# Patient Record
Sex: Female | Born: 1960
Health system: Southern US, Community
[De-identification: ages and names within clinical notes are randomized; demographics above are authoritative.]

## PROBLEM LIST (undated history)

## (undated) DIAGNOSIS — M4800 Spinal stenosis, site unspecified: Secondary | ICD-10-CM

## (undated) DIAGNOSIS — E785 Hyperlipidemia, unspecified: Secondary | ICD-10-CM

## (undated) DIAGNOSIS — H5410 Blindness, one eye, low vision other eye, unspecified eyes: Secondary | ICD-10-CM

## (undated) DIAGNOSIS — I10 Essential (primary) hypertension: Secondary | ICD-10-CM

## (undated) DIAGNOSIS — E039 Hypothyroidism, unspecified: Secondary | ICD-10-CM

## (undated) DIAGNOSIS — E1140 Type 2 diabetes mellitus with diabetic neuropathy, unspecified: Secondary | ICD-10-CM

## (undated) DIAGNOSIS — H547 Unspecified visual loss: Secondary | ICD-10-CM

## (undated) DIAGNOSIS — M1990 Unspecified osteoarthritis, unspecified site: Secondary | ICD-10-CM

## (undated) HISTORY — PX: BREAST BIOPSY: SHX20

---

## 1988-05-16 HISTORY — PX: ABDOMINAL HYSTERECTOMY: SHX81

## 1997-08-22 ENCOUNTER — Encounter: Admission: RE | Admit: 1997-08-22 | Discharge: 1997-08-22 | Payer: Self-pay | Admitting: Internal Medicine

## 1997-12-15 ENCOUNTER — Encounter: Admission: RE | Admit: 1997-12-15 | Discharge: 1997-12-15 | Payer: Self-pay | Admitting: Internal Medicine

## 1998-03-03 ENCOUNTER — Emergency Department (HOSPITAL_COMMUNITY): Admission: EM | Admit: 1998-03-03 | Discharge: 1998-03-03 | Payer: Self-pay | Admitting: Emergency Medicine

## 1998-03-18 ENCOUNTER — Ambulatory Visit (HOSPITAL_COMMUNITY): Admission: RE | Admit: 1998-03-18 | Discharge: 1998-03-18 | Payer: Self-pay | Admitting: Hematology and Oncology

## 1998-03-18 ENCOUNTER — Encounter: Admission: RE | Admit: 1998-03-18 | Discharge: 1998-03-18 | Payer: Self-pay | Admitting: Hematology and Oncology

## 1998-03-31 ENCOUNTER — Encounter: Admission: RE | Admit: 1998-03-31 | Discharge: 1998-03-31 | Payer: Self-pay | Admitting: Internal Medicine

## 1998-08-22 ENCOUNTER — Emergency Department (HOSPITAL_COMMUNITY): Admission: EM | Admit: 1998-08-22 | Discharge: 1998-08-22 | Payer: Self-pay | Admitting: *Deleted

## 1998-08-23 ENCOUNTER — Encounter: Payer: Self-pay | Admitting: Emergency Medicine

## 1998-08-25 ENCOUNTER — Encounter: Admission: RE | Admit: 1998-08-25 | Discharge: 1998-08-25 | Payer: Self-pay | Admitting: Hematology and Oncology

## 1998-09-01 ENCOUNTER — Encounter: Admission: RE | Admit: 1998-09-01 | Discharge: 1998-09-01 | Payer: Self-pay | Admitting: Internal Medicine

## 1998-09-08 ENCOUNTER — Encounter: Admission: RE | Admit: 1998-09-08 | Discharge: 1998-09-08 | Payer: Self-pay | Admitting: Hematology and Oncology

## 1998-09-15 HISTORY — PX: ENUCLEATION: SHX628

## 1998-10-20 ENCOUNTER — Encounter: Admission: RE | Admit: 1998-10-20 | Discharge: 1998-10-20 | Payer: Self-pay | Admitting: Internal Medicine

## 1998-10-23 ENCOUNTER — Encounter: Admission: RE | Admit: 1998-10-23 | Discharge: 1998-10-23 | Payer: Self-pay | Admitting: Internal Medicine

## 1998-11-05 ENCOUNTER — Encounter: Admission: RE | Admit: 1998-11-05 | Discharge: 1999-02-03 | Payer: Self-pay | Admitting: *Deleted

## 1998-11-13 ENCOUNTER — Encounter: Admission: RE | Admit: 1998-11-13 | Discharge: 1998-11-13 | Payer: Self-pay | Admitting: Internal Medicine

## 1999-01-22 ENCOUNTER — Emergency Department (HOSPITAL_COMMUNITY): Admission: EM | Admit: 1999-01-22 | Discharge: 1999-01-22 | Payer: Self-pay | Admitting: Emergency Medicine

## 1999-03-17 ENCOUNTER — Encounter: Admission: RE | Admit: 1999-03-17 | Discharge: 1999-03-17 | Payer: Self-pay | Admitting: Hematology and Oncology

## 1999-03-24 ENCOUNTER — Encounter: Admission: RE | Admit: 1999-03-24 | Discharge: 1999-03-24 | Payer: Self-pay | Admitting: Internal Medicine

## 1999-04-22 ENCOUNTER — Emergency Department (HOSPITAL_COMMUNITY): Admission: EM | Admit: 1999-04-22 | Discharge: 1999-04-22 | Payer: Self-pay | Admitting: Emergency Medicine

## 1999-07-15 ENCOUNTER — Encounter: Payer: Self-pay | Admitting: Emergency Medicine

## 1999-07-15 ENCOUNTER — Emergency Department (HOSPITAL_COMMUNITY): Admission: EM | Admit: 1999-07-15 | Discharge: 1999-07-15 | Payer: Self-pay | Admitting: Emergency Medicine

## 1999-07-23 ENCOUNTER — Emergency Department (HOSPITAL_COMMUNITY): Admission: EM | Admit: 1999-07-23 | Discharge: 1999-07-23 | Payer: Self-pay | Admitting: Emergency Medicine

## 1999-07-23 ENCOUNTER — Encounter: Payer: Self-pay | Admitting: Emergency Medicine

## 1999-07-25 ENCOUNTER — Emergency Department (HOSPITAL_COMMUNITY): Admission: EM | Admit: 1999-07-25 | Discharge: 1999-07-25 | Payer: Self-pay | Admitting: Emergency Medicine

## 1999-08-06 ENCOUNTER — Encounter: Admission: RE | Admit: 1999-08-06 | Discharge: 1999-08-06 | Payer: Self-pay | Admitting: Hematology and Oncology

## 1999-08-10 ENCOUNTER — Emergency Department (HOSPITAL_COMMUNITY): Admission: EM | Admit: 1999-08-10 | Discharge: 1999-08-10 | Payer: Self-pay | Admitting: Emergency Medicine

## 1999-08-20 ENCOUNTER — Encounter: Admission: RE | Admit: 1999-08-20 | Discharge: 1999-08-20 | Payer: Self-pay | Admitting: Internal Medicine

## 1999-09-15 ENCOUNTER — Emergency Department (HOSPITAL_COMMUNITY): Admission: EM | Admit: 1999-09-15 | Discharge: 1999-09-15 | Payer: Self-pay | Admitting: Emergency Medicine

## 1999-10-07 ENCOUNTER — Emergency Department (HOSPITAL_COMMUNITY): Admission: EM | Admit: 1999-10-07 | Discharge: 1999-10-07 | Payer: Self-pay | Admitting: Emergency Medicine

## 1999-11-01 ENCOUNTER — Emergency Department (HOSPITAL_COMMUNITY): Admission: EM | Admit: 1999-11-01 | Discharge: 1999-11-01 | Payer: Self-pay | Admitting: Emergency Medicine

## 1999-11-01 ENCOUNTER — Encounter: Payer: Self-pay | Admitting: Emergency Medicine

## 1999-11-29 ENCOUNTER — Ambulatory Visit (HOSPITAL_COMMUNITY): Admission: RE | Admit: 1999-11-29 | Discharge: 1999-11-30 | Payer: Self-pay | Admitting: Ophthalmology

## 1999-12-18 ENCOUNTER — Encounter: Payer: Self-pay | Admitting: Emergency Medicine

## 1999-12-18 ENCOUNTER — Emergency Department (HOSPITAL_COMMUNITY): Admission: EM | Admit: 1999-12-18 | Discharge: 1999-12-18 | Payer: Self-pay | Admitting: Emergency Medicine

## 2000-01-13 ENCOUNTER — Encounter: Admission: RE | Admit: 2000-01-13 | Discharge: 2000-01-13 | Payer: Self-pay | Admitting: Hematology and Oncology

## 2000-01-23 ENCOUNTER — Emergency Department (HOSPITAL_COMMUNITY): Admission: EM | Admit: 2000-01-23 | Discharge: 2000-01-23 | Payer: Self-pay | Admitting: Emergency Medicine

## 2000-01-31 ENCOUNTER — Encounter: Admission: RE | Admit: 2000-01-31 | Discharge: 2000-04-30 | Payer: Self-pay | Admitting: *Deleted

## 2000-02-08 ENCOUNTER — Encounter: Admission: RE | Admit: 2000-02-08 | Discharge: 2000-02-08 | Payer: Self-pay | Admitting: Internal Medicine

## 2000-02-18 ENCOUNTER — Emergency Department (HOSPITAL_COMMUNITY): Admission: EM | Admit: 2000-02-18 | Discharge: 2000-02-18 | Payer: Self-pay

## 2000-03-06 ENCOUNTER — Ambulatory Visit (HOSPITAL_COMMUNITY): Admission: RE | Admit: 2000-03-06 | Discharge: 2000-03-07 | Payer: Self-pay | Admitting: Ophthalmology

## 2000-03-20 ENCOUNTER — Ambulatory Visit (HOSPITAL_COMMUNITY): Admission: RE | Admit: 2000-03-20 | Discharge: 2000-03-21 | Payer: Self-pay | Admitting: Ophthalmology

## 2000-04-12 ENCOUNTER — Ambulatory Visit (HOSPITAL_COMMUNITY): Admission: RE | Admit: 2000-04-12 | Discharge: 2000-04-12 | Payer: Self-pay | Admitting: Hematology and Oncology

## 2000-04-12 ENCOUNTER — Encounter: Payer: Self-pay | Admitting: Hematology and Oncology

## 2000-04-12 ENCOUNTER — Encounter: Admission: RE | Admit: 2000-04-12 | Discharge: 2000-04-12 | Payer: Self-pay | Admitting: Hematology and Oncology

## 2000-05-16 ENCOUNTER — Emergency Department (HOSPITAL_COMMUNITY): Admission: EM | Admit: 2000-05-16 | Discharge: 2000-05-17 | Payer: Self-pay | Admitting: Emergency Medicine

## 2000-05-31 ENCOUNTER — Emergency Department (HOSPITAL_COMMUNITY): Admission: EM | Admit: 2000-05-31 | Discharge: 2000-06-01 | Payer: Self-pay | Admitting: Emergency Medicine

## 2000-06-01 ENCOUNTER — Encounter: Payer: Self-pay | Admitting: Emergency Medicine

## 2000-06-13 ENCOUNTER — Encounter: Admission: RE | Admit: 2000-06-13 | Discharge: 2000-06-13 | Payer: Self-pay | Admitting: Internal Medicine

## 2000-07-04 ENCOUNTER — Ambulatory Visit (HOSPITAL_COMMUNITY): Admission: RE | Admit: 2000-07-04 | Discharge: 2000-07-04 | Payer: Self-pay

## 2000-07-04 ENCOUNTER — Encounter: Admission: RE | Admit: 2000-07-04 | Discharge: 2000-07-04 | Payer: Self-pay

## 2000-08-30 ENCOUNTER — Emergency Department (HOSPITAL_COMMUNITY): Admission: EM | Admit: 2000-08-30 | Discharge: 2000-08-30 | Payer: Self-pay

## 2000-09-05 ENCOUNTER — Encounter: Admission: RE | Admit: 2000-09-05 | Discharge: 2000-09-05 | Payer: Self-pay | Admitting: Internal Medicine

## 2000-09-07 ENCOUNTER — Other Ambulatory Visit: Admission: RE | Admit: 2000-09-07 | Discharge: 2000-09-07 | Payer: Self-pay | Admitting: Obstetrics

## 2000-09-07 ENCOUNTER — Encounter: Admission: RE | Admit: 2000-09-07 | Discharge: 2000-09-07 | Payer: Self-pay | Admitting: Obstetrics

## 2000-09-19 ENCOUNTER — Ambulatory Visit (HOSPITAL_COMMUNITY): Admission: RE | Admit: 2000-09-19 | Discharge: 2000-09-19 | Payer: Self-pay

## 2000-12-25 ENCOUNTER — Encounter: Admission: RE | Admit: 2000-12-25 | Discharge: 2000-12-25 | Payer: Self-pay | Admitting: Internal Medicine

## 2000-12-26 ENCOUNTER — Encounter: Admission: RE | Admit: 2000-12-26 | Discharge: 2000-12-26 | Payer: Self-pay | Admitting: Internal Medicine

## 2000-12-28 ENCOUNTER — Encounter: Admission: RE | Admit: 2000-12-28 | Discharge: 2000-12-28 | Payer: Self-pay | Admitting: Internal Medicine

## 2001-01-11 ENCOUNTER — Encounter: Admission: RE | Admit: 2001-01-11 | Discharge: 2001-01-11 | Payer: Self-pay | Admitting: Internal Medicine

## 2001-01-12 ENCOUNTER — Ambulatory Visit: Admission: RE | Admit: 2001-01-12 | Discharge: 2001-01-12 | Payer: Self-pay

## 2001-01-17 ENCOUNTER — Encounter: Admission: RE | Admit: 2001-01-17 | Discharge: 2001-01-17 | Payer: Self-pay | Admitting: Internal Medicine

## 2001-03-09 ENCOUNTER — Emergency Department (HOSPITAL_COMMUNITY): Admission: EM | Admit: 2001-03-09 | Discharge: 2001-03-10 | Payer: Self-pay | Admitting: Emergency Medicine

## 2001-04-17 ENCOUNTER — Encounter: Admission: RE | Admit: 2001-04-17 | Discharge: 2001-04-17 | Payer: Self-pay

## 2001-05-03 ENCOUNTER — Encounter: Admission: RE | Admit: 2001-05-03 | Discharge: 2001-05-03 | Payer: Self-pay | Admitting: Internal Medicine

## 2001-07-22 ENCOUNTER — Emergency Department (HOSPITAL_COMMUNITY): Admission: EM | Admit: 2001-07-22 | Discharge: 2001-07-22 | Payer: Self-pay | Admitting: Emergency Medicine

## 2001-07-24 ENCOUNTER — Encounter: Admission: RE | Admit: 2001-07-24 | Discharge: 2001-07-24 | Payer: Self-pay | Admitting: Internal Medicine

## 2001-07-27 ENCOUNTER — Encounter: Admission: RE | Admit: 2001-07-27 | Discharge: 2001-07-27 | Payer: Self-pay | Admitting: Internal Medicine

## 2001-08-13 ENCOUNTER — Ambulatory Visit (HOSPITAL_COMMUNITY): Admission: RE | Admit: 2001-08-13 | Discharge: 2001-08-14 | Payer: Self-pay | Admitting: Ophthalmology

## 2001-08-13 ENCOUNTER — Encounter: Payer: Self-pay | Admitting: Ophthalmology

## 2001-09-25 ENCOUNTER — Encounter: Admission: RE | Admit: 2001-09-25 | Discharge: 2001-09-25 | Payer: Self-pay | Admitting: Internal Medicine

## 2001-09-25 ENCOUNTER — Ambulatory Visit (HOSPITAL_COMMUNITY): Admission: RE | Admit: 2001-09-25 | Discharge: 2001-09-25 | Payer: Self-pay | Admitting: Internal Medicine

## 2001-09-25 ENCOUNTER — Encounter: Payer: Self-pay | Admitting: Internal Medicine

## 2001-09-28 ENCOUNTER — Ambulatory Visit (HOSPITAL_COMMUNITY): Admission: RE | Admit: 2001-09-28 | Discharge: 2001-09-28 | Payer: Self-pay | Admitting: Internal Medicine

## 2001-10-09 ENCOUNTER — Encounter: Admission: RE | Admit: 2001-10-09 | Discharge: 2001-10-09 | Payer: Self-pay | Admitting: Internal Medicine

## 2001-10-09 ENCOUNTER — Ambulatory Visit (HOSPITAL_COMMUNITY): Admission: RE | Admit: 2001-10-09 | Discharge: 2001-10-09 | Payer: Self-pay | Admitting: Internal Medicine

## 2001-10-15 ENCOUNTER — Ambulatory Visit: Admission: RE | Admit: 2001-10-15 | Discharge: 2001-10-15 | Payer: Self-pay | Admitting: Internal Medicine

## 2001-10-15 ENCOUNTER — Encounter: Payer: Self-pay | Admitting: Cardiology

## 2001-10-19 ENCOUNTER — Encounter: Admission: RE | Admit: 2001-10-19 | Discharge: 2001-10-19 | Payer: Self-pay | Admitting: Internal Medicine

## 2001-10-22 ENCOUNTER — Ambulatory Visit (HOSPITAL_COMMUNITY): Admission: RE | Admit: 2001-10-22 | Discharge: 2001-10-22 | Payer: Self-pay | Admitting: Internal Medicine

## 2001-10-22 ENCOUNTER — Encounter: Payer: Self-pay | Admitting: Internal Medicine

## 2001-10-25 ENCOUNTER — Encounter: Admission: RE | Admit: 2001-10-25 | Discharge: 2001-10-25 | Payer: Self-pay | Admitting: Internal Medicine

## 2001-10-30 ENCOUNTER — Encounter: Payer: Self-pay | Admitting: Internal Medicine

## 2001-10-30 ENCOUNTER — Emergency Department (HOSPITAL_COMMUNITY): Admission: EM | Admit: 2001-10-30 | Discharge: 2001-10-31 | Payer: Self-pay | Admitting: *Deleted

## 2001-11-05 ENCOUNTER — Ambulatory Visit (HOSPITAL_COMMUNITY): Admission: RE | Admit: 2001-11-05 | Discharge: 2001-11-05 | Payer: Self-pay | Admitting: Internal Medicine

## 2001-11-14 ENCOUNTER — Encounter: Admission: RE | Admit: 2001-11-14 | Discharge: 2001-11-14 | Payer: Self-pay | Admitting: Internal Medicine

## 2001-11-27 ENCOUNTER — Encounter: Admission: RE | Admit: 2001-11-27 | Discharge: 2001-11-27 | Payer: Self-pay | Admitting: *Deleted

## 2001-11-27 ENCOUNTER — Other Ambulatory Visit: Admission: RE | Admit: 2001-11-27 | Discharge: 2001-11-27 | Payer: Self-pay | Admitting: *Deleted

## 2001-12-14 ENCOUNTER — Inpatient Hospital Stay (HOSPITAL_COMMUNITY): Admission: EM | Admit: 2001-12-14 | Discharge: 2001-12-17 | Payer: Self-pay | Admitting: *Deleted

## 2001-12-14 ENCOUNTER — Encounter: Payer: Self-pay | Admitting: *Deleted

## 2001-12-16 ENCOUNTER — Encounter: Payer: Self-pay | Admitting: Internal Medicine

## 2001-12-26 ENCOUNTER — Encounter: Admission: RE | Admit: 2001-12-26 | Discharge: 2001-12-26 | Payer: Self-pay | Admitting: Internal Medicine

## 2002-01-01 ENCOUNTER — Encounter: Admission: RE | Admit: 2002-01-01 | Discharge: 2002-01-01 | Payer: Self-pay | Admitting: Obstetrics and Gynecology

## 2002-01-01 ENCOUNTER — Encounter: Admission: RE | Admit: 2002-01-01 | Discharge: 2002-01-01 | Payer: Self-pay | Admitting: Internal Medicine

## 2002-01-09 ENCOUNTER — Ambulatory Visit (HOSPITAL_COMMUNITY): Admission: RE | Admit: 2002-01-09 | Discharge: 2002-01-09 | Payer: Self-pay | Admitting: Obstetrics and Gynecology

## 2002-01-16 ENCOUNTER — Encounter: Admission: RE | Admit: 2002-01-16 | Discharge: 2002-01-16 | Payer: Self-pay | Admitting: Internal Medicine

## 2002-01-20 ENCOUNTER — Emergency Department (HOSPITAL_COMMUNITY): Admission: EM | Admit: 2002-01-20 | Discharge: 2002-01-20 | Payer: Self-pay

## 2002-03-11 ENCOUNTER — Encounter: Admission: RE | Admit: 2002-03-11 | Discharge: 2002-03-11 | Payer: Self-pay | Admitting: Internal Medicine

## 2002-03-21 ENCOUNTER — Emergency Department (HOSPITAL_COMMUNITY): Admission: EM | Admit: 2002-03-21 | Discharge: 2002-03-21 | Payer: Self-pay

## 2002-04-25 ENCOUNTER — Ambulatory Visit (HOSPITAL_COMMUNITY): Admission: RE | Admit: 2002-04-25 | Discharge: 2002-04-25 | Payer: Self-pay | Admitting: Internal Medicine

## 2002-05-15 ENCOUNTER — Encounter: Admission: RE | Admit: 2002-05-15 | Discharge: 2002-05-15 | Payer: Self-pay | Admitting: Internal Medicine

## 2002-05-29 ENCOUNTER — Encounter: Admission: RE | Admit: 2002-05-29 | Discharge: 2002-08-27 | Payer: Self-pay | Admitting: *Deleted

## 2002-05-31 ENCOUNTER — Emergency Department (HOSPITAL_COMMUNITY): Admission: EM | Admit: 2002-05-31 | Discharge: 2002-05-31 | Payer: Self-pay | Admitting: Emergency Medicine

## 2002-06-28 ENCOUNTER — Encounter: Admission: RE | Admit: 2002-06-28 | Discharge: 2002-06-28 | Payer: Self-pay | Admitting: Internal Medicine

## 2002-07-26 ENCOUNTER — Emergency Department (HOSPITAL_COMMUNITY): Admission: EM | Admit: 2002-07-26 | Discharge: 2002-07-27 | Payer: Self-pay | Admitting: Emergency Medicine

## 2002-08-01 ENCOUNTER — Encounter: Admission: RE | Admit: 2002-08-01 | Discharge: 2002-08-01 | Payer: Self-pay | Admitting: Internal Medicine

## 2002-08-12 ENCOUNTER — Encounter: Admission: RE | Admit: 2002-08-12 | Discharge: 2002-08-12 | Payer: Self-pay | Admitting: Internal Medicine

## 2002-09-09 ENCOUNTER — Encounter: Admission: RE | Admit: 2002-09-09 | Discharge: 2002-09-09 | Payer: Self-pay | Admitting: Internal Medicine

## 2002-09-16 ENCOUNTER — Encounter: Admission: RE | Admit: 2002-09-16 | Discharge: 2002-09-16 | Payer: Self-pay | Admitting: Internal Medicine

## 2002-10-15 ENCOUNTER — Encounter: Admission: RE | Admit: 2002-10-15 | Discharge: 2002-10-15 | Payer: Self-pay | Admitting: Internal Medicine

## 2002-10-28 ENCOUNTER — Encounter: Admission: RE | Admit: 2002-10-28 | Discharge: 2002-10-28 | Payer: Self-pay | Admitting: Internal Medicine

## 2002-10-29 ENCOUNTER — Encounter: Admission: RE | Admit: 2002-10-29 | Discharge: 2002-10-29 | Payer: Self-pay | Admitting: Internal Medicine

## 2002-11-12 ENCOUNTER — Encounter
Admission: RE | Admit: 2002-11-12 | Discharge: 2002-11-13 | Payer: Self-pay | Admitting: Physical Medicine & Rehabilitation

## 2002-11-14 ENCOUNTER — Encounter: Admission: RE | Admit: 2002-11-14 | Discharge: 2003-02-12 | Payer: Self-pay | Admitting: *Deleted

## 2002-12-12 ENCOUNTER — Emergency Department (HOSPITAL_COMMUNITY): Admission: EM | Admit: 2002-12-12 | Discharge: 2002-12-12 | Payer: Self-pay | Admitting: Emergency Medicine

## 2002-12-21 ENCOUNTER — Emergency Department (HOSPITAL_COMMUNITY): Admission: EM | Admit: 2002-12-21 | Discharge: 2002-12-21 | Payer: Self-pay | Admitting: Emergency Medicine

## 2002-12-21 ENCOUNTER — Encounter: Payer: Self-pay | Admitting: Emergency Medicine

## 2003-03-22 ENCOUNTER — Emergency Department (HOSPITAL_COMMUNITY): Admission: EM | Admit: 2003-03-22 | Discharge: 2003-03-22 | Payer: Self-pay | Admitting: Emergency Medicine

## 2003-06-30 ENCOUNTER — Encounter: Admission: RE | Admit: 2003-06-30 | Discharge: 2003-06-30 | Payer: Self-pay | Admitting: Internal Medicine

## 2003-08-05 ENCOUNTER — Encounter: Admission: RE | Admit: 2003-08-05 | Discharge: 2003-08-05 | Payer: Self-pay | Admitting: Internal Medicine

## 2003-08-23 ENCOUNTER — Emergency Department (HOSPITAL_COMMUNITY): Admission: AD | Admit: 2003-08-23 | Discharge: 2003-08-23 | Payer: Self-pay | Admitting: Family Medicine

## 2003-09-18 ENCOUNTER — Other Ambulatory Visit: Admission: RE | Admit: 2003-09-18 | Discharge: 2003-09-18 | Payer: Self-pay | Admitting: Obstetrics and Gynecology

## 2003-09-22 ENCOUNTER — Encounter: Admission: RE | Admit: 2003-09-22 | Discharge: 2003-09-22 | Payer: Self-pay | Admitting: Obstetrics and Gynecology

## 2003-11-03 ENCOUNTER — Encounter (INDEPENDENT_AMBULATORY_CARE_PROVIDER_SITE_OTHER): Payer: Self-pay | Admitting: Specialist

## 2003-11-03 ENCOUNTER — Inpatient Hospital Stay (HOSPITAL_COMMUNITY): Admission: RE | Admit: 2003-11-03 | Discharge: 2003-11-08 | Payer: Self-pay | Admitting: Obstetrics and Gynecology

## 2004-01-17 IMAGING — CT CT ABDOMEN W/ CM
1 of 2 series · 13 of 32 positions shown, 18 images · IV contrast (150 ML OMNI 300)
Comparison: none

FINDINGS
CLINICAL DATA: CHEST PAIN.
CT OF THE CHEST, ABDOMEN AND PELVIS WITH CONTRAST
NO COMPARISON.
AFTER THE INTRAVENOUS INJECTION OF 667cc OF OMNIPAQUE 300, SPIRAL IMAGES WERE OBTAINED THROUGH THE
CHEST, ABDOMEN AND PELVIS.  THE PATIENT HAS A HISTORY OF SEVERE DEBILITATION.
CT OF THE CHEST - 12/14/2001 - (1501 HOURS)
WITHIN THE CHEST, THE VASCULATURE IS WITHIN NORMAL LIMITS.  THERE IS NO EVIDENCE OF AN AORTIC
DISSECTION.  THE HEART IS NORMAL IN SIZE.  WITHIN THE MEDIASTINUM, THERE IS NO EVIDENCE OF ABNORMAL
ADENOPATHY.
ON LUNG WINDOWS, NO ABNORMAL PARENCHYMAL OPACITIES ARE IDENTIFIED.  NO PNEUMOTHORACES OR EFFUSIONS
ARE SEEN.  A CALCIFICATION IS SEEN IN THE RIGHT BREAST, PROBABLY REFLECTING A FIBROADENOMA.
IMPRESSION
NO EVIDENCE OF THORACIC AORTIC DISSECTION.
CT OF THE ABDOMEN - 12/14/2001 - (1501 HOURS)
THE LEFT HEPATIC ARTERY IS SEEN EXTENDING FROM THE LEFT GASTRIC INTO THE LIGAMENTUM VENOSUM REGION,
COMPATIBLE WITH NORMAL VARIATION OF THE LEFT HEPATIC ARTERY.  THE LIVER IS WITHIN NORMAL LIMITS.
THE SPLEEN, PANCREAS, KIDNEYS AND ADRENAL GLANDS ARE WITHIN NORMAL LIMITS.
THERE IS NO EVIDENCE OF FREE FLUID OR ADENOPATHY IN THE ABDOMEN.
ON THE ARTERIAL PHASE IMAGES, SWELLING OF  CONTRAST MIXED WITH LOW DENSITY IS SEEN IN THE SPLENIC
VEIN.  THIS RESOLVES ON DELAYED IMAGES AND IS BELIEVED TO REPRESENT MIXING OF CONTRAST RATHER THAN
THROMBUS.
NO EVIDENCE OF ACUTE ABNORMALITY IN THE ABDOMEN.  VARIATION ARTERIAL ANATOMY OF THE LIVER IS NOTED.
CT OF THE  PELVIS - 12/14/2001 - (1501 HOURS)
A LARGE HYPERVASCULAR MASS IS SEEN IN THE LEFT LOWER QUADRANT OF THE ABDOMEN THAT IS BELIEVED TO BE
IN CONTINUITY WITH THE UTERUS.  THIS PROBABLY REFLECTS A FIBROMATOID UTERUS.  THIS CAUSES MASS
EFFECT UPON THE BLADDER TO THE RIGHT.   THERE IS NO EVIDENCE OF URETERAL OBSTRUCTION.  THE BLADDER
IS DISTENDED.   THE VASCULATURE IS WITHIN NORMAL LIMITS WITHOUT EVIDENCE OF DISSECTION.  THERE IS
NO EVIDENCE OF FREE FLUID OR ADENOPATHY.
ON IMAGES #221 THROUGH #224, THERE IS SOME ASYMMETRIC ENLARGEMENT OF THE RIGHT PIRIFORMIS MUSCLE.
1.  NO EVIDENCE OF DISSECTION.
2.  LARGE PELVIC AND ABDOMINAL MASS AS DESCRIBED.  THIS IS BELIEVED TO REPRESENT AN ENLARGED
UTERUS.  HOWEVER, ULTRASOUND IS RECOMMENDED TO FURTHER DELINEATE PATHOLOGY.
3.  THERE IS SOME ASYMMETRY OF THE RIGHT PIRIFORMIS MUSCLE WITH ENLARGMENT ON THE RIGHT.  THIS MAY
SIMPLY REFLECT ASYMMETRIC HYPERTROPHY OF THE RIGHT PIRIFORMIS, MASS EFFECT CAN NOT BE EXCLUDED.
MRI CAN BE PERFORMED TO FURTHER DELINEATE PATHOLOGY.
THESE FINDINGS WERE DISCUSSED WITH DR. SOLTAN

[Series 2: aortic dissection · axial · 0.70mm/px · z∈[-471,-36]mm · 13 of 235 slices shown, 18 images]
[im 13/235  soft-tissue]
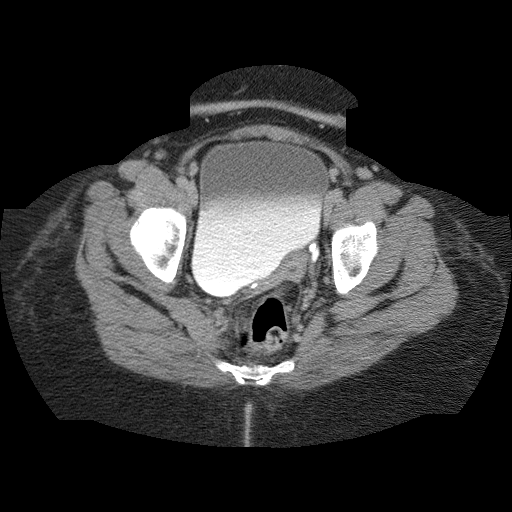
[im 13/235  bone]
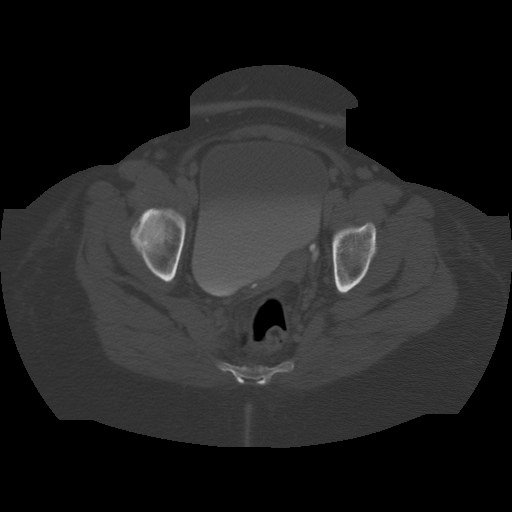
[im 37/235  soft-tissue]
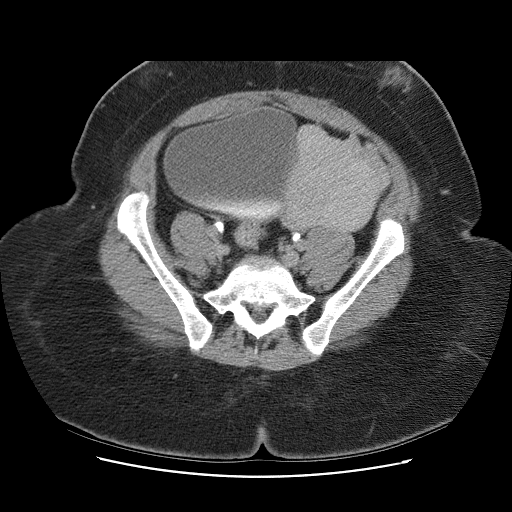
[im 50/235  soft-tissue]
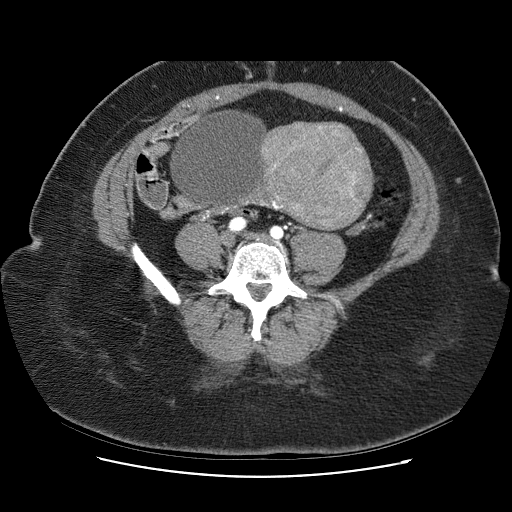
[im 74/235  soft-tissue]
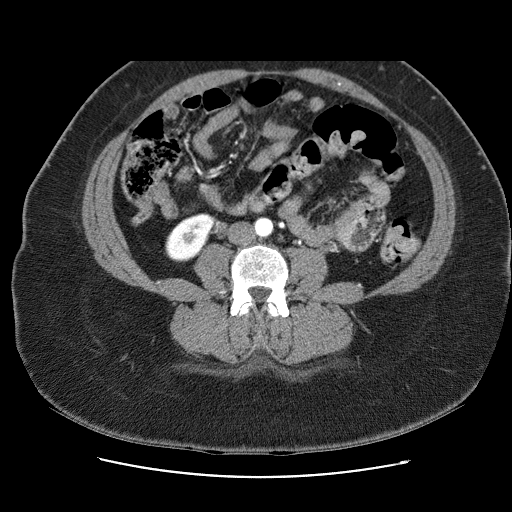
[im 87/235  soft-tissue]
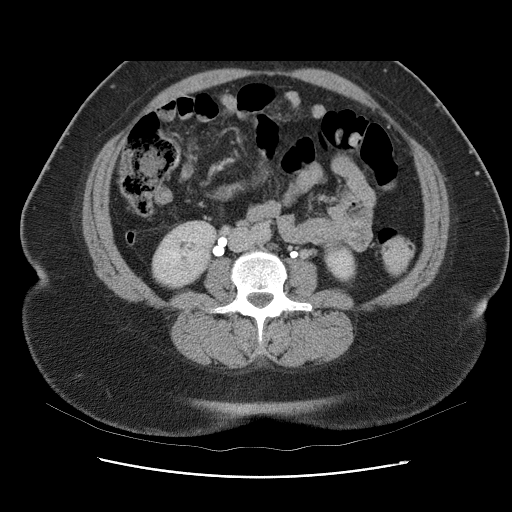
[im 111/235  soft-tissue]
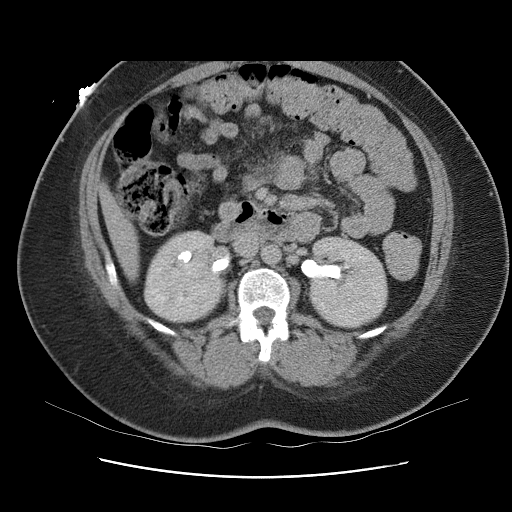
[im 124/235  soft-tissue]
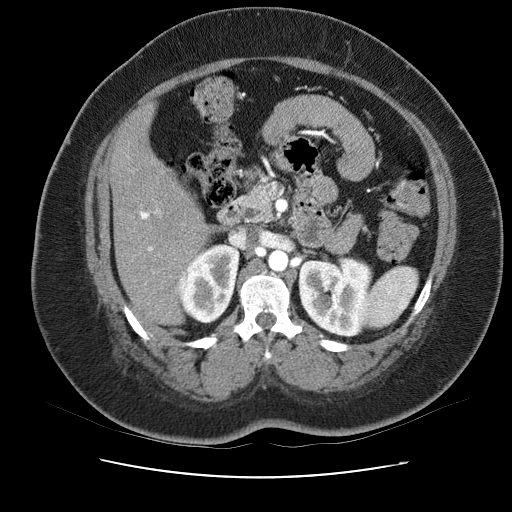
[im 148/235  soft-tissue]
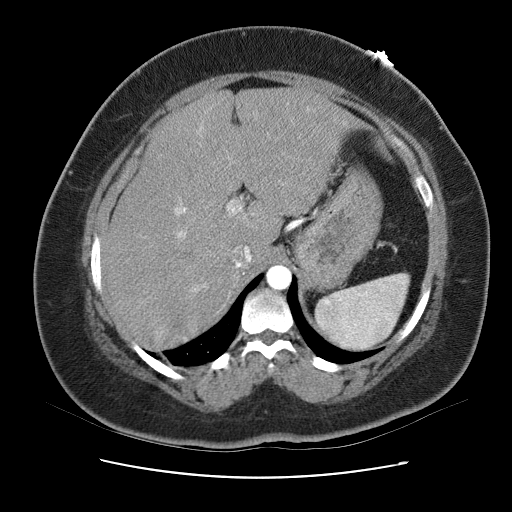
[im 161/235  soft-tissue]
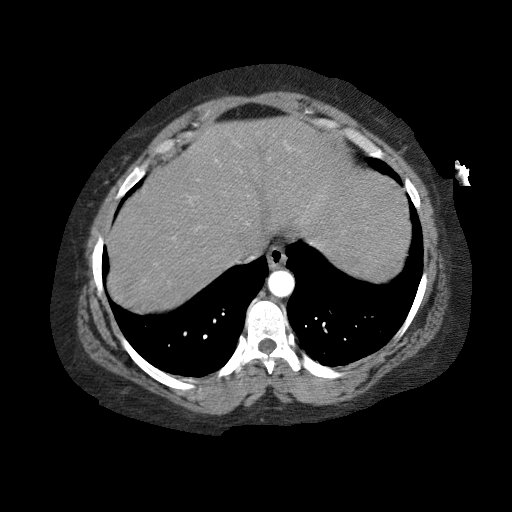
[im 161/235  bone]
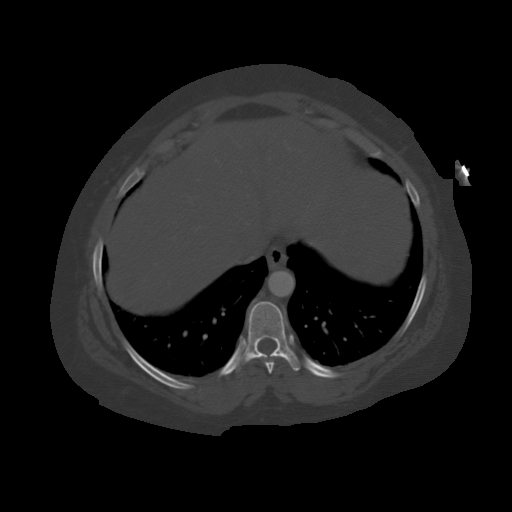
[im 185/235  soft-tissue]
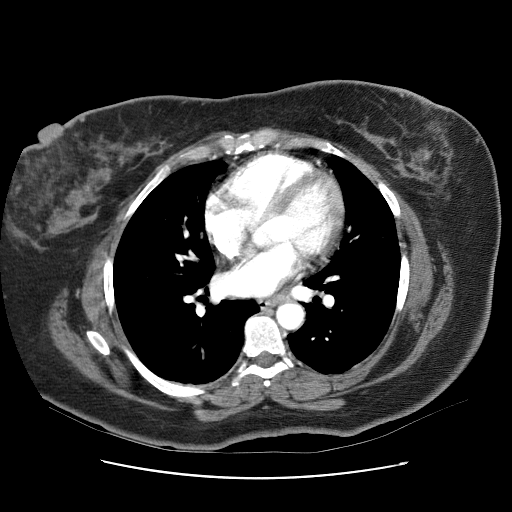
[im 185/235  lung]
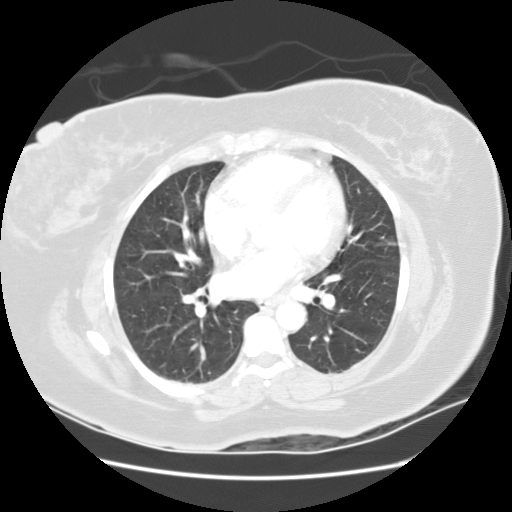
[im 198/235  soft-tissue]
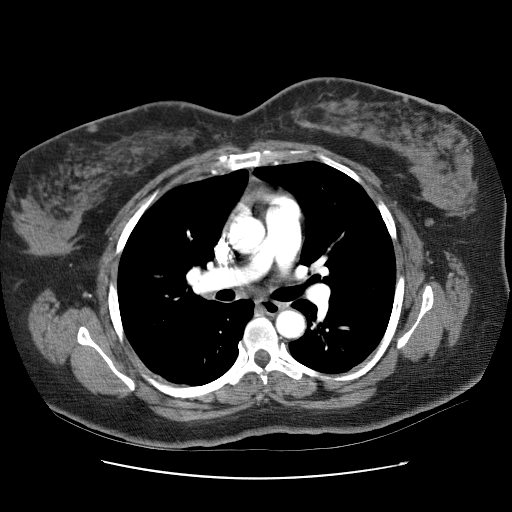
[im 198/235  lung]
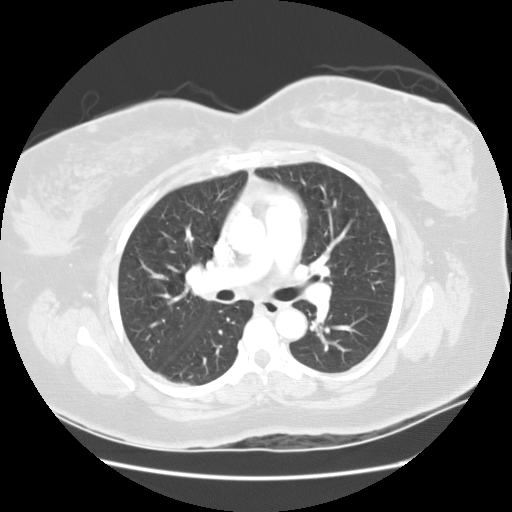
[im 210/235  lung]
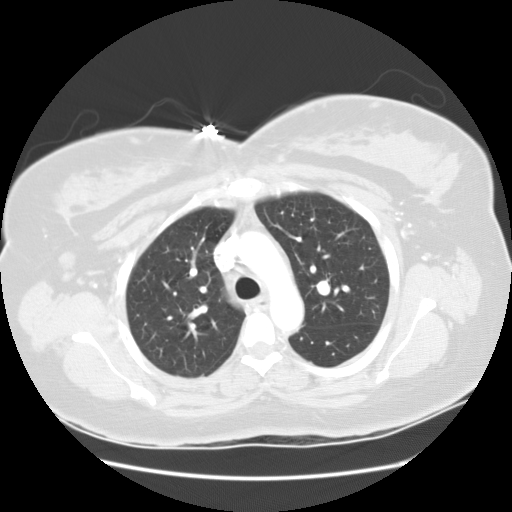
[im 222/235  soft-tissue]
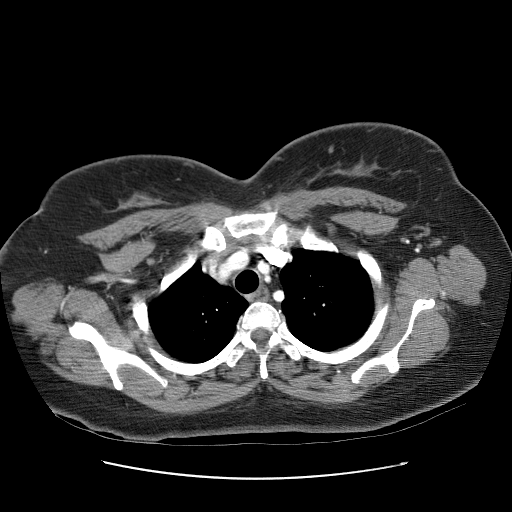
[im 222/235  lung]
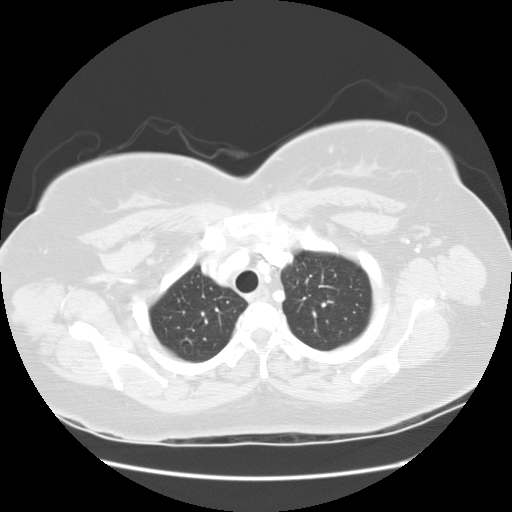

[13 of 32 positions shown; findings below may reference images not displayed]

## 2004-04-07 ENCOUNTER — Inpatient Hospital Stay (HOSPITAL_COMMUNITY): Admission: EM | Admit: 2004-04-07 | Discharge: 2004-04-14 | Payer: Self-pay | Admitting: Emergency Medicine

## 2004-06-26 ENCOUNTER — Emergency Department (HOSPITAL_COMMUNITY): Admission: EM | Admit: 2004-06-26 | Discharge: 2004-06-27 | Payer: Self-pay | Admitting: Emergency Medicine

## 2004-07-23 ENCOUNTER — Emergency Department (HOSPITAL_COMMUNITY): Admission: EM | Admit: 2004-07-23 | Discharge: 2004-07-23 | Payer: Self-pay | Admitting: Emergency Medicine

## 2004-08-02 ENCOUNTER — Encounter: Admission: RE | Admit: 2004-08-02 | Discharge: 2004-08-02 | Payer: Self-pay | Admitting: Internal Medicine

## 2004-09-14 ENCOUNTER — Emergency Department (HOSPITAL_COMMUNITY): Admission: EM | Admit: 2004-09-14 | Discharge: 2004-09-14 | Payer: Self-pay | Admitting: Family Medicine

## 2004-10-07 ENCOUNTER — Inpatient Hospital Stay (HOSPITAL_COMMUNITY): Admission: EM | Admit: 2004-10-07 | Discharge: 2004-10-12 | Payer: Self-pay | Admitting: Emergency Medicine

## 2004-11-01 ENCOUNTER — Encounter: Admission: RE | Admit: 2004-11-01 | Discharge: 2004-11-01 | Payer: Self-pay | Admitting: Family Medicine

## 2005-01-01 ENCOUNTER — Inpatient Hospital Stay (HOSPITAL_COMMUNITY): Admission: EM | Admit: 2005-01-01 | Discharge: 2005-01-03 | Payer: Self-pay | Admitting: Emergency Medicine

## 2005-01-26 ENCOUNTER — Encounter: Admission: RE | Admit: 2005-01-26 | Discharge: 2005-01-26 | Payer: Self-pay | Admitting: Family Medicine

## 2005-02-11 ENCOUNTER — Ambulatory Visit (HOSPITAL_COMMUNITY): Admission: RE | Admit: 2005-02-11 | Discharge: 2005-02-11 | Payer: Self-pay | Admitting: Gastroenterology

## 2005-02-12 ENCOUNTER — Emergency Department (HOSPITAL_COMMUNITY): Admission: EM | Admit: 2005-02-12 | Discharge: 2005-02-12 | Payer: Self-pay | Admitting: Family Medicine

## 2005-02-16 ENCOUNTER — Encounter: Admission: RE | Admit: 2005-02-16 | Discharge: 2005-05-17 | Payer: Self-pay | Admitting: Family Medicine

## 2005-03-07 ENCOUNTER — Emergency Department (HOSPITAL_COMMUNITY): Admission: EM | Admit: 2005-03-07 | Discharge: 2005-03-07 | Payer: Self-pay | Admitting: Emergency Medicine

## 2005-05-03 ENCOUNTER — Emergency Department (HOSPITAL_COMMUNITY): Admission: EM | Admit: 2005-05-03 | Discharge: 2005-05-03 | Payer: Self-pay | Admitting: Emergency Medicine

## 2005-09-07 ENCOUNTER — Emergency Department (HOSPITAL_COMMUNITY): Admission: EM | Admit: 2005-09-07 | Discharge: 2005-09-07 | Payer: Self-pay | Admitting: Emergency Medicine

## 2005-09-07 IMAGING — US US PELVIS COMPLETE MODIFY
1 series · 14 of 25 positions shown · non-contrast
Comparison: none

CLINICAL DATA: Pelvic pain and bloating.  History of fibroids.
 ULTRASOUND OF THE PELVIS COMPLETE TRANSABDOMINAL AND TRANSVAGINAL

[Series 1: unknown · 0.32mm/px · 14 of 30 slices shown]
[im 1/30]
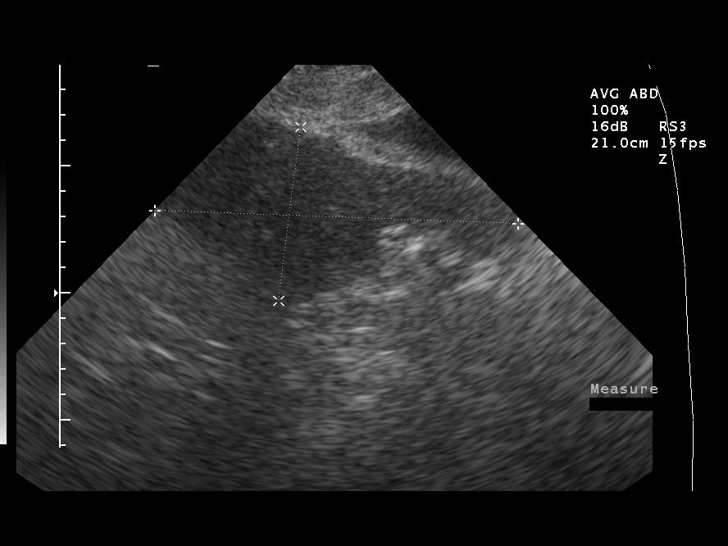
[im 3/30]
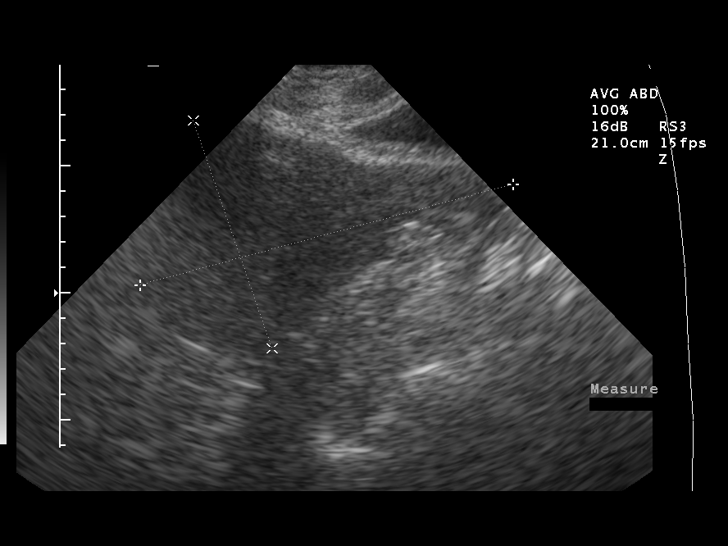
[im 5/30]
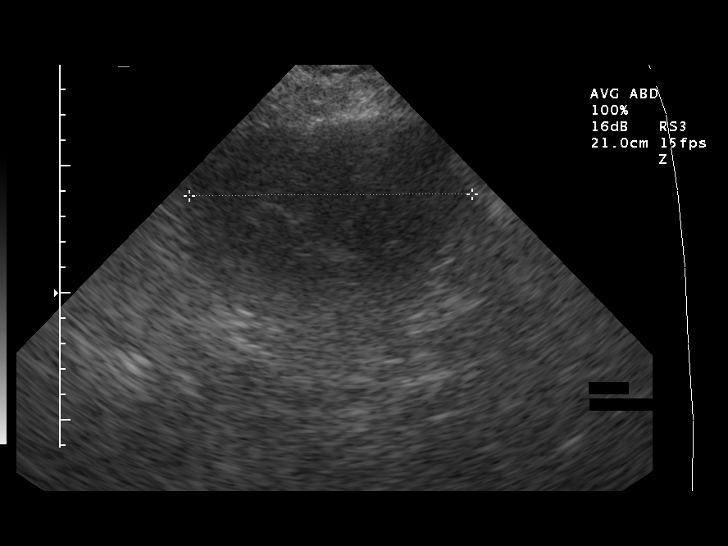
[im 8/30]
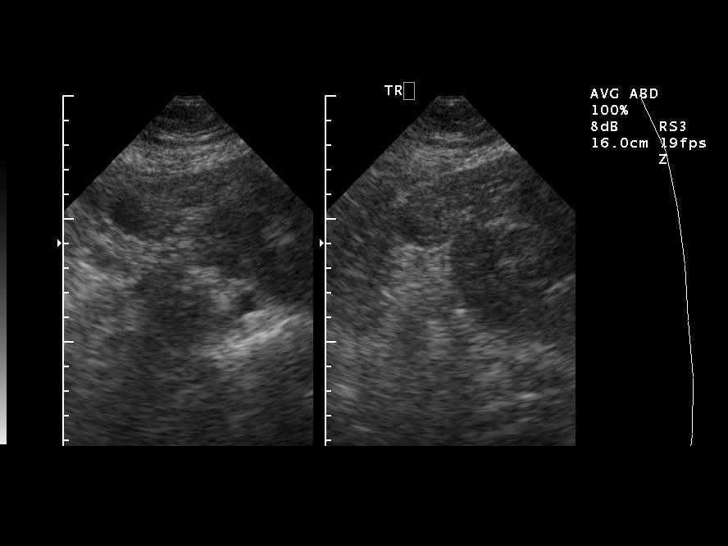
[im 10/30]
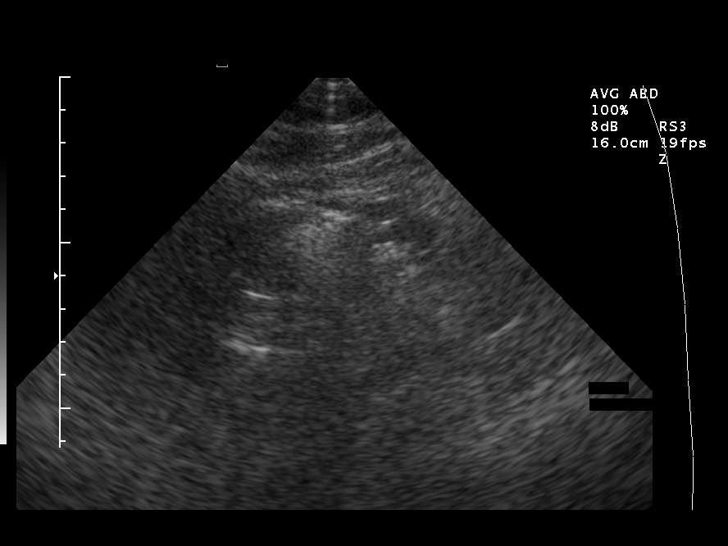
[im 11/30]
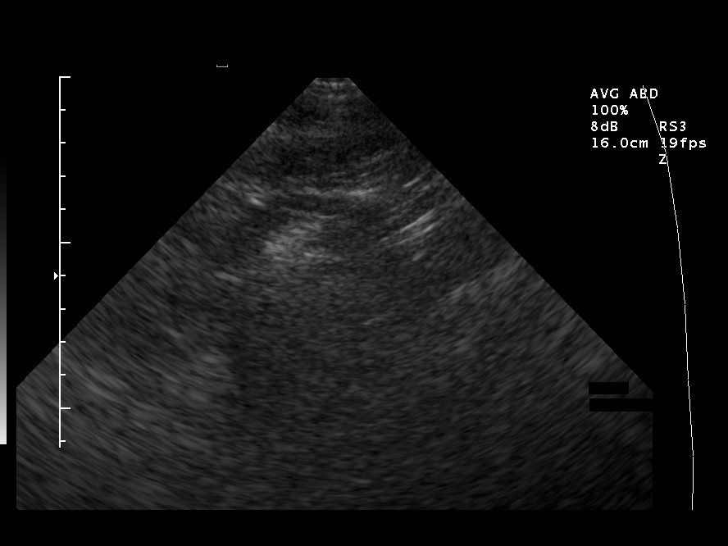
[im 14/30]
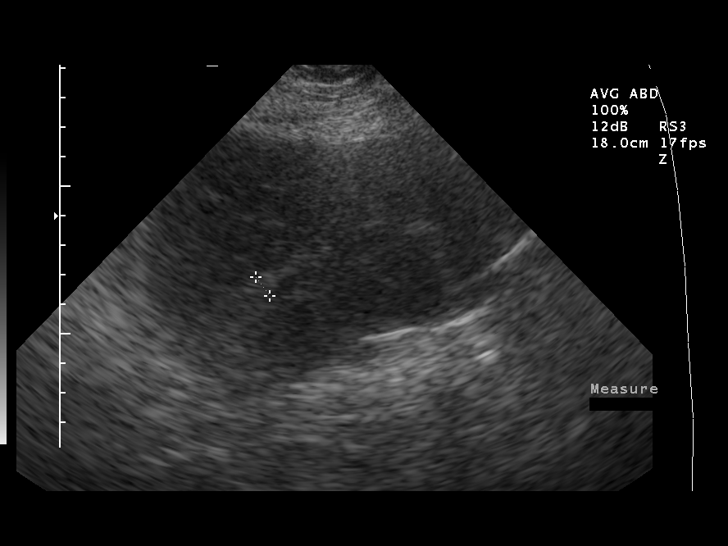
[im 16/30]
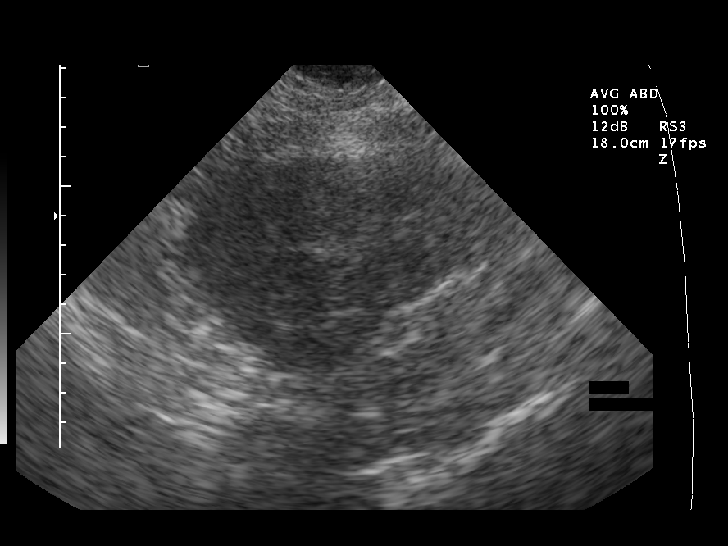
[im 19/30]
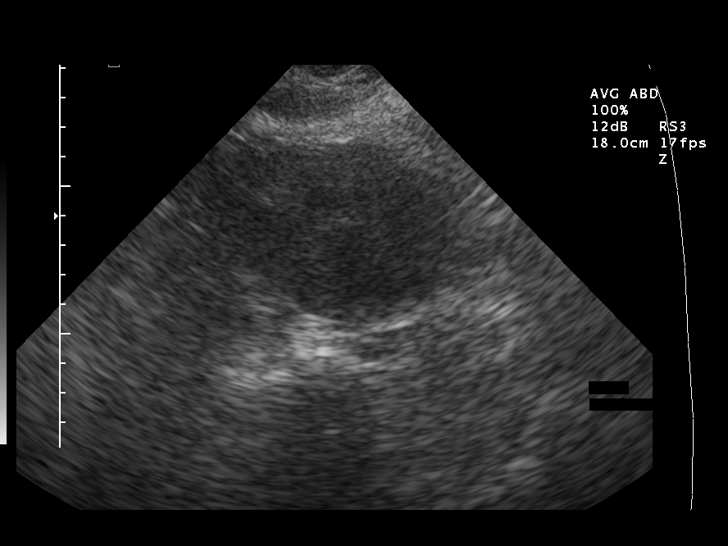
[im 20/30]
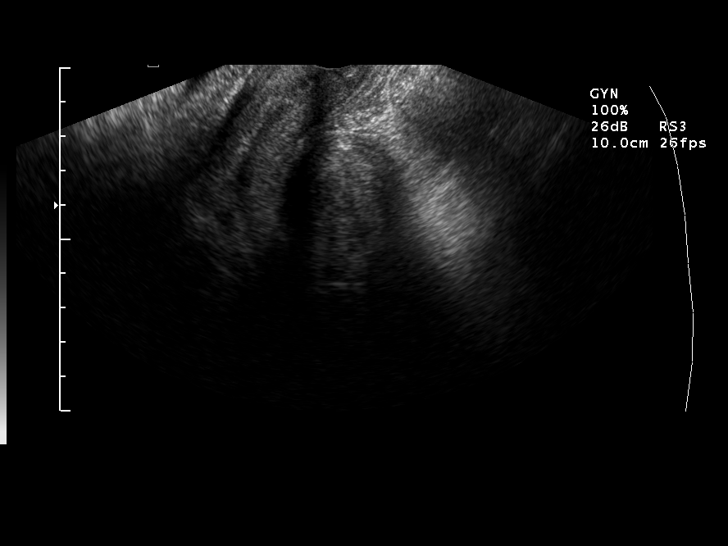
[im 22/30]
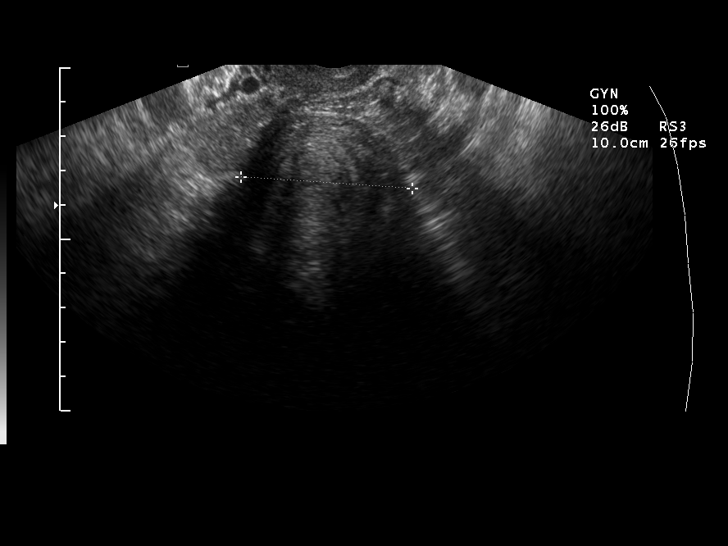
[im 25/30]
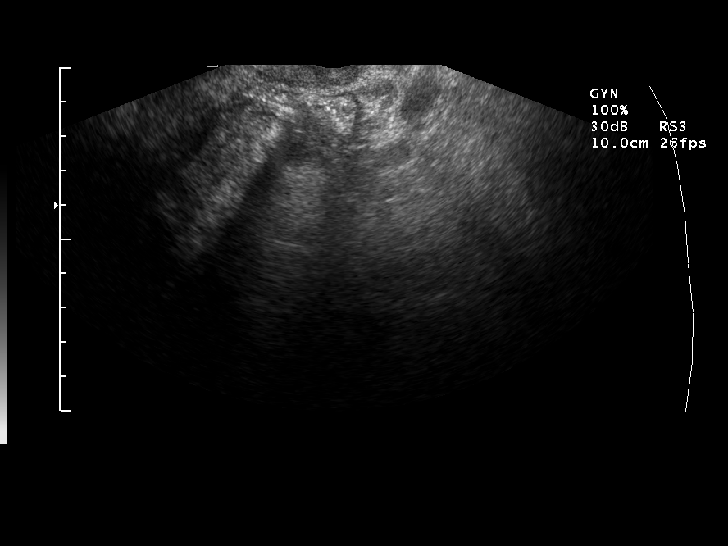
[im 27/30]
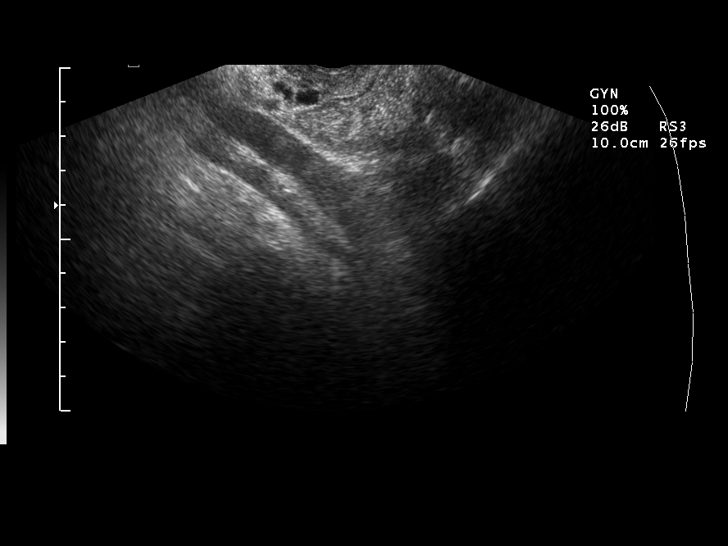
[im 30/30]
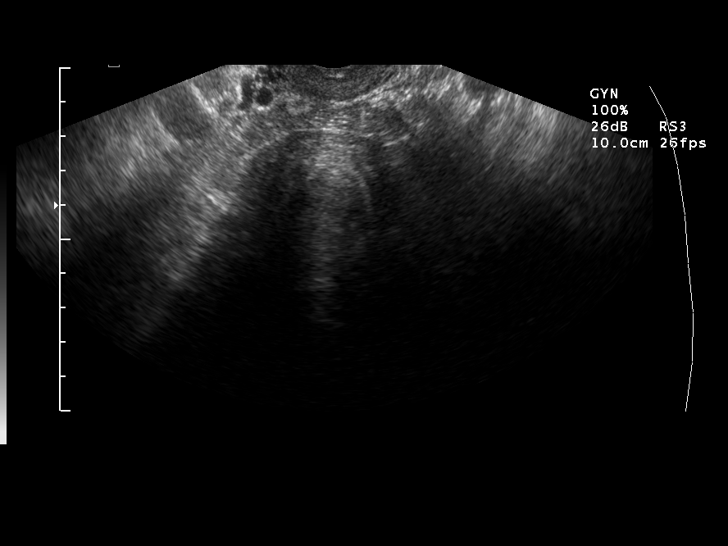

[14 of 25 positions shown; findings below may reference images not displayed]

FINDINGS: The uterus is slightly enlarged measuring 15.2 cm long x 9.5 cm AP x 11.1 cm wide.  Fundal endometrial strips is difficult to visualize yet in the visualized portion measures normally at 8 mm.  At the posterior lower uterine segment is a 5 cm likely exophytic fibroid.  The uterus is otherwise unremarkable.  No free fluid is seen.  The study is limited due to patient?s body habitus and gastrointestinal gas.  The right ovary appears sonographically normal measuring 5 cm long x 2.6 cm AP x 2.7 cm wide.  The left ovary is not identified. 
 IMPRESSION
 1.  Slightly enlarged uterus with likely posterior exophytic lower uterine segment fibroid measuring 5 cm.  
 2.  Technical limitations as described with nonvisualization of the left ovary.
 3.  Otherwise, no significant abnormality.

## 2005-10-08 ENCOUNTER — Emergency Department (HOSPITAL_COMMUNITY): Admission: EM | Admit: 2005-10-08 | Discharge: 2005-10-08 | Payer: Self-pay | Admitting: Family Medicine

## 2005-10-26 ENCOUNTER — Emergency Department (HOSPITAL_COMMUNITY): Admission: EM | Admit: 2005-10-26 | Discharge: 2005-10-26 | Payer: Self-pay | Admitting: Family Medicine

## 2005-11-23 ENCOUNTER — Emergency Department (HOSPITAL_COMMUNITY): Admission: EM | Admit: 2005-11-23 | Discharge: 2005-11-23 | Payer: Self-pay | Admitting: Emergency Medicine

## 2005-12-26 ENCOUNTER — Ambulatory Visit (HOSPITAL_COMMUNITY): Admission: RE | Admit: 2005-12-26 | Discharge: 2005-12-26 | Payer: Self-pay | Admitting: Ophthalmology

## 2006-04-14 ENCOUNTER — Emergency Department (HOSPITAL_COMMUNITY): Admission: EM | Admit: 2006-04-14 | Discharge: 2006-04-14 | Payer: Self-pay | Admitting: Family Medicine

## 2006-04-19 ENCOUNTER — Emergency Department (HOSPITAL_COMMUNITY): Admission: EM | Admit: 2006-04-19 | Discharge: 2006-04-19 | Payer: Self-pay | Admitting: Family Medicine

## 2006-05-13 IMAGING — CR DG CHEST 1V PORT
1 series · 1 of 1 positions shown · non-contrast
Comparison: none

CLINICAL DATA: PICC line placement.  Hypoglycemia.
 PORTABLE CHEST 1 VIEW ? 04/09/04 AT 5359 HOURS:
 The PICC line enters via the right upper extremity vein approach and the tip is in the distal SVC.  The lungs are clear of an active process.  The pulmonary vessels are slightly prominent in caliber however, there is no evidence of pulmonary edema.  Unremarkable cardiomediastinal silhouette.

[view not recorded]
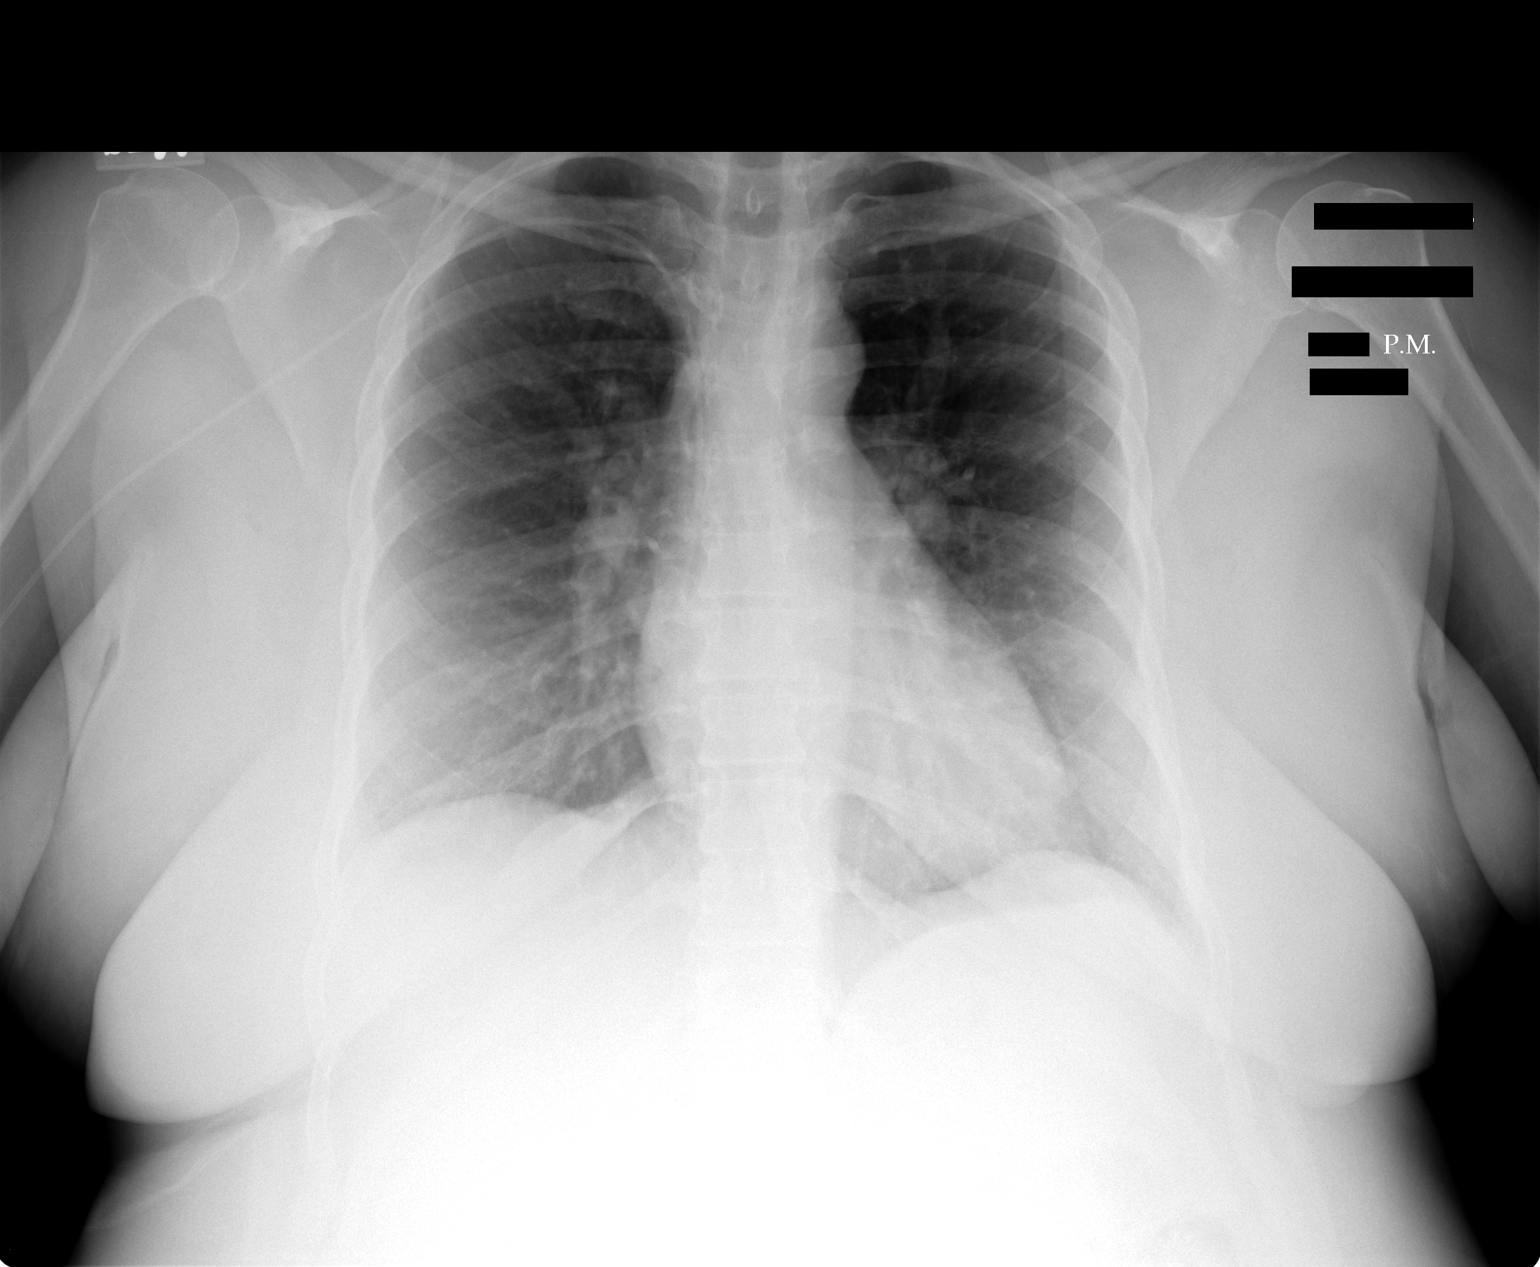

[1 of 1 positions shown; findings below may reference images not displayed]

IMPRESSION: Specifically the PICC line tip is in the distal SVC.  See comments above.

## 2006-05-16 HISTORY — PX: CHOLECYSTECTOMY: SHX55

## 2006-06-30 ENCOUNTER — Emergency Department (HOSPITAL_COMMUNITY): Admission: EM | Admit: 2006-06-30 | Discharge: 2006-07-01 | Payer: Self-pay | Admitting: Emergency Medicine

## 2006-07-02 ENCOUNTER — Inpatient Hospital Stay (HOSPITAL_COMMUNITY): Admission: EM | Admit: 2006-07-02 | Discharge: 2006-07-09 | Payer: Self-pay | Admitting: Emergency Medicine

## 2006-11-09 IMAGING — CR DG CHEST 2V
2 series · 2 of 2 positions shown · non-contrast
Comparison: 07/23/04.

CLINICAL DATA: Productive cough.  Back pain. 
 CHEST - 2 VIEWS:

[view not recorded (1 of 2)]
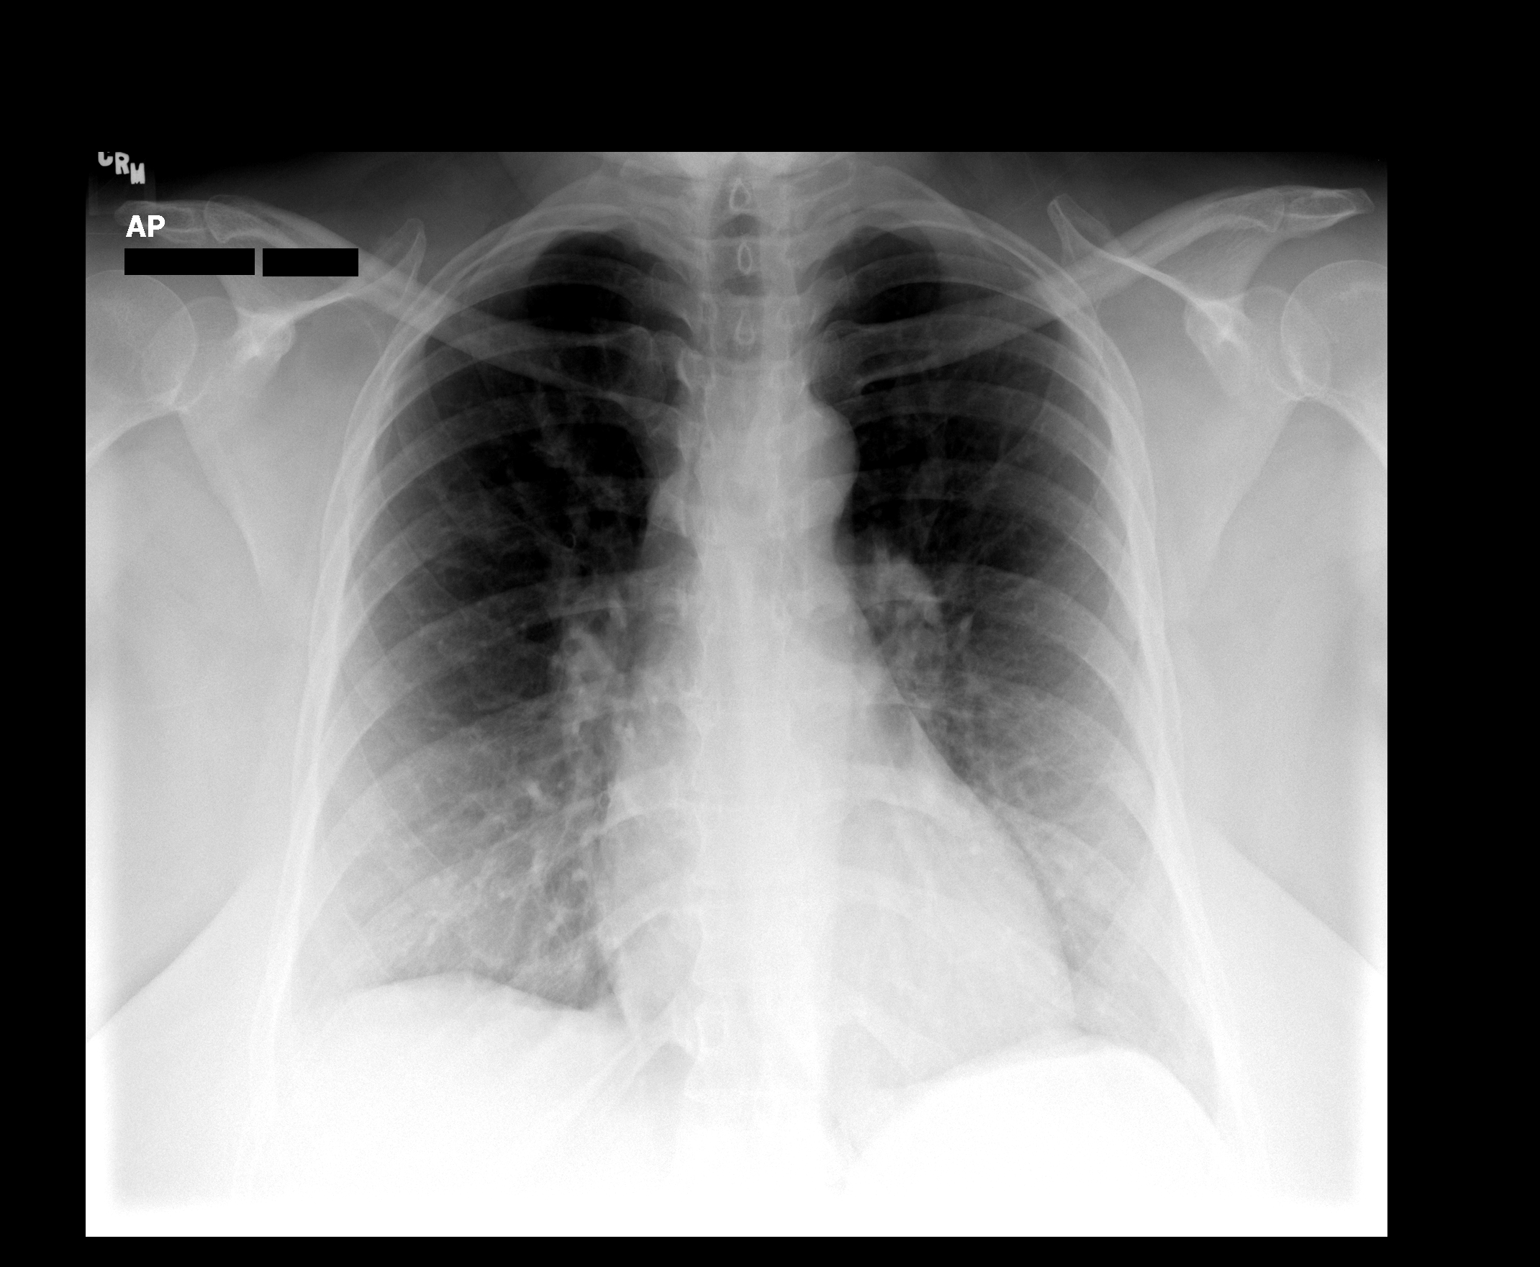

[view not recorded (2 of 2)]
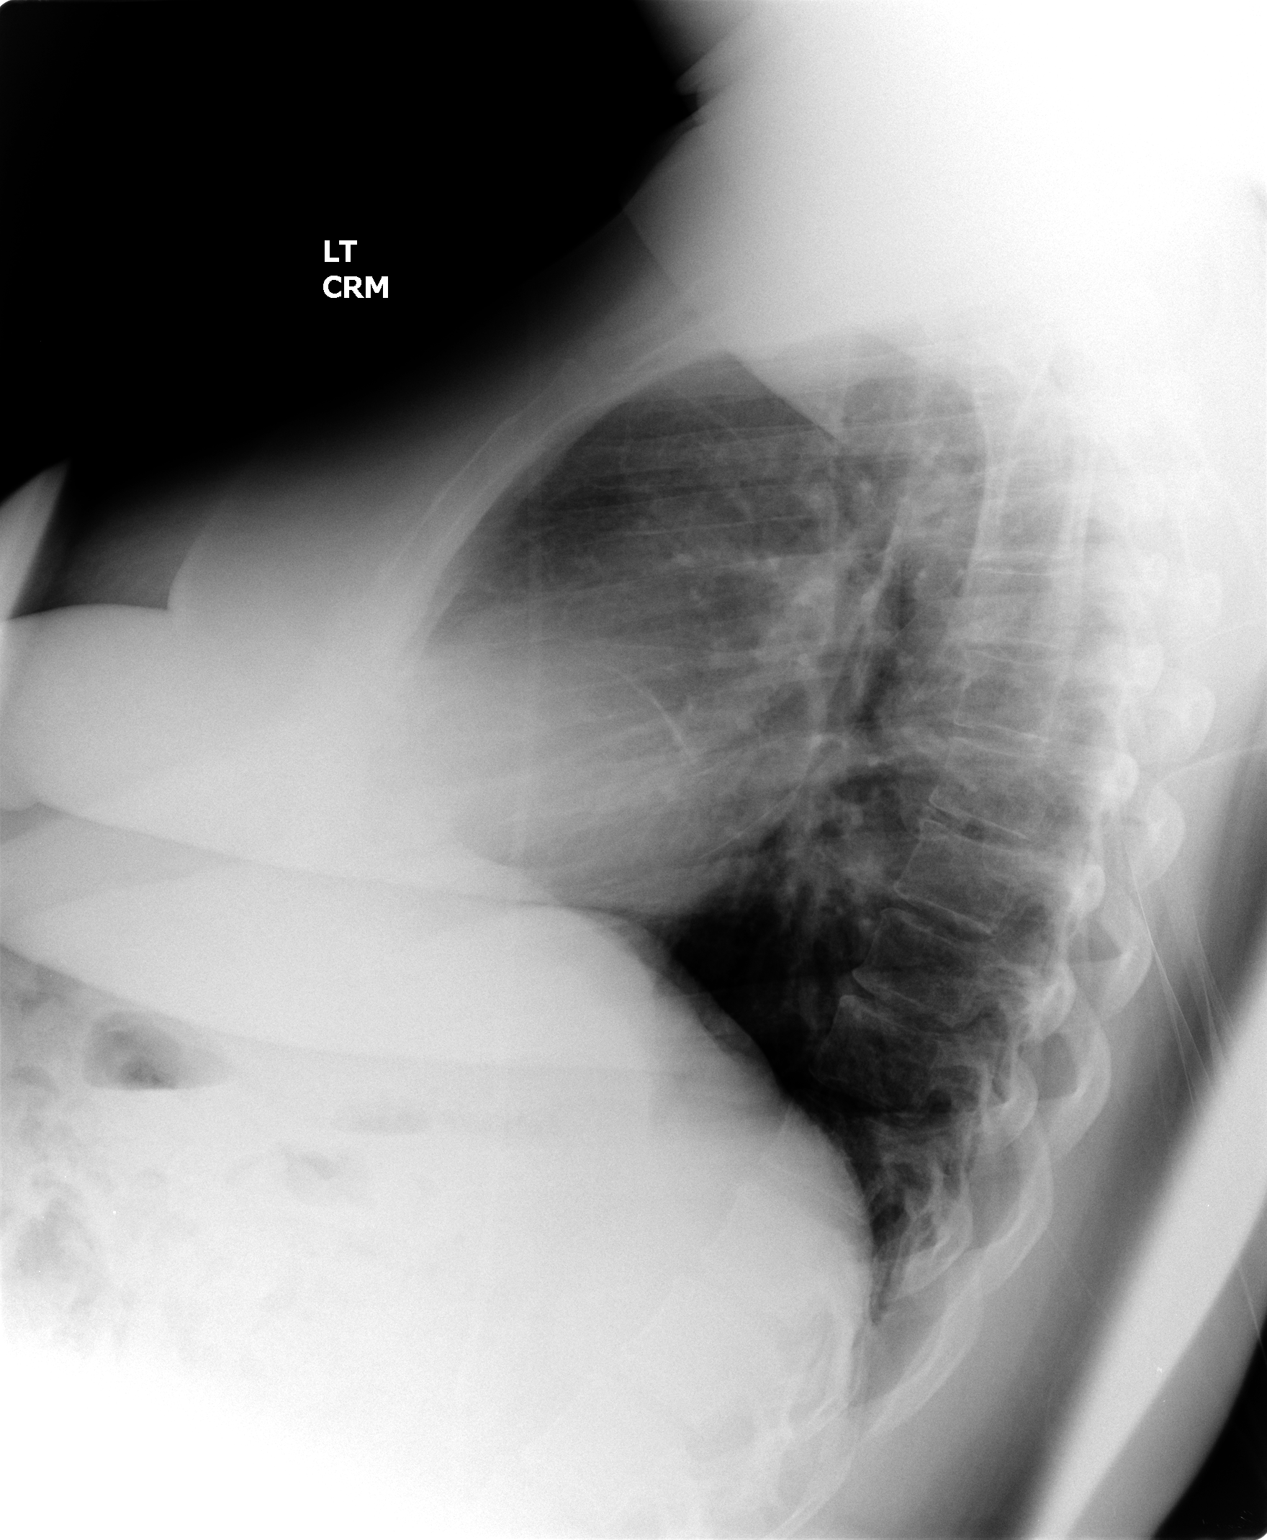

[2 of 2 positions shown; findings below may reference images not displayed]

FINDINGS: The heart size and mediastinal contours are within normal limits.  Both lungs are clear.  The visualized skeletal structures are unremarkable.
IMPRESSION: No active cardiopulmonary disease.

## 2006-11-10 IMAGING — US US RENAL
1 series · 14 of 25 positions shown · non-contrast
Comparison: None.

CLINICAL DATA: Abdominal pain and urinary tract infection.   Question hydronephrosis.  
 RENAL ULTRASOUND:

[Series 1: unknown · 0.33mm/px · 14 of 26 slices shown]
[im 1/26]
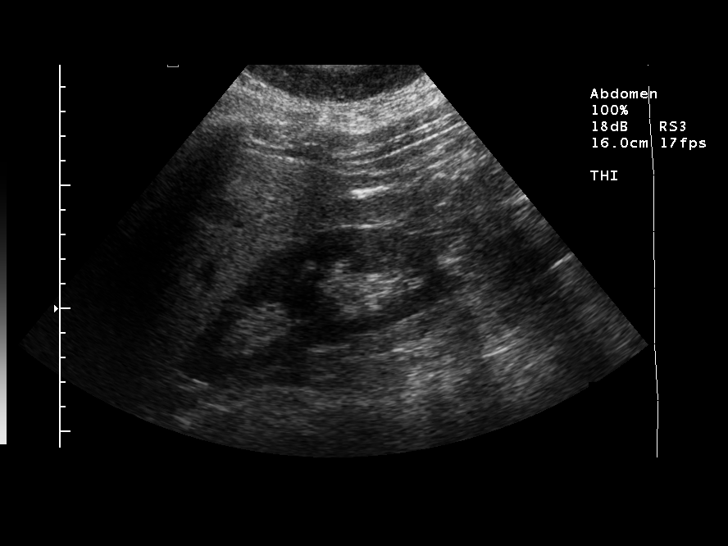
[im 3/26]
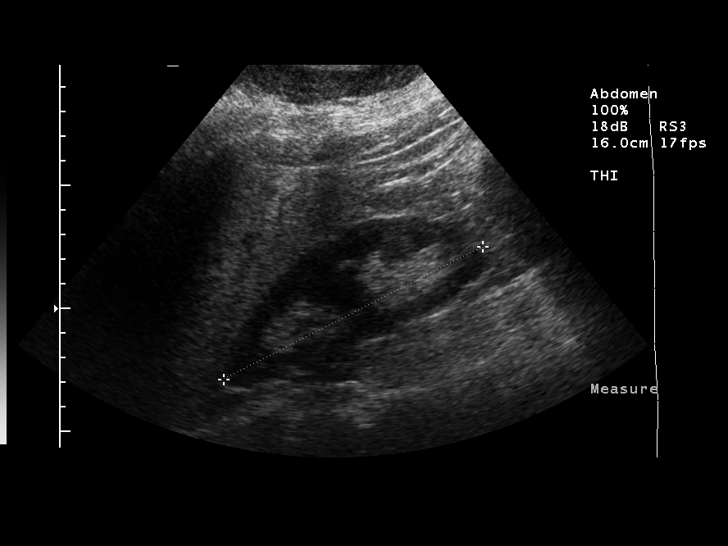
[im 5/26]
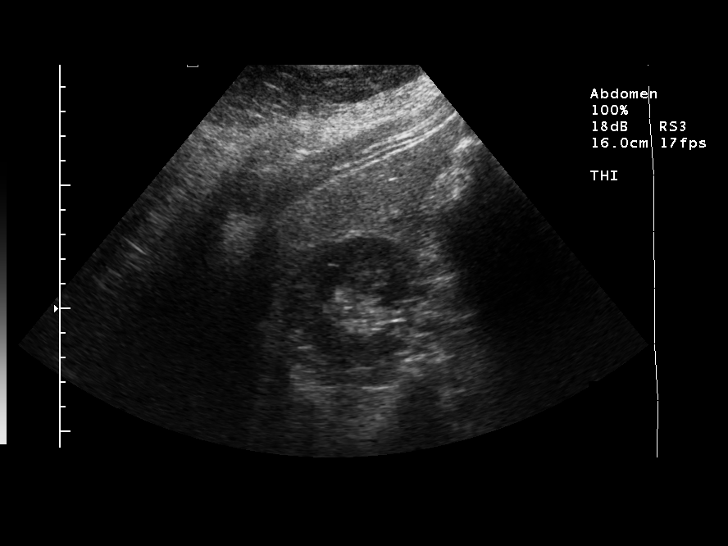
[im 7/26]
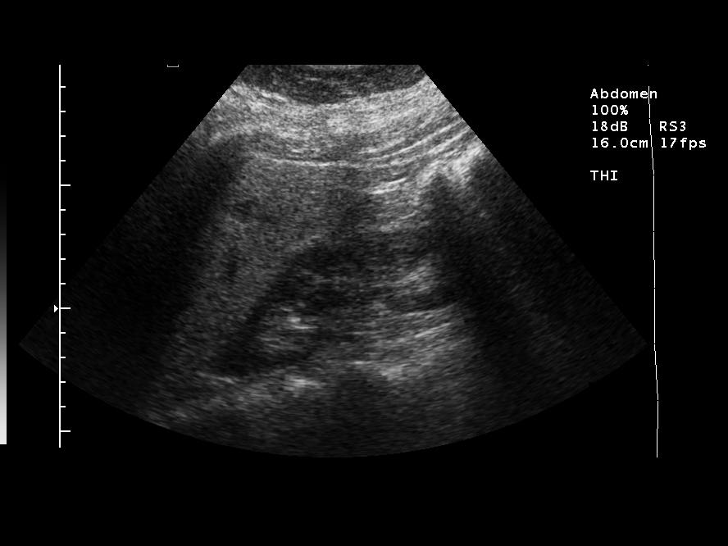
[im 9/26]
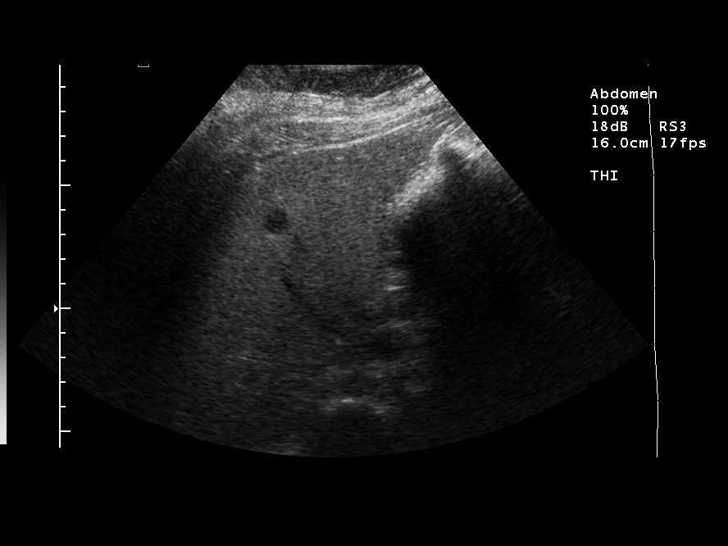
[im 10/26]
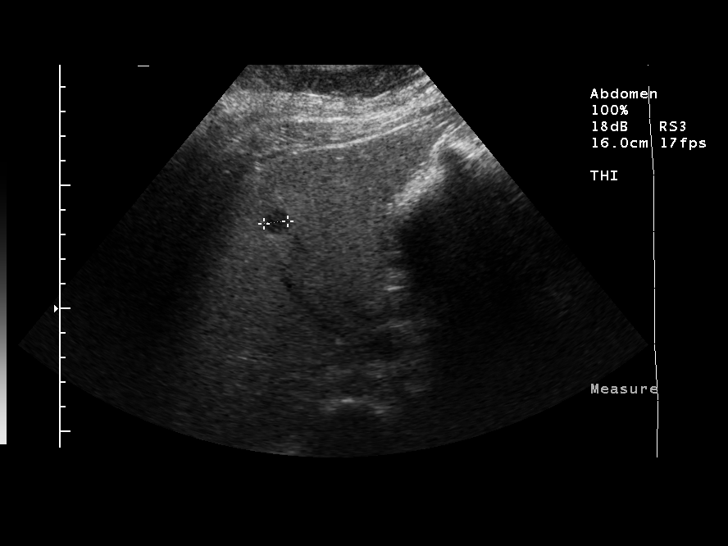
[im 12/26]
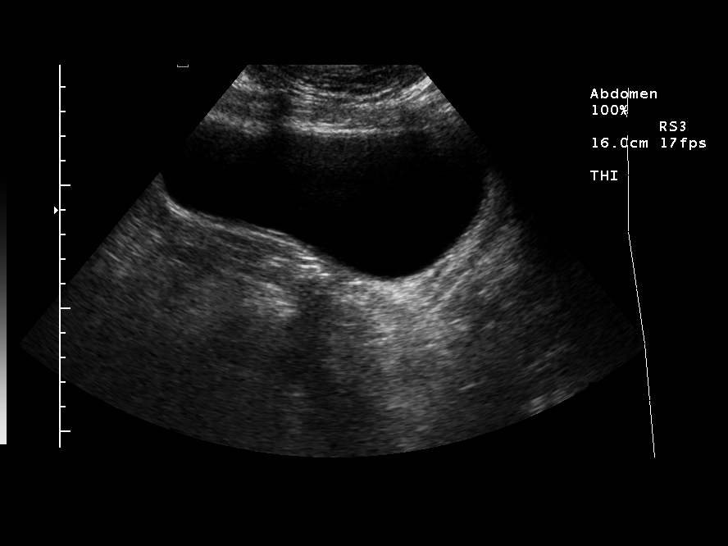
[im 14/26]
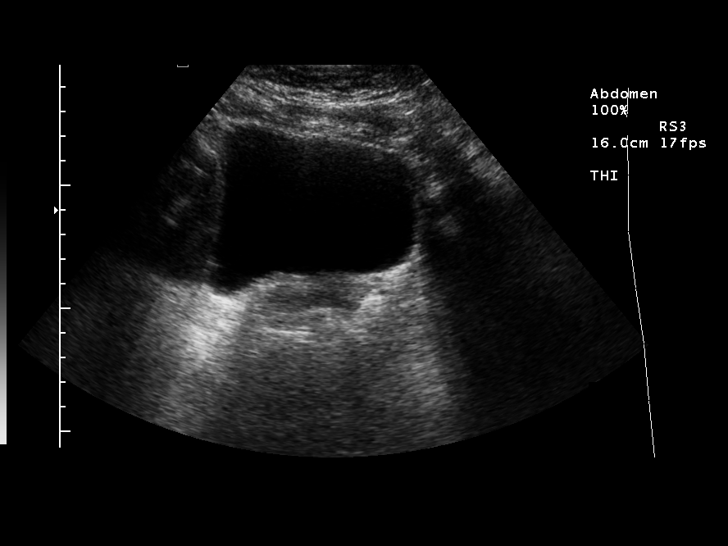
[im 16/26]
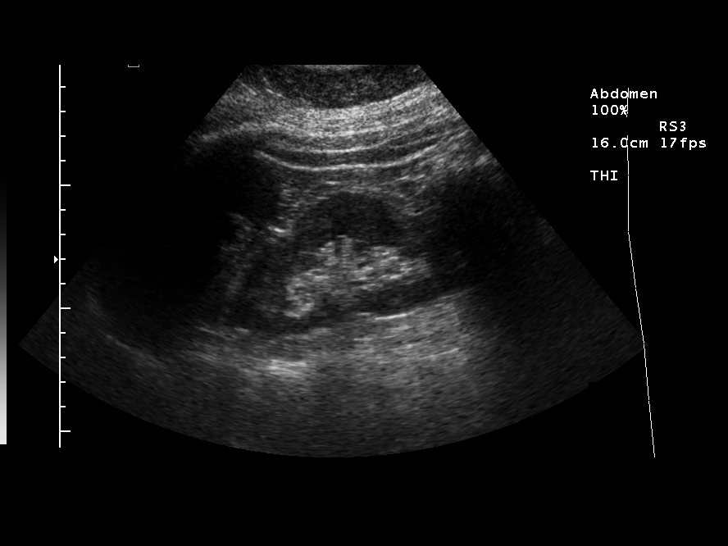
[im 17/26]
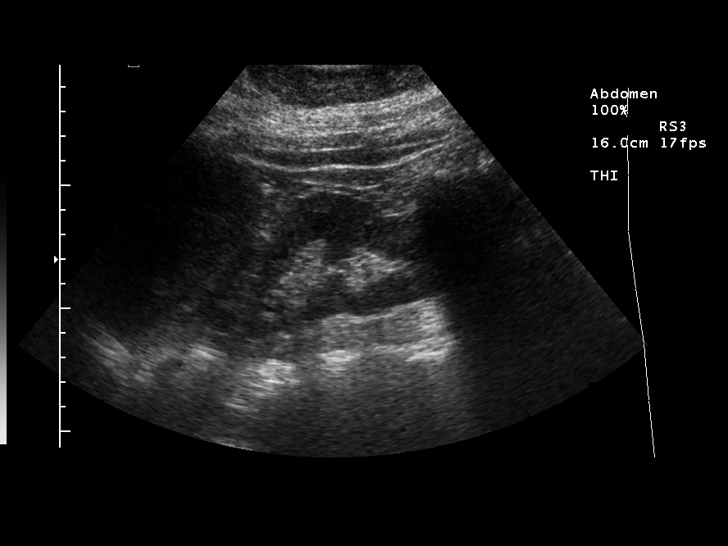
[im 19/26]
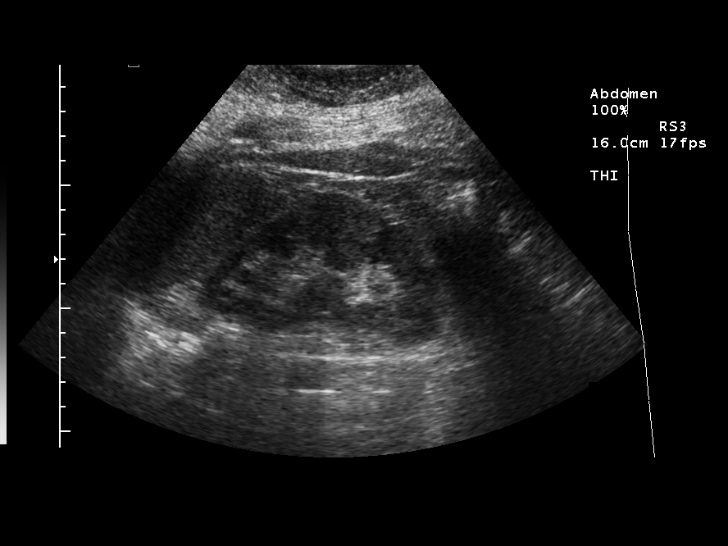
[im 21/26]
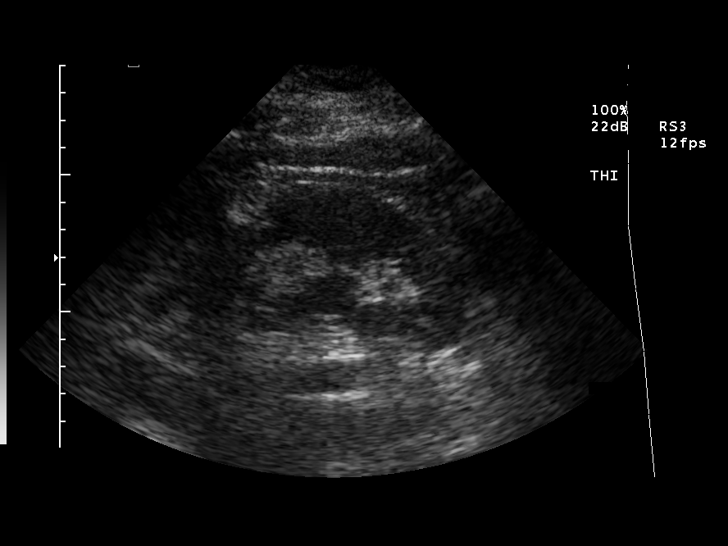
[im 23/26]
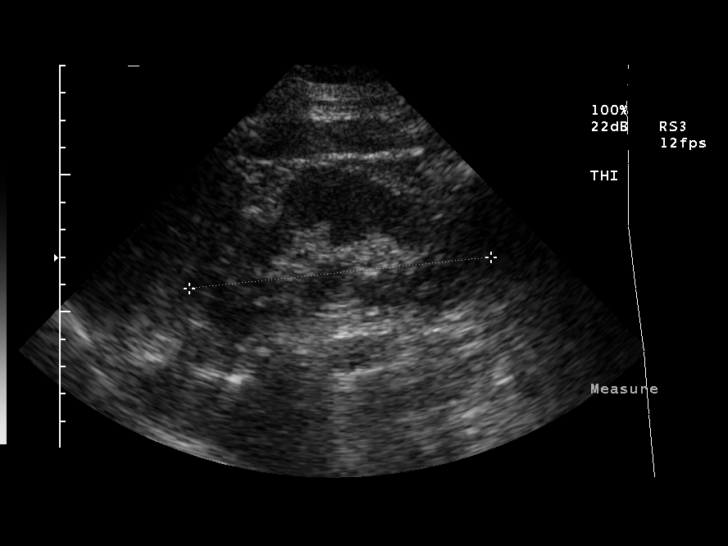
[im 26/26]
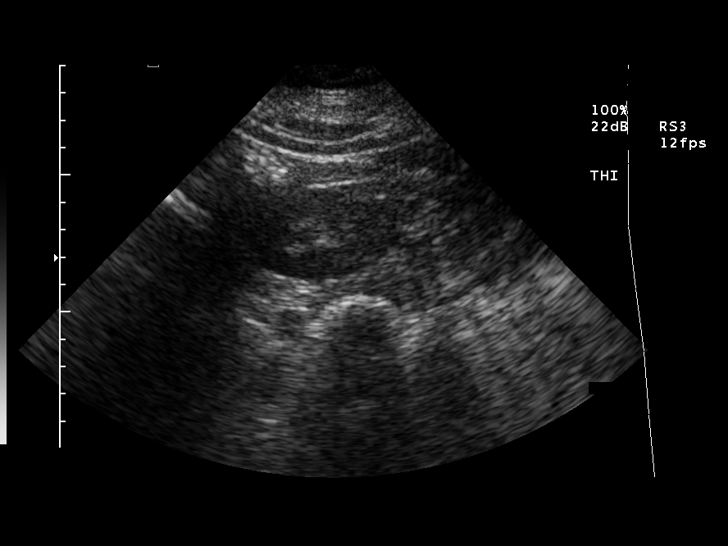

[14 of 25 positions shown; findings below may reference images not displayed]

FINDINGS: Both kidneys are normal in size without hydronephrosis.  The right kidney measures 11.8 cm in length and the left kidney 11.1 cm.  No focal renal abnormalities are demonstrated.  On evaluation of the right kidney, the sonographer noted a 1.4 X 1.0 X 1.0 cm hypoechoic lesion in the right lobe of the liver.  The remainder of the liver is echogenic, probably due to fatty infiltration.  Views of the urinary bladder are unremarkable.
IMPRESSION: 1.  Normal renal ultrasound.  No evidence of hydronephrosis.  
 2.  Indeterminate small liver lesion with probable underlying diffuse fatty infiltration of the liver.  If clinically warranted, this could be further characterized with CT of the abdomen with contrast.

## 2006-12-05 IMAGING — US US EXREM LOW ARTERIAL SEG MULTIPLE BILAT
1 series · 1 of 1 positions shown · non-contrast
Comparison: none

CLINICAL DATA: Claudication
 LOWER EXTREMITY ARTERIAL DOPPLER STUDY WITH SEGMENTAL PRESSURE MEASUREMENTS:

[Series 1: unknown · 0.32mm/px · 1 of 1 slices shown]
[im 1/1]
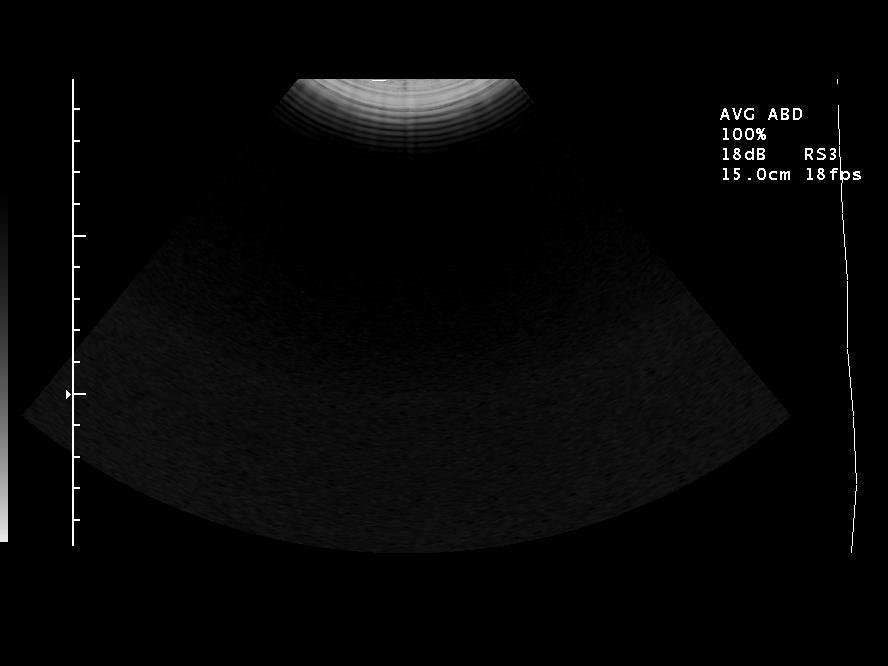

[1 of 1 positions shown; findings below may reference images not displayed]

FINDINGS: Ankle brachial indices are both 1 or better.  There is an apparent pressure gradient between the right thigh and right calf, however, there are triphasic waveforms for the right femoral, popliteal and posterior tibial artery regions.  Therefore, I do not think that there is a true arterial lesion between the right thigh and the calf.  Similar findings are noted for the left lower extremity with an apparent pressure gradient between the left thigh and calf with triphasic waveforms throughout.  The left dorsalis pedis waveform is biphasic and the right dorsalis pedis is monophasic.  At the ankle, however, the pressure measurements for the posterior tibial  and dorsalis pedis regions are similar and the posterior tibial waveforms are triphasic.
IMPRESSION: Some abnormalities are present, however, there is felt to be no significant arterial stenosis throughout the lower extremities to the calves.  I suspect significant atherosclerotic disease of the dorsalis pedis arteries bilaterally, however.

## 2006-12-31 ENCOUNTER — Emergency Department (HOSPITAL_COMMUNITY): Admission: EM | Admit: 2006-12-31 | Discharge: 2007-01-01 | Payer: Self-pay | Admitting: Emergency Medicine

## 2007-02-04 IMAGING — CR DG CHEST 1V PORT
1 series · 1 of 1 positions shown · non-contrast
Comparison: 10/06/04.

CLINICAL DATA: Weakness and hyperglycemia.
 PORTABLE CHEST - 1 VIEW, 01/01/05, 0565 HOURS:

[view not recorded]
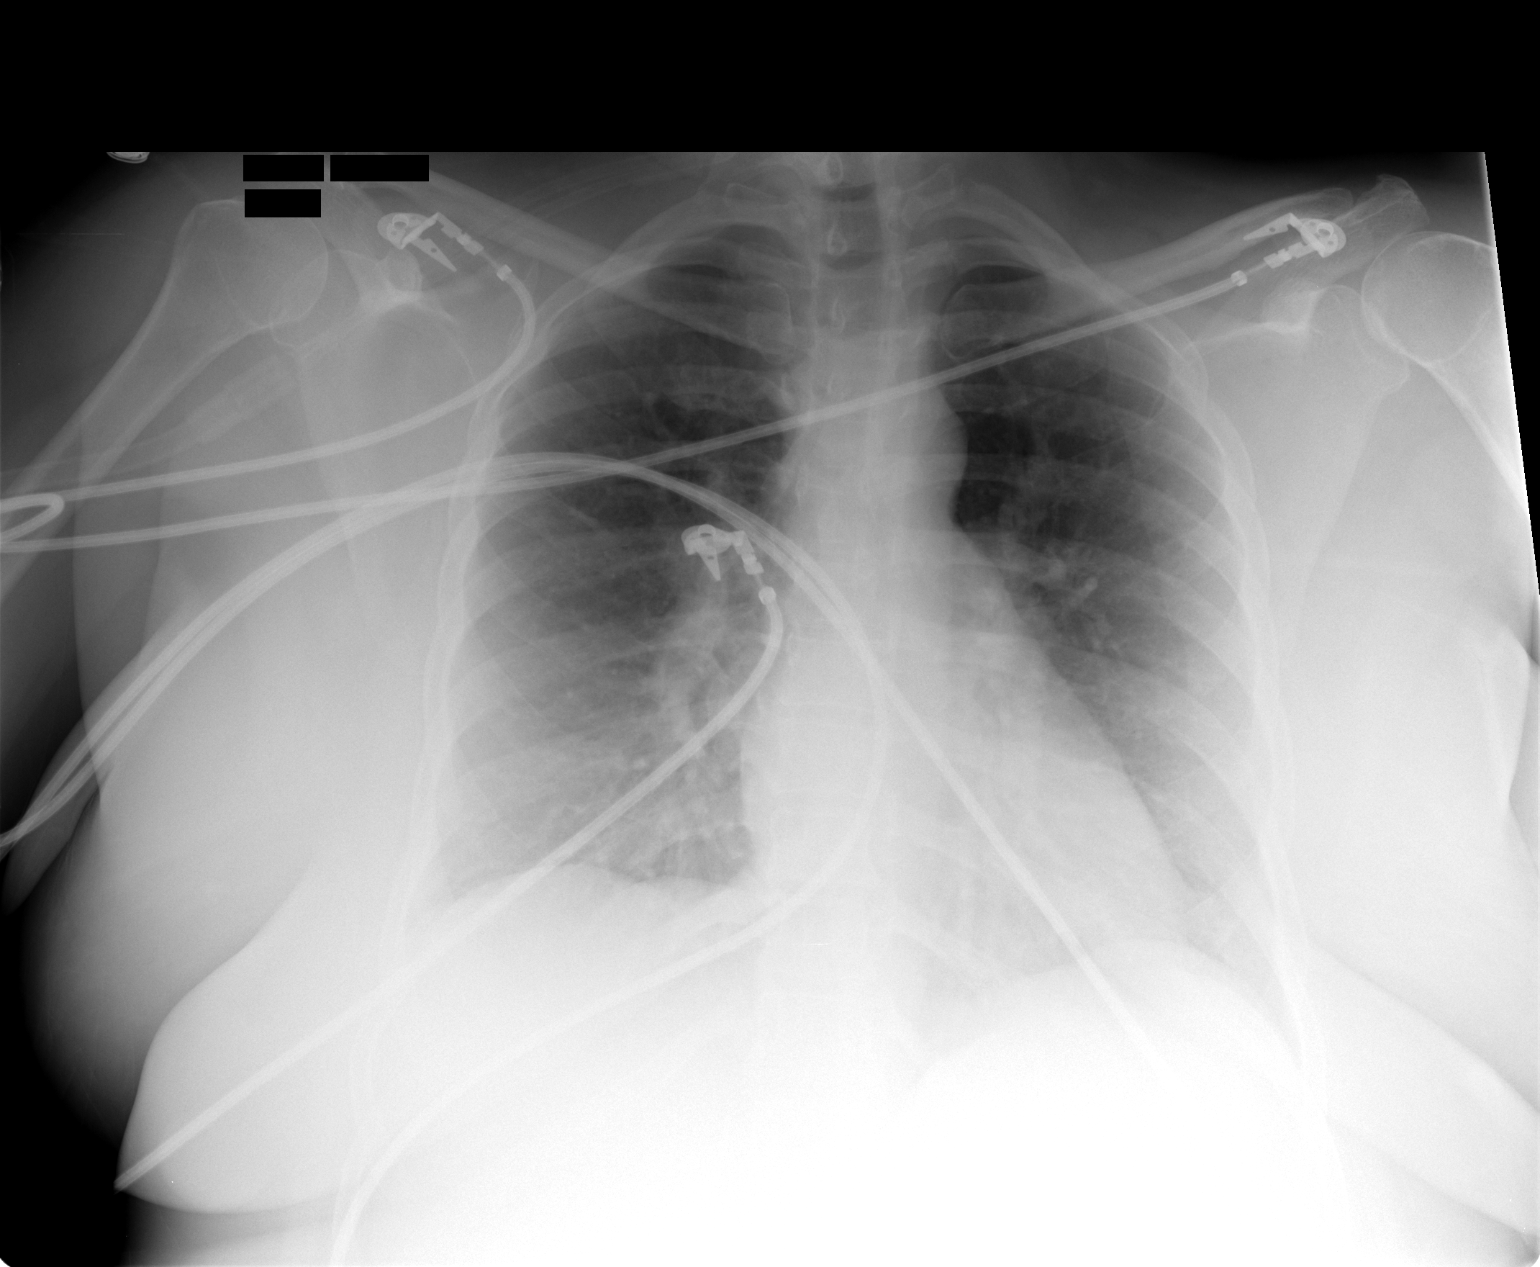

[1 of 1 positions shown; findings below may reference images not displayed]

The cardiomediastinal contours are stable.  The lungs are clear.  There is no pleural effusion or pneumothorax.  No osseous abnormalities are seen.
IMPRESSION: Stable exam.  No active cardiopulmonary process demonstrated.

## 2007-02-04 IMAGING — CR DG ABDOMEN ACUTE W/ 1V CHEST
4 series · 4 of 4 positions shown · non-contrast
Comparison: none

CLINICAL DATA: Worsening abdominal pain.  Hyperglycemia. 
 ABDOMEN ? 2 VIEWS AND CHEST ? 1 VIEW:

[view not recorded (1 of 4)]
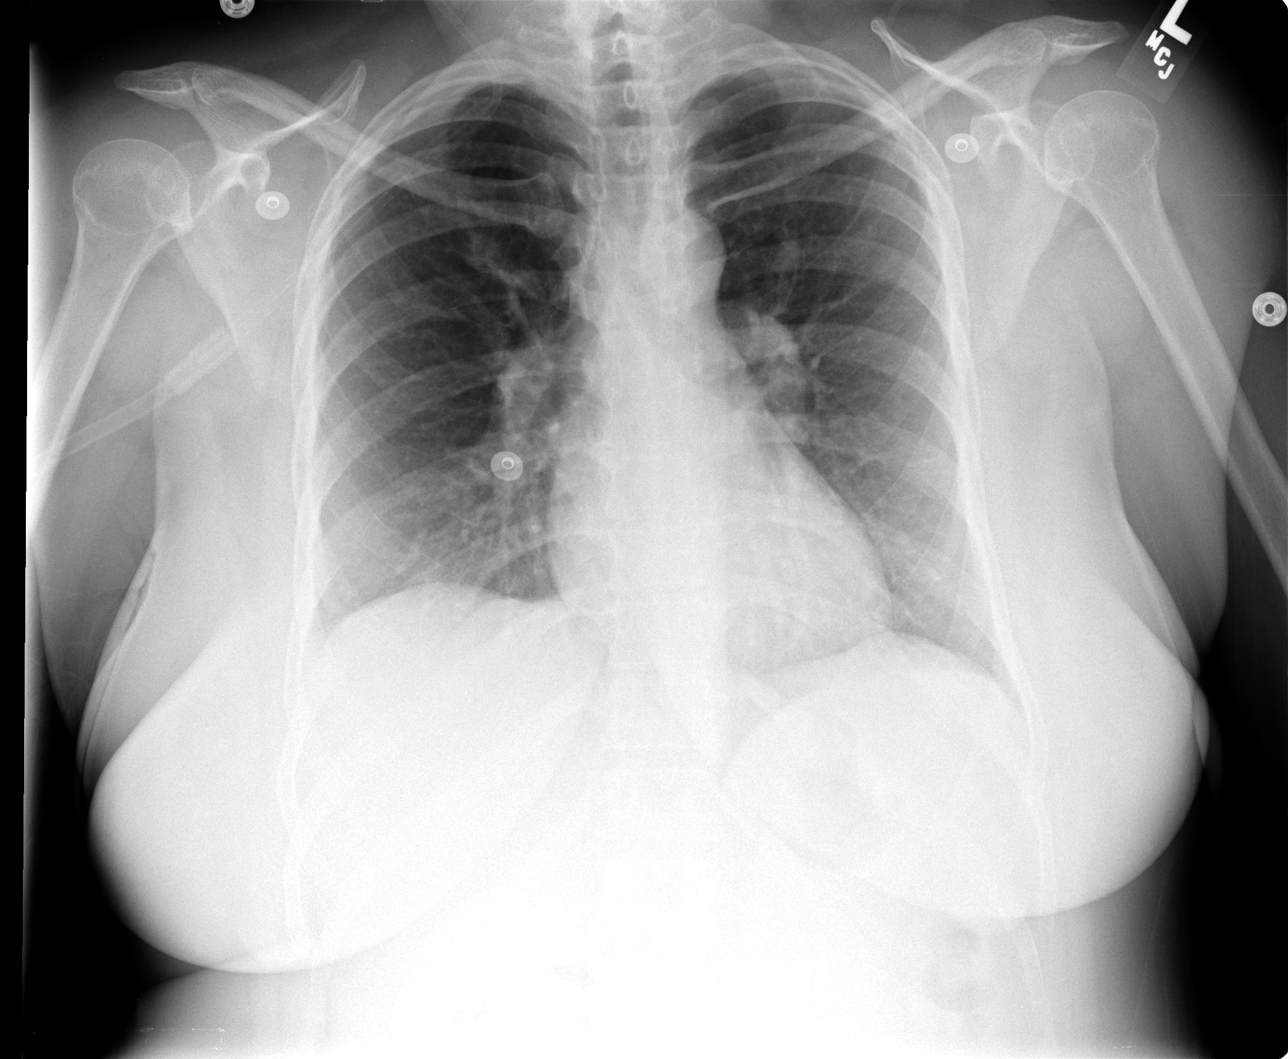

[view not recorded (2 of 4)]
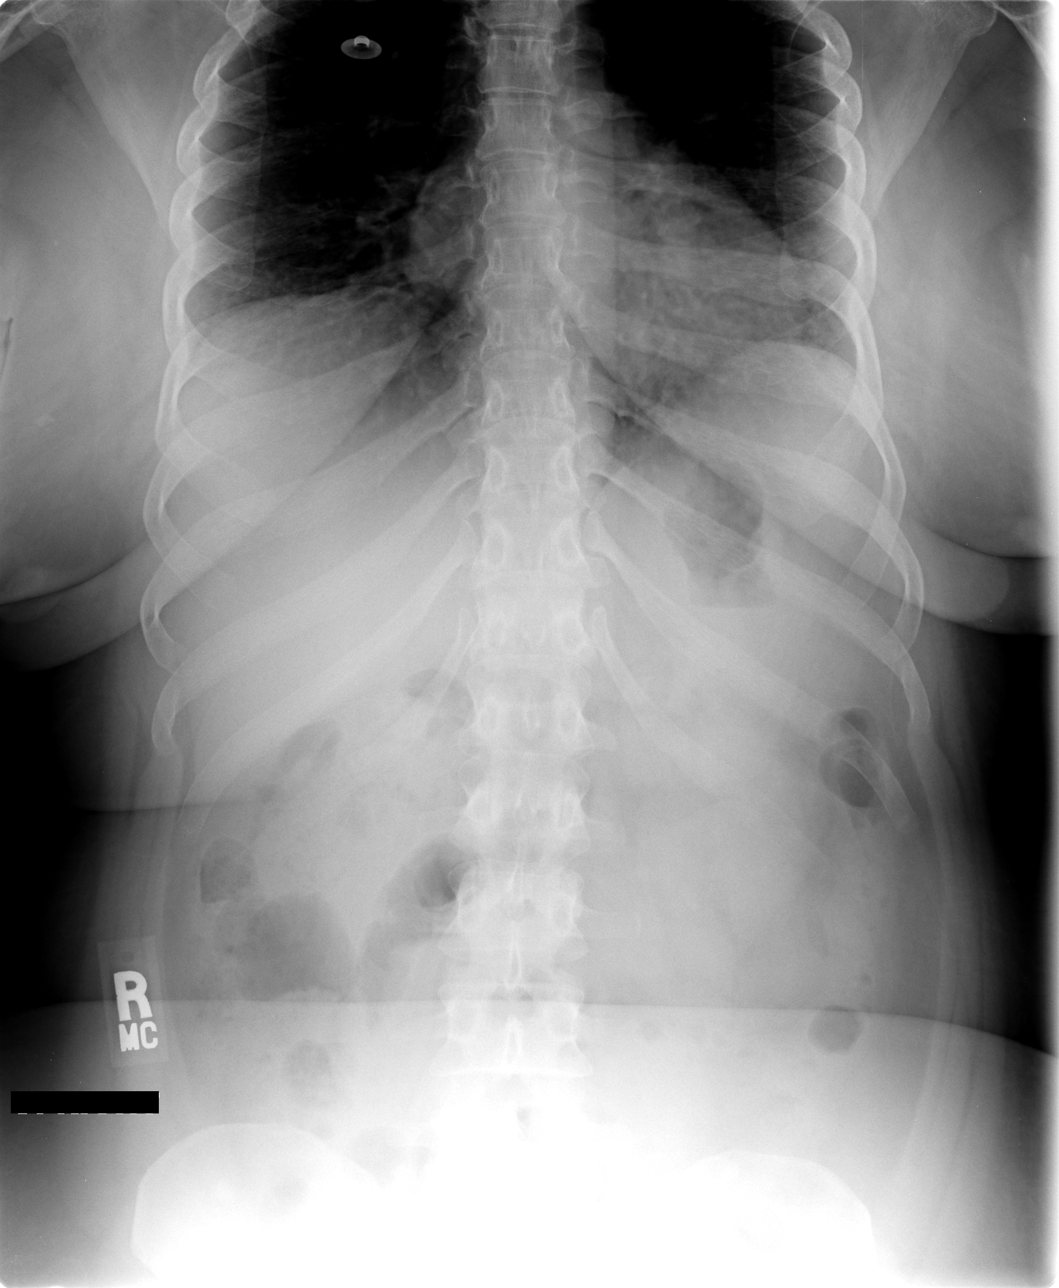

[view not recorded (3 of 4)]
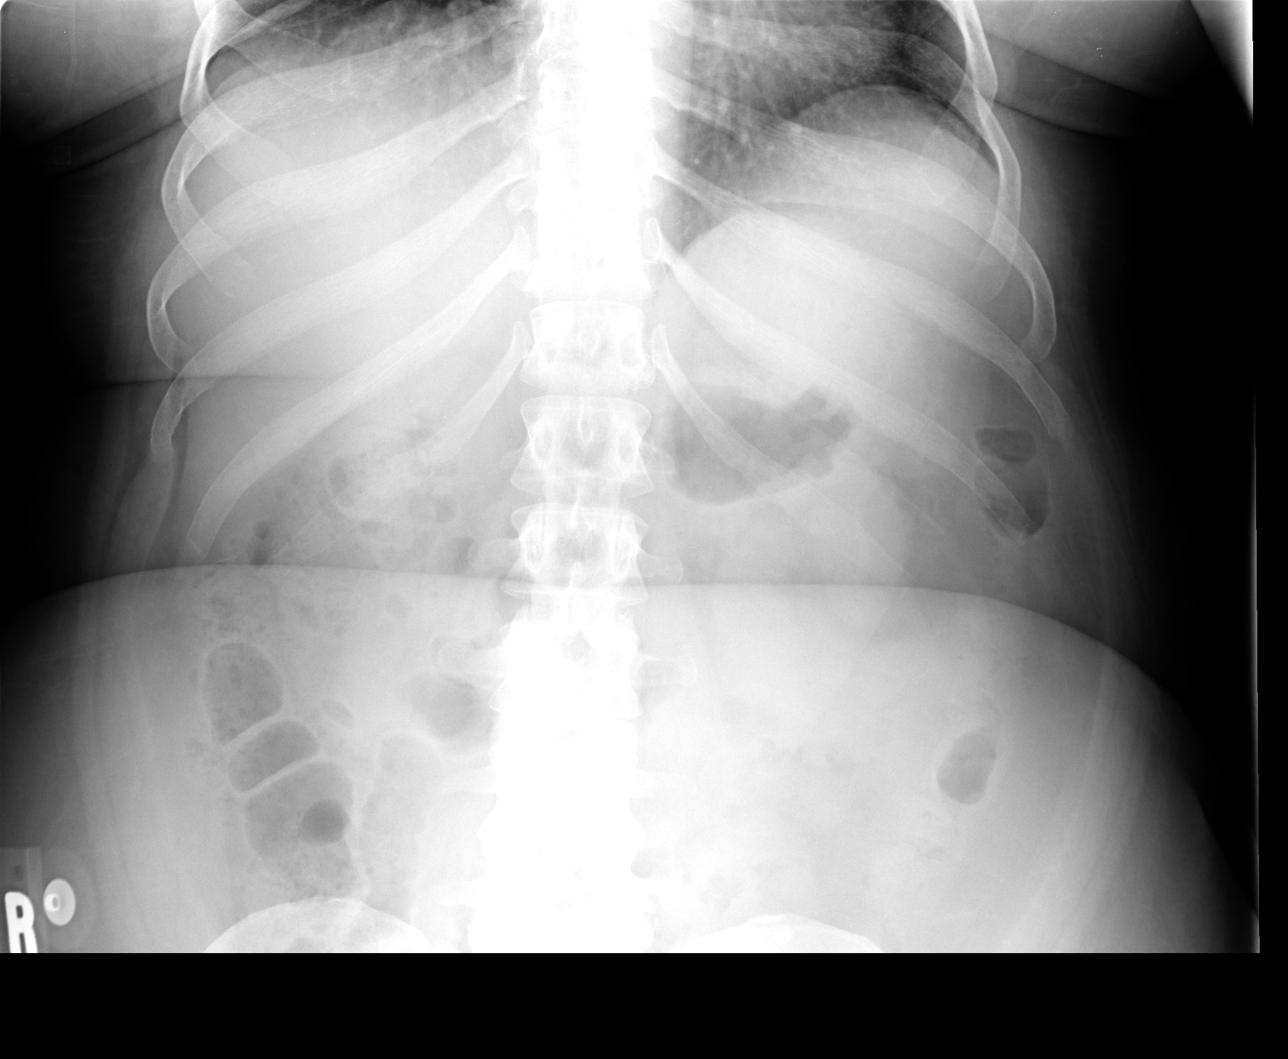

[view not recorded (4 of 4)]
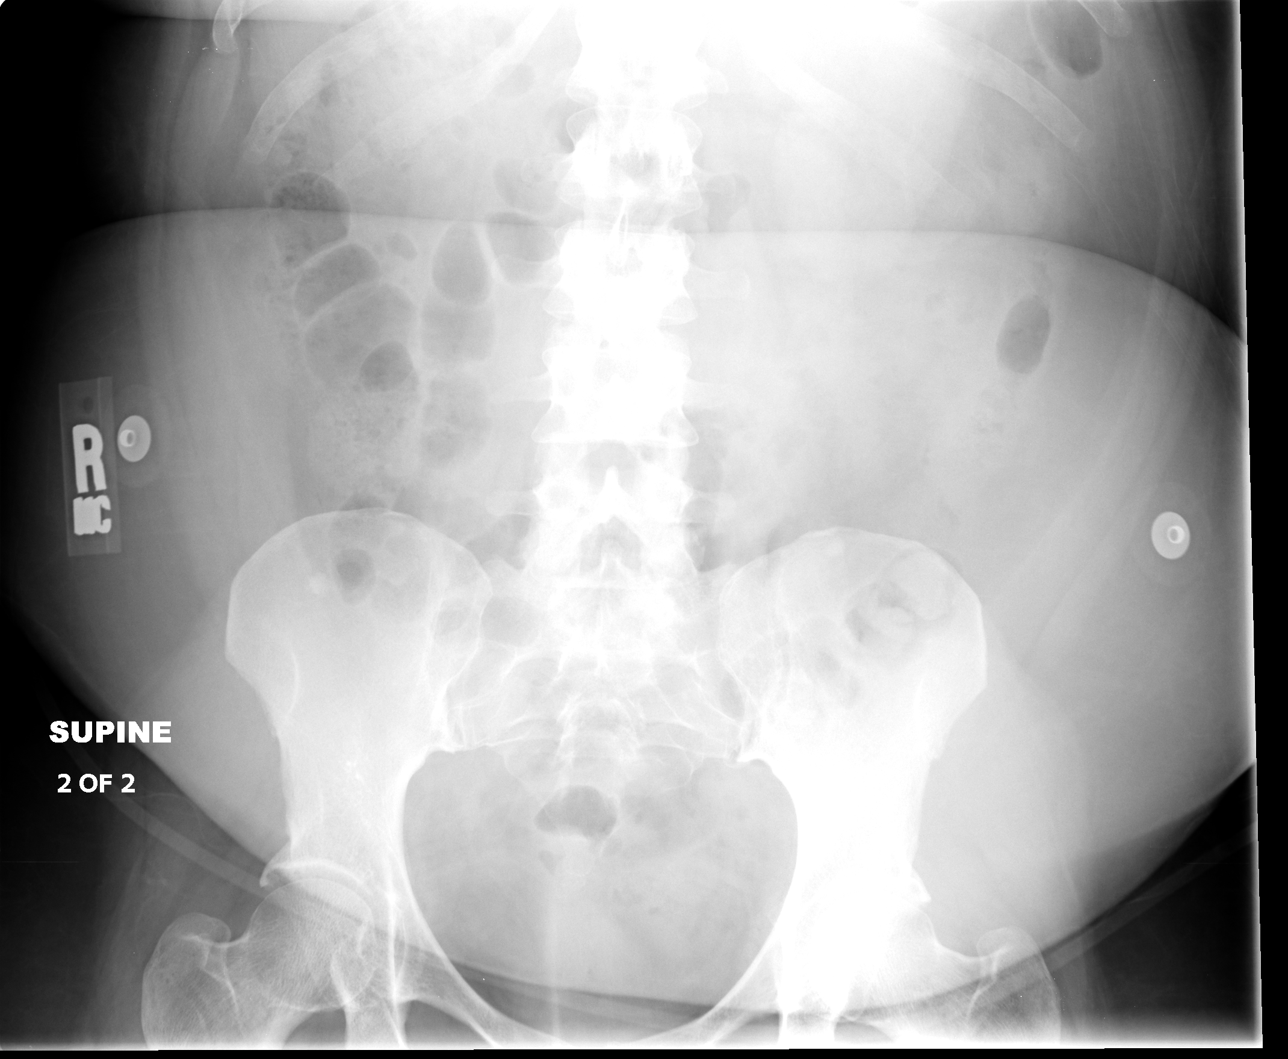

[4 of 4 positions shown; findings below may reference images not displayed]

FINDINGS: The bowel gas pattern is normal.  There is no evidence of air fluid levels or free intraperitoneal air.  No radiopaque calculi are identified.  
 Heart size is normal.  There is no evidence of pulmonary infiltrate or pleural effusion.   An asymmetric nodular opacity is seen in the left suprahilar region, which measures approximately 1 cm and is suspicious for a pulmonary nodule.
IMPRESSION: 1.  Normal bowel gas pattern. 
 2.  No acute lung disease.  
 3.  Question left upper lobe pulmonary nodule.  Noncontrast chest CT is recommended for further evaluation.

## 2007-02-05 IMAGING — CT CT CHEST W/O CM
1 of 2 series · 14 of 31 positions shown, 18 images · IV contrast (agent unspecified)
Comparison: none

CLINICAL DATA: Chest pain radiating to shoulder.  Shortness of breath.  
 CHEST CT WITHOUT CONTRAST:
TECHNIQUE: Multidetector CT imaging of the chest was performed following the standard protocol without IV contrast.  Intravenous contrast was not administered per order of Dr. Jumper.  
 There is no evidence of hilar or mediastinal masses on this noncontrast study.  There is no evidence of thoracic aortic aneurysm.  There is no evidence of pleural or pericardial effusion.  
 Both lungs are clear and without evidence of mass or infiltrate.  Noncontrast images obtained through the upper abdominal structures are unremarkable.

[Series 3: recon 2: chest w/0 · axial · 0.80mm/px · z∈[-267,-42]mm · 14 of 55 slices shown, 18 images]
[im 5/55  mediastinal]
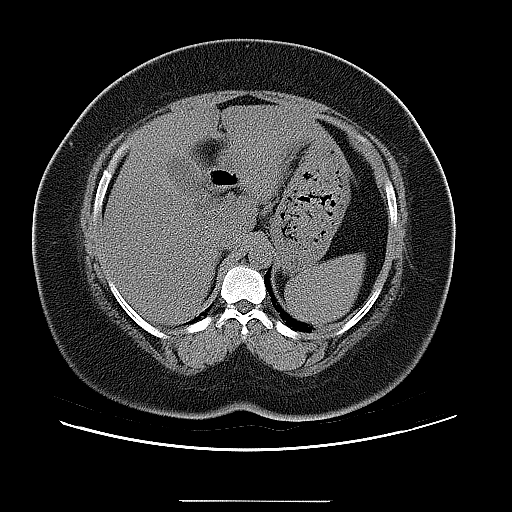
[im 5/55  lung]
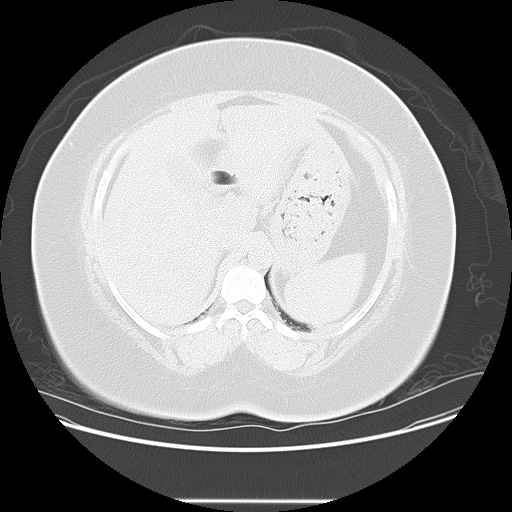
[im 9/55  lung]
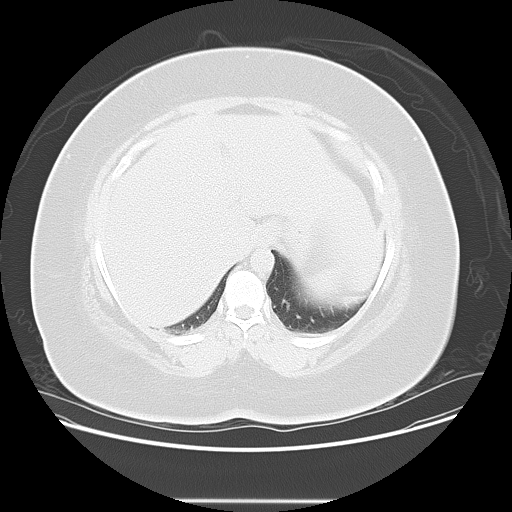
[im 13/55  lung]
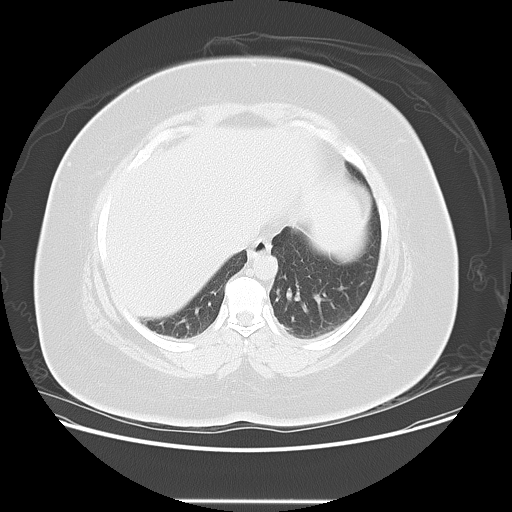
[im 17/55  lung]
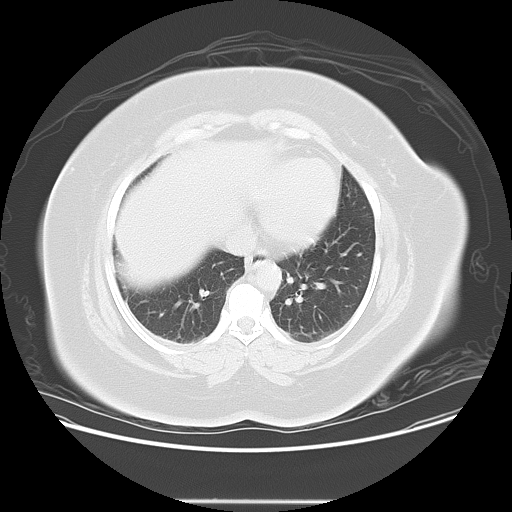
[im 21/55  mediastinal]
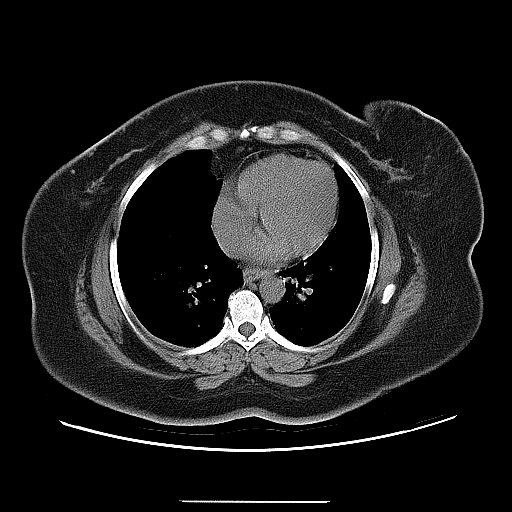
[im 21/55  lung]
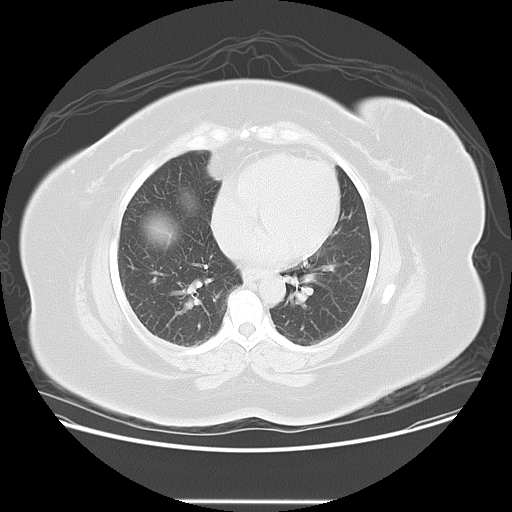
[im 25/55  lung]
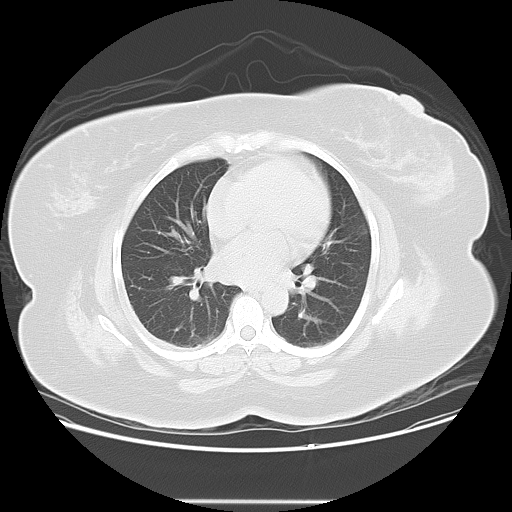
[im 26/55  lung]
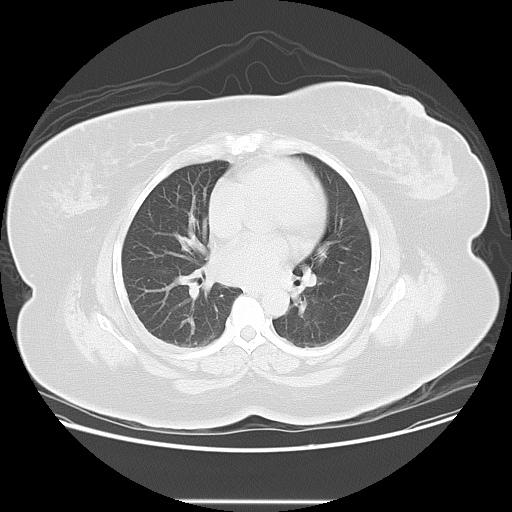
[im 28/55  lung]
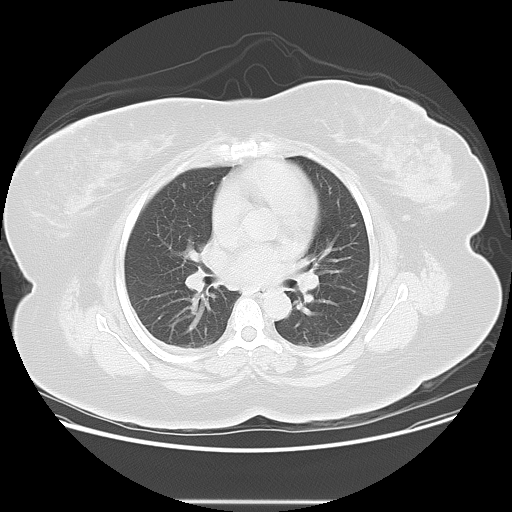
[im 30/55  mediastinal]
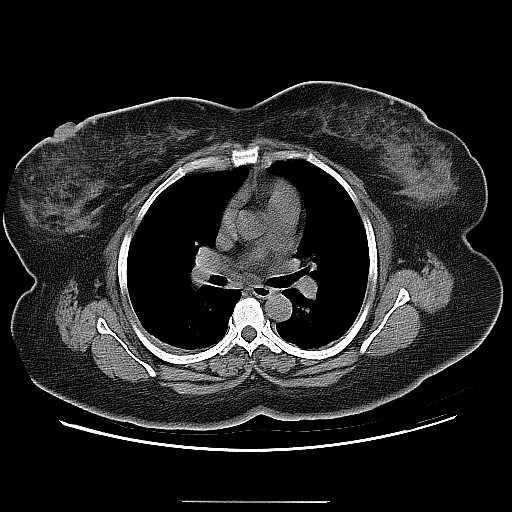
[im 30/55  lung]
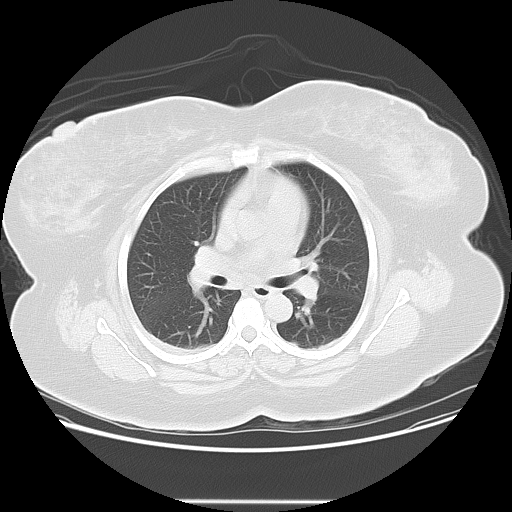
[im 34/55  lung]
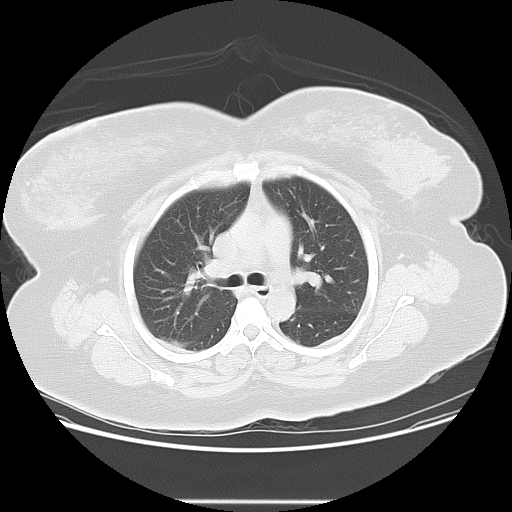
[im 38/55  lung]
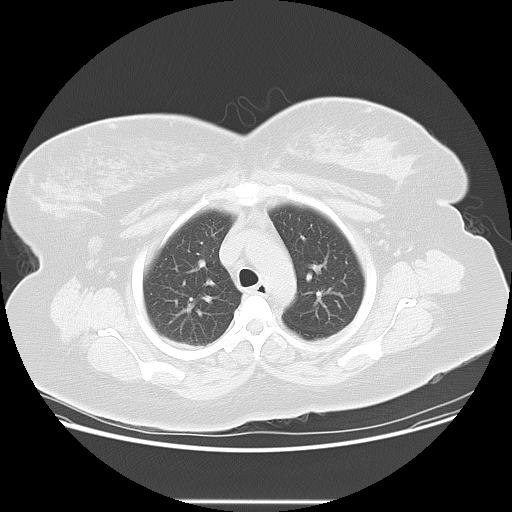
[im 42/55  lung]
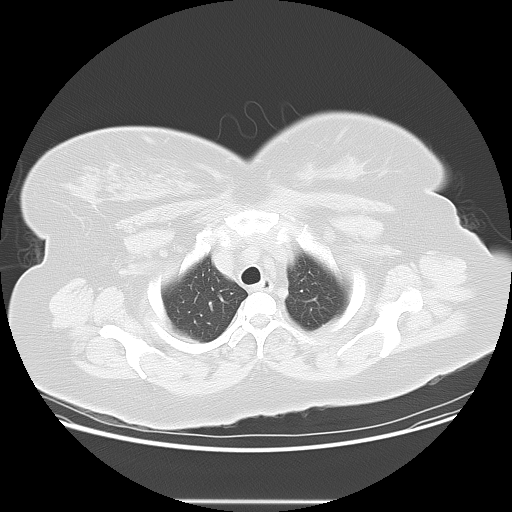
[im 46/55  mediastinal]
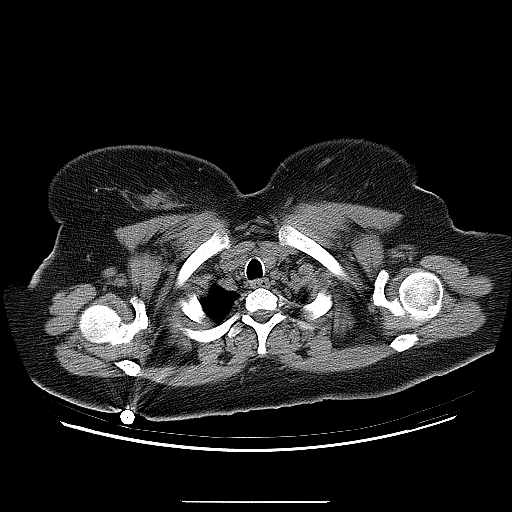
[im 46/55  lung]
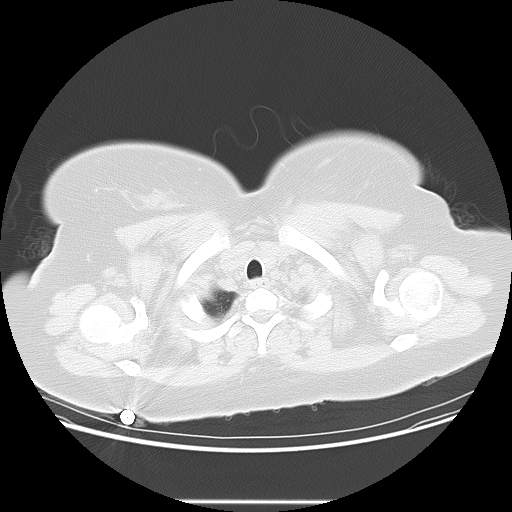
[im 50/55  lung]
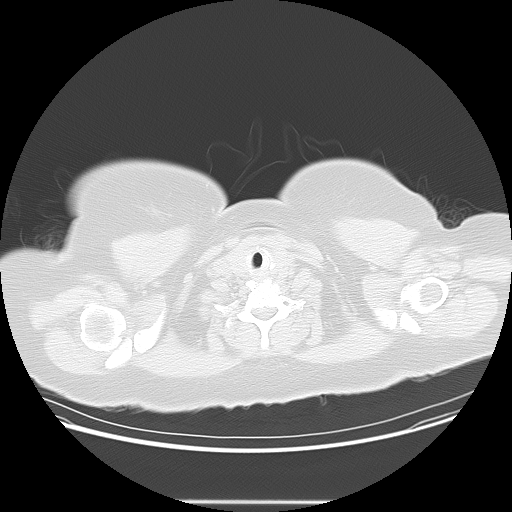

[14 of 31 positions shown; findings below may reference images not displayed]

IMPRESSION: Unremarkable noncontrast chest CT.  No active disease.

## 2007-03-01 IMAGING — RF DG UGI W/ KUB
17 series · 17 of 17 positions shown · non-contrast
Comparison: none

CLINICAL DATA: Dysphagia.
 KUB:
 A supine film of the abdomen shows a nonspecific bowel gas pattern.  No opaque calculi are noted.
 UPPER GI SERIES:
 A single contrast upper GI was performed.   The neuromuscular swallowing mechanism appears normal.  There are mild tertiary contractions distally.  No hiatal hernia or reflux is seen.  The stomach is normal in contour and peristalsis.  The duodenal bulb fills well with no ulceration and the duodenal loop is in normal position.

[Series 1: run · 1 of 1 slices shown (1 of 16)]
[im 1/1]
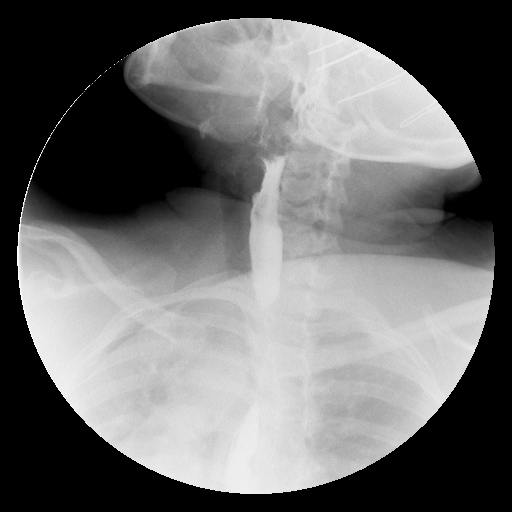

[Series 2: run · 1 of 1 slices shown (2 of 16)]
[im 1/1]
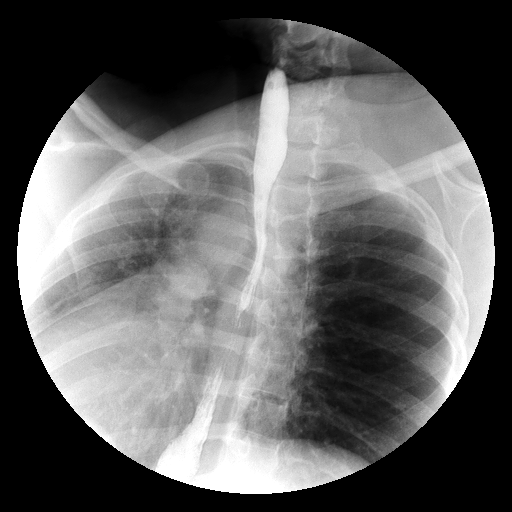

[Series 3: run · 1 of 1 slices shown (3 of 16)]
[im 1/1]
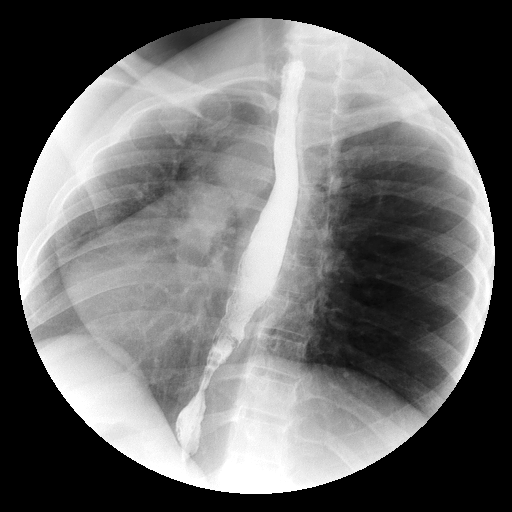

[Series 4: run · 1 of 1 slices shown (4 of 16)]
[im 1/1]
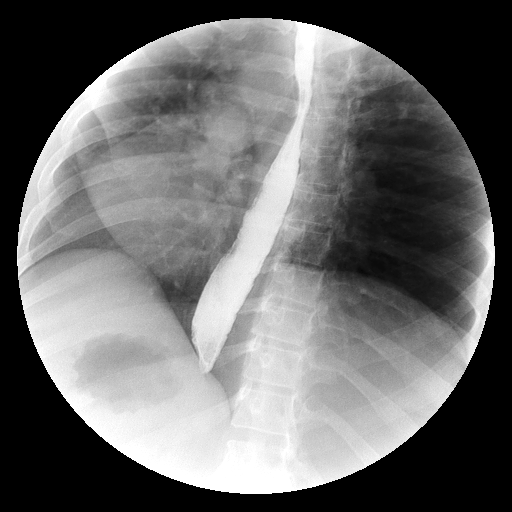

[Series 5: run · 1 of 1 slices shown (5 of 16)]
[im 1/1]
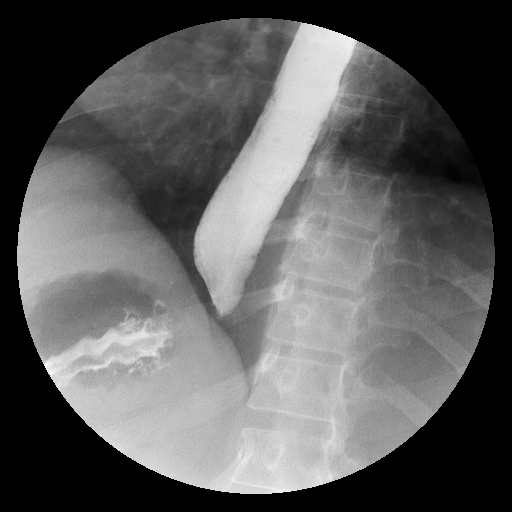

[Series 6: run · 1 of 1 slices shown (6 of 16)]
[im 1/1]
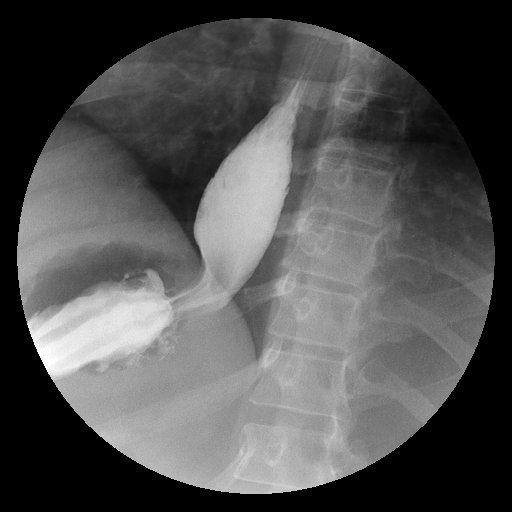

[Series 7: run · 1 of 1 slices shown (7 of 16)]
[im 1/1]
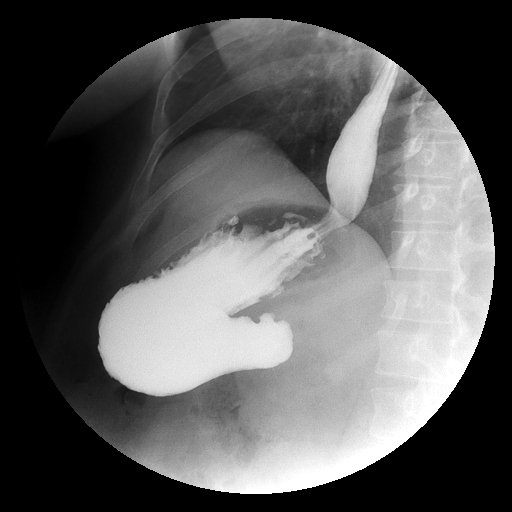

[Series 8: run · 1 of 1 slices shown (8 of 16)]
[im 1/1]
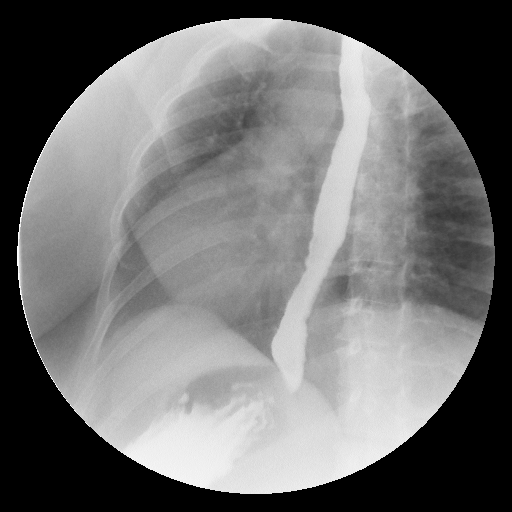

[Series 9: run · 1 of 1 slices shown (9 of 16)]
[im 1/1]
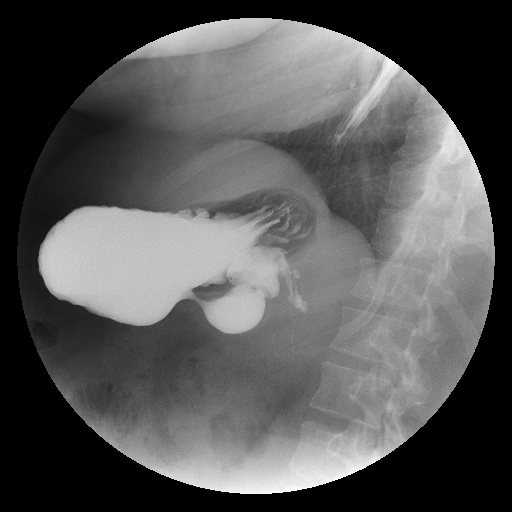

[Series 10: run · 1 of 1 slices shown (10 of 16)]
[im 1/1]
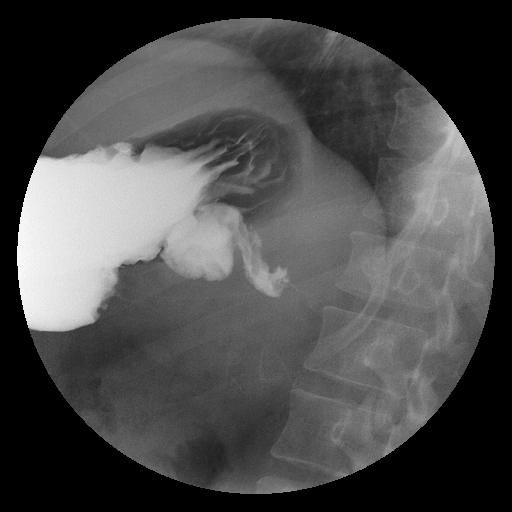

[Series 11: run · 1 of 1 slices shown (11 of 16)]
[im 1/1]
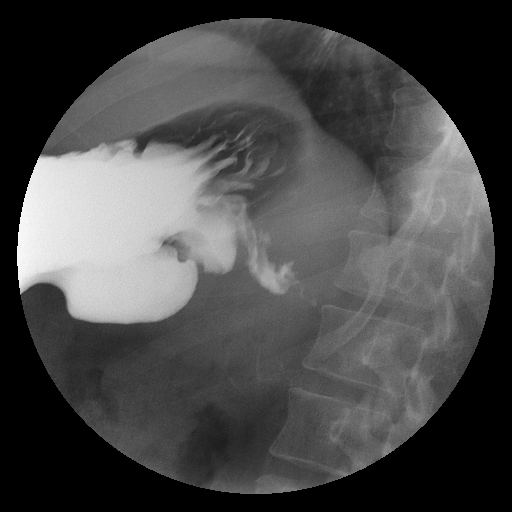

[Series 12: run · 1 of 1 slices shown (12 of 16)]
[im 1/1]
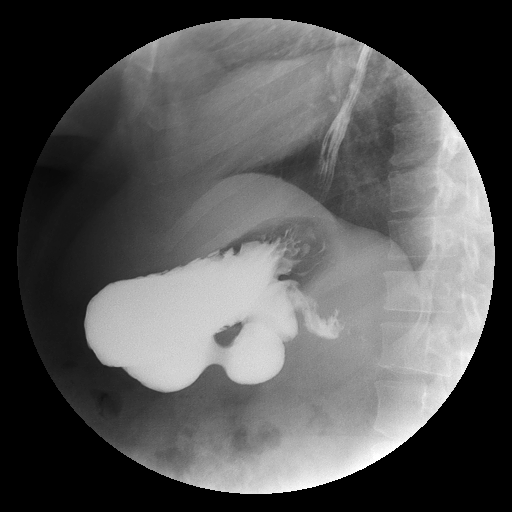

[Series 13: run · 1 of 1 slices shown (13 of 16)]
[im 1/1]
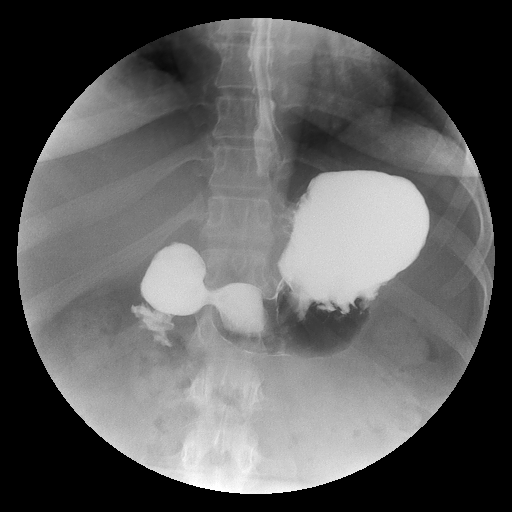

[Series 14: run · 1 of 1 slices shown (14 of 16)]
[im 1/1]
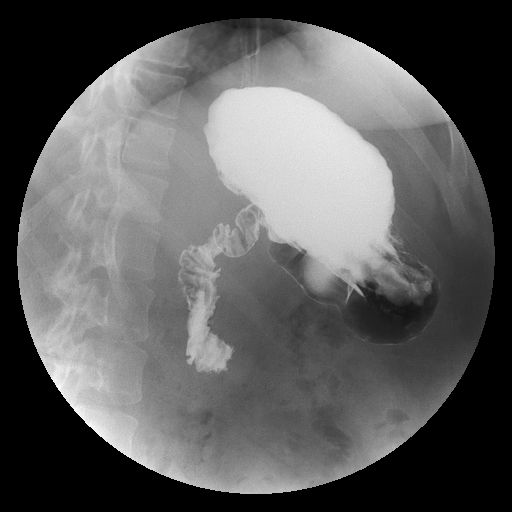

[Series 15: run · 1 of 1 slices shown (15 of 16)]
[im 1/1]
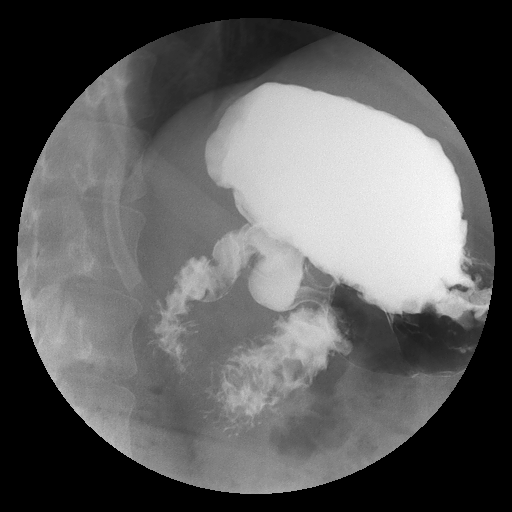

[Series 16: run · 1 of 1 slices shown (16 of 16)]
[im 1/1]
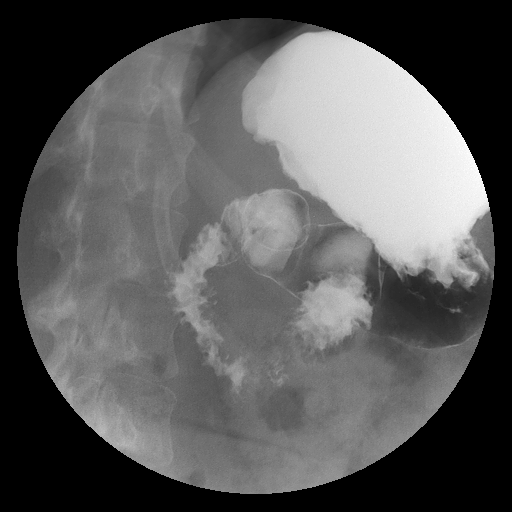

[Series 1001: view not recorded · 0.20mm/px · 1 of 1 slices shown]
[im 1/1]
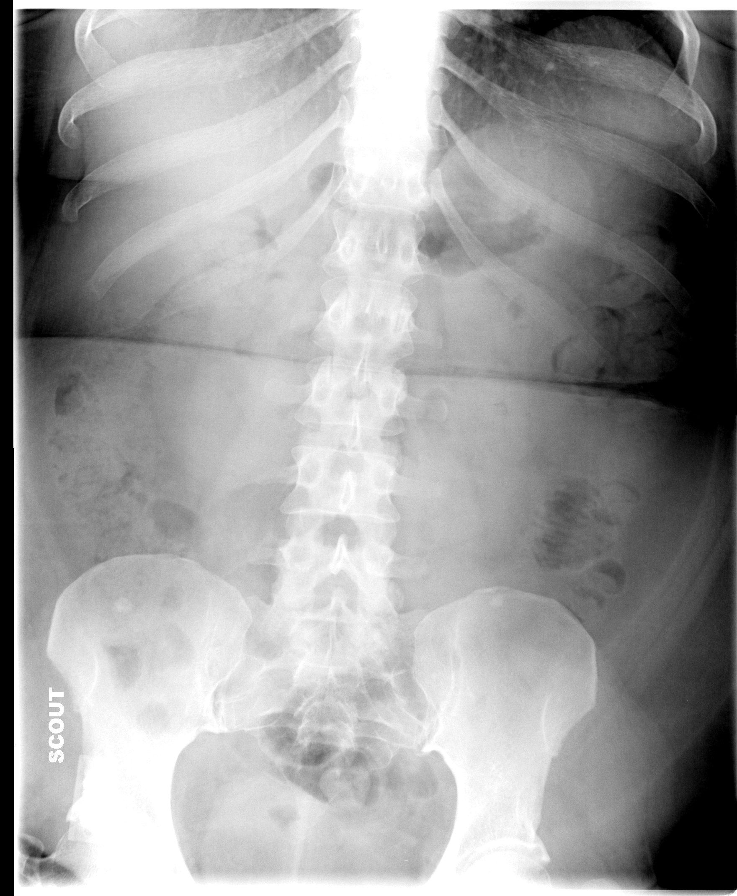

[17 of 17 positions shown; findings below may reference images not displayed]

IMPRESSION: Mild tertiary contractions in distal esophagus.  No other abnormality.

## 2007-03-23 ENCOUNTER — Emergency Department (HOSPITAL_COMMUNITY): Admission: EM | Admit: 2007-03-23 | Discharge: 2007-03-23 | Payer: Self-pay | Admitting: Emergency Medicine

## 2007-05-23 ENCOUNTER — Emergency Department (HOSPITAL_COMMUNITY): Admission: EM | Admit: 2007-05-23 | Discharge: 2007-05-23 | Payer: Self-pay | Admitting: *Deleted

## 2007-08-03 ENCOUNTER — Emergency Department (HOSPITAL_COMMUNITY): Admission: EM | Admit: 2007-08-03 | Discharge: 2007-08-03 | Payer: Self-pay | Admitting: Emergency Medicine

## 2007-10-02 ENCOUNTER — Emergency Department (HOSPITAL_COMMUNITY): Admission: EM | Admit: 2007-10-02 | Discharge: 2007-10-02 | Payer: Self-pay | Admitting: Emergency Medicine

## 2007-10-12 ENCOUNTER — Emergency Department (HOSPITAL_COMMUNITY): Admission: EM | Admit: 2007-10-12 | Discharge: 2007-10-12 | Payer: Self-pay | Admitting: Emergency Medicine

## 2007-12-05 ENCOUNTER — Inpatient Hospital Stay (HOSPITAL_COMMUNITY): Admission: EM | Admit: 2007-12-05 | Discharge: 2007-12-07 | Payer: Self-pay | Admitting: Emergency Medicine

## 2007-12-28 ENCOUNTER — Emergency Department (HOSPITAL_COMMUNITY): Admission: EM | Admit: 2007-12-28 | Discharge: 2007-12-28 | Payer: Self-pay | Admitting: Emergency Medicine

## 2008-01-17 IMAGING — CT CT HEAD W/O CM - CT MAXILLOFACIAL W/O CM
1 series · 15 of 17 positions shown, 19 images · non-contrast
Comparison: NONE

CLINICAL DATA: Sinusitis, headaches, congestion, ears stopped 
up.  

CT OF THE PARANASAL SINUSES
TECHNIQUE: Multiple axial images were obtained 
through the paranasal sinuses.

[Series 2: — · axial · 0.31mm/px · z∈[-325,-227]mm · 15 of 17 slices shown, 19 images]
[im 2/17  brain]
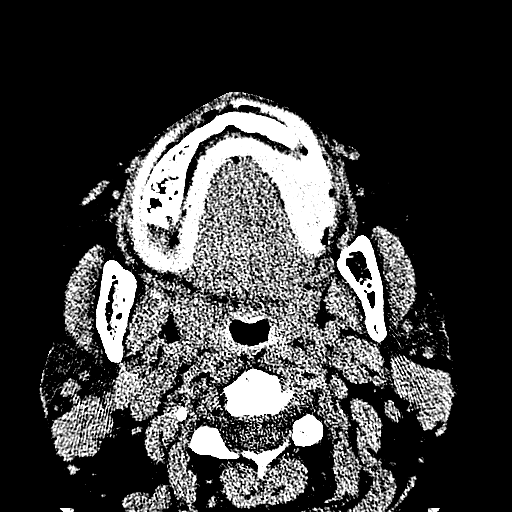
[im 2/17  bone]
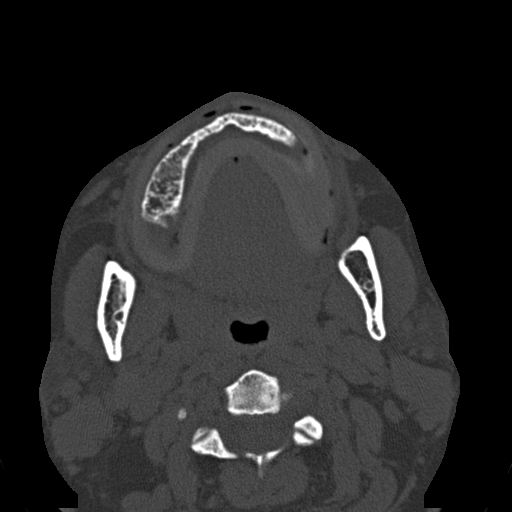
[im 3/17  bone]
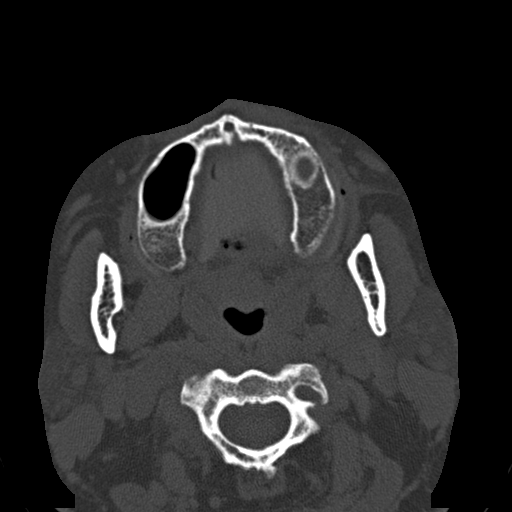
[im 4/17  bone]
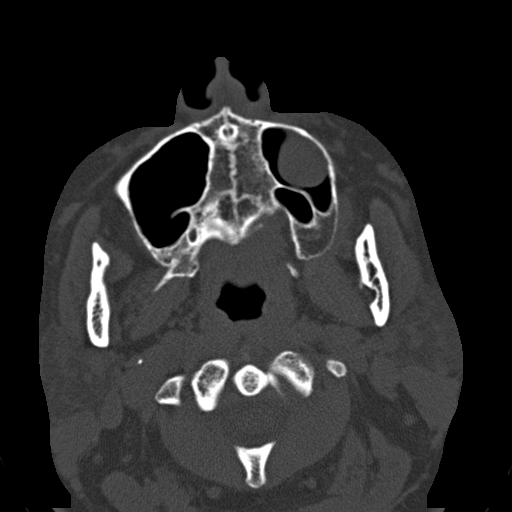
[im 5/17  bone]
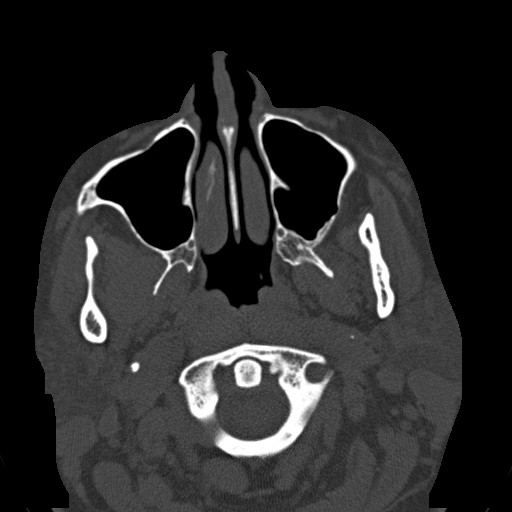
[im 6/17  brain]
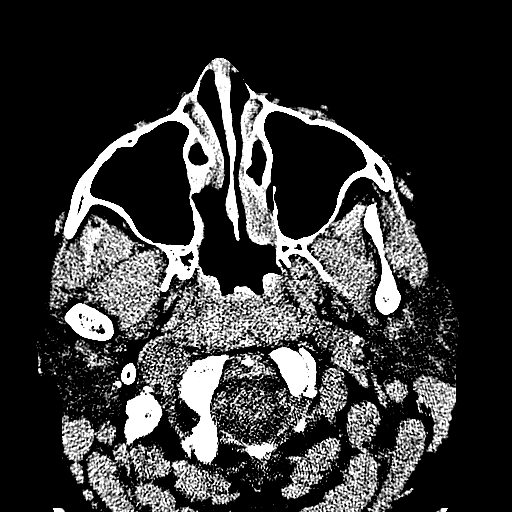
[im 6/17  bone]
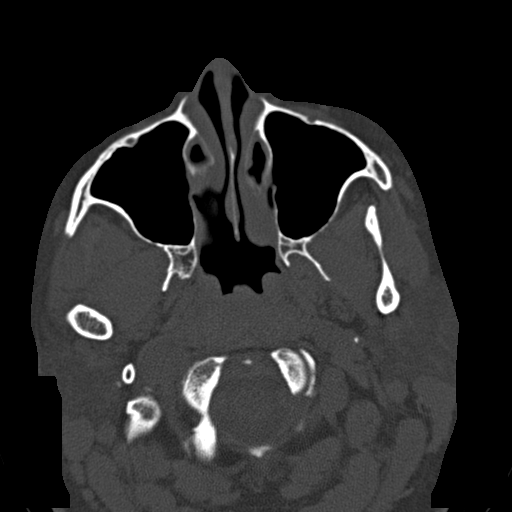
[im 7/17  bone]
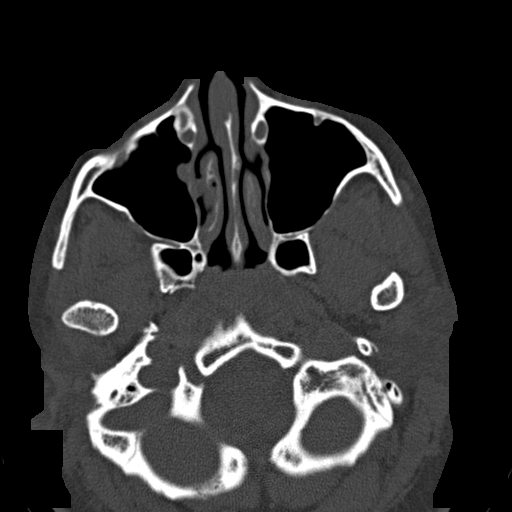
[im 8/17  bone]
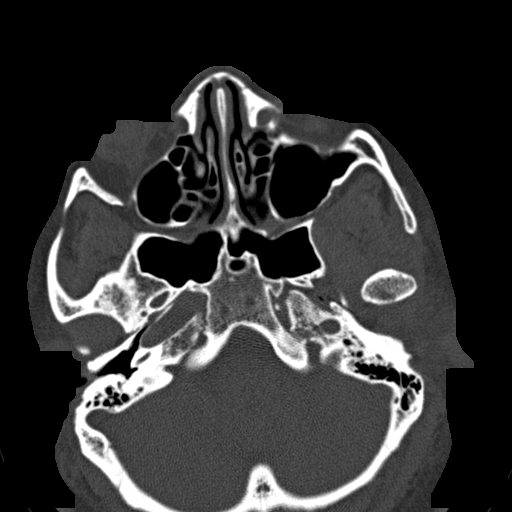
[im 9/17  bone]
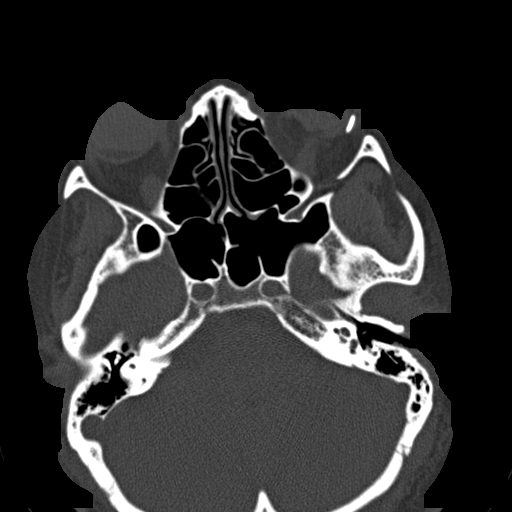
[im 10/17  brain]
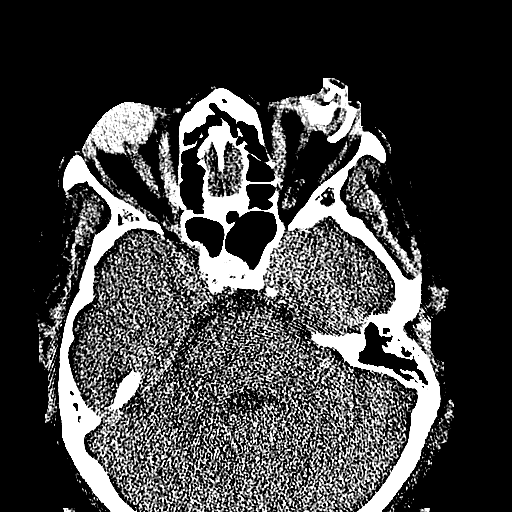
[im 10/17  bone]
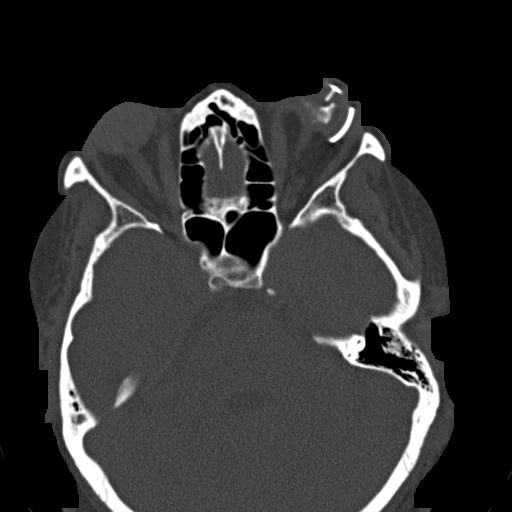
[im 11/17  bone]
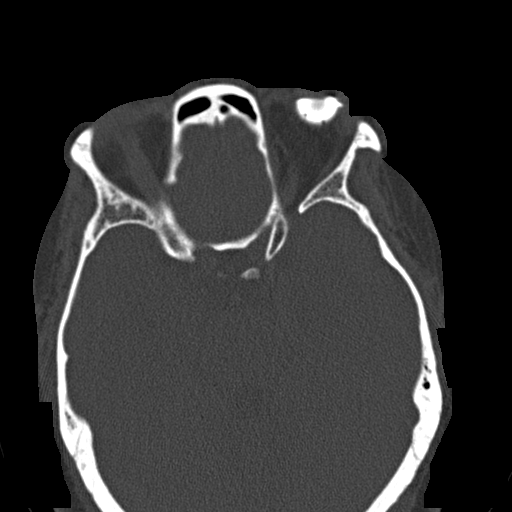
[im 12/17  bone]
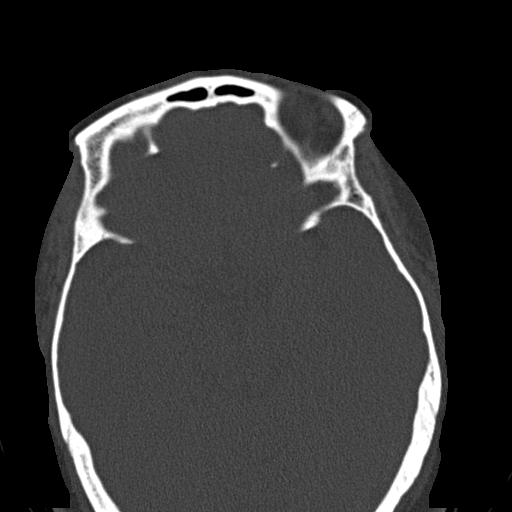
[im 13/17  bone]
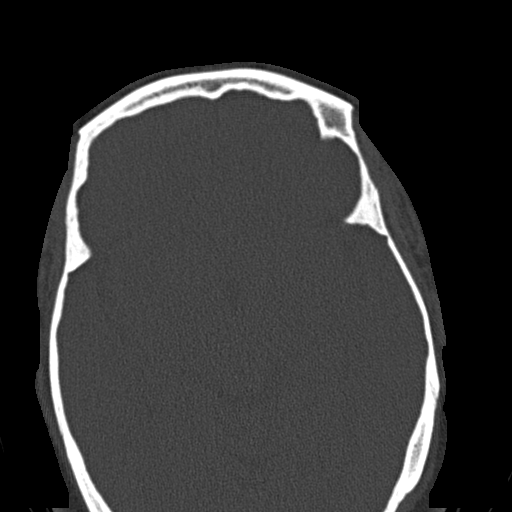
[im 14/17  brain]
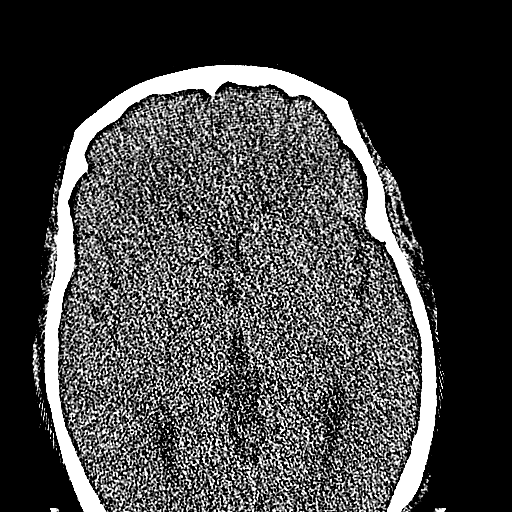
[im 14/17  bone]
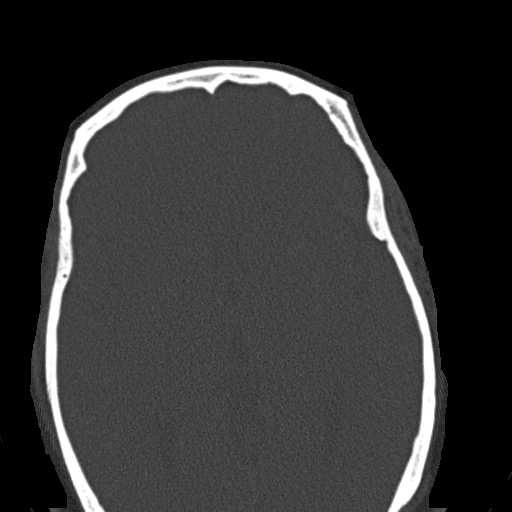
[im 15/17  bone]
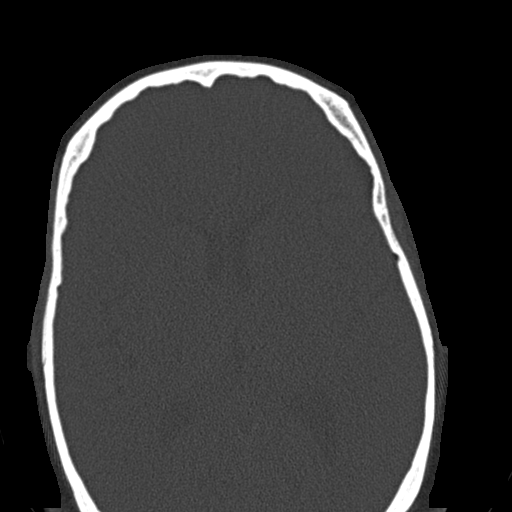
[im 16/17  bone]
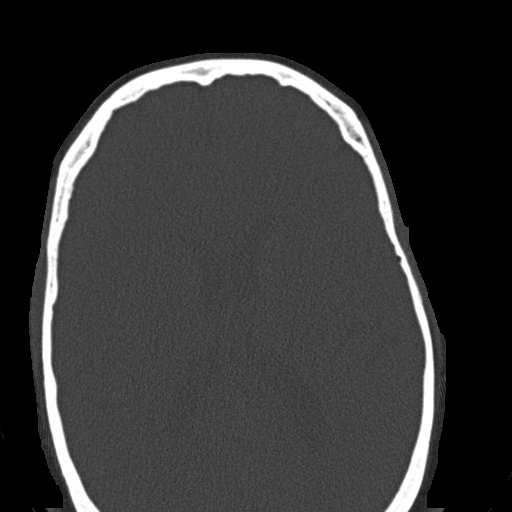

[15 of 17 positions shown; findings below may reference images not displayed]

FINDINGS: Limited axial sinus CT shows a 2 cm mucous retention 
cyst in the base of the left maxillary sinus.  There is a second 
tiny polyp or mucous retention cyst in the medial wall of the 
right maxillary sinus about 7 mm in diameter.  No air-fluid level, 
no bony destruction or expansion.  Prosthetic left eye is noted.
IMPRESSION: Mucous retention cysts or polyps in the maxillary 
12/14/2005 Dict Date: 12/14/2005  Tran Date: 12/14/2005 [facility]

## 2008-02-18 ENCOUNTER — Emergency Department (HOSPITAL_COMMUNITY): Admission: EM | Admit: 2008-02-18 | Discharge: 2008-02-18 | Payer: Self-pay | Admitting: Emergency Medicine

## 2008-02-19 ENCOUNTER — Emergency Department (HOSPITAL_COMMUNITY): Admission: EM | Admit: 2008-02-19 | Discharge: 2008-02-20 | Payer: Self-pay | Admitting: Emergency Medicine

## 2008-02-20 ENCOUNTER — Ambulatory Visit (HOSPITAL_COMMUNITY): Admission: RE | Admit: 2008-02-20 | Discharge: 2008-02-20 | Payer: Self-pay | Admitting: Emergency Medicine

## 2008-02-28 ENCOUNTER — Emergency Department (HOSPITAL_COMMUNITY): Admission: EM | Admit: 2008-02-28 | Discharge: 2008-02-28 | Payer: Self-pay | Admitting: Emergency Medicine

## 2008-04-08 ENCOUNTER — Encounter: Admission: RE | Admit: 2008-04-08 | Discharge: 2008-04-08 | Payer: Self-pay | Admitting: Family Medicine

## 2008-04-12 ENCOUNTER — Emergency Department (HOSPITAL_COMMUNITY): Admission: EM | Admit: 2008-04-12 | Discharge: 2008-04-12 | Payer: Self-pay | Admitting: Emergency Medicine

## 2008-04-27 ENCOUNTER — Emergency Department (HOSPITAL_COMMUNITY): Admission: EM | Admit: 2008-04-27 | Discharge: 2008-04-27 | Payer: Self-pay | Admitting: Emergency Medicine

## 2008-06-13 ENCOUNTER — Encounter (INDEPENDENT_AMBULATORY_CARE_PROVIDER_SITE_OTHER): Payer: Self-pay | Admitting: General Surgery

## 2008-06-13 ENCOUNTER — Ambulatory Visit (HOSPITAL_COMMUNITY): Admission: RE | Admit: 2008-06-13 | Discharge: 2008-06-14 | Payer: Self-pay | Admitting: General Surgery

## 2008-07-18 ENCOUNTER — Emergency Department (HOSPITAL_COMMUNITY): Admission: EM | Admit: 2008-07-18 | Discharge: 2008-07-19 | Payer: Self-pay | Admitting: Emergency Medicine

## 2008-08-02 IMAGING — CR DG CHEST 2V
2 series · 2 of 2 positions shown · non-contrast
Comparison: 01/01/05.

CLINICAL DATA: Shortness of breath. Chest pain. 
 CHEST - 2 VIEW:

[w chest lat]
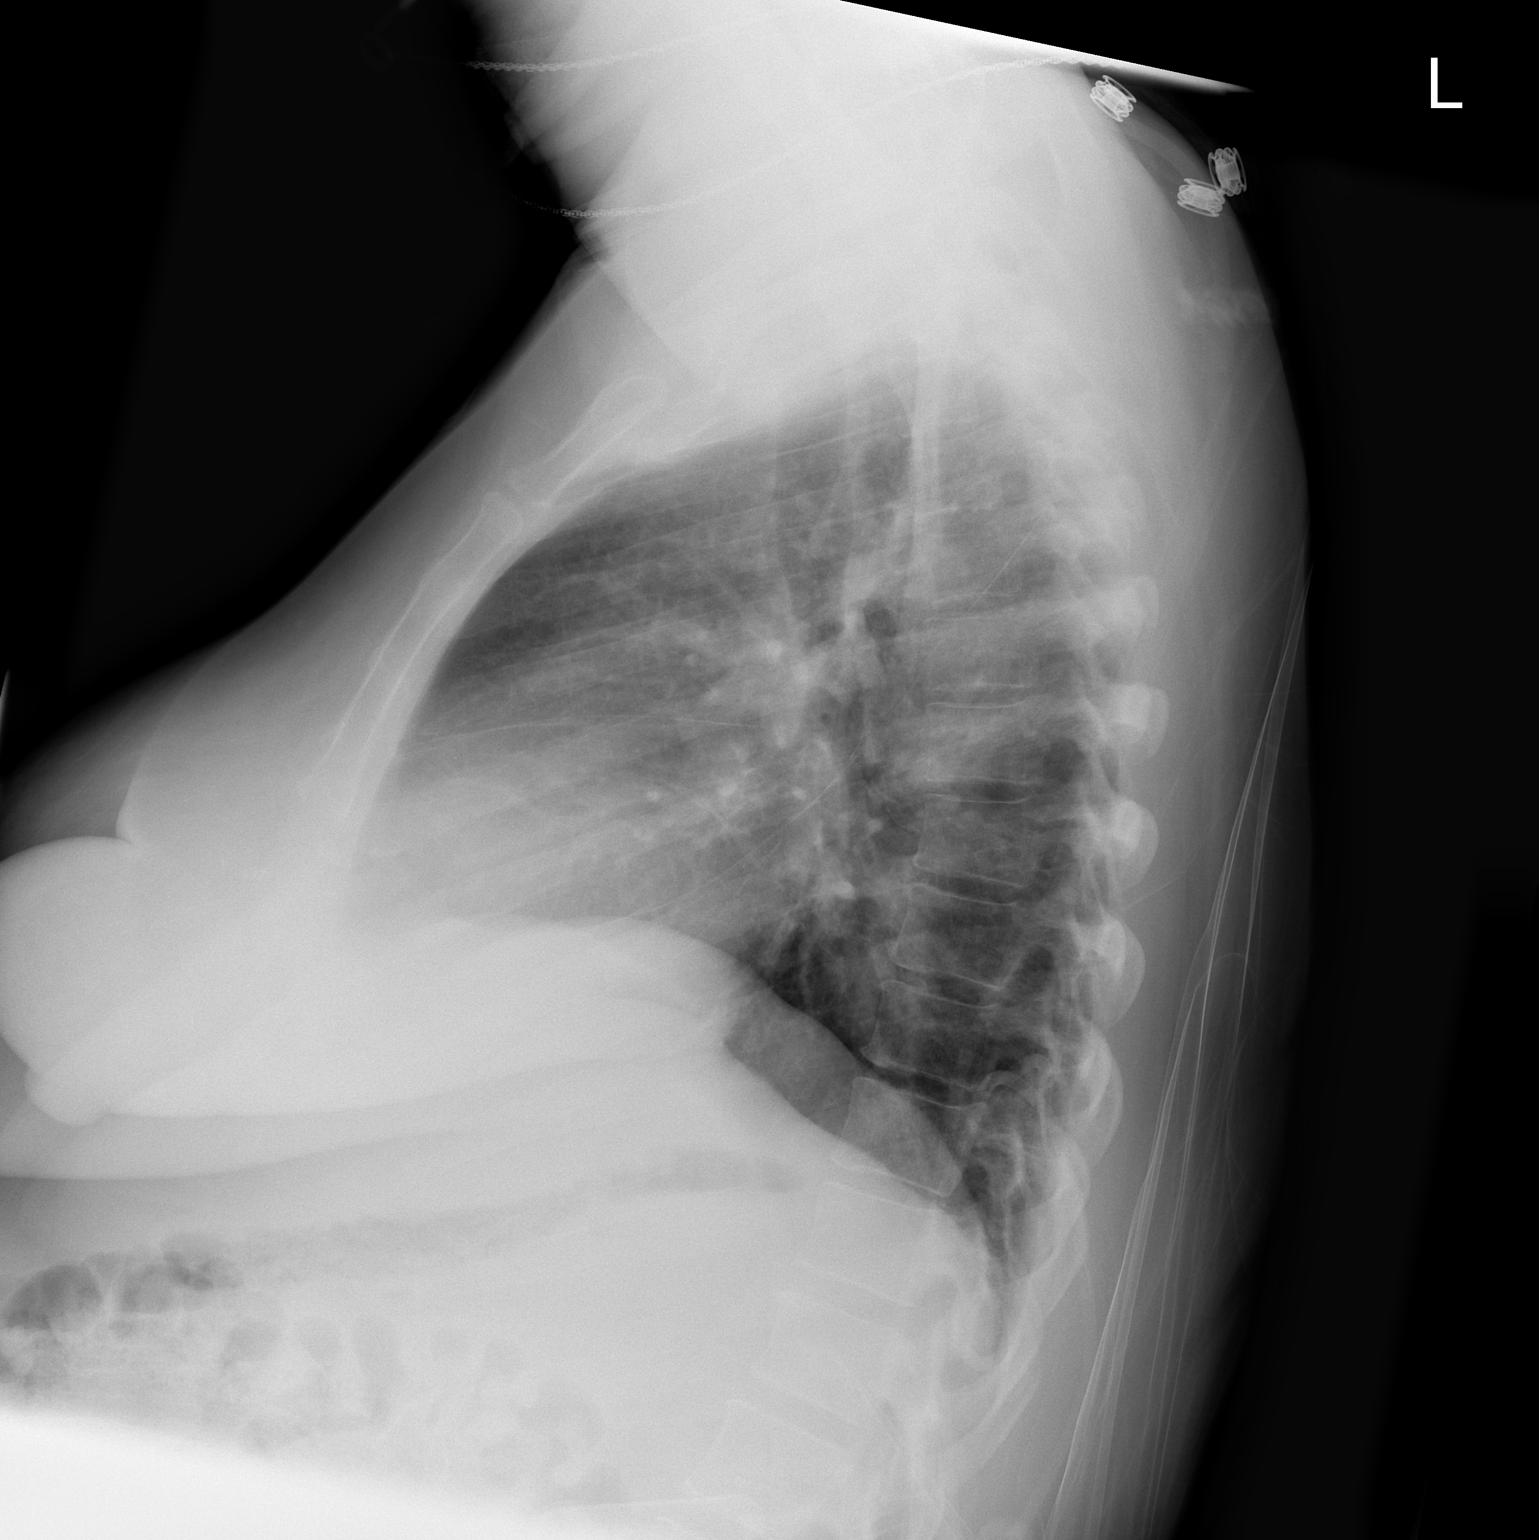

[w chest pa]
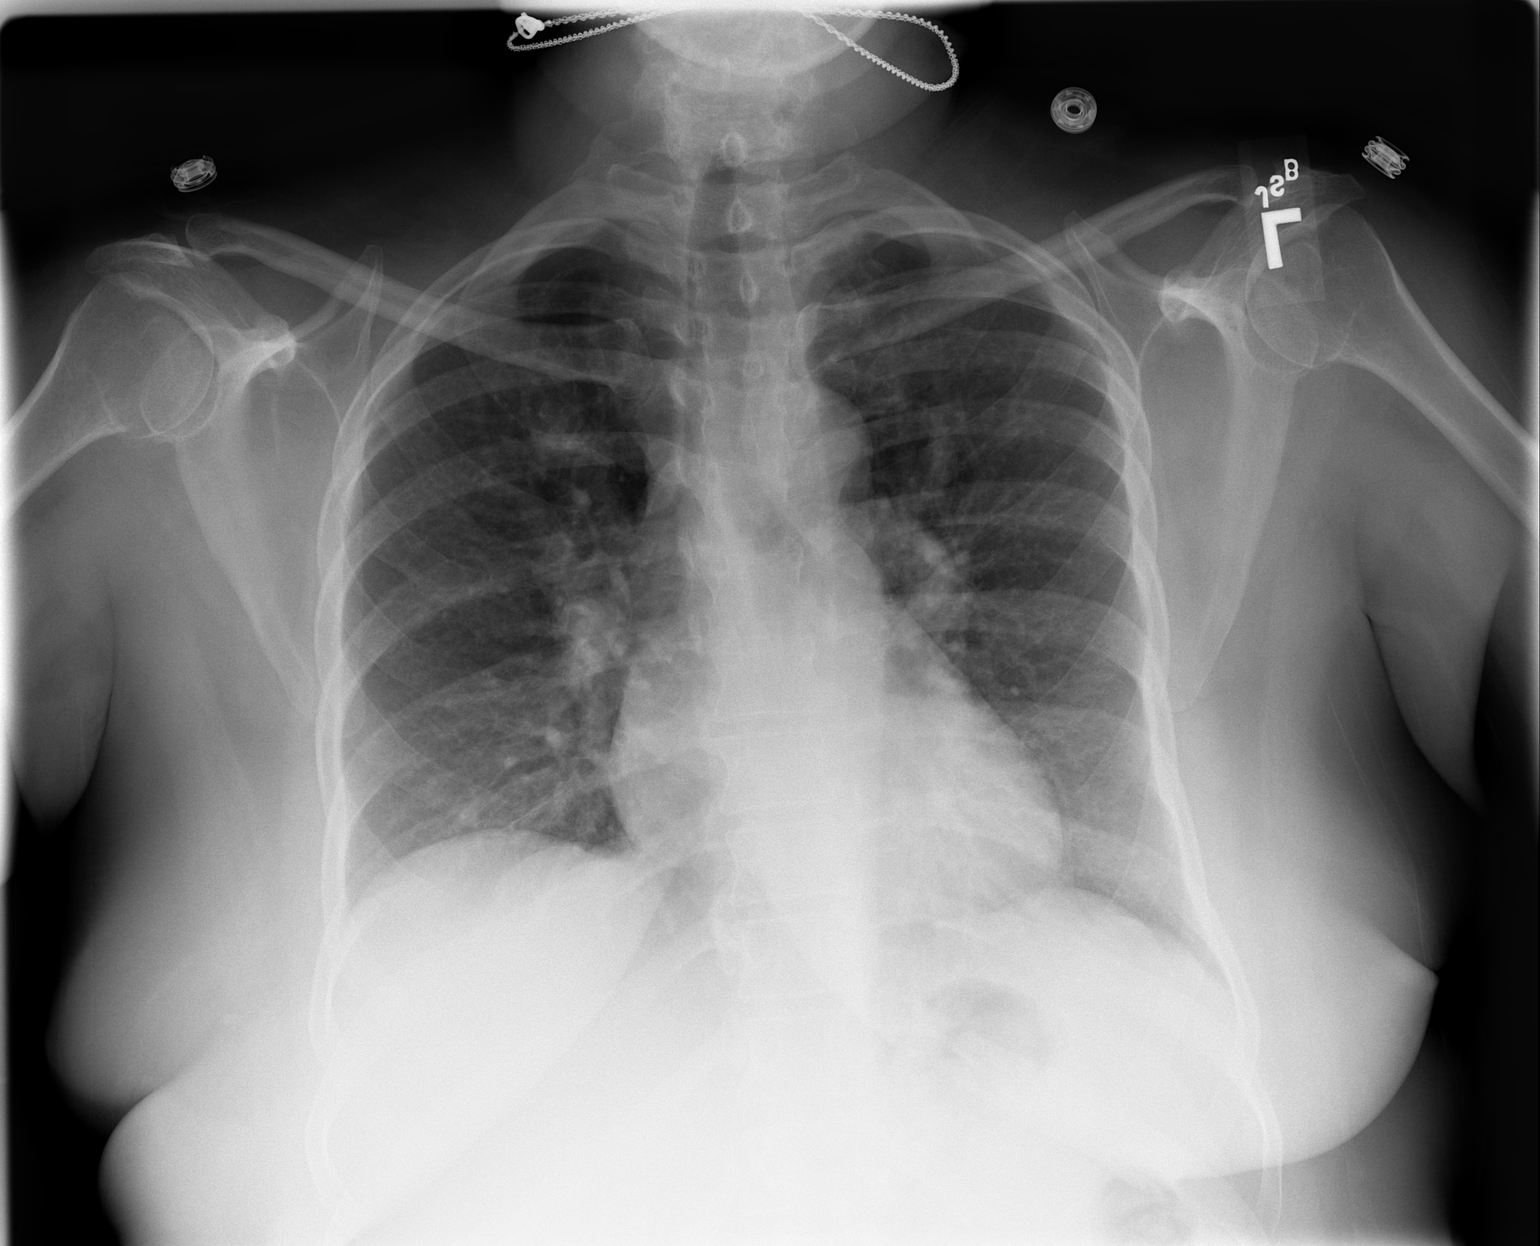

[2 of 2 positions shown; findings below may reference images not displayed]

FINDINGS: The heart is mildly enlarged. There is no heart failure, infiltrate, or effusion.  Mild scar or atelectasis in the right lower lobe is noted.
IMPRESSION: No acute abnormality.

## 2008-08-04 ENCOUNTER — Emergency Department (HOSPITAL_COMMUNITY): Admission: EM | Admit: 2008-08-04 | Discharge: 2008-08-04 | Payer: Self-pay | Admitting: Family Medicine

## 2008-08-04 IMAGING — US US ABDOMEN COMPLETE
1 series · 14 of 25 positions shown · non-contrast
Comparison: none

CLINICAL DATA: Abdominal pain. 
 ABDOMEN ULTRASOUND:
TECHNIQUE: Complete abdominal ultrasound examination was performed including evaluation of the liver, gallbladder, bile ducts, pancreas, kidneys, spleen, IVC, and abdominal aorta.

[Series 1: unknown · 0.37mm/px · 14 of 65 slices shown]
[im 1/65]
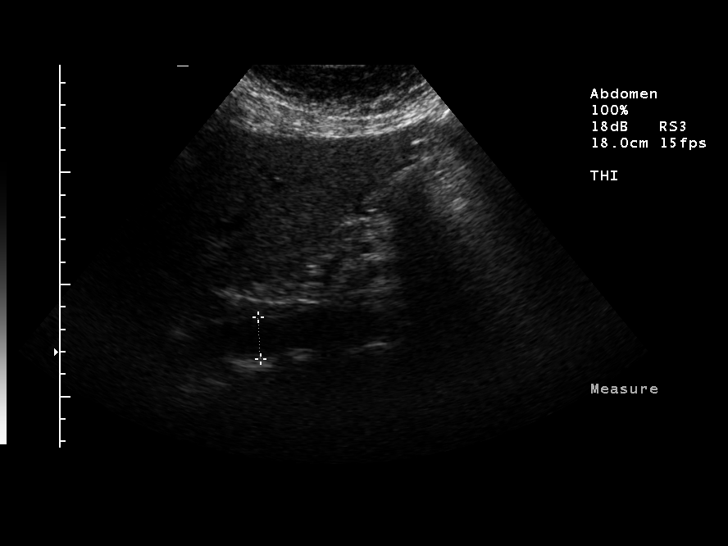
[im 6/65]
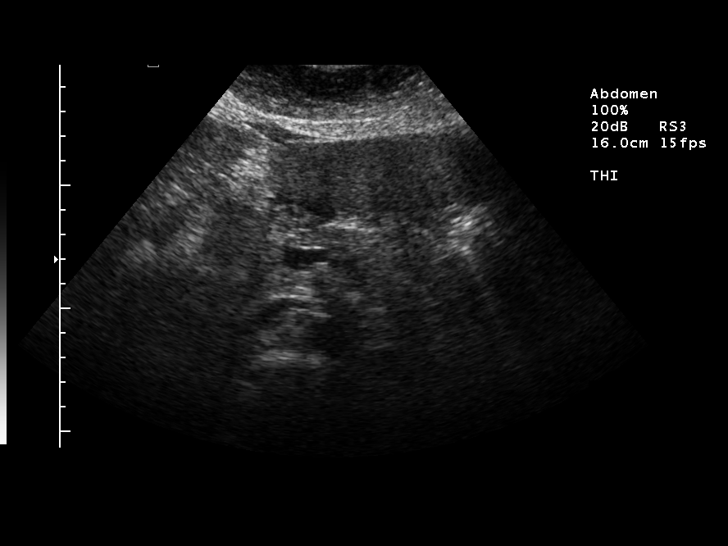
[im 11/65]
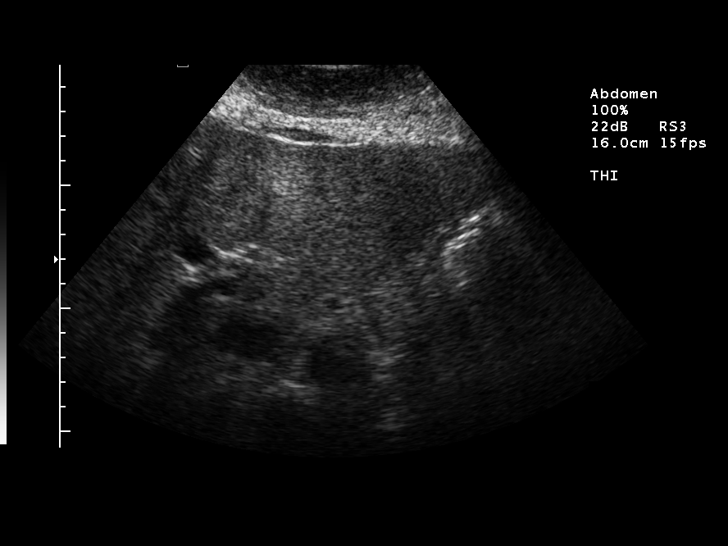
[im 17/65]
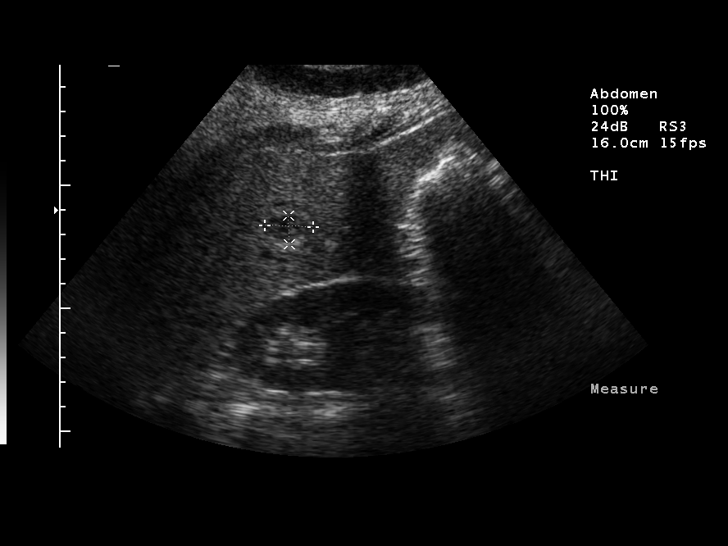
[im 22/65]
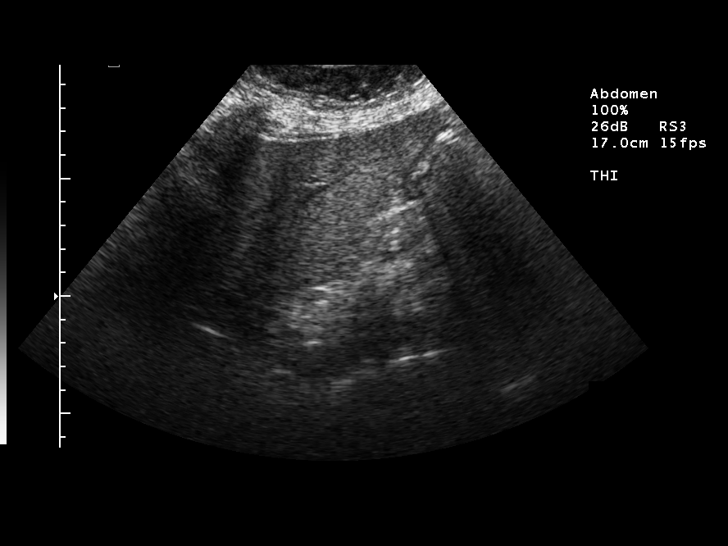
[im 25/65]
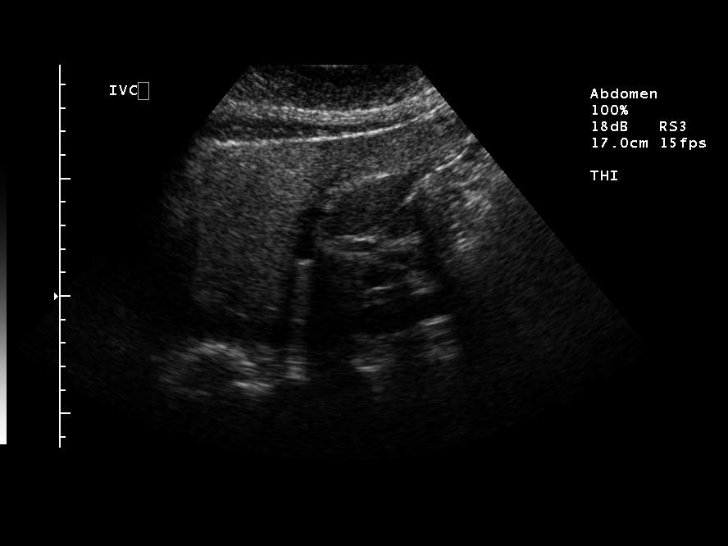
[im 30/65]
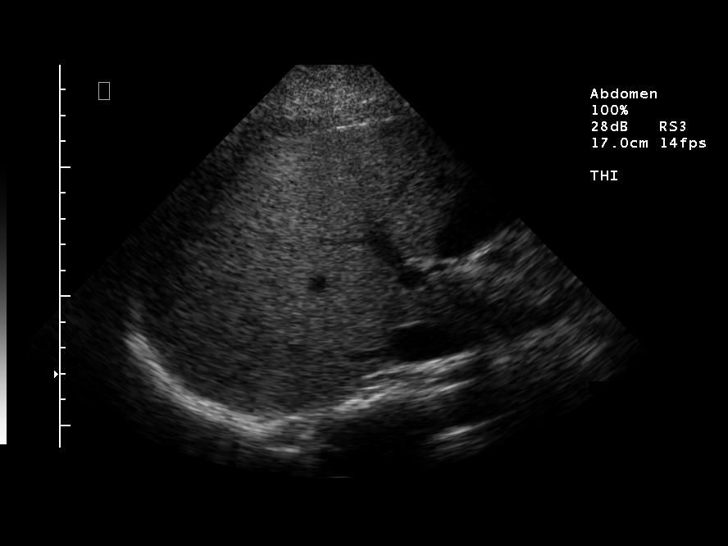
[im 35/65]
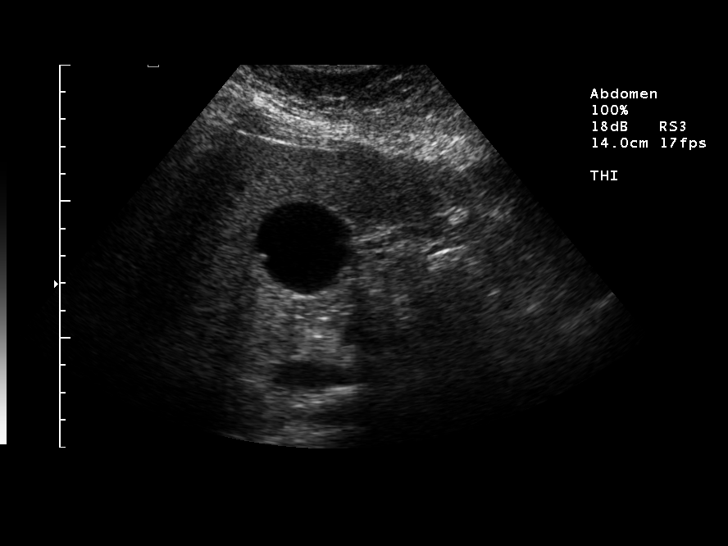
[im 41/65]
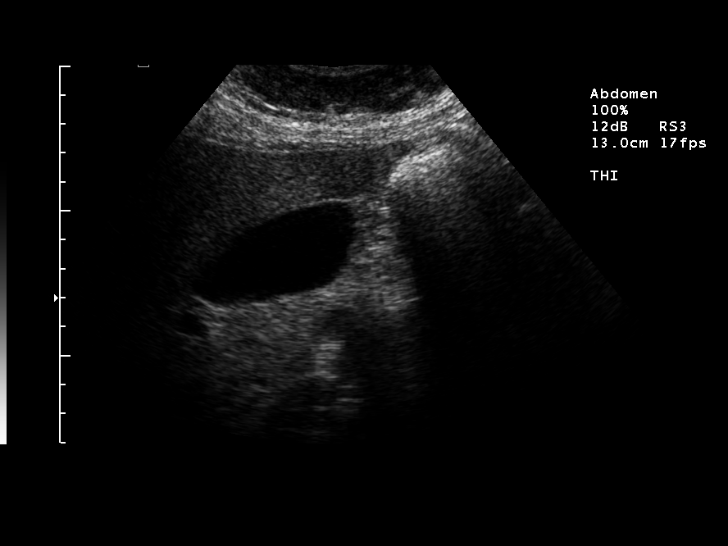
[im 43/65]
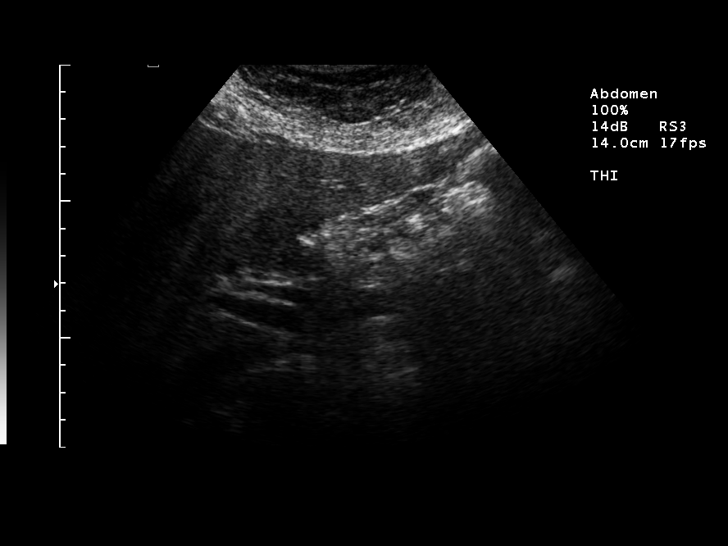
[im 49/65]
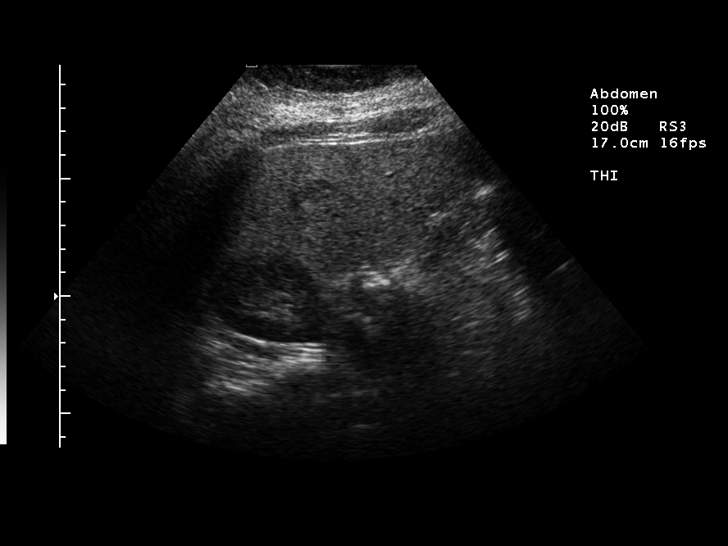
[im 54/65]
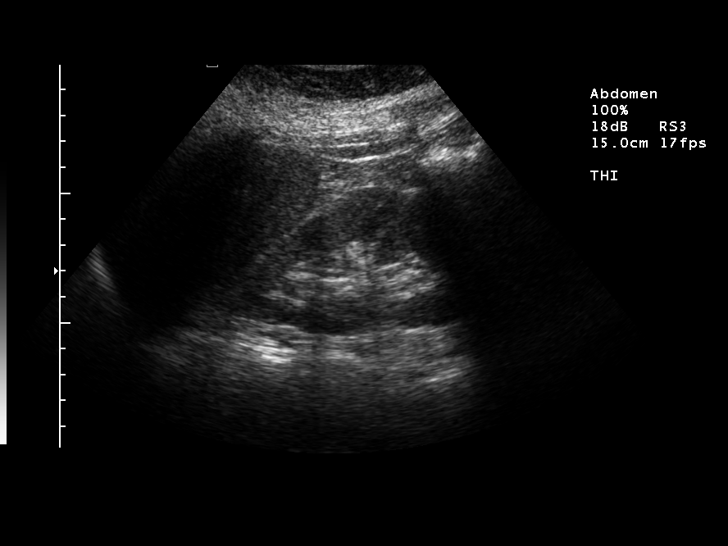
[im 59/65]
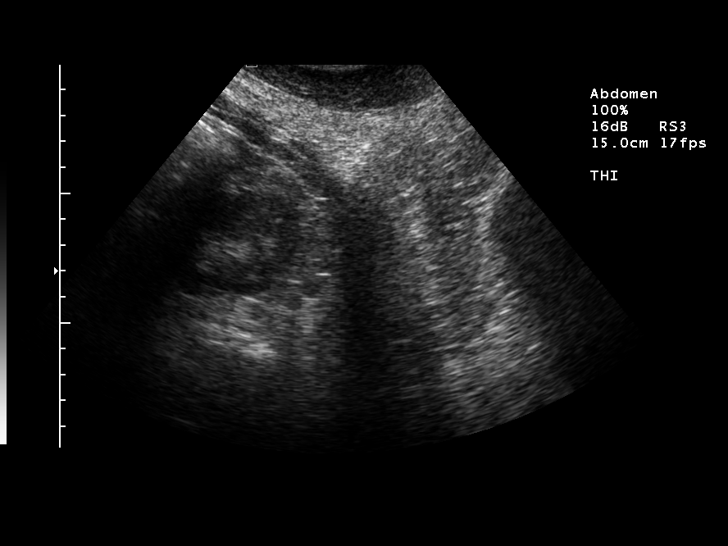
[im 65/65]
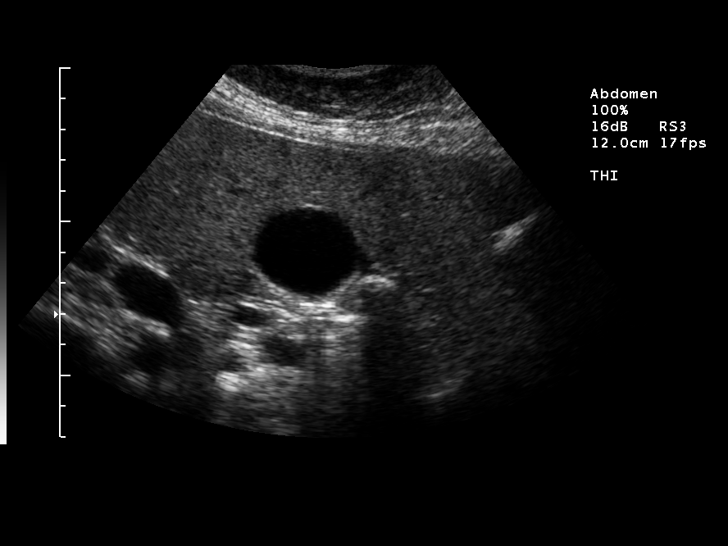

[14 of 25 positions shown; findings below may reference images not displayed]

FINDINGS: A small gallbladder polyp is seen.  No definite stones.  There is a small amount of free fluid around the gallbladder.  The gallbladder wall is not thickened, measuring 1.8 mm.  The common bile duct is 5.8 mm.  The liver is echogenic with fatty infiltration.  There is a 2 x 1.4 cm solid mass in the right lobe which may be a hemangioma, but is nonspecific.  The IVC, pancreas, spleen, kidneys, and aorta are normal.  The pancreatic tail and distal aorta are not well seen due to bowel gas.
IMPRESSION: 1.  Gallbladder polyp without stones.  Small amount of fluid around the gallbladder, nonspecific as to whether this is related to cholecystitis.
 2.  2 cm hypoechoic solid liver lesion which is indeterminate.  This could be followed with CT and ultrasound, and correlation with the patient?s medical history is suggested.

## 2008-08-05 IMAGING — CR DG CHEST 1V PORT
1 series · 1 of 1 positions shown · non-contrast
Comparison: none

CLINICAL DATA: Patient has persistent vomiting.  
 PORTABLE CHEST - 1 VIEW:

[view not recorded]
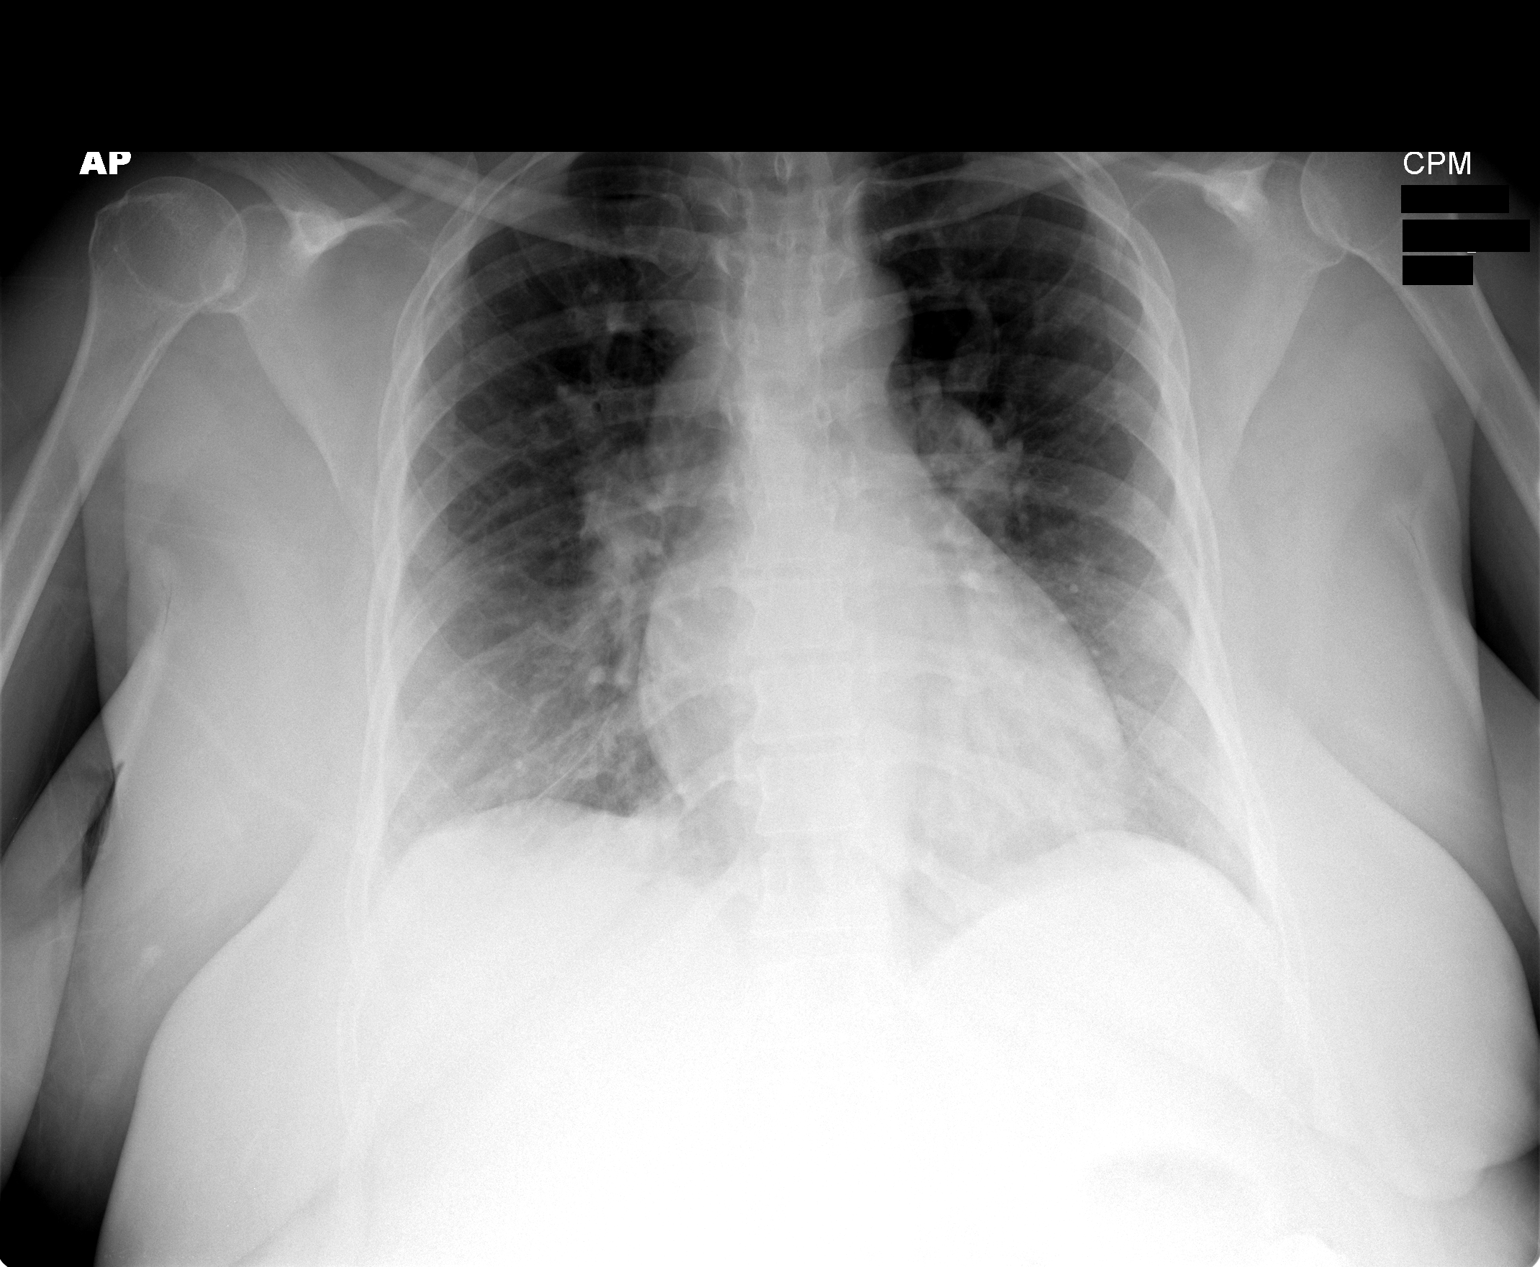

[1 of 1 positions shown; findings below may reference images not displayed]

FINDINGS: AP view of the chest with comparison films of [DATE] reveals the heart size to be prominent.  Markings remain accentuated particularly in the right lung base but again no definite active findings.
IMPRESSION: No active disease.

## 2008-08-05 IMAGING — CT CT ABDOMEN W/ CM
2 of 5 series · 16 of 46 positions shown, 18 images · IV contrast (omnipaque)
Comparison: Ultrasound 07/02/2005

ABDOMEN CT WITH CONTRAST

Addendum Begins
Addendum: When the patient was being returned to the emergency department, the
patient began complaining of itching. The patient had developed hives. The
patient also experienced nasal congestion. Therefore, the patient does have an
allergy to the iodinated contrast media. This development was relate to the
patient's nurse within the emergency room by the radiology technologist.
Addendum Ends
CLINICAL DATA: Fever, lower abdominal pain, vomiting
TECHNIQUE: Multidetector CT imaging of the abdomen and pelvis was performed
following the standard protocol during bolus administration of intravenous
contrast.

Contrast:  125 cc Omnipaque 300

[Series 2: abd_pel 5.0 b40f st · axial · 0.71mm/px · z∈[+807,+1202]mm · 13 of 89 slices shown, 15 images]
[im 5/89  soft-tissue]
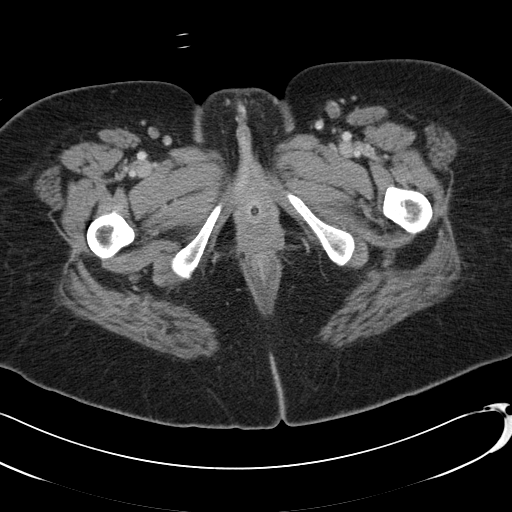
[im 5/89  bone]
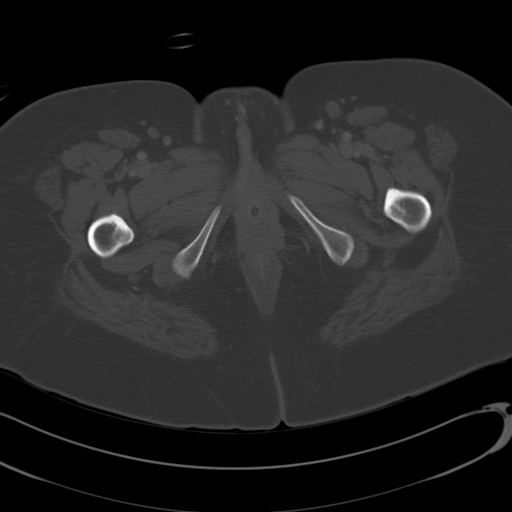
[im 14/89  soft-tissue]
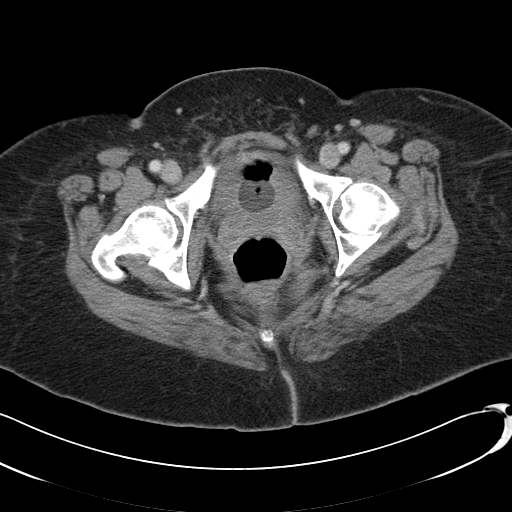
[im 19/89  soft-tissue]
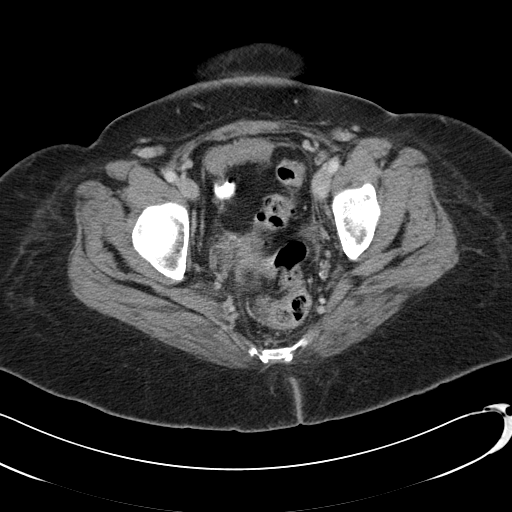
[im 24/89  soft-tissue]
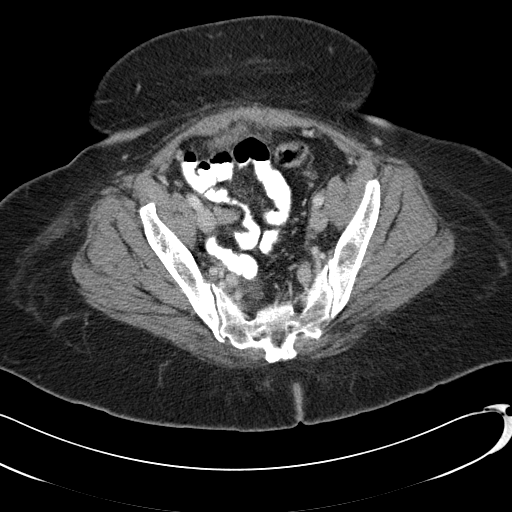
[im 33/89  soft-tissue]
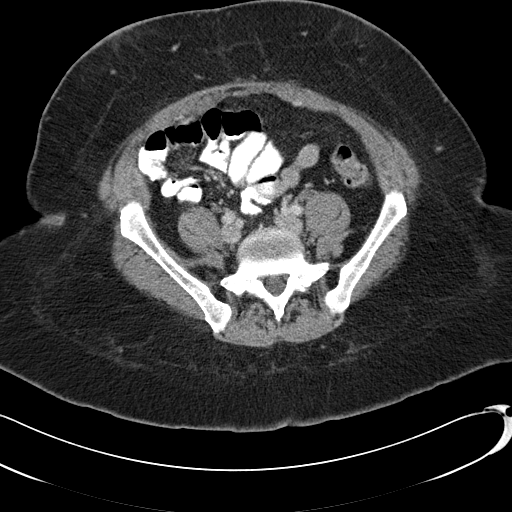
[im 38/89  soft-tissue]
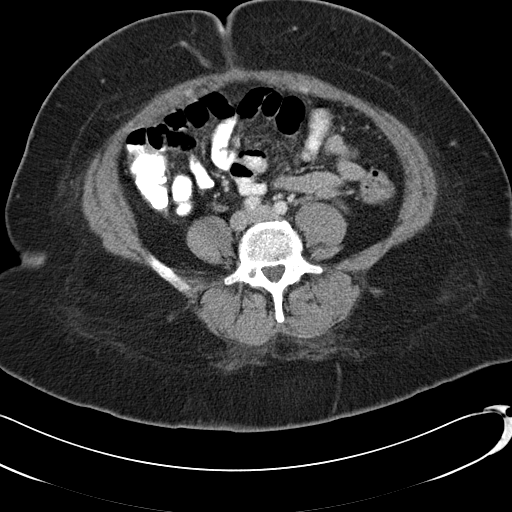
[im 47/89  soft-tissue]
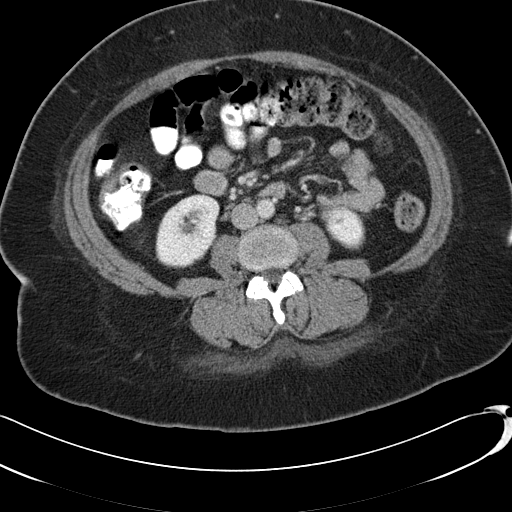
[im 51/89  soft-tissue]
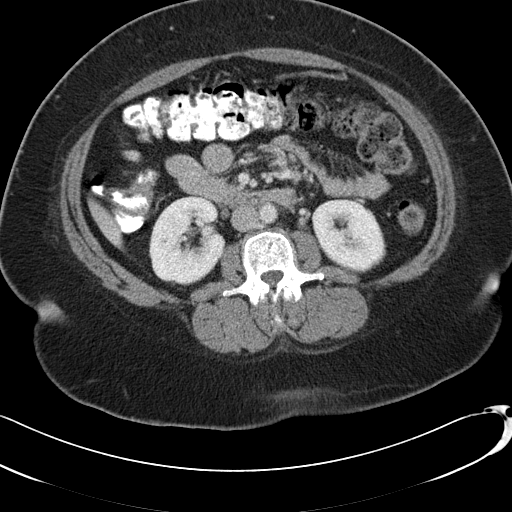
[im 56/89  soft-tissue]
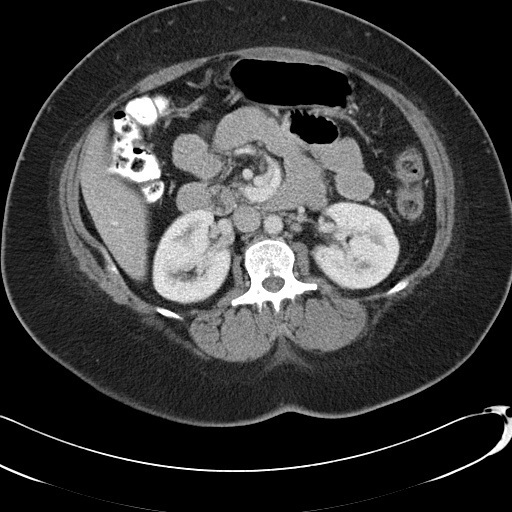
[im 56/89  bone]
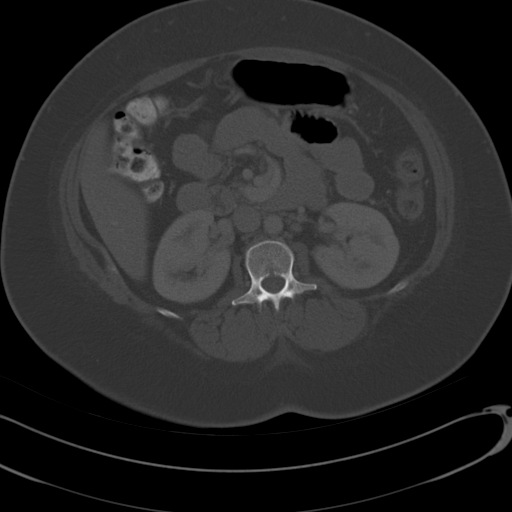
[im 65/89  soft-tissue]
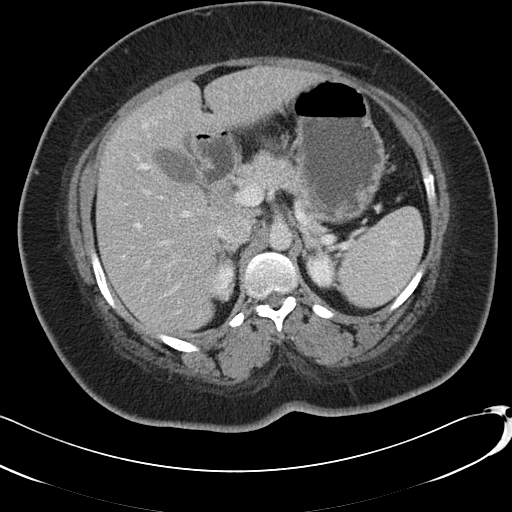
[im 70/89  soft-tissue]
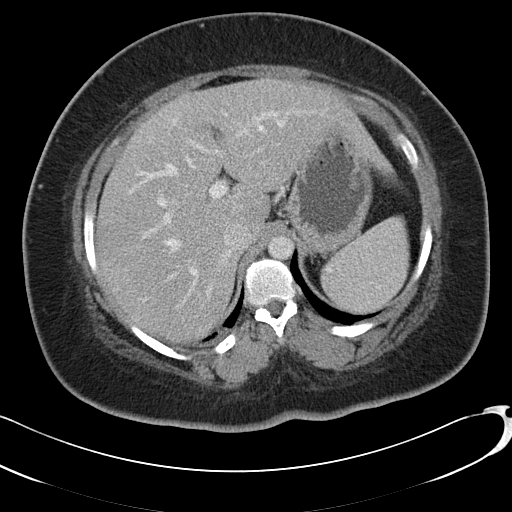
[im 75/89  soft-tissue]
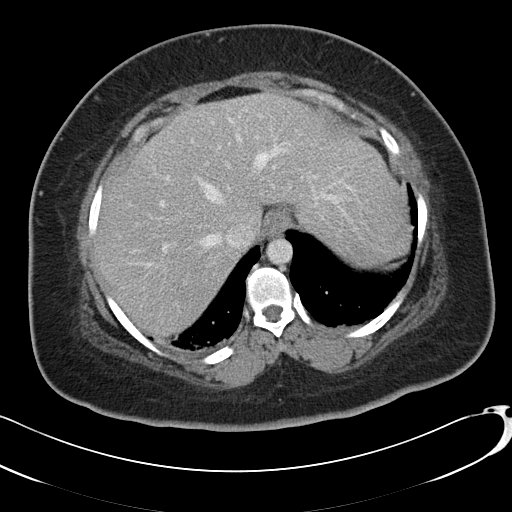
[im 84/89  soft-tissue]
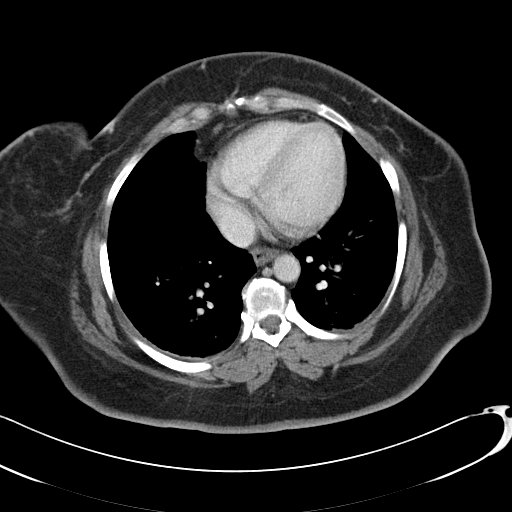

[Series 602: coronal images · coronal · 0.90mm/px · 3 of 85 slices shown]
[im 29/85  soft-tissue]
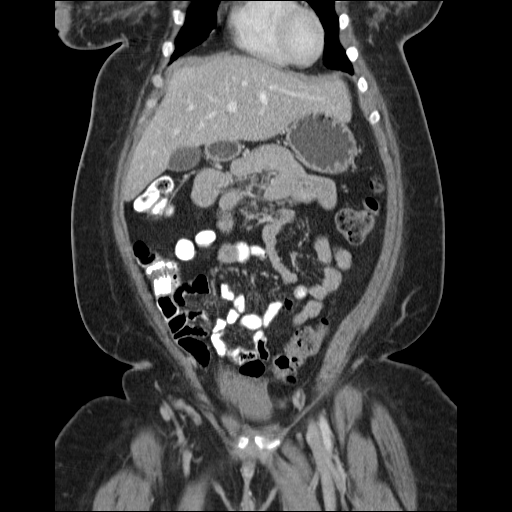
[im 38/85  soft-tissue]
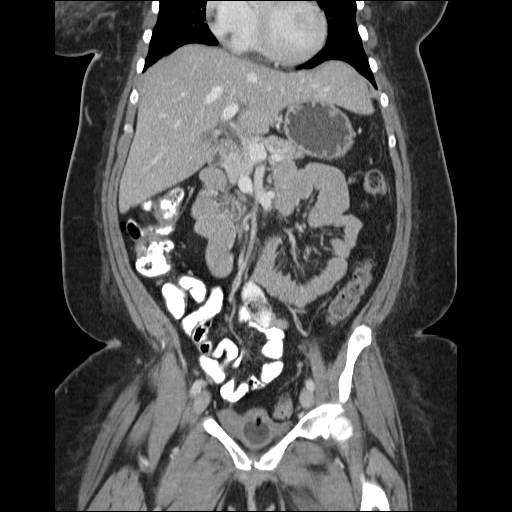
[im 47/85  soft-tissue]
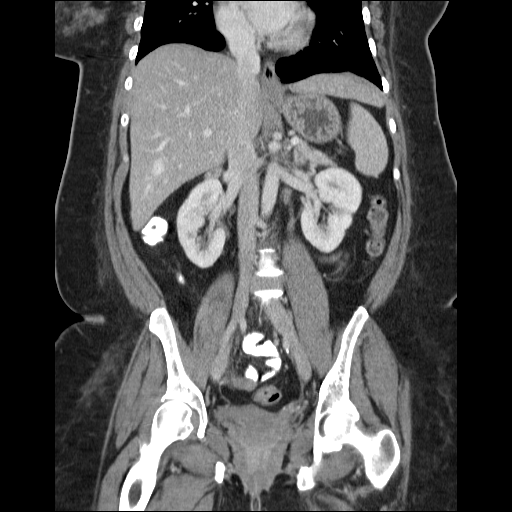

[16 of 46 positions shown; findings below may reference images not displayed]

FINDINGS: Within the right lobe of the liver, there is a 1.5 cm
hyperattenuating lesion noted, corresponding to the hypoechoic nodule by
ultrasound. Without the same attenuation as adjacent vasculature on both portal
venous phase and delayed images. No other focal lesions in the liver. Spleen,
pancreas, adrenals, kidneys unremarkable. No free fluid, free air, or
adenopathy. Appendix is visualized and is normal.

There is bibasilar atelectasis present. Visualized skeleton unremarkable.

IMPRESSION

Hyperattenuating 1.5 cm lesion in the right lobe of the liver, with enhancement
characteristics similar to that of the adjacent vasculature within the liver.
This could represent hyper enhancing hemangioma, although this is not
hyperechoic on yesterday's ultrasound. This could also represent a small AVM
given its similar density at the adjacent vasculature. Cannot exclude other
cause of hypervascular lesion such as hypervascular metastasis. Recommend
further evaluation with dynamic a MRI.

Bibasilar atelectasis.

PELVIS CT WITH CONTRAST
FINDINGS: No free fluid, free air, or adenopathy. Foley catheter is present
within the bladder. Bowel grossly unremarkable.

Within both iliac crests, there are small sclerotic foci. A fair these represent
bone islands.

IMPRESSION

No acute findings in the pelvis.

Small sclerotic foci within the iliac crests bilaterally. I favor these
represent bone islands. However, if there any history of cancer, these can be
further evaluated with bone scan.

## 2008-08-05 IMAGING — NM NM LIVER FUNCTION STUDY
1 series · 6 of 6 positions shown · non-contrast
Comparison: none

CLINICAL DATA: Respiratory distress, vomiting 

Hepatobiliary scintigraphy:
Anterior imaging after 5.2 mCi 2cLLE Choletec IV. There is prompt clearance of
the radiopharmaceutical from the blood pool. Timely visualization of activity in
central bile ducts, small bowel, and gallbladder.

[hepato · 2.40mm/px · 6 of 60 frames shown]
[frame 6/60]
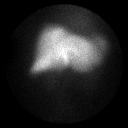
[frame 16/60]
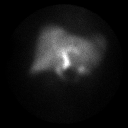
[frame 26/60]
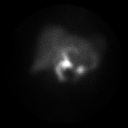
[frame 36/60]
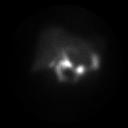
[frame 46/60]
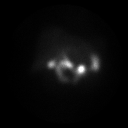
[frame 56/60]
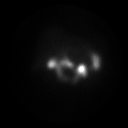

[6 of 6 positions shown; findings below may reference images not displayed]

IMPRESSION: 1. Patency of cystic and common bile ducts.

## 2008-08-24 ENCOUNTER — Emergency Department (HOSPITAL_COMMUNITY): Admission: EM | Admit: 2008-08-24 | Discharge: 2008-08-24 | Payer: Self-pay | Admitting: Family Medicine

## 2008-09-01 ENCOUNTER — Emergency Department (HOSPITAL_COMMUNITY): Admission: EM | Admit: 2008-09-01 | Discharge: 2008-09-02 | Payer: Self-pay | Admitting: Emergency Medicine

## 2008-10-20 ENCOUNTER — Emergency Department (HOSPITAL_COMMUNITY): Admission: EM | Admit: 2008-10-20 | Discharge: 2008-10-20 | Payer: Self-pay | Admitting: Emergency Medicine

## 2008-12-20 ENCOUNTER — Emergency Department (HOSPITAL_COMMUNITY): Admission: EM | Admit: 2008-12-20 | Discharge: 2008-12-20 | Payer: Self-pay | Admitting: Emergency Medicine

## 2009-01-16 ENCOUNTER — Emergency Department (HOSPITAL_COMMUNITY): Admission: EM | Admit: 2009-01-16 | Discharge: 2009-01-16 | Payer: Self-pay | Admitting: Family Medicine

## 2009-02-03 IMAGING — CT CT PELVIS W/O CM
2 of 4 series · 17 of 46 positions shown, 19 images · non-contrast
Comparison: 07/03/2006

ABDOMEN CT WITHOUT CONTRAST:

CLINICAL DATA: Left lower quadrant pain
TECHNIQUE: Multidetector CT imaging of the abdomen and pelvis was performed
following the standard protocol without intravenous contrast.

[Series 2: abd_pel 5.0 b40f st · axial · 0.68mm/px · z∈[-463,-68]mm · 14 of 87 slices shown, 16 images]
[im 4/87  soft-tissue]
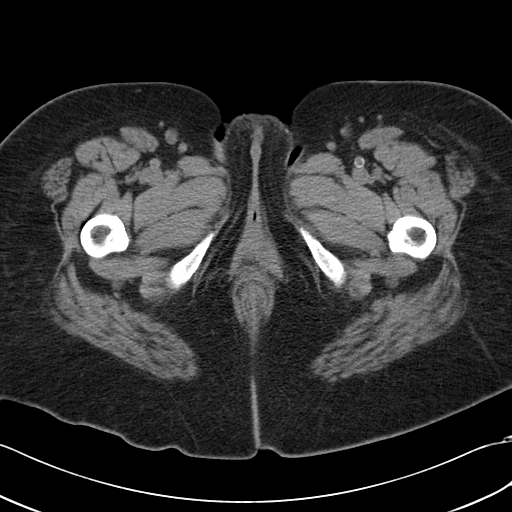
[im 4/87  bone]
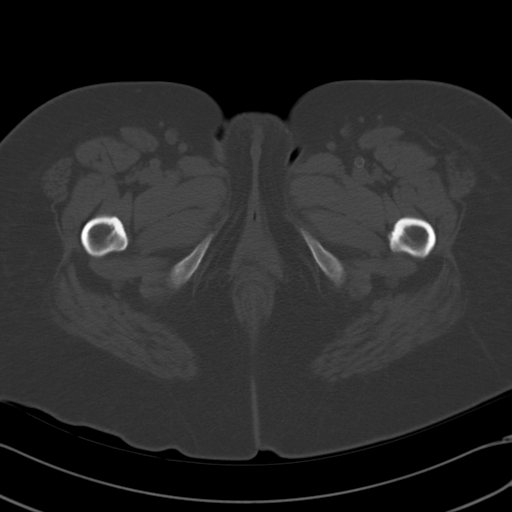
[im 11/87  soft-tissue]
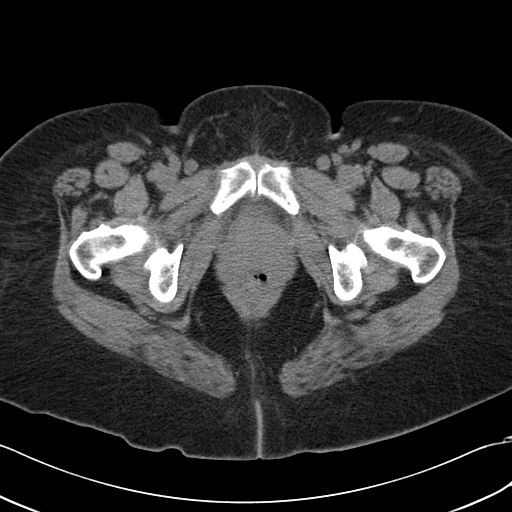
[im 18/87  soft-tissue]
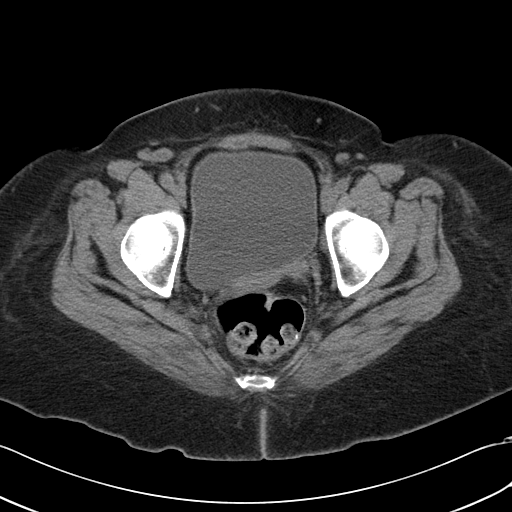
[im 22/87  soft-tissue]
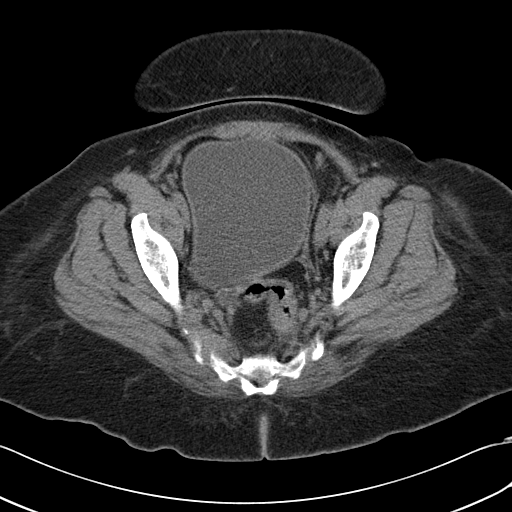
[im 29/87  soft-tissue]
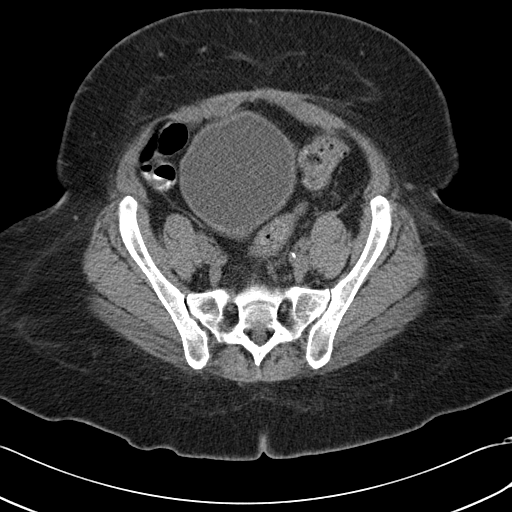
[im 36/87  soft-tissue]
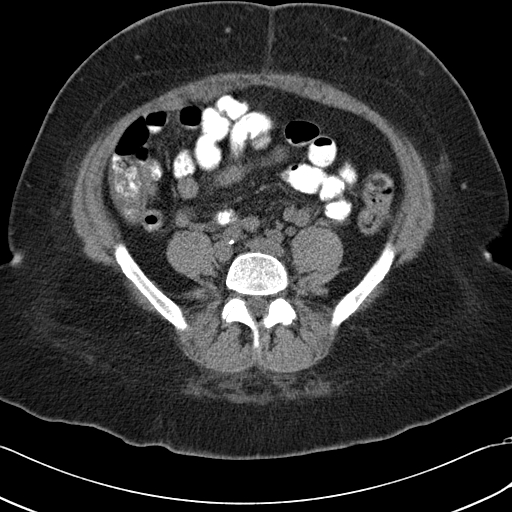
[im 40/87  soft-tissue]
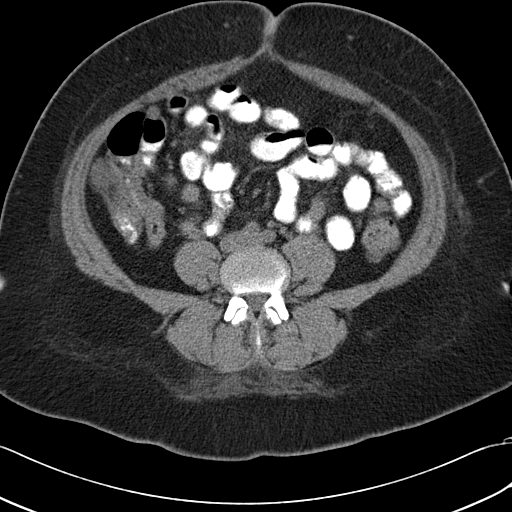
[im 47/87  soft-tissue]
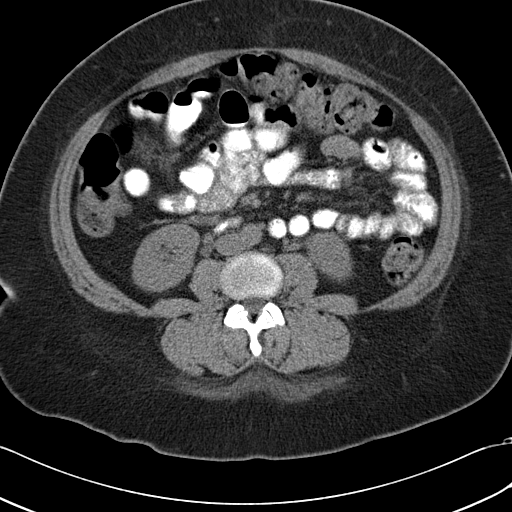
[im 51/87  soft-tissue]
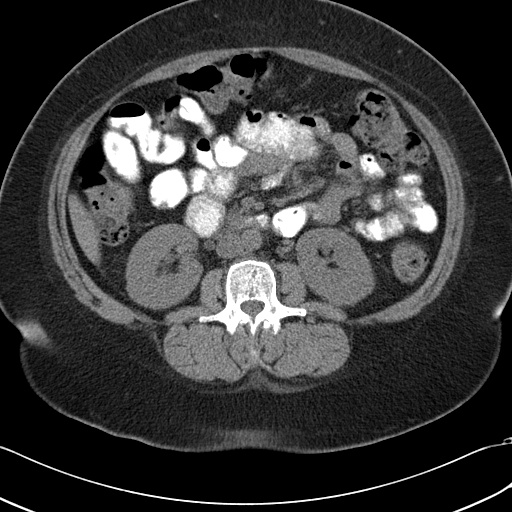
[im 51/87  bone]
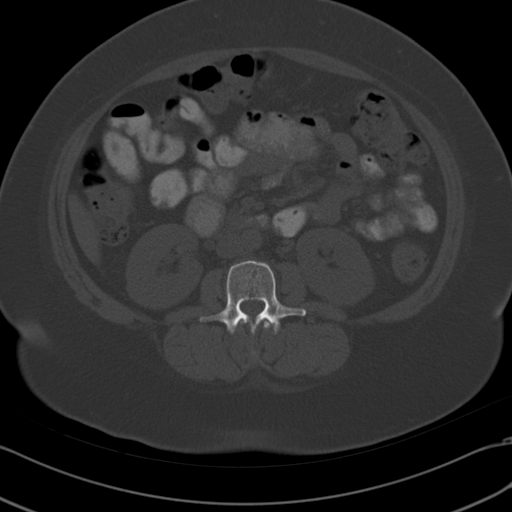
[im 58/87  soft-tissue]
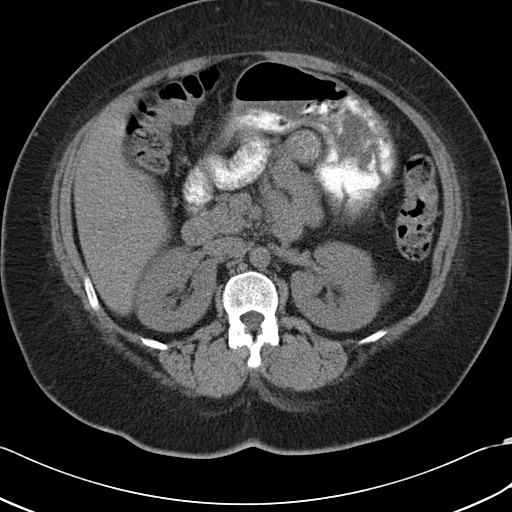
[im 65/87  soft-tissue]
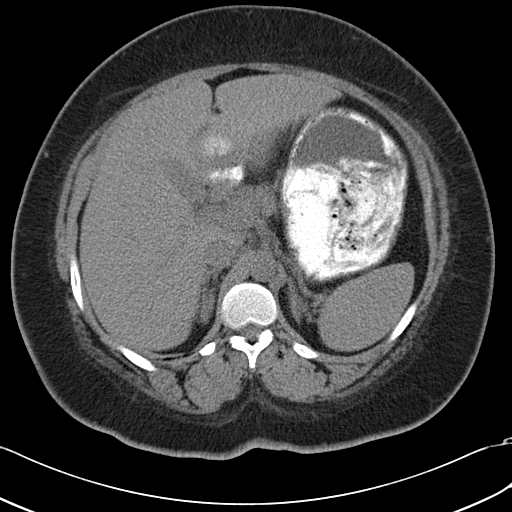
[im 69/87  soft-tissue]
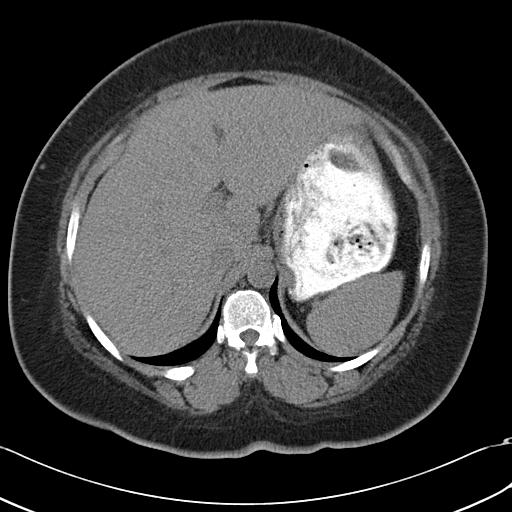
[im 76/87  soft-tissue]
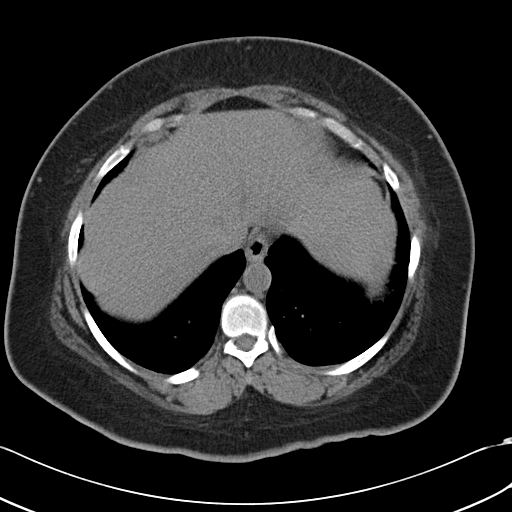
[im 83/87  soft-tissue]
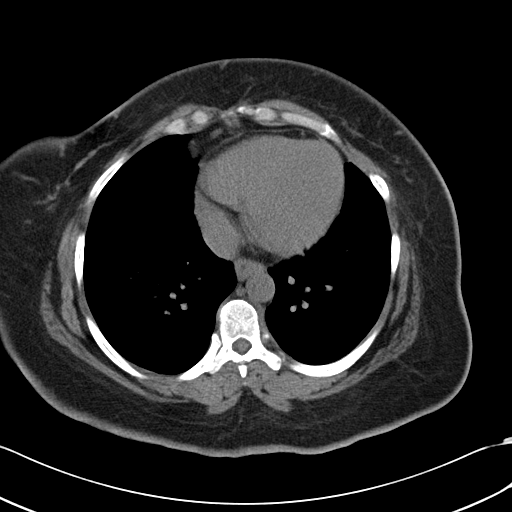

[Series 602: coronal images · coronal · 0.88mm/px · 3 of 82 slices shown]
[im 28/82  soft-tissue]
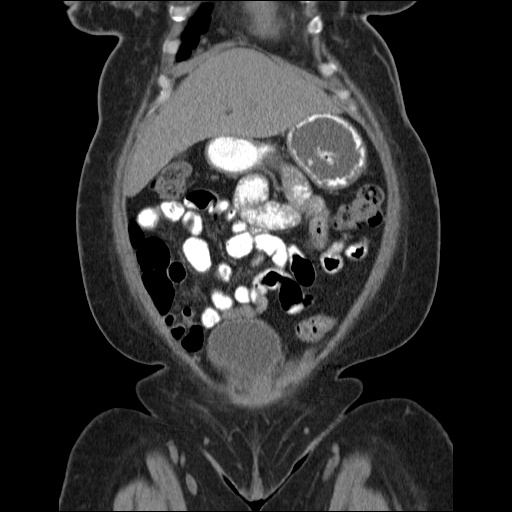
[im 37/82  soft-tissue]
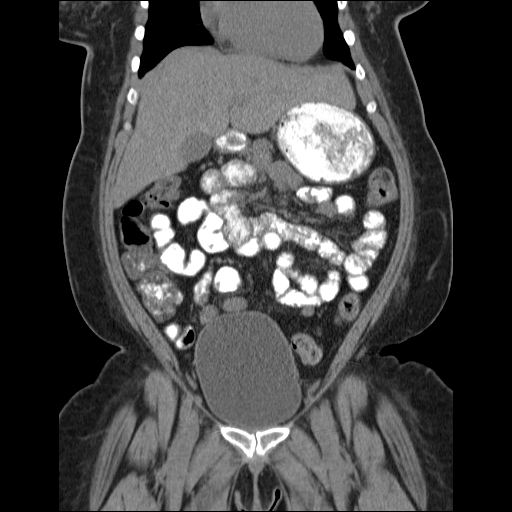
[im 46/82  soft-tissue]
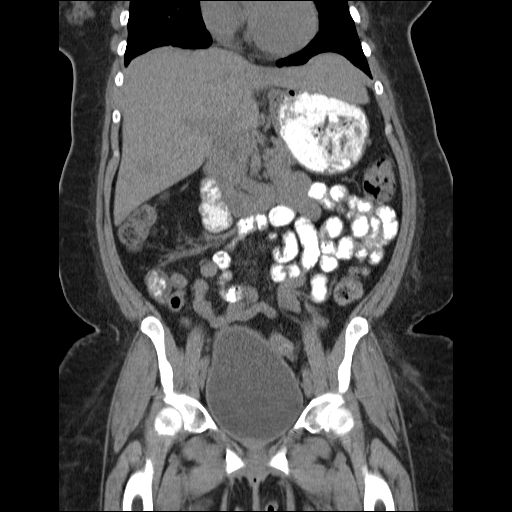

[17 of 46 positions shown; findings below may reference images not displayed]

FINDINGS: Low density lesion is seen in the inferior aspect of the right lobe
of the liver, likely stable in size. This has been shown by prior MRI to be
compatible with hemangioma. Solid organs otherwise an unremarkable unenhanced
appearance. There is a normal retrocecal appendix. Small bowel is nondilated. No
free fluid, free air, or adenopathy.

Lung bases are clear.
IMPRESSION: No acute findings in the abdomen on this uninfused scan.

PELVIS CT WITHOUT CONTRAST:
FINDINGS: Bowel grossly unremarkable. No free fluid, free air, or adenopathy.
Patient is status post hysterectomy. Urinary bladder unremarkable.
IMPRESSION: No acute findings in the pelvis.

## 2009-03-09 ENCOUNTER — Observation Stay (HOSPITAL_COMMUNITY): Admission: EM | Admit: 2009-03-09 | Discharge: 2009-03-09 | Payer: Self-pay | Admitting: Emergency Medicine

## 2009-03-22 ENCOUNTER — Inpatient Hospital Stay (HOSPITAL_COMMUNITY): Admission: EM | Admit: 2009-03-22 | Discharge: 2009-03-30 | Payer: Self-pay | Admitting: Emergency Medicine

## 2009-03-23 ENCOUNTER — Ambulatory Visit: Payer: Self-pay | Admitting: Vascular Surgery

## 2009-03-23 ENCOUNTER — Encounter (INDEPENDENT_AMBULATORY_CARE_PROVIDER_SITE_OTHER): Payer: Self-pay | Admitting: Internal Medicine

## 2009-03-24 ENCOUNTER — Encounter (INDEPENDENT_AMBULATORY_CARE_PROVIDER_SITE_OTHER): Payer: Self-pay | Admitting: Internal Medicine

## 2009-04-03 ENCOUNTER — Inpatient Hospital Stay (HOSPITAL_COMMUNITY): Admission: EM | Admit: 2009-04-03 | Discharge: 2009-04-05 | Payer: Self-pay | Admitting: Emergency Medicine

## 2009-04-25 IMAGING — CR DG CERVICAL SPINE COMPLETE 4+V
7 series · 7 of 7 positions shown · non-contrast
Comparison: None.

CLINICAL DATA: Upper extremity tingling.
 CERVICAL SPINE ? 6 VIEW:

[w c-spine lat *]
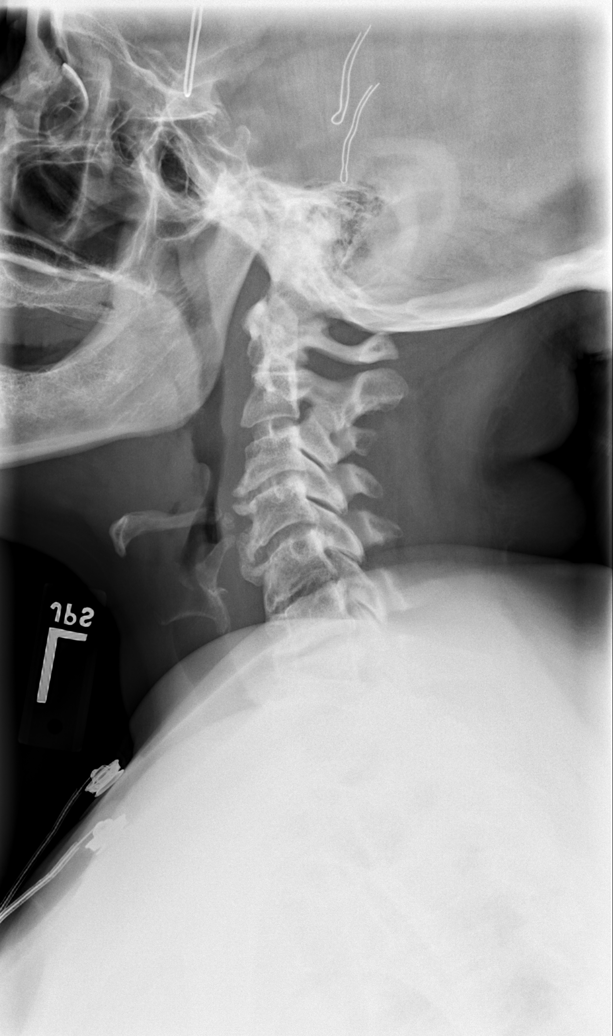

[w c-spine oblique (1 of 2)]
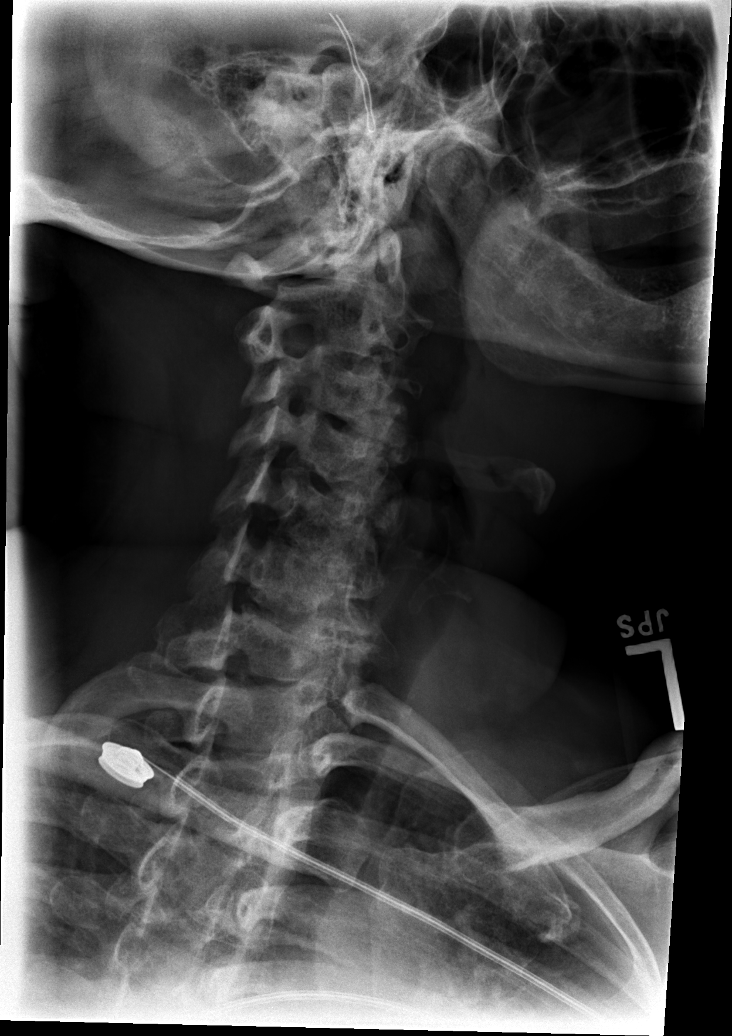

[w c-spine oblique (2 of 2)]
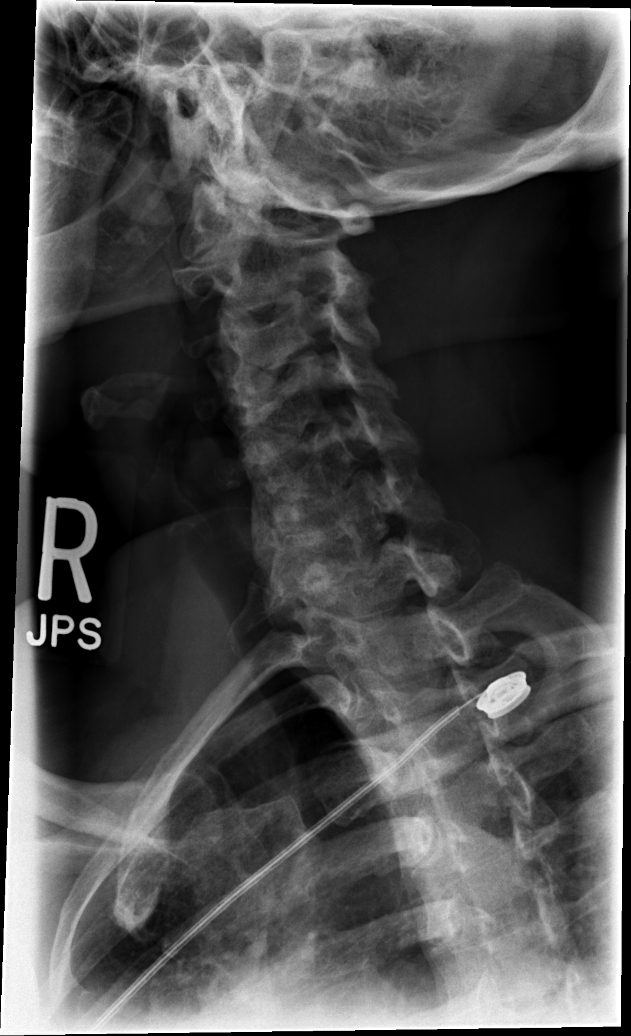

[w c-spine a.p.]
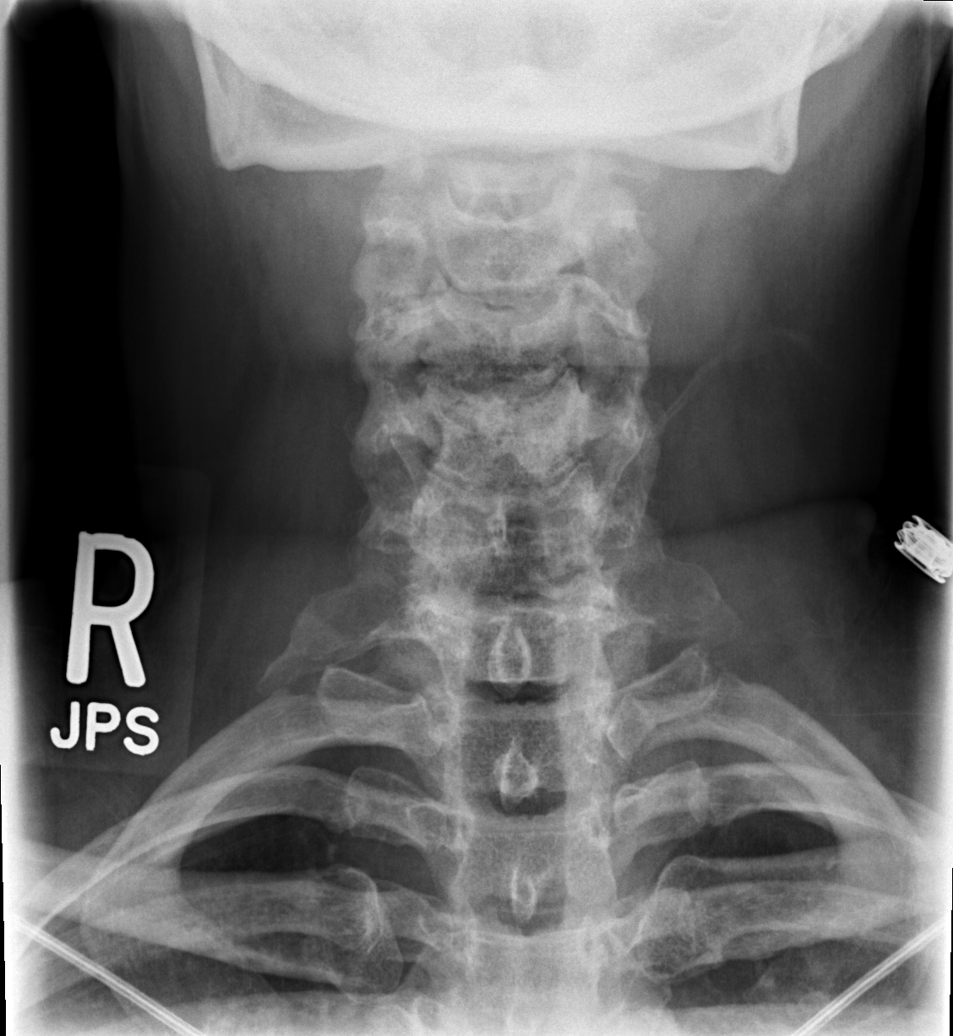

[w c-spine odontoid (1 of 2)]
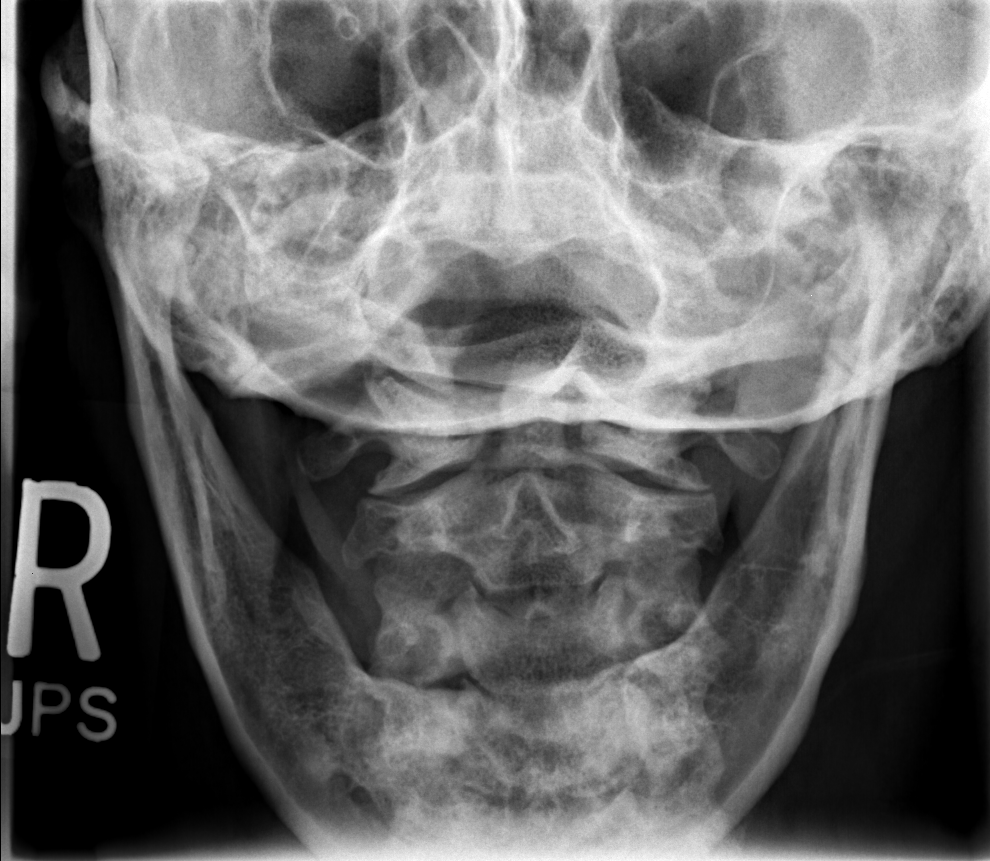

[w c-spine odontoid (2 of 2)]
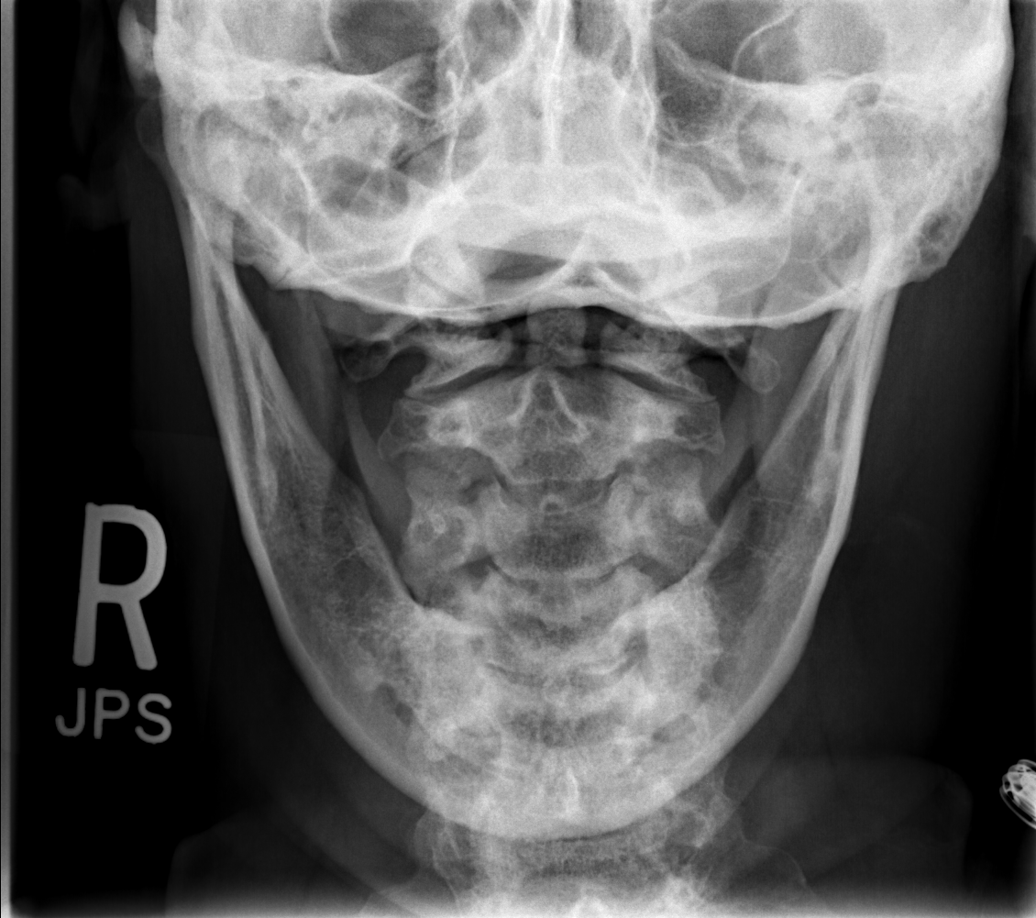

[w swimmers view]
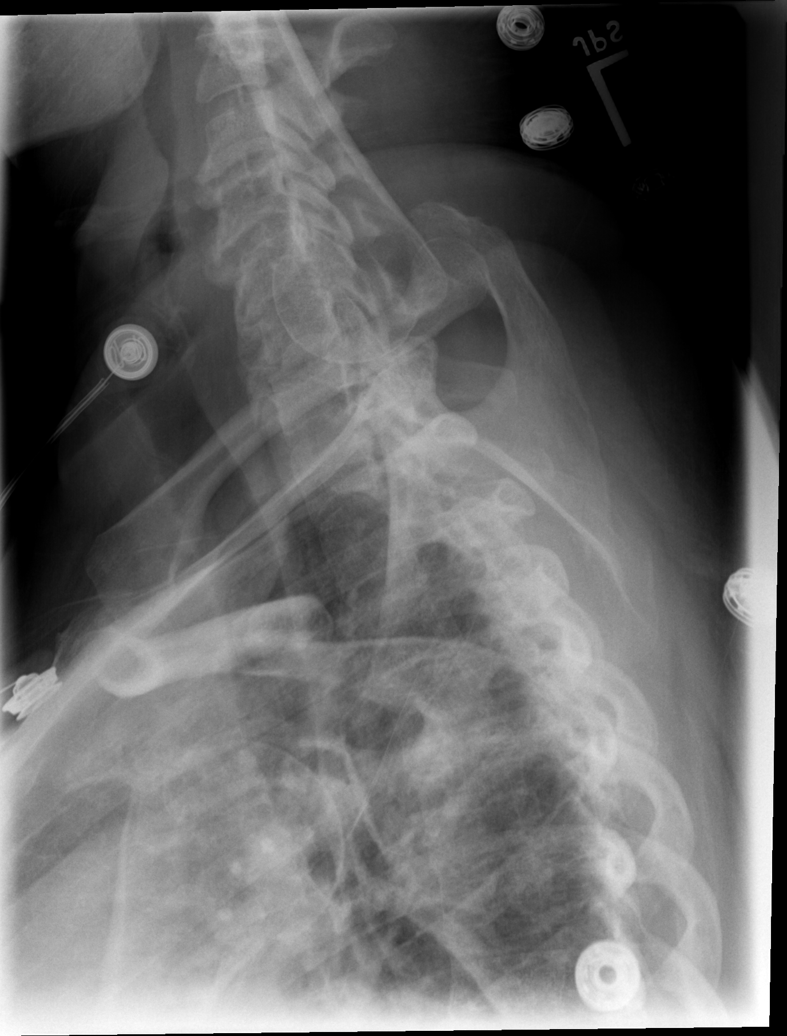

[7 of 7 positions shown; findings below may reference images not displayed]

FINDINGS: Vertebral body height and alignment are maintained.  Prevertebral soft tissues appear normal.  The patient has multilevel degenerative disc disease extending from C3-4 to C7-T1.
IMPRESSION: No acute finding with multilevel degenerative change noted.

## 2009-06-25 IMAGING — CR DG ABDOMEN ACUTE W/ 1V CHEST
3 series · 3 of 3 positions shown · non-contrast
Comparison: none

CLINICAL DATA: Chest and abdominal pain with vomiting.  
 ACUTE ABDOMINAL SERIES WITH CHEST - 3 VIEW:

[w chest pa]
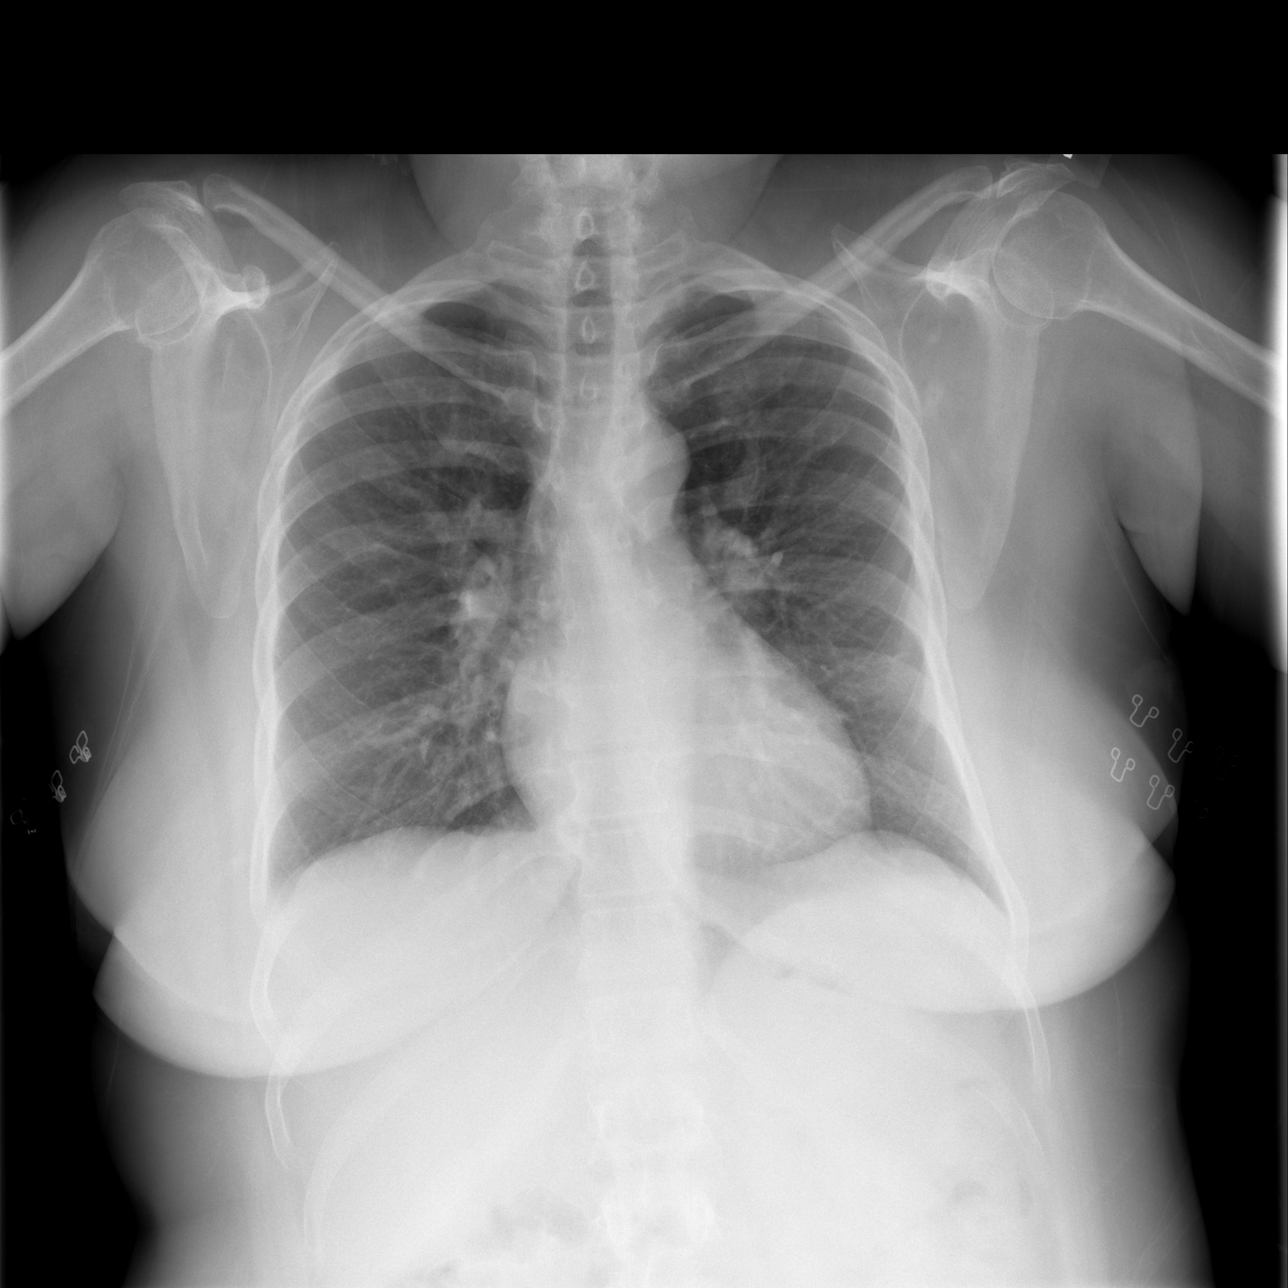

[w abdomen upright]
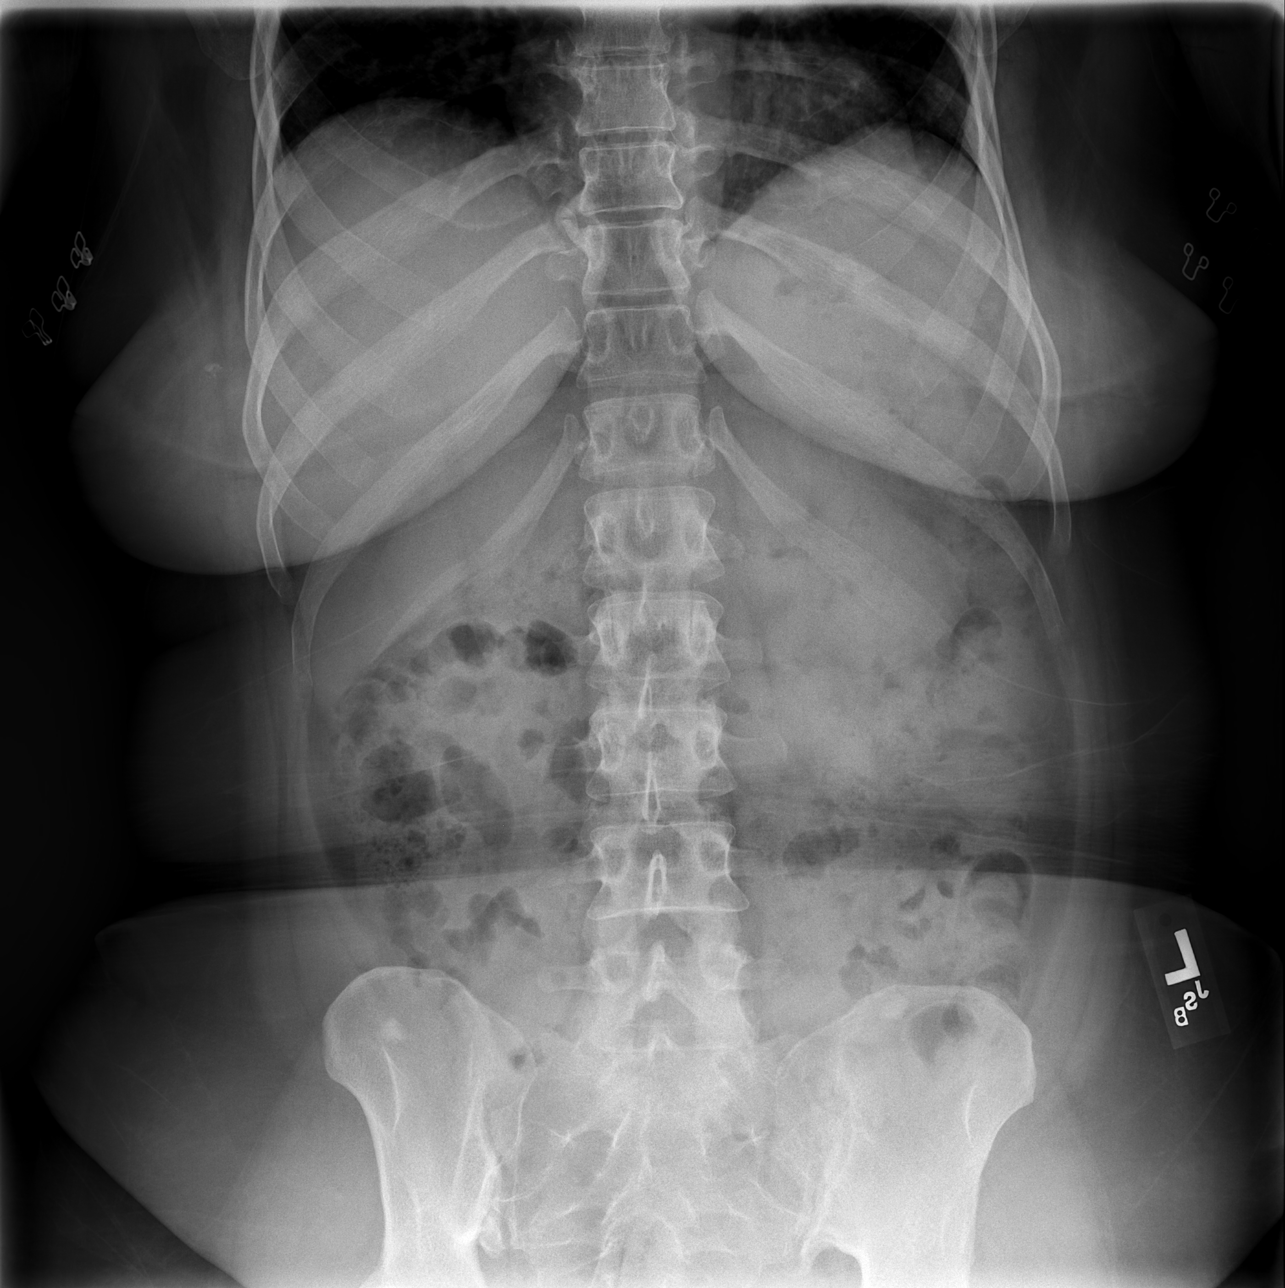

[t abdomen supine]
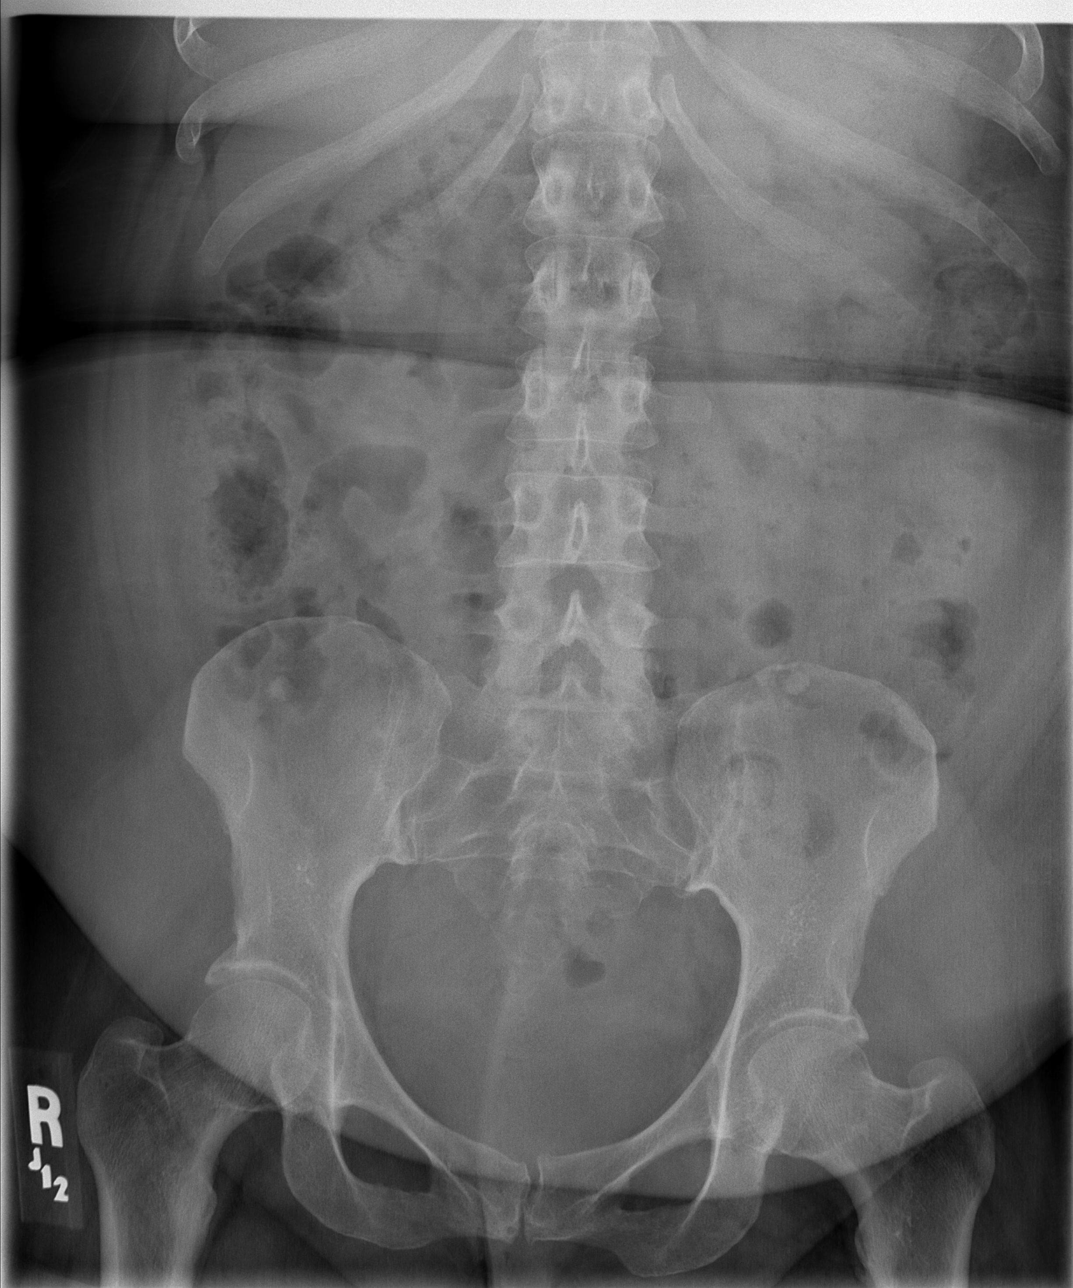

[3 of 3 positions shown; findings below may reference images not displayed]

FINDINGS: Trachea is midline.  Heart size normal.  Lungs are clear.  No pleural fluid 
 Two views of the abdomen show gas and stool in the colon with minimal small bowel prominence.  Two rounded calcifications are seen in the lower quadrants and may be external to the patient given their symmetry.
IMPRESSION: Nonspecific bowel gas pattern.

## 2009-08-02 ENCOUNTER — Emergency Department (HOSPITAL_COMMUNITY): Admission: EM | Admit: 2009-08-02 | Discharge: 2009-08-03 | Payer: Self-pay | Admitting: Emergency Medicine

## 2009-08-21 ENCOUNTER — Emergency Department (HOSPITAL_COMMUNITY): Admission: EM | Admit: 2009-08-21 | Discharge: 2009-08-21 | Payer: Self-pay | Admitting: Emergency Medicine

## 2009-08-24 ENCOUNTER — Emergency Department (HOSPITAL_COMMUNITY): Admission: EM | Admit: 2009-08-24 | Discharge: 2009-08-25 | Payer: Self-pay | Admitting: Emergency Medicine

## 2009-10-06 ENCOUNTER — Emergency Department (HOSPITAL_COMMUNITY)
Admission: EM | Admit: 2009-10-06 | Discharge: 2009-10-07 | Payer: Self-pay | Source: Home / Self Care | Admitting: Emergency Medicine

## 2009-10-14 ENCOUNTER — Emergency Department (HOSPITAL_COMMUNITY): Admission: EM | Admit: 2009-10-14 | Discharge: 2009-10-14 | Payer: Self-pay | Admitting: Emergency Medicine

## 2009-12-08 ENCOUNTER — Other Ambulatory Visit: Admission: RE | Admit: 2009-12-08 | Discharge: 2009-12-08 | Payer: Self-pay | Admitting: Family Medicine

## 2009-12-16 ENCOUNTER — Encounter: Admission: RE | Admit: 2009-12-16 | Discharge: 2010-01-25 | Payer: Self-pay | Admitting: Orthopedic Surgery

## 2009-12-27 ENCOUNTER — Observation Stay (HOSPITAL_COMMUNITY): Admission: EM | Admit: 2009-12-27 | Discharge: 2009-12-27 | Payer: Self-pay | Admitting: Emergency Medicine

## 2010-01-09 IMAGING — CR DG ABDOMEN 2V
2 series · 2 of 2 positions shown · non-contrast
Comparison: 12/05/2007 CT scan

CLINICAL DATA: Follow-up metal seen on CT scan

ABDOMEN - 2 VIEW

[w abdomen upright]
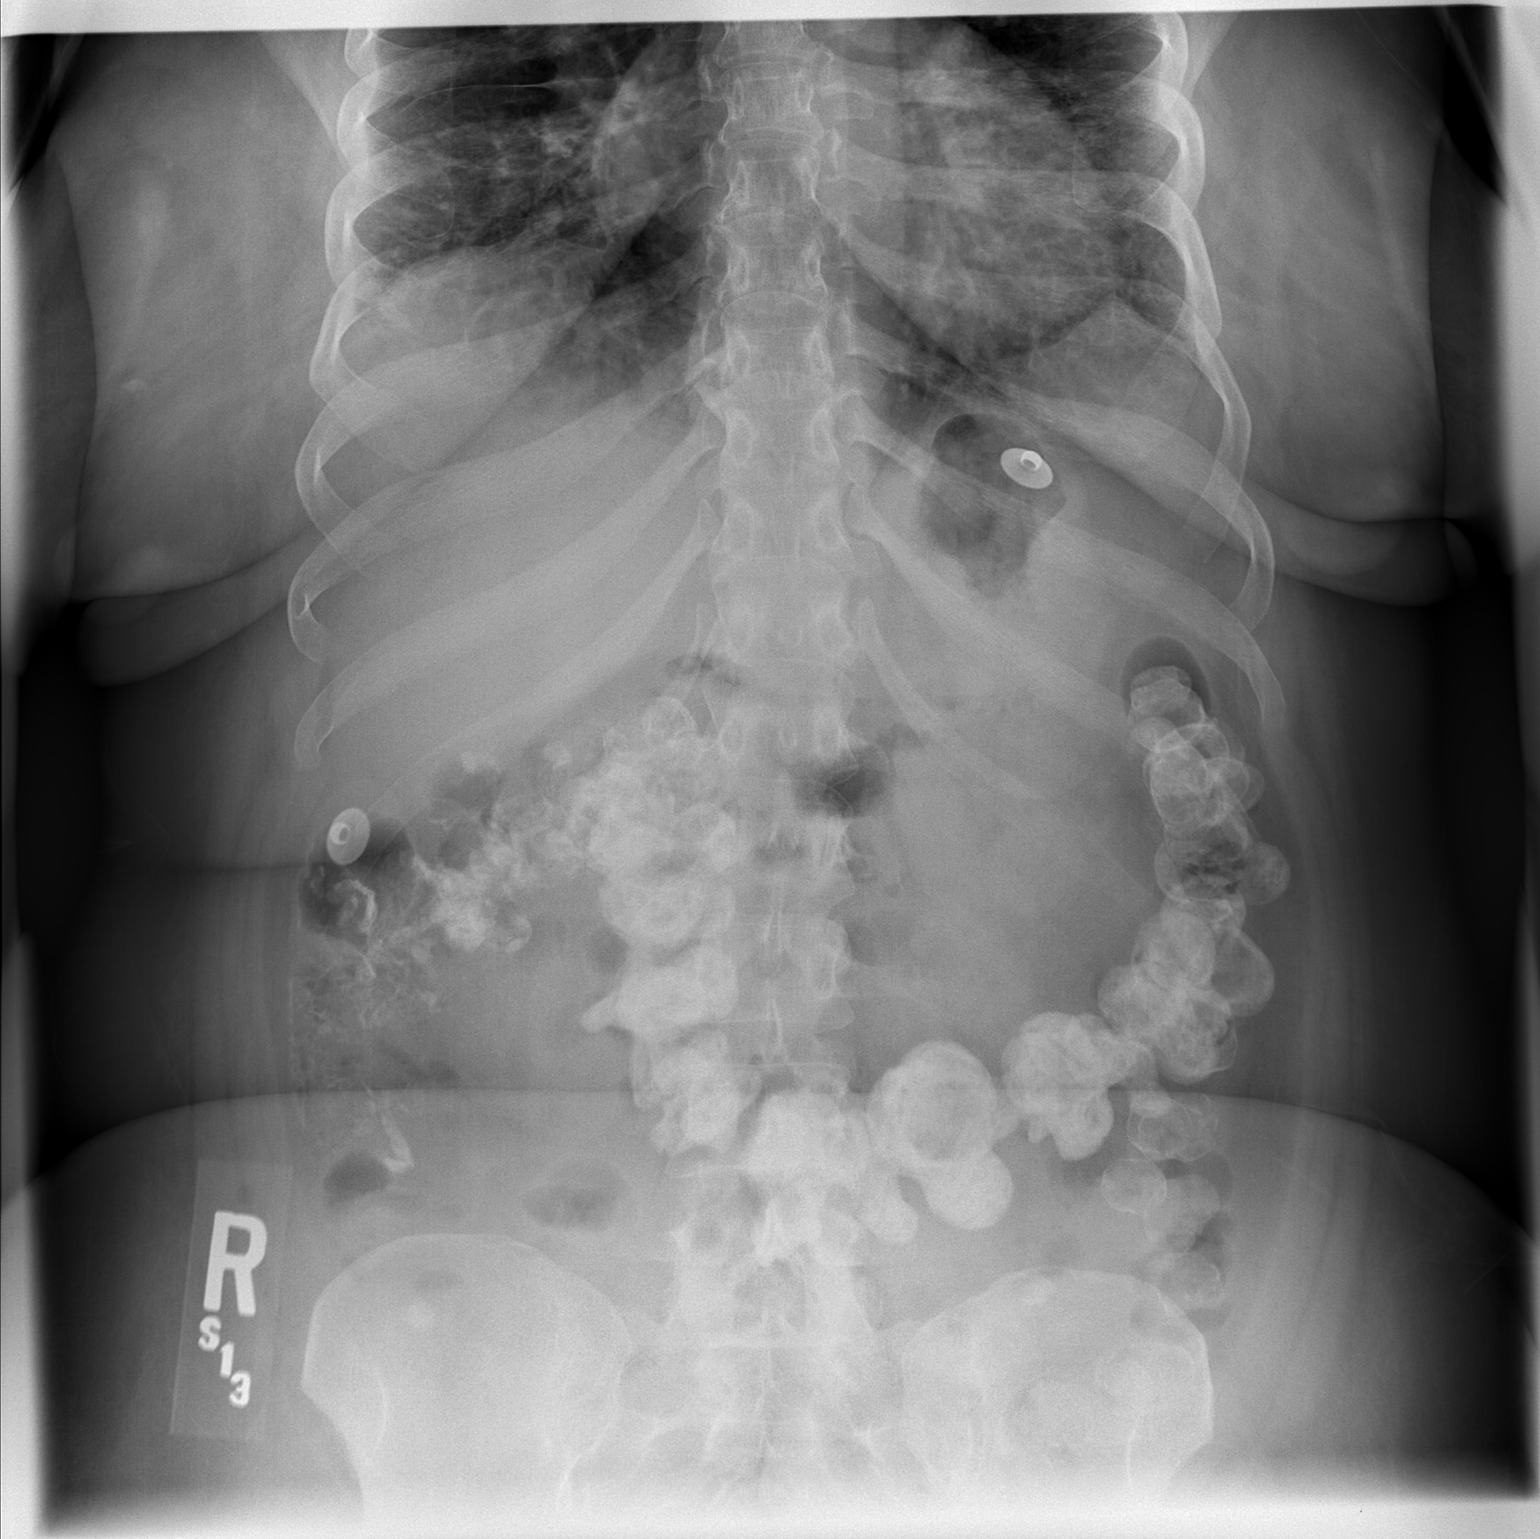

[t abdomen supine]
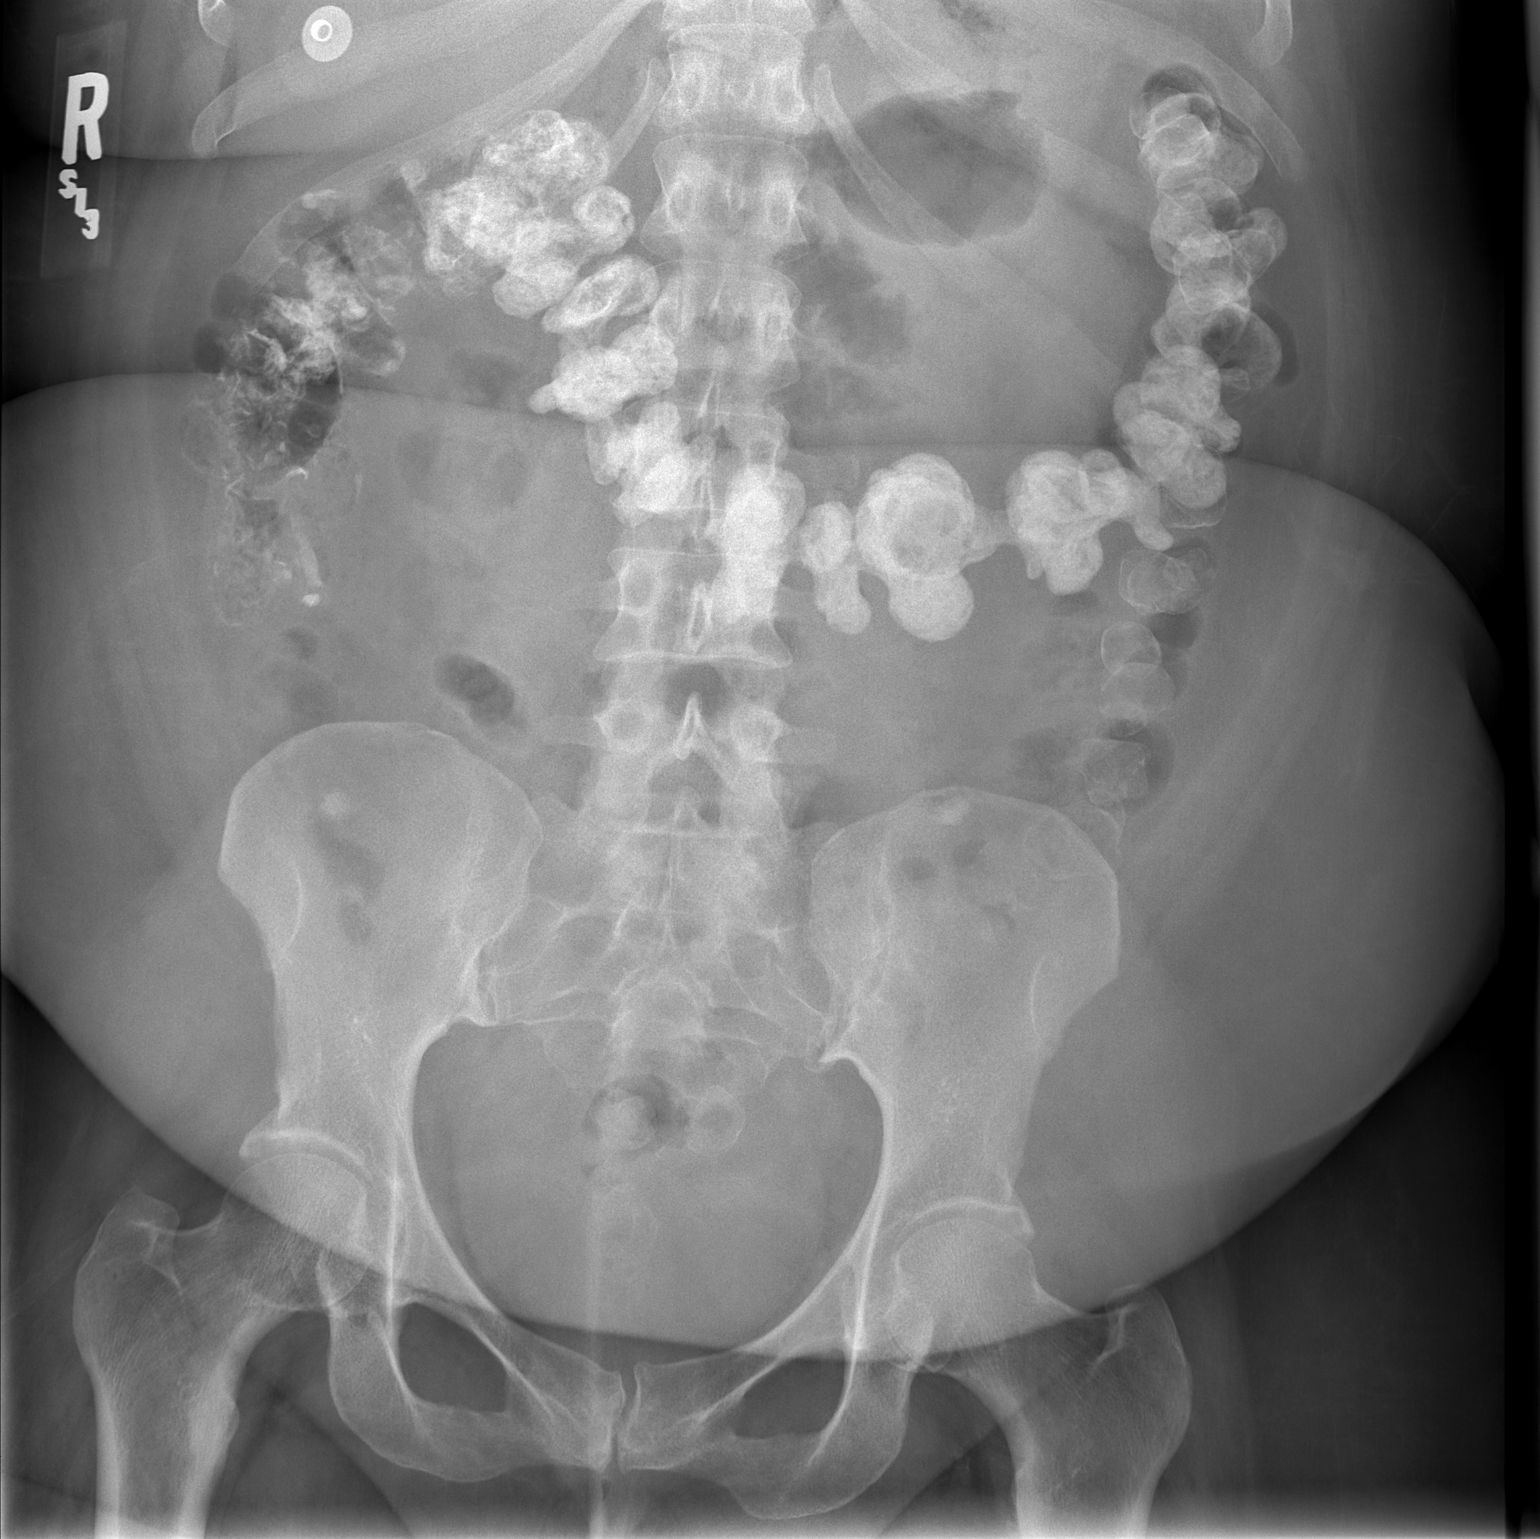

[2 of 2 positions shown; findings below may reference images not displayed]

FINDINGS: There is a nonspecific nonobstructive bowel gas pattern.
Residual contrast material noted throughout the colon limiting the
examination.  No metallic foreign bodies noted.
IMPRESSION: Nonspecific nonobstructive bowel gas pattern.  Residual contrast
material from recent CT scan noted within colon.  No metallic
foreign body is identified.

## 2010-02-04 ENCOUNTER — Emergency Department (HOSPITAL_COMMUNITY): Admission: EM | Admit: 2010-02-04 | Discharge: 2010-02-04 | Payer: Self-pay | Admitting: Emergency Medicine

## 2010-02-28 ENCOUNTER — Emergency Department (HOSPITAL_COMMUNITY): Admission: EM | Admit: 2010-02-28 | Discharge: 2010-02-28 | Payer: Self-pay | Admitting: Emergency Medicine

## 2010-03-23 IMAGING — CT CT ABDOMEN W/O CM
2 of 4 series · 14 of 32 positions shown, 19 images · non-contrast
Comparison: 12/05/2007

CT ABDOMEN

CLINICAL DATA: 1-week history of right-sided abdominal pain.

CT ABDOMEN AND PELVIS WITHOUT CONTRAST
TECHNIQUE: Multidetector CT imaging of the abdomen and pelvis was
performed followig the standard protocol without intravenous
contrast.

[Series 2: routine abdomen · axial · 0.87mm/px · z∈[-382,-82]mm · 6 of 86 slices shown, 11 images]
[im 13/86  soft-tissue]
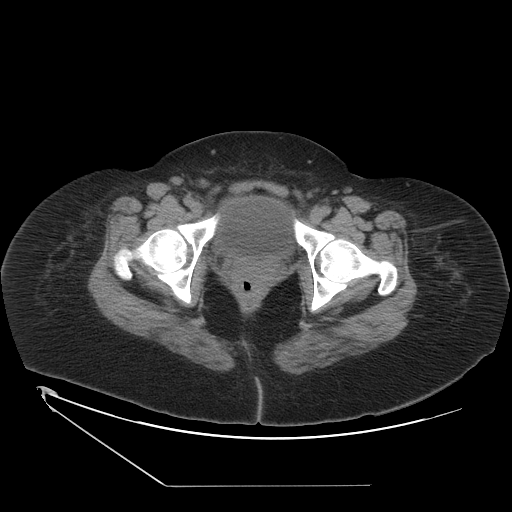
[im 13/86  bone]
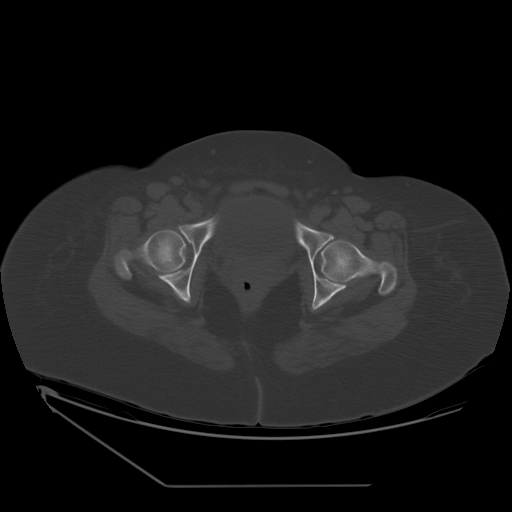
[im 25/86  soft-tissue]
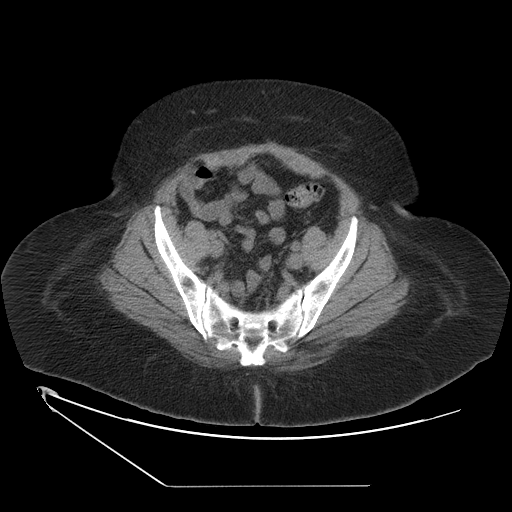
[im 37/86  soft-tissue]
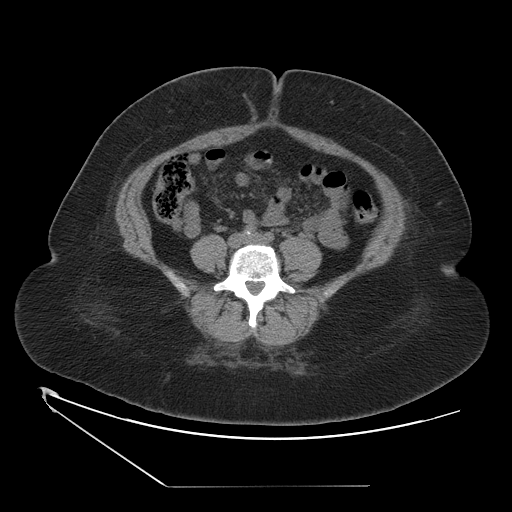
[im 37/86  lung]
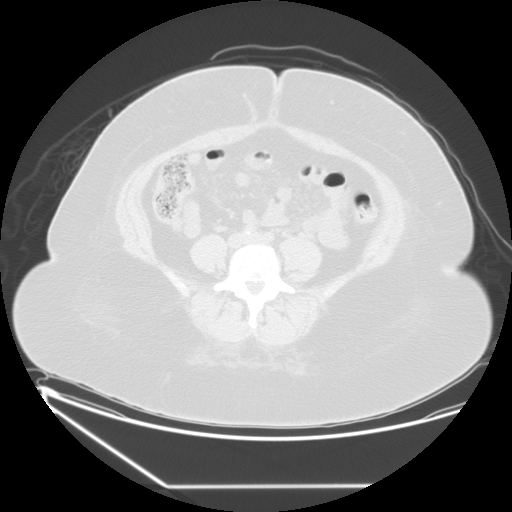
[im 49/86  soft-tissue]
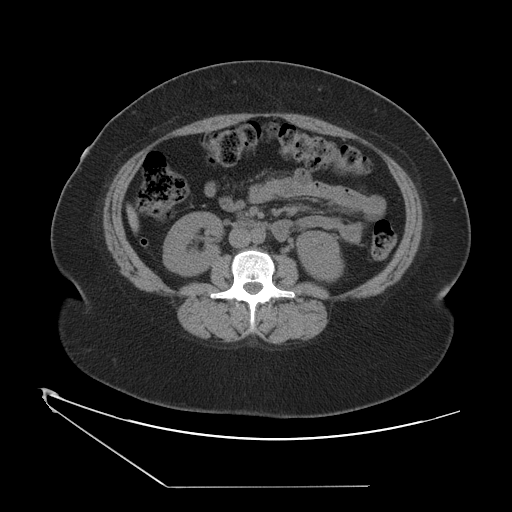
[im 49/86  lung]
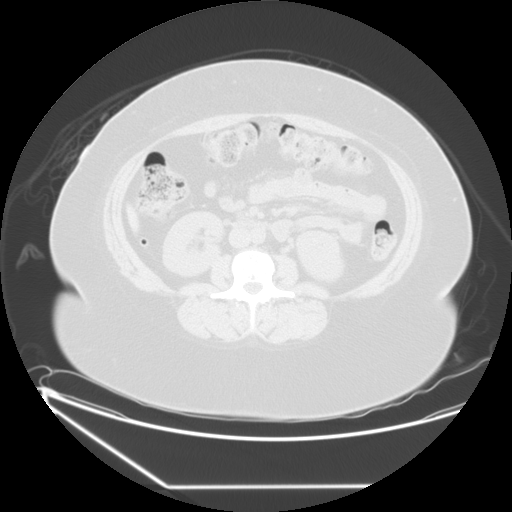
[im 61/86  soft-tissue]
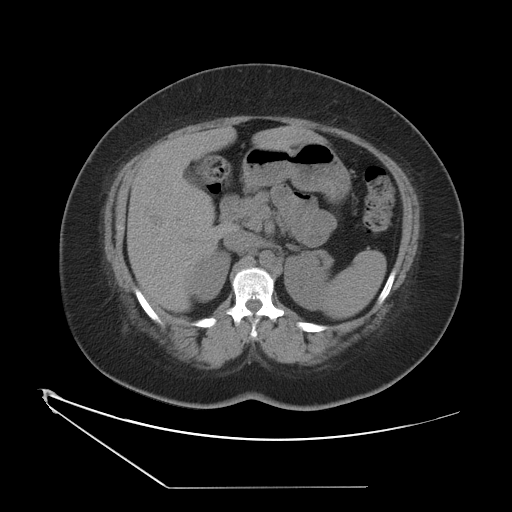
[im 61/86  lung]
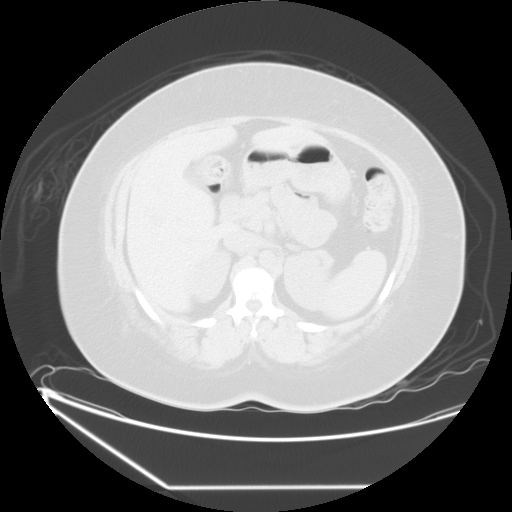
[im 73/86  soft-tissue]
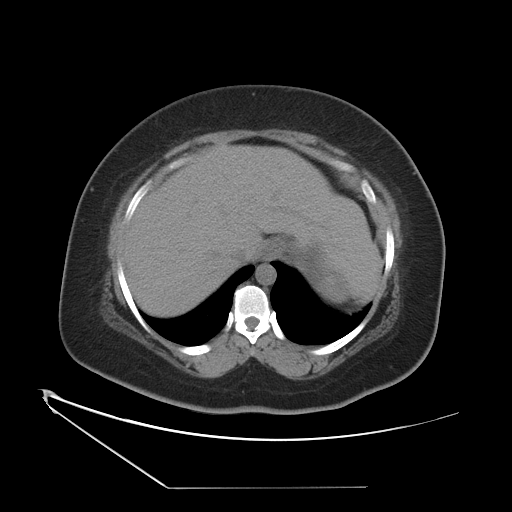
[im 73/86  lung]
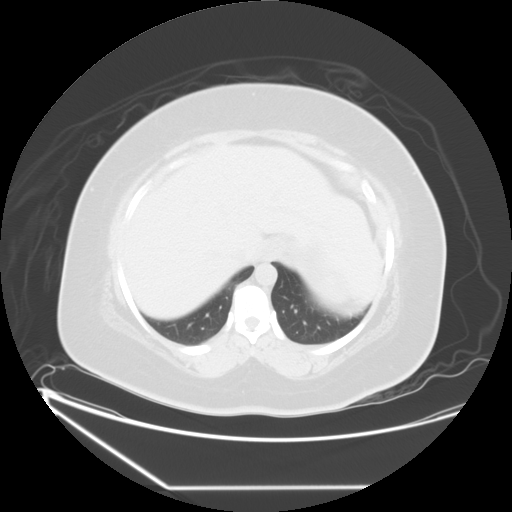

[Series 400: reformatted · sagittal · 0.87mm/px · 8 of 115 slices shown]
[im 11/115  soft-tissue]
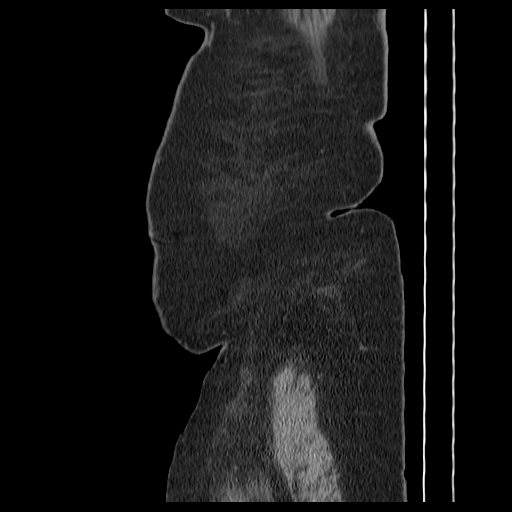
[im 21/115  soft-tissue]
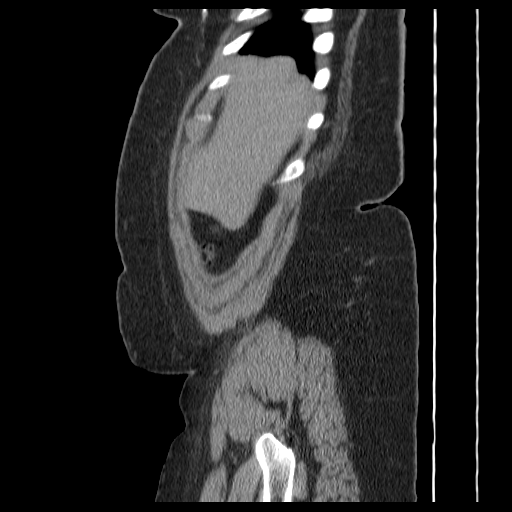
[im 42/115  soft-tissue]
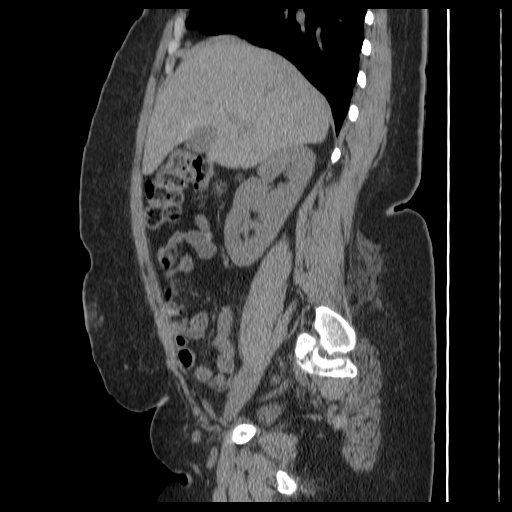
[im 52/115  soft-tissue]
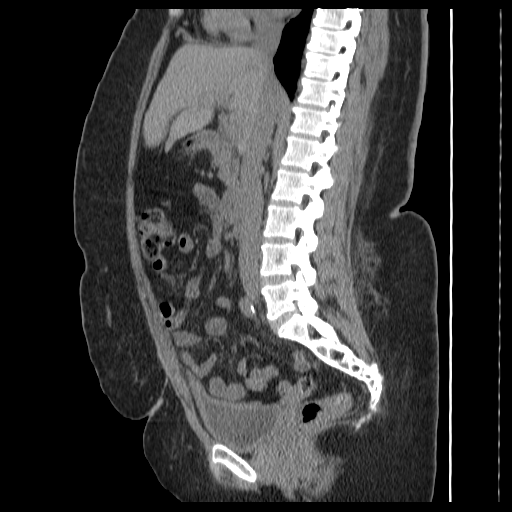
[im 63/115  soft-tissue]
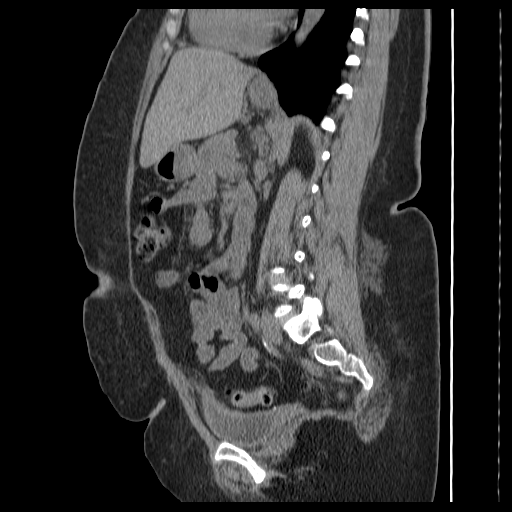
[im 73/115  soft-tissue]
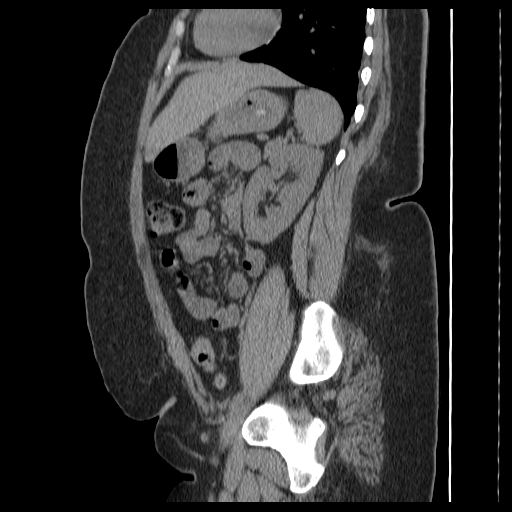
[im 94/115  soft-tissue]
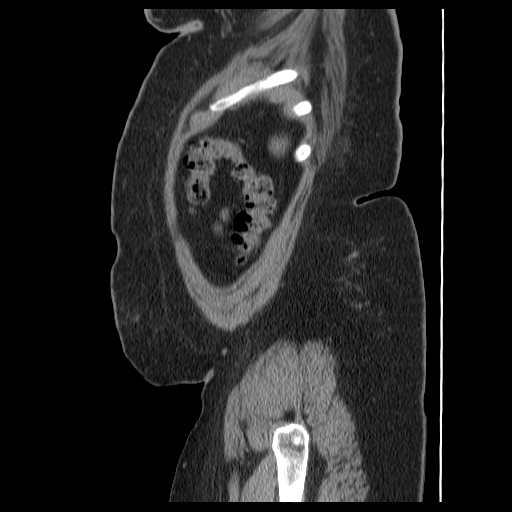
[im 104/115  soft-tissue]
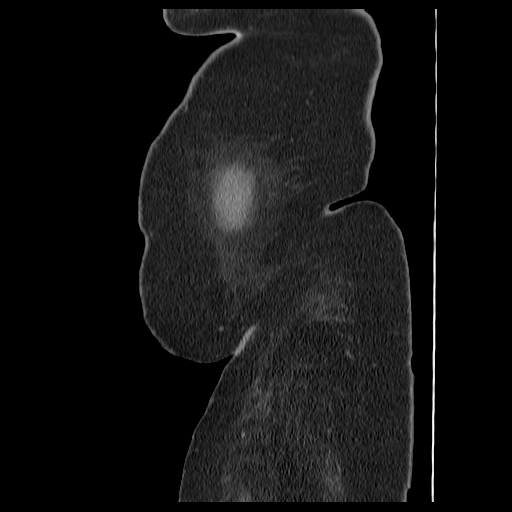

[14 of 32 positions shown; findings below may reference images not displayed]

FINDINGS: Again noted is a small low-density lesion in the
inferior aspect of the right lobe of the liver consistent with a
hemangioma, previously demonstrated on multiple prior exams and
essentially unchanged.  The remainder of the liver is normal.
Pancreas, spleen, adrenal glands, and kidneys are normal.  Terminal
ileum and appendix are normal.  No bony abnormality.  No dilated
bowel.
IMPRESSION: Benign-appearing abdomen.

CT PELVIS
FINDINGS: The uterus and ovaries appear to have been removed.
There is no free fluid, diverticular disease, or other significant
abnormality.  Benign bone island is seen in the posterior superior
iliac crest on the left.
IMPRESSION: Benign-appearing pelvis.  The patient has had 4 essentially
negative CT scans of the abdomen and pelvis since June 2006 for
similar symptoms.

## 2010-03-24 IMAGING — CR DG CHEST 2V
2 series · 2 of 2 positions shown · non-contrast
Comparison: PA and lateral chest 12/31/2006.

CLINICAL DATA: Chest pain shortness of breath.

CHEST - 2 VIEW

[w chest pa]
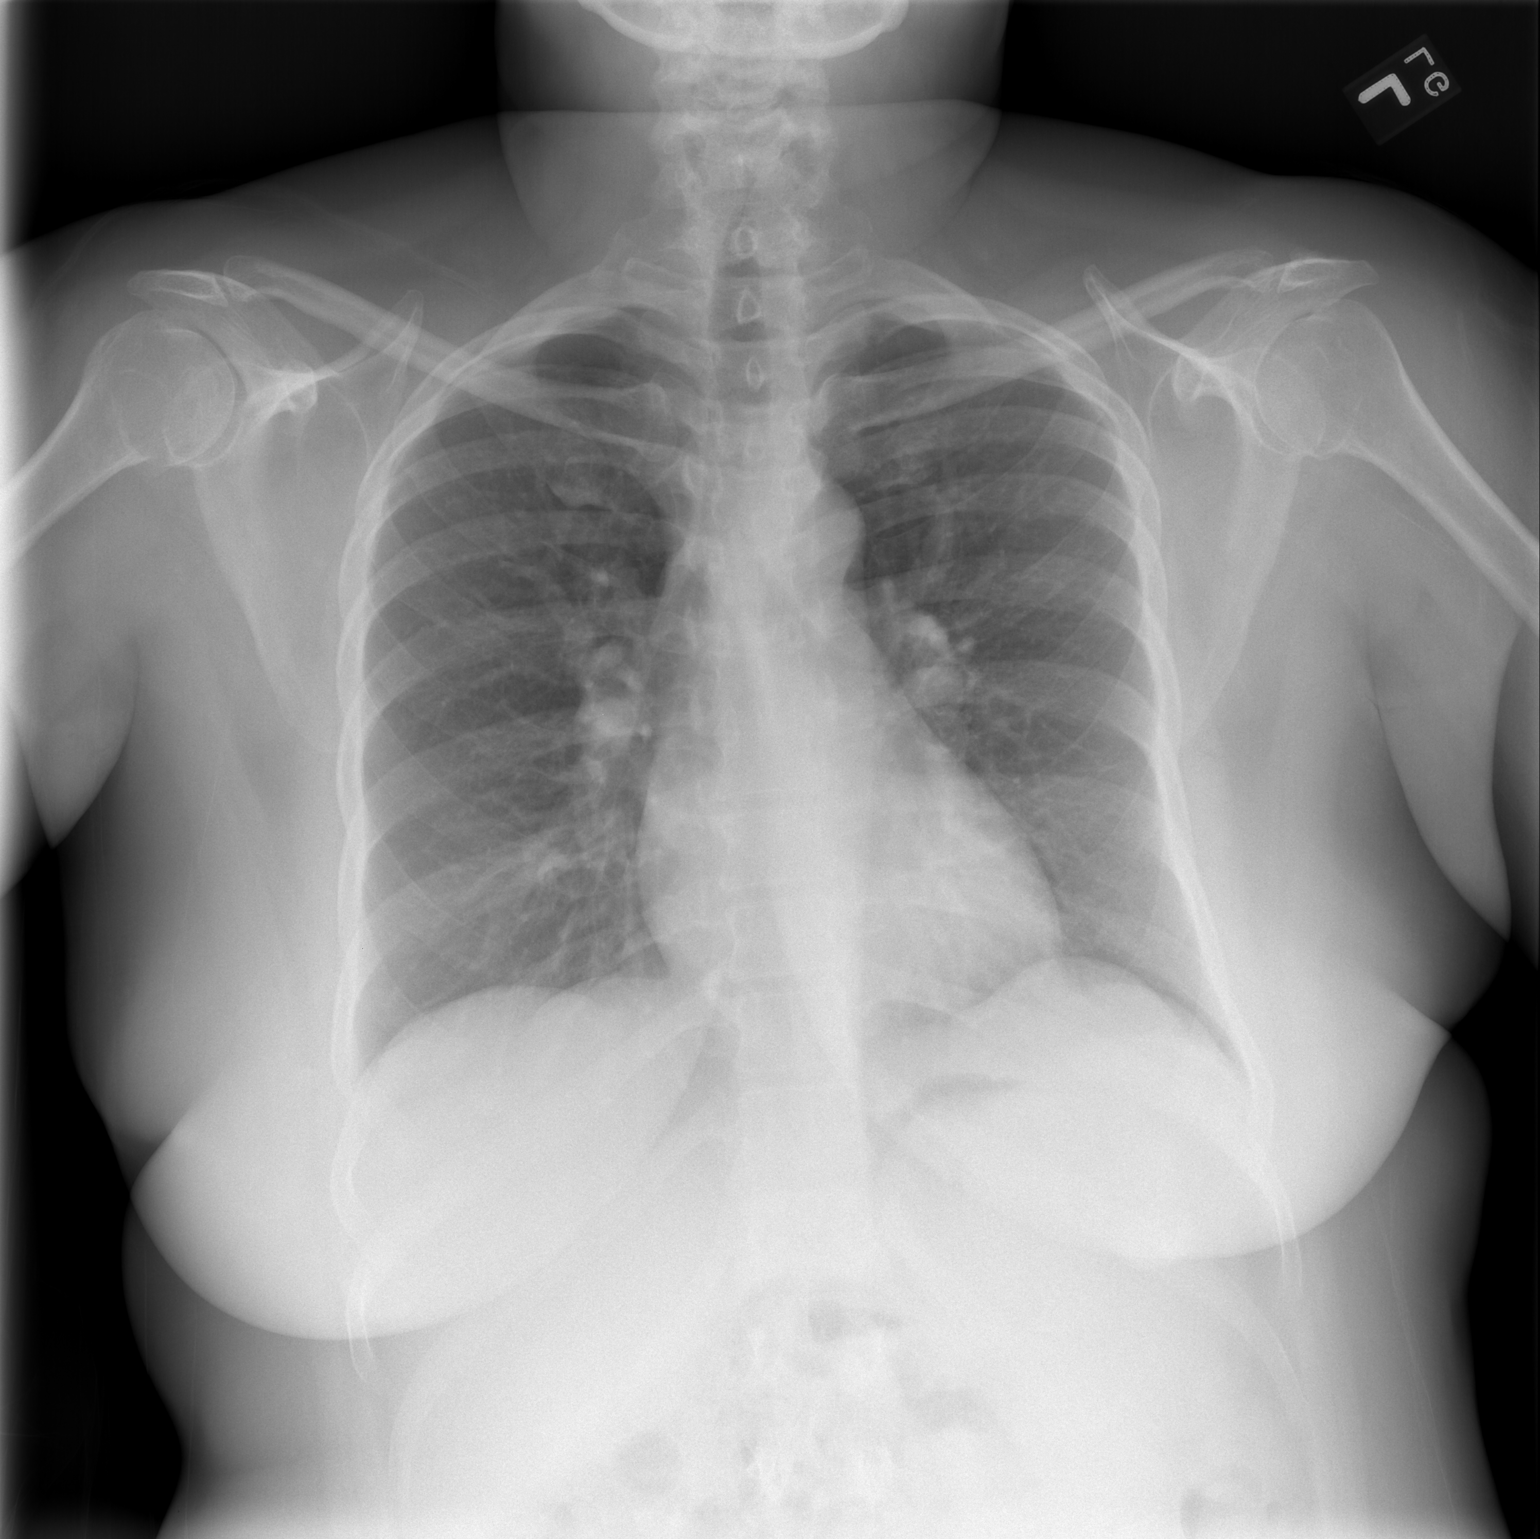

[w chest lat]
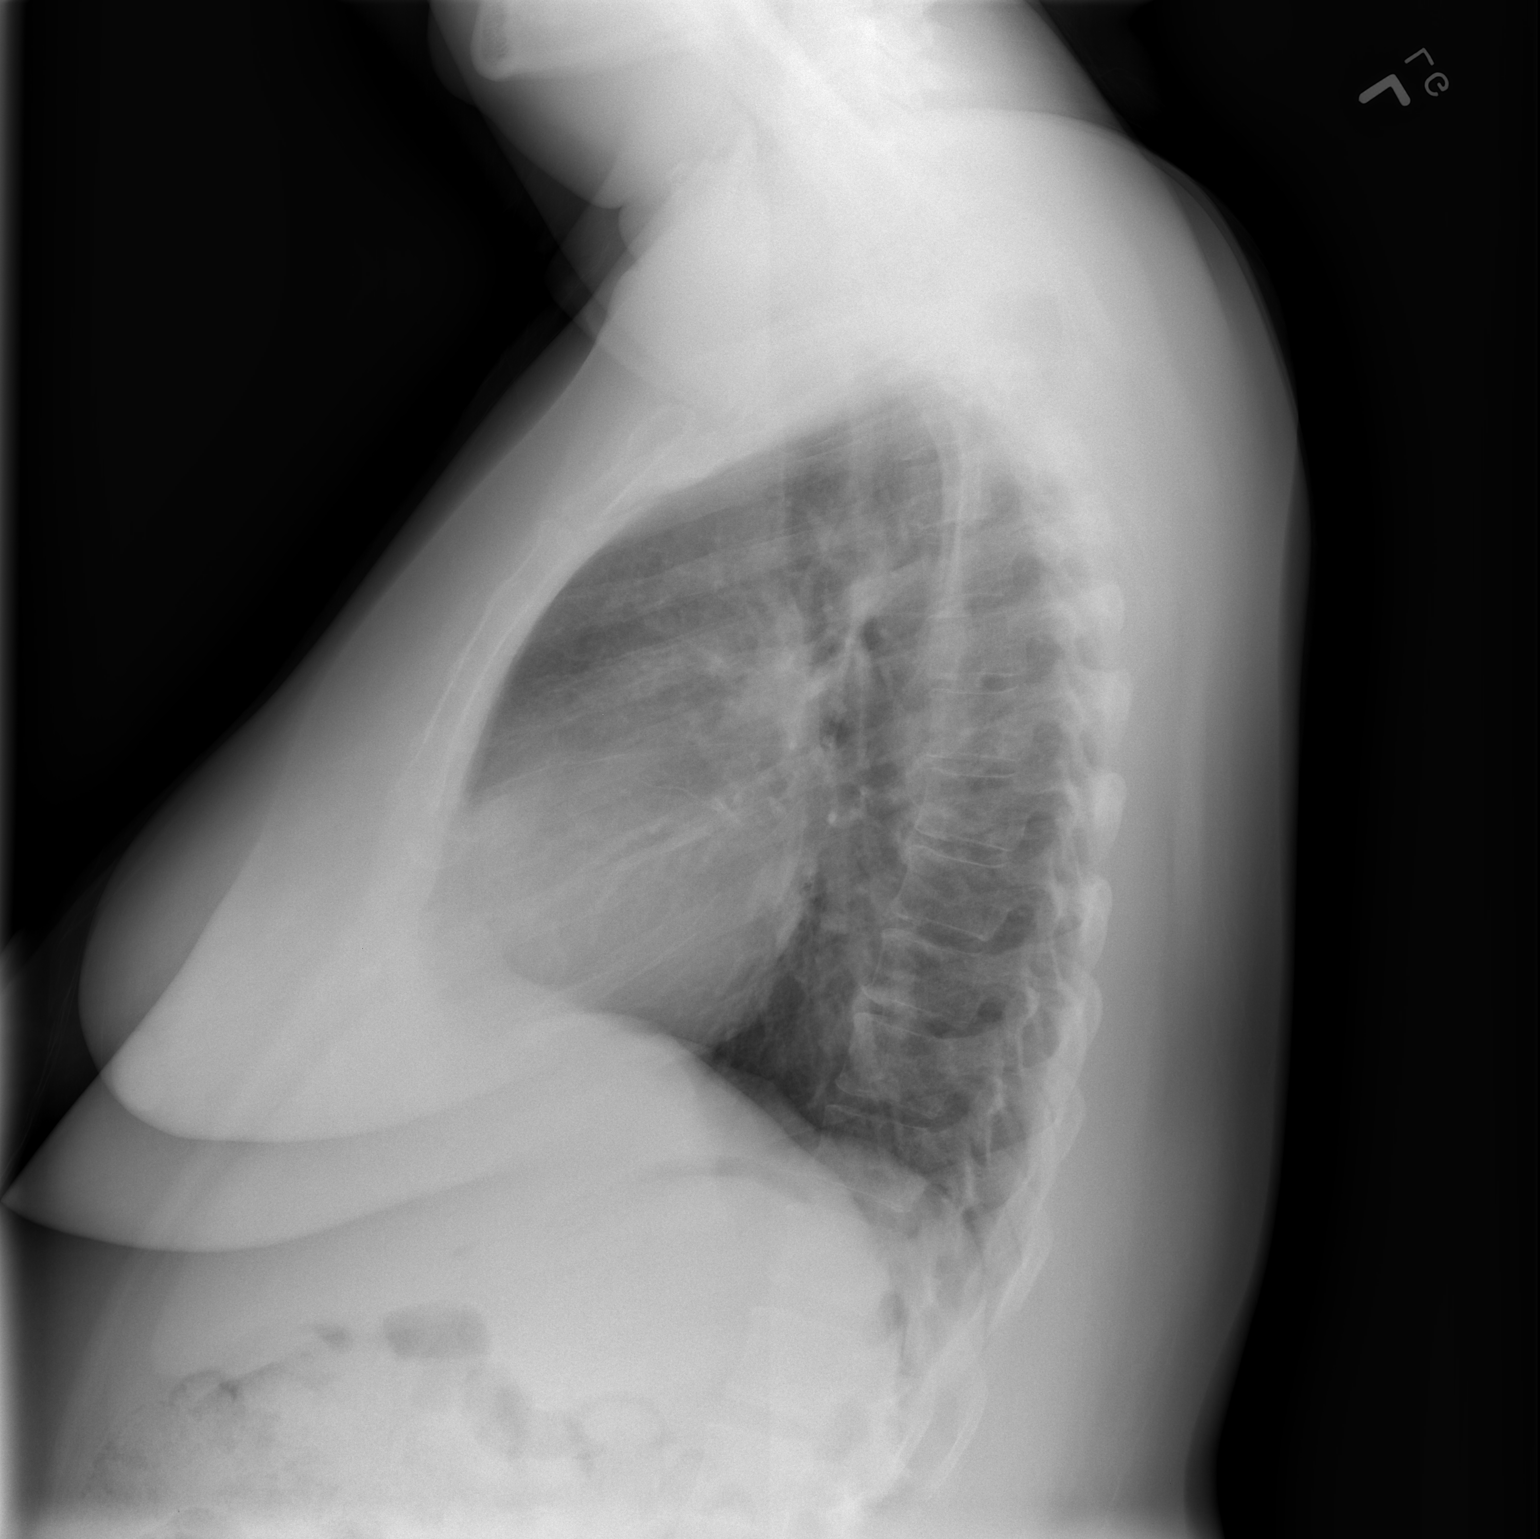

[2 of 2 positions shown; findings below may reference images not displayed]

FINDINGS: The lungs are clear.  Heart size is normal.  There is no
pleural effusion or focal bony abnormality.
IMPRESSION: No acute disease.

## 2010-05-12 IMAGING — MG MM SCREEN MAMMOGRAM BILATERAL
4 series · 4 of 4 positions shown · non-contrast
Comparison: none

DG SCREEN MAMMOGRAM BILATERAL
Bilateral CC and MLO view(s) were taken.

DIGITAL SCREENING MAMMOGRAM WITH CAD:
The breast tissue is heterogeneously dense.  No masses or malignant type calcifications are 
identified.  Compared with prior studies.

[R CC]
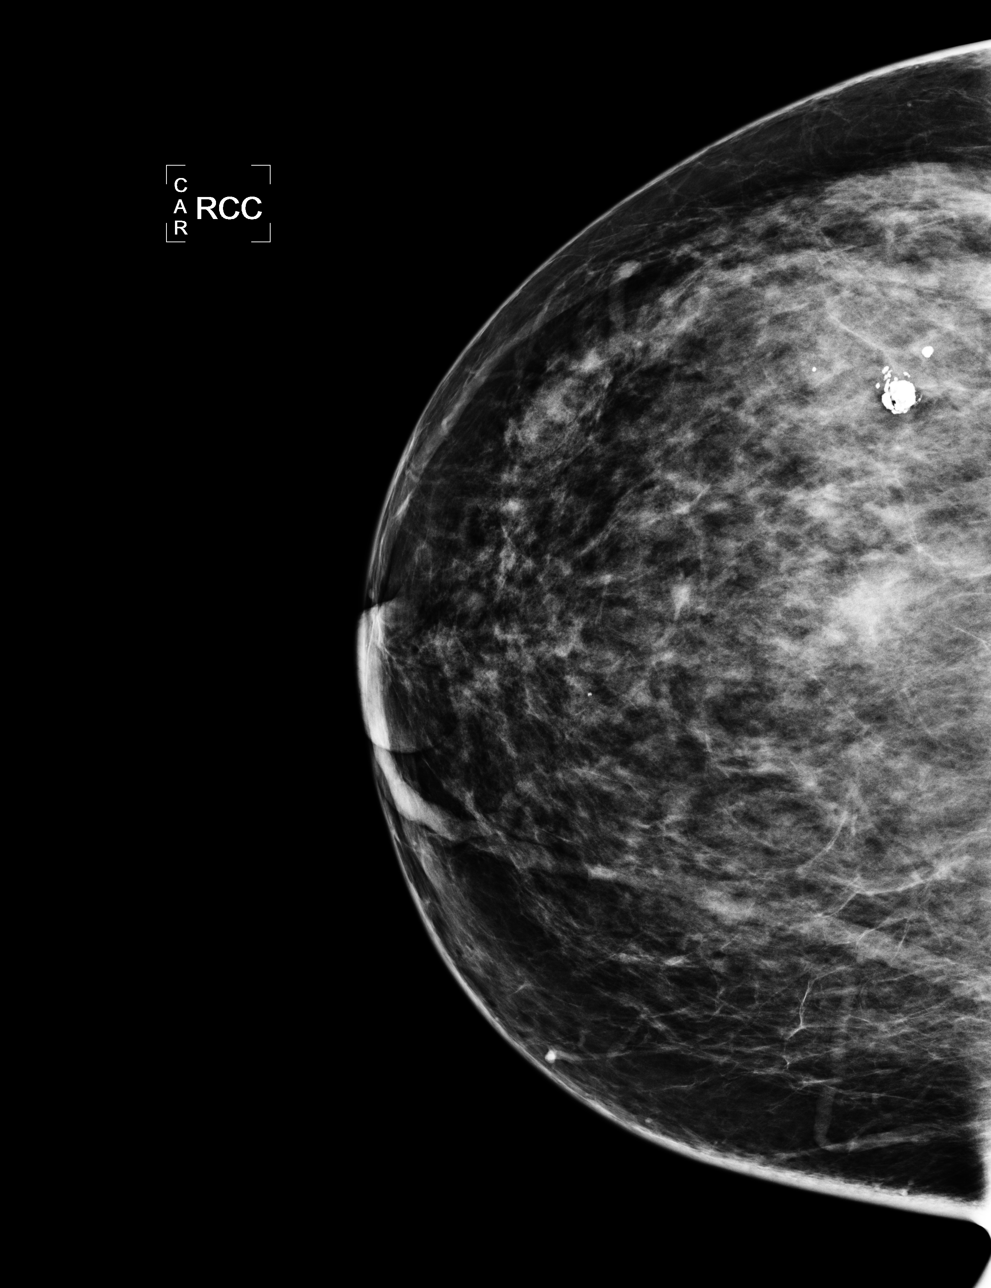

[L CC]
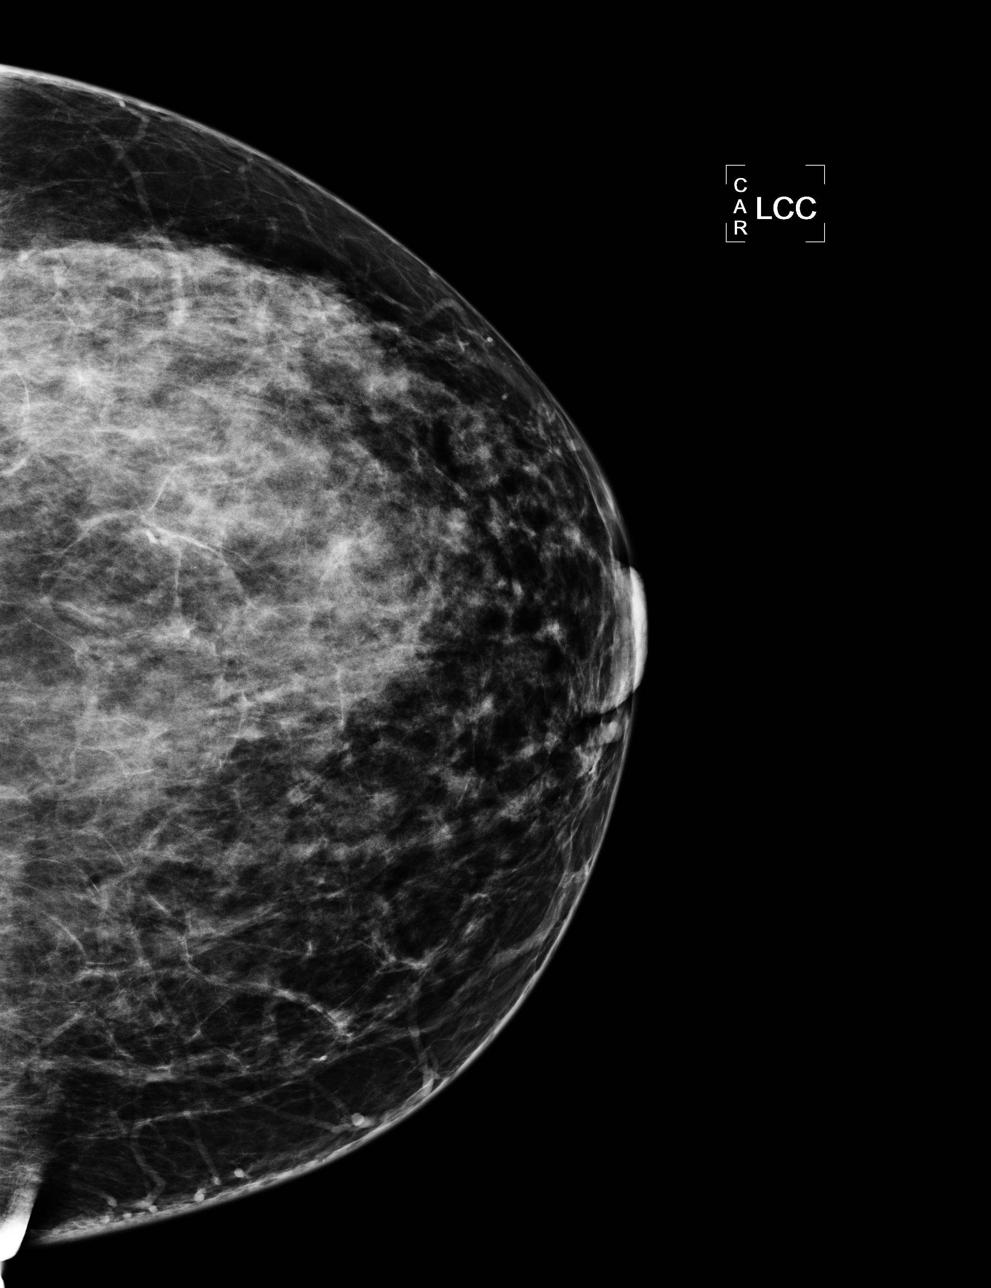

[L MLO]
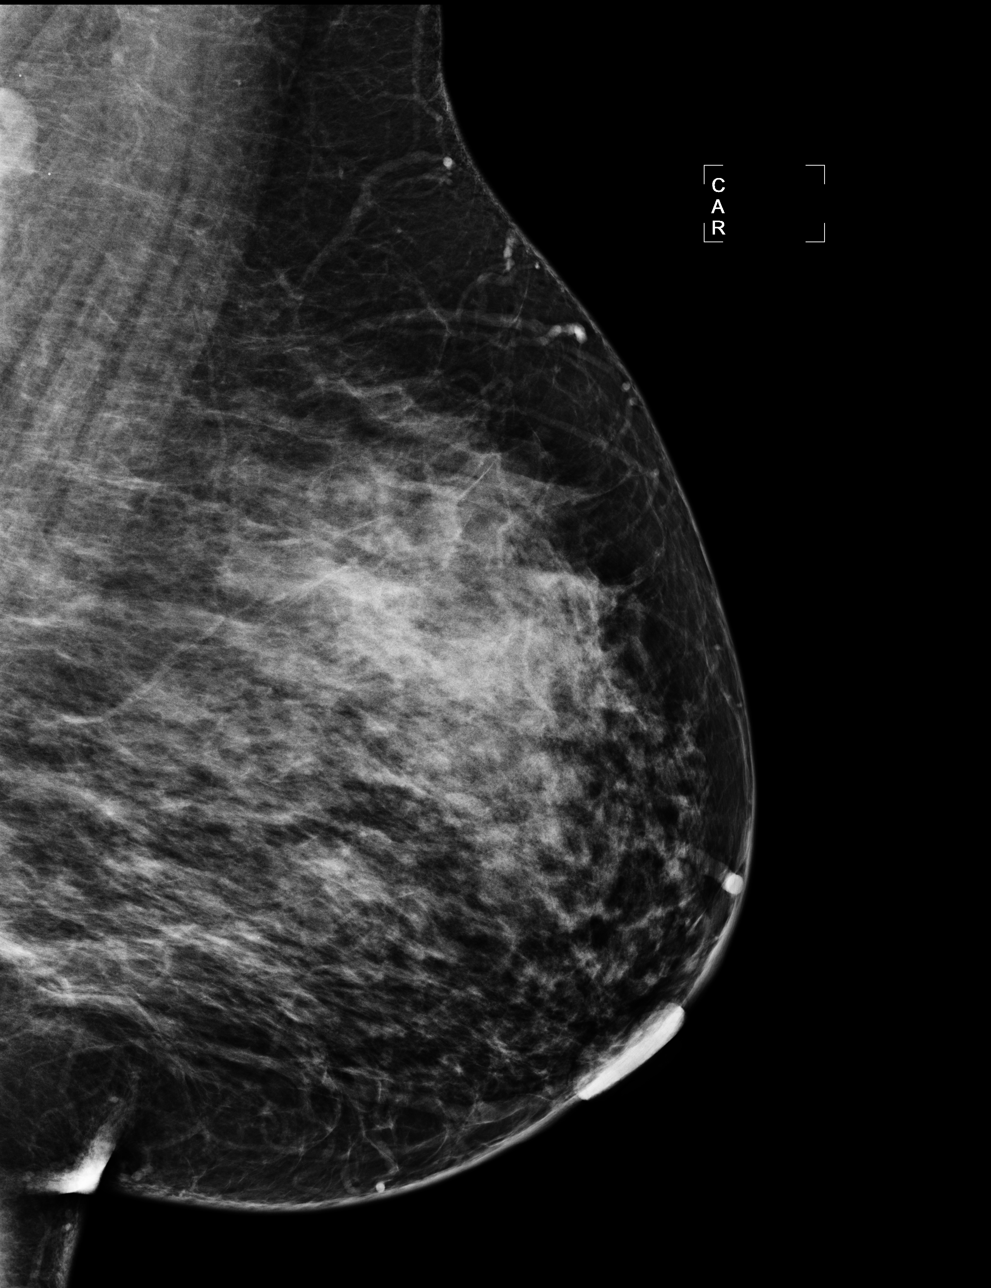

[R MLO]
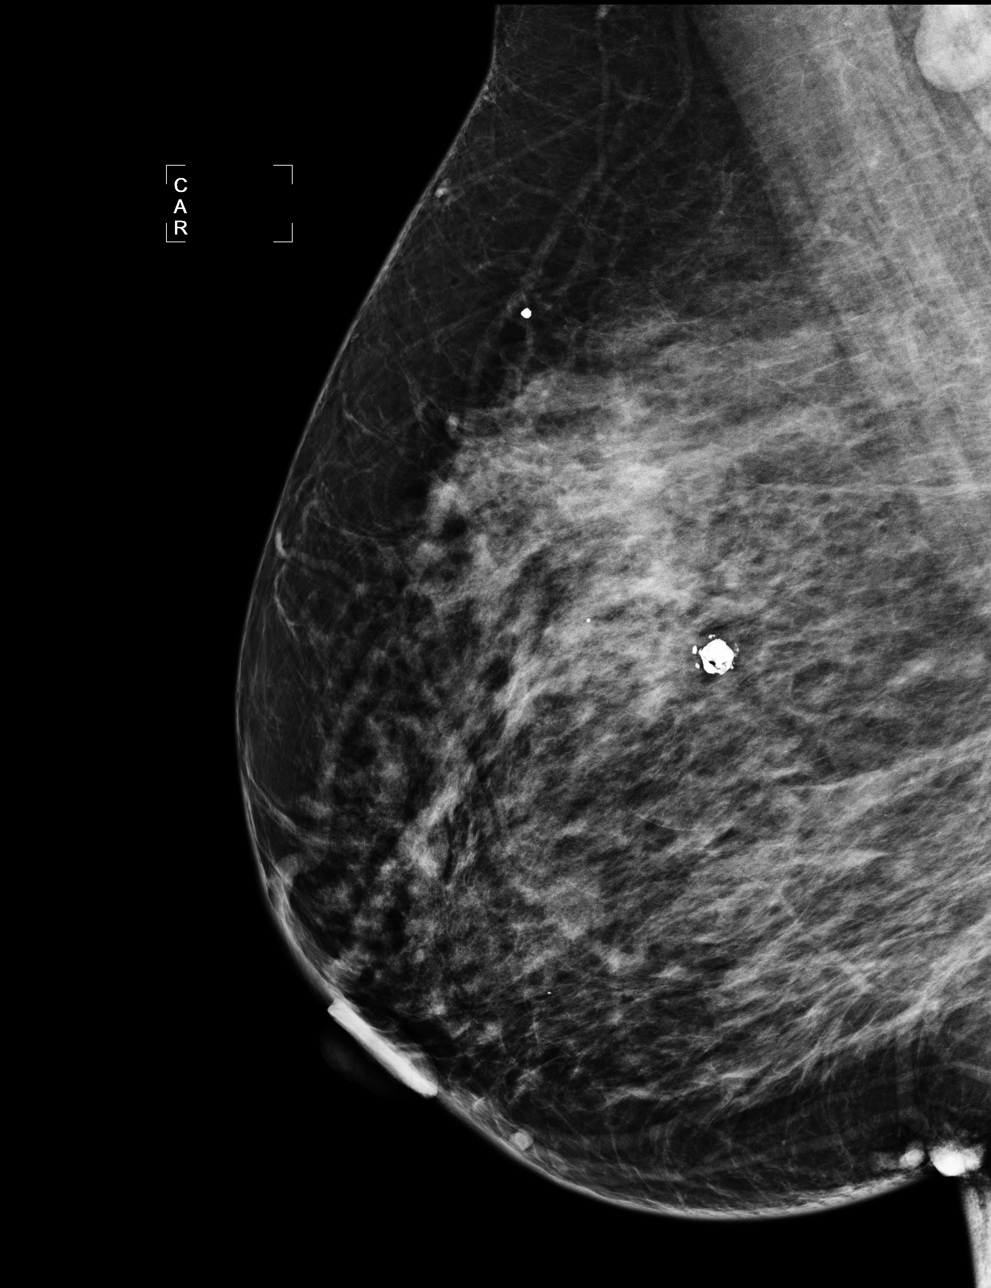

[4 of 4 positions shown; findings below may reference images not displayed]

IMPRESSION: No specific mammographic evidence of malignancy.  Next screening mammogram is recommended in one 
year.

ASSESSMENT: Negative - BI-RADS 1

Screening mammogram in 1 year.
ANALYZED BY COMPUTER AIDED DETECTION. , THIS PROCEDURE WAS A DIGITAL MAMMOGRAM.

## 2010-05-31 IMAGING — CT CT HEAD W/O CM
4 of 5 series · 17 of 47 positions shown, 18 images · non-contrast
Comparison: None

CT HEAD

CLINICAL DATA: Fall, neck pain, diabetes

CT HEAD WITHOUT CONTRAST,CT CERVICAL SPINE WITHOUT CONTRAST
TECHNIQUE: Contiguous axial images were obtained from the base of
the skull through the vertex without contrast., Technique:
Multidetector CT imaging of the cervical spine was performed.
Multiplanar CT image reconstructions were also generated.

[Series 3: head trauma 4.8 h37s · axial · 0.43mm/px · z∈[-142,-70]mm · 3 of 30 slices shown, 4 images]
[im 8/30  brain]
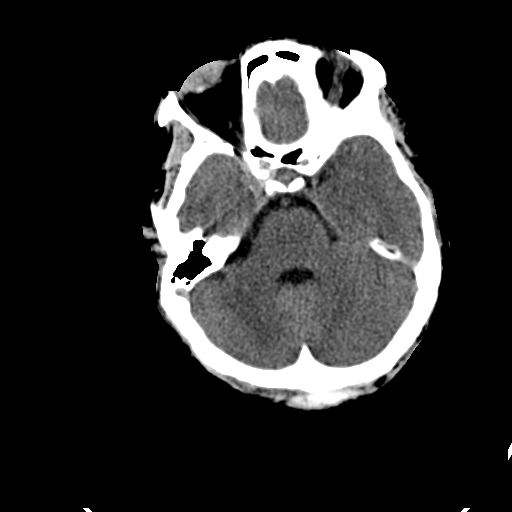
[im 8/30  bone]
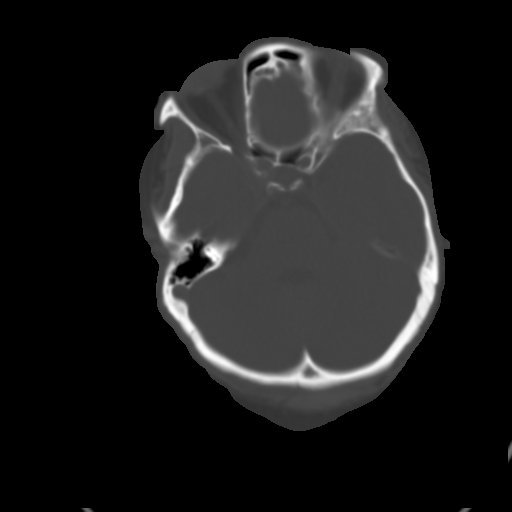
[im 15/30  brain]
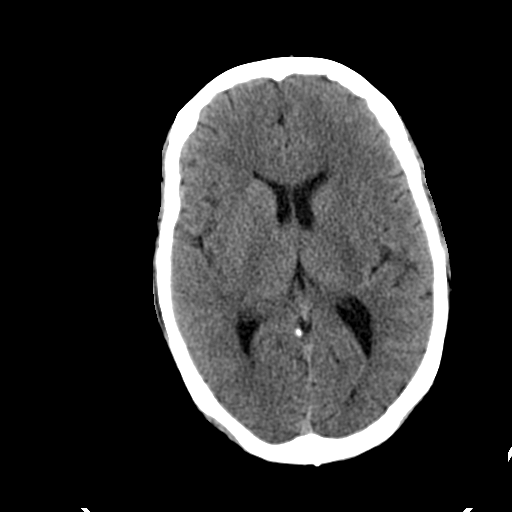
[im 22/30  brain]
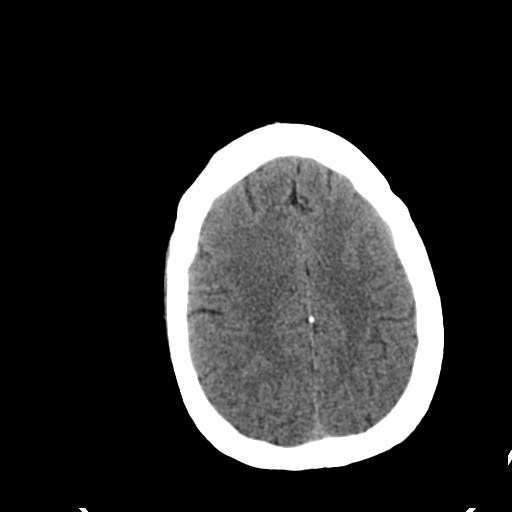

[Series 602: cor cspine · coronal · 0.38mm/px · 3 of 35 slices shown]
[im 12/35  brain]
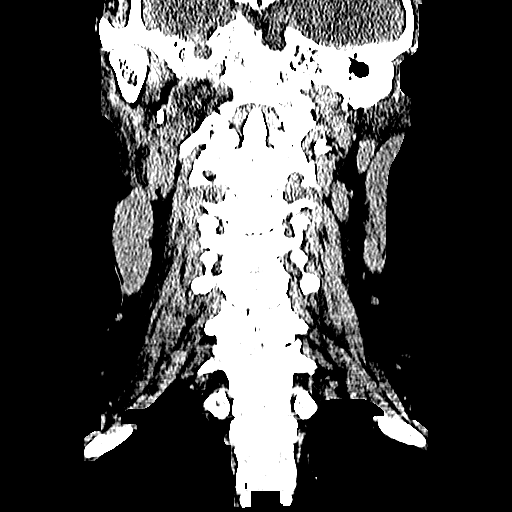
[im 16/35  brain]
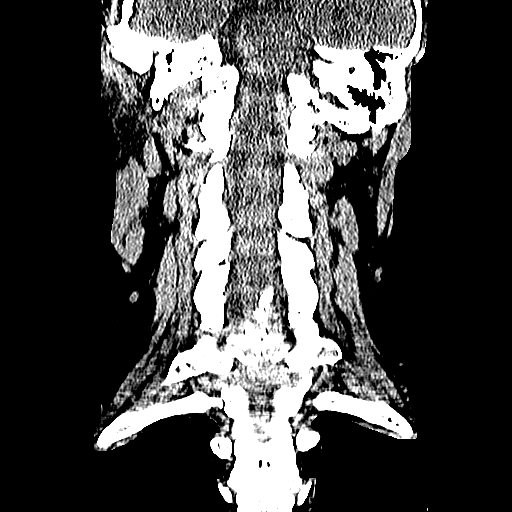
[im 19/35  brain]
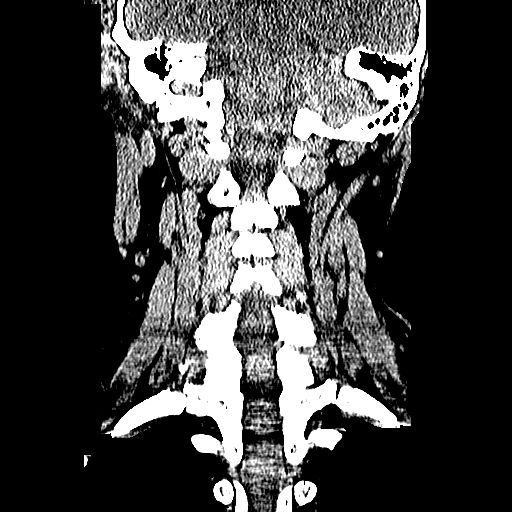

[Series 603: axial cspine · axial · 0.38mm/px · z∈[-340,-246]mm · 8 of 64 slices shown]
[im 8/64  brain]
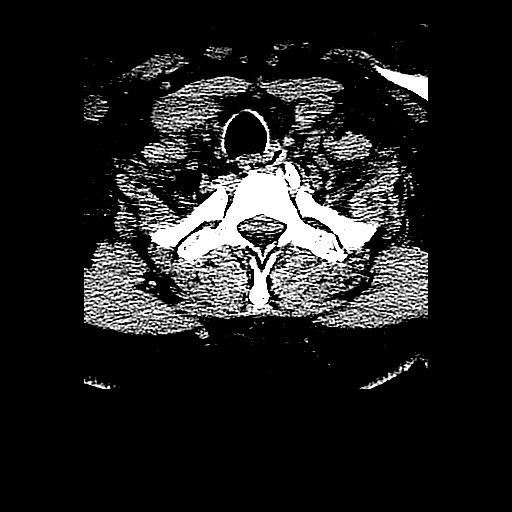
[im 15/64  brain]
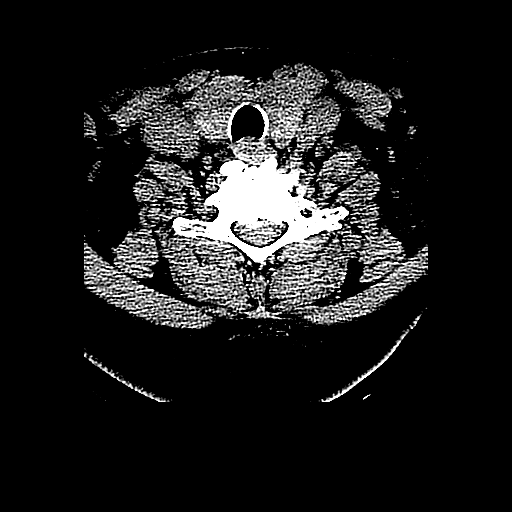
[im 22/64  brain]
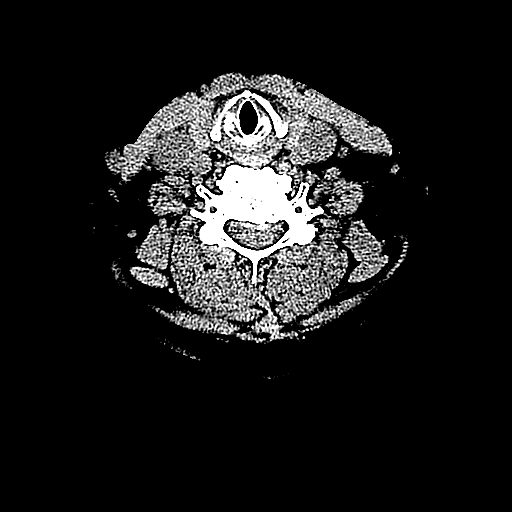
[im 29/64  brain]
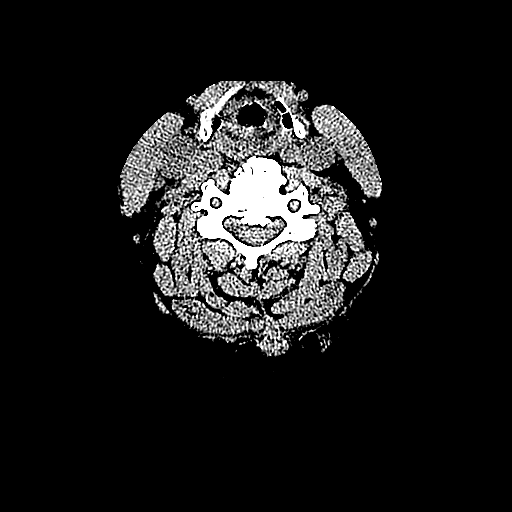
[im 36/64  brain]
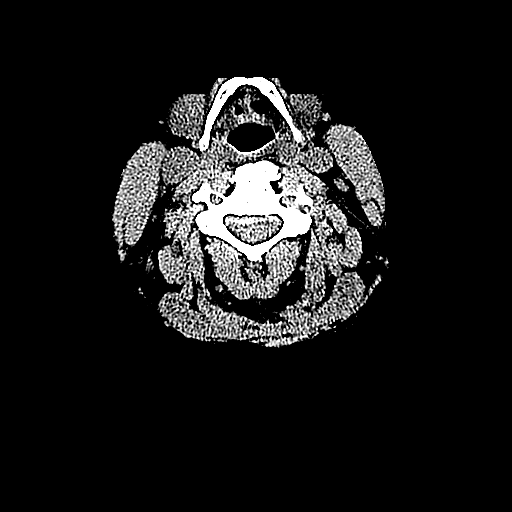
[im 43/64  brain]
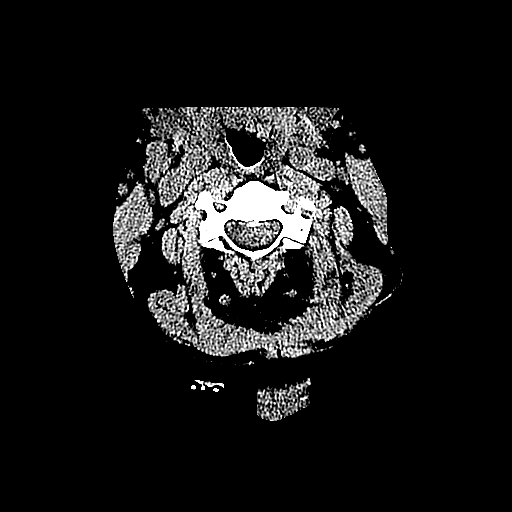
[im 50/64  brain]
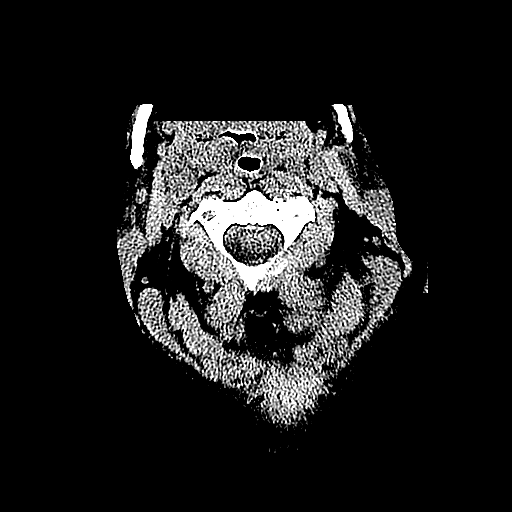
[im 57/64  brain]
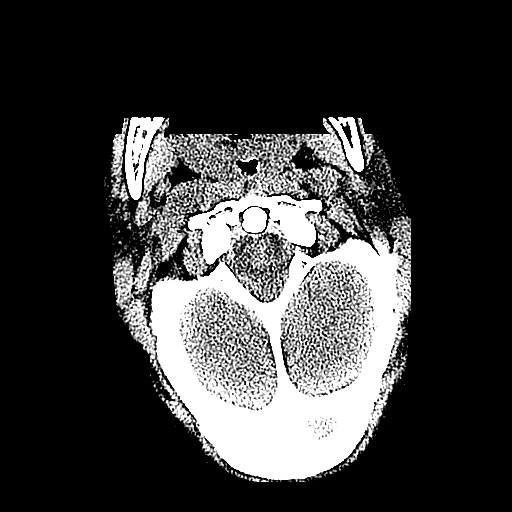

[Series 604: sag cspine · sagittal · 0.38mm/px · 3 of 37 slices shown]
[im 13/37  brain]
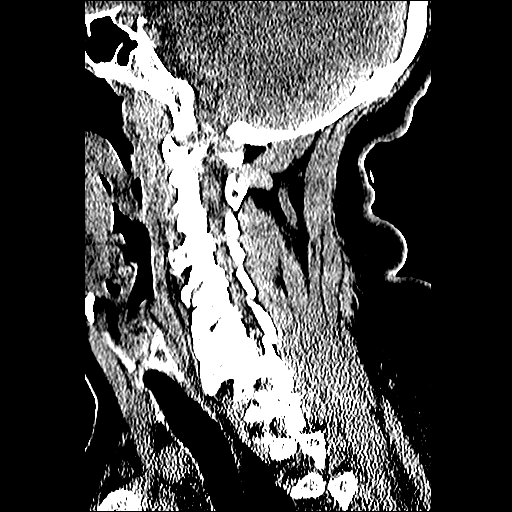
[im 19/37  brain]
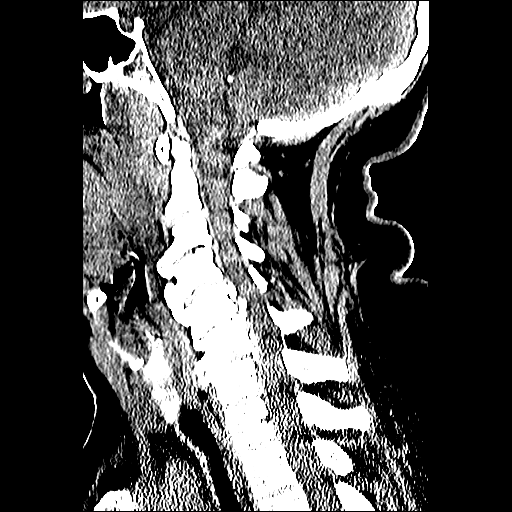
[im 25/37  brain]
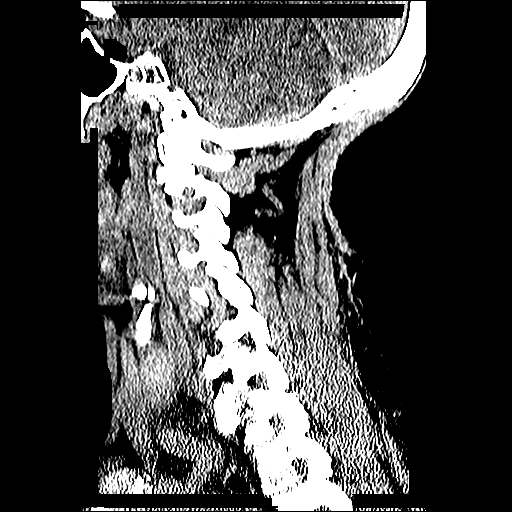

[17 of 47 positions shown; findings below may reference images not displayed]

FINDINGS: No extra-axial fluid collections or intraparenchymal
hemorrhage.  No midline shift or mass effect.  Ventricles are
normal volume.  Basilar cisterns are patent.  Paranasal sinuses and
mastoid air cells are clear.  There is a prosthetic in the left
globe.
IMPRESSION: No evidence of acute intracranial trauma.

CT CERVICAL SPINE
FINDINGS: No prevertebral soft tissue swelling.  No evidence of
subluxation.  Craniocervical junction is intact.  There is
extensive endplate osteophytosis and uncovertebral hypertrophy from
C3 through C7.  Partial calcification of the anterior longitudinal
ligament through this region. No evidence of epidural or paraspinal
hematoma.
IMPRESSION: 1.    No evidence of cervical spine fracture.

2.    Multilevel disc osteophytic disease.

## 2010-05-31 IMAGING — CR DG THORACIC SPINE 2V
3 series · 3 of 3 positions shown · non-contrast
Comparison: Chest radiograph 02/19/2008

CLINICAL DATA: Fall, neck pain

THORACIC SPINE - 2 VIEW

[t t-spine a.p.]
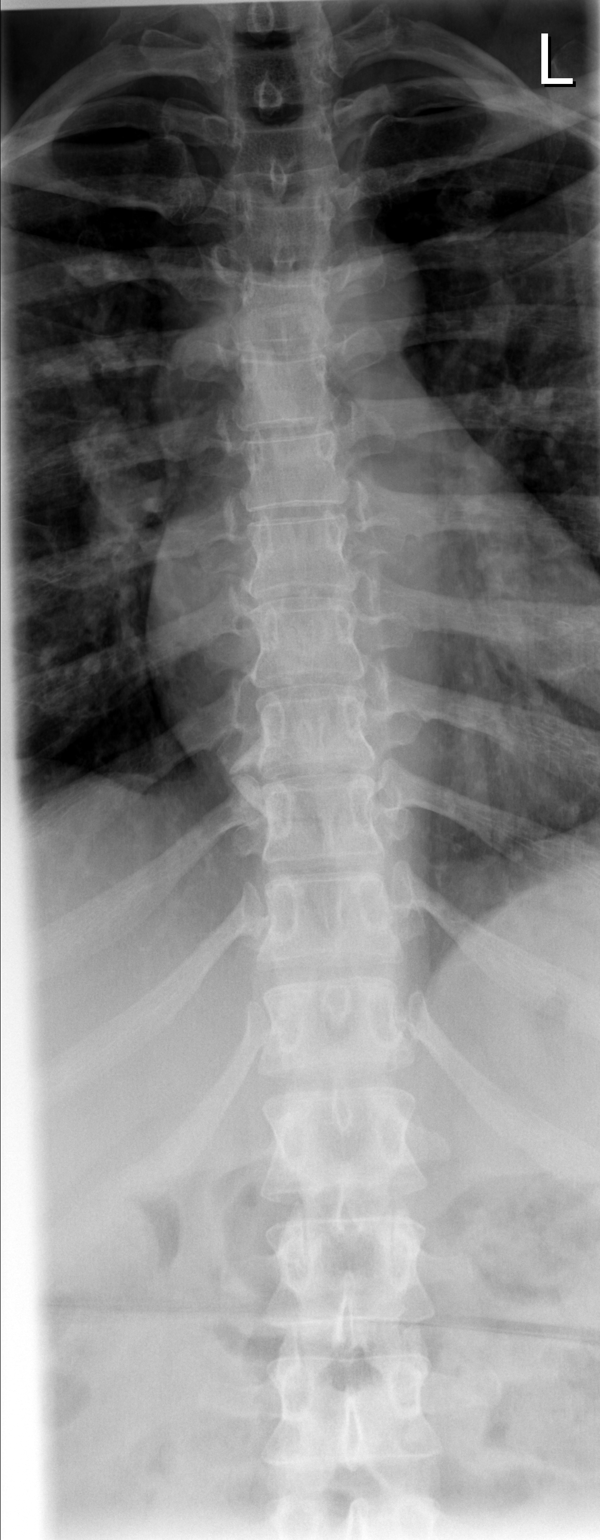

[t t-spine lat]
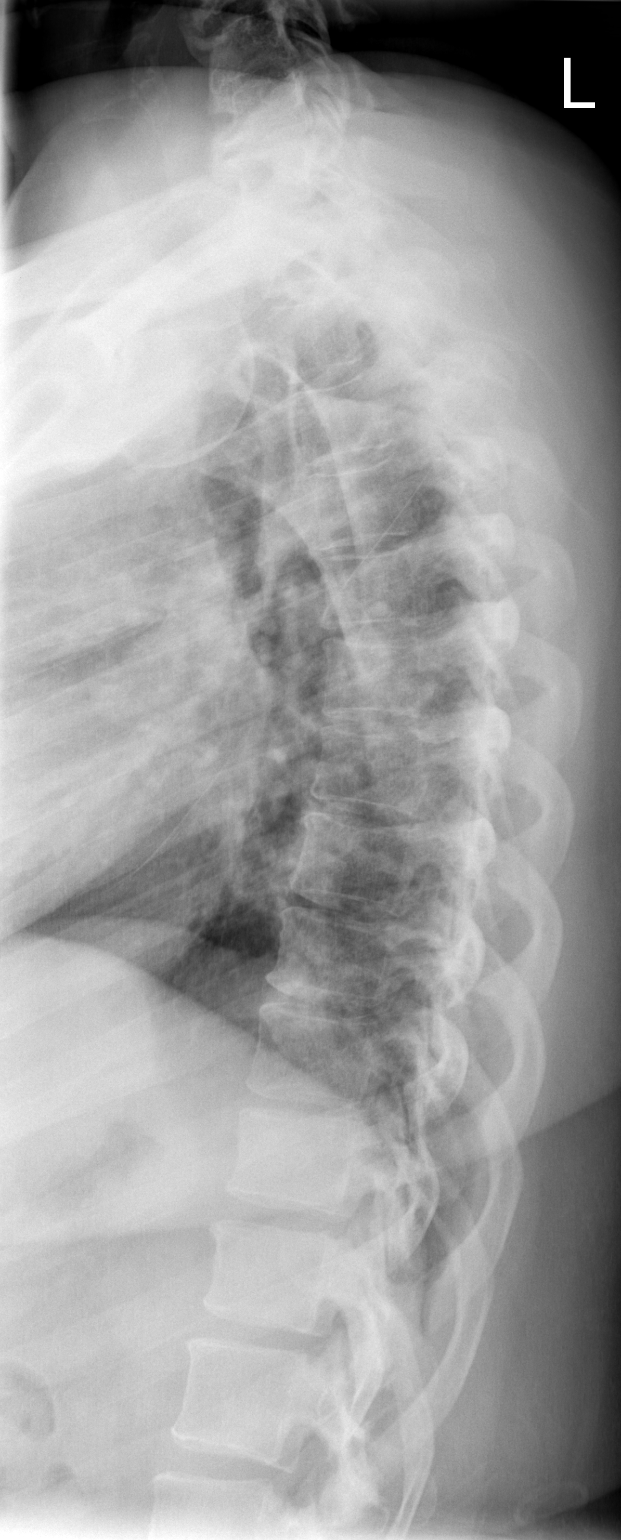

[t swimmers]
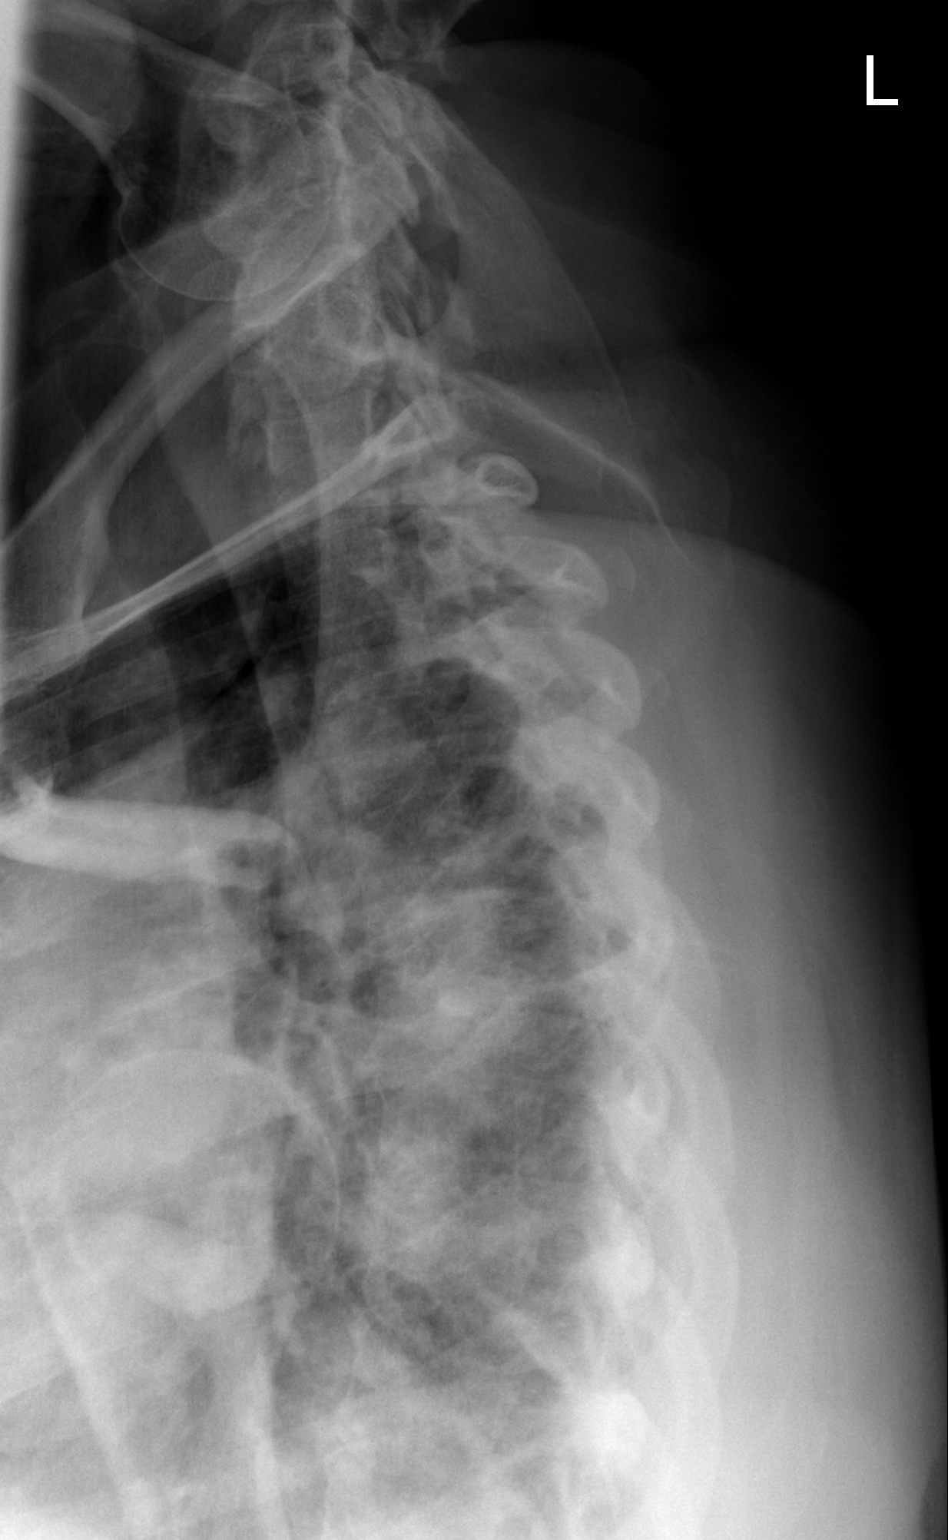

[3 of 3 positions shown; findings below may reference images not displayed]

FINDINGS: Normal alignment of the thoracic vertebral bodies.  No
evidence of subluxation or traumatic loss of vertebral body height.
Normal paraspinal lines.
IMPRESSION: No evidence of thoracic spine fracture.

## 2010-06-06 ENCOUNTER — Encounter: Payer: Self-pay | Admitting: Family Medicine

## 2010-06-06 ENCOUNTER — Encounter: Payer: Self-pay | Admitting: Gastroenterology

## 2010-06-06 ENCOUNTER — Encounter: Payer: Self-pay | Admitting: Internal Medicine

## 2010-07-17 IMAGING — RF DG CHOLANGIOGRAM OPERATIVE
1 series · 15 of 15 positions shown · non-contrast
Comparison: Abdominal ultrasound 02/20/2008

CLINICAL DATA: Symptomatic cholelithiasis.  Laparoscopic
cholecystectomy.

INTRAOPERATIVE CHOLANGIOGRAM
TECHNIQUE: Multiple fluoroscopic spot radiographs were obtained
during intraoperative cholangiogram and are submitted for
interpretation post-operatively.
Fluoroscopy Time: 22 seconds

[Series 1: run · 4 acquisitions, 15 frames shown]
[im 1/4]
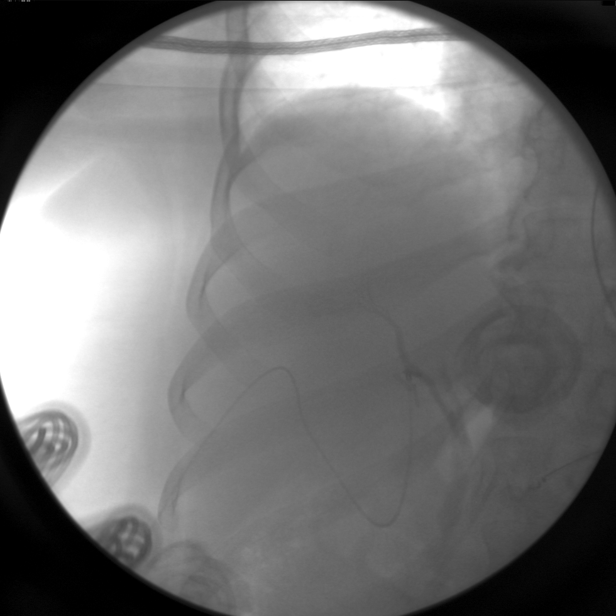
[im 1/4]
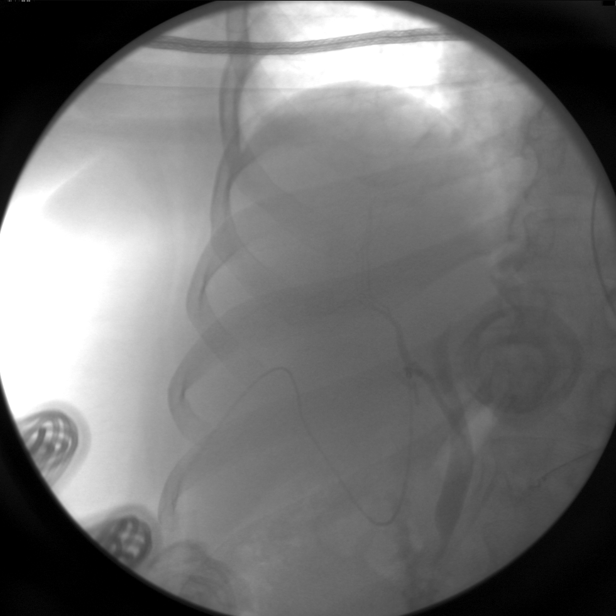
[im 1/4]
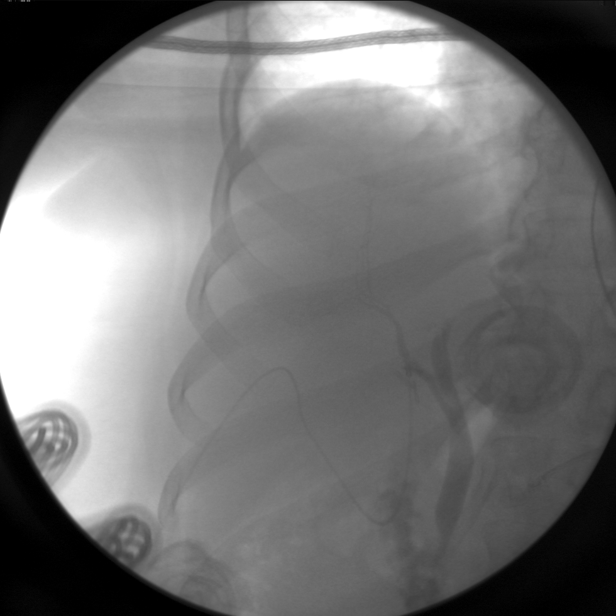
[im 1/4]
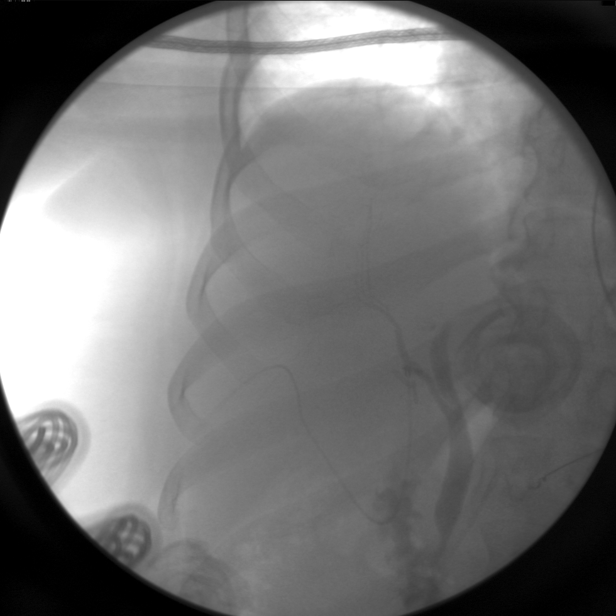
[im 2/4]
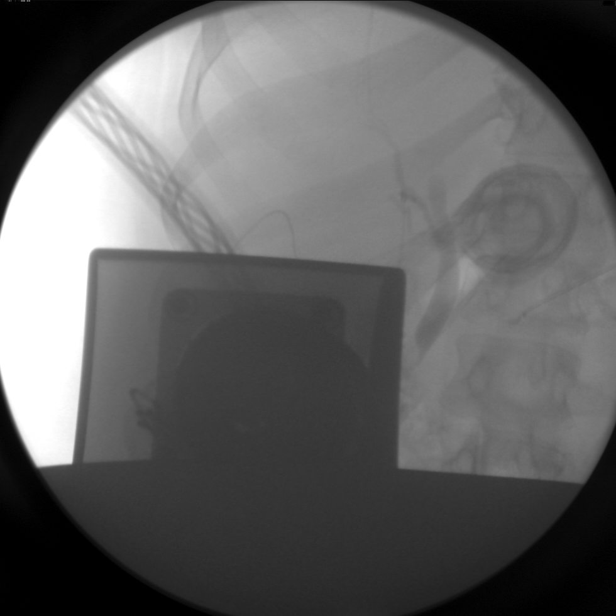
[im 2/4]
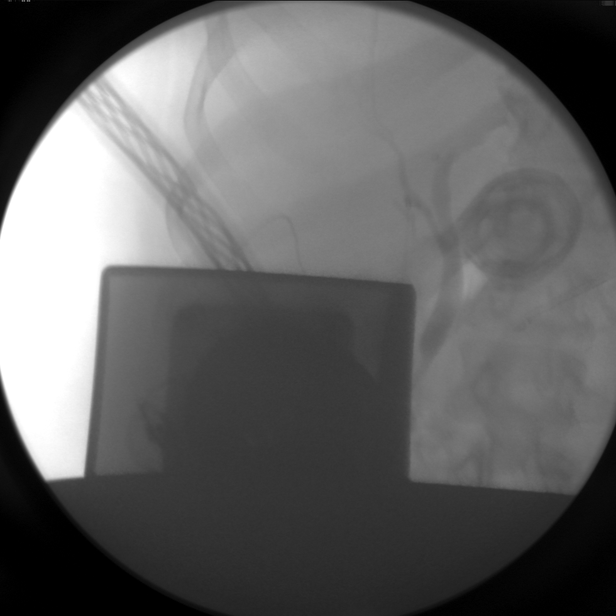
[im 2/4]
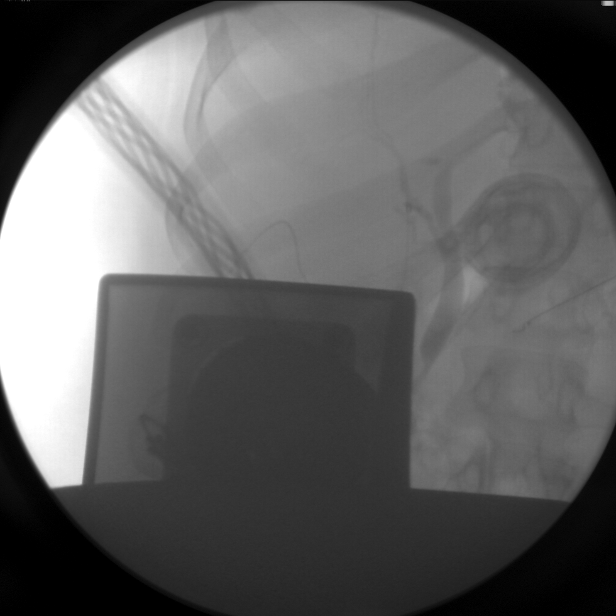
[im 3/4]
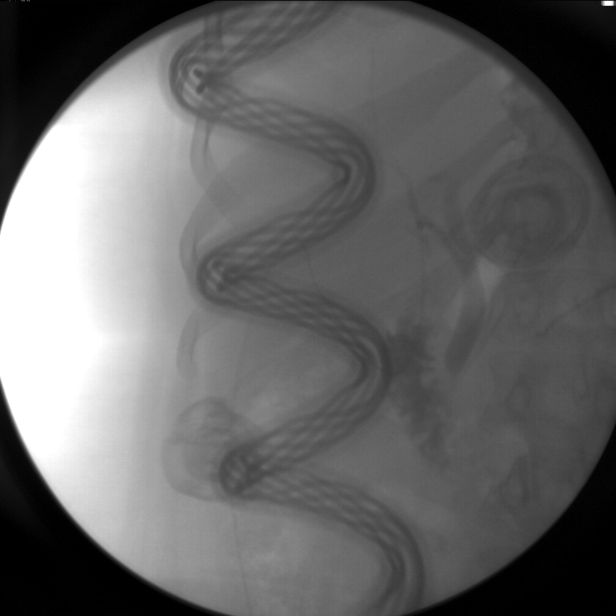
[im 3/4]
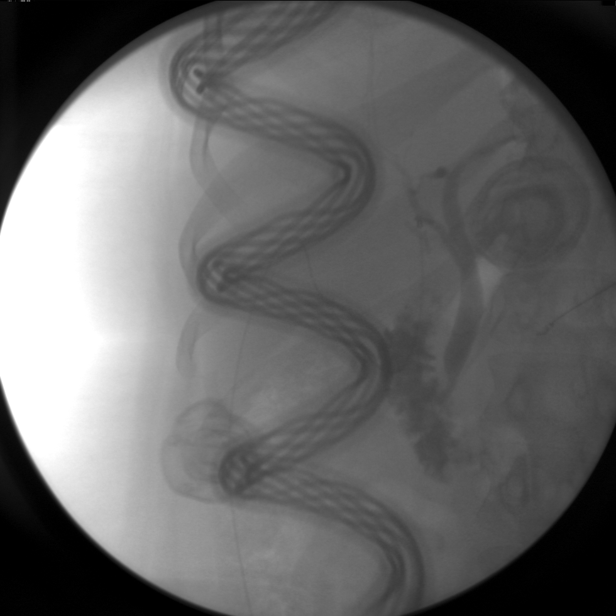
[im 3/4]
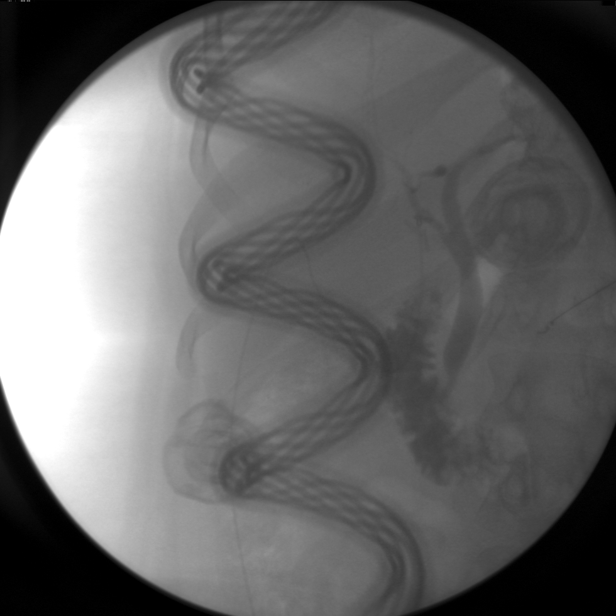
[im 3/4]
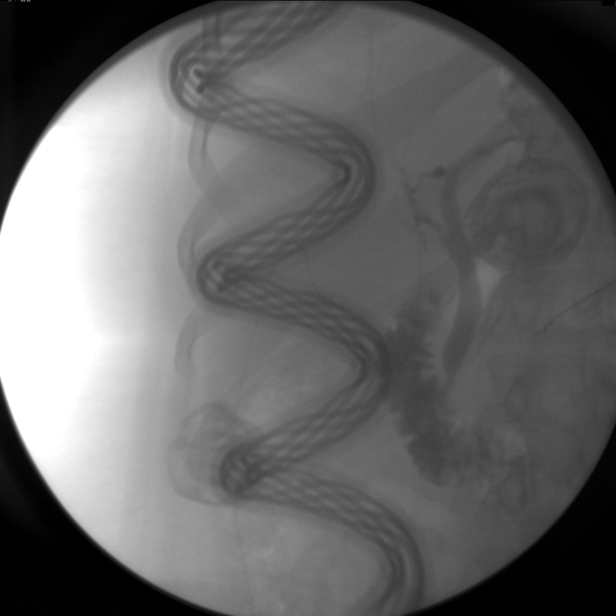
[im 4/4]
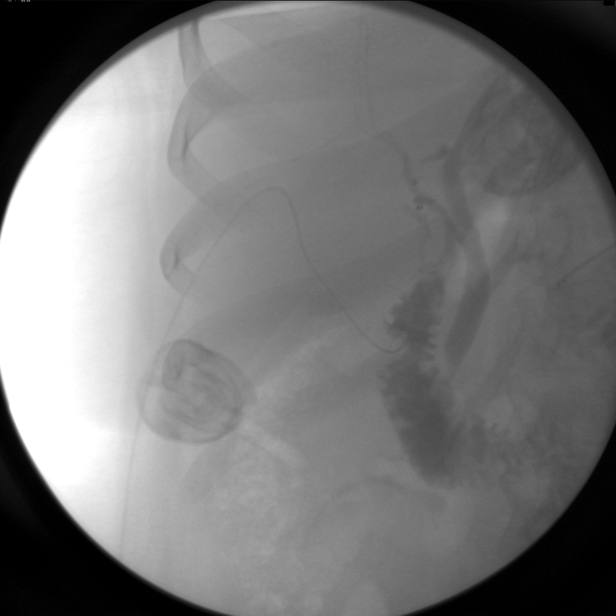
[im 4/4]
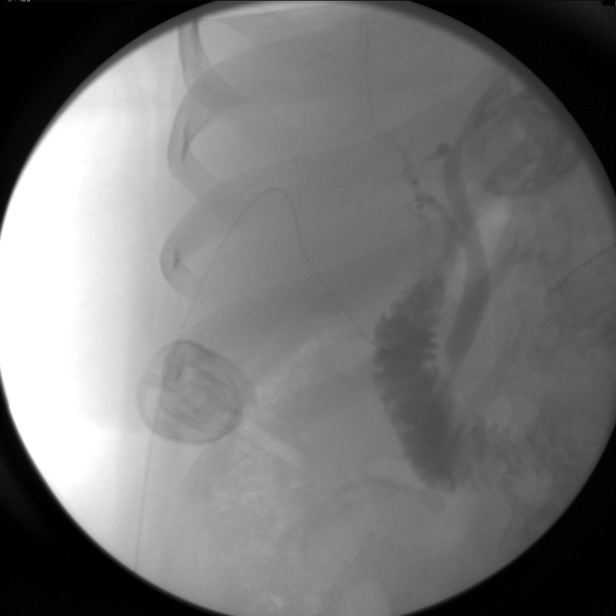
[im 4/4]
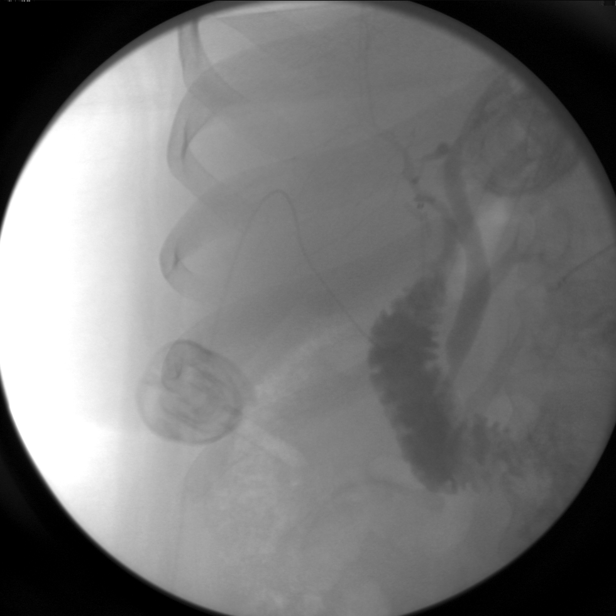
[im 4/4]
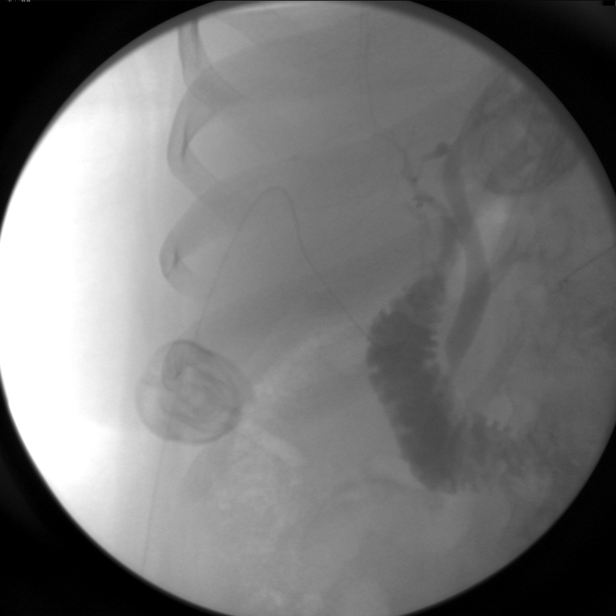

[15 of 15 positions shown; findings below may reference images not displayed]

FINDINGS: Four series are submitted.  On the final series, the
common bile duct is normal in caliber without retained calculi.
There is drainage into the duodenum.  No ductal obstruction or
extravasation is identified.
IMPRESSION: No evidence of ductal obstruction or retained calculus.

## 2010-07-28 LAB — COMPREHENSIVE METABOLIC PANEL
CO2: 29 mEq/L (ref 19–32)
Calcium: 8.9 mg/dL (ref 8.4–10.5)
Creatinine, Ser: 0.68 mg/dL (ref 0.4–1.2)
GFR calc non Af Amer: >60 mL/min (ref >60)
Glucose, Bld: 129 mg/dL — ABNORMAL HIGH (ref 70–99)

## 2010-07-28 LAB — URINALYSIS, ROUTINE W REFLEX MICROSCOPIC
Bilirubin Urine: NEGATIVE
Glucose, UA: 100 mg/dL — AB
Ketones, ur: NEGATIVE mg/dL
Nitrite: NEGATIVE
Protein, ur: NEGATIVE mg/dL
pH: 8 (ref 5.0–8.0)

## 2010-07-28 LAB — DIFFERENTIAL
Lymphocytes Relative: 23 % (ref 12–46)
Lymphs Abs: 2 10*3/uL (ref 0.7–4.0)
Neutro Abs: 6.1 10*3/uL (ref 1.7–7.7)
Neutrophils Relative %: 70 % (ref 43–77)

## 2010-07-28 LAB — LIPASE, BLOOD: Lipase: 19 U/L (ref 11–59)

## 2010-07-28 LAB — GLUCOSE, CAPILLARY: Glucose-Capillary: 100 mg/dL — ABNORMAL HIGH (ref 70–99)

## 2010-07-28 LAB — CBC
HCT: 37.5 % (ref 36.0–46.0)
Hemoglobin: 12.8 g/dL (ref 12.0–15.0)
MCH: 27.2 pg (ref 26.0–34.0)
MCHC: 34.1 g/dL (ref 30.0–36.0)

## 2010-07-29 LAB — GLUCOSE, CAPILLARY

## 2010-07-30 LAB — URINALYSIS, ROUTINE W REFLEX MICROSCOPIC
Bilirubin Urine: NEGATIVE
Ketones, ur: 40 mg/dL — AB
Nitrite: NEGATIVE
Protein, ur: NEGATIVE mg/dL
Urobilinogen, UA: 0.2 mg/dL (ref 0.0–1.0)

## 2010-07-30 LAB — CBC
Hemoglobin: 13.1 g/dL (ref 12.0–15.0)
MCH: 26.8 pg (ref 26.0–34.0)
RBC: 4.88 MIL/uL (ref 3.87–5.11)

## 2010-07-30 LAB — COMPREHENSIVE METABOLIC PANEL
ALT: 16 U/L (ref 0–35)
AST: 13 U/L (ref 0–37)
Alkaline Phosphatase: 94 U/L (ref 39–117)
CO2: 18 mEq/L — ABNORMAL LOW (ref 19–32)
Chloride: 99 mEq/L (ref 96–112)
Creatinine, Ser: 1.16 mg/dL (ref 0.4–1.2)
GFR calc Af Amer: 60 mL/min — ABNORMAL LOW (ref >60)
GFR calc non Af Amer: 49 mL/min — ABNORMAL LOW (ref >60)
Total Bilirubin: 0.8 mg/dL (ref 0.3–1.2)

## 2010-07-30 LAB — LIPASE, BLOOD: Lipase: 25 U/L (ref 11–59)

## 2010-07-30 LAB — DIFFERENTIAL
Basophils Absolute: 0 10*3/uL (ref 0.0–0.1)
Basophils Relative: 0 % (ref 0–1)
Eosinophils Absolute: 0 10*3/uL (ref 0.0–0.7)
Eosinophils Relative: 1 % (ref 0–5)
Lymphocytes Relative: 23 % (ref 12–46)

## 2010-07-30 LAB — GLUCOSE, CAPILLARY: Glucose-Capillary: 323 mg/dL — ABNORMAL HIGH (ref 70–99)

## 2010-07-30 LAB — URINE CULTURE
Colony Count: NO GROWTH
Culture: NO GROWTH

## 2010-07-30 LAB — POCT CARDIAC MARKERS: Myoglobin, poc: 76.7 ng/mL (ref 12–200)

## 2010-08-02 LAB — COMPREHENSIVE METABOLIC PANEL
AST: 12 U/L (ref 0–37)
BUN: 13 mg/dL (ref 6–23)
CO2: 29 mEq/L (ref 19–32)
Calcium: 9.9 mg/dL (ref 8.4–10.5)
Chloride: 109 mEq/L (ref 96–112)
Creatinine, Ser: 0.8 mg/dL (ref 0.4–1.2)
GFR calc Af Amer: >60 mL/min (ref >60)
GFR calc non Af Amer: >60 mL/min (ref >60)
Sodium: 143 mEq/L (ref 135–145)
Total Bilirubin: 0.7 mg/dL (ref 0.3–1.2)

## 2010-08-02 LAB — DIFFERENTIAL
Basophils Absolute: 0 10*3/uL (ref 0.0–0.1)
Basophils Relative: 0 % (ref 0–1)
Eosinophils Relative: 1 % (ref 0–5)
Lymphocytes Relative: 26 % (ref 12–46)
Lymphs Abs: 2.2 10*3/uL (ref 0.7–4.0)
Neutro Abs: 5.7 10*3/uL (ref 1.7–7.7)

## 2010-08-02 LAB — CBC
HCT: 37.5 % (ref 36.0–46.0)
MCHC: 32.7 g/dL (ref 30.0–36.0)
MCV: 80.6 fL (ref 78.0–100.0)
Platelets: 239 10*3/uL (ref 150–400)
RBC: 4.65 MIL/uL (ref 3.87–5.11)
WBC: 8.6 10*3/uL (ref 4.0–10.5)

## 2010-08-02 LAB — HEMOCCULT GUIAC POC 1CARD (OFFICE): Fecal Occult Bld: NEGATIVE

## 2010-08-04 LAB — COMPREHENSIVE METABOLIC PANEL
ALT: 15 U/L (ref 0–35)
AST: 17 U/L (ref 0–37)
Albumin: 3.4 g/dL — ABNORMAL LOW (ref 3.5–5.2)
Chloride: 104 mEq/L (ref 96–112)
Creatinine, Ser: 1.15 mg/dL (ref 0.4–1.2)
GFR calc Af Amer: >60 mL/min (ref >60)
Potassium: 4.8 mEq/L (ref 3.5–5.1)
Sodium: 140 mEq/L (ref 135–145)
Total Bilirubin: 0.8 mg/dL (ref 0.3–1.2)

## 2010-08-04 LAB — GLUCOSE, CAPILLARY: Glucose-Capillary: 200 mg/dL — ABNORMAL HIGH (ref 70–99)

## 2010-08-04 LAB — DIFFERENTIAL
Basophils Absolute: 0 10*3/uL (ref 0.0–0.1)
Eosinophils Absolute: 0.1 10*3/uL (ref 0.0–0.7)
Eosinophils Relative: 2 % (ref 0–5)
Lymphocytes Relative: 26 % (ref 12–46)
Monocytes Absolute: 1 10*3/uL (ref 0.1–1.0)

## 2010-08-04 LAB — CBC
MCV: 81.5 fL (ref 78.0–100.0)
Platelets: 257 10*3/uL (ref 150–400)
RBC: 4.55 MIL/uL (ref 3.87–5.11)
WBC: 9.4 10*3/uL (ref 4.0–10.5)

## 2010-08-09 LAB — BASIC METABOLIC PANEL
BUN: 17 mg/dL (ref 6–23)
CO2: 29 mEq/L (ref 19–32)
Chloride: 104 mEq/L (ref 96–112)
Creatinine, Ser: 1.16 mg/dL (ref 0.4–1.2)

## 2010-08-09 LAB — CBC
MCHC: 33.9 g/dL (ref 30.0–36.0)
MCV: 80.3 fL (ref 78.0–100.0)
Platelets: 225 10*3/uL (ref 150–400)
WBC: 9.8 10*3/uL (ref 4.0–10.5)

## 2010-08-09 LAB — DIFFERENTIAL
Basophils Relative: 1 % (ref 0–1)
Eosinophils Absolute: 0.1 10*3/uL (ref 0.0–0.7)
Neutrophils Relative %: 58 % (ref 43–77)

## 2010-08-18 LAB — COMPREHENSIVE METABOLIC PANEL
ALT: 29 U/L (ref 0–35)
ALT: 31 U/L (ref 0–35)
ALT: 13 U/L (ref 0–35)
ALT: 18 U/L (ref 0–35)
AST: 14 U/L (ref 0–37)
AST: 22 U/L (ref 0–37)
Albumin: 3.4 g/dL — ABNORMAL LOW (ref 3.5–5.2)
Alkaline Phosphatase: 84 U/L (ref 39–117)
Alkaline Phosphatase: 74 U/L (ref 39–117)
BUN: 11 mg/dL (ref 6–23)
BUN: 11 mg/dL (ref 6–23)
CO2: 30 mEq/L (ref 19–32)
CO2: 23 mEq/L (ref 19–32)
CO2: 25 mEq/L (ref 19–32)
CO2: 28 mEq/L (ref 19–32)
Calcium: 9.4 mg/dL (ref 8.4–10.5)
Calcium: 7.1 mg/dL — ABNORMAL LOW (ref 8.4–10.5)
Calcium: 9.2 mg/dL (ref 8.4–10.5)
Chloride: 109 mEq/L (ref 96–112)
Chloride: 111 mEq/L (ref 96–112)
Chloride: 93 mEq/L — ABNORMAL LOW (ref 96–112)
Creatinine, Ser: 1.06 mg/dL (ref 0.4–1.2)
Creatinine, Ser: 2.17 mg/dL — ABNORMAL HIGH (ref 0.4–1.2)
GFR calc Af Amer: 54 mL/min — ABNORMAL LOW (ref >60)
GFR calc Af Amer: >60 mL/min (ref >60)
GFR calc non Af Amer: 47 mL/min — ABNORMAL LOW (ref >60)
GFR calc non Af Amer: 24 mL/min — ABNORMAL LOW (ref >60)
GFR calc non Af Amer: 31 mL/min — ABNORMAL LOW (ref >60)
Glucose, Bld: 195 mg/dL — ABNORMAL HIGH (ref 70–99)
Glucose, Bld: 89 mg/dL (ref 70–99)
Glucose, Bld: 223 mg/dL — ABNORMAL HIGH (ref 70–99)
Glucose, Bld: 234 mg/dL — ABNORMAL HIGH (ref 70–99)
Potassium: 3.3 mEq/L — ABNORMAL LOW (ref 3.5–5.1)
Potassium: 3.9 mEq/L (ref 3.5–5.1)
Sodium: 139 mEq/L (ref 135–145)
Sodium: 131 mEq/L — ABNORMAL LOW (ref 135–145)
Total Bilirubin: 0.4 mg/dL (ref 0.3–1.2)
Total Bilirubin: 0.4 mg/dL (ref 0.3–1.2)
Total Bilirubin: 0.5 mg/dL (ref 0.3–1.2)
Total Protein: 6.1 g/dL (ref 6.0–8.3)
Total Protein: 7.7 g/dL (ref 6.0–8.3)
Total Protein: 5.7 g/dL — ABNORMAL LOW (ref 6.0–8.3)

## 2010-08-18 LAB — GLUCOSE, CAPILLARY
Glucose-Capillary: 121 mg/dL — ABNORMAL HIGH (ref 70–99)
Glucose-Capillary: 124 mg/dL — ABNORMAL HIGH (ref 70–99)
Glucose-Capillary: 133 mg/dL — ABNORMAL HIGH (ref 70–99)
Glucose-Capillary: 139 mg/dL — ABNORMAL HIGH (ref 70–99)
Glucose-Capillary: 172 mg/dL — ABNORMAL HIGH (ref 70–99)
Glucose-Capillary: 264 mg/dL — ABNORMAL HIGH (ref 70–99)
Glucose-Capillary: 305 mg/dL — ABNORMAL HIGH (ref 70–99)
Glucose-Capillary: 51 mg/dL — ABNORMAL LOW (ref 70–99)
Glucose-Capillary: 61 mg/dL — ABNORMAL LOW (ref 70–99)
Glucose-Capillary: 68 mg/dL — ABNORMAL LOW (ref 70–99)
Glucose-Capillary: 83 mg/dL (ref 70–99)
Glucose-Capillary: 87 mg/dL (ref 70–99)
Glucose-Capillary: 107 mg/dL — ABNORMAL HIGH (ref 70–99)
Glucose-Capillary: 124 mg/dL — ABNORMAL HIGH (ref 70–99)
Glucose-Capillary: 149 mg/dL — ABNORMAL HIGH (ref 70–99)
Glucose-Capillary: 149 mg/dL — ABNORMAL HIGH (ref 70–99)
Glucose-Capillary: 155 mg/dL — ABNORMAL HIGH (ref 70–99)
Glucose-Capillary: 174 mg/dL — ABNORMAL HIGH (ref 70–99)
Glucose-Capillary: 186 mg/dL — ABNORMAL HIGH (ref 70–99)
Glucose-Capillary: 203 mg/dL — ABNORMAL HIGH (ref 70–99)
Glucose-Capillary: 203 mg/dL — ABNORMAL HIGH (ref 70–99)
Glucose-Capillary: 205 mg/dL — ABNORMAL HIGH (ref 70–99)
Glucose-Capillary: 217 mg/dL — ABNORMAL HIGH (ref 70–99)
Glucose-Capillary: 226 mg/dL — ABNORMAL HIGH (ref 70–99)
Glucose-Capillary: 233 mg/dL — ABNORMAL HIGH (ref 70–99)
Glucose-Capillary: 240 mg/dL — ABNORMAL HIGH (ref 70–99)
Glucose-Capillary: 245 mg/dL — ABNORMAL HIGH (ref 70–99)
Glucose-Capillary: 248 mg/dL — ABNORMAL HIGH (ref 70–99)
Glucose-Capillary: 263 mg/dL — ABNORMAL HIGH (ref 70–99)
Glucose-Capillary: 264 mg/dL — ABNORMAL HIGH (ref 70–99)
Glucose-Capillary: 265 mg/dL — ABNORMAL HIGH (ref 70–99)
Glucose-Capillary: 280 mg/dL — ABNORMAL HIGH (ref 70–99)
Glucose-Capillary: 280 mg/dL — ABNORMAL HIGH (ref 70–99)
Glucose-Capillary: 288 mg/dL — ABNORMAL HIGH (ref 70–99)
Glucose-Capillary: 337 mg/dL — ABNORMAL HIGH (ref 70–99)
Glucose-Capillary: 44 mg/dL — ABNORMAL LOW (ref 70–99)
Glucose-Capillary: 520 mg/dL (ref 70–99)
Glucose-Capillary: 53 mg/dL — ABNORMAL LOW (ref 70–99)
Glucose-Capillary: 55 mg/dL — ABNORMAL LOW (ref 70–99)
Glucose-Capillary: 66 mg/dL — ABNORMAL LOW (ref 70–99)
Glucose-Capillary: 74 mg/dL (ref 70–99)
Glucose-Capillary: 82 mg/dL (ref 70–99)

## 2010-08-18 LAB — BASIC METABOLIC PANEL
BUN: 6 mg/dL (ref 6–23)
BUN: 11 mg/dL (ref 6–23)
BUN: 11 mg/dL (ref 6–23)
BUN: 14 mg/dL (ref 6–23)
BUN: 6 mg/dL (ref 6–23)
BUN: 7 mg/dL (ref 6–23)
BUN: 9 mg/dL (ref 6–23)
CO2: 22 mEq/L (ref 19–32)
CO2: 26 mEq/L (ref 19–32)
CO2: 29 mEq/L (ref 19–32)
CO2: 30 mEq/L (ref 19–32)
Calcium: 7.7 mg/dL — ABNORMAL LOW (ref 8.4–10.5)
Calcium: 8.2 mg/dL — ABNORMAL LOW (ref 8.4–10.5)
Calcium: 8.5 mg/dL (ref 8.4–10.5)
Calcium: 8.9 mg/dL (ref 8.4–10.5)
Calcium: 8.9 mg/dL (ref 8.4–10.5)
Chloride: 110 mEq/L (ref 96–112)
Chloride: 116 mEq/L — ABNORMAL HIGH (ref 96–112)
Chloride: 98 mEq/L (ref 96–112)
Creatinine, Ser: 0.86 mg/dL (ref 0.4–1.2)
Creatinine, Ser: 1.4 mg/dL — ABNORMAL HIGH (ref 0.4–1.2)
Creatinine, Ser: 1.52 mg/dL — ABNORMAL HIGH (ref 0.4–1.2)
Creatinine, Ser: 1.78 mg/dL — ABNORMAL HIGH (ref 0.4–1.2)
Creatinine, Ser: 2.04 mg/dL — ABNORMAL HIGH (ref 0.4–1.2)
GFR calc Af Amer: 31 mL/min — ABNORMAL LOW (ref >60)
GFR calc Af Amer: 37 mL/min — ABNORMAL LOW (ref >60)
GFR calc Af Amer: 44 mL/min — ABNORMAL LOW (ref >60)
GFR calc Af Amer: 48 mL/min — ABNORMAL LOW (ref >60)
GFR calc non Af Amer: >60 mL/min (ref >60)
GFR calc non Af Amer: 26 mL/min — ABNORMAL LOW (ref >60)
GFR calc non Af Amer: 38 mL/min — ABNORMAL LOW (ref >60)
GFR calc non Af Amer: 40 mL/min — ABNORMAL LOW (ref >60)
GFR calc non Af Amer: >60 mL/min (ref >60)
Glucose, Bld: 90 mg/dL (ref 70–99)
Glucose, Bld: 245 mg/dL — ABNORMAL HIGH (ref 70–99)
Glucose, Bld: 269 mg/dL — ABNORMAL HIGH (ref 70–99)
Glucose, Bld: 311 mg/dL — ABNORMAL HIGH (ref 70–99)
Glucose, Bld: 323 mg/dL — ABNORMAL HIGH (ref 70–99)
Glucose, Bld: 385 mg/dL — ABNORMAL HIGH (ref 70–99)
Glucose, Bld: 46 mg/dL — ABNORMAL LOW (ref 70–99)
Glucose, Bld: 70 mg/dL (ref 70–99)
Potassium: 3.5 mEq/L (ref 3.5–5.1)
Potassium: 3.9 mEq/L (ref 3.5–5.1)
Sodium: 141 mEq/L (ref 135–145)
Sodium: 142 mEq/L (ref 135–145)
Sodium: 145 mEq/L (ref 135–145)
Sodium: 147 mEq/L — ABNORMAL HIGH (ref 135–145)

## 2010-08-18 LAB — CBC
HCT: 29.3 % — ABNORMAL LOW (ref 36.0–46.0)
HCT: 32.2 % — ABNORMAL LOW (ref 36.0–46.0)
HCT: 32.4 % — ABNORMAL LOW (ref 36.0–46.0)
HCT: 34 % — ABNORMAL LOW (ref 36.0–46.0)
HCT: 34.2 % — ABNORMAL LOW (ref 36.0–46.0)
Hemoglobin: 10.1 g/dL — ABNORMAL LOW (ref 12.0–15.0)
Hemoglobin: 11.8 g/dL — ABNORMAL LOW (ref 12.0–15.0)
Hemoglobin: 11 g/dL — ABNORMAL LOW (ref 12.0–15.0)
Hemoglobin: 11 g/dL — ABNORMAL LOW (ref 12.0–15.0)
Hemoglobin: 11.5 g/dL — ABNORMAL LOW (ref 12.0–15.0)
Hemoglobin: 11.5 g/dL — ABNORMAL LOW (ref 12.0–15.0)
Hemoglobin: 11.7 g/dL — ABNORMAL LOW (ref 12.0–15.0)
MCHC: 33.9 g/dL (ref 30.0–36.0)
MCHC: 33.7 g/dL (ref 30.0–36.0)
MCHC: 33.8 g/dL (ref 30.0–36.0)
MCHC: 34 g/dL (ref 30.0–36.0)
MCHC: 34.2 g/dL (ref 30.0–36.0)
MCHC: 34.3 g/dL (ref 30.0–36.0)
MCHC: 34.6 g/dL (ref 30.0–36.0)
MCV: 81.7 fL (ref 78.0–100.0)
MCV: 81.8 fL (ref 78.0–100.0)
MCV: 82.2 fL (ref 78.0–100.0)
MCV: 82.2 fL (ref 78.0–100.0)
MCV: 82.6 fL (ref 78.0–100.0)
MCV: 82.6 fL (ref 78.0–100.0)
Platelets: 129 10*3/uL — ABNORMAL LOW (ref 150–400)
Platelets: 154 10*3/uL (ref 150–400)
Platelets: 156 10*3/uL (ref 150–400)
Platelets: 184 10*3/uL (ref 150–400)
RBC: 3.57 MIL/uL — ABNORMAL LOW (ref 3.87–5.11)
RBC: 3.92 MIL/uL (ref 3.87–5.11)
RBC: 3.93 MIL/uL (ref 3.87–5.11)
RBC: 4.12 MIL/uL (ref 3.87–5.11)
RBC: 4.14 MIL/uL (ref 3.87–5.11)
RBC: 4.14 MIL/uL (ref 3.87–5.11)
RBC: 5.11 MIL/uL (ref 3.87–5.11)
RDW: 14 % (ref 11.5–15.5)
RDW: 14.4 % (ref 11.5–15.5)
RDW: 13.9 % (ref 11.5–15.5)
RDW: 13.9 % (ref 11.5–15.5)
RDW: 14 % (ref 11.5–15.5)
RDW: 14 % (ref 11.5–15.5)
RDW: 14.2 % (ref 11.5–15.5)
RDW: 14.3 % (ref 11.5–15.5)
RDW: 14.3 % (ref 11.5–15.5)
WBC: 8.3 10*3/uL (ref 4.0–10.5)
WBC: 8.3 10*3/uL (ref 4.0–10.5)
WBC: 9.8 10*3/uL (ref 4.0–10.5)

## 2010-08-18 LAB — HEMOGLOBIN A1C
Hgb A1c MFr Bld: 11.8 % — ABNORMAL HIGH (ref 4.6–6.1)
Mean Plasma Glucose: 292 mg/dL
Mean Plasma Glucose: 312 mg/dL

## 2010-08-18 LAB — DIFFERENTIAL
Basophils Absolute: 0 10*3/uL (ref 0.0–0.1)
Basophils Absolute: 0 10*3/uL (ref 0.0–0.1)
Basophils Relative: 0 % (ref 0–1)
Basophils Relative: 0 % (ref 0–1)
Eosinophils Absolute: 0.2 10*3/uL (ref 0.0–0.7)
Eosinophils Absolute: 0.1 10*3/uL (ref 0.0–0.7)
Eosinophils Absolute: 0.1 10*3/uL (ref 0.0–0.7)
Eosinophils Absolute: 0.2 10*3/uL (ref 0.0–0.7)
Eosinophils Relative: 1 % (ref 0–5)
Eosinophils Relative: 2 % (ref 0–5)
Lymphocytes Relative: 21 % (ref 12–46)
Lymphocytes Relative: 21 % (ref 12–46)
Lymphs Abs: 1.8 10*3/uL (ref 0.7–4.0)
Lymphs Abs: 1.7 10*3/uL (ref 0.7–4.0)
Lymphs Abs: 1.8 10*3/uL (ref 0.7–4.0)
Monocytes Absolute: 0.7 10*3/uL (ref 0.1–1.0)
Monocytes Absolute: 0.7 10*3/uL (ref 0.1–1.0)
Monocytes Relative: 11 % (ref 3–12)
Monocytes Relative: 10 % (ref 3–12)
Monocytes Relative: 11 % (ref 3–12)
Neutro Abs: 5.2 10*3/uL (ref 1.7–7.7)
Neutro Abs: 5.5 10*3/uL (ref 1.7–7.7)
Neutrophils Relative %: 72 % (ref 43–77)
Neutrophils Relative %: 65 % (ref 43–77)
Neutrophils Relative %: 66 % (ref 43–77)

## 2010-08-18 LAB — VITAMIN B12: Vitamin B-12: 467 pg/mL (ref 211–911)

## 2010-08-18 LAB — TROPONIN I
Troponin I: 0.01 ng/mL (ref 0.00–0.06)
Troponin I: 0.01 ng/mL (ref 0.00–0.06)
Troponin I: 0.02 ng/mL (ref 0.00–0.06)
Troponin I: 0.02 ng/mL (ref 0.00–0.06)
Troponin I: 0.07 ng/mL — ABNORMAL HIGH (ref 0.00–0.06)

## 2010-08-18 LAB — LIPASE, BLOOD: Lipase: 10 U/L — ABNORMAL LOW (ref 11–59)

## 2010-08-18 LAB — CLOSTRIDIUM DIFFICILE EIA: C difficile Toxins A+B, EIA: NEGATIVE

## 2010-08-18 LAB — LIPID PANEL
Cholesterol: 135 mg/dL (ref 0–200)
LDL Cholesterol: 76 mg/dL (ref 0–99)
Total CHOL/HDL Ratio: 3.1 RATIO

## 2010-08-18 LAB — URINE CULTURE: Colony Count: >=100000

## 2010-08-18 LAB — URINALYSIS, ROUTINE W REFLEX MICROSCOPIC
Bilirubin Urine: NEGATIVE
Ketones, ur: NEGATIVE mg/dL
Leukocytes, UA: NEGATIVE
Nitrite: NEGATIVE
Nitrite: POSITIVE — AB
Protein, ur: 100 mg/dL — AB
Protein, ur: NEGATIVE mg/dL
Urobilinogen, UA: 0.2 mg/dL (ref 0.0–1.0)
pH: 5.5 (ref 5.0–8.0)

## 2010-08-18 LAB — IRON AND TIBC
Iron: 91 ug/dL (ref 42–135)
TIBC: 207 ug/dL — ABNORMAL LOW (ref 250–470)

## 2010-08-18 LAB — URINE MICROSCOPIC-ADD ON

## 2010-08-18 LAB — CK TOTAL AND CKMB (NOT AT ARMC): CK, MB: 1.1 ng/mL (ref 0.3–4.0)

## 2010-08-18 LAB — CULTURE, BLOOD (ROUTINE X 2): Culture: NO GROWTH

## 2010-08-18 LAB — RETICULOCYTES
RBC.: 4.41 MIL/uL (ref 3.87–5.11)
Retic Count, Absolute: 35.3 10*3/uL (ref 19.0–186.0)

## 2010-08-18 LAB — PROTIME-INR: Prothrombin Time: 13.4 seconds (ref 11.6–15.2)

## 2010-08-18 LAB — PHOSPHORUS: Phosphorus: 2.3 mg/dL (ref 2.3–4.6)

## 2010-08-19 LAB — POCT I-STAT, CHEM 8
BUN: 36 mg/dL — ABNORMAL HIGH (ref 6–23)
Calcium, Ion: 1.01 mmol/L — ABNORMAL LOW (ref 1.12–1.32)
Chloride: 104 mEq/L (ref 96–112)
Glucose, Bld: 292 mg/dL — ABNORMAL HIGH (ref 70–99)
HCT: 38 % (ref 36.0–46.0)
TCO2: 24 mmol/L (ref 0–100)

## 2010-08-19 LAB — CBC
MCHC: 34.5 g/dL (ref 30.0–36.0)
MCV: 81.3 fL (ref 78.0–100.0)
Platelets: 168 10*3/uL (ref 150–400)
RDW: 13.7 % (ref 11.5–15.5)

## 2010-08-19 LAB — URINALYSIS, ROUTINE W REFLEX MICROSCOPIC
Bilirubin Urine: NEGATIVE
Nitrite: NEGATIVE
Protein, ur: 100 mg/dL — AB
Specific Gravity, Urine: 1.025 (ref 1.005–1.030)
Urobilinogen, UA: 0.2 mg/dL (ref 0.0–1.0)

## 2010-08-19 LAB — URINE MICROSCOPIC-ADD ON

## 2010-08-19 LAB — DIFFERENTIAL
Basophils Absolute: 0 10*3/uL (ref 0.0–0.1)
Basophils Relative: 0 % (ref 0–1)
Eosinophils Absolute: 0 10*3/uL (ref 0.0–0.7)
Neutro Abs: 7.5 10*3/uL (ref 1.7–7.7)
Neutrophils Relative %: 73 % (ref 43–77)

## 2010-08-19 LAB — URINE CULTURE: Colony Count: >=100000

## 2010-08-19 LAB — GLUCOSE, CAPILLARY
Glucose-Capillary: 167 mg/dL — ABNORMAL HIGH (ref 70–99)
Glucose-Capillary: 190 mg/dL — ABNORMAL HIGH (ref 70–99)
Glucose-Capillary: 284 mg/dL — ABNORMAL HIGH (ref 70–99)
Glucose-Capillary: 305 mg/dL — ABNORMAL HIGH (ref 70–99)

## 2010-08-19 LAB — POCT I-STAT 3, VENOUS BLOOD GAS (G3P V)
Acid-Base Excess: 5 mmol/L — ABNORMAL HIGH (ref 0.0–2.0)
Bicarbonate: 25.8 mEq/L — ABNORMAL HIGH (ref 20.0–24.0)
TCO2: 27 mmol/L (ref 0–100)
pH, Ven: 7.61 (ref 7.250–7.300)

## 2010-08-19 LAB — MONONUCLEOSIS SCREEN: Mono Screen: NEGATIVE

## 2010-08-20 LAB — POCT I-STAT, CHEM 8
BUN: 15 mg/dL (ref 6–23)
Chloride: 105 mEq/L (ref 96–112)
Creatinine, Ser: 0.9 mg/dL (ref 0.4–1.2)
Glucose, Bld: 149 mg/dL — ABNORMAL HIGH (ref 70–99)
HCT: 38 % (ref 36.0–46.0)
Potassium: 3.7 mEq/L (ref 3.5–5.1)

## 2010-08-21 IMAGING — CR DG CHEST 1V PORT
1 series · 1 of 1 positions shown · non-contrast
Comparison: 02/19/2008

CLINICAL DATA: Leg pain and swelling diabetic.  Hypertension.

PORTABLE CHEST - 1 VIEW at 5714 hours:

[view not recorded]
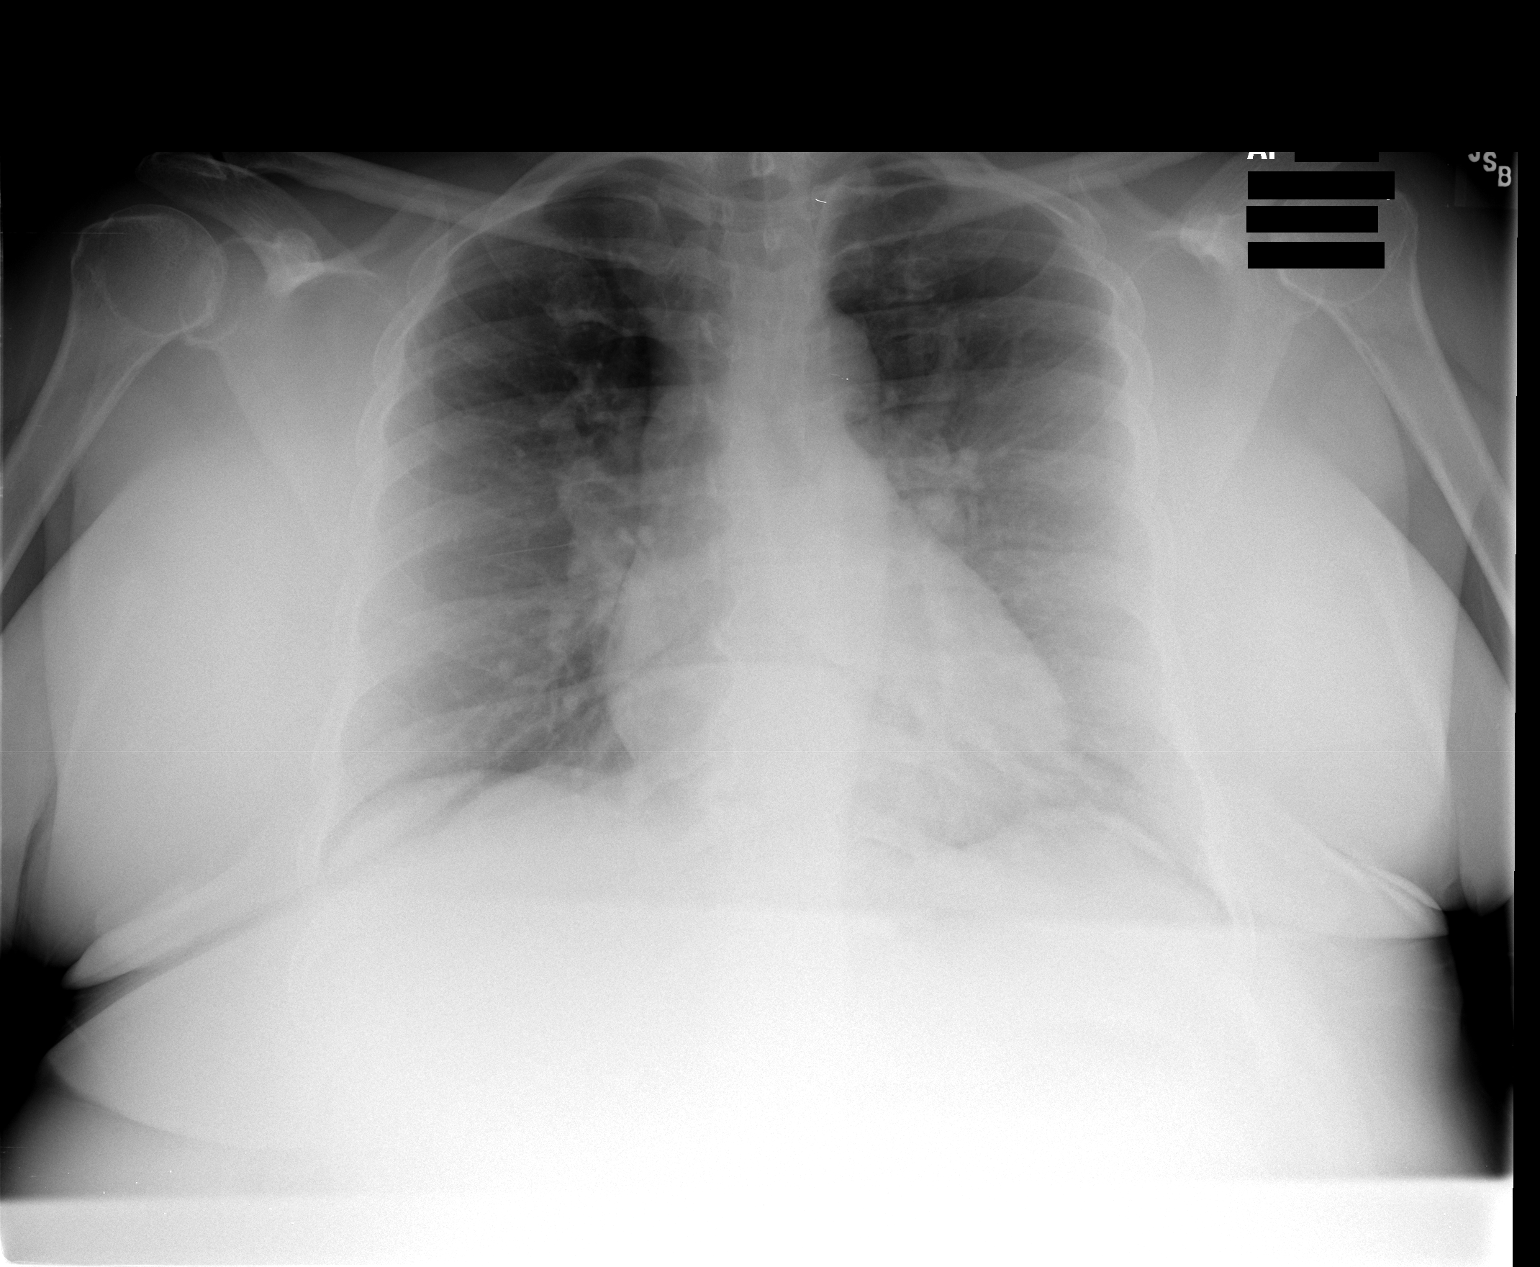

[1 of 1 positions shown; findings below may reference images not displayed]

FINDINGS: Prominent caliber of the pulmonary vessels.  No pulmonary
edema or infiltrate.  Cardiac size upper normal
IMPRESSION: Pulmonary vascular congestion.

## 2010-08-22 LAB — POCT I-STAT, CHEM 8
BUN: 26 mg/dL — ABNORMAL HIGH (ref 6–23)
Creatinine, Ser: 1.1 mg/dL (ref 0.4–1.2)
Glucose, Bld: 507 mg/dL (ref 70–99)
Sodium: 136 mEq/L (ref 135–145)
TCO2: 26 mmol/L (ref 0–100)

## 2010-08-22 LAB — POCT URINALYSIS DIP (DEVICE)
Ketones, ur: NEGATIVE mg/dL
Protein, ur: NEGATIVE mg/dL
Specific Gravity, Urine: <=1.005 (ref 1.005–1.030)

## 2010-08-23 LAB — COMPREHENSIVE METABOLIC PANEL
BUN: 22 mg/dL (ref 6–23)
CO2: 29 mEq/L (ref 19–32)
Chloride: 105 mEq/L (ref 96–112)
Creatinine, Ser: 0.8 mg/dL (ref 0.4–1.2)
GFR calc non Af Amer: >60 mL/min (ref >60)
Total Bilirubin: 0.3 mg/dL (ref 0.3–1.2)

## 2010-08-23 LAB — CBC
HCT: 37.4 % (ref 36.0–46.0)
MCV: 79.7 fL (ref 78.0–100.0)
RBC: 4.7 MIL/uL (ref 3.87–5.11)
WBC: 8.5 10*3/uL (ref 4.0–10.5)

## 2010-08-23 LAB — DIFFERENTIAL
Basophils Absolute: 0 10*3/uL (ref 0.0–0.1)
Lymphocytes Relative: 29 % (ref 12–46)
Neutro Abs: 4.9 10*3/uL (ref 1.7–7.7)
Neutrophils Relative %: 58 % (ref 43–77)

## 2010-08-23 LAB — POCT CARDIAC MARKERS
CKMB, poc: 1.3 ng/mL (ref 1.0–8.0)
Troponin i, poc: <0.05 ng/mL (ref 0.00–0.09)

## 2010-08-25 LAB — CBC
MCHC: 33.7 g/dL (ref 30.0–36.0)
Platelets: 252 10*3/uL (ref 150–400)
RDW: 14.2 % (ref 11.5–15.5)

## 2010-08-25 LAB — DIFFERENTIAL
Basophils Absolute: 0.1 10*3/uL (ref 0.0–0.1)
Basophils Relative: 1 % (ref 0–1)
Lymphocytes Relative: 21 % (ref 12–46)
Neutro Abs: 6.2 10*3/uL (ref 1.7–7.7)
Neutrophils Relative %: 69 % (ref 43–77)

## 2010-08-25 LAB — COMPREHENSIVE METABOLIC PANEL
ALT: 15 U/L (ref 0–35)
AST: 14 U/L (ref 0–37)
CO2: 31 mEq/L (ref 19–32)
Calcium: 10 mg/dL (ref 8.4–10.5)
Chloride: 105 mEq/L (ref 96–112)
GFR calc Af Amer: >60 mL/min (ref >60)
GFR calc non Af Amer: >60 mL/min (ref >60)
Glucose, Bld: 54 mg/dL — ABNORMAL LOW (ref 70–99)
Sodium: 143 mEq/L (ref 135–145)
Total Bilirubin: 0.4 mg/dL (ref 0.3–1.2)

## 2010-08-25 LAB — POCT I-STAT, CHEM 8
BUN: 25 mg/dL — ABNORMAL HIGH (ref 6–23)
Chloride: 105 mEq/L (ref 96–112)
Glucose, Bld: 214 mg/dL — ABNORMAL HIGH (ref 70–99)
HCT: 41 % (ref 36.0–46.0)
Potassium: 4.1 mEq/L (ref 3.5–5.1)

## 2010-08-25 LAB — HEPATIC FUNCTION PANEL
ALT: 18 U/L (ref 0–35)
AST: 13 U/L (ref 0–37)
Albumin: 3.2 g/dL — ABNORMAL LOW (ref 3.5–5.2)
Bilirubin, Direct: <0.1 mg/dL (ref 0.0–0.3)
Total Protein: 6.9 g/dL (ref 6.0–8.3)

## 2010-08-25 LAB — GLUCOSE, CAPILLARY: Glucose-Capillary: 88 mg/dL (ref 70–99)

## 2010-08-26 LAB — DIFFERENTIAL
Lymphocytes Relative: 21 % (ref 12–46)
Monocytes Absolute: 0.5 10*3/uL (ref 0.1–1.0)
Monocytes Relative: 5 % (ref 3–12)
Neutro Abs: 7.3 10*3/uL (ref 1.7–7.7)

## 2010-08-26 LAB — BASIC METABOLIC PANEL
CO2: 30 mEq/L (ref 19–32)
Calcium: 9.2 mg/dL (ref 8.4–10.5)
GFR calc Af Amer: >60 mL/min (ref >60)
GFR calc non Af Amer: >60 mL/min (ref >60)
Potassium: 3.6 mEq/L (ref 3.5–5.1)
Sodium: 144 mEq/L (ref 135–145)

## 2010-08-26 LAB — GLUCOSE, CAPILLARY: Glucose-Capillary: 255 mg/dL — ABNORMAL HIGH (ref 70–99)

## 2010-08-26 LAB — CBC
HCT: 37 % (ref 36.0–46.0)
Hemoglobin: 12.7 g/dL (ref 12.0–15.0)
RBC: 4.59 MIL/uL (ref 3.87–5.11)

## 2010-08-26 LAB — POCT URINALYSIS DIP (DEVICE)
Ketones, ur: NEGATIVE mg/dL
Protein, ur: 100 mg/dL — AB
Specific Gravity, Urine: 1.02 (ref 1.005–1.030)

## 2010-08-26 LAB — POCT CARDIAC MARKERS
CKMB, poc: 2.3 ng/mL (ref 1.0–8.0)
Troponin i, poc: <0.05 ng/mL (ref 0.00–0.09)

## 2010-08-30 LAB — DIFFERENTIAL
Basophils Relative: 0 % (ref 0–1)
Eosinophils Absolute: 0.1 10*3/uL (ref 0.0–0.7)
Monocytes Relative: 8 % (ref 3–12)
Neutrophils Relative %: 65 % (ref 43–77)

## 2010-08-30 LAB — CBC
Hemoglobin: 12.8 g/dL (ref 12.0–15.0)
RBC: 4.75 MIL/uL (ref 3.87–5.11)
WBC: 7.8 10*3/uL (ref 4.0–10.5)

## 2010-08-30 LAB — GLUCOSE, CAPILLARY
Glucose-Capillary: 155 mg/dL — ABNORMAL HIGH (ref 70–99)
Glucose-Capillary: 180 mg/dL — ABNORMAL HIGH (ref 70–99)
Glucose-Capillary: 76 mg/dL (ref 70–99)

## 2010-08-30 LAB — COMPREHENSIVE METABOLIC PANEL
ALT: 16 U/L (ref 0–35)
Alkaline Phosphatase: 96 U/L (ref 39–117)
CO2: 29 mEq/L (ref 19–32)
Chloride: 102 mEq/L (ref 96–112)
GFR calc non Af Amer: >60 mL/min (ref >60)
Glucose, Bld: 352 mg/dL — ABNORMAL HIGH (ref 70–99)
Potassium: 3.7 mEq/L (ref 3.5–5.1)
Sodium: 140 mEq/L (ref 135–145)
Total Protein: 6.6 g/dL (ref 6.0–8.3)

## 2010-08-30 LAB — PROTIME-INR: INR: 1 (ref 0.00–1.49)

## 2010-09-15 ENCOUNTER — Emergency Department (HOSPITAL_COMMUNITY): Payer: PRIVATE HEALTH INSURANCE

## 2010-09-15 ENCOUNTER — Emergency Department (HOSPITAL_COMMUNITY)
Admission: EM | Admit: 2010-09-15 | Discharge: 2010-09-15 | Disposition: A | Discharge status: Home / Self Care | Payer: PRIVATE HEALTH INSURANCE | Attending: Emergency Medicine | Admitting: Emergency Medicine

## 2010-09-15 DIAGNOSIS — R1110 Vomiting, unspecified: Secondary | ICD-10-CM | POA: Insufficient documentation

## 2010-09-15 DIAGNOSIS — R197 Diarrhea, unspecified: Secondary | ICD-10-CM | POA: Insufficient documentation

## 2010-09-15 DIAGNOSIS — E039 Hypothyroidism, unspecified: Secondary | ICD-10-CM | POA: Insufficient documentation

## 2010-09-15 DIAGNOSIS — J45909 Unspecified asthma, uncomplicated: Secondary | ICD-10-CM | POA: Insufficient documentation

## 2010-09-15 DIAGNOSIS — G8929 Other chronic pain: Secondary | ICD-10-CM | POA: Insufficient documentation

## 2010-09-15 DIAGNOSIS — Z79899 Other long term (current) drug therapy: Secondary | ICD-10-CM | POA: Insufficient documentation

## 2010-09-15 DIAGNOSIS — E1169 Type 2 diabetes mellitus with other specified complication: Secondary | ICD-10-CM | POA: Insufficient documentation

## 2010-09-15 DIAGNOSIS — K5289 Other specified noninfective gastroenteritis and colitis: Secondary | ICD-10-CM | POA: Insufficient documentation

## 2010-09-15 LAB — DIFFERENTIAL

## 2010-09-15 LAB — BASIC METABOLIC PANEL
CO2: 31 mEq/L (ref 19–32)
Calcium: 9.4 mg/dL (ref 8.4–10.5)
Chloride: 104 mEq/L (ref 96–112)
Creatinine, Ser: 0.82 mg/dL (ref 0.4–1.2)
GFR calc Af Amer: >60 mL/min (ref >60)
Glucose, Bld: 244 mg/dL — ABNORMAL HIGH (ref 70–99)

## 2010-09-15 LAB — GLUCOSE, CAPILLARY: Glucose-Capillary: 273 mg/dL — ABNORMAL HIGH (ref 70–99)

## 2010-09-15 LAB — CBC
HCT: 44.4 % (ref 36.0–46.0)
Hemoglobin: 15.1 g/dL — ABNORMAL HIGH (ref 12.0–15.0)
MCH: 26.4 pg (ref 26.0–34.0)
MCHC: 34 g/dL (ref 30.0–36.0)
MCV: 77.6 fL — ABNORMAL LOW (ref 78.0–100.0)
RBC: 5.72 MIL/uL — ABNORMAL HIGH (ref 3.87–5.11)

## 2010-09-28 NOTE — Discharge Summary (Signed)
NAME:  Amy [name], Amy [name]                ACCOUNT NO.:  [id number]   MEDICAL RECORD NO.:  [id number]          PATIENT TYPE:  INP   LOCATION:  [id number]                         FACILITY:  [facility]   PHYSICIAN:  Annita Brod, M.D.DATE OF BIRTH:  Sep 20, 1960   DATE OF ADMISSION:  12/05/2007  DATE OF DISCHARGE:  12/07/2007                               DISCHARGE SUMMARY   PRIMARY CARE PHYSICIAN:  Vikki Ports, M.D.   DISCHARGE DIAGNOSES:  1. Diabetic gastroparesis.  2. Nausea and vomiting secondary to #1.  3. Diabetes mellitus type 1 uncontrolled.  4. Hyperglycemia.  5. History of hypothyroidism with suspected subacute hypothyroidism      ongoing.  6. Abnormal findings in abdominal area.  7. Protein calorie malnutrition.  8. History of asthma, stable.  9. History of secondary diabetic neuropathy and retinopathy.  10.Obesity.   DISCHARGE MEDICATIONS:  The patient will continue all of her previous  medications.  These are as follows:  1. Lantus 40 units subcu daily at bedtime.  2. Synthroid 175 mcg p.o. daily.  3. Cymbalta 60 p.o. daily.  4. Altace 2.5 p.o. daily.  5. KCl 20 mEq p.o. p.r.n.  6. Albuterol MDI p.r.n.  7. Aspirin 325 mg p.o. daily.  8. Lasix 40 p.o. daily.  9. NovoLog 20 units b.i.d.  10.New medications for this patient, Reglan 10 mg p.o. t.i.d. with      meals and daily at bedtime.  11.Also, Phenergan 12.5 p.o. q.8 hours p.r.n. total #10.  The patient      is advised not to drive for 12 hours after taking dose of      Phenergan.   HOSPITAL COURSE:  The patient is a 50 year old African American female  with past medical history of poorly controlled brittle diabetic type 1  with episodes of hypo and hyperglycemia who presented to the emergency  room complaining of abdominal pain and several hours of nausea and  vomiting.  When she came into the emergency room she was noted to have a  white count of 11.1 and a lipase level of 13.  The patient's liver  function tests were  unremarkable.  A CT abdomen and pelvis showed no  signs of any type of bowel obstruction although did note an incidental  small metallic object which looked to be nonobstructing in the abdomen.  The patient was admitted.  It was suspected that she had diabetic  gastroparesis.  In regards to diabetic gastroparesis a hemoglobin A1c  was done which showed an A1c of 11.1 with an average blood sugar of 383.  Because of the patient's severe brittle diabetes history and the plan  that she would be establishing with a new endocrinologist in late August  I will make no adjustments to her diabetes medications.  The patient was  started on IV Reglan and by July 24 she was tolerating clear liquids  well.  She is feeling much more comfortable and the plan will be to  advance her diet to a carb modified solid diet.  If she tolerates this  she will be discharged to home.  In  regards to her abnormal findings of  a small metal piece in the CT of her abdomen the followup abdominal x-  rays were done which showed no evidence of metal piece on July 24.  In  regards to the patient's hypothyroidism lab work was checked and she was  found to have an elevated TSH of 7.89, however, followup free T4 was  normal.  We suspect that the patient may have some subacute  hypothyroidism.  However, she also could have mild increase in her TSH  secondary to stress from her diabetic gastroparesis.  Will defer to Dr.  Tomi Bamberger with plans to possibly check her TSH in 1 week's time once her  gastroparesis has subsided and possibly slightly increase her Synthroid  to perhaps 200 mcg but again will defer to Dr. Tomi Bamberger and the patient's  eventually new endocrinologist.  The patient's other medical issues were  stable during this hospitalization.  She had a brief elevation, she had  a mild increase in her troponin on second set markers of 0.08.  Cardiac  markers were checked because the patient is a diabetic and complaining  of nausea  and vomiting and we did not want this to be an atypical  presentation of MI.  However, subsequent cardiac markers were checked  and these were all normal.  However, the patient was placed on telemetry  during her hospitalization and this was stable.  The patient's other  medical issues were stable during this hospitalization and she is being  discharged to home.  Her discharge diet will be a carb modified diet.  She will follow up with Dr. Tomi Bamberger sometime next week.  The patient is  being discharged to home.   DISPOSITION:  Improved.   ACTIVITY:  Slowly increase.      Annita Brod, M.D.  Electronically Signed     SKK/MEDQ  D:  12/07/2007  T:  12/07/2007  Job:  [id number]   cc:   Vikki Ports, M.D.  Fax: [phone]

## 2010-09-28 NOTE — H&P (Signed)
NAME:  Amy [name], Amy [name] NO.:  [id number]   MEDICAL RECORD NO.:  [id number]          PATIENT TYPE:  INP   LOCATION:  48                         [facility]ACILITY:  [facility]   PHYSICIAN:  Irine Seal, MD    DATE OF BIRTH:  August 28, 1960   DATE OF ADMISSION:  12/05/2007  DATE OF DISCHARGE:                              HISTORY & PHYSICAL   PRIMARY CARE PHYSICIAN:  Dr. Vikki Ports, M.D.   HISTORY OF PRESENT ILLNESS:  Amy [name] is a 50 year old African  American female with type 1 brittle diabetes with retinopathy and  neuropathy, history of hypothyroidism, asthma and depression, who  presents to the ED with a several hour history of nausea, intractable  bilious emesis, diffuse abdominal pain which is constant in nature,  10/10, sharp with no radiation.  Also endorses some chills and decreased  p.o. intake.  The patient denied any chest pain or shortness of breath.  No cough, no diarrhea, no fevers, no melena, no hematochezia, no  hematemesis, no constipation, no facial asymmetry, no asymmetric  weakness, no slurred speech, no associated neurological symptoms.  The  patient states she has not had a bowel movement in 2 days.  The patient  also states that she had some people at her job with similar symptoms.  The patient was seen in the ED.  Labs obtained showed a white count of  11.1, hemoglobin 13.8, platelets 235, hematocrit 41.3, ANC of 9.0,  lipase of 13, sodium 142, potassium 4.0, chloride 105, bicarb 29, BUN  13, creatinine 0.56, glucose 130, albumin 3.3.  LFTs within normal  limits.  Point of care cardiac markers were negative.  EKG with normal  sinus rhythm.  UA was negative for any urinary tract infection.  CT of  the abdomen and pelvis with no acute findings.  The patient with her  intractable nausea and emesis was given some Zofran in the ED, with no  improvement.  She was also given some morphine and still complained of  abdominal pain, then given some Dilaudid 2  mg IV push, which lead to  have some unresponsiveness.  The patient was given Narcan, and responded  back immediately.  We were called to admit the patient for further  evaluation and recommendations.   ALLERGIES:  PENICILLIN CAUSES LOCALIZED SWELLING.   PAST MEDICAL HISTORY:  1. Brittle type 1 diabetes x19-20 years with episodes of severe      hypoglycemia and hyperglycemia.  2. Hypothyroidism.  3. Asthma.  4. Neuropathy.  5. Questionable history of hypertension.  6. Incidental hemangioma.  7. Left eye blindness secondary to diabetic retinopathy.  8. History of bilateral cataracts.  9. Status post bilateral retinal detachment.  10.Obesity.  11.Chronic bronchitis.  12.History of fibroids, status post hysterectomy.  13.Pelvic adhesions.  14.C-section x2.  15.Bilateral tubal ligation.  16.Depression.  17.Gastroesophageal reflux disease.  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   MEDICATIONS:  1. NovoLog 20 units q.a.m. and nightly.  2. Lantus 40 units nightly.  3. Lasix 40 mg as needed.  4. Aspirin 324 mg daily.  5. Albuterol MDIs as  needed.  6. Potassium chloride 20 mEq as needed.  7. Altace 2.5 mg daily.  8. Cymbalta 60 mg daily.  9. Synthroid 175 mcg daily.   SOCIAL HISTORY:  The patient lives in [place].  Occasionally, her son  lives with her.  A prior history of tobacco use, quit 12 years ago.  No  alcohol use.  No IV drug use.  The patient is currently employed, has  two sons, the 27 year old is diabetic.  A 56 year old son whose health  history is unknown.   FAMILY HISTORY:  Father unknown and deceased.  Mother deceased at age  17, cause is unknown.  One sister age 86 with diabetes and hypertension.   REVIEW OF SYSTEMS:  As per HPI, otherwise negative.   PHYSICAL EXAMINATION:  VITAL SIGNS:  Temperature 98.0, blood pressure  126/78, pulse 79, respiratory rate 18, satting 99% on room air.  GENERAL:  Patient in mild discomfort and vomiting throughout the   interview.  HEENT:  Normocephalic, atraumatic.  Right eye; pupil equal, round and  reactive to light and accommodation.  Left eye blindness.  Extraocular  movements intact.  Oropharynx is clear.  No lesions, no exudates.  NECK:  Supple.  No lymphadenopathy.  RESPIRATORY:  Lungs are clear to auscultation bilaterally.  No wheezes,  no crackles.  CARDIOVASCULAR:  Regular rate and rhythm.  No murmurs,  rubs or gallops.  ABDOMEN:  Soft, diffuse tenderness to palpation.  Positive bowel sounds.  No rebound, no guarding.  EXTREMITIES:  No clubbing, cyanosis or edema.  NEUROLOGICAL:  The patient is alert and oriented x3.  Cranial nerves II-  XII grossly intact.  No focal deficits.   LABORATORY DATA:  CBC white count 11.1, hemoglobin 13.8, platelets 235,  hematocrit 41.3, ANC of 9.0, lipase 13.  Sodium 142, potassium 4.0,  chloride 105, bicarb 29, BUN 13, creatinine 0.56, glucose of 130,  bilirubin 0.6, alkaline phosphatase 112, AST 14, ALT 12, protein 7.2,  calcium of 9.9 and albumin of 3.3.  Point of care cardiac markers; CK-MB  2.0, myoglobin 140, troponin less than 0.05.  UA was yellow, clear,  specific gravity 1.015, pH of 7, glucose 250, bilirubin negative, ketone  negative, trace protein 100, urobilinogen 0.2, nitrite negative,  leukocytes negative, few squamous epithelial cells, many bacteria.  CT  of the abdomen and pelvis with no acute abdominal findings, masses,  lesions or adenopathy.  No acute pelvic findings, masses or adenopathy.  EKG normal sinus rhythm.  No ST-T wave abnormalities.   ASSESSMENT AND PLAN:  [name] is a 50 year old female with  brittle type 1 diabetes with retinopathy and neuropathy, history of  hypothyroidism, depression and asthma, who presents to the emergency  department with a several hour history of intractable nausea, vomiting  and diffuse abdominal pain.   1. Nausea, intractable emesis and abdominal pain, likely secondary to      gastroparesis  in a patient with poorly controlled type 1 diabetes      with retinopathy and neuropathy versus acute coronary syndrome      versus a viral enteritis versus pancreatitis which is unlikely with      a negative lipase and negative CT of the abdomen and pelvic      findings versus small-bowel obstruction which is unlikely with a      negative CT.  Will admit the patient to telemetry, cycle cardiac      enzymes q.8 h., x3.  Check thyroid-stimulating hormone.  Check  magnesium.  Check a chest x-ray, check a hemoglobin A1c, check      amylase, check a urine pregnancy test.  Will make the patient      n.p.o., place on IV fluids, Reglan 10 mg IV q.8 h.  Will check a      gastric emptying study and supportive care.  2. Poorly controlled type 1 diabetes.  Check a hemoglobin A1c.  Place      on half to home dose of Lantus and sliding scale insulin.  3. Hypothyroidism.  Check a thyroid-stimulating hormone, Synthroid.  4. Asthma.  Albuterol nebs as needed.  5. Depression. Cymbalta.  6. Gastroesophageal reflux disease.  Protonix.  7. Prophylaxis.  Protonix for gastrointestinal prophylaxis.  Heparin      for deep venous thrombosis prophylaxis.   It has been a pleasure taking care of [name].      Irine Seal, MD  Electronically Signed     DT/MEDQ  D:  12/05/2007  T:  12/06/2007  Job:  [id number]   cc:   Vikki Ports, M.D.  Fax: [phone]

## 2010-09-28 NOTE — Op Note (Signed)
NAME:  Amy [name], Amy [name]                ACCOUNT NO.:  [id number]   MEDICAL RECORD NO.:  [id number]          PATIENT TYPE:  AMB   LOCATION:  DAY                          FACILITY:  [facility]   PHYSICIAN:  Odis Hollingshead, M.D.DATE OF BIRTH:  October 31, 1960   DATE OF PROCEDURE:  06/13/2008  DATE OF DISCHARGE:                               OPERATIVE REPORT   PREOPERATIVE DIAGNOSIS:  Symptomatic cholelithiasis.   POSTOPERATIVE DIAGNOSIS:  Symptomatic cholelithiasis.   PROCEDURE:  Laparoscopic cholecystectomy with intraoperative  cholangiogram   SURGEON:  Odis Hollingshead, M.D.   ASSISTANT:  Orson Ape. Rise Patience, M.D.   ANESTHESIA:  General.   INDICATION:  Amy [name] is a 50 year old female insulin-dependent  diabetic whose has been having right upper quadrant discomfort  exacerbated by a fatty meal.  She has a known history of gastroparesis  and intermittent nausea and vomiting.  However, these symptoms are much  different than that.  She had a gallbladder ultrasound demonstrating  small gallstones versus gallbladder polyps.  She has a family history of  gallbladder disease.  She now presents for elective cholecystectomy.  The procedure risks and recommendations for aftercare were discussed  with her preoperatively.   TECHNIQUE:  She was brought to the operative room, placed supine on the  operating table and a general anesthetic was administered.  Her  abdominal wall was sterilely prepped and draped.  Marcaine was  infiltrated in the supraumbilical region.  A transverse supraumbilical  incision was made through the skin and subcutaneous tissue until the  midline fascia was exposed.  A small incision was made in the midline  fascia and peritoneum, entering the peritoneal cavity under direct  vision.  A pursestring suture of zero Vicryl was placed around the  fascial edges.  A Hasson trocar was introduced to the peritoneal cavity  and pneumoperitoneum created by insufflation of CO2  gas.   Following this the laparoscope was introduced into the abdominal cavity  and she was placed in the reverse Trendelenburg position with her right  side tilted slightly up.  An 11 mm trocar was placed in the epigastric  incision and two 5 mm trocars were placed in the right upper quadrant.  The gallbladder was identified and was not acutely inflamed had it no  significant omental adhesions to it.  The fundus of the gallbladder was  grasped and retracted toward the right shoulder.  The infundibulum was  grasped and with careful blunt dissection on the gallbladder the  infundibulum was mobilized.  I then identified the cystic duct.  I also  identified the anterior branch of the cystic artery which was directly  anterior to the cystic duct.  I created a window around the anterior  branch of the cystic artery, clipped it and divided it, improving the  exposure of the cystic duct.  I then used blunt dissection to create a  window around the cystic duct and obtained the critical view.  A clip  was placed in the cystic duct gallbladder junction and a small incision  made in the cystic duct and bile milked back from it.  The  cholangiocatheter was passed into the cystic duct and a cholangiogram  was performed.   Under real time fluoroscopy dilute contrast was injected into the cystic  duct.  The cystic duct appeared to be emanating from the right hepatic  duct as soon as the injection occurred.  There was a small area of  cystic duct and the right hepatic duct filled and  subsequently filled  the common bile duct, the hepatic duct and the left hepatic duct.  Contrast drained to the duodenum without evidence of hesitation.  I had  the radiologist come view the films as well and he agreed that it  appeared that the cystic duct was emanating off the right hepatic duct.   Following this the cholangiocatheter was removed, the cystic duct was  then clipped twice on the biliary side and divided  close to the  gallbladder.  I then  used blunt dissection to identify a posterior  branch of the cystic artery close to the gallbladder.  It was clipped  and divided.  The gallbladder was then dissected free from the liver  using electrocautery.  There were a few puncture wounds made in the  gallbladder and some spillage of bile which was evacuated.  The  gallbladder was then placed in an Endopouch bag.   I then copiously irrigated out the gallbladder fossa and controlled  bleeding with electrocautery.  Once hemostasis was adequate, I irrigated  performed more irrigation and noted there is no further bleeding.  There  is no bile b.i.d. leak.  The irrigation fluid was evacuated as much as  possible.   I then removed the gallbladder in the Endopouch bag through the  subumbilical port and sent it to pathology.  The subumbilical fascial  defect was closed under laparoscopic vision by tightening up and tying  down the pursestring suture.  The remaining trocars were removed and  pneumoperitoneum was released.   All skin incisions were closed with 4-0 Monocryl subcuticular stitches,  followed by Steri-Strips and sterile dressings.  She tolerated the  procedure without any apparent complications and was taken to the  recovery room in satisfactory condition.      Odis Hollingshead, M.D.  Electronically Signed     TJR/MEDQ  D:  06/13/2008  T:  06/13/2008  Job:  [id number]   cc:   Vikki Ports, M.D.  Fax: [phone]

## 2010-10-01 NOTE — Discharge Summary (Signed)
NAMEAKEYLAH, LEMOND                ACCOUNT NO.:  [id number]   MEDICAL RECORD NO.:  [id number]          PATIENT TYPE:  INP   LOCATION:  [id number]                         FACILITY:  [facility]   PHYSICIAN:  Annita Brod, M.D.DATE OF BIRTH:  1961-02-04   DATE OF ADMISSION:  07/02/2006  DATE OF DISCHARGE:  07/09/2006                               DISCHARGE SUMMARY   DISCHARGE DIAGNOSES:  1. Diabetes mellitus type 1, extremely brittle diabetic with episodes      of severe hypoglycemia and hyperglycemia.  2. Nausea, vomiting, abdominal pain secondary to viral      gastroenteritis, now resolved.  3. Hypothyroidism.  4. History of asthma.  5. Secondary neuropathy.  6. Hypertension.  7. Liver lesion found to be incidental hemangioma.  I have recommended      followup CT scan in 6 months.   HOSPITAL COURSE:  The patient is a 50 year old, African-American female  with a past medical history of diabetes difficult to control with  episodes of hyperglycemia and hypoglycemia who presented with nausea,  vomiting, unable to keep much down.  CT scan that was done was  essentially unremarkable other than noticing a right liver lobe lesion.  The rest of it showed no signs of any obstruction and was felt to be a  viral gastroenteritis.  She was treated empirically for several days  with antibiotics, antiemetics, and she did well.  In regards to the  liver lesion, a dedicated liver ultrasound was done showing incidental  hemangioma but stable.  The patient will be recommended a CT scan in 6  months.  The patient continues to remain a very difficult diabetic for  several days after admission despite.  Even a sensitive sliding scale no  basal insulin, she had episodes of hypoglycemia sooner requiring D5.  By  February 22, her sugars have started to elevate up in the 200's and  300's.  Her D5 was discontinued.  Her sugars continued to elevate.  She  was put back on Lantus and sliding scale; however, with  aggressive  sliding scale, she started having episodes of hypoglycemia.  Lantus was  continued at a much reduced dose and her sugars have improved.   Plan at this time is to discharge the patient on Lantus, reduced dose,  at 12 units subcutaneous nightly.  Will follow up with her  endocrinologist, Dr. Chalmers Cater, as well as her PCP, Dr. Tomi Bamberger in the next  week.  The rest of her medical issues were unremarkable.   DISCHARGE DIET:  Carbohydrate-modified, low-sodium diet.   ACTIVITY:  Will be as tolerated.  She was evaluated by PT who did not  have any further recommendations.   DISPOSITION:  Improved.  She is being discharged to home.   DISCHARGE MEDICATIONS:  1. Altace 2.5.  2. Aspirin 325.  3. Calcium 600 D daily.  4. Clarinex 5.  5. Lasix 40 daily.  6. She previously was on Lantus 70 units in the morning and 50 units      in the evening.  I am cutting her down to  Lantus 12 units subcutaneous nightly.  7. She has bee discharged on KCl, Singulair, Synthroid, Cymbalta, and      she will continue on her NovoLog b.i.d., although this, again, may      need to be reevaluated.      Annita Brod, M.D.  Electronically Signed     SKK/MEDQ  D:  07/09/2006  T:  07/09/2006  Job:  [id number]   cc:   Vikki Ports, M.D.  Fax: [phone]   Jacelyn Pi, M.D.  Fax: [phone]

## 2010-10-01 NOTE — H&P (Signed)
NAME:  Amy [name], BURGO NO.:  [id number]   MEDICAL RECORD NO.:  [id number]          PATIENT TYPE:  EMS   LOCATION:  MAJO                         FACILITY:  [facility]   PHYSICIAN:  Ashby Dawes. Polite, M.D. DATE OF BIRTH:  1960-09-06   DATE OF ADMISSION:  01/01/2005  DATE OF DISCHARGE:                                HISTORY & PHYSICAL   CHIEF COMPLAINT:  Abdominal pain.   HISTORY OF PRESENT ILLNESS:  A 50 year old female with multiple medical  problems presents to the ED with complaint of not feeling well.  Note, the  patient was recently hospitalized, in May 2006, for hyperglycemia and at  that time found to have a UTI.  This time, the patient presents to the ED  with a complaint of not feeling well, found to have hyperglycemia.  B-MET,  however, without acidosis and without azotemia.  UA fairly normal, no  bacteria.  Chest x-ray no apparent disease.  According to the patient,  states that she was essentially without food for a week, therefore, she did  not take her insulin because of that presented with above complaint.  The  patient denies any fever, chills, no nausea, no vomiting, but does admit to  some vague abdominal discomfort.  Denies any productive cough.  Denies any  dysuria.  Does admit to polyuria and polydipsia.  As stated, the patient has  been evaluated in the ED and found to require admission for the above  complaint.   PAST MEDICAL HISTORY:  1.  Diabetes, insulin-dependent with associated retinopathy and neuropathy.  2.  Hypertension.  3.  Hypothyroidism.  Labs from last admission showed an elevated TSH.  4.  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  5.  Neuropathy.  6.  Morbid obesity.  7.  Questionable medication compliance.   MEDICATIONS:  1.  Lantus 70 units daily.  2.  NovoLog 20 units daily.  3.  Patient also admits to taking Metformin.  4.  Synthroid 0.15 mg every day.  5.  Neurontin 300 mg t.i.d.  6.  Amitriptyline 25 mg every day.  7.  Potassium 20 mEq daily.  8.  Cal-Citrate 600 + vitamin D, one b.i.d.  9.  Aspirin 325 mg, one every day.  10. Cymbalta 60 mg every day.  11. Lasix 40 mg every day.  12. Singulair 10 mg daily.   SOCIAL HISTORY:  Negative for tobacco, alcohol, or drugs.   PAST SURGICAL HISTORY:  Hysterectomy, in 2005, secondary to fibroids.  The  patient denies appendectomy or cholecystectomy.   ALLERGIES:  No known drug allergies.   FAMILY HISTORY:  Mother with hypertension and diabetes.  Father medical  history unknown.  One sister with diabetes.   REVIEW OF SYSTEMS:  As stated in HPI, otherwise negative.   PHYSICAL EXAMINATION:  GENERAL:  The patient is somewhat somnolent but  easily aroused, minimal distress secondary to abdominal discomfort.  VITAL SIGNS:  Temp 98.1, BP 136/80, pulse 90, respiratory rate of 20, sat  100%.  HEENT:  Pupils, right reactive, left gray disfigured, questionable cataract.  The patient denies any prior  trauma to that eye.  Dry oral mucosa.  CHEST:  Clear to auscultation bilaterally.  CARDIOVASCULAR:  Regular.  No S3.  ABDOMEN:  Obese.  No mass.  Nontender.  No hepatosplenomegaly.  EXTREMITIES:  No clubbing, cyanosis, or edema.  Pulse 1+.  NEURO:  Significant for neuropathy.   DATA:  Chest x-ray:  No apparent disease.  CBC:  White count 8.7, hemoglobin  12.9, MCV 77.5, platelets 241.  B-MET:  Sodium 134, potassium 4.2, chloride  103, BUN 11, CO2 24, creatinine 0.7.  UA:  Specific gravity 1.016, greater  than 1,000 glucose, 40 ketones, LE and nitrate negative.   ASSESSMENT:  1.  Hyperglycemia on the basis of noncompliance with medications.  2.  Abdominal pain.  No clear etiology at this time rule out secondary to      early diabetic ketoacidosis, versus gastritis, versus an occult process,      i.e., abscess.  3.  Hypertension.  4.  Hypothyroidism.  Please note in March 2006, the patient had an elevated      TSH.  5.  History of asthma.  6.  Legally blind  secondary to diabetic retinopathy.   RECOMMENDATIONS:  1.  The patient be admitted to medicine floor bed.  2.  The patient will be given IV fluids, IV insulin to bring sugars within a      normal range.  Please note as stated, there is no acidosis at this time,      however, the patient will have followup B-MET as the patient is a      diabetic-insulin-dependent.  We will rule out an infectious etiology for      her current situation, however, it seems quite      obvious that the patient essentially just stopped taking her insulin.  3.  As for the patient's abdominal pain, exam is somewhat nonspecific.  We      will start the patient on PPI and follow up clinically and make further      recommendations as deemed necessary.      Ashby Dawes. Polite, M.D.  Electronically Signed     RDP/MEDQ  D:  01/01/2005  T:  01/01/2005  Job:  [id number]   cc:   Vikki Ports, M.D.  [address] [place], [place] [address]  Fax: [phone]   Jacelyn Pi, M.D.  [phone] [address], [place] [address]  Fax: [phone]

## 2010-10-01 NOTE — Op Note (Signed)
[place]  Patient:    Amy [name], Amy [name]                       MRN: [id number] Proc. Date: 03/20/00 Adm. Date:  [id number] Disc. Date: [id number] Attending:  Katy Apo                           Operative Report  PREOPERATIVE DIAGNOSES: 1. Neovascular glaucoma, left eye, progressive despite aggressive    panretinal photocoagulation and previous vitrectomy and Endolaser    cyclophotocoagulation. 2. Retained silicone oil, left eye. 3. Nuclear sclerotic cataract of the left eye.  POSTOPERATIVE DIAGNOSES: 1. Neovascular glaucoma, left eye, progressive despite aggressive    panretinal photocoagulation and previous vitrectomy and Endolaser    cyclophotocoagulation. 2. Retained silicone oil, left eye. 3. Nuclear sclerotic cataract of the left eye. 4. 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  PROCEDURES: 1. Posterior vitrectomy with membrane peel with blood clot loculated over    the macula. 2. Removal of intraocular implant, silicone oil left eye. 3. Pars plana lensectomy, left eye. 4. Insertion of posterior chamber intraocular lens into the sulcus, left eye. 5. Glaucoma seton or aqueous diversion shunt, Baerveldt, through the pars    plana of the left eye.  SURGEON:  Nolon Bussing, M.D.  ANESTHESIA:  General endotracheal anesthesia.  INDICATION FOR PROCEDURE:  The patient is a 50 year old woman with neglected, advanced proliferative diabetic retinopathy with massive vision loss in each eye, massive neovascular tissue, which has progressed with hemorrhagic complications as well as retinal detachment complications despite aggressive diabetic retinopathy management.  She has now developed glaucoma, which is recalcitrant to all maximal medical  therapy.  It is thought that this could either be a synechial angle closure or could become a posterior aqueous diversion syndrome because of the patients phakic status that had been attempted to be maintained while using silicone oil to tamponade the recurrent likely vitreous hemorrhages and neovascular stimulus from her advanced retinopathy and retinal ischemia.  The patient understands this is an attempt, rather heroic, to salvage the globe and salvage useful vision by controlling the intraocular pressure and also to remove the cataract as well as remove the possibility of posterior aqueous diversion by removing the silicone oil implant.  She understands the risks of anesthesia, including the rare possibility of death, but mostly to the eye, including hemorrhage, infection, scarring, need for further surgery, no change in vision, and loss of vision despite intervention.  Appropriate signed consent was obtained.  DESCRIPTION OF PROCEDURE:  Patient taken to the operating room.  In the operating room, general endotracheal anesthesia was induced and the left periocular region sterilely prepped and draped in the usual ophthalmic fashion.  A lid speculum applied.  Conjunctival peritomy was then fashioned from the superonasal quadrant circumferentially to the inferotemporal quadrant.  A 4 mm infusion was secured 3.5 mm posterior to the limbus in the inferotemporal quadrant.  Placement in the vitreous cavity verified visually. At this time, a grooved limbal incision was then fashioned superiorly. Anterior chamber was opened and deepened with Provisc.  Superior sclerotomies were then fashioned.  The viscous fluid remover was then used to remove the silicone oil from the posterior chamber.  At  this time, the posterior capsule was then entered with BSS medium in the vitreous cavity.  BSS was then infused into the capsule for hydrodelineation and delamination.  Endocapsular removal and  phacofragmentation was then carried out.  Cortical cleanup was then carried out in the bag as well.  The anterior capsule was preserved.  At this time, a dense premacular clot was identified and was able to be removed without complication.  Notable findings were extensive macular ischemia. Excellent laser photocoagulation was noted.  The retina was attached.  At this time, the superior rectus and lateral rectus muscles were isolated on 2-0 silk ties.  The Baerveldt implant wings were placed under each of those two rectus muscles and into the superotemporal quadrant.  The model [id number] was used.  Three and a half millimeters posterior to the limbus, the seton tube was placed through the sclerotomy after clearing of the perisclerotomy area of the vitreous in this area for free flow of fluid from the sclerotomy. The tube was placed and secured in place with 8-0 nylon sutures to the sclera. The foot plate was then secured with 8-0 nylon sutures at appropriate tension and appropriate placement.  Excellent thick Tenons was noted in this area, and thus no coverage of the implant was deemed necessary.  Free flow was carried out, was noted through the tube.  A temporary 7-0 Vicryl loop was then applied to the appropriate tension to minimize the flow through the tube.  At this time, the limbal wound was then opened.  A Storz model EZE-60 power plus 20.0, serial number [id number], intraocular lens was placed into the sulcus and rotated to the horizontal position with excellent stability.  A central capsular opening was then fashioned with the vitrectomy.  At this time, fluid _____ was left in the vitreous cavity.  Hemostasis was spontaneous.  The remaining superior sclerotomy was then closed with 7-0 Vicryl suture.  The infusion was removed and closed with 7-0 Vicryl.  The limbal wound previously had been closed with interrupted 9-0 nylon suture, and Provisc remaining had been aspirated from the  vitreous cavity.  At this time, the conjunctiva was then brought forward and closed at the limbus with interrupted 7-0 Vicryl suture.  Subconjunctival injection of antibiotics were applied inferiorly.  The patient tolerated the procedure well  without complication. DD:  06/06/00 TD:  06/07/00 Job: [id number]

## 2010-10-01 NOTE — Discharge Summary (Signed)
NAME:  Amy, [name] NO.:  [id number]   MEDICAL RECORD NO.:  [id number]          PATIENT TYPE:  INP   LOCATION:  5008                         FACILITY:  [facility]   PHYSICIAN:  Nolene Ebbs, M.D.    DATE OF BIRTH:  July 27, 1960   DATE OF ADMISSION:  04/07/2004  DATE OF DISCHARGE:  04/14/2004                                 DISCHARGE SUMMARY   PRESENTATION:  Ms. Firestone was admitted through the emergency room  at [facility] due to episode of syncope.  She apparently passed out at home.  On arrival of the EMS, blood sugar was 21.  She was unresponsive.  The  patient received D50 intravenously and sugar improved to 225 and she  regained consciousness.   On admission, she was not in acute distress.  Initial laboratory data showed  white count 15, hemoglobin 12.8, hematocrit 37.6, platelet count 292.  Sodium 137, potassium 4.1, chloride 101, white count 8.8, BUN 15, creatinine  1.1, and glucose 18.   ADMISSION DIAGNOSIS:  1.  Hypoglycemia.  2.  Poorly controlled type 2 diabetes  mellitus.   HOSPITAL COURSE:  On admission, the patient was placed on regular diet,  gentle hydration was given.  Antidiabetic agents were put on hold and they  were slowly resumed.  She had multiple episodes of hypoglycemia during the  hospitalization until her insulin was significantly reduced and her Avandia  and Glucophage were completely discontinued.  On April 14, 2004, she was  feeling much better without any complaints, her CBG was in the range of 115  to 160.  Her chest was clear.  Abdomen was benign.  Extremities showed no  edema.  She had no focal deficits.  She was, therefore, considered stable  for discharge home.   DISCHARGE MEDICATIONS:  70/30 insulin 25 units in the a.m., 20 units in the  p.m., Protonix 40 mg daily, Altace 2.5 mg daily, Singular 10 mg q.h.s.,  Synthroid 125 mcg daily, potassium chloride 10 mEq daily, Elavil 15 mg  q.h.s., Lasix 40 mg daily.   DISCHARGE INSTRUCTIONS:  Follow up with Dr. Nolene Ebbs in 1-2 weeks.  She  was to chart her CBS at home q.a.c. and q.h.s. and bring the chart into the  office for review.  She was also on Ciprofloxacin 250 mg 1 p.o. b.i.d. to  complete for 7 days for urinary tract infection.   DISCHARGE DIAGNOSIS:  1.  Hypoglycemia, resolved.  2.  Type 2 diabetes mellitus, poorly controlled.  3.  Urinary tract infection on therapy.   CONDITION ON DISCHARGE:  Stable.   DISPOSITION:  Home.      EA/MEDQ  D:  06/15/2004  T:  06/15/2004  Job:  [id number]

## 2010-10-01 NOTE — Discharge Summary (Signed)
NAME:  Amy [name], Amy [name]                          ACCOUNT NO.:  [id number]   MEDICAL RECORD NO.:  [id number]                   PATIENT TYPE:  INP   LOCATION:  9305                                 FACILITY:  [facility]   PHYSICIAN:  Naima A. Dillard, M.D.              DATE OF BIRTH:  01/16/1961   DATE OF ADMISSION:  11/03/2003  DATE OF DISCHARGE:  11/08/2003                                 DISCHARGE SUMMARY   DISCHARGE DIAGNOSES:  1. Symptomatic uterine fibroids.  2. Menorrhagia.  3. Pelvic adhesions.  4. Poorly controlled diabetes mellitus.  5. Blindness.   OPERATION:  On the day of admission the patient underwent a total abdominal  hysterectomy with lysis of adhesions and a left salpingo-oophorectomy,  tolerating all procedures well.   The patient was found to have a multiple fibroid uterus approximately 14  weeks in size, along with omental, bladder, and anterior wall adhesions. The  patient had normal appearing tubes and ovaries bilaterally; however,  secondary to uncontrolled bleeding her left tube and ovary were removed.   HISTORY OF PRESENT ILLNESS:  Ms. Kurihara is a 50 year old African American  female, gravida 2, para 2, who presents for hysterectomy because of  symptomatic uterine fibroids and menorrhagia. Please see patient's dictated  history and physical examination for details.   Preoperative physical exam revealed weight 190 pounds, blood pressure  118/70.  General exam is within normal limits. 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 Pelvic examination  revealed vulvovaginal area within normal limits. Cervix nontender without  lesions. The uterus 12-14 weeks size and irregular with no adnexal masses.  Rectovaginal exam is within normal limits, no masses, and with normal tone.   HOSPITAL COURSE:  On the day of admission the patient underwent  aforementioned procedures tolerating them all well. Postoperative course was  marked by  episodes of hypoglycemia and urinary retention which were  appropriately managed and resolved. The patient also was found to have a  temperature spike of 101.1 degrees Fahrenheit on postoperative day #2 along  with an elevated white blood cell count at 13.3 with a very slight very left  shift. The patient's fever was believed to be secondary to atelectasis.  Therefore, she was placed on Unasyn and was given vigorous pulmonary toilet.  The patient's postoperative hemoglobin was 10.3, however by the time of  discharge it had dropped to 9.5. Her preoperative hemoglobin was 12.6 and  though the patient had episodes of feeling light-headed/dizzy she was not  orthostatic and declined a blood transfusion. By postoperative day #5, the  patient's fever had defervesced, she had resumed bowel and bladder function,  and was therefore discharged home and was arranged to receive through  advanced home care nursing, home health nursing care.   DISCHARGE MEDICATIONS:  1. Reglan 10 mg one tablet q.6h. p.r.n. nausea and vomiting.  2. Novolin 70/30, three units subcu in the a.m. with breakfast and 22 units     with dinner.  3. Lantus 15 units subcu at bedtime.  4. Colace one tablet b.i.d.  5. Regular insulin on sliding scale. The patient was given written     instructions regarding this.  6. Ferrous sulfate one tablet b.i.d.  7. Augmentin one tablet b.i.d. for four days.  8. Phenergan suppositories 25 mg per rectum as needed for nausea and     vomiting and inability to tolerate anything by mouth.  9. Motrin one tablet q.8h. p.r.n. pain.  10.      Albuterol inhaler two puffs q.6h.  11.      Darvocet one tablet q.6h. p.r.n. pain.   FOLLOWUP:  The patient is to call [facility] for one week  follow-up visit with Dr. Charlesetta Garibaldi.   DISCHARGE INSTRUCTIONS:  The patient is to keep incision clean and dry. She  is to call for any temperature greater than 100 degrees, drain from her  incision, nausea,  or vomiting. She was further advised to maintain pelvic  rest with no heavy lifting and to follow instructions of [facility] postoperative instruction sheet. The patient is to follow a diabetic  diet. She is advised to resume pre-hospital medication.   FINAL PATHOLOGY:  Uterus, left ovary, and fallopian tube. Uterine cervix  with benign ectocervical and endocervical mucosa, focal squamous metaplasia.  Uterine corpus with benign proliferative endometrium and multiple  submucosal, intramural, and subserosal leiomyomata. Bilateral benign  fallopian tubes. Left ovary with hemorrhagic corpus luteum.     [place]mira J. Elizebeth Koller.                    Naima A. Charlesetta Garibaldi, M.D.    EJP/MEDQ  D:  12/14/2003  T:  12/14/2003  Job:  [id number]

## 2010-10-01 NOTE — H&P (Signed)
NAME:  Amy [name], Amy [name]                ACCOUNT NO.:  [id number]   MEDICAL RECORD NO.:  [id number]          PATIENT TYPE:  INP   LOCATION:  [id number]                         FACILITY:  [facility]   PHYSICIAN:  Sheila Oats, M.D.DATE OF BIRTH:  23-May-1960   DATE OF ADMISSION:  07/02/2006  DATE OF DISCHARGE:                              HISTORY & PHYSICAL   PRIMARY CARE PHYSICIAN:  Vikki Ports, M.D.   CHIEF COMPLAINT:  Nausea, vomiting, and elevated blood sugars.   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  She states that  about 4 days ago she began having nausea, vomiting, abdominal pain, and  diarrhea.  The diarrhea lasted about 3 days and is now resolved but the  other symptoms have persisted.  She was seen at the [facility]  ER the day after her symptoms onset and after managing her symptoms  symptomatically, she was discharged home.  She denies fevers, dysuria,  hematemesis, melena, and no hematochezia.  The lower abdominal pain she  describes as sharp, constant, and severe.  She was seen in the ER and  her lab work revealed an elevated blood glucose of 513.  An abdominal  ultrasound was also done and it showed a gallbladder polyp, no definite  stones, small free fluid around the gallbladder.  Gallbladder wall not  thickened.  A 2-cm liver lesion was also noted and followup imaging  studies recommended per radiologist.  She was admitted to the [facility] for further evaluation and management.   PAST MEDICAL HISTORY:  As above.   MEDICATIONS:  1. Altace 2.5 mg daily.  2. Aspirin 325 mg daily.  3. Calcium 600 mg plus D.  4. Clarinex 5 mg p.r.n.  5. Lasix 40 mg daily.  6. Lantus  70 units in the a.m. and 50 units in the p.m.  7. KCl 20 mEq daily.  8. Singulair 10 mg q.h.s.  9. Synthroid 137 mcg daily.  10.Cymbalta 60 mg daily.  11.NovoLog 35 units in the a.m. and 30 units in the p.m.   ALLERGIES:  PENICILLIN.   SOCIAL HISTORY:  She quit tobacco about 12 years ago.  She denies  alcohol.   FAMILY HISTORY:  Positive for heart disease and diabetes.   REVIEW OF SYSTEMS:  As per HPI, other review of systems negative.   PHYSICAL EXAMINATION:  GENERAL:  The patient is a middle-aged black  female.  She appears older than her stated age in no respiratory  distress.  VITAL SIGNS:  Temperature 97.9, blood pressure 142/91 initially, 98/96  current, pulse is 91, respiratory rate is 20.  HEENT:  Edentulous, dry mucous membranes, PERRL, EOMI, sclerae are  anicteric.  NECK:  Supple.  No adenopathy and no thyromegaly.  LUNGS:  Clear to auscultation bilaterally.  No crackles or wheezes.  CARDIOVASCULAR:  Regular rate and rhythm.  Normal S1 S2.  ABDOMEN:  Soft, bowel sounds present, mild lower abdominal tenderness.  No rebound tenderness.  No organomegaly and no masses palpable.  EXTREMITIES:  No cyanosis and no edema.  NEUROLOGIC:  She is alert and oriented x3.  Nonfocal exam.   LABORATORY DATA:  Sodium is 136, potassium 4.2, chloride is 101, CO2 is  28, glucose 513, BUN 10, creatinine 0.79, calcium 8.8.  Urinalysis is  negative for infection.  AST is 23, ALT is 21, alkaline phosphatase is  88.  Total bilirubin is 0.8.  Lipase is 14.  White cell count 6.3,  hemoglobin 13.2, hematocrit 38.4, platelet count is 204, neutrophil  count is 75%.  Gallbladder ultrasound as per HPI.   ASSESSMENT/PLAN:  1. Uncontrolled diabetes mellitus, hyperglycemia.  Monitor Accu-Chek,      cover with sliding scale, resume Lantus, hydrate, and follow.      Likely precipitated by a possible gastroenteritis.  2. Abdominal pain likely a viral gastroenteritis.  As noted the      patient  initially had diarrhea, nausea, vomiting, and lower      abdominal pain.  The diarrhea is now resolved per the patient      report.  Abdominal ultrasound as above with no acute findings.      Lipase negative.  Urinalysis also negative for infection.  We will      obtain cardiac enzymes, also a CT scan of the abdomen and follow.      Hydrate with intravenous fluids, pain management, keep nothing by      mouth for now, and follow.  3. Liver lesion.  A CT scan of the abdomen as above and follow.  4. Hypothyroidism.  Continue Synthroid.  5. History of asthma.  Continue bronchodilators.  6. Hypertension.  Hold antihypertensives for now as decreased blood      pressure.      Sheila Oats, M.D.  Electronically Signed     ACV/MEDQ  D:  07/03/2006  T:  07/04/2006  Job:  [id number]   cc:   Vikki Ports, M.D.  Fax: [phone]

## 2010-10-01 NOTE — Op Note (Signed)
[place]. [facility]  Patient:    Amy [name], Amy [name]                       MRN: [id number] Proc. Date: 03/06/00 Adm. Date:  [id number] Attending:  Nolon Bussing                           Operative Report  PREOPERATIVE DIAGNOSES: 1. Tractional detachment of the left eye, table-top, macula. 2. 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 3. _____, left eye.  POSTOPERATIVE DIAGNOSES: 1. Tractional detachment of the left eye, table-top, macula. 2. 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 3. _____, left eye.  PROCEDURE: 1. Posterior vitrectomy with membrane peel, left eye. 2. Endolaser and panretinal photocoagulation, left eye. 3. Injection of vitreous substitute, permanent, silicone oil, [id number]    centistokes, left eye.  SURGEON:  Nolon Bussing, M.D.  ANESTHESIA:  General endotracheal anesthesia.  INDICATION FOR PROCEDURE:  The patient is a 50 year old woman with advanced vision loss on the basis of table-top retinal detachment, left eye, on the basis of traction fibrovascular detachment, and a flat, plaque-like elevation of the entire macula extending to the disc and the entire arcades.  This is an attempt to reattach the retina to provide for restoration of ambulatory vision.  The patient understands the risks to the vision, including permanent loss of vision, but also to the eye, including hemorrhage, fracture, scarring, need for further surgery, no improvement in vision, loss of vision, and progressive disease despite intervention.  The patient was _____.  DESCRIPTION OF PROCEDURE:  Once the appropriate operative consent was obtained, the patient was taken to the operating room.  In the operating room, general endotracheal anesthesia was instituted without difficulty.  The left ocular region was  sterilely prepped and draped in the usual ophthalmic fashion.  A lid speculum applied.  A conjunctival peritomy fashioned temporally and superonasally.  A 4 mm infusion was secured 4 mm posterior to the limbus, inferotemporal quadrant.  Placement in the vitreous cavity verified visually.  Superior sclerotomy was then fashioned.  The _____ microscope placed in position with the ophthalmic attachment.  Core vitrectomy was then begun.  Modified en bloc techniques were then used to lift the dense cicatrix and fibrovascular proliferations off the entire posterior pole.  This had multifocal attachments and required tedious dissection in a modified en bloc technique.  A large retinal break was noted at the 12 oclock position slightly posterior to the equator at the base of the table-top rhegmatogenous retinal detachment.  After approximately 1-1/2 to almost two hours of dissection, the entire macula and posterior pole had been freed of the neovascular and old fibrous tissue and its attachments.  Excellent mobilization was obtained of the posterior retina.  No peripheral breaks were noted.  Fluid-fluid exchange was then completed, followed by fluid-air exchange.  This successfully reattached the retina.  Endolaser photocoagulation was then carried out 360 degrees as well around the edges of the retinal hole that was used for drainage of subretinal fluid.  Notable findings were that the optic nerve appeared perfused as well as the macular region, although there are significant corrugations to the retina from the profound elevated detachment.  The retina flattened nicely.  The eye had a normal lens with moderate nuclear sclerotic changes.  Nonetheless, from  prolonged tamponade and for ease of ambulation, it was necessary to provide a fluid-air exchange followed by an air-silicone oil exchange with [id number] centistokes silicone oil.  _____ photocoagulation had been carried out under air previously.  Now  silicone was placed.  The superior sclerotomies were then closed with 10-0 Vicryl suture. The infusion _____ were closed with 7-0 Vicryl suture.  At this time, the infusion removed.  This was also closed with 7-0 Vicryl suture.  The conjunctiva also closed with 7-0 Vicryl suture.  Subconjunctival injection of antibiotics was applied.  A sterile patch and Fox shield applied.  Intraocular pressure was assessed and found to be adequate.  The patient tolerated the procedure well without complications. DD:  03/06/00 TD:  03/07/00 Job: [id number]

## 2010-10-01 NOTE — Op Note (Signed)
[place]. [facility]  Patient:    Amy [name], Amy [name] Visit Number: [id number] MRN: [id number]          Service Type: DSU Location: [phone] Attending Physician:  Nolon Bussing Dictated by:   Nolon Bussing, M.D. Proc. Date: 08/13/01 Admit Date:  08/13/2001                             Operative Report  PREOPERATIVE DIAGNOSES: 1. Dense cataract, o.d., secondary to a history of complex retinal detachment,    secondary diabetic retinopathy requiring intraocular silicone use to repair    and maintain the retinal reattachment state, and development of progressive    opaque mature cataract in the right eye. 2. Blind patient in both eyes.  POSTOPERATIVE DIAGNOSES: 1. Dense cataract, o.d., secondary to a history of complex retinal detachment,    secondary diabetic retinopathy requiring intraocular silicone use to repair    and maintain the retinal reattachment state, and development of progressive    opaque mature cataract in the right eye. 2. Blind patient in both eyes.  PROCEDURE:  Complex Fako emulsification, cataract extraction, intraocular lens placement to the right eye via clear ______ incision with care taken to avoid prolapse and migration of the silicone off in the vitreous cavity.  SURGEON:  Nolon Bussing, M.D.  ANESTHESIA:  General endotracheal.  INDICATIONS FOR PROCEDURE:  The patient is a 50 year old woman with profound advanced diabetic retinopathy, combined ______ vitreal detachment of each eye with blind left eye and retinal reattachment repair of the right eye primarily with vitrectomy, silicone replacement.  She has developed over the last 6 to 9 months progressive opaque mature cataract of right eye, and now requiring removal for monitoring of the retina.  She also understands this is an attempt to restore at least ambulatory vision.  She understands the risks of anesthesia, including recurrence of ______, loss to the eye,  bleeding, infection, hemorrhage, scarring, need for further surgery, no change in vision, loss of vision, progressive disease despite intervention, and also the possibility for the need of silicone removal.  She gives her consent for surgery as well as anesthesia.  Immediately prior to the surgery she insisted on being put to sleep with general endotracheal anesthesia.  DESCRIPTION OF PROCEDURE:  The patient was taken to the operating room, endotracheal anesthesia was administered without difficulty.  The right periocular region was sterilely prepped and draped in the usual ophthalmic fashion.  At this time, an ______ corneal incision was fashioned in the supertemporal cornea of the right eye using a 15 degree Superblade as a keratome, no diamond blade was available in the operating room.  At the time of surgery an incision was required.  This 15 degree entry allowed for cystotome placement, and removal of the anterior capsular bag.  This proceeded without difficulty.  ______ had been used to deepen the anterior chamber.  At this time, the incision site was enlarged to allow passage of the [facility] probe. Fako emulsification was then carried out in the bag without trouble without difficulty maintaining appropriate volume throughout.  The lens was removed without difficulty in the one-handed technique.  A cortical clean up was unnecessary as the entire lens ______ and ______ without difficulty.  Fiko emulsification, however, was required to remove the dense nucleus.  At this time, the bag was then opened with the remainder of the ______.  The incision was then enlarged  to allow for passage of a 6 mm ______ lens because the patient has silicone oil in the eye.  A model EZE-60 lens was placed.  Power +20.0, serial number [id number], was placed in the bag and rotated to the horizontal position.  Miochol was then used to close the pupil.  Three interrupted 10-0 nylon sutures were then used to close the  clear corner of incision.  Knots were rotated and buried.  Subconjunctival injection of Ancef was applied.  Prior to the closure of the wound, the ______ had been aspirated free from the anterior chamber with irrigation aspiration hand piece.  At the closure of the procedure, sterile patch Flox shield applied.  The patient tolerated the procedure without complication.  She was taken to the recovery room in good stable condition. Dictated by:   Nolon Bussing, M.D. Attending Physician:  Nolon Bussing DD:  08/13/01 TD:  08/13/01 Job: [id number]

## 2010-10-01 NOTE — Discharge Summary (Signed)
Amy [name], Amy [name]                ACCOUNT NO.:  [id number]   MEDICAL RECORD NO.:  [id number]          PATIENT TYPE:  INP   LOCATION:  [id number]                         FACILITY:  [facility]   PHYSICIAN:  Jerelene Redden, MD      DATE OF BIRTH:  1960/06/27   DATE OF ADMISSION:  10/06/2004  DATE OF DISCHARGE:  10/12/2004                                 DISCHARGE SUMMARY   DISCHARGE DIAGNOSES:  1. Uncontrolled diabetes mellitus.  2. Pyuria, probable urinary tract infection.  3. Hypothyroidism.  4. Bronchitis/asthma.  5. Left eye blindness, secondary to diabetes mellitus.  6. Neuropathy.  7. History of cataracts and bilateral detached retinae.     STUDIES:  A renal ultrasound:  No evidence of hydronephrosis.  Diffuse fatty  liver infiltration.   CONSULTATIONS:  None.   HISTORY OF PRESENT ILLNESS:  The patient is a 50 year old black female with  a past medical history significant for diabetes and hypertension who  presented with elevated blood sugars x2 weeks and also complaints of back  and shoulder pain x3-4 days.  She also reported decreased urinary output and  constipation.   PHYSICAL EXAMINATION:  VITAL SIGNS:  As per Dr. Lenna Sciara L. Lovena Le revealed a  temperature of 98.3 degrees, blood pressure 103/62, pulse 76, respirations  20, O2 saturation 96%.  GENERAL:  She was in mild respiratory distress, secondary to her back pain.  HEENT:  She was noted to have thinning of her hair, wearing a wig.  Her left  eye was opacified.  ABDOMEN:  Noted to be mildly distended with bowel sounds present and her  bladder was palpable at the level of her umbilicus.  The rest of her physical examination was noted to be within normal limits.   LABORATORY DATA:  Urinalysis showed moderate leukocyte esterase and 11-20  WBC's.  White cell count was 10,  hemoglobin 13.1, hematocrit 39.5,  platelets 255, MCV 77.3.  Sodium 138, potassium 4.3, chloride 105, CO2 of  29, BUN 12, creatinine 0.7, glucose 322.   She had a KUB which showed air and stool scattered throughout the course of  the colon with non-specific bowel gas pattern.   Her chest x-ray showed no active cardiopulmonary disease.   Her TSH was elevated at 9.4 or 9.5.   HOSPITAL COURSE:  #1 - UNCONTROLLED DIABETES MELLITUS:  Upon admission the  patient was maintained on her Lantus 70 units q.a.m. and 30 units q.p.m.  Her Accu-Cheks were monitored and she was covered with sliding scale insulin  - NovoLog.  Her Glucophage was held upon admission.  Her hemoglobin A1c on  admission was noted to be 10.6.  It is unclear if the patient was adhering  to a diabetic diet or taking her medications properly.  She did request a  home health nurse to assist her with her medications.  Her blood sugars  while she was here in the hospital ranged from 109 to 150 for the most part,  and this was off the Glucophage.  The patient will be given literature on  her diabetic diet upon discharge.  She  is to follow up with her primary care  doctor, Dr. Vikki Ports, and also with her endocrinologist, Dr. Bubba Camp.  She is to keep a log of her blood sugars and bring them to her follow-up  visit, and to hold her Glucophage until that follow-up appointment.   #2 - PYURIA, PROBABLY URINARY TRACT INFECTION:  The patient's urinalysis was  consistent with a urinary tract infection, as stated above.  The urine and  blood cultures were done and the patient was empirically started on  antibiotics.  The blood cultures did not grow any bacteria.  The urine  cultures grew multiple species, with no uropathogens isolated.  The patient  has remained hemodynamically stable and afebrile.  She will be discharged on  Cipro for three more days, and is to follow up with her primary care  physician.   #3 - HYPOTHYROIDISM:  The patient's TSH was done upon admission and it was  noted to be elevated at 9.405.  Her Synthroid was increased from 125 mcg to  150 mcg while in the hospital,  and she is to follow up with Dr. Rita Ohara as  well as with Dr. Bubba Camp for a recheck of her TSH and adjustment of her dose  as appropriate.  The impression was that the patient's constipation was  likely related to her hypothyroidism.   #4 - NEUROPATHY:  The patient was continued on her Gabapentin during her  hospital stay.   #5 - ASTHMA:  The patient was maintained on her Singulair in the hospital.   #6 - HYPERTENSION:  Her blood pressure was controlled with Altace during her  hospital stay.   DISCHARGE MEDICATIONS:  1. Cipro 500 mg, one p.o. q.12h. x3 days.  2. Synthroid, dose changed to 150 mcg, one p.o. daily.  3. The patient is to continue her pre-admission medications, except as      indicated below.   The patient is to hold off Metformin until followup with Dr. Tomi Bamberger and Dr.  Bubba Camp, with a log of her blood sugars.   FOLLOWUP:  1. With Dr. Loman Brooklyn in five to seven days.  The patient is to call for an      appointment.  2. With Dr. Bubba Camp, endocrinologist, as scheduled.     DIET:  A diabetic diet.   SPECIAL INSTRUCTIONS:  The patient is to keep a log of her blood sugars.   CONDITION ON DISCHARGE:  Improved/stable.      ACV/MEDQ  D:  10/12/2004  T:  10/12/2004  Job:  [id number]   cc:   Vikki Ports, M.D.  [address] [place], [place] [address]  Fax: [phone]   Dr. Bubba Camp  -  Endocrinology

## 2010-10-01 NOTE — Discharge Summary (Signed)
NAME:  Amy [name], Amy [name]                          ACCOUNT NO.:  [id number]   MEDICAL RECORD NO.:  [id number]                   PATIENT TYPE:  INP   LOCATION:  [id number]                                 FACILITY:  [facility]   PHYSICIAN:  Tania Ade, M.D.                 DATE OF BIRTH:  [birth year]   DATE OF ADMISSION:  12/14/2001  DATE OF DISCHARGE:  12/17/2001                                 DISCHARGE SUMMARY   DISCHARGE DIAGNOSES:  1. Chest pain.  Negative Cardiolite ejection fraction 53%.  2. Insulin-dependent diabetes type 2.  3. 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  4. Chronic bronchitis.   DISCHARGE MEDICATIONS:  Aspirin 325 mg one p.o. q.d., Altace 2.5 mg one p.o.  q.d., Lasix 20 mg one p.o. q.d., Protonix 40 mg one p.o. q.d., Albuterol MDI  q.4h. 2 puffs as needed, Atrovent MDI q.6h. 2 puffs as needed, Insulin  70/30, 65 units in the morning, 55 units in the evening, Imdur 30 mg one  p.o. q.d., Nitroglycerin 0.4 mg p.r.n. chest pain, Lopressor 25 mg one p.o.  b.i.d.  The patient advised not to Enalapril.  The patient advised to follow  a low fat diet.   FOLLOW UP:  The patient is to follow up with Dr. Derrel Nip in about one month's  time in the [facility].   CONSULTATIONS:  [facility].   PROCEDURES DONE:  Cardiolite that was negative for ischemia.  Ejection  fraction 33%.  EKG showed normal sinus rhythm.   BRIEF HISTORY AND PHYSICAL:  The patient is a 50 year old, African-American  woman with history of Insulin-dependent diabetes, who presented to the  emergency room with complaint of chest pain which was substernal with  radiation to the back.  It was slightly relieved by sublingual  Nitroglycerin.  She also complained of shortness of breath with exertion  over the past few days with orthopnea.  Chest pain started the night before  her arrival and was progressively worsening.  Also she has chronic  bronchitis and has been using Albuterol mostly constantly for the past few  days.  She denied nausea, dizziness, or fever.   ALLERGIES:  NO KNOWN DRUG ALLERGIES.   HOME MEDICATIONS:  Altace 2.5 q.d., Enalapril 5 q.d., Isosorbide 30 q.d.,  Lasix 20 q.d., Aspirin, Albuterol, Atrovent.   PHYSICAL EXAMINATION:  VITAL SIGNS:  Temperature 98.4, heart rate 90, blood  pressure 130/100, respiratory rate 20, oxygen saturation 99% on room air.  Exam was significant for opacity of the left eye status-post surgery in both  eyes.  CARDIOVASCULAR:  Tenderness on costochondral junction area tender to  palpate.  LUNGS:  Showed decreased breath sounds bilaterally.  No crackles.  ABDOMEN:  Showed slight right upper quadrant tenderness and slight  suprapubic tenderness.  EKG:  Showed normal sinus rhythm.  CHEST X-RAY:  Showed some bronchial  ectomy.   LABORATORY DATA:  Hemoglobin 14.7, hematocrit 40, sodium 138, potassium 4.3,  chloride 102, bicarb 26, BUN 19, creatinine 1.0, glucose 426, pH 7.43, pCO2  36.5, CK 70, MB was 1.2, and troponin I 0.01.  The rest of the cardiac  enzymes were negative.  White count 8.8, platelets 204.   HOSPITAL COURSE:  1. Chest pain.  No shortness of breath.  This is likely secondary to acute     and chronic bronchitis.  However, with her risk factors and diabetes, we     decided to consult cardiology, who did a Cardiolite which was essentially     negative.  She did not have any further episodes of chest pain and she     did pretty well during the rest of the hospitalization without requiring     any further Nitroglycerin.  2. Hypertension.  It remained stable during her stay in the hospital.  She     was discharged on Metoprolol and ACE inhibitor.  She was previously on     two ACE inhibitors and she was advised not to continue both of them, just     the one Altace.  3. Back pain.  We were concerned initially about aortic dissection, but her     D-dimer was negative.   4. Asthma.  It remained stable during the rest of the hospital stay.   VITALS ON DISCHARGE:  Temperature 98.7, blood pressure 100/60, heart rate  80, respiratory rate 20, oxygen saturation 96% on room air.                                                 Tania Ade, M.D.    FM/MEDQ  D:  01/10/2002  T:  01/13/2002  Job:  [id number]   cc:   [facility]

## 2010-10-01 NOTE — Op Note (Signed)
NAME:  Amy [name], Amy [name]                          ACCOUNT NO.:  [id number]   MEDICAL RECORD NO.:  [id number]                   PATIENT TYPE:  INP   LOCATION:  9305                                 FACILITY:  [facility]   PHYSICIAN:  Naima A. Dillard, M.D.              DATE OF BIRTH:  [birth year]   DATE OF PROCEDURE:  11/03/2003  DATE OF DISCHARGE:                                 OPERATIVE REPORT   PREOPERATIVE DIAGNOSES:  1. Symptomatic fibroids.  2. Menorrhagia.  3. Diabetes.   POSTOPERATIVE DIAGNOSES:  1. Symptomatic fibroids.  2. Menorrhagia.  3. Diabetes.  4. Abdominopelvic adhesions.   PROCEDURE:  Total abdominal hysterectomy and left salpingo-oophorectomy.   ANESTHESIA:  General endotracheal tube.   SURGEON:  Naima A. Charlesetta Garibaldi, M.D.   ASSISTANT:  Everett Graff, M.D.   ESTIMATED BLOOD LOSS:  200 cc.   URINE OUTPUT:  300 cc clear urine at end of the procedure.   IV FLUIDS:  2500 cc Crystalloid.   COMPLICATIONS:  None.   FINDINGS:  A 14-week size uterus with multiple fibroids; omental, bladder  and anterior uterine wall adhesions noted.  The patient went to recovery  room in stable condition.   PROCEDURE IN DETAIL:  The patient was taken to the operating room where she  was given general anesthesia.  She was placed in the dorsal supine position,  prepped and draped in a normal sterile fashion.  A Foley catheter was  placed.  A Pfannenstiel skin incision was then made with the scalpel and  continued to the fascia using Bovie cautery.  The fascia was incised in the  midline and extended bilaterally.  Kocher's x2 were placed and the superior  aspect of the fascia was dissected off the rectus muscle both sharply and  bluntly.  The inferior aspect of the rectus muscles were separated in a  similar fashion.  The rectus muscles were separated in the midline, the  peritoneum was identified, tented up and then entered sharply with  Metzenbaum  scissors and extended bilaterally,  superiorly and inferiorly  with good visualization of bowel and bladder.  The uterus was then lifted  out of the abdomen, and just using retractors.  No self-retaining retractor  was used just using retractors.  We were able to see this large omental  adhesion carried to the bladder and to the anterior uterine wall.  This was  dissected sharply using Metzenbaum  scissors and the DeBakey's.  Once the  omentum was removed, both round ligaments were grasped and cauterized.  Hemostasis was assured.   The vesicouterine peritoneum was identified, tented up and entered sharply.  There was some vesicouterine adhesions which were taken down sharply using  the Metzenbaum  scissors. The bladder was then dissected off of the lower  uterine segment and cervix both bluntly and sharply.   Both uterine ovarian ligaments were doubly clamped and cut; Free tie ligated  and suture ligated.  Hemostasis was assured.  Both uterine arteries were  then skeletonized, grasped with Heaney's, cut and suture ligated.  Hemostasis was assured.  The cardinal ligaments were grasped with Heaney  clamps, cut and suture ligated.  Hemostasis was assured.  The uterosacral  ligaments were grasped with Heaney clamps, cut and suture ligated.  Hemostasis was assured.   The uterus and cervix was then removed and cut out using Satinsky scissors.  The vaginal cuff was repaired using 0 Vicryl in a figure-of-eight stitch.  The abdomen was irrigated with copious saline.  The patient's right ovarian  mucosa was made hemostatic with Bovie cautery and a figure-of-eight stitch.  The patient's left uterine-ovarian ligament was found to have some bleeding.  We tried to make it hemostatic using a stitch. A hematoma started to form  and still started to bleed.  The patient's left ureter was then identified  and found to be normal.  Her left uterine-ovarian ligament was then clamped  and the left uterine ovary and tube was then removed.  The  patient's right  tube was removed.  Hemostasis was assured.   The abdomen was then irrigated again with copious saline.  Hemostasis was  noted.  All instruments were removed from the vagina.  Sponge, lap and  needle counts were correct x2.  The fascia was closed with PDS.  A drain was  placed in the subcutaneous fascia, after hemostasis was noted.  The skin was  closed with subcuticular stitch of 3-0 Monocryl.  Hemostasis was assured.   The patient went to the recovery room in stable condition.                                               Naima A. Charlesetta Garibaldi, M.D.    NAD/MEDQ  D:  11/03/2003  T:  11/04/2003  Job:  [id number]

## 2010-10-01 NOTE — H&P (Signed)
NAME:  Amy [name], Amy [name]                ACCOUNT NO.:  [id number]   MEDICAL RECORD NO.:  [id number]          PATIENT TYPE:  INP   LOCATION:  1825                         FACILITY:  [facility]   PHYSICIAN:  Melissa L. Lovena Le, MD  DATE OF BIRTH:  01/07/1961   DATE OF ADMISSION:  10/06/2004  DATE OF DISCHARGE:                                HISTORY & PHYSICAL   CHIEF COMPLAINT:  Feeling bad, and high sugars.   PRIMARY CARE PHYSICIAN:  Vikki Ports, M.D.   ENDOCRINOLOGIST:  Dr. Jacelyn Pi, M.D.   HISTORY OF PRESENT ILLNESS:  The patient is a 50 year old, African-American  female who noted that her blood sugars have been elevated for the last two  weeks.  Over the course of that time, she has developed increasing back  pain, headache, pain across her shoulders, which has been most prominent  over the last three days.  She has had decreased appetite and periods where  she has decreased urinary output, but when she had a large bowel movement  this week she noticed increased urinary output.  The patient feels like her  bowels are just sitting there, and she is unable to move her bowels without  taking something.  This week, she took some Mag-Citrate and was able to  move.  She came to the emergency room for further evaluation and was found  to have a urinary tract infection, with possibility for pyelonephritis.   REVIEW OF SYSTEMS:  Positive constipation, for which she took Mag-Citrate.  Positive nausea and vomiting.  Question of urinary retention.  Positive  chills, no fever.  All other review of systems are negative.   PAST MEDICAL HISTORY:  1.  Asthma.  2.  Bronchitis.  3.  Neuropathy.  4.  Hypertension.  5.  Diabetes.  6.  She is legally blind.  Left eye is totally blind.  Right eye has      decreased vision secondary to diabetes and hypertension.  7.  She is hypothyroid.   PAST SURGICAL HISTORY:  1.  She had a cataract on her left eye.  2.  She has bilateral detached retinas.  3.   She had a hysterectomy secondary to fibroids.   MEDICATIONS:  1.  Lasix 40 mg b.i.d.  2.  Insulin 70 units in the morning, and 30 units at night, that is Lantus      insulin.  3.  NovoLog 10 in the morning, and NovoLog 10 at night.  4.  Glucophage 500 q.a.m. and q.p.m.  5.  Aspirin 325 daily.  6.  Protonix 40 mg q.a.m.  7.  Altace, but does not know the dosage.  8.  Calcium supplement.  9.  Potassium supplement.  10. Celebrex.   Her pharmacy is [facility] at [phone].   PHYSICAL EXAMINATION:  VITAL SIGNS:  Temperature 98.3; blood pressure  103/62; pulse 76; respirations 20; saturations 96%.  GENERAL:  She is in mild distress secondary to lower back pain.  HEENT:  She is normocephalic, atraumatic.  She has thinning hair, with a wig  overlying.  TMs are clear.  Pupil in the right eye is round and reactive.  Left eye is opacified secondary to cataracts and diabetes.  NECK:  Supple.  There is no JVD, no lymph nodes.  No carotid bruits.  CHEST:  Clear to auscultation.  There is no rhonchi, rales, or wheezes.  She  has decreased breath sounds in the bases.  CARDIOVASCULAR:  Regular rate and rhythm.  Positive S1, S2.  No S3, S4.  ABDOMEN:  Soft, mildly distended, with positive bowel sounds.  There is  positive palpable bladder at the level of the umbilicus.  EXTREMITIES:  Showed no clubbing, cyanosis, or edema.  NEUROLOGIC:  She is awake, alert, oriented x 3.  Cranial nerves II-XII were  intact.  Plantars downgoing.  Power is 5/5.  Deep tendon reflexes 1+-2 in  all extremities.   PERTINENT LABORATORY VALUES:  White count 10, hemoglobin 13.1, hematocrit  39.5, platelets 254, with an MCV of 77.3.  Sodium 138, potassium 4.3,  chloride 105, CO2 29, BUN 12, creatinine 0.7, glucose 322.   ASSESSMENT AND PLAN:  This is a 50 year old, African-American female with  known diabetes and hypertension, who presents with elevated blood sugars,  back pain, and feeling poorly and weak.  She also  complains of constipation  and decreased urine output overall, with a period of increased urine output  yesterday after moving her bowels.  I suspect that she may have an element  of urinary retention.  1.  Cardiovascular.  Hypertension.  Will resume her Altace in the morning      when we have obtained a dosage from her pharmacy.  Will continue her      Lasix b.i.d. and withhold parameters for decreased blood pressure.  2.  Pulmonary.  She has no complaints or issues at this time.  3.  GU.  Urinary tract infection.  I agree with the Cipro.  Will follow up      cultures of her urine and blood.  Because of the symptoms of urinary      retention, will ask for a catheter to be placed.  Will check an      ultrasound of the abdomen and kidneys in the morning.  4.  GI.  Constipation.  I would like to start her on MiraLax and check a      KUB.  Will also continue her Protonix.  5.  Endocrine. The patient has uncontrolled diabetes likely secondary to      infection.  Will continue her Lantus insulin in an aggressive sliding      scale.  Will gently  hydrate her, and may have to uptitrate her Lantus      while she is recovering.  Will hold her Glucophage for now.  6.  Hypothyroid.  We need to identify the doses of her Synthroid, at which      time we will resume that.  Will check a TSH, which may account for      constipation.  7.  Microcytic anemia. Will hand check all her stools and check iron      studies.  8.  We need to check in with her pharmacy wand get a medication list.  [facility]      [facility] in [phone].  9.  DVT prophylaxis with Lovenox.       MLT/MEDQ  D:  10/07/2004  T:  10/07/2004  Job:  [id number]   cc:   Jacelyn Pi, M.D.  [address] [place], [place] [address]  Fax: [phone]   Vikki Ports, M.D.  [address] [place], [place] [address]  Fax: [phone]

## 2010-10-01 NOTE — Consult Note (Signed)
NAME:  Amy [name], Amy [name]                          ACCOUNT NO.:  [id number]   MEDICAL RECORD NO.:  [id number]                   PATIENT TYPE:  INP   LOCATION:  3743                                 FACILITY:  [facility]   PHYSICIAN:  Satira Sark, M.D. [facility]        DATE OF BIRTH:  03-May-1961   DATE OF CONSULTATION:  DATE OF DISCHARGE:  12/17/2001                              CARDIOLOGY CONSULTATION   REASON FOR CONSULTATION:  Chest pain.   HISTORY OF PRESENT ILLNESS:  The patient is a 50 year old woman with past  medical history outlined below who presents with a two to three month  history of progressive dyspnea on exertion associated with intermittent  chest tightness.  The patient also has symptoms at rest and she has actually  been recently started on bronchodilator therapy for presumed reactive airway  disease.  She was recently diagnosed with bronchitis that is felt to be  chronic in nature.  She was apparently recently referred to Dr. Ernestine Mcmurray for further evaluation and is by report already scheduled for a  Cardiolite this coming Thursday.  She was admitted on the first of August  with progressive symptoms as outlined as well as orthopnea and peripheral  edema.  She has ruled out for myocardial infarction with serial cardiac  enzymes and her 12-lead electrocardiogram is fairly nonspecific.  We are  asked to assess further and assist with diagnostic evaluation.   ALLERGIES:  No known drug allergies   CURRENT MEDICATIONS:  1. Aspirin 325 mg p.o. q.d.  2. Insulin 70/30 with 65 units subcutaneously q.a.m. and 55 units     subcutaneously q.p.m.  3. Altace 2.5 mg p.o. q.d.  4. Lasix 20 mg p.o. q.d.  5. Colace 100 mg p.o. q.d.  6. Albuterol and Atrovent inhaler meter dose inhalers.  7. Sublingual nitroglycerin p.r.n.  8. She was also recently started on Lopressor 12.5 mg p.o. b.i.d.   PAST MEDICAL HISTORY:  1. Type 2 diabetes mellitus.  2. Blindness in both eyes, status  post cataract surgery on the right eye in     March of this year as well as a history of retinal detachment and     diabetic retinopathy.  3. History of mild left ventricular dysfunction by 2-d echocardiogram in     June of this year revealing global dysfunction with an ejection fraction     of 40-50% and mild mitral regurgitation.  4. Apparent history of chronic bronchitis, relatively recently started on     bronchodilator therapy over the last few months.  5. History of chest discomfort as described without clear diagnosis as of     yet.  The patient apparently saw Dr. Ernestine Mcmurray this past week and is     already scheduled for a Cardiolite next Thursday.   SOCIAL HISTORY:  The patient does have a history of tobacco use but quit  approximately five years ago.  She denies significant alcohol use.  She has  a Actuary during day time that helps her walk around the block.  She has been  unable to do this over the last several weeks due to dyspnea on exertion.   FAMILY HISTORY:  Noncontributory for premature coronary artery disease by  report.   REVIEW OF SYSTEMS:  As described in history of present illness.   PHYSICAL EXAMINATION:  VITAL SIGNS:  Blood pressure 130/80.  Oxygen  saturation 94% on room air.  Heart rate 80 to 100.  Telemetry shows normal  sinus rhythm at 80 to 90 beats per minute.  GENERAL:  This is an obese woman, lying supine in no acute distress.  NECK:  Reveals no elevated jugular venous pressure or carotid bruits.  No  thyromegaly or thyroid tenderness is noted.  LUNGS:  Exhibit mildly decreased breath sounds with a few faint crackles at  the bases but no wheezing or rhonchi on exam.  Respiratory rate is  nonlabored currently.  HEART:  An indistinct PMI probably due to body habitus with a regular rate  and rhythm and a soft systolic murmur along the left sternal border.  No S3  gallop is noted and there is no pericardial rub.  ABDOMEN:  Obese and nontender without  hepatomegaly or bruits.  EXTREMITIES:  No significant edema at this time.  Pulses are 2+.  SKIN:  No ulcerative changes are noted.  MUSCULOSKELETAL:  No kyphosis is noted.  NEUROPSYCHIATRIC:  The patient is alert and oriented x 3.  Affect is normal.   LABORATORY FINDINGS:  WBC is 10.6, hemoglobin 12.7, hematocrit 38.7,  platelet count 205,000.  D-dimer is 0.29.  Sodium 139, potassium 4.4,  chloride 106, bicarbonate 27, BUN 11, creatinine 0.9, glucose 396.  Troponin  I less than 0.01.  Peak CK of 70, peak CK-MB of 1.4.   CT scan of the chest, abdomen, and pelvis with contrast was performed and  revealed no evidence of thoracic aortic dissection.  No comment was made as  to the presence of a pulmonary embolus.  A large pelvic and abdominal mass  was observed, potentially representing the uterus.  There was also noted  some asymmetry of the right piriformis muscle.   Chest x-ray from December 14, 2001 revealed evidence of peribronchial  thickening.  A 12-lead electrocardiogram shows normal sinus rhythm at 84  beats per minute with nonspecific T-wave changes.   IMPRESSION:  1. Progressive dyspnea on exertion and intermittent chest discomfort in a 24-     year old woman with a history of type 2 diabetes mellitus with end-organ     disease as well as chronic bronchitis and potentially hypertension by     chart reviewed.  Recent 2-d echocardiogram showed global left ventricular     dysfunction estimated at 40-50% with mild mitral regurgitation.  Dr. Ernestine Mcmurray has apparently evaluated the patient recently in clinic and     scheduled her for a Cardiolite as an outpatient.  We will proceed at this     time with a dobutamine Cardiolite to further risk stratify the patient.     She may in fact need further testing with a right and left heart     catheterization depending on this results.  Would discontinue beta     blocker at the present until testing is complete. 2. Type 2 diabetes  mellitus with end-organ damage as outlined.  3. Unknown fasting lipid profile.  Given  her type 2 diabetes mellitus, the     patient's goal LDL cholesterol should be less than 100 and she should     most likely be on statin therapy at this time.  Would check a fasting     lipid profile if not already obtained.  D-dimer is negative.                                               Satira Sark, M.D. [facility]    SGM/MEDQ  D:  12/15/2001  T:  12/20/2001  Job:  [id number]

## 2010-10-01 NOTE — H&P (Signed)
NAME:  Amy [name], Amy [name]                          ACCOUNT NO.:  [id number]   MEDICAL RECORD NO.:  [id number]                   PATIENT TYPE:  INP   LOCATION:  9305                                 FACILITY:  [facility]   PHYSICIAN:  Naima A. Dillard, M.D.              DATE OF BIRTH:  1960/07/22   DATE OF ADMISSION:  11/03/2003  DATE OF DISCHARGE:                                HISTORY & PHYSICAL   CHIEF COMPLAINT:  Symptomatic fibroid and menorrhagia.   The patient is a 50 year old African-American female gravida 2, para 2, who  desires to have a hysterectomy secondary to menorrhagia and fibroids.  The  patient states for the last two years she has had irregular vaginal bleeding  which occurs about every other month.  On further questioning, usually  occurs about every 35 days.  The patient says that she bleeds for about four  days and changes the pad 6-7 times a day, and occasionally bleeds through  two pads at once.  The patient denies having any bleeding disorders.  She  has tried Aleve with some relief but not total relief.  She is currently not  using any contraception and does have a history of fibroids.  She denies  having menopausal symptoms, abdominal pain or increased stress.  An  ultrasound from March of 2005 showed an enlarged uterus measuring 15.2 x 9.5  x 11.1 cm, unable to visualize endometrial stripe but this seemed to be  about 8 mm with a 5 cm exophytic posterior fibroid.  Patient had a normal  appearing right ovary and the left ovary was unable to be identified.  She  had an endometrial biopsy which revealed secretory endometrium.  Patient's  primary care doctor revealed that her TSH is within normal limits on  Synthroid.   PAST MEDICAL HISTORY:  Significant for:  1. C-section x2.  2. Diabetes with each pregnancy.   PAST GYNECOLOGIC HISTORY:  Patient denies any history of sexually  transmitted diseases or abnormal Pap smear.  She has been celibate for 6  years.   Past  medical history significant for:  1. Retinal detachment.  2. Asthma.  3. Diabetes mellitus type 2.  4. Hypothyroidism.  5. Uterine fibroids.  6. 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  7. Peripheral neuropathy.   PAST SURGICAL HISTORY:  Significant for:  1. Left eye surgery x3.  2. Bilateral tubal ligation.   MEDICATIONS INCLUDE:  1. Humulin 70/30 with 100 unit pen, 55 units in the a.m. and 45 units in the     p.m.  2. Lantus insulin 30 units in the evening.  3. Protonix 40 mg p.o. daily.  4. Altace 2.5 mg p.o. daily.  5. Amitriptyline 50 mg p.o. q.h.s.  6. Lasix 40 mg p.o. daily.  7. NitroQuick 0.4 mg p.r.n. chest pain.  8. Panthenol 0.1% with 1-2 drops in both eyes twice a day.  9. Flexeril  5 mg b.i.d. p.r.n.  10.      Singulair 10 mg p.o. q.h.s.  11.      Flonase is two sprays to each nostril daily.  12.      Synthroid 0.0125 mg p.o. daily.  13.      Avandamet is 200 and 500 mg p.o. b.i.d.  14.      Aspirin is 325 mg p.o. daily.   FAMILY HISTORY:  Significant for asthma, heart disease and hypertension and  diabetes.   SOCIAL HISTORY:  Is negative for alcohol, drug and illicit drug use.   REVIEW OF SYSTEMS:  ENDOCRINE:  Is significant for diabetes, hypothyroidism,  blindness.  CARDIOVASCULAR:  Unremarkable.  GI:  Unremarkable.  GENITOURINARY:  As above.  NEUROMUSCULAR:  Significant for peripheral  nephropathy.  PSYCHIATRIC:  Unremarkable.   PERTINENT LABS:  Her Pap smear was within normal limits.   PHYSICAL EXAM:  The patient weighs 190 pounds, her blood pressure is 118/70.  The patient has left eye with glaucoma.  Patient is blind in both eyes.  NECK:  Free range of motion.  HEARING:  Within normal limits.  THROAT:  Clear.  THYROID:  No enlarged.  HEART:  Regular rate and rhythm.  LUNGS:  Clear to auscultation bilaterally.  ABDOMEN:  Soft and nontender.  BREASTS:  Has a left density at 12:00 with no nipple discharge, no axillary  masses.  Right  breast was within normal limits.  VULVOVAGINAL:  Within normal limits.  CERVIX:  Nontender without lesions.  UTERUS:  Is 12-14 weeks size and irregular with no adnexal masses.  RECTOVAGINAL EXAM:  Is within normal limits.  No masses with normal tone.   ASSESSMENT:  1. Fibroids.  2. Menorrhagia.  3. Left breast density.  4. Blindness.  5. Poorly controlled diabetes.   PLAN:  All treatments for fibroids were reviewed.  Medical and surgical  treatment such as, but not limited to, Depo-Provera.  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  Also would not recommend a ring secondary to the size of the  uterus.  The patient was offered Depo-Provera, ibuprofen, uterine artery  embolization, endometrial ablation, myomectomy, hysterectomy, or  observation.  Risks and benefits of all were reviewed with patient and  patient has chosen a hysterectomy.  She has had two C-sections and has no  descensus.  I reviewed the risks and benefits of laparoscopic-assisted  vaginal hysterectomy, the risks of total abdominal hysterectomy.  The  patient actually desired to have abdominal hysterectomy and does not want  laparoscopy.  Again, risks and benefits were reviewed.  Morbidity of poorly  controlled diabetes and diabetes of her degree with surgery were reviewed in  detail which included poor wound healing and wound dehiscence, also gangrene  of the wound which could lead to death.  The patient still agrees to the  procedure and desires total abdominal hysterectomy.  She understands the  risks of the hysterectomy are, but not limited to, bleeding, infection,  damage to internal organs such as bowel or bladder, major blood vessel  problems, also potential death.  Patient agrees to having the surgery done  and as far as consents each line was read to her in detail with witnesses present and the patient was able to sign and voiced  understanding.   On mammogram, she had a mammogram which showed some shadowing at 12:00 which  will be followed up in 6 months.  Naima A. Charlesetta Garibaldi, M.D.    NAD/MEDQ  D:  11/03/2003  T:  11/03/2003  Job:  [id number]

## 2010-11-07 ENCOUNTER — Emergency Department (HOSPITAL_COMMUNITY)
Admission: EM | Admit: 2010-11-07 | Discharge: 2010-11-07 | Disposition: A | Discharge status: Home / Self Care | Payer: PRIVATE HEALTH INSURANCE | Attending: Emergency Medicine | Admitting: Emergency Medicine

## 2010-11-07 DIAGNOSIS — E1169 Type 2 diabetes mellitus with other specified complication: Secondary | ICD-10-CM | POA: Insufficient documentation

## 2010-11-07 DIAGNOSIS — J45909 Unspecified asthma, uncomplicated: Secondary | ICD-10-CM | POA: Insufficient documentation

## 2010-11-07 DIAGNOSIS — I76 Septic arterial embolism: Secondary | ICD-10-CM | POA: Insufficient documentation

## 2010-11-07 DIAGNOSIS — Z794 Long term (current) use of insulin: Secondary | ICD-10-CM | POA: Insufficient documentation

## 2010-11-07 DIAGNOSIS — H547 Unspecified visual loss: Secondary | ICD-10-CM | POA: Insufficient documentation

## 2010-11-07 LAB — POCT I-STAT, CHEM 8
Calcium, Ion: 1.12 mmol/L (ref 1.12–1.32)
Creatinine, Ser: 0.6 mg/dL (ref 0.50–1.10)
Glucose, Bld: 193 mg/dL — ABNORMAL HIGH (ref 70–99)
Hemoglobin: 12.2 g/dL (ref 12.0–15.0)
Potassium: 3.6 mEq/L (ref 3.5–5.1)

## 2010-11-07 LAB — URINALYSIS, ROUTINE W REFLEX MICROSCOPIC
Bilirubin Urine: NEGATIVE
Glucose, UA: 250 mg/dL — AB
Specific Gravity, Urine: 1.008 (ref 1.005–1.030)

## 2010-11-07 LAB — URINE MICROSCOPIC-ADD ON

## 2010-11-26 ENCOUNTER — Emergency Department (HOSPITAL_COMMUNITY): Payer: PRIVATE HEALTH INSURANCE

## 2010-11-26 ENCOUNTER — Emergency Department (HOSPITAL_COMMUNITY)
Admission: EM | Admit: 2010-11-26 | Discharge: 2010-11-26 | Disposition: A | Discharge status: Home / Self Care | Payer: PRIVATE HEALTH INSURANCE | Attending: Internal Medicine | Admitting: Internal Medicine

## 2010-11-26 DIAGNOSIS — Z7982 Long term (current) use of aspirin: Secondary | ICD-10-CM | POA: Insufficient documentation

## 2010-11-26 DIAGNOSIS — R1013 Epigastric pain: Secondary | ICD-10-CM | POA: Insufficient documentation

## 2010-11-26 DIAGNOSIS — M25519 Pain in unspecified shoulder: Secondary | ICD-10-CM | POA: Insufficient documentation

## 2010-11-26 DIAGNOSIS — G8929 Other chronic pain: Secondary | ICD-10-CM | POA: Insufficient documentation

## 2010-11-26 DIAGNOSIS — M542 Cervicalgia: Secondary | ICD-10-CM | POA: Insufficient documentation

## 2010-11-26 DIAGNOSIS — Z79899 Other long term (current) drug therapy: Secondary | ICD-10-CM | POA: Insufficient documentation

## 2010-11-26 DIAGNOSIS — M549 Dorsalgia, unspecified: Secondary | ICD-10-CM | POA: Insufficient documentation

## 2010-11-26 DIAGNOSIS — E119 Type 2 diabetes mellitus without complications: Secondary | ICD-10-CM | POA: Insufficient documentation

## 2010-11-26 DIAGNOSIS — Z794 Long term (current) use of insulin: Secondary | ICD-10-CM | POA: Insufficient documentation

## 2010-11-26 DIAGNOSIS — H543 Unqualified visual loss, both eyes: Secondary | ICD-10-CM | POA: Insufficient documentation

## 2010-11-26 DIAGNOSIS — J45909 Unspecified asthma, uncomplicated: Secondary | ICD-10-CM | POA: Insufficient documentation

## 2010-11-26 DIAGNOSIS — E039 Hypothyroidism, unspecified: Secondary | ICD-10-CM | POA: Insufficient documentation

## 2010-11-26 DIAGNOSIS — M19019 Primary osteoarthritis, unspecified shoulder: Secondary | ICD-10-CM | POA: Insufficient documentation

## 2010-11-26 LAB — COMPREHENSIVE METABOLIC PANEL
ALT: 11 U/L (ref 0–35)
Alkaline Phosphatase: 102 U/L (ref 39–117)
CO2: 28 mEq/L (ref 19–32)
Chloride: 109 mEq/L (ref 96–112)
GFR calc Af Amer: >60 mL/min (ref >60)
GFR calc non Af Amer: >60 mL/min (ref >60)
Glucose, Bld: 56 mg/dL — ABNORMAL LOW (ref 70–99)
Potassium: 4.2 mEq/L (ref 3.5–5.1)
Sodium: 147 mEq/L — ABNORMAL HIGH (ref 135–145)
Total Bilirubin: 0.2 mg/dL — ABNORMAL LOW (ref 0.3–1.2)
Total Protein: 7.2 g/dL (ref 6.0–8.3)

## 2010-11-26 LAB — DIFFERENTIAL
Basophils Absolute: 0 10*3/uL (ref 0.0–0.1)
Lymphocytes Relative: 27 % (ref 12–46)
Lymphs Abs: 2.7 10*3/uL (ref 0.7–4.0)
Neutro Abs: 6.5 10*3/uL (ref 1.7–7.7)

## 2010-11-26 LAB — CBC
HCT: 37.7 % (ref 36.0–46.0)
Hemoglobin: 12.9 g/dL (ref 12.0–15.0)
RBC: 4.82 MIL/uL (ref 3.87–5.11)
WBC: 10.2 10*3/uL (ref 4.0–10.5)

## 2010-11-26 LAB — LIPASE, BLOOD: Lipase: 14 U/L (ref 11–59)

## 2010-12-30 ENCOUNTER — Emergency Department (HOSPITAL_COMMUNITY)
Admission: EM | Admit: 2010-12-30 | Discharge: 2010-12-30 | Disposition: A | Discharge status: Home / Self Care | Payer: PRIVATE HEALTH INSURANCE | Attending: Emergency Medicine | Admitting: Emergency Medicine

## 2010-12-30 DIAGNOSIS — M79609 Pain in unspecified limb: Secondary | ICD-10-CM | POA: Insufficient documentation

## 2010-12-30 DIAGNOSIS — E039 Hypothyroidism, unspecified: Secondary | ICD-10-CM | POA: Insufficient documentation

## 2010-12-30 DIAGNOSIS — Z794 Long term (current) use of insulin: Secondary | ICD-10-CM | POA: Insufficient documentation

## 2010-12-30 DIAGNOSIS — E119 Type 2 diabetes mellitus without complications: Secondary | ICD-10-CM | POA: Insufficient documentation

## 2010-12-30 DIAGNOSIS — H543 Unqualified visual loss, both eyes: Secondary | ICD-10-CM | POA: Insufficient documentation

## 2010-12-30 LAB — GLUCOSE, CAPILLARY: Glucose-Capillary: 362 mg/dL — ABNORMAL HIGH (ref 70–99)

## 2011-01-16 ENCOUNTER — Inpatient Hospital Stay (INDEPENDENT_AMBULATORY_CARE_PROVIDER_SITE_OTHER)
Admission: RE | Admit: 2011-01-16 | Discharge: 2011-01-16 | Disposition: A | Discharge status: Home / Self Care | Payer: PRIVATE HEALTH INSURANCE | Source: Ambulatory Visit | Attending: Family Medicine | Admitting: Family Medicine

## 2011-01-16 DIAGNOSIS — N760 Acute vaginitis: Secondary | ICD-10-CM

## 2011-01-16 LAB — WET PREP, GENITAL: Yeast Wet Prep HPF POC: NONE SEEN

## 2011-02-03 LAB — DIFFERENTIAL
Eosinophils Relative: 1
Lymphocytes Relative: 24
Lymphs Abs: 1.9
Monocytes Absolute: 0.6
Monocytes Relative: 7

## 2011-02-03 LAB — COMPREHENSIVE METABOLIC PANEL
ALT: 12
AST: 12
Albumin: 2.9 — ABNORMAL LOW
Calcium: 9.3
Creatinine, Ser: 0.7
GFR calc Af Amer: >60
Sodium: 144

## 2011-02-03 LAB — URINE MICROSCOPIC-ADD ON

## 2011-02-03 LAB — URINALYSIS, ROUTINE W REFLEX MICROSCOPIC
Bilirubin Urine: NEGATIVE
Glucose, UA: NEGATIVE
Hgb urine dipstick: NEGATIVE
Nitrite: NEGATIVE
Specific Gravity, Urine: 1.013
pH: 6

## 2011-02-03 LAB — CBC
MCHC: 33.6
MCV: 79.5
Platelets: 227
WBC: 8

## 2011-02-07 LAB — URINE MICROSCOPIC-ADD ON

## 2011-02-07 LAB — URINALYSIS, ROUTINE W REFLEX MICROSCOPIC
Ketones, ur: NEGATIVE
Leukocytes, UA: NEGATIVE
Nitrite: NEGATIVE
Protein, ur: NEGATIVE
pH: 5

## 2011-02-07 LAB — POCT CARDIAC MARKERS
Myoglobin, poc: 43.2
Operator id: 295021

## 2011-02-07 LAB — I-STAT 8, (EC8 V) (CONVERTED LAB)
Acid-Base Excess: 2
BUN: 17
Chloride: 101
Potassium: 3.7
pH, Ven: 7.375 — ABNORMAL HIGH

## 2011-02-07 LAB — POCT I-STAT CREATININE: Creatinine, Ser: 1.1

## 2011-02-09 LAB — URINALYSIS, ROUTINE W REFLEX MICROSCOPIC
Glucose, UA: >1000 — AB
Leukocytes, UA: NEGATIVE
pH: 5.5

## 2011-02-09 LAB — DIFFERENTIAL
Basophils Absolute: 0
Basophils Relative: 0
Eosinophils Relative: 1
Lymphocytes Relative: 22
Neutro Abs: 6.4

## 2011-02-09 LAB — POCT CARDIAC MARKERS
Operator id: 261601
Troponin i, poc: <0.05

## 2011-02-09 LAB — URINE MICROSCOPIC-ADD ON

## 2011-02-09 LAB — CBC
HCT: 35.7 — ABNORMAL LOW
Platelets: 203
RDW: 14.2

## 2011-02-09 LAB — BASIC METABOLIC PANEL
BUN: 17
Calcium: 9
GFR calc non Af Amer: >60
Glucose, Bld: 436 — ABNORMAL HIGH

## 2011-02-11 LAB — BASIC METABOLIC PANEL
BUN: 4 — ABNORMAL LOW
BUN: 8
CO2: 29
CO2: 29
Chloride: 107
Chloride: 110
Creatinine, Ser: 0.67
Creatinine, Ser: 0.68
Glucose, Bld: 150 — ABNORMAL HIGH
Glucose, Bld: 181 — ABNORMAL HIGH
Potassium: 4

## 2011-02-11 LAB — POCT CARDIAC MARKERS
Operator id: 290111
Troponin i, poc: <0.05

## 2011-02-11 LAB — DIFFERENTIAL
Basophils Absolute: 0
Eosinophils Relative: 0
Lymphocytes Relative: 15
Lymphocytes Relative: 16
Lymphs Abs: 1.6
Lymphs Abs: 1.9
Monocytes Relative: 4
Neutro Abs: 9 — ABNORMAL HIGH
Neutrophils Relative %: 80 — ABNORMAL HIGH
Neutrophils Relative %: 81 — ABNORMAL HIGH

## 2011-02-11 LAB — CBC
HCT: 34.2 — ABNORMAL LOW
HCT: 37.9
HCT: 41.3
Hemoglobin: 11.4 — ABNORMAL LOW
MCHC: 33.6
MCV: 79.4
MCV: 79.8
Platelets: 210
RBC: 4.29
RBC: 4.73
RBC: 5.19 — ABNORMAL HIGH
WBC: 12 — ABNORMAL HIGH
WBC: 7.2

## 2011-02-11 LAB — COMPREHENSIVE METABOLIC PANEL
AST: 14
BUN: 13
CO2: 29
Calcium: 9.6
Creatinine, Ser: 0.56
GFR calc Af Amer: >60
GFR calc non Af Amer: >60
Glucose, Bld: 130 — ABNORMAL HIGH
Total Bilirubin: 0.6

## 2011-02-11 LAB — HEMOGLOBIN A1C
Hgb A1c MFr Bld: 11.7 — ABNORMAL HIGH
Mean Plasma Glucose: 339

## 2011-02-11 LAB — GLUCOSE, CAPILLARY
Glucose-Capillary: 145 — ABNORMAL HIGH
Glucose-Capillary: 148 — ABNORMAL HIGH
Glucose-Capillary: 156 — ABNORMAL HIGH
Glucose-Capillary: 159 — ABNORMAL HIGH
Glucose-Capillary: 162 — ABNORMAL HIGH
Glucose-Capillary: 191 — ABNORMAL HIGH
Glucose-Capillary: 257 — ABNORMAL HIGH
Glucose-Capillary: 271 — ABNORMAL HIGH
Glucose-Capillary: 34 — CL
Glucose-Capillary: 35 — CL
Glucose-Capillary: >600
Glucose-Capillary: 111 — ABNORMAL HIGH
Glucose-Capillary: 95

## 2011-02-11 LAB — APTT: aPTT: 29

## 2011-02-11 LAB — PROTIME-INR
INR: 1
Prothrombin Time: 13.4

## 2011-02-11 LAB — CARDIAC PANEL(CRET KIN+CKTOT+MB+TROPI)
CK, MB: 2.1
CK, MB: 2.2
Relative Index: 2.1
Troponin I: 0.06
Troponin I: 0.08 — ABNORMAL HIGH

## 2011-02-11 LAB — LIPASE, BLOOD: Lipase: 13

## 2011-02-11 LAB — URINALYSIS, ROUTINE W REFLEX MICROSCOPIC
Leukocytes, UA: NEGATIVE
Nitrite: NEGATIVE
Specific Gravity, Urine: 1.015
Urobilinogen, UA: 0.2
pH: 7

## 2011-02-11 LAB — PREGNANCY, URINE: Preg Test, Ur: NEGATIVE

## 2011-02-11 LAB — TROPONIN I: Troponin I: 0.04

## 2011-02-11 LAB — URINE MICROSCOPIC-ADD ON

## 2011-02-14 LAB — URINE CULTURE: Colony Count: 50000

## 2011-02-14 LAB — COMPREHENSIVE METABOLIC PANEL
Alkaline Phosphatase: 84
BUN: 9
Chloride: 103
GFR calc non Af Amer: >60
Glucose, Bld: 157 — ABNORMAL HIGH
Potassium: 3.7
Total Bilirubin: 0.6

## 2011-02-14 LAB — DIFFERENTIAL
Basophils Absolute: 0.1
Basophils Relative: 1
Neutro Abs: 4.1
Neutrophils Relative %: 58

## 2011-02-14 LAB — URINALYSIS, ROUTINE W REFLEX MICROSCOPIC
Bilirubin Urine: NEGATIVE
Ketones, ur: NEGATIVE
Leukocytes, UA: NEGATIVE
Nitrite: NEGATIVE
Protein, ur: 100 — AB
Urobilinogen, UA: 1
pH: 6

## 2011-02-14 LAB — CBC
HCT: 39.2
Hemoglobin: 13.1
RDW: 13.8
WBC: 7

## 2011-02-14 LAB — URINE MICROSCOPIC-ADD ON

## 2011-02-14 LAB — POCT I-STAT, CHEM 8
Glucose, Bld: 99
HCT: 44
Hemoglobin: 15
Potassium: 3.4 — ABNORMAL LOW
Sodium: 141
TCO2: 30

## 2011-02-15 LAB — CBC
HCT: 38.7
Hemoglobin: 13
MCHC: 33.6
MCV: 80.5
Platelets: 189
RBC: 4.8
RDW: 14.7
WBC: 6.6

## 2011-02-15 LAB — URINALYSIS, ROUTINE W REFLEX MICROSCOPIC
Bilirubin Urine: NEGATIVE
Glucose, UA: 250 — AB
Ketones, ur: NEGATIVE
Leukocytes, UA: NEGATIVE
Nitrite: NEGATIVE
Protein, ur: 100 — AB
Specific Gravity, Urine: 1.017
Urobilinogen, UA: 0.2
pH: 5.5

## 2011-02-15 LAB — DIFFERENTIAL
Basophils Absolute: 0
Basophils Relative: 1
Eosinophils Absolute: 0.1
Eosinophils Relative: 1
Lymphocytes Relative: 37
Lymphs Abs: 2.5
Monocytes Absolute: 0.5
Monocytes Relative: 8
Neutro Abs: 3.5
Neutrophils Relative %: 53

## 2011-02-15 LAB — COMPREHENSIVE METABOLIC PANEL
ALT: 11
AST: 17
Albumin: 3.3 — ABNORMAL LOW
Alkaline Phosphatase: 76
BUN: 8
CO2: 26
Calcium: 8.9
Chloride: 106
Creatinine, Ser: 0.66
GFR calc Af Amer: >60
GFR calc non Af Amer: >60
Glucose, Bld: 176 — ABNORMAL HIGH
Potassium: 3.4 — ABNORMAL LOW
Sodium: 138
Total Bilirubin: 0.3
Total Protein: 6.6

## 2011-02-15 LAB — URINE MICROSCOPIC-ADD ON

## 2011-02-15 LAB — LIPASE, BLOOD: Lipase: 15

## 2011-02-15 LAB — POCT CARDIAC MARKERS
CKMB, poc: <1 — ABNORMAL LOW
Myoglobin, poc: 38.5
Troponin i, poc: <0.05

## 2011-02-15 LAB — B-NATRIURETIC PEPTIDE (CONVERTED LAB): Pro B Natriuretic peptide (BNP): 57

## 2011-02-15 LAB — GLUCOSE, CAPILLARY: Glucose-Capillary: 196 — ABNORMAL HIGH

## 2011-02-22 LAB — CBC
HCT: 38.8
MCV: 79.7
Platelets: 222
RDW: 14.1 — ABNORMAL HIGH

## 2011-02-22 LAB — DIFFERENTIAL
Basophils Absolute: 0
Eosinophils Absolute: 0.1
Eosinophils Relative: 1
Neutrophils Relative %: 69

## 2011-02-22 LAB — BASIC METABOLIC PANEL
BUN: 20
Chloride: 103
Creatinine, Ser: 0.67
GFR calc non Af Amer: >60
Glucose, Bld: 255 — ABNORMAL HIGH
Potassium: 3.9

## 2011-02-22 LAB — URINALYSIS, ROUTINE W REFLEX MICROSCOPIC
Ketones, ur: NEGATIVE
Nitrite: NEGATIVE
Protein, ur: NEGATIVE
Urobilinogen, UA: 0.2

## 2011-02-25 LAB — CBC
HCT: 36.4
Hemoglobin: 12.2
MCHC: 33.6
RDW: 14.1 — ABNORMAL HIGH

## 2011-02-25 LAB — DIFFERENTIAL
Basophils Absolute: 0
Basophils Relative: 0
Monocytes Relative: 6
Neutro Abs: 14.7 — ABNORMAL HIGH
Neutrophils Relative %: 85 — ABNORMAL HIGH

## 2011-02-25 LAB — COMPREHENSIVE METABOLIC PANEL
Alkaline Phosphatase: 108
BUN: 15
Glucose, Bld: 351 — ABNORMAL HIGH
Potassium: 3.5
Total Protein: 6.6

## 2011-02-25 LAB — URINE MICROSCOPIC-ADD ON

## 2011-02-25 LAB — URINALYSIS, ROUTINE W REFLEX MICROSCOPIC
Bilirubin Urine: NEGATIVE
Ketones, ur: NEGATIVE
Nitrite: NEGATIVE
Protein, ur: NEGATIVE
Urobilinogen, UA: 0.2

## 2011-02-26 ENCOUNTER — Emergency Department (HOSPITAL_COMMUNITY)
Admission: EM | Admit: 2011-02-26 | Discharge: 2011-02-26 | Disposition: A | Discharge status: Home / Self Care | Payer: PRIVATE HEALTH INSURANCE | Attending: Emergency Medicine | Admitting: Emergency Medicine

## 2011-02-26 DIAGNOSIS — M79609 Pain in unspecified limb: Secondary | ICD-10-CM | POA: Insufficient documentation

## 2011-02-26 DIAGNOSIS — Z79899 Other long term (current) drug therapy: Secondary | ICD-10-CM | POA: Insufficient documentation

## 2011-02-26 DIAGNOSIS — R252 Cramp and spasm: Secondary | ICD-10-CM | POA: Insufficient documentation

## 2011-02-26 LAB — POCT I-STAT, CHEM 8
Chloride: 102 mEq/L (ref 96–112)
HCT: 43 % (ref 36.0–46.0)
Potassium: 3.4 mEq/L — ABNORMAL LOW (ref 3.5–5.1)
Sodium: 139 mEq/L (ref 135–145)

## 2011-02-26 LAB — GLUCOSE, CAPILLARY
Glucose-Capillary: 60 mg/dL — ABNORMAL LOW (ref 70–99)
Glucose-Capillary: 176 mg/dL — ABNORMAL HIGH (ref 70–99)

## 2011-03-11 ENCOUNTER — Emergency Department (HOSPITAL_COMMUNITY): Payer: PRIVATE HEALTH INSURANCE

## 2011-03-11 ENCOUNTER — Inpatient Hospital Stay (HOSPITAL_COMMUNITY)
Admission: EM | Admit: 2011-03-11 | Discharge: 2011-03-15 | DRG: 638 | Disposition: A | Discharge status: Skilled Nursing Facility | Payer: PRIVATE HEALTH INSURANCE | Attending: Internal Medicine | Admitting: Internal Medicine

## 2011-03-11 DIAGNOSIS — F411 Generalized anxiety disorder: Secondary | ICD-10-CM | POA: Diagnosis present

## 2011-03-11 DIAGNOSIS — D649 Anemia, unspecified: Secondary | ICD-10-CM | POA: Diagnosis present

## 2011-03-11 DIAGNOSIS — E7800 Pure hypercholesterolemia, unspecified: Secondary | ICD-10-CM | POA: Diagnosis present

## 2011-03-11 DIAGNOSIS — E876 Hypokalemia: Secondary | ICD-10-CM | POA: Diagnosis present

## 2011-03-11 DIAGNOSIS — E1142 Type 2 diabetes mellitus with diabetic polyneuropathy: Secondary | ICD-10-CM | POA: Diagnosis present

## 2011-03-11 DIAGNOSIS — E8809 Other disorders of plasma-protein metabolism, not elsewhere classified: Secondary | ICD-10-CM | POA: Diagnosis present

## 2011-03-11 DIAGNOSIS — L0201 Cutaneous abscess of face: Secondary | ICD-10-CM | POA: Diagnosis present

## 2011-03-11 DIAGNOSIS — E039 Hypothyroidism, unspecified: Secondary | ICD-10-CM | POA: Diagnosis present

## 2011-03-11 DIAGNOSIS — H543 Unqualified visual loss, both eyes: Secondary | ICD-10-CM | POA: Diagnosis present

## 2011-03-11 DIAGNOSIS — E1065 Type 1 diabetes mellitus with hyperglycemia: Secondary | ICD-10-CM | POA: Diagnosis present

## 2011-03-11 DIAGNOSIS — L03211 Cellulitis of face: Secondary | ICD-10-CM | POA: Diagnosis present

## 2011-03-11 DIAGNOSIS — E1049 Type 1 diabetes mellitus with other diabetic neurological complication: Secondary | ICD-10-CM | POA: Diagnosis present

## 2011-03-11 DIAGNOSIS — I959 Hypotension, unspecified: Secondary | ICD-10-CM | POA: Diagnosis not present

## 2011-03-11 DIAGNOSIS — E1010 Type 1 diabetes mellitus with ketoacidosis without coma: Principal | ICD-10-CM | POA: Diagnosis present

## 2011-03-11 DIAGNOSIS — Z794 Long term (current) use of insulin: Secondary | ICD-10-CM

## 2011-03-11 LAB — URINALYSIS, ROUTINE W REFLEX MICROSCOPIC
Bilirubin Urine: NEGATIVE
Nitrite: NEGATIVE
Protein, ur: NEGATIVE mg/dL
Specific Gravity, Urine: 1.026 (ref 1.005–1.030)
Urobilinogen, UA: 0.2 mg/dL (ref 0.0–1.0)

## 2011-03-11 LAB — TROPONIN I: Troponin I: <0.3 ng/mL (ref <0.30)

## 2011-03-11 LAB — BASIC METABOLIC PANEL
BUN: 8 mg/dL (ref 6–23)
BUN: 9 mg/dL (ref 6–23)
CO2: 19 mEq/L (ref 19–32)
CO2: 27 mEq/L (ref 19–32)
Calcium: 5.9 mg/dL — CL (ref 8.4–10.5)
Calcium: 9 mg/dL (ref 8.4–10.5)
Chloride: 103 mEq/L (ref 96–112)
Creatinine, Ser: 0.54 mg/dL (ref 0.50–1.10)
Creatinine, Ser: 0.64 mg/dL (ref 0.50–1.10)
Glucose, Bld: 447 mg/dL — ABNORMAL HIGH (ref 70–99)

## 2011-03-11 LAB — GLUCOSE, CAPILLARY
Glucose-Capillary: 543 mg/dL — ABNORMAL HIGH (ref 70–99)
Glucose-Capillary: 464 mg/dL — ABNORMAL HIGH (ref 70–99)

## 2011-03-11 LAB — CBC
MCHC: 33.7 g/dL (ref 30.0–36.0)
MCV: 78.9 fL (ref 78.0–100.0)
Platelets: 171 10*3/uL (ref 150–400)
RDW: 14 % (ref 11.5–15.5)
WBC: 9.8 10*3/uL (ref 4.0–10.5)

## 2011-03-11 LAB — POCT I-STAT, CHEM 8
Calcium, Ion: 0.87 mmol/L — ABNORMAL LOW (ref 1.12–1.32)
HCT: 30 % — ABNORMAL LOW (ref 36.0–46.0)
TCO2: 16 mmol/L (ref 0–100)

## 2011-03-11 LAB — URINE MICROSCOPIC-ADD ON

## 2011-03-11 LAB — DIFFERENTIAL
Basophils Absolute: 0 10*3/uL (ref 0.0–0.1)
Eosinophils Absolute: 0 10*3/uL (ref 0.0–0.7)
Eosinophils Relative: 0 % (ref 0–5)
Monocytes Absolute: 0.6 10*3/uL (ref 0.1–1.0)

## 2011-03-11 LAB — LACTIC ACID, PLASMA: Lactic Acid, Venous: 1.6 mmol/L (ref 0.5–2.2)

## 2011-03-11 LAB — CK TOTAL AND CKMB (NOT AT ARMC): Relative Index: INVALID (ref 0.0–2.5)

## 2011-03-12 LAB — BASIC METABOLIC PANEL
BUN: 9 mg/dL (ref 6–23)
BUN: 8 mg/dL (ref 6–23)
CO2: 27 mEq/L (ref 19–32)
CO2: 28 mEq/L (ref 19–32)
CO2: 27 mEq/L (ref 19–32)
Calcium: 9.8 mg/dL (ref 8.4–10.5)
Chloride: 103 mEq/L (ref 96–112)
Chloride: 103 mEq/L (ref 96–112)
Chloride: 105 mEq/L (ref 96–112)
Creatinine, Ser: 0.57 mg/dL (ref 0.50–1.10)
Creatinine, Ser: 0.58 mg/dL (ref 0.50–1.10)
Creatinine, Ser: 0.59 mg/dL (ref 0.50–1.10)
GFR calc Af Amer: >90 mL/min (ref >90)
Glucose, Bld: 417 mg/dL — ABNORMAL HIGH (ref 70–99)
Glucose, Bld: 355 mg/dL — ABNORMAL HIGH (ref 70–99)
Glucose, Bld: 282 mg/dL — ABNORMAL HIGH (ref 70–99)
Potassium: 5.1 mEq/L (ref 3.5–5.1)
Sodium: 138 mEq/L (ref 135–145)

## 2011-03-12 LAB — VITAMIN B12: Vitamin B-12: 360 pg/mL (ref 211–911)

## 2011-03-12 LAB — HEPATIC FUNCTION PANEL
ALT: 9 U/L (ref 0–35)
Albumin: 2.4 g/dL — ABNORMAL LOW (ref 3.5–5.2)
Alkaline Phosphatase: 105 U/L (ref 39–117)
Total Bilirubin: 0.1 mg/dL — ABNORMAL LOW (ref 0.3–1.2)
Total Protein: 6.4 g/dL (ref 6.0–8.3)

## 2011-03-12 LAB — IRON AND TIBC
Iron: 73 ug/dL (ref 42–135)
TIBC: 213 ug/dL — ABNORMAL LOW (ref 250–470)

## 2011-03-12 LAB — CARDIAC PANEL(CRET KIN+CKTOT+MB+TROPI)
CK, MB: 2.1 ng/mL (ref 0.3–4.0)
Relative Index: INVALID (ref 0.0–2.5)
Total CK: 52 U/L (ref 7–177)
Total CK: 42 U/L (ref 7–177)

## 2011-03-12 LAB — GLUCOSE, CAPILLARY
Glucose-Capillary: 116 mg/dL — ABNORMAL HIGH (ref 70–99)
Glucose-Capillary: 343 mg/dL — ABNORMAL HIGH (ref 70–99)
Glucose-Capillary: 300 mg/dL — ABNORMAL HIGH (ref 70–99)

## 2011-03-12 LAB — HEMOGLOBIN A1C
Hgb A1c MFr Bld: 12.8 % — ABNORMAL HIGH (ref <5.7)
Mean Plasma Glucose: 321 mg/dL — ABNORMAL HIGH (ref <117)

## 2011-03-12 LAB — CALCIUM, IONIZED: Calcium, Ion: 1.26 mmol/L (ref 1.12–1.32)

## 2011-03-12 LAB — LACTIC ACID, PLASMA: Lactic Acid, Venous: 2.3 mmol/L — ABNORMAL HIGH (ref 0.5–2.2)

## 2011-03-12 LAB — FOLATE: Folate: 9.8 ng/mL

## 2011-03-13 LAB — CBC
HCT: 36.5 % (ref 36.0–46.0)
MCHC: 33.7 g/dL (ref 30.0–36.0)
RDW: 14.2 % (ref 11.5–15.5)
WBC: 7.6 10*3/uL (ref 4.0–10.5)

## 2011-03-13 LAB — GLUCOSE, CAPILLARY
Glucose-Capillary: 408 mg/dL — ABNORMAL HIGH (ref 70–99)
Glucose-Capillary: 303 mg/dL — ABNORMAL HIGH (ref 70–99)
Glucose-Capillary: 233 mg/dL — ABNORMAL HIGH (ref 70–99)

## 2011-03-13 LAB — BASIC METABOLIC PANEL
Chloride: 99 mEq/L (ref 96–112)
GFR calc Af Amer: >90 mL/min (ref >90)
GFR calc non Af Amer: >90 mL/min (ref >90)
Potassium: 5.1 mEq/L (ref 3.5–5.1)
Sodium: 131 mEq/L — ABNORMAL LOW (ref 135–145)

## 2011-03-14 LAB — GLUCOSE, CAPILLARY
Glucose-Capillary: 326 mg/dL — ABNORMAL HIGH (ref 70–99)
Glucose-Capillary: 376 mg/dL — ABNORMAL HIGH (ref 70–99)

## 2011-03-14 LAB — BASIC METABOLIC PANEL
BUN: 22 mg/dL (ref 6–23)
Creatinine, Ser: 0.99 mg/dL (ref 0.50–1.10)
GFR calc non Af Amer: 65 mL/min — ABNORMAL LOW (ref >90)
Glucose, Bld: 293 mg/dL — ABNORMAL HIGH (ref 70–99)
Potassium: 4.7 mEq/L (ref 3.5–5.1)

## 2011-03-14 LAB — CBC
HCT: 34.6 % — ABNORMAL LOW (ref 36.0–46.0)
Hemoglobin: 11.4 g/dL — ABNORMAL LOW (ref 12.0–15.0)
MCHC: 32.9 g/dL (ref 30.0–36.0)
MCV: 78.5 fL (ref 78.0–100.0)

## 2011-03-15 LAB — GLUCOSE, CAPILLARY

## 2011-03-16 LAB — VITAMIN D 1,25 DIHYDROXY
Vitamin D 1, 25 (OH)2 Total: 82 pg/mL — ABNORMAL HIGH (ref 18–72)
Vitamin D2 1, 25 (OH)2: <8 pg/mL

## 2011-03-16 NOTE — Discharge Summary (Signed)
NAMEMarland [name]  Amy, [name] NO.:  [id number]  MEDICAL RECORD NO.:  [id number]  LOCATION:  [id number]                         FACILITY:  [facility]  PHYSICIAN:  Niel Hummer, MD    DATE OF BIRTH:  November 19, 1960  DATE OF ADMISSION:  03/11/2011 DATE OF DISCHARGE:  03/15/2011                        DISCHARGE SUMMARY - REFERRING   DISCHARGE DIAGNOSES: 1. Diabetes, uncontrolled, early diabetic ketoacidosis vs HONK. 2. Cellulitis of the nose. 3. Hypokalemia, resolved. 4. Hypocalcemia, resolved. 5. Anemia, normocytic and normochromic. 6. Peripheral neuropathy secondary to uncontrolled diabetes.  PAST MEDICAL HISTORY: 1. Hypothyroidism. 2. History of renal failure. 3. Blindness. 4. Hypercholesterolemia. 5. Anxiety.  PAST SURGICAL HISTORY: 1. Cholecystectomy. 2. Hysterectomy. 3. Cataract.  DISCHARGE MEDICATIONS: 1. Ciprofloxacin 500 mg p.o. b.i.d. for a total of 7 days. 2. Clindamycin 300 mg p.o. 3 times a day for a total of 7 days. 3. B12 100 mcg daily. 4. Guaifenesin 200 mg p.o. b.i.d. 5. Mupirocin 2% 1 application nasally twice daily. 6. MiraLax 17 g p.o. daily as needed. 7. NovoLog 10 units subcutaneously 3 times a day before meals. 8. Albuterol 2 puffs inhaled every 6 hours as needed. 9. Aspirin 325 mg p.o. daily. 10.Lantus 28 units subcutaneously every morning. 11.Lyrica 50 mg p.o. b.i.d. 12.Singulair 10 mg p.o. daily. 13.Synthroid 200 mcg p.o. daily. 14.Zocor 20 mg p.o. daily. 15.protonix 40 mg po daily.  DISPOSITION AND FOLLOWUP:  The patient will be transferred to skilled nursing facility due to her blindness.  She will need to work with physical therapy and recover better before she is able to go home or I think that she might benefit of assisted living facility and further physical therapy evaluation.  Her home situation may be not safe.  She will need assistant prior to her being discharged to home from the skilled nursing facility.  She will need  evaluation by PT.  She will need likely assistant with meals and medications.  The patient will need MMA level.  BRIEF HISTORY OF PRESENT ILLNESS:  This is a very pleasant 50 year old with history of diabetes, hypothyroidism, depression, and blindness, who presents to the emergency department complaining of worsening redness on her nose.  She relates that she went to see her primary care physician due to swelling of her nose 5 days prior to admission.  She was started on antibiotics without any improvement.  She was seen today by her home health nurse who recommended to call her doctor for another appointment because the redness of her nose was getting worse and the swelling was getting worse.  The patient had called her primary care physician, but she was referred to the emergency department.  The patient related that she has not been eating well.  She sometimes does not use her insulin, her NovoLog because she does not have food.  She has been vomiting over the last couple of days twice.  In the emergency department, she was found to be acidotic, hypokalemia, and with elevated blood sugar.  HOSPITAL COURSE: 1. Diabetes.  The patient presented with DKA picture  versus     HONK.  Her bicarbonate on admission was at 16.  Her gap was 18 and  glucose 428.  She was started on IV fluids, admitted to the ICU.     Her potassium was repleted.  When her potassium was more than 3.3,     she was started on the insulin drip.  The patient was subsequently,     off the insulin drip and transitioned to Lantus.  She will be     discharged on 28 units of Lantus and meals coverage. 2. Cellulitis of the face involving the nose.  The patient was started     on IV clindamycin and also ciprofloxacin to cover for pseudomonas     in a diabetic patient.  She will need a total of 7 days of     antibiotics.  Today, October 29th is day #3 of antibiotics.  Her     swelling and redness of the nose has  significantly improved. 3. Hypokalemia with a potassium on admission at 2.1.  This was     repleted aggressively with IV KCl x5 rounds and oral.  When her     potassium increased to 3.3, she was started on insulin drip and we     will continue to monitor her potassium level.  On the day prior to     discharge, her potassium is 4.7. 4. Hypocalcemia.  She had a calcium level of 5.9.  She was initially     started on oral calcium.  Her calcium increased to 9 and     subsequently has increased to 11.  Calcium supplement was stopped.     She will need a BMET to follow calcium level. 5. Hypertension.  Her blood pressure medication has been on hold.  I     will continue to hold her blood pressure medications.  Her systolic     blood pressure has been in the 100 range.  We can consider starting     lisinopril when blood pressure increases. 6. Hypothyroidism.  Continue with Synthroid. 7. Peripheral neuropathy.  The patient was complaining of numbness and     tingling of bilateral foot.  I think this is likely secondary to     uncontrolled diabetes, but also a B12 was low normal in the 300     range.  Please consider MMA level.  I started her on vitamin B12 at     100 mcg p.o. daily. On the day of discharge, the patient is in stable condition.  Blood pressure 114/78, saturating 97% on room air, respirations 18, and pulse 84.  Labs on the day prior to discharge, sodium 136, potassium 4.7, chloride 102, bicarbonate 27, BUN 22, creatinine 0.85, and glucose 293. Hemoglobin 11.4, hematocrit 34, and platelet count 201.  Calcium 8.3.  The patient was discharged in stable condition.     Niel Hummer, MD     BR/MEDQ  D:  03/14/2011  T:  03/14/2011  Job:  [id number]  Electronically Signed by Niel Hummer MD on 03/16/2011 09:22:40 PM

## 2011-03-16 NOTE — H&P (Signed)
NAMEMarland [name]  Amy, [name] NO.:  [id number]  MEDICAL RECORD NO.:  [id number]  LOCATION:  38                         FACILITY:  [facility]  PHYSICIAN:  Niel Hummer, MD    DATE OF BIRTH:  07-Oct-1960  DATE OF ADMISSION:  03/11/2011 DATE OF DISCHARGE:                             HISTORY & PHYSICAL   CHIEF COMPLAINT:  Worsening redness on her nose.  BRIEF HISTORY OF PRESENT ILLNESS:  This is a 50 year old with past medical history of diabetes, hypothyroidism, depression, blindness, who presents to the emergency department due to worsening redness of her nose.  She related that she went to her primary care physician due to a swelling of her nose last Monday, 5 days prior to admission.  She was started on antibiotics, Z-Pak without any improvement.  She was seen today by her home health nurse, who recommended her to call her doctor for another appointment because redness of her nose was getting worse and the swelling was getting worse.  The patient had called her primary care physician, she was referred to the emergency department.  The patient related that she has not been eating well.  She said that she sometimes does not use her insulin, her NovoLog because she does not have food.  She has been only taking her Lantus.  She has been having some numbness and tingling on her bilateral lower extremities for months.  She has been vomiting over the last couple of days, twice.  She has some abdominal pain earlier during the week, but this has resolved. She denies any dysuria.  ALLERGIES:  She has allergy to: 1. PENICILLIN. 2. CODEINE.  PAST MEDICAL HISTORY: 1. Diabetes. 2. Hypothyroidism. 3. Depression. 4. History of UTI. 5. Prior history of renal failure. 6. Blindness. 7. Hypercholesterolemia. 8. Anxiety and funny thoughts. 9. Arthritis.  PAST SURGICAL HISTORY: 1. Cholecystectomy. 2. Hysterectomy. 3. Tooth fixation. 4. Cataract surgery.  HOME  MEDICATIONS: 1. Altace 5 mg p.o. daily. 2. Lyrica 50 mg p.o. daily. 3. Zocor 20 mg p.o. daily. 4. Synthroid 200 mcg p.o. daily. 5. Potassium p.o. daily as needed. 6. Lasix p.o. daily as needed. 7. Lasix 40 mg p.o. daily as needed. 8. Albuterol 2 puffs every 6 hours as needed. 9. NovoLog sliding scale. 10.Lantus 28 units subcu in the morning. 11.Singulair 10 mg p.o. daily. 12.Aspirin 325 mg p.o. daily.  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  She has a prior history of smoking, she quit many years ago.  She denies recreational drugs or alcohol.  She lives by herself.  She has an aide that go and see her when she is not working.  She has a son who sometimes come and visit her.  FAMILY HISTORY:  Patient was adopted.  She has a sister and an aunt and a son without diabetes.  Her aunt had heart disease.  ALLERGIES: 1. PENICILLIN. 2. CODEINE.  PHYSICAL EXAMINATION:  VITAL SIGNS:  Blood pressure 119/71, pulse 86, respiration 18, saturation 94 on room air, temp 98.1. GENERAL:  Patient is lying in bed, in no acute distress.  Legally blind. HEENT:  Head atraumatic, normocephalic.  Eyes, anicteric.  Nose with swelling and redness. CARDIOVASCULAR:  S1, S2.  Regular rhythm and rate.  No rubs, murmurs, or gallops. LUNGS:  Bilateral good air movement.  No wheezing, crackles, or rhonchi. ABDOMEN:  Bowel sounds present.  Soft, nontender, nondistended.  No rigidity.  No guarding. EXTREMITY:  No edema. NEURO EXAM:  Nonfocal.  Legally blind.  LABORATORY DATA:  I-STAT chemistry:  Hemoglobin 10.0.  Sodium 144, potassium 2.3, chloride 110, glucose 427, BUN 6, creatinine 0.50.  UA, negative leukocyte, negative ketones.  White blood cell 9.8, hemoglobin 9.2, platelets 171.  X-ray, CT maxillofacial, diffuse soft tissue swelling involving the nose.  Left maxillary mucous retention cyst.  The orbital proptosis.  HOSPITAL COURSE: 1. Diabetes DKA, vs HONK.   Bicarb at 16, anion gap at 18, glucose 428.  We will     check blood ketones.  We will continue with IV fluids.  He received     2 L in the emergency department.  We will add KCl to the IV fluid     due to hypokalemia.  We will do a workup for infection, although     DKA might be precipitated by noncompliance with insulin or maybe     nose cellulitis.  I will check blood cultures, chest x-ray, urine     cultures.  We will start the DKA protocol when her potassium level     increases to more than 3.3 to avoid decrease further potassium level. 2. Cellulitis of the nose.  We will start clindamycin.  Patient with    We will also start ciprofloxacin to cover for any Pseudomonas in a diabetic patient. 3. Hypokalemia.  We will check a stat BMET and replete potassium with     10 mEq IV times total 5 round.  We will start insulin drip when     potassium more than 3.3.  We will continue with IV fluid with 20 mEq     of KCl.  We will give also some oral supplements.Will check Mg level. 4. Hypocalcemia.  We will check albumin level.  We will give calcium     gluconate IV.  Potassium of 5.9. 5. For DVT prophylaxis, SCDs at this time.     Niel Hummer, MD     BR/MEDQ  D:  03/11/2011  T:  03/12/2011  Job:  [id number]  Electronically Signed by Niel Hummer MD on 03/16/2011 09:19:37 PM

## 2011-03-18 LAB — CULTURE, BLOOD (SINGLE): Culture: NO GROWTH

## 2011-03-18 LAB — CULTURE, BLOOD (ROUTINE X 2): Culture  Setup Time: 201210270035

## 2011-04-25 IMAGING — CT CT ABDOMEN W/O CM
2 of 4 series · 17 of 46 positions shown, 19 images · non-contrast
Comparison: 02/20/2008, 02/18/2008, 07/03/2006

CT ABDOMEN

CLINICAL DATA: Hyperglycemia, vomiting, lower abdominal pelvic
pain

CT ABDOMEN AND PELVIS WITHOUT CONTRAST
TECHNIQUE: Multidetector CT imaging of the abdomen and pelvis was
performed following the standard protocol without intravenous
contrast.

[Series 2: stone <(id) >(id) · axial · 0.70mm/px · z∈[-416,-61]mm · 14 of 79 slices shown, 16 images]
[im 4/79  soft-tissue]
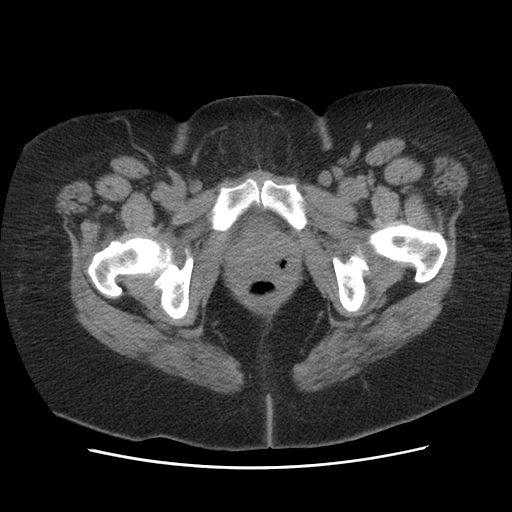
[im 4/79  bone]
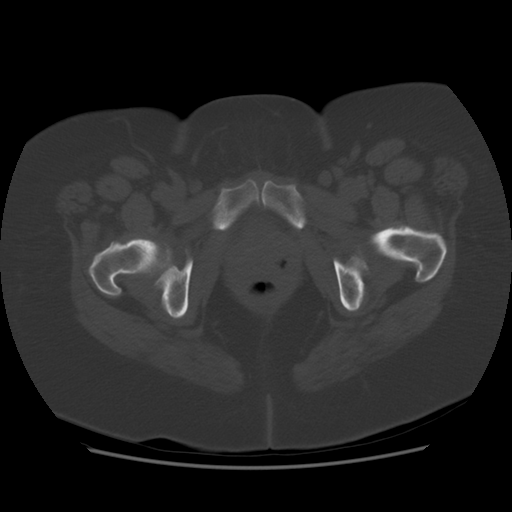
[im 10/79  soft-tissue]
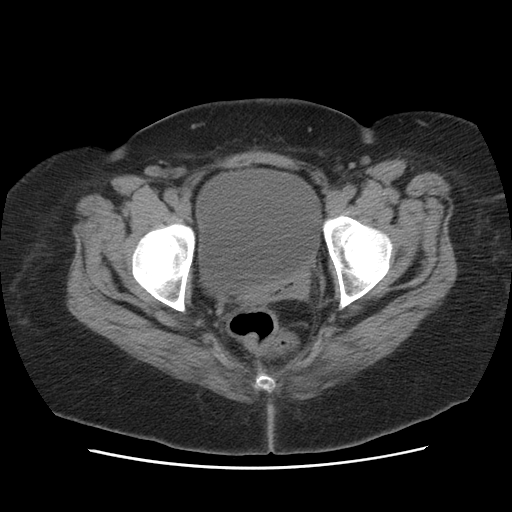
[im 16/79  soft-tissue]
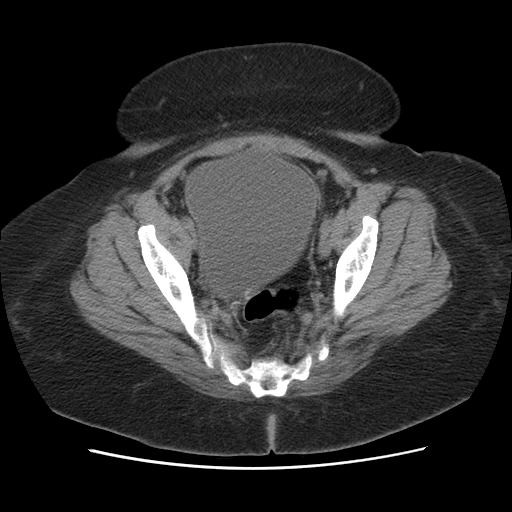
[im 22/79  soft-tissue]
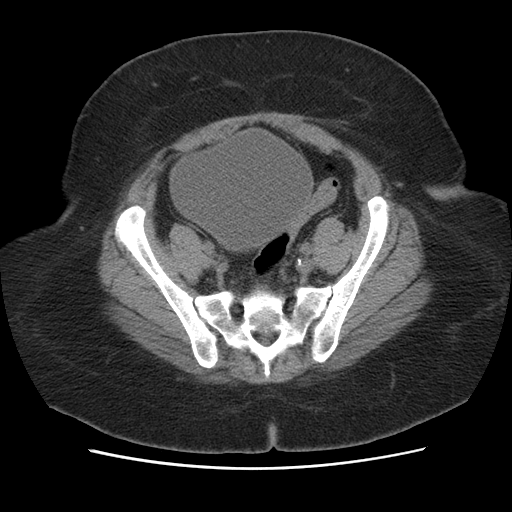
[im 25/79  soft-tissue]
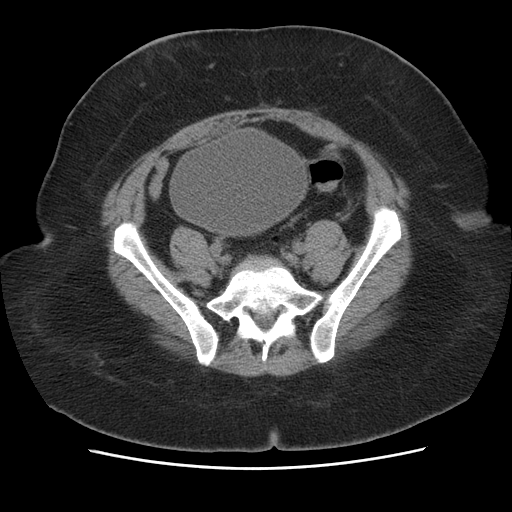
[im 32/79  soft-tissue]
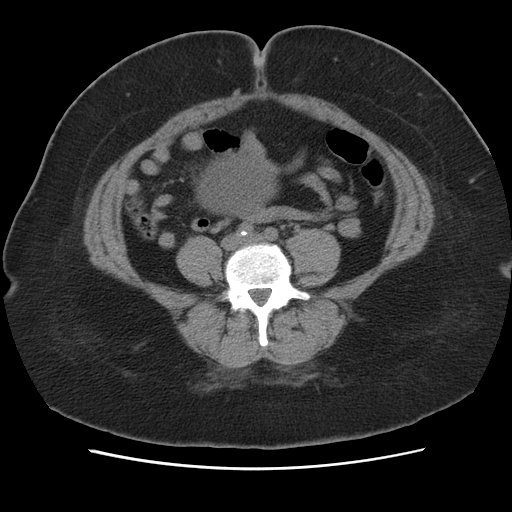
[im 38/79  soft-tissue]
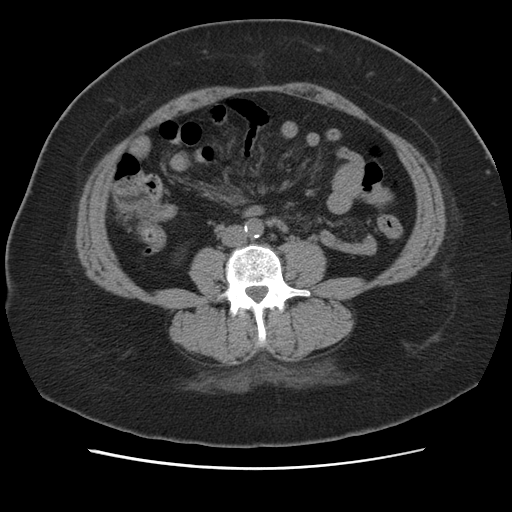
[im 41/79  soft-tissue]
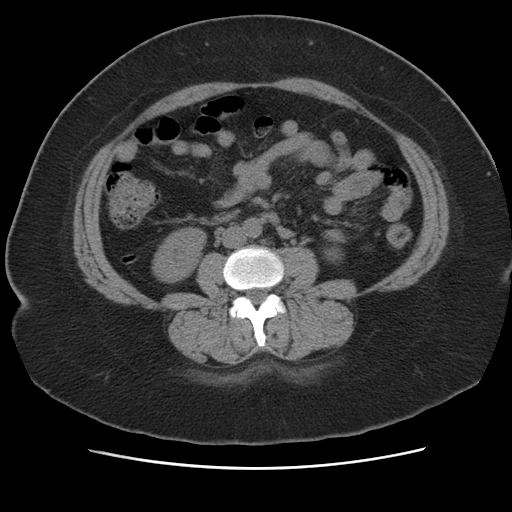
[im 47/79  soft-tissue]
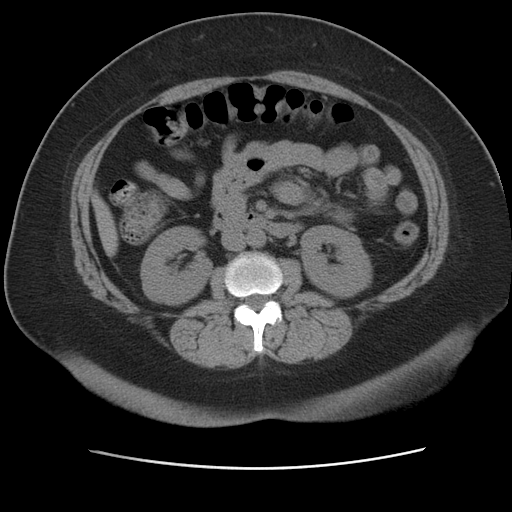
[im 47/79  bone]
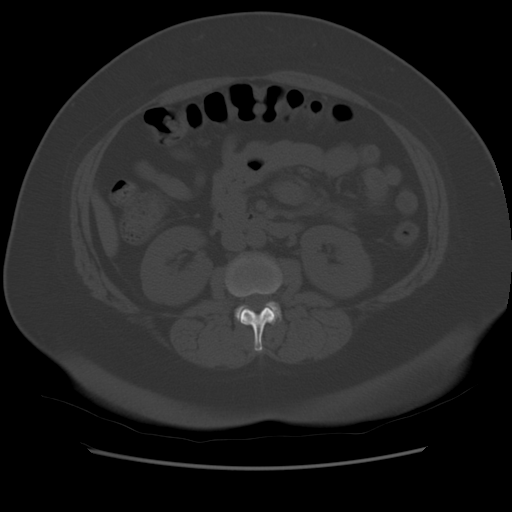
[im 54/79  soft-tissue]
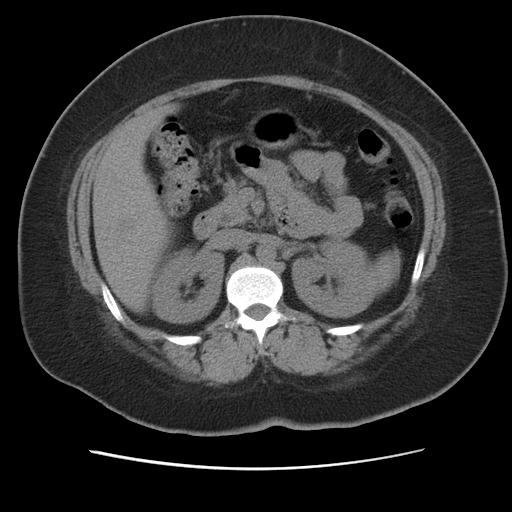
[im 60/79  soft-tissue]
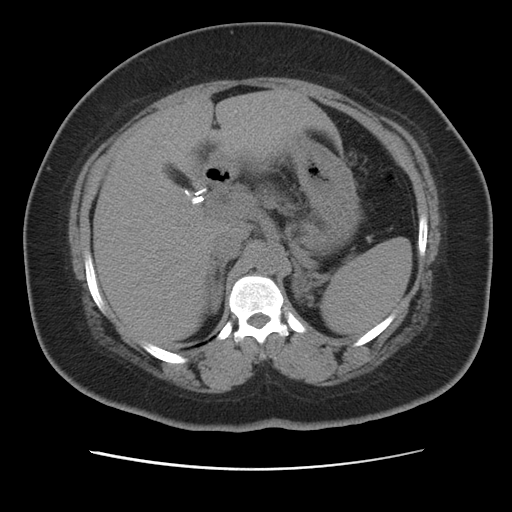
[im 63/79  soft-tissue]
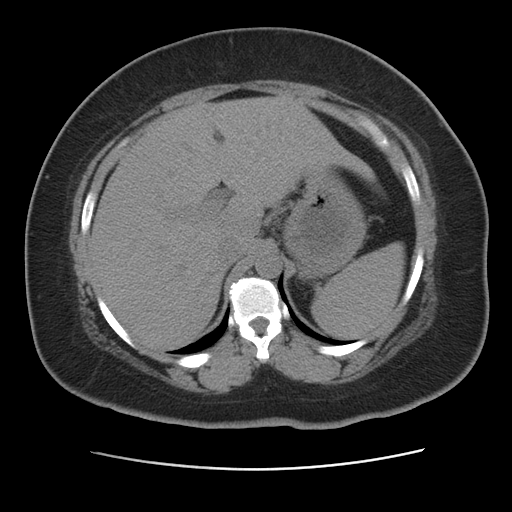
[im 69/79  soft-tissue]
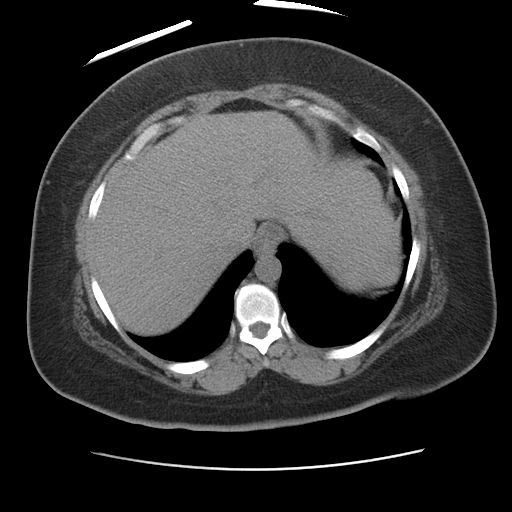
[im 75/79  soft-tissue]
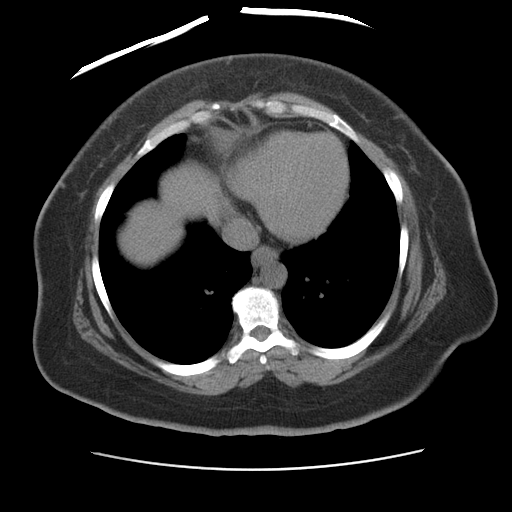

[Series 400: cor · coronal · 0.83mm/px · 3 of 86 slices shown]
[im 29/86  soft-tissue]
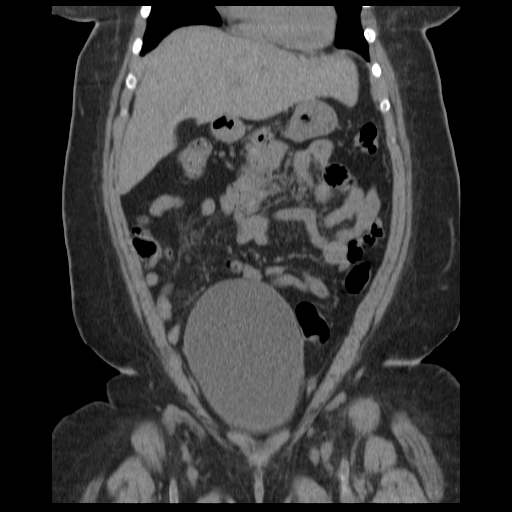
[im 38/86  soft-tissue]
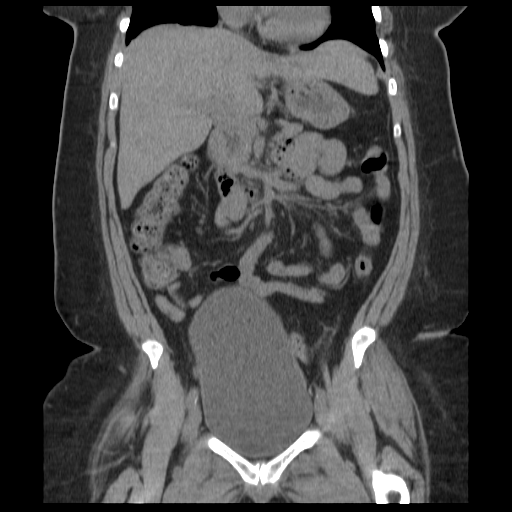
[im 48/86  soft-tissue]
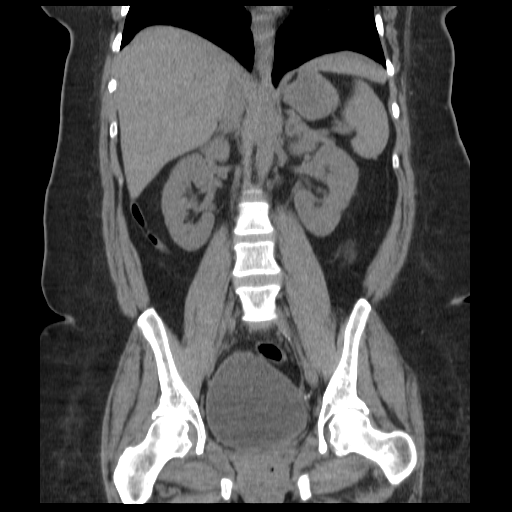

[17 of 46 positions shown; findings below may reference images not displayed]

FINDINGS: Lung bases clear.  Normal heart size.  No pericardial or
pleural effusion.  No hiatal hernia.  Previous cholecystectomy
noted.  Stable 16 mm hypodense lesion right hepatic lobe, image 25.
This has been previously imaged by MRI and found to represent a
hemangioma.  No other hepatic lesions.  No biliary dilatation.
Kidneys demonstrate no radiodense calculi, urinary tract
obstruction or hydronephrosis.  Ureters are decompressed and
symmetric.  Biliary system, pancreas, spleen, and adrenal glands
are within normal limits for noncontrast exam.  No bowel
dilatation, obstruction pattern, ileus, or free air.  In the right
lower quadrant, the terminal ileum and appendix are normal.
Minimal atherosclerosis of the aorta without aneurysm.
IMPRESSION: No acute intra abdominal finding
Previous cholecystectomy
Stable right hepatic lesion, as described

CT PELVIS
FINDINGS: Urinary bladder is moderately distended.  Previous
hysterectomy noted.  No pelvic free fluid, fluid collection,
abscess, hemorrhage, adenopathy or inguinal hernia.  No distal
bowel acute process.  Degenerative changes of the spine.
IMPRESSION: No acute intrapelvic finding by noncontrast CT.
Moderately distended urinary bladder

## 2011-05-07 IMAGING — CR DG ABDOMEN 2V
1 series · 1 of 1 positions shown · non-contrast
Comparison: CT abdomen and pelvis 04/03/2009

CLINICAL DATA: Vomiting.

ABDOMEN - 2 VIEW

[w abdomen decub]
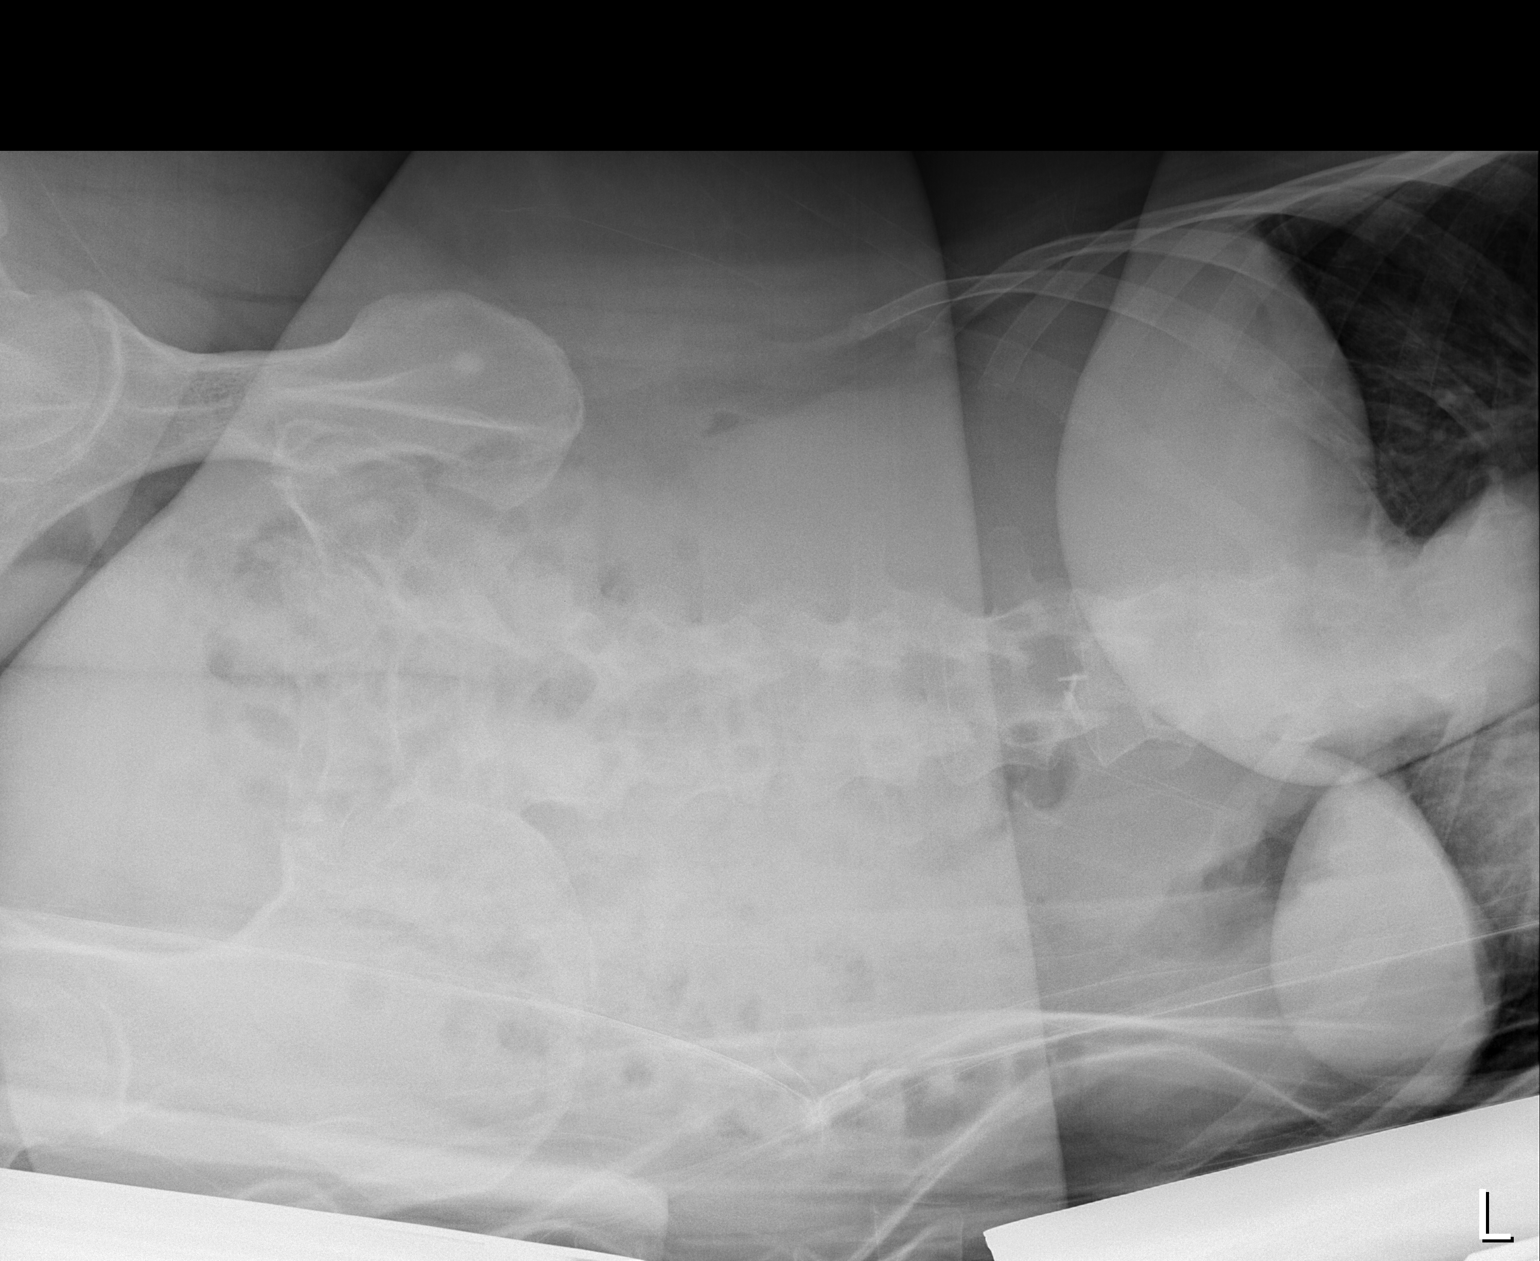

[1 of 1 positions shown; findings below may reference images not displayed]

FINDINGS: The abdominal bowel gas pattern is unremarkable.  The
soft tissue shadows are  maintained.  No worrisome calcifications.
No free air.  The bony structures are unremarkable.  Benign bone
island noted in the right iliac bone.
IMPRESSION: No plain film findings for acute abdominal process.

## 2011-05-23 ENCOUNTER — Encounter: Payer: Self-pay | Admitting: *Deleted

## 2011-05-23 ENCOUNTER — Inpatient Hospital Stay (HOSPITAL_COMMUNITY)
Admission: EM | Admit: 2011-05-23 | Discharge: 2011-05-27 | DRG: 690 | Disposition: A | Discharge status: Home Health | Payer: PRIVATE HEALTH INSURANCE | Attending: Internal Medicine | Admitting: Internal Medicine

## 2011-05-23 ENCOUNTER — Other Ambulatory Visit: Payer: Self-pay

## 2011-05-23 DIAGNOSIS — E1039 Type 1 diabetes mellitus with other diabetic ophthalmic complication: Secondary | ICD-10-CM | POA: Diagnosis present

## 2011-05-23 DIAGNOSIS — E1140 Type 2 diabetes mellitus with diabetic neuropathy, unspecified: Secondary | ICD-10-CM | POA: Insufficient documentation

## 2011-05-23 DIAGNOSIS — E039 Hypothyroidism, unspecified: Secondary | ICD-10-CM | POA: Insufficient documentation

## 2011-05-23 DIAGNOSIS — E860 Dehydration: Secondary | ICD-10-CM | POA: Diagnosis present

## 2011-05-23 DIAGNOSIS — R1110 Vomiting, unspecified: Secondary | ICD-10-CM | POA: Diagnosis present

## 2011-05-23 DIAGNOSIS — H548 Legal blindness, as defined in USA: Secondary | ICD-10-CM | POA: Diagnosis present

## 2011-05-23 DIAGNOSIS — E1142 Type 2 diabetes mellitus with diabetic polyneuropathy: Secondary | ICD-10-CM | POA: Diagnosis present

## 2011-05-23 DIAGNOSIS — Z794 Long term (current) use of insulin: Secondary | ICD-10-CM

## 2011-05-23 DIAGNOSIS — E1149 Type 2 diabetes mellitus with other diabetic neurological complication: Secondary | ICD-10-CM | POA: Diagnosis present

## 2011-05-23 DIAGNOSIS — E11319 Type 2 diabetes mellitus with unspecified diabetic retinopathy without macular edema: Secondary | ICD-10-CM | POA: Diagnosis present

## 2011-05-23 DIAGNOSIS — I959 Hypotension, unspecified: Secondary | ICD-10-CM | POA: Diagnosis present

## 2011-05-23 DIAGNOSIS — H547 Unspecified visual loss: Secondary | ICD-10-CM | POA: Diagnosis present

## 2011-05-23 DIAGNOSIS — N390 Urinary tract infection, site not specified: Principal | ICD-10-CM | POA: Diagnosis present

## 2011-05-23 DIAGNOSIS — K529 Noninfective gastroenteritis and colitis, unspecified: Secondary | ICD-10-CM | POA: Diagnosis present

## 2011-05-23 DIAGNOSIS — R1013 Epigastric pain: Secondary | ICD-10-CM | POA: Diagnosis present

## 2011-05-23 DIAGNOSIS — J45909 Unspecified asthma, uncomplicated: Secondary | ICD-10-CM | POA: Diagnosis present

## 2011-05-23 DIAGNOSIS — E1139 Type 2 diabetes mellitus with other diabetic ophthalmic complication: Secondary | ICD-10-CM | POA: Diagnosis present

## 2011-05-23 DIAGNOSIS — K5289 Other specified noninfective gastroenteritis and colitis: Secondary | ICD-10-CM | POA: Diagnosis present

## 2011-05-23 DIAGNOSIS — E1121 Type 2 diabetes mellitus with diabetic nephropathy: Secondary | ICD-10-CM | POA: Insufficient documentation

## 2011-05-23 DIAGNOSIS — R197 Diarrhea, unspecified: Secondary | ICD-10-CM | POA: Diagnosis present

## 2011-05-23 HISTORY — DX: Essential (primary) hypertension: I10

## 2011-05-23 HISTORY — DX: Hyperlipidemia, unspecified: E78.5

## 2011-05-23 HISTORY — DX: Hypothyroidism, unspecified: E03.9

## 2011-05-23 HISTORY — DX: Type 2 diabetes mellitus with diabetic neuropathy, unspecified: E11.40

## 2011-05-23 HISTORY — DX: Unspecified visual loss: H54.7

## 2011-05-23 LAB — URINALYSIS, ROUTINE W REFLEX MICROSCOPIC
Bilirubin Urine: NEGATIVE
Hgb urine dipstick: NEGATIVE
Nitrite: NEGATIVE
pH: 5 (ref 5.0–8.0)

## 2011-05-23 LAB — CBC
HCT: 35.7 % — ABNORMAL LOW (ref 36.0–46.0)
Hemoglobin: 11.8 g/dL — ABNORMAL LOW (ref 12.0–15.0)
MCH: 25.7 pg — ABNORMAL LOW (ref 26.0–34.0)
MCHC: 33.1 g/dL (ref 30.0–36.0)

## 2011-05-23 LAB — DIFFERENTIAL
Basophils Relative: 0 % (ref 0–1)
Eosinophils Absolute: 0.2 10*3/uL (ref 0.0–0.7)
Monocytes Absolute: 0.9 10*3/uL (ref 0.1–1.0)
Monocytes Relative: 9 % (ref 3–12)

## 2011-05-23 LAB — BASIC METABOLIC PANEL
BUN: 22 mg/dL (ref 6–23)
Chloride: 109 mEq/L (ref 96–112)
Creatinine, Ser: 1.03 mg/dL (ref 0.50–1.10)
GFR calc Af Amer: 71 mL/min — ABNORMAL LOW (ref >90)
GFR calc non Af Amer: 61 mL/min — ABNORMAL LOW (ref >90)

## 2011-05-23 LAB — LACTIC ACID, PLASMA: Lactic Acid, Venous: 1.9 mmol/L (ref 0.5–2.2)

## 2011-05-23 LAB — URINE MICROSCOPIC-ADD ON

## 2011-05-23 LAB — URINE CULTURE: Culture  Setup Time: 201301080138

## 2011-05-23 LAB — GLUCOSE, CAPILLARY

## 2011-05-23 MED ORDER — SODIUM CHLORIDE 0.9 % IV BOLUS (SEPSIS)
500.0000 mL | Freq: Once | INTRAVENOUS | Status: AC
  Administered 2011-05-23: 500 mL via INTRAVENOUS

## 2011-05-23 MED ORDER — SODIUM CHLORIDE 0.9 % IV SOLN
INTRAVENOUS | Status: DC
  Administered 2011-05-23 – 2011-05-27 (×6): via INTRAVENOUS

## 2011-05-23 MED ORDER — DEXTROSE 5 % IV SOLN
1.0000 g | Freq: Once | INTRAVENOUS | Status: AC
  Administered 2011-05-23: 1 g via INTRAVENOUS
  Filled 2011-05-23: qty 10

## 2011-05-23 MED ORDER — DEXTROSE 5 % IV SOLN
1.0000 g | INTRAVENOUS | Status: DC
  Filled 2011-05-23: qty 10

## 2011-05-23 NOTE — H&P (Signed)
History and Physical Examination  Date: 05/23/2011  Patient name: Amy [name] Medical record number: [name] Date of birth: November 02, 1960 Age: 51 y.o. Gender: female PCP: [name], MD, MD  Attending physician: Reyne Dumas  Chief Complaint:  Chief Complaint  Patient presents with  . Hypotension  . Diarrhea  . Emesis     History of Present Illness: Amy [name] is an 51 y.o. female who is reporting vomiting (X 1) and diarrhea (4 times a day, and not having an appetite.   She had been trying to drink but could not keep up with fluid losses from diarrhea.  No severe abdominal pain reported.  No chest pain but overall some malaise.  No fever reported.  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 No known sick contacts but patient spends a lot of time in her church around a large number of people.  Primary symptoms do not include fever. The illness began 3 to 5 days ago. The illness does not include chills, anorexia, dysphagia, bloating or back pain. Associated symptoms comments: Mild abdominal pain in the epigastric area.   The patient called EMS to her home where they found her to be hypotensive and provided her with boluses of normal saline fluid.  Slowly the patient began to have improved blood pressure after IV fluid hydration.  She also been experiencing palpitations and tachycardia. Hospital admission was requested for ongoing monitoring and treatment.   Past Medical History Past Medical History  Diagnosis Date  . Asthma   . Diabetes mellitus   . Hypertension   . Blind   . Hypothyroidism   . Hyperlipidemia   . Diabetic neuropathy     Past Surgical History Past Surgical History  Procedure Date  . Cesarean section   . Abdominal hysterectomy   . Cholecystectomy     Home Meds: Prior to Admission medications   Medication Sig Start Date End Date Taking? Authorizing Provider  albuterol (PROVENTIL HFA;VENTOLIN HFA) 108 (90  BASE) MCG/ACT inhaler Inhale 2 puffs into the lungs every 4 (four) hours as needed.     Yes Historical Provider, MD  aspirin 325 MG tablet Take 325 mg by mouth daily.     Yes Historical Provider, MD  insulin aspart (NOVOLOG) 100 UNIT/ML injection Inject 20 Units into the skin 3 (three) times daily before meals.     Yes Historical Provider, MD  insulin glargine (LANTUS) 100 UNIT/ML injection Inject 30 Units into the skin at bedtime.     Yes Historical Provider, MD  levothyroxine (SYNTHROID, LEVOTHROID) 200 MCG tablet Take 200 mcg by mouth daily.     Yes Historical Provider, MD  montelukast (SINGULAIR) 10 MG tablet Take 10 mg by mouth at bedtime.     Yes Historical Provider, MD  Naproxen Sodium 220 MG CAPS Take 2 capsules by mouth.     Yes Historical Provider, MD  pantoprazole (PROTONIX) 40 MG tablet Take 40 mg by mouth daily.     Yes Historical Provider, MD  potassium chloride SA (K-DUR,KLOR-CON) 20 MEQ tablet Take 20 mEq by mouth daily.    Yes Historical Provider, MD  pregabalin (LYRICA) 50 MG capsule Take 50 mg by mouth 2 (two) times daily.     Yes Historical Provider, MD  ramipril (ALTACE) 5 MG capsule Take 5 mg by mouth daily.     Yes Historical Provider, MD  simvastatin (ZOCOR) 20 MG tablet Take 20 mg by mouth every evening.  Yes Historical Provider, MD  vitamin B-12 (CYANOCOBALAMIN) 1000 MCG tablet Take 1,000 mcg by mouth daily.     Yes Historical Provider, MD    Allergies: Codeine; Iohexol; and Penicillins  Social History:  History   Social History  . Marital Status: Single    Spouse Name: N/A    Number of Children: N/A  . Years of Education: N/A   Occupational History  . Not on file.   Social History Main Topics  . Smoking status: Never Smoker   . Smokeless tobacco: Not on file  . Alcohol Use: No  . Drug Use: No  . Sexually Active:    Other Topics Concern  . Not on file   Social History Narrative  . No narrative on file   Family History: hypertension and diabetes  mellitus  Review of Systems: Pertinent items are noted in HPI. All other systems reviewed and reported as negative.   Physical Exam: Blood pressure 104/51, pulse 82, temperature 99.1 F (37.3 C), temperature source Oral, resp. rate 18, SpO2 97.00%. Constitutional: Pt is blind. She is oriented to person, place, and time. She appears well-developed and well-nourished.  HENT: Head: Normocephalic and atraumatic.  Eyes: Conjunctivae OK, OS with dense cataract, globe is atrophic, strabismus present Neck: Normal range of motion and phonation normal. Neck supple. No JVD. Cardiovascular: tachy, normal s1, s2, and intact distal pulses.  Pulmonary/Chest: Effort normal and breath sounds normal. She exhibits no tenderness.  Abdominal: Soft. She exhibits no distension. There is no tenderness. There is no guarding.  Musculoskeletal: Normal range of motion.  Neurological: She is alert and oriented to person, place, and time. She has normal strength and absent ankle jerk reflexes.  Skin: Skin is warm and dry.  Psychiatric: She has a normal mood and affect. Her behavior is normal. Judgment and thought content normal.    Lab  And Imaging results:  Results for orders placed during the hospital encounter of 05/23/11 (from the past 24 hour(s))  CBC     Status: Abnormal   Collection Time   05/23/11  4:20 PM      Component Value Range   WBC 10.4  4.0 - 10.5 (K/uL)   RBC 4.60  3.87 - 5.11 (MIL/uL)   Hemoglobin 11.8 (*) 12.0 - 15.0 (g/dL)   HCT 35.7 (*) 36.0 - 46.0 (%)   MCV 77.6 (*) 78.0 - 100.0 (fL)   MCH 25.7 (*) 26.0 - 34.0 (pg)   MCHC 33.1  30.0 - 36.0 (g/dL)   RDW 13.7  11.5 - 15.5 (%)   Platelets 234  150 - 400 (K/uL)  DIFFERENTIAL     Status: Normal   Collection Time   05/23/11  4:20 PM      Component Value Range   Neutrophils Relative 71  43 - 77 (%)   Neutro Abs 7.4  1.7 - 7.7 (K/uL)   Lymphocytes Relative 18  12 - 46 (%)   Lymphs Abs 1.9  0.7 - 4.0 (K/uL)   Monocytes Relative 9  3 - 12 (%)     Monocytes Absolute 0.9  0.1 - 1.0 (K/uL)   Eosinophils Relative 2  0 - 5 (%)   Eosinophils Absolute 0.2  0.0 - 0.7 (K/uL)   Basophils Relative 0  0 - 1 (%)   Basophils Absolute 0.0  0.0 - 0.1 (K/uL)  BASIC METABOLIC PANEL     Status: Abnormal   Collection Time   05/23/11  4:20 PM  Component Value Range   Sodium 142  135 - 145 (mEq/L)   Potassium 4.0  3.5 - 5.1 (mEq/L)   Chloride 109  96 - 112 (mEq/L)   CO2 23  19 - 32 (mEq/L)   Glucose, Bld 145 (*) 70 - 99 (mg/dL)   BUN 22  6 - 23 (mg/dL)   Creatinine, Ser 1.03  0.50 - 1.10 (mg/dL)   Calcium 9.1  8.4 - 10.5 (mg/dL)   GFR calc non Af Amer 61 (*) >90 (mL/min)   GFR calc Af Amer 71 (*) >90 (mL/min)  LACTIC ACID, PLASMA     Status: Normal   Collection Time   05/23/11  4:20 PM      Component Value Range   Lactic Acid, Venous 1.9  0.5 - 2.2 (mmol/L)  PROCALCITONIN     Status: Normal   Collection Time   05/23/11  4:20 PM      Component Value Range   Procalcitonin <0.10    URINALYSIS, ROUTINE W REFLEX MICROSCOPIC     Status: Abnormal   Collection Time   05/23/11  6:10 PM      Component Value Range   Color, Urine YELLOW  YELLOW    APPearance TURBID (*) CLEAR    Specific Gravity, Urine 1.019  1.005 - 1.030    pH 5.0  5.0 - 8.0    Glucose, UA NEGATIVE  NEGATIVE (mg/dL)   Hgb urine dipstick NEGATIVE  NEGATIVE    Bilirubin Urine NEGATIVE  NEGATIVE    Ketones, ur TRACE (*) NEGATIVE (mg/dL)   Protein, ur NEGATIVE  NEGATIVE (mg/dL)   Urobilinogen, UA 0.2  0.0 - 1.0 (mg/dL)   Nitrite NEGATIVE  NEGATIVE    Leukocytes, UA SMALL (*) NEGATIVE   URINE MICROSCOPIC-ADD ON     Status: Abnormal   Collection Time   05/23/11  6:10 PM      Component Value Range   Squamous Epithelial / LPF MANY (*) RARE    WBC, UA 7-10  <3 (WBC/hpf)   Bacteria, UA MANY (*) RARE    EKG Results:  Orders placed during the hospital encounter of 05/23/11  . ED EKG  . 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  Continue Rocephin IV for the urinary tract infection.  Continue saline IV.  Monitor blood glucose.  The patient will need to use receive Lantus tonight.  Will order a 20% lower dosage of basal insulin than she normally takes at home given she is vomiting and having diarrhea at this time.  She wants to try a diet we'll try a diet.  Will provide medications for nausea.  Stool studies pending.  Follow electrolytes.  Please see orders.    [place], [place] 05/23/2011, 8:07 PM

## 2011-05-23 NOTE — ED Notes (Signed)
Pt given ice chips per her request  Family at bedside to assist

## 2011-05-23 NOTE — ED Provider Notes (Signed)
History     CSN: [id number]  Arrival date & time 05/23/11  16   First MD Initiated Contact with Patient 05/23/11 1543      Chief Complaint  Patient presents with  . Hypotension  . Diarrhea  . Emesis    (Consider location/radiation/quality/duration/timing/severity/associated sxs/prior treatment) Patient is a 51 y.o. female presenting with diarrhea and vomiting. The history is provided by the patient and the EMS personnel.  Diarrhea The primary symptoms include vomiting (X 1) and diarrhea (4 times a day, color unk. d/t blindness). Primary symptoms do not include fever. The illness began 3 to 5 days ago.  The illness does not include chills, anorexia, dysphagia, bloating or back pain. Associated symptoms comments: Mild abdominal pain..  Emesis  Associated symptoms include diarrhea (4 times a day, color unk. d/t blindness). Pertinent negatives include no chills and no fever.  She was treated by EMS in the field for she was treated in the field by EMS, for hypotension. Her blood pressure improved.  Past Medical History  Diagnosis Date  . Asthma   . Diabetes mellitus   . Hypertension   . Blind   . Hypothyroidism   . Hyperlipidemia   . Diabetic neuropathy     Past Surgical History  Procedure Date  . Cesarean section     x 2  . Abdominal hysterectomy   . Cholecystectomy     History reviewed. No pertinent family history.  History  Substance Use Topics  . Smoking status: Former Smoker    Quit date: 05/22/1992  . Smokeless tobacco: Never Used  . Alcohol Use: No    OB History    Grav Para Term Preterm Abortions TAB SAB Ect Mult Living                  Review of Systems  Constitutional: Negative for fever and chills.  Gastrointestinal: Positive for vomiting (X 1) and diarrhea (4 times a day, color unk. d/t blindness). Negative for dysphagia, bloating and anorexia.  Musculoskeletal: Negative for back pain.  All other systems reviewed and are  negative.    Allergies  Codeine; Iohexol; and Penicillins  Home Medications   No current outpatient prescriptions on file.  BP 156/92  Pulse 88  Temp(Src) 97.9 F (36.6 C) (Oral)  Resp 16  Ht 4\' 11"  (1.499 m)  Wt 178 lb 12.7 oz (81.1 kg)  BMI 36.11 kg/m2  SpO2 98%  Physical Exam  Nursing note and vitals reviewed. Constitutional: She is oriented to person, place, and time. She appears well-developed and well-nourished.  HENT:  Head: Normocephalic and atraumatic.  Eyes: Conjunctivae and EOM are normal.       OS with dense cataract, globe is atrophic  Neck: Normal range of motion and phonation normal. Neck supple.  Cardiovascular: Normal rate, regular rhythm and intact distal pulses.   Pulmonary/Chest: Effort normal and breath sounds normal. She exhibits no tenderness.  Abdominal: Soft. She exhibits no distension. There is no tenderness. There is no guarding.  Musculoskeletal: Normal range of motion.  Neurological: She is alert and oriented to person, place, and time. She has normal strength and normal reflexes. She exhibits normal muscle tone.  Skin: Skin is warm and dry.  Psychiatric: She has a normal mood and affect. Her behavior is normal. Judgment and thought content normal.    ED Course  Procedures (including critical care time)  Date: 05/23/2011  Rate: 81  Rhythm: normal sinus rhythm  QRS Axis: normal  Intervals: normal  ST/T Wave abnormalities: normal  Conduction Disutrbances:none  Narrative Interpretation:   Old EKG Reviewed: none available      Labs Reviewed  CBC - Abnormal; Notable for the following:    Hemoglobin 11.8 (*)    HCT 35.7 (*)    MCV 77.6 (*)    MCH 25.7 (*)    All other components within normal limits  BASIC METABOLIC PANEL - Abnormal; Notable for the following:    Glucose, Bld 145 (*)    GFR calc non Af Amer 61 (*)    GFR calc Af Amer 71 (*)    All other components within normal limits  URINALYSIS, ROUTINE W REFLEX MICROSCOPIC -  Abnormal; Notable for the following:    APPearance TURBID (*)    Ketones, ur TRACE (*)    Leukocytes, UA SMALL (*)    All other components within normal limits  URINE MICROSCOPIC-ADD ON - Abnormal; Notable for the following:    Squamous Epithelial / LPF MANY (*)    Bacteria, UA MANY (*)    All other components within normal limits  GLUCOSE, CAPILLARY - Abnormal; Notable for the following:    Glucose-Capillary 139 (*)    All other components within normal limits  CBC - Abnormal; Notable for the following:    Hemoglobin 11.9 (*)    HCT 35.7 (*)    MCV 77.9 (*)    All other components within normal limits  CREATININE, SERUM - Abnormal; Notable for the following:    GFR calc non Af Amer 79 (*)    All other components within normal limits  TSH - Abnormal; Notable for the following:    TSH 0.021 (*)    All other components within normal limits  COMPREHENSIVE METABOLIC PANEL - Abnormal; Notable for the following:    Chloride 114 (*)    Glucose, Bld 62 (*)    Albumin 2.5 (*)    Total Bilirubin 0.1 (*)    All other components within normal limits  CBC - Abnormal; Notable for the following:    Hemoglobin 11.3 (*)    HCT 34.1 (*)    MCV 77.9 (*)    MCH 25.8 (*)    All other components within normal limits  HEMOGLOBIN A1C - Abnormal; Notable for the following:    Hemoglobin A1C 10.6 (*)    Mean Plasma Glucose 258 (*)    All other components within normal limits  TSH - Abnormal; Notable for the following:    TSH 0.025 (*)    All other components within normal limits  GLUCOSE, CAPILLARY - Abnormal; Notable for the following:    Glucose-Capillary 151 (*)    All other components within normal limits  T4, FREE - Abnormal; Notable for the following:    Free T4 1.98 (*)    All other components within normal limits  GLUCOSE, CAPILLARY - Abnormal; Notable for the following:    Glucose-Capillary 170 (*)    All other components within normal limits  CBC - Abnormal; Notable for the  following:    Hemoglobin 11.0 (*)    HCT 33.0 (*)    MCV 77.5 (*)    MCH 25.8 (*)    All other components within normal limits  COMPREHENSIVE METABOLIC PANEL - Abnormal; Notable for the following:    Glucose, Bld 257 (*)    Albumin 2.7 (*)    Total Bilirubin 0.2 (*)    All other components within normal limits  MAGNESIUM - Abnormal; Notable for the  following:    Magnesium 1.4 (*)    All other components within normal limits  GLUCOSE, CAPILLARY - Abnormal; Notable for the following:    Glucose-Capillary 174 (*)    All other components within normal limits  GLUCOSE, CAPILLARY - Abnormal; Notable for the following:    Glucose-Capillary 242 (*)    All other components within normal limits  DIFFERENTIAL  URINE CULTURE  LACTIC ACID, PLASMA  PROCALCITONIN  CLOSTRIDIUM DIFFICILE BY PCR  FECAL LACTOFERRIN  CARDIAC PANEL(CRET KIN+CKTOT+MB+TROPI)  CARDIAC PANEL(CRET KIN+CKTOT+MB+TROPI)  CARDIAC PANEL(CRET KIN+CKTOT+MB+TROPI)  MRSA PCR SCREENING  GLUCOSE, CAPILLARY  T3, FREE  PHOSPHORUS  VITAMIN B12  POCT CBG MONITORING  STOOL CULTURE  GRAM STAIN  OVA AND PARASITE EXAMINATION  POCT CBG MONITORING  RPR   No results found.   1. Hypotension   2. Vomiting   3. Urinary tract infection       MDM  Hypotension with Vomiting and UTI. Pt admitted        Richarda Blade, MD 05/25/11 1053

## 2011-05-23 NOTE — ED Notes (Signed)
Pt notified that urine and stool specimen needed, pt verbalized understanding and has call bell to notify staff. Unable to provide urine specimen at this time.

## 2011-05-23 NOTE — ED Notes (Signed)
Pt transferred to tcu from ed, pt able to use bed pan, liquid stool obtained, sent for cdiff, stool cx, o&p, lactoferin, sent to lab, pt currently with no needs, call light within reach

## 2011-05-23 NOTE — ED Notes (Signed)
Per ems: pt from home. Reports diarrhea x4days, vomiting x1, hypotension, initially 62pal, given 472ml NS, up to 76pal. Pt feels as though she may have a fever as well. A&Ox4

## 2011-05-24 LAB — COMPREHENSIVE METABOLIC PANEL
ALT: 22 U/L (ref 0–35)
CO2: 20 mEq/L (ref 19–32)
Calcium: 8.6 mg/dL (ref 8.4–10.5)
Creatinine, Ser: 0.7 mg/dL (ref 0.50–1.10)
GFR calc Af Amer: >90 mL/min (ref >90)
GFR calc non Af Amer: >90 mL/min (ref >90)
Glucose, Bld: 62 mg/dL — ABNORMAL LOW (ref 70–99)

## 2011-05-24 LAB — CARDIAC PANEL(CRET KIN+CKTOT+MB+TROPI)
CK, MB: 2 ng/mL (ref 0.3–4.0)
Relative Index: INVALID (ref 0.0–2.5)
Total CK: 53 U/L (ref 7–177)
Total CK: 40 U/L (ref 7–177)
Troponin I: <0.3 ng/mL (ref <0.30)

## 2011-05-24 LAB — CREATININE, SERUM: GFR calc Af Amer: >90 mL/min (ref >90)

## 2011-05-24 LAB — CBC
HCT: 35.7 % — ABNORMAL LOW (ref 36.0–46.0)
Hemoglobin: 11.3 g/dL — ABNORMAL LOW (ref 12.0–15.0)
MCH: 26 pg (ref 26.0–34.0)
MCH: 25.8 pg — ABNORMAL LOW (ref 26.0–34.0)
MCHC: 33.3 g/dL (ref 30.0–36.0)
MCV: 77.9 fL — ABNORMAL LOW (ref 78.0–100.0)
Platelets: 200 10*3/uL (ref 150–400)
RBC: 4.38 MIL/uL (ref 3.87–5.11)
RDW: 13.7 % (ref 11.5–15.5)
RDW: 13.7 % (ref 11.5–15.5)

## 2011-05-24 LAB — MRSA PCR SCREENING: MRSA by PCR: NEGATIVE

## 2011-05-24 LAB — GLUCOSE, CAPILLARY
Glucose-Capillary: 151 mg/dL — ABNORMAL HIGH (ref 70–99)
Glucose-Capillary: 170 mg/dL — ABNORMAL HIGH (ref 70–99)
Glucose-Capillary: 174 mg/dL — ABNORMAL HIGH (ref 70–99)

## 2011-05-24 LAB — FECAL LACTOFERRIN, QUANT

## 2011-05-24 LAB — T4, FREE: Free T4: 1.98 ng/dL — ABNORMAL HIGH (ref 0.80–1.80)

## 2011-05-24 LAB — TSH: TSH: 0.025 u[IU]/mL — ABNORMAL LOW (ref 0.350–4.500)

## 2011-05-24 MED ORDER — ENOXAPARIN SODIUM 40 MG/0.4ML ~~LOC~~ SOLN
40.0000 mg | SUBCUTANEOUS | Status: DC
  Administered 2011-05-24 – 2011-05-27 (×4): 40 mg via SUBCUTANEOUS
  Filled 2011-05-24 (×5): qty 0.4

## 2011-05-24 MED ORDER — LEVOTHYROXINE SODIUM 150 MCG PO TABS
150.0000 ug | ORAL_TABLET | Freq: Every day | ORAL | Status: DC
  Administered 2011-05-25 – 2011-05-27 (×3): 150 ug via ORAL
  Filled 2011-05-24 (×4): qty 1

## 2011-05-24 MED ORDER — ASPIRIN 325 MG PO TABS
325.0000 mg | ORAL_TABLET | Freq: Every day | ORAL | Status: DC
  Administered 2011-05-24 – 2011-05-27 (×4): 325 mg via ORAL
  Filled 2011-05-24 (×5): qty 1

## 2011-05-24 MED ORDER — INSULIN GLARGINE 100 UNIT/ML ~~LOC~~ SOLN
24.0000 [IU] | Freq: Every day | SUBCUTANEOUS | Status: DC
  Administered 2011-05-24 – 2011-05-25 (×2): 24 [IU] via SUBCUTANEOUS
  Filled 2011-05-24: qty 3

## 2011-05-24 MED ORDER — CIPROFLOXACIN IN D5W 400 MG/200ML IV SOLN
400.0000 mg | Freq: Two times a day (BID) | INTRAVENOUS | Status: DC
  Administered 2011-05-24 – 2011-05-27 (×6): 400 mg via INTRAVENOUS
  Filled 2011-05-24 (×8): qty 200

## 2011-05-24 MED ORDER — SIMVASTATIN 20 MG PO TABS
20.0000 mg | ORAL_TABLET | Freq: Every evening | ORAL | Status: DC
  Administered 2011-05-24 – 2011-05-27 (×4): 20 mg via ORAL
  Filled 2011-05-24 (×5): qty 1

## 2011-05-24 MED ORDER — INSULIN ASPART 100 UNIT/ML ~~LOC~~ SOLN
4.0000 [IU] | Freq: Three times a day (TID) | SUBCUTANEOUS | Status: DC
  Administered 2011-05-24 – 2011-05-26 (×5): 4 [IU] via SUBCUTANEOUS
  Filled 2011-05-24: qty 3

## 2011-05-24 MED ORDER — INSULIN ASPART 100 UNIT/ML ~~LOC~~ SOLN
0.0000 [IU] | Freq: Three times a day (TID) | SUBCUTANEOUS | Status: DC
  Administered 2011-05-24 (×2): 2 [IU] via SUBCUTANEOUS
  Administered 2011-05-25 (×2): 5 [IU] via SUBCUTANEOUS
  Administered 2011-05-26: 1 [IU] via SUBCUTANEOUS
  Administered 2011-05-26: 2 [IU] via SUBCUTANEOUS
  Administered 2011-05-27: 1 [IU] via SUBCUTANEOUS

## 2011-05-24 MED ORDER — POTASSIUM CHLORIDE CRYS ER 20 MEQ PO TBCR
20.0000 meq | EXTENDED_RELEASE_TABLET | Freq: Every day | ORAL | Status: DC
  Administered 2011-05-24 – 2011-05-27 (×4): 20 meq via ORAL
  Filled 2011-05-24 (×5): qty 1

## 2011-05-24 MED ORDER — RAMIPRIL 5 MG PO CAPS
5.0000 mg | ORAL_CAPSULE | Freq: Every day | ORAL | Status: DC
  Administered 2011-05-24: 5 mg via ORAL
  Filled 2011-05-24: qty 1

## 2011-05-24 MED ORDER — PREGABALIN 75 MG PO CAPS
75.0000 mg | ORAL_CAPSULE | Freq: Two times a day (BID) | ORAL | Status: DC
  Administered 2011-05-24 – 2011-05-27 (×6): 75 mg via ORAL
  Filled 2011-05-24 (×6): qty 1

## 2011-05-24 MED ORDER — MONTELUKAST SODIUM 10 MG PO TABS
10.0000 mg | ORAL_TABLET | Freq: Every day | ORAL | Status: DC
  Administered 2011-05-24 – 2011-05-25 (×2): 10 mg via ORAL
  Filled 2011-05-24 (×5): qty 1

## 2011-05-24 MED ORDER — METRONIDAZOLE IN NACL 5-0.79 MG/ML-% IV SOLN
500.0000 mg | Freq: Three times a day (TID) | INTRAVENOUS | Status: DC
  Administered 2011-05-24 – 2011-05-26 (×6): 500 mg via INTRAVENOUS
  Filled 2011-05-24 (×9): qty 100

## 2011-05-24 MED ORDER — PANTOPRAZOLE SODIUM 40 MG PO TBEC
40.0000 mg | DELAYED_RELEASE_TABLET | Freq: Every day | ORAL | Status: DC
  Administered 2011-05-24 – 2011-05-27 (×4): 40 mg via ORAL
  Filled 2011-05-24 (×5): qty 1

## 2011-05-24 MED ORDER — LEVOTHYROXINE SODIUM 200 MCG PO TABS
200.0000 ug | ORAL_TABLET | Freq: Every day | ORAL | Status: DC
  Administered 2011-05-24: 200 ug via ORAL
  Filled 2011-05-24: qty 1

## 2011-05-24 MED ORDER — ALBUTEROL SULFATE HFA 108 (90 BASE) MCG/ACT IN AERS: 2.0000 | INHALATION_SPRAY | RESPIRATORY_TRACT | Status: DC | PRN

## 2011-05-24 MED ORDER — ACETAMINOPHEN 325 MG PO TABS
650.0000 mg | ORAL_TABLET | Freq: Four times a day (QID) | ORAL | Status: DC | PRN
  Administered 2011-05-24 – 2011-05-25 (×4): 650 mg via ORAL
  Filled 2011-05-24: qty 1
  Filled 2011-05-24 (×3): qty 2
  Filled 2011-05-24: qty 1

## 2011-05-24 NOTE — Progress Notes (Addendum)
Inpatient Diabetes Program Recommendations  AACE/ADA: New Consensus Statement on Inpatient Glycemic Control (2009)  Target Ranges:  Prepandial:   less than 140 mg/dL      Peak postprandial:   less than 180 mg/dL (1-2 hours)      Critically ill patients:  140 - 180 mg/dL   Reason for Visit: Elevated HgBA1C at 10.6%  Inpatient Diabetes Program Recommendations Insulin - Basal: Lantus should be sufficient at 35-40 units per day. Insulin - Meal Coverage: Would recommend that patient should not need more than 10 units Novolog per meal if eating appropriately.  Note: Pt may be taking too much Novolog meal coverage causing her to be hungry and eating too much.  May need RD for carb counting consult ed for home nutrition program. Larrie Kass, CNS [phone]

## 2011-05-24 NOTE — ED Notes (Signed)
Report give to Amy [name], pt to be transferred to 1426

## 2011-05-24 NOTE — Progress Notes (Signed)
UR completed 

## 2011-05-24 NOTE — Progress Notes (Signed)
Amy [name] is a 51 y.o. female patient admitted with orthostatic hypotension and diarrhea. She denies fever. Review of her labs shows consistently low tsh on high dose of synthroid. Wonder how much this may be contributing to the diarrhea. She feels better this evening.    Past Medical History  Diagnosis Date  . Asthma   . Diabetes mellitus   . Hypertension   . Blind   . Hypothyroidism   . Hyperlipidemia   . Diabetic neuropathy    Current Facility-Administered Medications  Medication Dose Route Frequency Provider Last Rate Last Dose  . 0.9 %  sodium chloride infusion   Intravenous Continuous Richarda Blade, MD 125 mL/hr at 05/24/11 1433    . acetaminophen (TYLENOL) tablet 650 mg  650 mg Oral Q6H PRN Peter Le   650 mg at 05/24/11 0806  . albuterol (PROVENTIL HFA;VENTOLIN HFA) 108 (90 BASE) MCG/ACT inhaler 2 puff  2 puff Inhalation Q4H PRN Clanford L Johnson, MD      . aspirin tablet 325 mg  325 mg Oral Daily Clanford Marisa Hua, MD   325 mg at 05/24/11 1003  . cefTRIAXone (ROCEPHIN) 1 g in dextrose 5 % 50 mL IVPB  1 g Intravenous Once Richarda Blade, MD   1 g at 05/23/11 1952  . ciprofloxacin (CIPRO) IVPB 400 mg  400 mg Intravenous Q12H Kiele Heavrin   400 mg at 05/24/11 1630  . enoxaparin (LOVENOX) injection 40 mg  40 mg Subcutaneous Q24H Clanford Marisa Hua, MD   40 mg at 05/24/11 0756  . insulin aspart (novoLOG) injection 0-9 Units  0-9 Units Subcutaneous TID WC Clanford Marisa Hua, MD   2 Units at 05/24/11 1710  . insulin aspart (novoLOG) injection 4 Units  4 Units Subcutaneous TID PC Clanford Marisa Hua, MD   4 Units at 05/24/11 1714  . insulin glargine (LANTUS) injection 24 Units  24 Units Subcutaneous QHS Clanford L Johnson, MD      . levothyroxine (SYNTHROID, LEVOTHROID) tablet 150 mcg  150 mcg Oral QAC breakfast Gizella [place]      . metroNIDAZOLE (FLAGYL) IVPB 500 mg  500 mg Intravenous Q8H Anela Bensman   500 mg at 05/24/11 1630  . montelukast (SINGULAIR) tablet 10 mg  10 mg  Oral QHS Clanford L Johnson, MD      . pantoprazole (PROTONIX) EC tablet 40 mg  40 mg Oral QAC lunch Clanford Marisa Hua, MD   40 mg at 05/24/11 0807  . potassium chloride SA (K-DUR,KLOR-CON) CR tablet 20 mEq  20 mEq Oral Daily Clanford Marisa Hua, MD   20 mEq at 05/24/11 1004  . pregabalin (LYRICA) capsule 75 mg  75 mg Oral BID Clanford Marisa Hua, MD   75 mg at 05/24/11 1003  . simvastatin (ZOCOR) tablet 20 mg  20 mg Oral QPM Clanford L Johnson, MD   20 mg at 05/24/11 1710  . DISCONTD: cefTRIAXone (ROCEPHIN) 1 g in dextrose 5 % 50 mL IVPB  1 g Intravenous Q24H Clanford L Johnson, MD      . DISCONTD: levothyroxine (SYNTHROID, LEVOTHROID) tablet 200 mcg  200 mcg Oral QAC breakfast Clanford Marisa Hua, MD   200 mcg at 05/24/11 0756  . DISCONTD: ramipril (ALTACE) capsule 5 mg  5 mg Oral Daily Clanford Marisa Hua, MD   5 mg at 05/24/11 1003   Allergies  Allergen Reactions  . Codeine     Gets sick  . Iohexol      Code: HIVES, Desc:  pt developed itching and hives along with nasal congestion; needs 13 hour premeds for future studies, Onset Date: [id number]   . Penicillins     Gets sick   Principal Problem:  *Gastroenteritis Active Problems:  Hypotension  Dehydration  Blindness  DM type 1 causing eye disease, not at goal  Asthma  Diarrhea  Vomiting  Hypothyroidism  Diabetic neuropathy  UTI (lower urinary tract infection)  Diabetic nephropathy   Vital signs in last 24 hours: Temp:  [97.9 F (36.6 C)-99.1 F (37.3 C)] 98.2 F (36.8 C) (01/08 1348) Pulse Rate:  [74-93] 84  (01/08 1700) Resp:  [18-27] 20  (01/08 1348) BP: (98-144)/(51-95) 144/85 mmHg (01/08 1700) SpO2:  [95 %-100 %] 97 % (01/08 1348) FiO2 (%):  [0 %] 0 % (01/07 2355) Weight:  [81.1 kg (178 lb 12.7 oz)] 178 lb 12.7 oz (81.1 kg) (01/08 0025) Weight change:  Last BM Date: 05/23/11  Intake/Output from previous day: 01/07 0701 - 01/08 0700 In: -  Out: 100 [Urine:100] Intake/Output this shift: Total I/O In: 760  [P.O.:760] Out: 902 [Urine:900; Stool:2]  Lab Results:  [facility] 05/24/11 0453 05/24/11 0118  WBC 7.9 9.1  HGB 11.3* 11.9*  HCT 34.1* 35.7*  PLT 209 200   BMET  Basename 05/24/11 0453 05/24/11 0118 05/23/11 1620  NA 143 -- 142  K 3.9 -- 4.0  CL 114* -- 109  CO2 20 -- 23  GLUCOSE 62* -- 145*  BUN 15 -- 22  CREATININE 0.70 0.84 --  CALCIUM 8.6 -- 9.1    Studies/Results: No results found.  Medications: I have reviewed the patient's current medications.   Physical exam GENERAL- alert HEAD- normal atraumatic, no neck masses, normal thyroid, no jvd RESPIRATORY- appears well, vitals normal, no respiratory distress, acyanotic, normal RR, ear and throat exam is normal, neck free of mass or lymphadenopathy, chest clear, no wheezing, crepitations, rhonchi, normal symmetric air entry CVS- regular rate and rhythm, S1, S2 normal, no murmur, click, rub or gallop ABDOMEN- abdomen is soft without significant tenderness, masses, organomegaly or guarding NEURO- Grossly normal EXTREMITIES- extremities normal, atraumatic, no cyanosis or edema  Plan 1. Diarrhea with severe dehydration resulting in orthostatic hypotension- gastroenteritis versus exogenous hyperthyroidism- IVF/Abx/reduce synthroid. Hold acei. Follow stool studies. 2. DM2 with complications- hb [id number] better than  4 months ago but still high. Continue current regimen. 3.  Hypothyroidism- lower synthroid. Recheck tsh in ?6 weeks. 4.  BA - stable.    [name] 05/24/2011 6:57 PM Pager: [phone].

## 2011-05-24 NOTE — Progress Notes (Signed)
INITIAL ADULT NUTRITION ASSESSMENT Date: 05/24/2011   Time: 2:20 PM Reason for Assessment: consult  ASSESSMENT: Female 51 y.o.  Dx: Gastroenteritis  Hx:  Past Medical History  Diagnosis Date  . Asthma   . Diabetes mellitus   . Hypertension   . Blind   . Hypothyroidism   . Hyperlipidemia   . Diabetic neuropathy    Past Surgical History  Procedure Date  . Cesarean section     x 2  . Abdominal hysterectomy   . Cholecystectomy     Related Meds:  Scheduled Meds:   . aspirin  325 mg Oral Daily  . cefTRIAXone (ROCEPHIN)  IV  1 g Intravenous Once  . cefTRIAXone (ROCEPHIN)  IV  1 g Intravenous Q24H  . enoxaparin  40 mg Subcutaneous Q24H  . insulin aspart  0-9 Units Subcutaneous TID WC  . insulin aspart  4 Units Subcutaneous TID PC  . insulin glargine  24 Units Subcutaneous QHS  . levothyroxine  200 mcg Oral QAC breakfast  . montelukast  10 mg Oral QHS  . pantoprazole  40 mg Oral QAC lunch  . potassium chloride SA  20 mEq Oral Daily  . pregabalin  75 mg Oral BID  . ramipril  5 mg Oral Daily  . simvastatin  20 mg Oral QPM  . sodium chloride  500 mL Intravenous Once   Continuous Infusions:   . sodium chloride 125 mL/hr at 05/24/11 0551   PRN Meds:.acetaminophen, albuterol   Ht: 4\' 11"  (149.9 cm)  Wt: 178 lb 12.7 oz (81.1 kg) (standing scale A)  Ideal Wt: 43.2 kg 47.7 kg % Ideal Wt: 45 kg  Usual Wt: unable to assess % Usual Wt:   Body mass index is 36.11 kg/(m^2).  Food/Nutrition Related Hx:  Pt admitted with diarrhea, hypotension.  Pt reported several stools per day PTA.  Pt stool positive for lactoferrin.  Pt diet advanced to CHO Mod Med, pt consumed 100% of lunch meal. Pt has had 2 stools so far today.  Labs:  CMP     Component Value Date/Time   NA 143 05/24/2011 0453   K 3.9 05/24/2011 0453   CL 114* 05/24/2011 0453   CO2 20 05/24/2011 0453   GLUCOSE 62* 05/24/2011 0453   BUN 15 05/24/2011 0453   CREATININE 0.70 05/24/2011 0453   CALCIUM 8.6 05/24/2011 0453   PROT 6.1 05/24/2011 0453   ALBUMIN 2.5* 05/24/2011 0453   AST 18 05/24/2011 0453   ALT 22 05/24/2011 0453   ALKPHOS 88 05/24/2011 0453   BILITOT 0.1* 05/24/2011 0453   GFRNONAA >90 05/24/2011 0453   GFRAA >90 05/24/2011 0453    CBC    Component Value Date/Time   WBC 7.9 05/24/2011 0453   RBC 4.38 05/24/2011 0453   HGB 11.3* 05/24/2011 0453   HCT 34.1* 05/24/2011 0453   PLT 209 05/24/2011 0453   MCV 77.9* 05/24/2011 0453   MCH 25.8* 05/24/2011 0453   MCHC 33.1 05/24/2011 0453   RDW 13.7 05/24/2011 0453   LYMPHSABS 1.9 05/23/2011 1620   MONOABS 0.9 05/23/2011 1620   EOSABS 0.2 05/23/2011 1620   BASOSABS 0.0 05/23/2011 1620    Intake: 100% of lunch Output:   Intake/Output Summary (Last 24 hours) at 05/24/11 1423 Last data filed at 05/24/11 1350  Gross per 24 hour  Intake    520 ml  Output    502 ml  Net     18 ml     Diet Order: Carb Control  Supplements/Tube Feeding: none at this time  IVF:    sodium chloride Last Rate: 125 mL/hr at 05/24/11 0551    Estimated Nutritional Needs:   Kcal: 1510-1730 kcal Protein: 64-75 g Fluid: ~2.0 L/day  NUTRITION DIAGNOSIS: -Altered GI function; diarrhea (NI-1.4).  Status: Ongoing  RELATED TO: inflamation  AS EVIDENCE BY: pt with 4 stools per day PTA  MONITORING/EVALUATION(Goals): 1.  Food/Beverage; pt continues to consume >75% of meals 2.  Gastrointestinal; pt with 3 or fewer stools per day  EDUCATION NEEDS: -Education needs not appropriate at this time.  INTERVENTION: 1.  General diet; pt consumed 100% of lunch this afternoon with tolerance.  Now resting comfortably.  Noted pt with gastritis, positive lactoferrin. Encourage intake with resolve of diarrhea. RD to follow and provide education for long-term diet therapy if warranted.  Dietitian [phone]  DOCUMENTATION CODES Per approved criteria  -Obesity Unspecified    Brynda Greathouse Sue-Ellen 05/24/2011, 2:20 PM

## 2011-05-25 LAB — CBC
HCT: 33 % — ABNORMAL LOW (ref 36.0–46.0)
MCHC: 33.3 g/dL (ref 30.0–36.0)
MCV: 77.5 fL — ABNORMAL LOW (ref 78.0–100.0)
Platelets: 207 10*3/uL (ref 150–400)
RDW: 13.5 % (ref 11.5–15.5)

## 2011-05-25 LAB — STOOL CULTURE

## 2011-05-25 LAB — RPR: RPR Ser Ql: NONREACTIVE

## 2011-05-25 LAB — OVA AND PARASITE EXAMINATION: Ova and parasites: NONE SEEN

## 2011-05-25 LAB — COMPREHENSIVE METABOLIC PANEL
Albumin: 2.7 g/dL — ABNORMAL LOW (ref 3.5–5.2)
BUN: 7 mg/dL (ref 6–23)
Creatinine, Ser: 0.57 mg/dL (ref 0.50–1.10)
Total Protein: 6.2 g/dL (ref 6.0–8.3)

## 2011-05-25 LAB — MAGNESIUM: Magnesium: 1.4 mg/dL — ABNORMAL LOW (ref 1.5–2.5)

## 2011-05-25 LAB — PHOSPHORUS: Phosphorus: 2.6 mg/dL (ref 2.3–4.6)

## 2011-05-25 LAB — VITAMIN B12: Vitamin B-12: 325 pg/mL (ref 211–911)

## 2011-05-25 MED ORDER — OXYCODONE-ACETAMINOPHEN 5-325 MG PO TABS
1.0000 | ORAL_TABLET | Freq: Four times a day (QID) | ORAL | Status: DC | PRN
  Administered 2011-05-25: 1 via ORAL
  Filled 2011-05-25: qty 1

## 2011-05-25 MED ORDER — ONDANSETRON HCL 4 MG/2ML IJ SOLN
4.0000 mg | Freq: Three times a day (TID) | INTRAMUSCULAR | Status: DC | PRN
  Administered 2011-05-25 – 2011-05-26 (×2): 4 mg via INTRAVENOUS
  Filled 2011-05-25: qty 2

## 2011-05-25 MED ORDER — ONDANSETRON HCL 4 MG/2ML IJ SOLN
INTRAMUSCULAR | Status: AC
  Filled 2011-05-25: qty 2

## 2011-05-25 NOTE — Progress Notes (Signed)
DAILY PROGRESS NOTE                              [facility]AL [facility]  SUBJECTIVE: Feels better, denies any complaints.BJECTIVE: BP 108/67  Pulse 73  Temp(Src) 98.6 F (37 C) (Oral)  Resp 18  Ht 4\' 11"  (1.499 m)  Wt 81.1 kg (178 lb 12.7 oz)  BMI 36.11 kg/m2  SpO2 96%  Intake/Output Summary (Last 24 hours) at 05/25/11 1503 Last data filed at 05/25/11 1300  Gross per 24 hour  Intake 6101.25 ml  Output   3078 ml  Net 3023.25 ml                      Weight change:  Physical Exam: General: Alert and awake oriented x3 not in any acute distress. HEENT: anicteric sclera, pupils equal reactive to light and accommodation CVS: S1-S2 heard, no murmur rubs or gallops Chest: clear to auscultation bilaterally, no wheezing rales or rhonchi Abdomen:  normal bowel sounds, soft, nontender, nondistended, no organomegaly Neuro: Cranial nerves II-XII intact, no focal neurological deficits Extremities: no cyanosis, no clubbing or edema noted bilaterally   Lab Results:  Basename 05/25/11 0525 05/24/11 0453  NA 138 143  K 3.8 3.9  CL 109 114*  CO2 22 20  GLUCOSE 257* 62*  BUN 7 15  CREATININE 0.57 0.70  CALCIUM 8.5 8.6  MG 1.4* --  PHOS 2.6 --    Basename 05/25/11 0525 05/24/11 0453  AST 16 18  ALT 23 22  ALKPHOS 91 88  BILITOT 0.2* 0.1*  PROT 6.2 6.1  ALBUMIN 2.7* 2.5*   No results found for this basename: LIPASE:2,AMYLASE:2 in the last 72 hours  Basename 05/25/11 0525 05/24/11 0453 05/23/11 1620  WBC 6.8 7.9 --  NEUTROABS -- -- 7.4  HGB 11.0* 11.3* --  HCT 33.0* 34.1* --  MCV 77.5* 77.9* --  PLT 207 209 --    Basename 05/24/11 1559 05/24/11 0755 05/24/11 0118  CKTOTAL 40 53 36  CKMB 2.0 2.0 1.6  CKMBINDEX -- -- --  TROPONINI <0.30 <0.30 <0.30   No components found with this basename: POCBNP:3 No results found for this basename: DDIMER:2 in the last 72 hours  Basename 05/24/11 0118  HGBA1C 10.6*   No results found for this basename:  CHOL:2,HDL:2,LDLCALC:2,TRIG:2,CHOLHDL:2,LDLDIRECT:2 in the last 72 hours  Basename 05/24/11 1600 05/24/11 0453  TSH -- 0.025*  T4TOTAL -- --  T3FREE 2.9 --  THYROIDAB -- --    Basename 05/25/11 0525  VITAMINB12 325  FOLATE --  FERRITIN --  TIBC --  IRON --  RETICCTPCT --    Micro Results: Recent Results (from the past 240 hour(s))  URINE CULTURE     Status: Normal   Collection Time   05/23/11  6:10 PM      Component Value Range Status Comment   Specimen Description URINE, CLEAN CATCH   Final    Special Requests Normal   Final    Setup Time [id number]   Final    Colony Count >=100,000 COLONIES/ML   Final    Culture     Final    Value: Multiple bacterial morphotypes present, none predominant. Suggest appropriate recollection if clinically indicated.   Report Status 05/24/2011 FINAL   Final   CLOSTRIDIUM DIFFICILE BY PCR     Status: Normal   Collection Time   05/23/11 11:46 PM      Component  Value Range Status Comment   C difficile by pcr NEGATIVE  NEGATIVE  Final   MRSA PCR SCREENING     Status: Normal   Collection Time   05/24/11 12:49 AM      Component Value Range Status Comment   MRSA by PCR NEGATIVE  NEGATIVE  Final     Studies/Results: No results found. Medications: Scheduled Meds:   . aspirin  325 mg Oral Daily  . ciprofloxacin  400 mg Intravenous Q12H  . enoxaparin  40 mg Subcutaneous Q24H  . insulin aspart  0-9 Units Subcutaneous TID WC  . insulin aspart  4 Units Subcutaneous TID PC  . insulin glargine  24 Units Subcutaneous QHS  . levothyroxine  150 mcg Oral QAC breakfast  . metronidazole  500 mg Intravenous Q8H  . montelukast  10 mg Oral QHS  . pantoprazole  40 mg Oral QAC lunch  . potassium chloride SA  20 mEq Oral Daily  . pregabalin  75 mg Oral BID  . simvastatin  20 mg Oral QPM   Continuous Infusions:   . sodium chloride 75 mL/hr at 05/25/11 1406   PRN Meds:.acetaminophen, albuterol  ASSESSMENT & PLAN: Principal Problem:   *Gastroenteritis Active Problems:  Hypotension  Dehydration  Blindness  DM type 1 causing eye disease, not at goal  Asthma  Diarrhea  Vomiting  Hypothyroidism  Diabetic neuropathy  UTI (lower urinary tract infection)  Diabetic nephropathy   1. Diarrhea: This is likely secondary to gastroenteritis. Patient treated symptomatically with antibiotics and the IV fluids. She is a much better. Denies any nausea/vomiting or diarrhea today.  2. UTI: Patient does have more than 100,000 colony bed. Did not go specific organism.   3. IDDM: Uncontrolled with hemoglobin A1c is 10.6. Continue the current treatment.  4. Hypothyroidism: Decrease Synthroid dose because of the low TSH. Recheck in 6 weeks.  5. Dehydration: This is secondary to gastroenteritis and diarrhea. This is resolved.   LOS: 2 days   Naidelyn Parrella A 05/25/2011, 3:03 PM

## 2011-05-25 NOTE — Progress Notes (Signed)
Inpatient Diabetes Program Recommendations  AACE/ADA: New Consensus Statement on Inpatient Glycemic Control (2009)  Target Ranges:  Prepandial:   less than 140 mg/dL      Peak postprandial:   less than 180 mg/dL (1-2 hours)      Critically ill patients:  140 - 180 mg/dL   Reason for Visit: Persistent hyperglycemia  Inpatient Diabetes Program Recommendations Insulin - Basal: Increase to 30 units at HS and continue to increase until fasting cbg's are less than 150 mg/dL Insulin - Meal Coverage: Increae meal coverage to 8 units tidwc. Pt needing approx 5 units correction in addition to meal coveage of 4 units and still in 200's range.  Note: Rosita Kea, RN, CNS, Diabetes Coordinator [phone])

## 2011-05-25 NOTE — Progress Notes (Signed)
05-25-11 Spoke with patient at bedside. Lives alone. PCP: Dr Criss Rosales. Friend checks on her frequently. Has aide assistance through [facility] services provided by [facility] for [facility] RN, PT, OT services. Will need rollator, 3 in 1, and tub bench. Chose [facility] for DME. Will fax orders to [facility] at fax number [phone]. No further needs assessed. Amy [name] states her friend will pick her up upon dc. Therefore, she requests DME be delivered to her home. Of note, she has not had a rw in the past. [name] with [facility]/DME aware of referral. [facility] also aware of impending dc. [address], RN,BSN, [place]

## 2011-05-26 ENCOUNTER — Inpatient Hospital Stay (HOSPITAL_COMMUNITY): Payer: PRIVATE HEALTH INSURANCE

## 2011-05-26 LAB — GLUCOSE, CAPILLARY: Glucose-Capillary: 208 mg/dL — ABNORMAL HIGH (ref 70–99)

## 2011-05-26 MED ORDER — MORPHINE SULFATE 2 MG/ML IJ SOLN
1.0000 mg | INTRAMUSCULAR | Status: DC | PRN
  Administered 2011-05-26 (×2): 1 mg via INTRAVENOUS
  Filled 2011-05-26 (×2): qty 1

## 2011-05-26 MED ORDER — INSULIN GLARGINE 100 UNIT/ML ~~LOC~~ SOLN
30.0000 [IU] | Freq: Every day | SUBCUTANEOUS | Status: DC
  Administered 2011-05-26: 30 [IU] via SUBCUTANEOUS

## 2011-05-26 MED ORDER — ONDANSETRON HCL 4 MG/2ML IJ SOLN
4.0000 mg | Freq: Four times a day (QID) | INTRAMUSCULAR | Status: DC | PRN
  Administered 2011-05-26: 4 mg via INTRAVENOUS
  Filled 2011-05-26 (×2): qty 2

## 2011-05-26 MED ORDER — PROMETHAZINE HCL 25 MG/ML IJ SOLN
12.5000 mg | Freq: Four times a day (QID) | INTRAMUSCULAR | Status: DC | PRN
Start: 2011-05-26 — End: 2011-05-27
  Administered 2011-05-26 – 2011-05-27 (×2): 12.5 mg via INTRAVENOUS
  Filled 2011-05-26 (×2): qty 1

## 2011-05-26 MED ORDER — INSULIN ASPART 100 UNIT/ML ~~LOC~~ SOLN
8.0000 [IU] | Freq: Three times a day (TID) | SUBCUTANEOUS | Status: DC
  Administered 2011-05-27: 8 [IU] via SUBCUTANEOUS

## 2011-05-26 NOTE — Progress Notes (Signed)
DAILY PROGRESS NOTE                              GENERAL [facility]NAL [facility]IC[facility]  SUBJECTIVE: Vomited last night after she started on the Percocet. She had vomiting this morning also.  SUBJECTIVE: BP 163/92  Pulse 87  Temp(Src) 98.1 F (36.7 C) (Oral)  Resp 18  Ht 4\' 11"  (1.499 m)  Wt 81.1 kg (178 lb 12.7 oz)  BMI 36.11 kg/m2  SpO2 94%  Intake/Output Summary (Last 24 hours) at 05/26/11 1051 Last data filed at 05/26/11 0900  Gross per 24 hour  Intake   1804 ml  Output   2192 ml  Net   -388 ml                      Weight change:  Physical Exam: General: Alert and awake oriented x3 not in any acute distress. HEENT: anicteric sclera, pupils equal reactive to light and accommodation CVS: S1-S2 heard, no murmur rubs or gallops Chest: clear to auscultation bilaterally, no wheezing rales or rhonchi Abdomen:  normal bowel sounds, soft, nontender, nondistended, no organomegaly Neuro: Cranial nerves II-XII intact, no focal neurological deficits Extremities: no cyanosis, no clubbing or edema noted bilaterally   Lab Results:  Basename 05/25/11 0525 05/24/11 0453  NA 138 143  K 3.8 3.9  CL 109 114*  CO2 22 20  GLUCOSE 257* 62*  BUN 7 15  CREATININE 0.57 0.70  CALCIUM 8.5 8.6  MG 1.4* --  PHOS 2.6 --    [facility] 05/25/11 0525 05/24/11 0453  AST 16 18  ALT 23 22  ALKPHOS 91 88  BILITOT 0.2* 0.1*  PROT 6.2 6.1  ALBUMIN 2.7* 2.5*   Basename 05/25/11 0525 05/24/11 0453 05/23/11 1620  WBC 6.8 7.9 --  NEUTROABS -- -- 7.4  HGB 11.0* 11.3* --  HCT 33.0* 34.1* --  MCV 77.5* 77.9* --  PLT 207 209 --    Basename 05/24/11 1559 05/24/11 0755 05/24/11 0118  CKTOTAL 40 53 36  CKMB 2.0 2.0 1.6  CKMBINDEX -- -- --  TROPONINI <0.30 <0.30 <0.30   Basename 05/24/11 0118  HGBA1C 10.6*   Basename 05/24/11 1600 05/24/11 0453  TSH -- 0.025*  T4TOTAL -- --  T3FREE 2.9 --  THYROIDAB -- --    Studies/Results: No results found. Medications: Scheduled Meds:     . aspirin  325 mg Oral Daily  . ciprofloxacin  400 mg Intravenous Q12H  . enoxaparin  40 mg Subcutaneous Q24H  . insulin aspart  0-9 Units Subcutaneous TID WC  . insulin aspart  4 Units Subcutaneous TID PC  . insulin glargine  24 Units Subcutaneous QHS  . levothyroxine  150 mcg Oral QAC breakfast  . metronidazole  500 mg Intravenous Q8H  . montelukast  10 mg Oral QHS  . ondansetron      . pantoprazole  40 mg Oral QAC lunch  . potassium chloride SA  20 mEq Oral Daily  . pregabalin  75 mg Oral BID  . simvastatin  20 mg Oral QPM   Continuous Infusions:    . sodium chloride 75 mL/hr at 05/26/11 0700   PRN Meds:.acetaminophen, albuterol, ondansetron (ZOFRAN) IV, oxyCODONE-acetaminophen  ASSESSMENT & PLAN: Principal Problem:  *Gastroenteritis Active Problems:  Hypotension  Dehydration  Blindness  DM type 1 causing eye disease, not at goal  Asthma  Diarrhea  Vomiting  Hypothyroidism  Diabetic neuropathy  UTI (lower urinary tract infection)  Diabetic nephropathy   1. Diarrhea: This is likely secondary to gastroenteritis. Patient treated symptomatically with antibiotics and the IV fluids. She is a much better. Denies any nausea/vomiting or diarrhea today. Had vomiting last night and now, over 5 episodes we'll keep her today in the hospital we'll discontinue the narcotics and increasing Zofran for nausea.  2. UTI: Patient does have more than 100,000 colony bed. Did not go specific organism.   3. IDDM: Uncontrolled with hemoglobin A1c is 10.6. I will we'll increase the Lantus to 30 units in the prandial coverage to 8 units.  4. Hypothyroidism: Decrease Synthroid dose because of the low TSH. Recheck in 6 weeks.  5. Dehydration: This is secondary to gastroenteritis and diarrhea. This is resolved.   LOS: 3 days   Jenin Birdsall A 05/26/2011, 10:51 AM

## 2011-05-27 LAB — GLUCOSE, CAPILLARY
Glucose-Capillary: 90 mg/dL (ref 70–99)
Glucose-Capillary: 71 mg/dL (ref 70–99)

## 2011-05-27 MED ORDER — CIPROFLOXACIN HCL 500 MG PO TABS: 500.0000 mg | ORAL_TABLET | Freq: Two times a day (BID) | ORAL | Status: AC

## 2011-05-27 NOTE — Discharge Summary (Signed)
HOSPITAL DISCHARGE SUMMARY  [name]  MRN: [name]  DOB:[birth year]  Date of Admission: 05/23/2011 Date of Discharge: 05/27/2011         LOS: 4 days   Attending Physician:Chioke Noxon A  Patient's VB:[id number] J, MD, MD  Consults:  None  Discharge Diagnoses: Present on Admission:  .Gastroenteritis .Hypotension .Dehydration .Blindness .DM type 1 causing eye disease, not at goal .Diarrhea .Vomiting .UTI (lower urinary tract infection)   Current Discharge Medication List    START taking these medications   Details  ciprofloxacin (CIPRO) 500 MG tablet Take 1 tablet (500 mg total) by mouth 2 (two) times daily. Qty: 8 tablet, Refills: 0      CONTINUE these medications which have NOT CHANGED   Details  albuterol (PROVENTIL HFA;VENTOLIN HFA) 108 (90 BASE) MCG/ACT inhaler Inhale 2 puffs into the lungs every 4 (four) hours as needed.      aspirin 325 MG tablet Take 325 mg by mouth daily.      insulin aspart (NOVOLOG) 100 UNIT/ML injection Inject 20 Units into the skin 3 (three) times daily before meals.      insulin glargine (LANTUS) 100 UNIT/ML injection Inject 30 Units into the skin at bedtime.      levothyroxine (SYNTHROID, LEVOTHROID) 200 MCG tablet Take 200 mcg by mouth daily.      montelukast (SINGULAIR) 10 MG tablet Take 10 mg by mouth at bedtime.      Naproxen Sodium 220 MG CAPS Take 2 capsules by mouth.      pantoprazole (PROTONIX) 40 MG tablet Take 40 mg by mouth daily.      potassium chloride SA (K-DUR,KLOR-CON) 20 MEQ tablet Take 20 mEq by mouth daily.     pregabalin (LYRICA) 50 MG capsule Take 50 mg by mouth 2 (two) times daily.      ramipril (ALTACE) 5 MG capsule Take 5 mg by mouth daily.      Saxagliptin-Metformin (KOMBIGLYZE XR) 5-500 MG TB24 Take 1 tablet by mouth daily.      simvastatin (ZOCOR) 20 MG tablet Take 20 mg by mouth every evening.      vitamin B-12 (CYANOCOBALAMIN) 1000 MCG tablet Take 1,000 mcg by mouth daily.            Brief Admission History: Amy [name] is an 51 y.o. female who is reporting vomiting (X 1) and diarrhea (4 times a day, and not having an appetite. She had been trying to drink but could not keep up with fluid losses from diarrhea. No severe abdominal pain reported. No chest pain but overall some malaise. No fever reported. 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 No known sick contacts but patient spends a lot of time in her church around a large number of people. Primary symptoms do not include fever. The illness began 3 to 5 days ago. The illness does not include chills, anorexia, dysphagia, bloating or back pain. Associated symptoms comments: Mild abdominal pain in the epigastric area. The patient called EMS to her home where they found her to be hypotensive and provided her with boluses of normal saline fluid. Slowly the patient began to have improved blood pressure after IV fluid hydration. She also been experiencing palpitations and tachycardia. Hospital admission was requested for ongoing monitoring and treatment.  Hospital Course: Present on Admission:  .Gastroenteritis .Hypotension .Dehydration .Blindness .DM type 1 causing eye disease, not at goal .Diarrhea .Vomiting .UTI (lower urinary tract infection)  1. Nausea/vomiting/diarrhea: This is likely  transient gastroenteritis. When patient came into the hospital she was hypotensive and now blood pressure was normalized with boluses of normal saline. Diarrhea tested negative for C. difficile, culture, and ova and parasites. This was managed conservatively with treating symptoms with antiemetics. This is resolved the time of discharge in the dehydration also from that resolved.  2. UTI: When patient came in she had acute urinalysis consistent with UTI. The patient was started on ciprofloxacin. The culture grew more than 100K colonies but it was multiple organisms. The cause patient  does have diabetes I treated her empirically for UTI with ciprofloxacin for 7 days.  3. Insulin-dependent DM: Patient is on 30 units of Lantus and NovoLog insulin. Her diabetes is uncontrolled this hemoglobin A1c of 10.6. Patient is getting much better than October of 06/22/2010 at that time her hemoglobin A1c was 12.8.  4. Blindness: Patient is legally blind because of her diabetes and retinopathy. Patient she does have some help at home. Home health service was set up because of her generalized weakness secondary to that dehydration and diarrhea. Patient to followup with her PCP next week.  Day of Discharge BP 125/84  Pulse 74  Temp(Src) 98.8 F (37.1 C) (Oral)  Resp 18  Ht 4\' 11"  (1.499 m)  Wt 81.1 kg (178 lb 12.7 oz)  BMI 36.11 kg/m2  SpO2 95% Physical Exam: GEN: No acute distress, cooperative with exam PSYCH: He is alert and oriented x4; does not appear anxious does not appear depressed; affect is normal  HEENT: Mucous membranes pink and anicteric;  Mouth: without oral thrush or lesions Eyes: PERRLA; EOM intact;  Neck: no cervical lymphadenopathy nor thyromegaly or carotid bruit; no JVD;  CHEST WALL: No tenderness, symmetrical to breathing bilaterally CHEST: Normal respiration, clear to auscultation bilaterally  HEART: Regular rate and rhythm; no murmurs, rubs or gallops, S1 and S2 heard  BACK: No kyphosis or scoliosis; no CVA tenderness  ABDOMEN:  soft non-tender; no masses, no organomegaly, normal abdominal bowel sounds; no pannus; no intertriginous candida.  EXTREMITIES: No bone or joint deformity; no edema; no ulcerations.  PULSES: 2+ and symmetric, neurovascularity is intact SKIN: Normal hydration no rash or ulceration, no flushing or suspicious lesions  CNS: Cranial nerves 2-12 grossly intact no focal neurologic deficit, coordination is intact gait not tested    Results for orders placed during the hospital encounter of 05/23/11 (from the past 24 hour(s))  GLUCOSE,  CAPILLARY     Status: Normal   Collection Time   05/26/11  5:15 PM      Component Value Range   Glucose-Capillary 90  70 - 99 (mg/dL)  GLUCOSE, CAPILLARY     Status: Abnormal   Collection Time   05/26/11  8:33 PM      Component Value Range   Glucose-Capillary 208 (*) 70 - 99 (mg/dL)  GLUCOSE, CAPILLARY     Status: Abnormal   Collection Time   05/27/11  7:47 AM      Component Value Range   Glucose-Capillary 111 (*) 70 - 99 (mg/dL)  GLUCOSE, CAPILLARY     Status: Abnormal   Collection Time   05/27/11 12:05 PM      Component Value Range   Glucose-Capillary 138 (*) 70 - 99 (mg/dL)    Disposition:  Home with home health services.   Follow-up Appts: Discharge Orders    Future Orders Please Complete By Expires   Diet Carb Modified      Increase activity slowly  Follow-up Information    Follow up with [name], MD. Schedule an appointment as soon as possible for a visit in 1 week.   Contact information:   [id number] [place] [phone]       Follow up with [facility] . ([place] RN, PT, OT services)    Contact information:   [address], [place]         I spent 40 minutes completing paperwork and coordinating discharge efforts.  SignedVerlee Monte A 05/27/2011, 3:12 PM

## 2011-08-13 ENCOUNTER — Emergency Department (HOSPITAL_COMMUNITY): Payer: PRIVATE HEALTH INSURANCE

## 2011-08-13 ENCOUNTER — Encounter (HOSPITAL_COMMUNITY): Payer: Self-pay | Admitting: *Deleted

## 2011-08-13 ENCOUNTER — Inpatient Hospital Stay (HOSPITAL_COMMUNITY)
Admission: EM | Admit: 2011-08-13 | Discharge: 2011-08-16 | DRG: 074 | Disposition: A | Discharge status: Home / Self Care | Payer: PRIVATE HEALTH INSURANCE | Attending: Internal Medicine | Admitting: Internal Medicine

## 2011-08-13 ENCOUNTER — Other Ambulatory Visit: Payer: Self-pay

## 2011-08-13 ENCOUNTER — Other Ambulatory Visit (HOSPITAL_COMMUNITY): Payer: Self-pay | Admitting: Pharmacy Technician

## 2011-08-13 DIAGNOSIS — E1139 Type 2 diabetes mellitus with other diabetic ophthalmic complication: Secondary | ICD-10-CM | POA: Diagnosis present

## 2011-08-13 DIAGNOSIS — E785 Hyperlipidemia, unspecified: Secondary | ICD-10-CM | POA: Diagnosis present

## 2011-08-13 DIAGNOSIS — Z794 Long term (current) use of insulin: Secondary | ICD-10-CM

## 2011-08-13 DIAGNOSIS — R1110 Vomiting, unspecified: Secondary | ICD-10-CM

## 2011-08-13 DIAGNOSIS — E876 Hypokalemia: Secondary | ICD-10-CM | POA: Diagnosis not present

## 2011-08-13 DIAGNOSIS — E1142 Type 2 diabetes mellitus with diabetic polyneuropathy: Secondary | ICD-10-CM | POA: Diagnosis present

## 2011-08-13 DIAGNOSIS — M545 Low back pain, unspecified: Secondary | ICD-10-CM | POA: Diagnosis present

## 2011-08-13 DIAGNOSIS — B370 Candidal stomatitis: Secondary | ICD-10-CM | POA: Diagnosis present

## 2011-08-13 DIAGNOSIS — A088 Other specified intestinal infections: Secondary | ICD-10-CM | POA: Diagnosis present

## 2011-08-13 DIAGNOSIS — R079 Chest pain, unspecified: Secondary | ICD-10-CM | POA: Diagnosis present

## 2011-08-13 DIAGNOSIS — E039 Hypothyroidism, unspecified: Secondary | ICD-10-CM | POA: Diagnosis present

## 2011-08-13 DIAGNOSIS — K3184 Gastroparesis: Secondary | ICD-10-CM | POA: Diagnosis present

## 2011-08-13 DIAGNOSIS — E11319 Type 2 diabetes mellitus with unspecified diabetic retinopathy without macular edema: Secondary | ICD-10-CM | POA: Diagnosis present

## 2011-08-13 DIAGNOSIS — I10 Essential (primary) hypertension: Secondary | ICD-10-CM | POA: Diagnosis present

## 2011-08-13 DIAGNOSIS — K529 Noninfective gastroenteritis and colitis, unspecified: Secondary | ICD-10-CM | POA: Diagnosis present

## 2011-08-13 DIAGNOSIS — H543 Unqualified visual loss, both eyes: Secondary | ICD-10-CM | POA: Diagnosis present

## 2011-08-13 DIAGNOSIS — E119 Type 2 diabetes mellitus without complications: Secondary | ICD-10-CM | POA: Diagnosis present

## 2011-08-13 DIAGNOSIS — M1990 Unspecified osteoarthritis, unspecified site: Secondary | ICD-10-CM | POA: Diagnosis present

## 2011-08-13 DIAGNOSIS — F411 Generalized anxiety disorder: Secondary | ICD-10-CM | POA: Diagnosis present

## 2011-08-13 DIAGNOSIS — E1149 Type 2 diabetes mellitus with other diabetic neurological complication: Principal | ICD-10-CM | POA: Diagnosis present

## 2011-08-13 LAB — POCT I-STAT TROPONIN I: Troponin i, poc: 0 ng/mL (ref 0.00–0.08)

## 2011-08-13 LAB — BASIC METABOLIC PANEL
Calcium: 9.3 mg/dL (ref 8.4–10.5)
GFR calc non Af Amer: 82 mL/min — ABNORMAL LOW (ref >90)
Sodium: 144 mEq/L (ref 135–145)

## 2011-08-13 LAB — TROPONIN I: Troponin I: <0.3 ng/mL (ref <0.30)

## 2011-08-13 LAB — CBC
MCH: 25.6 pg — ABNORMAL LOW (ref 26.0–34.0)
MCHC: 33.3 g/dL (ref 30.0–36.0)
Platelets: 213 10*3/uL (ref 150–400)
RBC: 5.39 MIL/uL — ABNORMAL HIGH (ref 3.87–5.11)

## 2011-08-13 LAB — PRO B NATRIURETIC PEPTIDE: Pro B Natriuretic peptide (BNP): 158.3 pg/mL — ABNORMAL HIGH (ref 0–125)

## 2011-08-13 MED ORDER — INSULIN GLARGINE 100 UNIT/ML ~~LOC~~ SOLN
15.0000 [IU] | Freq: Every day | SUBCUTANEOUS | Status: DC
  Administered 2011-08-14 – 2011-08-15 (×2): 15 [IU] via SUBCUTANEOUS

## 2011-08-13 MED ORDER — ACETAMINOPHEN 650 MG RE SUPP: 650.0000 mg | Freq: Four times a day (QID) | RECTAL | Status: DC | PRN

## 2011-08-13 MED ORDER — DIPHENHYDRAMINE HCL 50 MG/ML IJ SOLN
12.5000 mg | Freq: Once | INTRAMUSCULAR | Status: AC
  Administered 2011-08-13: 12.5 mg via INTRAVENOUS
  Filled 2011-08-13: qty 1

## 2011-08-13 MED ORDER — CIPROFLOXACIN IN D5W 400 MG/200ML IV SOLN
400.0000 mg | Freq: Two times a day (BID) | INTRAVENOUS | Status: DC
  Administered 2011-08-14: 400 mg via INTRAVENOUS
  Filled 2011-08-13 (×2): qty 200

## 2011-08-13 MED ORDER — LEVOTHYROXINE SODIUM 100 MCG IV SOLR
100.0000 ug | Freq: Every day | INTRAVENOUS | Status: DC
  Administered 2011-08-14 – 2011-08-15 (×2): 100 ug via INTRAVENOUS
  Filled 2011-08-13 (×2): qty 5

## 2011-08-13 MED ORDER — ONDANSETRON HCL 4 MG/2ML IJ SOLN
4.0000 mg | Freq: Once | INTRAMUSCULAR | Status: AC
  Administered 2011-08-13: 4 mg via INTRAVENOUS
  Filled 2011-08-13: qty 2

## 2011-08-13 MED ORDER — HYDROMORPHONE HCL PF 1 MG/ML IJ SOLN
1.0000 mg | Freq: Once | INTRAMUSCULAR | Status: AC
  Administered 2011-08-13: 1 mg via INTRAVENOUS
  Filled 2011-08-13: qty 1

## 2011-08-13 MED ORDER — HYDROMORPHONE HCL PF 1 MG/ML IJ SOLN
0.5000 mg | INTRAMUSCULAR | Status: DC | PRN
  Administered 2011-08-14: 0.5 mg via INTRAVENOUS
  Filled 2011-08-13: qty 1

## 2011-08-13 MED ORDER — ENOXAPARIN SODIUM 40 MG/0.4ML ~~LOC~~ SOLN
40.0000 mg | SUBCUTANEOUS | Status: DC
  Administered 2011-08-14 – 2011-08-16 (×3): 40 mg via SUBCUTANEOUS
  Filled 2011-08-13 (×4): qty 0.4

## 2011-08-13 MED ORDER — SODIUM CHLORIDE 0.9 % IV SOLN
1000.0000 mL | INTRAVENOUS | Status: DC
  Administered 2011-08-14: 1000 mL via INTRAVENOUS
  Administered 2011-08-14: 250 mL via INTRAVENOUS

## 2011-08-13 MED ORDER — METOCLOPRAMIDE HCL 5 MG/ML IJ SOLN: 10.0000 mg | Freq: Once | INTRAMUSCULAR | Status: DC

## 2011-08-13 MED ORDER — FAMOTIDINE IN NACL 20-0.9 MG/50ML-% IV SOLN
20.0000 mg | Freq: Two times a day (BID) | INTRAVENOUS | Status: DC
  Administered 2011-08-14 – 2011-08-16 (×6): 20 mg via INTRAVENOUS
  Filled 2011-08-13 (×9): qty 50

## 2011-08-13 MED ORDER — ONDANSETRON HCL 4 MG PO TABS: 4.0000 mg | ORAL_TABLET | Freq: Four times a day (QID) | ORAL | Status: DC | PRN

## 2011-08-13 MED ORDER — SODIUM CHLORIDE 0.9 % IJ SOLN
3.0000 mL | Freq: Two times a day (BID) | INTRAMUSCULAR | Status: DC
  Administered 2011-08-14 – 2011-08-16 (×2): 3 mL via INTRAVENOUS

## 2011-08-13 MED ORDER — SODIUM CHLORIDE 0.9 % IV SOLN
1000.0000 mL | Freq: Once | INTRAVENOUS | Status: AC
  Administered 2011-08-13: 1000 mL via INTRAVENOUS

## 2011-08-13 MED ORDER — IPRATROPIUM BROMIDE 0.02 % IN SOLN: 0.5000 mg | RESPIRATORY_TRACT | Status: DC | PRN

## 2011-08-13 MED ORDER — ACETAMINOPHEN 325 MG PO TABS
650.0000 mg | ORAL_TABLET | Freq: Four times a day (QID) | ORAL | Status: DC | PRN
  Administered 2011-08-15: 650 mg via ORAL
  Filled 2011-08-13: qty 2

## 2011-08-13 MED ORDER — METRONIDAZOLE IN NACL 5-0.79 MG/ML-% IV SOLN
500.0000 mg | Freq: Three times a day (TID) | INTRAVENOUS | Status: DC
  Administered 2011-08-14 (×2): 500 mg via INTRAVENOUS
  Filled 2011-08-13 (×3): qty 100

## 2011-08-13 MED ORDER — ONDANSETRON HCL 4 MG/2ML IJ SOLN
4.0000 mg | Freq: Four times a day (QID) | INTRAMUSCULAR | Status: DC | PRN
  Administered 2011-08-14 (×3): 4 mg via INTRAVENOUS
  Filled 2011-08-13 (×4): qty 2

## 2011-08-13 MED ORDER — VITAMIN B-12 1000 MCG PO TABS
1000.0000 ug | ORAL_TABLET | Freq: Every day | ORAL | Status: DC
  Administered 2011-08-14 – 2011-08-16 (×3): 1000 ug via ORAL
  Filled 2011-08-13 (×4): qty 1

## 2011-08-13 MED ORDER — ALBUTEROL SULFATE (5 MG/ML) 0.5% IN NEBU: 2.5000 mg | INHALATION_SOLUTION | RESPIRATORY_TRACT | Status: DC | PRN

## 2011-08-13 MED ORDER — PREGABALIN 50 MG PO CAPS
50.0000 mg | ORAL_CAPSULE | Freq: Two times a day (BID) | ORAL | Status: DC
  Administered 2011-08-14 – 2011-08-16 (×5): 50 mg via ORAL
  Filled 2011-08-13 (×6): qty 1

## 2011-08-13 MED ORDER — FLUCONAZOLE 100MG IVPB
100.0000 mg | INTRAVENOUS | Status: DC
  Administered 2011-08-14 – 2011-08-16 (×3): 100 mg via INTRAVENOUS
  Filled 2011-08-13 (×4): qty 50

## 2011-08-13 MED ORDER — INSULIN ASPART 100 UNIT/ML ~~LOC~~ SOLN
0.0000 [IU] | SUBCUTANEOUS | Status: DC
  Administered 2011-08-14 (×2): 2 [IU] via SUBCUTANEOUS
  Administered 2011-08-14: 5 [IU] via SUBCUTANEOUS
  Administered 2011-08-14 – 2011-08-15 (×3): 1 [IU] via SUBCUTANEOUS
  Administered 2011-08-15 – 2011-08-16 (×2): 2 [IU] via SUBCUTANEOUS
  Administered 2011-08-16: 1 [IU] via SUBCUTANEOUS

## 2011-08-13 NOTE — ED Provider Notes (Deleted)
Medical screening examination/treatment/procedure(s) were performed by non-physician practitioner and as supervising physician I was immediately available for consultation/collaboration.   Kathalene Frames, MD 08/13/11 [phone]

## 2011-08-13 NOTE — H&P (Signed)
PCP:   [name], MD, MD   Chief Complaint:  Vomiting, diarrhea, chest pain.  HPI: This is a 51 year old female, with known history of Insulin-requiring type 2 DM, HTN, dyslipidemia, hypothyroidism, anxiety, panic attacks, depression, gastroparesis, diabetic retinopathy and blindness, osteoarthritis, s/p Cholecystectomy, s/p hysterectomy and cataract surgery, presenting with above symptoms. According to patient, her symptoms started 3 days ago, and she has had vomiting about 3 times daily, and watery diarrhea about 4-5 times per day, associated with crampy bilateral lower quadrant pain. She denies fever, chill, recent travel, sick contacts or ingestion of unusual foods. Patient also, has had retrosternal pain, radiating to her back, intermitent over the past 3 days, without cough, or shortness of breath.  Allergies:   Allergies  Allergen Reactions  . Codeine     Gets sick  . Iohexol      Code: HIVES, Desc: pt developed itching and hives along with nasal congestion; needs 13 hour premeds for future studies, Onset Date: [id number]   . Penicillins     Gets sick      Past Medical History  Diagnosis Date  . Asthma   . Diabetes mellitus   . Hypertension   . Blind   . Hypothyroidism   . Hyperlipidemia   . Diabetic neuropathy     Past Surgical History  Procedure Date  . Cesarean section     x 2  . Abdominal hysterectomy   . Cholecystectomy     Prior to Admission medications   Medication Sig Start Date End Date Taking? Authorizing Provider  albuterol (PROVENTIL HFA;VENTOLIN HFA) 108 (90 BASE) MCG/ACT inhaler Inhale 2 puffs into the lungs every 4 (four) hours as needed. For shortness of breath   Yes Historical Provider, MD  aspirin 325 MG tablet Take 325 mg by mouth daily.     Yes Historical Provider, MD  insulin aspart (NOVOLOG) 100 UNIT/ML injection Inject 20 Units into the skin 3 (three) times daily before meals.     Yes Historical Provider, MD  insulin glargine (LANTUS) 100  UNIT/ML injection Inject 30 Units into the skin at bedtime.     Yes Historical Provider, MD  levothyroxine (SYNTHROID, LEVOTHROID) 200 MCG tablet Take 200 mcg by mouth daily.     Yes Historical Provider, MD  montelukast (SINGULAIR) 10 MG tablet Take 10 mg by mouth at bedtime.     Yes Historical Provider, MD  Naproxen Sodium 220 MG CAPS Take 2 capsules by mouth.     Yes Historical Provider, MD  pantoprazole (PROTONIX) 40 MG tablet Take 40 mg by mouth daily.     Yes Historical Provider, MD  potassium chloride SA (K-DUR,KLOR-CON) 20 MEQ tablet Take 20 mEq by mouth daily.    Yes Historical Provider, MD  pregabalin (LYRICA) 50 MG capsule Take 50 mg by mouth 2 (two) times daily.     Yes Historical Provider, MD  ramipril (ALTACE) 5 MG capsule Take 5 mg by mouth daily.     Yes Historical Provider, MD  Saxagliptin-Metformin (KOMBIGLYZE XR) 5-500 MG TB24 Take 1 tablet by mouth daily.     Yes Historical Provider, MD  simvastatin (ZOCOR) 20 MG tablet Take 20 mg by mouth every evening.     Yes Historical Provider, MD  vitamin B-12 (CYANOCOBALAMIN) 1000 MCG tablet Take 1,000 mcg by mouth daily.     Yes Historical Provider, MD  dextromethorphan-guaiFENesin (ROBITUSSIN-DM) 10-100 MG/5ML liquid Take 5 mLs by mouth every 4 (four) hours as needed. For cold/flu symptom relief  Historical Provider, MD    Social History: Patient worked at [employer], but no longer, since January 2013. She lives alone, has a home heath aide, has a son. She reports that she quit smoking about 19 years ago. She has never used smokeless tobacco. She reports that she does not drink alcohol or use illicit drugs.  History reviewed. No pertinent family history. Patient is adopted.  Review of Systems:  As per HPI and chief complaint. Patent denies fatigue, weight loss, fever, chills, headache, difficulty in speaking, dysphagia, cough, shortness of breath, orthopnea, paroxysmal nocturnal dyspnea, nausea, diaphoresis, belching,  heartburn, hematemesis, melena, dysuria, nocturia, urinary frequency, hematochezia, lower extremity swelling, pain, or redness. The rest of the systems review is negative.  Physical Exam:  General:  Patient is alert, communicative, fully oriented, talking in complete sentences, not short of breath at rest.  HEENT:  No clinical pallor, no jaundice, no conjunctival injection or discharge. Visible buccal mucosa is "dry", and oral thrush is evident. NECK:  Supple, JVP not seen, no carotid bruits, no palpable lymphadenopathy, no palpable goiter. CHEST:  Clinically clear to auscultation, no wheezes, no crackles. Has localized sternal chest wall tenderness. HEART:  Sounds 1 and 2 heard, normal, regular, no murmurs. ABDOMEN:  Moderately obese, soft, moderately tender in RLQ, no palpable organomegaly, no palpable masses, normal bowel sounds. GENITALIA:  Not examined. LOWER EXTREMITIES:  No pitting edema, palpable peripheral pulses. MUSCULOSKELETAL SYSTEM:  Unremarkable. CENTRAL NERVOUS SYSTEM:  No focal neurologic deficit on gross examination.  Labs on Admission:  Results for orders placed during the hospital encounter of 08/13/11 (from the past 48 hour(s))  CBC     Status: Abnormal   Collection Time   08/13/11  5:30 PM      Component Value Range Comment   WBC 15.9 (*) 4.0 - 10.5 (K/uL)    RBC 5.39 (*) 3.87 - 5.11 (MIL/uL)    Hemoglobin 13.8  12.0 - 15.0 (g/dL)    HCT 41.4  36.0 - 46.0 (%)    MCV 76.8 (*) 78.0 - 100.0 (fL)    MCH 25.6 (*) 26.0 - 34.0 (pg)    MCHC 33.3  30.0 - 36.0 (g/dL)    RDW 14.3  11.5 - 15.5 (%)    Platelets 213  150 - 400 (K/uL) RESULT REPEATED AND VERIFIED  BASIC METABOLIC PANEL     Status: Abnormal   Collection Time   08/13/11  5:30 PM      Component Value Range Comment   Sodium 144  135 - 145 (mEq/L)    Potassium 4.0  3.5 - 5.1 (mEq/L)    Chloride 108  96 - 112 (mEq/L)    CO2 27  19 - 32 (mEq/L)    Glucose, Bld 133 (*) 70 - 99 (mg/dL)    BUN 20  6 - 23 (mg/dL)     Creatinine, Ser 0.81  0.50 - 1.10 (mg/dL)    Calcium 9.3  8.4 - 10.5 (mg/dL)    GFR calc non Af Amer 82 (*) >90 (mL/min)    GFR calc Af Amer >90  >90 (mL/min)   PRO B NATRIURETIC PEPTIDE     Status: Abnormal   Collection Time   08/13/11  5:30 PM      Component Value Range Comment   Pro B Natriuretic peptide (BNP) 158.3 (*) 0 - 125 (pg/mL)   TROPONIN I     Status: Normal   Collection Time   08/13/11  5:47 PM  Component Value Range Comment   Troponin I <0.30  <0.30 (ng/mL)   GLUCOSE, CAPILLARY     Status: Abnormal   Collection Time   08/13/11  6:46 PM      Component Value Range Comment   Glucose-Capillary 107 (*) 70 - 99 (mg/dL)   LIPASE, BLOOD     Status: Normal   Collection Time   08/13/11  7:56 PM      Component Value Range Comment   Lipase 18  11 - 59 (U/L)   POCT I-STAT TROPONIN I     Status: Normal   Collection Time   08/13/11  8:12 PM      Component Value Range Comment   Troponin i, poc 0.00  0.00 - 0.08 (ng/mL)    Comment 3              Radiological Exams on Admission: *RADIOLOGY REPORT*  Clinical Data: Chest pain, shortness of breath.  CHEST - 2 VIEW  Comparison: 03/11/2011  Findings: Heart is borderline in size. Lungs are clear. No  effusions or edema. No acute bony abnormality.  IMPRESSION:  No active cardiopulmonary disease.  Original Report Authenticated By: Raelyn Number, M.D.  *RADIOLOGY REPORT*  Clinical Data: Abdominal pain, nausea, diarrhea.  CT ABDOMEN AND PELVIS WITHOUT CONTRAST  Technique: Multidetector CT imaging of the abdomen and pelvis was performed following the standard protocol without intravenous contrast.  Comparison: Plain film 05/26/2011. Multiple CTs, the most recent 04/03/2009. MRI 07/03/2006.  Findings: Lung bases are clear. No effusions. Heart is normal size.  Prior cholecystectomy. Spleen, pancreas, adrenals and kidneys are unremarkable.  While difficult to see on the study without IV contrast, there is a low density  lesion within the right hepatic lobe measuring 3.6 x 1.9 cm. When comparing to multiple old studies, including abdominal MRI dating back to 200 no acute bony abnormality. 8, this area was shown by prior MRI to most compatible with hemangioma. This has enlarged since prior study.  Normal retrocecal appendix. Large and small bowel grossly unremarkable. No free fluid or free air. Small retroperitoneal lymph nodes are stable since prior study. Aorta is normal caliber.  IMPRESSION: Enlarging hypodensity in the right hepatic lobe. This was shown on prior MRI to be the most compatible with hemangioma.  No acute findings in the abdomen or pelvis.  Original Report Authenticated By: Raelyn Number, M.D.   Assessment/Plan Principal Problem:  *Acute gastroenteritis: Patient appears to have an acute gastroenteritis, probably of viral etiology. However, as wcc is also elevated, we shall manage with a combination of Ciprofloxacin and Flagyl, while stool studies, including C.Difficle PCR, are in progress. Otherwise, we shall manage with supportive treatment, ie, bowel rest and iv fluids,as well as analgesics. Active Problems:  1. Gastroparesis: Patient's known gastroparesis, may have been exacerbated by above. We shall manage with antiemetics, but avoid Reglan for now, as this may worsen intestinal hurry.  2. Chest pain: This is atypical, and is reproducible on palpation of sternum. CXR is unremarkable, EKG shows no acute ischemic changes, and cardiac enzymes are negative so far. We shall monitor telemetrically, check D-Dimer, treat with H2RA, and address oral thrush.  3. DM (diabetes mellitus)this appears controlled, based on random blood glucose. We shall manage with SSI, and appropriate diet, when oral intake is more reliable. Meanwhile, will check HBA1C for completeness. 4. Dyslipidemia: Check Lipid profile and TSH. 5. HTN: This appears controlled at the present time. 6. Oral thrush: Treat with  Diflucan for 7 days.  Further management will depend on clinical course.  Time Spent on Admission: 45 mins.  Marnita Poirier,CHRISTOPHER 08/13/2011, 11:20 PM

## 2011-08-13 NOTE — ED Provider Notes (Addendum)
History     CSN: [id number]  Arrival date & time 08/13/11  1647   First MD Initiated Contact with Patient 08/13/11 1909      Chief Complaint  Patient presents with  . Chest Pain  . Diarrhea  . Nausea    (Consider location/radiation/quality/duration/timing/severity/associated sxs/prior treatment) HPI Comments: A week ago she started having some sinus congestion.  A few days ago she started with chest pain, abdominal pain and diarrhea.  She has pain in her back when she takes a deep breath.  She has been vomiting today as well.  No fevers.  No trouble urinating.  The symptoms get worse with eating or drinking.  The pain in her abdomen increases when she has the diarrhea.  The pain is cramping and severe.  Pt does have history of gastroparesis which feels different than today.  Patient is a 51 y.o. female presenting with diarrhea. The history is provided by the patient.  Diarrhea The primary symptoms include diarrhea.  Pt without history of heart disease.  She is blind and is unsure what the diarrhea has looked like.  Past Medical History  Diagnosis Date  . Asthma   . Diabetes mellitus   . Hypertension   . Blind   . Hypothyroidism   . Hyperlipidemia   . Diabetic neuropathy     Past Surgical History  Procedure Date  . Cesarean section     x 2  . Abdominal hysterectomy   . Cholecystectomy     History reviewed. No pertinent family history.  History  Substance Use Topics  . Smoking status: Former Smoker    Quit date: 05/22/1992  . Smokeless tobacco: Never Used  . Alcohol Use: No    OB History    Grav Para Term Preterm Abortions TAB SAB Ect Mult Living                  Review of Systems  Gastrointestinal: Positive for diarrhea.  All other systems reviewed and are negative.    Allergies  Codeine; Iohexol; and Penicillins  Home Medications   Current Outpatient Rx  Name Route Sig Dispense Refill  . ALBUTEROL SULFATE HFA 108 (90 BASE) MCG/ACT IN AERS  Inhalation Inhale 2 puffs into the lungs every 4 (four) hours as needed. For shortness of breath    . ASPIRIN 325 MG PO TABS Oral Take 325 mg by mouth daily.      . INSULIN ASPART 100 UNIT/ML [place] SOLN Subcutaneous Inject 20 Units into the skin 3 (three) times daily before meals.      . INSULIN GLARGINE 100 UNIT/ML [place] SOLN Subcutaneous Inject 30 Units into the skin at bedtime.      Marland Kitchen LEVOTHYROXINE SODIUM 200 MCG PO TABS Oral Take 200 mcg by mouth daily.      Marland Kitchen MONTELUKAST SODIUM 10 MG PO TABS Oral Take 10 mg by mouth at bedtime.      Marland Kitchen NAPROXEN SODIUM 220 MG PO CAPS Oral Take 2 capsules by mouth.      Marland Kitchen PANTOPRAZOLE SODIUM 40 MG PO TBEC Oral Take 40 mg by mouth daily.      Marland Kitchen POTASSIUM CHLORIDE CRYS ER 20 MEQ PO TBCR Oral Take 20 mEq by mouth daily.     Marland Kitchen PREGABALIN 50 MG PO CAPS Oral Take 50 mg by mouth 2 (two) times daily.      Marland Kitchen RAMIPRIL 5 MG PO CAPS Oral Take 5 mg by mouth daily.      Marland Kitchen SAXAGLIPTIN-METFORMIN  ER 5-500 MG PO TB24 Oral Take 1 tablet by mouth daily.      Marland Kitchen SIMVASTATIN 20 MG PO TABS Oral Take 20 mg by mouth every evening.      Marland Kitchen VITAMIN B-12 1000 MCG PO TABS Oral Take 1,000 mcg by mouth daily.      Marland Kitchen DEXTROMETHORPHAN-GUAIFENESIN 10-100 MG/5ML PO LIQD Oral Take 5 mLs by mouth every 4 (four) hours as needed. For cold/flu symptom relief      BP 95/65  Pulse 90  Temp 98.7 F (37.1 C)  Resp 16  SpO2 99%  Physical Exam  Nursing note and vitals reviewed. Constitutional: She appears well-developed and well-nourished. No distress.  HENT:  Head: Normocephalic and atraumatic.  Right Ear: External ear normal.  Left Ear: External ear normal.  Eyes: Conjunctivae are normal. Right eye exhibits no discharge. Left eye exhibits no discharge. No scleral icterus.  Neck: Neck supple. No tracheal deviation present.  Cardiovascular: Normal rate, regular rhythm and intact distal pulses.   Pulmonary/Chest: Effort normal and breath sounds normal. No stridor. No respiratory distress. She has no  wheezes. She has no rales.  Abdominal: Soft. Bowel sounds are normal. She exhibits no distension. There is tenderness in the right lower quadrant, suprapubic area and left lower quadrant. There is no rebound and no guarding.  Musculoskeletal: She exhibits no edema and no tenderness.  Neurological: She is alert. She has normal strength. No sensory deficit. Cranial nerve deficit:  no gross defecits noted. She exhibits normal muscle tone. She displays no seizure activity. Coordination normal.  Skin: Skin is warm and dry. No rash noted.  Psychiatric: She has a normal mood and affect.    ED Course  Procedures (including critical care time)  Labs Reviewed  CBC - Abnormal; Notable for the following:    WBC 15.9 (*)    RBC 5.39 (*)    MCV 76.8 (*)    MCH 25.6 (*)    All other components within normal limits  BASIC METABOLIC PANEL - Abnormal; Notable for the following:    Glucose, Bld 133 (*)    GFR calc non Af Amer 82 (*)    All other components within normal limits  PRO B NATRIURETIC PEPTIDE - Abnormal; Notable for the following:    Pro B Natriuretic peptide (BNP) 158.3 (*)    All other components within normal limits  GLUCOSE, CAPILLARY - Abnormal; Notable for the following:    Glucose-Capillary 107 (*)    All other components within normal limits  TROPONIN I  LIPASE, BLOOD  POCT I-STAT TROPONIN I  URINALYSIS, ROUTINE W REFLEX MICROSCOPIC   Ct Abdomen Pelvis Wo Contrast  08/13/2011  *RADIOLOGY REPORT*  Clinical Data: Abdominal pain, nausea, diarrhea.  CT ABDOMEN AND PELVIS WITHOUT CONTRAST  Technique:  Multidetector CT imaging of the abdomen and pelvis was performed following the standard protocol without intravenous contrast.  Comparison: Plain film 05/26/2011.  Multiple CTs, the most recent 04/03/2009.  MRI 07/03/2006.  Findings: Lung bases are clear.  No effusions.  Heart is normal size.  Prior cholecystectomy. Spleen, pancreas, adrenals and kidneys are unremarkable.  While difficult  to see on the study without IV contrast, there is a low density lesion within the right hepatic lobe measuring 3.6 x 1.9 cm. When comparing to multiple old studies, including abdominal MRI dating back to 200 no acute bony abnormality.  8, this area was shown by prior MRI to most compatible with hemangioma.  This has enlarged since prior study.  Normal retrocecal appendix.  Large and small bowel grossly unremarkable.  No free fluid or free air.  Small retroperitoneal lymph nodes are stable since prior study.  Aorta is normal caliber.  IMPRESSION: Enlarging hypodensity in the right hepatic lobe.  This was shown on prior MRI to be the most compatible with hemangioma.  No acute findings in the abdomen or pelvis.  Original Report Authenticated By: Raelyn Number, M.D.   Dg Chest 2 View  08/13/2011  *RADIOLOGY REPORT*  Clinical Data: Chest pain, shortness of breath.  CHEST - 2 VIEW  Comparison: 03/11/2011  Findings: Heart is borderline in size.  Lungs are clear.  No effusions or edema.  No acute bony abnormality.  IMPRESSION: No active cardiopulmonary disease.  Original Report Authenticated By: Raelyn Number, M.D.     MDM  The patient's CT scan does not show any evidence of acute intra-abdominal abnormality. She however, continues to have diarrhea and nausea with vomiting. Patient states despite anti-emetics here in the emergency department she has continued to vomit. Patient does not feel well enough to go home. It is possible this is related to nor virus. Because of her inability to keep down oral fluids, I will consult the hospitalist for admission for observation and IV hydration.   Pt's chest pain is atypical for cardiac.  Will plan on checking serial cardiac enzymes.  D/w Dr Blenda Nicely.  Will add on d dimer.     Kathalene Frames, MD 08/13/11 2226

## 2011-08-13 NOTE — ED Notes (Signed)
Pt resting on stretcher with eyes closed, RR even and unlabored. No distress noted at this time. VSS

## 2011-08-13 NOTE — ED Notes (Addendum)
Called out with c/o CP and SOB for 3 days. Pain is tender to palpation,inspiration and movement. Pt also reprts diarrhea for one day. Pt is blind and unaware of color and consistency of stool

## 2011-08-13 NOTE — ED Notes (Signed)
Pt attempted to provide urine specimen but was unable to due to having bowel movement at same time.

## 2011-08-13 NOTE — ED Provider Notes (Signed)
51 year old female currently in triage while awaiting for provider.   She proceeds to have multiple bouts of vomiting and complaining of nausea. Will prescribe Zofran for symptomatic treatment  Domenic Moras, PA-C 08/13/11 1832

## 2011-08-14 ENCOUNTER — Encounter (HOSPITAL_COMMUNITY): Payer: Self-pay | Admitting: *Deleted

## 2011-08-14 LAB — CARDIAC PANEL(CRET KIN+CKTOT+MB+TROPI)
CK, MB: 1.3 ng/mL (ref 0.3–4.0)
CK, MB: 1.4 ng/mL (ref 0.3–4.0)
CK, MB: 1.8 ng/mL (ref 0.3–4.0)
Relative Index: INVALID (ref 0.0–2.5)
Total CK: 46 U/L (ref 7–177)
Total CK: 52 U/L (ref 7–177)
Total CK: 54 U/L (ref 7–177)
Troponin I: <0.3 ng/mL (ref <0.30)
Troponin I: <0.3 ng/mL (ref <0.30)

## 2011-08-14 LAB — COMPREHENSIVE METABOLIC PANEL
ALT: 18 U/L (ref 0–35)
BUN: 19 mg/dL (ref 6–23)
CO2: 24 mEq/L (ref 19–32)
Calcium: 8.1 mg/dL — ABNORMAL LOW (ref 8.4–10.5)
Creatinine, Ser: 0.88 mg/dL (ref 0.50–1.10)
GFR calc Af Amer: 86 mL/min — ABNORMAL LOW (ref >90)
GFR calc non Af Amer: 74 mL/min — ABNORMAL LOW (ref >90)
Glucose, Bld: 116 mg/dL — ABNORMAL HIGH (ref 70–99)
Sodium: 143 mEq/L (ref 135–145)
Total Protein: 5.9 g/dL — ABNORMAL LOW (ref 6.0–8.3)

## 2011-08-14 LAB — LIPID PANEL
Cholesterol: 108 mg/dL (ref 0–200)
Triglycerides: 42 mg/dL (ref <150)
VLDL: 8 mg/dL (ref 0–40)

## 2011-08-14 LAB — MRSA PCR SCREENING: MRSA by PCR: NEGATIVE

## 2011-08-14 LAB — URINALYSIS, ROUTINE W REFLEX MICROSCOPIC
Bilirubin Urine: NEGATIVE
Hgb urine dipstick: NEGATIVE
Ketones, ur: NEGATIVE mg/dL
Protein, ur: NEGATIVE mg/dL
Urobilinogen, UA: 0.2 mg/dL (ref 0.0–1.0)

## 2011-08-14 LAB — TSH: TSH: 0.01 u[IU]/mL — ABNORMAL LOW (ref 0.350–4.500)

## 2011-08-14 LAB — GLUCOSE, CAPILLARY
Glucose-Capillary: 111 mg/dL — ABNORMAL HIGH (ref 70–99)
Glucose-Capillary: 182 mg/dL — ABNORMAL HIGH (ref 70–99)

## 2011-08-14 LAB — CBC
HCT: 33.4 % — ABNORMAL LOW (ref 36.0–46.0)
Hemoglobin: 11.1 g/dL — ABNORMAL LOW (ref 12.0–15.0)
MCHC: 33.2 g/dL (ref 30.0–36.0)
WBC: 9.1 10*3/uL (ref 4.0–10.5)

## 2011-08-14 LAB — HEMOGLOBIN A1C: Hgb A1c MFr Bld: 10.4 % — ABNORMAL HIGH (ref <5.7)

## 2011-08-14 NOTE — Progress Notes (Signed)
Subjective: Still feels nauseous today.  Objective: Vital signs in last 24 hours: Temp:  [98.7 F (37.1 C)-99.3 F (37.4 C)] 98.7 F (37.1 C) (03/31 0500) Pulse Rate:  [86-94] 86  (03/31 0500) Resp:  [16-18] 18  (03/31 0500) BP: (95-121)/(65-72) 102/66 mmHg (03/31 0500) SpO2:  [93 %-99 %] 93 % (03/31 0500) Weight:  [81.874 kg (180 lb 8 oz)] 81.874 kg (180 lb 8 oz) (03/31 0006) Weight change:  Last BM Date: 08/13/11  Intake/Output from previous day: 03/30 0701 - 03/31 0700 In: 400 [IV Piggyback:400] Out: -      Physical Exam: General: Alert, awake, oriented x3. HEENT: No bruits, no goiter, blind Heart: Regular rate and rhythm, without murmurs, rubs, gallops. Lungs: Clear to auscultation bilaterally. Abdomen: Soft, nontender, nondistended, positive bowel sounds. Extremities: No clubbing cyanosis or edema with positive pedal pulses. Neuro: Grossly intact, nonfocal.    Lab Results: Basic Metabolic Panel:  Basename 08/14/11 0740 08/14/11 0012 08/13/11 1730  NA 143 -- 144  K 3.8 -- 4.0  CL 112 -- 108  CO2 24 -- 27  GLUCOSE 116* -- 133*  BUN 19 -- 20  CREATININE 0.88 -- 0.81  CALCIUM 8.1* -- 9.3  MG -- 1.4* --  PHOS -- -- --   Liver Function Tests:  [facility] 08/14/11 0740  AST 18  ALT 18  ALKPHOS 94  BILITOT 0.2*  PROT 5.9*  ALBUMIN 2.6*    Basename 08/13/11 1956  LIPASE 18  AMYLASE --   CBC:  Basename 08/14/11 0740 08/13/11 1730  WBC 9.1 15.9*  NEUTROABS -- --  HGB 11.1* 13.8  HCT 33.4* 41.4  MCV 76.6* 76.8*  PLT 165 213   Cardiac Enzymes:  Basename 08/14/11 0740 08/14/11 0012 08/13/11 1747  CKTOTAL 46 54 --  CKMB 1.3 1.8 --  CKMBINDEX -- -- --  TROPONINI <0.30 <0.30 <0.30   BNP:  Basename 08/13/11 1730  PROBNP 158.3*   D-Dimer:  Basename 08/13/11 2325  DDIMER 0.91*   CBG:  Basename 08/14/11 0736 08/14/11 0426 08/14/11 0003 08/13/11 1846  GLUCAP 110* 146* 111* 107*    Studies/Results: Ct Abdomen Pelvis Wo  Contrast  08/13/2011  *RADIOLOGY REPORT*  Clinical Data: Abdominal pain, nausea, diarrhea.  CT ABDOMEN AND PELVIS WITHOUT CONTRAST  Technique:  Multidetector CT imaging of the abdomen and pelvis was performed following the standard protocol without intravenous contrast.  Comparison: Plain film 05/26/2011.  Multiple CTs, the most recent 04/03/2009.  MRI 07/03/2006.  Findings: Lung bases are clear.  No effusions.  Heart is normal size.  Prior cholecystectomy. Spleen, pancreas, adrenals and kidneys are unremarkable.  While difficult to see on the study without IV contrast, there is a low density lesion within the right hepatic lobe measuring 3.6 x 1.9 cm. When comparing to multiple old studies, including abdominal MRI dating back to 200 no acute bony abnormality.  8, this area was shown by prior MRI to most compatible with hemangioma.  This has enlarged since prior study.  Normal retrocecal appendix.  Large and small bowel grossly unremarkable.  No free fluid or free air.  Small retroperitoneal lymph nodes are stable since prior study.  Aorta is normal caliber.  IMPRESSION: Enlarging hypodensity in the right hepatic lobe.  This was shown on prior MRI to be the most compatible with hemangioma.  No acute findings in the abdomen or pelvis.  Original Report Authenticated By: Raelyn Number, M.D.   Dg Chest 2 View  08/13/2011  *RADIOLOGY REPORT*  Clinical Data:  Chest pain, shortness of breath.  CHEST - 2 VIEW  Comparison: 03/11/2011  Findings: Heart is borderline in size.  Lungs are clear.  No effusions or edema.  No acute bony abnormality.  IMPRESSION: No active cardiopulmonary disease.  Original Report Authenticated By: Raelyn Number, M.D.    Medications: Scheduled Meds:   . sodium chloride  1,000 mL Intravenous Once  . diphenhydrAMINE  12.5 mg Intravenous Once  . enoxaparin  40 mg Subcutaneous Q24H  . famotidine (PEPCID) IV  20 mg Intravenous Q12H  . fluconazole (DIFLUCAN) IV  100 mg Intravenous Q24H  .  HYDROmorphone  1 mg Intravenous Once  . insulin aspart  0-9 Units Subcutaneous Q4H  . insulin glargine  15 Units Subcutaneous Daily  . levothyroxine  100 mcg Intravenous QAC breakfast  . ondansetron  4 mg Intravenous Once  . ondansetron  4 mg Intravenous Once  . pregabalin  50 mg Oral BID  . sodium chloride  3 mL Intravenous Q12H  . vitamin B-12  1,000 mcg Oral Daily  . DISCONTD: ciprofloxacin  400 mg Intravenous Q12H  . DISCONTD: metoCLOPramide (REGLAN) injection  10 mg Intravenous Once  . DISCONTD: metronidazole  500 mg Intravenous Q8H   Continuous Infusions:   . sodium chloride 1,000 mL (08/14/11 0435)   PRN Meds:.acetaminophen, acetaminophen, albuterol, HYDROmorphone, ipratropium, ondansetron (ZOFRAN) IV, ondansetron  Assessment/Plan:  Principal Problem:  *Acute gastroenteritis Active Problems:  Gastroparesis  Chest pain  DM (diabetes mellitus)  Oral thrush   #1 N/V/Diarrhea: Still feels nauseous today. No further vomiting or diarrhea since last night. I suspect she has viral gastroenteritis ?norovirus. See no need for antibiotics, so will discontinue cipro/flagyl at this time. Continue zofran as needed.  #2 Thrush: continue diflucan.  #3 CP: resolved. Doubt cardiac related. EKG/cardiac enzymes negative.  #4 DM: well controlled.  #5 Dispo: Hopeful for DC in am if symptoms resolved.   LOS: 1 day   [place] [facility] Pager: [phone] 08/14/2011, 9:59 AM

## 2011-08-15 LAB — BASIC METABOLIC PANEL
BUN: 7 mg/dL (ref 6–23)
CO2: 21 mEq/L (ref 19–32)
Chloride: 113 mEq/L — ABNORMAL HIGH (ref 96–112)
GFR calc Af Amer: >90 mL/min (ref >90)
Potassium: 3.4 mEq/L — ABNORMAL LOW (ref 3.5–5.1)

## 2011-08-15 LAB — CBC
HCT: 33.6 % — ABNORMAL LOW (ref 36.0–46.0)
Hemoglobin: 11 g/dL — ABNORMAL LOW (ref 12.0–15.0)
MCV: 77.2 fL — ABNORMAL LOW (ref 78.0–100.0)
WBC: 3.7 10*3/uL — ABNORMAL LOW (ref 4.0–10.5)

## 2011-08-15 LAB — GLUCOSE, CAPILLARY: Glucose-Capillary: 143 mg/dL — ABNORMAL HIGH (ref 70–99)

## 2011-08-15 MED ORDER — METOCLOPRAMIDE HCL 5 MG/ML IJ SOLN
10.0000 mg | Freq: Four times a day (QID) | INTRAMUSCULAR | Status: DC
  Administered 2011-08-15 – 2011-08-16 (×5): 10 mg via INTRAVENOUS
  Filled 2011-08-15 (×8): qty 2

## 2011-08-15 MED ORDER — INSULIN GLARGINE 100 UNIT/ML ~~LOC~~ SOLN
17.0000 [IU] | Freq: Every day | SUBCUTANEOUS | Status: DC
  Administered 2011-08-16: 17 [IU] via SUBCUTANEOUS

## 2011-08-15 MED ORDER — POTASSIUM CHLORIDE 10 MEQ/100ML IV SOLN
10.0000 meq | INTRAVENOUS | Status: AC
  Administered 2011-08-15 (×2): 10 meq via INTRAVENOUS
  Filled 2011-08-15 (×2): qty 100

## 2011-08-15 MED ORDER — MAGNESIUM SULFATE IN D5W 10-5 MG/ML-% IV SOLN
1.0000 g | Freq: Once | INTRAVENOUS | Status: AC
  Administered 2011-08-15: 1 g via INTRAVENOUS
  Filled 2011-08-15: qty 100

## 2011-08-15 MED ORDER — LEVOTHYROXINE SODIUM 100 MCG IV SOLR
87.5000 ug | Freq: Every day | INTRAVENOUS | Status: DC
  Administered 2011-08-16: 87.5 ug via INTRAVENOUS
  Filled 2011-08-15 (×2): qty 4.4

## 2011-08-15 MED ORDER — PROMETHAZINE HCL 25 MG/ML IJ SOLN: 12.5000 mg | Freq: Four times a day (QID) | INTRAMUSCULAR | Status: DC | PRN

## 2011-08-15 MED ORDER — POTASSIUM CHLORIDE IN NACL 20-0.9 MEQ/L-% IV SOLN
INTRAVENOUS | Status: DC
  Administered 2011-08-15 – 2011-08-16 (×3): via INTRAVENOUS
  Filled 2011-08-15 (×5): qty 1000

## 2011-08-15 NOTE — Progress Notes (Signed)
   CARE MANAGEMENT NOTE 08/15/2011  Patient:  Amy [name], Amy [name]   Account Number:  [id number]  Date Initiated:  08/15/2011  Documentation initiated by:  Dessa Phi  Subjective/Objective Assessment:   ADMITTED W/N/V/D.HX: BLIND.     Action/Plan:   FROM HOME   Anticipated DC Date:  08/17/2011   Anticipated DC Plan:  [place]         Choice offered to / List presented to:             Status of service:  In process, will continue to follow Medicare Important Message given?   (If response is "NO", the following Medicare IM given date fields will be blank) Date Medicare IM given:   Date Additional Medicare IM given:    Discharge Disposition:    Per UR Regulation:  Reviewed for med. necessity/level of care/duration of stay  If discussed at [place] of Stay Meetings, dates discussed:    Comments:  08/15/11 [facility] RN,BSN NCM [id number]

## 2011-08-15 NOTE — Progress Notes (Signed)
Patient seen and examined. Agree with note by Dyanne Carrel, NP. Vomiting and diarrhea have resolved. I think she probably did have self-limiting viral gastroenteritis. She still has dry heaves and I suspect this is mainly related to her diabetic gastroparesis. Will start her on reglan. Has had difficulty tolerating a clear diet. Will continue to follow.  Domingo Mend, MD [facility] Pager: [phone]

## 2011-08-15 NOTE — Evaluation (Signed)
Physical Therapy Evaluation Patient Details Name: Amy [name] MRN: [id number] DOB: 1960/09/26 Today's Date: 08/15/2011  Problem List:  Patient Active Problem List  Diagnoses  . Gastroenteritis  . Hypotension  . Dehydration  . Blindness  . DM type 1 causing eye disease, not at goal  . Asthma  . Diarrhea  . Vomiting  . Hypothyroidism  . Diabetic neuropathy  . UTI (lower urinary tract infection)  . Diabetic nephropathy  . Acute gastroenteritis  . Gastroparesis  . Chest pain  . DM (diabetes mellitus)  . Oral thrush    Past Medical History:  Past Medical History  Diagnosis Date  . Asthma   . Diabetes mellitus   . Hypertension   . Blind   . Hypothyroidism   . Hyperlipidemia   . Diabetic neuropathy    Past Surgical History:  Past Surgical History  Procedure Date  . Cesarean section     x 2  . Abdominal hysterectomy   . Cholecystectomy     PT Assessment/Plan/Recommendation PT Assessment Clinical Impression Statement: Patient admitted with N/V/CP and presents with decreased activity tolerance and will benefit from skilled PT to maximize independence and safety for d/c home with aide assist PT Recommendation/Assessment: Patient will need skilled PT in the acute care venue PT Problem List: Decreased activity tolerance;Decreased mobility PT Therapy Diagnosis : Difficulty walking;Acute pain PT Plan PT Frequency: Min 3X/week PT Treatment/Interventions: Gait training;DME instruction;Stair training;Functional mobility training;Therapeutic activities;Patient/family education PT Recommendation Follow Up Recommendations: No PT follow up Equipment Recommended: None recommended by PT PT Goals  Acute Rehab PT Goals PT Goal Formulation: With patient Time For Goal Achievement: 7 days Pt will go Supine/Side to Sit: with modified independence PT Goal: Supine/Side to Sit - Progress: Goal set today Pt will go Sit to Stand: with modified independence PT Goal: Sit to Stand -  Progress: Goal set today Pt will Stand: with modified independence;3 - 5 min;with unilateral upper extremity support;with no upper extremity support (during functional activity) PT Goal: Stand - Progress: Goal set today Pt will Ambulate: 51 - 150 feet;with cane;with modified independence PT Goal: Ambulate - Progress: Goal set today Pt will Go Up / Down Stairs: 1-2 stairs;with supervision PT Goal: Up/Down Stairs - Progress: Goal set today  PT Evaluation Precautions/Restrictions  Precautions Precautions: Other (comment) Precaution Comments: brown contact precautions; patient legally blind Prior Functioning  Home Living Lives With: Alone Receives Help From: Personal care attendant (2 hours, 7 days a week) Type of Home: House Home Layout: One level Home Access: Stairs to enter Entrance Stairs-Rails: None Entrance Stairs-Number of Steps: 2 Bathroom Shower/Tub: Chiropodist: Standard Home Adaptive Equipment: Gilford Rile - four wheeled;Straight cane;Bedside commode/3-in-1;Shower chair with back;Tub transfer bench Additional Comments: straight cane for blindness, not mobility Prior Function Level of Independence: Requires assistive device for independence;Needs assistance with ADLs Comments: pt has an aide for 2 hours every day that assists with meals/household tasks and also with self care PRN. She also has hired assist for her errands.  Cognition Cognition Arousal/Alertness: Awake/alert Overall Cognitive Status: Appears within functional limits for tasks assessed Orientation Level: Oriented X4 Sensation/Coordination Sensation Light Touch: Appears Intact Extremity Assessment RLE Assessment RLE Assessment: Within Functional Limits LLE Assessment LLE Assessment: Within Functional Limits Mobility (including Balance) Transfers Transfers: Yes Sit to Stand: 5: Supervision;4: Min assist;With upper extremity assist;From chair/3-in-1 Sit to Stand Details (indicate cue type  and reason): minguard assist from 3:1, cues for armrests Stand to Sit: 5: Supervision;With upper extremity assist;To chair/3-in-1 Stand  to Sit Details: cues for position of chair due to blindness Ambulation/Gait Ambulation/Gait: Yes Ambulation/Gait Assistance: 4: Min assist Ambulation/Gait Assistance Details (indicate cue type and reason): for guidance due to blindess Ambulation Distance (Feet): 150 Feet (plus another 93' with RW and assist/cues for direction) Assistive device: Rolling walker;None Gait Pattern: Within Functional Limits  Balance Balance Assessed: Yes Static Standing Balance Static Standing - Balance Support: During functional activity;No upper extremity supported;Left upper extremity supported Static Standing - Level of Assistance: 5: Stand by assistance Static Standing - Comment/# of Minutes: during bathing Exercise    End of Session PT - End of Session Equipment Utilized During Treatment: Gait belt Activity Tolerance: Patient tolerated treatment well Patient left: in chair;with call bell in reach General Behavior During Session: [facility] for tasks performed Cognition: [facility] for tasks performed  [facility] 08/15/2011, 1:35 PM

## 2011-08-15 NOTE — Progress Notes (Signed)
Subjective: Up in chair. Continues to feel nauseated. Several episodes dry heaves. Reports abdominal pain lower quadrants.   Objective: Vital signs Filed Vitals:   08/14/11 0500 08/14/11 1400 08/14/11 2247 08/15/11 0509  BP: 102/66 119/75 156/97 145/82  Pulse: 86 93 80 91  Temp: 98.7 F (37.1 C) 99.1 F (37.3 C) 98.7 F (37.1 C) 98.4 F (36.9 C)  TempSrc: Oral Oral Oral Oral  Resp: 18 18 17 18   Height:      Weight:    82.6 kg (182 lb 1.6 oz)  SpO2: 93% 93% 90% 91%   Weight change: 0.726 kg (1 lb 9.6 oz) Last BM Date: 08/13/11  Intake/Output from previous day: 03/31 0701 - 04/01 0700 In: 2830 [P.O.:480; I.V.:2300; IV Piggyback:50] Out: 550 [Urine:550] Total I/O In: 120 [P.O.:120] Out: 650 [Urine:600; Emesis/NG output:50]   Physical Exam: General: Alert, awake, oriented x3, in no acute distress. Up in chair obviously not feeling well.  HEENT: No bruits, no goiter. Blind Heart: Regular rate and rhythm, without murmurs, rubs, gallops. Lungs: Normal effort. Breath sounds clear to auscultation bilaterally. No wheeze Abdomen: Soft, nontender, nondistended, very sluggish BS Extremities: No clubbing cyanosis or edema with positive pedal pulses. Neuro: Grossly intact, nonfocal.    Lab Results: Basic Metabolic Panel:  Basename 08/15/11 0440 08/14/11 0740 08/14/11 0012  NA 143 143 --  K 3.4* 3.8 --  CL 113* 112 --  CO2 21 24 --  GLUCOSE 165* 116* --  BUN 7 19 --  CREATININE 0.67 0.88 --  CALCIUM 7.9* 8.1* --  MG -- -- 1.4*  PHOS -- -- --   Liver Function Tests:  [facility] 08/14/11 0740  AST 18  ALT 18  ALKPHOS 94  BILITOT 0.2*  PROT 5.9*  ALBUMIN 2.6*    Basename 08/13/11 1956  LIPASE 18  AMYLASE --   No results found for this basename: AMMONIA:2 in the last 72 hours CBC:  Basename 08/15/11 0440 08/14/11 0740  WBC 3.7* 9.1  NEUTROABS -- --  HGB 11.0* 11.1*  HCT 33.6* 33.4*  MCV 77.2* 76.6*  PLT 150 165   Cardiac Enzymes:  Basename 08/14/11 1530  08/14/11 0740 08/14/11 0012  CKTOTAL 52 46 54  CKMB 1.4 1.3 1.8  CKMBINDEX -- -- --  TROPONINI <0.30 <0.30 <0.30   BNP:  Basename 08/13/11 1730  PROBNP 158.3*   D-Dimer:  Basename 08/13/11 2325  DDIMER 0.91*   CBG:  Basename 08/15/11 0743 08/15/11 0405 08/15/11 0011 08/14/11 2018 08/14/11 1628 08/14/11 1143  GLUCAP 149* 169* 143* 261* 182* 195*   Hemoglobin A1C:  Basename 08/14/11 0012  HGBA1C 10.4*   Fasting Lipid Panel:  Basename 08/14/11 0012  CHOL 108  HDL 43  LDLCALC 57  TRIG 42  CHOLHDL 2.5  LDLDIRECT --   Thyroid Function Tests:  Basename 08/14/11 0740  TSH 0.010*  T4TOTAL --  FREET4 --  T3FREE --  THYROIDAB --   Anemia Panel: No results found for this basename: VITAMINB12,FOLATE,FERRITIN,TIBC,IRON,RETICCTPCT in the last 72 hours Coagulation: No results found for this basename: LABPROT:2,INR:2 in the last 72 hours Urine Drug Screen: Drugs of Abuse  No results found for this basename: labopia, cocainscrnur, labbenz, amphetmu, thcu, labbarb    Alcohol Level: No results found for this basename: ETH:2 in the last 72 hours Urinalysis:  Basename 08/14/11 1351  COLORURINE YELLOW  LABSPEC 1.021  PHURINE 5.0  GLUCOSEU NEGATIVE  HGBUR NEGATIVE  BILIRUBINUR NEGATIVE  KETONESUR NEGATIVE  PROTEINUR NEGATIVE  UROBILINOGEN 0.2  NITRITE NEGATIVE  LEUKOCYTESUR NEGATIVE   Misc. Labs:  Recent Results (from the past 240 hour(s))  MRSA PCR SCREENING     Status: Normal   Collection Time   08/14/11  2:23 PM      Component Value Range Status Comment   MRSA by PCR NEGATIVE  NEGATIVE  Final     Studies/Results: Ct Abdomen Pelvis Wo Contrast  08/13/2011  *RADIOLOGY REPORT*  Clinical Data: Abdominal pain, nausea, diarrhea.  CT ABDOMEN AND PELVIS WITHOUT CONTRAST  Technique:  Multidetector CT imaging of the abdomen and pelvis was performed following the standard protocol without intravenous contrast.  Comparison: Plain film 05/26/2011.  Multiple CTs, the  most recent 04/03/2009.  MRI 07/03/2006.  Findings: Lung bases are clear.  No effusions.  Heart is normal size.  Prior cholecystectomy. Spleen, pancreas, adrenals and kidneys are unremarkable.  While difficult to see on the study without IV contrast, there is a low density lesion within the right hepatic lobe measuring 3.6 x 1.9 cm. When comparing to multiple old studies, including abdominal MRI dating back to 200 no acute bony abnormality.  8, this area was shown by prior MRI to most compatible with hemangioma.  This has enlarged since prior study.  Normal retrocecal appendix.  Large and small bowel grossly unremarkable.  No free fluid or free air.  Small retroperitoneal lymph nodes are stable since prior study.  Aorta is normal caliber.  IMPRESSION: Enlarging hypodensity in the right hepatic lobe.  This was shown on prior MRI to be the most compatible with hemangioma.  No acute findings in the abdomen or pelvis.  Original Report Authenticated By: Raelyn Number, M.D.   Dg Chest 2 View  08/13/2011  *RADIOLOGY REPORT*  Clinical Data: Chest pain, shortness of breath.  CHEST - 2 VIEW  Comparison: 03/11/2011  Findings: Heart is borderline in size.  Lungs are clear.  No effusions or edema.  No acute bony abnormality.  IMPRESSION: No active cardiopulmonary disease.  Original Report Authenticated By: Raelyn Number, M.D.    Medications: Scheduled Meds:   . enoxaparin  40 mg Subcutaneous Q24H  . famotidine (PEPCID) IV  20 mg Intravenous Q12H  . fluconazole (DIFLUCAN) IV  100 mg Intravenous Q24H  . insulin aspart  0-9 Units Subcutaneous Q4H  . insulin glargine  15 Units Subcutaneous Daily  . levothyroxine  100 mcg Intravenous QAC breakfast  . magnesium sulfate 1 - 4 g bolus IVPB  1 g Intravenous Once  . potassium chloride  10 mEq Intravenous Q1 Hr x 2  . pregabalin  50 mg Oral BID  . sodium chloride  3 mL Intravenous Q12H  . vitamin B-12  1,000 mcg Oral Daily   Continuous Infusions:   . 0.9 % NaCl  with KCl 20 mEq / L    . DISCONTD: sodium chloride 1,000 mL (08/14/11 0435)   PRN Meds:.acetaminophen, acetaminophen, albuterol, HYDROmorphone, ipratropium, ondansetron (ZOFRAN) IV, ondansetron, promethazine  Assessment/Plan:  Principal Problem:  *Acute gastroenteritis Active Problems:  Gastroparesis  Chest pain  DM (diabetes mellitus)  Oral thrush  #1 N/V/Diarrhea: Still feels nauseous with several episodes of dry heaves. Unable to tolerate clear liquids. No further  Diarrhea.Suspect she has viral gastroenteritis  See no need for antibiotics. Continue zofran as primary antiemetic but will provide prn phenergan for refractory vomiting. If no improvement in am will consider scheduled zofran. Continue IV fluids at slightly decreased rate. Continue clear liquids.  #2. Mild hypokalemia: likely due to #1. Will replete and monitor #3. Mild  hypomagnesemia: likely related to #1. Will replete and monitor.   #3 Thrush: continue diflucan.  #4 CP: resolved. Doubt cardiac related. EKG/cardiac enzymes negative.  #5 DM: uncontrolled. HgA1c 10.4. Not able to tolerate clear liquids . On lantus and SSI. CBG range 143-261. Will increase lantus slightly. Monitor.  #6. Hypothyroidism: TSH 0.10. Pt on 230mcg po at home and 130mcg IV now. Will decrease to 87.75mcg IV. Will need OP follow up to dose change.  #7 Dispo: Lives at home with family. Discharge to home when medically stable.   LOS: 2 days   [facility] M 08/15/2011, 10:33 AM

## 2011-08-15 NOTE — Evaluation (Signed)
Occupational Therapy Evaluation Patient Details Name: Amy [name] MRN: [name] DOB: [birth year] Today's Date: 08/15/2011  Problem List:  Patient Active Problem List  Diagnoses  . Gastroenteritis  . Hypotension  . Dehydration  . Blindness  . DM type 1 causing eye disease, not at goal  . Asthma  . Diarrhea  . Vomiting  . Hypothyroidism  . Diabetic neuropathy  . UTI (lower urinary tract infection)  . Diabetic nephropathy  . Acute gastroenteritis  . Gastroparesis  . Chest pain  . DM (diabetes mellitus)  . Oral thrush    Past Medical History:  Past Medical History  Diagnosis Date  . Asthma   . Diabetes mellitus   . Hypertension   . Blind   . Hypothyroidism   . Hyperlipidemia   . Diabetic neuropathy    Past Surgical History:  Past Surgical History  Procedure Date  . Cesarean section     x 2  . Abdominal hysterectomy   . Cholecystectomy     OT Assessment/Plan/Recommendation OT Assessment Clinical Impression Statement: This is a 51 year old female, with known history of Insulin-requiring type 2 DM, HTN, dyslipidemia, hypothyroidism, anxiety, panic attacks, depression, gastroparesis, diabetic retinopathy and blindness, osteoarthritis, s/p Cholecystectomy, s/p hysterectomy and cataract surgery, presenting with above symptoms. According to patient, her symptoms started 3 days ago, and she has had vomiting about 3 times daily, and watery diarrhea about 4-5 times per day, associated with crampy bilateral lower quadrant pain. Pt displays some pain in her stomach and decreased strength and will benefit from skilled OT while in the hospital to ensure safe return home with caregiver support.  OT Recommendation/Assessment: Patient will need skilled OT in the acute care venue OT Problem List: Decreased strength;Pain;Decreased knowledge of use of DME or AE OT Therapy Diagnosis : Generalized weakness;Acute pain OT Plan OT Frequency: Min 2X/week OT Treatment/Interventions:  Self-care/ADL training;Therapeutic activities;DME and/or AE instruction;Patient/family education OT Recommendation Follow Up Recommendations: No OT follow up;Other (comment) (continued aide assist with ADL) Equipment Recommended: None recommended by OT Individuals Consulted Consulted and Agree with Results and Recommendations: Patient OT Goals Acute Rehab OT Goals OT Goal Formulation: With patient Time For Goal Achievement: 2 weeks ADL Goals Pt Will Perform Grooming: with supervision;Standing at sink;Other (comment) (3 tasks) ADL Goal: Grooming - Progress: Goal set today Pt Will Perform Lower Body Dressing: with supervision;Sit to stand from chair;Sit to stand from bed ADL Goal: Lower Body Dressing - Progress: Goal set today Pt Will Transfer to Toilet: with supervision;Ambulation;with DME;3-in-1 ADL Goal: Toilet Transfer - Progress: Goal set today Pt Will Perform Toileting - Clothing Manipulation: with supervision;Standing ADL Goal: Toileting - Clothing Manipulation - Progress: Goal set today Pt Will Perform Toileting - Hygiene: with supervision;Sit to stand from 3-in-1/toilet ADL Goal: Toileting - Hygiene - Progress: Goal set today  OT Evaluation Precautions/Restrictions  Precautions Precautions: Other (comment) Precaution Comments: on contact precautions (pt is blind) Restrictions Weight Bearing Restrictions: No Prior Functioning Home Living Lives With: Alone Receives Help From: Personal care attendant;Other (Comment) (2 hours per day, 7 days per week) Type of Home: House Home Layout: One level Bathroom Shower/Tub: Chiropodist: Standard Home Adaptive Equipment: Straight cane;Walker - four wheeled;Bedside commode/3-in-1;Shower chair with back;Tub transfer bench Prior Function Level of Independence: Requires assistive device for independence;Needs assistance with ADLs;Needs assistance with homemaking Comments: pt has an aide for 2 hours every day that  assists with meals/household tasks and also with self care PRN. She also has hired assist for her  errands.  ADL ADL Eating/Feeding: Simulated;Set up Where Assessed - Eating/Feeding: Chair Grooming: Simulated;Wash/dry hands;Supervision/safety;Set up Grooming Details (indicate cue type and reason): supervision more for where items are located due to unfamiliar environment and pt is blind.  Where Assessed - Grooming: Sitting, chair;Other (comment) (on BSC) Upper Body Bathing: Performed;Chest;Right arm;Left arm;Abdomen;Supervision/safety;Set up Upper Body Bathing Details (indicate cue type and reason): verbal cues for locating items due to being blind and unfamiliar environment.  Where Assessed - Upper Body Bathing: Sitting, chair;Other (comment) (BSC) Lower Body Bathing: Performed;Minimal assistance Lower Body Bathing Details (indicate cue type and reason): min assist to wash posterior periarea completely. pt states it is hard for her to reach well enough to clean well.  Where Assessed - Lower Body Bathing: Sit to stand from chair;Other (comment) (sit to stand from [facility]) Upper Body Dressing: Simulated;Minimal assistance Upper Body Dressing Details (indicate cue type and reason): to manage gown with telemetry box and snaps due to pt is blind.  Where Assessed - Upper Body Dressing: Sitting, chair;Other (comment) (on BSC) Lower Body Dressing: Simulated;Minimal assistance Lower Body Dressing Details (indicate cue type and reason): min guard assist Where Assessed - Lower Body Dressing: Sit to stand from chair;Other (comment) (BSC) Toilet Transfer: Simulated;Minimal assistance Toilet Transfer Details (indicate cue type and reason): min guard assist from [facility]. Pt on commode when OT/PT arrived. See PT note for ambulation and transfer to chair.  Science writer: Engineer, civil (consulting) Manipulation: Simulated;Minimal assistance Toileting - Clothing Manipulation Details (indicate cue  type and reason): min guard assist Where Assessed - Camera operator Manipulation: Standing Toileting - Hygiene: Performed;Minimal assistance Toileting - Hygiene Details (indicate cue type and reason): to reach posterior periarea thoroughly Where Assessed - Toileting Hygiene: Sit to stand from 3-in-1 or toilet Tub/Shower Transfer: Not assessed Tub/Shower Transfer Method: Not assessed Equipment Used: Rolling walker ADL Comments: Cotx with PT. Used RW initially to ambulate with PT but then switched to HHA. Pt complaining of stomach pain. RN in room to give meds. Pt has caregiver support for her ADL 2 hours every day. Mostly assist due to her blindness and unfamiliar hospital envrionemnt.  Vision/Perception  Vision - History Baseline Vision: Legally blind Public librarian Arousal/Alertness: Awake/alert Overall Cognitive Status: Appears within functional limits for tasks assessed Orientation Level: Oriented X4 Sensation/Coordination Sensation Light Touch: Appears Intact Extremity Assessment RUE Assessment RUE Assessment: Within Functional Limits LUE Assessment LUE Assessment: Within Functional Limits Mobility  Transfers Transfers: Yes Sit to Stand: 4: Min assist;From chair/3-in-1;With upper extremity assist Sit to Stand Details (indicate cue type and reason): min verbal cues for safety due to pt is blind. Cues to push with armrests to stand and that RW located in front of her upon standing. min guard assist.  Exercises   End of Session OT - End of Session Equipment Utilized During Treatment: Gait belt Activity Tolerance: Patient tolerated treatment well Patient left: in chair;with call bell in reach General Behavior During Session: [facility] for tasks performed Cognition: [facility] for tasks performed   Jules Schick [id number] 08/15/2011, 11:43 AM

## 2011-08-16 LAB — CBC
HCT: 34.5 % — ABNORMAL LOW (ref 36.0–46.0)
MCHC: 32.2 g/dL (ref 30.0–36.0)
MCV: 77.5 fL — ABNORMAL LOW (ref 78.0–100.0)
RDW: 14.3 % (ref 11.5–15.5)
WBC: 4.5 10*3/uL (ref 4.0–10.5)

## 2011-08-16 LAB — BASIC METABOLIC PANEL
BUN: 3 mg/dL — ABNORMAL LOW (ref 6–23)
Chloride: 112 mEq/L (ref 96–112)
Creatinine, Ser: 0.53 mg/dL (ref 0.50–1.10)
GFR calc Af Amer: >90 mL/min (ref >90)
Glucose, Bld: 136 mg/dL — ABNORMAL HIGH (ref 70–99)

## 2011-08-16 LAB — GLUCOSE, CAPILLARY
Glucose-Capillary: 130 mg/dL — ABNORMAL HIGH (ref 70–99)
Glucose-Capillary: 136 mg/dL — ABNORMAL HIGH (ref 70–99)
Glucose-Capillary: 139 mg/dL — ABNORMAL HIGH (ref 70–99)

## 2011-08-16 MED ORDER — FLUCONAZOLE 10 MG/ML PO SUSR: ORAL | Status: DC

## 2011-08-16 MED ORDER — METOCLOPRAMIDE HCL 10 MG PO TABS: 10.0000 mg | ORAL_TABLET | Freq: Three times a day (TID) | ORAL | Status: DC

## 2011-08-16 MED ORDER — LEVOTHYROXINE SODIUM 175 MCG PO TABS: 200.0000 ug | ORAL_TABLET | Freq: Every day | ORAL | Status: DC

## 2011-08-16 MED ORDER — METOCLOPRAMIDE HCL 10 MG PO TABS
10.0000 mg | ORAL_TABLET | Freq: Three times a day (TID) | ORAL | Status: DC
  Filled 2011-08-16 (×6): qty 1

## 2011-08-16 NOTE — Discharge Instructions (Addendum)
Nausea and Vomiting  Nausea is a sick feeling that often comes before throwing up (vomiting). Vomiting is a reflex where stomach contents come out of your mouth. Vomiting can cause severe loss of body fluids (dehydration). Children and elderly adults can become dehydrated quickly, especially if they also have diarrhea. Nausea and vomiting are symptoms of a condition or disease. It is important to find the cause of your symptoms.  CAUSES    Direct irritation of the stomach lining. This irritation can result from increased acid production (gastroesophageal reflux disease), infection, food poisoning, taking certain medicines (such as nonsteroidal anti-inflammatory drugs), alcohol use, or tobacco use.   Signals from the brain.These signals could be caused by a headache, heat exposure, an inner ear disturbance, increased pressure in the brain from injury, infection, a tumor, or a concussion, pain, emotional stimulus, or metabolic problems.   An obstruction in the gastrointestinal tract (bowel obstruction).   Illnesses such as diabetes, hepatitis, gallbladder problems, appendicitis, kidney problems, cancer, sepsis, atypical symptoms of a heart attack, or eating disorders.   Medical treatments such as chemotherapy and radiation.   Receiving medicine that makes you sleep (general anesthetic) during surgery.  DIAGNOSIS  Your caregiver may ask for tests to be done if the problems do not improve after a few days. Tests may also be done if symptoms are severe or if the reason for the nausea and vomiting is not clear. Tests may include:   Urine tests.   Blood tests.   Stool tests.   Cultures (to look for evidence of infection).   X-rays or other imaging studies.  Test results can help your caregiver make decisions about treatment or the need for additional tests.  TREATMENT  You need to stay well hydrated. Drink frequently but in small amounts.You may wish to drink water, sports drinks, clear broth, or eat frozen  ice pops or gelatin dessert to help stay hydrated.When you eat, eating slowly may help prevent nausea.There are also some antinausea medicines that may help prevent nausea.  HOME CARE INSTRUCTIONS    Take all medicine as directed by your caregiver.   If you do not have an appetite, do not force yourself to eat. However, you must continue to drink fluids.   If you have an appetite, eat a normal diet unless your caregiver tells you differently.   Eat a variety of complex carbohydrates (rice, wheat, potatoes, bread), lean meats, yogurt, fruits, and vegetables.   Avoid high-fat foods because they are more difficult to digest.   Drink enough water and fluids to keep your urine clear or pale yellow.   If you are dehydrated, ask your caregiver for specific rehydration instructions. Signs of dehydration may include:   Severe thirst.   Dry lips and mouth.   Dizziness.   Dark urine.   Decreasing urine frequency and amount.   Confusion.   Rapid breathing or pulse.  SEEK IMMEDIATE MEDICAL CARE IF:    You have blood or brown flecks (like coffee grounds) in your vomit.   You have Emily Massar or bloody stools.   You have a severe headache or stiff neck.   You are confused.   You have severe abdominal pain.   You have chest pain or trouble breathing.   You do not urinate at least once every 8 hours.   You develop cold or clammy skin.   You continue to vomit for longer than 24 to 48 hours.   You have a fever.  MAKE SURE YOU:      Understand these instructions.   Will watch your condition.   Will get help right away if you are not doing well or get worse.  Document Released: 05/02/2005 Document Revised: 04/21/2011 Document Reviewed: 09/29/2010  [employer] Patient Information 2012 [employer].

## 2011-08-16 NOTE — Clinical Documentation Improvement (Signed)
GENERIC DOCUMENTATION CLARIFICATION QUERY  THIS DOCUMENT IS NOT A PERMANENT PART OF THE MEDICAL RECORD  TO RESPOND TO THE THIS QUERY, FOLLOW THE INSTRUCTIONS BELOW:  1. If needed, update documentation for the patient's encounter via the notes activity.  2. Access this query again and click edit on the [facility] [employer].  3. After updating, or not, click F2 to complete all highlighted (required) fields concerning your review. Select "additional documentation in the medical record" OR "no additional documentation provided".  4. Click Sign note button.  5. The deficiency will fall out of your In Basket *Please let us know if you are not able to complete this workflow by phone or e-mail (listed below).  Please update your documentation within the medical record to reflect your response to this query.                                                                                        08/16/11   Dear Dr. Isaac Bliss, [facility],  In a better effort to capture your patient's severity of illness, reflect appropriate length of stay and utilization of resources, a review of the patient medical record has revealed the following indicators.    Based on your clinical judgment, please clarify and document in a progress note and/or discharge summary the clinical condition associated with the following supporting information:  In responding to this query please exercise your independent judgment.  The fact that a query is asked, does not imply that any particular answer is desired or expected.  Pt with acute gastroenteritis  According to [facility] pt with gastroparesis in setting of DM.    Please clarify if the gastroparesis is linked to the DM and document in pn and d/c summary.  Thank you!     Possible Clinical Conditions?  _______Other Condition__________________ _______Cannot Clinically Determine   Supporting Information:  Risk Factors: Acute gastroenteritis, gastroparesis, DM,  Dyslipidemia, oral thrush, Diabetic retinopathy,   Signs & Symptoms: PN 08/15/11 DM: uncontrolled. HgA1c 10.4. Not able to tolerate clear liquids . On lantus and SSI. CBG range 143-261. Will increase lantus slightly. Monitor  H/P DM (diabetes mellitus)this appears controlled, based on random blood glucose. We shall manage with SSI, and appropriate diet, when oral intake is more reliable. Meanwhile, will check HBA1C for completeness Gastroparesis: Patient's known gastroparesis, may have been exacerbated by above. We shall manage with antiemetics, but avoid Reglan for now, as this may worsen intestinal hurry. According to patient, her symptoms started 3 days ago, and she has had vomiting about 3 times daily, and watery diarrhea about 4-5 times per day, associated with crampy bilateral lower quadrant pain  Diagnostics: Component     Latest Ref Rng 08/15/2011  Glucose     70 - 99 mg/dL 165 (H)   Component     Latest Ref Rng 08/16/2011  Glucose     70 - 99 mg/dL 136 (H)   Treatment insulin glargine (LANTUS) injection 17 Units   You may use possible, probable, or suspect with inpatient documentation. possible, probable, suspected diagnoses MUST be documented at the time of discharge  Reviewed: additional documentation in the  medical record  Thank You,  Heloise Beecham  RN, BSN, CCDS Clinical Documentation Specialist [facility] Pager: [phone] / E-mail: [email or web address]

## 2011-08-16 NOTE — Discharge Summary (Addendum)
Physician Discharge Summary  Patient ID: Amy [name] MRN: [id number] DOB/AGE: 24-Mar-1961 51 y.o.  Admit date: 08/13/2011 Discharge date: 08/16/2011  Primary Care Physician:  [name], MD, MD   Discharge Diagnoses:    Principal Problem:  *Acute gastroenteritis Active Problems:  Diabetic Gastroparesis  Chest pain  DM (diabetes mellitus)  Oral thrush   Medication List  As of 08/16/2011  1:52 PM   STOP taking these medications         aspirin 325 MG tablet         TAKE these medications         albuterol 108 (90 BASE) MCG/ACT inhaler   Commonly known as: PROVENTIL HFA;VENTOLIN HFA   Inhale 2 puffs into the lungs every 4 (four) hours as needed. For shortness of breath      dextromethorphan-guaiFENesin 10-100 MG/5ML liquid   Commonly known as: ROBITUSSIN-DM   Take 5 mLs by mouth every 4 (four) hours as needed. For cold/flu symptom relief      insulin aspart 100 UNIT/ML injection   Commonly known as: novoLOG   Inject 20 Units into the skin 3 (three) times daily before meals.      insulin glargine 100 UNIT/ML injection   Commonly known as: LANTUS   Inject 30 Units into the skin at bedtime.      KOMBIGLYZE XR 5-500 MG Tb24   Generic drug: Saxagliptin-Metformin   Take 1 tablet by mouth daily.      levothyroxine 175 MCG tablet   Commonly known as: SYNTHROID, LEVOTHROID   Take 1 tablet (175 mcg total) by mouth daily.      metoCLOPramide 10 MG tablet   Commonly known as: REGLAN   Take 1 tablet (10 mg total) by mouth 4 (four) times daily -  before meals and at bedtime.      montelukast 10 MG tablet   Commonly known as: SINGULAIR   Take 10 mg by mouth at bedtime.      Naproxen Sodium 220 MG Caps   Take 2 capsules by mouth.      pantoprazole 40 MG tablet   Commonly known as: PROTONIX   Take 40 mg by mouth daily.      potassium chloride SA 20 MEQ tablet   Commonly known as: K-DUR,KLOR-CON   Take 20 mEq by mouth daily.      pregabalin 50 MG capsule   Commonly known as: LYRICA   Take 50 mg by mouth 2 (two) times daily.      ramipril 5 MG capsule   Commonly known as: ALTACE   Take 5 mg by mouth daily.      simvastatin 20 MG tablet   Commonly known as: ZOCOR   Take 20 mg by mouth every evening.      vitamin B-12 1000 MCG tablet   Commonly known as: CYANOCOBALAMIN   Take 1,000 mcg by mouth daily.             Disposition and Follow-up: Pt medically stable and ready for discharge to home. Will need follow up with PCP in 1-2weeks. Will need bmet monitor renal function and potassium level. Will need BP monitoring for optimal control will need TSH in 4-6 weeks as synthroid dose decreased.   Consults:  None  Physical exam: General: Alert, awake, oriented x3, in no acute distress. On bedside commode. Reports that tolerated regular diet at lunch today. Denies nausea/abd pain  HEENT: No bruits, no goiter. Blind  Heart: Regular rate and rhythm,  without murmurs, rubs, gallops.  Lungs: Normal effort. Breath sounds clear to auscultation bilaterally. No wheeze  Abdomen: Soft, nontender, nondistended, +BS  Extremities: No clubbing cyanosis or edema with positive pedal pulses.  Neuro: Grossly intact, nonfocal.    Significant Diagnostic Studies:  Ct Abdomen Pelvis Wo Contrast  08/13/2011  *RADIOLOGY REPORT*  Clinical Data: Abdominal pain, nausea, diarrhea.  CT ABDOMEN AND PELVIS WITHOUT CONTRAST  Technique:  Multidetector CT imaging of the abdomen and pelvis was performed following the standard protocol without intravenous contrast.  Comparison: Plain film 05/26/2011.  Multiple CTs, the most recent 04/03/2009.  MRI 07/03/2006.  Findings: Lung bases are clear.  No effusions.  Heart is normal size.  Prior cholecystectomy. Spleen, pancreas, adrenals and kidneys are unremarkable.  While difficult to see on the study without IV contrast, there is a low density lesion within the right hepatic lobe measuring 3.6 x 1.9 cm. When comparing to multiple old  studies, including abdominal MRI dating back to 200 no acute bony abnormality.  8, this area was shown by prior MRI to most compatible with hemangioma.  This has enlarged since prior study.  Normal retrocecal appendix.  Large and small bowel grossly unremarkable.  No free fluid or free air.  Small retroperitoneal lymph nodes are stable since prior study.  Aorta is normal caliber.  IMPRESSION: Enlarging hypodensity in the right hepatic lobe.  This was shown on prior MRI to be the most compatible with hemangioma.  No acute findings in the abdomen or pelvis.  Original Report Authenticated By: Raelyn Number, M.D.   Dg Chest 2 View  08/13/2011  *RADIOLOGY REPORT*  Clinical Data: Chest pain, shortness of breath.  CHEST - 2 VIEW  Comparison: 03/11/2011  Findings: Heart is borderline in size.  Lungs are clear.  No effusions or edema.  No acute bony abnormality.  IMPRESSION: No active cardiopulmonary disease.  Original Report Authenticated By: Raelyn Number, M.D.    Labs Reviewed  CBC - Abnormal; Notable for the following:    WBC 15.9 (*)    RBC 5.39 (*)    MCV 76.8 (*)    MCH 25.6 (*)    All other components within normal limits  BASIC METABOLIC PANEL - Abnormal; Notable for the following:    Glucose, Bld 133 (*)    GFR calc non Af Amer 82 (*)    All other components within normal limits  PRO B NATRIURETIC PEPTIDE - Abnormal; Notable for the following:    Pro B Natriuretic peptide (BNP) 158.3 (*)    All other components within normal limits  GLUCOSE, CAPILLARY - Abnormal; Notable for the following:    Glucose-Capillary 107 (*)    All other components within normal limits  D-DIMER, QUANTITATIVE - Abnormal; Notable for the following:    D-Dimer, Quant 0.91 (*)    All other components within normal limits  HEMOGLOBIN A1C - Abnormal; Notable for the following:    Hemoglobin A1C 10.4 (*)    Mean Plasma Glucose 252 (*)    All other components within normal limits  TSH - Abnormal; Notable for the  following:    TSH 0.010 (*)    All other components within normal limits  MAGNESIUM - Abnormal; Notable for the following:    Magnesium 1.4 (*)    All other components within normal limits  COMPREHENSIVE METABOLIC PANEL - Abnormal; Notable for the following:    Glucose, Bld 116 (*)    Calcium 8.1 (*)    Total Protein  5.9 (*)    Albumin 2.6 (*)    Total Bilirubin 0.2 (*)    GFR calc non Af Amer 74 (*)    GFR calc Af Amer 86 (*)    All other components within normal limits  CBC - Abnormal; Notable for the following:    Hemoglobin 11.1 (*)    HCT 33.4 (*)    MCV 76.6 (*)    MCH 25.5 (*)    All other components within normal limits  GLUCOSE, CAPILLARY - Abnormal; Notable for the following:    Glucose-Capillary 111 (*)    All other components within normal limits  GLUCOSE, CAPILLARY - Abnormal; Notable for the following:    Glucose-Capillary 146 (*)    All other components within normal limits  GLUCOSE, CAPILLARY - Abnormal; Notable for the following:    Glucose-Capillary 110 (*)    All other components within normal limits  GLUCOSE, CAPILLARY - Abnormal; Notable for the following:    Glucose-Capillary 195 (*)    All other components within normal limits  GLUCOSE, CAPILLARY - Abnormal; Notable for the following:    Glucose-Capillary 182 (*)    All other components within normal limits  BASIC METABOLIC PANEL - Abnormal; Notable for the following:    Potassium 3.4 (*)    Chloride 113 (*)    Glucose, Bld 165 (*)    Calcium 7.9 (*)    All other components within normal limits  CBC - Abnormal; Notable for the following:    WBC 3.7 (*)    Hemoglobin 11.0 (*)    HCT 33.6 (*)    MCV 77.2 (*)    MCH 25.3 (*)    All other components within normal limits  GLUCOSE, CAPILLARY - Abnormal; Notable for the following:    Glucose-Capillary 261 (*)    All other components within normal limits  GLUCOSE, CAPILLARY - Abnormal; Notable for the following:    Glucose-Capillary 143 (*)    All  other components within normal limits  GLUCOSE, CAPILLARY - Abnormal; Notable for the following:    Glucose-Capillary 169 (*)    All other components within normal limits  GLUCOSE, CAPILLARY - Abnormal; Notable for the following:    Glucose-Capillary 149 (*)    All other components within normal limits  GLUCOSE, CAPILLARY - Abnormal; Notable for the following:    Glucose-Capillary 196 (*)    All other components within normal limits  CBC - Abnormal; Notable for the following:    Hemoglobin 11.1 (*)    HCT 34.5 (*)    MCV 77.5 (*)    MCH 24.9 (*)    All other components within normal limits  BASIC METABOLIC PANEL - Abnormal; Notable for the following:    Glucose, Bld 136 (*)    BUN 3 (*)    Calcium 7.9 (*)    All other components within normal limits  GLUCOSE, CAPILLARY - Abnormal; Notable for the following:    Glucose-Capillary 140 (*)    All other components within normal limits  GLUCOSE, CAPILLARY - Abnormal; Notable for the following:    Glucose-Capillary 191 (*)    All other components within normal limits  GLUCOSE, CAPILLARY - Abnormal; Notable for the following:    Glucose-Capillary 139 (*)    All other components within normal limits  GLUCOSE, CAPILLARY - Abnormal; Notable for the following:    Glucose-Capillary 136 (*)    All other components within normal limits  GLUCOSE, CAPILLARY - Abnormal; Notable for the following:  Glucose-Capillary 130 (*)    All other components within normal limits  GLUCOSE, CAPILLARY - Abnormal; Notable for the following:    Glucose-Capillary 190 (*)    All other components within normal limits  TROPONIN I  URINALYSIS, ROUTINE W REFLEX MICROSCOPIC  LIPASE, BLOOD  POCT I-STAT TROPONIN I  LIPID PANEL  CARDIAC PANEL(CRET KIN+CKTOT+MB+TROPI)  CARDIAC PANEL(CRET KIN+CKTOT+MB+TROPI)  CARDIAC PANEL(CRET KIN+CKTOT+MB+TROPI)  MRSA PCR SCREENING  STOOL CULTURE  FECAL LACTOFERRIN  CLOSTRIDIUM DIFFICILE BY PCR  OVA AND PARASITE EXAMINATION          Ct Abdomen Pelvis Wo Contrast  08/13/2011  *RADIOLOGY REPORT*  Clinical Data: Abdominal pain, nausea, diarrhea.  CT ABDOMEN AND PELVIS WITHOUT CONTRAST  Technique:  Multidetector CT imaging of the abdomen and pelvis was performed following the standard protocol without intravenous contrast.  Comparison: Plain film 05/26/2011.  Multiple CTs, the most recent 04/03/2009.  MRI 07/03/2006.  Findings: Lung bases are clear.  No effusions.  Heart is normal size.  Prior cholecystectomy. Spleen, pancreas, adrenals and kidneys are unremarkable.  While difficult to see on the study without IV contrast, there is a low density lesion within the right hepatic lobe measuring 3.6 x 1.9 cm. When comparing to multiple old studies, including abdominal MRI dating back to 200 no acute bony abnormality.  8, this area was shown by prior MRI to most compatible with hemangioma.  This has enlarged since prior study.  Normal retrocecal appendix.  Large and small bowel grossly unremarkable.  No free fluid or free air.  Small retroperitoneal lymph nodes are stable since prior study.  Aorta is normal caliber.  IMPRESSION: Enlarging hypodensity in the right hepatic lobe.  This was shown on prior MRI to be the most compatible with hemangioma.  No acute findings in the abdomen or pelvis.  Original Report Authenticated By: Raelyn Number, M.D.   Dg Chest 2 View  08/13/2011  *RADIOLOGY REPORT*  Clinical Data: Chest pain, shortness of breath.  CHEST - 2 VIEW  Comparison: 03/11/2011  Findings: Heart is borderline in size.  Lungs are clear.  No effusions or edema.  No acute bony abnormality.  IMPRESSION: No active cardiopulmonary disease.  Original Report Authenticated By: Raelyn Number, M.D.       Brief H and P: For complete details please refer to admission H and P, but in brief  This is a 51 year old female, with known history of Insulin-requiring type 2 DM, HTN, dyslipidemia, hypothyroidism, anxiety, panic attacks, depression,  gastroparesis, diabetic retinopathy and blindness, osteoarthritis, s/p Cholecystectomy, s/p hysterectomy and cataract surgery, presenting [facility] ED on 08/13/11 with cc vomiting/diarrhe/chest pain. According to patient, her symptoms started 3 days prior, and she  had vomiting about 3 times daily, and watery diarrhea about 4-5 times per day, associated with crampy bilateral lower quadrant pain. She denied fever, chill, recent travel, sick contacts or ingestion of unusual foods. Patient also, had retrosternal pain, radiating to her back, intermitent over the priof 3 days, without cough, or shortness of breath. Hospitalist were asked to admit.    Hospital Course:   Principal Problem:  *Acute gastroenteritis Active Problems:  Gastroparesis  Chest pain  DM (diabetes mellitus)  Oral thrush #1 N/V/Diarrhea: Pt admitted to tele. Initially thought to have gastroenteritis. Started on cipro and flagyl given elevated wc. No further diarrhea for stool studies. Placed on clear liquids, iv fluids, zofran. Continued with nausea and dry heaves. Reglan restarted and antibiotics discontinued as  Diarrhea resolved and it was  presumed her diabetic gastroparesis main problem. Reglan started. Pt improved. Diet advanced. At time of discharge tolerating regular carb modified diet. No nausea/vomiting.   #2. Mild hypokalemia: likely due to #1. Repleted and resolved #3. Mild hypomagnesemia: likely related to #1. Repleted  #3 Thrush: continue diflucan.  #4 CP: resolved. Doubt cardiac related. EKG/cardiac enzymes negative.  #5 DM: uncontrolled. HgA1c 10.4. During hospitalization on lantus reduced home dose and SSI. At discharge appetite and food consumption at baseline. Will discharge on home regimen. Recommend close follow up with PCP for optimum control.   #6. Hypothyroidism: TSH 0.10. Pt on 250mcg po at home and 120mcg IV at admission. Decreased to 87.32mcg IV in hospital. At time of discharge home dose decreased to 17mcg po.  wi ill need OP follow up to check TSH 4-6 weeks.       Time spent on Discharge: 40 minutes  Signed: Radene Gunning 08/16/2011, 1:52 PM

## 2011-08-16 NOTE — Discharge Summary (Signed)
Patient seen and examined. Agree with D/C Summary by Dyanne Carrel, NP. I believe she likely had viral gastroenteritis on top of gastroparesis. She is now tolerating a solid diet. Her N/V/D has resolved. Has been started on reglan. Has been deemed stable for discharge home today.  Domingo Mend, MD [facility] Pager: [phone]

## 2011-09-19 ENCOUNTER — Other Ambulatory Visit: Payer: Self-pay | Admitting: Ophthalmology

## 2011-09-28 IMAGING — CR DG CHEST 2V
2 series · 2 of 2 positions shown · non-contrast
Comparison: 04/03/2009

CLINICAL DATA: The chest pain and weakness.

CHEST - 2 VIEW

[w chest lat]
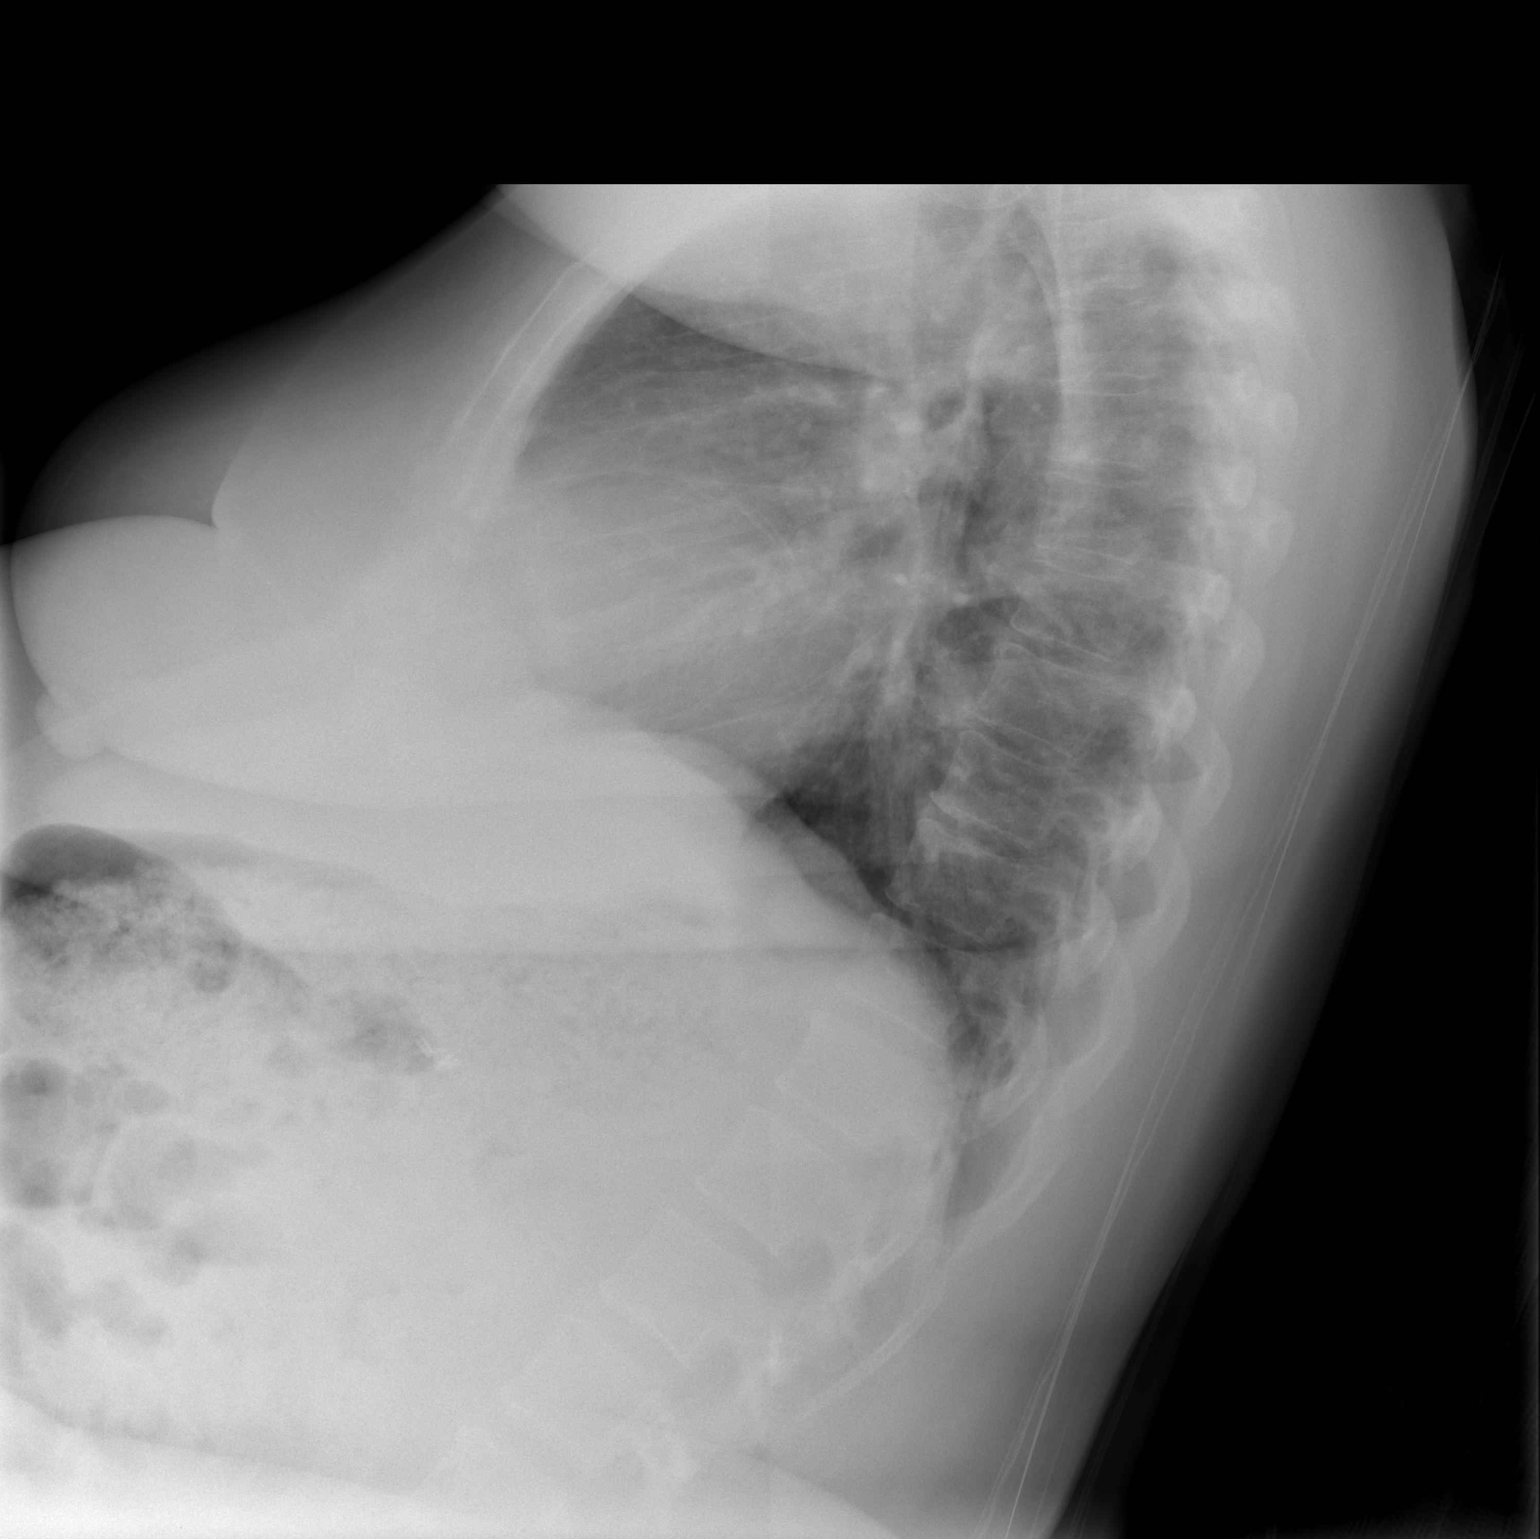

[w chest pa]
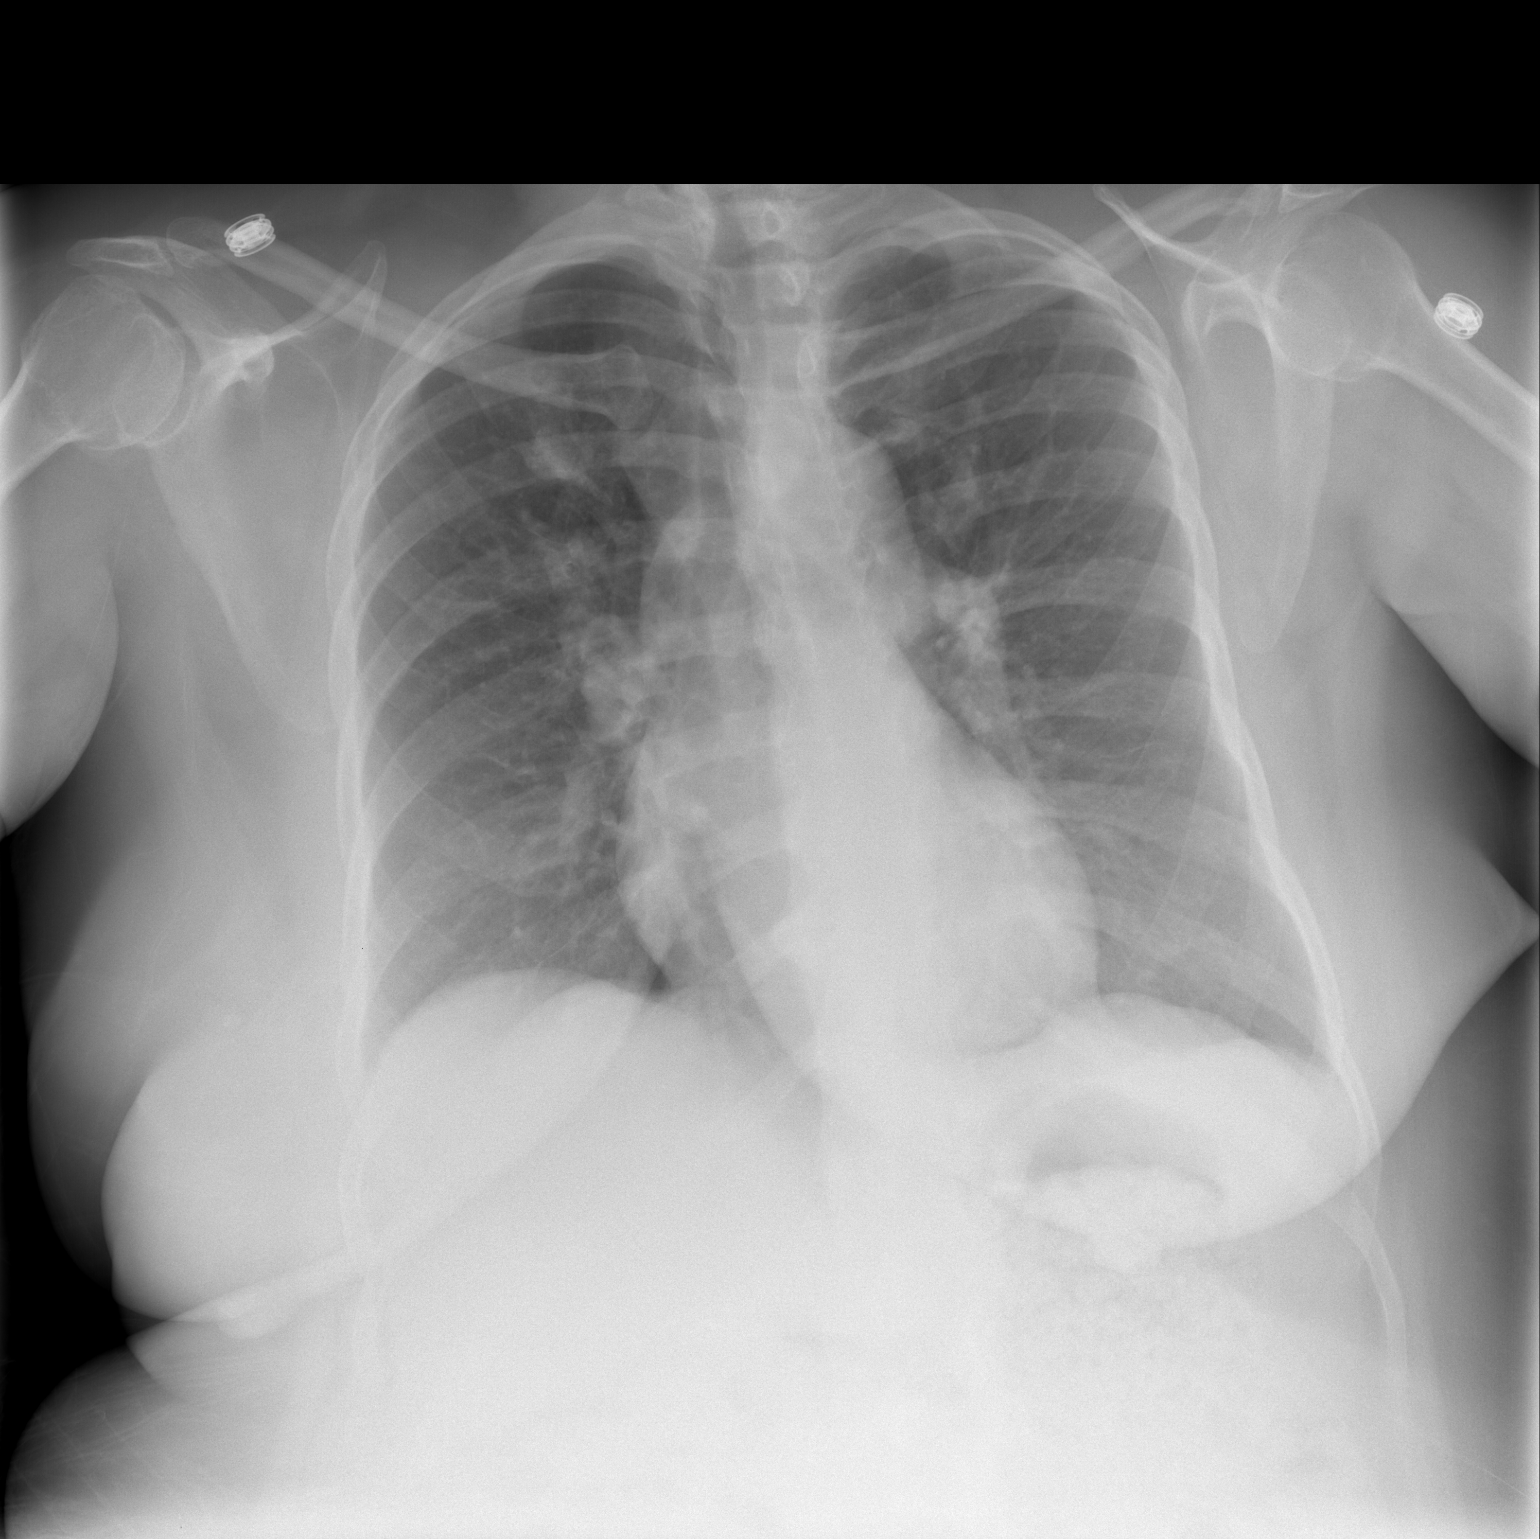

[2 of 2 positions shown; findings below may reference images not displayed]

FINDINGS: The cardiac silhouette, mediastinal and hilar contours
are within normal limits and stable.  The lungs are clear.  There
are chronic scarring changes and calcified hilar lymph nodes.  The
bony thorax is intact.
IMPRESSION: Mild chronic lung changes but no acute pulmonary findings.

## 2011-11-01 ENCOUNTER — Emergency Department (INDEPENDENT_AMBULATORY_CARE_PROVIDER_SITE_OTHER)
Admission: EM | Admit: 2011-11-01 | Discharge: 2011-11-01 | Disposition: A | Discharge status: Home / Self Care | Payer: PRIVATE HEALTH INSURANCE | Source: Home / Self Care | Attending: Emergency Medicine | Admitting: Emergency Medicine

## 2011-11-01 ENCOUNTER — Encounter (HOSPITAL_COMMUNITY): Payer: Self-pay | Admitting: *Deleted

## 2011-11-01 DIAGNOSIS — L0291 Cutaneous abscess, unspecified: Secondary | ICD-10-CM

## 2011-11-01 DIAGNOSIS — L0390 Cellulitis, unspecified: Secondary | ICD-10-CM

## 2011-11-01 MED ORDER — SULFAMETHOXAZOLE-TMP DS 800-160 MG PO TABS: 2.0000 | ORAL_TABLET | Freq: Two times a day (BID) | ORAL | Status: AC

## 2011-11-01 NOTE — ED Provider Notes (Signed)
Chief Complaint  Patient presents with  . Cellulitis    History of Present Illness:   Amy [name] is a 51 year old diabetic who has had a two-day history of pain and swelling over her right elbow. She denies any trauma. She's had no fever or chills. There's been no drainage from the area. She also has a couple of painful nodules in her left axilla. She has no prior history of MRSA, skin abscesses, or skin infections.  Review of Systems:  Other than noted above, the patient denies any of the following symptoms: Systemic:  No fever, chills, sweats, weight loss, or fatigue. ENT:  No nasal congestion, rhinorrhea, sore throat, swelling of lips, tongue or throat. Resp:  No cough, wheezing, or shortness of breath. Skin:  No rash, itching, nodules, or suspicious lesions.  [place]:  Past medical history, family history, social history, meds, and allergies were reviewed.  Physical Exam:   Vital signs:  BP 100/56  Pulse 99  Temp 98.6 F (37 C) (Oral)  Resp 16  SpO2 99% Gen:  Alert, oriented, in no distress. ENT:  Pharynx clear, no intraoral lesions, moist mucous membranes. Lungs:  Clear to auscultation. Skin:   There is a 3.5 cm area of erythema with a small central crust overlying the right olecranon. This was tender to palpation but there was no fluctuance and did not appear to be any swelling or fluid in the olecranon bursa. Also in the left axilla she has 2 tiny nodules consistent with infected sweat glands. The area was outlined with a skin marker pen.     Assessment:  The encounter diagnosis was Cellulitis.  Plan:   1.  The following meds were prescribed:   New Prescriptions   SULFAMETHOXAZOLE-TRIMETHOPRIM (BACTRIM DS) 800-160 MG PER TABLET    Take 2 tablets by mouth 2 (two) times daily.   2.  The patient was instructed in symptomatic care and handouts were given. 3.  The patient was told to return if becoming worse in any way, if no better in 3 or 4 days, and given some red flag  symptoms that would indicate earlier return.  Follow up:  The patient was told to follow up she is to return again in 48 hours for recheck.      Harden Mo, MD 11/01/11 2019

## 2011-11-01 NOTE — ED Notes (Signed)
Pt reports a painful, reddened, swollen area near her right elbow X 2 days and 2 small painful areas on her left axilla X 4 days

## 2011-11-01 NOTE — Discharge Instructions (Signed)

## 2011-11-02 NOTE — ED Notes (Signed)
Doxycycline 100mg  bid x 10 days called to [facility] per dr Alphonzo Cruise

## 2011-11-02 NOTE — ED Notes (Signed)
Per pt sulfa medication took x one n&v

## 2011-12-30 ENCOUNTER — Emergency Department (HOSPITAL_COMMUNITY)
Admission: EM | Admit: 2011-12-30 | Discharge: 2011-12-30 | Disposition: A | Discharge status: Home / Self Care | Payer: PRIVATE HEALTH INSURANCE | Attending: Emergency Medicine | Admitting: Emergency Medicine

## 2011-12-30 ENCOUNTER — Emergency Department (HOSPITAL_COMMUNITY): Payer: PRIVATE HEALTH INSURANCE

## 2011-12-30 ENCOUNTER — Encounter (HOSPITAL_COMMUNITY): Payer: Self-pay | Admitting: Family Medicine

## 2011-12-30 DIAGNOSIS — Z9089 Acquired absence of other organs: Secondary | ICD-10-CM | POA: Insufficient documentation

## 2011-12-30 DIAGNOSIS — E1169 Type 2 diabetes mellitus with other specified complication: Secondary | ICD-10-CM | POA: Insufficient documentation

## 2011-12-30 DIAGNOSIS — I10 Essential (primary) hypertension: Secondary | ICD-10-CM | POA: Insufficient documentation

## 2011-12-30 DIAGNOSIS — J45909 Unspecified asthma, uncomplicated: Secondary | ICD-10-CM | POA: Insufficient documentation

## 2011-12-30 DIAGNOSIS — E785 Hyperlipidemia, unspecified: Secondary | ICD-10-CM | POA: Insufficient documentation

## 2011-12-30 DIAGNOSIS — J069 Acute upper respiratory infection, unspecified: Secondary | ICD-10-CM

## 2011-12-30 DIAGNOSIS — R739 Hyperglycemia, unspecified: Secondary | ICD-10-CM

## 2011-12-30 DIAGNOSIS — E039 Hypothyroidism, unspecified: Secondary | ICD-10-CM | POA: Insufficient documentation

## 2011-12-30 DIAGNOSIS — Z87891 Personal history of nicotine dependence: Secondary | ICD-10-CM | POA: Insufficient documentation

## 2011-12-30 DIAGNOSIS — Z79899 Other long term (current) drug therapy: Secondary | ICD-10-CM | POA: Insufficient documentation

## 2011-12-30 DIAGNOSIS — H543 Unqualified visual loss, both eyes: Secondary | ICD-10-CM | POA: Insufficient documentation

## 2011-12-30 DIAGNOSIS — Z794 Long term (current) use of insulin: Secondary | ICD-10-CM | POA: Insufficient documentation

## 2011-12-30 LAB — CBC WITH DIFFERENTIAL/PLATELET
Basophils Absolute: 0 10*3/uL (ref 0.0–0.1)
Eosinophils Absolute: 0.1 10*3/uL (ref 0.0–0.7)
Eosinophils Relative: 1 % (ref 0–5)
Lymphocytes Relative: 16 % (ref 12–46)
MCV: 75.4 fL — ABNORMAL LOW (ref 78.0–100.0)
Platelets: 194 10*3/uL (ref 150–400)
RDW: 13.9 % (ref 11.5–15.5)
WBC: 9.5 10*3/uL (ref 4.0–10.5)

## 2011-12-30 LAB — BASIC METABOLIC PANEL
CO2: 26 mEq/L (ref 19–32)
Calcium: 10.4 mg/dL (ref 8.4–10.5)
GFR calc Af Amer: 76 mL/min — ABNORMAL LOW (ref >90)
GFR calc non Af Amer: 65 mL/min — ABNORMAL LOW (ref >90)
Sodium: 133 mEq/L — ABNORMAL LOW (ref 135–145)

## 2011-12-30 LAB — URINALYSIS, ROUTINE W REFLEX MICROSCOPIC
Ketones, ur: 15 mg/dL — AB
Leukocytes, UA: NEGATIVE
Nitrite: NEGATIVE
Protein, ur: NEGATIVE mg/dL
Urobilinogen, UA: 0.2 mg/dL (ref 0.0–1.0)

## 2011-12-30 LAB — GLUCOSE, CAPILLARY: Glucose-Capillary: 407 mg/dL — ABNORMAL HIGH (ref 70–99)

## 2011-12-30 LAB — RAPID STREP SCREEN (MED CTR MEBANE ONLY): Streptococcus, Group A Screen (Direct): NEGATIVE

## 2011-12-30 MED ORDER — SODIUM CHLORIDE 0.9 % IV SOLN: 1000.0000 mL | INTRAVENOUS | Status: DC

## 2011-12-30 MED ORDER — SODIUM CHLORIDE 0.9 % IV SOLN
1000.0000 mL | Freq: Once | INTRAVENOUS | Status: AC
  Administered 2011-12-30: 1000 mL via INTRAVENOUS

## 2011-12-30 MED ORDER — SODIUM CHLORIDE 0.9 % IV SOLN
INTRAVENOUS | Status: DC
  Administered 2011-12-30: 4.1 [IU]/h via INTRAVENOUS
  Filled 2011-12-30: qty 1

## 2011-12-30 MED ORDER — SODIUM CHLORIDE 0.9 % IV BOLUS (SEPSIS)
1000.0000 mL | Freq: Once | INTRAVENOUS | Status: AC
  Administered 2011-12-30: 1000 mL via INTRAVENOUS

## 2011-12-30 NOTE — ED Notes (Signed)
Pt reports feeling "sick" since Wednesday with sore throat and diarrhea. States blood sugars were running fine until today. States it was 422 this morning and she had 28 units of lantus and 10 units of novolog at 08:00.

## 2011-12-30 NOTE — ED Provider Notes (Signed)
Amy [name] 8:10PM patient discussed in sign out with Alecia Lemming PA-C. Patient with history of diabetes presents with elevated blood sugars. Currently on the glucose stabilizer protocol. No signs of DKA with normal anion gap. Patient is afebrile. She does have some upper respiratory tract symptoms with nasal congestion and rhinorrhea. Chest x-ray does not show signs of pneumonia. We'll continue to monitor blood sugars until they improve in the 200s.   9:50 PM patient sugar now 220. She continues to feel well states [name] ready to return home. Patient advised to use over-the-counter cough and cold medications for her symptoms. She will followup with her PCP.  Martie Lee, [place] 12/30/11 2152

## 2011-12-30 NOTE — ED Provider Notes (Signed)
Medical screening examination/treatment/procedure(s) were conducted as a shared visit with non-physician practitioner(s) and myself.  I personally evaluated the patient during the encounter  [name], MD 12/30/11 2324

## 2011-12-30 NOTE — ED Provider Notes (Signed)
History     CSN: [id number]  Arrival date & time 12/30/11  1453   First MD Initiated Contact with Patient 12/30/11 1548      Chief Complaint  Patient presents with  . Hyperglycemia    (Consider location/radiation/quality/duration/timing/severity/associated sxs/prior treatment) HPI Comments: Patient presents with complaint of nasal congestion, sore throat, cough, diarrhea for the past 5 days. Patient is also an insulin-dependent diabetic and has noted her blood sugar to be 500. Patient takes Lantus and NovoLog. She last took her insulin this morning. Cough is productive of green sputum. Patient states she has had subjective fever and chills. Swallowing makes her pain worse. He is treatments prior for cold symptoms. Patient denies nausea, vomiting, abdominal pain. She denies urinary symptoms. Onset gradual. Course is constant. Nothing makes symptoms better.  Patient is a 51 y.o. female presenting with pharyngitis. The history is provided by the patient.  Sore Throat This is a new problem. The current episode started in the past 7 days. The problem occurs constantly. The problem has been unchanged. Associated symptoms include congestion, coughing, a fever (subjective), a sore throat and swollen glands. Pertinent negatives include no abdominal pain, chest pain, fatigue, headaches, myalgias, nausea, neck pain, rash or vomiting. The symptoms are aggravated by swallowing. She has tried nothing for the symptoms.    Past Medical History  Diagnosis Date  . Asthma   . Diabetes mellitus   . Hypertension   . Blind   . Hypothyroidism   . Hyperlipidemia   . Diabetic neuropathy     Past Surgical History  Procedure Date  . Cesarean section     x 2  . Abdominal hysterectomy   . Cholecystectomy   . Enucleation     Family History  Problem Relation Age of Onset  . Diabetes Sister     History  Substance Use Topics  . Smoking status: Former Smoker    Quit date: 05/22/1992  . Smokeless  tobacco: Never Used  . Alcohol Use: No    OB History    Grav Para Term Preterm Abortions TAB SAB Ect Mult Living                  Review of Systems  Constitutional: Positive for fever (subjective). Negative for fatigue.  HENT: Positive for congestion, sore throat, trouble swallowing and sinus pressure. Negative for rhinorrhea, neck pain, neck stiffness and voice change.   Eyes: Negative for redness.  Respiratory: Positive for cough. Negative for shortness of breath.   Cardiovascular: Negative for chest pain.  Gastrointestinal: Negative for nausea, vomiting, abdominal pain and diarrhea.  Genitourinary: Negative for dysuria and decreased urine volume.       Increased urine and thirst.   Musculoskeletal: Negative for myalgias.  Skin: Negative for rash.  Neurological: Negative for headaches.    Allergies  Codeine; Iohexol; Penicillins; and Sulfa antibiotics  Home Medications   Current Outpatient Rx  Name Route Sig Dispense Refill  . ALBUTEROL SULFATE HFA 108 (90 BASE) MCG/ACT IN AERS Inhalation Inhale 2 puffs into the lungs every 4 (four) hours as needed. For shortness of breath    . INSULIN ASPART 100 UNIT/ML [place] SOLN Subcutaneous Inject 20 Units into the skin 3 (three) times daily as needed.     . INSULIN GLARGINE 100 UNIT/ML [place] SOLN Subcutaneous Inject 28 Units into the skin daily.     Marland Kitchen LEVOTHYROXINE SODIUM 175 MCG PO TABS Oral Take 200 mcg by mouth daily.    Marland Kitchen MONTELUKAST SODIUM 10  MG PO TABS Oral Take 10 mg by mouth at bedtime.      Marland Kitchen PANTOPRAZOLE SODIUM 40 MG PO TBEC Oral Take 40 mg by mouth daily.     Marland Kitchen POTASSIUM CHLORIDE CRYS ER 20 MEQ PO TBCR Oral Take 20 mEq by mouth daily.     Marland Kitchen PREGABALIN 50 MG PO CAPS Oral Take 50 mg by mouth 2 (two) times daily.      Marland Kitchen RAMIPRIL 5 MG PO CAPS Oral Take 5 mg by mouth daily.      Marland Kitchen SIMVASTATIN 20 MG PO TABS Oral Take 20 mg by mouth every evening.      Marland Kitchen VITAMIN B-12 1000 MCG PO TABS Oral Take 1,000 mcg by mouth daily.      Marland Kitchen  METOCLOPRAMIDE HCL 10 MG PO TABS Oral Take 1 tablet (10 mg total) by mouth 4 (four) times daily -  before meals and at bedtime. 60 tablet 0    BP 96/61  Pulse 100  Temp 98.8 F (37.1 C) (Oral)  Resp 18  SpO2 98%  Physical Exam  Nursing note and vitals reviewed. Constitutional: She appears well-developed and well-nourished.  HENT:  Head: Normocephalic and atraumatic. No trismus in the jaw.  Right Ear: Tympanic membrane, external ear and ear canal normal.  Left Ear: Tympanic membrane, external ear and ear canal normal.  Nose: Mucosal edema present. No rhinorrhea.  Mouth/Throat: Uvula is midline. Mucous membranes are dry. No uvula swelling. Posterior oropharyngeal edema and posterior oropharyngeal erythema present. No oropharyngeal exudate or tonsillar abscesses.  Eyes: Conjunctivae are normal. Right eye exhibits no discharge. Left eye exhibits no discharge.       Left eye prosthesis.   Neck: Normal range of motion. Neck supple.  Cardiovascular: Normal rate, regular rhythm and normal heart sounds.   Pulmonary/Chest: Effort normal and breath sounds normal.       No Kussmaul respirations, no tachypnea, coughing during interview.   Abdominal: Soft. There is no tenderness.  Lymphadenopathy:    She has cervical adenopathy.  Neurological: She is alert.  Skin: Skin is warm and dry.  Psychiatric: She has a normal mood and affect.    ED Course  Procedures (including critical care time)  Labs Reviewed  GLUCOSE, CAPILLARY - Abnormal; Notable for the following:    Glucose-Capillary 518 (*)     All other components within normal limits  CBC WITH DIFFERENTIAL - Abnormal; Notable for the following:    RBC 5.16 (*)     MCV 75.4 (*)     All other components within normal limits  BASIC METABOLIC PANEL - Abnormal; Notable for the following:    Sodium 133 (*)     Chloride 93 (*)     Glucose, Bld 531 (*)     GFR calc non Af Amer 65 (*)     GFR calc Af Amer 76 (*)     All other components  within normal limits  URINALYSIS, ROUTINE W REFLEX MICROSCOPIC - Abnormal; Notable for the following:    Glucose, UA >1000 (*)     Hgb urine dipstick SMALL (*)     Ketones, ur 15 (*)     All other components within normal limits  URINE MICROSCOPIC-ADD ON - Abnormal; Notable for the following:    Squamous Epithelial / LPF FEW (*)     All other components within normal limits  GLUCOSE, CAPILLARY - Abnormal; Notable for the following:    Glucose-Capillary 469 (*)     All other  components within normal limits  GLUCOSE, CAPILLARY - Abnormal; Notable for the following:    Glucose-Capillary 407 (*)     All other components within normal limits  RAPID STREP SCREEN   Dg Chest 2 View  12/30/2011  *RADIOLOGY REPORT*  Clinical Data: Hyperglycemia, chest pain, coughing, congestion  CHEST - 2 VIEW  Comparison: 08/13/2011; 03/11/2011  Findings: Grossly unchanged cardiac silhouette and mediastinal contours.  No focal airspace opacities.  No pleural effusion or pneumothorax.  Unchanged bones.  Post cholecystectomy.  IMPRESSION: No acute cardiopulmonary disease.  Original Report Authenticated By: Rachel Moulds, M.D.     1. Hyperglycemia   2. Upper respiratory tract infection     4:05 PM Patient seen and examined. Work-up initiated. Medications ordered. Fluids ordered. Pt discussed with Dr. Winfred Leeds.   Vital signs reviewed and are as follows: Filed Vitals:   12/30/11 1530  BP: 96/61  Pulse: 100  Temp: 98.8 F (37.1 C)  Resp: 18   5:12 PM Anion gap = 14, bicarb normal.   7:57 PM Dr. Winfred Leeds has seen. Awaiting normalization of blood sugars. Handoff to [place] PA-C.     MDM  Elevated blood sugars without acidosis in setting of URI. Patient to be discharged after improvement in ED.         Carlisle Cater, [place] 12/30/11 2016

## 2011-12-30 NOTE — ED Notes (Signed)
Pt waiting for ride.

## 2011-12-30 NOTE — ED Provider Notes (Signed)
Patient with nasal congestion and cough onset 2 days ago. Admits to skipping her morning dose of insulin today. Patient alert nontoxic not acutely ill-appearing  [name], MD 12/30/11 1805

## 2012-02-13 ENCOUNTER — Other Ambulatory Visit: Payer: Self-pay | Admitting: Gastroenterology

## 2012-04-20 ENCOUNTER — Emergency Department (HOSPITAL_COMMUNITY)
Admission: EM | Admit: 2012-04-20 | Discharge: 2012-04-21 | Disposition: A | Discharge status: Home / Self Care | Payer: PRIVATE HEALTH INSURANCE | Attending: Emergency Medicine | Admitting: Emergency Medicine

## 2012-04-20 ENCOUNTER — Encounter (HOSPITAL_COMMUNITY): Payer: Self-pay | Admitting: Emergency Medicine

## 2012-04-20 DIAGNOSIS — R739 Hyperglycemia, unspecified: Secondary | ICD-10-CM

## 2012-04-20 DIAGNOSIS — E785 Hyperlipidemia, unspecified: Secondary | ICD-10-CM | POA: Insufficient documentation

## 2012-04-20 DIAGNOSIS — E119 Type 2 diabetes mellitus without complications: Secondary | ICD-10-CM | POA: Insufficient documentation

## 2012-04-20 DIAGNOSIS — E079 Disorder of thyroid, unspecified: Secondary | ICD-10-CM | POA: Insufficient documentation

## 2012-04-20 DIAGNOSIS — I10 Essential (primary) hypertension: Secondary | ICD-10-CM | POA: Insufficient documentation

## 2012-04-20 DIAGNOSIS — R7309 Other abnormal glucose: Secondary | ICD-10-CM | POA: Insufficient documentation

## 2012-04-20 DIAGNOSIS — H543 Unqualified visual loss, both eyes: Secondary | ICD-10-CM | POA: Insufficient documentation

## 2012-04-20 DIAGNOSIS — Z794 Long term (current) use of insulin: Secondary | ICD-10-CM | POA: Insufficient documentation

## 2012-04-20 DIAGNOSIS — Z791 Long term (current) use of non-steroidal anti-inflammatories (NSAID): Secondary | ICD-10-CM | POA: Insufficient documentation

## 2012-04-20 DIAGNOSIS — Z79899 Other long term (current) drug therapy: Secondary | ICD-10-CM | POA: Insufficient documentation

## 2012-04-20 DIAGNOSIS — M542 Cervicalgia: Secondary | ICD-10-CM | POA: Insufficient documentation

## 2012-04-20 DIAGNOSIS — M79609 Pain in unspecified limb: Secondary | ICD-10-CM | POA: Insufficient documentation

## 2012-04-20 DIAGNOSIS — Z87891 Personal history of nicotine dependence: Secondary | ICD-10-CM | POA: Insufficient documentation

## 2012-04-20 DIAGNOSIS — J45909 Unspecified asthma, uncomplicated: Secondary | ICD-10-CM | POA: Insufficient documentation

## 2012-04-20 LAB — GLUCOSE, CAPILLARY

## 2012-04-20 NOTE — ED Notes (Signed)
Notified RN, [facility] [id number].

## 2012-04-20 NOTE — ED Notes (Signed)
Pt alert, arrives from home, c/o neck pain, leg pain, denies trauma or injury, ambulates to triage, states hx neck pain, resp even unlabored, skin pwd

## 2012-04-21 LAB — COMPREHENSIVE METABOLIC PANEL
AST: 12 U/L (ref 0–37)
Albumin: 3 g/dL — ABNORMAL LOW (ref 3.5–5.2)
Alkaline Phosphatase: 131 U/L — ABNORMAL HIGH (ref 39–117)
BUN: 21 mg/dL (ref 6–23)
CO2: 29 mEq/L (ref 19–32)
Chloride: 97 mEq/L (ref 96–112)
Creatinine, Ser: 1.15 mg/dL — ABNORMAL HIGH (ref 0.50–1.10)
GFR calc non Af Amer: 53 mL/min — ABNORMAL LOW (ref >90)
Potassium: 4 mEq/L (ref 3.5–5.1)
Total Bilirubin: 0.2 mg/dL — ABNORMAL LOW (ref 0.3–1.2)

## 2012-04-21 LAB — URINALYSIS, ROUTINE W REFLEX MICROSCOPIC
Glucose, UA: >1000 mg/dL — AB
Hgb urine dipstick: NEGATIVE
Ketones, ur: NEGATIVE mg/dL
Protein, ur: NEGATIVE mg/dL
Urobilinogen, UA: 0.2 mg/dL (ref 0.0–1.0)

## 2012-04-21 LAB — GLUCOSE, CAPILLARY: Glucose-Capillary: 360 mg/dL — ABNORMAL HIGH (ref 70–99)

## 2012-04-21 LAB — CBC
HCT: 36.7 % (ref 36.0–46.0)
MCV: 76.6 fL — ABNORMAL LOW (ref 78.0–100.0)
RBC: 4.79 MIL/uL (ref 3.87–5.11)
RDW: 14.6 % (ref 11.5–15.5)
WBC: 9.8 10*3/uL (ref 4.0–10.5)

## 2012-04-21 MED ORDER — INSULIN ASPART 100 UNIT/ML ~~LOC~~ SOLN
10.0000 [IU] | Freq: Once | SUBCUTANEOUS | Status: AC
  Administered 2012-04-21: 10 [IU] via SUBCUTANEOUS
  Filled 2012-04-21: qty 1

## 2012-04-21 MED ORDER — INSULIN REGULAR HUMAN 100 UNIT/ML IJ SOLN: 10.0000 [IU] | Freq: Once | INTRAMUSCULAR | Status: DC

## 2012-04-21 MED ORDER — NAPROXEN 500 MG PO TABS: 500.0000 mg | ORAL_TABLET | Freq: Two times a day (BID) | ORAL | Status: DC

## 2012-04-21 MED ORDER — KETOROLAC TROMETHAMINE 60 MG/2ML IM SOLN
60.0000 mg | Freq: Once | INTRAMUSCULAR | Status: AC
  Administered 2012-04-21: 60 mg via INTRAMUSCULAR
  Filled 2012-04-21: qty 2

## 2012-04-21 MED ORDER — SODIUM CHLORIDE 0.9 % IV BOLUS (SEPSIS)
1000.0000 mL | Freq: Once | INTRAVENOUS | Status: AC
  Administered 2012-04-21: 1000 mL via INTRAVENOUS

## 2012-04-21 NOTE — ED Notes (Signed)
Pt from home with c/o of neck and back pain, sts hx of spinal stenosis. Sts she is feeling some of the pain in her left arm as well.

## 2012-04-21 NOTE — ED Notes (Signed)
Patient is alert and oriented x3.  She was given DC instructions and follow up visit instructions.  Patient gave verbal understanding. She was DC via wheelchair with family  to home.  V/S stable.  He was not showing any signs of distress on DC

## 2012-04-21 NOTE — ED Notes (Signed)
Patient is alert and oriented x3.  She was given DC instructions and follow up visit instructions.  Patient gave verbal understanding. She was DC ambulatory under her own power to home.  V/S stable.  He was not showing any signs of distress on DC 

## 2012-04-21 NOTE — ED Provider Notes (Signed)
History     CSN: [id number]  Arrival date & time 04/20/12  2202   First MD Initiated Contact with Patient 04/21/12 0017      Chief Complaint  Patient presents with  . Leg Pain  . Neck Pain     (Consider location/radiation/quality/duration/timing/severity/associated sxs/prior treatment) HPI Comments: 51 year old female with history of diabetes and hypertension with chronic back pain and neck pain. She has seen Dr. Rolena Infante with orthopedics in the past who has performed cortisone shots secondary to her ongoing back and neck pain.  She is hesitant to use narcotic medications because of the side effects of the sticks to taking Aleve. She notes that she has had pain daily for years, recently has become worse and is located in the cervical region as well as the paraspinal regions and the rhomboids and trapezius muscles. She has had no numbness weakness or tingling of the upper or lower extremities. She has pain in her right hip and right knee which is chronic as well. She admits that since things giving her blood sugars have been elevated, she denies missing any medications and states that she has been adhering to a diabetic diet other than eating potatoes.  She denies fevers, chills, nausea, vomiting, chest pain, cough, shortness of breath, abdominal pain, dysuria or diarrhea. She also denies swelling numbness weakness or tingling.  Patient is a 51 y.o. female presenting with leg pain. The history is provided by the patient.  Leg Pain     Past Medical History  Diagnosis Date  . Asthma   . Diabetes mellitus   . Hypertension   . Blind   . Hypothyroidism   . Hyperlipidemia   . Diabetic neuropathy     Past Surgical History  Procedure Date  . Cesarean section     x 2  . Abdominal hysterectomy   . Cholecystectomy   . Enucleation     Family History  Problem Relation Age of Onset  . Diabetes Sister     History  Substance Use Topics  . Smoking status: Former Smoker    Quit date:  05/22/1992  . Smokeless tobacco: Never Used  . Alcohol Use: No    OB History    Grav Para Term Preterm Abortions TAB SAB Ect Mult Living                  Review of Systems  All other systems reviewed and are negative.    Allergies  Codeine; Iohexol; Penicillins; and Sulfa antibiotics  Home Medications   Current Outpatient Rx  Name  Route  Sig  Dispense  Refill  . ALBUTEROL SULFATE HFA 108 (90 BASE) MCG/ACT IN AERS   Inhalation   Inhale 2 puffs into the lungs every 4 (four) hours as needed. For shortness of breath         . INSULIN ASPART 100 UNIT/ML [place] SOLN   Subcutaneous   Inject 20 Units into the skin 3 (three) times daily as needed. Blood sugar         . INSULIN GLARGINE 100 UNIT/ML [place] SOLN   Subcutaneous   Inject 30 Units into the skin at bedtime.          Marland Kitchen LEVOTHYROXINE SODIUM 137 MCG PO TABS   Oral   Take 137 mcg by mouth daily.         Marland Kitchen MONTELUKAST SODIUM 10 MG PO TABS   Oral   Take 10 mg by mouth at bedtime.           Marland Kitchen  PANTOPRAZOLE SODIUM 40 MG PO TBEC   Oral   Take 40 mg by mouth daily.          Marland Kitchen POTASSIUM CHLORIDE CRYS ER 20 MEQ PO TBCR   Oral   Take 20 mEq by mouth daily.          Marland Kitchen PREGABALIN 50 MG PO CAPS   Oral   Take 50 mg by mouth 2 (two) times daily.           Marland Kitchen RAMIPRIL 5 MG PO CAPS   Oral   Take 5 mg by mouth daily.           Marland Kitchen SIMVASTATIN 20 MG PO TABS   Oral   Take 20 mg by mouth every evening.           Marland Kitchen VITAMIN B-12 1000 MCG PO TABS   Oral   Take 1,000 mcg by mouth daily.           Marland Kitchen NAPROXEN 500 MG PO TABS   Oral   Take 1 tablet (500 mg total) by mouth 2 (two) times daily with a meal.   30 tablet   0     BP 128/77  Pulse 64  Temp 98 F (36.7 C)  Resp 16  SpO2 100%  Physical Exam  Nursing note and vitals reviewed. Constitutional: She appears well-developed and well-nourished. No distress.  HENT:  Head: Normocephalic and atraumatic.  Mouth/Throat: Oropharynx is clear and moist. No  oropharyngeal exudate.       Mucous membranes are moist, no pharyngeal erythema exudate asymmetry or hypertrophy  Eyes: Right eye exhibits no discharge. Left eye exhibits no discharge.       Left eye corneal haziness, blindness, right eye wanders  Neck: Normal range of motion. Neck supple. No JVD present. No thyromegaly present.       Supple neck, no lymphadenopathy  Cardiovascular: Normal rate, regular rhythm, normal heart sounds and intact distal pulses.  Exam reveals no gallop and no friction rub.   No murmur heard. Pulmonary/Chest: Effort normal and breath sounds normal. No respiratory distress. She has no wheezes. She has no rales.  Abdominal: Soft. Bowel sounds are normal. She exhibits no distension and no mass. There is no tenderness.  Musculoskeletal: Normal range of motion. She exhibits tenderness ( Mild to moderate tenderness to palpation over the cervical C6-C7 area, paraspinal muscles, trapezius muscles, rhomboids. Patient states reproduces the exact pain). She exhibits no edema.  Lymphadenopathy:    She has no cervical adenopathy.  Neurological: She is alert. Coordination normal.       No numbness weakness or tingling  Skin: Skin is warm and dry. No rash noted. No erythema.  Psychiatric: She has a normal mood and affect. Her behavior is normal.    ED Course  Procedures (including critical care time)  Labs Reviewed  GLUCOSE, CAPILLARY - Abnormal; Notable for the following:    Glucose-Capillary 435 (*)     All other components within normal limits  CBC - Abnormal; Notable for the following:    MCV 76.6 (*)     MCH 25.9 (*)     All other components within normal limits  COMPREHENSIVE METABOLIC PANEL - Abnormal; Notable for the following:    Glucose, Bld 444 (*)     Creatinine, Ser 1.15 (*)     Albumin 3.0 (*)     Alkaline Phosphatase 131 (*)     Total Bilirubin 0.2 (*)     GFR calc  non Af Amer 53 (*)     GFR calc Af Amer 62 (*)     All other components within normal  limits  URINALYSIS, ROUTINE W REFLEX MICROSCOPIC - Abnormal; Notable for the following:    Glucose, UA >1000 (*)     All other components within normal limits  GLUCOSE, CAPILLARY - Abnormal; Notable for the following:    Glucose-Capillary 360 (*)     All other components within normal limits  URINE MICROSCOPIC-ADD ON   No results found.   1. Hyperglycemia   2. Neck pain       MDM  Patient appears stable, has no focal neurologic deficits, vital signs are normal, blood sugars elevated, fluids, insulin, pain medications, recheck. Patient states this is just her chronic pain gradually worsening.  Fluids given, blood sugar has improved significantly, the patient is tolerating food without any nausea, pain is improved after Toradol, stable for discharge to followup with orthopedics.      Johnna Acosta, MD 04/21/12 [phone]

## 2012-06-19 ENCOUNTER — Emergency Department (HOSPITAL_COMMUNITY)
Admission: EM | Admit: 2012-06-19 | Discharge: 2012-06-20 | Disposition: A | Discharge status: Home / Self Care | Payer: PRIVATE HEALTH INSURANCE | Attending: Emergency Medicine | Admitting: Emergency Medicine

## 2012-06-19 ENCOUNTER — Encounter (HOSPITAL_COMMUNITY): Payer: Self-pay | Admitting: *Deleted

## 2012-06-19 DIAGNOSIS — R5383 Other fatigue: Secondary | ICD-10-CM | POA: Insufficient documentation

## 2012-06-19 DIAGNOSIS — E875 Hyperkalemia: Secondary | ICD-10-CM | POA: Insufficient documentation

## 2012-06-19 DIAGNOSIS — R739 Hyperglycemia, unspecified: Secondary | ICD-10-CM

## 2012-06-19 DIAGNOSIS — Z794 Long term (current) use of insulin: Secondary | ICD-10-CM | POA: Insufficient documentation

## 2012-06-19 DIAGNOSIS — E1142 Type 2 diabetes mellitus with diabetic polyneuropathy: Secondary | ICD-10-CM | POA: Insufficient documentation

## 2012-06-19 DIAGNOSIS — H543 Unqualified visual loss, both eyes: Secondary | ICD-10-CM | POA: Insufficient documentation

## 2012-06-19 DIAGNOSIS — Z79899 Other long term (current) drug therapy: Secondary | ICD-10-CM | POA: Insufficient documentation

## 2012-06-19 DIAGNOSIS — I10 Essential (primary) hypertension: Secondary | ICD-10-CM | POA: Insufficient documentation

## 2012-06-19 DIAGNOSIS — R197 Diarrhea, unspecified: Secondary | ICD-10-CM | POA: Insufficient documentation

## 2012-06-19 DIAGNOSIS — Z87891 Personal history of nicotine dependence: Secondary | ICD-10-CM | POA: Insufficient documentation

## 2012-06-19 DIAGNOSIS — E1149 Type 2 diabetes mellitus with other diabetic neurological complication: Secondary | ICD-10-CM | POA: Insufficient documentation

## 2012-06-19 DIAGNOSIS — R110 Nausea: Secondary | ICD-10-CM | POA: Insufficient documentation

## 2012-06-19 DIAGNOSIS — E785 Hyperlipidemia, unspecified: Secondary | ICD-10-CM | POA: Insufficient documentation

## 2012-06-19 DIAGNOSIS — J45909 Unspecified asthma, uncomplicated: Secondary | ICD-10-CM | POA: Insufficient documentation

## 2012-06-19 DIAGNOSIS — E039 Hypothyroidism, unspecified: Secondary | ICD-10-CM | POA: Insufficient documentation

## 2012-06-19 DIAGNOSIS — R509 Fever, unspecified: Secondary | ICD-10-CM | POA: Insufficient documentation

## 2012-06-19 DIAGNOSIS — M542 Cervicalgia: Secondary | ICD-10-CM | POA: Insufficient documentation

## 2012-06-19 DIAGNOSIS — Z9001 Acquired absence of eye: Secondary | ICD-10-CM | POA: Insufficient documentation

## 2012-06-19 DIAGNOSIS — R5381 Other malaise: Secondary | ICD-10-CM | POA: Insufficient documentation

## 2012-06-19 DIAGNOSIS — G8929 Other chronic pain: Secondary | ICD-10-CM | POA: Insufficient documentation

## 2012-06-19 HISTORY — DX: Blindness, one eye, low vision other eye, unspecified eyes: H54.10

## 2012-06-19 LAB — BASIC METABOLIC PANEL
CO2: 29 mEq/L (ref 19–32)
Chloride: 98 mEq/L (ref 96–112)
GFR calc non Af Amer: 68 mL/min — ABNORMAL LOW (ref >90)
Glucose, Bld: 551 mg/dL (ref 70–99)
Potassium: 5.2 mEq/L — ABNORMAL HIGH (ref 3.5–5.1)
Sodium: 137 mEq/L (ref 135–145)

## 2012-06-19 LAB — GLUCOSE, CAPILLARY: Glucose-Capillary: 451 mg/dL — ABNORMAL HIGH (ref 70–99)

## 2012-06-19 LAB — URINALYSIS, ROUTINE W REFLEX MICROSCOPIC
Glucose, UA: >1000 mg/dL — AB
Specific Gravity, Urine: 1.02 (ref 1.005–1.030)
Urobilinogen, UA: 0.2 mg/dL (ref 0.0–1.0)
pH: 7 (ref 5.0–8.0)

## 2012-06-19 LAB — CBC WITH DIFFERENTIAL/PLATELET
Hemoglobin: 13.1 g/dL (ref 12.0–15.0)
Lymphocytes Relative: 19 % (ref 12–46)
Lymphs Abs: 2 10*3/uL (ref 0.7–4.0)
Monocytes Relative: 8 % (ref 3–12)
Neutro Abs: 7.6 10*3/uL (ref 1.7–7.7)
Neutrophils Relative %: 71 % (ref 43–77)
Platelets: 191 10*3/uL (ref 150–400)
RBC: 5.03 MIL/uL (ref 3.87–5.11)
WBC: 10.6 10*3/uL — ABNORMAL HIGH (ref 4.0–10.5)

## 2012-06-19 LAB — URINE MICROSCOPIC-ADD ON

## 2012-06-19 MED ORDER — SODIUM CHLORIDE 0.9 % IV BOLUS (SEPSIS)
1000.0000 mL | Freq: Once | INTRAVENOUS | Status: AC
  Administered 2012-06-19: 1000 mL via INTRAVENOUS

## 2012-06-19 NOTE — ED Provider Notes (Signed)
History     CSN: [id number]  Arrival date & time 06/19/12  2034   First MD Initiated Contact with Patient 06/19/12 2050      Chief Complaint  Patient presents with  . Hyperglycemia   HPI  History provided by the patient. Patient is a 52 year old female with history of hypertension, hyperlipidemia, diabetes, diabetic neuropathy and blindness who presents with concerns for elevated blood sugar, increased fatigue and recent diarrhea symptoms. Patient lives at home alone. She reports having some recent diarrhea symptoms for the past one to 2 days. She has had several soft and sometimes watery loose stools associated with abdominal bloating. Today she had increased weakness, fatigue and nausea. She reports having cortisone shots to her cervical spine for her chronic neck pains yesterday. She was told by her doctor that this may increase her risk for elevated blood sugars. All day today her sugars have been high prior to her meals. They were in the 300s range until this evening when her meter would not register accurate number. She was using some increased doses of insulin to try to correct her elevated sugar without any improvement. She denies any episodes of vomiting. Does reports some subjective fevers and chills. Denies any known sick contacts. Does reports some improvement of her chronic neck pains from yesterday. Denies any stiffness. Denies any headache. Denies any confusion.    Past Medical History  Diagnosis Date  . Asthma   . Diabetes mellitus   . Hypertension   . Blind   . Hypothyroidism   . Hyperlipidemia   . Diabetic neuropathy   . Blindness and low vision     left eye glass eye,  legally blind in right eye    Past Surgical History  Procedure Date  . Cesarean section     x 2  . Abdominal hysterectomy   . Cholecystectomy   . Enucleation     Family History  Problem Relation Age of Onset  . Diabetes Sister     History  Substance Use Topics  . Smoking status: Former  Smoker    Quit date: 05/22/1992  . Smokeless tobacco: Never Used  . Alcohol Use: No    OB History    Grav Para Term Preterm Abortions TAB SAB Ect Mult Living                  Review of Systems  Constitutional: Positive for fever, chills and fatigue.  Respiratory: Negative for shortness of breath.   Cardiovascular: Negative for chest pain.  Gastrointestinal: Positive for diarrhea and abdominal distention. Negative for vomiting.  All other systems reviewed and are negative.    Allergies  Codeine; Iohexol; Penicillins; and Sulfa antibiotics  Home Medications   Current Outpatient Rx  Name  Route  Sig  Dispense  Refill  . ALBUTEROL SULFATE HFA 108 (90 BASE) MCG/ACT IN AERS   Inhalation   Inhale 2 puffs into the lungs every 4 (four) hours as needed. For shortness of breath         . INSULIN ASPART 100 UNIT/ML Standard SOLN   Subcutaneous   Inject 20 Units into the skin 3 (three) times daily as needed. Blood sugar         . INSULIN GLARGINE 100 UNIT/ML [place] SOLN   Subcutaneous   Inject 30 Units into the skin at bedtime.          Marland Kitchen LEVOTHYROXINE SODIUM 137 MCG PO TABS   Oral   Take 137 mcg by  mouth every morning.          Marland Kitchen MONTELUKAST SODIUM 10 MG PO TABS   Oral   Take 10 mg by mouth at bedtime.           . OXYGEN-HELIUM IN   Inhalation   Inhale 2 L into the lungs every evening.         Marland Kitchen PANTOPRAZOLE SODIUM 40 MG PO TBEC   Oral   Take 40 mg by mouth every morning.          Marland Kitchen POTASSIUM CHLORIDE CRYS ER 20 MEQ PO TBCR   Oral   Take 20 mEq by mouth every morning.          Marland Kitchen PREGABALIN 50 MG PO CAPS   Oral   Take 50 mg by mouth 2 (two) times daily.          Marland Kitchen RAMIPRIL 5 MG PO CAPS   Oral   Take 5 mg by mouth every other day.          Marland Kitchen SIMVASTATIN 20 MG PO TABS   Oral   Take 20 mg by mouth every evening.           Marland Kitchen VITAMIN B-12 1000 MCG PO TABS   Oral   Take 1,000 mcg by mouth every morning.            BP 121/72  Pulse 84  Temp  98.5 F (36.9 C) (Oral)  Resp 11  SpO2 95%  Physical Exam  Nursing note and vitals reviewed. Constitutional: She is oriented to person, place, and time. She appears well-developed and well-nourished. No distress.  HENT:  Head: Normocephalic and atraumatic.  Eyes: Conjunctivae normal are normal. Right eye exhibits no discharge.       False left eye.  Neck: Normal range of motion. Neck supple.  Cardiovascular: Normal rate and regular rhythm.   Pulmonary/Chest: Effort normal and breath sounds normal. No respiratory distress. She has no wheezes. She has no rales.  Abdominal: Soft. She exhibits distension. There is tenderness. There is no rebound and no guarding.       Very mild diffuse tenderness. Abdomen soft without peritoneal signs.  Musculoskeletal: Normal range of motion. She exhibits no edema.       Dry skin of the feet. No sores or ulcers. Normal movement in all extremities  Neurological: She is alert and oriented to person, place, and time.  Skin: Skin is warm and dry. No rash noted.  Psychiatric: She has a normal mood and affect. Her behavior is normal.    ED Course  Procedures   Results for orders placed during the hospital encounter of 06/19/12  CBC WITH DIFFERENTIAL      Component Value Range   WBC 10.6 (*) 4.0 - 10.5 K/uL   RBC 5.03  3.87 - 5.11 MIL/uL   Hemoglobin 13.1  12.0 - 15.0 g/dL   HCT 38.9  36.0 - 46.0 %   MCV 77.3 (*) 78.0 - 100.0 fL   MCH 26.0  26.0 - 34.0 pg   MCHC 33.7  30.0 - 36.0 g/dL   RDW 14.2  11.5 - 15.5 %   Platelets 191  150 - 400 K/uL   Neutrophils Relative 71  43 - 77 %   Neutro Abs 7.6  1.7 - 7.7 K/uL   Lymphocytes Relative 19  12 - 46 %   Lymphs Abs 2.0  0.7 - 4.0 K/uL   Monocytes Relative 8  3 -  12 %   Monocytes Absolute 0.9  0.1 - 1.0 K/uL   Eosinophils Relative 1  0 - 5 %   Eosinophils Absolute 0.1  0.0 - 0.7 K/uL   Basophils Relative 0  0 - 1 %   Basophils Absolute 0.0  0.0 - 0.1 K/uL  BASIC METABOLIC PANEL      Component Value  Range   Sodium 137  135 - 145 mEq/L   Potassium 5.2 (*) 3.5 - 5.1 mEq/L   Chloride 98  96 - 112 mEq/L   CO2 29  19 - 32 mEq/L   Glucose, Bld 551 (*) 70 - 99 mg/dL   BUN 22  6 - 23 mg/dL   Creatinine, Ser 0.94  0.50 - 1.10 mg/dL   Calcium 9.9  8.4 - 10.5 mg/dL   GFR calc non Af Amer 68 (*) >90 mL/min   GFR calc Af Amer 79 (*) >90 mL/min  URINALYSIS, ROUTINE W REFLEX MICROSCOPIC      Component Value Range   Color, Urine YELLOW  YELLOW   APPearance CLEAR  CLEAR   Specific Gravity, Urine 1.020  1.005 - 1.030   pH 7.0  5.0 - 8.0   Glucose, UA >1000 (*) NEGATIVE mg/dL   Hgb urine dipstick TRACE (*) NEGATIVE   Bilirubin Urine NEGATIVE  NEGATIVE   Ketones, ur TRACE (*) NEGATIVE mg/dL   Protein, ur NEGATIVE  NEGATIVE mg/dL   Urobilinogen, UA 0.2  0.0 - 1.0 mg/dL   Nitrite NEGATIVE  NEGATIVE   Leukocytes, UA NEGATIVE  NEGATIVE  URINE MICROSCOPIC-ADD ON      Component Value Range   Squamous Epithelial / LPF RARE  RARE   WBC, UA 0-2  <3 WBC/hpf   RBC / HPF 0-2  <3 RBC/hpf   Bacteria, UA RARE  RARE  GLUCOSE, CAPILLARY      Component Value Range   Glucose-Capillary 451 (*) 70 - 99 mg/dL   Comment 1 Documented in Chart     Comment 2 Notify RN    GLUCOSE, CAPILLARY      Component Value Range   Glucose-Capillary 364 (*) 70 - 99 mg/dL   Comment 1 Documented in Chart     Comment 2 Notify RN    GLUCOSE, CAPILLARY      Component Value Range   Glucose-Capillary 279 (*) 70 - 99 mg/dL   Comment 1 Documented in Chart     Comment 2 Notify RN    POTASSIUM      Component Value Range   Potassium 3.7  3.5 - 5.1 mEq/L  GLUCOSE, CAPILLARY      Component Value Range   Glucose-Capillary 171 (*) 70 - 99 mg/dL   Comment 1 Documented in Chart     Comment 2 Notify RN    GLUCOSE, CAPILLARY      Component Value Range   Glucose-Capillary 177 (*) 70 - 99 mg/dL   Comment 1 Documented in Chart     Comment 2 Notify RN          1. Hyperglycemia       MDM  Patient seen and evaluated. Patient  resting appears well in no acute distress.  Patient continues to be comfortable in doing well. Blood sugar hasn't been improving steadily. Close monitoring. Slight hyperkalemia. No ECG changes. Will obtain recheck potassium.  Patient continues to do well through the night. A recheck of her potassium shows a normal potassium at this time. Her sugars have continued to improve and  at the level of around 170. Patient does complain of some low back pains from lying in bed. We'll give dose of pain medicine. This time she feels ready to return home and will follow up with PCP.   Date: 06/19/2012  Rate: 83  Rhythm: normal sinus rhythm  QRS Axis: normal  Intervals: normal  ST/T Wave abnormalities: normal  Conduction Disutrbances:none  Narrative Interpretation:   Old EKG Reviewed: unchanged    Martie Lee, PA 06/20/12 2005

## 2012-06-19 NOTE — ED Notes (Signed)
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

## 2012-06-19 NOTE — ED Notes (Signed)
Pt states she received a cortisone shot yesterday,  And was told it might effect her blood glucose and it has,  Glucose 585 per EMS on scene

## 2012-06-19 NOTE — ED Notes (Signed)
[id number] Expected date:<BR> Expected time:<BR> Means of arrival:<BR> Comments:<BR> Rm 12, Medic 261, Hyperglycemia

## 2012-06-20 LAB — GLUCOSE, CAPILLARY
Glucose-Capillary: 364 mg/dL — ABNORMAL HIGH (ref 70–99)
Glucose-Capillary: 177 mg/dL — ABNORMAL HIGH (ref 70–99)

## 2012-06-20 MED ORDER — INSULIN ASPART 100 UNIT/ML ~~LOC~~ SOLN
10.0000 [IU] | Freq: Once | SUBCUTANEOUS | Status: AC
  Administered 2012-06-20: 10 [IU] via SUBCUTANEOUS
  Filled 2012-06-20: qty 1

## 2012-06-20 MED ORDER — SODIUM CHLORIDE 0.9 % IV BOLUS (SEPSIS)
1000.0000 mL | Freq: Once | INTRAVENOUS | Status: AC
  Administered 2012-06-20: 1000 mL via INTRAVENOUS

## 2012-06-20 MED ORDER — KETOROLAC TROMETHAMINE 30 MG/ML IJ SOLN
30.0000 mg | Freq: Once | INTRAMUSCULAR | Status: AC
  Administered 2012-06-20: 30 mg via INTRAVENOUS
  Filled 2012-06-20: qty 1

## 2012-06-20 MED ORDER — INSULIN REGULAR HUMAN 100 UNIT/ML IJ SOLN: 10.0000 [IU] | Freq: Once | INTRAMUSCULAR | Status: DC

## 2012-06-20 MED ORDER — HYDROCODONE-ACETAMINOPHEN 5-325 MG PO TABS: 2.0000 | ORAL_TABLET | Freq: Once | ORAL | Status: DC

## 2012-06-20 NOTE — ED Notes (Signed)
[facility] contacted 

## 2012-06-20 NOTE — Discharge Instructions (Signed)
You were seen and evaluated for your elevated blood sugar. This was treated with IV fluids and a small dose of insulin. Your blood sugars have continued to improve throughout the night in the emergency room. At this time your providers feel you may return home and continue your normal medications and to followup with your primary care provider.   Hyperglycemia Hyperglycemia occurs when the glucose (sugar) in your blood is too high. Hyperglycemia can happen for many reasons, but it most often happens to people who do not know they have diabetes or are not managing their diabetes properly.  CAUSES  Whether you have diabetes or not, there are other causes of hyperglycemia. Hyperglycemia can occur when you have diabetes, but it can also occur in other situations that you might not be as aware of, such as: Diabetes  If you have diabetes and are having problems controlling your blood glucose, hyperglycemia could occur because of some of the following reasons:  Not following your meal plan.  Not taking your diabetes medications or not taking it properly.  Exercising less or doing less activity than you normally do.  Being sick. Pre-diabetes  This cannot be ignored. Before people develop Type 2 diabetes, they almost always have "pre-diabetes." This is when your blood glucose levels are higher than normal, but not yet high enough to be diagnosed as diabetes. Research has shown that some long-term damage to the body, especially the heart and circulatory system, may already be occurring during pre-diabetes. If you take action to manage your blood glucose when you have pre-diabetes, you may delay or prevent Type 2 diabetes from developing. Stress  If you have diabetes, you may be "diet" controlled or on oral medications or insulin to control your diabetes. However, you may find that your blood glucose is higher than usual in the hospital whether you have diabetes or not. This is often referred to as  "stress hyperglycemia." Stress can elevate your blood glucose. This happens because of hormones put out by the body during times of stress. If stress has been the cause of your high blood glucose, it can be followed regularly by your caregiver. That way he/she can make sure your hyperglycemia does not continue to get worse or progress to diabetes. Steroids  Steroids are medications that act on the infection fighting system (immune system) to block inflammation or infection. One side effect can be a rise in blood glucose. Most people can produce enough extra insulin to allow for this rise, but for those who cannot, steroids make blood glucose levels go even higher. It is not unusual for steroid treatments to "uncover" diabetes that is developing. It is not always possible to determine if the hyperglycemia will go away after the steroids are stopped. A special blood test called an A1c is sometimes done to determine if your blood glucose was elevated before the steroids were started. SYMPTOMS  Thirsty.  Frequent urination.  Dry mouth.  Blurred vision.  Tired or fatigue.  Weakness.  Sleepy.  Tingling in feet or leg. DIAGNOSIS  Diagnosis is made by monitoring blood glucose in one or all of the following ways:  A1c test. This is a chemical found in your blood.  Fingerstick blood glucose monitoring.  Laboratory results. TREATMENT  First, knowing the cause of the hyperglycemia is important before the hyperglycemia can be treated. Treatment may include, but is not be limited to:  Education.  Change or adjustment in medications.  Change or adjustment in meal plan.  Treatment  for an illness, infection, etc.  More frequent blood glucose monitoring.  Change in exercise plan.  Decreasing or stopping steroids.  Lifestyle changes. HOME CARE INSTRUCTIONS   Test your blood glucose as directed.  Exercise regularly. Your caregiver will give you instructions about exercise. Pre-diabetes  or diabetes which comes on with stress is helped by exercising.  Eat wholesome, balanced meals. Eat often and at regular, fixed times. Your caregiver or nutritionist will give you a meal plan to guide your sugar intake.  Being at an ideal weight is important. If needed, losing as little as 10 to 15 pounds may help improve blood glucose levels. SEEK MEDICAL CARE IF:   You have questions about medicine, activity, or diet.  You continue to have symptoms (problems such as increased thirst, urination, or weight gain). SEEK IMMEDIATE MEDICAL CARE IF:   You are vomiting or have diarrhea.  Your breath smells fruity.  You are breathing faster or slower.  You are very sleepy or incoherent.  You have numbness, tingling, or pain in your feet or hands.  You have chest pain.  Your symptoms get worse even though you have been following your caregiver's orders.  If you have any other questions or concerns. Document Released: 10/26/2000 Document Revised: 07/25/2011 Document Reviewed: 08/29/2011 [facility] Patient Information 2013 No Name.

## 2012-06-20 NOTE — ED Notes (Signed)
Pt dressed by Charlynn Court and this writer, PT[facility] to pick up patient,

## 2012-06-22 NOTE — ED Provider Notes (Signed)
Medical screening examination/treatment/procedure(s) were performed by non-physician practitioner and as supervising physician I was immediately available for consultation/collaboration.   Julianne Rice, MD 06/22/12 [phone]

## 2012-08-02 ENCOUNTER — Encounter (HOSPITAL_COMMUNITY): Payer: Self-pay | Admitting: Emergency Medicine

## 2012-08-02 ENCOUNTER — Emergency Department (HOSPITAL_COMMUNITY)
Admission: EM | Admit: 2012-08-02 | Discharge: 2012-08-02 | Disposition: A | Discharge status: Home / Self Care | Payer: PRIVATE HEALTH INSURANCE | Attending: Emergency Medicine | Admitting: Emergency Medicine

## 2012-08-02 DIAGNOSIS — Z79899 Other long term (current) drug therapy: Secondary | ICD-10-CM | POA: Insufficient documentation

## 2012-08-02 DIAGNOSIS — G589 Mononeuropathy, unspecified: Secondary | ICD-10-CM | POA: Insufficient documentation

## 2012-08-02 DIAGNOSIS — I951 Orthostatic hypotension: Secondary | ICD-10-CM | POA: Insufficient documentation

## 2012-08-02 DIAGNOSIS — J45909 Unspecified asthma, uncomplicated: Secondary | ICD-10-CM | POA: Insufficient documentation

## 2012-08-02 DIAGNOSIS — E1149 Type 2 diabetes mellitus with other diabetic neurological complication: Secondary | ICD-10-CM | POA: Insufficient documentation

## 2012-08-02 DIAGNOSIS — E1169 Type 2 diabetes mellitus with other specified complication: Secondary | ICD-10-CM | POA: Insufficient documentation

## 2012-08-02 DIAGNOSIS — Z794 Long term (current) use of insulin: Secondary | ICD-10-CM | POA: Insufficient documentation

## 2012-08-02 DIAGNOSIS — I10 Essential (primary) hypertension: Secondary | ICD-10-CM | POA: Insufficient documentation

## 2012-08-02 DIAGNOSIS — E039 Hypothyroidism, unspecified: Secondary | ICD-10-CM | POA: Insufficient documentation

## 2012-08-02 DIAGNOSIS — E785 Hyperlipidemia, unspecified: Secondary | ICD-10-CM | POA: Insufficient documentation

## 2012-08-02 DIAGNOSIS — Z87891 Personal history of nicotine dependence: Secondary | ICD-10-CM | POA: Insufficient documentation

## 2012-08-02 LAB — CBC
Hemoglobin: 14.6 g/dL (ref 12.0–15.0)
MCH: 26.6 pg (ref 26.0–34.0)
RBC: 5.49 MIL/uL — ABNORMAL HIGH (ref 3.87–5.11)
WBC: 9.4 10*3/uL (ref 4.0–10.5)

## 2012-08-02 LAB — GLUCOSE, CAPILLARY: Glucose-Capillary: 143 mg/dL — ABNORMAL HIGH (ref 70–99)

## 2012-08-02 LAB — URINALYSIS, ROUTINE W REFLEX MICROSCOPIC
Glucose, UA: 500 mg/dL — AB
Ketones, ur: NEGATIVE mg/dL
Leukocytes, UA: NEGATIVE
Protein, ur: NEGATIVE mg/dL

## 2012-08-02 LAB — COMPREHENSIVE METABOLIC PANEL
AST: 13 U/L (ref 0–37)
CO2: 28 mEq/L (ref 19–32)
Calcium: 10.4 mg/dL (ref 8.4–10.5)
Creatinine, Ser: 1.43 mg/dL — ABNORMAL HIGH (ref 0.50–1.10)
GFR calc Af Amer: 47 mL/min — ABNORMAL LOW (ref >90)
GFR calc non Af Amer: 41 mL/min — ABNORMAL LOW (ref >90)

## 2012-08-02 MED ORDER — SODIUM CHLORIDE 0.9 % IV BOLUS (SEPSIS)
1000.0000 mL | Freq: Once | INTRAVENOUS | Status: AC
  Administered 2012-08-02: 1000 mL via INTRAVENOUS

## 2012-08-02 NOTE — ED Notes (Signed)
Pt states her blood sugar was over 500 yesterday pt has had polyuria. Pt been having low BP that started yesterday and c/o dizziness. Pt is blind.

## 2012-08-02 NOTE — ED Provider Notes (Signed)
History     CSN: [id number]  Arrival date & time 08/02/12  1109   First MD Initiated Contact with Patient 08/02/12 1236      Chief Complaint  Patient presents with  . Dizziness  . Hypotension  . Hyperglycemia    (Consider location/radiation/quality/duration/timing/severity/associated sxs/prior treatment) HPI Pt presenting with c/o lightheadedness upon standing.  Pt has had difficulty controlling her blood sugar, states it has been OK for the past several days.  This morning she felt lightheaded and was taking her BP at home and it was low.  Does take HTN med but has had no recent change in dosing.  No fever, no vomiting or diarrhea.  She states she has been eating and drinking normally.  No chest pain or sob.  There are no other associated systemic symptoms, there are no other alleviating or modifying factors.   Past Medical History  Diagnosis Date  . Asthma   . Diabetes mellitus   . Hypertension   . Blind   . Hypothyroidism   . Hyperlipidemia   . Diabetic neuropathy   . Blindness and low vision     left eye glass eye,  legally blind in right eye    Past Surgical History  Procedure Laterality Date  . Cesarean section      x 2  . Abdominal hysterectomy    . Cholecystectomy    . Enucleation      Family History  Problem Relation Age of Onset  . Diabetes Sister     History  Substance Use Topics  . Smoking status: Former Smoker    Quit date: 05/22/1992  . Smokeless tobacco: Never Used  . Alcohol Use: No    OB History   Grav Para Term Preterm Abortions TAB SAB Ect Mult Living                  Review of Systems ROS reviewed and all otherwise negative except for mentioned in HPI  Allergies  Codeine; Iohexol; Penicillins; and Sulfa antibiotics  Home Medications   Current Outpatient Rx  Name  Route  Sig  Dispense  Refill  . albuterol (PROVENTIL HFA;VENTOLIN HFA) 108 (90 BASE) MCG/ACT inhaler   Inhalation   Inhale 2 puffs into the lungs every 4 (four)  hours as needed. For shortness of breath         . insulin aspart (NOVOLOG) 100 UNIT/ML injection   Subcutaneous   Inject 20 Units into the skin 3 (three) times daily as needed. Blood sugar         . insulin glargine (LANTUS) 100 UNIT/ML injection   Subcutaneous   Inject 30 Units into the skin at bedtime.          Marland Kitchen levothyroxine (SYNTHROID, LEVOTHROID) 137 MCG tablet   Oral   Take 137 mcg by mouth every morning.          . montelukast (SINGULAIR) 10 MG tablet   Oral   Take 10 mg by mouth at bedtime.           . pantoprazole (PROTONIX) 40 MG tablet   Oral   Take 40 mg by mouth every morning.          . potassium chloride SA (K-DUR,KLOR-CON) 20 MEQ tablet   Oral   Take 20 mEq by mouth every morning.          . pregabalin (LYRICA) 50 MG capsule   Oral   Take 50 mg by mouth 2 (two) times  daily.          . ramipril (ALTACE) 5 MG capsule   Oral   Take 5 mg by mouth every other day.          . simvastatin (ZOCOR) 20 MG tablet   Oral   Take 20 mg by mouth every evening.           . vitamin B-12 (CYANOCOBALAMIN) 1000 MCG tablet   Oral   Take 1,000 mcg by mouth every morning.            BP 149/85  Pulse 68  Temp(Src) 98.2 F (36.8 C) (Oral)  Resp 20  SpO2 95% Vitals reviewed Physical Exam Physical Examination: General appearance - alert, well appearing, and in no distress Mental status - alert, oriented to person, place, and time Mouth - mucous membranes moist, pharynx normal without lesions Chest - clear to auscultation, no wheezes, rales or rhonchi, symmetric air entry Heart - normal rate, regular rhythm, normal S1, S2, no murmurs, rubs, clicks or gallops Abdomen - soft, nontender, nondistended, no masses or organomegaly, nabs Neurological - alert, oriented, normal speech, strength 5/5 in extremiteis x 4, sensation intact Extremities - peripheral pulses normal, no pedal edema, no clubbing or cyanosis Skin - normal coloration and turgor, no  rashes  ED Course  Procedures (including critical care time)   Date: 08/02/2012  Rate: 70  Rhythm: normal sinus rhythm  QRS Axis: normal  Intervals: normal  ST/T Wave abnormalities: normal  Conduction Disutrbances: none  Narrative Interpretation: unremarkable     Labs Reviewed  URINALYSIS, ROUTINE W REFLEX MICROSCOPIC - Abnormal; Notable for the following:    APPearance CLOUDY (*)    Glucose, UA 500 (*)    Bilirubin Urine SMALL (*)    All other components within normal limits  COMPREHENSIVE METABOLIC PANEL - Abnormal; Notable for the following:    Glucose, Bld 119 (*)    BUN 25 (*)    Creatinine, Ser 1.43 (*)    Albumin 3.2 (*)    Total Bilirubin 0.2 (*)    GFR calc non Af Amer 41 (*)    GFR calc Af Amer 47 (*)    All other components within normal limits  CBC - Abnormal; Notable for the following:    RBC 5.49 (*)    MCV 77.0 (*)    All other components within normal limits  GLUCOSE, CAPILLARY - Abnormal; Notable for the following:    Glucose-Capillary 143 (*)    All other components within normal limits   No results found.   1. Orthostatic hypotension       MDM  Pt presenting with lightheadedness and dizziness associated with hypotension, EKG reassuring. No chest pain.  Pt appears somewhat dehydrated.  BP improved after IV hydration, however still mildly orthostatic, will give another liter of fluids and reassess BP.  ADvised pt to hold her ramipril today and tomorrow.  Also to arrange close follow up with PMD.          Threasa Beards, MD 08/03/12 [phone]

## 2012-08-02 NOTE — ED Notes (Signed)
Patient was able to walk short distance. Did not have her cane and had unsteady gait.

## 2012-08-02 NOTE — ED Notes (Signed)
Pt unable to give urine sample at this time. Did try on bedpan.

## 2012-08-06 ENCOUNTER — Encounter (HOSPITAL_COMMUNITY): Payer: Self-pay | Admitting: *Deleted

## 2012-08-06 ENCOUNTER — Inpatient Hospital Stay (HOSPITAL_COMMUNITY)
Admission: EM | Admit: 2012-08-06 | Discharge: 2012-08-09 | DRG: 312 | Disposition: A | Discharge status: Home Health | Payer: PRIVATE HEALTH INSURANCE | Attending: Internal Medicine | Admitting: Internal Medicine

## 2012-08-06 ENCOUNTER — Emergency Department (HOSPITAL_COMMUNITY): Payer: PRIVATE HEALTH INSURANCE

## 2012-08-06 ENCOUNTER — Observation Stay (HOSPITAL_COMMUNITY): Payer: PRIVATE HEALTH INSURANCE

## 2012-08-06 DIAGNOSIS — Z79899 Other long term (current) drug therapy: Secondary | ICD-10-CM

## 2012-08-06 DIAGNOSIS — E1129 Type 2 diabetes mellitus with other diabetic kidney complication: Secondary | ICD-10-CM

## 2012-08-06 DIAGNOSIS — E870 Hyperosmolality and hypernatremia: Secondary | ICD-10-CM

## 2012-08-06 DIAGNOSIS — E039 Hypothyroidism, unspecified: Secondary | ICD-10-CM | POA: Diagnosis present

## 2012-08-06 DIAGNOSIS — N058 Unspecified nephritic syndrome with other morphologic changes: Secondary | ICD-10-CM

## 2012-08-06 DIAGNOSIS — H543 Unqualified visual loss, both eyes: Secondary | ICD-10-CM

## 2012-08-06 DIAGNOSIS — R4789 Other speech disturbances: Secondary | ICD-10-CM | POA: Diagnosis present

## 2012-08-06 DIAGNOSIS — E860 Dehydration: Secondary | ICD-10-CM

## 2012-08-06 DIAGNOSIS — G909 Disorder of the autonomic nervous system, unspecified: Secondary | ICD-10-CM | POA: Diagnosis present

## 2012-08-06 DIAGNOSIS — I10 Essential (primary) hypertension: Secondary | ICD-10-CM | POA: Diagnosis present

## 2012-08-06 DIAGNOSIS — H547 Unspecified visual loss: Secondary | ICD-10-CM

## 2012-08-06 DIAGNOSIS — E1039 Type 1 diabetes mellitus with other diabetic ophthalmic complication: Secondary | ICD-10-CM | POA: Diagnosis present

## 2012-08-06 DIAGNOSIS — E1121 Type 2 diabetes mellitus with diabetic nephropathy: Secondary | ICD-10-CM

## 2012-08-06 DIAGNOSIS — K3184 Gastroparesis: Secondary | ICD-10-CM | POA: Diagnosis present

## 2012-08-06 DIAGNOSIS — E119 Type 2 diabetes mellitus without complications: Secondary | ICD-10-CM | POA: Diagnosis present

## 2012-08-06 DIAGNOSIS — Z794 Long term (current) use of insulin: Secondary | ICD-10-CM

## 2012-08-06 DIAGNOSIS — H548 Legal blindness, as defined in USA: Secondary | ICD-10-CM | POA: Diagnosis present

## 2012-08-06 DIAGNOSIS — I951 Orthostatic hypotension: Principal | ICD-10-CM

## 2012-08-06 DIAGNOSIS — E1049 Type 1 diabetes mellitus with other diabetic neurological complication: Secondary | ICD-10-CM | POA: Diagnosis present

## 2012-08-06 DIAGNOSIS — R42 Dizziness and giddiness: Secondary | ICD-10-CM

## 2012-08-06 DIAGNOSIS — R4701 Aphasia: Secondary | ICD-10-CM

## 2012-08-06 DIAGNOSIS — E785 Hyperlipidemia, unspecified: Secondary | ICD-10-CM | POA: Diagnosis present

## 2012-08-06 LAB — URINALYSIS, ROUTINE W REFLEX MICROSCOPIC
Leukocytes, UA: NEGATIVE
Nitrite: NEGATIVE
Specific Gravity, Urine: 1.02 (ref 1.005–1.030)
Urobilinogen, UA: 0.2 mg/dL (ref 0.0–1.0)

## 2012-08-06 LAB — GLUCOSE, CAPILLARY
Glucose-Capillary: 171 mg/dL — ABNORMAL HIGH (ref 70–99)
Glucose-Capillary: 55 mg/dL — ABNORMAL LOW (ref 70–99)

## 2012-08-06 LAB — CBC
Platelets: 193 10*3/uL (ref 150–400)
RBC: 5.67 MIL/uL — ABNORMAL HIGH (ref 3.87–5.11)
RDW: 14.5 % (ref 11.5–15.5)
WBC: 10.2 10*3/uL (ref 4.0–10.5)

## 2012-08-06 LAB — BASIC METABOLIC PANEL
CO2: 28 mEq/L (ref 19–32)
Chloride: 106 mEq/L (ref 96–112)
GFR calc Af Amer: 62 mL/min — ABNORMAL LOW (ref >90)
Sodium: 146 mEq/L — ABNORMAL HIGH (ref 135–145)

## 2012-08-06 LAB — HEMOGLOBIN A1C
Hgb A1c MFr Bld: 12.3 % — ABNORMAL HIGH (ref <5.7)
Mean Plasma Glucose: 306 mg/dL — ABNORMAL HIGH (ref <117)

## 2012-08-06 LAB — OCCULT BLOOD, POC DEVICE: Fecal Occult Bld: NEGATIVE

## 2012-08-06 MED ORDER — ALBUTEROL SULFATE HFA 108 (90 BASE) MCG/ACT IN AERS
2.0000 | INHALATION_SPRAY | RESPIRATORY_TRACT | Status: DC | PRN
Start: 2012-08-06 — End: 2012-08-09

## 2012-08-06 MED ORDER — SIMVASTATIN 20 MG PO TABS
20.0000 mg | ORAL_TABLET | Freq: Every day | ORAL | Status: DC
  Administered 2012-08-06 – 2012-08-08 (×3): 20 mg via ORAL
  Filled 2012-08-06 (×4): qty 1

## 2012-08-06 MED ORDER — MONTELUKAST SODIUM 10 MG PO TABS
10.0000 mg | ORAL_TABLET | Freq: Every day | ORAL | Status: DC
  Administered 2012-08-06 – 2012-08-08 (×3): 10 mg via ORAL
  Filled 2012-08-06 (×4): qty 1

## 2012-08-06 MED ORDER — LIDOCAINE 5 % EX PTCH
1.0000 | MEDICATED_PATCH | CUTANEOUS | Status: DC | PRN
  Filled 2012-08-06: qty 1

## 2012-08-06 MED ORDER — SODIUM CHLORIDE 0.9 % IV BOLUS (SEPSIS)
1000.0000 mL | Freq: Once | INTRAVENOUS | Status: AC
  Administered 2012-08-06: 1000 mL via INTRAVENOUS

## 2012-08-06 MED ORDER — MECLIZINE HCL 25 MG PO TABS
25.0000 mg | ORAL_TABLET | Freq: Once | ORAL | Status: AC
  Administered 2012-08-06: 25 mg via ORAL
  Filled 2012-08-06: qty 1

## 2012-08-06 MED ORDER — INSULIN GLARGINE 100 UNIT/ML ~~LOC~~ SOLN
30.0000 [IU] | Freq: Every day | SUBCUTANEOUS | Status: DC
  Administered 2012-08-06 – 2012-08-07 (×2): 30 [IU] via SUBCUTANEOUS
  Filled 2012-08-06 (×2): qty 0.3

## 2012-08-06 MED ORDER — VITAMIN B-12 1000 MCG PO TABS
1000.0000 ug | ORAL_TABLET | Freq: Every morning | ORAL | Status: DC
  Administered 2012-08-07 – 2012-08-09 (×3): 1000 ug via ORAL
  Filled 2012-08-06 (×3): qty 1

## 2012-08-06 MED ORDER — INSULIN ASPART 100 UNIT/ML ~~LOC~~ SOLN
0.0000 [IU] | Freq: Three times a day (TID) | SUBCUTANEOUS | Status: DC
  Administered 2012-08-07: 1 [IU] via SUBCUTANEOUS
  Administered 2012-08-07: 7 [IU] via SUBCUTANEOUS
  Administered 2012-08-08: 3 [IU] via SUBCUTANEOUS

## 2012-08-06 MED ORDER — PREGABALIN 50 MG PO CAPS
50.0000 mg | ORAL_CAPSULE | Freq: Two times a day (BID) | ORAL | Status: DC
  Administered 2012-08-06 – 2012-08-09 (×6): 50 mg via ORAL
  Filled 2012-08-06 (×6): qty 1

## 2012-08-06 MED ORDER — INSULIN ASPART 100 UNIT/ML ~~LOC~~ SOLN
0.0000 [IU] | Freq: Every day | SUBCUTANEOUS | Status: DC
  Administered 2012-08-07 – 2012-08-08 (×2): 3 [IU] via SUBCUTANEOUS

## 2012-08-06 MED ORDER — HEPARIN SODIUM (PORCINE) 5000 UNIT/ML IJ SOLN
5000.0000 [IU] | Freq: Three times a day (TID) | INTRAMUSCULAR | Status: DC
  Administered 2012-08-06 – 2012-08-09 (×8): 5000 [IU] via SUBCUTANEOUS
  Filled 2012-08-06 (×11): qty 1

## 2012-08-06 MED ORDER — LEVOTHYROXINE SODIUM 137 MCG PO TABS
137.0000 ug | ORAL_TABLET | Freq: Every day | ORAL | Status: DC
  Administered 2012-08-07 – 2012-08-09 (×3): 137 ug via ORAL
  Filled 2012-08-06 (×4): qty 1

## 2012-08-06 MED ORDER — PANTOPRAZOLE SODIUM 40 MG PO TBEC
40.0000 mg | DELAYED_RELEASE_TABLET | Freq: Every morning | ORAL | Status: DC
  Administered 2012-08-07 – 2012-08-09 (×3): 40 mg via ORAL
  Filled 2012-08-06 (×5): qty 1

## 2012-08-06 MED ORDER — SODIUM CHLORIDE 0.9 % IV BOLUS (SEPSIS)
500.0000 mL | Freq: Once | INTRAVENOUS | Status: AC
  Administered 2012-08-06: 500 mL via INTRAVENOUS

## 2012-08-06 MED ORDER — SODIUM CHLORIDE 0.9 % IV SOLN: INTRAVENOUS | Status: DC

## 2012-08-06 MED ORDER — POTASSIUM CHLORIDE CRYS ER 20 MEQ PO TBCR
20.0000 meq | EXTENDED_RELEASE_TABLET | Freq: Every morning | ORAL | Status: DC
  Administered 2012-08-07 – 2012-08-09 (×3): 20 meq via ORAL
  Filled 2012-08-06 (×4): qty 1

## 2012-08-06 NOTE — ED Provider Notes (Signed)
Medical screening examination/treatment/procedure(s) were performed by non-physician practitioner and as supervising physician I was immediately available for consultation/collaboration.  Virgel Manifold, MD 08/06/12 [phone]

## 2012-08-06 NOTE — ED Notes (Signed)
Per pt:  Pt felt dizzy and checked BP at 0700: 82/49.  After having breakfast, the BP was 86/69.  Pt has been having dizziness for two weeks, but this morning the patient had near syncope. Pt states that she noticed slowed speech two days ago;  Pt had an appt at her PCP, Dr. Darlyne Russian, today but came to ER due to near syncope.  Pt denies N/V/D and pain.

## 2012-08-06 NOTE — H&P (Signed)
[facility] History and Physical  Amy [name] [id number] DOB: April 20, 1961 DOA: 08/06/2012  Referring physician: er PCP: Amy Peers, MD  Specialists:   Chief Complaint: dizziness  HPI: Amy [name] is a 52 y.o. female with long standing DM and complications including blindness, neuropathy and gastroparesis. She was seen in the ER a few days ago with the same complaints.  She has dizziness, lightheadedness when standing.  She was then found to have orthostatic Hypotension- given IVF and d/c'd home.  Today patient having same complaints along with word finding difficulties and slurred speech per caregiver (patient is blind).  Her BP at home was 86/69.   No N/V/D.  No CP, no SOB   In the ER, patient was still found to be orthostatic- IVF wil be given.  MRI done with no acute stroke but ?osmotic demyelination -- does not appear that patient has this.   Hospitalists were called to admit.    Review of Systems: all systems reviewed, negative unless stated above    Past Medical History  Diagnosis Date  . Asthma   . Diabetes mellitus   . Hypertension   . Blind   . Hypothyroidism   . Hyperlipidemia   . Diabetic neuropathy   . Blindness and low vision     left eye glass eye,  legally blind in right eye   Past Surgical History  Procedure Laterality Date  . Cesarean section      x 2  . Abdominal hysterectomy    . Cholecystectomy    . Enucleation     Social History:  reports that she quit smoking about 20 years ago. She has never used smokeless tobacco. She reports that she does not drink alcohol or use illicit drugs. From home, has caregivers  Allergies  Allergen Reactions  . Codeine     Gets sick  . Iohexol      Code: HIVES, Desc: pt developed itching and hives along with nasal congestion; needs 13 hour premeds for future studies, Onset Date: [id number]   . Penicillins     Gets sick  . Sulfa Antibiotics Nausea And Vomiting    Family History  Problem Relation  Age of Onset  . Diabetes Sister     Prior to Admission medications   Medication Sig Start Date End Date Taking? Authorizing Provider  albuterol (PROVENTIL HFA;VENTOLIN HFA) 108 (90 BASE) MCG/ACT inhaler Inhale 2 puffs into the lungs every 4 (four) hours as needed. For shortness of breath   Yes Historical Provider, MD  ibuprofen (ADVIL,MOTRIN) 200 MG tablet Take 400 mg by mouth every 6 (six) hours as needed for pain.   Yes Historical Provider, MD  insulin aspart (NOVOLOG) 100 UNIT/ML injection Inject 0-20 Units into the skin as directed. As needed based on home sliding scale   Yes Historical Provider, MD  insulin glargine (LANTUS) 100 UNIT/ML injection Inject 30 Units into the skin at bedtime.    Yes Historical Provider, MD  levothyroxine (SYNTHROID, LEVOTHROID) 137 MCG tablet Take 137 mcg by mouth every morning.    Yes Historical Provider, MD  lidocaine (LIDODERM) 5 % Place 1 patch onto the skin as needed (as needed for back pain). Remove & Discard patch within 12 hours or as directed by MD   Yes Historical Provider, MD  Menthol, Topical Analgesic, (ICY HOT EX) Apply 1 application topically at bedtime. Apply to back   Yes Historical Provider, MD  montelukast (SINGULAIR) 10 MG tablet Take 10 mg by mouth at bedtime.  Yes Historical Provider, MD  pantoprazole (PROTONIX) 40 MG tablet Take 40 mg by mouth every morning.    Yes Historical Provider, MD  potassium chloride SA (K-DUR,KLOR-CON) 20 MEQ tablet Take 20 mEq by mouth every morning.    Yes Historical Provider, MD  pregabalin (LYRICA) 50 MG capsule Take 50 mg by mouth 2 (two) times daily.    Yes Historical Provider, MD  ramipril (ALTACE) 5 MG capsule Take 5 mg by mouth every other day.    Yes Historical Provider, MD  simvastatin (ZOCOR) 20 MG tablet Take 20 mg by mouth every evening.     Yes Historical Provider, MD  vitamin B-12 (CYANOCOBALAMIN) 1000 MCG tablet Take 1,000 mcg by mouth every morning.    Yes Historical Provider, MD   Physical  Exam: Filed Vitals:   08/06/12 1123 08/06/12 1125 08/06/12 1330 08/06/12 1454  BP: 101/66 103/66 93/61 125/62  Pulse: 68 77  72  Temp:    98.2 F (36.8 C)  TempSrc:    Oral  Resp:    16  SpO2:    99%     General:  Blind- pleasant/cooperative, NAD  Eyes: glass eye  ENT: wnl  Neck: supple  Cardiovascular: rrr  Respiratory: clear anterior  Abdomen: +BS, soft, NT  Skin: no rashes or lesions  Musculoskeletal: moves all 4 extremities  Psychiatric: mood and affect normal  Neurologic: CN 2-12 grossly intact  Labs on Admission:  Basic Metabolic Panel:  Recent Labs Lab 08/02/12 1220 08/06/12 1026  NA 142 146*  K 3.8 4.3  CL 104 106  CO2 28 28  GLUCOSE 119* 162*  BUN 25* 19  CREATININE 1.43* 1.15*  CALCIUM 10.4 9.9   Liver Function Tests:  Recent Labs Lab 08/02/12 1220  AST 13  ALT 11  ALKPHOS 113  BILITOT 0.2*  PROT 7.7  ALBUMIN 3.2*   No results found for this basename: LIPASE, AMYLASE,  in the last 168 hours No results found for this basename: AMMONIA,  in the last 168 hours CBC:  Recent Labs Lab 08/02/12 1220 08/06/12 1026  WBC 9.4 10.2  HGB 14.6 14.8  HCT 42.3 43.7  MCV 77.0* 77.1*  PLT 213 193   Cardiac Enzymes: No results found for this basename: CKTOTAL, CKMB, CKMBINDEX, TROPONINI,  in the last 168 hours  BNP (last 3 results)  Recent Labs  08/13/11 1730  PROBNP 158.3*   CBG:  Recent Labs Lab 08/02/12 1133 08/06/12 1121  GLUCAP 143* 171*    Radiological Exams on Admission: Ct Head Wo Contrast  08/06/2012  *RADIOLOGY REPORT*  Clinical Data: Dysphagia.  Slurred speech.  Headache  CT HEAD WITHOUT CONTRAST  Technique:  Contiguous axial images were obtained from the base of the skull through the vertex without contrast.  Comparison: 10/20/2008  Findings: There is no evidence for acute hemorrhage, hydrocephalus, mass lesion, or abnormal extra-axial fluid collection.  No definite CT evidence for acute infarction.  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  The visualized paranasal sinuses and mastoid air cells are clear.  IMPRESSION: Stable exam.  No acute intracranial abnormality.   Original Report Authenticated By: [name], M.D.    Mr Brain Wo Contrast  08/06/2012  *RADIOLOGY REPORT*  Clinical Data: 52 year old female with dizziness, slurred speech, difficulty walking times several days.  MRI HEAD WITHOUT CONTRAST  Technique:  Multiplanar, multiecho pulse sequences of the brain and surrounding structures were obtained according to standard protocol  without intravenous contrast.  Comparison: Head CT without contrast 08/06/2012 and earlier.  Findings: No restricted diffusion to suggest acute infarction.  No midline shift, mass effect, evidence of mass lesion, ventriculomegaly, extra-axial collection or acute intracranial hemorrhage.  Cervicomedullary junction and pituitary are within normal limits.  Major intracranial vascular flow voids are preserved. Cerebral volume is within normal limits for age. Visualized cervical spine remarkable for C3-C4 and C4-C5 disc and endplate degeneration.  Confluent T2 and FLAIR hyperintensity in the pons (series 5 image 9).  No pontine expansion or mass effect.  Other brain stem structures are within normal limits.  Deep gray matter nuclei are within normal limits.  There is possibly a tiny chronic lacunar infarct in the right cerebellar hemisphere (image 8).  Cerebral white matter T2 and FLAIR hyperintensity is within normal limits for age.  No cortical encephalomalacia.  Normal bone marrow signal.  Small left maxillary sinus mucous retention cyst.  Mastoids are clear.  Grossly normal bilateral internal auditory structures.  Negative scalp soft tissues. 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  IMPRESSION: 1.  No acute stroke or definite acute intracranial abnormality. 2.  Confluent signal abnormality in the pons is  nonspecific and can be due simply to ordinary chronic small vessel disease, but consider also osmotic demyelination in the appropriate clinical setting. 3.  Supratentorial brain within normal limits for age.  Possible tiny chronic lacunar infarct in the right cerebellum. 4.  Chronic changes to the orbits.   Original Report Authenticated By: [name], M.D.       Assessment/Plan Principal Problem:   Dizziness Active Problems:   Blindness   DM (diabetes mellitus)   Orthostatic hypotension   Hypernatremia   1. Dizziness- stroke work up to r/o TIA vs CVA- with MRI being negative for CVA doubtful this is the cause- MRI was possibly suggestive of ?osmotic demyelination- ?neuro consult based on patient's improvement, monitor on tele 2. Orthostatic hypotension- IVF- patient has not been eating much, tilt test in AM- ? Autonomic insufficiency, check cortisol, TSH 3. DM- diabetic for 20+years, check HgbA1C, SSI, continue lantus 4. Blindness     Code Status: full Family Communication: patient at bedside Disposition Plan: home 2-3 days  Time spent: [place], [facility] Pager [phone]  If 7PM-7AM, please contact night-coverage [email or web address] Password [facility] 08/06/2012, 3:27 PM

## 2012-08-06 NOTE — ED Notes (Signed)
Patient transported to MRI 

## 2012-08-06 NOTE — ED Notes (Signed)
Patient transported to CT 

## 2012-08-06 NOTE — ED Notes (Signed)
Per Network engineer on [place], pt will be going to room 19.

## 2012-08-06 NOTE — ED Provider Notes (Signed)
History     CSN: [id number]  Arrival date & time 08/06/12  [id number]   First MD Initiated Contact with Patient 08/06/12 1002      No chief complaint on file.   (Consider location/radiation/quality/duration/timing/severity/associated sxs/prior treatment) HPI Comments: Amy [name] is a 52 y.o. female with a history of diabetes, hypertension, hyperlipidemia and blindness presents emergency department with multiple complaints.  Patient reports that she was evaluated 4 days ago in the emergency department for dizziness and lightheadedness when standing.  She was found to have orthostatic hypotension for which she was given IV fluids for.  Previous labs reviewed with no significant chemistry abnormalities.  Today patient reports that her drop in blood pressure and dizziness has not improved since hospital discharge.  In addition she reports intermittent aphasia that started 2 days ago.  Time onset and duration is unknown by patient.  She states "it's hard for me to get words out, I know what I want to say but I can't get the words to come out".  Caretaker at bedside states that she noticed change in speech as well.  Patient is not having any difficulty ambulating and denies any unilateral weakness, change in coordination, headaches, ataxia, syncope, chest pain, shortness of breath.  PCP Dr. Criss Rosales, was supposed to see today at 10:30, but feeling really bad so came to ER  The history is provided by the patient.    Past Medical History  Diagnosis Date  . Asthma   . Diabetes mellitus   . Hypertension   . Blind   . Hypothyroidism   . Hyperlipidemia   . Diabetic neuropathy   . Blindness and low vision     left eye glass eye,  legally blind in right eye    Past Surgical History  Procedure Laterality Date  . Cesarean section      x 2  . Abdominal hysterectomy    . Cholecystectomy    . Enucleation      Family History  Problem Relation Age of Onset  . Diabetes Sister     History   Substance Use Topics  . Smoking status: Former Smoker    Quit date: 05/22/1992  . Smokeless tobacco: Never Used  . Alcohol Use: No    OB History   Grav Para Term Preterm Abortions TAB SAB Ect Mult Living                  Review of Systems  Constitutional: Negative for fever, chills, diaphoresis, activity change and appetite change.  HENT: Negative for congestion and neck pain.   Eyes: Negative for visual disturbance.  Respiratory: Negative for cough and shortness of breath.   Cardiovascular: Negative for chest pain and leg swelling.  Gastrointestinal: Negative for abdominal pain.  Genitourinary: Negative for dysuria, urgency and frequency.  Musculoskeletal: Negative for myalgias.  Skin: Negative for color change and wound.  Neurological: Positive for dizziness, speech difficulty, weakness and light-headedness. Negative for seizures, syncope, facial asymmetry, numbness and headaches.  Psychiatric/Behavioral: Negative for confusion.  All other systems reviewed and are negative.    Allergies  Codeine; Iohexol; Penicillins; and Sulfa antibiotics  Home Medications   Current Outpatient Rx  Name  Route  Sig  Dispense  Refill  . albuterol (PROVENTIL HFA;VENTOLIN HFA) 108 (90 BASE) MCG/ACT inhaler   Inhalation   Inhale 2 puffs into the lungs every 4 (four) hours as needed. For shortness of breath         . ibuprofen (ADVIL,MOTRIN) 200  MG tablet   Oral   Take 400 mg by mouth every 6 (six) hours as needed for pain.         Marland Kitchen insulin aspart (NOVOLOG) 100 UNIT/ML injection   Subcutaneous   Inject 0-20 Units into the skin as directed. As needed based on home sliding scale         . insulin glargine (LANTUS) 100 UNIT/ML injection   Subcutaneous   Inject 30 Units into the skin at bedtime.          Marland Kitchen levothyroxine (SYNTHROID, LEVOTHROID) 137 MCG tablet   Oral   Take 137 mcg by mouth every morning.          . lidocaine (LIDODERM) 5 %   Transdermal   Place 1 patch  onto the skin as needed (as needed for back pain). Remove & Discard patch within 12 hours or as directed by MD         . Menthol, Topical Analgesic, (ICY HOT EX)   Apply externally   Apply 1 application topically at bedtime. Apply to back         . montelukast (SINGULAIR) 10 MG tablet   Oral   Take 10 mg by mouth at bedtime.           . pantoprazole (PROTONIX) 40 MG tablet   Oral   Take 40 mg by mouth every morning.          . potassium chloride SA (K-DUR,KLOR-CON) 20 MEQ tablet   Oral   Take 20 mEq by mouth every morning.          . pregabalin (LYRICA) 50 MG capsule   Oral   Take 50 mg by mouth 2 (two) times daily.          . ramipril (ALTACE) 5 MG capsule   Oral   Take 5 mg by mouth every other day.          . simvastatin (ZOCOR) 20 MG tablet   Oral   Take 20 mg by mouth every evening.           . vitamin B-12 (CYANOCOBALAMIN) 1000 MCG tablet   Oral   Take 1,000 mcg by mouth every morning.            BP 103/66  Pulse 77  Temp(Src) 97.7 F (36.5 C) (Oral)  Resp 12  SpO2 100%  Physical Exam  Nursing note and vitals reviewed. Constitutional: She is oriented to person, place, and time. She appears well-developed and well-nourished. No distress.  HENT:  Head: Normocephalic and atraumatic.  Dry mucous membranes.   Eyes: Conjunctivae and EOM are normal.  Neck: Normal range of motion.  Cardiovascular:  RRR, intact distal pulses  Pulmonary/Chest: Effort normal.  Abdominal:  Obese abdomen with diffuse ttp consistent w pts chronic gastroparesis   Musculoskeletal: Normal range of motion.  Neurological: She is alert and oriented to person, place, and time.  Blind w left glass eye- unable to assess occular CN d/t baseline, all other CN intact. Mild left sided facial droop. Good coordination, intact sensation.   Skin: Skin is warm and dry. No rash noted. She is not diaphoretic.  Psychiatric: She has a normal mood and affect. Her behavior is normal.     ED Course  Procedures (including critical care time)  Labs Reviewed  CBC - Abnormal; Notable for the following:    RBC 5.67 (*)    MCV 77.1 (*)    All other components within  normal limits  BASIC METABOLIC PANEL - Abnormal; Notable for the following:    Sodium 146 (*)    Glucose, Bld 162 (*)    Creatinine, Ser 1.15 (*)    GFR calc non Af Amer 53 (*)    GFR calc Af Amer 62 (*)    All other components within normal limits  GLUCOSE, CAPILLARY - Abnormal; Notable for the following:    Glucose-Capillary 171 (*)    All other components within normal limits  URINALYSIS, ROUTINE W REFLEX MICROSCOPIC  OCCULT BLOOD, POC DEVICE   Ct Head Wo Contrast  08/06/2012  *RADIOLOGY REPORT*  Clinical Data: Dysphagia.  Slurred speech.  Headache  CT HEAD WITHOUT CONTRAST  Technique:  Contiguous axial images were obtained from the base of the skull through the vertex without contrast.  Comparison: 10/20/2008  Findings: There is no evidence for acute hemorrhage, hydrocephalus, mass lesion, or abnormal extra-axial fluid collection.  No definite CT evidence for acute infarction.  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  The visualized paranasal sinuses and mastoid air cells are clear.  IMPRESSION: Stable exam.  No acute intracranial abnormality.   Original Report Authenticated By: [name], M.D.      No diagnosis found.   Date: 08/06/2012  Rate: 70  Rhythm: normal sinus rhythm  QRS Axis: normal  Intervals: normal  ST/T Wave abnormalities: normal  Conduction Disutrbances: none  Narrative Interpretation:   Old EKG Reviewed: No significant changes noted     MDM  52 yo F w hx of DM, HLD, & HTN presents today complaining of orthostatic dizziness and acuty onset of intermittent aphasia. General labs, orthostatics, Antivert and IVFs started. CT head ordered. Plan is to admit pt for TIA wk up. Discussed with attending who is agreeable with plan.    IVFs given, pt still w +orthostatic Hypotension. MRI brain ordered in ER. Pt to be admitted to telemetry for TIA wk up/pbservation. The patient appears reasonably stabilized for admission considering the current resources, flow, and capabilities available in the ED at this time, and I doubt any other [facility] requiring further screening and/or treatment in the ED prior to admission.              Verl Dicker, [place] 08/06/12 1259

## 2012-08-06 NOTE — ED Notes (Addendum)
MRI on the way; will administer NS bolus upon return.  Pt reports not being claustrophobic during MRIs.   Addendum: Administered bolus prior to MRI.

## 2012-08-07 DIAGNOSIS — E870 Hyperosmolality and hypernatremia: Secondary | ICD-10-CM

## 2012-08-07 DIAGNOSIS — R4701 Aphasia: Secondary | ICD-10-CM

## 2012-08-07 DIAGNOSIS — I951 Orthostatic hypotension: Principal | ICD-10-CM

## 2012-08-07 DIAGNOSIS — E860 Dehydration: Secondary | ICD-10-CM

## 2012-08-07 LAB — LIPID PANEL
Cholesterol: 136 mg/dL (ref 0–200)
Total CHOL/HDL Ratio: 3.2 RATIO
Triglycerides: 90 mg/dL (ref <150)
VLDL: 18 mg/dL (ref 0–40)

## 2012-08-07 LAB — GLUCOSE, CAPILLARY: Glucose-Capillary: 123 mg/dL — ABNORMAL HIGH (ref 70–99)

## 2012-08-07 MED ORDER — SIMETHICONE 80 MG PO CHEW
80.0000 mg | CHEWABLE_TABLET | Freq: Once | ORAL | Status: AC
  Administered 2012-08-07: 80 mg via ORAL
  Filled 2012-08-07: qty 1

## 2012-08-07 NOTE — Progress Notes (Signed)
  Echocardiogram 2D Echocardiogram has been performed.  Geoff Dacanay 08/07/2012, 10:22 AM

## 2012-08-07 NOTE — Progress Notes (Signed)
*  PRELIMINARY RESULTS* Vascular Ultrasound Carotid Duplex (Doppler) has been completed.  Preliminary findings: Bilateral:  No evidence of hemodynamically significant internal carotid artery stenosis.   Vertebral artery flow is antegrade.      Landry Mellow, RDMS, RVT  08/07/2012, 9:20 AM

## 2012-08-07 NOTE — Evaluation (Signed)
Physical Therapy Evaluation Patient Details Name: Amy [name] MRN: [name] DOB: Sep 09, 1960 Today's Date: 08/07/2012 Time: [id number] PT Time Calculation (min): 20 min  PT Assessment / Plan / Recommendation Clinical Impression  52 yo female admitted with dizziness, orthostatic hypotension. PT IS BLIND. Pt lives alone but has aide M-Sun for ~2-3 hours. On eval, pt required Min assist for mobility. Pt demonstrates unsteady gait, impaired balance even with 1 HHA. Pt states she has a walker but has difficulty using it inside home. Pt reports she has a friend that can check on her as well. Recommend HHPT vs SNF, depending on progress. Since pt lives alone, she will need to be at least supervision level assist with mobility (in this environment) to safely d/c home. Pt would like to d/c home.     PT Assessment  Patient needs continued PT services    Follow Up Recommendations  Home health PT;Supervision for mobility/OOB vs SNF (depending on progress)    Does the patient have the potential to tolerate intense rehabilitation      Barriers to Discharge        Equipment Recommendations  None recommended by PT    Recommendations for Other Services OT consult   Frequency Min 3X/week    Precautions / Restrictions Precautions Precautions: Fall Restrictions Weight Bearing Restrictions: No   Pertinent Vitals/Pain BP: 116/74 supine        113/78 sitting        115/79 standing        110/74 after walking ~ 100 feet    Mobility  Bed Mobility Bed Mobility: Supine to Sit Supine to Sit: 5: Supervision Details for Bed Mobility Assistance: Cues for safety Transfers Transfers: Sit to Stand;Stand to Sit Sit to Stand: 4: Min assist;From bed Stand to Sit: 4: Min assist;To chair/3-in-1 Details for Transfer Assistance: VCS safety, technique, hand placement. Assist to rise, stabilize. Guiding assistance to allow pt to safely sit.  Ambulation/Gait Ambulation/Gait Assistance: 4: Min  assist Ambulation Distance (Feet): 100 Feet Assistive device: 1 person hand held assist Ambulation/Gait Assistance Details: 1 HHA to simulate cane (pt states she was using just prior to admission, but that she doesn't normally need cane). Assist to stabilize throughout ambulation. Unsteady gait. Pt also unfamiliar with environment Gait Pattern: Decreased stride length;Decreased step length - right;Decreased step length - left;Step-through pattern    Exercises     PT Diagnosis: Difficulty walking;Abnormality of gait  PT Problem List: Decreased mobility;Decreased balance PT Treatment Interventions: DME instruction;Gait training;Functional mobility training;Therapeutic exercise;Therapeutic activities;Balance training;Patient/family education   PT Goals Acute Rehab PT Goals PT Goal Formulation: With patient Time For Goal Achievement: 08/14/12 Potential to Achieve Goals: Good Pt will go Sit to Stand: with supervision PT Goal: Sit to Stand - Progress: Goal set today Pt will Ambulate: 51 - 150 feet;with supervision;with least restrictive assistive device PT Goal: Ambulate - Progress: Goal set today  Visit Information  Last PT Received On: 08/07/12 Assistance Needed: +1    Subjective Data  Subjective: I feel better today Patient Stated Goal: home   Prior Functioning  Home Living Lives With: Alone Available Help at Discharge: Personal care attendant;Friend(s) (Mon-Sun (2-3 hours)) Type of Home: Apartment Home Access: Level entry Home Layout: One level Bathroom Shower/Tub: Chiropodist: Standard Home Adaptive Equipment: Straight cane;Tub transfer bench;Wheelchair - [employer] - four wheeled Prior Function Level of Independence: Needs assistance Needs Assistance: Bathing;Dressing;Light Housekeeping;Meal Prep Bath: Minimal Dressing: Minimal Meal Prep: Total Light Housekeeping: Total Comments: assist to get  into shower. pt states she is able to bathe herself.   Communication Communication: No difficulties    Cognition  Cognition Overall Cognitive Status: Appears within functional limits for tasks assessed/performed Arousal/Alertness: Awake/alert Orientation Level: Appears intact for tasks assessed Behavior During Session: [facility] for tasks performed    Extremity/Trunk Assessment Right Lower Extremity Assessment RLE ROM/Strength/Tone: Deficits RLE ROM/Strength/Tone Deficits: Strength at least 4/5 with functional activity Left Lower Extremity Assessment LLE ROM/Strength/Tone: Deficits LLE ROM/Strength/Tone Deficits: Strength at least 4/5 with functional activity   Balance    End of Session PT - End of Session Equipment Utilized During Treatment: Gait belt Activity Tolerance: Patient tolerated treatment well Patient left: in chair;with call bell/phone within reach  GP Functional Assessment Tool Used: clinical observation Functional Limitation: Mobility: Walking and moving around Mobility: Walking and Moving Around Current Status [id number]): At least 20 percent but less than 40 percent impaired, limited or restricted Mobility: Walking and Moving Around Goal Status [phone]): At least 1 percent but less than 20 percent impaired, limited or restricted   Weston Anna, MPT Pager: [phone]

## 2012-08-07 NOTE — Evaluation (Signed)
Occupational Therapy Evaluation Patient Details Name: Amy [name] MRN: [id number] DOB: June 26, 1960 Today's Date: 08/07/2012 Time: [id number] OT Time Calculation (min): 31 min  OT Assessment / Plan / Recommendation Clinical Impression  Pt is a 52 yr old female with history of visual impairment admitted for increased dizziness.  Currently still hypotensive during this therapy session.  BP in sitting with feet elevated 100/71, in sitting with feet on the floor 98/66 and 82/60.  Unable to obtain BP in standing before pt became too dizzy to remain standing.. Pt overall min assist level for simulated selfcare tasks and functional transfers.  Feel pt will benefit from acute OT services to increase balance and activity tolerance.  Feel that since pt does not have 24 hour supervision at discharge, she will benefit from SNF for follow-up rehab.    OT Assessment  Patient needs continued OT Services    Follow Up Recommendations  SNF    Barriers to Discharge Decreased caregiver support    Equipment Recommendations  None recommended by OT       Frequency  Min 2X/week    Precautions / Restrictions Precautions Precautions: Fall Restrictions Weight Bearing Restrictions: No   Pertinent Vitals/Pain BP in sitting 100/71 with feet elevated in recliner, 98/66 and 82/60 sitting with feet on the ground    ADL  Eating/Feeding: Performed;Modified independent Where Assessed - Eating/Feeding: Chair Grooming: Teeth care;Set up;Performed Where Assessed - Grooming: Supported sitting Upper Body Bathing: Simulated;Set up Where Assessed - Upper Body Bathing: Unsupported sitting Lower Body Bathing: Simulated;Minimal assistance Where Assessed - Lower Body Bathing: Supported sit to stand Upper Body Dressing: Simulated;Set up Where Assessed - Upper Body Dressing: Unsupported sitting Lower Body Dressing: Simulated;Minimal assistance Where Assessed - Lower Body Dressing: Supported sit to stand Toilet Transfer:  Simulated;Minimal assistance Toilet Transfer Method: Stand Ecologist: Other (comment) (to bedside chair from EOB) Toileting - Clothing Manipulation and Hygiene: Simulated;Minimal assistance Where Assessed - Best boy and Hygiene: Other (comment) (sit to stand from EOB) Tub/Shower Transfer Method: Not assessed Transfers/Ambulation Related to ADLs: Pt needs min assist for stand pivot transfers from bed to bedside chair. ADL Comments: Pt still orthostatic in sitting with feet elevated BP was 100/71 and with feet on the floor 98/66.  Second BP in sitting with feet on the floor 82/60.      OT Diagnosis: Generalized weakness  OT Problem List: Decreased strength;Decreased activity tolerance;Impaired balance (sitting and/or standing);Decreased knowledge of use of DME or AE OT Treatment Interventions: Self-care/ADL training;Therapeutic activities;Therapeutic exercise;Patient/family education;DME and/or AE instruction;Energy conservation;Balance training   OT Goals Acute Rehab OT Goals OT Goal Formulation: With patient Time For Goal Achievement: 08/21/12 Potential to Achieve Goals: Good ADL Goals Pt Will Perform Grooming: with supervision;Unsupported;Standing at sink ADL Goal: Grooming - Progress: Goal set today Pt Will Perform Lower Body Bathing: with supervision;Sit to stand from chair ADL Goal: Lower Body Bathing - Progress: Goal set today Pt Will Perform Lower Body Dressing: with supervision;Sit to stand from chair;Sit to stand from bed ADL Goal: Lower Body Dressing - Progress: Goal set today Pt Will Transfer to Toilet: with supervision;Ambulation;3-in-1 ADL Goal: Toilet Transfer - Progress: Goal set today Pt Will Perform Toileting - Clothing Manipulation: with supervision;Sitting on 3-in-1 or toilet;Standing ADL Goal: Toileting - Clothing Manipulation - Progress: Goal set today Pt Will Perform Toileting - Hygiene: with supervision;with adaptive  equipment;Sit to stand from 3-in-1/toilet ADL Goal: Toileting - Hygiene - Progress: Goal set today Miscellaneous OT Goals Miscellaneous OT Goal #1:  Pt will perform light therex UE exercises bilaterall in order to increase shoulder strength to 4/5 for greater independence with selfcare tasks.  Visit Information  Last OT Received On: 08/07/12 Assistance Needed: +1    Subjective Data  Subjective: I have assistance for 2 hours after that I'm by myself. Patient Stated Goal: Pt did not state but agreeable to getting up with OT.   Prior Functioning     Home Living Lives With: Alone Available Help at Discharge: Personal care attendant;Friend(s) (Mon-Sun (2-3 hours)) Type of Home: Apartment Home Access: Level entry Home Layout: One level Bathroom Shower/Tub: Chiropodist: Standard Home Adaptive Equipment: Straight cane;Tub transfer bench;Wheelchair - [employer] - four wheeled Prior Function Level of Independence: Needs assistance Needs Assistance: Bathing;Dressing;Light Housekeeping;Meal Prep Bath: Minimal Dressing: Minimal Meal Prep: Total Light Housekeeping: Total Communication Communication: No difficulties Dominant Hand: Right         Vision/Perception Vision - History Baseline Vision: Legally blind Patient Visual Report: No change from baseline Vision - Assessment Vision Assessment: Vision not tested Perception Perception: Within Functional Limits Praxis Praxis: Intact   Cognition  Cognition Overall Cognitive Status: Appears within functional limits for tasks assessed/performed Arousal/Alertness: Awake/alert Orientation Level: Appears intact for tasks assessed Behavior During Session: [facility] for tasks performed    Extremity/Trunk Assessment Right Upper Extremity Assessment RUE ROM/Strength/Tone: Deficits RUE ROM/Strength/Tone Deficits: AROM shoulder flexion 0-130 degrees and slight decreased AROM shoulder external rotation when attempting to  reach behind her neck bilaterally.  Overall strength 3+/5 RUE Sensation: WFL - Light Touch RUE Coordination: WFL - gross/fine motor Left Upper Extremity Assessment LUE ROM/Strength/Tone: Within functional levels;Deficits LUE ROM/Strength/Tone Deficits: AROM shoulder flexion 0-130 degrees and slight decreased AROM shoulder external rotation when attempting to reach behind her neck bilaterally. Overall strength 3+/5 LUE Sensation: WFL - Light Touch LUE Coordination: WFL - gross/fine motor Right Lower Extremity Assessment RLE ROM/Strength/Tone: Deficits RLE ROM/Strength/Tone Deficits: Strength at least 4/5 with functional activity Left Lower Extremity Assessment LLE ROM/Strength/Tone: Deficits LLE ROM/Strength/Tone Deficits: Strength at least 4/5 with functional activity Trunk Assessment Trunk Assessment: Normal     Mobility Bed Mobility Bed Mobility: Supine to Sit Supine to Sit: 5: Supervision Details for Bed Mobility Assistance: Cues for safety Transfers Transfers: Sit to Stand Sit to Stand: 4: Min assist;With upper extremity assist;With armrests;From chair/3-in-1 Stand to Sit: To bed;4: Min assist;To elevated surface Details for Transfer Assistance: VCS safety, technique, hand placement. Assist to rise, stabilize. Guiding assistance to allow pt to safely sit.         Balance Balance Balance Assessed: Yes Static Standing Balance Static Standing - Balance Support: No upper extremity supported Static Standing - Level of Assistance: 4: Min assist High Level Balance High Level Balance Comments: Pt with LOB posteriorly at EOB.   End of Session OT - End of Session Equipment Utilized During Treatment: Gait belt Activity Tolerance: Patient tolerated treatment well Patient left: with call bell/phone within reach;in chair Nurse Communication: Mobility status;Other (comment) (BP issues)  GO Functional Assessment Tool Used: clinical judgement Functional Limitation: Self care Self  Care Current Status [id number]): At least 20 percent but less than 40 percent impaired, limited or restricted Self Care Goal Status [id number]): At least 1 percent but less than 20 percent impaired, limited or restricted   Josafat Enrico OTR/L Pager number [phone] 08/07/2012, 2:42 PM

## 2012-08-07 NOTE — Progress Notes (Addendum)
Inpatient Diabetes Program Recommendations  AACE/ADA: New Consensus Statement on Inpatient Glycemic Control (2013)  Target Ranges:  Prepandial:   less than 140 mg/dL      Peak postprandial:   less than 180 mg/dL (1-2 hours)      Critically ill patients:  140 - 180 mg/dL   Reason for Visit: Results for Amy [name], Amy [name] (MRN [id number]) as of 08/07/2012 14:45  Ref. Range 08/06/2012 22:43 08/07/2012 05:25 08/07/2012 07:10 08/07/2012 10:59 08/07/2012 11:21  Glucose-Capillary Latest Range: 70-99 mg/dL 148 (H)  82  334 (H)   Note history of diabetes-Type 1.  Please add Novolog meal coverage 4 units tid with meals to cover meal intake. Note that A1C=12.3% indicating poor control of diabetes prior to admit.  Will need follow-up with PCP. Thanks,  Adah Perl, RN, MSN, BC-ADM [phone]    Note:  Spoke to patient regarding her home glycemic control. She states that she was on Lantus and "pill" for diabetes.  However she has been on cortisone shots which have increased her blood sugars.  MD added Novolog with breakfast and in the evening.  Patient uses insulin pens so that she can hear the clicks in order to administer insulin.  She also has a talking meter but she admits that she rarely checks her CBG's.  She has an aid that comes from 8 am til 11 am Monday thru Saturday.  She is interested in meal planning information that she can share with the aid that cooks for her.  Will order "dietician consult".  Also it appears that patient likely needs meal coverage with each meal.  Will follow.

## 2012-08-07 NOTE — Progress Notes (Signed)
[facility] PROGRESS NOTE  Amy [name] [id number] DOB: 1960/06/02 DOA: 08/06/2012 PCP: [name], MD  Assessment/Plan: 1. Dizziness: - MRI of head obtained which reported no acute stroke or definite acute intracranial abnormality. States that confluent signal abnormality is the pons is nonspecific and can be due to simply to ordinary chronic small vessel disease, but consider also osmotic demyelination in the appropriate clinical setting.   - while in house sodium has gone from 142 to 146 and patient reports improvement in symptoms today therefore osmotic demyelination not likely. - Awaiting carotid and cardiac ultrasound. Results Pending.  2. DM: - Place on SSI - Diabetic diet - continue Lantus - diabetic care manager on board.  3. Hypernatremia - patient reportedly eating and drinking well - will saline lock.  Recheck sodium level next am.  4. Orthostatic hypotension - resolved - most recent orthostatic vitals negative. - most likely initially contributed to # 1 most likely  Code Status: full Family Communication: no family at bedside.  Disposition Plan: Pending further evaluation next am.   Consultants:  None  Procedures:  none  Antibiotics:  none  HPI/Subjective: Patient reports feeling better to me today. No acute issues reported overnight.  Objective: Filed Vitals:   08/07/12 1046 08/07/12 1047 08/07/12 1345 08/07/12 1440  BP: 113/78 115/79 100/71 102/66  Pulse:    69  Temp:    98 F (36.7 C)  TempSrc:    Oral  Resp:      Height:      Weight:      SpO2:   97% 97%    Intake/Output Summary (Last 24 hours) at 08/07/12 1717 Last data filed at 08/07/12 1204  Gross per 24 hour  Intake    480 ml  Output      2 ml  Net    478 ml   Filed Weights   08/07/12 0510  Weight: 81.4 kg (179 lb 7.3 oz)    Exam:   General:  Pt in NAD, Alert and Awake  Cardiovascular: RRR, No rubs  Respiratory: CTa BL, no wheezes  Abdomen: soft, NT,  ND  Musculoskeletal: no clubbing   Data Reviewed: Basic Metabolic Panel:  Recent Labs Lab 08/02/12 1220 08/06/12 1026  NA 142 146*  K 3.8 4.3  CL 104 106  CO2 28 28  GLUCOSE 119* 162*  BUN 25* 19  CREATININE 1.43* 1.15*  CALCIUM 10.4 9.9   Liver Function Tests:  Recent Labs Lab 08/02/12 1220  AST 13  ALT 11  ALKPHOS 113  BILITOT 0.2*  PROT 7.7  ALBUMIN 3.2*   No results found for this basename: LIPASE, AMYLASE,  in the last 168 hours No results found for this basename: AMMONIA,  in the last 168 hours CBC:  Recent Labs Lab 08/02/12 1220 08/06/12 1026  WBC 9.4 10.2  HGB 14.6 14.8  HCT 42.3 43.7  MCV 77.0* 77.1*  PLT 213 193   Cardiac Enzymes: No results found for this basename: CKTOTAL, CKMB, CKMBINDEX, TROPONINI,  in the last 168 hours BNP (last 3 results)  Recent Labs  08/13/11 1730  PROBNP 158.3*   CBG:  Recent Labs Lab 08/06/12 1931 08/06/12 2243 08/07/12 0710 08/07/12 1121 08/07/12 1702  GLUCAP 112* 148* 82 334* 123*    No results found for this or any previous visit (from the past 240 hour(s)).   Studies: Ct Head Wo Contrast  08/06/2012  *RADIOLOGY REPORT*  Clinical Data: Dysphagia.  Slurred speech.  Headache  CT HEAD  WITHOUT CONTRAST  Technique:  Contiguous axial images were obtained from the base of the skull through the vertex without contrast.  Comparison: 10/20/2008  Findings: There is no evidence for acute hemorrhage, hydrocephalus, mass lesion, or abnormal extra-axial fluid collection.  No definite CT evidence for acute infarction.  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  The visualized paranasal sinuses and mastoid air cells are clear.  IMPRESSION: Stable exam.  No acute intracranial abnormality.   Original Report Authenticated By: [name], M.D.    Amy [name] Wo Contrast  08/06/2012  *RADIOLOGY REPORT*  Clinical Data: 52 year old female with dizziness, slurred speech,  difficulty walking times several days.  MRI HEAD WITHOUT CONTRAST  Technique:  Multiplanar, multiecho pulse sequences of the [name] and surrounding structures were obtained according to standard protocol without intravenous contrast.  Comparison: Head CT without contrast 08/06/2012 and earlier.  Findings: No restricted diffusion to suggest acute infarction.  No midline shift, mass effect, evidence of mass lesion, ventriculomegaly, extra-axial collection or acute intracranial hemorrhage.  Cervicomedullary junction and pituitary are within normal limits.  Major intracranial vascular flow voids are preserved. Cerebral volume is within normal limits for age. Visualized cervical spine remarkable for C3-C4 and C4-C5 disc and endplate degeneration.  Confluent T2 and FLAIR hyperintensity in the pons (series 5 image 9).  No pontine expansion or mass effect.  Other [name] stem structures are within normal limits.  Deep gray matter nuclei are within normal limits.  There is possibly a tiny chronic lacunar infarct in the right cerebellar hemisphere (image 8).  Cerebral white matter T2 and FLAIR hyperintensity is within normal limits for age.  No cortical encephalomalacia.  Normal bone marrow signal.  Small left maxillary sinus mucous retention cyst.  Mastoids are clear.  Grossly normal bilateral internal auditory structures.  Negative scalp soft tissues. 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  IMPRESSION: 1.  No acute stroke or definite acute intracranial abnormality. 2.  Confluent signal abnormality in the pons is nonspecific and can be due simply to ordinary chronic small vessel disease, but consider also osmotic demyelination in the appropriate clinical setting. 3.  Supratentorial [name] within normal limits for age.  Possible tiny chronic lacunar infarct in the right cerebellum. 4.  Chronic changes to the orbits.   Original Report Authenticated By: [name], M.D.     Scheduled Meds: .  heparin  5,000 Units Subcutaneous Q8H  . insulin aspart  0-5 Units Subcutaneous QHS  . insulin aspart  0-9 Units Subcutaneous TID WC  . insulin glargine  30 Units Subcutaneous QHS  . levothyroxine  137 mcg Oral QAC breakfast  . montelukast  10 mg Oral QHS  . pantoprazole  40 mg Oral q morning - 10a  . potassium chloride SA  20 mEq Oral q morning - 10a  . pregabalin  50 mg Oral BID  . simvastatin  20 mg Oral q1800  . vitamin B-12  1,000 mcg Oral q morning - 10a   Continuous Infusions:   Principal Problem:   Dizziness Active Problems:   Blindness   DM (diabetes mellitus)   Orthostatic hypotension   Hypernatremia    Time spent: > 35 minutes    Velvet Bathe  [facility] Pager [phone]. If 7PM-7AM, please contact night-coverage at [email or web address], password [facility] 08/07/2012, 5:17 PM  LOS: 1 day

## 2012-08-08 DIAGNOSIS — R42 Dizziness and giddiness: Secondary | ICD-10-CM

## 2012-08-08 LAB — BASIC METABOLIC PANEL
GFR calc Af Amer: 89 mL/min — ABNORMAL LOW (ref >90)
GFR calc non Af Amer: 77 mL/min — ABNORMAL LOW (ref >90)
Potassium: 4.5 mEq/L (ref 3.5–5.1)
Sodium: 139 mEq/L (ref 135–145)

## 2012-08-08 LAB — GLUCOSE, CAPILLARY
Glucose-Capillary: 221 mg/dL — ABNORMAL HIGH (ref 70–99)
Glucose-Capillary: 89 mg/dL (ref 70–99)
Glucose-Capillary: 268 mg/dL — ABNORMAL HIGH (ref 70–99)

## 2012-08-08 LAB — RAPID URINE DRUG SCREEN, HOSP PERFORMED: Benzodiazepines: NOT DETECTED

## 2012-08-08 MED ORDER — SODIUM CHLORIDE 0.9 % IV SOLN
INTRAVENOUS | Status: DC
  Administered 2012-08-08: 20:00:00 via INTRAVENOUS

## 2012-08-08 MED ORDER — INSULIN ASPART 100 UNIT/ML ~~LOC~~ SOLN
14.0000 [IU] | Freq: Once | SUBCUTANEOUS | Status: AC
  Administered 2012-08-08: 14 [IU] via SUBCUTANEOUS

## 2012-08-08 MED ORDER — SODIUM CHLORIDE 0.9 % IV BOLUS (SEPSIS)
500.0000 mL | Freq: Once | INTRAVENOUS | Status: AC
  Administered 2012-08-08: 500 mL via INTRAVENOUS

## 2012-08-08 MED ORDER — FLUDROCORTISONE ACETATE 0.1 MG PO TABS
0.1000 mg | ORAL_TABLET | Freq: Every day | ORAL | Status: DC
  Administered 2012-08-08 – 2012-08-09 (×2): 0.1 mg via ORAL
  Filled 2012-08-08 (×2): qty 1

## 2012-08-08 MED ORDER — INSULIN GLARGINE 100 UNIT/ML ~~LOC~~ SOLN
40.0000 [IU] | Freq: Every day | SUBCUTANEOUS | Status: DC
  Administered 2012-08-08: 40 [IU] via SUBCUTANEOUS
  Filled 2012-08-08 (×2): qty 0.4

## 2012-08-08 MED ORDER — INSULIN ASPART 100 UNIT/ML ~~LOC~~ SOLN
5.0000 [IU] | Freq: Three times a day (TID) | SUBCUTANEOUS | Status: DC
  Administered 2012-08-09: 5 [IU] via SUBCUTANEOUS

## 2012-08-08 NOTE — Progress Notes (Signed)
Patient's blood sugar 408 at this time.  Dr. Broadus John aware.  Will give patient 14 units of novalog insulin.  Will continue to monitor.

## 2012-08-08 NOTE — Progress Notes (Signed)
Clinical Social Work Department BRIEF PSYCHOSOCIAL ASSESSMENT 08/08/2012  Patient:  Amy [name], Amy [name]     Account Number:  [id number]     Admit date:  08/06/2012  Clinical Social Worker:  Earlie Server  Date/Time:  08/08/2012 09:00 AM  Referred by:  Physician  Date Referred:  08/08/2012 Referred for  SNF Placement   Other Referral:   Interview type:  Patient Other interview type:    PSYCHOSOCIAL DATA Living Status:  ALONE Admitted from facility:   Level of care:   Primary support name:  [name] Primary support relationship to patient:  SIBLING Degree of support available:   Strong    CURRENT CONCERNS Current Concerns  Post-Acute Placement   Other Concerns:    SOCIAL WORK ASSESSMENT / PLAN CSW received referral to assist with dc planning. CSW reviewed chart which stated that PT recommending [facility] vs SNF. CSW met with patient at bedside.    CSW introduced myself and explained role. Patient reports that PTA she was living at home alone and has aides that assist her throughout the week. Patient reports that friends and family also support her. CSW spoke with patient regarding PT recommendations for [facility] vs SNF. Patient politely refuses SNF and reports she is "too young to go to a nursing home." Patient reports that she feels she can manage at home but wants to talk with CM regarding [facility] and getting additional assistance at home. CSW made referral to CM.    CSW spoke with patient regarding ALF placement. Patient reports that she might consider ALF in the future. CSW explained benefits via Medicaid and Medicare for SNF and ALF placement. Patient reports she is unsure about ALF placement but agreeable to accept list. Patient reports she will talk about ALF with friends and family but does not want to be placed at this time.    CSW is signing off but available if further needs arise.   Assessment/plan status:  No Further Intervention Required Other assessment/ plan:   Information/referral  to community resources:   ALF list  SNF information    PATIENT'S/FAMILY'S RESPONSE TO PLAN OF CARE: Patient alert and oriented. Patient reports that she plans to return home with assistance from aides. Patient interested in talking with CM regarding HH. Patient thanked CSW for ALF list.

## 2012-08-08 NOTE — Progress Notes (Signed)
Physical Therapy Treatment Patient Details Name: Amy [name] MRN: [name] DOB: 1960/10/25 Today's Date: 08/08/2012 Time: [id number] PT Time Calculation (min): 28 min  PT Assessment / Plan / Recommendation Comments on Treatment Session  Pt is legally blind and uses a pointer.  Pt very willing to amb.  First, I took BP's: supine 114/75, EOB 106/73, standing 106/73, after amb 25' 95/67.  Pt had one staggered LOB in which therapist recovered.  Pt c/o dizzyness and stated she could "see spots".    Follow Up Recommendations  Home health PT;Supervision for mobility/OOB;SNF     Does the patient have the potential to tolerate intense rehabilitation     Barriers to Discharge        Equipment Recommendations  None recommended by PT    Recommendations for Other Services    Frequency Min 3X/week   Plan      Precautions / Restrictions Precautions Precautions: Fall Precaution Comments: Hx dizzy Restrictions Weight Bearing Restrictions: No   Pertinent Vitals/Pain No c/o pain    Mobility  Bed Mobility Bed Mobility: Supine to Sit;Sit to Supine Supine to Sit: 5: Supervision Sit to Supine: 5: Supervision Details for Bed Mobility Assistance: increased time and cueing for direction due to decreased vision Transfers Transfers: Sit to Stand;Stand to Sit Sit to Stand: 4: Min guard;4: Min assist;From bed Stand to Sit: 4: Min guard;4: Min assist;To bed Details for Transfer Assistance: VCS safety, technique, hand placement. Assist to rise, stabilize. Guiding assistance to allow pt to safely sit.  Ambulation/Gait Ambulation/Gait Assistance: 4: Min assist Ambulation Distance (Feet): 5 Feet Assistive device: None Ambulation/Gait Assistance Details: Pt used her walking stick during amb along with VC's from therapist on direction to negociate around the room and amb in the hallway. After 25' pt was c/o mild dizzyness and had a large staggered LOB in which the therapist recovered using gait belt.   Pt stated she couls see spots and felt dizzy. Gait Pattern: Decreased stride length;Decreased step length - right;Decreased step length - left;Step-through pattern Gait velocity: increased time due to decreased vision/unfamiliar surroundings    PT Goals                                                   progressing    Visit Information  Last PT Received On: 08/08/12 Assistance Needed: +1    Subjective Data  Subjective: I don't know what's wrong Patient Stated Goal: home   Cognition    good   Balance   poor  End of Session PT - End of Session Equipment Utilized During Treatment: Gait belt Activity Tolerance: Treatment limited secondary to medication (hypotension and dizzyness) Patient left: in bed;with call bell/phone within reach   Rica Koyanagi  PTA [facility]  Acute  Rehab Pager      [phone]

## 2012-08-08 NOTE — Progress Notes (Addendum)
[facility] PROGRESS NOTE  Amy [name] [id number] DOB: August 02, 1960 DOA: 08/06/2012 PCP: [name], MD  Assessment/Plan: 1. Orthostatic hypotension/ Dizziness: - MRI of head obtained which reported no acute stroke or definite acute intracranial abnormality - suspect induced by autonomic neuropathy from long standing uncontrolled DM - IVF today, add florinef low dose today - stop ACE - caution about staying well hydrated and lifestyle modification, compression stocking  2. DM: uncontrolled - Place on SSI - Diabetic diet - continue Lantus, increase dose, add meal coverage  3. Hypernatremia - resolved - patient reportedly eating and drinking well  Code Status: full Family Communication: no family at bedside.  Disposition Plan: home, adamantly declines SNF   Consultants:  None  Procedures:  none  Antibiotics:  none  HPI/Subjective: Patient reports feeling better, BP dropped this am on standing again.  Objective: Filed Vitals:   08/08/12 1042 08/08/12 1045 08/08/12 1350 08/08/12 1404  BP: 106/73 95/67 89/63  92/62  Pulse:   77   Temp:   98.4 F (36.9 C)   TempSrc:   Oral   Resp:   18   Height:      Weight:      SpO2:   94%     Intake/Output Summary (Last 24 hours) at 08/08/12 1438 Last data filed at 08/08/12 0853  Gross per 24 hour  Intake    240 ml  Output    400 ml  Net   -160 ml   Filed Weights   08/07/12 0510  Weight: 81.4 kg (179 lb 7.3 oz)    Exam:   General:  Pt in NAD, Alert and Awake  Cardiovascular: RRR, No rubs  Respiratory: CTA BL, no wheezes  Abdomen: soft, NT, ND  Musculoskeletal: no clubbing   Data Reviewed: Basic Metabolic Panel:  Recent Labs Lab 08/02/12 1220 08/06/12 1026 08/08/12 0530  NA 142 146* 139  K 3.8 4.3 4.5  CL 104 106 106  CO2 28 28 26   GLUCOSE 119* 162* 199*  BUN 25* 19 15  CREATININE 1.43* 1.15* 0.85  CALCIUM 10.4 9.9 9.5   Liver Function Tests:  Recent Labs Lab 08/02/12 1220   AST 13  ALT 11  ALKPHOS 113  BILITOT 0.2*  PROT 7.7  ALBUMIN 3.2*   No results found for this basename: LIPASE, AMYLASE,  in the last 168 hours No results found for this basename: AMMONIA,  in the last 168 hours CBC:  Recent Labs Lab 08/02/12 1220 08/06/12 1026  WBC 9.4 10.2  HGB 14.6 14.8  HCT 42.3 43.7  MCV 77.0* 77.1*  PLT 213 193   Cardiac Enzymes: No results found for this basename: CKTOTAL, CKMB, CKMBINDEX, TROPONINI,  in the last 168 hours BNP (last 3 results)  Recent Labs  08/13/11 1730  PROBNP 158.3*   CBG:  Recent Labs Lab 08/07/12 1121 08/07/12 1702 08/07/12 2056 08/08/12 0740 08/08/12 1147  GLUCAP 334* 123* 266* 221* 408*    No results found for this or any previous visit (from the past 240 hour(s)).   Studies: No results found.  Scheduled Meds: . fludrocortisone  0.1 mg Oral Daily  . heparin  5,000 Units Subcutaneous Q8H  . insulin aspart  0-5 Units Subcutaneous QHS  . insulin aspart  0-9 Units Subcutaneous TID WC  . insulin aspart  5 Units Subcutaneous TID WC  . insulin glargine  40 Units Subcutaneous QHS  . levothyroxine  137 mcg Oral QAC breakfast  . montelukast  10 mg Oral QHS  .  pantoprazole  40 mg Oral q morning - 10a  . potassium chloride SA  20 mEq Oral q morning - 10a  . pregabalin  50 mg Oral BID  . simvastatin  20 mg Oral q1800  . vitamin B-12  1,000 mcg Oral q morning - 10a   Continuous Infusions:   Principal Problem:   Dizziness Active Problems:   Blindness   DM (diabetes mellitus)   Orthostatic hypotension   Hypernatremia    Time spent: > 35 minutes    [facility]  [facility] Pager [phone]. If 7PM-7AM, please contact night-coverage at [email or web address], password [facility] 08/08/2012, 2:38 PM  LOS: 2 days

## 2012-08-08 NOTE — Progress Notes (Signed)
Patient's blood pressure 92/62, patient feeling lightheaded.  Dr. Broadus John aware.  Patient given 500cc NSS bolus.  Repeat blood pressure post bolus was 104/62.  Patient states she feels better.  Will monitor.

## 2012-08-09 LAB — GLUCOSE, CAPILLARY: Glucose-Capillary: 100 mg/dL — ABNORMAL HIGH (ref 70–99)

## 2012-08-09 LAB — BASIC METABOLIC PANEL
Chloride: 109 mEq/L (ref 96–112)
GFR calc Af Amer: 82 mL/min — ABNORMAL LOW (ref >90)
Potassium: 4.1 mEq/L (ref 3.5–5.1)

## 2012-08-09 MED ORDER — INSULIN ASPART 100 UNIT/ML ~~LOC~~ SOLN: 3.0000 [IU] | Freq: Three times a day (TID) | SUBCUTANEOUS | Status: DC

## 2012-08-09 MED ORDER — FLUDROCORTISONE ACETATE 0.1 MG PO TABS: 0.1000 mg | ORAL_TABLET | Freq: Every day | ORAL | Status: DC

## 2012-08-09 MED ORDER — INSULIN ASPART 100 UNIT/ML ~~LOC~~ SOLN: 5.0000 [IU] | Freq: Three times a day (TID) | SUBCUTANEOUS | Status: DC

## 2012-08-09 NOTE — Progress Notes (Signed)
Nutrition Education Note  RD consulted for nutrition education regarding diabetes.   Lab Results  Component Value Date   HGBA1C 12.3* 08/06/2012    RD provided "Carbohydrate Counting for People with Diabetes" handout from the Academy of [employer] Dietetics. Discussed different food groups and their effects on blood sugar, emphasizing carbohydrate-containing foods. Provided list of carbohydrates and recommended serving sizes of common foods.  Pt blind. Pt states her former aid provided her with large portion sizes of carbohydrates which pt did not want and thinks that attributed to her elevated HbA1c. Per pt's typical dietary recall, her intake of carbohydrate containing foods is WNL. Pt states she recently hired a new aid and is hopefully that she can cook her foods within the diabetic diet restrictions. Pt states she will give her new aid the diabetic diet handouts to read over.   Discussed importance of controlled and consistent carbohydrate intake throughout the day. Provided examples of ways to balance meals/snacks and encouraged intake of high-fiber, whole grain complex carbohydrates. Teach back method used.  Expect good compliance.  Body mass index is 36.23 kg/(m^2). Pt meets criteria for class II obesity based on current BMI.  Current diet order is CHO modified, patient is consuming approximately 100% of meals at this time. Labs and medications reviewed. No further nutrition interventions warranted at this time. RD contact information provided. If additional nutrition issues arise, please re-consult RD.   Mikey College MS, [place], [place] Pager [phone] After Hours Pager

## 2012-08-09 NOTE — Progress Notes (Signed)
Clinical Social Work  Patient was discussed during progression meeting and RN reports that supportive friend's brother passed away and patient will have decreased support at home. RN requests CSW speak with patient again regarding SNF placement.  CSW met with patient at bedside. Patient reports that her friend is going to be with her family for a few days but patient has already contacted another friend to assist during this time. Patient refuses SNF and reports that she wants to be back home where she is familiar with surroundings. Patient reports that she will be safe when she returns home and does not have any concerns regarding her safety.  Patient and CSW spoke about home life and supports in place. Patient has Life Alert and is going to inquire if [employer] will let her sister move in to assist with needs.   CSW informed RN and MD of patient's decision. CSW is signing off but available if needed.  [place], [place] [phone]

## 2012-08-09 NOTE — Progress Notes (Signed)
Patient discharged to home.  Patient is awkae alert and oriented x3, patient is blind.  Reviewed discharge instructions with patient.  Patient without questions at this time.  Arranged visiting RN and PT OT for patient at home.  Patient's friend picking her up and taking her home.  Patient escorted to lobby via wheelchair.  Patient discharged.

## 2012-08-09 NOTE — Discharge Summary (Signed)
Physician Discharge Summary  Amy [name] [id number] DOB: 29-Sep-1960 DOA: 08/06/2012  PCP: Amy Peers, MD  Admit date: 08/06/2012 Discharge date: 08/09/2012  Time spent: 45 minutes  Recommendations for Outpatient Follow-up:  1. PCP in 1 week 2. Bmet in 1 week 3. Home health PT/OT/RN/Aide and Education officer, museum  Discharge Diagnoses:    Orthostatic Hypotension   Suspected autonomic neuropathy   Blindness   Uncontrolled DM (diabetes mellitus)   Hypernatremia   Peripheral neuropathy    Discharge Condition: stable  Diet recommendation: regular  Filed Weights   08/07/12 0510  Weight: 81.4 kg (179 lb 7.3 oz)    History of present illness:  HPI: Amy [name] is a 52 y.o. female with long standing DM and complications including blindness, neuropathy and gastroparesis.  She was seen in the ER a few days ago with the same complaints. She has dizziness, lightheadedness when standing. She was then found to have orthostatic Hypotension- given IVF and d/c'd home. Today patient having same complaints along with word finding difficulties and slurred speech per caregiver (patient is blind). Her BP at home was 86/69.  In the ER, patient was still found to be orthostatic- IVF wil be given   Hospital Course:  . Orthostatic hypotension/ Dizziness:  - MRI of head obtained which reported no acute stroke or definite acute intracranial abnormality  - suspect induced by autonomic neuropathy from long standing uncontrolled DM  -  Treated with IVF initially and then  florinef low dose added  - stopped ACE  - cautioned pt  about staying well hydrated and lifestyle modification, compression stocking  -clinically improved and orthostatics better at DC  2. DM uncontrolled - Poor long term control at home, Hbaic was 12.4: -difficult situation with blindness and inability to read instruction in braille as well -continued on lantus and her home regimen of novolog was complicated as she took [id number]  units with meals twice a day -discussed with DM coordinator and felt that novolog of 5units TID AC was felt to be appropriate along with lantus 30units SQ QHS -she was a talking glucometer and uses a lantus and novolog pen that clicks  3. Hypernatremia: -resolved, likely from poor PO intake  4. Blindness   Discharge Exam: Filed Vitals:   08/08/12 2010 08/08/12 2107 08/09/12 0200 08/09/12 0600  BP: 129/81 115/75 144/76 133/77  Pulse:  71 75 72  Temp:  98.2 F (36.8 C) 98.4 F (36.9 C) 98.7 F (37.1 C)  TempSrc:  Oral Oral Oral  Resp:  18 18 18   Height:      Weight:      SpO2:  96% 97% 99%    General: AAOx3 Cardiovascular: S1S2/RRR Respiratory: CTAB  Discharge Instructions  Discharge Orders   Future Orders Complete By Expires     Diet Carb Modified  As directed     Increase activity slowly  As directed         Medication List    STOP taking these medications       ibuprofen 200 MG tablet  Commonly known as:  ADVIL,MOTRIN     ramipril 5 MG capsule  Commonly known as:  ALTACE      TAKE these medications       albuterol 108 (90 BASE) MCG/ACT inhaler  Commonly known as:  PROVENTIL HFA;VENTOLIN HFA  Inhale 2 puffs into the lungs every 4 (four) hours as needed. For shortness of breath     fludrocortisone 0.1 MG tablet  Commonly known  as:  FLORINEF  Take 1 tablet (0.1 mg total) by mouth daily.     ICY HOT EX  Apply 1 application topically at bedtime. Apply to back     insulin aspart 100 UNIT/ML injection  Commonly known as:  novoLOG  Inject 5 Units into the skin 3 (three) times daily before meals. DO Not take this if CBG <120     insulin glargine 100 UNIT/ML injection  Commonly known as:  LANTUS  Inject 30 Units into the skin at bedtime.     levothyroxine 137 MCG tablet  Commonly known as:  SYNTHROID, LEVOTHROID  Take 137 mcg by mouth every morning.     lidocaine 5 %  Commonly known as:  LIDODERM  Place 1 patch onto the skin as needed (as needed for  back pain). Remove & Discard patch within 12 hours or as directed by MD     montelukast 10 MG tablet  Commonly known as:  SINGULAIR  Take 10 mg by mouth at bedtime.     pantoprazole 40 MG tablet  Commonly known as:  PROTONIX  Take 40 mg by mouth every morning.     potassium chloride SA 20 MEQ tablet  Commonly known as:  K-DUR,KLOR-CON  Take 20 mEq by mouth every morning.     pregabalin 50 MG capsule  Commonly known as:  LYRICA  Take 50 mg by mouth 2 (two) times daily.     simvastatin 20 MG tablet  Commonly known as:  ZOCOR  Take 20 mg by mouth every evening.     vitamin B-12 1000 MCG tablet  Commonly known as:  CYANOCOBALAMIN  Take 1,000 mcg by mouth every morning.           Follow-up Information   Follow up with Amy Peers, MD In 1 week.   Contact information:   Amy [name] [place] [address] [phone]        The results of significant diagnostics from this hospitalization (including imaging, microbiology, ancillary and laboratory) are listed below for reference.    Significant Diagnostic Studies: Ct Head Wo Contrast  08/06/2012  *RADIOLOGY REPORT*  Clinical Data: Dysphagia.  Slurred speech.  Headache  CT HEAD WITHOUT CONTRAST  Technique:  Contiguous axial images were obtained from the base of the skull through the vertex without contrast.  Comparison: 10/20/2008  Findings: There is no evidence for acute hemorrhage, hydrocephalus, mass lesion, or abnormal extra-axial fluid collection.  No definite CT evidence for acute infarction.  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  The visualized paranasal sinuses and mastoid air cells are clear.  IMPRESSION: Stable exam.  No acute intracranial abnormality.   Original Report Authenticated By: [name], M.D.    Mr Brain Wo Contrast  08/06/2012  *RADIOLOGY REPORT*  Clinical Data: 52 year old female with dizziness, slurred speech, difficulty walking times  several days.  MRI HEAD WITHOUT CONTRAST  Technique:  Multiplanar, multiecho pulse sequences of the brain and surrounding structures were obtained according to standard protocol without intravenous contrast.  Comparison: Head CT without contrast 08/06/2012 and earlier.  Findings: No restricted diffusion to suggest acute infarction.  No midline shift, mass effect, evidence of mass lesion, ventriculomegaly, extra-axial collection or acute intracranial hemorrhage.  Cervicomedullary junction and pituitary are within normal limits.  Major intracranial vascular flow voids are preserved. Cerebral volume is within normal limits for age. Visualized cervical spine remarkable for C3-C4 and C4-C5 disc and  endplate degeneration.  Confluent T2 and FLAIR hyperintensity in the pons (series 5 image 9).  No pontine expansion or mass effect.  Other brain stem structures are within normal limits.  Deep gray matter nuclei are within normal limits.  There is possibly a tiny chronic lacunar infarct in the right cerebellar hemisphere (image 8).  Cerebral white matter T2 and FLAIR hyperintensity is within normal limits for age.  No cortical encephalomalacia.  Normal bone marrow signal.  Small left maxillary sinus mucous retention cyst.  Mastoids are clear.  Grossly normal bilateral internal auditory structures.  Negative scalp soft tissues. 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  IMPRESSION: 1.  No acute stroke or definite acute intracranial abnormality. 2.  Confluent signal abnormality in the pons is nonspecific and can be due simply to ordinary chronic small vessel disease, but consider also osmotic demyelination in the appropriate clinical setting. 3.  Supratentorial brain within normal limits for age.  Possible tiny chronic lacunar infarct in the right cerebellum. 4.  Chronic changes to the orbits.   Original Report Authenticated By: [name], M.D.     Microbiology: No results found for this or any  previous visit (from the past 240 hour(s)).   Labs: Basic Metabolic Panel:  Recent Labs Lab 08/06/12 1026 08/08/12 0530 08/09/12 0448  NA 146* 139 141  K 4.3 4.5 4.1  CL 106 106 109  CO2 28 26 25   GLUCOSE 162* 199* 215*  BUN 19 15 16   CREATININE 1.15* 0.85 0.91  CALCIUM 9.9 9.5 9.1   Liver Function Tests: No results found for this basename: AST, ALT, ALKPHOS, BILITOT, PROT, ALBUMIN,  in the last 168 hours No results found for this basename: LIPASE, AMYLASE,  in the last 168 hours No results found for this basename: AMMONIA,  in the last 168 hours CBC:  Recent Labs Lab 08/06/12 1026  WBC 10.2  HGB 14.8  HCT 43.7  MCV 77.1*  PLT 193   Cardiac Enzymes: No results found for this basename: CKTOTAL, CKMB, CKMBINDEX, TROPONINI,  in the last 168 hours BNP: BNP (last 3 results)  Recent Labs  08/13/11 1730  PROBNP 158.3*   CBG:  Recent Labs Lab 08/08/12 1147 08/08/12 1706 08/08/12 2015 08/08/12 2112 08/09/12 0755  GLUCAP 408* 89 215* 268* 100*       Signed:  Valarie Farace  [facility] 08/09/2012, 1:19 PM

## 2012-08-09 NOTE — Progress Notes (Signed)
Spoke with pt at bedside about [facility] services. She picked [facility] as the provider. Pt asked about CAP services. I advised her to speak with her Medicaid case worker about eligibility. MD has also order [facility] social worker to help with this. [facility] has been made aware of the referral.

## 2012-08-09 NOTE — Progress Notes (Signed)
Inpatient Diabetes Program Recommendations  AACE/ADA: New Consensus Statement on Inpatient Glycemic Control (2013)  Target Ranges:  Prepandial:   less than 140 mg/dL      Peak postprandial:   less than 180 mg/dL (1-2 hours)      Critically ill patients:  140 - 180 mg/dL   Reason for Visit: Note history of Type 1 Diabetes.  Discussed with Dr. Broadus John.  Patient uses insulin pens at home and listens for the click (both Novolog/Lantus).  Her new insulin regimen will be Lantus 30 units at bedtime and Novolog 5 units with each meal.  Discussed this regimen with patient and she was able to "teach back".  She also wanted it written down and said she had people to help her read it.  She states that she distinguishes between the pens by feeling the bottom of the pen (Lantus bumpy/Novolog smooth).  She also has a talking meter to check her CBG's.  Seems comfortable with regimen and knows to follow-up with Dr. Criss Rosales.  She will also have Home health.

## 2012-09-18 ENCOUNTER — Encounter (HOSPITAL_COMMUNITY): Payer: Self-pay | Admitting: Emergency Medicine

## 2012-09-18 ENCOUNTER — Emergency Department (INDEPENDENT_AMBULATORY_CARE_PROVIDER_SITE_OTHER)
Admission: EM | Admit: 2012-09-18 | Discharge: 2012-09-18 | Disposition: A | Discharge status: Home / Self Care | Payer: PRIVATE HEALTH INSURANCE | Source: Home / Self Care

## 2012-09-18 DIAGNOSIS — E1142 Type 2 diabetes mellitus with diabetic polyneuropathy: Secondary | ICD-10-CM

## 2012-09-18 DIAGNOSIS — E1149 Type 2 diabetes mellitus with other diabetic neurological complication: Secondary | ICD-10-CM

## 2012-09-18 DIAGNOSIS — IMO0001 Reserved for inherently not codable concepts without codable children: Secondary | ICD-10-CM

## 2012-09-18 DIAGNOSIS — M609 Myositis, unspecified: Secondary | ICD-10-CM

## 2012-09-18 MED ORDER — TRAMADOL HCL 50 MG PO TABS: 50.0000 mg | ORAL_TABLET | Freq: Four times a day (QID) | ORAL | Status: DC | PRN

## 2012-09-18 NOTE — ED Notes (Signed)
Pt c/o right sided pain that radiates from neck all the way down the her legs. Has leg swelling and unsteady gait. Has knot on neck. No injury. Has spinal stenosis and is generally treated by her PCP. Generalized weakness. Feels sharp pain shooting on right side. Patient is alert and oriented.

## 2012-09-18 NOTE — ED Provider Notes (Signed)
CSN: [id number]  Arrival date & time 09/18/12  1126   None     Chief Complaint  Patient presents with  . Generalized Body Aches    (Consider location/radiation/quality/duration/timing/severity/associated sxs/prior treatment) HPI Comments: 52 year old female who appears 8 years older than her stated age and is complaining of acute on chronic pain in the muscles of the upper back and right upper arm. She is also hurting "all my right side. Specifically she points to the medial aspect of her right thigh. She states she has spurs on her spine and believes this may have something to do with her symptoms. These myalgias have been occurring for several years however it was worse today. She did not call her doctor but calls she was in too much pain. She also states she has a problem with balance. This began a little over a month ago. She reports her blood pressure was low and was hospitalized. She states that way she stands too quickly she will become dizzy and has to sit back down. This is not an acute problem. She states the only reason she came to the [facility] today was to get medicine for her pain.  She has a long medical history of acute and chronic conditions including asthma, type 2 diabetes, hypertension, blindness, hypothyroidism, hyperlipidemia, diabetic neuropathy and a former smoker.   Past Medical History  Diagnosis Date  . Asthma   . Diabetes mellitus   . Hypertension   . Blind   . Hypothyroidism   . Hyperlipidemia   . Diabetic neuropathy   . Blindness and low vision     left eye glass eye,  legally blind in right eye    Past Surgical History  Procedure Laterality Date  . Cesarean section      x 2  . Abdominal hysterectomy    . Cholecystectomy    . Enucleation      Family History  Problem Relation Age of Onset  . Diabetes Sister     History  Substance Use Topics  . Smoking status: Former Smoker    Quit date: 05/22/1992  . Smokeless tobacco: Never Used  .  Alcohol Use: No    OB History   Grav Para Term Preterm Abortions TAB SAB Ect Mult Living                 Obstetric Comments   Pt has two sons.      Review of Systems  Constitutional: Positive for activity change. Negative for fever.  HENT: Positive for neck pain and neck stiffness. Negative for ear pain, congestion, sore throat, rhinorrhea, trouble swallowing, sinus pressure and ear discharge.   Eyes: Positive for visual disturbance.  Respiratory: Negative.   Cardiovascular: Negative.   Gastrointestinal: Negative.   Musculoskeletal: Positive for myalgias and back pain.  Skin: Negative.   Neurological: Positive for weakness. Negative for tremors and speech difficulty.    Allergies  Codeine; Iohexol; Penicillins; and Sulfa antibiotics  Home Medications   Current Outpatient Rx  Name  Route  Sig  Dispense  Refill  . insulin aspart (NOVOLOG) 100 UNIT/ML injection   Subcutaneous   Inject 5 Units into the skin 3 (three) times daily before meals. DO Not take this if CBG <120   1 vial   0   . insulin glargine (LANTUS) 100 UNIT/ML injection   Subcutaneous   Inject 30 Units into the skin at bedtime.          Marland Kitchen levothyroxine (SYNTHROID,  LEVOTHROID) 137 MCG tablet   Oral   Take 137 mcg by mouth every morning.          . montelukast (SINGULAIR) 10 MG tablet   Oral   Take 10 mg by mouth at bedtime.           . pantoprazole (PROTONIX) 40 MG tablet   Oral   Take 40 mg by mouth every morning.          . potassium chloride SA (K-DUR,KLOR-CON) 20 MEQ tablet   Oral   Take 20 mEq by mouth every morning.          . pregabalin (LYRICA) 50 MG capsule   Oral   Take 50 mg by mouth 2 (two) times daily.          . simvastatin (ZOCOR) 20 MG tablet   Oral   Take 20 mg by mouth every evening.           . vitamin B-12 (CYANOCOBALAMIN) 1000 MCG tablet   Oral   Take 1,000 mcg by mouth every morning.          Marland Kitchen albuterol (PROVENTIL HFA;VENTOLIN HFA) 108 (90 BASE)  MCG/ACT inhaler   Inhalation   Inhale 2 puffs into the lungs every 4 (four) hours as needed. For shortness of breath         . fludrocortisone (FLORINEF) 0.1 MG tablet   Oral   Take 1 tablet (0.1 mg total) by mouth daily.   30 tablet   0   . lidocaine (LIDODERM) 5 %   Transdermal   Place 1 patch onto the skin as needed (as needed for back pain). Remove & Discard patch within 12 hours or as directed by MD         . Menthol, Topical Analgesic, (ICY HOT EX)   Apply externally   Apply 1 application topically at bedtime. Apply to back         . traMADol (ULTRAM) 50 MG tablet   Oral   Take 1 tablet (50 mg total) by mouth every 6 (six) hours as needed for pain.   15 tablet   0     BP 131/70  Pulse 76  Temp(Src) 97.6 F (36.4 C) (Oral)  Resp 18  SpO2 97%  Physical Exam  Vitals reviewed. Constitutional: She is oriented to person, place, and time. She appears well-developed and well-nourished. No distress.  Eyes:  Blindness with prosthetic eye.  Neck:  Pain and tenderness bilateral superior and posterior shoulders and musculature across the upper back. Tenderness in the deltoid and soft tissues of the right medial thigh.  Cardiovascular: Normal rate, regular rhythm and normal heart sounds.   Pulmonary/Chest: Effort normal and breath sounds normal.  Musculoskeletal: She exhibits tenderness. She exhibits no edema.  Lymphadenopathy:    She has no cervical adenopathy.  Neurological: She is alert and oriented to person, place, and time. She exhibits normal muscle tone.  Skin: Skin is warm and dry. No rash noted.  Psychiatric: She has a normal mood and affect.    ED Course  Procedures (including critical care time)  Labs Reviewed - No data to display No results found.   1. Myalgia and myositis   2. Myofasciitis   3. Diabetic peripheral neuropathy associated with type 2 diabetes mellitus       MDM  Tramadol 50 mg Q6 hours when necessary pain #15 Continue your  other medications. Followup with your PCP in 2 days call for an  appointment today.   Janne Napoleon, NP 09/18/12 1318  Janne Napoleon, NP 09/18/12 [place], NP 09/18/12 1324

## 2012-09-21 NOTE — ED Provider Notes (Signed)
Medical screening examination/treatment/procedure(s) were performed by resident physician or non-physician practitioner and as supervising physician I was immediately available for consultation/collaboration.   Pauline Good MD.   Billy Fischer, MD 09/21/12 [phone]

## 2012-10-09 ENCOUNTER — Ambulatory Visit: Payer: PRIVATE HEALTH INSURANCE

## 2012-10-15 ENCOUNTER — Ambulatory Visit: Payer: PRIVATE HEALTH INSURANCE

## 2012-10-18 IMAGING — CR DG ABDOMEN ACUTE W/ 1V CHEST
4 series · 4 of 4 positions shown · non-contrast
Comparison: 02/28/2010

CLINICAL DATA: Lower abdominal pain, vomiting, diarrhea

ACUTE ABDOMEN SERIES (ABDOMEN 2 VIEW & CHEST 1 VIEW)

[w chest pa]
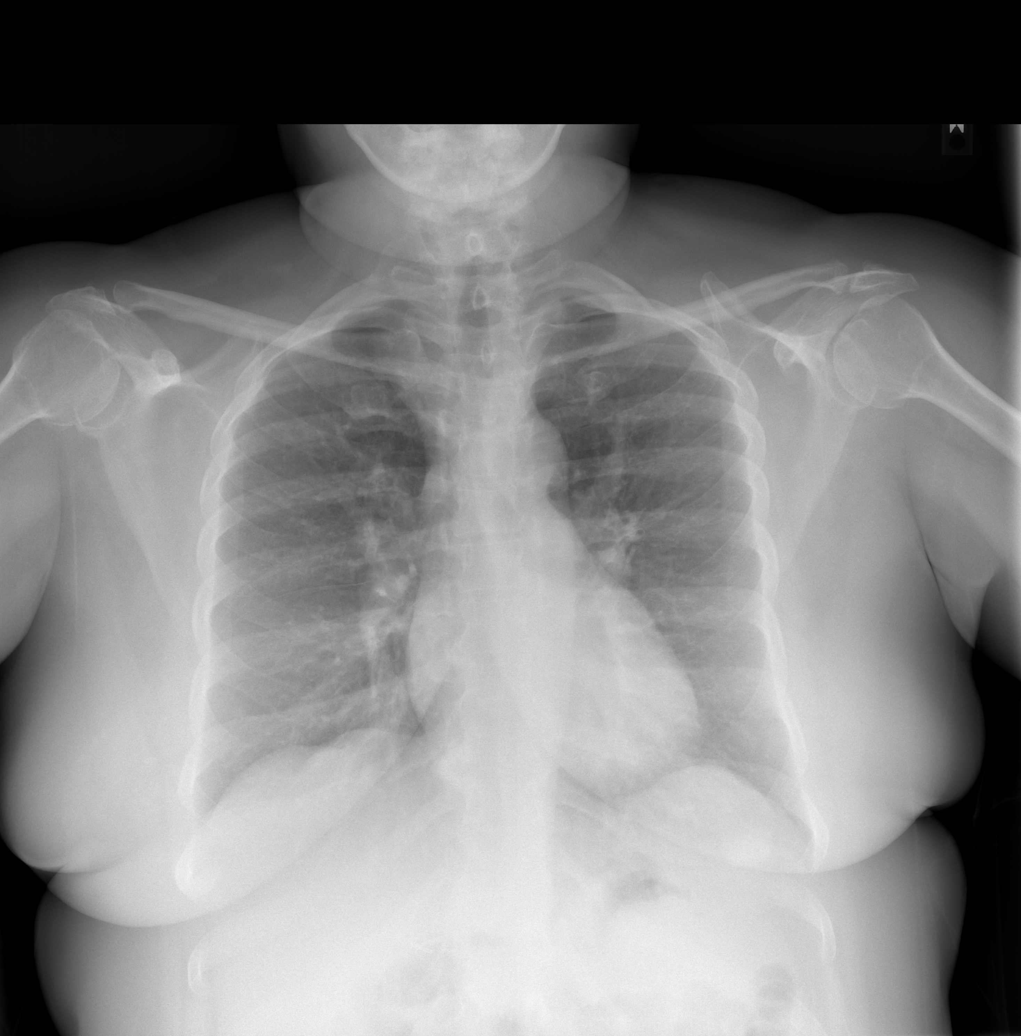

[w abdomen upright *]
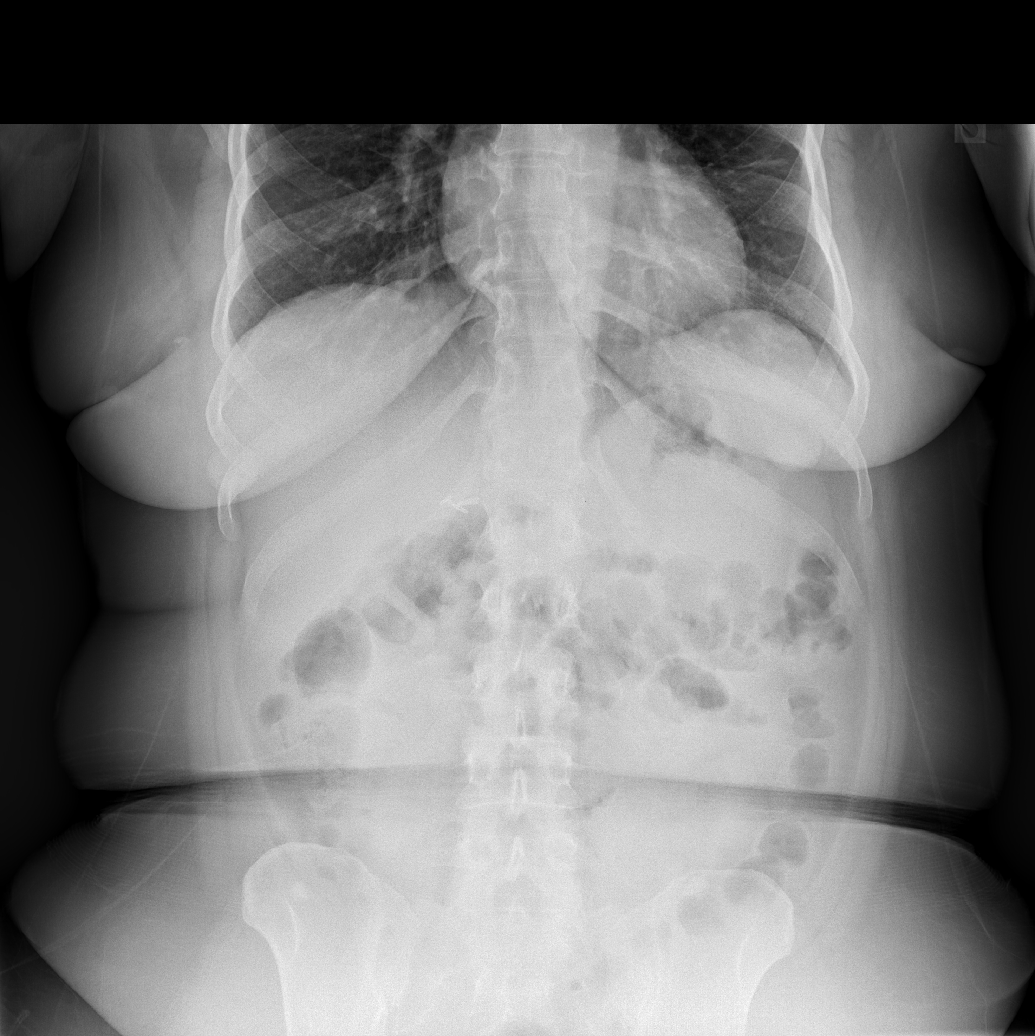

[t abdomen supine (1 of 2)]
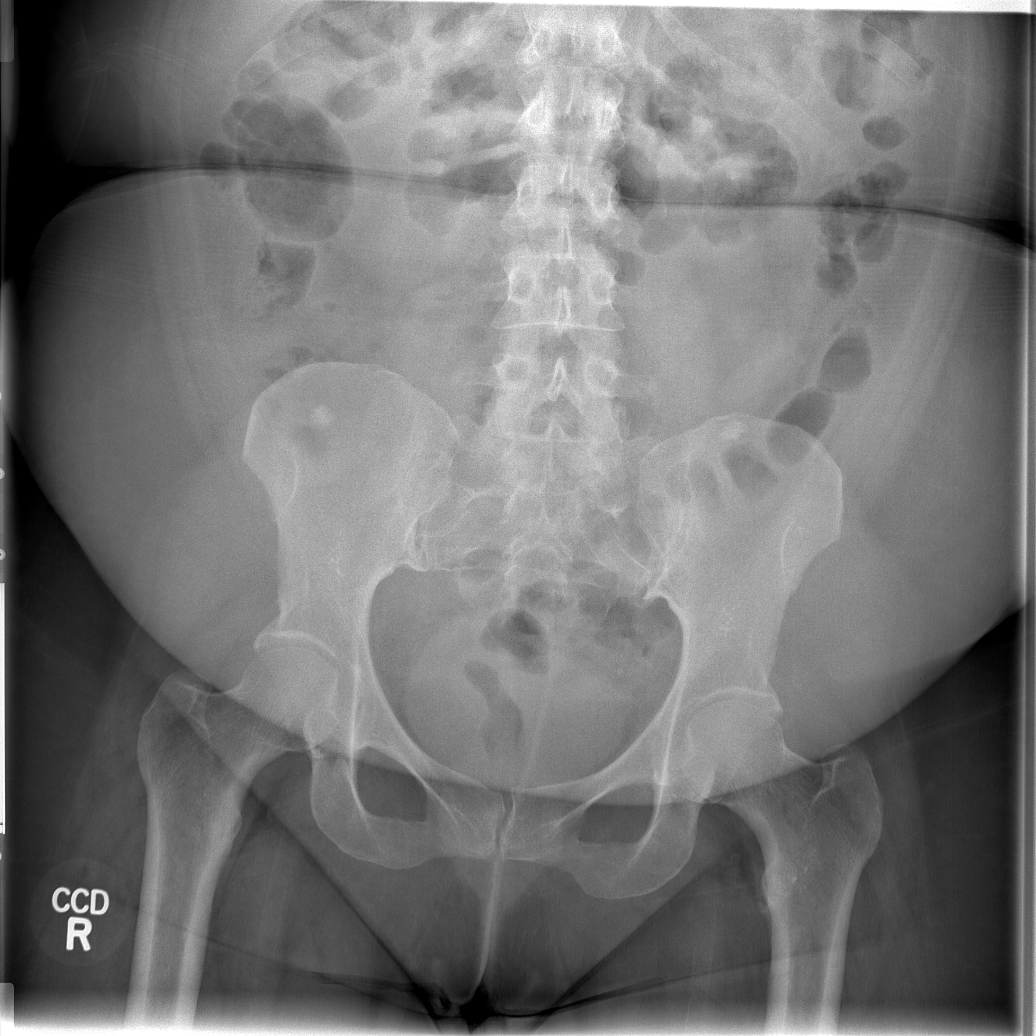

[t abdomen supine (2 of 2)]
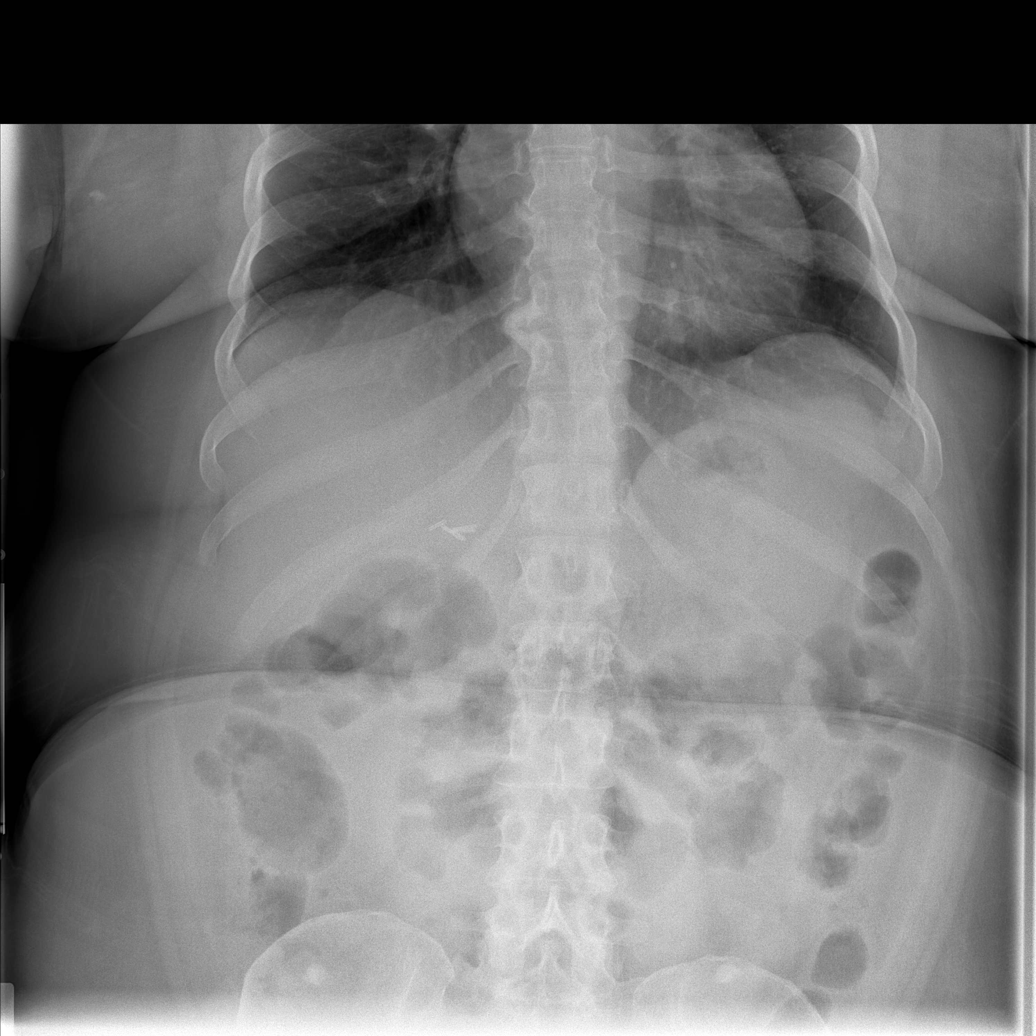

[4 of 4 positions shown; findings below may reference images not displayed]

FINDINGS: Upper normal-size of cardiac silhouette.
Mediastinal contours and pulmonary vascularity normal.
Minimal chronic peribronchial thickening.
Lungs clear.

Surgical clips right upper quadrant question cholecystectomy.
Nonobstructive bowel gas pattern.
Bone islands in iliac wings bilaterally, stable.
No bowel dilatation, bowel wall thickening, or free intraperitoneal
air.
No urinary tract calcification.
Small left pelvic phlebolith unchanged.
Remaining osseous structures unremarkable.
IMPRESSION: No acute abnormalities.

## 2012-10-22 ENCOUNTER — Ambulatory Visit: Payer: PRIVATE HEALTH INSURANCE | Attending: Family Medicine | Admitting: Internal Medicine

## 2012-10-22 VITALS — BP 122/81 | HR 67 | Temp 97.9°F | Resp 16 | Wt 180.0 lb

## 2012-10-22 DIAGNOSIS — I10 Essential (primary) hypertension: Secondary | ICD-10-CM

## 2012-10-22 DIAGNOSIS — E119 Type 2 diabetes mellitus without complications: Secondary | ICD-10-CM | POA: Insufficient documentation

## 2012-10-22 DIAGNOSIS — E039 Hypothyroidism, unspecified: Secondary | ICD-10-CM | POA: Insufficient documentation

## 2012-10-22 NOTE — Progress Notes (Signed)
Patient ID: Amy [name], female   DOB: Jan 04, 1961, 52 y.o.   MRN: [name]  CC: follow up  And establish care   HPI: Patient is 52 year old female with diabetes, hyperthyroidism, hyperlipidemia, presents to clinic for followup, would like to establish care. Patient also reports that she needs to have her regular blood work done today but is unsure of what exactly she needs to have it done. Patient denies chest pain or shortness of breath, no urinary or abdominal concerns. She reports checking her sugar levels regularly but does not have a logbook with her today. She expresses that her sugars have been around 200-250.  Allergies  Allergen Reactions  . Codeine     Gets sick  . Iohexol      Code: HIVES, Desc: pt developed itching and hives along with nasal congestion; needs 13 hour premeds for future studies, Onset Date: [id number]   . Penicillins     Gets sick  . Sulfa Antibiotics Nausea And Vomiting   Past Medical History  Diagnosis Date  . Asthma   . Diabetes mellitus   . Hypertension   . Blind   . Hypothyroidism   . Hyperlipidemia   . Diabetic neuropathy   . Blindness and low vision     left eye glass eye,  legally blind in right eye   Current Outpatient Prescriptions on File Prior to Visit  Medication Sig Dispense Refill  . albuterol (PROVENTIL HFA;VENTOLIN HFA) 108 (90 BASE) MCG/ACT inhaler Inhale 2 puffs into the lungs every 4 (four) hours as needed. For shortness of breath      . fludrocortisone (FLORINEF) 0.1 MG tablet Take 1 tablet (0.1 mg total) by mouth daily.  30 tablet  0  . insulin aspart (NOVOLOG) 100 UNIT/ML injection Inject 5 Units into the skin 3 (three) times daily before meals. DO Not take this if CBG <120  1 vial  0  . insulin glargine (LANTUS) 100 UNIT/ML injection Inject 30 Units into the skin at bedtime.       Marland Kitchen levothyroxine (SYNTHROID, LEVOTHROID) 137 MCG tablet Take 137 mcg by mouth every morning.       . lidocaine (LIDODERM) 5 % Place 1 patch onto the  skin as needed (as needed for back pain). Remove & Discard patch within 12 hours or as directed by MD      . Menthol, Topical Analgesic, (ICY HOT EX) Apply 1 application topically at bedtime. Apply to back      . montelukast (SINGULAIR) 10 MG tablet Take 10 mg by mouth at bedtime.        . pantoprazole (PROTONIX) 40 MG tablet Take 40 mg by mouth every morning.       . potassium chloride SA (K-DUR,KLOR-CON) 20 MEQ tablet Take 20 mEq by mouth every morning.       . pregabalin (LYRICA) 50 MG capsule Take 50 mg by mouth 2 (two) times daily.       . simvastatin (ZOCOR) 20 MG tablet Take 20 mg by mouth every evening.        . traMADol (ULTRAM) 50 MG tablet Take 1 tablet (50 mg total) by mouth every 6 (six) hours as needed for pain.  15 tablet  0  . vitamin B-12 (CYANOCOBALAMIN) 1000 MCG tablet Take 1,000 mcg by mouth every morning.        No current facility-administered medications on file prior to visit.   Family History  Problem Relation Age of Onset  . Diabetes Sister  History   Social History  . Marital Status: Single    Spouse Name: N/A    Number of Children: N/A  . Years of Education: N/A   Occupational History  . Not on file.   Social History Main Topics  . Smoking status: Former Smoker    Quit date: 05/22/1992  . Smokeless tobacco: Never Used  . Alcohol Use: No  . Drug Use: No  . Sexually Active: Not Currently   Other Topics Concern  . Not on file   Social History Narrative  . No narrative on file    Review of Systems  Constitutional: Negative for fever, chills, diaphoresis, activity change, appetite change and fatigue.  HENT: Negative for ear pain, nosebleeds, congestion, facial swelling, rhinorrhea, neck pain, neck stiffness and ear discharge.   Eyes: Negative for pain, discharge, redness, itching and visual disturbance.  Respiratory: Negative for cough, choking, chest tightness, shortness of breath, wheezing and stridor.   Cardiovascular: Negative for chest  pain, palpitations and leg swelling.  Gastrointestinal: Negative for abdominal distention.  Genitourinary: Negative for dysuria, urgency, frequency, hematuria, flank pain, decreased urine volume, difficulty urinating and dyspareunia.  Musculoskeletal: Negative for back pain, joint swelling, arthralgias and gait problem.  Neurological: Negative for dizziness, tremors, seizures, syncope, facial asymmetry, speech difficulty, weakness, light-headedness, numbness and headaches.  Hematological: Negative for adenopathy. Does not bruise/bleed easily.  Psychiatric/Behavioral: Negative for hallucinations, behavioral problems, confusion, dysphoric mood, decreased concentration and agitation.    Objective:   Filed Vitals:   10/22/12 1321  BP: 122/81  Pulse: 67  Temp: 97.9 F (36.6 C)  Resp: 16    Physical Exam  Constitutional: Appears well-developed and well-nourished. No distress.  CVS: RRR, S1/S2 +, no murmurs, no gallops, no carotid bruit.  Pulmonary: Effort and breath sounds normal, no stridor, rhonchi, wheezes, rales.  Abdominal: Soft. BS +,  no distension, tenderness, rebound or guarding.  Musculoskeletal: Normal range of motion. No edema and no tenderness.   Lab Results  Component Value Date   WBC 10.2 08/06/2012   HGB 14.8 08/06/2012   HCT 43.7 08/06/2012   MCV 77.1* 08/06/2012   PLT 193 08/06/2012   Lab Results  Component Value Date   CREATININE 0.91 08/09/2012   BUN 16 08/09/2012   NA 141 08/09/2012   K 4.1 08/09/2012   CL 109 08/09/2012   CO2 25 08/09/2012    Lab Results  Component Value Date   HGBA1C 12.3* 08/06/2012   Lipid Panel     Component Value Date/Time   CHOL 136 08/07/2012 0525   TRIG 90 08/07/2012 0525   HDL 42 08/07/2012 0525   CHOLHDL 3.2 08/07/2012 0525   VLDL 18 08/07/2012 0525   LDLCALC 76 08/07/2012 0525       Assessment and plan:   Patient Active Problem List   Diagnosis Date Noted  . DM (diabetes mellitus) - I advised patient to bring a logbook with  her to her next appointment so that we can review it and readjust her regimen as indicated. I have performed foot exam bilaterally, sensitivity on focal, no wounds noted on exam. We'll check A1c today. I advised patient to continue checking blood sugar levels regularly and to call us back if the numbers are persistently higher than 250.  08/13/2011  . Hypothyroidism - as and will in the morning and is in for a also a  05/23/2011

## 2012-10-22 NOTE — Progress Notes (Signed)
Patient here to establish care Sleeps with 02 at night History of DM

## 2012-10-22 NOTE — Patient Instructions (Signed)

## 2012-10-23 LAB — LIPID PANEL
HDL: 56 mg/dL (ref >39)
Total CHOL/HDL Ratio: 3 Ratio
VLDL: 22 mg/dL (ref 0–40)

## 2012-10-23 LAB — COMPREHENSIVE METABOLIC PANEL
AST: 14 U/L (ref 0–37)
Albumin: 3.7 g/dL (ref 3.5–5.2)
Alkaline Phosphatase: 106 U/L (ref 39–117)
Glucose, Bld: 136 mg/dL — ABNORMAL HIGH (ref 70–99)
Potassium: 4.7 mEq/L (ref 3.5–5.3)
Sodium: 132 mEq/L — ABNORMAL LOW (ref 135–145)
Total Bilirubin: 0.3 mg/dL (ref 0.3–1.2)
Total Protein: 7.1 g/dL (ref 6.0–8.3)

## 2012-10-23 LAB — HEMOGLOBIN A1C: Mean Plasma Glucose: 275 mg/dL — ABNORMAL HIGH (ref <117)

## 2012-10-29 NOTE — Progress Notes (Signed)
Quick Note:  Called pt at home to discuss elevated TSH, she has explained she has skipped few doses, medical compliance encouraged, also discussed elevated A1C and importance of medical compliance with insulin ______

## 2012-10-30 ENCOUNTER — Ambulatory Visit: Payer: PRIVATE HEALTH INSURANCE | Attending: Family Medicine | Admitting: Internal Medicine

## 2012-10-30 VITALS — BP 114/74 | HR 83 | Temp 98.6°F | Resp 18 | Ht 59.0 in | Wt 182.2 lb

## 2012-10-30 DIAGNOSIS — I951 Orthostatic hypotension: Secondary | ICD-10-CM

## 2012-10-30 DIAGNOSIS — E1142 Type 2 diabetes mellitus with diabetic polyneuropathy: Secondary | ICD-10-CM

## 2012-10-30 DIAGNOSIS — E039 Hypothyroidism, unspecified: Secondary | ICD-10-CM

## 2012-10-30 DIAGNOSIS — E119 Type 2 diabetes mellitus without complications: Secondary | ICD-10-CM

## 2012-10-30 DIAGNOSIS — E1149 Type 2 diabetes mellitus with other diabetic neurological complication: Secondary | ICD-10-CM

## 2012-10-30 DIAGNOSIS — E1140 Type 2 diabetes mellitus with diabetic neuropathy, unspecified: Secondary | ICD-10-CM

## 2012-10-30 DIAGNOSIS — IMO0001 Reserved for inherently not codable concepts without codable children: Secondary | ICD-10-CM | POA: Insufficient documentation

## 2012-10-30 MED ORDER — INSULIN GLARGINE 100 UNIT/ML ~~LOC~~ SOLN: 35.0000 [IU] | Freq: Every day | SUBCUTANEOUS | Status: DC

## 2012-10-30 MED ORDER — POTASSIUM CHLORIDE CRYS ER 20 MEQ PO TBCR: 20.0000 meq | EXTENDED_RELEASE_TABLET | Freq: Every morning | ORAL | Status: DC

## 2012-10-30 MED ORDER — SAXAGLIPTIN-METFORMIN ER 5-1000 MG PO TB24: 1.0000 | ORAL_TABLET | Freq: Every day | ORAL | Status: DC

## 2012-10-30 NOTE — Progress Notes (Signed)
Patient ID: LONETTA VOLK, female   DOB: 11-Feb-1961, 52 y.o.   MRN: [name] Patient Demographics  Tressa Jacobson, is a 52 y.o. female  [id number]  [id number]  DOB - 08-11-1960  Chief Complaint  Patient presents with  . Diabetes        Subjective:   Keionna Basciano today is here for a follow up visit. She has no major complaints. Her morning sugars reading that she has brought with her mostly in the 300s range. She has otherwise no complaints. She claims she is no longer taking the Florinef, she also is no longer taking her combination of metformin and saxagliptin.  Patient has No headache, No chest pain, No abdominal pain - No Nausea, No new weakness tingling or numbness, No Cough - SOB.   Objective:    Filed Vitals:   10/30/12 0915  BP: 114/74  Pulse: 83  Temp: 98.6 F (37 C)  TempSrc: Oral  Resp: 18  Height: 4\' 11"  (1.499 m)  Weight: 182 lb 4 oz (82.668 kg)  SpO2: 97%     ALLERGIES:   Allergies  Allergen Reactions  . Codeine     Gets sick  . Iohexol      Code: HIVES, Desc: pt developed itching and hives along with nasal congestion; needs 13 hour premeds for future studies, Onset Date: [id number]   . Penicillins     Gets sick  . Sulfa Antibiotics Nausea And Vomiting    PAST MEDICAL HISTORY: Past Medical History  Diagnosis Date  . Asthma   . Diabetes mellitus   . Hypertension   . Blind   . Hypothyroidism   . Hyperlipidemia   . Diabetic neuropathy   . Blindness and low vision     left eye glass eye,  legally blind in right eye    MEDICATIONS AT HOME: Prior to Admission medications   Medication Sig Start Date End Date Taking? Authorizing Provider  albuterol (PROVENTIL HFA;VENTOLIN HFA) 108 (90 BASE) MCG/ACT inhaler Inhale 2 puffs into the lungs every 4 (four) hours as needed. For shortness of breath   Yes Historical Provider, MD  glucose blood (PRODIGY AUTOCODE TEST) test strip 1 each by Other route as needed for other. Use as instructed   Yes  Historical Provider, MD  insulin aspart (NOVOLOG) 100 UNIT/ML injection Inject 5 Units into the skin 3 (three) times daily before meals. DO Not take this if CBG <120 08/09/12  Yes Domenic Polite, MD  insulin glargine (LANTUS) 100 UNIT/ML injection Inject 0.35 mLs (35 Units total) into the skin at bedtime. 10/30/12  Yes Shanker Kristeen Mans, MD  Insulin Pen Needle (PRODIGY MINI PEN NEEDLES) 31G X 5 MM MISC by Does not apply route.   Yes Historical Provider, MD  Insulin Pen Needle 31G X 6 MM MISC by Does not apply route.   Yes Historical Provider, MD  levothyroxine (SYNTHROID, LEVOTHROID) 137 MCG tablet Take 137 mcg by mouth every morning.    Yes Historical Provider, MD  lidocaine (LIDODERM) 5 % Place 1 patch onto the skin as needed (as needed for back pain). Remove & Discard patch within 12 hours or as directed by MD   Yes Historical Provider, MD  Menthol, Topical Analgesic, (ICY HOT EX) Apply 1 application topically at bedtime. Apply to back   Yes Historical Provider, MD  montelukast (SINGULAIR) 10 MG tablet Take 10 mg by mouth at bedtime.     Yes Historical Provider, MD  pantoprazole (PROTONIX) 40 MG tablet Take 40 mg  by mouth every morning.    Yes Historical Provider, MD  potassium chloride SA (K-DUR,KLOR-CON) 20 MEQ tablet Take 1 tablet (20 mEq total) by mouth every morning. 10/30/12  Yes Shanker Kristeen Mans, MD  pregabalin (LYRICA) 50 MG capsule Take 50 mg by mouth 2 (two) times daily.    Yes Historical Provider, MD  Saxagliptin-Metformin (KOMBIGLYZE XR) 09-998 MG TB24 Take 1 tablet by mouth daily. 10/30/12  Yes Shanker Kristeen Mans, MD  simvastatin (ZOCOR) 20 MG tablet Take 20 mg by mouth every evening.     Yes Historical Provider, MD  traMADol (ULTRAM) 50 MG tablet Take 1 tablet (50 mg total) by mouth every 6 (six) hours as needed for pain. 09/18/12  Yes Janne Napoleon, NP  vitamin B-12 (CYANOCOBALAMIN) 1000 MCG tablet Take 1,000 mcg by mouth every morning.    Yes Historical Provider, MD     Exam  General  appearance :Awake, alert, not in any distress. Speech Clear. Not toxic Looking HEENT: Atraumatic and Normocephalic, pupils equally reactive to light and accomodation Neck: supple, no JVD. No cervical lymphadenopathy.  Chest:Good air entry bilaterally, no added sounds  CVS: S1 S2 regular, no murmurs.  Abdomen: Bowel sounds present, Non tender and not distended with no gaurding, rigidity or rebound. Extremities: B/L Lower Ext shows no edema, both legs are warm to touch Neurology: Awake alert, and oriented X 3, CN II-XII intact, Non focal Skin:No Rash Wounds:N/A    Data Review   CBC No results found for this basename: WBC, HGB, HCT, PLT, MCV, MCH, MCHC, RDW, NEUTRABS, LYMPHSABS, MONOABS, EOSABS, BASOSABS, BANDABS, BANDSABD,  in the last 168 hours  Chemistries   No results found for this basename: NA, K, CL, CO2, GLUCOSE, BUN, CREATININE, GFRCGP, CALCIUM, MG, AST, ALT, ALKPHOS, BILITOT,  in the last 168 hours ------------------------------------------------------------------------------------------------------------------ No results found for this basename: HGBA1C,  in the last 72 hours ------------------------------------------------------------------------------------------------------------------ No results found for this basename: CHOL, HDL, LDLCALC, TRIG, CHOLHDL, LDLDIRECT,  in the last 72 hours ------------------------------------------------------------------------------------------------------------------ No results found for this basename: TSH, T4TOTAL, FREET3, T3FREE, THYROIDAB,  in the last 72 hours ------------------------------------------------------------------------------------------------------------------ No results found for this basename: VITAMINB12, FOLATE, FERRITIN, TIBC, IRON, RETICCTPCT,  in the last 72 hours  Coagulation profile  No results found for this basename: INR, PROTIME,  in the last 168 hours    Assessment & Plan   Uncontrolled diabetes - Increase  Lantus to 35 units, continue with 5 units of NovoLog with meals - Asked the patient and her caregiver to check some postprandial readings as well. - Will bring her back in 4 weeks for further optimization of her diabetic regimen  History of orthostatic hypotension - No longer orthostatic - Is no longer taking Florinef - blood pressure seems to be stable without any antihypertensive medications - Monitor for now  ? Diabetic nephropathy - A. she claims that she seemed he had a urinalysis done at her house by a visiting RN-and was told that she had protein in his urine. - Will check a microalbumin creatinine ratio - Unfortunately will hold off on starting ACE inhibitors because of reported history of orthostatic hypotension  Diabetic neuropathy - Continue Lyrica  History of hypothyroidism - TSH mildly elevated - Recheck TSH in next few months- may need slight increase in her levothyroxine dose  Patient claims she is  scheduled for a colonoscopy on July 7  Return to clinic in 4 weeks

## 2012-10-30 NOTE — Progress Notes (Signed)
Pt is here for a gen f/u and was told by nurse from [place] to f/u for protein in the Urine, Dtap, BP check She is alert and oriented w/no signs of acute distress.

## 2012-10-30 NOTE — Progress Notes (Signed)
Pt discharged w/o having labs done (urine)... Called pt and notified her that she needs to come back Pt states she can't make it today but will come tomorrow... Adv pt to ask for Aaron Edelman or Langley Gauss when she comes in if they have any questions.

## 2012-11-01 LAB — MICROALBUMIN / CREATININE URINE RATIO
Microalb Creat Ratio: 104.4 mg/g — ABNORMAL HIGH (ref 0.0–30.0)
Microalb, Ur: 9.74 mg/dL — ABNORMAL HIGH (ref 0.00–1.89)

## 2012-11-26 ENCOUNTER — Telehealth: Payer: Self-pay

## 2012-11-26 NOTE — Telephone Encounter (Signed)
Message copied by Dorothe Pea on Mon Nov 26, 2012  5:49 PM ------      Message from: Amy [name]      Created: Mon Nov 26, 2012  3:47 PM       Please schedule followup appointment in 1-2 weeks, have to review labs with patient ------

## 2012-11-26 NOTE — Telephone Encounter (Signed)
Left message to return our call.

## 2012-12-05 ENCOUNTER — Telehealth: Payer: Self-pay | Admitting: *Deleted

## 2012-12-05 NOTE — Telephone Encounter (Signed)
Patient stated that [facility] comes to her home in order to help her with her medications due to her visual impairment.  CSW contacted [place] in order to identify process for placing medical orders.  CSW awaiting contact from [place] [facility] to follow up on [facility] orders.

## 2012-12-06 ENCOUNTER — Telehealth: Payer: Self-pay | Admitting: Family Medicine

## 2012-12-06 NOTE — Telephone Encounter (Signed)
Pt would like to know if dosage for med prescribed on 10/30/12 could be lowered, please f/u with pt.

## 2012-12-06 NOTE — Telephone Encounter (Signed)
12/06/12 Spoke with patient made regarding medication adjustment . Appointment made for 12/10/12 to discuss with doctor. P.Helyne Genther,RN BSN [facility]

## 2012-12-10 ENCOUNTER — Ambulatory Visit: Payer: PRIVATE HEALTH INSURANCE

## 2012-12-10 ENCOUNTER — Telehealth: Payer: Self-pay | Admitting: Family Medicine

## 2012-12-10 NOTE — Telephone Encounter (Signed)
[facility] nurse needs order to continue pt's meds.  Pt has not had meds since 12/05/12. [facility] [phone].

## 2012-12-11 ENCOUNTER — Telehealth: Payer: Self-pay | Admitting: *Deleted

## 2012-12-11 NOTE — Telephone Encounter (Signed)
12/11/12 Attempt to reach [place] spoke with patient made aware to have [facility] to fax over  Information regarding order for nurse to continue with medication.Pt stated she will have them fax to number [phone] P[facility] BSN [facility]

## 2012-12-12 ENCOUNTER — Encounter (HOSPITAL_COMMUNITY): Payer: Self-pay

## 2012-12-12 ENCOUNTER — Emergency Department (HOSPITAL_COMMUNITY)
Admission: EM | Admit: 2012-12-12 | Discharge: 2012-12-12 | Disposition: A | Discharge status: Home / Self Care | Payer: PRIVATE HEALTH INSURANCE | Attending: Emergency Medicine | Admitting: Emergency Medicine

## 2012-12-12 DIAGNOSIS — R531 Weakness: Secondary | ICD-10-CM

## 2012-12-12 DIAGNOSIS — E785 Hyperlipidemia, unspecified: Secondary | ICD-10-CM | POA: Insufficient documentation

## 2012-12-12 DIAGNOSIS — Z79899 Other long term (current) drug therapy: Secondary | ICD-10-CM | POA: Insufficient documentation

## 2012-12-12 DIAGNOSIS — Z8669 Personal history of other diseases of the nervous system and sense organs: Secondary | ICD-10-CM | POA: Insufficient documentation

## 2012-12-12 DIAGNOSIS — I951 Orthostatic hypotension: Secondary | ICD-10-CM | POA: Insufficient documentation

## 2012-12-12 DIAGNOSIS — Z880 Allergy status to penicillin: Secondary | ICD-10-CM | POA: Insufficient documentation

## 2012-12-12 DIAGNOSIS — E039 Hypothyroidism, unspecified: Secondary | ICD-10-CM | POA: Insufficient documentation

## 2012-12-12 DIAGNOSIS — E1142 Type 2 diabetes mellitus with diabetic polyneuropathy: Secondary | ICD-10-CM | POA: Insufficient documentation

## 2012-12-12 DIAGNOSIS — Z87891 Personal history of nicotine dependence: Secondary | ICD-10-CM | POA: Insufficient documentation

## 2012-12-12 DIAGNOSIS — R5383 Other fatigue: Secondary | ICD-10-CM | POA: Insufficient documentation

## 2012-12-12 DIAGNOSIS — Z794 Long term (current) use of insulin: Secondary | ICD-10-CM | POA: Insufficient documentation

## 2012-12-12 DIAGNOSIS — R42 Dizziness and giddiness: Secondary | ICD-10-CM | POA: Insufficient documentation

## 2012-12-12 DIAGNOSIS — E1149 Type 2 diabetes mellitus with other diabetic neurological complication: Secondary | ICD-10-CM | POA: Insufficient documentation

## 2012-12-12 DIAGNOSIS — I10 Essential (primary) hypertension: Secondary | ICD-10-CM | POA: Insufficient documentation

## 2012-12-12 DIAGNOSIS — R5381 Other malaise: Secondary | ICD-10-CM | POA: Insufficient documentation

## 2012-12-12 DIAGNOSIS — J45909 Unspecified asthma, uncomplicated: Secondary | ICD-10-CM | POA: Insufficient documentation

## 2012-12-12 LAB — CBC WITH DIFFERENTIAL/PLATELET
Basophils Absolute: 0 10*3/uL (ref 0.0–0.1)
Basophils Relative: 1 % (ref 0–1)
Eosinophils Absolute: 0.1 10*3/uL (ref 0.0–0.7)
Eosinophils Relative: 1 % (ref 0–5)
HCT: 38 % (ref 36.0–46.0)
Hemoglobin: 13.4 g/dL (ref 12.0–15.0)
MCH: 26.9 pg (ref 26.0–34.0)
MCHC: 35.3 g/dL (ref 30.0–36.0)
Monocytes Absolute: 0.7 10*3/uL (ref 0.1–1.0)
Monocytes Relative: 8 % (ref 3–12)
RDW: 14.5 % (ref 11.5–15.5)

## 2012-12-12 LAB — URINALYSIS W MICROSCOPIC + REFLEX CULTURE
Glucose, UA: NEGATIVE mg/dL
Leukocytes, UA: NEGATIVE
Protein, ur: 30 mg/dL — AB
Specific Gravity, Urine: 1.017 (ref 1.005–1.030)

## 2012-12-12 LAB — COMPREHENSIVE METABOLIC PANEL
Albumin: 3.2 g/dL — ABNORMAL LOW (ref 3.5–5.2)
BUN: 26 mg/dL — ABNORMAL HIGH (ref 6–23)
Calcium: 9.7 mg/dL (ref 8.4–10.5)
Creatinine, Ser: 1.24 mg/dL — ABNORMAL HIGH (ref 0.50–1.10)
Total Bilirubin: 0.2 mg/dL — ABNORMAL LOW (ref 0.3–1.2)
Total Protein: 7 g/dL (ref 6.0–8.3)

## 2012-12-12 MED ORDER — SODIUM CHLORIDE 0.9 % IV BOLUS (SEPSIS)
1000.0000 mL | Freq: Once | INTRAVENOUS | Status: AC
  Administered 2012-12-12: 1000 mL via INTRAVENOUS

## 2012-12-12 MED ORDER — KETOROLAC TROMETHAMINE 30 MG/ML IJ SOLN
30.0000 mg | Freq: Once | INTRAMUSCULAR | Status: AC
  Administered 2012-12-12: 30 mg via INTRAVENOUS
  Filled 2012-12-12: qty 1

## 2012-12-12 NOTE — ED Notes (Signed)
Pt complains of general weakness. Pt states that she occasionally sees "floater" with the dizziness/weakness.

## 2012-12-12 NOTE — ED Notes (Signed)
Per report from [facility] pt reports having a increase in weakness x 3 days.  No distress noted.  Pt is blind.  CBG was 163 by EMS.  NSR on monitor.  Weakness is noted to be general in nature bilaterally.

## 2012-12-12 NOTE — ED Notes (Signed)
Patient is resting comfortably in bed listening to TV.

## 2012-12-12 NOTE — ED Provider Notes (Signed)
CSN: [id number]     Arrival date & time 12/12/12  1526 History     First MD Initiated Contact with Patient 12/12/12 1528     Chief Complaint  Patient presents with  . Weakness   (Consider location/radiation/quality/duration/timing/severity/associated sxs/prior Treatment) HPI Amy [name] is a 52 y.o. female who presents to ED with complaint of weakness. States she has been feeling weak and light headed for the last about 3-4 days. States usually feels well in the morning. States symptoms begin about 10-20 minutes after taking her morning medications. States her nursing aid comes in the morning and checks her BP and blood sugars. States initially they are normal, but when her symptoms start, pt states her BP drops into 80s/40s. States symptoms are worse with sitting up and standing up. Denies focal deficits. Denies fever, chills, malaise. Hx of the same, unsure what cause is. Pt does not take any blood pressure medications.    Past Medical History  Diagnosis Date  . Asthma   . Diabetes mellitus   . Hypertension   . Blind   . Hypothyroidism   . Hyperlipidemia   . Diabetic neuropathy   . Blindness and low vision     left eye glass eye,  legally blind in right eye   Past Surgical History  Procedure Laterality Date  . Cesarean section      x 2  . Abdominal hysterectomy    . Cholecystectomy    . Enucleation     Family History  Problem Relation Age of Onset  . Diabetes Sister    History  Substance Use Topics  . Smoking status: Former Smoker    Quit date: 05/22/1992  . Smokeless tobacco: Never Used  . Alcohol Use: No   OB History   Grav Para Term Preterm Abortions TAB SAB Ect Mult Living                 Obstetric Comments   Pt has two sons.     Review of Systems  Constitutional: Positive for fatigue. Negative for fever and chills.  HENT: Negative for neck pain and neck stiffness.   Respiratory: Negative.  Negative for chest tightness.   Cardiovascular: Negative.    Gastrointestinal: Negative.   Genitourinary: Negative.   Neurological: Positive for dizziness, weakness and light-headedness. Negative for numbness.  All other systems reviewed and are negative.    Allergies  Codeine; Iohexol; Penicillins; and Sulfa antibiotics  Home Medications   Current Outpatient Rx  Name  Route  Sig  Dispense  Refill  . albuterol (PROVENTIL HFA;VENTOLIN HFA) 108 (90 BASE) MCG/ACT inhaler   Inhalation   Inhale 2 puffs into the lungs every 4 (four) hours as needed. For shortness of breath         . glucose blood (PRODIGY AUTOCODE TEST) test strip   Other   1 each by Other route as needed for other. Use as instructed         . insulin aspart (NOVOLOG) 100 UNIT/ML injection   Subcutaneous   Inject 5 Units into the skin 3 (three) times daily before meals. DO Not take this if CBG <120   1 vial   0   . insulin glargine (LANTUS) 100 UNIT/ML injection   Subcutaneous   Inject 0.35 mLs (35 Units total) into the skin at bedtime.   10 mL   3   . Insulin Pen Needle (PRODIGY MINI PEN NEEDLES) 31G X 5 MM MISC   Does not apply  by Does not apply route.         . Insulin Pen Needle 31G X 6 MM MISC   Does not apply   by Does not apply route.         Marland Kitchen levothyroxine (SYNTHROID, LEVOTHROID) 137 MCG tablet   Oral   Take 137 mcg by mouth every morning.          . lidocaine (LIDODERM) 5 %   Transdermal   Place 1 patch onto the skin as needed (as needed for back pain). Remove & Discard patch within 12 hours or as directed by MD         . Menthol, Topical Analgesic, (ICY HOT EX)   Apply externally   Apply 1 application topically at bedtime. Apply to back         . montelukast (SINGULAIR) 10 MG tablet   Oral   Take 10 mg by mouth at bedtime.           . pantoprazole (PROTONIX) 40 MG tablet   Oral   Take 40 mg by mouth every morning.          . potassium chloride SA (K-DUR,KLOR-CON) 20 MEQ tablet   Oral   Take 1 tablet (20 mEq total) by  mouth every morning.   30 tablet   0   . pregabalin (LYRICA) 50 MG capsule   Oral   Take 50 mg by mouth 2 (two) times daily.          . Saxagliptin-Metformin (KOMBIGLYZE XR) 09-998 MG TB24   Oral   Take 1 tablet by mouth daily.   30 tablet   3   . simvastatin (ZOCOR) 20 MG tablet   Oral   Take 20 mg by mouth every evening.           . traMADol (ULTRAM) 50 MG tablet   Oral   Take 1 tablet (50 mg total) by mouth every 6 (six) hours as needed for pain.   15 tablet   0   . vitamin B-12 (CYANOCOBALAMIN) 1000 MCG tablet   Oral   Take 1,000 mcg by mouth every morning.           BP 96/59  Pulse 69  Temp(Src) 98 F (36.7 C) (Oral)  Resp 18  SpO2 98% Physical Exam  Nursing note and vitals reviewed. Constitutional: She is oriented to person, place, and time. She appears well-developed and well-nourished. No distress.  Eyes:  Bilateral ptosis  Neck: Neck supple.  Cardiovascular: Normal rate, regular rhythm and normal heart sounds.   Dorsal pedal pulses intact bilaterally  Pulmonary/Chest: Effort normal and breath sounds normal. No respiratory distress. She has no wheezes. She has no rales.  Abdominal: Soft. She exhibits no distension. There is no tenderness. There is no rebound.  Neurological: She is alert and oriented to person, place, and time. No cranial nerve deficit. Coordination normal.  5/5 and equal upper and lower extremity strength bilaterally. Equal grip strength bilaterally. Normal finger to nose and heel to shin. No pronator drift.   Skin: Skin is warm and dry.    ED Course   Procedures (including critical care time)  Labs Reviewed  CBC WITH DIFFERENTIAL - Abnormal; Notable for the following:    MCV 76.2 (*)    All other components within normal limits  COMPREHENSIVE METABOLIC PANEL - Abnormal; Notable for the following:    Glucose, Bld 242 (*)    BUN 26 (*)    Creatinine,  Ser 1.24 (*)    Albumin 3.2 (*)    Total Bilirubin 0.2 (*)    GFR calc non  Af Amer 49 (*)    GFR calc Af Amer 56 (*)    All other components within normal limits  URINALYSIS W MICROSCOPIC + REFLEX CULTURE - Abnormal; Notable for the following:    Protein, ur 30 (*)    Bacteria, UA FEW (*)    Squamous Epithelial / LPF FEW (*)    All other components within normal limits  POCT I-STAT TROPONIN I    Date: 12/12/2012  Rate: 70  Rhythm: normal sinus rhythm  QRS Axis: normal  Intervals: normal  ST/T Wave abnormalities: normal  Conduction Disutrbances:none  Narrative Interpretation:   Old EKG Reviewed: unchanged   No results found. 1. Weakness   2. Orthostatic hypotension     MDM  Pt with dizziness and low blood pressure at home. Hx of the same. Admission on 08/09/12 for same symptoms. Had negative mri at that time and work up. 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 Today Labs as above. Blood sugar 242. Pt hydrated with 1L of NS. Her blood pressure is back to normal. She states her dizziness improved. At this time there is no indication for admission. Will d/c home. Pt has apt with PCP in 5 days.   Filed Vitals:   12/12/12 1745 12/12/12 1751 12/12/12 1815 12/12/12 1830  BP: 119/69 157/80 134/67 110/62  Pulse: 62 61 61 71  Temp:  98 F (36.7 C)    TempSrc:  Oral    Resp: 20 17 26 27   SpO2: 99% 99% 96% 94%     Renold Genta, PA-C 12/12/12 1953

## 2012-12-13 NOTE — ED Provider Notes (Signed)
Medical screening examination/treatment/procedure(s) were performed by non-physician practitioner and as supervising physician I was immediately available for consultation/collaboration.   Julianne Rice, MD 12/13/12 [phone]

## 2012-12-18 ENCOUNTER — Telehealth: Payer: Self-pay | Admitting: *Deleted

## 2012-12-18 ENCOUNTER — Ambulatory Visit: Payer: PRIVATE HEALTH INSURANCE | Attending: Family Medicine | Admitting: Internal Medicine

## 2012-12-18 MED ORDER — TRAMADOL HCL 50 MG PO TABS: 50.0000 mg | ORAL_TABLET | Freq: Four times a day (QID) | ORAL | Status: DC | PRN

## 2012-12-18 MED ORDER — INSULIN GLARGINE 100 UNIT/ML ~~LOC~~ SOLN: 40.0000 [IU] | Freq: Every day | SUBCUTANEOUS | Status: DC

## 2012-12-18 MED ORDER — MIDODRINE HCL 5 MG PO TABS: 5.0000 mg | ORAL_TABLET | Freq: Three times a day (TID) | ORAL | Status: DC

## 2012-12-18 NOTE — Telephone Encounter (Signed)
12/18/12 Call placed to [facility] regarding patient need for Home Health. Spoke with Helene Kelp who will Have Velva Harman( the home health nurse) fax over the order   she needs for the doctor to sign and fax back so that [facility] Can continue to assist the patient with their needs. [phone] . P[facility]

## 2012-12-18 NOTE — Progress Notes (Unsigned)
Patient states her blood pressure has been running low lately Makes her feel dizzy Has not been taking her metformin Makes her stomach upset

## 2012-12-18 NOTE — Progress Notes (Unsigned)
Patient ID: Amy [name], female   DOB: July 29, 1960, 52 y.o.   MRN: [id number]  CC:  HPI:  52 year old female with a history of orthostatic hypotension syncope secondary to autonomic neuropathy, who used to be on Florinef, discontinued due to concerns about worsening diabetes, recently in the ER on 7/34 low blood pressure and weakness, who presents to the clinic for evaluation. The patient states that after breakfast her blood pressure usually drops into the 80/40. She has a nurse's aide who monitors her for her blood pressure and her diabetes. Her CBG has been elevated she has been taking 35 units of Lantus at 5 units pre-meal insulin  Allergies  Allergen Reactions  . Codeine     Gets sick  . Iohexol      Code: HIVES, Desc: pt developed itching and hives along with nasal congestion; needs 13 hour premeds for future studies, Onset Date: [id number]   . Penicillins     Gets sick  . Sulfa Antibiotics Nausea And Vomiting   Past Medical History  Diagnosis Date  . Asthma   . Diabetes mellitus   . Hypertension   . Blind   . Hypothyroidism   . Hyperlipidemia   . Diabetic neuropathy   . Blindness and low vision     left eye glass eye,  legally blind in right eye   Current Outpatient Prescriptions on File Prior to Visit  Medication Sig Dispense Refill  . albuterol (PROVENTIL HFA;VENTOLIN HFA) 108 (90 BASE) MCG/ACT inhaler Inhale 2 puffs into the lungs every 4 (four) hours as needed. For shortness of breath      . cyclobenzaprine (FLEXERIL) 10 MG tablet Take 10 mg by mouth 3 (three) times daily as needed for muscle spasms.      Marland Kitchen docusate sodium (COLACE) 100 MG capsule Take 100 mg by mouth daily.      Marland Kitchen glucose blood (PRODIGY AUTOCODE TEST) test strip 1 each by Other route as needed for other. Use as instructed      . insulin aspart (NOVOLOG) 100 UNIT/ML injection Inject 5 Units into the skin 3 (three) times daily before meals. DO Not take this if CBG <120  1 vial  0  . Insulin Pen Needle  (PRODIGY MINI PEN NEEDLES) 31G X 5 MM MISC by Does not apply route.      . Insulin Pen Needle 31G X 6 MM MISC by Does not apply route.      Marland Kitchen levothyroxine (SYNTHROID, LEVOTHROID) 137 MCG tablet Take 137 mcg by mouth every morning.       . lidocaine (LIDODERM) 5 % Place 1 patch onto the skin as needed (as needed for back pain). Remove & Discard patch within 12 hours or as directed by MD      . Menthol, Topical Analgesic, (ICY HOT EX) Apply 1 application topically at bedtime. Apply to back      . montelukast (SINGULAIR) 10 MG tablet Take 10 mg by mouth at bedtime.        . pantoprazole (PROTONIX) 40 MG tablet Take 40 mg by mouth every morning.       . potassium chloride SA (K-DUR,KLOR-CON) 20 MEQ tablet Take 1 tablet (20 mEq total) by mouth every morning.  30 tablet  0  . pregabalin (LYRICA) 50 MG capsule Take 50 mg by mouth 2 (two) times daily.       . Saxagliptin-Metformin (KOMBIGLYZE XR) 09-998 MG TB24 Take 1 tablet by mouth daily.  30 tablet  3  .  simvastatin (ZOCOR) 20 MG tablet Take 20 mg by mouth every evening.        . traMADol (ULTRAM) 50 MG tablet Take 1 tablet (50 mg total) by mouth every 6 (six) hours as needed for pain.  15 tablet  0  . vitamin B-12 (CYANOCOBALAMIN) 1000 MCG tablet Take 1,000 mcg by mouth every morning.        No current facility-administered medications on file prior to visit.   Family History  Problem Relation Age of Onset  . Diabetes Sister    History   Social History  . Marital Status: Single    Spouse Name: N/A    Number of Children: N/A  . Years of Education: N/A   Occupational History  . Not on file.   Social History Main Topics  . Smoking status: Former Smoker    Quit date: 05/22/1992  . Smokeless tobacco: Never Used  . Alcohol Use: No  . Drug Use: No  . Sexually Active: Not Currently   Other Topics Concern  . Not on file   Social History Narrative  . No narrative on file    Review of Systems  Constitutional: Negative for fever,  chills, diaphoresis, activity change, appetite change and fatigue.  HENT: Negative for ear pain, nosebleeds, congestion, facial swelling, rhinorrhea, neck pain, neck stiffness and ear discharge.   Eyes: Negative for pain, discharge, redness, itching and visual disturbance.  Respiratory: Negative for cough, choking, chest tightness, shortness of breath, wheezing and stridor.   Cardiovascular: Negative for chest pain, palpitations and leg swelling.  Gastrointestinal: Negative for abdominal distention.  Genitourinary: Negative for dysuria, urgency, frequency, hematuria, flank pain, decreased urine volume, difficulty urinating and dyspareunia.  Musculoskeletal: Negative for back pain, joint swelling, arthralgias and gait problem.  Neurological: Negative for dizziness, tremors, seizures, syncope, facial asymmetry, speech difficulty, weakness, light-headedness, numbness and headaches.  Hematological: Negative for adenopathy. Does not bruise/bleed easily.  Psychiatric/Behavioral: Negative for hallucinations, behavioral problems, confusion, dysphoric mood, decreased concentration and agitation.    Objective:   Filed Vitals:   12/18/12 0934  BP: 101/69  Pulse: 84  Temp: 98.7 F (37.1 C)  Resp: 16    Physical Exam  Constitutional: Appears well-developed and well-nourished. No distress.  HENT: Normocephalic. External right and left ear normal. Oropharynx is clear and moist.  Eyes: Conjunctivae and EOM are normal. PERRLA, no scleral icterus.  Neck: Normal ROM. Neck supple. No JVD. No tracheal deviation. No thyromegaly.  CVS: RRR, S1/S2 +, no murmurs, no gallops, no carotid bruit.  Pulmonary: Effort and breath sounds normal, no stridor, rhonchi, wheezes, rales.  Abdominal: Soft. BS +,  no distension, tenderness, rebound or guarding.  Musculoskeletal: Normal range of motion. No edema and no tenderness.  Lymphadenopathy: No lymphadenopathy noted, cervical, inguinal. Neuro: Alert. Normal reflexes,  muscle tone coordination. No cranial nerve deficit. Skin: Skin is warm and dry. No rash noted. Not diaphoretic. No erythema. No pallor.  Psychiatric: Normal mood and affect. Behavior, judgment, thought content normal.   Lab Results  Component Value Date   WBC 8.6 12/12/2012   HGB 13.4 12/12/2012   HCT 38.0 12/12/2012   MCV 76.2* 12/12/2012   PLT 186 12/12/2012   Lab Results  Component Value Date   CREATININE 1.24* 12/12/2012   BUN 26* 12/12/2012   NA 137 12/12/2012   K 4.1 12/12/2012   CL 102 12/12/2012   CO2 26 12/12/2012    Lab Results  Component Value Date   HGBA1C 11.2* 10/22/2012  Lipid Panel     Component Value Date/Time   CHOL 170 10/22/2012 1351   TRIG 112 10/22/2012 1351   HDL 56 10/22/2012 1351   CHOLHDL 3.0 10/22/2012 1351   VLDL 22 10/22/2012 1351   LDLCALC 92 10/22/2012 1351       Assessment and plan:   Patient Active Problem List   Diagnosis Date Noted  . Dizziness 08/06/2012  . Orthostatic hypotension 08/06/2012  . Hypernatremia 08/06/2012  . Acute gastroenteritis 08/13/2011  . Gastroparesis 08/13/2011  . Chest pain 08/13/2011  . DM (diabetes mellitus) 08/13/2011  . Oral thrush 08/13/2011  . Gastroenteritis 05/23/2011  . Hypotension 05/23/2011  . Dehydration 05/23/2011  . Blindness 05/23/2011  . DM type 1 causing eye disease, not at goal 05/23/2011  . Asthma 05/23/2011  . Diarrhea 05/23/2011  . Vomiting 05/23/2011  . Hypothyroidism 05/23/2011  . Diabetic neuropathy 05/23/2011  . UTI (lower urinary tract infection) 05/23/2011  . Diabetic nephropathy 05/23/2011       Orthostatic hypotension secondary to diabetic neuropathy Patient has been started on midodrine 5 mg 3 times a day Florinef was discontinued 2 or 3 months ago because of concern about diabetes   Diabetes mellitus Increase Lantus to 40 units Patient advised to monitor his CBG 3 times a day Hemoglobin A1c on her next visit  Followup in 2 weeks  Patient will home health aid to assist her with  her medication needs and blood pressure checks

## 2012-12-29 IMAGING — CR DG ABDOMEN ACUTE W/ 1V CHEST
3 series · 3 of 3 positions shown · non-contrast
Comparison: 09/15/2010

CLINICAL DATA: Epigastric pain radiating to the chest

ACUTE ABDOMEN SERIES (ABDOMEN 2 VIEW & CHEST 1 VIEW)

[w chest pa]
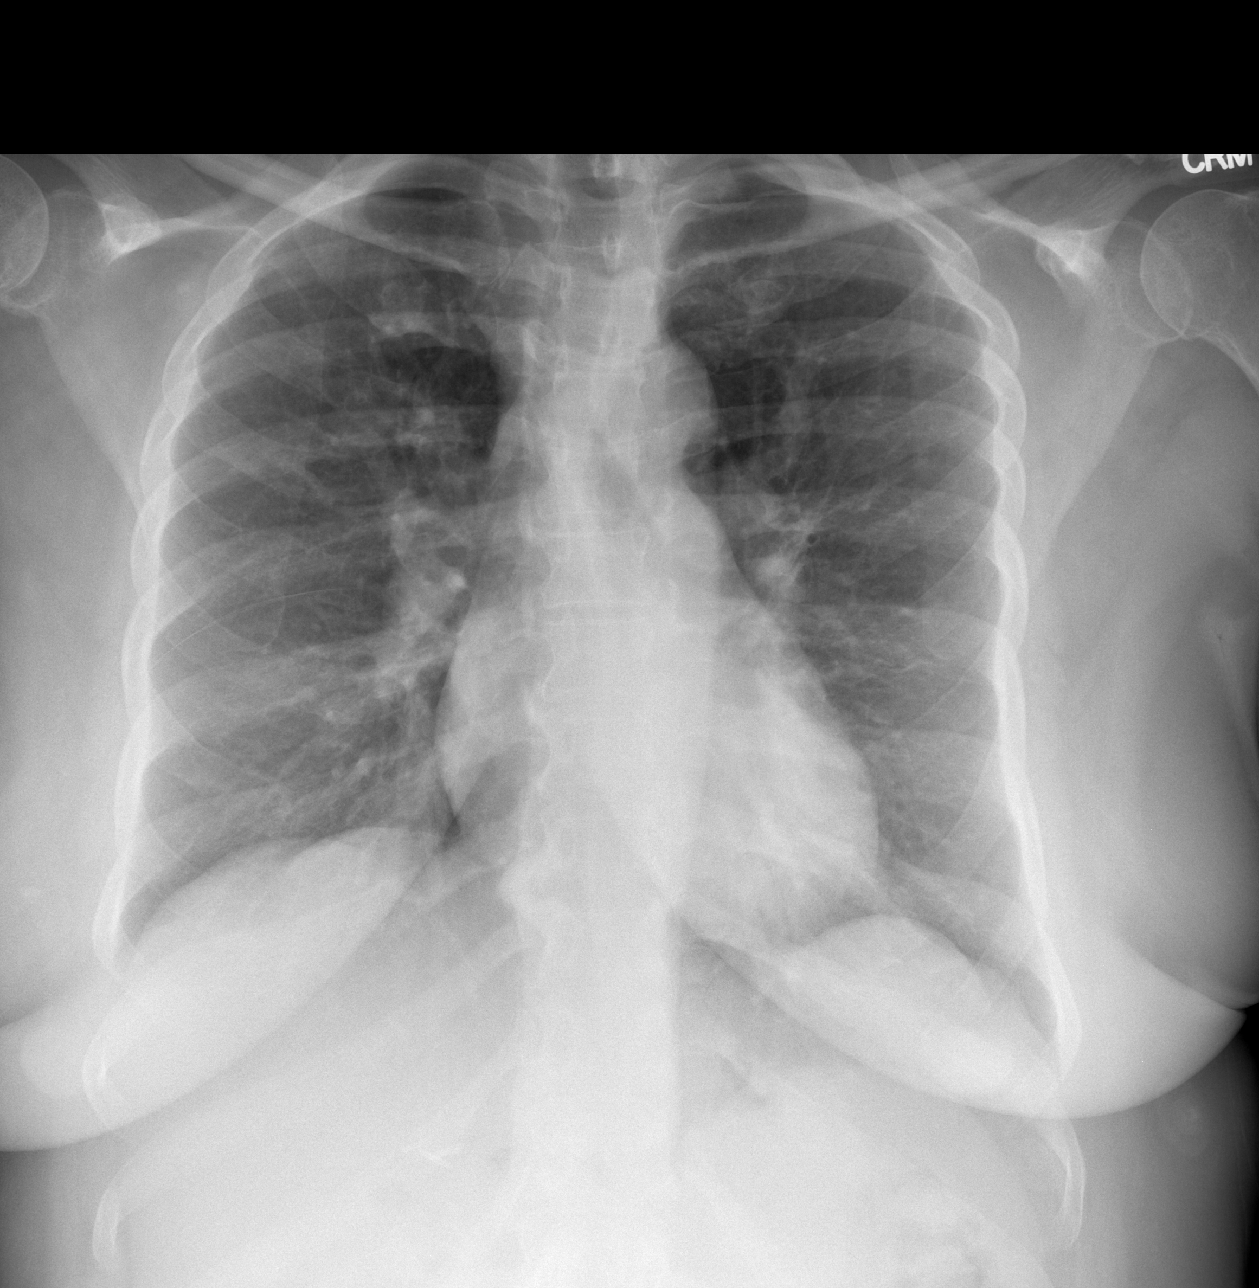

[w abdomen upright]
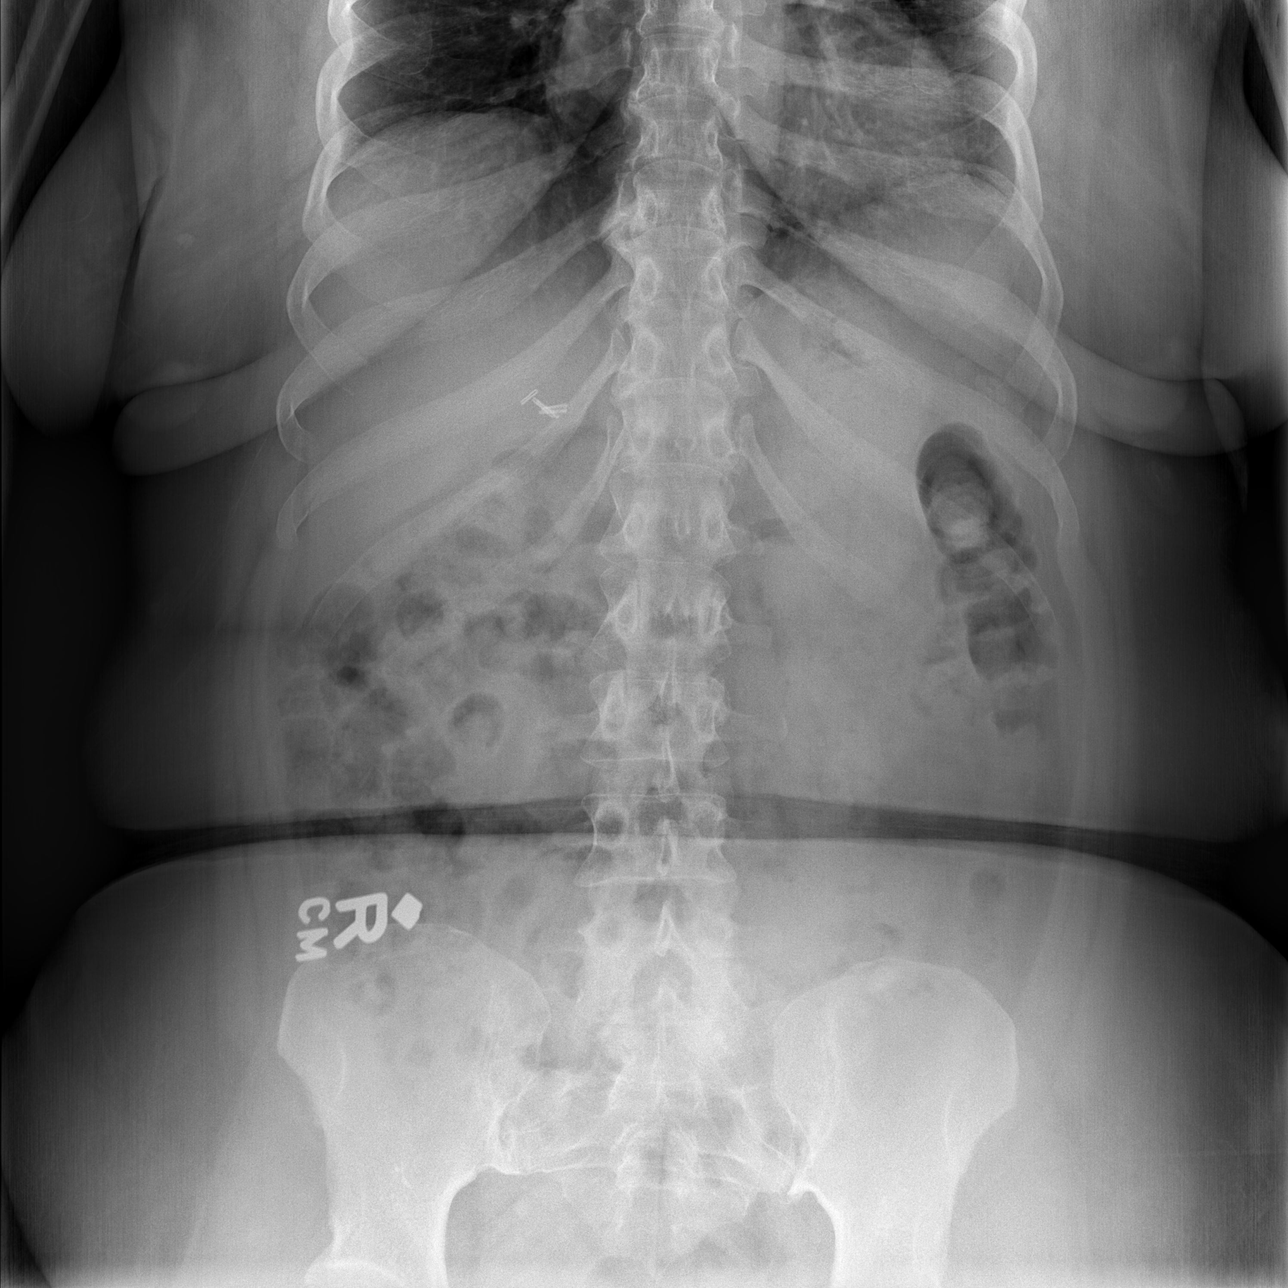

[t abdomen supine]
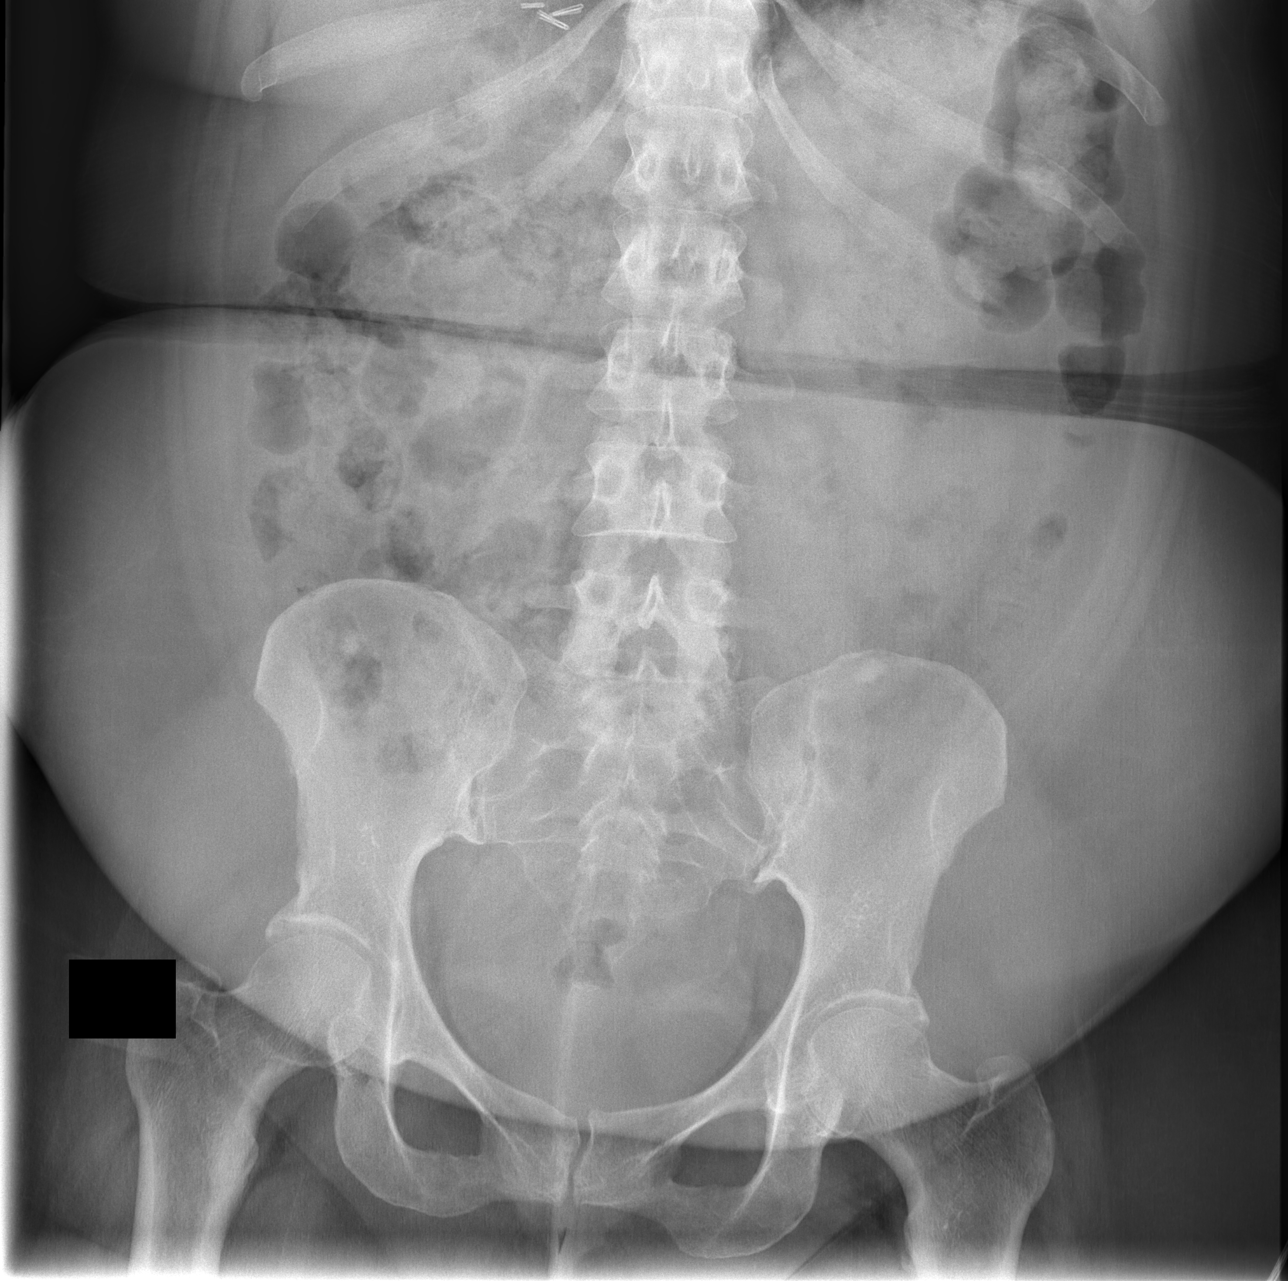

[3 of 3 positions shown; findings below may reference images not displayed]

FINDINGS: Normal heart size and pulmonary vascularity.  No focal
consolidation in the lungs.  No blunting of costophrenic angles.
Small cervical rib at C7 on the right.

Scattered gas and stool in the colon.  No small or large bowel
dilatation.  No free intra-abdominal air.  No abnormal air fluid
levels.  No radiopaque stones.  Surgical clips in the right upper
quadrant no significant changes since the previous study.
IMPRESSION: No evidence of active pulmonary disease.  Nonobstructive bowel gas
pattern.

## 2013-01-01 ENCOUNTER — Ambulatory Visit: Payer: PRIVATE HEALTH INSURANCE

## 2013-01-02 ENCOUNTER — Telehealth: Payer: Self-pay

## 2013-01-02 ENCOUNTER — Ambulatory Visit: Payer: PRIVATE HEALTH INSURANCE | Attending: Family Medicine | Admitting: Internal Medicine

## 2013-01-02 MED ORDER — TRIAMCINOLONE ACETONIDE 0.1 % EX CREA: TOPICAL_CREAM | Freq: Two times a day (BID) | CUTANEOUS | Status: DC

## 2013-01-02 MED ORDER — INSULIN DETEMIR 100 UNIT/ML FLEXPEN: 40.0000 [IU] | PEN_INJECTOR | Freq: Every day | SUBCUTANEOUS | Status: DC

## 2013-01-02 NOTE — Progress Notes (Unsigned)
Patient ID: Amy [name], female   DOB: [birth year], 52 y.o.   MRN: [name]  CC:  HPI: 52 year old female with a history of diabetes, orthostatic hypotension secondary to autonomic neuropathy, who presents for a followup. The patient's blood pressure has significantly improved and stays in the 120s during the day. The patient also complains of palmar dermatitis that started 2 months ago. The patient thinks that her Lyrica. The appearance is more consistent with contact dermatitis of her hands. The patient has not changed any of her hand soaps or dishwashing soap recently. The patient has a home health aide who does most of her chores   Allergies  Allergen Reactions  . Codeine     Gets sick  . Iohexol      Code: HIVES, Desc: pt developed itching and hives along with nasal congestion; needs 13 hour premeds for future studies, Onset Date: [id number]   . Penicillins     Gets sick  . Sulfa Antibiotics Nausea And Vomiting   Past Medical History  Diagnosis Date  . Asthma   . Diabetes mellitus   . Hypertension   . Blind   . Hypothyroidism   . Hyperlipidemia   . Diabetic neuropathy   . Blindness and low vision     left eye glass eye,  legally blind in right eye   Current Outpatient Prescriptions on File Prior to Visit  Medication Sig Dispense Refill  . albuterol (PROVENTIL HFA;VENTOLIN HFA) 108 (90 BASE) MCG/ACT inhaler Inhale 2 puffs into the lungs every 4 (four) hours as needed. For shortness of breath      . cyclobenzaprine (FLEXERIL) 10 MG tablet Take 10 mg by mouth 3 (three) times daily as needed for muscle spasms.      Marland Kitchen docusate sodium (COLACE) 100 MG capsule Take 100 mg by mouth daily.      Marland Kitchen glucose blood (PRODIGY AUTOCODE TEST) test strip 1 each by Other route as needed for other. Use as instructed      . insulin aspart (NOVOLOG) 100 UNIT/ML injection Inject 5 Units into the skin 3 (three) times daily before meals. DO Not take this if CBG <120  1 vial  0  . Insulin Pen  Needle (PRODIGY MINI PEN NEEDLES) 31G X 5 MM MISC by Does not apply route.      . Insulin Pen Needle 31G X 6 MM MISC by Does not apply route.      Marland Kitchen levothyroxine (SYNTHROID, LEVOTHROID) 137 MCG tablet Take 137 mcg by mouth every morning.       . lidocaine (LIDODERM) 5 % Place 1 patch onto the skin as needed (as needed for back pain). Remove & Discard patch within 12 hours or as directed by MD      . Menthol, Topical Analgesic, (ICY HOT EX) Apply 1 application topically at bedtime. Apply to back      . midodrine (PROAMATINE) 5 MG tablet Take 1 tablet (5 mg total) by mouth 3 (three) times daily.  90 tablet  0  . montelukast (SINGULAIR) 10 MG tablet Take 10 mg by mouth at bedtime.        . pantoprazole (PROTONIX) 40 MG tablet Take 40 mg by mouth every morning.       . potassium chloride SA (K-DUR,KLOR-CON) 20 MEQ tablet Take 1 tablet (20 mEq total) by mouth every morning.  30 tablet  0  . pregabalin (LYRICA) 50 MG capsule Take 50 mg by mouth 2 (two) times daily.       Marland Kitchen  Saxagliptin-Metformin (KOMBIGLYZE XR) 09-998 MG TB24 Take 1 tablet by mouth daily.  30 tablet  3  . simvastatin (ZOCOR) 20 MG tablet Take 20 mg by mouth every evening.        . traMADol (ULTRAM) 50 MG tablet Take 1 tablet (50 mg total) by mouth every 6 (six) hours as needed for pain.  45 tablet  0  . vitamin B-12 (CYANOCOBALAMIN) 1000 MCG tablet Take 1,000 mcg by mouth every morning.       . [DISCONTINUED] insulin glargine (LANTUS) 100 UNIT/ML injection Inject 0.4 mLs (40 Units total) into the skin at bedtime.  10 mL  3   No current facility-administered medications on file prior to visit.   Family History  Problem Relation Age of Onset  . Diabetes Sister    History   Social History  . Marital Status: Single    Spouse Name: N/A    Number of Children: N/A  . Years of Education: N/A   Occupational History  . Not on file.   Social History Main Topics  . Smoking status: Former Smoker    Quit date: 05/22/1992  . Smokeless  tobacco: Never Used  . Alcohol Use: No  . Drug Use: No  . Sexual Activity: Not Currently   Other Topics Concern  . Not on file   Social History Narrative  . No narrative on file    Review of Systems  Constitutional: Negative for fever, chills, diaphoresis, activity change, appetite change and fatigue.  HENT: Negative for ear pain, nosebleeds, congestion, facial swelling, rhinorrhea, neck pain, neck stiffness and ear discharge.   Eyes: Negative for pain, discharge, redness, itching and visual disturbance.  Respiratory: Negative for cough, choking, chest tightness, shortness of breath, wheezing and stridor.   Cardiovascular: Negative for chest pain, palpitations and leg swelling.  Gastrointestinal: Negative for abdominal distention.  Genitourinary: Negative for dysuria, urgency, frequency, hematuria, flank pain, decreased urine volume, difficulty urinating and dyspareunia.  Musculoskeletal: Negative for back pain, joint swelling, arthralgias and gait problem.  Neurological: Negative for dizziness, tremors, seizures, syncope, facial asymmetry, speech difficulty, weakness, light-headedness, numbness and headaches.  Hematological: Negative for adenopathy. Does not bruise/bleed easily.  Psychiatric/Behavioral: Negative for hallucinations, behavioral problems, confusion, dysphoric mood, decreased concentration and agitation.    Objective:   Filed Vitals:   01/02/13 1014  BP: 128/78  Pulse: 68  Temp: 97 F (36.1 C)  Resp: 16    Physical Exam  Constitutional: Appears well-developed and well-nourished. No distress.  HENT: Normocephalic. External right and left ear normal. Oropharynx is clear and moist.  Eyes: Conjunctivae and EOM are normal. PERRLA, no scleral icterus.  Neck: Normal ROM. Neck supple. No JVD. No tracheal deviation. No thyromegaly.  CVS: RRR, S1/S2 +, no murmurs, no gallops, no carotid bruit.  Pulmonary: Effort and breath sounds normal, no stridor, rhonchi, wheezes,  rales.  Abdominal: Soft. BS +,  no distension, tenderness, rebound or guarding.  Musculoskeletal: Normal range of motion. No edema and no tenderness.  Lymphadenopathy: No lymphadenopathy noted, cervical, inguinal. Neuro: Alert. Normal reflexes, muscle tone coordination. No cranial nerve deficit. Skin: Skin is warm and dry. No rash noted. Not diaphoretic. No erythema. No pallor.  Psychiatric: Normal mood and affect. Behavior, judgment, thought content normal.   Lab Results  Component Value Date   WBC 8.6 12/12/2012   HGB 13.4 12/12/2012   HCT 38.0 12/12/2012   MCV 76.2* 12/12/2012   PLT 186 12/12/2012   Lab Results  Component Value Date  CREATININE 1.24* 12/12/2012   BUN 26* 12/12/2012   NA 137 12/12/2012   K 4.1 12/12/2012   CL 102 12/12/2012   CO2 26 12/12/2012    Lab Results  Component Value Date   HGBA1C 11.2* 10/22/2012   Lipid Panel     Component Value Date/Time   CHOL 170 10/22/2012 1351   TRIG 112 10/22/2012 1351   HDL 56 10/22/2012 1351   CHOLHDL 3.0 10/22/2012 1351   VLDL 22 10/22/2012 1351   LDLCALC 92 10/22/2012 1351       Assessment and plan:   Patient Active Problem List   Diagnosis Date Noted  . Dizziness 08/06/2012  . Orthostatic hypotension 08/06/2012  . Hypernatremia 08/06/2012  . Acute gastroenteritis 08/13/2011  . Gastroparesis 08/13/2011  . Chest pain 08/13/2011  . DM (diabetes mellitus) 08/13/2011  . Oral thrush 08/13/2011  . Gastroenteritis 05/23/2011  . Hypotension 05/23/2011  . Dehydration 05/23/2011  . Blindness 05/23/2011  . DM type 1 causing eye disease, not at goal 05/23/2011  . Asthma 05/23/2011  . Diarrhea 05/23/2011  . Vomiting 05/23/2011  . Hypothyroidism 05/23/2011  . Diabetic neuropathy 05/23/2011  . UTI (lower urinary tract infection) 05/23/2011  . Diabetic nephropathy 05/23/2011       Orthostatic hypotension resolved Continue midodrine   Diabetes mellitus Continue Lantus Flexipen prescribed for Lantus  Followup in one  month

## 2013-01-02 NOTE — Progress Notes (Unsigned)
Patient here for follow up- re check blood pressure Also concerned her hands break out with blisters

## 2013-01-02 NOTE — Telephone Encounter (Signed)
Spoke with patient to make her aware that the required paperwork Was [facility]ed to [facility] Fax number [phone] So she can continue to get resources at home

## 2013-01-29 ENCOUNTER — Telehealth: Payer: Self-pay | Admitting: Emergency Medicine

## 2013-01-29 NOTE — Telephone Encounter (Signed)
Pt need refills on all meds. Need [facility]scribed to [facility]. Already in system

## 2013-01-31 ENCOUNTER — Telehealth: Payer: Self-pay | Admitting: Family Medicine

## 2013-01-31 NOTE — Telephone Encounter (Signed)
Pt uses [facility], [phone], and needs refills for several medications.  Pt is blind and is unable to list medications she needs.  Please call pharmacy to coordinate meds needed, pt's med delivery date is on the 29th of the each month.

## 2013-02-04 NOTE — Telephone Encounter (Signed)
Pt calling again, says pharmacy called saying nobody has reached out to them about refills.

## 2013-02-05 ENCOUNTER — Telehealth: Payer: Self-pay | Admitting: Emergency Medicine

## 2013-02-05 ENCOUNTER — Other Ambulatory Visit: Payer: Self-pay | Admitting: Emergency Medicine

## 2013-02-05 MED ORDER — CYCLOBENZAPRINE HCL 10 MG PO TABS: 10.0000 mg | ORAL_TABLET | Freq: Three times a day (TID) | ORAL | Status: DC | PRN

## 2013-02-05 MED ORDER — TRIAMCINOLONE ACETONIDE 0.1 % EX CREA: TOPICAL_CREAM | Freq: Two times a day (BID) | CUTANEOUS | Status: DC

## 2013-02-05 MED ORDER — MONTELUKAST SODIUM 10 MG PO TABS: 10.0000 mg | ORAL_TABLET | Freq: Every day | ORAL | Status: DC

## 2013-02-05 MED ORDER — MIDODRINE HCL 5 MG PO TABS: 5.0000 mg | ORAL_TABLET | Freq: Three times a day (TID) | ORAL | Status: DC

## 2013-02-05 MED ORDER — PANTOPRAZOLE SODIUM 40 MG PO TBEC: 40.0000 mg | DELAYED_RELEASE_TABLET | Freq: Every morning | ORAL | Status: DC

## 2013-02-05 MED ORDER — LEVOTHYROXINE SODIUM 137 MCG PO TABS: 137.0000 ug | ORAL_TABLET | Freq: Every morning | ORAL | Status: DC

## 2013-02-05 MED ORDER — INSULIN ASPART 100 UNIT/ML ~~LOC~~ SOLN: 5.0000 [IU] | Freq: Three times a day (TID) | SUBCUTANEOUS | Status: DC

## 2013-02-05 NOTE — Telephone Encounter (Signed)
Spoke with pt regarding medication refill. Scripts sent  in [employer]

## 2013-02-08 ENCOUNTER — Other Ambulatory Visit: Payer: Self-pay | Admitting: Internal Medicine

## 2013-02-13 ENCOUNTER — Other Ambulatory Visit: Payer: Self-pay | Admitting: Internal Medicine

## 2013-02-20 ENCOUNTER — Other Ambulatory Visit (HOSPITAL_BASED_OUTPATIENT_CLINIC_OR_DEPARTMENT_OTHER): Payer: PRIVATE HEALTH INSURANCE | Admitting: Internal Medicine

## 2013-02-20 ENCOUNTER — Telehealth: Payer: Self-pay | Admitting: Emergency Medicine

## 2013-02-20 DIAGNOSIS — Z742 Need for assistance at home and no other household member able to render care: Secondary | ICD-10-CM

## 2013-02-20 NOTE — Telephone Encounter (Signed)
Pt called in requesting order from provider, for home health services to fill pill box due to blindness. Will call [facility] to see if paper work needs faxed here, so doctor can fill out

## 2013-02-20 NOTE — Telephone Encounter (Signed)
Spoke with representativeJenera from [facility] to see if order for home care need order was received via fax. States fax was received but declined due to lack of staffing. Will reach out to clinic social Verlin Dike RN

## 2013-02-22 ENCOUNTER — Telehealth: Payer: Self-pay | Admitting: Emergency Medicine

## 2013-02-22 NOTE — Telephone Encounter (Signed)
If pt call regarding message for me to f/u with [facility] to assist with home medication needs, let her know I spoke with rep from home care and was told order was denied due to lack of staffing. Pt needs to find different health agency. I will talk with Tywan for more resources.

## 2013-03-04 ENCOUNTER — Other Ambulatory Visit: Payer: Self-pay | Admitting: Internal Medicine

## 2013-03-04 ENCOUNTER — Ambulatory Visit: Payer: PRIVATE HEALTH INSURANCE | Attending: Internal Medicine

## 2013-03-04 DIAGNOSIS — Z23 Encounter for immunization: Secondary | ICD-10-CM

## 2013-03-11 ENCOUNTER — Other Ambulatory Visit: Payer: Self-pay | Admitting: Internal Medicine

## 2013-03-13 ENCOUNTER — Ambulatory Visit (INDEPENDENT_AMBULATORY_CARE_PROVIDER_SITE_OTHER): Payer: Medicare Other | Admitting: Podiatrist

## 2013-03-13 ENCOUNTER — Encounter: Payer: Self-pay | Admitting: Podiatrist

## 2013-03-13 VITALS — BP 134/68 | HR 72 | Resp 12 | Ht 59.0 in | Wt 180.0 lb

## 2013-03-13 DIAGNOSIS — B351 Tinea unguium: Secondary | ICD-10-CM

## 2013-03-13 DIAGNOSIS — M79609 Pain in unspecified limb: Secondary | ICD-10-CM

## 2013-03-13 NOTE — Patient Instructions (Signed)
GENERAL FOOT HEALTH INFORMATION:  Moisturize your feet regularly with a cream based lotion such as Cetaphil (Cream) or Eucerin (Cream)- usually available in a tub or crock type of container.  Avoid applying the cream to the toe interspaces themselves to reduce the risk of a fungal infection between the toes.  After showering or bathing be sure to dry well between your toes.    Wear a good fitting shoe-  Running shoe brands I recommend are Rol[employer] Infante, [employer] and [employer].  Always have a shoe fit specialist help you choose your shoes as there are many "varieties" of shoes and they can find you the best fit.  Fleet [employer]et sports/ [employer] ([place], [place], [place]), [employer], [employer] [place]) have trained staff to help you in this process.  The [employer] in [place] has a great selection of euro-comfort casual shoes with good comfort and support as well.  Avoid prolonged use of thin ballet type flats, or flip flops.  Everyday use of these shoes can actually cause foot problems or injuries.  Watch your toenails for any signs of infection including drainage, pus redness or swelling along the sides of the toenails.  Soak in epsom salt water and use antibiotic ointment (OTC) if you notice this start to occur.  If the redness does not resolve within 2-3 days, call for an appointment to be seen.

## 2013-03-13 NOTE — Progress Notes (Signed)
HPI:  Patient presents today for follow up of foot and nail care. Denies any new complaints today.  Objective:  Patients chart is reviewed.  Neurovascular status unchanged.  Patients nails are thickened, discolored, distrophic, friable and brittle with yellow-brown discoloration. Patient subjectively relates they are painful with shoes and with ambulation.both feet  Assessment:  Symptomatic onychomycosis  Plan:  Discussed treatment options and alternatives.  The symptomatic toenails were debrided through manual an mechanical means without complication.  Return appointment recommended at routine intervals of 3 months

## 2013-04-05 ENCOUNTER — Other Ambulatory Visit: Payer: Self-pay | Admitting: Internal Medicine

## 2013-04-13 IMAGING — CR DG CHEST 2V
2 series · 2 of 2 positions shown · non-contrast
Comparison: 08/25/2009

CLINICAL DATA: Hyperglycemia

CHEST - 2 VIEW

[w chest lat]
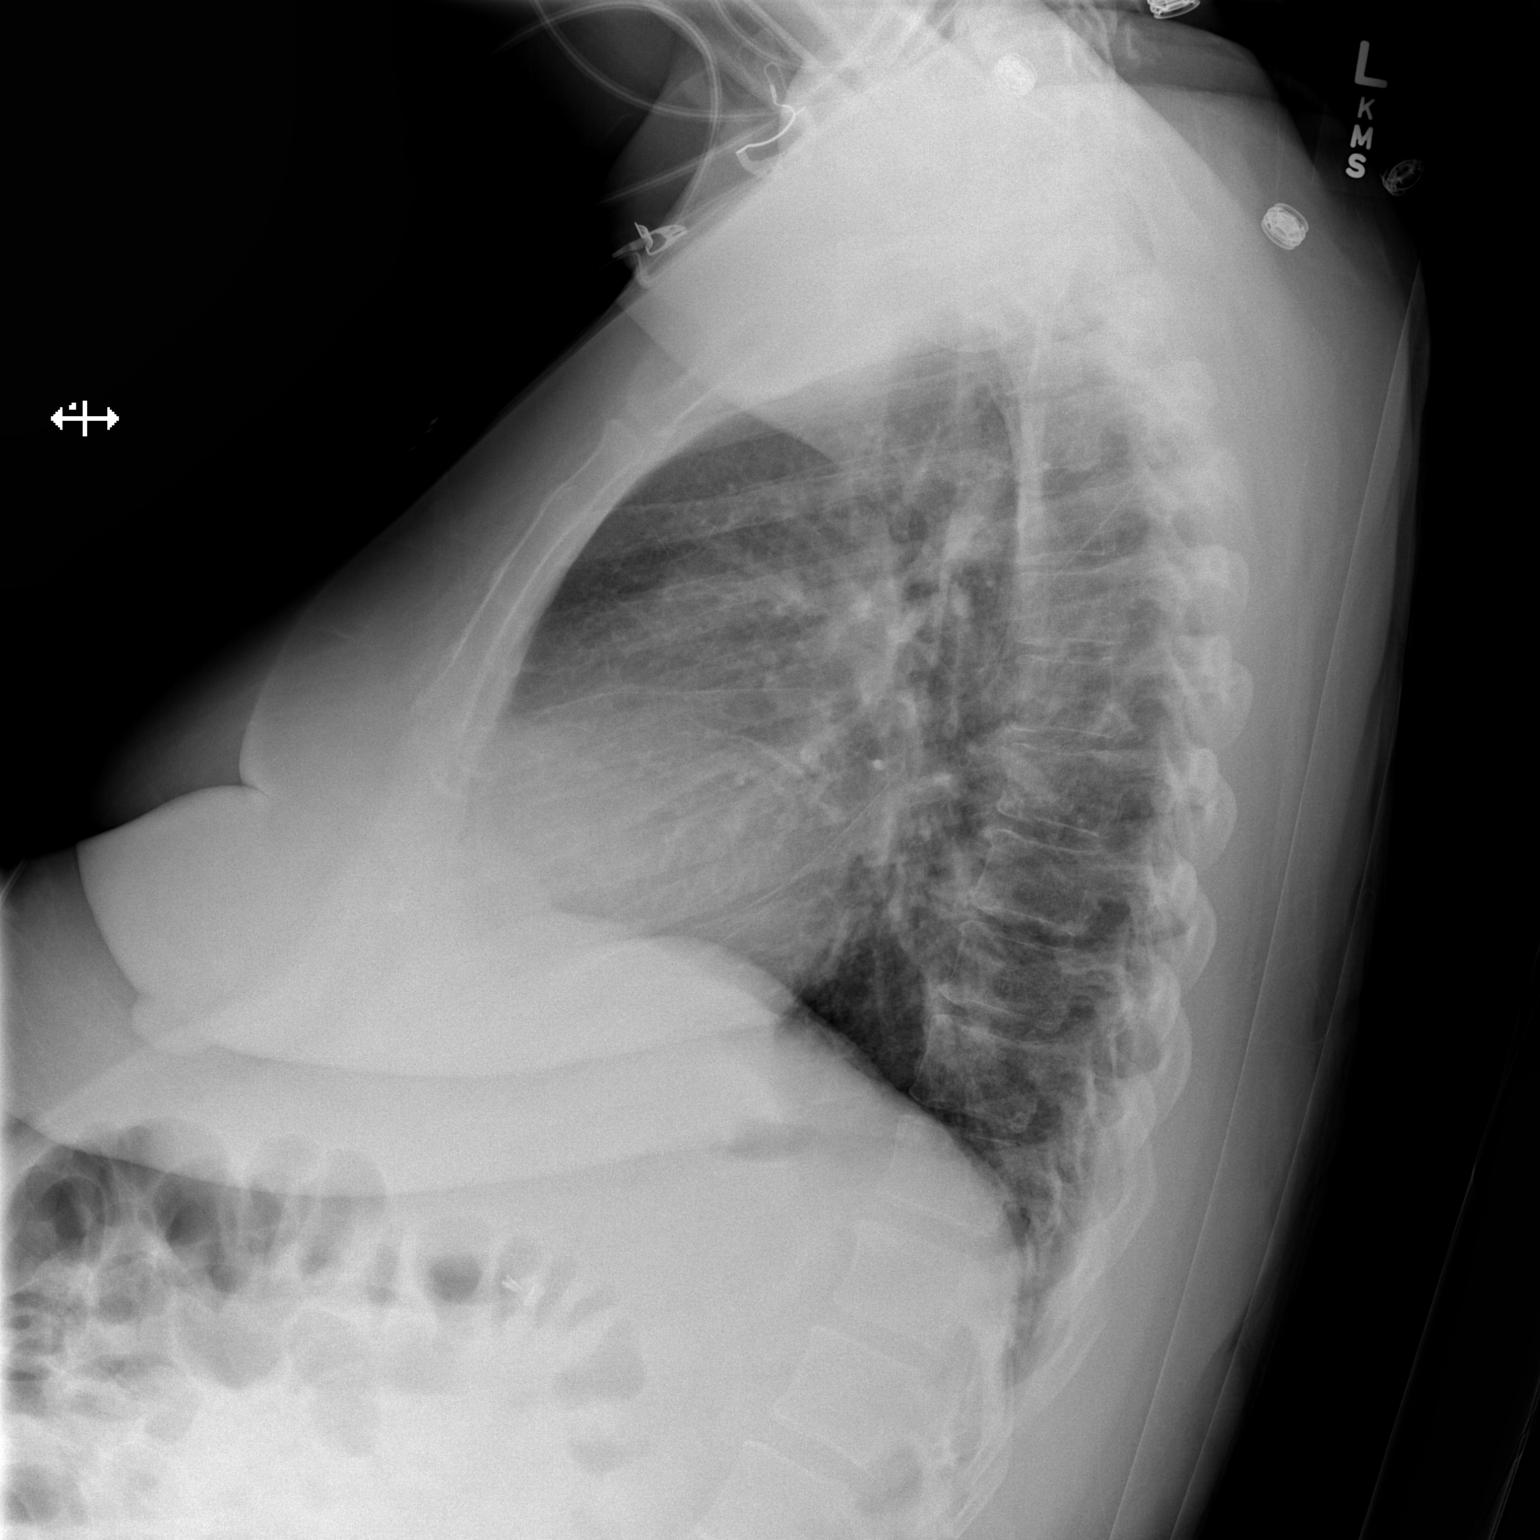

[x chest ap]
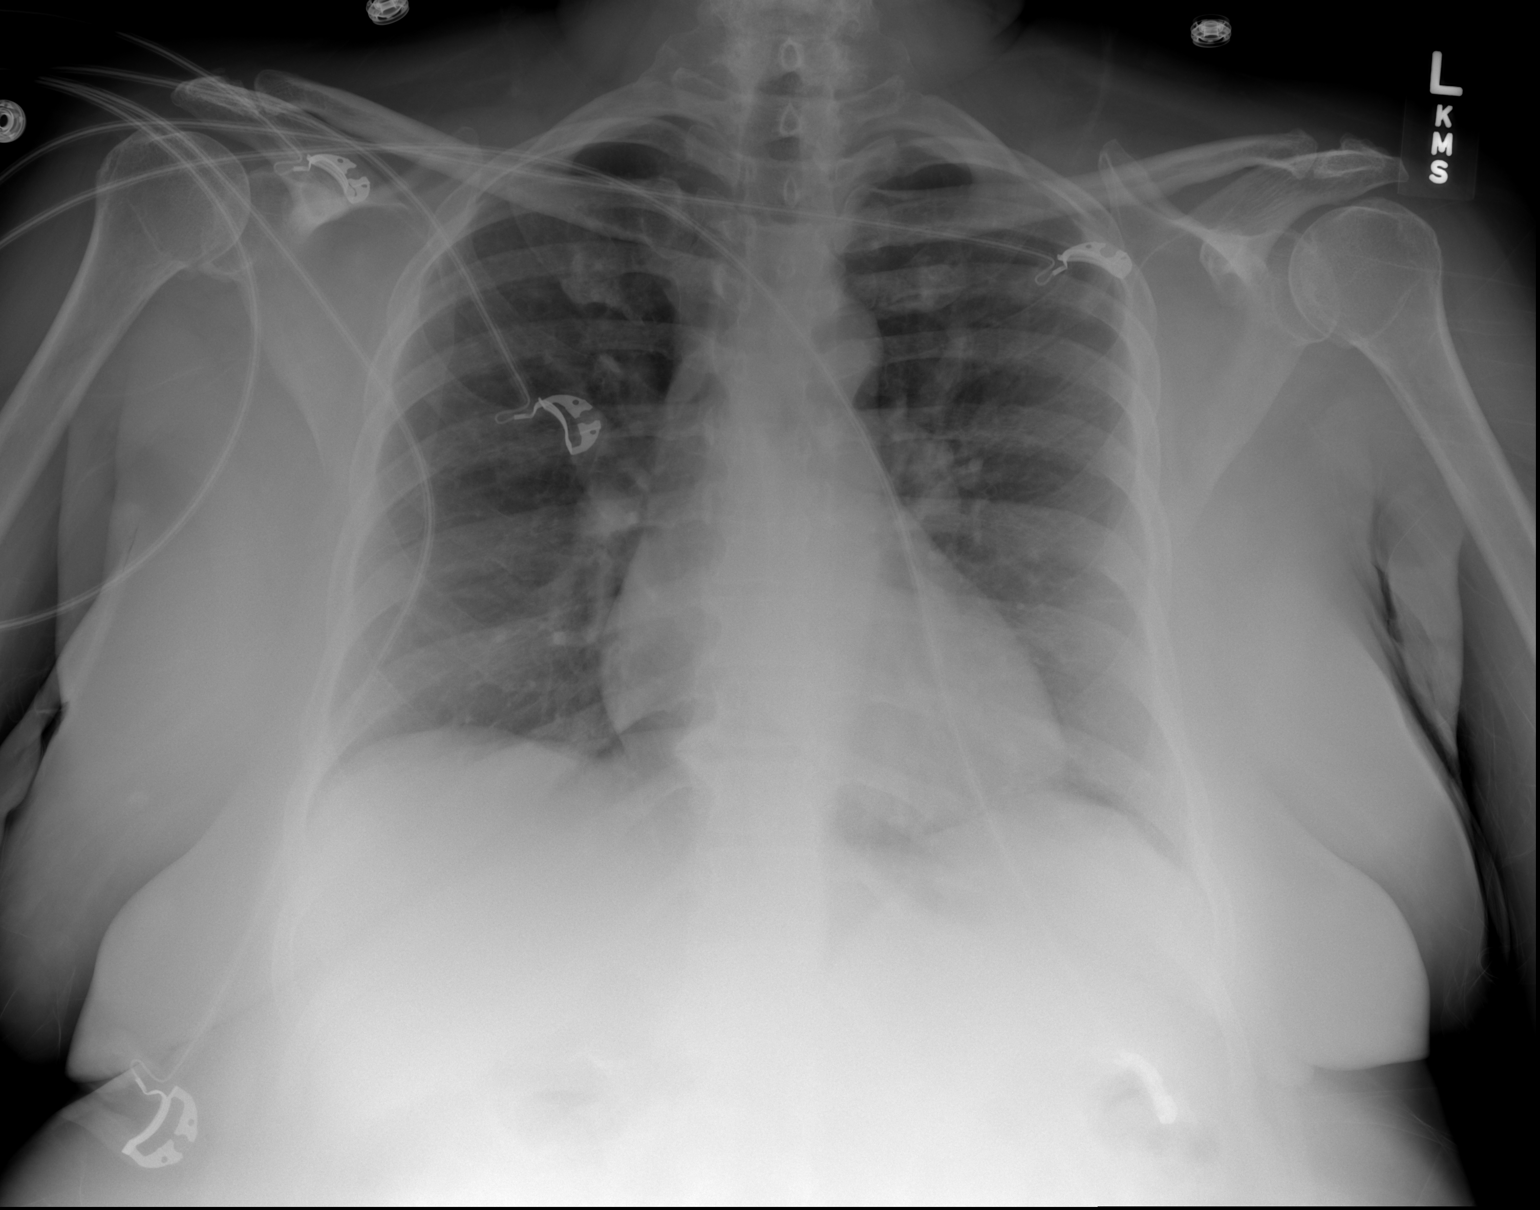

[2 of 2 positions shown; findings below may reference images not displayed]

FINDINGS: Cardiomediastinal silhouette is stable.  Calcified hilar
lymph nodes are stable.  No acute infiltrate or edema.  Stable mild
degenerative changes thoracic spine.
IMPRESSION: No active disease.  No significant change.

## 2013-04-13 IMAGING — CT CT MAXILLOFACIAL W/O CM
1 series · 16 of 30 positions shown, 20 images · non-contrast
Comparison: Head CT 10/20/2008.

CLINICAL DATA: Facial pain and swelling.

CT MAXILLOFACIAL WITHOUT CONTRAST
TECHNIQUE: Multidetector CT imaging of the maxillofacial
structures was performed. Multiplanar CT image reconstructions were
also generated.

[Series 3: facial st · axial · 0.29mm/px · z∈[-50,+86]mm · 16 of 74 slices shown, 20 images]
[im 3/74  brain]
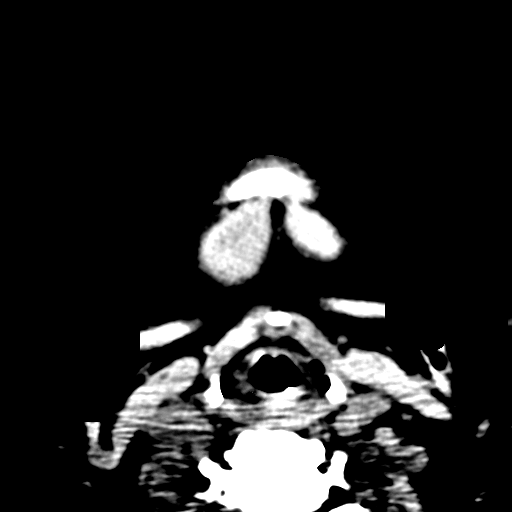
[im 3/74  bone]
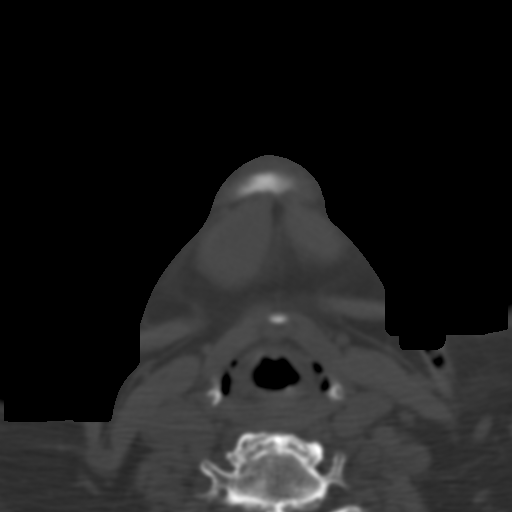
[im 8/74  bone]
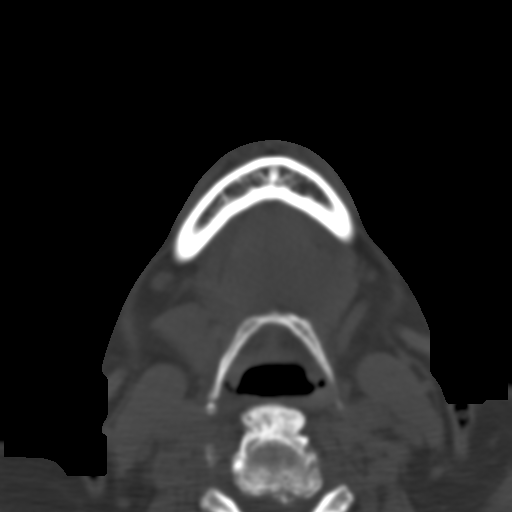
[im 13/74  bone]
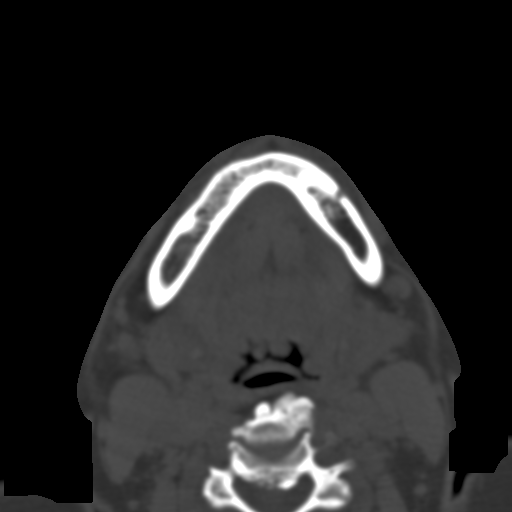
[im 18/74  bone]
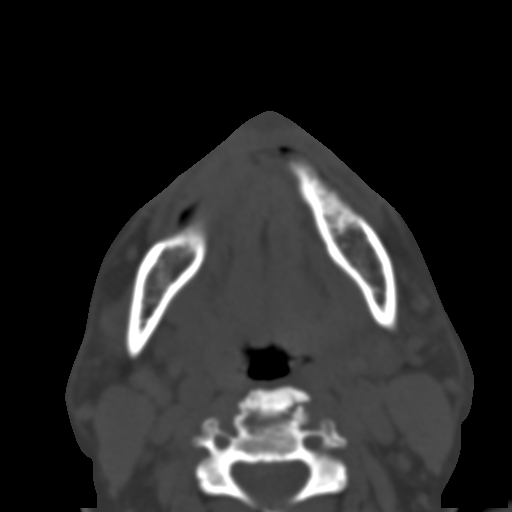
[im 21/74  brain]
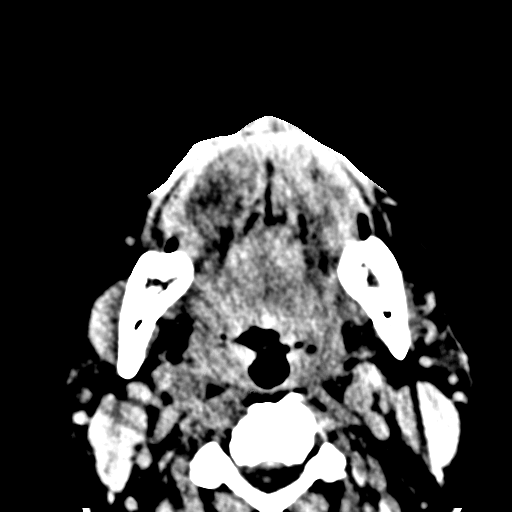
[im 21/74  bone]
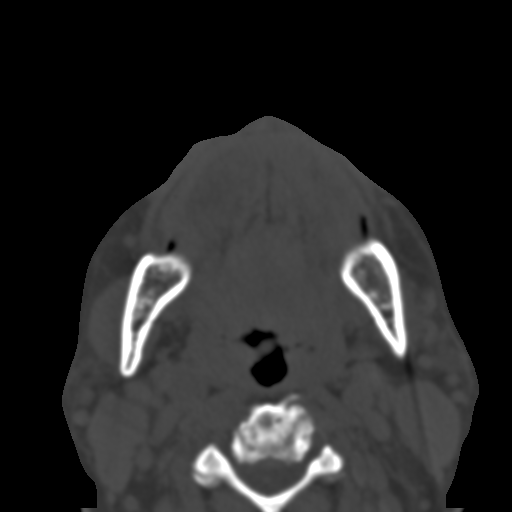
[im 26/74  bone]
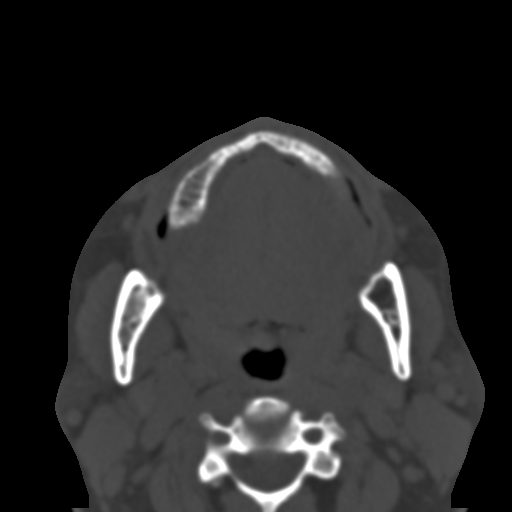
[im 31/74  bone]
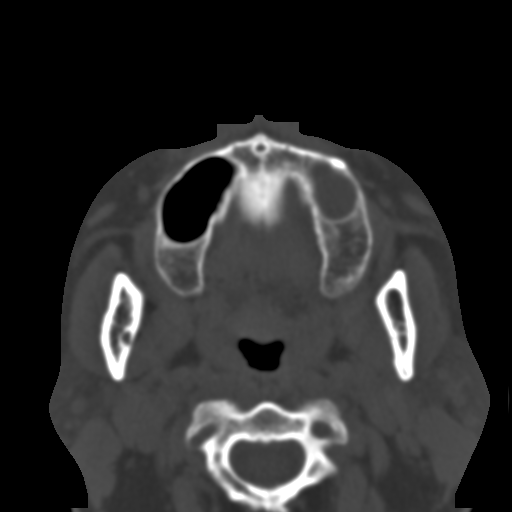
[im 36/74  bone]
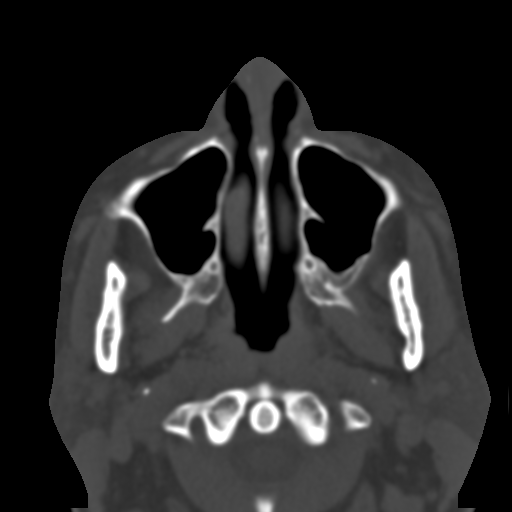
[im 38/74  brain]
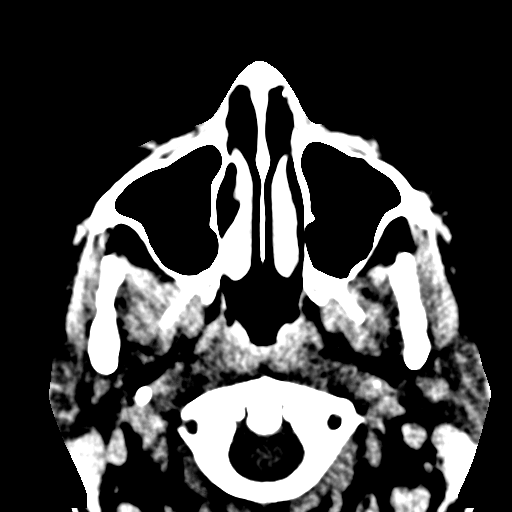
[im 38/74  bone]
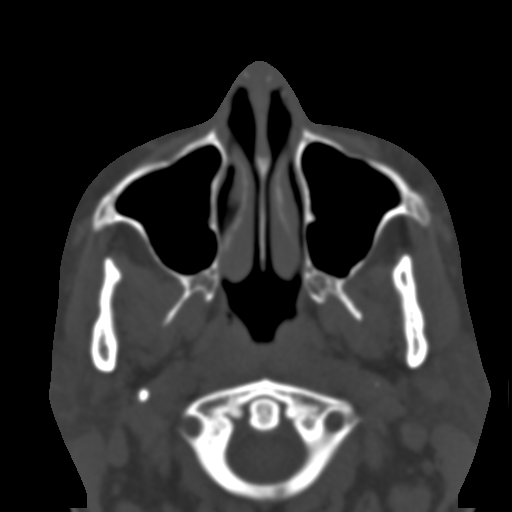
[im 43/74  bone]
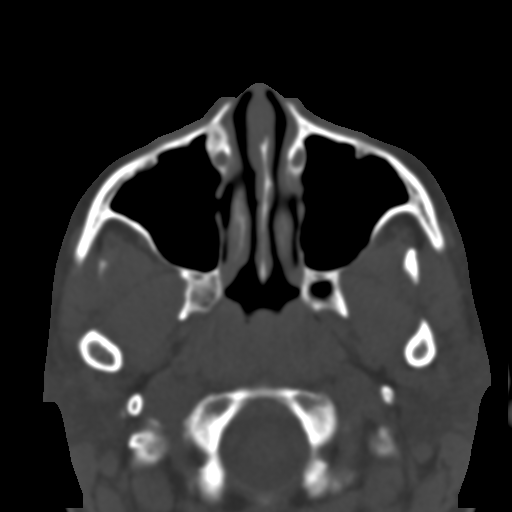
[im 48/74  bone]
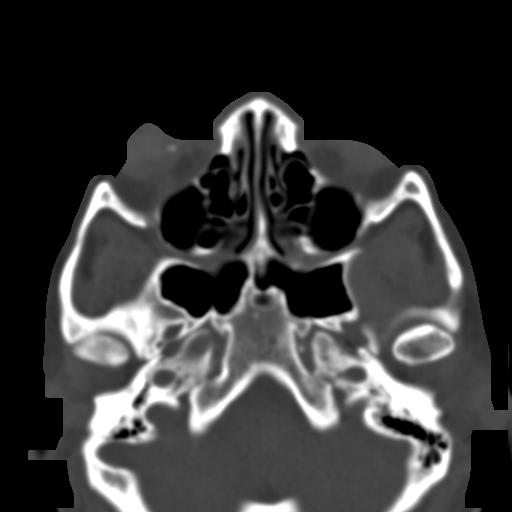
[im 53/74  bone]
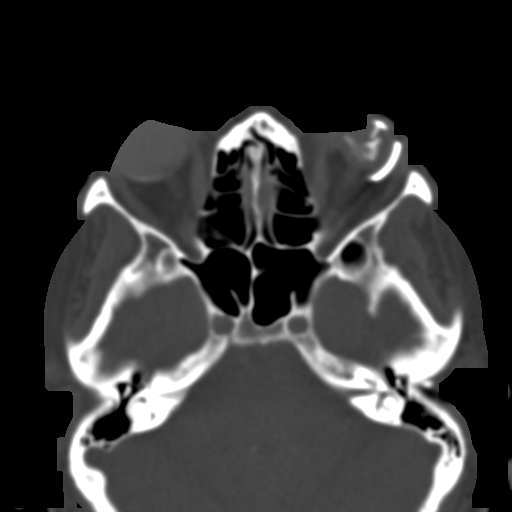
[im 56/74  brain]
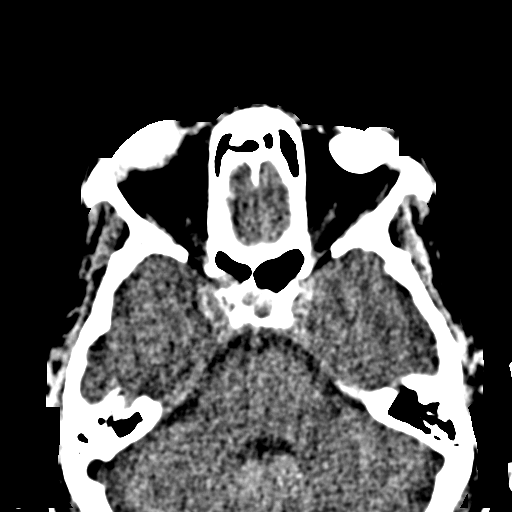
[im 56/74  bone]
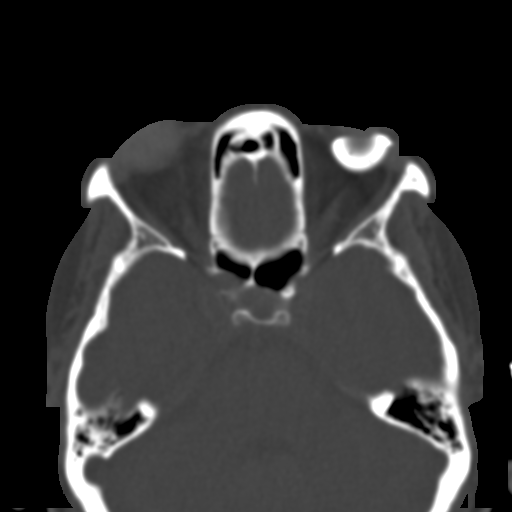
[im 61/74  bone]
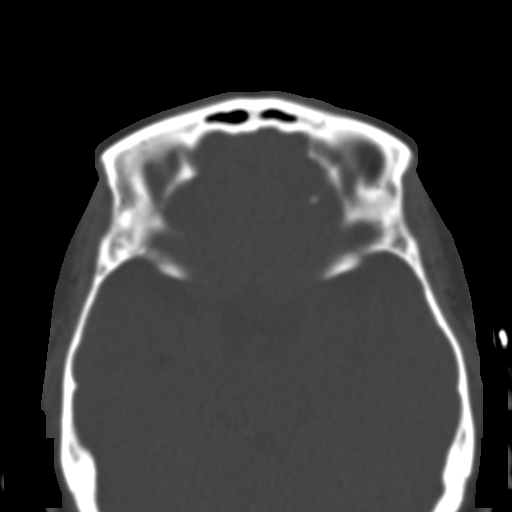
[im 66/74  bone]
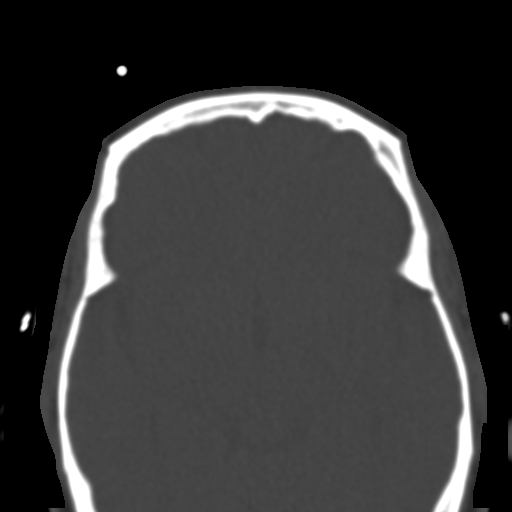
[im 71/74  bone]
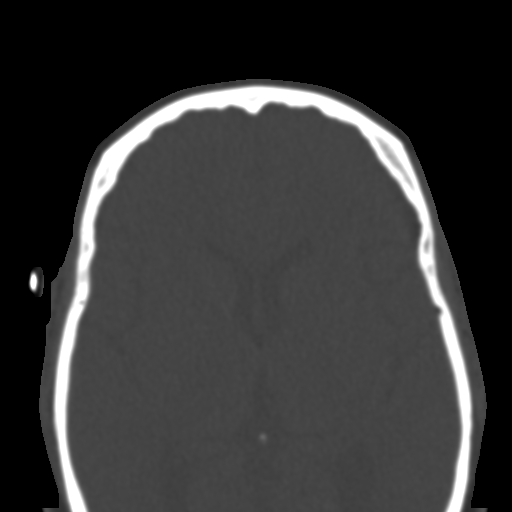

[16 of 30 positions shown; findings below may reference images not displayed]

FINDINGS: A left orbital implant is again demonstrated.  There is
also either chronic vitreous hemorrhage or right globe implant.  No
periorbital process is identified.  There is diffuse soft tissue
swelling involving the nose.  No focal abscess or destructive bony
process.  The paranasal sinuses and mastoid air cells are clear
except for a mucous retention cyst in the left maxillary sinus.

The visualized portion of the brain is unremarkable.
IMPRESSION: 1.  Diffuse soft tissue swelling involving the nose.
2.  Left maxillary mucous tension cyst.
3.  Orbital prosthesis.

## 2013-05-06 ENCOUNTER — Other Ambulatory Visit: Payer: Self-pay | Admitting: Internal Medicine

## 2013-05-18 ENCOUNTER — Encounter (HOSPITAL_COMMUNITY): Payer: Self-pay | Admitting: Emergency Medicine

## 2013-05-18 ENCOUNTER — Emergency Department (HOSPITAL_COMMUNITY)
Admission: EM | Admit: 2013-05-18 | Discharge: 2013-05-19 | Disposition: A | Discharge status: Home / Self Care | Payer: PRIVATE HEALTH INSURANCE | Attending: Emergency Medicine | Admitting: Emergency Medicine

## 2013-05-18 ENCOUNTER — Emergency Department (HOSPITAL_COMMUNITY): Payer: PRIVATE HEALTH INSURANCE

## 2013-05-18 DIAGNOSIS — M79609 Pain in unspecified limb: Secondary | ICD-10-CM | POA: Insufficient documentation

## 2013-05-18 DIAGNOSIS — Z794 Long term (current) use of insulin: Secondary | ICD-10-CM | POA: Insufficient documentation

## 2013-05-18 DIAGNOSIS — Z79899 Other long term (current) drug therapy: Secondary | ICD-10-CM | POA: Insufficient documentation

## 2013-05-18 DIAGNOSIS — E1149 Type 2 diabetes mellitus with other diabetic neurological complication: Secondary | ICD-10-CM | POA: Insufficient documentation

## 2013-05-18 DIAGNOSIS — Z880 Allergy status to penicillin: Secondary | ICD-10-CM | POA: Insufficient documentation

## 2013-05-18 DIAGNOSIS — I10 Essential (primary) hypertension: Secondary | ICD-10-CM | POA: Insufficient documentation

## 2013-05-18 DIAGNOSIS — M79604 Pain in right leg: Secondary | ICD-10-CM

## 2013-05-18 DIAGNOSIS — E785 Hyperlipidemia, unspecified: Secondary | ICD-10-CM | POA: Insufficient documentation

## 2013-05-18 DIAGNOSIS — E1142 Type 2 diabetes mellitus with diabetic polyneuropathy: Secondary | ICD-10-CM | POA: Insufficient documentation

## 2013-05-18 DIAGNOSIS — M549 Dorsalgia, unspecified: Secondary | ICD-10-CM | POA: Insufficient documentation

## 2013-05-18 DIAGNOSIS — Z87891 Personal history of nicotine dependence: Secondary | ICD-10-CM | POA: Insufficient documentation

## 2013-05-18 DIAGNOSIS — M7989 Other specified soft tissue disorders: Secondary | ICD-10-CM | POA: Insufficient documentation

## 2013-05-18 DIAGNOSIS — H548 Legal blindness, as defined in USA: Secondary | ICD-10-CM | POA: Insufficient documentation

## 2013-05-18 DIAGNOSIS — Z8739 Personal history of other diseases of the musculoskeletal system and connective tissue: Secondary | ICD-10-CM | POA: Insufficient documentation

## 2013-05-18 DIAGNOSIS — J45909 Unspecified asthma, uncomplicated: Secondary | ICD-10-CM | POA: Insufficient documentation

## 2013-05-18 DIAGNOSIS — E039 Hypothyroidism, unspecified: Secondary | ICD-10-CM | POA: Insufficient documentation

## 2013-05-18 DIAGNOSIS — M79605 Pain in left leg: Secondary | ICD-10-CM

## 2013-05-18 HISTORY — DX: Spinal stenosis, site unspecified: M48.00

## 2013-05-18 LAB — CBC WITH DIFFERENTIAL/PLATELET
Basophils Absolute: 0 10*3/uL (ref 0.0–0.1)
Basophils Relative: 0 % (ref 0–1)
EOS ABS: 0.1 10*3/uL (ref 0.0–0.7)
EOS PCT: 1 % (ref 0–5)
HCT: 33.6 % — ABNORMAL LOW (ref 36.0–46.0)
HEMOGLOBIN: 11.5 g/dL — AB (ref 12.0–15.0)
Lymphocytes Relative: 27 % (ref 12–46)
Lymphs Abs: 3 10*3/uL (ref 0.7–4.0)
MCH: 26.6 pg (ref 26.0–34.0)
MCHC: 34.2 g/dL (ref 30.0–36.0)
MCV: 77.8 fL — AB (ref 78.0–100.0)
MONOS PCT: 8 % (ref 3–12)
Monocytes Absolute: 0.9 10*3/uL (ref 0.1–1.0)
Neutro Abs: 7.2 10*3/uL (ref 1.7–7.7)
Neutrophils Relative %: 64 % (ref 43–77)
PLATELETS: 209 10*3/uL (ref 150–400)
RBC: 4.32 MIL/uL (ref 3.87–5.11)
RDW: 14.5 % (ref 11.5–15.5)
WBC: 11.6 10*3/uL — ABNORMAL HIGH (ref 4.0–10.5)

## 2013-05-18 LAB — POCT I-STAT, CHEM 8
BUN: 21 mg/dL (ref 6–23)
CALCIUM ION: 1.22 mmol/L (ref 1.12–1.23)
CREATININE: 0.9 mg/dL (ref 0.50–1.10)
Chloride: 108 mEq/L (ref 96–112)
Glucose, Bld: 112 mg/dL — ABNORMAL HIGH (ref 70–99)
HCT: 36 % (ref 36.0–46.0)
Hemoglobin: 12.2 g/dL (ref 12.0–15.0)
Potassium: 3.9 mEq/L (ref 3.7–5.3)
Sodium: 144 mEq/L (ref 137–147)
TCO2: 24 mmol/L (ref 0–100)

## 2013-05-18 LAB — D-DIMER, QUANTITATIVE: D-Dimer, Quant: 0.29 ug/mL-FEU (ref 0.00–0.48)

## 2013-05-18 MED ORDER — HYDROCODONE-ACETAMINOPHEN 5-325 MG PO TABS
1.0000 | ORAL_TABLET | Freq: Once | ORAL | Status: AC
  Administered 2013-05-18: 1 via ORAL
  Filled 2013-05-18: qty 1

## 2013-05-18 MED ORDER — HYDROCODONE-ACETAMINOPHEN 5-325 MG PO TABS
1.0000 | ORAL_TABLET | Freq: Once | ORAL | Status: AC
  Administered 2013-05-19: 1 via ORAL
  Filled 2013-05-18: qty 1

## 2013-05-18 NOTE — ED Notes (Signed)
Lab draw unsuccessful 

## 2013-05-18 NOTE — ED Notes (Signed)
Patient with history of bilateral leg pain above the knees for the last week.  Patient states she does have some shortness of breath today.  She states she had to wear her oxygen today, which she usually wears at night.  Patient denies chest pain.  Patient states that she thinks that her thighs are swollen, bigger than usual.  Patient is CAOx3 in triage.  Patient is blind.

## 2013-05-18 NOTE — ED Provider Notes (Signed)
CSN: [id number]     Arrival date & time 05/18/13  1920 History   First MD Initiated Contact with Patient 05/18/13 1958     Chief Complaint  Patient presents with  . Leg Pain    HPI Pt has been having trouble with swelling and pain in her legs bilaterally for the last week.  She has an aching pain that started about a week ago.  The pain is around the knees and up to the thighs.  She does have some trouble with neuropathy but feels this is different.  She also has felt like her breathing has been a little worse than usual.  She used some of her oxygen that she uses at night during the day.  No fevers.  No chest pain. Past Medical History  Diagnosis Date  . Asthma   . Diabetes mellitus   . Hypertension   . Blind   . Hypothyroidism   . Hyperlipidemia   . Diabetic neuropathy   . Blindness and low vision     left eye glass eye,  legally blind in right eye  . Spinal stenosis    Past Surgical History  Procedure Laterality Date  . Cesarean section      x 2  . Abdominal hysterectomy    . Cholecystectomy    . Enucleation     Family History  Problem Relation Age of Onset  . Diabetes Sister    History  Substance Use Topics  . Smoking status: Former Smoker    Quit date: 05/22/1992  . Smokeless tobacco: Never Used  . Alcohol Use: No   OB History   Grav Para Term Preterm Abortions TAB SAB Ect Mult Living                 Obstetric Comments   Pt has two sons.     Review of Systems  Constitutional: Negative for fever.  Respiratory: Negative for cough.   Cardiovascular: Negative for chest pain.  Musculoskeletal: Positive for back pain.  All other systems reviewed and are negative.    Allergies  Codeine; Iohexol; Penicillins; and Sulfa antibiotics  Home Medications   Current Outpatient Rx  Name  Route  Sig  Dispense  Refill  . albuterol (PROVENTIL HFA;VENTOLIN HFA) 108 (90 BASE) MCG/ACT inhaler   Inhalation   Inhale 2 puffs into the lungs every 4 (four) hours as needed.  For shortness of breath         . cyclobenzaprine (FLEXERIL) 10 MG tablet   Oral   Take 1 tablet (10 mg total) by mouth 3 (three) times daily as needed for muscle spasms.   30 tablet   0   . docusate sodium (COLACE) 100 MG capsule   Oral   Take 100 mg by mouth daily.         Marland Kitchen esomeprazole (NEXIUM) 40 MG capsule   Oral   Take 40 mg by mouth daily at 12 noon.         . insulin aspart (NOVOLOG) 100 UNIT/ML injection   Subcutaneous   Inject 5 Units into the skin 3 (three) times daily before meals. DO Not take this if CBG <120   1 vial   0   . Insulin Detemir (LEVEMIR FLEXPEN) 100 UNIT/ML SOPN   Subcutaneous   Inject 40 Units into the skin at bedtime.   3 pen   10   . levothyroxine (SYNTHROID, LEVOTHROID) 137 MCG tablet   Oral   Take 1 tablet (137  mcg total) by mouth every morning.   90 tablet   0   . Linaclotide (LINZESS) 145 MCG CAPS capsule   Oral   Take 145 mcg by mouth every morning.         . Menthol, Topical Analgesic, (ICY HOT EX)   Apply externally   Apply 1 application topically at bedtime. Apply to back         . midodrine (PROAMATINE) 5 MG tablet   Oral   Take 5 mg by mouth every morning.         . montelukast (SINGULAIR) 10 MG tablet   Oral   Take 1 tablet (10 mg total) by mouth at bedtime.   90 tablet   0   . potassium chloride SA (K-DUR,KLOR-CON) 20 MEQ tablet      TAKE 1 TABLET BY MOUTH EVERY DAY   30 tablet   0   . pregabalin (LYRICA) 75 MG capsule   Oral   Take 75 mg by mouth 3 (three) times daily.          . simvastatin (ZOCOR) 20 MG tablet   Oral   Take 20 mg by mouth every morning.           BP 125/60  Pulse 76  Temp(Src) 97.6 F (36.4 C) (Oral)  Resp 18  SpO2 100% Physical Exam  Nursing note and vitals reviewed. Constitutional: She appears well-developed and well-nourished. No distress.  HENT:  Head: Normocephalic and atraumatic.  Right Ear: External ear normal.  Left Ear: External ear normal.  Eyes:  Conjunctivae are normal. Right eye exhibits no discharge. Left eye exhibits no discharge. No scleral icterus.  Neck: Neck supple. No tracheal deviation present.  Cardiovascular: Normal rate, regular rhythm and intact distal pulses.   Pulmonary/Chest: Effort normal and breath sounds normal. No stridor. No respiratory distress. She has no wheezes. She has no rales.  Abdominal: Soft. Bowel sounds are normal. She exhibits no distension. There is no tenderness. There is no rebound and no guarding.  Musculoskeletal: She exhibits tenderness. She exhibits no edema.  No erythema, nl pulses, mild ttp right mid thigh  Neurological: She is alert. She has normal strength. No sensory deficit. Cranial nerve deficit:  no gross defecits noted. She exhibits normal muscle tone. She displays no seizure activity. Coordination normal.  Skin: Skin is warm and dry. No rash noted.  Psychiatric: She has a normal mood and affect.    ED Course  Procedures (including critical care time) Labs Review Labs Reviewed  CBC WITH DIFFERENTIAL - Abnormal; Notable for the following:    WBC 11.6 (*)    Hemoglobin 11.5 (*)    HCT 33.6 (*)    MCV 77.8 (*)    All other components within normal limits  POCT I-STAT, CHEM 8 - Abnormal; Notable for the following:    Glucose, Bld 112 (*)    All other components within normal limits  D-DIMER, QUANTITATIVE   Imaging Review Dg Chest 2 View  05/18/2013   CLINICAL DATA:  Shortness of breath.  Asthma.  EXAM: CHEST  2 VIEW  COMPARISON:  12/30/2011  FINDINGS: The heart size and mediastinal contours are within normal limits. Both lungs are clear. The visualized skeletal structures are unremarkable.  IMPRESSION: No active cardiopulmonary disease.   Electronically Signed   By: Earle Gell M.D.   On: 05/18/2013 20:48    EKG Interpretation   None       MDM   1. Bilateral leg  pain    Pt exam is reassuring.  No edema or erythema.  Labs are unremarkable.  Doubt DVT, CHF.  Pt does have  neuropathy.  It is possible the symptoms may be related to that.  At this time there does not appear to be any evidence of an acute emergency medical condition and the patient appears stable for discharge with appropriate outpatient follow up.     Kathalene Frames, MD 05/18/13 [phone]

## 2013-05-18 NOTE — ED Notes (Signed)
This RN attempted to draw blood and was unsuccessful.

## 2013-06-03 ENCOUNTER — Other Ambulatory Visit: Payer: Self-pay | Admitting: Internal Medicine

## 2013-06-10 DIAGNOSIS — L669 Cicatricial alopecia, unspecified: Secondary | ICD-10-CM | POA: Insufficient documentation

## 2013-06-10 DIAGNOSIS — L281 Prurigo nodularis: Secondary | ICD-10-CM | POA: Insufficient documentation

## 2013-06-12 ENCOUNTER — Encounter: Payer: Self-pay | Admitting: Podiatrist

## 2013-06-12 ENCOUNTER — Ambulatory Visit (INDEPENDENT_AMBULATORY_CARE_PROVIDER_SITE_OTHER): Payer: Medicare Other | Admitting: Podiatrist

## 2013-06-12 VITALS — BP 115/68 | HR 82 | Resp 16

## 2013-06-12 DIAGNOSIS — E1149 Type 2 diabetes mellitus with other diabetic neurological complication: Secondary | ICD-10-CM

## 2013-06-12 DIAGNOSIS — E1140 Type 2 diabetes mellitus with diabetic neuropathy, unspecified: Secondary | ICD-10-CM

## 2013-06-12 DIAGNOSIS — M79609 Pain in unspecified limb: Secondary | ICD-10-CM

## 2013-06-12 DIAGNOSIS — B351 Tinea unguium: Secondary | ICD-10-CM

## 2013-06-12 DIAGNOSIS — E1142 Type 2 diabetes mellitus with diabetic polyneuropathy: Secondary | ICD-10-CM

## 2013-06-12 NOTE — Patient Instructions (Signed)
Diabetes and Foot Care Diabetes may cause you to have problems because of poor blood supply (circulation) to your feet and legs. This may cause the skin on your feet to become thinner, break easier, and heal more slowly. Your skin may become dry, and the skin may peel and crack. You may also have nerve damage in your legs and feet causing decreased feeling in them. You may not notice minor injuries to your feet that could lead to infections or more serious problems. Taking care of your feet is one of the most important things you can do for yourself.  HOME CARE INSTRUCTIONS  Wear shoes at all times, even in the house. Do not go barefoot. Bare feet are easily injured.  Check your feet daily for blisters, cuts, and redness. If you cannot see the bottom of your feet, use a mirror or ask someone for help.  Wash your feet with warm water (do not use hot water) and mild soap. Then pat your feet and the areas between your toes until they are completely dry. Do not soak your feet as this can dry your skin.  Apply a moisturizing lotion or petroleum jelly (that does not contain alcohol and is unscented) to the skin on your feet and to dry, brittle toenails. Do not apply lotion between your toes.  Trim your toenails straight across. Do not dig under them or around the cuticle. File the edges of your nails with an emery board or nail file.  Do not cut corns or calluses or try to remove them with medicine.  Wear clean socks or stockings every day. Make sure they are not too tight. Do not wear knee-high stockings since they may decrease blood flow to your legs.  Wear shoes that fit properly and have enough cushioning. To break in new shoes, wear them for just a few hours a day. This prevents you from injuring your feet. Always look in your shoes before you put them on to be sure there are no objects inside.  Do not cross your legs. This may decrease the blood flow to your feet.  If you find a minor scrape,  cut, or break in the skin on your feet, keep it and the skin around it clean and dry. These areas may be cleansed with mild soap and water. Do not cleanse the area with peroxide, alcohol, or iodine.  When you remove an adhesive bandage, be sure not to damage the skin around it.  If you have a wound, look at it several times a day to make sure it is healing.  Do not use heating pads or hot water bottles. They may burn your skin. If you have lost feeling in your feet or legs, you may not know it is happening until it is too late.  Make sure your health care provider performs a complete foot exam at least annually or more often if you have foot problems. Report any cuts, sores, or bruises to your health care provider immediately. SEEK MEDICAL CARE IF:   You have an injury that is not healing.  You have cuts or breaks in the skin.  You have an ingrown nail.  You notice redness on your legs or feet.  You feel burning or tingling in your legs or feet.  You have pain or cramps in your legs and feet.  Your legs or feet are numb.  Your feet always feel cold. SEEK IMMEDIATE MEDICAL CARE IF:   There is increasing redness,   swelling, or pain in or around a wound.  There is a red line that goes up your leg.  Pus is coming from a wound.  You develop a fever or as directed by your health care provider.  You notice a bad smell coming from an ulcer or wound. Document Released: 04/29/2000 Document Revised: 01/02/2013 Document Reviewed: 10/09/2012 [employer] Patient Information 2014 [employer].  

## 2013-06-12 NOTE — Progress Notes (Signed)
Subjective: Patient presents today for continued diabetic foot and nail care. The patient is blind and She relates that she needs new diabetic shoes and with like to see about getting a pair. She also relates neuropathy due to diabetes which is stable at this time. Tingling and numbness are reported per the patient.   Objective: Vascular status continues to be intact with palpable pedal pulses at two out of four DP and PT bilateral.  Normal proximal to distal cooling is noted bilateral. Neurological sensation is decreased via Semmes Weinstein monofilament at 2/5 sites bilateral. Light touch, vibration, are also decreased as well bilateral. Patient's toenails are elongated, thickened, with ingrown deformity present and discomfort with palpation and debridement. Musculoskeletal examination reveals mildly pronated foot type. Range of motion of first metatarsophalangeal joint is adequate. No contracture of digits is seen.   Assessment: Diabetes with neuropathy, symptomatic mycotic toenails   Plan: Agreed that with her diabetes and her neuropathy she would benefit from some diabetic shoes for ulcer prevention. and we will contact Dr. Durward Fortes at [facility] to see if he would like for her to have these. I debrided her toenails without complication and she'll be seen back for routine appointments in 3 months. If she has any problems or concerns prior that visit she is instructed to call me immediately.

## 2013-06-21 ENCOUNTER — Encounter (HOSPITAL_COMMUNITY): Payer: Self-pay | Admitting: Emergency Medicine

## 2013-06-21 ENCOUNTER — Emergency Department (HOSPITAL_COMMUNITY)
Admission: EM | Admit: 2013-06-21 | Discharge: 2013-06-21 | Disposition: A | Discharge status: Home / Self Care | Payer: PRIVATE HEALTH INSURANCE | Attending: Emergency Medicine | Admitting: Emergency Medicine

## 2013-06-21 DIAGNOSIS — E039 Hypothyroidism, unspecified: Secondary | ICD-10-CM | POA: Insufficient documentation

## 2013-06-21 DIAGNOSIS — E1142 Type 2 diabetes mellitus with diabetic polyneuropathy: Secondary | ICD-10-CM | POA: Insufficient documentation

## 2013-06-21 DIAGNOSIS — Z79899 Other long term (current) drug therapy: Secondary | ICD-10-CM | POA: Insufficient documentation

## 2013-06-21 DIAGNOSIS — J45909 Unspecified asthma, uncomplicated: Secondary | ICD-10-CM | POA: Insufficient documentation

## 2013-06-21 DIAGNOSIS — Z880 Allergy status to penicillin: Secondary | ICD-10-CM | POA: Insufficient documentation

## 2013-06-21 DIAGNOSIS — R197 Diarrhea, unspecified: Secondary | ICD-10-CM

## 2013-06-21 DIAGNOSIS — E785 Hyperlipidemia, unspecified: Secondary | ICD-10-CM | POA: Insufficient documentation

## 2013-06-21 DIAGNOSIS — Z794 Long term (current) use of insulin: Secondary | ICD-10-CM | POA: Insufficient documentation

## 2013-06-21 DIAGNOSIS — I10 Essential (primary) hypertension: Secondary | ICD-10-CM | POA: Insufficient documentation

## 2013-06-21 DIAGNOSIS — Z87891 Personal history of nicotine dependence: Secondary | ICD-10-CM | POA: Insufficient documentation

## 2013-06-21 DIAGNOSIS — Z8739 Personal history of other diseases of the musculoskeletal system and connective tissue: Secondary | ICD-10-CM | POA: Insufficient documentation

## 2013-06-21 DIAGNOSIS — R110 Nausea: Secondary | ICD-10-CM | POA: Insufficient documentation

## 2013-06-21 DIAGNOSIS — IMO0002 Reserved for concepts with insufficient information to code with codable children: Secondary | ICD-10-CM | POA: Insufficient documentation

## 2013-06-21 DIAGNOSIS — E860 Dehydration: Secondary | ICD-10-CM

## 2013-06-21 DIAGNOSIS — E1149 Type 2 diabetes mellitus with other diabetic neurological complication: Secondary | ICD-10-CM | POA: Insufficient documentation

## 2013-06-21 DIAGNOSIS — H5440 Blindness, one eye, unspecified eye: Secondary | ICD-10-CM | POA: Insufficient documentation

## 2013-06-21 LAB — CBC
HCT: 40 % (ref 36.0–46.0)
Hemoglobin: 13.7 g/dL (ref 12.0–15.0)
MCH: 26.7 pg (ref 26.0–34.0)
MCHC: 34.3 g/dL (ref 30.0–36.0)
MCV: 77.8 fL — ABNORMAL LOW (ref 78.0–100.0)
Platelets: 190 K/uL (ref 150–400)
RBC: 5.14 MIL/uL — ABNORMAL HIGH (ref 3.87–5.11)
RDW: 14.1 % (ref 11.5–15.5)
WBC: 8.4 K/uL (ref 4.0–10.5)

## 2013-06-21 LAB — POCT I-STAT TROPONIN I: Troponin i, poc: 0.01 ng/mL (ref 0.00–0.08)

## 2013-06-21 LAB — BASIC METABOLIC PANEL WITH GFR
BUN: 23 mg/dL (ref 6–23)
CO2: 27 meq/L (ref 19–32)
Calcium: 9.6 mg/dL (ref 8.4–10.5)
Chloride: 101 meq/L (ref 96–112)
Creatinine, Ser: 0.94 mg/dL (ref 0.50–1.10)
GFR calc Af Amer: 78 mL/min — ABNORMAL LOW
GFR calc non Af Amer: 68 mL/min — ABNORMAL LOW
Glucose, Bld: 95 mg/dL (ref 70–99)
Potassium: 3.9 meq/L (ref 3.7–5.3)
Sodium: 142 meq/L (ref 137–147)

## 2013-06-21 LAB — CG4 I-STAT (LACTIC ACID): Lactic Acid, Venous: 1 mmol/L (ref 0.5–2.2)

## 2013-06-21 MED ORDER — SODIUM CHLORIDE 0.9 % IV BOLUS (SEPSIS)
1000.0000 mL | Freq: Once | INTRAVENOUS | Status: AC
  Administered 2013-06-21: 1000 mL via INTRAVENOUS

## 2013-06-21 MED ORDER — ONDANSETRON HCL 4 MG/2ML IJ SOLN
4.0000 mg | Freq: Once | INTRAMUSCULAR | Status: AC
  Administered 2013-06-21: 4 mg via INTRAVENOUS
  Filled 2013-06-21: qty 2

## 2013-06-21 NOTE — ED Notes (Signed)
Pt. Ambulated to the bathroom, with assist of 2.  Pt. Became dizzy and stumbled while coming back to room.  Dr. Mingo Amber aware.  Pt. Placed back into bed.  Denies any dizziness.   Vitals stable.

## 2013-06-21 NOTE — ED Notes (Signed)
Lactic acid results shown to dr. Mingo Amber and nurse Benjamine Mola

## 2013-06-21 NOTE — ED Notes (Signed)
Lunch tray ordered 

## 2013-06-21 NOTE — ED Provider Notes (Signed)
CSN: [id number]     Arrival date & time 06/21/13  0957 History   First MD Initiated Contact with Patient 06/21/13 1000     Chief Complaint  Patient presents with  . Dizziness   (Consider location/radiation/quality/duration/timing/severity/associated sxs/prior Treatment) Patient is a 53 y.o. female presenting with dizziness. The history is provided by the patient.  Dizziness Quality:  Lightheadedness Severity:  Moderate Onset quality:  Sudden Timing:  Intermittent Progression:  Worsening Chronicity:  New Context: standing up   Relieved by:  Nothing Worsened by:  Nothing tried Ineffective treatments:  None tried Associated symptoms: diarrhea and nausea   Associated symptoms: no shortness of breath and no vomiting   Associated symptoms comment:  Moderate diarrhea   Past Medical History  Diagnosis Date  . Asthma   . Diabetes mellitus   . Hypertension   . Blind   . Hypothyroidism   . Hyperlipidemia   . Diabetic neuropathy   . Blindness and low vision     left eye glass eye,  legally blind in right eye  . Spinal stenosis    Past Surgical History  Procedure Laterality Date  . Cesarean section      x 2  . Abdominal hysterectomy    . Cholecystectomy    . Enucleation     Family History  Problem Relation Age of Onset  . Diabetes Sister    History  Substance Use Topics  . Smoking status: Former Smoker    Quit date: 05/22/1992  . Smokeless tobacco: Never Used  . Alcohol Use: No   OB History   Grav Para Term Preterm Abortions TAB SAB Ect Mult Living                 Obstetric Comments   Pt has two sons.     Review of Systems  Constitutional: Negative for fever.  Respiratory: Negative for cough and shortness of breath.   Gastrointestinal: Positive for nausea and diarrhea. Negative for vomiting.  Neurological: Positive for dizziness. Negative for syncope.  All other systems reviewed and are negative.    Allergies  Codeine; Iohexol; Penicillins; and Sulfa  antibiotics  Home Medications   Current Outpatient Rx  Name  Route  Sig  Dispense  Refill  . albuterol (PROVENTIL HFA;VENTOLIN HFA) 108 (90 BASE) MCG/ACT inhaler   Inhalation   Inhale 2 puffs into the lungs every 4 (four) hours as needed. For shortness of breath         . cyclobenzaprine (FLEXERIL) 10 MG tablet      TAKE 1 TABLET BY MOUTH THREE TIMES A DAY AS NEEDED   90 tablet   0   . docusate sodium (COLACE) 100 MG capsule   Oral   Take 100 mg by mouth daily.         Marland Kitchen esomeprazole (NEXIUM) 40 MG capsule   Oral   Take 40 mg by mouth daily at 12 noon.         . insulin aspart (NOVOLOG) 100 UNIT/ML injection   Subcutaneous   Inject 5 Units into the skin 3 (three) times daily before meals. DO Not take this if CBG <120   1 vial   0   . Insulin Detemir (LEVEMIR FLEXPEN) 100 UNIT/ML SOPN   Subcutaneous   Inject 40 Units into the skin at bedtime.   3 pen   10   . levothyroxine (SYNTHROID, LEVOTHROID) 137 MCG tablet   Oral   Take 1 tablet (137 mcg total) by mouth  every morning.   90 tablet   0   . Linaclotide (LINZESS) 145 MCG CAPS capsule   Oral   Take 145 mcg by mouth every morning.         . Menthol, Topical Analgesic, (ICY HOT EX)   Apply externally   Apply 1 application topically at bedtime. Apply to back         . midodrine (PROAMATINE) 5 MG tablet   Oral   Take 5 mg by mouth every morning.         . montelukast (SINGULAIR) 10 MG tablet   Oral   Take 1 tablet (10 mg total) by mouth at bedtime.   90 tablet   0   . potassium chloride SA (K-DUR,KLOR-CON) 20 MEQ tablet      TAKE 1 TABLET BY MOUTH EVERY DAY   30 tablet   0   . pregabalin (LYRICA) 75 MG capsule   Oral   Take 75 mg by mouth 3 (three) times daily.          . simvastatin (ZOCOR) 20 MG tablet   Oral   Take 20 mg by mouth every morning.          . triamcinolone cream (KENALOG) 0.1 %      APPLY TO AFFECTED AREA TWICE DAILY   15 g   0    BP 107/76  Pulse 77   Temp(Src) 97.8 F (36.6 C) (Oral)  Resp 13  SpO2 100% Physical Exam  Nursing note and vitals reviewed. Constitutional: She is oriented to person, place, and time. She appears well-developed and well-nourished. No distress.  HENT:  Head: Normocephalic and atraumatic.  Eyes: EOM are normal. Pupils are equal, round, and reactive to light.  Neck: Normal range of motion. Neck supple.  Cardiovascular: Normal rate and regular rhythm.  Exam reveals no friction rub.   No murmur heard. Pulmonary/Chest: Effort normal and breath sounds normal. No respiratory distress. She has no wheezes. She has no rales.  Abdominal: Soft. She exhibits no distension. There is no tenderness. There is no rebound.  Musculoskeletal: Normal range of motion. She exhibits no edema.  Neurological: She is alert and oriented to person, place, and time.  Skin: No rash noted. She is not diaphoretic.    ED Course  Procedures (including critical care time) Labs Review Labs Reviewed  CBC - Abnormal; Notable for the following:    RBC 5.14 (*)    MCV 77.8 (*)    All other components within normal limits  BASIC METABOLIC PANEL - Abnormal; Notable for the following:    GFR calc non Af Amer 68 (*)    GFR calc Af Amer 78 (*)    All other components within normal limits  POCT I-STAT TROPONIN I  CG4 I-STAT (LACTIC ACID)   Imaging Review No results found.  EKG Interpretation   None      Date: 06/21/2013  Rate: 78  Rhythm: normal sinus rhythm  QRS Axis: normal  Intervals: normal  ST/T Wave abnormalities: normal  Conduction Disutrbances:none  Narrative Interpretation:   Old EKG Reviewed: unchanged    MDM   1. Dehydration   2. Diarrhea    40F with hx of diabetes, HTN, blindness presents with low BP. BP taken at home, 80/35. Having diarrhea for the past 12 hours. No fevers, no vomiting. No chest pain or SOB. No blood in her diarrhea.  Also complaining of orthostatic dizziness. Patient here with stable vitals.  Belly benign, lungs  clear. Will given fluids, check labs. Dr[facility] Ardeth [facility] at [facility] asked patient to come to the ED, will speak with him once labs return.  Dr. Ardeth Perfect spoke to me on the phone, checking on the patient. She is orthostatic, otherwise vitals well. Another liter of fluid given, Dr. Ardeth Perfect states he can f/u in 3 days. He states admission for fluids not warranted if she can continue drinking on her own. I agree with this.  She is tolerating PO, feeling better after 2 L NS. STable for discharge.  Osvaldo Shipper, MD 06/21/13 [phone]

## 2013-06-21 NOTE — ED Notes (Signed)
Pt states that she checks her bp and it was low  She has been dizzy ,pt is blind, and feels faint

## 2013-06-21 NOTE — ED Notes (Signed)
Pt ambulated to bathroom with assistance.

## 2013-06-21 NOTE — Discharge Instructions (Signed)
Dehydration, Adult °Dehydration is when you lose more fluids from the body than you take in. Vital organs like the kidneys, brain, and heart cannot function without a proper amount of fluids and salt. Any loss of fluids from the body can cause dehydration.  °CAUSES  °· Vomiting. °· Diarrhea. °· Excessive sweating. °· Excessive urine output. °· Fever. °SYMPTOMS  °Mild dehydration °· Thirst. °· Dry lips. °· Slightly dry mouth. °Moderate dehydration °· Very dry mouth. °· Sunken eyes. °· Skin does not bounce back quickly when lightly pinched and released. °· Dark urine and decreased urine production. °· Decreased tear production. °· Headache. °Severe dehydration °· Very dry mouth. °· Extreme thirst. °· Rapid, weak pulse (more than 100 beats per minute at rest). °· Cold hands and feet. °· Not able to sweat in spite of heat and temperature. °· Rapid breathing. °· Blue lips. °· Confusion and lethargy. °· Difficulty being awakened. °· Minimal urine production. °· No tears. °DIAGNOSIS  °Your caregiver will diagnose dehydration based on your symptoms and your exam. Blood and urine tests will help confirm the diagnosis. The diagnostic evaluation should also identify the cause of dehydration. °TREATMENT  °Treatment of mild or moderate dehydration can often be done at home by increasing the amount of fluids that you drink. It is best to drink small amounts of fluid more often. Drinking too much at one time can make vomiting worse. Refer to the home care instructions below. °Severe dehydration needs to be treated at the hospital where you will probably be given intravenous (IV) fluids that contain water and electrolytes. °HOME CARE INSTRUCTIONS  °· Ask your caregiver about specific rehydration instructions. °· Drink enough fluids to keep your urine clear or pale yellow. °· Drink small amounts frequently if you have nausea and vomiting. °· Eat as you normally do. °· Avoid: °· Foods or drinks high in sugar. °· Carbonated  drinks. °· Juice. °· Extremely hot or cold fluids. °· Drinks with caffeine. °· Fatty, greasy foods. °· Alcohol. °· Tobacco. °· Overeating. °· Gelatin desserts. °· Wash your hands well to avoid spreading bacteria and viruses. °· Only take over-the-counter or prescription medicines for pain, discomfort, or fever as directed by your caregiver. °· Ask your caregiver if you should continue all prescribed and over-the-counter medicines. °· Keep all follow-up appointments with your caregiver. °SEEK MEDICAL CARE IF: °· You have abdominal pain and it increases or stays in one area (localizes). °· You have a rash, stiff neck, or severe headache. °· You are irritable, sleepy, or difficult to awaken. °· You are weak, dizzy, or extremely thirsty. °SEEK IMMEDIATE MEDICAL CARE IF:  °· You are unable to keep fluids down or you get worse despite treatment. °· You have frequent episodes of vomiting or diarrhea. °· You have blood or green matter (bile) in your vomit. °· You have blood in your stool or your stool looks black and tarry. °· You have not urinated in 6 to 8 hours, or you have only urinated a small amount of very dark urine. °· You have a fever. °· You faint. °MAKE SURE YOU:  °· Understand these instructions. °· Will watch your condition. °· Will get help right away if you are not doing well or get worse. °Document Released: 05/02/2005 Document Revised: 07/25/2011 Document Reviewed: 12/20/2010 °ExitCare® Patient Information ©2014 [employer]. ° °Diarrhea °Diarrhea is frequent loose and watery bowel movements. It can cause you to feel weak and dehydrated. Dehydration can cause you to become tired and thirsty,   have a dry mouth, and have decreased urination that often is dark yellow. Diarrhea is a sign of another problem, most often an infection that will not last long. In most cases, diarrhea typically lasts 2 3 days. However, it can last longer if it is a sign of something more serious. It is important to treat your  diarrhea as directed by your caregive to lessen or prevent future episodes of diarrhea. °CAUSES  °Some common causes include: °· Gastrointestinal infections caused by viruses, bacteria, or parasites. °· Food poisoning or food allergies. °· Certain medicines, such as antibiotics, chemotherapy, and laxatives. °· Artificial sweeteners and fructose. °· Digestive disorders. °HOME CARE INSTRUCTIONS °· Ensure adequate fluid intake (hydration): have 1 cup (8 oz) of fluid for each diarrhea episode. Avoid fluids that contain simple sugars or sports drinks, fruit juices, whole milk products, and sodas. Your urine should be clear or pale yellow if you are drinking enough fluids. Hydrate with an oral rehydration solution that you can purchase at pharmacies, retail stores, and online. You can prepare an oral rehydration solution at home by mixing the following ingredients together: °·   tsp table salt. °· ¾ tsp baking soda. °·  tsp salt substitute containing potassium chloride. °· 1  tablespoons sugar. °· 1 L (34 oz) of water. °· Certain foods and beverages may increase the speed at which food moves through the gastrointestinal (GI) tract. These foods and beverages should be avoided and include: °· Caffeinated and alcoholic beverages. °· High-fiber foods, such as raw fruits and vegetables, nuts, seeds, and whole grain breads and cereals. °· Foods and beverages sweetened with sugar alcohols, such as xylitol, sorbitol, and mannitol. °· Some foods may be well tolerated and may help thicken stool including: °· Starchy foods, such as rice, toast, pasta, low-sugar cereal, oatmeal, grits, baked potatoes, crackers, and bagels. °· Bananas. °· Applesauce. °· Add probiotic-rich foods to help increase healthy bacteria in the GI tract, such as yogurt and fermented milk products. °· Wash your hands well after each diarrhea episode. °· Only take over-the-counter or prescription medicines as directed by your caregiver. °· Take a warm bath to  relieve any burning or pain from frequent diarrhea episodes. °SEEK IMMEDIATE MEDICAL CARE IF:  °· You are unable to keep fluids down. °· You have persistent vomiting. °· You have blood in your stool, or your stools are black and tarry. °· You do not urinate in 6 8 hours, or there is only a small amount of very dark urine. °· You have abdominal pain that increases or localizes. °· You have weakness, dizziness, confusion, or lightheadedness. °· You have a severe headache. °· Your diarrhea gets worse or does not get better. °· You have a fever or persistent symptoms for more than 2 3 days. °· You have a fever and your symptoms suddenly get worse. °MAKE SURE YOU:  °· Understand these instructions. °· Will watch your condition. °· Will get help right away if you are not doing well or get worse. °Document Released: 04/22/2002 Document Revised: 04/18/2012 Document Reviewed: 01/08/2012 °ExitCare® Patient Information ©2014 [employer]. ° °

## 2013-06-21 NOTE — ED Notes (Signed)
Called pt. 's friend  Davy Pique [phone] and left a message to call back at [phone]

## 2013-06-28 ENCOUNTER — Other Ambulatory Visit: Payer: Self-pay | Admitting: Internal Medicine

## 2013-06-28 IMAGING — CR DG ABDOMEN 1V
1 series · 1 of 1 positions shown · non-contrast
Comparison: Chest and two views abdomen 11/26/2010.

CLINICAL DATA: Abdominal pain, nausea and vomiting.

ABDOMEN - 1 VIEW

[view not recorded]
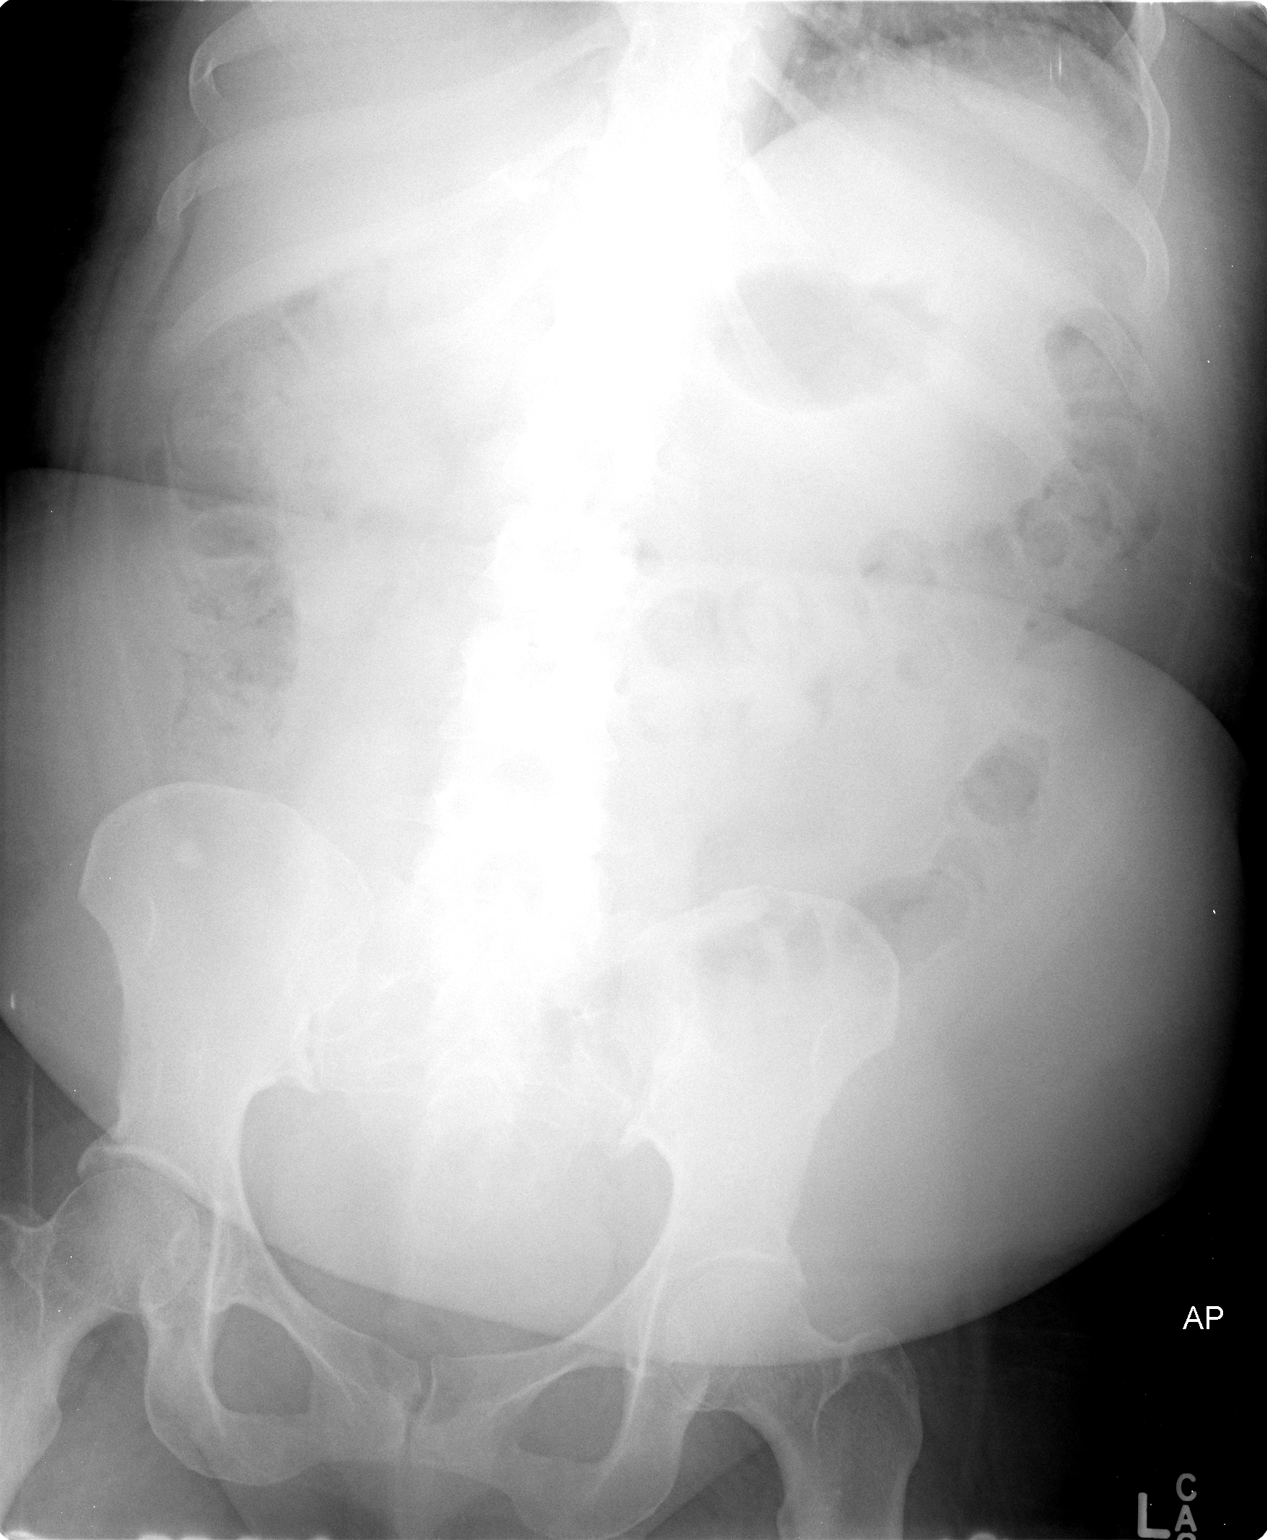

[1 of 1 positions shown; findings below may reference images not displayed]

FINDINGS: No evidence of small bowel obstruction is identified.
Moderate stool burden diffusely is noted.  Calcified lesion in the
right ilium is compatible with a bone island, unchanged.
IMPRESSION: No acute finding.

## 2013-07-19 ENCOUNTER — Ambulatory Visit (INDEPENDENT_AMBULATORY_CARE_PROVIDER_SITE_OTHER): Payer: Medicare Other | Admitting: *Deleted

## 2013-07-19 DIAGNOSIS — E1142 Type 2 diabetes mellitus with diabetic polyneuropathy: Secondary | ICD-10-CM

## 2013-07-19 DIAGNOSIS — E1140 Type 2 diabetes mellitus with diabetic neuropathy, unspecified: Secondary | ICD-10-CM

## 2013-07-19 DIAGNOSIS — E1149 Type 2 diabetes mellitus with other diabetic neurological complication: Secondary | ICD-10-CM

## 2013-07-19 NOTE — Progress Notes (Signed)
   Subjective:    Patient ID: Amy [name], female    DOB: July 30, 1960, 53 y.o.   MRN: [name]  HPI DIABETIC SHOE MEASUREMENT.   Review of Systems     Objective:   Physical Exam        Assessment & Plan:

## 2013-08-01 ENCOUNTER — Other Ambulatory Visit: Payer: Self-pay | Admitting: Internal Medicine

## 2013-08-30 ENCOUNTER — Ambulatory Visit: Payer: Medicare Other | Admitting: Podiatrist

## 2013-09-04 ENCOUNTER — Other Ambulatory Visit: Payer: Self-pay | Admitting: Internal Medicine

## 2013-09-06 ENCOUNTER — Ambulatory Visit (INDEPENDENT_AMBULATORY_CARE_PROVIDER_SITE_OTHER): Payer: PRIVATE HEALTH INSURANCE | Admitting: Podiatrist

## 2013-09-06 ENCOUNTER — Encounter: Payer: Self-pay | Admitting: Podiatrist

## 2013-09-06 VITALS — BP 121/73 | HR 88 | Resp 12

## 2013-09-06 DIAGNOSIS — M79609 Pain in unspecified limb: Secondary | ICD-10-CM

## 2013-09-06 DIAGNOSIS — E1142 Type 2 diabetes mellitus with diabetic polyneuropathy: Secondary | ICD-10-CM

## 2013-09-06 DIAGNOSIS — E1149 Type 2 diabetes mellitus with other diabetic neurological complication: Secondary | ICD-10-CM

## 2013-09-06 DIAGNOSIS — B351 Tinea unguium: Secondary | ICD-10-CM

## 2013-09-06 DIAGNOSIS — E1140 Type 2 diabetes mellitus with diabetic neuropathy, unspecified: Secondary | ICD-10-CM

## 2013-09-06 NOTE — Progress Notes (Signed)
   Subjective:    Patient ID: Amy [name], female    DOB: 01/21/1961, 53 y.o.   MRN: [id number]  HPI PICK UP DIABETIC SHOES AND GIVEN INSTRUCTION.    Review of Systems     Objective:   Physical Exam        Assessment & Plan:

## 2013-09-13 NOTE — Progress Notes (Signed)
Subjective: Patient presents today for continued diabetic foot and nail care. The patient is blind and She also presents to pick up her new diabetic shoes. She also relates neuropathy due to diabetes which is stable at this time. Tingling and numbness are reported per the patient.   Objective: Vascular status continues to be intact with palpable pedal pulses at two out of four DP and PT bilateral. Normal proximal to distal cooling is noted bilateral. Neurological sensation is decreased via Semmes Weinstein monofilament at 2/5 sites bilateral. Light touch, vibration, are also decreased as well bilateral. Patient's toenails are elongated, thickened, with ingrown deformity present and discomfort with palpation and debridement. Musculoskeletal examination reveals mildly pronated foot type. Range of motion of first metatarsophalangeal joint is adequate. No contracture of digits is seen.   Assessment: Diabetes with neuropathy, symptomatic mycotic toenails   Plan: Diabetic shoes were dispensed and noted to fit her foot well.  She states they feel comfortable and free of defect.   I debrided her toenails without complication and she'll be seen back for routine appointments in 3 months. If she has any problems or concerns prior that visit she is instructed to call me immediately

## 2013-09-15 IMAGING — CR DG CHEST 2V
2 series · 2 of 2 positions shown · non-contrast
Comparison: 03/11/2011

CLINICAL DATA: Chest pain, shortness of breath.

CHEST - 2 VIEW

[x chest ap]
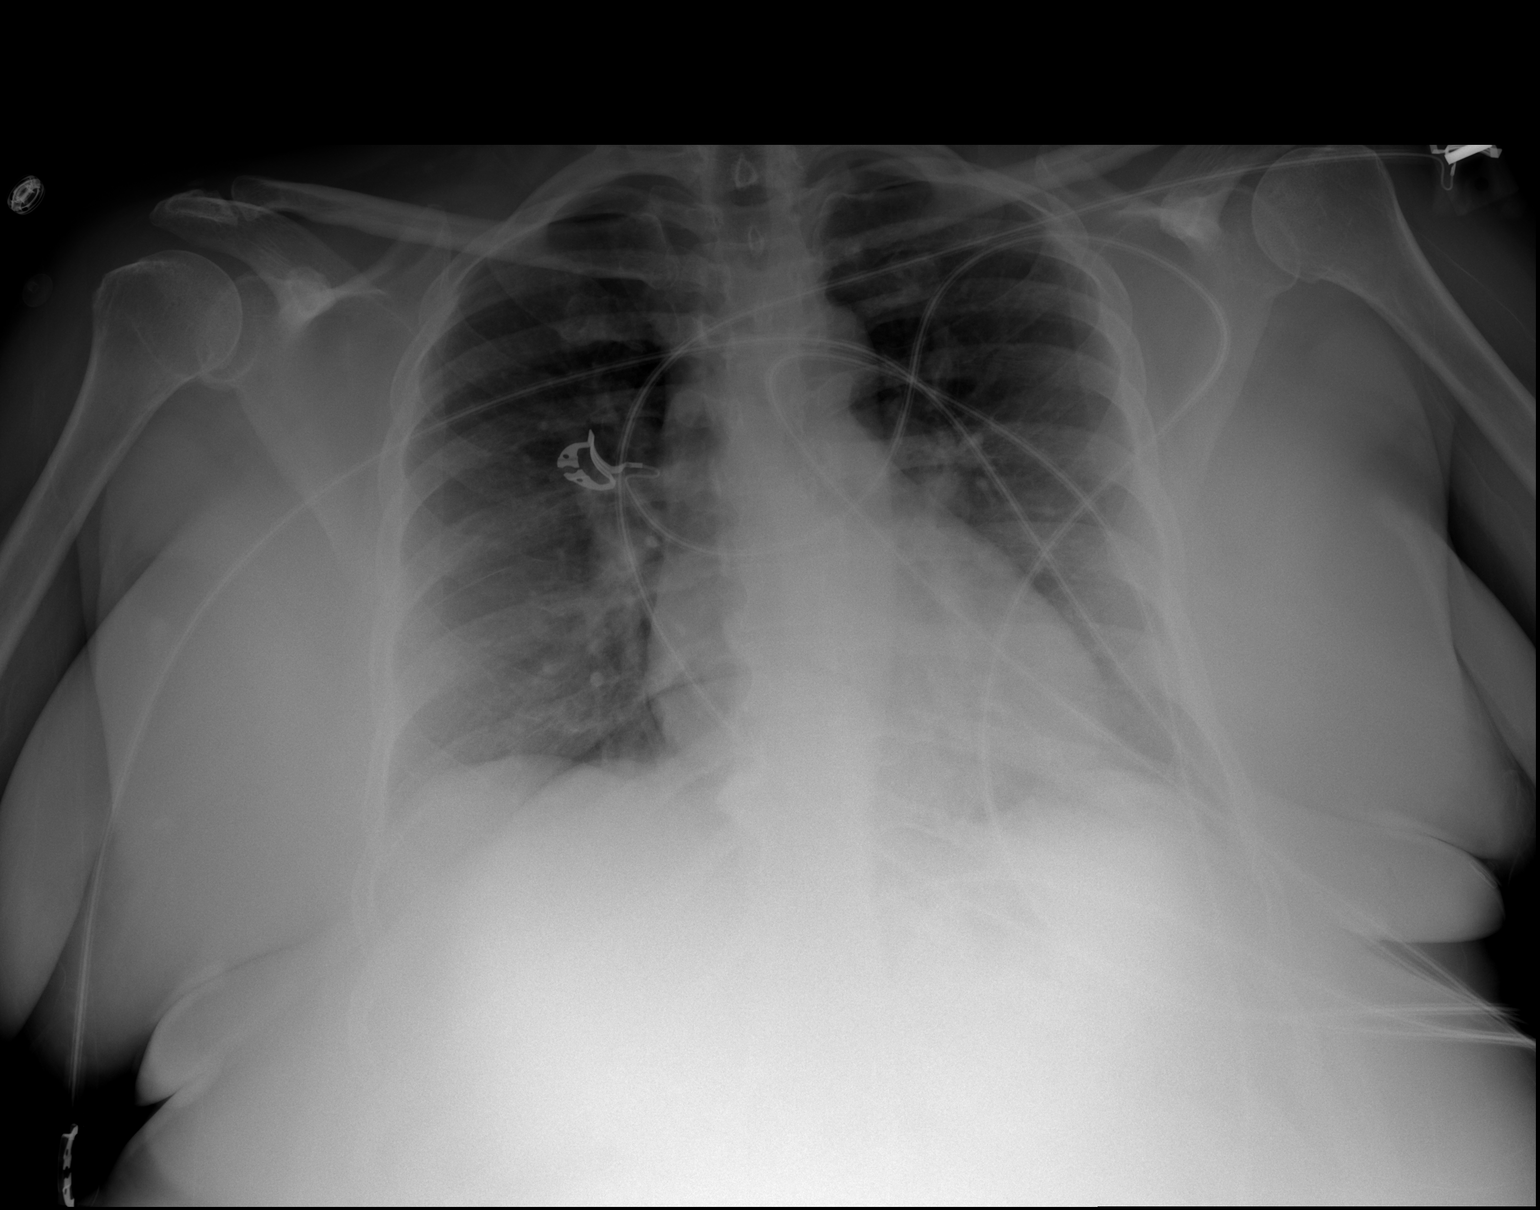

[w chest lat]
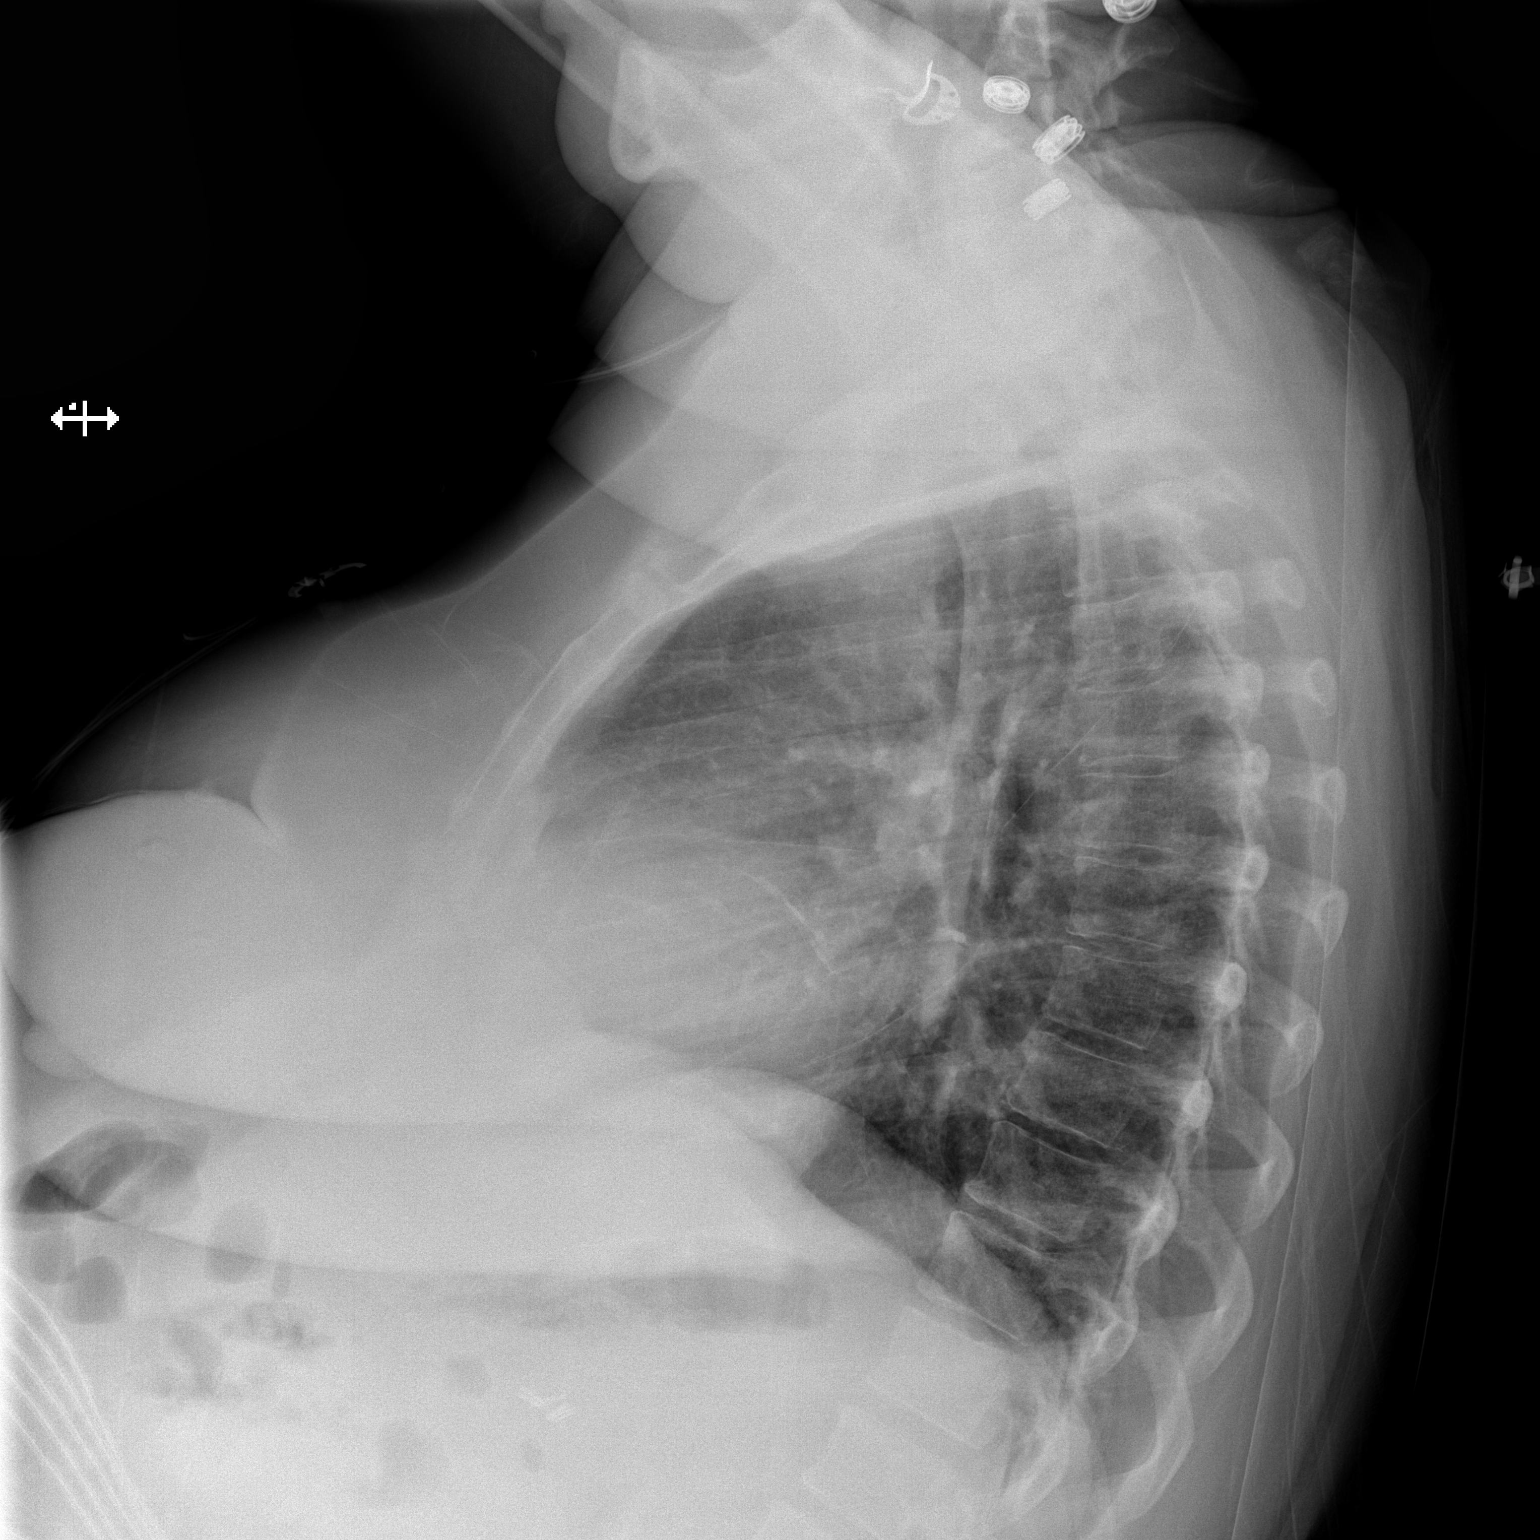

[2 of 2 positions shown; findings below may reference images not displayed]

FINDINGS: Heart is borderline in size.  Lungs are clear.  No
effusions or edema.  No acute bony abnormality.
IMPRESSION: No active cardiopulmonary disease.

## 2013-09-15 IMAGING — CT CT ABD-PELV W/O CM
1 of 2 series · 15 of 32 positions shown, 19 images · non-contrast
Comparison: Plain film 05/26/2011.  Multiple CTs, the most recent
04/03/2009.  MRI 07/03/2006.

CLINICAL DATA: Abdominal pain, nausea, diarrhea.

CT ABDOMEN AND PELVIS WITHOUT CONTRAST
TECHNIQUE: Multidetector CT imaging of the abdomen and pelvis was
performed following the standard protocol without intravenous
contrast.

[Series 2: abd/pel w/o · axial · non-contrast · 0.62mm/px · z∈[-440,-50]mm · 15 of 86 slices shown, 19 images]
[im 4/86  soft-tissue]
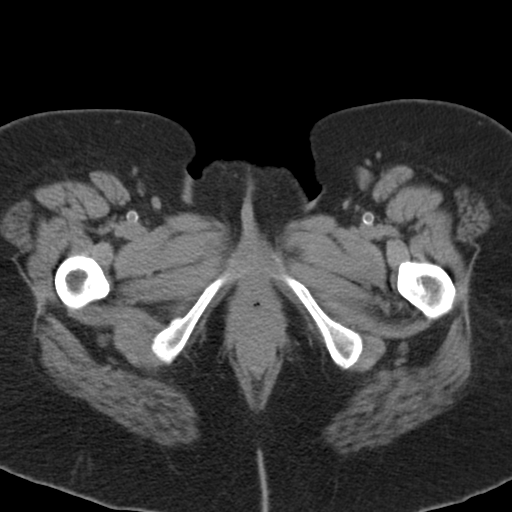
[im 4/86  bone]
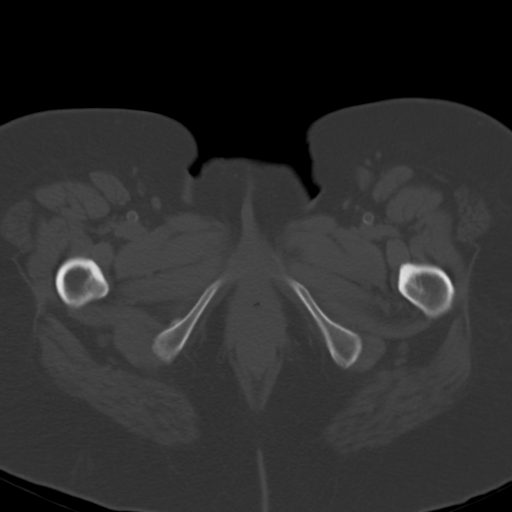
[im 11/86  soft-tissue]
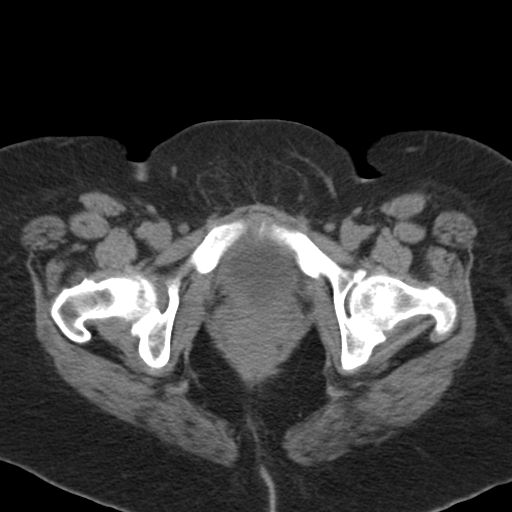
[im 18/86  soft-tissue]
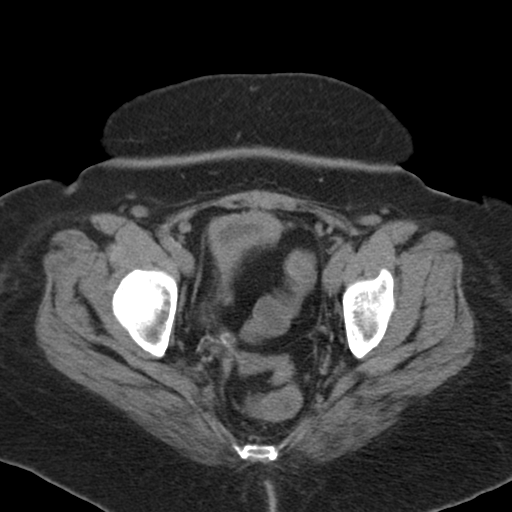
[im 25/86  soft-tissue]
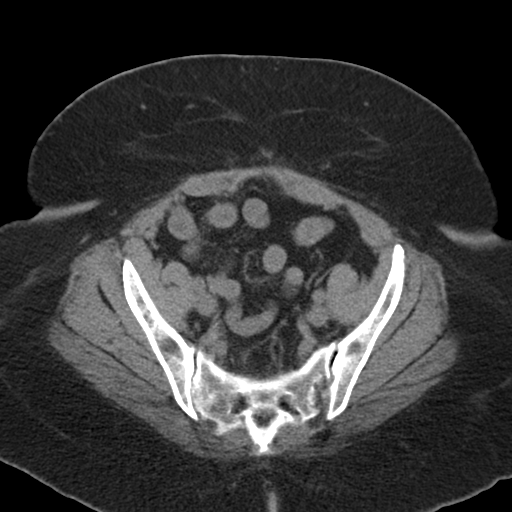
[im 29/86  soft-tissue]
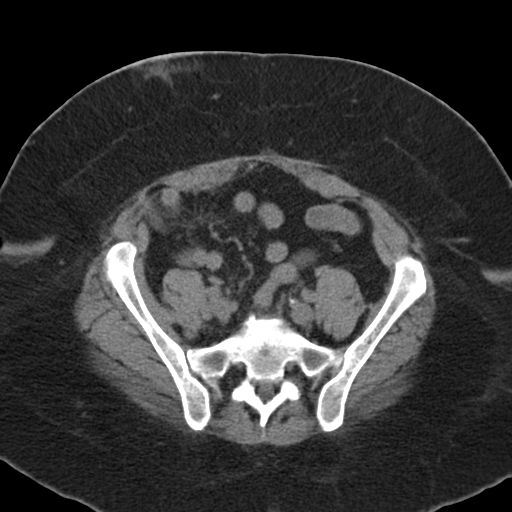
[im 36/86  soft-tissue]
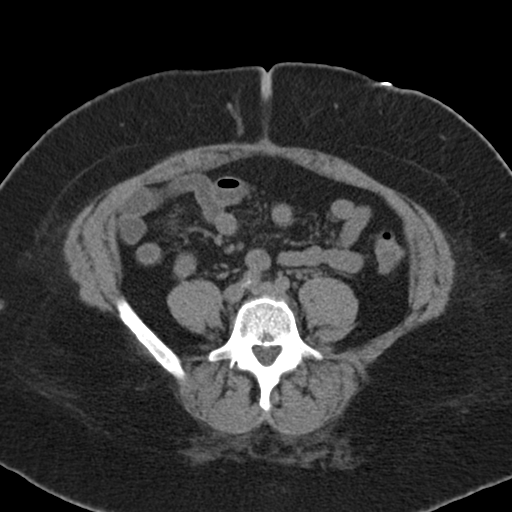
[im 43/86  soft-tissue]
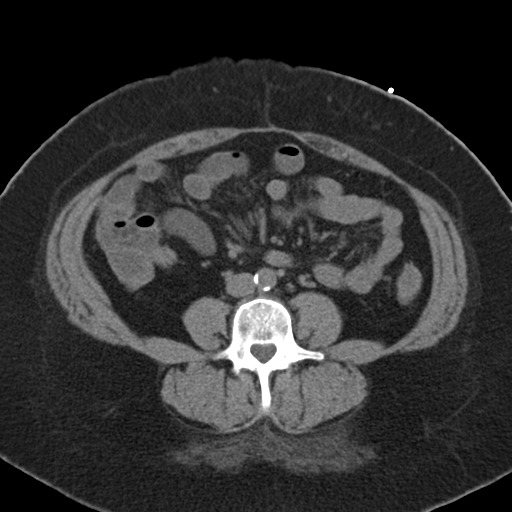
[im 50/86  soft-tissue]
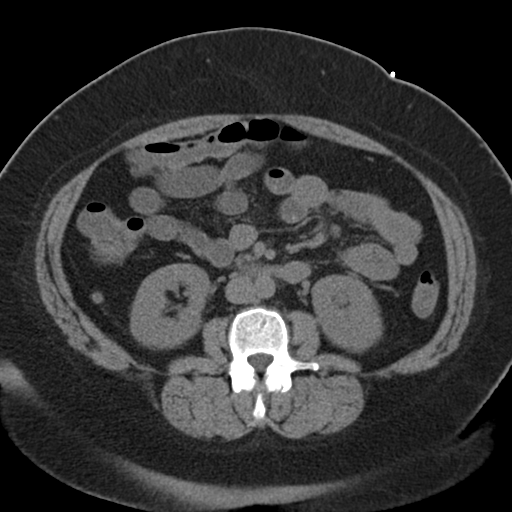
[im 57/86  soft-tissue]
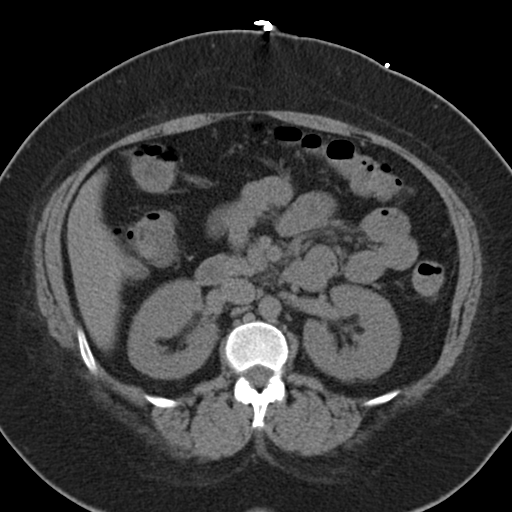
[im 57/86  bone]
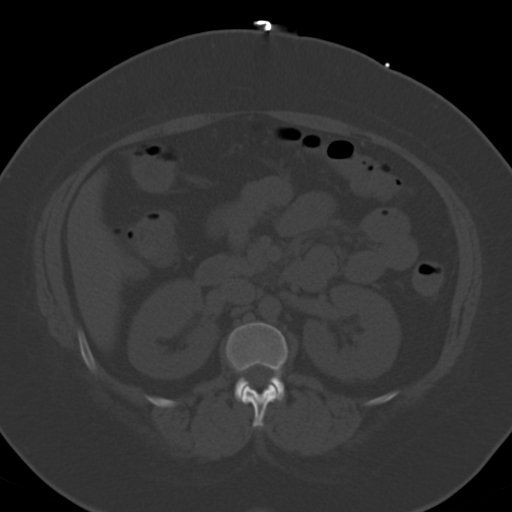
[im 61/86  soft-tissue]
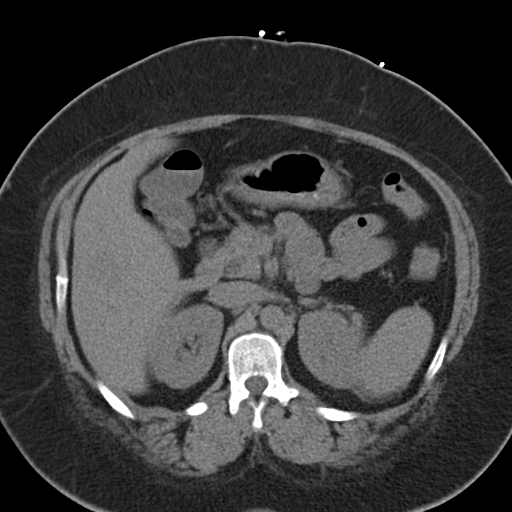
[im 68/86  soft-tissue]
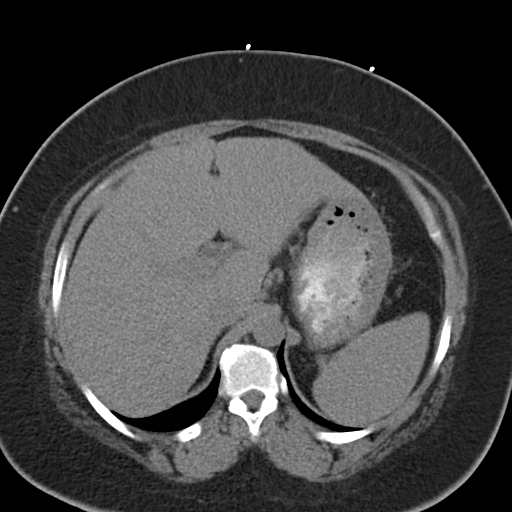
[im 71/86  lung]
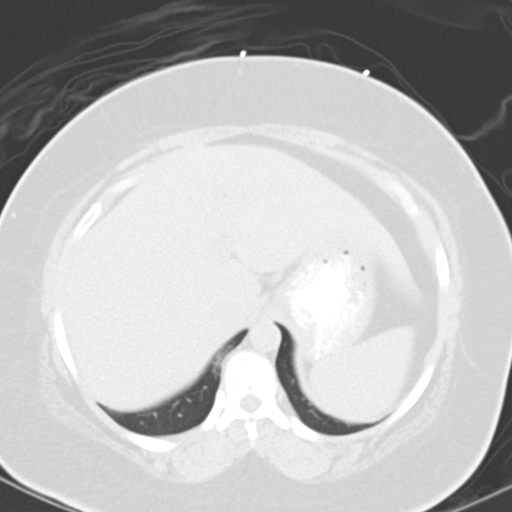
[im 75/86  soft-tissue]
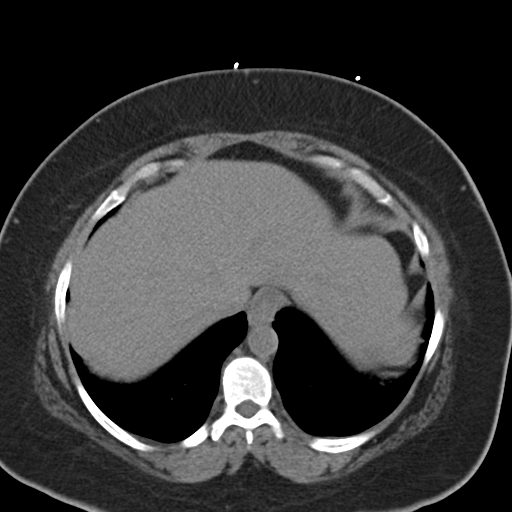
[im 75/86  lung]
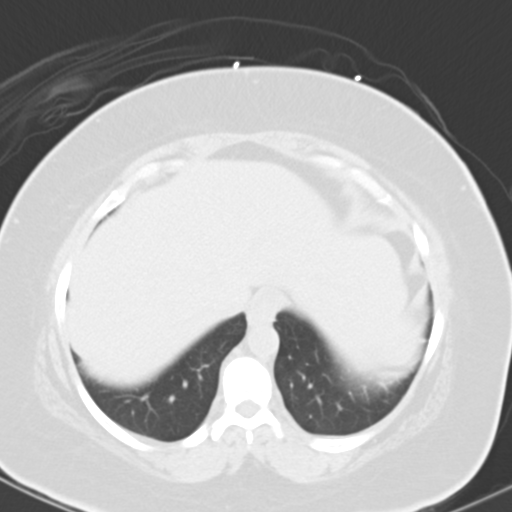
[im 78/86  lung]
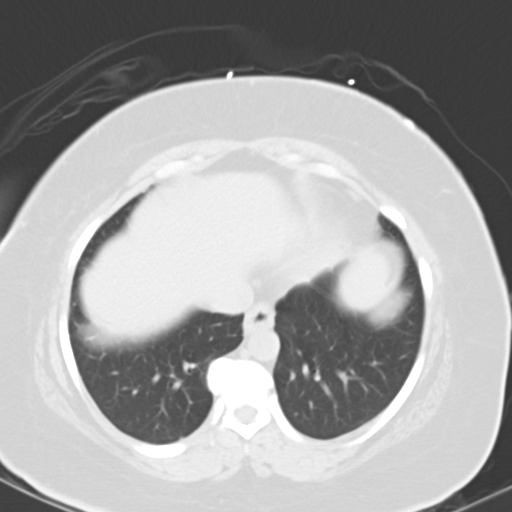
[im 82/86  soft-tissue]
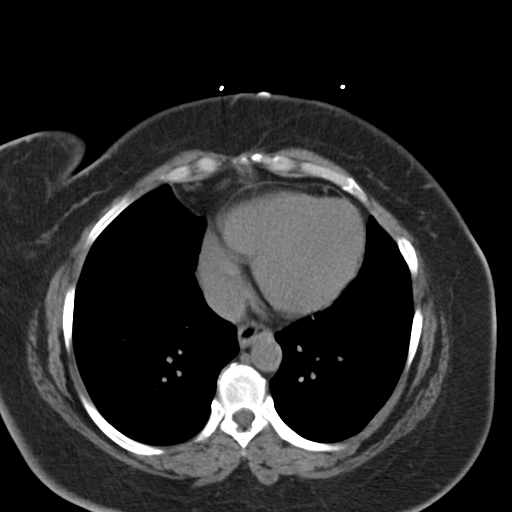
[im 82/86  lung]
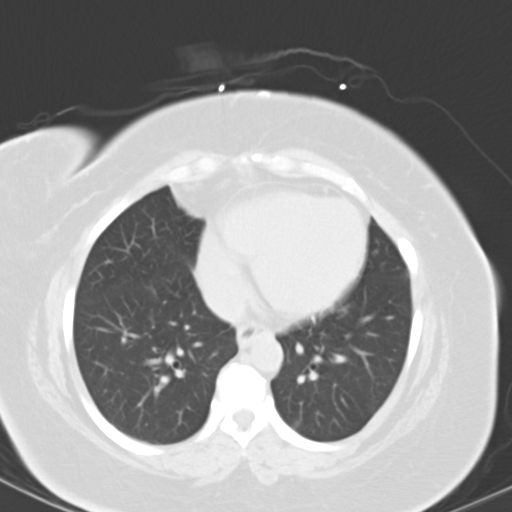

[15 of 32 positions shown; findings below may reference images not displayed]

FINDINGS: Lung bases are clear.  No effusions.  Heart is normal
size.

Prior cholecystectomy. Spleen, pancreas, adrenals and kidneys are
unremarkable.

While difficult to see on the study without IV contrast, there is a
low density lesion within the right hepatic lobe measuring 3.6 x
1.9 cm. When comparing to multiple old studies, including abdominal
MRI dating back to 200 no acute bony abnormality.  8, this area was
shown by prior MRI to most compatible with hemangioma.  This has
enlarged since prior study.

Normal retrocecal appendix.  Large and small bowel grossly
unremarkable.  No free fluid or free air.  Small retroperitoneal
lymph nodes are stable since prior study.  Aorta is normal caliber.
IMPRESSION: Enlarging hypodensity in the right hepatic lobe.  This was shown on
prior MRI to be the most compatible with hemangioma.

No acute findings in the abdomen or pelvis.

## 2013-09-30 ENCOUNTER — Other Ambulatory Visit: Payer: Self-pay | Admitting: Internal Medicine

## 2013-09-30 DIAGNOSIS — J45909 Unspecified asthma, uncomplicated: Secondary | ICD-10-CM

## 2013-10-01 ENCOUNTER — Other Ambulatory Visit: Payer: Self-pay | Admitting: Internal Medicine

## 2013-10-01 DIAGNOSIS — J45901 Unspecified asthma with (acute) exacerbation: Secondary | ICD-10-CM

## 2013-11-02 ENCOUNTER — Encounter (HOSPITAL_BASED_OUTPATIENT_CLINIC_OR_DEPARTMENT_OTHER): Payer: Self-pay | Admitting: Emergency Medicine

## 2013-11-02 ENCOUNTER — Emergency Department (HOSPITAL_BASED_OUTPATIENT_CLINIC_OR_DEPARTMENT_OTHER)
Admission: EM | Admit: 2013-11-02 | Discharge: 2013-11-02 | Disposition: A | Discharge status: Home / Self Care | Payer: PRIVATE HEALTH INSURANCE | Attending: Emergency Medicine | Admitting: Emergency Medicine

## 2013-11-02 ENCOUNTER — Emergency Department (HOSPITAL_BASED_OUTPATIENT_CLINIC_OR_DEPARTMENT_OTHER): Payer: PRIVATE HEALTH INSURANCE

## 2013-11-02 DIAGNOSIS — Z8739 Personal history of other diseases of the musculoskeletal system and connective tissue: Secondary | ICD-10-CM | POA: Insufficient documentation

## 2013-11-02 DIAGNOSIS — IMO0002 Reserved for concepts with insufficient information to code with codable children: Secondary | ICD-10-CM | POA: Insufficient documentation

## 2013-11-02 DIAGNOSIS — Z87891 Personal history of nicotine dependence: Secondary | ICD-10-CM | POA: Insufficient documentation

## 2013-11-02 DIAGNOSIS — I10 Essential (primary) hypertension: Secondary | ICD-10-CM | POA: Insufficient documentation

## 2013-11-02 DIAGNOSIS — Z880 Allergy status to penicillin: Secondary | ICD-10-CM | POA: Insufficient documentation

## 2013-11-02 DIAGNOSIS — J45909 Unspecified asthma, uncomplicated: Secondary | ICD-10-CM | POA: Insufficient documentation

## 2013-11-02 DIAGNOSIS — E785 Hyperlipidemia, unspecified: Secondary | ICD-10-CM | POA: Diagnosis not present

## 2013-11-02 DIAGNOSIS — G909 Disorder of the autonomic nervous system, unspecified: Secondary | ICD-10-CM | POA: Insufficient documentation

## 2013-11-02 DIAGNOSIS — R079 Chest pain, unspecified: Secondary | ICD-10-CM

## 2013-11-02 DIAGNOSIS — R072 Precordial pain: Secondary | ICD-10-CM | POA: Diagnosis present

## 2013-11-02 DIAGNOSIS — E039 Hypothyroidism, unspecified: Secondary | ICD-10-CM | POA: Insufficient documentation

## 2013-11-02 DIAGNOSIS — E1149 Type 2 diabetes mellitus with other diabetic neurological complication: Secondary | ICD-10-CM | POA: Insufficient documentation

## 2013-11-02 DIAGNOSIS — Z79899 Other long term (current) drug therapy: Secondary | ICD-10-CM | POA: Insufficient documentation

## 2013-11-02 DIAGNOSIS — E1143 Type 2 diabetes mellitus with diabetic autonomic (poly)neuropathy: Secondary | ICD-10-CM

## 2013-11-02 LAB — COMPREHENSIVE METABOLIC PANEL
ALT: 19 U/L (ref 0–35)
AST: 19 U/L (ref 0–37)
Albumin: 3.4 g/dL — ABNORMAL LOW (ref 3.5–5.2)
Alkaline Phosphatase: 115 U/L (ref 39–117)
BUN: 29 mg/dL — ABNORMAL HIGH (ref 6–23)
CHLORIDE: 104 meq/L (ref 96–112)
CO2: 22 meq/L (ref 19–32)
Calcium: 10.1 mg/dL (ref 8.4–10.5)
Creatinine, Ser: 1.4 mg/dL — ABNORMAL HIGH (ref 0.50–1.10)
GFR calc Af Amer: 48 mL/min — ABNORMAL LOW (ref >90)
GFR calc non Af Amer: 42 mL/min — ABNORMAL LOW (ref >90)
Glucose, Bld: 383 mg/dL — ABNORMAL HIGH (ref 70–99)
Potassium: 5 mEq/L (ref 3.7–5.3)
Sodium: 142 mEq/L (ref 137–147)
Total Bilirubin: <0.2 mg/dL — ABNORMAL LOW (ref 0.3–1.2)
Total Protein: 7.4 g/dL (ref 6.0–8.3)

## 2013-11-02 LAB — CBC
HCT: 35.5 % — ABNORMAL LOW (ref 36.0–46.0)
Hemoglobin: 12.1 g/dL (ref 12.0–15.0)
MCH: 26.4 pg (ref 26.0–34.0)
MCHC: 34.1 g/dL (ref 30.0–36.0)
MCV: 77.3 fL — ABNORMAL LOW (ref 78.0–100.0)
PLATELETS: 212 10*3/uL (ref 150–400)
RBC: 4.59 MIL/uL (ref 3.87–5.11)
RDW: 13.7 % (ref 11.5–15.5)
WBC: 10.9 10*3/uL — AB (ref 4.0–10.5)

## 2013-11-02 LAB — PROTIME-INR
INR: 0.98 (ref 0.00–1.49)
PROTHROMBIN TIME: 12.8 s (ref 11.6–15.2)

## 2013-11-02 LAB — TROPONIN I

## 2013-11-02 LAB — D-DIMER, QUANTITATIVE: D-Dimer, Quant: <0.27 ug/mL-FEU (ref 0.00–0.48)

## 2013-11-02 LAB — CBG MONITORING, ED: Glucose-Capillary: 303 mg/dL — ABNORMAL HIGH (ref 70–99)

## 2013-11-02 LAB — MAGNESIUM: Magnesium: 1.7 mg/dL (ref 1.5–2.5)

## 2013-11-02 LAB — APTT: APTT: 30 s (ref 24–37)

## 2013-11-02 MED ORDER — SODIUM CHLORIDE 0.9 % IV SOLN
20.0000 mL | INTRAVENOUS | Status: DC
  Administered 2013-11-02: 20 mL via INTRAVENOUS

## 2013-11-02 MED ORDER — NITROGLYCERIN 0.4 MG SL SUBL
0.4000 mg | SUBLINGUAL_TABLET | SUBLINGUAL | Status: AC | PRN
  Administered 2013-11-02 (×3): 0.4 mg via SUBLINGUAL
  Filled 2013-11-02 (×3): qty 1

## 2013-11-02 MED ORDER — ONDANSETRON HCL 4 MG/2ML IJ SOLN
4.0000 mg | Freq: Once | INTRAMUSCULAR | Status: AC
  Administered 2013-11-02: 4 mg via INTRAVENOUS
  Filled 2013-11-02: qty 2

## 2013-11-02 MED ORDER — ASPIRIN 81 MG PO CHEW
324.0000 mg | CHEWABLE_TABLET | Freq: Once | ORAL | Status: AC
  Administered 2013-11-02: 324 mg via ORAL
  Filled 2013-11-02: qty 4

## 2013-11-02 MED ORDER — HYDROMORPHONE HCL PF 1 MG/ML IJ SOLN
1.0000 mg | Freq: Once | INTRAMUSCULAR | Status: AC
  Administered 2013-11-02: 1 mg via INTRAVENOUS
  Filled 2013-11-02: qty 1

## 2013-11-02 MED ORDER — SODIUM CHLORIDE 0.9 % IV BOLUS (SEPSIS)
500.0000 mL | Freq: Once | INTRAVENOUS | Status: AC
  Administered 2013-11-02: 17:00:00 via INTRAVENOUS

## 2013-11-02 NOTE — ED Notes (Signed)
Pt placed on bedpan at this time.

## 2013-11-02 NOTE — ED Notes (Signed)
[id number] 

## 2013-11-02 NOTE — ED Notes (Signed)
MD at bedside. 

## 2013-11-02 NOTE — Discharge Instructions (Signed)

## 2013-11-02 NOTE — ED Notes (Signed)
Pt reports dizziness for a while.  Sts that she had an episode of vomiting today.

## 2013-11-02 NOTE — ED Notes (Signed)
Pt reports that she woke up this morning with chest pain, dizziness, shortness of breath, lightheadedness. Pt reports numerous other complaints: neck and back and arm pain, diabetes. Now also reports that the chest pain is intermittent but worse today.

## 2013-11-02 NOTE — ED Notes (Signed)
Pt resting easily at this time. Family at bedside. IV site unremarkable. NAD noted. Call bell within reach.

## 2013-11-02 NOTE — ED Provider Notes (Addendum)
CSN: [id number]     Arrival date & time 11/02/13  1423 History  This chart was scribed for Shaune Pollack, MD by Martinique Peace, ED Scribe. The patient was seen in [place]. The patient's care was started at 3:19 PM.    Chief Complaint  Patient presents with  . Chest Pain      The history is provided by the patient and a friend. No language interpreter was used.   HPI Comments: Amy [name] is a 53 y.o. Female who presents to the Emergency Department complaining of dizziness, lightheadedness and SOB onset this morning around 6 AM this morning and reports nausea and vomiting shortly after eating breakfast. Pt states that she took her BP this morning as well and reports it was 88/46 and her blood sugar was 88. Pt further reports experiencing severe intermittent mid sternal CP that started around 12:30 after going out to lunch. She rates the pain as 10/10 initially but states is has slightly improved to about 7/10 since arriving here at the ED but is currently complaining of associated leg pain around her patella. 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 She denies fever or similar CP in the past. Pt describes the pain as pressure but denies that it radiates anywhere. She denies taking any medication to help alleviate pain.  She denies history of drinking or smoking. She further denies history of blood clot in her legs. She states history of DM, asthma, hypertension, and high cholesterol.  Past Medical History  Diagnosis Date  . Asthma   . Diabetes mellitus   . Hypertension   . Blind   . Hypothyroidism   . Hyperlipidemia   . Diabetic neuropathy   . Blindness and low vision     left eye glass eye,  legally blind in right eye  . Spinal stenosis    Past Surgical History  Procedure Laterality Date  . Cesarean section      x 2  . Abdominal hysterectomy    . Cholecystectomy    . Enucleation     Family History  Problem Relation Age of Onset  . Diabetes  Sister    History  Substance Use Topics  . Smoking status: Former Smoker    Quit date: 05/22/1992  . Smokeless tobacco: Never Used  . Alcohol Use: No   OB History   Grav Para Term Preterm Abortions TAB SAB Ect Mult Living                 Obstetric Comments   Pt has two sons.     Review of Systems  Constitutional: Negative for fever.  Respiratory: Positive for shortness of breath.   Cardiovascular: Positive for chest pain (medial).  Gastrointestinal: Positive for nausea and vomiting.  Musculoskeletal:       Right leg pain.  Neurological: Positive for dizziness and light-headedness.      Allergies  Codeine; Iohexol; Penicillins; and Sulfa antibiotics  Home Medications   Prior to Admission medications   Medication Sig Start Date End Date Taking? Authorizing Provider  albuterol (PROVENTIL HFA;VENTOLIN HFA) 108 (90 BASE) MCG/ACT inhaler Inhale 2 puffs into the lungs every 4 (four) hours as needed. For shortness of breath    Historical Provider, MD  clobetasol cream (TEMOVATE) AB-123456789 % Apply 1 application topically 2 (two) times daily.    Historical Provider, MD  cyclobenzaprine (FLEXERIL) 10 MG tablet TAKE 1 TABLET BY MOUTH THREE TIMES A DAY AS  NEEDED 06/03/13   Angelica Chessman, MD  docusate sodium (COLACE) 100 MG capsule Take 100 mg by mouth daily.    Historical Provider, MD  Dulaglutide (TRULICITY [place]) Inject 1.7 mLs into the skin once a week. Takes on Wednesday    Historical Provider, MD  esomeprazole (NEXIUM) 40 MG capsule Take 40 mg by mouth daily at 12 noon.    Historical Provider, MD  Insulin Detemir (LEVEMIR FLEXPEN) 100 UNIT/ML SOPN Inject 40 Units into the skin at bedtime. 01/02/13   Reyne Dumas, MD  levothyroxine (SYNTHROID, LEVOTHROID) 137 MCG tablet Take 1 tablet (137 mcg total) by mouth every morning. 02/05/13   Angelica Chessman, MD  Linaclotide (LINZESS) 145 MCG CAPS capsule Take 145 mcg by mouth every morning.    Historical Provider, MD  Menthol, Topical  Analgesic, (ICY HOT EX) Apply 1 application topically at bedtime. Apply to back    Historical Provider, MD  midodrine (PROAMATINE) 5 MG tablet Take 5 mg by mouth every morning.    Historical Provider, MD  montelukast (SINGULAIR) 10 MG tablet TAKE 1 TABLET BY MOUTH EVERY NIGHT AT BEDTIME    Angelica Chessman, MD  potassium chloride SA (K-DUR,KLOR-CON) 20 MEQ tablet TAKE 1 TABLET BY MOUTH EVERY DAY 06/03/13   Shanker Kristeen Mans, MD  pregabalin (LYRICA) 75 MG capsule Take 75 mg by mouth 3 (three) times daily.     Historical Provider, MD  simvastatin (ZOCOR) 20 MG tablet Take 20 mg by mouth every morning.     Historical Provider, MD  triamcinolone cream (KENALOG) 0.1 % APPLY TO AFFECTED AREA TWICE DAILY 08/01/13   Theodis Blaze, MD   Triage Vitals: BP 123/68  Pulse 95  Temp(Src) 99 F (37.2 C) (Oral)  Resp 24  Ht 4\' 11"  (1.499 m)  Wt 162 lb (73.483 kg)  BMI 32.70 kg/m2  SpO2 98% Physical Exam  Nursing note and vitals reviewed. Constitutional: She appears well-developed and well-nourished.  HENT:  Head: Normocephalic and atraumatic.  Mouth/Throat: Oropharynx is clear and moist.  Eyes:  Pt is blind.  Neck: No tracheal deviation present.  Cardiovascular: Normal rate, regular rhythm and normal heart sounds.  Exam reveals no gallop and no friction rub.   No murmur heard. Abdominal: Soft. Bowel sounds are normal. She exhibits no distension. There is no rebound and no guarding.  Musculoskeletal: Normal range of motion. She exhibits no edema.  Neurological: She is alert.  Skin: Skin is warm and dry.   ED Course  Procedures (including critical care time) DIAGNOSTIC STUDIES: Oxygen Saturation is 98% on room air, normal by my interpretation.    COORDINATION OF CARE: 3:32 PM- Treatment plan was discussed with patient who verbalizes understanding and agrees.   4:53 PM- Reassessment with pt. She states that her pain has slightly improved after taking pain medication to 6/10 but states after  taking second pill, there hasn't been much improvement. New order for narcotic to further help with chest pain.   6:48 PM- Pt reports that pain is completely gone. Discuss findings with pt which included heart traces look norma and no blood clots found. Discusses follow up with PCP and monitoring of problems.   Labs Review Labs Reviewed  CBC - Abnormal; Notable for the following:    WBC 10.9 (*)    HCT 35.5 (*)    MCV 77.3 (*)    All other components within normal limits  COMPREHENSIVE METABOLIC PANEL - Abnormal; Notable for the following:    Glucose, Bld 383 (*)  BUN 29 (*)    Creatinine, Ser 1.40 (*)    Albumin 3.4 (*)    Total Bilirubin <0.2 (*)    GFR calc non Af Amer 42 (*)    GFR calc Af Amer 48 (*)    All other components within normal limits  CBG MONITORING, ED - Abnormal; Notable for the following:    Glucose-Capillary 303 (*)    All other components within normal limits  APTT  PROTIME-INR  TROPONIN I  MAGNESIUM  D-DIMER, QUANTITATIVE  URINALYSIS, ROUTINE W REFLEX MICROSCOPIC  TROPONIN I    Imaging Review Dg Chest Portable 1 View  11/02/2013   CLINICAL DATA:  Chest pain, dizziness, shortness of breath, with neck, back and arm pain, history asthma, diabetes, hypertension  EXAM: PORTABLE CHEST - 1 VIEW  COMPARISON:  Portable exam 1550 hr compared to 05/18/2013  FINDINGS: Normal heart size, mediastinal contours and pulmonary vascularity.  Lungs clear.  No pleural effusion or pneumothorax.  Bones unremarkable.  IMPRESSION: No acute abnormalities.   Electronically Signed   By: Lavonia Dana M.D.   On: 11/02/2013 16:05     EKG Interpretation   Date/Time:  Saturday November 02 2013 14:30:36 EDT Ventricular Rate:  97 PR Interval:  120 QRS Duration: 72 QT Interval:  360 QTC Calculation: 457 R Axis:   20 Text Interpretation:  Normal sinus rhythm Normal ECG Confirmed by RAY MD,  DANIELLE ([id number]) on 11/02/2013 4:15:25 PM     Medications  0.9 %  sodium chloride infusion  (20 mLs Intravenous New Bag/Given 11/02/13 1558)  aspirin chewable tablet 324 mg (324 mg Oral Given 11/02/13 1543)  nitroGLYCERIN (NITROSTAT) SL tablet 0.4 mg (0.4 mg Sublingual Given by Other 11/02/13 1650)  sodium chloride 0.9 % bolus 500 mL (0 mLs Intravenous Stopped 11/02/13 1738)  HYDROmorphone (DILAUDID) injection 1 mg (1 mg Intravenous Given 11/02/13 1706)    MDM   Final diagnoses:  Chest pain, unspecified chest pain type  Autonomic dysfunction with type 2 diabetes mellitus   I personally performed the services described in this documentation, which was scribed in my presence. The recorded information has been reviewed and considered.  53 y.o. Female with chest pain with heart score 3, patient with no relief with nitro but complete relief with dilaudid.  EKG normal and delta troponin normal.  Patient advised regarding need for return with return symptoms and close follow up.  Patient had reported episode of hypotension this am with known hypotensive episodes secondary to autonomic dysfunction. Patient normotensive here.     Shaune Pollack, MD 11/02/13 [id number]  Shaune Pollack, MD 11/02/13 [id number]

## 2013-11-05 ENCOUNTER — Other Ambulatory Visit: Payer: Self-pay | Admitting: Radiology

## 2013-11-08 ENCOUNTER — Ambulatory Visit (INDEPENDENT_AMBULATORY_CARE_PROVIDER_SITE_OTHER): Payer: PRIVATE HEALTH INSURANCE | Admitting: Podiatrist

## 2013-11-08 DIAGNOSIS — E1140 Type 2 diabetes mellitus with diabetic neuropathy, unspecified: Secondary | ICD-10-CM

## 2013-11-08 DIAGNOSIS — E1149 Type 2 diabetes mellitus with other diabetic neurological complication: Secondary | ICD-10-CM

## 2013-11-08 DIAGNOSIS — E1142 Type 2 diabetes mellitus with diabetic polyneuropathy: Secondary | ICD-10-CM

## 2013-11-08 NOTE — Progress Notes (Signed)
Diabetic shoes dispensed.  Patient relates they are comfortable and free of defect.  She was given instructions for break in and wear. She was given instructions on changing out the diabetic inserts every 4 months.  I will see her back for her routine diabetic foot care appointment.

## 2013-11-28 ENCOUNTER — Other Ambulatory Visit: Payer: Self-pay | Admitting: Internal Medicine

## 2013-12-19 ENCOUNTER — Other Ambulatory Visit (HOSPITAL_COMMUNITY): Payer: Self-pay | Admitting: Cardiology

## 2013-12-19 ENCOUNTER — Other Ambulatory Visit (HOSPITAL_COMMUNITY): Payer: Self-pay | Admitting: Internal Medicine

## 2013-12-19 ENCOUNTER — Ambulatory Visit (HOSPITAL_COMMUNITY)
Admission: RE | Admit: 2013-12-19 | Discharge: 2013-12-19 | Disposition: A | Discharge status: Home / Self Care | Payer: PRIVATE HEALTH INSURANCE | Source: Ambulatory Visit | Attending: Vascular Surgery | Admitting: Vascular Surgery

## 2013-12-19 DIAGNOSIS — R609 Edema, unspecified: Secondary | ICD-10-CM | POA: Diagnosis not present

## 2013-12-30 ENCOUNTER — Other Ambulatory Visit: Payer: Self-pay | Admitting: Internal Medicine

## 2014-01-19 ENCOUNTER — Encounter (HOSPITAL_COMMUNITY): Payer: Self-pay | Admitting: Emergency Medicine

## 2014-01-19 ENCOUNTER — Emergency Department (HOSPITAL_COMMUNITY)
Admission: EM | Admit: 2014-01-19 | Discharge: 2014-01-19 | Disposition: A | Discharge status: Home / Self Care | Payer: PRIVATE HEALTH INSURANCE | Attending: Emergency Medicine | Admitting: Emergency Medicine

## 2014-01-19 DIAGNOSIS — E039 Hypothyroidism, unspecified: Secondary | ICD-10-CM | POA: Diagnosis not present

## 2014-01-19 DIAGNOSIS — I10 Essential (primary) hypertension: Secondary | ICD-10-CM | POA: Diagnosis not present

## 2014-01-19 DIAGNOSIS — Z794 Long term (current) use of insulin: Secondary | ICD-10-CM | POA: Diagnosis not present

## 2014-01-19 DIAGNOSIS — IMO0002 Reserved for concepts with insufficient information to code with codable children: Secondary | ICD-10-CM | POA: Insufficient documentation

## 2014-01-19 DIAGNOSIS — Z90710 Acquired absence of both cervix and uterus: Secondary | ICD-10-CM | POA: Diagnosis not present

## 2014-01-19 DIAGNOSIS — R112 Nausea with vomiting, unspecified: Secondary | ICD-10-CM | POA: Insufficient documentation

## 2014-01-19 DIAGNOSIS — Z9089 Acquired absence of other organs: Secondary | ICD-10-CM | POA: Diagnosis not present

## 2014-01-19 DIAGNOSIS — E785 Hyperlipidemia, unspecified: Secondary | ICD-10-CM | POA: Diagnosis not present

## 2014-01-19 DIAGNOSIS — J45909 Unspecified asthma, uncomplicated: Secondary | ICD-10-CM | POA: Insufficient documentation

## 2014-01-19 DIAGNOSIS — Z791 Long term (current) use of non-steroidal anti-inflammatories (NSAID): Secondary | ICD-10-CM | POA: Diagnosis not present

## 2014-01-19 DIAGNOSIS — Z880 Allergy status to penicillin: Secondary | ICD-10-CM | POA: Diagnosis not present

## 2014-01-19 DIAGNOSIS — Z79899 Other long term (current) drug therapy: Secondary | ICD-10-CM | POA: Diagnosis not present

## 2014-01-19 DIAGNOSIS — H547 Unspecified visual loss: Secondary | ICD-10-CM | POA: Insufficient documentation

## 2014-01-19 DIAGNOSIS — E1142 Type 2 diabetes mellitus with diabetic polyneuropathy: Secondary | ICD-10-CM | POA: Diagnosis not present

## 2014-01-19 DIAGNOSIS — E1149 Type 2 diabetes mellitus with other diabetic neurological complication: Secondary | ICD-10-CM | POA: Insufficient documentation

## 2014-01-19 DIAGNOSIS — R110 Nausea: Secondary | ICD-10-CM

## 2014-01-19 LAB — CBG MONITORING, ED: GLUCOSE-CAPILLARY: 300 mg/dL — AB (ref 70–99)

## 2014-01-19 MED ORDER — METOCLOPRAMIDE HCL 5 MG/ML IJ SOLN
5.0000 mg | Freq: Once | INTRAMUSCULAR | Status: AC
  Administered 2014-01-19: 5 mg via INTRAMUSCULAR
  Filled 2014-01-19: qty 2

## 2014-01-19 MED ORDER — ONDANSETRON 4 MG PO TBDP
4.0000 mg | ORAL_TABLET | Freq: Once | ORAL | Status: AC
  Administered 2014-01-19: 4 mg via ORAL
  Filled 2014-01-19: qty 1

## 2014-01-19 MED ORDER — METOCLOPRAMIDE HCL 10 MG PO TABS: 10.0000 mg | ORAL_TABLET | Freq: Four times a day (QID) | ORAL | Status: DC

## 2014-01-19 NOTE — Discharge Instructions (Signed)
Nausea, Adult Nausea is the feeling that you have an upset stomach or have to vomit. Nausea by itself is not likely a serious concern, but it may be an early sign of more serious medical problems. As nausea gets worse, it can lead to vomiting. If vomiting develops, there is the risk of dehydration.  CAUSES   Viral infections.  Food poisoning.  Medicines.  Pregnancy.  Motion sickness.  Migraine headaches.  Emotional distress.  Severe pain from any source.  Alcohol intoxication. HOME CARE INSTRUCTIONS  Get plenty of rest.  Ask your caregiver about specific rehydration instructions.  Eat small amounts of food and sip liquids more often.  Take all medicines as told by your caregiver. SEEK MEDICAL CARE IF:  You have not improved after 2 days, or you get worse.  You have a headache. SEEK IMMEDIATE MEDICAL CARE IF:   You have a fever.  You faint.  You keep vomiting or have blood in your vomit.  You are extremely weak or dehydrated.  You have dark or bloody stools.  You have severe chest or abdominal pain. MAKE SURE YOU:  Understand these instructions.  Will watch your condition.  Will get help right away if you are not doing well or get worse. Document Released: 06/09/2004 Document Revised: 01/25/2012 Document Reviewed: 01/12/2011 [facility] Patient Information 2015 [place], [place]. This information is not intended to replace advice given to you by your health care provider. Make sure you discuss any questions you have with your health care provider. Please use the Reglan as need for nausea  Please continue to use the pain patch as well Follow up with your PCP in the next several days

## 2014-01-19 NOTE — ED Provider Notes (Signed)
Medical screening examination/treatment/procedure(s) were performed by non-physician practitioner and as supervising physician I was immediately available for consultation/collaboration.     Veryl Speak, MD 01/19/14 [phone]

## 2014-01-19 NOTE — ED Notes (Signed)
Bed: [id number] Expected date:  Expected time:  Means of arrival:  Comments: Bed 19, EMS, 65 F, n/v

## 2014-01-19 NOTE — ED Notes (Signed)
Per EMS: Pt has new prescription pain patch for chronic pain in back, started using yesterday. Didn't feel well yesterday, N/V onset tonight. No diarrhea. Abdominal "soreness from throwing up." A&O x4. Pt is blind, lives at home by herself, has friend who checks on her frequently.

## 2014-01-19 NOTE — ED Notes (Signed)
[employer] [phone], pt friend

## 2014-01-19 NOTE — ED Provider Notes (Signed)
CSN: [id number]     Arrival date & time 01/19/14  0008 History   First MD Initiated Contact with Patient 01/19/14 0029     Chief Complaint  Patient presents with  . Emesis     (Consider location/radiation/quality/duration/timing/severity/associated sxs/prior Treatment) HPI Comments: Supplying a new pain.  Patient has had nausea.  She was walking by her physician, that she may have nausea as a side effect.  He did not give her any medication to control these symptoms.  She presents tonight with nausea  Patient is a 53 y.o. female presenting with vomiting. The history is provided by the patient.  Emesis Severity:  Mild Duration:  1 day Timing:  Constant Able to tolerate:  Liquids Progression:  Unchanged Chronicity:  New Recent urination:  Normal Relieved by:  Nothing Associated symptoms: no abdominal pain     Past Medical History  Diagnosis Date  . Asthma   . Diabetes mellitus   . Hypertension   . Blind   . Hypothyroidism   . Hyperlipidemia   . Diabetic neuropathy   . Blindness and low vision     left eye glass eye,  legally blind in right eye  . Spinal stenosis    Past Surgical History  Procedure Laterality Date  . Cesarean section      x 2  . Abdominal hysterectomy    . Cholecystectomy    . Enucleation     Family History  Problem Relation Age of Onset  . Diabetes Sister    History  Substance Use Topics  . Smoking status: Former Smoker    Quit date: 05/22/1992  . Smokeless tobacco: Never Used  . Alcohol Use: No   OB History   Grav Para Term Preterm Abortions TAB SAB Ect Mult Living                 Obstetric Comments   Pt has two sons.     Review of Systems  Constitutional: Negative for fever.  Gastrointestinal: Positive for nausea. Negative for vomiting and abdominal pain.  Neurological: Negative for dizziness.  All other systems reviewed and are negative.     Allergies  Codeine; Iohexol; Penicillins; and Sulfa antibiotics  Home Medications    Prior to Admission medications   Medication Sig Start Date End Date Taking? Authorizing Provider  albuterol (PROVENTIL HFA;VENTOLIN HFA) 108 (90 BASE) MCG/ACT inhaler Inhale 2 puffs into the lungs every 4 (four) hours as needed. For shortness of breath   Yes Historical Provider, MD  clobetasol cream (TEMOVATE) [id number] % Apply 1 application topically 2 (two) times daily.   Yes Historical Provider, MD  cyclobenzaprine (FLEXERIL) 10 MG tablet Take 10 mg by mouth 3 (three) times daily as needed for muscle spasms.   Yes Historical Provider, MD  Diclofenac Sodium 1.5 % SOLN Apply 1 application topically as needed. 12/26/13  Yes Historical Provider, MD  Dulaglutide (TRULICITY [place]) Inject 1.7 mLs into the skin once a week. Takes on Wednesday   Yes Historical Provider, MD  esomeprazole (NEXIUM) 40 MG capsule Take 40 mg by mouth daily at 12 noon.   Yes Historical Provider, MD  HUMALOG KWIKPEN 100 UNIT/ML KiwkPen Inject 5 Units into the skin daily before supper. 12/26/13  Yes Historical Provider, MD  Insulin Detemir (LEVEMIR FLEXPEN) 100 UNIT/ML SOPN Inject 40 Units into the skin at bedtime. 01/02/13  Yes Reyne Dumas, MD  levothyroxine (SYNTHROID, LEVOTHROID) 137 MCG tablet Take 1 tablet (137 mcg total) by mouth every morning. 02/05/13  Yes  Tresa Garter, MD  lidocaine (XYLOCAINE) 5 % ointment Apply 1 application topically as needed for mild pain.  12/26/13  Yes Historical Provider, MD  Linaclotide Rolan Lipa) 145 MCG CAPS capsule Take 145 mcg by mouth every morning.   Yes Historical Provider, MD  midodrine (PROAMATINE) 5 MG tablet Take 5 mg by mouth every morning.   Yes Historical Provider, MD  montelukast (SINGULAIR) 10 MG tablet Take 10 mg by mouth at bedtime.   Yes Historical Provider, MD  polyethylene glycol powder (GLYCOLAX/MIRALAX) powder Take 17 g by mouth daily. 12/16/13  Yes Historical Provider, MD  potassium chloride SA (K-DUR,KLOR-CON) 20 MEQ tablet TAKE 1 TABLET BY MOUTH EVERY DAY 06/03/13  Yes  Shanker Kristeen Mans, MD  pregabalin (LYRICA) 100 MG capsule Take 100 mg by mouth 3 (three) times daily.   Yes Historical Provider, MD  simvastatin (ZOCOR) 20 MG tablet Take 20 mg by mouth every morning.    Yes Historical Provider, MD  triamcinolone cream (KENALOG) 0.1 % APPLY TO AFFECTED AREA TWICE DAILY 08/01/13  Yes Theodis Blaze, MD  BUTRANS 10 MCG/HR PTWK patch Place 1 patch onto the skin every 7 (seven) days. 01/15/14   Historical Provider, MD  metoCLOPramide (REGLAN) 10 MG tablet Take 1 tablet (10 mg total) by mouth every 6 (six) hours. 01/19/14   Garald Balding, NP   BP 122/61  Pulse 80  Temp(Src) 98.5 F (36.9 C) (Oral)  Resp 16  SpO2 94% Physical Exam  Nursing note and vitals reviewed. Constitutional: She appears well-developed and well-nourished.  Eyes: Pupils are equal, round, and reactive to light.  Neck: Normal range of motion.  Cardiovascular: Normal rate and regular rhythm.   Pulmonary/Chest: Effort normal.  Abdominal: Soft.  Musculoskeletal: Normal range of motion.  Neurological: She is alert.  Skin: Skin is warm.    ED Course  Procedures (including critical care time) Labs Review Labs Reviewed  CBG MONITORING, ED - Abnormal; Notable for the following:    Glucose-Capillary 300 (*)    All other components within normal limits    Imaging Review No results found.   EKG Interpretation None      MDM   Final diagnoses:  Nausea     Will try ODT Zofran to help control symptoms  Feels better after Reglan  Will DC home with Rx for PO Reglan allow to keep using "pain patch" And FU with PCP   Garald Balding, NP 01/19/14 0354

## 2014-02-01 IMAGING — CR DG CHEST 2V
2 series · 2 of 2 positions shown · non-contrast
Comparison: 08/13/2011; 03/11/2011

CLINICAL DATA: Hyperglycemia, chest pain, coughing, congestion

CHEST - 2 VIEW

[w chest lat]
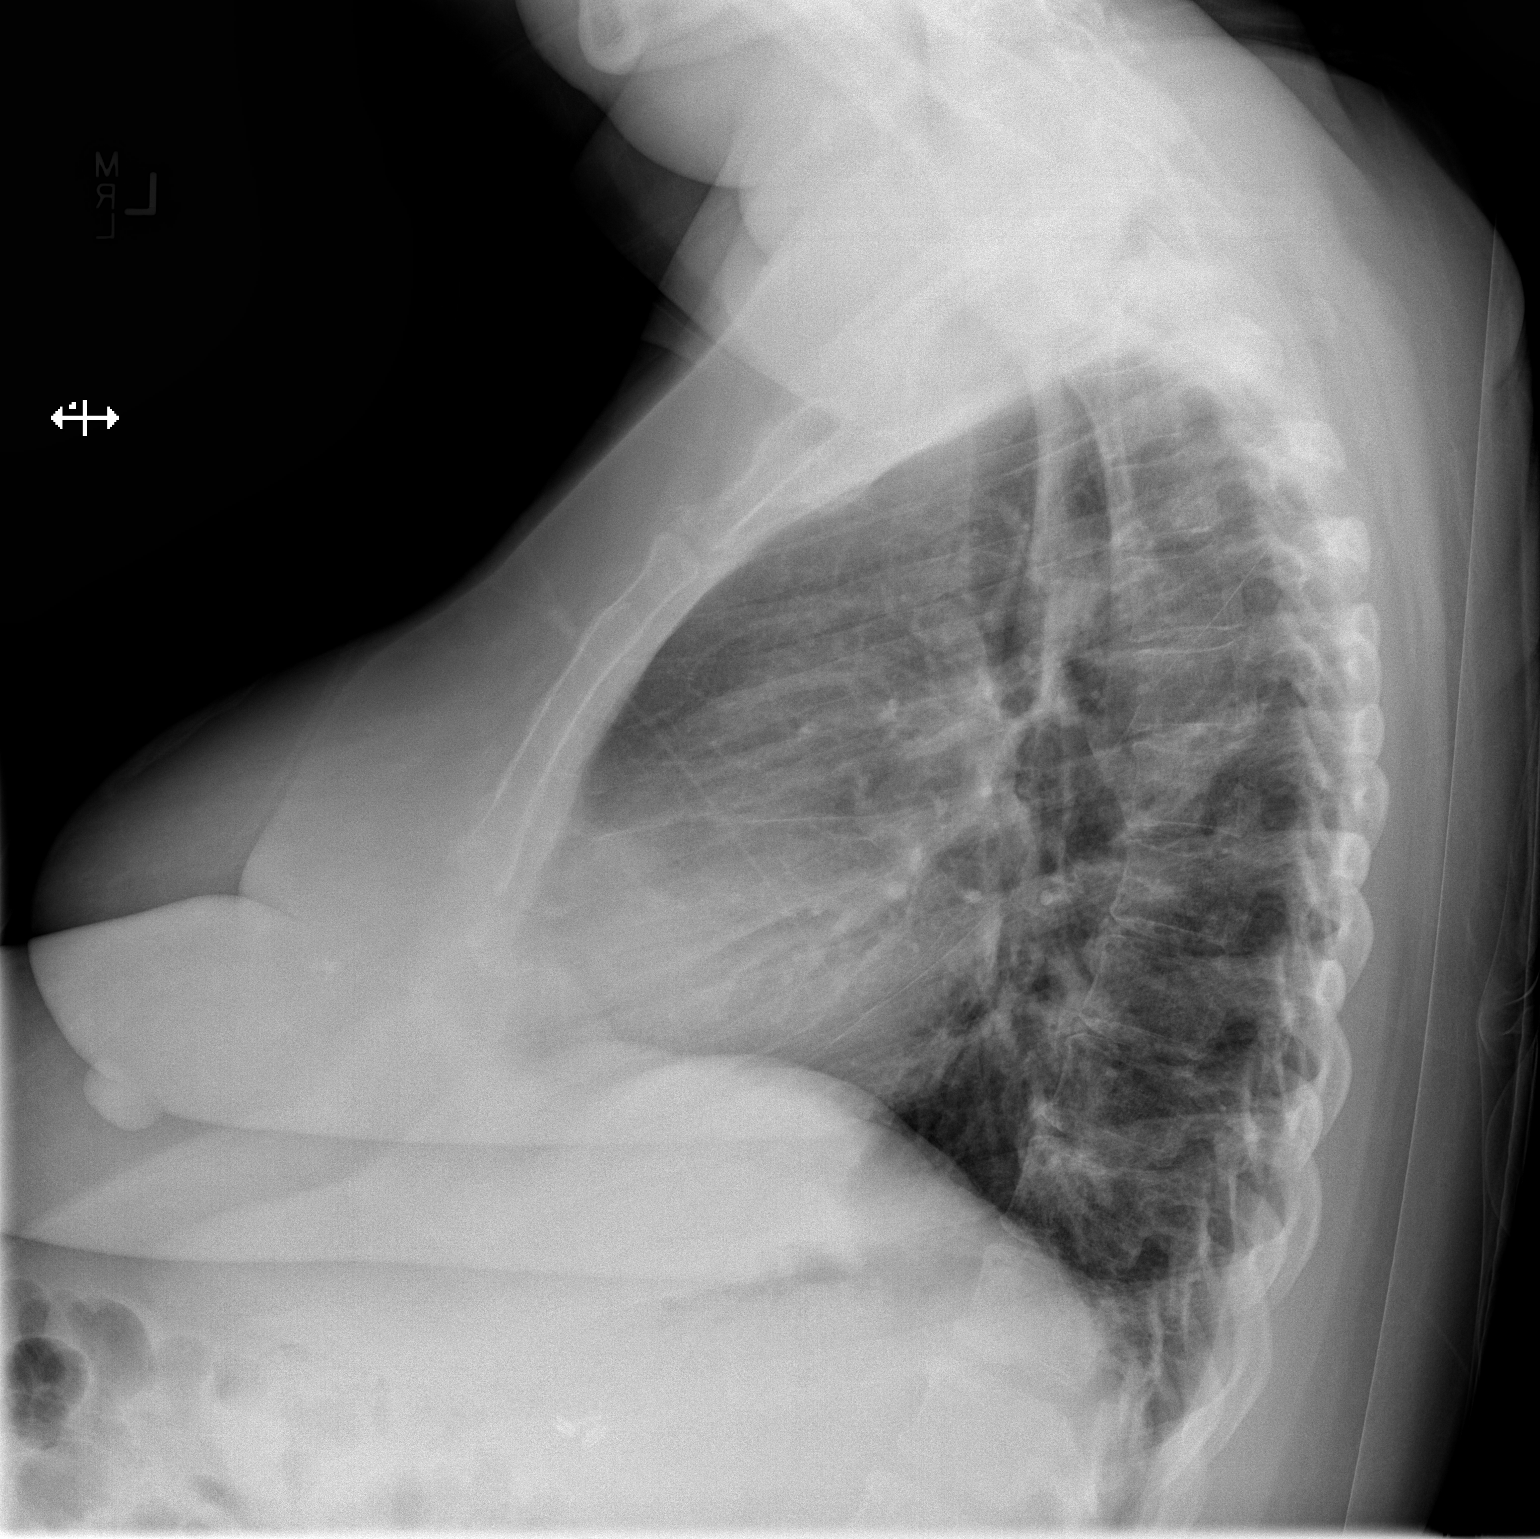

[x chest ap]
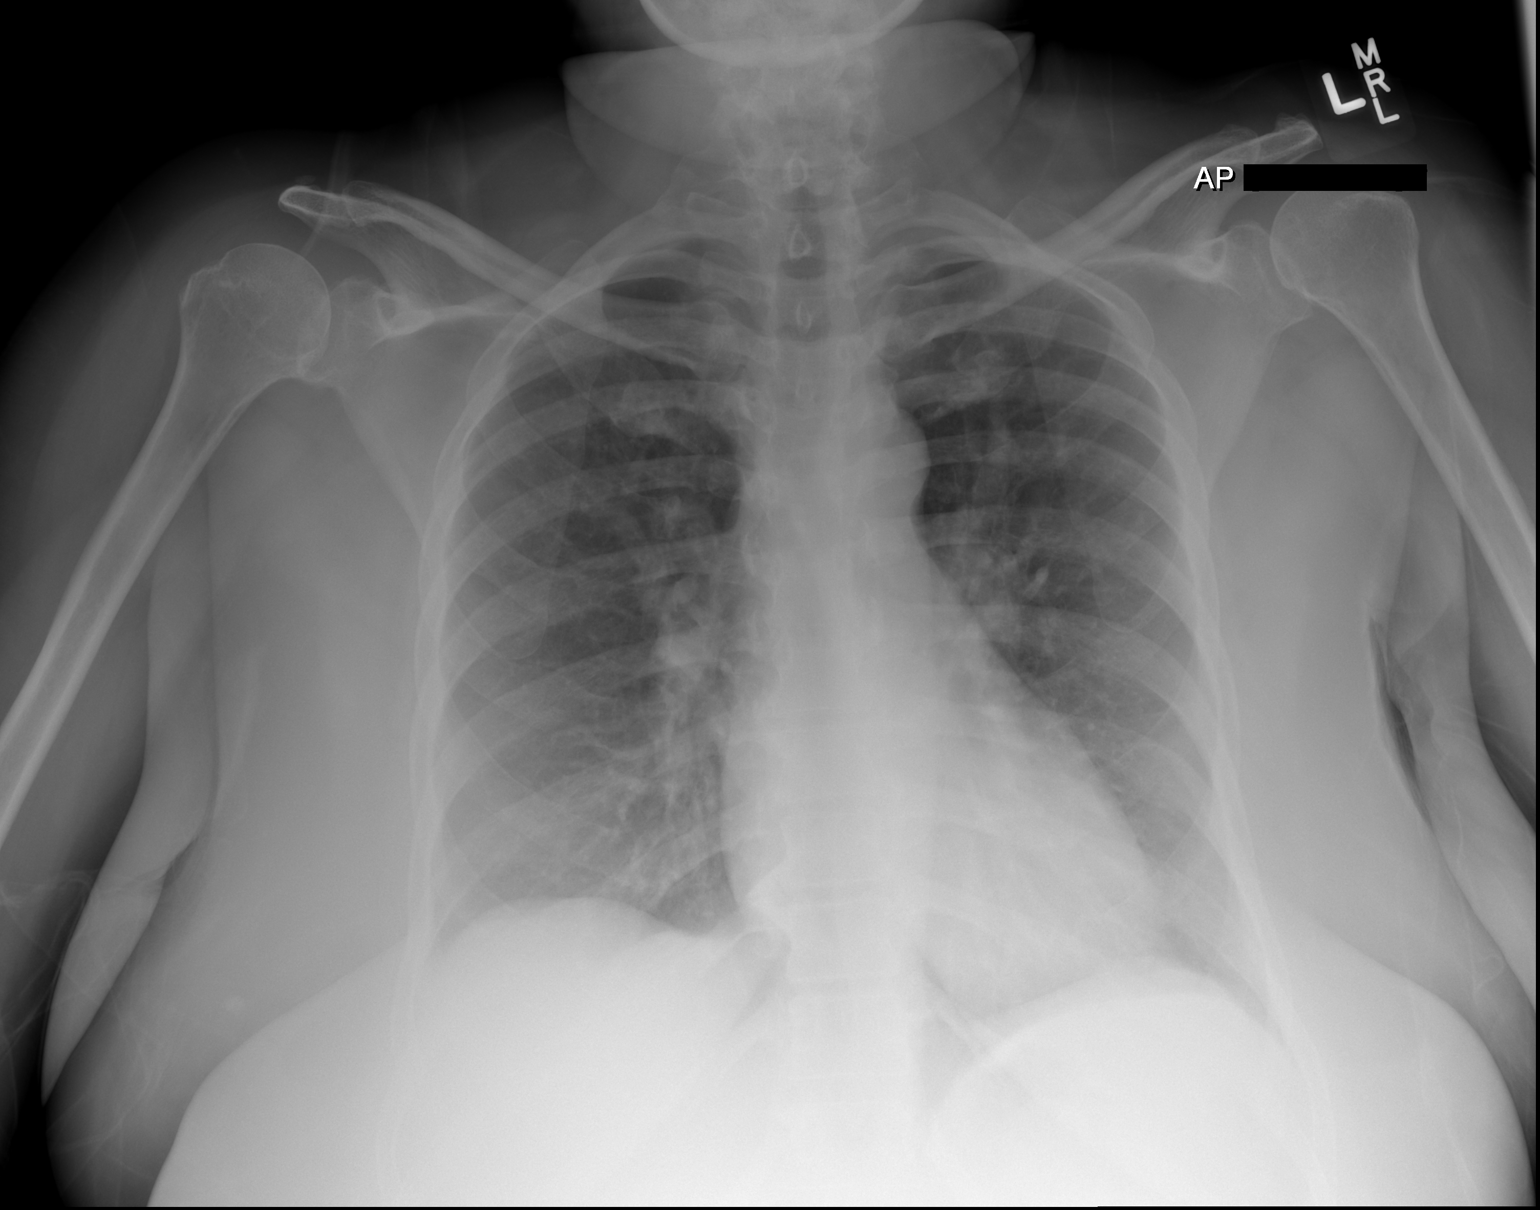

[2 of 2 positions shown; findings below may reference images not displayed]

FINDINGS: Grossly unchanged cardiac silhouette and mediastinal
contours.  No focal airspace opacities.  No pleural effusion or
pneumothorax.  Unchanged bones.  Post cholecystectomy.
IMPRESSION: No acute cardiopulmonary disease.

## 2014-03-07 ENCOUNTER — Ambulatory Visit: Payer: PRIVATE HEALTH INSURANCE | Admitting: Podiatrist

## 2014-03-17 ENCOUNTER — Encounter (HOSPITAL_COMMUNITY): Payer: Self-pay | Admitting: Emergency Medicine

## 2014-03-21 ENCOUNTER — Ambulatory Visit: Payer: PRIVATE HEALTH INSURANCE | Admitting: Podiatrist

## 2014-04-18 ENCOUNTER — Ambulatory Visit: Payer: PRIVATE HEALTH INSURANCE | Admitting: Podiatrist

## 2014-04-19 ENCOUNTER — Emergency Department (HOSPITAL_COMMUNITY)
Admission: EM | Admit: 2014-04-19 | Discharge: 2014-04-19 | Disposition: A | Discharge status: Home / Self Care | Payer: PRIVATE HEALTH INSURANCE | Attending: Emergency Medicine | Admitting: Emergency Medicine

## 2014-04-19 ENCOUNTER — Encounter (HOSPITAL_COMMUNITY): Payer: Self-pay | Admitting: Emergency Medicine

## 2014-04-19 DIAGNOSIS — E11649 Type 2 diabetes mellitus with hypoglycemia without coma: Secondary | ICD-10-CM | POA: Insufficient documentation

## 2014-04-19 DIAGNOSIS — Z794 Long term (current) use of insulin: Secondary | ICD-10-CM | POA: Diagnosis not present

## 2014-04-19 DIAGNOSIS — Z8739 Personal history of other diseases of the musculoskeletal system and connective tissue: Secondary | ICD-10-CM | POA: Insufficient documentation

## 2014-04-19 DIAGNOSIS — I10 Essential (primary) hypertension: Secondary | ICD-10-CM | POA: Diagnosis not present

## 2014-04-19 DIAGNOSIS — E162 Hypoglycemia, unspecified: Secondary | ICD-10-CM | POA: Diagnosis present

## 2014-04-19 DIAGNOSIS — Z87891 Personal history of nicotine dependence: Secondary | ICD-10-CM | POA: Insufficient documentation

## 2014-04-19 DIAGNOSIS — E785 Hyperlipidemia, unspecified: Secondary | ICD-10-CM | POA: Insufficient documentation

## 2014-04-19 DIAGNOSIS — Z7952 Long term (current) use of systemic steroids: Secondary | ICD-10-CM | POA: Insufficient documentation

## 2014-04-19 DIAGNOSIS — J45909 Unspecified asthma, uncomplicated: Secondary | ICD-10-CM | POA: Diagnosis not present

## 2014-04-19 DIAGNOSIS — H5441 Blindness, right eye, normal vision left eye: Secondary | ICD-10-CM | POA: Insufficient documentation

## 2014-04-19 DIAGNOSIS — Z79899 Other long term (current) drug therapy: Secondary | ICD-10-CM | POA: Diagnosis not present

## 2014-04-19 DIAGNOSIS — E039 Hypothyroidism, unspecified: Secondary | ICD-10-CM | POA: Diagnosis not present

## 2014-04-19 DIAGNOSIS — Z880 Allergy status to penicillin: Secondary | ICD-10-CM | POA: Diagnosis not present

## 2014-04-19 DIAGNOSIS — Z791 Long term (current) use of non-steroidal anti-inflammatories (NSAID): Secondary | ICD-10-CM | POA: Diagnosis not present

## 2014-04-19 DIAGNOSIS — E1140 Type 2 diabetes mellitus with diabetic neuropathy, unspecified: Secondary | ICD-10-CM | POA: Insufficient documentation

## 2014-04-19 LAB — BASIC METABOLIC PANEL
ANION GAP: 14 (ref 5–15)
BUN: 18 mg/dL (ref 6–23)
CALCIUM: 9.5 mg/dL (ref 8.4–10.5)
CHLORIDE: 105 meq/L (ref 96–112)
CO2: 23 mEq/L (ref 19–32)
Creatinine, Ser: 0.74 mg/dL (ref 0.50–1.10)
GFR calc Af Amer: >90 mL/min (ref >90)
GFR calc non Af Amer: >90 mL/min (ref >90)
Glucose, Bld: 132 mg/dL — ABNORMAL HIGH (ref 70–99)
Potassium: 3.5 mEq/L — ABNORMAL LOW (ref 3.7–5.3)
Sodium: 142 mEq/L (ref 137–147)

## 2014-04-19 LAB — CBC WITH DIFFERENTIAL/PLATELET
BASOS ABS: 0 10*3/uL (ref 0.0–0.1)
BASOS PCT: 0 % (ref 0–1)
Eosinophils Absolute: 0.1 10*3/uL (ref 0.0–0.7)
Eosinophils Relative: 1 % (ref 0–5)
HEMATOCRIT: 37.2 % (ref 36.0–46.0)
Hemoglobin: 12.4 g/dL (ref 12.0–15.0)
Lymphocytes Relative: 15 % (ref 12–46)
Lymphs Abs: 1.8 10*3/uL (ref 0.7–4.0)
MCH: 26.3 pg (ref 26.0–34.0)
MCHC: 33.3 g/dL (ref 30.0–36.0)
MCV: 79 fL (ref 78.0–100.0)
Monocytes Absolute: 0.9 10*3/uL (ref 0.1–1.0)
Monocytes Relative: 7 % (ref 3–12)
NEUTROS ABS: 9.6 10*3/uL — AB (ref 1.7–7.7)
Neutrophils Relative %: 78 % — ABNORMAL HIGH (ref 43–77)
Platelets: 208 10*3/uL (ref 150–400)
RBC: 4.71 MIL/uL (ref 3.87–5.11)
RDW: 14.5 % (ref 11.5–15.5)
WBC: 12.3 10*3/uL — ABNORMAL HIGH (ref 4.0–10.5)

## 2014-04-19 LAB — CBG MONITORING, ED
GLUCOSE-CAPILLARY: 137 mg/dL — AB (ref 70–99)
Glucose-Capillary: 158 mg/dL — ABNORMAL HIGH (ref 70–99)

## 2014-04-19 NOTE — ED Notes (Signed)
CBG registered 137 on ED glucometer

## 2014-04-19 NOTE — ED Provider Notes (Signed)
CSN: [id number]     Arrival date & time 04/19/14  [id number] History   First MD Initiated Contact with Patient 04/19/14 0957     Chief Complaint  Patient presents with  . Hypoglycemia     (Consider location/radiation/quality/duration/timing/severity/associated sxs/prior Treatment) HPI Comments: Patient with a history of Type II DM presents today with a chief complaint of hypoglycemia.  She reports that this morning she felt that her sugar was low and called EMS.  She states that she felt very dizzy and laid on the ground.  No LOC.  When EMS arrived her blood sugar was 33.  She was then given a sandwich, peanut butter, and oral glucose gel and her blood sugar went up to 88.  CBG upon arrival in the ED is 137.   She currently takes Trulicity weekly.  Last dose was five days ago.  She also takes Humulog at dinner time and Levimir in the evening before bed.  She states that she took 5 Units of Humulog and 40 Units of Levimir around 10 PM last evening.  She did not eat breakfast this morning.  The last time she had eaten anything before this hypoglycemic event was last evening around 6 PM.  She states that her blood sugar has been running in the 60's in the mornings and 70's at dinner time.  She did not check her blood sugar prior to taking the Levimir last evening.  She denies fever, chills, nausea, vomiting, diarrhea, abdominal pain, chest pain, cough, SOB, or urinary symptoms.  She denies decreased appetite ad reports that she is eating and drinking normally.    The history is provided by the patient.    Past Medical History  Diagnosis Date  . Asthma   . Diabetes mellitus   . Hypertension   . Blind   . Hypothyroidism   . Hyperlipidemia   . Diabetic neuropathy   . Blindness and low vision     left eye glass eye,  legally blind in right eye  . Spinal stenosis    Past Surgical History  Procedure Laterality Date  . Cesarean section      x 2  . Abdominal hysterectomy    . Cholecystectomy    .  Enucleation     Family History  Problem Relation Age of Onset  . Diabetes Sister    History  Substance Use Topics  . Smoking status: Former Smoker    Quit date: 05/22/1992  . Smokeless tobacco: Never Used  . Alcohol Use: No   OB History    Obstetric Comments   Pt has two sons.     Review of Systems  All other systems reviewed and are negative.     Allergies  Codeine; Iohexol; Penicillins; and Sulfa antibiotics  Home Medications   Prior to Admission medications   Medication Sig Start Date End Date Taking? Authorizing Provider  albuterol (PROVENTIL HFA;VENTOLIN HFA) 108 (90 BASE) MCG/ACT inhaler Inhale 2 puffs into the lungs every 4 (four) hours as needed. For shortness of breath    Historical Provider, MD  BUTRANS 10 MCG/HR PTWK patch Place 1 patch onto the skin every 7 (seven) days. 01/15/14   Historical Provider, MD  clobetasol cream (TEMOVATE) [id number] % Apply 1 application topically 2 (two) times daily.    Historical Provider, MD  cyclobenzaprine (FLEXERIL) 10 MG tablet Take 10 mg by mouth 3 (three) times daily as needed for muscle spasms.    Historical Provider, MD  Diclofenac Sodium 1.5 % SOLN  Apply 1 application topically as needed. 12/26/13   Historical Provider, MD  Dulaglutide (TRULICITY [place]) Inject 1.7 mLs into the skin once a week. Takes on Wednesday    Historical Provider, MD  esomeprazole (NEXIUM) 40 MG capsule Take 40 mg by mouth daily at 12 noon.    Historical Provider, MD  HUMALOG KWIKPEN 100 UNIT/ML KiwkPen Inject 5 Units into the skin daily before supper. 12/26/13   Historical Provider, MD  Insulin Detemir (LEVEMIR FLEXPEN) 100 UNIT/ML SOPN Inject 40 Units into the skin at bedtime. 01/02/13   Reyne Dumas, MD  levothyroxine (SYNTHROID, LEVOTHROID) 137 MCG tablet Take 1 tablet (137 mcg total) by mouth every morning. 02/05/13   Tresa Garter, MD  lidocaine (XYLOCAINE) 5 % ointment Apply 1 application topically as needed for mild pain.  12/26/13   Historical  Provider, MD  Linaclotide Rolan Lipa) 145 MCG CAPS capsule Take 145 mcg by mouth every morning.    Historical Provider, MD  metoCLOPramide (REGLAN) 10 MG tablet Take 1 tablet (10 mg total) by mouth every 6 (six) hours. 01/19/14   Garald Balding, NP  midodrine (PROAMATINE) 5 MG tablet Take 5 mg by mouth every morning.    Historical Provider, MD  montelukast (SINGULAIR) 10 MG tablet Take 10 mg by mouth at bedtime.    Historical Provider, MD  polyethylene glycol powder (GLYCOLAX/MIRALAX) powder Take 17 g by mouth daily. 12/16/13   Historical Provider, MD  potassium chloride SA (K-DUR,KLOR-CON) 20 MEQ tablet TAKE 1 TABLET BY MOUTH EVERY DAY 06/03/13   Shanker Kristeen Mans, MD  pregabalin (LYRICA) 100 MG capsule Take 100 mg by mouth 3 (three) times daily.    Historical Provider, MD  simvastatin (ZOCOR) 20 MG tablet Take 20 mg by mouth every morning.     Historical Provider, MD  triamcinolone cream (KENALOG) 0.1 % APPLY TO AFFECTED AREA TWICE DAILY 08/01/13   Theodis Blaze, MD   BP 133/71 mmHg  Temp(Src) 98 F (36.7 C) (Oral)  Resp 16  SpO2 99% Physical Exam  Constitutional: She appears well-developed and well-nourished.  HENT:  Head: Normocephalic and atraumatic.  Mouth/Throat: Oropharynx is clear and moist.  Eyes:  Left glass eye  Neck: Normal range of motion. Neck supple.  Cardiovascular: Normal rate, regular rhythm and normal heart sounds.   Pulmonary/Chest: Effort normal and breath sounds normal.  Abdominal: Soft. Bowel sounds are normal. She exhibits no distension and no mass. There is no tenderness. There is no rebound and no guarding.  Musculoskeletal: Normal range of motion.  Neurological: She is alert.  Skin: Skin is warm and dry.  Nursing note and vitals reviewed.   ED Course  Procedures (including critical care time) Labs Review Labs Reviewed  CBG MONITORING, ED - Abnormal; Notable for the following:    Glucose-Capillary 137 (*)    All other components within normal limits     Imaging Review No results found.   EKG Interpretation None     11:30 AM Repeat blood sugar 157.  Patient has been eating graham crackers and peanut butter.  She denies any symptoms at this time.  MDM   Final diagnoses:  None   Patient with a history of Type II DM currently on Insulin presents today with hypoglycemia.  CBG was found to be 33 upon arrival at the patient's home.  Blood sugar has been in the normal range during the entire ED course.  She has been eating and drinking while in the ED.  She is asymptomatic in  the ED.  VSS.  No signs or symptoms of infection.  Labs unremarkable.  Feel that the patient is stable for discharge.  Patient instructed to log her blood sugars and follow up with PCP.  Also instructed patient to decrease her dose of Levimir by 3 Units and to get regular meals.  Return precautions given.    Hyman Bible, PA-C 04/21/14 [place], PA-C 04/21/14 [id number]  Mariea Clonts, MD 04/23/14 [phone]

## 2014-04-19 NOTE — ED Notes (Signed)
Pt ambulated to restroom with 2 staff assist for guidance.

## 2014-04-19 NOTE — ED Notes (Signed)
Pt from home via [facility] c/o hypoglycemia. Pt reports that she pressed her life alert button upon EMS arrival patient was found in prone position with a CBG of 33. She was alert and able to drink 2c if grapejuice with sugar and PBJ sandwich and CBG came to 38. 2 oral glucose tabs given by [facility] CBG was then 80. Pt reports taking 5 u of Humalog last pm before bed.

## 2014-04-19 NOTE — Discharge Instructions (Signed)
Decrease your dose of Humulog to 37 Units.  Make sure you eat breakfast as soon as you wake up in the morning.   Hypoglycemia Hypoglycemia occurs when the glucose in your blood is too low. Glucose is a type of sugar that is your body's main energy source. Hormones, such as insulin and glucagon, control the level of glucose in the blood. Insulin lowers blood glucose and glucagon increases blood glucose. Having too much insulin in your blood stream, or not eating enough food containing sugar, can result in hypoglycemia. Hypoglycemia can happen to people with or without diabetes. It can develop quickly and can be a medical emergency.  CAUSES   Missing or delaying meals.  Not eating enough carbohydrates at meals.  Taking too much diabetes medicine.  Not timing your oral diabetes medicine or insulin doses with meals, snacks, and exercise.  Nausea and vomiting.  Certain medicines.  Severe illnesses, such as hepatitis, kidney disorders, and certain eating disorders.  Increased activity or exercise without eating something extra or adjusting medicines.  Drinking too much alcohol.  A nerve disorder that affects body functions like your heart rate, blood pressure, and digestion (autonomic neuropathy).  A condition where the stomach muscles do not function properly (gastroparesis). Therefore, medicines and food may not absorb properly.  Rarely, a tumor of the pancreas can produce too much insulin. SYMPTOMS   Hunger.  Sweating (diaphoresis).  Change in body temperature.  Shakiness.  Headache.  Anxiety.  Lightheadedness.  Irritability.  Difficulty concentrating.  Dry mouth.  Tingling or numbness in the hands or feet.  Restless sleep or sleep disturbances.  Altered speech and coordination.  Change in mental status.  Seizures or prolonged convulsions.  Combativeness.  Drowsiness (lethargic).  Weakness.  Increased heart rate or palpitations.  Confusion.  Pale,  gray skin color.  Blurred or double vision.  Fainting. DIAGNOSIS  A physical exam and medical history will be performed. Your caregiver may make a diagnosis based on your symptoms. Blood tests and other lab tests may be performed to confirm a diagnosis. Once the diagnosis is made, your caregiver will see if your signs and symptoms go away once your blood glucose is raised.  TREATMENT  Usually, you can easily treat your hypoglycemia when you notice symptoms.  Check your blood glucose. If it is less than 70 mg/dl, take one of the following:   3-4 glucose tablets.    cup juice.    cup regular soda.   1 cup skim milk.   -1 tube of glucose gel.   5-6 hard candies.   Avoid high-fat drinks or food that may delay a rise in blood glucose levels.  Do not take more than the recommended amount of sugary foods, drinks, gel, or tablets. Doing so will cause your blood glucose to go too high.   Wait 10-15 minutes and recheck your blood glucose. If it is still less than 70 mg/dl or below your target range, repeat treatment.   Eat a snack if it is more than 1 hour until your next meal.  There may be a time when your blood glucose may go so low that you are unable to treat yourself at home when you start to notice symptoms. You may need someone to help you. You may even faint or be unable to swallow. If you cannot treat yourself, someone will need to bring you to the hospital.  [place]  If you have diabetes, follow your diabetes management plan by:  Taking  your medicines as directed.  Following your exercise plan.  Following your meal plan. Do not skip meals. Eat on time.  Testing your blood glucose regularly. Check your blood glucose before and after exercise. If you exercise longer or different than usual, be sure to check blood glucose more frequently.  Wearing your medical alert jewelry that says you have diabetes.  Identify the cause of your hypoglycemia.  Then, develop ways to prevent the recurrence of hypoglycemia.  Do not take a hot bath or shower right after an insulin shot.  Always carry treatment with you. Glucose tablets are the easiest to carry.  If you are going to drink alcohol, drink it only with meals.  Tell friends or family members ways to keep you safe during a seizure. This may include removing hard or sharp objects from the area or turning you on your side.  Maintain a healthy weight. SEEK MEDICAL CARE IF:   You are having problems keeping your blood glucose in your target range.  You are having frequent episodes of hypoglycemia.  You feel you might be having side effects from your medicines.  You are not sure why your blood glucose is dropping so low.  You notice a change in vision or a new problem with your vision. SEEK IMMEDIATE MEDICAL CARE IF:   Confusion develops.  A change in mental status occurs.  The inability to swallow develops.  Fainting occurs. Document Released: 05/02/2005 Document Revised: 05/07/2013 Document Reviewed: 08/29/2011 [facility] Patient Information 2015 [place], [place]. This information is not intended to replace advice given to you by your health care provider. Make sure you discuss any questions you have with your health care provider.

## 2014-04-19 NOTE — ED Notes (Signed)
PA Heather at bedside.

## 2014-04-27 ENCOUNTER — Emergency Department (INDEPENDENT_AMBULATORY_CARE_PROVIDER_SITE_OTHER)
Admission: EM | Admit: 2014-04-27 | Discharge: 2014-04-27 | Disposition: A | Discharge status: Home / Self Care | Payer: PRIVATE HEALTH INSURANCE | Source: Home / Self Care | Attending: Family Medicine | Admitting: Family Medicine

## 2014-04-27 ENCOUNTER — Encounter (HOSPITAL_COMMUNITY): Payer: Self-pay | Admitting: Emergency Medicine

## 2014-04-27 DIAGNOSIS — H6123 Impacted cerumen, bilateral: Secondary | ICD-10-CM

## 2014-04-27 MED ORDER — CARBAMIDE PEROXIDE 6.5 % OT SOLN
OTIC | Status: AC
  Filled 2014-04-27: qty 15

## 2014-04-27 NOTE — Discharge Instructions (Signed)
Use debrox ear drops on mon and tues , return on wed for recheck.

## 2014-04-27 NOTE — ED Provider Notes (Signed)
CSN: [id number]     Arrival date & time 04/27/14  1541 History   First MD Initiated Contact with Patient 04/27/14 1556     Chief Complaint  Patient presents with  . Otalgia   (Consider location/radiation/quality/duration/timing/severity/associated sxs/prior Treatment) Patient is a 53 y.o. female presenting with ear pain. The history is provided by the patient.  Otalgia Location:  Left Quality:  Sore Severity:  Mild Onset quality:  Gradual Progression:  Worsening Chronicity:  New Relieved by:  None tried Worsened by:  Nothing tried Ineffective treatments:  None tried Associated symptoms: congestion   Associated symptoms: no ear discharge and no fever   Risk factors: no chronic ear infection     Past Medical History  Diagnosis Date  . Asthma   . Diabetes mellitus   . Hypertension   . Blind   . Hypothyroidism   . Hyperlipidemia   . Diabetic neuropathy   . Blindness and low vision     left eye glass eye,  legally blind in right eye  . Spinal stenosis    Past Surgical History  Procedure Laterality Date  . Cesarean section      x 2  . Abdominal hysterectomy    . Cholecystectomy    . Enucleation     Family History  Problem Relation Age of Onset  . Diabetes Sister    History  Substance Use Topics  . Smoking status: Former Smoker    Quit date: 05/22/1992  . Smokeless tobacco: Never Used  . Alcohol Use: No   OB History    Obstetric Comments   Pt has two sons.     Review of Systems  Constitutional: Negative.  Negative for fever.  HENT: Positive for congestion and ear pain. Negative for ear discharge and facial swelling.   Eyes: Negative.     Allergies  Codeine; Iohexol; Penicillins; and Sulfa antibiotics  Home Medications   Prior to Admission medications   Medication Sig Start Date End Date Taking? Authorizing Provider  albuterol (PROVENTIL HFA;VENTOLIN HFA) 108 (90 BASE) MCG/ACT inhaler Inhale 2 puffs into the lungs every 4 (four) hours as needed. For  shortness of breath    Historical Provider, MD  BUTRANS 10 MCG/HR PTWK patch Place 1 patch onto the skin every 7 (seven) days. 01/15/14   Historical Provider, MD  clobetasol cream (TEMOVATE) [id number] % Apply 1 application topically 2 (two) times daily.    Historical Provider, MD  Dulaglutide (TRULICITY [place]) Inject 1.7 mLs into the skin once a week. Takes on Wednesday    Historical Provider, MD  esomeprazole (NEXIUM) 40 MG capsule Take 40 mg by mouth every morning.     Historical Provider, MD  furosemide (LASIX) 20 MG tablet Take 20 mg by mouth at bedtime.    Historical Provider, MD  HUMALOG KWIKPEN 100 UNIT/ML KiwkPen Inject 5 Units into the skin daily before supper. 12/26/13   Historical Provider, MD  insulin detemir (LEVEMIR) 100 unit/ml SOLN Inject 40 Units into the skin at bedtime.    Historical Provider, MD  levothyroxine (SYNTHROID, LEVOTHROID) 137 MCG tablet Take 137 mcg by mouth daily before breakfast.    Historical Provider, MD  lidocaine (XYLOCAINE) 5 % ointment Apply 1 application topically as needed for mild pain.  12/26/13   Historical Provider, MD  Linaclotide Rolan Lipa) 145 MCG CAPS capsule Take 145 mcg by mouth every morning.    Historical Provider, MD  midodrine (PROAMATINE) 5 MG tablet Take 5 mg by mouth 3 (three) times daily with  meals.     Historical Provider, MD  montelukast (SINGULAIR) 10 MG tablet Take 10 mg by mouth at bedtime.    Historical Provider, MD  polyethylene glycol powder (GLYCOLAX/MIRALAX) powder Take 17 g by mouth daily. 12/16/13   Historical Provider, MD  potassium chloride SA (K-DUR,KLOR-CON) 20 MEQ tablet Take 20 mEq by mouth every morning.    Historical Provider, MD  pregabalin (LYRICA) 100 MG capsule Take 100 mg by mouth 3 (three) times daily.    Historical Provider, MD  simvastatin (ZOCOR) 20 MG tablet Take 20 mg by mouth at bedtime.     Historical Provider, MD   BP 121/80 mmHg  Pulse 86  Temp(Src) 98.5 F (36.9 C) (Oral)  Resp 18  SpO2 99% Physical Exam   Constitutional: She appears well-developed and well-nourished.  HENT:  Head: Normocephalic.  Mouth/Throat: Oropharynx is clear and moist.  Eyes: Conjunctivae are normal. Pupils are equal, round, and reactive to light.  Nursing note and vitals reviewed.   ED Course  Procedures (including critical care time) Labs Review Labs Reviewed - No data to display  Imaging Review No results found.   MDM   1. Cerumen impaction, bilateral    Unable to clean canals with irrig., will use debrox at home and recheck.    Billy Fischer, MD 04/29/14 [phone]

## 2014-04-27 NOTE — ED Notes (Signed)
Pt states that her ears feel clogged and are painful along with numbness on the left sided

## 2014-05-16 HISTORY — PX: EYE SURGERY: SHX253

## 2014-05-30 ENCOUNTER — Ambulatory Visit (INDEPENDENT_AMBULATORY_CARE_PROVIDER_SITE_OTHER): Payer: 59 | Admitting: Podiatrist

## 2014-05-30 ENCOUNTER — Encounter: Payer: Self-pay | Admitting: Podiatrist

## 2014-05-30 DIAGNOSIS — B351 Tinea unguium: Secondary | ICD-10-CM

## 2014-05-30 DIAGNOSIS — M79676 Pain in unspecified toe(s): Secondary | ICD-10-CM

## 2014-05-30 DIAGNOSIS — E1140 Type 2 diabetes mellitus with diabetic neuropathy, unspecified: Secondary | ICD-10-CM

## 2014-05-30 NOTE — Patient Instructions (Signed)
Diabetes and Foot Care Diabetes may cause you to have problems because of poor blood supply (circulation) to your feet and legs. This may cause the skin on your feet to become thinner, break easier, and heal more slowly. Your skin may become dry, and the skin may peel and crack. You may also have nerve damage in your legs and feet causing decreased feeling in them. You may not notice minor injuries to your feet that could lead to infections or more serious problems. Taking care of your feet is one of the most important things you can do for yourself.  HOME CARE INSTRUCTIONS  Wear shoes at all times, even in the house. Do not go barefoot. Bare feet are easily injured.  Check your feet daily for blisters, cuts, and redness. If you cannot see the bottom of your feet, use a mirror or ask someone for help.  Wash your feet with warm water (do not use hot water) and mild soap. Then pat your feet and the areas between your toes until they are completely dry. Do not soak your feet as this can dry your skin.  Apply a moisturizing lotion or petroleum jelly (that does not contain alcohol and is unscented) to the skin on your feet and to dry, brittle toenails. Do not apply lotion between your toes.  Trim your toenails straight across. Do not dig under them or around the cuticle. File the edges of your nails with an emery board or nail file.  Do not cut corns or calluses or try to remove them with medicine.  Wear clean socks or stockings every day. Make sure they are not too tight. Do not wear knee-high stockings since they may decrease blood flow to your legs.  Wear shoes that fit properly and have enough cushioning. To break in new shoes, wear them for just a few hours a day. This prevents you from injuring your feet. Always look in your shoes before you put them on to be sure there are no objects inside.  Do not cross your legs. This may decrease the blood flow to your feet.  If you find a minor scrape,  cut, or break in the skin on your feet, keep it and the skin around it clean and dry. These areas may be cleansed with mild soap and water. Do not cleanse the area with peroxide, alcohol, or iodine.  When you remove an adhesive bandage, be sure not to damage the skin around it.  If you have a wound, look at it several times a day to make sure it is healing.  Do not use heating pads or hot water bottles. They may burn your skin. If you have lost feeling in your feet or legs, you may not know it is happening until it is too late.  Make sure your health care provider performs a complete foot exam at least annually or more often if you have foot problems. Report any cuts, sores, or bruises to your health care provider immediately. SEEK MEDICAL CARE IF:   You have an injury that is not healing.  You have cuts or breaks in the skin.  You have an ingrown nail.  You notice redness on your legs or feet.  You feel burning or tingling in your legs or feet.  You have pain or cramps in your legs and feet.  Your legs or feet are numb.  Your feet always feel cold. SEEK IMMEDIATE MEDICAL CARE IF:   There is increasing redness,   swelling, or pain in or around a wound.  There is a red line that goes up your leg.  Pus is coming from a wound.  You develop a fever or as directed by your health care provider.  You notice a bad smell coming from an ulcer or wound. Document Released: 04/29/2000 Document Revised: 01/02/2013 Document Reviewed: 10/09/2012 [employer] Patient Information 2015 [employer]. This information is not intended to replace advice given to you by your health care provider. Make sure you discuss any questions you have with your health care provider.  

## 2014-05-30 NOTE — Progress Notes (Signed)
Subjective: Patient presents today for continued diabetic foot and nail care. The patient is blind and She relates that she needs new diabetic shoes and with like to see about getting a pair. She also relates neuropathy due to diabetes which is stable at this time. Tingling and numbness are reported per the patient.   Objective: Vascular status continues to be intact with palpable pedal pulses at two out of four DP and PT bilateral.  Normal proximal to distal cooling is noted bilateral. Neurological sensation is decreased via Semmes Weinstein monofilament at 2/5 sites bilateral. Light touch, vibration, are also decreased as well bilateral. Patient's toenails are elongated, thickened, with ingrown deformity present and discomfort with palpation and debridement. Musculoskeletal examination reveals mildly pronated foot type. Range of motion of first metatarsophalangeal joint is adequate. No contracture of digits is seen.   Assessment: Diabetes with neuropathy, symptomatic mycotic toenails   Plan: Agreed that with her diabetes and her neuropathy she would benefit from some diabetic shoes for ulcer prevention. and we will contact Dr. Durward Fortes at [facility] to see if he would like for her to have these. I debrided her toenails without complication and she'll be seen back for routine appointments in 3 months. If she has any problems or concerns prior that visit she is instructed to call me immediately.

## 2014-08-08 ENCOUNTER — Emergency Department (HOSPITAL_COMMUNITY): Payer: Medicare Other

## 2014-08-08 ENCOUNTER — Encounter (HOSPITAL_COMMUNITY): Payer: Self-pay | Admitting: *Deleted

## 2014-08-08 ENCOUNTER — Inpatient Hospital Stay (HOSPITAL_COMMUNITY)
Admission: EM | Admit: 2014-08-08 | Discharge: 2014-08-12 | DRG: 637 | Disposition: A | Discharge status: Home / Self Care | Payer: Medicare Other | Attending: Internal Medicine | Admitting: Internal Medicine

## 2014-08-08 ENCOUNTER — Emergency Department (INDEPENDENT_AMBULATORY_CARE_PROVIDER_SITE_OTHER)
Admission: EM | Admit: 2014-08-08 | Discharge: 2014-08-08 | Disposition: A | Discharge status: Other Acute Inpatient Hospital | Payer: Medicare Other | Source: Home / Self Care | Attending: Family Medicine | Admitting: Family Medicine

## 2014-08-08 ENCOUNTER — Encounter (HOSPITAL_COMMUNITY): Payer: Self-pay | Admitting: Family Medicine

## 2014-08-08 DIAGNOSIS — Z794 Long term (current) use of insulin: Secondary | ICD-10-CM | POA: Diagnosis present

## 2014-08-08 DIAGNOSIS — R42 Dizziness and giddiness: Secondary | ICD-10-CM

## 2014-08-08 DIAGNOSIS — R739 Hyperglycemia, unspecified: Secondary | ICD-10-CM

## 2014-08-08 DIAGNOSIS — J45909 Unspecified asthma, uncomplicated: Secondary | ICD-10-CM | POA: Diagnosis present

## 2014-08-08 DIAGNOSIS — J208 Acute bronchitis due to other specified organisms: Secondary | ICD-10-CM | POA: Diagnosis present

## 2014-08-08 DIAGNOSIS — R197 Diarrhea, unspecified: Secondary | ICD-10-CM | POA: Diagnosis present

## 2014-08-08 DIAGNOSIS — K3184 Gastroparesis: Secondary | ICD-10-CM

## 2014-08-08 DIAGNOSIS — E785 Hyperlipidemia, unspecified: Secondary | ICD-10-CM | POA: Diagnosis present

## 2014-08-08 DIAGNOSIS — E860 Dehydration: Secondary | ICD-10-CM

## 2014-08-08 DIAGNOSIS — E1100 Type 2 diabetes mellitus with hyperosmolarity without nonketotic hyperglycemic-hyperosmolar coma (NKHHC): Secondary | ICD-10-CM | POA: Diagnosis present

## 2014-08-08 DIAGNOSIS — H547 Unspecified visual loss: Secondary | ICD-10-CM

## 2014-08-08 DIAGNOSIS — R778 Other specified abnormalities of plasma proteins: Secondary | ICD-10-CM | POA: Diagnosis present

## 2014-08-08 DIAGNOSIS — R112 Nausea with vomiting, unspecified: Secondary | ICD-10-CM | POA: Diagnosis not present

## 2014-08-08 DIAGNOSIS — E1140 Type 2 diabetes mellitus with diabetic neuropathy, unspecified: Secondary | ICD-10-CM | POA: Diagnosis present

## 2014-08-08 DIAGNOSIS — E870 Hyperosmolality and hypernatremia: Secondary | ICD-10-CM

## 2014-08-08 DIAGNOSIS — Z90710 Acquired absence of both cervix and uterus: Secondary | ICD-10-CM

## 2014-08-08 DIAGNOSIS — R0682 Tachypnea, not elsewhere classified: Secondary | ICD-10-CM

## 2014-08-08 DIAGNOSIS — I951 Orthostatic hypotension: Secondary | ICD-10-CM

## 2014-08-08 DIAGNOSIS — E162 Hypoglycemia, unspecified: Secondary | ICD-10-CM

## 2014-08-08 DIAGNOSIS — Z880 Allergy status to penicillin: Secondary | ICD-10-CM

## 2014-08-08 DIAGNOSIS — IMO0002 Reserved for concepts with insufficient information to code with codable children: Secondary | ICD-10-CM

## 2014-08-08 DIAGNOSIS — J45901 Unspecified asthma with (acute) exacerbation: Secondary | ICD-10-CM

## 2014-08-08 DIAGNOSIS — E1165 Type 2 diabetes mellitus with hyperglycemia: Principal | ICD-10-CM | POA: Diagnosis present

## 2014-08-08 DIAGNOSIS — R7989 Other specified abnormal findings of blood chemistry: Secondary | ICD-10-CM | POA: Diagnosis not present

## 2014-08-08 DIAGNOSIS — Z87891 Personal history of nicotine dependence: Secondary | ICD-10-CM | POA: Diagnosis not present

## 2014-08-08 DIAGNOSIS — I10 Essential (primary) hypertension: Secondary | ICD-10-CM | POA: Diagnosis present

## 2014-08-08 DIAGNOSIS — E039 Hypothyroidism, unspecified: Secondary | ICD-10-CM | POA: Diagnosis present

## 2014-08-08 DIAGNOSIS — H540 Blindness, both eyes: Secondary | ICD-10-CM | POA: Diagnosis present

## 2014-08-08 DIAGNOSIS — Z885 Allergy status to narcotic agent status: Secondary | ICD-10-CM | POA: Diagnosis not present

## 2014-08-08 DIAGNOSIS — J40 Bronchitis, not specified as acute or chronic: Secondary | ICD-10-CM

## 2014-08-08 DIAGNOSIS — Z882 Allergy status to sulfonamides status: Secondary | ICD-10-CM

## 2014-08-08 DIAGNOSIS — J101 Influenza due to other identified influenza virus with other respiratory manifestations: Secondary | ICD-10-CM | POA: Diagnosis present

## 2014-08-08 DIAGNOSIS — N179 Acute kidney failure, unspecified: Secondary | ICD-10-CM

## 2014-08-08 DIAGNOSIS — M4800 Spinal stenosis, site unspecified: Secondary | ICD-10-CM | POA: Diagnosis present

## 2014-08-08 DIAGNOSIS — K529 Noninfective gastroenteritis and colitis, unspecified: Secondary | ICD-10-CM

## 2014-08-08 DIAGNOSIS — N289 Disorder of kidney and ureter, unspecified: Secondary | ICD-10-CM

## 2014-08-08 DIAGNOSIS — B370 Candidal stomatitis: Secondary | ICD-10-CM

## 2014-08-08 DIAGNOSIS — N390 Urinary tract infection, site not specified: Secondary | ICD-10-CM

## 2014-08-08 DIAGNOSIS — E118 Type 2 diabetes mellitus with unspecified complications: Secondary | ICD-10-CM | POA: Diagnosis present

## 2014-08-08 LAB — COMPREHENSIVE METABOLIC PANEL
ALBUMIN: 3 g/dL — AB (ref 3.5–5.2)
ALBUMIN: 2.9 g/dL — AB (ref 3.5–5.2)
ALK PHOS: 112 U/L (ref 39–117)
ALK PHOS: 106 U/L (ref 39–117)
ALT: 14 U/L (ref 0–35)
ALT: 14 U/L (ref 0–35)
ANION GAP: 14 (ref 5–15)
AST: 14 U/L (ref 0–37)
AST: 18 U/L (ref 0–37)
Anion gap: 8 (ref 5–15)
BILIRUBIN TOTAL: 0.6 mg/dL (ref 0.3–1.2)
BUN: 20 mg/dL (ref 6–23)
BUN: 20 mg/dL (ref 6–23)
CHLORIDE: 99 mmol/L (ref 96–112)
CHLORIDE: 102 mmol/L (ref 96–112)
CO2: 29 mmol/L (ref 19–32)
CO2: 22 mmol/L (ref 19–32)
CREATININE: 1.37 mg/dL — AB (ref 0.50–1.10)
Calcium: 8.7 mg/dL (ref 8.4–10.5)
Calcium: 8.8 mg/dL (ref 8.4–10.5)
Creatinine, Ser: 1.18 mg/dL — ABNORMAL HIGH (ref 0.50–1.10)
GFR calc Af Amer: 49 mL/min — ABNORMAL LOW (ref >90)
GFR calc non Af Amer: 43 mL/min — ABNORMAL LOW (ref >90)
GFR calc non Af Amer: 51 mL/min — ABNORMAL LOW (ref >90)
GFR, EST AFRICAN AMERICAN: 59 mL/min — AB (ref >90)
Glucose, Bld: 640 mg/dL (ref 70–99)
Glucose, Bld: 501 mg/dL — ABNORMAL HIGH (ref 70–99)
POTASSIUM: 3.9 mmol/L (ref 3.5–5.1)
POTASSIUM: 3.7 mmol/L (ref 3.5–5.1)
Sodium: 136 mmol/L (ref 135–145)
Sodium: 138 mmol/L (ref 135–145)
Total Bilirubin: 0.5 mg/dL (ref 0.3–1.2)
Total Protein: 6.8 g/dL (ref 6.0–8.3)
Total Protein: 6.7 g/dL (ref 6.0–8.3)

## 2014-08-08 LAB — GLUCOSE, CAPILLARY
GLUCOSE-CAPILLARY: 205 mg/dL — AB (ref 70–99)
GLUCOSE-CAPILLARY: 171 mg/dL — AB (ref 70–99)
Glucose-Capillary: 534 mg/dL — ABNORMAL HIGH (ref 70–99)
Glucose-Capillary: 184 mg/dL — ABNORMAL HIGH (ref 70–99)
Glucose-Capillary: 134 mg/dL — ABNORMAL HIGH (ref 70–99)

## 2014-08-08 LAB — CBC WITH DIFFERENTIAL/PLATELET
BASOS ABS: 0 10*3/uL (ref 0.0–0.1)
BASOS PCT: 0 % (ref 0–1)
Basophils Absolute: 0 10*3/uL (ref 0.0–0.1)
Basophils Relative: 0 % (ref 0–1)
EOS PCT: 1 % (ref 0–5)
Eosinophils Absolute: 0.1 10*3/uL (ref 0.0–0.7)
Eosinophils Absolute: 0.1 10*3/uL (ref 0.0–0.7)
Eosinophils Relative: 1 % (ref 0–5)
HEMATOCRIT: 39 % (ref 36.0–46.0)
HEMATOCRIT: 38.6 % (ref 36.0–46.0)
HEMOGLOBIN: 13.1 g/dL (ref 12.0–15.0)
Hemoglobin: 12.8 g/dL (ref 12.0–15.0)
LYMPHS ABS: 1.3 10*3/uL (ref 0.7–4.0)
LYMPHS PCT: 26 % (ref 12–46)
Lymphocytes Relative: 20 % (ref 12–46)
Lymphs Abs: 1.8 10*3/uL (ref 0.7–4.0)
MCH: 26.2 pg (ref 26.0–34.0)
MCH: 26.7 pg (ref 26.0–34.0)
MCHC: 32.8 g/dL (ref 30.0–36.0)
MCHC: 33.9 g/dL (ref 30.0–36.0)
MCV: 79.8 fL (ref 78.0–100.0)
MCV: 78.6 fL (ref 78.0–100.0)
MONO ABS: 1.3 10*3/uL — AB (ref 0.1–1.0)
Monocytes Absolute: 1.2 10*3/uL — ABNORMAL HIGH (ref 0.1–1.0)
Monocytes Relative: 20 % — ABNORMAL HIGH (ref 3–12)
Monocytes Relative: 17 % — ABNORMAL HIGH (ref 3–12)
Neutro Abs: 4 10*3/uL (ref 1.7–7.7)
Neutro Abs: 4 10*3/uL (ref 1.7–7.7)
Neutrophils Relative %: 60 % (ref 43–77)
Neutrophils Relative %: 56 % (ref 43–77)
PLATELETS: 135 10*3/uL — AB (ref 150–400)
Platelets: 137 10*3/uL — ABNORMAL LOW (ref 150–400)
RBC: 4.89 MIL/uL (ref 3.87–5.11)
RBC: 4.91 MIL/uL (ref 3.87–5.11)
RDW: 14.8 % (ref 11.5–15.5)
RDW: 14.7 % (ref 11.5–15.5)
WBC: 6.7 10*3/uL (ref 4.0–10.5)
WBC: 7.1 10*3/uL (ref 4.0–10.5)

## 2014-08-08 LAB — URINALYSIS, ROUTINE W REFLEX MICROSCOPIC
BILIRUBIN URINE: NEGATIVE
Glucose, UA: >1000 mg/dL — AB
Ketones, ur: NEGATIVE mg/dL
LEUKOCYTES UA: NEGATIVE
Nitrite: NEGATIVE
PH: 5.5 (ref 5.0–8.0)
Protein, ur: NEGATIVE mg/dL
SPECIFIC GRAVITY, URINE: 1.026 (ref 1.005–1.030)
Urobilinogen, UA: 0.2 mg/dL (ref 0.0–1.0)

## 2014-08-08 LAB — POCT I-STAT, CHEM 8
BUN: 22 mg/dL (ref 6–23)
CALCIUM ION: 1.12 mmol/L (ref 1.12–1.23)
CREATININE: 1.2 mg/dL — AB (ref 0.50–1.10)
Chloride: 99 mmol/L (ref 96–112)
Glucose, Bld: 632 mg/dL (ref 70–99)
HCT: 43 % (ref 36.0–46.0)
Hemoglobin: 14.6 g/dL (ref 12.0–15.0)
Potassium: 4.2 mmol/L (ref 3.5–5.1)
Sodium: 137 mmol/L (ref 135–145)
TCO2: 26 mmol/L (ref 0–100)

## 2014-08-08 LAB — TROPONIN I
Troponin I: 0.04 ng/mL — ABNORMAL HIGH (ref <0.031)
Troponin I: <0.03 ng/mL (ref <0.031)

## 2014-08-08 LAB — I-STAT ARTERIAL BLOOD GAS, ED
Acid-base deficit: 2 mmol/L (ref 0.0–2.0)
Bicarbonate: 20 mEq/L (ref 20.0–24.0)
O2 SAT: 97 %
TCO2: 21 mmol/L (ref 0–100)
pCO2 arterial: 25.5 mmHg — ABNORMAL LOW (ref 35.0–45.0)
pH, Arterial: 7.502 — ABNORMAL HIGH (ref 7.350–7.450)
pO2, Arterial: 78 mmHg — ABNORMAL LOW (ref 80.0–100.0)

## 2014-08-08 LAB — URINE MICROSCOPIC-ADD ON

## 2014-08-08 LAB — CBG MONITORING, ED
GLUCOSE-CAPILLARY: 401 mg/dL — AB (ref 70–99)
Glucose-Capillary: 294 mg/dL — ABNORMAL HIGH (ref 70–99)

## 2014-08-08 MED ORDER — GUAIFENESIN ER 600 MG PO TB12
600.0000 mg | ORAL_TABLET | Freq: Two times a day (BID) | ORAL | Status: DC
  Administered 2014-08-08 – 2014-08-11 (×6): 600 mg via ORAL
  Filled 2014-08-08 (×7): qty 1

## 2014-08-08 MED ORDER — INSULIN DETEMIR 100 UNIT/ML ~~LOC~~ SOLN
40.0000 [IU] | Freq: Every day | SUBCUTANEOUS | Status: DC
  Filled 2014-08-08 (×2): qty 0.4

## 2014-08-08 MED ORDER — AEROCHAMBER PLUS FLO-VU LARGE MISC: 1.0000 | Freq: Once | Status: DC

## 2014-08-08 MED ORDER — PREGABALIN 100 MG PO CAPS
100.0000 mg | ORAL_CAPSULE | Freq: Three times a day (TID) | ORAL | Status: DC
  Administered 2014-08-08 – 2014-08-12 (×13): 100 mg via ORAL
  Filled 2014-08-08 (×13): qty 1

## 2014-08-08 MED ORDER — ALBUTEROL SULFATE HFA 108 (90 BASE) MCG/ACT IN AERS
INHALATION_SPRAY | RESPIRATORY_TRACT | Status: AC
  Filled 2014-08-08: qty 6.7

## 2014-08-08 MED ORDER — DEXTROSE-NACL 5-0.45 % IV SOLN
INTRAVENOUS | Status: DC
  Administered 2014-08-08: 15:00:00 via INTRAVENOUS

## 2014-08-08 MED ORDER — ONDANSETRON HCL 4 MG PO TABS: 4.0000 mg | ORAL_TABLET | Freq: Four times a day (QID) | ORAL | Status: DC | PRN

## 2014-08-08 MED ORDER — LEVOTHYROXINE SODIUM 137 MCG PO TABS
137.0000 ug | ORAL_TABLET | Freq: Every day | ORAL | Status: DC
  Administered 2014-08-09 – 2014-08-12 (×4): 137 ug via ORAL
  Filled 2014-08-08 (×5): qty 1

## 2014-08-08 MED ORDER — ONDANSETRON HCL 4 MG/2ML IJ SOLN: 4.0000 mg | Freq: Four times a day (QID) | INTRAMUSCULAR | Status: DC | PRN

## 2014-08-08 MED ORDER — SODIUM CHLORIDE 0.9 % IV SOLN
INTRAVENOUS | Status: DC
  Administered 2014-08-08: 13:00:00 via INTRAVENOUS

## 2014-08-08 MED ORDER — INSULIN DETEMIR 100 UNIT/ML FLEXPEN: 40.0000 [IU] | Freq: Every day | SUBCUTANEOUS | Status: DC

## 2014-08-08 MED ORDER — ALBUTEROL SULFATE (2.5 MG/3ML) 0.083% IN NEBU: 2.5000 mg | INHALATION_SOLUTION | RESPIRATORY_TRACT | Status: DC | PRN

## 2014-08-08 MED ORDER — INSULIN DETEMIR 100 UNIT/ML ~~LOC~~ SOLN
20.0000 [IU] | Freq: Once | SUBCUTANEOUS | Status: AC
  Administered 2014-08-08: 20 [IU] via SUBCUTANEOUS
  Filled 2014-08-08: qty 0.2

## 2014-08-08 MED ORDER — GUAIFENESIN-DM 100-10 MG/5ML PO SYRP
5.0000 mL | ORAL_SOLUTION | ORAL | Status: DC | PRN
  Administered 2014-08-08 – 2014-08-09 (×2): 5 mL via ORAL
  Filled 2014-08-08 (×4): qty 5

## 2014-08-08 MED ORDER — SODIUM CHLORIDE 0.9 % IV SOLN
INTRAVENOUS | Status: DC
  Administered 2014-08-08: 2.3 [IU]/h via INTRAVENOUS
  Filled 2014-08-08: qty 2.5

## 2014-08-08 MED ORDER — MIDODRINE HCL 5 MG PO TABS
5.0000 mg | ORAL_TABLET | Freq: Three times a day (TID) | ORAL | Status: DC
  Administered 2014-08-08 – 2014-08-12 (×12): 5 mg via ORAL
  Filled 2014-08-08 (×14): qty 1

## 2014-08-08 MED ORDER — IPRATROPIUM-ALBUTEROL 0.5-2.5 (3) MG/3ML IN SOLN
3.0000 mL | Freq: Once | RESPIRATORY_TRACT | Status: AC
  Administered 2014-08-08: 3 mL via RESPIRATORY_TRACT

## 2014-08-08 MED ORDER — ALBUTEROL SULFATE (2.5 MG/3ML) 0.083% IN NEBU
2.5000 mg | INHALATION_SOLUTION | Freq: Once | RESPIRATORY_TRACT | Status: AC
  Administered 2014-08-08: 2.5 mg via RESPIRATORY_TRACT

## 2014-08-08 MED ORDER — INSULIN REGULAR BOLUS VIA INFUSION
0.0000 [IU] | Freq: Three times a day (TID) | INTRAVENOUS | Status: DC
  Filled 2014-08-08: qty 10

## 2014-08-08 MED ORDER — ALBUTEROL SULFATE (2.5 MG/3ML) 0.083% IN NEBU
INHALATION_SOLUTION | RESPIRATORY_TRACT | Status: AC
  Filled 2014-08-08: qty 3

## 2014-08-08 MED ORDER — INSULIN ASPART 100 UNIT/ML ~~LOC~~ SOLN
10.0000 [IU] | Freq: Once | SUBCUTANEOUS | Status: AC
  Administered 2014-08-08: 10 [IU] via SUBCUTANEOUS

## 2014-08-08 MED ORDER — IPRATROPIUM-ALBUTEROL 0.5-2.5 (3) MG/3ML IN SOLN
RESPIRATORY_TRACT | Status: AC
  Filled 2014-08-08: qty 3

## 2014-08-08 MED ORDER — SIMVASTATIN 20 MG PO TABS
20.0000 mg | ORAL_TABLET | Freq: Every day | ORAL | Status: DC
  Administered 2014-08-08 – 2014-08-11 (×4): 20 mg via ORAL
  Filled 2014-08-08 (×5): qty 1

## 2014-08-08 MED ORDER — INSULIN ASPART 100 UNIT/ML ~~LOC~~ SOLN
0.0000 [IU] | Freq: Every day | SUBCUTANEOUS | Status: DC
  Administered 2014-08-10: 2 [IU] via SUBCUTANEOUS

## 2014-08-08 MED ORDER — SODIUM CHLORIDE 0.9 % IV BOLUS (SEPSIS)
1000.0000 mL | Freq: Once | INTRAVENOUS | Status: AC
  Administered 2014-08-08: 1000 mL via INTRAVENOUS

## 2014-08-08 MED ORDER — POTASSIUM CHLORIDE IN NACL 20-0.9 MEQ/L-% IV SOLN
INTRAVENOUS | Status: AC
  Administered 2014-08-08 – 2014-08-09 (×3): via INTRAVENOUS
  Filled 2014-08-08 (×3): qty 1000

## 2014-08-08 MED ORDER — SODIUM CHLORIDE 0.9 % IV BOLUS (SEPSIS)
250.0000 mL | Freq: Once | INTRAVENOUS | Status: AC
  Administered 2014-08-08: 250 mL via INTRAVENOUS

## 2014-08-08 MED ORDER — ACETAMINOPHEN 325 MG PO TABS
650.0000 mg | ORAL_TABLET | Freq: Four times a day (QID) | ORAL | Status: DC | PRN
  Administered 2014-08-09: 650 mg via ORAL
  Filled 2014-08-08: qty 2

## 2014-08-08 MED ORDER — MONTELUKAST SODIUM 10 MG PO TABS
10.0000 mg | ORAL_TABLET | Freq: Every day | ORAL | Status: DC
  Administered 2014-08-08 – 2014-08-11 (×4): 10 mg via ORAL
  Filled 2014-08-08 (×5): qty 1

## 2014-08-08 MED ORDER — INSULIN ASPART 100 UNIT/ML ~~LOC~~ SOLN
SUBCUTANEOUS | Status: AC
  Filled 2014-08-08: qty 1

## 2014-08-08 MED ORDER — AEROCHAMBER PLUS W/MASK MISC
Status: AC
  Filled 2014-08-08: qty 1

## 2014-08-08 MED ORDER — LINACLOTIDE 145 MCG PO CAPS
145.0000 ug | ORAL_CAPSULE | Freq: Every morning | ORAL | Status: DC
  Administered 2014-08-09 – 2014-08-12 (×4): 145 ug via ORAL
  Filled 2014-08-08 (×4): qty 1

## 2014-08-08 MED ORDER — INSULIN ASPART 100 UNIT/ML ~~LOC~~ SOLN
4.0000 [IU] | Freq: Three times a day (TID) | SUBCUTANEOUS | Status: DC
  Administered 2014-08-08: 4 [IU] via SUBCUTANEOUS

## 2014-08-08 MED ORDER — PANTOPRAZOLE SODIUM 40 MG PO TBEC
40.0000 mg | DELAYED_RELEASE_TABLET | Freq: Every day | ORAL | Status: DC
  Administered 2014-08-08 – 2014-08-12 (×5): 40 mg via ORAL
  Filled 2014-08-08 (×4): qty 1

## 2014-08-08 MED ORDER — CLOBETASOL PROPIONATE 0.05 % EX CREA
1.0000 "application " | TOPICAL_CREAM | Freq: Two times a day (BID) | CUTANEOUS | Status: DC
  Administered 2014-08-08 – 2014-08-12 (×8): 1 via TOPICAL
  Filled 2014-08-08: qty 15

## 2014-08-08 MED ORDER — LIDOCAINE 5 % EX OINT: 1.0000 "application " | TOPICAL_OINTMENT | CUTANEOUS | Status: DC | PRN

## 2014-08-08 MED ORDER — ALBUTEROL SULFATE HFA 108 (90 BASE) MCG/ACT IN AERS: 2.0000 | INHALATION_SPRAY | Freq: Once | RESPIRATORY_TRACT | Status: DC

## 2014-08-08 MED ORDER — ENOXAPARIN SODIUM 40 MG/0.4ML ~~LOC~~ SOLN
40.0000 mg | SUBCUTANEOUS | Status: DC
  Administered 2014-08-08 – 2014-08-11 (×4): 40 mg via SUBCUTANEOUS
  Filled 2014-08-08 (×5): qty 0.4

## 2014-08-08 MED ORDER — INSULIN ASPART 100 UNIT/ML ~~LOC~~ SOLN
0.0000 [IU] | Freq: Three times a day (TID) | SUBCUTANEOUS | Status: DC
  Administered 2014-08-08 – 2014-08-10 (×4): 3 [IU] via SUBCUTANEOUS
  Administered 2014-08-10: 5 [IU] via SUBCUTANEOUS
  Administered 2014-08-10: 3 [IU] via SUBCUTANEOUS
  Administered 2014-08-11: 5 [IU] via SUBCUTANEOUS
  Administered 2014-08-12: 3 [IU] via SUBCUTANEOUS

## 2014-08-08 MED ORDER — DEXTROSE 50 % IV SOLN
25.0000 mL | INTRAVENOUS | Status: DC | PRN
  Administered 2014-08-09 (×2): 25 mL via INTRAVENOUS

## 2014-08-08 MED ORDER — ACETAMINOPHEN 650 MG RE SUPP: 650.0000 mg | Freq: Four times a day (QID) | RECTAL | Status: DC | PRN

## 2014-08-08 MED ORDER — SODIUM CHLORIDE 0.9 % IJ SOLN
3.0000 mL | Freq: Two times a day (BID) | INTRAMUSCULAR | Status: DC
  Administered 2014-08-08 – 2014-08-12 (×8): 3 mL via INTRAVENOUS

## 2014-08-08 MED ORDER — POTASSIUM CHLORIDE CRYS ER 20 MEQ PO TBCR
20.0000 meq | EXTENDED_RELEASE_TABLET | Freq: Every morning | ORAL | Status: DC
  Administered 2014-08-09 – 2014-08-12 (×4): 20 meq via ORAL
  Filled 2014-08-08 (×6): qty 1

## 2014-08-08 NOTE — ED Provider Notes (Addendum)
CSN: [id number]     Arrival date & time 08/08/14  [id number] History   First MD Initiated Contact with Patient 08/08/14 [phone]     Chief Complaint  Patient presents with  . URI   (Consider location/radiation/quality/duration/timing/severity/associated sxs/prior Treatment) HPI Started 3 days ago w/ coughing and wheezing. Getting worse. Symptoms are constant.  Albuterol inhaler initially w/ benefit but no longer working. Denies shortness of breath, palpitations, fever, nausea, vomiting, diarrhea, back pain, dysuria, frequency.  Not checking home CBG. Patient with history of out-of-control diabetes as patient's blindness has been caused by her diabetes. Last A1c in 2014 of 11+.  After checking chem 8 patient endorses progressive extremity weakness and numbness as well as multiple bouts of emesis and feeling very ill over the last several days. Also with headaches. She endorses once weekly Trulicity and NovoLog 40 daily at bedtime.   Past Medical History  Diagnosis Date  . Asthma   . Diabetes mellitus   . Hypertension   . Blind   . Hypothyroidism   . Hyperlipidemia   . Diabetic neuropathy   . Blindness and low vision     left eye glass eye,  legally blind in right eye  . Spinal stenosis    Past Surgical History  Procedure Laterality Date  . Cesarean section      x 2  . Abdominal hysterectomy    . Cholecystectomy    . Enucleation     Family History  Problem Relation Age of Onset  . Diabetes Sister    History  Substance Use Topics  . Smoking status: Former Smoker    Quit date: 05/22/1992  . Smokeless tobacco: Never Used  . Alcohol Use: No   OB History    Obstetric Comments   Pt has two sons.     Review of Systems Precautions given and all questions answered  Allergies  Codeine; Iohexol; Penicillins; and Sulfa antibiotics  Home Medications   Prior to Admission medications   Medication Sig Start Date End Date Taking? Authorizing Provider  albuterol (PROVENTIL  HFA;VENTOLIN HFA) 108 (90 BASE) MCG/ACT inhaler Inhale 2 puffs into the lungs every 4 (four) hours as needed. For shortness of breath    Historical Provider, MD  BUTRANS 10 MCG/HR PTWK patch Place 1 patch onto the skin every 7 (seven) days. 01/15/14   Historical Provider, MD  clobetasol cream (TEMOVATE) [id number] % Apply 1 application topically 2 (two) times daily.    Historical Provider, MD  Dulaglutide (TRULICITY [place]) Inject 1.7 mLs into the skin once a week. Takes on Wednesday    Historical Provider, MD  esomeprazole (NEXIUM) 40 MG capsule Take 40 mg by mouth every morning.     Historical Provider, MD  furosemide (LASIX) 20 MG tablet Take 20 mg by mouth at bedtime.    Historical Provider, MD  HUMALOG KWIKPEN 100 UNIT/ML KiwkPen Inject 5 Units into the skin daily before supper. 12/26/13   Historical Provider, MD  insulin detemir (LEVEMIR) 100 unit/ml SOLN Inject 40 Units into the skin at bedtime.    Historical Provider, MD  levothyroxine (SYNTHROID, LEVOTHROID) 137 MCG tablet Take 137 mcg by mouth daily before breakfast.    Historical Provider, MD  lidocaine (XYLOCAINE) 5 % ointment Apply 1 application topically as needed for mild pain.  12/26/13   Historical Provider, MD  Linaclotide Rolan Lipa) 145 MCG CAPS capsule Take 145 mcg by mouth every morning.    Historical Provider, MD  midodrine (PROAMATINE) 5 MG tablet Take 5 mg by  mouth 3 (three) times daily with meals.     Historical Provider, MD  montelukast (SINGULAIR) 10 MG tablet Take 10 mg by mouth at bedtime.    Historical Provider, MD  polyethylene glycol powder (GLYCOLAX/MIRALAX) powder Take 17 g by mouth daily. 12/16/13   Historical Provider, MD  potassium chloride SA (K-DUR,KLOR-CON) 20 MEQ tablet Take 20 mEq by mouth every morning.    Historical Provider, MD  pregabalin (LYRICA) 100 MG capsule Take 100 mg by mouth 3 (three) times daily.    Historical Provider, MD  simvastatin (ZOCOR) 20 MG tablet Take 20 mg by mouth at bedtime.     Historical Provider,  MD   BP 117/70 mmHg  Pulse 82  Temp(Src) 98.7 F (37.1 C) (Oral)  Resp 22  SpO2 96% Physical Exam Physical Exam  Constitutional: Ill-appearing well-nourished. No distress.  HENT:  Head: Normocephalic and atraumatic.  Eyes: Patient blind with right eye exotropia Neck: Normal range of motion.  Cardiovascular: RRR, no m/r/g, 2+ distal pulses,  Pulmonary/Chest:  Diffuse wheezing throughout. Barky cough. Tachypnea. No rhonchi or crackles..  Abdominal: Soft. Bowel sounds are normal. NonTTP, no distension.  Musculoskeletal: Normal range of motion. Non ttp, no effusion.  Neurological: alert and oriented to person, place, and time.  Skin: Skin is warm. No rash noted. non diaphoretic.  Psychiatric: normal mood and affect. behavior is normal. Judgment and thought content normal.   ED Course  Procedures (including critical care time) Labs Review Labs Reviewed  GLUCOSE, CAPILLARY - Abnormal; Notable for the following:    Glucose-Capillary 534 (*)    All other components within normal limits  POCT I-STAT, CHEM 8 - Abnormal; Notable for the following:    Creatinine, Ser 1.20 (*)    Glucose, Bld 632 (*)    All other components within normal limits  COMPREHENSIVE METABOLIC PANEL  CBC WITH DIFFERENTIAL/PLATELET    Imaging Review No results found.   MDM   1. Poorly controlled diabetes mellitus   2. Hyperglycemia   3. Asthma exacerbation   4. Tachypnea   5. Nausea and vomiting, vomiting of unspecified type   6. AKI (acute kidney injury)      Poorly controlled diabetic presenting with asthma exacerbation with possible underlying croup given barky cough and out-of-control diabetes. Concern for possible early DKA. Patient currently With O2 sats at mid 90s. Chem-8 reviewed. Patient sugar significantly out of control and has a history of very poorly controlled diabetes with significant health problems as a result. Given patient's current complaints we will send her to the emergency room  for further workup. At this time will administer 1 L normal saline bolus, 10 units of NovoLog, DuoNeb. We'll hold off on steroids until she can be better evaluated with possible ABG. If patient significantly improved with DuoNeb only she may be able to avoid the steroids as this will definitely elevate her sugar and may necessitate an admission. Hopefully fluids, observation, and NovoLog will be enough to improve patient for discharge. CBC, CMP sent. Patient to be transported to ED via EMS.  Linna Darner, MD Family Medicine 08/08/2014, 9:44 AM    Waldemar Dickens, MD 08/08/14 [place], MD 08/08/14 [place], MD 08/08/14 [phone]

## 2014-08-08 NOTE — ED Notes (Signed)
Report to EMS.

## 2014-08-08 NOTE — ED Provider Notes (Signed)
CSN: [id number]     Arrival date & time 08/08/14  1047 History   First MD Initiated Contact with Patient 08/08/14 1049     Chief Complaint  Patient presents with  . Hyperglycemia      HPI Pt was seen at 1050. Per previous chart and pt: c/o gradual onset and persistence of constant cough and "wheezing" for the past 3 days. Pt has been using her MDI without relief. Pt also c/o multiple intermittent episodes of N/V/D for the past 2 days. Pt also states her glucometer "broke" several weeks ago and she has been unable to check her CBG at home. Pt was evaluated at [facility], CBG noted to be "632." Pt was given duoneb (for Sats in mid-90's), IVF NS 1L, and SQ insulin. Denies CP/palpitations, no abd pain, no back pain, no fevers, no black or blood in stools or emesis.    Past Medical History  Diagnosis Date  . Asthma   . Diabetes mellitus   . Hypertension   . Blind   . Hypothyroidism   . Hyperlipidemia   . Diabetic neuropathy   . Blindness and low vision     left eye glass eye,  legally blind in right eye  . Spinal stenosis    Past Surgical History  Procedure Laterality Date  . Cesarean section      x 2  . Abdominal hysterectomy    . Cholecystectomy    . Enucleation     Family History  Problem Relation Age of Onset  . Diabetes Sister    History  Substance Use Topics  . Smoking status: Former Smoker    Quit date: 05/22/1992  . Smokeless tobacco: Never Used  . Alcohol Use: No   OB History    Obstetric Comments   Pt has two sons.     Review of Systems ROS: Statement: All systems negative except as marked or noted in the HPI; Constitutional: Negative for fever and chills. ; ; Eyes: Negative for eye pain, redness and discharge. ; ; ENMT: Negative for ear pain, hoarseness, nasal congestion, sinus pressure and sore throat. ; ; Cardiovascular: Negative for chest pain, palpitations, diaphoresis, dyspnea and peripheral edema. ; ; Respiratory: +cough, wheezing. Negative for stridor. ; ;  Gastrointestinal: +N/V/D. Negative for abdominal pain, blood in stool, hematemesis, jaundice and rectal bleeding. . ; ; Genitourinary: Negative for dysuria, flank pain and hematuria. ; ; Musculoskeletal: Negative for back pain and neck pain. Negative for swelling and trauma.; ; Skin: Negative for pruritus, rash, abrasions, blisters, bruising and skin lesion.; ; Neuro: Negative for headache, lightheadedness and neck stiffness. Negative for weakness, altered level of consciousness , altered mental status, extremity weakness, paresthesias, involuntary movement, seizure and syncope.      Allergies  Codeine; Iohexol; Penicillins; and Sulfa antibiotics  Home Medications   Prior to Admission medications   Medication Sig Start Date End Date Taking? Authorizing Provider  albuterol (PROVENTIL HFA;VENTOLIN HFA) 108 (90 BASE) MCG/ACT inhaler Inhale 2 puffs into the lungs every 4 (four) hours as needed. For shortness of breath   Yes Historical Provider, MD  clobetasol cream (TEMOVATE) [id number] % Apply 1 application topically 2 (two) times daily.   Yes Historical Provider, MD  Diclofenac Sodium 1.5 % SOLN Place 1 each onto the skin daily as needed (pain).   Yes Historical Provider, MD  Dulaglutide (TRULICITY [place]) Inject 1.7 mLs into the skin once a week. Takes on Wednesday   Yes Historical Provider, MD  empagliflozin (JARDIANCE) 10  MG TABS tablet Take 10 mg by mouth daily.   Yes Historical Provider, MD  esomeprazole (NEXIUM) 40 MG capsule Take 40 mg by mouth every morning.    Yes Historical Provider, MD  furosemide (LASIX) 20 MG tablet Take 20 mg by mouth at bedtime.   Yes Historical Provider, MD  HUMALOG KWIKPEN 100 UNIT/ML KiwkPen Inject 5 Units into the skin daily before supper. 12/26/13  Yes Historical Provider, MD  insulin detemir (LEVEMIR) 100 unit/ml SOLN Inject 40 Units into the skin at bedtime.   Yes Historical Provider, MD  levothyroxine (SYNTHROID, LEVOTHROID) 137 MCG tablet Take 137 mcg by mouth daily  before breakfast.   Yes Historical Provider, MD  lidocaine (XYLOCAINE) 5 % ointment Apply 1 application topically as needed for mild pain.  12/26/13  Yes Historical Provider, MD  Linaclotide Rolan Lipa) 145 MCG CAPS capsule Take 145 mcg by mouth every morning.   Yes Historical Provider, MD  midodrine (PROAMATINE) 5 MG tablet Take 5 mg by mouth 3 (three) times daily with meals.    Yes Historical Provider, MD  montelukast (SINGULAIR) 10 MG tablet Take 10 mg by mouth at bedtime.   Yes Historical Provider, MD  naproxen sodium (ANAPROX) 220 MG tablet Take 220 mg by mouth 2 (two) times daily as needed (pain).   Yes Historical Provider, MD  polyethylene glycol powder (GLYCOLAX/MIRALAX) powder Take 17 g by mouth daily. 12/16/13  Yes Historical Provider, MD  potassium chloride SA (K-DUR,KLOR-CON) 20 MEQ tablet Take 20 mEq by mouth every morning.   Yes Historical Provider, MD  pregabalin (LYRICA) 100 MG capsule Take 100 mg by mouth 3 (three) times daily.   Yes Historical Provider, MD  simvastatin (ZOCOR) 20 MG tablet Take 20 mg by mouth at bedtime.    Yes Historical Provider, MD   BP 117/67 mmHg  Pulse 75  Resp 20  Ht 4\' 11"  (1.499 m)  Wt 162 lb (73.483 kg)  BMI 32.70 kg/m2  SpO2 100% Physical Exam  1055: Physical examination:  Nursing notes reviewed; Vital signs and O2 SAT reviewed;  Constitutional: Well developed, Well nourished, Well hydrated, In no acute distress; Head:  Normocephalic, atraumatic; Eyes: EOMI, PERRL, No scleral icterus; ENMT: Mouth and pharynx normal, Mucous membranes moist; Neck: Supple, Full range of motion, No lymphadenopathy; Cardiovascular: Regular rate and rhythm, No gallop; Respiratory: Breath sounds coarse & equal bilaterally, No wheezes. +moist cough during exam. Speaking full sentences with ease, Normal respiratory effort/excursion; Chest: Nontender, Movement normal; Abdomen: Soft, Nontender, Nondistended, Normal bowel sounds; Genitourinary: No CVA tenderness; Extremities: Pulses  normal, No tenderness, No edema, No calf edema or asymmetry.; Neuro: AA&Ox3, +blind per hx, otherwise major CN grossly intact. No facial droop. Speech clear. No gross focal motor deficits in extremities.; Skin: Color normal, Warm, Dry.   ED Course  Procedures     EKG Interpretation   Date/Time:  Friday August 08 2014 11:03:58 EDT Ventricular Rate:  84 PR Interval:  125 QRS Duration: 76 QT Interval:  376 QTC Calculation: 444 R Axis:   32 Text Interpretation:  Sinus rhythm Anteroseptal infarct, age indeterminate  When compared with ECG of 11/02/2013 No significant change was found  Confirmed by [facility]  MD, Nunzio Cory [phone]) on 08/08/2014 11:13:13 AM      MDM  MDM Reviewed: previous chart, nursing note and vitals Reviewed previous: labs and ECG Interpretation: labs, ECG and x-ray Total time providing critical care: 30-74 minutes. This excludes time spent performing separately reportable procedures and services. Consults: admitting MD  CRITICAL CARE Performed by: Alfonzo Feller Total critical care time: 35 Critical care time was exclusive of separately billable procedures and treating other patients. Critical care was necessary to treat or prevent imminent or life-threatening deterioration. Critical care was time spent personally by me on the following activities: development of treatment plan with patient and/or surrogate as well as nursing, discussions with consultants, evaluation of patient's response to treatment, examination of patient, obtaining history from patient or surrogate, ordering and performing treatments and interventions, ordering and review of laboratory studies, ordering and review of radiographic studies, pulse oximetry and re-evaluation of patient's condition.    Results for orders placed or performed during the hospital encounter of 08/08/14  Comprehensive metabolic panel  Result Value Ref Range   Sodium 138 135 - 145 mmol/L   Potassium 3.7 3.5 - 5.1  mmol/L   Chloride 102 96 - 112 mmol/L   CO2 22 19 - 32 mmol/L   Glucose, Bld 501 (H) 70 - 99 mg/dL   BUN 20 6 - 23 mg/dL   Creatinine, Ser 1.18 (H) 0.50 - 1.10 mg/dL   Calcium 8.8 8.4 - 10.5 mg/dL   Total Protein 6.7 6.0 - 8.3 g/dL   Albumin 2.9 (L) 3.5 - 5.2 g/dL   AST 18 0 - 37 U/L   ALT 14 0 - 35 U/L   Alkaline Phosphatase 106 39 - 117 U/L   Total Bilirubin 0.5 0.3 - 1.2 mg/dL   GFR calc non Af Amer 51 (L) >90 mL/min   GFR calc Af Amer 59 (L) >90 mL/min   Anion gap 14 5 - 15  CBC with Differential  Result Value Ref Range   WBC 7.1 4.0 - 10.5 K/uL   RBC 4.91 3.87 - 5.11 MIL/uL   Hemoglobin 13.1 12.0 - 15.0 g/dL   HCT 38.6 36.0 - 46.0 %   MCV 78.6 78.0 - 100.0 fL   MCH 26.7 26.0 - 34.0 pg   MCHC 33.9 30.0 - 36.0 g/dL   RDW 14.7 11.5 - 15.5 %   Platelets 137 (L) 150 - 400 K/uL   Neutrophils Relative % 56 43 - 77 %   Lymphocytes Relative 26 12 - 46 %   Monocytes Relative 17 (H) 3 - 12 %   Eosinophils Relative 1 0 - 5 %   Basophils Relative 0 0 - 1 %   Neutro Abs 4.0 1.7 - 7.7 K/uL   Lymphs Abs 1.8 0.7 - 4.0 K/uL   Monocytes Absolute 1.2 (H) 0.1 - 1.0 K/uL   Eosinophils Absolute 0.1 0.0 - 0.7 K/uL   Basophils Absolute 0.0 0.0 - 0.1 K/uL   Smear Review MORPHOLOGY UNREMARKABLE   Urinalysis, Routine w reflex microscopic  Result Value Ref Range   Color, Urine YELLOW YELLOW   APPearance CLEAR CLEAR   Specific Gravity, Urine 1.026 1.005 - 1.030   pH 5.5 5.0 - 8.0   Glucose, UA >1000 (A) NEGATIVE mg/dL   Hgb urine dipstick TRACE (A) NEGATIVE   Bilirubin Urine NEGATIVE NEGATIVE   Ketones, ur NEGATIVE NEGATIVE mg/dL   Protein, ur NEGATIVE NEGATIVE mg/dL   Urobilinogen, UA 0.2 0.0 - 1.0 mg/dL   Nitrite NEGATIVE NEGATIVE   Leukocytes, UA NEGATIVE NEGATIVE  Troponin I  Result Value Ref Range   Troponin I 0.04 (H) <0.031 ng/mL  Urine microscopic-add on  Result Value Ref Range   Squamous Epithelial / LPF RARE RARE   WBC, UA 0-2 <3 WBC/hpf   RBC / HPF 0-2 <3  RBC/hpf    Urine-Other RARE YEAST   CBG monitoring, ED  Result Value Ref Range   Glucose-Capillary 401 (H) 70 - 99 mg/dL  I-Stat arterial blood gas, ED  Result Value Ref Range   pH, Arterial 7.502 (H) 7.350 - 7.450   pCO2 arterial 25.5 (L) 35.0 - 45.0 mmHg   pO2, Arterial 78.0 (L) 80.0 - 100.0 mmHg   Bicarbonate 20.0 20.0 - 24.0 mEq/L   TCO2 21 0 - 100 mmol/L   O2 Saturation 97.0 %   Acid-base deficit 2.0 0.0 - 2.0 mmol/L   Collection site FEMORAL ARTERY    Sample type ARTERIAL   CBG monitoring, ED  Result Value Ref Range   Glucose-Capillary 294 (H) 70 - 99 mg/dL   Dg Chest 2 View 08/08/2014   CLINICAL DATA:  Cough and congestion .  EXAM: CHEST  2 VIEW  COMPARISON:  None.  FINDINGS: Mediastinum and hilar structures normal. Low lung volumes with mild basilar atelectasis and/or infiltrate. No pleural effusion or pneumothorax. Heart size normal.  IMPRESSION: Mild bibasilar atelectasis and/or infiltrates.   Electronically Signed   By: Marcello Moores  Register   On: 08/08/2014 13:13   Results for ANGLE, DOTO (MRN [name]) as of 08/08/2014 14:03  Ref. Range 04/19/2014 10:24 08/08/2014 09:25 08/08/2014 09:45 08/08/2014 11:20  BUN Latest Range: 6-23 mg/dL 18 22 20 20   Creatinine Latest Range: 0.50-1.10 mg/dL 0.74 1.20 (H) 1.37 (H) 1.18 (H)    1345:  No N/V or stooling while in the ED. Lungs remain coarse without wheezes, +moist cough, after neb treatment. CBG elevated but pt is not acidotic. Cr elevated from baseline. IVF NS 2nd L given, and IV insulin gtt started. CBG slowly improving. Troponin mildly elevated, but EKG without acute STTW changes and pt denies CP/SOB. Dx and testing d/w pt and family.  Questions answered.  Verb understanding, agreeable to admit.   T/C to [facility]. Algis Liming, case discussed, including:  HPI, pertinent PM/SHx, VS/PE, dx testing, ED course and treatment:  Agreeable to admit, requests to write temporary orders, obtain inpt tele bed to team MCAdmits.   Francine Graven, DO 08/11/14  0109

## 2014-08-08 NOTE — ED Notes (Signed)
CBG = 401  RN  Rodman Key informed of result.

## 2014-08-08 NOTE — ED Notes (Signed)
Pt in from [facility] via [facility], pt seen at [facility] for non productive cough onset x 3 days, pts glucose 632 at UC, pt takes Levimir, Humalog, & Trulicity for DM, pt reports n/v onset yesterday x 3 episodes, pt reports x 4 liquid stools in 24 hrs, Pt A&O x4, follows commands, speaks in complete sentences, pt blind

## 2014-08-08 NOTE — H&P (Signed)
History and Physical  Amy [name] [id number] DOB: 11-13-1960 DOA: 08/08/2014  Referring physician: Dr. Francine Graven, EDP PCP: Velna Hatchet, MD  Outpatient Specialists:  1. Not known  Chief Complaint: Cough, dyspnea on mild wheezing, nausea, vomiting and diarrhea.  HPI: Amy [name] is a 54 y.o. female with history of type II DM/IDDM, HTN, blindness in the right eye, false left eye, hypothyroid, HLD, asthma, sent from urgent care center with above complaints. Patient gives 3-4 day history of nonproductive cough, anterior chest pain only on coughing, intermittent dyspnea but denies fever or chills. She has been using her home inhalers without significant relief. She also gives 2 days history of 3-4 episodes of nonbloody emesis and diarrhea it out abdominal pain. Appetite has decreased. States that diarrhea and vomiting have resolved since yesterday. She does not check her home blood sugars because of nonfunctioning glucometer. She claims compliance with her diabetic medications but not to the diet. At the urgent care center, her CBG was noted to be 632 mg per DL. She was sent to the ED where she was hydrated with IV fluids and started on IV insulin by glucose stabilizer and her CBGs have gradually dropped to 205 mg per DL. Patient denies any other complaints. In the ED, glucose 501, creatinine 1.18, albumin 2.9 and platelets 137 with troponin of 0.04. Patient denies ischemic type of CP.   Review of Systems: All systems reviewed and apart from history of presenting illness, are negative.  Past Medical History  Diagnosis Date  . Asthma   . Diabetes mellitus   . Hypertension   . Blind   . Hypothyroidism   . Hyperlipidemia   . Diabetic neuropathy   . Blindness and low vision     left eye glass eye,  legally blind in right eye  . Spinal stenosis    Past Surgical History  Procedure Laterality Date  . Cesarean section      x 2  . Abdominal hysterectomy    .  Cholecystectomy    . Enucleation     Social History:  reports that she quit smoking about 22 years ago. She has never used smokeless tobacco. She reports that she does not drink alcohol or use illicit drugs. Lives alone. Ambulates with the help of a cane.  Allergies  Allergen Reactions  . Codeine Nausea Only  . Iohexol      Code: HIVES, Desc: pt developed itching and hives along with nasal congestion; needs 13 hour premeds for future studies, Onset Date: [id number]   . Penicillins Hives  . Sulfa Antibiotics Nausea And Vomiting    Family History  Problem Relation Age of Onset  . Diabetes Sister     Prior to Admission medications   Medication Sig Start Date End Date Taking? Authorizing Provider  albuterol (PROVENTIL HFA;VENTOLIN HFA) 108 (90 BASE) MCG/ACT inhaler Inhale 2 puffs into the lungs every 4 (four) hours as needed. For shortness of breath   Yes Historical Provider, MD  clobetasol cream (TEMOVATE) [id number] % Apply 1 application topically 2 (two) times daily.   Yes Historical Provider, MD  Diclofenac Sodium 1.5 % SOLN Place 1 each onto the skin daily as needed (pain).   Yes Historical Provider, MD  Dulaglutide (TRULICITY [place]) Inject 1.7 mLs into the skin once a week. Takes on Wednesday   Yes Historical Provider, MD  empagliflozin (JARDIANCE) 10 MG TABS tablet Take 10 mg by mouth daily.   Yes Historical Provider, MD  esomeprazole ([place])  40 MG capsule Take 40 mg by mouth every morning.    Yes Historical Provider, MD  furosemide (LASIX) 20 MG tablet Take 20 mg by mouth at bedtime.   Yes Historical Provider, MD  HUMALOG KWIKPEN 100 UNIT/ML KiwkPen Inject 5 Units into the skin daily before supper. 12/26/13  Yes Historical Provider, MD  insulin detemir (LEVEMIR) 100 unit/ml SOLN Inject 40 Units into the skin at bedtime.   Yes Historical Provider, MD  levothyroxine (SYNTHROID, LEVOTHROID) 137 MCG tablet Take 137 mcg by mouth daily before breakfast.   Yes Historical Provider, MD  lidocaine  (XYLOCAINE) 5 % ointment Apply 1 application topically as needed for mild pain.  12/26/13  Yes Historical Provider, MD  Linaclotide Rolan Lipa) 145 MCG CAPS capsule Take 145 mcg by mouth every morning.   Yes Historical Provider, MD  midodrine (PROAMATINE) 5 MG tablet Take 5 mg by mouth 3 (three) times daily with meals.    Yes Historical Provider, MD  montelukast (SINGULAIR) 10 MG tablet Take 10 mg by mouth at bedtime.   Yes Historical Provider, MD  naproxen sodium (ANAPROX) 220 MG tablet Take 220 mg by mouth 2 (two) times daily as needed (pain).   Yes Historical Provider, MD  polyethylene glycol powder (GLYCOLAX/MIRALAX) powder Take 17 g by mouth daily. 12/16/13  Yes Historical Provider, MD  potassium chloride SA (K-DUR,KLOR-CON) 20 MEQ tablet Take 20 mEq by mouth every morning.   Yes Historical Provider, MD  pregabalin (LYRICA) 100 MG capsule Take 100 mg by mouth 3 (three) times daily.   Yes Historical Provider, MD  simvastatin (ZOCOR) 20 MG tablet Take 20 mg by mouth at bedtime.    Yes Historical Provider, MD   Physical Exam: Filed Vitals:   08/08/14 1300 08/08/14 1315 08/08/14 1330 08/08/14 1415  BP: 124/64 118/65 117/67 115/91  Pulse: 77 77 75 83  Resp: 18 23 20 14   Height:      Weight:      SpO2: 100% 100% 100% 100%   temperature: 98.40F.   General exam: Moderately built and nourished pleasant middle-aged female patient, lying comfortably supine on the gurney in no obvious distress.  Head, eyes and ENT: Nontraumatic and normocephalic. Pupils equally reacting to light and accommodation. Oral mucosa dry. Right eye vision limited to perception of light. False left eye.  Neck: Supple. No JVD, carotid bruit or thyromegaly.  Lymphatics: No lymphadenopathy.  Respiratory system: Clear to auscultation. No increased work of breathing.  Cardiovascular system: S1 and S2 heard, RRR. No JVD, murmurs, gallops, clicks or pedal edema.  Gastrointestinal system: Abdomen is nondistended, soft and  nontender. Normal bowel sounds heard. No organomegaly or masses appreciated.  Central nervous system: Alert and oriented. No focal neurological deficits.  Extremities: Symmetric 5 x 5 power. Peripheral pulses symmetrically felt.   Skin: No rashes or acute findings.  Musculoskeletal system: Negative exam.  Psychiatry: Pleasant and cooperative.   Labs on Admission:  Basic Metabolic Panel:  Recent Labs Lab 08/08/14 0925 08/08/14 0945 08/08/14 1120  NA 137 136 138  K 4.2 3.9 3.7  CL 99 99 102  CO2  --  29 22  GLUCOSE 632* 640* 501*  BUN 22 20 20   CREATININE 1.20* 1.37* 1.18*  CALCIUM  --  8.7 8.8   Liver Function Tests:  Recent Labs Lab 08/08/14 0945 08/08/14 1120  AST 14 18  ALT 14 14  ALKPHOS 112 106  BILITOT 0.6 0.5  PROT 6.8 6.7  ALBUMIN 3.0* 2.9*   No results  for input(s): LIPASE, AMYLASE in the last 168 hours. No results for input(s): AMMONIA in the last 168 hours. CBC:  Recent Labs Lab 08/08/14 0925 08/08/14 0945 08/08/14 1120  WBC  --  6.7 7.1  NEUTROABS  --  4.0 4.0  HGB 14.6 12.8 13.1  HCT 43.0 39.0 38.6  MCV  --  79.8 78.6  PLT  --  135* 137*   Cardiac Enzymes:  Recent Labs Lab 08/08/14 1120  TROPONINI 0.04*    BNP (last 3 results) No results for input(s): PROBNP in the last 8760 hours. CBG:  Recent Labs Lab 08/08/14 0917 08/08/14 1154 08/08/14 1322 08/08/14 1428  GLUCAP 534* 401* 294* 205*    Radiological Exams on Admission: Dg Chest 2 View  08/08/2014   CLINICAL DATA:  Cough and congestion .  EXAM: CHEST  2 VIEW  COMPARISON:  None.  FINDINGS: Mediastinum and hilar structures normal. Low lung volumes with mild basilar atelectasis and/or infiltrate. No pleural effusion or pneumothorax. Heart size normal.  IMPRESSION: Mild bibasilar atelectasis and/or infiltrates.   Electronically Signed   By: Marcello Moores  Register   On: 08/08/2014 13:13    EKG: Independently reviewed. Sinus rhythm, normal axis and no acute changes. Q waves in  leads V1-2.  Assessment/Plan Principal Problem:   DM (diabetes mellitus), type 2, uncontrolled, with hyperosmolarity - Probably multifactorial secondary to acute illness, poor compliance to diet and medications. - Admit to telemetry. Initially treated with IV insulin drip per glucose stabilizer and CBGs have improved to 205 mg per DL. - We'll transition to home dose Levemir and add NovoLog SSI and mealtime NovoLog. - DC insulin drip 2 hours after Levemir given. Monitor closely.  Active Problems:   Dehydration - Secondary to poor oral intake, GI losses and uncontrolled diabetes. - IV fluids.    Blindness - Perception of light and right eye. False left eye.    Hypothyroidism    Acute Viral Bronchitis - Treat supportively. - Placed on droplet isolation until a few panel PCR negative. - No clinical or radiological findings to suggest pneumonia. Hold off on antibiotics.    Elevated troponin - No reported chest pain or acute EKG changes. - Cycle troponins. Further workup if troponin start rising or she becomes symptomatic.    Nausea vomiting and diarrhea - Probably acute viral GE. Improved prior to hospital arrival. - Diet and treat supportively. Monitor.  Essential hypertension -Controlled     Code Status: Full  Family Communication: None at bedside  Disposition Plan: Home when medically stable   Time spent: 44 minutes  Praneel Haisley, MD, FACP, FHM. [facility] Pager [phone]  If 7PM-7AM, please contact night-coverage [email or web address] Password [facility] 08/08/2014, 3:13 PM

## 2014-08-08 NOTE — ED Notes (Signed)
Patient c/o cough with sob, chest pains, and generalized body aches x 3 days. Patient reports she is unsure if she had any fever. She is sitting upright on the exam table. Patient is in NAD.

## 2014-08-09 DIAGNOSIS — E162 Hypoglycemia, unspecified: Secondary | ICD-10-CM | POA: Diagnosis present

## 2014-08-09 DIAGNOSIS — H540 Blindness, both eyes: Secondary | ICD-10-CM

## 2014-08-09 LAB — CBC
HCT: 36.1 % (ref 36.0–46.0)
HEMOGLOBIN: 11.7 g/dL — AB (ref 12.0–15.0)
MCH: 26.1 pg (ref 26.0–34.0)
MCHC: 32.4 g/dL (ref 30.0–36.0)
MCV: 80.4 fL (ref 78.0–100.0)
PLATELETS: 135 10*3/uL — AB (ref 150–400)
RBC: 4.49 MIL/uL (ref 3.87–5.11)
RDW: 14.9 % (ref 11.5–15.5)
WBC: 6.3 10*3/uL (ref 4.0–10.5)

## 2014-08-09 LAB — GLUCOSE, CAPILLARY
GLUCOSE-CAPILLARY: 132 mg/dL — AB (ref 70–99)
GLUCOSE-CAPILLARY: 24 mg/dL — AB (ref 70–99)
GLUCOSE-CAPILLARY: 155 mg/dL — AB (ref 70–99)
Glucose-Capillary: 162 mg/dL — ABNORMAL HIGH (ref 70–99)
Glucose-Capillary: 160 mg/dL — ABNORMAL HIGH (ref 70–99)
Glucose-Capillary: 170 mg/dL — ABNORMAL HIGH (ref 70–99)

## 2014-08-09 LAB — INFLUENZA PANEL BY PCR (TYPE A & B)
H1N1 flu by pcr: DETECTED — AB
Influenza A By PCR: POSITIVE — AB
Influenza B By PCR: NEGATIVE

## 2014-08-09 LAB — BASIC METABOLIC PANEL
Anion gap: 4 — ABNORMAL LOW (ref 5–15)
BUN: 12 mg/dL (ref 6–23)
CO2: 26 mmol/L (ref 19–32)
CREATININE: 0.85 mg/dL (ref 0.50–1.10)
Calcium: 7.8 mg/dL — ABNORMAL LOW (ref 8.4–10.5)
Chloride: 114 mmol/L — ABNORMAL HIGH (ref 96–112)
GFR calc Af Amer: 88 mL/min — ABNORMAL LOW (ref >90)
GFR calc non Af Amer: 76 mL/min — ABNORMAL LOW (ref >90)
Glucose, Bld: 42 mg/dL — CL (ref 70–99)
POTASSIUM: 3.5 mmol/L (ref 3.5–5.1)
Sodium: 144 mmol/L (ref 135–145)

## 2014-08-09 LAB — TROPONIN I: Troponin I: <0.03 ng/mL (ref <0.031)

## 2014-08-09 MED ORDER — INSULIN DETEMIR 100 UNIT/ML ~~LOC~~ SOLN
20.0000 [IU] | Freq: Every day | SUBCUTANEOUS | Status: DC
  Administered 2014-08-09: 20 [IU] via SUBCUTANEOUS
  Filled 2014-08-09 (×2): qty 0.2

## 2014-08-09 MED ORDER — OSELTAMIVIR PHOSPHATE 75 MG PO CAPS
75.0000 mg | ORAL_CAPSULE | Freq: Two times a day (BID) | ORAL | Status: DC
  Administered 2014-08-09 – 2014-08-12 (×7): 75 mg via ORAL
  Filled 2014-08-09 (×9): qty 1

## 2014-08-09 MED ORDER — DEXTROSE 50 % IV SOLN
INTRAVENOUS | Status: AC
  Administered 2014-08-09: 25 mL via INTRAVENOUS
  Filled 2014-08-09: qty 50

## 2014-08-09 NOTE — Progress Notes (Signed)
Hypoglycemic Event  CBG: Results for Amy [name], Amy [name] (MRN [name]) as of 08/09/2014 05:09  Ref. Range 08/09/2014 03:25  Glucose Latest Range: 70-99 mg/dL 42 (LL)    Treatment: D50 IV 50 mL  Symptoms: Pale  Follow-up CBG: Time:0535 CBG Result: 132  Possible Reasons for Event:  Poor po  Comments/MD notified:    Viviano Simas  Remember to initiate Hypoglycemia Order Set & complete

## 2014-08-09 NOTE — Progress Notes (Signed)
Hypoglycemic Event  CBG: 24  Treatment: D50 IV 25 mL  Symptoms: Shaky  Follow-up CBG: [id number] CBG Result:155  Possible Reasons for Event: Unknown  Comments/MD notified:No    Tasheka Houseman, Thea Gist  Remember to initiate Hypoglycemia Order Set & complete

## 2014-08-09 NOTE — Progress Notes (Signed)
[facility] PROGRESS NOTE  Amy [name] [id number] DOB: [birth year] DOA: 08/08/2014 PCP: Velna Hatchet, MD  Assessment/Plan:  Principal Problem:   DM (diabetes mellitus), type 2, uncontrolled, with hyperosmolarity: now with hypoglycemia. Decrease insulin. monitor Active Problems:   Dehydration: continue IVF   Blindness: has an aide. Lives alone   Hypothyroidism   Bronchitis: await flu swab   Elevated troponin: trend flat. No CP. No evidence of ACS   Nausea vomiting and diarrhea: no further diarrhea. Concerned about influenza. D/c c diff pcr, precautions   Hypoglycemia  HPI/Subjective: Weak. Not eating. No vomiting or diarrhea. Cough and rhinorrhea/congestion.  Objective: Filed Vitals:   08/09/14 0809  BP: 100/52  Pulse: 98  Temp: 97.9 F (36.6 C)  Resp: 73    Intake/Output Summary (Last 24 hours) at 08/09/14 0835 Last data filed at 08/09/14 0600  Gross per 24 hour  Intake 1868.75 ml  Output    425 ml  Net 1443.75 ml   Filed Weights   08/08/14 1053 08/08/14 2030  Weight: 73.483 kg (162 lb) 76.658 kg (169 lb)    Exam:   General:  Ill appearing. oriented  Cardiovascular: RRR without MGR  Respiratory: cTA without WRR  Abdomen: S, NT, ND  Ext: no CCE  Basic Metabolic Panel:  Recent Labs Lab 08/08/14 0925 08/08/14 0945 08/08/14 1120 08/09/14 0325  NA 137 136 138 144  K 4.2 3.9 3.7 3.5  CL 99 99 102 114*  CO2  --  29 22 26   GLUCOSE 632* 640* 501* 42*  BUN 22 20 20 12   CREATININE 1.20* 1.37* 1.18* 0.85  CALCIUM  --  8.7 8.8 7.8*   Liver Function Tests:  Recent Labs Lab 08/08/14 0945 08/08/14 1120  AST 14 18  ALT 14 14  ALKPHOS 112 106  BILITOT 0.6 0.5  PROT 6.8 6.7  ALBUMIN 3.0* 2.9*   No results for input(s): LIPASE, AMYLASE in the last 168 hours. No results for input(s): AMMONIA in the last 168 hours. CBC:  Recent Labs Lab 08/08/14 0925 08/08/14 0945 08/08/14 1120 08/09/14 0325  WBC  --  6.7 7.1 6.3  NEUTROABS   --  4.0 4.0  --   HGB 14.6 12.8 13.1 11.7*  HCT 43.0 39.0 38.6 36.1  MCV  --  79.8 78.6 80.4  PLT  --  135* 137* 135*   Cardiac Enzymes:  Recent Labs Lab 08/08/14 1120 08/08/14 1650 08/08/14 2127 08/09/14 0325  TROPONINI 0.04* <0.03 <0.03 <0.03   BNP (last 3 results) No results for input(s): BNP in the last 8760 hours.  ProBNP (last 3 results) No results for input(s): PROBNP in the last 8760 hours.  CBG:  Recent Labs Lab 08/08/14 1428 08/08/14 1600 08/08/14 1728 08/08/14 2025 08/09/14 0807  GLUCAP 205* 184* 171* 134* 155*    No results found for this or any previous visit (from the past 240 hour(s)).   Studies: Dg Chest 2 View  08/08/2014   CLINICAL DATA:  Cough and congestion .  EXAM: CHEST  2 VIEW  COMPARISON:  None.  FINDINGS: Mediastinum and hilar structures normal. Low lung volumes with mild basilar atelectasis and/or infiltrate. No pleural effusion or pneumothorax. Heart size normal.  IMPRESSION: Mild bibasilar atelectasis and/or infiltrates.   Electronically Signed   By: Marcello Moores  Register   On: 08/08/2014 13:13    Scheduled Meds: . clobetasol cream  1 application Topical BID  . enoxaparin (LOVENOX) injection  40 mg Subcutaneous Q24H  . guaiFENesin  600 mg  Oral BID  . insulin aspart  0-15 Units Subcutaneous TID WC  . insulin aspart  0-5 Units Subcutaneous QHS  . insulin aspart  4 Units Subcutaneous TID WC  . insulin detemir  40 Units Subcutaneous QHS  . levothyroxine  137 mcg Oral QAC breakfast  . Linaclotide  145 mcg Oral q morning - 10a  . midodrine  5 mg Oral TID WC  . montelukast  10 mg Oral QHS  . pantoprazole  40 mg Oral Daily  . potassium chloride SA  20 mEq Oral q morning - 10a  . pregabalin  100 mg Oral TID  . simvastatin  20 mg Oral QHS  . sodium chloride  3 mL Intravenous Q12H   Continuous Infusions: . 0.9 % NaCl with KCl 20 mEq / L 100 mL/hr at 08/09/14 0533    Time spent: 35 minutes  [facility]   [email or web address], password [facility] 08/09/2014, 8:35 AM  LOS: 1 day

## 2014-08-10 DIAGNOSIS — E1165 Type 2 diabetes mellitus with hyperglycemia: Principal | ICD-10-CM

## 2014-08-10 LAB — GLUCOSE, CAPILLARY
GLUCOSE-CAPILLARY: 183 mg/dL — AB (ref 70–99)
GLUCOSE-CAPILLARY: 178 mg/dL — AB (ref 70–99)
GLUCOSE-CAPILLARY: 186 mg/dL — AB (ref 70–99)
GLUCOSE-CAPILLARY: 232 mg/dL — AB (ref 70–99)
GLUCOSE-CAPILLARY: 220 mg/dL — AB (ref 70–99)
Glucose-Capillary: 167 mg/dL — ABNORMAL HIGH (ref 70–99)

## 2014-08-10 MED ORDER — ALBUTEROL SULFATE (2.5 MG/3ML) 0.083% IN NEBU
2.5000 mg | INHALATION_SOLUTION | Freq: Once | RESPIRATORY_TRACT | Status: AC
  Administered 2014-08-10: 2.5 mg via RESPIRATORY_TRACT
  Filled 2014-08-10: qty 3

## 2014-08-10 MED ORDER — INSULIN DETEMIR 100 UNIT/ML ~~LOC~~ SOLN
30.0000 [IU] | Freq: Every day | SUBCUTANEOUS | Status: DC
  Administered 2014-08-10 – 2014-08-11 (×2): 30 [IU] via SUBCUTANEOUS
  Filled 2014-08-10 (×3): qty 0.3

## 2014-08-10 NOTE — Progress Notes (Addendum)
[facility] PROGRESS NOTE  Amy [name] [id number] DOB: Mar 16, 1961 DOA: 08/08/2014 PCP: Velna Hatchet, MD  Assessment/Plan:  Principal Problem:   DM (diabetes mellitus), type 2, uncontrolled, cbgs creeping back up. Increase levemir to 30 Active Problems:   Dehydration: continue IVF   Blindness: has an aide. Lives alone   Hypothyroidism Influenza a h1n1. Started tamiflu. PT eval pending   Elevated troponin: trend flat. No CP. No evidence of ACS   Nausea vomiting and diarrhea: resolved   Hypoglycemia: none further  HPI/Subjective: Still feels poorly.  Coughing. Feels congested and wheezy. Requesting nebulizer.  Objective: Filed Vitals:   08/10/14 0825  BP: 115/68  Pulse: 84  Temp: 100.6 F (38.1 C)  Resp: 17    Intake/Output Summary (Last 24 hours) at 08/10/14 1206 Last data filed at 08/10/14 1000  Gross per 24 hour  Intake    360 ml  Output   2001 ml  Net  -1641 ml   Filed Weights   08/08/14 1053 08/08/14 2030 08/09/14 2031  Weight: 73.483 kg (162 lb) 76.658 kg (169 lb) 80.287 kg (177 lb)    Exam:   General:  In chair. Coughing.  Cardiovascular: RRR without MGR  Respiratory: rhonchi. No wheeze or rales  Abdomen: S, NT, ND  Ext: no CCE  Basic Metabolic Panel:  Recent Labs Lab 08/08/14 0925 08/08/14 0945 08/08/14 1120 08/09/14 0325  NA 137 136 138 144  K 4.2 3.9 3.7 3.5  CL 99 99 102 114*  CO2  --  29 22 26   GLUCOSE 632* 640* 501* 42*  BUN 22 20 20 12   CREATININE 1.20* 1.37* 1.18* 0.85  CALCIUM  --  8.7 8.8 7.8*   Liver Function Tests:  Recent Labs Lab 08/08/14 0945 08/08/14 1120  AST 14 18  ALT 14 14  ALKPHOS 112 106  BILITOT 0.6 0.5  PROT 6.8 6.7  ALBUMIN 3.0* 2.9*   No results for input(s): LIPASE, AMYLASE in the last 168 hours. No results for input(s): AMMONIA in the last 168 hours. CBC:  Recent Labs Lab 08/08/14 0925 08/08/14 0945 08/08/14 1120 08/09/14 0325  WBC  --  6.7 7.1 6.3  NEUTROABS  --  4.0 4.0   --   HGB 14.6 12.8 13.1 11.7*  HCT 43.0 39.0 38.6 36.1  MCV  --  79.8 78.6 80.4  PLT  --  135* 137* 135*   Cardiac Enzymes:  Recent Labs Lab 08/08/14 1120 08/08/14 1650 08/08/14 2127 08/09/14 0325  TROPONINI 0.04* <0.03 <0.03 <0.03   BNP (last 3 results) No results for input(s): BNP in the last 8760 hours.  ProBNP (last 3 results) No results for input(s): PROBNP in the last 8760 hours.  CBG:  Recent Labs Lab 08/09/14 2025 08/10/14 0508 08/10/14 0547 08/10/14 0725 08/10/14 1120  GLUCAP 170* 183* 178* 186* 232*    No results found for this or any previous visit (from the past 240 hour(s)).   Studies: Dg Chest 2 View  08/08/2014   CLINICAL DATA:  Cough and congestion .  EXAM: CHEST  2 VIEW  COMPARISON:  None.  FINDINGS: Mediastinum and hilar structures normal. Low lung volumes with mild basilar atelectasis and/or infiltrate. No pleural effusion or pneumothorax. Heart size normal.  IMPRESSION: Mild bibasilar atelectasis and/or infiltrates.   Electronically Signed   By: [place]   On: 08/08/2014 13:13    Scheduled Meds: . albuterol  2.5 mg Nebulization Once  . clobetasol cream  1 application Topical BID  .  enoxaparin (LOVENOX) injection  40 mg Subcutaneous Q24H  . guaiFENesin  600 mg Oral BID  . insulin aspart  0-15 Units Subcutaneous TID WC  . insulin aspart  0-5 Units Subcutaneous QHS  . insulin detemir  30 Units Subcutaneous QHS  . levothyroxine  137 mcg Oral QAC breakfast  . Linaclotide  145 mcg Oral q morning - 10a  . midodrine  5 mg Oral TID WC  . montelukast  10 mg Oral QHS  . oseltamivir  75 mg Oral BID  . pantoprazole  40 mg Oral Daily  . potassium chloride SA  20 mEq Oral q morning - 10a  . pregabalin  100 mg Oral TID  . simvastatin  20 mg Oral QHS  . sodium chloride  3 mL Intravenous Q12H   Continuous Infusions:    Time spent: 25 minutes  [facility]  [email or web address], password [facility] 08/10/2014, 12:06 PM  LOS: 2  days

## 2014-08-10 NOTE — Evaluation (Signed)
Physical Therapy Evaluation Patient Details Name: Amy [name] MRN: [id number] DOB: Oct 08, 1960 Today's Date: 08/10/2014   History of Present Illness  Patient is a 54 yo female admitted 08/08/14 with uncontrolled DM, URI, nausea/vomiting.  PMH:  Legally blind, HTN, DM, neuropathy, hypothyroid, asthma, HLD, back and neck pain  Clinical Impression  Patient presents with problems listed below.  Will benefit from acute PT to maximize functional independence prior to discharge home.    Follow Up Recommendations No PT follow up;Supervision - Intermittent (Possibly return to OP PT for back pain. F/u at pain clinic)    Equipment Recommendations  None recommended by PT    Recommendations for Other Services       Precautions / Restrictions Precautions Precautions: Fall Precaution Comments: unfamiliar surroundings Restrictions Weight Bearing Restrictions: No      Mobility  Bed Mobility Overal bed mobility: Modified Independent             General bed mobility comments: Increased time  Transfers Overall transfer level: Modified independent Equipment used: None             General transfer comment: Increased time to move to standing.  Holding on to bed rail.  Ambulation/Gait Ambulation/Gait assistance: Min assist Ambulation Distance (Feet): 32 Feet Assistive device: 1 person hand held assist Gait Pattern/deviations: Step-through pattern;Decreased stride length Gait velocity: Decreased Gait velocity interpretation: Below normal speed for age/gender General Gait Details: Assist and verbal cues for directions to avoid obstacles in room.  Good balance with ambulation.  Slight loss of balance during turns - able to self correct.  Stairs            Wheelchair Mobility    Modified Rankin (Stroke Patients Only)       Balance                                             Pertinent Vitals/Pain Pain Assessment: 0-10 Pain Score: 10-Worst pain  ever Pain Location: upper back and neck Pain Descriptors / Indicators: Aching;Constant (chronic) Pain Intervention(s): Monitored during session;Repositioned;Patient requesting pain meds-RN notified    Home Living Family/patient expects to be discharged to:: Private residence Living Arrangements: Alone Available Help at Discharge: Family;Friend(s);Available PRN/intermittently Type of Home: Apartment Home Access: Level entry     Home Layout: One level Home Equipment: Walker - 2 wheels (assist cane)      Prior Function Level of Independence: Independent with assistive device(s);Needs assistance   Gait / Transfers Assistance Needed: Does not use cane in apartment.  Uses it outside of apt.     Comments: Works at [employer] for [employer].  Reports difficulty going down the steps there.     Hand Dominance        Extremity/Trunk Assessment   Upper Extremity Assessment: Generalized weakness (Pain impacting mobility Lt shoulder)           Lower Extremity Assessment: Generalized weakness         Communication   Communication: No difficulties  Cognition Arousal/Alertness: Awake/alert Behavior During Therapy: WFL for tasks assessed/performed Overall Cognitive Status: Within Functional Limits for tasks assessed                      General Comments      Exercises        Assessment/Plan    PT Assessment Patient needs continued PT services  PT Diagnosis Abnormality of gait;Generalized weakness;Acute pain   PT Problem List Decreased strength;Decreased activity tolerance;Decreased balance;Decreased mobility;Pain  PT Treatment Interventions Gait training;Stair training;Functional mobility training;Therapeutic activities;Balance training;Patient/family education   PT Goals (Current goals can be found in the Care Plan section) Acute Rehab PT Goals Patient Stated Goal: To manage steps at [facility] PT Goal Formulation: With patient Time For Goal  Achievement: 08/17/14 Potential to Achieve Goals: Good    Frequency Min 3X/week   Barriers to discharge Decreased caregiver support Patient lives alone.    Co-evaluation               End of Session Equipment Utilized During Treatment: Gait belt Activity Tolerance: Patient tolerated treatment well Patient left: in bed;with call bell/phone within reach;with family/visitor present Nurse Communication: Mobility status         Time: [id number] PT Time Calculation (min) (ACUTE ONLY): 15 min   Charges:   PT Evaluation $Initial PT Evaluation Tier I: 1 Procedure     PT G CodesDespina Pole 08/25/2014, 4:47 PM Carita Pian. Sanjuana Kava, [place] Pager [phone]

## 2014-08-11 DIAGNOSIS — J101 Influenza due to other identified influenza virus with other respiratory manifestations: Secondary | ICD-10-CM

## 2014-08-11 LAB — BASIC METABOLIC PANEL
Anion gap: 6 (ref 5–15)
BUN: 7 mg/dL (ref 6–23)
CALCIUM: 8.7 mg/dL (ref 8.4–10.5)
CHLORIDE: 108 mmol/L (ref 96–112)
CO2: 26 mmol/L (ref 19–32)
CREATININE: 0.74 mg/dL (ref 0.50–1.10)
Glucose, Bld: 142 mg/dL — ABNORMAL HIGH (ref 70–99)
POTASSIUM: 3.9 mmol/L (ref 3.5–5.1)
Sodium: 140 mmol/L (ref 135–145)

## 2014-08-11 LAB — CBC
HCT: 36.2 % (ref 36.0–46.0)
Hemoglobin: 12.2 g/dL (ref 12.0–15.0)
MCH: 26.5 pg (ref 26.0–34.0)
MCHC: 33.7 g/dL (ref 30.0–36.0)
MCV: 78.7 fL (ref 78.0–100.0)
Platelets: 133 10*3/uL — ABNORMAL LOW (ref 150–400)
RBC: 4.6 MIL/uL (ref 3.87–5.11)
RDW: 14.8 % (ref 11.5–15.5)
WBC: 6.5 10*3/uL (ref 4.0–10.5)

## 2014-08-11 LAB — GLUCOSE, CAPILLARY
GLUCOSE-CAPILLARY: 162 mg/dL — AB (ref 70–99)
GLUCOSE-CAPILLARY: 90 mg/dL (ref 70–99)
GLUCOSE-CAPILLARY: 231 mg/dL — AB (ref 70–99)
Glucose-Capillary: 210 mg/dL — ABNORMAL HIGH (ref 70–99)
Glucose-Capillary: 281 mg/dL — ABNORMAL HIGH (ref 70–99)
Glucose-Capillary: 93 mg/dL (ref 70–99)

## 2014-08-11 MED ORDER — BENZONATATE 100 MG PO CAPS
100.0000 mg | ORAL_CAPSULE | Freq: Three times a day (TID) | ORAL | Status: DC
  Administered 2014-08-11 – 2014-08-12 (×5): 100 mg via ORAL
  Filled 2014-08-11 (×5): qty 1

## 2014-08-11 MED ORDER — DM-GUAIFENESIN ER 30-600 MG PO TB12
1.0000 | ORAL_TABLET | Freq: Two times a day (BID) | ORAL | Status: DC
  Administered 2014-08-11 – 2014-08-12 (×2): 1 via ORAL
  Filled 2014-08-11 (×3): qty 1

## 2014-08-11 NOTE — Progress Notes (Signed)
[facility] PROGRESS NOTE  Amy [name] [id number] DOB: 04-Feb-1961 DOA: 08/08/2014 PCP: Velna Hatchet, MD  Assessment/Plan:  Principal Problem:   DM (diabetes mellitus), type 2, uncontrolled, now improved on 30 units levemir (dropped low on 40) Active Problems:   Dehydration: improved   Blindness: has an aide. Lives alone and works   Hypothyroidism Influenza a h1n1. Started tamiflu. Does not feel well enough to go home. Adjust antitussives   Elevated troponin: trend flat. No CP. No evidence of ACS   Nausea vomiting and diarrhea: resolved   Hypoglycemia: none further  HPI/Subjective: Still feels poorly.  Coughing. Does not feel well enough to go home  Objective: Filed Vitals:   08/11/14 0930  BP: 141/77  Pulse: 63  Temp: 97.9 F (36.6 C)  Resp: 18    Intake/Output Summary (Last 24 hours) at 08/11/14 1049 Last data filed at 08/11/14 0930  Gross per 24 hour  Intake    540 ml  Output   2375 ml  Net  -1835 ml   Filed Weights   08/08/14 2030 08/09/14 2031 08/10/14 2022  Weight: 76.658 kg (169 lb) 80.287 kg (177 lb) 78.472 kg (173 lb)    Exam:   General:  In chair. Coughing.  Cardiovascular: RRR without MGR  Respiratory: rhonchi. No wheeze or rales  Abdomen: S, NT, ND  Ext: no CCE  Basic Metabolic Panel:  Recent Labs Lab 08/08/14 0925 08/08/14 0945 08/08/14 1120 08/09/14 0325 08/11/14 0549  NA 137 136 138 144 140  K 4.2 3.9 3.7 3.5 3.9  CL 99 99 102 114* 108  CO2  --  29 22 26 26   GLUCOSE 632* 640* 501* 42* 142*  BUN 22 20 20 12 7   CREATININE 1.20* 1.37* 1.18* 0.85 0.74  CALCIUM  --  8.7 8.8 7.8* 8.7   Liver Function Tests:  Recent Labs Lab 08/08/14 0945 08/08/14 1120  AST 14 18  ALT 14 14  ALKPHOS 112 106  BILITOT 0.6 0.5  PROT 6.8 6.7  ALBUMIN 3.0* 2.9*   No results for input(s): LIPASE, AMYLASE in the last 168 hours. No results for input(s): AMMONIA in the last 168 hours. CBC:  Recent Labs Lab 08/08/14 0925  08/08/14 0945 08/08/14 1120 08/09/14 0325 08/11/14 0549  WBC  --  6.7 7.1 6.3 6.5  NEUTROABS  --  4.0 4.0  --   --   HGB 14.6 12.8 13.1 11.7* 12.2  HCT 43.0 39.0 38.6 36.1 36.2  MCV  --  79.8 78.6 80.4 78.7  PLT  --  135* 137* 135* 133*   Cardiac Enzymes:  Recent Labs Lab 08/08/14 1120 08/08/14 1650 08/08/14 2127 08/09/14 0325  TROPONINI 0.04* <0.03 <0.03 <0.03   BNP (last 3 results) No results for input(s): BNP in the last 8760 hours.  ProBNP (last 3 results) No results for input(s): PROBNP in the last 8760 hours.  CBG:  Recent Labs Lab 08/10/14 1602 08/10/14 2025 08/11/14 0008 08/11/14 0432 08/11/14 0739  GLUCAP 167* 220* 281* 162* 90    No results found for this or any previous visit (from the past 240 hour(s)).   Studies: No results found.  Scheduled Meds: . clobetasol cream  1 application Topical BID  . enoxaparin (LOVENOX) injection  40 mg Subcutaneous Q24H  . guaiFENesin  600 mg Oral BID  . insulin aspart  0-15 Units Subcutaneous TID WC  . insulin aspart  0-5 Units Subcutaneous QHS  . insulin detemir  30 Units Subcutaneous QHS  .  levothyroxine  137 mcg Oral QAC breakfast  . Linaclotide  145 mcg Oral q morning - 10a  . midodrine  5 mg Oral TID WC  . montelukast  10 mg Oral QHS  . oseltamivir  75 mg Oral BID  . pantoprazole  40 mg Oral Daily  . potassium chloride SA  20 mEq Oral q morning - 10a  . pregabalin  100 mg Oral TID  . simvastatin  20 mg Oral QHS  . sodium chloride  3 mL Intravenous Q12H   Continuous Infusions:    Time spent: 15 minutes  [facility]  [email or web address], password [facility] 08/11/2014, 10:49 AM  LOS: 3 days

## 2014-08-11 NOTE — Care Management Note (Signed)
CARE MANAGEMENT NOTE 08/11/2014  Patient:  Amy [name], Amy [name]   Account Number:  [id number]  Date Initiated:  08/11/2014  Documentation initiated by:  Deniss Wormley  Subjective/Objective Assessment:   CM following for progression and d/c planning.     Action/Plan:   Met with pt who has a HH aide 7 days per week for 2.5 hr per day, this pt works at [employer] for [employer] during the day and the aide assist in the home in the evening.   Anticipated DC Date:  08/13/2014   Anticipated DC Plan:  [place]         Choice offered to / List presented to:          [facility] arranged  HH-8 PCS/PERSONAL CARE SERVICES      Status of service:   Medicare Important Message given?  YES (If response is "NO", the following Medicare IM given date fields will be blank) Date Medicare IM given:  08/11/2014 Medicare IM given by:  Jeray Shugart Date Additional Medicare IM given:   Additional Medicare IM given by:    Discharge Disposition:    Per UR Regulation:    If discussed at Long Length of Stay Meetings, dates discussed:    Comments:

## 2014-08-12 DIAGNOSIS — R7989 Other specified abnormal findings of blood chemistry: Secondary | ICD-10-CM

## 2014-08-12 LAB — HEMOGLOBIN A1C
Hgb A1c MFr Bld: 10.8 % — ABNORMAL HIGH (ref 4.8–5.6)
Mean Plasma Glucose: 263 mg/dL

## 2014-08-12 LAB — GLUCOSE, CAPILLARY
Glucose-Capillary: 76 mg/dL (ref 70–99)
Glucose-Capillary: 178 mg/dL — ABNORMAL HIGH (ref 70–99)

## 2014-08-12 MED ORDER — OSELTAMIVIR PHOSPHATE 75 MG PO CAPS: 75.0000 mg | ORAL_CAPSULE | Freq: Two times a day (BID) | ORAL | Status: DC

## 2014-08-12 MED ORDER — INSULIN DETEMIR 100 UNIT/ML FLEXPEN: 30.0000 [IU] | Freq: Every day | SUBCUTANEOUS | Status: DC

## 2014-08-12 MED ORDER — BENZONATATE 100 MG PO CAPS: 100.0000 mg | ORAL_CAPSULE | Freq: Three times a day (TID) | ORAL | Status: DC

## 2014-08-12 NOTE — Discharge Summary (Signed)
Physician Discharge Summary  LONNA MAS [id number] DOB: March 14, 1961 DOA: 08/08/2014  PCP: Velna Hatchet, MD  Admit date: 08/08/2014 Discharge date: 08/12/2014  Time spent: 35 minutes  Recommendations for Outpatient Follow-up:  1. Monitor blood sugars and bring to PCP 2. Finish tamiflu  Discharge Diagnoses:  Principal Problem:   DM (diabetes mellitus), type 2, uncontrolled, with hyperosmolarity Active Problems:   Dehydration   Blindness   Hypothyroidism   Influenza A (H1N1)   Elevated troponin   Nausea vomiting and diarrhea   Hypoglycemia   Discharge Condition: improved  Diet recommendation: diabetic/cardiac  Filed Weights   08/09/14 2031 08/10/14 2022 08/11/14 2034  Weight: 80.287 kg (177 lb) 78.472 kg (173 lb) 77.338 kg (170 lb 8 oz)    History of present illness:  Amy [name] is a 54 y.o. female with history of type II DM/IDDM, HTN, blindness in the right eye, false left eye, hypothyroid, HLD, asthma, sent from urgent care center with above complaints. Patient gives 3-4 day history of nonproductive cough, anterior chest pain only on coughing, intermittent dyspnea but denies fever or chills. She has been using her home inhalers without significant relief. She also gives 2 days history of 3-4 episodes of nonbloody emesis and diarrhea it out abdominal pain. Appetite has decreased. States that diarrhea and vomiting have resolved since yesterday. She does not check her home blood sugars because of nonfunctioning glucometer. She claims compliance with her diabetic medications but not to the diet. At the urgent care center, her CBG was noted to be 632 mg per DL. She was sent to the ED where she was hydrated with IV fluids and started on IV insulin by glucose stabilizer and her CBGs have gradually dropped to 205 mg per DL. Patient denies any other complaints. In the ED, glucose 501, creatinine 1.18, albumin 2.9 and platelets 137 with troponin of 0.04. Patient denies ischemic  type of CP.  Hospital Course:  DM (diabetes mellitus), type 2, uncontrolled, now improved on 30 units levemir (dropped low on 40) atelectasis on x ray- incentive spirometry, no fever to signify a PNA  Dehydration: improved  Blindness: has an aide. Lives alone and works  Hypothyroidism Influenza a h1n1. Started tamiflu.   Elevated troponin: trend flat. No CP. No evidence of ACS  Nausea vomiting and diarrhea: resolved  Hypoglycemia: none further  Procedures:    Consultations:  Discharge Exam: Filed Vitals:   08/12/14 0859  BP: 131/72  Pulse: 68  Temp: 98.2 F (36.8 C)  Resp: 18    General: a+Ox3, NAD   Discharge Instructions   Discharge Instructions    Diet - low sodium heart healthy    Complete by:  As directed      Diet Carb Modified    Complete by:  As directed      Increase activity slowly    Complete by:  As directed           Current Discharge Medication List    START taking these medications   Details  benzonatate (TESSALON) 100 MG capsule Take 1 capsule (100 mg total) by mouth 3 (three) times daily. With cough Qty: 20 capsule, Refills: 0    oseltamivir (TAMIFLU) 75 MG capsule Take 1 capsule (75 mg total) by mouth 2 (two) times daily. Qty: 6 capsule, Refills: 0      CONTINUE these medications which have CHANGED   Details  insulin detemir (LEVEMIR) 100 unit/ml SOLN Inject 0.3 mLs (30 Units total) into the skin at  bedtime.      CONTINUE these medications which have NOT CHANGED   Details  albuterol (PROVENTIL HFA;VENTOLIN HFA) 108 (90 BASE) MCG/ACT inhaler Inhale 2 puffs into the lungs every 4 (four) hours as needed. For shortness of breath    clobetasol cream (TEMOVATE) [id number] % Apply 1 application topically 2 (two) times daily.    Diclofenac Sodium 1.5 % SOLN Place 1 each onto the skin daily as needed (pain).    Dulaglutide (TRULICITY [place]) Inject 1.7 mLs into the skin once a week. Takes on Wednesday    empagliflozin (JARDIANCE) 10 MG TABS  tablet Take 10 mg by mouth daily.    esomeprazole (NEXIUM) 40 MG capsule Take 40 mg by mouth every morning.     furosemide (LASIX) 20 MG tablet Take 20 mg by mouth at bedtime.    HUMALOG KWIKPEN 100 UNIT/ML KiwkPen Inject 5 Units into the skin daily before supper.    levothyroxine (SYNTHROID, LEVOTHROID) 137 MCG tablet Take 137 mcg by mouth daily before breakfast.    lidocaine (XYLOCAINE) 5 % ointment Apply 1 application topically as needed for mild pain.     Linaclotide (LINZESS) 145 MCG CAPS capsule Take 145 mcg by mouth every morning.    midodrine (PROAMATINE) 5 MG tablet Take 5 mg by mouth 3 (three) times daily with meals.     montelukast (SINGULAIR) 10 MG tablet Take 10 mg by mouth at bedtime.    naproxen sodium (ANAPROX) 220 MG tablet Take 220 mg by mouth 2 (two) times daily as needed (pain).    polyethylene glycol powder (GLYCOLAX/MIRALAX) powder Take 17 g by mouth daily.    potassium chloride SA (K-DUR,KLOR-CON) 20 MEQ tablet Take 20 mEq by mouth every morning.    pregabalin (LYRICA) 100 MG capsule Take 100 mg by mouth 3 (three) times daily.    simvastatin (ZOCOR) 20 MG tablet Take 20 mg by mouth at bedtime.        Allergies  Allergen Reactions  . Codeine Nausea Only  . Iohexol      Code: HIVES, Desc: pt developed itching and hives along with nasal congestion; needs 13 hour premeds for future studies, Onset Date: [id number]   . Penicillins Hives  . Sulfa Antibiotics Nausea And Vomiting   Follow-up Information    Follow up with Velna Hatchet, MD In 1 week.   Specialty:  Internal Medicine   Contact information:   [place] [address] [phone]        The results of significant diagnostics from this hospitalization (including imaging, microbiology, ancillary and laboratory) are listed below for reference.    Significant Diagnostic Studies: Dg Chest 2 View  08/08/2014   CLINICAL DATA:  Cough and congestion .  EXAM: CHEST  2 VIEW   COMPARISON:  None.  FINDINGS: Mediastinum and hilar structures normal. Low lung volumes with mild basilar atelectasis and/or infiltrate. No pleural effusion or pneumothorax. Heart size normal.  IMPRESSION: Mild bibasilar atelectasis and/or infiltrates.   Electronically Signed   By: Marcello Moores  Register   On: 08/08/2014 13:13    Microbiology: No results found for this or any previous visit (from the past 240 hour(s)).   Labs: Basic Metabolic Panel:  Recent Labs Lab 08/08/14 0925 08/08/14 0945 08/08/14 1120 08/09/14 0325 08/11/14 0549  NA 137 136 138 144 140  K 4.2 3.9 3.7 3.5 3.9  CL 99 99 102 114* 108  CO2  --  29 22 26 26   GLUCOSE 632* 640* 501* 42* 142*  BUN 22 20 20 12 7   CREATININE 1.20* 1.37* 1.18* 0.85 0.74  CALCIUM  --  8.7 8.8 7.8* 8.7   Liver Function Tests:  Recent Labs Lab 08/08/14 0945 08/08/14 1120  AST 14 18  ALT 14 14  ALKPHOS 112 106  BILITOT 0.6 0.5  PROT 6.8 6.7  ALBUMIN 3.0* 2.9*   No results for input(s): LIPASE, AMYLASE in the last 168 hours. No results for input(s): AMMONIA in the last 168 hours. CBC:  Recent Labs Lab 08/08/14 0925 08/08/14 0945 08/08/14 1120 08/09/14 0325 08/11/14 0549  WBC  --  6.7 7.1 6.3 6.5  NEUTROABS  --  4.0 4.0  --   --   HGB 14.6 12.8 13.1 11.7* 12.2  HCT 43.0 39.0 38.6 36.1 36.2  MCV  --  79.8 78.6 80.4 78.7  PLT  --  135* 137* 135* 133*   Cardiac Enzymes:  Recent Labs Lab 08/08/14 1120 08/08/14 1650 08/08/14 2127 08/09/14 0325  TROPONINI 0.04* <0.03 <0.03 <0.03   BNP: BNP (last 3 results) No results for input(s): BNP in the last 8760 hours.  ProBNP (last 3 results) No results for input(s): PROBNP in the last 8760 hours.  CBG:  Recent Labs Lab 08/11/14 0739 08/11/14 1150 08/11/14 1614 08/11/14 2031 08/12/14 0734  GLUCAP 90 93 210* 231* 76       Signed:  Mickelle Goupil  [facility] 08/12/2014, 10:56 AM

## 2014-08-12 NOTE — Progress Notes (Addendum)
Inpatient Diabetes Program Recommendations  AACE/ADA: New Consensus Statement on Inpatient Glycemic Control (2013)  Target Ranges:  Prepandial:   less than 140 mg/dL      Peak postprandial:   less than 180 mg/dL (1-2 hours)      Critically ill patients:  140 - 180 mg/dL     Results for Amy [name], Amy [name] (MRN [id number]) as of 08/12/2014 13:14  Ref. Range 08/12/2014 07:34 08/12/2014 11:40  Glucose-Capillary Latest Range: 70-99 mg/dL 76 178 (H)    Results for [name], [name] (MRN [id number]) as of 08/12/2014 13:14  Ref. Range 08/11/2014 05:51  Hemoglobin A1C Latest Range: 4.8-5.6 % 10.8 (H)    Admit with: Hyperglycemia/ Bronchitis  History: DM, HTN, Blindness  Home DM Meds: Jardiance 10 mg daily      Trulicity 1.7 mg Qweek      Levemir 35 units QHS      Humalog 5 units tidwc  Current DM Orders: Levemir 30 units QHS            Novolog Moderate SSI tidwc     **Spoke to patient about her current A1c of 10.8%.  Explained what an A1c is and what it measures.  Reminded patient that her goal A1c is 7% or less per ADA standards to prevent both acute and long-term complications.  Explained to patient the extreme importance of good glucose control at home.  Encouraged patient to check her CBGs at least tid at home and to record all CBGs in a logbook for her PCP to review.  **Patient told me she doesn't check her CBGs often at home b/c her meter does not work well.  Per patient, the meter always tells her she has an error with the blood sample.  Perhaps patient's home health aide could assist patient with checking patient's technique on her home glucose meter.  Patient requested a new talking CBG meter.  Will ask MD to place an order for new CBG meter at time of d/c.  **Patient had questions about her diet at home.  Discussed basic DM diet information with patient.  Encouraged patient to avoid beverages with sugar (regular soda, sweet tea, lemonade, fruit juice) and to consume mostly water.   Discussed what foods contain carbohydrates and how carbohydrates affect the body's blood sugar levels.  Encouraged patient to be careful with her portion sizes (especially grains, starchy vegetables, and fruits).  Explained to patient that women should have 45-60 grams of carbohydrates per meal per day.  Encouraged patient to eat more vegetables and eat smaller portion sizes of starchy foods like potatoes, corn, rice, pasta, cereal, etc.    MD- Patient states she would like a Rx for a new CBG at time of d/c.  Please place order for CBG meter at d/c.  Use Order # [id number] and specify "talking meter- patient is blind".     Will follow Wyn Quaker RN, MSN, CDE Diabetes Coordinator Inpatient Diabetes Program Team Pager: [phone] (8a-5p)

## 2014-08-12 NOTE — Progress Notes (Signed)
Patient walked out of bed to chair and around room and oxygen saturations maintained at 96% on room air.  Patient verbalizes no shortness or breath and heartrate maintained in the 80s.

## 2014-08-12 NOTE — Progress Notes (Signed)
SATURATION QUALIFICATIONS: (This note is used to comply with regulatory documentation for home oxygen)  Patient Saturations on Room Air at Rest = 98%  Patient Saturations on Room Air while Ambulating = 96%  Patient Saturations on 0 Liters of oxygen while Ambulating = 96%  Please briefly explain why patient needs home oxygen:  Patient doesn't require home O2.

## 2014-08-12 NOTE — Progress Notes (Signed)
Physical Therapy Treatment Patient Details Name: Amy [name] MRN: [name] DOB: December 27, 1960 Today's Date: 08/12/2014    History of Present Illness Patient is a 54 yo female admitted 08/08/14 with uncontrolled DM, URI, nausea/vomiting.  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 F/u at pain clinic)     Equipment Recommendations  Rolling walker with 5" wheels    Recommendations for Other Services       Precautions / Restrictions Precautions Precautions: Fall Precaution Comments: unfamiliar surroundings    Mobility  Bed Mobility Overal bed mobility: Modified Independent                Transfers Overall transfer level: Modified independent Equipment used: None             General transfer comment: Increased time to move to standing.  Holding on to bed rail.  Ambulation/Gait Ambulation/Gait assistance: Min assist;Mod assist Ambulation Distance (Feet): 120 Feet Assistive device: Rolling walker (2 wheeled) (with cane for blind) Gait Pattern/deviations: Decreased stride length (erratic step width) Gait velocity: Decreased   General Gait Details: Less steady today without UE support and using pathfinding cane, with one loss of balance requiring mod assist to prevent fall; better with use of RW   Stairs Stairs: Yes Stairs assistance: Min guard Stair Management: One rail Right;One rail Left;Step to pattern;Forwards (and using cane for the blind) Number of Stairs: 12 General stair comments: ascended using  alternating pattern; descended with step-to pattern; Noted short of breath after negotiating steps, but recovered with seated rest  Wheelchair Mobility    Modified Rankin (Stroke Patients Only)       Balance Overall balance assessment: History of Falls                                  Cognition Arousal/Alertness: Awake/alert Behavior During Therapy: WFL for tasks assessed/performed Overall Cognitive Status: Within Functional Limits for tasks assessed                      Exercises      General Comments        Pertinent Vitals/Pain Pain Assessment: 0-10 Pain Score: 6  Pain Location: Back, neck and L knee Pain Descriptors / Indicators: Aching (chronic) Pain Intervention(s): Monitored during session    Home Living                      Prior Function            PT Goals (current goals can now be found in the care plan section) Acute Rehab PT Goals Patient Stated Goal: To manage steps at [employer] [facility] PT Goal Formulation: With patient Time For Goal Achievement: 08/17/14 Potential to Achieve Goals: Good Progress towards PT goals: Progressing toward goals    Frequency  Min 3X/week    PT Plan Current plan remains appropriate    Co-evaluation  End of Session Equipment Utilized During Treatment: Gait belt Activity Tolerance: Patient tolerated treatment well Patient left: in bed;with call bell/phone within reach;with family/visitor present     Time: 1421-1505 PT Time Calculation (min) (ACUTE ONLY): 44 min  Charges:  $Gait Training: 23-37 mins $Therapeutic Activity: 8-22 mins                    G Codes:      Quin Hoop 08/12/2014, 4:13 PM  Roney [place], [place] Pager [phone] Office [phone]

## 2014-08-12 NOTE — Care Management Note (Signed)
CARE MANAGEMENT NOTE 08/12/2014  Patient:  Amy [name], Amy [name]   Account Number:  [id number]  Date Initiated:  08/11/2014  Documentation initiated by:  Ahmeer Tuman  Subjective/Objective Assessment:   CM following for progression and d/c planning.     Action/Plan:   Met with pt who has a HH aide 7 days per week for 2.5 hr per day, this pt works at [employer] for [employer] during the day and the aide assist in the home in the evening.   Anticipated DC Date:  08/12/2014   Anticipated DC Plan:  [place]         Choice offered to / List presented to:          [facility] arranged  HH-8 PCS/PERSONAL CARE SERVICES      Status of service:  Completed, signed off Medicare Important Message given?  YES (If response is "NO", the following Medicare IM given date fields will be blank) Date Medicare IM given:  08/11/2014 Medicare IM given by:  Shakora Nordquist Date Additional Medicare IM given:   Additional Medicare IM given by:    Discharge Disposition:  [place]  Per UR Regulation:    If discussed at Long Length of Stay Meetings, dates discussed:    Comments:

## 2014-08-29 ENCOUNTER — Encounter: Payer: 59 | Admitting: Podiatrist

## 2014-09-09 IMAGING — MR MR HEAD W/O CM
6 of 8 series · 28 of 48 positions shown · non-contrast
Comparison: Head CT without contrast 08/06/2012 and earlier.

CLINICAL DATA: 51-year-old female with dizziness, slurred speech,
difficulty walking times several days.

MRI HEAD WITHOUT CONTRAST
TECHNIQUE: Multiplanar, multiecho pulse sequences of the brain and
surrounding structures were obtained according to standard protocol
without intravenous contrast.

[Series 3: DWI · axial · 5.0mm · 1.09mm/px · z∈[-92,+46]mm · 9 of 60 slices shown (1 of 2)]
[im 1/60]
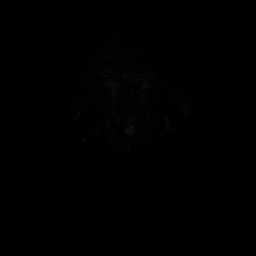
[im 11/60]
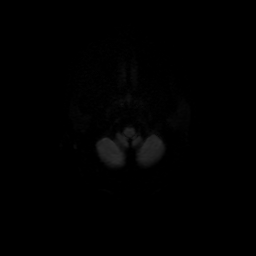
[im 17/60]
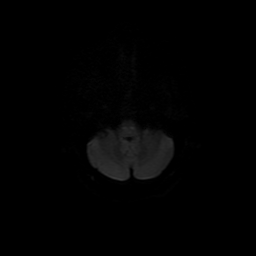
[im 27/60]
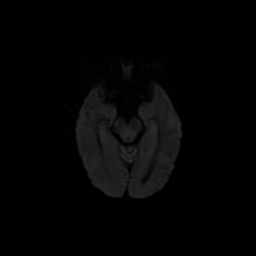
[im 33/60]
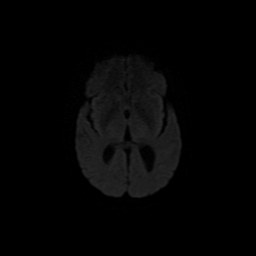
[im 43/60]
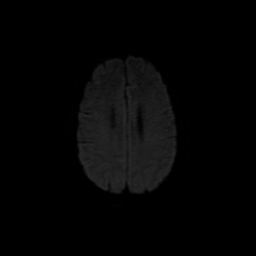
[im 49/60]
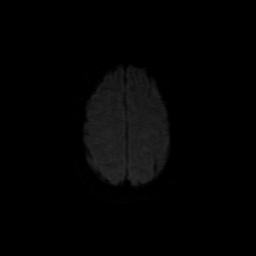
[im 54/60]
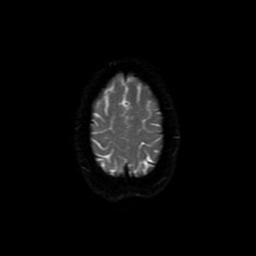
[im 60/60]
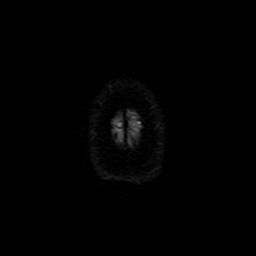

[Series 4: T1 · sagittal · 5.0mm · 0.47mm/px · 2 of 24 slices shown]
[im 1/24]
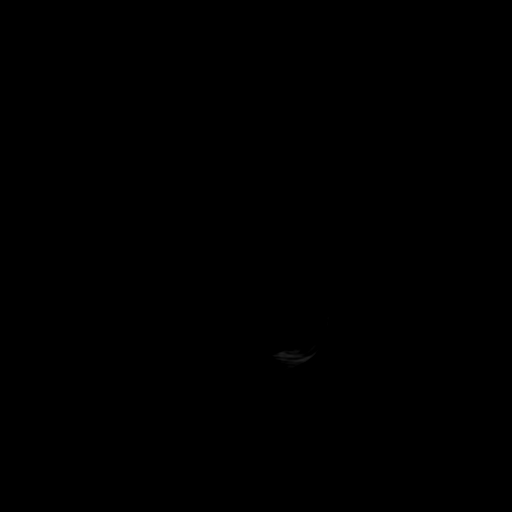
[im 8/24]
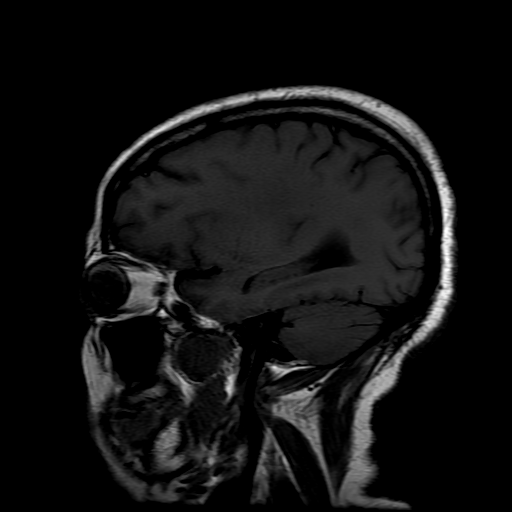

[Series 5: T2 · axial · 5.0mm · 0.43mm/px · z∈[-90,+53]mm · 4 of 24 slices shown (1 of 2)]
[im 1/24]
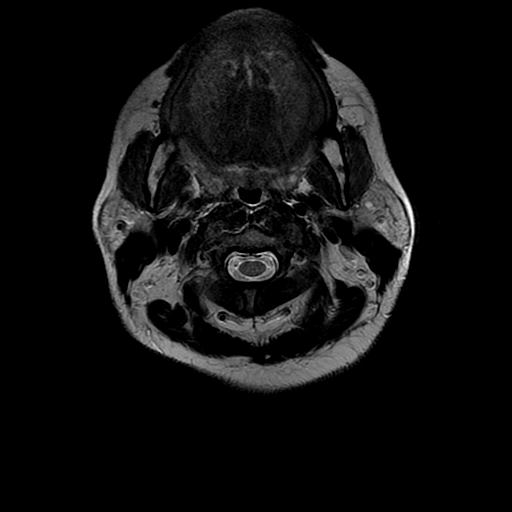
[im 8/24]
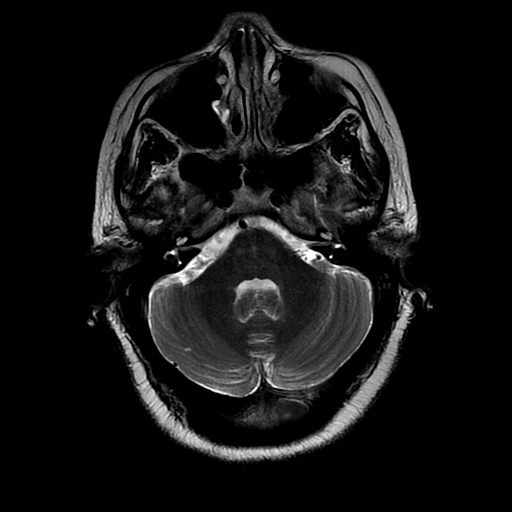
[im 16/24]
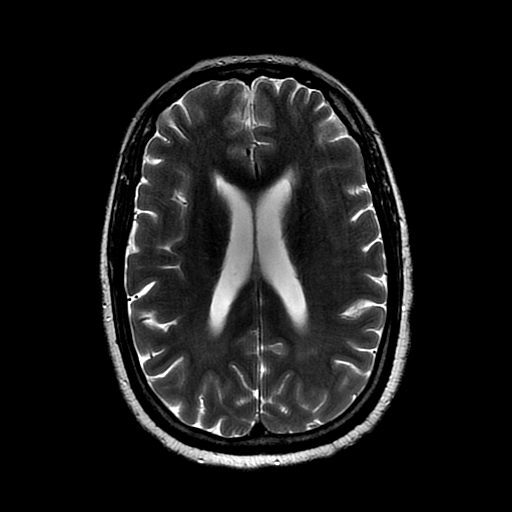
[im 24/24]
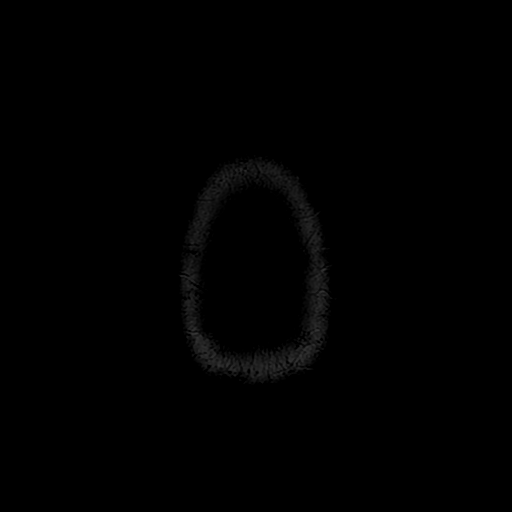

[Series 6: FLAIR · axial · 5.0mm · 0.43mm/px · z∈[-95,+58]mm · 4 of 24 slices shown]
[im 1/24]
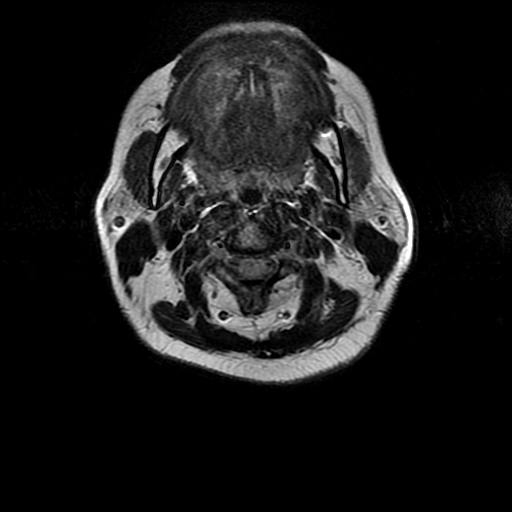
[im 8/24]
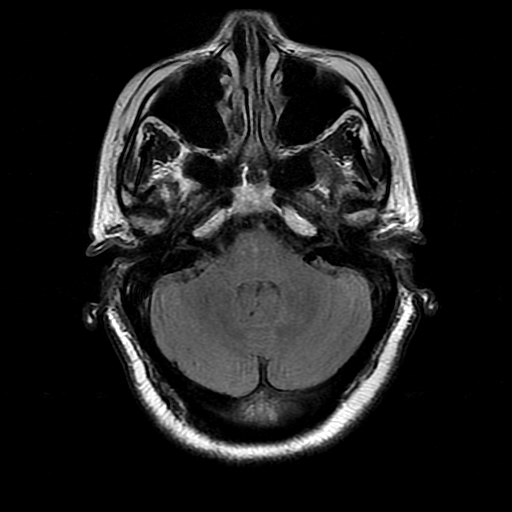
[im 16/24]
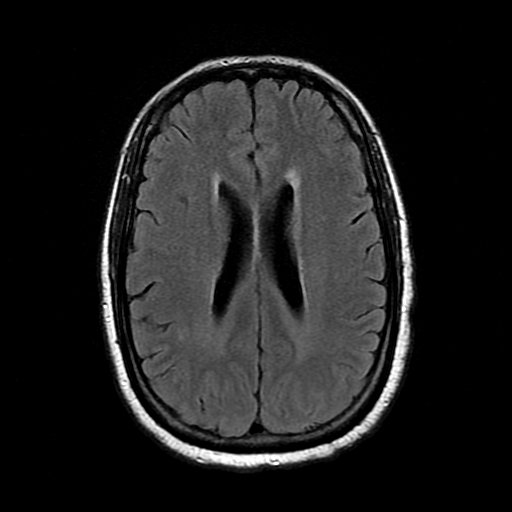
[im 24/24]
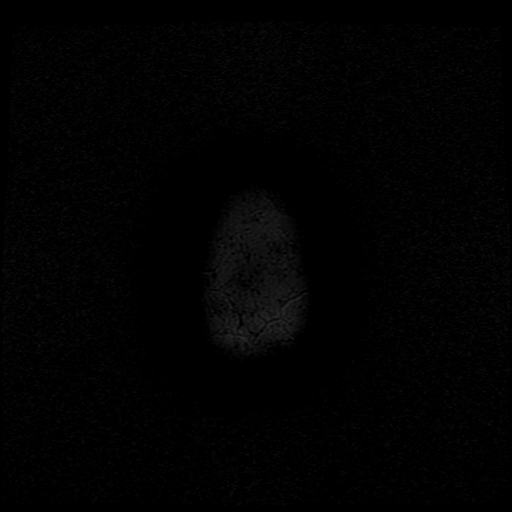

[Series 9: T2 · coronal · 5.0mm · 0.45mm/px · 4 of 24 slices shown (2 of 2)]
[im 1/24]
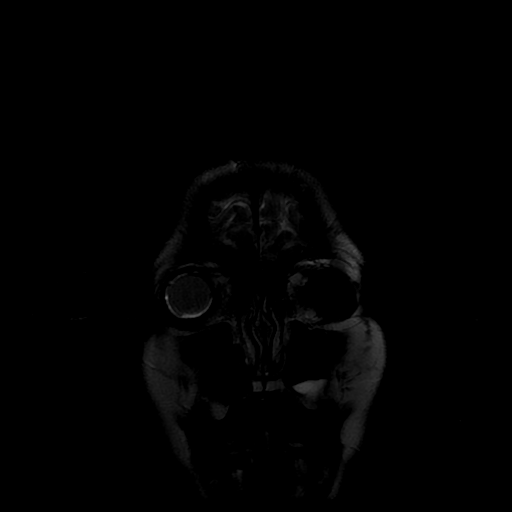
[im 8/24]
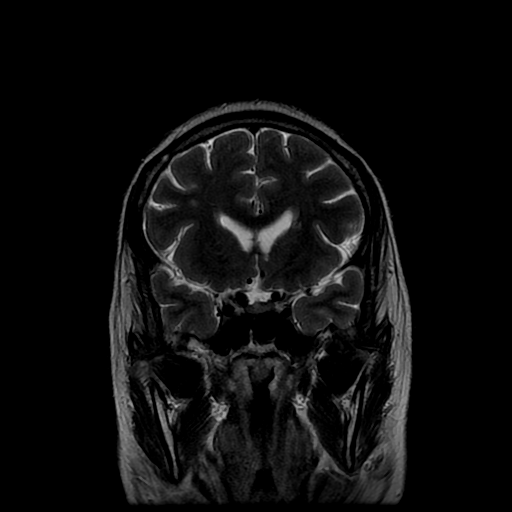
[im 16/24]
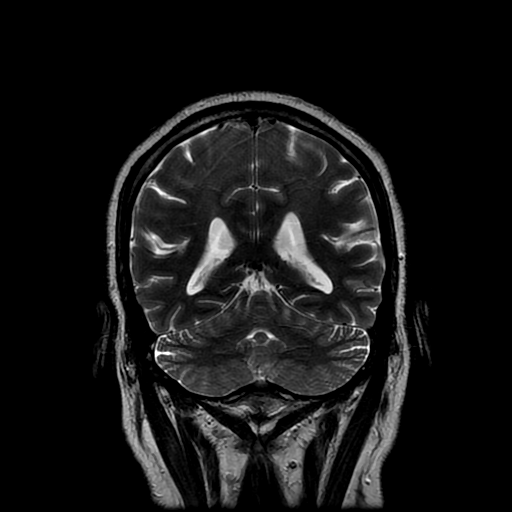
[im 24/24]
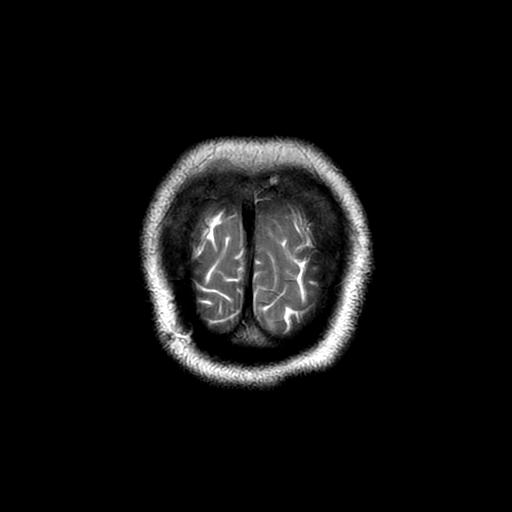

[Series 300: DWI · axial · 5.0mm · 1.09mm/px · z∈[-92,+46]mm · 5 of 30 slices shown (2 of 2)]
[im 1/30]
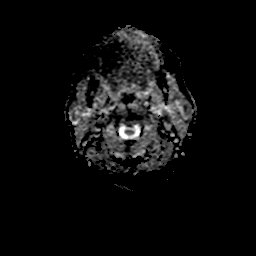
[im 8/30]
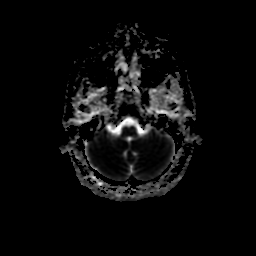
[im 15/30]
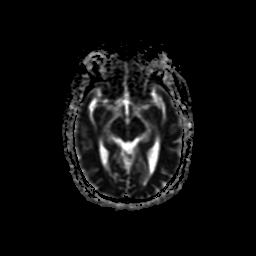
[im 22/30]
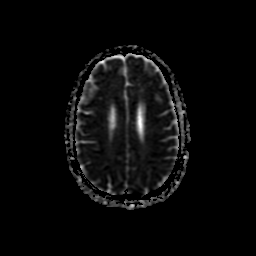
[im 30/30]
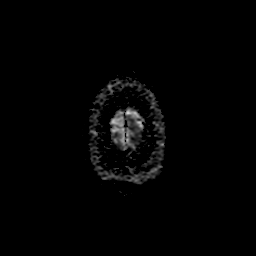

[28 of 48 positions shown; findings below may reference images not displayed]

FINDINGS: No restricted diffusion to suggest acute infarction.  No
midline shift, mass effect, evidence of mass lesion,
ventriculomegaly, extra-axial collection or acute intracranial
hemorrhage.  Cervicomedullary junction and pituitary are within
normal limits.  Major intracranial vascular flow voids are
preserved. Cerebral volume is within normal limits for age.
Visualized cervical spine remarkable for C3-C4 and C4-C5 disc and
endplate degeneration.

Confluent T2 and FLAIR hyperintensity in the pons (series 5 image
9).  No pontine expansion or mass effect.  Other brain stem
structures are within normal limits.  Deep gray matter nuclei are
within normal limits.  There is possibly a tiny chronic lacunar
infarct in the right cerebellar hemisphere (image 8).  Cerebral
white matter T2 and FLAIR hyperintensity is within normal limits
for age.  No cortical encephalomalacia.

Normal bone marrow signal.  Small left maxillary sinus mucous
retention cyst.  Mastoids are clear.  Grossly normal bilateral
internal auditory structures.  Negative scalp soft tissues.
Postoperative changes to the right globe and left orbital
prosthesis.
IMPRESSION: 1.  No acute stroke or definite acute intracranial abnormality.
2.  Confluent signal abnormality in the pons is nonspecific and can
be due simply to ordinary chronic small vessel disease, but
consider also osmotic demyelination in the appropriate clinical
setting.
3.  Supratentorial brain within normal limits for age.  Possible
tiny chronic lacunar infarct in the right cerebellum.
4.  Chronic changes to the orbits.

## 2014-09-09 IMAGING — CT CT HEAD W/O CM
2 series · 16 of 30 positions shown, 20 images · non-contrast
Comparison: 10/20/2008

CLINICAL DATA: Dysphagia.  Slurred speech.  Headache

CT HEAD WITHOUT CONTRAST
TECHNIQUE: Contiguous axial images were obtained from the base of
the skull through the vertex without contrast.

[Series 2: head w/o · axial · non-contrast · 0.43mm/px · z∈[-128,-13]mm · 13 of 27 slices shown, 17 images]
[im 2/27  brain]
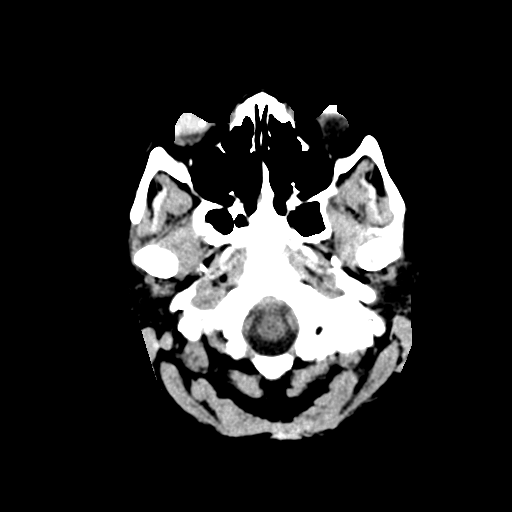
[im 2/27  bone]
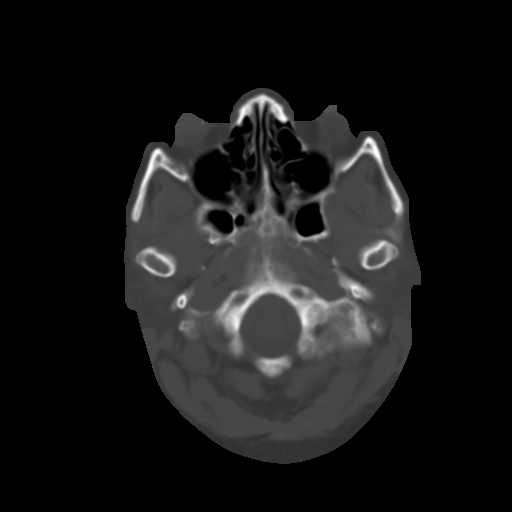
[im 4/27  brain]
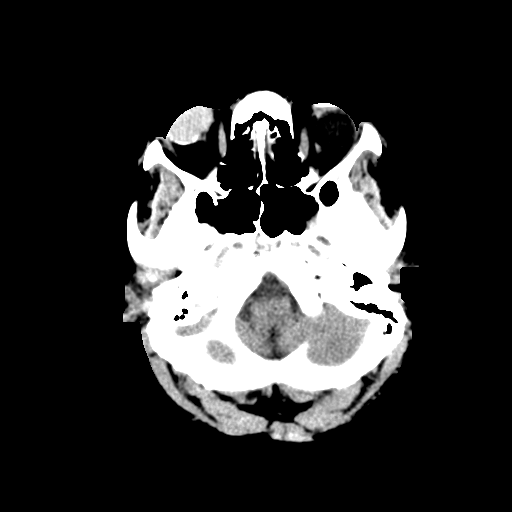
[im 6/27  brain]
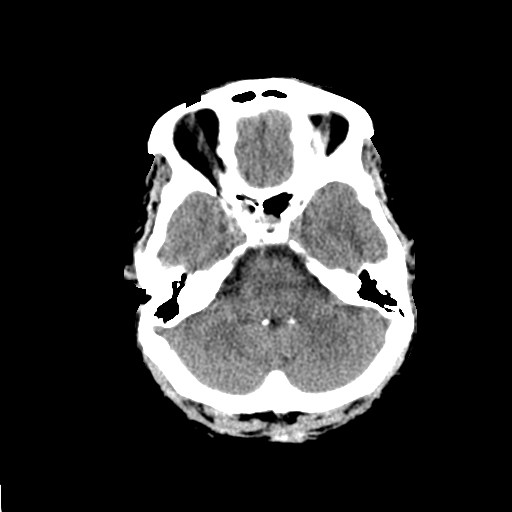
[im 8/27  brain]
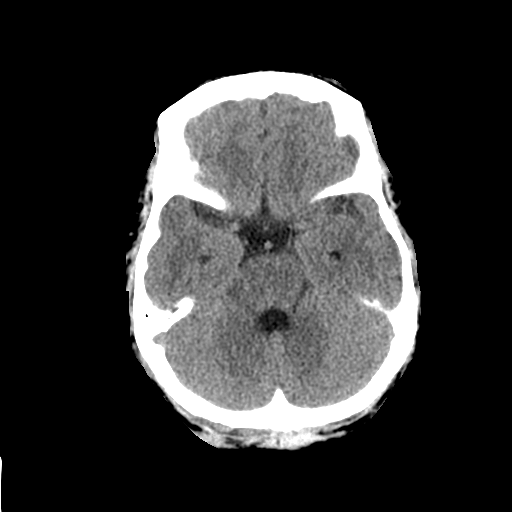
[im 10/27  brain]
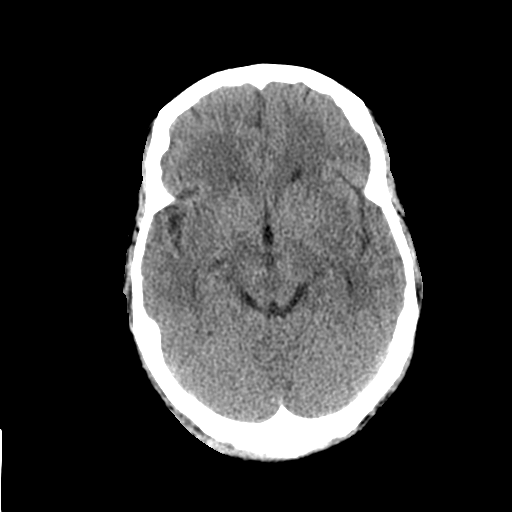
[im 10/27  bone]
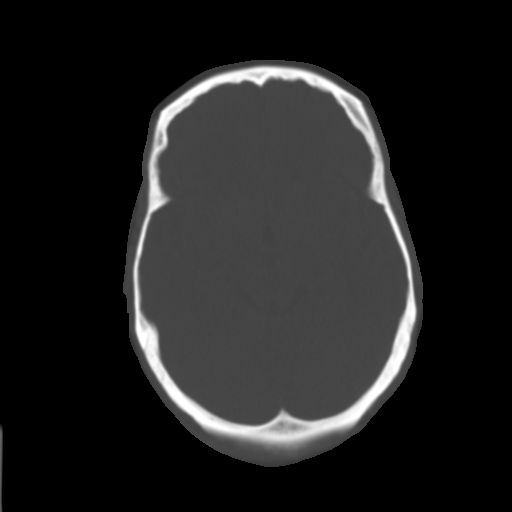
[im 12/27  brain]
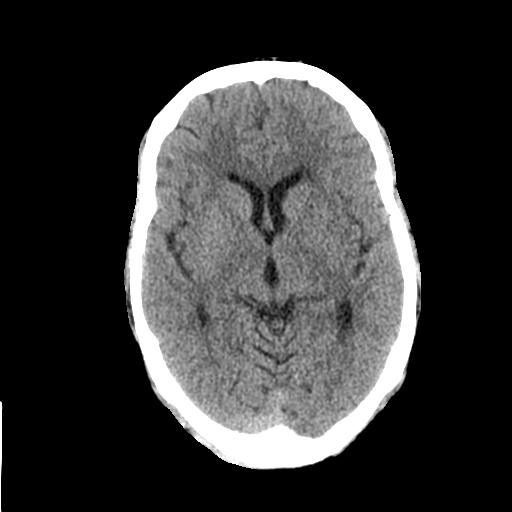
[im 14/27  brain]
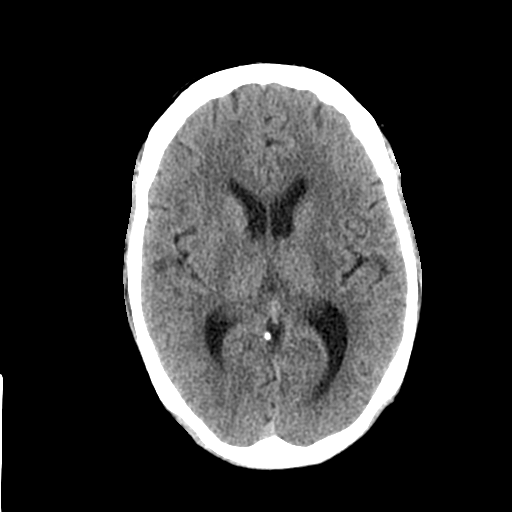
[im 15/27  brain]
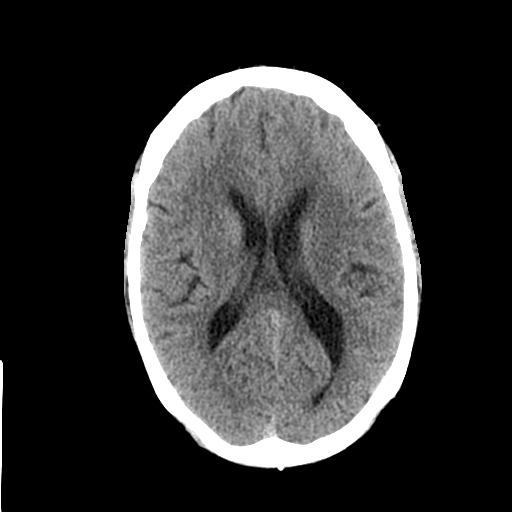
[im 17/27  brain]
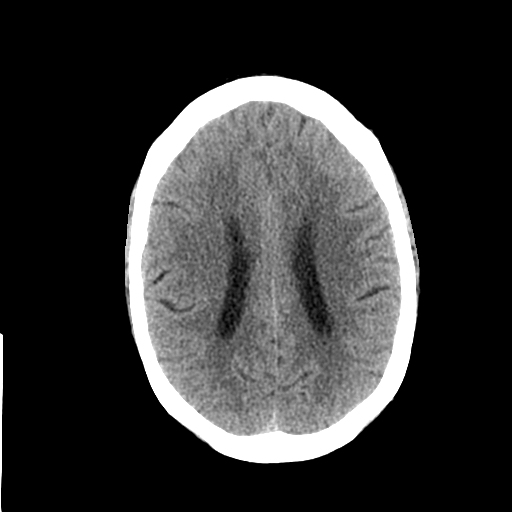
[im 17/27  bone]
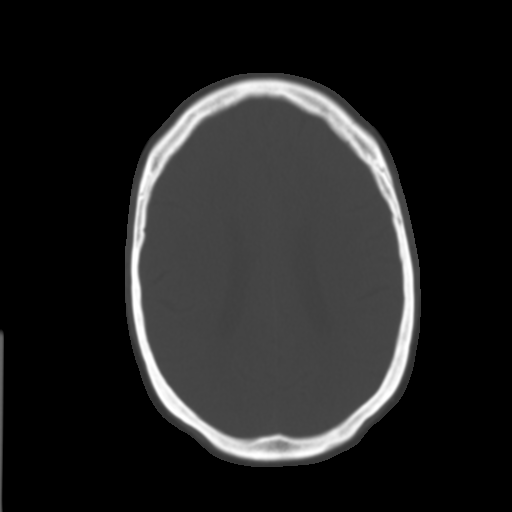
[im 19/27  brain]
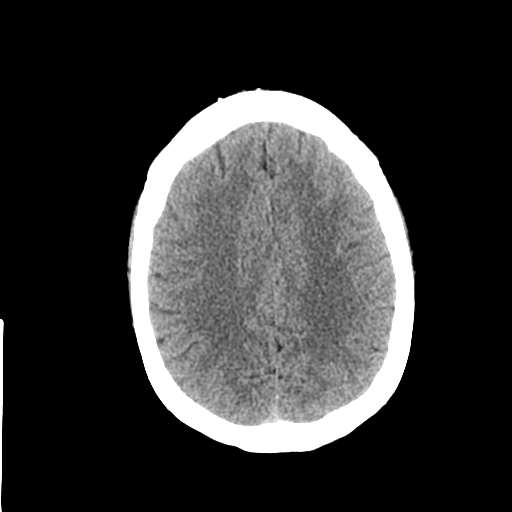
[im 21/27  brain]
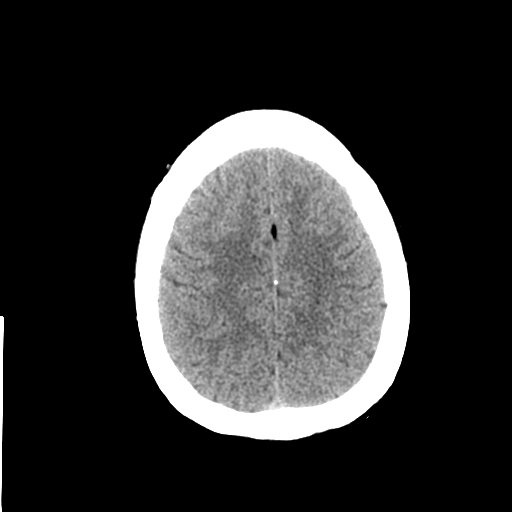
[im 23/27  brain]
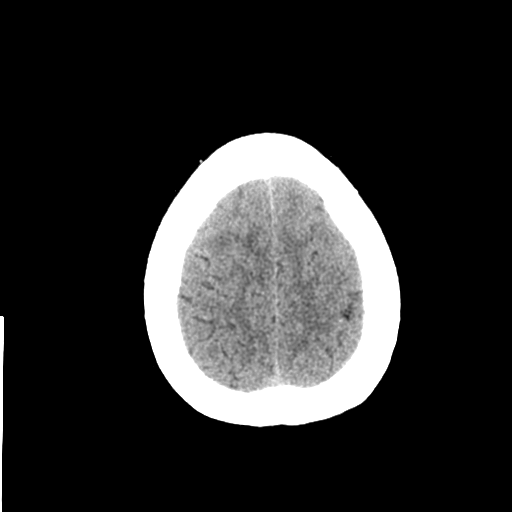
[im 25/27  brain]
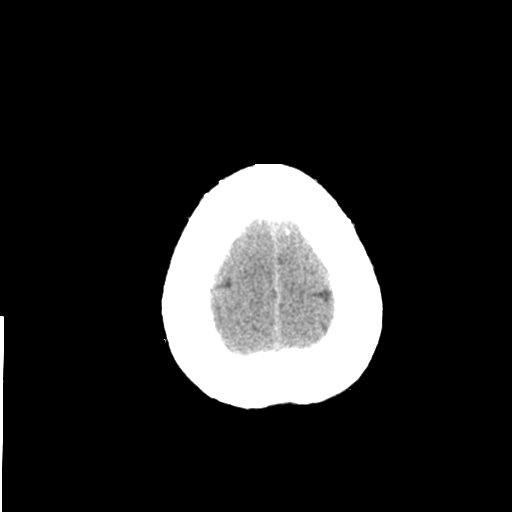
[im 25/27  bone]
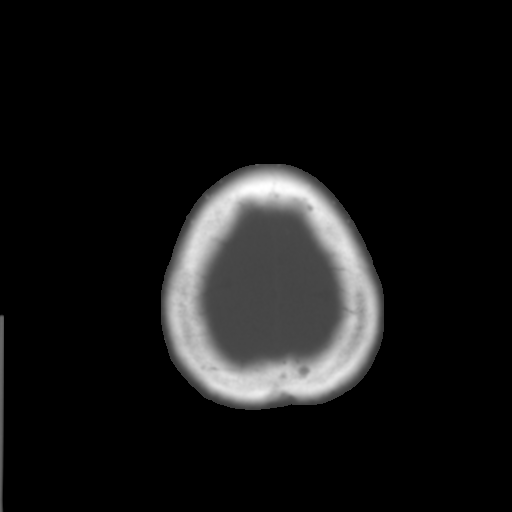

[Series 3: bone windows · axial · 0.43mm/px · z∈[-128,-88]mm · 3 of 27 slices shown]
[im 2/27  bone]
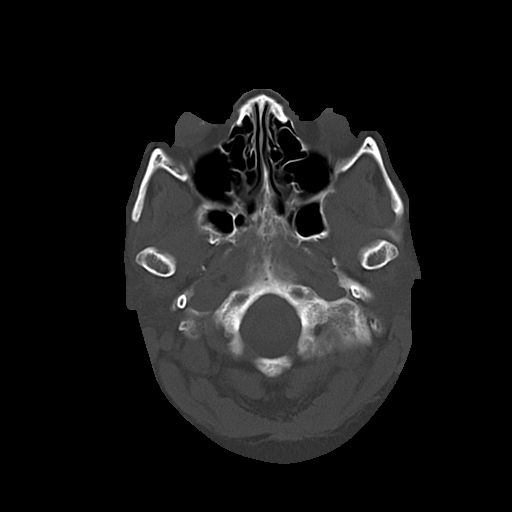
[im 6/27  bone]
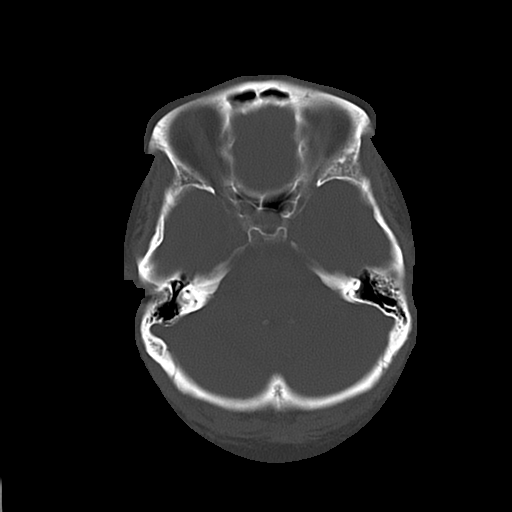
[im 10/27  bone]
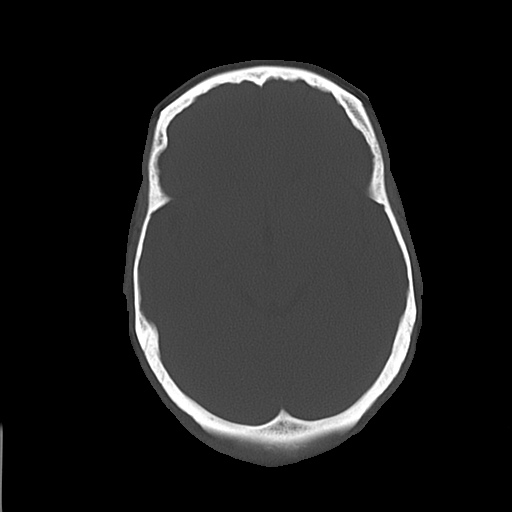

[16 of 30 positions shown; findings below may reference images not displayed]

FINDINGS: There is no evidence for acute hemorrhage, hydrocephalus,
mass lesion, or abnormal extra-axial fluid collection.  No definite
CT evidence for acute infarction.  Prosthetic globe noted in the
left side with evidence of surgical change in the right globe.

The visualized paranasal sinuses and mastoid air cells are clear.
IMPRESSION: Stable exam.  No acute intracranial abnormality.

## 2014-09-10 ENCOUNTER — Ambulatory Visit: Payer: 59 | Admitting: Podiatrist

## 2014-09-14 ENCOUNTER — Encounter (HOSPITAL_COMMUNITY): Payer: Self-pay | Admitting: Emergency Medicine

## 2014-09-14 ENCOUNTER — Emergency Department (HOSPITAL_COMMUNITY): Payer: Medicare Other

## 2014-09-14 ENCOUNTER — Emergency Department (HOSPITAL_COMMUNITY)
Admission: EM | Admit: 2014-09-14 | Discharge: 2014-09-14 | Disposition: A | Discharge status: Home / Self Care | Payer: Medicare Other | Attending: Emergency Medicine | Admitting: Emergency Medicine

## 2014-09-14 DIAGNOSIS — Z8739 Personal history of other diseases of the musculoskeletal system and connective tissue: Secondary | ICD-10-CM | POA: Insufficient documentation

## 2014-09-14 DIAGNOSIS — Z794 Long term (current) use of insulin: Secondary | ICD-10-CM | POA: Insufficient documentation

## 2014-09-14 DIAGNOSIS — I951 Orthostatic hypotension: Secondary | ICD-10-CM | POA: Diagnosis not present

## 2014-09-14 DIAGNOSIS — R739 Hyperglycemia, unspecified: Secondary | ICD-10-CM

## 2014-09-14 DIAGNOSIS — Z880 Allergy status to penicillin: Secondary | ICD-10-CM | POA: Insufficient documentation

## 2014-09-14 DIAGNOSIS — H548 Legal blindness, as defined in USA: Secondary | ICD-10-CM | POA: Insufficient documentation

## 2014-09-14 DIAGNOSIS — E1165 Type 2 diabetes mellitus with hyperglycemia: Secondary | ICD-10-CM | POA: Insufficient documentation

## 2014-09-14 DIAGNOSIS — E039 Hypothyroidism, unspecified: Secondary | ICD-10-CM | POA: Insufficient documentation

## 2014-09-14 DIAGNOSIS — E1140 Type 2 diabetes mellitus with diabetic neuropathy, unspecified: Secondary | ICD-10-CM | POA: Diagnosis not present

## 2014-09-14 DIAGNOSIS — I10 Essential (primary) hypertension: Secondary | ICD-10-CM | POA: Diagnosis not present

## 2014-09-14 DIAGNOSIS — Z87891 Personal history of nicotine dependence: Secondary | ICD-10-CM | POA: Insufficient documentation

## 2014-09-14 DIAGNOSIS — J45909 Unspecified asthma, uncomplicated: Secondary | ICD-10-CM | POA: Diagnosis not present

## 2014-09-14 DIAGNOSIS — Z79899 Other long term (current) drug therapy: Secondary | ICD-10-CM | POA: Diagnosis not present

## 2014-09-14 DIAGNOSIS — R531 Weakness: Secondary | ICD-10-CM | POA: Diagnosis present

## 2014-09-14 DIAGNOSIS — Z7952 Long term (current) use of systemic steroids: Secondary | ICD-10-CM | POA: Diagnosis not present

## 2014-09-14 LAB — URINALYSIS, ROUTINE W REFLEX MICROSCOPIC
Bilirubin Urine: NEGATIVE
Hgb urine dipstick: NEGATIVE
Ketones, ur: NEGATIVE mg/dL
LEUKOCYTES UA: NEGATIVE
Nitrite: NEGATIVE
Protein, ur: NEGATIVE mg/dL
SPECIFIC GRAVITY, URINE: 1.023 (ref 1.005–1.030)
Urobilinogen, UA: 0.2 mg/dL (ref 0.0–1.0)
pH: 7 (ref 5.0–8.0)

## 2014-09-14 LAB — COMPREHENSIVE METABOLIC PANEL
ALT: 17 U/L (ref 14–54)
AST: 19 U/L (ref 15–41)
Albumin: 3.5 g/dL (ref 3.5–5.0)
Alkaline Phosphatase: 113 U/L (ref 38–126)
Anion gap: 10 (ref 5–15)
BUN: 14 mg/dL (ref 6–20)
CALCIUM: 9.4 mg/dL (ref 8.9–10.3)
CO2: 27 mmol/L (ref 22–32)
Chloride: 104 mmol/L (ref 101–111)
Creatinine, Ser: 1.04 mg/dL — ABNORMAL HIGH (ref 0.44–1.00)
GFR calc Af Amer: >60 mL/min (ref >60)
GFR calc non Af Amer: 59 mL/min — ABNORMAL LOW (ref >60)
Glucose, Bld: 294 mg/dL — ABNORMAL HIGH (ref 70–99)
Potassium: 3.5 mmol/L (ref 3.5–5.1)
Sodium: 141 mmol/L (ref 135–145)
TOTAL PROTEIN: 7.4 g/dL (ref 6.5–8.1)
Total Bilirubin: 0.6 mg/dL (ref 0.3–1.2)

## 2014-09-14 LAB — CBC WITH DIFFERENTIAL/PLATELET
Basophils Absolute: 0 10*3/uL (ref 0.0–0.1)
Basophils Relative: 0 % (ref 0–1)
EOS ABS: 0.1 10*3/uL (ref 0.0–0.7)
Eosinophils Relative: 1 % (ref 0–5)
HEMATOCRIT: 43.6 % (ref 36.0–46.0)
Hemoglobin: 14.6 g/dL (ref 12.0–15.0)
LYMPHS ABS: 1.9 10*3/uL (ref 0.7–4.0)
LYMPHS PCT: 19 % (ref 12–46)
MCH: 26.6 pg (ref 26.0–34.0)
MCHC: 33.5 g/dL (ref 30.0–36.0)
MCV: 79.6 fL (ref 78.0–100.0)
MONOS PCT: 7 % (ref 3–12)
Monocytes Absolute: 0.7 10*3/uL (ref 0.1–1.0)
NEUTROS PCT: 73 % (ref 43–77)
Neutro Abs: 7 10*3/uL (ref 1.7–7.7)
Platelets: 188 10*3/uL (ref 150–400)
RBC: 5.48 MIL/uL — ABNORMAL HIGH (ref 3.87–5.11)
RDW: 14.9 % (ref 11.5–15.5)
WBC: 9.7 10*3/uL (ref 4.0–10.5)

## 2014-09-14 LAB — URINE MICROSCOPIC-ADD ON

## 2014-09-14 LAB — TROPONIN I: Troponin I: <0.03 ng/mL (ref <0.031)

## 2014-09-14 MED ORDER — SODIUM CHLORIDE 0.9 % IV BOLUS (SEPSIS)
1000.0000 mL | Freq: Once | INTRAVENOUS | Status: AC
  Administered 2014-09-14: 1000 mL via INTRAVENOUS

## 2014-09-14 MED ORDER — SODIUM CHLORIDE 0.9 % IV BOLUS (SEPSIS)
500.0000 mL | Freq: Once | INTRAVENOUS | Status: AC
  Administered 2014-09-14: 500 mL via INTRAVENOUS

## 2014-09-14 NOTE — ED Notes (Signed)
Ambulated in hall without difficulty; denied dizziness or weakness.

## 2014-09-14 NOTE — ED Notes (Signed)
Woke up this morning with nausea and generalized weakness. No vomiting. Diarrhea x3. Checked her BP at home 73/36, so she pushed her button to call EMS. 120/88 on EMS arrival. Reports more frequent urination with a strong smell to urine. Patient is blind.

## 2014-09-14 NOTE — ED Notes (Signed)
Patient returned from X-ray 

## 2014-09-14 NOTE — Discharge Instructions (Signed)
Hypotension  As your heart beats, it forces blood through your arteries. This force is your blood pressure. If your blood pressure is too low for you to go about your normal activities or to support the organs of your body, you have hypotension. Hypotension is also referred to as low blood pressure. When your blood pressure becomes too low, you may not get enough blood to your brain. As a result, you may feel weak, feel lightheaded, or develop a rapid heart rate. In a more severe case, you may faint.  CAUSES  Various conditions can cause hypotension. These include:  · Blood loss.  · Dehydration.  · Heart or endocrine problems.  · Pregnancy.  · Severe infection.  · Not having a well-balanced diet filled with needed nutrients.  · Severe allergic reactions (anaphylaxis).  Some medicines, such as blood pressure medicine or water pills (diuretics), may lower your blood pressure below normal. Sometimes taking too much medicine or taking medicine not as directed can cause hypotension.  TREATMENT   Hospitalization is sometimes required for hypotension if fluid or blood replacement is needed, if time is needed for medicines to wear off, or if further monitoring is needed. Treatment might include changing your diet, changing your medicines (including medicines aimed at raising your blood pressure), and use of support stockings.  HOME CARE INSTRUCTIONS   · Drink enough fluids to keep your urine clear or pale yellow.  · Take your medicines as directed by your health care provider.  · Get up slowly from reclining or sitting positions. This gives your blood pressure a chance to adjust.  · Wear support stockings as directed by your health care provider.  · Maintain a healthy diet by including nutritious food, such as fruits, vegetables, nuts, whole grains, and lean meats.  SEEK MEDICAL CARE IF:  · You have vomiting or diarrhea.  · You have a fever for more than 2-3 days.  · You feel more thirsty than usual.  · You feel weak and  tired.  SEEK IMMEDIATE MEDICAL CARE IF:   · You have chest pain or a fast or irregular heartbeat.  · You have a loss of feeling in some part of your body, or you lose movement in your arms or legs.  · You have trouble speaking.  · You become sweaty or feel lightheaded.  · You faint.  MAKE SURE YOU:   · Understand these instructions.  · Will watch your condition.  · Will get help right away if you are not doing well or get worse.  Document Released: 05/02/2005 Document Revised: 02/20/2013 Document Reviewed: 11/02/2012  [employer]® Patient Information ©2015 [employer]. This information is not intended to replace advice given to you by your health care provider. Make sure you discuss any questions you have with your health care provider.

## 2014-09-14 NOTE — ED Notes (Signed)
Pt transported to Xray. 

## 2014-09-14 NOTE — ED Provider Notes (Signed)
CSN: [id number]     Arrival date & time 09/14/14  1011 History   First MD Initiated Contact with Patient 09/14/14 1014     Chief Complaint  Patient presents with  . Weakness     (Consider location/radiation/quality/duration/timing/severity/associated sxs/prior Treatment) Patient is a 54 y.o. female presenting with weakness. The history is provided by the patient.  Weakness Pertinent negatives include no chest pain, no abdominal pain and no shortness of breath.   patient presents after waking up and feeling weak. States she was sweaty. States she had nausea without vomiting. States she was too weak to get out of bed. She waited for age show up. She states that she had some cramping in her abdomen so she took some Lizness. States her blood pressure was 70/40. States she then called 911. States she feels somewhat better now. States she's been doing well. States that she checked her sugar and it was 178 this morning. She has had a mildly decreased appetite. No dysuria.  Past Medical History  Diagnosis Date  . Asthma   . Diabetes mellitus   . Hypertension   . Blind   . Hypothyroidism   . Hyperlipidemia   . Diabetic neuropathy   . Blindness and low vision     left eye glass eye,  legally blind in right eye  . Spinal stenosis    Past Surgical History  Procedure Laterality Date  . Cesarean section      x 2  . Abdominal hysterectomy    . Cholecystectomy    . Enucleation     Family History  Problem Relation Age of Onset  . Diabetes Sister    History  Substance Use Topics  . Smoking status: Former Smoker    Quit date: 05/22/1992  . Smokeless tobacco: Never Used  . Alcohol Use: No   OB History    Obstetric Comments   Pt has two sons.     Review of Systems  Constitutional: Positive for diaphoresis. Negative for activity change.  Respiratory: Negative for shortness of breath.   Cardiovascular: Negative for chest pain.  Gastrointestinal: Positive for nausea. Negative for  abdominal pain.  Genitourinary: Negative for flank pain.  Musculoskeletal: Negative for back pain.  Skin: Negative for pallor.  Neurological: Positive for weakness and light-headedness.      Allergies  Codeine; Iohexol; Penicillins; and Sulfa antibiotics  Home Medications   Prior to Admission medications   Medication Sig Start Date End Date Taking? Authorizing Provider  albuterol (PROVENTIL HFA;VENTOLIN HFA) 108 (90 BASE) MCG/ACT inhaler Inhale 2 puffs into the lungs every 4 (four) hours as needed. For shortness of breath    Historical Provider, MD  benzonatate (TESSALON) 100 MG capsule Take 1 capsule (100 mg total) by mouth 3 (three) times daily. With cough 08/12/14   Geradine Girt, DO  clobetasol cream (TEMOVATE) [id number] % Apply 1 application topically 2 (two) times daily.    Historical Provider, MD  Diclofenac Sodium 1.5 % SOLN Place 1 each onto the skin daily as needed (pain).    Historical Provider, MD  Dulaglutide (TRULICITY [place]) Inject 1.7 mLs into the skin once a week. Takes on Wednesday    Historical Provider, MD  empagliflozin (JARDIANCE) 10 MG TABS tablet Take 10 mg by mouth daily.    Historical Provider, MD  esomeprazole (NEXIUM) 40 MG capsule Take 40 mg by mouth every morning.     Historical Provider, MD  furosemide (LASIX) 20 MG tablet Take 20 mg by mouth at  bedtime.    Historical Provider, MD  HUMALOG KWIKPEN 100 UNIT/ML KiwkPen Inject 5 Units into the skin daily before supper. 12/26/13   Historical Provider, MD  insulin detemir (LEVEMIR) 100 unit/ml SOLN Inject 0.3 mLs (30 Units total) into the skin at bedtime. 08/12/14   Geradine Girt, DO  levothyroxine (SYNTHROID, LEVOTHROID) 137 MCG tablet Take 137 mcg by mouth daily before breakfast.    Historical Provider, MD  lidocaine (XYLOCAINE) 5 % ointment Apply 1 application topically as needed for mild pain.  12/26/13   Historical Provider, MD  Linaclotide Rolan Lipa) 145 MCG CAPS capsule Take 145 mcg by mouth every morning.     Historical Provider, MD  midodrine (PROAMATINE) 5 MG tablet Take 5 mg by mouth 3 (three) times daily with meals.     Historical Provider, MD  montelukast (SINGULAIR) 10 MG tablet Take 10 mg by mouth at bedtime.    Historical Provider, MD  naproxen sodium (ANAPROX) 220 MG tablet Take 220 mg by mouth 2 (two) times daily as needed (pain).    Historical Provider, MD  oseltamivir (TAMIFLU) 75 MG capsule Take 1 capsule (75 mg total) by mouth 2 (two) times daily. 08/12/14   Geradine Girt, DO  polyethylene glycol powder (GLYCOLAX/MIRALAX) powder Take 17 g by mouth daily. 12/16/13   Historical Provider, MD  potassium chloride SA (K-DUR,KLOR-CON) 20 MEQ tablet Take 20 mEq by mouth every morning.    Historical Provider, MD  pregabalin (LYRICA) 100 MG capsule Take 100 mg by mouth 3 (three) times daily.    Historical Provider, MD  simvastatin (ZOCOR) 20 MG tablet Take 20 mg by mouth at bedtime.     Historical Provider, MD   BP 116/82 mmHg  Pulse 83  Temp(Src) 98.6 F (37 C) (Oral)  Resp 25  Ht 4\' 11"  (1.499 m)  Wt 170 lb (77.111 kg)  BMI 34.32 kg/m2  SpO2 100% Physical Exam  Constitutional: She is oriented to person, place, and time. She appears well-developed and well-nourished.  HENT:  Head: Normocephalic and atraumatic.  Eyes:  Blind in right eye and false I on left.  Neck: Normal range of motion. Neck supple.  Cardiovascular: Normal rate, regular rhythm and normal heart sounds.   No murmur heard. Pulmonary/Chest: Effort normal and breath sounds normal. No respiratory distress. She has no wheezes. She has no rales.  Abdominal: Soft. Bowel sounds are normal. She exhibits no distension. There is no tenderness. There is no rebound and no guarding.  Musculoskeletal: Normal range of motion.  Neurological: She is alert and oriented to person, place, and time. No cranial nerve deficit.  Skin: Skin is warm and dry.  Psychiatric: She has a normal mood and affect. Her speech is normal.  Nursing note and  vitals reviewed.   ED Course  Procedures (including critical care time) Labs Review Labs Reviewed  COMPREHENSIVE METABOLIC PANEL  TROPONIN I  URINALYSIS, ROUTINE W REFLEX MICROSCOPIC  CBC WITH DIFFERENTIAL/PLATELET    Imaging Review Dg Chest 2 View  09/14/2014   CLINICAL DATA:  Nausea and weakness ; hypertension  EXAM: CHEST  2 VIEW  COMPARISON:  August 08, 2014  FINDINGS: Lungs are clear. Heart size and pulmonary vascularity are normal. No adenopathy. There is mild degenerative change in the thoracic spine.  IMPRESSION: No edema or consolidation.   Electronically Signed   By: Lowella Grip III M.D.   On: 09/14/2014 14:08     EKG Interpretation   Date/Time:  Sunday Sep 14 2014 10:20:19  EDT Ventricular Rate:  76 PR Interval:  126 QRS Duration: 76 QT Interval:  401 QTC Calculation: 451 R Axis:   39 Text Interpretation:  Sinus rhythm Abnormal R-wave progression, early  transition Nonspecific T abnormalities, lateral leads Baseline wander in  lead(s) I III aVR aVL aVF Confirmed by Alvino Chapel  MD, Ovid Curd [id number]) on  09/14/2014 10:43:51 AM      MDM   Final diagnoses:  Orthostatic hypotension  Hyperglycemia    Patient with hypertension. Orthostatic on arrival. Feels better after IV fluids. No further orthostasis. Mild hyperglycemia. Will discharge home.    Davonna Belling, MD 09/14/14 [phone]

## 2014-09-14 NOTE — ED Notes (Signed)
Pt is in stable condition upon d/c and is escorted from ED by staff via wheelchair.

## 2014-09-19 ENCOUNTER — Other Ambulatory Visit: Payer: Self-pay | Admitting: Internal Medicine

## 2014-10-22 ENCOUNTER — Ambulatory Visit (INDEPENDENT_AMBULATORY_CARE_PROVIDER_SITE_OTHER): Payer: Medicare Other | Admitting: Podiatry

## 2014-10-22 ENCOUNTER — Encounter: Payer: Self-pay | Admitting: Podiatry

## 2014-10-22 DIAGNOSIS — B351 Tinea unguium: Secondary | ICD-10-CM

## 2014-10-22 DIAGNOSIS — M722 Plantar fascial fibromatosis: Secondary | ICD-10-CM

## 2014-10-22 DIAGNOSIS — E1140 Type 2 diabetes mellitus with diabetic neuropathy, unspecified: Secondary | ICD-10-CM

## 2014-10-22 NOTE — Patient Instructions (Signed)
Will obtain certification from [facility] for diabetic shoes Diabetes and Foot Care Diabetes may cause you to have problems because of poor blood supply (circulation) to your feet and legs. This may cause the skin on your feet to become thinner, break easier, and heal more slowly. Your skin may become dry, and the skin may peel and crack. You may also have nerve damage in your legs and feet causing decreased feeling in them. You may not notice minor injuries to your feet that could lead to infections or more serious problems. Taking care of your feet is one of the most important things you can do for yourself.  HOME CARE INSTRUCTIONS  Wear shoes at all times, even in the house. Do not go barefoot. Bare feet are easily injured.  Check your feet daily for blisters, cuts, and redness. If you cannot see the bottom of your feet, use a mirror or ask someone for help.  Wash your feet with warm water (do not use hot water) and mild soap. Then pat your feet and the areas between your toes until they are completely dry. Do not soak your feet as this can dry your skin.  Apply a moisturizing lotion or petroleum jelly (that does not contain alcohol and is unscented) to the skin on your feet and to dry, brittle toenails. Do not apply lotion between your toes.  Trim your toenails straight across. Do not dig under them or around the cuticle. File the edges of your nails with an emery board or nail file.  Do not cut corns or calluses or try to remove them with medicine.  Wear clean socks or stockings every day. Make sure they are not too tight. Do not wear knee-high stockings since they may decrease blood flow to your legs.  Wear shoes that fit properly and have enough cushioning. To break in new shoes, wear them for just a few hours a day. This prevents you from injuring your feet. Always look in your shoes before you put them on to be sure there are no objects inside.  Do not cross your legs. This may decrease  the blood flow to your feet.  If you find a minor scrape, cut, or break in the skin on your feet, keep it and the skin around it clean and dry. These areas may be cleansed with mild soap and water. Do not cleanse the area with peroxide, alcohol, or iodine.  When you remove an adhesive bandage, be sure not to damage the skin around it.  If you have a wound, look at it several times a day to make sure it is healing.  Do not use heating pads or hot water bottles. They may burn your skin. If you have lost feeling in your feet or legs, you may not know it is happening until it is too late.  Make sure your health care provider performs a complete foot exam at least annually or more often if you have foot problems. Report any cuts, sores, or bruises to your health care provider immediately. SEEK MEDICAL CARE IF:   You have an injury that is not healing.  You have cuts or breaks in the skin.  You have an ingrown nail.  You notice redness on your legs or feet.  You feel burning or tingling in your legs or feet.  You have pain or cramps in your legs and feet.  Your legs or feet are numb.  Your feet always feel cold. [place]  CARE IF:   There is increasing redness, swelling, or pain in or around a wound.  There is a red line that goes up your leg.  Pus is coming from a wound.  You develop a fever or as directed by your health care provider.  You notice a bad smell coming from an ulcer or wound. Document Released: 04/29/2000 Document Revised: 01/02/2013 Document Reviewed: 10/09/2012 [facility] Patient Information 2015 [place], [place]. This information is not intended to replace advice given to you by your health care provider. Make sure you discuss any questions you have with your health care provider.

## 2014-10-23 NOTE — Progress Notes (Signed)
Patient ID: Amy [name], female   DOB: 06/16/1960, 54 y.o.   MRN: [name]  Subjective: Patient presents today requesting nail debridement. She also request replacement diabetic shoes.  Visual impairment associated with diabetes  Objective: Orientated 3  Vascular: DP and PT pulses 2/4 bilaterally Capillary reflex immediate bilaterally  Neurological: Sensation to 10 g monofilament wire intact 3/5 right and 4/5 left Vibratory sensation diminished bilaterally Ankle reflexes reactive bilaterally  Dermatological: The toenails elongated thick and discolored 6-10 Texture and turgor adequate without any open skin lesions bilaterally Palpable tenderness millimeter thickening in medial fascial band mid foot left not adherent to the overlying skin  Musculoskeletal: Hallux interphalangeus noted bilaterally  Assessment: Plantar fibromatosis mid arch left ( new finding) 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 Durward Fortes

## 2014-11-01 ENCOUNTER — Inpatient Hospital Stay (HOSPITAL_COMMUNITY)
Admission: EM | Admit: 2014-11-01 | Discharge: 2014-11-03 | DRG: 641 | Disposition: A | Discharge status: Skilled Nursing Facility | Payer: Medicare Other | Attending: Internal Medicine | Admitting: Internal Medicine

## 2014-11-01 ENCOUNTER — Emergency Department (HOSPITAL_COMMUNITY): Payer: Medicare Other

## 2014-11-01 ENCOUNTER — Encounter (HOSPITAL_COMMUNITY): Payer: Self-pay | Admitting: Emergency Medicine

## 2014-11-01 DIAGNOSIS — Z7982 Long term (current) use of aspirin: Secondary | ICD-10-CM | POA: Diagnosis not present

## 2014-11-01 DIAGNOSIS — J45909 Unspecified asthma, uncomplicated: Secondary | ICD-10-CM | POA: Diagnosis present

## 2014-11-01 DIAGNOSIS — I10 Essential (primary) hypertension: Secondary | ICD-10-CM | POA: Diagnosis present

## 2014-11-01 DIAGNOSIS — E875 Hyperkalemia: Principal | ICD-10-CM | POA: Diagnosis present

## 2014-11-01 DIAGNOSIS — E1165 Type 2 diabetes mellitus with hyperglycemia: Secondary | ICD-10-CM | POA: Diagnosis present

## 2014-11-01 DIAGNOSIS — Z79899 Other long term (current) drug therapy: Secondary | ICD-10-CM | POA: Diagnosis not present

## 2014-11-01 DIAGNOSIS — H540 Blindness, both eyes: Secondary | ICD-10-CM | POA: Diagnosis not present

## 2014-11-01 DIAGNOSIS — K5900 Constipation, unspecified: Secondary | ICD-10-CM | POA: Diagnosis present

## 2014-11-01 DIAGNOSIS — H547 Unspecified visual loss: Secondary | ICD-10-CM | POA: Diagnosis present

## 2014-11-01 DIAGNOSIS — H548 Legal blindness, as defined in USA: Secondary | ICD-10-CM | POA: Diagnosis present

## 2014-11-01 DIAGNOSIS — E785 Hyperlipidemia, unspecified: Secondary | ICD-10-CM | POA: Diagnosis present

## 2014-11-01 DIAGNOSIS — I959 Hypotension, unspecified: Secondary | ICD-10-CM | POA: Diagnosis present

## 2014-11-01 DIAGNOSIS — E1139 Type 2 diabetes mellitus with other diabetic ophthalmic complication: Secondary | ICD-10-CM | POA: Diagnosis present

## 2014-11-01 DIAGNOSIS — R109 Unspecified abdominal pain: Secondary | ICD-10-CM | POA: Diagnosis present

## 2014-11-01 DIAGNOSIS — E039 Hypothyroidism, unspecified: Secondary | ICD-10-CM | POA: Diagnosis present

## 2014-11-01 DIAGNOSIS — Z87891 Personal history of nicotine dependence: Secondary | ICD-10-CM | POA: Diagnosis not present

## 2014-11-01 DIAGNOSIS — E119 Type 2 diabetes mellitus without complications: Secondary | ICD-10-CM

## 2014-11-01 DIAGNOSIS — E1140 Type 2 diabetes mellitus with diabetic neuropathy, unspecified: Secondary | ICD-10-CM | POA: Diagnosis present

## 2014-11-01 DIAGNOSIS — Z794 Long term (current) use of insulin: Secondary | ICD-10-CM | POA: Diagnosis not present

## 2014-11-01 LAB — COMPREHENSIVE METABOLIC PANEL
ALK PHOS: 104 U/L (ref 38–126)
ALT: 13 U/L — ABNORMAL LOW (ref 14–54)
AST: 17 U/L (ref 15–41)
Albumin: 3.1 g/dL — ABNORMAL LOW (ref 3.5–5.0)
Anion gap: 4 — ABNORMAL LOW (ref 5–15)
BUN: 12 mg/dL (ref 6–20)
CALCIUM: 9 mg/dL (ref 8.9–10.3)
CO2: 25 mmol/L (ref 22–32)
Chloride: 109 mmol/L (ref 101–111)
Creatinine, Ser: 0.84 mg/dL (ref 0.44–1.00)
GLUCOSE: 350 mg/dL — AB (ref 65–99)
POTASSIUM: 7.5 mmol/L — AB (ref 3.5–5.1)
Sodium: 138 mmol/L (ref 135–145)
Total Bilirubin: 0.5 mg/dL (ref 0.3–1.2)
Total Protein: 7 g/dL (ref 6.5–8.1)

## 2014-11-01 LAB — CBC WITH DIFFERENTIAL/PLATELET
Basophils Absolute: 0 10*3/uL (ref 0.0–0.1)
Basophils Relative: 0 % (ref 0–1)
EOS PCT: 1 % (ref 0–5)
Eosinophils Absolute: 0.1 10*3/uL (ref 0.0–0.7)
HEMATOCRIT: 39.7 % (ref 36.0–46.0)
Hemoglobin: 13.4 g/dL (ref 12.0–15.0)
LYMPHS ABS: 2.1 10*3/uL (ref 0.7–4.0)
Lymphocytes Relative: 24 % (ref 12–46)
MCH: 26.3 pg (ref 26.0–34.0)
MCHC: 33.8 g/dL (ref 30.0–36.0)
MCV: 77.8 fL — ABNORMAL LOW (ref 78.0–100.0)
Monocytes Absolute: 0.7 10*3/uL (ref 0.1–1.0)
Monocytes Relative: 8 % (ref 3–12)
NEUTROS ABS: 6 10*3/uL (ref 1.7–7.7)
Neutrophils Relative %: 67 % (ref 43–77)
PLATELETS: 195 10*3/uL (ref 150–400)
RBC: 5.1 MIL/uL (ref 3.87–5.11)
RDW: 15.1 % (ref 11.5–15.5)
WBC: 8.9 10*3/uL (ref 4.0–10.5)

## 2014-11-01 LAB — GLUCOSE, CAPILLARY
GLUCOSE-CAPILLARY: 240 mg/dL — AB (ref 65–99)
Glucose-Capillary: 228 mg/dL — ABNORMAL HIGH (ref 65–99)

## 2014-11-01 LAB — URINALYSIS, ROUTINE W REFLEX MICROSCOPIC
Bilirubin Urine: NEGATIVE
Glucose, UA: 500 mg/dL — AB
Hgb urine dipstick: NEGATIVE
Ketones, ur: NEGATIVE mg/dL
LEUKOCYTES UA: NEGATIVE
Nitrite: NEGATIVE
PROTEIN: 100 mg/dL — AB
Specific Gravity, Urine: 1.012 (ref 1.005–1.030)
Urobilinogen, UA: 0.2 mg/dL (ref 0.0–1.0)
pH: 8 (ref 5.0–8.0)

## 2014-11-01 LAB — BASIC METABOLIC PANEL
Anion gap: 9 (ref 5–15)
BUN: 11 mg/dL (ref 6–20)
CO2: 25 mmol/L (ref 22–32)
CREATININE: 0.71 mg/dL (ref 0.44–1.00)
Calcium: 10.1 mg/dL (ref 8.9–10.3)
Chloride: 108 mmol/L (ref 101–111)
GFR calc Af Amer: >60 mL/min (ref >60)
Glucose, Bld: 144 mg/dL — ABNORMAL HIGH (ref 65–99)
POTASSIUM: 5.1 mmol/L (ref 3.5–5.1)
Sodium: 142 mmol/L (ref 135–145)

## 2014-11-01 LAB — I-STAT CHEM 8, ED
BUN: 17 mg/dL (ref 6–20)
Calcium, Ion: 1.22 mmol/L (ref 1.12–1.23)
Chloride: 107 mmol/L (ref 101–111)
Creatinine, Ser: 0.9 mg/dL (ref 0.44–1.00)
GLUCOSE: 334 mg/dL — AB (ref 65–99)
HEMATOCRIT: 46 % (ref 36.0–46.0)
HEMOGLOBIN: 15.6 g/dL — AB (ref 12.0–15.0)
Potassium: 7.5 mmol/L (ref 3.5–5.1)
Sodium: 140 mmol/L (ref 135–145)
TCO2: 25 mmol/L (ref 0–100)

## 2014-11-01 LAB — URINE MICROSCOPIC-ADD ON

## 2014-11-01 LAB — CBG MONITORING, ED
GLUCOSE-CAPILLARY: 279 mg/dL — AB (ref 65–99)
Glucose-Capillary: 348 mg/dL — ABNORMAL HIGH (ref 65–99)
Glucose-Capillary: 74 mg/dL (ref 65–99)

## 2014-11-01 LAB — I-STAT TROPONIN, ED: TROPONIN I, POC: 0 ng/mL (ref 0.00–0.08)

## 2014-11-01 MED ORDER — LINACLOTIDE 145 MCG PO CAPS
145.0000 ug | ORAL_CAPSULE | Freq: Every morning | ORAL | Status: DC
  Administered 2014-11-02: 145 ug via ORAL
  Filled 2014-11-01 (×2): qty 1

## 2014-11-01 MED ORDER — INSULIN DETEMIR 100 UNIT/ML FLEXPEN
30.0000 [IU] | Freq: Every day | SUBCUTANEOUS | Status: DC
  Filled 2014-11-01: qty 3

## 2014-11-01 MED ORDER — ASPIRIN EC 81 MG PO TBEC
81.0000 mg | DELAYED_RELEASE_TABLET | Freq: Every day | ORAL | Status: DC
  Administered 2014-11-02 – 2014-11-03 (×2): 81 mg via ORAL
  Filled 2014-11-01 (×2): qty 1

## 2014-11-01 MED ORDER — ENOXAPARIN SODIUM 40 MG/0.4ML ~~LOC~~ SOLN
40.0000 mg | SUBCUTANEOUS | Status: DC
  Administered 2014-11-01 – 2014-11-03 (×3): 40 mg via SUBCUTANEOUS
  Filled 2014-11-01 (×3): qty 0.4

## 2014-11-01 MED ORDER — ALBUTEROL SULFATE HFA 108 (90 BASE) MCG/ACT IN AERS: 2.0000 | INHALATION_SPRAY | RESPIRATORY_TRACT | Status: DC | PRN

## 2014-11-01 MED ORDER — PREGABALIN 75 MG PO CAPS
150.0000 mg | ORAL_CAPSULE | Freq: Three times a day (TID) | ORAL | Status: DC
  Administered 2014-11-01 – 2014-11-03 (×7): 150 mg via ORAL
  Filled 2014-11-01 (×7): qty 2

## 2014-11-01 MED ORDER — INSULIN ASPART 100 UNIT/ML ~~LOC~~ SOLN
0.0000 [IU] | Freq: Every day | SUBCUTANEOUS | Status: DC
  Administered 2014-11-01: 2 [IU] via SUBCUTANEOUS
  Administered 2014-11-02: 5 [IU] via SUBCUTANEOUS

## 2014-11-01 MED ORDER — FLEET ENEMA 7-19 GM/118ML RE ENEM
1.0000 | ENEMA | Freq: Once | RECTAL | Status: AC | PRN
  Filled 2014-11-01: qty 1

## 2014-11-01 MED ORDER — SODIUM CHLORIDE 0.9 % IV BOLUS (SEPSIS)
1000.0000 mL | Freq: Once | INTRAVENOUS | Status: AC
  Administered 2014-11-01: 1000 mL via INTRAVENOUS

## 2014-11-01 MED ORDER — SODIUM POLYSTYRENE SULFONATE 15 GM/60ML PO SUSP
30.0000 g | Freq: Once | ORAL | Status: AC
  Administered 2014-11-01: 30 g via ORAL
  Filled 2014-11-01: qty 120

## 2014-11-01 MED ORDER — INSULIN ASPART 100 UNIT/ML ~~LOC~~ SOLN
10.0000 [IU] | Freq: Once | SUBCUTANEOUS | Status: AC
  Administered 2014-11-01: 10 [IU] via INTRAVENOUS
  Filled 2014-11-01: qty 1

## 2014-11-01 MED ORDER — ALBUTEROL SULFATE (2.5 MG/3ML) 0.083% IN NEBU: 2.5000 mg | INHALATION_SOLUTION | RESPIRATORY_TRACT | Status: DC | PRN

## 2014-11-01 MED ORDER — INSULIN DETEMIR 100 UNIT/ML ~~LOC~~ SOLN
30.0000 [IU] | Freq: Every day | SUBCUTANEOUS | Status: DC
  Administered 2014-11-01 – 2014-11-02 (×2): 30 [IU] via SUBCUTANEOUS
  Filled 2014-11-01 (×3): qty 0.3

## 2014-11-01 MED ORDER — MONTELUKAST SODIUM 10 MG PO TABS
10.0000 mg | ORAL_TABLET | Freq: Every day | ORAL | Status: DC
  Administered 2014-11-01 – 2014-11-02 (×2): 10 mg via ORAL
  Filled 2014-11-01 (×3): qty 1

## 2014-11-01 MED ORDER — POLYETHYLENE GLYCOL 3350 17 G PO PACK
17.0000 g | PACK | Freq: Two times a day (BID) | ORAL | Status: DC
  Administered 2014-11-02: 17 g via ORAL
  Filled 2014-11-01 (×5): qty 1

## 2014-11-01 MED ORDER — SODIUM CHLORIDE 0.9 % IV SOLN
1.0000 g | Freq: Once | INTRAVENOUS | Status: AC
  Administered 2014-11-01: 1 g via INTRAVENOUS
  Filled 2014-11-01 (×2): qty 10

## 2014-11-01 MED ORDER — POLYETHYLENE GLYCOL 3350 17 GM/SCOOP PO POWD
17.0000 g | Freq: Two times a day (BID) | ORAL | Status: DC
  Filled 2014-11-01: qty 255

## 2014-11-01 MED ORDER — MIDODRINE HCL 5 MG PO TABS
5.0000 mg | ORAL_TABLET | Freq: Three times a day (TID) | ORAL | Status: DC
  Administered 2014-11-01 – 2014-11-03 (×6): 5 mg via ORAL
  Filled 2014-11-01 (×9): qty 1

## 2014-11-01 MED ORDER — SODIUM BICARBONATE 8.4 % IV SOLN
50.0000 meq | Freq: Once | INTRAVENOUS | Status: AC
  Administered 2014-11-01: 50 meq via INTRAVENOUS
  Filled 2014-11-01: qty 50

## 2014-11-01 MED ORDER — PANTOPRAZOLE SODIUM 40 MG PO TBEC
40.0000 mg | DELAYED_RELEASE_TABLET | Freq: Every day | ORAL | Status: DC
  Administered 2014-11-01 – 2014-11-03 (×3): 40 mg via ORAL
  Filled 2014-11-01 (×3): qty 1

## 2014-11-01 MED ORDER — INSULIN ASPART 100 UNIT/ML ~~LOC~~ SOLN
0.0000 [IU] | Freq: Three times a day (TID) | SUBCUTANEOUS | Status: DC
  Administered 2014-11-01: 8 [IU] via SUBCUTANEOUS
  Administered 2014-11-02: 5 [IU] via SUBCUTANEOUS
  Administered 2014-11-02 – 2014-11-03 (×2): 3 [IU] via SUBCUTANEOUS

## 2014-11-01 MED ORDER — LEVOTHYROXINE SODIUM 150 MCG PO TABS
150.0000 ug | ORAL_TABLET | Freq: Every day | ORAL | Status: DC
  Administered 2014-11-02 – 2014-11-03 (×2): 150 ug via ORAL
  Filled 2014-11-01 (×4): qty 1

## 2014-11-01 MED ORDER — SIMVASTATIN 20 MG PO TABS
20.0000 mg | ORAL_TABLET | Freq: Every day | ORAL | Status: DC
  Administered 2014-11-01 – 2014-11-02 (×2): 20 mg via ORAL
  Filled 2014-11-01 (×3): qty 1

## 2014-11-01 NOTE — Progress Notes (Signed)
Report received from [facility] for admission to [phone]

## 2014-11-01 NOTE — ED Notes (Signed)
Per EMS, pt coming in for multiple complaints including SOB, hyperglycemia, leg cramping this morning and difficulty ambulating this morning. Pts BP was 220/130 for EMS. Pt was hyperventilating when EMS arrived but SpO2 was 100%. Pt alert x4 at this time.

## 2014-11-01 NOTE — ED Provider Notes (Signed)
CSN: [id number]     Arrival date & time 11/01/14  1043 History   First MD Initiated Contact with Patient 11/01/14 1050     Chief Complaint  Patient presents with  . Hyperglycemia  . Shortness of Breath  . Chest Pain  . Back Pain     (Consider location/radiation/quality/duration/timing/severity/associated sxs/prior Treatment) HPI Comments: Patient presents today with multiple complaints.  She is complaining of lower extremity cramping, hyperglycemia, mild SOB, and constipation.  She states that last evening she felt cramping of both legs.  She states that she has had similar symptoms in the past when her Potassium has been low.  Therefore, she took an additional dose of her Potassium last evening.  She normally takes daily Potassium in the morning.  She states that she is legally blind.  A home health nurse comes into her home every 2 weeks and crushes up her Potassium.  She then gives herself a tsp daily, but is unsure of the exact amount of Potassium that she is getting.  She states that she also felt generalized weakness over the past couple of days, but no focal weakness.  She also states that this morning she felt clammy and suspected that her blood sugar was low.  She tried taking her blood sugar at home, but was unable to because she could not see her monitor.  Therefore, she called EMS to see if they could take her blood sugar.  She states that when EMS arrived her blood pressure was very elevated and she also had an elevated blood sugar.  She reports that she is taking DM medications as prescribed.  She denies nausea, vomiting, or abdominal pain.  She also states that she was feeling SOB when EMS arrived, but this has improved at this time.  She is also complaining of constipation.  She states that her last BM was 4 days ago and that she had to strain with the BM.  She is unsure if she had any blood in her stool because she is legally blind.  She denies any abdominal pain or distension at this  time.    The history is provided by the patient.    Past Medical History  Diagnosis Date  . Asthma   . Diabetes mellitus   . Hypertension   . Blind   . Hypothyroidism   . Hyperlipidemia   . Diabetic neuropathy   . Blindness and low vision     left eye glass eye,  legally blind in right eye  . Spinal stenosis    Past Surgical History  Procedure Laterality Date  . Cesarean section      x 2  . Abdominal hysterectomy    . Cholecystectomy    . Enucleation     Family History  Problem Relation Age of Onset  . Diabetes Sister    History  Substance Use Topics  . Smoking status: Former Smoker    Quit date: 05/22/1992  . Smokeless tobacco: Never Used  . Alcohol Use: No   OB History    Obstetric Comments   Pt has two sons.     Review of Systems  All other systems reviewed and are negative.     Allergies  Codeine; Iohexol; Penicillins; and Sulfa antibiotics  Home Medications   Prior to Admission medications   Medication Sig Start Date End Date Taking? Authorizing Provider  albuterol (PROVENTIL HFA;VENTOLIN HFA) 108 (90 BASE) MCG/ACT inhaler Inhale 2 puffs into the lungs every 4 (four) hours as  needed. For shortness of breath    Historical Provider, MD  benzonatate (TESSALON) 100 MG capsule Take 1 capsule (100 mg total) by mouth 3 (three) times daily. With cough 08/12/14   Geradine Girt, DO  clobetasol cream (TEMOVATE) AB-[id number] % Apply 1 application topically 2 (two) times daily.    Historical Provider, MD  Diclofenac Sodium 1.5 % SOLN Place 1 each onto the skin daily as needed (pain).    Historical Provider, MD  Dulaglutide (TRULICITY [place]) Inject 1.7 mLs into the skin once a week. Takes on Wednesday    Historical Provider, MD  empagliflozin (JARDIANCE) 10 MG TABS tablet Take 10 mg by mouth daily.    Historical Provider, MD  esomeprazole (NEXIUM) 40 MG capsule Take 40 mg by mouth every morning.     Historical Provider, MD  furosemide (LASIX) 20 MG tablet Take 20 mg by mouth  at bedtime.    Historical Provider, MD  HUMALOG KWIKPEN 100 UNIT/ML KiwkPen Inject 5 Units into the skin daily before supper. 12/26/13   Historical Provider, MD  insulin detemir (LEVEMIR) 100 unit/ml SOLN Inject 0.3 mLs (30 Units total) into the skin at bedtime. 08/12/14   Geradine Girt, DO  levothyroxine (SYNTHROID, LEVOTHROID) 137 MCG tablet Take 137 mcg by mouth daily before breakfast.    Historical Provider, MD  lidocaine (XYLOCAINE) 5 % ointment Apply 1 application topically as needed for mild pain.  12/26/13   Historical Provider, MD  Linaclotide Rolan Lipa) 145 MCG CAPS capsule Take 145 mcg by mouth every morning.    Historical Provider, MD  midodrine (PROAMATINE) 5 MG tablet Take 5 mg by mouth 3 (three) times daily with meals.     Historical Provider, MD  montelukast (SINGULAIR) 10 MG tablet Take 10 mg by mouth at bedtime.    Historical Provider, MD  naproxen sodium (ANAPROX) 220 MG tablet Take 220 mg by mouth 2 (two) times daily as needed (pain).    Historical Provider, MD  oseltamivir (TAMIFLU) 75 MG capsule Take 1 capsule (75 mg total) by mouth 2 (two) times daily. 08/12/14   Geradine Girt, DO  polyethylene glycol powder (GLYCOLAX/MIRALAX) powder Take 17 g by mouth daily. 12/16/13   Historical Provider, MD  potassium chloride SA (K-DUR,KLOR-CON) 20 MEQ tablet Take 20 mEq by mouth every morning.    Historical Provider, MD  pregabalin (LYRICA) 100 MG capsule Take 100 mg by mouth 3 (three) times daily.    Historical Provider, MD  simvastatin (ZOCOR) 20 MG tablet Take 20 mg by mouth at bedtime.     Historical Provider, MD   BP 182/84 mmHg  Temp(Src) 98 F (36.7 C) (Oral)  Resp 18  SpO2 100% Physical Exam  Constitutional: She appears well-developed and well-nourished.  HENT:  Head: Normocephalic and atraumatic.  Mouth/Throat: Oropharynx is clear and moist.  Neck: Normal range of motion. Neck supple.  Cardiovascular: Normal rate, regular rhythm and normal heart sounds.   Pulmonary/Chest:  Effort normal and breath sounds normal.  Abdominal: Soft. Bowel sounds are normal. She exhibits no distension and no mass. There is no tenderness. There is no rebound and no guarding.  Musculoskeletal: Normal range of motion.  Neurological: She is alert. She has normal strength. No cranial nerve deficit or sensory deficit.  Skin: Skin is warm and dry.  Psychiatric: She has a normal mood and affect.  Nursing note and vitals reviewed.   ED Course  Procedures (including critical care time) Labs Review Labs Reviewed  CBG MONITORING, ED -  Abnormal; Notable for the following:    Glucose-Capillary 348 (*)    All other components within normal limits  CBC WITH DIFFERENTIAL/PLATELET  COMPREHENSIVE METABOLIC PANEL  URINALYSIS, ROUTINE W REFLEX MICROSCOPIC (NOT AT [facility]  Randolm Idol, ED    Imaging Review Dg Abd Acute W/chest  11/01/2014   CLINICAL DATA:  Shortness of breath and diaphoresis. Hyperglycemia. Chest pain.  EXAM: DG ABDOMEN ACUTE W/ 1V CHEST  COMPARISON:  09/14/2014 and abdominal CT 08/13/2011  FINDINGS: Both lungs are clear. Normal appearance of the heart and mediastinum. The trachea is midline. Negative for free air. Cholecystectomy clips in the right upper quadrant of the abdomen. There is a nonspecific bowel gas pattern. Moderate amount of stool. Again noted are sclerotic foci in the iliac wings suggestive for bone islands.  IMPRESSION: Negative abdominal radiographs.  No acute cardiopulmonary disease.   Electronically Signed   By: Markus Daft M.D.   On: 11/01/2014 12:38     EKG Interpretation   Date/Time:  Saturday November 01 2014 10:46:10 EDT Ventricular Rate:  84 PR Interval:  149 QRS Duration: 72 QT Interval:  358 QTC Calculation: 423 R Axis:   48 Text Interpretation:  Sinus rhythm Low voltage, precordial leads Probable  anteroseptal infarct, old hyperacute T waves, new since prior ecg  Confirmed by CAMPOS  MD, Lennette Bihari ([id number]) on 11/01/2014 1:09:44 PM      MDM    Final diagnoses:  Hyperkalemia    Patient presents today with multiple non specific complaints.  She is currently on Potassium supplementation and reports taking an extra dose last evening.  She states that she usually takes a tsp of Potassium daily, but she is legally blind and the medication is crushed up so she is unsure of the exact dose.  Labs today showing significant hyperkalemia with a Potassium of 7.5.  EKG showing peaked t waves.  Potassium reordered to verify, which also showed a K of 7.5.  Patient given Calcium Chloride IV, Sodium Bicarbonate, Insulin, and Kayexalate in the ED.  Patient with no acute findings on CXR.  No signs of bowel obstruction on acute abdominal series.  Patient admitted to [facility] for further management of Hyperkalemia.      Hyman Bible, PA-C 11/01/14 [place], MD 11/02/14 [phone]

## 2014-11-01 NOTE — ED Notes (Signed)
Second RN to attempt iv x2 with no success

## 2014-11-01 NOTE — ED Notes (Signed)
Pt at xray

## 2014-11-01 NOTE — Progress Notes (Signed)
Amy [name] is a 54 y.o. female patient admitted from ED awake, alert - oriented  X 3 - no acute distress noted.  VSS - Blood pressure 157/78, pulse 76, temperature 98.5 F (36.9 C), temperature source Oral, resp. rate 16, SpO2 98 %.    IV in place, occlusive dsg intact without redness.  Orientation to room, and floor completed with information packet given to patient/family.  Patient declined safety video at this time.  Admission INP armband ID verified with patient/family, and in place.   SR up x 2, fall assessment complete, with patient and family able to verbalize understanding of risk associated with falls, and verbalized understanding to call nsg before up out of bed.  Touch Call light within reach, patient able to voice, and demonstrate understanding.  Skin, clean-dry- intact without evidence of bruising, or skin tears.   No evidence of skin break down noted on exam.      Will cont to eval and treat per MD orders.  Janalyn Shy, RN 11/01/2014 3:46 PM

## 2014-11-01 NOTE — H&P (Signed)
[facility] History and Physical  Amy [name] [id number] DOB: 07-12-1960 DOA: 11/01/2014  Referring physician: EDP PCP: Velna Hatchet, MD   Chief Complaint: dont feel right, cramping  HPI: Amy [name] is a 54 y.o. female who is legally blind, has type 2 diabetes, hypertension, hypothyroidism, asthma, dyslipidemia, who lives at home by herself presented to the ER today with numerous nonspecific symptoms. She reports trouble with Constipation for the Last 4 Days, in addition she also noticed some cramping in her feet especially her toes and took some extra potassium. Since she is legally blind her nurse crushes her potassium and she takes about half a teaspoon at this on a daily basis as her regular home medication. Was started on potassium along with Lasix long time ago however her Lasix was changed to when necessary and she takes Lasix very rarely but continued taking scheduled potassium every day. Last night she took 1-1/2 teaspoon of crushed potassium (she has no idea how much potassium she is actually taking as it is all crushed in a bottle).  This morning she started having muscle twitching to sweating and strain sensations around her mouth along with cramping and just didn't feel right and subsequently came to the ER. In the ER and noted to have severe hypokalemia with K of 7.5 which was repeated and verified   Review of Systems: Positives bolded Constitutional: Cramping, diaphoresis No weight loss, night sweats, Fevers, chills, fatigue.  HEENT:  No headaches, Difficulty swallowing,Tooth/dental problems,Sore throat,  No sneezing, itching, ear ache, nasal congestion, post nasal drip,  Cardio-vascular:  No chest pain, Orthopnea, PND, swelling in lower extremities, anasarca, dizziness, palpitations  GI:  No heartburn, indigestion, abdominal pain, nausea, vomiting, diarrhea, change in bowel habits, loss of appetite  Resp:  No shortness of breath with exertion or at  rest. No excess mucus, no productive cough, No non-productive cough, No coughing up of blood.No change in color of mucus.No wheezing.No chest wall deformity  Skin:  no rash or lesions.  GU:  no dysuria, change in color of urine, no urgency or frequency. No flank pain.  Musculoskeletal:  No joint pain or swelling. No decreased range of motion. No back pain.  Psych:  No change in mood or affect. No depression or anxiety. No memory loss.   Past Medical History  Diagnosis Date  . Asthma   . Diabetes mellitus   . Hypertension   . Blind   . Hypothyroidism   . Hyperlipidemia   . Diabetic neuropathy   . Blindness and low vision     left eye glass eye,  legally blind in right eye  . Spinal stenosis    Past Surgical History  Procedure Laterality Date  . Cesarean section      x 2  . Abdominal hysterectomy    . Cholecystectomy    . Enucleation     Social History:  reports that she quit smoking about 22 years ago. She has never used smokeless tobacco. She reports that she does not drink alcohol or use illicit drugs.  Allergies  Allergen Reactions  . Codeine Nausea Only  . Iohexol      Code: HIVES, Desc: pt developed itching and hives along with nasal congestion; needs 13 hour premeds for future studies, Onset Date: [id number]   . Penicillins Hives  . Sulfa Antibiotics Nausea And Vomiting    Family History  Problem Relation Age of Onset  . Diabetes Sister     Prior to Admission medications  Medication Sig Start Date End Date Taking? Authorizing Provider  albuterol (PROVENTIL HFA;VENTOLIN HFA) 108 (90 BASE) MCG/ACT inhaler Inhale 2 puffs into the lungs every 4 (four) hours as needed. For shortness of breath   Yes Historical Provider, MD  aspirin EC 81 MG tablet Take 81 mg by mouth daily.   Yes Historical Provider, MD  clobetasol cream (TEMOVATE) AB-123456789 % Apply 1 application topically 2 (two) times daily.   Yes Historical Provider, MD  Diclofenac Sodium 1.5 % SOLN Place 1 each onto  the skin daily as needed (pain).   Yes Historical Provider, MD  Dulaglutide (TRULICITY [place]) Inject 1.7 mLs into the skin once a week. Takes on Sunday   Yes Historical Provider, MD  empagliflozin (JARDIANCE) 10 MG TABS tablet Take 10 mg by mouth daily.   Yes Historical Provider, MD  esomeprazole (NEXIUM) 40 MG capsule Take 40 mg by mouth every morning.    Yes Historical Provider, MD  furosemide (LASIX) 20 MG tablet Take 20 mg by mouth at bedtime.   Yes Historical Provider, MD  HUMALOG KWIKPEN 100 UNIT/ML KiwkPen Inject 5 Units into the skin 3 (three) times daily.  12/26/13  Yes Historical Provider, MD  insulin detemir (LEVEMIR) 100 unit/ml SOLN Inject 0.3 mLs (30 Units total) into the skin at bedtime. Patient taking differently: Inject 35 Units into the skin at bedtime.  08/12/14  Yes Geradine Girt, DO  levothyroxine (SYNTHROID, LEVOTHROID) 150 MCG tablet Take 150 mcg by mouth daily. 10/28/14  Yes Historical Provider, MD  Linaclotide Rolan Lipa) 145 MCG CAPS capsule Take 145 mcg by mouth every morning.   Yes Historical Provider, MD  LYRICA 150 MG capsule Take 150 mg by mouth 3 (three) times daily. 10/28/14  Yes Historical Provider, MD  midodrine (PROAMATINE) 5 MG tablet Take 5 mg by mouth 3 (three) times daily with meals.    Yes Historical Provider, MD  montelukast (SINGULAIR) 10 MG tablet Take 10 mg by mouth at bedtime.   Yes Historical Provider, MD  naproxen sodium (ANAPROX) 220 MG tablet Take 220 mg by mouth 2 (two) times daily as needed (pain).   Yes Historical Provider, MD  polyethylene glycol powder (GLYCOLAX/MIRALAX) powder Take 17 g by mouth daily. 12/16/13  Yes Historical Provider, MD  potassium chloride SA (K-DUR,KLOR-CON) 20 MEQ tablet Take 20 mEq by mouth every morning.   Yes Historical Provider, MD  simvastatin (ZOCOR) 20 MG tablet Take 20 mg by mouth at bedtime.    Yes Historical Provider, MD   Physical Exam: Filed Vitals:   11/01/14 1047 11/01/14 1130 11/01/14 1145 11/01/14 1330  BP:  182/84 131/72 167/67 161/78  Pulse:  79 80 79  Temp: 98 F (36.7 C)     TempSrc: Oral     Resp: 18 21 19 14   SpO2: 100% 100% 96% 99%    Wt Readings from Last 3 Encounters:  09/14/14 77.111 kg (170 lb)  08/11/14 77.338 kg (170 lb 8 oz)  11/02/13 73.483 kg (162 lb)    General:  Average built African-American female laying in bed no distress alert awake oriented 3 Eyes: PERRL, normal lids, irises & conjunctiva, artificial left eye ENT: grossly normal hearing, lips & tongue Neck: no LAD, masses or thyromegaly Cardiovascular: RRR, no m/r/g. Trace edema Telemetry: SR, no arrhythmias  Respiratory: CTA bilaterally, no w/r/r. Normal respiratory effort. Abdomen: soft, ntnd Skin: no rash or induration seen on limited exam Musculoskeletal: grossly normal tone BUE/BLE Psychiatric: grossly normal mood and affect, speech fluent and appropriate  Neurologic: grossly non-focal.          Labs on Admission:  Basic Metabolic Panel:  Recent Labs Lab 11/01/14 1143 11/01/14 1330  NA 138 140  K 7.5* 7.5*  CL 109 107  CO2 25  --   GLUCOSE 350* 334*  BUN 12 17  CREATININE 0.84 0.90  CALCIUM 9.0  --    Liver Function Tests:  Recent Labs Lab 11/01/14 1143  AST 17  ALT 13*  ALKPHOS 104  BILITOT 0.5  PROT 7.0  ALBUMIN 3.1*   No results for input(s): LIPASE, AMYLASE in the last 168 hours. No results for input(s): AMMONIA in the last 168 hours. CBC:  Recent Labs Lab 11/01/14 1143 11/01/14 1330  WBC 8.9  --   NEUTROABS 6.0  --   HGB 13.4 15.6*  HCT 39.7 46.0  MCV 77.8*  --   PLT 195  --    Cardiac Enzymes: No results for input(s): CKTOTAL, CKMB, CKMBINDEX, TROPONINI in the last 168 hours.  BNP (last 3 results) No results for input(s): BNP in the last 8760 hours.  ProBNP (last 3 results) No results for input(s): PROBNP in the last 8760 hours.  CBG:  Recent Labs Lab 11/01/14 1051 11/01/14 1328  GLUCAP 348* 279*    Radiological Exams on Admission: Dg Abd Acute  W/chest  11/01/2014   CLINICAL DATA:  Shortness of breath and diaphoresis. Hyperglycemia. Chest pain.  EXAM: DG ABDOMEN ACUTE W/ 1V CHEST  COMPARISON:  09/14/2014 and abdominal CT 08/13/2011  FINDINGS: Both lungs are clear. Normal appearance of the heart and mediastinum. The trachea is midline. Negative for free air. Cholecystectomy clips in the right upper quadrant of the abdomen. There is a nonspecific bowel gas pattern. Moderate amount of stool. Again noted are sclerotic foci in the iliac wings suggestive for bone islands.  IMPRESSION: Negative abdominal radiographs.  No acute cardiopulmonary disease.   Electronically Signed   By: Markus Daft M.D.   On: 11/01/2014 12:38    EKG: Independently reviewed. NSR, peak t waves  Assessment/Plan   1. Hyperkalemia: -Due to erroneous use -With peak T waves on EKG -Given calcium, IV insulin, bicarbonate, Kayexalate -repeat Bmet and EKG this pm -She is advised to stop potassium, especially since she uses Lasix rarely  2. Uncontrolled diabetes -Continue arrhythmia, sliding scale insulin -Check hemoglobin A1c -Hold oral meds  3. Legally blind  4. Hypothyroidism  5. Constipation -Acute on chronic, increase Miralax, enema 1 -Continue Linzess  DVT Prophylaxis:lovenox  Code Status: Full Code Family Communication: none at bedside Disposition Plan: inpatient  Time spent: 11min  Jearldean Gutt [facility] Pager [phone]

## 2014-11-02 DIAGNOSIS — E875 Hyperkalemia: Principal | ICD-10-CM

## 2014-11-02 LAB — BASIC METABOLIC PANEL WITH GFR
Anion gap: 6 (ref 5–15)
BUN: 14 mg/dL (ref 6–20)
CO2: 27 mmol/L (ref 22–32)
Calcium: 9.7 mg/dL (ref 8.9–10.3)
Chloride: 108 mmol/L (ref 101–111)
Creatinine, Ser: 0.87 mg/dL (ref 0.44–1.00)
GFR calc Af Amer: >60 mL/min
GFR calc non Af Amer: >60 mL/min
Glucose, Bld: 185 mg/dL — ABNORMAL HIGH (ref 65–99)
Potassium: 5 mmol/L (ref 3.5–5.1)
Sodium: 141 mmol/L (ref 135–145)

## 2014-11-02 LAB — CBC
HEMATOCRIT: 38 % (ref 36.0–46.0)
HEMOGLOBIN: 13 g/dL (ref 12.0–15.0)
MCH: 26.6 pg (ref 26.0–34.0)
MCHC: 34.2 g/dL (ref 30.0–36.0)
MCV: 77.9 fL — AB (ref 78.0–100.0)
Platelets: 200 10*3/uL (ref 150–400)
RBC: 4.88 MIL/uL (ref 3.87–5.11)
RDW: 15.6 % — AB (ref 11.5–15.5)
WBC: 7.9 10*3/uL (ref 4.0–10.5)

## 2014-11-02 LAB — GLUCOSE, CAPILLARY
GLUCOSE-CAPILLARY: 115 mg/dL — AB (ref 65–99)
Glucose-Capillary: 228 mg/dL — ABNORMAL HIGH (ref 65–99)
Glucose-Capillary: 172 mg/dL — ABNORMAL HIGH (ref 65–99)
Glucose-Capillary: 353 mg/dL — ABNORMAL HIGH (ref 65–99)

## 2014-11-02 NOTE — Progress Notes (Signed)
Utilization Review completed. Jaelani Posa RN BSN CM 

## 2014-11-02 NOTE — Progress Notes (Signed)
Patient ID: Amy [name], female   DOB: 08/06/1960, 54 y.o.   MRN: [name]  [facility] PROGRESS NOTE  Amy [name] [id number] DOB: 1960-12-19 DOA: 21-Nov-2014 PCP: Velna Hatchet, MD   Brief narrative:    54 y.o. female with DM, legally blind, HLD, hypothyroidism, lives at home alone, presented to ED with multiple concerns including constipation and abd cramping, weakness poor oral intake. Has been taking potassium supplements thinking it will help with cramping but her symptoms have been getting worse.   In ED, pt was noted to be hemodynamically stable, blood work notable for K 7.5. [facility] asked to admit for further evaluation.   Assessment/Plan:    Active Problems:   Severe hyperkalemia - secondary to K supplementation - now resolved but on high end of normal - will monitor for additional 24 hour and repeat BMP in AM      Abd cramping - likely from progressive hyperkalemia - now resolved as K is back to normal limits - advance diet as pt able to tolerate - provide analgesia as needed     Hypothyroidism - continue synthroid - check TSH     DM (diabetes mellitus) with complications of blindness and neuropathy - A1C pending - continue Insulin Detemir along with SSI - continue Lyrica for neuropathies     HLD - continue statin      Hypotension - continue Midodrine  DVT prophylaxis - Lovenox SQ  Code Status: Full.  Family Communication:  plan of care discussed with the patient Disposition Plan: Home in 24 - 48 hours   IV access:  Peripheral IV  Procedures and diagnostic studies:    Dg Abd Acute W/chest November 21, 2014  Negative abdominal radiographs.  No acute cardiopulmonary disease.     Medical Consultants:  None  Other Consultants:  PT  IAnti-Infectives:   None  Faye Ramsay, MD  [facility] Pager [phone]  If 7PM-7AM, please contact night-coverage [email or web address] Password [facility] 11/02/2014, 11:25 AM   LOS: 1 day   HPI/Subjective: No  events overnight.   Objective: Filed Vitals:   11-21-2014 1529 November 21, 2014 2103 11/02/14 0614 11/02/14 0948  BP: 157/78 120/66 118/68 85/48  Pulse: 76 74 69 72  Temp: 98.5 F (36.9 C) 98.2 F (36.8 C) 98.2 F (36.8 C)   TempSrc: Oral Oral Oral   Resp: 16 20 20    Height: 4\' 11"  (1.499 m)     Weight: 76.3 kg (168 lb 3.4 oz)     SpO2: 98% 100% 93%     Intake/Output Summary (Last 24 hours) at 11/02/14 1125 Last data filed at 11/02/14 0326  Gross per 24 hour  Intake   1240 ml  Output    900 ml  Net    340 ml    Exam:   General:  Pt is alert, follows commands appropriately, not in acute distress  Cardiovascular: Regular rate and rhythm, no rubs, no gallops  Respiratory: Clear to auscultation bilaterally, no wheezing, no crackles, no rhonchi  Abdomen: Soft, non tender, non distended, bowel sounds present, no guarding  Extremities: No edema, pulses DP and PT palpable bilaterally  Data Reviewed: Basic Metabolic Panel:  Recent Labs Lab 11/21/2014 1143 11-21-2014 1330 11-21-2014 1822 11/02/14 0334  NA 138 140 142 141  K 7.5* 7.5* 5.1 5.0  CL 109 107 108 108  CO2 25  --  25 27  GLUCOSE 350* 334* 144* 185*  BUN 12 17 11 14   CREATININE 0.84 0.90 0.71 0.87  CALCIUM 9.0  --  10.1 9.7   Liver Function Tests:  Recent Labs Lab 11/01/14 1143  AST 17  ALT 13*  ALKPHOS 104  BILITOT 0.5  PROT 7.0  ALBUMIN 3.1*   CBC:  Recent Labs Lab 11/01/14 1143 11/01/14 1330 11/02/14 0334  WBC 8.9  --  7.9  NEUTROABS 6.0  --   --   HGB 13.4 15.6* 13.0  HCT 39.7 46.0 38.0  MCV 77.8*  --  77.9*  PLT 195  --  200   CBG:  Recent Labs Lab 11/01/14 1328 11/01/14 1446 11/01/14 1702 11/01/14 2311 11/02/14 0850  GLUCAP 279* 74 228* 240* 115*    Scheduled Meds: . aspirin EC  81 mg Oral Daily  . enoxaparin (LOVENOX) injection  40 mg Subcutaneous Q24H  . insulin aspart  0-15 Units Subcutaneous TID WC  . insulin aspart  0-5 Units Subcutaneous QHS  . insulin detemir  30 Units  Subcutaneous QHS  . levothyroxine  150 mcg Oral QAC breakfast  . Linaclotide  145 mcg Oral q morning - 10a  . midodrine  5 mg Oral TID WC  . montelukast  10 mg Oral QHS  . pantoprazole  40 mg Oral Daily  . polyethylene glycol  17 g Oral BID  . pregabalin  150 mg Oral TID  . simvastatin  20 mg Oral QHS   Continuous Infusions:

## 2014-11-02 NOTE — Progress Notes (Signed)
Patient was educated on how to properly self-administer potassium tablets today. Patient had previously been crushing all of the weekly tablets and taking a teaspoonful a day. Patient was educated to take the prescribed dosage and dissolve the tablet in applesauce or water in order to reach the goal of safely administering the correct dosage every day. Patient verbalized understanding of education and goal was met by verbalizing different ways to dissolve potassium tablets.

## 2014-11-03 LAB — BASIC METABOLIC PANEL
ANION GAP: 7 (ref 5–15)
BUN: 18 mg/dL (ref 6–20)
CALCIUM: 9.7 mg/dL (ref 8.9–10.3)
CO2: 30 mmol/L (ref 22–32)
Chloride: 103 mmol/L (ref 101–111)
Creatinine, Ser: 0.98 mg/dL (ref 0.44–1.00)
GFR calc Af Amer: >60 mL/min (ref >60)
Glucose, Bld: 75 mg/dL (ref 65–99)
Potassium: 4.9 mmol/L (ref 3.5–5.1)
SODIUM: 140 mmol/L (ref 135–145)

## 2014-11-03 LAB — CBC
HCT: 40.2 % (ref 36.0–46.0)
HEMOGLOBIN: 13.1 g/dL (ref 12.0–15.0)
MCH: 25.5 pg — ABNORMAL LOW (ref 26.0–34.0)
MCHC: 32.6 g/dL (ref 30.0–36.0)
MCV: 78.4 fL (ref 78.0–100.0)
PLATELETS: 207 10*3/uL (ref 150–400)
RBC: 5.13 MIL/uL — ABNORMAL HIGH (ref 3.87–5.11)
RDW: 15.3 % (ref 11.5–15.5)
WBC: 6.7 10*3/uL (ref 4.0–10.5)

## 2014-11-03 LAB — HEMOGLOBIN A1C
Hgb A1c MFr Bld: 10.1 % — ABNORMAL HIGH (ref 4.8–5.6)
Mean Plasma Glucose: 243 mg/dL

## 2014-11-03 LAB — GLUCOSE, CAPILLARY
Glucose-Capillary: 67 mg/dL (ref 65–99)
Glucose-Capillary: 105 mg/dL — ABNORMAL HIGH (ref 65–99)
Glucose-Capillary: 179 mg/dL — ABNORMAL HIGH (ref 65–99)

## 2014-11-03 MED ORDER — FUROSEMIDE 20 MG PO TABS: 20.0000 mg | ORAL_TABLET | Freq: Every day | ORAL | Status: DC | PRN

## 2014-11-03 MED ORDER — DICLOFENAC SODIUM 1 % TD GEL
2.0000 g | Freq: Four times a day (QID) | TRANSDERMAL | Status: DC | PRN
  Administered 2014-11-03: 2 g via TOPICAL
  Filled 2014-11-03: qty 100

## 2014-11-03 MED ORDER — DICLOFENAC SODIUM 1 % TD GEL: 2.0000 g | Freq: Four times a day (QID) | TRANSDERMAL | Status: DC | PRN

## 2014-11-03 NOTE — Progress Notes (Signed)
Chaplain responded to consult that pt requested information about advanced directives.  Chaplain gave education audibly regarding living will and HCPOA.  Pt wishes to take materials home and discuss with family then bring back notarized form upon return to hospital.  Chaplain also provided emotional support and will follow up as needed.    11/03/14 0900  Clinical Encounter Type  Visited With Patient  Visit Type Initial;Social support  Spiritual Encounters  Spiritual Needs Literature;Emotional  Stress Factors  Patient Stress Factors None identified  Advance Directives (For Healthcare)  Does patient have an advance directive? No  Would patient like information on creating an advanced directive? Yes - Educational materials given   Geralyn Flash 11/03/2014 9:55 AM

## 2014-11-03 NOTE — Discharge Instructions (Signed)
Hyperkalemia Hyperkalemia is when you have too much potassium in your blood. This can be a life-threatening condition. Potassium is normally removed (excreted) from the body by the kidneys. CAUSES  The potassium level in your body can become too high for the following reasons:  You take in too much potassium. You can do this by:  Using salt substitutes. They contain large amounts of potassium.  Taking potassium supplements from your caregiver. The dose may be too high for you.  Eating foods or taking nutritional products with potassium.  You excrete too little potassium. This can happen if:  Your kidneys are not functioning properly. Kidney (renal) disease is a very common cause of hyperkalemia.  You are taking medicines that lower your excretion of potassium, such as certain diuretic medicines.  You have an adrenal gland disease called Addison's disease.  You have a urinary tract obstruction, such as kidney stones.  You are on treatment to mechanically clean your blood (dialysis) and you skip a treatment.  You release a high amount of potassium from your cells into your blood. You may have a condition that causes potassium to move from your cells to your bloodstream. This can happen with:  Injury to muscles or other tissues. Most potassium is stored in the muscles.  Severe burns or infections.  Acidic blood plasma (acidosis). Acidosis can result from many diseases, such as uncontrolled diabetes. SYMPTOMS  Usually, there are no symptoms unless the potassium is dangerously high or has risen very quickly. Symptoms may include:  Irregular or very slow heartbeat.  Feeling sick to your stomach (nauseous).  Tiredness (fatigue).  Nerve problems such as tingling of the skin, numbness of the hands or feet, weakness, or paralysis. DIAGNOSIS  A simple blood test can measure the amount of potassium in your body. An electrocardiogram test of the heart can also help make the diagnosis.  The heart may beat dangerously fast or slow down and stop beating with severe hyperkalemia.  TREATMENT  Treatment depends on how bad the condition is and on the underlying cause.  If the hyperkalemia is an emergency (causing heart problems or paralysis), many different medicines can be used alone or together to lower the potassium level briefly. This may include an insulin injection even if you are not diabetic. Emergency dialysis may be needed to remove potassium from the body.  If the hyperkalemia is less severe or dangerous, the underlying cause is treated. This can include taking medicines if needed. Your prescription medicines may be changed. You may also need to take a medicine to help your body get rid of potassium. You may need to eat a diet low in potassium. HOME CARE INSTRUCTIONS   Take medicines and supplements as directed by your caregiver.  Do not take any over-the-counter medicines, supplements, natural products, herbs, or vitamins without reviewing them with your caregiver. Certain supplements and natural food products can have high amounts of potassium. Other products (such as ibuprofen) can damage weak kidneys and raise your potassium.  You may be asked to do repeat lab tests. Be sure to follow these directions.  If you have kidney disease, you may need to follow a low potassium diet. SEEK MEDICAL CARE IF:   You notice an irregular or very slow heartbeat.  You feel lightheaded.  You develop weakness that is unusual for you. SEEK IMMEDIATE MEDICAL CARE IF:   You have shortness of breath.  You have chest discomfort.  You pass out (faint). MAKE SURE YOU:   Understand   these instructions.  Will watch your condition.  Will get help right away if you are not doing well or get worse. Document Released: 04/22/2002 Document Revised: 07/25/2011 Document Reviewed: 08/07/2013 ExitCare Patient Information 2015 [employer]. This information is not intended to replace  advice given to you by your health care provider. Make sure you discuss any questions you have with your health care provider.  

## 2014-11-03 NOTE — Clinical Social Work Note (Signed)
Clinical Social Work Assessment  Patient Details  Name: Amy [name] MRN: [id number] Date of Birth: 08-25-1960  Date of referral:  11/03/14               Reason for consult:  Discharge Planning, Facility Placement                Permission sought to share information with:  Facility Art therapist granted to share information::  Yes, Verbal Permission Granted  Name::     SNFs  Agency::     Relationship::     Contact Information:     Housing/Transportation Living arrangements for the past 2 months:  Apartment Source of Information:  Patient Patient Interpreter Needed:  None Criminal Activity/Legal Involvement Pertinent to Current Situation/Hospitalization:  No - Comment as needed Significant Relationships:  Adult Children Lives with:  Self Do you feel safe going back to the place where you live?  Yes Need for family participation in patient care:  No (Coment)  Care giving concerns:  Patient does not report any concerns about taking care of herself but is agreeable to going to SNF for short term rehab.   Social Worker assessment / plan:  CSW met with patient at bedside to complete assessment. Per patient, she lives alone in an apartment here in [place]. She reports that she lost vision in 2004 and has managed well with this transition. Her main supports are her two sons, but they do not live in this area. The patient has been to [facility] in the past and plan to go to this facility at discharge. CSW will follow up with bed offers.  Employment status:  Disabled (Comment on whether or not currently receiving Disability) Insurance information:  Managed Medicare PT Recommendations:  [place] / Referral to community resources:  [place]  Patient/Family's Response to care:  Patient has no complaints and seems happy with the care she has received.  Patient/Family's Understanding of and Emotional  Response to Diagnosis, Current Treatment, and Prognosis:  Patient appears to have good insight into reason for admission and understands what her post DC needs with be.  Emotional Assessment Appearance:  Appears stated age Attitude/Demeanor/Rapport:  Other (Appropriate) Affect (typically observed):  Accepting, Calm, Appropriate, Stable Orientation:  Oriented to Self, Oriented to Place, Oriented to  Time, Oriented to Situation Alcohol / Substance use:  Tobacco Use (Quit 22 years ago.) Psych involvement (Current and /or in the community):  No (Comment)  Discharge Needs  Concerns to be addressed:  Discharge Planning Concerns Readmission within the last 30 days:  No Current discharge risk:  Lives alone, Physical Impairment Barriers to Discharge:  No Barriers Identified   Liz Beach MSW, [place], [place], [phone]

## 2014-11-03 NOTE — Evaluation (Signed)
Physical Therapy Evaluation Patient Details Name: Amy [name] MRN: [id number] DOB: 07-26-1960 Today's Date: 11/03/2014   History of Present Illness  54 yo female with uncontrolled DM and leg cramps admitted for correction of labs.  PMHx:  legally blind, PN  Clinical Impression  Pt is being scheduled for SNF placement as she is unable to control her standing balance with some fluctuations in vital signs and is home alone.  Pt presents a high fall risk and will need a RW initially, but can await disposition from SNF to determine the need.  Otherwise has limited equipment as she has been more independent.    Follow Up Recommendations SNF    Equipment Recommendations  Rolling walker with 5" wheels    Recommendations for Other Services       Precautions / Restrictions Precautions Precautions: Fall Restrictions Weight Bearing Restrictions: No      Mobility  Bed Mobility Overal bed mobility: Modified Independent             General bed mobility comments: moved an obstacle and gave verbal cues about this  Transfers Overall transfer level: Needs assistance Equipment used: Rolling walker (2 wheeled);1 person hand held assist Transfers: Sit to/from [facility] Sit to Stand: Min assist Stand pivot transfers: Min assist       General transfer comment: vision not an issue but LOB to L is  Ambulation/Gait Ambulation/Gait assistance: Min assist;Mod assist Ambulation Distance (Feet): 60 Feet Assistive device: Rolling walker (2 wheeled);1 person hand held assist Gait Pattern/deviations: Step-to pattern;Decreased stride length;Wide base of support;Trunk flexed;Staggering left Gait velocity: reduced Gait velocity interpretation: Below normal speed for age/gender General Gait Details: Would be better to have a second person as pt loses balance to L suddenly and with a little force  Stairs            Wheelchair Mobility    Modified Rankin (Stroke  Patients Only)       Balance Overall balance assessment: Needs assistance Sitting-balance support: Feet supported Sitting balance-Leahy Scale: Fair   Postural control: Posterior lean Standing balance support: Bilateral upper extremity supported Standing balance-Leahy Scale: Poor                               Pertinent Vitals/Pain Pain Assessment: Faces Faces Pain Scale: Hurts a little bit Pain Location: knees Pain Intervention(s): Premedicated before session;Monitored during session;Limited activity within patient's tolerance    Home Living Family/patient expects to be discharged to:: Private residence Living Arrangements: Alone Available Help at Discharge: Family;Friend(s) Type of Home: Apartment Home Access: Level entry     Home Layout: One level Home Equipment: Walker - 4 wheels;Cane - single point (cane is for vision trailing)      Prior Function Level of Independence: Independent with assistive device(s)         Comments: Works at [employer] for [employer].  Reports difficulty going down the steps there.     Hand Dominance        Extremity/Trunk Assessment   Upper Extremity Assessment: Overall WFL for tasks assessed           Lower Extremity Assessment: Generalized weakness      Cervical / Trunk Assessment: Normal  Communication   Communication: No difficulties  Cognition Arousal/Alertness: Awake/alert Behavior During Therapy: WFL for tasks assessed/performed Overall Cognitive Status: Within Functional Limits for tasks assessed  General Comments General comments (skin integrity, edema, etc.): Pt is unpredictable for gait and pre gait O2 sat and pulse were 66 pulse and 93% sat, and post was 90% sat and pulse 72    Exercises        Assessment/Plan    PT Assessment Patient needs continued PT services  PT Diagnosis Difficulty walking;Generalized weakness   PT Problem List Decreased  strength;Decreased range of motion;Decreased activity tolerance;Decreased balance;Decreased mobility;Decreased coordination;Decreased knowledge of use of DME;Decreased safety awareness;Decreased knowledge of precautions;Cardiopulmonary status limiting activity  PT Treatment Interventions DME instruction;Gait training;Functional mobility training;Therapeutic activities;Therapeutic exercise;Balance training;Neuromuscular re-education;Patient/family education   PT Goals (Current goals can be found in the Care Plan section) Acute Rehab PT Goals Patient Stated Goal: to get home PT Goal Formulation: With patient Time For Goal Achievement: 11/17/14 Potential to Achieve Goals: Good    Frequency Min 3X/week   Barriers to discharge Decreased caregiver support no assistance for balance with gait    Co-evaluation               End of Session Equipment Utilized During Treatment: Gait belt Activity Tolerance: Patient limited by fatigue;Patient limited by lethargy Patient left: in bed;with call bell/phone within reach Nurse Communication: Mobility status         Time: [id number] PT Time Calculation (min) (ACUTE ONLY): 23 min   Charges:   PT Evaluation $Initial PT Evaluation Tier I: 1 Procedure PT Treatments $Gait Training: 8-22 mins   PT G CodesRamond Dial 11-12-14, 11:50 AM   Mee Hives, PT MS Acute Rehab Dept. Number: [facility] [phone] and [place] [phone]

## 2014-11-03 NOTE — Progress Notes (Signed)
Pt prepared for d/c to SNF. IV d/c'd. Skin intact except as most recently charted. Vitals are stable. Report called to receiving facility. Pt to be transported by ambulance service. 

## 2014-11-03 NOTE — Clinical Social Work Placement (Signed)
   CLINICAL SOCIAL WORK PLACEMENT  NOTE  Date:  11/03/2014  Patient Details  Name: Amy [name] MRN: [id number] Date of Birth: 15-Mar-1961  Clinical Social Work is seeking post-discharge placement for this patient at the [place] level of care (*CSW will initial, date and re-position this form in  chart as items are completed):  Yes   Patient/family provided with [place] Work Department's list of facilities offering this level of care within the geographic area requested by the patient (or if unable, by the patient's family).  Yes   Patient/family informed of their freedom to choose among providers that offer the needed level of care, that participate in Medicare, Medicaid or managed care program needed by the patient, have an available bed and are willing to accept the patient.  Yes   Patient/family informed of [place]'s ownership interest in [facility] and [facility], as well as of the fact that they are under no obligation to receive care at these facilities.  PASRR submitted to EDS on       PASRR number received on       Existing PASRR number confirmed on 11/03/14     FL2 transmitted to all facilities in geographic area requested by pt/family on 11/03/14     FL2 transmitted to all facilities within larger geographic area on       Patient informed that his/her managed care company has contracts with or will negotiate with certain facilities, including the following:        Yes   Patient/family informed of bed offers received.  Patient chooses bed at [facility]     Physician recommends and patient chooses bed at      Patient to be transferred to [facility] on 11/03/14.  Patient to be transferred to facility by Ambulance     Patient family notified on 11/03/14 of transfer.  Name of family member notified:  Patient will notify family.     PHYSICIAN       Additional Comment:   Per MD patient ready for DC to [facility]. RN, patient, patient's family, and facility notified of DC. RN given number for report. DC packet on chart. Ambulance transport requested for patient. CSW signing off.   _______________________________________________ Liz Beach MSW, [place], [place], [id number]

## 2014-11-03 NOTE — Discharge Summary (Signed)
Physician Discharge Summary  Amy [name] [id number] DOB: [birth year] DOA: 2014-11-07  PCP: Amy Hatchet, MD  Admit date: 11-07-2014 Discharge date: 11/03/2014  Recommendations for Outpatient Follow-up:  1. Pt will need to follow up with PCP in 2-3 weeks post discharge 2. Please obtain BMP to evaluate electrolytes and kidney function 3. Please also check CBC to evaluate Hg and Hct levels  Discharge Diagnoses:  Active Problems:   Blindness   Hypothyroidism   DM (diabetes mellitus)   Hyperkalemia  Discharge Condition: Stable  Diet recommendation: Heart healthy diet discussed in details    Brief narrative:    54 y.o. female with DM, legally blind, HLD, hypothyroidism, lives at home alone, presented to ED with multiple concerns including constipation and abd cramping, weakness poor oral intake. Has been taking potassium supplements thinking it will help with cramping but her symptoms have been getting worse.   In ED, pt was noted to be hemodynamically stable, blood work notable for K 7.5. TRH asked to admit for further evaluation.   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  Family Communication: plan of care discussed with the patient Disposition Plan: Patient wants to go to skilled nursing facility  IV access:  Peripheral IV  Procedures and diagnostic studies:   Dg  Abd Acute W/chest 2014-11-07 Negative abdominal radiographs. No acute cardiopulmonary disease.   Medical Consultants:  None  Other Consultants:  PT  IAnti-Infectives:   None       Discharge Exam: Filed Vitals:   11/03/14 0534  BP: 89/61  Pulse: 66  Temp: 98.2 F (36.8 C)  Resp: 20   Filed Vitals:   11/02/14 0948 11/02/14 1433 11/02/14 2100 11/03/14 0534  BP: 85/48 99/55 129/69 89/61  Pulse: 72 76 80 66  Temp:  97.7 F (36.5 C) 98.1 F (36.7 C) 98.2 F (36.8 C)  TempSrc:  Oral Oral Oral  Resp:  18 20 20   Height:      Weight:      SpO2:  100% 95% 93%    General: Pt is alert, follows commands appropriately, not in acute distress Cardiovascular: Regular rate and rhythm, S1/S2 +, no murmurs, no rubs, no gallops Respiratory: Clear to auscultation bilaterally, no wheezing, no crackles, no rhonchi Abdominal: Soft, non tender, non distended, bowel sounds +, no guarding  Discharge Instructions     Medication List    STOP taking these medications        potassium chloride SA 20 MEQ tablet  Commonly known as:  K-DUR,KLOR-CON      TAKE these medications        albuterol 108 (90 BASE) MCG/ACT inhaler  Commonly known as:  PROVENTIL HFA;VENTOLIN HFA  Inhale 2 puffs into the lungs every 4 (four) hours as needed. For shortness of breath     aspirin EC 81 MG tablet  Take 81 mg by mouth daily.     clobetasol cream 0.05 %  Commonly known as:  TEMOVATE  Apply 1 application topically 2 (two) times daily.     Diclofenac Sodium 1.5 % Soln  Place 1 each onto the skin daily as needed (pain).     diclofenac sodium 1 % Gel  Commonly known as:  VOLTAREN  Apply 2 g topically every 6 (six) hours as needed (for pain).     empagliflozin 10 MG Tabs tablet  Commonly known as:  JARDIANCE  Take 10 mg by mouth daily.     esomeprazole 40 MG capsule  Commonly known as:  NEXIUM  Take 40 mg by mouth every morning.     furosemide 20 MG tablet  Commonly known as:   LASIX  Take 1 tablet (20 mg total) by mouth daily as needed for edema.     HUMALOG KWIKPEN 100 UNIT/ML KiwkPen  Generic drug:  insulin lispro  Inject 5 Units into the skin 3 (three) times daily.     insulin detemir 100 unit/ml Soln  Commonly known as:  LEVEMIR  Inject 0.3 mLs (30 Units total) into the skin at bedtime.     levothyroxine 150 MCG tablet  Commonly known as:  SYNTHROID, LEVOTHROID  Take 150 mcg by mouth daily.     LINZESS 145 MCG Caps capsule  Generic drug:  Linaclotide  Take 145 mcg by mouth every morning.     LYRICA 150 MG capsule  Generic drug:  pregabalin  Take 150 mg by mouth 3 (three) times daily.     midodrine 5 MG tablet  Commonly known as:  PROAMATINE  Take 5 mg by mouth 3 (three) times daily with meals.     montelukast 10 MG tablet  Commonly known as:  SINGULAIR  Take 10 mg by mouth at bedtime.     naproxen sodium 220 MG tablet  Commonly known as:  ANAPROX  Take 220 mg by mouth 2 (two) times daily as needed (pain).     polyethylene glycol powder powder  Commonly known as:  GLYCOLAX/MIRALAX  Take 17 g by mouth daily.     simvastatin 20 MG tablet  Commonly known as:  ZOCOR  Take 20 mg by mouth at bedtime.     TRULICITY [place]  Inject 1.7 mLs into the skin once a week. Takes on Sunday            Follow-up Information    Follow up with Amy Hatchet, MD.   Specialty:  Internal Medicine   Contact information:   [address] [place] [address] [phone]        The results of significant diagnostics from this hospitalization (including imaging, microbiology, ancillary and laboratory) are listed below for reference.     Microbiology: No results found for this or any previous visit (from the past 240 hour(s)).   Labs: Basic Metabolic Panel:  Recent Labs Lab 11/01/14 1143 11/01/14 1330 11/01/14 1822 11/02/14 0334  NA 138 140 142 141  K 7.5* 7.5* 5.1 5.0  CL 109 107 108 108  CO2 25  --  25 27  GLUCOSE 350* 334* 144*  185*  BUN 12 17 11 14   CREATININE 0.84 0.90 0.71 0.87  CALCIUM 9.0  --  10.1 9.7   Liver Function Tests:  Recent Labs Lab 11/01/14 1143  AST 17  ALT 13*  ALKPHOS 104  BILITOT 0.5  PROT 7.0  ALBUMIN 3.1*  CBC:  Recent Labs Lab 11/01/14 1143 11/01/14 1330 11/02/14 0334  WBC 8.9  --  7.9  NEUTROABS  6.0  --   --   HGB 13.4 15.6* 13.0  HCT 39.7 46.0 38.0  MCV 77.8*  --  77.9*  PLT 195  --  200    CBG:  Recent Labs Lab 11/01/14 2311 11/02/14 0850 11/02/14 1148 11/02/14 1720 11/02/14 2125  GLUCAP 240* 115* 228* 172* 353*     SIGNED: Time coordinating discharge: 30 minutes  MAGICK-MYERS, ISKRA, MD  [facility] 11/03/2014, 7:39 AM Pager [phone]  If 7PM-7AM, please contact night-coverage [email or web address] Password [email or web address]

## 2014-11-04 ENCOUNTER — Encounter: Payer: Self-pay | Admitting: Internal Medicine

## 2014-11-04 ENCOUNTER — Non-Acute Institutional Stay (SKILLED_NURSING_FACILITY): Payer: Medicare Other | Admitting: Internal Medicine

## 2014-11-04 DIAGNOSIS — E1149 Type 2 diabetes mellitus with other diabetic neurological complication: Secondary | ICD-10-CM | POA: Insufficient documentation

## 2014-11-04 DIAGNOSIS — H547 Unspecified visual loss: Secondary | ICD-10-CM

## 2014-11-04 DIAGNOSIS — E034 Atrophy of thyroid (acquired): Secondary | ICD-10-CM

## 2014-11-04 DIAGNOSIS — E875 Hyperkalemia: Secondary | ICD-10-CM

## 2014-11-04 DIAGNOSIS — J45909 Unspecified asthma, uncomplicated: Secondary | ICD-10-CM | POA: Diagnosis not present

## 2014-11-04 DIAGNOSIS — E785 Hyperlipidemia, unspecified: Secondary | ICD-10-CM

## 2014-11-04 DIAGNOSIS — R4701 Aphasia: Secondary | ICD-10-CM | POA: Diagnosis not present

## 2014-11-04 DIAGNOSIS — E038 Other specified hypothyroidism: Secondary | ICD-10-CM | POA: Diagnosis not present

## 2014-11-04 DIAGNOSIS — M4800 Spinal stenosis, site unspecified: Secondary | ICD-10-CM | POA: Diagnosis not present

## 2014-11-04 DIAGNOSIS — E1140 Type 2 diabetes mellitus with diabetic neuropathy, unspecified: Secondary | ICD-10-CM | POA: Diagnosis not present

## 2014-11-04 DIAGNOSIS — H540 Blindness, both eyes: Secondary | ICD-10-CM

## 2014-11-04 DIAGNOSIS — I951 Orthostatic hypotension: Secondary | ICD-10-CM

## 2014-11-04 NOTE — Progress Notes (Signed)
Patient ID: OPLE GIRGIS, female   DOB: 1960-06-12, 54 y.o.   MRN: [id number]    HISTORY AND PHYSICAL   DATE: 11/04/14   Location:  [place] [facility]: SNF [phone])   Extended Emergency Contact Information Primary Emergency Contact: Lima, Gregory Phone: [phone] Relation: Other Secondary Emergency Contact: Ronnette Hila, [place] [address] [place] Phone: [phone] Relation: Sister  Advanced Directive information  FULL CODE  Chief Complaint  Patient presents with  . New Admit To SNF    HPI:  54 yo female seen today as a new admission into SNF following hospital stay for hyperkalemia and HTN. She has a hx insulin dependent DM, hypothyroidism, asthma and is legally blind. Hospital records reviewed  Today, she is c/a [employer] [place] she attends annually during the beginning of July. She as already paid for the event and would like to attend. Her sister is present today. She is also c/a her speech and has been having difficulty finding words since her admission into the hospital. She does not recall having a CT head performed. Her BS have been elevated and admits to consuming poor food/drink choices. Today she is snacking on corn chips.  Past Medical History  Diagnosis Date  . Asthma   . Diabetes mellitus   . Hypertension   . Blind   . Hypothyroidism   . Hyperlipidemia   . Diabetic neuropathy   . Blindness and low vision     left eye glass eye,  legally blind in right eye  . Spinal stenosis     Past Surgical History  Procedure Laterality Date  . Cesarean section      x 2  . Abdominal hysterectomy    . Cholecystectomy    . Enucleation      Patient Care Team: Velna Hatchet, MD as PCP - General (Internal Medicine)  History   Social History  . Marital Status: Single    Spouse Name: N/A  . Number of Children: N/A  . Years of Education: N/A    Occupational History  . Not on file.   Social History Main Topics  . Smoking status: Former Smoker    Quit date: 05/22/1992  . Smokeless tobacco: Never Used  . Alcohol Use: No  . Drug Use: No  . Sexual Activity: Not Currently   Other Topics Concern  . Not on file   Social History Narrative     reports that she quit smoking about 22 years ago. She has never used smokeless tobacco. She reports that she does not drink alcohol or use illicit drugs.  Family History  Problem Relation Age of Onset  . Diabetes Sister    No family status information on file.    Immunization History  Administered Date(s) Administered  . Influenza,inj,Quad PF,36+ Mos 03/04/2013    Allergies  Allergen Reactions  . Codeine Nausea Only  . Iohexol      Code: HIVES, Desc: pt developed itching and hives along with nasal congestion; needs 13 hour premeds for future studies, Onset Date: [id number]   . Penicillins Hives  . Sulfa Antibiotics Nausea And Vomiting    Medications: Patient's Medications  New Prescriptions   No medications on file  Previous Medications   ALBUTEROL (PROVENTIL HFA;VENTOLIN HFA) 108 (90 BASE) MCG/ACT INHALER    Inhale 2 puffs  into the lungs every 4 (four) hours as needed. For shortness of breath   ASPIRIN EC 81 MG TABLET    Take 81 mg by mouth daily.   CLOBETASOL CREAM (TEMOVATE) 0.05 %    Apply 1 application topically 2 (two) times daily.   DICLOFENAC SODIUM (VOLTAREN) 1 % GEL    Apply 2 g topically every 6 (six) hours as needed (for pain).   DICLOFENAC SODIUM 1.5 % SOLN    Place 1 each onto the skin daily as needed (pain).   DULAGLUTIDE (TRULICITY [place])    Inject 1.7 mLs into the skin once a week. Takes on Sunday   EMPAGLIFLOZIN (JARDIANCE) 10 MG TABS TABLET    Take 10 mg by mouth daily.   ESOMEPRAZOLE (NEXIUM) 40 MG CAPSULE    Take 40 mg by mouth every morning.    FUROSEMIDE (LASIX) 20 MG TABLET    Take 1 tablet (20 mg total) by mouth daily as needed for edema.   HUMALOG  KWIKPEN 100 UNIT/ML KIWKPEN    Inject 5 Units into the skin 3 (three) times daily.    INSULIN DETEMIR (LEVEMIR) 100 UNIT/ML SOLN    Inject 0.3 mLs (30 Units total) into the skin at bedtime.   LEVOTHYROXINE (SYNTHROID, LEVOTHROID) 150 MCG TABLET    Take 150 mcg by mouth daily.   LINACLOTIDE (LINZESS) 145 MCG CAPS CAPSULE    Take 145 mcg by mouth every morning.   LYRICA 150 MG CAPSULE    Take 150 mg by mouth 3 (three) times daily.   MIDODRINE (PROAMATINE) 5 MG TABLET    Take 5 mg by mouth 3 (three) times daily with meals.    MONTELUKAST (SINGULAIR) 10 MG TABLET    Take 10 mg by mouth at bedtime.   NAPROXEN SODIUM (ANAPROX) 220 MG TABLET    Take 220 mg by mouth 2 (two) times daily as needed (pain).   POLYETHYLENE GLYCOL POWDER (GLYCOLAX/MIRALAX) POWDER    Take 17 g by mouth daily.   SIMVASTATIN (ZOCOR) 20 MG TABLET    Take 20 mg by mouth at bedtime.   Modified Medications   No medications on file  Discontinued Medications   No medications on file    Review of Systems  Unable to perform ROS: Other  speech difficulty  Filed Vitals:   11/04/14 1642  BP: 113/77  Pulse: 69  Temp: 97.7 F (36.5 C)  Weight: 181 lb (82.101 kg)  SpO2: 96%   Body mass index is 36.54 kg/(m^2).  Physical Exam  Constitutional: She appears well-developed.  Sitting in w/c in NAD  HENT:  Mouth/Throat: Oropharynx is clear and moist. No oropharyngeal exudate.  Eyes: No scleral icterus.  Neck: Neck supple. Carotid bruit is not present. No tracheal deviation present. No thyromegaly present.  Cardiovascular: Normal rate, regular rhythm, normal heart sounds and intact distal pulses.  Exam reveals no gallop and no friction rub.   No murmur heard. No LE edema b/l. no calf TTP.   Pulmonary/Chest: Effort normal and breath sounds normal. No stridor. No respiratory distress. She has no wheezes. She has no rales.  Abdominal: Soft. Bowel sounds are normal. She exhibits no distension and no mass. There is no hepatomegaly.  There is no tenderness. There is no rebound and no guarding.  Lymphadenopathy:    She has no cervical adenopathy.  Neurological: She is alert.  Skin: Skin is warm and dry. No rash noted.  Psychiatric: She has a normal mood and affect. Her behavior is  normal.  (+) expressive aphasia     Labs reviewed: Admission on 11/01/2014, Discharged on 11/03/2014  Component Date Value Ref Range Status  . Glucose-Capillary 11/01/2014 348* 65 - 99 mg/dL Final  . WBC 11/01/2014 8.9  4.0 - 10.5 K/uL Final   WHITE COUNT CONFIRMED ON SMEAR  . RBC 11/01/2014 5.10  3.87 - 5.11 MIL/uL Final  . Hemoglobin 11/01/2014 13.4  12.0 - 15.0 g/dL Final  . HCT 11/01/2014 39.7  36.0 - 46.0 % Final  . MCV 11/01/2014 77.8* 78.0 - 100.0 fL Final  . MCH 11/01/2014 26.3  26.0 - 34.0 pg Final  . MCHC 11/01/2014 33.8  30.0 - 36.0 g/dL Final  . RDW 11/01/2014 15.1  11.5 - 15.5 % Final  . Platelets 11/01/2014 195  150 - 400 K/uL Final  . Neutrophils Relative % 11/01/2014 67  43 - 77 % Final  . Lymphocytes Relative 11/01/2014 24  12 - 46 % Final  . Monocytes Relative 11/01/2014 8  3 - 12 % Final  . Eosinophils Relative 11/01/2014 1  0 - 5 % Final  . Basophils Relative 11/01/2014 0  0 - 1 % Final  . Neutro Abs 11/01/2014 6.0  1.7 - 7.7 K/uL Final  . Lymphs Abs 11/01/2014 2.1  0.7 - 4.0 K/uL Final  . Monocytes Absolute 11/01/2014 0.7  0.1 - 1.0 K/uL Final  . Eosinophils Absolute 11/01/2014 0.1  0.0 - 0.7 K/uL Final  . Basophils Absolute 11/01/2014 0.0  0.0 - 0.1 K/uL Final  . WBC Morphology 11/01/2014 TOXIC GRANULATION   Final  . Sodium 11/01/2014 138  135 - 145 mmol/L Final  . Potassium 11/01/2014 7.5* 3.5 - 5.1 mmol/L Final   Comment: REPEATED TO VERIFY SPECIMEN HEMOLYZED. HEMOLYSIS MAY AFFECT INTEGRITY OF RESULTS. MATT BEASLEY,RN AT [id number] 11/01/14 BY ZBEECH. CRITICAL RESULT CALLED TO, READ BACK BY AND VERIFIED WITH: MATT BEASLEY,RN AT 1307 11/01/14 BY ZBEECH.   . Chloride 11/01/2014 109  101 - 111 mmol/L Final  . CO2  11/01/2014 25  22 - 32 mmol/L Final  . Glucose, Bld 11/01/2014 350* 65 - 99 mg/dL Final  . BUN 11/01/2014 12  6 - 20 mg/dL Final  . Creatinine, Ser 11/01/2014 0.84  0.44 - 1.00 mg/dL Final  . Calcium 11/01/2014 9.0  8.9 - 10.3 mg/dL Final  . Total Protein 11/01/2014 7.0  6.5 - 8.1 g/dL Final  . Albumin 11/01/2014 3.1* 3.5 - 5.0 g/dL Final  . AST 11/01/2014 17  15 - 41 U/L Final  . ALT 11/01/2014 13* 14 - 54 U/L Final  . Alkaline Phosphatase 11/01/2014 104  38 - 126 U/L Final  . Total Bilirubin 11/01/2014 0.5  0.3 - 1.2 mg/dL Final  . GFR calc non Af Amer 11/01/2014 >60  >60 mL/min Final  . GFR calc Af Amer 11/01/2014 >60  >60 mL/min Final   Comment: (NOTE) The eGFR has been calculated using the CKD EPI equation. This calculation has not been validated in all clinical situations. eGFR's persistently <60 mL/min signify possible Chronic Kidney Disease.   . Anion gap 11/01/2014 4* 5 - 15 Final  . Color, Urine 11/01/2014 YELLOW  YELLOW Final  . APPearance 11/01/2014 CLEAR  CLEAR Final  . Specific Gravity, Urine 11/01/2014 1.012  1.005 - 1.030 Final  . pH 11/01/2014 8.0  5.0 - 8.0 Final  . Glucose, UA 11/01/2014 500* NEGATIVE mg/dL Final  . Hgb urine dipstick 11/01/2014 NEGATIVE  NEGATIVE Final  . Bilirubin Urine 11/01/2014 NEGATIVE  NEGATIVE  Final  . Ketones, ur 11/01/2014 NEGATIVE  NEGATIVE mg/dL Final  . Protein, ur 11/01/2014 100* NEGATIVE mg/dL Final  . Urobilinogen, UA 11/01/2014 0.2  0.0 - 1.0 mg/dL Final  . Nitrite 11/01/2014 NEGATIVE  NEGATIVE Final  . Leukocytes, UA 11/01/2014 NEGATIVE  NEGATIVE Final  . Troponin i, poc 11/01/2014 0.00  0.00 - 0.08 ng/mL Final  . Comment 3 11/01/2014          Final   Comment: Due to the release kinetics of cTnI, a negative result within the first hours of the onset of symptoms does not rule out myocardial infarction with certainty. If myocardial infarction is still suspected, repeat the test at appropriate intervals.   . Squamous  Epithelial / LPF 11/01/2014 RARE  RARE Final  . WBC, UA 11/01/2014 0-2  <3 WBC/hpf Final  . RBC / HPF 11/01/2014 0-2  <3 RBC/hpf Final  . Sodium 11/01/2014 140  135 - 145 mmol/L Final  . Potassium 11/01/2014 7.5* 3.5 - 5.1 mmol/L Final  . Chloride 11/01/2014 107  101 - 111 mmol/L Final  . BUN 11/01/2014 17  6 - 20 mg/dL Final  . Creatinine, Ser 11/01/2014 0.90  0.44 - 1.00 mg/dL Final  . Glucose, Bld 11/01/2014 334* 65 - 99 mg/dL Final  . Calcium, Ion 11/01/2014 1.22  1.12 - 1.23 mmol/L Final  . TCO2 11/01/2014 25  0 - 100 mmol/L Final  . Hemoglobin 11/01/2014 15.6* 12.0 - 15.0 g/dL Final  . HCT 11/01/2014 46.0  36.0 - 46.0 % Final  . Comment 11/01/2014 NOTIFIED PHYSICIAN   Final  . Glucose-Capillary 11/01/2014 279* 65 - 99 mg/dL Final  . Sodium 11/01/2014 142  135 - 145 mmol/L Final  . Potassium 11/01/2014 5.1  3.5 - 5.1 mmol/L Final   DELTA CHECK NOTED  . Chloride 11/01/2014 108  101 - 111 mmol/L Final  . CO2 11/01/2014 25  22 - 32 mmol/L Final  . Glucose, Bld 11/01/2014 144* 65 - 99 mg/dL Final  . BUN 11/01/2014 11  6 - 20 mg/dL Final  . Creatinine, Ser 11/01/2014 0.71  0.44 - 1.00 mg/dL Final  . Calcium 11/01/2014 10.1  8.9 - 10.3 mg/dL Final  . GFR calc non Af Amer 11/01/2014 >60  >60 mL/min Final  . GFR calc Af Amer 11/01/2014 >60  >60 mL/min Final   Comment: (NOTE) The eGFR has been calculated using the CKD EPI equation. This calculation has not been validated in all clinical situations. eGFR's persistently <60 mL/min signify possible Chronic Kidney Disease.   . Anion gap 11/01/2014 9  5 - 15 Final  . Hgb A1c MFr Bld 11/01/2014 10.1* 4.8 - 5.6 % Final   Comment: (NOTE)         Pre-diabetes: 5.7 - 6.4         Diabetes: >6.4         Glycemic control for adults with diabetes: <7.0   . Mean Plasma Glucose 11/01/2014 243   Final   Comment: (NOTE) Performed At: [facility] [place], [place] [id number] Lindon Romp MD [id number]   .  Glucose-Capillary 11/01/2014 74  65 - 99 mg/dL Final  . WBC 11/02/2014 7.9  4.0 - 10.5 K/uL Final  . RBC 11/02/2014 4.88  3.87 - 5.11 MIL/uL Final  . Hemoglobin 11/02/2014 13.0  12.0 - 15.0 g/dL Final  . HCT 11/02/2014 38.0  36.0 - 46.0 % Final  . MCV 11/02/2014 77.9* 78.0 - 100.0 fL Final  .  MCH 11/02/2014 26.6  26.0 - 34.0 pg Final  . MCHC 11/02/2014 34.2  30.0 - 36.0 g/dL Final  . RDW 11/02/2014 15.6* 11.5 - 15.5 % Final  . Platelets 11/02/2014 200  150 - 400 K/uL Final  . Sodium 11/02/2014 141  135 - 145 mmol/L Final  . Potassium 11/02/2014 5.0  3.5 - 5.1 mmol/L Final  . Chloride 11/02/2014 108  101 - 111 mmol/L Final  . CO2 11/02/2014 27  22 - 32 mmol/L Final  . Glucose, Bld 11/02/2014 185* 65 - 99 mg/dL Final  . BUN 11/02/2014 14  6 - 20 mg/dL Final  . Creatinine, Ser 11/02/2014 0.87  0.44 - 1.00 mg/dL Final  . Calcium 11/02/2014 9.7  8.9 - 10.3 mg/dL Final  . GFR calc non Af Amer 11/02/2014 >60  >60 mL/min Final  . GFR calc Af Amer 11/02/2014 >60  >60 mL/min Final   Comment: (NOTE) The eGFR has been calculated using the CKD EPI equation. This calculation has not been validated in all clinical situations. eGFR's persistently <60 mL/min signify possible Chronic Kidney Disease.   . Anion gap 11/02/2014 6  5 - 15 Final  . Glucose-Capillary 11/01/2014 228* 65 - 99 mg/dL Final  . Glucose-Capillary 11/01/2014 240* 65 - 99 mg/dL Final  . Comment 1 11/01/2014 Notify RN   Final  . Glucose-Capillary 11/02/2014 115* 65 - 99 mg/dL Final  . Glucose-Capillary 11/02/2014 228* 65 - 99 mg/dL Final  . WBC 11/03/2014 6.7  4.0 - 10.5 K/uL Final  . RBC 11/03/2014 5.13* 3.87 - 5.11 MIL/uL Final  . Hemoglobin 11/03/2014 13.1  12.0 - 15.0 g/dL Final  . HCT 11/03/2014 40.2  36.0 - 46.0 % Final  . MCV 11/03/2014 78.4  78.0 - 100.0 fL Final  . MCH 11/03/2014 25.5* 26.0 - 34.0 pg Final  . MCHC 11/03/2014 32.6  30.0 - 36.0 g/dL Final  . RDW 11/03/2014 15.3  11.5 - 15.5 % Final  . Platelets  11/03/2014 207  150 - 400 K/uL Final  . Sodium 11/03/2014 140  135 - 145 mmol/L Final  . Potassium 11/03/2014 4.9  3.5 - 5.1 mmol/L Final  . Chloride 11/03/2014 103  101 - 111 mmol/L Final  . CO2 11/03/2014 30  22 - 32 mmol/L Final  . Glucose, Bld 11/03/2014 75  65 - 99 mg/dL Final  . BUN 11/03/2014 18  6 - 20 mg/dL Final  . Creatinine, Ser 11/03/2014 0.98  0.44 - 1.00 mg/dL Final  . Calcium 11/03/2014 9.7  8.9 - 10.3 mg/dL Final  . GFR calc non Af Amer 11/03/2014 >60  >60 mL/min Final  . GFR calc Af Amer 11/03/2014 >60  >60 mL/min Final   Comment: (NOTE) The eGFR has been calculated using the CKD EPI equation. This calculation has not been validated in all clinical situations. eGFR's persistently <60 mL/min signify possible Chronic Kidney Disease.   . Anion gap 11/03/2014 7  5 - 15 Final  . Glucose-Capillary 11/02/2014 172* 65 - 99 mg/dL Final  . Glucose-Capillary 11/02/2014 353* 65 - 99 mg/dL Final  . Glucose-Capillary 11/03/2014 67  65 - 99 mg/dL Final  . Glucose-Capillary 11/03/2014 105* 65 - 99 mg/dL Final  . Glucose-Capillary 11/03/2014 179* 65 - 99 mg/dL Final  Admission on 08/08/2014, Discharged on 08/12/2014  Component Date Value Ref Range Status  . Sodium 08/08/2014 138  135 - 145 mmol/L Final  . Potassium 08/08/2014 3.7  3.5 - 5.1 mmol/L Final  . Chloride 08/08/2014 102  96 - 112 mmol/L Final  . CO2 08/08/2014 22  19 - 32 mmol/L Final  . Glucose, Bld 08/08/2014 501* 70 - 99 mg/dL Final  . BUN 08/08/2014 20  6 - 23 mg/dL Final  . Creatinine, Ser 08/08/2014 1.18* 0.50 - 1.10 mg/dL Final  . Calcium 08/08/2014 8.8  8.4 - 10.5 mg/dL Final  . Total Protein 08/08/2014 6.7  6.0 - 8.3 g/dL Final  . Albumin 08/08/2014 2.9* 3.5 - 5.2 g/dL Final  . AST 08/08/2014 18  0 - 37 U/L Final  . ALT 08/08/2014 14  0 - 35 U/L Final  . Alkaline Phosphatase 08/08/2014 106  39 - 117 U/L Final  . Total Bilirubin 08/08/2014 0.5  0.3 - 1.2 mg/dL Final  . GFR calc non Af Amer 08/08/2014 51* >90  mL/min Final  . GFR calc Af Amer 08/08/2014 59* >90 mL/min Final   Comment: (NOTE) The eGFR has been calculated using the CKD EPI equation. This calculation has not been validated in all clinical situations. eGFR's persistently <90 mL/min signify possible Chronic Kidney Disease.   . Anion gap 08/08/2014 14  5 - 15 Final  . WBC 08/08/2014 7.1  4.0 - 10.5 K/uL Final  . RBC 08/08/2014 4.91  3.87 - 5.11 MIL/uL Final  . Hemoglobin 08/08/2014 13.1  12.0 - 15.0 g/dL Final  . HCT 08/08/2014 38.6  36.0 - 46.0 % Final  . MCV 08/08/2014 78.6  78.0 - 100.0 fL Final  . MCH 08/08/2014 26.7  26.0 - 34.0 pg Final  . MCHC 08/08/2014 33.9  30.0 - 36.0 g/dL Final  . RDW 08/08/2014 14.7  11.5 - 15.5 % Final  . Platelets 08/08/2014 137* 150 - 400 K/uL Final  . Neutrophils Relative % 08/08/2014 56  43 - 77 % Final  . Lymphocytes Relative 08/08/2014 26  12 - 46 % Final  . Monocytes Relative 08/08/2014 17* 3 - 12 % Final  . Eosinophils Relative 08/08/2014 1  0 - 5 % Final  . Basophils Relative 08/08/2014 0  0 - 1 % Final  . Neutro Abs 08/08/2014 4.0  1.7 - 7.7 K/uL Final  . Lymphs Abs 08/08/2014 1.8  0.7 - 4.0 K/uL Final  . Monocytes Absolute 08/08/2014 1.2* 0.1 - 1.0 K/uL Final  . Eosinophils Absolute 08/08/2014 0.1  0.0 - 0.7 K/uL Final  . Basophils Absolute 08/08/2014 0.0  0.0 - 0.1 K/uL Final  . Smear Review 08/08/2014 MORPHOLOGY UNREMARKABLE   Final  . Color, Urine 08/08/2014 YELLOW  YELLOW Final  . APPearance 08/08/2014 CLEAR  CLEAR Final  . Specific Gravity, Urine 08/08/2014 1.026  1.005 - 1.030 Final  . pH 08/08/2014 5.5  5.0 - 8.0 Final  . Glucose, UA 08/08/2014 >1000* NEGATIVE mg/dL Final  . Hgb urine dipstick 08/08/2014 TRACE* NEGATIVE Final  . Bilirubin Urine 08/08/2014 NEGATIVE  NEGATIVE Final  . Ketones, ur 08/08/2014 NEGATIVE  NEGATIVE mg/dL Final  . Protein, ur 08/08/2014 NEGATIVE  NEGATIVE mg/dL Final  . Urobilinogen, UA 08/08/2014 0.2  0.0 - 1.0 mg/dL Final  . Nitrite 08/08/2014  NEGATIVE  NEGATIVE Final  . Leukocytes, UA 08/08/2014 NEGATIVE  NEGATIVE Final  . Troponin I 08/08/2014 0.04* <0.031 ng/mL Final   Comment:        PERSISTENTLY INCREASED TROPONIN VALUES IN THE RANGE OF 0.04-0.49 ng/mL CAN BE SEEN IN:       -UNSTABLE ANGINA       -CONGESTIVE HEART FAILURE       -MYOCARDITIS       -  CHEST TRAUMA       -ARRYHTHMIAS       -LATE PRESENTING MYOCARDIAL INFARCTION       -COPD   CLINICAL FOLLOW-UP RECOMMENDED.   Marland Kitchen Glucose-Capillary 08/08/2014 401* 70 - 99 mg/dL Final  . pH, Arterial 08/08/2014 7.502* 7.350 - 7.450 Final  . pCO2 arterial 08/08/2014 25.5* 35.0 - 45.0 mmHg Final  . pO2, Arterial 08/08/2014 78.0* 80.0 - 100.0 mmHg Final  . Bicarbonate 08/08/2014 20.0  20.0 - 24.0 mEq/L Final  . TCO2 08/08/2014 21  0 - 100 mmol/L Final  . O2 Saturation 08/08/2014 97.0   Final  . Acid-base deficit 08/08/2014 2.0  0.0 - 2.0 mmol/L Final  . Collection site 08/08/2014 FEMORAL ARTERY   Final  . Sample type 08/08/2014 ARTERIAL   Final  . Squamous Epithelial / LPF 08/08/2014 RARE  RARE Final  . WBC, UA 08/08/2014 0-2  <3 WBC/hpf Final  . RBC / HPF 08/08/2014 0-2  <3 RBC/hpf Final  . Urine-Other 08/08/2014 RARE YEAST   Final  . Glucose-Capillary 08/08/2014 294* 70 - 99 mg/dL Final  . Glucose-Capillary 08/08/2014 205* 70 - 99 mg/dL Final  . Troponin I 08/08/2014 <0.03  <0.031 ng/mL Final   Comment:        NO INDICATION OF MYOCARDIAL INJURY.   . Troponin I 08/08/2014 <0.03  <0.031 ng/mL Final   Comment:        NO INDICATION OF MYOCARDIAL INJURY.   . Troponin I 08/09/2014 <0.03  <0.031 ng/mL Final   Comment:        NO INDICATION OF MYOCARDIAL INJURY.   . Influenza A By PCR 08/08/2014 POSITIVE* NEGATIVE Final  . Influenza B By PCR 08/08/2014 NEGATIVE  NEGATIVE Final  . H1N1 flu by pcr 08/08/2014 DETECTED* NOT DETECTED Final   Comment:        The Xpert Flu assay (FDA approved for nasal aspirates or washes and nasopharyngeal swab specimens), is intended as  an aid in the diagnosis of influenza and should not be used as a sole basis for treatment.   . Glucose-Capillary 08/08/2014 184* 70 - 99 mg/dL Final  . Sodium 08/09/2014 144  135 - 145 mmol/L Final  . Potassium 08/09/2014 3.5  3.5 - 5.1 mmol/L Final  . Chloride 08/09/2014 114* 96 - 112 mmol/L Final   DELTA CHECK NOTED  . CO2 08/09/2014 26  19 - 32 mmol/L Final  . Glucose, Bld 08/09/2014 42* 70 - 99 mg/dL Final   Comment: REPEATED TO VERIFY CRITICAL RESULT CALLED TO, READ BACK BY AND VERIFIED WITH: E.GASTOKE RN [id number] 08/09/14 E.GADDY   . BUN 08/09/2014 12  6 - 23 mg/dL Final  . Creatinine, Ser 08/09/2014 0.85  0.50 - 1.10 mg/dL Final  . Calcium 08/09/2014 7.8* 8.4 - 10.5 mg/dL Final  . GFR calc non Af Amer 08/09/2014 76* >90 mL/min Final  . GFR calc Af Amer 08/09/2014 88* >90 mL/min Final   Comment: (NOTE) The eGFR has been calculated using the CKD EPI equation. This calculation has not been validated in all clinical situations. eGFR's persistently <90 mL/min signify possible Chronic Kidney Disease.   . Anion gap 08/09/2014 4* 5 - 15 Final  . WBC 08/09/2014 6.3  4.0 - 10.5 K/uL Final  . RBC 08/09/2014 4.49  3.87 - 5.11 MIL/uL Final  . Hemoglobin 08/09/2014 11.7* 12.0 - 15.0 g/dL Final  . HCT 08/09/2014 36.1  36.0 - 46.0 % Final  . MCV 08/09/2014 80.4  78.0 - 100.0 fL Final  .  MCH 08/09/2014 26.1  26.0 - 34.0 pg Final  . MCHC 08/09/2014 32.4  30.0 - 36.0 g/dL Final  . RDW 08/09/2014 14.9  11.5 - 15.5 % Final  . Platelets 08/09/2014 135* 150 - 400 K/uL Final  . Glucose-Capillary 08/08/2014 171* 70 - 99 mg/dL Final  . Glucose-Capillary 08/08/2014 134* 70 - 99 mg/dL Final  . Glucose-Capillary 08/09/2014 155* 70 - 99 mg/dL Final  . Glucose-Capillary 08/09/2014 132* 70 - 99 mg/dL Final  . Glucose-Capillary 08/09/2014 24* 70 - 99 mg/dL Final  . Comment 1 08/09/2014 Notify RN   Final  . Glucose-Capillary 08/09/2014 162* 70 - 99 mg/dL Final  . Glucose-Capillary 08/09/2014 160* 70 - 99  mg/dL Final  . Glucose-Capillary 08/09/2014 170* 70 - 99 mg/dL Final  . Glucose-Capillary 08/10/2014 183* 70 - 99 mg/dL Final  . Glucose-Capillary 08/10/2014 178* 70 - 99 mg/dL Final  . Glucose-Capillary 08/10/2014 186* 70 - 99 mg/dL Final  . Glucose-Capillary 08/10/2014 232* 70 - 99 mg/dL Final  . Glucose-Capillary 08/10/2014 167* 70 - 99 mg/dL Final  . Sodium 08/11/2014 140  135 - 145 mmol/L Final  . Potassium 08/11/2014 3.9  3.5 - 5.1 mmol/L Final  . Chloride 08/11/2014 108  96 - 112 mmol/L Final  . CO2 08/11/2014 26  19 - 32 mmol/L Final  . Glucose, Bld 08/11/2014 142* 70 - 99 mg/dL Final  . BUN 08/11/2014 7  6 - 23 mg/dL Final  . Creatinine, Ser 08/11/2014 0.74  0.50 - 1.10 mg/dL Final  . Calcium 08/11/2014 8.7  8.4 - 10.5 mg/dL Final  . GFR calc non Af Amer 08/11/2014 >90  >90 mL/min Final  . GFR calc Af Amer 08/11/2014 >90  >90 mL/min Final   Comment: (NOTE) The eGFR has been calculated using the CKD EPI equation. This calculation has not been validated in all clinical situations. eGFR's persistently <90 mL/min signify possible Chronic Kidney Disease.   . Anion gap 08/11/2014 6  5 - 15 Final  . Hgb A1c MFr Bld 08/11/2014 10.8* 4.8 - 5.6 % Final   Comment: (NOTE)         Pre-diabetes: 5.7 - 6.4         Diabetes: >6.4         Glycemic control for adults with diabetes: <7.0   . Mean Plasma Glucose 08/11/2014 263   Final   Comment: (NOTE) Performed At: [facility] [place], [place] [id number] Lindon Romp MD [id number]   . WBC 08/11/2014 6.5  4.0 - 10.5 K/uL Final  . RBC 08/11/2014 4.60  3.87 - 5.11 MIL/uL Final  . Hemoglobin 08/11/2014 12.2  12.0 - 15.0 g/dL Final  . HCT 08/11/2014 36.2  36.0 - 46.0 % Final  . MCV 08/11/2014 78.7  78.0 - 100.0 fL Final  . MCH 08/11/2014 26.5  26.0 - 34.0 pg Final  . MCHC 08/11/2014 33.7  30.0 - 36.0 g/dL Final  . RDW 08/11/2014 14.8  11.5 - 15.5 % Final  . Platelets 08/11/2014 133* 150 - 400 K/uL Final    Comment: SPECIMEN CHECKED FOR CLOTS REPEATED TO VERIFY   . Glucose-Capillary 08/10/2014 220* 70 - 99 mg/dL Final  . Glucose-Capillary 08/11/2014 281* 70 - 99 mg/dL Final  . Glucose-Capillary 08/11/2014 162* 70 - 99 mg/dL Final  . Glucose-Capillary 08/11/2014 90  70 - 99 mg/dL Final  . Glucose-Capillary 08/11/2014 93  70 - 99 mg/dL Final  . Glucose-Capillary 08/11/2014 210* 70 - 99 mg/dL Final  .  Glucose-Capillary 08/11/2014 231* 70 - 99 mg/dL Final  . Glucose-Capillary 08/12/2014 76  70 - 99 mg/dL Final  . Glucose-Capillary 08/12/2014 178* 70 - 99 mg/dL Final  Admission on 08/08/2014, Discharged on 08/08/2014  Component Date Value Ref Range Status  . Glucose-Capillary 08/08/2014 534* 70 - 99 mg/dL Final  . Sodium 08/08/2014 137  135 - 145 mmol/L Final  . Potassium 08/08/2014 4.2  3.5 - 5.1 mmol/L Final  . Chloride 08/08/2014 99  96 - 112 mmol/L Final  . BUN 08/08/2014 22  6 - 23 mg/dL Final  . Creatinine, Ser 08/08/2014 1.20* 0.50 - 1.10 mg/dL Final  . Glucose, Bld 08/08/2014 632* 70 - 99 mg/dL Final  . Calcium, Ion 08/08/2014 1.12  1.12 - 1.23 mmol/L Final  . TCO2 08/08/2014 26  0 - 100 mmol/L Final  . Hemoglobin 08/08/2014 14.6  12.0 - 15.0 g/dL Final  . HCT 08/08/2014 43.0  36.0 - 46.0 % Final  . Comment 08/08/2014 NOTIFIED PHYSICIAN   Final  . Sodium 08/08/2014 136  135 - 145 mmol/L Final  . Potassium 08/08/2014 3.9  3.5 - 5.1 mmol/L Final  . Chloride 08/08/2014 99  96 - 112 mmol/L Final  . CO2 08/08/2014 29  19 - 32 mmol/L Final  . Glucose, Bld 08/08/2014 640* 70 - 99 mg/dL Final   Comment: REPEATED TO VERIFY CRITICAL RESULT CALLED TO, READ BACK BY AND VERIFIED WITH: K.PATE,RN [id number] 08/08/14 CLARK,S   . BUN 08/08/2014 20  6 - 23 mg/dL Final  . Creatinine, Ser 08/08/2014 1.37* 0.50 - 1.10 mg/dL Final  . Calcium 08/08/2014 8.7  8.4 - 10.5 mg/dL Final  . Total Protein 08/08/2014 6.8  6.0 - 8.3 g/dL Final  . Albumin 08/08/2014 3.0* 3.5 - 5.2 g/dL Final  . AST 08/08/2014 14  0 -  37 U/L Final  . ALT 08/08/2014 14  0 - 35 U/L Final  . Alkaline Phosphatase 08/08/2014 112  39 - 117 U/L Final  . Total Bilirubin 08/08/2014 0.6  0.3 - 1.2 mg/dL Final  . GFR calc non Af Amer 08/08/2014 43* >90 mL/min Final  . GFR calc Af Amer 08/08/2014 49* >90 mL/min Final   Comment: (NOTE) The eGFR has been calculated using the CKD EPI equation. This calculation has not been validated in all clinical situations. eGFR's persistently <90 mL/min signify possible Chronic Kidney Disease.   . Anion gap 08/08/2014 8  5 - 15 Final  . WBC 08/08/2014 6.7  4.0 - 10.5 K/uL Final  . RBC 08/08/2014 4.89  3.87 - 5.11 MIL/uL Final  . Hemoglobin 08/08/2014 12.8  12.0 - 15.0 g/dL Final   Comment: REPEATED TO VERIFY SPECIMEN CHECKED FOR CLOTS   . HCT 08/08/2014 39.0  36.0 - 46.0 % Final  . MCV 08/08/2014 79.8  78.0 - 100.0 fL Final  . MCH 08/08/2014 26.2  26.0 - 34.0 pg Final  . MCHC 08/08/2014 32.8  30.0 - 36.0 g/dL Final  . RDW 08/08/2014 14.8  11.5 - 15.5 % Final  . Platelets 08/08/2014 135* 150 - 400 K/uL Final   Comment: SPECIMEN CHECKED FOR CLOTS REPEATED TO VERIFY   . Neutrophils Relative % 08/08/2014 60  43 - 77 % Final  . Neutro Abs 08/08/2014 4.0  1.7 - 7.7 K/uL Final  . Lymphocytes Relative 08/08/2014 20  12 - 46 % Final  . Lymphs Abs 08/08/2014 1.3  0.7 - 4.0 K/uL Final  . Monocytes Relative 08/08/2014 20* 3 - 12 % Final  .  Monocytes Absolute 08/08/2014 1.3* 0.1 - 1.0 K/uL Final  . Eosinophils Relative 08/08/2014 1  0 - 5 % Final  . Eosinophils Absolute 08/08/2014 0.1  0.0 - 0.7 K/uL Final  . Basophils Relative 08/08/2014 0  0 - 1 % Final  . Basophils Absolute 08/08/2014 0.0  0.0 - 0.1 K/uL Final    Dg Abd Acute W/chest  11/01/2014   CLINICAL DATA:  Shortness of breath and diaphoresis. Hyperglycemia. Chest pain.  EXAM: DG ABDOMEN ACUTE W/ 1V CHEST  COMPARISON:  09/14/2014 and abdominal CT 08/13/2011  FINDINGS: Both lungs are clear. Normal appearance of the heart and mediastinum.  The trachea is midline. Negative for free air. Cholecystectomy clips in the right upper quadrant of the abdomen. There is a nonspecific bowel gas pattern. Moderate amount of stool. Again noted are sclerotic foci in the iliac wings suggestive for bone islands.  IMPRESSION: Negative abdominal radiographs.  No acute cardiopulmonary disease.   Electronically Signed   By: Markus Daft M.D.   On: 11/01/2014 12:38     Assessment/Plan   ICD-9-CM ICD-10-CM   1. Expressive aphasia - new 784.3 R47.01   2. Type 2 diabetes mellitus with diabetic neuropathy - uncontrolled 250.60 E11.40    357.2    3. Hyperkalemia - stable 276.7 E87.5   4. Blindness - unchanged 369.00 H54.0   5. Hypothyroidism due to acquired atrophy of thyroid - stable 244.8 E03.8    246.8 E03.4   6. Hyperlipidemia LDL goal <70 - stable 272.4 E78.5   7. Orthostatic hypotension - stable 458.0 I95.1   8. Asthma, unspecified asthma severity, uncomplicated - stable 591.02 J45.909   9. Spinal stenosis, multilevel - stable 724.00 M48.00     --check CT head without contrast  --check BMP to follow potassium  --increase levemir 34 units qhs  --cont other meds as ordered  --[place] O2 prn to keep O2 sats >90%  --PT/OT as ordered. May need ST as well  --GOAL: short term rehab and d/c home when medically appropriate. Communicated with pt and nursing.  Megumi Treaster S. Perlie Gold  [facility] [address] [place], [place] [address] [phone] Cell (Monday-Friday 8 AM - 5 PM) [phone] After 5 PM and follow prompts

## 2014-11-11 ENCOUNTER — Non-Acute Institutional Stay (SKILLED_NURSING_FACILITY): Payer: Medicare Other | Admitting: Internal Medicine

## 2014-11-11 ENCOUNTER — Encounter: Payer: Self-pay | Admitting: Internal Medicine

## 2014-11-11 DIAGNOSIS — E785 Hyperlipidemia, unspecified: Secondary | ICD-10-CM | POA: Diagnosis not present

## 2014-11-11 DIAGNOSIS — M4800 Spinal stenosis, site unspecified: Secondary | ICD-10-CM | POA: Diagnosis not present

## 2014-11-11 DIAGNOSIS — J45909 Unspecified asthma, uncomplicated: Secondary | ICD-10-CM | POA: Diagnosis not present

## 2014-11-11 DIAGNOSIS — K5900 Constipation, unspecified: Secondary | ICD-10-CM

## 2014-11-11 DIAGNOSIS — E1140 Type 2 diabetes mellitus with diabetic neuropathy, unspecified: Secondary | ICD-10-CM

## 2014-11-11 DIAGNOSIS — R4701 Aphasia: Secondary | ICD-10-CM

## 2014-11-11 DIAGNOSIS — E034 Atrophy of thyroid (acquired): Secondary | ICD-10-CM | POA: Diagnosis not present

## 2014-11-11 DIAGNOSIS — I951 Orthostatic hypotension: Secondary | ICD-10-CM | POA: Diagnosis not present

## 2014-11-11 DIAGNOSIS — H547 Unspecified visual loss: Secondary | ICD-10-CM

## 2014-11-11 DIAGNOSIS — E038 Other specified hypothyroidism: Secondary | ICD-10-CM

## 2014-11-11 DIAGNOSIS — H540 Blindness, both eyes: Secondary | ICD-10-CM

## 2014-11-11 NOTE — Progress Notes (Signed)
Patient ID: Amy [name], female   DOB: August 22, 1960, 54 y.o.   MRN: [id number]    DATE: 11/11/14  Location:  [place] of Service: SNF [phone])   Extended Emergency Contact Information Primary Emergency Contact: [place], [place] Phone: [phone] Relation: Other Secondary Emergency Contact: Ronnette Hila, [place] [address] [place] Phone: [phone] Relation: Sister  Advanced Directive information  FULL CODE  Chief Complaint  Patient presents with  . Discharge Note    HPI:  54 yo female seen today for d/c from SNF following short rehab for hyperkalemia and HTN. She is legally blind. She is planning to attend the [facility] which she has already paid to attend. She will need [place] home care aid. PCP to determine if she needs home PT. No DME req'd   Past Medical History  Diagnosis Date  . Asthma   . Diabetes mellitus   . Hypertension   . Blind   . Hypothyroidism   . Hyperlipidemia   . Diabetic neuropathy   . Blindness and low vision     left eye glass eye,  legally blind in right eye  . Spinal stenosis     Past Surgical History  Procedure Laterality Date  . Cesarean section      x 2  . Abdominal hysterectomy    . Cholecystectomy    . Enucleation      Patient Care Team: Velna Hatchet, MD as PCP - General (Internal Medicine)  History   Social History  . Marital Status: Single    Spouse Name: N/A  . Number of Children: N/A  . Years of Education: N/A   Occupational History  . Not on file.   Social History Main Topics  . Smoking status: Former Smoker    Quit date: 05/22/1992  . Smokeless tobacco: Never Used  . Alcohol Use: No  . Drug Use: No  . Sexual Activity: Not Currently   Other Topics Concern  . Not on file   Social History Narrative     reports that she quit smoking about 22 years ago. She has never used smokeless tobacco. She reports that she  does not drink alcohol or use illicit drugs.  Immunization History  Administered Date(s) Administered  . Influenza,inj,Quad PF,36+ Mos 03/04/2013    Allergies  Allergen Reactions  . Codeine Nausea Only  . Iohexol      Code: HIVES, Desc: pt developed itching and hives along with nasal congestion; needs 13 hour premeds for future studies, Onset Date: [id number]   . Penicillins Hives  . Sulfa Antibiotics Nausea And Vomiting    Medications: Patient's Medications  New Prescriptions   No medications on file  Previous Medications   ALBUTEROL (PROVENTIL HFA;VENTOLIN HFA) 108 (90 BASE) MCG/ACT INHALER    Inhale 2 puffs into the lungs every 4 (four) hours as needed. For shortness of breath   ASPIRIN EC 81 MG TABLET    Take 81 mg by mouth daily.   CLOBETASOL CREAM (TEMOVATE) 0.05 %    Apply 1 application topically 2 (two) times daily.   DICLOFENAC SODIUM (VOLTAREN) 1 % GEL    Apply 2 g topically every 6 (six) hours as needed (for pain).   DICLOFENAC SODIUM 1.5 % SOLN    Place 1 each onto the skin daily as needed (pain).  DULAGLUTIDE (TRULICITY [facility])    Inject 1.7 mLs into the skin once a week. Takes on Sunday   EMPAGLIFLOZIN (JARDIANCE) 10 MG TABS TABLET    Take 10 mg by mouth daily.   ESOMEPRAZOLE (NEXIUM) 40 MG CAPSULE    Take 40 mg by mouth every morning.    FUROSEMIDE (LASIX) 20 MG TABLET    Take 1 tablet (20 mg total) by mouth daily as needed for edema.   HUMALOG KWIKPEN 100 UNIT/ML KIWKPEN    Inject 5 Units into the skin 3 (three) times daily.    INSULIN DETEMIR (LEVEMIR) 100 UNIT/ML SOLN    Inject 0.3 mLs (30 Units total) into the skin at bedtime.   LEVOTHYROXINE (SYNTHROID, LEVOTHROID) 150 MCG TABLET    Take 150 mcg by mouth daily.   LINACLOTIDE (LINZESS) 145 MCG CAPS CAPSULE    Take 145 mcg by mouth every morning.   LYRICA 150 MG CAPSULE    Take 150 mg by mouth 3 (three) times daily.   MIDODRINE (PROAMATINE) 5 MG TABLET    Take 5 mg by mouth 3 (three) times daily with meals.     MONTELUKAST (SINGULAIR) 10 MG TABLET    Take 10 mg by mouth at bedtime.   NAPROXEN SODIUM (ANAPROX) 220 MG TABLET    Take 220 mg by mouth 2 (two) times daily as needed (pain).   POLYETHYLENE GLYCOL POWDER (GLYCOLAX/MIRALAX) POWDER    Take 17 g by mouth daily.   SIMVASTATIN (ZOCOR) 20 MG TABLET    Take 20 mg by mouth at bedtime.   Modified Medications   No medications on file  Discontinued Medications   No medications on file    Review of Systems  Unable to perform ROS: Other    Filed Vitals:   11/11/14 1921  BP: 125/75  Pulse: 76  Temp: 97.5 F (36.4 C)  Weight: 171 lb (77.565 kg)  SpO2: 96%   Body mass index is 34.52 kg/(m^2).  Physical Exam  Constitutional: She appears well-developed and well-nourished.  Neurological: She is alert.  Skin: Skin is warm and dry. No rash noted.  Psychiatric: She has a normal mood and affect. Her behavior is normal. Thought content normal.     Labs reviewed: Admission on 11/01/2014, Discharged on 11/03/2014  Component Date Value Ref Range Status  . Glucose-Capillary 11/01/2014 348* 65 - 99 mg/dL Final  . WBC 11/01/2014 8.9  4.0 - 10.5 K/uL Final   WHITE COUNT CONFIRMED ON SMEAR  . RBC 11/01/2014 5.10  3.87 - 5.11 MIL/uL Final  . Hemoglobin 11/01/2014 13.4  12.0 - 15.0 g/dL Final  . HCT 11/01/2014 39.7  36.0 - 46.0 % Final  . MCV 11/01/2014 77.8* 78.0 - 100.0 fL Final  . MCH 11/01/2014 26.3  26.0 - 34.0 pg Final  . MCHC 11/01/2014 33.8  30.0 - 36.0 g/dL Final  . RDW 11/01/2014 15.1  11.5 - 15.5 % Final  . Platelets 11/01/2014 195  150 - 400 K/uL Final  . Neutrophils Relative % 11/01/2014 67  43 - 77 % Final  . Lymphocytes Relative 11/01/2014 24  12 - 46 % Final  . Monocytes Relative 11/01/2014 8  3 - 12 % Final  . Eosinophils Relative 11/01/2014 1  0 - 5 % Final  . Basophils Relative 11/01/2014 0  0 - 1 % Final  . Neutro Abs 11/01/2014 6.0  1.7 - 7.7 K/uL Final  . Lymphs Abs 11/01/2014 2.1  0.7 - 4.0 K/uL Final  . Monocytes  Absolute  11/01/2014 0.7  0.1 - 1.0 K/uL Final  . Eosinophils Absolute 11/01/2014 0.1  0.0 - 0.7 K/uL Final  . Basophils Absolute 11/01/2014 0.0  0.0 - 0.1 K/uL Final  . WBC Morphology 11/01/2014 TOXIC GRANULATION   Final  . Sodium 11/01/2014 138  135 - 145 mmol/L Final  . Potassium 11/01/2014 7.5* 3.5 - 5.1 mmol/L Final   Comment: REPEATED TO VERIFY SPECIMEN HEMOLYZED. HEMOLYSIS MAY AFFECT INTEGRITY OF RESULTS. MATT BEASLEY,RN AT 0156 11/01/14 BY ZBEECH. CRITICAL RESULT CALLED TO, READ BACK BY AND VERIFIED WITH: MATT BEASLEY,RN AT 1307 11/01/14 BY ZBEECH.   . Chloride 11/01/2014 109  101 - 111 mmol/L Final  . CO2 11/01/2014 25  22 - 32 mmol/L Final  . Glucose, Bld 11/01/2014 350* 65 - 99 mg/dL Final  . BUN 11/01/2014 12  6 - 20 mg/dL Final  . Creatinine, Ser 11/01/2014 0.84  0.44 - 1.00 mg/dL Final  . Calcium 11/01/2014 9.0  8.9 - 10.3 mg/dL Final  . Total Protein 11/01/2014 7.0  6.5 - 8.1 g/dL Final  . Albumin 11/01/2014 3.1* 3.5 - 5.0 g/dL Final  . AST 11/01/2014 17  15 - 41 U/L Final  . ALT 11/01/2014 13* 14 - 54 U/L Final  . Alkaline Phosphatase 11/01/2014 104  38 - 126 U/L Final  . Total Bilirubin 11/01/2014 0.5  0.3 - 1.2 mg/dL Final  . GFR calc non Af Amer 11/01/2014 >60  >60 mL/min Final  . GFR calc Af Amer 11/01/2014 >60  >60 mL/min Final   Comment: (NOTE) The eGFR has been calculated using the CKD EPI equation. This calculation has not been validated in all clinical situations. eGFR's persistently <60 mL/min signify possible Chronic Kidney Disease.   . Anion gap 11/01/2014 4* 5 - 15 Final  . Color, Urine 11/01/2014 YELLOW  YELLOW Final  . APPearance 11/01/2014 CLEAR  CLEAR Final  . Specific Gravity, Urine 11/01/2014 1.012  1.005 - 1.030 Final  . pH 11/01/2014 8.0  5.0 - 8.0 Final  . Glucose, UA 11/01/2014 500* NEGATIVE mg/dL Final  . Hgb urine dipstick 11/01/2014 NEGATIVE  NEGATIVE Final  . Bilirubin Urine 11/01/2014 NEGATIVE  NEGATIVE Final  . Ketones, ur 11/01/2014 NEGATIVE   NEGATIVE mg/dL Final  . Protein, ur 11/01/2014 100* NEGATIVE mg/dL Final  . Urobilinogen, UA 11/01/2014 0.2  0.0 - 1.0 mg/dL Final  . Nitrite 11/01/2014 NEGATIVE  NEGATIVE Final  . Leukocytes, UA 11/01/2014 NEGATIVE  NEGATIVE Final  . Troponin i, poc 11/01/2014 0.00  0.00 - 0.08 ng/mL Final  . Comment 3 11/01/2014          Final   Comment: Due to the release kinetics of cTnI, a negative result within the first hours of the onset of symptoms does not rule out myocardial infarction with certainty. If myocardial infarction is still suspected, repeat the test at appropriate intervals.   . Squamous Epithelial / LPF 11/01/2014 RARE  RARE Final  . WBC, UA 11/01/2014 0-2  <3 WBC/hpf Final  . RBC / HPF 11/01/2014 0-2  <3 RBC/hpf Final  . Sodium 11/01/2014 140  135 - 145 mmol/L Final  . Potassium 11/01/2014 7.5* 3.5 - 5.1 mmol/L Final  . Chloride 11/01/2014 107  101 - 111 mmol/L Final  . BUN 11/01/2014 17  6 - 20 mg/dL Final  . Creatinine, Ser 11/01/2014 0.90  0.44 - 1.00 mg/dL Final  . Glucose, Bld 11/01/2014 334* 65 - 99 mg/dL Final  . Calcium, Ion 11/01/2014 1.22  1.12 -  1.23 mmol/L Final  . TCO2 11/01/2014 25  0 - 100 mmol/L Final  . Hemoglobin 11/01/2014 15.6* 12.0 - 15.0 g/dL Final  . HCT 11/01/2014 46.0  36.0 - 46.0 % Final  . Comment 11/01/2014 NOTIFIED PHYSICIAN   Final  . Glucose-Capillary 11/01/2014 279* 65 - 99 mg/dL Final  . Sodium 11/01/2014 142  135 - 145 mmol/L Final  . Potassium 11/01/2014 5.1  3.5 - 5.1 mmol/L Final   DELTA CHECK NOTED  . Chloride 11/01/2014 108  101 - 111 mmol/L Final  . CO2 11/01/2014 25  22 - 32 mmol/L Final  . Glucose, Bld 11/01/2014 144* 65 - 99 mg/dL Final  . BUN 11/01/2014 11  6 - 20 mg/dL Final  . Creatinine, Ser 11/01/2014 0.71  0.44 - 1.00 mg/dL Final  . Calcium 11/01/2014 10.1  8.9 - 10.3 mg/dL Final  . GFR calc non Af Amer 11/01/2014 >60  >60 mL/min Final  . GFR calc Af Amer 11/01/2014 >60  >60 mL/min Final   Comment: (NOTE) The eGFR has  been calculated using the CKD EPI equation. This calculation has not been validated in all clinical situations. eGFR's persistently <60 mL/min signify possible Chronic Kidney Disease.   . Anion gap 11/01/2014 9  5 - 15 Final  . Hgb A1c MFr Bld 11/01/2014 10.1* 4.8 - 5.6 % Final   Comment: (NOTE)         Pre-diabetes: 5.7 - 6.4         Diabetes: >6.4         Glycemic control for adults with diabetes: <7.0   . Mean Plasma Glucose 11/01/2014 243   Final   Comment: (NOTE) Performed At: [facility] [place], [place] [id number] Lindon Romp MD [id number]   . Glucose-Capillary 11/01/2014 74  65 - 99 mg/dL Final  . WBC 11/02/2014 7.9  4.0 - 10.5 K/uL Final  . RBC 11/02/2014 4.88  3.87 - 5.11 MIL/uL Final  . Hemoglobin 11/02/2014 13.0  12.0 - 15.0 g/dL Final  . HCT 11/02/2014 38.0  36.0 - 46.0 % Final  . MCV 11/02/2014 77.9* 78.0 - 100.0 fL Final  . MCH 11/02/2014 26.6  26.0 - 34.0 pg Final  . MCHC 11/02/2014 34.2  30.0 - 36.0 g/dL Final  . RDW 11/02/2014 15.6* 11.5 - 15.5 % Final  . Platelets 11/02/2014 200  150 - 400 K/uL Final  . Sodium 11/02/2014 141  135 - 145 mmol/L Final  . Potassium 11/02/2014 5.0  3.5 - 5.1 mmol/L Final  . Chloride 11/02/2014 108  101 - 111 mmol/L Final  . CO2 11/02/2014 27  22 - 32 mmol/L Final  . Glucose, Bld 11/02/2014 185* 65 - 99 mg/dL Final  . BUN 11/02/2014 14  6 - 20 mg/dL Final  . Creatinine, Ser 11/02/2014 0.87  0.44 - 1.00 mg/dL Final  . Calcium 11/02/2014 9.7  8.9 - 10.3 mg/dL Final  . GFR calc non Af Amer 11/02/2014 >60  >60 mL/min Final  . GFR calc Af Amer 11/02/2014 >60  >60 mL/min Final   Comment: (NOTE) The eGFR has been calculated using the CKD EPI equation. This calculation has not been validated in all clinical situations. eGFR's persistently <60 mL/min signify possible Chronic Kidney Disease.   . Anion gap 11/02/2014 6  5 - 15 Final  . Glucose-Capillary 11/01/2014 228* 65 - 99 mg/dL Final  .  Glucose-Capillary 11/01/2014 240* 65 - 99 mg/dL Final  . Comment 1 11/01/2014 Notify RN  Final  . Glucose-Capillary 11/02/2014 115* 65 - 99 mg/dL Final  . Glucose-Capillary 11/02/2014 228* 65 - 99 mg/dL Final  . WBC 11/03/2014 6.7  4.0 - 10.5 K/uL Final  . RBC 11/03/2014 5.13* 3.87 - 5.11 MIL/uL Final  . Hemoglobin 11/03/2014 13.1  12.0 - 15.0 g/dL Final  . HCT 11/03/2014 40.2  36.0 - 46.0 % Final  . MCV 11/03/2014 78.4  78.0 - 100.0 fL Final  . MCH 11/03/2014 25.5* 26.0 - 34.0 pg Final  . MCHC 11/03/2014 32.6  30.0 - 36.0 g/dL Final  . RDW 11/03/2014 15.3  11.5 - 15.5 % Final  . Platelets 11/03/2014 207  150 - 400 K/uL Final  . Sodium 11/03/2014 140  135 - 145 mmol/L Final  . Potassium 11/03/2014 4.9  3.5 - 5.1 mmol/L Final  . Chloride 11/03/2014 103  101 - 111 mmol/L Final  . CO2 11/03/2014 30  22 - 32 mmol/L Final  . Glucose, Bld 11/03/2014 75  65 - 99 mg/dL Final  . BUN 11/03/2014 18  6 - 20 mg/dL Final  . Creatinine, Ser 11/03/2014 0.98  0.44 - 1.00 mg/dL Final  . Calcium 11/03/2014 9.7  8.9 - 10.3 mg/dL Final  . GFR calc non Af Amer 11/03/2014 >60  >60 mL/min Final  . GFR calc Af Amer 11/03/2014 >60  >60 mL/min Final   Comment: (NOTE) The eGFR has been calculated using the CKD EPI equation. This calculation has not been validated in all clinical situations. eGFR's persistently <60 mL/min signify possible Chronic Kidney Disease.   . Anion gap 11/03/2014 7  5 - 15 Final  . Glucose-Capillary 11/02/2014 172* 65 - 99 mg/dL Final  . Glucose-Capillary 11/02/2014 353* 65 - 99 mg/dL Final  . Glucose-Capillary 11/03/2014 67  65 - 99 mg/dL Final  . Glucose-Capillary 11/03/2014 105* 65 - 99 mg/dL Final  . Glucose-Capillary 11/03/2014 179* 65 - 99 mg/dL Final  Admission on 08/08/2014, Discharged on 08/12/2014  Component Date Value Ref Range Status  . Sodium 08/08/2014 138  135 - 145 mmol/L Final  . Potassium 08/08/2014 3.7  3.5 - 5.1 mmol/L Final  . Chloride 08/08/2014 102  96 - 112  mmol/L Final  . CO2 08/08/2014 22  19 - 32 mmol/L Final  . Glucose, Bld 08/08/2014 501* 70 - 99 mg/dL Final  . BUN 08/08/2014 20  6 - 23 mg/dL Final  . Creatinine, Ser 08/08/2014 1.18* 0.50 - 1.10 mg/dL Final  . Calcium 08/08/2014 8.8  8.4 - 10.5 mg/dL Final  . Total Protein 08/08/2014 6.7  6.0 - 8.3 g/dL Final  . Albumin 08/08/2014 2.9* 3.5 - 5.2 g/dL Final  . AST 08/08/2014 18  0 - 37 U/L Final  . ALT 08/08/2014 14  0 - 35 U/L Final  . Alkaline Phosphatase 08/08/2014 106  39 - 117 U/L Final  . Total Bilirubin 08/08/2014 0.5  0.3 - 1.2 mg/dL Final  . GFR calc non Af Amer 08/08/2014 51* >90 mL/min Final  . GFR calc Af Amer 08/08/2014 59* >90 mL/min Final   Comment: (NOTE) The eGFR has been calculated using the CKD EPI equation. This calculation has not been validated in all clinical situations. eGFR's persistently <90 mL/min signify possible Chronic Kidney Disease.   . Anion gap 08/08/2014 14  5 - 15 Final  . WBC 08/08/2014 7.1  4.0 - 10.5 K/uL Final  . RBC 08/08/2014 4.91  3.87 - 5.11 MIL/uL Final  . Hemoglobin 08/08/2014 13.1  12.0 - 15.0 g/dL  Final  . HCT 08/08/2014 38.6  36.0 - 46.0 % Final  . MCV 08/08/2014 78.6  78.0 - 100.0 fL Final  . MCH 08/08/2014 26.7  26.0 - 34.0 pg Final  . MCHC 08/08/2014 33.9  30.0 - 36.0 g/dL Final  . RDW 08/08/2014 14.7  11.5 - 15.5 % Final  . Platelets 08/08/2014 137* 150 - 400 K/uL Final  . Neutrophils Relative % 08/08/2014 56  43 - 77 % Final  . Lymphocytes Relative 08/08/2014 26  12 - 46 % Final  . Monocytes Relative 08/08/2014 17* 3 - 12 % Final  . Eosinophils Relative 08/08/2014 1  0 - 5 % Final  . Basophils Relative 08/08/2014 0  0 - 1 % Final  . Neutro Abs 08/08/2014 4.0  1.7 - 7.7 K/uL Final  . Lymphs Abs 08/08/2014 1.8  0.7 - 4.0 K/uL Final  . Monocytes Absolute 08/08/2014 1.2* 0.1 - 1.0 K/uL Final  . Eosinophils Absolute 08/08/2014 0.1  0.0 - 0.7 K/uL Final  . Basophils Absolute 08/08/2014 0.0  0.0 - 0.1 K/uL Final  . Smear Review  08/08/2014 MORPHOLOGY UNREMARKABLE   Final  . Color, Urine 08/08/2014 YELLOW  YELLOW Final  . APPearance 08/08/2014 CLEAR  CLEAR Final  . Specific Gravity, Urine 08/08/2014 1.026  1.005 - 1.030 Final  . pH 08/08/2014 5.5  5.0 - 8.0 Final  . Glucose, UA 08/08/2014 >1000* NEGATIVE mg/dL Final  . Hgb urine dipstick 08/08/2014 TRACE* NEGATIVE Final  . Bilirubin Urine 08/08/2014 NEGATIVE  NEGATIVE Final  . Ketones, ur 08/08/2014 NEGATIVE  NEGATIVE mg/dL Final  . Protein, ur 08/08/2014 NEGATIVE  NEGATIVE mg/dL Final  . Urobilinogen, UA 08/08/2014 0.2  0.0 - 1.0 mg/dL Final  . Nitrite 08/08/2014 NEGATIVE  NEGATIVE Final  . Leukocytes, UA 08/08/2014 NEGATIVE  NEGATIVE Final  . Troponin I 08/08/2014 0.04* <0.031 ng/mL Final   Comment:        PERSISTENTLY INCREASED TROPONIN VALUES IN THE RANGE OF 0.04-0.49 ng/mL CAN BE SEEN IN:       -UNSTABLE ANGINA       -CONGESTIVE HEART FAILURE       -MYOCARDITIS       -CHEST TRAUMA       -ARRYHTHMIAS       -LATE PRESENTING MYOCARDIAL INFARCTION       -COPD   CLINICAL FOLLOW-UP RECOMMENDED.   Marland Kitchen Glucose-Capillary 08/08/2014 401* 70 - 99 mg/dL Final  . pH, Arterial 08/08/2014 7.502* 7.350 - 7.450 Final  . pCO2 arterial 08/08/2014 25.5* 35.0 - 45.0 mmHg Final  . pO2, Arterial 08/08/2014 78.0* 80.0 - 100.0 mmHg Final  . Bicarbonate 08/08/2014 20.0  20.0 - 24.0 mEq/L Final  . TCO2 08/08/2014 21  0 - 100 mmol/L Final  . O2 Saturation 08/08/2014 97.0   Final  . Acid-base deficit 08/08/2014 2.0  0.0 - 2.0 mmol/L Final  . Collection site 08/08/2014 FEMORAL ARTERY   Final  . Sample type 08/08/2014 ARTERIAL   Final  . Squamous Epithelial / LPF 08/08/2014 RARE  RARE Final  . WBC, UA 08/08/2014 0-2  <3 WBC/hpf Final  . RBC / HPF 08/08/2014 0-2  <3 RBC/hpf Final  . Urine-Other 08/08/2014 RARE YEAST   Final  . Glucose-Capillary 08/08/2014 294* 70 - 99 mg/dL Final  . Glucose-Capillary 08/08/2014 205* 70 - 99 mg/dL Final  . Troponin I 08/08/2014 <0.03  <0.031  ng/mL Final   Comment:        NO INDICATION OF MYOCARDIAL INJURY.   Marland Kitchen  Troponin I 08/08/2014 <0.03  <0.031 ng/mL Final   Comment:        NO INDICATION OF MYOCARDIAL INJURY.   . Troponin I 08/09/2014 <0.03  <0.031 ng/mL Final   Comment:        NO INDICATION OF MYOCARDIAL INJURY.   . Influenza A By PCR 08/08/2014 POSITIVE* NEGATIVE Final  . Influenza B By PCR 08/08/2014 NEGATIVE  NEGATIVE Final  . H1N1 flu by pcr 08/08/2014 DETECTED* NOT DETECTED Final   Comment:        The Xpert Flu assay (FDA approved for nasal aspirates or washes and nasopharyngeal swab specimens), is intended as an aid in the diagnosis of influenza and should not be used as a sole basis for treatment.   . Glucose-Capillary 08/08/2014 184* 70 - 99 mg/dL Final  . Sodium 08/09/2014 144  135 - 145 mmol/L Final  . Potassium 08/09/2014 3.5  3.5 - 5.1 mmol/L Final  . Chloride 08/09/2014 114* 96 - 112 mmol/L Final   DELTA CHECK NOTED  . CO2 08/09/2014 26  19 - 32 mmol/L Final  . Glucose, Bld 08/09/2014 42* 70 - 99 mg/dL Final   Comment: REPEATED TO VERIFY CRITICAL RESULT CALLED TO, READ BACK BY AND VERIFIED WITH: E.GASTOKE RN [id number] 08/09/14 E.GADDY   . BUN 08/09/2014 12  6 - 23 mg/dL Final  . Creatinine, Ser 08/09/2014 0.85  0.50 - 1.10 mg/dL Final  . Calcium 08/09/2014 7.8* 8.4 - 10.5 mg/dL Final  . GFR calc non Af Amer 08/09/2014 76* >90 mL/min Final  . GFR calc Af Amer 08/09/2014 88* >90 mL/min Final   Comment: (NOTE) The eGFR has been calculated using the CKD EPI equation. This calculation has not been validated in all clinical situations. eGFR's persistently <90 mL/min signify possible Chronic Kidney Disease.   . Anion gap 08/09/2014 4* 5 - 15 Final  . WBC 08/09/2014 6.3  4.0 - 10.5 K/uL Final  . RBC 08/09/2014 4.49  3.87 - 5.11 MIL/uL Final  . Hemoglobin 08/09/2014 11.7* 12.0 - 15.0 g/dL Final  . HCT 08/09/2014 36.1  36.0 - 46.0 % Final  . MCV 08/09/2014 80.4  78.0 - 100.0 fL Final  . MCH 08/09/2014  26.1  26.0 - 34.0 pg Final  . MCHC 08/09/2014 32.4  30.0 - 36.0 g/dL Final  . RDW 08/09/2014 14.9  11.5 - 15.5 % Final  . Platelets 08/09/2014 135* 150 - 400 K/uL Final  . Glucose-Capillary 08/08/2014 171* 70 - 99 mg/dL Final  . Glucose-Capillary 08/08/2014 134* 70 - 99 mg/dL Final  . Glucose-Capillary 08/09/2014 155* 70 - 99 mg/dL Final  . Glucose-Capillary 08/09/2014 132* 70 - 99 mg/dL Final  . Glucose-Capillary 08/09/2014 24* 70 - 99 mg/dL Final  . Comment 1 08/09/2014 Notify RN   Final  . Glucose-Capillary 08/09/2014 162* 70 - 99 mg/dL Final  . Glucose-Capillary 08/09/2014 160* 70 - 99 mg/dL Final  . Glucose-Capillary 08/09/2014 170* 70 - 99 mg/dL Final  . Glucose-Capillary 08/10/2014 183* 70 - 99 mg/dL Final  . Glucose-Capillary 08/10/2014 178* 70 - 99 mg/dL Final  . Glucose-Capillary 08/10/2014 186* 70 - 99 mg/dL Final  . Glucose-Capillary 08/10/2014 232* 70 - 99 mg/dL Final  . Glucose-Capillary 08/10/2014 167* 70 - 99 mg/dL Final  . Sodium 08/11/2014 140  135 - 145 mmol/L Final  . Potassium 08/11/2014 3.9  3.5 - 5.1 mmol/L Final  . Chloride 08/11/2014 108  96 - 112 mmol/L Final  . CO2 08/11/2014 26  19 - 32  mmol/L Final  . Glucose, Bld 08/11/2014 142* 70 - 99 mg/dL Final  . BUN 08/11/2014 7  6 - 23 mg/dL Final  . Creatinine, Ser 08/11/2014 0.74  0.50 - 1.10 mg/dL Final  . Calcium 08/11/2014 8.7  8.4 - 10.5 mg/dL Final  . GFR calc non Af Amer 08/11/2014 >90  >90 mL/min Final  . GFR calc Af Amer 08/11/2014 >90  >90 mL/min Final   Comment: (NOTE) The eGFR has been calculated using the CKD EPI equation. This calculation has not been validated in all clinical situations. eGFR's persistently <90 mL/min signify possible Chronic Kidney Disease.   . Anion gap 08/11/2014 6  5 - 15 Final  . Hgb A1c MFr Bld 08/11/2014 10.8* 4.8 - 5.6 % Final   Comment: (NOTE)         Pre-diabetes: 5.7 - 6.4         Diabetes: >6.4         Glycemic control for adults with diabetes: <7.0   . Mean  Plasma Glucose 08/11/2014 263   Final   Comment: (NOTE) Performed At: [facility] [address] [place], [place] [id number] Lindon Romp MD [id number]   . WBC 08/11/2014 6.5  4.0 - 10.5 K/uL Final  . RBC 08/11/2014 4.60  3.87 - 5.11 MIL/uL Final  . Hemoglobin 08/11/2014 12.2  12.0 - 15.0 g/dL Final  . HCT 08/11/2014 36.2  36.0 - 46.0 % Final  . MCV 08/11/2014 78.7  78.0 - 100.0 fL Final  . MCH 08/11/2014 26.5  26.0 - 34.0 pg Final  . MCHC 08/11/2014 33.7  30.0 - 36.0 g/dL Final  . RDW 08/11/2014 14.8  11.5 - 15.5 % Final  . Platelets 08/11/2014 133* 150 - 400 K/uL Final   Comment: SPECIMEN CHECKED FOR CLOTS REPEATED TO VERIFY   . Glucose-Capillary 08/10/2014 220* 70 - 99 mg/dL Final  . Glucose-Capillary 08/11/2014 281* 70 - 99 mg/dL Final  . Glucose-Capillary 08/11/2014 162* 70 - 99 mg/dL Final  . Glucose-Capillary 08/11/2014 90  70 - 99 mg/dL Final  . Glucose-Capillary 08/11/2014 93  70 - 99 mg/dL Final  . Glucose-Capillary 08/11/2014 210* 70 - 99 mg/dL Final  . Glucose-Capillary 08/11/2014 231* 70 - 99 mg/dL Final  . Glucose-Capillary 08/12/2014 76  70 - 99 mg/dL Final  . Glucose-Capillary 08/12/2014 178* 70 - 99 mg/dL Final    Dg Abd Acute W/chest  11/01/2014   CLINICAL DATA:  Shortness of breath and diaphoresis. Hyperglycemia. Chest pain.  EXAM: DG ABDOMEN ACUTE W/ 1V CHEST  COMPARISON:  09/14/2014 and abdominal CT 08/13/2011  FINDINGS: Both lungs are clear. Normal appearance of the heart and mediastinum. The trachea is midline. Negative for free air. Cholecystectomy clips in the right upper quadrant of the abdomen. There is a nonspecific bowel gas pattern. Moderate amount of stool. Again noted are sclerotic foci in the iliac wings suggestive for bone islands.  IMPRESSION: Negative abdominal radiographs.  No acute cardiopulmonary disease.   Electronically Signed   By: Markus Daft M.D.   On: 11/01/2014 12:38     Assessment/Plan   ICD-9-CM ICD-10-CM   1.  Constipation, unspecified constipation type 564.00 K59.00   2. Expressive aphasia 784.3 R47.01   3. Orthostatic hypotension 458.0 I95.1   4. Type 2 diabetes mellitus with diabetic neuropathy 250.60 E11.40    357.2    5. Spinal stenosis, multilevel 724.00 M48.00   6. Asthma, unspecified asthma severity, uncomplicated 419.37 T02.409   7. Blindness 369.00 H54.0  8. Hypothyroidism due to acquired atrophy of thyroid 244.8 E03.8    246.8 E03.4   9. Hyperlipidemia LDL goal <70 272.4 E78.5      Patient is being discharged with home health services: home care aid. PCP to determine need for home PT   Patient is being discharged with the following durable medical equipment:  None   Patient has been advised to f/u with their PCP in 1-2 weeks to bring them up to date on their rehab stay.  They were provided with a 30 day supply of scripts for prescription medications and refills must be obtained from their PCP.  TIME SPENT (MINUTES): [place]. Perlie Gold  [facility] [address] [place], [place] [address] [phone] Cell (Monday-Friday 8 AM - 5 PM) [phone] After 5 PM and follow prompts

## 2015-01-16 ENCOUNTER — Encounter (HOSPITAL_COMMUNITY): Payer: Self-pay

## 2015-01-16 ENCOUNTER — Emergency Department (HOSPITAL_COMMUNITY)
Admission: EM | Admit: 2015-01-16 | Discharge: 2015-01-16 | Disposition: A | Discharge status: Home / Self Care | Payer: Medicare Other | Attending: Emergency Medicine | Admitting: Emergency Medicine

## 2015-01-16 DIAGNOSIS — K5900 Constipation, unspecified: Secondary | ICD-10-CM | POA: Insufficient documentation

## 2015-01-16 DIAGNOSIS — R42 Dizziness and giddiness: Secondary | ICD-10-CM

## 2015-01-16 DIAGNOSIS — E1140 Type 2 diabetes mellitus with diabetic neuropathy, unspecified: Secondary | ICD-10-CM | POA: Diagnosis not present

## 2015-01-16 DIAGNOSIS — I10 Essential (primary) hypertension: Secondary | ICD-10-CM | POA: Diagnosis not present

## 2015-01-16 DIAGNOSIS — M549 Dorsalgia, unspecified: Secondary | ICD-10-CM | POA: Insufficient documentation

## 2015-01-16 DIAGNOSIS — M542 Cervicalgia: Secondary | ICD-10-CM | POA: Insufficient documentation

## 2015-01-16 DIAGNOSIS — J45909 Unspecified asthma, uncomplicated: Secondary | ICD-10-CM | POA: Diagnosis not present

## 2015-01-16 DIAGNOSIS — Z79899 Other long term (current) drug therapy: Secondary | ICD-10-CM | POA: Insufficient documentation

## 2015-01-16 DIAGNOSIS — R5383 Other fatigue: Secondary | ICD-10-CM | POA: Diagnosis present

## 2015-01-16 DIAGNOSIS — Z7982 Long term (current) use of aspirin: Secondary | ICD-10-CM | POA: Diagnosis not present

## 2015-01-16 DIAGNOSIS — R358 Other polyuria: Secondary | ICD-10-CM | POA: Insufficient documentation

## 2015-01-16 DIAGNOSIS — Z880 Allergy status to penicillin: Secondary | ICD-10-CM | POA: Insufficient documentation

## 2015-01-16 DIAGNOSIS — E785 Hyperlipidemia, unspecified: Secondary | ICD-10-CM | POA: Insufficient documentation

## 2015-01-16 DIAGNOSIS — Z794 Long term (current) use of insulin: Secondary | ICD-10-CM | POA: Diagnosis not present

## 2015-01-16 DIAGNOSIS — R350 Frequency of micturition: Secondary | ICD-10-CM | POA: Diagnosis not present

## 2015-01-16 DIAGNOSIS — H540 Blindness, both eyes: Secondary | ICD-10-CM | POA: Diagnosis not present

## 2015-01-16 DIAGNOSIS — E119 Type 2 diabetes mellitus without complications: Secondary | ICD-10-CM | POA: Diagnosis not present

## 2015-01-16 DIAGNOSIS — E039 Hypothyroidism, unspecified: Secondary | ICD-10-CM | POA: Insufficient documentation

## 2015-01-16 DIAGNOSIS — Z87891 Personal history of nicotine dependence: Secondary | ICD-10-CM | POA: Insufficient documentation

## 2015-01-16 DIAGNOSIS — Z8739 Personal history of other diseases of the musculoskeletal system and connective tissue: Secondary | ICD-10-CM | POA: Diagnosis not present

## 2015-01-16 LAB — CBC
HCT: 39.9 % (ref 36.0–46.0)
HEMOGLOBIN: 13.2 g/dL (ref 12.0–15.0)
MCH: 26.2 pg (ref 26.0–34.0)
MCHC: 33.1 g/dL (ref 30.0–36.0)
MCV: 79.3 fL (ref 78.0–100.0)
PLATELETS: 192 10*3/uL (ref 150–400)
RBC: 5.03 MIL/uL (ref 3.87–5.11)
RDW: 14.4 % (ref 11.5–15.5)
WBC: 8.3 10*3/uL (ref 4.0–10.5)

## 2015-01-16 LAB — BASIC METABOLIC PANEL
Anion gap: 8 (ref 5–15)
BUN: 26 mg/dL — ABNORMAL HIGH (ref 6–20)
CHLORIDE: 108 mmol/L (ref 101–111)
CO2: 27 mmol/L (ref 22–32)
CREATININE: 0.9 mg/dL (ref 0.44–1.00)
Calcium: 9.6 mg/dL (ref 8.9–10.3)
Glucose, Bld: 56 mg/dL — ABNORMAL LOW (ref 65–99)
POTASSIUM: 4.2 mmol/L (ref 3.5–5.1)
SODIUM: 143 mmol/L (ref 135–145)

## 2015-01-16 LAB — URINALYSIS, ROUTINE W REFLEX MICROSCOPIC
Bilirubin Urine: NEGATIVE
HGB URINE DIPSTICK: NEGATIVE
KETONES UR: NEGATIVE mg/dL
Leukocytes, UA: NEGATIVE
Nitrite: NEGATIVE
PROTEIN: NEGATIVE mg/dL
Specific Gravity, Urine: 1.019 (ref 1.005–1.030)
UROBILINOGEN UA: 0.2 mg/dL (ref 0.0–1.0)
pH: 5 (ref 5.0–8.0)

## 2015-01-16 LAB — I-STAT CHEM 8, ED
BUN: 28 mg/dL — AB (ref 6–20)
CALCIUM ION: 1.18 mmol/L (ref 1.12–1.23)
CHLORIDE: 108 mmol/L (ref 101–111)
CREATININE: 0.9 mg/dL (ref 0.44–1.00)
GLUCOSE: 199 mg/dL — AB (ref 65–99)
HCT: 37 % (ref 36.0–46.0)
Hemoglobin: 12.6 g/dL (ref 12.0–15.0)
POTASSIUM: 3.9 mmol/L (ref 3.5–5.1)
Sodium: 146 mmol/L — ABNORMAL HIGH (ref 135–145)
TCO2: 24 mmol/L (ref 0–100)

## 2015-01-16 LAB — URINE MICROSCOPIC-ADD ON

## 2015-01-16 LAB — CBG MONITORING, ED: GLUCOSE-CAPILLARY: 204 mg/dL — AB (ref 65–99)

## 2015-01-16 MED ORDER — SODIUM CHLORIDE 0.9 % IV BOLUS (SEPSIS)
1000.0000 mL | Freq: Once | INTRAVENOUS | Status: AC
  Administered 2015-01-16: 1000 mL via INTRAVENOUS

## 2015-01-16 MED ORDER — DIPHENHYDRAMINE HCL 50 MG/ML IJ SOLN
12.5000 mg | Freq: Once | INTRAMUSCULAR | Status: AC
  Administered 2015-01-16: 12.5 mg via INTRAVENOUS
  Filled 2015-01-16: qty 1

## 2015-01-16 MED ORDER — MECLIZINE HCL 25 MG PO TABS
12.5000 mg | ORAL_TABLET | Freq: Once | ORAL | Status: AC
  Administered 2015-01-16: 12.5 mg via ORAL
  Filled 2015-01-16: qty 1

## 2015-01-16 MED ORDER — METOCLOPRAMIDE HCL 5 MG/ML IJ SOLN
10.0000 mg | Freq: Once | INTRAMUSCULAR | Status: AC
  Administered 2015-01-16: 10 mg via INTRAVENOUS
  Filled 2015-01-16: qty 2

## 2015-01-16 MED ORDER — NAPROXEN 250 MG PO TABS
500.0000 mg | ORAL_TABLET | Freq: Two times a day (BID) | ORAL | Status: DC
  Administered 2015-01-16: 500 mg via ORAL
  Filled 2015-01-16: qty 2

## 2015-01-16 NOTE — ED Provider Notes (Signed)
CSN: [id number]     Arrival date & time 01/16/15  1019 History   First MD Initiated Contact with Patient 01/16/15 1038     Chief Complaint  Patient presents with  . Fatigue    (Consider location/radiation/quality/duration/timing/severity/associated sxs/prior Treatment) HPI   Amy [name] is a 54 year old female with type 1 diabetes, hypertension, hyperlipidemia, spinal stenosis and legally blind with diabetic neuropathy, she was brought to the ER today after her home health nurse called EMS for hypotension.  At the home the patient's reported blood pressure meter was reading systolic blood pressure in the 80s, however EMS recorded a blood pressure of 116/88.  The patient states that she recently had to resign from her job due to frequent loss of balance and fall risk,  has been home for the past several weeks and is feeling depressed and is having worsening chronic back pain.  She states that is the cause of her lack of energy and appearance of fatigue to her home health nurse.  She denies any acute changes today but her "leg give out" from under her - which has been occuring for several weeks- and she does endorsemild lightheadedness with positional changes.  She notes that she has had no bowel movement in several days, does have a history of gastroparesis, and has been urinating frequently at night, and does drink plenty of water with her diabetes medications as instructed.  She does feel some bloating to her abdomen, does not complain of any abdominal pain. And does not have any shortness of breath, chest pain, fever, or chills. She reports several months of problems with her ambulation, multiple falls and loss of balance issues. She does complain of worsening chronic back pain, she is to good pain management and does not have an orthopedic surgeon or spinal surgeon that she sees.  Past Medical History  Diagnosis Date  . Asthma   . Diabetes mellitus   . Hypertension   . Blind   .  Hypothyroidism   . Hyperlipidemia   . Diabetic neuropathy   . Blindness and low vision     left eye glass eye,  legally blind in right eye  . Spinal stenosis    Past Surgical History  Procedure Laterality Date  . Cesarean section      x 2  . Abdominal hysterectomy    . Cholecystectomy    . Enucleation     Family History  Problem Relation Age of Onset  . Diabetes Sister    Social History  Substance Use Topics  . Smoking status: Former Smoker    Quit date: 05/22/1992  . Smokeless tobacco: Never Used  . Alcohol Use: No   OB History    Obstetric Comments   Pt has two sons.     Review of Systems  Constitutional: Positive for fatigue. Negative for fever, chills, diaphoresis, activity change and appetite change.  HENT: Negative.   Respiratory: Negative.   Cardiovascular: Negative.   Gastrointestinal: Positive for constipation and abdominal distention. Negative for nausea, vomiting, abdominal pain, diarrhea, blood in stool and rectal pain.  Endocrine: Positive for polyuria.  Genitourinary: Positive for frequency. Negative for dysuria, urgency, hematuria, flank pain, decreased urine volume and difficulty urinating.  Musculoskeletal: Positive for back pain. Negative for myalgias, joint swelling, neck pain and neck stiffness.  Skin: Negative.   Neurological: Positive for dizziness, weakness and light-headedness. Negative for tremors, syncope, facial asymmetry, numbness and headaches.  Psychiatric/Behavioral: Positive for dysphoric mood. Negative for suicidal ideas. The  patient is not nervous/anxious.    Allergies  Codeine; Iohexol; Penicillins; and Sulfa antibiotics  Home Medications   Prior to Admission medications   Medication Sig Start Date End Date Taking? Authorizing Provider  albuterol (PROVENTIL HFA;VENTOLIN HFA) 108 (90 BASE) MCG/ACT inhaler Inhale 2 puffs into the lungs every 4 (four) hours as needed. For shortness of breath   Yes Historical Provider, MD  aspirin EC  81 MG tablet Take 81 mg by mouth daily.   Yes Historical Provider, MD  clobetasol cream (TEMOVATE) [id number] % Apply 1 application topically 2 (two) times daily.   Yes Historical Provider, MD  diclofenac sodium (VOLTAREN) 1 % GEL Apply 2 g topically every 6 (six) hours as needed (for pain). 11/03/14  Yes Theodis Blaze, MD  Diclofenac Sodium 1.5 % SOLN Place 1 each onto the skin daily as needed (pain).   Yes Historical Provider, MD  Dulaglutide (TRULICITY [place]) Inject 1.7 mLs into the skin once a week. Takes on Sunday   Yes Historical Provider, MD  empagliflozin (JARDIANCE) 10 MG TABS tablet Take 10 mg by mouth daily.   Yes Historical Provider, MD  esomeprazole (NEXIUM) 40 MG capsule Take 40 mg by mouth every morning.    Yes Historical Provider, MD  HUMALOG KWIKPEN 100 UNIT/ML KiwkPen Inject 5 Units into the skin 3 (three) times daily.  12/26/13  Yes Historical Provider, MD  insulin detemir (LEVEMIR) 100 unit/ml SOLN Inject 0.3 mLs (30 Units total) into the skin at bedtime. Patient taking differently: Inject 35 Units into the skin at bedtime.  08/12/14  Yes Geradine Girt, DO  levothyroxine (SYNTHROID, LEVOTHROID) 150 MCG tablet Take 150 mcg by mouth daily. 10/28/14  Yes Historical Provider, MD  Linaclotide Rolan Lipa) 145 MCG CAPS capsule Take 145 mcg by mouth every morning.   Yes Historical Provider, MD  LYRICA 150 MG capsule Take 150 mg by mouth 3 (three) times daily. 10/28/14  Yes Historical Provider, MD  midodrine (PROAMATINE) 5 MG tablet Take 5 mg by mouth 3 (three) times daily with meals.    Yes Historical Provider, MD  montelukast (SINGULAIR) 10 MG tablet Take 10 mg by mouth at bedtime.   Yes Historical Provider, MD  naproxen sodium (ANAPROX) 220 MG tablet Take 220 mg by mouth 2 (two) times daily as needed (pain).   Yes Historical Provider, MD  polyethylene glycol powder (GLYCOLAX/MIRALAX) powder Take 17 g by mouth daily. 12/16/13  Yes Historical Provider, MD  simvastatin (ZOCOR) 20 MG tablet Take 20 mg by  mouth at bedtime.    Yes Historical Provider, MD  furosemide (LASIX) 20 MG tablet Take 1 tablet (20 mg total) by mouth daily as needed for edema. Patient not taking: Reported on 01/16/2015 11/03/14   Theodis Blaze, MD   BP 110/66 mmHg  Pulse 77  Temp(Src) 98.5 F (36.9 C) (Oral)  Resp 12  Ht 4\' 11"  (1.499 m)  Wt 162 lb (73.483 kg)  BMI 32.70 kg/m2  SpO2 97%   Physical Exam  Constitutional: She is oriented to person, place, and time. She appears well-developed and well-nourished. No distress.  Patient appears older than stated age, chronically ill female, in no acute distress  HENT:  Head: Normocephalic and atraumatic.  Nose: Nose normal.  Mouth/Throat: Oropharynx is clear and moist. No oropharyngeal exudate.  Pt is blind with glass left eye, right eye blindness with grossly normal EOM  Eyes: Conjunctivae are normal. Right eye exhibits no discharge. Left eye exhibits no discharge. No scleral icterus.  Neck: Normal range of motion. No JVD present. No tracheal deviation present. No thyromegaly present.  Cardiovascular: Normal rate, regular rhythm, normal heart sounds and intact distal pulses.  Exam reveals no gallop and no friction rub.   No murmur heard. Pulmonary/Chest: Effort normal and breath sounds normal. No respiratory distress. She has no wheezes. She has no rales. She exhibits no tenderness.  Abdominal: Soft. She exhibits no mass. There is no tenderness. There is no rebound and no guarding.  Mildly distended abdomen, decreased bowel sounds throughout, no rebound, no guarding  Musculoskeletal: Normal range of motion. She exhibits tenderness. She exhibits no edema.  Diffuse posterior neck and back pain, ttp  Lymphadenopathy:    She has no cervical adenopathy.  Neurological: She is alert and oriented to person, place, and time.  Speech is clear and goal oriented, follows commands No facial droop, normal strength in upper and lower extremities bilaterally including dorsiflexion and  plantar flexion, strong and equal grip strength Sensation normal to light touch Moves extremities without ataxia  Skin: Skin is warm and dry. No rash noted. She is not diaphoretic. No erythema. No pallor.  Psychiatric: She has a normal mood and affect. Her behavior is normal. Judgment and thought content normal.  Nursing note and vitals reviewed.   ED Course  Procedures (including critical care time) Labs Review Labs Reviewed  BASIC METABOLIC PANEL - Abnormal; Notable for the following:    Glucose, Bld 56 (*)    BUN 26 (*)    All other components within normal limits  URINALYSIS, ROUTINE W REFLEX MICROSCOPIC (NOT AT [facility]) - Abnormal; Notable for the following:    APPearance CLOUDY (*)    Glucose, UA >1000 (*)    All other components within normal limits  URINE MICROSCOPIC-ADD ON - Abnormal; Notable for the following:    Squamous Epithelial / LPF MANY (*)    All other components within normal limits  CBG MONITORING, ED - Abnormal; Notable for the following:    Glucose-Capillary 204 (*)    All other components within normal limits  I-STAT CHEM 8, ED - Abnormal; Notable for the following:    Sodium 146 (*)    BUN 28 (*)    Glucose, Bld 199 (*)    All other components within normal limits  CBC    Imaging Review No results found. I have personally reviewed and evaluated these images and lab results as part of my medical decision-making.   EKG Interpretation   Date/Time:  Friday January 16 2015 11:23:45 EDT Ventricular Rate:  72 PR Interval:  125 QRS Duration: 77 QT Interval:  404 QTC Calculation: 442 R Axis:   53 Text Interpretation:  Normal sinus rhythm septal q waves new from first  prior ekg of 11/01/14 Confirmed by RAY MD, Andee Poles [phone]) on 01/16/2015  4:39:15 PM      MDM   Final diagnoses:  Orthostatic lightheadedness    Pt was brought to the ER by her home health nurse who noted hypotension, patient initially had no complaints, but then complained of  several months of loss of balance, "legs going out on her", lightheadedness, depression, abdominal fullness with hx of gastroparesis  Basic labs, urine, ekg, and orthostatics obtained - pt was orthostatic, labs significant for elevated BUN, urine significant for glucosuria, secondary to Jardiance, likely contributory to her orthostatic hypotension and symptoms of nocturia and polyuria. She complains of vertigo symptoms without any neurological deficits on exam.   Orthostatics were repeated after receiving IVF,  she reported feeling much better. She was given Reglan for her gastroparesis. And she will be given a trial of meclizine here in the ER for "dizziness", and follow-up with her primary care physician, further feel she can safely d/c home with chronic symptoms and with home health nurses daily in the home. Due to her several month history of vertigo, recent head CT in July without any acute pathology, there is no indication for further workup or treatment today here in the ER, and can be followed up outpatient with her doctor.    Filed Vitals:   01/16/15 1600 01/16/15 1630 01/16/15 1645 01/16/15 1700  BP: 111/86 101/62 117/64 110/66  Pulse: 74 78 80 77  Temp:      TempSrc:      Resp: 12 20  12   Height:      Weight:      SpO2: 100% 97% 98% 97%    Medications  sodium chloride 0.9 % bolus 1,000 mL (0 mLs Intravenous Stopped 01/16/15 1500)  metoCLOPramide (REGLAN) injection 10 mg (10 mg Intravenous Given 01/16/15 1226)  diphenhydrAMINE (BENADRYL) injection 12.5 mg (12.5 mg Intravenous Given 01/16/15 1226)  meclizine (ANTIVERT) tablet 12.5 mg (12.5 mg Oral Given 01/16/15 1600)     Delsa Grana, PA-C 01/19/15 1031  Pattricia Boss, MD 01/25/15 [id number]

## 2015-01-16 NOTE — Discharge Instructions (Signed)
Hypotension As your heart beats, it forces blood through your body. This force is called blood pressure. If you have hypotension, you have low blood pressure. When your blood pressure is too low, you may not get enough blood to your brain. You may feel weak, feel lightheaded, have a fast heartbeat, or even pass out (faint). HOME CARE  Drink enough fluids to keep your pee (urine) clear or pale yellow.  Take all medicines as told by your doctor.  Get up slowly after sitting or lying down.  Wear support stockings as told by your doctor.  Maintain a healthy diet by including foods such as fruits, vegetables, nuts, whole grains, and lean meats. GET HELP IF:  You are throwing up (vomiting) or have watery poop (diarrhea).  You have a fever for more than 2-3 days.  You feel more thirsty than usual.  You feel weak and tired. GET HELP RIGHT AWAY IF:   You pass out (faint).  You have chest pain or a fast or irregular heartbeat.  You lose feeling in part of your body.  You cannot move your arms or legs.  You have trouble speaking.  You get sweaty or feel lightheaded. MAKE SURE YOU:   Understand these instructions.  Will watch your condition.  Will get help right away if you are not doing well or get worse. Document Released: 07/27/2009 Document Revised: 01/02/2013 Document Reviewed: 11/02/2012 [employer] Patient Information 2015 [employer]. This information is not intended to replace advice given to you by your health care provider. Make sure you discuss any questions you have with your health care provider.  

## 2015-01-16 NOTE — ED Notes (Addendum)
Pt. Presents with complaint of fatigue beginning yesterday, and some nausea that began today. Pt. Followed by home health and they wanted her to come for evaluation of lethargy. BP 116/88. Pt. AxO x4. Pt. Ambulatory to EMS stretcher. Pt. Denies fever/chills/cough. Pt. Is legally blind. EMS states she passed stroke screen. Pt. States she may be more lethargic because of some depression.

## 2015-01-20 ENCOUNTER — Ambulatory Visit (INDEPENDENT_AMBULATORY_CARE_PROVIDER_SITE_OTHER): Payer: Medicare Other | Admitting: Podiatry

## 2015-01-20 ENCOUNTER — Encounter: Payer: Self-pay | Admitting: Podiatry

## 2015-01-20 DIAGNOSIS — M79676 Pain in unspecified toe(s): Secondary | ICD-10-CM

## 2015-01-20 DIAGNOSIS — B351 Tinea unguium: Secondary | ICD-10-CM | POA: Diagnosis not present

## 2015-01-20 NOTE — Patient Instructions (Signed)
Diabetes and Foot Care Diabetes may cause you to have problems because of poor blood supply (circulation) to your feet and legs. This may cause the skin on your feet to become thinner, break easier, and heal more slowly. Your skin may become dry, and the skin may peel and crack. You may also have nerve damage in your legs and feet causing decreased feeling in them. You may not notice minor injuries to your feet that could lead to infections or more serious problems. Taking care of your feet is one of the most important things you can do for yourself.  HOME CARE INSTRUCTIONS  Wear shoes at all times, even in the house. Do not go barefoot. Bare feet are easily injured.  Check your feet daily for blisters, cuts, and redness. If you cannot see the bottom of your feet, use a mirror or ask someone for help.  Wash your feet with warm water (do not use hot water) and mild soap. Then pat your feet and the areas between your toes until they are completely dry. Do not soak your feet as this can dry your skin.  Apply a moisturizing lotion or petroleum jelly (that does not contain alcohol and is unscented) to the skin on your feet and to dry, brittle toenails. Do not apply lotion between your toes.  Trim your toenails straight across. Do not dig under them or around the cuticle. File the edges of your nails with an emery board or nail file.  Do not cut corns or calluses or try to remove them with medicine.  Wear clean socks or stockings every day. Make sure they are not too tight. Do not wear knee-high stockings since they may decrease blood flow to your legs.  Wear shoes that fit properly and have enough cushioning. To break in new shoes, wear them for just a few hours a day. This prevents you from injuring your feet. Always look in your shoes before you put them on to be sure there are no objects inside.  Do not cross your legs. This may decrease the blood flow to your feet.  If you find a minor scrape,  cut, or break in the skin on your feet, keep it and the skin around it clean and dry. These areas may be cleansed with mild soap and water. Do not cleanse the area with peroxide, alcohol, or iodine.  When you remove an adhesive bandage, be sure not to damage the skin around it.  If you have a wound, look at it several times a day to make sure it is healing.  Do not use heating pads or hot water bottles. They may burn your skin. If you have lost feeling in your feet or legs, you may not know it is happening until it is too late.  Make sure your health care provider performs a complete foot exam at least annually or more often if you have foot problems. Report any cuts, sores, or bruises to your health care provider immediately. SEEK MEDICAL CARE IF:   You have an injury that is not healing.  You have cuts or breaks in the skin.  You have an ingrown nail.  You notice redness on your legs or feet.  You feel burning or tingling in your legs or feet.  You have pain or cramps in your legs and feet.  Your legs or feet are numb.  Your feet always feel cold. SEEK IMMEDIATE MEDICAL CARE IF:   There is increasing redness,   swelling, or pain in or around a wound.  There is a red line that goes up your leg.  Pus is coming from a wound.  You develop a fever or as directed by your health care provider.  You notice a bad smell coming from an ulcer or wound. Document Released: 04/29/2000 Document Revised: 01/02/2013 Document Reviewed: 10/09/2012 [employer] Patient Information 2015 [employer]. This information is not intended to replace advice given to you by your health care provider. Make sure you discuss any questions you have with your health care provider.  

## 2015-01-20 NOTE — Progress Notes (Signed)
Patient ID: Amy [name], female   DOB: September 08, 1960, 54 y.o.   MRN: [id number]  Subjective: As patient presents today for scheduled visit complaining of painful toenails and requesting nail debridement  Objective: The toenails elongated, brittle, incurvated and tender to direct palpation  Assessment: Symptomatic onychomycoses 6-10 Diabetic peripheral neuropathy  Plan: Debridement toenails 10 and mechanically and electrically without a bleeding  Reappoint 3 months

## 2015-01-21 ENCOUNTER — Ambulatory Visit: Payer: Medicare Other | Admitting: Podiatry

## 2015-03-13 ENCOUNTER — Other Ambulatory Visit: Payer: Self-pay | Admitting: Otolaryngology

## 2015-03-13 DIAGNOSIS — R1313 Dysphagia, pharyngeal phase: Secondary | ICD-10-CM

## 2015-03-20 ENCOUNTER — Ambulatory Visit
Admission: RE | Admit: 2015-03-20 | Discharge: 2015-03-20 | Disposition: A | Discharge status: Home / Self Care | Payer: Medicare Other | Source: Ambulatory Visit | Attending: Otolaryngology | Admitting: Otolaryngology

## 2015-03-20 DIAGNOSIS — R1313 Dysphagia, pharyngeal phase: Secondary | ICD-10-CM

## 2015-04-17 ENCOUNTER — Emergency Department (HOSPITAL_COMMUNITY)
Admission: EM | Admit: 2015-04-17 | Discharge: 2015-04-17 | Disposition: A | Discharge status: Home / Self Care | Payer: Medicare Other | Attending: Emergency Medicine | Admitting: Emergency Medicine

## 2015-04-17 ENCOUNTER — Encounter (HOSPITAL_COMMUNITY): Payer: Self-pay | Admitting: Emergency Medicine

## 2015-04-17 DIAGNOSIS — Z7984 Long term (current) use of oral hypoglycemic drugs: Secondary | ICD-10-CM | POA: Diagnosis not present

## 2015-04-17 DIAGNOSIS — E039 Hypothyroidism, unspecified: Secondary | ICD-10-CM | POA: Insufficient documentation

## 2015-04-17 DIAGNOSIS — E1140 Type 2 diabetes mellitus with diabetic neuropathy, unspecified: Secondary | ICD-10-CM | POA: Diagnosis not present

## 2015-04-17 DIAGNOSIS — Z87891 Personal history of nicotine dependence: Secondary | ICD-10-CM | POA: Insufficient documentation

## 2015-04-17 DIAGNOSIS — Z7982 Long term (current) use of aspirin: Secondary | ICD-10-CM | POA: Insufficient documentation

## 2015-04-17 DIAGNOSIS — M6281 Muscle weakness (generalized): Secondary | ICD-10-CM | POA: Insufficient documentation

## 2015-04-17 DIAGNOSIS — Z880 Allergy status to penicillin: Secondary | ICD-10-CM | POA: Diagnosis not present

## 2015-04-17 DIAGNOSIS — E785 Hyperlipidemia, unspecified: Secondary | ICD-10-CM | POA: Diagnosis not present

## 2015-04-17 DIAGNOSIS — Z79899 Other long term (current) drug therapy: Secondary | ICD-10-CM | POA: Diagnosis not present

## 2015-04-17 DIAGNOSIS — R7989 Other specified abnormal findings of blood chemistry: Secondary | ICD-10-CM

## 2015-04-17 DIAGNOSIS — J45909 Unspecified asthma, uncomplicated: Secondary | ICD-10-CM | POA: Diagnosis not present

## 2015-04-17 DIAGNOSIS — R531 Weakness: Secondary | ICD-10-CM

## 2015-04-17 DIAGNOSIS — I10 Essential (primary) hypertension: Secondary | ICD-10-CM | POA: Diagnosis not present

## 2015-04-17 DIAGNOSIS — H540 Blindness, both eyes: Secondary | ICD-10-CM | POA: Diagnosis not present

## 2015-04-17 DIAGNOSIS — Z794 Long term (current) use of insulin: Secondary | ICD-10-CM | POA: Diagnosis not present

## 2015-04-17 DIAGNOSIS — R5383 Other fatigue: Secondary | ICD-10-CM | POA: Insufficient documentation

## 2015-04-17 LAB — URINALYSIS, ROUTINE W REFLEX MICROSCOPIC
Bilirubin Urine: NEGATIVE
Hgb urine dipstick: NEGATIVE
KETONES UR: NEGATIVE mg/dL
LEUKOCYTES UA: NEGATIVE
NITRITE: NEGATIVE
PROTEIN: 30 mg/dL — AB
Specific Gravity, Urine: 1.017 (ref 1.005–1.030)
pH: 6.5 (ref 5.0–8.0)

## 2015-04-17 LAB — CBC
HCT: 38.4 % (ref 36.0–46.0)
Hemoglobin: 12.8 g/dL (ref 12.0–15.0)
MCH: 25.9 pg — ABNORMAL LOW (ref 26.0–34.0)
MCHC: 33.3 g/dL (ref 30.0–36.0)
MCV: 77.7 fL — AB (ref 78.0–100.0)
PLATELETS: 238 10*3/uL (ref 150–400)
RBC: 4.94 MIL/uL (ref 3.87–5.11)
RDW: 14.5 % (ref 11.5–15.5)
WBC: 8.6 10*3/uL (ref 4.0–10.5)

## 2015-04-17 LAB — BASIC METABOLIC PANEL
Anion gap: 8 (ref 5–15)
BUN: 32 mg/dL — AB (ref 6–20)
CALCIUM: 9.6 mg/dL (ref 8.9–10.3)
CHLORIDE: 105 mmol/L (ref 101–111)
CO2: 32 mmol/L (ref 22–32)
CREATININE: 1.34 mg/dL — AB (ref 0.44–1.00)
GFR, EST AFRICAN AMERICAN: 50 mL/min — AB (ref >60)
GFR, EST NON AFRICAN AMERICAN: 43 mL/min — AB (ref >60)
Glucose, Bld: 122 mg/dL — ABNORMAL HIGH (ref 65–99)
Potassium: 4.2 mmol/L (ref 3.5–5.1)
SODIUM: 145 mmol/L (ref 135–145)

## 2015-04-17 LAB — URINE MICROSCOPIC-ADD ON

## 2015-04-17 LAB — CBG MONITORING, ED: GLUCOSE-CAPILLARY: 102 mg/dL — AB (ref 65–99)

## 2015-04-17 MED ORDER — SODIUM CHLORIDE 0.9 % IV BOLUS (SEPSIS)
1000.0000 mL | Freq: Once | INTRAVENOUS | Status: AC
  Administered 2015-04-17: 1000 mL via INTRAVENOUS

## 2015-04-17 NOTE — ED Notes (Addendum)
Per EMS pt complaint of weakness and hypotension en route with ems (86/54); pt reports hx of same when "potassium was off." Pt given 250 ml NaCl bolus en route with EMS.

## 2015-04-17 NOTE — Discharge Instructions (Signed)
Make sure to continue to drink plenty of fluids at home. Follow up with primary care doctor for recheck of your kidney function. Your levels were slightly elevated today. Return if worsening symptoms or new concerning symptoms.   Weakness Weakness is a lack of strength. It may be felt all over the body (generalized) or in one specific part of the body (focal). Some causes of weakness can be serious. You may need further medical evaluation, especially if you are elderly or you have a history of immunosuppression (such as chemotherapy or HIV), kidney disease, heart disease, or diabetes. CAUSES  Weakness can be caused by many different things, including:  Infection.  Physical exhaustion.  Internal bleeding or other blood loss that results in a lack of red blood cells (anemia).  Dehydration. This cause is more common in elderly people.  Side effects or electrolyte abnormalities from medicines, such as pain medicines or sedatives.  Emotional distress, anxiety, or depression.  Circulation problems, especially severe peripheral arterial disease.  Heart disease, such as rapid atrial fibrillation, bradycardia, or heart failure.  Nervous system disorders, such as Guillain-Barr syndrome, multiple sclerosis, or stroke. DIAGNOSIS  To find the cause of your weakness, your caregiver will take your history and perform a physical exam. Lab tests or X-rays may also be ordered, if needed. TREATMENT  Treatment of weakness depends on the cause of your symptoms and can vary greatly. HOME CARE INSTRUCTIONS   Rest as needed.  Eat a well-balanced diet.  Try to get some exercise every day.  Only take over-the-counter or prescription medicines as directed by your caregiver. SEEK MEDICAL CARE IF:   Your weakness seems to be getting worse or spreads to other parts of your body.  You develop new aches or pains. SEEK IMMEDIATE MEDICAL CARE IF:   You cannot perform your normal daily activities, such as  getting dressed and feeding yourself.  You cannot walk up and down stairs, or you feel exhausted when you do so.  You have shortness of breath or chest pain.  You have difficulty moving parts of your body.  You have weakness in only one area of the body or on only one side of the body.  You have a fever.  You have trouble speaking or swallowing.  You cannot control your bladder or bowel movements.  You have black or bloody vomit or stools. MAKE SURE YOU:  Understand these instructions.  Will watch your condition.  Will get help right away if you are not doing well or get worse.   This information is not intended to replace advice given to you by your health care provider. Make sure you discuss any questions you have with your health care provider.   Document Released: 05/02/2005 Document Revised: 11/01/2011 Document Reviewed: 07/01/2011 Elsevier Interactive Patient Education [employer].

## 2015-04-17 NOTE — ED Provider Notes (Signed)
CSN: [id number]     Arrival date & time 04/17/15  1018 History   First MD Initiated Contact with Patient 04/17/15 1032     Chief Complaint  Patient presents with  . Weakness     (Consider location/radiation/quality/duration/timing/severity/associated sxs/prior Treatment) HPI Amy [name] is a 54 y.o. female with history of diabetes, hypertension, blindness, cervical stenosis, presents to emergency department complaining of generalized weakness. Patient states she woke up this morning not feeling well. She states she is unsure what exactly was not feeling well, but states she checked her blood pressure was low. Per EMS, patient's blood pressure was 86/54. She was given 250 mg of normal saline. She states that she has had similar episode of low blood pressure when her potassium was too high. She denies any pain anywhere other than the chronic neck pain for which she is currently doing physical therapy. She denies any numbness or weakness in extremities. No fever or chills. Recently diagnosed with UTI, patient states approximately 3 weeks ago, finished all antibiotics. Denies any back pain. No urinary symptoms at this time. Did not take any medications for her symptoms prior to coming in.=  Past Medical History  Diagnosis Date  . Asthma   . Diabetes mellitus   . Hypertension   . Blind   . Hypothyroidism   . Hyperlipidemia   . Diabetic neuropathy ([place])   . Blindness and low vision     left eye glass eye,  legally blind in right eye  . Spinal stenosis    Past Surgical History  Procedure Laterality Date  . Cesarean section      x 2  . Abdominal hysterectomy    . Cholecystectomy    . Enucleation     Family History  Problem Relation Age of Onset  . Diabetes Sister    Social History  Substance Use Topics  . Smoking status: Former Smoker    Quit date: 05/22/1992  . Smokeless tobacco: Never Used  . Alcohol Use: No   OB History    Obstetric Comments   Pt has two sons.      Review of Systems  Constitutional: Positive for fatigue. Negative for fever and chills.  Respiratory: Negative for cough, chest tightness and shortness of breath.   Cardiovascular: Negative for chest pain, palpitations and leg swelling.  Gastrointestinal: Negative for nausea, vomiting, abdominal pain and diarrhea.  Genitourinary: Negative for dysuria, flank pain and pelvic pain.  Musculoskeletal: Negative for myalgias, arthralgias, neck pain and neck stiffness.  Skin: Negative for rash.  Neurological: Positive for weakness. Negative for dizziness and headaches.  All other systems reviewed and are negative.     Allergies  Codeine; Iohexol; Penicillins; and Sulfa antibiotics  Home Medications   Prior to Admission medications   Medication Sig Start Date End Date Taking? Authorizing Provider  albuterol (PROVENTIL HFA;VENTOLIN HFA) 108 (90 BASE) MCG/ACT inhaler Inhale 2 puffs into the lungs every 4 (four) hours as needed. For shortness of breath    Historical Provider, MD  aspirin EC 81 MG tablet Take 81 mg by mouth daily.    Historical Provider, MD  clobetasol cream (TEMOVATE) AB-123456789 % Apply 1 application topically 2 (two) times daily.    Historical Provider, MD  diclofenac sodium (VOLTAREN) 1 % GEL Apply 2 g topically every 6 (six) hours as needed (for pain). 11/03/14   Theodis Blaze, MD  Diclofenac Sodium 1.5 % SOLN Place 1 each onto the skin daily as needed (pain).  Historical Provider, MD  Dulaglutide (TRULICITY [place]) Inject 1.7 mLs into the skin once a week. Takes on Sunday    Historical Provider, MD  empagliflozin (JARDIANCE) 10 MG TABS tablet Take 10 mg by mouth daily.    Historical Provider, MD  esomeprazole (NEXIUM) 40 MG capsule Take 40 mg by mouth every morning.     Historical Provider, MD  furosemide (LASIX) 20 MG tablet Take 1 tablet (20 mg total) by mouth daily as needed for edema. 11/03/14   Theodis Blaze, MD  HUMALOG KWIKPEN 100 UNIT/ML KiwkPen Inject 5 Units into the skin 3  (three) times daily.  12/26/13   Historical Provider, MD  insulin detemir (LEVEMIR) 100 unit/ml SOLN Inject 0.3 mLs (30 Units total) into the skin at bedtime. Patient taking differently: Inject 35 Units into the skin at bedtime.  08/12/14   Geradine Girt, DO  levothyroxine (SYNTHROID, LEVOTHROID) 150 MCG tablet Take 150 mcg by mouth daily. 10/28/14   Historical Provider, MD  Linaclotide Rolan Lipa) 145 MCG CAPS capsule Take 145 mcg by mouth every morning.    Historical Provider, MD  LYRICA 150 MG capsule Take 150 mg by mouth 3 (three) times daily. 10/28/14   Historical Provider, MD  midodrine (PROAMATINE) 5 MG tablet Take 5 mg by mouth 3 (three) times daily with meals.     Historical Provider, MD  montelukast (SINGULAIR) 10 MG tablet Take 10 mg by mouth at bedtime.    Historical Provider, MD  naproxen sodium (ANAPROX) 220 MG tablet Take 220 mg by mouth 2 (two) times daily as needed (pain).    Historical Provider, MD  polyethylene glycol powder (GLYCOLAX/MIRALAX) powder Take 17 g by mouth daily. 12/16/13   Historical Provider, MD  simvastatin (ZOCOR) 20 MG tablet Take 20 mg by mouth at bedtime.     Historical Provider, MD   BP 124/76 mmHg  Pulse 95  Temp(Src) 97.9 F (36.6 C) (Oral)  Resp 18  Ht 4\' 11"  (1.499 m)  Wt 78.019 kg  BMI 34.72 kg/m2  SpO2 96% Physical Exam  Constitutional: She is oriented to person, place, and time. She appears well-developed and well-nourished. No distress.  HENT:  Head: Normocephalic.  Eyes:  blind  Neck: Normal range of motion. Neck supple.  Cardiovascular: Normal rate, regular rhythm and normal heart sounds.   Pulmonary/Chest: Effort normal and breath sounds normal. No respiratory distress. She has no wheezes. She has no rales.  Abdominal: Soft. Bowel sounds are normal. She exhibits no distension. There is no tenderness. There is no rebound.  Musculoskeletal: She exhibits no edema.  Neurological: She is alert and oriented to person, place, and time.  Skin: Skin  is warm and dry.  Psychiatric: She has a normal mood and affect. Her behavior is normal.  Nursing note and vitals reviewed.   ED Course  Procedures (including critical care time) Labs Review Labs Reviewed  CBC - Abnormal; Notable for the following:    MCV 77.7 (*)    MCH 25.9 (*)    All other components within normal limits  CBG MONITORING, ED - Abnormal; Notable for the following:    Glucose-Capillary 102 (*)    All other components within normal limits  BASIC METABOLIC PANEL  URINALYSIS, ROUTINE W REFLEX MICROSCOPIC (NOT AT [facility])    Imaging Review No results found. I have personally reviewed and evaluated these images and lab results as part of my medical decision-making.   EKG Interpretation   Date/Time:  Friday April 17 2015 10:33:47  EST Ventricular Rate:  69 PR Interval:  132 QRS Duration: 75 QT Interval:  407 QTC Calculation: 436 R Axis:   41 Text Interpretation:  Sinus rhythm Anteroseptal infarct, age indeterminate  No significant change since last tracing Confirmed by [facility]  MD,  Loree Fee ([id number]) on 04/17/2015 12:01:21 PM      MDM   Final diagnoses:  Weakness  Elevated serum creatinine    11:32 AM Pt seen and examined. Pt with generalized weakness. VS here normal. BP 124/76. Will check labs and UA. Will monitor and follow BP closely.   1:33 PM Blood pressure continues to be within normal. Lab work showed slightly bumped creatinine to 1.34 and BUN of 32. This is above normal for patient. Patient is also mildly orthostatic with mild drop in blood pressure upon standing. Heart rate remained the same. Patient hydrated with IV fluids. Ambulated in the hallway, with no difficulties. Patient denies any dizziness or lightheadedness while ambulating. She denies any complaints at this time. I think that patient is stable for discharge home. I explained to her that her kidney function was slightly abnormal and she will need close follow-up with her primary care  doctor for recheck. Patient voiced understanding.  Filed Vitals:   04/17/15 1025 04/17/15 1031 04/17/15 1151 04/17/15 1324  BP:  124/76 110/73 111/85  Pulse:  95 75 83  Temp:  97.9 F (36.6 C)    TempSrc:  Oral    Resp:  18 19 18   Height:  4\' 11"  (1.499 m)    Weight:  78.019 kg    SpO2: 98% 96% 100% 100%     Jeannett Senior, PA-C 04/17/15 [place], MD 04/20/15 [phone]

## 2015-04-17 NOTE — ED Notes (Signed)
102 CBG

## 2015-04-17 NOTE — ED Notes (Signed)
Upon assessment pt reports chronic right side neck pain; takes aleve without relief per normal. Pt blind neuro unremarkable otherwise.

## 2015-04-17 NOTE — ED Notes (Signed)
Orthostatic vital signs obtained before ambulation; pt ambulated in hallway with steady gait

## 2015-04-21 ENCOUNTER — Ambulatory Visit: Payer: Medicare Other | Admitting: Podiatry

## 2015-04-26 ENCOUNTER — Encounter (HOSPITAL_COMMUNITY): Payer: Self-pay | Admitting: Emergency Medicine

## 2015-04-26 ENCOUNTER — Emergency Department (HOSPITAL_COMMUNITY)
Admission: EM | Admit: 2015-04-26 | Discharge: 2015-04-26 | Disposition: A | Discharge status: Home / Self Care | Payer: Medicare Other | Attending: Emergency Medicine | Admitting: Emergency Medicine

## 2015-04-26 DIAGNOSIS — I10 Essential (primary) hypertension: Secondary | ICD-10-CM | POA: Diagnosis not present

## 2015-04-26 DIAGNOSIS — E1140 Type 2 diabetes mellitus with diabetic neuropathy, unspecified: Secondary | ICD-10-CM | POA: Insufficient documentation

## 2015-04-26 DIAGNOSIS — J45909 Unspecified asthma, uncomplicated: Secondary | ICD-10-CM | POA: Insufficient documentation

## 2015-04-26 DIAGNOSIS — Z880 Allergy status to penicillin: Secondary | ICD-10-CM | POA: Diagnosis not present

## 2015-04-26 DIAGNOSIS — Z87891 Personal history of nicotine dependence: Secondary | ICD-10-CM | POA: Insufficient documentation

## 2015-04-26 DIAGNOSIS — E039 Hypothyroidism, unspecified: Secondary | ICD-10-CM | POA: Insufficient documentation

## 2015-04-26 DIAGNOSIS — Z7982 Long term (current) use of aspirin: Secondary | ICD-10-CM | POA: Insufficient documentation

## 2015-04-26 DIAGNOSIS — H540 Blindness, both eyes: Secondary | ICD-10-CM | POA: Insufficient documentation

## 2015-04-26 DIAGNOSIS — Z794 Long term (current) use of insulin: Secondary | ICD-10-CM | POA: Diagnosis not present

## 2015-04-26 DIAGNOSIS — M542 Cervicalgia: Secondary | ICD-10-CM

## 2015-04-26 DIAGNOSIS — M4800 Spinal stenosis, site unspecified: Secondary | ICD-10-CM

## 2015-04-26 DIAGNOSIS — Z79899 Other long term (current) drug therapy: Secondary | ICD-10-CM | POA: Diagnosis not present

## 2015-04-26 DIAGNOSIS — E785 Hyperlipidemia, unspecified: Secondary | ICD-10-CM | POA: Diagnosis not present

## 2015-04-26 MED ORDER — HYDROCODONE-ACETAMINOPHEN 5-325 MG PO TABS: ORAL_TABLET | ORAL | Status: DC

## 2015-04-26 MED ORDER — HYDROCODONE-ACETAMINOPHEN 5-325 MG PO TABS
1.0000 | ORAL_TABLET | Freq: Once | ORAL | Status: AC
  Administered 2015-04-26: 1 via ORAL
  Filled 2015-04-26: qty 1

## 2015-04-26 MED ORDER — ONDANSETRON HCL 4 MG PO TABS: 4.0000 mg | ORAL_TABLET | Freq: Three times a day (TID) | ORAL | Status: DC | PRN

## 2015-04-26 MED ORDER — ONDANSETRON 4 MG PO TBDP
4.0000 mg | ORAL_TABLET | Freq: Once | ORAL | Status: AC
  Administered 2015-04-26: 4 mg via ORAL
  Filled 2015-04-26: qty 1

## 2015-04-26 MED ORDER — LIDOCAINE 5 % EX PTCH: 1.0000 | MEDICATED_PATCH | CUTANEOUS | Status: DC

## 2015-04-26 NOTE — ED Notes (Signed)
Pt c/o neck pain x 2 weeks.

## 2015-04-26 NOTE — ED Provider Notes (Signed)
CSN: [id number]     Arrival date & time 04/26/15  1143 History  By signing my name below, I, Amy [name], attest that this documentation has been prepared under the direction and in the presence of [employer], PA-C. Electronically Signed: Starleen [name] ED Scribe. 04/26/2015. 1:08 PM.    Chief Complaint  Patient presents with  . Neck Pain   The history is provided by the patient. No language interpreter was used.   HPI Comments: Amy [name] is a 54 y.o. female with hx of spinal stenosis who presents to the Emergency Department complaining of non-radiating, worsening right-sided neck pain onset 1 month ago; worse with ROM of neck.  She reports this episode is typical of her spinal stenosis.  She has previously been in [facility] therapy for this complaint without improvement.  She reports being followed by pain management who has prescribed trigger point (relief), Aleve (transient relief), and an unknown topical cream.  She does not use narcotics because they make her nauseous.  She denies upper extremity numbness/tingling, weakness.   Past Medical History  Diagnosis Date  . Asthma   . Diabetes mellitus   . Hypertension   . Blind   . Hypothyroidism   . Hyperlipidemia   . Diabetic neuropathy ([place])   . Blindness and low vision     left eye glass eye,  legally blind in right eye  . Spinal stenosis    Past Surgical History  Procedure Laterality Date  . Cesarean section      x 2  . Abdominal hysterectomy    . Cholecystectomy    . Enucleation     Family History  Problem Relation Age of Onset  . Diabetes Sister    Social History  Substance Use Topics  . Smoking status: Former Smoker    Quit date: 05/22/1992  . Smokeless tobacco: Never Used  . Alcohol Use: No   OB History    Obstetric Comments   Pt has two sons.     Review of Systems A complete 10 system review of systems was obtained and all systems are negative except as noted in the HPI and PMH.   Allergies   Codeine; Iohexol; Penicillins; and Sulfa antibiotics  Home Medications   Prior to Admission medications   Medication Sig Start Date End Date Taking? Authorizing Provider  albuterol (PROVENTIL HFA;VENTOLIN HFA) 108 (90 BASE) MCG/ACT inhaler Inhale 2 puffs into the lungs every 4 (four) hours as needed. For shortness of breath    Historical Provider, MD  aspirin EC 81 MG tablet Take 81 mg by mouth daily.    Historical Provider, MD  clobetasol cream (TEMOVATE) AB-123456789 % Apply 1 application topically 2 (two) times daily as needed (rash).     Historical Provider, MD  diclofenac sodium (VOLTAREN) 1 % GEL Apply 2 g topically every 6 (six) hours as needed (for pain). 11/03/14   Theodis Blaze, MD  Dulaglutide (TRULICITY [place]) Inject 1.7 mLs into the skin once a week. Takes on Sunday    Historical Provider, MD  esomeprazole (NEXIUM) 40 MG capsule Take 40 mg by mouth 2 (two) times daily before a meal.     Historical Provider, MD  furosemide (LASIX) 20 MG tablet Take 1 tablet (20 mg total) by mouth daily as needed for edema. 11/03/14   Theodis Blaze, MD  HUMALOG KWIKPEN 100 UNIT/ML KiwkPen Inject 5 Units into the skin 3 (three) times daily.  12/26/13   Historical Provider, MD  insulin detemir (  LEVEMIR) 100 unit/ml SOLN Inject 0.3 mLs (30 Units total) into the skin at bedtime. Patient taking differently: Inject 35 Units into the skin at bedtime.  08/12/14   Mechele Claude, DO  levothyroxine (SYNTHROID, LEVOTHROID) 150 MCG tablet Take 150 mcg by mouth daily. 10/28/14   Historical Provider, MD  Linaclotide Rolan Lipa) 145 MCG CAPS capsule Take 145 mcg by mouth daily as needed (constipation).     Historical Provider, MD  LYRICA 150 MG capsule Take 150 mg by mouth 3 (three) times daily. 10/28/14   Historical Provider, MD  midodrine (PROAMATINE) 5 MG tablet Take 5 mg by mouth 3 (three) times daily with meals.     Historical Provider, MD  montelukast (SINGULAIR) 10 MG tablet Take 10 mg by mouth at bedtime.    Historical  Provider, MD  naproxen sodium (ANAPROX) 220 MG tablet Take 220 mg by mouth 2 (two) times daily as needed (pain).    Historical Provider, MD  polyethylene glycol powder (GLYCOLAX/MIRALAX) powder Take 17 g by mouth daily as needed for moderate constipation.  12/16/13   Historical Provider, MD  simvastatin (ZOCOR) 20 MG tablet Take 20 mg by mouth at bedtime.     Historical Provider, MD   BP 120/57 mmHg  Pulse 75  Temp(Src) 98.2 F (36.8 C) (Oral)  Resp 18  Ht 4\' 11"  (1.499 m)  Wt 170 lb (77.111 kg)  BMI 34.32 kg/m2  SpO2 100% Physical Exam  Constitutional: She is oriented to person, place, and time. She appears well-developed and well-nourished. No distress.  HENT:  Head: Normocephalic and atraumatic.  Mouth/Throat: Oropharynx is clear and moist.  Eyes: Conjunctivae and EOM are normal. Pupils are equal, round, and reactive to light.  Neck: Neck supple. No tracheal deviation present.    Slight midline tenderness to palpation, patient has right paracervical tenderness to palpation.  Grip strength, biceps, triceps 5/5 bilaterally;  can differentiate between pinprick and light touch bilaterally.   Cardiovascular: Normal rate, regular rhythm and intact distal pulses.   Pulmonary/Chest: Effort normal and breath sounds normal. No respiratory distress. She has no wheezes. She has no rales. She exhibits no tenderness.  Abdominal: Soft. Bowel sounds are normal.  Musculoskeletal: Normal range of motion.  Neurological: She is alert and oriented to person, place, and time.  Skin: Skin is warm and dry.  Psychiatric: She has a normal mood and affect. Her behavior is normal.  Nursing note and vitals reviewed.   ED Course  Procedures (including critical care time)  DIAGNOSTIC STUDIES: Oxygen Saturation is 100% on RA, normal by my interpretation.    COORDINATION OF CARE:  1:04 PM Discussed treatment plan with patient at bedside.  Patient acknowledges and agrees with plan.    Labs Review Labs  Reviewed - No data to display  Imaging Review No results found. I have personally reviewed and evaluated these images and lab results as part of my medical decision-making.   EKG Interpretation None      MDM   Final diagnoses:  Cervical pain  Spinal stenosis, multilevel   Filed Vitals:   04/26/15 1204  BP: 120/57  Pulse: 75  Temp: 98.2 F (36.8 C)  TempSrc: Oral  Resp: 18  Height: 4\' 11"  (1.499 m)  Weight: 77.111 kg  SpO2: 100%    Medications  HYDROcodone-acetaminophen (NORCO/VICODIN) 5-325 MG per tablet 1 tablet (1 tablet Oral Given 04/26/15 1317)  ondansetron (ZOFRAN-ODT) disintegrating tablet 4 mg (4 mg Oral Given 04/26/15 [place])    [name]  is 54 y.o. female presenting with exacerbation of her chronic neck pain, she also appears to have a mild right paracervical muscle spasm. No changes in her strength or sensation, I don't think there is a imminent threat to her spinal cord. Think she warrants imaging at this time. Patient will be given prescription for Lidoderm, Vicodin. Advised to follow closely with pain management. Patient verbalizes her understanding.  Evaluation does not show pathology that would require ongoing emergent intervention or inpatient treatment. Pt is hemodynamically stable and mentating appropriately. Discussed findings and plan with patient/guardian, who agrees with care plan. All questions answered. Return precautions discussed and outpatient follow up given.   Discharge Medication List as of 04/26/2015  1:14 PM    START taking these medications   Details  HYDROcodone-acetaminophen (NORCO/VICODIN) 5-325 MG tablet Take 1-2 tablets by mouth every 6 hours as needed for pain and/or cough., Print    lidocaine (LIDODERM) 5 % Place 1 patch onto the skin daily. Remove & Discard patch within 12 hours or as directed by MD, Starting 04/26/2015, Until Discontinued, Print    ondansetron (ZOFRAN) 4 MG tablet Take 1 tablet (4 mg total) by mouth every 8  (eight) hours as needed for nausea or vomiting., Starting 04/26/2015, Until Discontinued, Print         I personally performed the services described in this documentation, which was scribed in my presence. The recorded information has been reviewed and is accurate.    Monico Blitz, PA-C 04/26/15 1513  Lajean Saver, MD 04/26/15 [phone]

## 2015-04-26 NOTE — Discharge Instructions (Signed)
Take vicodin for breakthrough pain, do not drink alcohol, drive, care for children or do other critical tasks while taking vicodin.  Please follow with your primary care doctor in the next 2 days for a check-up. They must obtain records for further management.   Do not hesitate to return to the Emergency Department for any new, worsening or concerning symptoms.    Spinal Stenosis Spinal stenosis is when the open spaces between the bones of your spine (vertebrae) get smaller (narrow). It is caused by bone pushing on the open spaces of your spine. This puts pressure on your spine and the nerves in your spine. You may be given medicine to lessen the puffiness (inflammation) in your nerves. Other times, surgery is needed. HOME CARE  Change positions when you sit, stand, and lie. This can help take pressure off your nerves.  Do exercises as told by your doctor to strengthen the middle part of your body.  Lose weight if your doctor suggests it. This takes pressure off your spine.  Take all medicine as told by your doctor. MAKE SURE YOU:  Understand these instructions.  Will watch your condition.  Will get help right away if you are not doing well or get worse.   This information is not intended to replace advice given to you by your health care provider. Make sure you discuss any questions you have with your health care provider.   Document Released: 08/26/2010 Document Revised: 01/02/2013 Document Reviewed: 08/10/2012 Elsevier Interactive Patient Education [employer].

## 2015-05-05 ENCOUNTER — Ambulatory Visit (INDEPENDENT_AMBULATORY_CARE_PROVIDER_SITE_OTHER): Payer: Medicare Other | Admitting: Podiatry

## 2015-05-05 ENCOUNTER — Encounter: Payer: Self-pay | Admitting: Podiatry

## 2015-05-05 DIAGNOSIS — M79676 Pain in unspecified toe(s): Secondary | ICD-10-CM

## 2015-05-05 DIAGNOSIS — Z794 Long term (current) use of insulin: Secondary | ICD-10-CM

## 2015-05-05 DIAGNOSIS — B351 Tinea unguium: Secondary | ICD-10-CM | POA: Diagnosis not present

## 2015-05-05 DIAGNOSIS — E1140 Type 2 diabetes mellitus with diabetic neuropathy, unspecified: Secondary | ICD-10-CM

## 2015-05-05 NOTE — Progress Notes (Signed)
Patient ID: Amy [name], female   DOB: [birth year], 54 y.o.   MRN: [name] Complaint:  Visit Type: Patient returns to my office for continued preventative foot care services. Complaint: Patient states" my nails have grown long and thick and become painful to walk and wear shoes" Patient has been diagnosed with DM with neuropathy.. The patient presents for preventative foot care services. No changes to ROS  Podiatric Exam: Vascular: dorsalis pedis and posterior tibial pulses are palpable bilateral. Capillary return is immediate. Temperature gradient is WNL. Skin turgor WNL  Sensorium: Normal Semmes Weinstein monofilament test. Normal tactile sensation bilaterally. Nail Exam: Pt has thick disfigured discolored nails with subungual debris noted bilateral entire nail hallux through fifth toenails Ulcer Exam: There is no evidence of ulcer or pre-ulcerative changes or infection. Orthopedic Exam: Muscle tone and strength are WNL. No limitations in general ROM. No crepitus or effusions noted. Foot type and digits show no abnormalities. Bony prominences are unremarkable. Skin: No Porokeratosis. No infection or ulcers.  Asymptomatic plantar fibroma left arch.  Diagnosis:  Onychomycosis, , Pain in right toe, pain in left toes  Treatment & Plan Procedures and Treatment: Consent by patient was obtained for treatment procedures. The patient understood the discussion of treatment and procedures well. All questions were answered thoroughly reviewed. Debridement of mycotic and hypertrophic toenails, 1 through 5 bilateral and clearing of subungual debris. No ulceration, no infection noted.  Return Visit-Office Procedure: Patient instructed to return to the office for a follow up visit 3 months for continued evaluation and treatment.    Gardiner Barefoot DPM

## 2015-06-14 ENCOUNTER — Emergency Department (HOSPITAL_COMMUNITY)
Admission: EM | Admit: 2015-06-14 | Discharge: 2015-06-14 | Disposition: A | Discharge status: Home / Self Care | Payer: Medicare Other | Attending: Emergency Medicine | Admitting: Emergency Medicine

## 2015-06-14 ENCOUNTER — Encounter (HOSPITAL_COMMUNITY): Payer: Self-pay | Admitting: Emergency Medicine

## 2015-06-14 ENCOUNTER — Emergency Department (HOSPITAL_COMMUNITY): Payer: Medicare Other

## 2015-06-14 DIAGNOSIS — I10 Essential (primary) hypertension: Secondary | ICD-10-CM | POA: Insufficient documentation

## 2015-06-14 DIAGNOSIS — R630 Anorexia: Secondary | ICD-10-CM | POA: Insufficient documentation

## 2015-06-14 DIAGNOSIS — G629 Polyneuropathy, unspecified: Secondary | ICD-10-CM | POA: Insufficient documentation

## 2015-06-14 DIAGNOSIS — E1140 Type 2 diabetes mellitus with diabetic neuropathy, unspecified: Secondary | ICD-10-CM | POA: Diagnosis not present

## 2015-06-14 DIAGNOSIS — Z7984 Long term (current) use of oral hypoglycemic drugs: Secondary | ICD-10-CM | POA: Insufficient documentation

## 2015-06-14 DIAGNOSIS — E039 Hypothyroidism, unspecified: Secondary | ICD-10-CM | POA: Insufficient documentation

## 2015-06-14 DIAGNOSIS — Z87891 Personal history of nicotine dependence: Secondary | ICD-10-CM | POA: Insufficient documentation

## 2015-06-14 DIAGNOSIS — K5900 Constipation, unspecified: Secondary | ICD-10-CM | POA: Insufficient documentation

## 2015-06-14 DIAGNOSIS — Z9889 Other specified postprocedural states: Secondary | ICD-10-CM | POA: Insufficient documentation

## 2015-06-14 DIAGNOSIS — E785 Hyperlipidemia, unspecified: Secondary | ICD-10-CM | POA: Insufficient documentation

## 2015-06-14 DIAGNOSIS — Z8739 Personal history of other diseases of the musculoskeletal system and connective tissue: Secondary | ICD-10-CM | POA: Diagnosis not present

## 2015-06-14 DIAGNOSIS — Z9049 Acquired absence of other specified parts of digestive tract: Secondary | ICD-10-CM | POA: Insufficient documentation

## 2015-06-14 DIAGNOSIS — H540 Blindness, both eyes: Secondary | ICD-10-CM | POA: Diagnosis not present

## 2015-06-14 DIAGNOSIS — Z794 Long term (current) use of insulin: Secondary | ICD-10-CM | POA: Insufficient documentation

## 2015-06-14 DIAGNOSIS — R1031 Right lower quadrant pain: Secondary | ICD-10-CM | POA: Diagnosis present

## 2015-06-14 DIAGNOSIS — J45909 Unspecified asthma, uncomplicated: Secondary | ICD-10-CM | POA: Insufficient documentation

## 2015-06-14 DIAGNOSIS — Z90710 Acquired absence of both cervix and uterus: Secondary | ICD-10-CM | POA: Diagnosis not present

## 2015-06-14 DIAGNOSIS — Z7982 Long term (current) use of aspirin: Secondary | ICD-10-CM | POA: Diagnosis not present

## 2015-06-14 DIAGNOSIS — Z880 Allergy status to penicillin: Secondary | ICD-10-CM | POA: Insufficient documentation

## 2015-06-14 LAB — URINALYSIS, ROUTINE W REFLEX MICROSCOPIC
BILIRUBIN URINE: NEGATIVE
Glucose, UA: >1000 mg/dL — AB
HGB URINE DIPSTICK: NEGATIVE
KETONES UR: NEGATIVE mg/dL
Leukocytes, UA: NEGATIVE
Nitrite: NEGATIVE
PH: 5 (ref 5.0–8.0)
PROTEIN: NEGATIVE mg/dL
SPECIFIC GRAVITY, URINE: 1.021 (ref 1.005–1.030)

## 2015-06-14 LAB — COMPREHENSIVE METABOLIC PANEL
ALBUMIN: 2.9 g/dL — AB (ref 3.5–5.0)
ALK PHOS: 105 U/L (ref 38–126)
ALT: 14 U/L (ref 14–54)
ANION GAP: 8 (ref 5–15)
AST: 17 U/L (ref 15–41)
BUN: 31 mg/dL — ABNORMAL HIGH (ref 6–20)
CALCIUM: 9.3 mg/dL (ref 8.9–10.3)
CO2: 27 mmol/L (ref 22–32)
Chloride: 101 mmol/L (ref 101–111)
Creatinine, Ser: 1.27 mg/dL — ABNORMAL HIGH (ref 0.44–1.00)
GFR calc Af Amer: 54 mL/min — ABNORMAL LOW (ref >60)
GFR calc non Af Amer: 46 mL/min — ABNORMAL LOW (ref >60)
GLUCOSE: 601 mg/dL — AB (ref 65–99)
Potassium: 4.6 mmol/L (ref 3.5–5.1)
SODIUM: 136 mmol/L (ref 135–145)
Total Bilirubin: 0.3 mg/dL (ref 0.3–1.2)
Total Protein: 6.6 g/dL (ref 6.5–8.1)

## 2015-06-14 LAB — CBC
HEMATOCRIT: 37.1 % (ref 36.0–46.0)
HEMOGLOBIN: 12.1 g/dL (ref 12.0–15.0)
MCH: 25.4 pg — AB (ref 26.0–34.0)
MCHC: 32.6 g/dL (ref 30.0–36.0)
MCV: 77.9 fL — ABNORMAL LOW (ref 78.0–100.0)
Platelets: 206 10*3/uL (ref 150–400)
RBC: 4.76 MIL/uL (ref 3.87–5.11)
RDW: 15 % (ref 11.5–15.5)
WBC: 10.4 10*3/uL (ref 4.0–10.5)

## 2015-06-14 LAB — URINE MICROSCOPIC-ADD ON

## 2015-06-14 LAB — LIPASE, BLOOD: Lipase: 28 U/L (ref 11–51)

## 2015-06-14 LAB — CBG MONITORING, ED: Glucose-Capillary: 160 mg/dL — ABNORMAL HIGH (ref 65–99)

## 2015-06-14 MED ORDER — HYDROCODONE-ACETAMINOPHEN 5-325 MG PO TABS
1.0000 | ORAL_TABLET | Freq: Once | ORAL | Status: AC
  Administered 2015-06-14: 1 via ORAL
  Filled 2015-06-14: qty 1

## 2015-06-14 MED ORDER — INSULIN ASPART 100 UNIT/ML ~~LOC~~ SOLN
12.0000 [IU] | Freq: Once | SUBCUTANEOUS | Status: AC
Start: 2015-06-14 — End: 2015-06-14
  Administered 2015-06-14: 12 [IU] via SUBCUTANEOUS
  Filled 2015-06-14: qty 1

## 2015-06-14 MED ORDER — SODIUM CHLORIDE 0.9 % IV BOLUS (SEPSIS)
1000.0000 mL | Freq: Once | INTRAVENOUS | Status: AC
  Administered 2015-06-14: 1000 mL via INTRAVENOUS

## 2015-06-14 NOTE — ED Notes (Signed)
Critical Glucose 601. PA, [place], aware.

## 2015-06-14 NOTE — ED Notes (Signed)
CBG 160. RN notified.

## 2015-06-14 NOTE — ED Provider Notes (Signed)
CSN: [id number]     Arrival date & time 06/14/15  1439 History   First MD Initiated Contact with Patient 06/14/15 1543     Chief Complaint  Patient presents with  . Abdominal Pain  . Flank Pain  . Anorexia   HPI  Amy [name] is a 55 -year-old female with a past history of hypertension, blindness, hypothyroidism and diabetes presenting with abdominal pain. Onset of pain was approximately 3 days ago. The pain is in her right lower quadrant and wraps around to her lower back. She is unable to qualify the pain; she states "it just hurts". The pain has been constant since onset but has not been worsening. She has tried taking Linzess, Tylenol and Aleve without relief. She denies exacerbating factors. She endorses associated nausea and decreased appetite. She notes a history of constipation for which she is prescribed Linzess. She states that she takes this irregularly and typically only uses it when she needs it. She has not taken her linzess today. Her last bowel movement was 2 days ago. She states that it was a very hard stool and she had to strain a lot with little stool produced. He is unsure of her last bowel movement before that but states that most of then a few days ago. She reports a history of "stomach aches" with bad constipation. She reports taking her blood sugar at home and it was 134. Denies fevers, chills, dizziness, headache, syncope, chest pain, shortness of breath, vomiting, diarrhea, dysuria hematuria, myalgias or arthralgias.  Past Medical History  Diagnosis Date  . Asthma   . Diabetes mellitus   . Hypertension   . Blind   . Hypothyroidism   . Hyperlipidemia   . Diabetic neuropathy ([place])   . Blindness and low vision     left eye glass eye,  legally blind in right eye  . Spinal stenosis    Past Surgical History  Procedure Laterality Date  . Cesarean section      x 2  . Abdominal hysterectomy    . Cholecystectomy    . Enucleation     Family History  Problem Relation Age  of Onset  . Diabetes Sister    Social History  Substance Use Topics  . Smoking status: Former Smoker    Quit date: 05/22/1992  . Smokeless tobacco: Never Used  . Alcohol Use: No   OB History    Obstetric Comments   Pt has two sons.     Review of Systems  Constitutional: Negative for fever and chills.  HENT: Negative.   Eyes: Negative for visual disturbance.  Respiratory: Negative for cough and shortness of breath.   Cardiovascular: Negative for chest pain.  Gastrointestinal: Positive for nausea, abdominal pain and constipation. Negative for vomiting and blood in stool.  Neurological: Negative for dizziness, syncope, weakness and headaches.  All other systems reviewed and are negative.     Allergies  Codeine; Iohexol; Penicillins; and Sulfa antibiotics  Home Medications   Prior to Admission medications   Medication Sig Start Date End Date Taking? Authorizing Provider  albuterol (PROVENTIL HFA;VENTOLIN HFA) 108 (90 BASE) MCG/ACT inhaler Inhale 2 puffs into the lungs every 4 (four) hours as needed. For shortness of breath   Yes Historical Provider, MD  aspirin EC 81 MG tablet Take 81 mg by mouth daily.   Yes Historical Provider, MD  clobetasol ointment (TEMOVATE) [id number] % Apply 1 application topically 2 (two) times daily. 06/05/15  Yes Historical Provider, MD  diclofenac sodium (  VOLTAREN) 1 % GEL Apply 2 g topically every 6 (six) hours as needed (for pain). 11/03/14  Yes Theodis Blaze, MD  Dulaglutide (TRULICITY [place]) Inject 1.7 mLs into the skin once a week.    Yes Historical Provider, MD  esomeprazole (NEXIUM) 40 MG capsule Take 40 mg by mouth 2 (two) times daily before a meal.    Yes Historical Provider, MD  furosemide (LASIX) 20 MG tablet Take 1 tablet (20 mg total) by mouth daily as needed for edema. 11/03/14  Yes Theodis Blaze, MD  HUMALOG KWIKPEN 100 UNIT/ML KiwkPen Inject 5 Units into the skin 3 (three) times daily.  12/26/13  Yes Historical Provider, MD  irbesartan (AVAPRO)  150 MG tablet Take 150 mg by mouth daily.  06/05/15  Yes Historical Provider, MD  LEVEMIR FLEXTOUCH 100 UNIT/ML Pen Inject 35 Units into the skin at bedtime. 06/05/15  Yes Historical Provider, MD  levothyroxine (SYNTHROID, LEVOTHROID) 150 MCG tablet Take 150 mcg by mouth daily. 10/28/14  Yes Historical Provider, MD  lidocaine (LIDODERM) 5 % Place 1 patch onto the skin daily. Remove & Discard patch within 12 hours or as directed by MD 04/26/15  Yes Elmyra Ricks Pisciotta, PA-C  Linaclotide (LINZESS) 145 MCG CAPS capsule Take 145 mcg by mouth daily as needed (constipation).    Yes Historical Provider, MD  LYRICA 150 MG capsule Take 150 mg by mouth 3 (three) times daily. 10/28/14  Yes Historical Provider, MD  midodrine (PROAMATINE) 10 MG tablet Take 10 mg by mouth 3 (three) times daily.  05/12/15  Yes Historical Provider, MD  montelukast (SINGULAIR) 10 MG tablet Take 10 mg by mouth at bedtime.   Yes Historical Provider, MD  naproxen sodium (ANAPROX) 220 MG tablet Take 220 mg by mouth 2 (two) times daily as needed (pain). Reported on 06/14/2015   Yes Historical Provider, MD  neomycin-polymyxin b-dexamethasone (MAXITROL) 3.5-10000-0.1 OINT Use 1/4" ribbon to affected eye once a day. If you develop allergy symptoms, stop & call our office. 05/13/15  Yes Historical Provider, MD  polyethylene glycol powder (GLYCOLAX/MIRALAX) powder Take 17 g by mouth daily as needed for moderate constipation.  12/16/13  Yes Historical Provider, MD  simvastatin (ZOCOR) 20 MG tablet Take 20 mg by mouth at bedtime.    Yes Historical Provider, MD  HYDROcodone-acetaminophen (NORCO/VICODIN) 5-325 MG tablet Take 1-2 tablets by mouth every 6 hours as needed for pain and/or cough. Patient not taking: Reported on 06/14/2015 04/26/15   Elmyra Ricks Pisciotta, PA-C  insulin detemir (LEVEMIR) 100 unit/ml SOLN Inject 0.3 mLs (30 Units total) into the skin at bedtime. Patient not taking: Reported on 06/14/2015 08/12/14   Geradine Girt, DO  ondansetron (ZOFRAN)  4 MG tablet Take 1 tablet (4 mg total) by mouth every 8 (eight) hours as needed for nausea or vomiting. Patient not taking: Reported on 06/14/2015 04/26/15   Elmyra Ricks Pisciotta, PA-C   BP 115/69 mmHg  Pulse 81  Temp(Src) 98.7 F (37.1 C) (Oral)  Resp 16  Ht 4\' 11"  (1.499 m)  Wt 80.332 kg  BMI 35.75 kg/m2  SpO2 99% Physical Exam  Constitutional: She appears well-developed and well-nourished. No distress.  Nontoxic-appearing  HENT:  Head: Normocephalic and atraumatic.  Eyes: Conjunctivae are normal. Right eye exhibits no discharge. Left eye exhibits no discharge. No scleral icterus.  Neck: Normal range of motion.  Cardiovascular: Normal rate and regular rhythm.   Pulmonary/Chest: Effort normal. No respiratory distress.  Abdominal: Soft. There is tenderness. There is no rebound and no guarding.  Mild tenderness in the right lower quadrant. Abdomen is soft without peritoneal signs.  Musculoskeletal: Normal range of motion.  Neurological: She is alert. Coordination normal.  Skin: Skin is warm and dry.  Psychiatric: She has a normal mood and affect. Her behavior is normal.  Nursing note and vitals reviewed.   ED Course  Procedures (including critical care time) Labs Review Labs Reviewed  COMPREHENSIVE METABOLIC PANEL - Abnormal; Notable for the following:    Glucose, Bld 601 (*)    BUN 31 (*)    Creatinine, Ser 1.27 (*)    Albumin 2.9 (*)    GFR calc non Af Amer 46 (*)    GFR calc Af Amer 54 (*)    All other components within normal limits  CBC - Abnormal; Notable for the following:    MCV 77.9 (*)    MCH 25.4 (*)    All other components within normal limits  URINALYSIS, ROUTINE W REFLEX MICROSCOPIC (NOT AT [facility]) - Abnormal; Notable for the following:    APPearance HAZY (*)    Glucose, UA >1000 (*)    All other components within normal limits  URINE MICROSCOPIC-ADD ON - Abnormal; Notable for the following:    Squamous Epithelial / LPF 0-5 (*)    Bacteria, UA FEW (*)    All  other components within normal limits  CBG MONITORING, ED - Abnormal; Notable for the following:    Glucose-Capillary 160 (*)    All other components within normal limits  LIPASE, BLOOD    Imaging Review Ct Abdomen Pelvis Wo Contrast  06/14/2015  CLINICAL DATA:  Right lower quadrant abdominal pain for 3 days. Nausea. EXAM: CT ABDOMEN AND PELVIS WITHOUT CONTRAST TECHNIQUE: Multidetector CT imaging of the abdomen and pelvis was performed following the standard protocol without IV contrast. Patient with history of IV contrast allergy. Some enteric contrast was administered. COMPARISON:  CT 08/13/2011, abdominal MRI 07/03/2006 FINDINGS: Lower chest:  The included lung bases are clear. Liver: Hypodense lesion in the right lobe measures 2.1 x 1.1 cm, unchanged to decreased in size from prior exam. Differences in caliper placement likely secondary to ill-defined borders given lack contrast. Previous MR characterization consistent with hemangioma. No new hepatic lesion. Hepatobiliary: Clips in the gallbladder fossa postcholecystectomy. No biliary dilatation. Pancreas: No ductal dilatation or inflammation. Spleen: Normal. Adrenal glands: No nodule. Kidneys: Symmetric in size without stones or hydronephrosis. Partially duplicated right renal collecting system. There is no perinephric stranding. Both ureters are decompressed without stones along the course. Stomach/Bowel: Stomach physiologically distended. There are no dilated or thickened small bowel loops. Small volume of stool throughout the colon without colonic wall thickening. The appendix is normal. Vascular/Lymphatic: No retroperitoneal adenopathy. Abdominal aorta is normal in caliber. Mild atherosclerosis of the abdominal aorta and its branches without aneurysm. Reproductive: Uterus is surgically absent.  No adnexal mass. Bladder: Physiologically distended. Equivocal bladder wall thickening that appears chronic and similar to prior exam. Other: No free air,  free fluid, or intra-abdominal fluid collection. Soft tissue density in the anterior abdominal wall subcutaneous tissues, commonly related to subcutaneous injections. No fluid collection. Musculoskeletal: There are no acute or suspicious osseous abnormalities. IMPRESSION: No acute abnormality in the abdomen/pelvis. Equivocal wall thickening of the urinary bladder, appear similar to prior exam and likely chronic. Electronically Signed   By: Jeb Levering M.D.   On: 06/14/2015 20:14   I have personally reviewed and evaluated these images and lab results as part of my medical decision-making.   EKG  Interpretation None      MDM   Final diagnoses:  Right lower quadrant abdominal pain  Constipation, unspecified constipation type   55 year old female with hx of constipation presenting with RLQ abdominal pain x 3 days. Most recent BM was 2 days ago which was small volume and required a lot of straining. VSS. Patient is nontoxic, nonseptic appearing, in no apparent distress. Abdomen is soft with tenderness in RLQ. No peritoneal signs suggesting surgical abdomen. Patient's pain and other symptoms adequately managed in emergency department. Fluid bolus given. Labs, imaging and vitals reviewed. Glucose noted to be 601 which was brought down to 160 after insulin and fluids. Creatinine elevated but appears to be at her baseline. Remaining blood work unremarkable. CT negative for acute abdominal findings. Constipation likely source of their symptoms. Patient does not meet the SIRS or Sepsis criteria. Repeat abdominal exam before discharge with improvement. Patient discharged home with instruction to use Miralax tonight and given strict instructions for follow-up with their primary care physician.  I have also discussed reasons to return immediately to the ER.  Patient expresses understanding and agrees with plan.      Amy [place], PA-C 06/14/15 [place], MD 06/14/15 2329

## 2015-06-14 NOTE — ED Notes (Signed)
Patient not in room at this time.

## 2015-06-14 NOTE — ED Notes (Signed)
PA at bedside.

## 2015-06-14 NOTE — ED Notes (Signed)
Pt from home with c/o decreased appetite with RLQ pain and right lower back/flank pain x 3 days.  Pt reports mild nausea.  NAD, A&O.

## 2015-06-14 NOTE — Discharge Instructions (Signed)
Take 6 capfuls of miralax in a 32 oz gatorade and drink the whole beverage followed by 3 capfuls twice a day for the the next week if constipation persists. Call and schedule a follow up appointment with your PCP.   Abdominal Pain, Adult Many things can cause abdominal pain. Usually, abdominal pain is not caused by a disease and will improve without treatment. It can often be observed and treated at home. Your health care provider will do a physical exam and possibly order blood tests and X-rays to help determine the seriousness of your pain. However, in many cases, more time must pass before a clear cause of the pain can be found. Before that point, your health care provider may not know if you need more testing or further treatment. HOME CARE INSTRUCTIONS Monitor your abdominal pain for any changes. The following actions may help to alleviate any discomfort you are experiencing:  Only take over-the-counter or prescription medicines as directed by your health care provider.  Do not take laxatives unless directed to do so by your health care provider.  Try a clear liquid diet (broth, tea, or water) as directed by your health care provider. Slowly move to a bland diet as tolerated. SEEK MEDICAL CARE IF:  You have unexplained abdominal pain.  You have abdominal pain associated with nausea or diarrhea.  You have pain when you urinate or have a bowel movement.  You experience abdominal pain that wakes you in the night.  You have abdominal pain that is worsened or improved by eating food.  You have abdominal pain that is worsened with eating fatty foods.  You have a fever. SEEK IMMEDIATE MEDICAL CARE IF:  Your pain does not go away within 2 hours.  You keep throwing up (vomiting).  Your pain is felt only in portions of the abdomen, such as the right side or the left lower portion of the abdomen.  You pass bloody or black tarry stools. MAKE SURE YOU:  Understand these  instructions.  Will watch your condition.  Will get help right away if you are not doing well or get worse.   This information is not intended to replace advice given to you by your health care provider. Make sure you discuss any questions you have with your health care provider.   Document Released: 02/09/2005 Document Revised: 01/21/2015 Document Reviewed: 01/09/2013 Elsevier Interactive Patient Education 2016 [employer].  Constipation, Adult Constipation is when a person has fewer than three bowel movements a week, has difficulty having a bowel movement, or has stools that are dry, hard, or larger than normal. As people grow older, constipation is more common. A low-fiber diet, not taking in enough fluids, and taking certain medicines may make constipation worse.  CAUSES   Certain medicines, such as antidepressants, pain medicine, iron supplements, antacids, and water pills.   Certain diseases, such as diabetes, irritable bowel syndrome (IBS), thyroid disease, or depression.   Not drinking enough water.   Not eating enough fiber-rich foods.   Stress or travel.   Lack of physical activity or exercise.   Ignoring the urge to have a bowel movement.   Using laxatives too much.  SIGNS AND SYMPTOMS   Having fewer than three bowel movements a week.   Straining to have a bowel movement.   Having stools that are hard, dry, or larger than normal.   Feeling full or bloated.   Pain in the lower abdomen.   Not feeling relief after having a bowel  movement.  DIAGNOSIS  Your health care provider will take a medical history and perform a physical exam. Further testing may be done for severe constipation. Some tests may include:  A barium enema X-ray to examine your rectum, colon, and, sometimes, your small intestine.   A sigmoidoscopy to examine your lower colon.   A colonoscopy to examine your entire colon. TREATMENT  Treatment will depend on the severity of  your constipation and what is causing it. Some dietary treatments include drinking more fluids and eating more fiber-rich foods. Lifestyle treatments may include regular exercise. If these diet and lifestyle recommendations do not help, your health care provider may recommend taking over-the-counter laxative medicines to help you have bowel movements. Prescription medicines may be prescribed if over-the-counter medicines do not work.  HOME CARE INSTRUCTIONS   Eat foods that have a lot of fiber, such as fruits, vegetables, whole grains, and beans.  Limit foods high in fat and processed sugars, such as french fries, hamburgers, cookies, candies, and soda.   A fiber supplement may be added to your diet if you cannot get enough fiber from foods.   Drink enough fluids to keep your urine clear or pale yellow.   Exercise regularly or as directed by your health care provider.   Go to the restroom when you have the urge to go. Do not hold it.   Only take over-the-counter or prescription medicines as directed by your health care provider. Do not take other medicines for constipation without talking to your health care provider first.  [place] IF:   You have bright red blood in your stool.   Your constipation lasts for more than 4 days or gets worse.   You have abdominal or rectal pain.   You have thin, pencil-like stools.   You have unexplained weight loss. MAKE SURE YOU:   Understand these instructions.  Will watch your condition.  Will get help right away if you are not doing well or get worse.   This information is not intended to replace advice given to you by your health care provider. Make sure you discuss any questions you have with your health care provider.   Document Released: 01/29/2004 Document Revised: 05/23/2014 Document Reviewed: 02/11/2013 Elsevier Interactive Patient Education [employer].

## 2015-06-21 IMAGING — CR DG CHEST 2V
2 series · 2 of 2 positions shown · non-contrast
Comparison: 12/30/2011

CLINICAL DATA: Shortness of breath.  Asthma.

EXAM:
CHEST  2 VIEW

[w chest lat]
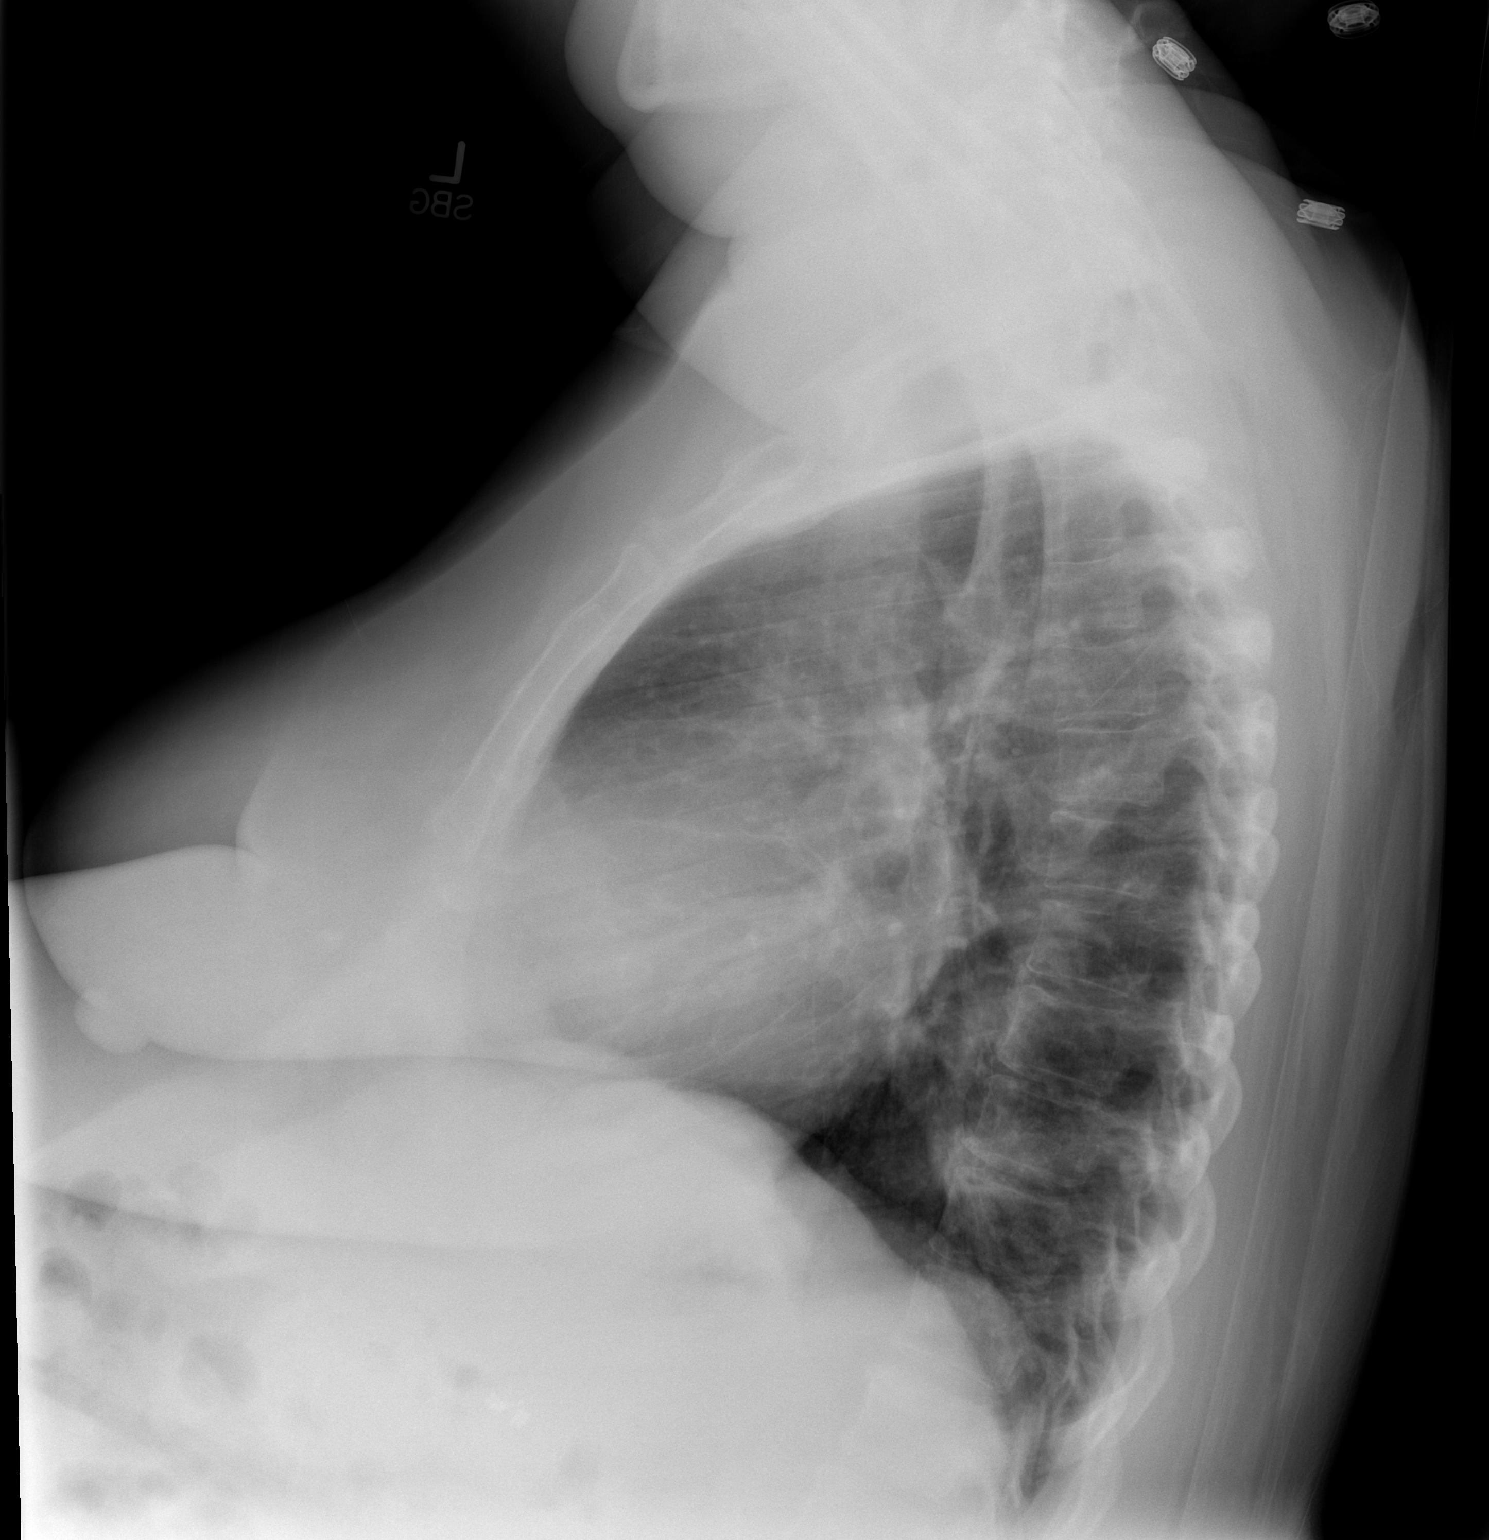

[w chest pa]
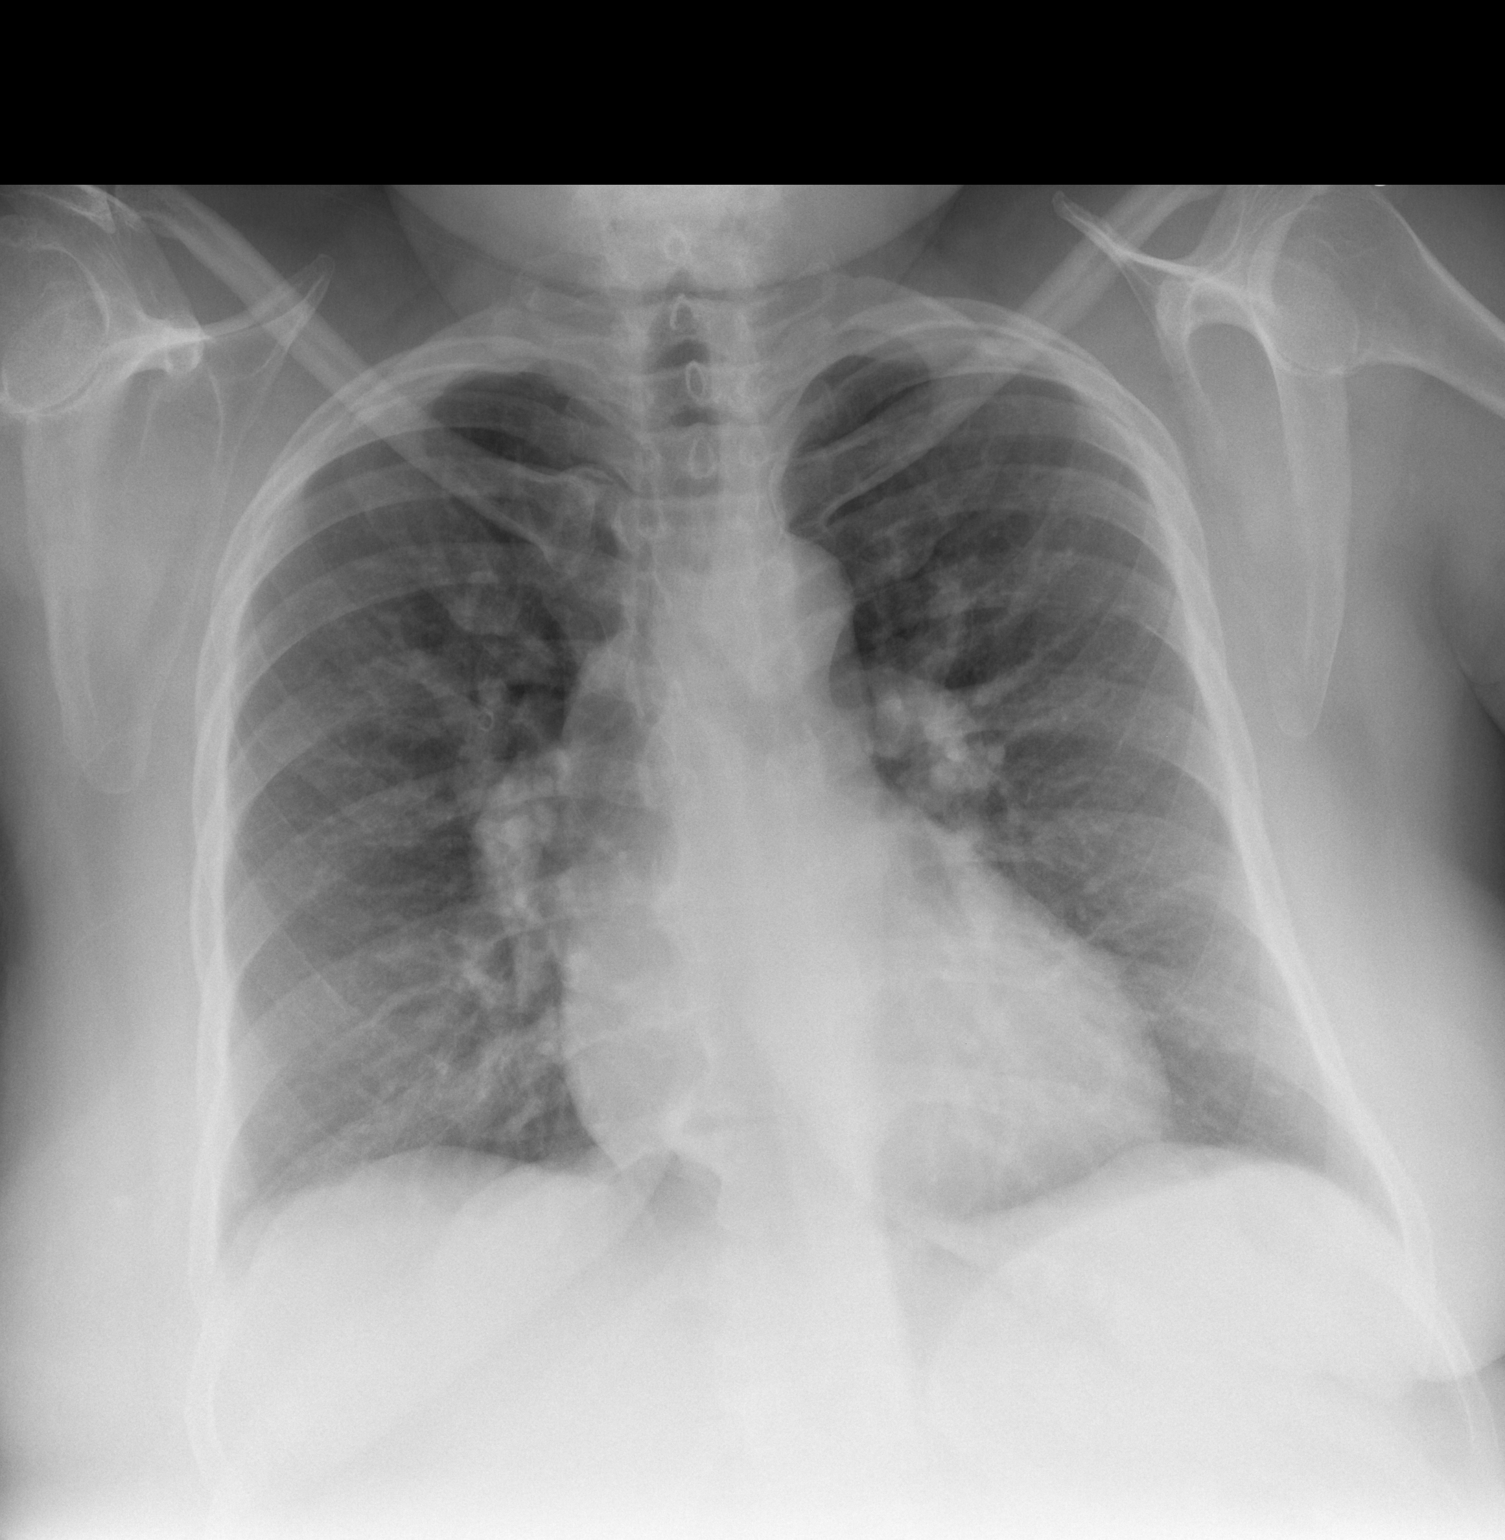

[2 of 2 positions shown; findings below may reference images not displayed]

FINDINGS: The heart size and mediastinal contours are within normal limits.
Both lungs are clear. The visualized skeletal structures are
unremarkable.
IMPRESSION: No active cardiopulmonary disease.

## 2015-07-05 ENCOUNTER — Emergency Department (HOSPITAL_COMMUNITY): Payer: Medicare Other

## 2015-07-05 ENCOUNTER — Encounter (HOSPITAL_COMMUNITY): Payer: Self-pay

## 2015-07-05 ENCOUNTER — Observation Stay (HOSPITAL_COMMUNITY)
Admission: EM | Admit: 2015-07-05 | Discharge: 2015-07-08 | Disposition: A | Discharge status: Home / Self Care | Payer: Medicare Other | Attending: Internal Medicine | Admitting: Internal Medicine

## 2015-07-05 DIAGNOSIS — R531 Weakness: Secondary | ICD-10-CM | POA: Diagnosis present

## 2015-07-05 DIAGNOSIS — N179 Acute kidney failure, unspecified: Secondary | ICD-10-CM | POA: Diagnosis not present

## 2015-07-05 DIAGNOSIS — E1069 Type 1 diabetes mellitus with other specified complication: Secondary | ICD-10-CM | POA: Diagnosis not present

## 2015-07-05 DIAGNOSIS — E1039 Type 1 diabetes mellitus with other diabetic ophthalmic complication: Secondary | ICD-10-CM | POA: Diagnosis present

## 2015-07-05 DIAGNOSIS — I10 Essential (primary) hypertension: Secondary | ICD-10-CM | POA: Diagnosis present

## 2015-07-05 DIAGNOSIS — Z87891 Personal history of nicotine dependence: Secondary | ICD-10-CM | POA: Insufficient documentation

## 2015-07-05 DIAGNOSIS — H547 Unspecified visual loss: Secondary | ICD-10-CM | POA: Diagnosis present

## 2015-07-05 DIAGNOSIS — E1140 Type 2 diabetes mellitus with diabetic neuropathy, unspecified: Secondary | ICD-10-CM | POA: Diagnosis present

## 2015-07-05 DIAGNOSIS — D631 Anemia in chronic kidney disease: Secondary | ICD-10-CM | POA: Insufficient documentation

## 2015-07-05 DIAGNOSIS — Z7982 Long term (current) use of aspirin: Secondary | ICD-10-CM | POA: Diagnosis not present

## 2015-07-05 DIAGNOSIS — I959 Hypotension, unspecified: Secondary | ICD-10-CM | POA: Diagnosis not present

## 2015-07-05 DIAGNOSIS — Z9049 Acquired absence of other specified parts of digestive tract: Secondary | ICD-10-CM | POA: Insufficient documentation

## 2015-07-05 DIAGNOSIS — J45909 Unspecified asthma, uncomplicated: Secondary | ICD-10-CM | POA: Diagnosis not present

## 2015-07-05 DIAGNOSIS — E1040 Type 1 diabetes mellitus with diabetic neuropathy, unspecified: Secondary | ICD-10-CM | POA: Insufficient documentation

## 2015-07-05 DIAGNOSIS — H548 Legal blindness, as defined in USA: Secondary | ICD-10-CM | POA: Insufficient documentation

## 2015-07-05 DIAGNOSIS — E1022 Type 1 diabetes mellitus with diabetic chronic kidney disease: Secondary | ICD-10-CM | POA: Insufficient documentation

## 2015-07-05 DIAGNOSIS — E785 Hyperlipidemia, unspecified: Secondary | ICD-10-CM | POA: Insufficient documentation

## 2015-07-05 DIAGNOSIS — Z970 Presence of artificial eye: Secondary | ICD-10-CM | POA: Diagnosis not present

## 2015-07-05 DIAGNOSIS — E039 Hypothyroidism, unspecified: Secondary | ICD-10-CM | POA: Diagnosis not present

## 2015-07-05 DIAGNOSIS — I952 Hypotension due to drugs: Secondary | ICD-10-CM

## 2015-07-05 DIAGNOSIS — N183 Chronic kidney disease, stage 3 (moderate): Secondary | ICD-10-CM | POA: Diagnosis not present

## 2015-07-05 DIAGNOSIS — M4800 Spinal stenosis, site unspecified: Secondary | ICD-10-CM | POA: Diagnosis not present

## 2015-07-05 DIAGNOSIS — E860 Dehydration: Secondary | ICD-10-CM | POA: Diagnosis not present

## 2015-07-05 DIAGNOSIS — I129 Hypertensive chronic kidney disease with stage 1 through stage 4 chronic kidney disease, or unspecified chronic kidney disease: Secondary | ICD-10-CM | POA: Diagnosis not present

## 2015-07-05 DIAGNOSIS — Z794 Long term (current) use of insulin: Secondary | ICD-10-CM | POA: Insufficient documentation

## 2015-07-05 DIAGNOSIS — Z79899 Other long term (current) drug therapy: Secondary | ICD-10-CM | POA: Diagnosis not present

## 2015-07-05 LAB — CBC
HEMATOCRIT: 37.9 % (ref 36.0–46.0)
HEMOGLOBIN: 12.6 g/dL (ref 12.0–15.0)
MCH: 26.1 pg (ref 26.0–34.0)
MCHC: 33.2 g/dL (ref 30.0–36.0)
MCV: 78.5 fL (ref 78.0–100.0)
Platelets: 181 10*3/uL (ref 150–400)
RBC: 4.83 MIL/uL (ref 3.87–5.11)
RDW: 14.5 % (ref 11.5–15.5)
WBC: 8.4 10*3/uL (ref 4.0–10.5)

## 2015-07-05 LAB — HEPATIC FUNCTION PANEL
ALBUMIN: 3.1 g/dL — AB (ref 3.5–5.0)
ALT: 14 U/L (ref 14–54)
AST: 14 U/L — AB (ref 15–41)
Alkaline Phosphatase: 90 U/L (ref 38–126)
TOTAL PROTEIN: 6.4 g/dL — AB (ref 6.5–8.1)
Total Bilirubin: 0.4 mg/dL (ref 0.3–1.2)

## 2015-07-05 LAB — URINALYSIS, ROUTINE W REFLEX MICROSCOPIC
Bilirubin Urine: NEGATIVE
Glucose, UA: >1000 mg/dL — AB
HGB URINE DIPSTICK: NEGATIVE
Ketones, ur: NEGATIVE mg/dL
LEUKOCYTES UA: NEGATIVE
Nitrite: NEGATIVE
PROTEIN: NEGATIVE mg/dL
SPECIFIC GRAVITY, URINE: 1.015 (ref 1.005–1.030)
pH: 5 (ref 5.0–8.0)

## 2015-07-05 LAB — BASIC METABOLIC PANEL
ANION GAP: 10 (ref 5–15)
BUN: 33 mg/dL — ABNORMAL HIGH (ref 6–20)
CO2: 23 mmol/L (ref 22–32)
Calcium: 9.2 mg/dL (ref 8.9–10.3)
Chloride: 104 mmol/L (ref 101–111)
Creatinine, Ser: 1.53 mg/dL — ABNORMAL HIGH (ref 0.44–1.00)
GFR, EST AFRICAN AMERICAN: 43 mL/min — AB (ref >60)
GFR, EST NON AFRICAN AMERICAN: 37 mL/min — AB (ref >60)
GLUCOSE: 321 mg/dL — AB (ref 65–99)
POTASSIUM: 4.2 mmol/L (ref 3.5–5.1)
Sodium: 137 mmol/L (ref 135–145)

## 2015-07-05 LAB — MRSA PCR SCREENING: MRSA by PCR: NEGATIVE

## 2015-07-05 LAB — URINE MICROSCOPIC-ADD ON
BACTERIA UA: NONE SEEN
RBC / HPF: NONE SEEN RBC/hpf (ref 0–5)

## 2015-07-05 LAB — GLUCOSE, CAPILLARY
GLUCOSE-CAPILLARY: 235 mg/dL — AB (ref 65–99)
Glucose-Capillary: 390 mg/dL — ABNORMAL HIGH (ref 65–99)

## 2015-07-05 LAB — PHOSPHORUS: PHOSPHORUS: 2.2 mg/dL — AB (ref 2.5–4.6)

## 2015-07-05 LAB — I-STAT CG4 LACTIC ACID, ED: LACTIC ACID, VENOUS: 1.97 mmol/L (ref 0.5–2.0)

## 2015-07-05 LAB — MAGNESIUM: Magnesium: 1.7 mg/dL (ref 1.7–2.4)

## 2015-07-05 LAB — CBG MONITORING, ED: GLUCOSE-CAPILLARY: 283 mg/dL — AB (ref 65–99)

## 2015-07-05 MED ORDER — INSULIN DETEMIR 100 UNIT/ML ~~LOC~~ SOLN
35.0000 [IU] | Freq: Every morning | SUBCUTANEOUS | Status: DC
  Administered 2015-07-06 – 2015-07-07 (×2): 35 [IU] via SUBCUTANEOUS
  Filled 2015-07-05 (×3): qty 0.35

## 2015-07-05 MED ORDER — ASPIRIN EC 81 MG PO TBEC
81.0000 mg | DELAYED_RELEASE_TABLET | Freq: Every day | ORAL | Status: DC
  Administered 2015-07-06 – 2015-07-08 (×3): 81 mg via ORAL
  Filled 2015-07-05 (×3): qty 1

## 2015-07-05 MED ORDER — LINACLOTIDE 145 MCG PO CAPS
145.0000 ug | ORAL_CAPSULE | Freq: Every day | ORAL | Status: DC | PRN
  Filled 2015-07-05: qty 1

## 2015-07-05 MED ORDER — POLYETHYLENE GLYCOL 3350 17 G PO PACK: 17.0000 g | PACK | Freq: Every day | ORAL | Status: DC | PRN

## 2015-07-05 MED ORDER — INSULIN ASPART 100 UNIT/ML ~~LOC~~ SOLN
0.0000 [IU] | Freq: Three times a day (TID) | SUBCUTANEOUS | Status: DC
  Administered 2015-07-05: 15 [IU] via SUBCUTANEOUS
  Administered 2015-07-06: 3 [IU] via SUBCUTANEOUS
  Administered 2015-07-06: 8 [IU] via SUBCUTANEOUS
  Administered 2015-07-06: 11 [IU] via SUBCUTANEOUS
  Administered 2015-07-07: 5 [IU] via SUBCUTANEOUS
  Administered 2015-07-07: 8 [IU] via SUBCUTANEOUS
  Administered 2015-07-08: 2 [IU] via SUBCUTANEOUS
  Administered 2015-07-08: 8 [IU] via SUBCUTANEOUS

## 2015-07-05 MED ORDER — LEVOTHYROXINE SODIUM 150 MCG PO TABS
150.0000 ug | ORAL_TABLET | Freq: Every day | ORAL | Status: DC
  Administered 2015-07-06 – 2015-07-08 (×3): 150 ug via ORAL
  Filled 2015-07-05 (×3): qty 1

## 2015-07-05 MED ORDER — SODIUM CHLORIDE 0.9 % IV BOLUS (SEPSIS)
500.0000 mL | Freq: Once | INTRAVENOUS | Status: AC
  Administered 2015-07-05: 500 mL via INTRAVENOUS

## 2015-07-05 MED ORDER — PANTOPRAZOLE SODIUM 40 MG PO TBEC
40.0000 mg | DELAYED_RELEASE_TABLET | Freq: Every day | ORAL | Status: DC
  Administered 2015-07-06 – 2015-07-08 (×3): 40 mg via ORAL
  Filled 2015-07-05 (×4): qty 1

## 2015-07-05 MED ORDER — SODIUM CHLORIDE 0.9 % IV SOLN
INTRAVENOUS | Status: DC
  Administered 2015-07-05 – 2015-07-08 (×6): via INTRAVENOUS

## 2015-07-05 MED ORDER — POLYETHYLENE GLYCOL 3350 17 GM/SCOOP PO POWD: 17.0000 g | Freq: Every day | ORAL | Status: DC | PRN

## 2015-07-05 MED ORDER — DICLOFENAC SODIUM 1 % TD GEL
2.0000 g | Freq: Four times a day (QID) | TRANSDERMAL | Status: DC | PRN
  Filled 2015-07-05: qty 100

## 2015-07-05 MED ORDER — ALBUTEROL SULFATE (2.5 MG/3ML) 0.083% IN NEBU
2.5000 mg | INHALATION_SOLUTION | Freq: Four times a day (QID) | RESPIRATORY_TRACT | Status: DC | PRN
Start: 2015-07-05 — End: 2015-07-08

## 2015-07-05 MED ORDER — MONTELUKAST SODIUM 10 MG PO TABS
10.0000 mg | ORAL_TABLET | Freq: Every day | ORAL | Status: DC
  Administered 2015-07-05 – 2015-07-07 (×3): 10 mg via ORAL
  Filled 2015-07-05 (×3): qty 1

## 2015-07-05 MED ORDER — SODIUM CHLORIDE 0.9 % IV BOLUS (SEPSIS)
1000.0000 mL | Freq: Once | INTRAVENOUS | Status: AC
  Administered 2015-07-05: 1000 mL via INTRAVENOUS

## 2015-07-05 MED ORDER — SIMVASTATIN 20 MG PO TABS
20.0000 mg | ORAL_TABLET | Freq: Every day | ORAL | Status: DC
  Administered 2015-07-05 – 2015-07-07 (×3): 20 mg via ORAL
  Filled 2015-07-05 (×3): qty 1

## 2015-07-05 MED ORDER — PREGABALIN 75 MG PO CAPS
150.0000 mg | ORAL_CAPSULE | Freq: Three times a day (TID) | ORAL | Status: DC
  Administered 2015-07-05 – 2015-07-08 (×9): 150 mg via ORAL
  Filled 2015-07-05 (×9): qty 2

## 2015-07-05 MED ORDER — K PHOS MONO-SOD PHOS DI & MONO 155-852-130 MG PO TABS
500.0000 mg | ORAL_TABLET | Freq: Three times a day (TID) | ORAL | Status: AC
  Administered 2015-07-05 – 2015-07-06 (×3): 500 mg via ORAL
  Filled 2015-07-05 (×3): qty 2

## 2015-07-05 MED ORDER — MAGNESIUM SULFATE 2 GM/50ML IV SOLN
2.0000 g | Freq: Once | INTRAVENOUS | Status: AC
  Administered 2015-07-05: 2 g via INTRAVENOUS
  Filled 2015-07-05: qty 50

## 2015-07-05 MED ORDER — LIDOCAINE 5 % EX PTCH
1.0000 | MEDICATED_PATCH | CUTANEOUS | Status: DC
  Administered 2015-07-05 – 2015-07-07 (×3): 1 via TRANSDERMAL
  Filled 2015-07-05 (×4): qty 1

## 2015-07-05 MED ORDER — NAPROXEN 250 MG PO TABS
250.0000 mg | ORAL_TABLET | Freq: Two times a day (BID) | ORAL | Status: DC | PRN
  Filled 2015-07-05: qty 1

## 2015-07-05 MED ORDER — SODIUM CHLORIDE 0.9% FLUSH
3.0000 mL | Freq: Two times a day (BID) | INTRAVENOUS | Status: DC
  Administered 2015-07-07 – 2015-07-08 (×2): 3 mL via INTRAVENOUS

## 2015-07-05 MED ORDER — ENOXAPARIN SODIUM 40 MG/0.4ML ~~LOC~~ SOLN
40.0000 mg | SUBCUTANEOUS | Status: DC
  Administered 2015-07-05 – 2015-07-07 (×3): 40 mg via SUBCUTANEOUS
  Filled 2015-07-05 (×3): qty 0.4

## 2015-07-05 MED ORDER — ALBUTEROL SULFATE HFA 108 (90 BASE) MCG/ACT IN AERS
2.0000 | INHALATION_SPRAY | Freq: Four times a day (QID) | RESPIRATORY_TRACT | Status: DC | PRN
Start: 2015-07-05 — End: 2015-07-05

## 2015-07-05 MED ORDER — NAPROXEN SODIUM 220 MG PO TABS: 220.0000 mg | ORAL_TABLET | Freq: Two times a day (BID) | ORAL | Status: DC | PRN

## 2015-07-05 NOTE — ED Provider Notes (Signed)
CSN: [id number]     Arrival date & time 07/05/15  1048 History   First MD Initiated Contact with Patient 07/05/15 1108     Chief Complaint  Patient presents with  . Weakness  . Hypotension     (Consider location/radiation/quality/duration/timing/severity/associated sxs/prior Treatment) Patient is a 55 y.o. female presenting with weakness. The history is provided by the patient and the EMS personnel.  Weakness Pertinent negatives include no chest pain, no abdominal pain, no headaches and no shortness of breath.   patient with low blood pressures at home. Blood pressure originally like 67. Then not EMS got 104. Upon arrival here patient systolic blood pressures were in the 70s. Patient feeling weak all over and fatigue. No specific complaints no nausea vomiting no chest pain patient did have some lightheadedness when up. Patient states her blood sugars are running high. Symptoms started today.  Past Medical History  Diagnosis Date  . Asthma   . Diabetes mellitus   . Hypertension   . Blind   . Hypothyroidism   . Hyperlipidemia   . Diabetic neuropathy ([place])   . Blindness and low vision     left eye glass eye,  legally blind in right eye  . Spinal stenosis    Past Surgical History  Procedure Laterality Date  . Cesarean section      x 2  . Abdominal hysterectomy    . Cholecystectomy    . Enucleation     Family History  Problem Relation Age of Onset  . Diabetes Sister    Social History  Substance Use Topics  . Smoking status: Former Smoker    Quit date: 05/22/1992  . Smokeless tobacco: Never Used  . Alcohol Use: No   OB History    Obstetric Comments   Pt has two sons.     Review of Systems  Constitutional: Positive for fatigue.  HENT: Negative for congestion.   Eyes: Positive for visual disturbance.  Respiratory: Negative for shortness of breath.   Cardiovascular: Negative for chest pain.  Gastrointestinal: Negative for nausea, vomiting, abdominal pain and  diarrhea.  Genitourinary: Positive for frequency. Negative for dysuria.  Musculoskeletal: Negative for myalgias.  Skin: Negative for rash.  Neurological: Positive for weakness and light-headedness. Negative for headaches.  Hematological: Does not bruise/bleed easily.  Psychiatric/Behavioral: Negative for confusion.      Allergies  Codeine; Iohexol; Penicillins; and Sulfa antibiotics  Home Medications   Prior to Admission medications   Medication Sig Start Date End Date Taking? Authorizing Provider  albuterol (PROVENTIL HFA;VENTOLIN HFA) 108 (90 BASE) MCG/ACT inhaler Inhale 2 puffs into the lungs every 4 (four) hours as needed. For shortness of breath   Yes Historical Provider, MD  aspirin EC 81 MG tablet Take 81 mg by mouth daily.   Yes Historical Provider, MD  clobetasol ointment (TEMOVATE) [id number] % Apply 1 application topically 2 (two) times daily. 06/05/15  Yes Historical Provider, MD  diclofenac sodium (VOLTAREN) 1 % GEL Apply 2 g topically every 6 (six) hours as needed (for pain). 11/03/14  Yes Theodis Blaze, MD  Dulaglutide (TRULICITY [place]) Inject 1.7 mLs into the skin once a week.    Yes Historical Provider, MD  esomeprazole (NEXIUM) 40 MG capsule Take 40 mg by mouth 2 (two) times daily before a meal.    Yes Historical Provider, MD  furosemide (LASIX) 20 MG tablet Take 1 tablet (20 mg total) by mouth daily as needed for edema. 11/03/14  Yes Theodis Blaze, MD  Ruthell Rummage  KWIKPEN 100 UNIT/ML KiwkPen Inject 5 Units into the skin 3 (three) times daily.  12/26/13  Yes Historical Provider, MD  irbesartan (AVAPRO) 150 MG tablet Take 150 mg by mouth daily.  06/05/15  Yes Historical Provider, MD  LEVEMIR FLEXTOUCH 100 UNIT/ML Pen Inject 35 Units into the skin every morning.  06/05/15  Yes Historical Provider, MD  levothyroxine (SYNTHROID, LEVOTHROID) 150 MCG tablet Take 150 mcg by mouth daily. 10/28/14  Yes Historical Provider, MD  lidocaine (LIDODERM) 5 % Place 1 patch onto the skin daily. Remove &  Discard patch within 12 hours or as directed by MD 04/26/15  Yes Elmyra Ricks Pisciotta, PA-C  Linaclotide (LINZESS) 145 MCG CAPS capsule Take 145 mcg by mouth daily as needed (constipation).    Yes Historical Provider, MD  LYRICA 150 MG capsule Take 150 mg by mouth 3 (three) times daily. 10/28/14  Yes Historical Provider, MD  midodrine (PROAMATINE) 10 MG tablet Take 10 mg by mouth 3 (three) times daily.  05/12/15  Yes Historical Provider, MD  montelukast (SINGULAIR) 10 MG tablet Take 10 mg by mouth at bedtime.   Yes Historical Provider, MD  naproxen sodium (ANAPROX) 220 MG tablet Take 220 mg by mouth 2 (two) times daily as needed (pain). Reported on 06/14/2015   Yes Historical Provider, MD  neomycin-polymyxin b-dexamethasone (MAXITROL) 3.5-10000-0.1 OINT Use 1/4" ribbon to affected eye once a day. If you develop allergy symptoms, stop & call our office. 05/13/15  Yes Historical Provider, MD  polyethylene glycol powder (GLYCOLAX/MIRALAX) powder Take 17 g by mouth daily as needed for moderate constipation.  12/16/13  Yes Historical Provider, MD  simvastatin (ZOCOR) 20 MG tablet Take 20 mg by mouth at bedtime.    Yes Historical Provider, MD  HYDROcodone-acetaminophen (NORCO/VICODIN) 5-325 MG tablet Take 1-2 tablets by mouth every 6 hours as needed for pain and/or cough. Patient not taking: Reported on 06/14/2015 04/26/15   Elmyra Ricks Pisciotta, PA-C  insulin detemir (LEVEMIR) 100 unit/ml SOLN Inject 0.3 mLs (30 Units total) into the skin at bedtime. Patient not taking: Reported on 06/14/2015 08/12/14   Geradine Girt, DO  ondansetron (ZOFRAN) 4 MG tablet Take 1 tablet (4 mg total) by mouth every 8 (eight) hours as needed for nausea or vomiting. Patient not taking: Reported on 06/14/2015 04/26/15   Elmyra Ricks Pisciotta, PA-C   BP 118/68 mmHg  Pulse 72  Resp 17  SpO2 100% Physical Exam  Constitutional: She is oriented to person, place, and time. She appears well-developed and well-nourished. No distress.  HENT:  Head:  Normocephalic and atraumatic.  Mouth/Throat: Oropharynx is clear and moist.  Eyes: EOM are normal.  Patient essentially blind.  Neck: Normal range of motion. Neck supple.  Cardiovascular: Normal rate, regular rhythm and normal heart sounds.   No murmur heard. Pulmonary/Chest: Effort normal and breath sounds normal. No respiratory distress.  Abdominal: Soft. Bowel sounds are normal. She exhibits no distension.  Musculoskeletal: Normal range of motion.  Neurological: She is alert and oriented to person, place, and time. No cranial nerve deficit. She exhibits normal muscle tone. Coordination normal.  Blind.  Skin: Skin is warm. No rash noted.  Nursing note and vitals reviewed.   ED Course  Procedures (including critical care time) Labs Review Labs Reviewed  BASIC METABOLIC PANEL - Abnormal; Notable for the following:    Glucose, Bld 321 (*)    BUN 33 (*)    Creatinine, Ser 1.53 (*)    GFR calc non Af Amer 37 (*)  GFR calc Af Amer 43 (*)    All other components within normal limits  HEPATIC FUNCTION PANEL - Abnormal; Notable for the following:    Total Protein 6.4 (*)    Albumin 3.1 (*)    AST 14 (*)    Bilirubin, Direct <0.1 (*)    All other components within normal limits  CBG MONITORING, ED - Abnormal; Notable for the following:    Glucose-Capillary 283 (*)    All other components within normal limits  CBC  URINALYSIS, ROUTINE W REFLEX MICROSCOPIC (NOT AT [facility])  MAGNESIUM  PHOSPHORUS  I-STAT CG4 LACTIC ACID, ED   Results for orders placed or performed during the hospital encounter of [id number]  Basic metabolic panel  Result Value Ref Range   Sodium 137 135 - 145 mmol/L   Potassium 4.2 3.5 - 5.1 mmol/L   Chloride 104 101 - 111 mmol/L   CO2 23 22 - 32 mmol/L   Glucose, Bld 321 (H) 65 - 99 mg/dL   BUN 33 (H) 6 - 20 mg/dL   Creatinine, Ser 1.53 (H) 0.44 - 1.00 mg/dL   Calcium 9.2 8.9 - 10.3 mg/dL   GFR calc non Af Amer 37 (L) >60 mL/min   GFR calc Af Amer 43 (L) >60  mL/min   Anion gap 10 5 - 15  CBC  Result Value Ref Range   WBC 8.4 4.0 - 10.5 K/uL   RBC 4.83 3.87 - 5.11 MIL/uL   Hemoglobin 12.6 12.0 - 15.0 g/dL   HCT 37.9 36.0 - 46.0 %   MCV 78.5 78.0 - 100.0 fL   MCH 26.1 26.0 - 34.0 pg   MCHC 33.2 30.0 - 36.0 g/dL   RDW 14.5 11.5 - 15.5 %   Platelets 181 150 - 400 K/uL  Hepatic function panel  Result Value Ref Range   Total Protein 6.4 (L) 6.5 - 8.1 g/dL   Albumin 3.1 (L) 3.5 - 5.0 g/dL   AST 14 (L) 15 - 41 U/L   ALT 14 14 - 54 U/L   Alkaline Phosphatase 90 38 - 126 U/L   Total Bilirubin 0.4 0.3 - 1.2 mg/dL   Bilirubin, Direct <0.1 (L) 0.1 - 0.5 mg/dL   Indirect Bilirubin NOT CALCULATED 0.3 - 0.9 mg/dL  CBG monitoring, ED  Result Value Ref Range   Glucose-Capillary 283 (H) 65 - 99 mg/dL  I-Stat CG4 Lactic Acid, ED  Result Value Ref Range   Lactic Acid, Venous 1.97 0.5 - 2.0 mmol/L     Imaging Review Dg Chest Port 1 View  07/05/2015  CLINICAL DATA:  Pt presents via EMS from home with c/o weakness that she reported started 4 days ago. Pt had a recent change in her BP medication and her diabetic medication as well. Pt denies any N/V, reports some dizziness.H/o asthma, diabetes, HTN. Former smoker. EXAM: PORTABLE CHEST 1 VIEW COMPARISON:  09/14/2014 FINDINGS: The heart size and mediastinal contours are within normal limits. Both lungs are clear. No pleural effusion or pneumothorax. The bony thorax is intact. IMPRESSION: No active disease. Electronically Signed   By: Lajean Manes M.D.   On: 07/05/2015 12:14   I have personally reviewed and evaluated these images and lab results as part of my medical decision-making.   EKG Interpretation   Date/Time:  Sunday July 05 2015 11:14:05 EST Ventricular Rate:  71 PR Interval:  130 QRS Duration: 75 QT Interval:  398 QTC Calculation: 432 R Axis:   30 Text Interpretation:  Sinus  rhythm Confirmed by Ellanore Vanhook  MD, [place]  [phone]) on 07/05/2015 11:42:42 AM      MDM   Final diagnoses:   Hypotension, unspecified hypotension type   Patient with significant hypotension at home and upon arrival. Eventually resolved after receiving 1.5 L of fluid. Patient concerned that there is some mix up on her blood pressure meds at home. Also possible patients may not require the medication she's on.  Extensive workup here without any acute findings no evidence of significant infection urinalysis still pending. No leukocytosis labs without significant abnormality other than the hyperglycemia but no metabolic acidosis. Renal function baseline insufficiency for her. Chest x-ray negative. Patient mentating fine. Patient's blood pressures were in the 70s upon first arrival and then now mid 80s and now in the [id number] systolic range. Patient nontoxic no acute distress. Lactic acid normal.   Contacted hospitalist for observation admission and to get her blood pressure medicines squared away. Hospitalist will admit.    Fredia Sorrow, MD 07/05/15 1341

## 2015-07-05 NOTE — ED Notes (Signed)
Pt presents via EMS from home with c/o weakness that she reported started 4 days ago. Pt had a recent change in her BP medication and her diabetic medication as well. Pt denies any N/V, reports some dizziness. Pt is also legally blind. BP initial of 67 palpated for EMS, last pressure was 104/43 for EMS.

## 2015-07-05 NOTE — H&P (Signed)
[facility] History and Physical  Amy [name] [id number] DOB: 1960/07/01 DOA: 07/05/2015  Referring physician: Fredia Sorrow, M.D. PCP: Velna Hatchet, MD   Chief Complaint: Weakness.  HPI: Amy [name] is a 55 y.o. female  with a past medical history of diabetes mellitus, diabetic neuropathy, legally blind, S/P left eye enucleation, hypertension, chronic renal insufficiency, hyperlipidemia, hypothyroidism, spinal stenosis who comes to the emergency department via EMS due to weakness for the past 4 days. This has been associated with dizziness and palpitations.  Per patient, she had a recent change in her Levemir scheduling from 35 units at bedtime to 35 units in the morning due to frequent nocturnal hypoglycemia. She also thinks that there might be a mixup with her antihypertensive medications as well. It was noticed that the patient is on furosemide 20 mg as needed for LEs edema,  Avapro 150 mg by mouth daily for hypertension, but the patient is also on midodrine 10 mg by mouth 3 times a day for hypotension. She states that since she is unable to see, she does not know what she has been taking lately, but thinks that there might be some confusion with her medications set up by home health.   She denies fever, earache, sore throat, headache, chest pain, abdominal pain, nausea, emesis, recent swelling of the lower extremities. Pertinent positive symptoms are occasional mild dyspnea, productive cough, increased urinary frequency and diarrhea after taking Linzess for constipation.  When seen in the emergency department, the patient was in no acute distress. She stated that she felt better after the IV fluid bolus was given. Workup, other than having mild increase in BUN and creatinine, has been unremarkable.   Review of Systems:  Constitutional:  Positive chills, fatigue.  No weight loss, night sweats, Fevers. HEENT:  No headaches, Difficulty swallowing,Tooth/dental  problems,Sore throat,  No sneezing, itching, ear ache, nasal congestion, post nasal drip,  Cardio-vascular:  Positive dizziness, palpitations , occasional swelling in lower extremities. No chest pain, Orthopnea, PND, anasarca.  GI:  Frequent constipation, occasional diarrhea due to constipation treatment, denies abdominal pain, nausea, emesis. She is not sure if she has had melena or hematochezia because she is legally blind, but this has not happened to the best of her knowledge. Resp:  Mild dyspnea, occasional productive cough, no wheezing. Skin:  no rash or lesions.  GU:  Positive frequency no dysuria, change in color of urine, no urgency. No flank pain.  Musculoskeletal:  Occasional back pain.  Psych:  No change in mood or affect. No depression or anxiety. No memory loss.   Past Medical History  Diagnosis Date  . Asthma   . Diabetes mellitus   . Hypertension   . Blind   . Hypothyroidism   . Hyperlipidemia   . Diabetic neuropathy ([place])   . Blindness and low vision     left eye glass eye,  legally blind in right eye  . Spinal stenosis    Past Surgical History  Procedure Laterality Date  . Cesarean section      x 2  . Abdominal hysterectomy    . Cholecystectomy    . Enucleation     Social History:  reports that she quit smoking about 23 years ago. She has never used smokeless tobacco. She reports that she does not drink alcohol or use illicit drugs.  Allergies  Allergen Reactions  . Codeine Nausea Only  . Iohexol      Code: HIVES, Desc: pt developed itching and hives  along with nasal congestion; needs 13 hour premeds for future studies, Onset Date: [id number]   . Penicillins Hives    Has patient had a PCN reaction causing immediate rash, facial/tongue/throat swelling, SOB or lightheadedness with hypotension: unknown Has patient had a PCN reaction causing severe rash involving mucus membranes or skin necrosis:unknown Has patient had a PCN reaction that required  hospitalization unknown Has patient had a PCN reaction occurring within the last 10 years:unknown If all of the above answers are "NO", then may proceed with Cephalosporin use.   . Sulfa Antibiotics Nausea And Vomiting    Family History  Problem Relation Age of Onset  . Diabetes Sister     Prior to Admission medications   Medication Sig Start Date End Date Taking? Authorizing Provider  albuterol (PROVENTIL HFA;VENTOLIN HFA) 108 (90 BASE) MCG/ACT inhaler Inhale 2 puffs into the lungs every 4 (four) hours as needed. For shortness of breath   Yes Historical Provider, MD  aspirin EC 81 MG tablet Take 81 mg by mouth daily.   Yes Historical Provider, MD  clobetasol ointment (TEMOVATE) AB-123456789 % Apply 1 application topically 2 (two) times daily. 06/05/15  Yes Historical Provider, MD  diclofenac sodium (VOLTAREN) 1 % GEL Apply 2 g topically every 6 (six) hours as needed (for pain). 11/03/14  Yes Theodis Blaze, MD  Dulaglutide (TRULICITY [place]) Inject 1.7 mLs into the skin once a week.    Yes Historical Provider, MD  esomeprazole (NEXIUM) 40 MG capsule Take 40 mg by mouth 2 (two) times daily before a meal.    Yes Historical Provider, MD  furosemide (LASIX) 20 MG tablet Take 1 tablet (20 mg total) by mouth daily as needed for edema. 11/03/14  Yes Theodis Blaze, MD  HUMALOG KWIKPEN 100 UNIT/ML KiwkPen Inject 5 Units into the skin 3 (three) times daily.  12/26/13  Yes Historical Provider, MD  irbesartan (AVAPRO) 150 MG tablet Take 150 mg by mouth daily.  06/05/15  Yes Historical Provider, MD  LEVEMIR FLEXTOUCH 100 UNIT/ML Pen Inject 35 Units into the skin every morning.  06/05/15  Yes Historical Provider, MD  levothyroxine (SYNTHROID, LEVOTHROID) 150 MCG tablet Take 150 mcg by mouth daily. 10/28/14  Yes Historical Provider, MD  lidocaine (LIDODERM) 5 % Place 1 patch onto the skin daily. Remove & Discard patch within 12 hours or as directed by MD 04/26/15  Yes Elmyra Ricks Pisciotta, PA-C  Linaclotide (LINZESS) 145 MCG  CAPS capsule Take 145 mcg by mouth daily as needed (constipation).    Yes Historical Provider, MD  LYRICA 150 MG capsule Take 150 mg by mouth 3 (three) times daily. 10/28/14  Yes Historical Provider, MD  midodrine (PROAMATINE) 10 MG tablet Take 10 mg by mouth 3 (three) times daily.  05/12/15  Yes Historical Provider, MD  montelukast (SINGULAIR) 10 MG tablet Take 10 mg by mouth at bedtime.   Yes Historical Provider, MD  naproxen sodium (ANAPROX) 220 MG tablet Take 220 mg by mouth 2 (two) times daily as needed (pain). Reported on 06/14/2015   Yes Historical Provider, MD  neomycin-polymyxin b-dexamethasone (MAXITROL) 3.5-10000-0.1 OINT Use 1/4" ribbon to affected eye once a day. If you develop allergy symptoms, stop & call our office. 05/13/15  Yes Historical Provider, MD  polyethylene glycol powder (GLYCOLAX/MIRALAX) powder Take 17 g by mouth daily as needed for moderate constipation.  12/16/13  Yes Historical Provider, MD  simvastatin (ZOCOR) 20 MG tablet Take 20 mg by mouth at bedtime.    Yes Historical Provider, MD  HYDROcodone-acetaminophen (NORCO/VICODIN) 5-325 MG tablet Take 1-2 tablets by mouth every 6 hours as needed for pain and/or cough. Patient not taking: Reported on 06/14/2015 04/26/15   Elmyra Ricks Pisciotta, PA-C  insulin detemir (LEVEMIR) 100 unit/ml SOLN Inject 0.3 mLs (30 Units total) into the skin at bedtime. Patient not taking: Reported on 06/14/2015 08/12/14   Geradine Girt, DO  ondansetron (ZOFRAN) 4 MG tablet Take 1 tablet (4 mg total) by mouth every 8 (eight) hours as needed for nausea or vomiting. Patient not taking: Reported on 06/14/2015 04/26/15   Monico Blitz, PA-C   Physical Exam: Filed Vitals:   07/05/15 1130 07/05/15 1145 07/05/15 1215 07/05/15 1245  BP: 86/58 107/69 112/69 118/68  Pulse: 70 70 67 72  Resp: 16 13 16 17   SpO2: 98% 100% 95% 100%    Wt Readings from Last 3 Encounters:  06/14/15 80.332 kg (177 lb 1.6 oz)  04/26/15 77.111 kg (170 lb)  04/17/15 78.019 kg  (172 lb)    General:  Appears calm and comfortable Eyes: Prosthetic left eye, normal lids, irises & conjunctiva ENT: grossly normal hearing, lips & tongue, dentition is absent, oral mucosa is mildly dry. Neck: no LAD, masses or thyromegaly Cardiovascular: RRR, no m/r/g. No LE edema. Telemetry: SR, no arrhythmias  Respiratory: CTA bilaterally, no w/r/r. Normal respiratory effort. Abdomen: soft, BS positive, mild suprapubic tenderness, no guarding, no rebound. Skin: no rash or induration seen on limited exam Musculoskeletal: grossly normal tone BUE/BLE Psychiatric: grossly normal mood and affect, speech fluent and appropriate Neurologic: Awake, alert, oriented 3, moves all extremities, no sensory changes on gross                      neurological exam.           Labs on Admission:  Basic Metabolic Panel:  Recent Labs Lab 07/05/15 1123  NA 137  K 4.2  CL 104  CO2 23  GLUCOSE 321*  BUN 33*  CREATININE 1.53*  CALCIUM 9.2   Liver Function Tests:  Recent Labs Lab 07/05/15 1203  AST 14*  ALT 14  ALKPHOS 90  BILITOT 0.4  PROT 6.4*  ALBUMIN 3.1*   CBC:  Recent Labs Lab 07/05/15 1123  WBC 8.4  HGB 12.6  HCT 37.9  MCV 78.5  PLT 181    CBG:  Recent Labs Lab 07/05/15 1050  GLUCAP 283*    Radiological Exams on Admission: Dg Chest Port 1 View  07/05/2015  CLINICAL DATA:  Pt presents via EMS from home with c/o weakness that she reported started 4 days ago. Pt had a recent change in her BP medication and her diabetic medication as well. Pt denies any N/V, reports some dizziness.H/o asthma, diabetes, HTN. Former smoker. EXAM: PORTABLE CHEST 1 VIEW COMPARISON:  09/14/2014 FINDINGS: The heart size and mediastinal contours are within normal limits. Both lungs are clear. No pleural effusion or pneumothorax. The bony thorax is intact. IMPRESSION: No active disease. Electronically Signed   By: Lajean Manes M.D.   On: 07/05/2015 12:14  Echocardiogram  08/07/2012  Indications:   TIA 435.9.  ------------------------------------------------------------ History:  PMH: Dizziness. Blindness. Orthostatic hypotension. Hypernatremia. Asthma. Risk factors: Diabetes mellitus.  ------------------------------------------------------------ Study Conclusions  Left ventricle: The cavity size was normal. There was mild concentric hypertrophy. Systolic function was vigorous. The estimated ejection fraction was in the range of 65% to 70%. Wall motion was normal; there were no regional wall motion abnormalities. Features are consistent with a pseudonormal left ventricular  filling pattern, with concomitant abnormal relaxation and increased filling pressure (grade 2 diastolic dysfunction). Doppler parameters are consistent with elevated mean left atrial filling pressure.  EKG: Independently reviewed. Vent. rate 71 BPM PR interval 130 ms QRS duration 75 ms QT/QTc 398/432 ms P-R-T axes 42 30 34 Sinus rhythm.  Assessment/Plan Principal Problem:   Hypotension Admit to telemetry. Hold antihypertensive and midorine. Monitor blood pressure frequently. Continue gentle IV hydration. Monitor intake and output.  Active Problems:   Dehydration  AKI Mild increase in BUN/creatinine from baseline. Continue gentle IV hydration. Monitor BUN and creatinine.    Blindness Continue supportive care.    DM type 1, not at goal, causing eye disease ([place]) Carbohydrate modified diet. Continue Levemir 35 SQ units in the morning. CBG monitoring with regular insulin sliding scale while in the hospital.    Asthma As symptomatic at this time. Continue albuterol MDI as needed.    Hypothyroidism Continue levothyroxine. Monitor TSH periodically.     Diabetic neuropathy. Continue Lyrica 150 mg by mouth 3 times a day.      Spinal stenosis, multilevel Continue Lyrica 150 mg by mouth 3 times a day. Analgesics as needed.     Hypertension Antihypertensive medications are on hold. Monitor blood pressure.   Code Status: Full code. DVT Prophylaxis: Lovenox SQ. Family Communication: Her sister was present in the room. Disposition Plan: Admit to telemetry, continue on rehydration with IV fluids, patient's relatives to bring her home meds for evaluation.  Time spent: Over 70 minutes were used in the process of his admission.  Reubin Milan, M.D. [facility] Pager [phone].

## 2015-07-05 NOTE — ED Notes (Signed)
Bed: RESA Expected date: 07/05/15 Expected time: 10:39 AM Means of arrival: Ambulance Comments: bp 70's, CBG 366 no IV access

## 2015-07-05 NOTE — Progress Notes (Addendum)
Pt stated she felt funny at change of shift report and asked [place] to check her BP. BP 83/54, HR 75. K.Shorr paged, orders given at 2000 for 500 cc saline bolus. Will continue to monitor closely.

## 2015-07-05 NOTE — ED Notes (Signed)
Bed: WA17 Expected date:  Expected time:  Means of arrival:  Comments: 

## 2015-07-06 ENCOUNTER — Observation Stay (HOSPITAL_BASED_OUTPATIENT_CLINIC_OR_DEPARTMENT_OTHER): Payer: Medicare Other

## 2015-07-06 DIAGNOSIS — I9589 Other hypotension: Secondary | ICD-10-CM | POA: Diagnosis not present

## 2015-07-06 DIAGNOSIS — E785 Hyperlipidemia, unspecified: Secondary | ICD-10-CM

## 2015-07-06 DIAGNOSIS — R42 Dizziness and giddiness: Secondary | ICD-10-CM | POA: Diagnosis not present

## 2015-07-06 DIAGNOSIS — I959 Hypotension, unspecified: Secondary | ICD-10-CM

## 2015-07-06 DIAGNOSIS — I509 Heart failure, unspecified: Secondary | ICD-10-CM | POA: Diagnosis not present

## 2015-07-06 DIAGNOSIS — R531 Weakness: Secondary | ICD-10-CM

## 2015-07-06 DIAGNOSIS — E1169 Type 2 diabetes mellitus with other specified complication: Secondary | ICD-10-CM | POA: Diagnosis not present

## 2015-07-06 DIAGNOSIS — N183 Chronic kidney disease, stage 3 (moderate): Secondary | ICD-10-CM | POA: Diagnosis not present

## 2015-07-06 DIAGNOSIS — E038 Other specified hypothyroidism: Secondary | ICD-10-CM | POA: Diagnosis not present

## 2015-07-06 DIAGNOSIS — I10 Essential (primary) hypertension: Secondary | ICD-10-CM | POA: Diagnosis not present

## 2015-07-06 LAB — GLUCOSE, CAPILLARY
GLUCOSE-CAPILLARY: 252 mg/dL — AB (ref 65–99)
GLUCOSE-CAPILLARY: 186 mg/dL — AB (ref 65–99)
GLUCOSE-CAPILLARY: 236 mg/dL — AB (ref 65–99)
Glucose-Capillary: 310 mg/dL — ABNORMAL HIGH (ref 65–99)

## 2015-07-06 LAB — COMPREHENSIVE METABOLIC PANEL
ALT: 12 U/L — AB (ref 14–54)
AST: 12 U/L — AB (ref 15–41)
Albumin: 2.9 g/dL — ABNORMAL LOW (ref 3.5–5.0)
Alkaline Phosphatase: 87 U/L (ref 38–126)
Anion gap: 8 (ref 5–15)
BILIRUBIN TOTAL: 0.5 mg/dL (ref 0.3–1.2)
BUN: 26 mg/dL — AB (ref 6–20)
CO2: 22 mmol/L (ref 22–32)
CREATININE: 1.09 mg/dL — AB (ref 0.44–1.00)
Calcium: 8.6 mg/dL — ABNORMAL LOW (ref 8.9–10.3)
Chloride: 109 mmol/L (ref 101–111)
GFR, EST NON AFRICAN AMERICAN: 56 mL/min — AB (ref >60)
Glucose, Bld: 372 mg/dL — ABNORMAL HIGH (ref 65–99)
Potassium: 5.1 mmol/L (ref 3.5–5.1)
Sodium: 139 mmol/L (ref 135–145)
TOTAL PROTEIN: 6.1 g/dL — AB (ref 6.5–8.1)

## 2015-07-06 LAB — CBC
HCT: 36.2 % (ref 36.0–46.0)
Hemoglobin: 11.7 g/dL — ABNORMAL LOW (ref 12.0–15.0)
MCH: 25.7 pg — AB (ref 26.0–34.0)
MCHC: 32.3 g/dL (ref 30.0–36.0)
MCV: 79.4 fL (ref 78.0–100.0)
PLATELETS: 144 10*3/uL — AB (ref 150–400)
RBC: 4.56 MIL/uL (ref 3.87–5.11)
RDW: 14.8 % (ref 11.5–15.5)
WBC: 7.2 10*3/uL (ref 4.0–10.5)

## 2015-07-06 MED ORDER — MIDODRINE HCL 5 MG PO TABS
10.0000 mg | ORAL_TABLET | Freq: Three times a day (TID) | ORAL | Status: DC
  Administered 2015-07-06 – 2015-07-08 (×6): 10 mg via ORAL
  Filled 2015-07-06 (×8): qty 2

## 2015-07-06 MED ORDER — INSULIN ASPART 100 UNIT/ML ~~LOC~~ SOLN
5.0000 [IU] | Freq: Three times a day (TID) | SUBCUTANEOUS | Status: DC
  Administered 2015-07-06 – 2015-07-08 (×5): 5 [IU] via SUBCUTANEOUS

## 2015-07-06 NOTE — Progress Notes (Signed)
Inpatient Diabetes Program Recommendations  AACE/ADA: New Consensus Statement on Inpatient Glycemic Control (2015)  Target Ranges:  Prepandial:   less than 140 mg/dL      Peak postprandial:   less than 180 mg/dL (1-2 hours)      Critically ill patients:  140 - 180 mg/dL   Review of Glycemic Control  Results for Amy [name], TRACH (MRN [name]) as of 07/06/2015 13:05  Ref. Range 07/05/2015 10:50 07/05/2015 17:02 07/05/2015 20:13 07/06/2015 07:43 07/06/2015 11:48  Glucose-Capillary Latest Ref Range: 65-99 mg/dL 283 (H) 390 (H) 235 (H) 310 (H) 252 (H)  Did not receive Levemir yesterday. Restarted home dose this am.   Inpatient Diabetes Program Recommendations:  Insulin - Meal Coverage: Add Novolog 4 units tidwc for meal coverage insulin HgbA1C: Need updated HgbA1C   Will continue to follow. Thank you. [name], RD, LDN, CDE Inpatient Diabetes Coordinator [phone]

## 2015-07-06 NOTE — Progress Notes (Signed)
Pt BP 96/58 post bolus. On-call notified, will continue to monitor closely.

## 2015-07-06 NOTE — Progress Notes (Signed)
Patient ID: Amy [name], female   DOB: Nov 03, 1960, 55 y.o.   MRN: [id number] [facility] PROGRESS NOTE  Amy [name] [id number] DOB: 19-Mar-1961 DOA: 07/05/2015 PCP: Velna Hatchet, MD  Brief narrative:    55 y.o. female with a past medical history of diabetes mellitus, diabetic neuropathy, legally blind, S/P left eye enucleation, hypertension, chronic renal insufficiency, hyperlipidemia, hypothyroidism, spinal stenosis who presented to [facility] ED with weakness and lightheadedness for the past 4 days. She does report not being sure which medication she was taking for blood pressure. Her BP on admission was 70/61. Blood wokr showed Cr 1.53 otherwise unremarkable.  Assessment/Plan:    Principal Problem:   Hypotension / Generalized weakness / Lightheadedness - No signs of acute infectious precess - Weakness and lightheadedness likely from hypotension - Resume midodrine - Hold off on all antihypertensives  - 2 D ECHO is pending - PT eval in progress  Active Problems:   DM type 1 with diabetic neuropathy with long term insulin use  (HCC) - Her current insulin regimen is Levemir 35 units in am and SSI - CBG's in past 24 hours: 235, 310 and 252 - Add novolog 5 units TIDAC - Appreciate DM coordinator consult - Continue lyrica for neuropathy    Dyslipidemia associated with DM - Continue statin therapy    CKD stage 3 - Baseline Cr 1.34 - Cr on this admission 1.53 but this am better, 1.09    Anemia of chronic renal failure stage 3 - Stable Hemoglobin      DVT Prophylaxis  - Lovenox subQ   Code Status: Full.  Family Communication:  plan of care discussed with the patient Disposition Plan: Needs PT eval, home likely by 2/24  IV access:  Peripheral IV  Procedures and diagnostic studies:    Ct Abdomen Pelvis Wo Contrast 06/14/2015  No acute abnormality in the abdomen/pelvis. Equivocal wall thickening of the urinary bladder, appear similar to prior exam and likely  chronic.  Dg Chest Port 1 View 07/05/2015  No active disease.   Medical Consultants:  None   Other Consultants:  PT  IAnti-Infectives:   None    Leisa Lenz, MD  [facility] Pager [phone]  Time spent in minutes: 25 minutes  If 7PM-7AM, please contact night-coverage [email or web address] Password [facility] 07/06/2015, 4:26 PM      HPI/Subjective: No acute overnight events. Patient reports still feeling lightheaded.  Objective: Filed Vitals:   07/05/15 2140 07/06/15 0531 07/06/15 1319 07/06/15 1327  BP: 96/58 128/73 78/45 90/58   Pulse:  75 77   Temp:  97.4 F (36.3 C) 97.4 F (36.3 C)   TempSrc:  Oral Oral   Resp:  18 18   Height:      Weight:      SpO2:  98% 99%     Intake/Output Summary (Last 24 hours) at 07/06/15 1626 Last data filed at 07/06/15 1622  Gross per 24 hour  Intake 3318.33 ml  Output    450 ml  Net 2868.33 ml    Exam:   General:  Pt is alert, follows commands appropriately, not in acute distress  Cardiovascular: Regular rate and rhythm, S1/S2 (+)  Respiratory: Clear to auscultation bilaterally, no wheezing, no crackles, no rhonchi  Abdomen: Soft, non tender, non distended, bowel sounds present  Extremities: No edema, pulses palpable bilaterally  Neuro: Grossly nonfocal  Data Reviewed: Basic Metabolic Panel:  Recent Labs Lab 07/05/15 1123 07/06/15 0540  NA 137 139  K 4.2 5.1  CL 104  109  CO2 23 22  GLUCOSE 321* 372*  BUN 33* 26*  CREATININE 1.53* 1.09*  CALCIUM 9.2 8.6*  MG 1.7  --   PHOS 2.2*  --    Liver Function Tests:  Recent Labs Lab 07/05/15 1203 07/06/15 0540  AST 14* 12*  ALT 14 12*  ALKPHOS 90 87  BILITOT 0.4 0.5  PROT 6.4* 6.1*  ALBUMIN 3.1* 2.9*   No results for input(s): LIPASE, AMYLASE in the last 168 hours. No results for input(s): AMMONIA in the last 168 hours. CBC:  Recent Labs Lab 07/05/15 1123 07/06/15 0540  WBC 8.4 7.2  HGB 12.6 11.7*  HCT 37.9 36.2  MCV 78.5 79.4  PLT 181 144*    Cardiac Enzymes: No results for input(s): CKTOTAL, CKMB, CKMBINDEX, TROPONINI in the last 168 hours. BNP: Invalid input(s): POCBNP CBG:  Recent Labs Lab 07/05/15 1050 07/05/15 1702 07/05/15 2013 07/06/15 0743 07/06/15 1148  GLUCAP 283* 390* 235* 310* 252*    Recent Results (from the past 240 hour(s))  MRSA PCR Screening     Status: None   Collection Time: 07/05/15  5:03 PM  Result Value Ref Range Status   MRSA by PCR NEGATIVE NEGATIVE Final     Scheduled Meds: . aspirin EC  81 mg Oral Daily  . enoxaparin (LOVENOX) injection  40 mg Subcutaneous Q24H  . insulin aspart  0-15 Units Subcutaneous TID WC  . insulin detemir  35 Units Subcutaneous q morning - 10a  . levothyroxine  150 mcg Oral QAC breakfast  . lidocaine  1 patch Transdermal Q24H  . montelukast  10 mg Oral QHS  . pantoprazole  40 mg Oral Daily  . pregabalin  150 mg Oral TID  . simvastatin  20 mg Oral QHS   Continuous Infusions: . sodium chloride 75 mL/hr at 07/06/15 1222

## 2015-07-06 NOTE — Progress Notes (Signed)
Paged md to make aware of pt's BP 78/45 (dinamap), 90/58 (manual), c/o slight nausea and dizziness

## 2015-07-06 NOTE — Progress Notes (Signed)
  Echocardiogram 2D Echocardiogram has been performed.  Jennette Dubin 07/06/2015, 1:33 PM

## 2015-07-06 NOTE — Progress Notes (Signed)
Dr. Charlies Silvers notified of patient's BP in the 90s.

## 2015-07-07 DIAGNOSIS — N183 Chronic kidney disease, stage 3 (moderate): Secondary | ICD-10-CM | POA: Diagnosis not present

## 2015-07-07 DIAGNOSIS — I9589 Other hypotension: Secondary | ICD-10-CM

## 2015-07-07 DIAGNOSIS — E1169 Type 2 diabetes mellitus with other specified complication: Secondary | ICD-10-CM | POA: Diagnosis not present

## 2015-07-07 DIAGNOSIS — R531 Weakness: Secondary | ICD-10-CM | POA: Diagnosis not present

## 2015-07-07 DIAGNOSIS — I959 Hypotension, unspecified: Secondary | ICD-10-CM | POA: Diagnosis not present

## 2015-07-07 LAB — GLUCOSE, CAPILLARY
GLUCOSE-CAPILLARY: 184 mg/dL — AB (ref 65–99)
GLUCOSE-CAPILLARY: 114 mg/dL — AB (ref 65–99)
Glucose-Capillary: 235 mg/dL — ABNORMAL HIGH (ref 65–99)
Glucose-Capillary: 275 mg/dL — ABNORMAL HIGH (ref 65–99)
Glucose-Capillary: 56 mg/dL — ABNORMAL LOW (ref 65–99)

## 2015-07-07 MED ORDER — DOCUSATE SODIUM 100 MG PO CAPS
100.0000 mg | ORAL_CAPSULE | Freq: Two times a day (BID) | ORAL | Status: DC
  Administered 2015-07-07 – 2015-07-08 (×2): 100 mg via ORAL
  Filled 2015-07-07 (×2): qty 1

## 2015-07-07 MED ORDER — DEXTROSE 50 % IV SOLN
INTRAVENOUS | Status: AC
  Administered 2015-07-07: 50 mL
  Filled 2015-07-07: qty 50

## 2015-07-07 NOTE — Care Management Note (Signed)
Case Management Note  Patient Details  Name: Amy [name] MRN: [name] Date of Birth: 19-Mar-1961  Subjective/Objective:   PT recc HHPT. HHRN-disease mgmnt-Patient chose [facility] rep Santiago Glad aware of [place] orders.                  Action/Plan:d/c plan home w/HHC.   Expected Discharge Date:                  Expected Discharge Plan:  [place]  In-House Referral:     Discharge planning Services  CM Consult  Post Acute Care Choice:    Choice offered to:  Patient  DME Arranged:    DME Agency:     HH Arranged:  RN, PT [place] Agency:  [place]  Status of Service:  In process, will continue to follow  Medicare Important Message Given:    Date Medicare IM Given:    Medicare IM give by:    Date Additional Medicare IM Given:    Additional Medicare Important Message give by:     If discussed at [place] of Stay Meetings, dates discussed:    Additional Comments:  Dessa Phi, RN 07/07/2015, 2:21 PM

## 2015-07-07 NOTE — Progress Notes (Signed)
Patient c/o dizziness, cbg 56. D50 administered. Will recheck

## 2015-07-07 NOTE — Progress Notes (Signed)
Patient ID: Amy [name], female   DOB: [birth year], 55 y.o.   MRN: [name] [facility] PROGRESS NOTE  Amy [name] [id number] DOB: [birth year] DOA: 07/05/2015 PCP: Velna Hatchet, MD  Brief narrative:    55 y.o. female with a past medical history of diabetes mellitus, diabetic neuropathy, legally blind, S/P left eye enucleation, hypertension, chronic renal insufficiency, hyperlipidemia, hypothyroidism, spinal stenosis who presented to [facility] ED with weakness and lightheadedness for the past 4 days. She does report not being sure which medication she was taking for blood pressure. Her BP on admission was 70/61. Blood wokr showed Cr 1.53 otherwise unremarkable.  Assessment/Plan:    Principal Problem:   Hypotension / Generalized weakness / Lightheadedness - No signs of acute infectious precess - BP now stable on midodrine only - 2 D ECHO showed normal EF with grade 2 DD - PT eval pending   Active Problems:   DM type 1 with diabetic neuropathy with long term insulin use  (HCC) - Continue Levemir 35 units in am, increase novolog to 8 units TID and SSI - CBG's in past 24 hours: 236, 235, 275 - Appreciate DM coordinator consult - Continue lyrica for neuropathy    Dyslipidemia associated with DM - Continue statin therapy    CKD stage 3 - Baseline Cr 1.34 - Cr on this admission 1.53 but has improved with hydration, Cr is 1.09 on 2/20    Anemia of chronic renal failure stage 3 - Stable hemoglobin, 11.7 - Check CBC in am   DVT Prophylaxis  - Lovenox subQ in hospital   Code Status: Full.  Family Communication:  plan of care discussed with the patient Disposition Plan: Needs PT eval, home likely by 2/22  IV access:  Peripheral IV  Procedures and diagnostic studies:    Ct Abdomen Pelvis Wo Contrast 06/14/2015  No acute abnormality in the abdomen/pelvis. Equivocal wall thickening of the urinary bladder, appear similar to prior exam and likely chronic.  Dg Chest Port 1  View 07/05/2015  No active disease.   Medical Consultants:  None   Other Consultants:  PT  IAnti-Infectives:   None    Leisa Lenz, MD  [facility] Pager [phone]  Time spent in minutes: 15 minutes  If 7PM-7AM, please contact night-coverage [email or web address] Password [facility] 07/07/2015, 4:21 PM      HPI/Subjective: No acute overnight events. Patient reports back pain.  Objective: Filed Vitals:   07/06/15 2201 07/07/15 0551 07/07/15 0900 07/07/15 1421  BP: 128/62 147/74 123/62 147/64  Pulse: 73 75 74 66  Temp: 98.3 F (36.8 C) 98.4 F (36.9 C)  98.6 F (37 C)  TempSrc: Oral Oral  Oral  Resp: 18 18 18 20   Height:      Weight:      SpO2: 97% 97% 98% 98%    Intake/Output Summary (Last 24 hours) at 07/07/15 1621 Last data filed at 07/07/15 1047  Gross per 24 hour  Intake 1811.25 ml  Output   1750 ml  Net  61.25 ml    Exam:   General:  Pt is not in acute distress  Cardiovascular: Rate controlled, S1/S2 appreciated   Respiratory: No wheezing, no crackles, no rhonchi  Abdomen: (+) BS, non tender   Extremities: No swelling, palpable pulses   Neuro: Nonfocal  Data Reviewed: Basic Metabolic Panel:  Recent Labs Lab 07/05/15 1123 07/06/15 0540  NA 137 139  K 4.2 5.1  CL 104 109  CO2 23 22  GLUCOSE 321* 372*  BUN  33* 26*  CREATININE 1.53* 1.09*  CALCIUM 9.2 8.6*  MG 1.7  --   PHOS 2.2*  --    Liver Function Tests:  Recent Labs Lab 07/05/15 1203 07/06/15 0540  AST 14* 12*  ALT 14 12*  ALKPHOS 90 87  BILITOT 0.4 0.5  PROT 6.4* 6.1*  ALBUMIN 3.1* 2.9*   No results for input(s): LIPASE, AMYLASE in the last 168 hours. No results for input(s): AMMONIA in the last 168 hours. CBC:  Recent Labs Lab 07/05/15 1123 07/06/15 0540  WBC 8.4 7.2  HGB 12.6 11.7*  HCT 37.9 36.2  MCV 78.5 79.4  PLT 181 144*   Cardiac Enzymes: No results for input(s): CKTOTAL, CKMB, CKMBINDEX, TROPONINI in the last 168 hours. BNP: Invalid input(s):  POCBNP CBG:  Recent Labs Lab 07/06/15 1148 07/06/15 1633 07/06/15 2159 07/07/15 0746 07/07/15 1215  GLUCAP 252* 186* 236* 235* 275*    Recent Results (from the past 240 hour(s))  MRSA PCR Screening     Status: None   Collection Time: 07/05/15  5:03 PM  Result Value Ref Range Status   MRSA by PCR NEGATIVE NEGATIVE Final     Scheduled Meds: . aspirin EC  81 mg Oral Daily  . enoxaparin (LOVENOX) injection  40 mg Subcutaneous Q24H  . insulin aspart  0-15 Units Subcutaneous TID WC  . insulin detemir  35 Units Subcutaneous q morning - 10a  . levothyroxine  150 mcg Oral QAC breakfast  . lidocaine  1 patch Transdermal Q24H  . montelukast  10 mg Oral QHS  . pantoprazole  40 mg Oral Daily  . pregabalin  150 mg Oral TID  . simvastatin  20 mg Oral QHS   Continuous Infusions: . sodium chloride 75 mL/hr at 07/07/15 0047

## 2015-07-07 NOTE — Progress Notes (Addendum)
Blood sugar 54. Patient stated she felt, "a little dizzy". D50 given.  MD notified.   184 upon recheck.  Per Dr. Charlies Silvers, ok to hold dinnertime insulin.

## 2015-07-07 NOTE — Care Management Obs Status (Signed)
[place] NOTIFICATION   Patient Details  Name: Amy [name] MRN: [name] Date of Birth: Apr 21, 1961   Medicare Observation Status Notification Given:  Yes    MahabirJuliann Pulse, RN 07/07/2015, 1:51 PM

## 2015-07-07 NOTE — Evaluation (Signed)
Physical Therapy Evaluation Patient Details Name: Amy [name] MRN: [name] DOB: 08/01/1960 Today's Date: 07/07/2015   History of Present Illness  55 y.o. female with a past medical history of diabetes mellitus, diabetic neuropathy, legally blind, S/P left eye enucleation, hypertension, chronic renal insufficiency, hyperlipidemia, hypothyroidism, spinal stenosis who presented to [facility] ED 07/05/15 with weakness and lightheadedness   Clinical Impression  Pt admitted with above diagnosis. Pt currently with functional limitations due to the deficits listed below (see PT Problem List). Pt will benefit from skilled PT to increase their independence and safety with mobility to allow discharge to the venue listed below.  Will continue to follow, HHPT vs no f/u dependent on progress;      Follow Up Recommendations Home health PT;No PT follow up (dependent on progress)    Equipment Recommendations  None recommended by PT    Recommendations for Other Services       Precautions / Restrictions Precautions Precautions: Fall Precaution Comments: pt is blind      Mobility  Bed Mobility Overal bed mobility: Needs Assistance Bed Mobility: Supine to Sit     Supine to sit: Supervision     General bed mobility comments: for safety, management of bed settings since pt is blind  Transfers Overall transfer level: Needs assistance Equipment used: None Transfers: Sit to/from Stand Sit to Stand: Min guard         General transfer comment: x2 pt with initial dizziness which subsided quickly; min/guard to steady in standing initially; verbal cues d/t unfamiliar environment  Ambulation/Gait Ambulation/Gait assistance: Min assist Ambulation Distance (Feet): 80 Feet         General Gait Details: pt requiring min to min/guard assist for balance, uses blind cane for gait; LOB x 1 with min/mod to recover; verbal cues throughout d/t unfamiliar environment  Stairs            Wheelchair  Mobility    Modified Rankin (Stroke Patients Only)       Balance Overall balance assessment: Needs assistance   Sitting balance-Leahy Scale: Good       Standing balance-Leahy Scale: Fair                               Pertinent Vitals/Pain Pain Assessment: 0-10 Pain Location: diffuse pain, back, LEs Pain Descriptors / Indicators: Aching Pain Intervention(s): Limited activity within patient's tolerance;Monitored during session    Home Living Family/patient expects to be discharged to:: Private residence Living Arrangements: Alone   Type of Home: Apartment Home Access: Level entry     Home Layout: One level Home Equipment: Walker - 4 wheels;Other (comment);Bedside commode Additional Comments: blind cane     Prior Function Level of Independence: Independent with assistive device(s)         Comments: has an aide Monday through Sat 8-2; Sundays 10-12     Hand Dominance        Extremity/Trunk Assessment   Upper Extremity Assessment: Overall WFL for tasks assessed;Defer to OT evaluation           Lower Extremity Assessment: Overall WFL for tasks assessed         Communication   Communication: No difficulties  Cognition Arousal/Alertness: Awake/alert Behavior During Therapy: WFL for tasks assessed/performed Overall Cognitive Status: Within Functional Limits for tasks assessed                      General Comments  Exercises        Assessment/Plan    PT Assessment Patient needs continued PT services  PT Diagnosis Difficulty walking   PT Problem List Decreased activity tolerance;Decreased balance;Decreased mobility  PT Treatment Interventions DME instruction;Gait training;Functional mobility training;Therapeutic activities;Patient/family education;Therapeutic exercise   PT Goals (Current goals can be found in the Care Plan section) Acute Rehab PT Goals Patient Stated Goal: to return to I  PT Goal Formulation: With  patient Time For Goal Achievement: 07/14/15 Potential to Achieve Goals: Good    Frequency Min 3X/week   Barriers to discharge        Co-evaluation               End of Session Equipment Utilized During Treatment: Gait belt Activity Tolerance: Patient tolerated treatment well Patient left: in chair;with call bell/phone within reach;with family/visitor present           Time: 1120-1140 PT Time Calculation (min) (ACUTE ONLY): 20 min   Charges:   PT Evaluation $PT Eval Moderate Complexity: 1 Procedure     PT G Codes:        Amy [name] [birth year]19, 12:30 PM

## 2015-07-08 DIAGNOSIS — I959 Hypotension, unspecified: Secondary | ICD-10-CM | POA: Diagnosis not present

## 2015-07-08 DIAGNOSIS — I10 Essential (primary) hypertension: Secondary | ICD-10-CM | POA: Diagnosis not present

## 2015-07-08 DIAGNOSIS — E038 Other specified hypothyroidism: Secondary | ICD-10-CM

## 2015-07-08 DIAGNOSIS — E785 Hyperlipidemia, unspecified: Secondary | ICD-10-CM | POA: Diagnosis not present

## 2015-07-08 DIAGNOSIS — I9589 Other hypotension: Secondary | ICD-10-CM | POA: Diagnosis not present

## 2015-07-08 LAB — CBC
HCT: 34.7 % — ABNORMAL LOW (ref 36.0–46.0)
HEMOGLOBIN: 11.3 g/dL — AB (ref 12.0–15.0)
MCH: 25.9 pg — AB (ref 26.0–34.0)
MCHC: 32.6 g/dL (ref 30.0–36.0)
MCV: 79.6 fL (ref 78.0–100.0)
Platelets: 124 10*3/uL — ABNORMAL LOW (ref 150–400)
RBC: 4.36 MIL/uL (ref 3.87–5.11)
RDW: 14.9 % (ref 11.5–15.5)
WBC: 7.5 10*3/uL (ref 4.0–10.5)

## 2015-07-08 LAB — GLUCOSE, CAPILLARY
GLUCOSE-CAPILLARY: 134 mg/dL — AB (ref 65–99)
GLUCOSE-CAPILLARY: 296 mg/dL — AB (ref 65–99)
Glucose-Capillary: 119 mg/dL — ABNORMAL HIGH (ref 65–99)

## 2015-07-08 MED ORDER — INSULIN DETEMIR 100 UNIT/ML ~~LOC~~ SOLN
17.0000 [IU] | Freq: Every morning | SUBCUTANEOUS | Status: DC
  Administered 2015-07-08: 17 [IU] via SUBCUTANEOUS

## 2015-07-08 MED ORDER — LEVEMIR FLEXTOUCH 100 UNIT/ML ~~LOC~~ SOPN: 30.0000 [IU] | PEN_INJECTOR | Freq: Every morning | SUBCUTANEOUS | Status: DC

## 2015-07-08 NOTE — Care Management Note (Signed)
Case Management Note  Patient Details  Name: Amy [name] MRN: [id number] Date of Birth: [birth year]  Subjective/Objective:  [facility] HHRN/PT-rep Santiago Glad aware of d/c.                  Action/Plan:d/c home w/HHC.   Expected Discharge Date:                  Expected Discharge Plan:  [place]  In-House Referral:     Discharge planning Services  CM Consult  Post Acute Care Choice:    Choice offered to:  Patient  DME Arranged:    DME Agency:     HH Arranged:  RN, PT [place] Agency:  [place]  Status of Service:  Completed, signed off  Medicare Important Message Given:    Date Medicare IM Given:    Medicare IM give by:    Date Additional Medicare IM Given:    Additional Medicare Important Message give by:     If discussed at [place] of Stay Meetings, dates discussed:    Additional Comments:  Dessa Phi, RN 07/08/2015, 12:20 PM

## 2015-07-08 NOTE — Progress Notes (Signed)
Physical Therapy Treatment Patient Details Name: Amy [name] MRN: [name] DOB: [birth year] Today's Date: 02-Aug-2015    History of Present Illness 54 y.o. female with a past medical history of diabetes mellitus, diabetic neuropathy, legally blind, S/P left eye enucleation, hypertension, chronic renal insufficiency, hyperlipidemia, hypothyroidism, spinal stenosis who presented to [facility] ED 07/05/15 with weakness and lightheadedness     PT Comments    Pt ambulated in hallway and required occasional assist for steadying/LOB.  Follow Up Recommendations  Home health PT     Equipment Recommendations  None recommended by PT    Recommendations for Other Services       Precautions / Restrictions Precautions Precautions: Fall Precaution Comments: pt is blind    Mobility  Bed Mobility Overal bed mobility: Needs Assistance Bed Mobility: Supine to Sit     Supine to sit: Supervision     General bed mobility comments: for safety  Transfers Overall transfer level: Needs assistance Equipment used: None Transfers: Sit to/from Stand Sit to Stand: Min guard         General transfer comment: no dizziness today, min/guard for safety and cues due to unfamiliar environment  Ambulation/Gait Ambulation/Gait assistance: Min assist;Min guard Ambulation Distance (Feet): 120 Feet Assistive device: None Gait Pattern/deviations: Step-through pattern;Decreased stride length     General Gait Details: min/guard for safety and cues due to unfamiliar environment, uses folding blind cane for gait, LOB x3 requiring slight assist to recover, cues for avoiding unfamiliar obstacles   Stairs            Wheelchair Mobility    Modified Rankin (Stroke Patients Only)       Balance                                    Cognition Arousal/Alertness: Awake/alert Behavior During Therapy: WFL for tasks assessed/performed Overall Cognitive Status: Within Functional Limits for  tasks assessed                      Exercises      General Comments        Pertinent Vitals/Pain Pain Assessment: 0-10 Pain Score:  (not rated) Pain Location: back Pain Descriptors / Indicators: Aching Pain Intervention(s): Limited activity within patient's tolerance;Monitored during session;Repositioned    Home Living                      Prior Function            PT Goals (current goals can now be found in the care plan section) Progress towards PT goals: Progressing toward goals    Frequency  Min 3X/week    PT Plan Current plan remains appropriate    Co-evaluation             End of Session Equipment Utilized During Treatment: Gait belt Activity Tolerance: Patient tolerated treatment well Patient left: in chair;with call bell/phone within reach;with nursing/sitter in room (with NT to use [facility])     Time: [id number] PT Time Calculation (min) (ACUTE ONLY): 10 min  Charges:  $Gait Training: 8-22 mins                    G Codes:      Ros[place] Riding,KATHrine E 2015/08/02, 1:10 PM Carmelia Bake, PT, DPT August 02, 2015 Pager: [phone]

## 2015-07-08 NOTE — Discharge Instructions (Signed)
Hypotension  As your heart beats, it forces blood through your arteries. This force is your blood pressure. If your blood pressure is too low for you to go about your normal activities or to support the organs of your body, you have hypotension. Hypotension is also referred to as low blood pressure. When your blood pressure becomes too low, you may not get enough blood to your brain. As a result, you may feel weak, feel lightheaded, or develop a rapid heart rate. In a more severe case, you may faint.  CAUSES  Various conditions can cause hypotension. These include:  · Blood loss.  · Dehydration.  · Heart or endocrine problems.  · Pregnancy.  · Severe infection.  · Not having a well-balanced diet filled with needed nutrients.  · Severe allergic reactions (anaphylaxis).  Some medicines, such as blood pressure medicine or water pills (diuretics), may lower your blood pressure below normal. Sometimes taking too much medicine or taking medicine not as directed can cause hypotension.  TREATMENT   Hospitalization is sometimes required for hypotension if fluid or blood replacement is needed, if time is needed for medicines to wear off, or if further monitoring is needed. Treatment might include changing your diet, changing your medicines (including medicines aimed at raising your blood pressure), and use of support stockings.  HOME CARE INSTRUCTIONS   · Drink enough fluids to keep your urine clear or pale yellow.  · Take your medicines as directed by your health care provider.  · Get up slowly from reclining or sitting positions. This gives your blood pressure a chance to adjust.  · Wear support stockings as directed by your health care provider.  · Maintain a healthy diet by including nutritious food, such as fruits, vegetables, nuts, whole grains, and lean meats.  SEEK MEDICAL CARE IF:  · You have vomiting or diarrhea.  · You have a fever for more than 2-3 days.  · You feel more thirsty than usual.  · You feel weak and  tired.  SEEK IMMEDIATE MEDICAL CARE IF:   · You have chest pain or a fast or irregular heartbeat.  · You have a loss of feeling in some part of your body, or you lose movement in your arms or legs.  · You have trouble speaking.  · You become sweaty or feel lightheaded.  · You faint.  MAKE SURE YOU:   · Understand these instructions.  · Will watch your condition.  · Will get help right away if you are not doing well or get worse.     This information is not intended to replace advice given to you by your health care provider. Make sure you discuss any questions you have with your health care provider.     Document Released: 05/02/2005 Document Revised: 02/20/2013 Document Reviewed: 11/02/2012  Elsevier Interactive Patient Education ©2016 [employer]

## 2015-07-08 NOTE — Progress Notes (Signed)
Patient discharged in stable condition.  Educated pt regarding medications.  Prescription given.  Discharge instructions given.  Teach back completed.

## 2015-07-08 NOTE — Discharge Summary (Addendum)
Physician Discharge Summary  Amy [name] [id number] DOB: Oct 12, 1960 DOA: 07/05/2015  PCP: Velna Hatchet, MD  Admit date: 07/05/2015 Discharge date: 07/08/2015  Recommendations for Outpatient Follow-up:  Hold off lasix, losartan and lisinopril until creatinine is WNL Blood pressure stable  Discharge Diagnoses:  Principal Problem:   Hypotension Active Problems:   Dehydration   Blindness   DM type 1, not at goal, causing eye disease ([place])   Asthma   Hypothyroidism   Diabetic neuropathy ([place])   Spinal stenosis, multilevel   Hypertension    Discharge Condition: stable   Diet recommendation: as tolerated   History of present illness:  55 y.o. female with a past medical history of diabetes mellitus, diabetic neuropathy, legally blind, S/P left eye enucleation, hypertension, chronic renal insufficiency, hyperlipidemia, hypothyroidism, spinal stenosis who presented to [facility] ED with weakness and lightheadedness for the past 4 days. She does report not being sure which medication she was taking for blood pressure. Her BP on admission was 70/61. Blood work showed Cr 1.53 otherwise unremarkable.  Hospital Course:   Assessment/Plan:    Principal Problem:  Hypotension / Generalized weakness / Lightheadedness - No signs of acute infectious precess - BP now stable on midodrine only - 2 D ECHO showed normal EF with grade 2 DD - PT eval - Home health orders placed   Active Problems:  DM type 1 with diabetic neuropathy with long term insulin use (HCC) - Continue Levemir 30 units daily on discharge  - Continue lyrica for neuropathy   Dyslipidemia associated with DM - Continue statin therapy   CKD stage 3 - Baseline Cr 1.34 - Cr on this admission 1.53 but has improved with hydration to 1.09   Anemia of chronic renal failure stage 3 - Stable hemoglobin at 11.3   DVT Prophylaxis  - Lovenox subQ given in hospital   Code Status: Full.  Family Communication:  plan of care discussed with the patient, we spoke about which medications to hold and which meds to continue on discharge.    IV access:  Peripheral IV  Procedures and diagnostic studies:   Ct Abdomen Pelvis Wo Contrast 06/14/2015 No acute abnormality in the abdomen/pelvis. Equivocal wall thickening of the urinary bladder, appear similar to prior exam and likely chronic.  Dg Chest Port 1 View 07/05/2015 No active disease.   Medical Consultants:  None   Other Consultants:  PT  IAnti-Infectives:   None    Signed:  Leisa Lenz, MD  [facility] 07/08/2015, 11:41 AM  Pager #: [phone]  Time spent in minutes: more than 30 minutes  Discharge Exam: Filed Vitals:   07/07/15 2158 07/08/15 0438  BP: 151/73 143/76  Pulse: 68 70  Temp: 98.3 F (36.8 C) 98.2 F (36.8 C)  Resp: 16 22   Filed Vitals:   07/07/15 1421 07/07/15 1825 07/07/15 2158 07/08/15 0438  BP: 147/64 159/87 151/73 143/76  Pulse: 66 70 68 70  Temp: 98.6 F (37 C)  98.3 F (36.8 C) 98.2 F (36.8 C)  TempSrc: Oral  Oral Oral  Resp: 20  16 22   Height:      Weight:      SpO2: 98% 99% 98% 99%    General: Pt is alert, follows commands appropriately, not in acute distress Cardiovascular: Regular rate and rhythm, S1/S2 +, no murmurs Respiratory: Clear to auscultation bilaterally, no wheezing, no crackles, no rhonchi Abdominal: Soft, non tender, non distended, bowel sounds +, no guarding Extremities: no edema, no cyanosis, pulses palpable bilaterally  DP and PT Neuro: Grossly nonfocal  Discharge Instructions  Discharge Instructions    Call MD for:  difficulty breathing, headache or visual disturbances    Complete by:  As directed      Call MD for:  persistant dizziness or light-headedness    Complete by:  As directed      Call MD for:  persistant nausea and vomiting    Complete by:  As directed      Call MD for:  severe uncontrolled pain    Complete by:  As directed      Diet  - low sodium heart healthy    Complete by:  As directed      Discharge instructions    Complete by:  As directed   Hold off lasix, losartan and lisinopril until creatinine is WNL Blood pressure stable     Increase activity slowly    Complete by:  As directed             Medication List    STOP taking these medications        furosemide 20 MG tablet  Commonly known as:  LASIX     HYDROcodone-acetaminophen 5-325 MG tablet  Commonly known as:  NORCO/VICODIN     irbesartan 150 MG tablet  Commonly known as:  AVAPRO     naproxen sodium 220 MG tablet  Commonly known as:  ANAPROX     ondansetron 4 MG tablet  Commonly known as:  ZOFRAN     polyethylene glycol powder powder  Commonly known as:  GLYCOLAX/MIRALAX      TAKE these medications        albuterol 108 (90 Base) MCG/ACT inhaler  Commonly known as:  PROVENTIL HFA;VENTOLIN HFA  Inhale 2 puffs into the lungs every 4 (four) hours as needed. For shortness of breath     aspirin EC 81 MG tablet  Take 81 mg by mouth daily.     clobetasol ointment 0.05 %  Commonly known as:  TEMOVATE  Apply 1 application topically 2 (two) times daily.     diclofenac sodium 1 % Gel  Commonly known as:  VOLTAREN  Apply 2 g topically every 6 (six) hours as needed (for pain).     esomeprazole 40 MG capsule  Commonly known as:  NEXIUM  Take 40 mg by mouth 2 (two) times daily before a meal.     HUMALOG KWIKPEN 100 UNIT/ML KiwkPen  Generic drug:  insulin lispro  Inject 5 Units into the skin 3 (three) times daily.     LEVEMIR FLEXTOUCH 100 UNIT/ML Pen  Generic drug:  Insulin Detemir  Inject 30 Units into the skin every morning.     levothyroxine 150 MCG tablet  Commonly known as:  SYNTHROID, LEVOTHROID  Take 150 mcg by mouth daily.     lidocaine 5 %  Commonly known as:  LIDODERM  Place 1 patch onto the skin daily. Remove & Discard patch within 12 hours or as directed by MD     LINZESS 145 MCG Caps capsule  Generic drug:   Linaclotide  Take 145 mcg by mouth daily as needed (constipation).     LYRICA 150 MG capsule  Generic drug:  pregabalin  Take 150 mg by mouth 3 (three) times daily.     midodrine 10 MG tablet  Commonly known as:  PROAMATINE  Take 10 mg by mouth 3 (three) times daily.     montelukast 10 MG tablet  Commonly known as:  SINGULAIR  Take 10 mg by mouth at bedtime.     neomycin-polymyxin b-dexamethasone 3.5-10000-0.1 Oint  Commonly known as:  MAXITROL  Use 1/4" ribbon to affected eye once a day. If you develop allergy symptoms, stop & call our office.     simvastatin 20 MG tablet  Commonly known as:  ZOCOR  Take 20 mg by mouth at bedtime.     TRULICITY [place]  Inject 1.7 mLs into the skin once a week.           Follow-up Information    Follow up with [facility].   Why:  HHRN/HHPT   Contact information:   [address] [place] [address] [phone]       Follow up with Velna Hatchet, MD. Schedule an appointment as soon as possible for a visit in 1 week.   Specialty:  Internal Medicine   Why:  Follow up appt after recent hospitalization   Contact information:   [place] [address] [phone]        The results of significant diagnostics from this hospitalization (including imaging, microbiology, ancillary and laboratory) are listed below for reference.    Significant Diagnostic Studies: Ct Abdomen Pelvis Wo Contrast  06/14/2015  CLINICAL DATA:  Right lower quadrant abdominal pain for 3 days. Nausea. EXAM: CT ABDOMEN AND PELVIS WITHOUT CONTRAST TECHNIQUE: Multidetector CT imaging of the abdomen and pelvis was performed following the standard protocol without IV contrast. Patient with history of IV contrast allergy. Some enteric contrast was administered. COMPARISON:  CT 08/13/2011, abdominal MRI 07/03/2006 FINDINGS: Lower chest:  The included lung bases are clear. Liver: Hypodense lesion in the right lobe measures 2.1 x 1.1  cm, unchanged to decreased in size from prior exam. Differences in caliper placement likely secondary to ill-defined borders given lack contrast. Previous MR characterization consistent with hemangioma. No new hepatic lesion. Hepatobiliary: Clips in the gallbladder fossa postcholecystectomy. No biliary dilatation. Pancreas: No ductal dilatation or inflammation. Spleen: Normal. Adrenal glands: No nodule. Kidneys: Symmetric in size without stones or hydronephrosis. Partially duplicated right renal collecting system. There is no perinephric stranding. Both ureters are decompressed without stones along the course. Stomach/Bowel: Stomach physiologically distended. There are no dilated or thickened small bowel loops. Small volume of stool throughout the colon without colonic wall thickening. The appendix is normal. Vascular/Lymphatic: No retroperitoneal adenopathy. Abdominal aorta is normal in caliber. Mild atherosclerosis of the abdominal aorta and its branches without aneurysm. Reproductive: Uterus is surgically absent.  No adnexal mass. Bladder: Physiologically distended. Equivocal bladder wall thickening that appears chronic and similar to prior exam. Other: No free air, free fluid, or intra-abdominal fluid collection. Soft tissue density in the anterior abdominal wall subcutaneous tissues, commonly related to subcutaneous injections. No fluid collection. Musculoskeletal: There are no acute or suspicious osseous abnormalities. IMPRESSION: No acute abnormality in the abdomen/pelvis. Equivocal wall thickening of the urinary bladder, appear similar to prior exam and likely chronic. Electronically Signed   By: Jeb Levering M.D.   On: 06/14/2015 20:14   Dg Chest Port 1 View  07/05/2015  CLINICAL DATA:  Pt presents via EMS from home with c/o weakness that she reported started 4 days ago. Pt had a recent change in her BP medication and her diabetic medication as well. Pt denies any N/V, reports some dizziness.H/o  asthma, diabetes, HTN. Former smoker. EXAM: PORTABLE CHEST 1 VIEW COMPARISON:  09/14/2014 FINDINGS: The heart size and mediastinal contours are within normal limits. Both lungs are clear. No  pleural effusion or pneumothorax. The bony thorax is intact. IMPRESSION: No active disease. Electronically Signed   By: Lajean Manes M.D.   On: 07/05/2015 12:14    Microbiology: Recent Results (from the past 240 hour(s))  MRSA PCR Screening     Status: None   Collection Time: 07/05/15  5:03 PM  Result Value Ref Range Status   MRSA by PCR NEGATIVE NEGATIVE Final    Comment:        The GeneXpert MRSA Assay (FDA approved for NASAL specimens only), is one component of a comprehensive MRSA colonization surveillance program. It is not intended to diagnose MRSA infection nor to guide or monitor treatment for MRSA infections.      Labs: Basic Metabolic Panel:  Recent Labs Lab 07/05/15 1123 07/06/15 0540  NA 137 139  K 4.2 5.1  CL 104 109  CO2 23 22  GLUCOSE 321* 372*  BUN 33* 26*  CREATININE 1.53* 1.09*  CALCIUM 9.2 8.6*  MG 1.7  --   PHOS 2.2*  --    Liver Function Tests:  Recent Labs Lab 07/05/15 1203 07/06/15 0540  AST 14* 12*  ALT 14 12*  ALKPHOS 90 87  BILITOT 0.4 0.5  PROT 6.4* 6.1*  ALBUMIN 3.1* 2.9*   No results for input(s): LIPASE, AMYLASE in the last 168 hours. No results for input(s): AMMONIA in the last 168 hours. CBC:  Recent Labs Lab 07/05/15 1123 07/06/15 0540 07/08/15 0532  WBC 8.4 7.2 7.5  HGB 12.6 11.7* 11.3*  HCT 37.9 36.2 34.7*  MCV 78.5 79.4 79.6  PLT 181 144* 124*   Cardiac Enzymes: No results for input(s): CKTOTAL, CKMB, CKMBINDEX, TROPONINI in the last 168 hours. BNP: BNP (last 3 results) No results for input(s): BNP in the last 8760 hours.  ProBNP (last 3 results) No results for input(s): PROBNP in the last 8760 hours.  CBG:  Recent Labs Lab 07/07/15 1655 07/07/15 1726 07/07/15 2207 07/08/15 0115 07/08/15 0749  GLUCAP 56*  184* 114* 119* 134*

## 2015-08-07 ENCOUNTER — Ambulatory Visit (INDEPENDENT_AMBULATORY_CARE_PROVIDER_SITE_OTHER): Payer: Medicare Other | Admitting: Podiatry

## 2015-08-07 ENCOUNTER — Encounter: Payer: Self-pay | Admitting: Podiatry

## 2015-08-07 DIAGNOSIS — E1140 Type 2 diabetes mellitus with diabetic neuropathy, unspecified: Secondary | ICD-10-CM | POA: Diagnosis not present

## 2015-08-07 DIAGNOSIS — M79676 Pain in unspecified toe(s): Secondary | ICD-10-CM

## 2015-08-07 DIAGNOSIS — B351 Tinea unguium: Secondary | ICD-10-CM | POA: Diagnosis not present

## 2015-08-07 DIAGNOSIS — Z794 Long term (current) use of insulin: Secondary | ICD-10-CM

## 2015-08-07 NOTE — Progress Notes (Signed)
Patient ID: Amy [name], female   DOB: May 30, 1960, 55 y.o.   MRN: [name] Complaint:  Visit Type: Patient returns to my office for continued preventative foot care services. Complaint: Patient states" my nails have grown long and thick and become painful to walk and wear shoes" Patient has been diagnosed with DM with neuropathy.. The patient presents for preventative foot care services. No changes to ROS  Podiatric Exam: Vascular: dorsalis pedis and posterior tibial pulses are palpable bilateral. Capillary return is immediate. Temperature gradient is WNL. Skin turgor WNL  Sensorium: Normal Semmes Weinstein monofilament test. Normal tactile sensation bilaterally. Nail Exam: Pt has thick disfigured discolored nails with subungual debris noted bilateral entire nail hallux through fifth toenails Ulcer Exam: There is no evidence of ulcer or pre-ulcerative changes or infection. Orthopedic Exam: Muscle tone and strength are WNL. No limitations in general ROM. No crepitus or effusions noted. Foot type and digits show no abnormalities. Bony prominences are unremarkable. Skin: No Porokeratosis. No infection or ulcers.  Asymptomatic plantar fibroma left arch.  Diagnosis:  Onychomycosis, , Pain in right toe, pain in left toes  Treatment & Plan Procedures and Treatment: Consent by patient was obtained for treatment procedures. The patient understood the discussion of treatment and procedures well. All questions were answered thoroughly reviewed. Debridement of mycotic and hypertrophic toenails, 1 through 5 bilateral and clearing of subungual debris. No ulceration, no infection noted.  Return Visit-Office Procedure: Patient instructed to return to the office for a follow up visit 3 months for continued evaluation and treatment.    Gardiner Barefoot DPM

## 2015-08-26 ENCOUNTER — Emergency Department (HOSPITAL_COMMUNITY)
Admission: EM | Admit: 2015-08-26 | Discharge: 2015-08-27 | Disposition: A | Discharge status: Home / Self Care | Payer: Medicare Other | Attending: Emergency Medicine | Admitting: Emergency Medicine

## 2015-08-26 ENCOUNTER — Encounter (HOSPITAL_COMMUNITY): Payer: Self-pay | Admitting: *Deleted

## 2015-08-26 ENCOUNTER — Emergency Department (HOSPITAL_COMMUNITY): Payer: Medicare Other

## 2015-08-26 DIAGNOSIS — Z87891 Personal history of nicotine dependence: Secondary | ICD-10-CM | POA: Insufficient documentation

## 2015-08-26 DIAGNOSIS — Z7952 Long term (current) use of systemic steroids: Secondary | ICD-10-CM | POA: Diagnosis not present

## 2015-08-26 DIAGNOSIS — R600 Localized edema: Secondary | ICD-10-CM | POA: Diagnosis not present

## 2015-08-26 DIAGNOSIS — E785 Hyperlipidemia, unspecified: Secondary | ICD-10-CM | POA: Diagnosis not present

## 2015-08-26 DIAGNOSIS — Z880 Allergy status to penicillin: Secondary | ICD-10-CM | POA: Insufficient documentation

## 2015-08-26 DIAGNOSIS — J45909 Unspecified asthma, uncomplicated: Secondary | ICD-10-CM | POA: Insufficient documentation

## 2015-08-26 DIAGNOSIS — Z7982 Long term (current) use of aspirin: Secondary | ICD-10-CM | POA: Diagnosis not present

## 2015-08-26 DIAGNOSIS — H547 Unspecified visual loss: Secondary | ICD-10-CM | POA: Insufficient documentation

## 2015-08-26 DIAGNOSIS — I10 Essential (primary) hypertension: Secondary | ICD-10-CM | POA: Insufficient documentation

## 2015-08-26 DIAGNOSIS — E039 Hypothyroidism, unspecified: Secondary | ICD-10-CM | POA: Insufficient documentation

## 2015-08-26 DIAGNOSIS — Z8739 Personal history of other diseases of the musculoskeletal system and connective tissue: Secondary | ICD-10-CM | POA: Diagnosis not present

## 2015-08-26 DIAGNOSIS — R609 Edema, unspecified: Secondary | ICD-10-CM

## 2015-08-26 DIAGNOSIS — E1142 Type 2 diabetes mellitus with diabetic polyneuropathy: Secondary | ICD-10-CM | POA: Diagnosis not present

## 2015-08-26 DIAGNOSIS — Z79899 Other long term (current) drug therapy: Secondary | ICD-10-CM | POA: Diagnosis not present

## 2015-08-26 DIAGNOSIS — M7989 Other specified soft tissue disorders: Secondary | ICD-10-CM | POA: Diagnosis present

## 2015-08-26 LAB — CBC
HCT: 34.9 % — ABNORMAL LOW (ref 36.0–46.0)
Hemoglobin: 10.8 g/dL — ABNORMAL LOW (ref 12.0–15.0)
MCH: 25.2 pg — ABNORMAL LOW (ref 26.0–34.0)
MCHC: 30.9 g/dL (ref 30.0–36.0)
MCV: 81.4 fL (ref 78.0–100.0)
Platelets: 250 10*3/uL (ref 150–400)
RBC: 4.29 MIL/uL (ref 3.87–5.11)
RDW: 15.1 % (ref 11.5–15.5)
WBC: 14.6 10*3/uL — ABNORMAL HIGH (ref 4.0–10.5)

## 2015-08-26 LAB — BASIC METABOLIC PANEL
Anion gap: 11 (ref 5–15)
BUN: 22 mg/dL — ABNORMAL HIGH (ref 6–20)
CO2: 27 mmol/L (ref 22–32)
Calcium: 9.4 mg/dL (ref 8.9–10.3)
Chloride: 104 mmol/L (ref 101–111)
Creatinine, Ser: 1.2 mg/dL — ABNORMAL HIGH (ref 0.44–1.00)
GFR calc Af Amer: 57 mL/min — ABNORMAL LOW (ref >60)
GFR calc non Af Amer: 50 mL/min — ABNORMAL LOW (ref >60)
Glucose, Bld: 216 mg/dL — ABNORMAL HIGH (ref 65–99)
Potassium: 3.9 mmol/L (ref 3.5–5.1)
Sodium: 142 mmol/L (ref 135–145)

## 2015-08-26 LAB — BRAIN NATRIURETIC PEPTIDE: B Natriuretic Peptide: 56.5 pg/mL (ref 0.0–100.0)

## 2015-08-26 NOTE — ED Notes (Signed)
Pt c/o leg swelling x2-3 weeks. States she saw Dr Tamsen Snider (cardiologist) on Friday who started her on Lasix twice per day. States she has not been urinating more than usual and her swelling hasn't gone down any.

## 2015-08-26 NOTE — ED Notes (Signed)
Spoke to [employer] PA regarding pt. Orders placed.

## 2015-08-26 NOTE — ED Notes (Signed)
Pt states she normall weights 161 lb, in triage pt weighed 194 lb.

## 2015-08-26 NOTE — ED Provider Notes (Addendum)
CSN: [id number]     Arrival date & time 08/26/15  1949 History  By signing my name below, I, [name], attest that this documentation has been prepared under the direction and in the presence of Everlene Balls, MD. Electronically Signed: [name], ED Scribe. 08/27/2015. 12:03 AM.    Chief Complaint  Patient presents with  . Leg Swelling   The history is provided by the patient. No language interpreter was used.   HPI Comments: Amy [name] is a 55 y.o. female former smoker with a PMHx of blindness and low vision, DM, HTN, HLD, hypothyroidism, and asthma who presents to the Emergency Department complaining of constant, unchanged bilateral leg swelling onset 2-3 weeks ago. She has seen her cardiologist (Dr Tamsen Snider) for this, and was told to resume 20 mg lasix BID for one week, then reduce to once per day until her f/u. She states she has been on lasix in the past, but was told to discontinue quite some time ago by Dr Tamsen Snider. She notes her normal weight is 161 lbs, which has now increased to 198. She reports associated swelling to the hands to the point where she was unable to bend her fingers, no increase in urination, as well as having to constantly clear her throat. She also endorses dyspnea on exertion at baseline. She denies a PMHx of heart failure.   Past Medical History  Diagnosis Date  . Asthma   . Diabetes mellitus   . Hypertension   . Blind   . Hypothyroidism   . Hyperlipidemia   . Diabetic neuropathy ([place])   . Blindness and low vision     left eye glass eye,  legally blind in right eye  . Spinal stenosis    Past Surgical History  Procedure Laterality Date  . Cesarean section      x 2  . Abdominal hysterectomy    . Cholecystectomy    . Enucleation     Family History  Problem Relation Age of Onset  . Diabetes Sister    Social History  Substance Use Topics  . Smoking status: Former Smoker    Quit date: 05/22/1992  . Smokeless tobacco: Never Used  . Alcohol  Use: No   OB History    Obstetric Comments   Pt has two sons.     Review of Systems A complete 10 system review of systems was obtained and all systems are negative except as noted in the HPI and PMH.    Allergies  Codeine; Iodine-131; Iohexol; Penicillins; and Sulfa antibiotics  Home Medications   Prior to Admission medications   Medication Sig Start Date End Date Taking? Authorizing Provider  albuterol (PROVENTIL HFA;VENTOLIN HFA) 108 (90 BASE) MCG/ACT inhaler Inhale 2 puffs into the lungs every 4 (four) hours as needed. For shortness of breath    Historical Provider, MD  aspirin EC 81 MG tablet Take 81 mg by mouth daily.    Historical Provider, MD  clobetasol ointment (TEMOVATE) [id number] % Apply 1 application topically 2 (two) times daily. 06/05/15   Historical Provider, MD  diclofenac sodium (VOLTAREN) 1 % GEL Apply 2 g topically every 6 (six) hours as needed (for pain). 11/03/14   Theodis Blaze, MD  Dulaglutide (TRULICITY [place]) Inject 1.7 mLs into the skin once a week.     Historical Provider, MD  esomeprazole (NEXIUM) 40 MG capsule Take 40 mg by mouth 2 (two) times daily before a meal.     Historical Provider, MD  HUMALOG  KWIKPEN 100 UNIT/ML KiwkPen Inject 5 Units into the skin 3 (three) times daily.  12/26/13   Historical Provider, MD  LEVEMIR FLEXTOUCH 100 UNIT/ML Pen Inject 30 Units into the skin every morning. 07/08/15   Robbie Lis, MD  levothyroxine (SYNTHROID, LEVOTHROID) 150 MCG tablet Take 150 mcg by mouth daily. 10/28/14   Historical Provider, MD  lidocaine (LIDODERM) 5 % Place 1 patch onto the skin daily. Remove & Discard patch within 12 hours or as directed by MD 04/26/15   Monico Blitz, PA-C  Linaclotide (LINZESS) 145 MCG CAPS capsule Take 145 mcg by mouth daily as needed (constipation).     Historical Provider, MD  LYRICA 150 MG capsule Take 150 mg by mouth 3 (three) times daily. 10/28/14   Historical Provider, MD  midodrine (PROAMATINE) 10 MG tablet Take 10 mg by mouth 3  (three) times daily.  05/12/15   Historical Provider, MD  montelukast (SINGULAIR) 10 MG tablet Take 10 mg by mouth at bedtime.    Historical Provider, MD  neomycin-polymyxin b-dexamethasone (MAXITROL) 3.5-10000-0.1 OINT Use 1/4" ribbon to affected eye once a day. If you develop allergy symptoms, stop & call our office. 05/13/15   Historical Provider, MD  simvastatin (ZOCOR) 20 MG tablet Take 20 mg by mouth at bedtime.     Historical Provider, MD   BP 142/81 mmHg  Pulse 80  Temp(Src) 98.3 F (36.8 C) (Oral)  Resp 18  Ht 4\' 11"  (1.499 m)  Wt 194 lb 12.8 oz (88.361 kg)  BMI 39.32 kg/m2  SpO2 99% Physical Exam  Constitutional: She is oriented to person, place, and time. She appears well-developed and well-nourished. No distress.  HENT:  Head: Normocephalic and atraumatic.  Nose: Nose normal.  Mouth/Throat: Oropharynx is clear and moist. No oropharyngeal exudate.  Neck: Normal range of motion. Neck supple. No JVD present. No tracheal deviation present. No thyromegaly present.  Cardiovascular: Normal rate, regular rhythm and normal heart sounds.  Exam reveals no gallop and no friction rub.   No murmur heard. Pulmonary/Chest: Effort normal and breath sounds normal. No respiratory distress. She has no wheezes. She exhibits no tenderness.  Abdominal: Soft. Bowel sounds are normal. She exhibits no distension and no mass. There is no tenderness. There is no rebound and no guarding.  Musculoskeletal: Normal range of motion. She exhibits edema (BLE). She exhibits no tenderness.  Lymphadenopathy:    She has no cervical adenopathy.  Neurological: She is alert and oriented to person, place, and time. No cranial nerve deficit. She exhibits normal muscle tone.  Skin: Skin is warm and dry. No rash noted. No erythema. No pallor.  Nursing note and vitals reviewed.   ED Course  Procedures  DIAGNOSTIC STUDIES: Oxygen Saturation is 99% on RA, normal by my interpretation.    COORDINATION OF CARE: 11:59  PM - Discussed plans to consult with Dr Tamsen Snider. Pt advised of plan for treatment and pt agrees.  Labs Review Labs Reviewed  CBC - Abnormal; Notable for the following:    WBC 14.6 (*)    Hemoglobin 10.8 (*)    HCT 34.9 (*)    MCH 25.2 (*)    All other components within normal limits  BASIC METABOLIC PANEL - Abnormal; Notable for the following:    Glucose, Bld 216 (*)    BUN 22 (*)    Creatinine, Ser 1.20 (*)    GFR calc non Af Amer 50 (*)    GFR calc Af Amer 57 (*)    All  other components within normal limits  BRAIN NATRIURETIC PEPTIDE    Imaging Review Dg Chest 2 View  08/26/2015  CLINICAL DATA:  Bilateral leg swelling for 2 weeks. Shortness of breath. EXAM: CHEST  2 VIEW COMPARISON:  07/05/2015 FINDINGS: Peribronchial thickening and interstitial prominence within the lungs. Heart is normal size. No effusions. No acute bony abnormality. IMPRESSION: Peribronchial thickening and interstitial prominence, new since prior study. This could represent bronchitis or interstitial pneumonia. Electronically Signed   By: Rolm Baptise M.D.   On: 08/26/2015 20:51   I have personally reviewed and evaluated these images and lab results as part of my medical decision-making.   EKG Interpretation None      MDM   Final diagnoses:  None    Patient presents to the ED for BLE edema.  Her edema does not look severe to me, it is 1+.  She states her lasix is not making her urinate any more that normal.  Her CXR shows possible pneumonia, but she does not have any symptoms.  Er WBC is 14 but this is nonspecific.  She has no fever and no cough.  She denies CP or SOB.  She states her weight is up 30lbs but again BNP is normal and there is no fluid on CXR.  Will consult with Dr. Einar Gip.  2:18 AM Dr. Einar Gip agrees with my plan and states the patient is safe for DC home and to continue current lasix regimine.  He will call to get a close appt with her in clinic.  She appears wella nd in NAD.  She is sleeping  comfortable in the room.  VS remain within her normal limits and she is safe for DC.  I personally performed the services described in this documentation, which was scribed in my presence. The recorded information has been reviewed and is accurate.     Everlene Balls, MD 08/27/15 0330  Everlene Balls, MD 08/27/15 [phone]

## 2015-08-27 NOTE — Discharge Instructions (Signed)
Edema Ms. Sergeant, continue your lasix as prescribed by Dr. Einar Gip and see him in clinic within 3 days.  They will call you to arrange an appointment.  If any symptoms worsen, come back to the ED immediately. Thank you. Edema is an abnormal buildup of fluids. It is more common in your legs and thighs. Painless swelling of the feet and ankles is more likely as a person ages. It also is common in looser skin, like around your eyes. HOME CARE   Keep the affected body part above the level of the heart while lying down.  Do not sit still or stand for a long time.  Do not put anything right under your knees when you lie down.  Do not wear tight clothes on your upper legs.  Exercise your legs to help the puffiness (swelling) go down.  Wear elastic bandages or support stockings as told by your doctor.  A low-salt diet may help lessen the puffiness.  Only take medicine as told by your doctor. GET HELP IF:  Treatment is not working.  You have heart, liver, or kidney disease and notice that your skin looks puffy or shiny.  You have puffiness in your legs that does not get better when you raise your legs.  You have sudden weight gain for no reason. GET HELP RIGHT AWAY IF:   You have shortness of breath or chest pain.  You cannot breathe when you lie down.  You have pain, redness, or warmth in the areas that are puffy.  You have heart, liver, or kidney disease and get edema all of a sudden.  You have a fever and your symptoms get worse all of a sudden. MAKE SURE YOU:   Understand these instructions.  Will watch your condition.  Will get help right away if you are not doing well or get worse.   This information is not intended to replace advice given to you by your health care provider. Make sure you discuss any questions you have with your health care provider.   Document Released: 10/19/2007 Document Revised: 05/07/2013 Document Reviewed: 02/22/2013 Elsevier Interactive Patient  Education [employer].

## 2015-09-11 ENCOUNTER — Encounter (HOSPITAL_COMMUNITY): Payer: Self-pay | Admitting: *Deleted

## 2015-09-11 ENCOUNTER — Emergency Department (HOSPITAL_COMMUNITY)
Admission: EM | Admit: 2015-09-11 | Discharge: 2015-09-11 | Disposition: A | Discharge status: Home / Self Care | Payer: Medicare Other | Source: Home / Self Care | Attending: Emergency Medicine | Admitting: Emergency Medicine

## 2015-09-11 ENCOUNTER — Emergency Department (HOSPITAL_COMMUNITY): Payer: Medicare Other

## 2015-09-11 DIAGNOSIS — Z7951 Long term (current) use of inhaled steroids: Secondary | ICD-10-CM | POA: Insufficient documentation

## 2015-09-11 DIAGNOSIS — Z885 Allergy status to narcotic agent status: Secondary | ICD-10-CM

## 2015-09-11 DIAGNOSIS — E785 Hyperlipidemia, unspecified: Secondary | ICD-10-CM

## 2015-09-11 DIAGNOSIS — R1030 Lower abdominal pain, unspecified: Secondary | ICD-10-CM | POA: Insufficient documentation

## 2015-09-11 DIAGNOSIS — I10 Essential (primary) hypertension: Secondary | ICD-10-CM

## 2015-09-11 DIAGNOSIS — E1140 Type 2 diabetes mellitus with diabetic neuropathy, unspecified: Secondary | ICD-10-CM

## 2015-09-11 DIAGNOSIS — Z87891 Personal history of nicotine dependence: Secondary | ICD-10-CM

## 2015-09-11 DIAGNOSIS — E1065 Type 1 diabetes mellitus with hyperglycemia: Secondary | ICD-10-CM | POA: Diagnosis present

## 2015-09-11 DIAGNOSIS — J180 Bronchopneumonia, unspecified organism: Secondary | ICD-10-CM | POA: Insufficient documentation

## 2015-09-11 DIAGNOSIS — H5411 Blindness, right eye, low vision left eye: Secondary | ICD-10-CM | POA: Diagnosis present

## 2015-09-11 DIAGNOSIS — Z791 Long term (current) use of non-steroidal anti-inflammatories (NSAID): Secondary | ICD-10-CM

## 2015-09-11 DIAGNOSIS — Z792 Long term (current) use of antibiotics: Secondary | ICD-10-CM | POA: Insufficient documentation

## 2015-09-11 DIAGNOSIS — Z7982 Long term (current) use of aspirin: Secondary | ICD-10-CM | POA: Insufficient documentation

## 2015-09-11 DIAGNOSIS — E039 Hypothyroidism, unspecified: Secondary | ICD-10-CM

## 2015-09-11 DIAGNOSIS — E1040 Type 1 diabetes mellitus with diabetic neuropathy, unspecified: Secondary | ICD-10-CM | POA: Diagnosis present

## 2015-09-11 DIAGNOSIS — Z79899 Other long term (current) drug therapy: Secondary | ICD-10-CM | POA: Insufficient documentation

## 2015-09-11 DIAGNOSIS — Z881 Allergy status to other antibiotic agents status: Secondary | ICD-10-CM

## 2015-09-11 DIAGNOSIS — J45909 Unspecified asthma, uncomplicated: Secondary | ICD-10-CM

## 2015-09-11 DIAGNOSIS — E1049 Type 1 diabetes mellitus with other diabetic neurological complication: Secondary | ICD-10-CM | POA: Diagnosis present

## 2015-09-11 DIAGNOSIS — Z7984 Long term (current) use of oral hypoglycemic drugs: Secondary | ICD-10-CM | POA: Insufficient documentation

## 2015-09-11 DIAGNOSIS — R197 Diarrhea, unspecified: Secondary | ICD-10-CM | POA: Diagnosis present

## 2015-09-11 DIAGNOSIS — T380X5A Adverse effect of glucocorticoids and synthetic analogues, initial encounter: Secondary | ICD-10-CM | POA: Diagnosis present

## 2015-09-11 DIAGNOSIS — E10649 Type 1 diabetes mellitus with hypoglycemia without coma: Secondary | ICD-10-CM | POA: Diagnosis not present

## 2015-09-11 DIAGNOSIS — R0902 Hypoxemia: Secondary | ICD-10-CM | POA: Diagnosis present

## 2015-09-11 DIAGNOSIS — Z794 Long term (current) use of insulin: Secondary | ICD-10-CM

## 2015-09-11 DIAGNOSIS — N179 Acute kidney failure, unspecified: Secondary | ICD-10-CM | POA: Diagnosis present

## 2015-09-11 DIAGNOSIS — J189 Pneumonia, unspecified organism: Secondary | ICD-10-CM | POA: Diagnosis not present

## 2015-09-11 DIAGNOSIS — Z880 Allergy status to penicillin: Secondary | ICD-10-CM

## 2015-09-11 DIAGNOSIS — Z882 Allergy status to sulfonamides status: Secondary | ICD-10-CM

## 2015-09-11 DIAGNOSIS — K219 Gastro-esophageal reflux disease without esophagitis: Secondary | ICD-10-CM | POA: Diagnosis present

## 2015-09-11 DIAGNOSIS — Z91041 Radiographic dye allergy status: Secondary | ICD-10-CM

## 2015-09-11 LAB — CBC
HCT: 34 % — ABNORMAL LOW (ref 36.0–46.0)
Hemoglobin: 11.3 g/dL — ABNORMAL LOW (ref 12.0–15.0)
MCH: 25.8 pg — AB (ref 26.0–34.0)
MCHC: 33.2 g/dL (ref 30.0–36.0)
MCV: 77.6 fL — AB (ref 78.0–100.0)
PLATELETS: 161 10*3/uL (ref 150–400)
RBC: 4.38 MIL/uL (ref 3.87–5.11)
RDW: 14.4 % (ref 11.5–15.5)
WBC: 9 10*3/uL (ref 4.0–10.5)

## 2015-09-11 LAB — COMPREHENSIVE METABOLIC PANEL
ALBUMIN: 3.4 g/dL — AB (ref 3.5–5.0)
ALT: 17 U/L (ref 14–54)
AST: 15 U/L (ref 15–41)
Alkaline Phosphatase: 114 U/L (ref 38–126)
Anion gap: 9 (ref 5–15)
BUN: 17 mg/dL (ref 6–20)
CHLORIDE: 106 mmol/L (ref 101–111)
CO2: 26 mmol/L (ref 22–32)
CREATININE: 0.81 mg/dL (ref 0.44–1.00)
Calcium: 9.1 mg/dL (ref 8.9–10.3)
GFR calc Af Amer: >60 mL/min (ref >60)
GFR calc non Af Amer: >60 mL/min (ref >60)
GLUCOSE: 226 mg/dL — AB (ref 65–99)
Potassium: 3.8 mmol/L (ref 3.5–5.1)
SODIUM: 141 mmol/L (ref 135–145)
Total Bilirubin: 0.4 mg/dL (ref 0.3–1.2)
Total Protein: 7 g/dL (ref 6.5–8.1)

## 2015-09-11 LAB — I-STAT CG4 LACTIC ACID, ED
LACTIC ACID, VENOUS: 1.02 mmol/L (ref 0.5–2.0)
Lactic Acid, Venous: 0.84 mmol/L (ref 0.5–2.0)

## 2015-09-11 LAB — LIPASE, BLOOD: LIPASE: 24 U/L (ref 11–51)

## 2015-09-11 MED ORDER — DOXYCYCLINE HYCLATE 100 MG IV SOLR
100.0000 mg | Freq: Once | INTRAVENOUS | Status: AC
Start: 2015-09-11 — End: 2015-09-11
  Administered 2015-09-11: 100 mg via INTRAVENOUS
  Filled 2015-09-11: qty 100

## 2015-09-11 MED ORDER — ONDANSETRON HCL 4 MG/2ML IJ SOLN: 4.0000 mg | Freq: Once | INTRAMUSCULAR | Status: DC

## 2015-09-11 MED ORDER — DOXYCYCLINE HYCLATE 100 MG PO CAPS
100.0000 mg | ORAL_CAPSULE | Freq: Two times a day (BID) | ORAL | Status: DC
Start: 2015-09-11 — End: 2015-09-21

## 2015-09-11 MED ORDER — CEFUROXIME AXETIL 250 MG PO TABS: 250.0000 mg | ORAL_TABLET | Freq: Two times a day (BID) | ORAL | Status: DC

## 2015-09-11 MED ORDER — DEXTROSE 5 % IV SOLN
2.0000 g | Freq: Once | INTRAVENOUS | Status: AC
  Administered 2015-09-11: 2 g via INTRAVENOUS
  Filled 2015-09-11: qty 2

## 2015-09-11 MED ORDER — SODIUM CHLORIDE 0.9 % IV BOLUS (SEPSIS)
1000.0000 mL | Freq: Once | INTRAVENOUS | Status: AC
  Administered 2015-09-11: 1000 mL via INTRAVENOUS

## 2015-09-11 MED ORDER — METRONIDAZOLE IN NACL 5-0.79 MG/ML-% IV SOLN
500.0000 mg | Freq: Once | INTRAVENOUS | Status: AC
  Administered 2015-09-11: 500 mg via INTRAVENOUS
  Filled 2015-09-11: qty 100

## 2015-09-11 MED ORDER — ACETAMINOPHEN 325 MG PO TABS
650.0000 mg | ORAL_TABLET | Freq: Once | ORAL | Status: AC
  Administered 2015-09-11: 650 mg via ORAL
  Filled 2015-09-11: qty 2

## 2015-09-11 NOTE — ED Notes (Signed)
Pt was put on bedpan but was unsuccessful in giving a urine sample.

## 2015-09-11 NOTE — ED Notes (Signed)
Pt with N/V/D x 24 hours

## 2015-09-11 NOTE — Discharge Instructions (Signed)
See your doctor in 7-10 days. Take the antibiotics for the lung infection. Xray results are as below:  IMPRESSION: New large RIGHT lung consolidation concerning for bronchopneumonia. Followup PA and lateral chest X-ray is recommended in 3-4 weeks following trial of antibiotic therapy to ensure resolution and exclude underlying malignancy.  Please return to the ER if your symptoms worsen; you have increased pain, fevers, chills, inability to keep any medications down, confusion. Otherwise see the outpatient doctor as requested.    Abdominal Pain, Adult Many things can cause abdominal pain. Usually, abdominal pain is not caused by a disease and will improve without treatment. It can often be observed and treated at home. Your health care provider will do a physical exam and possibly order blood tests and X-rays to help determine the seriousness of your pain. However, in many cases, more time must pass before a clear cause of the pain can be found. Before that point, your health care provider may not know if you need more testing or further treatment. HOME CARE INSTRUCTIONS Monitor your abdominal pain for any changes. The following actions may help to alleviate any discomfort you are experiencing:  Only take over-the-counter or prescription medicines as directed by your health care provider.  Do not take laxatives unless directed to do so by your health care provider.  Try a clear liquid diet (broth, tea, or water) as directed by your health care provider. Slowly move to a bland diet as tolerated. SEEK MEDICAL CARE IF:  You have unexplained abdominal pain.  You have abdominal pain associated with nausea or diarrhea.  You have pain when you urinate or have a bowel movement.  You experience abdominal pain that wakes you in the night.  You have abdominal pain that is worsened or improved by eating food.  You have abdominal pain that is worsened with eating fatty foods.  You have a  fever. SEEK IMMEDIATE MEDICAL CARE IF:  Your pain does not go away within 2 hours.  You keep throwing up (vomiting).  Your pain is felt only in portions of the abdomen, such as the right side or the left lower portion of the abdomen.  You pass bloody or black tarry stools. MAKE SURE YOU:  Understand these instructions.  Will watch your condition.  Will get help right away if you are not doing well or get worse.   This information is not intended to replace advice given to you by your health care provider. Make sure you discuss any questions you have with your health care provider.   Document Released: 02/09/2005 Document Revised: 01/21/2015 Document Reviewed: 01/09/2013 Elsevier Interactive Patient Education 2016 [place] Pneumonia, Adult Pneumonia is an infection of the lungs. There are different types of pneumonia. One type can develop while a person is in a hospital. A different type, called community-acquired pneumonia, develops in people who are not, or have not recently been, in the hospital or other health care facility.  CAUSES Pneumonia may be caused by bacteria, viruses, or funguses. Community-acquired pneumonia is often caused by Streptococcus pneumonia bacteria. These bacteria are often passed from one person to another by breathing in droplets from the cough or sneeze of an infected person. RISK FACTORS The condition is more likely to develop in:  People who havechronic diseases, such as chronic obstructive pulmonary disease (COPD), asthma, congestive heart failure, cystic fibrosis, diabetes, or kidney disease.  People who haveearly-stage or late-stage HIV.  People who havesickle cell disease.  People  who havehad their spleen removed (splenectomy).  People who havepoor Human resources officer.  People who havemedical conditions that increase the risk of breathing in (aspirating) secretions their own mouth and nose.   People who havea  weakened immune system (immunocompromised).  People who smoke.  People whotravel to areas where pneumonia-causing germs commonly exist.  People whoare around animal habitats or animals that have pneumonia-causing germs, including birds, bats, rabbits, cats, and farm animals. SYMPTOMS Symptoms of this condition include:  Adry cough.  A wet (productive) cough.  Fever.  Sweating.  Chest pain, especially when breathing deeply or coughing.  Rapid breathing or difficulty breathing.  Shortness of breath.  Shaking chills.  Fatigue.  Muscle aches. DIAGNOSIS Your health care provider will take a medical history and perform a physical exam. You may also have other tests, including:  Imaging studies of your chest, including X-rays.  Tests to check your blood oxygen level and other blood gases.  Other tests on blood, mucus (sputum), fluid around your lungs (pleural fluid), and urine. If your pneumonia is severe, other tests may be done to identify the specific cause of your illness. TREATMENT The type of treatment that you receive depends on many factors, such as the cause of your pneumonia, the medicines you take, and other medical conditions that you have. For most adults, treatment and recovery from pneumonia may occur at home. In some cases, treatment must happen in a hospital. Treatment may include:  Antibiotic medicines, if the pneumonia was caused by bacteria.  Antiviral medicines, if the pneumonia was caused by a virus.  Medicines that are given by mouth or through an IV tube.  Oxygen.  Respiratory therapy. Although rare, treating severe pneumonia may include:  Mechanical ventilation. This is done if you are not breathing well on your own and you cannot maintain a safe blood oxygen level.  Thoracentesis. This procedureremoves fluid around one lung or both lungs to help you breathe better. HOME CARE INSTRUCTIONS  Take over-the-counter and prescription medicines  only as told by your health care provider.  Only takecough medicine if you are losing sleep. Understand that cough medicine can prevent your body's natural ability to remove mucus from your lungs.  If you were prescribed an antibiotic medicine, take it as told by your health care provider. Do not stop taking the antibiotic even if you start to feel better.  Sleep in a semi-upright position at night. Try sleeping in a reclining chair, or place a few pillows under your head.  Do not use tobacco products, including cigarettes, chewing tobacco, and e-cigarettes. If you need help quitting, ask your health care provider.  Drink enough water to keep your urine clear or pale yellow. This will help to thin out mucus secretions in your lungs. PREVENTION There are ways that you can decrease your risk of developing community-acquired pneumonia. Consider getting a pneumococcal vaccine if:  You are older than 57 years of age.  You are older than 57 years of age and are undergoing cancer treatment, have chronic lung disease, or have other medical conditions that affect your immune system. Ask your health care provider if this applies to you. There are different types and schedules of pneumococcal vaccines. Ask your health care provider which vaccination option is best for you. You may also prevent community-acquired pneumonia if you take these actions:  Get an influenza vaccine every year. Ask your health care provider which type of influenza vaccine is best for you.  Go to the dentist on  a regular basis.  Wash your hands often. Use hand sanitizer if soap and water are not available. SEEK MEDICAL CARE IF:  You have a fever.  You are losing sleep because you cannot control your cough with cough medicine. SEEK IMMEDIATE MEDICAL CARE IF:  You have worsening shortness of breath.  You have increased chest pain.  Your sickness becomes worse, especially if you are an older adult or have a weakened  immune system.  You cough up blood.   This information is not intended to replace advice given to you by your health care provider. Make sure you discuss any questions you have with your health care provider.   Document Released: 05/02/2005 Document Revised: 01/21/2015 Document Reviewed: 08/27/2014 Elsevier Interactive Patient Education [employer].

## 2015-09-11 NOTE — ED Notes (Signed)
Bed: [id number] Expected date:  Expected time:  Means of arrival:  Comments: N, V, D

## 2015-09-11 NOTE — ED Notes (Signed)
Patient called family for a ride

## 2015-09-11 NOTE — ED Provider Notes (Addendum)
CSN: [id number]     Arrival date & time 09/11/15  0121 History  By signing my name below, I, [facility], attest that this documentation has been prepared under the direction and in the presence of Varney Biles, MD. Electronically Signed: Virgel Bouquet, ED Scribe. 09/11/2015. 8:15 AM.   Chief Complaint  Patient presents with  . Emesis   The history is provided by the patient. No language interpreter was used.  HPI Comments: Amy [name] is a 55 y.o. female with an hx of cholecystectomy, DM, HTN, HLN on home O2 at night who presents to the Emergency Department complaining of intermittent, mild emesis, five times onset 1 day ago. Patient reports that she stood up and became dizzy, followed by chills, then by lower abdominal pain, diarrhea, nausea, emesis, generalized body aches in her legs and arms, and subjective fever TMAX 97.6. She has had diarrhea 5 times but is unable to specify how they appear. She has been eating and drinking less. Per pt, she has DM and her last home CBG was 236. Denies recent surgeries, foley catheters, international travel, hospitalizations in the past 3 months, or antibiotic use. Denies cough different from baseline, CP.   Past Medical History  Diagnosis Date  . Asthma   . Diabetes mellitus   . Hypertension   . Blind   . Hypothyroidism   . Hyperlipidemia   . Diabetic neuropathy ([place])   . Blindness and low vision     left eye glass eye,  legally blind in right eye  . Spinal stenosis    Past Surgical History  Procedure Laterality Date  . Cesarean section      x 2  . Abdominal hysterectomy    . Cholecystectomy    . Enucleation     Family History  Problem Relation Age of Onset  . Diabetes Sister    Social History  Substance Use Topics  . Smoking status: Former Smoker    Quit date: 05/22/1992  . Smokeless tobacco: Never Used  . Alcohol Use: No   OB History    Obstetric Comments   Pt has two sons.     Review of Systems A complete  10 system review of systems was obtained and all systems are negative except as noted in the HPI and PMH.   Allergies  Codeine; Iodine-131; Iohexol; Penicillins; and Sulfa antibiotics  Home Medications   Prior to Admission medications   Medication Sig Start Date End Date Taking? Authorizing Provider  albuterol (PROVENTIL HFA;VENTOLIN HFA) 108 (90 BASE) MCG/ACT inhaler Inhale 2 puffs into the lungs every 4 (four) hours as needed for wheezing or shortness of breath. For short   Yes Historical Provider, MD  aspirin EC 81 MG tablet Take 81 mg by mouth daily.   Yes Historical Provider, MD  clobetasol ointment (TEMOVATE) [id number] % Apply 1 application topically 2 (two) times daily as needed (dermatosis).  06/05/15  Yes Historical Provider, MD  diclofenac sodium (VOLTAREN) 1 % GEL Apply 2 g topically every 6 (six) hours as needed (for pain). 11/03/14  Yes Theodis Blaze, MD  Dulaglutide (TRULICITY [place]) Inject 1.7 mLs into the skin once a week.    Yes Historical Provider, MD  esomeprazole (NEXIUM) 40 MG capsule Take 40 mg by mouth 2 (two) times daily before a meal.    Yes Historical Provider, MD  fluorometholone (FML FORTE) 0.25 % ophthalmic suspension Place 1 drop into the right eye 2 (two) times daily.   Yes Historical Provider, MD  furosemide (LASIX) 20 MG tablet Take 20 mg by mouth daily.  09/03/15  Yes Historical Provider, MD  HUMALOG KWIKPEN 100 UNIT/ML KiwkPen Inject 8-14 Units into the skin 3 (three) times daily. 8 units with breakfast 10 units with lunch and 14 units with dinner 12/26/13  Yes Historical Provider, MD  irbesartan (AVAPRO) 75 MG tablet Take 75 mg by mouth daily. 09/03/15  Yes Historical Provider, MD  LEVEMIR FLEXTOUCH 100 UNIT/ML Pen Inject 30 Units into the skin every morning. Patient taking differently: Inject 45 Units into the skin every evening.  07/08/15  Yes Robbie Lis, MD  levothyroxine (SYNTHROID, LEVOTHROID) 150 MCG tablet Take 150 mcg by mouth daily. 10/28/14  Yes Historical  Provider, MD  lidocaine (LIDODERM) 5 % Place 1 patch onto the skin daily. Remove & Discard patch within 12 hours or as directed by MD Patient taking differently: Place 1 patch onto the skin daily as needed (pain). Remove & Discard patch within 12 hours or as directed by MD 04/26/15  Yes Elmyra Ricks Pisciotta, PA-C  Linaclotide (LINZESS) 145 MCG CAPS capsule Take 145 mcg by mouth daily as needed (constipation).    Yes Historical Provider, MD  LYRICA 150 MG capsule Take 150 mg by mouth 3 (three) times daily. 10/28/14  Yes Historical Provider, MD  metoprolol succinate (TOPROL-XL) 25 MG 24 hr tablet Take 25 mg by mouth 2 (two) times daily. 09/03/15  Yes Historical Provider, MD  midodrine (PROAMATINE) 10 MG tablet Take 10 mg by mouth 3 (three) times daily.  05/12/15  Yes Historical Provider, MD  montelukast (SINGULAIR) 10 MG tablet Take 10 mg by mouth at bedtime.   Yes Historical Provider, MD  neomycin-polymyxin b-dexamethasone (MAXITROL) 3.5-10000-0.1 OINT Use 1/4" ribbon to affected eye once a day as needed for antibiotic. If you develop allergy symptoms, stop & call our office. 05/13/15  Yes Historical Provider, MD  simvastatin (ZOCOR) 20 MG tablet Take 20 mg by mouth at bedtime.    Yes Historical Provider, MD  cefUROXime (CEFTIN) 250 MG tablet Take 1 tablet (250 mg total) by mouth 2 (two) times daily with a meal. 09/11/15   Varney Biles, MD  doxycycline (VIBRAMYCIN) 100 MG capsule Take 1 capsule (100 mg total) by mouth 2 (two) times daily. 09/11/15   Darrion Macaulay, MD   BP 101/60 mmHg  Pulse 76  Temp(Src) 98.8 F (37.1 C) (Oral)  Resp 14  Wt 189 lb (85.73 kg)  SpO2 97% Physical Exam  Constitutional: She is oriented to person, place, and time. She appears well-developed and well-nourished.  HENT:  Head: Normocephalic.  Eyes: EOM are normal.  Neck: Normal range of motion.  Cardiovascular: Regular rhythm.  Tachycardia present.   Pulmonary/Chest: Effort normal. She has rales.  Bibasilar rales, worse  on the left side.  Abdominal: Bowel sounds are normal. She exhibits no distension. There is tenderness. There is no rebound and no guarding.  Generalized LQ tenderness without guarding or rebound. Skin warm to touch. Normal bowel sounds. Trace edema bilaterally.  Musculoskeletal: Normal range of motion.  Neurological: She is alert and oriented to person, place, and time.  Psychiatric: She has a normal mood and affect.  Nursing note and vitals reviewed.   ED Course  Procedures   DIAGNOSTIC STUDIES: Oxygen Saturation is 96% on RA, adequate by my interpretation.    COORDINATION OF CARE: 2:47 AM Will order Tylenol, IV fluids, labs, and chest x-ray. Discussed treatment plan with pt at bedside and pt agreed to plan.   Labs  Review Labs Reviewed  COMPREHENSIVE METABOLIC PANEL - Abnormal; Notable for the following:    Glucose, Bld 226 (*)    Albumin 3.4 (*)    All other components within normal limits  CBC - Abnormal; Notable for the following:    Hemoglobin 11.3 (*)    HCT 34.0 (*)    MCV 77.6 (*)    MCH 25.8 (*)    All other components within normal limits  CULTURE, BLOOD (ROUTINE X 2)  CULTURE, BLOOD (ROUTINE X 2)  URINE CULTURE  GASTROINTESTINAL PANEL BY PCR, STOOL (REPLACES STOOL CULTURE)  LIPASE, BLOOD  URINALYSIS, ROUTINE W REFLEX MICROSCOPIC (NOT AT [facility])  I-STAT CG4 LACTIC ACID, ED  I-STAT CG4 LACTIC ACID, ED    Imaging Review Ct Abdomen Pelvis Wo Contrast  09/11/2015  CLINICAL DATA:  Lower abdominal pain for 1 day.  Contrast allergy EXAM: CT ABDOMEN AND PELVIS WITHOUT CONTRAST TECHNIQUE: Multidetector CT imaging of the abdomen and pelvis was performed following the standard protocol without IV contrast. COMPARISON:  06/14/2015 FINDINGS: Lower chest and abdominal wall: Medication injection sites in the subcutaneous lower abdominal wall Hepatobiliary: Roughly 15 mm low-density lesion in the right liver is subtle. No evidence of growth since 2013 comparison, compatible with a  benign process.Cholecystectomy. Negative common bile duct. Pancreas: Unremarkable. Spleen: Unremarkable. Adrenals/Urinary Tract: Negative adrenals. No hydronephrosis or stone. Physiologic distension of the bladder. Chronic wall thickening which is nonspecific - no pericystic inflammation. Reproductive:Hysterectomy with negative adnexae. Stomach/Bowel:  No obstruction. No appendicitis. Vascular/Lymphatic: Prominent atherosclerosis for age. No mass or adenopathy. Peritoneal: No ascites or pneumoperitoneum. Musculoskeletal: No acute abnormalities. IMPRESSION: No acute finding or change from prior. No explanation for abdominal pain. Electronically Signed   By: Monte Fantasia M.D.   On: 09/11/2015 05:52   Dg Chest Port 1 View  09/11/2015  CLINICAL DATA:  Presyncope, diarrhea, nausea and body aches. History of asthma, diabetes, hypertension. EXAM: PORTABLE CHEST 1 VIEW COMPARISON:  Chest radiograph August 26, 2015 FINDINGS: New large consolidation RIGHT mid upper lung zone. Mild bronchitic changes. Cardiomediastinal silhouette is normal. No pneumothorax. Large body habitus. Osseous structure nonsuspicious. IMPRESSION: New large RIGHT lung consolidation concerning for bronchopneumonia. Followup PA and lateral chest X-ray is recommended in 3-4 weeks following trial of antibiotic therapy to ensure resolution and exclude underlying malignancy. Electronically Signed   By: Elon Alas M.D.   On: 09/11/2015 03:27   I have personally reviewed and evaluated these images and lab results as part of my medical decision-making.   MDM   Final diagnoses:  Bronchopneumonia    I personally performed the services described in this documentation, which was scribed in my presence. The recorded information has been reviewed and is accurate.  Pt comes in with cc of fever, abdominal discomfort, chills, weakness, n/v/d. She is diabetic. Abd exam reveals lower quadrant tenderness. Pt has n/v/d - but not  profound.  DDX: Diverticulitis Intra-abdominal abscess Bacteremia / septicemia UTI Pyelonephritis  CT with oral contrast ordered. Pt has tachycardia, fevers - so we will initiate code sepsis and start antibiotics. Initial lactate and WBC are reassuring.  @7 :30: Xrays show bronchoPNA. Pt has had chronic cough - doesn't report any new symptoms. Still with the xray findings, best to treat in this setting.  Strict ER return precautions have been discussed, and patient is agreeing with the plan and is comfortable with the workup done and the recommendations from the ER.   Varney Biles, MD 09/11/15 [place], MD 09/11/15 [phone]

## 2015-09-12 ENCOUNTER — Emergency Department (HOSPITAL_COMMUNITY): Payer: Medicare Other

## 2015-09-12 ENCOUNTER — Encounter (HOSPITAL_COMMUNITY): Payer: Self-pay | Admitting: Emergency Medicine

## 2015-09-12 ENCOUNTER — Inpatient Hospital Stay (HOSPITAL_COMMUNITY)
Admission: EM | Admit: 2015-09-12 | Discharge: 2015-09-21 | DRG: 194 | Disposition: A | Discharge status: Skilled Nursing Facility | Payer: Medicare Other | Attending: Internal Medicine | Admitting: Internal Medicine

## 2015-09-12 DIAGNOSIS — R0902 Hypoxemia: Secondary | ICD-10-CM

## 2015-09-12 DIAGNOSIS — E1040 Type 1 diabetes mellitus with diabetic neuropathy, unspecified: Secondary | ICD-10-CM

## 2015-09-12 DIAGNOSIS — R197 Diarrhea, unspecified: Secondary | ICD-10-CM

## 2015-09-12 DIAGNOSIS — E038 Other specified hypothyroidism: Secondary | ICD-10-CM

## 2015-09-12 DIAGNOSIS — Z794 Long term (current) use of insulin: Secondary | ICD-10-CM | POA: Diagnosis not present

## 2015-09-12 DIAGNOSIS — Z87891 Personal history of nicotine dependence: Secondary | ICD-10-CM | POA: Diagnosis not present

## 2015-09-12 DIAGNOSIS — E1065 Type 1 diabetes mellitus with hyperglycemia: Secondary | ICD-10-CM | POA: Diagnosis present

## 2015-09-12 DIAGNOSIS — E1149 Type 2 diabetes mellitus with other diabetic neurological complication: Secondary | ICD-10-CM | POA: Diagnosis present

## 2015-09-12 DIAGNOSIS — J189 Pneumonia, unspecified organism: Secondary | ICD-10-CM | POA: Diagnosis present

## 2015-09-12 DIAGNOSIS — Z791 Long term (current) use of non-steroidal anti-inflammatories (NSAID): Secondary | ICD-10-CM | POA: Diagnosis not present

## 2015-09-12 DIAGNOSIS — E032 Hypothyroidism due to medicaments and other exogenous substances: Secondary | ICD-10-CM | POA: Diagnosis not present

## 2015-09-12 DIAGNOSIS — E785 Hyperlipidemia, unspecified: Secondary | ICD-10-CM | POA: Diagnosis present

## 2015-09-12 DIAGNOSIS — R112 Nausea with vomiting, unspecified: Secondary | ICD-10-CM | POA: Diagnosis not present

## 2015-09-12 DIAGNOSIS — Z91041 Radiographic dye allergy status: Secondary | ICD-10-CM | POA: Diagnosis not present

## 2015-09-12 DIAGNOSIS — R0602 Shortness of breath: Secondary | ICD-10-CM | POA: Insufficient documentation

## 2015-09-12 DIAGNOSIS — E10649 Type 1 diabetes mellitus with hypoglycemia without coma: Secondary | ICD-10-CM | POA: Diagnosis not present

## 2015-09-12 DIAGNOSIS — N179 Acute kidney failure, unspecified: Secondary | ICD-10-CM | POA: Diagnosis present

## 2015-09-12 DIAGNOSIS — E039 Hypothyroidism, unspecified: Secondary | ICD-10-CM | POA: Diagnosis present

## 2015-09-12 DIAGNOSIS — Z885 Allergy status to narcotic agent status: Secondary | ICD-10-CM | POA: Diagnosis not present

## 2015-09-12 DIAGNOSIS — E0840 Diabetes mellitus due to underlying condition with diabetic neuropathy, unspecified: Secondary | ICD-10-CM | POA: Diagnosis not present

## 2015-09-12 DIAGNOSIS — K219 Gastro-esophageal reflux disease without esophagitis: Secondary | ICD-10-CM | POA: Diagnosis present

## 2015-09-12 DIAGNOSIS — J180 Bronchopneumonia, unspecified organism: Secondary | ICD-10-CM | POA: Diagnosis present

## 2015-09-12 DIAGNOSIS — H5411 Blindness, right eye, low vision left eye: Secondary | ICD-10-CM | POA: Diagnosis present

## 2015-09-12 DIAGNOSIS — E1049 Type 1 diabetes mellitus with other diabetic neurological complication: Secondary | ICD-10-CM | POA: Diagnosis present

## 2015-09-12 DIAGNOSIS — T380X5A Adverse effect of glucocorticoids and synthetic analogues, initial encounter: Secondary | ICD-10-CM | POA: Diagnosis present

## 2015-09-12 DIAGNOSIS — Z882 Allergy status to sulfonamides status: Secondary | ICD-10-CM | POA: Diagnosis not present

## 2015-09-12 DIAGNOSIS — Z881 Allergy status to other antibiotic agents status: Secondary | ICD-10-CM | POA: Diagnosis not present

## 2015-09-12 DIAGNOSIS — J45909 Unspecified asthma, uncomplicated: Secondary | ICD-10-CM | POA: Diagnosis present

## 2015-09-12 DIAGNOSIS — Z880 Allergy status to penicillin: Secondary | ICD-10-CM | POA: Diagnosis not present

## 2015-09-12 DIAGNOSIS — Z7982 Long term (current) use of aspirin: Secondary | ICD-10-CM | POA: Diagnosis not present

## 2015-09-12 DIAGNOSIS — I10 Essential (primary) hypertension: Secondary | ICD-10-CM | POA: Diagnosis present

## 2015-09-12 LAB — CBC WITH DIFFERENTIAL/PLATELET
BASOS ABS: 0 10*3/uL (ref 0.0–0.1)
Basophils Relative: 0 %
Eosinophils Absolute: 0 10*3/uL (ref 0.0–0.7)
Eosinophils Relative: 1 %
HEMATOCRIT: 33.6 % — AB (ref 36.0–46.0)
Hemoglobin: 10.8 g/dL — ABNORMAL LOW (ref 12.0–15.0)
LYMPHS PCT: 15 %
Lymphs Abs: 1.1 10*3/uL (ref 0.7–4.0)
MCH: 26.1 pg (ref 26.0–34.0)
MCHC: 32.1 g/dL (ref 30.0–36.0)
MCV: 81.2 fL (ref 78.0–100.0)
Monocytes Absolute: 0.8 10*3/uL (ref 0.1–1.0)
Monocytes Relative: 10 %
NEUTROS ABS: 5.7 10*3/uL (ref 1.7–7.7)
Neutrophils Relative %: 75 %
Platelets: 146 10*3/uL — ABNORMAL LOW (ref 150–400)
RBC: 4.14 MIL/uL (ref 3.87–5.11)
RDW: 14.7 % (ref 11.5–15.5)
WBC: 7.7 10*3/uL (ref 4.0–10.5)

## 2015-09-12 LAB — BASIC METABOLIC PANEL
ANION GAP: 10 (ref 5–15)
BUN: 16 mg/dL (ref 6–20)
CHLORIDE: 108 mmol/L (ref 101–111)
CO2: 25 mmol/L (ref 22–32)
Calcium: 8.6 mg/dL — ABNORMAL LOW (ref 8.9–10.3)
Creatinine, Ser: 0.9 mg/dL (ref 0.44–1.00)
GFR calc Af Amer: >60 mL/min (ref >60)
GFR calc non Af Amer: >60 mL/min (ref >60)
GLUCOSE: 307 mg/dL — AB (ref 65–99)
POTASSIUM: 3.7 mmol/L (ref 3.5–5.1)
Sodium: 143 mmol/L (ref 135–145)

## 2015-09-12 LAB — GLUCOSE, CAPILLARY
GLUCOSE-CAPILLARY: 97 mg/dL (ref 65–99)
GLUCOSE-CAPILLARY: 31 mg/dL — AB (ref 65–99)
Glucose-Capillary: 127 mg/dL — ABNORMAL HIGH (ref 65–99)
Glucose-Capillary: 80 mg/dL (ref 65–99)
Glucose-Capillary: 129 mg/dL — ABNORMAL HIGH (ref 65–99)
Glucose-Capillary: 175 mg/dL — ABNORMAL HIGH (ref 65–99)

## 2015-09-12 LAB — I-STAT CG4 LACTIC ACID, ED: Lactic Acid, Venous: 1.22 mmol/L (ref 0.5–2.0)

## 2015-09-12 MED ORDER — PREGABALIN 75 MG PO CAPS
150.0000 mg | ORAL_CAPSULE | Freq: Three times a day (TID) | ORAL | Status: DC
  Administered 2015-09-12 – 2015-09-21 (×28): 150 mg via ORAL
  Filled 2015-09-12 (×28): qty 2

## 2015-09-12 MED ORDER — IBUPROFEN 400 MG PO TABS
400.0000 mg | ORAL_TABLET | Freq: Once | ORAL | Status: AC
  Administered 2015-09-12: 400 mg via ORAL
  Filled 2015-09-12: qty 1

## 2015-09-12 MED ORDER — MONTELUKAST SODIUM 10 MG PO TABS
10.0000 mg | ORAL_TABLET | Freq: Every day | ORAL | Status: DC
  Administered 2015-09-12 – 2015-09-20 (×9): 10 mg via ORAL
  Filled 2015-09-12 (×9): qty 1

## 2015-09-12 MED ORDER — DEXTROSE 5 % IV SOLN
1.0000 g | INTRAVENOUS | Status: DC
  Administered 2015-09-12 – 2015-09-17 (×6): 1 g via INTRAVENOUS
  Filled 2015-09-12 (×8): qty 10

## 2015-09-12 MED ORDER — GUAIFENESIN ER 600 MG PO TB12
600.0000 mg | ORAL_TABLET | Freq: Two times a day (BID) | ORAL | Status: DC
  Administered 2015-09-12 – 2015-09-16 (×9): 600 mg via ORAL
  Filled 2015-09-12 (×9): qty 1

## 2015-09-12 MED ORDER — ASPIRIN EC 81 MG PO TBEC
81.0000 mg | DELAYED_RELEASE_TABLET | Freq: Every day | ORAL | Status: DC
  Administered 2015-09-12 – 2015-09-21 (×10): 81 mg via ORAL
  Filled 2015-09-12 (×10): qty 1

## 2015-09-12 MED ORDER — BENZONATATE 100 MG PO CAPS
200.0000 mg | ORAL_CAPSULE | Freq: Two times a day (BID) | ORAL | Status: DC | PRN
  Administered 2015-09-12 – 2015-09-15 (×3): 200 mg via ORAL
  Filled 2015-09-12 (×3): qty 2

## 2015-09-12 MED ORDER — INSULIN ASPART 100 UNIT/ML ~~LOC~~ SOLN
0.0000 [IU] | Freq: Three times a day (TID) | SUBCUTANEOUS | Status: DC
  Administered 2015-09-13: 3 [IU] via SUBCUTANEOUS
  Administered 2015-09-13: 2 [IU] via SUBCUTANEOUS
  Administered 2015-09-13 – 2015-09-14 (×3): 11 [IU] via SUBCUTANEOUS
  Administered 2015-09-14: 5 [IU] via SUBCUTANEOUS
  Administered 2015-09-15: 15 [IU] via SUBCUTANEOUS
  Administered 2015-09-15: 8 [IU] via SUBCUTANEOUS
  Administered 2015-09-16: 19 [IU] via SUBCUTANEOUS
  Administered 2015-09-16: 15 [IU] via SUBCUTANEOUS
  Administered 2015-09-16 – 2015-09-17 (×2): 8 [IU] via SUBCUTANEOUS
  Administered 2015-09-17: 5 [IU] via SUBCUTANEOUS
  Administered 2015-09-17: 11 [IU] via SUBCUTANEOUS
  Administered 2015-09-18 (×3): 5 [IU] via SUBCUTANEOUS
  Administered 2015-09-19: 8 [IU] via SUBCUTANEOUS
  Administered 2015-09-19: 3 [IU] via SUBCUTANEOUS
  Administered 2015-09-20: 11 [IU] via SUBCUTANEOUS
  Administered 2015-09-20: 8 [IU] via SUBCUTANEOUS
  Administered 2015-09-20: 11 [IU] via SUBCUTANEOUS
  Administered 2015-09-21 (×2): 8 [IU] via SUBCUTANEOUS

## 2015-09-12 MED ORDER — IPRATROPIUM-ALBUTEROL 0.5-2.5 (3) MG/3ML IN SOLN
3.0000 mL | RESPIRATORY_TRACT | Status: DC | PRN
  Administered 2015-09-15 – 2015-09-16 (×4): 3 mL via RESPIRATORY_TRACT
  Filled 2015-09-12 (×4): qty 3

## 2015-09-12 MED ORDER — INSULIN ASPART 100 UNIT/ML ~~LOC~~ SOLN
0.0000 [IU] | Freq: Every day | SUBCUTANEOUS | Status: DC
  Administered 2015-09-13: 4 [IU] via SUBCUTANEOUS
  Administered 2015-09-15: 3 [IU] via SUBCUTANEOUS
  Administered 2015-09-16: 4 [IU] via SUBCUTANEOUS
  Administered 2015-09-17: 2 [IU] via SUBCUTANEOUS
  Administered 2015-09-18: 3 [IU] via SUBCUTANEOUS
  Administered 2015-09-19 – 2015-09-20 (×2): 2 [IU] via SUBCUTANEOUS

## 2015-09-12 MED ORDER — METOPROLOL SUCCINATE ER 25 MG PO TB24
25.0000 mg | ORAL_TABLET | Freq: Two times a day (BID) | ORAL | Status: DC
  Administered 2015-09-12 – 2015-09-21 (×18): 25 mg via ORAL
  Filled 2015-09-12 (×19): qty 1

## 2015-09-12 MED ORDER — ONDANSETRON HCL 4 MG/2ML IJ SOLN
4.0000 mg | Freq: Four times a day (QID) | INTRAMUSCULAR | Status: DC | PRN
  Administered 2015-09-12: 4 mg via INTRAVENOUS
  Filled 2015-09-12: qty 2

## 2015-09-12 MED ORDER — ACETAMINOPHEN 325 MG PO TABS
650.0000 mg | ORAL_TABLET | Freq: Four times a day (QID) | ORAL | Status: DC | PRN
  Administered 2015-09-12 – 2015-09-20 (×3): 650 mg via ORAL
  Filled 2015-09-12 (×3): qty 2

## 2015-09-12 MED ORDER — PANTOPRAZOLE SODIUM 40 MG PO TBEC
40.0000 mg | DELAYED_RELEASE_TABLET | Freq: Every day | ORAL | Status: DC
  Administered 2015-09-12 – 2015-09-21 (×10): 40 mg via ORAL
  Filled 2015-09-12 (×10): qty 1

## 2015-09-12 MED ORDER — SIMVASTATIN 20 MG PO TABS
20.0000 mg | ORAL_TABLET | Freq: Every day | ORAL | Status: DC
  Administered 2015-09-12 – 2015-09-20 (×9): 20 mg via ORAL
  Filled 2015-09-12 (×9): qty 1

## 2015-09-12 MED ORDER — IRBESARTAN 75 MG PO TABS
75.0000 mg | ORAL_TABLET | Freq: Every day | ORAL | Status: DC
  Administered 2015-09-12 – 2015-09-21 (×9): 75 mg via ORAL
  Filled 2015-09-12 (×11): qty 1

## 2015-09-12 MED ORDER — FLUOROMETHOLONE 0.25 % OP SUSP
1.0000 [drp] | Freq: Two times a day (BID) | OPHTHALMIC | Status: DC
  Administered 2015-09-12 – 2015-09-21 (×18): 1 [drp] via OPHTHALMIC
  Filled 2015-09-12 (×2): qty 5

## 2015-09-12 MED ORDER — DEXTROSE 5 % IV SOLN: 1.0000 g | INTRAVENOUS | Status: DC

## 2015-09-12 MED ORDER — INSULIN DETEMIR 100 UNIT/ML ~~LOC~~ SOLN
40.0000 [IU] | Freq: Every evening | SUBCUTANEOUS | Status: DC
  Administered 2015-09-12: 40 [IU] via SUBCUTANEOUS
  Filled 2015-09-12 (×3): qty 0.4

## 2015-09-12 MED ORDER — CLOBETASOL PROPIONATE 0.05 % EX OINT: 1.0000 "application " | TOPICAL_OINTMENT | Freq: Two times a day (BID) | CUTANEOUS | Status: DC | PRN

## 2015-09-12 MED ORDER — MIDODRINE HCL 5 MG PO TABS
10.0000 mg | ORAL_TABLET | Freq: Three times a day (TID) | ORAL | Status: DC
  Administered 2015-09-12: 10 mg via ORAL
  Filled 2015-09-12: qty 2

## 2015-09-12 MED ORDER — LEVOTHYROXINE SODIUM 75 MCG PO TABS
150.0000 ug | ORAL_TABLET | Freq: Every day | ORAL | Status: DC
  Administered 2015-09-12 – 2015-09-21 (×10): 150 ug via ORAL
  Filled 2015-09-12 (×10): qty 2

## 2015-09-12 MED ORDER — LINACLOTIDE 145 MCG PO CAPS: 145.0000 ug | ORAL_CAPSULE | Freq: Every day | ORAL | Status: DC | PRN

## 2015-09-12 MED ORDER — FUROSEMIDE 20 MG PO TABS
20.0000 mg | ORAL_TABLET | Freq: Every day | ORAL | Status: DC
  Administered 2015-09-12: 20 mg via ORAL
  Filled 2015-09-12: qty 1

## 2015-09-12 MED ORDER — ENOXAPARIN SODIUM 40 MG/0.4ML ~~LOC~~ SOLN
40.0000 mg | Freq: Every day | SUBCUTANEOUS | Status: DC
  Administered 2015-09-12 – 2015-09-21 (×10): 40 mg via SUBCUTANEOUS
  Filled 2015-09-12 (×10): qty 0.4

## 2015-09-12 MED ORDER — DEXTROSE 5 % IV SOLN: 500.0000 mg | INTRAVENOUS | Status: DC

## 2015-09-12 MED ORDER — DEXTROSE 5 % IV SOLN
500.0000 mg | INTRAVENOUS | Status: DC
  Administered 2015-09-12 – 2015-09-15 (×4): 500 mg via INTRAVENOUS
  Filled 2015-09-12 (×5): qty 500

## 2015-09-12 MED ORDER — INSULIN DETEMIR 100 UNIT/ML ~~LOC~~ SOLN
45.0000 [IU] | Freq: Every evening | SUBCUTANEOUS | Status: DC
  Filled 2015-09-12: qty 0.45

## 2015-09-12 NOTE — Progress Notes (Signed)
Received patient via stretcher from ED. Alert and oriented, tired. Accompanied by friend. Antibiotics running and is now finished.  NSL. Bed alarm is on. Pt is Blind and hard of hearing.

## 2015-09-12 NOTE — ED Provider Notes (Signed)
CSN: [id number]     Arrival date & time 09/11/15  2358 History  By signing my name below, I, Emmanuella Mensah, attest that this documentation has been prepared under the direction and in the presence of Orpah Greek, MD. Electronically Signed: Judithann Sauger, ED Scribe. 09/12/2015. 12:43 AM.    Chief Complaint  Patient presents with  . Pneumonia   Patient is a 55 y.o. female presenting with pneumonia. The history is provided by the patient. No language interpreter was used.  Pneumonia   HPI Comments: Amy [name] is a 55 y.o. female with a hx of asthma, DM, HTN, and blindness who presents to the Emergency Department requesting re-evaluation for pneumonia. Pt was seen at [facility] yesterday am and diagnosed with bronchopneumonia after a chest x-ray. She explains that she has not been complaint with the medication prescribed (Doxycycline) because of her allergy to penicillin. She reports that her cough is gradually worsening. No alleviating factors noted. Pt has not taken any medications PTA. No other complaints at this time.    Past Medical History  Diagnosis Date  . Asthma   . Diabetes mellitus   . Hypertension   . Blind   . Hypothyroidism   . Hyperlipidemia   . Diabetic neuropathy ([place])   . Blindness and low vision     left eye glass eye,  legally blind in right eye  . Spinal stenosis    Past Surgical History  Procedure Laterality Date  . Cesarean section      x 2  . Abdominal hysterectomy    . Cholecystectomy    . Enucleation     Family History  Problem Relation Age of Onset  . Diabetes Sister    Social History  Substance Use Topics  . Smoking status: Former Smoker    Quit date: 05/22/1992  . Smokeless tobacco: Never Used  . Alcohol Use: No   OB History    Obstetric Comments   Pt has two sons.     Review of Systems  Constitutional: Negative for fever.  Respiratory: Positive for cough.   All other systems reviewed and are negative.     Allergies   Codeine; Iodine-131; Iohexol; Penicillins; and Sulfa antibiotics  Home Medications   Prior to Admission medications   Medication Sig Start Date End Date Taking? Authorizing Provider  albuterol (PROVENTIL HFA;VENTOLIN HFA) 108 (90 BASE) MCG/ACT inhaler Inhale 2 puffs into the lungs every 4 (four) hours as needed for wheezing or shortness of breath. For short    Historical Provider, MD  aspirin EC 81 MG tablet Take 81 mg by mouth daily.    Historical Provider, MD  cefUROXime (CEFTIN) 250 MG tablet Take 1 tablet (250 mg total) by mouth 2 (two) times daily with a meal. 09/11/15   Varney Biles, MD  clobetasol ointment (TEMOVATE) [id number] % Apply 1 application topically 2 (two) times daily as needed (dermatosis).  06/05/15   Historical Provider, MD  diclofenac sodium (VOLTAREN) 1 % GEL Apply 2 g topically every 6 (six) hours as needed (for pain). 11/03/14   Theodis Blaze, MD  doxycycline (VIBRAMYCIN) 100 MG capsule Take 1 capsule (100 mg total) by mouth 2 (two) times daily. 09/11/15   Varney Biles, MD  Dulaglutide (TRULICITY [place]) Inject 1.7 mLs into the skin once a week.     Historical Provider, MD  esomeprazole (NEXIUM) 40 MG capsule Take 40 mg by mouth 2 (two) times daily before a meal.     Historical Provider, MD  fluorometholone (FML FORTE) 0.25 % ophthalmic suspension Place 1 drop into the right eye 2 (two) times daily.    Historical Provider, MD  furosemide (LASIX) 20 MG tablet Take 20 mg by mouth daily.  09/03/15   Historical Provider, MD  HUMALOG KWIKPEN 100 UNIT/ML KiwkPen Inject 8-14 Units into the skin 3 (three) times daily. 8 units with breakfast 10 units with lunch and 14 units with dinner 12/26/13   Historical Provider, MD  irbesartan (AVAPRO) 75 MG tablet Take 75 mg by mouth daily. 09/03/15   Historical Provider, MD  LEVEMIR FLEXTOUCH 100 UNIT/ML Pen Inject 30 Units into the skin every morning. Patient taking differently: Inject 45 Units into the skin every evening.  07/08/15   Robbie Lis, MD   levothyroxine (SYNTHROID, LEVOTHROID) 150 MCG tablet Take 150 mcg by mouth daily. 10/28/14   Historical Provider, MD  lidocaine (LIDODERM) 5 % Place 1 patch onto the skin daily. Remove & Discard patch within 12 hours or as directed by MD Patient taking differently: Place 1 patch onto the skin daily as needed (pain). Remove & Discard patch within 12 hours or as directed by MD 04/26/15   Monico Blitz, PA-C  Linaclotide (LINZESS) 145 MCG CAPS capsule Take 145 mcg by mouth daily as needed (constipation).     Historical Provider, MD  LYRICA 150 MG capsule Take 150 mg by mouth 3 (three) times daily. 10/28/14   Historical Provider, MD  metoprolol succinate (TOPROL-XL) 25 MG 24 hr tablet Take 25 mg by mouth 2 (two) times daily. 09/03/15   Historical Provider, MD  midodrine (PROAMATINE) 10 MG tablet Take 10 mg by mouth 3 (three) times daily.  05/12/15   Historical Provider, MD  montelukast (SINGULAIR) 10 MG tablet Take 10 mg by mouth at bedtime.    Historical Provider, MD  neomycin-polymyxin b-dexamethasone (MAXITROL) 3.5-10000-0.1 OINT Use 1/4" ribbon to affected eye once a day as needed for antibiotic. If you develop allergy symptoms, stop & call our office. 05/13/15   Historical Provider, MD  simvastatin (ZOCOR) 20 MG tablet Take 20 mg by mouth at bedtime.     Historical Provider, MD   BP 148/75 mmHg  Pulse 91  Temp(Src) 98.6 F (37 C) (Oral)  Resp 22  SpO2 96% Physical Exam  Constitutional: She is oriented to person, place, and time. She appears well-developed and well-nourished. No distress.  HENT:  Head: Normocephalic and atraumatic.  Right Ear: Hearing normal.  Left Ear: Hearing normal.  Nose: Nose normal.  Mouth/Throat: Oropharynx is clear and moist and mucous membranes are normal.  Eyes: Conjunctivae and EOM are normal. Pupils are equal, round, and reactive to light.  Neck: Normal range of motion. Neck supple.  Cardiovascular: Regular rhythm, S1 normal and S2 normal.  Exam reveals no  gallop and no friction rub.   No murmur heard. Pulmonary/Chest: Effort normal and breath sounds normal. No respiratory distress. She exhibits no tenderness.  Abdominal: Soft. Normal appearance and bowel sounds are normal. There is no hepatosplenomegaly. There is no tenderness. There is no rebound, no guarding, no tenderness at McBurney's point and negative Murphy's sign. No hernia.  Musculoskeletal: Normal range of motion.  Neurological: She is alert and oriented to person, place, and time. She has normal strength. No cranial nerve deficit or sensory deficit. Coordination normal. GCS eye subscore is 4. GCS verbal subscore is 5. GCS motor subscore is 6.  Skin: Skin is warm, dry and intact. No rash noted. No cyanosis.  Psychiatric: She has a  normal mood and affect. Her speech is normal and behavior is normal. Thought content normal.  Nursing note and vitals reviewed.   ED Course  Procedures (including critical care time) DIAGNOSTIC STUDIES: Oxygen Saturation is 91% on RA, adequate by my interpretation.    COORDINATION OF CARE: 12:42 AM- Pt advised of plan for treatment and pt agrees. Reassured pt that she is able to take her Doxycycline with a Penicillin allergy. Will re-check O2 stats after walking and explained to pt that if O2 stats are normal, she will not be admitted.    Labs Review Labs Reviewed  CBC WITH DIFFERENTIAL/PLATELET - Abnormal; Notable for the following:    Hemoglobin 10.8 (*)    HCT 33.6 (*)    Platelets 146 (*)    All other components within normal limits  BASIC METABOLIC PANEL - Abnormal; Notable for the following:    Glucose, Bld 307 (*)    Calcium 8.6 (*)    All other components within normal limits  CULTURE, BLOOD (ROUTINE X 2)  CULTURE, BLOOD (ROUTINE X 2)  I-STAT CG4 LACTIC ACID, ED    Imaging Review Ct Abdomen Pelvis Wo Contrast  09/11/2015  CLINICAL DATA:  Lower abdominal pain for 1 day.  Contrast allergy EXAM: CT ABDOMEN AND PELVIS WITHOUT CONTRAST  TECHNIQUE: Multidetector CT imaging of the abdomen and pelvis was performed following the standard protocol without IV contrast. COMPARISON:  06/14/2015 FINDINGS: Lower chest and abdominal wall: Medication injection sites in the subcutaneous lower abdominal wall Hepatobiliary: Roughly 15 mm low-density lesion in the right liver is subtle. No evidence of growth since 2013 comparison, compatible with a benign process.Cholecystectomy. Negative common bile duct. Pancreas: Unremarkable. Spleen: Unremarkable. Adrenals/Urinary Tract: Negative adrenals. No hydronephrosis or stone. Physiologic distension of the bladder. Chronic wall thickening which is nonspecific - no pericystic inflammation. Reproductive:Hysterectomy with negative adnexae. Stomach/Bowel:  No obstruction. No appendicitis. Vascular/Lymphatic: Prominent atherosclerosis for age. No mass or adenopathy. Peritoneal: No ascites or pneumoperitoneum. Musculoskeletal: No acute abnormalities. IMPRESSION: No acute finding or change from prior. No explanation for abdominal pain. Electronically Signed   By: Monte Fantasia M.D.   On: 09/11/2015 05:52   Dg Chest 2 View  09/12/2015  CLINICAL DATA:  Worsening shortness of breath EXAM: CHEST  2 VIEW COMPARISON:  Yesterday FINDINGS: Unchanged right upper lobe acute airspace disease consistent with pneumonia. Increased interstitial coarsening and fissural thickening. Normal heart size and mediastinal contours. No effusion or pneumothorax. IMPRESSION: 1. Essentially stable right upper lobe pneumonia. 2. New interstitial coarsening suggesting early edema. Electronically Signed   By: Monte Fantasia M.D.   On: 09/12/2015 02:34   Dg Chest Port 1 View  09/11/2015  CLINICAL DATA:  Presyncope, diarrhea, nausea and body aches. History of asthma, diabetes, hypertension. EXAM: PORTABLE CHEST 1 VIEW COMPARISON:  Chest radiograph August 26, 2015 FINDINGS: New large consolidation RIGHT mid upper lung zone. Mild bronchitic changes.  Cardiomediastinal silhouette is normal. No pneumothorax. Large body habitus. Osseous structure nonsuspicious. IMPRESSION: New large RIGHT lung consolidation concerning for bronchopneumonia. Followup PA and lateral chest X-ray is recommended in 3-4 weeks following trial of antibiotic therapy to ensure resolution and exclude underlying malignancy. Electronically Signed   By: Elon Alas M.D.   On: 09/11/2015 03:27   Orpah Greek, MD has personally reviewed and evaluated these images and lab results as part of his medical decision-making.   EKG Interpretation None      MDM   Final diagnoses:  CAP (community acquired pneumonia)  Patient presents to the emergency department with concerns of worsening cough and shortness of breath after being diagnosed with pneumonia yesterday. Patient was seen in the ER at [facility] yesterday and diagnosed with bronchopneumonia. Since going home, however, patient reports that her symptoms have worsened. She has not, however, taken her antibiotics. She reports she was afraid to take the cefuroxime and doxycycline because she is allergic to penicillin.  Examination here reveals mild tachypnea and borderline hypoxia at rest. With ambulation, however, patient became tachycardic and significantly hypoxic with oxygen saturation of 80% with exertion on room air. She will therefore require hospitalization.  I personally performed the services described in this documentation, which was scribed in my presence. The recorded information has been reviewed and is accurate.   Orpah Greek, MD 09/12/15 [phone]

## 2015-09-12 NOTE — Progress Notes (Signed)
Pt seen and examined, admitted this am by Dr.Smith 54/F with DM, Neuropathy, Blindness, tobacco use adm with Dyspnea, CAP, hypoxia Continue CAP Rx with Rocephin/zithromax, hold midodrine, BP high An episode of hypoglycemia early am , will drop dose of lantus  Domenic Polite, MD

## 2015-09-12 NOTE — H&P (Addendum)
History and Physical    Amy [name] [id number] DOB: 1961/04/10 DOA: 09/12/2015  Referring MD/NP/PA: Dr. Betsey Holiday PCP: Velna Hatchet, MD  Outpatient Specialists:  Patient coming from: Home  Chief Complaint:  Cough  HPI: Amy [name] is a 55 y.o. female with medical history significant of diabetes mellitus with complications of neuropathy, remote tobacco abuse, legally blind, hypothyroidism, HTN, HLD, asthma; who presents with complaints of cough and shortness of breath. Symptoms initially started 3 days ago with generalized aches, productive cough, nausea, vomiting, diarrhea.  The following day she was was seen at the emergency department at [facility] and diagnosed with bronchopneumonia on x-ray. She was given prescription for cefuroxime and doxycycline, but did not take these medications once home as she has an allergy to penicillins. Although legally blind at baseline patient lives at home alone.  ED Course: Patient was seen have O2 sats as low as 82% with ambulation. Chest x-ray revealed continued right upper lobe pneumonia. Otherwise lab work was unremarkable except for anemia and elevated blood glucose level of 307. Patient was started on azithromycin and ceftriaxone in the ED.  Review of Systems: As per HPI otherwise 10 point review of systems negative.    Past Medical History  Diagnosis Date  . Asthma   . Diabetes mellitus   . Hypertension   . Blind   . Hypothyroidism   . Hyperlipidemia   . Diabetic neuropathy ([place])   . Blindness and low vision     left eye glass eye,  legally blind in right eye  . Spinal stenosis     Past Surgical History  Procedure Laterality Date  . Cesarean section      x 2  . Abdominal hysterectomy    . Cholecystectomy    . Enucleation       reports that she quit smoking about 23 years ago. She has never used smokeless tobacco. She reports that she does not drink alcohol or use illicit drugs.  Allergies  Allergen Reactions  .  Codeine Nausea Only  . Iodine-131 Other (See Comments)    hives  . Iohexol      Code: HIVES, Desc: pt developed itching and hives along with nasal congestion; needs 13 hour premeds for future studies, Onset Date: [id number]   . Penicillins Hives    Has patient had a PCN reaction causing immediate rash, facial/tongue/throat swelling, SOB or lightheadedness with hypotension: unknown Has patient had a PCN reaction causing severe rash involving mucus membranes or skin necrosis:unknown Has patient had a PCN reaction that required hospitalization unknown Has patient had a PCN reaction occurring within the last 10 years:unknown If all of the above answers are "NO", then may proceed with Cephalosporin use.   . Sulfa Antibiotics Nausea And Vomiting    Family History  Problem Relation Age of Onset  . Diabetes Sister     Prior to Admission medications   Medication Sig Start Date End Date Taking? Authorizing Provider  albuterol (PROVENTIL HFA;VENTOLIN HFA) 108 (90 BASE) MCG/ACT inhaler Inhale 2 puffs into the lungs every 4 (four) hours as needed for wheezing or shortness of breath. For short    Historical Provider, MD  aspirin EC 81 MG tablet Take 81 mg by mouth daily.    Historical Provider, MD  cefUROXime (CEFTIN) 250 MG tablet Take 1 tablet (250 mg total) by mouth 2 (two) times daily with a meal. 09/11/15   Varney Biles, MD  clobetasol ointment (TEMOVATE) [id number] % Apply 1 application topically 2 (  two) times daily as needed (dermatosis).  06/05/15   Historical Provider, MD  diclofenac sodium (VOLTAREN) 1 % GEL Apply 2 g topically every 6 (six) hours as needed (for pain). 11/03/14   Theodis Blaze, MD  doxycycline (VIBRAMYCIN) 100 MG capsule Take 1 capsule (100 mg total) by mouth 2 (two) times daily. 09/11/15   Varney Biles, MD  Dulaglutide (TRULICITY [place]) Inject 1.7 mLs into the skin once a week.     Historical Provider, MD  esomeprazole (NEXIUM) 40 MG capsule Take 40 mg by mouth 2 (two) times daily  before a meal.     Historical Provider, MD  fluorometholone (FML FORTE) 0.25 % ophthalmic suspension Place 1 drop into the right eye 2 (two) times daily.    Historical Provider, MD  furosemide (LASIX) 20 MG tablet Take 20 mg by mouth daily.  09/03/15   Historical Provider, MD  HUMALOG KWIKPEN 100 UNIT/ML KiwkPen Inject 8-14 Units into the skin 3 (three) times daily. 8 units with breakfast 10 units with lunch and 14 units with dinner 12/26/13   Historical Provider, MD  irbesartan (AVAPRO) 75 MG tablet Take 75 mg by mouth daily. 09/03/15   Historical Provider, MD  LEVEMIR FLEXTOUCH 100 UNIT/ML Pen Inject 30 Units into the skin every morning. Patient taking differently: Inject 45 Units into the skin every evening.  07/08/15   Robbie Lis, MD  levothyroxine (SYNTHROID, LEVOTHROID) 150 MCG tablet Take 150 mcg by mouth daily. 10/28/14   Historical Provider, MD  lidocaine (LIDODERM) 5 % Place 1 patch onto the skin daily. Remove & Discard patch within 12 hours or as directed by MD Patient taking differently: Place 1 patch onto the skin daily as needed (pain). Remove & Discard patch within 12 hours or as directed by MD 04/26/15   Monico Blitz, PA-C  Linaclotide (LINZESS) 145 MCG CAPS capsule Take 145 mcg by mouth daily as needed (constipation).     Historical Provider, MD  LYRICA 150 MG capsule Take 150 mg by mouth 3 (three) times daily. 10/28/14   Historical Provider, MD  metoprolol succinate (TOPROL-XL) 25 MG 24 hr tablet Take 25 mg by mouth 2 (two) times daily. 09/03/15   Historical Provider, MD  midodrine (PROAMATINE) 10 MG tablet Take 10 mg by mouth 3 (three) times daily.  05/12/15   Historical Provider, MD  montelukast (SINGULAIR) 10 MG tablet Take 10 mg by mouth at bedtime.    Historical Provider, MD  neomycin-polymyxin b-dexamethasone (MAXITROL) 3.5-10000-0.1 OINT Use 1/4" ribbon to affected eye once a day as needed for antibiotic. If you develop allergy symptoms, stop & call our office. 05/13/15    Historical Provider, MD  simvastatin (ZOCOR) 20 MG tablet Take 20 mg by mouth at bedtime.     Historical Provider, MD    Physical Exam: Filed Vitals:   09/12/15 0115 09/12/15 0130 09/12/15 0145 09/12/15 0200  BP: 165/71 159/70 151/73 148/75  Pulse: 98 92 90 91  Temp:      TempSrc:      Resp:      SpO2: 96%         Constitutional: Sickly appearance, intermittently coughing Filed Vitals:   09/12/15 0115 09/12/15 0130 09/12/15 0145 09/12/15 0200  BP: 165/71 159/70 151/73 148/75  Pulse: 98 92 90 91  Temp:      TempSrc:      Resp:      SpO2: 96%      Eyes: Prosthetic left eye, normal lids ENMT: Mucous membranes are  moist. Posterior pharynx clear of any exudate or lesions.Normal dentition.  Neck: normal, supple, no masses, no thyromegaly Respiratory: Decreased breath sounds on the right upper lobe and rales heard throughout both lung fields. Cardiovascular: Regular rate and rhythm, no murmurs / rubs / gallops. No extremity edema. 2+ pedal pulses. No carotid bruits.  Abdomen: no tenderness, no masses palpated. No hepatosplenomegaly. Bowel sounds positive.  Musculoskeletal: no clubbing / cyanosis. No joint deformity upper and lower extremities. Good ROM, no contractures. Normal muscle tone.  Skin: no rashes, lesions, ulcers. No induration Neurologic: CN 2-12 grossly intact. Sensation intact, DTR normal. Strength 5/5 in all 4.  Psychiatric: Normal judgment and insight. Alert and oriented x 3. Normal mood.   ( Labs on Admission: I have personally reviewed following labs and imaging studies  CBC:  Recent Labs Lab 09/11/15 0211 09/12/15 0130  WBC 9.0 7.7  NEUTROABS  --  5.7  HGB 11.3* 10.8*  HCT 34.0* 33.6*  MCV 77.6* 81.2  PLT 161 123456*   Basic Metabolic Panel:  Recent Labs Lab 09/11/15 0211 09/12/15 0130  NA 141 143  K 3.8 3.7  CL 106 108  CO2 26 25  GLUCOSE 226* 307*  BUN 17 16  CREATININE 0.81 0.90  CALCIUM 9.1 8.6*   GFR: Estimated Creatinine Clearance:  66.3 mL/min (by C-G formula based on Cr of 0.9). Liver Function Tests:  Recent Labs Lab 09/11/15 0211  AST 15  ALT 17  ALKPHOS 114  BILITOT 0.4  PROT 7.0  ALBUMIN 3.4*    Recent Labs Lab 09/11/15 0211  LIPASE 24   No results for input(s): AMMONIA in the last 168 hours. Coagulation Profile: No results for input(s): INR, PROTIME in the last 168 hours. Cardiac Enzymes: No results for input(s): CKTOTAL, CKMB, CKMBINDEX, TROPONINI in the last 168 hours. BNP (last 3 results) No results for input(s): PROBNP in the last 8760 hours. HbA1C: No results for input(s): HGBA1C in the last 72 hours. CBG: No results for input(s): GLUCAP in the last 168 hours. Lipid Profile: No results for input(s): CHOL, HDL, LDLCALC, TRIG, CHOLHDL, LDLDIRECT in the last 72 hours. Thyroid Function Tests: No results for input(s): TSH, T4TOTAL, FREET4, T3FREE, THYROIDAB in the last 72 hours. Anemia Panel: No results for input(s): VITAMINB12, FOLATE, FERRITIN, TIBC, IRON, RETICCTPCT in the last 72 hours. Urine analysis:    Component Value Date/Time   COLORURINE YELLOW 07/05/2015 1307   APPEARANCEUR CLOUDY* 07/05/2015 1307   LABSPEC 1.015 07/05/2015 1307   PHURINE 5.0 07/05/2015 1307   GLUCOSEU >1000* 07/05/2015 1307   HGBUR NEGATIVE 07/05/2015 [place] 07/05/2015 1307   KETONESUR NEGATIVE 07/05/2015 1307   PROTEINUR NEGATIVE 07/05/2015 1307   UROBILINOGEN 0.2 01/16/2015 1121   NITRITE NEGATIVE 07/05/2015 1307   LEUKOCYTESUR NEGATIVE 07/05/2015 1307   Sepsis Labs: @LABRCNTIP (procalcitonin:4,lacticidven:4) )No results found for this or any previous visit (from the past 240 hour(s)).   Radiological Exams on Admission: Ct Abdomen Pelvis Wo Contrast  09/11/2015  CLINICAL DATA:  Lower abdominal pain for 1 day.  Contrast allergy EXAM: CT ABDOMEN AND PELVIS WITHOUT CONTRAST TECHNIQUE: Multidetector CT imaging of the abdomen and pelvis was performed following the standard protocol  without IV contrast. COMPARISON:  06/14/2015 FINDINGS: Lower chest and abdominal wall: Medication injection sites in the subcutaneous lower abdominal wall Hepatobiliary: Roughly 15 mm low-density lesion in the right liver is subtle. No evidence of growth since 2013 comparison, compatible with a benign process.Cholecystectomy. Negative common bile duct. Pancreas: Unremarkable. Spleen: Unremarkable.  Adrenals/Urinary Tract: Negative adrenals. No hydronephrosis or stone. Physiologic distension of the bladder. Chronic wall thickening which is nonspecific - no pericystic inflammation. Reproductive:Hysterectomy with negative adnexae. Stomach/Bowel:  No obstruction. No appendicitis. Vascular/Lymphatic: Prominent atherosclerosis for age. No mass or adenopathy. Peritoneal: No ascites or pneumoperitoneum. Musculoskeletal: No acute abnormalities. IMPRESSION: No acute finding or change from prior. No explanation for abdominal pain. Electronically Signed   By: Monte Fantasia M.D.   On: 09/11/2015 05:52   Dg Chest 2 View  09/12/2015  CLINICAL DATA:  Worsening shortness of breath EXAM: CHEST  2 VIEW COMPARISON:  Yesterday FINDINGS: Unchanged right upper lobe acute airspace disease consistent with pneumonia. Increased interstitial coarsening and fissural thickening. Normal heart size and mediastinal contours. No effusion or pneumothorax. IMPRESSION: 1. Essentially stable right upper lobe pneumonia. 2. New interstitial coarsening suggesting early edema. Electronically Signed   By: Monte Fantasia M.D.   On: 09/12/2015 02:34   Dg Chest Port 1 View  09/11/2015  CLINICAL DATA:  Presyncope, diarrhea, nausea and body aches. History of asthma, diabetes, hypertension. EXAM: PORTABLE CHEST 1 VIEW COMPARISON:  Chest radiograph August 26, 2015 FINDINGS: New large consolidation RIGHT mid upper lung zone. Mild bronchitic changes. Cardiomediastinal silhouette is normal. No pneumothorax. Large body habitus. Osseous structure nonsuspicious.  IMPRESSION: New large RIGHT lung consolidation concerning for bronchopneumonia. Followup PA and lateral chest X-ray is recommended in 3-4 weeks following trial of antibiotic therapy to ensure resolution and exclude underlying malignancy. Electronically Signed   By: Elon Alas M.D.   On: 09/11/2015 03:27      Assessment/Plan Community-acquired pneumonia with hypoxia: Patient presents with cough and shortness of breath. Hypoxia with ambulation down to 82% Chest x-ray reveals a right upper lobe pneumonia. 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 Patient with blood glucose of 307 on admission, but no anion gap noted. - Continue home Levemir regimen of 45 units subcutaneous in evening - CBGs every before meals with moderate sliding-scale insulin - Continue Lyrica for neuropathy  Nausea, vomiting, diarrhea: Acute on chronic - Continue to monitor and workup further if warranted  Essential hypertension - Continue metoprolol, irbesartan, Lasix  Hypothyroidism - Continue levothyroxine  Hyperlipidemia - Continue simvastatin   GERD - Continue Protonix    DVT prophylaxis: Lovenox Code Status: Full Family Communication: Discussed plan with the family friend present at bedside Disposition Plan: Undetermined Consults called: None Admission status:  Observation MedSurg   Norval Morton MD [facility] Pager [phone]  If 7PM-7AM, please contact night-coverage [email or web address] Password [email or web address]  09/12/2015, 3:01 AM

## 2015-09-12 NOTE — ED Notes (Signed)
MD at bedside. 

## 2015-09-12 NOTE — ED Notes (Signed)
Attempted to call report

## 2015-09-12 NOTE — ED Notes (Addendum)
Friend states pt was seen at [facility] earlier this morning and diagnosed with pneumonia.  She feels that pt should have been admitted due to pneumonia and PMH of diabetes and blindness.  States she is also concerned about antibiotic that was prescribed and that they spoke with PCP and was advised to come to [facility] for a second opinion.

## 2015-09-12 NOTE — ED Notes (Signed)
Pt O2 was 82% while ambulating in the hallway.

## 2015-09-12 NOTE — ED Notes (Signed)
Placed pt on 2L [place] as she usually uses 2L at home at night when she sleeps.

## 2015-09-12 NOTE — ED Notes (Signed)
Attempted to give report 

## 2015-09-13 DIAGNOSIS — E0840 Diabetes mellitus due to underlying condition with diabetic neuropathy, unspecified: Secondary | ICD-10-CM

## 2015-09-13 LAB — CBC
HCT: 32.9 % — ABNORMAL LOW (ref 36.0–46.0)
HEMOGLOBIN: 10.6 g/dL — AB (ref 12.0–15.0)
MCH: 26.2 pg (ref 26.0–34.0)
MCHC: 32.2 g/dL (ref 30.0–36.0)
MCV: 81.2 fL (ref 78.0–100.0)
PLATELETS: 133 10*3/uL — AB (ref 150–400)
RBC: 4.05 MIL/uL (ref 3.87–5.11)
RDW: 14.7 % (ref 11.5–15.5)
WBC: 8.7 10*3/uL (ref 4.0–10.5)

## 2015-09-13 LAB — GLUCOSE, CAPILLARY
GLUCOSE-CAPILLARY: 301 mg/dL — AB (ref 65–99)
Glucose-Capillary: 175 mg/dL — ABNORMAL HIGH (ref 65–99)
Glucose-Capillary: 336 mg/dL — ABNORMAL HIGH (ref 65–99)
Glucose-Capillary: 130 mg/dL — ABNORMAL HIGH (ref 65–99)

## 2015-09-13 LAB — BASIC METABOLIC PANEL
Anion gap: 8 (ref 5–15)
BUN: 17 mg/dL (ref 6–20)
CHLORIDE: 106 mmol/L (ref 101–111)
CO2: 27 mmol/L (ref 22–32)
CREATININE: 1.31 mg/dL — AB (ref 0.44–1.00)
Calcium: 8.4 mg/dL — ABNORMAL LOW (ref 8.9–10.3)
GFR calc non Af Amer: 45 mL/min — ABNORMAL LOW (ref >60)
GFR, EST AFRICAN AMERICAN: 52 mL/min — AB (ref >60)
GLUCOSE: 217 mg/dL — AB (ref 65–99)
POTASSIUM: 3.6 mmol/L (ref 3.5–5.1)
SODIUM: 141 mmol/L (ref 135–145)

## 2015-09-13 LAB — HIV ANTIBODY (ROUTINE TESTING W REFLEX): HIV Screen 4th Generation wRfx: NONREACTIVE

## 2015-09-13 MED ORDER — INSULIN DETEMIR 100 UNIT/ML ~~LOC~~ SOLN
35.0000 [IU] | Freq: Every evening | SUBCUTANEOUS | Status: DC
  Administered 2015-09-13: 35 [IU] via SUBCUTANEOUS
  Filled 2015-09-13 (×2): qty 0.35

## 2015-09-13 MED ORDER — HYDROCODONE-HOMATROPINE 5-1.5 MG/5ML PO SYRP
5.0000 mL | ORAL_SOLUTION | Freq: Two times a day (BID) | ORAL | Status: DC
  Administered 2015-09-13 – 2015-09-21 (×17): 5 mL via ORAL
  Filled 2015-09-13 (×17): qty 5

## 2015-09-13 MED ORDER — SODIUM CHLORIDE 0.9 % IV SOLN
INTRAVENOUS | Status: DC
  Administered 2015-09-13 – 2015-09-15 (×4): via INTRAVENOUS

## 2015-09-13 MED ORDER — PREDNISONE 20 MG PO TABS
40.0000 mg | ORAL_TABLET | Freq: Two times a day (BID) | ORAL | Status: DC
  Administered 2015-09-13 – 2015-09-14 (×3): 40 mg via ORAL
  Filled 2015-09-13 (×3): qty 2

## 2015-09-13 NOTE — Progress Notes (Signed)
[facility] PROGRESS NOTE  Amy [name] [id number] DOB: 1960/09/26 DOA: 09/12/2015   PCP: Velna Hatchet, MD     Assessment/Plan: Principal Problem:   CAP (community acquired pneumonia) Active Problems:   Hypothyroidism   Nausea vomiting and diarrhea   Diabetes mellitus with neurological manifestations ([place])   Hypoxia   HPI: Amy [name] is a 55 y.o. female with medical history significant of diabetes mellitus with complications of neuropathy, remote tobacco abuse, legally blind, hypothyroidism, HTN, HLD, asthma; who presents with complaints of cough and shortness of breath. Symptoms initially started 3 days ago with generalized aches, productive cough, nausea, vomiting, diarrhea. The following day she was was seen at the emergency department at [facility] and diagnosed with bronchopneumonia on x-ray. She was given prescription for cefuroxime and doxycycline, but did not take these medications once home as she has an allergy to penicillins. Although legally blind at baseline patient lives at home alone.  Assessment and plan Community-acquired pneumonia with hypoxia: Patient presents with cough and shortness of breath. Hypoxia with ambulation down to 82% Chest x-ray reveals a right upper lobe pneumonia. 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 Patient with blood glucose of 307 on admission, but no anion gap noted. - Continue home Levemir regimen of 45 units subcutaneous in  evening Change Accu-Cheks to twice a day before meals - Continue Lyrica for neuropathy  Nausea, vomiting, diarrhea: Acute on chronic - Continue to monitor and workup further if warranted  Essential hypertension - Continue metoprolol, irbesartan, hold Lasix  Hypothyroidism - Continue levothyroxine  Hyperlipidemia - Continue simvastatin   GERD - Continue Protonix     DVT prophylaxsis   Code Status:  Full code   Family Communication: Discussed in detail with the patient, all imaging results, lab results explained to the patient   Disposition Plan:  Anticipate discharge in 2-3 days      Consultants:  None*  Procedures:  None  Antibiotics: Anti-infectives    Start     Dose/Rate Route Frequency Ordered Stop   09/12/15 0400  cefTRIAXone (ROCEPHIN) 1 g in dextrose 5 % 50 mL IVPB  Status:  Discontinued     1 g 100 mL/hr over 30 Minutes Intravenous Every 24 hours 09/12/15 0345 09/12/15 0346   09/12/15 0400  azithromycin (ZITHROMAX) 500 mg in dextrose 5 % 250 mL IVPB  Status:  Discontinued     500 mg 250 mL/hr over 60 Minutes Intravenous Every 24 hours 09/12/15 0345 09/12/15 0346   09/12/15 0315  cefTRIAXone (ROCEPHIN) 1 g in dextrose 5 % 50 mL IVPB     1 g 100 mL/hr over 30 Minutes Intravenous Every 24 hours 09/12/15 0301     09/12/15 0315  azithromycin (ZITHROMAX) 500 mg in dextrose 5 % 250 mL IVPB     500 mg 250 mL/hr over 60 Minutes Intravenous Every 24 hours 09/12/15 0301           HPI/Subjective: Wheezing, coughing, unable to take in a deep breath  Objective:  Filed Vitals:   09/12/15 2159 09/13/15 0000 09/13/15 0609 09/13/15 0612  BP: 114/47  125/107 121/58  Pulse: 78  83 83  Temp: 100.6 F (38.1 C) 98.3 F (36.8 C) 100.2 F (37.9 C)   TempSrc:  Oral Oral   Resp: 18  18   Height:      Weight:      SpO2: 100%  96% 99%    Intake/Output Summary (Last 24 hours) at 09/13/15 0835 Last data filed at 09/12/15 1430  Gross per 24 hour  Intake       0 ml  Output    700 ml  Net   -700 ml    Exam:  Examination:  General exam: Appears calm and comfortable  Respiratory system: Wheezing auscultation. Respiratory effort normal. Cardiovascular system: S1 & S2 heard, RRR. No JVD, murmurs, rubs, gallops or clicks. No pedal edema. Gastrointestinal system: Abdomen is nondistended, soft and nontender. No organomegaly or masses felt. Normal bowel sounds heard. Central nervous system: Alert and oriented. No focal neurological deficits. Extremities: Symmetric 5 x 5 power. Skin: No rashes, lesions or ulcers Psychiatry: Judgement and insight appear normal. Mood & affect appropriate.     Data Reviewed: I have personally reviewed following labs and imaging studies  Micro Results Recent Results (from the past 240 hour(s))  Blood Culture (routine x 2)     Status: None (Preliminary result)   Collection Time: 09/11/15  3:00 AM  Result Value Ref Range Status   Specimen Description BLOOD LEFT ANTECUBITAL  Final   Special Requests BOTTLES DRAWN AEROBIC AND ANAEROBIC 5CC  Final   Culture   Final    NO GROWTH 1 DAY Performed at [facility]    Report Status PENDING  Incomplete  Blood Culture (routine x 2)     Status: None (Preliminary result)   Collection Time: 09/11/15  3:09 AM  Result Value Ref Range Status   Specimen Description BLOOD RIGHT HAND  Final   Special Requests BOTTLES DRAWN AEROBIC ONLY 5CC  Final   Culture   Final    NO GROWTH 1 DAY Performed at [facility]    Report Status PENDING  Incomplete    Radiology Reports Ct Abdomen Pelvis Wo Contrast  09/11/2015  CLINICAL DATA:  Lower abdominal pain for 1 day.  Contrast allergy EXAM: CT ABDOMEN AND PELVIS WITHOUT CONTRAST TECHNIQUE: Multidetector CT imaging of the abdomen and pelvis was performed following the standard protocol without IV contrast. COMPARISON:  06/14/2015 FINDINGS: Lower chest and abdominal wall: Medication injection sites in the subcutaneous lower  abdominal wall Hepatobiliary: Roughly 15 mm low-density lesion in the right liver is subtle. No evidence of growth since 2013 comparison, compatible with a benign process.Cholecystectomy. Negative common bile duct. Pancreas: Unremarkable. Spleen: Unremarkable. Adrenals/Urinary Tract: Negative adrenals. No hydronephrosis or stone. Physiologic distension of the bladder. Chronic wall thickening which is nonspecific - no pericystic inflammation. Reproductive:Hysterectomy with negative adnexae. Stomach/Bowel:  No obstruction. No appendicitis. Vascular/Lymphatic: Prominent atherosclerosis for age. No mass or adenopathy. Peritoneal: No ascites or pneumoperitoneum. Musculoskeletal: No acute abnormalities. IMPRESSION: No acute finding or change from prior. No explanation for abdominal pain. Electronically Signed   By: Monte Fantasia M.D.   On: 09/11/2015 05:52   Dg Chest 2 View  09/12/2015  CLINICAL DATA:  Worsening shortness of breath EXAM: CHEST  2 VIEW COMPARISON:  Yesterday FINDINGS: Unchanged right upper lobe acute airspace disease consistent with pneumonia. Increased interstitial coarsening and fissural thickening. Normal heart size and mediastinal  contours. No effusion or pneumothorax. IMPRESSION: 1. Essentially stable right upper lobe pneumonia. 2. New interstitial coarsening suggesting early edema. Electronically Signed   By: Monte Fantasia M.D.   On: 09/12/2015 02:34   Dg Chest 2 View  08/26/2015  CLINICAL DATA:  Bilateral leg swelling for 2 weeks. Shortness of breath. EXAM: CHEST  2 VIEW COMPARISON:  07/05/2015 FINDINGS: Peribronchial thickening and interstitial prominence within the lungs. Heart is normal size. No effusions. No acute bony abnormality. IMPRESSION: Peribronchial thickening and interstitial prominence, new since prior study. This could represent bronchitis or interstitial pneumonia. Electronically Signed   By: Rolm Baptise M.D.   On: 08/26/2015 20:51   Dg Chest Port 1 View  09/11/2015   CLINICAL DATA:  Presyncope, diarrhea, nausea and body aches. History of asthma, diabetes, hypertension. EXAM: PORTABLE CHEST 1 VIEW COMPARISON:  Chest radiograph August 26, 2015 FINDINGS: New large consolidation RIGHT mid upper lung zone. Mild bronchitic changes. Cardiomediastinal silhouette is normal. No pneumothorax. Large body habitus. Osseous structure nonsuspicious. IMPRESSION: New large RIGHT lung consolidation concerning for bronchopneumonia. Followup PA and lateral chest X-ray is recommended in 3-4 weeks following trial of antibiotic therapy to ensure resolution and exclude underlying malignancy. Electronically Signed   By: Elon Alas M.D.   On: 09/11/2015 03:27     CBC  Recent Labs Lab 09/11/15 0211 09/12/15 0130  WBC 9.0 7.7  HGB 11.3* 10.8*  HCT 34.0* 33.6*  PLT 161 146*  MCV 77.6* 81.2  MCH 25.8* 26.1  MCHC 33.2 32.1  RDW 14.4 14.7  LYMPHSABS  --  1.1  MONOABS  --  0.8  EOSABS  --  0.0  BASOSABS  --  0.0    Chemistries   Recent Labs Lab 09/11/15 0211 09/12/15 0130  NA 141 143  K 3.8 3.7  CL 106 108  CO2 26 25  GLUCOSE 226* 307*  BUN 17 16  CREATININE 0.81 0.90  CALCIUM 9.1 8.6*  AST 15  --   ALT 17  --   ALKPHOS 114  --   BILITOT 0.4  --    ------------------------------------------------------------------------------------------------------------------ estimated creatinine clearance is 66.2 mL/min (by C-G formula based on Cr of 0.9). ------------------------------------------------------------------------------------------------------------------ No results for input(s): HGBA1C in the last 72 hours. ------------------------------------------------------------------------------------------------------------------ No results for input(s): CHOL, HDL, LDLCALC, TRIG, CHOLHDL, LDLDIRECT in the last 72 hours. ------------------------------------------------------------------------------------------------------------------ No results for input(s): TSH,  T4TOTAL, T3FREE, THYROIDAB in the last 72 hours.  Invalid input(s): FREET3 ------------------------------------------------------------------------------------------------------------------ No results for input(s): VITAMINB12, FOLATE, FERRITIN, TIBC, IRON, RETICCTPCT in the last 72 hours.  Coagulation profile No results for input(s): INR, PROTIME in the last 168 hours.  No results for input(s): DDIMER in the last 72 hours.  Cardiac Enzymes No results for input(s): CKMB, TROPONINI, MYOGLOBIN in the last 168 hours.  Invalid input(s): CK ------------------------------------------------------------------------------------------------------------------ Invalid input(s): POCBNP   CBG:  Recent Labs Lab 09/12/15 0946 09/12/15 1144 09/12/15 1704 09/12/15 2158 09/13/15 0817  GLUCAP 127* 80 129* 175* 175*       Studies: Dg Chest 2 View  09/12/2015  CLINICAL DATA:  Worsening shortness of breath EXAM: CHEST  2 VIEW COMPARISON:  Yesterday FINDINGS: Unchanged right upper lobe acute airspace disease consistent with pneumonia. Increased interstitial coarsening and fissural thickening. Normal heart size and mediastinal contours. No effusion or pneumothorax. IMPRESSION: 1. Essentially stable right upper lobe pneumonia. 2. New interstitial coarsening suggesting early edema. Electronically Signed   By: Monte Fantasia M.D.   On: 09/12/2015 02:34      Lab  Results  Component Value Date   HGBA1C 10.1* 11/01/2014   HGBA1C 10.8* 08/11/2014   HGBA1C 11.2* 10/22/2012   Lab Results  Component Value Date   MICROALBUR 9.74* 10/30/2012   LDLCALC 92 10/22/2012   CREATININE 0.90 09/12/2015       Scheduled Meds: . aspirin EC  81 mg Oral Daily  . azithromycin  500 mg Intravenous Q24H  . cefTRIAXone (ROCEPHIN)  IV  1 g Intravenous Q24H  . enoxaparin (LOVENOX) injection  40 mg Subcutaneous Daily  . fluorometholone  1 drop Right Eye BID  . furosemide  20 mg Oral Daily  . guaiFENesin  600 mg  Oral BID  . insulin aspart  0-15 Units Subcutaneous TID WC  . insulin aspart  0-5 Units Subcutaneous QHS  . insulin detemir  40 Units Subcutaneous QPM  . irbesartan  75 mg Oral Daily  . levothyroxine  150 mcg Oral QAC breakfast  . metoprolol succinate  25 mg Oral BID  . montelukast  10 mg Oral QHS  . pantoprazole  40 mg Oral Daily  . pregabalin  150 mg Oral TID  . simvastatin  20 mg Oral QHS   Continuous Infusions:    LOS: 1 day    Time spent: >30 MINS    [facility]  [facility] Pager [phone]. If 7PM-7AM, please contact night-coverage at [email or web address], password [facility] 09/13/2015, 8:35 AM  LOS: 1 day

## 2015-09-14 ENCOUNTER — Inpatient Hospital Stay (HOSPITAL_COMMUNITY): Payer: Medicare Other

## 2015-09-14 LAB — COMPREHENSIVE METABOLIC PANEL
ALBUMIN: 2.5 g/dL — AB (ref 3.5–5.0)
ALT: 26 U/L (ref 14–54)
ANION GAP: 10 (ref 5–15)
AST: 17 U/L (ref 15–41)
Alkaline Phosphatase: 83 U/L (ref 38–126)
BUN: 20 mg/dL (ref 6–20)
CALCIUM: 8.5 mg/dL — AB (ref 8.9–10.3)
CHLORIDE: 109 mmol/L (ref 101–111)
CO2: 24 mmol/L (ref 22–32)
Creatinine, Ser: 0.98 mg/dL (ref 0.44–1.00)
GFR calc non Af Amer: >60 mL/min (ref >60)
GLUCOSE: 320 mg/dL — AB (ref 65–99)
POTASSIUM: 4.4 mmol/L (ref 3.5–5.1)
SODIUM: 143 mmol/L (ref 135–145)
Total Bilirubin: 0.4 mg/dL (ref 0.3–1.2)
Total Protein: 6.5 g/dL (ref 6.5–8.1)

## 2015-09-14 LAB — CBC
HCT: 32.2 % — ABNORMAL LOW (ref 36.0–46.0)
HEMOGLOBIN: 10.5 g/dL — AB (ref 12.0–15.0)
MCH: 26 pg (ref 26.0–34.0)
MCHC: 32.6 g/dL (ref 30.0–36.0)
MCV: 79.7 fL (ref 78.0–100.0)
PLATELETS: 153 10*3/uL (ref 150–400)
RBC: 4.04 MIL/uL (ref 3.87–5.11)
RDW: 14.3 % (ref 11.5–15.5)
WBC: 6.3 10*3/uL (ref 4.0–10.5)

## 2015-09-14 LAB — GLUCOSE, CAPILLARY
GLUCOSE-CAPILLARY: 235 mg/dL — AB (ref 65–99)
GLUCOSE-CAPILLARY: 313 mg/dL — AB (ref 65–99)
Glucose-Capillary: 37 mg/dL — CL (ref 65–99)
Glucose-Capillary: 317 mg/dL — ABNORMAL HIGH (ref 65–99)

## 2015-09-14 MED ORDER — INSULIN DETEMIR 100 UNIT/ML ~~LOC~~ SOLN
20.0000 [IU] | Freq: Once | SUBCUTANEOUS | Status: AC
  Administered 2015-09-14: 20 [IU] via SUBCUTANEOUS
  Filled 2015-09-14: qty 0.2

## 2015-09-14 MED ORDER — SODIUM CHLORIDE 0.9% FLUSH
10.0000 mL | INTRAVENOUS | Status: DC | PRN
  Administered 2015-09-17 – 2015-09-19 (×3): 10 mL
  Administered 2015-09-20 (×2): 20 mL
  Administered 2015-09-20: 10 mL
  Filled 2015-09-14 (×6): qty 40

## 2015-09-14 MED ORDER — PREDNISONE 50 MG PO TABS
50.0000 mg | ORAL_TABLET | Freq: Every day | ORAL | Status: DC
  Administered 2015-09-15: 50 mg via ORAL
  Filled 2015-09-14: qty 1

## 2015-09-14 MED ORDER — INSULIN DETEMIR 100 UNIT/ML ~~LOC~~ SOLN
40.0000 [IU] | Freq: Every evening | SUBCUTANEOUS | Status: DC
  Administered 2015-09-14: 40 [IU] via SUBCUTANEOUS
  Filled 2015-09-14 (×2): qty 0.4

## 2015-09-14 NOTE — Progress Notes (Signed)
[facility] PROGRESS NOTE  Amy [name] [id number] DOB: 01-17-1961 DOA: 09/12/2015   PCP: Velna Hatchet, MD     Assessment/Plan: Principal Problem:   CAP (community acquired pneumonia) Active Problems:   Hypothyroidism   Nausea vomiting and diarrhea   Diabetes mellitus with neurological manifestations ([place])   Hypoxia   HPI: Amy [name] is a 55 y.o. female with medical history significant of diabetes mellitus with complications of neuropathy, remote tobacco abuse, legally blind, hypothyroidism, HTN, HLD, asthma; who presents with complaints of cough and shortness of breath. Symptoms initially started 3 days ago with generalized aches, productive cough, nausea, vomiting, diarrhea. The following day she was was seen at the emergency department at [facility] and diagnosed with bronchopneumonia on x-ray. She was given prescription for cefuroxime and doxycycline, but did not take these medications once home as she has an allergy to penicillins. Although legally blind at baseline patient lives at home alone.  Assessment and plan Community-acquired pneumonia with hypoxia:  Patient presents with cough and shortness of breath. Hypoxia with ambulation down to 82% Chest x-ray reveals a right upper lobe pneumonia. Continues to have intermittent fevers Place on telemetry - Continuous pulse oximetry with nasal cannula oxygen to keep O2 sats greater than 92%  Continue antibiotics of ceftriaxone and azithromycin , day 3 Sputum culture , blood culture no growth so far - DuoNeb's every 2 hours when necessary shortness of breath , patient also wheezing, therefore started prednisone- Mucinex - Tessalon Perles prn cough Continue current management as the patient is still spiking fevers HIV nonreactive Patient may need a CT scan to rule out empyema if she continues to spike a fever today  Diabetes mellitus type 1 with neuropathy: Uncontrolled  Patient with blood glucose of 307  on admission, but no anion gap noted. Elevated secondary to steroids Continue Levemir, 20 units today due to steroid induced hyperglycemia, taper steroids Change Accu-Cheks to twice a day before meals - Continue Lyrica for neuropathy  Acute kidney injury- 1.31>0.98 resolved   Nausea, vomiting, diarrhea: Acute on chronic - Continue to monitor and workup further if warranted  Essential hypertension - Continue metoprolol, irbesartan, hold Lasix  Hypothyroidism - Continue levothyroxine  Hyperlipidemia - Continue simvastatin   GERD - Continue Protonix     DVT prophylaxsis Lovenox  Code Status:  Full code   Family Communication: Discussed in detail with the patient, all imaging results, lab results explained to the patient   Disposition Plan:  Anticipate discharge in 2-3 days      Consultants:  None*  Procedures:  None  Antibiotics: Rocephin 4 /29 Azithromycin  4/29       HPI/Subjective: Febrile last night, CBG elevated,  Objective: Filed Vitals:   09/13/15 1135 09/13/15 1601 09/13/15 2201 09/14/15 0452  BP:  111/57 131/63 139/73  Pulse:  73 82 86  Temp: 99.3 F (37.4 C) 98.3 F (36.8 C) 99.3 F (37.4 C) 98.7 F (37.1 C)  TempSrc:   Oral Oral  Resp:  17 16 16   Height:      Weight:      SpO2:  98% 100% 97%    Intake/Output Summary (Last 24 hours) at 09/14/15 0921 Last data filed at 09/14/15 0636  Gross per 24 hour  Intake   1575 ml  Output    200 ml  Net   1375 ml    Exam:  Examination:  General exam: Appears calm and comfortable  Respiratory system: Wheezing auscultation.  Respiratory effort normal. Cardiovascular system: S1 & S2 heard, RRR. No JVD, murmurs, rubs, gallops or clicks. No pedal edema. Gastrointestinal system: Abdomen is nondistended, soft and nontender. No organomegaly or masses felt. Normal bowel sounds heard. Central nervous system: Alert and oriented. No focal neurological deficits. Extremities: Symmetric 5 x 5  power. Skin: No rashes, lesions or ulcers Psychiatry: Judgement and insight appear normal. Mood & affect appropriate.     Data Reviewed: I have personally reviewed following labs and imaging studies  Micro Results Recent Results (from the past 240 hour(s))  Blood Culture (routine x 2)     Status: None (Preliminary result)   Collection Time: 09/11/15  3:00 AM  Result Value Ref Range Status   Specimen Description BLOOD LEFT ANTECUBITAL  Final   Special Requests BOTTLES DRAWN AEROBIC AND ANAEROBIC 5CC  Final   Culture   Final    NO GROWTH 2 DAYS Performed at [facility]    Report Status PENDING  Incomplete  Blood Culture (routine x 2)     Status: None (Preliminary result)   Collection Time: 09/11/15  3:09 AM  Result Value Ref Range Status   Specimen Description BLOOD RIGHT HAND  Final   Special Requests BOTTLES DRAWN AEROBIC ONLY 5CC  Final   Culture   Final    NO GROWTH 2 DAYS Performed at [facility]    Report Status PENDING  Incomplete  Culture, blood (Routine X 2) w Reflex to ID Panel     Status: None (Preliminary result)   Collection Time: 09/12/15  1:53 AM  Result Value Ref Range Status   Specimen Description BLOOD RIGHT ARM  Final   Special Requests BOTTLES DRAWN AEROBIC AND ANAEROBIC 5ML  Final   Culture NO GROWTH 1 DAY  Final   Report Status PENDING  Incomplete  Culture, blood (Routine X 2) w Reflex to ID Panel     Status: None (Preliminary result)   Collection Time: 09/12/15  2:30 AM  Result Value Ref Range Status   Specimen Description BLOOD LEFT ARM  Final   Special Requests BOTTLES DRAWN AEROBIC AND ANAEROBIC 5ML  Final   Culture NO GROWTH 1 DAY  Final   Report Status PENDING  Incomplete    Radiology Reports Ct Abdomen Pelvis Wo Contrast  09/11/2015  CLINICAL DATA:  Lower abdominal pain for 1 day.  Contrast allergy EXAM: CT ABDOMEN AND PELVIS WITHOUT CONTRAST TECHNIQUE: Multidetector CT imaging of the abdomen and pelvis was performed following  the standard protocol without IV contrast. COMPARISON:  06/14/2015 FINDINGS: Lower chest and abdominal wall: Medication injection sites in the subcutaneous lower abdominal wall Hepatobiliary: Roughly 15 mm low-density lesion in the right liver is subtle. No evidence of growth since 2013 comparison, compatible with a benign process.Cholecystectomy. Negative common bile duct. Pancreas: Unremarkable. Spleen: Unremarkable. Adrenals/Urinary Tract: Negative adrenals. No hydronephrosis or stone. Physiologic distension of the bladder. Chronic wall thickening which is nonspecific - no pericystic inflammation. Reproductive:Hysterectomy with negative adnexae. Stomach/Bowel:  No obstruction. No appendicitis. Vascular/Lymphatic: Prominent atherosclerosis for age. No mass or adenopathy. Peritoneal: No ascites or pneumoperitoneum. Musculoskeletal: No acute abnormalities. IMPRESSION: No acute finding or change from prior. No explanation for abdominal pain. Electronically Signed   By: Monte Fantasia M.D.   On: 09/11/2015 05:52   Dg Chest 2 View  09/12/2015  CLINICAL DATA:  Worsening shortness of breath EXAM: CHEST  2 VIEW COMPARISON:  Yesterday FINDINGS: Unchanged right upper lobe acute airspace disease consistent with pneumonia. Increased interstitial  coarsening and fissural thickening. Normal heart size and mediastinal contours. No effusion or pneumothorax. IMPRESSION: 1. Essentially stable right upper lobe pneumonia. 2. New interstitial coarsening suggesting early edema. Electronically Signed   By: Monte Fantasia M.D.   On: 09/12/2015 02:34   Dg Chest 2 View  08/26/2015  CLINICAL DATA:  Bilateral leg swelling for 2 weeks. Shortness of breath. EXAM: CHEST  2 VIEW COMPARISON:  07/05/2015 FINDINGS: Peribronchial thickening and interstitial prominence within the lungs. Heart is normal size. No effusions. No acute bony abnormality. IMPRESSION: Peribronchial thickening and interstitial prominence, new since prior study. This  could represent bronchitis or interstitial pneumonia. Electronically Signed   By: Rolm Baptise M.D.   On: 08/26/2015 20:51   Dg Chest Port 1 View  09/11/2015  CLINICAL DATA:  Presyncope, diarrhea, nausea and body aches. History of asthma, diabetes, hypertension. EXAM: PORTABLE CHEST 1 VIEW COMPARISON:  Chest radiograph August 26, 2015 FINDINGS: New large consolidation RIGHT mid upper lung zone. Mild bronchitic changes. Cardiomediastinal silhouette is normal. No pneumothorax. Large body habitus. Osseous structure nonsuspicious. IMPRESSION: New large RIGHT lung consolidation concerning for bronchopneumonia. Followup PA and lateral chest X-ray is recommended in 3-4 weeks following trial of antibiotic therapy to ensure resolution and exclude underlying malignancy. Electronically Signed   By: Elon Alas M.D.   On: 09/11/2015 03:27     CBC  Recent Labs Lab 09/11/15 0211 09/12/15 0130 09/13/15 0803 09/14/15 0744  WBC 9.0 7.7 8.7 6.3  HGB 11.3* 10.8* 10.6* 10.5*  HCT 34.0* 33.6* 32.9* 32.2*  PLT 161 146* 133* 153  MCV 77.6* 81.2 81.2 79.7  MCH 25.8* 26.1 26.2 26.0  MCHC 33.2 32.1 32.2 32.6  RDW 14.4 14.7 14.7 14.3  LYMPHSABS  --  1.1  --   --   MONOABS  --  0.8  --   --   EOSABS  --  0.0  --   --   BASOSABS  --  0.0  --   --     Chemistries   Recent Labs Lab 09/11/15 0211 09/12/15 0130 09/13/15 0803 09/14/15 0744  NA 141 143 141 143  K 3.8 3.7 3.6 4.4  CL 106 108 106 109  CO2 26 25 27 24   GLUCOSE 226* 307* 217* 320*  BUN 17 16 17 20   CREATININE 0.81 0.90 1.31* 0.98  CALCIUM 9.1 8.6* 8.4* 8.5*  AST 15  --   --  17  ALT 17  --   --  26  ALKPHOS 114  --   --  83  BILITOT 0.4  --   --  0.4   ------------------------------------------------------------------------------------------------------------------ estimated creatinine clearance is 60.8 mL/min (by C-G formula based on Cr of  0.98). ------------------------------------------------------------------------------------------------------------------ No results for input(s): HGBA1C in the last 72 hours. ------------------------------------------------------------------------------------------------------------------ No results for input(s): CHOL, HDL, LDLCALC, TRIG, CHOLHDL, LDLDIRECT in the last 72 hours. ------------------------------------------------------------------------------------------------------------------ No results for input(s): TSH, T4TOTAL, T3FREE, THYROIDAB in the last 72 hours.  Invalid input(s): FREET3 ------------------------------------------------------------------------------------------------------------------ No results for input(s): VITAMINB12, FOLATE, FERRITIN, TIBC, IRON, RETICCTPCT in the last 72 hours.  Coagulation profile No results for input(s): INR, PROTIME in the last 168 hours.  No results for input(s): DDIMER in the last 72 hours.  Cardiac Enzymes No results for input(s): CKMB, TROPONINI, MYOGLOBIN in the last 168 hours.  Invalid input(s): CK ------------------------------------------------------------------------------------------------------------------ Invalid input(s): POCBNP   CBG:  Recent Labs Lab 09/13/15 0817 09/13/15 1202 09/13/15 1703 09/13/15 2159 09/14/15 0745  GLUCAP 175* 336* 130* 301* 317*  Studies: No results found.    Lab Results  Component Value Date   HGBA1C 10.1* 11/01/2014   HGBA1C 10.8* 08/11/2014   HGBA1C 11.2* 10/22/2012   Lab Results  Component Value Date   MICROALBUR 9.74* 10/30/2012   LDLCALC 92 10/22/2012   CREATININE 0.98 09/14/2015       Scheduled Meds: . aspirin EC  81 mg Oral Daily  . azithromycin  500 mg Intravenous Q24H  . cefTRIAXone (ROCEPHIN)  IV  1 g Intravenous Q24H  . enoxaparin (LOVENOX) injection  40 mg Subcutaneous Daily  . fluorometholone  1 drop Right Eye BID  . guaiFENesin  600 mg Oral BID   . HYDROcodone-homatropine  5 mL Oral Q12H  . insulin aspart  0-15 Units Subcutaneous TID WC  . insulin aspart  0-5 Units Subcutaneous QHS  . insulin detemir  20 Units Subcutaneous Once  . insulin detemir  40 Units Subcutaneous QPM  . irbesartan  75 mg Oral Daily  . levothyroxine  150 mcg Oral QAC breakfast  . metoprolol succinate  25 mg Oral BID  . montelukast  10 mg Oral QHS  . pantoprazole  40 mg Oral Daily  . [START ON 09/15/2015] predniSONE  50 mg Oral Q breakfast  . pregabalin  150 mg Oral TID  . simvastatin  20 mg Oral QHS   Continuous Infusions: . sodium chloride 75 mL/hr at 09/14/15 0230     LOS: 2 days    Time spent: >30 MINS    [facility]  [facility] Pager [phone]. If 7PM-7AM, please contact night-coverage at [email or web address], password [facility] 09/14/2015, 9:21 AM  LOS: 2 days

## 2015-09-14 NOTE — Progress Notes (Signed)
Inpatient Diabetes Program Recommendations  AACE/ADA: New Consensus Statement on Inpatient Glycemic Control (2015)  Target Ranges:  Prepandial:   less than 140 mg/dL      Peak postprandial:   less than 180 mg/dL (1-2 hours)      Critically ill patients:  140 - 180 mg/dL    Results for VALENTINA, DEVEY (MRN [name]) as of 09/14/2015 10:31  Ref. Range 09/12/2015 06:06 09/12/2015 08:49 09/12/2015 08:52 09/12/2015 09:46 09/12/2015 11:44 09/12/2015 17:04 09/12/2015 21:58  Glucose-Capillary Latest Ref Range: 65-99 mg/dL 97 37 (LL) 31 (LL) 127 (H) 80 129 (H) 175 (H)   Results for AOKI, SAAH (MRN [name]) as of 09/14/2015 10:31  Ref. Range 09/13/2015 08:17 09/13/2015 12:02 09/13/2015 17:03 09/13/2015 21:59  Glucose-Capillary Latest Ref Range: 65-99 mg/dL 175 (H) 336 (H) 130 (H) 301 (H)   Results for JULIEANNA, AGYEMAN (MRN [name]) as of 09/14/2015 10:31  Ref. Range 09/14/2015 07:45  Glucose-Capillary Latest Ref Range: 65-99 mg/dL 317 (H)    Admit with: Pneumonia  History: DM  Home DM Meds: Levemir 45 units QPM       Humalog 8 units with breakfast/ 10 units with lunch/ 14 units with dinner       Trulicity Qweek  Current Insulin Orders: Levemir 40 units QHS      Novolog Moderate Correction Scale/ SSI (0-15 units) TID AC + HS       -Note Prednisone decreased to 50 mg daily today (was 40 mg bid).  -Also note patient had severe Hypoglycemia on AM of 04/29, however, she had NOT received any insulin in the hospital prior to that Hypoglycemic event.  Suspect Hypoglycemia was due to insulin on board from home.  -Postprandial glucose levels quite elevated yesterday likely due to Prednisone.  -Note that patient to receive an extra 20 units Levemir this AM and dose increased to 40 units QHS for tonight.     MD- Please consider starting a portion of patient's home dose of meal coverage as well-  Novolog 4 units tidwc to start (hold if pt eats <50% of meals)      --Will follow patient during  hospitalization--  Wyn Quaker RN, MSN, CDE Diabetes Coordinator Inpatient Glycemic Control Team Team Pager: [phone] (8a-5p)

## 2015-09-15 ENCOUNTER — Inpatient Hospital Stay (HOSPITAL_COMMUNITY): Payer: Medicare Other

## 2015-09-15 DIAGNOSIS — E032 Hypothyroidism due to medicaments and other exogenous substances: Secondary | ICD-10-CM

## 2015-09-15 DIAGNOSIS — R0602 Shortness of breath: Secondary | ICD-10-CM

## 2015-09-15 LAB — C DIFFICILE QUICK SCREEN W PCR REFLEX
C DIFFICILE (CDIFF) TOXIN: NEGATIVE
C DIFFICLE (CDIFF) ANTIGEN: NEGATIVE
C Diff interpretation: NEGATIVE

## 2015-09-15 LAB — GLUCOSE, CAPILLARY
GLUCOSE-CAPILLARY: 208 mg/dL — AB (ref 65–99)
GLUCOSE-CAPILLARY: 150 mg/dL — AB (ref 65–99)
GLUCOSE-CAPILLARY: 291 mg/dL — AB (ref 65–99)
GLUCOSE-CAPILLARY: 385 mg/dL — AB (ref 65–99)
GLUCOSE-CAPILLARY: 281 mg/dL — AB (ref 65–99)
Glucose-Capillary: 30 mg/dL — CL (ref 65–99)
Glucose-Capillary: 47 mg/dL — ABNORMAL LOW (ref 65–99)
Glucose-Capillary: 89 mg/dL (ref 65–99)

## 2015-09-15 LAB — TROPONIN I
Troponin I: <0.03 ng/mL (ref <0.031)
Troponin I: <0.03 ng/mL (ref <0.031)

## 2015-09-15 MED ORDER — SACCHAROMYCES BOULARDII 250 MG PO CAPS
250.0000 mg | ORAL_CAPSULE | Freq: Two times a day (BID) | ORAL | Status: DC
  Administered 2015-09-15 – 2015-09-21 (×13): 250 mg via ORAL
  Filled 2015-09-15 (×13): qty 1

## 2015-09-15 MED ORDER — METHYLPREDNISOLONE SODIUM SUCC 125 MG IJ SOLR
60.0000 mg | Freq: Two times a day (BID) | INTRAMUSCULAR | Status: DC
  Administered 2015-09-15 – 2015-09-18 (×6): 60 mg via INTRAVENOUS
  Filled 2015-09-15 (×6): qty 2

## 2015-09-15 MED ORDER — DIPHENOXYLATE-ATROPINE 2.5-0.025 MG PO TABS
1.0000 | ORAL_TABLET | Freq: Four times a day (QID) | ORAL | Status: DC | PRN
  Administered 2015-09-15 – 2015-09-17 (×3): 1 via ORAL
  Filled 2015-09-15 (×3): qty 1

## 2015-09-15 MED ORDER — INSULIN DETEMIR 100 UNIT/ML ~~LOC~~ SOLN
35.0000 [IU] | Freq: Every evening | SUBCUTANEOUS | Status: DC
  Administered 2015-09-15: 35 [IU] via SUBCUTANEOUS
  Filled 2015-09-15 (×3): qty 0.35

## 2015-09-15 MED ORDER — INSULIN DETEMIR 100 UNIT/ML ~~LOC~~ SOLN
30.0000 [IU] | Freq: Every evening | SUBCUTANEOUS | Status: DC
  Filled 2015-09-15: qty 0.3

## 2015-09-15 MED ORDER — AZITHROMYCIN 500 MG PO TABS
500.0000 mg | ORAL_TABLET | Freq: Every day | ORAL | Status: DC
  Administered 2015-09-16 – 2015-09-17 (×2): 500 mg via ORAL
  Filled 2015-09-15 (×2): qty 1

## 2015-09-15 MED ORDER — GLUCOSE-VITAMIN C 4-6 GM-MG PO CHEW
CHEWABLE_TABLET | ORAL | Status: AC
  Administered 2015-09-15: 4 g
  Filled 2015-09-15: qty 1

## 2015-09-15 NOTE — Care Management Important Message (Signed)
Important Message  Patient Details  Name: ZHURI FIXICO MRN: [id number] Date of Birth: 18-Jun-1960   Medicare Important Message Given:  Yes    Nathen May 09/15/2015, 12:26 PM

## 2015-09-15 NOTE — Progress Notes (Signed)
UR COMPLETED  

## 2015-09-15 NOTE — Progress Notes (Signed)
Hypoglycemic Event  CBG: 30  Treatment: 15 GM carbohydrate snack  Symptoms: Shaky  Follow-up CBG: Time:626 CBG Result:47  Possible Reasons for Event: Medication regimen: Levemir  Comments/MD notified:Paged Caryl Pina, [place] M

## 2015-09-15 NOTE — Progress Notes (Signed)
Inpatient Diabetes Program Recommendations  AACE/ADA: New Consensus Statement on Inpatient Glycemic Control (2015)  Target Ranges:  Prepandial:   less than 140 mg/dL      Peak postprandial:   less than 180 mg/dL (1-2 hours)      Critically ill patients:  140 - 180 mg/dL   Review of Glycemic Control Results for Amy [name], Amy [name] (MRN [name]) as of 09/15/2015 11:34  Ref. Range 09/15/2015 08:02  Glucose-Capillary Latest Ref Range: 65-99 mg/dL 150 (H)   Home DM Meds: Levemir 45 units QPM  Humalog 8 units with breakfast/ 10 units with lunch/ 14 units with dinner  Trulicity Qweek  Current Insulin Orders: Levemir 40 units QHS  Novolog Moderate Correction Scale/ SSI (0-15 units) TID AC + HS  Inpatient Diabetes Program Recommendations:  Noted hypoglycemia this morning likely due to receiving too much basal insulin. Noted received Levemir total of 60 units (20 units @ 12:41 & 40 units @ [id number]) yesterday so would not recommend any changes in basal insulin today, leave as currently ordered Levemir 40 units q pm and consider adding meal coverage tid of Novolog 5 units (hold if eats < 50%).  Thank you, Nani Gasser. Ilina Xu, RN, MSN, CDE Inpatient Glycemic Control Team Team Pager [phone] (8am-5pm) 09/15/2015 11:38 AM

## 2015-09-15 NOTE — Progress Notes (Signed)
[facility] PROGRESS NOTE  Amy [name] [id number] DOB: Dec 10, 1960 DOA: 09/12/2015   PCP: Velna Hatchet, MD     Assessment/Plan: Principal Problem:   CAP (community acquired pneumonia) Active Problems:   Hypothyroidism   Nausea vomiting and diarrhea   Diabetes mellitus with neurological manifestations ([place])   Hypoxia   SOB (shortness of breath)   HPI: Amy [name] is a 55 y.o. female with medical history significant of diabetes mellitus with complications of neuropathy, remote tobacco abuse, legally blind, hypothyroidism, HTN, HLD, asthma; who presents with complaints of cough and shortness of breath. Symptoms initially started 3 days ago with generalized aches, productive cough, nausea, vomiting, diarrhea. The following day she was was seen at the emergency department at [facility] and diagnosed with bronchopneumonia on x-ray. She was given prescription for cefuroxime and doxycycline, but did not take these medications once home as she has an allergy to penicillins. Although legally blind at baseline patient lives at home alone.  Assessment and plan Community-acquired pneumonia with hypoxia:  Continues to have cough and shortness of breath. Hypoxia with ambulation down to 82% Chest x-ray reveals a right upper lobe pneumonia. Continues to have intermittent fevers Place on telemetry - Continuous pulse oximetry with nasal cannula oxygen to keep O2 sats greater than 92%  Continue antibiotics of ceftriaxone and azithromycin , day 4, continue steroids and continue to attempt tapering them Sputum culture , blood culture no growth so far - DuoNeb's every 2 hours when necessary shortness of breath , patient also wheezing, therefore started prednisone- Mucinex - Tessalon Perles prn cough Continue current management as the patient is still spiking fevers HIV nonreactive CT shows multifocal airspace disease without empyema  Diarrhea-C. difficile negative Start the  patient on florastor   Diabetes mellitus type 1 with neuropathy: Hypoglycemic this morning Blood sugar initially elevated secondary to steroids Increase Levemir yesterday but hypoglycemic this morning Change Accu-Cheks 3 times a day, continue Levemir 35 units,  Continue sliding scale insulin Continuing, peripheral neuropathy   Acute kidney injury- 1.31>0.98 resolved   Nausea, vomiting, diarrhea: Acute on chronic - Continue to monitor and workup further if warranted  Essential hypertension   - Continue metoprolol,hold  irbesartan, hold Lasix as BP soft   Hypothyroidism - Continue levothyroxine  Hyperlipidemia - Continue simvastatin   GERD - Continue Protonix     DVT prophylaxsis Lovenox  Code Status:  Full code   Family Communication: Discussed in detail with the patient, all imaging results, lab results explained to the patient   Disposition Plan:  Anticipate discharge in 2-3 days      Consultants:  None*  Procedures:  None  Antibiotics: Rocephin 4 /29 Azithromycin  4/29       HPI/Subjective:  Hypoglycemic this morning, blood pressure soft  Objective: Filed Vitals:   09/14/15 2127 09/15/15 0455 09/15/15 0824 09/15/15 1049  BP: 141/71 131/68 103/51 103/47  Pulse: 71 65 77 81  Temp: 98.2 F (36.8 C) 97.5 F (36.4 C)    TempSrc: Oral Oral    Resp: 16 18 22    Height:      Weight:      SpO2: 100% 100% 97%     Intake/Output Summary (Last 24 hours) at 09/15/15 1323 Last data filed at 09/15/15 1136  Gross per 24 hour  Intake  795.5 ml  Output      0 ml  Net  795.5 ml    Exam:  Examination:  General exam: Appears  calm and comfortable  Respiratory system: Wheezing auscultation. Respiratory effort normal. Cardiovascular system: S1 & S2 heard, RRR. No JVD, murmurs, rubs, gallops or clicks. No pedal edema. Gastrointestinal system: Abdomen is nondistended, soft and nontender. No organomegaly or masses felt. Normal bowel sounds  heard. Central nervous system: Alert and oriented. No focal neurological deficits. Extremities: Symmetric 5 x 5 power. Skin: No rashes, lesions or ulcers Psychiatry: Judgement and insight appear normal. Mood & affect appropriate.     Data Reviewed: I have personally reviewed following labs and imaging studies  Micro Results Recent Results (from the past 240 hour(s))  Blood Culture (routine x 2)     Status: None (Preliminary result)   Collection Time: 09/11/15  3:00 AM  Result Value Ref Range Status   Specimen Description BLOOD LEFT ANTECUBITAL  Final   Special Requests BOTTLES DRAWN AEROBIC AND ANAEROBIC 5CC  Final   Culture   Final    NO GROWTH 4 DAYS Performed at [facility]    Report Status PENDING  Incomplete  Blood Culture (routine x 2)     Status: None (Preliminary result)   Collection Time: 09/11/15  3:09 AM  Result Value Ref Range Status   Specimen Description BLOOD RIGHT HAND  Final   Special Requests BOTTLES DRAWN AEROBIC ONLY 5CC  Final   Culture   Final    NO GROWTH 4 DAYS Performed at [facility]    Report Status PENDING  Incomplete  Culture, blood (Routine X 2) w Reflex to ID Panel     Status: None (Preliminary result)   Collection Time: 09/12/15  1:53 AM  Result Value Ref Range Status   Specimen Description BLOOD RIGHT ARM  Final   Special Requests BOTTLES DRAWN AEROBIC AND ANAEROBIC 5ML  Final   Culture NO GROWTH 3 DAYS  Final   Report Status PENDING  Incomplete  Culture, blood (Routine X 2) w Reflex to ID Panel     Status: None (Preliminary result)   Collection Time: 09/12/15  2:30 AM  Result Value Ref Range Status   Specimen Description BLOOD LEFT ARM  Final   Special Requests BOTTLES DRAWN AEROBIC AND ANAEROBIC 5ML  Final   Culture NO GROWTH 3 DAYS  Final   Report Status PENDING  Incomplete  C difficile quick scan w PCR reflex     Status: None   Collection Time: 09/14/15 10:43 PM  Result Value Ref Range Status   C Diff antigen  NEGATIVE NEGATIVE Final   C Diff toxin NEGATIVE NEGATIVE Final   C Diff interpretation Negative for toxigenic C. difficile  Final    Radiology Reports Ct Abdomen Pelvis Wo Contrast  09/11/2015  CLINICAL DATA:  Lower abdominal pain for 1 day.  Contrast allergy EXAM: CT ABDOMEN AND PELVIS WITHOUT CONTRAST TECHNIQUE: Multidetector CT imaging of the abdomen and pelvis was performed following the standard protocol without IV contrast. COMPARISON:  06/14/2015 FINDINGS: Lower chest and abdominal wall: Medication injection sites in the subcutaneous lower abdominal wall Hepatobiliary: Roughly 15 mm low-density lesion in the right liver is subtle. No evidence of growth since 2013 comparison, compatible with a benign process.Cholecystectomy. Negative common bile duct. Pancreas: Unremarkable. Spleen: Unremarkable. Adrenals/Urinary Tract: Negative adrenals. No hydronephrosis or stone. Physiologic distension of the bladder. Chronic wall thickening which is nonspecific - no pericystic inflammation. Reproductive:Hysterectomy with negative adnexae. Stomach/Bowel:  No obstruction. No appendicitis. Vascular/Lymphatic: Prominent atherosclerosis for age. No mass or adenopathy. Peritoneal: No ascites or pneumoperitoneum. Musculoskeletal: No acute abnormalities. IMPRESSION:  No acute finding or change from prior. No explanation for abdominal pain. Electronically Signed   By: Monte Fantasia M.D.   On: 09/11/2015 05:52   Dg Chest 2 View  09/12/2015  CLINICAL DATA:  Worsening shortness of breath EXAM: CHEST  2 VIEW COMPARISON:  Yesterday FINDINGS: Unchanged right upper lobe acute airspace disease consistent with pneumonia. Increased interstitial coarsening and fissural thickening. Normal heart size and mediastinal contours. No effusion or pneumothorax. IMPRESSION: 1. Essentially stable right upper lobe pneumonia. 2. New interstitial coarsening suggesting early edema. Electronically Signed   By: Monte Fantasia M.D.   On: 09/12/2015  02:34   Dg Chest 2 View  08/26/2015  CLINICAL DATA:  Bilateral leg swelling for 2 weeks. Shortness of breath. EXAM: CHEST  2 VIEW COMPARISON:  07/05/2015 FINDINGS: Peribronchial thickening and interstitial prominence within the lungs. Heart is normal size. No effusions. No acute bony abnormality. IMPRESSION: Peribronchial thickening and interstitial prominence, new since prior study. This could represent bronchitis or interstitial pneumonia. Electronically Signed   By: Rolm Baptise M.D.   On: 08/26/2015 20:51   Ct Chest Wo Contrast  09/14/2015  CLINICAL DATA:  Shortness of breath, left-sided chest pain, fever and cough. EXAM: CT CHEST WITHOUT CONTRAST TECHNIQUE: Multidetector CT imaging of the chest was performed following the standard protocol without IV contrast. COMPARISON:  Chest x-ray 09/12/2015. FINDINGS: Mediastinum/Nodes: No breast masses, supraclavicular or axillary lymphadenopathy. Small scattered lymph nodes are noted bilaterally. Dense/ macro calcification noted in the right breast. The heart is normal in size. No pericardial effusion. Age advanced coronary artery calcifications. The aorta is normal in caliber. Borderline mediastinal and hilar lymph nodes likely hyperplastic/ inflammatory related to the lung disease. No mediastinal mass. The esophagus is grossly normal. Lungs/Pleura: Patchy bilateral airspace process most notable in the right upper lobe with more confluent consolidation and air bronchograms. Findings most likely represent multifocal/polylobar pneumonia. No worrisome pulmonary lesions or pleural effusion. Upper abdomen: No significant findings. Status post cholecystectomy. Musculoskeletal: No significant bony findings. IMPRESSION: Multifocal airspace process most likely polylobar pneumonia. Reactive mediastinal and hilar lymph nodes. Age advanced coronary artery calcifications. Electronically Signed   By: Marijo Sanes M.D.   On: 09/14/2015 12:50   Dg Chest Port 1 View  09/11/2015   CLINICAL DATA:  Presyncope, diarrhea, nausea and body aches. History of asthma, diabetes, hypertension. EXAM: PORTABLE CHEST 1 VIEW COMPARISON:  Chest radiograph August 26, 2015 FINDINGS: New large consolidation RIGHT mid upper lung zone. Mild bronchitic changes. Cardiomediastinal silhouette is normal. No pneumothorax. Large body habitus. Osseous structure nonsuspicious. IMPRESSION: New large RIGHT lung consolidation concerning for bronchopneumonia. Followup PA and lateral chest X-ray is recommended in 3-4 weeks following trial of antibiotic therapy to ensure resolution and exclude underlying malignancy. Electronically Signed   By: Elon Alas M.D.   On: 09/11/2015 03:27     CBC  Recent Labs Lab 09/11/15 0211 09/12/15 0130 09/13/15 0803 09/14/15 0744  WBC 9.0 7.7 8.7 6.3  HGB 11.3* 10.8* 10.6* 10.5*  HCT 34.0* 33.6* 32.9* 32.2*  PLT 161 146* 133* 153  MCV 77.6* 81.2 81.2 79.7  MCH 25.8* 26.1 26.2 26.0  MCHC 33.2 32.1 32.2 32.6  RDW 14.4 14.7 14.7 14.3  LYMPHSABS  --  1.1  --   --   MONOABS  --  0.8  --   --   EOSABS  --  0.0  --   --   BASOSABS  --  0.0  --   --  Chemistries   Recent Labs Lab 09/11/15 0211 09/12/15 0130 09/13/15 0803 09/14/15 0744  NA 141 143 141 143  K 3.8 3.7 3.6 4.4  CL 106 108 106 109  CO2 26 25 27 24   GLUCOSE 226* 307* 217* 320*  BUN 17 16 17 20   CREATININE 0.81 0.90 1.31* 0.98  CALCIUM 9.1 8.6* 8.4* 8.5*  AST 15  --   --  17  ALT 17  --   --  26  ALKPHOS 114  --   --  83  BILITOT 0.4  --   --  0.4   ------------------------------------------------------------------------------------------------------------------ estimated creatinine clearance is 60.8 mL/min (by C-G formula based on Cr of 0.98). ------------------------------------------------------------------------------------------------------------------ No results for input(s): HGBA1C in the last 72  hours. ------------------------------------------------------------------------------------------------------------------ No results for input(s): CHOL, HDL, LDLCALC, TRIG, CHOLHDL, LDLDIRECT in the last 72 hours. ------------------------------------------------------------------------------------------------------------------ No results for input(s): TSH, T4TOTAL, T3FREE, THYROIDAB in the last 72 hours.  Invalid input(s): FREET3 ------------------------------------------------------------------------------------------------------------------ No results for input(s): VITAMINB12, FOLATE, FERRITIN, TIBC, IRON, RETICCTPCT in the last 72 hours.  Coagulation profile No results for input(s): INR, PROTIME in the last 168 hours.  No results for input(s): DDIMER in the last 72 hours.  Cardiac Enzymes No results for input(s): CKMB, TROPONINI, MYOGLOBIN in the last 168 hours.  Invalid input(s): CK ------------------------------------------------------------------------------------------------------------------ Invalid input(s): POCBNP   CBG:  Recent Labs Lab 09/15/15 0605 09/15/15 0629 09/15/15 0655 09/15/15 0802 09/15/15 1223  GLUCAP 30* 47* 89 150* 291*       Studies: Ct Chest Wo Contrast  09/14/2015  CLINICAL DATA:  Shortness of breath, left-sided chest pain, fever and cough. EXAM: CT CHEST WITHOUT CONTRAST TECHNIQUE: Multidetector CT imaging of the chest was performed following the standard protocol without IV contrast. COMPARISON:  Chest x-ray 09/12/2015. FINDINGS: Mediastinum/Nodes: No breast masses, supraclavicular or axillary lymphadenopathy. Small scattered lymph nodes are noted bilaterally. Dense/ macro calcification noted in the right breast. The heart is normal in size. No pericardial effusion. Age advanced coronary artery calcifications. The aorta is normal in caliber. Borderline mediastinal and hilar lymph nodes likely hyperplastic/ inflammatory related to the lung disease.  No mediastinal mass. The esophagus is grossly normal. Lungs/Pleura: Patchy bilateral airspace process most notable in the right upper lobe with more confluent consolidation and air bronchograms. Findings most likely represent multifocal/polylobar pneumonia. No worrisome pulmonary lesions or pleural effusion. Upper abdomen: No significant findings. Status post cholecystectomy. Musculoskeletal: No significant bony findings. IMPRESSION: Multifocal airspace process most likely polylobar pneumonia. Reactive mediastinal and hilar lymph nodes. Age advanced coronary artery calcifications. Electronically Signed   By: Marijo Sanes M.D.   On: 09/14/2015 12:50      Lab Results  Component Value Date   HGBA1C 10.1* 11/01/2014   HGBA1C 10.8* 08/11/2014   HGBA1C 11.2* 10/22/2012   Lab Results  Component Value Date   MICROALBUR 9.74* 10/30/2012   LDLCALC 92 10/22/2012   CREATININE 0.98 09/14/2015       Scheduled Meds: . aspirin EC  81 mg Oral Daily  . azithromycin  500 mg Intravenous Q24H  . cefTRIAXone (ROCEPHIN)  IV  1 g Intravenous Q24H  . enoxaparin (LOVENOX) injection  40 mg Subcutaneous Daily  . fluorometholone  1 drop Right Eye BID  . guaiFENesin  600 mg Oral BID  . HYDROcodone-homatropine  5 mL Oral Q12H  . insulin aspart  0-15 Units Subcutaneous TID WC  . insulin aspart  0-5 Units Subcutaneous QHS  . insulin detemir  35 Units Subcutaneous QPM  . irbesartan  75 mg Oral Daily  . levothyroxine  150 mcg Oral QAC breakfast  . methylPREDNISolone (SOLU-MEDROL) injection  60 mg Intravenous Q12H  . metoprolol succinate  25 mg Oral BID  . montelukast  10 mg Oral QHS  . pantoprazole  40 mg Oral Daily  . pregabalin  150 mg Oral TID  . simvastatin  20 mg Oral QHS   Continuous Infusions:     LOS: 3 days    Time spent: >30 MINS    [facility]  [facility] Pager [phone]. If 7PM-7AM, please contact night-coverage at [email or web address], password [facility] 09/15/2015, 1:23 PM  LOS: 3 days

## 2015-09-15 NOTE — Progress Notes (Signed)
Dr. Allyson Sabal made aware of patient's worsening SOB and wheezing. PRN breathing treatments given today as well as PRN cough medicine. MD placed orders.

## 2015-09-16 DIAGNOSIS — E039 Hypothyroidism, unspecified: Secondary | ICD-10-CM

## 2015-09-16 LAB — COMPREHENSIVE METABOLIC PANEL
ALT: 23 U/L (ref 14–54)
AST: 26 U/L (ref 15–41)
Albumin: 2.5 g/dL — ABNORMAL LOW (ref 3.5–5.0)
Alkaline Phosphatase: 81 U/L (ref 38–126)
Anion gap: 10 (ref 5–15)
BUN: 22 mg/dL — ABNORMAL HIGH (ref 6–20)
CHLORIDE: 107 mmol/L (ref 101–111)
CO2: 24 mmol/L (ref 22–32)
CREATININE: 1.07 mg/dL — AB (ref 0.44–1.00)
Calcium: 8.9 mg/dL (ref 8.9–10.3)
GFR calc non Af Amer: 57 mL/min — ABNORMAL LOW (ref >60)
Glucose, Bld: 230 mg/dL — ABNORMAL HIGH (ref 65–99)
Potassium: 5 mmol/L (ref 3.5–5.1)
SODIUM: 141 mmol/L (ref 135–145)
Total Bilirubin: 0.5 mg/dL (ref 0.3–1.2)
Total Protein: 6.7 g/dL (ref 6.5–8.1)

## 2015-09-16 LAB — GLUCOSE, CAPILLARY
GLUCOSE-CAPILLARY: 352 mg/dL — AB (ref 65–99)
GLUCOSE-CAPILLARY: 432 mg/dL — AB (ref 65–99)
Glucose-Capillary: 285 mg/dL — ABNORMAL HIGH (ref 65–99)
Glucose-Capillary: 305 mg/dL — ABNORMAL HIGH (ref 65–99)

## 2015-09-16 LAB — CULTURE, BLOOD (ROUTINE X 2)
Culture: NO GROWTH
Culture: NO GROWTH

## 2015-09-16 LAB — CBC
HEMATOCRIT: 31.9 % — AB (ref 36.0–46.0)
HEMOGLOBIN: 10.2 g/dL — AB (ref 12.0–15.0)
MCH: 25.1 pg — AB (ref 26.0–34.0)
MCHC: 32 g/dL (ref 30.0–36.0)
MCV: 78.4 fL (ref 78.0–100.0)
PLATELETS: 186 10*3/uL (ref 150–400)
RBC: 4.07 MIL/uL (ref 3.87–5.11)
RDW: 14.5 % (ref 11.5–15.5)
WBC: 13.9 10*3/uL — AB (ref 4.0–10.5)

## 2015-09-16 LAB — TROPONIN I: Troponin I: <0.03 ng/mL (ref <0.031)

## 2015-09-16 LAB — STREP PNEUMONIAE URINARY ANTIGEN: STREP PNEUMO URINARY ANTIGEN: NEGATIVE

## 2015-09-16 MED ORDER — LORATADINE 10 MG PO TABS
10.0000 mg | ORAL_TABLET | Freq: Every day | ORAL | Status: DC
  Administered 2015-09-16 – 2015-09-21 (×6): 10 mg via ORAL
  Filled 2015-09-16 (×6): qty 1

## 2015-09-16 MED ORDER — GUAIFENESIN ER 600 MG PO TB12
1200.0000 mg | ORAL_TABLET | Freq: Two times a day (BID) | ORAL | Status: DC
  Administered 2015-09-16 – 2015-09-21 (×10): 1200 mg via ORAL
  Filled 2015-09-16 (×10): qty 2

## 2015-09-16 MED ORDER — SODIUM CHLORIDE 0.9 % IV SOLN
INTRAVENOUS | Status: AC
  Administered 2015-09-16: 22:00:00 via INTRAVENOUS
  Filled 2015-09-16: qty 1000

## 2015-09-16 MED ORDER — INSULIN DETEMIR 100 UNIT/ML ~~LOC~~ SOLN
42.0000 [IU] | Freq: Every evening | SUBCUTANEOUS | Status: DC
  Filled 2015-09-16: qty 0.42

## 2015-09-16 MED ORDER — FLUTICASONE PROPIONATE 50 MCG/ACT NA SUSP
2.0000 | Freq: Every day | NASAL | Status: DC
  Administered 2015-09-16 – 2015-09-21 (×6): 2 via NASAL
  Filled 2015-09-16: qty 16

## 2015-09-16 MED ORDER — IPRATROPIUM-ALBUTEROL 0.5-2.5 (3) MG/3ML IN SOLN
3.0000 mL | Freq: Four times a day (QID) | RESPIRATORY_TRACT | Status: DC
  Administered 2015-09-16 – 2015-09-19 (×14): 3 mL via RESPIRATORY_TRACT
  Filled 2015-09-16 (×14): qty 3

## 2015-09-16 MED ORDER — INSULIN DETEMIR 100 UNIT/ML ~~LOC~~ SOLN
2.0000 [IU] | Freq: Once | SUBCUTANEOUS | Status: AC
  Administered 2015-09-16: 2 [IU] via SUBCUTANEOUS
  Filled 2015-09-16: qty 0.02

## 2015-09-16 MED ORDER — ARFORMOTEROL TARTRATE 15 MCG/2ML IN NEBU
15.0000 ug | INHALATION_SOLUTION | Freq: Two times a day (BID) | RESPIRATORY_TRACT | Status: DC
  Administered 2015-09-16 – 2015-09-21 (×11): 15 ug via RESPIRATORY_TRACT
  Filled 2015-09-16 (×11): qty 2

## 2015-09-16 MED ORDER — INSULIN DETEMIR 100 UNIT/ML ~~LOC~~ SOLN
40.0000 [IU] | Freq: Every evening | SUBCUTANEOUS | Status: DC
  Administered 2015-09-16: 40 [IU] via SUBCUTANEOUS
  Filled 2015-09-16: qty 0.4

## 2015-09-16 MED ORDER — BUDESONIDE 0.25 MG/2ML IN SUSP
0.2500 mg | Freq: Two times a day (BID) | RESPIRATORY_TRACT | Status: DC
  Administered 2015-09-16 – 2015-09-21 (×11): 0.25 mg via RESPIRATORY_TRACT
  Filled 2015-09-16 (×11): qty 2

## 2015-09-16 MED ORDER — GUAIFENESIN ER 600 MG PO TB12
600.0000 mg | ORAL_TABLET | Freq: Once | ORAL | Status: AC
  Administered 2015-09-16: 600 mg via ORAL
  Filled 2015-09-16: qty 1

## 2015-09-16 NOTE — Progress Notes (Signed)
Inpatient Diabetes Program Recommendations  AACE/ADA: New Consensus Statement on Inpatient Glycemic Control (2015)  Target Ranges:  Prepandial:   less than 140 mg/dL      Peak postprandial:   less than 180 mg/dL (1-2 hours)      Critically ill patients:  140 - 180 mg/dL   Review of Glycemic Control  Diabetes history: DM 2 Outpatient Diabetes medications: Trulicity 99991111, Humalog 8 units breakfast, 10 units lunch, 14 units dinner, Levemir 45 Qevening Current orders for Inpatient glycemic control: Levemir 35 units, Novolog Moderate + HS scale  Inpatient Diabetes Program Recommendations:   Insulin - Basal: Patient received a total of 60 of basal 2 days ago and was hypoglycemic yest am. Patient received only 35 units of basal yesterday and fasting this am was 285 mg/dl. Patient takes 45 units of basal at home. Please consider increasing basal insulin to Levemir 42 units.  Thanks,  Tama Headings RN, MSN, [facility] Inpatient Diabetes Coordinator Team Pager [phone] (8a-5p)

## 2015-09-16 NOTE — Progress Notes (Signed)
PROGRESS NOTE    Amy [name]  [id number] DOB: 06-23-1960 DOA: 09/12/2015 PCP: Velna Hatchet, MD  Outpatient Specialists:     Brief Narrative:  Amy [name] is a 55 y.o. female with medical history significant of diabetes mellitus with complications of neuropathy, remote tobacco abuse, legally blind, hypothyroidism, HTN, HLD, asthma; who presents with complaints of cough and shortness of breath. Symptoms initially started 3 days ago with generalized aches, productive cough, nausea, vomiting, diarrhea. The following day she was was seen at the emergency department at [facility] and diagnosed with bronchopneumonia on x-ray. She was given prescription for cefuroxime and doxycycline, but did not take these medications once home as she has an allergy to penicillins. Although legally blind at baseline patient lives at home alone.   Assessment & Plan:   Principal Problem:   CAP (community acquired pneumonia) Active Problems:   Hypothyroidism   Nausea vomiting and diarrhea   Diabetes mellitus with neurological manifestations ([place])   Hypoxia   SOB (shortness of breath)  #1 multifocal community-acquired pneumonia with hypoxia Patient still with cough shortness of breath noted to be hypoxic on ambulation down to 82% on 09/15/2015. Chest x-ray showed bilateral Robert upper lobe pneumonia. Patient with intermittent fevers. CT chest done showed multifocal airspace disease without empyema. Blood cultures pending. Sputum Gram stain and culture pending. Check a urine Legionella antigen. Check a urine pneumococcus antigen. Change DuoNeb stool scheduled. Increase Mucinex to 1200 mg twice a day. Add Pulmicort and Brovana. Add Claritin and Flonase. Continue IV steroids. Continue IV Rocephin and oral azithromycin. Follow.  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 CBGs currently ranging from 281 - 352. Likely in part due to  IV steroids. Increase Levemir to 42 units daily. Continue sliding scale insulin and monitor closely with steroid taper. Follow.  #3 hypothyroidism Continue Synthroid.  #4 diarrhea C. difficile negative. Continue Florastor.  #5 acute kidney injury Resolved.  #6 nausea vomiting diarrhea Improved. Follow.  #7 hypertension Stable. Continue Avapro, Toprol-XL.  #8 gastroesophageal reflux disease PPI.  #9 hyperlipidemia Continue statin.   DVT prophylaxis: Lovenox Code Status: Full Family Communication: Updated patient and friend at bedside. Disposition Plan: Home when medically stable with clinical improvement and resolution of hypoxia.   Consultants:   None  Procedures:   CT chest 09/14/2015  Chest x-ray 09/12/2015, 09/15/2015  Antimicrobials:  Oral azithromycin 09/16/2015  IV Rocephin 09/12/2015  IV azithromycin 09/12/2015>>>> 09/15/2015   Subjective: Patient still with a cough however states some improvement with shortness of breath. No chest pain.  Objective: Filed Vitals:   09/16/15 0946 09/16/15 1013 09/16/15 1353 09/16/15 1449  BP: 128/57  128/58   Pulse: 94 90 92 94  Temp:   98.6 F (37 C)   TempSrc:   Oral   Resp:  18 18 18   Height:      Weight:      SpO2:  99% 96% 95%    Intake/Output Summary (Last 24 hours) at 09/16/15 2026 Last data filed at 09/16/15 1840  Gross per 24 hour  Intake    810 ml  Output    300 ml  Net    510 ml   Filed Weights   09/12/15 0525  Weight: 85.412 kg (188 lb 4.8 oz)    Examination:  General exam: Appears calm and comfortable  Respiratory system: Coarse diffuse rhonchorous breath sounds. Expiratory wheezing. Cardiovascular system: S1 & S2 heard, RRR. No  JVD, murmurs, rubs, gallops or clicks. No pedal edema. Gastrointestinal system: Abdomen is nondistended, soft and nontender. No organomegaly or masses felt. Normal bowel sounds heard. Central nervous system: Alert and oriented. No focal neurological  deficits. Extremities: Symmetric 5 x 5 power. Skin: No rashes, lesions or ulcers Psychiatry: Judgement and insight appear normal. Mood & affect appropriate.     Data Reviewed: I have personally reviewed following labs and imaging studies  CBC:  Recent Labs Lab 09/11/15 0211 09/12/15 0130 09/13/15 0803 09/14/15 0744 09/16/15 0452  WBC 9.0 7.7 8.7 6.3 13.9*  NEUTROABS  --  5.7  --   --   --   HGB 11.3* 10.8* 10.6* 10.5* 10.2*  HCT 34.0* 33.6* 32.9* 32.2* 31.9*  MCV 77.6* 81.2 81.2 79.7 78.4  PLT 161 146* 133* 153 [id number]   Basic Metabolic Panel:  Recent Labs Lab 09/11/15 0211 09/12/15 0130 09/13/15 0803 09/14/15 0744 09/16/15 0452  NA 141 143 141 143 141  K 3.8 3.7 3.6 4.4 5.0  CL 106 108 106 109 107  CO2 26 25 27 24 24   GLUCOSE 226* 307* 217* 320* 230*  BUN 17 16 17 20  22*  CREATININE 0.81 0.90 1.31* 0.98 1.07*  CALCIUM 9.1 8.6* 8.4* 8.5* 8.9   GFR: Estimated Creatinine Clearance: 55.7 mL/min (by C-G formula based on Cr of 1.07). Liver Function Tests:  Recent Labs Lab 09/11/15 0211 09/14/15 0744 09/16/15 0452  AST 15 17 26   ALT 17 26 23   ALKPHOS 114 83 81  BILITOT 0.4 0.4 0.5  PROT 7.0 6.5 6.7  ALBUMIN 3.4* 2.5* 2.5*    Recent Labs Lab 09/11/15 0211  LIPASE 24   No results for input(s): AMMONIA in the last 168 hours. Coagulation Profile: No results for input(s): INR, PROTIME in the last 168 hours. Cardiac Enzymes:  Recent Labs Lab 09/15/15 1325 09/15/15 1924 09/16/15 0400  TROPONINI <0.03 <0.03 <0.03   BNP (last 3 results) No results for input(s): PROBNP in the last 8760 hours. HbA1C: No results for input(s): HGBA1C in the last 72 hours. CBG:  Recent Labs Lab 09/15/15 1657 09/15/15 2117 09/16/15 0757 09/16/15 1215 09/16/15 1716  GLUCAP 385* 281* 285* 352* 432*   Lipid Profile: No results for input(s): CHOL, HDL, LDLCALC, TRIG, CHOLHDL, LDLDIRECT in the last 72 hours. Thyroid Function Tests: No results for input(s): TSH,  T4TOTAL, FREET4, T3FREE, THYROIDAB in the last 72 hours. Anemia Panel: No results for input(s): VITAMINB12, FOLATE, FERRITIN, TIBC, IRON, RETICCTPCT in the last 72 hours. Urine analysis:    Component Value Date/Time   COLORURINE YELLOW 07/05/2015 1307   APPEARANCEUR CLOUDY* 07/05/2015 1307   LABSPEC 1.015 07/05/2015 1307   PHURINE 5.0 07/05/2015 1307   GLUCOSEU >1000* 07/05/2015 1307   HGBUR NEGATIVE 07/05/2015 1307   [place] 07/05/2015 1307   KETONESUR NEGATIVE 07/05/2015 1307   PROTEINUR NEGATIVE 07/05/2015 1307   UROBILINOGEN 0.2 01/16/2015 1121   NITRITE NEGATIVE 07/05/2015 1307   LEUKOCYTESUR NEGATIVE 07/05/2015 1307   Sepsis Labs: @LABRCNTIP (procalcitonin:4,lacticidven:4)  ) Recent Results (from the past 240 hour(s))  Blood Culture (routine x 2)     Status: None   Collection Time: 09/11/15  3:00 AM  Result Value Ref Range Status   Specimen Description BLOOD LEFT ANTECUBITAL  Final   Special Requests BOTTLES DRAWN AEROBIC AND ANAEROBIC 5CC  Final   Culture   Final    NO GROWTH 5 DAYS Performed at [facility]    Report Status 09/16/2015 FINAL  Final  Blood  Culture (routine x 2)     Status: None   Collection Time: 09/11/15  3:09 AM  Result Value Ref Range Status   Specimen Description BLOOD RIGHT HAND  Final   Special Requests BOTTLES DRAWN AEROBIC ONLY 5CC  Final   Culture   Final    NO GROWTH 5 DAYS Performed at [facility]    Report Status 09/16/2015 FINAL  Final  Culture, blood (Routine X 2) w Reflex to ID Panel     Status: None (Preliminary result)   Collection Time: 09/12/15  1:53 AM  Result Value Ref Range Status   Specimen Description BLOOD RIGHT ARM  Final   Special Requests BOTTLES DRAWN AEROBIC AND ANAEROBIC 5ML  Final   Culture NO GROWTH 4 DAYS  Final   Report Status PENDING  Incomplete  Culture, blood (Routine X 2) w Reflex to ID Panel     Status: None (Preliminary result)   Collection Time: 09/12/15  2:30 AM  Result  Value Ref Range Status   Specimen Description BLOOD LEFT ARM  Final   Special Requests BOTTLES DRAWN AEROBIC AND ANAEROBIC 5ML  Final   Culture NO GROWTH 4 DAYS  Final   Report Status PENDING  Incomplete  C difficile quick scan w PCR reflex     Status: None   Collection Time: 09/14/15 10:43 PM  Result Value Ref Range Status   C Diff antigen NEGATIVE NEGATIVE Final   C Diff toxin NEGATIVE NEGATIVE Final   C Diff interpretation Negative for toxigenic C. difficile  Final         Radiology Studies: Dg Chest 2 View  09/15/2015  CLINICAL DATA:  SOB (shortness of breath) [id number], wheezing, dry cough, fever, x 2 days EXAM: CHEST  2 VIEW COMPARISON:  None. FINDINGS: Bilateral patchy airspace disease in the upper and lower lobes most consistent with multi lobar pneumonia. There is no pleural effusion or pneumothorax. The heart and mediastinal contours are unremarkable. The osseous structures are unremarkable. IMPRESSION: Bilateral patchy airspace disease in the upper and lower lobes most consistent with multi lobar pneumonia. Electronically Signed   By: Kathreen Devoid   On: 09/15/2015 14:21        Scheduled Meds: . arformoterol  15 mcg Nebulization BID  . aspirin EC  81 mg Oral Daily  . azithromycin  500 mg Oral Daily  . budesonide (PULMICORT) nebulizer solution  0.25 mg Nebulization BID  . cefTRIAXone (ROCEPHIN)  IV  1 g Intravenous Q24H  . enoxaparin (LOVENOX) injection  40 mg Subcutaneous Daily  . fluorometholone  1 drop Right Eye BID  . fluticasone  2 spray Each Nare Daily  . guaiFENesin  1,200 mg Oral BID  . HYDROcodone-homatropine  5 mL Oral Q12H  . insulin aspart  0-15 Units Subcutaneous TID WC  . insulin aspart  0-5 Units Subcutaneous QHS  . insulin detemir  40 Units Subcutaneous QPM  . ipratropium-albuterol  3 mL Nebulization Q6H  . irbesartan  75 mg Oral Daily  . levothyroxine  150 mcg Oral QAC breakfast  . loratadine  10 mg Oral Daily  . methylPREDNISolone (SOLU-MEDROL)  injection  60 mg Intravenous Q12H  . metoprolol succinate  25 mg Oral BID  . montelukast  10 mg Oral QHS  . pantoprazole  40 mg Oral Daily  . pregabalin  150 mg Oral TID  . saccharomyces boulardii  250 mg Oral BID  . simvastatin  20 mg Oral QHS   Continuous Infusions:  LOS: 4 days    Time spent: 46 minutes    THOMPSON,DANIEL, MD [facility] Pager [phone]  If 7PM-7AM, please contact night-coverage [email or web address] Password [email or web address] 09/16/2015, 8:26 PM

## 2015-09-16 NOTE — Progress Notes (Addendum)
CBG 432. Dr. Grandville Silos paged and verbal order to give 19 units of novolog now.

## 2015-09-17 LAB — CULTURE, BLOOD (ROUTINE X 2)
CULTURE: NO GROWTH
Culture: NO GROWTH

## 2015-09-17 LAB — GLUCOSE, CAPILLARY
GLUCOSE-CAPILLARY: 215 mg/dL — AB (ref 65–99)
Glucose-Capillary: 251 mg/dL — ABNORMAL HIGH (ref 65–99)
Glucose-Capillary: 319 mg/dL — ABNORMAL HIGH (ref 65–99)
Glucose-Capillary: 225 mg/dL — ABNORMAL HIGH (ref 65–99)

## 2015-09-17 LAB — BASIC METABOLIC PANEL
Anion gap: 10 (ref 5–15)
BUN: 25 mg/dL — AB (ref 6–20)
CHLORIDE: 108 mmol/L (ref 101–111)
CO2: 23 mmol/L (ref 22–32)
Calcium: 8.9 mg/dL (ref 8.9–10.3)
Creatinine, Ser: 1.03 mg/dL — ABNORMAL HIGH (ref 0.44–1.00)
GFR calc Af Amer: >60 mL/min (ref >60)
GFR, EST NON AFRICAN AMERICAN: 60 mL/min — AB (ref >60)
GLUCOSE: 279 mg/dL — AB (ref 65–99)
POTASSIUM: 4.4 mmol/L (ref 3.5–5.1)
Sodium: 141 mmol/L (ref 135–145)

## 2015-09-17 LAB — CBC
HCT: 28.4 % — ABNORMAL LOW (ref 36.0–46.0)
Hemoglobin: 9.3 g/dL — ABNORMAL LOW (ref 12.0–15.0)
MCH: 25.5 pg — AB (ref 26.0–34.0)
MCHC: 32.7 g/dL (ref 30.0–36.0)
MCV: 77.8 fL — AB (ref 78.0–100.0)
PLATELETS: 184 10*3/uL (ref 150–400)
RBC: 3.65 MIL/uL — AB (ref 3.87–5.11)
RDW: 14.5 % (ref 11.5–15.5)
WBC: 17.3 10*3/uL — ABNORMAL HIGH (ref 4.0–10.5)

## 2015-09-17 LAB — LEGIONELLA PNEUMOPHILA SEROGP 1 UR AG: L. pneumophila Serogp 1 Ur Ag: NEGATIVE

## 2015-09-17 MED ORDER — INSULIN ASPART 100 UNIT/ML ~~LOC~~ SOLN
8.0000 [IU] | Freq: Three times a day (TID) | SUBCUTANEOUS | Status: DC
  Administered 2015-09-17 – 2015-09-21 (×12): 8 [IU] via SUBCUTANEOUS

## 2015-09-17 MED ORDER — INSULIN DETEMIR 100 UNIT/ML ~~LOC~~ SOLN
45.0000 [IU] | Freq: Every evening | SUBCUTANEOUS | Status: DC
  Administered 2015-09-17 – 2015-09-20 (×3): 45 [IU] via SUBCUTANEOUS
  Filled 2015-09-17 (×6): qty 0.45

## 2015-09-17 NOTE — Progress Notes (Signed)
Inpatient Diabetes Program Recommendations  AACE/ADA: New Consensus Statement on Inpatient Glycemic Control (2015)  Target Ranges:  Prepandial:   less than 140 mg/dL      Peak postprandial:   less than 180 mg/dL (1-2 hours)      Critically ill patients:  140 - 180 mg/dL   Review of Glycemic Control: Results for TANEE, MALINSKY (MRN [id number]) as of 09/17/2015 13:36  Ref. Range 09/16/2015 12:15 09/16/2015 17:16 09/16/2015 22:46 09/17/2015 08:07 09/17/2015 11:52  Glucose-Capillary Latest Ref Range: 65-99 mg/dL 352 (H) 432 (H) 305 (H) 251 (H) 319 (H)     Diabetes history: Type 2 diabetes Outpatient Diabetes medications: Humalog 8 units AM, 10 units with lunch and 14 units with dinner, Levemir 30 units q AM Current orders for Inpatient glycemic control:  Levemir 45 units q AM, Novolog moderate tid with meals and HS, Solumedrol 60 mg IV q 12 hours  Inpatient Diabetes Program Recommendations:   Please consider adding Novolog 8 units tid with meals for meal coverage (hold if patient eats less than 50%).  Thanks, Adah Perl, RN, BC-ADM Inpatient Diabetes Coordinator Pager [phone] (8a-5p)

## 2015-09-17 NOTE — Evaluation (Signed)
Physical Therapy Evaluation Patient Details Name: Amy [name] MRN: [name] DOB: 10-06-1960 Today's Date: 09/17/2015   History of Present Illness  Amy [name] is a 55 y.o. female with medical history significant of diabetes mellitus with complications of neuropathy, remote tobacco abuse, legally blind, hypothyroidism, HTN, HLD, asthma; who presents with complaints of cough and shortness of breath. Symptoms initially started 3 days ago with generalized aches, productive cough, nausea, vomiting, diarrhea. Pt diagnosed with CAP.  Clinical Impression  Pt admitted with above diagnosis. Pt currently with functional limitations due to the deficits listed below (see PT Problem List). Pt will benefit from skilled PT to increase their independence and safety with mobility to allow discharge to the venue listed below.  Pt very fatigued and wheezy with gait. Pt lives alone and has limited support. Recommend SNF for short term rehab and pt is agreeable to short term only.       Follow Up Recommendations SNF;Supervision for mobility/OOB    Equipment Recommendations  None recommended by PT    Recommendations for Other Services       Precautions / Restrictions Precautions Precautions: Fall Precaution Comments: BLIND Restrictions Weight Bearing Restrictions: No      Mobility  Bed Mobility               General bed mobility comments: sitting in recliner upon arrival  Transfers Overall transfer level: Needs assistance Equipment used: Straight cane (red tipped blind cane) Transfers: Sit to/from Stand Sit to Stand: Min assist         General transfer comment: MIN A to power up and to steady self.  Ambulation/Gait Ambulation/Gait assistance: Min assist Ambulation Distance (Feet): 70 Feet Assistive device: Straight cane (red tipped blind cane) Gait Pattern/deviations: Decreased step length - right;Decreased step length - left;Step-through pattern     General Gait Details:  Pt needed cues due to blindness, but does respond well to directions and uses her cane well.  Pt with 3/4 dyspnea/wheezing at end of gait and extremely fatigued. o2 sat 91% on RA.  Stairs            Wheelchair Mobility    Modified Rankin (Stroke Patients Only)       Balance Overall balance assessment: Needs assistance Sitting-balance support: Feet supported Sitting balance-Leahy Scale: Good     Standing balance support: Single extremity supported Standing balance-Leahy Scale: Fair Standing balance comment: slightly unsteady                             Pertinent Vitals/Pain Pain Assessment: Faces Faces Pain Scale: Hurts a little bit Pain Location: hx of chronic pain all over Pain Intervention(s): Monitored during session    Home Living Family/patient expects to be discharged to:: Private residence Living Arrangements: Alone Available Help at Discharge: Personal care attendant;Family;Available PRN/intermittently (6x days/week for 6 hrs/day and on Sundays 2.5 hours) Type of Home: Apartment Home Access: Level entry     Home Layout: One level Home Equipment: Other (comment);Bedside commode;Walker - 4 wheels;Tub bench Additional Comments: blind cane; pt states the tub bench doesn't fit well    Prior Function Level of Independence: Independent with assistive device(s)               Hand Dominance   Dominant Hand: Right    Extremity/Trunk Assessment   Upper Extremity Assessment: Overall WFL for tasks assessed           Lower Extremity Assessment: Overall  WFL for tasks assessed;Generalized weakness         Communication   Communication: No difficulties  Cognition Arousal/Alertness: Awake/alert Behavior During Therapy: WFL for tasks assessed/performed Overall Cognitive Status: Within Functional Limits for tasks assessed                      General Comments      Exercises        Assessment/Plan    PT Assessment  Patient needs continued PT services  PT Diagnosis Difficulty walking;Generalized weakness   PT Problem List Decreased activity tolerance;Decreased balance;Decreased mobility;Cardiopulmonary status limiting activity  PT Treatment Interventions DME instruction;Gait training;Functional mobility training;Therapeutic activities;Therapeutic exercise;Balance training   PT Goals (Current goals can be found in the Care Plan section) Acute Rehab PT Goals Patient Stated Goal: Agreeable to short term SNF rehab PT Goal Formulation: With patient Time For Goal Achievement: 09/24/15 Potential to Achieve Goals: Good    Frequency Min 3X/week   Barriers to discharge Decreased caregiver support      Co-evaluation               End of Session Equipment Utilized During Treatment: Gait belt Activity Tolerance: Patient limited by fatigue Patient left: in chair;with call bell/phone within reach;with chair alarm set Nurse Communication: Mobility status         Time: [id number] PT Time Calculation (min) (ACUTE ONLY): 26 min   Charges:   PT Evaluation $PT Eval Moderate Complexity: 1 Procedure PT Treatments $Gait Training: 8-22 mins   PT G Codes:        Kylin Genna LUBECK 09/17/2015, 11:43 AM

## 2015-09-17 NOTE — NC FL2 (Signed)
[place] LEVEL OF CARE SCREENING TOOL     IDENTIFICATION  Patient Name: Amy [name] Birthdate: 08-Feb-1961 Sex: female Admission Date (Current Location): 09/12/2015  [facility] and [place] Number:  Herbalist and Address:  [facility] [place], [place] [address] [place], [place] [address]      Provider Number: [id number]  Attending Physician Name and Address:  Eugenie Filler, MD  Relative Name and Phone Number:  Loraine Maple [phone]    Current Level of Care: Hospital Recommended Level of Care: [place] Prior Approval Number:    Date Approved/Denied:   PASRR Number: [id number]  Discharge Plan: SNF    Current Diagnoses: Patient Active Problem List   Diagnosis Date Noted  . SOB (shortness of breath)   . CAP (community acquired pneumonia) 09/12/2015  . Hypoxia 09/12/2015  . Hypertension 07/05/2015  . Hypotension 07/05/2015  . Diabetes mellitus with neurological manifestations ([place]) 11/04/2014  . Hyperlipidemia LDL goal <70 11/04/2014  . Spinal stenosis, multilevel 11/04/2014  . Hyperkalemia 11/01/2014  . Hypoglycemia 08/09/2014  . DM (diabetes mellitus), type 2, uncontrolled, with hyperosmolarity ([place]) 08/08/2014  . Elevated troponin 08/08/2014  . Nausea vomiting and diarrhea 08/08/2014  . Dizziness 08/06/2012  . Orthostatic hypotension 08/06/2012  . Hypernatremia 08/06/2012  . Acute gastroenteritis 08/13/2011  . Gastroparesis 08/13/2011  . Chest pain 08/13/2011  . DM (diabetes mellitus) ([place]) 08/13/2011  . Oral thrush 08/13/2011  . Gastroenteritis 05/23/2011  . Hypotension 05/23/2011  . Dehydration 05/23/2011  . Blindness 05/23/2011  . DM type 1, not at goal, causing eye disease ([place]) 05/23/2011  . Asthma 05/23/2011  . Diarrhea 05/23/2011  . Vomiting 05/23/2011  . Hypothyroidism 05/23/2011  . Diabetic neuropathy ([place]) 05/23/2011  . Diabetic nephropathy ([place]) 05/23/2011    Orientation  RESPIRATION BLADDER Height & Weight     Self, Time, Situation, Place  Normal Continent Weight: 188 lb 4.8 oz (85.412 kg) Height:  4\' 11"  (149.9 cm)  BEHAVIORAL SYMPTOMS/MOOD NEUROLOGICAL BOWEL NUTRITION STATUS      Incontinent Diet (Please see DC summary)  AMBULATORY STATUS COMMUNICATION OF NEEDS Skin   Limited Assist Verbally Normal                       Personal Care Assistance Level of Assistance  Bathing, Feeding, Dressing Bathing Assistance: Limited assistance Feeding assistance: Independent Dressing Assistance: Limited assistance     Functional Limitations Info  Sight Sight Info: Impaired (Patient is blind)        SPECIAL CARE FACTORS FREQUENCY  PT (By licensed PT)     PT Frequency: min 3x/week              Contractures      Additional Factors Info  Code Status, Allergies Code Status Info: Full Allergies Info: Codeine, Iodine-131, Iohexol, Penicillins, Sulfa Antibiotics           Current Medications (09/17/2015):  This is the current hospital active medication list Current Facility-Administered Medications  Medication Dose Route Frequency Provider Last Rate Last Dose  . acetaminophen (TYLENOL) tablet 650 mg  650 mg Oral Q6H PRN Domenic Polite, MD   650 mg at 09/13/15 0950  . arformoterol (BROVANA) nebulizer solution 15 mcg  15 mcg Nebulization BID Eugenie Filler, MD   15 mcg at 09/17/15 0817  . aspirin EC tablet 81 mg  81 mg Oral Daily Norval Morton, MD   81 mg at 09/17/15 0911  . azithromycin [facility])  tablet 500 mg  500 mg Oral Daily Jake Church Masters, RPH   500 mg at 09/17/15 1023  . benzonatate (TESSALON) capsule 200 mg  200 mg Oral BID PRN Norval Morton, MD   200 mg at 09/15/15 2336  . budesonide (PULMICORT) nebulizer solution 0.25 mg  0.25 mg Nebulization BID Eugenie Filler, MD   0.25 mg at 09/17/15 0816  . cefTRIAXone (ROCEPHIN) 1 g in dextrose 5 % 50 mL IVPB  1 g Intravenous Q24H Orpah Greek, MD 100 mL/hr at 09/17/15 0746 1  g at 09/17/15 0746  . clobetasol ointment (TEMOVATE) [id number] % 1 application  1 application Topical BID PRN Norval Morton, MD      . diphenoxylate-atropine (LOMOTIL) 2.5-0.025 MG per tablet 1 tablet  1 tablet Oral QID PRN Reyne Dumas, MD   1 tablet at 09/17/15 0911  . enoxaparin (LOVENOX) injection 40 mg  40 mg Subcutaneous Daily Norval Morton, MD   40 mg at 09/17/15 1023  . fluorometholone (FML FORTE) 0.25 % ophthalmic suspension 1 drop  1 drop Right Eye BID Norval Morton, MD   1 drop at 09/17/15 1022  . fluticasone (FLONASE) 50 MCG/ACT nasal spray 2 spray  2 spray Each Nare Daily Eugenie Filler, MD   2 spray at 09/17/15 1022  . guaiFENesin (MUCINEX) 12 hr tablet 1,200 mg  1,200 mg Oral BID Eugenie Filler, MD   1,200 mg at 09/17/15 0911  . HYDROcodone-homatropine (HYCODAN) 5-1.5 MG/5ML syrup 5 mL  5 mL Oral Q12H Reyne Dumas, MD   5 mL at 09/17/15 0749  . insulin aspart (novoLOG) injection 0-15 Units  0-15 Units Subcutaneous TID WC Norval Morton, MD   11 Units at 09/17/15 1239  . insulin aspart (novoLOG) injection 0-5 Units  0-5 Units Subcutaneous QHS Norval Morton, MD   4 Units at 09/16/15 2250  . insulin aspart (novoLOG) injection 8 Units  8 Units Subcutaneous TID WC Irine Seal V, MD      . insulin detemir (LEVEMIR) injection 45 Units  45 Units Subcutaneous QPM Irine Seal V, MD      . ipratropium-albuterol (DUONEB) 0.5-2.5 (3) MG/3ML nebulizer solution 3 mL  3 mL Nebulization Q2H PRN Norval Morton, MD   3 mL at 09/16/15 0821  . ipratropium-albuterol (DUONEB) 0.5-2.5 (3) MG/3ML nebulizer solution 3 mL  3 mL Nebulization Q6H Eugenie Filler, MD   3 mL at 09/17/15 1418  . irbesartan (AVAPRO) tablet 75 mg  75 mg Oral Daily Norval Morton, MD   75 mg at 09/17/15 1023  . levothyroxine (SYNTHROID, LEVOTHROID) tablet 150 mcg  150 mcg Oral QAC breakfast Norval Morton, MD   150 mcg at 09/17/15 0749  . linaclotide (LINZESS) capsule 145 mcg  145 mcg Oral Daily PRN Norval Morton, MD      . loratadine (CLARITIN) tablet 10 mg  10 mg Oral Daily Eugenie Filler, MD   10 mg at 09/17/15 0911  . methylPREDNISolone sodium succinate (SOLU-MEDROL) 125 mg/2 mL injection 60 mg  60 mg Intravenous Q12H Reyne Dumas, MD   60 mg at 09/17/15 1102  . metoprolol succinate (TOPROL-XL) 24 hr tablet 25 mg  25 mg Oral BID Norval Morton, MD   25 mg at 09/17/15 1023  . montelukast (SINGULAIR) tablet 10 mg  10 mg Oral QHS Norval Morton, MD   10 mg at 09/16/15 2144  . ondansetron (ZOFRAN) injection 4 mg  4 mg Intravenous Q6H PRN Reyne Dumas, MD   4 mg at 09/12/15 0834  . pantoprazole (PROTONIX) EC tablet 40 mg  40 mg Oral Daily Norval Morton, MD   40 mg at 09/17/15 0749  . pregabalin (LYRICA) capsule 150 mg  150 mg Oral TID Norval Morton, MD   150 mg at 09/17/15 1639  . saccharomyces boulardii (FLORASTOR) capsule 250 mg  250 mg Oral BID Reyne Dumas, MD   250 mg at 09/17/15 0911  . simvastatin (ZOCOR) tablet 20 mg  20 mg Oral QHS Norval Morton, MD   20 mg at 09/16/15 2145  . sodium chloride 0.9 % 1,000 mL infusion   Intravenous Continuous Eugenie Filler, MD 75 mL/hr at 09/16/15 2148    . sodium chloride flush (NS) 0.9 % injection 10-40 mL  10-40 mL Intracatheter PRN Reyne Dumas, MD   10 mL at 09/17/15 1134     Discharge Medications: Please see discharge summary for a list of discharge medications.  Relevant Imaging Results:  Relevant Lab Results:   Additional Information SSN: [place], [place]

## 2015-09-17 NOTE — Progress Notes (Signed)
PROGRESS NOTE    Amy [name]  [id number] DOB: [birth year] DOA: 09/12/2015 PCP: Velna Hatchet, MD  Outpatient Specialists:     Brief Narrative:  Amy [name] is a 55 y.o. female with medical history significant of diabetes mellitus with complications of neuropathy, remote tobacco abuse, legally blind, hypothyroidism, HTN, HLD, asthma; who presents with complaints of cough and shortness of breath. Symptoms initially started 3 days ago with generalized aches, productive cough, nausea, vomiting, diarrhea. The following day she was was seen at the emergency department at [facility] and diagnosed with bronchopneumonia on x-ray. She was given prescription for cefuroxime and doxycycline, but did not take these medications once home as she has an allergy to penicillins. Although legally blind at baseline patient lives at home alone.   Assessment & Plan:   Principal Problem:   CAP (community acquired pneumonia) Active Problems:   Hypothyroidism   Nausea vomiting and diarrhea   Diabetes mellitus with neurological manifestations ([place])   Hypoxia   SOB (shortness of breath)  #1 multifocal community-acquired pneumonia with hypoxia Patient still with cough shortness of breath noted to be hypoxic on ambulation down to 82% on 09/15/2015. Chest x-ray showed bilateral Robert upper lobe pneumonia. Patient with intermittent fevers. CT chest done showed multifocal airspace disease without empyema. Blood cultures pending. Sputum Gram stain and culture pending. Check a urine Legionella antigen. Check a urine pneumococcus antigen. Change DuoNeb stool scheduled. Increased Mucinex to 1200 mg twice a day. Continue Pulmicort and Brovana, Claritin and Flonase. Continue IV steroids. Continue IV Rocephin and oral azithromycin. Follow.  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 CBGs currently ranging from 251 - 319. Likely in part due  to IV steroids. Increased Levemir to 42 units daily. Continue sliding scale insulin and monitor closely with steroid taper. Add meal coverage novolog 8 units TID with meals. Follow.  #3 hypothyroidism Continue Synthroid.  #4 diarrhea C. difficile negative. Continue Florastor.  #5 acute kidney injury Resolved.  #6 nausea vomiting diarrhea Improved. Follow.  #7 hypertension Stable. Continue Avapro, Toprol-XL.  #8 gastroesophageal reflux disease PPI.  #9 hyperlipidemia Continue statin.   DVT prophylaxis: Lovenox Code Status: Full Family Communication: Updated patient. No family at bedside. Disposition Plan: Home vs SNF when medically stable with clinical improvement and resolution of hypoxia.   Consultants:   None  Procedures:   CT chest 09/14/2015  Chest x-ray 09/12/2015, 09/15/2015  Antimicrobials:  Oral azithromycin 09/16/2015  IV Rocephin 09/12/2015  IV azithromycin 09/12/2015>>>> 09/15/2015   Subjective: Patient states feeling better. Cough improving. No chest pain.  Objective: Filed Vitals:   09/17/15 0818 09/17/15 1021 09/17/15 1135 09/17/15 1355  BP:  131/64  131/61  Pulse:  85  87  Temp:    98.4 F (36.9 C)  TempSrc:    Oral  Resp:    19  Height:      Weight:      SpO2: 97%  91% 97%    Intake/Output Summary (Last 24 hours) at 09/17/15 1450 Last data filed at 09/16/15 1840  Gross per 24 hour  Intake    240 ml  Output      0 ml  Net    240 ml   Filed Weights   09/12/15 0525  Weight: 85.412 kg (188 lb 4.8 oz)    Examination:  General exam: Appears calm and comfortable  Respiratory system: Coarse less diffuse rhonchorous breath sounds. Expiratory wheezing. Cardiovascular system:  S1 & S2 heard, RRR. No JVD, murmurs, rubs, gallops or clicks. No pedal edema. Gastrointestinal system: Abdomen is nondistended, soft and nontender. No organomegaly or masses felt. Normal bowel sounds heard. Central nervous system: Alert and oriented. No  focal neurological deficits. Extremities: Symmetric 5 x 5 power. Skin: No rashes, lesions or ulcers Psychiatry: Judgement and insight appear normal. Mood & affect appropriate.     Data Reviewed: I have personally reviewed following labs and imaging studies  CBC:  Recent Labs Lab 09/12/15 0130 09/13/15 0803 09/14/15 0744 09/16/15 0452 09/17/15 0410  WBC 7.7 8.7 6.3 13.9* 17.3*  NEUTROABS 5.7  --   --   --   --   HGB 10.8* 10.6* 10.5* 10.2* 9.3*  HCT 33.6* 32.9* 32.2* 31.9* 28.4*  MCV 81.2 81.2 79.7 78.4 77.8*  PLT 146* 133* 153 186 [id number]   Basic Metabolic Panel:  Recent Labs Lab 09/12/15 0130 09/13/15 0803 09/14/15 0744 09/16/15 0452 09/17/15 0410  NA 143 141 143 141 141  K 3.7 3.6 4.4 5.0 4.4  CL 108 106 109 107 108  CO2 25 27 24 24 23   GLUCOSE 307* 217* 320* 230* 279*  BUN 16 17 20  22* 25*  CREATININE 0.90 1.31* 0.98 1.07* 1.03*  CALCIUM 8.6* 8.4* 8.5* 8.9 8.9   GFR: Estimated Creatinine Clearance: 57.9 mL/min (by C-G formula based on Cr of 1.03). Liver Function Tests:  Recent Labs Lab 09/11/15 0211 09/14/15 0744 09/16/15 0452  AST 15 17 26   ALT 17 26 23   ALKPHOS 114 83 81  BILITOT 0.4 0.4 0.5  PROT 7.0 6.5 6.7  ALBUMIN 3.4* 2.5* 2.5*    Recent Labs Lab 09/11/15 0211  LIPASE 24   No results for input(s): AMMONIA in the last 168 hours. Coagulation Profile: No results for input(s): INR, PROTIME in the last 168 hours. Cardiac Enzymes:  Recent Labs Lab 09/15/15 1325 09/15/15 1924 09/16/15 0400  TROPONINI <0.03 <0.03 <0.03   BNP (last 3 results) No results for input(s): PROBNP in the last 8760 hours. HbA1C: No results for input(s): HGBA1C in the last 72 hours. CBG:  Recent Labs Lab 09/16/15 1215 09/16/15 1716 09/16/15 2246 09/17/15 0807 09/17/15 1152  GLUCAP 352* 432* 305* 251* 319*   Lipid Profile: No results for input(s): CHOL, HDL, LDLCALC, TRIG, CHOLHDL, LDLDIRECT in the last 72 hours. Thyroid Function Tests: No results for  input(s): TSH, T4TOTAL, FREET4, T3FREE, THYROIDAB in the last 72 hours. Anemia Panel: No results for input(s): VITAMINB12, FOLATE, FERRITIN, TIBC, IRON, RETICCTPCT in the last 72 hours. Urine analysis:    Component Value Date/Time   COLORURINE YELLOW 07/05/2015 1307   APPEARANCEUR CLOUDY* 07/05/2015 1307   LABSPEC 1.015 07/05/2015 1307   PHURINE 5.0 07/05/2015 1307   GLUCOSEU >1000* 07/05/2015 1307   HGBUR NEGATIVE 07/05/2015 1307   [place] 07/05/2015 1307   KETONESUR NEGATIVE 07/05/2015 1307   PROTEINUR NEGATIVE 07/05/2015 1307   UROBILINOGEN 0.2 01/16/2015 1121   NITRITE NEGATIVE 07/05/2015 1307   LEUKOCYTESUR NEGATIVE 07/05/2015 1307   Sepsis Labs: @LABRCNTIP (procalcitonin:4,lacticidven:4)  ) Recent Results (from the past 240 hour(s))  Blood Culture (routine x 2)     Status: None   Collection Time: 09/11/15  3:00 AM  Result Value Ref Range Status   Specimen Description BLOOD LEFT ANTECUBITAL  Final   Special Requests BOTTLES DRAWN AEROBIC AND ANAEROBIC 5CC  Final   Culture   Final    NO GROWTH 5 DAYS Performed at [facility]    Report Status  09/16/2015 FINAL  Final  Blood Culture (routine x 2)     Status: None   Collection Time: 09/11/15  3:09 AM  Result Value Ref Range Status   Specimen Description BLOOD RIGHT HAND  Final   Special Requests BOTTLES DRAWN AEROBIC ONLY 5CC  Final   Culture   Final    NO GROWTH 5 DAYS Performed at [facility]    Report Status 09/16/2015 FINAL  Final  Culture, blood (Routine X 2) w Reflex to ID Panel     Status: None   Collection Time: 09/12/15  1:53 AM  Result Value Ref Range Status   Specimen Description BLOOD RIGHT ARM  Final   Special Requests BOTTLES DRAWN AEROBIC AND ANAEROBIC 5ML  Final   Culture NO GROWTH 5 DAYS  Final   Report Status 09/17/2015 FINAL  Final  Culture, blood (Routine X 2) w Reflex to ID Panel     Status: None   Collection Time: 09/12/15  2:30 AM  Result Value Ref Range Status    Specimen Description BLOOD LEFT ARM  Final   Special Requests BOTTLES DRAWN AEROBIC AND ANAEROBIC 5ML  Final   Culture NO GROWTH 5 DAYS  Final   Report Status 09/17/2015 FINAL  Final  C difficile quick scan w PCR reflex     Status: None   Collection Time: 09/14/15 10:43 PM  Result Value Ref Range Status   C Diff antigen NEGATIVE NEGATIVE Final   C Diff toxin NEGATIVE NEGATIVE Final   C Diff interpretation Negative for toxigenic C. difficile  Final         Radiology Studies: No results found.      Scheduled Meds: . arformoterol  15 mcg Nebulization BID  . aspirin EC  81 mg Oral Daily  . azithromycin  500 mg Oral Daily  . budesonide (PULMICORT) nebulizer solution  0.25 mg Nebulization BID  . cefTRIAXone (ROCEPHIN)  IV  1 g Intravenous Q24H  . enoxaparin (LOVENOX) injection  40 mg Subcutaneous Daily  . fluorometholone  1 drop Right Eye BID  . fluticasone  2 spray Each Nare Daily  . guaiFENesin  1,200 mg Oral BID  . HYDROcodone-homatropine  5 mL Oral Q12H  . insulin aspart  0-15 Units Subcutaneous TID WC  . insulin aspart  0-5 Units Subcutaneous QHS  . insulin aspart  8 Units Subcutaneous TID WC  . insulin detemir  45 Units Subcutaneous QPM  . ipratropium-albuterol  3 mL Nebulization Q6H  . irbesartan  75 mg Oral Daily  . levothyroxine  150 mcg Oral QAC breakfast  . loratadine  10 mg Oral Daily  . methylPREDNISolone (SOLU-MEDROL) injection  60 mg Intravenous Q12H  . metoprolol succinate  25 mg Oral BID  . montelukast  10 mg Oral QHS  . pantoprazole  40 mg Oral Daily  . pregabalin  150 mg Oral TID  . saccharomyces boulardii  250 mg Oral BID  . simvastatin  20 mg Oral QHS   Continuous Infusions: . sodium chloride 0.9 % 1,000 mL infusion 75 mL/hr at 09/16/15 2148     LOS: 5 days    Time spent: 19 minutes    THOMPSON,DANIEL, MD [facility] Pager [phone]  If 7PM-7AM, please contact night-coverage [email or web address] Password [email or web address] 09/17/2015, 2:50 PM

## 2015-09-17 NOTE — Clinical Social Work Placement (Signed)
   CLINICAL SOCIAL WORK PLACEMENT  NOTE  Date:  09/17/2015  Patient Details  Name: Amy [name] MRN: [name] Date of Birth: [birth year]  Clinical Social Work is seeking post-discharge placement for this patient at the [place] level of care (*CSW will initial, date and re-position this form in  chart as items are completed):  Yes   Patient/family provided with [facility] Work Department's list of facilities offering this level of care within the geographic area requested by the patient (or if unable, by the patient's family).  Yes   Patient/family informed of their freedom to choose among providers that offer the needed level of care, that participate in Medicare, Medicaid or managed care program needed by the patient, have an available bed and are willing to accept the patient.  Yes   Patient/family informed of [facility]'s ownership interest in [facility] and [facility], as well as of the fact that they are under no obligation to receive care at these facilities.  PASRR submitted to EDS on       PASRR number received on       Existing PASRR number confirmed on 09/17/15     FL2 transmitted to all facilities in geographic area requested by pt/family on 09/17/15     FL2 transmitted to all facilities within larger geographic area on       Patient informed that his/her managed care company has contracts with or will negotiate with certain facilities, including the following:            Patient/family informed of bed offers received.  Patient chooses bed at       Physician recommends and patient chooses bed at      Patient to be transferred to   on  .  Patient to be transferred to facility by       Patient family notified on   of transfer.  Name of family member notified:        PHYSICIAN Please sign FL2     Additional Comment:    _______________________________________________ Benard Halsted, [place] 09/17/2015, 4:56 PM

## 2015-09-17 NOTE — Clinical Social Work Note (Signed)
Clinical Social Work Assessment  Patient Details  Name: Amy [name] MRN: [id number] Date of Birth: 1961/02/19  Date of referral:  09/17/15               Reason for consult:  Facility Placement                Permission sought to share information with:  Facility Sport and exercise psychologist, Family Supports Permission granted to share information::  Yes, Verbal Permission Granted  Name::     Amy [name]  Agency::  [facility] SNFs  Relationship::  Friend  Contact Information:  [phone]  Housing/Transportation Living arrangements for the past 2 months:  [place] of Information:  Patient Patient Interpreter Needed:  None Criminal Activity/Legal Involvement Pertinent to Current Situation/Hospitalization:  No - Comment as needed Significant Relationships:  Siblings, Friend (Sister) Lives with:  Self Do you feel safe going back to the place where you live?  No Need for family participation in patient care:  Yes (Comment) (Needs someone to sign paperwork at [facility])  Care giving concerns:  CSW received referral for possible SNF placement at time of discharge. CSW met with patient regarding PT recommendation of SNF placement at time of discharge. Patient is blind and reports not having sufficient care at home by herself. Patient expressed understanding of PT recommendation and is agreeable to SNF placement at time of discharge. CSW to continue to follow and assist with discharge planning needs.   Social Worker assessment / plan:  CSW spoke with patient concerning possibility of rehab at [facility] before returning home.  Employment status:  Disabled (Comment on whether or not currently receiving Disability) Insurance information:  Managed Medicare PT Recommendations:  [place] / Referral to community resources:  [place]  Patient/Family's Response to care:  Patient recognize need for rehab before returning home and is agreeable to a SNF  in [place]. Patient reported preference for [facility]. Patient states she would like to ask her friend Amy [name] to do her paperwork at [facility].  Patient/Family's Understanding of and Emotional Response to Diagnosis, Current Treatment, and Prognosis:  Patient is realistic regarding therapy needs. No questions/concerns about plan or treatment.    Emotional Assessment Appearance:  Appears stated age Attitude/Demeanor/Rapport:  Other (Appropriate) Affect (typically observed):  Accepting, Appropriate Orientation:  Oriented to Situation, Oriented to  Time, Oriented to Place, Oriented to Self Alcohol / Substance use:  Not Applicable Psych involvement (Current and /or in the community):  No (Comment)  Discharge Needs  Concerns to be addressed:  Care Coordination Readmission within the last 30 days:  No Current discharge risk:  None Barriers to Discharge:  Continued Medical Work up   [employer], [place] 09/17/2015, 4:52 PM

## 2015-09-18 LAB — BASIC METABOLIC PANEL
ANION GAP: 10 (ref 5–15)
BUN: 23 mg/dL — ABNORMAL HIGH (ref 6–20)
CHLORIDE: 107 mmol/L (ref 101–111)
CO2: 24 mmol/L (ref 22–32)
Calcium: 8.8 mg/dL — ABNORMAL LOW (ref 8.9–10.3)
Creatinine, Ser: 0.93 mg/dL (ref 0.44–1.00)
GFR calc Af Amer: >60 mL/min (ref >60)
GLUCOSE: 263 mg/dL — AB (ref 65–99)
POTASSIUM: 4.8 mmol/L (ref 3.5–5.1)
SODIUM: 141 mmol/L (ref 135–145)

## 2015-09-18 LAB — CBC WITH DIFFERENTIAL/PLATELET
BASOS ABS: 0 10*3/uL (ref 0.0–0.1)
Basophils Relative: 0 %
Eosinophils Absolute: 0 10*3/uL (ref 0.0–0.7)
Eosinophils Relative: 0 %
HEMATOCRIT: 30.1 % — AB (ref 36.0–46.0)
Hemoglobin: 9.8 g/dL — ABNORMAL LOW (ref 12.0–15.0)
LYMPHS ABS: 1 10*3/uL (ref 0.7–4.0)
LYMPHS PCT: 6 %
MCH: 26.1 pg (ref 26.0–34.0)
MCHC: 32.6 g/dL (ref 30.0–36.0)
MCV: 80.1 fL (ref 78.0–100.0)
Monocytes Absolute: 0.8 10*3/uL (ref 0.1–1.0)
Monocytes Relative: 5 %
NEUTROS ABS: 15 10*3/uL — AB (ref 1.7–7.7)
Neutrophils Relative %: 89 %
Platelets: 218 10*3/uL (ref 150–400)
RBC: 3.76 MIL/uL — ABNORMAL LOW (ref 3.87–5.11)
RDW: 14.8 % (ref 11.5–15.5)
WBC: 16.9 10*3/uL — AB (ref 4.0–10.5)

## 2015-09-18 LAB — GLUCOSE, CAPILLARY
GLUCOSE-CAPILLARY: 201 mg/dL — AB (ref 65–99)
GLUCOSE-CAPILLARY: 252 mg/dL — AB (ref 65–99)
Glucose-Capillary: 238 mg/dL — ABNORMAL HIGH (ref 65–99)
Glucose-Capillary: 208 mg/dL — ABNORMAL HIGH (ref 65–99)

## 2015-09-18 MED ORDER — METHYLPREDNISOLONE SODIUM SUCC 125 MG IJ SOLR
60.0000 mg | Freq: Every day | INTRAMUSCULAR | Status: DC
  Administered 2015-09-18: 60 mg via INTRAVENOUS
  Filled 2015-09-18: qty 2

## 2015-09-18 MED ORDER — LEVOFLOXACIN 750 MG PO TABS
750.0000 mg | ORAL_TABLET | Freq: Every day | ORAL | Status: DC
  Administered 2015-09-18 – 2015-09-21 (×4): 750 mg via ORAL
  Filled 2015-09-18 (×4): qty 1

## 2015-09-18 NOTE — Progress Notes (Signed)
PROGRESS NOTE    [name]  [id number] DOB: Oct 30, 1960 DOA: 09/12/2015 PCP: Velna Hatchet, MD  Outpatient Specialists:     Brief Narrative:  Amy [name] is a 55 y.o. female with medical history significant of diabetes mellitus with complications of neuropathy, remote tobacco abuse, legally blind, hypothyroidism, HTN, HLD, asthma; who presents with complaints of cough and shortness of breath. Symptoms initially started 3 days ago with generalized aches, productive cough, nausea, vomiting, diarrhea. The following day she was was seen at the emergency department at [facility] and diagnosed with bronchopneumonia on x-ray. She was given prescription for cefuroxime and doxycycline, but did not take these medications once home as she has an allergy to penicillins. Although legally blind at baseline patient lives at home alone.   Assessment & Plan:   Principal Problem:   CAP (community acquired pneumonia) Active Problems:   Hypothyroidism   Nausea vomiting and diarrhea   Diabetes mellitus with neurological manifestations ([place])   Hypoxia   SOB (shortness of breath)  #1 multifocal community-acquired pneumonia with hypoxia Patient still with cough shortness of breath noted to be hypoxic on ambulation down to 82% on 09/15/2015. Chest x-ray showed bilateral Robert upper lobe pneumonia. Patient with intermittent fevers. CT chest done showed multifocal airspace disease without empyema. Blood cultures pending. Sputum Gram stain and culture pending. Urine Legionella antigen negative. Urine pneumococcus antigen negative. Continue scheduled nebulizers. Continue Mucinex. Continue Pulmicort and Brovana, Claritin and Flonase. Taper IV steroids. Discontinue Rocephin and azithromycin in place patient on oral Levaquin to complete course of antibiotic treatment. Supportive care.   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  CBGs currently ranging from 201 - 238. Likely in part due to IV steroids. Increased Levemir to 42 units daily. Continue sliding scale insulin and monitor closely with steroid taper. Continue meal coverage novolog 8 units TID with meals. Follow.  #3 hypothyroidism Continue Synthroid.  #4 diarrhea C. difficile negative. Continue Florastor.  #5 acute kidney injury Resolved.  #6 nausea vomiting diarrhea Improved. Follow.  #7 hypertension Stable. Continue Avapro, Toprol-XL.  #8 gastroesophageal reflux disease PPI.  #9 hyperlipidemia Continue statin.   DVT prophylaxis: Lovenox Code Status: Full Family Communication: Updated patient. No family at bedside. Disposition Plan: Home vs SNF when medically stable with clinical improvement and resolution of hypoxia, hopefully in 1-2 days.   Consultants:   None  Procedures:   CT chest 09/14/2015  Chest x-ray 09/12/2015, 09/15/2015  Antimicrobials:  Oral azithromycin 09/16/2015>>>>. 09/18/2015  IV Rocephin 09/12/2015>>>>> 09/18/2015  IV azithromycin 09/12/2015>>>> 09/15/2015  Oral Levaquin 09/18/2015   Subjective: Patient states feeling better. Cough improving. No chest pain. Shortness of breath improved.  Objective: Filed Vitals:   09/17/15 2125 09/17/15 2149 09/18/15 0513 09/18/15 1002  BP: 144/71  158/79   Pulse: 76  82 85  Temp: 98.4 F (36.9 C)  99 F (37.2 C)   TempSrc: Oral  Oral   Resp:   18 20  Height:      Weight:      SpO2: 97% 98% 99% 92%    Intake/Output Summary (Last 24 hours) at 09/18/15 1213 Last data filed at 09/18/15 [id number]  Gross per 24 hour  Intake    240 ml  Output      0 ml  Net    240 ml   Filed Weights   09/12/15 0525  Weight: 85.412 kg (188 lb 4.8 oz)    Examination:  General exam: Appears calm and comfortable  Respiratory system: Less diffuse rhonchorous breath sounds. Min- mild Expiratory wheezing. Cardiovascular system: S1 & S2 heard, RRR. No JVD, murmurs, rubs, gallops or  clicks. No pedal edema. Gastrointestinal system: Abdomen is nondistended, soft and nontender. No organomegaly or masses felt. Normal bowel sounds heard. Central nervous system: Alert and oriented. No focal neurological deficits. Extremities: Symmetric 5 x 5 power. Skin: No rashes, lesions or ulcers Psychiatry: Judgement and insight appear normal. Mood & affect appropriate.     Data Reviewed: I have personally reviewed following labs and imaging studies  CBC:  Recent Labs Lab 09/12/15 0130 09/13/15 0803 09/14/15 0744 09/16/15 0452 09/17/15 0410 09/18/15 0710  WBC 7.7 8.7 6.3 13.9* 17.3* 16.9*  NEUTROABS 5.7  --   --   --   --  15.0*  HGB 10.8* 10.6* 10.5* 10.2* 9.3* 9.8*  HCT 33.6* 32.9* 32.2* 31.9* 28.4* 30.1*  MCV 81.2 81.2 79.7 78.4 77.8* 80.1  PLT 146* 133* 153 186 184 [id number]   Basic Metabolic Panel:  Recent Labs Lab 09/13/15 0803 09/14/15 0744 09/16/15 0452 09/17/15 0410 09/18/15 0710  NA 141 143 141 141 141  K 3.6 4.4 5.0 4.4 4.8  CL 106 109 107 108 107  CO2 27 24 24 23 24   GLUCOSE 217* 320* 230* 279* 263*  BUN 17 20 22* 25* 23*  CREATININE 1.31* 0.98 1.07* 1.03* 0.93  CALCIUM 8.4* 8.5* 8.9 8.9 8.8*   GFR: Estimated Creatinine Clearance: 64.1 mL/min (by C-G formula based on Cr of 0.93). Liver Function Tests:  Recent Labs Lab 09/14/15 0744 09/16/15 0452  AST 17 26  ALT 26 23  ALKPHOS 83 81  BILITOT 0.4 0.5  PROT 6.5 6.7  ALBUMIN 2.5* 2.5*   No results for input(s): LIPASE, AMYLASE in the last 168 hours. No results for input(s): AMMONIA in the last 168 hours. Coagulation Profile: No results for input(s): INR, PROTIME in the last 168 hours. Cardiac Enzymes:  Recent Labs Lab 09/15/15 1325 09/15/15 1924 09/16/15 0400  TROPONINI <0.03 <0.03 <0.03   BNP (last 3 results) No results for input(s): PROBNP in the last 8760 hours. HbA1C: No results for input(s): HGBA1C in the last 72 hours. CBG:  Recent Labs Lab 09/17/15 0807 09/17/15 1152  09/17/15 1712 09/17/15 2123 09/18/15 0755  GLUCAP 251* 319* 225* 215* 238*   Lipid Profile: No results for input(s): CHOL, HDL, LDLCALC, TRIG, CHOLHDL, LDLDIRECT in the last 72 hours. Thyroid Function Tests: No results for input(s): TSH, T4TOTAL, FREET4, T3FREE, THYROIDAB in the last 72 hours. Anemia Panel: No results for input(s): VITAMINB12, FOLATE, FERRITIN, TIBC, IRON, RETICCTPCT in the last 72 hours. Urine analysis:    Component Value Date/Time   COLORURINE YELLOW 07/05/2015 1307   APPEARANCEUR CLOUDY* 07/05/2015 1307   LABSPEC 1.015 07/05/2015 1307   PHURINE 5.0 07/05/2015 1307   GLUCOSEU >1000* 07/05/2015 1307   HGBUR NEGATIVE 07/05/2015 1307   [place] 07/05/2015 1307   KETONESUR NEGATIVE 07/05/2015 1307   PROTEINUR NEGATIVE 07/05/2015 1307   UROBILINOGEN 0.2 01/16/2015 1121   NITRITE NEGATIVE 07/05/2015 1307   LEUKOCYTESUR NEGATIVE 07/05/2015 1307   Sepsis Labs: @LABRCNTIP (procalcitonin:4,lacticidven:4)  ) Recent Results (from the past 240 hour(s))  Blood Culture (routine x 2)     Status: None   Collection Time: 09/11/15  3:00 AM  Result Value Ref Range Status   Specimen Description BLOOD LEFT ANTECUBITAL  Final   Special Requests BOTTLES DRAWN AEROBIC AND ANAEROBIC 5CC  Final   Culture  Final    NO GROWTH 5 DAYS Performed at [facility]    Report Status 09/16/2015 FINAL  Final  Blood Culture (routine x 2)     Status: None   Collection Time: 09/11/15  3:09 AM  Result Value Ref Range Status   Specimen Description BLOOD RIGHT HAND  Final   Special Requests BOTTLES DRAWN AEROBIC ONLY 5CC  Final   Culture   Final    NO GROWTH 5 DAYS Performed at [facility]    Report Status 09/16/2015 FINAL  Final  Culture, blood (Routine X 2) w Reflex to ID Panel     Status: None   Collection Time: 09/12/15  1:53 AM  Result Value Ref Range Status   Specimen Description BLOOD RIGHT ARM  Final   Special Requests BOTTLES DRAWN AEROBIC AND  ANAEROBIC 5ML  Final   Culture NO GROWTH 5 DAYS  Final   Report Status 09/17/2015 FINAL  Final  Culture, blood (Routine X 2) w Reflex to ID Panel     Status: None   Collection Time: 09/12/15  2:30 AM  Result Value Ref Range Status   Specimen Description BLOOD LEFT ARM  Final   Special Requests BOTTLES DRAWN AEROBIC AND ANAEROBIC 5ML  Final   Culture NO GROWTH 5 DAYS  Final   Report Status 09/17/2015 FINAL  Final  C difficile quick scan w PCR reflex     Status: None   Collection Time: 09/14/15 10:43 PM  Result Value Ref Range Status   C Diff antigen NEGATIVE NEGATIVE Final   C Diff toxin NEGATIVE NEGATIVE Final   C Diff interpretation Negative for toxigenic C. difficile  Final         Radiology Studies: No results found.      Scheduled Meds: . arformoterol  15 mcg Nebulization BID  . aspirin EC  81 mg Oral Daily  . budesonide (PULMICORT) nebulizer solution  0.25 mg Nebulization BID  . enoxaparin (LOVENOX) injection  40 mg Subcutaneous Daily  . fluorometholone  1 drop Right Eye BID  . fluticasone  2 spray Each Nare Daily  . guaiFENesin  1,200 mg Oral BID  . HYDROcodone-homatropine  5 mL Oral Q12H  . insulin aspart  0-15 Units Subcutaneous TID WC  . insulin aspart  0-5 Units Subcutaneous QHS  . insulin aspart  8 Units Subcutaneous TID WC  . insulin detemir  45 Units Subcutaneous QPM  . ipratropium-albuterol  3 mL Nebulization Q6H  . irbesartan  75 mg Oral Daily  . levofloxacin  750 mg Oral Daily  . levothyroxine  150 mcg Oral QAC breakfast  . loratadine  10 mg Oral Daily  . methylPREDNISolone (SOLU-MEDROL) injection  60 mg Intravenous Daily  . metoprolol succinate  25 mg Oral BID  . montelukast  10 mg Oral QHS  . pantoprazole  40 mg Oral Daily  . pregabalin  150 mg Oral TID  . saccharomyces boulardii  250 mg Oral BID  . simvastatin  20 mg Oral QHS   Continuous Infusions:     LOS: 6 days    Time spent: 35 minutes    Tamora Huneke, MD [facility] Pager [phone]  If 7PM-7AM, please contact night-coverage [email or web address] Password [email or web address] 09/18/2015, 12:13 PM

## 2015-09-18 NOTE — Care Management Important Message (Signed)
Important Message  Patient Details  Name: Amy [name] MRN: [id number] Date of Birth: [birth year]   Medicare Important Message Given:  Yes    [name] Abena 09/18/2015, 3:20 PM

## 2015-09-19 LAB — BASIC METABOLIC PANEL
Anion gap: 9 (ref 5–15)
BUN: 26 mg/dL — AB (ref 6–20)
CO2: 27 mmol/L (ref 22–32)
CREATININE: 1 mg/dL (ref 0.44–1.00)
Calcium: 9 mg/dL (ref 8.9–10.3)
Chloride: 106 mmol/L (ref 101–111)
GFR calc Af Amer: >60 mL/min (ref >60)
GFR calc non Af Amer: >60 mL/min (ref >60)
Glucose, Bld: 276 mg/dL — ABNORMAL HIGH (ref 65–99)
POTASSIUM: 4.4 mmol/L (ref 3.5–5.1)
Sodium: 142 mmol/L (ref 135–145)

## 2015-09-19 LAB — GLUCOSE, CAPILLARY
GLUCOSE-CAPILLARY: 243 mg/dL — AB (ref 65–99)
GLUCOSE-CAPILLARY: 108 mg/dL — AB (ref 65–99)
GLUCOSE-CAPILLARY: 202 mg/dL — AB (ref 65–99)
Glucose-Capillary: 159 mg/dL — ABNORMAL HIGH (ref 65–99)

## 2015-09-19 LAB — CBC
HEMATOCRIT: 29.1 % — AB (ref 36.0–46.0)
HEMOGLOBIN: 9.3 g/dL — AB (ref 12.0–15.0)
MCH: 25.2 pg — AB (ref 26.0–34.0)
MCHC: 32 g/dL (ref 30.0–36.0)
MCV: 78.9 fL (ref 78.0–100.0)
Platelets: 242 10*3/uL (ref 150–400)
RBC: 3.69 MIL/uL — AB (ref 3.87–5.11)
RDW: 14.6 % (ref 11.5–15.5)
WBC: 16.7 10*3/uL — AB (ref 4.0–10.5)

## 2015-09-19 MED ORDER — IPRATROPIUM-ALBUTEROL 0.5-2.5 (3) MG/3ML IN SOLN
3.0000 mL | Freq: Three times a day (TID) | RESPIRATORY_TRACT | Status: DC
  Administered 2015-09-20 – 2015-09-21 (×5): 3 mL via RESPIRATORY_TRACT
  Filled 2015-09-19 (×5): qty 3

## 2015-09-19 MED ORDER — FUROSEMIDE 20 MG PO TABS
20.0000 mg | ORAL_TABLET | Freq: Every day | ORAL | Status: DC
  Administered 2015-09-19 – 2015-09-21 (×3): 20 mg via ORAL
  Filled 2015-09-19 (×3): qty 1

## 2015-09-19 MED ORDER — PREDNISONE 50 MG PO TABS
60.0000 mg | ORAL_TABLET | Freq: Every day | ORAL | Status: AC
  Administered 2015-09-19 – 2015-09-21 (×3): 60 mg via ORAL
  Filled 2015-09-19 (×3): qty 1

## 2015-09-19 NOTE — Progress Notes (Signed)
PROGRESS NOTE    Amy [name]  [id number] DOB: [birth year] DOA: 09/12/2015 PCP: Velna Hatchet, MD  Outpatient Specialists:     Brief Narrative:  Amy [name] is a 55 y.o. female with medical history significant of diabetes mellitus with complications of neuropathy, remote tobacco abuse, legally blind, hypothyroidism, HTN, HLD, asthma; who presents with complaints of cough and shortness of breath. Symptoms initially started 3 days ago with generalized aches, productive cough, nausea, vomiting, diarrhea. The following day she was was seen at the emergency department at [facility] and diagnosed with bronchopneumonia on x-ray. She was given prescription for cefuroxime and doxycycline, but did not take these medications once home as she has an allergy to penicillins. Although legally blind at baseline patient lives at home alone.   Assessment & Plan:   Principal Problem:   CAP (community acquired pneumonia) Active Problems:   Hypothyroidism   Nausea vomiting and diarrhea   Diabetes mellitus with neurological manifestations ([place])   Hypoxia   SOB (shortness of breath)  #1 multifocal community-acquired pneumonia with hypoxia Patient still with cough shortness of breath noted to be hypoxic on ambulation down to 82% on 09/15/2015. Chest x-ray showed bilateral upper lobe pneumonia. Patient with intermittent fevers. CT chest done showed multifocal airspace disease without empyema. Blood cultures pending. Sputum Gram stain and culture pending. Urine Legionella antigen negative. Urine pneumococcus antigen negative. Continue scheduled nebulizers. Continue Mucinex. Continue Pulmicort and Brovana, Claritin and Flonase. Change IV steroids to oral steroid taper. Discontinued Rocephin and azithromycin. Continue oral Levaquin to complete course of antibiotic treatment. Supportive care.   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 CBGs currently ranging from 108 - 252. Likely in part due to IV steroids. Increased Levemir to 42 units daily. Continue sliding scale insulin and monitor closely with steroid taper. Continue meal coverage novolog 8 units TID with meals. Follow.  #3 hypothyroidism Continue Synthroid.  #4 diarrhea C. difficile negative. Continue Florastor.  #5 acute kidney injury Resolved.  #6 nausea vomiting diarrhea Improved. Follow.  #7 hypertension Stable. Continue Avapro, Toprol-XL.  #8 gastroesophageal reflux disease PPI.  #9 hyperlipidemia Continue statin.   DVT prophylaxis: Lovenox Code Status: Full Family Communication: Updated patient. No family at bedside. Disposition Plan: Home vs SNF when medically stable with clinical improvement and resolution of hypoxia, hopefully tomorrow.    Consultants:   None  Procedures:   CT chest 09/14/2015  Chest x-ray 09/12/2015, 09/15/2015  Antimicrobials:  Oral azithromycin 09/16/2015>>>>. 09/18/2015  IV Rocephin 09/12/2015>>>>> 09/18/2015  IV azithromycin 09/12/2015>>>> 09/15/2015  Oral Levaquin 09/18/2015   Subjective: Patient states feeling better. Cough improving. No chest pain. Shortness of breath improved. Patient complaining of knee swelling and wants to be placed back on her diuretic.  Objective: Filed Vitals:   09/19/15 0731 09/19/15 0847 09/19/15 1427 09/19/15 1429  BP: 150/76  96/49   Pulse: 78  81   Temp:   98.3 F (36.8 C)   TempSrc:   Oral   Resp:   19   Height:      Weight:      SpO2:  97% 97% 95%    Intake/Output Summary (Last 24 hours) at 09/19/15 1523 Last data filed at 09/19/15 0946  Gross per 24 hour  Intake    130 ml  Output    650 ml  Net   -520 ml   Filed Weights   09/12/15 0525  Weight: 85.412 kg (188  lb 4.8 oz)    Examination:  General exam: Appears calm and comfortable  Respiratory system: Less diffuse rhonchorous breath sounds. Min Expiratory wheezing. Cardiovascular  system: S1 & S2 heard, RRR. No JVD, murmurs, rubs, gallops or clicks. No pedal edema. Gastrointestinal system: Abdomen is nondistended, soft and nontender. No organomegaly or masses felt. Normal bowel sounds heard. Central nervous system: Alert and oriented. No focal neurological deficits. Extremities: Symmetric 5 x 5 power. Skin: No rashes, lesions or ulcers Psychiatry: Judgement and insight appear normal. Mood & affect appropriate.     Data Reviewed: I have personally reviewed following labs and imaging studies  CBC:  Recent Labs Lab 09/14/15 0744 09/16/15 0452 09/17/15 0410 09/18/15 0710 09/19/15 0517  WBC 6.3 13.9* 17.3* 16.9* 16.7*  NEUTROABS  --   --   --  15.0*  --   HGB 10.5* 10.2* 9.3* 9.8* 9.3*  HCT 32.2* 31.9* 28.4* 30.1* 29.1*  MCV 79.7 78.4 77.8* 80.1 78.9  PLT 153 186 184 218 [id number]   Basic Metabolic Panel:  Recent Labs Lab 09/14/15 0744 09/16/15 0452 09/17/15 0410 09/18/15 0710 09/19/15 0517  NA 143 141 141 141 142  K 4.4 5.0 4.4 4.8 4.4  CL 109 107 108 107 106  CO2 24 24 23 24 27   GLUCOSE 320* 230* 279* 263* 276*  BUN 20 22* 25* 23* 26*  CREATININE 0.98 1.07* 1.03* 0.93 1.00  CALCIUM 8.5* 8.9 8.9 8.8* 9.0   GFR: Estimated Creatinine Clearance: 59.6 mL/min (by C-G formula based on Cr of 1). Liver Function Tests:  Recent Labs Lab 09/14/15 0744 09/16/15 0452  AST 17 26  ALT 26 23  ALKPHOS 83 81  BILITOT 0.4 0.5  PROT 6.5 6.7  ALBUMIN 2.5* 2.5*   No results for input(s): LIPASE, AMYLASE in the last 168 hours. No results for input(s): AMMONIA in the last 168 hours. Coagulation Profile: No results for input(s): INR, PROTIME in the last 168 hours. Cardiac Enzymes:  Recent Labs Lab 09/15/15 1325 09/15/15 1924 09/16/15 0400  TROPONINI <0.03 <0.03 <0.03   BNP (last 3 results) No results for input(s): PROBNP in the last 8760 hours. HbA1C: No results for input(s): HGBA1C in the last 72 hours. CBG:  Recent Labs Lab 09/18/15 1303  09/18/15 1713 09/18/15 2120 09/19/15 0736 09/19/15 1149  GLUCAP 208* 201* 252* 243* 108*   Lipid Profile: No results for input(s): CHOL, HDL, LDLCALC, TRIG, CHOLHDL, LDLDIRECT in the last 72 hours. Thyroid Function Tests: No results for input(s): TSH, T4TOTAL, FREET4, T3FREE, THYROIDAB in the last 72 hours. Anemia Panel: No results for input(s): VITAMINB12, FOLATE, FERRITIN, TIBC, IRON, RETICCTPCT in the last 72 hours. Urine analysis:    Component Value Date/Time   COLORURINE YELLOW 07/05/2015 1307   APPEARANCEUR CLOUDY* 07/05/2015 1307   LABSPEC 1.015 07/05/2015 1307   PHURINE 5.0 07/05/2015 1307   GLUCOSEU >1000* 07/05/2015 1307   HGBUR NEGATIVE 07/05/2015 1307   [place] 07/05/2015 1307   KETONESUR NEGATIVE 07/05/2015 1307   PROTEINUR NEGATIVE 07/05/2015 1307   UROBILINOGEN 0.2 01/16/2015 1121   NITRITE NEGATIVE 07/05/2015 1307   LEUKOCYTESUR NEGATIVE 07/05/2015 1307   Sepsis Labs: @LABRCNTIP (procalcitonin:4,lacticidven:4)  ) Recent Results (from the past 240 hour(s))  Blood Culture (routine x 2)     Status: None   Collection Time: 09/11/15  3:00 AM  Result Value Ref Range Status   Specimen Description BLOOD LEFT ANTECUBITAL  Final   Special Requests BOTTLES DRAWN AEROBIC AND ANAEROBIC 5CC  Final   Culture  Final    NO GROWTH 5 DAYS Performed at [facility]    Report Status 09/16/2015 FINAL  Final  Blood Culture (routine x 2)     Status: None   Collection Time: 09/11/15  3:09 AM  Result Value Ref Range Status   Specimen Description BLOOD RIGHT HAND  Final   Special Requests BOTTLES DRAWN AEROBIC ONLY 5CC  Final   Culture   Final    NO GROWTH 5 DAYS Performed at [facility]    Report Status 09/16/2015 FINAL  Final  Culture, blood (Routine X 2) w Reflex to ID Panel     Status: None   Collection Time: 09/12/15  1:53 AM  Result Value Ref Range Status   Specimen Description BLOOD RIGHT ARM  Final   Special Requests BOTTLES DRAWN  AEROBIC AND ANAEROBIC 5ML  Final   Culture NO GROWTH 5 DAYS  Final   Report Status 09/17/2015 FINAL  Final  Culture, blood (Routine X 2) w Reflex to ID Panel     Status: None   Collection Time: 09/12/15  2:30 AM  Result Value Ref Range Status   Specimen Description BLOOD LEFT ARM  Final   Special Requests BOTTLES DRAWN AEROBIC AND ANAEROBIC 5ML  Final   Culture NO GROWTH 5 DAYS  Final   Report Status 09/17/2015 FINAL  Final  C difficile quick scan w PCR reflex     Status: None   Collection Time: 09/14/15 10:43 PM  Result Value Ref Range Status   C Diff antigen NEGATIVE NEGATIVE Final   C Diff toxin NEGATIVE NEGATIVE Final   C Diff interpretation Negative for toxigenic C. difficile  Final         Radiology Studies: No results found.      Scheduled Meds: . arformoterol  15 mcg Nebulization BID  . aspirin EC  81 mg Oral Daily  . budesonide (PULMICORT) nebulizer solution  0.25 mg Nebulization BID  . enoxaparin (LOVENOX) injection  40 mg Subcutaneous Daily  . fluorometholone  1 drop Right Eye BID  . fluticasone  2 spray Each Nare Daily  . furosemide  20 mg Oral Daily  . guaiFENesin  1,200 mg Oral BID  . HYDROcodone-homatropine  5 mL Oral Q12H  . insulin aspart  0-15 Units Subcutaneous TID WC  . insulin aspart  0-5 Units Subcutaneous QHS  . insulin aspart  8 Units Subcutaneous TID WC  . insulin detemir  45 Units Subcutaneous QPM  . ipratropium-albuterol  3 mL Nebulization Q6H  . irbesartan  75 mg Oral Daily  . levofloxacin  750 mg Oral Daily  . levothyroxine  150 mcg Oral QAC breakfast  . loratadine  10 mg Oral Daily  . metoprolol succinate  25 mg Oral BID  . montelukast  10 mg Oral QHS  . pantoprazole  40 mg Oral Daily  . predniSONE  60 mg Oral QAC breakfast  . pregabalin  150 mg Oral TID  . saccharomyces boulardii  250 mg Oral BID  . simvastatin  20 mg Oral QHS   Continuous Infusions:     LOS: 7 days    Time spent: 35 minutes    Dezman Granda, MD [facility] Pager [phone]  If 7PM-7AM, please contact night-coverage [email or web address] Password [email or web address] 09/19/2015, 3:23 PM

## 2015-09-19 NOTE — Progress Notes (Signed)
Pt. Clear and diminished. No distress. Pt. Does not take breathing tx at home. Pt. Tolerating well on room air.

## 2015-09-20 LAB — BASIC METABOLIC PANEL
ANION GAP: 10 (ref 5–15)
BUN: 35 mg/dL — ABNORMAL HIGH (ref 6–20)
CALCIUM: 9 mg/dL (ref 8.9–10.3)
CO2: 26 mmol/L (ref 22–32)
Chloride: 105 mmol/L (ref 101–111)
Creatinine, Ser: 1.46 mg/dL — ABNORMAL HIGH (ref 0.44–1.00)
GFR calc Af Amer: 45 mL/min — ABNORMAL LOW (ref >60)
GFR calc non Af Amer: 39 mL/min — ABNORMAL LOW (ref >60)
GLUCOSE: 241 mg/dL — AB (ref 65–99)
Potassium: 4.5 mmol/L (ref 3.5–5.1)
Sodium: 141 mmol/L (ref 135–145)

## 2015-09-20 LAB — GLUCOSE, CAPILLARY
GLUCOSE-CAPILLARY: 319 mg/dL — AB (ref 65–99)
GLUCOSE-CAPILLARY: 283 mg/dL — AB (ref 65–99)
Glucose-Capillary: 305 mg/dL — ABNORMAL HIGH (ref 65–99)
Glucose-Capillary: 231 mg/dL — ABNORMAL HIGH (ref 65–99)

## 2015-09-20 LAB — MAGNESIUM: Magnesium: 2.3 mg/dL (ref 1.7–2.4)

## 2015-09-20 LAB — CBC
HEMATOCRIT: 30.4 % — AB (ref 36.0–46.0)
HEMATOCRIT: 31.8 % — AB (ref 36.0–46.0)
HEMOGLOBIN: 9.8 g/dL — AB (ref 12.0–15.0)
Hemoglobin: 10.2 g/dL — ABNORMAL LOW (ref 12.0–15.0)
MCH: 25.5 pg — AB (ref 26.0–34.0)
MCH: 26.4 pg (ref 26.0–34.0)
MCHC: 32.2 g/dL (ref 30.0–36.0)
MCHC: 32.1 g/dL (ref 30.0–36.0)
MCV: 79 fL (ref 78.0–100.0)
MCV: 82.4 fL (ref 78.0–100.0)
Platelets: 216 10*3/uL (ref 150–400)
Platelets: 243 10*3/uL (ref 150–400)
RBC: 3.85 MIL/uL — AB (ref 3.87–5.11)
RBC: 3.86 MIL/uL — ABNORMAL LOW (ref 3.87–5.11)
RDW: 15.1 % (ref 11.5–15.5)
RDW: 16.7 % — AB (ref 11.5–15.5)
WBC: 16.1 10*3/uL — ABNORMAL HIGH (ref 4.0–10.5)
WBC: 16.6 10*3/uL — AB (ref 4.0–10.5)

## 2015-09-20 NOTE — Progress Notes (Signed)
PROGRESS NOTE    Amy [name]  [id number] DOB: Jun 28, 1960 DOA: 09/12/2015 PCP: Velna Hatchet, MD  Outpatient Specialists:     Brief Narrative:  Amy [name] is a 55 y.o. female with medical history significant of diabetes mellitus with complications of neuropathy, remote tobacco abuse, legally blind, hypothyroidism, HTN, HLD, asthma; who presents with complaints of cough and shortness of breath. Symptoms initially started 3 days ago with generalized aches, productive cough, nausea, vomiting, diarrhea. The following day she was was seen at the emergency department at [facility] and diagnosed with bronchopneumonia on x-ray. She was given prescription for cefuroxime and doxycycline, but did not take these medications once home as she has an allergy to penicillins. Although legally blind at baseline patient lives at home alone.   Assessment & Plan:   Principal Problem:   CAP (community acquired pneumonia) Active Problems:   Hypothyroidism   Nausea vomiting and diarrhea   Diabetes mellitus with neurological manifestations ([place])   Hypoxia   SOB (shortness of breath)  #1 multifocal community-acquired pneumonia with hypoxia Patient still with cough shortness of breath noted to be hypoxic on ambulation down to 82% on 09/15/2015. Chest x-ray showed bilateral upper lobe pneumonia. Patient with intermittent fevers. CT chest done showed multifocal airspace disease without empyema. Blood cultures pending. Sputum Gram stain and culture pending. Urine Legionella antigen negative. Urine pneumococcus antigen negative. Continue scheduled nebulizers. Continue Mucinex. Continue Pulmicort and Brovana, Claritin and Flonase. Continue oral steroid taper. Discontinued Rocephin and azithromycin. Continue oral Levaquin to complete course of antibiotic treatment. Supportive care.   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 CBGs  currently ranging from 283 - 319. Likely in part due to IV steroids. Increased Levemir to 45 units daily. Continue sliding scale insulin and monitor closely with steroid taper. Continue meal coverage novolog 8 units TID with meals. Follow.  #3 hypothyroidism Continue Synthroid.  #4 diarrhea C. difficile negative. Continue Florastor.  #5 acute kidney injury Resolved.  #6 nausea vomiting diarrhea Improved. Follow.  #7 hypertension Stable. Continue Avapro, Toprol-XL.  #8 gastroesophageal reflux disease PPI.  #9 hyperlipidemia Continue statin.   DVT prophylaxis: Lovenox Code Status: Full Family Communication: Updated patient. No family at bedside. Disposition Plan: SNF when medically stable with clinical improvement and resolution of hypoxia, hopefully tomorrow.    Consultants:   None  Procedures:   CT chest 09/14/2015  Chest x-ray 09/12/2015, 09/15/2015  Antimicrobials:  Oral azithromycin 09/16/2015>>>>. 09/18/2015  IV Rocephin 09/12/2015>>>>> 09/18/2015  IV azithromycin 09/12/2015>>>> 09/15/2015  Oral Levaquin 09/18/2015   Subjective: Patient states feeling better. Cough improved. No chest pain. Shortness of breath improved.  Objective: Filed Vitals:   09/20/15 0922 09/20/15 0926 09/20/15 1115 09/20/15 1309  BP:   94/42 120/56  Pulse:   88 77  Temp:    98 F (36.7 C)  TempSrc:    Axillary  Resp:      Height:      Weight:      SpO2: 100% 100% 97% 97%    Intake/Output Summary (Last 24 hours) at 09/20/15 1402 Last data filed at 09/20/15 1349  Gross per 24 hour  Intake    640 ml  Output   2300 ml  Net  -1660 ml   Filed Weights   09/12/15 0525  Weight: 85.412 kg (188 lb 4.8 oz)    Examination:  General exam: Appears calm and comfortable  Respiratory system: Less diffuse rhonchorous breath  sounds. Min Expiratory wheezing. Cardiovascular system: S1 & S2 heard, RRR. No JVD, murmurs, rubs, gallops or clicks. No pedal edema. Gastrointestinal  system: Abdomen is nondistended, soft and nontender. No organomegaly or masses felt. Normal bowel sounds heard. Central nervous system: Alert and oriented. No focal neurological deficits. Extremities: Symmetric 5 x 5 power. Skin: No rashes, lesions or ulcers Psychiatry: Judgement and insight appear normal. Mood & affect appropriate.     Data Reviewed: I have personally reviewed following labs and imaging studies  CBC:  Recent Labs Lab 09/16/15 0452 09/17/15 0410 09/18/15 0710 09/19/15 0517 09/20/15 0653  WBC 13.9* 17.3* 16.9* 16.7* 16.1*  NEUTROABS  --   --  15.0*  --   --   HGB 10.2* 9.3* 9.8* 9.3* 9.8*  HCT 31.9* 28.4* 30.1* 29.1* 30.4*  MCV 78.4 77.8* 80.1 78.9 79.0  PLT 186 184 218 242 [id number]   Basic Metabolic Panel:  Recent Labs Lab 09/14/15 0744 09/16/15 0452 09/17/15 0410 09/18/15 0710 09/19/15 0517  NA 143 141 141 141 142  K 4.4 5.0 4.4 4.8 4.4  CL 109 107 108 107 106  CO2 24 24 23 24 27   GLUCOSE 320* 230* 279* 263* 276*  BUN 20 22* 25* 23* 26*  CREATININE 0.98 1.07* 1.03* 0.93 1.00  CALCIUM 8.5* 8.9 8.9 8.8* 9.0   GFR: Estimated Creatinine Clearance: 59.6 mL/min (by C-G formula based on Cr of 1). Liver Function Tests:  Recent Labs Lab 09/14/15 0744 09/16/15 0452  AST 17 26  ALT 26 23  ALKPHOS 83 81  BILITOT 0.4 0.5  PROT 6.5 6.7  ALBUMIN 2.5* 2.5*   No results for input(s): LIPASE, AMYLASE in the last 168 hours. No results for input(s): AMMONIA in the last 168 hours. Coagulation Profile: No results for input(s): INR, PROTIME in the last 168 hours. Cardiac Enzymes:  Recent Labs Lab 09/15/15 1325 09/15/15 1924 09/16/15 0400  TROPONINI <0.03 <0.03 <0.03   BNP (last 3 results) No results for input(s): PROBNP in the last 8760 hours. HbA1C: No results for input(s): HGBA1C in the last 72 hours. CBG:  Recent Labs Lab 09/19/15 1149 09/19/15 1708 09/19/15 2118 09/20/15 0752 09/20/15 1213  GLUCAP 108* 159* 202* 319* 283*   Lipid  Profile: No results for input(s): CHOL, HDL, LDLCALC, TRIG, CHOLHDL, LDLDIRECT in the last 72 hours. Thyroid Function Tests: No results for input(s): TSH, T4TOTAL, FREET4, T3FREE, THYROIDAB in the last 72 hours. Anemia Panel: No results for input(s): VITAMINB12, FOLATE, FERRITIN, TIBC, IRON, RETICCTPCT in the last 72 hours. Urine analysis:    Component Value Date/Time   COLORURINE YELLOW 07/05/2015 1307   APPEARANCEUR CLOUDY* 07/05/2015 1307   LABSPEC 1.015 07/05/2015 1307   PHURINE 5.0 07/05/2015 1307   GLUCOSEU >1000* 07/05/2015 1307   HGBUR NEGATIVE 07/05/2015 1307   [place] 07/05/2015 1307   KETONESUR NEGATIVE 07/05/2015 1307   PROTEINUR NEGATIVE 07/05/2015 1307   UROBILINOGEN 0.2 01/16/2015 1121   NITRITE NEGATIVE 07/05/2015 1307   LEUKOCYTESUR NEGATIVE 07/05/2015 1307   Sepsis Labs: @LABRCNTIP (procalcitonin:4,lacticidven:4)  ) Recent Results (from the past 240 hour(s))  Blood Culture (routine x 2)     Status: None   Collection Time: 09/11/15  3:00 AM  Result Value Ref Range Status   Specimen Description BLOOD LEFT ANTECUBITAL  Final   Special Requests BOTTLES DRAWN AEROBIC AND ANAEROBIC 5CC  Final   Culture   Final    NO GROWTH 5 DAYS Performed at [facility]    Report Status 09/16/2015 FINAL  Final  Blood Culture (routine x 2)     Status: None   Collection Time: 09/11/15  3:09 AM  Result Value Ref Range Status   Specimen Description BLOOD RIGHT HAND  Final   Special Requests BOTTLES DRAWN AEROBIC ONLY 5CC  Final   Culture   Final    NO GROWTH 5 DAYS Performed at [facility]    Report Status 09/16/2015 FINAL  Final  Culture, blood (Routine X 2) w Reflex to ID Panel     Status: None   Collection Time: 09/12/15  1:53 AM  Result Value Ref Range Status   Specimen Description BLOOD RIGHT ARM  Final   Special Requests BOTTLES DRAWN AEROBIC AND ANAEROBIC 5ML  Final   Culture NO GROWTH 5 DAYS  Final   Report Status 09/17/2015 FINAL   Final  Culture, blood (Routine X 2) w Reflex to ID Panel     Status: None   Collection Time: 09/12/15  2:30 AM  Result Value Ref Range Status   Specimen Description BLOOD LEFT ARM  Final   Special Requests BOTTLES DRAWN AEROBIC AND ANAEROBIC 5ML  Final   Culture NO GROWTH 5 DAYS  Final   Report Status 09/17/2015 FINAL  Final  C difficile quick scan w PCR reflex     Status: None   Collection Time: 09/14/15 10:43 PM  Result Value Ref Range Status   C Diff antigen NEGATIVE NEGATIVE Final   C Diff toxin NEGATIVE NEGATIVE Final   C Diff interpretation Negative for toxigenic C. difficile  Final         Radiology Studies: No results found.      Scheduled Meds: . arformoterol  15 mcg Nebulization BID  . aspirin EC  81 mg Oral Daily  . budesonide (PULMICORT) nebulizer solution  0.25 mg Nebulization BID  . enoxaparin (LOVENOX) injection  40 mg Subcutaneous Daily  . fluorometholone  1 drop Right Eye BID  . fluticasone  2 spray Each Nare Daily  . furosemide  20 mg Oral Daily  . guaiFENesin  1,200 mg Oral BID  . HYDROcodone-homatropine  5 mL Oral Q12H  . insulin aspart  0-15 Units Subcutaneous TID WC  . insulin aspart  0-5 Units Subcutaneous QHS  . insulin aspart  8 Units Subcutaneous TID WC  . insulin detemir  45 Units Subcutaneous QPM  . ipratropium-albuterol  3 mL Nebulization TID  . irbesartan  75 mg Oral Daily  . levofloxacin  750 mg Oral Daily  . levothyroxine  150 mcg Oral QAC breakfast  . loratadine  10 mg Oral Daily  . metoprolol succinate  25 mg Oral BID  . montelukast  10 mg Oral QHS  . pantoprazole  40 mg Oral Daily  . predniSONE  60 mg Oral QAC breakfast  . pregabalin  150 mg Oral TID  . saccharomyces boulardii  250 mg Oral BID  . simvastatin  20 mg Oral QHS   Continuous Infusions:     LOS: 8 days    Time spent: 35 minutes    Addi Pak, MD [facility] Pager [phone]  If 7PM-7AM, please contact night-coverage [email or web address] Password  [email or web address] 09/20/2015, 2:02 PM

## 2015-09-21 LAB — BASIC METABOLIC PANEL
ANION GAP: 10 (ref 5–15)
BUN: 31 mg/dL — ABNORMAL HIGH (ref 6–20)
CALCIUM: 9 mg/dL (ref 8.9–10.3)
CO2: 28 mmol/L (ref 22–32)
Chloride: 103 mmol/L (ref 101–111)
Creatinine, Ser: 1.22 mg/dL — ABNORMAL HIGH (ref 0.44–1.00)
GFR, EST AFRICAN AMERICAN: 56 mL/min — AB (ref >60)
GFR, EST NON AFRICAN AMERICAN: 49 mL/min — AB (ref >60)
Glucose, Bld: 262 mg/dL — ABNORMAL HIGH (ref 65–99)
Potassium: 4.2 mmol/L (ref 3.5–5.1)
Sodium: 141 mmol/L (ref 135–145)

## 2015-09-21 LAB — CBC
HEMATOCRIT: 30.4 % — AB (ref 36.0–46.0)
HEMOGLOBIN: 9.7 g/dL — AB (ref 12.0–15.0)
MCH: 25.3 pg — AB (ref 26.0–34.0)
MCHC: 31.9 g/dL (ref 30.0–36.0)
MCV: 79.2 fL (ref 78.0–100.0)
Platelets: 239 10*3/uL (ref 150–400)
RBC: 3.84 MIL/uL — ABNORMAL LOW (ref 3.87–5.11)
RDW: 15.1 % (ref 11.5–15.5)
WBC: 15.4 10*3/uL — ABNORMAL HIGH (ref 4.0–10.5)

## 2015-09-21 LAB — GLUCOSE, CAPILLARY
Glucose-Capillary: 266 mg/dL — ABNORMAL HIGH (ref 65–99)
Glucose-Capillary: 263 mg/dL — ABNORMAL HIGH (ref 65–99)

## 2015-09-21 LAB — MAGNESIUM: Magnesium: 2.2 mg/dL (ref 1.7–2.4)

## 2015-09-21 MED ORDER — GUAIFENESIN ER 600 MG PO TB12: 1200.0000 mg | ORAL_TABLET | Freq: Two times a day (BID) | ORAL | Status: DC

## 2015-09-21 MED ORDER — IPRATROPIUM-ALBUTEROL 0.5-2.5 (3) MG/3ML IN SOLN: 3.0000 mL | Freq: Three times a day (TID) | RESPIRATORY_TRACT | Status: DC

## 2015-09-21 MED ORDER — SACCHAROMYCES BOULARDII 250 MG PO CAPS: 250.0000 mg | ORAL_CAPSULE | Freq: Two times a day (BID) | ORAL | Status: DC

## 2015-09-21 MED ORDER — LEVEMIR FLEXTOUCH 100 UNIT/ML ~~LOC~~ SOPN: 45.0000 [IU] | PEN_INJECTOR | Freq: Every evening | SUBCUTANEOUS | Status: DC

## 2015-09-21 MED ORDER — FLUTICASONE PROPIONATE 50 MCG/ACT NA SUSP: 2.0000 | Freq: Every day | NASAL | Status: DC

## 2015-09-21 MED ORDER — BENZONATATE 200 MG PO CAPS: 200.0000 mg | ORAL_CAPSULE | Freq: Two times a day (BID) | ORAL | Status: DC | PRN

## 2015-09-21 MED ORDER — LEVOFLOXACIN 750 MG PO TABS: 750.0000 mg | ORAL_TABLET | Freq: Every day | ORAL | Status: AC

## 2015-09-21 MED ORDER — PREDNISONE 20 MG PO TABS
20.0000 mg | ORAL_TABLET | Freq: Every day | ORAL | Status: DC
Start: 2015-09-21 — End: 2016-01-25

## 2015-09-21 MED ORDER — LORATADINE 10 MG PO TABS: 10.0000 mg | ORAL_TABLET | Freq: Every day | ORAL | Status: DC

## 2015-09-21 MED ORDER — DIPHENOXYLATE-ATROPINE 2.5-0.025 MG PO TABS: 1.0000 | ORAL_TABLET | Freq: Four times a day (QID) | ORAL | Status: DC | PRN

## 2015-09-21 MED ORDER — LIDOCAINE 5 % EX PTCH: 1.0000 | MEDICATED_PATCH | Freq: Every day | CUTANEOUS | Status: DC | PRN

## 2015-09-21 NOTE — Clinical Social Work Placement (Signed)
   CLINICAL SOCIAL WORK PLACEMENT  NOTE  Date:  09/21/2015  Patient Details  Name: Amy [name] MRN: [id number] Date of Birth: 1960/11/10  Clinical Social Work is seeking post-discharge placement for this patient at the [place] level of care (*CSW will initial, date and re-position this form in  chart as items are completed):  Yes   Patient/family provided with [place] Work Department's list of facilities offering this level of care within the geographic area requested by the patient (or if unable, by the patient's family).  Yes   Patient/family informed of their freedom to choose among providers that offer the needed level of care, that participate in Medicare, Medicaid or managed care program needed by the patient, have an available bed and are willing to accept the patient.  Yes   Patient/family informed of [facility]'s ownership interest in [facility] and [facility], as well as of the fact that they are under no obligation to receive care at these facilities.  PASRR submitted to EDS on       PASRR number received on       Existing PASRR number confirmed on 09/17/15     FL2 transmitted to all facilities in geographic area requested by pt/family on 09/17/15     FL2 transmitted to all facilities within larger geographic area on       Patient informed that his/her managed care company has contracts with or will negotiate with certain facilities, including the following:        Yes   Patient/family informed of bed offers received.  Patient chooses bed at [facility]     Physician recommends and patient chooses bed at      Patient to be transferred to [facility] on 09/21/15.  Patient to be transferred to facility by Car     Patient family notified on 09/21/15 of transfer.  Name of family member notified:  Sister     PHYSICIAN Please sign FL2     Additional Comment:     _______________________________________________ Benard Halsted, [place] 09/21/2015, 2:33 PM

## 2015-09-21 NOTE — Progress Notes (Signed)
Patient will DC to: [facility] Anticipated DC date: 09/21/15 Family notified: Sister Transport by: Sister by car   Per MD patient ready for DC to [facility]. RN, patient, patient's family, and facility notified of DC. RN given number for report. DC packet on chart.   CSW signing off.  Cedric Fishman, [place] Social Worker [phone]

## 2015-09-21 NOTE — Discharge Summary (Signed)
Physician Discharge Summary  Amy [name] [id number] DOB: 1960-12-07 DOA: 09/12/2015  PCP: Velna Hatchet, MD  Admit date: 09/12/2015 Discharge date: 09/21/2015  Time spent: 65 minutes  Recommendations for Outpatient Follow-up:  1. Follow-up with M.D. at skilled nursing facility. On follow-up patient's respiratory status will need to be reassessed. Patient will also need a basic metabolic profile drawn in 1 week.   Discharge Diagnoses:  Principal Problem:   CAP (community acquired pneumonia) Active Problems:   Hypothyroidism   Nausea vomiting and diarrhea   Diabetes mellitus with neurological manifestations (HCC)   Hypoxia   SOB (shortness of breath)   Discharge Condition: Stable and improved  Diet recommendation: Heart healthy  Filed Weights   09/12/15 0525  Weight: 85.412 kg (188 lb 4.8 oz)    History of present illness:  Per Dr Dorian Pod Amy [name] is a 55 y.o. female with medical history significant of diabetes mellitus with complications of neuropathy, remote tobacco abuse, legally blind, hypothyroidism, HTN, HLD, asthma; who presented with complaints of cough and shortness of breath. Symptoms initially started 3 days prior to admission, with generalized aches, productive cough, nausea, vomiting, diarrhea. The following day she was was seen at the emergency department at [facility] and diagnosed with bronchopneumonia on x-ray. She was given prescription for cefuroxime and doxycycline, but did not take these medications once home as she has an allergy to penicillins. Although legally blind at baseline patient lives at home alone.  ED Course: Patient was seen have O2 sats as low as 82% with ambulation. Chest x-ray revealed continued right upper lobe pneumonia. Otherwise lab work was unremarkable except for anemia and elevated blood glucose level of 307. Patient was started on azithromycin and ceftriaxone in the ED.  Hospital Course:  #1 multifocal community-acquired  pneumonia with hypoxia Patient Was admitted with a community-acquired pneumonia and noted to be hypoxic. Patient was placed empirically on IV Rocephin IV azithromycin and followed. Patient was also maintained on nebulizer treatments as well as steroids and pulmonary toilet. Patient will was noted to still be hypoxic during the hospitalization and noted to be hypoxic on ambulation down to 82% on 09/15/2015. Chest x-ray showed bilateral upper lobe pneumonia. Patient with intermittent fevers. CT chest done showed multifocal airspace disease without empyema. Blood cultures were obtained which were negative. Sputum Gram stain and culture were also obtained with no growth to date. Urine Legionella antigen negative. Urine pneumococcus antigen negative. Patient was maintained on scheduled nebulizers, Mucinex, Pulmicort, Brovana, Claritin, Flonase and IV steroids which was subsequently tapered to oral steroids. Patient improved clinically on a daily basis. IV Rocephin and oral azithromycin was subsequently discontinued and patient transitioned to oral Levaquin. Patient be discharged to a skilled nursing facility with 5 more days of oral Levaquin to complete a two-week course of antibiotic treatment. Outpatient follow-up.   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 CBGs ranged in the 200s to 300s felt to be likely impart from steroids. Initially patient's Levemir dose was decreased to have it was increased back to her home regimen of 45 units daily with improvement in CBGs. Patient was also maintained on meal coverage NovoLog 8 units 3 times daily as well as sliding scale insulin. Outpatient follow-up.   #3 hypothyroidism Continued on home regimen of Synthroid.  #4 diarrhea C. difficile negative. Patient was maintained on Florastor.  #5 acute kidney injury On admission patient was noted to have acute kidney injury.  Patient's diuretics were  held. Patient was hydrated with IV fluids with resolution of acute kidney injury. Resolved.  #6 nausea vomiting diarrhea Improved. Follow.  #7 hypertension Stable. Continued on home regimen of Avapro, Toprol-XL.Patient's lasix was resumed 2 days prior to discharge.  #8 gastroesophageal reflux disease PPI.  #9 hyperlipidemia Continued on statin.   Procedures:  CT chest 09/14/2015  Chest x-ray 09/12/2015, 09/15/2015    Consultations:  None  Discharge Exam: Filed Vitals:   09/21/15 1128 09/21/15 1357  BP: 120/56 103/49  Pulse: 78 81  Temp:  98.7 F (37.1 C)  Resp:  18    General: NAD Cardiovascular: RRR Respiratory: CTAB. Minimal expiratory wheezes.  Discharge Instructions   Discharge Instructions    Diet - low sodium heart healthy    Complete by:  As directed      Increase activity slowly    Complete by:  As directed           Current Discharge Medication List    START taking these medications   Details  benzonatate (TESSALON) 200 MG capsule Take 1 capsule (200 mg total) by mouth 2 (two) times daily as needed for cough. Qty: 20 capsule, Refills: 0    diphenoxylate-atropine (LOMOTIL) 2.5-0.025 MG tablet Take 1 tablet by mouth 4 (four) times daily as needed for diarrhea or loose stools. Qty: 30 tablet, Refills: 0    fluticasone (FLONASE) 50 MCG/ACT nasal spray Place 2 sprays into both nostrils daily. Use for 5 days then stop. Qty: 9.9 g, Refills: 0    guaiFENesin (MUCINEX) 600 MG 12 hr tablet Take 2 tablets (1,200 mg total) by mouth 2 (two) times daily. Take for 5 days then stop. Qty: 20 tablet, Refills: 0    ipratropium-albuterol (DUONEB) 0.5-2.5 (3) MG/3ML SOLN Take 3 mLs by nebulization 3 (three) times daily. Use for 5 days then use every 6 hours as needed, for SOB, wheezing. Qty: 360 mL, Refills: 0    levofloxacin (LEVAQUIN) 750 MG tablet Take 1 tablet (750 mg total) by mouth daily. Take for 5 days then stop. Qty: 5 tablet, Refills: 0     loratadine (CLARITIN) 10 MG tablet Take 1 tablet (10 mg total) by mouth daily. Take for 5 days, then stop.    predniSONE (DELTASONE) 20 MG tablet Take 1-2 tablets (20-40 mg total) by mouth daily with breakfast. Take 2 tablets (40mg ) daily x 3 days, then 1 tablet (20mg ) daily x 3 days then stop. Qty: 9 tablet, Refills: 0    saccharomyces boulardii (FLORASTOR) 250 MG capsule Take 1 capsule (250 mg total) by mouth 2 (two) times daily.      CONTINUE these medications which have CHANGED   Details  LEVEMIR FLEXTOUCH 100 UNIT/ML Pen Inject 45 Units into the skin every evening. Qty: 15 mL, Refills: 0    lidocaine (LIDODERM) 5 % Place 1 patch onto the skin daily as needed (pain). Remove & Discard patch within 12 hours or as directed by MD Qty: 6 patch, Refills: 0      CONTINUE these medications which have NOT CHANGED   Details  albuterol (PROVENTIL HFA;VENTOLIN HFA) 108 (90 BASE) MCG/ACT inhaler Inhale 2 puffs into the lungs every 4 (four) hours as needed for wheezing or shortness of breath. For short    aspirin EC 81 MG tablet Take 81 mg by mouth daily.    clobetasol ointment (TEMOVATE) [id number] % Apply 1 application topically 2 (two) times daily as needed (dermatosis).     diclofenac sodium (VOLTAREN)  1 % GEL Apply 2 g topically every 6 (six) hours as needed (for pain). Qty: 1 Tube, Refills: 0    Dulaglutide (TRULICITY Echo) Inject 1.7 mLs into the skin once a week.     esomeprazole (NEXIUM) 40 MG capsule Take 40 mg by mouth 2 (two) times daily before a meal.     fluorometholone (FML FORTE) 0.25 % ophthalmic suspension Place 1 drop into the right eye 2 (two) times daily.    furosemide (LASIX) 20 MG tablet Take 20 mg by mouth daily.     HUMALOG KWIKPEN 100 UNIT/ML KiwkPen Inject 8-14 Units into the skin 3 (three) times daily. 8 units with breakfast 10 units with lunch and 14 units with dinner    irbesartan (AVAPRO) 75 MG tablet Take 75 mg by mouth daily.    levothyroxine (SYNTHROID,  LEVOTHROID) 150 MCG tablet Take 150 mcg by mouth daily.    Linaclotide (LINZESS) 145 MCG CAPS capsule Take 145 mcg by mouth daily as needed (constipation).     LYRICA 150 MG capsule Take 150 mg by mouth 3 (three) times daily.    metoprolol succinate (TOPROL-XL) 25 MG 24 hr tablet Take 25 mg by mouth 2 (two) times daily.    midodrine (PROAMATINE) 10 MG tablet Take 10 mg by mouth 3 (three) times daily.     montelukast (SINGULAIR) 10 MG tablet Take 10 mg by mouth at bedtime.    neomycin-polymyxin b-dexamethasone (MAXITROL) 3.5-10000-0.1 OINT Use 1/4" ribbon to affected eye once a day as needed for antibiotic. If you develop allergy symptoms, stop & call our office.    simvastatin (ZOCOR) 20 MG tablet Take 20 mg by mouth at bedtime.       STOP taking these medications     cefUROXime (CEFTIN) 250 MG tablet      doxycycline (VIBRAMYCIN) 100 MG capsule        Allergies  Allergen Reactions  . Codeine Nausea Only  . Iodine-131 Other (See Comments)    hives  . Iohexol      Code: HIVES, Desc: pt developed itching and hives along with nasal congestion; needs 13 hour premeds for future studies, Onset Date: [id number]   . Penicillins Hives    Has patient had a PCN reaction causing immediate rash, facial/tongue/throat swelling, SOB or lightheadedness with hypotension: unknown Has patient had a PCN reaction causing severe rash involving mucus membranes or skin necrosis:unknown Has patient had a PCN reaction that required hospitalization unknown Has patient had a PCN reaction occurring within the last 10 years:unknown If all of the above answers are "NO", then may proceed with Cephalosporin use.   . Sulfa Antibiotics Nausea And Vomiting   Follow-up Information    Please follow up.   Why:  f/u with MD at SNF       The results of significant diagnostics from this hospitalization (including imaging, microbiology, ancillary and laboratory) are listed below for reference.    Significant  Diagnostic Studies: Ct Abdomen Pelvis Wo Contrast  09/11/2015  CLINICAL DATA:  Lower abdominal pain for 1 day.  Contrast allergy EXAM: CT ABDOMEN AND PELVIS WITHOUT CONTRAST TECHNIQUE: Multidetector CT imaging of the abdomen and pelvis was performed following the standard protocol without IV contrast. COMPARISON:  06/14/2015 FINDINGS: Lower chest and abdominal wall: Medication injection sites in the subcutaneous lower abdominal wall Hepatobiliary: Roughly 15 mm low-density lesion in the right liver is subtle. No evidence of growth since 2013 comparison, compatible with a benign process.Cholecystectomy. Negative common bile duct. Pancreas:  Unremarkable. Spleen: Unremarkable. Adrenals/Urinary Tract: Negative adrenals. No hydronephrosis or stone. Physiologic distension of the bladder. Chronic wall thickening which is nonspecific - no pericystic inflammation. Reproductive:Hysterectomy with negative adnexae. Stomach/Bowel:  No obstruction. No appendicitis. Vascular/Lymphatic: Prominent atherosclerosis for age. No mass or adenopathy. Peritoneal: No ascites or pneumoperitoneum. Musculoskeletal: No acute abnormalities. IMPRESSION: No acute finding or change from prior. No explanation for abdominal pain. Electronically Signed   By: Monte Fantasia M.D.   On: 09/11/2015 05:52   Dg Chest 2 View  09/15/2015  CLINICAL DATA:  SOB (shortness of breath) [id number], wheezing, dry cough, fever, x 2 days EXAM: CHEST  2 VIEW COMPARISON:  None. FINDINGS: Bilateral patchy airspace disease in the upper and lower lobes most consistent with multi lobar pneumonia. There is no pleural effusion or pneumothorax. The heart and mediastinal contours are unremarkable. The osseous structures are unremarkable. IMPRESSION: Bilateral patchy airspace disease in the upper and lower lobes most consistent with multi lobar pneumonia. Electronically Signed   By: Kathreen Devoid   On: 09/15/2015 14:21   Dg Chest 2 View  09/12/2015  CLINICAL DATA:  Worsening  shortness of breath EXAM: CHEST  2 VIEW COMPARISON:  Yesterday FINDINGS: Unchanged right upper lobe acute airspace disease consistent with pneumonia. Increased interstitial coarsening and fissural thickening. Normal heart size and mediastinal contours. No effusion or pneumothorax. IMPRESSION: 1. Essentially stable right upper lobe pneumonia. 2. New interstitial coarsening suggesting early edema. Electronically Signed   By: Monte Fantasia M.D.   On: 09/12/2015 02:34   Dg Chest 2 View  08/26/2015  CLINICAL DATA:  Bilateral leg swelling for 2 weeks. Shortness of breath. EXAM: CHEST  2 VIEW COMPARISON:  07/05/2015 FINDINGS: Peribronchial thickening and interstitial prominence within the lungs. Heart is normal size. No effusions. No acute bony abnormality. IMPRESSION: Peribronchial thickening and interstitial prominence, new since prior study. This could represent bronchitis or interstitial pneumonia. Electronically Signed   By: Rolm Baptise M.D.   On: 08/26/2015 20:51   Ct Chest Wo Contrast  09/14/2015  CLINICAL DATA:  Shortness of breath, left-sided chest pain, fever and cough. EXAM: CT CHEST WITHOUT CONTRAST TECHNIQUE: Multidetector CT imaging of the chest was performed following the standard protocol without IV contrast. COMPARISON:  Chest x-ray 09/12/2015. FINDINGS: Mediastinum/Nodes: No breast masses, supraclavicular or axillary lymphadenopathy. Small scattered lymph nodes are noted bilaterally. Dense/ macro calcification noted in the right breast. The heart is normal in size. No pericardial effusion. Age advanced coronary artery calcifications. The aorta is normal in caliber. Borderline mediastinal and hilar lymph nodes likely hyperplastic/ inflammatory related to the lung disease. No mediastinal mass. The esophagus is grossly normal. Lungs/Pleura: Patchy bilateral airspace process most notable in the right upper lobe with more confluent consolidation and air bronchograms. Findings most likely represent  multifocal/polylobar pneumonia. No worrisome pulmonary lesions or pleural effusion. Upper abdomen: No significant findings. Status post cholecystectomy. Musculoskeletal: No significant bony findings. IMPRESSION: Multifocal airspace process most likely polylobar pneumonia. Reactive mediastinal and hilar lymph nodes. Age advanced coronary artery calcifications. Electronically Signed   By: Marijo Sanes M.D.   On: 09/14/2015 12:50   Dg Chest Port 1 View  09/11/2015  CLINICAL DATA:  Presyncope, diarrhea, nausea and body aches. History of asthma, diabetes, hypertension. EXAM: PORTABLE CHEST 1 VIEW COMPARISON:  Chest radiograph August 26, 2015 FINDINGS: New large consolidation RIGHT mid upper lung zone. Mild bronchitic changes. Cardiomediastinal silhouette is normal. No pneumothorax. Large body habitus. Osseous structure nonsuspicious. IMPRESSION: New large RIGHT lung consolidation concerning for bronchopneumonia.  Followup PA and lateral chest X-ray is recommended in 3-4 weeks following trial of antibiotic therapy to ensure resolution and exclude underlying malignancy. Electronically Signed   By: Elon Alas M.D.   On: 09/11/2015 03:27    Microbiology: Recent Results (from the past 240 hour(s))  Culture, blood (Routine X 2) w Reflex to ID Panel     Status: None   Collection Time: 09/12/15  1:53 AM  Result Value Ref Range Status   Specimen Description BLOOD RIGHT ARM  Final   Special Requests BOTTLES DRAWN AEROBIC AND ANAEROBIC 5ML  Final   Culture NO GROWTH 5 DAYS  Final   Report Status 09/17/2015 FINAL  Final  Culture, blood (Routine X 2) w Reflex to ID Panel     Status: None   Collection Time: 09/12/15  2:30 AM  Result Value Ref Range Status   Specimen Description BLOOD LEFT ARM  Final   Special Requests BOTTLES DRAWN AEROBIC AND ANAEROBIC 5ML  Final   Culture NO GROWTH 5 DAYS  Final   Report Status 09/17/2015 FINAL  Final  C difficile quick scan w PCR reflex     Status: None   Collection  Time: 09/14/15 10:43 PM  Result Value Ref Range Status   C Diff antigen NEGATIVE NEGATIVE Final   C Diff toxin NEGATIVE NEGATIVE Final   C Diff interpretation Negative for toxigenic C. difficile  Final     Labs: Basic Metabolic Panel:  Recent Labs Lab 09/17/15 0410 09/18/15 0710 09/19/15 0517 09/20/15 2155 09/21/15 0520  NA 141 141 142 141 141  K 4.4 4.8 4.4 4.5 4.2  CL 108 107 106 105 103  CO2 23 24 27 26 28   GLUCOSE 279* 263* 276* 241* 262*  BUN 25* 23* 26* 35* 31*  CREATININE 1.03* 0.93 1.00 1.46* 1.22*  CALCIUM 8.9 8.8* 9.0 9.0 9.0  MG  --   --   --  2.3 2.2   Liver Function Tests:  Recent Labs Lab 09/16/15 0452  AST 26  ALT 23  ALKPHOS 81  BILITOT 0.5  PROT 6.7  ALBUMIN 2.5*   No results for input(s): LIPASE, AMYLASE in the last 168 hours. No results for input(s): AMMONIA in the last 168 hours. CBC:  Recent Labs Lab 09/18/15 0710 09/19/15 0517 09/20/15 0653 09/20/15 2025 09/21/15 0520  WBC 16.9* 16.7* 16.1* 16.6* 15.4*  NEUTROABS 15.0*  --   --   --   --   HGB 9.8* 9.3* 9.8* 10.2* 9.7*  HCT 30.1* 29.1* 30.4* 31.8* 30.4*  MCV 80.1 78.9 79.0 82.4 79.2  PLT 218 242 216 243 239   Cardiac Enzymes:  Recent Labs Lab 09/15/15 1325 09/15/15 1924 09/16/15 0400  TROPONINI <0.03 <0.03 <0.03   BNP: BNP (last 3 results)  Recent Labs  08/26/15 2040  BNP 56.5    ProBNP (last 3 results) No results for input(s): PROBNP in the last 8760 hours.  CBG:  Recent Labs Lab 09/20/15 1213 09/20/15 1636 09/20/15 2122 09/21/15 0744 09/21/15 1210  GLUCAP 283* 305* 231* 266* 263*       Signed:  THOMPSON,DANIEL MD.  [facility] 09/21/2015, 2:13 PM

## 2015-09-21 NOTE — Progress Notes (Signed)
Report given to [facility] at [facility]. Prescriptions given to patient. Pt discharging via private transportation to [facility].

## 2015-09-21 NOTE — Progress Notes (Signed)
Physical Therapy Treatment Patient Details Name: Amy [name] MRN: [name] DOB: 01-23-1961 Today's Date: 09/21/2015    History of Present Illness Amy [name] is a 55 y.o. female with medical history significant of diabetes mellitus with complications of neuropathy, remote tobacco abuse, legally blind, hypothyroidism, HTN, HLD, asthma; who presents with complaints of cough and shortness of breath. Symptoms initially started 3 days ago with generalized aches, productive cough, nausea, vomiting, diarrhea. Pt diagnosed with CAP.    PT Comments    Patient is progressing toward mobility goals but continues to demo unsteadiness with ambulation. SpO2 97% on RA before mobility and 90-93% with mobility. Pt educated on pursed lip breathing. Current plan remains appropriate.   Follow Up Recommendations  SNF;Supervision for mobility/OOB     Equipment Recommendations  None recommended by PT    Recommendations for Other Services       Precautions / Restrictions Precautions Precautions: Fall Precaution Comments: BLIND Restrictions Weight Bearing Restrictions: No    Mobility  Bed Mobility               General bed mobility comments: OOB in chair upon arrival  Transfers Overall transfer level: Needs assistance Equipment used: Straight cane (red tipped blind cane) Transfers: Sit to/from Stand Sit to Stand: Min assist         General transfer comment: assist to gain balance upon standing  Ambulation/Gait Ambulation/Gait assistance: Min assist Ambulation Distance (Feet): 150 Feet Assistive device: Straight cane (red tipped blind cane) Gait Pattern/deviations: Step-through pattern;Decreased stride length Gait velocity: decreased   General Gait Details: pt required verbal and tactile cues for guidance and directions and assist for balance with pt unsteady and with 2 LOB; pt with c/o tingling in R LE around ankle during ambulation that subsided before returning to recliner;  SpO2 remained 90-93% on RA with mobility and 97 % on RA before mobility   Stairs            Wheelchair Mobility    Modified Rankin (Stroke Patients Only)       Balance Overall balance assessment: Needs assistance Sitting-balance support: Feet supported;No upper extremity supported Sitting balance-Leahy Scale: Good     Standing balance support: During functional activity;Single extremity supported Standing balance-Leahy Scale: Fair Standing balance comment: unsteady                    Cognition Arousal/Alertness: Awake/alert Behavior During Therapy: WFL for tasks assessed/performed Overall Cognitive Status: Within Functional Limits for tasks assessed                      Exercises      General Comments        Pertinent Vitals/Pain Pain Assessment: Faces Faces Pain Scale: Hurts little more Pain Location: R knee and calf Pain Descriptors / Indicators: Sore Pain Intervention(s): Limited activity within patient's tolerance;Monitored during session;Repositioned    Home Living                      Prior Function            PT Goals (current goals can now be found in the care plan section) Acute Rehab PT Goals Patient Stated Goal: Agreeable to short term SNF rehab Progress towards PT goals: Progressing toward goals    Frequency  Min 3X/week    PT Plan Current plan remains appropriate    Co-evaluation  End of Session Equipment Utilized During Treatment: Gait belt Activity Tolerance: Patient tolerated treatment well Patient left: in chair;with call bell/phone within reach;with chair alarm set     Time: 1126-1150 PT Time Calculation (min) (ACUTE ONLY): 24 min  Charges:  $Gait Training: 8-22 mins $Therapeutic Activity: 8-22 mins                    G Codes:      Salina April, PTA Pager: [phone]   09/21/2015, 12:07 PM

## 2015-10-29 ENCOUNTER — Other Ambulatory Visit: Payer: Self-pay | Admitting: Internal Medicine

## 2015-10-29 DIAGNOSIS — M5432 Sciatica, left side: Principal | ICD-10-CM

## 2015-10-29 DIAGNOSIS — M5431 Sciatica, right side: Secondary | ICD-10-CM

## 2015-10-30 ENCOUNTER — Other Ambulatory Visit: Payer: Self-pay | Admitting: Internal Medicine

## 2015-10-30 DIAGNOSIS — M5432 Sciatica, left side: Principal | ICD-10-CM

## 2015-10-30 DIAGNOSIS — M5431 Sciatica, right side: Secondary | ICD-10-CM

## 2015-11-07 ENCOUNTER — Ambulatory Visit
Admission: RE | Admit: 2015-11-07 | Discharge: 2015-11-07 | Disposition: A | Discharge status: Home / Self Care | Payer: Medicare Other | Source: Ambulatory Visit | Attending: Internal Medicine | Admitting: Internal Medicine

## 2015-11-07 DIAGNOSIS — M5431 Sciatica, right side: Secondary | ICD-10-CM

## 2015-11-07 DIAGNOSIS — M5432 Sciatica, left side: Principal | ICD-10-CM

## 2015-11-13 ENCOUNTER — Encounter: Payer: Self-pay | Admitting: Podiatry

## 2015-11-13 ENCOUNTER — Ambulatory Visit (INDEPENDENT_AMBULATORY_CARE_PROVIDER_SITE_OTHER): Payer: Medicare Other | Admitting: Podiatry

## 2015-11-13 DIAGNOSIS — B351 Tinea unguium: Secondary | ICD-10-CM | POA: Diagnosis not present

## 2015-11-13 DIAGNOSIS — M79676 Pain in unspecified toe(s): Secondary | ICD-10-CM | POA: Diagnosis not present

## 2015-11-13 DIAGNOSIS — Z794 Long term (current) use of insulin: Secondary | ICD-10-CM

## 2015-11-13 DIAGNOSIS — E1140 Type 2 diabetes mellitus with diabetic neuropathy, unspecified: Secondary | ICD-10-CM | POA: Diagnosis not present

## 2015-11-13 NOTE — Progress Notes (Signed)
Patient ID: Amy [name], female   DOB: 11/07/1960, 54 y.o.   MRN: [id number] Complaint:  Visit Type: Patient returns to my office for continued preventative foot care services. Complaint: Patient states" my nails have grown long and thick and become painful to walk and wear shoes" Patient has been diagnosed with DM with neuropathy.. The patient presents for preventative foot care services. No changes to ROS  Podiatric Exam: Vascular: dorsalis pedis and posterior tibial pulses are palpable bilateral. Capillary return is immediate. Temperature gradient is WNL. Skin turgor WNL  Sensorium: Normal Semmes Weinstein monofilament test. Normal tactile sensation bilaterally. Nail Exam: Pt has thick disfigured discolored nails with subungual debris noted bilateral entire nail hallux through fifth toenails Ulcer Exam: There is no evidence of ulcer or pre-ulcerative changes or infection. Orthopedic Exam: Muscle tone and strength are WNL. No limitations in general ROM. No crepitus or effusions noted. Foot type and digits show no abnormalities. Bony prominences are unremarkable. Skin: No Porokeratosis. No infection or ulcers.  Asymptomatic plantar fibroma left arch.  Diagnosis:  Onychomycosis, , Pain in right toe, pain in left toes  Treatment & Plan Procedures and Treatment: Consent by patient was obtained for treatment procedures. The patient understood the discussion of treatment and procedures well. All questions were answered thoroughly reviewed. Debridement of mycotic and hypertrophic toenails, 1 through 5 bilateral and clearing of subungual debris. No ulceration, no infection noted.  Return Visit-Office Procedure: Patient instructed to return to the office for a follow up visit 10 weeks  for continued evaluation and treatment.    Promiss Labarbera DPM 

## 2015-12-06 IMAGING — CR DG CHEST 1V PORT
1 series · 1 of 1 positions shown · non-contrast
Comparison: Portable exam 7223 hr compared to 05/18/2013

CLINICAL DATA: Chest pain, dizziness, shortness of breath, with
neck, back and arm pain, history asthma, diabetes, hypertension

EXAM:
PORTABLE CHEST - 1 VIEW

[view not recorded]
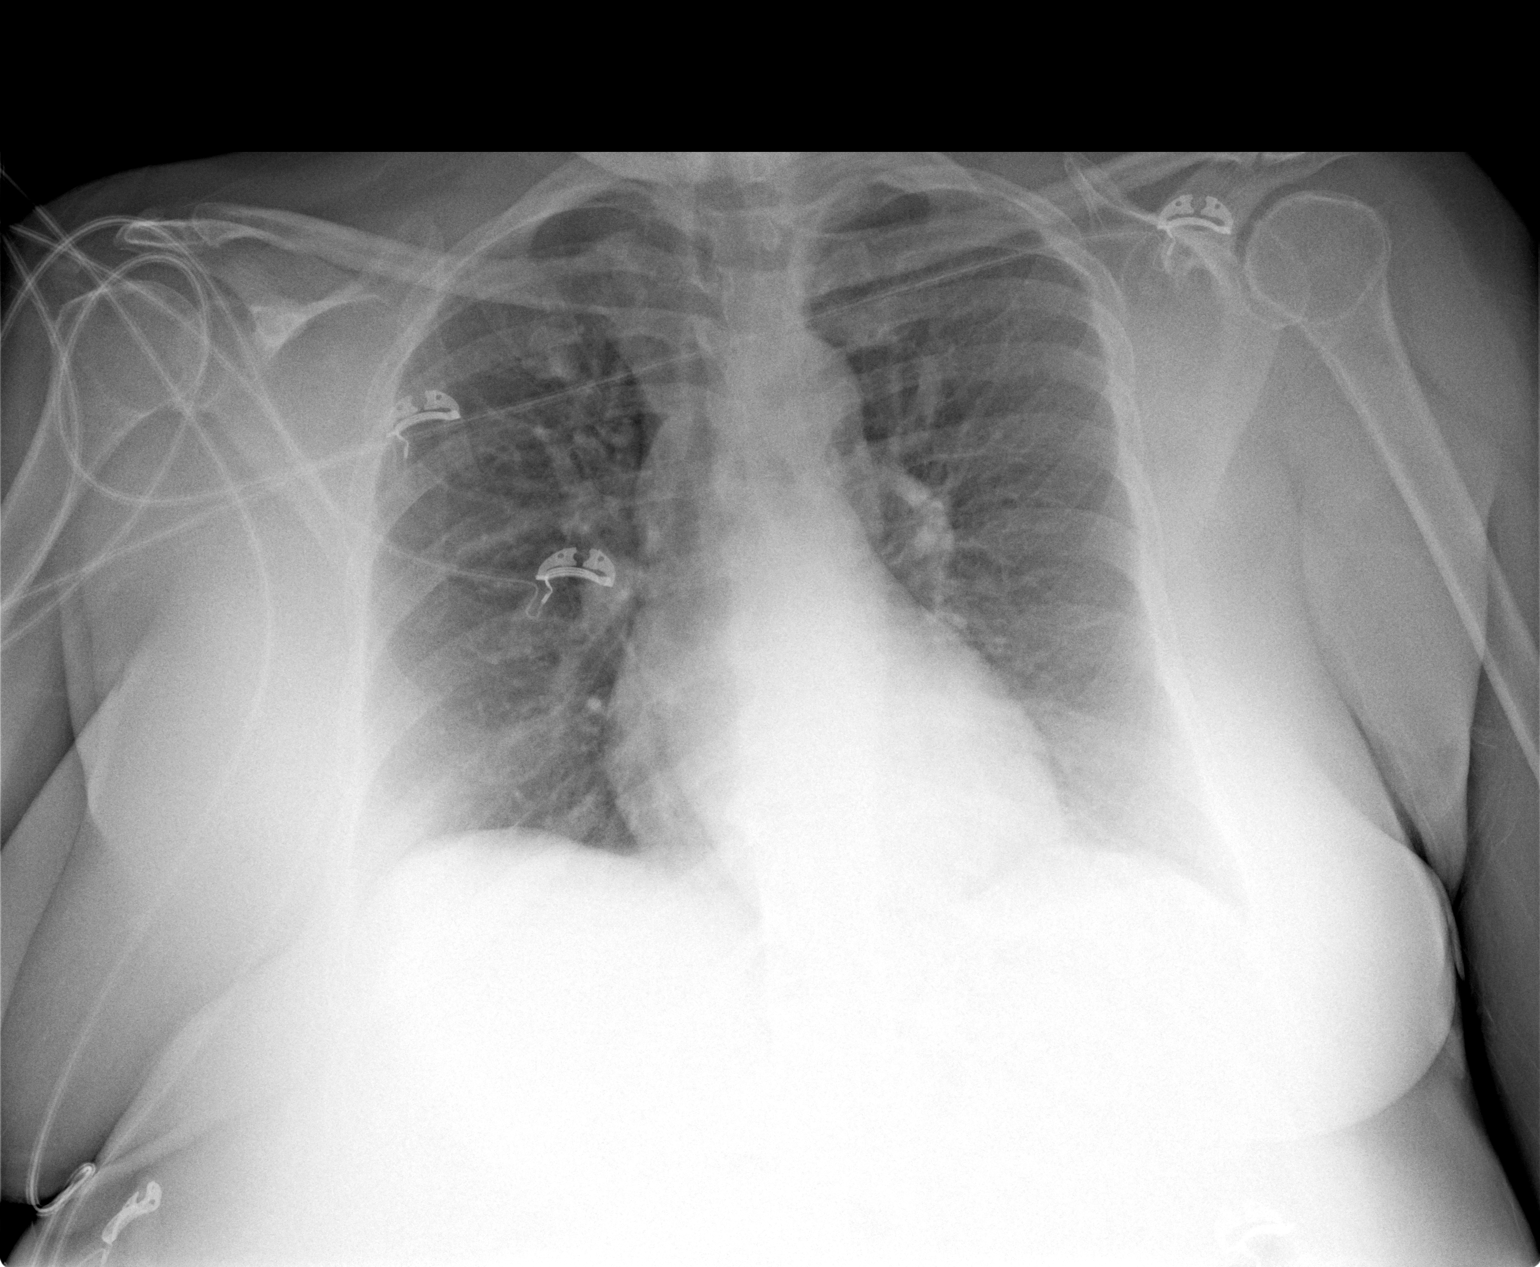

[1 of 1 positions shown; findings below may reference images not displayed]

FINDINGS: Normal heart size, mediastinal contours and pulmonary vascularity.

Lungs clear.

No pleural effusion or pneumothorax.

Bones unremarkable.
IMPRESSION: No acute abnormalities.

## 2015-12-19 ENCOUNTER — Emergency Department (HOSPITAL_COMMUNITY): Payer: Medicare Other

## 2015-12-19 ENCOUNTER — Emergency Department (HOSPITAL_COMMUNITY)
Admission: EM | Admit: 2015-12-19 | Discharge: 2015-12-19 | Disposition: A | Discharge status: Home / Self Care | Payer: Medicare Other | Attending: Emergency Medicine | Admitting: Emergency Medicine

## 2015-12-19 ENCOUNTER — Encounter (HOSPITAL_COMMUNITY): Payer: Self-pay

## 2015-12-19 DIAGNOSIS — Z794 Long term (current) use of insulin: Secondary | ICD-10-CM | POA: Insufficient documentation

## 2015-12-19 DIAGNOSIS — Z79899 Other long term (current) drug therapy: Secondary | ICD-10-CM | POA: Diagnosis not present

## 2015-12-19 DIAGNOSIS — Z7982 Long term (current) use of aspirin: Secondary | ICD-10-CM | POA: Diagnosis not present

## 2015-12-19 DIAGNOSIS — Y929 Unspecified place or not applicable: Secondary | ICD-10-CM | POA: Insufficient documentation

## 2015-12-19 DIAGNOSIS — J45909 Unspecified asthma, uncomplicated: Secondary | ICD-10-CM | POA: Insufficient documentation

## 2015-12-19 DIAGNOSIS — Y999 Unspecified external cause status: Secondary | ICD-10-CM | POA: Insufficient documentation

## 2015-12-19 DIAGNOSIS — Z87891 Personal history of nicotine dependence: Secondary | ICD-10-CM | POA: Insufficient documentation

## 2015-12-19 DIAGNOSIS — Y939 Activity, unspecified: Secondary | ICD-10-CM | POA: Diagnosis not present

## 2015-12-19 DIAGNOSIS — X509XXA Other and unspecified overexertion or strenuous movements or postures, initial encounter: Secondary | ICD-10-CM | POA: Diagnosis not present

## 2015-12-19 DIAGNOSIS — I10 Essential (primary) hypertension: Secondary | ICD-10-CM | POA: Insufficient documentation

## 2015-12-19 DIAGNOSIS — E039 Hypothyroidism, unspecified: Secondary | ICD-10-CM | POA: Insufficient documentation

## 2015-12-19 DIAGNOSIS — M25561 Pain in right knee: Secondary | ICD-10-CM | POA: Diagnosis present

## 2015-12-19 DIAGNOSIS — E1140 Type 2 diabetes mellitus with diabetic neuropathy, unspecified: Secondary | ICD-10-CM | POA: Insufficient documentation

## 2015-12-19 DIAGNOSIS — M542 Cervicalgia: Secondary | ICD-10-CM | POA: Diagnosis not present

## 2015-12-19 MED ORDER — TRAMADOL HCL 50 MG PO TABS
50.0000 mg | ORAL_TABLET | Freq: Once | ORAL | Status: AC
  Administered 2015-12-19: 50 mg via ORAL
  Filled 2015-12-19: qty 1

## 2015-12-19 MED ORDER — TRAMADOL HCL 50 MG PO TABS: 50.0000 mg | ORAL_TABLET | Freq: Every evening | ORAL | 0 refills | Status: DC | PRN

## 2015-12-19 NOTE — ED Notes (Signed)
J Ward, PA, in w/pt.

## 2015-12-19 NOTE — Progress Notes (Signed)
Orthopedic Tech Progress Note Patient Details:  Amy [name] 1960/11/29 [name]  Ortho Devices Type of Ortho Device: Knee Immobilizer Ortho Device/Splint Location: rle Ortho Device/Splint Interventions: Application   Verneda Hollopeter 12/19/2015, 1:01 PM

## 2015-12-19 NOTE — ED Notes (Signed)
Pt returned from xray

## 2015-12-19 NOTE — ED Notes (Signed)
Paging Ortho Tech.

## 2015-12-19 NOTE — ED Provider Notes (Signed)
[facility] DEPT Provider Note   CSN: [id number] Arrival date & time: 12/19/15  [id number]  By signing my name below, I, Irene Pap, attest that this documentation has been prepared under the direction and in the presence of [facility], PA-C. Electronically Signed: Irene Pap, ED Scribe. 12/19/15. 10:32 AM.  First Provider Contact:  First MD Initiated Contact with Patient 12/19/15 1014     History   Chief Complaint Chief Complaint  Patient presents with  . Knee Pain   The history is provided by the patient and medical records. No language interpreter was used.  HPI Comments: Amy [name] is a 55 y.o. Female with a hx of HTN, DM, arthritis, diabetic neuropathy, and spinal stenosis who presents to the Emergency Department complaining of gradually worsening right knee pain onset one day ago. Pt reports that she was bending down to grab something on the floor when she felt her knee pop and twist. She reports the worst pain to the medial aspect of the knee. She reports associated swelling to the knee. She reports worsening pain with ambulation. She regularly ambulates without assistance, but has been walking with a cane due to pain. Pt is also complaining of posterior neck pain following a mechanical fall yesterday when she landed on her buttocks. Pt took Tylenol for pain with no relief. She denies hitting head, LOC, numbness or weakness. Pt is not on blood thinners. Pt has not seen orthopedist in the past, but PCP recently referred her and she has an appointment scheduled.   Past Medical History:  Diagnosis Date  . Asthma   . Blind   . Blindness and low vision    left eye glass eye,  legally blind in right eye  . Diabetes mellitus   . Diabetic neuropathy ([place])   . Hyperlipidemia   . Hypertension   . Hypothyroidism   . Spinal stenosis     Patient Active Problem List   Diagnosis Date Noted  . SOB (shortness of breath)   . CAP (community acquired pneumonia) 09/12/2015  . Hypoxia  09/12/2015  . Hypertension 07/05/2015  . Hypotension 07/05/2015  . Diabetes mellitus with neurological manifestations ([place]) 11/04/2014  . Hyperlipidemia LDL goal <70 11/04/2014  . Spinal stenosis, multilevel 11/04/2014  . Hyperkalemia 11/01/2014  . Hypoglycemia 08/09/2014  . DM (diabetes mellitus), type 2, uncontrolled, with hyperosmolarity ([place]) 08/08/2014  . Elevated troponin 08/08/2014  . Nausea vomiting and diarrhea 08/08/2014  . Dizziness 08/06/2012  . Orthostatic hypotension 08/06/2012  . Hypernatremia 08/06/2012  . Acute gastroenteritis 08/13/2011  . Gastroparesis 08/13/2011  . Chest pain 08/13/2011  . DM (diabetes mellitus) ([place]) 08/13/2011  . Oral thrush 08/13/2011  . Gastroenteritis 05/23/2011  . Hypotension 05/23/2011  . Dehydration 05/23/2011  . Blindness 05/23/2011  . DM type 1, not at goal, causing eye disease ([place]) 05/23/2011  . Asthma 05/23/2011  . Diarrhea 05/23/2011  . Vomiting 05/23/2011  . Hypothyroidism 05/23/2011  . Diabetic neuropathy ([place]) 05/23/2011  . Diabetic nephropathy ([place]) 05/23/2011    Past Surgical History:  Procedure Laterality Date  . ABDOMINAL HYSTERECTOMY    . CESAREAN SECTION     x 2  . CHOLECYSTECTOMY    . ENUCLEATION      OB History    Obstetric Comments   Pt has two sons.       Home Medications    Prior to Admission medications   Medication Sig Start Date End Date Taking? Authorizing Provider  albuterol (PROVENTIL HFA;VENTOLIN HFA) 108 (90 BASE) MCG/ACT  inhaler Inhale 2 puffs into the lungs every 4 (four) hours as needed for wheezing or shortness of breath. For short    Historical Provider, MD  aspirin EC 81 MG tablet Take 81 mg by mouth daily.    Historical Provider, MD  benzonatate (TESSALON) 200 MG capsule Take 1 capsule (200 mg total) by mouth 2 (two) times daily as needed for cough. 09/21/15   Eugenie Filler, MD  clobetasol ointment (TEMOVATE) [id number] % Apply 1 application topically 2 (two) times daily as needed  (dermatosis).  06/05/15   Historical Provider, MD  diclofenac sodium (VOLTAREN) 1 % GEL Apply 2 g topically every 6 (six) hours as needed (for pain). 11/03/14   Theodis Blaze, MD  diphenoxylate-atropine (LOMOTIL) 2.5-0.025 MG tablet Take 1 tablet by mouth 4 (four) times daily as needed for diarrhea or loose stools. 09/21/15   Eugenie Filler, MD  Dulaglutide (TRULICITY [place]) Inject 1.7 mLs into the skin once a week.     Historical Provider, MD  esomeprazole (NEXIUM) 40 MG capsule Take 40 mg by mouth 2 (two) times daily before a meal.     Historical Provider, MD  fluorometholone (FML FORTE) 0.25 % ophthalmic suspension Place 1 drop into the right eye 2 (two) times daily.    Historical Provider, MD  fluticasone (FLONASE) 50 MCG/ACT nasal spray Place 2 sprays into both nostrils daily. Use for 5 days then stop. 09/21/15   Eugenie Filler, MD  furosemide (LASIX) 20 MG tablet Take 20 mg by mouth daily.  09/03/15   Historical Provider, MD  guaiFENesin (MUCINEX) 600 MG 12 hr tablet Take 2 tablets (1,200 mg total) by mouth 2 (two) times daily. Take for 5 days then stop. 09/21/15   Eugenie Filler, MD  HUMALOG KWIKPEN 100 UNIT/ML KiwkPen Inject 8-14 Units into the skin 3 (three) times daily. 8 units with breakfast 10 units with lunch and 14 units with dinner 12/26/13   Historical Provider, MD  ipratropium-albuterol (DUONEB) 0.5-2.5 (3) MG/3ML SOLN Take 3 mLs by nebulization 3 (three) times daily. Use for 5 days then use every 6 hours as needed, for SOB, wheezing. 09/21/15   Eugenie Filler, MD  irbesartan (AVAPRO) 75 MG tablet Take 75 mg by mouth daily. 09/03/15   Historical Provider, MD  LEVEMIR FLEXTOUCH 100 UNIT/ML Pen Inject 45 Units into the skin every evening. 09/21/15   Eugenie Filler, MD  levothyroxine (SYNTHROID, LEVOTHROID) 150 MCG tablet Take 150 mcg by mouth daily. 10/28/14   Historical Provider, MD  lidocaine (LIDODERM) 5 % Place 1 patch onto the skin daily as needed (pain). Remove & Discard patch within  12 hours or as directed by MD 09/21/15   Eugenie Filler, MD  Linaclotide [facility]) 145 MCG CAPS capsule Take 145 mcg by mouth daily as needed (constipation).     Historical Provider, MD  loratadine (CLARITIN) 10 MG tablet Take 1 tablet (10 mg total) by mouth daily. Take for 5 days, then stop. 09/21/15   Eugenie Filler, MD  LYRICA 150 MG capsule Take 150 mg by mouth 3 (three) times daily. 10/28/14   Historical Provider, MD  metoprolol succinate (TOPROL-XL) 25 MG 24 hr tablet Take 25 mg by mouth 2 (two) times daily. 09/03/15   Historical Provider, MD  midodrine (PROAMATINE) 10 MG tablet Take 10 mg by mouth 3 (three) times daily.  05/12/15   Historical Provider, MD  montelukast (SINGULAIR) 10 MG tablet Take 10 mg by mouth at bedtime.  Historical Provider, MD  neomycin-polymyxin b-dexamethasone (MAXITROL) 3.5-10000-0.1 OINT Use 1/4" ribbon to affected eye once a day as needed for antibiotic. If you develop allergy symptoms, stop & call our office. 05/13/15   Historical Provider, MD  predniSONE (DELTASONE) 20 MG tablet Take 1-2 tablets (20-40 mg total) by mouth daily with breakfast. Take 2 tablets (40mg ) daily x 3 days, then 1 tablet (20mg ) daily x 3 days then stop. 09/21/15   Eugenie Filler, MD  saccharomyces boulardii (FLORASTOR) 250 MG capsule Take 1 capsule (250 mg total) by mouth 2 (two) times daily. 09/21/15   Eugenie Filler, MD  simvastatin (ZOCOR) 20 MG tablet Take 20 mg by mouth at bedtime.     Historical Provider, MD  traMADol (ULTRAM) 50 MG tablet Take 1 tablet (50 mg total) by mouth at bedtime as needed for severe pain. 12/19/15   [place], PA-C    Family History Family History  Problem Relation Age of Onset  . Diabetes Sister     Social History Social History  Substance Use Topics  . Smoking status: Former Smoker    Quit date: 05/22/1992  . Smokeless tobacco: Never Used  . Alcohol use No     Allergies   Codeine; Iodine-131; Iohexol; Penicillins; and Sulfa  antibiotics   Review of Systems Review of Systems  Respiratory: Negative for shortness of breath.   Cardiovascular: Negative for chest pain.  Gastrointestinal: Negative for abdominal pain.  Musculoskeletal: Positive for arthralgias, joint swelling and neck pain.  Neurological: Negative for syncope, weakness and numbness.  All other systems reviewed and are negative.    Physical Exam Updated Vital Signs BP 147/87 (BP Location: Right Arm)   Pulse 75   Temp 98.1 F (36.7 C) (Oral)   Resp 18   SpO2 100%   Physical Exam  Constitutional: She is oriented to person, place, and time. She appears well-developed and well-nourished. No distress.  HENT:  Head: Normocephalic and atraumatic.  Neck:  TTP of midline and paraspinal musculature. Full ROM. No bony stepoffs or deformities. No bruising, erythema, or swelling.  Cardiovascular: Normal rate, regular rhythm, normal heart sounds and intact distal pulses.   Pulmonary/Chest: Effort normal and breath sounds normal. No respiratory distress. She exhibits no tenderness.  Abdominal: Soft. She exhibits no distension. There is no tenderness.  Musculoskeletal:       Right knee: Tenderness found. No medial joint line tenderness noted.  Right knee: No gross deformity noted. Mild swelling appreciated. Knee with full ROM. TTP of medial aspect of knee - no joint line tenderness.  No abnormal alignment or patellar mobility. No bruising, erythema, or warmth overlaying the joint. Mild amount of laxity with valgus stress test compared to left. Negative drawer's, negative Lachman's, negative McMurray's, no crepitus. 2+ DP pulses bilaterally. All compartments are soft. Sensation intact distal to injury. Pelvis stable but tenderness to palpation of left anterior iliac spine/crest. No midline spinal tenderness. 5/5 muscle strength in all 4 extremities including grip strength.   Neurological: She is alert and oriented to person, place, and time.  Skin: Skin is  warm and dry.  Nursing note and vitals reviewed.    ED Treatments / Results  DIAGNOSTIC STUDIES: Oxygen Saturation is 95% on RA, normal by my interpretation.    COORDINATION OF CARE: 10:29 AM-Discussed treatment plan which includes x-rays with pt at bedside and pt agreed to plan.    Labs (all labs ordered are listed, but only abnormal results are displayed) Labs Reviewed -  No data to display  EKG  EKG Interpretation None       Radiology Dg Cervical Spine Complete  Result Date: 12/19/2015 CLINICAL DATA:  Status post fall. EXAM: CERVICAL SPINE - COMPLETE 4+ VIEW COMPARISON:  CT scan April 27, 2008 FINDINGS: Mild prominence of the prevertebral soft tissues anterior to C1 and C2 is unchanged based on the scout view from the December 2009 CT scan. The pre odontoid space is normal. Severe degenerative changes are seen most marked at C3-4 and C4-5. No traumatic malalignment. No obvious fracture. Narrowing of multiple neural foramina identified on oblique views. The lateral masses of C1 align with C2. The odontoid process is unremarkable. The upper chest is normal as well. IMPRESSION: 1. There is soft tissue thickening anterior to C1 and C2 but this appears unchanged since December of 2009. There are severe multilevel degenerative changes. Evaluation for subtle fractures is likely reduced in this patient. If there is continued concern, a CT scan would be much more sensitive and specific. Electronically Signed   By: Dorise Bullion III M.D   On: 12/19/2015 12:00   Dg Knee Complete 4 Views Right  Result Date: 12/19/2015 CLINICAL DATA:  Fall yesterday, right knee pain. EXAM: RIGHT KNEE - COMPLETE 4+ VIEW COMPARISON:  None. FINDINGS: Osseous alignment is normal. Bone mineralization is normal. No fracture line or displaced fracture fragment seen. No degenerative change. No appreciable joint effusion. Atherosclerotic calcifications noted within the popliteal fossa. Adjacent soft tissues otherwise  unremarkable. IMPRESSION: 1. No acute findings. No osseous fracture or dislocation. No appreciable joint effusion. 2. Atherosclerosis. Electronically Signed   By: Franki Cabot M.D.   On: 12/19/2015 11:56   Dg Hip Unilat W Or Wo Pelvis 2-3 Views Left  Result Date: 12/19/2015 CLINICAL DATA:  Fall yesterday onto her bottom, patient is having pain in her right knee when bending and bearing weight. Pain on the lateral side of her left hip. Pain when turning her neck in any direction. EXAM: DG HIP (WITH OR WITHOUT PELVIS) 2-3V LEFT COMPARISON:  None. FINDINGS: No pelvic fracture sacral fracture. Dedicated view of the LEFT hip demonstrates no femoral neck vascular calcifications in IMPRESSION: No pelvic fracture or LEFT hip fracture Electronically Signed   By: Suzy Bouchard M.D.   On: 12/19/2015 11:57    Procedures Procedures (including critical care time)  Medications Ordered in ED Medications  traMADol (ULTRAM) tablet 50 mg (50 mg Oral Given 12/19/15 1120)     Initial Impression / Assessment and Plan / ED Course  I have reviewed the triage vital signs and the nursing notes.  Pertinent labs & imaging results that were available during my care of the patient were reviewed by me and considered in my medical decision making (see chart for details).  Clinical Course   ANDRYA KUSNIERZ presents to ED for right knee pain x 1 day and neck pain after mechanical fall 1 day ago. No head injury or LOC. Not on anticoagulation. On exam, patient is afebrile and hemodynamically stable. She does have tenderness to palpation of both midline and paraspinal musculature of C-spine, much more paraspinal > midline: x-ray ordered and reviewed. Knee with very mild laxity of MCL when compared to left. X-rays unremarkable. Patient placed in knee immobilizer and will follow up with ortho in 1 week. Symptomatic home care instructions and follow up care discussed with patient, as well as caregiver at the bedside. Return  precautions discussed and all questions answered.   Final Clinical Impressions(s) /  ED Diagnoses   Final diagnoses:  Right knee pain    New Prescriptions Discharge Medication List as of 12/19/2015  1:01 PM    START taking these medications   Details  traMADol (ULTRAM) 50 MG tablet Take 1 tablet (50 mg total) by mouth at bedtime as needed for severe pain., Starting Sat 12/19/2015, Print         [employer] Elon Lomeli, PA-C 12/19/15 [place], MD 12/20/15 [phone]

## 2015-12-19 NOTE — Discharge Instructions (Signed)
Please call the orthopedist listed to schedule a follow up appointment for next week. Wear knee immobilizer whenever ambulating. Ice, elevating your knee, and rest will be VERY beneficial. Return to ER for new or worsening symptoms, any additional concerns.   COLD THERAPY DIRECTIONS:  Ice or gel packs can be used to reduce both pain and swelling. Ice is the most helpful within the first 24 to 48 hours after an injury or flareup from overusing a muscle or joint.  Ice is effective, has very few side effects, and is safe for most people to use.   If you expose your skin to cold temperatures for too long or without the proper protection, you can damage your skin or nerves. Watch for signs of skin damage due to cold.   HOME CARE INSTRUCTIONS  Follow these tips to use ice and cold packs safely.  Place a dry or damp towel between the ice and skin. A damp towel will cool the skin more quickly, so you may need to shorten the time that the ice is used.  For a more rapid response, add gentle compression to the ice.  Ice for no more than 10 to 20 minutes at a time. The bonier the area you are icing, the less time it will take to get the benefits of ice.  Check your skin after 5 minutes to make sure there are no signs of a poor response to cold or skin damage.  Rest 20 minutes or more in between uses.  Once your skin is numb, you can end your treatment. You can test numbness by very lightly touching your skin. The touch should be so light that you do not see the skin dimple from the pressure of your fingertip. When using ice, most people will feel these normal sensations in this order: cold, burning, aching, and numbness.

## 2015-12-19 NOTE — ED Notes (Signed)
Pt in xray

## 2015-12-19 NOTE — ED Notes (Signed)
Ortho Tech in w/pt.

## 2015-12-19 NOTE — ED Triage Notes (Signed)
Pt. Reports was doing something while bending her knees and felt her rt. Knee pop.  She is having pain to the knee with mild swelling.  Pt. Is able to walk on the knee.  He also slipped down 2 steps on her buttocks and she is having posterior neck pain.  She has ROM but pain with.

## 2015-12-19 NOTE — ED Notes (Addendum)
CNA w/pt. Warm blanket given to pt as requested.

## 2016-01-22 ENCOUNTER — Encounter: Payer: Self-pay | Admitting: *Deleted

## 2016-01-25 ENCOUNTER — Encounter: Payer: Self-pay | Admitting: Diagnostic Neuroimaging

## 2016-01-25 ENCOUNTER — Ambulatory Visit (INDEPENDENT_AMBULATORY_CARE_PROVIDER_SITE_OTHER): Payer: Medicare Other | Admitting: Diagnostic Neuroimaging

## 2016-01-25 VITALS — Ht 59.0 in | Wt 193.0 lb

## 2016-01-25 DIAGNOSIS — I951 Orthostatic hypotension: Secondary | ICD-10-CM | POA: Diagnosis not present

## 2016-01-25 DIAGNOSIS — E1143 Type 2 diabetes mellitus with diabetic autonomic (poly)neuropathy: Secondary | ICD-10-CM | POA: Diagnosis not present

## 2016-01-25 NOTE — Patient Instructions (Signed)
Thank you for coming to see [place] at [facility]. I hope we have been able to provide you high quality care today.  You may receive a patient satisfaction survey over the next few weeks. We would appreciate your feedback and comments so that we may continue to improve ourselves and the health of our patients.  - continue diabetes care - continue midodrine   ~~~~~~~~~~~~~~~~~~~~~~~~~~~~~~~~~~~~~~~~~~~~~~~~~~~~~~~~~~~~~~~~~  DR. Elis Sauber'S GUIDE TO HAPPY AND HEALTHY LIVING These are some of my general health and wellness recommendations. Some of them may apply to you better than others. Please use common sense as you try these suggestions and feel free to ask me any questions.   ACTIVITY/FITNESS Mental, social, emotional and physical stimulation are very important for brain and body health. Try learning a new activity (arts, music, language, sports, games).  Keep moving your body to the best of your abilities. You can do this at home, inside or outside, the park, community center, gym or anywhere you like. Consider a physical therapist or personal trainer to get started. Consider the app Sworkit. Fitness trackers such as smart-watches, smart-phones or Fitbits can help as well.   NUTRITION Eat more plants: colorful vegetables, nuts, seeds and berries.  Eat less sugar, salt, preservatives and processed foods.  Avoid toxins such as cigarettes and alcohol.  Drink water when you are thirsty. Warm water with a slice of lemon is an excellent morning drink to start the day.  Consider these websites for more information The Nutrition Source (https://www.henry-hernandez.biz/) Prec[employer] Nutrition (WindowBlog.ch)   RELAXATION Consider practicing mindfulness meditation or other relaxation techniques such as deep breathing, prayer, yoga, tai chi, massage. See website mindful.org or the apps Headspace or Calm to help get  started.   SLEEP Try to get at least 7-8+ hours sleep per day. Regular exercise and reduced caffeine will help you sleep better. Practice good sleep hygeine techniques. See website sleep.org for more information.   PLANNING Prepare estate planning, living will, healthcare POA documents. Sometimes this is best planned with the help of an attorney. Theconversationproject.org and agingwithdignity.org are excellent resources.

## 2016-01-25 NOTE — Progress Notes (Signed)
[facility]  [name] DOB: [birth year]  REFERRING CLINICIAN: S Holwerda HISTORY FROM: patient and CNA REASON FOR VISIT: new consult    HISTORICAL  CHIEF COMPLAINT:  Chief Complaint  Patient presents with  . 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  Patient diagnosed with diabetes in 2000 with hemoglobin A1c in the 11-12 range. Patient has significant complications from diabetes including vision loss, multiple eye surgeries, kidney disease and neuropathy. Over the past one year patient has had increasing problems with orthostatic hypotension. Patient has had significant lightheadedness and near passing out. She is not fully passed out. Patient has been working with her PCP and cardiology to manage blood pressure control. Patient currently on Midrin to help with blood pressure support. Patient referred to me for consideration of other neurologic or neurogenic causes of orthostatic hypotension problems.  Patient denies any tremor, gait difficulty, slurred speech or trouble swallowing. No family history of Parkinson's disease.   REVIEW OF SYSTEMS: Full 14 system review of systems performed and negative with exception of: Weight gain swelling in legs hearing loss ringing in ears loss of vision shortness of breath snoring incontinence diarrhea constipation aching muscles increased thirst feeling hot numbness weakness.   ALLERGIES: Allergies  Allergen Reactions  . Codeine Nausea Only  . Iodine-131 Other (See Comments)    hives  . Iohexol      Code: HIVES, Desc: pt developed itching and hives along with nasal congestion; needs 13 hour premeds for future studies, Onset Date:  [id number]   . Penicillins Hives    Has patient had a PCN reaction causing immediate rash, facial/tongue/throat swelling, SOB or lightheadedness with hypotension: unknown Has patient had a PCN reaction causing severe rash involving mucus membranes or skin necrosis:unknown Has patient had a PCN reaction that required hospitalization unknown Has patient had a PCN reaction occurring within the last 10 years:unknown If all of the above answers are "NO", then may proceed with Cephalosporin use.   . Sulfa Antibiotics Nausea And Vomiting    HOME MEDICATIONS: Outpatient Medications Prior to Visit  Medication Sig Dispense Refill  . clobetasol ointment (TEMOVATE) 3.55 % Apply 1 application topically 2 (two) times daily as needed (dermatosis).     . Dulaglutide (TRULICITY [place]) Inject 1.7 mLs into the skin once a week.     . esomeprazole (NEXIUM) 40 MG capsule Take 40 mg by mouth 2 (two) times daily before a meal.     . fluorometholone (FML FORTE) 0.25 % ophthalmic suspension Place 1 drop into the right eye 2 (two) times daily.    Marland Kitchen HUMALOG KWIKPEN 100 UNIT/ML KiwkPen Inject 8-14 Units into the skin 3 (three) times daily. 8 units with breakfast 10 units with lunch and 14 units with dinner    . irbesartan (AVAPRO) 75 MG tablet Take 75 mg by mouth daily.    Marland Kitchen LEVEMIR FLEXTOUCH 100 UNIT/ML Pen Inject 45 Units into the skin every evening. 15 mL 0  . levothyroxine (SYNTHROID, LEVOTHROID) 150 MCG tablet Take 150 mcg by mouth daily.    . Linaclotide (LINZESS) 145 MCG CAPS capsule Take 145 mcg by mouth daily as needed (constipation).     Marland Kitchen LYRICA 150 MG capsule Take 150 mg by mouth 3 (three) times daily.    Marland Kitchen  midodrine (PROAMATINE) 10 MG tablet Take 10 mg by mouth 3 (three) times daily.     . montelukast (SINGULAIR) 10 MG tablet Take 10 mg by mouth at bedtime.    Marland Kitchen neomycin-polymyxin b-dexamethasone (MAXITROL) 3.5-10000-0.1 OINT Use 1/4" ribbon to affected eye once a day as needed for antibiotic. If you develop  allergy symptoms, stop & call our office.    . simvastatin (ZOCOR) 20 MG tablet Take 20 mg by mouth at bedtime.     Marland Kitchen albuterol (PROVENTIL HFA;VENTOLIN HFA) 108 (90 BASE) MCG/ACT inhaler Inhale 2 puffs into the lungs every 4 (four) hours as needed for wheezing or shortness of breath. For short    . aspirin EC 81 MG tablet Take 81 mg by mouth daily.    . furosemide (LASIX) 20 MG tablet Take 20 mg by mouth daily.     . traMADol (ULTRAM) 50 MG tablet Take 1 tablet (50 mg total) by mouth at bedtime as needed for severe pain. (Patient not taking: Reported on 01/25/2016) 5 tablet 0  . benzonatate (TESSALON) 200 MG capsule Take 1 capsule (200 mg total) by mouth 2 (two) times daily as needed for cough. 20 capsule 0  . diclofenac sodium (VOLTAREN) 1 % GEL Apply 2 g topically every 6 (six) hours as needed (for pain). 1 Tube 0  . diphenoxylate-atropine (LOMOTIL) 2.5-0.025 MG tablet Take 1 tablet by mouth 4 (four) times daily as needed for diarrhea or loose stools. 30 tablet 0  . fluticasone (FLONASE) 50 MCG/ACT nasal spray Place 2 sprays into both nostrils daily. Use for 5 days then stop. 9.9 g 0  . guaiFENesin (MUCINEX) 600 MG 12 hr tablet Take 2 tablets (1,200 mg total) by mouth 2 (two) times daily. Take for 5 days then stop. 20 tablet 0  . ipratropium-albuterol (DUONEB) 0.5-2.5 (3) MG/3ML SOLN Take 3 mLs by nebulization 3 (three) times daily. Use for 5 days then use every 6 hours as needed, for SOB, wheezing. 360 mL 0  . lidocaine (LIDODERM) 5 % Place 1 patch onto the skin daily as needed (pain). Remove & Discard patch within 12 hours or as directed by MD 6 patch 0  . loratadine (CLARITIN) 10 MG tablet Take 1 tablet (10 mg total) by mouth daily. Take for 5 days, then stop.    . metoprolol succinate (TOPROL-XL) 25 MG 24 hr tablet Take 25 mg by mouth 2 (two) times daily.    . predniSONE (DELTASONE) 20 MG tablet Take 1-2 tablets (20-40 mg total) by mouth daily with breakfast. Take 2 tablets (40mg ) daily x 3 days,  then 1 tablet (20mg ) daily x 3 days then stop. 9 tablet 0  . saccharomyces boulardii (FLORASTOR) 250 MG capsule Take 1 capsule (250 mg total) by mouth 2 (two) times daily.     No facility-administered medications prior to visit.     PAST MEDICAL HISTORY: Past Medical History:  Diagnosis Date  . Asthma   . Blind   . Blindness and low vision    left eye glass eye,  legally blind in right eye  . Diabetes mellitus   . Diabetic neuropathy ([place])   . Hyperlipidemia   . Hypertension   . Hypothyroidism   . Spinal stenosis     PAST SURGICAL HISTORY: Past Surgical History:  Procedure Laterality Date  . ABDOMINAL HYSTERECTOMY  1990  . [place]   x 2  . CHOLECYSTECTOMY  2008  . ENUCLEATION Bilateral 09/15/1998  . EYE SURGERY  2016   fitting artificial eye    FAMILY HISTORY: Family History  Problem Relation Age of Onset  . Diabetes Sister   . Glaucoma Sister   . Hypertension Sister   . Stroke Brother   . Heart attack Paternal Aunt     SOCIAL HISTORY:  Social History   Social History  . Marital status: Single    Spouse name: N/A  . Number of children: 2  . Years of education: 12   Occupational History  .      disabled   Social History Main Topics  . Smoking status: Former Smoker    Types: Cigarettes    Quit date: 05/22/1992  . Smokeless tobacco: Never Used  . Alcohol use No     Comment: quit 20 yrs ago  . Drug use: No  . Sexual activity: Not Currently   Other Topics Concern  . Not on file   Social History Narrative   Lives alone   caffeine -occas coffee     PHYSICAL EXAM  GENERAL EXAM/CONSTITUTIONAL: Vitals:  Vitals:   01/25/16 0936  Weight: 193 lb (87.5 kg)  Height: 4\' 11"  (1.499 m)    Orthostatic VS for the past 24 hrs (Last 3 readings):  BP- Lying Pulse- Lying BP- Sitting Pulse- Sitting BP- Standing at 0 minutes Pulse- Standing at 0 minutes  01/25/16 0945 132/79 69 111/79 70 90/64 74    Body mass index is 38.98  kg/m.  Vision Screening Comments: 01/25/16 blind  Patient is in no distress; well developed, nourished and groomed; neck is supple  CARDIOVASCULAR:  Examination of carotid arteries is normal; no carotid bruits  Regular rate and rhythm, no murmurs  Examination of peripheral vascular system by observation and palpation is normal  EYES:  Ophthalmoscopic exam --> LEFT EYE PROSTHESIS; RIGHT EYE CLOUDY AND POST-SURGICAL  MUSCULOSKELETAL:  Gait, strength, tone, movements noted in Neurologic exam below  NEUROLOGIC: MENTAL STATUS:  No flowsheet data found.  awake, alert, oriented to person, place and time  recent and remote memory intact  normal attention and concentration  language fluent, comprehension intact, naming intact,   fund of knowledge appropriate  CRANIAL NERVE:   2nd - LEFT EYE PROSTHESIS; RIGHT EYE CLOUDY AND POST-SURGICAL; BLIND, NO LIGHT PERCEPTION  2nd, 3rd, 4th, 6th - RIGHT PUPIL NO REACTION; LEFT PUPIL PROSTHETIC; NYSTAGMUS OF RIGHT EYE IN ALL DIRECTIONS  5th - facial sensation symmetric  7th - facial strength symmetric  8th - hearing intact  9th - palate elevates symmetrically, uvula midline  11th - shoulder shrug symmetric  12th - tongue protrusion midline  MOTOR:   normal bulk and tone, full strength in the BUE, BLE  SENSORY:   normal and symmetric to light touch, temperature, vibration  COORDINATION:   finger-nose-finger, fine finger movements normal  REFLEXES:   deep tendon reflexes present and symmetric; TRACE AT ANKLES  GAIT/STATION:   narrow based gait; SHORT STEPS    DIAGNOSTIC DATA (LABS, IMAGING, TESTING) - I reviewed patient records, labs, notes, testing and imaging myself where available.  Lab Results  Component Value Date   WBC 15.4 (H) 09/21/2015   HGB 9.7 (L) 09/21/2015   HCT 30.4 (L) 09/21/2015   MCV 79.2 09/21/2015   PLT 239 09/21/2015      Component Value Date/Time   NA 141 09/21/2015 0520   K 4.2  09/21/2015 0520   CL 103 09/21/2015 0520   CO2 28 09/21/2015 0520   GLUCOSE 262 (H) 09/21/2015 0520  BUN 31 (H) 09/21/2015 0520   CREATININE 1.22 (H) 09/21/2015 0520   CREATININE 0.92 10/22/2012 1351   CALCIUM 9.0 09/21/2015 0520   PROT 6.7 09/16/2015 0452   ALBUMIN 2.5 (L) 09/16/2015 0452   AST 26 09/16/2015 0452   ALT 23 09/16/2015 0452   ALKPHOS 81 09/16/2015 0452   BILITOT 0.5 09/16/2015 0452   GFRNONAA 49 (L) 09/21/2015 0520   GFRAA 56 (L) 09/21/2015 0520   Lab Results  Component Value Date   CHOL 170 10/22/2012   HDL 56 10/22/2012   LDLCALC 92 10/22/2012   TRIG 112 10/22/2012   CHOLHDL 3.0 10/22/2012   Lab Results  Component Value Date   HGBA1C 10.1 (H) 11/01/2014   Lab Results  Component Value Date   VITAMINB12 325 05/25/2011   Lab Results  Component Value Date   TSH 5.477 (H) 10/22/2012    08/06/12 MRI brain [I reviewed images myself and agree with interpretation. -VRP]  1.  No acute stroke or definite acute intracranial abnormality. 2.  Confluent signal abnormality in the pons is nonspecific and can be due simply to ordinary chronic small vessel disease, but consider also osmotic demyelination in the appropriate clinical setting. 3.  Supratentorial brain within normal limits for age.  Possible tiny chronic lacunar infarct in the right cerebellum. 4.  Chronic changes to the orbits.  11/07/15 MRI lumbar spine [I reviewed images myself and agree with interpretation. -VRP]  1. Moderate L5-S1 facet arthrosis with mild bilateral neural foraminal narrowing. 2. Mild spondylosis and facet arthrosis elsewhere as above without evidence of impingement.      ASSESSMENT AND PLAN  55 y.o. year old female here with diabetes, hypercholesteremia, degenerative spine disease, here for evaluation of orthostatic hypotension. Most likely represents diabetic autonomic neuropathy. No evidence of neurodegenerative disease to explain orthostatic hypotension; no evidence of  parkinsonism or other CNS pathologies.  Dx:  1. Diabetic autonomic neuropathy associated with type 2 diabetes mellitus ([place])   2. Orthostatic hypotension      PLAN: - continue diabetes care - continue midodrine - follow up with PCP and cardiology for BP mgmt  Return if symptoms worsen or fail to improve, for return to PCP.    Penni Bombard, MD 07/29/4006, 67:61 AM Certified in Neurology, Neurophysiology and Neuroimaging  [facility] [address], [place], [place] [address] [phone]

## 2016-01-29 ENCOUNTER — Ambulatory Visit (INDEPENDENT_AMBULATORY_CARE_PROVIDER_SITE_OTHER): Payer: Medicare Other | Admitting: Podiatry

## 2016-01-29 ENCOUNTER — Encounter: Payer: Self-pay | Admitting: Podiatry

## 2016-01-29 VITALS — Ht 59.0 in | Wt 193.0 lb

## 2016-01-29 DIAGNOSIS — M79676 Pain in unspecified toe(s): Secondary | ICD-10-CM | POA: Diagnosis not present

## 2016-01-29 DIAGNOSIS — B351 Tinea unguium: Secondary | ICD-10-CM

## 2016-01-29 NOTE — Progress Notes (Signed)
Patient ID: Amy [name], female   DOB: 1961/02/26, 55 y.o.   MRN: [id number] Complaint:  Visit Type: Patient returns to my office for continued preventative foot care services. Complaint: Patient states" my nails have grown long and thick and become painful to walk and wear shoes" Patient has been diagnosed with DM with neuropathy.. The patient presents for preventative foot care services. No changes to ROS  Podiatric Exam: Vascular: dorsalis pedis and posterior tibial pulses are palpable bilateral. Capillary return is immediate. Temperature gradient is WNL. Skin turgor WNL  Sensorium: Normal Semmes Weinstein monofilament test. Normal tactile sensation bilaterally. Nail Exam: Pt has thick disfigured discolored nails with subungual debris noted bilateral entire nail hallux through fifth toenails Ulcer Exam: There is no evidence of ulcer or pre-ulcerative changes or infection. Orthopedic Exam: Muscle tone and strength are WNL. No limitations in general ROM. No crepitus or effusions noted. Foot type and digits show no abnormalities. Bony prominences are unremarkable. Skin: No Porokeratosis. No infection or ulcers.  Asymptomatic plantar fibroma left arch.  Diagnosis:  Onychomycosis, , Pain in right toe, pain in left toes  Treatment & Plan Procedures and Treatment: Consent by patient was obtained for treatment procedures. The patient understood the discussion of treatment and procedures well. All questions were answered thoroughly reviewed. Debridement of mycotic and hypertrophic toenails, 1 through 5 bilateral and clearing of subungual debris. No ulceration, no infection noted.  Return Visit-Office Procedure: Patient instructed to return to the office for a follow up visit 10 weeks  for continued evaluation and treatment.    Gardiner Barefoot DPM

## 2016-01-30 ENCOUNTER — Encounter (HOSPITAL_COMMUNITY): Payer: Self-pay

## 2016-01-30 ENCOUNTER — Emergency Department (HOSPITAL_COMMUNITY): Payer: Medicare Other

## 2016-01-30 ENCOUNTER — Observation Stay (HOSPITAL_COMMUNITY)
Admission: EM | Admit: 2016-01-30 | Discharge: 2016-02-01 | Disposition: A | Discharge status: Home / Self Care | Payer: Medicare Other | Attending: Internal Medicine | Admitting: Internal Medicine

## 2016-01-30 DIAGNOSIS — K219 Gastro-esophageal reflux disease without esophagitis: Secondary | ICD-10-CM | POA: Insufficient documentation

## 2016-01-30 DIAGNOSIS — E1100 Type 2 diabetes mellitus with hyperosmolarity without nonketotic hyperglycemic-hyperosmolar coma (NKHHC): Secondary | ICD-10-CM | POA: Diagnosis not present

## 2016-01-30 DIAGNOSIS — Z87891 Personal history of nicotine dependence: Secondary | ICD-10-CM | POA: Diagnosis not present

## 2016-01-30 DIAGNOSIS — H548 Legal blindness, as defined in USA: Secondary | ICD-10-CM | POA: Insufficient documentation

## 2016-01-30 DIAGNOSIS — E785 Hyperlipidemia, unspecified: Secondary | ICD-10-CM | POA: Diagnosis present

## 2016-01-30 DIAGNOSIS — J45909 Unspecified asthma, uncomplicated: Secondary | ICD-10-CM | POA: Insufficient documentation

## 2016-01-30 DIAGNOSIS — K3184 Gastroparesis: Secondary | ICD-10-CM | POA: Diagnosis not present

## 2016-01-30 DIAGNOSIS — N182 Chronic kidney disease, stage 2 (mild): Secondary | ICD-10-CM | POA: Insufficient documentation

## 2016-01-30 DIAGNOSIS — H540 Blindness, both eyes: Secondary | ICD-10-CM | POA: Diagnosis not present

## 2016-01-30 DIAGNOSIS — E1169 Type 2 diabetes mellitus with other specified complication: Secondary | ICD-10-CM | POA: Insufficient documentation

## 2016-01-30 DIAGNOSIS — E784 Other hyperlipidemia: Secondary | ICD-10-CM | POA: Insufficient documentation

## 2016-01-30 DIAGNOSIS — I5032 Chronic diastolic (congestive) heart failure: Secondary | ICD-10-CM | POA: Insufficient documentation

## 2016-01-30 DIAGNOSIS — R55 Syncope and collapse: Principal | ICD-10-CM | POA: Insufficient documentation

## 2016-01-30 DIAGNOSIS — I959 Hypotension, unspecified: Secondary | ICD-10-CM

## 2016-01-30 DIAGNOSIS — Z7982 Long term (current) use of aspirin: Secondary | ICD-10-CM | POA: Insufficient documentation

## 2016-01-30 DIAGNOSIS — Z970 Presence of artificial eye: Secondary | ICD-10-CM | POA: Diagnosis not present

## 2016-01-30 DIAGNOSIS — E1165 Type 2 diabetes mellitus with hyperglycemia: Secondary | ICD-10-CM | POA: Diagnosis not present

## 2016-01-30 DIAGNOSIS — I130 Hypertensive heart and chronic kidney disease with heart failure and stage 1 through stage 4 chronic kidney disease, or unspecified chronic kidney disease: Secondary | ICD-10-CM | POA: Diagnosis not present

## 2016-01-30 DIAGNOSIS — E1122 Type 2 diabetes mellitus with diabetic chronic kidney disease: Secondary | ICD-10-CM | POA: Diagnosis not present

## 2016-01-30 DIAGNOSIS — H547 Unspecified visual loss: Secondary | ICD-10-CM | POA: Diagnosis present

## 2016-01-30 DIAGNOSIS — Z79899 Other long term (current) drug therapy: Secondary | ICD-10-CM | POA: Diagnosis not present

## 2016-01-30 DIAGNOSIS — Z23 Encounter for immunization: Secondary | ICD-10-CM | POA: Insufficient documentation

## 2016-01-30 DIAGNOSIS — Z794 Long term (current) use of insulin: Secondary | ICD-10-CM | POA: Diagnosis not present

## 2016-01-30 DIAGNOSIS — E1149 Type 2 diabetes mellitus with other diabetic neurological complication: Secondary | ICD-10-CM | POA: Diagnosis present

## 2016-01-30 DIAGNOSIS — E118 Type 2 diabetes mellitus with unspecified complications: Secondary | ICD-10-CM | POA: Diagnosis present

## 2016-01-30 DIAGNOSIS — I951 Orthostatic hypotension: Secondary | ICD-10-CM

## 2016-01-30 DIAGNOSIS — E1143 Type 2 diabetes mellitus with diabetic autonomic (poly)neuropathy: Secondary | ICD-10-CM | POA: Diagnosis not present

## 2016-01-30 DIAGNOSIS — E039 Hypothyroidism, unspecified: Secondary | ICD-10-CM | POA: Diagnosis not present

## 2016-01-30 LAB — CBC WITH DIFFERENTIAL/PLATELET
Basophils Absolute: 0 10*3/uL (ref 0.0–0.1)
Basophils Relative: 0 %
EOS ABS: 0.1 10*3/uL (ref 0.0–0.7)
Eosinophils Relative: 1 %
HCT: 37.7 % (ref 36.0–46.0)
HEMOGLOBIN: 12.4 g/dL (ref 12.0–15.0)
LYMPHS ABS: 2 10*3/uL (ref 0.7–4.0)
Lymphocytes Relative: 23 %
MCH: 25.1 pg — AB (ref 26.0–34.0)
MCHC: 32.9 g/dL (ref 30.0–36.0)
MCV: 76.2 fL — ABNORMAL LOW (ref 78.0–100.0)
Monocytes Absolute: 0.8 10*3/uL (ref 0.1–1.0)
Monocytes Relative: 9 %
NEUTROS ABS: 6 10*3/uL (ref 1.7–7.7)
NEUTROS PCT: 67 %
PLATELETS: 196 10*3/uL (ref 150–400)
RBC: 4.95 MIL/uL (ref 3.87–5.11)
RDW: 16 % — ABNORMAL HIGH (ref 11.5–15.5)
WBC: 8.9 10*3/uL (ref 4.0–10.5)

## 2016-01-30 LAB — URINALYSIS, ROUTINE W REFLEX MICROSCOPIC
BILIRUBIN URINE: NEGATIVE
Glucose, UA: NEGATIVE mg/dL
Hgb urine dipstick: NEGATIVE
Ketones, ur: NEGATIVE mg/dL
LEUKOCYTES UA: NEGATIVE
NITRITE: NEGATIVE
PH: 5.5 (ref 5.0–8.0)
Protein, ur: NEGATIVE mg/dL
SPECIFIC GRAVITY, URINE: 1.011 (ref 1.005–1.030)

## 2016-01-30 LAB — BASIC METABOLIC PANEL
ANION GAP: 7 (ref 5–15)
BUN: 27 mg/dL — ABNORMAL HIGH (ref 6–20)
CHLORIDE: 105 mmol/L (ref 101–111)
CO2: 30 mmol/L (ref 22–32)
CREATININE: 1.19 mg/dL — AB (ref 0.44–1.00)
Calcium: 9.3 mg/dL (ref 8.9–10.3)
GFR calc non Af Amer: 50 mL/min — ABNORMAL LOW (ref >60)
GFR, EST AFRICAN AMERICAN: 58 mL/min — AB (ref >60)
Glucose, Bld: 126 mg/dL — ABNORMAL HIGH (ref 65–99)
POTASSIUM: 3.6 mmol/L (ref 3.5–5.1)
SODIUM: 142 mmol/L (ref 135–145)

## 2016-01-30 LAB — I-STAT TROPONIN, ED: Troponin i, poc: 0 ng/mL (ref 0.00–0.08)

## 2016-01-30 LAB — GLUCOSE, CAPILLARY
GLUCOSE-CAPILLARY: 228 mg/dL — AB (ref 65–99)
Glucose-Capillary: 277 mg/dL — ABNORMAL HIGH (ref 65–99)

## 2016-01-30 LAB — CBG MONITORING, ED: Glucose-Capillary: 174 mg/dL — ABNORMAL HIGH (ref 65–99)

## 2016-01-30 LAB — I-STAT CG4 LACTIC ACID, ED: LACTIC ACID, VENOUS: 1.15 mmol/L (ref 0.5–1.9)

## 2016-01-30 LAB — BRAIN NATRIURETIC PEPTIDE: B NATRIURETIC PEPTIDE 5: 42.1 pg/mL (ref 0.0–100.0)

## 2016-01-30 MED ORDER — SODIUM CHLORIDE 0.9 % IV BOLUS (SEPSIS)
500.0000 mL | Freq: Once | INTRAVENOUS | Status: AC
  Administered 2016-01-30: 500 mL via INTRAVENOUS

## 2016-01-30 MED ORDER — LINACLOTIDE 145 MCG PO CAPS
145.0000 ug | ORAL_CAPSULE | Freq: Every day | ORAL | Status: DC | PRN
  Filled 2016-01-30: qty 1

## 2016-01-30 MED ORDER — HYDRALAZINE HCL 20 MG/ML IJ SOLN
5.0000 mg | INTRAMUSCULAR | Status: DC | PRN
Start: 2016-01-30 — End: 2016-01-31

## 2016-01-30 MED ORDER — INFLUENZA VAC SPLIT QUAD 0.5 ML IM SUSY
0.5000 mL | PREFILLED_SYRINGE | INTRAMUSCULAR | Status: AC
  Administered 2016-01-31: 0.5 mL via INTRAMUSCULAR
  Filled 2016-01-30 (×2): qty 0.5

## 2016-01-30 MED ORDER — ACETAMINOPHEN 325 MG PO TABS
650.0000 mg | ORAL_TABLET | Freq: Once | ORAL | Status: AC
  Administered 2016-01-30: 650 mg via ORAL
  Filled 2016-01-30: qty 2

## 2016-01-30 MED ORDER — INFLUENZA VAC SPLIT QUAD 0.5 ML IM SUSY: 0.5000 mL | PREFILLED_SYRINGE | INTRAMUSCULAR | Status: DC

## 2016-01-30 MED ORDER — FLUOROMETHOLONE 0.25 % OP SUSP
1.0000 [drp] | Freq: Two times a day (BID) | OPHTHALMIC | Status: DC
  Filled 2016-01-30: qty 5

## 2016-01-30 MED ORDER — ACETAMINOPHEN 325 MG PO TABS: 650.0000 mg | ORAL_TABLET | Freq: Four times a day (QID) | ORAL | Status: DC | PRN

## 2016-01-30 MED ORDER — INSULIN DETEMIR 100 UNIT/ML ~~LOC~~ SOLN
30.0000 [IU] | Freq: Every day | SUBCUTANEOUS | Status: DC
  Administered 2016-01-30 – 2016-01-31 (×2): 30 [IU] via SUBCUTANEOUS
  Filled 2016-01-30 (×3): qty 0.3

## 2016-01-30 MED ORDER — CLOBETASOL PROPIONATE 0.05 % EX OINT
1.0000 "application " | TOPICAL_OINTMENT | Freq: Two times a day (BID) | CUTANEOUS | Status: DC | PRN
  Filled 2016-01-30: qty 15

## 2016-01-30 MED ORDER — SODIUM CHLORIDE 0.9 % IV SOLN: INTRAVENOUS | Status: DC

## 2016-01-30 MED ORDER — ACETAMINOPHEN 650 MG RE SUPP: 650.0000 mg | Freq: Four times a day (QID) | RECTAL | Status: DC | PRN

## 2016-01-30 MED ORDER — ALBUTEROL SULFATE (2.5 MG/3ML) 0.083% IN NEBU: 3.0000 mL | INHALATION_SOLUTION | RESPIRATORY_TRACT | Status: DC | PRN

## 2016-01-30 MED ORDER — TRAMADOL HCL 50 MG PO TABS
50.0000 mg | ORAL_TABLET | Freq: Every evening | ORAL | Status: DC | PRN
  Administered 2016-01-30 – 2016-01-31 (×2): 50 mg via ORAL
  Filled 2016-01-30 (×2): qty 1

## 2016-01-30 MED ORDER — MIDODRINE HCL 5 MG PO TABS
5.0000 mg | ORAL_TABLET | Freq: Three times a day (TID) | ORAL | Status: DC
  Administered 2016-01-30 – 2016-02-01 (×5): 5 mg via ORAL
  Filled 2016-01-30 (×6): qty 1

## 2016-01-30 MED ORDER — SIMVASTATIN 20 MG PO TABS
20.0000 mg | ORAL_TABLET | Freq: Every day | ORAL | Status: DC
  Administered 2016-01-30 – 2016-01-31 (×2): 20 mg via ORAL
  Filled 2016-01-30 (×2): qty 1

## 2016-01-30 MED ORDER — PANTOPRAZOLE SODIUM 40 MG PO TBEC
40.0000 mg | DELAYED_RELEASE_TABLET | Freq: Every day | ORAL | Status: DC
  Administered 2016-01-31 – 2016-02-01 (×2): 40 mg via ORAL
  Filled 2016-01-30 (×2): qty 1

## 2016-01-30 MED ORDER — ONDANSETRON HCL 4 MG PO TABS: 4.0000 mg | ORAL_TABLET | Freq: Four times a day (QID) | ORAL | Status: DC | PRN

## 2016-01-30 MED ORDER — NEOMYCIN-POLYMYXIN-DEXAMETH 3.5-10000-0.1 OP OINT
TOPICAL_OINTMENT | Freq: Every day | OPHTHALMIC | Status: DC
  Administered 2016-01-30 – 2016-02-01 (×3): via OPHTHALMIC
  Filled 2016-01-30: qty 3.5

## 2016-01-30 MED ORDER — LEVOTHYROXINE SODIUM 150 MCG PO TABS
150.0000 ug | ORAL_TABLET | Freq: Every day | ORAL | Status: DC
  Administered 2016-01-31 – 2016-02-01 (×2): 150 ug via ORAL
  Filled 2016-01-30 (×2): qty 1
  Filled 2016-01-30: qty 2

## 2016-01-30 MED ORDER — OXYCODONE-ACETAMINOPHEN 5-325 MG PO TABS
1.0000 | ORAL_TABLET | Freq: Four times a day (QID) | ORAL | Status: DC | PRN
  Administered 2016-01-30: 1 via ORAL
  Filled 2016-01-30: qty 1

## 2016-01-30 MED ORDER — SODIUM CHLORIDE 0.9 % IV SOLN
INTRAVENOUS | Status: AC
  Administered 2016-01-30: 18:00:00 via INTRAVENOUS

## 2016-01-30 MED ORDER — SODIUM CHLORIDE 0.9% FLUSH
3.0000 mL | Freq: Two times a day (BID) | INTRAVENOUS | Status: DC
  Administered 2016-01-30 – 2016-01-31 (×2): 3 mL via INTRAVENOUS

## 2016-01-30 MED ORDER — INSULIN ASPART 100 UNIT/ML ~~LOC~~ SOLN
0.0000 [IU] | Freq: Three times a day (TID) | SUBCUTANEOUS | Status: DC
  Administered 2016-01-30: 3 [IU] via SUBCUTANEOUS
  Administered 2016-01-31: 5 [IU] via SUBCUTANEOUS
  Administered 2016-01-31: 3 [IU] via SUBCUTANEOUS
  Administered 2016-01-31: 2 [IU] via SUBCUTANEOUS
  Administered 2016-02-01: 3 [IU] via SUBCUTANEOUS
  Administered 2016-02-01: 5 [IU] via SUBCUTANEOUS

## 2016-01-30 MED ORDER — MONTELUKAST SODIUM 10 MG PO TABS
10.0000 mg | ORAL_TABLET | Freq: Every day | ORAL | Status: DC
  Administered 2016-01-30 – 2016-01-31 (×2): 10 mg via ORAL
  Filled 2016-01-30 (×2): qty 1

## 2016-01-30 MED ORDER — PREGABALIN 50 MG PO CAPS
150.0000 mg | ORAL_CAPSULE | Freq: Three times a day (TID) | ORAL | Status: DC
  Administered 2016-01-30 – 2016-02-01 (×5): 150 mg via ORAL
  Filled 2016-01-30 (×5): qty 3

## 2016-01-30 MED ORDER — ASPIRIN EC 81 MG PO TBEC
81.0000 mg | DELAYED_RELEASE_TABLET | Freq: Every day | ORAL | Status: DC
  Administered 2016-01-31 – 2016-02-01 (×2): 81 mg via ORAL
  Filled 2016-01-30 (×2): qty 1

## 2016-01-30 MED ORDER — ONDANSETRON HCL 4 MG/2ML IJ SOLN
4.0000 mg | Freq: Four times a day (QID) | INTRAMUSCULAR | Status: DC | PRN
  Administered 2016-01-31: 4 mg via INTRAVENOUS
  Filled 2016-01-30: qty 2

## 2016-01-30 MED ORDER — ENOXAPARIN SODIUM 40 MG/0.4ML ~~LOC~~ SOLN
40.0000 mg | SUBCUTANEOUS | Status: DC
  Administered 2016-01-30 – 2016-01-31 (×2): 40 mg via SUBCUTANEOUS
  Filled 2016-01-30 (×2): qty 0.4

## 2016-01-30 MED ORDER — PNEUMOCOCCAL VAC POLYVALENT 25 MCG/0.5ML IJ INJ
0.5000 mL | INJECTION | INTRAMUSCULAR | Status: AC
  Administered 2016-01-31: 0.5 mL via INTRAMUSCULAR
  Filled 2016-01-30 (×2): qty 0.5

## 2016-01-30 NOTE — ED Notes (Signed)
Phlebotomist draws her blood.

## 2016-01-30 NOTE — ED Notes (Signed)
Bed: SX28 Expected date: 01/30/16 Expected time: 10:35 AM Means of arrival: Ambulance Comments: Low bp 119/70 was 65/ headache

## 2016-01-30 NOTE — H&P (Signed)
History and Physical    Amy [name] URK:[id number] DOB: [birth year] DOA: 01/30/2016  Referring MD/NP/PA:   PCP: Amy Hatchet, MD   Patient coming from:  The patient is coming from home.  At baseline, pt is largely dependent for her ADL.   Chief Complaint: Near-syncope, dizziness and hypotension  HPI: Amy [name] is a 55 y.o. female with medical history significant of bilateral blindness, hypertension, hyperlipidemia, diabetes mellitus, asthma, GERD, hypothyroidism, chronic lower back pain, diastolic congestive heart failure, CKD-II, who presents with near syncope, dizziness and hypotension.   Per her sister, pt was normal in AM, but her caregiver found pt suddenly "turned grey" and her SBP dropped to 60s. Pt states she felt  Weak, dizzy and  Lightheaded.  EMS states pt's SBP was "100" on their arrival to scene and pt feels better. She denies chest pain, shortness breath, palpitation, unilateral weakness or numbness in extremities. No fever, chills, nausea, vomiting, diarrhea, abdominal pain, symptoms of UTI.   ED Course: pt was found to have blood pressure 110/69 in ED, but per EDP, she is positive for orthostatic hypotension in ED. Negative urinalysis, lactate 1.15, WBC 8.9, temperature normal, no tachycardia, no tachypnea, stable renal function.  Review of Systems:   General: no fevers, chills, no changes in body weight, has fatigue HEENT: no blurry vision, hearing changes or sore throat Respiratory: no dyspnea, coughing, wheezing CV: no chest pain, no palpitations GI: no nausea, vomiting, abdominal pain, diarrhea, constipation GU: no dysuria, burning on urination, increased urinary frequency, hematuria  Ext: no leg edema Neuro: no unilateral weakness, numbness, or tingling, no vision change or hearing loss. Has dizziness and lightheadedness. Skin: no rash, no skin tear. MSK: No muscle spasm, no deformity, no limitation of range of movement in spin Heme: No easy bruising.    Travel history: No recent long distant travel.  Allergy:  Allergies  Allergen Reactions  . Penicillins Hives and Swelling    Has patient had a PCN reaction causing immediate rash, facial/tongue/throat swelling, SOB or lightheadedness with hypotension: yes- face swelling Has patient had a PCN reaction causing severe rash involving mucus membranes or skin necrosis: no Has patient had a PCN reaction that required hospitalization unknown (childhood allergy) Has patient had a PCN reaction occurring within the last 10 years: no If all of the above answers are "NO", then may proceed with Cephalosporin use.   . Codeine Nausea Only  . Iodine-131 Hives    hives  . Iohexol Hives     Code: HIVES, Desc: pt developed itching and hives along with nasal congestion; needs 13 hour premeds for future studies, Onset Date: [id number]   . Lisinopril Cough  . Sulfa Antibiotics Nausea And Vomiting    Past Medical History:  Diagnosis Date  . Asthma   . Blind   . Blindness and low vision    left eye glass eye,  legally blind in right eye  . Diabetes mellitus   . Diabetic neuropathy ([place])   . Hyperlipidemia   . Hypertension   . Hypothyroidism   . Spinal stenosis     Past Surgical History:  Procedure Laterality Date  . ABDOMINAL HYSTERECTOMY  1990  . [place]   x 2  . CHOLECYSTECTOMY  2008  . ENUCLEATION Bilateral 09/15/1998  . EYE SURGERY  2016   fitting artificial eye    Social History:  reports that she quit smoking about 23 years ago. Her smoking use included Cigarettes. She has never  used smokeless tobacco. She reports that she does not drink alcohol or use drugs.  Family History:  Family History  Problem Relation Age of Onset  . Diabetes Sister   . Glaucoma Sister   . Hypertension Sister   . Stroke Brother   . Heart attack Paternal Aunt      Prior to Admission medications   Medication Sig Start Date End Date Taking? Authorizing Provider  albuterol (PROVENTIL  HFA;VENTOLIN HFA) 108 (90 BASE) MCG/ACT inhaler Inhale 2 puffs into the lungs every 4 (four) hours as needed for wheezing or shortness of breath. For short    Historical Provider, MD  aspirin EC 81 MG tablet Take 81 mg by mouth daily.    Historical Provider, MD  clobetasol ointment (TEMOVATE) 4.78 % Apply 1 application topically 2 (two) times daily as needed (dermatosis).  06/05/15   Historical Provider, MD  Dulaglutide (TRULICITY [place]) Inject 1.7 mLs into the skin once a week.     Historical Provider, MD  esomeprazole (NEXIUM) 40 MG capsule Take 40 mg by mouth 2 (two) times daily before a meal.     Historical Provider, MD  fluorometholone (FML FORTE) 0.25 % ophthalmic suspension Place 1 drop into the right eye 2 (two) times daily.    Historical Provider, MD  furosemide (LASIX) 20 MG tablet Take 20 mg by mouth daily.  09/03/15   Historical Provider, MD  HUMALOG KWIKPEN 100 UNIT/ML KiwkPen Inject 8-14 Units into the skin 3 (three) times daily. 8 units with breakfast 10 units with lunch and 14 units with dinner 12/26/13   Historical Provider, MD  irbesartan (AVAPRO) 75 MG tablet Take 75 mg by mouth daily. 09/03/15   Historical Provider, MD  LEVEMIR FLEXTOUCH 100 UNIT/ML Pen Inject 45 Units into the skin every evening. 09/21/15   Eugenie Filler, MD  levothyroxine (SYNTHROID, LEVOTHROID) 150 MCG tablet Take 150 mcg by mouth daily. 10/28/14   Historical Provider, MD  Linaclotide Rolan Lipa) 145 MCG CAPS capsule Take 145 mcg by mouth daily as needed (constipation).     Historical Provider, MD  LYRICA 150 MG capsule Take 150 mg by mouth 3 (three) times daily. 10/28/14   Historical Provider, MD  midodrine (PROAMATINE) 10 MG tablet Take 10 mg by mouth 3 (three) times daily.  05/12/15   Historical Provider, MD  montelukast (SINGULAIR) 10 MG tablet Take 10 mg by mouth at bedtime.    Historical Provider, MD  neomycin-polymyxin b-dexamethasone (MAXITROL) 3.5-10000-0.1 OINT Use 1/4" ribbon to affected eye once a day as  needed for antibiotic. If you develop allergy symptoms, stop & call our office. 05/13/15   Historical Provider, MD  simvastatin (ZOCOR) 20 MG tablet Take 20 mg by mouth at bedtime.     Historical Provider, MD  traMADol (ULTRAM) 50 MG tablet Take 1 tablet (50 mg total) by mouth at bedtime as needed for severe pain. 12/19/15   Ozella Almond Ward, PA-C    Physical Exam: Vitals:   01/30/16 1051 01/30/16 1259 01/30/16 1621 01/30/16 1656  BP: 121/67 110/69 90/55 (!) 122/59  Pulse: 72 74 71 69  Resp: 16 16 18 18   Temp: 98.6 F (37 C)   97.7 F (36.5 C)  TempSrc: Oral   Oral  SpO2: 99% 98% 100% 100%  Weight:    89 kg (196 lb 3.4 oz)  Height:    4\' 11"  (1.499 m)   General: Not in acute distress. Dry mucus membrane HEENT:       Eyes: has blindness  bilaterally,, no scleral icterus.       ENT: No discharge from the ears and nose, no pharynx injection, no tonsillar enlargement.        Neck: No JVD, no bruit, no mass felt. Heme: No neck lymph node enlargement. Cardiac: S1/S2, RRR, No murmurs, No gallops or rubs. Respiratory: No rales, wheezing, rhonchi or rubs. GI: Soft, nondistended, nontender, no rebound pain, no organomegaly, BS present. GU: No hematuria Ext: No pitting leg edema bilaterally. 2+DP/PT pulse bilaterally. Musculoskeletal: No joint deformities, No joint redness or warmth, no limitation of ROM in spin. Skin: No rashes.  Neuro: Alert, oriented X3, cranial nerves II-XII grossly intact except for blindness, moves all extremities normally. Muscle strength 5/5 in all extremities, sensation to light touch intact.  Knee reflex 1+ bilaterally. Negative Babinski's sign.  Psych: Patient is not psychotic, no suicidal or hemocidal ideation.  Labs on Admission: I have personally reviewed following labs and imaging studies  CBC:  Recent Labs Lab 01/30/16 1153  WBC 8.9  NEUTROABS 6.0  HGB 12.4  HCT 37.7  MCV 76.2*  PLT 779   Basic Metabolic Panel:  Recent Labs Lab 01/30/16 1153    NA 142  K 3.6  CL 105  CO2 30  GLUCOSE 126*  BUN 27*  CREATININE 1.19*  CALCIUM 9.3   GFR: Estimated Creatinine Clearance: 51.3 mL/min (by C-G formula based on SCr of 1.19 mg/dL (H)). Liver Function Tests: No results for input(s): AST, ALT, ALKPHOS, BILITOT, PROT, ALBUMIN in the last 168 hours. No results for input(s): LIPASE, AMYLASE in the last 168 hours. No results for input(s): AMMONIA in the last 168 hours. Coagulation Profile: No results for input(s): INR, PROTIME in the last 168 hours. Cardiac Enzymes: No results for input(s): CKTOTAL, CKMB, CKMBINDEX, TROPONINI in the last 168 hours. BNP (last 3 results) No results for input(s): PROBNP in the last 8760 hours. HbA1C: No results for input(s): HGBA1C in the last 72 hours. CBG:  Recent Labs Lab 01/30/16 1121 01/30/16 1700  GLUCAP 174* 228*   Lipid Profile: No results for input(s): CHOL, HDL, LDLCALC, TRIG, CHOLHDL, LDLDIRECT in the last 72 hours. Thyroid Function Tests: No results for input(s): TSH, T4TOTAL, FREET4, T3FREE, THYROIDAB in the last 72 hours. Anemia Panel: No results for input(s): VITAMINB12, FOLATE, FERRITIN, TIBC, IRON, RETICCTPCT in the last 72 hours. Urine analysis:    Component Value Date/Time   COLORURINE YELLOW 01/30/2016 1220   APPEARANCEUR CLOUDY (A) 01/30/2016 1220   LABSPEC 1.011 01/30/2016 1220   PHURINE 5.5 01/30/2016 1220   GLUCOSEU NEGATIVE 01/30/2016 1220   HGBUR NEGATIVE 01/30/2016 1220   BILIRUBINUR NEGATIVE 01/30/2016 1220   KETONESUR NEGATIVE 01/30/2016 1220   PROTEINUR NEGATIVE 01/30/2016 1220   UROBILINOGEN 0.2 01/16/2015 1121   NITRITE NEGATIVE 01/30/2016 1220   LEUKOCYTESUR NEGATIVE 01/30/2016 1220   Sepsis Labs: @LABRCNTIP (procalcitonin:4,lacticidven:4) )No results found for this or any previous visit (from the past 240 hour(s)).   Radiological Exams on Admission: Dg Chest 2 View  Result Date: 01/30/2016 CLINICAL DATA:  Hypotension.  History of asthma and  diabetes. EXAM: CHEST  2 VIEW COMPARISON:  09/15/2015 FINDINGS: Thoracic spondylosis. Heart size within normal limits for projection. Mild upper zone pulmonary vascular prominence similar to prior, but this is nonspecific due due semi erect positioning. No airspace opacity identified.  No pleural effusion. IMPRESSION: 1. Upper zone pulmonary vascular prominence is probably due to semi erect positioning rather than pulmonary venous hypertension. There is no cardiomegaly or edema. Electronically Signed   By:  Van Clines M.D.   On: 01/30/2016 12:07     EKG: Independently reviewed. Sinus rhythm, QTC 458, no ischemic change.  Assessment/Plan Principal Problem:   Near syncope Active Problems:   Blindness   Asthma   Hypothyroidism   DM (diabetes mellitus), type 2, uncontrolled, with hyperosmolarity (HCC)   Diabetes mellitus with neurological manifestations ([place])   Hyperlipidemia LDL goal <70   Hypotension   Near syncope: Most likely due to orthostatic status. Patient seems to be dry clinically on admission. She is taking Lasix 20 mg daily for dCHF, which may have contributed to her orthostatic status. No focal neurologic findings on physical examination. Less likely to have stroke  -Telemetry bed -Hold herLasix -Gentle IV fluid: 500 mL NS, followed by 100 mLperhour -Frequent neuro check - continue home  Midodrine 5 mg 3 times a day -PT/OT  HTN: had one episode of hypotension which is likely due to orthostatic status. -Hold irbesartan and Lasix -IV hydralazine when necessary  Asthma: stable -continue Singulair -When necessary albuterol inhaler  Hypothyroidism: Last TSH was 5.477 on 10/22/12 -Continue home Synthroid -Check TSH  DM-II: Last A1c 10.1 on 11/01/14, poorly controled. Patient is taking Levemir, Humalog, Dulaglutide at home. Blood sugar 126. -will decrease Lantus dose from  45 to 30 U daily -SSI  HLD: Last LDL was 92 on 10/22/12 -Continue home medications:  Zocor  GERD: -Protonix  Chronic diastolic congestive heart failure: 2-D echo on 07/06/15 showed EF 60-65% with grade 2 diastolic dysfunction. CHF is compensated. No leg edema or JVD. - Hold Lasix - Check BNP   DVT ppx: SQ Lovenox Code Status: Full code Family Communication: es, patient's sister at bed side Disposition Plan:  Anticipate discharge back to previous home environment Consults called:  none Admission status: Obs / tele    Date of Service 01/30/2016    Ivor Costa [facility] Pager [phone]  If 7PM-7AM, please contact night-coverage [email or web address] Password [facility] 01/30/2016, 5:43 PM

## 2016-01-30 NOTE — ED Provider Notes (Signed)
[facility] DEPT Provider Note   CSN: [id number] Arrival date & time: 01/30/16  1040     History   Chief Complaint Chief Complaint  Patient presents with  . Hypotension    HPI Amy [name] is a 55 y.o. female.  HPI Pt was seen at 1100. Per pt and her caregiver, c/o sudden onset and resolution of one episode of near syncope that occurred this morning PTA. Pt's caregiver states pt's BP was "normal when I got there this morning." She then states pt "suddenly got this look" and "turned grey." Pt states she felt "very weak" and lightheaded. Pt's caregiver checked pt's BP; SBP was "60's." EMS states pt's SBP was "100" on their arrival to scene. Pt states she "feels better now." Denies syncope, no CP/palpitations, no SOB/cough, no abd pain, no N/V/D, no fevers, no focal motor weakness, no tingling/numbness in extremities.    Past Medical History:  Diagnosis Date  . Asthma   . Blind   . Blindness and low vision    left eye glass eye,  legally blind in right eye  . Diabetes mellitus   . Diabetic neuropathy ([place])   . Hyperlipidemia   . Hypertension   . Hypothyroidism   . Spinal stenosis     Patient Active Problem List   Diagnosis Date Noted  . SOB (shortness of breath)   . CAP (community acquired pneumonia) 09/12/2015  . Hypoxia 09/12/2015  . Hypertension 07/05/2015  . Hypotension 07/05/2015  . Diabetes mellitus with neurological manifestations ([place]) 11/04/2014  . Hyperlipidemia LDL goal <70 11/04/2014  . Spinal stenosis, multilevel 11/04/2014  . Hyperkalemia 11/01/2014  . Hypoglycemia 08/09/2014  . DM (diabetes mellitus), type 2, uncontrolled, with hyperosmolarity ([place]) 08/08/2014  . Elevated troponin 08/08/2014  . Nausea vomiting and diarrhea 08/08/2014  . Dizziness 08/06/2012  . Orthostatic hypotension 08/06/2012  . Hypernatremia 08/06/2012  . Acute gastroenteritis 08/13/2011  . Gastroparesis 08/13/2011  . Chest pain 08/13/2011  . DM (diabetes mellitus) ([place])  08/13/2011  . Oral thrush 08/13/2011  . Gastroenteritis 05/23/2011  . Hypotension 05/23/2011  . Dehydration 05/23/2011  . Blindness 05/23/2011  . DM type 1, not at goal, causing eye disease ([place]) 05/23/2011  . Asthma 05/23/2011  . Diarrhea 05/23/2011  . Vomiting 05/23/2011  . Hypothyroidism 05/23/2011  . Diabetic neuropathy ([place]) 05/23/2011  . Diabetic nephropathy ([place]) 05/23/2011    Past Surgical History:  Procedure Laterality Date  . ABDOMINAL HYSTERECTOMY  1990  . [place]   x 2  . CHOLECYSTECTOMY  2008  . ENUCLEATION Bilateral 09/15/1998  . EYE SURGERY  2016   fitting artificial eye    OB History    Obstetric Comments   Pt has two sons.       Home Medications    Prior to Admission medications   Medication Sig Start Date End Date Taking? Authorizing Provider  albuterol (PROVENTIL HFA;VENTOLIN HFA) 108 (90 BASE) MCG/ACT inhaler Inhale 2 puffs into the lungs every 4 (four) hours as needed for wheezing or shortness of breath. For short    Historical Provider, MD  aspirin EC 81 MG tablet Take 81 mg by mouth daily.    Historical Provider, MD  clobetasol ointment (TEMOVATE) 9.37 % Apply 1 application topically 2 (two) times daily as needed (dermatosis).  06/05/15   Historical Provider, MD  Dulaglutide (TRULICITY Dante) Inject 1.7 mLs into the skin once a week.     Historical Provider, MD  esomeprazole (NEXIUM) 40 MG capsule Take 40  mg by mouth 2 (two) times daily before a meal.     Historical Provider, MD  fluorometholone (FML FORTE) 0.25 % ophthalmic suspension Place 1 drop into the right eye 2 (two) times daily.    Historical Provider, MD  furosemide (LASIX) 20 MG tablet Take 20 mg by mouth daily.  09/03/15   Historical Provider, MD  HUMALOG KWIKPEN 100 UNIT/ML KiwkPen Inject 8-14 Units into the skin 3 (three) times daily. 8 units with breakfast 10 units with lunch and 14 units with dinner 12/26/13   Historical Provider, MD  irbesartan (AVAPRO) 75 MG tablet  Take 75 mg by mouth daily. 09/03/15   Historical Provider, MD  LEVEMIR FLEXTOUCH 100 UNIT/ML Pen Inject 45 Units into the skin every evening. 09/21/15   Eugenie Filler, MD  levothyroxine (SYNTHROID, LEVOTHROID) 150 MCG tablet Take 150 mcg by mouth daily. 10/28/14   Historical Provider, MD  Linaclotide Rolan Lipa) 145 MCG CAPS capsule Take 145 mcg by mouth daily as needed (constipation).     Historical Provider, MD  LYRICA 150 MG capsule Take 150 mg by mouth 3 (three) times daily. 10/28/14   Historical Provider, MD  midodrine (PROAMATINE) 10 MG tablet Take 10 mg by mouth 3 (three) times daily.  05/12/15   Historical Provider, MD  montelukast (SINGULAIR) 10 MG tablet Take 10 mg by mouth at bedtime.    Historical Provider, MD  neomycin-polymyxin b-dexamethasone (MAXITROL) 3.5-10000-0.1 OINT Use 1/4" ribbon to affected eye once a day as needed for antibiotic. If you develop allergy symptoms, stop & call our office. 05/13/15   Historical Provider, MD  simvastatin (ZOCOR) 20 MG tablet Take 20 mg by mouth at bedtime.     Historical Provider, MD  traMADol (ULTRAM) 50 MG tablet Take 1 tablet (50 mg total) by mouth at bedtime as needed for severe pain. 12/19/15   Stella, PA-C    Family History Family History  Problem Relation Age of Onset  . Diabetes Sister   . Glaucoma Sister   . Hypertension Sister   . Stroke Brother   . Heart attack Paternal Aunt     Social History Social History  Substance Use Topics  . Smoking status: Former Smoker    Types: Cigarettes    Quit date: 05/22/1992  . Smokeless tobacco: Never Used  . Alcohol use No     Comment: quit 20 yrs ago     Allergies   Codeine; Iodine-131; Iohexol; Penicillins; and Sulfa antibiotics   Review of Systems Review of Systems ROS: Statement: All systems negative except as marked or noted in the HPI; Constitutional: Negative for fever and chills. ; ; Eyes: Negative for eye pain, redness and discharge. ; ; ENMT: Negative for ear  pain, hoarseness, nasal congestion, sinus pressure and sore throat. ; ; Cardiovascular: Negative for chest pain, palpitations, diaphoresis, dyspnea and peripheral edema. ; ; Respiratory: Negative for cough, wheezing and stridor. ; ; Gastrointestinal: Negative for nausea, vomiting, diarrhea, abdominal pain, blood in stool, hematemesis, jaundice and rectal bleeding. . ; ; Genitourinary: Negative for dysuria, flank pain and hematuria. ; ; Musculoskeletal: Negative for back pain and neck pain. Negative for swelling and trauma.; ; Skin: Negative for pruritus, rash, abrasions, blisters, bruising and skin lesion.; ; Neuro: +near syncope. Negative for headache and neck stiffness. Negative for altered level of consciousness, altered mental status, extremity weakness, paresthesias, involuntary movement, seizure and syncope.       Physical Exam Updated Vital Signs BP 110/69 (BP Location: Left Arm)  Pulse 74   Temp 98.6 F (37 C) (Oral)   Resp 16   SpO2 98%    12:54 Orthostatic Vital Signs KB  Orthostatic Lying   BP- Lying: 110/69  Pulse- Lying: 66      Orthostatic Sitting  BP- Sitting: 96/71  Pulse- Sitting: 77      Orthostatic Standing at 0 minutes  BP- Standing at 0 minutes:  69/54  Pulse- Standing at 0 minutes: 79     Physical Exam 1105: Physical examination:  Nursing notes reviewed; Vital signs and O2 SAT reviewed;  Constitutional: Well developed, Well nourished, In no acute distress; Head:  Normocephalic, atraumatic; Eyes: EOMI, PERRL, No scleral icterus; ENMT: Mouth and pharynx normal, Mucous membranes dry; Neck: Supple, Full range of motion, No lymphadenopathy; Cardiovascular: Regular rate and rhythm, No gallop; Respiratory: Breath sounds clear & equal bilaterally, No wheezes.  Speaking full sentences with ease, Normal respiratory effort/excursion; Chest: Nontender, Movement normal; Abdomen: Soft, Nontender, Nondistended, Normal bowel sounds; Genitourinary: No CVA tenderness;  Extremities: Pulses normal, No tenderness, No edema, No calf edema or asymmetry.; Neuro: AA&Ox3, +blind per hx, otherwise major CN grossly intact.  Speech clear. No gross focal motor or sensory deficits in extremities.; Skin: Color normal, Warm, Dry.   ED Treatments / Results  Labs (all labs ordered are listed, but only abnormal results are displayed)   EKG  EKG Interpretation None       Radiology   Procedures Procedures (including critical care time)  Medications Ordered in ED Medications  sodium chloride 0.9 % bolus 500 mL (not administered)  0.9 %  sodium chloride infusion (not administered)  acetaminophen (TYLENOL) tablet 650 mg (650 mg Oral Given 01/30/16 1332)     Initial Impression / Assessment and Plan / ED Course  I have reviewed the triage vital signs and the nursing notes.  Pertinent labs & imaging results that were available during my care of the patient were reviewed by me and considered in my medical decision making (see chart for details).  MDM Reviewed: previous chart, nursing note and vitals Reviewed previous: labs Interpretation: labs and x-ray Total time providing critical care: 30-74 minutes. This excludes time spent performing separately reportable procedures and services. Consults: admitting MD   CRITICAL CARE Performed by: Alfonzo Feller Total critical care time: 35 minutes Critical care time was exclusive of separately billable procedures and treating other patients. Critical care was necessary to treat or prevent imminent or life-threatening deterioration. Critical care was time spent personally by me on the following activities: development of treatment plan with patient and/or surrogate as well as nursing, discussions with consultants, evaluation of patient's response to treatment, examination of patient, obtaining history from patient or surrogate, ordering and performing treatments and interventions, ordering and review of laboratory  studies, ordering and review of radiographic studies, pulse oximetry and re-evaluation of patient's condition.   Results for orders placed or performed during the hospital encounter of 01/30/16  Urinalysis, Routine w reflex microscopic  Result Value Ref Range   Color, Urine YELLOW YELLOW   APPearance CLOUDY (A) CLEAR   Specific Gravity, Urine 1.011 1.005 - 1.030   pH 5.5 5.0 - 8.0   Glucose, UA NEGATIVE NEGATIVE mg/dL   Hgb urine dipstick NEGATIVE NEGATIVE   Bilirubin Urine NEGATIVE NEGATIVE   Ketones, ur NEGATIVE NEGATIVE mg/dL   Protein, ur NEGATIVE NEGATIVE mg/dL   Nitrite NEGATIVE NEGATIVE   Leukocytes, UA NEGATIVE NEGATIVE  Basic metabolic panel  Result Value Ref Range   Sodium 142  135 - 145 mmol/L   Potassium 3.6 3.5 - 5.1 mmol/L   Chloride 105 101 - 111 mmol/L   CO2 30 22 - 32 mmol/L   Glucose, Bld 126 (H) 65 - 99 mg/dL   BUN 27 (H) 6 - 20 mg/dL   Creatinine, Ser 1.19 (H) 0.44 - 1.00 mg/dL   Calcium 9.3 8.9 - 10.3 mg/dL   GFR calc non Af Amer 50 (L) >60 mL/min   GFR calc Af Amer 58 (L) >60 mL/min   Anion gap 7 5 - 15  CBC with Differential  Result Value Ref Range   WBC 8.9 4.0 - 10.5 K/uL   RBC 4.95 3.87 - 5.11 MIL/uL   Hemoglobin 12.4 12.0 - 15.0 g/dL   HCT 37.7 36.0 - 46.0 %   MCV 76.2 (L) 78.0 - 100.0 fL   MCH 25.1 (L) 26.0 - 34.0 pg   MCHC 32.9 30.0 - 36.0 g/dL   RDW 16.0 (H) 11.5 - 15.5 %   Platelets 196 150 - 400 K/uL   Neutrophils Relative % 67 %   Neutro Abs 6.0 1.7 - 7.7 K/uL   Lymphocytes Relative 23 %   Lymphs Abs 2.0 0.7 - 4.0 K/uL   Monocytes Relative 9 %   Monocytes Absolute 0.8 0.1 - 1.0 K/uL   Eosinophils Relative 1 %   Eosinophils Absolute 0.1 0.0 - 0.7 K/uL   Basophils Relative 0 %   Basophils Absolute 0.0 0.0 - 0.1 K/uL  POC CBG, ED  Result Value Ref Range   Glucose-Capillary 174 (H) 65 - 99 mg/dL  I-Stat CG4 Lactic Acid, ED  Result Value Ref Range   Lactic Acid, Venous 1.15 0.5 - 1.9 mmol/L   Dg Chest 2 View Result Date:  01/30/2016 CLINICAL DATA:  Hypotension.  History of asthma and diabetes. EXAM: CHEST  2 VIEW COMPARISON:  09/15/2015 FINDINGS: Thoracic spondylosis. Heart size within normal limits for projection. Mild upper zone pulmonary vascular prominence similar to prior, but this is nonspecific due due semi erect positioning. No airspace opacity identified.  No pleural effusion. IMPRESSION: 1. Upper zone pulmonary vascular prominence is probably due to semi erect positioning rather than pulmonary venous hypertension. There is no cardiomegaly or edema. Electronically Signed   By: Van Clines M.D.   On: 01/30/2016 12:07    1505:  Pt orthostatic on VS, judicious IVF given. Dx and testing d/w pt and family.  Questions answered.  Verb understanding, agreeable to observation admit. T/C to [facility]. Blaine Hamper, case discussed, including:  HPI, pertinent PM/SHx, VS/PE, dx testing, ED course and treatment:  Agreeable to admit, requests to write temporary orders, obtain observation tele bed to team [facility].  Final Clinical Impressions(s) / ED Diagnoses   Final diagnoses:  None    New Prescriptions New Prescriptions   No medications on file     Francine Graven, DO 02/03/16 1647

## 2016-01-30 NOTE — ED Triage Notes (Signed)
Her home-health aide found her b/p (with pt's. Own electronic machine) to be low with a "systoloc of 60". EMS obtained normal b/p--pt. Brought to E.d. "just to make sure everything is ok". Pt. Is blind and tells me she has felt well recently.

## 2016-01-30 NOTE — ED Notes (Signed)
16:10 PT CAN GO TO FLOOR.

## 2016-01-31 DIAGNOSIS — H540 Blindness, both eyes: Secondary | ICD-10-CM

## 2016-01-31 DIAGNOSIS — E038 Other specified hypothyroidism: Secondary | ICD-10-CM | POA: Diagnosis not present

## 2016-01-31 DIAGNOSIS — E785 Hyperlipidemia, unspecified: Secondary | ICD-10-CM

## 2016-01-31 DIAGNOSIS — R55 Syncope and collapse: Secondary | ICD-10-CM

## 2016-01-31 LAB — BASIC METABOLIC PANEL
Anion gap: 6 (ref 5–15)
BUN: 25 mg/dL — AB (ref 6–20)
CO2: 27 mmol/L (ref 22–32)
Calcium: 9 mg/dL (ref 8.9–10.3)
Chloride: 109 mmol/L (ref 101–111)
Creatinine, Ser: 1.03 mg/dL — ABNORMAL HIGH (ref 0.44–1.00)
GFR calc Af Amer: >60 mL/min (ref >60)
GFR, EST NON AFRICAN AMERICAN: 60 mL/min — AB (ref >60)
GLUCOSE: 205 mg/dL — AB (ref 65–99)
POTASSIUM: 4.1 mmol/L (ref 3.5–5.1)
Sodium: 142 mmol/L (ref 135–145)

## 2016-01-31 LAB — URINE CULTURE

## 2016-01-31 LAB — GLUCOSE, CAPILLARY
GLUCOSE-CAPILLARY: 213 mg/dL — AB (ref 65–99)
GLUCOSE-CAPILLARY: 257 mg/dL — AB (ref 65–99)
Glucose-Capillary: 162 mg/dL — ABNORMAL HIGH (ref 65–99)
Glucose-Capillary: 283 mg/dL — ABNORMAL HIGH (ref 65–99)

## 2016-01-31 LAB — CBC
HEMATOCRIT: 35.6 % — AB (ref 36.0–46.0)
Hemoglobin: 11.5 g/dL — ABNORMAL LOW (ref 12.0–15.0)
MCH: 24.8 pg — AB (ref 26.0–34.0)
MCHC: 32.3 g/dL (ref 30.0–36.0)
MCV: 76.9 fL — AB (ref 78.0–100.0)
Platelets: 163 10*3/uL (ref 150–400)
RBC: 4.63 MIL/uL (ref 3.87–5.11)
RDW: 16.2 % — AB (ref 11.5–15.5)
WBC: 6.8 10*3/uL (ref 4.0–10.5)

## 2016-01-31 LAB — TSH: TSH: 2.285 u[IU]/mL (ref 0.350–4.500)

## 2016-01-31 MED ORDER — ONDANSETRON HCL 4 MG/2ML IJ SOLN: 4.0000 mg | Freq: Once | INTRAMUSCULAR | Status: DC

## 2016-01-31 MED ORDER — PROMETHAZINE HCL 25 MG/ML IJ SOLN
25.0000 mg | Freq: Four times a day (QID) | INTRAMUSCULAR | Status: DC | PRN
  Administered 2016-01-31: 25 mg via INTRAVENOUS
  Filled 2016-01-31: qty 1

## 2016-01-31 NOTE — Progress Notes (Signed)
Patient ID: Amy [name], female   DOB: 1961-03-25, 55 y.o.   MRN: [id number]  PROGRESS NOTE    KARLINA SUARES  JJO:[id number] DOB: [birth year] DOA: 01/30/2016  PCP: Velna Hatchet, MD   Brief Narrative:  55 y.o. female with past medical history significant for bilateral blindness, hypertension, hyperlipidemia, diabetes mellitus on insulin, asthma, hypothyroidism, CKD stage 3 who presented to [facility] ED after her caregiver found her at home when she saw  Pt "turned grey" and her SBP dropped to 60s.   In ED, BP was 110/69, orthostatic (+), lactate 1.15, WBC 8.9, stable renal function.   Assessment & Plan:   Principal Problem:   Near syncope - Likely due to hypotension - Obtain PT eval   Active Problems:   Blindness - Stable    CKD stage 3 - Baseline Cr 1.46 in 09/2015 - Cr on this admission within baseline values, 1.19    Uncontrolled DM with diabetic nephropathy with long term insulin use - A1c in 10/2014 was 10.1 indicating poor glycemic control - CBG on admission 174 - No ketone or glucose in urine - Continue Levemir 30 units at bedtime and SSI    Hypothyroidism - TSH WNL - Continue synthroid    Dyslipidemia assocaited with type 2 DM - Continue statin therapy   DVT prophylaxis: Lovenox subQ Code Status: full code  Family Communication: no family at the bedside this am Disposition Plan: home in next 24-48 hours if she feels better.    Consultants:   PT  Procedures:   None   Antimicrobials:   None    Subjective: Very nauseous this am.  Objective: Vitals:   01/30/16 1621 01/30/16 1656 01/30/16 2120 01/31/16 0536  BP: 90/55 (!) 122/59 121/61 (!) 159/73  Pulse: 71 69 70 68  Resp: 18 18 17 18   Temp:  97.7 F (36.5 C) 98.3 F (36.8 C) 98.1 F (36.7 C)  TempSrc:  Oral Oral Oral  SpO2: 100% 100% 98% 98%  Weight:  89 kg (196 lb 3.4 oz)  88 kg (194 lb 0.1 oz)  Height:  4\' 11"  (1.499 m)      Intake/Output Summary (Last 24 hours) at 01/31/16  1128 Last data filed at 01/30/16 2300  Gross per 24 hour  Intake           466.67 ml  Output                0 ml  Net           466.67 ml   Filed Weights   01/30/16 1656 01/31/16 0536  Weight: 89 kg (196 lb 3.4 oz) 88 kg (194 lb 0.1 oz)    Examination:  General exam: Appears calm and comfortable  Respiratory system: Clear to auscultation. Respiratory effort normal. Cardiovascular system: S1 & S2 heard, RRR. No JVD, murmurs, rubs, gallops or clicks. No pedal edema. Gastrointestinal system: Abdomen is nondistended, soft and nontender. No organomegaly or masses felt. Normal bowel sounds heard. Central nervous system: Alert and oriented. No focal neurological deficits. Extremities: Symmetric 5 x 5 power. Skin: No rashes, lesions or ulcers Psychiatry: Judgement and insight appear normal. Mood & affect appropriate.   Data Reviewed: I have personally reviewed following labs and imaging studies  CBC:  Recent Labs Lab 01/30/16 1153 01/31/16 0529  WBC 8.9 6.8  NEUTROABS 6.0  --   HGB 12.4 11.5*  HCT 37.7 35.6*  MCV 76.2* 76.9*  PLT 196 160   Basic Metabolic Panel:  Recent Labs Lab 01/30/16 1153 01/31/16 0529  NA 142 142  K 3.6 4.1  CL 105 109  CO2 30 27  GLUCOSE 126* 205*  BUN 27* 25*  CREATININE 1.19* 1.03*  CALCIUM 9.3 9.0   GFR: Estimated Creatinine Clearance: 58.8 mL/min (by C-G formula based on SCr of 1.03 mg/dL (H)). Liver Function Tests: No results for input(s): AST, ALT, ALKPHOS, BILITOT, PROT, ALBUMIN in the last 168 hours. No results for input(s): LIPASE, AMYLASE in the last 168 hours. No results for input(s): AMMONIA in the last 168 hours. Coagulation Profile: No results for input(s): INR, PROTIME in the last 168 hours. Cardiac Enzymes: No results for input(s): CKTOTAL, CKMB, CKMBINDEX, TROPONINI in the last 168 hours. BNP (last 3 results) No results for input(s): PROBNP in the last 8760 hours. HbA1C: No results for input(s): HGBA1C in the last 72  hours. CBG:  Recent Labs Lab 01/30/16 1121 01/30/16 1700 01/30/16 2127 01/31/16 0809  GLUCAP 174* 228* 277* 162*   Lipid Profile: No results for input(s): CHOL, HDL, LDLCALC, TRIG, CHOLHDL, LDLDIRECT in the last 72 hours. Thyroid Function Tests:  Recent Labs  01/31/16 0529  TSH 2.285   Anemia Panel: No results for input(s): VITAMINB12, FOLATE, FERRITIN, TIBC, IRON, RETICCTPCT in the last 72 hours. Urine analysis:    Component Value Date/Time   COLORURINE YELLOW 01/30/2016 1220   APPEARANCEUR CLOUDY (A) 01/30/2016 1220   LABSPEC 1.011 01/30/2016 1220   PHURINE 5.5 01/30/2016 1220   GLUCOSEU NEGATIVE 01/30/2016 1220   HGBUR NEGATIVE 01/30/2016 1220   BILIRUBINUR NEGATIVE 01/30/2016 1220   KETONESUR NEGATIVE 01/30/2016 1220   PROTEINUR NEGATIVE 01/30/2016 1220   UROBILINOGEN 0.2 01/16/2015 1121   NITRITE NEGATIVE 01/30/2016 1220   LEUKOCYTESUR NEGATIVE 01/30/2016 1220   Sepsis Labs: @LABRCNTIP (procalcitonin:4,lacticidven:4)   )No results found for this or any previous visit (from the past 240 hour(s)).    Radiology Studies: Dg Chest 2 View Result Date: 01/30/2016 1. Upper zone pulmonary vascular prominence is probably due to semi erect positioning rather than pulmonary venous hypertension. There is no cardiomegaly or edema.    Scheduled Meds: . aspirin EC  81 mg Oral Daily  . enoxaparin (LOVENOX) injection  40 mg Subcutaneous Q24H  . insulin aspart  0-9 Units Subcutaneous TID WC  . insulin detemir  30 Units Subcutaneous QHS  . levothyroxine  150 mcg Oral QAC breakfast  . midodrine  5 mg Oral TID  . montelukast  10 mg Oral QHS  . neomycin-polymyxin b-dexamethasone   Both Eyes Daily  . ondansetron (ZOFRAN) IV  4 mg Intravenous Once  . pantoprazole  40 mg Oral Daily  . pregabalin  150 mg Oral TID  . simvastatin  20 mg Oral QHS  . sodium chloride flush  3 mL Intravenous Q12H   Continuous Infusions:    LOS: 0 days    Time spent: 15 minutes  Greater than  50% of the time spent on counseling and coordinating the care.   Leisa Lenz, MD [facility] Pager [phone]  If 7PM-7AM, please contact night-coverage [email or web address] Password [facility] 01/31/2016, 11:28 AM

## 2016-01-31 NOTE — Progress Notes (Signed)
Assumed care of patient. Agree with previous RN shift assessment.  [name]. Brigitte Pulse, RN

## 2016-01-31 NOTE — Progress Notes (Signed)
Occupational Therapy Evaluation Patient Details Name: Amy [name] MRN: [id number] DOB: [birth year] Today's Date: 01/31/2016    History of Present Illness Amy [name] is a 55 y.o. female with medical history significant of bilateral blindness, hypertension, hyperlipidemia, diabetes mellitus, asthma, GERD, hypothyroidism, chronic lower back pain, diastolic congestive heart failure, CKD-II, who presents with near syncope, dizziness and hypotension   Clinical Impression   Patient presents at or very close to baseline with ADLs. She has lived in the same apartment for the past 17 years and knows that environment very well, getting around without her blind cane. She has an aide 7 days/week to assist with ADLs. Recommend return home with resumption of aide services. No further OT needs; will sign off.    Follow Up Recommendations  No OT follow up;Supervision - Intermittent    Equipment Recommendations  None recommended by OT    Recommendations for Other Services       Precautions / Restrictions Precautions Precautions: Fall Precaution Comments: legally blind Restrictions Weight Bearing Restrictions: No      Mobility Bed Mobility               General bed mobility comments: in recliner  Transfers Overall transfer level: Needs assistance Equipment used: Rolling walker (2 wheeled) Transfers: Sit to/from Stand Sit to Stand: Min guard         General transfer comment: Cues for hand placement onto RW, her blind cane not with her.    Balance Overall balance assessment: Needs assistance         Standing balance support: During functional activity;No upper extremity supported Standing balance-Leahy Scale: Fair                              ADL Overall ADL's : Needs assistance/impaired Eating/Feeding: Set up   Grooming: Wash/dry hands;Set up           Upper Body Dressing : Moderate assistance;Standing Upper Body Dressing Details (indicate  cue type and reason): don/doff gown as robe Lower Body Dressing: Maximal assistance Lower Body Dressing Details (indicate cue type and reason): don/doff shoes Toilet Transfer: Min guard;Ambulation;Regular Toilet;Grab bars;RW   Toileting- Water quality scientist and Hygiene: Min guard;Sit to/from stand       Functional mobility during ADLs: Min guard;Rolling walker General ADL Comments: Needed assistance to steer walker due to low vision     Vision     Perception     Praxis      Pertinent Vitals/Pain Pain Assessment: Faces Faces Pain Scale: Hurts little more Pain Location: back Pain Descriptors / Indicators: Aching Pain Intervention(s): Limited activity within patient's tolerance;Monitored during session     Hand Dominance Right   Extremity/Trunk Assessment Upper Extremity Assessment Upper Extremity Assessment: Generalized weakness   Lower Extremity Assessment Lower Extremity Assessment: Defer to PT evaluation RLE Sensation: history of peripheral neuropathy LLE Sensation: history of peripheral neuropathy       Communication Communication Communication: No difficulties   Cognition Arousal/Alertness: Lethargic;Awake/alert Behavior During Therapy: WFL for tasks assessed/performed Overall Cognitive Status: Within Functional Limits for tasks assessed                     General Comments       Exercises       Shoulder Instructions      Home Living Family/patient expects to be discharged to:: Private residence Living Arrangements: Alone Available Help at Discharge: Available PRN/intermittently;Family;Personal care attendant (  Monday-Saturday 7-2, Sunday 7:30-10:00) Type of Home: Apartment Home Access: Level entry     Home Layout: One level     Bathroom Shower/Tub: Teacher, Saketh Daubert years/pre: Standard     Home Equipment: Environmental consultant - 4 wheels;Tub bench;Bedside commode (cane for the blind)   Additional Comments: blind cane;  has PC  assist 6-7  days per week for several hours each day.      Prior Functioning/Environment Level of Independence: Needs assistance  Gait / Transfers Assistance Needed: does not use cane in house, uses  transportation  ADL's / Homemaking Assistance Needed: PC services assist with cooking, bath, housekeeping            OT Problem List: Pain;Cardiopulmonary status limiting activity;Impaired vision/perception   OT Treatment/Interventions:      OT Goals(Current goals can be found in the care plan section) Acute Rehab OT Goals Patient Stated Goal: to return home OT Goal Formulation: All assessment and education complete, DC therapy  OT Frequency:     Barriers to D/C:            Co-evaluation   Reason for Co-Treatment: Complexity of the patient's impairments (multi-system involvement);For patient/therapist safety PT goals addressed during session: Mobility/safety with mobility OT goals addressed during session: ADL's and self-care      End of Session Equipment Utilized During Treatment: Rolling walker  Activity Tolerance: Patient tolerated treatment well Patient left: in chair;with call bell/phone within reach   Time: 1134-1152 OT Time Calculation (min): 18 min Charges:  OT General Charges $OT Visit: 1 Procedure OT Evaluation $OT Eval Low Complexity: 1 Procedure G-Codes: OT G-codes **NOT FOR INPATIENT CLASS** Functional Assessment Tool Used: clinical judgment Functional Limitation: Self care Self Care Current Status (Y0511): At least 20 percent but less than 40 percent impaired, limited or restricted Self Care Goal Status (M2111): At least 20 percent but less than 40 percent impaired, limited or restricted Self Care Discharge Status [phone]): At least 20 percent but less than 40 percent impaired, limited or restricted  Amy [name] A 01/31/2016, 1:18 PM

## 2016-01-31 NOTE — Evaluation (Signed)
Physical Therapy Evaluation Patient Details Name: Amy [name] MRN: [id number] DOB: 1960-12-15 Today's Date: 01/31/2016   History of Present Illness  Amy [name] is a 55 y.o. female with medical history significant of bilateral blindness, hypertension, hyperlipidemia, diabetes mellitus, asthma, GERD, hypothyroidism, chronic lower back pain, diastolic congestive heart failure, CKD-II, who presents with near syncope, dizziness and hypotension  Clinical Impression  The patient reports feeling sleepy, slightly dizzy after ambulation. BP 105/63. The patient  Is independent in  Ambulation in  her own apartment, able to use  specialized requires assistance here due to environment. She should progress to  Level to return home with continued caregivers. Pt admitted with above diagnosis. Pt currently with functional limitations due to the deficits listed below (see PT Problem List).  Pt will benefit from skilled PT to increase their independence and safety with mobility to allow discharge to the venue listed below.        Follow Up Recommendations No PT follow up    Equipment Recommendations  None recommended by PT    Recommendations for Other Services       Precautions / Restrictions Precautions Precautions: Fall Precaution Comments: legally blind      Mobility  Bed Mobility               General bed mobility comments: in recliner  Transfers Overall transfer level: Needs assistance Equipment used: Rolling walker (2 wheeled) Transfers: Sit to/from Stand Sit to Stand: Min guard         General transfer comment: Cues for hand placement onto RW, her blind cane not with her.  Ambulation/Gait Ambulation/Gait assistance: Min assist Ambulation Distance (Feet): 90 Feet Assistive device: Rolling walker (2 wheeled) Gait Pattern/deviations: Step-through pattern;Steppage     General Gait Details: foot slap , assist to guide Rw.  Stairs            Wheelchair Mobility    Modified Rankin (Stroke Patients Only)       Balance Overall balance assessment: Needs assistance         Standing balance support: During functional activity;No upper extremity supported Standing balance-Leahy Scale: Fair                               Pertinent Vitals/Pain Pain Assessment: Faces Faces Pain Scale: Hurts little more Pain Location: back Pain Descriptors / Indicators: Aching    Home Living Family/patient expects to be discharged to:: Private residence Living Arrangements: Alone Available Help at Discharge: Personal care attendant;Family;Available PRN/intermittently Type of Home: Apartment Home Access: Level entry     Home Layout: One level Home Equipment: Other (comment);Bedside commode;Walker - 4 wheels;Tub bench (for the blind) Additional Comments: blind cane;  has PC  assist 6-7 days per week for several hours each day.    Prior Function Level of Independence: Needs assistance   Gait / Transfers Assistance Needed: does not use cane in house, uses  transportation   ADL's / Homemaking Assistance Needed: PC services assist with cooking, bath, housekeeping        Hand Dominance        Extremity/Trunk Assessment   Upper Extremity Assessment: Defer to OT evaluation           Lower Extremity Assessment: Generalized weakness;RLE deficits/detail;LLE deficits/detail         Communication      Cognition Arousal/Alertness: Lethargic;Awake/alert Behavior During Therapy: WFL for tasks assessed/performed Overall Cognitive Status: Within Functional  Limits for tasks assessed                      General Comments      Exercises     Assessment/Plan    PT Assessment Patient needs continued PT services  PT Problem List Decreased strength;Decreased activity tolerance;Decreased balance;Decreased mobility;Pain;Decreased knowledge of precautions;Decreased safety awareness;Decreased knowledge of use of DME          PT  Treatment Interventions DME instruction;Gait training;Functional mobility training;Therapeutic activities;Patient/family education    PT Goals (Current goals can be found in the Care Plan section)  Acute Rehab PT Goals Patient Stated Goal: to return home PT Goal Formulation: With patient Time For Goal Achievement: 02/14/16 Potential to Achieve Goals: Good    Frequency Min 3X/week   Barriers to discharge        Co-evaluation PT/OT/SLP Co-Evaluation/Treatment: Yes Reason for Co-Treatment: Complexity of the patient's impairments (multi-system involvement);For patient/therapist safety PT goals addressed during session: Mobility/safety with mobility OT goals addressed during session: ADL's and self-care       End of Session   Activity Tolerance: Patient tolerated treatment well Patient left: in chair;with call bell/phone within reach Nurse Communication: Mobility status    Functional Assessment Tool Used: clinical judgement Functional Limitation: Mobility: Walking and moving around Mobility: Walking and Moving Around Current Status (X8338): At least 1 percent but less than 20 percent impaired, limited or restricted Mobility: Walking and Moving Around Goal Status [phone]): 0 percent impaired, limited or restricted    Time: 1134-1153 PT Time Calculation (min) (ACUTE ONLY): 19 min   Charges:         PT G Codes:   PT G-Codes **NOT FOR INPATIENT CLASS** Functional Assessment Tool Used: clinical judgement Functional Limitation: Mobility: Walking and moving around Mobility: Walking and Moving Around Current Status (Z7673): At least 1 percent but less than 20 percent impaired, limited or restricted Mobility: Walking and Moving Around Goal Status [phone]): 0 percent impaired, limited or restricted    Claretha Cooper 01/31/2016, 12:47 PM

## 2016-01-31 NOTE — Care Management Obs Status (Signed)
[facility] NOTIFICATION   Patient Details  Name: Amy [name] MRN: [id number] Date of Birth: 1960/07/18   Medicare Observation Status Notification Given:  Yes    Erenest Rasher, RN 01/31/2016, 2:14 PM

## 2016-01-31 NOTE — Care Management Note (Addendum)
01/31/2016 1630 NCM contacted [facility] with HH RN orders. Contacted [facility] for DME wheelchair and shower chair. Jonnie Finner RN CCM Case Mgmt phone [phone]  Case Management Note  Patient Details  Name: Amy [name] MRN: [id number] Date of Birth: 1960/07/03  Subjective/Objective:      Legally blind, near syncope, hypotension, dM             Action/Plan: Discharge Planning:   NCM spoke to pt and sister, Kiely Cousar at bedside. Pt states she lives alone but has [place] [facility] aide that comes 7 days, M-Sat 8-2 pm and Sun 7:30-10:30 am with [facility]. Pt states she had [facility] in the past for [facility]. Pt states she has a difficult time getting on the [facility] bus. Requesting wheelchair for home. Also requesting shower chair with back. She has RW at home. Will contact [facility] for DME for home.   Patrick Jupiter MD    Expected Discharge Date:  02/01/2016              Expected Discharge Plan:  Home/Self Care  In-House Referral:  NA  Discharge planning Services  CM Consult  Post Acute Care Choice:  NA Choice offered to:  NA  DME Arranged:  Lightweight manual wheelchair with seat cushion, Shower stool DME Agency:  [place]:  NA [place] Agency:  NA  Status of Service:  Completed, signed off  If discussed at [place] of Stay Meetings, dates discussed:    Additional Comments:  Erenest Rasher, RN 01/31/2016, 2:21 PM

## 2016-02-01 DIAGNOSIS — E785 Hyperlipidemia, unspecified: Secondary | ICD-10-CM | POA: Diagnosis not present

## 2016-02-01 DIAGNOSIS — R55 Syncope and collapse: Secondary | ICD-10-CM | POA: Diagnosis not present

## 2016-02-01 DIAGNOSIS — E038 Other specified hypothyroidism: Secondary | ICD-10-CM | POA: Diagnosis not present

## 2016-02-01 LAB — GLUCOSE, CAPILLARY
GLUCOSE-CAPILLARY: 265 mg/dL — AB (ref 65–99)
Glucose-Capillary: 203 mg/dL — ABNORMAL HIGH (ref 65–99)

## 2016-02-01 LAB — HEMOGLOBIN A1C
HEMOGLOBIN A1C: 9.9 % — AB (ref 4.8–5.6)
Mean Plasma Glucose: 237 mg/dL

## 2016-02-01 MED ORDER — ACETAMINOPHEN 325 MG PO TABS: 650.0000 mg | ORAL_TABLET | Freq: Four times a day (QID) | ORAL | 0 refills | Status: DC | PRN

## 2016-02-01 MED ORDER — PROMETHAZINE HCL 12.5 MG PO TABS: 12.5000 mg | ORAL_TABLET | Freq: Three times a day (TID) | ORAL | 0 refills | Status: DC | PRN

## 2016-02-01 NOTE — Progress Notes (Signed)
Physical Therapy Treatment Patient Details Name: Amy [name] MRN: [id number] DOB: 1961/02/02 Today's Date: 02/01/2016    History of Present Illness Amy [name] is a 55 y.o. female with medical history significant of bilateral blindness, hypertension, hyperlipidemia, diabetes mellitus, asthma, GERD, hypothyroidism, chronic lower back pain, diastolic congestive heart failure, CKD-II, who presents with near syncope, dizziness and hypotension    PT Comments    Pt had received w/c to hospital room so agreeable to start mobility/education for w/c propulsion.  Pt states her aide can assist her with tighter spaces.  Educated pt to have aide assist with leg rests for now as pt with difficulty reaching release.    Follow Up Recommendations  No PT follow up     Equipment Recommendations  None recommended by PT    Recommendations for Other Services       Precautions / Restrictions Precautions Precautions: Fall Precaution Comments: legally blind    Mobility  Bed Mobility Overal bed mobility: Modified Independent                Transfers Overall transfer level: Needs assistance Equipment used: None Transfers: Sit to/from Stand;Stand Pivot Transfers Sit to Stand: Supervision Stand pivot transfers: Supervision       General transfer comment: tactile cues for hand placement due to visual impairment, had pt feel w/c and lock brakes prior to transferring  Ambulation/Gait                 Hotel manager mobility: Yes Wheelchair propulsion: Both upper extremities Wheelchair parts: Needs assistance Distance: 80 Wheelchair Assistance Details (indicate cue type and reason): pt requires assist for negotiating tight spaces so brought into hallway and pt able to propel w/c with UEs (height of w/c and cushion elevated pt so feet cannot reach floor) and verbal cues for environment, pt taught how to tightly turn  w/c   Modified Rankin (Stroke Patients Only)       Balance                                    Cognition Arousal/Alertness: Awake/alert Behavior During Therapy: WFL for tasks assessed/performed Overall Cognitive Status: Within Functional Limits for tasks assessed                      Exercises      General Comments        Pertinent Vitals/Pain Pain Assessment: No/denies pain    Home Living                      Prior Function            PT Goals (current goals can now be found in the care plan section) Progress towards PT goals: Progressing toward goals    Frequency    Min 3X/week      PT Plan Current plan remains appropriate    Co-evaluation             End of Session   Activity Tolerance: Patient tolerated treatment well Patient left: in chair;with call bell/phone within reach     Time: 7425-9563 PT Time Calculation (min) (ACUTE ONLY): 20 min  Charges:  $Wheel Chair Management: 8-22 mins  G Codes:      Alden Bensinger,KATHrine E Feb 21, 2016, 1:05 PM Carmelia Bake, PT, DPT 02/21/16 Pager: [phone]

## 2016-02-01 NOTE — Discharge Instructions (Addendum)

## 2016-02-01 NOTE — Discharge Summary (Signed)
Physician Discharge Summary  Amy [name] ERX:[id number] DOB: January 18, 1961 DOA: 01/30/2016  PCP: Velna Hatchet, MD  Admit date: 01/30/2016 Discharge date: 02/01/2016  Recommendations for Outpatient Follow-up:  Please hold Lasix for next 1 week or until seen by primary care physician who can recheck your kidney function and determine if it safe to resume Lasix. Her creatinine prior to discharge is 1.03, slightly above the normal limit. Would recommend to hold irbesartan as blood pressure controlled well with Midodrin.  Discharge Diagnoses:  Principal Problem:   Near syncope Active Problems:   Blindness   Asthma   Hypothyroidism   DM (diabetes mellitus), type 2, uncontrolled, with hyperosmolarity (HCC)   Diabetes mellitus with neurological manifestations ([place])   Hyperlipidemia LDL goal <70   Hypotension    Discharge Condition: stable   Diet recommendation: as tolerated   History of present illness:  55 y.o.femalewith past medical history significant for bilateral blindness, hypertension, hyperlipidemia, diabetes mellitus on insulin, asthma, hypothyroidism, CKD stage 3 who presented to [facility] ED after her caregiver found her at home when she saw  Pt "turned grey" and her SBP dropped to 60s.   In ED, BP was 110/69, orthostatic (+), lactate 1.15, WBC 8.9, stable renal function.    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 - A1c in 10/2014 was 10.1 indicating poor glycemic control - Continue home insulin regimen     Hypothyroidism - TSH WNL - Continue synthroid    Dyslipidemia assocaited with type 2 DM - Continue statin  therapy   DVT prophylaxis: Lovenox subQ Code Status: full code  Family Communication: no family at the bedside this am    Consultants:   PT  Procedures:   None   Antimicrobials:   None    Signed:  Leisa Lenz, MD  [facility] 02/01/2016, 11:19 AM  Pager #: [phone]  Time spent in minutes: less than 30 minutes    Discharge Exam: Vitals:   01/31/16 2156 02/01/16 0527  BP: 124/74 126/76  Pulse: 65 74  Resp: 18 18  Temp: 97.8 F (36.6 C) 98.7 F (37.1 C)   Vitals:   01/31/16 0536 01/31/16 1259 01/31/16 2156 02/01/16 0527  BP: (!) 159/73 99/63 124/74 126/76  Pulse: 68 66 65 74  Resp: 18 18 18 18   Temp: 98.1 F (36.7 C) 97.7 F (36.5 C) 97.8 F (36.6 C) 98.7 F (37.1 C)  TempSrc: Oral Oral Oral Oral  SpO2: 98% 96% 96% 95%  Weight: 88 kg (194 lb 0.1 oz)   (P) 87.7 kg (193 lb 5.5 oz)  Height:        General: Pt is alert, follows commands appropriately, not in acute distress Cardiovascular: Regular rate and rhythm, S1/S2 +, no murmurs Respiratory: Clear to auscultation bilaterally, no wheezing, no crackles, no rhonchi Abdominal: Soft, non tender, non distended, bowel sounds +, no guarding Extremities: no edema, no cyanosis, pulses palpable bilaterally DP and PT Neuro: Grossly nonfocal  Discharge Instructions  Discharge Instructions    Call MD for:  persistant nausea and vomiting    Complete by:  As directed  Call MD for:  redness, tenderness, or signs of infection (pain, swelling, redness, odor or green/yellow discharge around incision site)    Complete by:  As directed    Call MD for:  severe uncontrolled pain    Complete by:  As directed    Diet - low sodium heart healthy    Complete by:  As directed    Discharge instructions    Complete by:  As directed    Please hold Lasix for next 1 week or until seen by primary care physician who can recheck your kidney function and determine if it safe to resume Lasix. Her creatinine  prior to discharge is 1.03, slightly above the normal limit. Would recommend to hold irbesartan as blood pressure controlled well with Midodrin.   Increase activity slowly    Complete by:  As directed        Medication List    STOP taking these medications   furosemide 20 MG tablet Commonly known as:  LASIX   irbesartan 75 MG tablet Commonly known as:  AVAPRO     TAKE these medications   acetaminophen 325 MG tablet Commonly known as:  TYLENOL Take 2 tablets (650 mg total) by mouth every 6 (six) hours as needed for mild pain (or Fever >/= 101).   albuterol 108 (90 Base) MCG/ACT inhaler Commonly known as:  PROVENTIL HFA;VENTOLIN HFA Inhale 2 puffs into the lungs every 4 (four) hours as needed for wheezing or shortness of breath. For short   aspirin EC 81 MG tablet Take 81 mg by mouth daily.   clobetasol ointment 0.05 % Commonly known as:  TEMOVATE Apply 1 application topically 2 (two) times daily as needed (dermatosis).   esomeprazole 40 MG capsule Commonly known as:  NEXIUM Take 40 mg by mouth 2 (two) times daily before a meal.   fluorometholone 0.25 % ophthalmic suspension Commonly known as:  FML FORTE Place 1 drop into the right eye 2 (two) times daily.   HUMALOG KWIKPEN 100 UNIT/ML KiwkPen Generic drug:  insulin lispro Inject 8-14 Units into the skin 3 (three) times daily. 8 units with breakfast 10 units with lunch and 14 units with dinner   LEVEMIR FLEXTOUCH 100 UNIT/ML Pen Generic drug:  Insulin Detemir Inject 45 Units into the skin every evening.   levothyroxine 150 MCG tablet Commonly known as:  SYNTHROID, LEVOTHROID Take 150 mcg by mouth daily.   LINZESS 145 MCG Caps capsule Generic drug:  linaclotide Take 145 mcg by mouth daily as needed (constipation).   LYRICA 150 MG capsule Generic drug:  pregabalin Take 150 mg by mouth 3 (three) times daily.   midodrine 5 MG tablet Commonly known as:  PROAMATINE Take 5 mg by mouth 3 (three) times daily.    montelukast 10 MG tablet Commonly known as:  SINGULAIR Take 10 mg by mouth at bedtime.   neomycin-polymyxin b-dexamethasone 3.5-10000-0.1 Oint Commonly known as:  MAXITROL Use 1/4" ribbon to affected eye once a day as needed for antibiotic. If you develop allergy symptoms, stop & call our office.   promethazine 12.5 MG tablet Commonly known as:  PHENERGAN Take 1 tablet (12.5 mg total) by mouth every 8 (eight) hours as needed for nausea or vomiting.   simvastatin 20 MG tablet Commonly known as:  ZOCOR Take 20 mg by mouth at bedtime.   TRULICITY 5.78 IO/9.6EX Sopn Generic drug:  Dulaglutide Inject 0.5 mLs into the skin once a week.      Follow-up Information  [facility] .   Why:  Home Health RN, phsyical therapy, occupational therapy Contact information: [place] [address] [phone]        Velna Hatchet, MD. Schedule an appointment as soon as possible for a visit in 1 week(s).   Specialty:  Internal Medicine Contact information: [place] [address] [phone]            The results of significant diagnostics from this hospitalization (including imaging, microbiology, ancillary and laboratory) are listed below for reference.    Significant Diagnostic Studies: Dg Chest 2 View  Result Date: 01/30/2016 CLINICAL DATA:  Hypotension.  History of asthma and diabetes. EXAM: CHEST  2 VIEW COMPARISON:  09/15/2015 FINDINGS: Thoracic spondylosis. Heart size within normal limits for projection. Mild upper zone pulmonary vascular prominence similar to prior, but this is nonspecific due due semi erect positioning. No airspace opacity identified.  No pleural effusion. IMPRESSION: 1. Upper zone pulmonary vascular prominence is probably due to semi erect positioning rather than pulmonary venous hypertension. There is no cardiomegaly or edema. Electronically Signed   By: Van Clines M.D.   On: 01/30/2016 12:07     Microbiology: Recent Results (from the past 240 hour(s))  Urine culture     Status: Abnormal   Collection Time: 01/30/16 12:20 PM  Result Value Ref Range Status   Specimen Description URINE, CLEAN CATCH  Final   Special Requests NONE  Final   Culture MULTIPLE SPECIES PRESENT, SUGGEST RECOLLECTION (A)  Final   Report Status 01/31/2016 FINAL  Final     Labs: Basic Metabolic Panel:  Recent Labs Lab 01/30/16 1153 01/31/16 0529  NA 142 142  K 3.6 4.1  CL 105 109  CO2 30 27  GLUCOSE 126* 205*  BUN 27* 25*  CREATININE 1.19* 1.03*  CALCIUM 9.3 9.0   Liver Function Tests: No results for input(s): AST, ALT, ALKPHOS, BILITOT, PROT, ALBUMIN in the last 168 hours. No results for input(s): LIPASE, AMYLASE in the last 168 hours. No results for input(s): AMMONIA in the last 168 hours. CBC:  Recent Labs Lab 01/30/16 1153 01/31/16 0529  WBC 8.9 6.8  NEUTROABS 6.0  --   HGB 12.4 11.5*  HCT 37.7 35.6*  MCV 76.2* 76.9*  PLT 196 163   Cardiac Enzymes: No results for input(s): CKTOTAL, CKMB, CKMBINDEX, TROPONINI in the last 168 hours. BNP: BNP (last 3 results)  Recent Labs  08/26/15 2040 01/30/16 1630  BNP 56.5 42.1    ProBNP (last 3 results) No results for input(s): PROBNP in the last 8760 hours.  CBG:  Recent Labs Lab 01/31/16 0809 01/31/16 1133 01/31/16 1712 01/31/16 2153 02/01/16 0721  GLUCAP 162* 213* 283* 257* 203*

## 2016-03-30 ENCOUNTER — Other Ambulatory Visit: Payer: Self-pay | Admitting: Internal Medicine

## 2016-03-30 DIAGNOSIS — Z1231 Encounter for screening mammogram for malignant neoplasm of breast: Secondary | ICD-10-CM

## 2016-04-15 ENCOUNTER — Encounter: Payer: Self-pay | Admitting: Podiatry

## 2016-04-15 ENCOUNTER — Ambulatory Visit (INDEPENDENT_AMBULATORY_CARE_PROVIDER_SITE_OTHER): Payer: Medicare Other | Admitting: Podiatry

## 2016-04-15 VITALS — Ht 59.0 in | Wt 193.0 lb

## 2016-04-15 DIAGNOSIS — B351 Tinea unguium: Secondary | ICD-10-CM | POA: Diagnosis not present

## 2016-04-15 DIAGNOSIS — E1140 Type 2 diabetes mellitus with diabetic neuropathy, unspecified: Secondary | ICD-10-CM | POA: Diagnosis not present

## 2016-04-15 DIAGNOSIS — M79676 Pain in unspecified toe(s): Secondary | ICD-10-CM

## 2016-04-15 DIAGNOSIS — Z794 Long term (current) use of insulin: Secondary | ICD-10-CM

## 2016-04-15 NOTE — Progress Notes (Signed)
Patient ID: Amy [name], female   DOB: [birth year], 55 y.o.   MRN: [id number] Complaint:  Visit Type: Patient returns to my office for continued preventative foot care services. Complaint: Patient states" my nails have grown long and thick and become painful to walk and wear shoes" Patient has been diagnosed with DM with neuropathy.. The patient presents for preventative foot care services. No changes to ROS  Podiatric Exam: Vascular: dorsalis pedis and posterior tibial pulses are palpable bilateral. Capillary return is immediate. Temperature gradient is WNL. Skin turgor WNL  Sensorium: Normal Semmes Weinstein monofilament test. Normal tactile sensation bilaterally. Nail Exam: Pt has thick disfigured discolored nails with subungual debris noted bilateral entire nail hallux through fifth toenails Ulcer Exam: There is no evidence of ulcer or pre-ulcerative changes or infection. Orthopedic Exam: Muscle tone and strength are WNL. No limitations in general ROM. No crepitus or effusions noted. Foot type and digits show no abnormalities. Bony prominences are unremarkable. Skin: No Porokeratosis. No infection or ulcers.  Asymptomatic plantar fibroma left arch.  Diagnosis:  Onychomycosis, , Pain in right toe, pain in left toes  Treatment & Plan Procedures and Treatment: Consent by patient was obtained for treatment procedures. The patient understood the discussion of treatment and procedures well. All questions were answered thoroughly reviewed. Debridement of mycotic and hypertrophic toenails, 1 through 5 bilateral and clearing of subungual debris. No ulceration, no infection noted.  Return Visit-Office Procedure: Patient instructed to return to the office for a follow up visit 10 weeks  for continued evaluation and treatment.    Gardiner Barefoot DPM

## 2016-06-02 ENCOUNTER — Ambulatory Visit: Payer: Medicare Other

## 2016-06-24 ENCOUNTER — Ambulatory Visit (INDEPENDENT_AMBULATORY_CARE_PROVIDER_SITE_OTHER): Payer: Medicare Other | Admitting: Podiatry

## 2016-06-24 ENCOUNTER — Encounter: Payer: Self-pay | Admitting: Podiatry

## 2016-06-24 DIAGNOSIS — B351 Tinea unguium: Secondary | ICD-10-CM

## 2016-06-24 DIAGNOSIS — M79676 Pain in unspecified toe(s): Secondary | ICD-10-CM

## 2016-06-24 DIAGNOSIS — Z794 Long term (current) use of insulin: Secondary | ICD-10-CM

## 2016-06-24 DIAGNOSIS — E1140 Type 2 diabetes mellitus with diabetic neuropathy, unspecified: Secondary | ICD-10-CM

## 2016-06-24 NOTE — Progress Notes (Signed)
Patient ID: Amy [name], female   DOB: March 17, 1961, 56 y.o.   MRN: [id number] Complaint:  Visit Type: Patient returns to my office for continued preventative foot care services. Complaint: Patient states" my nails have grown long and thick and become painful to walk and wear shoes" Patient has been diagnosed with DM with neuropathy.. The patient presents for preventative foot care services. No changes to ROS  Podiatric Exam: Vascular: dorsalis pedis and posterior tibial pulses are palpable bilateral. Capillary return is immediate. Temperature gradient is WNL. Skin turgor WNL  Sensorium: Normal Semmes Weinstein monofilament test. Normal tactile sensation bilaterally. Nail Exam: Pt has thick disfigured discolored nails with subungual debris noted bilateral entire nail hallux through fifth toenails Ulcer Exam: There is no evidence of ulcer or pre-ulcerative changes or infection. Orthopedic Exam: Muscle tone and strength are WNL. No limitations in general ROM. No crepitus or effusions noted. Foot type and digits show no abnormalities. Bony prominences are unremarkable. Skin: No Porokeratosis. No infection or ulcers.  Asymptomatic plantar fibroma left arch.  Diagnosis:  Onychomycosis, , Pain in right toe, pain in left toes  Treatment & Plan Procedures and Treatment: Consent by patient was obtained for treatment procedures. The patient understood the discussion of treatment and procedures well. All questions were answered thoroughly reviewed. Debridement of mycotic and hypertrophic toenails, 1 through 5 bilateral and clearing of subungual debris. No ulceration, no infection noted.  Return Visit-Office Procedure: Patient instructed to return to the office for a follow up visit 10 weeks  for continued evaluation and treatment.    Gardiner Barefoot DPM

## 2016-07-03 ENCOUNTER — Emergency Department (HOSPITAL_COMMUNITY): Payer: Medicare Other

## 2016-07-03 ENCOUNTER — Emergency Department (HOSPITAL_COMMUNITY)
Admission: EM | Admit: 2016-07-03 | Discharge: 2016-07-03 | Disposition: A | Discharge status: Home / Self Care | Payer: Medicare Other | Attending: Emergency Medicine | Admitting: Emergency Medicine

## 2016-07-03 ENCOUNTER — Encounter (HOSPITAL_COMMUNITY): Payer: Self-pay | Admitting: Emergency Medicine

## 2016-07-03 DIAGNOSIS — Z87891 Personal history of nicotine dependence: Secondary | ICD-10-CM | POA: Insufficient documentation

## 2016-07-03 DIAGNOSIS — E039 Hypothyroidism, unspecified: Secondary | ICD-10-CM | POA: Diagnosis not present

## 2016-07-03 DIAGNOSIS — N189 Chronic kidney disease, unspecified: Secondary | ICD-10-CM | POA: Insufficient documentation

## 2016-07-03 DIAGNOSIS — R600 Localized edema: Secondary | ICD-10-CM | POA: Insufficient documentation

## 2016-07-03 DIAGNOSIS — I129 Hypertensive chronic kidney disease with stage 1 through stage 4 chronic kidney disease, or unspecified chronic kidney disease: Secondary | ICD-10-CM | POA: Insufficient documentation

## 2016-07-03 DIAGNOSIS — M7989 Other specified soft tissue disorders: Secondary | ICD-10-CM | POA: Diagnosis present

## 2016-07-03 DIAGNOSIS — J45909 Unspecified asthma, uncomplicated: Secondary | ICD-10-CM | POA: Diagnosis not present

## 2016-07-03 DIAGNOSIS — Z79899 Other long term (current) drug therapy: Secondary | ICD-10-CM | POA: Insufficient documentation

## 2016-07-03 DIAGNOSIS — R0602 Shortness of breath: Secondary | ICD-10-CM | POA: Insufficient documentation

## 2016-07-03 DIAGNOSIS — Z7982 Long term (current) use of aspirin: Secondary | ICD-10-CM | POA: Diagnosis not present

## 2016-07-03 DIAGNOSIS — E1140 Type 2 diabetes mellitus with diabetic neuropathy, unspecified: Secondary | ICD-10-CM | POA: Insufficient documentation

## 2016-07-03 DIAGNOSIS — R609 Edema, unspecified: Secondary | ICD-10-CM

## 2016-07-03 LAB — COMPREHENSIVE METABOLIC PANEL
ALT: 15 U/L (ref 14–54)
ANION GAP: 6 (ref 5–15)
AST: 16 U/L (ref 15–41)
Albumin: 3.1 g/dL — ABNORMAL LOW (ref 3.5–5.0)
Alkaline Phosphatase: 100 U/L (ref 38–126)
BUN: 30 mg/dL — AB (ref 6–20)
CHLORIDE: 110 mmol/L (ref 101–111)
CO2: 26 mmol/L (ref 22–32)
Calcium: 8.7 mg/dL — ABNORMAL LOW (ref 8.9–10.3)
Creatinine, Ser: 1.04 mg/dL — ABNORMAL HIGH (ref 0.44–1.00)
GFR, EST NON AFRICAN AMERICAN: 58 mL/min — AB (ref >60)
Glucose, Bld: 200 mg/dL — ABNORMAL HIGH (ref 65–99)
POTASSIUM: 3.8 mmol/L (ref 3.5–5.1)
Sodium: 142 mmol/L (ref 135–145)
Total Bilirubin: 0.5 mg/dL (ref 0.3–1.2)
Total Protein: 6.6 g/dL (ref 6.5–8.1)

## 2016-07-03 LAB — CBC WITH DIFFERENTIAL/PLATELET
BASOS ABS: 0 10*3/uL (ref 0.0–0.1)
Basophils Relative: 0 %
Eosinophils Absolute: 0.1 10*3/uL (ref 0.0–0.7)
Eosinophils Relative: 1 %
HCT: 33.9 % — ABNORMAL LOW (ref 36.0–46.0)
HEMOGLOBIN: 11.7 g/dL — AB (ref 12.0–15.0)
LYMPHS ABS: 1.9 10*3/uL (ref 0.7–4.0)
Lymphocytes Relative: 20 %
MCH: 26.2 pg (ref 26.0–34.0)
MCHC: 34.5 g/dL (ref 30.0–36.0)
MCV: 76 fL — AB (ref 78.0–100.0)
Monocytes Absolute: 0.8 10*3/uL (ref 0.1–1.0)
Monocytes Relative: 8 %
NEUTROS ABS: 6.7 10*3/uL (ref 1.7–7.7)
Neutrophils Relative %: 71 %
Platelets: 164 10*3/uL (ref 150–400)
RBC: 4.46 MIL/uL (ref 3.87–5.11)
RDW: 15 % (ref 11.5–15.5)
WBC: 9.4 10*3/uL (ref 4.0–10.5)

## 2016-07-03 LAB — URINALYSIS, ROUTINE W REFLEX MICROSCOPIC
BILIRUBIN URINE: NEGATIVE
Glucose, UA: NEGATIVE mg/dL
KETONES UR: NEGATIVE mg/dL
LEUKOCYTES UA: NEGATIVE
NITRITE: NEGATIVE
PH: 5 (ref 5.0–8.0)
Protein, ur: NEGATIVE mg/dL
Specific Gravity, Urine: 1.009 (ref 1.005–1.030)

## 2016-07-03 LAB — BRAIN NATRIURETIC PEPTIDE: B Natriuretic Peptide: 55.4 pg/mL (ref 0.0–100.0)

## 2016-07-03 LAB — TROPONIN I

## 2016-07-03 MED ORDER — ACETAMINOPHEN 500 MG PO TABS
1000.0000 mg | ORAL_TABLET | Freq: Once | ORAL | Status: AC
  Administered 2016-07-03: 1000 mg via ORAL
  Filled 2016-07-03: qty 2

## 2016-07-03 MED ORDER — FUROSEMIDE 10 MG/ML IJ SOLN
80.0000 mg | Freq: Once | INTRAMUSCULAR | Status: AC
  Administered 2016-07-03: 80 mg via INTRAVENOUS
  Filled 2016-07-03: qty 8

## 2016-07-03 NOTE — ED Provider Notes (Signed)
[facility] DEPT Provider Note   CSN: [id number] Arrival date & time: 07/03/16  1207     History   Chief Complaint Chief Complaint  Patient presents with  . Leg Swelling    HPI Amy [name] is a 56 y.o. female.  HPI   Pt with hx DM, HTN, HLD, CKD spinal stenosis, asthma, blindness p/w 2-3 days of peripheral edema including both legs and her abdomen, SOB, DOE, occasional dizziness with walking.  Has had decreased urinary output.  Takes Lasix 40mg  BID and took an extra one yesterday morning with no increase in urination.  She also notes she has had a chronic knot in the arch of her left foot that has been nonpainful, became larger and more painful two days ago.   Is followed by podiatry for this nodule.  Denies fevers, myalgias, cough, chest pain.    PCP  Dr Ardeth Perfect, [facility] Cardiologist Dr Nadyne Coombes  Pt had Echo 06/2015 demonstrated grade 2 diastolic dysfunction, normal systolic function, EF 47-65%  Past Medical History:  Diagnosis Date  . Asthma   . Blind   . Blindness and low vision    left eye glass eye,  legally blind in right eye  . Diabetes mellitus   . Diabetic neuropathy ([place])   . Hyperlipidemia   . Hypertension   . Hypothyroidism   . Spinal stenosis     Patient Active Problem List   Diagnosis Date Noted  . Near syncope 01/30/2016  . SOB (shortness of breath)   . CAP (community acquired pneumonia) 09/12/2015  . Hypoxia 09/12/2015  . Hypertension 07/05/2015  . Hypotension 07/05/2015  . Diabetes mellitus with neurological manifestations ([place]) 11/04/2014  . Hyperlipidemia LDL goal <70 11/04/2014  . Spinal stenosis, multilevel 11/04/2014  . Hyperkalemia 11/01/2014  . Hypoglycemia 08/09/2014  . DM (diabetes mellitus), type 2, uncontrolled, with hyperosmolarity ([place]) 08/08/2014  . Elevated troponin 08/08/2014  . Nausea vomiting and diarrhea 08/08/2014  . Dizziness 08/06/2012  . Orthostatic hypotension 08/06/2012  . Hypernatremia 08/06/2012  .  Acute gastroenteritis 08/13/2011  . Gastroparesis 08/13/2011  . Chest pain 08/13/2011  . DM (diabetes mellitus) ([place]) 08/13/2011  . Oral thrush 08/13/2011  . Gastroenteritis 05/23/2011  . Dehydration 05/23/2011  . Blindness 05/23/2011  . DM type 1, not at goal, causing eye disease ([place]) 05/23/2011  . Asthma 05/23/2011  . Diarrhea 05/23/2011  . Vomiting 05/23/2011  . Hypothyroidism 05/23/2011  . Diabetic neuropathy ([place]) 05/23/2011  . Diabetic nephropathy ([place]) 05/23/2011    Past Surgical History:  Procedure Laterality Date  . ABDOMINAL HYSTERECTOMY  1990  . [place]   x 2  . CHOLECYSTECTOMY  2008  . ENUCLEATION Bilateral 09/15/1998  . EYE SURGERY  2016   fitting artificial eye    OB History    Obstetric Comments   Pt has two sons.       Home Medications    Prior to Admission medications   Medication Sig Start Date End Date Taking? Authorizing Provider  acetaminophen (TYLENOL) 325 MG tablet Take 2 tablets (650 mg total) by mouth every 6 (six) hours as needed for mild pain (or Fever >/= 101). 02/01/16   Robbie Lis, MD  albuterol (PROVENTIL HFA;VENTOLIN HFA) 108 (90 BASE) MCG/ACT inhaler Inhale 2 puffs into the lungs every 4 (four) hours as needed for wheezing or shortness of breath. For short    Historical Provider, MD  aspirin EC 81 MG tablet Take 81 mg by mouth daily.  Historical Provider, MD  clobetasol ointment (TEMOVATE) 3.54 % Apply 1 application topically 2 (two) times daily as needed (dermatosis).  06/05/15   Historical Provider, MD  Dulaglutide (TRULICITY) 6.56 CL/2.7NT SOPN Inject 0.5 mLs into the skin once a week.     Historical Provider, MD  esomeprazole (NEXIUM) 40 MG capsule Take 40 mg by mouth 2 (two) times daily before a meal.     Historical Provider, MD  fluorometholone (FML FORTE) 0.25 % ophthalmic suspension Place 1 drop into the right eye 2 (two) times daily.    Historical Provider, MD  HUMALOG KWIKPEN 100 UNIT/ML KiwkPen Inject  8-14 Units into the skin 3 (three) times daily. 8 units with breakfast 10 units with lunch and 14 units with dinner 12/26/13   Historical Provider, MD  LEVEMIR FLEXTOUCH 100 UNIT/ML Pen Inject 45 Units into the skin every evening. 09/21/15   Eugenie Filler, MD  levothyroxine (SYNTHROID, LEVOTHROID) 150 MCG tablet Take 150 mcg by mouth daily. 10/28/14   Historical Provider, MD  Linaclotide Rolan Lipa) 145 MCG CAPS capsule Take 145 mcg by mouth daily as needed (constipation).     Historical Provider, MD  LYRICA 150 MG capsule Take 150 mg by mouth 3 (three) times daily. 10/28/14   Historical Provider, MD  midodrine (PROAMATINE) 5 MG tablet Take 5 mg by mouth 3 (three) times daily.    Historical Provider, MD  montelukast (SINGULAIR) 10 MG tablet Take 10 mg by mouth at bedtime.    Historical Provider, MD  neomycin-polymyxin b-dexamethasone (MAXITROL) 3.5-10000-0.1 OINT Use 1/4" ribbon to affected eye once a day as needed for antibiotic. If you develop allergy symptoms, stop & call our office. 05/13/15   Historical Provider, MD  promethazine (PHENERGAN) 12.5 MG tablet Take 1 tablet (12.5 mg total) by mouth every 8 (eight) hours as needed for nausea or vomiting. 02/01/16   Robbie Lis, MD  simvastatin (ZOCOR) 20 MG tablet Take 20 mg by mouth at bedtime.     Historical Provider, MD    Family History Family History  Problem Relation Age of Onset  . Diabetes Sister   . Glaucoma Sister   . Hypertension Sister   . Stroke Brother   . Heart attack Paternal Aunt     Social History Social History  Substance Use Topics  . Smoking status: Former Smoker    Types: Cigarettes    Quit date: 05/22/1992  . Smokeless tobacco: Never Used  . Alcohol use No     Comment: quit 20 yrs ago     Allergies   Penicillins; Codeine; Iodine-131; Iohexol; Lisinopril; and Sulfa antibiotics   Review of Systems Review of Systems  All other systems reviewed and are negative.    Physical Exam Updated Vital Signs BP  127/70 (BP Location: Left Arm)   Pulse 65   Temp 98.2 F (36.8 C) (Oral)   Resp 16   Ht 4\' 11"  (1.499 m)   Wt 91.6 kg   SpO2 96%   BMI 40.80 kg/m   Physical Exam  Constitutional: She appears well-developed and well-nourished. No distress.  HENT:  Head: Normocephalic and atraumatic.  Neck: Normal range of motion. Neck supple.  Cardiovascular: Normal rate and regular rhythm.   Pulmonary/Chest: Effort normal. No respiratory distress. She has decreased breath sounds. She has no wheezes. She has no rhonchi. She has no rales.  Abdominal: Soft. She exhibits no distension. There is tenderness (suprapubic). There is no rebound and no guarding.  Musculoskeletal: She exhibits edema (pitting edema,  bilateral lower legs to level of knee).  Nodule over arch left foot, No erythema, edema, warmth, discharge, or tenderness   Neurological: She is alert.  Skin: She is not diaphoretic.  Nursing note and vitals reviewed.    ED Treatments / Results  Labs (all labs ordered are listed, but only abnormal results are displayed) Labs Reviewed  COMPREHENSIVE METABOLIC PANEL - Abnormal; Notable for the following:       Result Value   Glucose, Bld 200 (*)    BUN 30 (*)    Creatinine, Ser 1.04 (*)    Calcium 8.7 (*)    Albumin 3.1 (*)    GFR calc non Af Amer 58 (*)    All other components within normal limits  CBC WITH DIFFERENTIAL/PLATELET - Abnormal; Notable for the following:    Hemoglobin 11.7 (*)    HCT 33.9 (*)    MCV 76.0 (*)    All other components within normal limits  URINALYSIS, ROUTINE W REFLEX MICROSCOPIC - Abnormal; Notable for the following:    Color, Urine STRAW (*)    Hgb urine dipstick SMALL (*)    Bacteria, UA RARE (*)    Squamous Epithelial / LPF 0-5 (*)    All other components within normal limits  BRAIN NATRIURETIC PEPTIDE  TROPONIN I    EKG  EKG Interpretation None       Date: 07/03/2016  Rate: 67  Rhythm: normal sinus rhythm  QRS Axis: normal  Intervals:  normal  ST/T Wave abnormalities: normal  Conduction Disutrbances: none  Narrative Interpretation: wandering baseline  Old EKG Reviewed: No old for comparison.    Radiology Dg Chest 2 View  Result Date: 07/03/2016 CLINICAL DATA:  Shortness of breath.  Dyspnea on exertion EXAM: CHEST  2 VIEW COMPARISON:  01/30/2016 FINDINGS: Borderline heart size, stable. Negative aortic and hilar contours. There is no edema, consolidation, effusion, or pneumothorax. Diffuse spondylosis. Cholecystectomy clips. IMPRESSION: Stable exam.  No evidence of active disease. Electronically Signed   By: Monte Fantasia M.D.   On: 07/03/2016 13:14    Procedures Procedures (including critical care time)  Medications Ordered in ED Medications  furosemide (LASIX) injection 80 mg (80 mg Intravenous Given 07/03/16 1546)  acetaminophen (TYLENOL) tablet 1,000 mg (1,000 mg Oral Given 07/03/16 1701)     Initial Impression / Assessment and Plan / ED Course  I have reviewed the triage vital signs and the nursing notes.  Pertinent labs & imaging results that were available during my care of the patient were reviewed by me and considered in my medical decision making (see chart for details).     Afebrile, nontoxic patient with increased peripheral edema and SOB over the past 2-3 days, not strict with low salt diet, no compression stockings.  Felt much better with IV lasix, urinated >1000cc.  Workup remarkable for hyperglycemia, chronic renal insufficiency.  Doubt acute heart failure.  CXR clear.  Doubt DVT, no e/o lower extremity cellulitis.   Pt also seen by and plan discussed with Dr Venora Maples.  D/C home with recommendations for strict low salt diet, compression stockings, lower extremity elevation, increased lasix x 2 more days, close PCP follow up.   Discussed result, findings, treatment, and follow up  with patient.  Pt given return precautions.  Pt verbalizes understanding and agrees with plan.       Final Clinical  Impressions(s) / ED Diagnoses   Final diagnoses:  Peripheral edema    New Prescriptions Discharge Medication List as of 07/03/2016  5:48 PM       Clayton Bibles, PA-C 07/03/16 [place], MD 07/05/16 [phone]

## 2016-07-03 NOTE — ED Notes (Signed)
Patient asking for ice. Patient given ice chips.

## 2016-07-03 NOTE — ED Triage Notes (Signed)
Pt is legally blind. Pt c/o leg swelling and fluid retention. Pt also has small lump on bottom of left foot that is tender to touch. Pt adds she has been having SOB with exertion "for a good little while." Pt adds she is on oxygen PRN at night.

## 2016-07-03 NOTE — Discharge Instructions (Signed)
Read the information below.  You may return to the Emergency Department at any time for worsening condition or any new symptoms that concern you.     Please use compression stockings and elevate your feet to help the fluid decrease.  You may take an extra lasix pill for the next two days.  Please speak with your doctor this week about your fluid status and lasix dosage.  Follow a low salt diet closely, this includes no canned foods, no fast food.

## 2016-07-03 NOTE — ED Notes (Signed)
Patient asking for more fluids to drink to help with collecting urine sample.

## 2016-09-06 ENCOUNTER — Ambulatory Visit (INDEPENDENT_AMBULATORY_CARE_PROVIDER_SITE_OTHER): Payer: Medicare Other | Admitting: Podiatry

## 2016-09-06 DIAGNOSIS — M722 Plantar fascial fibromatosis: Secondary | ICD-10-CM

## 2016-09-06 DIAGNOSIS — E0842 Diabetes mellitus due to underlying condition with diabetic polyneuropathy: Secondary | ICD-10-CM | POA: Diagnosis not present

## 2016-09-06 DIAGNOSIS — B351 Tinea unguium: Secondary | ICD-10-CM

## 2016-09-06 DIAGNOSIS — M79676 Pain in unspecified toe(s): Secondary | ICD-10-CM | POA: Diagnosis not present

## 2016-09-06 NOTE — Patient Instructions (Signed)

## 2016-09-06 NOTE — Progress Notes (Signed)
Patient ID: Amy [name], female   DOB: 11/11/1960, 56 y.o.   MRN: [id number]   Subjective: This patient presents for scheduled visit complaining of uncomfortable toenails walking wearing shoes and requests toenail debridement. Patient has a history of diabetic peripheral neuropathy describes continuous ongoing burning and discomfort treated with Cymbalta. 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

## 2016-09-10 IMAGING — CR DG CHEST 2V
2 series · 2 of 2 positions shown · non-contrast
Comparison: None.

CLINICAL DATA: Cough and congestion .

EXAM:
CHEST  2 VIEW

[chest lat]
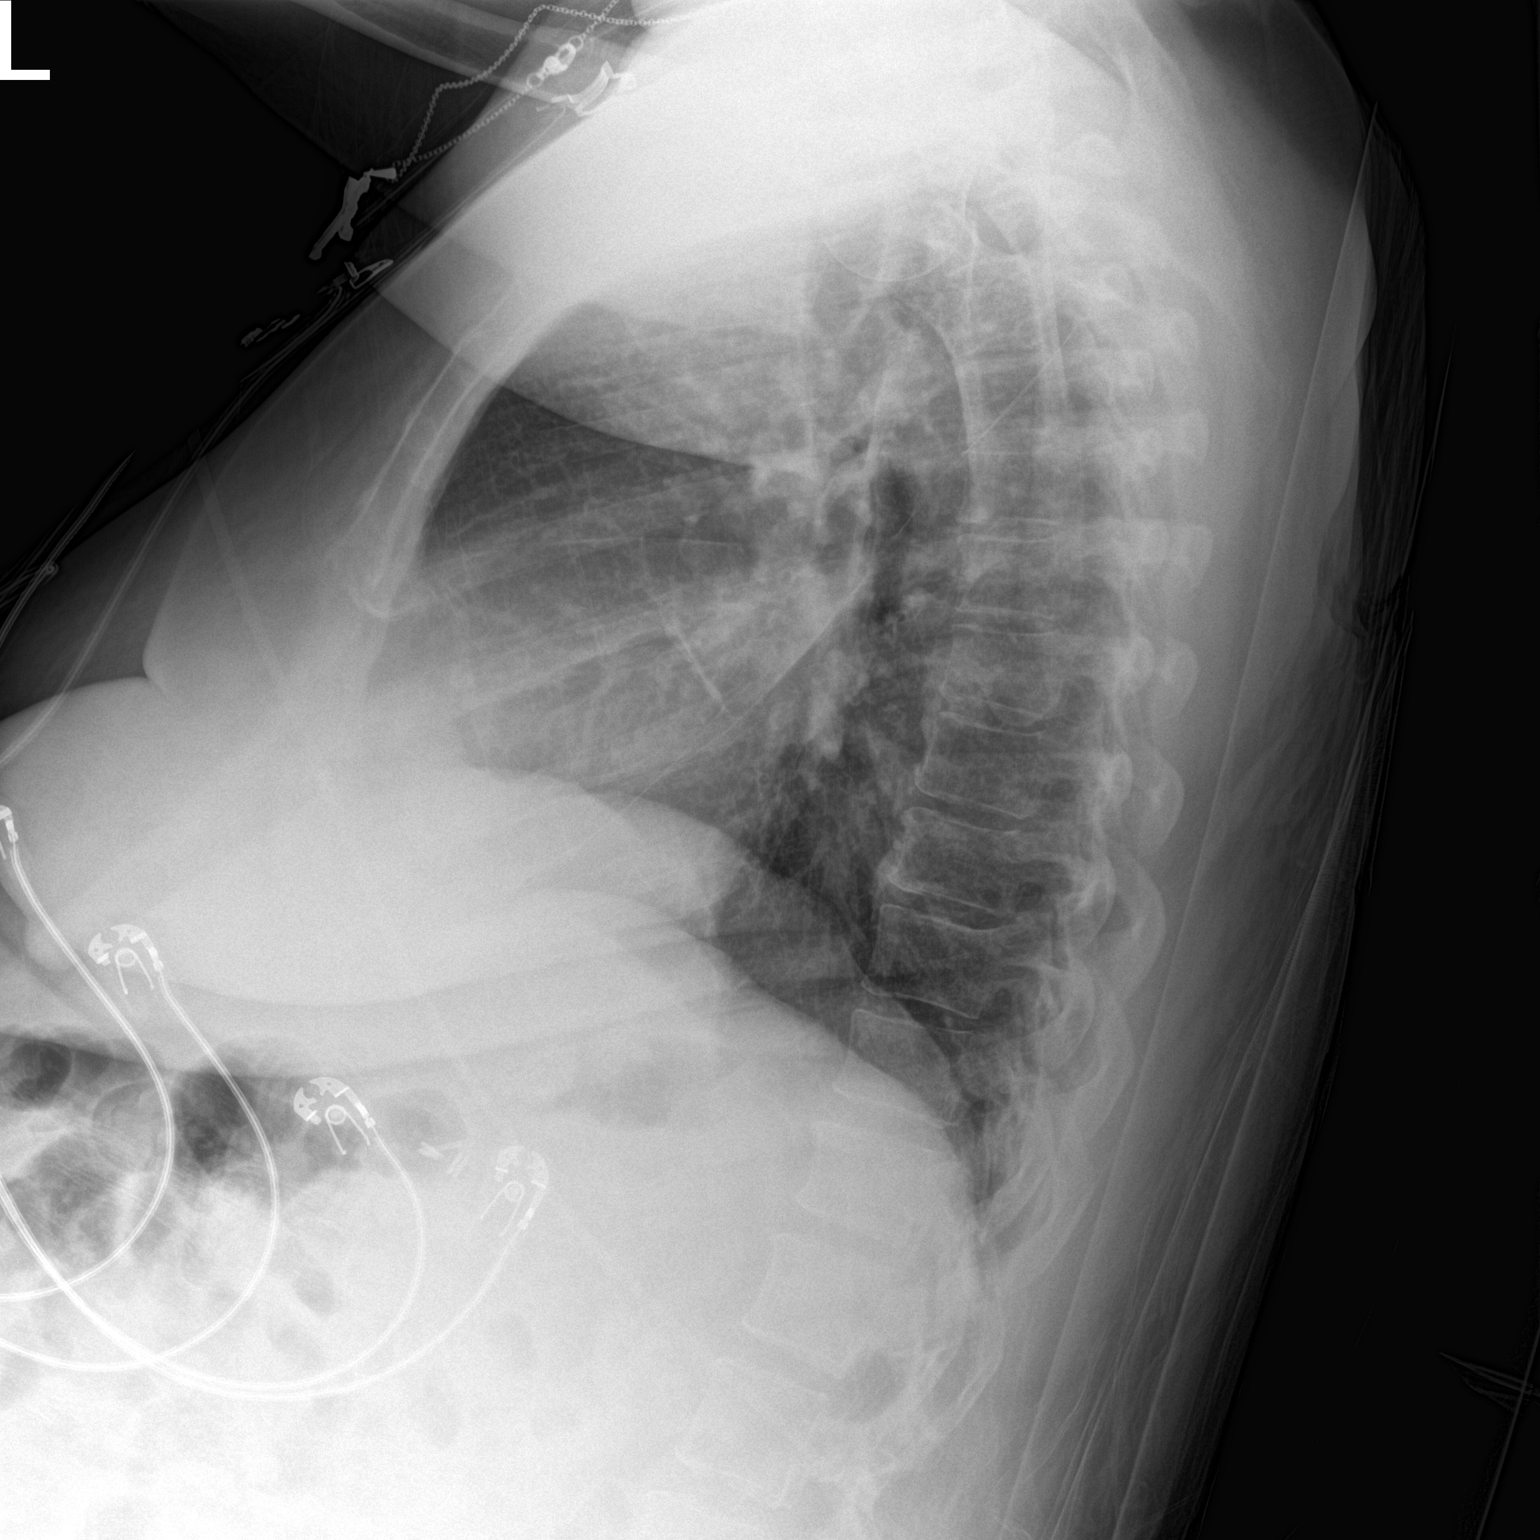

[chest ap]
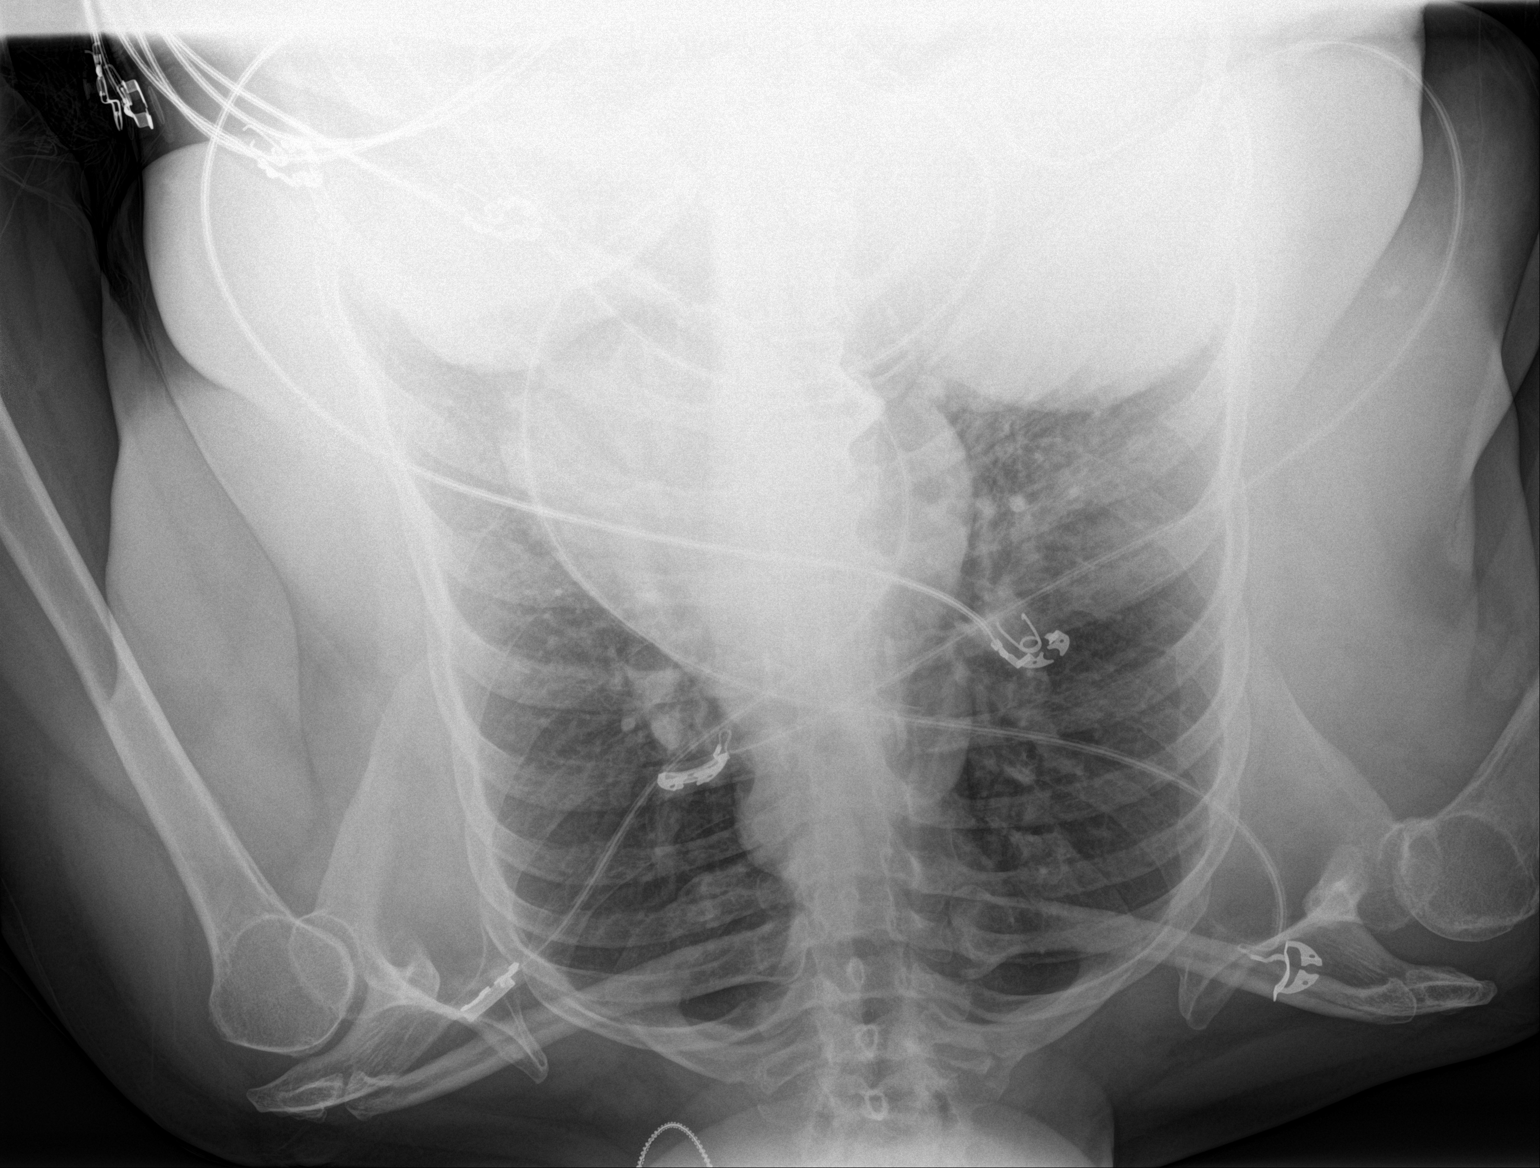

[2 of 2 positions shown; findings below may reference images not displayed]

FINDINGS: Mediastinum and hilar structures normal. Low lung volumes with mild
basilar atelectasis and/or infiltrate. No pleural effusion or
pneumothorax. Heart size normal.
IMPRESSION: Mild bibasilar atelectasis and/or infiltrates.

## 2016-09-22 ENCOUNTER — Ambulatory Visit (INDEPENDENT_AMBULATORY_CARE_PROVIDER_SITE_OTHER): Payer: Medicare Other | Admitting: Neurology

## 2016-09-22 ENCOUNTER — Encounter: Payer: Self-pay | Admitting: Neurology

## 2016-09-22 VITALS — BP 108/62 | HR 68 | Resp 20 | Ht 59.0 in | Wt 200.0 lb

## 2016-09-22 DIAGNOSIS — Z6841 Body Mass Index (BMI) 40.0 and over, adult: Secondary | ICD-10-CM | POA: Diagnosis not present

## 2016-09-22 DIAGNOSIS — R400 Somnolence: Secondary | ICD-10-CM | POA: Diagnosis not present

## 2016-09-22 DIAGNOSIS — R600 Localized edema: Secondary | ICD-10-CM

## 2016-09-22 DIAGNOSIS — R0681 Apnea, not elsewhere classified: Secondary | ICD-10-CM | POA: Diagnosis not present

## 2016-09-22 DIAGNOSIS — R51 Headache: Secondary | ICD-10-CM | POA: Diagnosis not present

## 2016-09-22 DIAGNOSIS — R351 Nocturia: Secondary | ICD-10-CM | POA: Diagnosis not present

## 2016-09-22 DIAGNOSIS — R0683 Snoring: Secondary | ICD-10-CM

## 2016-09-22 DIAGNOSIS — R519 Headache, unspecified: Secondary | ICD-10-CM

## 2016-09-22 NOTE — Patient Instructions (Signed)

## 2016-09-22 NOTE — Progress Notes (Signed)
Subjective:    Patient ID: [name] is a 56 y.o. female.  HPI     Amy Age, MD, PhD [facility] [address] [place], [place] [address]  Dear Dr. Ardeth Perfect,   I saw your patient, Amy [name], upon your kind request, for initial consultation of her sleep disorder, in particular, concern for underlying obstructive sleep apnea. The patient is accompanied by her sister, Amy [name], today. As you know, Amy [name] is a 56 year old right-handed woman with an underlying medical history of diabetes, blindness (with L prosthetic eye and R eye blindness d/t diabetes), hyperlipidemia, asthma, arthritis, spinal stenosis, thyroid disease, nephropathy, orthostatic hypotension deemed secondary to diabetic autonomic neuropathy (for which she saw Dr. Leta [name] in 9/17), and morbid obesity, who reports snoring and excessive daytime somnolence as well as breathing pauses while asleep. Her Epworth sleepiness score is 14 out of 24, fatigue score is 60 out of 63. She is single, she does not work. She lives alone. She has 2 grown children. She quit smoking in 1996, and does not currently drink alcohol. She drinks caffeine in the form of coffee, usually 2 cups in the mornings. She does not drink sodas or tea on a daily basis.  She has a family history of obstructive sleep apnea in her sister, maternal aunt, and one cousin. The patient has been witnessed to have breathing pauses while asleep by her aide. She has an aide typically daily from 8 to 2 PM. She has nocturia about twice per average night and has had occasional morning headaches. Bedtime is around 10 PM and wakeup time around 8 AM. She has had lower extremity swelling. She tries to elevate her legs and sometimes uses compression socks. She has tingling in her legs. She takes Lyrica. I reviewed your office note from 08/01/2016, which you kindly included.  Her Past Medical History Is Significant For: Past Medical  History:  Diagnosis Date  . Asthma   . Blind   . Blindness and low vision    left eye glass eye,  legally blind in right eye  . Diabetes mellitus   . Diabetic neuropathy ([place])   . Hyperlipidemia   . Hypertension   . Hypothyroidism   . Spinal stenosis     Her Past Surgical History Is Significant For: Past Surgical History:  Procedure Laterality Date  . ABDOMINAL HYSTERECTOMY  1990  . [place]   x 2  . CHOLECYSTECTOMY  2008  . ENUCLEATION Bilateral 09/15/1998  . EYE SURGERY  2016   fitting artificial eye    Her Family History Is Significant For: Family History  Problem Relation [name] of Onset  . Diabetes Sister   . Glaucoma Sister   . Hypertension Sister   . Stroke Brother   . Heart attack Paternal Aunt     Her Social History Is Significant For: Social History   Social History  . Marital status: Single    Spouse name: N/A  . Number of children: 2  . Years of education: 12   Occupational History  . N/A     disabled   Social History Main Topics  . Smoking status: Former Smoker    Types: Cigarettes    Quit date: 05/22/1992  . Smokeless tobacco: Never Used  . Alcohol use No     Comment: quit 20 yrs ago  . Drug use: No  . Sexual activity: Not Currently   Other Topics Concern  . None  Social History Narrative   Lives alone   caffeine drinks 2 cups of coffee a day, occasional soda     Her Allergies Are:  Allergies  Allergen Reactions  . Penicillins Hives and Swelling    Has patient had a PCN reaction causing immediate rash, facial/tongue/throat swelling, SOB or lightheadedness with hypotension: yes- face swelling Has patient had a PCN reaction causing severe rash involving mucus membranes or skin necrosis: no Has patient had a PCN reaction that required hospitalization unknown (childhood allergy) Has patient had a PCN reaction occurring within the last 10 years: no If all of the above answers are "NO", then may proceed with  Cephalosporin use.   . Codeine Nausea Only  . Iodine-131 Hives    hives  . Iohexol Hives     Code: HIVES, Desc: pt developed itching and hives along with nasal congestion; needs 13 hour premeds for future studies, Onset Date: [id number]   . Lisinopril Cough  . Sulfa Antibiotics Nausea And Vomiting  :   Her Current Medications Are:  Outpatient Encounter Prescriptions as of 09/22/2016  Medication Sig  . acetaminophen (TYLENOL) 325 MG tablet Take 2 tablets (650 mg total) by mouth every 6 (six) hours as needed for mild pain (or Fever >/= 101).  Marland Kitchen albuterol (PROVENTIL HFA;VENTOLIN HFA) 108 (90 BASE) MCG/ACT inhaler Inhale 2 puffs into the lungs every 4 (four) hours as needed for wheezing or shortness of breath. For short  . aspirin EC 81 MG tablet Take 81 mg by mouth daily.  . clobetasol ointment (TEMOVATE) 8.14 % Apply 1 application topically 2 (two) times daily as needed (dermatosis).   . Dulaglutide (TRULICITY) 4.81 EH/6.3JS SOPN Inject 0.5 mLs into the skin once a week.   . esomeprazole (NEXIUM) 40 MG capsule Take 40 mg by mouth 2 (two) times daily before a meal.   . fluorometholone (FML FORTE) 0.25 % ophthalmic suspension Place 1 drop into the right eye 2 (two) times daily.  . furosemide (LASIX) 40 MG tablet   . HUMALOG KWIKPEN 100 UNIT/ML KiwkPen Inject 8-14 Units into the skin 3 (three) times daily. 8 units with breakfast 10 units with lunch and 14 units with dinner  . LEVEMIR FLEXTOUCH 100 UNIT/ML Pen Inject 45 Units into the skin every evening.  Marland Kitchen levothyroxine (SYNTHROID, LEVOTHROID) 150 MCG tablet Take 150 mcg by mouth daily.  . Linaclotide (LINZESS) 145 MCG CAPS capsule Take 145 mcg by mouth daily as needed (constipation).   Marland Kitchen LYRICA 150 MG capsule Take 150 mg by mouth 3 (three) times daily.  . montelukast (SINGULAIR) 10 MG tablet Take 10 mg by mouth at bedtime.  Marland Kitchen neomycin-polymyxin b-dexamethasone (MAXITROL) 3.5-10000-0.1 OINT Use 1/4" ribbon to affected eye once a day as needed  for antibiotic. If you develop allergy symptoms, stop & call our office.  . simvastatin (ZOCOR) 20 MG tablet Take 20 mg by mouth at bedtime.   . [DISCONTINUED] midodrine (PROAMATINE) 5 MG tablet Take 5 mg by mouth 3 (three) times daily.  . [DISCONTINUED] promethazine (PHENERGAN) 12.5 MG tablet Take 1 tablet (12.5 mg total) by mouth every 8 (eight) hours as needed for nausea or vomiting.   No facility-administered encounter medications on file as of 09/22/2016.   :  Review of Systems:  Out of a complete 14 point review of systems, all are reviewed and negative with the exception of these symptoms as listed below: Review of Systems  Neurological:       Patient has trouble falling asleep, reports that  she tosses and turns all night, snores, witnessed apnea, wakes up feeling tired, daytime fatigue, morning headaches, takes naps.    Epworth Sleepiness Scale 0= would never doze 1= slight chance of dozing 2= moderate chance of dozing 3= high chance of dozing  Sitting and reading:2 Watching TV:2 Sitting inactive in a public place (ex. Theater or meeting):1 As a passenger in a car for an hour without a break:2 Lying down to rest in the afternoon:3 Sitting and talking to someone:1 Sitting quietly after lunch (no alcohol):3 In a car, while stopped in traffic:0 Total:14  Objective:  Neurologic Exam  Physical Exam Physical Examination:   Vitals:   09/22/16 0900  BP: 108/62  Pulse: 68  Resp: 20   General Examination: The patient is a very pleasant 56 y.o. female in no acute distress. She appears well-developed and well-nourished and well groomed.   HEENT: Normocephalic, atraumatic, R pupil round, not reactive to light, s/p cataract removal. L eye prosthesis. Extraocular tracking is not possible to be tested. Hearing is grossly intact. Face is symmetric with normal facial animation and normal facial sensation. Speech is clear with no dysarthria noted. There is no hypophonia. There is no  lip, neck/head, jaw or voice tremor. Neck is supple with full range of passive and active motion. There are no carotid bruits on auscultation. Oropharynx exam reveals: mild mouth dryness, adequate dental hygiene with dentures on top, edentulous on bottom, and marked airway crowding, due to smaller airway, large tongue, tonsils of 2-3+ and larger uvula. Mallampati is class III. Tongue protrudes centrally and palate elevates symmetrically. Neck size is 16.25 inches.   Chest: Clear to auscultation without wheezing, rhonchi or crackles noted.  Heart: S1+S2+0, regular and normal without murmurs, rubs or gallops noted.   Abdomen: Soft, non-tender and non-distended with normal bowel sounds appreciated on auscultation.  Extremities: There is 2+ pitting edema in the distal lower extremities bilaterally. Pedal pulses are intact.  Skin: Warm and dry without trophic changes noted. There are no varicose veins.  Musculoskeletal: exam reveals no obvious joint deformities, tenderness or joint swelling or erythema.   Neurologically:  Mental status: The patient is awake, alert and oriented in all 4 spheres. Her immediate and remote memory, attention, language skills and fund of knowledge are appropriate. There is no evidence of aphasia, agnosia, apraxia or anomia. Speech is clear with normal prosody and enunciation. Thought process is linear. Mood is normal and affect is normal.  Cranial nerves II - XII are as described above under HEENT exam. In addition: shoulder shrug is normal with equal shoulder height noted. Motor exam: Normal bulk, strength and tone is noted. There is no drift, tremor or rebound. Romberg is not tested for safety. Reflexes are 1+ in the UEs and absent in the LEs. Fine motor skills and coordination. Globally mildly impaired..  Cerebellar testing: No dysmetria or intention tremor on finger to nose testing. Heel to shin is unremarkable bilaterally. There is no truncal or gait ataxia.  Sensory  exam: intact to light touch, pinprick, vibration, temperature sense in the upper extremities, some decrease in the LEs and sensitive to PP sensation.  Gait, station and balance: She stands with difficulty. No veering to one side is noted. Using blind stick and walks holding onto sister.   Assessment and Plan:  In summary, NABEEHA BADERTSCHER is a very pleasant 56 y.o.-year old female with an underlying medical history of diabetes, blindness (with L prosthetic eye and R eye blindness d/t diabetes), hyperlipidemia, asthma,  arthritis, spinal stenosis, thyroid disease, nephropathy, orthostatic hypotension deemed secondary to diabetic autonomic neuropathy (for which she saw Dr. Leta [name] in 9/17), and morbid obesity, whose history and physical exam are in keeping with obstructive sleep apnea (OSA). I had a long chat with the patient and Amy [name] about my findings and the diagnosis of OSA, its prognosis and treatment options. We talked about medical treatments, surgical interventions and non-pharmacological approaches. I explained in particular the risks and ramifications of untreated moderate to severe OSA, especially with respect to developing cardiovascular disease down the Road, including congestive heart failure, difficult to treat hypertension, cardiac arrhythmias, or stroke. Even type 2 diabetes has, in part, been linked to untreated OSA. Symptoms of untreated OSA include daytime sleepiness, memory problems, mood irritability and mood disorder such as depression and anxiety, lack of energy, as well as recurrent headaches, especially morning headaches. We talked about trying to maintain a healthy lifestyle in general, as well as the importance of weight control. I encouraged the patient to eat healthy, exercise daily and keep well hydrated, to keep a scheduled bedtime and wake time routine, to not skip any meals and eat healthy snacks in between meals.  I recommended the following at this time: sleep study with  potential positive airway pressure titration. (We will score hypopneas at 4%).   I explained the sleep test procedure to the patient and also outlined possible surgical and non-surgical treatment options of OSA, including the use of a custom-made dental device (which would require a referral to a specialist dentist or oral surgeon), upper airway surgical options, such as pillar implants, radiofrequency surgery, tongue base surgery, and UPPP (which would involve a referral to an ENT surgeon). Rarely, jaw surgery such as mandibular advancement may be considered.  I also explained the CPAP treatment option to the patient, who indicated that she would be willing to try CPAP if the need arises. I explained the importance of being compliant with PAP treatment, not only for insurance purposes but primarily to improve Her symptoms, and for the patient's long term health benefit, including to reduce Her cardiovascular risks. I answered all her questions today and the patient and her sister were in agreement. I would like to see her back after the sleep study is completed and encouraged her to call with any interim questions, concerns, problems or updates.   Thank you very much for allowing me to participate in the care of this nice patient. If I can be of any further assistance to you please do not hesitate to call me at [phone].  Sincerely,   Amy Age, MD, PhD

## 2016-10-02 ENCOUNTER — Ambulatory Visit (INDEPENDENT_AMBULATORY_CARE_PROVIDER_SITE_OTHER): Payer: Medicare Other | Admitting: Neurology

## 2016-10-02 DIAGNOSIS — G4733 Obstructive sleep apnea (adult) (pediatric): Secondary | ICD-10-CM | POA: Diagnosis not present

## 2016-10-02 DIAGNOSIS — G4720 Circadian rhythm sleep disorder, unspecified type: Secondary | ICD-10-CM

## 2016-10-07 NOTE — Addendum Note (Signed)
Addended by: [name] on: 10/07/2016 11:42 AM   Modules accepted: Orders

## 2016-10-07 NOTE — Progress Notes (Signed)
Patient referred by Dr. Ardeth Perfect, seen by me on 09/22/16, diagnostic PSG on 10/02/16.    Please call and notify the patient that the recent sleep study did confirm the diagnosis of obstructive sleep apnea with moderate findings in REM sleep and O2 nadir of 78%, and that I recommend treatment for this in the form of CPAP. This will require a repeat sleep study for proper titration and mask fitting. Please explain to patient and arrange for a CPAP titration study. I have placed an order in the chart. Thanks, and please route to [facility] for scheduling next sleep study.  [name], MD, PhD [facility])

## 2016-10-07 NOTE — Procedures (Signed)
PATIENT'S NAME:  Amy, [name] DOB:      11/21/1960      MR#:    [id number]     DATE OF RECORDING: 10/02/2016 REFERRING M.D.:  Velna Hatchet, MD Study Performed:   Baseline Polysomnogram HISTORY: 56 year old woman with a history of diabetes, blindness (with L prosthetic eye and R eye blindness d/t diabetes), hyperlipidemia, asthma, arthritis, spinal stenosis, thyroid disease, nephropathy, orthostatic hypotension deemed secondary to diabetic autonomic neuropathy, and morbid obesity, who reports snoring and excessive daytime somnolence as well as breathing pauses while asleep. Her Epworth sleepiness score is 14 out of 24, fatigue score is 60 out of 63. The patient's weight 200 pounds with a height of 59 (inches), resulting in a BMI of 40.4 kg/m2. The patient's neck circumference measured 16.2 inches.  CURRENT MEDICATIONS: Acetaminophen, Albuterol, Aspirin, Clobetasol, Dulaglutide, ?Esomeprazole, Fluorometholone, Furosemide, Humalog kwickpen, Levothyroxine, Linaclotide, Syrica, Montelukast, Neomycin-Polymyxin and Simvastatin   PROCEDURE:  This is a multichannel digital polysomnogram utilizing the Somnostar 11.2 system.  Electrodes and sensors were applied and monitored per AASM Specifications.   EEG, EOG, Chin and Limb EMG, were sampled at 200 Hz.  ECG, Snore and Nasal Pressure, Thermal Airflow, Respiratory Effort, CPAP Flow and Pressure, Oximetry was sampled at 50 Hz. Digital video and audio were recorded.      BASELINE STUDY  Lights Out was at 21:00 and Lights On at 05:01.  Total recording time (TRT) was 481.5 minutes, with a total sleep time (TST) of 408.5 minutes.   The patient's sleep latency was 18.5 minutes, which is normal.  REM latency was 72.5 minutes, which is normal.  The sleep efficiency was 84.8 %.     SLEEP ARCHITECTURE: WASO (Wake after sleep onset) was 54.5 minutes with mild sleep fragmentation noted.  There were 17 minutes in Stage N1, 323 minutes Stage N2, 0 minutes Stage N3 and 68.5  minutes in Stage REM.  The percentage of Stage N1 was 4.2%, Stage N2 was 79.1%, which is markedly increased, Stage N3 was absent, and Stage R (REM sleep) was 16.8%, which is mildly reduced.  The arousals were noted as: 37 were spontaneous, 0 were associated with PLMs, 46 were associated with respiratory events.    Audio and video analysis did not show any abnormal or unusual movements, behaviors, phonations or vocalizations. The patient took 2 bathroom breaks. Mild snoring was noted. The EKG was in keeping with normal sinus rhythm (NSR).  RESPIRATORY ANALYSIS:  There were a total of 46 respiratory events:  12 obstructive apneas, 0 central apneas and 0 mixed apneas with a total of 12 apneas and an apnea index (AI) of 1.8 /hour. There were 34 hypopneas with a hypopnea index of 5. /hour. The patient also had 0 respiratory event related arousals (RERAs).      The total APNEA/HYPOPNEA INDEX (AHI) was 6.8/hour and the total RESPIRATORY DISTURBANCE INDEX was 6.8 /hour.  32 events occurred in REM sleep and 20 events in NREM. The REM AHI was 28. /hour, versus a non-REM AHI of 2.5. The patient spent 6 minutes of total sleep time in the supine position and 403 minutes in non-supine.. The supine AHI was 0.0 versus a non-supine AHI of 6.9.  OXYGEN SATURATION & C02:  The Wake baseline 02 saturation was 95%, with the lowest being 78%. Time spent below 89% saturation equaled 24 minutes.  PERIODIC LIMB MOVEMENTS: The patient had a total of 0 Periodic Limb Movements.  The Periodic Limb Movement (PLM) index was 0 and the PLM  Arousal index was 0/hour. Post-study, the patient indicated that sleep was the same as usual.   IMPRESSION:  1. Obstructive Sleep Apnea (OSA) 2. Dysfunctions associated with sleep stages or arousal from sleep  RECOMMENDATIONS:  1. This study demonstrates overall mild obstructive sleep apnea, moderate in REM sleep with a total AHI of 6.8/hour, REM AHI of 28/hour, and O2 nadir of 78%. Given the  patient's medical history, REM related desaturations and her sleep related complaints, a full-night CPAP titration study is recommended to optimize therapy for her OSA. Other treatment options may include avoidance of supine sleep position along with weight loss, upper airway or jaw surgery in selected patients or the use of an oral appliance in certain patients. ENT evaluation and/or consultation with a maxillofacial surgeon or dentist may be feasible in some instances.    2. Please note that untreated obstructive sleep apnea carries additional perioperative morbidity. Patients with significant obstructive sleep apnea should receive perioperative PAP therapy and the surgeons and particularly the anesthesiologist should be informed of the diagnosis and the severity of the sleep disordered breathing. 3. This study shows sleep fragmentation and abnormal sleep stage percentages; these are nonspecific findings and per se do not signify an intrinsic sleep disorder or a cause for the patient's sleep-related symptoms. Causes include (but are not limited to) the first night effect of the sleep study, circadian rhythm disturbances, medication effect or an underlying mood disorder or medical problem.  4. The patient should be cautioned not to drive, work at heights, or operate dangerous or heavy equipment when tired or sleepy. Review and reiteration of good sleep hygiene measures should be pursued with any patient. 5. The patient will be seen in follow-up by Dr. Rexene Alberts at [facility] for discussion of the test results and further management strategies. The referring provider will be notified of the test results.  I certify that I have reviewed the entire raw data recording prior to the issuance of this report in accordance with the Standards of Accreditation of the American Academy of Sleep Medicine (AASM)  [name], MD, PhD Diplomat, American Board of Psychiatry and Neurology (Neurology and Sleep Medicine)

## 2016-10-11 ENCOUNTER — Telehealth: Payer: Self-pay

## 2016-10-11 NOTE — Telephone Encounter (Signed)
No answer or vm on cell. LM at home number for patient to call back.

## 2016-10-11 NOTE — Telephone Encounter (Signed)
-----   Message from [name], MD sent at 10/07/2016 11:42 AM EDT ----- Patient referred by Dr. Ardeth Perfect, seen by me on 09/22/16, diagnostic PSG on 10/02/16.    Please call and notify the patient that the recent sleep study did confirm the diagnosis of obstructive sleep apnea with moderate findings in REM sleep and O2 nadir of 78%, and that I recommend treatment for this in the form of CPAP. This will require a repeat sleep study for proper titration and mask fitting. Please explain to patient and arrange for a CPAP titration study. I have placed an order in the chart. Thanks, and please route to [facility] for scheduling next sleep study.  [name], MD, PhD [facility])

## 2016-10-12 NOTE — Telephone Encounter (Signed)
Patient called office returning RN's call.  Please call °

## 2016-10-12 NOTE — Telephone Encounter (Signed)
I spoke to patient and she is aware of results and recommendations. She is willing to proceed with titration study. I will send copy of report to patient (per patient request) and I sent a copy to PCP.

## 2016-10-17 ENCOUNTER — Emergency Department (HOSPITAL_COMMUNITY): Payer: Medicare Other

## 2016-10-17 ENCOUNTER — Emergency Department (HOSPITAL_COMMUNITY)
Admission: EM | Admit: 2016-10-17 | Discharge: 2016-10-18 | Disposition: A | Discharge status: Home / Self Care | Payer: Medicare Other | Attending: Emergency Medicine | Admitting: Emergency Medicine

## 2016-10-17 ENCOUNTER — Encounter (HOSPITAL_COMMUNITY): Payer: Self-pay

## 2016-10-17 DIAGNOSIS — Z794 Long term (current) use of insulin: Secondary | ICD-10-CM | POA: Insufficient documentation

## 2016-10-17 DIAGNOSIS — E1140 Type 2 diabetes mellitus with diabetic neuropathy, unspecified: Secondary | ICD-10-CM | POA: Diagnosis not present

## 2016-10-17 DIAGNOSIS — Z7982 Long term (current) use of aspirin: Secondary | ICD-10-CM | POA: Insufficient documentation

## 2016-10-17 DIAGNOSIS — E1121 Type 2 diabetes mellitus with diabetic nephropathy: Secondary | ICD-10-CM | POA: Diagnosis not present

## 2016-10-17 DIAGNOSIS — Z87891 Personal history of nicotine dependence: Secondary | ICD-10-CM | POA: Diagnosis not present

## 2016-10-17 DIAGNOSIS — J45909 Unspecified asthma, uncomplicated: Secondary | ICD-10-CM | POA: Diagnosis not present

## 2016-10-17 DIAGNOSIS — E039 Hypothyroidism, unspecified: Secondary | ICD-10-CM | POA: Insufficient documentation

## 2016-10-17 DIAGNOSIS — R600 Localized edema: Secondary | ICD-10-CM | POA: Diagnosis not present

## 2016-10-17 DIAGNOSIS — Z79899 Other long term (current) drug therapy: Secondary | ICD-10-CM | POA: Insufficient documentation

## 2016-10-17 DIAGNOSIS — M7989 Other specified soft tissue disorders: Secondary | ICD-10-CM | POA: Diagnosis present

## 2016-10-17 DIAGNOSIS — R609 Edema, unspecified: Secondary | ICD-10-CM

## 2016-10-17 LAB — BASIC METABOLIC PANEL
ANION GAP: 9 (ref 5–15)
BUN: 21 mg/dL — AB (ref 6–20)
CHLORIDE: 110 mmol/L (ref 101–111)
CO2: 22 mmol/L (ref 22–32)
Calcium: 8.9 mg/dL (ref 8.9–10.3)
Creatinine, Ser: 1.12 mg/dL — ABNORMAL HIGH (ref 0.44–1.00)
GFR calc Af Amer: >60 mL/min (ref >60)
GFR calc non Af Amer: 53 mL/min — ABNORMAL LOW (ref >60)
GLUCOSE: 121 mg/dL — AB (ref 65–99)
Potassium: 3.7 mmol/L (ref 3.5–5.1)
Sodium: 141 mmol/L (ref 135–145)

## 2016-10-17 LAB — CBC
HEMATOCRIT: 34.2 % — AB (ref 36.0–46.0)
HEMOGLOBIN: 11.4 g/dL — AB (ref 12.0–15.0)
MCH: 25.6 pg — AB (ref 26.0–34.0)
MCHC: 33.3 g/dL (ref 30.0–36.0)
MCV: 76.7 fL — AB (ref 78.0–100.0)
Platelets: 237 10*3/uL (ref 150–400)
RBC: 4.46 MIL/uL (ref 3.87–5.11)
RDW: 15.2 % (ref 11.5–15.5)
WBC: 9 10*3/uL (ref 4.0–10.5)

## 2016-10-17 LAB — I-STAT TROPONIN, ED: Troponin i, poc: 0 ng/mL (ref 0.00–0.08)

## 2016-10-17 IMAGING — CR DG CHEST 2V
2 series · 2 of 2 positions shown · non-contrast
Comparison: August 08, 2014

CLINICAL DATA: Nausea and weakness ; hypertension

EXAM:
CHEST  2 VIEW

[chest lat]
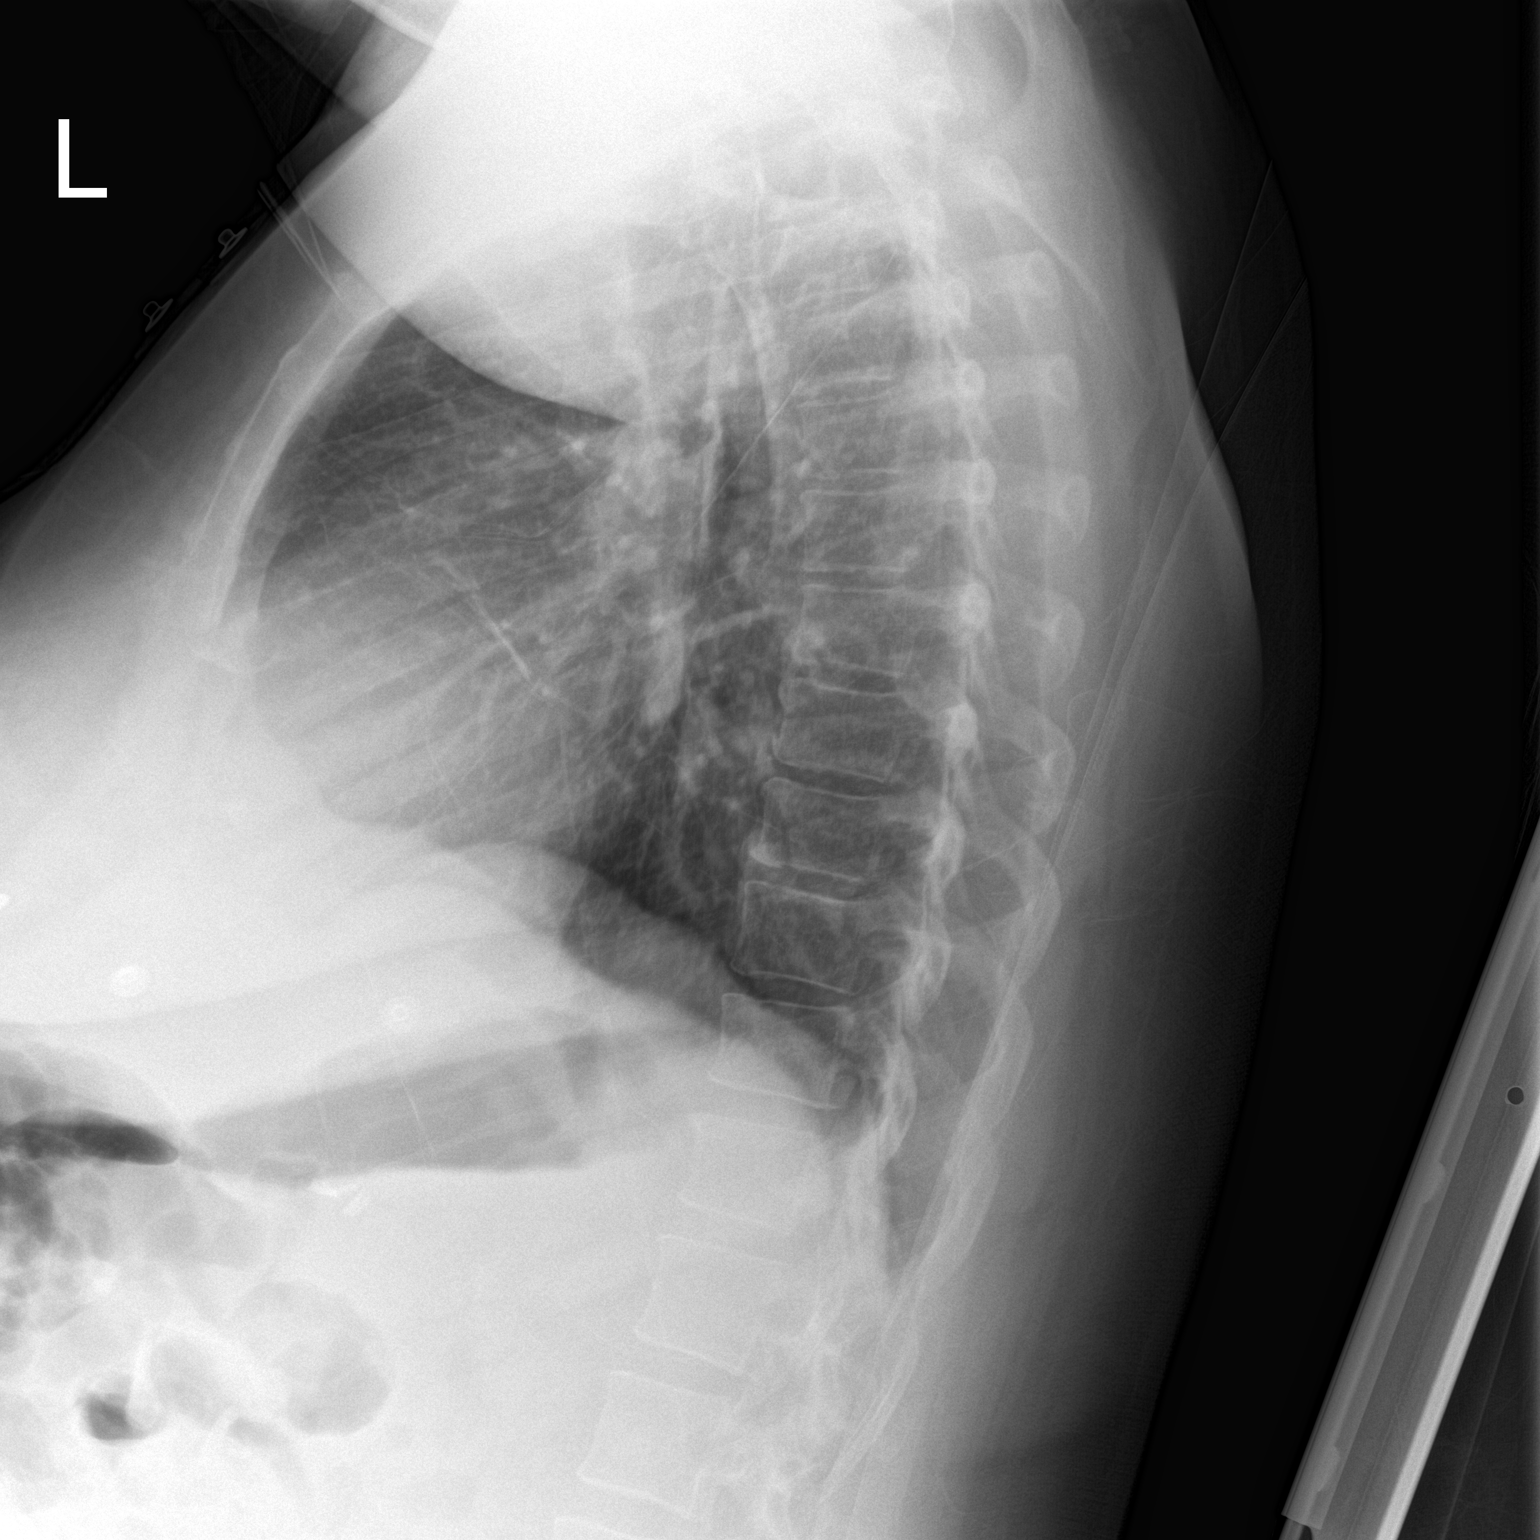

[chest ap]
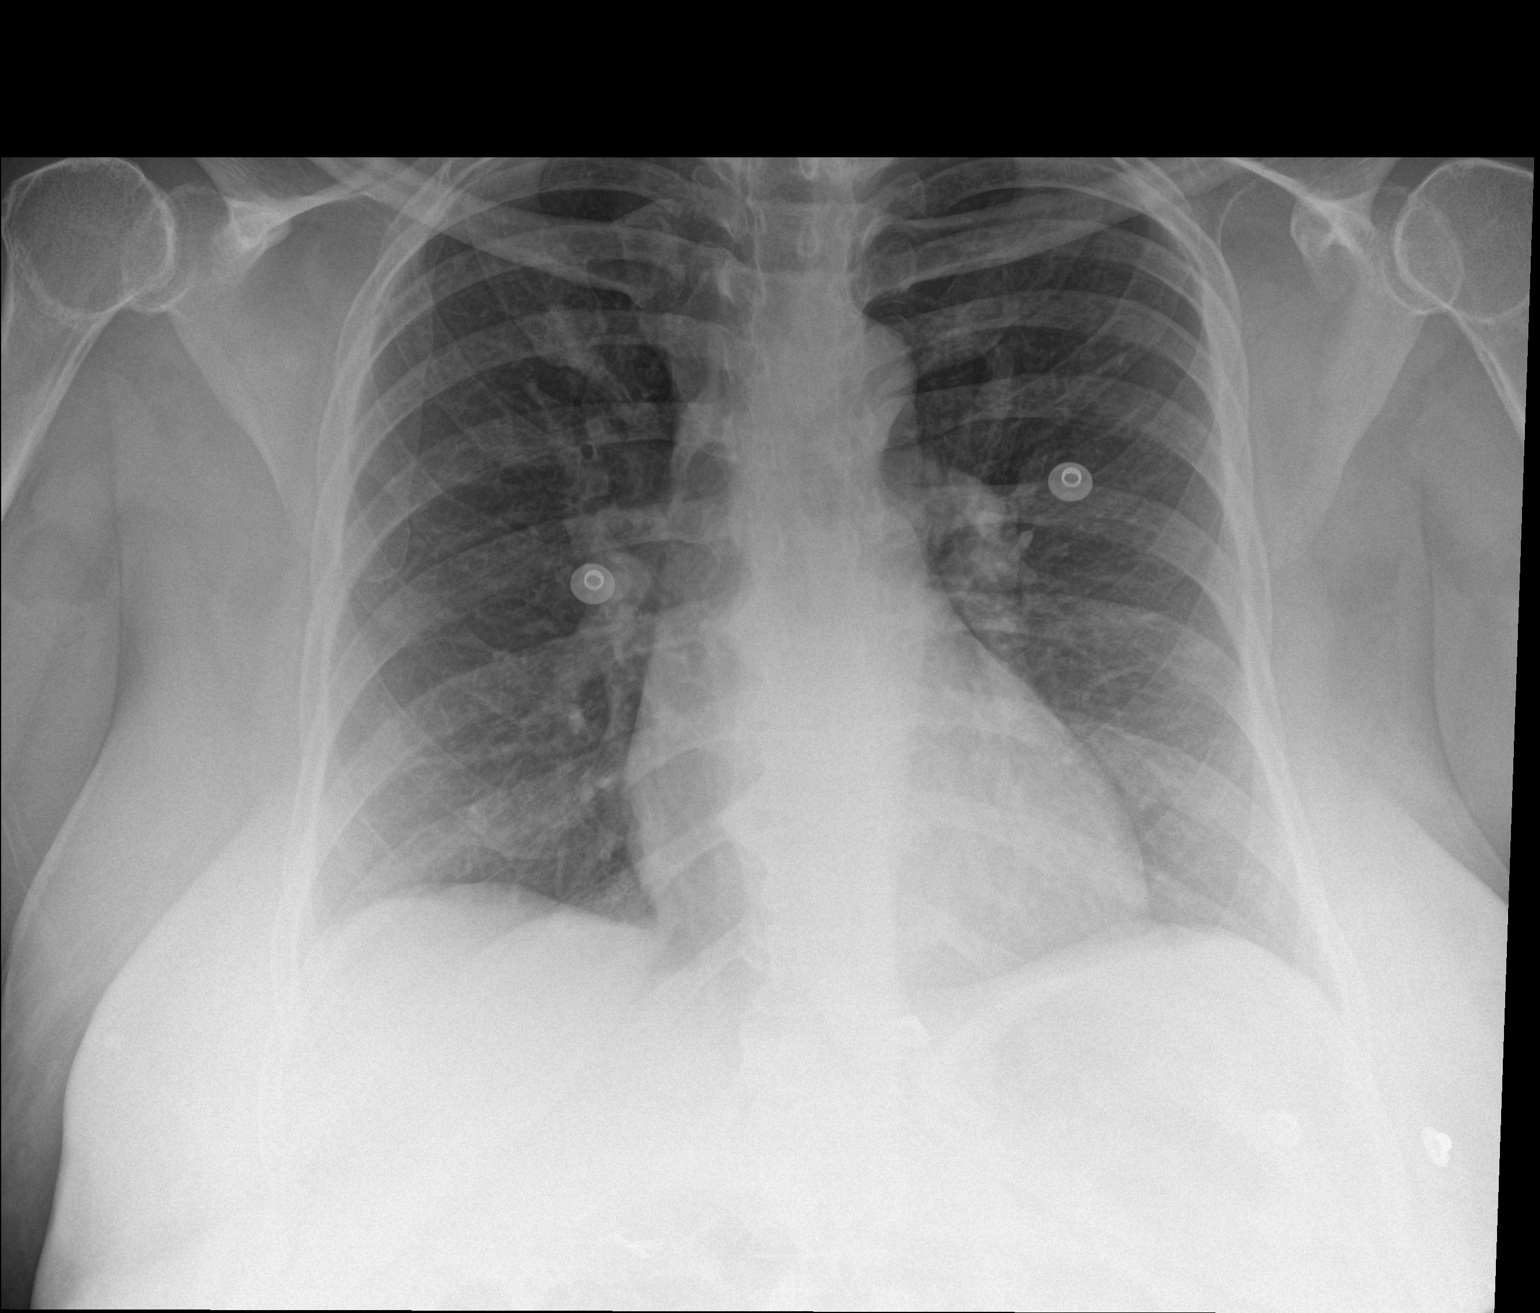

[2 of 2 positions shown; findings below may reference images not displayed]

FINDINGS: Lungs are clear. Heart size and pulmonary vascularity are normal. No
adenopathy. There is mild degenerative change in the thoracic spine.
IMPRESSION: No edema or consolidation.

## 2016-10-17 MED ORDER — FUROSEMIDE 10 MG/ML IJ SOLN
80.0000 mg | Freq: Once | INTRAMUSCULAR | Status: AC
  Administered 2016-10-17: 80 mg via INTRAVENOUS
  Filled 2016-10-17: qty 8

## 2016-10-17 NOTE — ED Triage Notes (Signed)
Pt states bilateral swelling of the legs. Pitting edema noted. She also reports a knot that is sore in the arch of her left foot. No redness or swelling noted at this location. Pt also reports some shortness of breath. Resp e/u.

## 2016-10-17 NOTE — ED Notes (Signed)
Amy Holiday, MD at bedside

## 2016-10-17 NOTE — ED Provider Notes (Signed)
[facility] DEPT Provider Note   CSN: [id number] Arrival date & time: 10/17/16  1748  By signing my name below, I, Amy [name], attest that this documentation has been prepared under the direction and in the presence of Pollina, Gwenyth Allegra, MD. Electronically Signed: Theresia [name], ED Scribe. 10/17/16. 11:21 PM.  History   Chief Complaint Chief Complaint  Patient presents with  . Leg Swelling   The history is provided by the patient. No language interpreter was used.   HPI Comments: FEMALE IAFRATE is a 56 y.o. female with a PMHx of CHF and DM, who presents to the Emergency Department complaining of moderate, constant bilateral leg swelling onset yesterday. Pt reports that there is a knot on the arch of her left foot. She notes that her legs are warm to touch. Pt states she has been active recently standing a lot in church yesterday. Pt reports associated aching in her legs, SOB and swelling in her abdomen. Pt denies having any URI-like symptoms recently. Pt denies CP or any other complaints at this time.  Past Medical History:  Diagnosis Date  . Asthma   . Blind   . Blindness and low vision    left eye glass eye,  legally blind in right eye  . Diabetes mellitus   . Diabetic neuropathy ([place])   . Hyperlipidemia   . Hypertension   . Hypothyroidism   . Spinal stenosis     Patient Active Problem List   Diagnosis Date Noted  . Near syncope 01/30/2016  . SOB (shortness of breath)   . CAP (community acquired pneumonia) 09/12/2015  . Hypoxia 09/12/2015  . Hypertension 07/05/2015  . Hypotension 07/05/2015  . Diabetes mellitus with neurological manifestations ([place]) 11/04/2014  . Hyperlipidemia LDL goal <70 11/04/2014  . Spinal stenosis, multilevel 11/04/2014  . Hyperkalemia 11/01/2014  . Hypoglycemia 08/09/2014  . DM (diabetes mellitus), type 2, uncontrolled, with hyperosmolarity ([place]) 08/08/2014  . Elevated troponin 08/08/2014  . Nausea vomiting and diarrhea 08/08/2014   . Dizziness 08/06/2012  . Orthostatic hypotension 08/06/2012  . Hypernatremia 08/06/2012  . Acute gastroenteritis 08/13/2011  . Gastroparesis 08/13/2011  . Chest pain 08/13/2011  . DM (diabetes mellitus) ([place]) 08/13/2011  . Oral thrush 08/13/2011  . Gastroenteritis 05/23/2011  . Dehydration 05/23/2011  . Blindness 05/23/2011  . DM type 1, not at goal, causing eye disease ([place]) 05/23/2011  . Asthma 05/23/2011  . Diarrhea 05/23/2011  . Vomiting 05/23/2011  . Hypothyroidism 05/23/2011  . Diabetic neuropathy ([place]) 05/23/2011  . Diabetic nephropathy ([place]) 05/23/2011    Past Surgical History:  Procedure Laterality Date  . ABDOMINAL HYSTERECTOMY  1990  . [place]   x 2  . CHOLECYSTECTOMY  2008  . ENUCLEATION Bilateral 09/15/1998  . EYE SURGERY  2016   fitting artificial eye    OB History    Obstetric Comments   Pt has two sons.       Home Medications    Prior to Admission medications   Medication Sig Start Date End Date Taking? Authorizing Provider  acetaminophen (TYLENOL) 325 MG tablet Take 2 tablets (650 mg total) by mouth every 6 (six) hours as needed for mild pain (or Fever >/= 101). 02/01/16   Robbie Lis, MD  albuterol (PROVENTIL HFA;VENTOLIN HFA) 108 (90 BASE) MCG/ACT inhaler Inhale 2 puffs into the lungs every 4 (four) hours as needed for wheezing or shortness of breath. For short    [provider]  aspirin EC 81 MG tablet  Take 81 mg by mouth daily.    [provider]  clobetasol ointment (TEMOVATE) 8.18 % Apply 1 application topically 2 (two) times daily as needed (dermatosis).  06/05/15   [provider]  Dulaglutide (TRULICITY) 2.99 BZ/1.6RC SOPN Inject 0.5 mLs into the skin once a week.     [provider]  esomeprazole (NEXIUM) 40 MG capsule Take 40 mg by mouth 2 (two) times daily before a meal.     [provider]  fluorometholone (FML FORTE) 0.25 % ophthalmic suspension Place 1 drop into the  right eye 2 (two) times daily.    [provider]  furosemide (LASIX) 40 MG tablet  09/02/16   [provider]  HUMALOG KWIKPEN 100 UNIT/ML KiwkPen Inject 8-14 Units into the skin 3 (three) times daily. 8 units with breakfast 10 units with lunch and 14 units with dinner 12/26/13   [provider]  LEVEMIR FLEXTOUCH 100 UNIT/ML Pen Inject 45 Units into the skin every evening. 09/21/15   Eugenie Filler, MD  levothyroxine (SYNTHROID, LEVOTHROID) 150 MCG tablet Take 150 mcg by mouth daily. 10/28/14   [provider]  Linaclotide Rolan Lipa) 145 MCG CAPS capsule Take 145 mcg by mouth daily as needed (constipation).     [provider]  LYRICA 150 MG capsule Take 150 mg by mouth 3 (three) times daily. 10/28/14   [provider]  montelukast (SINGULAIR) 10 MG tablet Take 10 mg by mouth at bedtime.    [provider]  neomycin-polymyxin b-dexamethasone (MAXITROL) 3.5-10000-0.1 OINT Use 1/4" ribbon to affected eye once a day as needed for antibiotic. If you develop allergy symptoms, stop & call our office. 05/13/15   [provider]  simvastatin (ZOCOR) 20 MG tablet Take 20 mg by mouth at bedtime.     [provider]    Family History Family History  Problem Relation Age of Onset  . Diabetes Sister   . Glaucoma Sister   . Hypertension Sister   . Stroke Brother   . Heart attack Paternal Aunt     Social History Social History  Substance Use Topics  . Smoking status: Former Smoker    Types: Cigarettes    Quit date: 05/22/1992  . Smokeless tobacco: Never Used  . Alcohol use No     Comment: quit 20 yrs ago     Allergies   Penicillins; Codeine; Iodine-131; Iohexol; Lisinopril; and Sulfa antibiotics   Review of Systems Review of Systems  Respiratory: Positive for shortness of breath.   Cardiovascular: Positive for leg swelling. Negative for chest pain.  Musculoskeletal: Positive for myalgias.  All other systems  reviewed and are negative.    Physical Exam Updated Vital Signs BP (!) 142/76   Pulse 79   Temp 98.7 F (37.1 C) (Oral)   Resp 20   SpO2 (!) 87%   Physical Exam  Constitutional: She is oriented to person, place, and time. She appears well-developed and well-nourished. No distress.  HENT:  Head: Normocephalic and atraumatic.  Right Ear: Hearing normal.  Left Ear: Hearing normal.  Nose: Nose normal.  Mouth/Throat: Oropharynx is clear and moist and mucous membranes are normal.  Eyes: Conjunctivae and EOM are normal. Pupils are equal, round, and reactive to light.  Neck: Normal range of motion. Neck supple.  Cardiovascular: Normal rate, regular rhythm, S1 normal, S2 normal and normal heart sounds.  Exam reveals no gallop and no friction rub.   No murmur heard. 1+ in both lower extremities.  Pulmonary/Chest: Effort normal and breath sounds normal. No respiratory distress. She exhibits no tenderness.  Abdominal: Soft. Normal appearance and bowel sounds are normal. There is no hepatosplenomegaly. There is no tenderness. There is no rebound, no guarding, no tenderness at McBurney's point and negative Murphy's sign. No hernia.  Musculoskeletal: Normal range of motion. She exhibits edema and tenderness.  1 cm nodule on the arch of the plantar aspect of foot. It is tender but not fluctuant.   Neurological: She is alert and oriented to person, place, and time. She has normal strength. No cranial nerve deficit or sensory deficit. Coordination normal. GCS eye subscore is 4. GCS verbal subscore is 5. GCS motor subscore is 6.  Skin: Skin is warm, dry and intact. No rash noted. No cyanosis.  Psychiatric: She has a normal mood and affect. Her speech is normal and behavior is normal. Thought content normal.  Nursing note and vitals reviewed.    ED Treatments / Results  DIAGNOSTIC STUDIES: Oxygen Saturation is 98% on RA, normal by my interpretation.   COORDINATION OF CARE: 11:15 PM-Discussed  next steps with pt including blood work, XR of her foot and Chest XR. Pt verbalized understanding and is agreeable with the plan.   Labs (all labs ordered are listed, but only abnormal results are displayed) Labs Reviewed  BASIC METABOLIC PANEL - Abnormal; Notable for the following:       Result Value   Glucose, Bld 121 (*)    BUN 21 (*)    Creatinine, Ser 1.12 (*)    GFR calc non Af Amer 53 (*)    All other components within normal limits  CBC - Abnormal; Notable for the following:    Hemoglobin 11.4 (*)    HCT 34.2 (*)    MCV 76.7 (*)    MCH 25.6 (*)    All other components within normal limits  BRAIN NATRIURETIC PEPTIDE - Abnormal; Notable for the following:    B Natriuretic Peptide 107.5 (*)    All other components within normal limits  I-STAT TROPOININ, ED    EKG  EKG Interpretation  Date/Time:  Monday October 17 2016 18:32:32 EDT Ventricular Rate:  59 PR Interval:  138 QRS Duration: 78 QT Interval:  440 QTC Calculation: 435 R Axis:   51 Text Interpretation:  Sinus bradycardia Otherwise normal ECG no acute changes  Confirmed by Brantley Stage [phone]) on 10/17/2016 10:57:03 PM       Radiology Dg Chest 2 View  Result Date: 10/17/2016 CLINICAL DATA:  Shortness of breath.  Swelling of both legs. EXAM: CHEST  2 VIEW COMPARISON:  07/03/2016 FINDINGS: The heart size and mediastinal contours are within normal limits. Both lungs are clear. The visualized skeletal structures are unremarkable. IMPRESSION: No active cardiopulmonary disease. Electronically Signed   By: Lorriane Shire M.D.   On: 10/17/2016 19:12   Dg Foot Complete Left  Result Date: 10/18/2016 CLINICAL DATA:  Initial evaluation for acute pain with swelling. EXAM: LEFT FOOT - COMPLETE 3+ VIEW COMPARISON:  None. FINDINGS: There is no evidence of fracture or dislocation. Joint spaces maintained without significant degenerative or erosive arthropathy. Tiny plantar calcaneal enthesophyte noted. Female degenerative spurring noted at  the dorsal midfoot. The Soft tissues demonstrate no acute abnormality. IMPRESSION: No acute osseous abnormality about the left foot. Electronically Signed   By: Jeannine Boga M.D.   On: 10/18/2016 00:50    Procedures Procedures (including critical care time)  Medications Ordered in ED Medications  furosemide (LASIX) injection 80  mg (80 mg Intravenous Given 10/17/16 2353)     Initial Impression / Assessment and Plan / ED Course  I have reviewed the triage vital signs and the nursing notes.  Pertinent labs & imaging results that were available during my care of the patient were reviewed by me and considered in my medical decision making (see chart for details).     Patient presents to the emergency department for evaluation of swelling of both of her legs. She does have a history of diastolic heart failure as well as noncardiac peripheral edema. Patient not experiencing any chest pain or shortness of breath. Her workup today does not suggest decompensated congestive heart failure. Lungs are clear on auscultation, chest x-ray shows no evidence of fluid, BNP 107.5. Remainder of blood work is normal, kidney function is at her baseline. Patient administered IV Lasix with diuresis, will temporarily increase Lasix and follow-up with primary doctor. Compression stockings.  Patient complaining of a bump on her foot. Examination does reveal a tender nodule on the plantar aspect of the instep region of the left foot. There is no overlying erythema, warmth, induration or fluctuance. The area appears to be callused. Will refer to podiatry for recheck of this area, but does not appear to be a diabetic infection at this time.  Final Clinical Impressions(s) / ED Diagnoses   Final diagnoses:  Peripheral edema    New Prescriptions New Prescriptions   No medications on file  I personally performed the services described in this documentation, which was scribed in my presence. The recorded  information has been reviewed and is accurate.     Orpah Greek, MD 10/18/16 [phone]

## 2016-10-18 ENCOUNTER — Ambulatory Visit (INDEPENDENT_AMBULATORY_CARE_PROVIDER_SITE_OTHER): Payer: Medicare Other | Admitting: Neurology

## 2016-10-18 ENCOUNTER — Emergency Department (HOSPITAL_COMMUNITY): Payer: Medicare Other

## 2016-10-18 DIAGNOSIS — G4733 Obstructive sleep apnea (adult) (pediatric): Secondary | ICD-10-CM | POA: Diagnosis not present

## 2016-10-18 DIAGNOSIS — R600 Localized edema: Secondary | ICD-10-CM | POA: Diagnosis not present

## 2016-10-18 DIAGNOSIS — G4720 Circadian rhythm sleep disorder, unspecified type: Secondary | ICD-10-CM

## 2016-10-18 LAB — BRAIN NATRIURETIC PEPTIDE: B NATRIURETIC PEPTIDE 5: 107.5 pg/mL — AB (ref 0.0–100.0)

## 2016-10-18 LAB — CBG MONITORING, ED: Glucose-Capillary: 243 mg/dL — ABNORMAL HIGH (ref 65–99)

## 2016-10-18 NOTE — Discharge Instructions (Signed)
Take double Lasix doses for the next 2 days, then see your primary doctor by the end of the week for a recheck. Rest and elevate the legs as frequently as possible, minimize standing and sitting for long periods of time. Watch your sodium intake.

## 2016-10-21 NOTE — Addendum Note (Signed)
Addended by: [name] on: 10/21/2016 11:24 AM   Modules accepted: Orders

## 2016-10-21 NOTE — Progress Notes (Signed)
Patient referred by Dr. Ardeth Perfect, seen by me on 09/22/16, diagnostic PSG on 10/02/16, CPAP study on 10/18/16.   Please call and inform patient that I have entered an order for treatment with positive airway pressure (PAP) treatment of obstructive sleep apnea (OSA). She did well during the latest sleep study with CPAP. We will, therefore, arrange for a machine for home use through a DME (durable medical equipment) company of Her choice; and I will see the patient back in follow-up in about 10 weeks or with Nurse pract if needed. Please also explain to the patient that I will be looking out for compliance data, which can be downloaded from the machine (stored on an SD card, that is inserted in the machine) or via remote access through a modem, that is built into the machine. At the time of the followup appointment we will discuss sleep study results and how it is going with PAP treatment at home. Please advise patient to bring Her machine at the time of the first FU visit, even though this is cumbersome. Bringing the machine for every visit after that will likely not be needed, but often helps for the first visit to troubleshoot if needed. Please re-enforce the importance of compliance with treatment and the need for [place] to monitor compliance data - often an insurance requirement and actually good feedback for the patient as far as how they are doing.  Also remind patient, that any interim PAP machine or mask issues should be first addressed with the DME company, as they can often help better with technical and mask fit issues. Please ask if patient has a preference regarding DME company.  Please also make sure, the patient has a follow-up appointment with me or one of our Nurse practitioners in about 10 weeks from the setup date, thanks.  Once you have spoken to the patient - and faxed/routed report to PCP and referring MD (if other than PCP), you can close this encounter, thanks,   [name], MD, PhD [facility] ([place])

## 2016-10-21 NOTE — Procedures (Signed)
PATIENT'S NAME:  Amy [name], Show DOB:      09-Apr-1961      MR#:    [id number]     DATE OF RECORDING: 10/18/2016 REFERRING M.D.:  Velna Hatchet, MD Study Performed:   CPAP  Titration HISTORY: 56 year old woman with a history of diabetes, blindness (with L prosthetic eye and R eye blindness d/t diabetes), hyperlipidemia, asthma, arthritis, spinal stenosis, thyroid disease, nephropathy, orthostatic hypotension deemed secondary to diabetic autonomic neuropathy, and morbid obesity, who returns for a full night CPAP titration. Her diagnostic PSG on 10/02/16 showed a total AHI of 6.8/h, REM AHI of 28/h, and O2 nadir of 78%. The patient endorsed the Epworth Sleepiness Scale at 14/24 points. BMI of 40.4 kg/m2. The patient's neck circumference measured 16 inches.  CURRENT MEDICATIONS: Acetaminophen, Albuterol, Aspirin, Clobetasol, Dulaglutide, ?Esomeprazole, Fluorometholone, Furosemide, Humalog kwickpen, Levothyroxine, Linaclotide, Syrica, Montelukast, Neomycin-Polymyxin and Simvastatin   PROCEDURE:  This is a multichannel digital polysomnogram utilizing the SomnoStar 11.2 system.  Electrodes and sensors were applied and monitored per AASM Specifications.   EEG, EOG, Chin and Limb EMG, were sampled at 200 Hz.  ECG, Snore and Nasal Pressure, Thermal Airflow, Respiratory Effort, CPAP Flow and Pressure, Oximetry was sampled at 50 Hz. Digital video and audio were recorded.      The patient was fitted with medium nasal pillows. CPAP was initiated at 5 cmH20 with heated humidity per AASM standards and pressure was advanced to 9 cmH20 because of hypopneas, apneas and desaturations.  At a PAP pressure of 9 cmH20, there was a reduction of the AHI to 0/hour, with non-supine REM sleep achieved and O2 nadir of 86%.    Lights Out was at 22:23 and Lights On at 05:00. Total recording time (TRT) was 397.5 minutes, with a total sleep time (TST) of 339.5 minutes. The patient's sleep latency was 34 minutes, which is delayed, REM  latency was 194 minutes, which is delayed. The sleep efficiency was 85.4 %.    SLEEP ARCHITECTURE: WASO (Wake after sleep onset) was 26 minutes with mild sleep fragmentation noted.  There were 10.5 minutes in Stage N1, 261.5 minutes Stage N2, 26.5 minutes Stage N3 and 41 minutes in Stage REM.  The percentage of Stage N1 was 3.1%, Stage N2 was 77.%, which is increased, Stage N3 was 7.8% and Stage R (REM sleep) was 12.1%, which is reduced.  The arousals were noted as: 18 were spontaneous, 0 were associated with PLMs, 6 were associated with respiratory events. Audio and video analysis did not show any abnormal or unusual movements, behaviors, phonations or vocalizations. The patient took no bathroom breaks.The EKG was in keeping with normal sinus rhythm (NSR).  RESPIRATORY ANALYSIS:  There was a total of 7 respiratory events: 0 obstructive apneas, 0 central apneas and 0 mixed apneas with a total of 0 apneas and an apnea index (AI) of 0 /hour. There were 7 hypopneas with a hypopnea index of 1.2/hour. The patient also had 0 respiratory event related arousals (RERAs).      The total APNEA/HYPOPNEA INDEX  (AHI) was 1.2 /hour and the total RESPIRATORY DISTURBANCE INDEX was 1.2 .hour  0 events occurred in REM sleep and 7 events in NREM. The REM AHI was 0 /hour versus a non-REM AHI of 1.4 /hour.  The patient spent 11.5 minutes of total sleep time in the supine position and 328 minutes in non-supine. The supine AHI was 5.2, versus a non-supine AHI of 1.1.  OXYGEN SATURATION & C02:  The baseline 02 saturation was  91%, with the lowest being 81%. Time spent below 89% saturation equaled 59 minutes.  PERIODIC LIMB MOVEMENTS: The patient had a total of 0 Periodic Limb Movements. The Periodic Limb Movement (PLM) index was 0 and the PLM Arousal index was 0 /hour.    Post-study, the patient indicated that sleep was better than usual.   DIAGNOSIS 1. Obstructive Sleep Apnea (OSA) 2. Dysfunctions associated with Sleep  stages or arousal from sleep    PLANS/RECOMMENDATIONS:  1. This study demonstrates resolution of the patient's obstructive sleep apnea with CPAP therapy. Due to her O2 nadir of 86% on the final pressure of 9 cm and only non-supine REM sleep achieved, I will start the patient on home CPAP treatment at a pressure of 10 cm via medium nasal pillows with heated humidity. The patient should be reminded to be fully compliant with PAP therapy to improve sleep related symptoms and decrease long term cardiovascular risks. The patient should be reminded, that it may take up to 3 months to get fully used to using PAP with all planned sleep. The earlier full compliance is achieved, the better long term compliance tends to be. Please note that untreated obstructive sleep apnea carries additional perioperative morbidity. Patients with significant obstructive sleep apnea should receive perioperative PAP therapy and the surgeons and particularly the anesthesiologist should be informed of the diagnosis and the severity of the sleep disordered breathing. 2. This study shows sleep fragmentation and abnormal sleep stage percentages; these are nonspecific findings and per se do not signify an intrinsic sleep disorder or a cause for the patient's sleep-related symptoms. Causes include (but are not limited to) the first night effect of the sleep study, circadian rhythm disturbances, medication effect or an underlying mood disorder or medical problem.  3. The patient should be cautioned not to drive or be at heights, or operate dangerous or heavy equipment when tired or sleepy. Review and reiteration of good sleep hygiene measures should be pursued with any patient. 4. The patient will be seen in follow-up by Dr. Rexene Alberts at [facility] for discussion of the test results and further management strategies. The referring provider will be notified of the test results.  I certify that I have reviewed the entire raw data recording prior to the  issuance of this report in accordance with the Standards of Accreditation of the American Academy of Sleep Medicine (AASM)   [name], MD, PhD Diplomat, American Board of Psychiatry and Neurology (Neurology and Sleep Medicine)

## 2016-10-27 ENCOUNTER — Telehealth: Payer: Self-pay | Admitting: Podiatry

## 2016-10-27 ENCOUNTER — Telehealth: Payer: Self-pay

## 2016-10-27 NOTE — Telephone Encounter (Signed)
-----   Message from [name], MD sent at 10/21/2016 11:24 AM EDT ----- Patient referred by Dr. Ardeth Perfect, seen by me on 09/22/16, diagnostic PSG on 10/02/16, CPAP study on 10/18/16.   Please call and inform patient that I have entered an order for treatment with positive airway pressure (PAP) treatment of obstructive sleep apnea (OSA). She did well during the latest sleep study with CPAP. We will, therefore, arrange for a machine for home use through a DME (durable medical equipment) company of Her choice; and I will see the patient back in follow-up in about 10 weeks or with Nurse pract if needed. Please also explain to the patient that I will be looking out for compliance data, which can be downloaded from the machine (stored on an SD card, that is inserted in the machine) or via remote access through a modem, that is built into the machine. At the time of the followup appointment we will discuss sleep study results and how it is going with PAP treatment at home. Please advise patient to bring Her machine at the time of the first FU visit, even though this is cumbersome. Bringing the machine for every visit after that will likely not be needed, but often helps for the first visit to troubleshoot if needed. Please re-enforce the importance of compliance with treatment and the need for [place] to monitor compliance data - often an insurance requirement and actually good feedback for the patient as far as how they are doing.  Also remind patient, that any interim PAP machine or mask issues should be first addressed with the DME company, as they can often help better with technical and mask fit issues. Please ask if patient has a preference regarding DME company.  Please also make sure, the patient has a follow-up appointment with me or one of our Nurse practitioners in about 10 weeks from the setup date, thanks.  Once you have spoken to the patient - and faxed/routed report to PCP and referring MD (if other than PCP),  you can close this encounter, thanks,   [name], MD, PhD [facility] ([place])

## 2016-10-27 NOTE — Telephone Encounter (Signed)
Pt called asking the status of her diabetic shoes.  I checked and per Safestep it was signed by the wrong person(NP) not the doctor. Rick reviewed and is calling to check to see what is going on with this pts because it looks to him the doctor co signed the paperwork.  Pt was notified that we are working on this and will call her as soon as we get it straighten out.

## 2016-10-27 NOTE — Telephone Encounter (Signed)
I called pt. I advised pt that Dr. Rexene Alberts reviewed their sleep study results and found that pt did well with the cpap. Dr. Rexene Alberts recommends that pt start a cpap at home. I reviewed PAP compliance expectations with the pt. Pt is agreeable to starting a CPAP. I advised pt that an order will be sent to a DME, [facility], and [facility] will call the pt within about one week after they file with the pt's insurance. [facility] will show the pt how to use the machine, fit for masks, and troubleshoot the CPAP if needed. A follow up appt was made for insurance purposes with Dr. Rexene Alberts on Thursday, August 16th, 2018 at 9:30am . Pt verbalized understanding to arrive 15 minutes early and bring their CPAP. A letter with all of this information in it will be mailed to the pt as a reminder. I verified with the pt that the address we have on file is correct. Pt verbalized understanding of results. Pt had no questions at this time but was encouraged to call back if questions arise.

## 2016-10-28 ENCOUNTER — Telehealth: Payer: Self-pay | Admitting: Cardiovascular Disease

## 2016-10-28 NOTE — Telephone Encounter (Signed)
10/28/2016 Received fax referral from [facility] for upcoming appointment with Dr. Gwenlyn Found on 11/08/2016 @ 10:00am.  Records given to [facility]. cbr

## 2016-11-01 ENCOUNTER — Inpatient Hospital Stay (HOSPITAL_COMMUNITY): Admission: RE | Admit: 2016-11-01 | Payer: Medicare Other | Source: Ambulatory Visit | Admitting: Nurse Practitioner

## 2016-11-08 ENCOUNTER — Encounter: Payer: Self-pay | Admitting: Cardiovascular Disease

## 2016-11-08 ENCOUNTER — Encounter (INDEPENDENT_AMBULATORY_CARE_PROVIDER_SITE_OTHER): Payer: Self-pay

## 2016-11-08 ENCOUNTER — Ambulatory Visit (INDEPENDENT_AMBULATORY_CARE_PROVIDER_SITE_OTHER): Payer: Medicare Other | Admitting: Cardiovascular Disease

## 2016-11-08 VITALS — BP 152/78 | HR 69 | Ht 59.0 in | Wt 206.0 lb

## 2016-11-08 DIAGNOSIS — I5032 Chronic diastolic (congestive) heart failure: Secondary | ICD-10-CM | POA: Insufficient documentation

## 2016-11-08 DIAGNOSIS — I10 Essential (primary) hypertension: Secondary | ICD-10-CM

## 2016-11-08 MED ORDER — TORSEMIDE 20 MG PO TABS: 20.0000 mg | ORAL_TABLET | Freq: Every day | ORAL | 3 refills | Status: DC

## 2016-11-08 NOTE — Patient Instructions (Signed)
Medication Instructions: Increase Torsemide to 40 mg daily   Labwork: Your physician recommends that you return for lab work in: 2 weeks--BMET   Follow-Up: We request that you follow-up in: 4 weeks with an extender and in 6 months with Dr Andria Rhein will receive a reminder letter in the mail two months in advance. If you don't receive a letter, please call our office to schedule the follow-up appointment.  If you need a refill on your cardiac medications before your next appointment, please call your pharmacy.

## 2016-11-08 NOTE — Assessment & Plan Note (Signed)
History of chronic diastolic heart failure with echo performed 07/06/15 revealing normal LV systolic function with grade 2 diastolic dysfunction. She is on torsemide with chronic lower extremity edema and weight gain despite not eating salt. We will increase her torsemide from 20-40 mg a day and recheck blood work in 2 weeks. She'll see a mid-level provider back in one month.

## 2016-11-08 NOTE — Progress Notes (Signed)
11/08/2016 Amy [name]   1960-11-15  [id number]  Primary Physician Velna Hatchet, MD Primary Cardiologist: Lorretta Harp MD Renae Gloss  HPI:  Amy [name] is a 56 year old moderately overweight single African-American female mother of 2 children referred by Dr. Ardeth Perfect for cardiovascular evaluation because of weight gain and edema. She had previously seen Dr Nadyne Coombes nfor cardiovascular treatment. She has a history of essential hypertension, diabetes and hyperlipidemia. She has obstructive sleep apnea on sleep As well as diastolic heart failure. She had a negative Myoview 10/10/14 and a 2-D echo performed 07/06/15 that revealed normal LV function and grade 2 diastolic dysfunction. She does have diabetes with complications including blindness for the last 18 years and peripheral neuropathy. She says that she does not eat salt. She's gained 10 or 12 pounds in the last 6 months has had 2 ER visits for lower extremity edema requiring supplemental furosemide.   Current Outpatient Prescriptions  Medication Sig Dispense Refill  . acetaminophen (TYLENOL) 325 MG tablet Take 2 tablets (650 mg total) by mouth every 6 (six) hours as needed for mild pain (or Fever >/= 101). 30 tablet 0  . albuterol (PROVENTIL HFA;VENTOLIN HFA) 108 (90 BASE) MCG/ACT inhaler Inhale 2 puffs into the lungs every 4 (four) hours as needed for wheezing or shortness of breath. For short    . aspirin EC 81 MG tablet Take 81 mg by mouth daily.    . clobetasol ointment (TEMOVATE) 5.10 % Apply 1 application topically 2 (two) times daily as needed (dermatosis).     . Dulaglutide (TRULICITY) 2.58 NI/7.7OE SOPN Inject 0.5 mLs into the skin once a week.     . esomeprazole (NEXIUM) 40 MG capsule Take 40 mg by mouth 2 (two) times daily before a meal.     . fluorometholone (FML FORTE) 0.25 % ophthalmic suspension Place 1 drop into the right eye 2 (two) times daily.    . furosemide (LASIX) 40 MG tablet     . HUMALOG KWIKPEN  100 UNIT/ML KiwkPen Inject 8-14 Units into the skin 3 (three) times daily. 8 units with breakfast 10 units with lunch and 14 units with dinner    . LEVEMIR FLEXTOUCH 100 UNIT/ML Pen Inject 45 Units into the skin every evening. 15 mL 0  . levothyroxine (SYNTHROID, LEVOTHROID) 150 MCG tablet Take 150 mcg by mouth daily.    . Linaclotide (LINZESS) 145 MCG CAPS capsule Take 145 mcg by mouth daily as needed (constipation).     Marland Kitchen LYRICA 150 MG capsule Take 150 mg by mouth 3 (three) times daily.    . montelukast (SINGULAIR) 10 MG tablet Take 10 mg by mouth at bedtime.    Marland Kitchen neomycin-polymyxin b-dexamethasone (MAXITROL) 3.5-10000-0.1 OINT Use 1/4" ribbon to affected eye once a day as needed for antibiotic. If you develop allergy symptoms, stop & call our office.    . simvastatin (ZOCOR) 20 MG tablet Take 20 mg by mouth at bedtime.     . torsemide (DEMADEX) 20 MG tablet Take 20 mg by mouth daily.     No current facility-administered medications for this visit.     Allergies  Allergen Reactions  . Penicillins Hives and Swelling    Has patient had a PCN reaction causing immediate rash, facial/tongue/throat swelling, SOB or lightheadedness with hypotension: yes- face swelling Has patient had a PCN reaction causing severe rash involving mucus membranes or skin necrosis: no Has patient had a PCN reaction that required hospitalization unknown (childhood allergy) Has  patient had a PCN reaction occurring within the last 10 years: no If all of the above answers are "NO", then may proceed with Cephalosporin use.   . Codeine Nausea Only  . Iodine-131 Hives    hives  . Iohexol Hives     Code: HIVES, Desc: pt developed itching and hives along with nasal congestion; needs 13 hour premeds for future studies, Onset Date: [id number]   . Lisinopril Cough  . Sulfa Antibiotics Nausea And Vomiting    Social History   Social History  . Marital status: Single    Spouse name: N/A  . Number of children: 2  . Years  of education: 12   Occupational History  . N/A     disabled   Social History Main Topics  . Smoking status: Former Smoker    Types: Cigarettes    Quit date: 05/22/1992  . Smokeless tobacco: Never Used  . Alcohol use No     Comment: quit 20 yrs ago  . Drug use: No  . Sexual activity: Not Currently   Other Topics Concern  . Not on file   Social History Narrative   Lives alone   caffeine drinks 2 cups of coffee a day, occasional soda      Review of Systems: General: negative for chills, fever, night sweats or weight changes.  Cardiovascular: negative for chest pain, dyspnea on exertion, edema, orthopnea, palpitations, paroxysmal nocturnal dyspnea or shortness of breath Dermatological: negative for rash Respiratory: negative for cough or wheezing Urologic: negative for hematuria Abdominal: negative for nausea, vomiting, diarrhea, bright red blood per rectum, melena, or hematemesis Neurologic: negative for visual changes, syncope, or dizziness All other systems reviewed and are otherwise negative except as noted above.    Blood pressure (!) 152/78, pulse 69, height 4\' 11"  (1.499 m), weight 206 lb (93.4 kg).  General appearance: alert and no distress Neck: no adenopathy, no carotid bruit, no JVD, supple, symmetrical, trachea midline and thyroid not enlarged, symmetric, no tenderness/mass/nodules Lungs: clear to auscultation bilaterally Heart: regular rate and rhythm, S1, S2 normal, no murmur, click, rub or gallop Extremities: 1-2+ pitting edema bilaterally  EKG sinus rhythm at 69 without ST or T-wave changes. I personally reviewed this EKG.  ASSESSMENT AND PLAN:   Hypertension History of essential hypertension blood pressures measured at 152/78. She is on no blood pressure medications.  Chronic diastolic heart failure (HCC) History of chronic diastolic heart failure with echo performed 07/06/15 revealing normal LV systolic function with grade 2 diastolic dysfunction. She is  on torsemide with chronic lower extremity edema and weight gain despite not eating salt. We will increase her torsemide from 20-40 mg a day and recheck blood work in 2 weeks. She'll see a mid-level provider back in one month.      Lorretta Harp MD FACP,FACC,FAHA, [facility] 11/08/2016 10:15 AM

## 2016-11-08 NOTE — Assessment & Plan Note (Signed)
History of essential hypertension blood pressures measured at 152/78. She is on no blood pressure medications.

## 2016-11-09 NOTE — Telephone Encounter (Signed)
Dawn, shoes will arrive today, please sched her to come in to pu.Marland KitchenMarland Kitchen

## 2016-11-10 ENCOUNTER — Encounter: Payer: Self-pay | Admitting: Podiatry

## 2016-11-10 ENCOUNTER — Ambulatory Visit (INDEPENDENT_AMBULATORY_CARE_PROVIDER_SITE_OTHER): Payer: Medicare Other

## 2016-11-10 ENCOUNTER — Ambulatory Visit (INDEPENDENT_AMBULATORY_CARE_PROVIDER_SITE_OTHER): Payer: Medicare Other | Admitting: Podiatry

## 2016-11-10 DIAGNOSIS — M722 Plantar fascial fibromatosis: Secondary | ICD-10-CM | POA: Diagnosis not present

## 2016-11-10 DIAGNOSIS — R52 Pain, unspecified: Secondary | ICD-10-CM

## 2016-11-13 NOTE — Progress Notes (Signed)
Subjective: Amy [name] presents the office today for concerns of a painful knot in the arch of her foot on her left side which is been ongoing the last couple weeks. She denies stepping on any foreign objects and she denies any swelling or redness. She's had no recent treatment for this. She has a gradual onset. She has no other concerns today. Denies any systemic complaints such as fevers, chills, nausea, vomiting. No acute changes since last appointment, and no other complaints at this time.   Objective: AAO x3, NAD DP/PT pulses palpable bilaterally, CRT less than 3 seconds Within the medial band of plantar fascia within the arch of the foot on the left side there is a firm, mobile soft tissue mass present consistent with a plantar fibroma. There is no overlying skin changes no evidence of any puncture wound. There is no edema, erythema, increase in warmth. There is no open lesions or pre-ulcer lesions identified the foot otherwise. There is no pain with calf compression, swelling, warmth, erythema.  Assessment: 56 year old female left foot plantar fibroma   Plan: -Treatment options discussed including all alternatives, risks, and complications -Etiology of symptoms were discussed -X-rays were obtained and reviewed with the patient. There is no evidence of acute fracture identified. On the lateral view more in the heel there is a questionable area of radio density consistent with possible foreign body but this is not consistent with the area of the mass to her foot. I did discuss the spinous with the patient.  -I would are verapamil/scar cream for the left foot. This is ordered through  [employer].  -Stretching exercises.  -Discussed steroid injection which she was A period symptoms continue we will do a steroid injection.  -RTC 6 weeks or sooner if needed.   Celesta Gentile, DPM

## 2016-11-23 ENCOUNTER — Ambulatory Visit
Admission: RE | Admit: 2016-11-23 | Discharge: 2016-11-23 | Disposition: A | Discharge status: Home / Self Care | Payer: Medicare Other | Source: Ambulatory Visit | Attending: Internal Medicine | Admitting: Internal Medicine

## 2016-11-23 DIAGNOSIS — Z1231 Encounter for screening mammogram for malignant neoplasm of breast: Secondary | ICD-10-CM

## 2016-12-02 ENCOUNTER — Ambulatory Visit (INDEPENDENT_AMBULATORY_CARE_PROVIDER_SITE_OTHER): Payer: Medicare Other | Admitting: Orthotics

## 2016-12-02 DIAGNOSIS — E1140 Type 2 diabetes mellitus with diabetic neuropathy, unspecified: Secondary | ICD-10-CM | POA: Diagnosis not present

## 2016-12-02 DIAGNOSIS — M722 Plantar fascial fibromatosis: Secondary | ICD-10-CM | POA: Diagnosis not present

## 2016-12-02 DIAGNOSIS — E0842 Diabetes mellitus due to underlying condition with diabetic polyneuropathy: Secondary | ICD-10-CM

## 2016-12-02 DIAGNOSIS — Z794 Long term (current) use of insulin: Secondary | ICD-10-CM | POA: Diagnosis not present

## 2016-12-04 IMAGING — DX DG ABDOMEN ACUTE W/ 1V CHEST
3 series · 3 of 3 positions shown · non-contrast
Comparison: 09/14/2014 and abdominal CT 08/13/2011

CLINICAL DATA: Shortness of breath and diaphoresis. Hyperglycemia.
Chest pain.

EXAM:
DG ABDOMEN ACUTE W/ 1V CHEST

[chest pa]
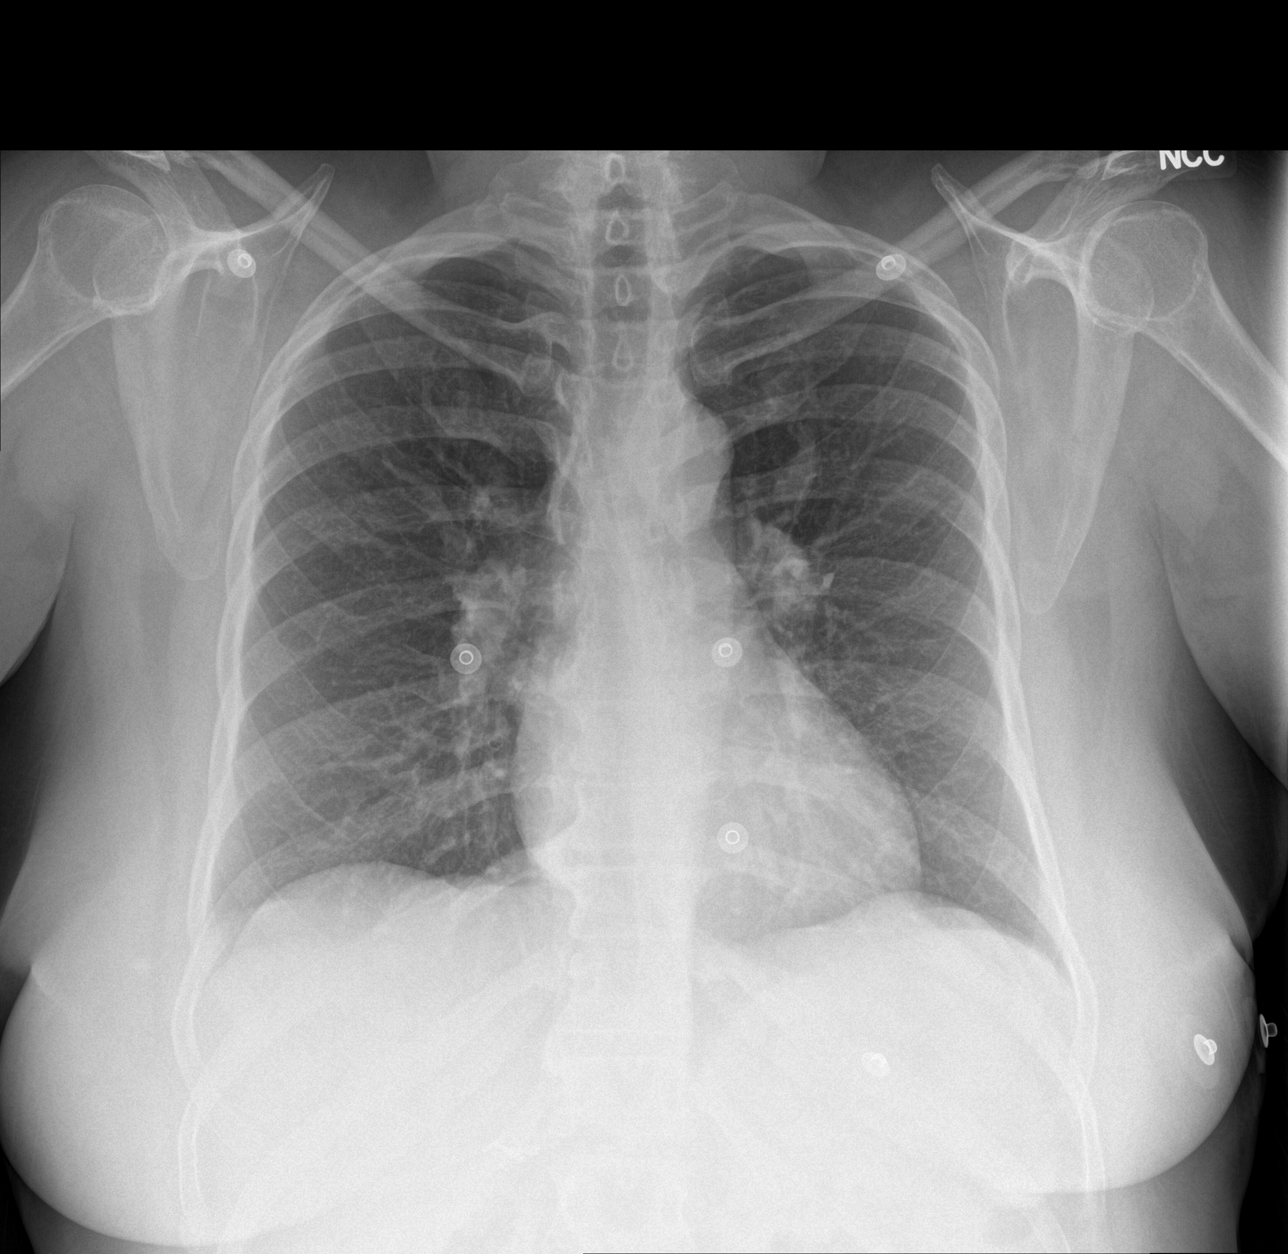

[abdomen erect]
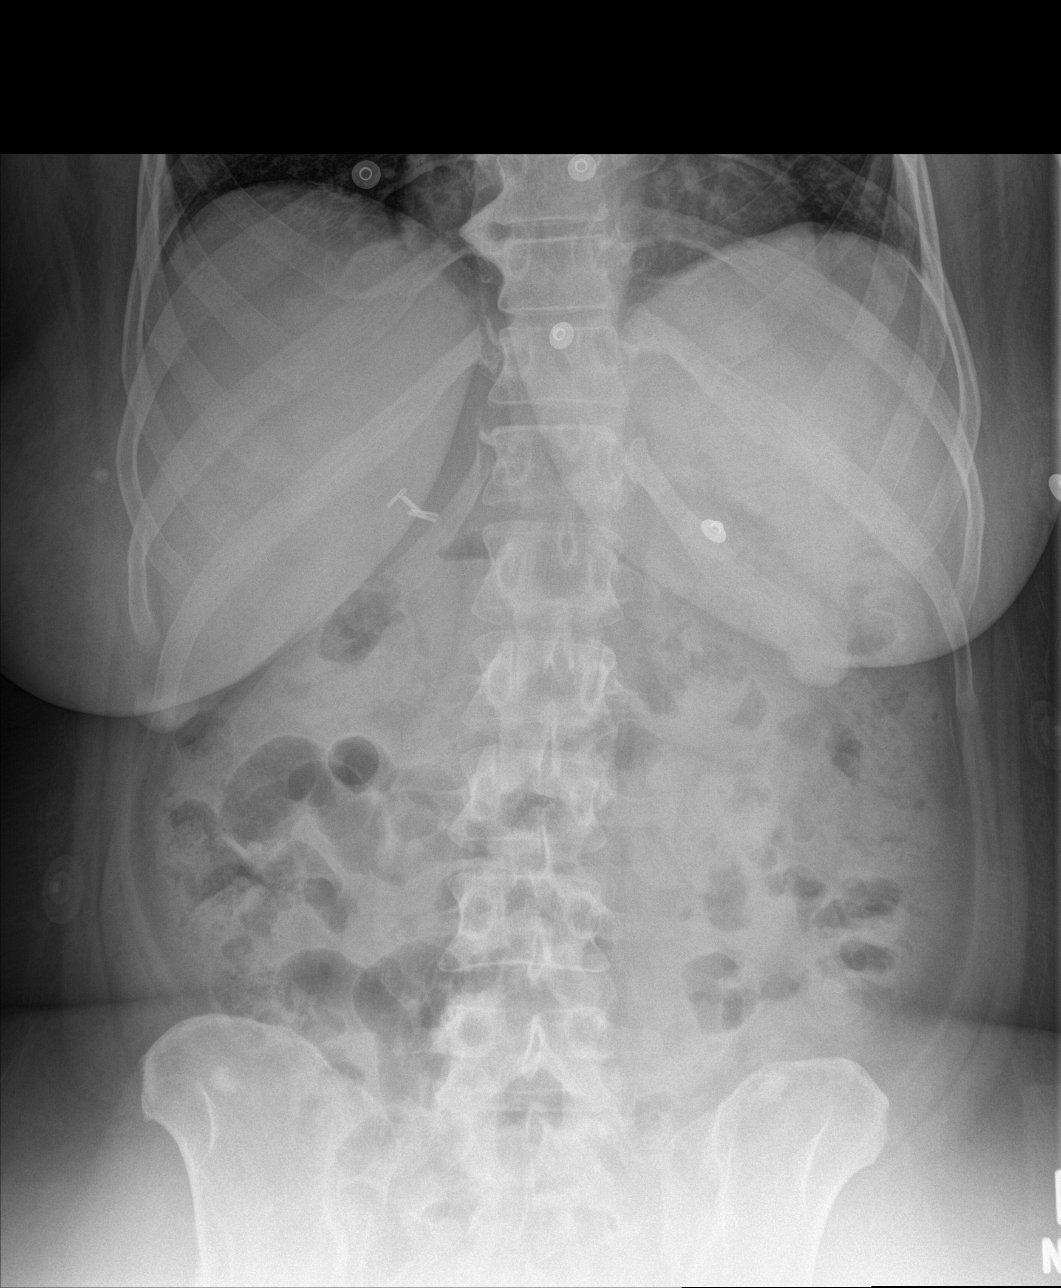

[abdomen supine]
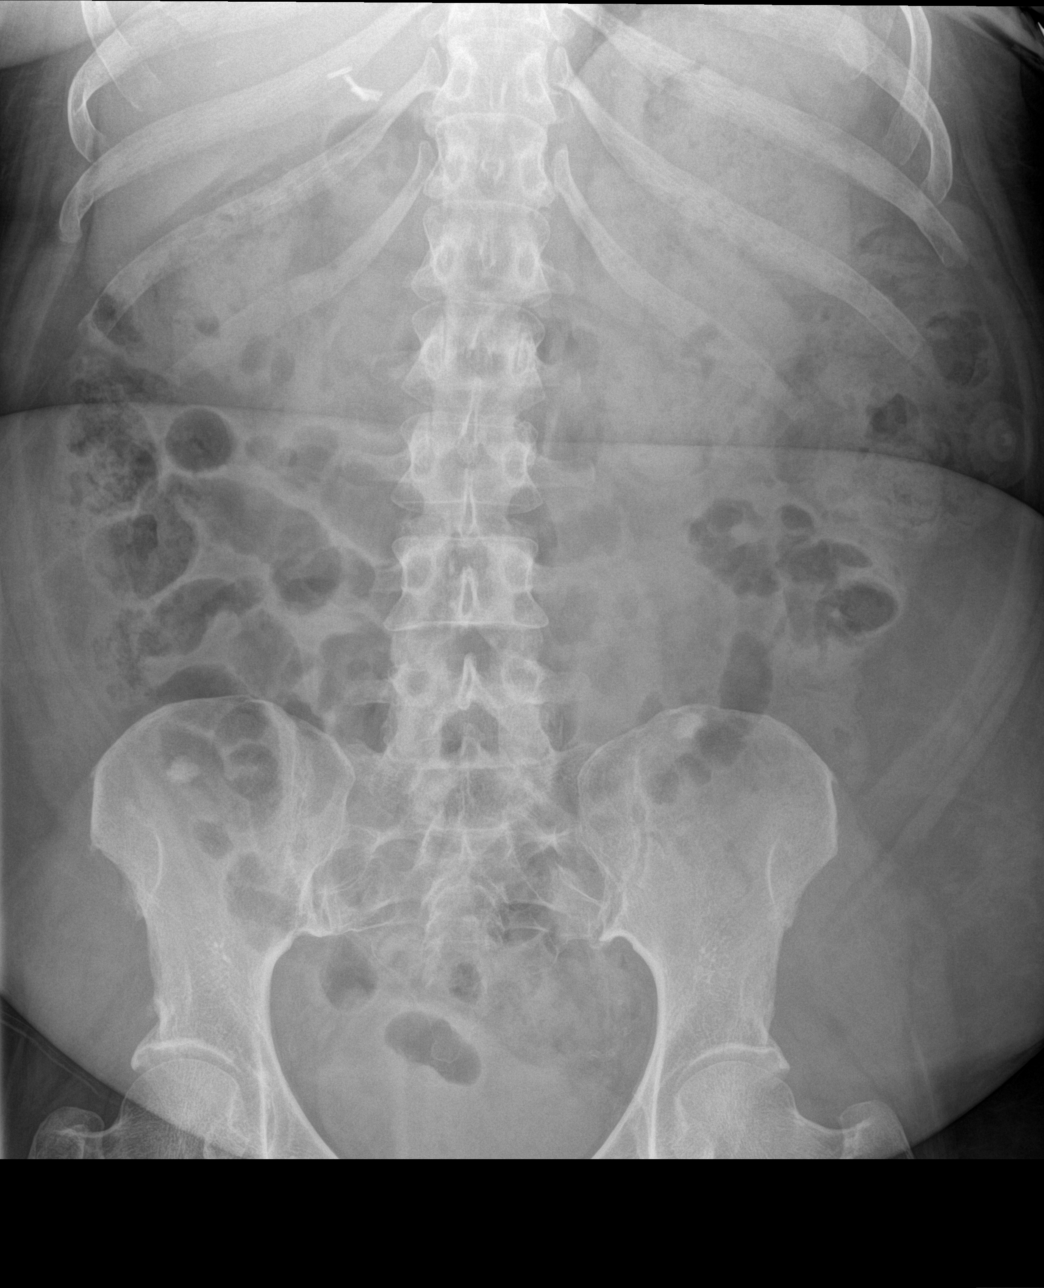

[3 of 3 positions shown; findings below may reference images not displayed]

FINDINGS: Both lungs are clear. Normal appearance of the heart and
mediastinum. The trachea is midline. Negative for free air.
Cholecystectomy clips in the right upper quadrant of the abdomen.
There is a nonspecific bowel gas pattern. Moderate amount of stool.
Again noted are sclerotic foci in the iliac wings suggestive for
bone islands.
IMPRESSION: Negative abdominal radiographs.  No acute cardiopulmonary disease.

## 2016-12-06 ENCOUNTER — Ambulatory Visit: Payer: Medicare Other | Admitting: Podiatry

## 2016-12-06 ENCOUNTER — Ambulatory Visit: Payer: Medicare Other | Admitting: Physician Assistant

## 2016-12-08 NOTE — Progress Notes (Signed)

## 2016-12-20 ENCOUNTER — Encounter: Payer: Self-pay | Admitting: Physician Assistant

## 2016-12-20 ENCOUNTER — Ambulatory Visit (INDEPENDENT_AMBULATORY_CARE_PROVIDER_SITE_OTHER): Payer: Medicare Other | Admitting: Physician Assistant

## 2016-12-20 VITALS — BP 132/76 | HR 72 | Ht 59.0 in | Wt 205.0 lb

## 2016-12-20 DIAGNOSIS — R600 Localized edema: Secondary | ICD-10-CM | POA: Diagnosis not present

## 2016-12-20 DIAGNOSIS — E785 Hyperlipidemia, unspecified: Secondary | ICD-10-CM | POA: Diagnosis not present

## 2016-12-20 DIAGNOSIS — E119 Type 2 diabetes mellitus without complications: Secondary | ICD-10-CM

## 2016-12-20 DIAGNOSIS — I5032 Chronic diastolic (congestive) heart failure: Secondary | ICD-10-CM

## 2016-12-20 DIAGNOSIS — Z79899 Other long term (current) drug therapy: Secondary | ICD-10-CM

## 2016-12-20 DIAGNOSIS — Z794 Long term (current) use of insulin: Secondary | ICD-10-CM

## 2016-12-20 DIAGNOSIS — G4733 Obstructive sleep apnea (adult) (pediatric): Secondary | ICD-10-CM | POA: Diagnosis not present

## 2016-12-20 DIAGNOSIS — E039 Hypothyroidism, unspecified: Secondary | ICD-10-CM

## 2016-12-20 DIAGNOSIS — I10 Essential (primary) hypertension: Secondary | ICD-10-CM | POA: Diagnosis not present

## 2016-12-20 MED ORDER — TORSEMIDE 20 MG PO TABS: 40.0000 mg | ORAL_TABLET | Freq: Every day | ORAL | 3 refills | Status: DC

## 2016-12-20 NOTE — Progress Notes (Signed)
Cardiology Office Note    Date:  12/21/2016   ID:  Amy [name], DOB [birth year], MRN [id number]  PCP:  Velna Hatchet, MD  Cardiologist:  Dr. Gwenlyn Found (previously seen by Dr. Einar Gip)  Chief Complaint  Patient presents with  . Follow-up    4 weeks   . Shortness of Breath    Walking long distances.    History of Present Illness:  Amy [name] is a 56 y.o. female with PMH of HTN, hypothyroidism, HLD, DM II, chronic diastolic heart failure and OSA. She was previously evaluated by Dr. Einar Gip for cardiorespiratory vascular treatment. She has obstructive sleep apnea as well as diastolic heart failure. She had a negative Myoview on 10/10/2014. 2-D echo performed on 07/06/2015 showed normal LV function, grade 2 diastolic dysfunction. She was last seen by Dr. Gwenlyn Found on 11/08/2016, her torsemide was increased from 20 mg to 40 mg daily.  She presents today for evaluation of lower extremity edema. After going all over her medication bottles, it turned out she is also on spironolactone 25 mg daily. After her torsemide was increased to 40 mg daily, she has not noticed significant urinary output. Also, because the patient is blind, she require her nursing staff to give her the torsemide. Since the previous torsemide prescription was not changed on her medication bottles, her nurses have been giving her 20 mg torsemide since the last visit without change the dose. I have wrote on her medication bottles to change the dosage to 40 mg daily of torsemide. I have also sent in a prescription to her pharmacy as well. I plan to obtain a BNP today and also BMP today and in 1 week. I will see her back in 3 weeks. She will also need a compression stockings well.   Past Medical History:  Diagnosis Date  . Asthma   . Blind   . Blindness and low vision    left eye glass eye,  legally blind in right eye  . Diabetes mellitus   . Diabetic neuropathy ([place])   . Hyperlipidemia   . Hypertension   . Hypothyroidism   .  Spinal stenosis     Past Surgical History:  Procedure Laterality Date  . ABDOMINAL HYSTERECTOMY  1990  . [place]   x 2  . CHOLECYSTECTOMY  2008  . ENUCLEATION Bilateral 09/15/1998  . EYE SURGERY  2016   fitting artificial eye    Current Medications: Outpatient Medications Prior to Visit  Medication Sig Dispense Refill  . acetaminophen (TYLENOL) 325 MG tablet Take 2 tablets (650 mg total) by mouth every 6 (six) hours as needed for mild pain (or Fever >/= 101). 30 tablet 0  . albuterol (PROVENTIL HFA;VENTOLIN HFA) 108 (90 BASE) MCG/ACT inhaler Inhale 2 puffs into the lungs every 4 (four) hours as needed for wheezing or shortness of breath. For short    . aspirin EC 81 MG tablet Take 81 mg by mouth daily.    . clobetasol ointment (TEMOVATE) 1.63 % Apply 1 application topically 2 (two) times daily as needed (dermatosis).     . Dulaglutide (TRULICITY) 8.46 KZ/9.9JT SOPN Inject 0.5 mLs into the skin once a week.     . esomeprazole (NEXIUM) 40 MG capsule Take 40 mg by mouth 2 (two) times daily before a meal.     . fluorometholone (FML FORTE) 0.25 % ophthalmic suspension Place 1 drop into the right eye 2 (two) times daily.    . furosemide (LASIX) 40  MG tablet     . HUMALOG KWIKPEN 100 UNIT/ML KiwkPen Inject 8-14 Units into the skin 3 (three) times daily. 8 units with breakfast 10 units with lunch and 14 units with dinner    . LEVEMIR FLEXTOUCH 100 UNIT/ML Pen Inject 45 Units into the skin every evening. 15 mL 0  . levothyroxine (SYNTHROID, LEVOTHROID) 150 MCG tablet Take 150 mcg by mouth daily.    . Linaclotide (LINZESS) 145 MCG CAPS capsule Take 145 mcg by mouth daily as needed (constipation).     Marland Kitchen LYRICA 150 MG capsule Take 150 mg by mouth 3 (three) times daily.    . montelukast (SINGULAIR) 10 MG tablet Take 10 mg by mouth at bedtime.    Marland Kitchen neomycin-polymyxin b-dexamethasone (MAXITROL) 3.5-10000-0.1 OINT Use 1/4" ribbon to affected eye once a day as needed for  antibiotic. If you develop allergy symptoms, stop & call our office.    . NONFORMULARY OR COMPOUNDED [place]  Scar Cream -  Verapamil 10%, Pentoxifylline 5% Apply 1-2 grams to affected area 3-4 times daily Qty. 120 gm 3 refills    . simvastatin (ZOCOR) 20 MG tablet Take 20 mg by mouth at bedtime.     . torsemide (DEMADEX) 20 MG tablet Take 1 tablet (20 mg total) by mouth daily. 90 tablet 3   No facility-administered medications prior to visit.      Allergies:   Penicillins; Codeine; Iodine-131; Iohexol; Lisinopril; and Sulfa antibiotics   Social History   Social History  . Marital status: Single    Spouse name: N/A  . Number of children: 2  . Years of education: 12   Occupational History  . N/A     disabled   Social History Main Topics  . Smoking status: Former Smoker    Types: Cigarettes    Quit date: 05/22/1992  . Smokeless tobacco: Never Used  . Alcohol use No     Comment: quit 20 yrs ago  . Drug use: No  . Sexual activity: Not Currently   Other Topics Concern  . None   Social History Narrative   Lives alone   caffeine drinks 2 cups of coffee a day, occasional soda      Family History:  The patient's family history includes Diabetes in her sister; Glaucoma in her sister; Heart attack in her paternal aunt; Hypertension in her sister; Stroke in her brother.   ROS:   Please see the history of present illness.    ROS All other systems reviewed and are negative.   PHYSICAL EXAM:   VS:  BP 132/76   Pulse 72   Ht 4\' 11"  (1.499 m)   Wt 205 lb (93 kg)   BMI 41.40 kg/m    GEN: Well nourished, well developed, in no acute distress  HEENT: normal  Neck: no JVD, carotid bruits, or masses Cardiac: RRR; no murmurs, rubs, or gallops,no edema  Respiratory:  clear to auscultation bilaterally, normal work of breathing GI: soft, nontender, nondistended, + BS MS: no deformity or atrophy  Skin: warm and dry, no rash Neuro:  Alert and Oriented x 3, Strength  and sensation are intact Psych: euthymic mood, full affect  Wt Readings from Last 3 Encounters:  12/20/16 205 lb (93 kg)  11/08/16 206 lb (93.4 kg)  09/22/16 200 lb (90.7 kg)      Studies/Labs Reviewed:   EKG:  EKG is not ordered today.   Recent Labs: 01/31/2016: TSH 2.285 07/03/2016: ALT 15 10/17/2016: B Natriuretic Peptide  107.5; BUN 21; Creatinine, Ser 1.12; Hemoglobin 11.4; Platelets 237; Potassium 3.7; Sodium 141   Lipid Panel    Component Value Date/Time   CHOL 170 10/22/2012 1351   TRIG 112 10/22/2012 1351   HDL 56 10/22/2012 1351   CHOLHDL 3.0 10/22/2012 1351   VLDL 22 10/22/2012 1351   LDLCALC 92 10/22/2012 1351    Additional studies/ records that were reviewed today include:   Echo 07/06/2015 LV EF: 60% -   65%  Study Conclusions  - Left ventricle: The cavity size was normal. Systolic function was   normal. The estimated ejection fraction was in the range of 60%   to 65%. Wall motion was normal; there were no regional wall   motion abnormalities. Features are consistent with a pseudonormal   left ventricular filling pattern, with concomitant abnormal   relaxation and increased filling pressure (grade 2 diastolic   dysfunction). Doppler parameters are consistent with high   ventricular filling pressure. - Mitral valve: There was trivial regurgitation. - Tricuspid valve: There was trivial regurgitation.      ASSESSMENT:    1. Lower extremity edema   2. Encounter for long-term (current) use of medications   3. Essential hypertension   4. Hyperlipidemia, unspecified hyperlipidemia type   5. Controlled type 2 diabetes mellitus without complication, with long-term current use of insulin ([place])   6. Hypothyroidism, unspecified type   7. Chronic diastolic heart failure ([place])   8. OSA (obstructive sleep apnea)      PLAN:  In order of problems listed above:  1. Lower extremity edema: We have went over her medication bottles, it turned out she is on  spironolactone that was previously not listed under her medication list. Since the increased dose of torsemide was not written on her medication bottles, her nurse who manages her medication has been giving her 20 mg daily instead. I have changed this to 40 mg. She will need a basic metabolic panel today and also in one week. I will also obtain a BNP as well as I think this is likely lower extremity venous issue instead of acute diastolic heart failure.  2. Hypertension: Blood pressure stable 132/74.   3. Hyperlipidemia: Continue Zocor 20 mg daily  4. DM 2: on insulin  5. Hypothyroidism: Continue Synthroid    Medication Adjustments/Labs and Tests Ordered: Current medicines are reviewed at length with the patient today.  Concerns regarding medicines are outlined above.  Medication changes, Labs and Tests ordered today are listed in the Patient Instructions below. Patient Instructions  Medication Instructions:   INCREASE Torsemide to 40mg  DAILY (2x 20mg  TABS)  Labwork:   BNP and BMET today.  Repeat BMET in [place].  Testing/Procedures:  None  Follow-Up:  In addition to increase of your medication torsemide, use compression stockings to help reduce swelling in your ankles. Keep legs elevated when possible. Avoid adding salt to your food. Use low salt foods. Limit your fluid intake to 2 liters per day.   Follow up with Almyra Deforest PA in 3 weeks.  If you need a refill on your cardiac medications before your next appointment, please call your pharmacy.      Hilbert Corrigan, [place]  12/21/2016 11:59 PM    [facility] [address], [place], [place]  [address] Phone: [phone]; Fax: [phone]

## 2016-12-20 NOTE — Patient Instructions (Addendum)
Medication Instructions:   INCREASE Torsemide to 40mg  DAILY (2x 20mg  TABS)  Labwork:   BNP and BMET today.  Repeat BMET in [place].  Testing/Procedures:  None  Follow-Up:  In addition to increase of your medication torsemide, use compression stockings to help reduce swelling in your ankles. Keep legs elevated when possible. Avoid adding salt to your food. Use low salt foods. Limit your fluid intake to 2 liters per day.   Follow up with Almyra Deforest PA in 3 weeks.  If you need a refill on your cardiac medications before your next appointment, please call your pharmacy.

## 2016-12-22 ENCOUNTER — Ambulatory Visit (INDEPENDENT_AMBULATORY_CARE_PROVIDER_SITE_OTHER): Payer: Medicare Other | Admitting: Podiatry

## 2016-12-22 ENCOUNTER — Encounter: Payer: Self-pay | Admitting: Podiatry

## 2016-12-22 ENCOUNTER — Encounter: Payer: Self-pay | Admitting: Physician Assistant

## 2016-12-22 DIAGNOSIS — B351 Tinea unguium: Secondary | ICD-10-CM | POA: Diagnosis not present

## 2016-12-22 DIAGNOSIS — E1140 Type 2 diabetes mellitus with diabetic neuropathy, unspecified: Secondary | ICD-10-CM | POA: Diagnosis not present

## 2016-12-22 DIAGNOSIS — Z794 Long term (current) use of insulin: Secondary | ICD-10-CM

## 2016-12-22 DIAGNOSIS — M722 Plantar fascial fibromatosis: Secondary | ICD-10-CM

## 2016-12-22 DIAGNOSIS — M79676 Pain in unspecified toe(s): Secondary | ICD-10-CM | POA: Diagnosis not present

## 2016-12-23 NOTE — Progress Notes (Signed)
Subjective: 56 y.o. returns the office today for painful, elongated, thickened toenails which she cannot trim herself.  Denies any redness or drainage around the nails. She states that the mass on the bottom the left foot still present causes some intermittent discomfort to the area. She did not get the verapamil cream due to cost. She was started on any steroid injection. She states for diabetic shoes are also not fitting because when she had the measures she was really swollen however the swelling has resolved and now her shoes are too big. She has not been wearing them. Denies any acute changes since last appointment and no new complaints today. Denies any systemic complaints such as fevers, chills, nausea, vomiting.   Objective: AAO 3, NAD DP/PT pulses palpable, CRT less than 3 seconds Nails hypertrophic, dystrophic, elongated, brittle, discolored 10. There is tenderness overlying the nails 1-5 bilaterally. There is no surrounding erythema or drainage along the nail sites. No open lesions or pre-ulcerative lesions are identified. On the plantar aspect the left foot on the medial band plantar fascia there is a well-defined fine non-mobile soft tissue mass consistent with a plantar fibroma. Mild tenderness to palpation to the area. No other areas of tenderness. No other areas of tenderness bilateral lower extremities. No overlying edema, erythema, increased warmth. No pain with calf compression, swelling, warmth, erythema.  Assessment: Patient presents with symptomatic onychomycosis; plantar fibroma  Plan: -Treatment options including alternatives, risks, complications were discussed -Nails sharply debrided 10 without complication/bleeding. -Patient declines steroid injection. Continue with stretching exercises daily as well as massage to help with this. Also ice to the area. -Discussed daily foot inspection. If there are any changes, to call the office immediately.  -Follow-up in 3 months  or sooner if any problems are to arise. In the meantime, encouraged to call the office with any questions, concerns, changes symptoms. -Follow-up with Liliane Channel or Benjie Karvonen for Diabetic shoe issues  Celesta Gentile, DPM

## 2016-12-27 ENCOUNTER — Encounter: Payer: Self-pay | Admitting: Neurology

## 2016-12-28 ENCOUNTER — Encounter (HOSPITAL_COMMUNITY): Payer: Self-pay | Admitting: Emergency Medicine

## 2016-12-28 ENCOUNTER — Emergency Department (HOSPITAL_COMMUNITY)
Admission: EM | Admit: 2016-12-28 | Discharge: 2016-12-28 | Disposition: A | Discharge status: Home / Self Care | Payer: Medicare Other | Attending: Emergency Medicine | Admitting: Emergency Medicine

## 2016-12-28 ENCOUNTER — Emergency Department (HOSPITAL_COMMUNITY): Payer: Medicare Other

## 2016-12-28 DIAGNOSIS — Z87891 Personal history of nicotine dependence: Secondary | ICD-10-CM | POA: Diagnosis not present

## 2016-12-28 DIAGNOSIS — E039 Hypothyroidism, unspecified: Secondary | ICD-10-CM | POA: Insufficient documentation

## 2016-12-28 DIAGNOSIS — I110 Hypertensive heart disease with heart failure: Secondary | ICD-10-CM | POA: Diagnosis not present

## 2016-12-28 DIAGNOSIS — Z7982 Long term (current) use of aspirin: Secondary | ICD-10-CM | POA: Diagnosis not present

## 2016-12-28 DIAGNOSIS — E1140 Type 2 diabetes mellitus with diabetic neuropathy, unspecified: Secondary | ICD-10-CM | POA: Diagnosis not present

## 2016-12-28 DIAGNOSIS — J45909 Unspecified asthma, uncomplicated: Secondary | ICD-10-CM | POA: Diagnosis not present

## 2016-12-28 DIAGNOSIS — M25561 Pain in right knee: Secondary | ICD-10-CM | POA: Diagnosis not present

## 2016-12-28 DIAGNOSIS — Z79899 Other long term (current) drug therapy: Secondary | ICD-10-CM | POA: Diagnosis not present

## 2016-12-28 DIAGNOSIS — M4722 Other spondylosis with radiculopathy, cervical region: Secondary | ICD-10-CM | POA: Diagnosis not present

## 2016-12-28 DIAGNOSIS — E1100 Type 2 diabetes mellitus with hyperosmolarity without nonketotic hyperglycemic-hyperosmolar coma (NKHHC): Secondary | ICD-10-CM | POA: Diagnosis not present

## 2016-12-28 DIAGNOSIS — I5032 Chronic diastolic (congestive) heart failure: Secondary | ICD-10-CM | POA: Insufficient documentation

## 2016-12-28 DIAGNOSIS — M542 Cervicalgia: Secondary | ICD-10-CM | POA: Diagnosis present

## 2016-12-28 DIAGNOSIS — Z794 Long term (current) use of insulin: Secondary | ICD-10-CM | POA: Insufficient documentation

## 2016-12-28 LAB — BASIC METABOLIC PANEL
BUN/Creatinine Ratio: 22 (ref 9–23)
BUN: 26 mg/dL — ABNORMAL HIGH (ref 6–24)
CALCIUM: 8.9 mg/dL (ref 8.7–10.2)
CHLORIDE: 105 mmol/L (ref 96–106)
CO2: 23 mmol/L (ref 20–29)
Creatinine, Ser: 1.2 mg/dL — ABNORMAL HIGH (ref 0.57–1.00)
GFR calc Af Amer: 58 mL/min/{1.73_m2} — ABNORMAL LOW (ref >59)
GFR calc non Af Amer: 50 mL/min/{1.73_m2} — ABNORMAL LOW (ref >59)
GLUCOSE: 185 mg/dL — AB (ref 65–99)
POTASSIUM: 4.3 mmol/L (ref 3.5–5.2)
SODIUM: 143 mmol/L (ref 134–144)

## 2016-12-28 LAB — PRO B NATRIURETIC PEPTIDE: NT-PRO BNP: 83 pg/mL (ref 0–287)

## 2016-12-28 MED ORDER — PREDNISONE 20 MG PO TABS
60.0000 mg | ORAL_TABLET | Freq: Once | ORAL | Status: AC
  Administered 2016-12-28: 60 mg via ORAL
  Filled 2016-12-28: qty 3

## 2016-12-28 MED ORDER — TRAMADOL HCL 50 MG PO TABS
50.0000 mg | ORAL_TABLET | Freq: Once | ORAL | Status: AC
  Administered 2016-12-28: 50 mg via ORAL
  Filled 2016-12-28: qty 1

## 2016-12-28 MED ORDER — TRAMADOL HCL 50 MG PO TABS: 50.0000 mg | ORAL_TABLET | Freq: Four times a day (QID) | ORAL | 0 refills | Status: DC | PRN

## 2016-12-28 MED ORDER — PREDNISONE 20 MG PO TABS: 20.0000 mg | ORAL_TABLET | Freq: Two times a day (BID) | ORAL | 0 refills | Status: DC

## 2016-12-28 NOTE — Discharge Instructions (Signed)
Treatments that should help your pain include rest, and heat to the affected areas.  Take the prescribed medicines, but be careful because tramadol can make you sleepy.  Use the brace on your right knee as needed for comfort.

## 2016-12-28 NOTE — ED Triage Notes (Signed)
Per EMS pt with hx of spinal stenosis c/o pain to spine and right side x 2 weeks, slipped and fell from chair to floor 1 week ago which led to worsening of pain. Muscle rigidness to right quadricepts. Pain with palpation of spine. Urinary incontinence x several years.

## 2016-12-28 NOTE — ED Provider Notes (Signed)
[facility] DEPT Provider Note   CSN: [id number] Arrival date & time: 12/28/16  6195     History   Chief Complaint Chief Complaint  Patient presents with  . Back Pain  . Fall    HPI Amy [name] is a 56 y.o. female.  She presents for evaluation of right-sided neck pain radiating to her right shoulder, and right leg.  Symptoms onset 1-1/2 weeks ago, and worsened after she slipped out of a chair, onto the floor, about 1 week ago.  She continues to be able to walk.  She has pain in her right knee which is worse when she moves it, and she feels like it is "crunching."  She denies fever, chills, cough, shortness of breath, chest pain, weakness or dizziness.  She is using her usual medications, without relief.  There are no other known modifying factors.   HPI  Past Medical History:  Diagnosis Date  . Asthma   . Blind   . Blindness and low vision    left eye glass eye,  legally blind in right eye  . Diabetes mellitus   . Diabetic neuropathy ([place])   . Hyperlipidemia   . Hypertension   . Hypothyroidism   . Spinal stenosis     Patient Active Problem List   Diagnosis Date Noted  . Chronic diastolic heart failure ([place]) 11/08/2016  . Near syncope 01/30/2016  . SOB (shortness of breath)   . CAP (community acquired pneumonia) 09/12/2015  . Hypoxia 09/12/2015  . Hypertension 07/05/2015  . Hypotension 07/05/2015  . Diabetes mellitus with neurological manifestations ([place]) 11/04/2014  . Hyperlipidemia LDL goal <70 11/04/2014  . Spinal stenosis, multilevel 11/04/2014  . Hyperkalemia 11/01/2014  . Hypoglycemia 08/09/2014  . DM (diabetes mellitus), type 2, uncontrolled, with hyperosmolarity ([place]) 08/08/2014  . Elevated troponin 08/08/2014  . Nausea vomiting and diarrhea 08/08/2014  . Dizziness 08/06/2012  . Orthostatic hypotension 08/06/2012  . Hypernatremia 08/06/2012  . Acute gastroenteritis 08/13/2011  . Gastroparesis 08/13/2011  . Chest pain 08/13/2011  . DM (diabetes  mellitus) ([place]) 08/13/2011  . Oral thrush 08/13/2011  . Gastroenteritis 05/23/2011  . Dehydration 05/23/2011  . Blindness 05/23/2011  . DM type 1, not at goal, causing eye disease ([place]) 05/23/2011  . Asthma 05/23/2011  . Diarrhea 05/23/2011  . Vomiting 05/23/2011  . Hypothyroidism 05/23/2011  . Diabetic neuropathy ([place]) 05/23/2011  . Diabetic nephropathy ([place]) 05/23/2011    Past Surgical History:  Procedure Laterality Date  . ABDOMINAL HYSTERECTOMY  1990  . Miguel Barrera   x 2  . CHOLECYSTECTOMY  2008  . ENUCLEATION Bilateral 09/15/1998  . EYE SURGERY  2016   fitting artificial eye    OB History    Obstetric Comments   Pt has two sons.       Home Medications    Prior to Admission medications   Medication Sig Start Date End Date Taking? Authorizing Provider  acetaminophen (TYLENOL) 325 MG tablet Take 2 tablets (650 mg total) by mouth every 6 (six) hours as needed for mild pain (or Fever >/= 101). 02/01/16  Yes Robbie Lis, MD  albuterol (PROVENTIL HFA;VENTOLIN HFA) 108 (90 BASE) MCG/ACT inhaler Inhale 2 puffs into the lungs every 4 (four) hours as needed for wheezing or shortness of breath. For short   Yes [provider]  aspirin EC 81 MG tablet Take 81 mg by mouth daily.   Yes [provider]  clobetasol ointment (TEMOVATE) 0.93 % Apply 1 application topically  2 (two) times daily as needed (dermatosis).  06/05/15  Yes [provider]  esomeprazole (NEXIUM) 40 MG capsule Take 40 mg by mouth 2 (two) times daily before a meal.    Yes [provider]  fluorometholone (FML FORTE) 0.25 % ophthalmic suspension Place 1 drop into the right eye 2 (two) times daily.   Yes [provider]  HUMALOG KWIKPEN 100 UNIT/ML KiwkPen Inject 8-14 Units into the skin 3 (three) times daily. 8 units with breakfast 10 units with lunch and 14 units with dinner 12/26/13  Yes [provider]  levothyroxine (SYNTHROID, LEVOTHROID) 150  MCG tablet Take 150 mcg by mouth daily. 10/28/14  Yes [provider]  Linaclotide (LINZESS) 145 MCG CAPS capsule Take 145 mcg by mouth daily as needed (constipation).    Yes [provider]  LYRICA 150 MG capsule Take 150 mg by mouth 3 (three) times daily. 10/28/14  Yes [provider]  montelukast (SINGULAIR) 10 MG tablet Take 10 mg by mouth at bedtime.   Yes [provider]  simvastatin (ZOCOR) 20 MG tablet Take 20 mg by mouth at bedtime.    Yes [provider]  spironolactone (ALDACTONE) 25 MG tablet Take 25 mg by mouth daily.   Yes [provider]  torsemide (DEMADEX) 20 MG tablet Take 2 tablets (40 mg total) by mouth daily. 12/20/16  Yes Meng, Hao, PA  LEVEMIR FLEXTOUCH 100 UNIT/ML Pen Inject 45 Units into the skin every evening. Patient not taking: Reported on 12/28/2016 09/21/15   Eugenie Filler, MD    Family History Family History  Problem Relation Age of Onset  . Diabetes Sister   . Glaucoma Sister   . Hypertension Sister   . Stroke Brother   . Heart attack Paternal Aunt     Social History Social History  Substance Use Topics  . Smoking status: Former Smoker    Types: Cigarettes    Quit date: 05/22/1992  . Smokeless tobacco: Never Used  . Alcohol use No     Comment: quit 20 yrs ago     Allergies   Penicillins; Codeine; Iodine-131; Iohexol; Lisinopril; and Sulfa antibiotics   Review of Systems Review of Systems  All other systems reviewed and are negative.    Physical Exam Updated Vital Signs BP (!) 153/73 (BP Location: Right Arm)   Pulse 71   Temp 97.8 F (36.6 C) (Oral)   Resp 18   SpO2 100%   Physical Exam  Constitutional: She is oriented to person, place, and time. She appears well-developed. No distress.  Overweight  HENT:  Head: Normocephalic and atraumatic.  Eyes: Pupils are equal, round, and reactive to light. Conjunctivae and EOM are normal.  Neck: Normal range of motion and phonation normal.  Neck supple.  Cardiovascular: Normal rate and regular rhythm.   Pulmonary/Chest: Effort normal and breath sounds normal. She exhibits no tenderness.  Abdominal: Soft. She exhibits no distension. There is no tenderness. There is no guarding.  Musculoskeletal:  Right knee with mild tenderness and questionable swelling.  No clear evidence for effusion.  Decreased flexion right knee secondary to pain.  Tenderness right lateral neck, no tenderness over the posterior cervical spines.  Good range of motion lower back.  Neurological: She is alert and oriented to person, place, and time. She exhibits normal muscle tone.  Skin: Skin is warm and dry.  Psychiatric: She has a normal mood and affect. Her behavior is normal. Judgment and thought content normal.  Nursing note  and vitals reviewed.    ED Treatments / Results  Labs (all labs ordered are listed, but only abnormal results are displayed) Labs Reviewed - No data to display  EKG  EKG Interpretation None       Radiology Dg Knee Complete 4 Views Right  Result Date: 12/28/2016 CLINICAL DATA:  Patient fell about a week ago and hit right knee against a wall. Pain. EXAM: RIGHT KNEE - COMPLETE 4+ VIEW COMPARISON:  05/24/2015 FINDINGS: No evidence of fracture, dislocation, or joint effusion. No evidence of arthropathy or other focal bone abnormality. Soft tissues are unremarkable. IMPRESSION: Negative. Electronically Signed   By: [name] M.D.   On: 12/28/2016 12:11    Procedures Procedures (including critical care time)  Medications Ordered in ED Medications  traMADol (ULTRAM) tablet 50 mg (50 mg Oral Given 12/28/16 1245)  predniSONE (DELTASONE) tablet 60 mg (60 mg Oral Given 12/28/16 1245)     Initial Impression / Assessment and Plan / ED Course  I have reviewed the triage vital signs and the nursing notes.  Pertinent labs & imaging results that were available during my care of the patient were reviewed by me and considered in my  medical decision making (see chart for details).      Patient Vitals for the past 24 hrs:  BP Temp Temp src Pulse Resp SpO2  12/28/16 1246 (!) 153/73 - - 71 18 100 %  12/28/16 1009 (!) 147/83 97.8 F (36.6 C) Oral 73 16 97 %   Knee sleeve ordered for comfort, right knee, pain.  1:38 PM Reevaluation with update and discussion. After initial assessment and treatment, an updated evaluation reveals she feels better after treatment, no further complaints.  Findings discussed with the patient and all questions were answered. Cleburne Savini L     Final Clinical Impressions(s) / ED Diagnoses   Final diagnoses:  Neck pain  Osteoarthritis of spine with radiculopathy, cervical region  Acute pain of right knee    Nonspecific musculoskeletal pain, history of degenerative joint disease, facet arthropathy, and reported spinal stenosis.  On associated right knee pain is nonspecific.  Doubt acute spinal myelopathy.  Doubt infectious process.  Nursing Notes Reviewed/ Care Coordinated Applicable Imaging Reviewed Interpretation of Laboratory Data incorporated into ED treatment  The patient appears reasonably screened and/or stabilized for discharge and I doubt any other medical condition or other [facility] requiring further screening, evaluation, or treatment in the ED at this time prior to discharge.  Plan: Home Medications-continue current medications; Home Treatments-rest, heat to affected areas, right knee sleeve as needed pain; return here if the recommended treatment, does not improve the symptoms; Recommended follow up-PCP follow-up 1 week   New Prescriptions New Prescriptions   No medications on file     Daleen Bo, MD 12/28/16 1343

## 2016-12-29 ENCOUNTER — Encounter: Payer: Self-pay | Admitting: Neurology

## 2016-12-29 ENCOUNTER — Ambulatory Visit (INDEPENDENT_AMBULATORY_CARE_PROVIDER_SITE_OTHER): Payer: Medicare Other | Admitting: Neurology

## 2016-12-29 ENCOUNTER — Telehealth: Payer: Self-pay | Admitting: *Deleted

## 2016-12-29 VITALS — BP 134/79 | HR 76 | Ht 59.0 in | Wt 205.0 lb

## 2016-12-29 DIAGNOSIS — Z9989 Dependence on other enabling machines and devices: Secondary | ICD-10-CM | POA: Diagnosis not present

## 2016-12-29 DIAGNOSIS — G4733 Obstructive sleep apnea (adult) (pediatric): Secondary | ICD-10-CM | POA: Diagnosis not present

## 2016-12-29 DIAGNOSIS — Z79899 Other long term (current) drug therapy: Secondary | ICD-10-CM

## 2016-12-29 NOTE — Progress Notes (Signed)
Subjective:    Patient ID: Amy [name] is a 56 y.o. female.  HPI     Interim history:   Amy [name] is a 56 year old right-handed woman with an underlying medical history of diabetes, blindness (with L prosthetic eye and R eye blindness d/t diabetes), hyperlipidemia, asthma, arthritis, spinal stenosis, thyroid disease, nephropathy, orthostatic hypotension deemed secondary to diabetic autonomic neuropathy (seen by Dr. Leta Baptist in 9/17), and morbid obesity, who presents for follow up consultation of her sleep apnea, after recent sleep study testing. The patient is unaccompanied today. I first met her on 09/22/16, at the request of her PCP, at which time she reported snoring and excessive daytime somnolence. I invited her for a sleep study. She had a baseline sleep study, followed by a CPAP titration study. I went over her test results with her in detail today. Baseline sleep study from 10/02/2016 showed a sleep efficiency of 84.8%, sleep latency of 18.5 minutes, REM latency of 72.5 minutes, she had absence of slow-wave sleep, an increased percentage of stage II sleep, REM sleep at 16.8%. Total AHI was 6.8 per hour, REM AHI 28 per hour, supine sleep was nearly absent. Average oxygen saturation was 95%, nadir was 78%, time below 89% saturation of 24 minutes. Based on her sleep-related complaints and significant desaturations noted during REM sleep I suggested she return for a full night CPAP titration study. She had this on 10/18/2016, sleep efficiency was 85.4%, sleep latency of 34 minutes, REM latency was 194 minutes. She had slow-wave sleep at 7.8%, REM sleep at 12.1%. She was fitted with medium nasal pillows and CPAP was titrated from 5 cm to 9 cm. On the final pressure her AHI was 0 per hour, nonsupine REM sleep was achieved and alternated was 86%. I suggested a home CPAP pressure of 10 cm.  Today, 12/29/2016 (all dictated new, as well as above notes, some dictation done in note pad or Word, outside of  chart, may appear as copied):  I reviewed her CPAP compliance data from 11/28/16 to 12/27/16, which is a total of 30 days, during which time she used her CPAP 26 days, with percent used days greater than 4 hours of 63%, indicating suboptimal compliance, average usage of 5 hours and 27 minutes, average AHI of 3/hour, leak low with the 95th percentile of 2.8 lpm on a pressure of 10 cm with EPR of 3. She reports feeling better, not as sleepy during the day. Generally speaking, she is tolerating the mask and the pressure but sometimes the head your slides off. She had a couple of nights where she was traveling, another time, there was something wrong with her machine and [employer] came back out to look at it. She has not changed her filter yet. She has not been advised how to wash the nasal pillows. The caretaker she had at the time left and she has a new caretaker now. She may need additional reeducation as far as maintaining her CPAP equipment and changing the supplies goes. She requests that [employer] come back out between the hours of 8 AM and 2 PM so her caretaker can understand how to change supplies. Of note, she presented to the emergency room on 09/27/2016 with pain after a recent fall.  Previously (copied from previous notes for reference):   09/22/16: (She) reports snoring and excessive daytime somnolence as well as breathing pauses while asleep. Her Epworth sleepiness score is 14 out of 24, fatigue score is 60 out of 63. She is single,  she does not work. She lives alone. She has 2 grown children. She quit smoking in 1996, and does not currently drink alcohol. She drinks caffeine in the form of coffee, usually 2 cups in the mornings. She does not drink sodas or tea on a daily basis.  She has a family history of obstructive sleep apnea in her sister, maternal aunt, and one cousin. The patient has been witnessed to have breathing pauses while asleep by her aide. She has an aide typically daily from 8 to 2 PM.  She has nocturia about twice per average night and has had occasional morning headaches. Bedtime is around 10 PM and wakeup time around 8 AM. She has had lower extremity swelling. She tries to elevate her legs and sometimes uses compression socks. She has tingling in her legs. She takes Lyrica. I reviewed your office note from 08/01/2016, which you kindly included.  The patient's allergies, current medications, family history, past medical history, past social history, past surgical history and problem list were reviewed and updated as appropriate.    Her Past Medical History Is Significant For: Past Medical History:  Diagnosis Date  . Asthma   . Blind   . Blindness and low vision    left eye glass eye,  legally blind in right eye  . Diabetes mellitus   . Diabetic neuropathy ([place])   . Hyperlipidemia   . Hypertension   . Hypothyroidism   . Spinal stenosis     Her Past Surgical History Is Significant For: Past Surgical History:  Procedure Laterality Date  . ABDOMINAL HYSTERECTOMY  1990  . [place]   x 2  . CHOLECYSTECTOMY  2008  . ENUCLEATION Bilateral 09/15/1998  . EYE SURGERY  2016   fitting artificial eye    Her Family History Is Significant For: Family History  Problem Relation Age of Onset  . Diabetes Sister   . Glaucoma Sister   . Hypertension Sister   . Stroke Brother   . Heart attack Paternal Aunt     Her Social History Is Significant For: Social History   Social History  . Marital status: Single    Spouse name: N/A  . Number of children: 2  . Years of education: 12   Occupational History  . N/A     disabled   Social History Main Topics  . Smoking status: Former Smoker    Types: Cigarettes    Quit date: 05/22/1992  . Smokeless tobacco: Never Used  . Alcohol use No     Comment: quit 20 yrs ago  . Drug use: No  . Sexual activity: Not Currently   Other Topics Concern  . None   Social History Narrative   Lives alone   caffeine  drinks 2 cups of coffee a day, occasional soda     Her Allergies Are:  Allergies  Allergen Reactions  . Penicillins Hives and Swelling    Has patient had a PCN reaction causing immediate rash, facial/tongue/throat swelling, SOB or lightheadedness with hypotension: yes- face swelling Has patient had a PCN reaction causing severe rash involving mucus membranes or skin necrosis: no Has patient had a PCN reaction that required hospitalization unknown (childhood allergy) Has patient had a PCN reaction occurring within the last 10 years: no If all of the above answers are "NO", then may proceed with Cephalosporin use.   . Codeine Nausea Only  . Iodine-131 Hives    hives  . Iohexol Hives  Code: HIVES, Desc: pt developed itching and hives along with nasal congestion; needs 13 hour premeds for future studies, Onset Date: [id number]   . Lisinopril Cough  . Sulfa Antibiotics Nausea And Vomiting  :   Her Current Medications Are:  Outpatient Encounter Prescriptions as of 12/29/2016  Medication Sig  . acetaminophen (TYLENOL) 325 MG tablet Take 2 tablets (650 mg total) by mouth every 6 (six) hours as needed for mild pain (or Fever >/= 101).  Marland Kitchen albuterol (PROVENTIL HFA;VENTOLIN HFA) 108 (90 BASE) MCG/ACT inhaler Inhale 2 puffs into the lungs every 4 (four) hours as needed for wheezing or shortness of breath. For short  . aspirin EC 81 MG tablet Take 81 mg by mouth daily.  . cetirizine (ZYRTEC) 10 MG tablet Take 10 mg by mouth daily.  Marland Kitchen esomeprazole (NEXIUM) 40 MG capsule Take 40 mg by mouth 2 (two) times daily before a meal.   . fluorometholone (FML FORTE) 0.25 % ophthalmic suspension Place 1 drop into the right eye 2 (two) times daily.  . fluticasone (FLONASE) 50 MCG/ACT nasal spray Place into both nostrils daily.  Marland Kitchen HUMALOG KWIKPEN 100 UNIT/ML KiwkPen Inject 8-14 Units into the skin 3 (three) times daily. 8 units with breakfast 10 units with lunch and 14 units with dinner  . LEVEMIR FLEXTOUCH 100  UNIT/ML Pen Inject 45 Units into the skin every evening.  . Linaclotide (LINZESS) 145 MCG CAPS capsule Take 145 mcg by mouth daily as needed (constipation).   Marland Kitchen LYRICA 150 MG capsule Take 150 mg by mouth 3 (three) times daily.  . predniSONE (DELTASONE) 20 MG tablet Take 1 tablet (20 mg total) by mouth 2 (two) times daily.  Marland Kitchen spironolactone (ALDACTONE) 25 MG tablet Take 25 mg by mouth daily.  . traMADol (ULTRAM) 50 MG tablet Take 1 tablet (50 mg total) by mouth every 6 (six) hours as needed.  . triamcinolone cream (KENALOG) 0.1 % Apply 1 application topically 2 (two) times daily.  . [DISCONTINUED] clobetasol ointment (TEMOVATE) 6.76 % Apply 1 application topically 2 (two) times daily as needed (dermatosis).   . [DISCONTINUED] levothyroxine (SYNTHROID, LEVOTHROID) 150 MCG tablet Take 150 mcg by mouth daily.  . [DISCONTINUED] montelukast (SINGULAIR) 10 MG tablet Take 10 mg by mouth at bedtime.  . [DISCONTINUED] simvastatin (ZOCOR) 20 MG tablet Take 20 mg by mouth at bedtime.   . [DISCONTINUED] torsemide (DEMADEX) 20 MG tablet Take 2 tablets (40 mg total) by mouth daily.  . TORSEMIDE PO Take by mouth.   No facility-administered encounter medications on file as of 12/29/2016.   :  Review of Systems:  Out of a complete 14 point review of systems, all are reviewed and negative with the exception of these symptoms as listed below:  Review of Systems  Neurological:       Pt presents today to follow up with her cpap. Pt says there is a high mask leak.    Objective:  Neurological Exam  Physical Exam Physical Examination:   Vitals:   12/29/16 0938  BP: 134/79  Pulse: 76   General Examination: The patient is a very pleasant 56 y.o. female in no acute distress. She appears well-developed and well-nourished and well groomed.   HEENT: Normocephalic, atraumatic, R pupil round, not reactive to light, s/p cataract removal. L eye prosthesis. Extraocular tracking is not possible to be tested. Hearing  is grossly intact. Face is symmetric with normal facial animation and normal facial sensation. Speech is clear with no dysarthria noted. There is no  hypophonia. There is no lip, neck/head, jaw or voice tremor. Oropharynx exam reveals: mild mouth dryness, adequate dental hygiene and marked airway crowding. Tongue protrudes centrally and palate elevates symmetrically.   Chest: Clear to auscultation without wheezing, rhonchi or crackles noted.  Heart: S1+S2+0, regular and normal without murmurs, rubs or gallops noted.   Abdomen: Soft, non-tender and non-distended with normal bowel sounds appreciated on auscultation.  Extremities: There is 1+ pitting edema in the distal lower extremities bilaterally.   Skin: Warm and dry without trophic changes noted.   Musculoskeletal: exam reveals no obvious joint deformities, tenderness or joint swelling or erythema.   Neurologically:  Mental status: The patient is awake, alert and oriented in all 4 spheres. Her immediate and remote memory, attention, language skills and fund of knowledge are appropriate. There is no evidence of aphasia, agnosia, apraxia or anomia. Speech is clear with normal prosody and enunciation. Thought process is linear. Mood is normal and affect is normal.  Cranial nerves II - XII are as described above under HEENT exam. Motor exam: Normal bulk, strength and tone is noted. There is no drift, tremor or rebound. Romberg is not tested for safety. Reflexes are 1+ in the UEs and absent in the LEs. Fine motor skills and coordination. Globally mildly impaired.  Cerebellar testing: No dysmetria or intention tremor. There is no truncal or gait ataxia.  Sensory exam: intact to light touch.  Gait, station and balance: She stands with difficulty. No veering to one side is noted. Using blind stick and walks with her Aide.    Assessment and Plan:  In summary, Amy [name] is a very pleasant 56 year old female with an underlying medical  history of diabetes, blindness (with L prosthetic eye and R eye blindness d/t diabetes), hyperlipidemia, asthma, arthritis, spinal stenosis, thyroid disease, nephropathy, orthostatic hypotension deemed secondary to diabetic autonomic neuropathy (for which she saw Dr. Leta Baptist in 9/17), and morbid obesity, whoPresents for follow-up consultation of her obstructive sleep apnea, her baseline sleep study in May 2018 indicated overall mild OSA but more significant REM related sleep apnea with significant desaturations during REM sleep. She returned in June 2018 for a titration study and has done well with CPAP of 10 cm. She is not quite optimal with her compliance. She has noted improvement in her sleep quality and daytime somnolence. She has some discomfort at times with a nasal interface and also has not changed her filter and has not been cleaning her nasal piece on a day-to-day basis. I will ask her DME company to send out another provider for reeducation in that regard. Furthermore, she is advised not to skip any nights and try to keep the mask on all night. Sometimes she sleeps in a recliner if she feels that her leg swelling is worse. I suggested a one-month checkup with one of our nurse practitioners just to check in with her in that we can space out the appointments more after that if she continues to do well. I answered all their questions today and the patient and her aide were in agreement.  I spent 25 minutes in total face-to-face time with the patient, more than 50% of which was spent in counseling and coordination of care, reviewing test results, reviewing medication and discussing or reviewing the diagnosis of OSA, its prognosis and treatment options. Pertinent laboratory and imaging test results that were available during this visit with the patient were reviewed by me and considered in my medical decision making (see chart for  details).

## 2016-12-29 NOTE — Patient Instructions (Addendum)
Please continue using your CPAP regularly. While your insurance requires that you use CPAP at least 4 hours each night on 70% of the nights, I recommend, that you not skip any nights and use it throughout the night if you can. Getting used to CPAP and staying with the treatment long term does take time and patience and discipline. Untreated obstructive sleep apnea when it is moderate to severe can have an adverse impact on cardiovascular health and raise her risk for heart disease, arrhythmias, hypertension, congestive heart failure, stroke and diabetes. Untreated obstructive sleep apnea causes sleep disruption, nonrestorative sleep, and sleep deprivation. This can have an impact on your day to day functioning and cause daytime sleepiness and impairment of cognitive function, memory loss, mood disturbance, and problems focussing. Using CPAP regularly can improve these symptoms.  I would like for you to make another appointment in one month with one of our nurse practitioners for a recheck.   So far, you have done a good job with it.

## 2016-12-29 NOTE — Telephone Encounter (Signed)
-----   Message from [place], [place] sent at 12/28/2016  5:08 PM EDT ----- No obvious sign of acute heart failure to be responsible for lower extremity edema. Kidney function worsening slightly compare to 5 month ago, but essentially unchanged compare to 2 month ago. Will continue on current medication. Recommend repeat BMET in 2 weeks to make sure kidney function is not worsening

## 2017-01-12 ENCOUNTER — Telehealth: Payer: Self-pay | Admitting: Cardiovascular Disease

## 2017-01-12 NOTE — Telephone Encounter (Signed)
New message   Amy [name] 8/31 11a   Pt c/o swelling: STAT is pt has developed SOB within 24 hours  1. How long have you been experiencing swelling?  3 day  2. Where is the swelling located?  Legs and feet gained 3.8lbs since monday  3.  Are you currently taking a "fluid pill"? Yes (pt was not taking medication as prescribed)    4.  Are you currently SOB?  yes  5.  Have you traveled recently? No   Also having a lot of fatigue

## 2017-01-12 NOTE — Telephone Encounter (Signed)
Returned call to patient she stated she has gained 3.8 lbs since Monday.Increased swelling in lower legs,sob.She already took Torsemide 40 mg and Aldactone 25 mg this morning.Spoke to DOD Dr.Hochrein he advised ok to take another 20 mg of Torsemide now.Advised to keep appointment as planned with Rosaria Ferries PA tomorrow at 11:00 am.

## 2017-01-13 ENCOUNTER — Ambulatory Visit (INDEPENDENT_AMBULATORY_CARE_PROVIDER_SITE_OTHER): Payer: Medicare Other | Admitting: Physician Assistant

## 2017-01-13 VITALS — BP 101/64 | HR 70 | Ht 59.0 in | Wt 208.0 lb

## 2017-01-13 DIAGNOSIS — I10 Essential (primary) hypertension: Secondary | ICD-10-CM

## 2017-01-13 DIAGNOSIS — I5033 Acute on chronic diastolic (congestive) heart failure: Secondary | ICD-10-CM | POA: Diagnosis not present

## 2017-01-13 MED ORDER — TORSEMIDE 20 MG PO TABS: ORAL_TABLET | ORAL | 3 refills | Status: DC

## 2017-01-13 NOTE — Progress Notes (Signed)
Cardiology Office Note   Date:  01/13/2017   ID:  Amy [name], Amy [name] Dec 30, 1960, MRN [id number]  PCP:  Velna Hatchet, MD  Cardiologist:  Dr. Gwenlyn Found 11/08/2016 Amy [name], [facility] 12/20/2016 Amy Ferries, PA-C   Chief Complaint  Patient presents with  . Follow-up    weight gain, sob, swelling , low bp    History of Present Illness: Amy [name] is a 56 y.o. female with a history of HTN, hypothyroidism, HLD, DM II, chronic diastolic heart failure and OSA. Neg MV 09/2014, EF nl on echo 06/2015 w/ grade 2 dd, blindness  Office visit 12/20/2016 for lower extremity edema, meds clarified in torsemide increased from 20 mg up to 40 mg daily  Amy [name] presents for cardiology follow up.   She is taking the medication as prescribed. She has a nurse that comes out and fills her pill box. She is still having LE edema and her weight has not changed. She wakes with the edema. Her abdomen is larger than it used to be.   Her breathing is ok but she gets SOB walking for a minute or so.   She normally uses a cane, but was having pain going all the way up her leg, so is using a rollator today. She has a bad back   Her BP was checked this morning and was 88/46, then 75/47.   She drinks 6-7 1/2 liter bottles of water daily. She felt a little tired when her blood pressure was so low and has dyspnea on exertion, but feels better now. She has eaten today.   Past Medical History:  Diagnosis Date  . Asthma   . Blind   . Blindness and low vision    left eye glass eye,  legally blind in right eye  . Diabetes mellitus   . Diabetic neuropathy ([place])   . Hyperlipidemia   . Hypertension   . Hypothyroidism   . Spinal stenosis     Past Surgical History:  Procedure Laterality Date  . ABDOMINAL HYSTERECTOMY  1990  . [place]   x 2  . CHOLECYSTECTOMY  2008  . ENUCLEATION Bilateral 09/15/1998  . EYE SURGERY  2016   fitting artificial eye    Medication Sig  .  acetaminophen (TYLENOL) 325 MG tablet Take 2 tablets (650 mg total) by mouth every 6 (six) hours as needed for mild pain (or Fever >/= 101).  Marland Kitchen albuterol (PROVENTIL HFA;VENTOLIN HFA) 108 (90 BASE) MCG/ACT inhaler Inhale 2 puffs into the lungs every 4 (four) hours as needed for wheezing or shortness of breath. For short  . aspirin EC 81 MG tablet Take 81 mg by mouth daily.  . cetirizine (ZYRTEC) 10 MG tablet Take 10 mg by mouth daily.  Marland Kitchen esomeprazole (NEXIUM) 40 MG capsule Take 40 mg by mouth 2 (two) times daily before a meal.   . fluorometholone (FML FORTE) 0.25 % ophthalmic suspension Place 1 drop into the right eye 2 (two) times daily.  . fluticasone (FLONASE) 50 MCG/ACT nasal spray Place into both nostrils daily.  Marland Kitchen HUMALOG KWIKPEN 100 UNIT/ML KiwkPen Inject 8-14 Units into the skin 3 (three) times daily. 8 units with breakfast 10 units with lunch and 14 units with dinner  . LEVEMIR FLEXTOUCH 100 UNIT/ML Pen Inject 45 Units into the skin every evening.  . Linaclotide (LINZESS) 145 MCG CAPS capsule Take 145 mcg by mouth daily as needed (constipation).   Marland Kitchen LYRICA 150 MG capsule Take  150 mg by mouth 3 (three) times daily.  . predniSONE (DELTASONE) 20 MG tablet Take 1 tablet (20 mg total) by mouth 2 (two) times daily.  Marland Kitchen spironolactone (ALDACTONE) 25 MG tablet Take 25 mg by mouth daily.  Marland Kitchen torsemide (DEMADEX) 20 MG tablet Take 2 tablets every morning  . traMADol (ULTRAM) 50 MG tablet Take 1 tablet (50 mg total) by mouth every 6 (six) hours as needed.  . triamcinolone cream (KENALOG) 0.1 % Apply 1 application topically 2 (two) times daily.   No current facility-administered medications for this visit.     Allergies:   Penicillins; Iohexol; Codeine; Iodine-131; Lisinopril; and Sulfa antibiotics    Social History:  The patient  reports that she quit smoking about 24 years ago. Her smoking use included Cigarettes. She has never used smokeless tobacco. She reports that she does not drink alcohol or  use drugs.   Family History:  The patient's family history includes Diabetes in her sister; Glaucoma in her sister; Heart attack in her paternal aunt; Hypertension in her sister; Stroke in her brother.    ROS:  Please see the history of present illness. All other systems are reviewed and negative.    PHYSICAL EXAM: VS:  BP 101/64 (BP Location: Right Arm, Patient Position: Sitting, Cuff Size: Normal)   Pulse 70   Ht 4\' 11"  (1.499 m)   Wt 208 lb (94.3 kg)   BMI 42.01 kg/m  , BMI Body mass index is 42.01 kg/m. GEN: Well nourished, well developed, female in no acute distress  HEENT: normal for age  Neck: JVD 9 cm, no carotid bruit, no masses Cardiac: RRR; soft murmur, no rubs, or gallops Respiratory: decreased BS bases bilaterally, normal work of breathing GI: soft, nontender, nondistended, + BS MS: no deformity or atrophy; 1-2+ edema; distal pulses are 2+ in all 4 extremities   Skin: warm and dry, no rash Neuro:  Strength and sensation are intact Psych: euthymic mood, full affect   EKG:  EKG is not ordered today.  ECHO 07/06/2015 LV EF: 60% - 65% Study Conclusions - Left ventricle: The cavity size was normal. Systolic function was normal. The estimated ejection fraction was in the range of 60% to 65%. Wall motion was normal; there were no regional wall motion abnormalities. Features are consistent with a pseudonormal left ventricular filling pattern, with concomitant abnormal relaxation and increased filling pressure (grade 2 diastolic dysfunction). Doppler parameters are consistent with high ventricular filling pressure. - Mitral valve: There was trivial regurgitation. - Tricuspid valve: There was trivial regurgitation.   Recent Labs: 01/31/2016: TSH 2.285 07/03/2016: ALT 15 10/17/2016: B Natriuretic Peptide 107.5; Hemoglobin 11.4; Platelets 237 12/27/2016: BUN 26; Creatinine, Ser 1.20; NT-Pro BNP 83; Potassium 4.3; Sodium 143    Lipid Panel      Component Value Date/Time   CHOL 170 10/22/2012 1351   TRIG 112 10/22/2012 1351   HDL 56 10/22/2012 1351   CHOLHDL 3.0 10/22/2012 1351   VLDL 22 10/22/2012 1351   LDLCALC 92 10/22/2012 1351     Wt Readings from Last 3 Encounters:  01/13/17 208 lb (94.3 kg)  12/29/16 205 lb (93 kg)  12/20/16 205 lb (93 kg)     Other studies Reviewed: Additional studies/ records that were reviewed today include: office notes, hospital records and testing.  ASSESSMENT AND PLAN:  1.  Acute on chronic diastolic CHF: Her weight has been increasing steadily. We discussed diet compliance and she may be getting some hidden salted foods but states  that her intake is not high enough for her to be very much. She does not use a salt shaker. Her aide is with her today and she is encouraged to read labels and be mindful of how much sodium is in the foods that she eats.  She is to increase the torsemide from 2 tablets daily to 3 tablets daily. If she does not lose weight on this dose, and 3 days she is supposed to go up to 4 tablets daily. If she looses weight on 3 tablets daily keep this dose until the edema improves. Check a BMET today. Follow-up in 2 weeks with a BMET  2. Hypertension: Her blood pressure has been running on the low side. We will hold the spironolactone for now. She is not on anything else except for the torsemide that affects her blood pressure.   Current medicines are reviewed at length with the patient today.  The patient does not have concerns regarding medicines.  The following changes have been made:    Labs/ tests ordered today include:  No orders of the defined types were placed in this encounter.    Disposition:   FU with Dr. Gwenlyn Found  Signed, Amy Ferries, PA-C  01/13/2017 5:24 PM    [place] Phone: [phone]; Fax: [phone]  This note was written with the assistance of speech recognition software. Please excuse any transcriptional  errors.

## 2017-01-13 NOTE — Patient Instructions (Signed)
Medication Instructions:  INCREASE TORSEMIDE TO 60MG  DAILY-IF YOU DO NOT LOOSE WEIGHT DISCUSSED GO UP TO 4 TAB DAILY  HOLD SPIRONOLACTONE UNTIL NEXT APPT IN 2 WEEKS If you need a refill on your cardiac medications before your next appointment, please call your pharmacy.  Labwork: BMET TODAY HERE IN OUR OFFICE AT [facility]  Follow-Up: Your physician wants you to follow-up in: 2 WEEKS WITH DR Gwenlyn Found, RHONDA OR [facility]O.   Special Instructions: 2 LITERS FLUID RESTRICTION DAILY  DAILY WEIGHTS  2,000 MG LOW SODIUM  DIET  Thank you for choosing [facility] at [facility]!!

## 2017-01-14 LAB — BASIC METABOLIC PANEL
BUN/Creatinine Ratio: 23 (ref 9–23)
BUN: 29 mg/dL — AB (ref 6–24)
CALCIUM: 9.6 mg/dL (ref 8.7–10.2)
CHLORIDE: 102 mmol/L (ref 96–106)
CO2: 23 mmol/L (ref 20–29)
Creatinine, Ser: 1.28 mg/dL — ABNORMAL HIGH (ref 0.57–1.00)
GFR calc non Af Amer: 47 mL/min/{1.73_m2} — ABNORMAL LOW (ref >59)
GFR, EST AFRICAN AMERICAN: 54 mL/min/{1.73_m2} — AB (ref >59)
GLUCOSE: 177 mg/dL — AB (ref 65–99)
POTASSIUM: 4.2 mmol/L (ref 3.5–5.2)
Sodium: 145 mmol/L — ABNORMAL HIGH (ref 134–144)

## 2017-01-17 NOTE — Telephone Encounter (Signed)
Recent adjustment of diuretic by Rosaria Ferries PA-C, will forward for review.

## 2017-01-19 ENCOUNTER — Ambulatory Visit: Payer: Medicare Other | Admitting: Orthotics

## 2017-01-19 DIAGNOSIS — M79676 Pain in unspecified toe(s): Principal | ICD-10-CM

## 2017-01-19 DIAGNOSIS — B351 Tinea unguium: Secondary | ICD-10-CM

## 2017-01-19 DIAGNOSIS — M722 Plantar fascial fibromatosis: Secondary | ICD-10-CM

## 2017-01-19 DIAGNOSIS — E0842 Diabetes mellitus due to underlying condition with diabetic polyneuropathy: Secondary | ICD-10-CM

## 2017-01-19 NOTE — Progress Notes (Signed)
Patient p/u reordered shoes and loves them.

## 2017-01-24 ENCOUNTER — Ambulatory Visit (INDEPENDENT_AMBULATORY_CARE_PROVIDER_SITE_OTHER): Payer: Medicare Other | Admitting: Cardiovascular Disease

## 2017-01-24 ENCOUNTER — Encounter: Payer: Self-pay | Admitting: Cardiovascular Disease

## 2017-01-24 DIAGNOSIS — I10 Essential (primary) hypertension: Secondary | ICD-10-CM

## 2017-01-24 DIAGNOSIS — I5032 Chronic diastolic (congestive) heart failure: Secondary | ICD-10-CM

## 2017-01-24 NOTE — Assessment & Plan Note (Signed)
History of chronic diastolic heart failure with chronic lower extremity edema on torsemide 3 times a day. Her renal function is stable serum creatinine of 1.28. She does have residual 1-2+ pitting edema. She denies significant shortness of breath.

## 2017-01-24 NOTE — Patient Instructions (Signed)
Medication Instructions:  Your physician recommends that you continue on your current medications as directed. Please refer to the Current Medication list given to you today.  Labwork: None  Testing/Procedures: None   Follow-Up: Your physician recommends that you schedule a follow-up appointment in: 3 months with Rosaria Ferries, PA  Your physician wants you to follow-up in: 12 MONTHS with DR BERRY. You will receive a reminder letter in the mail two months in advance. If you don't receive a letter, please call our office to schedule the follow-up appointment.  Any Other Special Instructions Will Be Listed Below (If Applicable).     If you need a refill on your cardiac medications before your next appointment, please call your pharmacy.

## 2017-01-24 NOTE — Assessment & Plan Note (Signed)
History of essential hypertension blood pressure 110/50.

## 2017-01-24 NOTE — Progress Notes (Signed)
01/24/2017 [name]   [birth year]  [id number]  Primary Physician Velna Hatchet, MD Primary Cardiologist: Lorretta Harp MD [name], [place], [place]  HPI:  Amy [name] is a 56 y.o. female  moderately overweight single African-American female mother of 2 children referred by Dr. Ardeth Perfect for cardiovascular evaluation because of weight gain and edema. I last saw her in the office 11/08/16. She had previously seen Dr Nadyne Coombes nfor cardiovascular treatment. She has a history of essential hypertension, diabetes and hyperlipidemia. She has obstructive sleep apnea on sleep As well as diastolic heart failure. She had a negative Myoview 10/10/14 and a 2-D echo performed 07/06/15 that revealed normal LV function and grade 2 diastolic dysfunction. She does have diabetes with complications including blindness for the last 18 years and peripheral neuropathy. She says that she does not eat salt. She's gained 10 or 12 pounds in the last 6 months has had 2 ER visits for lower extremity edema requiring supplemental furosemide. I did change her diuretics to torsemide TID . Her renal function is fairly stable. She continues to have 1-2+ pitting edema.   Current Meds  Medication Sig  . acetaminophen (TYLENOL) 325 MG tablet Take 2 tablets (650 mg total) by mouth every 6 (six) hours as needed for mild pain (or Fever >/= 101).  Marland Kitchen albuterol (PROVENTIL HFA;VENTOLIN HFA) 108 (90 BASE) MCG/ACT inhaler Inhale 2 puffs into the lungs every 4 (four) hours as needed for wheezing or shortness of breath. For short  . aspirin EC 81 MG tablet Take 81 mg by mouth daily.  . cetirizine (ZYRTEC) 10 MG tablet Take 10 mg by mouth daily.  Marland Kitchen esomeprazole (NEXIUM) 40 MG capsule Take 40 mg by mouth 2 (two) times daily before a meal.   . fluorometholone (FML FORTE) 0.25 % ophthalmic suspension Place 1 drop into the right eye 2 (two) times daily.  . fluticasone (FLONASE) 50 MCG/ACT nasal spray Place into both nostrils daily.  .  insulin degludec (TRESIBA FLEXTOUCH) 100 UNIT/ML SOPN FlexTouch Pen Inject 60 Units into the skin daily at 10 pm.  . LEVEMIR FLEXTOUCH 100 UNIT/ML Pen Inject 45 Units into the skin every evening.  . Linaclotide (LINZESS) 145 MCG CAPS capsule Take 145 mcg by mouth daily as needed (constipation).   Marland Kitchen LYRICA 150 MG capsule Take 150 mg by mouth 3 (three) times daily.  . predniSONE (DELTASONE) 20 MG tablet Take 1 tablet (20 mg total) by mouth 2 (two) times daily.  Marland Kitchen spironolactone (ALDACTONE) 25 MG tablet Take 25 mg by mouth daily.  Marland Kitchen torsemide (DEMADEX) 20 MG tablet Take 3 tablets every morning  . traMADol (ULTRAM) 50 MG tablet Take 1 tablet (50 mg total) by mouth every 6 (six) hours as needed.  . triamcinolone cream (KENALOG) 0.1 % Apply 1 application topically 2 (two) times daily.     Allergies  Allergen Reactions  . Penicillins Hives and Swelling    Has patient had a PCN reaction causing immediate rash, facial/tongue/throat swelling, SOB or lightheadedness with hypotension: yes- face swelling Has patient had a PCN reaction causing severe rash involving mucus membranes or skin necrosis: no Has patient had a PCN reaction that required hospitalization unknown (childhood allergy) Has patient had a PCN reaction occurring within the last 10 years: no If all of the above answers are "NO", then may proceed with Cephalosporin use.   . Iohexol Hives     Code: HIVES, Desc: pt developed itching and hives along with nasal congestion;  needs 13 hour premeds for future studies, Onset Date: [id number]   . Codeine Nausea Only  . Iodine-131 Hives    hives  . Lisinopril Cough  . Sulfa Antibiotics Nausea And Vomiting    Social History   Social History  . Marital status: Single    Spouse name: N/A  . Number of children: 2  . Years of education: 12   Occupational History  . N/A     disabled   Social History Main Topics  . Smoking status: Former Smoker    Types: Cigarettes    Quit date: 05/22/1992    . Smokeless tobacco: Never Used  . Alcohol use No     Comment: quit 20 yrs ago  . Drug use: No  . Sexual activity: Not Currently   Other Topics Concern  . Not on file   Social History Narrative   Lives alone   caffeine drinks 2 cups of coffee a day, occasional soda      Review of Systems: General: negative for chills, fever, night sweats or weight changes.  Cardiovascular: negative for chest pain, dyspnea on exertion, edema, orthopnea, palpitations, paroxysmal nocturnal dyspnea or shortness of breath Dermatological: negative for rash Respiratory: negative for cough or wheezing Urologic: negative for hematuria Abdominal: negative for nausea, vomiting, diarrhea, bright red blood per rectum, melena, or hematemesis Neurologic: negative for visual changes, syncope, or dizziness All other systems reviewed and are otherwise negative except as noted above.    Blood pressure (!) 110/50, pulse 76, height 4\' 11"  (1.499 m), weight 209 lb (94.8 kg).  General appearance: alert and no distress Neck: no adenopathy, no carotid bruit, no JVD, supple, symmetrical, trachea midline and thyroid not enlarged, symmetric, no tenderness/mass/nodules Lungs: clear to auscultation bilaterally Heart: regular rate and rhythm, S1, S2 normal, no murmur, click, rub or gallop Extremities: 1-2+ pitting edema bilaterally  EKG not performed today  ASSESSMENT AND PLAN:   Hypertension History of essential hypertension blood pressure 110/50.  Chronic diastolic heart failure (HCC) History of chronic diastolic heart failure with chronic lower extremity edema on torsemide 3 times a day. Her renal function is stable serum creatinine of 1.28. She does have residual 1-2+ pitting edema. She denies significant shortness of breath.      Lorretta Harp MD FACP,FACC,FAHA, [facility] 01/24/2017 11:41 AM

## 2017-01-25 MED FILL — TORSEMIDE 20 MG TABS: 20 | 90 days supply | Qty: 270 | Fill #0 | Status: TO

## 2017-01-25 MED FILL — TRESIBA FLEXTOUCH 200 UNITS: 200 | 30 days supply | Qty: 9 | Fill #0

## 2017-02-02 ENCOUNTER — Other Ambulatory Visit: Payer: Self-pay | Admitting: Internal Medicine

## 2017-02-02 ENCOUNTER — Telehealth: Payer: Self-pay | Admitting: Radiology

## 2017-02-02 DIAGNOSIS — R918 Other nonspecific abnormal finding of lung field: Secondary | ICD-10-CM

## 2017-02-02 NOTE — Telephone Encounter (Signed)
Called pt and gave her and her caregiver the instructions for the 13 hour prep. Prep called to [facility] on [address].

## 2017-02-03 ENCOUNTER — Encounter (HOSPITAL_COMMUNITY): Payer: Self-pay

## 2017-02-03 DIAGNOSIS — I5032 Chronic diastolic (congestive) heart failure: Secondary | ICD-10-CM | POA: Diagnosis not present

## 2017-02-03 DIAGNOSIS — J45909 Unspecified asthma, uncomplicated: Secondary | ICD-10-CM | POA: Insufficient documentation

## 2017-02-03 DIAGNOSIS — Z79899 Other long term (current) drug therapy: Secondary | ICD-10-CM | POA: Insufficient documentation

## 2017-02-03 DIAGNOSIS — Z7982 Long term (current) use of aspirin: Secondary | ICD-10-CM | POA: Diagnosis not present

## 2017-02-03 DIAGNOSIS — J069 Acute upper respiratory infection, unspecified: Secondary | ICD-10-CM | POA: Diagnosis not present

## 2017-02-03 DIAGNOSIS — E876 Hypokalemia: Secondary | ICD-10-CM | POA: Diagnosis not present

## 2017-02-03 DIAGNOSIS — Z794 Long term (current) use of insulin: Secondary | ICD-10-CM | POA: Diagnosis not present

## 2017-02-03 DIAGNOSIS — R05 Cough: Secondary | ICD-10-CM | POA: Diagnosis present

## 2017-02-03 DIAGNOSIS — E119 Type 2 diabetes mellitus without complications: Secondary | ICD-10-CM | POA: Insufficient documentation

## 2017-02-03 DIAGNOSIS — Z87891 Personal history of nicotine dependence: Secondary | ICD-10-CM | POA: Diagnosis not present

## 2017-02-03 DIAGNOSIS — I110 Hypertensive heart disease with heart failure: Secondary | ICD-10-CM | POA: Insufficient documentation

## 2017-02-03 DIAGNOSIS — E039 Hypothyroidism, unspecified: Secondary | ICD-10-CM | POA: Diagnosis not present

## 2017-02-03 NOTE — ED Triage Notes (Signed)
Pt BIB [facility]. She reports cough and general malaise. She was seen at her PCP yesterday and told she had "something in her chest and given prednisone, levofloxacin, and an inhaler." She is A&Ox4. Pt is blind.

## 2017-02-04 ENCOUNTER — Emergency Department (HOSPITAL_COMMUNITY): Payer: Medicare Other

## 2017-02-04 ENCOUNTER — Emergency Department (HOSPITAL_COMMUNITY)
Admission: EM | Admit: 2017-02-04 | Discharge: 2017-02-04 | Disposition: A | Discharge status: Home / Self Care | Payer: Medicare Other | Attending: Emergency Medicine | Admitting: Emergency Medicine

## 2017-02-04 DIAGNOSIS — J069 Acute upper respiratory infection, unspecified: Secondary | ICD-10-CM | POA: Diagnosis not present

## 2017-02-04 DIAGNOSIS — R05 Cough: Secondary | ICD-10-CM

## 2017-02-04 DIAGNOSIS — R059 Cough, unspecified: Secondary | ICD-10-CM

## 2017-02-04 DIAGNOSIS — E876 Hypokalemia: Secondary | ICD-10-CM

## 2017-02-04 DIAGNOSIS — R062 Wheezing: Secondary | ICD-10-CM

## 2017-02-04 LAB — CBG MONITORING, ED
GLUCOSE-CAPILLARY: 174 mg/dL — AB (ref 65–99)
Glucose-Capillary: 31 mg/dL — CL (ref 65–99)

## 2017-02-04 LAB — CBC
HCT: 34.9 % — ABNORMAL LOW (ref 36.0–46.0)
Hemoglobin: 11.4 g/dL — ABNORMAL LOW (ref 12.0–15.0)
MCH: 25.8 pg — AB (ref 26.0–34.0)
MCHC: 32.7 g/dL (ref 30.0–36.0)
MCV: 79 fL (ref 78.0–100.0)
PLATELETS: 166 10*3/uL (ref 150–400)
RBC: 4.42 MIL/uL (ref 3.87–5.11)
RDW: 15.5 % (ref 11.5–15.5)
WBC: 12 10*3/uL — ABNORMAL HIGH (ref 4.0–10.5)

## 2017-02-04 LAB — COMPREHENSIVE METABOLIC PANEL
ALBUMIN: 3 g/dL — AB (ref 3.5–5.0)
ALT: 18 U/L (ref 14–54)
ANION GAP: 10 (ref 5–15)
AST: 15 U/L (ref 15–41)
Alkaline Phosphatase: 114 U/L (ref 38–126)
BILIRUBIN TOTAL: 0.5 mg/dL (ref 0.3–1.2)
BUN: 22 mg/dL — AB (ref 6–20)
CHLORIDE: 103 mmol/L (ref 101–111)
CO2: 28 mmol/L (ref 22–32)
Calcium: 8.3 mg/dL — ABNORMAL LOW (ref 8.9–10.3)
Creatinine, Ser: 1.59 mg/dL — ABNORMAL HIGH (ref 0.44–1.00)
GFR calc Af Amer: 41 mL/min — ABNORMAL LOW (ref >60)
GFR calc non Af Amer: 35 mL/min — ABNORMAL LOW (ref >60)
GLUCOSE: 199 mg/dL — AB (ref 65–99)
POTASSIUM: 3.2 mmol/L — AB (ref 3.5–5.1)
SODIUM: 141 mmol/L (ref 135–145)
Total Protein: 7 g/dL (ref 6.5–8.1)

## 2017-02-04 LAB — I-STAT TROPONIN, ED: Troponin i, poc: 0 ng/mL (ref 0.00–0.08)

## 2017-02-04 LAB — BRAIN NATRIURETIC PEPTIDE: B Natriuretic Peptide: 30 pg/mL (ref 0.0–100.0)

## 2017-02-04 MED ORDER — SODIUM CHLORIDE 0.9 % IV BOLUS (SEPSIS)
500.0000 mL | Freq: Once | INTRAVENOUS | Status: AC
  Administered 2017-02-04: 500 mL via INTRAVENOUS

## 2017-02-04 MED ORDER — ALBUTEROL SULFATE (2.5 MG/3ML) 0.083% IN NEBU
5.0000 mg | INHALATION_SOLUTION | Freq: Once | RESPIRATORY_TRACT | Status: AC
  Administered 2017-02-04: 5 mg via RESPIRATORY_TRACT
  Filled 2017-02-04: qty 6

## 2017-02-04 MED ORDER — SODIUM CHLORIDE 0.9 % IV SOLN: INTRAVENOUS | Status: DC

## 2017-02-04 MED ORDER — POTASSIUM CHLORIDE CRYS ER 20 MEQ PO TBCR
40.0000 meq | EXTENDED_RELEASE_TABLET | Freq: Once | ORAL | Status: AC
  Administered 2017-02-04: 40 meq via ORAL
  Filled 2017-02-04: qty 2

## 2017-02-04 MED ORDER — DEXTROSE 50 % IV SOLN
25.0000 g | Freq: Once | INTRAVENOUS | Status: AC
  Administered 2017-02-04: 25 g via INTRAVENOUS

## 2017-02-04 MED ORDER — DEXTROSE 50 % IV SOLN
INTRAVENOUS | Status: AC
  Filled 2017-02-04: qty 50

## 2017-02-04 MED ORDER — IPRATROPIUM BROMIDE 0.02 % IN SOLN
0.5000 mg | Freq: Once | RESPIRATORY_TRACT | Status: AC
  Administered 2017-02-04: 0.5 mg via RESPIRATORY_TRACT
  Filled 2017-02-04: qty 2.5

## 2017-02-04 NOTE — ED Provider Notes (Signed)
Amy [name] DEPT Provider Note   CSN: [id number] Arrival date & time: 02/03/17  2123     History   Chief Complaint Chief Complaint  Patient presents with  . Cough    HPI Amy [name] is a 56 y.o. female.  Patient c/o non prod cough for the past several days. Episodic, persistent, recurrent. States went to pcp this week for same, was told something abnormal on cxr, and given prednisone, levaquin, and mdi. States symptoms persist. Denies acute or abrupt worsening tonight. No chest pain. Denies sore throat. No recent wt loss or night sweats. Denies abd pain. No nvd.    The history is provided by the patient.  Cough  Pertinent negatives include no chest pain, no chills, no headaches, no sore throat, no shortness of breath and no eye redness.    Past Medical History:  Diagnosis Date  . Asthma   . Blind   . Blindness and low vision    left eye glass eye,  legally blind in right eye  . Diabetes mellitus   . Diabetic neuropathy ([place])   . Hyperlipidemia   . Hypertension   . Hypothyroidism   . Spinal stenosis     Patient Active Problem List   Diagnosis Date Noted  . Chronic diastolic heart failure ([place]) 11/08/2016  . Near syncope 01/30/2016  . SOB (shortness of breath)   . CAP (community acquired pneumonia) 09/12/2015  . Hypoxia 09/12/2015  . Hypertension 07/05/2015  . Hypotension 07/05/2015  . Diabetes mellitus with neurological manifestations ([place]) 11/04/2014  . Hyperlipidemia LDL goal <70 11/04/2014  . Spinal stenosis, multilevel 11/04/2014  . Hyperkalemia 11/01/2014  . Hypoglycemia 08/09/2014  . DM (diabetes mellitus), type 2, uncontrolled, with hyperosmolarity ([place]) 08/08/2014  . Elevated troponin 08/08/2014  . Nausea vomiting and diarrhea 08/08/2014  . Dizziness 08/06/2012  . Orthostatic hypotension 08/06/2012  . Hypernatremia 08/06/2012  . Acute gastroenteritis 08/13/2011  . Gastroparesis 08/13/2011  . Chest pain 08/13/2011  . DM (diabetes mellitus)  ([place]) 08/13/2011  . Oral thrush 08/13/2011  . Gastroenteritis 05/23/2011  . Dehydration 05/23/2011  . Blindness 05/23/2011  . DM type 1, not at goal, causing eye disease ([place]) 05/23/2011  . Asthma 05/23/2011  . Diarrhea 05/23/2011  . Vomiting 05/23/2011  . Hypothyroidism 05/23/2011  . Diabetic neuropathy ([place]) 05/23/2011  . Diabetic nephropathy ([place]) 05/23/2011    Past Surgical History:  Procedure Laterality Date  . ABDOMINAL HYSTERECTOMY  1990  . [place]   x 2  . CHOLECYSTECTOMY  2008  . ENUCLEATION Bilateral 09/15/1998  . EYE SURGERY  2016   fitting artificial eye    OB History    Obstetric Comments   Pt has two sons.       Home Medications    Prior to Admission medications   Medication Sig Start Date End Date Taking? Authorizing Provider  acetaminophen (TYLENOL) 325 MG tablet Take 2 tablets (650 mg total) by mouth every 6 (six) hours as needed for mild pain (or Fever >/= 101). 02/01/16   Robbie Lis, MD  albuterol (PROVENTIL HFA;VENTOLIN HFA) 108 (90 BASE) MCG/ACT inhaler Inhale 2 puffs into the lungs every 4 (four) hours as needed for wheezing or shortness of breath. For short    [provider]  aspirin EC 81 MG tablet Take 81 mg by mouth daily.    [provider]  cetirizine (ZYRTEC) 10 MG tablet Take 10 mg by mouth daily.    [provider]  esomeprazole (  NEXIUM) 40 MG capsule Take 40 mg by mouth 2 (two) times daily before a meal.     [provider]  fluorometholone (FML FORTE) 0.25 % ophthalmic suspension Place 1 drop into the right eye 2 (two) times daily.    [provider]  fluticasone (FLONASE) 50 MCG/ACT nasal spray Place into both nostrils daily.    [provider]  insulin degludec (TRESIBA FLEXTOUCH) 100 UNIT/ML SOPN FlexTouch Pen Inject 60 Units into the skin daily at 10 pm.    [provider]  LEVEMIR FLEXTOUCH 100 UNIT/ML Pen Inject 45 Units into the skin every  evening. 09/21/15   Eugenie Filler, MD  Linaclotide [facility]) 145 MCG CAPS capsule Take 145 mcg by mouth daily as needed (constipation).     [provider]  LYRICA 150 MG capsule Take 150 mg by mouth 3 (three) times daily. 10/28/14   [provider]  predniSONE (DELTASONE) 20 MG tablet Take 1 tablet (20 mg total) by mouth 2 (two) times daily. 12/28/16   Daleen Bo, MD  spironolactone (ALDACTONE) 25 MG tablet Take 25 mg by mouth daily.    [provider]  torsemide (DEMADEX) 20 MG tablet Take 3 tablets every morning 01/13/17   Barrett, Evelene Croon, PA-C  traMADol (ULTRAM) 50 MG tablet Take 1 tablet (50 mg total) by mouth every 6 (six) hours as needed. 12/28/16   Daleen Bo, MD  triamcinolone cream (KENALOG) 0.1 % Apply 1 application topically 2 (two) times daily.    [provider]    Family History Family History  Problem Relation Age of Onset  . Diabetes Sister   . Glaucoma Sister   . Hypertension Sister   . Stroke Brother   . Heart attack Paternal Aunt     Social History Social History  Substance Use Topics  . Smoking status: Former Smoker    Types: Cigarettes    Quit date: 05/22/1992  . Smokeless tobacco: Never Used  . Alcohol use No     Comment: quit 20 yrs ago     Allergies   Penicillins; Iohexol; Codeine; Iodine-131; Lisinopril; and Sulfa antibiotics   Review of Systems Review of Systems  Constitutional: Negative for chills, fever and unexpected weight change.  HENT: Negative for sore throat.   Eyes: Negative for redness.  Respiratory: Positive for cough. Negative for shortness of breath.   Cardiovascular: Negative for chest pain and leg swelling.  Gastrointestinal: Negative for abdominal pain and vomiting.  Genitourinary: Negative for flank pain.  Musculoskeletal: Negative for back pain and neck pain.  Skin: Negative for rash.  Neurological: Negative for headaches.  Hematological: Does not bruise/bleed easily.    Psychiatric/Behavioral: Negative for confusion.     Physical Exam Updated Vital Signs BP 117/65 (BP Location: Left Arm)   Pulse 96   Temp 99 F (37.2 C) (Oral)   Resp 18   SpO2 95%   Physical Exam  Constitutional: She appears well-developed and well-nourished. No distress.  HENT:  Mouth/Throat: Oropharynx is clear and moist.  Eyes: Conjunctivae are normal. No scleral icterus.  Neck: Neck supple. No tracheal deviation present.  No stiffness/rigidity  Cardiovascular: Normal rate, regular rhythm, normal heart sounds and intact distal pulses.  Exam reveals no gallop and no friction rub.   No murmur heard. Pulmonary/Chest: Effort normal. No respiratory distress. She has wheezes.  Mild wheezing.   Abdominal: Soft. Normal appearance and bowel sounds are normal. She exhibits no distension. There is no tenderness.  Musculoskeletal: She exhibits  no edema or tenderness.  Neurological: She is alert.  Skin: Skin is warm and dry. No rash noted. She is not diaphoretic.  Psychiatric: She has a normal mood and affect.  Nursing note and vitals reviewed.    ED Treatments / Results  Labs (all labs ordered are listed, but only abnormal results are displayed) Results for orders placed or performed during the hospital encounter of 02/04/17  Comprehensive metabolic panel  Result Value Ref Range   Sodium 141 135 - 145 mmol/L   Potassium 3.2 (L) 3.5 - 5.1 mmol/L   Chloride 103 101 - 111 mmol/L   CO2 28 22 - 32 mmol/L   Glucose, Bld 199 (H) 65 - 99 mg/dL   BUN 22 (H) 6 - 20 mg/dL   Creatinine, Ser 1.59 (H) 0.44 - 1.00 mg/dL   Calcium 8.3 (L) 8.9 - 10.3 mg/dL   Total Protein 7.0 6.5 - 8.1 g/dL   Albumin 3.0 (L) 3.5 - 5.0 g/dL   AST 15 15 - 41 U/L   ALT 18 14 - 54 U/L   Alkaline Phosphatase 114 38 - 126 U/L   Total Bilirubin 0.5 0.3 - 1.2 mg/dL   GFR calc non Af Amer 35 (L) >60 mL/min   GFR calc Af Amer 41 (L) >60 mL/min   Anion gap 10 5 - 15  CBC  Result Value Ref Range   WBC 12.0 (H)  4.0 - 10.5 K/uL   RBC 4.42 3.87 - 5.11 MIL/uL   Hemoglobin 11.4 (L) 12.0 - 15.0 g/dL   HCT 34.9 (L) 36.0 - 46.0 %   MCV 79.0 78.0 - 100.0 fL   MCH 25.8 (L) 26.0 - 34.0 pg   MCHC 32.7 30.0 - 36.0 g/dL   RDW 15.5 11.5 - 15.5 %   Platelets 166 150 - 400 K/uL  Brain natriuretic peptide  Result Value Ref Range   B Natriuretic Peptide 30.0 0.0 - 100.0 pg/mL  POC CBG, ED  Result Value Ref Range   Glucose-Capillary 31 (LL) 65 - 99 mg/dL   Comment 1 Notify RN   I-stat troponin, ED  Result Value Ref Range   Troponin i, poc 0.00 0.00 - 0.08 ng/mL   Comment 3          POC CBG, ED  Result Value Ref Range   Glucose-Capillary 174 (H) 65 - 99 mg/dL   Dg Chest 2 View  Result Date: 02/04/2017 CLINICAL DATA: Cough, generally malaise. EXAM: CHEST  2 VIEW COMPARISON:  Chest radiograph October 17, 2016 FINDINGS: Cardiomediastinal silhouette is normal. Pulmonary vascular congestion. No pleural effusions or focal consolidations. Trachea projects midline and there is no pneumothorax. Soft tissue planes and included osseous structures are non-suspicious. Surgical clips in the included right abdomen compatible with cholecystectomy. IMPRESSION: Pulmonary vascular congestion. Electronically Signed   By: Elon Alas M.D.   On: 02/04/2017 02:13    EKG  EKG Interpretation None       Radiology Dg Chest 2 View  Result Date: 02/04/2017 CLINICAL DATA: Cough, generally malaise. EXAM: CHEST  2 VIEW COMPARISON:  Chest radiograph October 17, 2016 FINDINGS: Cardiomediastinal silhouette is normal. Pulmonary vascular congestion. No pleural effusions or focal consolidations. Trachea projects midline and there is no pneumothorax. Soft tissue planes and included osseous structures are non-suspicious. Surgical clips in the included right abdomen compatible with cholecystectomy. IMPRESSION: Pulmonary vascular congestion. Electronically Signed   By: Elon Alas M.D.   On: 02/04/2017 02:13    Procedures Procedures  (including  critical care time)  Medications Ordered in ED Medications - No data to display   Initial Impression / Assessment and Plan / ED Course  I have reviewed the triage vital signs and the nursing notes.  Pertinent labs & imaging results that were available during my care of the patient were reviewed by me and considered in my medical decision making (see chart for details).  Cxr.  Reviewed nursing notes and prior charts for additional history.   V mild aki on labs.  bnp is not elevated, 30.  Iv ns 500 cc.   Albuterol and atrovent neb.  Recheck, no increased wob. No wheezing.  Recently started on abx for cough.  rec pcp f/u Monday.  Currently no chest pain. No increased wob. Afeb.   K mildly low, kcl po.  Recheck, hr 70, rr 16, pulse ox 96% room air, no increased wob.    Pt to keep her outpt appt for CT Monday and pcp f/u then.   Final Clinical Impressions(s) / ED Diagnoses   Final diagnoses:  None    New Prescriptions New Prescriptions   No medications on file     Lajean Saver, MD 02/04/17 [phone]

## 2017-02-04 NOTE — Discharge Instructions (Signed)
It was our pleasure to provide your ER care today - we hope that you feel better.  Rest, drink adequate fluids.  Complete the course of your antibiotic. Use albuterol inhaler as need.  From todays lab tests, your potassium level is mildly low (3.2) - eat plenty of fruits and vegetables.  Also from todays lab, your kidney function is mildly increased from prior (creatinine 1.5). Follow up with your doctor in the next couple days.   Also follow up for the outpatient CT scan Monday as ordered by your doctor.   Follow up with your doctor after your CT scan for recheck.  Return to ER if worse, new symptoms, chest pain, increased trouble breathing, other concern.

## 2017-02-06 ENCOUNTER — Other Ambulatory Visit: Payer: Self-pay | Admitting: Internal Medicine

## 2017-02-06 ENCOUNTER — Other Ambulatory Visit: Payer: Medicare Other

## 2017-02-06 DIAGNOSIS — R918 Other nonspecific abnormal finding of lung field: Secondary | ICD-10-CM

## 2017-02-08 NOTE — Progress Notes (Addendum)
[facility]  [name] DOB: 1961-04-16   REASON FOR VISIT: Follow-up for obstructive sleep apnea on CPAP HISTORY FROM: patient and caregiver    HISTORY OF PRESENT ILLNESS:Dr. Dorthea Cove. [name] is a 56 year old right-handed woman with an underlying medical history of diabetes, blindness (with L prosthetic eye and R eye blindness d/t diabetes), hyperlipidemia, asthma, arthritis, spinal stenosis, thyroid disease, nephropathy, orthostatic hypotension deemed secondary to diabetic autonomic neuropathy (seen by Dr. Leta [name] in 9/17), and morbid obesity, who presents for follow up consultation of her sleep apnea, after recent sleep study testing. The patient is unaccompanied today. I first met her on 09/22/16, at the request of her PCP, at which time she reported snoring and excessive daytime somnolence. I invited her for a sleep study. She had a baseline sleep study, followed by a CPAP titration study. I went over her test results with her in detail today. Baseline sleep study from 10/02/2016 showed a sleep efficiency of 84.8%, sleep latency of 18.5 minutes, REM latency of 72.5 minutes, she had absence of slow-wave sleep, an increased percentage of stage II sleep, REM sleep at 16.8%. Total AHI was 6.8 per hour, REM AHI 28 per hour, supine sleep was nearly absent. Average oxygen saturation was 95%, nadir was 78%, time below 89% saturation of 24 minutes. Based on her sleep-related complaints and significant desaturations noted during REM sleep I suggested she return for a full night CPAP titration study. She had this on 10/18/2016, sleep efficiency was 85.4%, sleep latency of 34 minutes, REM latency was 194 minutes. She had slow-wave sleep at 7.8%, REM sleep at 12.1%. She was fitted with medium nasal pillows and CPAP was titrated from 5 cm to 9 cm. On the final pressure her AHI was 0 per hour, nonsupine REM sleep was achieved and alternated was 86%. I suggested a home CPAP  pressure of 10 cm. 12/29/16 Dr. Rexene [name] I reviewed her CPAP compliance data from 11/28/16 to 12/27/16, which is a total of 30 days, during which time she used her CPAP 26 days, with percent used days greater than 4 hours of 63%, indicating suboptimal compliance, average usage of 5 hours and 27 minutes, average AHI of 3/hour, leak low with the 95th percentile of 2.8 lpm on a pressure of 10 cm with EPR of 3 UPDATE  09/27/2018[name], 56 year old  Female returns for follow-up with a history of obstructive sleep apnea on CPAP Compliance data dated 01/10/2017 through 02/08/2017 shows greater than 4 hours at 90% for 27  Days. Average usage 6 hours 37  Minutes.  10 cm of pressure. EPR 3. AHI 2.9AHI 2.9. Leak low  with the 95th percentile.  She reports less daytime drowiness. She has a new caregiver  who requires education on cleaning and handling the CPAP equipment. She returns for reevaluation REVIEW OF SYSTEMS: Full 14 system review of systems performed and notable only for those listed, all others are neg:  Constitutional: neg  Cardiovascular: neg Ear/Nose/Throat: neg  Skin: neg Eyes:  blind Respiratory: neg Gastroitestinal: neg  Hematology/Lymphatic: neg  Endocrine: neg Musculoskeletal:neg Allergy/Immunology: neg Neurological: neg Psychiatric: neg Sleep :  Obstructive sleep pnea on CpAP   ALLERGIES: Allergies  Allergen Reactions  . Penicillins Hives and Swelling    Has patient had a PCN reaction causing immediate rash, facial/tongue/throat swelling, SOB or lightheadedness with hypotension: yes- face swelling Has patient had a PCN reaction causing severe rash involving mucus membranes or skin necrosis: no Has patient had a  PCN reaction that required hospitalization unknown (childhood allergy) Has patient had a PCN reaction occurring within the last 10 years: no If all of the above answers are "NO", then may proceed with Cephalosporin use.   . Iohexol Hives    pt developed itching and  hives along with nasal congestion; needs 13 hour premeds for future studies, Onset Date: [id number]   . Codeine Nausea Only  . Iodine-131 Hives    hives  . Lisinopril Cough  . Sulfa Antibiotics Nausea And Vomiting    HOME MEDICATIONS: Outpatient Medications Prior to Visit  Medication Sig Dispense Refill  . acetaminophen (TYLENOL) 325 MG tablet Take 2 tablets (650 mg total) by mouth every 6 (six) hours as needed for mild pain (or Fever >/= 101). 30 tablet 0  . albuterol (PROVENTIL HFA;VENTOLIN HFA) 108 (90 BASE) MCG/ACT inhaler Inhale 2 puffs into the lungs every 4 (four) hours as needed for wheezing or shortness of breath. For short    . aspirin EC 81 MG tablet Take 81 mg by mouth daily.    . cetirizine (ZYRTEC) 10 MG tablet Take 10 mg by mouth daily.    Marland Kitchen esomeprazole (NEXIUM) 40 MG capsule Take 40 mg by mouth 2 (two) times daily before a meal.     . fluorometholone (FML FORTE) 0.25 % ophthalmic suspension Place 1 drop into the right eye 2 (two) times daily.    . fluticasone (FLONASE) 50 MCG/ACT nasal spray Place into both nostrils daily.    . insulin degludec (TRESIBA FLEXTOUCH) 100 UNIT/ML SOPN FlexTouch Pen Inject 60 Units into the skin daily at 10 pm.    . Linaclotide (LINZESS) 145 MCG CAPS capsule Take 145 mcg by mouth daily as needed (constipation).     Marland Kitchen LYRICA 150 MG capsule Take 150 mg by mouth 3 (three) times daily.    . predniSONE (DELTASONE) 20 MG tablet Take 1 tablet (20 mg total) by mouth 2 (two) times daily. 10 tablet 0  . spironolactone (ALDACTONE) 25 MG tablet Take 25 mg by mouth daily.    Marland Kitchen torsemide (DEMADEX) 20 MG tablet Take 3 tablets every morning 270 tablet 3  . traMADol (ULTRAM) 50 MG tablet Take 1 tablet (50 mg total) by mouth every 6 (six) hours as needed. 15 tablet 0  . triamcinolone cream (KENALOG) 0.1 % Apply 1 application topically 2 (two) times daily.    Marland Kitchen LEVEMIR FLEXTOUCH 100 UNIT/ML Pen Inject 45 Units into the skin every evening. 15 mL 0   No  facility-administered medications prior to visit.     PAST MEDICAL HISTORY: Past Medical History:  Diagnosis Date  . Asthma   . Blind   . Blindness and low vision    left eye glass eye,  legally blind in right eye  . Diabetes mellitus   . Diabetic neuropathy ([place])   . Hyperlipidemia   . Hypertension   . Hypothyroidism   . Spinal stenosis     PAST SURGICAL HISTORY: Past Surgical History:  Procedure Laterality Date  . ABDOMINAL HYSTERECTOMY  1990  . [place]   x 2  . CHOLECYSTECTOMY  2008  . ENUCLEATION Bilateral 09/15/1998  . EYE SURGERY  2016   fitting artificial eye    FAMILY HISTORY: Family History  Problem Relation Age of Onset  . Diabetes Sister   . Glaucoma Sister   . Hypertension Sister   . Stroke Brother   . Heart attack Paternal Aunt     SOCIAL HISTORY:  Social History   Social History  . Marital status: Single    Spouse name: N/A  . Number of children: 2  . Years of education: 12   Occupational History  . N/A     disabled   Social History Main Topics  . Smoking status: Former Smoker    Types: Cigarettes    Quit date: 05/22/1992  . Smokeless tobacco: Never Used  . Alcohol use No     Comment: quit 20 yrs ago  . Drug use: No  . Sexual activity: Not Currently   Other Topics Concern  . Not on file   Social History Narrative   Lives alone   caffeine drinks 2 cups of coffee a day, occasional soda      PHYSICAL EXAM  Vitals:   02/09/17 0805  BP: 124/80  Pulse: 64  Weight: 210 lb 9.6 oz (95.5 kg)   Body mass index is 42.54 kg/m.  Generalized: Well developed, in no acute distress ,  Well-groomed Head: normocephalic and atraumatic,. Oropharynx benign  Neck: Supple,  Musculoskeletal: No deformity   Neurological examination   Mentation: Alert oriented to time, place, history taking. Attention span and concentration appropriate. Recent and remote memory intact.  Follows all commands speech and language fluent.    Cranial nerve II-XII: R Pupils was round not reactive to light ,  Left eye prosthesis. extraocular movements not possible to track. Facial sensation and strength were normal. hearing was intact to finger rubbing bilaterally. Uvula tongue midline. head turning and shoulder shrug were normal and symmetric.Tongue protrusion into cheek strength was normal. Motor: normal bulk and tone, full strength in the BUE, BLE, Sensory: normal and symmetric to light touch, face arms and legs Coordination: finger-nose-finger, heel-to-shin bilaterally, no dysmetria Reflexes:  1+ in the upper extremities,absent in the lower extremities plantar responses were flexor bilaterally. Gait and Station: Rising up from seated position without assistance, she ambulates with her blind stick and walks with her aide. DIAGNOSTIC DATA (LABS, IMAGING, TESTING) - I reviewed patient records, labs, notes, testing and imaging myself where available.  Lab Results  Component Value Date   WBC 12.0 (H) 02/04/2017   HGB 11.4 (L) 02/04/2017   HCT 34.9 (L) 02/04/2017   MCV 79.0 02/04/2017   PLT 166 02/04/2017      Component Value Date/Time   NA 141 02/04/2017 0243   NA 145 (H) 01/13/2017 1213   K 3.2 (L) 02/04/2017 0243   CL 103 02/04/2017 0243   CO2 28 02/04/2017 0243   GLUCOSE 199 (H) 02/04/2017 0243   BUN 22 (H) 02/04/2017 0243   BUN 29 (H) 01/13/2017 1213   CREATININE 1.59 (H) 02/04/2017 0243   CREATININE 0.92 10/22/2012 1351   CALCIUM 8.3 (L) 02/04/2017 0243   PROT 7.0 02/04/2017 0243   ALBUMIN 3.0 (L) 02/04/2017 0243   AST 15 02/04/2017 0243   ALT 18 02/04/2017 0243   ALKPHOS 114 02/04/2017 0243   BILITOT 0.5 02/04/2017 0243   GFRNONAA 35 (L) 02/04/2017 0243   GFRAA 41 (L) 02/04/2017 0243   Lab Results  Component Value Date   CHOL 170 10/22/2012   HDL 56 10/22/2012   LDLCALC 92 10/22/2012   TRIG 112 10/22/2012   CHOLHDL 3.0 10/22/2012   Lab Results  Component Value Date   HGBA1C 9.9 (H) 01/31/2016    Lab Results  Component Value Date   VITAMINB12 325 05/25/2011   Lab Results  Component Value Date   TSH 2.285 01/31/2016  ASSESSMENT AND PLAN 56 year old female with an underlying medical history of diabetes, blindness (with L prosthetic eye and R eye blindness d/t diabetes), hyperlipidemia, asthma, arthritis, spinal stenosis, thyroid disease, nephropathy, orthostatic hypotension deemed secondary to diabetic autonomic neuropathy (for which she saw Dr. Leta [name] in 9/17), and morbid obesity, who presents for follow-up consultation of her obstructive sleep apnea, her baseline sleep study in May 2018 indicated overall mild OSA but more significant REM related sleep apnea with significant desaturations during REM sleep. She returned in June 2018 for a titration study and has done well with CPAP of 10 cm. CPAP Compliance data dated 01/10/2017 through 02/08/2017 shows greater than 4 hours at 90% for 27  Days. Average usage 6 hours 37  Minutes.  10 cm of pressure. EPR 3. AHI 2.9AHI 2.9. Leak low  with the 95th percentile.   She has noted improvement in her sleep quality and daytime somnolence  PLAN: CPAP  Compliance at 90%  Greater  Than 4 hours reviewed with patient and caregiver # for [employer] [phone] call for problems with machine reeducation on cleaning equipment, and maintenance by caregiver F/U in 6 months for repeat compliance reading, then yearly. Dennie Bible, [facility], [facility], APRN  [facility] [address] [place], [place] [address] [phone]  I reviewed the above note and documentation by the Nurse Practitioner and agree with the history, physical exam, assessment and plan as outlined above. I was immediately available for face-to-face consultation. [name], MD, PhD [facility])

## 2017-02-09 ENCOUNTER — Encounter: Payer: Self-pay | Admitting: Nurse Practitioner

## 2017-02-09 ENCOUNTER — Ambulatory Visit (INDEPENDENT_AMBULATORY_CARE_PROVIDER_SITE_OTHER): Payer: Medicare Other | Admitting: Nurse Practitioner

## 2017-02-09 DIAGNOSIS — Z9989 Dependence on other enabling machines and devices: Secondary | ICD-10-CM

## 2017-02-09 DIAGNOSIS — G4733 Obstructive sleep apnea (adult) (pediatric): Secondary | ICD-10-CM | POA: Diagnosis not present

## 2017-02-09 NOTE — Patient Instructions (Signed)
CPAP  Compliance at 90%  Greater  than4 hours # for [employer] [phone] call for problems with machine F/U in 6 months for compliance reading

## 2017-02-13 ENCOUNTER — Ambulatory Visit
Admission: RE | Admit: 2017-02-13 | Discharge: 2017-02-13 | Disposition: A | Discharge status: Home / Self Care | Payer: Medicare Other | Source: Ambulatory Visit | Attending: Internal Medicine | Admitting: Internal Medicine

## 2017-02-13 DIAGNOSIS — R918 Other nonspecific abnormal finding of lung field: Secondary | ICD-10-CM

## 2017-03-08 DIAGNOSIS — H02106 Unspecified ectropion of left eye, unspecified eyelid: Secondary | ICD-10-CM | POA: Insufficient documentation

## 2017-03-08 DIAGNOSIS — Q111 Other anophthalmos: Secondary | ICD-10-CM | POA: Insufficient documentation

## 2017-03-08 DIAGNOSIS — T85321A 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, initial encounter: Secondary | ICD-10-CM | POA: Insufficient documentation

## 2017-03-14 ENCOUNTER — Encounter (HOSPITAL_COMMUNITY): Payer: Self-pay | Admitting: Emergency Medicine

## 2017-03-14 ENCOUNTER — Ambulatory Visit (HOSPITAL_COMMUNITY)
Admission: EM | Admit: 2017-03-14 | Discharge: 2017-03-14 | Disposition: A | Discharge status: Home / Self Care | Payer: Medicare Other | Attending: Family Medicine | Admitting: Family Medicine

## 2017-03-14 DIAGNOSIS — M25512 Pain in left shoulder: Secondary | ICD-10-CM | POA: Diagnosis not present

## 2017-03-14 MED ORDER — DICLOFENAC SODIUM 1 % TD CREA: 1.0000 | TOPICAL_CREAM | Freq: Three times a day (TID) | TRANSDERMAL | 0 refills | Status: DC | PRN

## 2017-03-14 NOTE — ED Provider Notes (Signed)
Amy [name]    CSN: [id number] Arrival date & time: 03/14/17  1418     History   Chief Complaint Chief Complaint  Patient presents with  . Shoulder Pain    HPI Amy [name] is a 56 y.o. female.   Amy [name] presents with complaints of left shoulder pain which has been ongoing for the past 3 weeks. Worse with raising her arm. Has tried tylenol and pain patches which have minimally helped. No known injury, repetitive use or fall. She is right handed. She has not had a previous shoulder injury. Denies numbness or tingling. Rates her pain 8/10. The pain can radiate down her arm at times. She has a history of spinal stenosis and states she has chronic neck pain. She has a history of diabetes, last creatinine 1.59 01/2017.   ROS per HPI.       Past Medical History:  Diagnosis Date  . Asthma   . Blind   . Blindness and low vision    left eye glass eye,  legally blind in right eye  . Diabetes mellitus   . Diabetic neuropathy ([place])   . Hyperlipidemia   . Hypertension   . Hypothyroidism   . Spinal stenosis     Patient Active Problem List   Diagnosis Date Noted  . Obstructive sleep apnea on CPAP 02/09/2017  . Chronic diastolic heart failure ([place]) 11/08/2016  . Near syncope 01/30/2016  . SOB (shortness of breath)   . CAP (community acquired pneumonia) 09/12/2015  . Hypoxia 09/12/2015  . Hypertension 07/05/2015  . Hypotension 07/05/2015  . Diabetes mellitus with neurological manifestations ([name]) 11/04/2014  . Hyperlipidemia LDL goal <70 11/04/2014  . Spinal stenosis, multilevel 11/04/2014  . Hyperkalemia 11/01/2014  . Hypoglycemia 08/09/2014  . DM (diabetes mellitus), type 2, uncontrolled, with hyperosmolarity ([place]) 08/08/2014  . Elevated troponin 08/08/2014  . Nausea vomiting and diarrhea 08/08/2014  . Dizziness 08/06/2012  . Orthostatic hypotension 08/06/2012  . Hypernatremia 08/06/2012  . Acute gastroenteritis 08/13/2011  . Gastroparesis 08/13/2011  .  Chest pain 08/13/2011  . DM (diabetes mellitus) ([place]) 08/13/2011  . Oral thrush 08/13/2011  . Gastroenteritis 05/23/2011  . Dehydration 05/23/2011  . Blindness 05/23/2011  . DM type 1, not at goal, causing eye disease ([place]) 05/23/2011  . Asthma 05/23/2011  . Diarrhea 05/23/2011  . Vomiting 05/23/2011  . Hypothyroidism 05/23/2011  . Diabetic neuropathy ([place]) 05/23/2011  . Diabetic nephropathy ([place]) 05/23/2011    Past Surgical History:  Procedure Laterality Date  . ABDOMINAL HYSTERECTOMY  1990  . [place]   x 2  . CHOLECYSTECTOMY  2008  . ENUCLEATION Bilateral 09/15/1998  . EYE SURGERY  2016   fitting artificial eye    OB History    Obstetric Comments   Pt has two sons.       Home Medications    Prior to Admission medications   Medication Sig Start Date End Date Taking? Authorizing Provider  levothyroxine (SYNTHROID, LEVOTHROID) 150 MCG tablet Take 150 mcg by mouth daily before breakfast.   Yes [provider]  acetaminophen (TYLENOL) 325 MG tablet Take 2 tablets (650 mg total) by mouth every 6 (six) hours as needed for mild pain (or Fever >/= 101). 02/01/16   Robbie Lis, MD  albuterol (PROVENTIL HFA;VENTOLIN HFA) 108 (90 BASE) MCG/ACT inhaler Inhale 2 puffs into the lungs every 4 (four) hours as needed for wheezing or shortness of breath. For short    [provider]  aspirin EC 81 MG tablet Take 81 mg by mouth daily.    [provider]  cetirizine (ZYRTEC) 10 MG tablet Take 10 mg by mouth daily.    [provider]  cyclobenzaprine (FLEXERIL) 10 MG tablet 10 mg as needed. 01/26/17   [provider]  Diclofenac Sodium 1 % CREA Place 1 Squirt onto the skin 3 (three) times daily as needed. Apply to left shoulder 03/14/17   Augusto Gamble B, NP  esomeprazole (NEXIUM) 40 MG capsule Take 40 mg by mouth 2 (two) times daily before a meal.     [provider]  fluorometholone (FML FORTE) 0.25 % ophthalmic  suspension Place 1 drop into the right eye 2 (two) times daily.    [provider]  fluticasone (FLONASE) 50 MCG/ACT nasal spray Place into both nostrils daily.    [provider]  Cleda Clarks 100 UNIT/ML [name]  01/24/17   [provider]  insulin degludec (TRESIBA FLEXTOUCH) 100 UNIT/ML SOPN FlexTouch Pen Inject 60 Units into the skin daily at 10 pm.    [provider]  Linaclotide (LINZESS) 145 MCG CAPS capsule Take 145 mcg by mouth daily as needed (constipation).     [provider]  LYRICA 150 MG capsule Take 150 mg by mouth 3 (three) times daily. 10/28/14   [provider]  ondansetron (ZOFRAN) 4 MG tablet 4 mg. As needed 02/06/17   [provider]  predniSONE (DELTASONE) 20 MG tablet Take 1 tablet (20 mg total) by mouth 2 (two) times daily. 12/28/16   Daleen Bo, MD  spironolactone (ALDACTONE) 25 MG tablet Take 25 mg by mouth daily.    [provider]  torsemide (DEMADEX) 20 MG tablet Take 3 tablets every morning 01/13/17   Barrett, Evelene Croon, PA-C  traMADol (ULTRAM) 50 MG tablet Take 1 tablet (50 mg total) by mouth every 6 (six) hours as needed. 12/28/16   Daleen Bo, MD  triamcinolone cream (KENALOG) 0.1 % Apply 1 application topically 2 (two) times daily.    [provider]  UNKNOWN TO PATIENT Diabetic ned, takes 60 units at bedtime    [provider]    Family History Family History  Problem Relation Age of Onset  . Diabetes Sister   . Glaucoma Sister   . Hypertension Sister   . Stroke Brother   . Heart attack Paternal Aunt     Social History Social History  Substance Use Topics  . Smoking status: Former Smoker    Types: Cigarettes    Quit date: 05/22/1992  . Smokeless tobacco: Never Used  . Alcohol use No     Comment: quit 20 yrs ago     Allergies   Penicillins; Iohexol; Codeine; Iodine-131; Lisinopril; and Sulfa antibiotics   Review of Systems Review of  Systems   Physical Exam Triage Vital Signs ED Triage Vitals  Enc Vitals Group     BP 03/14/17 1450 (!) 110/55     Pulse Rate 03/14/17 1450 68     Resp 03/14/17 1450 (!) 24     Temp 03/14/17 1450 98.1 F (36.7 C)     Temp Source 03/14/17 1450 Oral     SpO2 03/14/17 1450 100 %     Weight --      Height --      Head Circumference --      Peak Flow --      Pain Score 03/14/17 1444 8     Pain Loc --  Pain Edu? --      Excl. in [place]? --    No data found.   Updated Vital Signs BP (!) 110/55 (BP Location: Right Arm)   Pulse 68   Temp 98.1 F (36.7 C) (Oral)   Resp (!) 24   SpO2 100%   Visual Acuity Right Eye Distance:   Left Eye Distance:   Bilateral Distance:    Right Eye Near:   Left Eye Near:    Bilateral Near:     Physical Exam  Constitutional: She is oriented to person, place, and time. She appears well-developed and well-nourished. No distress.  Cardiovascular: Normal rate and regular rhythm.   Pulmonary/Chest: Effort normal. She exhibits no tenderness.  Musculoskeletal:       Left shoulder: She exhibits decreased range of motion, tenderness and pain. She exhibits no bony tenderness, no swelling, no effusion, no crepitus, no deformity, no spasm, normal pulse and normal strength.  Pain with raising left arm to shoulder level and with abduction; generalized tenderness on palpation without specific point tenderness. pain with external rotation; grip and strength in front of body equal to bilateral arms. Strong radial pulses, equal bilaterally; sensation intact  Neurological: She is alert and oriented to person, place, and time. She displays normal reflexes. No sensory deficit. Coordination normal.  Skin: Skin is warm and dry.     UC Treatments / Results  Labs (all labs ordered are listed, but only abnormal results are displayed) Labs Reviewed - No data to display  EKG  EKG Interpretation None       Radiology No results found.  Procedures Procedures  (including critical care time)  Medications Ordered in UC Medications - No data to display   Initial Impression / Assessment and Plan / UC Course  I have reviewed the triage vital signs and the nursing notes.  Pertinent labs & imaging results that were available during my care of the patient were reviewed by me and considered in my medical decision making (see chart for details).     Without injury or specific bony tenderness, imaging deferred at this time. Discussed arthritis vs strain to left shoulder. Light activity and stretches recommended. Based on creatinine and diabetes status avoided nsaids and steroids today, will try topical diclofenac. Tylenol as needed. Ice application. If symptoms worsen or do not improve in the next week to return to be seen or to follow up with PCP. Patient verbalized understanding and agreeable to plan.   Zigmund Gottron, NP 03/14/2017 3:19 PM   Final Clinical Impressions(s) / UC Diagnoses   Final diagnoses:  Left shoulder pain, unspecified chronicity    New Prescriptions New Prescriptions   DICLOFENAC SODIUM 1 % CREA    Place 1 Squirt onto the skin 3 (three) times daily as needed. Apply to left shoulder     Controlled Substance Prescriptions [place] Controlled Substance Registry consulted? Not Applicable   Zigmund Gottron, NP 03/14/17 [phone]

## 2017-03-14 NOTE — ED Triage Notes (Addendum)
Left shoulder pain for 3 weeks.  Pain all the time.  Pain is sharp, shooting in upper left arm.  No fall.  If patient lifts left arm at her side, pain starts on upper arm

## 2017-03-17 ENCOUNTER — Ambulatory Visit (INDEPENDENT_AMBULATORY_CARE_PROVIDER_SITE_OTHER): Payer: Medicare Other | Admitting: Podiatry

## 2017-03-17 DIAGNOSIS — M79676 Pain in unspecified toe(s): Secondary | ICD-10-CM

## 2017-03-17 DIAGNOSIS — B351 Tinea unguium: Secondary | ICD-10-CM

## 2017-03-20 NOTE — Progress Notes (Signed)
Subjective: 56 y.o. returns the office today for painful, elongated, thickened toenails which she cannot trim herself.  Denies any redness or drainage around the nails.  Denies any acute changes since last appointment and no new complaints today. Denies any systemic complaints such as fevers, chills, nausea, vomiting.   Objective: AAO 3, NAD DP/PT pulses palpable, CRT less than 3 seconds Nails hypertrophic, dystrophic, elongated, brittle, discolored 10. There is tenderness overlying the nails 1-5 bilaterally. There is no surrounding erythema or drainage along the nail sites. No open lesions or pre-ulcerative lesions are identified. On the plantar aspect the left foot on the medial band plantar fascia there is a well-defined fine non-mobile soft tissue mass consistent with a plantar fibroma. Np tenderness to palpation to the area. Area appears smaller No other areas of tenderness. No other areas of tenderness bilateral lower extremities. No overlying edema, erythema, increased warmth. No pain with calf compression, swelling, warmth, erythema.  Assessment: Patient presents with symptomatic onychomycosis; plantar fibroma  Plan: -Treatment options including alternatives, risks, complications were discussed -Nails sharply debrided 10 without complication/bleeding. -Continue with stretching exercises daily as well as massage to help with this. Also ice to the area. -Discussed daily foot inspection. If there are any changes, to call the office immediately.  -Follow-up in 3 months or sooner if any problems are to arise. In the meantime, encouraged to call the office with any questions, concerns, changes symptoms.  Celesta Gentile, DPM

## 2017-03-24 ENCOUNTER — Ambulatory Visit: Payer: Medicare Other | Admitting: Podiatry

## 2017-04-03 ENCOUNTER — Telehealth: Payer: Self-pay | Admitting: Cardiovascular Disease

## 2017-04-03 NOTE — Telephone Encounter (Signed)
Spoke with pt, she reports swelling in her legs and abdomen. She reports her weight being up from 212 lbs on Saturday to 222 lbs today.she reports some SOB with movement from the bathroom to the kitchen and she has had trouble using her CPAP the last couple nights because she feels SOB. She has recently taken the 3 tablets of torsemide for today. She was seen by her medical doctor today and he told her to call us about the fluid. Patient will come in tomorrow morning for appt to see dr berry.

## 2017-04-03 NOTE — Telephone Encounter (Signed)
Please call,pt says she is maintaining a lot of fluid.

## 2017-04-04 ENCOUNTER — Ambulatory Visit (INDEPENDENT_AMBULATORY_CARE_PROVIDER_SITE_OTHER): Payer: Medicare Other | Admitting: Cardiovascular Disease

## 2017-04-04 ENCOUNTER — Encounter: Payer: Self-pay | Admitting: Cardiovascular Disease

## 2017-04-04 VITALS — BP 147/83 | HR 81 | Ht 59.0 in | Wt 221.4 lb

## 2017-04-04 DIAGNOSIS — I5032 Chronic diastolic (congestive) heart failure: Secondary | ICD-10-CM

## 2017-04-04 DIAGNOSIS — G4733 Obstructive sleep apnea (adult) (pediatric): Secondary | ICD-10-CM | POA: Diagnosis not present

## 2017-04-04 DIAGNOSIS — Z9989 Dependence on other enabling machines and devices: Secondary | ICD-10-CM | POA: Diagnosis not present

## 2017-04-04 DIAGNOSIS — E785 Hyperlipidemia, unspecified: Secondary | ICD-10-CM

## 2017-04-04 DIAGNOSIS — I10 Essential (primary) hypertension: Secondary | ICD-10-CM

## 2017-04-04 LAB — BASIC METABOLIC PANEL
BUN/Creatinine Ratio: 23 (ref 9–23)
BUN: 27 mg/dL — AB (ref 6–24)
CALCIUM: 8.9 mg/dL (ref 8.7–10.2)
CO2: 30 mmol/L — AB (ref 20–29)
CREATININE: 1.17 mg/dL — AB (ref 0.57–1.00)
Chloride: 105 mmol/L (ref 96–106)
GFR calc Af Amer: 59 mL/min/{1.73_m2} — ABNORMAL LOW (ref >59)
GFR, EST NON AFRICAN AMERICAN: 51 mL/min/{1.73_m2} — AB (ref >59)
GLUCOSE: 97 mg/dL (ref 65–99)
Potassium: 3.6 mmol/L (ref 3.5–5.2)
Sodium: 149 mmol/L — ABNORMAL HIGH (ref 134–144)

## 2017-04-04 MED ORDER — METOLAZONE 5 MG PO TABS
5.0000 mg | ORAL_TABLET | ORAL | 2 refills | Status: DC
Start: 2017-04-04 — End: 2017-04-17

## 2017-04-04 MED ORDER — METOLAZONE 5 MG PO TABS: 5.0000 mg | ORAL_TABLET | Freq: Every day | ORAL | 3 refills | Status: DC

## 2017-04-04 NOTE — Assessment & Plan Note (Signed)
History of essential hypertension blood pressure measured 147/83. She is not on any hypertensive medications.

## 2017-04-04 NOTE — Patient Instructions (Signed)
Medication Instructions: Your physician recommends that you continue on your current medications as directed. Please refer to the Current Medication list given to you today.  START Metolazone (Zaroxolyn) 5 mg every other day 30 min prior to your morning dose of Torsemide.  Labwork: Your physician recommends that you return for lab work today.   Follow-Up: We request that you follow-up in: 3 months with an extender and in 6 months with Dr Andria Rhein will receive a reminder letter in the mail two months in advance. If you don't receive a letter, please call our office to schedule the follow-up appointment.  You have been referred to the Heart Failure Clinic. Please schedule an appointment to see them in 1 week.  If you need a refill on your cardiac medications before your next appointment, please call your pharmacy.

## 2017-04-04 NOTE — Progress Notes (Signed)
04/04/2017 [name]   May 18, 1960  [id number]  Primary Physician Velna Hatchet, MD Primary Cardiologist: Lorretta Harp MD [name], [place], [place]  HPI:  Amy [name] is a 56 y.o.  moderately overweight single African-American female mother of 2 children referred by Dr. Ardeth Perfect for cardiovascular evaluation because of weight gain and edema. I last saw her in the office 01/24/17 . She had previously seen Dr Nadyne Coombes nfor cardiovascular treatment. She has a history of essential hypertension, diabetes and hyperlipidemia. She has obstructive sleep apnea on sleep As well as diastolic heart failure. She had a negative Myoview 10/10/14 and a 2-D echo performed 07/06/15 that revealed normal LV function and grade 2 diastolic dysfunction. She does have diabetes with complications including blindness for the last 18 years and peripheral neuropathy. She says that she does not eat salt. She's gained 10 or 12 pounds in the last 6 months has had 2 ER visits for lower extremity edema requiring supplemental furosemide. I did change her diuretics to torsemide TID . Her renal function is fairly stable. She continues to have 1-2+ pitting edema. Since I saw her in the office 2 months ago her weight has gone up 12 pounds despite being on 3 times a day torsemide. She says that she avoid salt although she eats a lot of processed foods. She does admit to dyspnea on exertion.    Current Meds  Medication Sig  . acetaminophen (TYLENOL) 325 MG tablet Take 2 tablets (650 mg total) by mouth every 6 (six) hours as needed for mild pain (or Fever >/= 101).  Marland Kitchen albuterol (PROVENTIL HFA;VENTOLIN HFA) 108 (90 BASE) MCG/ACT inhaler Inhale 2 puffs into the lungs every 4 (four) hours as needed for wheezing or shortness of breath. For short  . aspirin EC 81 MG tablet Take 81 mg by mouth daily.  . cetirizine (ZYRTEC) 10 MG tablet Take 10 mg by mouth daily.  . cyclobenzaprine (FLEXERIL) 10 MG tablet 10 mg as needed.  .  Diclofenac Sodium 1 % CREA Place 1 Squirt onto the skin 3 (three) times daily as needed. Apply to left shoulder  . esomeprazole (NEXIUM) 40 MG capsule Take 40 mg by mouth 2 (two) times daily before a meal.   . fluorometholone (FML FORTE) 0.25 % ophthalmic suspension Place 1 drop into the right eye 2 (two) times daily.  . fluticasone (FLONASE) 50 MCG/ACT nasal spray Place into both nostrils daily.  Marland Kitchen HUMALOG KWIKPEN 100 UNIT/ML KiwkPen   . insulin degludec (TRESIBA FLEXTOUCH) 100 UNIT/ML SOPN FlexTouch Pen Inject 60 Units into the skin daily at 10 pm.  . levothyroxine (SYNTHROID, LEVOTHROID) 150 MCG tablet Take 150 mcg by mouth daily before breakfast.  . Linaclotide (LINZESS) 145 MCG CAPS capsule Take 145 mcg by mouth daily as needed (constipation).   Marland Kitchen LYRICA 150 MG capsule Take 150 mg by mouth 3 (three) times daily.  . ondansetron (ZOFRAN) 4 MG tablet 4 mg. As needed  . predniSONE (DELTASONE) 20 MG tablet Take 1 tablet (20 mg total) by mouth 2 (two) times daily.  Marland Kitchen spironolactone (ALDACTONE) 25 MG tablet Take 25 mg by mouth daily.  Marland Kitchen torsemide (DEMADEX) 20 MG tablet Take 3 tablets every morning  . traMADol (ULTRAM) 50 MG tablet Take 1 tablet (50 mg total) by mouth every 6 (six) hours as needed.  . triamcinolone cream (KENALOG) 0.1 % Apply 1 application topically 2 (two) times daily.  Marland Kitchen UNKNOWN TO PATIENT Diabetic ned, takes 60 units at  bedtime     Allergies  Allergen Reactions  . Penicillins Hives and Swelling    Has patient had a PCN reaction causing immediate rash, facial/tongue/throat swelling, SOB or lightheadedness with hypotension: yes- face swelling Has patient had a PCN reaction causing severe rash involving mucus membranes or skin necrosis: no Has patient had a PCN reaction that required hospitalization unknown (childhood allergy) Has patient had a PCN reaction occurring within the last 10 years: no If all of the above answers are "NO", then may proceed with Cephalosporin use.     . Iohexol Hives    pt developed itching and hives along with nasal congestion; needs 13 hour premeds for future studies, Onset Date: [id number]   . Codeine Nausea Only  . Iodine-131 Hives    hives  . Lisinopril Cough  . Sulfa Antibiotics Nausea And Vomiting    Social History   Socioeconomic History  . Marital status: Single    Spouse name: Not on file  . Number of children: 2  . Years of education: 8  . Highest education level: Not on file  Social Needs  . Financial resource strain: Not on file  . Food insecurity - worry: Not on file  . Food insecurity - inability: Not on file  . Transportation needs - medical: Not on file  . Transportation needs - non-medical: Not on file  Occupational History  . Occupation: N/A    Comment: disabled  Tobacco Use  . Smoking status: Former Smoker    Types: Cigarettes    Last attempt to quit: 05/22/1992    Years since quitting: 24.8  . Smokeless tobacco: Never Used  Substance and Sexual Activity  . Alcohol use: No    Comment: quit 20 yrs ago  . Drug use: No  . Sexual activity: Not Currently  Other Topics Concern  . Not on file  Social History Narrative   Lives alone   caffeine drinks 2 cups of coffee a day, occasional soda      Review of Systems: General: negative for chills, fever, night sweats or weight changes.  Cardiovascular: negative for chest pain, dyspnea on exertion, edema, orthopnea, palpitations, paroxysmal nocturnal dyspnea or shortness of breath Dermatological: negative for rash Respiratory: negative for cough or wheezing Urologic: negative for hematuria Abdominal: negative for nausea, vomiting, diarrhea, bright red blood per rectum, melena, or hematemesis Neurologic: negative for visual changes, syncope, or dizziness All other systems reviewed and are otherwise negative except as noted above.    Blood pressure (!) 147/83, pulse 81, height 4\' 11"  (1.499 m), weight 221 lb 6.4 oz (100.4 kg).  General appearance: alert  and no distress Neck: no adenopathy, no carotid bruit, no JVD, supple, symmetrical, trachea midline and thyroid not enlarged, symmetric, no tenderness/mass/nodules Lungs: clear to auscultation bilaterally Heart: regular rate and rhythm, S1, S2 normal, no murmur, click, rub or gallop Extremities: 2+ pitting edema bilaterally Pulses: 2+ and symmetric Skin: Skin color, texture, turgor normal. No rashes or lesions Neurologic: Alert and oriented X 3, normal strength and tone. Normal symmetric reflexes. Normal coordination and gait  EKG sinus rhythm at 81 without ST or T-wave changes. I personally reviewed this EKG.  ASSESSMENT AND PLAN:   Chronic diastolic heart failure ([place]) Ms. Postma has chronic diastolic heart failure. Her last 2-D echo performed 07/06/15 showed normal LV systolic function with grade 2 diastolic dysfunction. When I heard the office 2 months ago I changed her fear of furosemide to 3 times a day torsemide. She has  since gained approximately 12 pounds and has 1-2+ pitting edema. She does have orthopnea and dyspnea on exertion. She says she watches her salt intake but eats a lot of processed foods. I'm going to check blood work on her today, add Zaroxolyn every other day in the morning and have her see the heart failure clinic next week for assistance in treatment. She may require hospitalization and IV diuretics.  Hypertension History of essential hypertension blood pressure measured 147/83. She is not on any hypertensive medications.      Lorretta Harp MD FACP,FACC,FAHA, [facility] 04/04/2017 9:30 AM

## 2017-04-04 NOTE — Assessment & Plan Note (Signed)
Ms. Wandler has chronic diastolic heart failure. Her last 2-D echo performed 07/06/15 showed normal LV systolic function with grade 2 diastolic dysfunction. When I heard the office 2 months ago I changed her fear of furosemide to 3 times a day torsemide. She has since gained approximately 12 pounds and has 1-2+ pitting edema. She does have orthopnea and dyspnea on exertion. She says she watches her salt intake but eats a lot of processed foods. I'm going to check blood work on her today, add Zaroxolyn every other day in the morning and have her see the heart failure clinic next week for assistance in treatment. She may require hospitalization and IV diuretics.

## 2017-04-11 ENCOUNTER — Telehealth: Payer: Self-pay | Admitting: Cardiovascular Disease

## 2017-04-11 DIAGNOSIS — Z79899 Other long term (current) drug therapy: Secondary | ICD-10-CM

## 2017-04-11 NOTE — Telephone Encounter (Signed)
Patient calling, states that at the time of her last visit she weighed 221 lbs and she is now down to 208lbs. Patient states that Dr. Gwenlyn Found prescribed a "booster pill" to go with the fluid pill and would like to know if she should cut the dosage or continue with medication the same?

## 2017-04-11 NOTE — Telephone Encounter (Signed)
Returned call to patient of Dr. Gwenlyn Found. Patient started on metolazone 11/20 and took it EVERY DAY instead of QOD as prescribed. She reports she thinks is dried out and is having cramps. She reports improvements in SOB. Uses CPAP but may not sleep as well with it on. Denies swelling. She would like to know if she should continue taking same dose of torsemide and metolazone. Advised to take meds AS PRESCRIBED until I will be able to call her back with MD recommendations.  Notified patient of lab results from 11/20 - renal fxn improved. Informed her that MD may recommend recheck of labs, since she has been using more diuretic than prescribed.   Per AVS, she was referred to HF clinic but appt has not been scheduled.   Advised would need to route to MD for advice on her medications.

## 2017-04-14 NOTE — Telephone Encounter (Signed)
Pt notified she will be in a week for lab draw and she already has an appt with Rhonda 04-27-17 she will follow up at the time

## 2017-04-14 NOTE — Telephone Encounter (Signed)
Re check BMET next week and have her come back in 1-2 weeks to see a MLP

## 2017-04-17 ENCOUNTER — Ambulatory Visit (HOSPITAL_COMMUNITY)
Admission: RE | Admit: 2017-04-17 | Discharge: 2017-04-17 | Disposition: A | Discharge status: Home / Self Care | Payer: Medicare Other | Source: Ambulatory Visit | Attending: Internal Medicine | Admitting: Internal Medicine

## 2017-04-17 VITALS — BP 128/76 | HR 75 | Wt 208.8 lb

## 2017-04-17 DIAGNOSIS — G4733 Obstructive sleep apnea (adult) (pediatric): Secondary | ICD-10-CM | POA: Insufficient documentation

## 2017-04-17 DIAGNOSIS — Z823 Family history of stroke: Secondary | ICD-10-CM | POA: Insufficient documentation

## 2017-04-17 DIAGNOSIS — I110 Hypertensive heart disease with heart failure: Secondary | ICD-10-CM | POA: Diagnosis present

## 2017-04-17 DIAGNOSIS — Z7982 Long term (current) use of aspirin: Secondary | ICD-10-CM | POA: Diagnosis not present

## 2017-04-17 DIAGNOSIS — E119 Type 2 diabetes mellitus without complications: Secondary | ICD-10-CM | POA: Insufficient documentation

## 2017-04-17 DIAGNOSIS — Z880 Allergy status to penicillin: Secondary | ICD-10-CM | POA: Insufficient documentation

## 2017-04-17 DIAGNOSIS — Z79899 Other long term (current) drug therapy: Secondary | ICD-10-CM | POA: Insufficient documentation

## 2017-04-17 DIAGNOSIS — Z79891 Long term (current) use of opiate analgesic: Secondary | ICD-10-CM | POA: Insufficient documentation

## 2017-04-17 DIAGNOSIS — Z794 Long term (current) use of insulin: Secondary | ICD-10-CM | POA: Diagnosis not present

## 2017-04-17 DIAGNOSIS — I5032 Chronic diastolic (congestive) heart failure: Secondary | ICD-10-CM | POA: Insufficient documentation

## 2017-04-17 DIAGNOSIS — I10 Essential (primary) hypertension: Secondary | ICD-10-CM

## 2017-04-17 DIAGNOSIS — Z87891 Personal history of nicotine dependence: Secondary | ICD-10-CM | POA: Insufficient documentation

## 2017-04-17 DIAGNOSIS — Z833 Family history of diabetes mellitus: Secondary | ICD-10-CM | POA: Diagnosis not present

## 2017-04-17 LAB — BASIC METABOLIC PANEL
ANION GAP: 11 (ref 5–15)
BUN: 66 mg/dL — ABNORMAL HIGH (ref 6–20)
CHLORIDE: 100 mmol/L — AB (ref 101–111)
CO2: 31 mmol/L (ref 22–32)
Calcium: 9.6 mg/dL (ref 8.9–10.3)
Creatinine, Ser: 1.97 mg/dL — ABNORMAL HIGH (ref 0.44–1.00)
GFR calc non Af Amer: 27 mL/min — ABNORMAL LOW (ref >60)
GFR, EST AFRICAN AMERICAN: 31 mL/min — AB (ref >60)
Glucose, Bld: 83 mg/dL (ref 65–99)
POTASSIUM: 4 mmol/L (ref 3.5–5.1)
SODIUM: 142 mmol/L (ref 135–145)

## 2017-04-17 MED ORDER — METOLAZONE 5 MG PO TABS: 5.0000 mg | ORAL_TABLET | ORAL | 2 refills | Status: DC | PRN

## 2017-04-17 MED ORDER — SPIRONOLACTONE 25 MG PO TABS: 12.5000 mg | ORAL_TABLET | Freq: Every day | ORAL | 3 refills | Status: DC

## 2017-04-17 NOTE — Patient Instructions (Signed)
Labs today (will call for abnormal results, otherwise no news is good news)  DECREASE Spironolactone to 12.5 mg (0.5 Tablet) Once Daily  Only take Metolazone as needed for wt gain of 3 lbs overnight or 5 lbs in a week.  Take potassium 20 mEq when you take metolazone.  Decrease your fluid intake.  Follow up in 2 months

## 2017-04-17 NOTE — Progress Notes (Signed)
ADVANCED HF CLINIC CONSULT NOTE  Referring Physician: Dr.Berry Primary Cardiologist: Dr. Gwenlyn Found  PCP: Dr. Ardeth Perfect  HPI:  Amy [name] is a 56 yo woman with obesity, HTN, diabetes with legal blindness, HL, OSA and diastolic HF.  She is referred by Dr. Gwenlyn Found for further evaluation and management of diastolic HF.   She had a negative Myoview 10/10/14 and a 2-D echo performed 07/06/15 that revealed normal LV/RV function and grade 2 diastolic dysfunction. (reviewed personally)   She saw Dr. Gwenlyn Found 2 weeks ago and was volume overloaded and weight up to 222 pounds. Torsemide was increased form 40mg  bid to 40mg  tid and zaroxolyn was added every other day. Weight now down 15 pounds. Saw Dr. Lodema Hong last week and said her creatinine was up so cut her back to torsemide 40 bid. Metolazone continued every other day. She responds very well to metolazone with good diuresis. Now having cramps. A little dizzy. She drinks a lot of water and ice. Says she tries to drink at least 8 little bottles of water a day. Says she doesn't eat much.  Can walk with a cane (due to blindness). Edema resolved.  No orthopnea or PND. No CP. Wears CPAP at night   Review of Systems: [y] = yes, [ ]  = no   General: Weight gain [ y]; Weight loss Blue.Reese ]; Anorexia [ ] ; Fatigue Blue.Reese ]; Fever [ ] ; Chills [ ] ; Weakness [ ]   Cardiac: Chest pain/pressure [ ] ; Resting SOB [ ] ; Exertional SOB Blue.Reese ]; Orthopnea [ ] ; Pedal Edema Blue.Reese ]; Palpitations [ ] ; Syncope [ ] ; Presyncope [ ] ; Paroxysmal nocturnal dyspnea[ ]   Pulmonary: Cough [ ] ; Wheezing[ ] ; Hemoptysis[ ] ; Sputum [ ] ; Snoring [ ]   GI: Vomiting[ ] ; Dysphagia[ ] ; Melena[ ] ; Hematochezia [ ] ; Heartburn[ ] ; Abdominal pain [ ] ; Constipation [ ] ; Diarrhea [ ] ; BRBPR [ ]   GU: Hematuria[ ] ; Dysuria [ ] ; Nocturia[ ]   Vascular: Pain in legs with walking [ ] ; Pain in feet with lying flat [ ] ; Non-healing sores [ ] ; Stroke [ ] ; TIA [ ] ; Slurred speech [ ] ;  Neuro: Headaches[ ] ; Vertigo[ ] ; Seizures[ ] ;  Paresthesias[ ] ;Blurred vision [ ] ; Diplopia [ ] ; Vision changes Blue.Reese ]  Ortho/Skin: Arthritis Blue.Reese ]; Joint pain [ y]; Muscle pain [ ] ; Joint swelling [ ] ; Back Pain [ ] ; Rash [ ]   Psych: Depression[ ] ; Anxiety[ ]   Heme: Bleeding problems [ ] ; Clotting disorders [ ] ; Anemia [ ]   Endocrine: Diabetes Blue.Reese ]; Thyroid dysfunction[ ]    Past Medical History:  Diagnosis Date  . Asthma   . Blind   . Blindness and low vision    left eye glass eye,  legally blind in right eye  . Diabetes mellitus   . Diabetic neuropathy ([place])   . Hyperlipidemia   . Hypertension   . Hypothyroidism   . Spinal stenosis     Current Outpatient Medications  Medication Sig Dispense Refill  . acetaminophen (TYLENOL) 325 MG tablet Take 2 tablets (650 mg total) by mouth every 6 (six) hours as needed for mild pain (or Fever >/= 101). 30 tablet 0  . albuterol (PROVENTIL HFA;VENTOLIN HFA) 108 (90 BASE) MCG/ACT inhaler Inhale 2 puffs into the lungs every 4 (four) hours as needed for wheezing or shortness of breath. For short    . aspirin EC 81 MG tablet Take 81 mg by mouth daily.    . cetirizine (ZYRTEC) 10 MG tablet Take 10 mg by mouth  daily.    . cyclobenzaprine (FLEXERIL) 10 MG tablet 10 mg as needed.    . Diclofenac Sodium 1 % CREA Place 1 Squirt onto the skin 3 (three) times daily as needed. Apply to left shoulder 120 g 0  . esomeprazole (NEXIUM) 40 MG capsule Take 40 mg by mouth 2 (two) times daily before a meal.     . fluorometholone (FML FORTE) 0.25 % ophthalmic suspension Place 1 drop into the right eye 2 (two) times daily.    . fluticasone (FLONASE) 50 MCG/ACT nasal spray Place into both nostrils daily.    Marland Kitchen HUMALOG KWIKPEN 100 UNIT/ML KiwkPen     . insulin degludec (TRESIBA FLEXTOUCH) 100 UNIT/ML SOPN FlexTouch Pen Inject 60 Units into the skin daily at 10 pm.    . levothyroxine (SYNTHROID, LEVOTHROID) 150 MCG tablet Take 150 mcg by mouth daily before breakfast.    . Linaclotide (LINZESS) 145 MCG CAPS capsule Take  145 mcg by mouth daily as needed (constipation).     Marland Kitchen LYRICA 150 MG capsule Take 150 mg by mouth 3 (three) times daily.    . metolazone (ZAROXOLYN) 5 MG tablet Take 1 tablet (5 mg total) every other day by mouth. 30 min. Before your morning dose of Torsemide. 30 tablet 2  . ondansetron (ZOFRAN) 4 MG tablet 4 mg. As needed    . spironolactone (ALDACTONE) 25 MG tablet Take 25 mg by mouth daily.    Marland Kitchen torsemide (DEMADEX) 20 MG tablet Take 3 tablets every morning (Patient taking differently: 40 mg daily. Take 3 tablets every morning) 270 tablet 3  . traMADol (ULTRAM) 50 MG tablet Take 1 tablet (50 mg total) by mouth every 6 (six) hours as needed. 15 tablet 0  . triamcinolone cream (KENALOG) 0.1 % Apply 1 application topically 2 (two) times daily.    Marland Kitchen UNKNOWN TO PATIENT Diabetic ned, takes 60 units at bedtime     No current facility-administered medications for this encounter.     Allergies  Allergen Reactions  . Penicillins Hives and Swelling    Has patient had a PCN reaction causing immediate rash, facial/tongue/throat swelling, SOB or lightheadedness with hypotension: yes- face swelling Has patient had a PCN reaction causing severe rash involving mucus membranes or skin necrosis: no Has patient had a PCN reaction that required hospitalization unknown (childhood allergy) Has patient had a PCN reaction occurring within the last 10 years: no If all of the above answers are "NO", then may proceed with Cephalosporin use.   . Iohexol Hives    pt developed itching and hives along with nasal congestion; needs 13 hour premeds for future studies, Onset Date: [id number]   . Codeine Nausea Only  . Iodine-131 Hives    hives  . Lisinopril Cough  . Sulfa Antibiotics Nausea And Vomiting      Social History   Socioeconomic History  . Marital status: Single    Spouse name: Not on file  . Number of children: 2  . Years of education: 60  . Highest education level: Not on file  Social Needs  .  Financial resource strain: Not on file  . Food insecurity - worry: Not on file  . Food insecurity - inability: Not on file  . Transportation needs - medical: Not on file  . Transportation needs - non-medical: Not on file  Occupational History  . Occupation: N/A    Comment: disabled  Tobacco Use  . Smoking status: Former Smoker    Types: Cigarettes  Last attempt to quit: 05/22/1992    Years since quitting: 24.9  . Smokeless tobacco: Never Used  Substance and Sexual Activity  . Alcohol use: No    Comment: quit 20 yrs ago  . Drug use: No  . Sexual activity: Not Currently  Other Topics Concern  . Not on file  Social History Narrative   Lives alone   caffeine drinks 2 cups of coffee a day, occasional soda       Family History  Problem Relation Age of Onset  . Diabetes Sister   . Glaucoma Sister   . Hypertension Sister   . Stroke Brother   . Heart attack Paternal Aunt     Vitals:   04/17/17 1107  BP: 128/76  Pulse: 75  SpO2: 92%  Weight: 208 lb 12.8 oz (94.7 kg)    PHYSICAL EXAM: General:  Sitting in WC. Legally blind HEENT: normal Neck: supple. no JVD. Carotids 2+ bilat; no bruits. No lymphadenopathy or thryomegaly appreciated. Cor: PMI nondisplaced. Regular rate & rhythm. No rubs, gallops or murmurs. Lungs: clear Abdomen: obese soft, nontender, nondistended. No hepatosplenomegaly. No bruits or masses. Good bowel sounds. Extremities: no cyanosis, clubbing, rash, edema Neuro: alert & oriented x 3, cranial nerves grossly intact x for blindness. moves all 4 extremities w/o difficulty. Affect pleasant.    ASSESSMENT & PLAN: 1. Chronic diastolic HF - Echo 8/12 Normal LV/R function Grade II DD - She has very poor insight into her HF - Volume status currently improved after recent titration of diuretics. However she was likely over diuresed and torsemide recently cut back. - Will continue torsemide 40 bid. Add spiro 12.5 daily - Have instructed her to take metolazone  only as needed with kcl 20 - Long talk about limiting fluids and ice and how to monitor her HF better.  - Check BMET  2. HTN - Blood pressure well controlled. Continue current regimen.  3. DM2 - Per PCP With HF would strongly consider Jardiance  4. OSA  - Continue CPAP  Will see back in 1-2 months.   Glori Bickers, MD  12:39 PM

## 2017-04-19 ENCOUNTER — Telehealth (HOSPITAL_COMMUNITY): Payer: Self-pay | Admitting: Cardiology

## 2017-04-19 DIAGNOSIS — I5032 Chronic diastolic (congestive) heart failure: Secondary | ICD-10-CM

## 2017-04-19 NOTE — Telephone Encounter (Signed)
\  Patient aware. Patient voiced understanding. Repeat labs 12*20 [phone]

## 2017-04-22 IMAGING — RF DG ESOPHAGUS
10 of 12 series · 20 of 24 positions shown · non-contrast
Comparison: None.

CLINICAL DATA: Pharyngeal dysphagia

EXAM:
ESOPHOGRAM/BARIUM SWALLOW
TECHNIQUE: Single contrast examination was performed using  thin barium.
FLUOROSCOPY TIME:  Radiation Exposure Index (as provided by the
fluoroscopic device):
If the device does not provide the exposure index:
Fluoroscopy Time:  1 minutes 36 seconds
Number of Acquired Images:

[Series 1: run · 11 of 25 slices shown (1 of 10)]
[im 1/25]
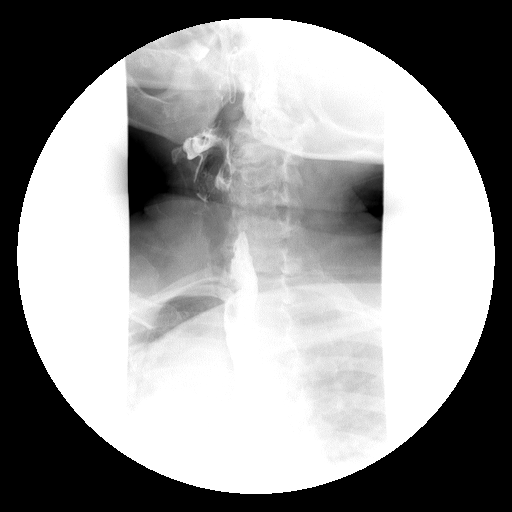
[im 3/25]
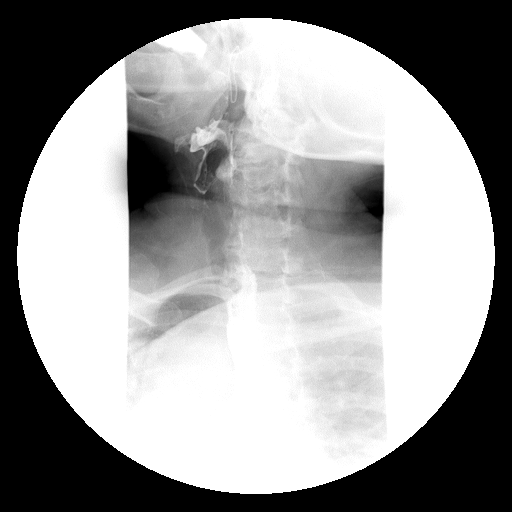
[im 7/25]
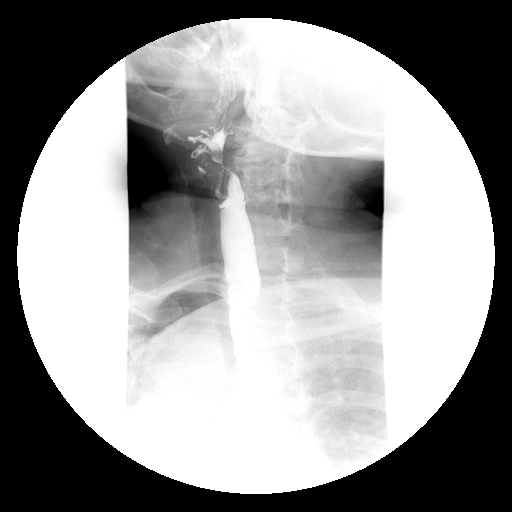
[im 9/25]
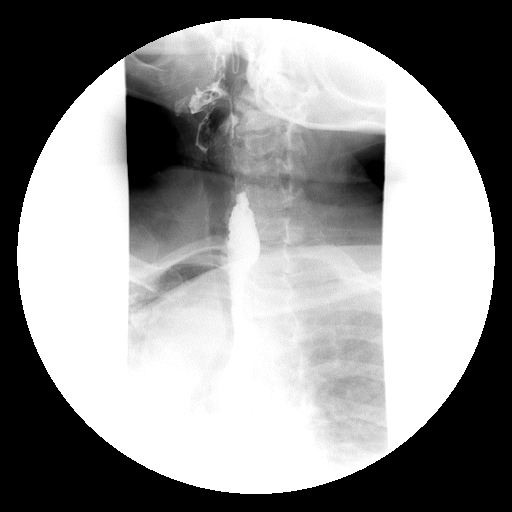
[im 11/25]
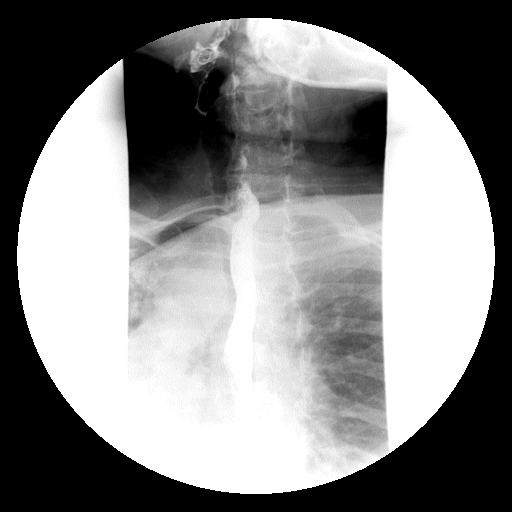
[im 13/25]
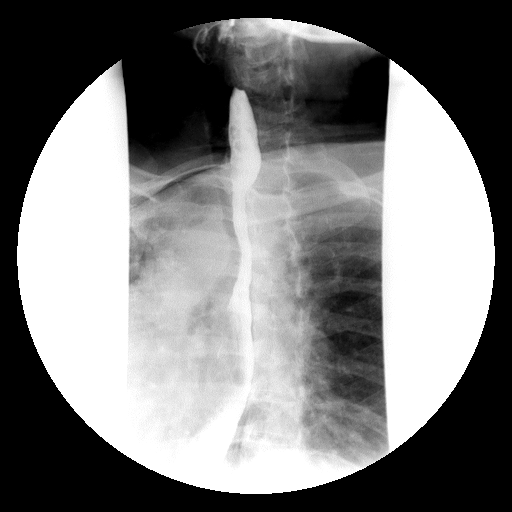
[im 15/25]
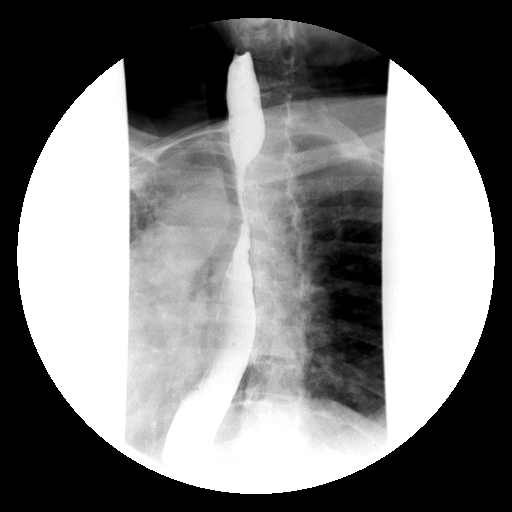
[im 19/25]
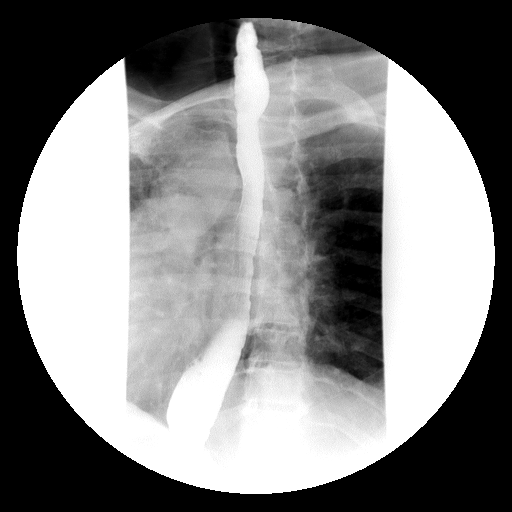
[im 21/25]
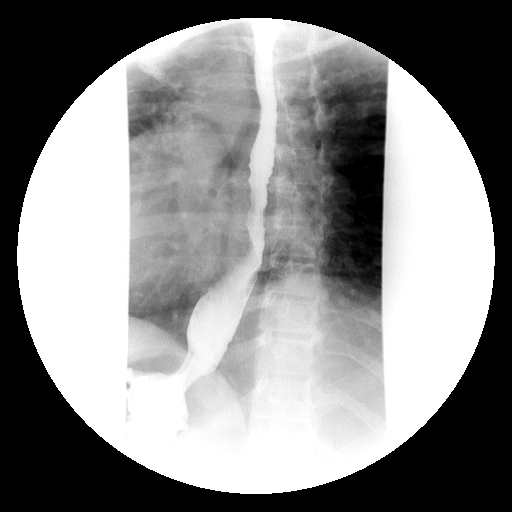
[im 23/25]
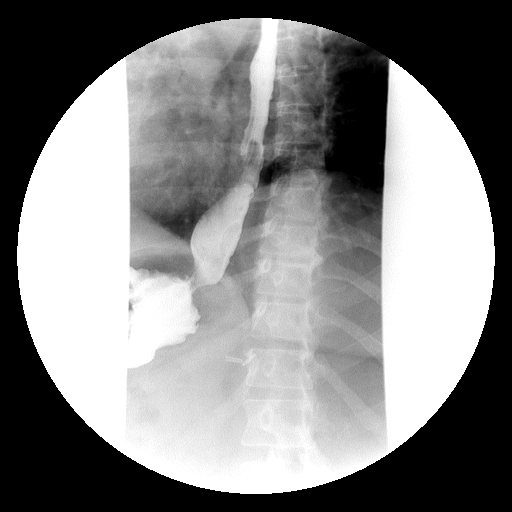
[im 25/25]
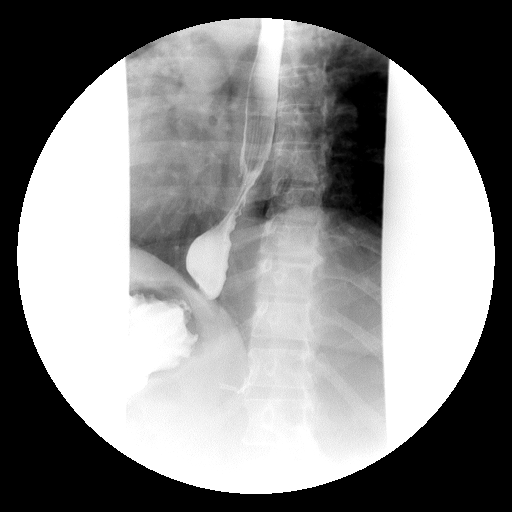

[Series 2: run · 1 of 1 slices shown (2 of 10)]
[im 1/1]
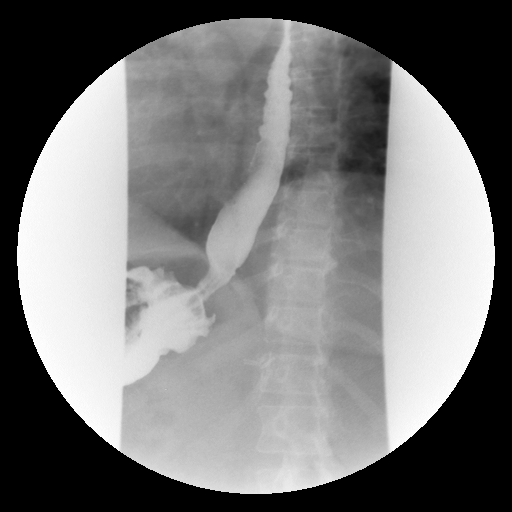

[Series 4: run · 1 of 1 slices shown (3 of 10)]
[im 1/1]
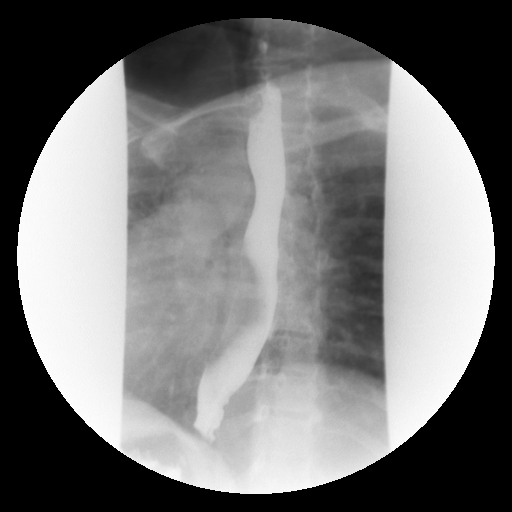

[Series 5: run · 1 of 1 slices shown (4 of 10)]
[im 1/1]
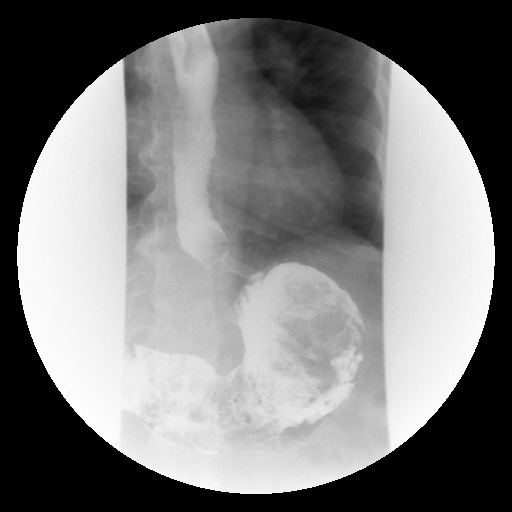

[Series 6: run · 1 of 1 slices shown (5 of 10)]
[im 1/1]
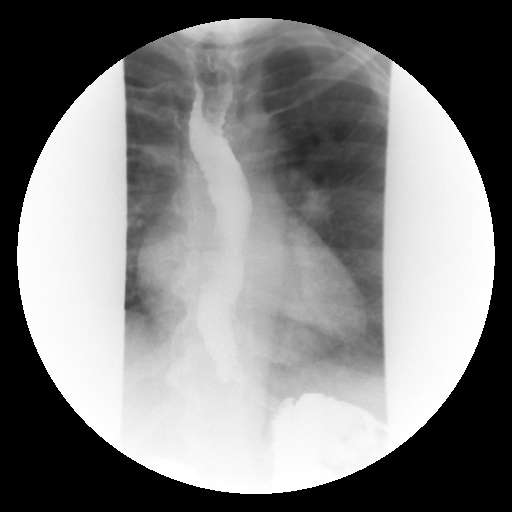

[Series 7: run · 1 of 1 slices shown (6 of 10)]
[im 1/1]
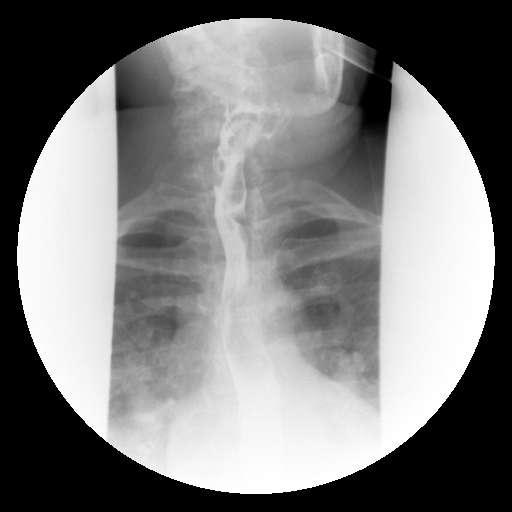

[Series 8: run · 1 of 3 slices shown (7 of 10)]
[im 1/3]
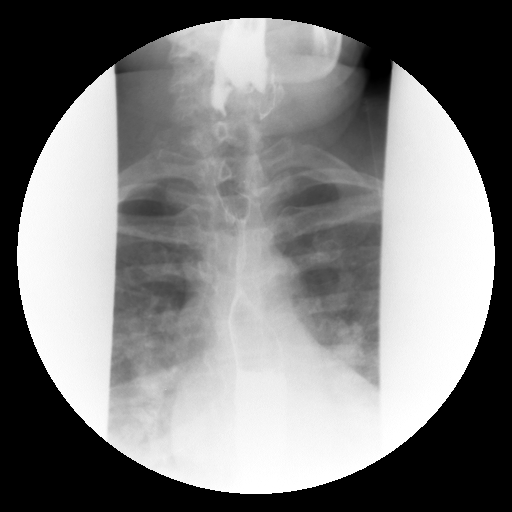

[Series 10: run · 1 of 1 slices shown (8 of 10)]
[im 1/1]
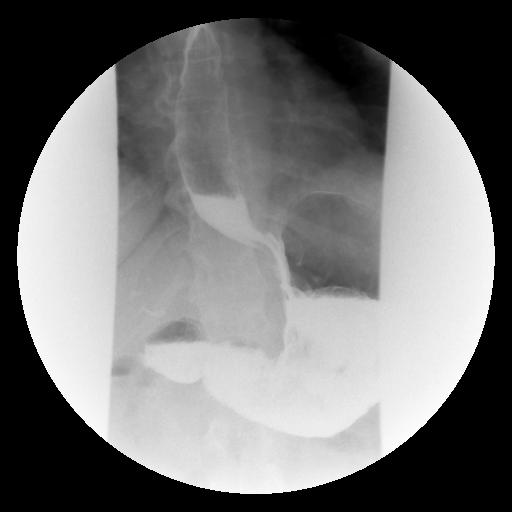

[Series 11: run · 1 of 1 slices shown (9 of 10)]
[im 1/1]
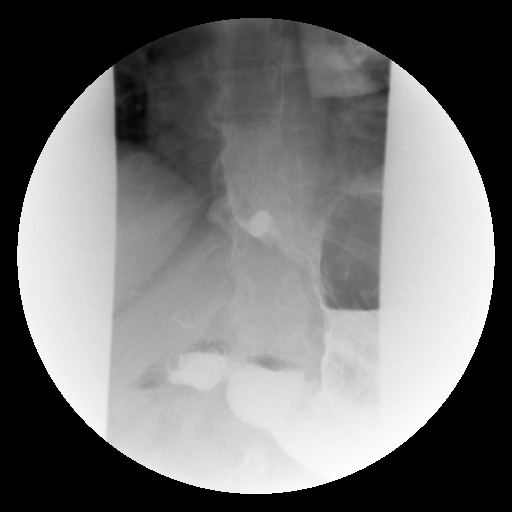

[Series 12: run · 1 of 1 slices shown (10 of 10)]
[im 1/1]
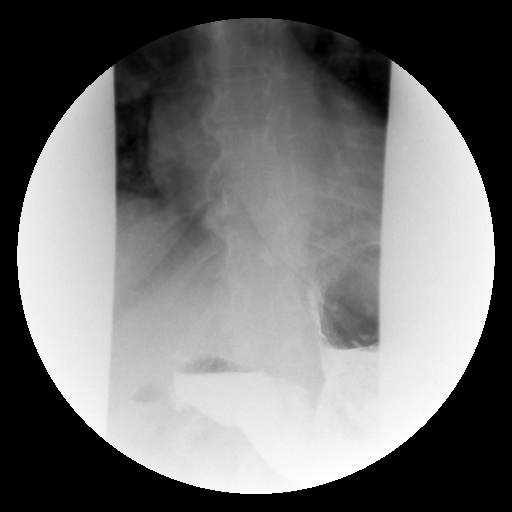

[20 of 24 positions shown; findings below may reference images not displayed]

FINDINGS: Moderate decrease in esophageal peristalsis diffusely. No stricture
or mass lesion.

Negative for hiatal hernia.  Negative for gastroesophageal reflux.

Barium tablet passed readily into the stomach without delay.
IMPRESSION: Presbyesophagus.  Negative for stricture or mass.

## 2017-04-25 NOTE — Progress Notes (Deleted)
Cardiology Office Note   Date:  04/25/2017   ID:  Pallie, Swigert 10-22-1960, MRN [id number]  PCP:  Velna Hatchet, MD  Cardiologist:  Dr Gwenlyn Found, 04/04/2017 CHF: Dr Haroldine Laws,  04/17/2017 Rosaria Ferries, PA-C 01/13/2017  No chief complaint on file.   History of Present Illness: Amy [name] is a 56 y.o. female with a history of HTN, hypothyroidism, HLD, DM II, chronic diastolic heart failure and OSA. Neg MV 09/2014, EF nl on echo 06/2015 w/ grade 2 dd, blindness  04/04/2017 office visit, pt w/ wt gain 12 lbs despite tid torsemide. Metolazone added and CHF referral made. 12/03 office visit, pt felt over-diuresed, torsemide changed to 40 mg bid and spiro 12.5 mg qd added, metolazone prn only w/ Kdur 20 meq, consider Jardiance for DM rx.  Amy [name] presents for ***   Past Medical History:  Diagnosis Date  . Asthma   . Blind   . Blindness and low vision    left eye glass eye,  legally blind in right eye  . Diabetes mellitus   . Diabetic neuropathy ([place])   . Hyperlipidemia   . Hypertension   . Hypothyroidism   . Spinal stenosis     Past Surgical History:  Procedure Laterality Date  . ABDOMINAL HYSTERECTOMY  1990  . [place]   x 2  . CHOLECYSTECTOMY  2008  . ENUCLEATION Bilateral 09/15/1998  . EYE SURGERY  2016   fitting artificial eye    Current Outpatient Medications  Medication Sig Dispense Refill  . acetaminophen (TYLENOL) 325 MG tablet Take 2 tablets (650 mg total) by mouth every 6 (six) hours as needed for mild pain (or Fever >/= 101). 30 tablet 0  . albuterol (PROVENTIL HFA;VENTOLIN HFA) 108 (90 BASE) MCG/ACT inhaler Inhale 2 puffs into the lungs every 4 (four) hours as needed for wheezing or shortness of breath. For short    . aspirin EC 81 MG tablet Take 81 mg by mouth daily.    . cetirizine (ZYRTEC) 10 MG tablet Take 10 mg by mouth daily.    . cyclobenzaprine (FLEXERIL) 10 MG tablet 10 mg as needed.    . Diclofenac Sodium  1 % CREA Place 1 Squirt onto the skin 3 (three) times daily as needed. Apply to left shoulder 120 g 0  . esomeprazole (NEXIUM) 40 MG capsule Take 40 mg by mouth 2 (two) times daily before a meal.     . fluorometholone (FML FORTE) 0.25 % ophthalmic suspension Place 1 drop into the right eye 2 (two) times daily.    . fluticasone (FLONASE) 50 MCG/ACT nasal spray Place into both nostrils daily.    Marland Kitchen HUMALOG KWIKPEN 100 UNIT/ML KiwkPen     . insulin degludec (TRESIBA FLEXTOUCH) 100 UNIT/ML SOPN FlexTouch Pen Inject 60 Units into the skin daily at 10 pm.    . levothyroxine (SYNTHROID, LEVOTHROID) 150 MCG tablet Take 150 mcg by mouth daily before breakfast.    . Linaclotide (LINZESS) 145 MCG CAPS capsule Take 145 mcg by mouth daily as needed (constipation).     Marland Kitchen LYRICA 150 MG capsule Take 150 mg by mouth 3 (three) times daily.    . metolazone (ZAROXOLYN) 5 MG tablet Take 1 tablet (5 mg total) by mouth as needed. Take for wt gain of 3lbs overnight or 5 lbs in a week. 30 tablet 2  . ondansetron (ZOFRAN) 4 MG tablet 4 mg. As needed    . spironolactone (ALDACTONE) 25  MG tablet Take 0.5 tablets (12.5 mg total) by mouth daily. 45 tablet 3  . torsemide (DEMADEX) 20 MG tablet Take 3 tablets every morning (Patient taking differently: 40 mg daily. Take 3 tablets every morning) 270 tablet 3  . traMADol (ULTRAM) 50 MG tablet Take 1 tablet (50 mg total) by mouth every 6 (six) hours as needed. 15 tablet 0  . triamcinolone cream (KENALOG) 0.1 % Apply 1 application topically 2 (two) times daily.    Marland Kitchen UNKNOWN TO PATIENT Diabetic ned, takes 60 units at bedtime     No current facility-administered medications for this visit.     Allergies:   Penicillins; Iohexol; Codeine; Iodine-131; Lisinopril; and Sulfa antibiotics    Social History:  The patient  reports that she quit smoking about 24 years ago. Her smoking use included cigarettes. she has never used smokeless tobacco. She reports that she does not drink alcohol or  use drugs.   Family History:  The patient's family history includes Diabetes in her sister; Glaucoma in her sister; Heart attack in her paternal aunt; Hypertension in her sister; Stroke in her brother.    ROS:  Please see the history of present illness. All other systems are reviewed and negative.    PHYSICAL EXAM: VS:  There were no vitals taken for this visit. , BMI There is no height or weight on file to calculate BMI. GEN: Well nourished, well developed, female in no acute distress  HEENT: normal for age  Neck: no JVD, no carotid bruit, no masses Cardiac: RRR; no murmur, no rubs, or gallops Respiratory:  clear to auscultation bilaterally, normal work of breathing GI: soft, nontender, nondistended, + BS MS: no deformity or atrophy; no edema; distal pulses are 2+ in all 4 extremities   Skin: warm and dry, no rash Neuro:  Strength and sensation are intact Psych: euthymic mood, full affect   EKG:  EKG {ACTION; IS/IS ONG:29528413} ordered today. The ekg ordered today demonstrates ***   Recent Labs: 12/27/2016: NT-Pro BNP 83 02/04/2017: ALT 18; B Natriuretic Peptide 30.0; Hemoglobin 11.4; Platelets 166 04/17/2017: BUN 66; Creatinine, Ser 1.97; Potassium 4.0; Sodium 142    Lipid Panel    Component Value Date/Time   CHOL 170 10/22/2012 1351   TRIG 112 10/22/2012 1351   HDL 56 10/22/2012 1351   CHOLHDL 3.0 10/22/2012 1351   VLDL 22 10/22/2012 1351   LDLCALC 92 10/22/2012 1351     Wt Readings from Last 3 Encounters:  04/17/17 208 lb 12.8 oz (94.7 kg)  04/04/17 221 lb 6.4 oz (100.4 kg)  02/09/17 210 lb 9.6 oz (95.5 kg)     Other studies Reviewed: Additional studies/ records that were reviewed today include: ***.  ASSESSMENT AND PLAN:  1.  ***   Current medicines are reviewed at length with the patient today.  The patient {ACTIONS; HAS/DOES NOT HAVE:19233} concerns regarding medicines.  The following changes have been made:  {PLAN; NO CHANGE:13088:s}  Labs/ tests  ordered today include: *** No orders of the defined types were placed in this encounter.    Disposition:   FU with Dr Gwenlyn Found  Signed, Rosaria Ferries, PA-C  04/25/2017 5:18 PM    [facility] Phone: [phone]; Fax: [phone]  This note was written with the assistance of speech recognition software. Please excuse any transcriptional errors.

## 2017-04-27 ENCOUNTER — Ambulatory Visit: Payer: Medicare Other | Admitting: Physician Assistant

## 2017-05-04 ENCOUNTER — Other Ambulatory Visit (HOSPITAL_COMMUNITY): Payer: Medicare Other

## 2017-05-18 ENCOUNTER — Ambulatory Visit (HOSPITAL_COMMUNITY)
Admission: RE | Admit: 2017-05-18 | Discharge: 2017-05-18 | Disposition: A | Discharge status: Home / Self Care | Payer: Medicare Other | Source: Ambulatory Visit | Attending: Cardiology | Admitting: Cardiology

## 2017-05-18 DIAGNOSIS — I5032 Chronic diastolic (congestive) heart failure: Secondary | ICD-10-CM | POA: Insufficient documentation

## 2017-05-18 LAB — BASIC METABOLIC PANEL
ANION GAP: 8 (ref 5–15)
BUN: 69 mg/dL — ABNORMAL HIGH (ref 6–20)
CALCIUM: 9.8 mg/dL (ref 8.9–10.3)
CO2: 37 mmol/L — AB (ref 22–32)
CREATININE: 1.82 mg/dL — AB (ref 0.44–1.00)
Chloride: 98 mmol/L — ABNORMAL LOW (ref 101–111)
GFR, EST AFRICAN AMERICAN: 34 mL/min — AB (ref >60)
GFR, EST NON AFRICAN AMERICAN: 30 mL/min — AB (ref >60)
Glucose, Bld: 98 mg/dL (ref 65–99)
Potassium: 3.3 mmol/L — ABNORMAL LOW (ref 3.5–5.1)
SODIUM: 143 mmol/L (ref 135–145)

## 2017-05-22 ENCOUNTER — Telehealth (HOSPITAL_COMMUNITY): Payer: Self-pay | Admitting: *Deleted

## 2017-05-22 MED ORDER — POTASSIUM CHLORIDE CRYS ER 20 MEQ PO TBCR: 40.0000 meq | EXTENDED_RELEASE_TABLET | Freq: Once | ORAL | 0 refills | Status: DC

## 2017-05-22 NOTE — Telephone Encounter (Signed)
Notes recorded by Scarlette Calico, RN on 05/22/2017 at 4:49 PM EST Pt aware, rx sent in, will repeat labs at next ov

## 2017-05-22 NOTE — Telephone Encounter (Signed)
-----   Message from Jolaine Artist, MD sent at 05/18/2017  8:22 PM EST ----- Please give kcl 40 x 1  Repeat 2 weeks

## 2017-05-29 ENCOUNTER — Telehealth: Payer: Self-pay | Admitting: Nurse Practitioner

## 2017-05-29 DIAGNOSIS — Z9989 Dependence on other enabling machines and devices: Principal | ICD-10-CM

## 2017-05-29 DIAGNOSIS — G4733 Obstructive sleep apnea (adult) (pediatric): Secondary | ICD-10-CM

## 2017-05-29 NOTE — Telephone Encounter (Signed)
Will decrease to 8 cm

## 2017-05-29 NOTE — Telephone Encounter (Signed)
Pt states that over the weekend she called and left a message for Dr Rexene Alberts re: CPAP and there being too much pressure.  Pt states she has not been able to use the CPAP as a result of too much pressure.  Please call

## 2017-05-29 NOTE — Addendum Note (Signed)
Addended by: Otilio Jefferson on: 05/29/2017 04:58 PM   Modules accepted: Orders

## 2017-05-29 NOTE — Telephone Encounter (Signed)
We have not received any messages regarding cpap pressure. I recommend pulling a 30 day download from [place] and having Hoyle Sauer, NP review the data to decide if the pressures needing adjustment.

## 2017-05-29 NOTE — Telephone Encounter (Signed)
Successfully faxed DME order to [facility] re: decrease pressure to 8 cm. Called patient and advised her. Advised if she hasn't heard from [facility] in a few days, she needs to call them. She verbalized understanding, appreciation.

## 2017-06-09 ENCOUNTER — Telehealth (HOSPITAL_COMMUNITY): Payer: Self-pay | Admitting: *Deleted

## 2017-06-09 NOTE — Telephone Encounter (Signed)
Received report from [facility] [employer] that patient had a 4.6 lb wt gain overnight and increased SOB and swelling.    I called and spoke with patient and advised her to take 1 metolazone per prescription in the morning 30 mins prior to her torsemide.  Also advised her to call us back on Monday if her weight is still up.  Patient verbalized her understanding and will call us Monday if she isnt' feeling better.

## 2017-06-19 ENCOUNTER — Encounter (HOSPITAL_COMMUNITY): Payer: Medicare Other

## 2017-06-20 ENCOUNTER — Ambulatory Visit (HOSPITAL_COMMUNITY)
Admission: RE | Admit: 2017-06-20 | Discharge: 2017-06-20 | Disposition: A | Discharge status: Home / Self Care | Payer: Medicare Other | Source: Ambulatory Visit | Attending: Cardiology | Admitting: Cardiology

## 2017-06-20 ENCOUNTER — Telehealth (HOSPITAL_COMMUNITY): Payer: Self-pay | Admitting: Cardiology

## 2017-06-20 ENCOUNTER — Encounter (HOSPITAL_COMMUNITY): Payer: Self-pay

## 2017-06-20 VITALS — BP 124/70 | HR 64 | Wt 214.8 lb

## 2017-06-20 DIAGNOSIS — I10 Essential (primary) hypertension: Secondary | ICD-10-CM

## 2017-06-20 DIAGNOSIS — G4733 Obstructive sleep apnea (adult) (pediatric): Secondary | ICD-10-CM | POA: Diagnosis not present

## 2017-06-20 DIAGNOSIS — Z888 Allergy status to other drugs, medicaments and biological substances status: Secondary | ICD-10-CM | POA: Diagnosis not present

## 2017-06-20 DIAGNOSIS — Z794 Long term (current) use of insulin: Secondary | ICD-10-CM | POA: Insufficient documentation

## 2017-06-20 DIAGNOSIS — Z7982 Long term (current) use of aspirin: Secondary | ICD-10-CM | POA: Diagnosis not present

## 2017-06-20 DIAGNOSIS — N183 Chronic kidney disease, stage 3 (moderate): Secondary | ICD-10-CM | POA: Insufficient documentation

## 2017-06-20 DIAGNOSIS — N179 Acute kidney failure, unspecified: Secondary | ICD-10-CM | POA: Insufficient documentation

## 2017-06-20 DIAGNOSIS — I5032 Chronic diastolic (congestive) heart failure: Secondary | ICD-10-CM | POA: Diagnosis not present

## 2017-06-20 DIAGNOSIS — Z882 Allergy status to sulfonamides status: Secondary | ICD-10-CM | POA: Insufficient documentation

## 2017-06-20 DIAGNOSIS — E118 Type 2 diabetes mellitus with unspecified complications: Secondary | ICD-10-CM

## 2017-06-20 DIAGNOSIS — I130 Hypertensive heart and chronic kidney disease with heart failure and stage 1 through stage 4 chronic kidney disease, or unspecified chronic kidney disease: Secondary | ICD-10-CM | POA: Diagnosis not present

## 2017-06-20 DIAGNOSIS — H548 Legal blindness, as defined in USA: Secondary | ICD-10-CM | POA: Diagnosis not present

## 2017-06-20 DIAGNOSIS — Z9989 Dependence on other enabling machines and devices: Secondary | ICD-10-CM

## 2017-06-20 DIAGNOSIS — Z885 Allergy status to narcotic agent status: Secondary | ICD-10-CM | POA: Diagnosis not present

## 2017-06-20 DIAGNOSIS — J45909 Unspecified asthma, uncomplicated: Secondary | ICD-10-CM | POA: Insufficient documentation

## 2017-06-20 DIAGNOSIS — E669 Obesity, unspecified: Secondary | ICD-10-CM | POA: Insufficient documentation

## 2017-06-20 DIAGNOSIS — E785 Hyperlipidemia, unspecified: Secondary | ICD-10-CM | POA: Insufficient documentation

## 2017-06-20 DIAGNOSIS — Z880 Allergy status to penicillin: Secondary | ICD-10-CM | POA: Diagnosis not present

## 2017-06-20 DIAGNOSIS — Z87891 Personal history of nicotine dependence: Secondary | ICD-10-CM | POA: Diagnosis not present

## 2017-06-20 DIAGNOSIS — Z79890 Hormone replacement therapy (postmenopausal): Secondary | ICD-10-CM | POA: Insufficient documentation

## 2017-06-20 DIAGNOSIS — E039 Hypothyroidism, unspecified: Secondary | ICD-10-CM | POA: Diagnosis not present

## 2017-06-20 DIAGNOSIS — E1122 Type 2 diabetes mellitus with diabetic chronic kidney disease: Secondary | ICD-10-CM | POA: Insufficient documentation

## 2017-06-20 DIAGNOSIS — Z79899 Other long term (current) drug therapy: Secondary | ICD-10-CM | POA: Diagnosis not present

## 2017-06-20 LAB — BASIC METABOLIC PANEL
Anion gap: 11 (ref 5–15)
BUN: 43 mg/dL — AB (ref 6–20)
CALCIUM: 9.1 mg/dL (ref 8.9–10.3)
CHLORIDE: 103 mmol/L (ref 101–111)
CO2: 30 mmol/L (ref 22–32)
CREATININE: 1.35 mg/dL — AB (ref 0.44–1.00)
GFR calc Af Amer: 49 mL/min — ABNORMAL LOW (ref >60)
GFR calc non Af Amer: 42 mL/min — ABNORMAL LOW (ref >60)
Glucose, Bld: 99 mg/dL (ref 65–99)
Potassium: 3.3 mmol/L — ABNORMAL LOW (ref 3.5–5.1)
SODIUM: 144 mmol/L (ref 135–145)

## 2017-06-20 MED ORDER — METOLAZONE 5 MG PO TABS: 5.0000 mg | ORAL_TABLET | ORAL | 2 refills | Status: DC | PRN

## 2017-06-20 MED ORDER — TORSEMIDE 20 MG PO TABS: 40.0000 mg | ORAL_TABLET | Freq: Two times a day (BID) | ORAL | 11 refills | Status: DC

## 2017-06-20 MED ORDER — POTASSIUM CHLORIDE CRYS ER 20 MEQ PO TBCR: EXTENDED_RELEASE_TABLET | ORAL | 3 refills | Status: DC

## 2017-06-20 NOTE — Progress Notes (Signed)
ADVANCED HF CLINIC CONSULT NOTE  Referring Physician: Dr.Berry Primary Cardiologist: Dr. Gwenlyn Found  PCP: Dr. Ardeth Perfect  HPI:  Amy [name] is a 57 y.o. female with obesity, HTN, diabetes with legal blindness, HL, OSA and diastolic HF.  She is referred by Dr. Gwenlyn Found for further evaluation and management of diastolic HF.   She had a negative Myoview 10/10/14 and a 2-D echo performed 07/06/15 that revealed normal LV/RV function and grade 2 diastolic dysfunction. (reviewed personally)   She presents today for follow up. At last visit pt had been overdiuresed, so torsemide and spiro cut back.  She is up 6 lbs from that visit. She has been taking torsemide 40 mg q am and 20 mg q pm.  She is unsure when she made this change. She has been more swollen and SOB. She took a metolazone last week with a 7 lb weight loss. She continues to drink 4 bottles of water a day plus juice, coffee, and more. Not much of an appetite. Can walk with a cane (blindness). Mild orthopnea. Wearing CPAP every night.   Review of systems complete and found to be negative unless listed in HPI.    Past Medical History:  Diagnosis Date  . Asthma   . Blind   . Blindness and low vision    left eye glass eye,  legally blind in right eye  . Diabetes mellitus   . Diabetic neuropathy ([place])   . Hyperlipidemia   . Hypertension   . Hypothyroidism   . Spinal stenosis     Current Outpatient Medications  Medication Sig Dispense Refill  . acetaminophen (TYLENOL) 325 MG tablet Take 2 tablets (650 mg total) by mouth every 6 (six) hours as needed for mild pain (or Fever >/= 101). 30 tablet 0  . albuterol (PROVENTIL HFA;VENTOLIN HFA) 108 (90 BASE) MCG/ACT inhaler Inhale 2 puffs into the lungs every 4 (four) hours as needed for wheezing or shortness of breath. For short    . aspirin EC 81 MG tablet Take 81 mg by mouth daily.    . cetirizine (ZYRTEC) 10 MG tablet Take 10 mg by mouth daily.    . cyclobenzaprine (FLEXERIL) 10 MG tablet 10  mg as needed.    . Diclofenac Sodium 1 % CREA Place 1 Squirt onto the skin 3 (three) times daily as needed. Apply to left shoulder 120 g 0  . esomeprazole (NEXIUM) 40 MG capsule Take 40 mg by mouth 2 (two) times daily before a meal.     . fluorometholone (FML FORTE) 0.25 % ophthalmic suspension Place 1 drop into the right eye 2 (two) times daily.    . fluticasone (FLONASE) 50 MCG/ACT nasal spray Place into both nostrils daily.    Marland Kitchen HUMALOG KWIKPEN 100 UNIT/ML KiwkPen     . insulin degludec (TRESIBA FLEXTOUCH) 100 UNIT/ML SOPN FlexTouch Pen Inject 60 Units into the skin daily at 10 pm.    . levothyroxine (SYNTHROID, LEVOTHROID) 150 MCG tablet Take 150 mcg by mouth daily before breakfast.    . Linaclotide (LINZESS) 145 MCG CAPS capsule Take 145 mcg by mouth daily as needed (constipation).     Marland Kitchen LYRICA 150 MG capsule Take 150 mg by mouth 3 (three) times daily.    . metolazone (ZAROXOLYN) 5 MG tablet Take 1 tablet (5 mg total) by mouth as needed. Take for wt gain of 3lbs overnight or 5 lbs in a week. 30 tablet 2  . ondansetron (ZOFRAN) 4 MG tablet 4 mg. As  needed    . potassium chloride SA (K-DUR,KLOR-CON) 20 MEQ tablet Take 2 tablets (40 mEq total) by mouth once for 1 dose. 2 tablet 0  . spironolactone (ALDACTONE) 25 MG tablet Take 0.5 tablets (12.5 mg total) by mouth daily. 45 tablet 3  . torsemide (DEMADEX) 20 MG tablet Take 3 tablets every morning (Patient taking differently: 40 mg daily. Take 3 tablets every morning) 270 tablet 3  . traMADol (ULTRAM) 50 MG tablet Take 1 tablet (50 mg total) by mouth every 6 (six) hours as needed. 15 tablet 0  . triamcinolone cream (KENALOG) 0.1 % Apply 1 application topically 2 (two) times daily.    Marland Kitchen UNKNOWN TO PATIENT Diabetic ned, takes 60 units at bedtime     No current facility-administered medications for this encounter.     Allergies  Allergen Reactions  . Penicillins Hives and Swelling    Has patient had a PCN reaction causing immediate rash,  facial/tongue/throat swelling, SOB or lightheadedness with hypotension: yes- face swelling Has patient had a PCN reaction causing severe rash involving mucus membranes or skin necrosis: no Has patient had a PCN reaction that required hospitalization unknown (childhood allergy) Has patient had a PCN reaction occurring within the last 10 years: no If all of the above answers are "NO", then may proceed with Cephalosporin use.   . Iohexol Hives    pt developed itching and hives along with nasal congestion; needs 13 hour premeds for future studies, Onset Date: [id number]   . Codeine Nausea Only  . Iodine-131 Hives    hives  . Lisinopril Cough  . Sulfa Antibiotics Nausea And Vomiting      Social History   Socioeconomic History  . Marital status: Single    Spouse name: Not on file  . Number of children: 2  . Years of education: 43  . Highest education level: Not on file  Social Needs  . Financial resource strain: Not on file  . Food insecurity - worry: Not on file  . Food insecurity - inability: Not on file  . Transportation needs - medical: Not on file  . Transportation needs - non-medical: Not on file  Occupational History  . Occupation: N/A    Comment: disabled  Tobacco Use  . Smoking status: Former Smoker    Types: Cigarettes    Last attempt to quit: 05/22/1992    Years since quitting: 25.0  . Smokeless tobacco: Never Used  Substance and Sexual Activity  . Alcohol use: No    Comment: quit 20 yrs ago  . Drug use: No  . Sexual activity: Not Currently  Other Topics Concern  . Not on file  Social History Narrative   Lives alone   caffeine drinks 2 cups of coffee a day, occasional soda    Family History  Problem Relation Age of Onset  . Diabetes Sister   . Glaucoma Sister   . Hypertension Sister   . Stroke Brother   . Heart attack Paternal Aunt    Vitals:   06/20/17 0903  BP: 124/70  Pulse: 64  SpO2: 100%  Weight: 214 lb 12.8 oz (97.4 kg)   Wt Readings from Last  3 Encounters:  06/20/17 214 lb 12.8 oz (97.4 kg)  04/17/17 208 lb 12.8 oz (94.7 kg)  04/04/17 221 lb 6.4 oz (100.4 kg)    PHYSICAL EXAM: General: Sitting in WC. Legally blind.  Neck: Supple. JVP very difficult. Carotids 2+ bilat; no bruits. No thyromegaly or nodule noted. Cor: PMI  nondisplaced. RRR, No M/G/R noted Lungs: CTAB, normal effort. Abdomen: Soft, non-tender, non-distended, no HSM. No bruits or masses. +BS  Extremities: No cyanosis, clubbing, or rash. Trace to 1+ edema bilaterally.  Neuro: Alert & orientedx3, cranial nerves grossly intact. moves all 4 extremities w/o difficulty. Affect pleasant   ASSESSMENT & PLAN: 1. Chronic diastolic HF - Echo 5/28 Normal LV/R function Grade II DD - She has very poor insight into her HF - Volume status elevated on exam.   - Increase torsemide 40 mg BID. BMET today. Take metolazone 2.5 mg tomorrow.  - Continue spiro 12.5 mg daily.  - Reinforced fluid restriction to < 2 L daily, sodium restriction to less than 2000 mg daily, and the importance of daily weights.   2. HTN - BP stable. Leave room for diuresis.   3. DM2 - Per PCP.  - With HF would strongly consider Jardiance  4. OSA  - Continue CPAP qhs.   5. AKI on CKD III - BMET today.   Shirley Friar, PA-C  9:04 AM  Greater than 50% of the 25 minute visit was spent in counseling/coordination of care regarding disease state education, salt/fluid restriction, sliding scale diuretics, and medication compliance.

## 2017-06-20 NOTE — Patient Instructions (Signed)
INCREASE Torsemide to 40 mg (2 tabs) TWICE DAILY.  Take Metolazone 5 mg (1 tab) once tomorrow morning 30 minutes before your morning torsemide dose. If you still feel symptomatic, may take another metolazone on Saturday.  Routine lab work today. Will notify you of abnormal results, otherwise no news is good news!  Follow up 2 weeks with [name] PA-C.  ____________________________________________________________ Amy [name] Code: 9001  Take all medication as prescribed the day of your appointment. Bring all medications with you to your appointment.  Do the following things EVERYDAY: 1) Weigh yourself in the morning before breakfast. Write it down and keep it in a log. 2) Take your medicines as prescribed 3) Eat low salt foods-Limit salt (sodium) to 2000 mg per day.  4) Stay as active as you can everyday 5) Limit all fluids for the day to less than 2 liters

## 2017-06-20 NOTE — Telephone Encounter (Signed)
-----   Message from Shirley Friar, PA-C sent at 06/20/2017 10:52 AM EST ----- Please confirm how much potassium she is taking.  Needs to increase regimen by 20 meq.   Make sure she takes an extra 40 meq on metolazone days.     Legrand Como [address] [place], PA-C 06/20/2017 10:52 AM

## 2017-06-20 NOTE — Telephone Encounter (Signed)
Patient aware. Patient voiced understanding  

## 2017-06-21 ENCOUNTER — Telehealth (HOSPITAL_COMMUNITY): Payer: Self-pay | Admitting: *Deleted

## 2017-06-21 NOTE — Telephone Encounter (Signed)
Pt called c/o no urination all day. Patient took increased dose of torsemide as prescribed by Jonni Sanger also took Metolazone per her discharge instructions from [facility] office visit. (see below) Patient does not have any symptoms. Per Jonni Sanger her creatinine looked ok if she does not urinate by tomorrow have her come in for lab work. Pt agrees to call clinic in the AM and let me know how she is doing.   Discharge instructions 06/20/2017:  INCREASE Torsemide to 40 mg (2 tabs) TWICE DAILY.  Take Metolazone 5 mg (1 tab) once tomorrow morning 30 minutes before your morning torsemide dose. If you still feel symptomatic, may take another metolazone on Saturday.  Routine lab work today. Will notify you of abnormal results, otherwise no news is good news!  Follow up 2 weeks with [name] PA-C.

## 2017-06-23 ENCOUNTER — Other Ambulatory Visit: Payer: Self-pay | Admitting: Internal Medicine

## 2017-06-23 ENCOUNTER — Ambulatory Visit: Payer: Medicare Other | Admitting: Podiatry

## 2017-06-23 DIAGNOSIS — M5136 Other intervertebral disc degeneration, lumbar region: Secondary | ICD-10-CM

## 2017-06-30 ENCOUNTER — Ambulatory Visit (INDEPENDENT_AMBULATORY_CARE_PROVIDER_SITE_OTHER): Payer: Medicare Other | Admitting: Podiatry

## 2017-06-30 ENCOUNTER — Ambulatory Visit
Admission: RE | Admit: 2017-06-30 | Discharge: 2017-06-30 | Disposition: A | Discharge status: Home / Self Care | Payer: Medicare Other | Source: Ambulatory Visit | Attending: Internal Medicine | Admitting: Internal Medicine

## 2017-06-30 ENCOUNTER — Encounter: Payer: Self-pay | Admitting: Podiatry

## 2017-06-30 DIAGNOSIS — M79676 Pain in unspecified toe(s): Secondary | ICD-10-CM | POA: Diagnosis not present

## 2017-06-30 DIAGNOSIS — M5136 Other intervertebral disc degeneration, lumbar region: Secondary | ICD-10-CM

## 2017-06-30 DIAGNOSIS — B351 Tinea unguium: Secondary | ICD-10-CM

## 2017-06-30 DIAGNOSIS — E0842 Diabetes mellitus due to underlying condition with diabetic polyneuropathy: Secondary | ICD-10-CM

## 2017-06-30 NOTE — Progress Notes (Signed)
Patient ID: Amy [name], female   DOB: 08/31/1960, 56 y.o.   MRN: [id number] Complaint:  Visit Type: Patient returns to my office for continued preventative foot care services. Complaint: Patient states" my nails have grown long and thick and become painful to walk and wear shoes" Patient has been diagnosed with DM with neuropathy.. The patient presents for preventative foot care services. No changes to ROS  Podiatric Exam: Vascular: dorsalis pedis and posterior tibial pulses are palpable bilateral. Capillary return is immediate. Temperature gradient is WNL. Skin turgor WNL  Sensorium: Normal Semmes Weinstein monofilament test. Normal tactile sensation bilaterally. Nail Exam: Pt has thick disfigured discolored nails with subungual debris noted bilateral entire nail hallux through fifth toenails Ulcer Exam: There is no evidence of ulcer or pre-ulcerative changes or infection. Orthopedic Exam: Muscle tone and strength are WNL. No limitations in general ROM. No crepitus or effusions noted. Foot type and digits show no abnormalities. Bony prominences are unremarkable. Skin: No Porokeratosis. No infection or ulcers.  Asymptomatic plantar fibroma left arch.  Diagnosis:  Onychomycosis, , Pain in right toe, pain in left toes  Treatment & Plan Procedures and Treatment: Consent by patient was obtained for treatment procedures. The patient understood the discussion of treatment and procedures well. All questions were answered thoroughly reviewed. Debridement of mycotic and hypertrophic toenails, 1 through 5 bilateral and clearing of subungual debris. No ulceration, no infection noted.  Return Visit-Office Procedure: Patient instructed to return to the office for a follow up visit 10 weeks  for continued evaluation and treatment.    Kalan Rinn DPM 

## 2017-07-05 ENCOUNTER — Encounter (HOSPITAL_COMMUNITY): Payer: Medicare Other

## 2017-07-09 ENCOUNTER — Telehealth: Payer: Self-pay | Admitting: Physician Assistant

## 2017-07-09 NOTE — Telephone Encounter (Signed)
Paged by answering service. The patient has gained 9 lb (116-->225lb) in past 4 days. She has dyspnea, LE edema and orthopnea. She is treated with abx last week for possible bronchitis. Minimal improved. Still has cough and congestion without fever. BS of 450 this morning. ADvised to call medical doctor for cough and high blood suger.   She will take metolazone with supplemental possible as instructed today. She will monitor her symptoms  and will call us tomorrow morning. She has appointment in CHF clinic 07/13/17. Continue Torsemide.

## 2017-07-13 ENCOUNTER — Ambulatory Visit (HOSPITAL_COMMUNITY)
Admission: RE | Admit: 2017-07-13 | Discharge: 2017-07-13 | Disposition: A | Discharge status: Home / Self Care | Payer: Medicare Other | Source: Ambulatory Visit | Attending: Internal Medicine | Admitting: Internal Medicine

## 2017-07-13 ENCOUNTER — Encounter (HOSPITAL_COMMUNITY): Payer: Self-pay

## 2017-07-13 VITALS — BP 100/62 | HR 68 | Wt 218.2 lb

## 2017-07-13 DIAGNOSIS — N183 Chronic kidney disease, stage 3 unspecified: Secondary | ICD-10-CM

## 2017-07-13 DIAGNOSIS — G4733 Obstructive sleep apnea (adult) (pediatric): Secondary | ICD-10-CM

## 2017-07-13 DIAGNOSIS — Z7982 Long term (current) use of aspirin: Secondary | ICD-10-CM | POA: Diagnosis not present

## 2017-07-13 DIAGNOSIS — Z794 Long term (current) use of insulin: Secondary | ICD-10-CM | POA: Insufficient documentation

## 2017-07-13 DIAGNOSIS — Z79899 Other long term (current) drug therapy: Secondary | ICD-10-CM | POA: Insufficient documentation

## 2017-07-13 DIAGNOSIS — Z880 Allergy status to penicillin: Secondary | ICD-10-CM | POA: Diagnosis not present

## 2017-07-13 DIAGNOSIS — Z9989 Dependence on other enabling machines and devices: Secondary | ICD-10-CM

## 2017-07-13 DIAGNOSIS — E1122 Type 2 diabetes mellitus with diabetic chronic kidney disease: Secondary | ICD-10-CM | POA: Diagnosis not present

## 2017-07-13 DIAGNOSIS — Z87891 Personal history of nicotine dependence: Secondary | ICD-10-CM | POA: Insufficient documentation

## 2017-07-13 DIAGNOSIS — Z882 Allergy status to sulfonamides status: Secondary | ICD-10-CM | POA: Insufficient documentation

## 2017-07-13 DIAGNOSIS — I130 Hypertensive heart and chronic kidney disease with heart failure and stage 1 through stage 4 chronic kidney disease, or unspecified chronic kidney disease: Secondary | ICD-10-CM | POA: Insufficient documentation

## 2017-07-13 DIAGNOSIS — Z885 Allergy status to narcotic agent status: Secondary | ICD-10-CM | POA: Insufficient documentation

## 2017-07-13 DIAGNOSIS — Z888 Allergy status to other drugs, medicaments and biological substances status: Secondary | ICD-10-CM | POA: Insufficient documentation

## 2017-07-13 DIAGNOSIS — I10 Essential (primary) hypertension: Secondary | ICD-10-CM

## 2017-07-13 DIAGNOSIS — I5032 Chronic diastolic (congestive) heart failure: Secondary | ICD-10-CM | POA: Diagnosis not present

## 2017-07-13 LAB — BASIC METABOLIC PANEL
ANION GAP: 12 (ref 5–15)
BUN: 47 mg/dL — ABNORMAL HIGH (ref 6–20)
CHLORIDE: 96 mmol/L — AB (ref 101–111)
CO2: 31 mmol/L (ref 22–32)
Calcium: 9.8 mg/dL (ref 8.9–10.3)
Creatinine, Ser: 1.55 mg/dL — ABNORMAL HIGH (ref 0.44–1.00)
GFR calc Af Amer: 42 mL/min — ABNORMAL LOW (ref >60)
GFR, EST NON AFRICAN AMERICAN: 36 mL/min — AB (ref >60)
GLUCOSE: 244 mg/dL — AB (ref 65–99)
POTASSIUM: 3.6 mmol/L (ref 3.5–5.1)
Sodium: 139 mmol/L (ref 135–145)

## 2017-07-13 LAB — BRAIN NATRIURETIC PEPTIDE: B Natriuretic Peptide: 65.9 pg/mL (ref 0.0–100.0)

## 2017-07-13 MED ORDER — POTASSIUM CHLORIDE CRYS ER 20 MEQ PO TBCR: EXTENDED_RELEASE_TABLET | ORAL | 3 refills | Status: DC

## 2017-07-13 NOTE — Progress Notes (Signed)
ADVANCED HF CLINIC CONSULT NOTE  Referring Physician: Dr.Berry Primary Cardiologist: Dr. Gwenlyn Found  PCP: Dr. Ardeth Perfect  HPI: Amy [name] is a 57 y.o. female with obesity, HTN, diabetes with legal blindness, HL, OSA and diastolic HF.  She is referred by Dr. Gwenlyn Found for further evaluation and management of diastolic HF.   She had a negative Myoview 10/10/14 and a 2-D echo performed 07/06/15 that revealed normal LV/RV function and grade 2 diastolic dysfunction.  Today she returns for HF follow up. Last visit torsemide was increased to 40 mg twice a day. Overall feeling ok. SOB with exertion. Ongoing leg edema.  Denies PND/Orthopnea. Appetite ok. Eating high salt foods such as pizza and hot dogs.  No fever or chills. Weight at home 216-225 pounds. Followed by [facility]. Taking all medications.  Review of systems complete and found to be negative unless listed in HPI.    Past Medical History:  Diagnosis Date  . Asthma   . Blind   . Blindness and low vision    left eye glass eye,  legally blind in right eye  . Diabetes mellitus   . Diabetic neuropathy ([place])   . Hyperlipidemia   . Hypertension   . Hypothyroidism   . Spinal stenosis     Current Outpatient Medications  Medication Sig Dispense Refill  . acetaminophen (TYLENOL) 325 MG tablet Take 2 tablets (650 mg total) by mouth every 6 (six) hours as needed for mild pain (or Fever >/= 101). 30 tablet 0  . albuterol (PROVENTIL HFA;VENTOLIN HFA) 108 (90 BASE) MCG/ACT inhaler Inhale 2 puffs into the lungs every 4 (four) hours as needed for wheezing or shortness of breath. For short    . aspirin EC 81 MG tablet Take 81 mg by mouth daily.    . cetirizine (ZYRTEC) 10 MG tablet Take 10 mg by mouth daily.    . cyclobenzaprine (FLEXERIL) 10 MG tablet 10 mg as needed.    . Diclofenac Sodium 1 % CREA Place 1 Squirt onto the skin 3 (three) times daily as needed. Apply to left shoulder 120 g 0  . esomeprazole (NEXIUM) 40 MG capsule Take 40 mg by mouth  2 (two) times daily before a meal.     . fluorometholone (FML FORTE) 0.25 % ophthalmic suspension Place 1 drop into the right eye 2 (two) times daily.    . fluticasone (FLONASE) 50 MCG/ACT nasal spray Place into both nostrils daily.    Marland Kitchen HUMALOG KWIKPEN 100 UNIT/ML KiwkPen     . insulin degludec (TRESIBA FLEXTOUCH) 100 UNIT/ML SOPN FlexTouch Pen Inject 60 Units into the skin daily at 10 pm.    . levothyroxine (SYNTHROID, LEVOTHROID) 150 MCG tablet Take 150 mcg by mouth daily before breakfast.    . Linaclotide (LINZESS) 145 MCG CAPS capsule Take 145 mcg by mouth daily as needed (constipation).     Marland Kitchen LYRICA 150 MG capsule Take 150 mg by mouth 3 (three) times daily.    . metolazone (ZAROXOLYN) 5 MG tablet Take 1 tablet (5 mg total) by mouth as needed. Take for wt gain of 3lbs overnight or 5 lbs in a week. 30 tablet 2  . ondansetron (ZOFRAN) 4 MG tablet 4 mg. As needed    . potassium chloride SA (K-DUR,KLOR-CON) 20 MEQ tablet Take 1 tablet (20 mEq total) by mouth 2 (two) times daily AND 1 tablet (20 mEq total) once a week. With METOLAZONE. 65 tablet 3  . spironolactone (ALDACTONE) 25 MG tablet Take 0.5 tablets (  12.5 mg total) by mouth daily. 45 tablet 3  . torsemide (DEMADEX) 20 MG tablet Take 2 tablets (40 mg total) by mouth 2 (two) times daily. 120 tablet 11  . traMADol (ULTRAM) 50 MG tablet Take 1 tablet (50 mg total) by mouth every 6 (six) hours as needed. 15 tablet 0  . triamcinolone cream (KENALOG) 0.1 % Apply 1 application topically 2 (two) times daily.    Marland Kitchen UNKNOWN TO PATIENT Diabetic ned, takes 60 units at bedtime     No current facility-administered medications for this encounter.     Allergies  Allergen Reactions  . Penicillins Hives and Swelling    Has patient had a PCN reaction causing immediate rash, facial/tongue/throat swelling, SOB or lightheadedness with hypotension: yes- face swelling Has patient had a PCN reaction causing severe rash involving mucus membranes or skin necrosis:  no Has patient had a PCN reaction that required hospitalization unknown (childhood allergy) Has patient had a PCN reaction occurring within the last 10 years: no If all of the above answers are "NO", then may proceed with Cephalosporin use.   . Iohexol Hives    pt developed itching and hives along with nasal congestion; needs 13 hour premeds for future studies, Onset Date: [id number]   . Codeine Nausea Only  . Iodine-131 Hives    hives  . Lisinopril Cough  . Sulfa Antibiotics Nausea And Vomiting      Social History   Socioeconomic History  . Marital status: Single    Spouse name: Not on file  . Number of children: 2  . Years of education: 73  . Highest education level: Not on file  Social Needs  . Financial resource strain: Not on file  . Food insecurity - worry: Not on file  . Food insecurity - inability: Not on file  . Transportation needs - medical: Not on file  . Transportation needs - non-medical: Not on file  Occupational History  . Occupation: N/A    Comment: disabled  Tobacco Use  . Smoking status: Former Smoker    Types: Cigarettes    Last attempt to quit: 05/22/1992    Years since quitting: 25.1  . Smokeless tobacco: Never Used  Substance and Sexual Activity  . Alcohol use: No    Comment: quit 20 yrs ago  . Drug use: No  . Sexual activity: Not Currently  Other Topics Concern  . Not on file  Social History Narrative   Lives alone   caffeine drinks 2 cups of coffee a day, occasional soda    Family History  Problem Relation Age of Onset  . Diabetes Sister   . Glaucoma Sister   . Hypertension Sister   . Stroke Brother   . Heart attack Paternal Aunt    Vitals:   07/13/17 1109  BP: 100/62  Pulse: 68  SpO2: 92%  Weight: 218 lb 3.2 oz (99 kg)   Wt Readings from Last 3 Encounters:  07/13/17 218 lb 3.2 oz (99 kg)  06/20/17 214 lb 12.8 oz (97.4 kg)  04/17/17 208 lb 12.8 oz (94.7 kg)    PHYSICAL EXAM: General:  Appears chronically ill.  No resp  difficulty. Arrived in a wheel chair.  HEENT: normal Neck: supple. JVP 10-11 . Carotids 2+ bilat; no bruits. No lymphadenopathy or thryomegaly appreciated. Cor: PMI nondisplaced. Regular rate & rhythm. No rubs, gallops or murmurs. Lungs: clear Abdomen: soft, nontender, distended. No hepatosplenomegaly. No bruits or masses. Good bowel sounds. Extremities: no cyanosis, clubbing, rash, R  and LLE 1+ edema Neuro: alert & orientedx3, cranial nerves grossly intact. moves all 4 extremities w/o difficulty. Affect pleasant    ASSESSMENT & PLAN: 1. Chronic diastolic HF - Echo 3/41 Normal LV/R function Grade II DD NYHA III. Volume status elevated in the setting of diet and increased fluid intake.  Continue torsemide 40 mg twice a day and she will take 5 mg metolazone for the next 2 days.  Increase potassium to 40 meq daily.  - Continue spiro 12.5 mg daily.  2. HTN -Stable.   3. DM2 - Per PCP.  - With HF would strongly consider Jardiance  4. OSA  - Continue CPAP qhs.   5. CKD III Check BMEt today.   Greater than 50% of the (total minutes 25) visit spent in counseling/coordination of care regarding heart failure, low salt diet, and limiting fluid intake to < 2 liters per day.     Follow up 2 weeks.   Darrick Grinder, NP  11:07 AM

## 2017-07-13 NOTE — Patient Instructions (Addendum)
Take Metolazone 5mg , one tab daily for the next two days. Increase Potassium to 40 MEQ daily    Labs today We will only contact you if something comes back abnormal or we need to make some changes. Otherwise no news is good news!  Your physician recommends that you schedule a follow-up appointment in: 2 weeks with Amy Clegg,NP-C   Do the following things EVERYDAY: 1) Weigh yourself in the morning before breakfast. Write it down and keep it in a log. 2) Take your medicines as prescribed 3) Eat low salt foods-Limit salt (sodium) to 2000 mg per day.  4) Stay as active as you can everyday 5) Limit all fluids for the day to less than 2 liters

## 2017-07-17 IMAGING — CT CT ABD-PELV W/O CM
1 of 4 series · 6 of 46 positions shown, 11 images · non-contrast
Comparison: CT 08/13/2011, abdominal MRI 07/03/2006

CLINICAL DATA: Right lower quadrant abdominal pain for 3 days.
Nausea.

EXAM:
CT ABDOMEN AND PELVIS WITHOUT CONTRAST
TECHNIQUE: Multidetector CT imaging of the abdomen and pelvis was performed
following the standard protocol without IV contrast. Patient with
history of IV contrast allergy. Some enteric contrast was
administered.

[Series 207: (id) · axial · 0.88mm/px · z∈[+352,+452]mm · 6 of 28 slices shown, 11 images]
[im 4/28  soft-tissue]
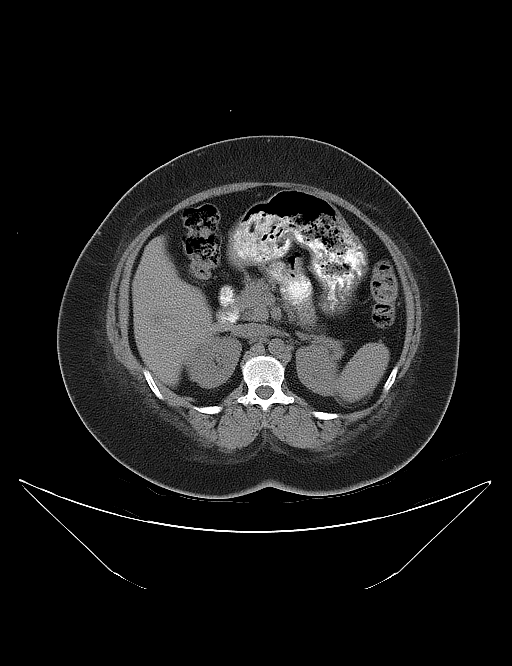
[im 4/28  bone]
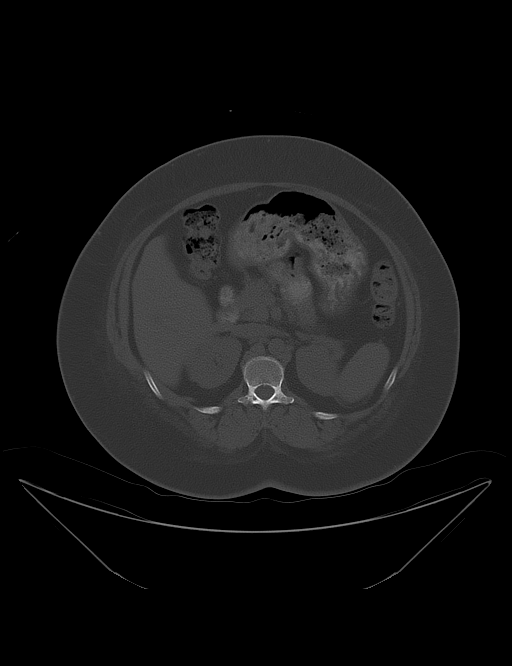
[im 8/28  soft-tissue]
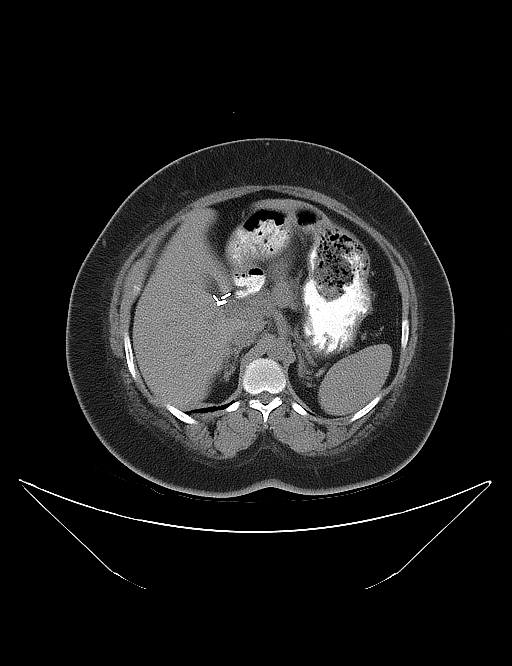
[im 12/28  soft-tissue]
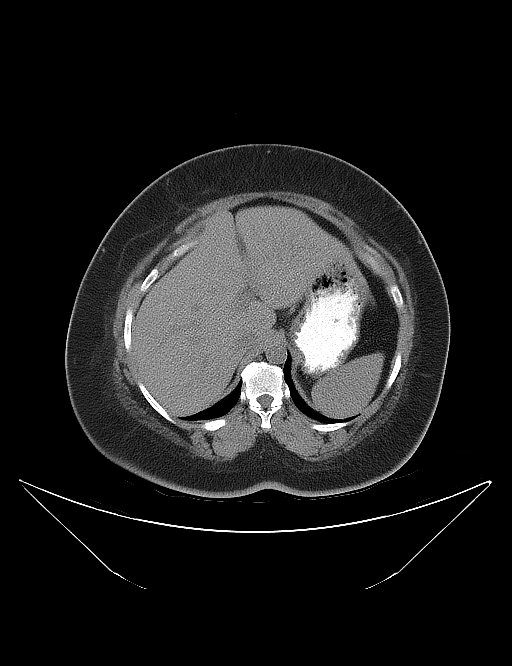
[im 12/28  lung]
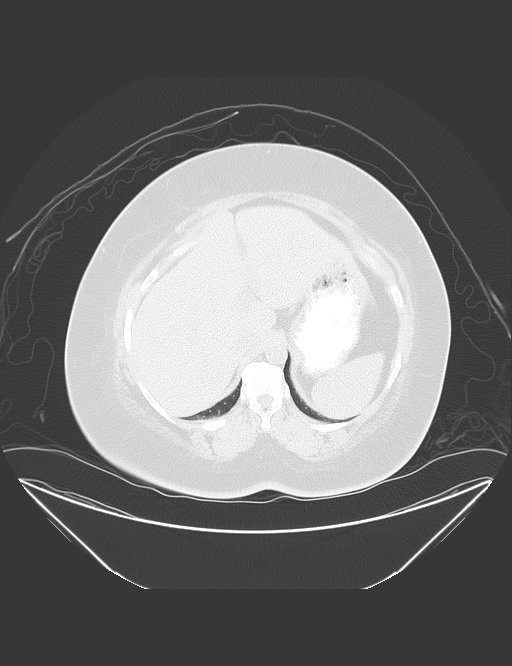
[im 16/28  soft-tissue]
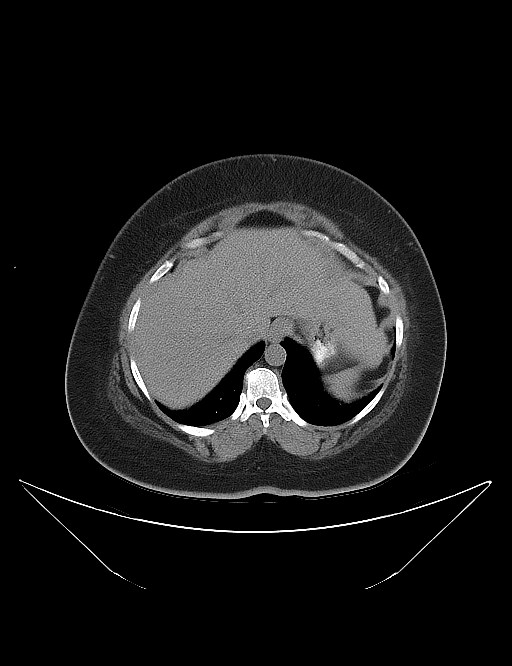
[im 16/28  lung]
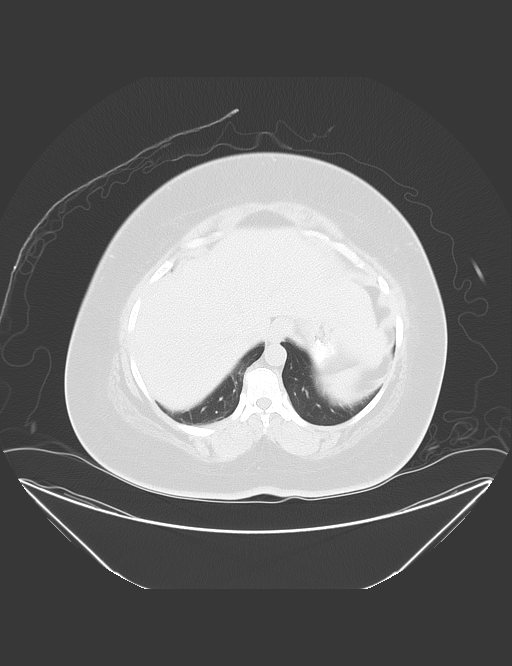
[im 20/28  soft-tissue]
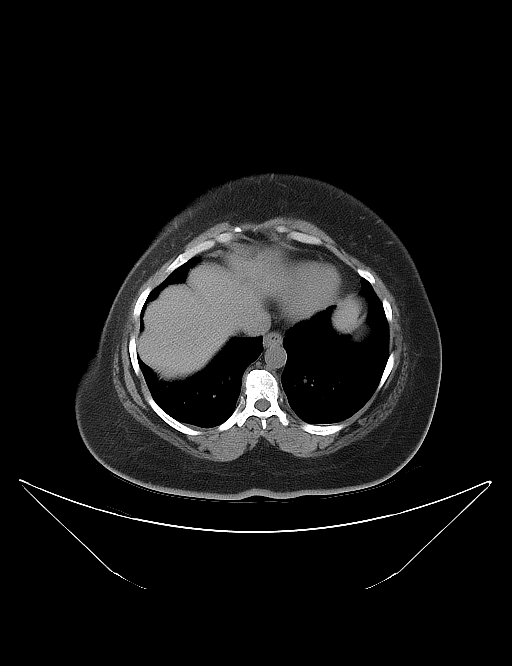
[im 20/28  lung]
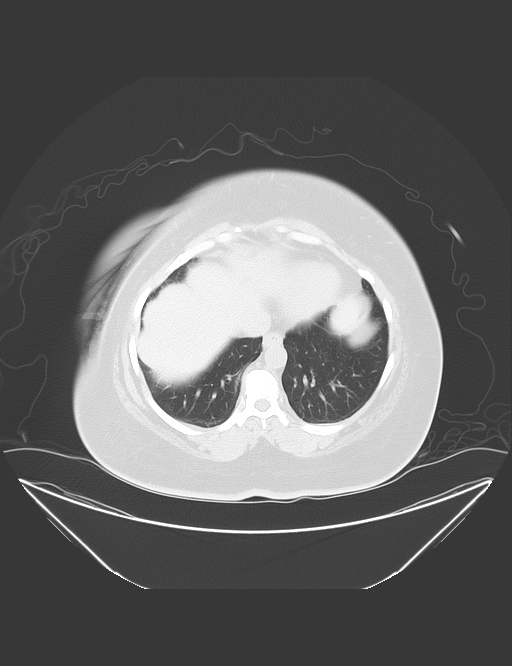
[im 24/28  soft-tissue]
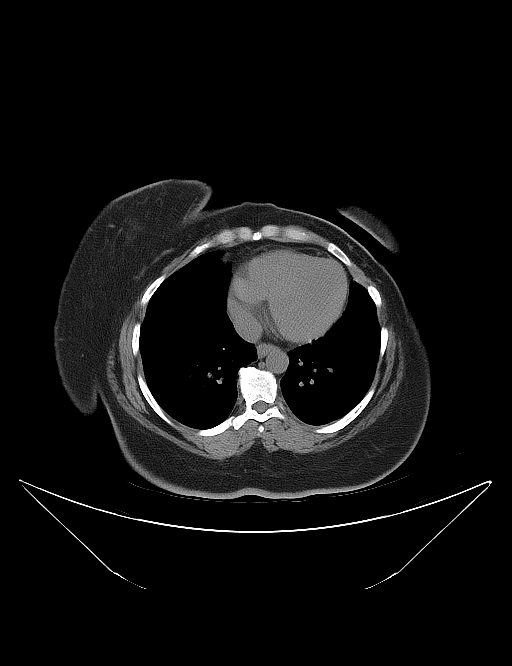
[im 24/28  lung]
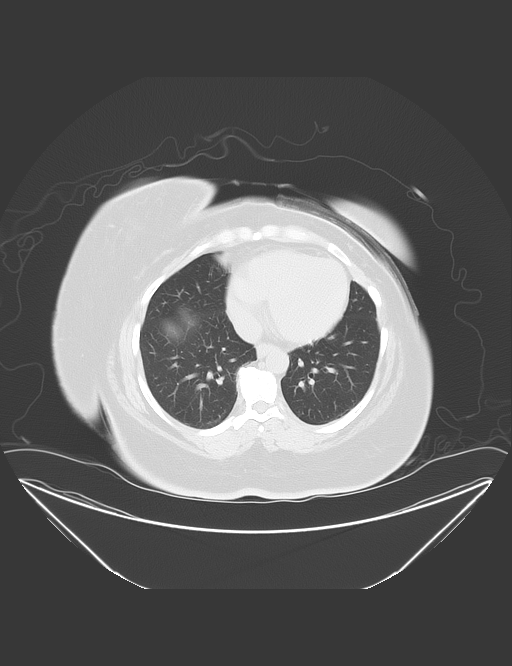

[6 of 46 positions shown; findings below may reference images not displayed]

FINDINGS: Lower chest:  The included lung bases are clear.

Liver: Hypodense lesion in the right lobe measures 2.1 x 1.1 cm,
unchanged to decreased in size from prior exam. Differences in
caliper placement likely secondary to ill-defined borders given lack
contrast. Previous MR characterization consistent with hemangioma.
No new hepatic lesion.

Hepatobiliary: Clips in the gallbladder fossa postcholecystectomy.
No biliary dilatation.

Pancreas: No ductal dilatation or inflammation.

Spleen: Normal.

Adrenal glands: No nodule.

Kidneys: Symmetric in size without stones or hydronephrosis.
Partially duplicated right renal collecting system. There is no
perinephric stranding. Both ureters are decompressed without stones
along the course.

Stomach/Bowel: Stomach physiologically distended. There are no
dilated or thickened small bowel loops. Small volume of stool
throughout the colon without colonic wall thickening. The appendix
is normal.

Vascular/Lymphatic: No retroperitoneal adenopathy. Abdominal aorta
is normal in caliber. Mild atherosclerosis of the abdominal aorta
and its branches without aneurysm.

Reproductive: Uterus is surgically absent.  No adnexal mass.

Bladder: Physiologically distended. Equivocal bladder wall
thickening that appears chronic and similar to prior exam.

Other: No free air, free fluid, or intra-abdominal fluid collection.
Soft tissue density in the anterior abdominal wall subcutaneous
tissues, commonly related to subcutaneous injections. No fluid
collection.

Musculoskeletal: There are no acute or suspicious osseous
abnormalities.
IMPRESSION: No acute abnormality in the abdomen/pelvis.

Equivocal wall thickening of the urinary bladder, appear similar to
prior exam and likely chronic.

## 2017-07-26 NOTE — Progress Notes (Signed)
[facility]  Referring Physician: Dr.Berry Primary Cardiologist: Dr. Gwenlyn Found  PCP: Dr. Ardeth Perfect HF MD: Dr Haroldine Laws  HPI: Amy [name] is a 57 y.o. female with obesity, HTN, diabetes with legal blindness, HL, OSA and diastolic HF.  She is referred by Dr. Gwenlyn Found for further evaluation and management of diastolic HF.   She had a negative Myoview 10/10/14 and a 2-D echo performed 07/06/15 that revealed normal LV/RV function and grade 2 diastolic dysfunction.  Today she returns for HF follow up. Last visit she was volume overloaded and instructed to take metolazone for 2 days. Overall feeling better. Only complaint today is back pain. Mild dyspnea with exertion. Denies  PND/Orthopnea. Appetite ok. No fever or chills. Taking all medications. Medications placed in a pill box by [facility].    Review of systems complete and found to be negative unless listed in HPI.    Past Medical History:  Diagnosis Date  . Asthma   . Blind   . Blindness and low vision    left eye glass eye,  legally blind in right eye  . Diabetes mellitus   . Diabetic neuropathy ([place])   . Hyperlipidemia   . Hypertension   . Hypothyroidism   . Spinal stenosis     Current Outpatient Medications  Medication Sig Dispense Refill  . acetaminophen (TYLENOL) 325 MG tablet Take 2 tablets (650 mg total) by mouth every 6 (six) hours as needed for mild pain (or Fever >/= 101). 30 tablet 0  . albuterol (PROVENTIL HFA;VENTOLIN HFA) 108 (90 BASE) MCG/ACT inhaler Inhale 2 puffs into the lungs every 4 (four) hours as needed for wheezing or shortness of breath. For short    . aspirin EC 81 MG tablet Take 81 mg by mouth daily.    . cetirizine (ZYRTEC) 10 MG tablet Take 10 mg by mouth daily.    . cyclobenzaprine (FLEXERIL) 10 MG tablet 10 mg as needed.    . Diclofenac Sodium 1 % CREA Place 1 Squirt onto the skin 3 (three) times daily as needed. Apply to left shoulder 120 g 0  . esomeprazole (NEXIUM) 40 MG capsule Take  40 mg by mouth 2 (two) times daily before a meal.     . fluorometholone (FML FORTE) 0.25 % ophthalmic suspension Place 1 drop into the right eye 2 (two) times daily.    . fluticasone (FLONASE) 50 MCG/ACT nasal spray Place into both nostrils daily.    Marland Kitchen HUMALOG KWIKPEN 100 UNIT/ML KiwkPen     . insulin degludec (TRESIBA FLEXTOUCH) 100 UNIT/ML SOPN FlexTouch Pen Inject 60 Units into the skin daily at 10 pm.    . levothyroxine (SYNTHROID, LEVOTHROID) 150 MCG tablet Take 150 mcg by mouth daily before breakfast.    . Linaclotide (LINZESS) 145 MCG CAPS capsule Take 145 mcg by mouth daily as needed (constipation).     Marland Kitchen LYRICA 150 MG capsule Take 150 mg by mouth 3 (three) times daily.    . metolazone (ZAROXOLYN) 5 MG tablet Take 1 tablet (5 mg total) by mouth as needed. Take for wt gain of 3lbs overnight or 5 lbs in a week. 30 tablet 2  . ondansetron (ZOFRAN) 4 MG tablet 4 mg. As needed    . potassium chloride SA (K-DUR,KLOR-CON) 20 MEQ tablet Take 2 tablets (40 mEq total) by mouth daily AND 1 tablet (20 mEq total) once a week. With METOLAZONE. 65 tablet 3  . spironolactone (ALDACTONE) 25 MG tablet Take 0.5 tablets (12.5 mg  total) by mouth daily. 45 tablet 3  . torsemide (DEMADEX) 20 MG tablet Take 2 tablets (40 mg total) by mouth 2 (two) times daily. 120 tablet 11  . traMADol (ULTRAM) 50 MG tablet Take 1 tablet (50 mg total) by mouth every 6 (six) hours as needed. 15 tablet 0  . triamcinolone cream (KENALOG) 0.1 % Apply 1 application topically 2 (two) times daily.    Marland Kitchen UNKNOWN TO PATIENT Diabetic ned, takes 60 units at bedtime     No current facility-administered medications for this encounter.     Allergies  Allergen Reactions  . Penicillins Hives and Swelling    Has patient had a PCN reaction causing immediate rash, facial/tongue/throat swelling, SOB or lightheadedness with hypotension: yes- face swelling Has patient had a PCN reaction causing severe rash involving mucus membranes or skin  necrosis: no Has patient had a PCN reaction that required hospitalization unknown (childhood allergy) Has patient had a PCN reaction occurring within the last 10 years: no If all of the above answers are "NO", then may proceed with Cephalosporin use.   . Iohexol Hives    pt developed itching and hives along with nasal congestion; needs 13 hour premeds for future studies, Onset Date: [id number]   . Codeine Nausea Only  . Iodine-131 Hives    hives  . Lisinopril Cough  . Sulfa Antibiotics Nausea And Vomiting      Social History   Socioeconomic History  . Marital status: Single    Spouse name: Not on file  . Number of children: 2  . Years of education: 95  . Highest education level: Not on file  Social Needs  . Financial resource strain: Not on file  . Food insecurity - worry: Not on file  . Food insecurity - inability: Not on file  . Transportation needs - medical: Not on file  . Transportation needs - non-medical: Not on file  Occupational History  . Occupation: N/A    Comment: disabled  Tobacco Use  . Smoking status: Former Smoker    Types: Cigarettes    Last attempt to quit: 05/22/1992    Years since quitting: 25.1  . Smokeless tobacco: Never Used  Substance and Sexual Activity  . Alcohol use: No    Comment: quit 20 yrs ago  . Drug use: No  . Sexual activity: Not Currently  Other Topics Concern  . Not on file  Social History Narrative   Lives alone   caffeine drinks 2 cups of coffee a day, occasional soda    Family History  Problem Relation Age of Onset  . Diabetes Sister   . Glaucoma Sister   . Hypertension Sister   . Stroke Brother   . Heart attack Paternal Aunt    Vitals:   07/27/17 1042  BP: 108/72  Pulse: 71  SpO2: 97%  Weight: 213 lb 9.6 oz (96.9 kg)   Wt Readings from Last 3 Encounters:  07/27/17 213 lb 9.6 oz (96.9 kg)  07/13/17 218 lb 3.2 oz (99 kg)  06/20/17 214 lb 12.8 oz (97.4 kg)    PHYSICAL EXAM: General:  Appears chronically ill.  No  resp difficulty. Arrived in a wheel chair.  HEENT: normal Neck: supple. JVP 5-6 . Carotids 2+ bilat; no bruits. No lymphadenopathy or thryomegaly appreciated. Cor: PMI nondisplaced. Regular rate & rhythm. No rubs, gallops or murmurs. Lungs: clear Abdomen: soft, nontender, nondistended. No hepatosplenomegaly. No bruits or masses. Good bowel sounds. Extremities: no cyanosis, clubbing, rash, edema Neuro: alert &  orientedx3, cranial nerves grossly intact. moves all 4 extremities w/o difficulty. Affect pleasant   ASSESSMENT & PLAN: 1. Chronic diastolic HF - Echo 2/54 Normal LV/R function Grade II DD NYHA II-III.Marland Kitchen  Volume status improved. Continue torsemide 40 mg twice with an extra 20 mg torsemide for 3 pound weight gain. Discussed sliding scale diuretic regimen.  - Continue spiro 12.5 mg daily.  - Reinforced daily weights, low salt food choices, and limiting fluid intake to < 2 liters per day.  2. HTN -Stable.   3. DM2 - Per PCP.  - With HF would strongly consider Jardiance 4. OSA  - Continue CPAP qhs.  5. CKD III Check BMEt today.   Follow up in 2 months. Discussed sliding scale diuretics and daily weights.    Darrick Grinder, NP  10:46 AM

## 2017-07-27 ENCOUNTER — Ambulatory Visit (HOSPITAL_COMMUNITY)
Admission: RE | Admit: 2017-07-27 | Discharge: 2017-07-27 | Disposition: A | Discharge status: Home / Self Care | Payer: Medicare Other | Source: Ambulatory Visit | Attending: Internal Medicine | Admitting: Internal Medicine

## 2017-07-27 VITALS — BP 108/72 | HR 71 | Wt 213.6 lb

## 2017-07-27 DIAGNOSIS — Z7951 Long term (current) use of inhaled steroids: Secondary | ICD-10-CM | POA: Insufficient documentation

## 2017-07-27 DIAGNOSIS — E1122 Type 2 diabetes mellitus with diabetic chronic kidney disease: Secondary | ICD-10-CM | POA: Diagnosis not present

## 2017-07-27 DIAGNOSIS — N183 Chronic kidney disease, stage 3 unspecified: Secondary | ICD-10-CM

## 2017-07-27 DIAGNOSIS — E785 Hyperlipidemia, unspecified: Secondary | ICD-10-CM | POA: Insufficient documentation

## 2017-07-27 DIAGNOSIS — Z7982 Long term (current) use of aspirin: Secondary | ICD-10-CM | POA: Diagnosis not present

## 2017-07-27 DIAGNOSIS — Z87891 Personal history of nicotine dependence: Secondary | ICD-10-CM | POA: Insufficient documentation

## 2017-07-27 DIAGNOSIS — Z79899 Other long term (current) drug therapy: Secondary | ICD-10-CM | POA: Insufficient documentation

## 2017-07-27 DIAGNOSIS — E1140 Type 2 diabetes mellitus with diabetic neuropathy, unspecified: Secondary | ICD-10-CM | POA: Diagnosis not present

## 2017-07-27 DIAGNOSIS — Z794 Long term (current) use of insulin: Secondary | ICD-10-CM | POA: Diagnosis not present

## 2017-07-27 DIAGNOSIS — H548 Legal blindness, as defined in USA: Secondary | ICD-10-CM | POA: Insufficient documentation

## 2017-07-27 DIAGNOSIS — I130 Hypertensive heart and chronic kidney disease with heart failure and stage 1 through stage 4 chronic kidney disease, or unspecified chronic kidney disease: Secondary | ICD-10-CM | POA: Insufficient documentation

## 2017-07-27 DIAGNOSIS — I10 Essential (primary) hypertension: Secondary | ICD-10-CM

## 2017-07-27 DIAGNOSIS — G4733 Obstructive sleep apnea (adult) (pediatric): Secondary | ICD-10-CM

## 2017-07-27 DIAGNOSIS — E669 Obesity, unspecified: Secondary | ICD-10-CM | POA: Diagnosis not present

## 2017-07-27 DIAGNOSIS — J45909 Unspecified asthma, uncomplicated: Secondary | ICD-10-CM | POA: Diagnosis not present

## 2017-07-27 DIAGNOSIS — E039 Hypothyroidism, unspecified: Secondary | ICD-10-CM | POA: Insufficient documentation

## 2017-07-27 DIAGNOSIS — Z9989 Dependence on other enabling machines and devices: Secondary | ICD-10-CM | POA: Diagnosis not present

## 2017-07-27 DIAGNOSIS — I5032 Chronic diastolic (congestive) heart failure: Secondary | ICD-10-CM | POA: Diagnosis present

## 2017-07-27 LAB — BASIC METABOLIC PANEL
ANION GAP: 13 (ref 5–15)
BUN: 79 mg/dL — ABNORMAL HIGH (ref 6–20)
CHLORIDE: 95 mmol/L — AB (ref 101–111)
CO2: 33 mmol/L — ABNORMAL HIGH (ref 22–32)
Calcium: 9.7 mg/dL (ref 8.9–10.3)
Creatinine, Ser: 2.05 mg/dL — ABNORMAL HIGH (ref 0.44–1.00)
GFR calc non Af Amer: 26 mL/min — ABNORMAL LOW (ref >60)
GFR, EST AFRICAN AMERICAN: 30 mL/min — AB (ref >60)
Glucose, Bld: 118 mg/dL — ABNORMAL HIGH (ref 65–99)
POTASSIUM: 3.6 mmol/L (ref 3.5–5.1)
SODIUM: 141 mmol/L (ref 135–145)

## 2017-07-27 MED ORDER — TORSEMIDE 20 MG PO TABS: 40.0000 mg | ORAL_TABLET | Freq: Two times a day (BID) | ORAL | 11 refills | Status: DC

## 2017-07-27 NOTE — Patient Instructions (Addendum)
Continue taking torsemide 40 mg (2 tabs) twice daily. May take extra 20mg  tab once daily AS NEEDED for weight gain 3 lbs in 24 hrs.  Routine lab work today. Will notify you of abnormal results, otherwise no news is good news!  Follow up 8 weeks with Amy Clegg NP-C.  _________________________________________________________________ Amy [name] Code:   Take all medication as prescribed the day of your appointment. Bring all medications with you to your appointment.  Do the following things EVERYDAY: 1) Weigh yourself in the morning before breakfast. Write it down and keep it in a log. 2) Take your medicines as prescribed 3) Eat low salt foods-Limit salt (sodium) to 2000 mg per day.  4) Stay as active as you can everyday 5) Limit all fluids for the day to less than 2 liters

## 2017-07-29 DIAGNOSIS — M5136 Other intervertebral disc degeneration, lumbar region: Secondary | ICD-10-CM | POA: Insufficient documentation

## 2017-08-02 ENCOUNTER — Telehealth (HOSPITAL_COMMUNITY): Payer: Self-pay | Admitting: Cardiology

## 2017-08-02 NOTE — Telephone Encounter (Signed)
-----   Message from Conrad [place], NP sent at 07/27/2017  1:01 PM EDT ----- Please call. Hold torsemide and potassium for 2 days. Then start torsemide 40 mg daily and 20 meq potassium. Repeat BMET in 1 week.

## 2017-08-03 ENCOUNTER — Telehealth (HOSPITAL_COMMUNITY): Payer: Self-pay | Admitting: Cardiology

## 2017-08-03 DIAGNOSIS — I5032 Chronic diastolic (congestive) heart failure: Secondary | ICD-10-CM

## 2017-08-03 MED ORDER — TORSEMIDE 20 MG PO TABS: 40.0000 mg | ORAL_TABLET | Freq: Every day | ORAL | 11 refills | Status: DC

## 2017-08-03 MED ORDER — POTASSIUM CHLORIDE CRYS ER 20 MEQ PO TBCR: 20.0000 meq | EXTENDED_RELEASE_TABLET | Freq: Every day | ORAL | 3 refills | Status: DC

## 2017-08-03 NOTE — Telephone Encounter (Signed)
-----   Message from Conrad [place], NP sent at 07/27/2017  1:01 PM EDT ----- Please call. Hold torsemide and potassium for 2 days. Then start torsemide 40 mg daily and 20 meq potassium. Repeat BMET in 1 week.

## 2017-08-03 NOTE — Telephone Encounter (Signed)
Notes recorded by Kerry Dory, CMA on 08/02/2017 at 4:21 PM EDT Patient aware and voiced understanding ------  Notes recorded by Kerry Dory, CMA on 07/28/2017 at 3:23 PM EDT Line busy x 3 Unable to reach patient.  [phone] (H) ------  Notes recorded by Conrad [place], NP on 07/27/2017 at 1:01 PM EDT Please call. Hold torsemide and potassium for 2 days. Then start torsemide 40 mg daily and 20 meq potassium. Repeat BMET in 1 week.

## 2017-08-07 IMAGING — DX DG CHEST 1V PORT
1 series · 1 of 1 positions shown · non-contrast
Comparison: 09/14/2014

CLINICAL DATA: Pt presents via EMS from home with c/o weakness that
she reported started 4 days ago. Pt had a recent change in her BP
medication and her diabetic medication as well. Pt denies any N/V,
reports some dizziness.H/o asthma, diabetes, HTN. Former smoker.

EXAM:
PORTABLE CHEST 1 VIEW

[chest ap]
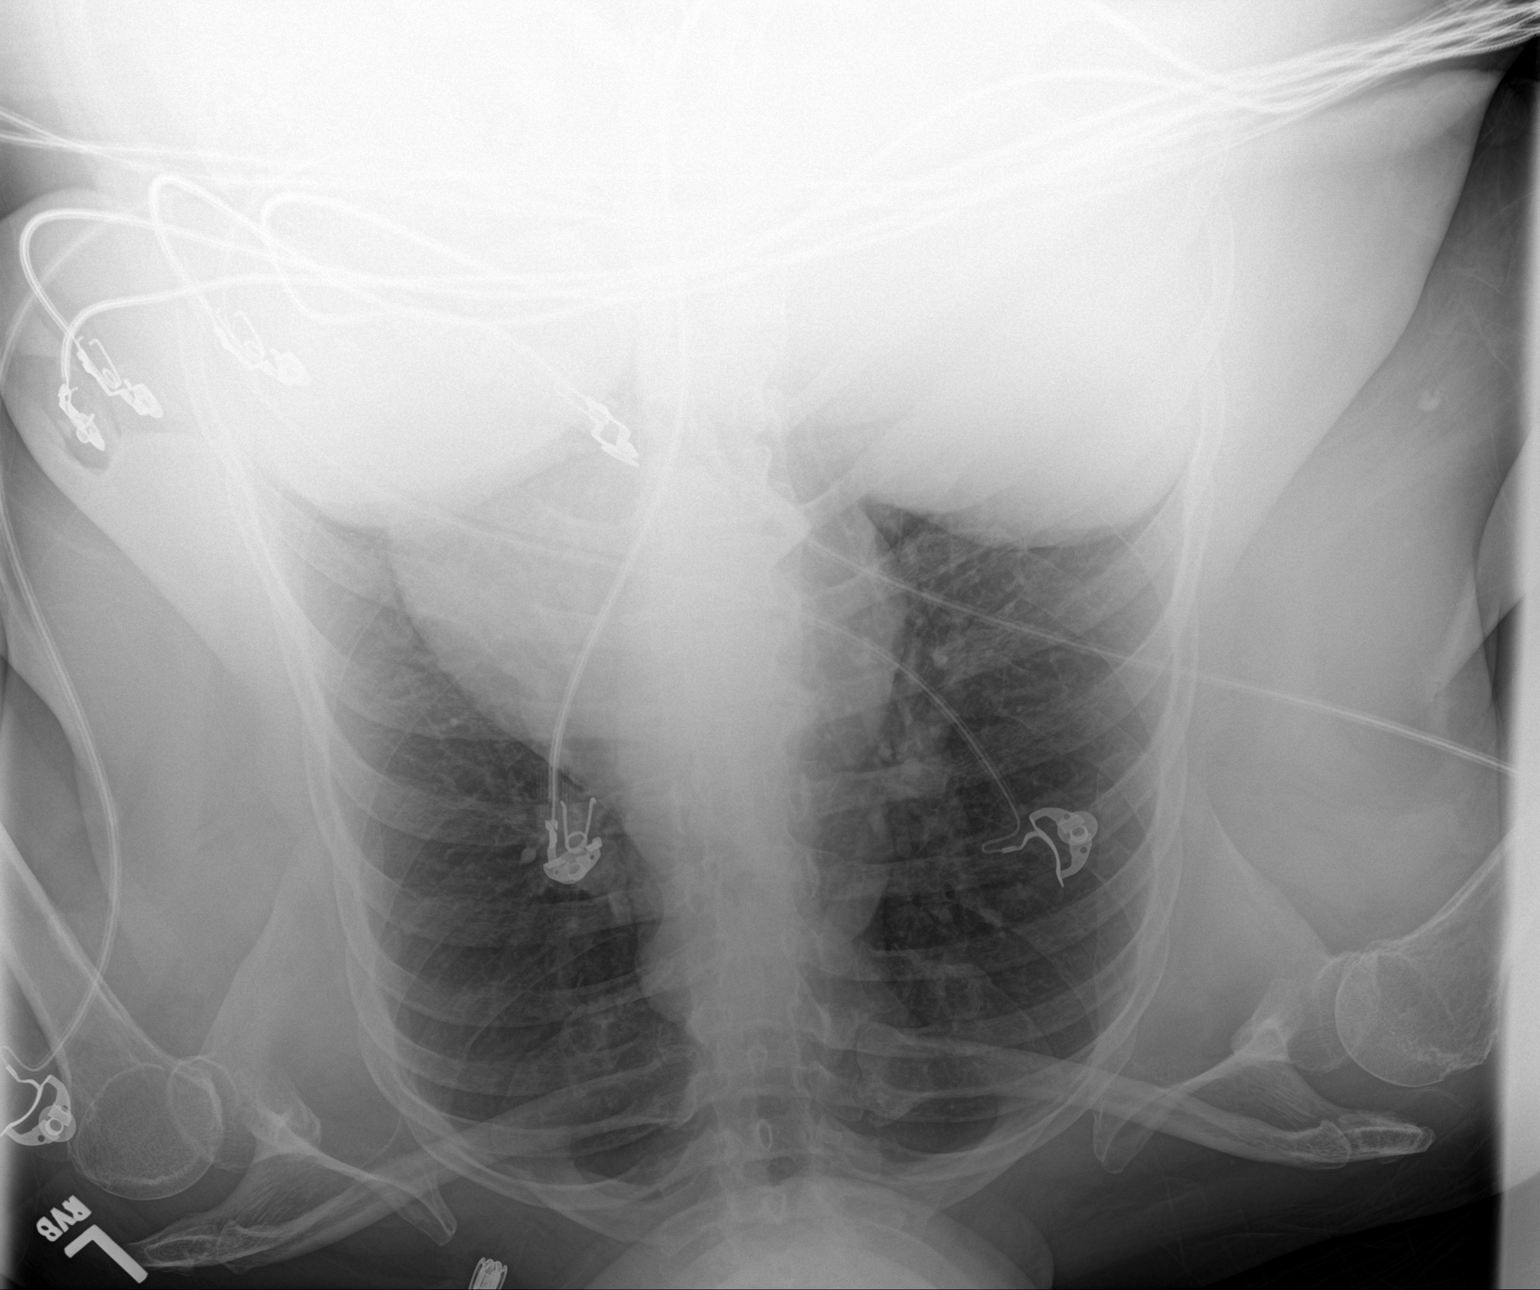

[1 of 1 positions shown; findings below may reference images not displayed]

FINDINGS: The heart size and mediastinal contours are within normal limits.
Both lungs are clear. No pleural effusion or pneumothorax. The bony
thorax is intact.
IMPRESSION: No active disease.

## 2017-08-09 ENCOUNTER — Ambulatory Visit: Payer: Medicare Other | Admitting: Nurse Practitioner

## 2017-08-11 ENCOUNTER — Other Ambulatory Visit (HOSPITAL_COMMUNITY): Payer: Medicare Other

## 2017-08-15 ENCOUNTER — Ambulatory Visit (HOSPITAL_COMMUNITY)
Admission: RE | Admit: 2017-08-15 | Discharge: 2017-08-15 | Disposition: A | Discharge status: Home / Self Care | Payer: Medicare Other | Source: Ambulatory Visit | Attending: Cardiology | Admitting: Cardiology

## 2017-08-15 DIAGNOSIS — I5032 Chronic diastolic (congestive) heart failure: Secondary | ICD-10-CM | POA: Diagnosis not present

## 2017-08-15 LAB — BASIC METABOLIC PANEL
ANION GAP: 11 (ref 5–15)
BUN: 37 mg/dL — ABNORMAL HIGH (ref 6–20)
CALCIUM: 9 mg/dL (ref 8.9–10.3)
CO2: 28 mmol/L (ref 22–32)
Chloride: 102 mmol/L (ref 101–111)
Creatinine, Ser: 1.6 mg/dL — ABNORMAL HIGH (ref 0.44–1.00)
GFR, EST AFRICAN AMERICAN: 40 mL/min — AB (ref >60)
GFR, EST NON AFRICAN AMERICAN: 34 mL/min — AB (ref >60)
Glucose, Bld: 123 mg/dL — ABNORMAL HIGH (ref 65–99)
POTASSIUM: 3.3 mmol/L — AB (ref 3.5–5.1)
SODIUM: 141 mmol/L (ref 135–145)

## 2017-08-18 ENCOUNTER — Telehealth (HOSPITAL_COMMUNITY): Payer: Self-pay | Admitting: Cardiology

## 2017-08-18 MED ORDER — POTASSIUM CHLORIDE CRYS ER 20 MEQ PO TBCR: 40.0000 meq | EXTENDED_RELEASE_TABLET | Freq: Every day | ORAL | 3 refills | Status: DC

## 2017-08-18 NOTE — Telephone Encounter (Signed)
Notes recorded by Kerry Dory, CMA on 08/18/2017 at 8:07 AM EDT Patient aware. [phone] (H) ------  Notes recorded by Conrad [place], NP on 08/16/2017 at 4:50 PM EDT Potassium low. Increase potassium to 40 meq daily.

## 2017-08-18 NOTE — Telephone Encounter (Signed)
-----   Message from Conrad [place], NP sent at 08/16/2017  4:50 PM EDT ----- Potassium low. Increase potassium to 40 meq daily.

## 2017-08-31 ENCOUNTER — Other Ambulatory Visit (HOSPITAL_COMMUNITY): Payer: Self-pay | Admitting: Student

## 2017-09-03 ENCOUNTER — Encounter: Payer: Self-pay | Admitting: Nurse Practitioner

## 2017-09-05 NOTE — Progress Notes (Addendum)
[facility]  [name] DOB: [birth year]   REASON FOR VISIT: Follow-up for obstructive sleep apnea on CPAP HISTORY FROM: patient alone at visit    HISTORY OF PRESENT ILLNESS:Dr. Dorthea Cove. 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 Leta Baptist in 9/17), and morbid obesity, who presents for follow up consultation of her sleep apnea, after recent sleep study testing. The patient is unaccompanied today. I first met her on 09/22/16, at the request of her PCP, at which time she reported snoring and excessive daytime somnolence. I invited her for a sleep study. She had a baseline sleep study, followed by a CPAP titration study. I went over her test results with her in detail today. Baseline sleep study from 10/02/2016 showed a sleep efficiency of 84.8%, sleep latency of 18.5 minutes, REM latency of 72.5 minutes, she had absence of slow-wave sleep, an increased percentage of stage II sleep, REM sleep at 16.8%. Total AHI was 6.8 per hour, REM AHI 28 per hour, supine sleep was nearly absent. Average oxygen saturation was 95%, nadir was 78%, time below 89% saturation of 24 minutes. Based on her sleep-related complaints and significant desaturations noted during REM sleep I suggested she return for a full night CPAP titration study. She had this on 10/18/2016, sleep efficiency was 85.4%, sleep latency of 34 minutes, REM latency was 194 minutes. She had slow-wave sleep at 7.8%, REM sleep at 12.1%. She was fitted with medium nasal pillows and CPAP was titrated from 5 cm to 9 cm. On the final pressure her AHI was 0 per hour, nonsupine REM sleep was achieved and alternated was 86%. I suggested a home CPAP pressure  of 10 cm. 12/29/16 Dr. Rexene Alberts I reviewed her CPAP compliance data from 11/28/16 to 12/27/16, which is a total of 30 days, during which time she used her CPAP 26 days, with percent used days greater than 4 hours of 63%, indicating suboptimal compliance, average usage of 5 hours and 27 minutes, average AHI of 3/hour, leak low with the 95th percentile of 2.8 lpm on a pressure of 10 cm with EPR of 3 UPDATE  09/27/2018CM  Ms.  Amy [name], 57 year old  Female returns for follow-up with a history of obstructive sleep apnea on CPAP Compliance data dated 01/10/2017 through 02/08/2017 shows greater than 4 hours at 90% for 27  Days. Average usage 6 hours 37  Minutes.  10 cm of pressure. EPR 3. AHI 2.9AHI 2.9. Leak low  with the 95th percentile.  She reports less daytime drowiness. She has a new caregiver  who requires education on cleaning and handling the CPAP equipment. She returns for reevaluation UPDATE 4/24/2019CM Amy [name], 57 year old female returns for follow-up with a history of  obstructive sleep apnea on CPAP.  She is not having any problems with her machine she denies any air leak from her mask.  She reports the last few days  she has been stuffy and not use the machine, she has allergies.  CPAP compliance dated 08/05/2017-09/03/2017 showed compliance greater than 4 hours 83% for 25 days.  Average usage 6 hours 38.  Set pressure 8 cm.  AHI 3.6 leaks 95 percentile at 2.4.  She has a caregiver 4 hours a day from 10-2 the patient is blind.  She returns for reevaluation of  REVIEW  OF SYSTEMS: Full 14 system review of systems performed and notable only for those listed, all others are neg:  Constitutional: neg  Cardiovascular: neg Ear/Nose/Throat: neg  Skin: neg Eyes:  blind Respiratory: neg Gastroitestinal: neg  Hematology/Lymphatic: neg  Endocrine: neg Musculoskeletal:neg Allergy/Immunology: neg Neurological: neg Psychiatric: neg Sleep :  Obstructive sleep pnea on CPAP   ALLERGIES: Allergies  Allergen  Reactions  . Penicillins Hives and Swelling    Has patient had a PCN reaction causing immediate rash, facial/tongue/throat swelling, SOB or lightheadedness with hypotension: yes- face swelling Has patient had a PCN reaction causing severe rash involving mucus membranes or skin necrosis: no Has patient had a PCN reaction that required hospitalization unknown (childhood allergy) Has patient had a PCN reaction occurring within the last 10 years: no If all of the above answers are "NO", then may proceed with Cephalosporin use.   . Iohexol Hives    pt developed itching and hives along with nasal congestion; needs 13 hour premeds for future studies, Onset Date: [id number]   . Codeine Nausea Only  . Iodine-131 Hives    hives  . Lisinopril Cough  . Sulfa Antibiotics Nausea And Vomiting    HOME MEDICATIONS: Outpatient Medications Prior to Visit  Medication Sig Dispense Refill  . acetaminophen (TYLENOL) 325 MG tablet Take 2 tablets (650 mg total) by mouth every 6 (six) hours as needed for mild pain (or Fever >/= 101). 30 tablet 0  . albuterol (PROVENTIL HFA;VENTOLIN HFA) 108 (90 BASE) MCG/ACT inhaler Inhale 2 puffs into the lungs every 4 (four) hours as needed for wheezing or shortness of breath. For short    . aspirin EC 81 MG tablet Take 81 mg by mouth daily.    . cetirizine (ZYRTEC) 10 MG tablet Take 10 mg by mouth daily.    . cyclobenzaprine (FLEXERIL) 10 MG tablet 10 mg as needed.    Marland Kitchen esomeprazole (NEXIUM) 40 MG capsule Take 40 mg by mouth daily at 12 noon.     . fluorometholone (FML FORTE) 0.25 % ophthalmic suspension Place 1 drop into the right eye 2 (two) times daily.    . fluticasone (FLONASE) 50 MCG/ACT nasal spray Place into both nostrils daily.    Marland Kitchen HUMALOG KWIKPEN 100 UNIT/ML KiwkPen     . insulin degludec (TRESIBA FLEXTOUCH) 100 UNIT/ML SOPN FlexTouch Pen Inject 60 Units into the skin daily at 10 pm.    . levothyroxine (SYNTHROID, LEVOTHROID) 150 MCG tablet Take 150 mcg by mouth  daily before breakfast.    . Linaclotide (LINZESS) 145 MCG CAPS capsule Take 145 mcg by mouth daily as needed (constipation).     Marland Kitchen LYRICA 150 MG capsule Take 150 mg by mouth 3 (three) times daily.    . metolazone (ZAROXOLYN) 5 MG tablet TAKE 1 TABLET BY MOUTH DAILY AS NEEDED. TAKE FOR WEIGHT GAIN OF 3 LBS OVERNIGHT OR 5 LBS IN A WEEK 30 tablet 1  . ondansetron (ZOFRAN) 4 MG tablet 4 mg. As needed    . potassium chloride SA (K-DUR,KLOR-CON) 20 MEQ tablet Take 2 tablets (40 mEq total) by mouth daily. (Patient taking differently: Take 20 mEq by mouth daily. ) 65 tablet 3  . spironolactone (ALDACTONE) 25 MG tablet Take 0.5 tablets (12.5 mg total) by mouth daily. 45 tablet 3  . torsemide (DEMADEX) 20 MG tablet Take 2 tablets (40 mg total) by mouth daily. 150 tablet 11  . triamcinolone cream (KENALOG) 0.1 % Apply 1 application topically 2 (two) times daily.    Marland Kitchen  Diclofenac Sodium 1 % CREA Place 1 Squirt onto the skin 3 (three) times daily as needed. Apply to left shoulder 120 g 0  . traMADol (ULTRAM) 50 MG tablet Take 1 tablet (50 mg total) by mouth every 6 (six) hours as needed. 15 tablet 0  . UNKNOWN TO PATIENT Diabetic ned, takes 60 units at bedtime     No facility-administered medications prior to visit.     PAST MEDICAL HISTORY: Past Medical History:  Diagnosis Date  . Asthma   . Blind   . Blindness and low vision    left eye glass eye,  legally blind in right eye  . Diabetes mellitus   . Diabetic neuropathy ([place])   . Hyperlipidemia   . Hypertension   . Hypothyroidism   . Spinal stenosis     PAST SURGICAL HISTORY: Past Surgical History:  Procedure Laterality Date  . ABDOMINAL HYSTERECTOMY  1990  . [place]   x 2  . CHOLECYSTECTOMY  2008  . ENUCLEATION Bilateral 09/15/1998  . EYE SURGERY  2016   fitting artificial eye    FAMILY HISTORY: Family History  Problem Relation Age of Onset  . Diabetes Sister   . Glaucoma Sister   . Hypertension Sister   .  Stroke Brother   . Heart attack Paternal Aunt     SOCIAL HISTORY: Social History   Socioeconomic History  . Marital status: Single    Spouse name: Not on file  . Number of children: 2  . Years of education: 106  . Highest education level: Not on file  Occupational History  . Occupation: N/A    Comment: disabled  Social Needs  . Financial resource strain: Not on file  . Food insecurity:    Worry: Not on file    Inability: Not on file  . Transportation needs:    Medical: Not on file    Non-medical: Not on file  Tobacco Use  . Smoking status: Former Smoker    Types: Cigarettes    Last attempt to quit: 05/22/1992    Years since quitting: 25.3  . Smokeless tobacco: Never Used  Substance and Sexual Activity  . Alcohol use: No    Comment: quit 20 yrs ago  . Drug use: No  . Sexual activity: Not Currently  Lifestyle  . Physical activity:    Days per week: Not on file    Minutes per session: Not on file  . Stress: Not on file  Relationships  . Social connections:    Talks on phone: Not on file    Gets together: Not on file    Attends religious service: Not on file    Active member of club or organization: Not on file    Attends meetings of clubs or organizations: Not on file    Relationship status: Not on file  . Intimate partner violence:    Fear of current or ex partner: Not on file    Emotionally abused: Not on file    Physically abused: Not on file    Forced sexual activity: Not on file  Other Topics Concern  . Not on file  Social History Narrative   Lives alone   caffeine drinks 2 cups of coffee a day, occasional soda      PHYSICAL EXAM  Vitals:   09/06/17 0827  BP: 128/76  Pulse: 70  Weight: 210 lb 9.6 oz (95.5 kg)  Height: '4\' 11"'  (1.499 m)   Body mass index  is 42.54 kg/m.  Generalized: Well developed, in no acute distress ,  Well-groomed Head: normocephalic and atraumatic,. Oropharynx benign  Neck: Supple,  Musculoskeletal: No deformity    Neurological examination   Mentation: Alert oriented to time, place, history taking. Attention span and concentration appropriate. Recent and remote memory intact.  Follows all commands speech and language fluent.   Cranial nerve II-XII: R Pupils was round not reactive to light ,  Left eye prosthesis. extraocular movements not possible to track. Facial sensation and strength were normal. hearing was intact to finger rubbing bilaterally. Uvula tongue midline. head turning and shoulder shrug were normal and symmetric.Tongue protrusion into cheek strength was normal. Motor: normal bulk and tone, full strength in the BUE, BLE, Sensory: normal and symmetric to light touch, face arms and legs Coordination: finger-nose-finger, heel-to-shin bilaterally, no dysmetria Reflexes:  1+ in the upper extremities,absent in the lower extremities plantar responses were flexor bilaterally. Gait and Station: Rising up from seated position without assistance, she ambulates with her blind stick and walks with assist of one. DIAGNOSTIC DATA (LABS, IMAGING, TESTING) - I reviewed patient records, labs, notes, testing and imaging myself where available.  Lab Results  Component Value Date   WBC 12.0 (H) 02/04/2017   HGB 11.4 (L) 02/04/2017   HCT 34.9 (L) 02/04/2017   MCV 79.0 02/04/2017   PLT 166 02/04/2017      Component Value Date/Time   NA 141 08/15/2017 1135   NA 149 (H) 04/04/2017 1132   K 3.3 (L) 08/15/2017 1135   CL 102 08/15/2017 1135   CO2 28 08/15/2017 1135   GLUCOSE 123 (H) 08/15/2017 1135   BUN 37 (H) 08/15/2017 1135   BUN 27 (H) 04/04/2017 1132   CREATININE 1.60 (H) 08/15/2017 1135   CREATININE 0.92 10/22/2012 1351   CALCIUM 9.0 08/15/2017 1135   PROT 7.0 02/04/2017 0243   ALBUMIN 3.0 (L) 02/04/2017 0243   AST 15 02/04/2017 0243   ALT 18 02/04/2017 0243   ALKPHOS 114 02/04/2017 0243   BILITOT 0.5 02/04/2017 0243   GFRNONAA 34 (L) 08/15/2017 1135   GFRAA 40 (L) 08/15/2017 1135   Lab  Results  Component Value Date   CHOL 170 10/22/2012   HDL 56 10/22/2012   LDLCALC 92 10/22/2012   TRIG 112 10/22/2012   CHOLHDL 3.0 10/22/2012   Lab Results  Component Value Date   HGBA1C 9.9 (H) 01/31/2016   Lab Results  Component Value Date   VITAMINB12 325 05/25/2011   Lab Results  Component Value Date   TSH 2.285 01/31/2016      ASSESSMENT AND PLAN 57 year old female with an underlying medical history of diabetes, blindness (with L prosthetic eye and R eye blindness d/t diabetes), hyperlipidemia, asthma, arthritis, spinal stenosis, thyroid disease, nephropathy, orthostatic hypotension deemed secondary to diabetic autonomic neuropathy (for which she saw Dr. Leta Baptist in 9/17), and morbid obesity, who presents for follow-up consultation of her obstructive sleep apnea, her baseline sleep study in May 2018 indicated overall mild OSA but more significant REM related sleep apnea with significant desaturations during REM sleep. CPAP compliance dated 08/05/2017-09/03/2017 showed compliance greater than 4 hours 83% for 25 days.  Average usage 6 hours 38.  Set pressure 8 cm.  AHI 3.6 leaks 95 percentile at 2.4.    She has noted improvement in her sleep quality   PLAN: CPAP  Compliance at 83% greater  than 4 hours. Continue same settings Will order supplies F/U  yearly. Dennie Bible, GNP, [facility],  APRN  [facility] [address] [place], [place] [address] [phone]  I reviewed the above note and documentation by the Nurse Practitioner and agree with the history, physical exam, assessment and plan as outlined above. I was immediately available for face-to-face consultation. [name], MD, PhD [facility])

## 2017-09-06 ENCOUNTER — Ambulatory Visit (INDEPENDENT_AMBULATORY_CARE_PROVIDER_SITE_OTHER): Payer: Medicare Other | Admitting: Nurse Practitioner

## 2017-09-06 ENCOUNTER — Encounter: Payer: Self-pay | Admitting: Nurse Practitioner

## 2017-09-06 VITALS — BP 128/76 | HR 70 | Ht 59.0 in | Wt 210.6 lb

## 2017-09-06 DIAGNOSIS — G4733 Obstructive sleep apnea (adult) (pediatric): Secondary | ICD-10-CM | POA: Diagnosis not present

## 2017-09-06 DIAGNOSIS — Z9989 Dependence on other enabling machines and devices: Secondary | ICD-10-CM

## 2017-09-06 NOTE — Patient Instructions (Signed)
CPAP  Compliance at 83% greater  than 4 hours. Continue same settings Will order supplies F/U  yearly.

## 2017-09-06 NOTE — Progress Notes (Signed)
Fax confirmation received for DME [facility] cpap supplies [phone]. sy

## 2017-09-08 ENCOUNTER — Ambulatory Visit (INDEPENDENT_AMBULATORY_CARE_PROVIDER_SITE_OTHER): Payer: Medicare Other | Admitting: Podiatry

## 2017-09-08 ENCOUNTER — Encounter: Payer: Self-pay | Admitting: Podiatry

## 2017-09-08 DIAGNOSIS — E0842 Diabetes mellitus due to underlying condition with diabetic polyneuropathy: Secondary | ICD-10-CM

## 2017-09-08 DIAGNOSIS — M79676 Pain in unspecified toe(s): Secondary | ICD-10-CM

## 2017-09-08 DIAGNOSIS — B351 Tinea unguium: Secondary | ICD-10-CM | POA: Diagnosis not present

## 2017-09-08 NOTE — Progress Notes (Signed)
Patient ID: Amy [name], female   DOB: [birth year], 59 y.o.   MRN: [id number] Complaint:  Visit Type: Patient returns to my office for continued preventative foot care services. Complaint: Patient states" my nails have grown long and thick and become painful to walk and wear shoes" Patient has been diagnosed with DM with neuropathy.. The patient presents for preventative foot care services. No changes to ROS.  Patient says she is having pain right foot big toe from diabetic shoes.  Patient is diabetic and blind.  Podiatric Exam: Vascular: dorsalis pedis and posterior tibial pulses are palpable bilateral. Capillary return is immediate. Temperature gradient is WNL. Skin turgor WNL  Sensorium: Normal Semmes Weinstein monofilament test. Normal tactile sensation bilaterally. Nail Exam: Pt has thick disfigured discolored nails with subungual debris noted bilateral entire nail hallux through fifth toenails Ulcer Exam: There is no evidence of ulcer or pre-ulcerative changes or infection. Orthopedic Exam: Muscle tone and strength are WNL. No limitations in general ROM. No crepitus or effusions noted. Foot type and digits show no abnormalities. Bony prominences are unremarkable. Skin: No Porokeratosis. No infection or ulcers.  Asymptomatic plantar fibroma left arch. Keratoderma climacticum right heel. Diagnosis:  Onychomycosis, , Pain in right toe, pain in left toes  Treatment & Plan Procedures and Treatment: Consent by patient was obtained for treatment procedures. The patient understood the discussion of treatment and procedures well. All questions were answered thoroughly reviewed. Debridement of mycotic and hypertrophic toenails, 1 through 5 bilateral and clearing of subungual debris. No ulceration, no infection noted.  Padding applied to diabetic insole at heel for cushion.  Return Visit-Office Procedure: Patient instructed to return to the office for a follow up visit 10 weeks  for continued evaluation  and treatment.    Gardiner Barefoot DPM

## 2017-09-15 ENCOUNTER — Encounter: Payer: Self-pay | Admitting: Podiatry

## 2017-09-15 ENCOUNTER — Ambulatory Visit (INDEPENDENT_AMBULATORY_CARE_PROVIDER_SITE_OTHER): Payer: Medicare Other | Admitting: Podiatry

## 2017-09-15 DIAGNOSIS — B351 Tinea unguium: Secondary | ICD-10-CM

## 2017-09-15 DIAGNOSIS — E0842 Diabetes mellitus due to underlying condition with diabetic polyneuropathy: Secondary | ICD-10-CM

## 2017-09-15 DIAGNOSIS — L608 Other nail disorders: Secondary | ICD-10-CM

## 2017-09-15 DIAGNOSIS — M79676 Pain in unspecified toe(s): Secondary | ICD-10-CM

## 2017-09-15 MED ORDER — UREA 40 % EX CREA: TOPICAL_CREAM | CUTANEOUS | 1 refills | Status: DC

## 2017-09-15 NOTE — Progress Notes (Signed)
This patient returns to the office stating she's having continued pain noted along the borders of both big toenails.  She was seen approximately one week ago for treatment of these nails.  She says she still is experiencing pain and discomfort when she walks along the nail borders.  Patient has been diagnosed with pincer toenails bilaterally with marked incurvation along the inside and outside borders of both big toes.  She also says that the callus on her right heel remains painful walking.  She returns to the office for continued evaluation and treatment.  General Appearance  Alert, conversant and in no acute stress.  Vascular  Dorsalis pedis and posterior tibial  pulses are palpable  bilaterally.  Capillary return is within normal limits  bilaterally. Temperature is within normal limits  Bilaterally. Hair present on digits.  Neurologic  Senn-Weinstein monofilament wire test within normal limits  bilaterally. Muscle power within normal limits bilaterally.  Nails Thick disfigured discolored nails with subungual debris  from hallux to fifth toes bilaterally. No evidence of bacterial infection or drainage bilaterally. Pincer toenails  B/L  Orthopedic  No limitations of motion of motion feet .  No crepitus or effusions noted.  No bony pathology or digital deformities noted. Asymptomatic plantar fibroma left foot.  Skin  normotropic skin with no porokeratosis noted bilaterally.  No signs of infections or ulcers noted.    Onychomycosis  B/L  Pincer nails  B/L.  Heel callus right.    ROV.  Examined both great toes and determined she has marked incurvation noted along the nails.  No evidence of any redness, swelling or drainage.  I debrided  the thickness of the nail, but the pain continued.  I therefore, explained the nail procedure for the permanent removal of the medial and lateral borders of both great toes.  This patient is a diabetic who says that her hemoglobin A1c is approximately 9.0.  Her  neurovascular status in her feet are within normal limits.  I told her once she got permission from her medical doctor for the surgical procedure. I would perform this procedure as an out patient in the office.  Carmol cream was ordered for this patient.  RTC prn  Gardiner Barefoot DPM

## 2017-09-21 ENCOUNTER — Encounter (HOSPITAL_COMMUNITY): Payer: Medicare Other

## 2017-09-26 ENCOUNTER — Encounter (HOSPITAL_COMMUNITY): Payer: Medicare Other

## 2017-09-28 IMAGING — DX DG CHEST 2V
2 series · 2 of 2 positions shown · non-contrast
Comparison: 07/05/2015

CLINICAL DATA: Bilateral leg swelling for 2 weeks. Shortness of
breath.

EXAM:
CHEST  2 VIEW

[chest pa]
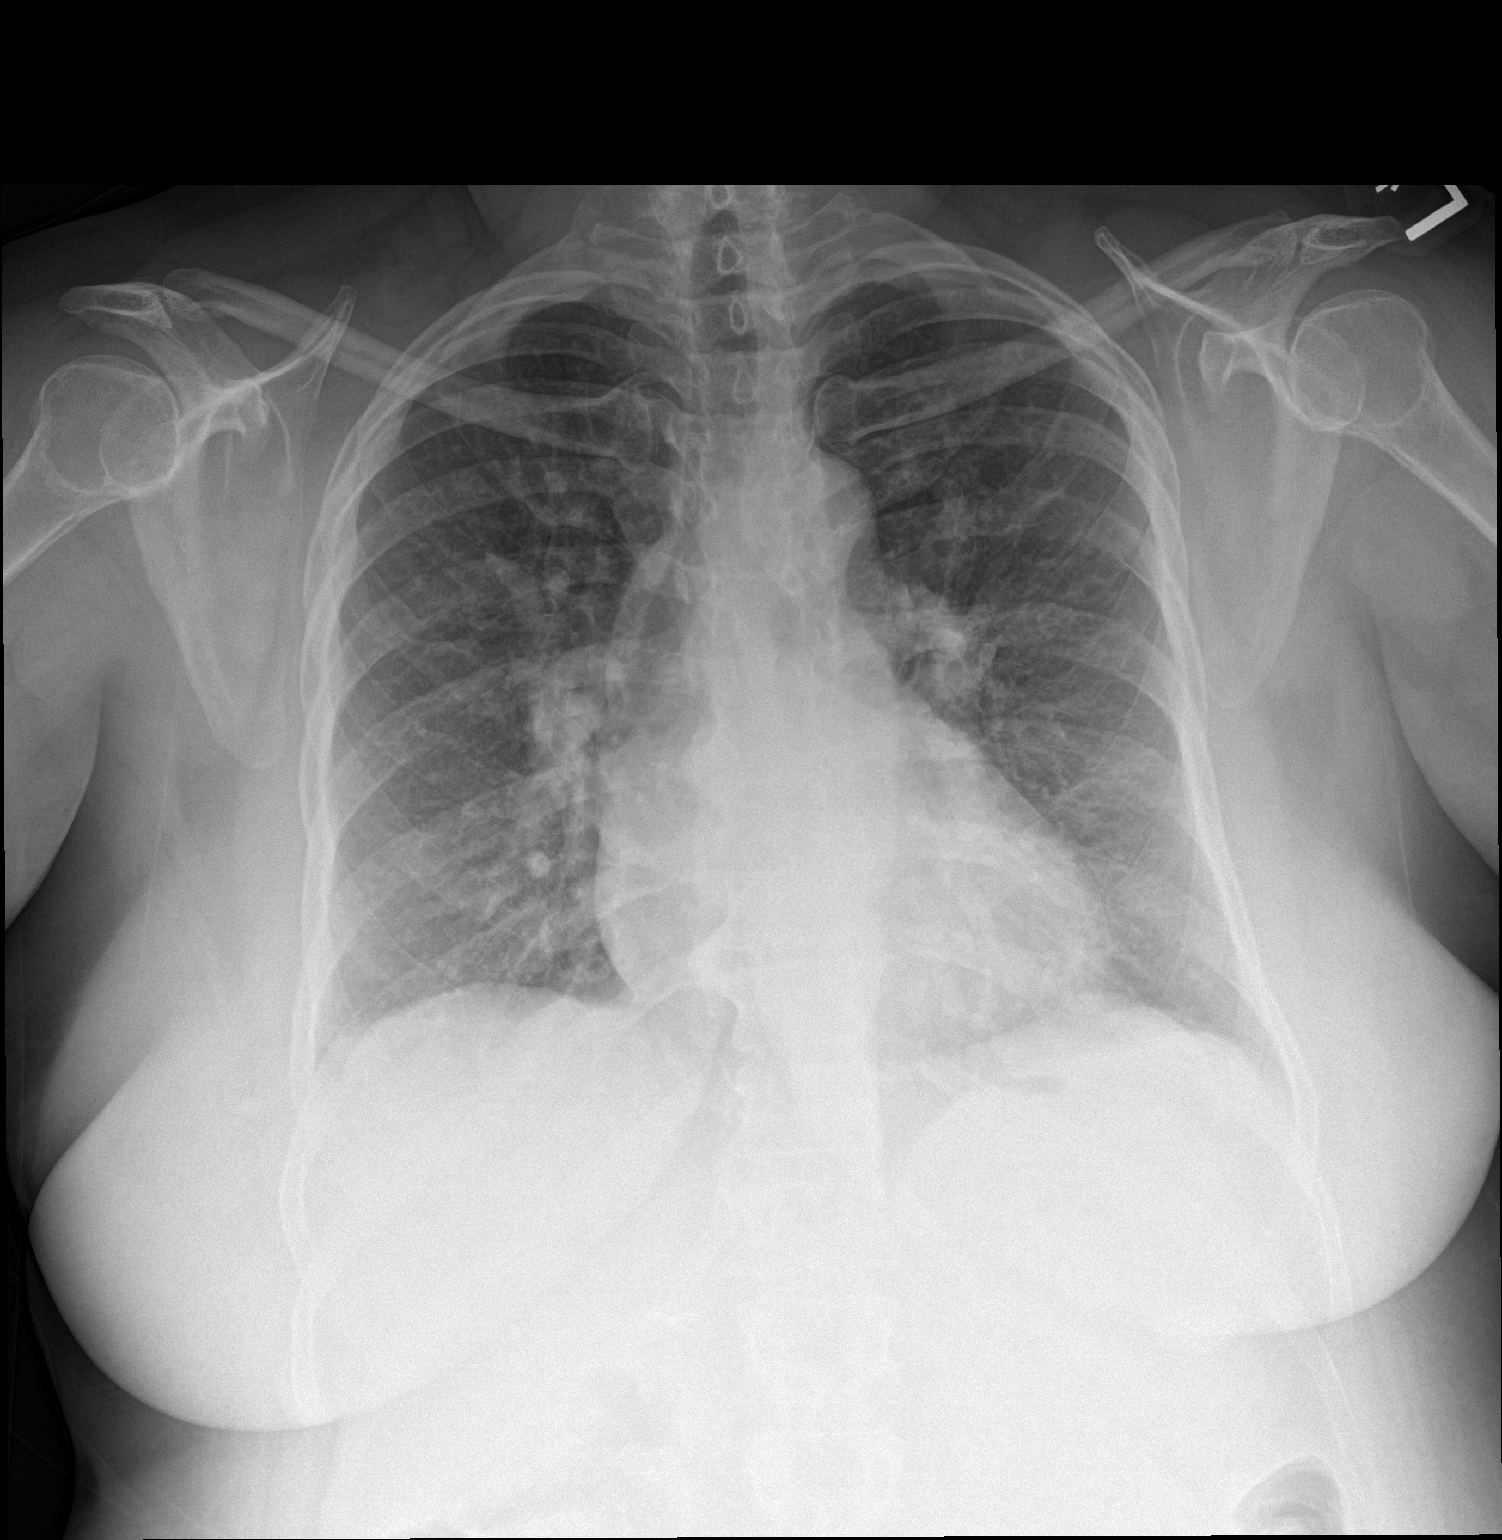

[chest lat]
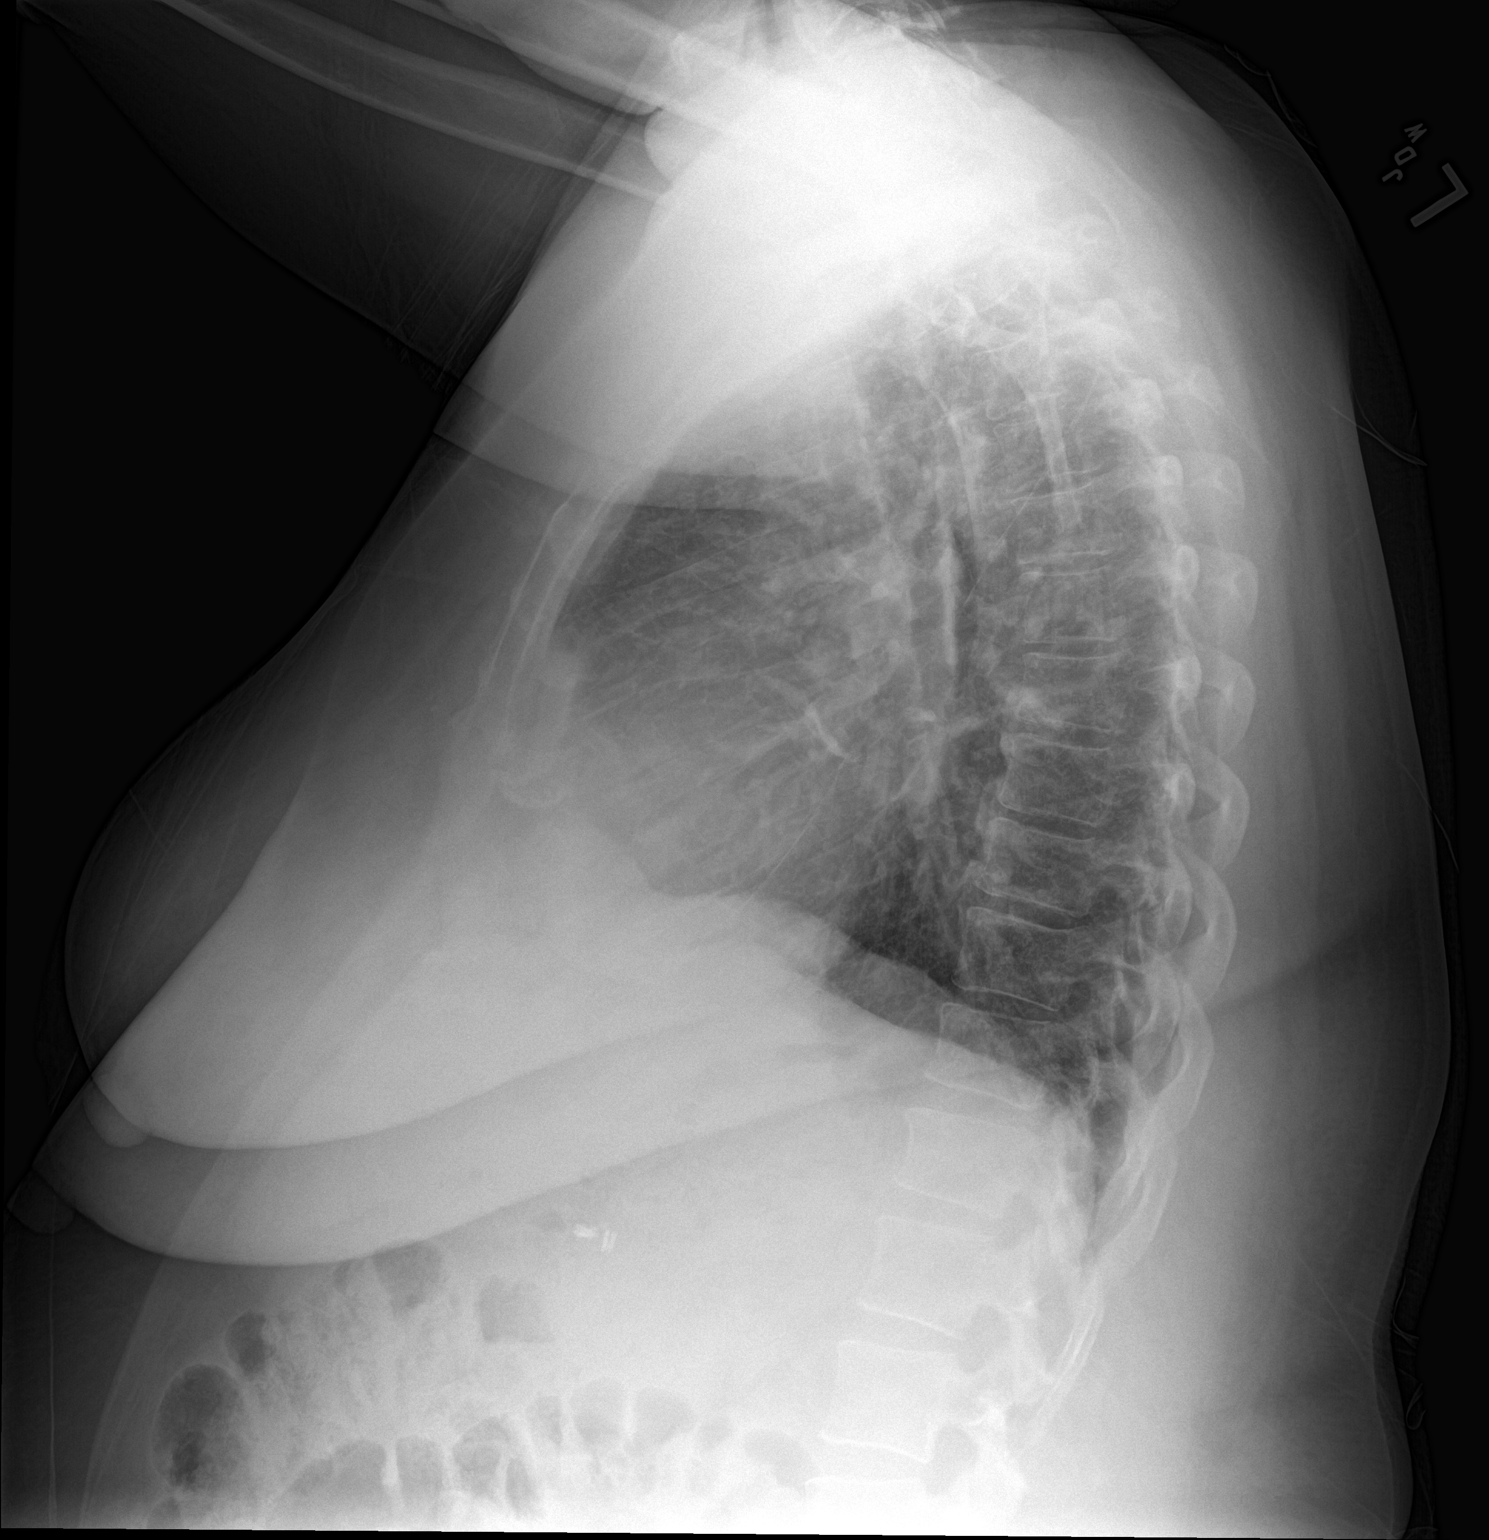

[2 of 2 positions shown; findings below may reference images not displayed]

FINDINGS: Peribronchial thickening and interstitial prominence within the
lungs. Heart is normal size. No effusions. No acute bony
abnormality.
IMPRESSION: Peribronchial thickening and interstitial prominence, new since
prior study. This could represent bronchitis or interstitial
pneumonia.

## 2017-10-07 ENCOUNTER — Encounter (HOSPITAL_COMMUNITY): Payer: Self-pay | Admitting: Emergency Medicine

## 2017-10-07 ENCOUNTER — Ambulatory Visit (HOSPITAL_COMMUNITY)
Admission: EM | Admit: 2017-10-07 | Discharge: 2017-10-07 | Disposition: A | Discharge status: Home / Self Care | Payer: Medicare Other | Attending: Family Medicine | Admitting: Family Medicine

## 2017-10-07 DIAGNOSIS — M7661 Achilles tendinitis, right leg: Secondary | ICD-10-CM | POA: Diagnosis not present

## 2017-10-07 HISTORY — DX: Unspecified osteoarthritis, unspecified site: M19.90

## 2017-10-07 MED ORDER — DICLOFENAC SODIUM 1 % TD GEL: 2.0000 g | Freq: Four times a day (QID) | TRANSDERMAL | 0 refills | Status: DC | PRN

## 2017-10-07 NOTE — Discharge Instructions (Addendum)
Rest, ice and elevate Use OTC tylenol as needed for pain Prescribed Voltaren gel Follow up with PCP if symptoms persists Return or go to the ER if you have any new or worsening symptoms

## 2017-10-07 NOTE — ED Provider Notes (Signed)
[place]   [id number] 10/07/17 Arrival Time: 9381  SUBJECTIVE: History from: patient. Amy [name] is a 57 y.o. female hx significant for DM with peripheral neuropathy, HTN, HLD, and spinal stenosis complains of right foot pain for the past two months, pain recently exacerbated after jumping out of bed 5 days ago.  Localizes the pain to the bottom of right foot.  Describes the pain as worsening, constant and throbbing in character.  Has tried OTC medications like tylenol and soaking foot without relief.  Symptoms are made worse with weight bearing.  Denies similar symptoms in the past.  Denies fever, chills, nausea, vomiting, chest pain, SOB, erythema, ecchymosis, effusion, weakness, numbness and tingling.      ROS: As per HPI.  Past Medical History:  Diagnosis Date  . Arthritis   . Asthma   . Blind   . Blindness and low vision    left eye glass eye,  legally blind in right eye  . Diabetes mellitus   . Diabetic neuropathy ([employer])   . Hyperlipidemia   . Hypertension   . Hypothyroidism   . Spinal stenosis    Past Surgical History:  Procedure Laterality Date  . ABDOMINAL HYSTERECTOMY  1990  . [place]   x 2  . CHOLECYSTECTOMY  2008  . ENUCLEATION Bilateral 09/15/1998  . EYE SURGERY  2016   fitting artificial eye   Allergies  Allergen Reactions  . Penicillins Hives and Swelling    Has patient had a PCN reaction causing immediate rash, facial/tongue/throat swelling, SOB or lightheadedness with hypotension: yes- face swelling Has patient had a PCN reaction causing severe rash involving mucus membranes or skin necrosis: no Has patient had a PCN reaction that required hospitalization unknown (childhood allergy) Has patient had a PCN reaction occurring within the last 10 years: no If all of the above answers are "NO", then may proceed with Cephalosporin use.   . Iohexol Hives    pt developed itching and hives along with nasal congestion; needs 13  hour premeds for future studies, Onset Date: [id number]   . Codeine Nausea Only  . Iodine-131 Hives    hives  . Lisinopril Cough  . Sulfa Antibiotics Nausea And Vomiting   No current facility-administered medications on file prior to encounter.    Current Outpatient Medications on File Prior to Encounter  Medication Sig Dispense Refill  . acetaminophen (TYLENOL) 325 MG tablet Take 2 tablets (650 mg total) by mouth every 6 (six) hours as needed for mild pain (or Fever >/= 101). 30 tablet 0  . albuterol (PROVENTIL HFA;VENTOLIN HFA) 108 (90 BASE) MCG/ACT inhaler Inhale 2 puffs into the lungs every 4 (four) hours as needed for wheezing or shortness of breath. For short    . aspirin EC 81 MG tablet Take 81 mg by mouth daily.    . cetirizine (ZYRTEC) 10 MG tablet Take 10 mg by mouth daily.    Marland Kitchen esomeprazole (NEXIUM) 40 MG capsule Take 40 mg by mouth daily at 12 noon.     . fluorometholone (FML FORTE) 0.25 % ophthalmic suspension Place 1 drop into the right eye 2 (two) times daily.    . fluticasone (FLONASE) 50 MCG/ACT nasal spray Place into both nostrils daily.    Marland Kitchen HUMALOG KWIKPEN 100 UNIT/ML KiwkPen     . insulin degludec (TRESIBA FLEXTOUCH) 100 UNIT/ML SOPN FlexTouch Pen Inject 60 Units into the skin daily at 10 pm.    . levothyroxine (SYNTHROID, LEVOTHROID) 150  MCG tablet Take 150 mcg by mouth daily before breakfast.    . Linaclotide (LINZESS) 145 MCG CAPS capsule Take 145 mcg by mouth daily as needed (constipation).     Marland Kitchen LYRICA 150 MG capsule Take 150 mg by mouth 3 (three) times daily.    . metolazone (ZAROXOLYN) 5 MG tablet TAKE 1 TABLET BY MOUTH DAILY AS NEEDED. TAKE FOR WEIGHT GAIN OF 3 LBS OVERNIGHT OR 5 LBS IN A WEEK 30 tablet 1  . potassium chloride SA (K-DUR,KLOR-CON) 20 MEQ tablet Take 2 tablets (40 mEq total) by mouth daily. (Patient taking differently: Take 20 mEq by mouth daily. ) 65 tablet 3  . spironolactone (ALDACTONE) 25 MG tablet Take 0.5 tablets (12.5 mg total) by mouth  daily. 45 tablet 3  . torsemide (DEMADEX) 20 MG tablet Take 2 tablets (40 mg total) by mouth daily. 150 tablet 11  . triamcinolone cream (KENALOG) 0.1 % Apply 1 application topically 2 (two) times daily.    . cyclobenzaprine (FLEXERIL) 10 MG tablet 10 mg as needed.    . ondansetron (ZOFRAN) 4 MG tablet 4 mg. As needed    . urea (CARMOL) 40 % CREA Apply finger tip to effected area 1 each 1  . [DISCONTINUED] insulin glargine (LANTUS) 100 UNIT/ML injection Inject 0.4 mLs (40 Units total) into the skin at bedtime. 10 mL 3   Social History   Socioeconomic History  . Marital status: Single    Spouse name: Not on file  . Number of children: 2  . Years of education: 40  . Highest education level: Not on file  Occupational History  . Occupation: N/A    Comment: disabled  Social Needs  . Financial resource strain: Not on file  . Food insecurity:    Worry: Not on file    Inability: Not on file  . Transportation needs:    Medical: Not on file    Non-medical: Not on file  Tobacco Use  . Smoking status: Former Smoker    Types: Cigarettes    Last attempt to quit: 05/22/1992    Years since quitting: 25.3  . Smokeless tobacco: Never Used  Substance and Sexual Activity  . Alcohol use: No    Comment: quit 20 yrs ago  . Drug use: No  . Sexual activity: Not on file  Lifestyle  . Physical activity:    Days per week: Not on file    Minutes per session: Not on file  . Stress: Not on file  Relationships  . Social connections:    Talks on phone: Not on file    Gets together: Not on file    Attends religious service: Not on file    Active member of club or organization: Not on file    Attends meetings of clubs or organizations: Not on file    Relationship status: Not on file  . Intimate partner violence:    Fear of current or ex partner: Not on file    Emotionally abused: Not on file    Physically abused: Not on file    Forced sexual activity: Not on file  Other Topics Concern  . Not on  file  Social History Narrative   Lives alone   caffeine drinks 2 cups of coffee a day, occasional soda    Family History  Problem Relation Age of Onset  . Diabetes Sister   . Glaucoma Sister   . Hypertension Sister   . Stroke Brother   . Heart attack Paternal  Aunt     OBJECTIVE:  Vitals:   10/07/17 1454  BP: 128/61  Pulse: 75  Resp: 18  Temp: 98.3 F (36.8 C)  TempSrc: Oral  SpO2: 98%    General appearance: AOx3; in no acute distress.  Head: NCAT Lungs: CTA bilaterally Heart: RRR.  Clear S1 and S2 without murmur, gallops, or rubs.  Radial pulses 2+ bilaterally. Musculoskeletal:Right foot  Inspection: Skin warm, dry, and intact without obvious erythema, effusion, or ecchymosis. Hard callus plantar aspect of calcaneus Palpation: Diffusely tender about the calcaneus and achilles ROM: FROM active and passive Strength: 5/5 knee abduction, 5/5 knee adduction, 5/5 knee flexion, 5/5 knee extension, 5/5 dorsiflexion, 5/5 plantar flexion CV: Dorsalis pedis pulse 2+ and intact; cap refill <2 secs right toe Skin: warm and dry Neurologic: Ambulates without difficulty; Sensation intact about the upper/ lower extremities Psychological: alert and cooperative; normal mood and affect  ASSESSMENT & PLAN:  1. Right Achilles bursitis      Meds ordered this encounter  Medications  . diclofenac sodium (VOLTAREN) 1 % GEL    Sig: Apply 2 g topically 4 (four) times daily as needed (Pain).    Dispense:  100 g    Refill:  0    Order Specific Question:   Supervising Provider    Answer:   Wynona Luna [[id number]]    Rest, ice and elevate Use OTC tylenol as needed for pain Prescribed Voltaren gel Follow up with PCP if symptoms persists Return or go to the ER if you have any new or worsening symptoms  Reviewed expectations re: course of current medical issues. Questions answered. Outlined signs and symptoms indicating need for more acute intervention. Patient verbalized  understanding. After Visit Summary given.    Lestine Box, PA-C 10/07/17 1544

## 2017-10-07 NOTE — ED Triage Notes (Signed)
Reports getting startled while sleeping 5 days ago during night, causing her to get quickly out of bed.  Denies fall, but states since then c/o pain in RLE from ankle up to groin.  Also c/o callous on right foot that is now painful.

## 2017-10-14 IMAGING — CT CT ABD-PELV W/O CM
2 of 4 series · 17 of 46 positions shown, 19 images · non-contrast
Comparison: 06/14/2015

CLINICAL DATA: Lower abdominal pain for 1 day.  Contrast allergy

EXAM:
CT ABDOMEN AND PELVIS WITHOUT CONTRAST
TECHNIQUE: Multidetector CT imaging of the abdomen and pelvis was performed
following the standard protocol without IV contrast.

[Series 2: abd/pel w/o · axial · non-contrast · 0.68mm/px · z∈[+994,+1354]mm · 14 of 82 slices shown, 16 images]
[im 5/82  soft-tissue]
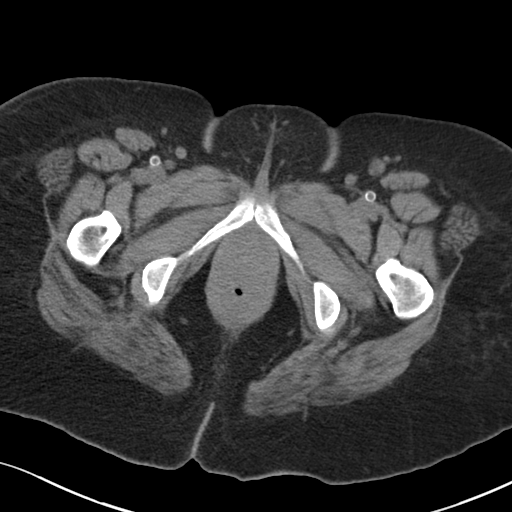
[im 5/82  bone]
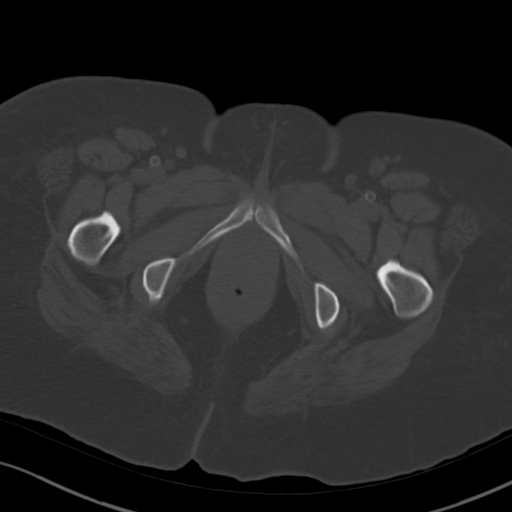
[im 13/82  soft-tissue]
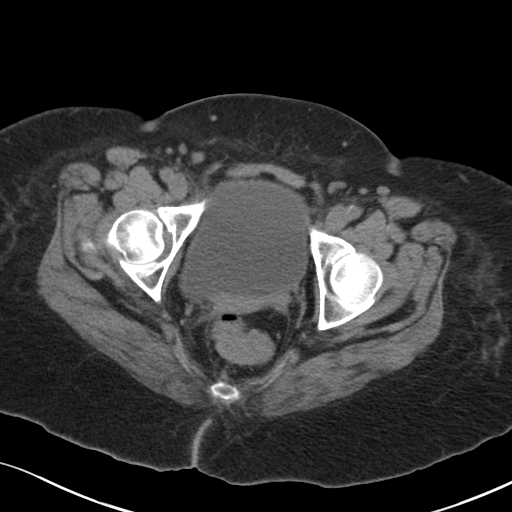
[im 17/82  soft-tissue]
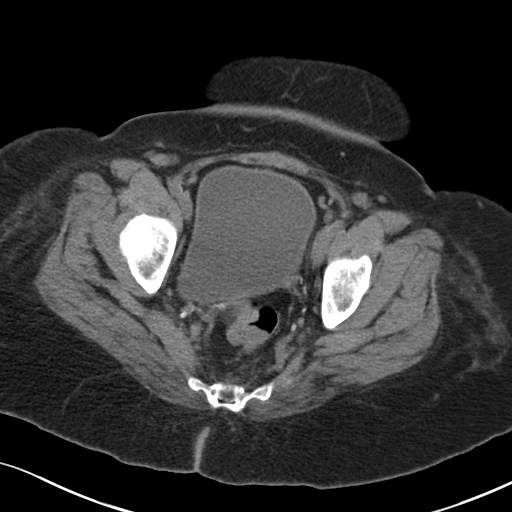
[im 21/82  soft-tissue]
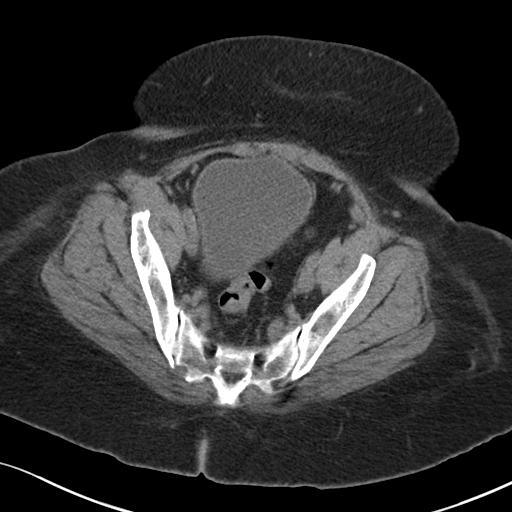
[im 29/82  soft-tissue]
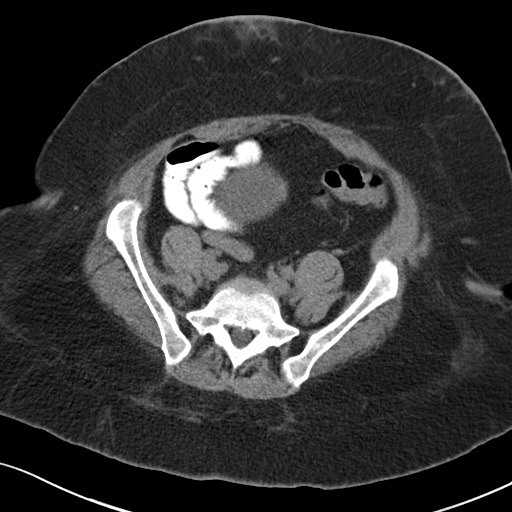
[im 33/82  soft-tissue]
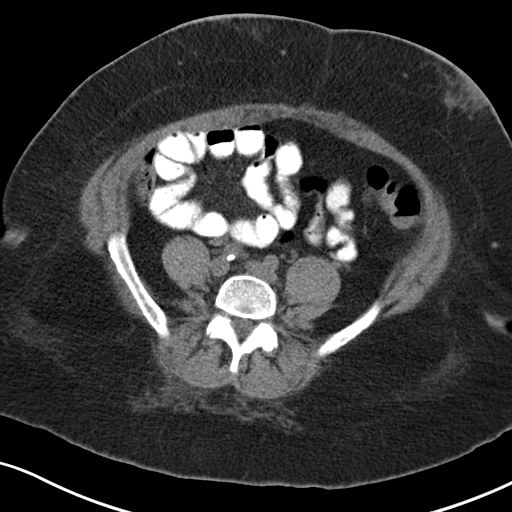
[im 37/82  soft-tissue]
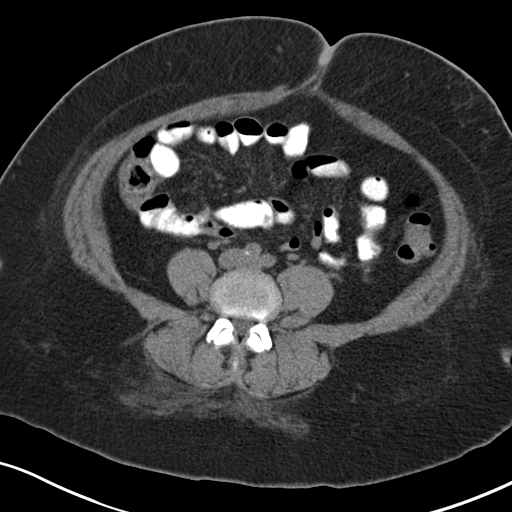
[im 45/82  soft-tissue]
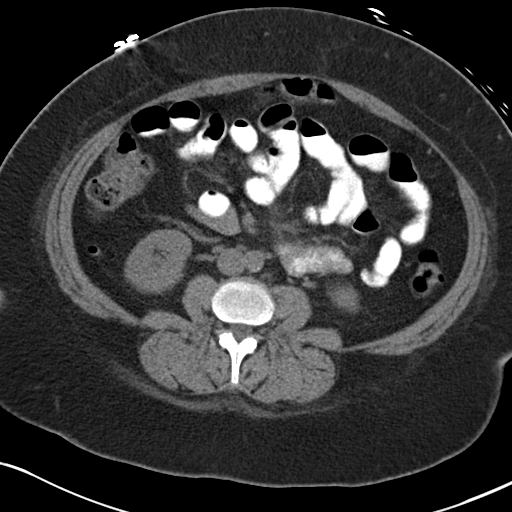
[im 49/82  soft-tissue]
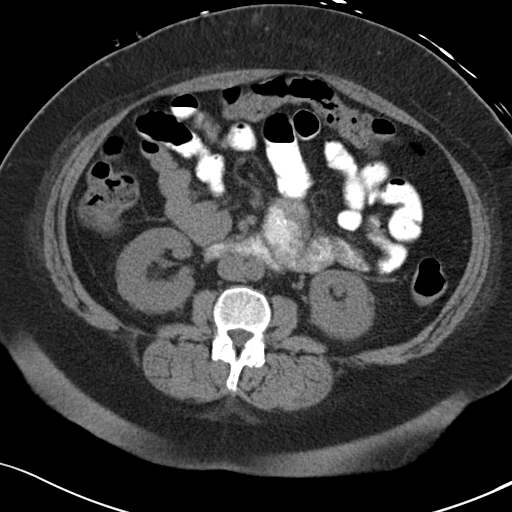
[im 49/82  bone]
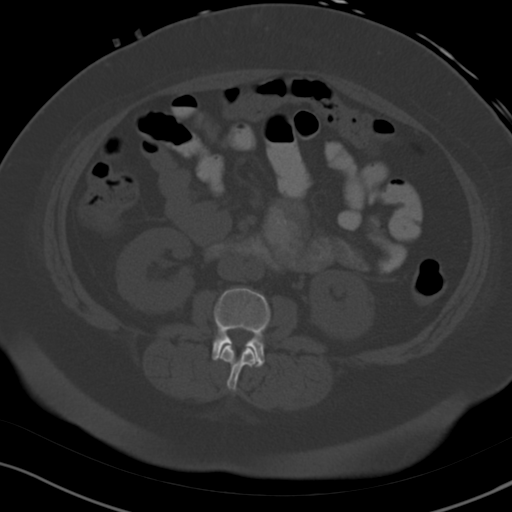
[im 53/82  soft-tissue]
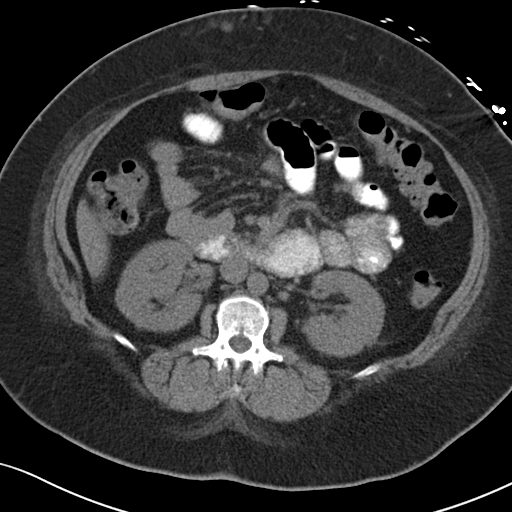
[im 61/82  soft-tissue]
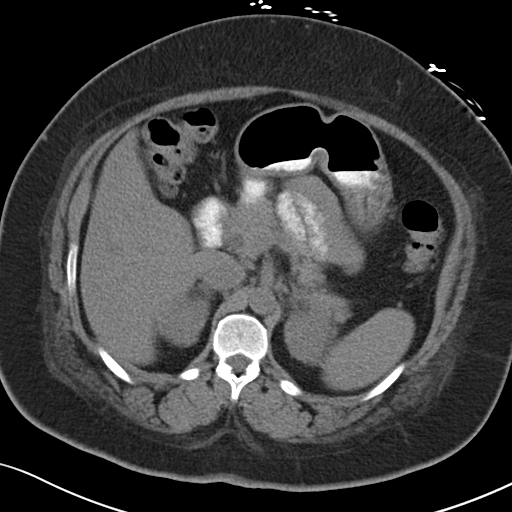
[im 65/82  soft-tissue]
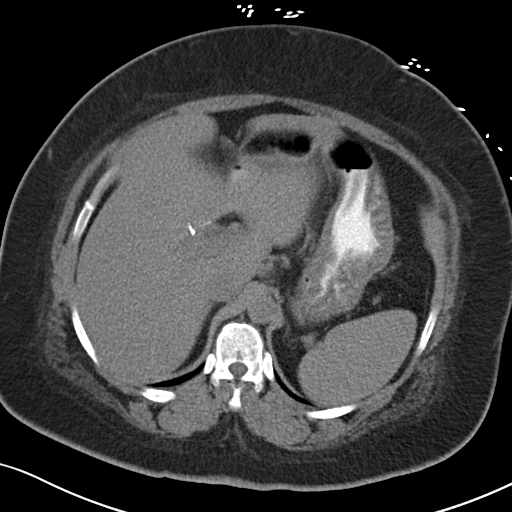
[im 69/82  soft-tissue]
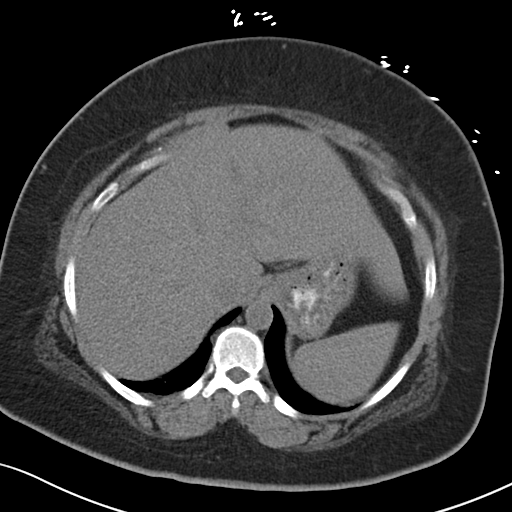
[im 77/82  soft-tissue]
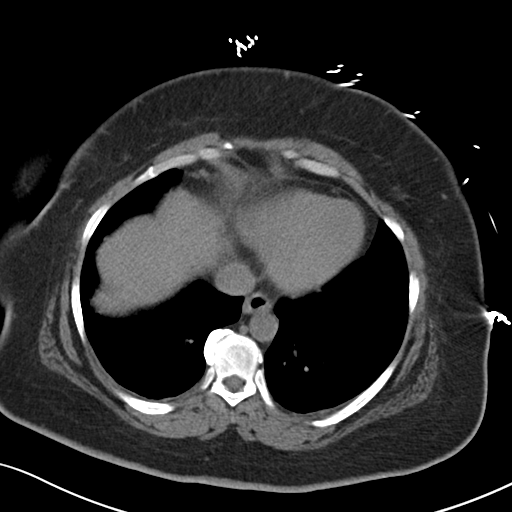

[Series 4: coronal · coronal · 0.71mm/px · 3 of 148 slices shown]
[im 50/148  soft-tissue]
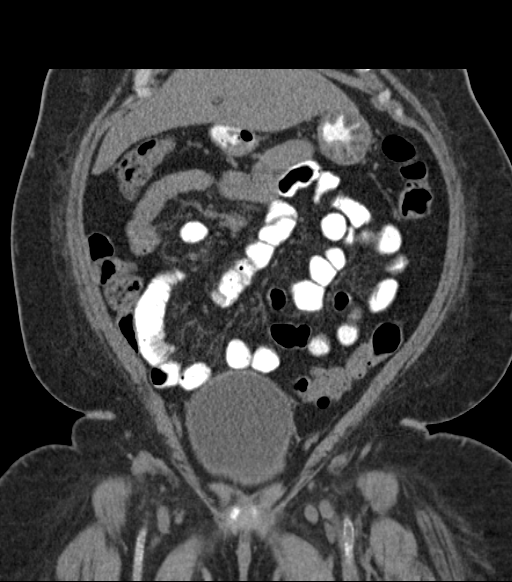
[im 66/148  soft-tissue]
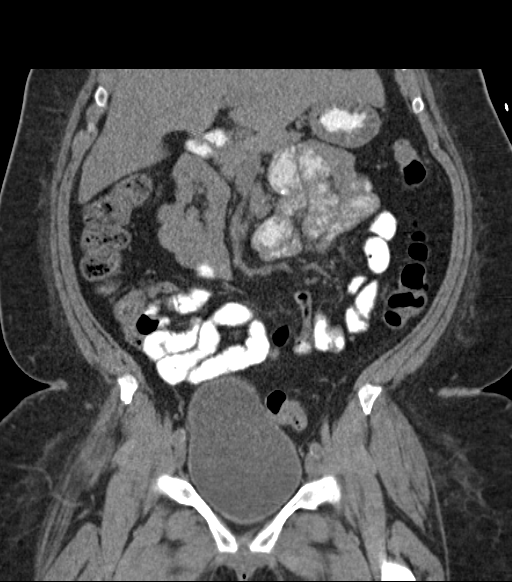
[im 82/148  soft-tissue]
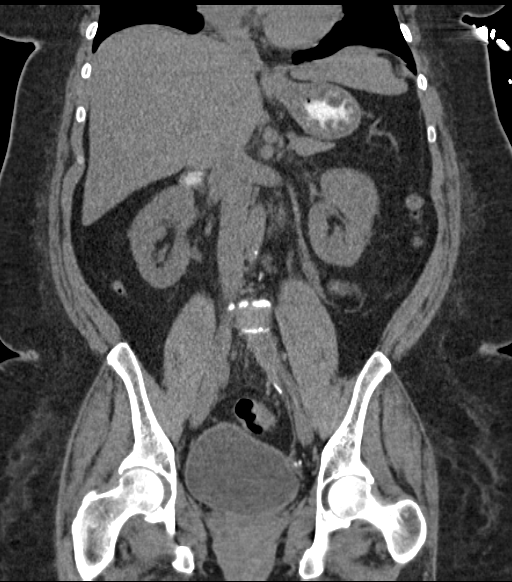

[17 of 46 positions shown; findings below may reference images not displayed]

FINDINGS: Lower chest and abdominal wall: Medication injection sites in the
subcutaneous lower abdominal wall

Hepatobiliary: Roughly 15 mm low-density lesion in the right liver
is subtle. No evidence of growth since 5964 comparison, compatible
with a benign process.Cholecystectomy. Negative common bile duct.

Pancreas: Unremarkable.

Spleen: Unremarkable.

Adrenals/Urinary Tract: Negative adrenals. No hydronephrosis or
stone. Physiologic distension of the bladder. Chronic wall
thickening which is nonspecific - no pericystic inflammation.

Reproductive:Hysterectomy with negative adnexae.

Stomach/Bowel:  No obstruction. No appendicitis.

Vascular/Lymphatic: Prominent atherosclerosis for age. No mass or
adenopathy.

Peritoneal: No ascites or pneumoperitoneum.

Musculoskeletal: No acute abnormalities.
IMPRESSION: No acute finding or change from prior. No explanation for abdominal
pain.

## 2017-10-14 IMAGING — DX DG CHEST 1V PORT
1 series · 1 of 1 positions shown · non-contrast
Comparison: Chest radiograph August 26, 2015

CLINICAL DATA: Presyncope, diarrhea, nausea and body aches. History
of asthma, diabetes, hypertension.

EXAM:
PORTABLE CHEST 1 VIEW

[chest ap]
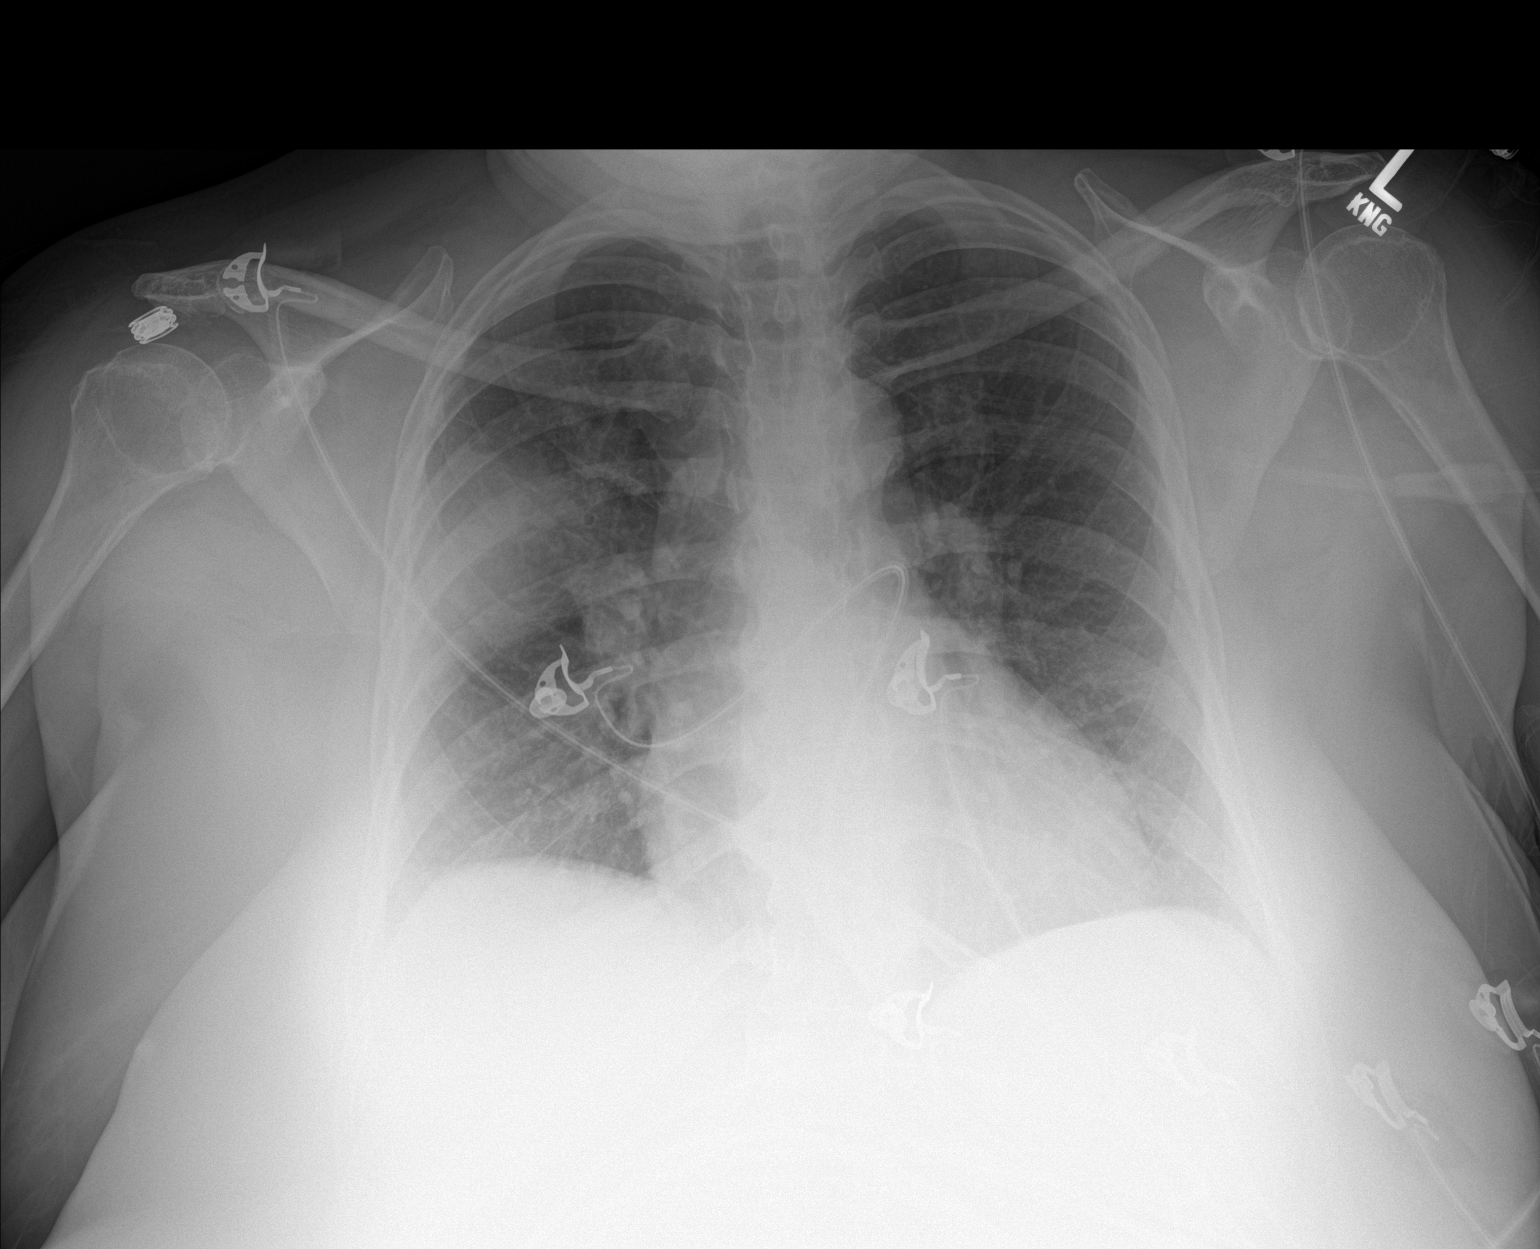

[1 of 1 positions shown; findings below may reference images not displayed]

FINDINGS: New large consolidation RIGHT mid upper lung zone. Mild bronchitic
changes. Cardiomediastinal silhouette is normal. No pneumothorax.
Large body habitus. Osseous structure nonsuspicious.
IMPRESSION: New large RIGHT lung consolidation concerning for bronchopneumonia.
Followup PA and lateral chest X-ray is recommended in 3-4 weeks
following trial of antibiotic therapy to ensure resolution and
exclude underlying malignancy.

## 2017-10-15 IMAGING — DX DG CHEST 2V
2 series · 2 of 2 positions shown · non-contrast
Comparison: Yesterday

CLINICAL DATA: Worsening shortness of breath

EXAM:
CHEST  2 VIEW

[chest pa]
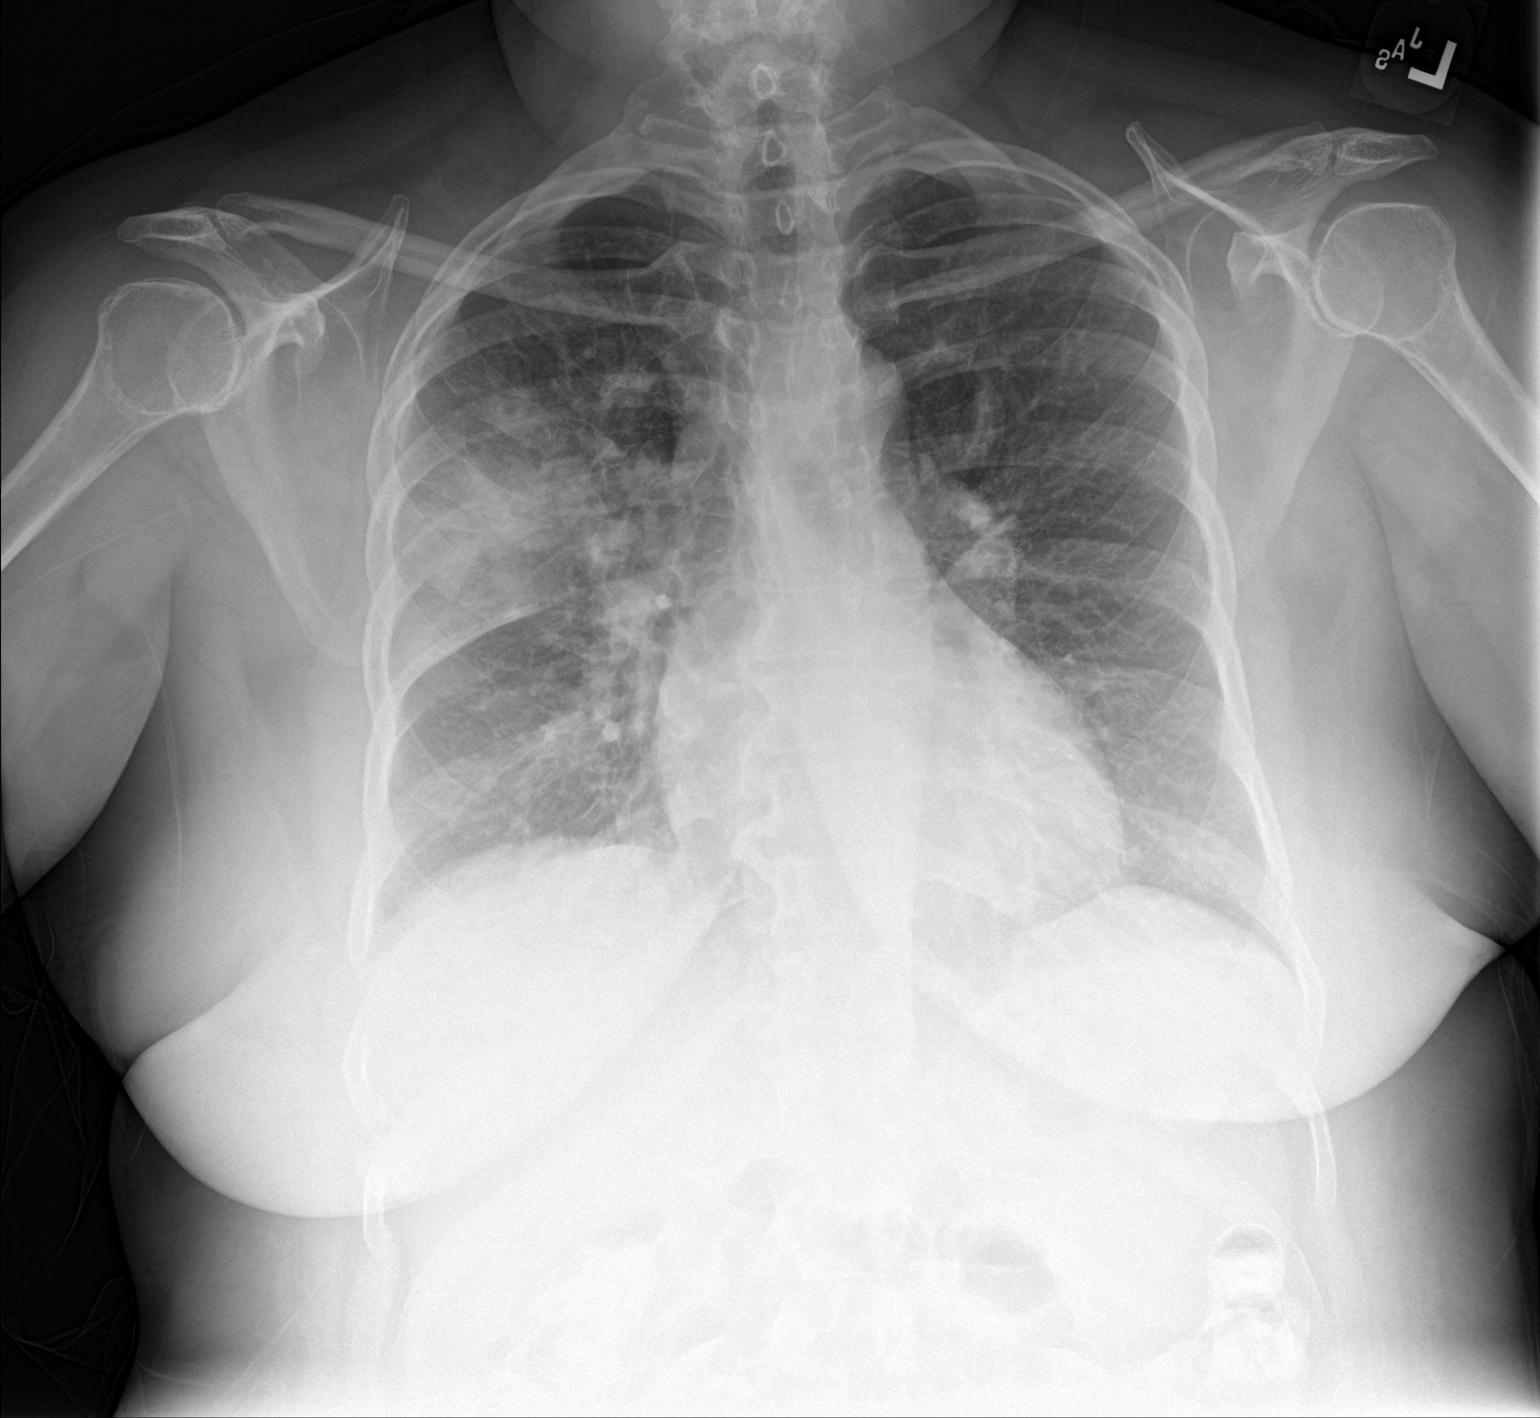

[chest lat]
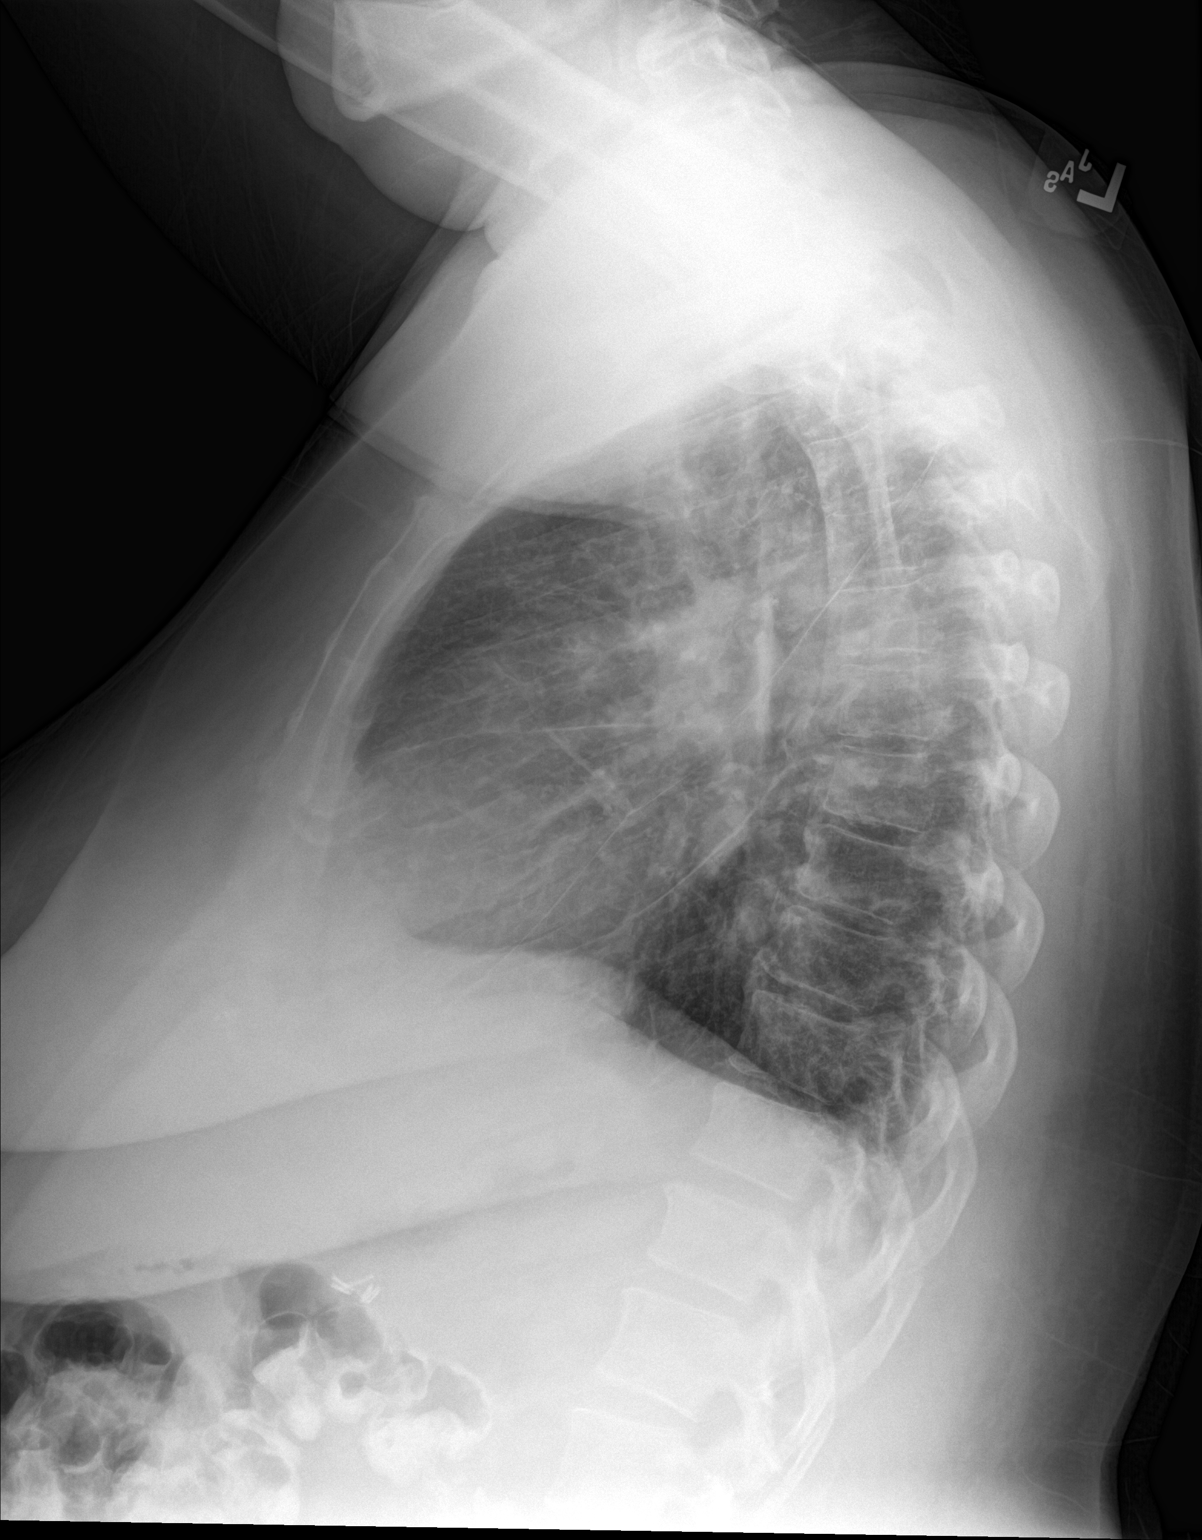

[2 of 2 positions shown; findings below may reference images not displayed]

FINDINGS: Unchanged right upper lobe acute airspace disease consistent with
pneumonia. Increased interstitial coarsening and fissural
thickening. Normal heart size and mediastinal contours. No effusion
or pneumothorax.
IMPRESSION: 1. Essentially stable right upper lobe pneumonia.
2. New interstitial coarsening suggesting early edema.

## 2017-10-17 IMAGING — CT CT CHEST W/O CM
2 of 4 series · 15 of 36 positions shown, 18 images · non-contrast
Comparison: Chest x-ray 09/12/2015.

CLINICAL DATA: Shortness of breath, left-sided chest pain, fever
and cough.

EXAM:
CT CHEST WITHOUT CONTRAST
TECHNIQUE: Multidetector CT imaging of the chest was performed following the
standard protocol without IV contrast.

[Series 4: chest w/o 3mm st cor · coronal · non-contrast · 0.51mm/px · 3 of 77 slices shown]
[im 16/77  lung]
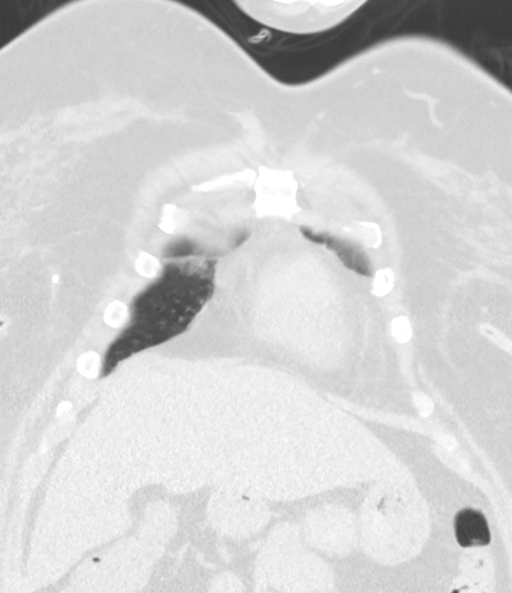
[im 31/77  lung]
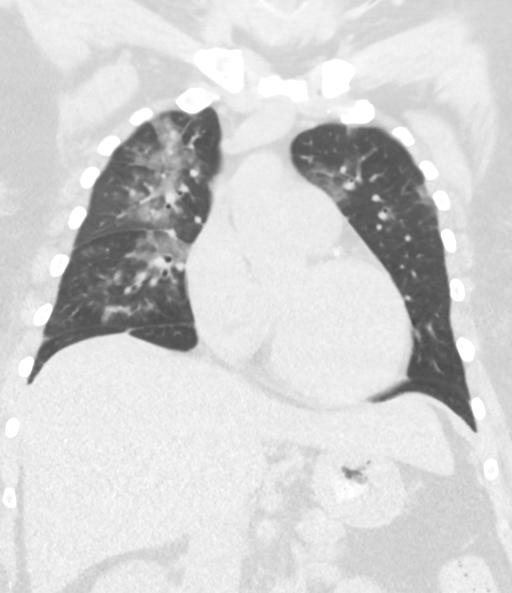
[im 46/77  lung]
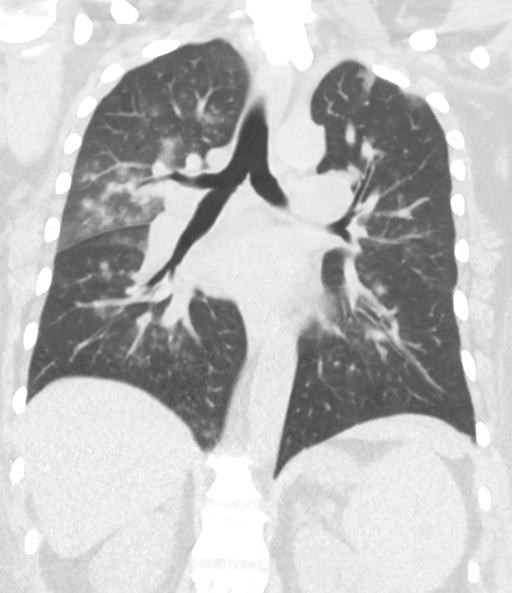

[Series 6: chest w/o 1mm st · axial · non-contrast · 0.59mm/px · z∈[-981,-712]mm · 12 of 376 slices shown, 15 images]
[im 20/376  mediastinal]
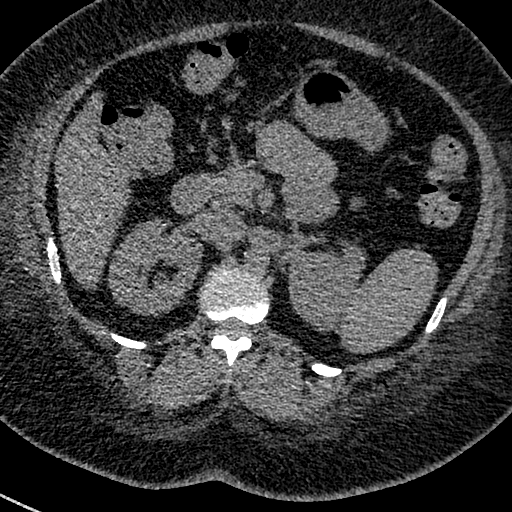
[im 20/376  lung]
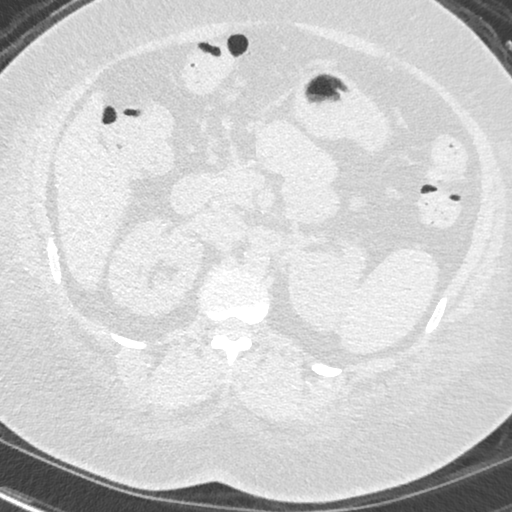
[im 60/376  lung]
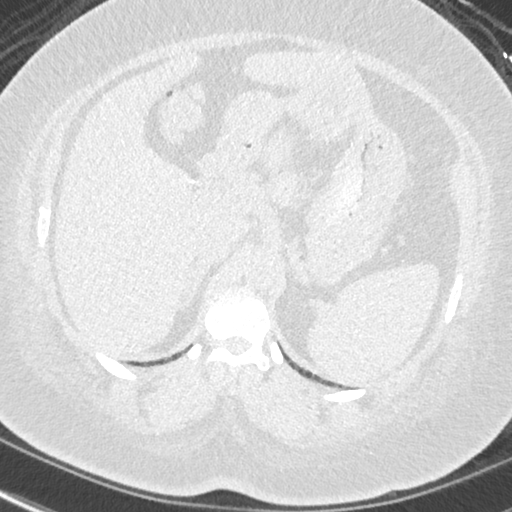
[im 79/376  lung]
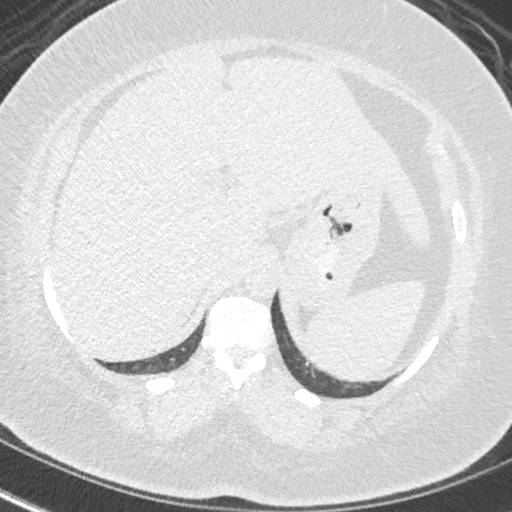
[im 119/376  lung]
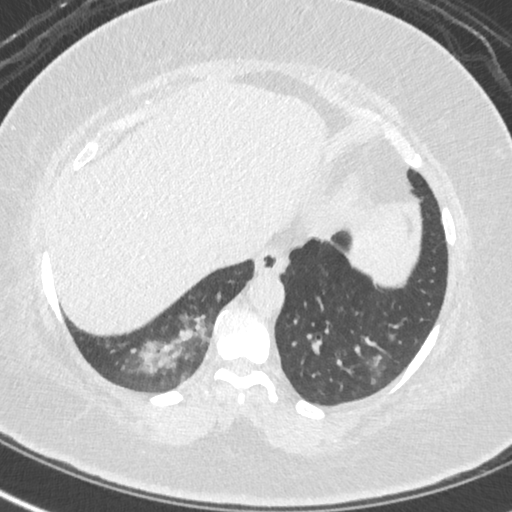
[im 139/376  mediastinal]
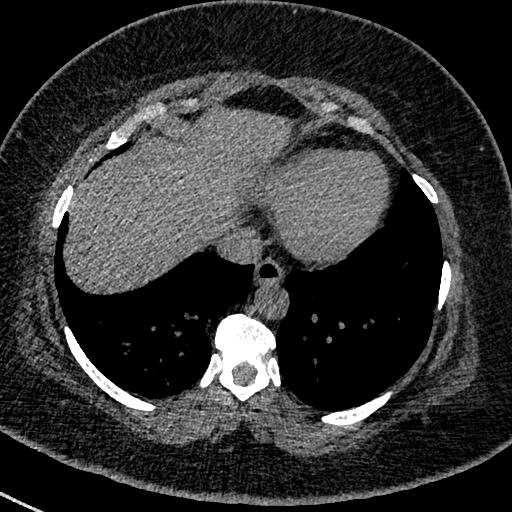
[im 139/376  lung]
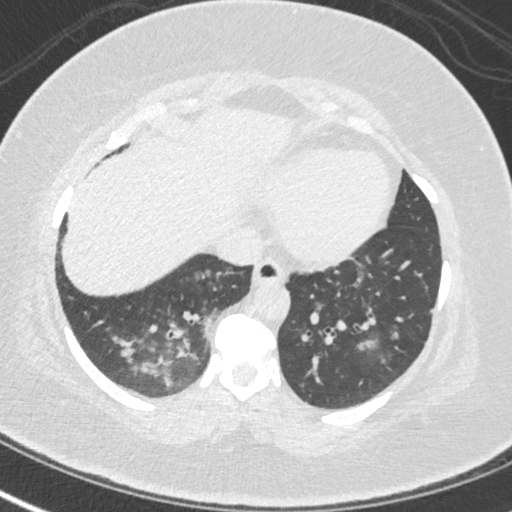
[im 178/376  lung]
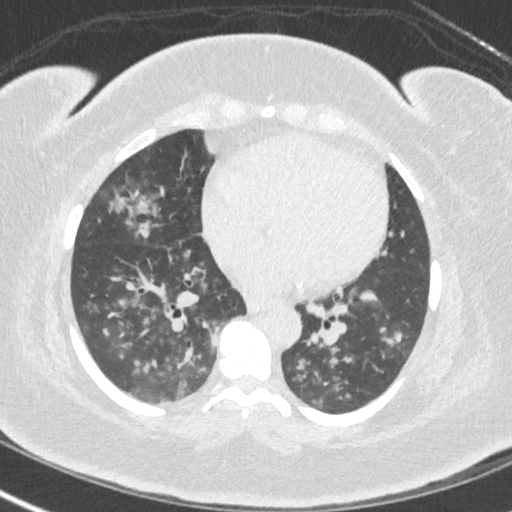
[im 198/376  lung]
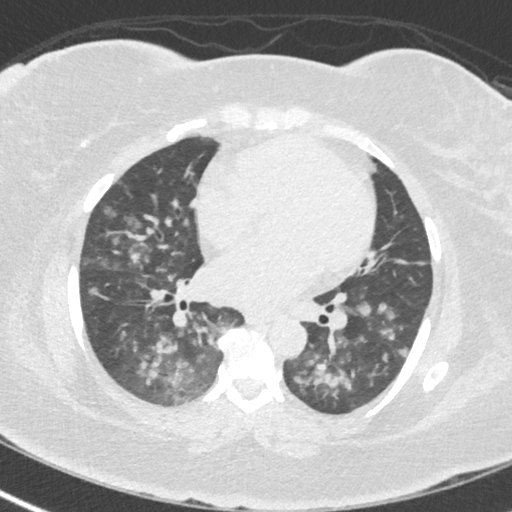
[im 237/376  lung]
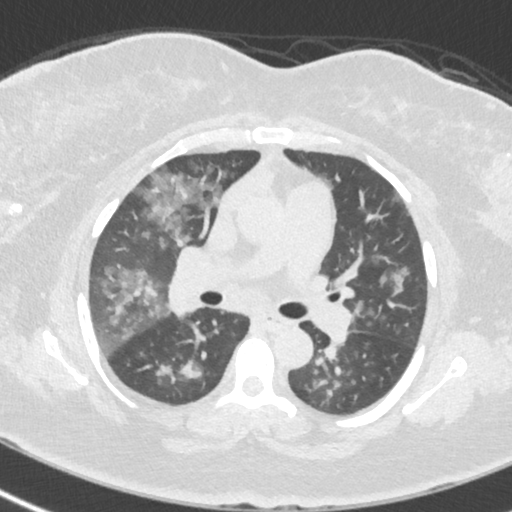
[im 257/376  mediastinal]
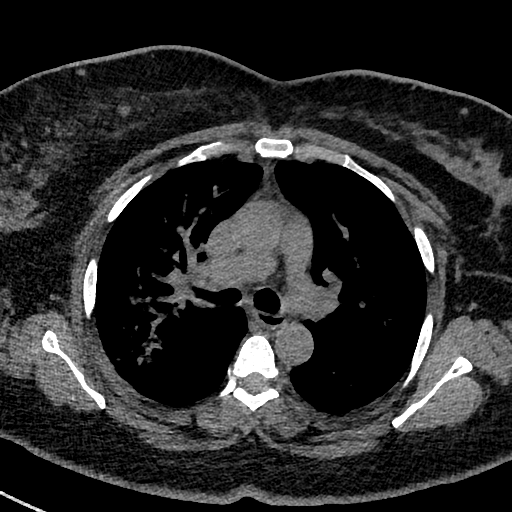
[im 257/376  lung]
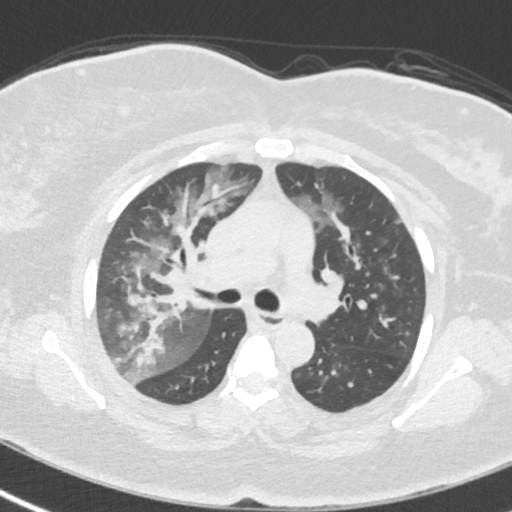
[im 297/376  lung]
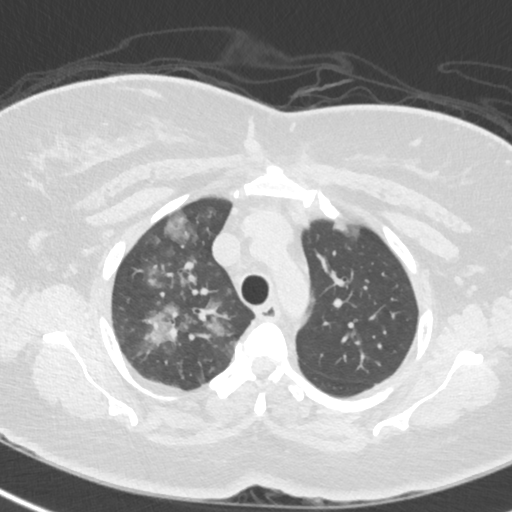
[im 316/376  lung]
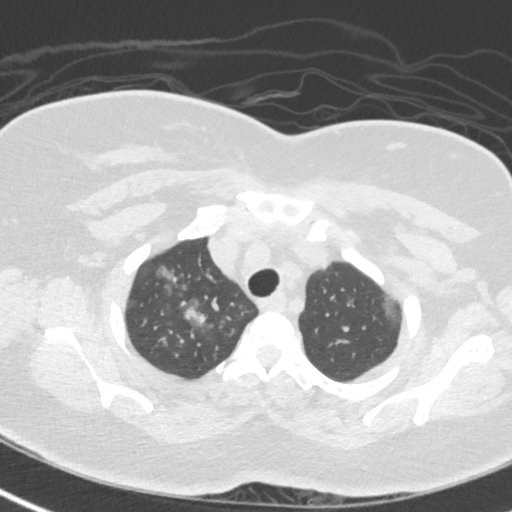
[im 356/376  lung]
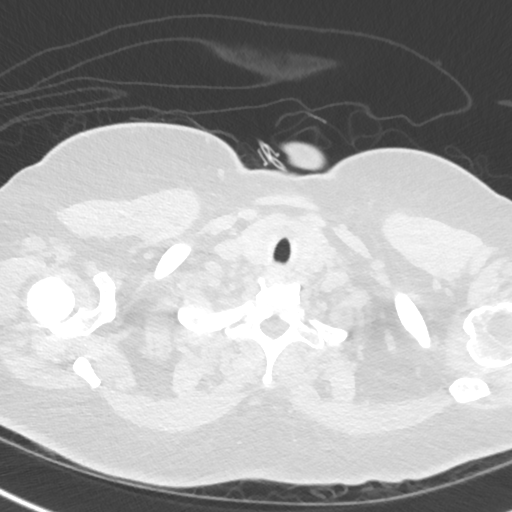

[15 of 36 positions shown; findings below may reference images not displayed]

FINDINGS: Mediastinum/Nodes: No breast masses, supraclavicular or axillary
lymphadenopathy. Small scattered lymph nodes are noted bilaterally.
Dense/ macro calcification noted in the right breast.

The heart is normal in size. No pericardial effusion. Age advanced
coronary artery calcifications. The aorta is normal in caliber.

Borderline mediastinal and hilar lymph nodes likely hyperplastic/
inflammatory related to the lung disease. No mediastinal mass. The
esophagus is grossly normal.

Lungs/Pleura: Patchy bilateral airspace process most notable in the
right upper lobe with more confluent consolidation and air
bronchograms. Findings most likely represent multifocal/polylobar
pneumonia.

No worrisome pulmonary lesions or pleural effusion.

Upper abdomen: No significant findings. Status post cholecystectomy.

Musculoskeletal: No significant bony findings.
IMPRESSION: Multifocal airspace process most likely polylobar pneumonia.

Reactive mediastinal and hilar lymph nodes.

Age advanced coronary artery calcifications.

## 2017-10-18 IMAGING — CR DG CHEST 2V
2 series · 2 of 2 positions shown · non-contrast
Comparison: None.

CLINICAL DATA: SOB (shortness of breath) [id number], wheezing, dry
cough, fever, x 2 days

EXAM:
CHEST  2 VIEW

[chest lat]
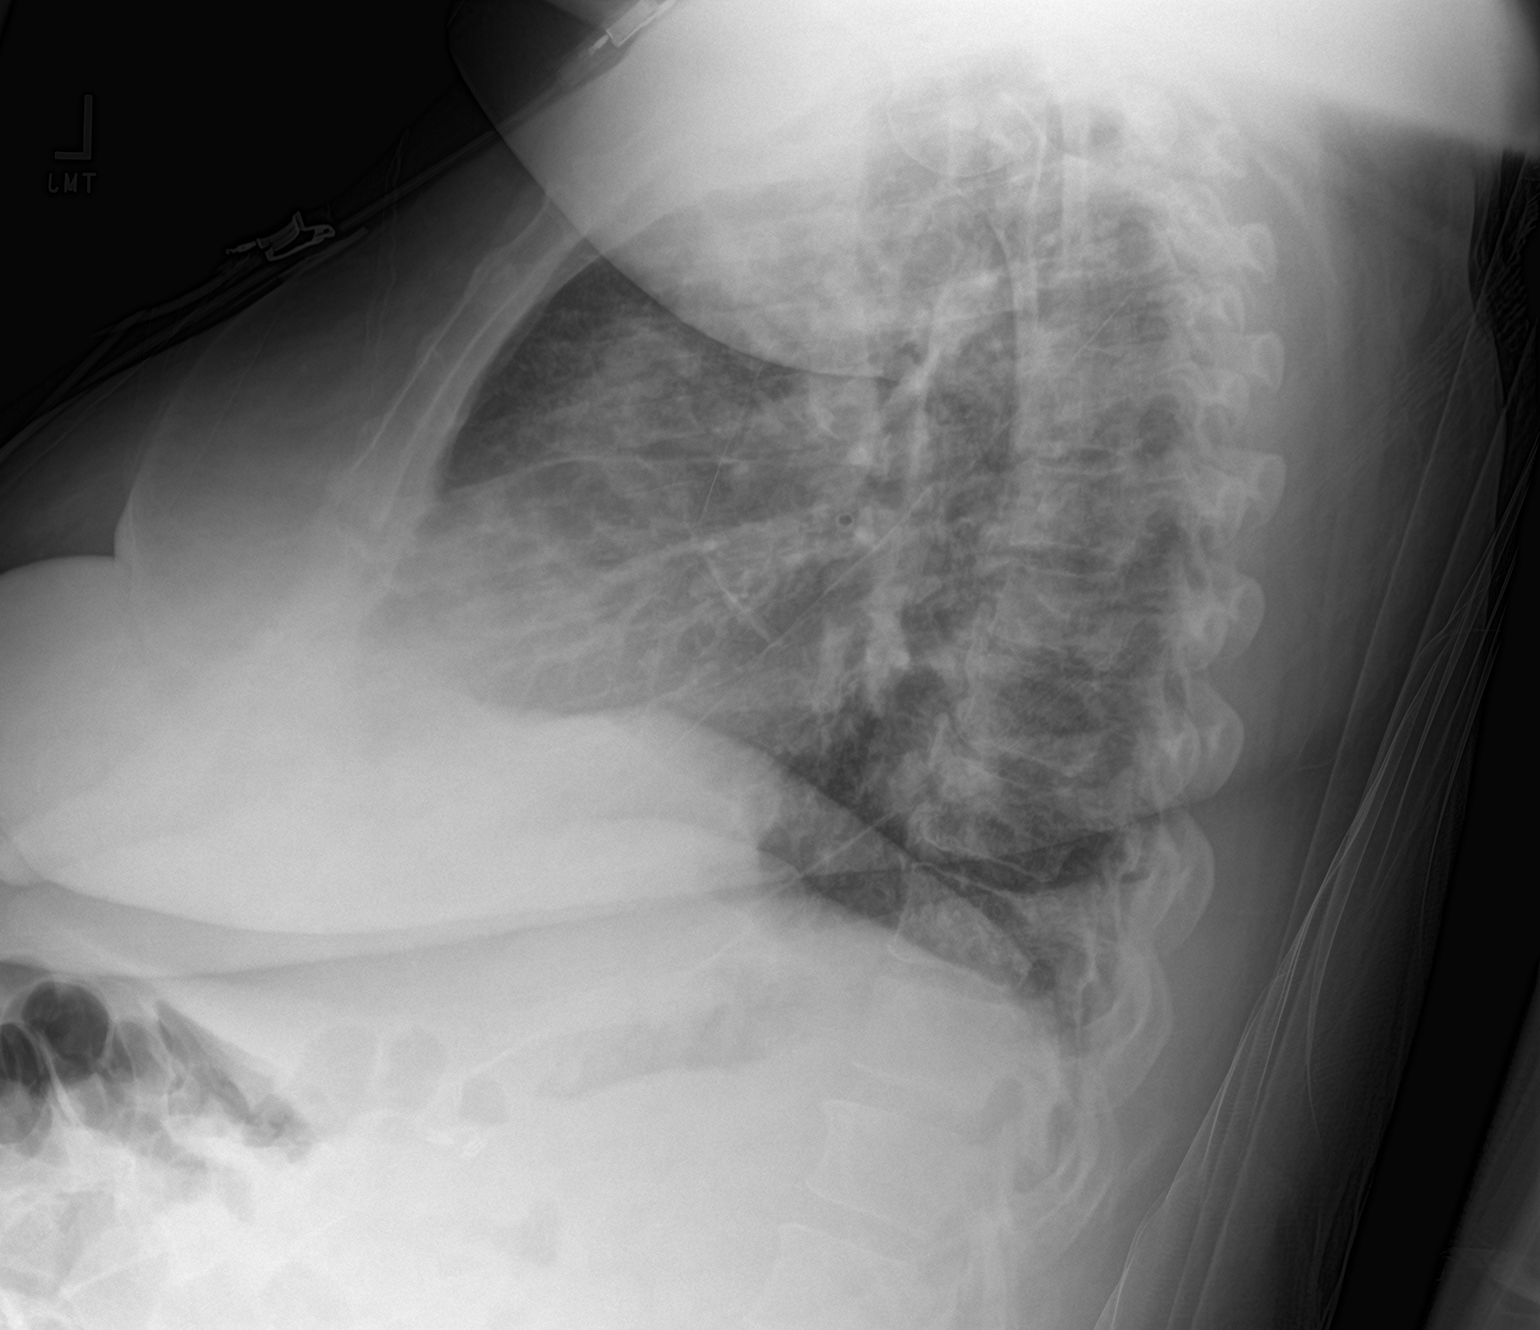

[chest ap]
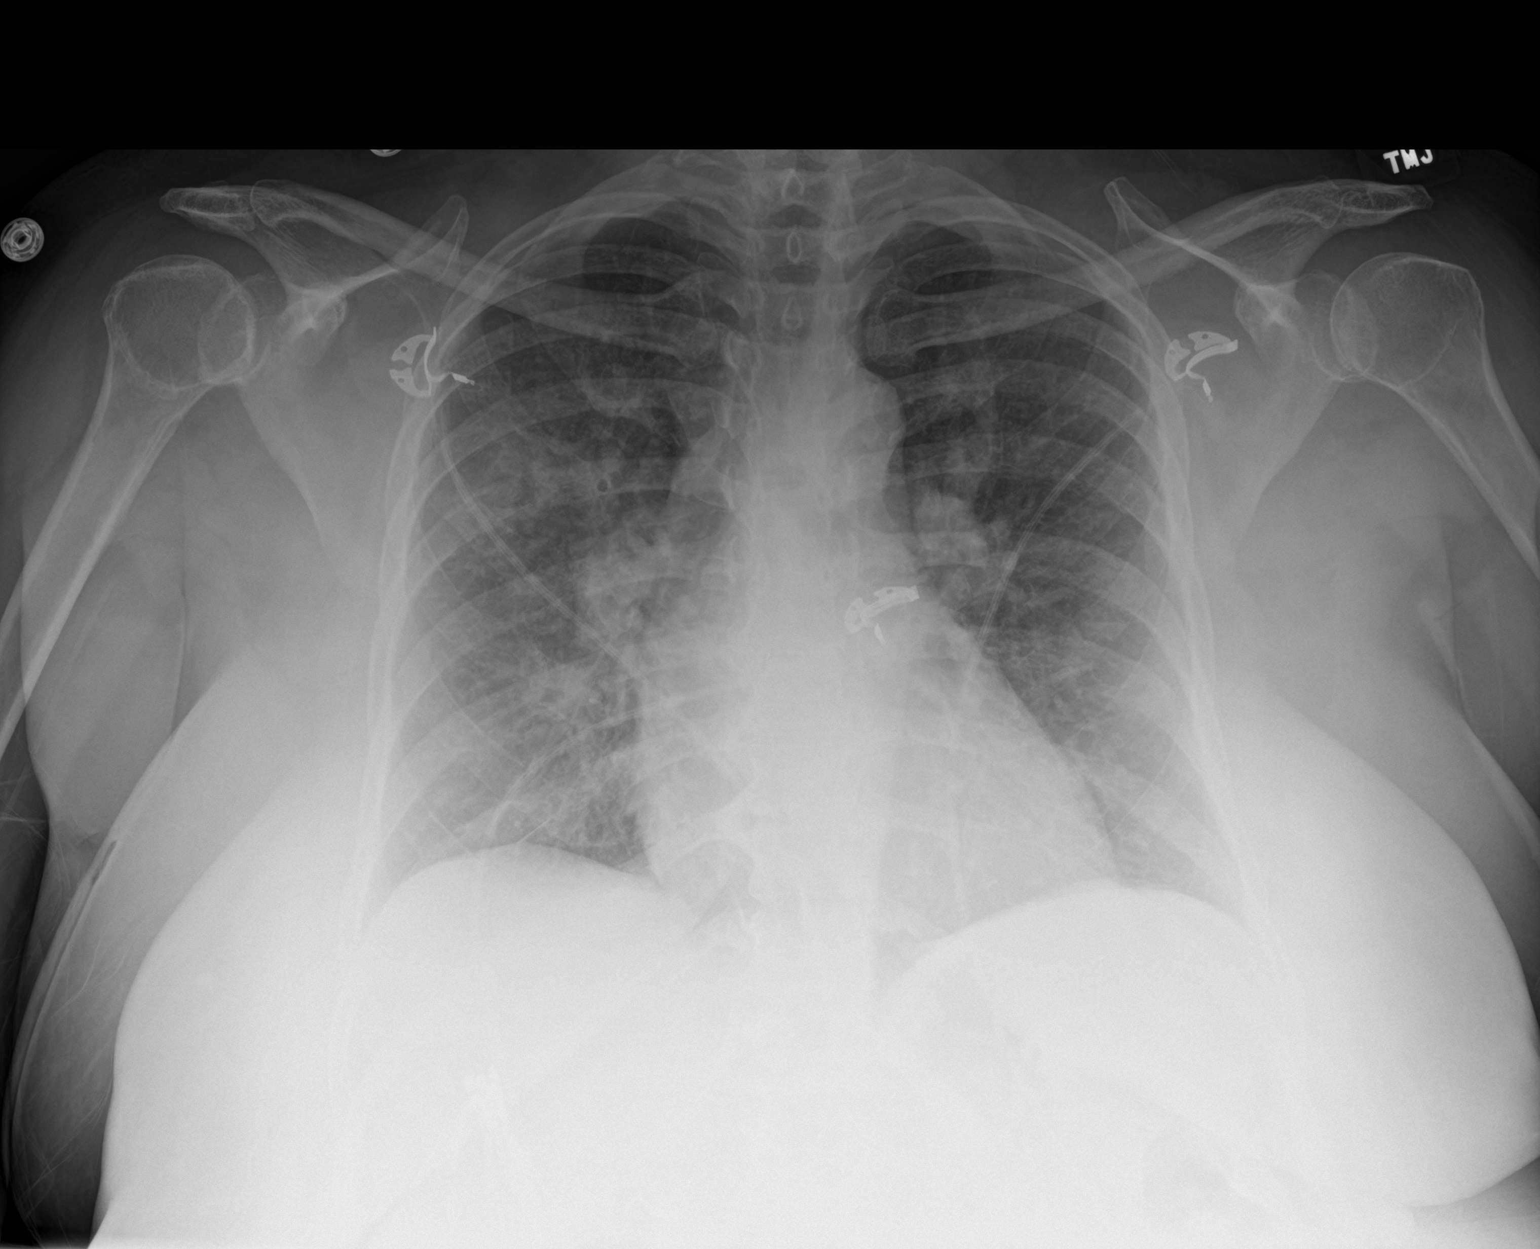

[2 of 2 positions shown; findings below may reference images not displayed]

FINDINGS: Bilateral patchy airspace disease in the upper and lower lobes most
consistent with multi lobar pneumonia. There is no pleural effusion
or pneumothorax. The heart and mediastinal contours are
unremarkable.

The osseous structures are unremarkable.
IMPRESSION: Bilateral patchy airspace disease in the upper and lower lobes most
consistent with multi lobar pneumonia.

## 2017-10-19 DIAGNOSIS — J019 Acute sinusitis, unspecified: Secondary | ICD-10-CM | POA: Insufficient documentation

## 2017-10-19 DIAGNOSIS — R0982 Postnasal drip: Secondary | ICD-10-CM | POA: Insufficient documentation

## 2017-10-19 DIAGNOSIS — K219 Gastro-esophageal reflux disease without esophagitis: Secondary | ICD-10-CM | POA: Insufficient documentation

## 2017-10-23 NOTE — Progress Notes (Signed)
[facility]  Referring Physician: Dr.Berry Primary Cardiologist: Dr. Gwenlyn Found  PCP: Dr. Ardeth Perfect HF MD: Dr Haroldine Laws  HPI: Amy [name] is a 57 y.o. female with obesity, HTN, diabetes with legal blindness, HL, OSA and diastolic HF.  She was referred by Dr. Gwenlyn Found for further evaluation and management of diastolic HF.   She had a negative Myoview 10/10/14 and a 2-D echo performed 07/06/15 that revealed normal LV/RV function and grade 2 diastolic dysfunction.  Today she returns for HF follow up. Last visit, torsemide was decreased to 40 mg daily with creatinine up to 2.0. Overall doing okay today. She is going to the [facility] 2x/week and doing chair exercises. She is a little more SOB with walking and ADLs. She took a metolazone last week for 5 lb weight gain and edema and had a little improvement. She has chronic orthopnea. She wears CPAP every night. She has BLE edema today. Denies CP or dizziness. Weights have increased 207 -> 212 lbs at home. She eats some salty foods. Typically limits her fluid to less than 2 L. She does not always take her potassium pills, but is otherwise compliant with medications.   Review of systems complete and found to be negative unless listed in HPI.    Past Medical History:  Diagnosis Date  . Arthritis   . Asthma   . Blind   . Blindness and low vision    left eye glass eye,  legally blind in right eye  . Diabetes mellitus   . Diabetic neuropathy ([place])   . Hyperlipidemia   . Hypertension   . Hypothyroidism   . Spinal stenosis     Current Outpatient Medications  Medication Sig Dispense Refill  . acetaminophen (TYLENOL) 325 MG tablet Take 2 tablets (650 mg total) by mouth every 6 (six) hours as needed for mild pain (or Fever >/= 101). 30 tablet 0  . albuterol (PROVENTIL HFA;VENTOLIN HFA) 108 (90 BASE) MCG/ACT inhaler Inhale 2 puffs into the lungs every 4 (four) hours as needed for wheezing or shortness of breath. For short    . aspirin EC 81 MG  tablet Take 81 mg by mouth daily.    . cetirizine (ZYRTEC) 10 MG tablet Take 10 mg by mouth daily.    Marland Kitchen esomeprazole (NEXIUM) 40 MG capsule Take 40 mg by mouth daily at 12 noon.     . fluorometholone (FML FORTE) 0.25 % ophthalmic suspension Place 1 drop into the right eye 2 (two) times daily.    . fluticasone (FLONASE) 50 MCG/ACT nasal spray Place into both nostrils daily.    Marland Kitchen HUMALOG KWIKPEN 100 UNIT/ML KiwkPen     . insulin degludec (TRESIBA FLEXTOUCH) 100 UNIT/ML SOPN FlexTouch Pen Inject 60 Units into the skin daily at 10 pm.    . levothyroxine (SYNTHROID, LEVOTHROID) 150 MCG tablet Take 150 mcg by mouth daily before breakfast.    . Linaclotide (LINZESS) 145 MCG CAPS capsule Take 145 mcg by mouth daily as needed (constipation).     Marland Kitchen LYRICA 150 MG capsule Take 150 mg by mouth 3 (three) times daily.    . metolazone (ZAROXOLYN) 5 MG tablet TAKE 1 TABLET BY MOUTH DAILY AS NEEDED. TAKE FOR WEIGHT GAIN OF 3 LBS OVERNIGHT OR 5 LBS IN A WEEK 30 tablet 1  . ondansetron (ZOFRAN) 4 MG tablet 4 mg. As needed    . potassium chloride SA (K-DUR,KLOR-CON) 20 MEQ tablet Take 2 tablets (40 mEq total) by mouth daily. (Patient taking  differently: Take 20 mEq by mouth daily. ) 65 tablet 3  . spironolactone (ALDACTONE) 25 MG tablet Take 0.5 tablets (12.5 mg total) by mouth daily. 45 tablet 3  . torsemide (DEMADEX) 20 MG tablet Take 2 tablets (40 mg total) by mouth daily. 150 tablet 11  . triamcinolone cream (KENALOG) 0.1 % Apply 1 application topically 2 (two) times daily.    . urea (CARMOL) 40 % CREA Apply finger tip to effected area 1 each 1   No current facility-administered medications for this encounter.     Allergies  Allergen Reactions  . Penicillins Hives and Swelling    Has patient had a PCN reaction causing immediate rash, facial/tongue/throat swelling, SOB or lightheadedness with hypotension: yes- face swelling Has patient had a PCN reaction causing severe rash involving mucus membranes or skin  necrosis: no Has patient had a PCN reaction that required hospitalization unknown (childhood allergy) Has patient had a PCN reaction occurring within the last 10 years: no If all of the above answers are "NO", then may proceed with Cephalosporin use.   . Iohexol Hives    pt developed itching and hives along with nasal congestion; needs 13 hour premeds for future studies, Onset Date: [id number]   . Codeine Nausea Only  . Iodine-131 Hives    hives  . Lisinopril Cough  . Sulfa Antibiotics Nausea And Vomiting      Social History   Socioeconomic History  . Marital status: Single    Spouse name: Not on file  . Number of children: 2  . Years of education: 64  . Highest education level: Not on file  Occupational History  . Occupation: N/A    Comment: disabled  Social Needs  . Financial resource strain: Not on file  . Food insecurity:    Worry: Not on file    Inability: Not on file  . Transportation needs:    Medical: Not on file    Non-medical: Not on file  Tobacco Use  . Smoking status: Former Smoker    Types: Cigarettes    Last attempt to quit: 05/22/1992    Years since quitting: 25.4  . Smokeless tobacco: Never Used  Substance and Sexual Activity  . Alcohol use: No    Comment: quit 20 yrs ago  . Drug use: No  . Sexual activity: Not on file  Lifestyle  . Physical activity:    Days per week: Not on file    Minutes per session: Not on file  . Stress: Not on file  Relationships  . Social connections:    Talks on phone: Not on file    Gets together: Not on file    Attends religious service: Not on file    Active member of club or organization: Not on file    Attends meetings of clubs or organizations: Not on file    Relationship status: Not on file  . Intimate partner violence:    Fear of current or ex partner: Not on file    Emotionally abused: Not on file    Physically abused: Not on file    Forced sexual activity: Not on file  Other Topics Concern  . Not on file    Social History Narrative   Lives alone   caffeine drinks 2 cups of coffee a day, occasional soda    Family History  Problem Relation Age of Onset  . Diabetes Sister   . Glaucoma Sister   . Hypertension Sister   . Stroke Brother   .  Heart attack Paternal Aunt    Vitals:   10/24/17 0838  BP: 124/70  Pulse: 72  SpO2: 95%  Weight: 212 lb (96.2 kg)   Wt Readings from Last 3 Encounters:  10/24/17 212 lb (96.2 kg)  09/06/17 210 lb 9.6 oz (95.5 kg)  07/27/17 213 lb 9.6 oz (96.9 kg)    PHYSICAL EXAM: General: Appears chronically ill. No resp difficulty. HEENT: Normal Neck: Supple. JVP ~8. Carotids 2+ bilat; no bruits. No thyromegaly or nodule noted. Cor: PMI nondisplaced. RRR, No M/G/R noted Lungs: CTAB, normal effort. Abdomen: Soft, non-tender, + distended, no HSM. No bruits or masses. +BS  Extremities: No cyanosis, clubbing, or rash. R and LLE 1+ edema Neuro: Alert & orientedx3, cranial nerves grossly intact. moves all 4 extremities w/o difficulty. Affect pleasant   ASSESSMENT & PLAN: 1. Chronic diastolic HF - Echo 4/66 Normal LV/R function Grade II DD - NYHA III - Volume status mildly elevated. Weight is up 2 lbs on our scale. - Increase torsemide to 40 mg BID x 2 days, then back to torsemide 20 mg BID. Discussed sliding scale diuretic regimen.  - Increase spiro to 25 mg daily. BMET today and in 7 days.  - Reinforced daily weights, low salt food choices, and limiting fluid intake to < 2 liters per day.  2. HTN - Well controlled today.  3. DM2 - Per PCP.  - With HF would strongly consider Jardiance. No change 4. OSA  - Continue CPAP qhs. Wearing every night.  5. CKD III - BMET today  Increase torsemide to 40 mg BID, then back to 20 mg BID. Will adjust potassium depending on labs, since she is not taking regularly.  Increase spiro to 25 mg daily. BMET today in 7 days. Follow up in 8 weeks, sooner with symptoms.    Georgiana Shore, NP  8:40 AM  Greater than 50%  of the 25 minute visit was spent in counseling/coordination of care regarding disease state education, salt/fluid restriction, sliding scale diuretics, and medication compliance.

## 2017-10-24 ENCOUNTER — Ambulatory Visit (HOSPITAL_COMMUNITY)
Admission: RE | Admit: 2017-10-24 | Discharge: 2017-10-24 | Disposition: A | Discharge status: Home / Self Care | Payer: Medicare Other | Source: Ambulatory Visit | Attending: Cardiology | Admitting: Cardiology

## 2017-10-24 VITALS — BP 124/70 | HR 72 | Wt 212.0 lb

## 2017-10-24 DIAGNOSIS — I5032 Chronic diastolic (congestive) heart failure: Secondary | ICD-10-CM

## 2017-10-24 DIAGNOSIS — Z833 Family history of diabetes mellitus: Secondary | ICD-10-CM | POA: Diagnosis not present

## 2017-10-24 DIAGNOSIS — I130 Hypertensive heart and chronic kidney disease with heart failure and stage 1 through stage 4 chronic kidney disease, or unspecified chronic kidney disease: Secondary | ICD-10-CM | POA: Insufficient documentation

## 2017-10-24 DIAGNOSIS — Z880 Allergy status to penicillin: Secondary | ICD-10-CM | POA: Insufficient documentation

## 2017-10-24 DIAGNOSIS — Z87891 Personal history of nicotine dependence: Secondary | ICD-10-CM | POA: Diagnosis not present

## 2017-10-24 DIAGNOSIS — E039 Hypothyroidism, unspecified: Secondary | ICD-10-CM | POA: Diagnosis not present

## 2017-10-24 DIAGNOSIS — G4733 Obstructive sleep apnea (adult) (pediatric): Secondary | ICD-10-CM | POA: Diagnosis not present

## 2017-10-24 DIAGNOSIS — Z885 Allergy status to narcotic agent status: Secondary | ICD-10-CM | POA: Insufficient documentation

## 2017-10-24 DIAGNOSIS — Z9989 Dependence on other enabling machines and devices: Secondary | ICD-10-CM | POA: Diagnosis not present

## 2017-10-24 DIAGNOSIS — E669 Obesity, unspecified: Secondary | ICD-10-CM | POA: Insufficient documentation

## 2017-10-24 DIAGNOSIS — E118 Type 2 diabetes mellitus with unspecified complications: Secondary | ICD-10-CM

## 2017-10-24 DIAGNOSIS — E1122 Type 2 diabetes mellitus with diabetic chronic kidney disease: Secondary | ICD-10-CM | POA: Insufficient documentation

## 2017-10-24 DIAGNOSIS — Z888 Allergy status to other drugs, medicaments and biological substances status: Secondary | ICD-10-CM | POA: Insufficient documentation

## 2017-10-24 DIAGNOSIS — Z823 Family history of stroke: Secondary | ICD-10-CM | POA: Diagnosis not present

## 2017-10-24 DIAGNOSIS — Z882 Allergy status to sulfonamides status: Secondary | ICD-10-CM | POA: Insufficient documentation

## 2017-10-24 DIAGNOSIS — Z79899 Other long term (current) drug therapy: Secondary | ICD-10-CM | POA: Insufficient documentation

## 2017-10-24 DIAGNOSIS — N183 Chronic kidney disease, stage 3 unspecified: Secondary | ICD-10-CM

## 2017-10-24 DIAGNOSIS — Z7982 Long term (current) use of aspirin: Secondary | ICD-10-CM | POA: Diagnosis not present

## 2017-10-24 DIAGNOSIS — E785 Hyperlipidemia, unspecified: Secondary | ICD-10-CM | POA: Insufficient documentation

## 2017-10-24 DIAGNOSIS — I10 Essential (primary) hypertension: Secondary | ICD-10-CM

## 2017-10-24 DIAGNOSIS — Z8249 Family history of ischemic heart disease and other diseases of the circulatory system: Secondary | ICD-10-CM | POA: Diagnosis not present

## 2017-10-24 DIAGNOSIS — J45909 Unspecified asthma, uncomplicated: Secondary | ICD-10-CM | POA: Insufficient documentation

## 2017-10-24 DIAGNOSIS — Z09 Encounter for follow-up examination after completed treatment for conditions other than malignant neoplasm: Secondary | ICD-10-CM | POA: Diagnosis present

## 2017-10-24 LAB — BASIC METABOLIC PANEL
Anion gap: 8 (ref 5–15)
BUN: 52 mg/dL — AB (ref 6–20)
CO2: 32 mmol/L (ref 22–32)
CREATININE: 1.85 mg/dL — AB (ref 0.44–1.00)
Calcium: 9.6 mg/dL (ref 8.9–10.3)
Chloride: 104 mmol/L (ref 101–111)
GFR calc Af Amer: 34 mL/min — ABNORMAL LOW (ref >60)
GFR calc non Af Amer: 29 mL/min — ABNORMAL LOW (ref >60)
GLUCOSE: 294 mg/dL — AB (ref 65–99)
Potassium: 4.2 mmol/L (ref 3.5–5.1)
SODIUM: 144 mmol/L (ref 135–145)

## 2017-10-24 MED ORDER — TORSEMIDE 20 MG PO TABS: 40.0000 mg | ORAL_TABLET | Freq: Every day | ORAL | 11 refills | Status: DC

## 2017-10-24 MED ORDER — SPIRONOLACTONE 25 MG PO TABS: 25.0000 mg | ORAL_TABLET | Freq: Every day | ORAL | 3 refills | Status: DC

## 2017-10-24 NOTE — Patient Instructions (Signed)
Routine lab work today. Will notify you of abnormal results, otherwise no news is good news!  INCREASE Torsemide to 40 mg (2 tabs) TWICE DAILY for TWO DAYS. Then reduce back to normal daily dose of 40 mg ONCE DAILY (1 tab twice daily, or 2 tabs once daily).  INCREASE Spironolactone to 25 mg (1 whole tablet) once daily.  Return in 1 week for labs.  ________________________________________________________________________ Amy [name] Code: 1300  Follow up 8 weeks.  ________________________________________________________________________ Amy [name] Code:  Take all medication as prescribed the day of your appointment. Bring all medications with you to your appointment.  Do the following things EVERYDAY: 1) Weigh yourself in the morning before breakfast. Write it down and keep it in a log. 2) Take your medicines as prescribed 3) Eat low salt foods-Limit salt (sodium) to 2000 mg per day.  4) Stay as active as you can everyday 5) Limit all fluids for the day to less than 2 liters

## 2017-10-30 ENCOUNTER — Ambulatory Visit (HOSPITAL_COMMUNITY)
Admission: RE | Admit: 2017-10-30 | Discharge: 2017-10-30 | Disposition: A | Discharge status: Home / Self Care | Payer: Medicare Other | Source: Ambulatory Visit | Attending: Cardiology | Admitting: Cardiology

## 2017-10-30 ENCOUNTER — Telehealth (HOSPITAL_COMMUNITY): Payer: Self-pay | Admitting: Cardiology

## 2017-10-30 DIAGNOSIS — I5032 Chronic diastolic (congestive) heart failure: Secondary | ICD-10-CM

## 2017-10-30 LAB — BASIC METABOLIC PANEL
Anion gap: 10 (ref 5–15)
BUN: 63 mg/dL — AB (ref 6–20)
CALCIUM: 9.8 mg/dL (ref 8.9–10.3)
CO2: 32 mmol/L (ref 22–32)
Chloride: 100 mmol/L — ABNORMAL LOW (ref 101–111)
Creatinine, Ser: 2.35 mg/dL — ABNORMAL HIGH (ref 0.44–1.00)
GFR calc Af Amer: 25 mL/min — ABNORMAL LOW (ref >60)
GFR, EST NON AFRICAN AMERICAN: 22 mL/min — AB (ref >60)
Glucose, Bld: 151 mg/dL — ABNORMAL HIGH (ref 65–99)
Potassium: 3.9 mmol/L (ref 3.5–5.1)
SODIUM: 142 mmol/L (ref 135–145)

## 2017-10-30 MED ORDER — SPIRONOLACTONE 25 MG PO TABS: 12.5000 mg | ORAL_TABLET | Freq: Every day | ORAL | 3 refills | Status: DC

## 2017-10-30 NOTE — Telephone Encounter (Signed)
-----   Message from Shirley Friar, PA-C sent at 10/30/2017  1:31 PM EDT ----- With worsening creatinine and K would HOLD torsemide x 2 days, hold ALL spiro for 2 days, then resume at 12.5 mg daily with BMET next week.  Reviewed with Caryl Pina as well.     Legrand Como [address] [employer], PA-C 10/30/2017 1:31 PM

## 2017-10-30 NOTE — Telephone Encounter (Signed)
Notes recorded by Kerry Dory, CMA on 10/30/2017 at 5:00 PM EDT Pt aware and voiced understanding ------  Notes recorded by Shirley Friar, PA-C on 10/30/2017 at 1:31 PM EDT With worsening creatinine and K would HOLD torsemide x 2 days, hold ALL spiro for 2 days, then resume at 12.5 mg daily with BMET next week. Reviewed with Caryl Pina as well.     Legrand Como [address] [place], PA-C 10/30/2017 1:31 PM ------  Notes recorded by Georgiana Shore, NP on 10/30/2017 at 1:08 PM EDT Please have her cut spiro back to 12.5 and repeat BMET in one week. Thanks!

## 2017-11-06 ENCOUNTER — Ambulatory Visit (HOSPITAL_COMMUNITY)
Admission: RE | Admit: 2017-11-06 | Discharge: 2017-11-06 | Disposition: A | Discharge status: Home / Self Care | Payer: Medicare Other | Source: Ambulatory Visit | Attending: Internal Medicine | Admitting: Internal Medicine

## 2017-11-06 ENCOUNTER — Telehealth (HOSPITAL_COMMUNITY): Payer: Self-pay | Admitting: *Deleted

## 2017-11-06 DIAGNOSIS — I5032 Chronic diastolic (congestive) heart failure: Secondary | ICD-10-CM | POA: Diagnosis present

## 2017-11-06 LAB — BASIC METABOLIC PANEL
ANION GAP: 10 (ref 5–15)
BUN: 32 mg/dL — ABNORMAL HIGH (ref 6–20)
CO2: 31 mmol/L (ref 22–32)
Calcium: 9.1 mg/dL (ref 8.9–10.3)
Chloride: 102 mmol/L (ref 101–111)
Creatinine, Ser: 1.79 mg/dL — ABNORMAL HIGH (ref 0.44–1.00)
GFR calc Af Amer: 35 mL/min — ABNORMAL LOW (ref >60)
GFR, EST NON AFRICAN AMERICAN: 30 mL/min — AB (ref >60)
Glucose, Bld: 124 mg/dL — ABNORMAL HIGH (ref 65–99)
POTASSIUM: 3.3 mmol/L — AB (ref 3.5–5.1)
SODIUM: 143 mmol/L (ref 135–145)

## 2017-11-06 MED ORDER — POTASSIUM CHLORIDE CRYS ER 20 MEQ PO TBCR: 40.0000 meq | EXTENDED_RELEASE_TABLET | Freq: Every day | ORAL | 3 refills | Status: DC

## 2017-11-06 NOTE — Telephone Encounter (Signed)
Advanced Heart Failure Triage Encounter  Patient Name: Amy [name]  Date of Call: 11/06/17  Problem:  Patient came in for labs today and stated that she hasn't been urinating.  She did restart Torsemide at 20 BID on 6/19 and is taking potassium 20 daily.  Patient's weight is up to 216 lbs today and was 210 on 6/20.    Plan:  Lab results today showed her kidney function is back at baseline.  Per Lillia Mountain, NP patient needs to increase potassium to 40 mEq daily, take metolazone today only and will recheck BMET next week. Patient is agreeable with plan, MAR updated, lab appt scheduled and bmet ordered.   Darron Doom, RN

## 2017-11-07 ENCOUNTER — Encounter (HOSPITAL_COMMUNITY): Payer: Self-pay | Admitting: Cardiology

## 2017-11-10 ENCOUNTER — Ambulatory Visit: Payer: Medicare Other | Admitting: Podiatry

## 2017-11-13 ENCOUNTER — Ambulatory Visit (HOSPITAL_COMMUNITY)
Admission: RE | Admit: 2017-11-13 | Discharge: 2017-11-13 | Disposition: A | Discharge status: Home / Self Care | Payer: Medicare Other | Source: Ambulatory Visit | Attending: Internal Medicine | Admitting: Internal Medicine

## 2017-11-13 DIAGNOSIS — I5032 Chronic diastolic (congestive) heart failure: Secondary | ICD-10-CM | POA: Insufficient documentation

## 2017-11-13 LAB — BASIC METABOLIC PANEL
Anion gap: 11 (ref 5–15)
BUN: 40 mg/dL — ABNORMAL HIGH (ref 6–20)
CALCIUM: 9.6 mg/dL (ref 8.9–10.3)
CHLORIDE: 101 mmol/L (ref 98–111)
CO2: 29 mmol/L (ref 22–32)
Creatinine, Ser: 2.06 mg/dL — ABNORMAL HIGH (ref 0.44–1.00)
GFR calc Af Amer: 29 mL/min — ABNORMAL LOW (ref >60)
GFR calc non Af Amer: 25 mL/min — ABNORMAL LOW (ref >60)
GLUCOSE: 177 mg/dL — AB (ref 70–99)
Potassium: 3.7 mmol/L (ref 3.5–5.1)
Sodium: 141 mmol/L (ref 135–145)

## 2017-11-14 ENCOUNTER — Other Ambulatory Visit (HOSPITAL_COMMUNITY): Payer: Self-pay | Admitting: Internal Medicine

## 2017-11-15 ENCOUNTER — Ambulatory Visit (INDEPENDENT_AMBULATORY_CARE_PROVIDER_SITE_OTHER): Payer: Medicare Other | Admitting: Podiatry

## 2017-11-15 ENCOUNTER — Encounter: Payer: Self-pay | Admitting: Podiatry

## 2017-11-15 DIAGNOSIS — L608 Other nail disorders: Secondary | ICD-10-CM

## 2017-11-15 DIAGNOSIS — B351 Tinea unguium: Secondary | ICD-10-CM | POA: Diagnosis not present

## 2017-11-15 DIAGNOSIS — E0842 Diabetes mellitus due to underlying condition with diabetic polyneuropathy: Secondary | ICD-10-CM

## 2017-11-15 DIAGNOSIS — M79676 Pain in unspecified toe(s): Secondary | ICD-10-CM | POA: Diagnosis not present

## 2017-11-15 NOTE — Progress Notes (Signed)
Patient ID: HIYA POINT, female   DOB: [birth year], 57 y.o.   MRN: [id number] Complaint:  Visit Type: Patient returns to my office for continued preventative foot care services. Complaint: Patient states" my nails have grown long and thick and become painful to walk and wear shoes" Patient has been diagnosed with DM with neuropathy.. The patient presents for preventative foot care services. No changes to ROS  Podiatric Exam: Vascular: dorsalis pedis and posterior tibial pulses are palpable bilateral. Capillary return is immediate. Temperature gradient is WNL. Skin turgor WNL  Sensorium: Normal Semmes Weinstein monofilament test. Normal tactile sensation bilaterally. Nail Exam: Pt has thick disfigured discolored nails with subungual debris noted bilateral entire nail hallux through fifth toenails Ulcer Exam: There is no evidence of ulcer or pre-ulcerative changes or infection. Orthopedic Exam: Muscle tone and strength are WNL. No limitations in general ROM. No crepitus or effusions noted. Foot type and digits show no abnormalities. Bony prominences are unremarkable. Skin: No Porokeratosis. No infection or ulcers.  Asymptomatic plantar fibroma left arch.  Diagnosis:  Onychomycosis, , Pain in right toe, pain in left toes  Treatment & Plan Procedures and Treatment: Consent by patient was obtained for treatment procedures. The patient understood the discussion of treatment and procedures well. All questions were answered thoroughly reviewed. Debridement of mycotic and hypertrophic toenails, 1 through 5 bilateral and clearing of subungual debris. No ulceration, no infection noted.  Return Visit-Office Procedure: Patient instructed to return to the office for a follow up visit 10 weeks  for continued evaluation and treatment.    Gardiner Barefoot DPM

## 2017-11-17 ENCOUNTER — Telehealth (HOSPITAL_COMMUNITY): Payer: Self-pay

## 2017-11-17 DIAGNOSIS — I5032 Chronic diastolic (congestive) heart failure: Secondary | ICD-10-CM

## 2017-11-17 NOTE — Telephone Encounter (Signed)
Result Notes for Basic metabolic panel   Notes recorded by Effie Berkshire, RN on 11/17/2017 at 10:56 AM EDT Patient aware and agreeable, lab scheduled after patient returns from vacation (out of town until the 14th) ------  Notes recorded by Kerry Dory, CMA on 11/14/2017 at 3:28 PM EDT Left message for patient to call [phone] (H)  ------  Notes recorded by Georgiana Shore, NP on 11/14/2017 at 11:57 AM EDT Creatinine still slightly elevated. Please call and check on symptoms/weight. If stable, have her come back for BMET in 2 weeks. Thanks.

## 2017-11-24 ENCOUNTER — Ambulatory Visit: Payer: Medicare Other | Admitting: Podiatry

## 2017-11-27 ENCOUNTER — Ambulatory Visit (HOSPITAL_COMMUNITY)
Admission: RE | Admit: 2017-11-27 | Discharge: 2017-11-27 | Disposition: A | Discharge status: Home / Self Care | Payer: Medicare Other | Source: Ambulatory Visit | Attending: Cardiology | Admitting: Cardiology

## 2017-11-27 DIAGNOSIS — I5032 Chronic diastolic (congestive) heart failure: Secondary | ICD-10-CM | POA: Insufficient documentation

## 2017-11-27 LAB — BASIC METABOLIC PANEL
ANION GAP: 9 (ref 5–15)
BUN: 40 mg/dL — ABNORMAL HIGH (ref 6–20)
CALCIUM: 9.6 mg/dL (ref 8.9–10.3)
CO2: 31 mmol/L (ref 22–32)
Chloride: 104 mmol/L (ref 98–111)
Creatinine, Ser: 1.72 mg/dL — ABNORMAL HIGH (ref 0.44–1.00)
GFR, EST AFRICAN AMERICAN: 37 mL/min — AB (ref >60)
GFR, EST NON AFRICAN AMERICAN: 32 mL/min — AB (ref >60)
GLUCOSE: 212 mg/dL — AB (ref 70–99)
POTASSIUM: 3.8 mmol/L (ref 3.5–5.1)
Sodium: 144 mmol/L (ref 135–145)

## 2017-11-29 ENCOUNTER — Ambulatory Visit (INDEPENDENT_AMBULATORY_CARE_PROVIDER_SITE_OTHER): Payer: Medicare Other

## 2017-11-29 ENCOUNTER — Encounter (HOSPITAL_COMMUNITY): Payer: Self-pay | Admitting: Emergency Medicine

## 2017-11-29 ENCOUNTER — Ambulatory Visit (HOSPITAL_COMMUNITY)
Admission: EM | Admit: 2017-11-29 | Discharge: 2017-11-29 | Disposition: A | Discharge status: Home / Self Care | Payer: Medicare Other | Attending: Internal Medicine | Admitting: Internal Medicine

## 2017-11-29 DIAGNOSIS — M79671 Pain in right foot: Secondary | ICD-10-CM

## 2017-11-29 MED ORDER — ACETAMINOPHEN 325 MG PO TABS: 650.0000 mg | ORAL_TABLET | Freq: Four times a day (QID) | ORAL | 0 refills | Status: DC | PRN

## 2017-11-29 MED ORDER — ACETAMINOPHEN 325 MG PO TABS
650.0000 mg | ORAL_TABLET | Freq: Once | ORAL | Status: AC
  Administered 2017-11-29: 650 mg via ORAL

## 2017-11-29 MED ORDER — ACETAMINOPHEN 325 MG PO TABS
ORAL_TABLET | ORAL | Status: AC
  Filled 2017-11-29: qty 2

## 2017-11-29 NOTE — ED Triage Notes (Signed)
Pt here for right foot pain at great toe and heel pain; redness and swelling noted to great toe

## 2017-11-29 NOTE — ED Provider Notes (Addendum)
[facility]    CSN: [id number] Arrival date & time: 11/29/17  1205     History   Chief Complaint Chief Complaint  Patient presents with  . Foot Pain    HPI Amy [name] is a 57 y.o. female.   Amy [name] presents with complaints of right foot pain. States it feels worse after she had to walk up stairs yesterday. Pain to the callus of her right heel, as well as pain to her dorsal foot such as worse with dorsiflexion. Has been following with podiatry for the same pain but worse since yesterday after stairs. No new numbness or tingling. No specific injury or trauma to it. Hx of blindness, asthma, dm, neuropathy, htn, spinal stenosis, gastroparesis. Noted elevation in creatinine in most recent labs.    ROS per HPI.      Past Medical History:  Diagnosis Date  . Arthritis   . Asthma   . Blind   . Blindness and low vision    left eye glass eye,  legally blind in right eye  . Diabetes mellitus   . Diabetic neuropathy ([place])   . Hyperlipidemia   . Hypertension   . Hypothyroidism   . Spinal stenosis     Patient Active Problem List   Diagnosis Date Noted  . Obstructive sleep apnea on CPAP 02/09/2017  . Chronic diastolic heart failure ([place]) 11/08/2016  . Near syncope 01/30/2016  . SOB (shortness of breath)   . CAP (community acquired pneumonia) 09/12/2015  . Hypoxia 09/12/2015  . Hypertension 07/05/2015  . Hypotension 07/05/2015  . Diabetes mellitus with neurological manifestations ([place]) 11/04/2014  . Hyperlipidemia LDL goal <70 11/04/2014  . Spinal stenosis, multilevel 11/04/2014  . Hyperkalemia 11/01/2014  . Hypoglycemia 08/09/2014  . DM (diabetes mellitus), type 2, uncontrolled, with hyperosmolarity ([place]) 08/08/2014  . Elevated troponin 08/08/2014  . Nausea vomiting and diarrhea 08/08/2014  . Dizziness 08/06/2012  . Orthostatic hypotension 08/06/2012  . Hypernatremia 08/06/2012  . Acute gastroenteritis 08/13/2011  . Gastroparesis 08/13/2011  . Chest  pain 08/13/2011  . DM (diabetes mellitus) ([place]) 08/13/2011  . Oral thrush 08/13/2011  . Gastroenteritis 05/23/2011  . Dehydration 05/23/2011  . Blindness 05/23/2011  . DM type 1, not at goal, causing eye disease ([place]) 05/23/2011  . Asthma 05/23/2011  . Diarrhea 05/23/2011  . Vomiting 05/23/2011  . Hypothyroidism 05/23/2011  . Diabetic neuropathy ([place]) 05/23/2011  . Diabetic nephropathy ([place]) 05/23/2011    Past Surgical History:  Procedure Laterality Date  . ABDOMINAL HYSTERECTOMY  1990  . [place]   x 2  . CHOLECYSTECTOMY  2008  . ENUCLEATION Bilateral 09/15/1998  . EYE SURGERY  2016   fitting artificial eye    OB History   None    Obstetric Comments  Pt has two sons.         Home Medications    Prior to Admission medications   Medication Sig Start Date End Date Taking? Authorizing Provider  acetaminophen (TYLENOL) 325 MG tablet Take 2 tablets (650 mg total) by mouth every 6 (six) hours as needed for mild pain or moderate pain. 11/29/17   Zigmund Gottron, NP  albuterol (PROVENTIL HFA;VENTOLIN HFA) 108 (90 BASE) MCG/ACT inhaler Inhale 2 puffs into the lungs every 4 (four) hours as needed for wheezing or shortness of breath. For short    [provider]  aspirin EC 81 MG tablet Take 81 mg by mouth daily.    [provider]  cetirizine Alethia Berthold)  10 MG tablet Take 10 mg by mouth daily.    [provider]  esomeprazole (NEXIUM) 40 MG capsule Take 40 mg by mouth daily at 12 noon.     [provider]  fluorometholone (FML FORTE) 0.25 % ophthalmic suspension Place 1 drop into the right eye 2 (two) times daily.    [provider]  fluticasone (FLONASE) 50 MCG/ACT nasal spray Place into both nostrils daily.    [provider]  Cleda Clarks 100 UNIT/ML [facility]  01/24/17   [provider]  insulin degludec (TRESIBA FLEXTOUCH) 100 UNIT/ML SOPN FlexTouch Pen Inject 60 Units into the skin daily at 10  pm.    [provider]  levothyroxine (SYNTHROID, LEVOTHROID) 150 MCG tablet Take 150 mcg by mouth daily before breakfast.    [provider]  Linaclotide (LINZESS) 145 MCG CAPS capsule Take 145 mcg by mouth daily as needed (constipation).     [provider]  LYRICA 150 MG capsule Take 150 mg by mouth 3 (three) times daily. 10/28/14   [provider]  metolazone (ZAROXOLYN) 5 MG tablet TAKE 1 TABLET BY MOUTH ONCE DAILY AS NEEDED. TAKE FOR WEIGHT GAIN OF 3 LBS OVERNIGHT OR 5LBS IN A WEEK 11/15/17   Bensimhon, Shaune Pascal, MD  ondansetron (ZOFRAN) 4 MG tablet 4 mg. As needed 02/06/17   [provider]  potassium chloride SA (K-DUR,KLOR-CON) 20 MEQ tablet Take 2 tablets (40 mEq total) by mouth daily. 11/06/17   Georgiana Shore, NP  spironolactone (ALDACTONE) 25 MG tablet Take 0.5 tablets (12.5 mg total) by mouth daily. 10/30/17   Shirley Friar, PA-C  torsemide (DEMADEX) 20 MG tablet Take 2 tablets (40 mg total) by mouth daily. 10/24/17   Georgiana Shore, NP  triamcinolone cream (KENALOG) 0.1 % Apply 1 application topically 2 (two) times daily.    [provider]  insulin glargine (LANTUS) 100 UNIT/ML injection Inject 0.4 mLs (40 Units total) into the skin at bedtime. 12/18/12 01/02/13  Reyne Dumas, MD    Family History Family History  Problem Relation Age of Onset  . Diabetes Sister   . Glaucoma Sister   . Hypertension Sister   . Stroke Brother   . Heart attack Paternal Aunt     Social History Social History   Tobacco Use  . Smoking status: Former Smoker    Types: Cigarettes    Last attempt to quit: 05/22/1992    Years since quitting: 25.5  . Smokeless tobacco: Never Used  Substance Use Topics  . Alcohol use: No    Comment: quit 20 yrs ago  . Drug use: No     Allergies   Penicillins; Iohexol; Codeine; Iodine-131; Lisinopril; and Sulfa antibiotics   Review of Systems Review of Systems   Physical Exam Triage Vital  Signs ED Triage Vitals  Enc Vitals Group     BP 11/29/17 1237 98/61     Pulse Rate 11/29/17 1237 79     Resp 11/29/17 1237 18     Temp 11/29/17 1237 98.1 F (36.7 C)     Temp Source 11/29/17 1237 Oral     SpO2 11/29/17 1237 98 %     Weight --      Height --      Head Circumference --      Peak Flow --      Pain Score 11/29/17 1259 8     Pain Loc --      Pain Edu? --  Excl. in GC? --    No data found.  Updated Vital Signs BP 98/61 (BP Location: Left Arm)   Pulse 79   Temp 98.1 F (36.7 C) (Oral)   Resp 18   SpO2 98%    Physical Exam  Constitutional: She is oriented to person, place, and time. She appears well-developed and well-nourished. No distress.  Cardiovascular: Normal rate, regular rhythm and normal heart sounds.  Pulmonary/Chest: Effort normal and breath sounds normal.  Musculoskeletal:       Right ankle: Normal.       Right foot: There is tenderness and bony tenderness. There is normal range of motion, no swelling, normal capillary refill, no crepitus, no deformity and no laceration.  Generalized dorsal foot with tenderness on palpation; pain to dorsal foot with dorsiflexion of toes and ankle; tenderness to callus noted to heal, without redness or swelling; no open skin or lesions; no swelling or redness; strong pedal pulse; cap refill < 2 seconds    Neurological: She is alert and oriented to person, place, and time.  Skin: Skin is warm and dry.     UC Treatments / Results  Labs (all labs ordered are listed, but only abnormal results are displayed) Labs Reviewed - No data to display  EKG None  Radiology Dg Foot Complete Right  Result Date: 11/29/2017 CLINICAL DATA:  RIGHT foot pain at great toe, heel pain, redness and swelling great toe, no known injury EXAM: RIGHT FOOT COMPLETE - 3+ VIEW COMPARISON:  None FINDINGS: Osseous mineralization normal. Joint spaces preserved. No acute fracture, dislocation, or bone destruction. Scattered soft tissue  swelling at dorsum of RIGHT foot. No erosive or inflammatory osseous changes. IMPRESSION: No acute osseous abnormalities. Electronically Signed   By: Lavonia Dana M.D.   On: 11/29/2017 13:29    Procedures Procedures (including critical care time)  Medications Ordered in UC Medications  acetaminophen (TYLENOL) tablet 650 mg (has no administration in time range)    Initial Impression / Assessment and Plan / UC Course  I have reviewed the triage vital signs and the nursing notes.  Pertinent labs & imaging results that were available during my care of the patient were reviewed by me and considered in my medical decision making (see chart for details).     Question some muscular strain from stairs yesterday. Xray WNL. Continue to follow with podiatry for callus and foot care management. ACE wrap applied. Tylenol prn. Encouraged to monitor and/or have home care aid monitor feet to ensure no breakdown. Patient verbalized understanding and agreeable to plan.   Final Clinical Impressions(s) / UC Diagnoses   Final diagnoses:  Foot pain, right     Discharge Instructions     Ace Wrap as needed for compression and support. Ice, elevation for pain, tylenol as needed for pain control. Please monitor the foot for skin breakdown.  Please follow up with podiatry in the next 1-2 weeks for recheck.    ED Prescriptions    Medication Sig Dispense Auth. Provider   acetaminophen (TYLENOL) 325 MG tablet Take 2 tablets (650 mg total) by mouth every 6 (six) hours as needed for mild pain or moderate pain. 60 tablet Zigmund Gottron, NP     Controlled Substance Prescriptions [place] Controlled Substance Registry consulted? Not Applicable       Zigmund Gottron, NP 11/29/17 1350

## 2017-11-29 NOTE — Discharge Instructions (Signed)
Ace Wrap as needed for compression and support. Ice, elevation for pain, tylenol as needed for pain control. Please monitor the foot for skin breakdown.  Please follow up with podiatry in the next 1-2 weeks for recheck.

## 2017-12-05 ENCOUNTER — Telehealth: Payer: Self-pay | Admitting: Podiatry

## 2017-12-07 ENCOUNTER — Emergency Department (HOSPITAL_COMMUNITY)
Admission: EM | Admit: 2017-12-07 | Discharge: 2017-12-07 | Disposition: A | Discharge status: Home / Self Care | Payer: Medicare Other | Attending: Emergency Medicine | Admitting: Emergency Medicine

## 2017-12-07 ENCOUNTER — Emergency Department (HOSPITAL_COMMUNITY): Payer: Medicare Other

## 2017-12-07 DIAGNOSIS — Z87891 Personal history of nicotine dependence: Secondary | ICD-10-CM | POA: Insufficient documentation

## 2017-12-07 DIAGNOSIS — I10 Essential (primary) hypertension: Secondary | ICD-10-CM | POA: Insufficient documentation

## 2017-12-07 DIAGNOSIS — E119 Type 2 diabetes mellitus without complications: Secondary | ICD-10-CM | POA: Insufficient documentation

## 2017-12-07 DIAGNOSIS — W19XXXA Unspecified fall, initial encounter: Secondary | ICD-10-CM

## 2017-12-07 DIAGNOSIS — E039 Hypothyroidism, unspecified: Secondary | ICD-10-CM | POA: Insufficient documentation

## 2017-12-07 DIAGNOSIS — Z79899 Other long term (current) drug therapy: Secondary | ICD-10-CM | POA: Diagnosis not present

## 2017-12-07 DIAGNOSIS — M25571 Pain in right ankle and joints of right foot: Secondary | ICD-10-CM | POA: Diagnosis not present

## 2017-12-07 DIAGNOSIS — M542 Cervicalgia: Secondary | ICD-10-CM | POA: Diagnosis not present

## 2017-12-07 LAB — CBC WITH DIFFERENTIAL/PLATELET
ABS IMMATURE GRANULOCYTES: 0.1 10*3/uL (ref 0.0–0.1)
BASOS ABS: 0.1 10*3/uL (ref 0.0–0.1)
Basophils Relative: 0 %
Eosinophils Absolute: 0.1 10*3/uL (ref 0.0–0.7)
Eosinophils Relative: 1 %
HEMATOCRIT: 40.3 % (ref 36.0–46.0)
HEMOGLOBIN: 12.7 g/dL (ref 12.0–15.0)
IMMATURE GRANULOCYTES: 1 %
LYMPHS PCT: 13 %
Lymphs Abs: 1.8 10*3/uL (ref 0.7–4.0)
MCH: 25.6 pg — ABNORMAL LOW (ref 26.0–34.0)
MCHC: 31.5 g/dL (ref 30.0–36.0)
MCV: 81.1 fL (ref 78.0–100.0)
MONO ABS: 1.5 10*3/uL — AB (ref 0.1–1.0)
Monocytes Relative: 11 %
NEUTROS ABS: 10.2 10*3/uL — AB (ref 1.7–7.7)
NEUTROS PCT: 74 %
PLATELETS: 195 10*3/uL (ref 150–400)
RBC: 4.97 MIL/uL (ref 3.87–5.11)
RDW: 15.8 % — ABNORMAL HIGH (ref 11.5–15.5)
WBC: 13.7 10*3/uL — ABNORMAL HIGH (ref 4.0–10.5)

## 2017-12-07 LAB — BASIC METABOLIC PANEL
ANION GAP: 11 (ref 5–15)
BUN: 32 mg/dL — ABNORMAL HIGH (ref 6–20)
CHLORIDE: 104 mmol/L (ref 98–111)
CO2: 31 mmol/L (ref 22–32)
Calcium: 9.8 mg/dL (ref 8.9–10.3)
Creatinine, Ser: 1.43 mg/dL — ABNORMAL HIGH (ref 0.44–1.00)
GFR calc Af Amer: 46 mL/min — ABNORMAL LOW (ref >60)
GFR, EST NON AFRICAN AMERICAN: 40 mL/min — AB (ref >60)
Glucose, Bld: 82 mg/dL (ref 70–99)
POTASSIUM: 3.4 mmol/L — AB (ref 3.5–5.1)
Sodium: 146 mmol/L — ABNORMAL HIGH (ref 135–145)

## 2017-12-07 LAB — I-STAT BETA HCG BLOOD, ED (MC, WL, AP ONLY): I-stat hCG, quantitative: <5 m[IU]/mL (ref <5)

## 2017-12-07 LAB — CBG MONITORING, ED
GLUCOSE-CAPILLARY: 43 mg/dL — AB (ref 70–99)
GLUCOSE-CAPILLARY: 86 mg/dL (ref 70–99)

## 2017-12-07 MED ORDER — ACETAMINOPHEN 325 MG PO TABS
650.0000 mg | ORAL_TABLET | Freq: Once | ORAL | Status: AC
  Administered 2017-12-07: 650 mg via ORAL
  Filled 2017-12-07: qty 2

## 2017-12-07 NOTE — ED Triage Notes (Signed)
Pt arrived from home where she fell while walking with her medical assistant. Pt states she fell backaward after losing her balance, striking the back of her head. Unknown if pt is taking blood thinners. Pt is alert and oriented x4 at time of triage. Pt CBG 50 on scene, 2x oral glucose given by EMS but sbg remained at 50. Triage cbg was 43. EDP notified. Line to be placed by ED staff.

## 2017-12-07 NOTE — ED Notes (Signed)
Pt called her sister for ride home. Sister is en route but unsure of arrival time.

## 2017-12-07 NOTE — ED Notes (Signed)
Pt taken to lobby via wheelchair by EMT. Pt had no concerns expressed prior to d/c. Paperwork given to Pt's sister who was providing pt with ride home.

## 2017-12-07 NOTE — ED Notes (Signed)
Pt eating crackers at her request.

## 2017-12-07 NOTE — ED Provider Notes (Addendum)
[facility] EMERGENCY DEPARTMENT Provider Note  CSN: [id number] Arrival date & time: 12/07/17  1420  History   Chief Complaint Chief Complaint  Patient presents with  . Fall   HPI Amy [name] is a 57 y.o. female with a medical history of bilateral blindness, Type 2 DM, HTN, hypothyroidism and asthma who presented to the ED following a fall. Patient states she was sitting on her rolling walker when her aide accidentally pulled her chair in the wrong direction causing the patient to fall backward landing on her head. Currently endorses right ankle and neck pain. Denies LOC, AMS/confusion, headache, dizziness, lightheadedness, slurred speech, paresthesias, weakness.  Past Medical History:  Diagnosis Date  . Arthritis   . Asthma   . Blind   . Blindness and low vision    left eye glass eye,  legally blind in right eye  . Diabetes mellitus   . Diabetic neuropathy ([place])   . Hyperlipidemia   . Hypertension   . Hypothyroidism   . Spinal stenosis     Patient Active Problem List   Diagnosis Date Noted  . Obstructive sleep apnea on CPAP 02/09/2017  . Chronic diastolic heart failure ([place]) 11/08/2016  . Near syncope 01/30/2016  . SOB (shortness of breath)   . CAP (community acquired pneumonia) 09/12/2015  . Hypoxia 09/12/2015  . Hypertension 07/05/2015  . Hypotension 07/05/2015  . Diabetes mellitus with neurological manifestations ([place]) 11/04/2014  . Hyperlipidemia LDL goal <70 11/04/2014  . Spinal stenosis, multilevel 11/04/2014  . Hyperkalemia 11/01/2014  . Hypoglycemia 08/09/2014  . DM (diabetes mellitus), type 2, uncontrolled, with hyperosmolarity ([place]) 08/08/2014  . Elevated troponin 08/08/2014  . Nausea vomiting and diarrhea 08/08/2014  . Dizziness 08/06/2012  . Orthostatic hypotension 08/06/2012  . Hypernatremia 08/06/2012  . Acute gastroenteritis 08/13/2011  . Gastroparesis 08/13/2011  . Chest pain 08/13/2011  . DM (diabetes mellitus) ([place])  08/13/2011  . Oral thrush 08/13/2011  . Gastroenteritis 05/23/2011  . Dehydration 05/23/2011  . Blindness 05/23/2011  . DM type 1, not at goal, causing eye disease ([place]) 05/23/2011  . Asthma 05/23/2011  . Diarrhea 05/23/2011  . Vomiting 05/23/2011  . Hypothyroidism 05/23/2011  . Diabetic neuropathy ([place]) 05/23/2011  . Diabetic nephropathy ([place]) 05/23/2011    Past Surgical History:  Procedure Laterality Date  . ABDOMINAL HYSTERECTOMY  1990  . [place]   x 2  . CHOLECYSTECTOMY  2008  . ENUCLEATION Bilateral 09/15/1998  . EYE SURGERY  2016   fitting artificial eye     OB History   None    Obstetric Comments  Pt has two sons.         Home Medications    Prior to Admission medications   Medication Sig Start Date End Date Taking? Authorizing Provider  acetaminophen (TYLENOL) 325 MG tablet Take 2 tablets (650 mg total) by mouth every 6 (six) hours as needed for mild pain or moderate pain. 11/29/17   Zigmund Gottron, NP  albuterol (PROVENTIL HFA;VENTOLIN HFA) 108 (90 BASE) MCG/ACT inhaler Inhale 2 puffs into the lungs every 4 (four) hours as needed for wheezing or shortness of breath. For short    [provider]  aspirin EC 81 MG tablet Take 81 mg by mouth daily.    [provider]  cetirizine (ZYRTEC) 10 MG tablet Take 10 mg by mouth daily.    [provider]  esomeprazole (NEXIUM) 40 MG capsule Take 40 mg by mouth daily at 12 noon.  [provider]  fluorometholone (FML FORTE) 0.25 % ophthalmic suspension Place 1 drop into the right eye 2 (two) times daily.    [provider]  fluticasone (FLONASE) 50 MCG/ACT nasal spray Place into both nostrils daily.    [provider]  Cleda Clarks 100 UNIT/ML [facility]  01/24/17   [provider]  insulin degludec (TRESIBA FLEXTOUCH) 100 UNIT/ML SOPN FlexTouch Pen Inject 60 Units into the skin daily at 10 pm.    [provider]  levothyroxine  (SYNTHROID, LEVOTHROID) 150 MCG tablet Take 150 mcg by mouth daily before breakfast.    [provider]  Linaclotide (LINZESS) 145 MCG CAPS capsule Take 145 mcg by mouth daily as needed (constipation).     [provider]  LYRICA 150 MG capsule Take 150 mg by mouth 3 (three) times daily. 10/28/14   [provider]  metolazone (ZAROXOLYN) 5 MG tablet TAKE 1 TABLET BY MOUTH ONCE DAILY AS NEEDED. TAKE FOR WEIGHT GAIN OF 3 LBS OVERNIGHT OR 5LBS IN A WEEK 11/15/17   Bensimhon, Shaune Pascal, MD  ondansetron (ZOFRAN) 4 MG tablet 4 mg. As needed 02/06/17   [provider]  potassium chloride SA (K-DUR,KLOR-CON) 20 MEQ tablet Take 2 tablets (40 mEq total) by mouth daily. 11/06/17   Georgiana Shore, NP  spironolactone (ALDACTONE) 25 MG tablet Take 0.5 tablets (12.5 mg total) by mouth daily. 10/30/17   Shirley Friar, PA-C  torsemide (DEMADEX) 20 MG tablet Take 2 tablets (40 mg total) by mouth daily. 10/24/17   Georgiana Shore, NP  triamcinolone cream (KENALOG) 0.1 % Apply 1 application topically 2 (two) times daily.    [provider]  insulin glargine (LANTUS) 100 UNIT/ML injection Inject 0.4 mLs (40 Units total) into the skin at bedtime. 12/18/12 01/02/13  Reyne Dumas, MD    Family History Family History  Problem Relation Age of Onset  . Diabetes Sister   . Glaucoma Sister   . Hypertension Sister   . Stroke Brother   . Heart attack Paternal Aunt     Social History Social History   Tobacco Use  . Smoking status: Former Smoker    Types: Cigarettes    Last attempt to quit: 05/22/1992    Years since quitting: 25.5  . Smokeless tobacco: Never Used  Substance Use Topics  . Alcohol use: No    Comment: quit 20 yrs ago  . Drug use: No     Allergies   Penicillins; Iohexol; Codeine; Iodine-131; Lisinopril; and Sulfa antibiotics   Review of Systems Review of Systems  Constitutional: Negative.   Eyes:       Bilateral blindness. Left eye prosthetic.    Musculoskeletal: Positive for arthralgias, joint swelling and neck pain. Negative for neck stiffness.  Skin: Negative for wound.  Neurological: Negative for dizziness, speech difficulty, weakness, light-headedness, numbness and headaches.  Hematological: Does not bruise/bleed easily.  Psychiatric/Behavioral: Negative for confusion and decreased concentration.     Physical Exam Updated Vital Signs BP 122/70   Pulse (!) 111   Temp 99.4 F (37.4 C) (Oral)   Resp 15   SpO2 (!) 88%   Physical Exam  Constitutional: Vital signs are normal. She is cooperative.  Has towel around her neck applied by EMS  HENT:  Head: Normocephalic and atraumatic.  Mouth/Throat: Uvula is midline, oropharynx is clear and moist and mucous membranes are normal.  Eyes: Conjunctivae, EOM and lids are normal.  Left eye prosthetic. Right pupil appropriately reacts to light  and has normal EOM.  Neck: Neck supple. Spinous process tenderness and muscular tenderness present. No neck rigidity. Decreased range of motion present.  Difficult to palpate midline of c-spine due to pt's body habitus and presence of towel.  Cardiovascular: Normal rate, regular rhythm, normal heart sounds and intact distal pulses.  No murmur heard. Pulses:      Dorsalis pedis pulses are 2+ on the right side, and 2+ on the left side.       Posterior tibial pulses are 2+ on the right side, and 2+ on the left side.  Pulmonary/Chest: Effort normal and breath sounds normal.  Musculoskeletal:       Right knee: Normal.       Left knee: Normal.       Right ankle: She exhibits decreased range of motion and swelling. Tenderness. Lateral malleolus tenderness found. Achilles tendon normal.       Left ankle: Normal.       Cervical back: She exhibits decreased range of motion and tenderness.       Thoracic back: Normal.       Lumbar back: Normal.  Difficult to appreciate bony vs muscular tenderness of c-spine due to patient's positioning.  Lateral  malleolus of right ankle tender to palpation. Mild swelling visualized when compared to the left. 4/5 strength in right ankle.    Neurological: She is alert. No sensory deficit. She exhibits normal muscle tone.  Skin: Skin is warm and intact. Capillary refill takes 2 to 3 seconds. No abrasion, no bruising and no ecchymosis noted.  Nursing note and vitals reviewed.    ED Treatments / Results  Labs (all labs ordered are listed, but only abnormal results are displayed) Labs Reviewed  CBC WITH DIFFERENTIAL/PLATELET - Abnormal; Notable for the following components:      Result Value   WBC 13.7 (*)    MCH 25.6 (*)    RDW 15.8 (*)    Neutro Abs 10.2 (*)    Monocytes Absolute 1.5 (*)    All other components within normal limits  BASIC METABOLIC PANEL - Abnormal; Notable for the following components:   Sodium 146 (*)    Potassium 3.4 (*)    BUN 32 (*)    Creatinine, Ser 1.43 (*)    GFR calc non Af Amer 40 (*)    GFR calc Af Amer 46 (*)    All other components within normal limits  CBG MONITORING, ED - Abnormal; Notable for the following components:   Glucose-Capillary 43 (*)    All other components within normal limits  I-STAT BETA HCG BLOOD, ED ([facility], [facility], [facility] ONLY)  CBG MONITORING, ED    EKG EKG Interpretation  Date/Time:  Thursday December 07 2017 14:43:55 EDT Ventricular Rate:  68 PR Interval:    QRS Duration: 87 QT Interval:  429 QTC Calculation: 457 R Axis:   53 Text Interpretation:  Sinus rhythm No significant change since last tracing Confirmed by Duffy Bruce [phone]) on 12/07/2017 2:53:58 PM   Radiology Dg Cervical Spine Complete  Result Date: 12/07/2017 CLINICAL DATA:  Pt arrived from home where she fell while walking with her medical assistant. Pt states she fell backward after losing her balance, striking the back of her head. Pt c/o posterior neck pain as well as right ankle pain and swelling. Swelling and pain to the dorsal side of right ankle/ foot and around toes.  Pt is a type 2 diabetic. Unknown if pt is taking blood thinners EXAM: CERVICAL SPINE -  COMPLETE 4+ VIEW COMPARISON:  12/19/2015 FINDINGS: No fracture.  No spondylolisthesis. Mild to moderate loss disc height throughout the cervical spine. Anterior bridging osteophytes noted from C3 through C6. Soft tissues are unremarkable. IMPRESSION: 1. No fracture, spondylolisthesis or acute finding Electronically Signed   By: Lajean Manes M.D.   On: 12/07/2017 15:43   Dg Ankle Complete Right  Result Date: 12/07/2017 CLINICAL DATA:  Pt arrived from home where she fell while walking with her medical assistant. Pt states she fell backward after losing her balance, striking the back of her head. Pt c/o posterior neck pain as well as right ankle pain and swelling. Swelling and pain to the dorsal side of right ankle/ foot and around toes. Pt is a type 2 diabetic. Unknown if pt is taking blood thinners EXAM: RIGHT ANKLE - COMPLETE 3+ VIEW COMPARISON:  11/29/2017 FINDINGS: No fracture.  No bone lesion. Ankle mortise is normally spaced and aligned. There is diffuse nonspecific soft tissue edema. IMPRESSION: No fracture or dislocation. Electronically Signed   By: Lajean Manes M.D.   On: 12/07/2017 15:44    Procedures Procedures (including critical care time)  Medications Ordered in ED Medications  acetaminophen (TYLENOL) tablet 650 mg (650 mg Oral Given 12/07/17 1637)     Initial Impression / Assessment and Plan / ED Course  Triage vital signs and the nursing notes have been reviewed.  Pertinent labs & imaging results that were available during care of the patient were reviewed and considered in medical decision making (see chart for details).  Patient presents following a mechanical fall from her rolling walker. While she endorsed hitting her head, patient denies LOC and AMS. She also denies neuro symptoms of dizziness, headache, paresthesias, weakness or other deficits that would suggest an acute intracranial  process. Patient is not on anticoagulant which is also reassuring. No indication for head imaging today. Neuro exam is normal. However, patient has neck pain and is difficult to delineate midline bony vs muscular tenderness due to body habitus.  Clinical Course as of Dec 07 1704  Thu Dec 07, 2017  1453 EKG showed NSR. No ST elevations/depressions or signs of acute ischemia or infarct.   Creatinine elevated at 1.43, but this is improved from pt's past creatinine readings of > 1.70 this year. No s/s of acute renal failure that warrant additional intervention today.   [GM]  2671 Per EMS, pt's glucose at home was 50 and 43 in triage. It is unclear if patient endorsed any hypoglycemic symptoms at that time. Patient was not noted to be altered or complain of dizziness/lightheadedness, paresthesias, jitteriness.  During the interview, patient states she had her AM dose of insulin and breakfast around 8am. She states her normal blood glucoses are in the 100s. She states that she has not had anything to eat or drink since then. This is why she was hypoglycemic upon arrival. Patient's glucose was WNL. This provider asked the nurses to feed the patient. Will recheck a CBG.   [GM]  1543 X-rays resulted. No acute fractures or dislocations seen in c-spine or right ankle.   [GM]    Clinical Course User Index [GM] Romie Jumper, [place]   Imaging results are reassuring. No fractures or dislocations visualized. Patient is no longer hypoglycemic and has had 2 consecutive glucose readings WNL and has received food and drink in the ED.  Final Clinical Impressions(s) / ED Diagnoses  1. Fall. Education provided on OTC and supportive treatment for muscle soreness and inflammation.  Dispo: Home. After thorough clinical evaluation, this patient is determined to be medically stable and can be safely discharged with the previously mentioned treatment and/or outpatient follow-up/referral(s). At this time, there are no  other apparent medical conditions that require further screening, evaluation or treatment.   Final diagnoses:  Fall, initial encounter  Acute neck pain  Acute right ankle pain    ED Discharge Orders    None        Romie Jumper, PA-C 12/07/17 1704    [address] [place], PA-C 12/07/17 1706    Duffy Bruce, MD 12/07/17 2248

## 2017-12-07 NOTE — ED Notes (Signed)
Rainbow obtained and sent to lab

## 2017-12-07 NOTE — Discharge Instructions (Addendum)
Your x-rays looked good today! There were no fractures in your neck or ankle.  You may feel more sore tomorrow. You may use Tylenol for pain relief and inflammation. Warm and cool compresses may provide additional relief.  Follow-up with your PCP as you normally would.

## 2017-12-10 IMAGING — MR MR LUMBAR SPINE W/O CM
4 of 5 series · 27 of 48 positions shown · non-contrast
Comparison: CT abdomen and pelvis 09/11/2015

CLINICAL DATA: Bilateral sciatica. Low back pain for 1 year with
pain radiating down both legs. Bilateral leg weakness.

EXAM:
MRI LUMBAR SPINE WITHOUT CONTRAST
TECHNIQUE: Multiplanar, multisequence MR imaging of the lumbar spine was
performed. No intravenous contrast was administered.

[Series 4: T1 · sagittal · 4.0mm · 0.55mm/px · 5 of 12 slices shown (1 of 2)]
[im 1/12]
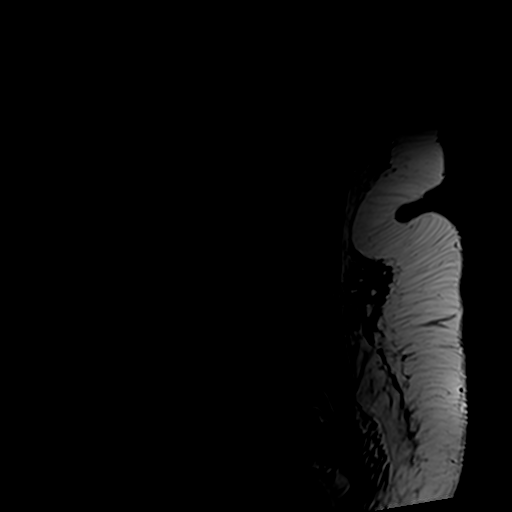
[im 3/12]
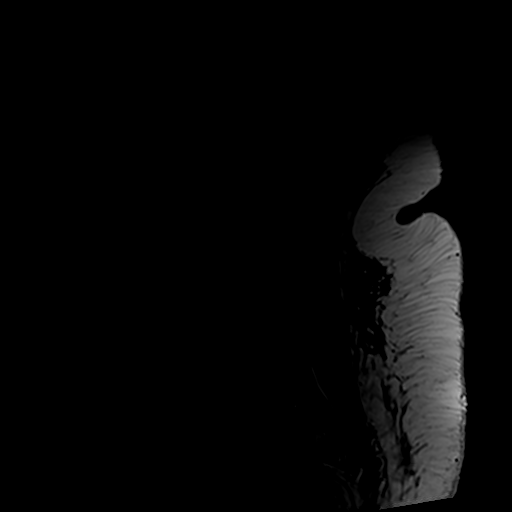
[im 6/12]
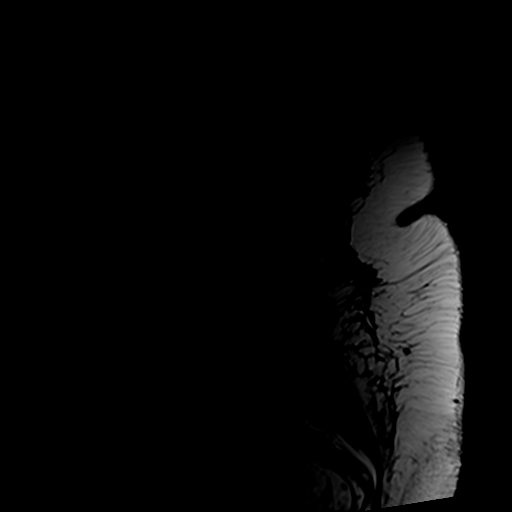
[im 9/12]
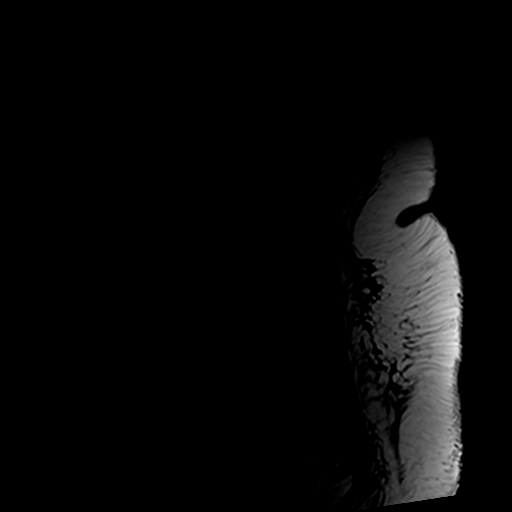
[im 12/12]
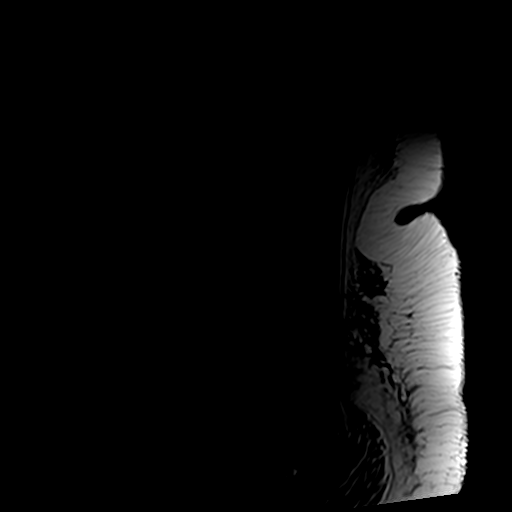

[Series 6: T2 · axial · 4.0mm · 0.70mm/px · z∈[+21,+180]mm · 10 of 33 slices shown (1 of 2)]
[im 3/33]
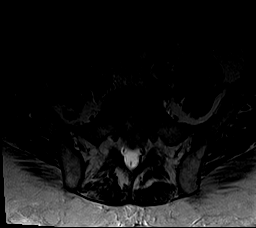
[im 5/33]
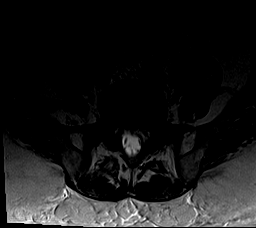
[im 7/33]
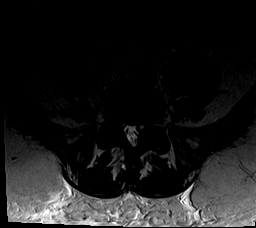
[im 11/33]
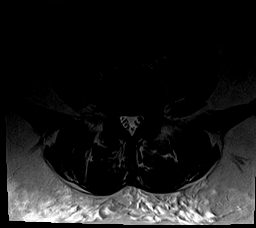
[im 15/33]
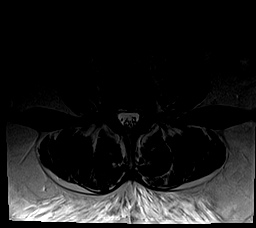
[im 18/33]
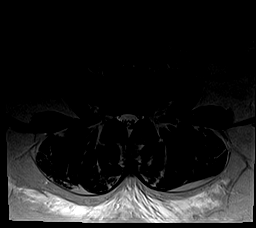
[im 20/33]
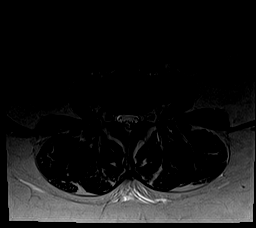
[im 24/33]
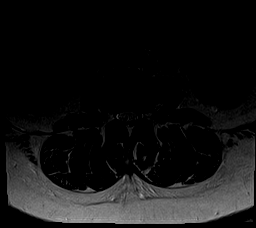
[im 28/33]
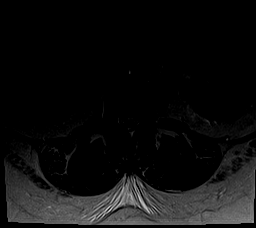
[im 33/33]
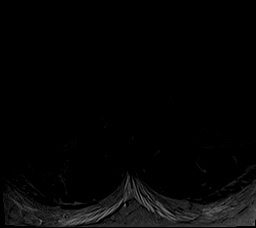

[Series 7: T1 · axial · 4.0mm · 0.35mm/px · z∈[+21,+154]mm · 6 of 33 slices shown (2 of 2)]
[im 3/33]
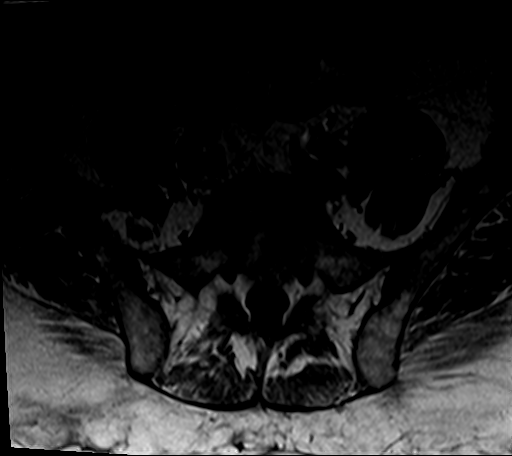
[im 5/33]
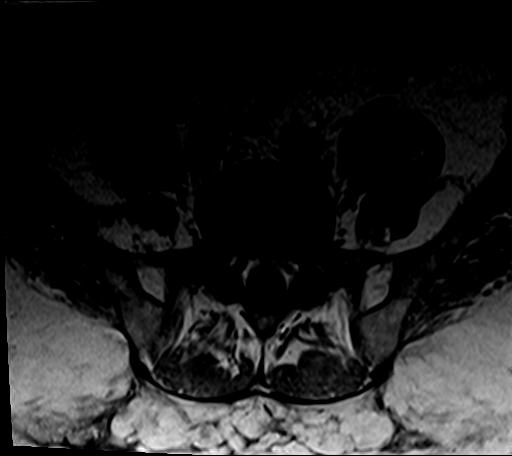
[im 7/33]
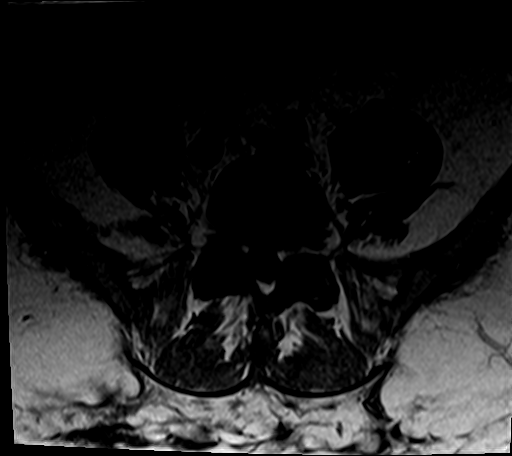
[im 11/33]
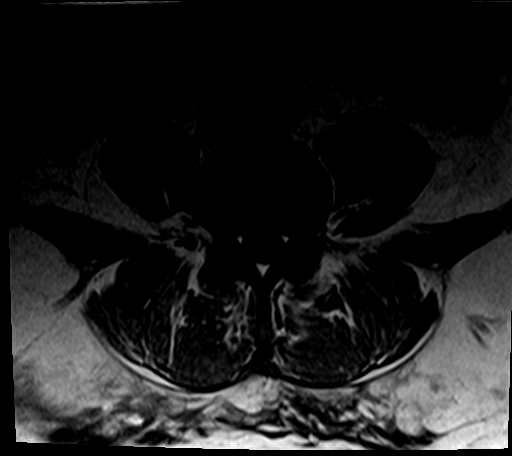
[im 18/33]
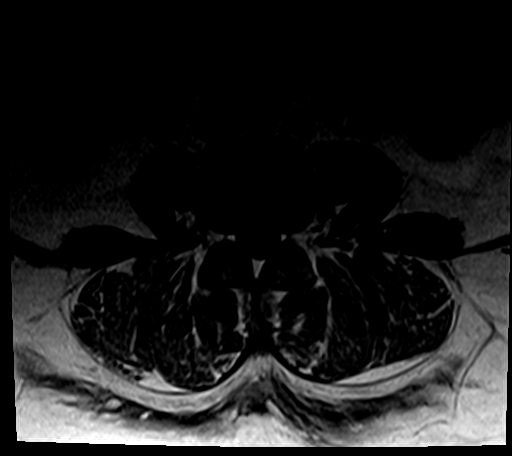
[im 28/33]
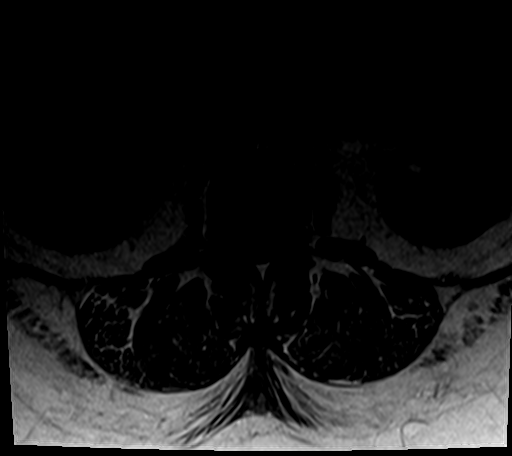

[Series 8: T2 · sagittal · 4.0mm · 0.55mm/px · 6 of 12 slices shown (2 of 2)]
[im 1/12]
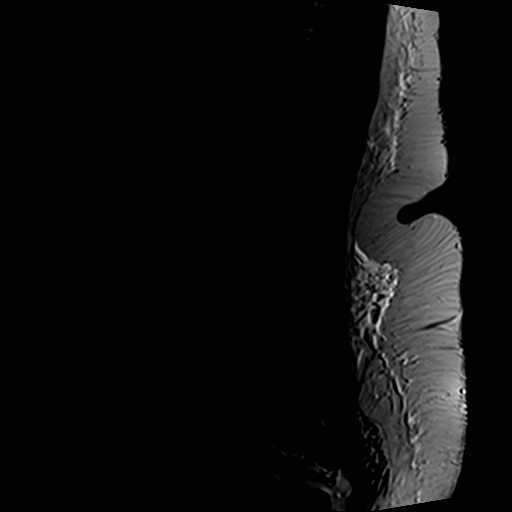
[im 3/12]
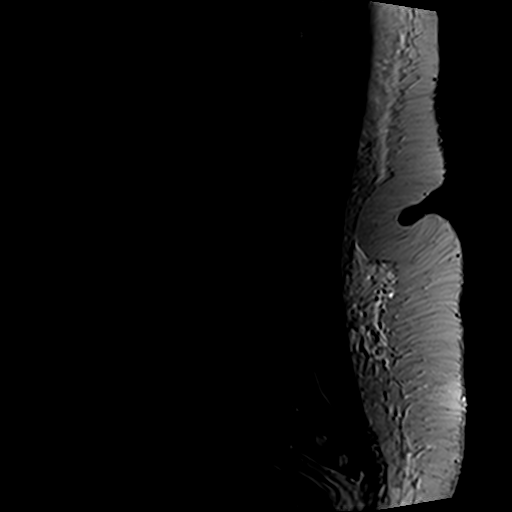
[im 5/12]
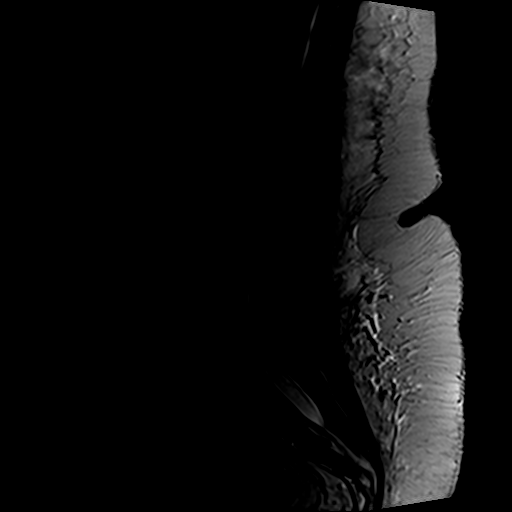
[im 7/12]
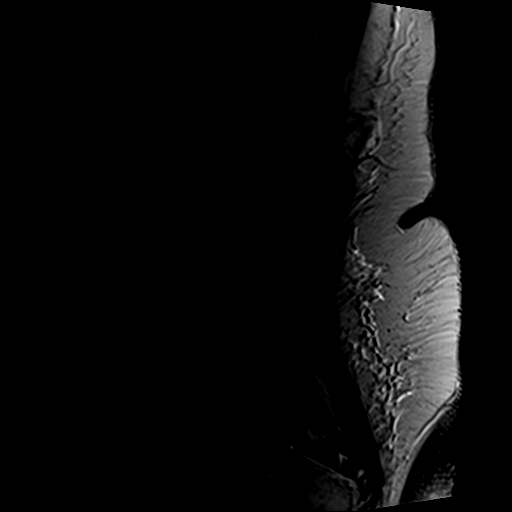
[im 9/12]
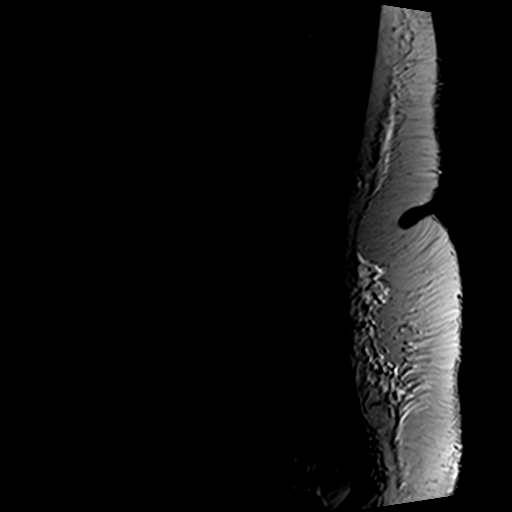
[im 12/12]
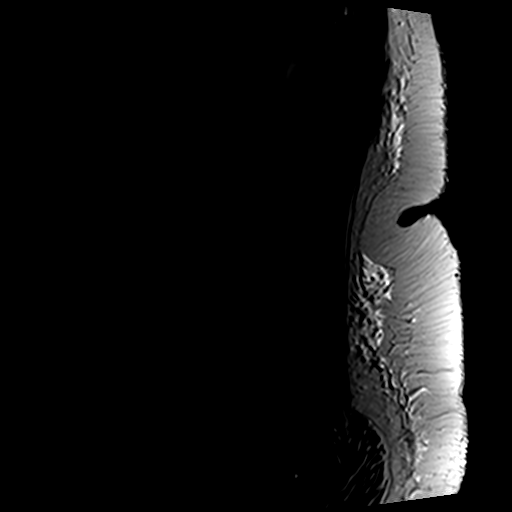

[27 of 48 positions shown; findings below may reference images not displayed]

FINDINGS: Segmentation:  Standard.

Alignment:  Physiologic.

Vertebrae: Preserved vertebral body heights without evidence of
fracture. Somewhat heterogeneous bone marrow signal diffusely. No
suspicious focal osseous lesion or significant vertebral marrow
edema identified.

Conus medullaris: Extends to the L2 level and appears normal.

Paraspinal and other soft tissues: Unremarkable aside from
subcutaneous edema in the low back.

Disc levels:

T11-12: Only imaged sagittally. Minimal disc bulging and right facet
arthrosis without evidence of compressive stenosis.

T12-L1: Only imaged sagittally. Mild right facet arthrosis without
evidence of compressive stenosis.

L1-2 through L3-4:  No disc herniation or stenosis.

L4-5: Minimal disc bulging and mild facet hypertrophy without
significant stenosis.

L5-S1: Moderate facet arthrosis and minimal disc bulging result in
mild bilateral neural foraminal narrowing without spinal stenosis.
IMPRESSION: 1. Moderate L5-S1 facet arthrosis with mild bilateral neural
foraminal narrowing.
2. Mild spondylosis and facet arthrosis elsewhere as above without
evidence of impingement.

## 2017-12-11 ENCOUNTER — Encounter (HOSPITAL_COMMUNITY): Payer: Self-pay

## 2017-12-11 ENCOUNTER — Emergency Department (HOSPITAL_COMMUNITY): Payer: Medicare Other

## 2017-12-11 ENCOUNTER — Observation Stay (HOSPITAL_COMMUNITY)
Admission: EM | Admit: 2017-12-11 | Discharge: 2017-12-14 | Disposition: A | Discharge status: Home / Self Care | Payer: Medicare Other | Attending: Internal Medicine | Admitting: Internal Medicine

## 2017-12-11 ENCOUNTER — Telehealth: Payer: Self-pay | Admitting: Podiatry

## 2017-12-11 ENCOUNTER — Other Ambulatory Visit: Payer: Self-pay

## 2017-12-11 DIAGNOSIS — M4800 Spinal stenosis, site unspecified: Secondary | ICD-10-CM | POA: Diagnosis present

## 2017-12-11 DIAGNOSIS — M4802 Spinal stenosis, cervical region: Secondary | ICD-10-CM | POA: Diagnosis not present

## 2017-12-11 DIAGNOSIS — Z7951 Long term (current) use of inhaled steroids: Secondary | ICD-10-CM | POA: Diagnosis not present

## 2017-12-11 DIAGNOSIS — J45909 Unspecified asthma, uncomplicated: Secondary | ICD-10-CM | POA: Diagnosis not present

## 2017-12-11 DIAGNOSIS — H547 Unspecified visual loss: Secondary | ICD-10-CM

## 2017-12-11 DIAGNOSIS — Z970 Presence of artificial eye: Secondary | ICD-10-CM | POA: Diagnosis not present

## 2017-12-11 DIAGNOSIS — E039 Hypothyroidism, unspecified: Secondary | ICD-10-CM | POA: Diagnosis present

## 2017-12-11 DIAGNOSIS — M79604 Pain in right leg: Secondary | ICD-10-CM | POA: Insufficient documentation

## 2017-12-11 DIAGNOSIS — Z794 Long term (current) use of insulin: Secondary | ICD-10-CM | POA: Diagnosis not present

## 2017-12-11 DIAGNOSIS — N189 Chronic kidney disease, unspecified: Secondary | ICD-10-CM | POA: Diagnosis not present

## 2017-12-11 DIAGNOSIS — E785 Hyperlipidemia, unspecified: Secondary | ICD-10-CM | POA: Diagnosis not present

## 2017-12-11 DIAGNOSIS — G4733 Obstructive sleep apnea (adult) (pediatric): Secondary | ICD-10-CM | POA: Diagnosis not present

## 2017-12-11 DIAGNOSIS — E1065 Type 1 diabetes mellitus with hyperglycemia: Secondary | ICD-10-CM | POA: Insufficient documentation

## 2017-12-11 DIAGNOSIS — E118 Type 2 diabetes mellitus with unspecified complications: Secondary | ICD-10-CM | POA: Diagnosis present

## 2017-12-11 DIAGNOSIS — R531 Weakness: Principal | ICD-10-CM

## 2017-12-11 DIAGNOSIS — I130 Hypertensive heart and chronic kidney disease with heart failure and stage 1 through stage 4 chronic kidney disease, or unspecified chronic kidney disease: Secondary | ICD-10-CM | POA: Insufficient documentation

## 2017-12-11 DIAGNOSIS — I5032 Chronic diastolic (congestive) heart failure: Secondary | ICD-10-CM | POA: Diagnosis present

## 2017-12-11 DIAGNOSIS — E870 Hyperosmolality and hypernatremia: Secondary | ICD-10-CM | POA: Diagnosis not present

## 2017-12-11 DIAGNOSIS — H548 Legal blindness, as defined in USA: Secondary | ICD-10-CM | POA: Insufficient documentation

## 2017-12-11 DIAGNOSIS — M79671 Pain in right foot: Secondary | ICD-10-CM | POA: Diagnosis not present

## 2017-12-11 DIAGNOSIS — N179 Acute kidney failure, unspecified: Secondary | ICD-10-CM | POA: Diagnosis not present

## 2017-12-11 DIAGNOSIS — E1165 Type 2 diabetes mellitus with hyperglycemia: Secondary | ICD-10-CM

## 2017-12-11 DIAGNOSIS — D72829 Elevated white blood cell count, unspecified: Secondary | ICD-10-CM | POA: Diagnosis not present

## 2017-12-11 DIAGNOSIS — M79672 Pain in left foot: Secondary | ICD-10-CM | POA: Diagnosis present

## 2017-12-11 DIAGNOSIS — E1022 Type 1 diabetes mellitus with diabetic chronic kidney disease: Secondary | ICD-10-CM | POA: Insufficient documentation

## 2017-12-11 DIAGNOSIS — W19XXXA Unspecified fall, initial encounter: Secondary | ICD-10-CM | POA: Diagnosis not present

## 2017-12-11 DIAGNOSIS — E1140 Type 2 diabetes mellitus with diabetic neuropathy, unspecified: Secondary | ICD-10-CM | POA: Diagnosis present

## 2017-12-11 DIAGNOSIS — Z79899 Other long term (current) drug therapy: Secondary | ICD-10-CM | POA: Diagnosis not present

## 2017-12-11 DIAGNOSIS — E1100 Type 2 diabetes mellitus with hyperosmolarity without nonketotic hyperglycemic-hyperosmolar coma (NKHHC): Secondary | ICD-10-CM

## 2017-12-11 DIAGNOSIS — Z79890 Hormone replacement therapy (postmenopausal): Secondary | ICD-10-CM | POA: Insufficient documentation

## 2017-12-11 DIAGNOSIS — E1040 Type 1 diabetes mellitus with diabetic neuropathy, unspecified: Secondary | ICD-10-CM | POA: Diagnosis not present

## 2017-12-11 DIAGNOSIS — E1121 Type 2 diabetes mellitus with diabetic nephropathy: Secondary | ICD-10-CM | POA: Diagnosis present

## 2017-12-11 DIAGNOSIS — E1069 Type 1 diabetes mellitus with other specified complication: Secondary | ICD-10-CM | POA: Insufficient documentation

## 2017-12-11 DIAGNOSIS — I10 Essential (primary) hypertension: Secondary | ICD-10-CM | POA: Diagnosis present

## 2017-12-11 DIAGNOSIS — Z87891 Personal history of nicotine dependence: Secondary | ICD-10-CM | POA: Insufficient documentation

## 2017-12-11 LAB — CBC
HCT: 34 % — ABNORMAL LOW (ref 36.0–46.0)
Hemoglobin: 11.2 g/dL — ABNORMAL LOW (ref 12.0–15.0)
MCH: 25.6 pg — ABNORMAL LOW (ref 26.0–34.0)
MCHC: 32.9 g/dL (ref 30.0–36.0)
MCV: 77.6 fL — ABNORMAL LOW (ref 78.0–100.0)
PLATELETS: 201 10*3/uL (ref 150–400)
RBC: 4.38 MIL/uL (ref 3.87–5.11)
RDW: 15.6 % — ABNORMAL HIGH (ref 11.5–15.5)
WBC: 12.8 10*3/uL — AB (ref 4.0–10.5)

## 2017-12-11 LAB — URINALYSIS, ROUTINE W REFLEX MICROSCOPIC
BILIRUBIN URINE: NEGATIVE
Glucose, UA: 50 mg/dL — AB
HGB URINE DIPSTICK: NEGATIVE
KETONES UR: NEGATIVE mg/dL
Leukocytes, UA: NEGATIVE
NITRITE: NEGATIVE
PROTEIN: NEGATIVE mg/dL
SPECIFIC GRAVITY, URINE: 1.008 (ref 1.005–1.030)
pH: 5 (ref 5.0–8.0)

## 2017-12-11 LAB — BASIC METABOLIC PANEL
Anion gap: 12 (ref 5–15)
BUN: 50 mg/dL — AB (ref 6–20)
CALCIUM: 8.7 mg/dL — AB (ref 8.9–10.3)
CO2: 29 mmol/L (ref 22–32)
CREATININE: 1.83 mg/dL — AB (ref 0.44–1.00)
Chloride: 97 mmol/L — ABNORMAL LOW (ref 98–111)
GFR calc Af Amer: 34 mL/min — ABNORMAL LOW (ref >60)
GFR, EST NON AFRICAN AMERICAN: 29 mL/min — AB (ref >60)
GLUCOSE: 402 mg/dL — AB (ref 70–99)
POTASSIUM: 3.5 mmol/L (ref 3.5–5.1)
SODIUM: 138 mmol/L (ref 135–145)

## 2017-12-11 LAB — I-STAT BETA HCG BLOOD, ED (MC, WL, AP ONLY): I-stat hCG, quantitative: <5 m[IU]/mL (ref <5)

## 2017-12-11 LAB — CBG MONITORING, ED: GLUCOSE-CAPILLARY: 381 mg/dL — AB (ref 70–99)

## 2017-12-11 MED ORDER — SODIUM CHLORIDE 0.9 % IV BOLUS
1000.0000 mL | Freq: Once | INTRAVENOUS | Status: AC
  Administered 2017-12-12: 1000 mL via INTRAVENOUS

## 2017-12-11 MED ORDER — FENTANYL CITRATE (PF) 100 MCG/2ML IJ SOLN
50.0000 ug | Freq: Once | INTRAMUSCULAR | Status: AC
  Administered 2017-12-12: 50 ug via INTRAVENOUS
  Filled 2017-12-11: qty 2

## 2017-12-11 NOTE — ED Notes (Signed)
Attempt x 1 PIV unsuccessful, attempt x 1 with ultrasound successful.

## 2017-12-11 NOTE — Telephone Encounter (Signed)
Patient wants to know if she can be referred out to a Orthopedic. Please call patient back at [phone]

## 2017-12-11 NOTE — ED Notes (Signed)
CBG 381

## 2017-12-11 NOTE — Progress Notes (Addendum)
Pt provided verbal permission from the CSW to call her contacts to let them know the pt is at the [facility] ED.  CSW spoke to pt's sister Vassie Loll  at ph: [phone] who stated pt had a fall 2 weeks ago, last Thursday or Friday and then again last night (7/28) when the pt attempted to use her bedside toilet.  Pt's sister stated she had to go to the pt's home and help the pt back to her feet and back to bed.  Pt's sister is standing by to take the pt home if and when she is ready for D/C.  CSW also called pt's friend [employer] at ph: [phone] who helped the pt get to the ambulance and Miss Cobb stated hse would be en route shortly and stated, "I hope you all ain't Seabron Spates' send her home she needs to get to a rehab".  CSW also called pt's son Brice Potteiger at ph: [phone] and was told the CSW had reached the wrong number.  10:59 PM CSW spoke to the EDP who states pt doesn't look as if a fracture is indicated thus far, but pt has had x-rays and the results are not in yet.    CSW stated contacts who are ready to p/u the pt if needed (pt's sister Vassie Loll  at ph: [phone]) and are standing by should a ride be needed and the pt's friend [employer] at ph: [phone] who helped the pt get to the ambulance, who stated she is en route and who is actively advocating by phone for the pt to get "rehab".  CSW will update pt's RN.  CSW will continue to follow for D/C needs.  Amy [name]. Amy Freda, LCSW, LCAS, CSI Clinical Social Worker Ph: [phone]

## 2017-12-11 NOTE — ED Notes (Signed)
Bed: [facility] Expected date:  Expected time:  Means of arrival:  Comments: EMS 65 to female from home bilateral foot pain with CBG 464

## 2017-12-11 NOTE — ED Triage Notes (Signed)
Patient arrives by [facility] with complaints of diabetes, bilateral foot pain and hyperglycemia. EMS states 2 weeks ago, right foot pain and worked it's way up her right leg and now having pain in her left foot. Fall 2 weeks ago when the pain started and was diagnosed with a sprain right ankle/foot area. Pain has increased and patient is unable to get out of bed and unable to ambulate at home. Patient lives by herself-has an aide 8a-2p but states she did not take her insulin today because she could not get out of bed. Aide is unable to get patient out of bed. Patient is blind. CBG 464.

## 2017-12-11 NOTE — ED Notes (Signed)
Patient transported to X-ray and CT 

## 2017-12-11 NOTE — Telephone Encounter (Signed)
Pt states she sprained her foot a month, and it got better now it hurts again. I told pt we could take care of her feet in our office and transferred to schedulers.

## 2017-12-11 NOTE — Progress Notes (Signed)
CSW spoke to EDP who states pt's CT scan results are not yet in.    CSW spoke to ED CN who states pt was unable to ambulate without the help of three people and pt could not lie backwards on the bed from a sitting position without help.  ED CN states pt cannot return home tonight to live alone.  Pt's sister is standing by to assist as is pt's friend Miss Cobb who is bedside.  Pt also has a son but the pt gave the CSW an incorrect number.  Per the pt, she has an in-home aide paid for by the Medicaid CAP program who comes to assist the pt:  M-F from 8am-2pm and then 5pm-7pm Saturdays from 8am-12pm and 5-6pm Sundays from 7:30am-9am and then 2pm-6pm.  Pt has [employer] Medicare and Medicaid  2nd shift ED CSW will leave handoff for 1st shift ED CSW.  Please reconsult if future social work needs arise.  CSW signing off, as social work intervention is no longer needed.  Amy Guild. Mata Rowen, LCSW, LCAS, CSI Clinical Social Worker Ph: [phone]

## 2017-12-12 ENCOUNTER — Observation Stay (HOSPITAL_COMMUNITY): Payer: Medicare Other

## 2017-12-12 ENCOUNTER — Encounter (HOSPITAL_COMMUNITY): Payer: Self-pay

## 2017-12-12 DIAGNOSIS — R531 Weakness: Secondary | ICD-10-CM

## 2017-12-12 DIAGNOSIS — E1100 Type 2 diabetes mellitus with hyperosmolarity without nonketotic hyperglycemic-hyperosmolar coma (NKHHC): Secondary | ICD-10-CM

## 2017-12-12 DIAGNOSIS — E1165 Type 2 diabetes mellitus with hyperglycemia: Secondary | ICD-10-CM

## 2017-12-12 LAB — BASIC METABOLIC PANEL
Anion gap: 11 (ref 5–15)
BUN: 44 mg/dL — AB (ref 6–20)
CHLORIDE: 100 mmol/L (ref 98–111)
CO2: 30 mmol/L (ref 22–32)
Calcium: 8.9 mg/dL (ref 8.9–10.3)
Creatinine, Ser: 1.51 mg/dL — ABNORMAL HIGH (ref 0.44–1.00)
GFR calc Af Amer: 43 mL/min — ABNORMAL LOW (ref >60)
GFR calc non Af Amer: 37 mL/min — ABNORMAL LOW (ref >60)
GLUCOSE: 313 mg/dL — AB (ref 70–99)
POTASSIUM: 3.3 mmol/L — AB (ref 3.5–5.1)
Sodium: 141 mmol/L (ref 135–145)

## 2017-12-12 LAB — CBC
HEMATOCRIT: 35.7 % — AB (ref 36.0–46.0)
Hemoglobin: 11.8 g/dL — ABNORMAL LOW (ref 12.0–15.0)
MCH: 25.6 pg — ABNORMAL LOW (ref 26.0–34.0)
MCHC: 33.1 g/dL (ref 30.0–36.0)
MCV: 77.4 fL — AB (ref 78.0–100.0)
Platelets: 198 10*3/uL (ref 150–400)
RBC: 4.61 MIL/uL (ref 3.87–5.11)
RDW: 15.5 % (ref 11.5–15.5)
WBC: 11.4 10*3/uL — ABNORMAL HIGH (ref 4.0–10.5)

## 2017-12-12 LAB — GLUCOSE, CAPILLARY
GLUCOSE-CAPILLARY: 191 mg/dL — AB (ref 70–99)
GLUCOSE-CAPILLARY: 185 mg/dL — AB (ref 70–99)
GLUCOSE-CAPILLARY: 151 mg/dL — AB (ref 70–99)
Glucose-Capillary: 292 mg/dL — ABNORMAL HIGH (ref 70–99)
Glucose-Capillary: 244 mg/dL — ABNORMAL HIGH (ref 70–99)

## 2017-12-12 LAB — HEMOGLOBIN A1C
Hgb A1c MFr Bld: 9.3 % — ABNORMAL HIGH (ref 4.8–5.6)
Mean Plasma Glucose: 220.21 mg/dL

## 2017-12-12 LAB — HIV ANTIBODY (ROUTINE TESTING W REFLEX): HIV Screen 4th Generation wRfx: NONREACTIVE

## 2017-12-12 LAB — TSH: TSH: 0.279 u[IU]/mL — ABNORMAL LOW (ref 0.350–4.500)

## 2017-12-12 MED ORDER — ENOXAPARIN SODIUM 40 MG/0.4ML ~~LOC~~ SOLN
40.0000 mg | SUBCUTANEOUS | Status: DC
  Administered 2017-12-12 – 2017-12-14 (×3): 40 mg via SUBCUTANEOUS
  Filled 2017-12-12 (×3): qty 0.4

## 2017-12-12 MED ORDER — FLUTICASONE PROPIONATE 50 MCG/ACT NA SUSP
1.0000 | Freq: Every day | NASAL | Status: DC
  Administered 2017-12-12 – 2017-12-14 (×3): 1 via NASAL
  Filled 2017-12-12: qty 16

## 2017-12-12 MED ORDER — LORATADINE 10 MG PO TABS
10.0000 mg | ORAL_TABLET | Freq: Every day | ORAL | Status: DC
  Administered 2017-12-12 – 2017-12-14 (×3): 10 mg via ORAL
  Filled 2017-12-12 (×3): qty 1

## 2017-12-12 MED ORDER — INSULIN ASPART 100 UNIT/ML ~~LOC~~ SOLN
0.0000 [IU] | Freq: Three times a day (TID) | SUBCUTANEOUS | Status: DC
  Administered 2017-12-12: 5 [IU] via SUBCUTANEOUS
  Administered 2017-12-12 (×2): 3 [IU] via SUBCUTANEOUS
  Administered 2017-12-13 (×2): 2 [IU] via SUBCUTANEOUS
  Administered 2017-12-13 – 2017-12-14 (×2): 3 [IU] via SUBCUTANEOUS
  Administered 2017-12-14: 2 [IU] via SUBCUTANEOUS

## 2017-12-12 MED ORDER — GADOBENATE DIMEGLUMINE 529 MG/ML IV SOLN
20.0000 mL | Freq: Once | INTRAVENOUS | Status: AC | PRN
  Administered 2017-12-12: 20 mL via INTRAVENOUS

## 2017-12-12 MED ORDER — METHOCARBAMOL 500 MG PO TABS
750.0000 mg | ORAL_TABLET | Freq: Three times a day (TID) | ORAL | Status: DC | PRN
  Administered 2017-12-13 – 2017-12-14 (×3): 750 mg via ORAL
  Filled 2017-12-12 (×3): qty 2

## 2017-12-12 MED ORDER — LINACLOTIDE 145 MCG PO CAPS
145.0000 ug | ORAL_CAPSULE | Freq: Every day | ORAL | Status: DC | PRN
  Filled 2017-12-12: qty 1

## 2017-12-12 MED ORDER — POTASSIUM CHLORIDE CRYS ER 20 MEQ PO TBCR
40.0000 meq | EXTENDED_RELEASE_TABLET | Freq: Once | ORAL | Status: AC
  Administered 2017-12-12: 40 meq via ORAL
  Filled 2017-12-12: qty 2

## 2017-12-12 MED ORDER — LEVOTHYROXINE SODIUM 150 MCG PO TABS: 150.0000 ug | ORAL_TABLET | Freq: Every day | ORAL | Status: DC

## 2017-12-12 MED ORDER — INSULIN ASPART 100 UNIT/ML ~~LOC~~ SOLN
10.0000 [IU] | Freq: Three times a day (TID) | SUBCUTANEOUS | Status: DC
  Administered 2017-12-12 – 2017-12-14 (×8): 10 [IU] via SUBCUTANEOUS

## 2017-12-12 MED ORDER — TRAMADOL HCL 50 MG PO TABS
50.0000 mg | ORAL_TABLET | Freq: Four times a day (QID) | ORAL | Status: DC | PRN
  Administered 2017-12-12 – 2017-12-14 (×3): 50 mg via ORAL
  Filled 2017-12-12 (×3): qty 1

## 2017-12-12 MED ORDER — ALBUTEROL SULFATE HFA 108 (90 BASE) MCG/ACT IN AERS: 2.0000 | INHALATION_SPRAY | RESPIRATORY_TRACT | Status: DC | PRN

## 2017-12-12 MED ORDER — SODIUM CHLORIDE 0.9 % IV SOLN
INTRAVENOUS | Status: DC
  Administered 2017-12-12 (×2): via INTRAVENOUS

## 2017-12-12 MED ORDER — ACETAMINOPHEN 325 MG PO TABS
650.0000 mg | ORAL_TABLET | Freq: Four times a day (QID) | ORAL | Status: DC | PRN
  Administered 2017-12-13 (×2): 650 mg via ORAL
  Filled 2017-12-12 (×2): qty 2

## 2017-12-12 MED ORDER — SIMVASTATIN 20 MG PO TABS
20.0000 mg | ORAL_TABLET | Freq: Every day | ORAL | Status: DC
  Administered 2017-12-12 – 2017-12-14 (×3): 20 mg via ORAL
  Filled 2017-12-12 (×3): qty 1

## 2017-12-12 MED ORDER — FENTANYL CITRATE (PF) 100 MCG/2ML IJ SOLN
50.0000 ug | Freq: Once | INTRAMUSCULAR | Status: AC
  Administered 2017-12-12: 50 ug via INTRAVENOUS

## 2017-12-12 MED ORDER — ONDANSETRON HCL 4 MG/2ML IJ SOLN: 4.0000 mg | Freq: Four times a day (QID) | INTRAMUSCULAR | Status: DC | PRN

## 2017-12-12 MED ORDER — PREGABALIN 75 MG PO CAPS
150.0000 mg | ORAL_CAPSULE | Freq: Three times a day (TID) | ORAL | Status: DC
  Administered 2017-12-12 – 2017-12-13 (×4): 150 mg via ORAL
  Filled 2017-12-12 (×4): qty 2

## 2017-12-12 MED ORDER — ALBUTEROL SULFATE (2.5 MG/3ML) 0.083% IN NEBU: 2.5000 mg | INHALATION_SOLUTION | RESPIRATORY_TRACT | Status: DC | PRN

## 2017-12-12 MED ORDER — LEVOTHYROXINE SODIUM 75 MCG PO TABS
150.0000 ug | ORAL_TABLET | Freq: Every day | ORAL | Status: DC
  Administered 2017-12-12 – 2017-12-14 (×3): 150 ug via ORAL
  Filled 2017-12-12 (×3): qty 2

## 2017-12-12 MED ORDER — ONDANSETRON HCL 4 MG PO TABS: 4.0000 mg | ORAL_TABLET | Freq: Four times a day (QID) | ORAL | Status: DC | PRN

## 2017-12-12 MED ORDER — SENNOSIDES-DOCUSATE SODIUM 8.6-50 MG PO TABS: 1.0000 | ORAL_TABLET | Freq: Every evening | ORAL | Status: DC | PRN

## 2017-12-12 MED ORDER — ACETAMINOPHEN 650 MG RE SUPP: 650.0000 mg | Freq: Four times a day (QID) | RECTAL | Status: DC | PRN

## 2017-12-12 MED ORDER — PANTOPRAZOLE SODIUM 40 MG PO TBEC
40.0000 mg | DELAYED_RELEASE_TABLET | Freq: Every day | ORAL | Status: DC
  Administered 2017-12-12 – 2017-12-14 (×3): 40 mg via ORAL
  Filled 2017-12-12 (×3): qty 1

## 2017-12-12 MED ORDER — INSULIN GLARGINE 100 UNIT/ML ~~LOC~~ SOLN
60.0000 [IU] | Freq: Every day | SUBCUTANEOUS | Status: DC
  Administered 2017-12-12 – 2017-12-13 (×2): 60 [IU] via SUBCUTANEOUS
  Filled 2017-12-12 (×3): qty 0.6

## 2017-12-12 MED ORDER — FENTANYL CITRATE (PF) 100 MCG/2ML IJ SOLN
INTRAMUSCULAR | Status: AC
  Filled 2017-12-12: qty 2

## 2017-12-12 NOTE — H&P (Signed)
History and Physical  Amy [name] UXN:[id number] DOB: 09/15/1960 DOA: 12/11/2017   PCP: Velna Hatchet, MD   Patient coming from: Home   Chief Complaint: Leg pain, inability to ambulate   HPI: Amy [name] is a 57 y.o. female with medical history significant for type II DM with related nephropathy and blindness who is being admitted today under observation status due to worsening lower extremity weakness and pain. The patient lives alone with the help of local family and daytime aides. She was evaluated at [facility] last week after she fell when a home care aide moved a chair and accidentally caused her to fall. At that time, imaging was unremarkable and the patient was sent home. She says that at the time she was having bilateral foot pains, but was still able to ambulate. However over the last four days or so, her pain in her lower extremities has worsened. Two nights ago, she had to call her son at 2am to help her get back to bed from her bedside commode because she was having so much right leg and groin pain that she couldn't walk. Finally today her pain was so bad in her right hip/groin that she came to the ER for evaluation.   ED Course: Workup in the ED including cervical spinal and right hip imaging as well as labs have been relatively unremarkable except for some hyperglycemia and acute on chronic renal failure. However, she was unable to move out of the bed at all without max assist from three people, so hospitalist was asked to consider observation admission for further workup of her right hip pain as well as her ambulatory dysfunction.  Review of Systems: Please see HPI for pertinent positives and negatives. A complete 10 system review of systems are otherwise negative.  Past Medical History:  Diagnosis Date  . Arthritis   . Asthma   . Blind   . Blindness and low vision    left eye glass eye,  legally blind in right eye  . Diabetes mellitus   . Diabetic neuropathy ([place])   .  Hyperlipidemia   . Hypertension   . Hypothyroidism   . Spinal stenosis    Past Surgical History:  Procedure Laterality Date  . ABDOMINAL HYSTERECTOMY  1990  . [place]   x 2  . CHOLECYSTECTOMY  2008  . ENUCLEATION Bilateral 09/15/1998  . EYE SURGERY  2016   fitting artificial eye    Social History:  reports that she quit smoking about 25 years ago. Her smoking use included cigarettes. She has never used smokeless tobacco. She reports that she does not drink alcohol or use drugs.   Allergies  Allergen Reactions  . Penicillins Hives and Swelling    Has patient had a PCN reaction causing immediate rash, facial/tongue/throat swelling, SOB or lightheadedness with hypotension: yes- face swelling Has patient had a PCN reaction causing severe rash involving mucus membranes or skin necrosis: no Has patient had a PCN reaction that required hospitalization unknown (childhood allergy) Has patient had a PCN reaction occurring within the last 10 years: no If all of the above answers are "NO", then may proceed with Cephalosporin use.   . Codeine Nausea Only  . Iodine-131 Hives    hives  . Iohexol Hives and Rash    pt developed itching and hives along with nasal congestion; needs 13 hour premeds for future studies, Onset Date: [id number]   Code: HIVES, Desc: pt developed itching and hives along  with nasal congestion; needs 13 hour premeds for future studies, Onset Date: [id number]  Code: HIVES, Desc: pt developed itching and hives along with nasal congestion; needs 13 hour premeds for future studies, Onset Date: [id number]   . Lisinopril Cough  . Sulfa Antibiotics Nausea And Vomiting    Family History  Problem Relation Age of Onset  . Diabetes Sister   . Glaucoma Sister   . Hypertension Sister   . Stroke Brother   . Heart attack Paternal Aunt      Prior to Admission medications   Medication Sig Start Date End Date Taking? Authorizing Provider  albuterol (PROVENTIL  HFA;VENTOLIN HFA) 108 (90 BASE) MCG/ACT inhaler Inhale 2 puffs into the lungs every 4 (four) hours as needed for wheezing or shortness of breath. For short   Yes [provider]  cetirizine (ZYRTEC) 10 MG tablet Take 10 mg by mouth daily.   Yes [provider]  esomeprazole (NEXIUM) 40 MG capsule Take 40 mg by mouth daily at 12 noon.    Yes [provider]  fluticasone (FLONASE) 50 MCG/ACT nasal spray Place into both nostrils daily.   Yes [provider]  HUMALOG KWIKPEN 100 UNIT/ML KiwkPen Inject into the skin 3 (three) times daily. 12-12-17 take 8 units in morning, 10 units at lunch, 14 units at dinner 01/24/17  Yes [provider]  Insulin Degludec (TRESIBA FLEXTOUCH) 200 UNIT/ML SOPN Inject 60 Units into the skin at bedtime.    Yes [provider]  levothyroxine (SYNTHROID, LEVOTHROID) 150 MCG tablet Take 150 mcg by mouth daily before breakfast.   Yes [provider]  Linaclotide (LINZESS) 145 MCG CAPS capsule Take 145 mcg by mouth daily as needed (constipation).    Yes [provider]  LYRICA 150 MG capsule Take 150 mg by mouth 3 (three) times daily. 10/28/14  Yes [provider]  metolazone (ZAROXOLYN) 5 MG tablet TAKE 1 TABLET BY MOUTH ONCE DAILY AS NEEDED. TAKE FOR WEIGHT GAIN OF 3 LBS OVERNIGHT OR 5LBS IN A WEEK 11/15/17  Yes Bensimhon, Shaune Pascal, MD  potassium chloride SA (K-DUR,KLOR-CON) 20 MEQ tablet Take 2 tablets (40 mEq total) by mouth daily. 11/06/17  Yes Georgiana Shore, NP  simvastatin (ZOCOR) 20 MG tablet Take 20 mg by mouth daily. 11/24/17  Yes [provider]  spironolactone (ALDACTONE) 25 MG tablet Take 0.5 tablets (12.5 mg total) by mouth daily. 10/30/17  Yes Shirley Friar, PA-C  torsemide (DEMADEX) 20 MG tablet Take 2 tablets (40 mg total) by mouth daily. 10/24/17  Yes Georgiana Shore, NP  triamcinolone cream (KENALOG) 0.1 % Apply 1 application topically 2 (two) times daily.   Yes [provider]  acetaminophen (TYLENOL) 325 MG tablet Take 2 tablets (650 mg total) by mouth every 6 (six) hours as needed for mild pain or moderate pain. Patient not taking: Reported on 12/12/2017 11/29/17   Augusto Gamble B, NP  insulin glargine (LANTUS) 100 UNIT/ML injection Inject 0.4 mLs (40 Units total) into the skin at bedtime. 12/18/12 01/02/13  Reyne Dumas, MD    Physical Exam: BP (!) 117/58 (BP Location: Right Arm)   Pulse 73   Temp 98.8 F (37.1 C) (Oral)   Resp 17   Ht 4\' 11"  (1.499 m)   Wt 98.4 kg (217 lb)   SpO2 94%   BMI 43.83 kg/m   General:  Alert, oriented, calm, in no acute distress  Eyes: Right prosthesis, left eye blindness Neck: supple, no masses, trachea  mildline  Cardiovascular: RRR, no murmurs or rubs, no peripheral edema  Respiratory: clear to auscultation bilaterally, no wheezes, no crackles  Abdomen: soft, nontender, nondistended, normal bowel tones heard, obese Skin: dry, no rashes  Musculoskeletal: no joint effusions, right hip tenderness to palpation and pain with ROM Psychiatric: appropriate affect, normal speech  Neurologic: extraocular muscles intact, clear speech, moving all extremities with intact sensorium            Labs on Admission:  Basic Metabolic Panel: Recent Labs  Lab 12/07/17 1435 12/11/17 2021  NA 146* 138  K 3.4* 3.5  CL 104 97*  CO2 31 29  GLUCOSE 82 402*  BUN 32* 50*  CREATININE 1.43* 1.83*  CALCIUM 9.8 8.7*   Liver Function Tests: No results for input(s): AST, ALT, ALKPHOS, BILITOT, PROT, ALBUMIN in the last 168 hours. No results for input(s): LIPASE, AMYLASE in the last 168 hours. No results for input(s): AMMONIA in the last 168 hours. CBC: Recent Labs  Lab 12/07/17 1435 12/11/17 2021  WBC 13.7* 12.8*  NEUTROABS 10.2*  --   HGB 12.7 11.2*  HCT 40.3 34.0*  MCV 81.1 77.6*  PLT 195 201   Cardiac Enzymes: No results for input(s): CKTOTAL, CKMB, CKMBINDEX, TROPONINI in the last 168 hours.  BNP (last 3  results) Recent Labs    02/04/17 0243 07/13/17 1147  BNP 30.0 65.9    ProBNP (last 3 results) Recent Labs    12/27/16 1135  PROBNP 83    CBG: Recent Labs  Lab 12/07/17 1416 12/07/17 1700 12/11/17 2106  GLUCAP 43* 86 381*    Radiological Exams on Admission: Dg Ankle Complete Left  Result Date: 12/11/2017 CLINICAL DATA:  Fall 2 weeks ago, recent left ankle pain EXAM: LEFT ANKLE COMPLETE - 3+ VIEW COMPARISON:  None. FINDINGS: No fracture or dislocation is seen. The ankle mortise is intact. The base of the fifth metatarsal is unremarkable. The visualized soft tissues are unremarkable. IMPRESSION: Negative. Electronically Signed   By: Julian Hy M.D.   On: 12/11/2017 23:35   Ct Cervical Spine Wo Contrast  Result Date: 12/12/2017 CLINICAL DATA:  Neck pain after fall 2 weeks ago. EXAM: CT CERVICAL SPINE WITHOUT CONTRAST TECHNIQUE: Multidetector CT imaging of the cervical spine was performed without intravenous contrast. Multiplanar CT image reconstructions were also generated. COMPARISON:  None. FINDINGS: ALIGNMENT: Straightened lordosis.  Vertebral bodies in alignment. SKULL BASE AND VERTEBRAE: Cervical vertebral bodies and posterior elements are intact. 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 C5-6 and C6-7 auto interbody arthrodesis. C2-3 endplate irregularity, high maintained. C1-2 articulation maintained. No destructive bone lesions. SOFT TISSUES AND SPINAL CANAL: Mild prevertebral soft tissue swelling. DISC LEVELS: Moderate canal stenosis C2-3, C4-5, C5-6 and C6-7. Severe bilateral C3-4, RIGHT C4-5, LEFT C5-6 neural foraminal narrowing. UPPER CHEST: Lung apices are clear. OTHER: None. IMPRESSION: 1. No acute fracture or malalignment. 2. Mild prevertebral soft tissue swelling may be infectious or inflammatory. C2-3 endplate irregularity potentially is associated. MRI of the cervical spine with and without contrast is recommended. 3. Multilevel moderate canal stenosis and severe  neural foraminal narrowing. Electronically Signed   By: Elon Alas M.D.   On: 12/12/2017 00:01   Dg Knee Complete 4 Views Right  Result Date: 12/11/2017 CLINICAL DATA:  Fall 2 weeks ago EXAM: RIGHT KNEE - COMPLETE 4+ VIEW COMPARISON:  None. FINDINGS: No fracture or dislocation is seen. The joint spaces are preserved. The visualized soft tissues are unremarkable. No suprapatellar knee joint effusion. IMPRESSION: Negative. Electronically Signed  By: Julian Hy M.D.   On: 12/11/2017 23:34   Dg Hip Unilat W Or Wo Pelvis 2-3 Views Right  Result Date: 12/11/2017 CLINICAL DATA:  Fall 2 weeks ago, right hip pain EXAM: DG HIP (WITH OR WITHOUT PELVIS) 2-3V RIGHT COMPARISON:  None. FINDINGS: No fracture or dislocation is seen. Bilateral hip joint spaces are symmetric. Visualized bony pelvis appears intact. IMPRESSION: Negative. Electronically Signed   By: Julian Hy M.D.   On: 12/11/2017 23:34    Assessment/Plan Present on Admission: . Hypothyroidism . Diabetic neuropathy ([place]) . Diabetic nephropathy ([place]) . Hypertension . Chronic diastolic heart failure ([place]) . DM (diabetes mellitus), type 2, uncontrolled, with hyperosmolarity ([place]) . Spinal stenosis, multilevel  Pleasant 57 year old [place] American female with history of poorly controlled Type II diabetes and associated CKD and blindness who has continued worsening right hip pain and ambulatory dysfunction after a mechanical fall at home four days ago. Observation admission to further workup her right hip pain, and consider Rehabilitation placement after physical therapy evaluation if workup is negative for acute fracture.   Weakness and Pain in Right Leg - given her history I am concerned about a right hip subacute fracture not apparent on XR. Will order noncontrast CT of the right hip and pelvis for the morning. Could also simply be a bruise or muscle injury that will require therapy. She's unlikely to be able to return home  regardless, will consult PT to work with her in the event of negative imaging in the AM. Anticipate need for SNF/SAR.   Acute on CKD - baseline Cr around 1.5, currently slightly worse at 1.8. Will hold chronic diuretics and hydrate very gently. Check labs in AM and have low threshold to DC IVF to avoid development of acute diastolic heart failure.    Blindness   Hypothyroidism - will check TSH due to weakness, continue Synthroid   Diabetic neuropathy ([place]) - continue home Lyrica   Diabetic nephropathy ([place]) - cause of her CKD   DM (diabetes mellitus), type 2, uncontrolled, with hyperosmolarity ([facility])   Spinal stenosis, multilevel - C spine imaging with concerns for soft tissue abnormality, recommends MRI with contrast if further evaluation necessary. She cannot tolerate contrast with her current renal dysfunction. Will defer further work up for now at least until renal function improves.   Hypertension - continue home medications   Chronic diastolic heart failure ([place]) - currently no evidence of exacerbation, patient look euvolemic. Note we are holding her diuretics due to her acute on CKD and hydrating very gently but need to keep a close eye on her fluid status.  DVT prophylaxis: Lovenox   Code Status: FULL   Family Communication: None present.   Disposition Plan: Likely will need SAR/SNF at DC.   Consults called: None   Admission status: Observation   Time spent: 40 minutes  Yumiko Alkins Marry Guan MD [facility] Pager [phone]  If 7PM-7AM, please contact night-coverage [email or web address] Password [email or web address]  12/12/2017, 2:25 AM

## 2017-12-12 NOTE — Evaluation (Signed)
Physical Therapy Evaluation Patient Details Name: Amy [name] MRN: [id number] DOB: [birth year] Today's Date: 12/12/2017   History of Present Illness  Pt arrived to ED on 7/29 with bilat foot pain and hyperglycemia. Pt had fall 2 weeks ago and sprained her R ankle, has been unable to ambulate at home as a result. Pt has history of uncontrolled DM II, blindness, peripheral neuropathy, spinal stenosis, HTN, CHF.   Clinical Impression   Pt is a 57 YO female admitted for hyperglycemia and bilat foot pain. Pt presents with difficulty with bed mobility, transfers, and ambulation. Pt with decreased activity tolerance and has a history of recent falls. Pt would benefit from acute PT to address these deficits. Negative prognostic factors for pt include blindness, neuropathy, and poorly controlled DM II. Recommending SNF at this time, due to pt living alone and pt stating she does most ADLs by herself. Will continue to follow and updated follow up recommendations as appropriate.     Follow Up Recommendations SNF;Supervision for mobility/OOB    Equipment Recommendations  Rolling walker with 5" wheels    Recommendations for Other Services       Precautions / Restrictions Precautions Precautions: Fall Required Braces or Orthoses: Other Brace/Splint Other Brace/Splint: R ankle  Restrictions Weight Bearing Restrictions: No      Mobility  Bed Mobility Overal bed mobility: Needs Assistance Bed Mobility: Supine to Sit     Supine to sit: Mod assist;Max assist     General bed mobility comments: max assist for initiation of movement/trunk elevation and scooting to EOB, mod assist for LE management, completing supine to sit movement and steadying. Max verbal cuing for bed mobility due to visual impairments.   Transfers Overall transfer level: Needs assistance Equipment used: Rolling walker (2 wheeled) Transfers: Sit to/from Stand Sit to Stand: Mod assist;+2 physical assistance          General transfer comment: physical assist for power up, hip extension, and hand placement on RW and bed.   Ambulation/Gait Ambulation/Gait assistance: Min assist;+2 physical assistance Gait Distance (Feet): (3 steps forward, pivot to chair) Assistive device: Rolling walker (2 wheeled) Gait Pattern/deviations: Step-to pattern;Decreased step length - right;Decreased stance time - right;Decreased weight shift to right;Trunk flexed;Antalgic Gait velocity: very decreased    General Gait Details: max verbal cuing for directions, weight shifting, and stepping due to blindness. Pt with lack of weight shift to RLE secondary to pain.   Stairs            Wheelchair Mobility    Modified Rankin (Stroke Patients Only)       Balance Overall balance assessment: Needs assistance Sitting-balance support: Bilateral upper extremity supported Sitting balance-Leahy Scale: Poor Sitting balance - Comments: Sat EOB for 1 minute with bilat UE support   Standing balance support: Bilateral upper extremity supported Standing balance-Leahy Scale: Poor Standing balance comment: required constant guarding and steadying                              Pertinent Vitals/Pain Pain Assessment: Faces Faces Pain Scale: Hurts even more Pain Location: R ankle and entire leg  Pain Descriptors / Indicators: Grimacing;Sore;Guarding Pain Intervention(s): Limited activity within patient's tolerance;Repositioned;Monitored during session    Home Living Family/patient expects to be discharged to:: Private residence Living Arrangements: Alone Available Help at Discharge: Home health(aide assists pt in home from 8-2 pm ) Type of Home: House Home Access: Level entry     Home  Layout: One level Home Equipment: Cane - single point;Wheelchair - manual Additional Comments: rollator    Prior Function Level of Independence: Needs assistance   Gait / Transfers Assistance Needed: uses cane for longer  distances, utilizes surfaces in home for ambulation   ADL's / Homemaking Assistance Needed: pt reports being independent in cooking and other ADLs, but has home health aide to assist from 8-2        Hand Dominance        Extremity/Trunk Assessment   Upper Extremity Assessment Upper Extremity Assessment: Generalized weakness    Lower Extremity Assessment Lower Extremity Assessment: Generalized weakness       Communication   Communication: Other (comment)(blind )  Cognition Arousal/Alertness: Awake/alert Behavior During Therapy: WFL for tasks assessed/performed Overall Cognitive Status: Within Functional Limits for tasks assessed                                        General Comments      Exercises     Assessment/Plan    PT Assessment Patient needs continued PT services  PT Problem List Decreased strength;Decreased coordination;Pain;Decreased activity tolerance;Decreased knowledge of use of DME;Decreased balance;Decreased safety awareness;Decreased mobility;Obesity       PT Treatment Interventions DME instruction;Therapeutic activities;Gait training;Functional mobility training;Patient/family education;Therapeutic exercise    PT Goals (Current goals can be found in the Care Plan section)  Acute Rehab PT Goals Patient Stated Goal: progress mobility  PT Goal Formulation: With patient Time For Goal Achievement: 12/19/17 Potential to Achieve Goals: Fair    Frequency Min 2X/week   Barriers to discharge        Co-evaluation               AM-PAC PT "6 Clicks" Daily Activity  Outcome Measure Difficulty turning over in bed (including adjusting bedclothes, sheets and blankets)?: Unable Difficulty moving from lying on back to sitting on the side of the bed? : Unable Difficulty sitting down on and standing up from a chair with arms (e.g., wheelchair, bedside commode, etc,.)?: Unable Help needed moving to and from a bed to chair (including a  wheelchair)?: A Lot Help needed walking in hospital room?: A Lot Help needed climbing 3-5 steps with a railing? : A Lot 6 Click Score: 9    End of Session Equipment Utilized During Treatment: Gait belt Activity Tolerance: Patient limited by pain;Patient limited by fatigue Patient left: in chair;with chair alarm set;with call bell/phone within reach;with family/visitor present   PT Visit Diagnosis: Other abnormalities of gait and mobility (R26.89);Muscle weakness (generalized) (M62.81)    Time: 6440-3474 PT Time Calculation (min) (ACUTE ONLY): 28 min   Charges:   PT Evaluation $PT Eval Low Complexity: 1 Low          Ruba Outen Conception Chancy, PT, DPT  Pager # [phone]  Shannan Slinker D Lakenzie Mcclafferty 12/12/2017, 4:42 PM

## 2017-12-12 NOTE — Progress Notes (Signed)
[facility]  Patient admitted after midnight by Dr. Hollice Gong, please see H&P for further details.  Patient seen chart reviewed.  57 year old female with medical history significant for type 2 diabetes mellitus, CKD and blindness presented to the emergency department complaining of leg pain and inability to ambulate.  Patient had a mechanical fall about a week ago and since then she is unable to ambulate and having right lower extremity pain from ankle to the hip.  Upon ED evaluation imaging has been unremarkable, right ankle with sprain.  Patient was noted to be hyperglycemic and on AKI therefore she was placed in observation for hydration and further work-up of lower extremity pain.  CT of the hip shows severe arthritis.  MRI cervical spine was ordered and pending.   Plan: Await MRI results, continue pain management with Tylenol and tramadol.  PT evaluation.  Continue gentle IV fluid and monitor renal function in a.m.  Glucose has improved.  Continue to monitor CBGs. Rest per H&P.   Chipper Oman, MD

## 2017-12-12 NOTE — Progress Notes (Signed)
PT Cancellation Note  Patient Details Name: Amy [name] MRN: [id number] DOB: 1961-01-06   Cancelled Treatment:    Reason Eval/Treat Not Completed: Other (comment)(awaiting C-spine MRI)   Theodis Kinsel,KATHrine E 12/12/2017, 11:22 AM Carmelia Bake, PT, DPT 12/12/2017 Pager: [phone]

## 2017-12-12 NOTE — Progress Notes (Signed)
Inpatient Diabetes Program Recommendations  AACE/ADA: New Consensus Statement on Inpatient Glycemic Control (2015)  Target Ranges:  Prepandial:   less than 140 mg/dL      Peak postprandial:   less than 180 mg/dL (1-2 hours)      Critically ill patients:  140 - 180 mg/dL   Lab Results  Component Value Date   GLUCAP 191 (H) 12/12/2017   HGBA1C 9.3 (H) 12/11/2017    Review of Glycemic Control  Diabetes history: DM1 Outpatient Diabetes medications: Humalog 12-23-12 tidwc, Tresiba 60 units QHS Current orders for Inpatient glycemic control: Lantus 60 units QHS, Novolog 0-15 units tidwc + 10 units tidwc  HgbA1C - 9.3%. Pt states she takes insulin as prescribed. HgbA1C has been "a lot higher" in the past.  Needs new meter since current one is broken.  Inpatient Diabetes Program Recommendations:     Add HS correction Needs prescription at discharge for Prodigy Autocode glucose meter.  Will likely go to SNF when discharged.  Thank you. [name], RD, LDN, CDE Inpatient Diabetes Coordinator [phone]

## 2017-12-12 NOTE — ED Notes (Signed)
ED TO INPATIENT HANDOFF REPORT  Name/Age/Gender Amy [name] 57 y.o. female  Code Status    Code Status Orders  (From admission, onward)        Start     Ordered   12/12/17 0205  Full code  Continuous     12/12/17 0205    Code Status History    Date Active Date Inactive Code Status Order ID Comments User Context   01/30/2016 1531 02/01/2016 1707 Full Code [id number]  Ivor Costa, MD ED   09/12/2015 0345 09/21/2015 1842 Full Code [id number]  Norval Morton, MD ED   07/05/2015 1438 07/08/2015 1656 Full Code [id number]  Reubin Milan, MD Inpatient   11/01/2014 1535 11/03/2014 1834 Full Code [id number]  Domenic Polite, MD Inpatient   08/08/2014 1545 08/12/2014 2021 Full Code [id number]  Modena Jansky, MD Inpatient   08/06/2012 1623 08/09/2012 1717 Full Code [id number]  Geradine Girt, DO Inpatient   08/13/2011 2345 08/16/2011 1958 Full Code [id number]  Guinevere Ferrari, RN Inpatient   05/24/2011 0038 05/27/2011 2106 Full Code [id number]  Mayer Camel, RN Inpatient      Home/SNF/Other Home  Chief Complaint Foot Pain  Level of Care/Admitting Diagnosis ED Disposition    ED Disposition Condition [facility]: [facility] [[id number]]  Level of Care: Med-Surg [16]  Diagnosis: Weakness [423536]  Admitting Physician: Rosemarie Beath [facility] [[id number]]  Attending Physician: Hollice Gong, [facility] [[id number]]  PT Class (Do Not Modify): Observation [104]  PT Acc Code (Do Not Modify): Observation [10022]       Medical History Past Medical History:  Diagnosis Date  . Arthritis   . Asthma   . Blind   . Blindness and low vision    left eye glass eye,  legally blind in right eye  . Diabetes mellitus   . Diabetic neuropathy ([place])   . Hyperlipidemia   . Hypertension   . Hypothyroidism   . Spinal stenosis     Allergies Allergies  Allergen Reactions  . Penicillins Hives and Swelling    Has patient had a PCN reaction causing immediate rash,  facial/tongue/throat swelling, SOB or lightheadedness with hypotension: yes- face swelling Has patient had a PCN reaction causing severe rash involving mucus membranes or skin necrosis: no Has patient had a PCN reaction that required hospitalization unknown (childhood allergy) Has patient had a PCN reaction occurring within the last 10 years: no If all of the above answers are "NO", then may proceed with Cephalosporin use.   . Codeine Nausea Only  . Iodine-131 Hives    hives  . Iohexol Hives and Rash    pt developed itching and hives along with nasal congestion; needs 13 hour premeds for future studies, Onset Date: [id number]   Code: HIVES, Desc: pt developed itching and hives along with nasal congestion; needs 13 hour premeds for future studies, Onset Date: [id number]  Code: HIVES, Desc: pt developed itching and hives along with nasal congestion; needs 13 hour premeds for future studies, Onset Date: [id number]   . Lisinopril Cough  . Sulfa Antibiotics Nausea And Vomiting    IV Location/Drains/Wounds Patient Lines/Drains/Airways Status   Active Line/Drains/Airways    Name:   Placement date:   Placement time:   Site:   Days:   Peripheral IV 12/11/17 Right Antecubital   12/11/17    2025    Antecubital   1          Labs/Imaging Results for orders  placed or performed during the hospital encounter of 12/11/17 (from the past 48 hour(s))  Basic metabolic panel     Status: Abnormal   Collection Time: 12/11/17  8:21 PM  Result Value Ref Range   Sodium 138 135 - 145 mmol/L   Potassium 3.5 3.5 - 5.1 mmol/L   Chloride 97 (L) 98 - 111 mmol/L   CO2 29 22 - 32 mmol/L   Glucose, Bld 402 (H) 70 - 99 mg/dL   BUN 50 (H) 6 - 20 mg/dL   Creatinine, Ser 1.83 (H) 0.44 - 1.00 mg/dL   Calcium 8.7 (L) 8.9 - 10.3 mg/dL   GFR calc non Af Amer 29 (L) >60 mL/min   GFR calc Af Amer 34 (L) >60 mL/min    Comment: (NOTE) The eGFR has been calculated using the CKD EPI equation. This calculation has not been  validated in all clinical situations. eGFR's persistently <60 mL/min signify possible Chronic Kidney Disease.    Anion gap 12 5 - 15    Comment: Performed at [facility], [place] [address], [place], [place] [address]  CBC     Status: Abnormal   Collection Time: 12/11/17  8:21 PM  Result Value Ref Range   WBC 12.8 (H) 4.0 - 10.5 K/uL   RBC 4.38 3.87 - 5.11 MIL/uL   Hemoglobin 11.2 (L) 12.0 - 15.0 g/dL   HCT 34.0 (L) 36.0 - 46.0 %   MCV 77.6 (L) 78.0 - 100.0 fL   MCH 25.6 (L) 26.0 - 34.0 pg   MCHC 32.9 30.0 - 36.0 g/dL   RDW 15.6 (H) 11.5 - 15.5 %   Platelets 201 150 - 400 K/uL    Comment: Performed at [facility], [place] [address] [place], [place] [address]  I-Stat beta hCG blood, ED     Status: None   Collection Time: 12/11/17  8:30 PM  Result Value Ref Range   I-stat hCG, quantitative <5.0 <5 mIU/mL   Comment 3            Comment:   GEST. AGE      CONC.  (mIU/mL)   <=1 WEEK        5 - 50     2 WEEKS       50 - 500     3 WEEKS       100 - 10,000     4 WEEKS     1,000 - 30,000        FEMALE AND NON-PREGNANT FEMALE:     LESS THAN 5 mIU/mL   CBG monitoring, ED     Status: Abnormal   Collection Time: 12/11/17  9:06 PM  Result Value Ref Range   Glucose-Capillary 381 (H) 70 - 99 mg/dL  Urinalysis, Routine w reflex microscopic     Status: Abnormal   Collection Time: 12/11/17 11:12 PM  Result Value Ref Range   Color, Urine STRAW (A) YELLOW   APPearance CLEAR CLEAR   Specific Gravity, Urine 1.008 1.005 - 1.030   pH 5.0 5.0 - 8.0   Glucose, UA 50 (A) NEGATIVE mg/dL   Hgb urine dipstick NEGATIVE NEGATIVE   Bilirubin Urine NEGATIVE NEGATIVE   Ketones, ur NEGATIVE NEGATIVE mg/dL   Protein, ur NEGATIVE NEGATIVE mg/dL   Nitrite NEGATIVE NEGATIVE   Leukocytes, UA NEGATIVE NEGATIVE    Comment: Performed at [facility], [place] [address] [place], [place] [address]   Dg Ankle Complete Left  Result Date:  12/11/2017 CLINICAL DATA:   Fall 2 weeks ago, recent left ankle pain EXAM: LEFT ANKLE COMPLETE - 3+ VIEW COMPARISON:  None. FINDINGS: No fracture or dislocation is seen. The ankle mortise is intact. The base of the fifth metatarsal is unremarkable. The visualized soft tissues are unremarkable. IMPRESSION: Negative. Electronically Signed   By: Julian Hy M.D.   On: 12/11/2017 23:35   Ct Cervical Spine Wo Contrast  Result Date: 12/12/2017 CLINICAL DATA:  Neck pain after fall 2 weeks ago. EXAM: CT CERVICAL SPINE WITHOUT CONTRAST TECHNIQUE: Multidetector CT imaging of the cervical spine was performed without intravenous contrast. Multiplanar CT image reconstructions were also generated. COMPARISON:  None. FINDINGS: ALIGNMENT: Straightened lordosis.  Vertebral bodies in alignment. SKULL BASE AND VERTEBRAE: Cervical vertebral bodies and posterior elements are intact. 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 C5-6 and C6-7 auto interbody arthrodesis. C2-3 endplate irregularity, high maintained. C1-2 articulation maintained. No destructive bone lesions. SOFT TISSUES AND SPINAL CANAL: Mild prevertebral soft tissue swelling. DISC LEVELS: Moderate canal stenosis C2-3, C4-5, C5-6 and C6-7. Severe bilateral C3-4, RIGHT C4-5, LEFT C5-6 neural foraminal narrowing. UPPER CHEST: Lung apices are clear. OTHER: None. IMPRESSION: 1. No acute fracture or malalignment. 2. Mild prevertebral soft tissue swelling may be infectious or inflammatory. C2-3 endplate irregularity potentially is associated. MRI of the cervical spine with and without contrast is recommended. 3. Multilevel moderate canal stenosis and severe neural foraminal narrowing. Electronically Signed   By: Elon Alas M.D.   On: 12/12/2017 00:01   Dg Knee Complete 4 Views Right  Result Date: 12/11/2017 CLINICAL DATA:  Fall 2 weeks ago EXAM: RIGHT KNEE - COMPLETE 4+ VIEW COMPARISON:  None. FINDINGS: No fracture or dislocation is seen. The joint spaces are preserved. The visualized soft  tissues are unremarkable. No suprapatellar knee joint effusion. IMPRESSION: Negative. Electronically Signed   By: Julian Hy M.D.   On: 12/11/2017 23:34   Dg Hip Unilat W Or Wo Pelvis 2-3 Views Right  Result Date: 12/11/2017 CLINICAL DATA:  Fall 2 weeks ago, right hip pain EXAM: DG HIP (WITH OR WITHOUT PELVIS) 2-3V RIGHT COMPARISON:  None. FINDINGS: No fracture or dislocation is seen. Bilateral hip joint spaces are symmetric. Visualized bony pelvis appears intact. IMPRESSION: Negative. Electronically Signed   By: Julian Hy M.D.   On: 12/11/2017 23:34    Pending Labs Unresulted Labs (From admission, onward)   Start     Ordered   12/12/17 8938  Basic metabolic panel  Tomorrow morning,   R     12/12/17 0205   12/12/17 0500  CBC  Tomorrow morning,   R     12/12/17 0205   12/12/17 0224  Hemoglobin A1c  Once,   R    Comments:  To assess prior glycemic control    12/12/17 0223   12/12/17 0205  TSH  Once,   R     12/12/17 0205   12/12/17 0204  HIV antibody (Routine Testing)  Once,   R     12/12/17 0205      Vitals/Pain Today's Vitals   12/11/17 1949 12/11/17 2028 12/11/17 2233 12/12/17 0022  BP: (!) 116/59  (!) 89/44 (!) 117/58  Pulse: 73  70 73  Resp: '14  15 17  ' Temp: 98.8 F (37.1 C)     TempSrc: Oral     SpO2: 96%  96% 94%  Weight: 217 lb (98.4 kg)     Height: '4\' 11"'  (1.499 m)     PainSc:  10-Worst  pain ever      Isolation Precautions No active isolations  Medications Medications  fentaNYL (SUBLIMAZE) injection 50 mcg (has no administration in time range)  albuterol (PROVENTIL HFA;VENTOLIN HFA) 108 (90 Base) MCG/ACT inhaler 2 puff (has no administration in time range)  loratadine (CLARITIN) tablet 10 mg (has no administration in time range)  pantoprazole (PROTONIX) EC tablet 40 mg (has no administration in time range)  fluticasone (FLONASE) 50 MCG/ACT nasal spray 1 spray (has no administration in time range)  Insulin Degludec SOPN 60 Units (has no  administration in time range)  insulin lispro (HUMALOG) KwikPen 10 Units (has no administration in time range)  levothyroxine (SYNTHROID, LEVOTHROID) tablet 150 mcg (has no administration in time range)  linaclotide (LINZESS) capsule 145 mcg (has no administration in time range)  pregabalin (LYRICA) capsule 150 mg (has no administration in time range)  simvastatin (ZOCOR) tablet 20 mg (has no administration in time range)  0.9 %  sodium chloride infusion (has no administration in time range)  acetaminophen (TYLENOL) tablet 650 mg (has no administration in time range)    Or  acetaminophen (TYLENOL) suppository 650 mg (has no administration in time range)  senna-docusate (Senokot-S) tablet 1 tablet (has no administration in time range)  ondansetron (ZOFRAN) tablet 4 mg (has no administration in time range)    Or  ondansetron (ZOFRAN) injection 4 mg (has no administration in time range)  insulin aspart (novoLOG) injection 0-15 Units (has no administration in time range)  fentaNYL (SUBLIMAZE) injection 50 mcg (50 mcg Intravenous Given 12/12/17 0015)  sodium chloride 0.9 % bolus 1,000 mL (1,000 mLs Intravenous New Bag/Given 12/12/17 0016)    Mobility non-ambulatory (use steady for restroom)

## 2017-12-12 NOTE — ED Provider Notes (Signed)
[facility] Provider Note   CSN: [id number] Arrival date & time: 12/11/17  1939     History   Chief Complaint Chief Complaint  Patient presents with  . Foot Pain  . Hyperglycemia    HPI Amy [name] is a 57 y.o. female with history of diabetes, hypertension, hypothyroidism, blindness, asthma who presents with bilateral foot pain, right leg pain, hyperglycemia.  Patient reports she fell last week and was evaluated with right ankle x-ray and C-spine x-ray.  She did not have right hip pain at the time, but she has had progressively worsening right leg pain that has made her unable to bear weight on her leg.  She reports it was so bad today she did not get out of bed and missed her insulin because of it.  She also continues to have neck pain.  Patient lives by herself, but does have an aide in her home during parts of the day to help her.  She denies any numbness or tingling in her leg other than her typical neuropathy.  She denies any new injuries to it.  She denies any fevers, chest pain, shortness of breath, abdominal pain, nausea, vomiting, urinary symptoms.  HPI  Past Medical History:  Diagnosis Date  . Arthritis   . Asthma   . Blind   . Blindness and low vision    left eye glass eye,  legally blind in right eye  . Diabetes mellitus   . Diabetic neuropathy ([place])   . Hyperlipidemia   . Hypertension   . Hypothyroidism   . Spinal stenosis     Patient Active Problem List   Diagnosis Date Noted  . Weakness 12/12/2017  . Obstructive sleep apnea on CPAP 02/09/2017  . Chronic diastolic heart failure ([place]) 11/08/2016  . Near syncope 01/30/2016  . SOB (shortness of breath)   . CAP (community acquired pneumonia) 09/12/2015  . Hypoxia 09/12/2015  . Hypertension 07/05/2015  . Hypotension 07/05/2015  . Diabetes mellitus with neurological manifestations ([place]) 11/04/2014  . Hyperlipidemia LDL goal <70 11/04/2014  . Spinal stenosis, multilevel 11/04/2014  .  Hyperkalemia 11/01/2014  . Hypoglycemia 08/09/2014  . DM (diabetes mellitus), type 2, uncontrolled, with hyperosmolarity ([place]) 08/08/2014  . Elevated troponin 08/08/2014  . Nausea vomiting and diarrhea 08/08/2014  . Dizziness 08/06/2012  . Orthostatic hypotension 08/06/2012  . Hypernatremia 08/06/2012  . Acute gastroenteritis 08/13/2011  . Gastroparesis 08/13/2011  . Chest pain 08/13/2011  . DM (diabetes mellitus) ([place]) 08/13/2011  . Oral thrush 08/13/2011  . Gastroenteritis 05/23/2011  . Dehydration 05/23/2011  . Blindness 05/23/2011  . DM type 1, not at goal, causing eye disease ([place]) 05/23/2011  . Asthma 05/23/2011  . Diarrhea 05/23/2011  . Vomiting 05/23/2011  . Hypothyroidism 05/23/2011  . Diabetic neuropathy ([place]) 05/23/2011  . Diabetic nephropathy ([place]) 05/23/2011    Past Surgical History:  Procedure Laterality Date  . ABDOMINAL HYSTERECTOMY  1990  . [place]   x 2  . CHOLECYSTECTOMY  2008  . ENUCLEATION Bilateral 09/15/1998  . EYE SURGERY  2016   fitting artificial eye     OB History   None    Obstetric Comments  Pt has two sons.         Home Medications    Prior to Admission medications   Medication Sig Start Date End Date Taking? Authorizing Provider  albuterol (PROVENTIL HFA;VENTOLIN HFA) 108 (90 BASE) MCG/ACT inhaler Inhale 2 puffs into the lungs every 4 (four) hours  as needed for wheezing or shortness of breath. For short   Yes [provider]  cetirizine (ZYRTEC) 10 MG tablet Take 10 mg by mouth daily.   Yes [provider]  esomeprazole (NEXIUM) 40 MG capsule Take 40 mg by mouth daily at 12 noon.    Yes [provider]  fluticasone (FLONASE) 50 MCG/ACT nasal spray Place into both nostrils daily.   Yes [provider]  HUMALOG KWIKPEN 100 UNIT/ML KiwkPen Inject into the skin 3 (three) times daily. 12-12-17 take 8 units in morning, 10 units at lunch, 14 units at dinner 01/24/17  Yes [provider]  Insulin Degludec (TRESIBA FLEXTOUCH) 200 UNIT/ML SOPN Inject 60 Units into the skin at bedtime.    Yes [provider]  levothyroxine (SYNTHROID, LEVOTHROID) 150 MCG tablet Take 150 mcg by mouth daily before breakfast.   Yes [provider]  Linaclotide (LINZESS) 145 MCG CAPS capsule Take 145 mcg by mouth daily as needed (constipation).    Yes [provider]  LYRICA 150 MG capsule Take 150 mg by mouth 3 (three) times daily. 10/28/14  Yes [provider]  metolazone (ZAROXOLYN) 5 MG tablet TAKE 1 TABLET BY MOUTH ONCE DAILY AS NEEDED. TAKE FOR WEIGHT GAIN OF 3 LBS OVERNIGHT OR 5LBS IN A WEEK 11/15/17  Yes Bensimhon, Shaune Pascal, MD  potassium chloride SA (K-DUR,KLOR-CON) 20 MEQ tablet Take 2 tablets (40 mEq total) by mouth daily. 11/06/17  Yes Georgiana Shore, NP  simvastatin (ZOCOR) 20 MG tablet Take 20 mg by mouth daily. 11/24/17  Yes [provider]  spironolactone (ALDACTONE) 25 MG tablet Take 0.5 tablets (12.5 mg total) by mouth daily. 10/30/17  Yes Shirley Friar, PA-C  torsemide (DEMADEX) 20 MG tablet Take 2 tablets (40 mg total) by mouth daily. 10/24/17  Yes Georgiana Shore, NP  triamcinolone cream (KENALOG) 0.1 % Apply 1 application topically 2 (two) times daily.   Yes [provider]  acetaminophen (TYLENOL) 325 MG tablet Take 2 tablets (650 mg total) by mouth every 6 (six) hours as needed for mild pain or moderate pain. Patient not taking: Reported on 12/12/2017 11/29/17   Augusto Gamble B, NP  insulin glargine (LANTUS) 100 UNIT/ML injection Inject 0.4 mLs (40 Units total) into the skin at bedtime. 12/18/12 01/02/13  Reyne Dumas, MD    Family History Family History  Problem Relation Age of Onset  . Diabetes Sister   . Glaucoma Sister   . Hypertension Sister   . Stroke Brother   . Heart attack Paternal Aunt     Social History Social History   Tobacco Use  . Smoking status: Former Smoker    Types: Cigarettes     Last attempt to quit: 05/22/1992    Years since quitting: 25.5  . Smokeless tobacco: Never Used  Substance Use Topics  . Alcohol use: No    Comment: quit 20 yrs ago  . Drug use: No     Allergies   Penicillins; Codeine; Iodine-131; Iohexol; Lisinopril; and Sulfa antibiotics   Review of Systems Review of Systems  Constitutional: Negative for chills and fever.  HENT: Negative for facial swelling and sore throat.   Respiratory: Negative for shortness of breath.   Cardiovascular: Negative for chest pain.  Gastrointestinal: Negative for abdominal pain, nausea and vomiting.  Genitourinary: Negative for dysuria.  Musculoskeletal: Positive for arthralgias, back pain, myalgias and neck pain.  Skin: Negative for rash and wound.  Neurological: Negative for headaches.  Psychiatric/Behavioral: The  patient is not nervous/anxious.      Physical Exam Updated Vital Signs BP 120/64 (BP Location: Left Arm)   Pulse 72   Temp 98.6 F (37 C) (Oral)   Resp 18   Ht 4\' 11"  (1.499 m)   Wt 98.4 kg (217 lb)   SpO2 95%   BMI 43.83 kg/m   Physical Exam  Constitutional: She appears well-developed and well-nourished. No distress.  HENT:  Head: Normocephalic and atraumatic.  Mouth/Throat: Oropharynx is clear and moist. No oropharyngeal exudate.  Eyes: Pupils are equal, round, and reactive to light. Conjunctivae are normal. Right eye exhibits no discharge. Left eye exhibits no discharge. No scleral icterus.  Neck: Normal range of motion. Neck supple. No thyromegaly present.  Cardiovascular: Normal rate, regular rhythm, normal heart sounds and intact distal pulses. Exam reveals no gallop and no friction rub.  No murmur heard. Pulmonary/Chest: Effort normal and breath sounds normal. No stridor. No respiratory distress. She has no wheezes. She has no rales.  Abdominal: Soft. Bowel sounds are normal. She exhibits no distension. There is no tenderness. There is no rebound and no guarding.    Musculoskeletal: She exhibits no edema.  Significant tenderness of the right hip and right thigh, as well as right knee and right ankle Mild tenderness to the left ankle Passive range of motion is painful of the right leg, but range of motion intact  Lymphadenopathy:    She has no cervical adenopathy.  Neurological: She is alert. Coordination normal.  Skin: Skin is warm and dry. No rash noted. She is not diaphoretic. No pallor.  No erythema or skin breakdown noted to the lower extremities There is a callus on the right heel  Psychiatric: She has a normal mood and affect.  Nursing note and vitals reviewed.    ED Treatments / Results  Labs (all labs ordered are listed, but only abnormal results are displayed) Labs Reviewed  BASIC METABOLIC PANEL - Abnormal; Notable for the following components:      Result Value   Chloride 97 (*)    Glucose, Bld 402 (*)    BUN 50 (*)    Creatinine, Ser 1.83 (*)    Calcium 8.7 (*)    GFR calc non Af Amer 29 (*)    GFR calc Af Amer 34 (*)    All other components within normal limits  CBC - Abnormal; Notable for the following components:   WBC 12.8 (*)    Hemoglobin 11.2 (*)    HCT 34.0 (*)    MCV 77.6 (*)    MCH 25.6 (*)    RDW 15.6 (*)    All other components within normal limits  URINALYSIS, ROUTINE W REFLEX MICROSCOPIC - Abnormal; Notable for the following components:   Color, Urine STRAW (*)    Glucose, UA 50 (*)    All other components within normal limits  TSH - Abnormal; Notable for the following components:   TSH 0.279 (*)    All other components within normal limits  CBG MONITORING, ED - Abnormal; Notable for the following components:   Glucose-Capillary 381 (*)    All other components within normal limits  HIV ANTIBODY (ROUTINE TESTING)  BASIC METABOLIC PANEL  CBC  HEMOGLOBIN A1C  I-STAT BETA HCG BLOOD, ED ([facility], [facility], [facility] ONLY)    EKG None  Radiology Dg Ankle Complete Left  Result Date: 12/11/2017 CLINICAL DATA:  Fall 2  weeks ago, recent left ankle pain EXAM: LEFT ANKLE COMPLETE - 3+ VIEW  COMPARISON:  None. FINDINGS: No fracture or dislocation is seen. The ankle mortise is intact. The base of the fifth metatarsal is unremarkable. The visualized soft tissues are unremarkable. IMPRESSION: Negative. Electronically Signed   By: Julian Hy M.D.   On: 12/11/2017 23:35   Ct Cervical Spine Wo Contrast  Result Date: 12/12/2017 CLINICAL DATA:  Neck pain after fall 2 weeks ago. EXAM: CT CERVICAL SPINE WITHOUT CONTRAST TECHNIQUE: Multidetector CT imaging of the cervical spine was performed without intravenous contrast. Multiplanar CT image reconstructions were also generated. COMPARISON:  None. FINDINGS: ALIGNMENT: Straightened lordosis.  Vertebral bodies in alignment. SKULL BASE AND VERTEBRAE: Cervical vertebral bodies and posterior elements are intact. 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 C5-6 and C6-7 auto interbody arthrodesis. C2-3 endplate irregularity, high maintained. C1-2 articulation maintained. No destructive bone lesions. SOFT TISSUES AND SPINAL CANAL: Mild prevertebral soft tissue swelling. DISC LEVELS: Moderate canal stenosis C2-3, C4-5, C5-6 and C6-7. Severe bilateral C3-4, RIGHT C4-5, LEFT C5-6 neural foraminal narrowing. UPPER CHEST: Lung apices are clear. OTHER: None. IMPRESSION: 1. No acute fracture or malalignment. 2. Mild prevertebral soft tissue swelling may be infectious or inflammatory. C2-3 endplate irregularity potentially is associated. MRI of the cervical spine with and without contrast is recommended. 3. Multilevel moderate canal stenosis and severe neural foraminal narrowing. Electronically Signed   By: Elon Alas M.D.   On: 12/12/2017 00:01   Dg Knee Complete 4 Views Right  Result Date: 12/11/2017 CLINICAL DATA:  Fall 2 weeks ago EXAM: RIGHT KNEE - COMPLETE 4+ VIEW COMPARISON:  None. FINDINGS: No fracture or dislocation is seen. The joint spaces are preserved. The visualized soft  tissues are unremarkable. No suprapatellar knee joint effusion. IMPRESSION: Negative. Electronically Signed   By: Julian Hy M.D.   On: 12/11/2017 23:34   Dg Hip Unilat W Or Wo Pelvis 2-3 Views Right  Result Date: 12/11/2017 CLINICAL DATA:  Fall 2 weeks ago, right hip pain EXAM: DG HIP (WITH OR WITHOUT PELVIS) 2-3V RIGHT COMPARISON:  None. FINDINGS: No fracture or dislocation is seen. Bilateral hip joint spaces are symmetric. Visualized bony pelvis appears intact. IMPRESSION: Negative. Electronically Signed   By: Julian Hy M.D.   On: 12/11/2017 23:34    Procedures Procedures (including critical care time)  Medications Ordered in ED Medications  loratadine (CLARITIN) tablet 10 mg (has no administration in time range)  pantoprazole (PROTONIX) EC tablet 40 mg (has no administration in time range)  fluticasone (FLONASE) 50 MCG/ACT nasal spray 1 spray (has no administration in time range)  insulin glargine (LANTUS) injection 60 Units (has no administration in time range)  insulin aspart (novoLOG) injection 10 Units (has no administration in time range)  linaclotide (LINZESS) capsule 145 mcg (has no administration in time range)  pregabalin (LYRICA) capsule 150 mg (has no administration in time range)  simvastatin (ZOCOR) tablet 20 mg (has no administration in time range)  0.9 %  sodium chloride infusion (has no administration in time range)  acetaminophen (TYLENOL) tablet 650 mg (has no administration in time range)    Or  acetaminophen (TYLENOL) suppository 650 mg (has no administration in time range)  senna-docusate (Senokot-S) tablet 1 tablet (has no administration in time range)  ondansetron (ZOFRAN) tablet 4 mg (has no administration in time range)    Or  ondansetron (ZOFRAN) injection 4 mg (has no administration in time range)  insulin aspart (novoLOG) injection 0-15 Units (has no administration in time range)  fentaNYL (SUBLIMAZE) 100 MCG/2ML injection (has no  administration in  time range)  levothyroxine (SYNTHROID, LEVOTHROID) tablet 150 mcg (has no administration in time range)  albuterol (PROVENTIL) (2.5 MG/3ML) 0.083% nebulizer solution 2.5 mg (has no administration in time range)  fentaNYL (SUBLIMAZE) injection 50 mcg (50 mcg Intravenous Given 12/12/17 0015)  sodium chloride 0.9 % bolus 1,000 mL (1,000 mLs Intravenous New Bag/Given 12/12/17 0016)  fentaNYL (SUBLIMAZE) injection 50 mcg (50 mcg Intravenous Given 12/12/17 0253)     Initial Impression / Assessment and Plan / ED Course  I have reviewed the triage vital signs and the nursing notes.  Pertinent labs & imaging results that were available during my care of the patient were reviewed by me and considered in my medical decision making (see chart for details).     Patient with right hip pain and foot pain bilaterally.  She is unable to bear weight and lives alone.  She is also blind.   x-rays of the right lower extremity are negative.  CT of the C-spine shows signs concerning for inflammatory or infectious etiology and an MRI is recommended.  MRI ordered.  Labs show mild elevation in white blood cell count, 12.8.  BMP shows elevation in BUN and creatinine, 50 and 1.83, respectively.  UA is negative for infection.  Patient given 1 L of fluids in the ED.  MRI is ordered, however will be completed during admission.  I discussed patient case with hospitalist, Dr. Renaee Munda, who will admit the patient for further evaluation.  I suspect patient needs PT/OT evaluation and rehab, as deconditioning is probably playing a large role in patient's symptoms.  I appreciate the above consultants for his assistance.  Patient also evaluated by my attending, Dr. Kathrynn Humble, who got the patient's management and agrees with plan.  Final Clinical Impressions(s) / ED Diagnoses   Final diagnoses:  Pain of right lower extremity  Weakness    ED Discharge Orders    None       Frederica Kuster, PA-C 12/12/17 [place], Ankit, MD 12/12/17 2316

## 2017-12-13 DIAGNOSIS — R531 Weakness: Secondary | ICD-10-CM

## 2017-12-13 LAB — CBC
HCT: 36.4 % (ref 36.0–46.0)
Hemoglobin: 11.8 g/dL — ABNORMAL LOW (ref 12.0–15.0)
MCH: 25.4 pg — ABNORMAL LOW (ref 26.0–34.0)
MCHC: 32.4 g/dL (ref 30.0–36.0)
MCV: 78.4 fL (ref 78.0–100.0)
Platelets: 236 10*3/uL (ref 150–400)
RBC: 4.64 MIL/uL (ref 3.87–5.11)
RDW: 15.7 % — AB (ref 11.5–15.5)
WBC: 14 10*3/uL — AB (ref 4.0–10.5)

## 2017-12-13 LAB — GLUCOSE, CAPILLARY
GLUCOSE-CAPILLARY: 141 mg/dL — AB (ref 70–99)
Glucose-Capillary: 158 mg/dL — ABNORMAL HIGH (ref 70–99)
Glucose-Capillary: 147 mg/dL — ABNORMAL HIGH (ref 70–99)
Glucose-Capillary: 150 mg/dL — ABNORMAL HIGH (ref 70–99)

## 2017-12-13 LAB — BASIC METABOLIC PANEL
Anion gap: 12 (ref 5–15)
BUN: 46 mg/dL — ABNORMAL HIGH (ref 6–20)
CHLORIDE: 101 mmol/L (ref 98–111)
CO2: 30 mmol/L (ref 22–32)
Calcium: 9.2 mg/dL (ref 8.9–10.3)
Creatinine, Ser: 1.95 mg/dL — ABNORMAL HIGH (ref 0.44–1.00)
GFR calc non Af Amer: 27 mL/min — ABNORMAL LOW (ref >60)
GFR, EST AFRICAN AMERICAN: 31 mL/min — AB (ref >60)
Glucose, Bld: 166 mg/dL — ABNORMAL HIGH (ref 70–99)
POTASSIUM: 4.2 mmol/L (ref 3.5–5.1)
Sodium: 143 mmol/L (ref 135–145)

## 2017-12-13 MED ORDER — BLOOD GLUCOSE MONITOR KIT: PACK | 0 refills | Status: DC

## 2017-12-13 MED ORDER — PREGABALIN 75 MG PO CAPS
75.0000 mg | ORAL_CAPSULE | Freq: Three times a day (TID) | ORAL | Status: DC
  Administered 2017-12-13 – 2017-12-14 (×3): 75 mg via ORAL
  Filled 2017-12-13 (×3): qty 1

## 2017-12-13 MED ORDER — TRAMADOL HCL 50 MG PO TABS: 50.0000 mg | ORAL_TABLET | Freq: Four times a day (QID) | ORAL | 0 refills | Status: AC | PRN

## 2017-12-13 MED ORDER — PREGABALIN 75 MG PO CAPS: 75.0000 mg | ORAL_CAPSULE | Freq: Three times a day (TID) | ORAL | 0 refills | Status: DC

## 2017-12-13 NOTE — Progress Notes (Addendum)
11:42am [facility] initiated insurance authorization. SNF authorization still pending.   Kathrin Greathouse, Marlinda Mike, MSW Clinical Social Worker  [phone] 12/13/2017  3:58 PM

## 2017-12-13 NOTE — Progress Notes (Signed)
PROGRESS NOTE    Amy [name]  GBT:[id number] DOB: 1961-04-11 DOA: 12/11/2017 PCP: Velna Hatchet, MD     Brief Narrative:  Amy [name] is a 57 y.o. female with medical history significant for type II DM with related nephropathy and blindness who is being admitted due to worsening right lower extremity weakness and pain. The patient lives alone with the help of local family and daytime aides. She was evaluated at [facility] last week after she fell when a home care aide moved a chair and accidentally caused her to fall. At that time, imaging was unremarkable and the patient was sent home. She says that at the time she was having bilateral foot pains, but was still able to ambulate. However over the last four days or so, her pain in her lower extremities has worsened. Two nights ago, she had to call her son at 2am to help her get back to bed from her bedside commode because she was having so much right leg and groin pain that she couldn't walk. Finally today her pain was so bad in her right hip/groin that she came to the ER for evaluation. Workup in the ED including cervical spinal and right hip imaging as well as labs have been relatively unremarkable. However, she was unable to move out of the bed at all without max assist from three people, so hospitalist was asked to consider observation admission for further workup of her right hip pain as well as her ambulatory dysfunction.  New events last 24 hours / Subjective: No acute events.  Continues to have pain and weakness in her right lower extremity.  Assessment & Plan:   Active Problems:   Blindness   Hypothyroidism   Diabetic neuropathy (HCC)   Diabetic nephropathy (HCC)   DM (diabetes mellitus), type 2, uncontrolled, with hyperosmolarity ([place])   Spinal stenosis, multilevel   Hypertension   Chronic diastolic heart failure (HCC)   Weakness   Right lower extremity weakness and pain after fall -Imaging has been negative for any acute  fractures or injuries, CT right hip revealed mild subcutaneous edema  -MRI cervical spine with no apparent acute findings  -Physical therapy evaluated patient and recommending skilled nursing facility  Leukocytosis -Unclear etiology -?Reactive to above -No fevers or complaints to indicate infectious etiology   Chronic kidney disease -Baseline creatinine fluctuates between 1.4-2.3 -Monitor   Hypothyroidism -Continue Synthroid -Recommend checking TSH, free T4 as outpatient   Diabetes type 1, uncontrolled with hyperglycemia, CKD, neuropathy -Ha1c 9.3  -Prescribed [employer] Autocode glucose meter -DM coordinator following  -Lyrica dose decreased on discharge due to decreased renal function -Continue lantus, novolog, SSI   Chronic diastolic heart failure -Does not appear fluid overloaded at this time  HLD -Continue zocor   OSA -Continue CPAP qhs    DVT prophylaxis: Lovenox Code Status: Full code Family Communication: No family at bedside Disposition Plan: Pending skilled nursing facility placement, insurance authorization   Consultants:   None  Procedures:   None  Antimicrobials:  Anti-infectives (From admission, onward)   None       Objective: Vitals:   12/12/17 2118 12/13/17 0136 12/13/17 0529 12/13/17 1345  BP:  121/77 111/72 117/65  Pulse: 75 78 78 71  Resp: 16 16 18 14   Temp:  99.3 F (37.4 C) 98.2 F (36.8 C) 98.3 F (36.8 C)  TempSrc:  Oral Oral Oral  SpO2:  100% 94% 97%  Weight:      Height:  Intake/Output Summary (Last 24 hours) at 12/13/2017 1425 Last data filed at 12/13/2017 1346 Gross per 24 hour  Intake 1915.22 ml  Output 200 ml  Net 1715.22 ml   Filed Weights   12/11/17 1949  Weight: 98.4 kg (217 lb)    Examination:  General exam: Appears calm and comfortable, blind  Respiratory system: Clear to auscultation. Respiratory effort normal. Cardiovascular system: S1 & S2 heard, RRR. No JVD, murmurs, rubs, gallops or clicks.  No pedal edema. Gastrointestinal system: Abdomen is nondistended, soft and nontender. No organomegaly or masses felt. Normal bowel sounds heard. Central nervous system: Alert and oriented. No focal neurological deficits. Extremities: Symmetric in appearance, RLE weaker compared to LLE Skin: No rashes, lesions or ulcers on exposed skin  Psychiatry: Judgement and insight appear normal. Mood & affect appropriate.   Data Reviewed: I have personally reviewed following labs and imaging studies  CBC: Recent Labs  Lab 12/07/17 1435 12/11/17 2021 12/12/17 0514 12/13/17 0541  WBC 13.7* 12.8* 11.4* 14.0*  NEUTROABS 10.2*  --   --   --   HGB 12.7 11.2* 11.8* 11.8*  HCT 40.3 34.0* 35.7* 36.4  MCV 81.1 77.6* 77.4* 78.4  PLT 195 201 198 294   Basic Metabolic Panel: Recent Labs  Lab 12/07/17 1435 12/11/17 2021 12/12/17 0514 12/13/17 0541  NA 146* 138 141 143  K 3.4* 3.5 3.3* 4.2  CL 104 97* 100 101  CO2 31 29 30 30   GLUCOSE 82 402* 313* 166*  BUN 32* 50* 44* 46*  CREATININE 1.43* 1.83* 1.51* 1.95*  CALCIUM 9.8 8.7* 8.9 9.2   GFR: Estimated Creatinine Clearance: 32.4 mL/min (A) (by C-G formula based on SCr of 1.95 mg/dL (H)). Liver Function Tests: No results for input(s): AST, ALT, ALKPHOS, BILITOT, PROT, ALBUMIN in the last 168 hours. No results for input(s): LIPASE, AMYLASE in the last 168 hours. No results for input(s): AMMONIA in the last 168 hours. Coagulation Profile: No results for input(s): INR, PROTIME in the last 168 hours. Cardiac Enzymes: No results for input(s): CKTOTAL, CKMB, CKMBINDEX, TROPONINI in the last 168 hours. BNP (last 3 results) Recent Labs    12/27/16 1135  PROBNP 83   HbA1C: Recent Labs    12/11/17 2020  HGBA1C 9.3*   CBG: Recent Labs  Lab 12/12/17 1204 12/12/17 1721 12/12/17 2143 12/13/17 0815 12/13/17 1213  GLUCAP 191* 185* 151* 158* 147*   Lipid Profile: No results for input(s): CHOL, HDL, LDLCALC, TRIG, CHOLHDL, LDLDIRECT in the  last 72 hours. Thyroid Function Tests: Recent Labs    12/11/17 2020  TSH 0.279*   Anemia Panel: No results for input(s): VITAMINB12, FOLATE, FERRITIN, TIBC, IRON, RETICCTPCT in the last 72 hours. Sepsis Labs: No results for input(s): PROCALCITON, LATICACIDVEN in the last 168 hours.  No results found for this or any previous visit (from the past 240 hour(s)).     Radiology Studies: Dg Ankle Complete Left  Result Date: 12/11/2017 CLINICAL DATA:  Fall 2 weeks ago, recent left ankle pain EXAM: LEFT ANKLE COMPLETE - 3+ VIEW COMPARISON:  None. FINDINGS: No fracture or dislocation is seen. The ankle mortise is intact. The base of the fifth metatarsal is unremarkable. The visualized soft tissues are unremarkable. IMPRESSION: Negative. Electronically Signed   By: Julian Hy M.D.   On: 12/11/2017 23:35   Ct Cervical Spine Wo Contrast  Result Date: 12/12/2017 CLINICAL DATA:  Neck pain after fall 2 weeks ago. EXAM: CT CERVICAL SPINE WITHOUT CONTRAST TECHNIQUE: Multidetector CT imaging of the  cervical spine was performed without intravenous contrast. Multiplanar CT image reconstructions were also generated. COMPARISON:  None. FINDINGS: ALIGNMENT: Straightened lordosis.  Vertebral bodies in alignment. SKULL BASE AND VERTEBRAE: Cervical vertebral bodies and posterior elements are intact. 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 C5-6 and C6-7 auto interbody arthrodesis. C2-3 endplate irregularity, high maintained. C1-2 articulation maintained. No destructive bone lesions. SOFT TISSUES AND SPINAL CANAL: Mild prevertebral soft tissue swelling. DISC LEVELS: Moderate canal stenosis C2-3, C4-5, C5-6 and C6-7. Severe bilateral C3-4, RIGHT C4-5, LEFT C5-6 neural foraminal narrowing. UPPER CHEST: Lung apices are clear. OTHER: None. IMPRESSION: 1. No acute fracture or malalignment. 2. Mild prevertebral soft tissue swelling may be infectious or inflammatory. C2-3 endplate irregularity potentially is associated.  MRI of the cervical spine with and without contrast is recommended. 3. Multilevel moderate canal stenosis and severe neural foraminal narrowing. Electronically Signed   By: Elon Alas M.D.   On: 12/12/2017 00:01   Mr Cervical Spine W Or Wo Contrast  Result Date: 12/12/2017 CLINICAL DATA:  57 year old with severe neck and lower extremity pain. Unable to ambulate. Patient fell 2 weeks ago. EXAM: MRI CERVICAL SPINE WITHOUT AND WITH CONTRAST TECHNIQUE: Multiplanar and multiecho pulse sequences of the cervical spine, to include the craniocervical junction and cervicothoracic junction, were obtained without and with intravenous contrast. CONTRAST:  27mL MULTIHANCE GADOBENATE DIMEGLUMINE 529 MG/ML IV SOLN COMPARISON:  Cervical spine CT 12/11/2017.  Brain MRI 08/06/2012. FINDINGS: Alignment: Stable straightening without significant focal angulation or listhesis. Vertebrae: No acute or suspicious osseous findings. Bulky paraspinal osteophytes consistent with diffuse idiopathic skeletal hyperostosis are again noted. In correlation with recent CT, there is interbody ankylosis at C5-6 and C6-7. Cord: The CSF surrounding the cord is effaced at multiple levels, and there is mild cord flattening, especially at C3-4 and C6-7. No definite abnormal cord signal or enhancement. Posterior Fossa, vertebral arteries, paraspinal tissues: Confluent low-density in the pons is similar to previous brain MRI. Bilateral vertebral artery flow voids. No definite paraspinal edema or abnormal enhancement. Mild prominence of the prevertebral soft tissues superiorly is attributed to normal pharyngeal mucosa. Disc levels: C2-3: Spondylosis with posterior osteophytes and asymmetric uncinate spurring on the left. No cord deformity. Mild left foraminal narrowing. C3-4: Spondylosis with posterior osteophytes and bilateral uncinate spurring. The CSF surrounding the cord is effaced with narrowing the AP diameter of the canal to 7 mm. There is  severe osseous foraminal narrowing bilaterally. C4-5: Posterior osteophytes and uncinate spurring contribute to effacement of the CSF surrounding the cord, narrowing the AP diameter of the canal to 8 mm. There is asymmetric moderate right foraminal narrowing. C5-6: Interbody ankylosis with asymmetric osteophyte formation on the left, contributing to mild left-sided cord flattening and narrowing the AP diameter of the canal to 8 mm. There is moderate left foraminal narrowing. C6-7: Interbody ankylosis with asymmetric posterior osteophyte formation on the right, contributing to mild cord flattening and narrowing the AP diameter of the canal to 6 mm. Mild foraminal narrowing bilaterally. C7-T1: No significant findings. IMPRESSION: 1. No apparent acute findings. 2. 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 Posterior osteophytes contribute to multilevel spinal stenosis and foraminal narrowing as detailed above. Mild cord flattening is present at multiple levels. No definite abnormal cord signal or enhancement. 3. Stable probable chronic small vessel ischemic changes in the pons. Electronically Signed   By: Richardean Sale M.D.   On: 12/12/2017 13:06   Ct Hip Right Wo Contrast  Result Date: 12/12/2017 CLINICAL DATA:  Right hip pain after fall 1 week ago. Progressive pain. EXAM: CT OF THE RIGHT HIP WITHOUT CONTRAST TECHNIQUE: Multidetector CT imaging of the right hip was performed according to the standard protocol. Multiplanar CT image reconstructions were also generated. COMPARISON:  Radiographs 12/11/2017 FINDINGS: Bones/Joint/Cartilage No acute fracture. Pubic rami are intact. Mild to moderate osteoarthritis with acetabular spurring. No hip joint effusion. Ligaments Suboptimally assessed by CT. Muscles and Tendons No intramuscular hematoma. Soft tissues Mild edema in the subcutaneous tissues about the lateral hip. Advanced vascular calcifications. IMPRESSION: 1. Right hip  osteoarthritis without acute fracture. 2. Mild patchy subcutaneous edema may represent soft tissue contusion. Electronically Signed   By: Jeb Levering M.D.   On: 12/12/2017 04:35   Dg Knee Complete 4 Views Right  Result Date: 12/11/2017 CLINICAL DATA:  Fall 2 weeks ago EXAM: RIGHT KNEE - COMPLETE 4+ VIEW COMPARISON:  None. FINDINGS: No fracture or dislocation is seen. The joint spaces are preserved. The visualized soft tissues are unremarkable. No suprapatellar knee joint effusion. IMPRESSION: Negative. Electronically Signed   By: Julian Hy M.D.   On: 12/11/2017 23:34   Dg Hip Unilat W Or Wo Pelvis 2-3 Views Right  Result Date: 12/11/2017 CLINICAL DATA:  Fall 2 weeks ago, right hip pain EXAM: DG HIP (WITH OR WITHOUT PELVIS) 2-3V RIGHT COMPARISON:  None. FINDINGS: No fracture or dislocation is seen. Bilateral hip joint spaces are symmetric. Visualized bony pelvis appears intact. IMPRESSION: Negative. Electronically Signed   By: Julian Hy M.D.   On: 12/11/2017 23:34      Scheduled Meds: . enoxaparin (LOVENOX) injection  40 mg Subcutaneous Q24H  . fluticasone  1 spray Each Nare Daily  . insulin aspart  0-15 Units Subcutaneous TID WC  . insulin aspart  10 Units Subcutaneous TID WC  . insulin glargine  60 Units Subcutaneous QHS  . levothyroxine  150 mcg Oral QAC breakfast  . loratadine  10 mg Oral Daily  . pantoprazole  40 mg Oral Daily  . pregabalin  75 mg Oral TID  . simvastatin  20 mg Oral Daily   Continuous Infusions:    LOS: 0 days    Time spent: 35 minutes   Dessa Phi, DO [facility] [email or web address] Password [email or web address] 12/13/2017, 2:25 PM

## 2017-12-13 NOTE — NC FL2 (Addendum)
[place] LEVEL OF CARE SCREENING TOOL     IDENTIFICATION  Patient Name: Amy [name] Birthdate: [birth year] Sex: female Admission Date (Current Location): 12/11/2017  [facility] and [place] Number:  Herbalist and Address:  [facility],  [address], [place]      Provider Number: [id number]  Attending Physician Name and Address:  Dessa Phi, DO  Relative Name and Phone Number:       Current Level of Care: Hospital Recommended Level of Care: [place] Prior Approval Number:    Date Approved/Denied:   PASRR Number:    [id number]  Discharge Plan: SNF    Current Diagnoses: Patient Active Problem List   Diagnosis Date Noted  . Weakness 12/12/2017  . Obstructive sleep apnea on CPAP 02/09/2017  . Chronic diastolic heart failure ([place]) 11/08/2016  . Near syncope 01/30/2016  . SOB (shortness of breath)   . CAP (community acquired pneumonia) 09/12/2015  . Hypoxia 09/12/2015  . Hypertension 07/05/2015  . Hypotension 07/05/2015  . Diabetes mellitus with neurological manifestations ([place]) 11/04/2014  . Hyperlipidemia LDL goal <70 11/04/2014  . Spinal stenosis, multilevel 11/04/2014  . Hyperkalemia 11/01/2014  . Hypoglycemia 08/09/2014  . DM (diabetes mellitus), type 2, uncontrolled, with hyperosmolarity ([place]) 08/08/2014  . Elevated troponin 08/08/2014  . Nausea vomiting and diarrhea 08/08/2014  . Dizziness 08/06/2012  . Orthostatic hypotension 08/06/2012  . Hypernatremia 08/06/2012  . Acute gastroenteritis 08/13/2011  . Gastroparesis 08/13/2011  . Chest pain 08/13/2011  . DM (diabetes mellitus) ([place]) 08/13/2011  . Oral thrush 08/13/2011  . Gastroenteritis 05/23/2011  . Dehydration 05/23/2011  . Blindness 05/23/2011  . DM type 1, not at goal, causing eye disease ([place]) 05/23/2011  . Asthma 05/23/2011  . Diarrhea 05/23/2011  . Vomiting 05/23/2011  . Hypothyroidism 05/23/2011  . Diabetic neuropathy  ([place]) 05/23/2011  . Diabetic nephropathy ([place]) 05/23/2011    Orientation RESPIRATION BLADDER Height & Weight     Self, Time, Situation, Place  Normal Continent Weight: 217 lb (98.4 kg) Height:  4\' 11"  (149.9 cm)  BEHAVIORAL SYMPTOMS/MOOD NEUROLOGICAL BOWEL NUTRITION STATUS      Continent Diet(Carb Modified )  AMBULATORY STATUS COMMUNICATION OF NEEDS Skin   Extensive Assist Verbally Normal                       Personal Care Assistance Level of Assistance  Bathing, Feeding, Dressing Bathing Assistance: Maximum assistance Feeding assistance: Limited assistance Dressing Assistance: Maximum assistance     Functional Limitations Info  Sight, Hearing, Speech Sight Info: Impaired(Blind ) Hearing Info: Adequate Speech Info: Adequate    SPECIAL CARE FACTORS FREQUENCY  PT (By licensed PT), OT (By licensed OT)     PT Frequency: 5x/week  OT Frequency: 5x/week             Contractures Contractures Info: Not present    Additional Factors Info  Code Status, Allergies, Insulin Sliding Scale Code Status Info: fullcode Allergies Info: Penicillins, Codeine, Iodine-131, Iohexol, Lisinopril, Sulfa Antibiotics   Insulin Sliding Scale Info: Yes, see medication list.        Current Medications (12/13/2017):  This is the current hospital active medication list Current Facility-Administered Medications  Medication Dose Route Frequency Provider Last Rate Last Dose  . 0.9 %  sodium chloride infusion   Intravenous Continuous Tomma Rakers, MD 50 mL/hr at 12/12/17 2359    . acetaminophen (TYLENOL) tablet 650 mg  650 mg Oral Q6H PRN Hollice Gong,  Mir Mohammed, MD       Or  . acetaminophen (TYLENOL) suppository 650 mg  650 mg Rectal Q6H PRN Hollice Gong, Mir Mohammed, MD      . albuterol (PROVENTIL) (2.5 MG/3ML) 0.083% nebulizer solution 2.5 mg  2.5 mg Nebulization Q4H PRN Hollice Gong, Mir Mohammed, MD      . enoxaparin (LOVENOX) injection 40 mg  40 mg Subcutaneous Q24H Patrecia Pour, Christean Grief, MD   40 mg at 12/12/17 1637  . fluticasone (FLONASE) 50 MCG/ACT nasal spray 1 spray  1 spray Each Nare Daily Tomma Rakers, MD   1 spray at 12/13/17 [phone]  . insulin aspart (novoLOG) injection 0-15 Units  0-15 Units Subcutaneous TID WC Tomma Rakers, MD   3 Units at 12/13/17 [phone]  . insulin aspart (novoLOG) injection 10 Units  10 Units Subcutaneous TID WC Tomma Rakers, MD   10 Units at 12/13/17 [phone]  . insulin glargine (LANTUS) injection 60 Units  60 Units Subcutaneous QHS Tomma Rakers, MD   60 Units at 12/12/17 2149  . levothyroxine (SYNTHROID, LEVOTHROID) tablet 150 mcg  150 mcg Oral QAC breakfast Hollice Gong, Mir Mohammed, MD   150 mcg at 12/13/17 [phone]  . linaclotide (LINZESS) capsule 145 mcg  145 mcg Oral Daily PRN Hollice Gong, Mir Mohammed, MD      . loratadine (CLARITIN) tablet 10 mg  10 mg Oral Daily Hollice Gong, Mir Mohammed, MD   10 mg at 12/13/17 0904  . methocarbamol (ROBAXIN) tablet 750 mg  750 mg Oral Q8H PRN Patrecia Pour, Christean Grief, MD      . ondansetron [facility]) tablet 4 mg  4 mg Oral Q6H PRN Hollice Gong, Mir Earlie Server, MD       Or  . ondansetron [facility]) injection 4 mg  4 mg Intravenous Q6H PRN Hollice Gong, Mir Mohammed, MD      . pantoprazole (PROTONIX) EC tablet 40 mg  40 mg Oral Daily Hollice Gong, Mir Mohammed, MD   40 mg at 12/13/17 0904  . pregabalin (LYRICA) capsule 150 mg  150 mg Oral TID Tomma Rakers, MD   150 mg at 12/13/17 0904  . senna-docusate (Senokot-S) tablet 1 tablet  1 tablet Oral QHS PRN Hollice Gong, Mir Mohammed, MD      . simvastatin (ZOCOR) tablet 20 mg  20 mg Oral Daily Hollice Gong, Mir Mohammed, MD   20 mg at 12/13/17 0904  . traMADol (ULTRAM) tablet 50 mg  50 mg Oral Q6H PRN Patrecia Pour, Christean Grief, MD   50 mg at 12/12/17 1637     Discharge Medications: Please see discharge summary for a list of discharge medications.  Relevant Imaging Results:  Relevant Lab Results:   Additional  Information SSN: [id number]  Lia Hopping, [place]

## 2017-12-13 NOTE — Progress Notes (Signed)
Pt. ready to be placed on BiPAP auto for h/s, humidifier refilled with S/W, reduced IPAP (auto pressure) down from 20 cmh20 to 12 cmh20 (auto set) due to feeling like too much, remains on room air, tolerating well, pressure felling better, made aware to notify if needed, RN aware.

## 2017-12-13 NOTE — Care Management Obs Status (Signed)
[place] NOTIFICATION   Patient Details  Name: Amy [name] MRN: [id number] Date of Birth: 1960/08/10   Medicare Observation Status Notification Given:  Yes Signed by sister after reviewed with both sister and patient.    Guadalupe Maple, RN 12/13/2017, 2:37 PM

## 2017-12-13 NOTE — Plan of Care (Signed)
  Problem: Clinical Measurements: Goal: Diagnostic test results will improve Outcome: Progressing   Problem: Clinical Measurements: Goal: Cardiovascular complication will be avoided Outcome: Progressing   Problem: Activity: Goal: Risk for activity intolerance will decrease Outcome: Progressing   Problem: Nutrition: Goal: Adequate nutrition will be maintained Outcome: Progressing

## 2017-12-13 NOTE — Progress Notes (Signed)
Plan for d/c to SNF, discharge planning per CSW. [phone] 

## 2017-12-13 NOTE — Progress Notes (Signed)
Physical Therapy Treatment Patient Details Name: Amy [name] MRN: [id number] DOB: Apr 28, 1961 Today's Date: 12/13/2017    History of Present Illness Pt arrived to ED on 7/29 with bilat foot pain and hyperglycemia. Pt had fall 2 weeks ago and sprained her R ankle, has been unable to ambulate at home as a result. Pt has history of uncontrolled DM II, blindness, peripheral neuropathy, spinal stenosis, HTN, CHF.     PT Comments    Pt with improved ambulation distance today of 5 feet, but required multimodal cuing to complete. Pt also required less physical assist for bed mobility tasks. Plan for SNF remains appropriate given pt's deficits. Will continue to follow with PT.    Follow Up Recommendations  SNF;Supervision for mobility/OOB     Equipment Recommendations  Rolling walker with 5" wheels    Recommendations for Other Services       Precautions / Restrictions Precautions Precautions: Fall Required Braces or Orthoses: Other Brace/Splint Other Brace/Splint: R ankle  Restrictions Weight Bearing Restrictions: No    Mobility  Bed Mobility Overal bed mobility: Needs Assistance Bed Mobility: Rolling;Sidelying to Sit Rolling: Min assist;Min guard Sidelying to sit: Min assist       General bed mobility comments: min guard for initiation of rolling. min assist for completion of LE rolling, initiation of trunk elevation, LE management, steadying. max verbal and tactile cuing due to blindness.   Transfers Overall transfer level: Needs assistance Equipment used: Rolling walker (2 wheeled) Transfers: Sit to/from Stand Sit to Stand: Mod assist         General transfer comment: physical assist for power up, hip extension, and hand placement on RW and bed.   Ambulation/Gait Ambulation/Gait assistance: Min assist;+2 physical assistance Gait Distance (Feet): 5 Feet Assistive device: Rolling walker (2 wheeled) Gait Pattern/deviations: Step-to pattern;Decreased step length -  right;Decreased weight shift to right;Trunk flexed;Antalgic;Decreased stance time - right Gait velocity: very decreased    General Gait Details: max verbal and tactile cuing for directions, weight shifting, and stepping. Pt with more weight shift to RLE today, but still painful. 1 episode of RLE buckling, patient self-recovered with UE support on RW and extension of R knee.    Stairs             Wheelchair Mobility    Modified Rankin (Stroke Patients Only)       Balance Overall balance assessment: Needs assistance Sitting-balance support: Bilateral upper extremity supported Sitting balance-Leahy Scale: Poor Sitting balance - Comments: Sat EOB for 1 minute with bilat UE support Postural control: Left lateral lean Standing balance support: Bilateral upper extremity supported Standing balance-Leahy Scale: Poor Standing balance comment: required constant guarding and steadying                             Cognition Arousal/Alertness: Awake/alert Behavior During Therapy: WFL for tasks assessed/performed Overall Cognitive Status: Within Functional Limits for tasks assessed                                        Exercises      General Comments        Pertinent Vitals/Pain Pain Assessment: Faces Faces Pain Scale: Hurts even more Pain Location: R ankle and entire leg  Pain Descriptors / Indicators: Grimacing;Guarding;Sore Pain Intervention(s): Limited activity within patient's tolerance;Repositioned;Monitored during session    Home Living  Prior Function            PT Goals (current goals can now be found in the care plan section) Acute Rehab PT Goals PT Goal Formulation: With patient Time For Goal Achievement: 12/19/17 Potential to Achieve Goals: Fair Progress towards PT goals: Progressing toward goals    Frequency    Min 2X/week      PT Plan Current plan remains appropriate    Co-evaluation               AM-PAC PT "6 Clicks" Daily Activity  Outcome Measure  Difficulty turning over in bed (including adjusting bedclothes, sheets and blankets)?: Unable Difficulty moving from lying on back to sitting on the side of the bed? : Unable Difficulty sitting down on and standing up from a chair with arms (e.g., wheelchair, bedside commode, etc,.)?: Unable Help needed moving to and from a bed to chair (including a wheelchair)?: A Lot Help needed walking in hospital room?: A Lot Help needed climbing 3-5 steps with a railing? : A Lot 6 Click Score: 9    End of Session Equipment Utilized During Treatment: Gait belt Activity Tolerance: Patient limited by pain;Patient limited by fatigue Patient left: in chair;with chair alarm set;with call bell/phone within reach   PT Visit Diagnosis: Other abnormalities of gait and mobility (R26.89);Muscle weakness (generalized) (M62.81)     Time: 1100-1118 PT Time Calculation (min) (ACUTE ONLY): 18 min  Charges:  $Gait Training: 8-22 mins                     Tiajuana Leppanen Conception Chancy, PT, DPT  Pager # [phone]    Rondy Krupinski D Dell Hurtubise 12/13/2017, 1:52 PM

## 2017-12-13 NOTE — Clinical Social Work Note (Signed)
Clinical Social Work Assessment  Patient Details  Name: Amy [name] MRN: [id number] Date of Birth: Apr 01, 1961  Date of referral:  12/13/17               Reason for consult:  Facility Placement                Permission sought to share information with:  Facility Sport and exercise psychologist, Family Supports Permission granted to share information::  Yes, Verbal Permission Granted  Name::        Agency::  SNF   Relationship::     Contact Information:     Housing/Transportation Living arrangements for the past 2 months:  Apartment Source of Information:  Patient Patient Interpreter Needed:  None Criminal Activity/Legal Involvement Pertinent to Current Situation/Hospitalization:  No - Comment as needed Significant Relationships:  Adult Children, Siblings, Community Support, Friend Lives with:  Self Do you feel safe going back to the place where you live?  Yes Need for family participation in patient care:  Yes (Comment)  Care giving concerns:   Leg pain, inability to ambulate. Patient blind and has low vision.  PT recommends   Social Worker assessment / plan:  Patient admitted after fall.  CSW met with patient at bedside, explained role and reason for visit- to assist with discharge to SNF. Patient agreeable to transfer to SNF for rehab. Patient reports she lives at home alone. She has a aide that comes in daily Sunday through Saturday to provide assistance with her ADL's and house chores.   She reports she can cook some meals but friends and family help prepare them as well. Patient reports she uses a walker and rolator depending on level of pain for that day.   Plan: SNF   Employment status:  Disabled (Comment on whether or not currently receiving Disability) Insurance information:  Managed Medicare PT Recommendations:  [place] / Referral to community resources:  [place]  Patient/Family's Response to care:  Agreeable and Responding  well to care.   Patient/Family's Understanding of and Emotional Response to Diagnosis, Current Treatment, and Prognosis:  Patient alert and oriented x4. Patient has a good understanding of her diagnosis and treatment. Patient reports her sister and friend [name] is supportive.   Emotional Assessment Appearance:  Appears stated age Attitude/Demeanor/Rapport:    Affect (typically observed):  Accepting, Pleasant Orientation:  Oriented to Self, Oriented to Place, Oriented to  Time, Oriented to Situation Alcohol / Substance use:  Not Applicable Psych involvement (Current and /or in the community):  No (Comment)  Discharge Needs  Concerns to be addressed:  Discharge Planning Concerns Readmission within the last 30 days:  No Current discharge risk:  Dependent with Mobility Barriers to Discharge:  Ship broker, Continued Medical Work up   [employer], LCSW 12/13/2017, 9:37 AM

## 2017-12-14 DIAGNOSIS — R531 Weakness: Secondary | ICD-10-CM | POA: Diagnosis not present

## 2017-12-14 LAB — CBC
HCT: 33.9 % — ABNORMAL LOW (ref 36.0–46.0)
Hemoglobin: 11.1 g/dL — ABNORMAL LOW (ref 12.0–15.0)
MCH: 26.1 pg (ref 26.0–34.0)
MCHC: 32.7 g/dL (ref 30.0–36.0)
MCV: 79.8 fL (ref 78.0–100.0)
PLATELETS: 228 10*3/uL (ref 150–400)
RBC: 4.25 MIL/uL (ref 3.87–5.11)
RDW: 15.6 % — ABNORMAL HIGH (ref 11.5–15.5)
WBC: 11.3 10*3/uL — ABNORMAL HIGH (ref 4.0–10.5)

## 2017-12-14 LAB — BASIC METABOLIC PANEL
Anion gap: 9 (ref 5–15)
BUN: 56 mg/dL — AB (ref 6–20)
CHLORIDE: 110 mmol/L (ref 98–111)
CO2: 31 mmol/L (ref 22–32)
CREATININE: 2.17 mg/dL — AB (ref 0.44–1.00)
Calcium: 9.2 mg/dL (ref 8.9–10.3)
GFR calc Af Amer: 28 mL/min — ABNORMAL LOW (ref >60)
GFR calc non Af Amer: 24 mL/min — ABNORMAL LOW (ref >60)
GLUCOSE: 176 mg/dL — AB (ref 70–99)
POTASSIUM: 5 mmol/L (ref 3.5–5.1)
SODIUM: 150 mmol/L — AB (ref 135–145)

## 2017-12-14 LAB — GLUCOSE, CAPILLARY
Glucose-Capillary: 140 mg/dL — ABNORMAL HIGH (ref 70–99)
Glucose-Capillary: 198 mg/dL — ABNORMAL HIGH (ref 70–99)

## 2017-12-14 MED ORDER — ENOXAPARIN SODIUM 30 MG/0.3ML ~~LOC~~ SOLN: 30.0000 mg | SUBCUTANEOUS | Status: DC

## 2017-12-14 MED ORDER — PREGABALIN 75 MG PO CAPS: 75.0000 mg | ORAL_CAPSULE | Freq: Two times a day (BID) | ORAL | Status: DC

## 2017-12-14 MED ORDER — POLYETHYLENE GLYCOL 3350 17 G PO PACK
17.0000 g | PACK | Freq: Every day | ORAL | Status: DC
  Administered 2017-12-14: 17 g via ORAL
  Filled 2017-12-14: qty 1

## 2017-12-14 NOTE — Discharge Summary (Addendum)
Physician Discharge Summary  Amy [name] EXN:[id number] DOB: 05-10-1961 DOA: 12/11/2017  PCP: Velna Hatchet, MD  Admit date: 12/11/2017 Discharge date: 12/14/2017  Admitted From: Home Disposition:  SNF  Recommendations for Outpatient Follow-up:  1. Follow up with PCP in 1 week 2. Please obtain BMP/CBC in 1 week to recheck Cr and WBC. Baseline Cr fluctuates between 1.4-2.3 on chart review.  3. Please recheck TSH, free T4 as outpatient 4-6 weeks   Discharge Condition: Stable CODE STATUS: Full  Diet recommendation: Carb modified   Brief/Interim Summary: Amy Fojtik Jonesis a 57 y.o.femalewith medical history significant fortype II DM with related nephropathy and blindness who is being admitted due to worsening right lower extremity weakness and pain. The patient lives alone with the help of local family and daytime aides. She was evaluated at [facility] last week after she fell when a home care aide moved a chair and accidentally caused her to fall. At that time, imaging was unremarkable and the patient was sent home. She says that at the time she was having bilateral foot pains, but was still able to ambulate. However over the last four days or so, her pain in her lower extremities has worsened. Two nights ago, she had to call her son at 2am to help her get back to bed from her bedside commode because she was having so much right leg and groin pain that she couldn't walk. Finally today her pain was so bad in her right hip/groin that she came to the ER for evaluation.Workup in the ED including cervical spinal and right hip imaging as well as labs have been relatively unremarkable. However, she was unable to move out of the bed at all without max assist from three people, so hospitalist was asked to consider observation admission for further workup of her right hip pain as well as her ambulatory dysfunction.  She was evaluated by physical therapy who recommended skilled nursing facility placement.   Throughout her hospitalization, she has remained stable.  Discharge Diagnoses:  Active Problems:   Blindness   Hypothyroidism   Diabetic neuropathy (HCC)   Diabetic nephropathy (HCC)   DM (diabetes mellitus), type 2, uncontrolled, with hyperosmolarity ([place])   Spinal stenosis, multilevel   Hypertension   Chronic diastolic heart failure (HCC)   Weakness  Right lower extremity weakness and pain after fall -Imaging has been negative for any acute fractures or injuries, CT right hip revealed mild subcutaneous edema  -MRI cervical spine with no apparent acute findings  -Physical therapy evaluated patient and recommending skilled nursing facility  Leukocytosis -Unclear etiology -?Reactive to above -No fevers or complaints to indicate infectious etiology -WBC trending down   Chronic kidney disease -Baseline creatinine fluctuates between 1.4-2.3 -Stable   Hypothyroidism -Continue Synthroid -Recommend checking TSH, free T4 as outpatient   Diabetes type 1, uncontrolled with hyperglycemia, CKD, neuropathy -Ha1c 9.3  -Prescribed Prodigy Autocode glucose meter -DM coordinator following  -Lyrica dose decreased on discharge due to decreased renal function -Continue lantus, novolog, SSI   Chronic diastolic heart failure -Does not appear fluid overloaded at this time  HLD -Continue zocor   OSA -Continue CPAP qhs     Discharge Instructions  Discharge Instructions    Diet Carb Modified   Complete by:  As directed    Increase activity slowly   Complete by:  As directed      Allergies as of 12/14/2017      Reactions   Penicillins Hives, Swelling   Has patient had a  PCN reaction causing immediate rash, facial/tongue/throat swelling, SOB or lightheadedness with hypotension: yes- face swelling Has patient had a PCN reaction causing severe rash involving mucus membranes or skin necrosis: no Has patient had a PCN reaction that required hospitalization unknown (childhood  allergy) Has patient had a PCN reaction occurring within the last 10 years: no If all of the above answers are "NO", then may proceed with Cephalosporin use.   Codeine Nausea Only   Iodine-131 Hives   hives   Iohexol Hives, Rash   pt developed itching and hives along with nasal congestion; needs 13 hour premeds for future studies, Onset Date: [id number]  Code: HIVES, Desc: pt developed itching and hives along with nasal congestion; needs 13 hour premeds for future studies, Onset Date: [id number]  Code: HIVES, Desc: pt developed itching and hives along with nasal congestion; needs 13 hour premeds for future studies, Onset Date: [id number]   Lisinopril Cough   Sulfa Antibiotics Nausea And Vomiting      Medication List    STOP taking these medications   acetaminophen 325 MG tablet Commonly known as:  TYLENOL     TAKE these medications   albuterol 108 (90 Base) MCG/ACT inhaler Commonly known as:  PROVENTIL HFA;VENTOLIN HFA Inhale 2 puffs into the lungs every 4 (four) hours as needed for wheezing or shortness of breath. For short   blood glucose meter kit and supplies Kit Prodigy Autocode glucose meter. Dispense based on patient and insurance preference. Use up to four times daily as directed. (FOR ICD-9 250.00, 250.01).   cetirizine 10 MG tablet Commonly known as:  ZYRTEC Take 10 mg by mouth daily.   esomeprazole 40 MG capsule Commonly known as:  NEXIUM Take 40 mg by mouth daily at 12 noon.   fluticasone 50 MCG/ACT nasal spray Commonly known as:  FLONASE Place into both nostrils daily.   HUMALOG KWIKPEN 100 UNIT/ML KiwkPen Generic drug:  insulin lispro Inject into the skin 3 (three) times daily. 12-12-17 take 8 units in morning, 10 units at lunch, 14 units at dinner   levothyroxine 150 MCG tablet Commonly known as:  SYNTHROID, LEVOTHROID Take 150 mcg by mouth daily before breakfast.   LINZESS 145 MCG Caps capsule Generic drug:  linaclotide Take 145 mcg by mouth daily as  needed (constipation).   metolazone 5 MG tablet Commonly known as:  ZAROXOLYN TAKE 1 TABLET BY MOUTH ONCE DAILY AS NEEDED. TAKE FOR WEIGHT GAIN OF 3 LBS OVERNIGHT OR 5LBS IN A WEEK   potassium chloride SA 20 MEQ tablet Commonly known as:  K-DUR,KLOR-CON Take 2 tablets (40 mEq total) by mouth daily.   pregabalin 75 MG capsule Commonly known as:  LYRICA Take 1 capsule (75 mg total) by mouth 3 (three) times daily. What changed:    medication strength  how much to take   simvastatin 20 MG tablet Commonly known as:  ZOCOR Take 20 mg by mouth daily.   spironolactone 25 MG tablet Commonly known as:  ALDACTONE Take 0.5 tablets (12.5 mg total) by mouth daily.   torsemide 20 MG tablet Commonly known as:  DEMADEX Take 2 tablets (40 mg total) by mouth daily.   traMADol 50 MG tablet Commonly known as:  ULTRAM Take 1 tablet (50 mg total) by mouth every 6 (six) hours as needed for up to 5 days for moderate pain.   TRESIBA FLEXTOUCH 200 UNIT/ML Sopn Generic drug:  Insulin Degludec Inject 60 Units into the skin at bedtime.   triamcinolone cream 0.1 %  Commonly known as:  KENALOG Apply 1 application topically 2 (two) times daily.       Contact information for follow-up providers    Velna Hatchet, MD. Schedule an appointment as soon as possible for a visit in 1 week(s).   Specialty:  Internal Medicine Contact information: [address] [place] [address] [phone]            Contact information for after-discharge care    Destination    [facility] .   Service:  Skilled Nursing Contact information: [address] [address] [phone]                 Allergies  Allergen Reactions  . Penicillins Hives and Swelling    Has patient had a PCN reaction causing immediate rash, facial/tongue/throat swelling, SOB or lightheadedness with hypotension: yes- face swelling Has patient had a PCN reaction  causing severe rash involving mucus membranes or skin necrosis: no Has patient had a PCN reaction that required hospitalization unknown (childhood allergy) Has patient had a PCN reaction occurring within the last 10 years: no If all of the above answers are "NO", then may proceed with Cephalosporin use.   . Codeine Nausea Only  . Iodine-131 Hives    hives  . Iohexol Hives and Rash    pt developed itching and hives along with nasal congestion; needs 13 hour premeds for future studies, Onset Date: [id number]   Code: HIVES, Desc: pt developed itching and hives along with nasal congestion; needs 13 hour premeds for future studies, Onset Date: [id number]  Code: HIVES, Desc: pt developed itching and hives along with nasal congestion; needs 13 hour premeds for future studies, Onset Date: [id number]   . Lisinopril Cough  . Sulfa Antibiotics Nausea And Vomiting    Procedures/Studies: Dg Cervical Spine Complete  Result Date: 12/07/2017 CLINICAL DATA:  Pt arrived from home where she fell while walking with her medical assistant. Pt states she fell backward after losing her balance, striking the back of her head. Pt c/o posterior neck pain as well as right ankle pain and swelling. Swelling and pain to the dorsal side of right ankle/ foot and around toes. Pt is a type 2 diabetic. Unknown if pt is taking blood thinners EXAM: CERVICAL SPINE - COMPLETE 4+ VIEW COMPARISON:  12/19/2015 FINDINGS: No fracture.  No spondylolisthesis. Mild to moderate loss disc height throughout the cervical spine. Anterior bridging osteophytes noted from C3 through C6. Soft tissues are unremarkable. IMPRESSION: 1. No fracture, spondylolisthesis or acute finding Electronically Signed   By: Lajean Manes M.D.   On: 12/07/2017 15:43   Dg Ankle Complete Left  Result Date: 12/11/2017 CLINICAL DATA:  Fall 2 weeks ago, recent left ankle pain EXAM: LEFT ANKLE COMPLETE - 3+ VIEW COMPARISON:  None. FINDINGS: No fracture or dislocation is seen.  The ankle mortise is intact. The base of the fifth metatarsal is unremarkable. The visualized soft tissues are unremarkable. IMPRESSION: Negative. Electronically Signed   By: Julian Hy M.D.   On: 12/11/2017 23:35   Dg Ankle Complete Right  Result Date: 12/07/2017 CLINICAL DATA:  Pt arrived from home where she fell while walking with her medical assistant. Pt states she fell backward after losing her balance, striking the back of her head. Pt c/o posterior neck pain as well as right ankle pain and swelling. Swelling and pain to the dorsal side of right ankle/ foot and around toes. Pt is a type 2 diabetic.  Unknown if pt is taking blood thinners EXAM: RIGHT ANKLE - COMPLETE 3+ VIEW COMPARISON:  11/29/2017 FINDINGS: No fracture.  No bone lesion. Ankle mortise is normally spaced and aligned. There is diffuse nonspecific soft tissue edema. IMPRESSION: No fracture or dislocation. Electronically Signed   By: Lajean Manes M.D.   On: 12/07/2017 15:44   Ct Cervical Spine Wo Contrast  Result Date: 12/12/2017 CLINICAL DATA:  Neck pain after fall 2 weeks ago. EXAM: CT CERVICAL SPINE WITHOUT CONTRAST TECHNIQUE: Multidetector CT imaging of the cervical spine was performed without intravenous contrast. Multiplanar CT image reconstructions were also generated. COMPARISON:  None. FINDINGS: ALIGNMENT: Straightened lordosis.  Vertebral bodies in alignment. SKULL BASE AND VERTEBRAE: Cervical vertebral bodies and posterior elements are intact. 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 C5-6 and C6-7 auto interbody arthrodesis. C2-3 endplate irregularity, high maintained. C1-2 articulation maintained. No destructive bone lesions. SOFT TISSUES AND SPINAL CANAL: Mild prevertebral soft tissue swelling. DISC LEVELS: Moderate canal stenosis C2-3, C4-5, C5-6 and C6-7. Severe bilateral C3-4, RIGHT C4-5, LEFT C5-6 neural foraminal narrowing. UPPER CHEST: Lung apices are clear. OTHER: None. IMPRESSION: 1. No acute fracture or  malalignment. 2. Mild prevertebral soft tissue swelling may be infectious or inflammatory. C2-3 endplate irregularity potentially is associated. MRI of the cervical spine with and without contrast is recommended. 3. Multilevel moderate canal stenosis and severe neural foraminal narrowing. Electronically Signed   By: Elon Alas M.D.   On: 12/12/2017 00:01   Mr Cervical Spine W Or Wo Contrast  Result Date: 12/12/2017 CLINICAL DATA:  57 year old with severe neck and lower extremity pain. Unable to ambulate. Patient fell 2 weeks ago. EXAM: MRI CERVICAL SPINE WITHOUT AND WITH CONTRAST TECHNIQUE: Multiplanar and multiecho pulse sequences of the cervical spine, to include the craniocervical junction and cervicothoracic junction, were obtained without and with intravenous contrast. CONTRAST:  52m MULTIHANCE GADOBENATE DIMEGLUMINE 529 MG/ML IV SOLN COMPARISON:  Cervical spine CT 12/11/2017.  Brain MRI 08/06/2012. FINDINGS: Alignment: Stable straightening without significant focal angulation or listhesis. Vertebrae: No acute or suspicious osseous findings. Bulky paraspinal osteophytes consistent with diffuse idiopathic skeletal hyperostosis are again noted. In correlation with recent CT, there is interbody ankylosis at C5-6 and C6-7. Cord: The CSF surrounding the cord is effaced at multiple levels, and there is mild cord flattening, especially at C3-4 and C6-7. No definite abnormal cord signal or enhancement. Posterior Fossa, vertebral arteries, paraspinal tissues: Confluent low-density in the pons is similar to previous brain MRI. Bilateral vertebral artery flow voids. No definite paraspinal edema or abnormal enhancement. Mild prominence of the prevertebral soft tissues superiorly is attributed to normal pharyngeal mucosa. Disc levels: C2-3: Spondylosis with posterior osteophytes and asymmetric uncinate spurring on the left. No cord deformity. Mild left foraminal narrowing. C3-4: Spondylosis with posterior  osteophytes and bilateral uncinate spurring. The CSF surrounding the cord is effaced with narrowing the AP diameter of the canal to 7 mm. There is severe osseous foraminal narrowing bilaterally. C4-5: Posterior osteophytes and uncinate spurring contribute to effacement of the CSF surrounding the cord, narrowing the AP diameter of the canal to 8 mm. There is asymmetric moderate right foraminal narrowing. C5-6: Interbody ankylosis with asymmetric osteophyte formation on the left, contributing to mild left-sided cord flattening and narrowing the AP diameter of the canal to 8 mm. There is moderate left foraminal narrowing. C6-7: Interbody ankylosis with asymmetric posterior osteophyte formation on the right, contributing to mild cord flattening and narrowing the AP diameter of the canal to 6 mm. Mild foraminal narrowing  bilaterally. C7-T1: No significant findings. IMPRESSION: 1. No apparent acute findings. 2. 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 Posterior osteophytes contribute to multilevel spinal stenosis and foraminal narrowing as detailed above. Mild cord flattening is present at multiple levels. No definite abnormal cord signal or enhancement. 3. Stable probable chronic small vessel ischemic changes in the pons. Electronically Signed   By: Richardean Sale M.D.   On: 12/12/2017 13:06   Ct Hip Right Wo Contrast  Result Date: 12/12/2017 CLINICAL DATA:  Right hip pain after fall 1 week ago. Progressive pain. EXAM: CT OF THE RIGHT HIP WITHOUT CONTRAST TECHNIQUE: Multidetector CT imaging of the right hip was performed according to the standard protocol. Multiplanar CT image reconstructions were also generated. COMPARISON:  Radiographs 12/11/2017 FINDINGS: Bones/Joint/Cartilage No acute fracture. Pubic rami are intact. Mild to moderate osteoarthritis with acetabular spurring. No hip joint effusion. Ligaments Suboptimally assessed by CT. Muscles and Tendons No  intramuscular hematoma. Soft tissues Mild edema in the subcutaneous tissues about the lateral hip. Advanced vascular calcifications. IMPRESSION: 1. Right hip osteoarthritis without acute fracture. 2. Mild patchy subcutaneous edema may represent soft tissue contusion. Electronically Signed   By: Jeb Levering M.D.   On: 12/12/2017 04:35   Dg Knee Complete 4 Views Right  Result Date: 12/11/2017 CLINICAL DATA:  Fall 2 weeks ago EXAM: RIGHT KNEE - COMPLETE 4+ VIEW COMPARISON:  None. FINDINGS: No fracture or dislocation is seen. The joint spaces are preserved. The visualized soft tissues are unremarkable. No suprapatellar knee joint effusion. IMPRESSION: Negative. Electronically Signed   By: Julian Hy M.D.   On: 12/11/2017 23:34   Dg Foot Complete Right  Result Date: 11/29/2017 CLINICAL DATA:  RIGHT foot pain at great toe, heel pain, redness and swelling great toe, no known injury EXAM: RIGHT FOOT COMPLETE - 3+ VIEW COMPARISON:  None FINDINGS: Osseous mineralization normal. Joint spaces preserved. No acute fracture, dislocation, or bone destruction. Scattered soft tissue swelling at dorsum of RIGHT foot. No erosive or inflammatory osseous changes. IMPRESSION: No acute osseous abnormalities. Electronically Signed   By: Lavonia Dana M.D.   On: 11/29/2017 13:29   Dg Hip Unilat W Or Wo Pelvis 2-3 Views Right  Result Date: 12/11/2017 CLINICAL DATA:  Fall 2 weeks ago, right hip pain EXAM: DG HIP (WITH OR WITHOUT PELVIS) 2-3V RIGHT COMPARISON:  None. FINDINGS: No fracture or dislocation is seen. Bilateral hip joint spaces are symmetric. Visualized bony pelvis appears intact. IMPRESSION: Negative. Electronically Signed   By: Julian Hy M.D.   On: 12/11/2017 23:34       Discharge Exam: Vitals:   12/13/17 2035 12/14/17 0518  BP: (!) 145/72 128/67  Pulse: 79 71  Resp: 16 16  Temp: 98.5 F (36.9 C) 98.6 F (37 C)  SpO2: 100% 95%     General: Pt is alert, awake, not in acute  distress Cardiovascular: RRR, S1/S2 +, no rubs, no gallops Respiratory: CTA bilaterally, no wheezing, no rhonchi Abdominal: Soft, NT, ND, bowel sounds + Extremities: no edema, no cyanosis    The results of significant diagnostics from this hospitalization (including imaging, microbiology, ancillary and laboratory) are listed below for reference.     Microbiology: No results found for this or any previous visit (from the past 240 hour(s)).   Labs: BNP (last 3 results) Recent Labs    02/04/17 0243 07/13/17 1147  BNP 30.0 50.3   Basic Metabolic Panel: Recent Labs  Lab 12/07/17 1435 12/11/17 2021 12/12/17 0514 12/13/17  0541 12/14/17 0539  NA 146* 138 141 143 150*  K 3.4* 3.5 3.3* 4.2 5.0  CL 104 97* 100 101 110  CO2 '31 29 30 30 31  ' GLUCOSE 82 402* 313* 166* 176*  BUN 32* 50* 44* 46* 56*  CREATININE 1.43* 1.83* 1.51* 1.95* 2.17*  CALCIUM 9.8 8.7* 8.9 9.2 9.2   Liver Function Tests: No results for input(s): AST, ALT, ALKPHOS, BILITOT, PROT, ALBUMIN in the last 168 hours. No results for input(s): LIPASE, AMYLASE in the last 168 hours. No results for input(s): AMMONIA in the last 168 hours. CBC: Recent Labs  Lab 12/07/17 1435 12/11/17 2021 12/12/17 0514 12/13/17 0541 12/14/17 0539  WBC 13.7* 12.8* 11.4* 14.0* 11.3*  NEUTROABS 10.2*  --   --   --   --   HGB 12.7 11.2* 11.8* 11.8* 11.1*  HCT 40.3 34.0* 35.7* 36.4 33.9*  MCV 81.1 77.6* 77.4* 78.4 79.8  PLT 195 201 198 236 228   Cardiac Enzymes: No results for input(s): CKTOTAL, CKMB, CKMBINDEX, TROPONINI in the last 168 hours. BNP: Invalid input(s): POCBNP CBG: Recent Labs  Lab 12/13/17 0815 12/13/17 1213 12/13/17 1647 12/13/17 2257 12/14/17 0723  GLUCAP 158* 147* 150* 141* 140*   D-Dimer No results for input(s): DDIMER in the last 72 hours. Hgb A1c Recent Labs    12/11/17 2020  HGBA1C 9.3*   Lipid Profile No results for input(s): CHOL, HDL, LDLCALC, TRIG, CHOLHDL, LDLDIRECT in the last 72  hours. Thyroid function studies Recent Labs    12/11/17 2020  TSH 0.279*   Anemia work up No results for input(s): VITAMINB12, FOLATE, FERRITIN, TIBC, IRON, RETICCTPCT in the last 72 hours. Urinalysis    Component Value Date/Time   COLORURINE STRAW (A) 12/11/2017 2312   APPEARANCEUR CLEAR 12/11/2017 2312   LABSPEC 1.008 12/11/2017 2312   PHURINE 5.0 12/11/2017 2312   GLUCOSEU 50 (A) 12/11/2017 2312   HGBUR NEGATIVE 12/11/2017 2312   BILIRUBINUR NEGATIVE 12/11/2017 2312   KETONESUR NEGATIVE 12/11/2017 2312   PROTEINUR NEGATIVE 12/11/2017 2312   UROBILINOGEN 0.2 01/16/2015 1121   NITRITE NEGATIVE 12/11/2017 2312   LEUKOCYTESUR NEGATIVE 12/11/2017 2312   Sepsis Labs Invalid input(s): PROCALCITONIN,  WBC,  LACTICIDVEN Microbiology No results found for this or any previous visit (from the past 240 hour(s)).   Patient was seen and examined on the day of discharge and was found to be in stable condition. Time coordinating discharge: 35 minutes including assessment and coordination of care, as well as examination of the patient.   SIGNED:  Dessa Phi, DO [facility] Pager [phone]  If 7PM-7AM, please contact night-coverage [email or web address] Password [facility] 12/14/2017, 12:07 PM

## 2017-12-14 NOTE — Clinical Social Work Placement (Addendum)
Insurance authorization received.  Nurse given number to call report.  PTAR arranged for 2:00 pick up.    CLINICAL SOCIAL WORK PLACEMENT  NOTE  Date:  12/14/2017  Patient Details  Name: Amy [name] MRN: [id number] Date of Birth: July 23, 1960  Clinical Social Work is seeking post-discharge placement for this patient at the [place] level of care (*CSW will initial, date and re-position this form in  chart as items are completed):  Yes   Patient/family provided with [facility] Work Department's list of facilities offering this level of care within the geographic area requested by the patient (or if unable, by the patient's family).  Yes   Patient/family informed of their freedom to choose among providers that offer the needed level of care, that participate in Medicare, Medicaid or managed care program needed by the patient, have an available bed and are willing to accept the patient.  Yes   Patient/family informed of [facility]'s ownership interest in [facility] and [facility], as well as of the fact that they are under no obligation to receive care at these facilities.  PASRR submitted to EDS on       PASRR number received on       Existing PASRR number confirmed on 12/13/17     FL2 transmitted to all facilities in geographic area requested by pt/family on       FL2 transmitted to all facilities within larger geographic area on 12/14/17     Patient informed that his/her managed care company has contracts with or will negotiate with certain facilities, including the following:        Yes   Patient/family informed of bed offers received.  Patient chooses bed at [facility] recommends and patient chooses bed at      Patient to be transferred to [facility] on 12/14/17.  Patient to be transferred to facility by PTAR     Patient family notified on 12/14/17 of transfer.  Name of family  member notified:  Patient to notify her sister and friend.    PHYSICIAN       Additional Comment:   Patient reports sister will bring CPAP from home.    _______________________________________________ Lia Hopping, LCSW 12/14/2017, 12:01 PM

## 2017-12-14 NOTE — Progress Notes (Signed)
Called report to [place], the RN receiving patient at [facility]. Mrs.Rubendall will be transferred to [facility] room 219 by PTAR. PTAR is scheduled for 2:00, waiting on arrival.

## 2017-12-14 NOTE — Progress Notes (Signed)
Pharmacy Note:  Enoxaparin 40 mg subcutaneously daily changed to 30 mg subQ daily dose for CrCl < 30 mL/min. Per protocol.  Lenis Noon, PharmD 12/14/17 2:51 PM

## 2017-12-15 ENCOUNTER — Encounter: Payer: Self-pay | Admitting: Adult Health

## 2017-12-15 ENCOUNTER — Ambulatory Visit: Payer: Medicare Other | Admitting: Podiatry

## 2017-12-15 ENCOUNTER — Non-Acute Institutional Stay (SKILLED_NURSING_FACILITY): Payer: Medicare Other | Admitting: Adult Health

## 2017-12-15 DIAGNOSIS — M79604 Pain in right leg: Secondary | ICD-10-CM

## 2017-12-15 DIAGNOSIS — E039 Hypothyroidism, unspecified: Secondary | ICD-10-CM

## 2017-12-15 DIAGNOSIS — E0840 Diabetes mellitus due to underlying condition with diabetic neuropathy, unspecified: Secondary | ICD-10-CM

## 2017-12-15 DIAGNOSIS — E785 Hyperlipidemia, unspecified: Secondary | ICD-10-CM

## 2017-12-15 DIAGNOSIS — K5909 Other constipation: Secondary | ICD-10-CM

## 2017-12-15 DIAGNOSIS — I5032 Chronic diastolic (congestive) heart failure: Secondary | ICD-10-CM

## 2017-12-15 NOTE — Progress Notes (Signed)
Location:  [place] Room Number: 219 A Place of Service:  SNF (31) Provider:  Durenda Age, NP  Patient Care Team: Velna Hatchet, MD as PCP - General (Internal Medicine)  Extended Emergency Contact Information Primary Emergency Contact: Ronnette Hila, [place] [address] [place] Phone: [phone] Mobile Phone: [phone] Relation: Sister Secondary Emergency Contact: Cobb,Elaine          [place], [place] Phone: [phone] Relation: Friend  Code Status:  Full Code  Goals of care: Advanced Directive information Advanced Directives 12/15/2017  Does Patient Have a Medical Advance Directive? No  Would patient like information on creating a medical advance directive? -  Pre-existing out of facility DNR order (yellow form or pink MOST form) -     Chief Complaint  Patient presents with  . Acute Visit    Hospital follow up    HPI:  Pt is a 57 y.o. 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 She has been admitted to [facility] on 12/14/17 from the hospital for worsening right lower extremity weakness and pain. She lives alone with the help of a family member and daytime aides. She was evaluated at [facility] last week after a fall wherein the home care aide moved a chair which caused her to fall. Imaging was negative for fracture and was sent ome. She was then able to ambulate even with bilateral foot pains. However, she started having worsening pain on her lower extremities. She was unable to get off her bedside commode to get back to bed, one night, which necessitated her calling for help from her son @ 2 am. She was having worsening right hip/groin pain so she went back to ER for evaluation. Work up in the ED was unremarkable. She was unable to move out of bed without maxi assist from three people. She was evaluated by  physical therapist who recommended SNF placement.   She was seen in her room today. She complained of constipation. She verbalized that her right foot pain is 2/10.   Past Medical History:  Diagnosis Date  . Arthritis   . Asthma   . Blind   . Blindness and low vision    left eye glass eye,  legally blind in right eye  . Diabetes mellitus   . Diabetic neuropathy ([place])   . Hyperlipidemia   . Hypertension   . Hypothyroidism   . Spinal stenosis    Past Surgical History:  Procedure Laterality Date  . ABDOMINAL HYSTERECTOMY  1990  . [place]   x 2  . CHOLECYSTECTOMY  2008  . ENUCLEATION Bilateral 09/15/1998  . EYE SURGERY  2016   fitting artificial eye    Allergies  Allergen Reactions  . Penicillins Hives and Swelling    Has patient had a PCN reaction causing immediate rash, facial/tongue/throat swelling, SOB or lightheadedness with hypotension: yes- face swelling Has patient had a PCN reaction causing severe rash involving mucus membranes or skin necrosis: no Has patient had a PCN reaction that required hospitalization unknown (childhood allergy) Has patient had a PCN reaction occurring within the last 10 years: no If all of the above answers are "NO", then may proceed with Cephalosporin use.   . Codeine Nausea Only  . Iodine-131 Hives    hives  . Iohexol Hives and Rash  pt developed itching and hives along with nasal congestion; needs 13 hour premeds for future studies, Onset Date: [id number]   Code: HIVES, Desc: pt developed itching and hives along with nasal congestion; needs 13 hour premeds for future studies, Onset Date: [id number]  Code: HIVES, Desc: pt developed itching and hives along with nasal congestion; needs 13 hour premeds for future studies, Onset Date: [id number]   . Lisinopril Cough  . Sulfa Antibiotics Nausea And Vomiting    Outpatient Encounter Medications as of 12/15/2017  Medication Sig  . albuterol (PROVENTIL HFA;VENTOLIN HFA) 108  (90 BASE) MCG/ACT inhaler Inhale 2 puffs into the lungs every 4 (four) hours as needed for wheezing or shortness of breath. For short  . blood glucose meter kit and supplies KIT [employer] Autocode glucose meter. Dispense based on patient and insurance preference. Use up to four times daily as directed. (FOR ICD-9 250.00, 250.01).  . cetirizine (ZYRTEC) 10 MG tablet Take 10 mg by mouth daily.  Marland Kitchen esomeprazole (NEXIUM) 40 MG capsule Take 40 mg by mouth daily at 12 noon.   . fluticasone (FLONASE) 50 MCG/ACT nasal spray Place into both nostrils daily.  Marland Kitchen HUMALOG KWIKPEN 100 UNIT/ML KiwkPen Inject into the skin 3 (three) times daily. 12-12-17 take 8 units in morning, 10 units at lunch, 14 units at dinner  . Insulin Degludec (TRESIBA FLEXTOUCH) 200 UNIT/ML SOPN Inject 60 Units into the skin at bedtime.   Marland Kitchen levothyroxine (SYNTHROID, LEVOTHROID) 150 MCG tablet Take 150 mcg by mouth daily before breakfast.  . Linaclotide (LINZESS) 145 MCG CAPS capsule Take 145 mcg by mouth daily as needed (constipation).   . metolazone (ZAROXOLYN) 5 MG tablet TAKE 1 TABLET BY MOUTH ONCE DAILY AS NEEDED. TAKE FOR WEIGHT GAIN OF 3 LBS OVERNIGHT OR 5LBS IN A WEEK  . potassium chloride SA (K-DUR,KLOR-CON) 20 MEQ tablet Take 2 tablets (40 mEq total) by mouth daily.  . pregabalin (LYRICA) 75 MG capsule Take 1 capsule (75 mg total) by mouth 3 (three) times daily.  . simvastatin (ZOCOR) 20 MG tablet Take 20 mg by mouth daily.  Marland Kitchen spironolactone (ALDACTONE) 25 MG tablet Take 0.5 tablets (12.5 mg total) by mouth daily.  Marland Kitchen torsemide (DEMADEX) 20 MG tablet Take 2 tablets (40 mg total) by mouth daily.  . traMADol (ULTRAM) 50 MG tablet Take 1 tablet (50 mg total) by mouth every 6 (six) hours as needed for up to 5 days for moderate pain.  Marland Kitchen triamcinolone cream (KENALOG) 0.1 % Apply 1 application topically 2 (two) times daily.  . [DISCONTINUED] insulin glargine (LANTUS) 100 UNIT/ML injection Inject 0.4 mLs (40 Units total) into the skin at  bedtime.   No facility-administered encounter medications on file as of 12/15/2017.     Review of Systems  GENERAL: No change in appetite, no fatigue, no weight changes, no fever, chills or weakness MOUTH and THROAT: Denies oral discomfort, gingival pain or bleeding RESPIRATORY: no cough, SOB, DOE, wheezing, hemoptysis CARDIAC: No chest pain, edema or palpitations GI: complained of constipation PSYCHIATRIC: Denies feelings of depression or anxiety. No report of hallucinations, insomnia, paranoia, or agitation   Immunization History  Administered Date(s) Administered  . Influenza,inj,Quad PF,6+ Mos 03/04/2013, 01/31/2016  . Pneumococcal Polysaccharide-23 01/31/2016   Pertinent  Health Maintenance Due  Topic Date Due  . FOOT EXAM  04/01/1969  . OPHTHALMOLOGY EXAM  04/01/1969  . PAP SMEAR  04/01/1980  . COLONOSCOPY  04/01/2009  . URINE MICROALBUMIN  10/30/2013  . INFLUENZA VACCINE  12/14/2017  . HEMOGLOBIN  A1C  06/13/2018  . MAMMOGRAM  11/24/2018   Fall Risk  02/09/2017 01/25/2016  Falls in the past year? No No     Vitals:   12/15/17 0943  BP: (!) 106/57  Pulse: 75  Resp: 16  Temp: (!) 97 F (36.1 C)  SpO2: 96%  Weight: 217 lb (98.4 kg)  Height: '4\' 11"'  (1.499 m)   Body mass index is 43.83 kg/m.  Physical Exam  GENERAL APPEARANCE: Well nourished. In no acute distress. Morbidly obese SKIN:  Skin is warm and dry.  MOUTH and THROAT: Lips are without lesions. Oral mucosa is moist and without lesions. Tongue is normal in shape, size, and color and without lesions RESPIRATORY: Breathing is even & unlabored, BS CTAB CARDIAC: RRR, no murmur,no extra heart sounds, RLE2+ edema GI: Abdomen soft, normal BS, no masses, no tenderness EXTREMITIES:  Able to move X 4 extremities, right foot with brace NEUROLOGICAL: There is no tremor. Speech is clear PSYCHIATRIC: Alert and oriented X 3. Affect and behavior are appropriate  Labs reviewed: Recent Labs    12/12/17 0514  12/13/17 0541 12/14/17 0539  NA 141 143 150*  K 3.3* 4.2 5.0  CL 100 101 110  CO2 '30 30 31  ' GLUCOSE 313* 166* 176*  BUN 44* 46* 56*  CREATININE 1.51* 1.95* 2.17*  CALCIUM 8.9 9.2 9.2   Recent Labs    02/04/17 0243  AST 15  ALT 18  ALKPHOS 114  BILITOT 0.5  PROT 7.0  ALBUMIN 3.0*   Recent Labs    12/07/17 1435  12/12/17 0514 12/13/17 0541 12/14/17 0539  WBC 13.7*   < > 11.4* 14.0* 11.3*  NEUTROABS 10.2*  --   --   --   --   HGB 12.7   < > 11.8* 11.8* 11.1*  HCT 40.3   < > 35.7* 36.4 33.9*  MCV 81.1   < > 77.4* 78.4 79.8  PLT 195   < > 198 236 228   < > = values in this interval not displayed.   Lab Results  Component Value Date   TSH 0.279 (L) 12/11/2017   Lab Results  Component Value Date   HGBA1C 9.3 (H) 12/11/2017   Lab Results  Component Value Date   CHOL 170 10/22/2012   HDL 56 10/22/2012   LDLCALC 92 10/22/2012   TRIG 112 10/22/2012   CHOLHDL 3.0 10/22/2012    Significant Diagnostic Results in last 30 days:  Dg Cervical Spine Complete  Result Date: 12/07/2017 CLINICAL DATA:  Pt arrived from home where she fell while walking with her medical assistant. Pt states she fell backward after losing her balance, striking the back of her head. Pt c/o posterior neck pain as well as right ankle pain and swelling. Swelling and pain to the dorsal side of right ankle/ foot and around toes. Pt is a type 2 diabetic. Unknown if pt is taking blood thinners EXAM: CERVICAL SPINE - COMPLETE 4+ VIEW COMPARISON:  12/19/2015 FINDINGS: No fracture.  No spondylolisthesis. Mild to moderate loss disc height throughout the cervical spine. Anterior bridging osteophytes noted from C3 through C6. Soft tissues are unremarkable. IMPRESSION: 1. No fracture, spondylolisthesis or acute finding Electronically Signed   By: Lajean Manes M.D.   On: 12/07/2017 15:43   Dg Ankle Complete Left  Result Date: 12/11/2017 CLINICAL DATA:  Fall 2 weeks ago, recent left ankle pain EXAM: LEFT ANKLE  COMPLETE - 3+ VIEW COMPARISON:  None. FINDINGS: No fracture or dislocation is seen.  The ankle mortise is intact. The base of the fifth metatarsal is unremarkable. The visualized soft tissues are unremarkable. IMPRESSION: Negative. Electronically Signed   By: Julian Hy M.D.   On: 12/11/2017 23:35   Dg Ankle Complete Right  Result Date: 12/07/2017 CLINICAL DATA:  Pt arrived from home where she fell while walking with her medical assistant. Pt states she fell backward after losing her balance, striking the back of her head. Pt c/o posterior neck pain as well as right ankle pain and swelling. Swelling and pain to the dorsal side of right ankle/ foot and around toes. Pt is a type 2 diabetic. Unknown if pt is taking blood thinners EXAM: RIGHT ANKLE - COMPLETE 3+ VIEW COMPARISON:  11/29/2017 FINDINGS: No fracture.  No bone lesion. Ankle mortise is normally spaced and aligned. There is diffuse nonspecific soft tissue edema. IMPRESSION: No fracture or dislocation. Electronically Signed   By: Lajean Manes M.D.   On: 12/07/2017 15:44   Ct Cervical Spine Wo Contrast  Result Date: 12/12/2017 CLINICAL DATA:  Neck pain after fall 2 weeks ago. EXAM: CT CERVICAL SPINE WITHOUT CONTRAST TECHNIQUE: Multidetector CT imaging of the cervical spine was performed without intravenous contrast. Multiplanar CT image reconstructions were also generated. COMPARISON:  None. FINDINGS: ALIGNMENT: Straightened lordosis.  Vertebral bodies in alignment. SKULL BASE AND VERTEBRAE: Cervical vertebral bodies and posterior elements are intact. 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 C5-6 and C6-7 auto interbody arthrodesis. C2-3 endplate irregularity, high maintained. C1-2 articulation maintained. No destructive bone lesions. SOFT TISSUES AND SPINAL CANAL: Mild prevertebral soft tissue swelling. DISC LEVELS: Moderate canal stenosis C2-3, C4-5, C5-6 and C6-7. Severe bilateral C3-4, RIGHT C4-5, LEFT C5-6 neural foraminal narrowing. UPPER  CHEST: Lung apices are clear. OTHER: None. IMPRESSION: 1. No acute fracture or malalignment. 2. Mild prevertebral soft tissue swelling may be infectious or inflammatory. C2-3 endplate irregularity potentially is associated. MRI of the cervical spine with and without contrast is recommended. 3. Multilevel moderate canal stenosis and severe neural foraminal narrowing. Electronically Signed   By: Elon Alas M.D.   On: 12/12/2017 00:01   Mr Cervical Spine W Or Wo Contrast  Result Date: 12/12/2017 CLINICAL DATA:  57 year old with severe neck and lower extremity pain. Unable to ambulate. Patient fell 2 weeks ago. EXAM: MRI CERVICAL SPINE WITHOUT AND WITH CONTRAST TECHNIQUE: Multiplanar and multiecho pulse sequences of the cervical spine, to include the craniocervical junction and cervicothoracic junction, were obtained without and with intravenous contrast. CONTRAST:  34m MULTIHANCE GADOBENATE DIMEGLUMINE 529 MG/ML IV SOLN COMPARISON:  Cervical spine CT 12/11/2017.  Brain MRI 08/06/2012. FINDINGS: Alignment: Stable straightening without significant focal angulation or listhesis. Vertebrae: No acute or suspicious osseous findings. Bulky paraspinal osteophytes consistent with diffuse idiopathic skeletal hyperostosis are again noted. In correlation with recent CT, there is interbody ankylosis at C5-6 and C6-7. Cord: The CSF surrounding the cord is effaced at multiple levels, and there is mild cord flattening, especially at C3-4 and C6-7. No definite abnormal cord signal or enhancement. Posterior Fossa, vertebral arteries, paraspinal tissues: Confluent low-density in the pons is similar to previous brain MRI. Bilateral vertebral artery flow voids. No definite paraspinal edema or abnormal enhancement. Mild prominence of the prevertebral soft tissues superiorly is attributed to normal pharyngeal mucosa. Disc levels: C2-3: Spondylosis with posterior osteophytes and asymmetric uncinate spurring on the left. No cord  deformity. Mild left foraminal narrowing. C3-4: Spondylosis with posterior osteophytes and bilateral uncinate spurring. The CSF surrounding the cord is effaced with narrowing the AP  diameter of the canal to 7 mm. There is severe osseous foraminal narrowing bilaterally. C4-5: Posterior osteophytes and uncinate spurring contribute to effacement of the CSF surrounding the cord, narrowing the AP diameter of the canal to 8 mm. There is asymmetric moderate right foraminal narrowing. C5-6: Interbody ankylosis with asymmetric osteophyte formation on the left, contributing to mild left-sided cord flattening and narrowing the AP diameter of the canal to 8 mm. There is moderate left foraminal narrowing. C6-7: Interbody ankylosis with asymmetric posterior osteophyte formation on the right, contributing to mild cord flattening and narrowing the AP diameter of the canal to 6 mm. Mild foraminal narrowing bilaterally. C7-T1: No significant findings. IMPRESSION: 1. No apparent acute findings. 2. 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 Posterior osteophytes contribute to multilevel spinal stenosis and foraminal narrowing as detailed above. Mild cord flattening is present at multiple levels. No definite abnormal cord signal or enhancement. 3. Stable probable chronic small vessel ischemic changes in the pons. Electronically Signed   By: Richardean Sale M.D.   On: 12/12/2017 13:06   Ct Hip Right Wo Contrast  Result Date: 12/12/2017 CLINICAL DATA:  Right hip pain after fall 1 week ago. Progressive pain. EXAM: CT OF THE RIGHT HIP WITHOUT CONTRAST TECHNIQUE: Multidetector CT imaging of the right hip was performed according to the standard protocol. Multiplanar CT image reconstructions were also generated. COMPARISON:  Radiographs 12/11/2017 FINDINGS: Bones/Joint/Cartilage No acute fracture. Pubic rami are intact. Mild to moderate osteoarthritis with acetabular spurring. No hip joint effusion.  Ligaments Suboptimally assessed by CT. Muscles and Tendons No intramuscular hematoma. Soft tissues Mild edema in the subcutaneous tissues about the lateral hip. Advanced vascular calcifications. IMPRESSION: 1. Right hip osteoarthritis without acute fracture. 2. Mild patchy subcutaneous edema may represent soft tissue contusion. Electronically Signed   By: Jeb Levering M.D.   On: 12/12/2017 04:35   Dg Knee Complete 4 Views Right  Result Date: 12/11/2017 CLINICAL DATA:  Fall 2 weeks ago EXAM: RIGHT KNEE - COMPLETE 4+ VIEW COMPARISON:  None. FINDINGS: No fracture or dislocation is seen. The joint spaces are preserved. The visualized soft tissues are unremarkable. No suprapatellar knee joint effusion. IMPRESSION: Negative. Electronically Signed   By: Julian Hy M.D.   On: 12/11/2017 23:34   Dg Foot Complete Right  Result Date: 11/29/2017 CLINICAL DATA:  RIGHT foot pain at great toe, heel pain, redness and swelling great toe, no known injury EXAM: RIGHT FOOT COMPLETE - 3+ VIEW COMPARISON:  None FINDINGS: Osseous mineralization normal. Joint spaces preserved. No acute fracture, dislocation, or bone destruction. Scattered soft tissue swelling at dorsum of RIGHT foot. No erosive or inflammatory osseous changes. IMPRESSION: No acute osseous abnormalities. Electronically Signed   By: Lavonia Dana M.D.   On: 11/29/2017 13:29   Dg Hip Unilat W Or Wo Pelvis 2-3 Views Right  Result Date: 12/11/2017 CLINICAL DATA:  Fall 2 weeks ago, right hip pain EXAM: DG HIP (WITH OR WITHOUT PELVIS) 2-3V RIGHT COMPARISON:  None. FINDINGS: No fracture or dislocation is seen. Bilateral hip joint spaces are symmetric. Visualized bony pelvis appears intact. IMPRESSION: Negative. Electronically Signed   By: Julian Hy M.D.   On: 12/11/2017 23:34    Assessment/Plan  1. Acute pain of right lower extremity - continue Ultram 50 mg 1 tab Q 6 hours PRN X 5 days, continue brace on right foot   2. Diabetes mellitus due to  underlying condition with diabetic neuropathy, unspecified whether long term insulin use ([place]) -  continue Humalog 100 units/ml inject 8 units Q AM, 10 units @ lunch and 14 units Q supper, Tresiba 200 units/ml inject 60 units Q HS, Lyrica 75 mg 1 capsule TID Lab Results  Component Value Date   HGBA1C 9.3 (H) 12/11/2017     3. Chronic diastolic heart failure (HCC) - no SOB, continue Metolazone 5 mg daily PRN for weight gain of 3 lbs overnight or 5 bs in a week, Demadex 20 mg give 2 tabs = 40 mg daily   4. Hypothyroidism, unspecified type - continue Synthroid 150 mcg Lab Results  Component Value Date   TSH 0.279 (L) 12/11/2017     5. Hyperlipidemia LDL goal <70 - continue Simvastatin 20 mg 1 tab Q HS   6. Chronic constipation - complained of constipation, will change Linzess 145 mcg daily from PRN to daily     Family/ staff Communication:  Discussed plan of care with patient.  Labs/tests ordered:  CBC and BMP in 1 week; tsh and free T4 in 4 weeks  Goals of care:  Short-term care   Durenda Age, NP [facility] [phone] (Monday-Friday 8:00 a.m. - 5:00 p.m.) [phone] (after hours)

## 2017-12-19 ENCOUNTER — Encounter (HOSPITAL_COMMUNITY): Payer: Medicare Other

## 2017-12-19 ENCOUNTER — Encounter: Payer: Self-pay | Admitting: Internal Medicine

## 2017-12-19 ENCOUNTER — Non-Acute Institutional Stay (SKILLED_NURSING_FACILITY): Payer: Medicare Other | Admitting: Internal Medicine

## 2017-12-19 DIAGNOSIS — H8110 Benign paroxysmal vertigo, unspecified ear: Secondary | ICD-10-CM | POA: Diagnosis not present

## 2017-12-19 DIAGNOSIS — E1121 Type 2 diabetes mellitus with diabetic nephropathy: Secondary | ICD-10-CM

## 2017-12-19 DIAGNOSIS — D72829 Elevated white blood cell count, unspecified: Secondary | ICD-10-CM

## 2017-12-19 DIAGNOSIS — M79604 Pain in right leg: Secondary | ICD-10-CM

## 2017-12-19 NOTE — Assessment & Plan Note (Signed)
Creatinine 1.4-2.3 Check BMP 12/21/17

## 2017-12-19 NOTE — Assessment & Plan Note (Signed)
PT/OT at SNF °

## 2017-12-19 NOTE — Assessment & Plan Note (Signed)
Check CBC approximately 12/21/2017

## 2017-12-19 NOTE — Patient Instructions (Signed)
See assessment and plan under each diagnosis in the problem list and acutely for this visit 

## 2017-12-19 NOTE — Assessment & Plan Note (Addendum)
See 12/19/17 PT/OT to assess and treat as indicated

## 2017-12-19 NOTE — Progress Notes (Signed)
NURSING HOME LOCATION:  [facility] ROOM NUMBER:  [id number]  CODE STATUS:  Full Code  PCP:  Velna Hatchet, MD  [place] [id number]  This is a comprehensive admission note to [facility] performed on this date less than 30 days from date of admission.  Included are preadmission medical/surgical history; reconciled medication list; family history; social history and comprehensive review of systems. Corrections and additions to the records were documented. Comprehensive physical exam was also performed. Additionally a clinical summary was entered for each active diagnosis pertinent to this admission in the Problem List to enhance continuity of care.  HPI: The patient was hospitalized 7/29-12/14/2017 admitted with progressive lower extremity weakness and pain.She lives alone and is supported by family members and daytime aides 7 days/week.   A week prior to admission she had been evaluated in ED for a fall. Imaging revealed no acute fractures and the patient was discharged home.   Over the 4 days prior to admission pain in lower extremities worsened & 2 nights prior to admission she required help from the bedside commode to the bed because of severe right leg and groin pain In the ED the patient was unable to mobilize out of the bed without maximal assistance of 3 people. CT of the right hip revealed only mild subcutaneous edema.   During the hospitalization she remained stable.  PT recommended skilled nursing placement for rehab. Her baseline creatinine was found to be 1.4-2.3 based on chart review.  She is on spironolactone. She was felt to have reactive leukocytosis as there was no sign of infection. WBC did trend down. Hemoglobin A1c was 9.3% on 11/21/2017 indicating uncontrolled diabetes.  Glucoses at the SNF have ranged from a low of 106 to 402.  The 106 was at bedtime and glucose 402 post evening meal. She is on short acting insulin before meals 8 units in the  morning, 10 units at lunch and 14 units at dinner.  She is on high-dose basal insulin at 60 units at bedtime. BMP/CBC recommended approximately 8/8.  Recheck of TSH and free T4 was recommended as an outpatient 4-6 weeks post discharge.  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 Surgeries include cholecystectomy, hysterectomy.  Social history: Smoker, nondrinker.  Family history: Reviewed   Review of systems:   PT/OT validates  that pain in the right lower extremity is improving as is her mobilization.  They have applied a brace to the right ankle for support.   She states that glucoses at home have been as low as 42 and as high as 400-500.  Her aides check her glucoses. She has a "talking machine" but it is not working.  She describes significant thirst and urinary frequency.  She questions whether the frequency is due to the diuretics.   She has symptoms of benign positional vertigo when she turns in bed and into supine position.  She is compliant with her CPAP.  Constitutional: No fever, significant weight change, fatigue  Eyes: No redness, discharge, pain, vision change ENT/mouth: No nasal congestion, purulent discharge, earache, change in hearing, sore throat  Cardiovascular: No chest pain, palpitations, paroxysmal nocturnal dyspnea, claudication, edema  Respiratory: No cough, sputum production, hemoptysis, DOE, significant snoring, apnea Gastrointestinal: No heartburn, dysphagia, abdominal pain, nausea /vomiting, rectal bleeding, melena, change in bowels Genitourinary: No dysuria, hematuria, pyuria, incontinence, nocturia Dermatologic: No rash,  pruritus, change in appearance of skin Neurologic: No headache, syncope, seizures Psychiatric: No significant anxiety, depression, insomnia, anorexia Endocrine: No change in  hair/skin/nails,  excessive hunger  Hematologic/lymphatic: No significant bruising, lymphadenopathy, abnormal bleeding Allergy/immunology: No itchy/watery eyes, significant sneezing, urticaria, angioedema  Physical exam:  Pertinent or positive findings: 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 OD is sightless. OD tends to deviate superiorly. She is edentulous but wears only the upper plate. Breath sounds & heart sounds are decreased.  Abdomen is protuberant.  Pedal pulses are decreased.  She is symmetrically weak.  General appearance: Adequately nourished; no acute distress, increased work of breathing is present.   Lymphatic: No lymphadenopathy about the head, neck, axilla. Eyes: No conjunctival inflammation or lid edema is present. There is no scleral icterus of OD. Ears:  External ear exam shows no significant lesions or deformities.   Nose:  External nasal examination shows no deformity or inflammation. Nasal mucosa are pink and moist without lesions, exudates Oral exam: Lips and gums are healthy appearing.There is no oropharyngeal erythema or exudate. Neck:  No thyromegaly, masses, tenderness noted.    Heart:  No gallop, murmur, click, rub.  Lungs:  without wheezes, rhonchi, rales, rubs. Abdomen: Bowel sounds are normal.  Abdomen is soft and nontender with no organomegaly, hernias, masses. GU: Deferred  Extremities:  No cyanosis, clubbing, edema. Neurologic exam:  Balance, Rhomberg, finger to nose testing could not be completed due to clinical state Skin: Warm & dry w/o tenting. No significant lesions or rash.  See clinical summary under each active problem in the Problem List with associated updated therapeutic plan

## 2017-12-28 NOTE — Progress Notes (Signed)
[facility]  Referring Physician: Dr.Berry Primary Cardiologist: Dr. Gwenlyn Found  PCP: Dr. Ardeth Perfect HF MD: Dr Haroldine Laws  HPI: Amy [name] is a 57 y.o. female with obesity, HTN, diabetes with legal blindness, HL, OSA and diastolic HF.  She was referred by Dr. Gwenlyn Found for further evaluation and management of diastolic HF.   She had a negative Myoview 10/10/14 and a 2-D echo performed 07/06/15 that revealed normal LV/RV function and grade 2 diastolic dysfunction.  Admitted 7/29-12/14/17 after a mechanical fall. She was having increased pain in RLE and groin and presented to [facility] 2 days after fall. Imaging negative for fractures. PT recommended SNF at [place]. Volume was stable.  She presents today for post hospital follow up. She was discharged from [facility] yesterday and is now at home. HH to start soon through [facility]. Overall doing well. She is recovering from fall (mechanical, denies any dizziness) and is slowly getting stronger. She is able to walk from bedroom to living room without SOB. She has to go slowly due to right ankle pain. Denies orthopnea, PND, or edema. No cough, fever, or chills. No CP, palpitations, or dizziness. Wearing CPAP qHS. She was receiving Linzess [facility] at [facility] and was having diarrhea.  Limits fluid and salt intake. Weights have gone down from DC from 222 to 208 lbs. Has not taken PRN metolazone.    Review of systems complete and found to be negative unless listed in HPI.   Past Medical History:  Diagnosis Date  . Arthritis   . Asthma   . Blind   . Blindness and low vision    left eye glass eye,  legally blind in right eye  . Diabetes mellitus   . Diabetic neuropathy ([place])   . Hyperlipidemia   . Hypertension   . Hypothyroidism   . Spinal stenosis     Current Outpatient Medications  Medication Sig Dispense Refill  . albuterol (PROVENTIL HFA;VENTOLIN HFA) 108 (90 BASE) MCG/ACT inhaler Inhale 2 puffs into the lungs every 4 (four) hours as needed for  wheezing or shortness of breath. For short    . blood glucose meter kit and supplies KIT Prodig[employer] Autocode glucose meter. Dispense based on patient and insurance preference. Use up to four times daily as directed. (FOR ICD-9 250.00, 250.01). 1 each 0  . cetirizine (ZYRTEC) 10 MG tablet Take 10 mg by mouth daily.     Marland Kitchen esomeprazole (NEXIUM) 40 MG capsule Take 40 mg by mouth daily at 12 noon.     . fluticasone (FLONASE) 50 MCG/ACT nasal spray Place into both nostrils daily.     Marland Kitchen HUMALOG KWIKPEN 100 UNIT/ML KiwkPen Inject into the skin 3 (three) times daily. 12-12-17 take 8 units in morning, 10 units at lunch, 14 units at dinner    . Insulin Degludec (TRESIBA FLEXTOUCH) 200 UNIT/ML SOPN Inject 60 Units into the skin at bedtime.     Marland Kitchen levothyroxine (SYNTHROID, LEVOTHROID) 150 MCG tablet Take 150 mcg by mouth daily before breakfast.    . Linaclotide (LINZESS) 145 MCG CAPS capsule Take 145 mcg by mouth daily as needed (constipation).     . metolazone (ZAROXOLYN) 5 MG tablet TAKE 1 TABLET BY MOUTH ONCE DAILY AS NEEDED. TAKE FOR WEIGHT GAIN OF 3 LBS OVERNIGHT OR 5LBS IN A WEEK 30 tablet 1  . potassium chloride SA (K-DUR,KLOR-CON) 20 MEQ tablet Take 2 tablets (40 mEq total) by mouth daily. 60 tablet 3  . pregabalin (LYRICA) 75 MG capsule Take 1  capsule (75 mg total) by mouth 3 (three) times daily. 90 capsule 0  . simvastatin (ZOCOR) 20 MG tablet Take 20 mg by mouth daily.    Marland Kitchen spironolactone (ALDACTONE) 25 MG tablet Take 0.5 tablets (12.5 mg total) by mouth daily. 90 tablet 3  . torsemide (DEMADEX) 20 MG tablet Take 2 tablets (40 mg total) by mouth daily. 60 tablet 11  . triamcinolone cream (KENALOG) 0.1 % Apply 1 application topically 2 (two) times daily.     No current facility-administered medications for this encounter.     Allergies  Allergen Reactions  . Penicillins Hives and Swelling    Has patient had a PCN reaction causing immediate rash, facial/tongue/throat swelling, SOB or lightheadedness  with hypotension: yes- face swelling Has patient had a PCN reaction causing severe rash involving mucus membranes or skin necrosis: no Has patient had a PCN reaction that required hospitalization unknown (childhood allergy) Has patient had a PCN reaction occurring within the last 10 years: no If all of the above answers are "NO", then may proceed with Cephalosporin use.   . Codeine Nausea Only  . Iodine-131 Hives    hives  . Iohexol Hives and Rash    pt developed itching and hives along with nasal congestion; needs 13 hour premeds for future studies, Onset Date: [id number]   Code: HIVES, Desc: pt developed itching and hives along with nasal congestion; needs 13 hour premeds for future studies, Onset Date: [id number]  Code: HIVES, Desc: pt developed itching and hives along with nasal congestion; needs 13 hour premeds for future studies, Onset Date: [id number]   . Lisinopril Cough  . Sulfa Antibiotics Nausea And Vomiting      Social History   Socioeconomic History  . Marital status: Single    Spouse name: Not on file  . Number of children: 2  . Years of education: 82  . Highest education level: Not on file  Occupational History  . Occupation: N/A    Comment: disabled  Social Needs  . Financial resource strain: Not on file  . Food insecurity:    Worry: Not on file    Inability: Not on file  . Transportation needs:    Medical: Not on file    Non-medical: Not on file  Tobacco Use  . Smoking status: Former Smoker    Types: Cigarettes    Last attempt to quit: 05/22/1992    Years since quitting: 25.6  . Smokeless tobacco: Never Used  Substance and Sexual Activity  . Alcohol use: No    Comment: quit 20 yrs ago  . Drug use: No  . Sexual activity: Not on file  Lifestyle  . Physical activity:    Days per week: Not on file    Minutes per session: Not on file  . Stress: Not on file  Relationships  . Social connections:    Talks on phone: Not on file    Gets together: Not on file      Attends religious service: Not on file    Active member of club or organization: Not on file    Attends meetings of clubs or organizations: Not on file    Relationship status: Not on file  . Intimate partner violence:    Fear of current or ex partner: Not on file    Emotionally abused: Not on file    Physically abused: Not on file    Forced sexual activity: Not on file  Other Topics Concern  . Not on file  Social History Narrative   Lives alone   caffeine drinks 2 cups of coffee a day, occasional soda    Family History  Problem Relation Age of Onset  . Diabetes Sister   . Glaucoma Sister   . Hypertension Sister   . Stroke Brother   . Heart attack Paternal Aunt    Vitals:   12/29/17 0942  BP: 130/72  Pulse: 74  SpO2: 97%  Weight: 93 kg (205 lb 2 oz)   Wt Readings from Last 3 Encounters:  12/29/17 93 kg (205 lb 2 oz)  12/19/17 98.4 kg (217 lb)  12/15/17 98.4 kg (217 lb)    PHYSICAL EXAM: General: No resp difficulty. Arrived in wheelchair. HEENT: Normal Neck: Supple. No JVD. Carotids 2+ bilat; no bruits. No thyromegaly or nodule noted. Cor: PMI nondisplaced. RRR, No M/G/R noted Lungs: CTAB, normal effort. Abdomen: Soft, non-tender, non-distended, no HSM. No bruits or masses. +BS  Extremities: No cyanosis, clubbing, or rash. R and LLE no edema. Brace on right ankle Neuro: Alert & orientedx3, cranial nerves grossly intact. moves all 4 extremities w/o difficulty. Affect pleasant  ReDS clip: 32%  ASSESSMENT & PLAN: 1. Chronic diastolic HF - Echo 4/36 Normal LV/R function Grade II DD - NYHA II-III, difficult to assess with limited activity - Volume status stable to dry on exam. Stable on ReDS clip (32%). Last creatinine 2.1 and she has had diarrhea this week. Weight down 7 lbs on our scale. - Hold torsemide and potassium for 3 days, then resume torsemide 40 mg daily + potassium 40 meq daily - Continue spiro 12.5 mg daily. Hold off increasing with recent creatinine  >2.0 - Reinforced daily weights, low salt food choices, and limiting fluid intake to < 2 liters per day.  - Schedule repeat echo with Dr Haroldine Laws  2. HTN - Stable.  3. DM2 - Per PCP.  - With HF would strongly consider Jardiance. No change.  4. OSA  - Continue CPAP qhs. Wears every night.  5. CKD III, baseline creatinine 1.5-1.8 - Last creatinine elevated 2.17, check BMET today.  6. Fall - Back at home as of yesterday. HH PT supposed to start next week.  Schedule echo with Dr Haroldine Laws in 3 months BMET today Hold torsemide and potassium for 3 days, then resume as previous dose Will make sure she has [facility] PT referral in place  Georgiana Shore, NP  9:45 AM  Greater than 50% of the 25 minute visit was spent in counseling/coordination of care regarding disease state education, salt/fluid restriction, sliding scale diuretics, and medication compliance.

## 2017-12-29 ENCOUNTER — Encounter (HOSPITAL_COMMUNITY): Payer: Self-pay

## 2017-12-29 ENCOUNTER — Other Ambulatory Visit: Payer: Self-pay

## 2017-12-29 ENCOUNTER — Ambulatory Visit (HOSPITAL_COMMUNITY)
Admission: RE | Admit: 2017-12-29 | Discharge: 2017-12-29 | Disposition: A | Discharge status: Home / Self Care | Payer: Medicare Other | Source: Ambulatory Visit | Attending: Cardiology | Admitting: Cardiology

## 2017-12-29 VITALS — BP 130/72 | HR 74 | Wt 205.1 lb

## 2017-12-29 DIAGNOSIS — H548 Legal blindness, as defined in USA: Secondary | ICD-10-CM | POA: Insufficient documentation

## 2017-12-29 DIAGNOSIS — Z8249 Family history of ischemic heart disease and other diseases of the circulatory system: Secondary | ICD-10-CM | POA: Insufficient documentation

## 2017-12-29 DIAGNOSIS — Z9181 History of falling: Secondary | ICD-10-CM

## 2017-12-29 DIAGNOSIS — Z79890 Hormone replacement therapy (postmenopausal): Secondary | ICD-10-CM | POA: Diagnosis not present

## 2017-12-29 DIAGNOSIS — Z7951 Long term (current) use of inhaled steroids: Secondary | ICD-10-CM | POA: Diagnosis not present

## 2017-12-29 DIAGNOSIS — I5032 Chronic diastolic (congestive) heart failure: Secondary | ICD-10-CM | POA: Diagnosis not present

## 2017-12-29 DIAGNOSIS — Z880 Allergy status to penicillin: Secondary | ICD-10-CM | POA: Insufficient documentation

## 2017-12-29 DIAGNOSIS — Z888 Allergy status to other drugs, medicaments and biological substances status: Secondary | ICD-10-CM | POA: Diagnosis not present

## 2017-12-29 DIAGNOSIS — Z882 Allergy status to sulfonamides status: Secondary | ICD-10-CM | POA: Diagnosis not present

## 2017-12-29 DIAGNOSIS — Z885 Allergy status to narcotic agent status: Secondary | ICD-10-CM | POA: Diagnosis not present

## 2017-12-29 DIAGNOSIS — Z823 Family history of stroke: Secondary | ICD-10-CM | POA: Insufficient documentation

## 2017-12-29 DIAGNOSIS — R197 Diarrhea, unspecified: Secondary | ICD-10-CM | POA: Diagnosis not present

## 2017-12-29 DIAGNOSIS — E785 Hyperlipidemia, unspecified: Secondary | ICD-10-CM | POA: Insufficient documentation

## 2017-12-29 DIAGNOSIS — Z79899 Other long term (current) drug therapy: Secondary | ICD-10-CM | POA: Diagnosis not present

## 2017-12-29 DIAGNOSIS — N183 Chronic kidney disease, stage 3 unspecified: Secondary | ICD-10-CM

## 2017-12-29 DIAGNOSIS — G4733 Obstructive sleep apnea (adult) (pediatric): Secondary | ICD-10-CM | POA: Diagnosis not present

## 2017-12-29 DIAGNOSIS — E669 Obesity, unspecified: Secondary | ICD-10-CM | POA: Insufficient documentation

## 2017-12-29 DIAGNOSIS — E1140 Type 2 diabetes mellitus with diabetic neuropathy, unspecified: Secondary | ICD-10-CM | POA: Insufficient documentation

## 2017-12-29 DIAGNOSIS — E039 Hypothyroidism, unspecified: Secondary | ICD-10-CM | POA: Diagnosis not present

## 2017-12-29 DIAGNOSIS — Z6841 Body Mass Index (BMI) 40.0 and over, adult: Secondary | ICD-10-CM | POA: Insufficient documentation

## 2017-12-29 DIAGNOSIS — E1122 Type 2 diabetes mellitus with diabetic chronic kidney disease: Secondary | ICD-10-CM | POA: Diagnosis not present

## 2017-12-29 DIAGNOSIS — Z9989 Dependence on other enabling machines and devices: Secondary | ICD-10-CM

## 2017-12-29 DIAGNOSIS — I130 Hypertensive heart and chronic kidney disease with heart failure and stage 1 through stage 4 chronic kidney disease, or unspecified chronic kidney disease: Secondary | ICD-10-CM | POA: Diagnosis not present

## 2017-12-29 DIAGNOSIS — J45909 Unspecified asthma, uncomplicated: Secondary | ICD-10-CM | POA: Diagnosis not present

## 2017-12-29 DIAGNOSIS — Z794 Long term (current) use of insulin: Secondary | ICD-10-CM | POA: Diagnosis not present

## 2017-12-29 DIAGNOSIS — I10 Essential (primary) hypertension: Secondary | ICD-10-CM

## 2017-12-29 DIAGNOSIS — Z87891 Personal history of nicotine dependence: Secondary | ICD-10-CM | POA: Diagnosis not present

## 2017-12-29 DIAGNOSIS — M1990 Unspecified osteoarthritis, unspecified site: Secondary | ICD-10-CM | POA: Diagnosis not present

## 2017-12-29 DIAGNOSIS — Z833 Family history of diabetes mellitus: Secondary | ICD-10-CM | POA: Diagnosis not present

## 2017-12-29 LAB — BASIC METABOLIC PANEL
Anion gap: 9 (ref 5–15)
BUN: 53 mg/dL — ABNORMAL HIGH (ref 6–20)
CO2: 23 mmol/L (ref 22–32)
CREATININE: 1.7 mg/dL — AB (ref 0.44–1.00)
Calcium: 9.3 mg/dL (ref 8.9–10.3)
Chloride: 108 mmol/L (ref 98–111)
GFR calc non Af Amer: 32 mL/min — ABNORMAL LOW (ref >60)
GFR, EST AFRICAN AMERICAN: 37 mL/min — AB (ref >60)
Glucose, Bld: 282 mg/dL — ABNORMAL HIGH (ref 70–99)
Potassium: 4.2 mmol/L (ref 3.5–5.1)
SODIUM: 140 mmol/L (ref 135–145)

## 2017-12-29 NOTE — Progress Notes (Unsigned)
ReDS Vest - 12/29/17 1400      ReDS Vest   Fitting Posture  Sitting    Height Marker  Short    Ruler Value  35    Center Strip  Aligned    ReDS Value  32

## 2017-12-29 NOTE — Patient Instructions (Signed)
Hold Torsemide and Potassium for three days and then resume prescribed dose.  Follow up Echo in 3 months with Dr.Bensimhon.

## 2018-01-18 ENCOUNTER — Other Ambulatory Visit: Payer: Self-pay | Admitting: Internal Medicine

## 2018-01-18 DIAGNOSIS — Z1231 Encounter for screening mammogram for malignant neoplasm of breast: Secondary | ICD-10-CM

## 2018-01-21 IMAGING — DX DG KNEE COMPLETE 4+V*R*
4 series · 4 of 4 positions shown · non-contrast
Comparison: None.

CLINICAL DATA: Fall yesterday, right knee pain.

EXAM:
RIGHT KNEE - COMPLETE 4+ VIEW

[knee ap]
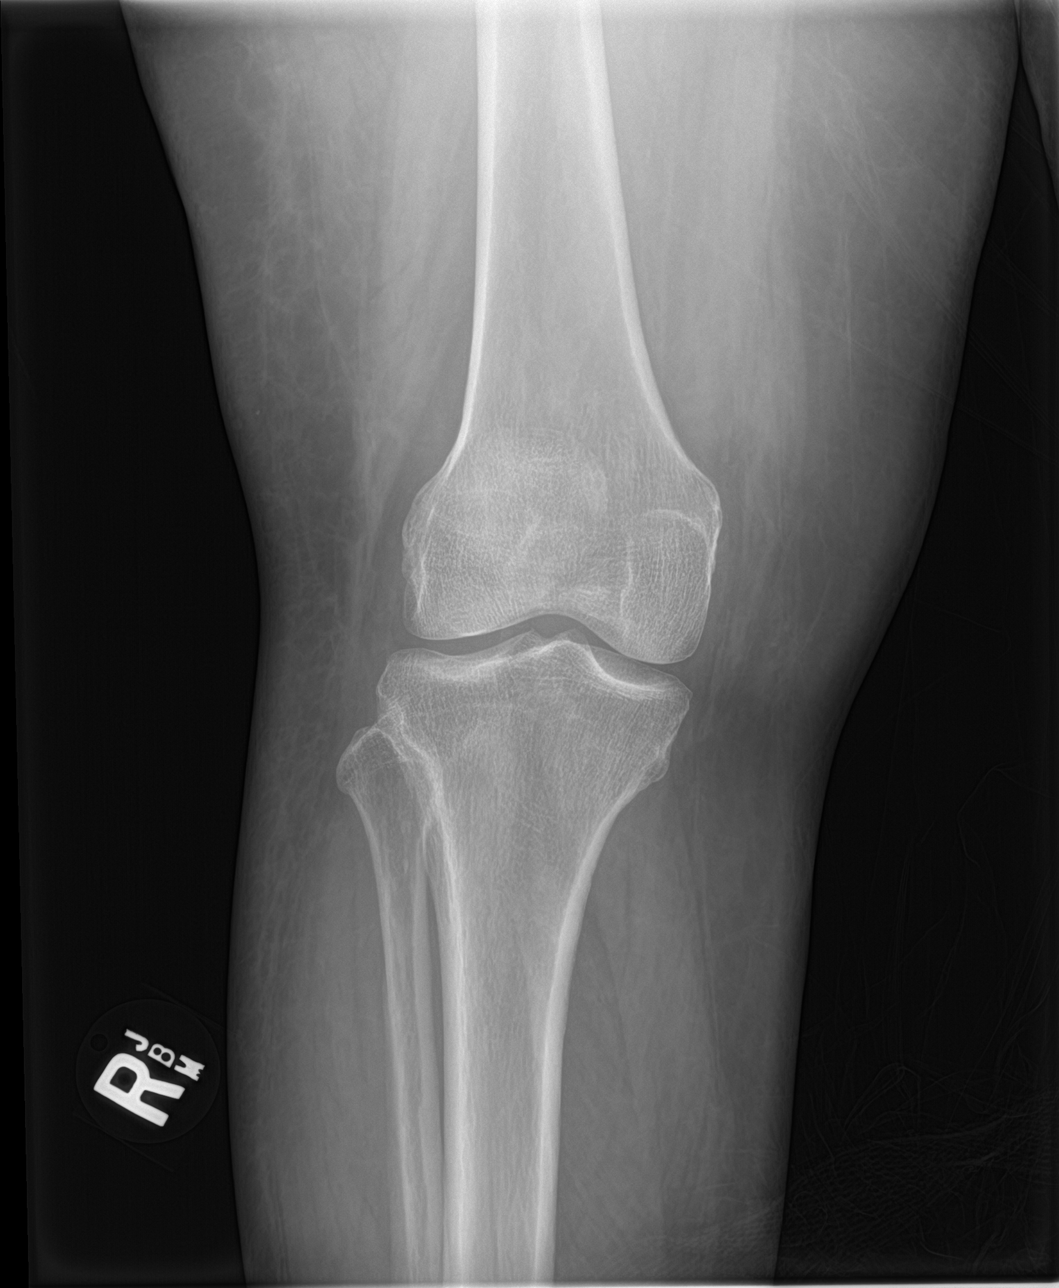

[knee lat]
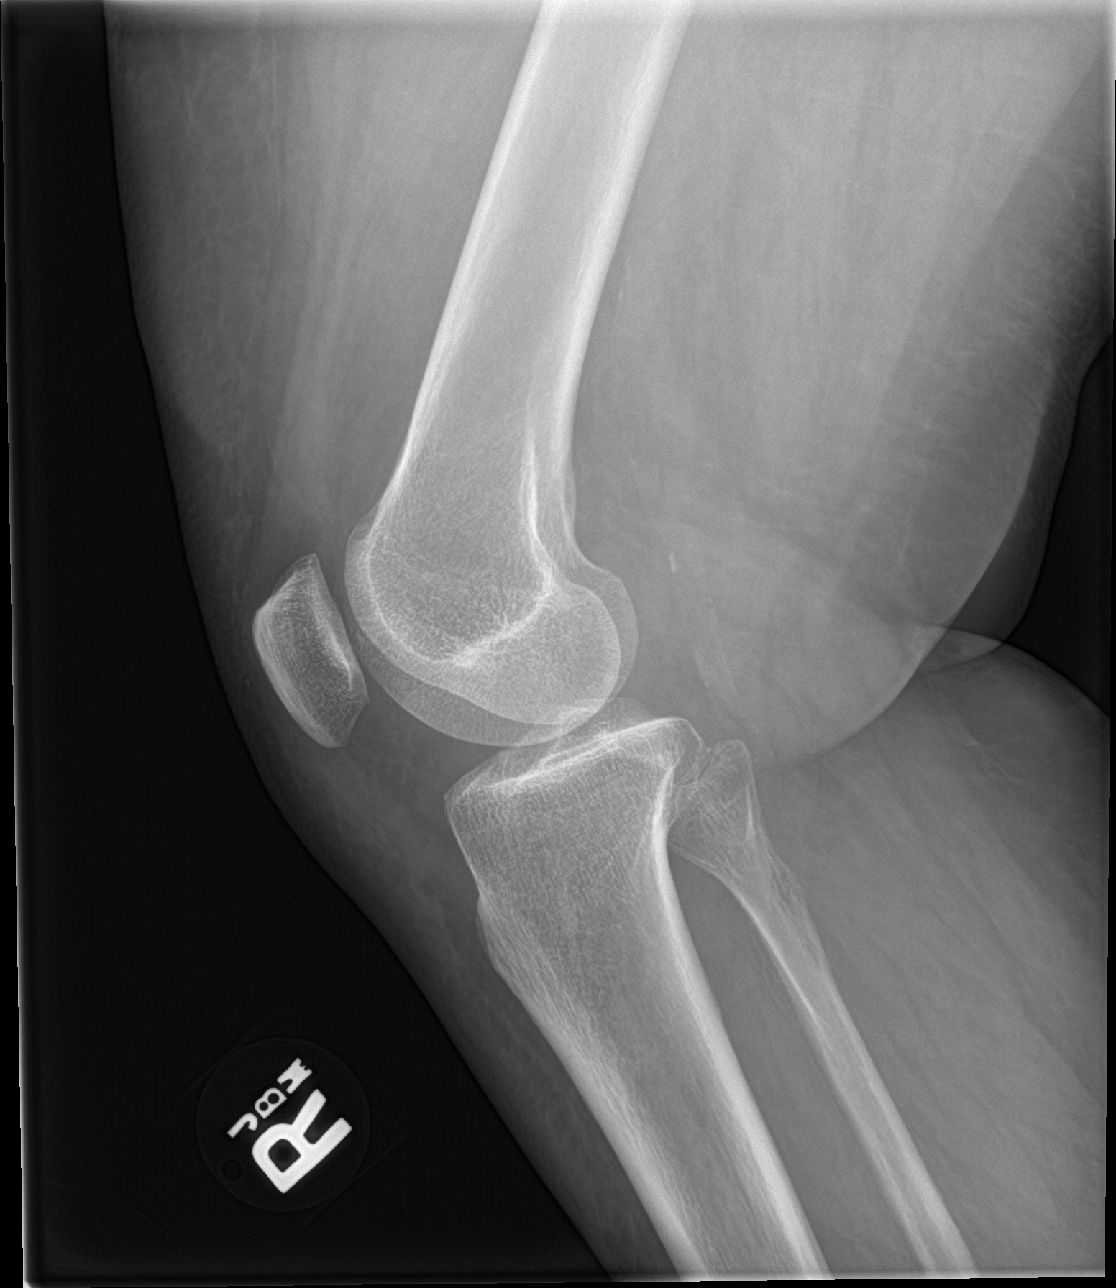

[knee obl (1 of 2)]
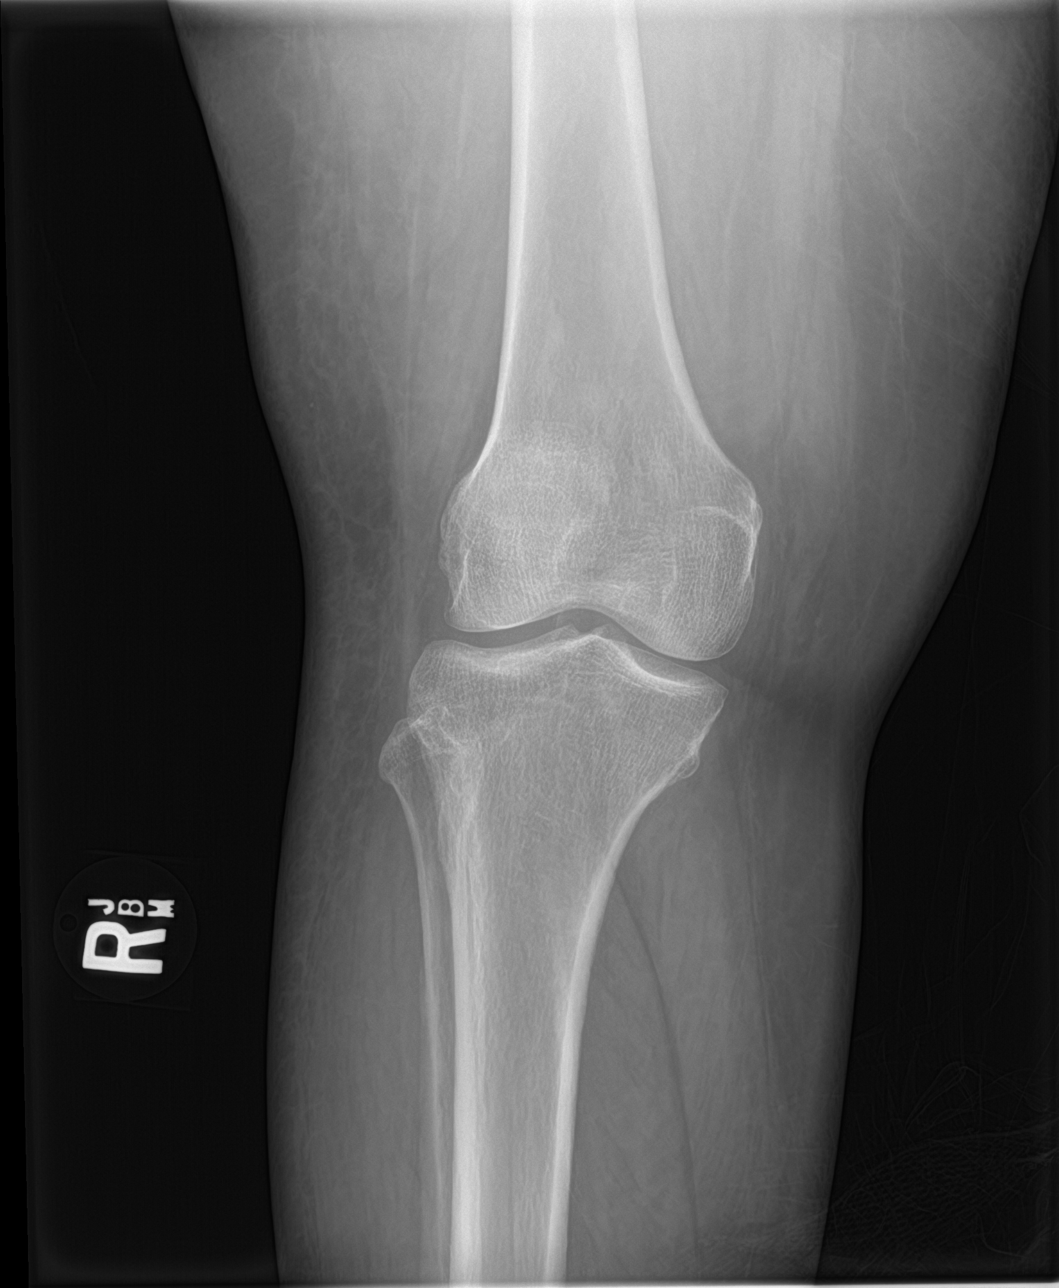

[knee obl (2 of 2)]
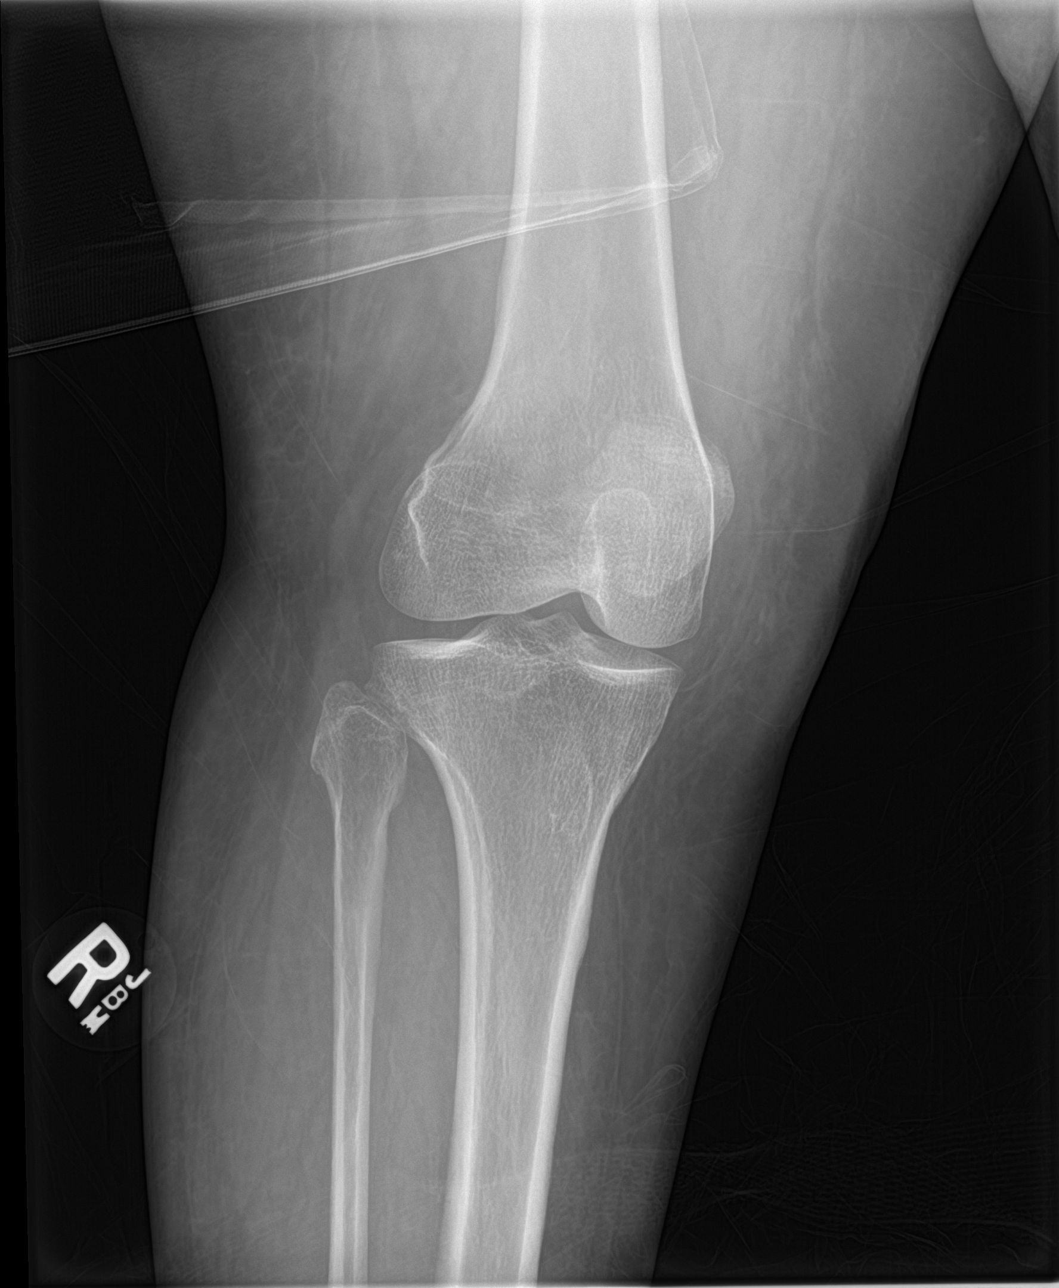

[4 of 4 positions shown; findings below may reference images not displayed]

FINDINGS: Osseous alignment is normal. Bone mineralization is normal. No
fracture line or displaced fracture fragment seen. No degenerative
change. No appreciable joint effusion.

Atherosclerotic calcifications noted within the popliteal fossa.
Adjacent soft tissues otherwise unremarkable.
IMPRESSION: 1. No acute findings. No osseous fracture or dislocation. No
appreciable joint effusion.
2. Atherosclerosis.

## 2018-01-21 IMAGING — DX DG HIP (WITH OR WITHOUT PELVIS) 2-3V*L*
3 series · 3 of 3 positions shown · non-contrast
Comparison: None.

CLINICAL DATA: Fall yesterday onto her bottom, patient is having
pain in her right knee when bending and bearing weight. Pain on the
lateral side of her left hip. Pain when turning her neck in any
direction.

EXAM:
DG HIP (WITH OR WITHOUT PELVIS) 2-3V LEFT

[pelvis ap]
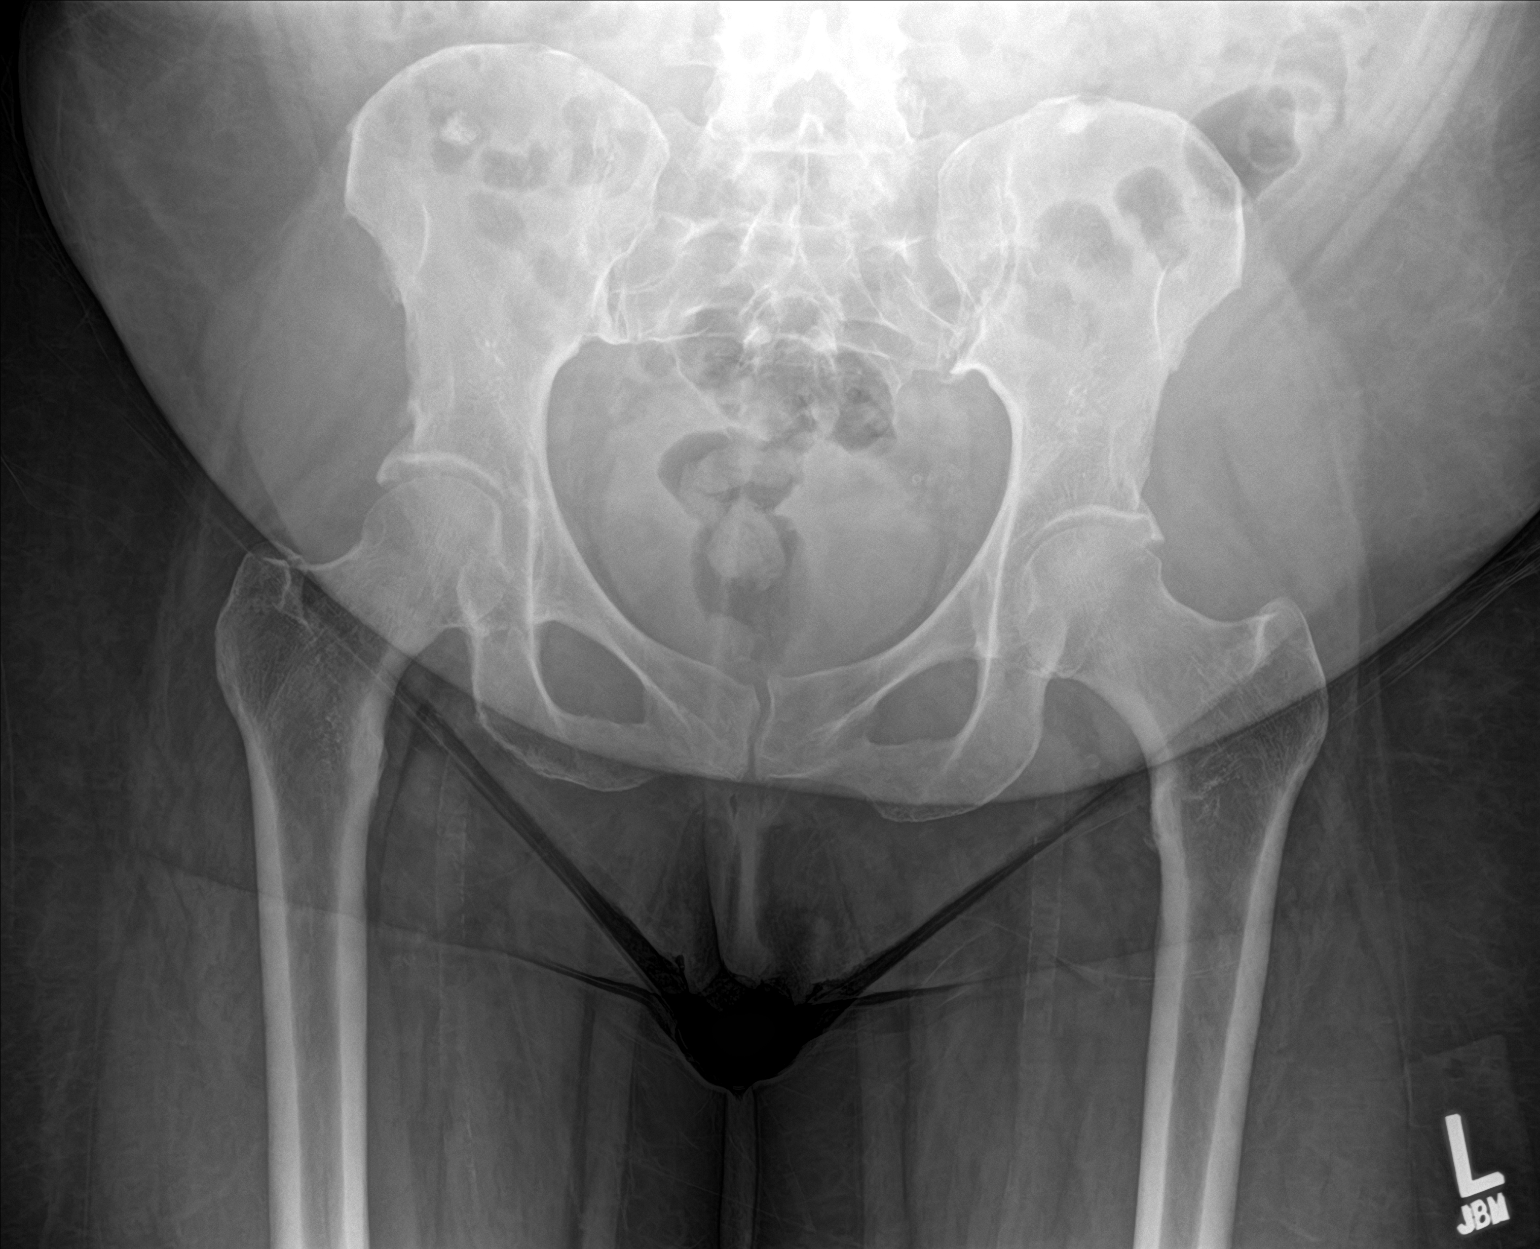

[hip ap]
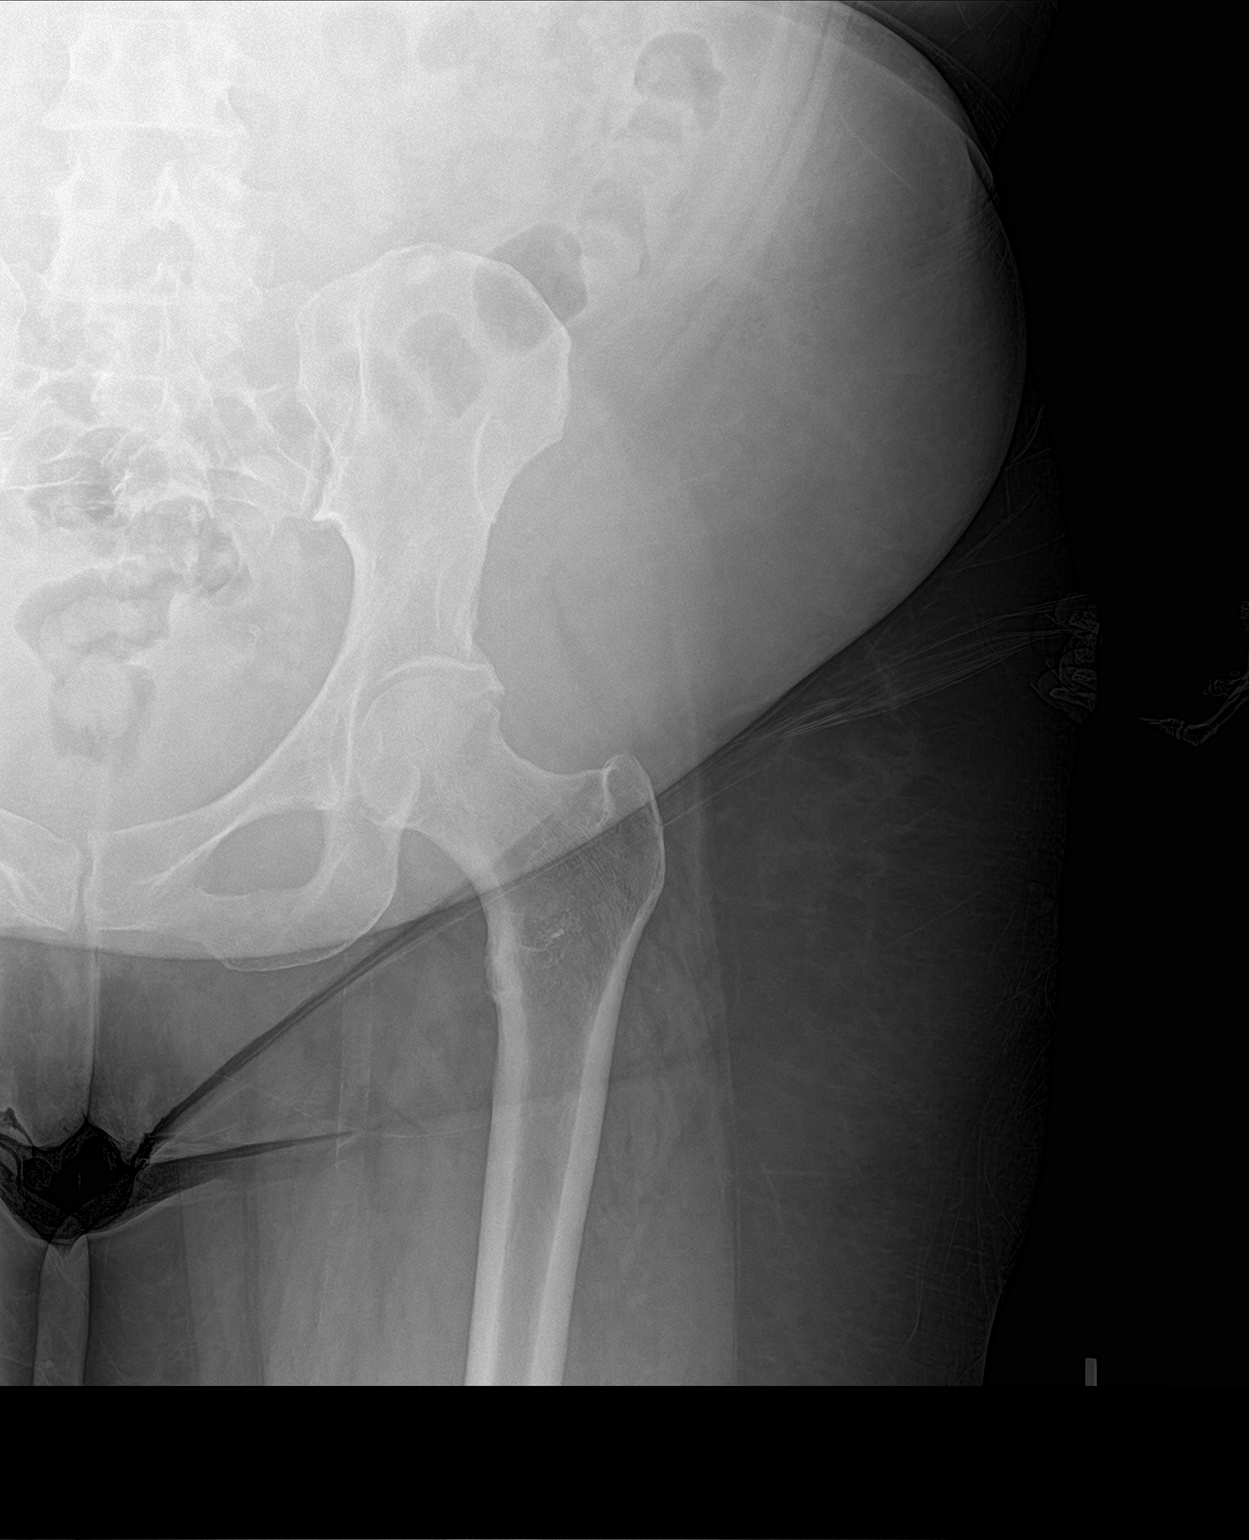

[hip lat]
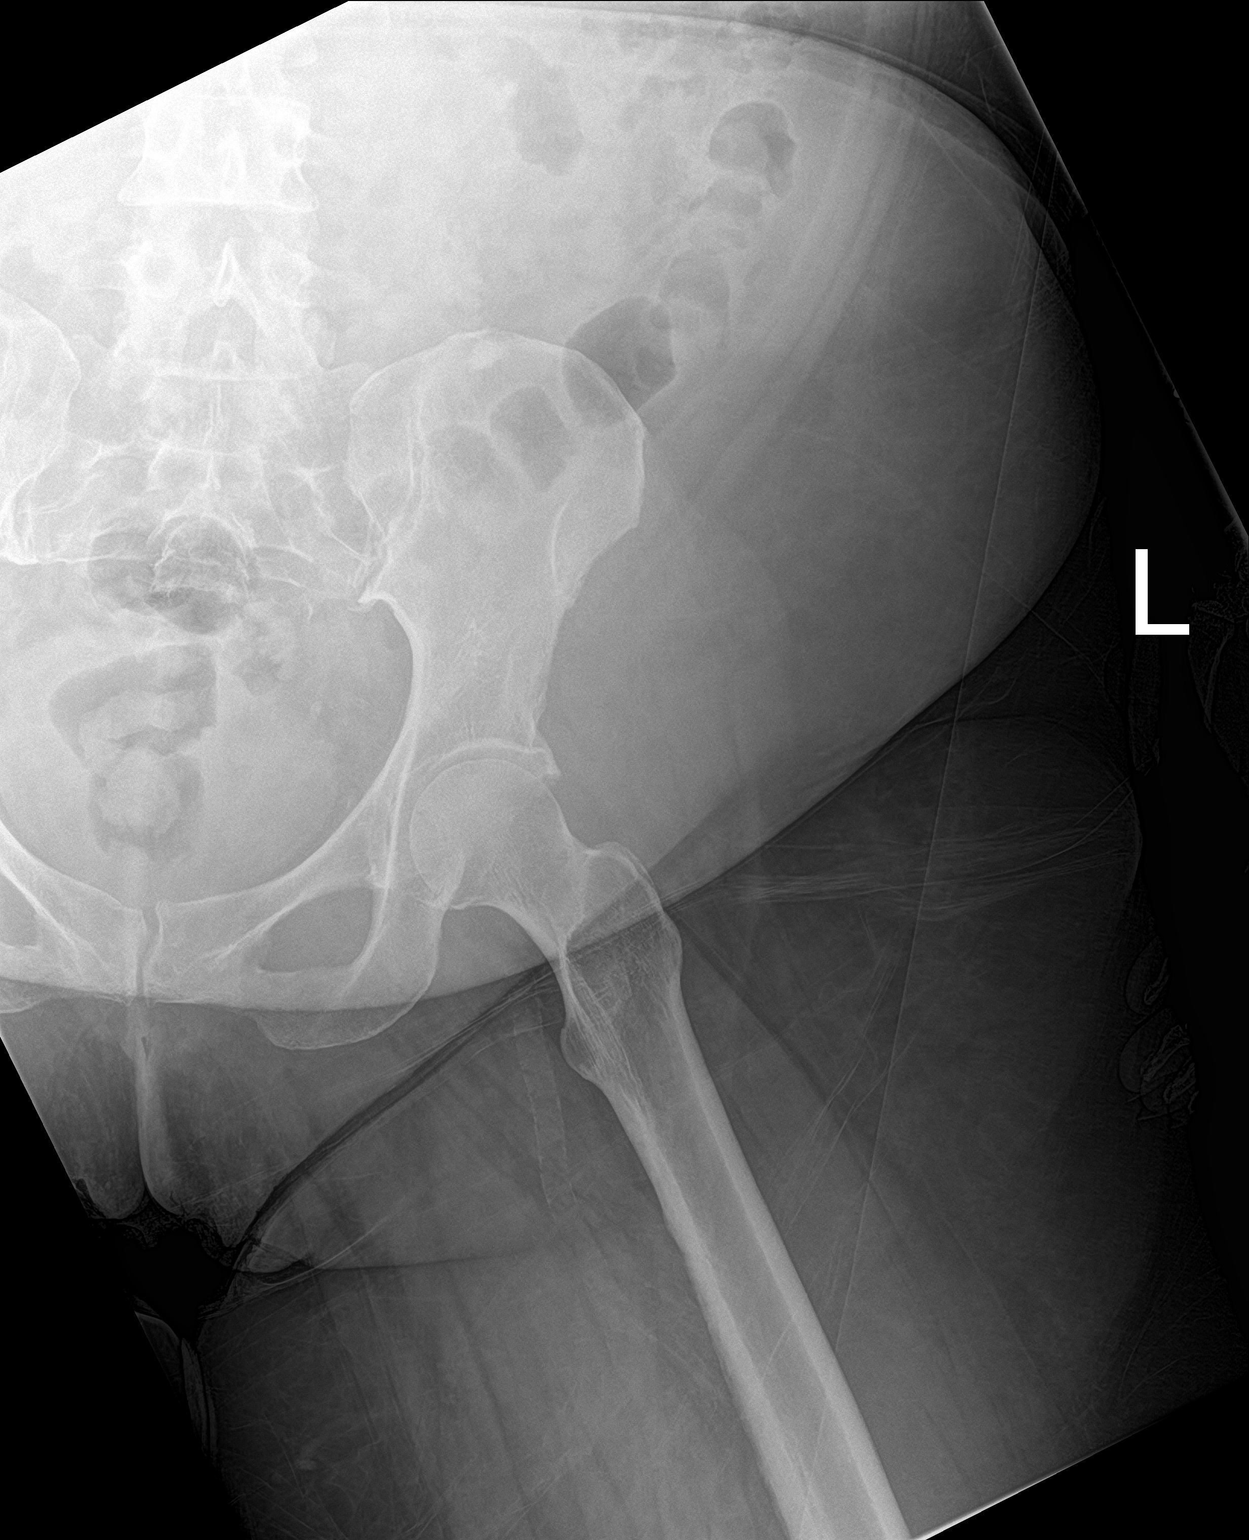

[3 of 3 positions shown; findings below may reference images not displayed]

FINDINGS: No pelvic fracture sacral fracture. Dedicated view of the LEFT hip
demonstrates no femoral neck vascular calcifications in
IMPRESSION: No pelvic fracture or LEFT hip fracture

## 2018-01-21 IMAGING — DX DG CERVICAL SPINE COMPLETE 4+V
7 series · 7 of 7 positions shown · non-contrast
Comparison: CT scan April 27, 2008

CLINICAL DATA: Status post fall.

EXAM:
CERVICAL SPINE - COMPLETE 4+ VIEW

[c-spine lat]
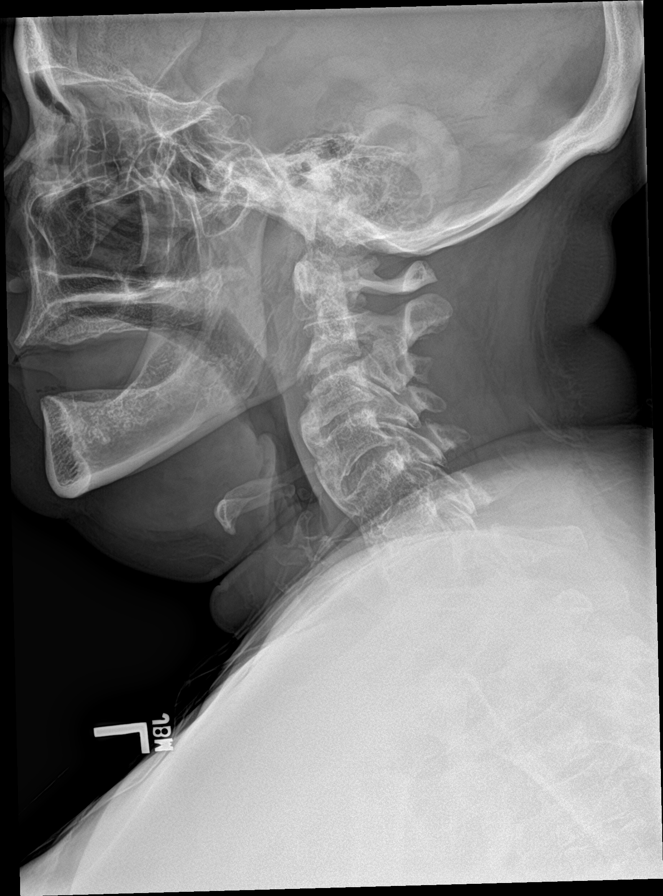

[c-spine obl (1 of 2)]
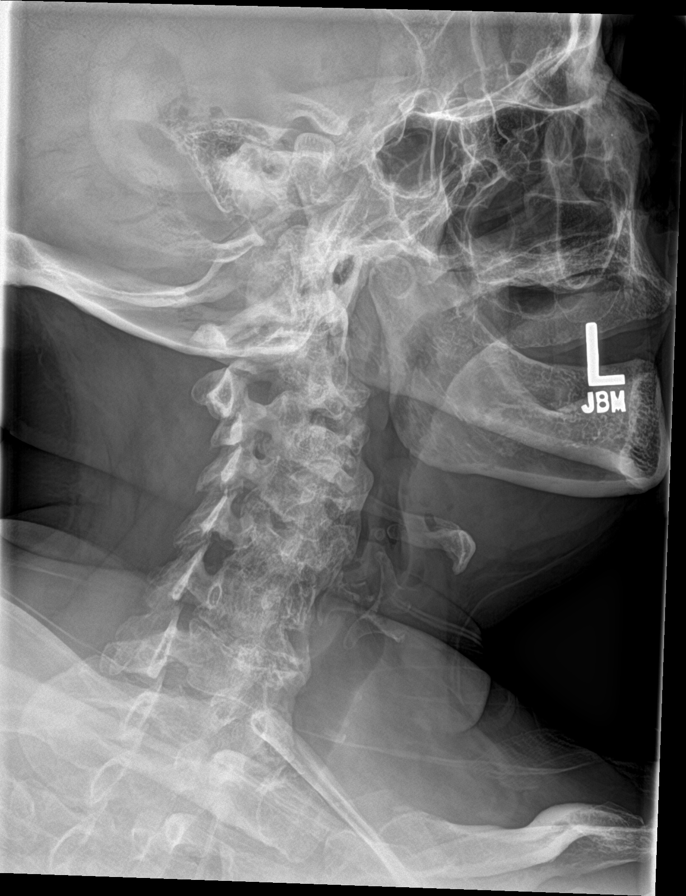

[c-spine obl (2 of 2)]
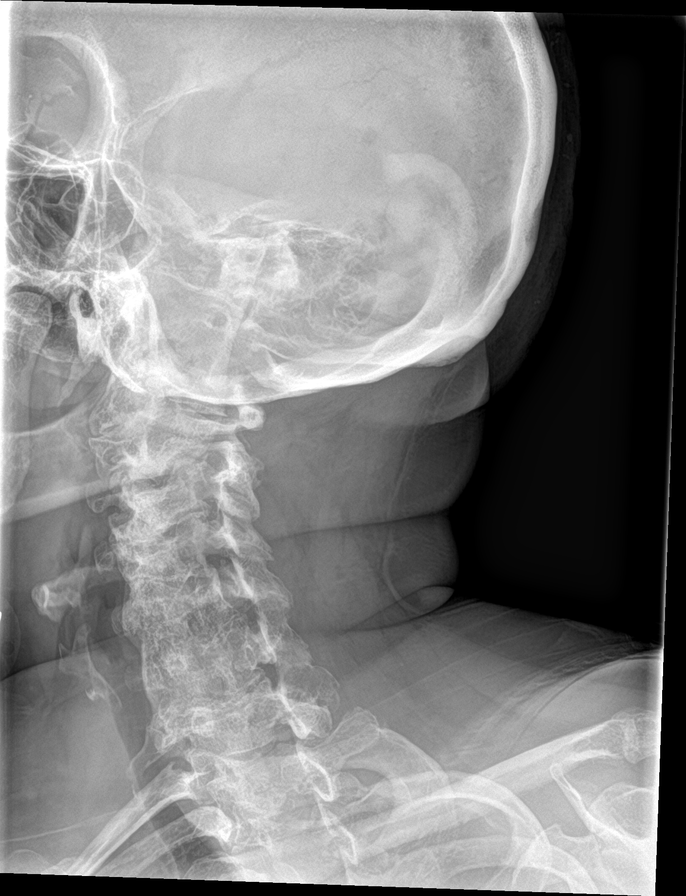

[c-spine ap]
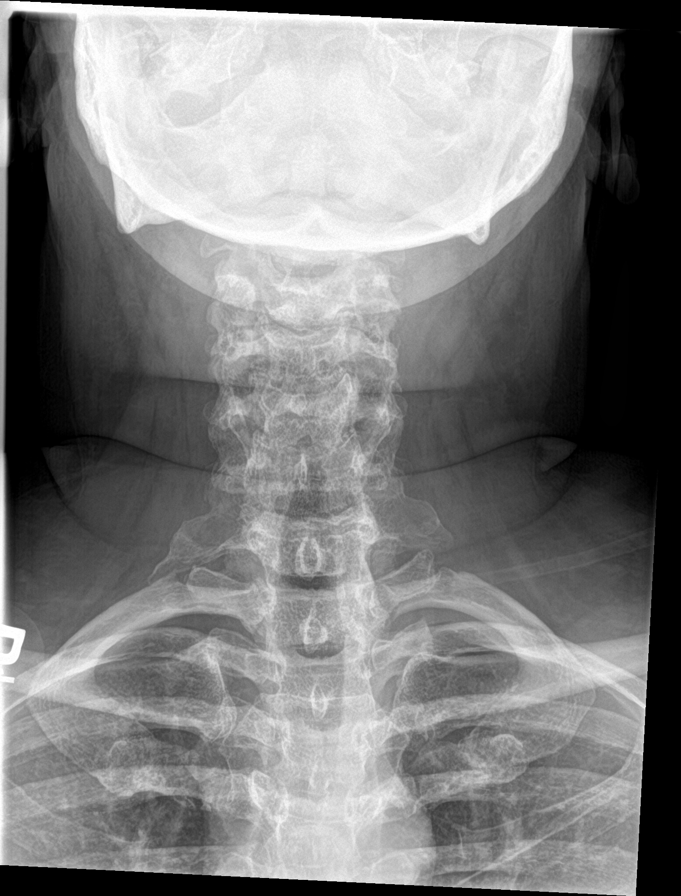

[c-spine open mouth]
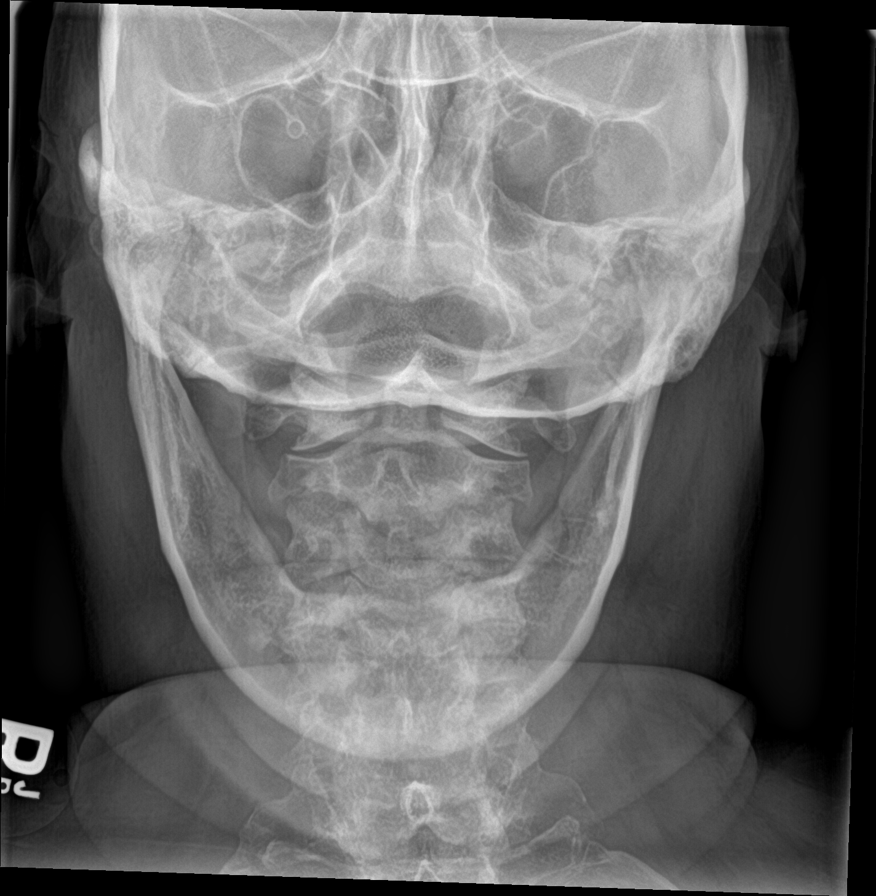

[c-spine swimmers]
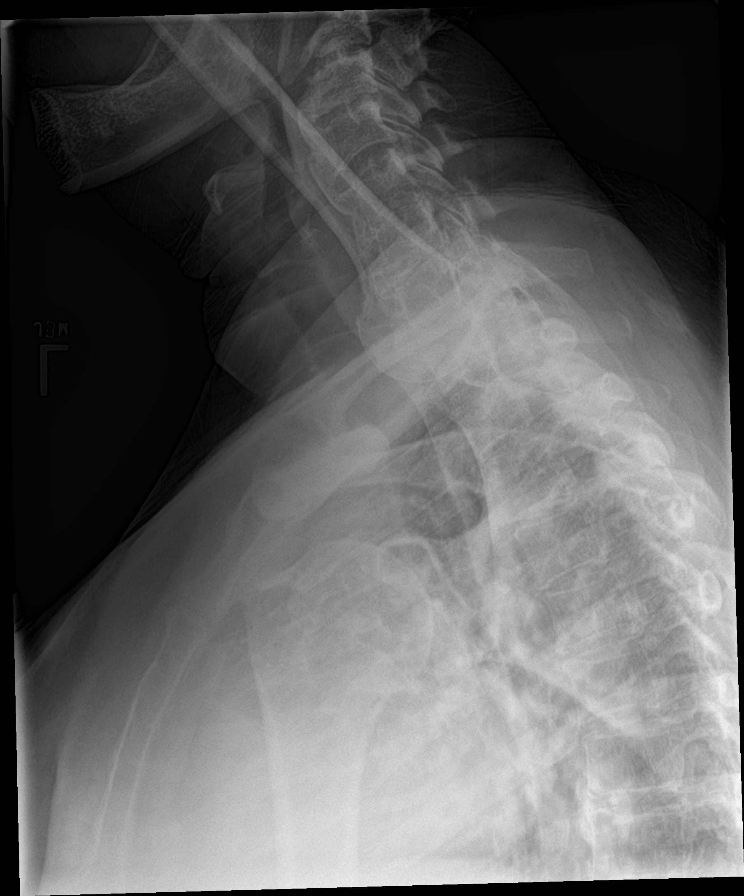

[[person_name]]
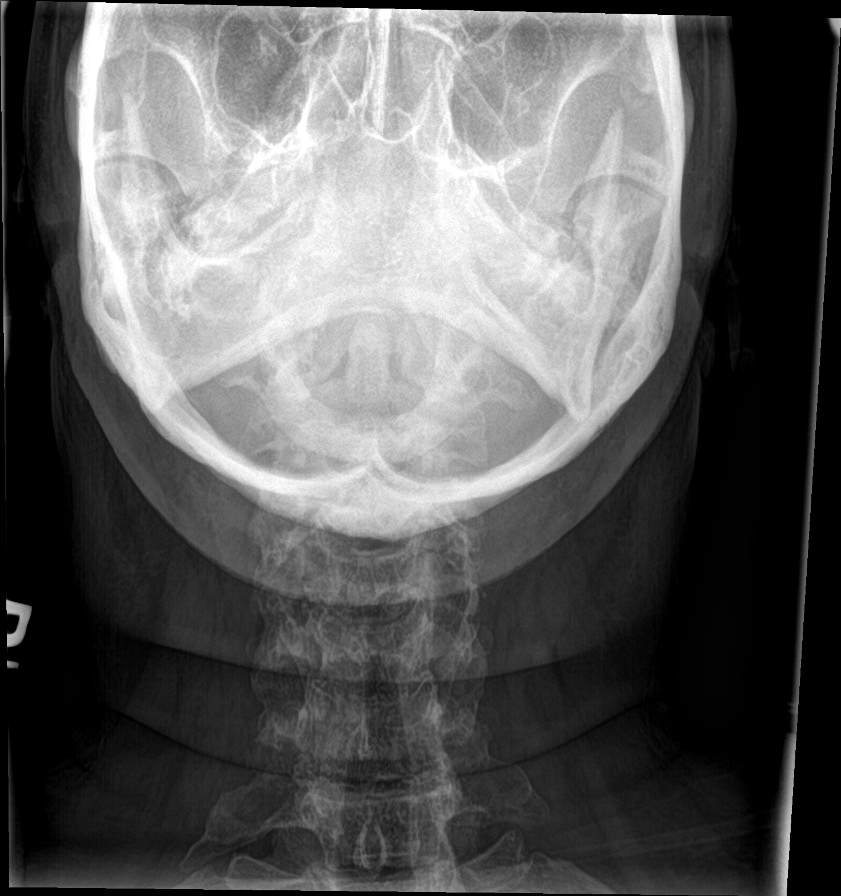

[7 of 7 positions shown; findings below may reference images not displayed]

FINDINGS: Mild prominence of the prevertebral soft tissues anterior to C1 and
C2 is unchanged based on the scout view from the April 2008 CT
scan. The pre odontoid space is normal. Severe degenerative changes
are seen most marked at C3-4 and C4-5. No traumatic malalignment. No
obvious fracture. Narrowing of multiple neural foramina identified
on oblique views. The lateral masses of C1 align with C2. The
odontoid process is unremarkable. The upper chest is normal as well.
IMPRESSION: 1. There is soft tissue thickening anterior to C1 and C2 but this
appears unchanged since Tuesday April, 2008. There are severe
multilevel degenerative changes. Evaluation for subtle fractures is
likely reduced in this patient. If there is continued concern, a CT
scan would be much more sensitive and specific.

## 2018-01-24 ENCOUNTER — Ambulatory Visit (INDEPENDENT_AMBULATORY_CARE_PROVIDER_SITE_OTHER): Payer: Medicare Other | Admitting: Cardiovascular Disease

## 2018-01-24 ENCOUNTER — Ambulatory Visit (INDEPENDENT_AMBULATORY_CARE_PROVIDER_SITE_OTHER): Payer: Medicare Other | Admitting: Podiatry

## 2018-01-24 ENCOUNTER — Encounter: Payer: Self-pay | Admitting: Podiatry

## 2018-01-24 ENCOUNTER — Encounter: Payer: Self-pay | Admitting: Cardiovascular Disease

## 2018-01-24 DIAGNOSIS — L608 Other nail disorders: Secondary | ICD-10-CM

## 2018-01-24 DIAGNOSIS — I10 Essential (primary) hypertension: Secondary | ICD-10-CM | POA: Diagnosis not present

## 2018-01-24 DIAGNOSIS — E0842 Diabetes mellitus due to underlying condition with diabetic polyneuropathy: Secondary | ICD-10-CM

## 2018-01-24 DIAGNOSIS — M79676 Pain in unspecified toe(s): Secondary | ICD-10-CM

## 2018-01-24 DIAGNOSIS — I5032 Chronic diastolic (congestive) heart failure: Secondary | ICD-10-CM

## 2018-01-24 DIAGNOSIS — B351 Tinea unguium: Secondary | ICD-10-CM | POA: Diagnosis not present

## 2018-01-24 DIAGNOSIS — L84 Corns and callosities: Secondary | ICD-10-CM | POA: Diagnosis not present

## 2018-01-24 DIAGNOSIS — E785 Hyperlipidemia, unspecified: Secondary | ICD-10-CM

## 2018-01-24 NOTE — Progress Notes (Signed)
Patient ID: Amy [name], female   DOB: [birth year], 59 y.o.   MRN: [id number] Complaint:  Visit Type: Patient returns to my office for continued preventative foot care services. Complaint: Patient states" my nails have grown long and thick and become painful to walk and wear shoes" Patient has been diagnosed with DM with neuropathy.. The patient presents for preventative foot care services. No changes to ROS  Podiatric Exam: Vascular: dorsalis pedis and posterior tibial pulses are palpable bilateral. Capillary return is immediate. Temperature gradient is WNL. Skin turgor WNL  Sensorium: Normal Semmes Weinstein monofilament test. Normal tactile sensation bilaterally. Nail Exam: Pt has thick disfigured discolored nails with subungual debris noted bilateral entire nail hallux through fifth toenails Ulcer Exam: There is no evidence of ulcer or pre-ulcerative changes or infection. Orthopedic Exam: Muscle tone and strength are WNL. No limitations in general ROM. No crepitus or effusions noted. Foot type and digits show no abnormalities. Bony prominences are unremarkable. Skin: No Porokeratosis. No infection or ulcers.  Asymptomatic plantar fibroma left arch. Callus heels  B/L.  Diagnosis:  Onychomycosis, , Pain in right toe, pain in left toes  Treatment & Plan Procedures and Treatment: Consent by patient was obtained for treatment procedures. The patient understood the discussion of treatment and procedures well. All questions were answered thoroughly reviewed. Debridement of mycotic and hypertrophic toenails, 1 through 5 bilateral and clearing of subungual debris. No ulceration, no infection noted.  Return Visit-Office Procedure: Patient instructed to return to the office for a follow up visit 10 weeks  for continued evaluation and treatment.    Gardiner Barefoot DPM

## 2018-01-24 NOTE — Assessment & Plan Note (Signed)
History of hyperlipidemia on statin therapy. 

## 2018-01-24 NOTE — Patient Instructions (Signed)

## 2018-01-24 NOTE — Assessment & Plan Note (Signed)
Chronic diastolic heart failure with recent echo performed 07/06/2015 revealing normal LV systolic function and grade 2 diastolic dysfunction.  She is on twice daily torsemide and as needed metolazone.  Her weight is down 9 pounds since I saw her in November.  She does admit to dietary indiscretion.

## 2018-01-24 NOTE — Assessment & Plan Note (Signed)
Essential hypertension her blood pressure measured at 110/70.  She is on no antihypertensive medications.

## 2018-01-24 NOTE — Progress Notes (Signed)
01/24/2018 Amy [name]   08-11-1960  [id number]  Primary Physician Velna Hatchet, MD Primary Cardiologist: Lorretta Harp MD Amy [name], [place], [place]  HPI:  Amy [name] is a 57 y.o.  moderately overweight single African-American female mother of 2 children referred by Dr. Ardeth [name] for cardiovascular evaluation because of weight gain and edema. I last saw her in the office  04/04/2017.  She is accompanied by her Amy [name] today. She had previously seen Dr Amy [name] nfor cardiovascular treatment. She has a history of essential hypertension, diabetes and hyperlipidemia. She has obstructive sleep apnea on sleep As well as diastolic heart failure. She had a negative Myoview 10/10/14 and a 2-D echo performed 07/06/15 that revealed normal LV function and grade 2 diastolic dysfunction. She does have diabetes with complications including blindness for the last 18 years and peripheral neuropathy. She says that she does not eat salt. She's gained 10 or 12 pounds in the last 6 months has had 2 ER visits for lower extremity edema requiring supplemental furosemide.I did change her diuretics to torsemideTID. Her renal function is fairly stable.   When I saw her a year ago I placed her on torsemide 3 times daily.  She has lost 9 pounds as I saw her and is now on torsemide twice daily with as needed metolazone.  She does admit to dietary indiscretion with regards to salt.    Current Meds  Medication Sig  . albuterol (PROVENTIL HFA;VENTOLIN HFA) 108 (90 BASE) MCG/ACT inhaler Inhale 2 puffs into the lungs every 4 (four) hours as needed for wheezing or shortness of breath. For short  . blood glucose meter kit and supplies KIT Prodigy Autocode glucose meter. Dispense based on patient and insurance preference. Use up to four times daily as directed. (FOR ICD-9 250.00, 250.01).  . cetirizine (ZYRTEC) 10 MG tablet Take 10 mg by mouth daily.   Marland Kitchen esomeprazole (NEXIUM) 40 MG capsule Take 40 mg by mouth  daily at 12 noon.   . fluticasone (FLONASE) 50 MCG/ACT nasal spray Place into both nostrils daily.   Marland Kitchen FML FORTE 0.25 % ophthalmic suspension   . HUMALOG KWIKPEN 100 UNIT/ML KiwkPen Inject into the skin 3 (three) times daily. 12-12-17 take 8 units in morning, 10 units at lunch, 14 units at dinner  . Insulin Degludec (TRESIBA FLEXTOUCH) 200 UNIT/ML SOPN Inject 60 Units into the skin at bedtime.   Marland Kitchen levothyroxine (SYNTHROID, LEVOTHROID) 150 MCG tablet Take 150 mcg by mouth daily before breakfast.  . Linaclotide (LINZESS) 145 MCG CAPS capsule Take 145 mcg by mouth daily as needed (constipation).   . metolazone (ZAROXOLYN) 5 MG tablet TAKE 1 TABLET BY MOUTH ONCE DAILY AS NEEDED. TAKE FOR WEIGHT GAIN OF 3 LBS OVERNIGHT OR 5LBS IN A WEEK  . potassium chloride SA (K-DUR,KLOR-CON) 20 MEQ tablet Take 2 tablets (40 mEq total) by mouth daily.  . pregabalin (LYRICA) 150 MG capsule   . pregabalin (LYRICA) 75 MG capsule Take 1 capsule (75 mg total) by mouth 3 (three) times daily.  . simvastatin (ZOCOR) 20 MG tablet Take 20 mg by mouth daily.  Marland Kitchen spironolactone (ALDACTONE) 25 MG tablet Take 0.5 tablets (12.5 mg total) by mouth daily.  Marland Kitchen torsemide (DEMADEX) 20 MG tablet Take 2 tablets (40 mg total) by mouth daily.  Marland Kitchen triamcinolone cream (KENALOG) 0.1 % Apply 1 application topically 2 (two) times daily.  . [DISCONTINUED] insulin glargine (LANTUS) 100 UNIT/ML injection Inject 0.4 mLs (40 Units total) into the skin  at bedtime.     Allergies  Allergen Reactions  . Penicillins Hives and Swelling    Has patient had a PCN reaction causing immediate rash, facial/tongue/throat swelling, SOB or lightheadedness with hypotension: yes- face swelling Has patient had a PCN reaction causing severe rash involving mucus membranes or skin necrosis: no Has patient had a PCN reaction that required hospitalization unknown (childhood allergy) Has patient had a PCN reaction occurring within the last 10 years: no If all of the above  answers are "NO", then may proceed with Cephalosporin use.   . Codeine Nausea Only  . Iodine-131 Hives    hives  . Iohexol Hives and Rash    pt developed itching and hives along with nasal congestion; needs 13 hour premeds for future studies, Onset Date: [id number]   Code: HIVES, Desc: pt developed itching and hives along with nasal congestion; needs 13 hour premeds for future studies, Onset Date: [id number]  Code: HIVES, Desc: pt developed itching and hives along with nasal congestion; needs 13 hour premeds for future studies, Onset Date: [id number]   . Lisinopril Cough  . Sulfa Antibiotics Nausea And Vomiting    Social History   Socioeconomic History  . Marital status: Single    Spouse name: Not on file  . Number of children: 2  . Years of education: 38  . Highest education level: Not on file  Occupational History  . Occupation: N/A    Comment: disabled  Social Needs  . Financial resource strain: Not on file  . Food insecurity:    Worry: Not on file    Inability: Not on file  . Transportation needs:    Medical: Not on file    Non-medical: Not on file  Tobacco Use  . Smoking status: Former Smoker    Types: Cigarettes    Last attempt to quit: 05/22/1992    Years since quitting: 25.6  . Smokeless tobacco: Never Used  Substance and Sexual Activity  . Alcohol use: No    Comment: quit 20 yrs ago  . Drug use: No  . Sexual activity: Not on file  Lifestyle  . Physical activity:    Days per week: Not on file    Minutes per session: Not on file  . Stress: Not on file  Relationships  . Social connections:    Talks on phone: Not on file    Gets together: Not on file    Attends religious service: Not on file    Active member of club or organization: Not on file    Attends meetings of clubs or organizations: Not on file    Relationship status: Not on file  . Intimate partner violence:    Fear of current or ex partner: Not on file    Emotionally abused: Not on file     Physically abused: Not on file    Forced sexual activity: Not on file  Other Topics Concern  . Not on file  Social History Narrative   Lives alone   caffeine drinks 2 cups of coffee a day, occasional soda      Review of Systems: General: negative for chills, fever, night sweats or weight changes.  Cardiovascular: negative for chest pain, dyspnea on exertion, edema, orthopnea, palpitations, paroxysmal nocturnal dyspnea or shortness of breath Dermatological: negative for rash Respiratory: negative for cough or wheezing Urologic: negative for hematuria Abdominal: negative for nausea, vomiting, diarrhea, bright red blood per rectum, melena, or hematemesis Neurologic: negative for visual changes, syncope, or dizziness  All other systems reviewed and are otherwise negative except as noted above.    Blood pressure 110/70, pulse 80, height '4\' 11"'  (1.499 m), weight 212 lb 3.2 oz (96.3 kg).  General appearance: alert and no distress Neck: no adenopathy, no carotid bruit, no JVD, supple, symmetrical, trachea midline and thyroid not enlarged, symmetric, no tenderness/mass/nodules Lungs: clear to auscultation bilaterally Heart: regular rate and rhythm, S1, S2 normal, no murmur, click, rub or gallop Extremities: 1+ pitting edema bilaterally Pulses: 2+ and symmetric Skin: Skin color, texture, turgor normal. No rashes or lesions Neurologic: Alert and oriented X 3, normal strength and tone. Normal symmetric reflexes. Normal coordination and gait  EKG not performed today  ASSESSMENT AND PLAN:   Hypertension Essential hypertension her blood pressure measured at 110/70.  She is on no antihypertensive medications.  Chronic diastolic heart failure (HCC) Chronic diastolic heart failure with recent echo performed 07/06/2015 revealing normal LV systolic function and grade 2 diastolic dysfunction.  She is on twice daily torsemide and as needed metolazone.  Her weight is down 9 pounds since I saw her in  November.  She does admit to dietary indiscretion.  Hyperlipidemia LDL goal <70 History of hyperlipidemia on statin therapy      Lorretta Harp MD [facility], [facility] 01/24/2018 11:35 AM

## 2018-02-09 ENCOUNTER — Ambulatory Visit (INDEPENDENT_AMBULATORY_CARE_PROVIDER_SITE_OTHER): Payer: Medicare Other | Admitting: Podiatry

## 2018-02-09 DIAGNOSIS — M79676 Pain in unspecified toe(s): Secondary | ICD-10-CM | POA: Diagnosis not present

## 2018-02-09 DIAGNOSIS — Z794 Long term (current) use of insulin: Secondary | ICD-10-CM

## 2018-02-09 DIAGNOSIS — E1140 Type 2 diabetes mellitus with diabetic neuropathy, unspecified: Secondary | ICD-10-CM

## 2018-02-09 DIAGNOSIS — B351 Tinea unguium: Secondary | ICD-10-CM | POA: Diagnosis not present

## 2018-02-09 DIAGNOSIS — L608 Other nail disorders: Secondary | ICD-10-CM

## 2018-02-09 NOTE — Progress Notes (Signed)
Subjective:  Patient ID: Amy [name], female    DOB: 1960-10-18,  MRN: [id number]  Chief Complaint  Patient presents with  . Nail Problem    Possible ingrown toenail on 1st toe of both feet  . Foot Problem    Wants to discuss callouses    57 y.o. female presents  for diabetic foot care. Last AMBS was 104. Last A1c "9" something. Last saw PCP last month  (Dr. Ardeth Perfect). Reports numbness and tingling in their feet. Denies cramping in legs and thighs.  Review of Systems: Negative except as noted in the HPI. Denies N/V/F/Ch.  Past Medical History:  Diagnosis Date  . Arthritis   . Asthma   . Blind   . Blindness and low vision    left eye glass eye,  legally blind in right eye  . Diabetes mellitus   . Diabetic neuropathy ([place])   . Hyperlipidemia   . Hypertension   . Hypothyroidism   . Spinal stenosis     Current Outpatient Medications:  .  albuterol (PROVENTIL HFA;VENTOLIN HFA) 108 (90 BASE) MCG/ACT inhaler, Inhale 2 puffs into the lungs every 4 (four) hours as needed for wheezing or shortness of breath. For short, Disp: , Rfl:  .  blood glucose meter kit and supplies KIT, Prodigy Autocode glucose meter. Dispense based on patient and insurance preference. Use up to four times daily as directed. (FOR ICD-9 250.00, 250.01)., Disp: 1 each, Rfl: 0 .  cetirizine (ZYRTEC) 10 MG tablet, Take 10 mg by mouth daily. , Disp: , Rfl:  .  esomeprazole (NEXIUM) 40 MG capsule, Take 40 mg by mouth daily at 12 noon. , Disp: , Rfl:  .  fluticasone (FLONASE) 50 MCG/ACT nasal spray, Place into both nostrils daily. , Disp: , Rfl:  .  FML FORTE 0.25 % ophthalmic suspension, , Disp: , Rfl:  .  HUMALOG KWIKPEN 100 UNIT/ML KiwkPen, Inject into the skin 3 (three) times daily. 12-12-17 take 8 units in morning, 10 units at lunch, 14 units at dinner, Disp: , Rfl:  .  Insulin Degludec (TRESIBA FLEXTOUCH) 200 UNIT/ML SOPN, Inject 60 Units into the skin at bedtime. , Disp: , Rfl:  .  levothyroxine (SYNTHROID,  LEVOTHROID) 150 MCG tablet, Take 150 mcg by mouth daily before breakfast., Disp: , Rfl:  .  Linaclotide (LINZESS) 145 MCG CAPS capsule, Take 145 mcg by mouth daily as needed (constipation). , Disp: , Rfl:  .  metolazone (ZAROXOLYN) 5 MG tablet, TAKE 1 TABLET BY MOUTH ONCE DAILY AS NEEDED. TAKE FOR WEIGHT GAIN OF 3 LBS OVERNIGHT OR 5LBS IN A WEEK, Disp: 30 tablet, Rfl: 1 .  potassium chloride SA (K-DUR,KLOR-CON) 20 MEQ tablet, Take 2 tablets (40 mEq total) by mouth daily., Disp: 60 tablet, Rfl: 3 .  pregabalin (LYRICA) 150 MG capsule, , Disp: , Rfl:  .  pregabalin (LYRICA) 75 MG capsule, Take 1 capsule (75 mg total) by mouth 3 (three) times daily., Disp: 90 capsule, Rfl: 0 .  simvastatin (ZOCOR) 20 MG tablet, Take 20 mg by mouth daily., Disp: , Rfl:  .  spironolactone (ALDACTONE) 25 MG tablet, Take 0.5 tablets (12.5 mg total) by mouth daily., Disp: 90 tablet, Rfl: 3 .  torsemide (DEMADEX) 20 MG tablet, Take 2 tablets (40 mg total) by mouth daily., Disp: 60 tablet, Rfl: 11 .  triamcinolone cream (KENALOG) 0.1 %, Apply 1 application topically 2 (two) times daily., Disp: , Rfl:   Social History   Tobacco Use  Smoking Status Former Smoker  .  Types: Cigarettes  . Last attempt to quit: 05/22/1992  . Years since quitting: 25.7  Smokeless Tobacco Never Used    Allergies  Allergen Reactions  . Penicillins Hives and Swelling    Has patient had a PCN reaction causing immediate rash, facial/tongue/throat swelling, SOB or lightheadedness with hypotension: yes- face swelling Has patient had a PCN reaction causing severe rash involving mucus membranes or skin necrosis: no Has patient had a PCN reaction that required hospitalization unknown (childhood allergy) Has patient had a PCN reaction occurring within the last 10 years: no If all of the above answers are "NO", then may proceed with Cephalosporin use.   . Codeine Nausea Only  . Iodine-131 Hives    hives  . Iohexol Hives and Rash    pt developed  itching and hives along with nasal congestion; needs 13 hour premeds for future studies, Onset Date: [id number]   Code: HIVES, Desc: pt developed itching and hives along with nasal congestion; needs 13 hour premeds for future studies, Onset Date: [id number]  Code: HIVES, Desc: pt developed itching and hives along with nasal congestion; needs 13 hour premeds for future studies, Onset Date: [id number]   . Lisinopril Cough  . Sulfa Antibiotics Nausea And Vomiting   Objective:  There were no vitals filed for this visit. There is no height or weight on file to calculate BMI. Constitutional Well developed. Well nourished.  Vascular Dorsalis pedis pulses present 1+ bilaterally  Posterior tibial pulses present 1+ bilaterally  Pedal hair growth diminished. Capillary refill normal to all digits.  No cyanosis or clubbing noted.  Neurologic Normal speech. Oriented to person, place, and time. Epicritic sensation to light touch grossly present bilaterally. Protective sensation with 5.07 monofilament  present bilaterally. Vibratory sensation present bilaterally.  Dermatologic Nails elongated, thickened, dystrophic. Pincer nails deformity bilat, worst on right 2nd/3rd toes. No open wounds. No skin lesions.  Orthopedic: Normal joint ROM without pain or crepitus bilaterally. No visible deformities. No bony tenderness.   Assessment:   1. Type 2 diabetes mellitus with diabetic neuropathy, with long-term current use of insulin (HCC)   2. Pain due to onychomycosis of toenail   3. Pincer nail deformity    Plan:  Patient was evaluated and treated and all questions answered.  Diabetes without complication, Onychomycosis with pain, pincer nails -Educated on diabetic footcare. Diabetic risk level 0 -Nails x10 debrided sharply and manually with large nail nipper and rotary burr.  -Discussed removal of the right 2nd/3rd toenails due to dystrophy and pain. As patient is higher risk will plan for removal of the  right second toe first before considering multiple nails.   Procedure: Nail Debridement Rationale: neuropathy. Type of Debridement: manual, sharp debridement. Instrumentation: Nail nipper, rotary burr. Number of Nails: 10  Return in about 2 weeks (around 02/23/2018) for Toenail removal right 2nd toe.

## 2018-02-13 ENCOUNTER — Ambulatory Visit
Admission: RE | Admit: 2018-02-13 | Discharge: 2018-02-13 | Disposition: A | Discharge status: Home / Self Care | Payer: Medicare Other | Source: Ambulatory Visit | Attending: Internal Medicine | Admitting: Internal Medicine

## 2018-02-13 DIAGNOSIS — Z1231 Encounter for screening mammogram for malignant neoplasm of breast: Secondary | ICD-10-CM

## 2018-02-23 ENCOUNTER — Ambulatory Visit: Payer: Medicare Other | Admitting: Podiatry

## 2018-03-04 IMAGING — CR DG CHEST 2V
2 series · 2 of 2 positions shown · non-contrast
Comparison: 09/15/2015

CLINICAL DATA: Hypotension.  History of asthma and diabetes.

EXAM:
CHEST  2 VIEW

[w chest lat]
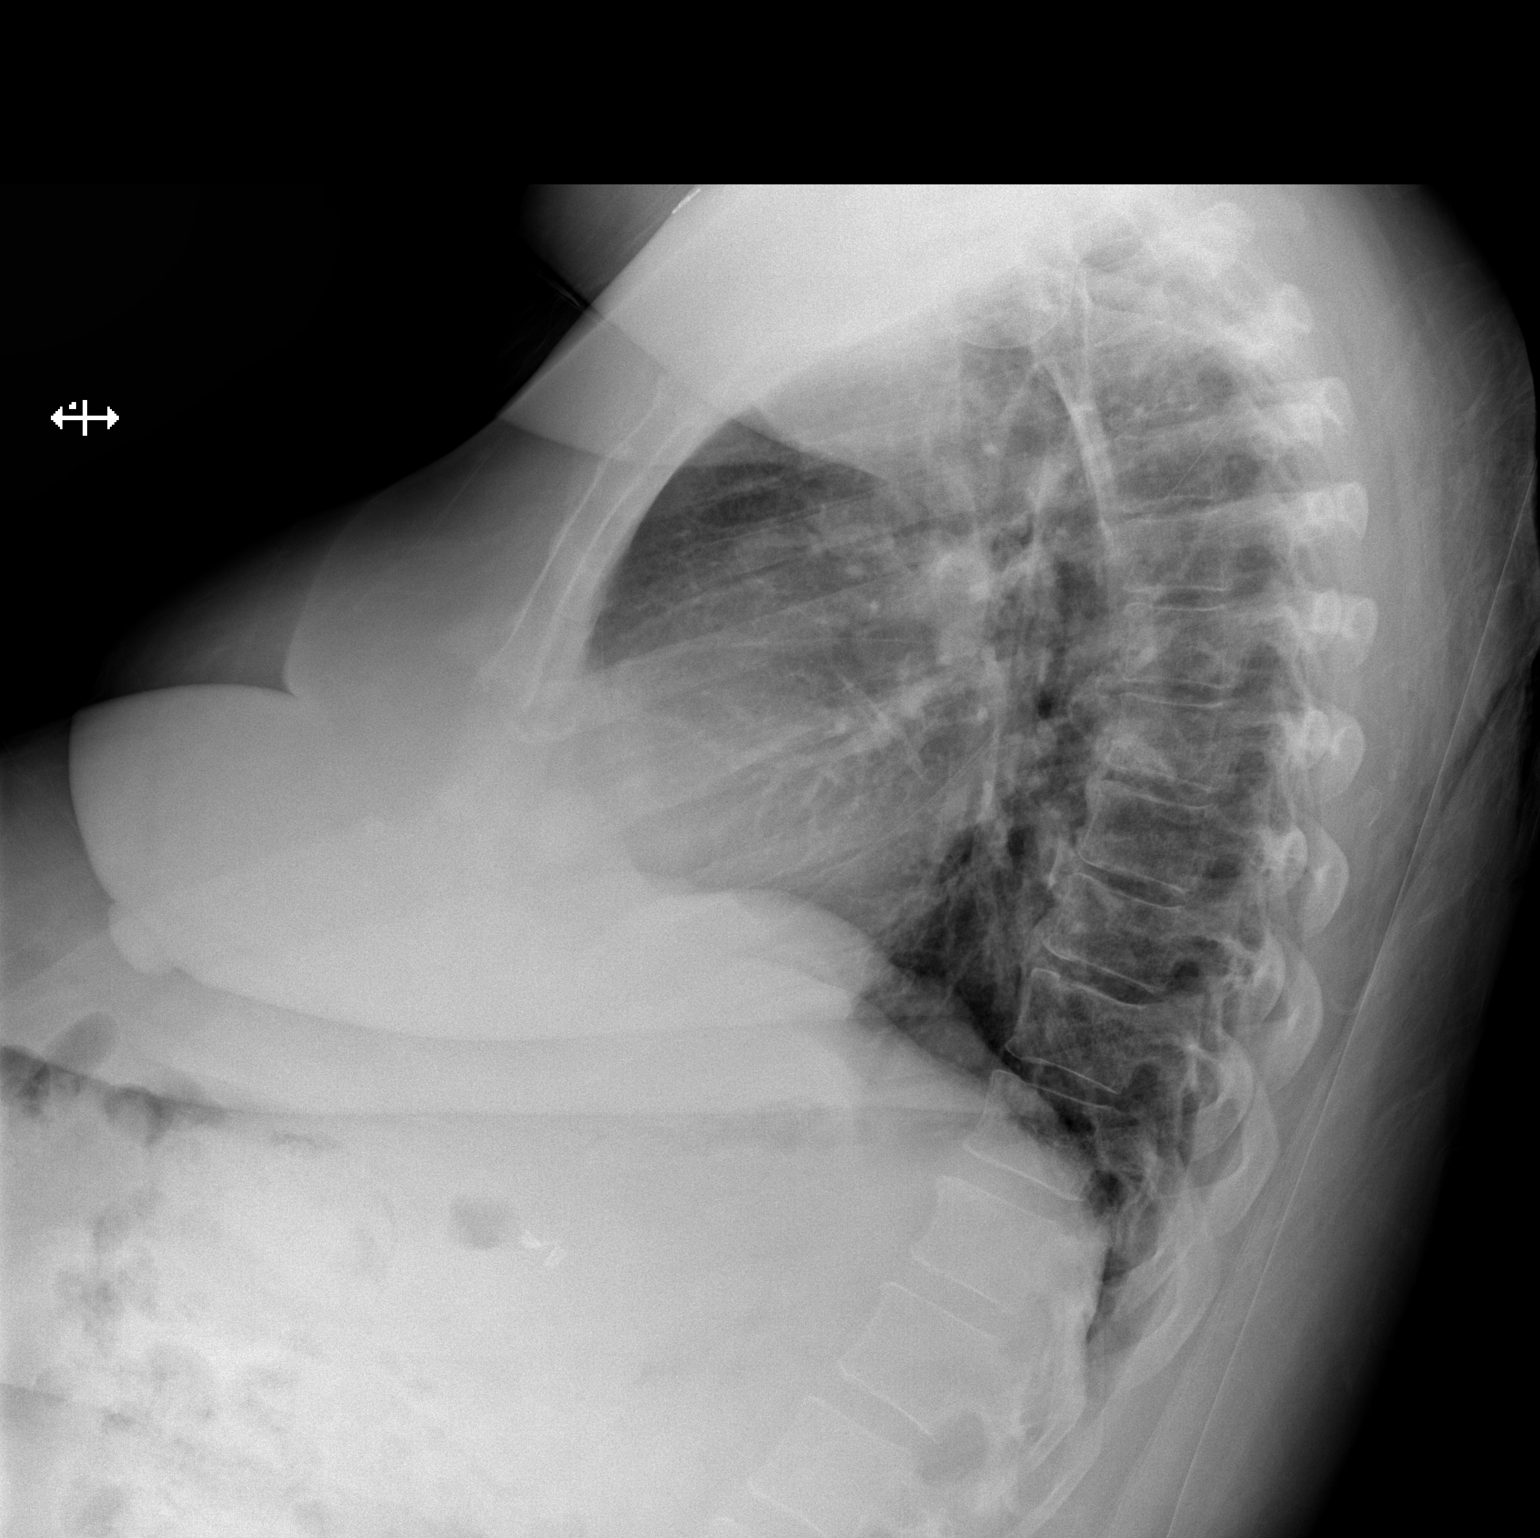

[x chest ap]
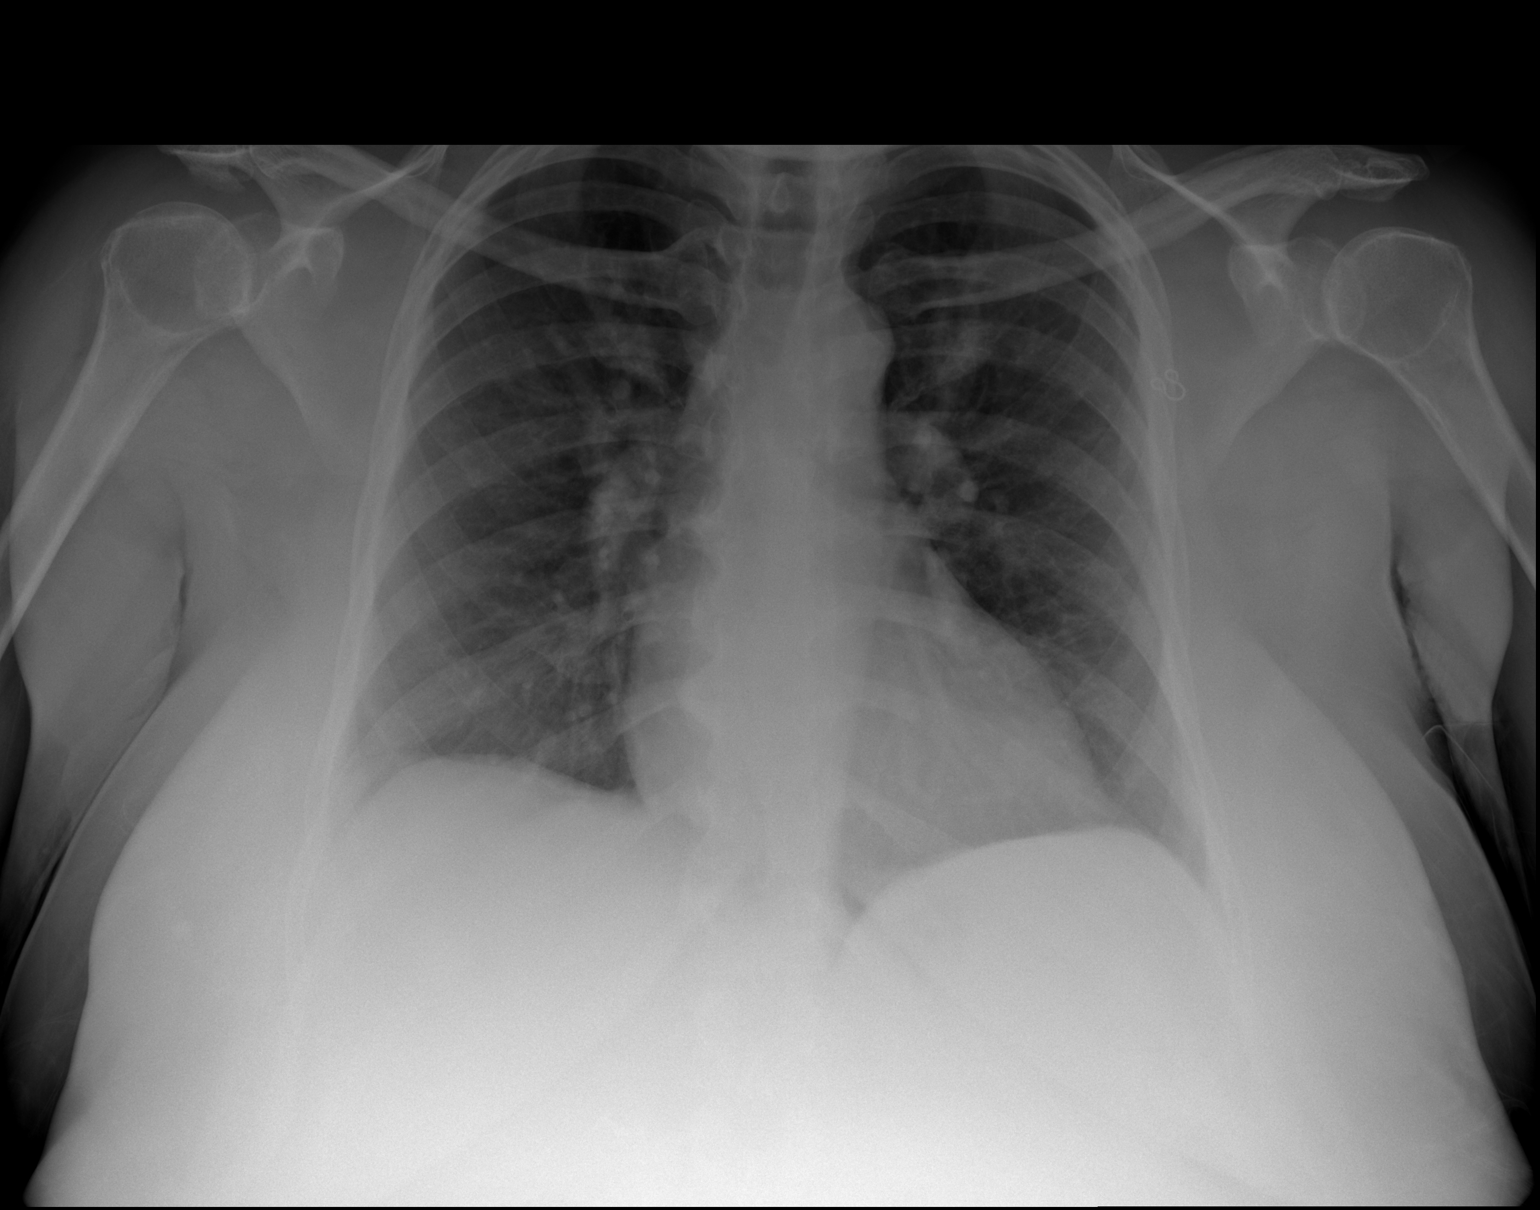

[2 of 2 positions shown; findings below may reference images not displayed]

FINDINGS: Thoracic spondylosis. Heart size within normal limits for
projection. Mild upper zone pulmonary vascular prominence similar to
prior, but this is nonspecific due due semi erect positioning.

No airspace opacity identified.  No pleural effusion.
IMPRESSION: 1. Upper zone pulmonary vascular prominence is probably due to semi
erect positioning rather than pulmonary venous hypertension. There
is no cardiomegaly or edema.

## 2018-03-05 ENCOUNTER — Other Ambulatory Visit: Payer: Self-pay

## 2018-03-05 ENCOUNTER — Encounter (HOSPITAL_COMMUNITY): Payer: Self-pay | Admitting: Emergency Medicine

## 2018-03-05 DIAGNOSIS — E1122 Type 2 diabetes mellitus with diabetic chronic kidney disease: Secondary | ICD-10-CM | POA: Insufficient documentation

## 2018-03-05 DIAGNOSIS — M4800 Spinal stenosis, site unspecified: Secondary | ICD-10-CM | POA: Insufficient documentation

## 2018-03-05 DIAGNOSIS — Z888 Allergy status to other drugs, medicaments and biological substances status: Secondary | ICD-10-CM | POA: Diagnosis not present

## 2018-03-05 DIAGNOSIS — Z7982 Long term (current) use of aspirin: Secondary | ICD-10-CM | POA: Diagnosis not present

## 2018-03-05 DIAGNOSIS — Z79899 Other long term (current) drug therapy: Secondary | ICD-10-CM | POA: Diagnosis not present

## 2018-03-05 DIAGNOSIS — E1121 Type 2 diabetes mellitus with diabetic nephropathy: Secondary | ICD-10-CM | POA: Diagnosis not present

## 2018-03-05 DIAGNOSIS — I5032 Chronic diastolic (congestive) heart failure: Secondary | ICD-10-CM | POA: Diagnosis not present

## 2018-03-05 DIAGNOSIS — Z880 Allergy status to penicillin: Secondary | ICD-10-CM | POA: Diagnosis not present

## 2018-03-05 DIAGNOSIS — Z87891 Personal history of nicotine dependence: Secondary | ICD-10-CM | POA: Insufficient documentation

## 2018-03-05 DIAGNOSIS — E785 Hyperlipidemia, unspecified: Secondary | ICD-10-CM | POA: Diagnosis not present

## 2018-03-05 DIAGNOSIS — M549 Dorsalgia, unspecified: Secondary | ICD-10-CM | POA: Diagnosis present

## 2018-03-05 DIAGNOSIS — E039 Hypothyroidism, unspecified: Secondary | ICD-10-CM | POA: Diagnosis not present

## 2018-03-05 DIAGNOSIS — M1990 Unspecified osteoarthritis, unspecified site: Secondary | ICD-10-CM | POA: Diagnosis not present

## 2018-03-05 DIAGNOSIS — E11649 Type 2 diabetes mellitus with hypoglycemia without coma: Secondary | ICD-10-CM | POA: Insufficient documentation

## 2018-03-05 DIAGNOSIS — N183 Chronic kidney disease, stage 3 (moderate): Secondary | ICD-10-CM | POA: Insufficient documentation

## 2018-03-05 DIAGNOSIS — Z794 Long term (current) use of insulin: Secondary | ICD-10-CM | POA: Insufficient documentation

## 2018-03-05 DIAGNOSIS — Z79890 Hormone replacement therapy (postmenopausal): Secondary | ICD-10-CM | POA: Diagnosis not present

## 2018-03-05 DIAGNOSIS — K529 Noninfective gastroenteritis and colitis, unspecified: Secondary | ICD-10-CM | POA: Diagnosis not present

## 2018-03-05 DIAGNOSIS — G4733 Obstructive sleep apnea (adult) (pediatric): Secondary | ICD-10-CM | POA: Insufficient documentation

## 2018-03-05 DIAGNOSIS — E1143 Type 2 diabetes mellitus with diabetic autonomic (poly)neuropathy: Secondary | ICD-10-CM | POA: Diagnosis not present

## 2018-03-05 DIAGNOSIS — I130 Hypertensive heart and chronic kidney disease with heart failure and stage 1 through stage 4 chronic kidney disease, or unspecified chronic kidney disease: Secondary | ICD-10-CM | POA: Diagnosis not present

## 2018-03-05 DIAGNOSIS — H547 Unspecified visual loss: Secondary | ICD-10-CM | POA: Diagnosis not present

## 2018-03-05 DIAGNOSIS — Z882 Allergy status to sulfonamides status: Secondary | ICD-10-CM | POA: Insufficient documentation

## 2018-03-05 DIAGNOSIS — Z885 Allergy status to narcotic agent status: Secondary | ICD-10-CM | POA: Diagnosis not present

## 2018-03-05 DIAGNOSIS — E870 Hyperosmolality and hypernatremia: Secondary | ICD-10-CM | POA: Insufficient documentation

## 2018-03-05 NOTE — ED Triage Notes (Signed)
Pt presents by EMS for back pain in lower back on right side since yesterday after attempting getting into someone's vehicle. Pt denies any urinary symptoms or radiation into leg.

## 2018-03-06 ENCOUNTER — Observation Stay (HOSPITAL_COMMUNITY)
Admission: EM | Admit: 2018-03-06 | Discharge: 2018-03-07 | Disposition: A | Discharge status: Home / Self Care | Payer: Medicare Other | Attending: Family Medicine | Admitting: Family Medicine

## 2018-03-06 ENCOUNTER — Emergency Department (HOSPITAL_COMMUNITY): Payer: Medicare Other

## 2018-03-06 DIAGNOSIS — E870 Hyperosmolality and hypernatremia: Secondary | ICD-10-CM | POA: Diagnosis present

## 2018-03-06 DIAGNOSIS — K529 Noninfective gastroenteritis and colitis, unspecified: Secondary | ICD-10-CM | POA: Diagnosis not present

## 2018-03-06 DIAGNOSIS — I5032 Chronic diastolic (congestive) heart failure: Secondary | ICD-10-CM | POA: Diagnosis present

## 2018-03-06 DIAGNOSIS — R112 Nausea with vomiting, unspecified: Secondary | ICD-10-CM | POA: Diagnosis present

## 2018-03-06 DIAGNOSIS — E785 Hyperlipidemia, unspecified: Secondary | ICD-10-CM | POA: Diagnosis not present

## 2018-03-06 DIAGNOSIS — E039 Hypothyroidism, unspecified: Secondary | ICD-10-CM | POA: Diagnosis present

## 2018-03-06 DIAGNOSIS — S39012A Strain of muscle, fascia and tendon of lower back, initial encounter: Secondary | ICD-10-CM

## 2018-03-06 DIAGNOSIS — M549 Dorsalgia, unspecified: Secondary | ICD-10-CM | POA: Diagnosis not present

## 2018-03-06 DIAGNOSIS — E1149 Type 2 diabetes mellitus with other diabetic neurological complication: Secondary | ICD-10-CM | POA: Diagnosis present

## 2018-03-06 DIAGNOSIS — I10 Essential (primary) hypertension: Secondary | ICD-10-CM

## 2018-03-06 DIAGNOSIS — E1121 Type 2 diabetes mellitus with diabetic nephropathy: Secondary | ICD-10-CM | POA: Diagnosis present

## 2018-03-06 HISTORY — DX: Nausea with vomiting, unspecified: R11.2

## 2018-03-06 LAB — CBC WITH DIFFERENTIAL/PLATELET
ABS IMMATURE GRANULOCYTES: 0.03 10*3/uL (ref 0.00–0.07)
BASOS ABS: 0 10*3/uL (ref 0.0–0.1)
BASOS PCT: 0 %
EOS ABS: 0.2 10*3/uL (ref 0.0–0.5)
Eosinophils Relative: 2 %
HEMATOCRIT: 40.6 % (ref 36.0–46.0)
Hemoglobin: 12.6 g/dL (ref 12.0–15.0)
Immature Granulocytes: 0 %
LYMPHS ABS: 2.4 10*3/uL (ref 0.7–4.0)
Lymphocytes Relative: 29 %
MCH: 25.5 pg — ABNORMAL LOW (ref 26.0–34.0)
MCHC: 31 g/dL (ref 30.0–36.0)
MCV: 82 fL (ref 80.0–100.0)
MONOS PCT: 12 %
Monocytes Absolute: 1 10*3/uL (ref 0.1–1.0)
NEUTROS ABS: 4.7 10*3/uL (ref 1.7–7.7)
NEUTROS PCT: 57 %
NRBC: 0 % (ref 0.0–0.2)
PLATELETS: 166 10*3/uL (ref 150–400)
RBC: 4.95 MIL/uL (ref 3.87–5.11)
RDW: 15.5 % (ref 11.5–15.5)
WBC: 8.2 10*3/uL (ref 4.0–10.5)

## 2018-03-06 LAB — URINALYSIS, ROUTINE W REFLEX MICROSCOPIC
BILIRUBIN URINE: NEGATIVE
Glucose, UA: NEGATIVE mg/dL
Hgb urine dipstick: NEGATIVE
Ketones, ur: NEGATIVE mg/dL
Leukocytes, UA: NEGATIVE
NITRITE: NEGATIVE
PROTEIN: NEGATIVE mg/dL
SPECIFIC GRAVITY, URINE: 1.01 (ref 1.005–1.030)
pH: 5 (ref 5.0–8.0)

## 2018-03-06 LAB — COMPREHENSIVE METABOLIC PANEL
ALBUMIN: 3.5 g/dL (ref 3.5–5.0)
ALT: 23 U/L (ref 0–44)
ANION GAP: 9 (ref 5–15)
AST: 23 U/L (ref 15–41)
Alkaline Phosphatase: 98 U/L (ref 38–126)
BUN: 42 mg/dL — ABNORMAL HIGH (ref 6–20)
CO2: 30 mmol/L (ref 22–32)
Calcium: 9 mg/dL (ref 8.9–10.3)
Chloride: 108 mmol/L (ref 98–111)
Creatinine, Ser: 1.54 mg/dL — ABNORMAL HIGH (ref 0.44–1.00)
GFR calc non Af Amer: 36 mL/min — ABNORMAL LOW (ref >60)
GFR, EST AFRICAN AMERICAN: 42 mL/min — AB (ref >60)
GLUCOSE: 100 mg/dL — AB (ref 70–99)
Potassium: 3.6 mmol/L (ref 3.5–5.1)
SODIUM: 147 mmol/L — AB (ref 135–145)
Total Bilirubin: 0.4 mg/dL (ref 0.3–1.2)
Total Protein: 7.9 g/dL (ref 6.5–8.1)

## 2018-03-06 LAB — GLUCOSE, CAPILLARY: GLUCOSE-CAPILLARY: 126 mg/dL — AB (ref 70–99)

## 2018-03-06 MED ORDER — LORAZEPAM 2 MG/ML IJ SOLN
0.5000 mg | Freq: Once | INTRAMUSCULAR | Status: AC
  Administered 2018-03-06: 0.5 mg via INTRAVENOUS
  Filled 2018-03-06: qty 1

## 2018-03-06 MED ORDER — LIDOCAINE 5 % EX PTCH
1.0000 | MEDICATED_PATCH | CUTANEOUS | Status: DC
  Administered 2018-03-06 – 2018-03-07 (×2): 1 via TRANSDERMAL
  Filled 2018-03-06 (×2): qty 1

## 2018-03-06 MED ORDER — METHOCARBAMOL 500 MG PO TABS
1000.0000 mg | ORAL_TABLET | Freq: Once | ORAL | Status: AC
  Administered 2018-03-06: 1000 mg via ORAL
  Filled 2018-03-06: qty 2

## 2018-03-06 MED ORDER — ONDANSETRON HCL 4 MG/2ML IJ SOLN: 4.0000 mg | Freq: Four times a day (QID) | INTRAMUSCULAR | Status: DC | PRN

## 2018-03-06 MED ORDER — ACETAMINOPHEN 325 MG PO TABS
650.0000 mg | ORAL_TABLET | Freq: Once | ORAL | Status: AC
  Administered 2018-03-06: 650 mg via ORAL
  Filled 2018-03-06: qty 2

## 2018-03-06 MED ORDER — METOCLOPRAMIDE HCL 5 MG PO TABS
5.0000 mg | ORAL_TABLET | Freq: Three times a day (TID) | ORAL | Status: DC
  Administered 2018-03-06 – 2018-03-07 (×4): 5 mg via ORAL
  Filled 2018-03-06 (×4): qty 1

## 2018-03-06 MED ORDER — SPIRONOLACTONE 12.5 MG HALF TABLET
12.5000 mg | ORAL_TABLET | Freq: Every day | ORAL | Status: DC
  Administered 2018-03-06 – 2018-03-07 (×2): 12.5 mg via ORAL
  Filled 2018-03-06 (×2): qty 1

## 2018-03-06 MED ORDER — KETOROLAC TROMETHAMINE 15 MG/ML IJ SOLN
15.0000 mg | Freq: Three times a day (TID) | INTRAMUSCULAR | Status: DC | PRN
  Administered 2018-03-06 – 2018-03-07 (×2): 15 mg via INTRAVENOUS
  Filled 2018-03-06 (×2): qty 1

## 2018-03-06 MED ORDER — MORPHINE SULFATE (PF) 4 MG/ML IV SOLN
6.0000 mg | Freq: Once | INTRAVENOUS | Status: AC
  Administered 2018-03-06: 6 mg via INTRAVENOUS
  Filled 2018-03-06: qty 2

## 2018-03-06 MED ORDER — ONDANSETRON HCL 4 MG/2ML IJ SOLN
4.0000 mg | Freq: Once | INTRAMUSCULAR | Status: AC
  Administered 2018-03-06: 4 mg via INTRAVENOUS
  Filled 2018-03-06: qty 2

## 2018-03-06 MED ORDER — SODIUM CHLORIDE 0.9 % IV BOLUS
1000.0000 mL | Freq: Once | INTRAVENOUS | Status: AC
  Administered 2018-03-06: 1000 mL via INTRAVENOUS

## 2018-03-06 MED ORDER — LEVOTHYROXINE SODIUM 150 MCG PO TABS
150.0000 ug | ORAL_TABLET | Freq: Every day | ORAL | Status: DC
  Administered 2018-03-06 – 2018-03-07 (×2): 150 ug via ORAL
  Filled 2018-03-06 (×2): qty 1
  Filled 2018-03-06: qty 2

## 2018-03-06 MED ORDER — SODIUM CHLORIDE 0.9 % IV BOLUS
250.0000 mL | Freq: Once | INTRAVENOUS | Status: AC
  Administered 2018-03-06: 250 mL via INTRAVENOUS

## 2018-03-06 MED ORDER — MORPHINE SULFATE (PF) 4 MG/ML IV SOLN
4.0000 mg | Freq: Once | INTRAVENOUS | Status: AC
  Administered 2018-03-06: 4 mg via INTRAVENOUS
  Filled 2018-03-06: qty 1

## 2018-03-06 MED ORDER — ASPIRIN EC 81 MG PO TBEC
81.0000 mg | DELAYED_RELEASE_TABLET | Freq: Every day | ORAL | Status: DC
  Administered 2018-03-06 – 2018-03-07 (×2): 81 mg via ORAL
  Filled 2018-03-06 (×2): qty 1

## 2018-03-06 MED ORDER — FLUTICASONE PROPIONATE 50 MCG/ACT NA SUSP
1.0000 | Freq: Every day | NASAL | Status: DC
  Administered 2018-03-06 – 2018-03-07 (×2): 1 via NASAL
  Filled 2018-03-06: qty 16

## 2018-03-06 MED ORDER — LORAZEPAM 2 MG/ML IJ SOLN
1.0000 mg | Freq: Once | INTRAMUSCULAR | Status: AC
  Administered 2018-03-06: 1 mg via INTRAVENOUS
  Filled 2018-03-06: qty 1

## 2018-03-06 MED ORDER — PREGABALIN 50 MG PO CAPS
150.0000 mg | ORAL_CAPSULE | Freq: Three times a day (TID) | ORAL | Status: DC
  Administered 2018-03-06 – 2018-03-07 (×4): 150 mg via ORAL
  Filled 2018-03-06 (×4): qty 3

## 2018-03-06 MED ORDER — ENOXAPARIN SODIUM 40 MG/0.4ML ~~LOC~~ SOLN
40.0000 mg | SUBCUTANEOUS | Status: DC
  Administered 2018-03-06: 40 mg via SUBCUTANEOUS
  Filled 2018-03-06: qty 0.4

## 2018-03-06 MED ORDER — SIMVASTATIN 20 MG PO TABS
20.0000 mg | ORAL_TABLET | Freq: Every day | ORAL | Status: DC
  Administered 2018-03-06 – 2018-03-07 (×2): 20 mg via ORAL
  Filled 2018-03-06 (×2): qty 1

## 2018-03-06 MED ORDER — TORSEMIDE 20 MG PO TABS
40.0000 mg | ORAL_TABLET | Freq: Every day | ORAL | Status: DC
  Administered 2018-03-06 – 2018-03-07 (×2): 40 mg via ORAL
  Filled 2018-03-06 (×2): qty 2

## 2018-03-06 MED ORDER — INSULIN GLARGINE 100 UNIT/ML ~~LOC~~ SOLN
30.0000 [IU] | Freq: Every day | SUBCUTANEOUS | Status: DC
  Administered 2018-03-06: 30 [IU] via SUBCUTANEOUS
  Filled 2018-03-06 (×2): qty 0.3

## 2018-03-06 MED ORDER — SODIUM CHLORIDE 0.9 % IV SOLN: INTRAVENOUS | Status: DC

## 2018-03-06 MED ORDER — ONDANSETRON HCL 4 MG PO TABS: 4.0000 mg | ORAL_TABLET | Freq: Four times a day (QID) | ORAL | Status: DC | PRN

## 2018-03-06 MED ORDER — SODIUM CHLORIDE 0.9 % IV BOLUS
500.0000 mL | Freq: Once | INTRAVENOUS | Status: AC
  Administered 2018-03-06: 500 mL via INTRAVENOUS

## 2018-03-06 MED ORDER — METHOCARBAMOL 1000 MG/10ML IJ SOLN
500.0000 mg | Freq: Three times a day (TID) | INTRAVENOUS | Status: DC | PRN
  Administered 2018-03-07: 500 mg via INTRAVENOUS
  Filled 2018-03-06 (×2): qty 5

## 2018-03-06 MED ORDER — LINACLOTIDE 145 MCG PO CAPS
145.0000 ug | ORAL_CAPSULE | Freq: Every day | ORAL | Status: DC | PRN
  Filled 2018-03-06: qty 1

## 2018-03-06 MED ORDER — PROMETHAZINE HCL 25 MG/ML IJ SOLN: 6.2500 mg | Freq: Four times a day (QID) | INTRAMUSCULAR | Status: DC | PRN

## 2018-03-06 MED ORDER — ALBUTEROL SULFATE (2.5 MG/3ML) 0.083% IN NEBU: 3.0000 mL | INHALATION_SOLUTION | RESPIRATORY_TRACT | Status: DC | PRN

## 2018-03-06 MED ORDER — METOCLOPRAMIDE HCL 5 MG/ML IJ SOLN
5.0000 mg | Freq: Once | INTRAMUSCULAR | Status: AC
  Administered 2018-03-06: 5 mg via INTRAVENOUS
  Filled 2018-03-06: qty 2

## 2018-03-06 MED ORDER — PANTOPRAZOLE SODIUM 40 MG PO TBEC
40.0000 mg | DELAYED_RELEASE_TABLET | Freq: Every day | ORAL | Status: DC
  Administered 2018-03-06 – 2018-03-07 (×2): 40 mg via ORAL
  Filled 2018-03-06 (×2): qty 1

## 2018-03-06 NOTE — Care Management Note (Signed)
Case Management Note  Patient Details  Name: Amy [name] MRN: [id number] Date of Birth: 1960/08/10  CM consulted for an increase in [facility] services.  CM spoke with pt and sister at bedside.  Pt reports she is with [facility].  CM spoke with Georgina Snell with [facility] who advised pt has PCS through [facility] 7 days a week and there has been no missed shift with pt since she was released from [facility].  He reports she has [facility] through [facility].  CM spoke with [facility] to advise of increased [facility] services and that pt was in the ED.  They advised pt also received assistant through [facility].  Faxed new orders.  CM updated Dr. Zenia Resides who reports he will transition pt home when she is more alert.  CM will contact [facility] when pt is leaving for PCS to possibly meet the pt at home.  No further CM needs noted at this time.  Expected Discharge Date:   03/06/2018               Expected Discharge Plan:  [place]  In-House Referral:  Clinical Social Work  Discharge planning Services  CM Consult  Post Acute Care Choice:  Home Health Choice offered to:  Patient  HH Arranged:  RN, PT, OT, PCS/Personal Care Services [place]:  [facility], [facility]  Status of Service:  Completed, signed off  Rae Mar, RN 03/06/2018, 9:29 AM

## 2018-03-06 NOTE — Progress Notes (Signed)
CSW acknowledges consult as pt is unable to walk. CSW spoke with pt's RN and asked that she speak with pt to determine wishes for further placement needs. CSW was advised by RN that pt and sister were both at bedside where pt and sister reported that pt was in a facility about two months ago and only stayed for 2 weeks. RN expressed that pt and sister also reported the desire for pt to be home and get further services in the home. CSW was advised that pt had an aid that came in 7 days a week however that services or person is no longer being provided to pt at this time. Per sister pt was suppose to have a PT appointment in the home today.   CSW advised RN that since pt is not wanting SNF placement and wanting to go home-CSW would speak with RNCM once arrived to speak with family at bedside to establish further needs in the home. At this time plan remains for CSW to update RNCM at [facility] ED for further needs of pt.    Amy [name], MSW, [facility] Clinical Social Worker [phone]

## 2018-03-06 NOTE — ED Provider Notes (Addendum)
[facility] DEPT Provider Note   CSN: [id number] Arrival date & time: 03/05/18  2315     History   Chief Complaint Chief Complaint  Patient presents with  . Back Pain    HPI Amy [name] is a 57 y.o. female.  HPI  57 year old female with history of diabetes, hypertension, hyperlipidemia and spinal stenosis comes in with chief complaint of back pain.  Patient reports that she started having back pain this morning when she woke up.  Over time her pain is significantly worsened and now she is unable to walk.  Patient is blind and because of her pain she was not able to function properly therefore she came to the ER.  Patient denies any history of similar pain in the past.  She is status post cholecystectomy and denies any UTI-like symptoms or history of kidney stones.  Pain is located primarily in the flank region and it is worse with any kind of movement.  She denies any falls or specific event of strain besides trying to get up on a jeep last night.  Past Medical History:  Diagnosis Date  . Arthritis   . Asthma   . Blind   . Blindness and low vision    left eye glass eye,  legally blind in right eye  . Diabetes mellitus   . Diabetic neuropathy ([place])   . Hyperlipidemia   . Hypertension   . Hypothyroidism   . Spinal stenosis     Patient Active Problem List   Diagnosis Date Noted  . Right leg pain 12/19/2017  . Leukocytosis 12/19/2017  . Benign positional vertigo 12/19/2017  . Weakness 12/12/2017  . Obstructive sleep apnea on CPAP 02/09/2017  . Chronic diastolic heart failure ([place]) 11/08/2016  . Near syncope 01/30/2016  . SOB (shortness of breath)   . CAP (community acquired pneumonia) 09/12/2015  . Hypoxia 09/12/2015  . Hypertension 07/05/2015  . Hypotension 07/05/2015  . Diabetes mellitus with neurological manifestations ([place]) 11/04/2014  . Hyperlipidemia LDL goal <70 11/04/2014  . Spinal stenosis, multilevel 11/04/2014  .  Hyperkalemia 11/01/2014  . Hypoglycemia 08/09/2014  . DM (diabetes mellitus), type 2, uncontrolled, with hyperosmolarity ([place]) 08/08/2014  . Elevated troponin 08/08/2014  . Nausea vomiting and diarrhea 08/08/2014  . Dizziness 08/06/2012  . Orthostatic hypotension 08/06/2012  . Hypernatremia 08/06/2012  . Acute gastroenteritis 08/13/2011  . Gastroparesis 08/13/2011  . Chest pain 08/13/2011  . Oral thrush 08/13/2011  . Gastroenteritis 05/23/2011  . Dehydration 05/23/2011  . Blindness 05/23/2011  . DM type 1, not at goal, causing eye disease ([place]) 05/23/2011  . Asthma 05/23/2011  . Diarrhea 05/23/2011  . Vomiting 05/23/2011  . Hypothyroidism 05/23/2011  . Diabetic neuropathy ([place]) 05/23/2011  . Diabetic nephropathy ([place]) 05/23/2011    Past Surgical History:  Procedure Laterality Date  . ABDOMINAL HYSTERECTOMY  1990  . [place]   x 2  . CHOLECYSTECTOMY  2008  . ENUCLEATION Bilateral 09/15/1998  . EYE SURGERY  2016   fitting artificial eye     OB History   None    Obstetric Comments  Pt has two sons.         Home Medications    Prior to Admission medications   Medication Sig Start Date End Date Taking? Authorizing Provider  albuterol (PROVENTIL HFA;VENTOLIN HFA) 108 (90 BASE) MCG/ACT inhaler Inhale 2 puffs into the lungs every 4 (four) hours as needed for wheezing or shortness of breath. For short  Yes [provider]  aspirin EC 81 MG tablet Take 81 mg by mouth daily.   Yes [provider]  blood glucose meter kit and supplies KIT [employer] Autocode glucose meter. Dispense based on patient and insurance preference. Use up to four times daily as directed. (FOR ICD-9 250.00, 250.01). 12/13/17  Yes Dessa Phi, DO  esomeprazole (NEXIUM) 40 MG capsule Take 40 mg by mouth daily at 12 noon.    Yes [provider]  fluticasone (FLONASE) 50 MCG/ACT nasal spray Place 1 spray into both nostrils daily.    Yes [provider]  HUMALOG KWIKPEN 100 UNIT/ML KiwkPen Inject into the skin 3 (three) times daily. 12-12-17 take 8 units in morning, 10 units at lunch, 14 units at dinner 01/24/17  Yes [provider]  Insulin Degludec (TRESIBA FLEXTOUCH) 200 UNIT/ML SOPN Inject 60 Units into the skin at bedtime.    Yes [provider]  levothyroxine (SYNTHROID, LEVOTHROID) 150 MCG tablet Take 150 mcg by mouth daily before breakfast.   Yes [provider]  Linaclotide (LINZESS) 145 MCG CAPS capsule Take 145 mcg by mouth daily as needed (constipation).    Yes [provider]  metolazone (ZAROXOLYN) 5 MG tablet TAKE 1 TABLET BY MOUTH ONCE DAILY AS NEEDED. TAKE FOR WEIGHT GAIN OF 3 LBS OVERNIGHT OR 5LBS IN A WEEK Patient taking differently: Take 5 mg by mouth daily as needed (weight gain of 3lbs overnight or 5 lbs in a week).  11/15/17  Yes Bensimhon, Shaune Pascal, MD  potassium chloride SA (K-DUR,KLOR-CON) 20 MEQ tablet Take 2 tablets (40 mEq total) by mouth daily. 11/06/17  Yes Georgiana Shore, NP  pregabalin (LYRICA) 150 MG capsule Take 150 mg by mouth 3 (three) times daily.  01/01/18  Yes [provider]  simvastatin (ZOCOR) 20 MG tablet Take 20 mg by mouth daily. 11/24/17  Yes [provider]  spironolactone (ALDACTONE) 25 MG tablet Take 0.5 tablets (12.5 mg total) by mouth daily. 10/30/17  Yes Shirley Friar, PA-C  torsemide (DEMADEX) 20 MG tablet Take 2 tablets (40 mg total) by mouth daily. Patient taking differently: Take 40 mg by mouth 2 (two) times daily.  10/24/17  Yes Georgiana Shore, NP  triamcinolone cream (KENALOG) 0.1 % Apply 1 application topically 2 (two) times daily.   Yes [provider]  pregabalin (LYRICA) 75 MG capsule Take 1 capsule (75 mg total) by mouth 3 (three) times daily. Patient not taking: Reported on 03/06/2018 12/13/17 03/06/18  Dessa Phi, DO  insulin glargine (LANTUS) 100 UNIT/ML injection Inject 0.4 mLs (40 Units total) into the skin at  bedtime. 12/18/12 01/24/18  Reyne Dumas, MD    Family History Family History  Problem Relation Age of Onset  . Diabetes Sister   . Glaucoma Sister   . Hypertension Sister   . Stroke Brother   . Heart attack Paternal Aunt   . Breast cancer Neg Hx     Social History Social History   Tobacco Use  . Smoking status: Former Smoker    Types: Cigarettes    Last attempt to quit: 05/22/1992    Years since quitting: 25.8  . Smokeless tobacco: Never Used  Substance Use Topics  . Alcohol use: No    Comment: quit 20 yrs ago  . Drug use: No     Allergies   Penicillins; Codeine; Iodine-131; Iohexol; Lisinopril; and Sulfa antibiotics   Review of Systems Review of Systems  Constitutional: Positive for activity change.  Respiratory: Negative for shortness of breath.   Cardiovascular: Negative for chest pain.  Gastrointestinal: Negative for abdominal pain, nausea and vomiting.  Genitourinary: Positive for flank pain. Negative for dysuria.  Musculoskeletal: Positive for back pain.  All other systems reviewed and are negative.    Physical Exam Updated Vital Signs BP 119/66   Pulse (!) 59   Temp 98.2 F (36.8 C) (Oral)   Resp 15   Ht '4\' 11"'  (1.499 m)   Wt 94.3 kg   SpO2 100%   BMI 42.01 kg/m   Physical Exam  Constitutional: She is oriented to person, place, and time. She appears well-developed.  HENT:  Head: Normocephalic and atraumatic.  Eyes: EOM are normal.  Neck: Normal range of motion. Neck supple.  Cardiovascular: Normal rate.  Pulmonary/Chest: Effort normal.  Abdominal: Bowel sounds are normal. There is no tenderness.  Musculoskeletal:  Patient has reproducible tenderness over the right flank region and right lower posterior lumbar region. Tenderness is worse when patient tries to turn or actively raise her right leg  Neurological: She is alert and oriented to person, place, and time.  Skin: Skin is warm and dry.  Nursing note and vitals reviewed.    ED  Treatments / Results  Labs (all labs ordered are listed, but only abnormal results are displayed) Labs Reviewed  COMPREHENSIVE METABOLIC PANEL - Abnormal; Notable for the following components:      Result Value   Sodium 147 (*)    Glucose, Bld 100 (*)    BUN 42 (*)    Creatinine, Ser 1.54 (*)    GFR calc non Af Amer 36 (*)    GFR calc Af Amer 42 (*)    All other components within normal limits  CBC WITH DIFFERENTIAL/PLATELET - Abnormal; Notable for the following components:   MCH 25.5 (*)    All other components within normal limits  URINALYSIS, ROUTINE W REFLEX MICROSCOPIC    EKG None  Radiology Ct Abdomen Pelvis Wo Contrast  Result Date: 03/06/2018 CLINICAL DATA:  Low back/abdominal and right flank pain. EXAM: CT ABDOMEN AND PELVIS WITHOUT CONTRAST TECHNIQUE: Multidetector CT imaging of the abdomen and pelvis was performed following the standard protocol without IV contrast. COMPARISON:  CT 09/11/2015 FINDINGS: Lower chest: Scattered atelectasis. No pleural fluid or confluent airspace disease. Hepatobiliary: Progressive steatosis from prior exam, advanced. The liver is prominent size. The previous vague low-density in the right hepatic lobe is not as well seen on the current exam. Clips in the gallbladder fossa postcholecystectomy. No biliary dilatation. Pancreas: No ductal dilatation or inflammation. Spleen: Normal in size without focal abnormality. Adrenals/Urinary Tract: Normal adrenal glands. No hydronephrosis. No renal stones. Both ureters are decompressed without stones along the course. Urinary bladder is physiologically distended without stone or wall thickening. Stomach/Bowel: Small hiatal hernia. Stomach distended with ingested contrast. No bowel wall thickening, inflammatory change or obstruction. Normal appendix. Moderate colonic stool burden. Vascular/Lymphatic: Aortic atherosclerosis without aneurysm. No abdominopelvic adenopathy. Reproductive: Status post hysterectomy. No  adnexal masses. Other: No free air or free fluid.  No intra-abdominal abscess. Musculoskeletal: There are no acute or suspicious osseous abnormalities. Facet arthropathy in the lower lumbar spine. Scattered bone islands in the pelvis, unchanged. IMPRESSION: 1. No acute findings.  No renal stone or obstructive uropathy. 2. Progressive hepatic steatosis from prior exam. 3.  Aortic Atherosclerosis (ICD10-I70.0). Electronically Signed   By: Keith Rake M.D.   On: 03/06/2018 06:51    Procedures Procedures (including critical care time)  Medications Ordered  in ED Medications  lidocaine (LIDODERM) 5 % 1 patch (1 patch Transdermal Patch Applied 03/06/18 0356)  sodium chloride 0.9 % bolus 500 mL (has no administration in time range)  morphine 4 MG/ML injection 6 mg (6 mg Intravenous Given 03/06/18 0355)  methocarbamol (ROBAXIN) tablet 1,000 mg (1,000 mg Oral Given 03/06/18 0355)  acetaminophen (TYLENOL) tablet 650 mg (650 mg Oral Given 03/06/18 0718)  ondansetron (ZOFRAN) injection 4 mg (4 mg Intravenous Given 03/06/18 0716)     Initial Impression / Assessment and Plan / ED Course  I have reviewed the triage vital signs and the nursing notes.  Pertinent labs & imaging results that were available during my care of the patient were reviewed by me and considered in my medical decision making (see chart for details).  Clinical Course as of Mar 06 726  Tue Mar 06, 2018  0703 Went in to share the results of the CT scan of the patient. She was coughing and choking when I entered the room..  Patient was supine and he had to get her up.  Since patient sat up she started vomiting.  Will order some Zofran for her nausea.  It is possible that her emesis was posttussive.  There is a small concern for possible aspiration pneumonitis.  X-ray ordered because of her cough.  Patient does not feel comfortable going home.  She does not think she can ambulate.  Case management and social work has been  consulted. Patient's care will be transferred to the incoming team.  CT ABDOMEN PELVIS WO CONTRAST [AN]  0726 MRI of her cervical spine and lumbar spine from earlier in this year reviewed.  None of them had any clinically significant disease noted.   [AN]    Clinical Course User Index [AN] Varney Biles, MD    57 year old female with history of diabetes comes in with chief complaint of flank pain.  She is also status post cholecystectomy.  On exam patient is having no abdominal tenderness, however she has significant tenderness over the right flank and lower lumbar region posteriorly.  There is no specific evoking factor.  She has no UTI-like symptoms.  Patient is in excruciating pain and not able to move secondary to it.  CT scan ordered because of the severe pain with unknown etiology.  If the CT scan is negative then we would presume that her pain is mostly musculoskeletal.  We do not think patient is having acute infarction of her kidney or AAA.  Final Clinical Impressions(s) / ED Diagnoses   Final diagnoses:  None    ED Discharge Orders    None           Varney Biles, MD 03/06/18 [phone]

## 2018-03-06 NOTE — ED Notes (Addendum)
Pt given IV morphine and Ativan, became sleepy.  RN called to room for low oxygen alarm on monitor.  O2 sats were appx 85% on RA.  Pt placed in upright position and applied 3L by nasal cannula.  Pt acknowledges wearing CPAP at night when at home for sleep apnea. Will continue to monitor.

## 2018-03-06 NOTE — Evaluation (Signed)
Physical Therapy Evaluation Patient Details Name: Amy [name] MRN: [id number] DOB: [birth year] Today's Date: 03/06/2018   History of Present Illness  57 year old female with history of diabetes, hypertension, blindness, hyperlipidemia and spinal stenosis comes in with chief complaint of back pain. Pain is R lower back. Pt also having nausea and vomiting.  Clinical Impression  Pt admitted with above diagnosis. Pt currently with functional limitations due to the deficits listed below (see PT Problem List). Moderate assist for supine to sit, pt became dizzy (orthostatics negative) and then had nausea & vomiting while sitting on edge of bed. Pt unable to tolerate further activity so assisted her back into bed. At present she needs 24* assistance. She lives alone and is blind. She reports she's supposed to have daily visits from home health aides but they don't always come.  Pt will benefit from skilled PT to increase their independence and safety with mobility to allow discharge to the venue listed below.       Follow Up Recommendations SNF; 24 hour supervision, assistance for mobility    Equipment Recommendations  None recommended by PT    Recommendations for Other Services       Precautions / Restrictions Precautions Precautions: Fall Precaution Comments: pt reports h/o 1 fall in past 1 year Restrictions Weight Bearing Restrictions: No      Mobility  Bed Mobility Overal bed mobility: Needs Assistance Bed Mobility: Supine to Sit;Sit to Supine     Supine to sit: Mod assist     General bed mobility comments: mod assist to raise trunk; mod A for LEs into bed; pt reported dizziness in sitting (orthostatics negative), then had N&V, RN notified, assisted pt back to supine  Transfers                 General transfer comment: NT- pt dizzy (not orthostatic) and having N&V while sitting on edge of bed  Ambulation/Gait                Stairs             Wheelchair Mobility    Modified Rankin (Stroke Patients Only)       Balance Overall balance assessment: Needs assistance Sitting-balance support: Feet unsupported;Single extremity supported Sitting balance-Leahy Scale: Fair                                       Pertinent Vitals/Pain Pain Assessment: 0-10 Pain Score: 8  Pain Location: R low back/flank Pain Descriptors / Indicators: Sharp Pain Intervention(s): Limited activity within patient's tolerance;Monitored during session;Premedicated before session    Home Living Family/patient expects to be discharged to:: Private residence Living Arrangements: Alone Available Help at Discharge: Home health Type of Home: House Home Access: Level entry     Home Layout: One level Home Equipment: Cane - single point;Wheelchair - [employer] - 4 wheels      Prior Function Level of Independence: Needs assistance   Gait / Transfers Assistance Needed: walks with rollator  ADL's / Homemaking Assistance Needed: pt reports being independent in cooking and other ADLs, but has home health aide to assist from 8-2  Comments: pt is supposed to have and aide 7 days/week, however pt reports they don't always come     Hand Dominance        Extremity/Trunk Assessment   Upper Extremity Assessment Upper Extremity Assessment: Overall WFL for tasks assessed  Lower Extremity Assessment Lower Extremity Assessment: Overall WFL for tasks assessed(B knee ext +4/5; sensation intact to light touch B feet)    Cervical / Trunk Assessment Cervical / Trunk Assessment: Normal  Communication   Communication: Other (comment)(blind)  Cognition Arousal/Alertness: Lethargic Behavior During Therapy: WFL for tasks assessed/performed Overall Cognitive Status: Within Functional Limits for tasks assessed                                 General Comments: per RN, pt is groggy 2* having morphine and ativan this morning, pt  arousable but falls back asleep quickly      General Comments      Exercises     Assessment/Plan    PT Assessment Patient needs continued PT services  PT Problem List Decreased activity tolerance;Pain;Decreased mobility       PT Treatment Interventions Gait training;Functional mobility training;Therapeutic exercise;Therapeutic activities;Patient/family education    PT Goals (Current goals can be found in the Care Plan section)  Acute Rehab PT Goals Patient Stated Goal: pt hopeful to DC home PT Goal Formulation: With patient/family Time For Goal Achievement: 03/20/18 Potential to Achieve Goals: Good    Frequency Min 2X/week   Barriers to discharge Decreased caregiver support pt lives alone, is blind, reports her daily aides don't always come    Co-evaluation               AM-PAC PT "6 Clicks" Daily Activity  Outcome Measure Difficulty turning over in bed (including adjusting bedclothes, sheets and blankets)?: Unable Difficulty moving from lying on back to sitting on the side of the bed? : Unable Difficulty sitting down on and standing up from a chair with arms (e.g., wheelchair, bedside commode, etc,.)?: Unable Help needed moving to and from a bed to chair (including a wheelchair)?: A Lot Help needed walking in hospital room?: A Lot Help needed climbing 3-5 steps with a railing? : A Lot 6 Click Score: 9    End of Session   Activity Tolerance: Treatment limited secondary to medical complications (Comment)(N&V) Patient left: in bed;with call bell/phone within reach;with family/visitor present Nurse Communication: Mobility status;Patient requests pain meds PT Visit Diagnosis: Difficulty in walking, not elsewhere classified (R26.2)    Time: 9924-2683 PT Time Calculation (min) (ACUTE ONLY): 23 min   Charges:   PT Evaluation $PT Eval Low Complexity: 1 Low PT Treatments $Therapeutic Activity: 8-22 mins        Blondell Reveal Kistler PT 03/06/2018   Acute Rehabilitation Services Pager [phone] Office [phone]

## 2018-03-06 NOTE — Progress Notes (Signed)
CSW left voicemail for RNCM at this time regarding pt.    Virgie Dad Tani Virgo, MSW, [facility] Clinical Social Worker [phone]

## 2018-03-06 NOTE — ED Notes (Signed)
Pt able to ambulate with assistance of this RN and walker appx 20 feet.

## 2018-03-06 NOTE — ED Notes (Signed)
Spoke with pt in regards to SW question pertaining to whether pt had preference of being placed at a facility vs going home and getting home health and PT.  PT stated she would rather go home.  Pts sister also mentioned that pt had recently been discharged from a ALF where she had received PT for her inability to walk. Pts sister stated that pt has PT that comes to her home, with an appt scheduled for later today.

## 2018-03-06 NOTE — Progress Notes (Signed)
NT reports that patient's BP is 87/52 checked twice using the dinamap with both arms,BP  80/50  checked manually by RN . We will notify on call provider.

## 2018-03-06 NOTE — H&P (Addendum)
History and Physical    Amy [name] WNI:[id number] DOB: [birth year] DOA: 03/06/2018  I have briefly reviewed the patient's prior medical records in [place]  PCP: Velna Hatchet, MD  Patient coming from: home  Chief Complaint: Back pain, intractable nausea vomiting  HPI: Amy [name] is a 57 y.o. female with medical history significant of blindness, diabetes mellitus, chronic kidney disease stage III hypertension, hyperlipidemia, hypothyroidism, gastroparesis, hypertension, chronic diastolic CHF who presents to the ED with complaints of right side and right flank pain following a sudden move when getting into a car yesterday.  She has been having pain since.  She was brought to the ED, and despite being here overnight with different pain regimen her pain was not being able to be controlled.  In addition, she has been having significant nausea and vomiting and has been unable to keep anything down.  She reports a history of gastroparesis.  She denies any fever or chills, no chest pain or shortness of breath.  ED Course: In the ED her work-up is rather unremarkable, she underwent a CT scan of the abdomen and pelvis which did not show acute findings, vitals are stable and blood work is relatively at baseline.  Due to uncontrolled symptoms with persistent intractable nausea and vomiting, inability to keep anything down, we are asked to admit.  Review of Systems: As per HPI otherwise 10 point review of systems negative.   Past Medical History:  Diagnosis Date  . Arthritis   . Asthma   . Blind   . Blindness and low vision    left eye glass eye,  legally blind in right eye  . Diabetes mellitus   . Diabetic neuropathy ([place])   . Hyperlipidemia   . Hypertension   . Hypothyroidism   . Spinal stenosis     Past Surgical History:  Procedure Laterality Date  . ABDOMINAL HYSTERECTOMY  1990  . [place]   x 2  . CHOLECYSTECTOMY  2008  . ENUCLEATION Bilateral  09/15/1998  . EYE SURGERY  2016   fitting artificial eye     reports that she quit smoking about 25 years ago. Her smoking use included cigarettes. She has never used smokeless tobacco. She reports that she does not drink alcohol or use drugs.  Allergies  Allergen Reactions  . Penicillins Hives and Swelling    Has patient had a PCN reaction causing immediate rash, facial/tongue/throat swelling, SOB or lightheadedness with hypotension: yes- face swelling Has patient had a PCN reaction causing severe rash involving mucus membranes or skin necrosis: no Has patient had a PCN reaction that required hospitalization unknown (childhood allergy) Has patient had a PCN reaction occurring within the last 10 years: no If all of the above answers are "NO", then may proceed with Cephalosporin use.   . Codeine Nausea Only  . Iodine-131 Hives    hives  . Iohexol Hives and Rash    pt developed itching and hives along with nasal congestion; needs 13 hour premeds for future studies, Onset Date: [id number]   Code: HIVES, Desc: pt developed itching and hives along with nasal congestion; needs 13 hour premeds for future studies, Onset Date: [id number]  Code: HIVES, Desc: pt developed itching and hives along with nasal congestion; needs 13 hour premeds for future studies, Onset Date: [id number]   . Lisinopril Cough  . Sulfa Antibiotics Nausea And Vomiting    Family History  Problem Relation Age of Onset  . Diabetes Sister   .  Glaucoma Sister   . Hypertension Sister   . Stroke Brother   . Heart attack Paternal Aunt   . Breast cancer Neg Hx     Prior to Admission medications   Medication Sig Start Date End Date Taking? Authorizing Provider  albuterol (PROVENTIL HFA;VENTOLIN HFA) 108 (90 BASE) MCG/ACT inhaler Inhale 2 puffs into the lungs every 4 (four) hours as needed for wheezing or shortness of breath. For short   Yes [provider]  aspirin EC 81 MG tablet Take 81 mg by mouth daily.   Yes  [provider]  blood glucose meter kit and supplies KIT [employer] Autocode glucose meter. Dispense based on patient and insurance preference. Use up to four times daily as directed. (FOR ICD-9 250.00, 250.01). 12/13/17  Yes Dessa Phi, DO  esomeprazole (NEXIUM) 40 MG capsule Take 40 mg by mouth daily at 12 noon.    Yes [provider]  fluticasone (FLONASE) 50 MCG/ACT nasal spray Place 1 spray into both nostrils daily.    Yes [provider]  HUMALOG KWIKPEN 100 UNIT/ML KiwkPen Inject into the skin 3 (three) times daily. 12-12-17 take 8 units in morning, 10 units at lunch, 14 units at dinner 01/24/17  Yes [provider]  Insulin Degludec (TRESIBA FLEXTOUCH) 200 UNIT/ML SOPN Inject 60 Units into the skin at bedtime.    Yes [provider]  levothyroxine (SYNTHROID, LEVOTHROID) 150 MCG tablet Take 150 mcg by mouth daily before breakfast.   Yes [provider]  Linaclotide (LINZESS) 145 MCG CAPS capsule Take 145 mcg by mouth daily as needed (constipation).    Yes [provider]  metolazone (ZAROXOLYN) 5 MG tablet TAKE 1 TABLET BY MOUTH ONCE DAILY AS NEEDED. TAKE FOR WEIGHT GAIN OF 3 LBS OVERNIGHT OR 5LBS IN A WEEK Patient taking differently: Take 5 mg by mouth daily as needed (weight gain of 3lbs overnight or 5 lbs in a week).  11/15/17  Yes Bensimhon, Shaune Pascal, MD  potassium chloride SA (K-DUR,KLOR-CON) 20 MEQ tablet Take 2 tablets (40 mEq total) by mouth daily. 11/06/17  Yes Georgiana Shore, NP  pregabalin (LYRICA) 150 MG capsule Take 150 mg by mouth 3 (three) times daily.  01/01/18  Yes [provider]  simvastatin (ZOCOR) 20 MG tablet Take 20 mg by mouth daily. 11/24/17  Yes [provider]  spironolactone (ALDACTONE) 25 MG tablet Take 0.5 tablets (12.5 mg total) by mouth daily. 10/30/17  Yes Shirley Friar, PA-C  torsemide (DEMADEX) 20 MG tablet Take 2 tablets (40 mg total) by mouth daily. Patient taking  differently: Take 40 mg by mouth 2 (two) times daily.  10/24/17  Yes Georgiana Shore, NP  triamcinolone cream (KENALOG) 0.1 % Apply 1 application topically 2 (two) times daily.   Yes [provider]  pregabalin (LYRICA) 75 MG capsule Take 1 capsule (75 mg total) by mouth 3 (three) times daily. Patient not taking: Reported on 03/06/2018 12/13/17 03/06/18  Dessa Phi, DO  insulin glargine (LANTUS) 100 UNIT/ML injection Inject 0.4 mLs (40 Units total) into the skin at bedtime. 12/18/12 01/24/18  Reyne Dumas, MD    Physical Exam: Vitals:   03/06/18 1115 03/06/18 1130 03/06/18 1145 03/06/18 1200  BP:  140/85  (!) 152/94  Pulse: 61 64 72 69  Resp:    14  Temp:      TempSrc:      SpO2: 95% 94% 95% (!) 89%  Weight:      Height:  Constitutional: NAD Eyes:  lids and conjunctivae normal ENMT: Mucous membranes are moist. Respiratory: clear to auscultation bilaterally, no wheezing, no crackles. Normal respiratory effort. No accessory muscle use.  Cardiovascular: Regular rate and rhythm, no murmurs / rubs / gallops.  1+ pitting lower extremity edema. 2+ pedal pulses.  Abdomen: no tenderness, no masses palpated. Bowel sounds positive.  Musculoskeletal: no clubbing / cyanosis Skin: no rashes Neurologic: Grossly nonfocal, spontaneously moves all 4 Psychiatric: Normal judgment and insight. Alert and oriented x 3. Normal mood.   Labs on Admission: I have personally reviewed following labs and imaging studies  CBC: Recent Labs  Lab 03/06/18 0336  WBC 8.2  NEUTROABS 4.7  HGB 12.6  HCT 40.6  MCV 82.0  PLT 001   Basic Metabolic Panel: Recent Labs  Lab 03/06/18 0336  NA 147*  K 3.6  CL 108  CO2 30  GLUCOSE 100*  BUN 42*  CREATININE 1.54*  CALCIUM 9.0   GFR: Estimated Creatinine Clearance: 40 mL/min (A) (by C-G formula based on SCr of 1.54 mg/dL (H)). Liver Function Tests: Recent Labs  Lab 03/06/18 0336  AST 23  ALT 23  ALKPHOS 98  BILITOT 0.4  PROT 7.9    ALBUMIN 3.5   No results for input(s): LIPASE, AMYLASE in the last 168 hours. No results for input(s): AMMONIA in the last 168 hours. Coagulation Profile: No results for input(s): INR, PROTIME in the last 168 hours. Cardiac Enzymes: No results for input(s): CKTOTAL, CKMB, CKMBINDEX, TROPONINI in the last 168 hours. BNP (last 3 results) No results for input(s): PROBNP in the last 8760 hours. HbA1C: No results for input(s): HGBA1C in the last 72 hours. CBG: No results for input(s): GLUCAP in the last 168 hours. Lipid Profile: No results for input(s): CHOL, HDL, LDLCALC, TRIG, CHOLHDL, LDLDIRECT in the last 72 hours. Thyroid Function Tests: No results for input(s): TSH, T4TOTAL, FREET4, T3FREE, THYROIDAB in the last 72 hours. Anemia Panel: No results for input(s): VITAMINB12, FOLATE, FERRITIN, TIBC, IRON, RETICCTPCT in the last 72 hours. Urine analysis:    Component Value Date/Time   COLORURINE YELLOW 03/06/2018 [place] 03/06/2018 0344   LABSPEC 1.010 03/06/2018 0344   PHURINE 5.0 03/06/2018 0344   GLUCOSEU NEGATIVE 03/06/2018 0344   HGBUR NEGATIVE 03/06/2018 0344   BILIRUBINUR NEGATIVE 03/06/2018 0344   KETONESUR NEGATIVE 03/06/2018 0344   PROTEINUR NEGATIVE 03/06/2018 0344   UROBILINOGEN 0.2 01/16/2015 1121   NITRITE NEGATIVE 03/06/2018 0344   LEUKOCYTESUR NEGATIVE 03/06/2018 0344     Radiological Exams on Admission: Ct Abdomen Pelvis Wo Contrast  Result Date: 03/06/2018 CLINICAL DATA:  Low back/abdominal and right flank pain. EXAM: CT ABDOMEN AND PELVIS WITHOUT CONTRAST TECHNIQUE: Multidetector CT imaging of the abdomen and pelvis was performed following the standard protocol without IV contrast. COMPARISON:  CT 09/11/2015 FINDINGS: Lower chest: Scattered atelectasis. No pleural fluid or confluent airspace disease. Hepatobiliary: Progressive steatosis from prior exam, advanced. The liver is prominent size. The previous vague low-density in the right  hepatic lobe is not as well seen on the current exam. Clips in the gallbladder fossa postcholecystectomy. No biliary dilatation. Pancreas: No ductal dilatation or inflammation. Spleen: Normal in size without focal abnormality. Adrenals/Urinary Tract: Normal adrenal glands. No hydronephrosis. No renal stones. Both ureters are decompressed without stones along the course. Urinary bladder is physiologically distended without stone or wall thickening. Stomach/Bowel: Small hiatal hernia. Stomach distended with ingested contrast. No bowel wall thickening, inflammatory change or obstruction. Normal appendix. Moderate colonic stool  burden. Vascular/Lymphatic: Aortic atherosclerosis without aneurysm. No abdominopelvic adenopathy. Reproductive: Status post hysterectomy. No adnexal masses. Other: No free air or free fluid.  No intra-abdominal abscess. Musculoskeletal: There are no acute or suspicious osseous abnormalities. Facet arthropathy in the lower lumbar spine. Scattered bone islands in the pelvis, unchanged. IMPRESSION: 1. No acute findings.  No renal stone or obstructive uropathy. 2. Progressive hepatic steatosis from prior exam. 3.  Aortic Atherosclerosis (ICD10-I70.0). Electronically Signed   By: Keith Rake M.D.   On: 03/06/2018 06:51   Dg Chest 2 View  Result Date: 03/06/2018 CLINICAL DATA:  Right flank pain.  Nausea vomiting.  Constipation. EXAM: CHEST - 2 VIEW COMPARISON:  CT 02/13/2017.  Chest x-ray 02/04/2017. FINDINGS: Mediastinum and hilar structures are normal. Borderline cardiomegaly with mild pulmonary vascular prominence. No focal infiltrate. No pleural effusion or pneumothorax. No acute bony abnormality. IMPRESSION: Borderline cardiomegaly with mild pulmonary vascular prominence. No acute pulmonary disease. 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  Supportive treatment with pain control, muscle relaxers, avoid narcotics as she is quite sleepy on my interview, attempt Toradol  Intractable nausea vomiting -Possible GI bug versus gastroparesis, placed her on scheduled Reglan -Advance diet as tolerated, keep n.p.o. for now, and try clears later on  Chronic diastolic CHF -Does have 1+ lower extremity edema, she is on room air, continue home diuretics hold further IV fluids  Diabetes mellitus -Continue long-acting insulin at a lower dose given n.p.o. status, add sliding scale  Hypothyroidism -Continue Synthroid  Chronic kidney disease stage III -Creatinine appears at baseline  Hyperlipidemia -Continue statin   DVT prophylaxis: Lovenox  Code Status: Full code   Family Communication: sister bedside Disposition Plan: home when ready Consults called: none     Admission status: obs   At the point of initial evaluation, it is my clinical opinion that admission for OBSERVATION is reasonable and necessary because the patient's presenting complaints in the context of their chronic conditions represent sufficient risk of deterioration or significant morbidity to constitute reasonable grounds for close observation in the hospital setting, but that the patient may be medically stable for discharge from the hospital within 24 to 48 hours.     Marzetta Board, MD [facility] Pager [phone]  If 7PM-7AM, please contact night-coverage [email or web address] Password [email or web address]  03/06/2018, 1:42 PM

## 2018-03-06 NOTE — ED Notes (Signed)
Pt easily awakened, returned to room air.  Will continue to monitor. Oxygen sats 96% on RA.

## 2018-03-06 NOTE — ED Notes (Signed)
ED TO INPATIENT HANDOFF REPORT  Name/Age/Gender Amy [name] 57 y.o. female  Code Status Code Status History    Date Active Date Inactive Code Status Order ID Comments User Context   12/12/2017 0206 12/14/2017 2007 Full Code [id number]  Tomma Rakers, MD ED   01/30/2016 1531 02/01/2016 1707 Full Code [id number]  Ivor Costa, MD ED   09/12/2015 0345 09/21/2015 1842 Full Code [id number]  Norval Morton, MD ED   07/05/2015 1438 07/08/2015 1656 Full Code [id number]  Reubin Milan, MD Inpatient   11/01/2014 1535 11/03/2014 1834 Full Code [id number]  Domenic Polite, MD Inpatient   08/08/2014 1545 08/12/2014 2021 Full Code [id number]  Modena Jansky, MD Inpatient   08/06/2012 1623 08/09/2012 1717 Full Code [id number]  Geradine Girt, DO Inpatient   08/13/2011 2345 08/16/2011 1958 Full Code [id number]  Guinevere Ferrari, RN Inpatient   05/24/2011 0038 05/27/2011 2106 Full Code [id number]  Mayer Camel, RN Inpatient      Home/SNF/Other Home  Chief Complaint Back Pain  Level of Care/Admitting Diagnosis ED Disposition    ED Disposition Condition [place] [facility]: [facility] [[id number]]  Level of Care: Med-Surg [16]  Diagnosis: Intractable nausea and vomiting [315945]  Admitting Physician: Caren Griffins [[id number]]  Attending Physician: Caren Griffins [[id number]]  PT Class (Do Not Modify): Observation [104]  PT Acc Code (Do Not Modify): Observation [10022]       Medical History Past Medical History:  Diagnosis Date  . Arthritis   . Asthma   . Blind   . Blindness and low vision    left eye glass eye,  legally blind in right eye  . Diabetes mellitus   . Diabetic neuropathy ([place])   . Hyperlipidemia   . Hypertension   . Hypothyroidism   . Spinal stenosis     Allergies Allergies  Allergen Reactions  . Penicillins Hives and Swelling    Has patient had a PCN reaction causing immediate rash, facial/tongue/throat swelling, SOB or lightheadedness with  hypotension: yes- face swelling Has patient had a PCN reaction causing severe rash involving mucus membranes or skin necrosis: no Has patient had a PCN reaction that required hospitalization unknown (childhood allergy) Has patient had a PCN reaction occurring within the last 10 years: no If all of the above answers are "NO", then may proceed with Cephalosporin use.   . Codeine Nausea Only  . Iodine-131 Hives    hives  . Iohexol Hives and Rash    pt developed itching and hives along with nasal congestion; needs 13 hour premeds for future studies, Onset Date: [id number]   Code: HIVES, Desc: pt developed itching and hives along with nasal congestion; needs 13 hour premeds for future studies, Onset Date: [id number]  Code: HIVES, Desc: pt developed itching and hives along with nasal congestion; needs 13 hour premeds for future studies, Onset Date: [id number]   . Lisinopril Cough  . Sulfa Antibiotics Nausea And Vomiting    IV Location/Drains/Wounds Patient Lines/Drains/Airways Status   Active Line/Drains/Airways    Name:   Placement date:   Placement time:   Site:   Days:   Peripheral IV 03/06/18 Right Antecubital   03/06/18    0356    Antecubital   less than 1   External Urinary Catheter   12/12/17    0325    -   84          Labs/Imaging Results for orders placed or  performed during the hospital encounter of 03/06/18 (from the past 48 hour(s))  Comprehensive metabolic panel     Status: Abnormal   Collection Time: 03/06/18  3:36 AM  Result Value Ref Range   Sodium 147 (H) 135 - 145 mmol/L   Potassium 3.6 3.5 - 5.1 mmol/L   Chloride 108 98 - 111 mmol/L   CO2 30 22 - 32 mmol/L   Glucose, Bld 100 (H) 70 - 99 mg/dL   BUN 42 (H) 6 - 20 mg/dL   Creatinine, Ser 1.54 (H) 0.44 - 1.00 mg/dL   Calcium 9.0 8.9 - 10.3 mg/dL   Total Protein 7.9 6.5 - 8.1 g/dL   Albumin 3.5 3.5 - 5.0 g/dL   AST 23 15 - 41 U/L   ALT 23 0 - 44 U/L   Alkaline Phosphatase 98 38 - 126 U/L   Total Bilirubin 0.4 0.3 -  1.2 mg/dL   GFR calc non Af Amer 36 (L) >60 mL/min   GFR calc Af Amer 42 (L) >60 mL/min    Comment: (NOTE) The eGFR has been calculated using the CKD EPI equation. This calculation has not been validated in all clinical situations. eGFR's persistently <60 mL/min signify possible Chronic Kidney Disease.    Anion gap 9 5 - 15    Comment: Performed at [facility], [place] [address] [place], [place] [address]  CBC with Differential     Status: Abnormal   Collection Time: 03/06/18  3:36 AM  Result Value Ref Range   WBC 8.2 4.0 - 10.5 K/uL   RBC 4.95 3.87 - 5.11 MIL/uL   Hemoglobin 12.6 12.0 - 15.0 g/dL   HCT 40.6 36.0 - 46.0 %   MCV 82.0 80.0 - 100.0 fL   MCH 25.5 (L) 26.0 - 34.0 pg   MCHC 31.0 30.0 - 36.0 g/dL   RDW 15.5 11.5 - 15.5 %   Platelets 166 150 - 400 K/uL   nRBC 0.0 0.0 - 0.2 %   Neutrophils Relative % 57 %   Neutro Abs 4.7 1.7 - 7.7 K/uL   Lymphocytes Relative 29 %   Lymphs Abs 2.4 0.7 - 4.0 K/uL   Monocytes Relative 12 %   Monocytes Absolute 1.0 0.1 - 1.0 K/uL   Eosinophils Relative 2 %   Eosinophils Absolute 0.2 0.0 - 0.5 K/uL   Basophils Relative 0 %   Basophils Absolute 0.0 0.0 - 0.1 K/uL   Immature Granulocytes 0 %   Abs Immature Granulocytes 0.03 0.00 - 0.07 K/uL    Comment: Performed at [facility], [place] [address], [place], [place] [address]  Urinalysis, Routine w reflex microscopic     Status: None   Collection Time: 03/06/18  3:44 AM  Result Value Ref Range   Color, Urine YELLOW YELLOW   APPearance CLEAR CLEAR   Specific Gravity, Urine 1.010 1.005 - 1.030   pH 5.0 5.0 - 8.0   Glucose, UA NEGATIVE NEGATIVE mg/dL   Hgb urine dipstick NEGATIVE NEGATIVE   Bilirubin Urine NEGATIVE NEGATIVE   Ketones, ur NEGATIVE NEGATIVE mg/dL   Protein, ur NEGATIVE NEGATIVE mg/dL   Nitrite NEGATIVE NEGATIVE   Leukocytes, UA NEGATIVE NEGATIVE    Comment: Performed at [facility], [place] [address] [place],   [address]   Ct Abdomen Pelvis Wo Contrast  Result Date: 03/06/2018 CLINICAL DATA:  Low back/abdominal and right flank pain. EXAM: CT ABDOMEN AND PELVIS WITHOUT CONTRAST TECHNIQUE: Multidetector CT imaging of the abdomen  and pelvis was performed following the standard protocol without IV contrast. COMPARISON:  CT 09/11/2015 FINDINGS: Lower chest: Scattered atelectasis. No pleural fluid or confluent airspace disease. Hepatobiliary: Progressive steatosis from prior exam, advanced. The liver is prominent size. The previous vague low-density in the right hepatic lobe is not as well seen on the current exam. Clips in the gallbladder fossa postcholecystectomy. No biliary dilatation. Pancreas: No ductal dilatation or inflammation. Spleen: Normal in size without focal abnormality. Adrenals/Urinary Tract: Normal adrenal glands. No hydronephrosis. No renal stones. Both ureters are decompressed without stones along the course. Urinary bladder is physiologically distended without stone or wall thickening. Stomach/Bowel: Small hiatal hernia. Stomach distended with ingested contrast. No bowel wall thickening, inflammatory change or obstruction. Normal appendix. Moderate colonic stool burden. Vascular/Lymphatic: Aortic atherosclerosis without aneurysm. No abdominopelvic adenopathy. Reproductive: Status post hysterectomy. No adnexal masses. Other: No free air or free fluid.  No intra-abdominal abscess. Musculoskeletal: There are no acute or suspicious osseous abnormalities. Facet arthropathy in the lower lumbar spine. Scattered bone islands in the pelvis, unchanged. IMPRESSION: 1. No acute findings.  No renal stone or obstructive uropathy. 2. Progressive hepatic steatosis from prior exam. 3.  Aortic Atherosclerosis (ICD10-I70.0). Electronically Signed   By: Keith Rake M.D.   On: 03/06/2018 06:51   Dg Chest 2 View  Result Date: 03/06/2018 CLINICAL DATA:  Right flank pain.  Nausea vomiting.  Constipation. EXAM: CHEST -  2 VIEW COMPARISON:  CT 02/13/2017.  Chest x-ray 02/04/2017. FINDINGS: Mediastinum and hilar structures are normal. Borderline cardiomegaly with mild pulmonary vascular prominence. No focal infiltrate. No pleural effusion or pneumothorax. No acute bony abnormality. IMPRESSION: Borderline cardiomegaly with mild pulmonary vascular prominence. No acute pulmonary disease. Electronically Signed   By: Marcello Moores  Register   On: 03/06/2018 08:00    Pending Labs [employer] (From admission, onward)    Start     Ordered   Signed and Held  Comprehensive metabolic panel  Tomorrow morning,   R     Signed and Held   Signed and Held  CBC  Tomorrow morning,   R     Signed and Held          Vitals/Pain Today's Vitals   03/06/18 1115 03/06/18 1130 03/06/18 1145 03/06/18 1200  BP:  140/85  (!) 152/94  Pulse: 61 64 72 69  Resp:    14  Temp:      TempSrc:      SpO2: 95% 94% 95% (!) 89%  Weight:      Height:      PainSc:        Isolation Precautions No active isolations  Medications Medications  lidocaine (LIDODERM) 5 % 1 patch (1 patch Transdermal Patch Applied 03/06/18 0356)  aspirin EC tablet 81 mg (has no administration in time range)  simvastatin (ZOCOR) tablet 20 mg (has no administration in time range)  spironolactone (ALDACTONE) tablet 12.5 mg (has no administration in time range)  torsemide (DEMADEX) tablet 40 mg (has no administration in time range)  Insulin Degludec SOPN 30 Units (has no administration in time range)  levothyroxine (SYNTHROID, LEVOTHROID) tablet 150 mcg (has no administration in time range)  pantoprazole (PROTONIX) EC tablet 40 mg (has no administration in time range)  linaclotide (LINZESS) capsule 145 mcg (has no administration in time range)  pregabalin (LYRICA) capsule 150 mg (has no administration in time range)  albuterol (PROVENTIL HFA;VENTOLIN HFA) 108 (90 Base) MCG/ACT inhaler 2 puff (has no administration in time range)  fluticasone (FLONASE) 50  MCG/ACT  nasal spray 1 spray (has no administration in time range)  methocarbamol (ROBAXIN) 500 mg in dextrose 5 % 50 mL IVPB (has no administration in time range)  ketorolac (TORADOL) 15 MG/ML injection 15 mg (has no administration in time range)  morphine 4 MG/ML injection 6 mg (6 mg Intravenous Given 03/06/18 0355)  methocarbamol (ROBAXIN) tablet 1,000 mg (1,000 mg Oral Given 03/06/18 0355)  acetaminophen (TYLENOL) tablet 650 mg (650 mg Oral Given 03/06/18 0718)  ondansetron (ZOFRAN) injection 4 mg (4 mg Intravenous Given 03/06/18 0716)  sodium chloride 0.9 % bolus 500 mL (0 mLs Intravenous Stopped 03/06/18 0900)  LORazepam (ATIVAN) injection 1 mg (1 mg Intravenous Given 03/06/18 0803)  morphine 4 MG/ML injection 4 mg (4 mg Intravenous Given 03/06/18 0803)  ondansetron (ZOFRAN) injection 4 mg (4 mg Intravenous Given 03/06/18 1017)  sodium chloride 0.9 % bolus 1,000 mL (0 mLs Intravenous Stopped 03/06/18 1359)  LORazepam (ATIVAN) injection 0.5 mg (0.5 mg Intravenous Given 03/06/18 1219)  metoCLOPramide (REGLAN) injection 5 mg (5 mg Intravenous Given 03/06/18 1219)    Mobility walks with device

## 2018-03-06 NOTE — ED Provider Notes (Signed)
Patient sent to me by Dr. Kathrynn Humble pending social work consult.  Patient's abdominal CT was negative for acute process.  Urinalysis negative.  Continue to complain of severe pain and was medicated repeatedly.  Patient had emesis from her discomfort.  Patient also seen by PT as well as case management and had vomiting in front of them.  Due to inability to control patient's current symptoms will be admitted to the hospital   Lacretia Leigh, MD 03/06/18 1334

## 2018-03-06 NOTE — ED Notes (Signed)
Patient transported to X-ray 

## 2018-03-06 NOTE — Progress Notes (Signed)
CSW received call from [facility] and was informed that she working with pt at this time.    There are no further CSW needs at the moment will sign off as RNCM working with pt for further needed resources.    Virgie Dad Deserie Dirks, MSW, [place] [facility] Clinical Social Worker [phone]

## 2018-03-07 DIAGNOSIS — I5032 Chronic diastolic (congestive) heart failure: Secondary | ICD-10-CM | POA: Diagnosis not present

## 2018-03-07 DIAGNOSIS — M549 Dorsalgia, unspecified: Secondary | ICD-10-CM | POA: Diagnosis not present

## 2018-03-07 LAB — CBC
HCT: 37.2 % (ref 36.0–46.0)
HEMOGLOBIN: 11.3 g/dL — AB (ref 12.0–15.0)
MCH: 25.5 pg — AB (ref 26.0–34.0)
MCHC: 30.4 g/dL (ref 30.0–36.0)
MCV: 83.8 fL (ref 80.0–100.0)
Platelets: 154 10*3/uL (ref 150–400)
RBC: 4.44 MIL/uL (ref 3.87–5.11)
RDW: 15.8 % — ABNORMAL HIGH (ref 11.5–15.5)
WBC: 7.6 10*3/uL (ref 4.0–10.5)
nRBC: 0 % (ref 0.0–0.2)

## 2018-03-07 LAB — COMPREHENSIVE METABOLIC PANEL
ALT: 26 U/L (ref 0–44)
AST: 27 U/L (ref 15–41)
Albumin: 3 g/dL — ABNORMAL LOW (ref 3.5–5.0)
Alkaline Phosphatase: 84 U/L (ref 38–126)
Anion gap: 8 (ref 5–15)
BUN: 40 mg/dL — ABNORMAL HIGH (ref 6–20)
CO2: 28 mmol/L (ref 22–32)
Calcium: 8.5 mg/dL — ABNORMAL LOW (ref 8.9–10.3)
Chloride: 111 mmol/L (ref 98–111)
Creatinine, Ser: 1.88 mg/dL — ABNORMAL HIGH (ref 0.44–1.00)
GFR calc Af Amer: 33 mL/min — ABNORMAL LOW (ref >60)
GFR calc non Af Amer: 28 mL/min — ABNORMAL LOW (ref >60)
Glucose, Bld: 54 mg/dL — ABNORMAL LOW (ref 70–99)
Potassium: 3.5 mmol/L (ref 3.5–5.1)
Sodium: 147 mmol/L — ABNORMAL HIGH (ref 135–145)
Total Bilirubin: 0.7 mg/dL (ref 0.3–1.2)
Total Protein: 6.7 g/dL (ref 6.5–8.1)

## 2018-03-07 LAB — GLUCOSE, CAPILLARY: GLUCOSE-CAPILLARY: 130 mg/dL — AB (ref 70–99)

## 2018-03-07 MED ORDER — SODIUM CHLORIDE 0.9 % IV SOLN
INTRAVENOUS | Status: DC | PRN
  Administered 2018-03-07: 250 mL via INTRAVENOUS

## 2018-03-07 MED ORDER — TORSEMIDE 20 MG PO TABS: 20.0000 mg | ORAL_TABLET | Freq: Every day | ORAL | 11 refills | Status: DC

## 2018-03-07 MED ORDER — INSULIN GLARGINE 100 UNIT/ML ~~LOC~~ SOLN: 30.0000 [IU] | Freq: Every day | SUBCUTANEOUS | 11 refills | Status: DC

## 2018-03-07 NOTE — Discharge Summary (Signed)
Physician Discharge Summary  CHARMON THORSON MVH:[id number] DOB: 05-02-1961 DOA: 03/06/2018  PCP: Velna Hatchet, MD  Admit date: 03/06/2018 Discharge date: 03/07/2018  Time spent: 25 minutes  Recommendations for Outpatient Follow-up:  1. Needs Chem-12 and CBC 1 week 2. Needs outpatient referral to therapy-specifically does need vestibular rehab 3. 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 Chronic diastolic heart failure Admitted from the emergency room 10/22 with right flank pain after sudden movement She had episodic pain in the emergency room and significant nausea vomiting and was brought into the hospital  She was kept overnight and was able to tolerate diet fairly well There were concerns that she was somewhat unsteady on her feet therapy saw her and recommended 24/7 supervision-she does get home health visits which I have increased She is adamant about going home-she does have a sister that lives nearby and checks in on her In addition her aide has been giving her meds appropriately and despite being blind the patient is able to name the doses of her insulin and is aware and coherent enough to understand what dose of diuretics as she is taking  Because she has some mild chronic kidney disease I have cut back her diuretics to torsemide 20 twice daily and in addition because she had a slightly low blood sugar in  the 50s I have cut back her long-acting insulin from 60-30  She is at high risk for decompensation however she is reasonably stabilized at this time-she was discharged home and will need labs soon with her primary  Discharge Exam: Vitals:   03/07/18 1423 03/07/18 1425  BP: (!) 90/47 (!) 112/56  Pulse: 73 73  Resp:    Temp:    SpO2: 97% 97%    General: Alert pleasant oriented no distress Cardiovascular: S1-S2 no murmur Respiratory: Clinically clear no added sound Abdomen soft Feet I on the left side she has poor light reflex and visual reflex on the right  Discharge Instructions   Discharge Instructions    Diet - low sodium heart healthy   Complete by:  As directed    Discharge instructions   Complete by:  As directed    Follow with MD as OP Continue PT Note changes to meds--sepcificlly insulin and Torsemide   Increase activity slowly   Complete by:  As directed      Allergies as of 03/07/2018      Reactions   Penicillins Hives, Swelling   Has patient had a PCN reaction causing immediate rash, facial/tongue/throat swelling, SOB or lightheadedness with hypotension: yes- face swelling Has patient had a PCN reaction causing severe rash involving mucus membranes or skin necrosis: no Has patient had a PCN reaction that required hospitalization unknown (childhood allergy) Has patient had a PCN reaction occurring within the last 10 years: no If all of the above answers are "NO", then may proceed with Cephalosporin use.   Codeine Nausea Only   Iodine-131 Hives  hives   Iohexol Hives, Rash   pt developed itching and hives along with nasal congestion; needs 13 hour premeds for future studies, Onset Date: [id number]  Code: HIVES, Desc: pt developed itching and hives along with nasal congestion; needs 13 hour premeds for future studies, Onset Date: [id number]  Code: HIVES, Desc: pt developed itching and hives along with nasal congestion; needs 13 hour premeds for future studies,  Onset Date: [id number]   Lisinopril Cough   Sulfa Antibiotics Nausea And Vomiting      Medication List    STOP taking these medications   blood glucose meter kit and supplies Kit   metolazone 5 MG tablet Commonly known as:  ZAROXOLYN   simvastatin 20 MG tablet Commonly known as:  ZOCOR   TRESIBA FLEXTOUCH 200 UNIT/ML Sopn Generic drug:  Insulin Degludec Replaced by:  insulin glargine 100 UNIT/ML injection   triamcinolone cream 0.1 % Commonly known as:  KENALOG     TAKE these medications   albuterol 108 (90 Base) MCG/ACT inhaler Commonly known as:  PROVENTIL HFA;VENTOLIN HFA Inhale 2 puffs into the lungs every 4 (four) hours as needed for wheezing or shortness of breath. For short   aspirin EC 81 MG tablet Take 81 mg by mouth daily.   esomeprazole 40 MG capsule Commonly known as:  NEXIUM Take 40 mg by mouth daily at 12 noon.   fluticasone 50 MCG/ACT nasal spray Commonly known as:  FLONASE Place 1 spray into both nostrils daily.   HUMALOG KWIKPEN 100 UNIT/ML KiwkPen Generic drug:  insulin lispro Inject into the skin 3 (three) times daily. 12-12-17 take 8 units in morning, 10 units at lunch, 14 units at dinner   insulin glargine 100 UNIT/ML injection Commonly known as:  LANTUS Inject 0.3 mLs (30 Units total) into the skin at bedtime. Replaces:  TRESIBA FLEXTOUCH 200 UNIT/ML Sopn   levothyroxine 150 MCG tablet Commonly known as:  SYNTHROID, LEVOTHROID Take 150 mcg by mouth daily before breakfast.   LINZESS 145 MCG Caps capsule Generic drug:  linaclotide Take 145 mcg by mouth daily as needed (constipation).   potassium chloride SA 20 MEQ tablet Commonly known as:  K-DUR,KLOR-CON Take 2 tablets (40 mEq total) by mouth daily.   pregabalin 150 MG capsule Commonly known as:  LYRICA Take 150 mg by mouth 3 (three) times daily. What changed:  Another medication with the same name was removed. Continue taking this medication, and follow the directions you see here.    spironolactone 25 MG tablet Commonly known as:  ALDACTONE Take 0.5 tablets (12.5 mg total) by mouth daily.   torsemide 20 MG tablet Commonly known as:  DEMADEX Take 1 tablet (20 mg total) by mouth daily. What changed:  how much to take      Allergies  Allergen Reactions  . Penicillins Hives and Swelling    Has patient had a PCN reaction causing immediate rash, facial/tongue/throat swelling, SOB or lightheadedness with hypotension: yes- face swelling Has patient had a PCN reaction causing severe rash involving mucus membranes or skin necrosis: no Has patient had a PCN reaction that required hospitalization unknown (childhood allergy) Has patient had a PCN reaction occurring within the last 10 years: no If all of the above answers are "NO", then may proceed with Cephalosporin use.   . Codeine Nausea Only  . Iodine-131 Hives    hives  . Iohexol Hives and Rash    pt developed itching and hives along with nasal congestion; needs 13 hour premeds for  future studies, Onset Date: [id number]   Code: HIVES, Desc: pt developed itching and hives along with nasal congestion; needs 13 hour premeds for future studies, Onset Date: 12458099  Code: HIVES, Desc: pt developed itching and hives along with nasal congestion; needs 13 hour premeds for future studies, Onset Date: [id number]   . Lisinopril Cough  . Sulfa Antibiotics Nausea And Vomiting   Follow-up Information    Care, [facility] Follow up.   Specialty:  Home Health Services Why:  Personal Care Aid Contact information: [address] [place] [address] [phone]        Care, [facility] Follow up.   Specialty:  [place] Why:  Nursing, physical therapy, occupational therapy Contact information: [address] [place] [address] [phone]            The results of significant diagnostics from this hospitalization (including imaging, microbiology, ancillary and  laboratory) are listed below for reference.    Significant Diagnostic Studies: Ct Abdomen Pelvis Wo Contrast  Result Date: 03/06/2018 CLINICAL DATA:  Low back/abdominal and right flank pain. EXAM: CT ABDOMEN AND PELVIS WITHOUT CONTRAST TECHNIQUE: Multidetector CT imaging of the abdomen and pelvis was performed following the standard protocol without IV contrast. COMPARISON:  CT 09/11/2015 FINDINGS: Lower chest: Scattered atelectasis. No pleural fluid or confluent airspace disease. Hepatobiliary: Progressive steatosis from prior exam, advanced. The liver is prominent size. The previous vague low-density in the right hepatic lobe is not as well seen on the current exam. Clips in the gallbladder fossa postcholecystectomy. No biliary dilatation. Pancreas: No ductal dilatation or inflammation. Spleen: Normal in size without focal abnormality. Adrenals/Urinary Tract: Normal adrenal glands. No hydronephrosis. No renal stones. Both ureters are decompressed without stones along the course. Urinary bladder is physiologically distended without stone or wall thickening. Stomach/Bowel: Small hiatal hernia. Stomach distended with ingested contrast. No bowel wall thickening, inflammatory change or obstruction. Normal appendix. Moderate colonic stool burden. Vascular/Lymphatic: Aortic atherosclerosis without aneurysm. No abdominopelvic adenopathy. Reproductive: Status post hysterectomy. No adnexal masses. Other: No free air or free fluid.  No intra-abdominal abscess. Musculoskeletal: There are no acute or suspicious osseous abnormalities. Facet arthropathy in the lower lumbar spine. Scattered bone islands in the pelvis, unchanged. IMPRESSION: 1. No acute findings.  No renal stone or obstructive uropathy. 2. Progressive hepatic steatosis from prior exam. 3.  Aortic Atherosclerosis (ICD10-I70.0). Electronically Signed   By: Keith Rake M.D.   On: 03/06/2018 06:51   Dg Chest 2 View  Result Date: 03/06/2018 CLINICAL  DATA:  Right flank pain.  Nausea vomiting.  Constipation. EXAM: CHEST - 2 VIEW COMPARISON:  CT 02/13/2017.  Chest x-ray 02/04/2017. FINDINGS: Mediastinum and hilar structures are normal. Borderline cardiomegaly with mild pulmonary vascular prominence. No focal infiltrate. No pleural effusion or pneumothorax. No acute bony abnormality. IMPRESSION: Borderline cardiomegaly with mild pulmonary vascular prominence. No acute pulmonary disease. Electronically Signed   By: Marcello Moores  Register   On: 03/06/2018 08:00   Mm 3d Screen Breast Bilateral  Result Date: 02/13/2018 CLINICAL DATA:  Screening. EXAM: DIGITAL SCREENING BILATERAL MAMMOGRAM WITH TOMO AND CAD COMPARISON:  Previous exam(s). ACR Breast Density Category c: The breast tissue is heterogeneously dense, which may obscure small masses. FINDINGS: There are no findings suspicious for malignancy. Images were processed with CAD. IMPRESSION: No mammographic evidence of malignancy. A result letter of this screening mammogram will be mailed directly to the patient. RECOMMENDATION: Screening mammogram in one year. (Code:[id number]) BI-RADS CATEGORY  1: Negative. Electronically  Signed   By: Claudie Revering M.D.   On: 02/13/2018 12:53    Microbiology: No results found for this or any previous visit (from the past 240 hour(s)).   Labs: Basic Metabolic Panel: Recent Labs  Lab 03/06/18 0336 03/07/18 0441  NA 147* 147*  K 3.6 3.5  CL 108 111  CO2 30 28  GLUCOSE 100* 54*  BUN 42* 40*  CREATININE 1.54* 1.88*  CALCIUM 9.0 8.5*   Liver Function Tests: Recent Labs  Lab 03/06/18 0336 03/07/18 0441  AST 23 27  ALT 23 26  ALKPHOS 98 84  BILITOT 0.4 0.7  PROT 7.9 6.7  ALBUMIN 3.5 3.0*   No results for input(s): LIPASE, AMYLASE in the last 168 hours. No results for input(s): AMMONIA in the last 168 hours. CBC: Recent Labs  Lab 03/06/18 0336 03/07/18 0441  WBC 8.2 7.6  NEUTROABS 4.7  --   HGB 12.6 11.3*  HCT 40.6 37.2  MCV 82.0 83.8  PLT 166 154    Cardiac Enzymes: No results for input(s): CKTOTAL, CKMB, CKMBINDEX, TROPONINI in the last 168 hours. BNP: BNP (last 3 results) Recent Labs    07/13/17 1147  BNP 65.9    ProBNP (last 3 results) No results for input(s): PROBNP in the last 8760 hours.  CBG: Recent Labs  Lab 03/06/18 2102 03/07/18 1202  GLUCAP 126* 130*       Signed:  Nita Sells MD   [facility] 03/07/2018, 3:49 PM

## 2018-03-07 NOTE — Care Management Obs Status (Signed)
[place] NOTIFICATION   Patient Details  Name: Amy [name] MRN: [id number] Date of Birth: 10/28/1960   Medicare Observation Status Notification Given:  Yes    Purcell Mouton, RN 03/07/2018, 4:08 PM

## 2018-03-07 NOTE — Progress Notes (Signed)
Inpatient Diabetes Program Recommendations  AACE/ADA: New Consensus Statement on Inpatient Glycemic Control (2015)  Target Ranges:  Prepandial:   less than 140 mg/dL      Peak postprandial:   less than 180 mg/dL (1-2 hours)      Critically ill patients:  140 - 180 mg/dL   Lab Results  Component Value Date   GLUCAP 126 (H) 03/06/2018   HGBA1C 9.3 (H) 12/11/2017    Review of Glycemic Control  Diabetes history: DM1 Outpatient Diabetes medications: Tresiba 60 units QHS, Humalog 12-23-12 units tidwc Current orders for Inpatient glycemic control: Lantus 30 units QHS  Inpatient Diabetes Program Recommendations:     Decrease Lantus to 20 units QHS Add Novolog 0-9 units tidwc Add Novolog 3 units tidwc if pt eats > 50% meal. Change diet to CHO mod med.  Continue to follow.  Thank you. [name], RD, LDN, CDE Inpatient Diabetes Coordinator [phone]

## 2018-03-09 ENCOUNTER — Encounter

## 2018-03-09 ENCOUNTER — Ambulatory Visit: Payer: Medicare Other | Admitting: Podiatry

## 2018-04-03 ENCOUNTER — Other Ambulatory Visit (HOSPITAL_COMMUNITY): Payer: Self-pay | Admitting: Internal Medicine

## 2018-04-05 ENCOUNTER — Ambulatory Visit (HOSPITAL_BASED_OUTPATIENT_CLINIC_OR_DEPARTMENT_OTHER)
Admission: RE | Admit: 2018-04-05 | Discharge: 2018-04-05 | Disposition: A | Discharge status: Home / Self Care | Payer: Medicare Other | Source: Ambulatory Visit | Attending: Internal Medicine | Admitting: Internal Medicine

## 2018-04-05 ENCOUNTER — Ambulatory Visit (HOSPITAL_COMMUNITY)
Admission: RE | Admit: 2018-04-05 | Discharge: 2018-04-05 | Disposition: A | Discharge status: Home / Self Care | Payer: Medicare Other | Source: Ambulatory Visit | Attending: Internal Medicine | Admitting: Internal Medicine

## 2018-04-05 VITALS — BP 130/70 | HR 70 | Wt 212.0 lb

## 2018-04-05 DIAGNOSIS — I5032 Chronic diastolic (congestive) heart failure: Secondary | ICD-10-CM

## 2018-04-05 DIAGNOSIS — I110 Hypertensive heart disease with heart failure: Secondary | ICD-10-CM | POA: Diagnosis not present

## 2018-04-05 DIAGNOSIS — E119 Type 2 diabetes mellitus without complications: Secondary | ICD-10-CM | POA: Insufficient documentation

## 2018-04-05 DIAGNOSIS — Z9989 Dependence on other enabling machines and devices: Secondary | ICD-10-CM | POA: Diagnosis not present

## 2018-04-05 DIAGNOSIS — I10 Essential (primary) hypertension: Secondary | ICD-10-CM

## 2018-04-05 DIAGNOSIS — E785 Hyperlipidemia, unspecified: Secondary | ICD-10-CM | POA: Insufficient documentation

## 2018-04-05 DIAGNOSIS — G4733 Obstructive sleep apnea (adult) (pediatric): Secondary | ICD-10-CM | POA: Diagnosis not present

## 2018-04-05 LAB — BASIC METABOLIC PANEL
ANION GAP: 12 (ref 5–15)
BUN: 64 mg/dL — ABNORMAL HIGH (ref 6–20)
CALCIUM: 9.2 mg/dL (ref 8.9–10.3)
CO2: 27 mmol/L (ref 22–32)
Chloride: 100 mmol/L (ref 98–111)
Creatinine, Ser: 2.26 mg/dL — ABNORMAL HIGH (ref 0.44–1.00)
GFR, EST AFRICAN AMERICAN: 26 mL/min — AB (ref >60)
GFR, EST NON AFRICAN AMERICAN: 23 mL/min — AB (ref >60)
Glucose, Bld: 184 mg/dL — ABNORMAL HIGH (ref 70–99)
Potassium: 3.4 mmol/L — ABNORMAL LOW (ref 3.5–5.1)
Sodium: 139 mmol/L (ref 135–145)

## 2018-04-05 LAB — BRAIN NATRIURETIC PEPTIDE: B Natriuretic Peptide: 48.9 pg/mL (ref 0.0–100.0)

## 2018-04-05 NOTE — Progress Notes (Signed)
[facility]  Referring Physician: Dr.Berry Primary Cardiologist: Dr. Gwenlyn Found  PCP: Dr. Ardeth Perfect HF MD: Dr Haroldine Laws  HPI: Amy [name] is a 57 y.o. female with obesity, HTN, diabetes with legal blindness, HL, OSA and diastolic HF.  She was referred by Dr. Gwenlyn Found for further evaluation and management of diastolic HF.   She had a negative Myoview 10/10/14 and a 2-D echo performed 07/06/15 that revealed normal LV/RV function and grade 2 diastolic dysfunction.  Today she returns for HF follow up. No longer going to [place] to do exercises. Admitted in 10/19 for flank pain. Creatinine up at that time a little bit so torsemide cut to 20 bid but she continues to take 40 bid. Has nurse from [facility] who helps set up pill box. Denies CP or SOB. No edema, orthopnea or PND. Gets around with walker. Tries to limit fluid. Weight stable.  No dizziness.  Echo today EF 55-60% Grade 1 DD. Personally reviewed    Review of systems complete and found to be negative unless listed in HPI.    Past Medical History:  Diagnosis Date  . Arthritis   . Asthma   . Blind   . Blindness and low vision    left eye glass eye,  legally blind in right eye  . Diabetes mellitus   . Diabetic neuropathy ([place])   . Hyperlipidemia   . Hypertension   . Hypothyroidism   . Spinal stenosis     Current Outpatient Medications  Medication Sig Dispense Refill  . albuterol (PROVENTIL HFA;VENTOLIN HFA) 108 (90 BASE) MCG/ACT inhaler Inhale 2 puffs into the lungs every 4 (four) hours as needed for wheezing or shortness of breath. For short    . esomeprazole (NEXIUM) 40 MG capsule Take 40 mg by mouth daily at 12 noon.     . fluticasone (FLONASE) 50 MCG/ACT nasal spray Place 1 spray into both nostrils daily.     Marland Kitchen HUMALOG KWIKPEN 100 UNIT/ML KiwkPen Inject into the skin 3 (three) times daily. 12-12-17 take 8 units in morning, 10 units at lunch, 14 units at dinner    . insulin glargine (LANTUS) 100 UNIT/ML  injection Inject 0.3 mLs (30 Units total) into the skin at bedtime. 10 mL 11  . levothyroxine (SYNTHROID, LEVOTHROID) 150 MCG tablet Take 150 mcg by mouth daily before breakfast.    . potassium chloride SA (K-DUR,KLOR-CON) 20 MEQ tablet Take 2 tablets (40 mEq total) by mouth daily. 60 tablet 3  . pregabalin (LYRICA) 150 MG capsule Take 150 mg by mouth 3 (three) times daily.     . simvastatin (ZOCOR) 20 MG tablet Take 20 mg by mouth daily.    Marland Kitchen spironolactone (ALDACTONE) 25 MG tablet Take 0.5 tablets (12.5 mg total) by mouth daily. 90 tablet 3  . torsemide (DEMADEX) 20 MG tablet Take 1 tablet (20 mg total) by mouth daily. (Patient taking differently: Take 20 mg by mouth 2 (two) times daily. ) 60 tablet 11  . aspirin EC 81 MG tablet Take 81 mg by mouth daily.    . Linaclotide (LINZESS) 145 MCG CAPS capsule Take 145 mcg by mouth daily as needed (constipation).      No current facility-administered medications for this encounter.     Allergies  Allergen Reactions  . Penicillins Hives and Swelling    Has patient had a PCN reaction causing immediate rash, facial/tongue/throat swelling, SOB or lightheadedness with hypotension: yes- face swelling Has patient had a PCN reaction causing severe  rash involving mucus membranes or skin necrosis: no Has patient had a PCN reaction that required hospitalization unknown (childhood allergy) Has patient had a PCN reaction occurring within the last 10 years: no If all of the above answers are "NO", then may proceed with Cephalosporin use.   . Codeine Nausea Only  . Iodine-131 Hives    hives  . Iohexol Hives and Rash    pt developed itching and hives along with nasal congestion; needs 13 hour premeds for future studies, Onset Date: [id number]   Code: HIVES, Desc: pt developed itching and hives along with nasal congestion; needs 13 hour premeds for future studies, Onset Date: [id number]  Code: HIVES, Desc: pt developed itching and hives along with nasal  congestion; needs 13 hour premeds for future studies, Onset Date: [id number]   . Lisinopril Cough  . Sulfa Antibiotics Nausea And Vomiting      Social History   Socioeconomic History  . Marital status: Single    Spouse name: Not on file  . Number of children: 2  . Years of education: 27  . Highest education level: Not on file  Occupational History  . Occupation: N/A    Comment: disabled  Social Needs  . Financial resource strain: Not on file  . Food insecurity:    Worry: Not on file    Inability: Not on file  . Transportation needs:    Medical: Not on file    Non-medical: Not on file  Tobacco Use  . Smoking status: Former Smoker    Types: Cigarettes    Last attempt to quit: 05/22/1992    Years since quitting: 25.8  . Smokeless tobacco: Never Used  Substance and Sexual Activity  . Alcohol use: No    Comment: quit 20 yrs ago  . Drug use: No  . Sexual activity: Not on file  Lifestyle  . Physical activity:    Days per week: Not on file    Minutes per session: Not on file  . Stress: Not on file  Relationships  . Social connections:    Talks on phone: Not on file    Gets together: Not on file    Attends religious service: Not on file    Active member of club or organization: Not on file    Attends meetings of clubs or organizations: Not on file    Relationship status: Not on file  . Intimate partner violence:    Fear of current or ex partner: Not on file    Emotionally abused: Not on file    Physically abused: Not on file    Forced sexual activity: Not on file  Other Topics Concern  . Not on file  Social History Narrative   Lives alone   caffeine drinks 2 cups of coffee a day, occasional soda    Family History  Problem Relation Age of Onset  . Diabetes Sister   . Glaucoma Sister   . Hypertension Sister   . Stroke Brother   . Heart attack Paternal Aunt   . Breast cancer Neg Hx    Vitals:   04/05/18 0955  BP: 130/70  Pulse: 70  SpO2: 95%  Weight: 96.2 kg  (212 lb)   Wt Readings from Last 3 Encounters:  04/05/18 96.2 kg (212 lb)  03/05/18 94.3 kg (208 lb)  01/24/18 96.3 kg (212 lb 3.2 oz)    PHYSICAL EXAM: General:  Obese woman. No resp difficulty HEENT: normal except blind in left eye Neck: supple. Hard  to see JVP  Carotids 2+ bilat; no bruits. No lymphadenopathy or thryomegaly appreciated. Cor: PMI nondisplaced. Regular rate & rhythm. No rubs, gallops or murmurs. Lungs: clear Abdomen: obese soft, nontender, nondistended. No hepatosplenomegaly. No bruits or masses. Good bowel sounds. Extremities: no cyanosis, clubbing, rash, edema Neuro: alert & orientedx3, cranial nerves grossly intact. moves all 4 extremities w/o difficulty. Affect pleasant   ASSESSMENT & PLAN: 1. Chronic diastolic HF - Echo 4/53 Normal LV/R function Grade II DD.  - Echo today. EF stable 55-60% RV ok  Personally reviewed - Stable NYHA III - Volume status looks good. May be a bit on the dry side. Continue torsemide 80 daily for now. Check labs. Can cut back to 60 daily as needed.  - Continue spiro25 mg daily.  - Reinforced daily weights, low salt food choices, and limiting fluid intake to < 2 liters per day.  - Has poor insight into HF. May need Paramedicine at some point.  2. HTN - Stable Continue current regimen.  3. DM2 - Per PCP.  - With HF would strongly consider Jardiance if renalfunction permits 4. OSA  - Continue CPAP qhs. Wearing every night.  5. CKD III - BMET today    Glori Bickers, MD  10:28 AM

## 2018-04-05 NOTE — Patient Instructions (Signed)
Labs done today.  Follow up with Dr. Haroldine Laws in 6 months.

## 2018-04-05 NOTE — Progress Notes (Signed)
*  PRELIMINARY RESULTS* Echocardiogram 2D Echocardiogram has been performed.  04/05/2018, 9:51 AM

## 2018-04-06 ENCOUNTER — Other Ambulatory Visit (HOSPITAL_COMMUNITY): Payer: Self-pay

## 2018-04-06 DIAGNOSIS — I10 Essential (primary) hypertension: Secondary | ICD-10-CM

## 2018-04-25 ENCOUNTER — Ambulatory Visit (INDEPENDENT_AMBULATORY_CARE_PROVIDER_SITE_OTHER): Payer: Medicare Other | Admitting: Podiatry

## 2018-04-25 ENCOUNTER — Encounter: Payer: Self-pay | Admitting: Podiatry

## 2018-04-25 DIAGNOSIS — Z794 Long term (current) use of insulin: Secondary | ICD-10-CM

## 2018-04-25 DIAGNOSIS — B351 Tinea unguium: Secondary | ICD-10-CM | POA: Diagnosis not present

## 2018-04-25 DIAGNOSIS — M79676 Pain in unspecified toe(s): Secondary | ICD-10-CM

## 2018-04-25 DIAGNOSIS — E1140 Type 2 diabetes mellitus with diabetic neuropathy, unspecified: Secondary | ICD-10-CM

## 2018-04-25 DIAGNOSIS — L608 Other nail disorders: Secondary | ICD-10-CM

## 2018-04-25 DIAGNOSIS — L84 Corns and callosities: Secondary | ICD-10-CM | POA: Diagnosis not present

## 2018-04-25 NOTE — Progress Notes (Signed)
Patient ID: Amy [name], female   DOB: [birth year], 59 y.o.   MRN: [id number] Complaint:  Visit Type: Patient returns to my office for continued preventative foot care services. Complaint: Patient states" my nails have grown long and thick and become painful to walk and wear shoes" Patient has been diagnosed with DM with neuropathy.. The patient presents for preventative foot care services. No changes to ROS.  Patient says she has painful skin lesion on the inside of her right heel. She has severe pain when sleeping.  Podiatric Exam: Vascular: dorsalis pedis and posterior tibial pulses are palpable bilateral. Capillary return is immediate. Temperature gradient is WNL. Skin turgor WNL  Sensorium: Normal Semmes Weinstein monofilament test. Normal tactile sensation bilaterally. Nail Exam: Pt has thick disfigured discolored nails with subungual debris noted bilateral entire nail hallux through fifth toenails Ulcer Exam: There is no evidence of ulcer or pre-ulcerative changes or infection. Orthopedic Exam: Muscle tone and strength are WNL. No limitations in general ROM. No crepitus or effusions noted. Foot type and digits show no abnormalities. Bony prominences are unremarkable. Skin: No Porokeratosis. No infection or ulcers.  Asymptomatic plantar fibroma left arch. Preulcerous callus medial plantar aspect right hee.  No drainage or infection.  Diagnosis:  Onychomycosis, , Pain in right toe, pain in left toes  Treatment & Plan Procedures and Treatment: Consent by patient was obtained for treatment procedures. The patient understood the discussion of treatment and procedures well. All questions were answered thoroughly reviewed. Debridement of mycotic and hypertrophic toenails, 1 through 5 bilateral and clearing of subungual debris. No ulceration, no infection noted. Heel pillow was prescribed to prevent further breakdown of skin lesion. Return Visit-Office Procedure: Patient instructed to return to the  office for a follow up visit 10 weeks  for continued evaluation and treatment.    Gardiner Barefoot DPM

## 2018-05-03 ENCOUNTER — Encounter: Payer: Self-pay | Admitting: Nurse Practitioner

## 2018-05-14 NOTE — Progress Notes (Signed)
Cardiology Office Note   Date:  05/15/2018   ID:  Amy [name], DOB 1960/07/07, MRN [id number]  PCP:  Velna Hatchet, MD  Cardiologist:  [facility]  Chief Complaint  Patient presents with  . Congestive Heart Failure  . Hypertension     History of Present Illness: Amy [name] is a 57 y.o. female who presents for ongoing assessment and management of HTN, HL, Diastolic CHF, OSA,  DM with legal blindness. She had a negative Myoview 10/10/14 and a 2-D echo performed 07/06/15 that revealed normal LV/RV function and grade 2 diastolic dysfunction.  Last seen by cardiology on 04/05/2018, at that time she was stable at NYHA III status. She was continued on torsemide 80 mg daily, spironolactone 25 mg daily.  She comes today with volume overload. She has gained 6-9 lbs over the last week. She is only taking 40 mg of torsemide and  12.5 mg of spironolactone. She is not sure who changed the dose. She admits to eating salty foods over the holiday.  .   Past Medical History:  Diagnosis Date  . Arthritis   . Asthma   . Blind   . Blindness and low vision    left eye glass eye,  legally blind in right eye  . Diabetes mellitus   . Diabetic neuropathy ([place])   . Hyperlipidemia   . Hypertension   . Hypothyroidism   . Spinal stenosis     Past Surgical History:  Procedure Laterality Date  . ABDOMINAL HYSTERECTOMY  1990  . [place]   x 2  . CHOLECYSTECTOMY  2008  . ENUCLEATION Bilateral 09/15/1998  . EYE SURGERY  2016   fitting artificial eye     Current Outpatient Medications  Medication Sig Dispense Refill  . albuterol (PROVENTIL HFA;VENTOLIN HFA) 108 (90 BASE) MCG/ACT inhaler Inhale 2 puffs into the lungs every 4 (four) hours as needed for wheezing or shortness of breath. For short    . aspirin EC 81 MG tablet Take 81 mg by mouth daily.    Marland Kitchen esomeprazole (NEXIUM) 40 MG capsule Take 40 mg by mouth daily at 12 noon.     . fluticasone (FLONASE) 50 MCG/ACT nasal spray  Place 1 spray into both nostrils daily.     Marland Kitchen HUMALOG KWIKPEN 100 UNIT/ML KiwkPen Inject into the skin 3 (three) times daily. 12-12-17 take 8 units in morning, 10 units at lunch, 14 units at dinner    . insulin glargine (LANTUS) 100 UNIT/ML injection Inject 0.3 mLs (30 Units total) into the skin at bedtime. 10 mL 11  . levothyroxine (SYNTHROID, LEVOTHROID) 150 MCG tablet Take 150 mcg by mouth daily before breakfast.    . Linaclotide (LINZESS) 145 MCG CAPS capsule Take 145 mcg by mouth daily as needed (constipation).     . potassium chloride SA (K-DUR,KLOR-CON) 20 MEQ tablet Take 2 tablets (40 mEq total) by mouth daily. 60 tablet 3  . pregabalin (LYRICA) 150 MG capsule Take 150 mg by mouth 3 (three) times daily.     . simvastatin (ZOCOR) 20 MG tablet Take 20 mg by mouth daily.    Marland Kitchen spironolactone (ALDACTONE) 25 MG tablet Take 0.5 tablets (12.5 mg total) by mouth daily. 90 tablet 3  . torsemide (DEMADEX) 20 MG tablet Take 1 tablet (20 mg total) by mouth daily. (Patient taking differently: Take 20 mg by mouth 2 (two) times daily. ) 60 tablet 11   No current facility-administered medications for this visit.  Allergies:   Penicillins; Codeine; Iodine-131; Iohexol; Lisinopril; and Sulfa antibiotics    Social History:  The patient  reports that she quit smoking about 25 years ago. Her smoking use included cigarettes. She has never used smokeless tobacco. She reports that she does not drink alcohol or use drugs.   Family History:  The patient's family history includes Diabetes in her sister; Glaucoma in her sister; Heart attack in her paternal aunt; Hypertension in her sister; Stroke in her brother.    ROS: All other systems are reviewed and negative. Unless otherwise mentioned in H&P    PHYSICAL EXAM: VS:  BP 124/66   Pulse 90   Ht 4\' 11"  (1.499 m)   Wt 219 lb (99.3 kg)   BMI 44.23 kg/m  , BMI Body mass index is 44.23 kg/m. GEN: Well nourished, well developed, in no acute distress HEENT:  normal Neck: no JVD, carotid bruits, or masses Cardiac: RRR; no murmurs, rubs, or gallops, 2+ pretibial edema  Respiratory:  Clear to auscultation bilaterally, normal work of breathing GI: soft, nontender, nondistended, + BS MS: no deformity or atrophy Skin: warm and dry, no rash Neuro:  Strength and sensation are intact Psych: euthymic mood, full affect   EKG:  Not completed this office visit.   Recent Labs: 12/11/2017: TSH 0.279 03/07/2018: ALT 26; Hemoglobin 11.3; Platelets 154 04/20/2018: B Natriuretic Peptide 48.9; BUN 64; Creatinine, Ser 2.26; Potassium 3.4; Sodium 139    Lipid Panel    Component Value Date/Time   CHOL 170 10/22/2012 1351   TRIG 112 10/22/2012 1351   HDL 56 10/22/2012 1351   CHOLHDL 3.0 10/22/2012 1351   VLDL 22 10/22/2012 1351   LDLCALC 92 10/22/2012 1351      Wt Readings from Last 3 Encounters:  05/15/18 219 lb (99.3 kg)  Apr 20, 2018 212 lb (96.2 kg)  03/05/18 208 lb (94.3 kg)      Other studies Reviewed: Echocardiogram 04-20-2018  Left ventricle: The cavity size was normal. Systolic function was   normal. The estimated ejection fraction was in the range of 60%   to 65%. Wall motion was normal; there were no regional wall   motion abnormalities. Doppler parameters are consistent with   abnormal left ventricular relaxation (grade 1 diastolic   dysfunction).  ASSESSMENT AND PLAN:  1. Acute on chronic diastolic CHF: I have asked her to go back up on the torsemide to 40 mg daily, except tomorrow when she will 40 mg BID. She is to take the spironolactone as directed. She is to weigh herself everyday. Hopefully will diurese 5 lbs  She will come back for follow up and BP check in 4 days. She will have a BMET next week. She is to avoid salt.   2, Hypertension: Currently controlled. No changes other than her diuretics.  Current medicines are reviewed at length with the patient today.    Labs/ tests ordered today include: BMET.   Phill Myron. West Pugh, ANP, AACC   05/15/2018 12:24 PM    [address] Office [phone] Fax [phone]

## 2018-05-15 ENCOUNTER — Ambulatory Visit (INDEPENDENT_AMBULATORY_CARE_PROVIDER_SITE_OTHER): Payer: Medicare Other | Admitting: Adult Health

## 2018-05-15 ENCOUNTER — Encounter: Payer: Self-pay | Admitting: Adult Health

## 2018-05-15 ENCOUNTER — Telehealth (HOSPITAL_COMMUNITY): Payer: Self-pay

## 2018-05-15 ENCOUNTER — Ambulatory Visit: Payer: Medicare Other | Admitting: Physician Assistant

## 2018-05-15 VITALS — BP 124/66 | HR 90 | Ht 59.0 in | Wt 219.0 lb

## 2018-05-15 DIAGNOSIS — I5033 Acute on chronic diastolic (congestive) heart failure: Secondary | ICD-10-CM

## 2018-05-15 DIAGNOSIS — Z79899 Other long term (current) drug therapy: Secondary | ICD-10-CM

## 2018-05-15 DIAGNOSIS — I10 Essential (primary) hypertension: Secondary | ICD-10-CM | POA: Diagnosis not present

## 2018-05-15 NOTE — Telephone Encounter (Signed)
information requested by the nurse case manager Tammy Mellody Memos RN was faxed 05/15/18 to number [phone]

## 2018-05-15 NOTE — Patient Instructions (Signed)
Follow-Up: You will need a follow up appointment in 1 months.  You may see Quay Burow, MD Jory Sims, DNP, AACC  or one of the following Advanced Practice Providers on your designated Care Team:  Kerin Ransom, PA-C   Roby Lofts, [place].  Medication Instructions:  TAKE SPIRONOLACTONE WHEN YOU GET HOME; THEN RESUME DAILY  TAKE TORSEMIDE AT 4PM TODAY       TOMORROW TAKE TORSEMIDE 20MG  TWICE DAILY  Thursday TAKE TORSEMIDE 20MG  TWICE DAILY  Friday BACK TO TORSEMIDE 20MG  DAILY  If you need a refill on your cardiac medications before your next appointment, please call your pharmacy.  Labwork: NONE ORDERED-IF/When you have labs (blood work) and your tests are completely normal, you will receive your results ONLY by [place] (if you have MyChart) -OR- A paper copy in the mail.  At [facility], you and your health needs are our priority.  As part of our continuing mission to provide you with exceptional heart care, we have created designated Provider Care Teams.  These Care Teams include your primary Cardiologist (physician) and Advanced Practice Providers (APPs -  Physician Assistants and Nurse Practitioners) who all work together to provide you with the care you need, when you need it.  Thank you for choosing [facility] at [facility]!!

## 2018-05-18 LAB — BASIC METABOLIC PANEL
BUN/Creatinine Ratio: 30 — ABNORMAL HIGH (ref 9–23)
BUN: 50 mg/dL — ABNORMAL HIGH (ref 6–24)
CALCIUM: 9.9 mg/dL (ref 8.7–10.2)
CHLORIDE: 96 mmol/L (ref 96–106)
CO2: 29 mmol/L (ref 20–29)
Creatinine, Ser: 1.67 mg/dL — ABNORMAL HIGH (ref 0.57–1.00)
GFR calc non Af Amer: 33 mL/min/{1.73_m2} — ABNORMAL LOW (ref >59)
GFR, EST AFRICAN AMERICAN: 38 mL/min/{1.73_m2} — AB (ref >59)
GLUCOSE: 284 mg/dL — AB (ref 65–99)
POTASSIUM: 3.9 mmol/L (ref 3.5–5.2)
Sodium: 142 mmol/L (ref 134–144)

## 2018-05-21 ENCOUNTER — Telehealth: Payer: Self-pay

## 2018-05-21 NOTE — Telephone Encounter (Signed)
Patient returned call, she is scheduled with you for nurse visit on 1/13 for BP and wt check, and scheduled f/up appt on 1/31.

## 2018-05-21 NOTE — Telephone Encounter (Signed)
PT NEED NURSE VISIT WITH ME FOR BP AND WEIGHT CHECK, ANY TIME WHEN KL IS HERE. ALSO, SHE NEEDS F/U APPT SCHEDULED ABOUT 1-31 FOR REGULAR F/U

## 2018-05-28 ENCOUNTER — Ambulatory Visit (INDEPENDENT_AMBULATORY_CARE_PROVIDER_SITE_OTHER): Payer: Medicare Other

## 2018-05-28 VITALS — BP 102/56 | HR 63 | Ht 59.0 in | Wt 215.0 lb

## 2018-05-28 DIAGNOSIS — I10 Essential (primary) hypertension: Secondary | ICD-10-CM

## 2018-05-28 DIAGNOSIS — I5032 Chronic diastolic (congestive) heart failure: Secondary | ICD-10-CM

## 2018-05-28 NOTE — Patient Instructions (Signed)
Medication Instructions:  NO CHANGES- Your physician recommends that you continue on your current medications as directed. Please refer to the Current Medication list given to you today. If you need a refill on your cardiac medications before your next appointment, please call your pharmacy.  Labwork: NONE ORDERED HERE IN OUR OFFICE AT [facility]  You will need to fast. DO NOT EAT OR DRINK PAST MIDNIGHT.     You will NOT need to fast  Take the provided lab slips with you to the lab for your blood draw.  When you have your labs (blood work) drawn today and your tests are completely normal, you will receive your results only by MyChart Message (if you have MyChart) -OR-  A paper copy in the mail.  If you have any lab test that is abnormal or we need to change your treatment, we will call you to review these results.  Special Instructions: (LAB RESULTS) Notes recorded by Lendon Colonel, NP on 05/19/2018 at 6:55 PM EST Labs reviewed. Her kidney function is improving but her blood sugar is elevated. She will need to see PCP for better control of her blood sugar. We will see her for BP and weight only this week.  Follow-Up: You will need a follow up appointment [place], PA-C.   You may see Quay Burow, MD or one of the following Advanced Practice Providers on your designated Care Team:  Kerin Ransom, [place]  Roby Lofts, PA-C   At [facility], you and your health needs are our priority.  As part of our continuing mission to provide you with exceptional heart care, we have created designated Provider Care Teams.  These Care Teams include your primary Cardiologist (physician) and Advanced Practice Providers (APPs -  Physician Assistants and Nurse Practitioners) who all work together to provide you with the care you need, when you need it.  Thank you for choosing [facility] at [facility]!!

## 2018-05-28 NOTE — Progress Notes (Signed)
PT IS HERE FOR BP AND WEIGHT CHECK PER LAST OV NOTE. SHE DID NOT BRING BP OR WEIGHT LOG WITH HER TO THIS APPT.  Pt has f/u appt scheduled with Kerin Ransom, PA-C 06-15-2018. NO NEW ORDERS PER KATHRYN LAWRENCE DNP,AACC   PT VERBALIZES UNDERSTANDING OF ALL DIRECTION AND LEFT FACILITY  ASSESSMENT AND PLAN: (per 05-15-18 PN) 1. Acute on chronic diastolic CHF: I have asked her to go back up on the torsemide to 40 mg daily, except tomorrow when she will 40 mg BID. She is to take the spironolactone as directed. She is to weigh herself everyday. Hopefully will diurese 5 lbs  She will come back for follow up and BP check in 4 days. She will have a BMET next week. She is to avoid salt.   2, Hypertension: Currently controlled. No changes other than her diuretics.  Current medicines are reviewed at length with the patient today.    Labs/ tests ordered today include: BMET.

## 2018-06-11 ENCOUNTER — Encounter: Payer: Self-pay | Admitting: Family Medicine

## 2018-06-14 ENCOUNTER — Ambulatory Visit (INDEPENDENT_AMBULATORY_CARE_PROVIDER_SITE_OTHER): Payer: Medicare Other

## 2018-06-14 ENCOUNTER — Ambulatory Visit (HOSPITAL_COMMUNITY)
Admission: EM | Admit: 2018-06-14 | Discharge: 2018-06-14 | Disposition: A | Discharge status: Home / Self Care | Payer: Medicare Other | Attending: Family Medicine | Admitting: Family Medicine

## 2018-06-14 ENCOUNTER — Encounter (HOSPITAL_COMMUNITY): Payer: Self-pay | Admitting: Emergency Medicine

## 2018-06-14 ENCOUNTER — Other Ambulatory Visit: Payer: Self-pay

## 2018-06-14 DIAGNOSIS — W2209XA Striking against other stationary object, initial encounter: Secondary | ICD-10-CM

## 2018-06-14 DIAGNOSIS — S9031XA Contusion of right foot, initial encounter: Secondary | ICD-10-CM

## 2018-06-14 NOTE — ED Provider Notes (Signed)
[place]    CSN: [id number] Arrival date & time: 06/14/18  1622     History   Chief Complaint Chief Complaint  Patient presents with  . Foot Injury    HPI Amy [name] is a 58 y.o. female.   The history is provided by the patient. No language interpreter was used.  Foot Injury  Location:  Foot Time since incident:  4 days Injury: yes   Foot location:  R foot Pain details:    Quality:  Aching   Radiates to:  Does not radiate   Severity:  Moderate   Onset quality:  Gradual   Duration:  4 days   Timing:  Constant   Progression:  Worsening Dislocation: no   Foreign body present:  No foreign bodies Relieved by:  Nothing Worsened by:  Nothing Ineffective treatments:  None tried Risk factors: no known bone disorder   Pt reports she hit her foot on Monday.  Pt reports she has continued pain  Past Medical History:  Diagnosis Date  . Arthritis   . Asthma   . Blind   . Blindness and low vision    left eye glass eye,  legally blind in right eye  . Diabetes mellitus   . Diabetic neuropathy ([place])   . Hyperlipidemia   . Hypertension   . Hypothyroidism   . Spinal stenosis     Patient Active Problem List   Diagnosis Date Noted  . Intractable nausea and vomiting 03/06/2018  . Right leg pain 12/19/2017  . Leukocytosis 12/19/2017  . Benign positional vertigo 12/19/2017  . Weakness 12/12/2017  . Obstructive sleep apnea on CPAP 02/09/2017  . Chronic diastolic heart failure ([place]) 11/08/2016  . Near syncope 01/30/2016  . SOB (shortness of breath)   . CAP (community acquired pneumonia) 09/12/2015  . Hypoxia 09/12/2015  . Hypertension 07/05/2015  . Hypotension 07/05/2015  . Diabetes mellitus with neurological manifestations ([place]) 11/04/2014  . Hyperlipidemia LDL goal <70 11/04/2014  . Spinal stenosis, multilevel 11/04/2014  . Hyperkalemia 11/01/2014  . Hypoglycemia 08/09/2014  . DM (diabetes mellitus), type 2, uncontrolled, with hyperosmolarity ([place])  08/08/2014  . Elevated troponin 08/08/2014  . Nausea vomiting and diarrhea 08/08/2014  . Dizziness 08/06/2012  . Orthostatic hypotension 08/06/2012  . Hypernatremia 08/06/2012  . Acute gastroenteritis 08/13/2011  . Gastroparesis 08/13/2011  . Chest pain 08/13/2011  . Oral thrush 08/13/2011  . Gastroenteritis 05/23/2011  . Dehydration 05/23/2011  . Blindness 05/23/2011  . DM type 1, not at goal, causing eye disease ([place]) 05/23/2011  . Asthma 05/23/2011  . Diarrhea 05/23/2011  . Vomiting 05/23/2011  . Hypothyroidism 05/23/2011  . Diabetic neuropathy ([place]) 05/23/2011  . Diabetic nephropathy ([place]) 05/23/2011    Past Surgical History:  Procedure Laterality Date  . ABDOMINAL HYSTERECTOMY  1990  . [place]   x 2  . CHOLECYSTECTOMY  2008  . ENUCLEATION Bilateral 09/15/1998  . EYE SURGERY  2016   fitting artificial eye    OB History   No obstetric history on file.    Obstetric Comments  Pt has two sons.         Home Medications    Prior to Admission medications   Medication Sig Start Date End Date Taking? Authorizing Provider  albuterol (PROVENTIL HFA;VENTOLIN HFA) 108 (90 BASE) MCG/ACT inhaler Inhale 2 puffs into the lungs every 4 (four) hours as needed for wheezing or shortness of breath. For short    [provider]  aspirin  EC 81 MG tablet Take 81 mg by mouth daily.    [provider]  esomeprazole (NEXIUM) 40 MG capsule Take 40 mg by mouth daily at 12 noon.     [provider]  fluticasone (FLONASE) 50 MCG/ACT nasal spray Place 1 spray into both nostrils daily.     [provider]  HUMALOG KWIKPEN 100 UNIT/ML KiwkPen Inject into the skin 3 (three) times daily. 12-12-17 take 8 units in morning, 10 units at lunch, 14 units at dinner 01/24/17   [provider]  insulin glargine (LANTUS) 100 UNIT/ML injection Inject 0.3 mLs (30 Units total) into the skin at bedtime. 03/07/18   Nita Sells, MD    levothyroxine (SYNTHROID, LEVOTHROID) 150 MCG tablet Take 150 mcg by mouth daily before breakfast.    [provider]  Linaclotide (LINZESS) 145 MCG CAPS capsule Take 145 mcg by mouth daily as needed (constipation).     [provider]  potassium chloride SA (K-DUR,KLOR-CON) 20 MEQ tablet Take 2 tablets (40 mEq total) by mouth daily. 11/06/17   Georgiana Shore, NP  pregabalin (LYRICA) 150 MG capsule Take 150 mg by mouth 3 (three) times daily.  01/01/18   [provider]  simvastatin (ZOCOR) 20 MG tablet Take 20 mg by mouth daily.    [provider]  spironolactone (ALDACTONE) 25 MG tablet Take 0.5 tablets (12.5 mg total) by mouth daily. 10/30/17   Shirley Friar, PA-C  torsemide (DEMADEX) 20 MG tablet Take 1 tablet (20 mg total) by mouth daily. Patient taking differently: Take 20 mg by mouth 2 (two) times daily.  03/07/18   Nita Sells, MD    Family History Family History  Problem Relation Age of Onset  . Diabetes Sister   . Glaucoma Sister   . Hypertension Sister   . Stroke Brother   . Heart attack Paternal Aunt   . Breast cancer Neg Hx     Social History Social History   Tobacco Use  . Smoking status: Former Smoker    Types: Cigarettes    Last attempt to quit: 05/22/1992    Years since quitting: 26.0  . Smokeless tobacco: Never Used  Substance Use Topics  . Alcohol use: No    Comment: quit 20 yrs ago  . Drug use: No     Allergies   Penicillins; Codeine; Iodine-131; Iohexol; Lisinopril; and Sulfa antibiotics   Review of Systems Review of Systems  All other systems reviewed and are negative.    Physical Exam Triage Vital Signs ED Triage Vitals  Enc Vitals Group     BP 06/14/18 1655 (!) 147/67     Pulse Rate 06/14/18 1655 88     Resp 06/14/18 1655 (!) 22     Temp 06/14/18 1655 99 F (37.2 C)     Temp Source 06/14/18 1655 Temporal     SpO2 06/14/18 1655 100 %     Weight --      Height --      Head  Circumference --      Peak Flow --      Pain Score 06/14/18 1652 10     Pain Loc --      Pain Edu? --      Excl. in [place]? --    No data found.  Updated Vital Signs BP (!) 147/67 (BP Location: Left Arm)   Pulse 88   Temp 99 F (37.2 C) (Temporal)   Resp (!) 22   SpO2 100%  Visual Acuity Right Eye Distance:   Left Eye Distance:   Bilateral Distance:    Right Eye Near:   Left Eye Near:    Bilateral Near:     Physical Exam Vitals signs reviewed.  HENT:     Head: Normocephalic.  Musculoskeletal:        General: Swelling and tenderness present. No deformity.  Skin:    General: Skin is warm.  Neurological:     General: No focal deficit present.     Mental Status: She is alert.  Psychiatric:        Mood and Affect: Mood normal.      UC Treatments / Results  Labs (all labs ordered are listed, but only abnormal results are displayed) Labs Reviewed - No data to display  EKG None  Radiology Dg Foot Complete Right  Result Date: 06/14/2018 CLINICAL DATA:  Foot pain after trauma 3 days ago. EXAM: RIGHT FOOT COMPLETE - 3+ VIEW COMPARISON:  None. FINDINGS: There is no evidence of fracture or dislocation. There is no evidence of arthropathy or other focal bone abnormality. Soft tissues are unremarkable. IMPRESSION: Negative. Electronically Signed   By: Dorise Bullion III M.D   On: 06/14/2018 18:11    Procedures Procedures (including critical care time)  Medications Ordered in UC Medications - No data to display  Initial Impression / Assessment and Plan / UC Course  I have reviewed the triage vital signs and the nursing notes.  Pertinent labs & imaging results that were available during my care of the patient were reviewed by me and considered in my medical decision making (see chart for details).     MDM  Xray no fracture.  Pt placed in a ace wrap and a post op shoe.   Final Clinical Impressions(s) / UC Diagnoses   Final diagnoses:  Contusion of right foot,  initial encounter     Discharge Instructions     Return if any problems.    Follow up with your MD if pain persist past one week.  ED Prescriptions    None     Controlled Substance Prescriptions [place] Controlled Substance Registry consulted? Not Applicable   Fransico Meadow, [place] 06/14/18 [id number]

## 2018-06-14 NOTE — Discharge Instructions (Signed)
Return if any problems.

## 2018-06-14 NOTE — ED Triage Notes (Addendum)
Stumped right foot on Monday.  Patient new it hurt, but aide today encouraged her to get it checked .  Lateral, right foot is sore, great toe is sore.  No obvious injury, but right foot is swollen and slight redness to great toe and surrounding area. Pulses 2 + in right foot

## 2018-06-15 ENCOUNTER — Encounter: Payer: Self-pay | Admitting: Cardiology

## 2018-06-15 ENCOUNTER — Ambulatory Visit: Payer: Medicare Other | Admitting: Cardiovascular Disease

## 2018-06-15 ENCOUNTER — Ambulatory Visit (INDEPENDENT_AMBULATORY_CARE_PROVIDER_SITE_OTHER): Payer: Medicare Other | Admitting: Medical

## 2018-06-15 VITALS — BP 118/70 | HR 74 | Ht 59.0 in | Wt 220.8 lb

## 2018-06-15 DIAGNOSIS — I5033 Acute on chronic diastolic (congestive) heart failure: Secondary | ICD-10-CM | POA: Diagnosis not present

## 2018-06-15 DIAGNOSIS — E785 Hyperlipidemia, unspecified: Secondary | ICD-10-CM

## 2018-06-15 DIAGNOSIS — Z9989 Dependence on other enabling machines and devices: Secondary | ICD-10-CM

## 2018-06-15 DIAGNOSIS — Z794 Long term (current) use of insulin: Secondary | ICD-10-CM

## 2018-06-15 DIAGNOSIS — G4733 Obstructive sleep apnea (adult) (pediatric): Secondary | ICD-10-CM | POA: Diagnosis not present

## 2018-06-15 DIAGNOSIS — E118 Type 2 diabetes mellitus with unspecified complications: Secondary | ICD-10-CM | POA: Diagnosis not present

## 2018-06-15 DIAGNOSIS — I10 Essential (primary) hypertension: Secondary | ICD-10-CM | POA: Diagnosis not present

## 2018-06-15 DIAGNOSIS — N183 Chronic kidney disease, stage 3 unspecified: Secondary | ICD-10-CM

## 2018-06-15 DIAGNOSIS — Z79899 Other long term (current) drug therapy: Secondary | ICD-10-CM

## 2018-06-15 DIAGNOSIS — IMO0001 Reserved for inherently not codable concepts without codable children: Secondary | ICD-10-CM

## 2018-06-15 LAB — BASIC METABOLIC PANEL
BUN/Creatinine Ratio: 21 (ref 9–23)
BUN: 30 mg/dL — ABNORMAL HIGH (ref 6–24)
CO2: 25 mmol/L (ref 20–29)
Calcium: 8.9 mg/dL (ref 8.7–10.2)
Chloride: 102 mmol/L (ref 96–106)
Creatinine, Ser: 1.45 mg/dL — ABNORMAL HIGH (ref 0.57–1.00)
GFR calc Af Amer: 45 mL/min/{1.73_m2} — ABNORMAL LOW (ref >59)
GFR calc non Af Amer: 39 mL/min/{1.73_m2} — ABNORMAL LOW (ref >59)
Glucose: 154 mg/dL — ABNORMAL HIGH (ref 65–99)
Potassium: 4.1 mmol/L (ref 3.5–5.2)
Sodium: 144 mmol/L (ref 134–144)

## 2018-06-15 NOTE — Progress Notes (Signed)
Cardiology Office Note   Date:  06/15/2018   ID:  Amy [name], Amy [name] May 14, 1961, MRN [id number]  PCP:  Velna Hatchet, MD  Cardiologist:  Quay Burow, MD EP: None  Chief Complaint  Patient presents with  . Follow-up    CHF      History of Present Illness: Amy [name] is a 58 y.o. female with PMH of chronic diastolic CHF, HTN, HLD, CKD stage 3, OSA, legal blindnesswho presents for follow-up of her CHF.  She was last seen by cardiology, Jory Sims NP, outpatient 05/15/2018 at which time she was felt to be volume overloaded in the setting of dietary indiscretion. She was recommended to increase her lasix to 40mg  BID x1 day, then 40mg  daily. She returned 05/28/2018 for a weight and BP check with improvement in weight from 219lbs down to 215lbs. Her last echocardiogram 03/2018 showed EF 60-65% and G1DD.   She returns today for follow-up of her CHF. Unfortunately her weight is back up to 220lbs today. She reports feeling SOB, DOE, orthopnea, and LE edema, slightly worse than when she was seen 1 month ago. She continues to eat pre-packaged meals frequently. We discussed the importance of limiting salt intake to prevent fluid buildup. She also reports some days where she feels like she makes less urine. She states she was unaware that she had any problem with her kidneys. She denies chest pain, dizziness, lightheadedness, or syncope.     Past Medical History:  Diagnosis Date  . Arthritis   . Asthma   . Blind   . Blindness and low vision    left eye glass eye,  legally blind in right eye  . Diabetes mellitus   . Diabetic neuropathy ([place])   . Hyperlipidemia   . Hypertension   . Hypothyroidism   . Spinal stenosis     Past Surgical History:  Procedure Laterality Date  . ABDOMINAL HYSTERECTOMY  1990  . [place]   x 2  . CHOLECYSTECTOMY  2008  . ENUCLEATION Bilateral 09/15/1998  . EYE SURGERY  2016   fitting artificial eye     Current  Outpatient Medications  Medication Sig Dispense Refill  . albuterol (PROVENTIL HFA;VENTOLIN HFA) 108 (90 BASE) MCG/ACT inhaler Inhale 2 puffs into the lungs every 4 (four) hours as needed for wheezing or shortness of breath. For short    . aspirin EC 81 MG tablet Take 81 mg by mouth daily.    . cetirizine (ZYRTEC) 10 MG tablet TK 1 T PO QD    . esomeprazole (NEXIUM) 40 MG capsule Take 40 mg by mouth daily at 12 noon.     . fluticasone (FLONASE) 50 MCG/ACT nasal spray Place 1 spray into both nostrils daily.     Marland Kitchen HUMALOG KWIKPEN 100 UNIT/ML KiwkPen Inject into the skin 3 (three) times daily. 12-12-17 take 8 units in morning, 10 units at lunch, 14 units at dinner    . insulin glargine (LANTUS) 100 UNIT/ML injection Inject 0.3 mLs (30 Units total) into the skin at bedtime. 10 mL 11  . levothyroxine (SYNTHROID, LEVOTHROID) 150 MCG tablet Take 150 mcg by mouth daily before breakfast.    . Linaclotide (LINZESS) 145 MCG CAPS capsule Take 145 mcg by mouth daily as needed (constipation).     . potassium chloride SA (K-DUR,KLOR-CON) 20 MEQ tablet Take 2 tablets (40 mEq total) by mouth daily. 60 tablet 3  . pregabalin (LYRICA) 150 MG capsule Take 150 mg by mouth  3 (three) times daily.     . simvastatin (ZOCOR) 20 MG tablet Take 20 mg by mouth daily.    Marland Kitchen spironolactone (ALDACTONE) 25 MG tablet Take 0.5 tablets (12.5 mg total) by mouth daily. 90 tablet 3  . torsemide (DEMADEX) 20 MG tablet Take 1 tablet (20 mg total) by mouth daily. (Patient taking differently: Take 20 mg by mouth 2 (two) times daily. ) 60 tablet 11  . TRESIBA FLEXTOUCH 200 UNIT/ML SOPN Inject 60 Units into the skin at bedtime.     . triamcinolone cream (KENALOG) 0.1 %      No current facility-administered medications for this visit.     Allergies:   Penicillins; Codeine; Iodine-131; Iohexol; Lisinopril; and Sulfa antibiotics    Social History:  The patient  reports that she quit smoking about 26 years ago. Her smoking use included  cigarettes. She has never used smokeless tobacco. She reports that she does not drink alcohol or use drugs.   Family History:  The patient's family history includes Diabetes in her sister; Glaucoma in her sister; Heart attack in her paternal aunt; Hypertension in her sister; Stroke in her brother.    ROS:  Please see the history of present illness.   Otherwise, review of systems are positive for none.   All other systems are reviewed and negative.    PHYSICAL EXAM: VS:  BP 118/70   Pulse 74   Ht 4\' 11"  (1.499 m)   Wt 220 lb 12.8 oz (100.2 kg)   SpO2 97%   BMI 44.60 kg/m  , BMI Body mass index is 44.6 kg/m. GEN: Well nourished, well developed, in no acute distress HEENT: sclera anicteric, legally blind Neck: no JVD, carotid bruits, or masses Cardiac: RRR; no murmurs, rubs, or gallops, 1-2+ LE edema  Respiratory:  Decreased breath sounds at lung bases without overt wheezes, rales, or rhonchi GI: soft, obese, nontender, nondistended, + BS MS: no deformity or atrophy Skin: warm and dry, no rash Neuro:  Strength and sensation are intact Psych: euthymic mood, full affect   EKG:  EKG is not ordered today.    Recent Labs: 12/11/2017: TSH 0.279 03/07/2018: ALT 26; Hemoglobin 11.3; Platelets 154 04/05/2018: B Natriuretic Peptide 48.9 05/18/2018: BUN 50; Creatinine, Ser 1.67; Potassium 3.9; Sodium 142    Lipid Panel    Component Value Date/Time   CHOL 170 10/22/2012 1351   TRIG 112 10/22/2012 1351   HDL 56 10/22/2012 1351   CHOLHDL 3.0 10/22/2012 1351   VLDL 22 10/22/2012 1351   LDLCALC 92 10/22/2012 1351      Wt Readings from Last 3 Encounters:  06/15/18 220 lb 12.8 oz (100.2 kg)  05/28/18 215 lb (97.5 kg)  05/15/18 219 lb (99.3 kg)      Other studies Reviewed: Additional studies/ records that were reviewed today include:   Echocardiogram 03/2018: Study Conclusions  - Left ventricle: The cavity size was normal. Systolic function was   normal. The estimated  ejection fraction was in the range of 60%   to 65%. Wall motion was normal; there were no regional wall   motion abnormalities. Doppler parameters are consistent with   abnormal left ventricular relaxation (grade 1 diastolic   dysfunction).    ASSESSMENT AND PLAN:  1. Acute on chronic diastolic CHF: she continues to have volume overload complaints despite taking torsemide 20mg  BID for the past month. Weight has also gone up from 215lbs 05/28/2018 to 220lbs today. Suspect dietary non-compliance given poor insight to HF diagnosis.  CKD has made diuresis more challenging.  - Will check a BMET today - Will increase torsemide to 40mg  BID x3 days and potassium 40mg  TID x3 days, then resume torsemide 20mg  BID dosing.  - Will have her follow-up next week for repeat lab work and follow-up with an APP for ongoing CHF management. - Encouraged patient to limit salt intake to 1.5-2g/day and limit fluid intake to <2L per day.  - Encouraged patient to continue monitoring weight daily and to notify the office if she gains more than 3lbs in 1 day or 5lbs in 1 week.   2. HTN: BP well controlled with management of CHF - Continue spironolactone and torsemide  3. CKD stage 3: Cr 1.67 05/18/2018.  - Will repeat BMET today - If Cr continues to uptrend, may benefit from referral to nephrology given difficulty obtaining euvolemic status  4. OSA: on CPAP - Continue to encourage nightly CPAP use.      Current medicines are reviewed at length with the patient today.  The patient does not have concerns regarding medicines.  The following changes have been made:  Will increase torsemide to 40mg  BID x3 days and potassium 40mg  TID x3 days, then resume torsemide 20mg  BID dosing.  Labs/ tests ordered today include:   Orders Placed This Encounter  Procedures  . Basic metabolic panel  . Basic metabolic panel     Disposition:   FU with an APP on Dr. Kennon Holter team in 1 week  Signed, Abigail Butts, PA-C    06/15/2018 11:32 AM

## 2018-06-15 NOTE — Patient Instructions (Signed)
Medication Instructions:  FOR THE NEXT 3 DAYS - TAKE 40 MG OF TORSEMIDE TWICE A DAY  ALONG WITH THE ABOVETAKE POTASSIUM 20 MEQ  THREE TIMES A DAY FOR 3 DAYS  THEN GO BACK TO REGULAR DOSE. If you need a refill on your cardiac medications before your next appointment, please call your pharmacy.   Lab work: TODAY- BMP  NEXT WEEK - BMP   If you have labs (blood work) drawn today and your tests are completely normal, you will receive your results only by: Marland Kitchen MyChart Message (if you have MyChart) OR . A paper copy in the mail If you have any lab test that is abnormal or we need to change your treatment, we will call you to review the results.  Testing/Procedures: NOT NEEDED  Follow-Up: At [facility], you and your health needs are our priority.  As part of our continuing mission to provide you with exceptional heart care, we have created designated Provider Care Teams.  These Care Teams include your primary Cardiologist (physician) and Advanced Practice Providers (APPs -  Physician Assistants and Nurse Practitioners) who all work together to provide you with the care you need, when you need it. . Your physician recommends that you schedule a follow-up appointment in [place] .   Any Other Special Instructions Will Be Listed Below (If Applicable).

## 2018-06-20 LAB — BASIC METABOLIC PANEL
BUN/Creatinine Ratio: 31 — ABNORMAL HIGH (ref 9–23)
BUN: 60 mg/dL — ABNORMAL HIGH (ref 6–24)
CO2: 27 mmol/L (ref 20–29)
Calcium: 9.7 mg/dL (ref 8.7–10.2)
Chloride: 100 mmol/L (ref 96–106)
Creatinine, Ser: 1.92 mg/dL — ABNORMAL HIGH (ref 0.57–1.00)
GFR calc Af Amer: 32 mL/min/{1.73_m2} — ABNORMAL LOW (ref >59)
GFR calc non Af Amer: 28 mL/min/{1.73_m2} — ABNORMAL LOW (ref >59)
Glucose: 101 mg/dL — ABNORMAL HIGH (ref 65–99)
Potassium: 4.5 mmol/L (ref 3.5–5.2)
Sodium: 146 mmol/L — ABNORMAL HIGH (ref 134–144)

## 2018-06-21 ENCOUNTER — Ambulatory Visit: Payer: Medicare Other | Admitting: Cardiology

## 2018-06-22 ENCOUNTER — Other Ambulatory Visit: Payer: Self-pay

## 2018-06-22 DIAGNOSIS — I5032 Chronic diastolic (congestive) heart failure: Secondary | ICD-10-CM

## 2018-06-22 NOTE — Progress Notes (Signed)
bmet  

## 2018-06-27 ENCOUNTER — Other Ambulatory Visit: Payer: Medicare Other

## 2018-06-28 ENCOUNTER — Other Ambulatory Visit: Payer: Medicare Other

## 2018-06-28 LAB — BASIC METABOLIC PANEL
BUN/Creatinine Ratio: 34 — ABNORMAL HIGH (ref 9–23)
BUN: 62 mg/dL — ABNORMAL HIGH (ref 6–24)
CO2: 25 mmol/L (ref 20–29)
Calcium: 9.7 mg/dL (ref 8.7–10.2)
Chloride: 102 mmol/L (ref 96–106)
Creatinine, Ser: 1.85 mg/dL — ABNORMAL HIGH (ref 0.57–1.00)
GFR calc Af Amer: 34 mL/min/{1.73_m2} — ABNORMAL LOW (ref >59)
GFR calc non Af Amer: 29 mL/min/{1.73_m2} — ABNORMAL LOW (ref >59)
Glucose: 116 mg/dL — ABNORMAL HIGH (ref 65–99)
Potassium: 5.1 mmol/L (ref 3.5–5.2)
Sodium: 142 mmol/L (ref 134–144)

## 2018-06-28 NOTE — Progress Notes (Signed)
Spoke with patient and asked about the Torsemide 20 mg BID. Patient stated that she is taking 1 tablet in the mornings and 1 in the evenings. I informed the patient to keep her f/u with [facility].

## 2018-06-29 ENCOUNTER — Other Ambulatory Visit: Payer: Self-pay

## 2018-06-29 MED ORDER — TORSEMIDE 20 MG PO TABS: 20.0000 mg | ORAL_TABLET | Freq: Two times a day (BID) | ORAL | 1 refills | Status: DC

## 2018-07-03 ENCOUNTER — Other Ambulatory Visit (HOSPITAL_COMMUNITY): Payer: Self-pay | Admitting: Student

## 2018-07-04 ENCOUNTER — Ambulatory Visit: Payer: Medicare Other | Admitting: Podiatry

## 2018-07-05 ENCOUNTER — Telehealth: Payer: Self-pay | Admitting: Cardiovascular Disease

## 2018-07-05 ENCOUNTER — Ambulatory Visit: Payer: Medicare Other | Admitting: Cardiology

## 2018-07-05 NOTE — Telephone Encounter (Signed)
New Message °

## 2018-07-05 NOTE — Telephone Encounter (Signed)
Received call from [employer] with [facility].She was calling to report patient has gained 2.8 lbs in the last 3 days.Patient feels like fluid starting to build back up.She is sob and has a non productive cough.She had to cancel appointment with Kerin Ransom PA today due to no transportation.Spoke to DOD Dr.Christopher she advised if she is not urinating much she can increase Torsemide 20 mg 2 tablets in am and continue 20 mg in pm.She stated she is not urinating much.She will increase Torsemide to 2 tablets in am and continue 20 mg in pm.Appointment scheduled with Jory Sims DNP Mon 07/09/18 at 3:30 pm.

## 2018-07-08 NOTE — Progress Notes (Signed)
Cardiology Office Note   Date:  07/09/2018   ID:  Amy [name], Amy [name] 1960-11-25, MRN [id number]  PCP:  Velna Hatchet, MD  Cardiologist:  Dr. Gwenlyn Found  Chief Complaint  Patient presents with  . Congestive Heart Failure  . Hypertension     History of Present Illness: Amy [name] is a 58 y.o. female who presents for ongoing assessment and management of chronic diastolic CHF, HTN, HLD, CKD Stage 3, and OSA. Dry weight of 215 lbs. She followed up with Cherlynn Polo, PA on 06/15/2018 and was found to have gained weight up to 220 lbs.She was more dyspneic with PND and LEE. She had not limited her salt intake and continued to eat pre-packaged foods.   She was increased on her dose of torsemide to 40 mg daily for 3 days, along with increased dose of potassium to 40 mEq TID, and then reduce torsemide to 20 mg daily. Unfortunately, despite initially losing weight, she again had dietary non-compliance. Her nurse from [facility] called our office on 07/05/2018 to report 3 lbs weight gain, with associated symptoms of dyspnea, edema and cough. She was advised to increase her torsemide to 40 mg in the am and keep it at 20 mg in the pm. She is here for close follow up.   She comes today for chronic coughing.She does not know what color it is because she is blind. She is has a lot of nasal congestion. Feels like fluid in her throat. She denies wheezing or increased dyspnea. She states that she does not restrict her salt intake.   Past Medical History:  Diagnosis Date  . Arthritis   . Asthma   . Blind   . Blindness and low vision    left eye glass eye,  legally blind in right eye  . Diabetes mellitus   . Diabetic neuropathy ([place])   . Hyperlipidemia   . Hypertension   . Hypothyroidism   . Spinal stenosis     Past Surgical History:  Procedure Laterality Date  . ABDOMINAL HYSTERECTOMY  1990  . [place]   x 2  . CHOLECYSTECTOMY  2008  . ENUCLEATION Bilateral 09/15/1998  . EYE SURGERY   2016   fitting artificial eye     Current Outpatient Medications  Medication Sig Dispense Refill  . albuterol (PROVENTIL HFA;VENTOLIN HFA) 108 (90 BASE) MCG/ACT inhaler Inhale 2 puffs into the lungs every 4 (four) hours as needed for wheezing or shortness of breath. For short    . aspirin EC 81 MG tablet Take 81 mg by mouth daily.    . cetirizine (ZYRTEC) 10 MG tablet TK 1 T PO QD    . esomeprazole (NEXIUM) 40 MG capsule Take 40 mg by mouth daily at 12 noon.     . fluticasone (FLONASE) 50 MCG/ACT nasal spray Place 1 spray into both nostrils daily.     Marland Kitchen HUMALOG KWIKPEN 100 UNIT/ML KiwkPen Inject into the skin 3 (three) times daily. 12-12-17 take 8 units in morning, 10 units at lunch, 14 units at dinner    . levothyroxine (SYNTHROID, LEVOTHROID) 150 MCG tablet Take 150 mcg by mouth daily before breakfast.    . Linaclotide (LINZESS) 145 MCG CAPS capsule Take 145 mcg by mouth daily as needed (constipation).     . potassium chloride SA (K-DUR,KLOR-CON) 20 MEQ tablet TAKE 1 TABLET (20 MEQ TOTAL) BY MOUTH DAILY 65 tablet 2  . pregabalin (LYRICA) 150 MG capsule Take 150 mg by mouth 3 (  three) times daily.     . simvastatin (ZOCOR) 20 MG tablet Take 20 mg by mouth daily.    Marland Kitchen spironolactone (ALDACTONE) 25 MG tablet Take 0.5 tablets (12.5 mg total) by mouth daily. 90 tablet 3  . torsemide (DEMADEX) 20 MG tablet Take 1-1/2 tablets ( 30 mg ) in am and 1 tablet ( 20 mg ) in pm 180 tablet 3  . TRESIBA FLEXTOUCH 200 UNIT/ML SOPN Inject 60 Units into the skin at bedtime.     . triamcinolone cream (KENALOG) 0.1 %     . guaiFENesin (MUCINEX) 600 MG 12 hr tablet Take 1 tablet (600 mg total) by mouth 2 (two) times daily. 60 tablet 0   No current facility-administered medications for this visit.     Allergies:   Penicillins; Codeine; Iodine-131; Iohexol; Lisinopril; and Sulfa antibiotics    Social History:  The patient  reports that she quit smoking about 26 years ago. Her smoking use included cigarettes.  She has never used smokeless tobacco. She reports that she does not drink alcohol or use drugs.   Family History:  The patient's family history includes Diabetes in her sister; Glaucoma in her sister; Heart attack in her paternal aunt; Hypertension in her sister; Stroke in her brother.    ROS: All other systems are reviewed and negative. Unless otherwise mentioned in H&P    PHYSICAL EXAM: VS:  BP (!) 104/56   Pulse 72   Ht 4\' 11"  (1.499 m)   Wt 214 lb 9.6 oz (97.3 kg)   SpO2 97%   BMI 43.34 kg/m  , BMI Body mass index is 43.34 kg/m. GEN: Well nourished, well developed, in no acute distress HEENT: normal Neck: no JVD, carotid bruits, or masses Cardiac: RRR; no murmurs, rubs, or gallops,no edema  Respiratory:  Clear to auscultation bilaterally, normal work of breathing GI: soft, nontender, nondistended, + BS MS: no deformity or atrophy Skin: warm and dry, no rash Neuro:  Strength and sensation are intact Psych: euthymic mood, full affect   EKG:  Not completed this office visit   Recent Labs: 12/11/2017: TSH 0.279 03/07/2018: ALT 26; Hemoglobin 11.3; Platelets 154 04/05/2018: B Natriuretic Peptide 48.9 06/27/2018: BUN 62; Creatinine, Ser 1.85; Potassium 5.1; Sodium 142    Lipid Panel    Component Value Date/Time   CHOL 170 10/22/2012 1351   TRIG 112 10/22/2012 1351   HDL 56 10/22/2012 1351   CHOLHDL 3.0 10/22/2012 1351   VLDL 22 10/22/2012 1351   LDLCALC 92 10/22/2012 1351      Wt Readings from Last 3 Encounters:  07/09/18 214 lb 9.6 oz (97.3 kg)  06/15/18 220 lb 12.8 oz (100.2 kg)  05/28/18 215 lb (97.5 kg)      Other studies Reviewed: Echocardiogram 04/26/2018: Study Conclusions  - Left ventricle: The cavity size was normal. Systolic function was normal. The estimated ejection fraction was in the range of 60% to 65%. Wall motion was normal; there were no regional wall motion abnormalities. Doppler parameters are consistent with abnormal left  ventricular relaxation (grade 1 diastolic dysfunction).  ASSESSMENT AND PLAN:  1. Chronic cough: She has what appears to be sinus drainage and pos nasal drip symptoms. She will be placed on OTC Zyrtec and Muccinex for symptomatic relief.   2. Chronic Diastolic CHF: She will continue to take torsemide 40 mg in am but increase the dose to 50 mg, by taking an additional 1/2 tablet in the am and continue 20 mg in the pm. She  is advised on sodium restriction but I doubt she will adhere to this. Continue daily weights and HHN assessments. Call for weight gain  3. Hypertension: BP is currently stable. No changes   Current medicines are reviewed at length with the patient today.    Labs/ tests ordered today include: None  Phill Myron. [place] Pugh, ANP, AACC   07/09/2018 4:40 PM    [facility] [address] Office [phone] Fax [phone]

## 2018-07-09 ENCOUNTER — Encounter: Payer: Self-pay | Admitting: Adult Health

## 2018-07-09 ENCOUNTER — Ambulatory Visit (INDEPENDENT_AMBULATORY_CARE_PROVIDER_SITE_OTHER): Payer: Medicare Other | Admitting: Adult Health

## 2018-07-09 VITALS — BP 104/56 | HR 72 | Ht 59.0 in | Wt 214.6 lb

## 2018-07-09 DIAGNOSIS — I10 Essential (primary) hypertension: Secondary | ICD-10-CM

## 2018-07-09 DIAGNOSIS — I5032 Chronic diastolic (congestive) heart failure: Secondary | ICD-10-CM

## 2018-07-09 DIAGNOSIS — R05 Cough: Secondary | ICD-10-CM | POA: Diagnosis not present

## 2018-07-09 DIAGNOSIS — Z91119 Patient's noncompliance with dietary regimen due to unspecified reason: Secondary | ICD-10-CM

## 2018-07-09 DIAGNOSIS — R059 Cough, unspecified: Secondary | ICD-10-CM

## 2018-07-09 DIAGNOSIS — Z9111 Patient's noncompliance with dietary regimen: Secondary | ICD-10-CM

## 2018-07-09 MED ORDER — TORSEMIDE 20 MG PO TABS: ORAL_TABLET | ORAL | 3 refills | Status: DC

## 2018-07-09 MED ORDER — GUAIFENESIN ER 600 MG PO TB12: 600.0000 mg | ORAL_TABLET | Freq: Two times a day (BID) | ORAL | 0 refills | Status: DC

## 2018-07-09 NOTE — Patient Instructions (Signed)
Medication Instructions:  INCREASE TORSEMIDE 30MG  (1-1/2 TAB) IN THE AM AND 20MG  IN THE PM MAKE SURE TO TAKE ZYRTEC DAILY TAKE MUCINEX EVERY 12 HOURS AS NEEDED If you need a refill on your cardiac medications before your next appointment, please call your pharmacy.  Labwork: NONE When you have your labs (blood work) drawn today and your tests are completely normal, you will receive your results only by MyChart Message (if you have MyChart) -OR-  A paper copy in the mail.  If you have any lab test that is abnormal or we need to change your treatment, we will call you to review these results.  Follow-Up: You will need a follow up appointment [place].  You may see Quay Burow, MD or one of the following Advanced Practice Providers on your designated Care Team:  Kerin Ransom, PA-C  Roby Lofts, PA-C  Sande Rives, [place]   At [facility], you and your health needs are our priority.  As part of our continuing mission to provide you with exceptional heart care, we have created designated Provider Care Teams.  These Care Teams include your primary Cardiologist (physician) and Advanced Practice Providers (APPs -  Physician Assistants and Nurse Practitioners) who all work together to provide you with the care you need, when you need it.  Thank you for choosing [facility] at [facility]!!

## 2018-07-11 ENCOUNTER — Encounter: Payer: Self-pay | Admitting: Podiatry

## 2018-07-11 ENCOUNTER — Ambulatory Visit (INDEPENDENT_AMBULATORY_CARE_PROVIDER_SITE_OTHER): Payer: Medicare Other | Admitting: Podiatry

## 2018-07-11 DIAGNOSIS — M79676 Pain in unspecified toe(s): Secondary | ICD-10-CM

## 2018-07-11 DIAGNOSIS — B351 Tinea unguium: Secondary | ICD-10-CM | POA: Diagnosis not present

## 2018-07-11 DIAGNOSIS — M2041 Other hammer toe(s) (acquired), right foot: Secondary | ICD-10-CM

## 2018-07-11 DIAGNOSIS — Z794 Long term (current) use of insulin: Secondary | ICD-10-CM

## 2018-07-11 DIAGNOSIS — L608 Other nail disorders: Secondary | ICD-10-CM | POA: Diagnosis not present

## 2018-07-11 DIAGNOSIS — M2042 Other hammer toe(s) (acquired), left foot: Secondary | ICD-10-CM

## 2018-07-11 DIAGNOSIS — E1140 Type 2 diabetes mellitus with diabetic neuropathy, unspecified: Secondary | ICD-10-CM | POA: Diagnosis not present

## 2018-07-11 DIAGNOSIS — L84 Corns and callosities: Secondary | ICD-10-CM

## 2018-07-11 NOTE — Progress Notes (Addendum)
Patient ID: Amy [name], female   DOB: Sep 19, 1960, 58 y.o.   MRN: [id number] Complaint:  Visit Type: Patient returns to my office for continued preventative foot care services. Complaint: Patient states" my nails have grown long and thick and become painful to walk and wear shoes" Patient has been diagnosed with DM with neuropathy.. The patient presents for preventative foot care services. No changes to ROS.  Patient says she has painful skin lesion on the inside of her right heel. Patient is taking lyrica.   Podiatric Exam: Vascular: dorsalis pedis and posterior tibial pulses are palpable bilateral. Capillary return is immediate. Temperature gradient is WNL. Skin turgor WNL  Sensorium: Diminished  Semmes Weinstein monofilament test. Normal tactile sensation bilaterally. Nail Exam: Pt has thick disfigured discolored nails with subungual debris noted bilateral entire nail hallux through fifth toenails Ulcer Exam: There is no evidence of ulcer or pre-ulcerative changes or infection. Orthopedic Exam: Muscle tone and strength are WNL. No limitations in general ROM. No crepitus or effusions noted. Hammer toes  B/L  . Bony prominences are unremarkable. Skin: No Porokeratosis. No infection or ulcers.  Asymptomatic plantar fibroma left arch. Preulcerous callus medial plantar aspect right hee.  No drainage or infection.  Diagnosis:  Onychomycosis, , Pain in right toe, pain in left toes  Treatment & Plan Procedures and Treatment: Consent by patient was obtained for treatment procedures. The patient understood the discussion of treatment and procedures well. All questions were answered thoroughly reviewed. Debridement of mycotic and hypertrophic toenails, 1 through 5 bilateral and clearing of subungual debris. No ulceration, no infection noted. Her padding for her heel callus was sitting on the lateral aspect and moved to medial side of right heel. Patient to make an appointment with Liliane Channel.  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 Return Visit-Office Procedure: Patient instructed to return to the office for a follow up visit 10 weeks  for continued evaluation and treatment.    Gardiner Barefoot DPM

## 2018-07-26 ENCOUNTER — Encounter: Payer: Self-pay | Admitting: Cardiology

## 2018-07-26 ENCOUNTER — Ambulatory Visit (INDEPENDENT_AMBULATORY_CARE_PROVIDER_SITE_OTHER): Payer: Medicare Other | Admitting: Cardiology

## 2018-07-26 ENCOUNTER — Other Ambulatory Visit: Payer: Self-pay

## 2018-07-26 VITALS — BP 118/68 | HR 82 | Ht 59.0 in | Wt 215.0 lb

## 2018-07-26 DIAGNOSIS — N183 Chronic kidney disease, stage 3 unspecified: Secondary | ICD-10-CM

## 2018-07-26 DIAGNOSIS — I5032 Chronic diastolic (congestive) heart failure: Secondary | ICD-10-CM

## 2018-07-26 DIAGNOSIS — R0602 Shortness of breath: Secondary | ICD-10-CM | POA: Diagnosis not present

## 2018-07-26 DIAGNOSIS — Z9989 Dependence on other enabling machines and devices: Secondary | ICD-10-CM

## 2018-07-26 DIAGNOSIS — R5383 Other fatigue: Secondary | ICD-10-CM

## 2018-07-26 DIAGNOSIS — R05 Cough: Secondary | ICD-10-CM

## 2018-07-26 DIAGNOSIS — IMO0001 Reserved for inherently not codable concepts without codable children: Secondary | ICD-10-CM

## 2018-07-26 DIAGNOSIS — R059 Cough, unspecified: Secondary | ICD-10-CM

## 2018-07-26 DIAGNOSIS — Z794 Long term (current) use of insulin: Secondary | ICD-10-CM

## 2018-07-26 DIAGNOSIS — G4733 Obstructive sleep apnea (adult) (pediatric): Secondary | ICD-10-CM

## 2018-07-26 DIAGNOSIS — E118 Type 2 diabetes mellitus with unspecified complications: Secondary | ICD-10-CM

## 2018-07-26 LAB — BASIC METABOLIC PANEL
BUN/Creatinine Ratio: 30 — ABNORMAL HIGH (ref 9–23)
BUN: 55 mg/dL — ABNORMAL HIGH (ref 6–24)
CO2: 27 mmol/L (ref 20–29)
Calcium: 9.9 mg/dL (ref 8.7–10.2)
Chloride: 96 mmol/L (ref 96–106)
Creatinine, Ser: 1.83 mg/dL — ABNORMAL HIGH (ref 0.57–1.00)
GFR calc Af Amer: 34 mL/min/{1.73_m2} — ABNORMAL LOW (ref >59)
GFR calc non Af Amer: 30 mL/min/{1.73_m2} — ABNORMAL LOW (ref >59)
Glucose: 156 mg/dL — ABNORMAL HIGH (ref 65–99)
Potassium: 4.2 mmol/L (ref 3.5–5.2)
Sodium: 141 mmol/L (ref 134–144)

## 2018-07-26 LAB — CBC
Hematocrit: 39.4 % (ref 34.0–46.6)
Hemoglobin: 12.5 g/dL (ref 11.1–15.9)
MCH: 25.5 pg — ABNORMAL LOW (ref 26.6–33.0)
MCHC: 31.7 g/dL (ref 31.5–35.7)
MCV: 80 fL (ref 79–97)
Platelets: 169 10*3/uL (ref 150–450)
RBC: 4.9 x10E6/uL (ref 3.77–5.28)
RDW: 14.7 % (ref 11.7–15.4)
WBC: 10.2 10*3/uL (ref 3.4–10.8)

## 2018-07-26 LAB — PRO B NATRIURETIC PEPTIDE: NT-Pro BNP: 120 pg/mL (ref 0–287)

## 2018-07-26 MED ORDER — FUROSEMIDE 40 MG PO TABS: 40.0000 mg | ORAL_TABLET | Freq: Two times a day (BID) | ORAL | 3 refills | Status: DC

## 2018-07-26 MED ORDER — METOLAZONE 2.5 MG PO TABS: 2.5000 mg | ORAL_TABLET | Freq: Every day | ORAL | 3 refills | Status: DC

## 2018-07-26 NOTE — Assessment & Plan Note (Signed)
Not compliant with C-pap

## 2018-07-26 NOTE — Patient Instructions (Signed)
Medication Instructions:  STOP Torsemide START Lasix 40mg  take 1 tablet twice a day  START Metolazone 2.5mg  take 1 tablet daily 30 mintues before Lasix. If you need a refill on your cardiac medications before your next appointment, please call your pharmacy.   Lab work: Your physician recommends that you return for lab work in: TODAY-BMET, BNP, CBC If you have labs (blood work) drawn today and your tests are completely normal, you will receive your results only by: Marland Kitchen MyChart Message (if you have MyChart) OR . A paper copy in the mail If you have any lab test that is abnormal or we need to change your treatment, we will call you to review the results.  Testing/Procedures: NONE   Follow-Up: At [facility], you and your health needs are our priority.  As part of our continuing mission to provide you with exceptional heart care, we have created designated Provider Care Teams.  These Care Teams include your primary Cardiologist (physician) and Advanced Practice Providers (APPs -  Physician Assistants and Nurse Practitioners) who all work together to provide you with the care you need, when you need it.  . Your physician recommends that you schedule a follow-up appointment in: [place]  Any Other Special Instructions Will Be Listed Below (If Applicable).

## 2018-07-26 NOTE — Assessment & Plan Note (Signed)
Echo Nov 2019- EF 60-65%, grade 1DD Her weight is not far off her presumed dry weight. Very difficult to tell by exam if she is volume overloaded

## 2018-07-26 NOTE — Assessment & Plan Note (Signed)
Check BMP .  

## 2018-07-26 NOTE — Progress Notes (Signed)
07/26/2018 Amy [name]   06-01-1960  [id number]  Primary Physician Velna Hatchet, MD Primary Cardiologist: Dr Berry-Dr Haroldine Laws CHF  HPI: [name] is a 58 year old morbidly obese African-American female with a history of diabetes, legal blindness, hypertension, sleep apnea, chronic renal insufficiency stage III, and diastolic heart failure.  Echocardiogram in November 2019 revealed an EF of 55 to 60% with grade 1 diastolic dysfunction.  She was just in the office 07/09/2018 with complaining of dyspnea and lower extremity edema.  Her weight was 214 pounds.  She admitted to eating some high sodium foods.  Her torsemide was adjusted.  She was placed on my schedule for follow-up.  The patient tells me the "fluid pill does not work".  She does not urinate much during the day but she does urinate a lot at night.  She says she has lower extremity edema, cough, and generally just does not feel well.  Her weight today is 215 pounds.  She asked if she could be admitted "to get this fluid off".    Current Outpatient Medications  Medication Sig Dispense Refill  . albuterol (PROVENTIL HFA;VENTOLIN HFA) 108 (90 BASE) MCG/ACT inhaler Inhale 2 puffs into the lungs every 4 (four) hours as needed for wheezing or shortness of breath. For short    . aspirin EC 81 MG tablet Take 81 mg by mouth daily.    . cetirizine (ZYRTEC) 10 MG tablet TK 1 T PO QD    . esomeprazole (NEXIUM) 40 MG capsule Take 40 mg by mouth daily at 12 noon.     . fluticasone (FLONASE) 50 MCG/ACT nasal spray Place 1 spray into both nostrils daily.     Marland Kitchen guaiFENesin (MUCINEX) 600 MG 12 hr tablet Take 1 tablet (600 mg total) by mouth 2 (two) times daily. 60 tablet 0  . HUMALOG KWIKPEN 100 UNIT/ML KiwkPen Inject into the skin 3 (three) times daily. 12-12-17 take 8 units in morning, 10 units at lunch, 14 units at dinner    . levothyroxine (SYNTHROID, LEVOTHROID) 150 MCG tablet Take 150 mcg by mouth daily before breakfast.    . Linaclotide  (LINZESS) 145 MCG CAPS capsule Take 145 mcg by mouth daily as needed (constipation).     . potassium chloride SA (K-DUR,KLOR-CON) 20 MEQ tablet TAKE 1 TABLET (20 MEQ TOTAL) BY MOUTH DAILY 65 tablet 2  . pregabalin (LYRICA) 150 MG capsule Take 150 mg by mouth 3 (three) times daily.     . simvastatin (ZOCOR) 20 MG tablet Take 20 mg by mouth daily.    Marland Kitchen spironolactone (ALDACTONE) 25 MG tablet Take 0.5 tablets (12.5 mg total) by mouth daily. 90 tablet 3  . TRESIBA FLEXTOUCH 200 UNIT/ML SOPN Inject 60 Units into the skin at bedtime.     . triamcinolone cream (KENALOG) 0.1 %     . furosemide (LASIX) 40 MG tablet Take 1 tablet (40 mg total) by mouth 2 (two) times daily. 60 tablet 3  . metolazone (ZAROXOLYN) 2.5 MG tablet Take 1 tablet (2.5 mg total) by mouth daily. 30 tablet 3   No current facility-administered medications for this visit.     Allergies  Allergen Reactions  . Penicillins Hives and Swelling    Has patient had a PCN reaction causing immediate rash, facial/tongue/throat swelling, SOB or lightheadedness with hypotension: yes- face swelling Has patient had a PCN reaction causing severe rash involving mucus membranes or skin necrosis: no Has patient had a PCN reaction that required hospitalization unknown (childhood allergy) Has  patient had a PCN reaction occurring within the last 10 years: no If all of the above answers are "NO", then may proceed with Cephalosporin use.   . Codeine Nausea Only  . Iodine-131 Hives    hives  . Iohexol Hives and Rash    pt developed itching and hives along with nasal congestion; needs 13 hour premeds for future studies, Onset Date: [id number]   Code: HIVES, Desc: pt developed itching and hives along with nasal congestion; needs 13 hour premeds for future studies, Onset Date: [id number]  Code: HIVES, Desc: pt developed itching and hives along with nasal congestion; needs 13 hour premeds for future studies, Onset Date: [id number]   . Lisinopril Cough  .  Sulfa Antibiotics Nausea And Vomiting    Past Medical History:  Diagnosis Date  . Arthritis   . Asthma   . Blind   . Blindness and low vision    left eye glass eye,  legally blind in right eye  . Diabetes mellitus   . Diabetic neuropathy ([place])   . Hyperlipidemia   . Hypertension   . Hypothyroidism   . Spinal stenosis     Social History   Socioeconomic History  . Marital status: Single    Spouse name: Not on file  . Number of children: 2  . Years of education: 29  . Highest education level: Not on file  Occupational History  . Occupation: N/A    Comment: disabled  Social Needs  . Financial resource strain: Not on file  . Food insecurity:    Worry: Not on file    Inability: Not on file  . Transportation needs:    Medical: Not on file    Non-medical: Not on file  Tobacco Use  . Smoking status: Former Smoker    Types: Cigarettes    Last attempt to quit: 05/22/1992    Years since quitting: 26.1  . Smokeless tobacco: Never Used  Substance and Sexual Activity  . Alcohol use: No    Comment: quit 20 yrs ago  . Drug use: No  . Sexual activity: Not on file  Lifestyle  . Physical activity:    Days per week: Not on file    Minutes per session: Not on file  . Stress: Not on file  Relationships  . Social connections:    Talks on phone: Not on file    Gets together: Not on file    Attends religious service: Not on file    Active member of club or organization: Not on file    Attends meetings of clubs or organizations: Not on file    Relationship status: Not on file  . Intimate partner violence:    Fear of current or ex partner: Not on file    Emotionally abused: Not on file    Physically abused: Not on file    Forced sexual activity: Not on file  Other Topics Concern  . Not on file  Social History Narrative   Lives alone   caffeine drinks 2 cups of coffee a day, occasional soda      Family History  Problem Relation Age of Onset  . Diabetes Sister   . Glaucoma  Sister   . Hypertension Sister   . Stroke Brother   . Heart attack Paternal Aunt   . Breast cancer Neg Hx      Review of Systems: General: negative for chills, fever, night sweats or weight changes.  Cardiovascular: negative for chest pain, orthopnea, palpitations,  paroxysmal nocturnal dyspnea  Dermatological: negative for rash Respiratory: negative for cough or wheezing Urologic: negative for hematuria Abdominal: negative for nausea, vomiting, diarrhea, bright red blood per rectum, melena, or hematemesis Neurologic: negative for visual changes, syncope, or dizziness All other systems reviewed and are otherwise negative except as noted above.    Blood pressure 118/68, pulse 82, height 4\' 11"  (1.499 m), weight 215 lb (97.5 kg).  General appearance: alert, cooperative, no distress and morbidly obese Lungs: decreased BS at bases Heart: regular rate and rhythm and soft systolic murmur LSB Extremities: trace edema Skin: warm and dry Neurologic: Grossly normal  EKG [id number]  ASSESSMENT AND PLAN:   Chronic diastolic heart failure ([place]) Echo Nov 2019- EF 60-65%, grade 1DD Her weight is not far off her presumed dry weight. 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  We opted for a trial of Lasix 40 mg BID along with Metolazone 2.5 mg MWF.  I'll check labs today as well-BMP, CBC, BNP.  Not sure if a Rt heart cath would help- will review with MD.  Kerin Ransom PA-C 07/26/2018 10:54 AM

## 2018-07-26 NOTE — Assessment & Plan Note (Signed)
Retinopathy, nephropathy, neuropathy

## 2018-07-26 NOTE — Assessment & Plan Note (Signed)
Significantly impacting her overall health

## 2018-08-13 ENCOUNTER — Ambulatory Visit: Payer: Medicare Other | Admitting: Orthotics

## 2018-08-23 ENCOUNTER — Telehealth (HOSPITAL_COMMUNITY): Payer: Self-pay

## 2018-08-23 ENCOUNTER — Other Ambulatory Visit: Payer: Self-pay

## 2018-08-23 ENCOUNTER — Ambulatory Visit (HOSPITAL_COMMUNITY)
Admission: RE | Admit: 2018-08-23 | Discharge: 2018-08-23 | Disposition: A | Discharge status: Home / Self Care | Payer: Medicare Other | Source: Ambulatory Visit | Attending: Internal Medicine | Admitting: Internal Medicine

## 2018-08-23 ENCOUNTER — Encounter (HOSPITAL_COMMUNITY): Payer: Self-pay

## 2018-08-23 ENCOUNTER — Telehealth (HOSPITAL_COMMUNITY): Payer: Self-pay | Admitting: Licensed Clinical Social Worker

## 2018-08-23 VITALS — Wt 222.0 lb

## 2018-08-23 DIAGNOSIS — G4733 Obstructive sleep apnea (adult) (pediatric): Secondary | ICD-10-CM

## 2018-08-23 DIAGNOSIS — I5032 Chronic diastolic (congestive) heart failure: Secondary | ICD-10-CM | POA: Diagnosis not present

## 2018-08-23 DIAGNOSIS — Z9989 Dependence on other enabling machines and devices: Secondary | ICD-10-CM

## 2018-08-23 NOTE — Patient Instructions (Signed)
Follow up in 4 months with Avanced Practice Physician, labs will be drawn at this time. We will call you to schedule this appointment with you.  You have been referred to the Heart Failure Social workers for meals.

## 2018-08-23 NOTE — Addendum Note (Signed)
Encounter addended by: Jovita Kussmaul, RN on: 08/23/2018 12:23 PM  Actions taken: Clinical Note Signed

## 2018-08-23 NOTE — Telephone Encounter (Signed)
AVS mailed

## 2018-08-23 NOTE — Telephone Encounter (Signed)
CSW received consult that pt is having food insecurity concerns.  CSW spoke with pt who confirms that she has had issues obtaining food during this crisis.  States her friend who normally takes her shopping works at the post office long hours and is not as available to help her at this time.  CSW referred pt to food delivery program that will bring her 7 preprepared meals a week during this time.  CSW explained that meals will be delivered to her apartment with no face to face contact- delivery person will ring the doorbell and leave- pt expressed understanding and does not anticipate issues with this as she is in her living room throughout the day.  Anticipate first delivery will be Monday.  CSW will continue to follow and assist as needed  Jorge Ny, [facility] Desk#: [phone] Cell#: [phone]

## 2018-08-23 NOTE — Progress Notes (Signed)
Heart Failure TeleHealth Note  Due to national recommendations of social distancing due to [place] 19, Audio/video telehealth visit is felt to be most appropriate for this patient at this time.  See MyChart message from today for patient consent regarding telehealth for Amy [name].  Date:  08/23/2018   ID:  Amy [name], DOB 1961-02-23, MRN [id number]  Location: Home  Provider location: [facility] Type of Visit: Established patient  PCP:  Amy Hatchet, MD  Cardiologist:  Amy Burow, MD Primary HF: Dr Amy [name]  Chief Complaint: Heart Failure    History of Present Illness: Amy [name] is a 58 y.o. female with a history of  obesity, HTN, diabetes with legal blindness, HL, OSA and diastolic HF.  Most recent ECHO was 03/2018 EF 55-60% wit Grade 1 DD.   She presents via Psychiatric nurse for a telehealth visit today.  Overall feeling fine. Complaining of allergies. Denies SOB/PND/Orthopnea. Tires to walk in her apartment. Says she takes an extra lasix for leg edema. Appetite ok. Drinking lots of fluids. Tries to limit salt intake. No fever or chills. Using CPAP nightly. Weight at home  222 pounds. Taking all medications. Medications are set up by Amy [name]. She has an aide every day. She has no way to get to the grocery store. She has a friend that can go to store but she has not been able to help her as much because of the virus.    She  denies symptoms worrisome for COVID 19.   Past Medical History:  Diagnosis Date  . Arthritis   . Asthma   . Blind   . Blindness and low vision    left eye glass eye,  legally blind in right eye  . Diabetes mellitus   . Diabetic neuropathy (Amy [name])   . Hyperlipidemia   . Hypertension   . Hypothyroidism   . Spinal stenosis    Past Surgical History:  Procedure Laterality Date  . ABDOMINAL HYSTERECTOMY  1990  . [facility]   x 2  . CHOLECYSTECTOMY  2008  . ENUCLEATION Bilateral 09/15/1998  .  EYE SURGERY  2016   fitting artificial eye     Current Outpatient Medications  Medication Sig Dispense Refill  . albuterol (PROVENTIL HFA;VENTOLIN HFA) 108 (90 BASE) MCG/ACT inhaler Inhale 2 puffs into the lungs every 4 (four) hours as needed for wheezing or shortness of breath. For short    . aspirin EC 81 MG tablet Take 81 mg by mouth daily.    . cetirizine (ZYRTEC) 10 MG tablet TK 1 T PO QD    . esomeprazole (NEXIUM) 40 MG capsule Take 40 mg by mouth daily at 12 noon.     . fluticasone (FLONASE) 50 MCG/ACT nasal spray Place 1 spray into both nostrils daily.     . furosemide (LASIX) 40 MG tablet Take 1 tablet (40 mg total) by mouth 2 (two) times daily. 60 tablet 3  . guaiFENesin (MUCINEX) 600 MG 12 hr tablet Take 1 tablet (600 mg total) by mouth 2 (two) times daily. 60 tablet 0  . HUMALOG KWIKPEN 100 UNIT/ML KiwkPen Inject into the skin 3 (three) times daily. 12-12-17 take 8 units in morning, 10 units at lunch, 14 units at dinner    . levothyroxine (SYNTHROID, LEVOTHROID) 150 MCG tablet Take 150 mcg by mouth daily before breakfast.    . Linaclotide (LINZESS) 145 MCG CAPS capsule Take 145 mcg by mouth daily as  needed (constipation).     . metolazone (ZAROXOLYN) 2.5 MG tablet Take 1 tablet (2.5 mg total) by mouth daily. 30 tablet 3  . potassium chloride SA (K-DUR,KLOR-CON) 20 MEQ tablet TAKE 1 TABLET (20 MEQ TOTAL) BY MOUTH DAILY 65 tablet 2  . pregabalin (LYRICA) 150 MG capsule Take 150 mg by mouth 3 (three) times daily.     . simvastatin (ZOCOR) 20 MG tablet Take 20 mg by mouth daily.    Marland Kitchen spironolactone (ALDACTONE) 25 MG tablet Take 0.5 tablets (12.5 mg total) by mouth daily. 90 tablet 3  . TRESIBA FLEXTOUCH 200 UNIT/ML SOPN Inject 60 Units into the skin at bedtime.     . triamcinolone cream (KENALOG) 0.1 %      No current facility-administered medications for this encounter.     Allergies:   Penicillins; Codeine; Iodine-131; Iohexol; Lisinopril; and Sulfa antibiotics   Social  History:  The patient  reports that she quit smoking about 26 years ago. Her smoking use included cigarettes. She has never used smokeless tobacco. She reports that she does not drink alcohol or use drugs.   Family History:  The patient's family history includes Diabetes in her sister; Glaucoma in her sister; Heart attack in her paternal aunt; Hypertension in her sister; Stroke in her brother.   ROS:  Please see the history of present illness.   All other systems are personally reviewed and negative.   Exam: Tele Health Call; Exam is subjective  General:  Speaks in full sentences. No resp difficulty. Lungs: Normal respiratory effort with conversation.  Abdomen: Non-distended per patient report Extremities: Pt denies edema. Neuro: Alert & oriented x 3.   Recent Labs: 12/11/2017: TSH 0.279 03/07/2018: ALT 26 04/05/2018: B Natriuretic Peptide 48.9 07/26/2018: BUN 55; Creatinine, Ser 1.83; Hemoglobin 12.5; NT-Pro BNP 120; Platelets 169; Potassium 4.2; Sodium 141  Personally reviewed   Wt Readings from Last 3 Encounters:  08/23/18 100.7 kg (222 lb)  07/26/18 97.5 kg (215 lb)  07/09/18 97.3 kg (214 lb 9.6 oz)    ASSESSMENT AND PLAN:  1. Chronic Diastolic Heart Failure , ECHO 03/2018 EF 60-65% Grade IDD.  NYHA III. Volume status has been ok.  Volume status ok. Continue current dose of lasix. She can continue to take an extra lasix as needed for leg edema.  Discussed low salt food choices and limiting fluid to < 2 liter per day.   2. OSA  Continue nightly CPAP   3. Legally Blind  COVID screen The patient does not have any symptoms that suggest any further testing/ screening at this time.  Social distancing reinforced today.  Relevant cardiac medications were reviewed at length with the patient today.   The patient does not have concerns regarding their medications at this time.   The following changes were made today:  None   Recommended follow-up:  Follow up in 4 months.    Today, I have spent 17 minutes with the patient with telehealth technology discussing the above issues .    Amy Hubert, NP  08/23/2018 10:31 AM  [place] [address] [phone] (office) [phone] (fax)

## 2018-08-31 ENCOUNTER — Telehealth (HOSPITAL_COMMUNITY): Payer: Self-pay | Admitting: Licensed Clinical Social Worker

## 2018-08-31 NOTE — Telephone Encounter (Signed)
CSW contacted patient to follow up on weekly food delivery package. Patient informed of delivery time and no face to face contact during delivery. Patient verbalizes understanding and grateful for the assistance.  CSW continues to follow for supportive needs. Jackie Rhiannan Kievit, LCSW, CCSW-MCS [phone]  

## 2018-09-07 ENCOUNTER — Telehealth (HOSPITAL_COMMUNITY): Payer: Self-pay | Admitting: Licensed Clinical Social Worker

## 2018-09-07 NOTE — Telephone Encounter (Signed)
CSW contacted patient to discuss food delivery program delivery on Monday. Patient reports she still has food leftover from prior week and requested to be put on hold. CSW will reach out to patient next week to restart if needed. Patient grateful for support. Amy [name], [place], [place]

## 2018-09-10 ENCOUNTER — Encounter: Payer: Self-pay | Admitting: Cardiovascular Disease

## 2018-09-10 NOTE — Telephone Encounter (Signed)
error 

## 2018-09-11 ENCOUNTER — Ambulatory Visit: Payer: Medicare Other | Admitting: Nurse Practitioner

## 2018-09-17 ENCOUNTER — Encounter

## 2018-09-17 ENCOUNTER — Ambulatory Visit: Payer: Medicare Other | Admitting: Orthotics

## 2018-09-21 ENCOUNTER — Telehealth (HOSPITAL_COMMUNITY): Payer: Self-pay | Admitting: Licensed Clinical Social Worker

## 2018-09-21 NOTE — Telephone Encounter (Signed)
CSW contacted patient to follow up on weekly food delivery package. Patient informed of delivery time and no face to face contact during delivery. Patient verbalizes understanding and grateful for the assistance.  CSW continues to follow for supportive needs. Jackie Eileene Kisling, LCSW, CCSW-MCS [phone]  

## 2018-09-28 ENCOUNTER — Telehealth (HOSPITAL_COMMUNITY): Payer: Self-pay | Admitting: Licensed Clinical Social Worker

## 2018-09-28 NOTE — Telephone Encounter (Signed)
CSW contacted patient to follow up on weekly food delivery package. Patient informed of delivery time and no face to face contact during delivery. Patient verbalizes understanding and grateful for the assistance.  CSW continues to follow for supportive needs. Jackie Ronold [place], LCSW, CCSW-MCS [phone]  

## 2018-10-04 ENCOUNTER — Telehealth (HOSPITAL_COMMUNITY): Payer: Self-pay | Admitting: Licensed Clinical Social Worker

## 2018-10-04 NOTE — Telephone Encounter (Signed)
CSW contacted patient to follow up on weekly food delivery package. Patient informed of change in delivery day due to [facility] Day to Wednesday and no face to face contact during delivery. Patient verbalizes understanding and grateful for the assistance.  CSW continues to follow for supportive needs. Raquel Sarna, [place], [place]

## 2018-10-09 ENCOUNTER — Other Ambulatory Visit: Payer: Self-pay

## 2018-10-09 ENCOUNTER — Ambulatory Visit: Payer: Medicare Other | Admitting: Orthotics

## 2018-10-09 ENCOUNTER — Encounter: Payer: Self-pay | Admitting: Podiatry

## 2018-10-09 ENCOUNTER — Ambulatory Visit (INDEPENDENT_AMBULATORY_CARE_PROVIDER_SITE_OTHER): Payer: Medicare Other | Admitting: Podiatry

## 2018-10-09 DIAGNOSIS — B351 Tinea unguium: Secondary | ICD-10-CM | POA: Diagnosis not present

## 2018-10-09 DIAGNOSIS — E1140 Type 2 diabetes mellitus with diabetic neuropathy, unspecified: Secondary | ICD-10-CM | POA: Diagnosis not present

## 2018-10-09 DIAGNOSIS — L84 Corns and callosities: Secondary | ICD-10-CM | POA: Diagnosis not present

## 2018-10-09 DIAGNOSIS — M79676 Pain in unspecified toe(s): Secondary | ICD-10-CM | POA: Diagnosis not present

## 2018-10-09 DIAGNOSIS — Z794 Long term (current) use of insulin: Secondary | ICD-10-CM

## 2018-10-09 NOTE — Patient Instructions (Signed)

## 2018-10-09 NOTE — Progress Notes (Signed)

## 2018-10-10 ENCOUNTER — Ambulatory Visit: Payer: Medicare Other | Admitting: Podiatry

## 2018-10-11 ENCOUNTER — Ambulatory Visit: Payer: Medicare Other | Admitting: Podiatry

## 2018-10-12 ENCOUNTER — Telehealth (HOSPITAL_COMMUNITY): Payer: Self-pay | Admitting: Licensed Clinical Social Worker

## 2018-10-12 NOTE — Telephone Encounter (Signed)
CSW contacted patient to follow up on weekly food delivery package. Patient informed of no face to face contact during delivery. CSW discussed transition option for food delivery as the Covid 19 Food relief program will be ending on October 26, 2018. Patient verbalizes understanding and grateful for the assistance.  CSW continues to follow for supportive needs. Jackie Karmina Zufall, LCSW, CCSW-MCS [phone] 

## 2018-10-14 NOTE — Progress Notes (Signed)
Subjective:  Amy [name] presents for preventative diabetic foot care.  She has history of diabetic neuropathy and presents to clinic today with cc of  painful, thick, discolored, elongated toenails 1-5 b/l that become tender and cannot cut because of thickness.  Pain is aggravated when wearing enclosed shoe gear.  Her blood sugar this morning was 156 mg/dL per patient recall.  Last A1c was 9.9.  Velna Hatchet, MD is her PCP.   Current Outpatient Medications:  .  albuterol (PROVENTIL HFA;VENTOLIN HFA) 108 (90 BASE) MCG/ACT inhaler, Inhale 2 puffs into the lungs every 4 (four) hours as needed for wheezing or shortness of breath. For short, Disp: , Rfl:  .  aspirin EC 81 MG tablet, Take 81 mg by mouth daily., Disp: , Rfl:  .  B-D UF III MINI PEN NEEDLES 31G X 5 MM MISC, U 1 PEN NEEDLE QID WITH EACH INJECTION, Disp: , Rfl:  .  cetirizine (ZYRTEC) 10 MG tablet, TK 1 T PO QD, Disp: , Rfl:  .  esomeprazole (NEXIUM) 40 MG capsule, Take 40 mg by mouth daily at 12 noon. , Disp: , Rfl:  .  fluticasone (FLONASE) 50 MCG/ACT nasal spray, Place 1 spray into both nostrils daily. , Disp: , Rfl:  .  furosemide (LASIX) 40 MG tablet, Take 1 tablet (40 mg total) by mouth 2 (two) times daily., Disp: 60 tablet, Rfl: 3 .  guaiFENesin (MUCINEX) 600 MG 12 hr tablet, Take 1 tablet (600 mg total) by mouth 2 (two) times daily., Disp: 60 tablet, Rfl: 0 .  HUMALOG KWIKPEN 100 UNIT/ML KiwkPen, Inject into the skin 3 (three) times daily. 12-12-17 take 8 units in morning, 10 units at lunch, 14 units at dinner, Disp: , Rfl:  .  levothyroxine (SYNTHROID, LEVOTHROID) 150 MCG tablet, Take 150 mcg by mouth daily before breakfast., Disp: , Rfl:  .  Linaclotide (LINZESS) 145 MCG CAPS capsule, Take 145 mcg by mouth daily as needed (constipation). , Disp: , Rfl:  .  methocarbamol (ROBAXIN) 500 MG tablet, , Disp: , Rfl:  .  metolazone (ZAROXOLYN) 2.5 MG tablet, Take 1 tablet (2.5 mg total) by mouth daily., Disp: 30 tablet, Rfl:  3 .  potassium chloride SA (K-DUR,KLOR-CON) 20 MEQ tablet, TAKE 1 TABLET (20 MEQ TOTAL) BY MOUTH DAILY, Disp: 65 tablet, Rfl: 2 .  pregabalin (LYRICA) 150 MG capsule, Take 150 mg by mouth 3 (three) times daily. , Disp: , Rfl:  .  simvastatin (ZOCOR) 20 MG tablet, Take 20 mg by mouth daily., Disp: , Rfl:  .  spironolactone (ALDACTONE) 25 MG tablet, Take 0.5 tablets (12.5 mg total) by mouth daily., Disp: 90 tablet, Rfl: 3 .  torsemide (DEMADEX) 20 MG tablet, , Disp: , Rfl:  .  TRESIBA FLEXTOUCH 200 UNIT/ML SOPN, Inject 60 Units into the skin at bedtime. , Disp: , Rfl:  .  triamcinolone cream (KENALOG) 0.1 %, , Disp: , Rfl:    Allergies  Allergen Reactions  . Penicillins Hives and Swelling    Has patient had a PCN reaction causing immediate rash, facial/tongue/throat swelling, SOB or lightheadedness with hypotension: yes- face swelling Has patient had a PCN reaction causing severe rash involving mucus membranes or skin necrosis: no Has patient had a PCN reaction that required hospitalization unknown (childhood allergy) Has patient had a PCN reaction occurring within the last 10 years: no If all of the above answers are "NO", then may proceed with Cephalosporin use.   . Codeine Nausea Only  . Iodine-131 Hives  hives  . Iohexol Hives and Rash    pt developed itching and hives along with nasal congestion; needs 13 hour premeds for future studies, Onset Date: [id number]   Code: HIVES, Desc: pt developed itching and hives along with nasal congestion; needs 13 hour premeds for future studies, Onset Date: [id number]  Code: HIVES, Desc: pt developed itching and hives along with nasal congestion; needs 13 hour premeds for future studies, Onset Date: [id number]   . Lisinopril Cough  . Sulfa Antibiotics Nausea And Vomiting     Objective: There were no vitals filed for this visit.  Physical Examination:  Vascular Examination: Capillary refill time immediate x 10 digits.  Palpable DP/PT pulses  b/l.  Digital hair present b/l.  No edema noted b/l.  Skin temperature gradient WNL b/l.  Dermatological Examination: Skin with normal turgor, texture and tone b/l.  Plantar fibroma noted left arch area.  No erythema, no edema, no drainage, no flocculence noted.  No open wounds b/l.  No interdigital macerations noted b/l.  Elongated, thick, discolored brittle toenails with subungual debris and pain on dorsal palpation of nailbeds 1-5 b/l.  Hyperkeratotic lesion noted plantar medial aspect of her right heel.  There is no erythema, no edema, no drainage, no flocculence noted.  Musculoskeletal Examination: Muscle strength 5/5 to all muscle groups b/l  Hammertoe deformity noted digits 2 through 5 bilaterally.  No pain, crepitus or joint discomfort with active/passive ROM.  Neurological Examination: Sensation diminished l with 10 gram monofilament.  Vibratory sensation diminished b/l.  Assessment: Mycotic nail infection with pain 1-5 b/l Callus plantar aspect right heel NIDDM with neuropathy  Plan: 1. Toenails 1-5 b/l were debrided in length and girth without iatrogenic laceration.  2. Calluses pared right heel utilizing sterile scalpel blade without incident. 3. Continue soft, supportive shoe gear daily. 3.  Report any pedal injuries to medical professional. 4.  Follow up 3 months. 5.  Patient/POA to call should there be a question/concern in there interim.

## 2018-10-18 ENCOUNTER — Telehealth (HOSPITAL_COMMUNITY): Payer: Self-pay | Admitting: Licensed Clinical Social Worker

## 2018-10-18 NOTE — Telephone Encounter (Signed)
CSW contacted patient to follow up on weekly food delivery package. Patient informed of no face to face contact during delivery. CSW discussed transition option for food delivery as the Covid 19 Food relief program will be ending on October 22, 2018. Patient verbalizes understanding and grateful for the assistance.  CSW continues to follow for supportive needs. Raquel Sarna, [place], [place]

## 2018-10-24 ENCOUNTER — Telehealth (HOSPITAL_COMMUNITY): Payer: Self-pay | Admitting: Licensed Clinical Social Worker

## 2018-10-24 NOTE — Telephone Encounter (Signed)
CSW received message to contact patient who had questions regarding food program. CSW contacted patient who states she would like to continue on the [employer] program but has not yet mailed the application. CSW encouraged patient to put in the mail asap and will get her signed up. Patient grateful for the support. Raquel Sarna, [place], [place]

## 2018-10-30 ENCOUNTER — Observation Stay (HOSPITAL_COMMUNITY): Payer: Medicare Other

## 2018-10-30 ENCOUNTER — Emergency Department (HOSPITAL_COMMUNITY): Payer: Medicare Other

## 2018-10-30 ENCOUNTER — Inpatient Hospital Stay (HOSPITAL_COMMUNITY)
Admission: EM | Admit: 2018-10-30 | Discharge: 2018-11-05 | DRG: 872 | Disposition: A | Discharge status: Home Health | Payer: Medicare Other | Attending: Internal Medicine | Admitting: Internal Medicine

## 2018-10-30 ENCOUNTER — Encounter (HOSPITAL_COMMUNITY): Payer: Self-pay | Admitting: Family Medicine

## 2018-10-30 DIAGNOSIS — Z6841 Body Mass Index (BMI) 40.0 and over, adult: Secondary | ICD-10-CM

## 2018-10-30 DIAGNOSIS — Z880 Allergy status to penicillin: Secondary | ICD-10-CM

## 2018-10-30 DIAGNOSIS — M1990 Unspecified osteoarthritis, unspecified site: Secondary | ICD-10-CM | POA: Diagnosis present

## 2018-10-30 DIAGNOSIS — Z885 Allergy status to narcotic agent status: Secondary | ICD-10-CM

## 2018-10-30 DIAGNOSIS — Z7951 Long term (current) use of inhaled steroids: Secondary | ICD-10-CM

## 2018-10-30 DIAGNOSIS — Z79890 Hormone replacement therapy (postmenopausal): Secondary | ICD-10-CM

## 2018-10-30 DIAGNOSIS — N183 Chronic kidney disease, stage 3 unspecified: Secondary | ICD-10-CM | POA: Diagnosis present

## 2018-10-30 DIAGNOSIS — Z833 Family history of diabetes mellitus: Secondary | ICD-10-CM

## 2018-10-30 DIAGNOSIS — H548 Legal blindness, as defined in USA: Secondary | ICD-10-CM | POA: Diagnosis present

## 2018-10-30 DIAGNOSIS — N184 Chronic kidney disease, stage 4 (severe): Secondary | ICD-10-CM | POA: Diagnosis present

## 2018-10-30 DIAGNOSIS — I5032 Chronic diastolic (congestive) heart failure: Secondary | ICD-10-CM | POA: Diagnosis present

## 2018-10-30 DIAGNOSIS — Z882 Allergy status to sulfonamides status: Secondary | ICD-10-CM

## 2018-10-30 DIAGNOSIS — R531 Weakness: Secondary | ICD-10-CM | POA: Diagnosis not present

## 2018-10-30 DIAGNOSIS — Z7982 Long term (current) use of aspirin: Secondary | ICD-10-CM

## 2018-10-30 DIAGNOSIS — Z83511 Family history of glaucoma: Secondary | ICD-10-CM

## 2018-10-30 DIAGNOSIS — A419 Sepsis, unspecified organism: Secondary | ICD-10-CM

## 2018-10-30 DIAGNOSIS — Z888 Allergy status to other drugs, medicaments and biological substances status: Secondary | ICD-10-CM

## 2018-10-30 DIAGNOSIS — I130 Hypertensive heart and chronic kidney disease with heart failure and stage 1 through stage 4 chronic kidney disease, or unspecified chronic kidney disease: Secondary | ICD-10-CM | POA: Diagnosis present

## 2018-10-30 DIAGNOSIS — R6520 Severe sepsis without septic shock: Secondary | ICD-10-CM | POA: Diagnosis present

## 2018-10-30 DIAGNOSIS — E039 Hypothyroidism, unspecified: Secondary | ICD-10-CM | POA: Diagnosis present

## 2018-10-30 DIAGNOSIS — Z20828 Contact with and (suspected) exposure to other viral communicable diseases: Secondary | ICD-10-CM | POA: Diagnosis present

## 2018-10-30 DIAGNOSIS — M4800 Spinal stenosis, site unspecified: Secondary | ICD-10-CM | POA: Diagnosis present

## 2018-10-30 DIAGNOSIS — E1165 Type 2 diabetes mellitus with hyperglycemia: Secondary | ICD-10-CM | POA: Diagnosis not present

## 2018-10-30 DIAGNOSIS — I10 Essential (primary) hypertension: Secondary | ICD-10-CM | POA: Diagnosis present

## 2018-10-30 DIAGNOSIS — M71121 Other infective bursitis, right elbow: Secondary | ICD-10-CM | POA: Diagnosis not present

## 2018-10-30 DIAGNOSIS — E118 Type 2 diabetes mellitus with unspecified complications: Secondary | ICD-10-CM

## 2018-10-30 DIAGNOSIS — Z823 Family history of stroke: Secondary | ICD-10-CM

## 2018-10-30 DIAGNOSIS — J45909 Unspecified asthma, uncomplicated: Secondary | ICD-10-CM | POA: Diagnosis present

## 2018-10-30 DIAGNOSIS — D696 Thrombocytopenia, unspecified: Secondary | ICD-10-CM | POA: Diagnosis present

## 2018-10-30 DIAGNOSIS — E1140 Type 2 diabetes mellitus with diabetic neuropathy, unspecified: Secondary | ICD-10-CM | POA: Diagnosis present

## 2018-10-30 DIAGNOSIS — Z8249 Family history of ischemic heart disease and other diseases of the circulatory system: Secondary | ICD-10-CM

## 2018-10-30 DIAGNOSIS — E1122 Type 2 diabetes mellitus with diabetic chronic kidney disease: Secondary | ICD-10-CM | POA: Diagnosis present

## 2018-10-30 DIAGNOSIS — G4733 Obstructive sleep apnea (adult) (pediatric): Secondary | ICD-10-CM

## 2018-10-30 DIAGNOSIS — E785 Hyperlipidemia, unspecified: Secondary | ICD-10-CM | POA: Diagnosis present

## 2018-10-30 DIAGNOSIS — Z87891 Personal history of nicotine dependence: Secondary | ICD-10-CM

## 2018-10-30 DIAGNOSIS — N179 Acute kidney failure, unspecified: Secondary | ICD-10-CM | POA: Diagnosis present

## 2018-10-30 DIAGNOSIS — Z794 Long term (current) use of insulin: Secondary | ICD-10-CM

## 2018-10-30 LAB — CBC
HCT: 37.8 % (ref 36.0–46.0)
Hemoglobin: 11.7 g/dL — ABNORMAL LOW (ref 12.0–15.0)
MCH: 25.1 pg — ABNORMAL LOW (ref 26.0–34.0)
MCHC: 31 g/dL (ref 30.0–36.0)
MCV: 80.9 fL (ref 80.0–100.0)
Platelets: 148 10*3/uL — ABNORMAL LOW (ref 150–400)
RBC: 4.67 MIL/uL (ref 3.87–5.11)
RDW: 15.9 % — ABNORMAL HIGH (ref 11.5–15.5)
WBC: 16.9 10*3/uL — ABNORMAL HIGH (ref 4.0–10.5)
nRBC: 0 % (ref 0.0–0.2)

## 2018-10-30 LAB — SYNOVIAL CELL COUNT + DIFF, W/ CRYSTALS
Eosinophils-Synovial: 0 % (ref 0–1)
Lymphocytes-Synovial Fld: 0 % (ref 0–20)
Monocyte-Macrophage-Synovial Fluid: 4 % — ABNORMAL LOW (ref 50–90)
Neutrophil, Synovial: 96 % — ABNORMAL HIGH (ref 0–25)
WBC, Synovial: 46000 /mm3 — ABNORMAL HIGH (ref 0–200)

## 2018-10-30 LAB — GLUCOSE, CAPILLARY
Glucose-Capillary: 129 mg/dL — ABNORMAL HIGH (ref 70–99)
Glucose-Capillary: 136 mg/dL — ABNORMAL HIGH (ref 70–99)

## 2018-10-30 LAB — BASIC METABOLIC PANEL
Anion gap: 13 (ref 5–15)
BUN: 67 mg/dL — ABNORMAL HIGH (ref 6–20)
CO2: 29 mmol/L (ref 22–32)
Calcium: 9.2 mg/dL (ref 8.9–10.3)
Chloride: 102 mmol/L (ref 98–111)
Creatinine, Ser: 2.24 mg/dL — ABNORMAL HIGH (ref 0.44–1.00)
GFR calc Af Amer: 27 mL/min — ABNORMAL LOW (ref >60)
GFR calc non Af Amer: 23 mL/min — ABNORMAL LOW (ref >60)
Glucose, Bld: 128 mg/dL — ABNORMAL HIGH (ref 70–99)
Potassium: 3.7 mmol/L (ref 3.5–5.1)
Sodium: 144 mmol/L (ref 135–145)

## 2018-10-30 LAB — HEPATIC FUNCTION PANEL
ALT: 30 U/L (ref 0–44)
AST: 28 U/L (ref 15–41)
Albumin: 3.2 g/dL — ABNORMAL LOW (ref 3.5–5.0)
Alkaline Phosphatase: 112 U/L (ref 38–126)
Bilirubin, Direct: 0.1 mg/dL (ref 0.0–0.2)
Indirect Bilirubin: 0.3 mg/dL (ref 0.3–0.9)
Total Bilirubin: 0.4 mg/dL (ref 0.3–1.2)
Total Protein: 7.3 g/dL (ref 6.5–8.1)

## 2018-10-30 LAB — URINALYSIS, ROUTINE W REFLEX MICROSCOPIC
Bilirubin Urine: NEGATIVE
Glucose, UA: NEGATIVE mg/dL
Hgb urine dipstick: NEGATIVE
Ketones, ur: NEGATIVE mg/dL
Leukocytes,Ua: NEGATIVE
Nitrite: NEGATIVE
Protein, ur: NEGATIVE mg/dL
Specific Gravity, Urine: 1.01 (ref 1.005–1.030)
pH: 5 (ref 5.0–8.0)

## 2018-10-30 LAB — LACTIC ACID, PLASMA: Lactic Acid, Venous: 1.3 mmol/L (ref 0.5–1.9)

## 2018-10-30 LAB — CBG MONITORING, ED: Glucose-Capillary: 60 mg/dL — ABNORMAL LOW (ref 70–99)

## 2018-10-30 LAB — SARS CORONAVIRUS 2 BY RT PCR (HOSPITAL ORDER, PERFORMED IN ~~LOC~~ HOSPITAL LAB): SARS Coronavirus 2: NEGATIVE

## 2018-10-30 MED ORDER — VANCOMYCIN HCL IN DEXTROSE 1-5 GM/200ML-% IV SOLN
1000.0000 mg | Freq: Once | INTRAVENOUS | Status: AC
  Administered 2018-10-30: 1000 mg via INTRAVENOUS
  Filled 2018-10-30: qty 200

## 2018-10-30 MED ORDER — ALBUTEROL SULFATE (2.5 MG/3ML) 0.083% IN NEBU: 3.0000 mL | INHALATION_SOLUTION | RESPIRATORY_TRACT | Status: DC | PRN

## 2018-10-30 MED ORDER — SODIUM CHLORIDE 0.9% FLUSH
3.0000 mL | Freq: Two times a day (BID) | INTRAVENOUS | Status: DC
  Administered 2018-10-31 – 2018-11-02 (×5): 3 mL via INTRAVENOUS

## 2018-10-30 MED ORDER — ACETAMINOPHEN 325 MG PO TABS
650.0000 mg | ORAL_TABLET | Freq: Four times a day (QID) | ORAL | Status: DC | PRN
  Administered 2018-10-31 – 2018-11-03 (×3): 650 mg via ORAL
  Filled 2018-10-30 (×3): qty 2

## 2018-10-30 MED ORDER — ONDANSETRON HCL 4 MG/2ML IJ SOLN
4.0000 mg | Freq: Four times a day (QID) | INTRAMUSCULAR | Status: DC | PRN
  Administered 2018-11-02: 4 mg via INTRAVENOUS
  Filled 2018-10-30: qty 2

## 2018-10-30 MED ORDER — INSULIN ASPART 100 UNIT/ML ~~LOC~~ SOLN
0.0000 [IU] | SUBCUTANEOUS | Status: DC
  Administered 2018-10-30 – 2018-10-31 (×2): 1 [IU] via SUBCUTANEOUS
  Administered 2018-10-31: 20:00:00 3 [IU] via SUBCUTANEOUS
  Administered 2018-10-31 – 2018-11-01 (×4): 2 [IU] via SUBCUTANEOUS
  Administered 2018-11-01: 3 [IU] via SUBCUTANEOUS
  Administered 2018-11-01: 2 [IU] via SUBCUTANEOUS
  Administered 2018-11-01: 08:00:00 1 [IU] via SUBCUTANEOUS
  Administered 2018-11-02 (×3): 3 [IU] via SUBCUTANEOUS
  Administered 2018-11-02: 5 [IU] via SUBCUTANEOUS
  Administered 2018-11-02: 3 [IU] via SUBCUTANEOUS
  Administered 2018-11-02: 2 [IU] via SUBCUTANEOUS
  Administered 2018-11-03: 1 [IU] via SUBCUTANEOUS
  Administered 2018-11-03 (×2): 2 [IU] via SUBCUTANEOUS
  Administered 2018-11-03: 16:00:00 3 [IU] via SUBCUTANEOUS
  Administered 2018-11-03 (×2): 2 [IU] via SUBCUTANEOUS
  Administered 2018-11-04: 3 [IU] via SUBCUTANEOUS
  Administered 2018-11-04: 16:00:00 5 [IU] via SUBCUTANEOUS
  Administered 2018-11-04 (×2): 2 [IU] via SUBCUTANEOUS
  Administered 2018-11-04: 21:00:00 9 [IU] via SUBCUTANEOUS
  Administered 2018-11-04: 05:00:00 2 [IU] via SUBCUTANEOUS
  Administered 2018-11-05: 3 [IU] via SUBCUTANEOUS
  Administered 2018-11-05: 01:00:00 7 [IU] via SUBCUTANEOUS
  Administered 2018-11-05: 04:00:00 5 [IU] via SUBCUTANEOUS

## 2018-10-30 MED ORDER — ONDANSETRON HCL 4 MG PO TABS: 4.0000 mg | ORAL_TABLET | Freq: Four times a day (QID) | ORAL | Status: DC | PRN

## 2018-10-30 MED ORDER — SODIUM CHLORIDE 0.9 % IV SOLN
1000.0000 mL | INTRAVENOUS | Status: DC
  Administered 2018-10-30: 1000 mL via INTRAVENOUS

## 2018-10-30 MED ORDER — SODIUM CHLORIDE 0.9% FLUSH: 3.0000 mL | INTRAVENOUS | Status: DC | PRN

## 2018-10-30 MED ORDER — VANCOMYCIN HCL 1000 MG IV SOLR
1000.0000 mg | Freq: Once | INTRAVENOUS | Status: AC
  Administered 2018-10-30: 1000 mg via INTRAVENOUS
  Filled 2018-10-30: qty 1000

## 2018-10-30 MED ORDER — VANCOMYCIN HCL 10 G IV SOLR
1000.0000 mg | Freq: Once | INTRAVENOUS | Status: DC
  Filled 2018-10-30: qty 1000

## 2018-10-30 MED ORDER — LEVOTHYROXINE SODIUM 75 MCG PO TABS
150.0000 ug | ORAL_TABLET | Freq: Every day | ORAL | Status: DC
  Administered 2018-10-31 – 2018-11-05 (×6): 150 ug via ORAL
  Filled 2018-10-30 (×6): qty 2

## 2018-10-30 MED ORDER — SIMVASTATIN 20 MG PO TABS
20.0000 mg | ORAL_TABLET | Freq: Every day | ORAL | Status: DC
  Administered 2018-10-31 – 2018-11-04 (×5): 20 mg via ORAL
  Filled 2018-10-30 (×5): qty 1

## 2018-10-30 MED ORDER — HYDROCODONE-ACETAMINOPHEN 5-325 MG PO TABS
1.0000 | ORAL_TABLET | ORAL | Status: DC | PRN
  Administered 2018-11-01: 2 via ORAL
  Administered 2018-11-01 – 2018-11-04 (×2): 1 via ORAL
  Filled 2018-10-30: qty 2
  Filled 2018-10-30 (×2): qty 1

## 2018-10-30 MED ORDER — INSULIN GLARGINE 100 UNIT/ML ~~LOC~~ SOLN
30.0000 [IU] | Freq: Every day | SUBCUTANEOUS | Status: DC
  Administered 2018-10-31: 30 [IU] via SUBCUTANEOUS
  Filled 2018-10-30 (×2): qty 0.3

## 2018-10-30 MED ORDER — VANCOMYCIN HCL IN DEXTROSE 1-5 GM/200ML-% IV SOLN
1000.0000 mg | Freq: Once | INTRAVENOUS | Status: DC
  Filled 2018-10-30: qty 200

## 2018-10-30 MED ORDER — PREGABALIN 50 MG PO CAPS
75.0000 mg | ORAL_CAPSULE | Freq: Two times a day (BID) | ORAL | Status: DC
  Administered 2018-10-31 – 2018-11-03 (×7): 75 mg via ORAL
  Filled 2018-10-30 (×7): qty 1

## 2018-10-30 MED ORDER — ACETAMINOPHEN 650 MG RE SUPP: 650.0000 mg | Freq: Four times a day (QID) | RECTAL | Status: DC | PRN

## 2018-10-30 MED ORDER — PANTOPRAZOLE SODIUM 40 MG PO TBEC
40.0000 mg | DELAYED_RELEASE_TABLET | Freq: Every day | ORAL | Status: DC
  Administered 2018-10-31 – 2018-11-05 (×6): 40 mg via ORAL
  Filled 2018-10-30 (×6): qty 1

## 2018-10-30 MED ORDER — PREGABALIN 50 MG PO CAPS: 150.0000 mg | ORAL_CAPSULE | Freq: Three times a day (TID) | ORAL | Status: DC

## 2018-10-30 MED ORDER — SENNOSIDES-DOCUSATE SODIUM 8.6-50 MG PO TABS: 1.0000 | ORAL_TABLET | Freq: Every evening | ORAL | Status: DC | PRN

## 2018-10-30 MED ORDER — VANCOMYCIN HCL IN DEXTROSE 1-5 GM/200ML-% IV SOLN: 1000.0000 mg | Freq: Once | INTRAVENOUS | Status: DC

## 2018-10-30 MED ORDER — SODIUM CHLORIDE 0.9 % IV SOLN: 250.0000 mL | INTRAVENOUS | Status: DC | PRN

## 2018-10-30 NOTE — H&P (Signed)
ORTHOPAEDIC CONSULTATION  REQUESTING PHYSICIAN: Donnie Coffin, MD  PCP:  Velna Hatchet, MD  Chief Complaint: Right elbow pain  HPI: Amy [name] is a 58 y.o. female who complains of right elbow pain.  Over the past 3 days she is noticed approximately 3-day history of fevers, decreased appetite as well as general malaise.  She also then started developing right elbow pain.  She noticed increased discomfort with direct pressure on the right elbow.  She was seen by her PCP who over ordered a COVID test and prescribed oral antibiotics.  She states after taking 1 dose of oral antibiotics she is noticed no significant difference.  This prompted her to proceed to the ER.  Within the ER she was found to have swelling as well as pain and erythema around the posterior aspect of the elbow.  This area was aspirated and a small amount of cloudy fluid was removed.  Since then she has had continued pain and was subsequently admitted to the hospital service for further treatment options.  Hand surgery was consulted for further recommendations as well.  Past Medical History:  Diagnosis Date  . Arthritis   . Asthma   . Blind   . Blindness and low vision    left eye glass eye,  legally blind in right eye  . Diabetes mellitus   . Diabetic neuropathy ([place])   . Hyperlipidemia   . Hypertension   . Hypothyroidism   . Spinal stenosis    Past Surgical History:  Procedure Laterality Date  . ABDOMINAL HYSTERECTOMY  1990  . [place]   x 2  . CHOLECYSTECTOMY  2008  . ENUCLEATION Bilateral 09/15/1998  . EYE SURGERY  2016   fitting artificial eye   Social History   Socioeconomic History  . Marital status: Single    Spouse name: Not on file  . Number of children: 2  . Years of education: 62  . Highest education level: Not on file  Occupational History  . Occupation: N/A    Comment: disabled  Social Needs  . Financial resource strain: Not on file  . Food insecurity     Worry: Not on file    Inability: Not on file  . Transportation needs    Medical: Not on file    Non-medical: Not on file  Tobacco Use  . Smoking status: Former Smoker    Types: Cigarettes    Quit date: 05/22/1992    Years since quitting: 26.4  . Smokeless tobacco: Never Used  Substance and Sexual Activity  . Alcohol use: No    Comment: quit 20 yrs ago  . Drug use: No  . Sexual activity: Not on file  Lifestyle  . Physical activity    Days per week: Not on file    Minutes per session: Not on file  . Stress: Not on file  Relationships  . Social Herbalist on phone: Not on file    Gets together: Not on file    Attends religious service: Not on file    Active member of club or organization: Not on file    Attends meetings of clubs or organizations: Not on file    Relationship status: Not on file  Other Topics Concern  . Not on file  Social History Narrative   Lives alone   caffeine drinks 2 cups of coffee a day, occasional soda    Family History  Problem Relation Age of Onset  .  Diabetes Sister   . Glaucoma Sister   . Hypertension Sister   . Stroke Brother   . Heart attack Paternal Aunt   . Breast cancer Neg Hx    Allergies  Allergen Reactions  . Penicillins Hives and Swelling    Has patient had a PCN reaction causing immediate rash, facial/tongue/throat swelling, SOB or lightheadedness with hypotension: yes- face swelling Has patient had a PCN reaction causing severe rash involving mucus membranes or skin necrosis: no Has patient had a PCN reaction that required hospitalization unknown (childhood allergy) Has patient had a PCN reaction occurring within the last 10 years: no If all of the above answers are "NO", then may proceed with Cephalosporin use.   . Codeine Nausea Only  . Iodine-131 Hives  . Iohexol Hives    Pt developed itching and hives along with nasal congestion; needs 13 hour premeds for future studies, Onset Date: [id number]   . Lisinopril Cough   . Sulfa Antibiotics Nausea And Vomiting   Prior to Admission medications   Medication Sig Start Date End Date Taking? Authorizing Provider  albuterol (PROVENTIL HFA;VENTOLIN HFA) 108 (90 BASE) MCG/ACT inhaler Inhale 2 puffs into the lungs every 4 (four) hours as needed for wheezing or shortness of breath. For short   Yes [provider]  aspirin EC 81 MG tablet Take 81 mg by mouth daily.   Yes [provider]  cetirizine (ZYRTEC) 10 MG tablet Take 10 mg by mouth at bedtime.  05/31/18  Yes [provider]  esomeprazole (NEXIUM) 40 MG capsule Take 40 mg by mouth daily.    Yes [provider]  fluticasone (FLONASE) 50 MCG/ACT nasal spray Place 1 spray into both nostrils daily.    Yes [provider]  guaiFENesin (MUCINEX) 600 MG 12 hr tablet Take 1 tablet (600 mg total) by mouth 2 (two) times daily. 07/09/18  Yes Lendon Colonel, NP  HUMALOG KWIKPEN 100 UNIT/ML KiwkPen Inject 8-14 Units into the skin See admin instructions. Inject 8 units in morning, 10 units at lunch, and 14 units at dinner 01/24/17  Yes [provider]  levothyroxine (SYNTHROID, LEVOTHROID) 150 MCG tablet Take 150 mcg by mouth daily before breakfast.   Yes [provider]  Linaclotide (LINZESS) 145 MCG CAPS capsule Take 145 mcg by mouth daily as needed (constipation).    Yes [provider]  methocarbamol (ROBAXIN) 500 MG tablet Take 500 mg by mouth every 8 (eight) hours as needed for muscle spasms.  08/14/18  Yes [provider]  metolazone (ZAROXOLYN) 2.5 MG tablet Take 1 tablet (2.5 mg total) by mouth daily. 07/26/18  Yes Kilroy, Luke K, PA-C  potassium chloride SA (K-DUR,KLOR-CON) 20 MEQ tablet TAKE 1 TABLET (20 MEQ TOTAL) BY MOUTH DAILY Patient taking differently: Take 20 mEq by mouth daily.  07/04/18  Yes Bensimhon, Shaune Pascal, MD  pregabalin (LYRICA) 150 MG capsule Take 150 mg by mouth 3 (three) times daily.  01/01/18  Yes [provider]   simvastatin (ZOCOR) 20 MG tablet Take 20 mg by mouth daily.   Yes [provider]  spironolactone (ALDACTONE) 25 MG tablet Take 0.5 tablets (12.5 mg total) by mouth daily. 10/30/17  Yes Shirley Friar, PA-C  torsemide (DEMADEX) 20 MG tablet Take 20-30 mg by mouth See admin instructions. Take 30 mg by mouth in the morning and 20 mg in the evening 10/04/18  Yes [provider]  TRESIBA FLEXTOUCH 200 UNIT/ML SOPN Inject 60 Units into the skin at  bedtime.  05/30/18  Yes [provider]  triamcinolone cream (KENALOG) 0.1 % Apply 1 application topically daily as needed (irritation).  05/30/18  Yes [provider]  B-D UF III MINI PEN NEEDLES 31G X 5 MM MISC U 1 PEN NEEDLE QID WITH EACH INJECTION 08/01/18   [provider]  furosemide (LASIX) 40 MG tablet Take 1 tablet (40 mg total) by mouth 2 (two) times daily. Patient not taking: Reported on 10/30/2018 07/26/18   Erlene Quan, PA-C   Dg Elbow Complete Right  Result Date: 10/30/2018 CLINICAL DATA:  58 y/o  F; right elbow pain.  Redness and swelling. EXAM: RIGHT ELBOW - COMPLETE 3+ VIEW COMPARISON:  None. FINDINGS: There is no evidence of fracture, dislocation, or joint effusion. There is no evidence of arthropathy or other focal bone abnormality. IMPRESSION: No acute fracture or dislocation identified. Electronically Signed   By: Kristine Garbe M.D.   On: 10/30/2018 20:45   Dg Chest Port 1 View  Result Date: 10/30/2018 CLINICAL DATA:  Cough, fever, shortness of breath. EXAM: PORTABLE CHEST 1 VIEW COMPARISON:  03/06/2018 FINDINGS: Mild enlargement of the cardiopericardial silhouette, with indistinctness of the pulmonary vasculature but no definite interstitial edema. Thoracic spondylosis is present. No appreciable airspace opacity. No blunting of the costophrenic angles. IMPRESSION: 1. Mild enlargement of the cardiopericardial silhouette potentially with pulmonary venous hypertension, but no overt  edema. Electronically Signed   By: Van Clines M.D.   On: 10/30/2018 18:06    Positive ROS: All other systems have been reviewed and were otherwise negative with the exception of those mentioned in the HPI and as above.  Physical Exam: General: Alert, no acute distress Cardiovascular: No pedal edema Respiratory: No cyanosis, no use of accessory musculature Skin: No lesions in the area of chief complaint Neurologic: Sensation intact distally Psychiatric: Patient is competent for consent with normal mood and affect Lymphatic: No axillary or cervical lymphadenopathy  MUSCULOSKELETAL: Examination of the right upper extremity shows mild swelling to the posterior aspect of the elbow.  There is some subtle erythema to the area as well.  She has tenderness posteriorly about the elbow which is most significant over the tip of the olecranon.  She has an area that is soft, fluctuant and tender over the tip of the olecranon consistent with bursitis.  She has near full range of motion of the elbow but has pain at the extremes of flexion posteriorly.  She is no tenderness anteriorly in the elbow.  Her fingertips are all warm and well-perfused with brisk capillary refill.  Her sensation is intact light touch throughout all digits.  Assessment: Right elbow septic olecranon bursitis  Plan: I had a long discussion the patient today regarding her right elbow.  Her radiographs were reviewed which showed no fractures, dislocations or changes to the tip of the olecranon.  I do recommend continued nonoperative management of her septic olecranon bursitis with antibiotics.  Most cases of this can be treated nonoperatively with antibiotics, either oral or IV.  She is currently on IV antibiotics we will continue to monitor her response to this.  Appreciate hospitalist management of this patient we will continue to follow along for her clinical improvement.  All of her questions were answered and she was happy with  this plan    Verner Mould, MD Cell [phone]   10/30/2018 11:00 PM

## 2018-10-30 NOTE — ED Triage Notes (Signed)
Per EMS: Pt is coming from home. Pt is reportedly legally blind. Pt denies fever, SOB, Chest pain. Pt has reported generalized weakness 110/52 BP HR 74 SPO2 94% on RA with a non-productive cough RR 16

## 2018-10-30 NOTE — ED Notes (Signed)
Pt reports to this RN she has been running a fever at home and was seen by her primary care doctor yesterday and swabbed for covid and prescribed antibiotics for a URI. Pt reports she has only taken one dose of the antibiotics. Pt reports she has been feeling bad at home and her family pushed for her to "come be admitted to the hospital" because she "can't stay by herself"

## 2018-10-30 NOTE — H&P (Signed)
History and Physical    Amy [name] ZOX:[id number] DOB: 1960-10-09 DOA: 10/30/2018  PCP: Velna Hatchet, MD   Patient coming from: Home   Chief Complaint: Generalized weakness, fever   HPI: Amy [name] is a 58 y.o. female with medical history significant for insulin-dependent diabetes mellitus, chronic kidney disease stage III, blindness, chronic diastolic CHF, and OSA on CPAP, now presenting to emergency department for evaluation of generalized weakness and fever.  Patient reports that she developed a general malaise a few days ago, worsening over the past couple days and accompanied by fever.  She had had a mild nonproductive cough, but denies any shortness of breath.  She denies abdominal pain, vomiting, diarrhea, or dysuria.  She has had some pain at the right elbow, developing insidiously.  Patient suspects that she irritated the right elbow by putting weight on it when she shifts in bed or gets up from a chair.  She was seen in an outpatient clinic yesterday for these complaints, was suspected to have a respiratory illness given her nonproductive cough, and was placed on an antibiotic of which she has taken 1 dose.  ED Course: Upon arrival to the ED, patient is found to be afebrile, saturating well on room air, and with remaining vitals also normal.  EKG features a sinus rhythm and chest x-ray is notable for a mildly enlarged cardiopericardial silhouette with vascular congestion but no overt edema.  Chemistry panel features a creatinine of 2.24, up from 1.8 and March.  CBC features a leukocytosis to 16,900 and a mild thrombocytopenia.  Synovial fluid was collected from the right elbow with no organisms noted on Gram stain but turbid with 46,000 WBC, predominantly PMN.  Hand surgery was consulted by ED physician who recommended medical admission and antibiotics.  Review of Systems:  All other systems reviewed and apart from HPI, are negative.  Past Medical History:  Diagnosis Date  .  Arthritis   . Asthma   . Blind   . Blindness and low vision    left eye glass eye,  legally blind in right eye  . Diabetes mellitus   . Diabetic neuropathy ([place])   . Hyperlipidemia   . Hypertension   . Hypothyroidism   . Spinal stenosis     Past Surgical History:  Procedure Laterality Date  . ABDOMINAL HYSTERECTOMY  1990  . [place]   x 2  . CHOLECYSTECTOMY  2008  . ENUCLEATION Bilateral 09/15/1998  . EYE SURGERY  2016   fitting artificial eye     reports that she quit smoking about 26 years ago. Her smoking use included cigarettes. She has never used smokeless tobacco. She reports that she does not drink alcohol or use drugs.  Allergies  Allergen Reactions  . Penicillins Hives and Swelling    Has patient had a PCN reaction causing immediate rash, facial/tongue/throat swelling, SOB or lightheadedness with hypotension: yes- face swelling Has patient had a PCN reaction causing severe rash involving mucus membranes or skin necrosis: no Has patient had a PCN reaction that required hospitalization unknown (childhood allergy) Has patient had a PCN reaction occurring within the last 10 years: no If all of the above answers are "NO", then may proceed with Cephalosporin use.   . Codeine Nausea Only  . Iodine-131 Hives    hives  . Iohexol Hives and Rash    pt developed itching and hives along with nasal congestion; needs 13 hour premeds for future studies, Onset Date: [id number]  Code: HIVES, Desc: pt developed itching and hives along with nasal congestion; needs 13 hour premeds for future studies, Onset Date: [id number]  Code: HIVES, Desc: pt developed itching and hives along with nasal congestion; needs 13 hour premeds for future studies, Onset Date: [id number]   . Lisinopril Cough  . Sulfa Antibiotics Nausea And Vomiting    Family History  Problem Relation Age of Onset  . Diabetes Sister   . Glaucoma Sister   . Hypertension Sister   . Stroke Brother   .  Heart attack Paternal Aunt   . Breast cancer Neg Hx      Prior to Admission medications   Medication Sig Start Date End Date Taking? Authorizing Provider  albuterol (PROVENTIL HFA;VENTOLIN HFA) 108 (90 BASE) MCG/ACT inhaler Inhale 2 puffs into the lungs every 4 (four) hours as needed for wheezing or shortness of breath. For short    [provider]  aspirin EC 81 MG tablet Take 81 mg by mouth daily.    [provider]  B-D UF III MINI PEN NEEDLES 31G X 5 MM MISC U 1 PEN NEEDLE QID WITH EACH INJECTION 08/01/18   [provider]  cetirizine (ZYRTEC) 10 MG tablet TK 1 T PO QD 05/31/18   [provider]  esomeprazole (NEXIUM) 40 MG capsule Take 40 mg by mouth daily at 12 noon.     [provider]  fluticasone (FLONASE) 50 MCG/ACT nasal spray Place 1 spray into both nostrils daily.     [provider]  furosemide (LASIX) 40 MG tablet Take 1 tablet (40 mg total) by mouth 2 (two) times daily. 07/26/18   Erlene Quan, PA-C  guaiFENesin (MUCINEX) 600 MG 12 hr tablet Take 1 tablet (600 mg total) by mouth 2 (two) times daily. 07/09/18   Lendon Colonel, NP  HUMALOG KWIKPEN 100 UNIT/ML KiwkPen Inject into the skin 3 (three) times daily. 12-12-17 take 8 units in morning, 10 units at lunch, 14 units at dinner 01/24/17   [provider]  levothyroxine (SYNTHROID, LEVOTHROID) 150 MCG tablet Take 150 mcg by mouth daily before breakfast.    [provider]  Linaclotide (LINZESS) 145 MCG CAPS capsule Take 145 mcg by mouth daily as needed (constipation).     [provider]  methocarbamol (ROBAXIN) 500 MG tablet  08/14/18   [provider]  metolazone (ZAROXOLYN) 2.5 MG tablet Take 1 tablet (2.5 mg total) by mouth daily. 07/26/18   Erlene Quan, PA-C  potassium chloride SA (K-DUR,KLOR-CON) 20 MEQ tablet TAKE 1 TABLET (20 MEQ TOTAL) BY MOUTH DAILY 07/04/18   Bensimhon, Shaune Pascal, MD  pregabalin (LYRICA) 150 MG capsule Take 150 mg  by mouth 3 (three) times daily.  01/01/18   [provider]  simvastatin (ZOCOR) 20 MG tablet Take 20 mg by mouth daily.    [provider]  spironolactone (ALDACTONE) 25 MG tablet Take 0.5 tablets (12.5 mg total) by mouth daily. 10/30/17   Shirley Friar, PA-C  torsemide (DEMADEX) 20 MG tablet  10/04/18   [provider]  TRESIBA FLEXTOUCH 200 UNIT/ML SOPN Inject 60 Units into the skin at bedtime.  05/30/18   [provider]  triamcinolone cream (KENALOG) 0.1 %  05/30/18   [provider]    Physical Exam: Vitals:   10/30/18 1454 10/30/18 1705  BP: 112/63 122/70  Pulse: 70 67  Resp: 17 17  Temp: 98.3 F (36.8 C)   TempSrc: Oral   SpO2: 99% 100%  Constitutional: NAD, calm  Eyes: PERTLA, lids and conjunctivae normal ENMT: Mucous membranes are moist. Posterior pharynx clear of any exudate or lesions.   Neck: normal, supple, no masses, no thyromegaly Respiratory: clear to auscultation bilaterally, no wheezing, no crackles. No accessory muscle use.  Cardiovascular: S1 & S2 heard, regular rate and rhythm. 1+ pretibial pitting edema bilaterally. Abdomen: No distension, no tenderness, soft. Bowel sounds normal.  Musculoskeletal: no clubbing / cyanosis. Faint erythema, heat, edema, and tenderness over right olecranon process.   Skin: no significant rashes, lesions, ulcers. Warm, dry, well-perfused. Neurologic: Blind, CN 2-12 grossly intact otherwise. Strength 5/5 in all 4 limbs.  Psychiatric: Alert and oriented x 3. Pleasant and cooperative.    Labs on Admission: I have personally reviewed following labs and imaging studies  CBC: Recent Labs  Lab 10/30/18 1514  WBC 16.9*  HGB 11.7*  HCT 37.8  MCV 80.9  PLT 619*   Basic Metabolic Panel: Recent Labs  Lab 10/30/18 1514  NA 144  K 3.7  CL 102  CO2 29  GLUCOSE 128*  BUN 67*  CREATININE 2.24*  CALCIUM 9.2   GFR: CrCl cannot be calculated (Unknown ideal weight.). Liver  Function Tests: Recent Labs  Lab 10/30/18 1515  AST 28  ALT 30  ALKPHOS 112  BILITOT 0.4  PROT 7.3  ALBUMIN 3.2*   No results for input(s): LIPASE, AMYLASE in the last 168 hours. No results for input(s): AMMONIA in the last 168 hours. Coagulation Profile: No results for input(s): INR, PROTIME in the last 168 hours. Cardiac Enzymes: No results for input(s): CKTOTAL, CKMB, CKMBINDEX, TROPONINI in the last 168 hours. BNP (last 3 results) Recent Labs    07/26/18 1058  PROBNP 120   HbA1C: No results for input(s): HGBA1C in the last 72 hours. CBG: Recent Labs  Lab 10/30/18 1830  GLUCAP 60*   Lipid Profile: No results for input(s): CHOL, HDL, LDLCALC, TRIG, CHOLHDL, LDLDIRECT in the last 72 hours. Thyroid Function Tests: No results for input(s): TSH, T4TOTAL, FREET4, T3FREE, THYROIDAB in the last 72 hours. Anemia Panel: No results for input(s): VITAMINB12, FOLATE, FERRITIN, TIBC, IRON, RETICCTPCT in the last 72 hours. Urine analysis:    Component Value Date/Time   COLORURINE YELLOW 03/06/2018 [place] 03/06/2018 0344   LABSPEC 1.010 03/06/2018 0344   PHURINE 5.0 03/06/2018 0344   GLUCOSEU NEGATIVE 03/06/2018 0344   HGBUR NEGATIVE 03/06/2018 0344   BILIRUBINUR NEGATIVE 03/06/2018 0344   KETONESUR NEGATIVE 03/06/2018 0344   PROTEINUR NEGATIVE 03/06/2018 0344   UROBILINOGEN 0.2 01/16/2015 1121   NITRITE NEGATIVE 03/06/2018 0344   LEUKOCYTESUR NEGATIVE 03/06/2018 0344   Sepsis Labs: @LABRCNTIP (procalcitonin:4,lacticidven:4) ) Recent Results (from the past 240 hour(s))  SARS Coronavirus 2 (CEPHEID- Performed in [facility] hospital lab), Hosp Order     Status: None   Collection Time: 10/30/18  4:46 PM   Specimen: Nasopharyngeal Swab  Result Value Ref Range Status   SARS Coronavirus 2 NEGATIVE NEGATIVE Final    Comment: (NOTE) If result is NEGATIVE SARS-CoV-2 target nucleic acids are NOT DETECTED. The SARS-CoV-2 RNA is generally detectable in upper  and lower  respiratory specimens during the acute phase of infection. The lowest  concentration of SARS-CoV-2 viral copies this assay can detect is 250  copies / mL. A negative result does not preclude SARS-CoV-2 infection  and should not be used as the sole basis for treatment or other  patient management decisions.  A negative result may occur with  improper specimen  collection / handling, submission of specimen other  than nasopharyngeal swab, presence of viral mutation(s) within the  areas targeted by this assay, and inadequate number of viral copies  (<250 copies / mL). A negative result must be combined with clinical  observations, patient history, and epidemiological information. If result is POSITIVE SARS-CoV-2 target nucleic acids are DETECTED. The SARS-CoV-2 RNA is generally detectable in upper and lower  respiratory specimens dur ing the acute phase of infection.  Positive  results are indicative of active infection with SARS-CoV-2.  Clinical  correlation with patient history and other diagnostic information is  necessary to determine patient infection status.  Positive results do  not rule out bacterial infection or co-infection with other viruses. If result is PRESUMPTIVE POSTIVE SARS-CoV-2 nucleic acids MAY BE PRESENT.   A presumptive positive result was obtained on the submitted specimen  and confirmed on repeat testing.  While 2019 novel coronavirus  (SARS-CoV-2) nucleic acids may be present in the submitted sample  additional confirmatory testing may be necessary for epidemiological  and / or clinical management purposes  to differentiate between  SARS-CoV-2 and other Sarbecovirus currently known to infect humans.  If clinically indicated additional testing with an alternate test  methodology [phone]) is advised. The SARS-CoV-2 RNA is generally  detectable in upper and lower respiratory sp ecimens during the acute  phase of infection. The expected result is  Negative. Fact Sheet for Patients:  StrictlyIdeas.no Fact Sheet for Healthcare Providers: BankingDealers.co.za This test is not yet approved or cleared by the [place] FDA and has been authorized for detection and/or diagnosis of SARS-CoV-2 by FDA under an Emergency Use Authorization (EUA).  This EUA will remain in effect (meaning this test can be used) for the duration of the COVID-19 declaration under Section 564(b)(1) of the Act, 21 U.S.C. section 360bbb-3(b)(1), unless the authorization is terminated or revoked sooner. Performed at [facility], [place] [address], [place], [place] [address]   Body fluid culture     Status: None (Preliminary result)   Collection Time: 10/30/18  5:52 PM   Specimen: Synovium; Body Fluid  Result Value Ref Range Status   Specimen Description SYNOVIAL BURSA/CYST  Final   Special Requests NONE  Final   Gram Stain   Final    NO ORGANISMS SEEN WBC PRESENT, PREDOMINANTLY PMN CYTOSPIN SMEAR Gram Stain Report Called to,Read Back By and Verified With: Mort Sawyers [phone] @ [place] Performed at [facility], [place] [address] [place], [place] [address]    Culture PENDING  Incomplete   Report Status PENDING  Incomplete     Radiological Exams on Admission: Dg Chest Port 1 View  Result Date: 10/30/2018 CLINICAL DATA:  Cough, fever, shortness of breath. EXAM: PORTABLE CHEST 1 VIEW COMPARISON:  03/06/2018 FINDINGS: Mild enlargement of the cardiopericardial silhouette, with indistinctness of the pulmonary vasculature but no definite interstitial edema. Thoracic spondylosis is present. No appreciable airspace opacity. No blunting of the costophrenic angles. IMPRESSION: 1. Mild enlargement of the cardiopericardial silhouette potentially with pulmonary venous hypertension, but no overt edema. Electronically Signed   By: Van Clines M.D.   On: 10/30/2018 18:06     EKG: Independently reviewed. Sinus rhythm, rate 70, QTc 429 ms.   Assessment/Plan  1. Right olecranon bursitis  - Presents with fevers and gen weakness, found to have leukocytosis with reassuringly normal lactate, clear CXR, no urinary sxs, and tender, red, and hot area over the right olecranon process  - Fluid  was aspirated from right olecranon bursa in ED, noted to be turbid with 46k WBC, no organisms on gram stain  - Blood and synovial fluid sent for cultures and empiric vancomycin started in the ED  - Hand surgery is consulting and much appreciated  - Continue current antibiotics, keep NPO pending surgeon's evaluation, follow cultures and clinical course    2. CKD stage III-IV  - SCr is 2.24 on admission, up from 1.83 in March  - Renally-dose medications   3. Chronic diastolic CHF  - Appears compensated  - SLIV, hold diuretics while NPO, follow daily wt and I/O's   4. Insulin-dependent DM  - No recent A1c  - Managed with Tyler Aas and Humalog at home  - Continue basal and sliding-scale insulins   5. Thrombocytopenia  - Platelets 148k on admission, previously normal  - Likely secondary to infection, continue antibiotics and monitor     PPE: Mask, face shield. Patient wearing mask.  DVT prophylaxis: SCD's  Code Status: Full  Family Communication: Discussed with patient  Consults called: Hand surgery consulted by ED physician  Admission status: Observation     Vianne Bulls, MD [facility] Pager [phone]  If 7PM-7AM, please contact night-coverage [email or web address] Password [facility]  10/30/2018, 8:09 PM

## 2018-10-30 NOTE — ED Notes (Signed)
Patient has an area of possible cellulitis on right elbow. Skin is tight, and shiny and patient reports pain to the site.

## 2018-10-30 NOTE — ED Notes (Signed)
Bed: WA01 Expected date:  Expected time:  Means of arrival:  Comments: EMS General weakneess

## 2018-10-30 NOTE — ED Notes (Signed)
Dr. Maryan Rued aware of synovial fluid results

## 2018-10-30 NOTE — Progress Notes (Signed)
Pharmacy Antibiotic Note  Amy [name] is a 58 y.o. female admitted on 10/30/2018 with cellulitis.  Pharmacy has been consulted for vancomycin dosing. Pt with R olecranon bursitis, CKD stage III-IV. Weight on 08/23/18 was 100.7 kg   Plan: vancomcycin 2 gm loading dose (1 gm x 2 doses) Vancomycin 1000 mg IV Q 48 hrs. Goal AUC 400-550. Expected AUC: 503.9 32.5/12.9 SCr used: 2.24  Vd 0.5. Wt 100.7 kg F/u renal fxn, WBC, temp, culture data, ortho consult vanc levels as needed   Temp (24hrs), Avg:98.3 F (36.8 C), Min:98.3 F (36.8 C), Max:98.3 F (36.8 C)  Recent Labs  Lab 10/30/18 1514 10/30/18 1713  WBC 16.9*  --   CREATININE 2.24*  --   LATICACIDVEN  --  1.3    CrCl cannot be calculated (Unknown ideal weight.).    Allergies  Allergen Reactions  . Penicillins Hives and Swelling    Has patient had a PCN reaction causing immediate rash, facial/tongue/throat swelling, SOB or lightheadedness with hypotension: yes- face swelling Has patient had a PCN reaction causing severe rash involving mucus membranes or skin necrosis: no Has patient had a PCN reaction that required hospitalization unknown (childhood allergy) Has patient had a PCN reaction occurring within the last 10 years: no If all of the above answers are "NO", then may proceed with Cephalosporin use.   . Codeine Nausea Only  . Iodine-131 Hives  . Iohexol Hives    Pt developed itching and hives along with nasal congestion; needs 13 hour premeds for future studies, Onset Date: [id number]   . Lisinopril Cough  . Sulfa Antibiotics Nausea And Vomiting    Antimicrobials this admission: 5/16 vanc>> Dose adjustments this admission:  Microbiology results: 6/16 synovial bursa/cyst: gram stain neg;  6/16 BCx2: sent 6/16 COVID 19 neg  Thank you for allowing pharmacy to be a part of this patient's care.  Eudelia Bunch, Pharm.D [phone] 10/30/2018 9:25 PM

## 2018-10-30 NOTE — ED Provider Notes (Addendum)
[facility] DEPT Provider Note   CSN: [id number] Arrival date & time: 10/30/18  1441     History   Chief Complaint Chief Complaint  Patient presents with  . Weakness    HPI Amy [name] is a 58 y.o. female.     Patient is a 58 year old female with a history of asthma, diabetes, hypertension, chronic kidney disease, CHF and blindness presenting today with multiple symptoms.  Patient states approximately 3 days ago she started running a fever, decreased appetite, general malaise.  She saw her doctor yesterday and was swabbed for COVID as she has had a dry cough and some mild shortness of breath.  She states today she just feels generally weak and is having difficulty getting up and moving around her home.  She continued to have a fever today and is also noticed some significant pain in her right elbow but is not sure how long it is been going on.  Patient did take 1 dose of antibiotics this morning which was prescribed yesterday from her doctor but states she is not feeling any better.  She denies any abdominal pain, nausea, vomiting or urinary issues.  She has not had any diarrhea.  The history is provided by the patient.  Weakness   Past Medical History:  Diagnosis Date  . Arthritis   . Asthma   . Blind   . Blindness and low vision    left eye glass eye,  legally blind in right eye  . Diabetes mellitus   . Diabetic neuropathy ([place])   . Hyperlipidemia   . Hypertension   . Hypothyroidism   . Spinal stenosis     Patient Active Problem List   Diagnosis Date Noted  . CRI (chronic renal insufficiency), stage 3 (moderate) ([place]) 07/26/2018  . Morbid obesity ([place]) 07/26/2018  . Intractable nausea and vomiting 03/06/2018  . Right leg pain 12/19/2017  . Leukocytosis 12/19/2017  . Benign positional vertigo 12/19/2017  . Weakness 12/12/2017  . Laryngopharyngeal reflux (LPR) 10/19/2017  . Post-nasal drainage 10/19/2017  . Acute sinusitis  10/19/2017  . Degeneration of lumbar intervertebral disc 07/29/2017  . 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 Obstructive sleep apnea on CPAP 02/09/2017  . Chronic diastolic heart failure ([place]) 11/08/2016  . Near syncope 01/30/2016  . SOB (shortness of breath)   . CAP (community acquired pneumonia) 09/12/2015  . Hypoxia 09/12/2015  . Essential hypertension 07/05/2015  . Hypotension 07/05/2015  . Diabetes mellitus with neurological manifestations ([place]) 11/04/2014  . Hyperlipidemia LDL goal <70 11/04/2014  . Spinal stenosis, multilevel 11/04/2014  . Hyperkalemia 11/01/2014  . Hypoglycemia 08/09/2014  . Insulin dependent diabetes mellitus with complications ([place]) 77/82/4235  . Elevated troponin 08/08/2014  . Nausea vomiting and diarrhea 08/08/2014  . 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 Dizziness 08/06/2012  . Orthostatic hypotension 08/06/2012  . Hypernatremia 08/06/2012  . Acute gastroenteritis 08/13/2011  . Gastroparesis 08/13/2011  . Low back pain 08/13/2011  . Oral thrush 08/13/2011  . Gastroenteritis 05/23/2011  . Dehydration 05/23/2011  . Blindness 05/23/2011  . DM type 1, not at goal, causing eye disease ([place]) 05/23/2011  . Asthma 05/23/2011  . Diarrhea 05/23/2011  . Vomiting 05/23/2011  . Hypothyroidism 05/23/2011  . Diabetic neuropathy ([place]) 05/23/2011  . Diabetic nephropathy ([place]) 05/23/2011    Past Surgical History:  Procedure Laterality Date  .  ABDOMINAL HYSTERECTOMY  1990  . [place]   x 2  . CHOLECYSTECTOMY  2008  . ENUCLEATION Bilateral 09/15/1998  . EYE SURGERY  2016   fitting artificial eye     OB History   No obstetric history on file.    Obstetric Comments  Pt has two sons.         Home Medications    Prior to Admission medications   Medication Sig Start Date End Date  Taking? Authorizing Provider  albuterol (PROVENTIL HFA;VENTOLIN HFA) 108 (90 BASE) MCG/ACT inhaler Inhale 2 puffs into the lungs every 4 (four) hours as needed for wheezing or shortness of breath. For short    [provider]  aspirin EC 81 MG tablet Take 81 mg by mouth daily.    [provider]  B-D UF III MINI PEN NEEDLES 31G X 5 MM MISC U 1 PEN NEEDLE QID WITH EACH INJECTION 08/01/18   [provider]  cetirizine (ZYRTEC) 10 MG tablet TK 1 T PO QD 05/31/18   [provider]  esomeprazole (NEXIUM) 40 MG capsule Take 40 mg by mouth daily at 12 noon.     [provider]  fluticasone (FLONASE) 50 MCG/ACT nasal spray Place 1 spray into both nostrils daily.     [provider]  furosemide (LASIX) 40 MG tablet Take 1 tablet (40 mg total) by mouth 2 (two) times daily. 07/26/18   Erlene Quan, PA-C  guaiFENesin (MUCINEX) 600 MG 12 hr tablet Take 1 tablet (600 mg total) by mouth 2 (two) times daily. 07/09/18   Lendon Colonel, NP  HUMALOG KWIKPEN 100 UNIT/ML KiwkPen Inject into the skin 3 (three) times daily. 12-12-17 take 8 units in morning, 10 units at lunch, 14 units at dinner 01/24/17   [provider]  levothyroxine (SYNTHROID, LEVOTHROID) 150 MCG tablet Take 150 mcg by mouth daily before breakfast.    [provider]  Linaclotide (LINZESS) 145 MCG CAPS capsule Take 145 mcg by mouth daily as needed (constipation).     [provider]  methocarbamol (ROBAXIN) 500 MG tablet  08/14/18   [provider]  metolazone (ZAROXOLYN) 2.5 MG tablet Take 1 tablet (2.5 mg total) by mouth daily. 07/26/18   Erlene Quan, PA-C  potassium chloride SA (K-DUR,KLOR-CON) 20 MEQ tablet TAKE 1 TABLET (20 MEQ TOTAL) BY MOUTH DAILY 07/04/18   Bensimhon, Shaune Pascal, MD  pregabalin (LYRICA) 150 MG capsule Take 150 mg by mouth 3 (three) times daily.  01/01/18   [provider]  simvastatin (ZOCOR) 20 MG tablet Take 20 mg by mouth daily.     [provider]  spironolactone (ALDACTONE) 25 MG tablet Take 0.5 tablets (12.5 mg total) by mouth daily. 10/30/17   Shirley Friar, PA-C  torsemide (DEMADEX) 20 MG tablet  10/04/18   [provider]  TRESIBA FLEXTOUCH 200 UNIT/ML SOPN Inject 60 Units into the skin at bedtime.  05/30/18   [provider]  triamcinolone cream (KENALOG) 0.1 %  05/30/18   [provider]    Family History Family History  Problem Relation Age of Onset  . Diabetes Sister   . Glaucoma Sister   . Hypertension Sister   . Stroke Brother   . Heart attack Paternal Aunt   . Breast cancer Neg Hx     Social History Social History   Tobacco Use  . Smoking status: Former Smoker    Types: Cigarettes    Quit  date: 05/22/1992    Years since quitting: 26.4  . Smokeless tobacco: Never Used  Substance Use Topics  . Alcohol use: No    Comment: quit 20 yrs ago  . Drug use: No     Allergies   Penicillins, Codeine, Iodine-131, Iohexol, Lisinopril, and Sulfa antibiotics   Review of Systems Review of Systems  Neurological: Positive for weakness.  All other systems reviewed and are negative.    Physical Exam Updated Vital Signs BP 122/70   Pulse 67   Temp 98.3 F (36.8 C) (Oral)   Resp 17   SpO2 100%   Physical Exam Vitals signs and nursing note reviewed.  Constitutional:      General: She is not in acute distress.    Appearance: She is well-developed.  HENT:     Head: Normocephalic and atraumatic.     Mouth/Throat:     Mouth: Mucous membranes are dry.  Eyes:     Comments: Left eye enucleation.  Right eye blindness  Neck:     Musculoskeletal: Normal range of motion and neck supple.  Cardiovascular:     Rate and Rhythm: Normal rate and regular rhythm.     Pulses: Normal pulses.     Heart sounds: No murmur.  Pulmonary:     Effort: Pulmonary effort is normal. No respiratory distress.     Breath sounds: Normal breath sounds. No wheezing or rales.   Abdominal:     General: There is no distension.     Palpations: Abdomen is soft.     Tenderness: There is no abdominal tenderness. There is no guarding or rebound.  Musculoskeletal: Normal range of motion.        General: Tenderness present.       Arms:     Right lower leg: No edema.     Left lower leg: No edema.  Skin:    General: Skin is warm and dry.     Findings: No erythema or rash.  Neurological:     General: No focal deficit present.     Mental Status: She is alert and oriented to person, place, and time. Mental status is at baseline.  Psychiatric:        Mood and Affect: Mood normal.        Behavior: Behavior normal.        Thought Content: Thought content normal.      ED Treatments / Results  Labs (all labs ordered are listed, but only abnormal results are displayed) Labs Reviewed  BASIC METABOLIC PANEL - Abnormal; Notable for the following components:      Result Value   Glucose, Bld 128 (*)    BUN 67 (*)    Creatinine, Ser 2.24 (*)    GFR calc non Af Amer 23 (*)    GFR calc Af Amer 27 (*)    All other components within normal limits  CBC - Abnormal; Notable for the following components:   WBC 16.9 (*)    Hemoglobin 11.7 (*)    MCH 25.1 (*)    RDW 15.9 (*)    Platelets 148 (*)    All other components within normal limits  HEPATIC FUNCTION PANEL - Abnormal; Notable for the following components:   Albumin 3.2 (*)    All other components within normal limits  SYNOVIAL CELL COUNT + DIFF, W/ CRYSTALS - Abnormal; Notable for the following components:   Appearance-Synovial TURBID (*)    WBC, Synovial 46,000 (*)    Neutrophil, Synovial  96 (*)    Monocyte-Macrophage-Synovial Fluid 4 (*)    All other components within normal limits  SARS CORONAVIRUS 2 (HOSPITAL ORDER, PERFORMED IN [facility])  BODY FLUID CULTURE  CULTURE, BLOOD (ROUTINE X 2)  CULTURE, BLOOD (ROUTINE X 2)  LACTIC ACID, PLASMA  LACTIC ACID, PLASMA  URINALYSIS, ROUTINE W REFLEX  MICROSCOPIC  CBG MONITORING, ED    EKG EKG Interpretation  Date/Time:  Tuesday October 30 2018 15:00:59 EDT Ventricular Rate:  70 PR Interval:    QRS Duration: 90 QT Interval:  397 QTC Calculation: 429 R Axis:   36 Text Interpretation:  Sinus rhythm No significant change since last tracing Confirmed by Blanchie Dessert ([id number]) on 10/30/2018 4:17:21 PM   Radiology Dg Chest Port 1 View  Result Date: 10/30/2018 CLINICAL DATA:  Cough, fever, shortness of breath. EXAM: PORTABLE CHEST 1 VIEW COMPARISON:  03/06/2018 FINDINGS: Mild enlargement of the cardiopericardial silhouette, with indistinctness of the pulmonary vasculature but no definite interstitial edema. Thoracic spondylosis is present. No appreciable airspace opacity. No blunting of the costophrenic angles. IMPRESSION: 1. Mild enlargement of the cardiopericardial silhouette potentially with pulmonary venous hypertension, but no overt edema. Electronically Signed   By: Van Clines M.D.   On: 10/30/2018 18:06    Procedures .Marland KitchenIncision and Drainage  Date/Time: 10/30/2018 6:24 PM Performed by: Blanchie Dessert, MD Authorized by: Blanchie Dessert, MD   Consent:    Consent obtained:  Verbal   Consent given by:  Patient   Risks discussed:  Bleeding and infection   Alternatives discussed:  No treatment Location:    Type:  Bursa   Size:  3x2cm   Location:  Upper extremity   Upper extremity location:  Elbow   Elbow location:  R elbow Pre-procedure details:    Skin preparation:  Chloraprep Sedation:    Sedation type: none. Anesthesia (see MAR for exact dosages):    Anesthesia method:  None Procedure type:    Complexity:  Simple Procedure details:    Needle aspiration: yes     Needle size:  22 G   Incision types:  Single straight   Incision depth:  Subcutaneous   Drainage:  Purulent   Drainage amount: 102mL.   Packing materials:  None Post-procedure details:    Patient tolerance of procedure:  Tolerated well, no  immediate complications   (including critical care time)  Medications Ordered in ED Medications  0.9 %  sodium chloride infusion (1,000 mLs Intravenous New Bag/Given 10/30/18 1705)  vancomycin (VANCOCIN) IVPB 1000 mg/200 mL premix (has no administration in time range)     Initial Impression / Assessment and Plan / ED Course  I have reviewed the triage vital signs and the nursing notes.  Pertinent labs & imaging results that were available during my care of the patient were reviewed by me and considered in my medical decision making (see chart for details).       Patient presenting today with symptoms of fever, general malaise, decreased appetite and concerns for sepsis.  Patient was given a dose of antibiotics for possible URI symptoms from her PCP yesterday but states she was feeling worse today.  Possibility for COVID however no evidence of pneumonia on x-ray and patient is satting 99% on room air without acute lung findings.  Patient does have significant pain warmth and erythema to the right elbow concerning for possible septic bursitis.  Patient has a leukocytosis of 17,000 and new AKI with a creatinine of 2.24 from her baseline of 1.8.  Patient has no overt signs of heart failure today and lactic acid is within normal limits.  LFTs are normal.  Patient was started on vancomycin for presumed septic bursitis as she has a penicillin allergy.  Fluid was removed and sent for testing.  Because lactate is within normal limits patient was just started on IV fluids at a rate of 125.  7:55 PM No real fluid is concerning for bacterial infection with a white count of 46,000.  Spoke with Dr. Jeannie Fend with hand surgery and he recommended x-rays and he will follow-up with the patient in the morning.  We will continue antibiotics at this time.  Patient admitted to the hospitalist service.  Amy [name] was evaluated in Emergency Department on 10/30/2018 for the symptoms described in the history of  present illness. She was evaluated in the context of the global COVID-19 pandemic, which necessitated consideration that the patient might be at risk for infection with the SARS-CoV-2 virus that causes COVID-19. Institutional protocols and algorithms that pertain to the evaluation of patients at risk for COVID-19 are in a state of rapid change based on information released by regulatory bodies including the CDC and federal and state organizations. These policies and algorithms were followed during the patient's care in the ED.  Final Clinical Impressions(s) / ED Diagnoses   Final diagnoses:  Sepsis with acute renal failure without septic shock, due to unspecified organism, unspecified acute renal failure type ([place])  Septic bursitis of elbow, right    ED Discharge Orders    None       Blanchie Dessert, MD 10/30/18 Lona Kettle    Blanchie Dessert, MD 10/30/18 2103

## 2018-10-31 DIAGNOSIS — G4733 Obstructive sleep apnea (adult) (pediatric): Secondary | ICD-10-CM | POA: Diagnosis present

## 2018-10-31 DIAGNOSIS — N184 Chronic kidney disease, stage 4 (severe): Secondary | ICD-10-CM | POA: Diagnosis present

## 2018-10-31 DIAGNOSIS — R531 Weakness: Secondary | ICD-10-CM | POA: Diagnosis present

## 2018-10-31 DIAGNOSIS — E1140 Type 2 diabetes mellitus with diabetic neuropathy, unspecified: Secondary | ICD-10-CM | POA: Diagnosis present

## 2018-10-31 DIAGNOSIS — Z7982 Long term (current) use of aspirin: Secondary | ICD-10-CM | POA: Diagnosis not present

## 2018-10-31 DIAGNOSIS — A419 Sepsis, unspecified organism: Secondary | ICD-10-CM | POA: Diagnosis present

## 2018-10-31 DIAGNOSIS — Z79890 Hormone replacement therapy (postmenopausal): Secondary | ICD-10-CM | POA: Diagnosis not present

## 2018-10-31 DIAGNOSIS — E039 Hypothyroidism, unspecified: Secondary | ICD-10-CM | POA: Diagnosis present

## 2018-10-31 DIAGNOSIS — N183 Chronic kidney disease, stage 3 (moderate): Secondary | ICD-10-CM | POA: Diagnosis not present

## 2018-10-31 DIAGNOSIS — M71121 Other infective bursitis, right elbow: Secondary | ICD-10-CM | POA: Diagnosis present

## 2018-10-31 DIAGNOSIS — I5032 Chronic diastolic (congestive) heart failure: Secondary | ICD-10-CM | POA: Diagnosis present

## 2018-10-31 DIAGNOSIS — E118 Type 2 diabetes mellitus with unspecified complications: Secondary | ICD-10-CM | POA: Diagnosis not present

## 2018-10-31 DIAGNOSIS — Z833 Family history of diabetes mellitus: Secondary | ICD-10-CM | POA: Diagnosis not present

## 2018-10-31 DIAGNOSIS — Z20828 Contact with and (suspected) exposure to other viral communicable diseases: Secondary | ICD-10-CM | POA: Diagnosis present

## 2018-10-31 DIAGNOSIS — Z9989 Dependence on other enabling machines and devices: Secondary | ICD-10-CM

## 2018-10-31 DIAGNOSIS — H548 Legal blindness, as defined in USA: Secondary | ICD-10-CM | POA: Diagnosis present

## 2018-10-31 DIAGNOSIS — I10 Essential (primary) hypertension: Secondary | ICD-10-CM

## 2018-10-31 DIAGNOSIS — Z6841 Body Mass Index (BMI) 40.0 and over, adult: Secondary | ICD-10-CM | POA: Diagnosis not present

## 2018-10-31 DIAGNOSIS — Z823 Family history of stroke: Secondary | ICD-10-CM | POA: Diagnosis not present

## 2018-10-31 DIAGNOSIS — Z8249 Family history of ischemic heart disease and other diseases of the circulatory system: Secondary | ICD-10-CM | POA: Diagnosis not present

## 2018-10-31 DIAGNOSIS — E785 Hyperlipidemia, unspecified: Secondary | ICD-10-CM | POA: Diagnosis present

## 2018-10-31 DIAGNOSIS — E1122 Type 2 diabetes mellitus with diabetic chronic kidney disease: Secondary | ICD-10-CM | POA: Diagnosis present

## 2018-10-31 DIAGNOSIS — I130 Hypertensive heart and chronic kidney disease with heart failure and stage 1 through stage 4 chronic kidney disease, or unspecified chronic kidney disease: Secondary | ICD-10-CM | POA: Diagnosis present

## 2018-10-31 DIAGNOSIS — Z7951 Long term (current) use of inhaled steroids: Secondary | ICD-10-CM | POA: Diagnosis not present

## 2018-10-31 DIAGNOSIS — N179 Acute kidney failure, unspecified: Secondary | ICD-10-CM | POA: Diagnosis present

## 2018-10-31 DIAGNOSIS — Z794 Long term (current) use of insulin: Secondary | ICD-10-CM | POA: Diagnosis not present

## 2018-10-31 DIAGNOSIS — R6520 Severe sepsis without septic shock: Secondary | ICD-10-CM | POA: Diagnosis present

## 2018-10-31 DIAGNOSIS — Z83511 Family history of glaucoma: Secondary | ICD-10-CM | POA: Diagnosis not present

## 2018-10-31 DIAGNOSIS — J45909 Unspecified asthma, uncomplicated: Secondary | ICD-10-CM | POA: Diagnosis present

## 2018-10-31 LAB — GLUCOSE, CAPILLARY
Glucose-Capillary: 103 mg/dL — ABNORMAL HIGH (ref 70–99)
Glucose-Capillary: 110 mg/dL — ABNORMAL HIGH (ref 70–99)
Glucose-Capillary: 137 mg/dL — ABNORMAL HIGH (ref 70–99)
Glucose-Capillary: 192 mg/dL — ABNORMAL HIGH (ref 70–99)
Glucose-Capillary: 195 mg/dL — ABNORMAL HIGH (ref 70–99)
Glucose-Capillary: 206 mg/dL — ABNORMAL HIGH (ref 70–99)
Glucose-Capillary: 42 mg/dL — CL (ref 70–99)
Glucose-Capillary: 47 mg/dL — ABNORMAL LOW (ref 70–99)
Glucose-Capillary: 49 mg/dL — ABNORMAL LOW (ref 70–99)
Glucose-Capillary: 62 mg/dL — ABNORMAL LOW (ref 70–99)
Glucose-Capillary: 66 mg/dL — ABNORMAL LOW (ref 70–99)

## 2018-10-31 LAB — CBC WITH DIFFERENTIAL/PLATELET
Abs Immature Granulocytes: 0.05 10*3/uL (ref 0.00–0.07)
Basophils Absolute: 0 10*3/uL (ref 0.0–0.1)
Basophils Relative: 0 %
Eosinophils Absolute: 0.1 10*3/uL (ref 0.0–0.5)
Eosinophils Relative: 1 %
HCT: 46.5 % — ABNORMAL HIGH (ref 36.0–46.0)
Hemoglobin: 14.1 g/dL (ref 12.0–15.0)
Immature Granulocytes: 1 %
Lymphocytes Relative: 19 %
Lymphs Abs: 1.9 10*3/uL (ref 0.7–4.0)
MCH: 24.8 pg — ABNORMAL LOW (ref 26.0–34.0)
MCHC: 30.3 g/dL (ref 30.0–36.0)
MCV: 81.9 fL (ref 80.0–100.0)
Monocytes Absolute: 1.1 10*3/uL — ABNORMAL HIGH (ref 0.1–1.0)
Monocytes Relative: 11 %
Neutro Abs: 7 10*3/uL (ref 1.7–7.7)
Neutrophils Relative %: 68 %
Platelets: 145 10*3/uL — ABNORMAL LOW (ref 150–400)
RBC: 5.68 MIL/uL — ABNORMAL HIGH (ref 3.87–5.11)
RDW: 15.9 % — ABNORMAL HIGH (ref 11.5–15.5)
WBC: 10.2 10*3/uL (ref 4.0–10.5)
nRBC: 0 % (ref 0.0–0.2)

## 2018-10-31 LAB — BASIC METABOLIC PANEL
Anion gap: 15 (ref 5–15)
BUN: 63 mg/dL — ABNORMAL HIGH (ref 6–20)
CO2: 27 mmol/L (ref 22–32)
Calcium: 9.6 mg/dL (ref 8.9–10.3)
Chloride: 101 mmol/L (ref 98–111)
Creatinine, Ser: 1.91 mg/dL — ABNORMAL HIGH (ref 0.44–1.00)
GFR calc Af Amer: 33 mL/min — ABNORMAL LOW (ref >60)
GFR calc non Af Amer: 28 mL/min — ABNORMAL LOW (ref >60)
Glucose, Bld: 96 mg/dL (ref 70–99)
Potassium: 3.8 mmol/L (ref 3.5–5.1)
Sodium: 143 mmol/L (ref 135–145)

## 2018-10-31 MED ORDER — SODIUM CHLORIDE 0.9 % IV SOLN
1.0000 g | INTRAVENOUS | Status: DC
  Administered 2018-10-31 – 2018-11-02 (×3): 1 g via INTRAVENOUS
  Filled 2018-10-31 (×3): qty 1
  Filled 2018-10-31: qty 10

## 2018-10-31 MED ORDER — DEXTROSE 50 % IV SOLN
12.5000 g | Freq: Once | INTRAVENOUS | Status: AC
  Administered 2018-10-31: 12.5 g via INTRAVENOUS

## 2018-10-31 MED ORDER — DEXTROSE 50 % IV SOLN
INTRAVENOUS | Status: AC
  Administered 2018-10-31: 12.5 g via INTRAVENOUS
  Filled 2018-10-31: qty 50

## 2018-10-31 MED ORDER — VANCOMYCIN HCL IN DEXTROSE 1-5 GM/200ML-% IV SOLN: 1000.0000 mg | INTRAVENOUS | Status: DC

## 2018-10-31 MED ORDER — MORPHINE SULFATE (PF) 2 MG/ML IV SOLN: 1.0000 mg | INTRAVENOUS | Status: DC | PRN

## 2018-10-31 MED ORDER — INSULIN GLARGINE 100 UNIT/ML ~~LOC~~ SOLN
10.0000 [IU] | Freq: Every day | SUBCUTANEOUS | Status: DC
  Administered 2018-10-31 – 2018-11-04 (×5): 10 [IU] via SUBCUTANEOUS
  Filled 2018-10-31 (×5): qty 0.1

## 2018-10-31 NOTE — Progress Notes (Signed)
   Ortho Hand Progress Note  Subjective: No acute events last night. States right elbow pain improved after IV abx over night.    Objective: Vital signs in last 24 hours: Temp:  [97.6 F (36.4 C)-98.3 F (36.8 C)] 98.2 F (36.8 C) (06/17 0756) Pulse Rate:  [66-70] 66 (06/17 0756) Resp:  [15-19] 16 (06/17 0756) BP: (112-140)/(63-70) 140/68 (06/17 0756) SpO2:  [96 %-100 %] 96 % (06/17 0756) Weight:  [99.7 kg] 99.7 kg (06/17 0812)  Intake/Output from previous day: 06/16 0701 - 06/17 0700 In: 690 [P.O.:240; IV Piggyback:450] Out: 1000 [Urine:1000] Intake/Output this shift: Total I/O In: 0  Out: 500 [Urine:500]  Recent Labs    10/30/18 1514 10/31/18 0356  HGB 11.7* 14.1   Recent Labs    10/30/18 1514 10/31/18 0356  WBC 16.9* 10.2  RBC 4.67 5.68*  HCT 37.8 46.5*  PLT 148* 145*   Recent Labs    10/30/18 1514 10/31/18 0356  NA 144 143  K 3.7 3.8  CL 102 101  CO2 29 27  BUN 67* 63*  CREATININE 2.24* 1.91*  GLUCOSE 128* 96  CALCIUM 9.2 9.6   No results for input(s): LABPT, INR in the last 72 hours.   Physical Exam: General: Alert, no acute distress Cardiovascular: No pedal edema Respiratory: No cyanosis, no use of accessory musculature Skin: No lesions in the area of chief complaint Neurologic: Sensation intact distally Psychiatric: Patient is competent for consent with normal mood and affect Lymphatic: No axillary or cervical lymphadenopathy  MUSCULOSKELETAL: Examination of the right upper extremity shows improved swelling to the posterior aspect of the elbow.  There is decreased erythema to the area as well.  Posterior elbow ttp improved. The prior olecranon bursa which was inflammed and tender has significantly decreased in size.  She has near full range of motion of the elbow but has pain at the extremes of flexion posteriorly.  She is no tenderness anteriorly in the elbow.  Her fingertips are all warm and well-perfused with brisk capillary refill.  Her  sensation is intact light touch throughout all digits.   Assessment/Plan: 57 yo F with right elbow septic olecranon bursitis  - Hospitalist primary. Appreciate management - Continue abx treatment as she has had a great early response with decreased pain, erythema and size of the bursa - No plans for operative treatment at this time - CXs from ED aspiration are NGTD   Avanell Shackleton III 10/31/2018, 12:17 PM  [phone]

## 2018-10-31 NOTE — Progress Notes (Signed)
Pharmacy Antibiotic Note  Amy [name] is a 58 y.o. female admitted on 10/30/2018 with cellulitis.  Pharmacy has been consulted for vancomycin dosing. Pt with R olecranon bursitis, CKD stage III-IV. Weight on 08/23/18 was 100.7 kg   Plan: Increase Vancomycin 750mg  IV Q 24hrs. Goal AUC 400-550. F/u renal fxn, cx data Vanc levels as needed  Weight: 219 lb 12.8 oz (99.7 kg)  Temp (24hrs), Avg:98.1 F (36.7 C), Min:97.6 F (36.4 C), Max:98.3 F (36.8 C)  Recent Labs  Lab 10/30/18 1514 10/30/18 1713 10/31/18 0356  WBC 16.9*  --  10.2  CREATININE 2.24*  --  1.91*  LATICACIDVEN  --  1.3  --     Estimated Creatinine Clearance: 32.9 mL/min (A) (by C-G formula based on SCr of 1.91 mg/dL (H)).    Allergies  Allergen Reactions  . Penicillins Hives and Swelling    Has patient had a PCN reaction causing immediate rash, facial/tongue/throat swelling, SOB or lightheadedness with hypotension: yes- face swelling Has patient had a PCN reaction causing severe rash involving mucus membranes or skin necrosis: no Has patient had a PCN reaction that required hospitalization unknown (childhood allergy) Has patient had a PCN reaction occurring within the last 10 years: no If all of the above answers are "NO", then may proceed with Cephalosporin use.   . Codeine Nausea Only  . Iodine-131 Hives  . Iohexol Hives    Pt developed itching and hives along with nasal congestion; needs 13 hour premeds for future studies, Onset Date: [id number]   . Lisinopril Cough  . Sulfa Antibiotics Nausea And Vomiting    Antimicrobials this admission: 6/16 vanc>> Dose adjustments this admission: 6/17 Increase Vanc 750mg  IV q24h for improving renal fxn  Microbiology results: 6/16 synovial bursa/cyst: NGTD 6/16 BCx2: sent 6/16 COVID 19 neg  Thank you for allowing pharmacy to be a part of this patient's care.  Netta Cedars, PharmD, BCPS 10/31/2018 12:14 PM

## 2018-10-31 NOTE — Progress Notes (Signed)
Pt blood sugar checked at 0811 and was 62 Pt given 12.5g of D 50. Rechecked blood sugar 15 minutes later and blood sugar went up to 137. Will continue to monitor.

## 2018-10-31 NOTE — Progress Notes (Signed)
PROGRESS NOTE    Amy [name]  WRU:[id number] DOB: 01/29/1961 DOA: 10/30/2018 PCP: Velna Hatchet, MD    Brief Narrative:  58 year old female who presented with fever and generalized weakness.  She does have significant past medical history of diabetes mellitus type 2, chronic kidney disease stage III, diastolic heart failure and obstructive sleep apnea.  She reported right elbow pain, persistent cough worse with movement.  As an outpatient she was recently diagnosed with a respiratory illness placed on antibiotic therapy.  On her initial physical examination, blood pressure 112/63, heart rate 70, respiratory rate 17, temperature 98.3, oxygen saturation 99%.  Was mucous membranes, lungs clear to auscultation bilaterally, heart S1-2 present rhythmic, abdomen soft nontender, 1+ pretibial bilateral edema, right elbow joint with erythema, increased local temperature and edema.  Sodium 144, potassium 3.7, chloride 102, bicarb 39, glucose 128, BUN 67, creatinine 2.24, white count 16.9, hemoglobin 9.7, hematocrit 37.8, platelets 148.  SARS COVID-19 was negative.  Urinalysis negative for infection.  Chest radiograph with bilateral increase vascular lung markings, no infiltrates.  Right elbow x-rays with no fractures. EKG with 70 bpm, normal axis, normal intervals, sinus rhythm with normal conduction, no ST segment or T wave changes.  Patient was admitted to the hospital working diagnosis right olecranon bursitis.    Assessment & Plan:   Principal Problem:   Septic bursitis of elbow, right Active Problems:   Asthma   Insulin dependent diabetes mellitus with complications (HCC)   Essential hypertension   Chronic diastolic heart failure (HCC)   Obstructive sleep apnea on CPAP   CKD (chronic kidney disease), stage III ([place])   1. Right elbow infective bursitis.  Patient continue to have pain and edema, wbc down to 10.2, synovial fluid gram stain with no organisms and culture with no growth. Patient  will require further IV antibiotic therapy for now, and will add IV analgesia with morphine. Will continue antibiotic therapy with ceftriaxone IV, patient has penicillin allergy but tolerated well cephalosporins in the past. Low suspicion for MRSA infection, will hold on IV vancomycin for now.   2. HTN. Blood pressure systolic 914 mmHg, will continue blood pressure monitoring.   3. AKI on CKD stage 3. Renal function with serum cr at 1,91 with K at 3,8 and serum bicarbonate at 27. Continue to encourage po intake. Follow on renal panel in am, avoid hypotension and nephrotoxic medications, will hold on Vancomycin for now.   4. Asthma.  No signs of acute exacerbation.   5. Diastolic heart failure. Stable with no signs of exacerbation, will continue blood pressure monitoring.   6. Obesity. BMI is 44,3. Will need outpatient follow up.   7. T2DM. Fasting glucose this am at 96, will reduce base insulin to 10 units from 30 units of levemir, continue insulin sliding scale for glucose cover and monitoring. Advance diet as tolerated.   8. Hypothyroid. Continue levothyroxine.   DVT prophylaxis: enoxaparin   Code Status: full Family Communication: no family at the bedside  Disposition Plan/ discharge barriers: pending clinical improvement.   Body mass index is 44.39 kg/m. Malnutrition Type:      Malnutrition Characteristics:      Nutrition Interventions:     RN Pressure Injury Documentation:     Consultants:   Orthopedic surgery   Procedures:   Right elbow arthrocentesis   Antimicrobials:       Subjective: Patient continue to have right elbow pain, worse with movement and touch, improved in intensity but not yet back to baseline,  limited mobility due to pain.   Objective: Vitals:   10/30/18 2127 10/31/18 0529 10/31/18 0756 10/31/18 0812  BP: 125/70 125/66 140/68   Pulse: 66 69 66   Resp: 19 18 16    Temp: 98.1 F (36.7 C) 97.6 F (36.4 C) 98.2 F (36.8 C)    TempSrc: Oral Oral Oral   SpO2: 98% 99% 96%   Weight:    99.7 kg    Intake/Output Summary (Last 24 hours) at 10/31/2018 1112 Last data filed at 10/31/2018 0936 Gross per 24 hour  Intake 690 ml  Output 1500 ml  Net -810 ml   Filed Weights   10/31/18 0812  Weight: 99.7 kg    Examination:   General: deconditioned  Neurology: Awake and alert, non focal  E ENT: mild pallor, no icterus, oral mucosa moist Cardiovascular: No JVD. S1-S2 present, rhythmic, no gallops, rubs, or murmurs. No lower extremity edema. Pulmonary: positive breath sounds bilaterally, adequate air movement, no wheezing, rhonchi or rales. Gastrointestinal. Abdomen protuberant no organomegaly, non tender, no rebound or guarding Skin. No rashes Musculoskeletal: no joint deformities/ right elbow with local edema, no significant erythema or increased local temperature, positive induration and decreased range of movement.      Data Reviewed: I have personally reviewed following labs and imaging studies  CBC: Recent Labs  Lab 10/30/18 1514 10/31/18 0356  WBC 16.9* 10.2  NEUTROABS  --  7.0  HGB 11.7* 14.1  HCT 37.8 46.5*  MCV 80.9 81.9  PLT 148* 062*   Basic Metabolic Panel: Recent Labs  Lab 10/30/18 1514 10/31/18 0356  NA 144 143  K 3.7 3.8  CL 102 101  CO2 29 27  GLUCOSE 128* 96  BUN 67* 63*  CREATININE 2.24* 1.91*  CALCIUM 9.2 9.6   GFR: Estimated Creatinine Clearance: 32.9 mL/min (A) (by C-G formula based on SCr of 1.91 mg/dL (H)). Liver Function Tests: Recent Labs  Lab 10/30/18 1515  AST 28  ALT 30  ALKPHOS 112  BILITOT 0.4  PROT 7.3  ALBUMIN 3.2*   No results for input(s): LIPASE, AMYLASE in the last 168 hours. No results for input(s): AMMONIA in the last 168 hours. Coagulation Profile: No results for input(s): INR, PROTIME in the last 168 hours. Cardiac Enzymes: No results for input(s): CKTOTAL, CKMB, CKMBINDEX, TROPONINI in the last 168 hours. BNP (last 3 results) Recent Labs     07/26/18 1058  PROBNP 120   HbA1C: No results for input(s): HGBA1C in the last 72 hours. CBG: Recent Labs  Lab 10/30/18 2342 10/31/18 0343 10/31/18 0730 10/31/18 0811 10/31/18 0845  GLUCAP 136* 103* 49* 62* 137*   Lipid Profile: No results for input(s): CHOL, HDL, LDLCALC, TRIG, CHOLHDL, LDLDIRECT in the last 72 hours. Thyroid Function Tests: No results for input(s): TSH, T4TOTAL, FREET4, T3FREE, THYROIDAB in the last 72 hours. Anemia Panel: No results for input(s): VITAMINB12, FOLATE, FERRITIN, TIBC, IRON, RETICCTPCT in the last 72 hours.    Radiology Studies: I have reviewed all of the imaging during this hospital visit personally     Scheduled Meds: . insulin aspart  0-9 Units Subcutaneous Q4H  . insulin glargine  30 Units Subcutaneous QHS  . levothyroxine  150 mcg Oral QAC breakfast  . pantoprazole  40 mg Oral Daily  . pregabalin  75 mg Oral BID  . simvastatin  20 mg Oral q1800  . sodium chloride flush  3 mL Intravenous Q12H   Continuous Infusions: . sodium chloride    . [START ON  11/01/2018] vancomycin       LOS: 0 days         Gerome Apley, MD

## 2018-11-01 LAB — BASIC METABOLIC PANEL
Anion gap: 12 (ref 5–15)
BUN: 52 mg/dL — ABNORMAL HIGH (ref 6–20)
CO2: 27 mmol/L (ref 22–32)
Calcium: 9 mg/dL (ref 8.9–10.3)
Chloride: 103 mmol/L (ref 98–111)
Creatinine, Ser: 1.73 mg/dL — ABNORMAL HIGH (ref 0.44–1.00)
GFR calc Af Amer: 37 mL/min — ABNORMAL LOW (ref >60)
GFR calc non Af Amer: 32 mL/min — ABNORMAL LOW (ref >60)
Glucose, Bld: 178 mg/dL — ABNORMAL HIGH (ref 70–99)
Potassium: 4 mmol/L (ref 3.5–5.1)
Sodium: 142 mmol/L (ref 135–145)

## 2018-11-01 LAB — CBC WITH DIFFERENTIAL/PLATELET
Abs Immature Granulocytes: 0.03 10*3/uL (ref 0.00–0.07)
Basophils Absolute: 0 10*3/uL (ref 0.0–0.1)
Basophils Relative: 0 %
Eosinophils Absolute: 0.2 10*3/uL (ref 0.0–0.5)
Eosinophils Relative: 1 %
HCT: 37.6 % (ref 36.0–46.0)
Hemoglobin: 11.7 g/dL — ABNORMAL LOW (ref 12.0–15.0)
Immature Granulocytes: 0 %
Lymphocytes Relative: 15 %
Lymphs Abs: 1.7 10*3/uL (ref 0.7–4.0)
MCH: 25.3 pg — ABNORMAL LOW (ref 26.0–34.0)
MCHC: 31.1 g/dL (ref 30.0–36.0)
MCV: 81.2 fL (ref 80.0–100.0)
Monocytes Absolute: 1.3 10*3/uL — ABNORMAL HIGH (ref 0.1–1.0)
Monocytes Relative: 11 %
Neutro Abs: 8 10*3/uL — ABNORMAL HIGH (ref 1.7–7.7)
Neutrophils Relative %: 73 %
Platelets: 147 10*3/uL — ABNORMAL LOW (ref 150–400)
RBC: 4.63 MIL/uL (ref 3.87–5.11)
RDW: 15.9 % — ABNORMAL HIGH (ref 11.5–15.5)
WBC: 11.2 10*3/uL — ABNORMAL HIGH (ref 4.0–10.5)
nRBC: 0 % (ref 0.0–0.2)

## 2018-11-01 LAB — GLUCOSE, CAPILLARY
Glucose-Capillary: 186 mg/dL — ABNORMAL HIGH (ref 70–99)
Glucose-Capillary: 126 mg/dL — ABNORMAL HIGH (ref 70–99)
Glucose-Capillary: 199 mg/dL — ABNORMAL HIGH (ref 70–99)
Glucose-Capillary: 172 mg/dL — ABNORMAL HIGH (ref 70–99)
Glucose-Capillary: 227 mg/dL — ABNORMAL HIGH (ref 70–99)

## 2018-11-01 NOTE — Progress Notes (Signed)
Nutrition Brief Note  Patient identified on the Malnutrition Screening Tool (MST) Report  Patient with no weight loss per records. Currently consuming 100% of meals.  Wt Readings from Last 15 Encounters:  10/31/18 99.7 kg  08/23/18 100.7 kg  07/26/18 97.5 kg  07/09/18 97.3 kg  06/15/18 100.2 kg  05/28/18 97.5 kg  05/15/18 99.3 kg  04/05/18 96.2 kg  03/05/18 94.3 kg  01/24/18 96.3 kg  12/29/17 93 kg  12/19/17 98.4 kg  12/15/17 98.4 kg  12/11/17 98.4 kg  10/24/17 96.2 kg    Body mass index is 44.39 kg/m. Patient meets criteria for morbid obesity based on current BMI.   Current diet order is heart healthy/CHO modified, patient is consuming approximately 100% of meals at this time. Labs and medications reviewed.   No nutrition interventions warranted at this time. If nutrition issues arise, please consult RD.   Amy Bibles, MS, RD, [place] Dietitian Pager: [phone] After Hours Pager: [phone]

## 2018-11-01 NOTE — Progress Notes (Signed)
   Ortho Hand Progress Note  Subjective: No acute events last night. Stable pain in the right elbow   Objective: Vital signs in last 24 hours: Temp:  [98.1 F (36.7 C)-98.5 F (36.9 C)] 98.1 F (36.7 C) (06/18 0438) Pulse Rate:  [72-79] 72 (06/18 0438) Resp:  [16-18] 18 (06/18 0438) BP: (114-145)/(46-68) 145/68 (06/18 0438) SpO2:  [94 %-100 %] 98 % (06/18 0438)  Intake/Output from previous day: 06/17 0701 - 06/18 0700 In: 1180 [P.O.:1080; IV Piggyback:100] Out: 1450 [Urine:1450] Intake/Output this shift: No intake/output data recorded.  Recent Labs    10/30/18 1514 10/31/18 0356 11/01/18 0335  HGB 11.7* 14.1 11.7*   Recent Labs    10/31/18 0356 11/01/18 0335  WBC 10.2 11.2*  RBC 5.68* 4.63  HCT 46.5* 37.6  PLT 145* 147*   Recent Labs    10/31/18 0356 11/01/18 0335  NA 143 142  K 3.8 4.0  CL 101 103  CO2 27 27  BUN 63* 52*  CREATININE 1.91* 1.73*  GLUCOSE 96 178*  CALCIUM 9.6 9.0   No results for input(s): LABPT, INR in the last 72 hours.  General: Alert, no acute distress Cardiovascular: No pedal edema Respiratory: No cyanosis, no use of accessory musculature Skin: No lesions in the area of chief complaint Neurologic: Sensation intact distally Psychiatric: Patient is competent for consent with normal mood and affect Lymphatic: No axillary or cervical lymphadenopathy  MUSCULOSKELETAL:Examination of the right upper extremity shows improved swelling to the posterior aspect of the elbow. There is decreased erythema to the area as well. Posterior elbow ttp improved. The prior olecranon bursa which was inflammed and tender has significantly decreased in size. She has near full range of motion of the elbow but has pain at the extremes of flexion posteriorly. There is some tenderness proximal to the olecranon over the triceps. No fluctuance noted. She has no tenderness anteriorly in the elbow. Her fingertips are all warm and well-perfused with brisk  capillary refill. Her sensation is intact light touch throughout all digits.  Assessment/Plan: 59 yo F with right elbow septic olecranon bursitis  - Hospitalist primary. Appreciate management - Continue abx treatment - She seems relatively stable on exam today compared to yesterday. Could consider MRI of the elbow to r/o additional source of infection other than the bursa if symptoms do not improve further. - No plans for operative treatment at this time - CXs from ED aspiration are NGTD   Avanell Shackleton III 11/01/2018, 8:38 AM  [phone]

## 2018-11-01 NOTE — Progress Notes (Signed)
PROGRESS NOTE    Amy [name]  GDJ:[id number] DOB: 04/22/1961 DOA: 10/30/2018 PCP: Velna Hatchet, MD    Brief Narrative:  58 year old female who presented with fever and generalized weakness.  She does have significant past medical history of diabetes mellitus type 2, chronic kidney disease stage III, diastolic heart failure and obstructive sleep apnea.  She reported right elbow pain, persistent cough worse with movement.  As an outpatient she was recently diagnosed with a respiratory illness placed on antibiotic therapy.  On her initial physical examination, blood pressure 112/63, heart rate 70, respiratory rate 17, temperature 98.3, oxygen saturation 99%.  Was mucous membranes, lungs clear to auscultation bilaterally, heart S1-2 present rhythmic, abdomen soft nontender, 1+ pretibial bilateral edema, right elbow joint with erythema, increased local temperature and edema.  Sodium 144, potassium 3.7, chloride 102, bicarb 39, glucose 128, BUN 67, creatinine 2.24, white count 16.9, hemoglobin 9.7, hematocrit 37.8, platelets 148.  SARS COVID-19 was negative.  Urinalysis negative for infection.  Chest radiograph with bilateral increase vascular lung markings, no infiltrates.  Right elbow x-rays with no fractures. EKG with 70 bpm, normal axis, normal intervals, sinus rhythm with normal conduction, no ST segment or T wave changes.  Patient was admitted to the hospital working diagnosis right olecranon bursitis.    Assessment & Plan:   Principal Problem:   Septic bursitis of elbow, right Active Problems:   Asthma   Insulin dependent diabetes mellitus with complications (HCC)   Essential hypertension   Chronic diastolic heart failure (HCC)   Obstructive sleep apnea on CPAP   CKD (chronic kidney disease), stage III ([place])   1. Right elbow infective bursitis. wbc stable at 11,2, patient has been afebrile, but continue to have pain at the right elbow joint and distal arm, worse with movement,  continue to be tender to palpation, erythematous and indurated. Her cultures continue to be no growth, will continue IV antibiotic therapy with ceftriaxone, plan to do MRI if worsening symptoms per orthopedic recommendations.    2. HTN. Stable blood pressure with systolic 622 mmHg, continue to hold on antihypertensive agents.   3. AKI on CKD stage 3. Serum cr trending down to 1,73 from 1,91, K at 4,0 and serum bicarbonate at 27, will continue to follow on renal function and electrolytes, avoid hypotension and nephrotoxic medications.  4. Asthma.  No clinical signs of acute exacerbation.   5. Diastolic heart failure. No signs of exacerbation. Continue blood pressure control.   6. Obesity. Calculated BMI is 44,3.   7. T2DM. Fasting glucose this am at 178, continue reduced dose of long acting insulin and insulin sliding scale for glucose cover and monitoring. Will check Hgb a1c.   8. Hypothyroid. On levothyroxine.   DVT prophylaxis: enoxaparin   Code Status: full Family Communication: no family at the bedside  Disposition Plan/ discharge barriers: pending clinical improvement.   Body mass index is 44.39 kg/m. Malnutrition Type:      Malnutrition Characteristics:      Nutrition Interventions:     RN Pressure Injury Documentation:     Consultants:   orthopedics  Procedures:     Antimicrobials:       Subjective: Patient continue to have right elbow pain, proximal to the joint, moderate intensity, associated with edema, and worse with movement.   Objective: Vitals:   10/31/18 1353 10/31/18 2043 10/31/18 2238 11/01/18 0438  BP: (!) 114/46 134/64  (!) 145/68  Pulse: 76 79 74 72  Resp: 16 18 18  18  Temp: 98.5 F (36.9 C) 98.2 F (36.8 C)  98.1 F (36.7 C)  TempSrc: Oral Oral  Oral  SpO2: 100% 96% 94% 98%  Weight:        Intake/Output Summary (Last 24 hours) at 11/01/2018 1035 Last data filed at 11/01/2018 1000 Gross per 24 hour  Intake 1540 ml   Output 950 ml  Net 590 ml   Filed Weights   10/31/18 0812  Weight: 99.7 kg    Examination:   General: deconditioned  Neurology: Awake and alert, non focal  E ENT: mild pallor, no icterus, oral mucosa moist Cardiovascular: No JVD. S1-S2 present, rhythmic, no gallops, rubs, or murmurs. No lower extremity edema. Pulmonary: positive breath sounds bilaterally, adequate air movement, no wheezing, rhonchi or rales. Gastrointestinal. Abdomen with no organomegaly, non tender, no rebound or guarding Skin. No rashes Musculoskeletal: no joint deformities     Data Reviewed: I have personally reviewed following labs and imaging studies  CBC: Recent Labs  Lab 10/30/18 1514 10/31/18 0356 11/01/18 0335  WBC 16.9* 10.2 11.2*  NEUTROABS  --  7.0 8.0*  HGB 11.7* 14.1 11.7*  HCT 37.8 46.5* 37.6  MCV 80.9 81.9 81.2  PLT 148* 145* 160*   Basic Metabolic Panel: Recent Labs  Lab 10/30/18 1514 10/31/18 0356 11/01/18 0335  NA 144 143 142  K 3.7 3.8 4.0  CL 102 101 103  CO2 29 27 27   GLUCOSE 128* 96 178*  BUN 67* 63* 52*  CREATININE 2.24* 1.91* 1.73*  CALCIUM 9.2 9.6 9.0   GFR: Estimated Creatinine Clearance: 36.4 mL/min (A) (by C-G formula based on SCr of 1.73 mg/dL (H)). Liver Function Tests: Recent Labs  Lab 10/30/18 1515  AST 28  ALT 30  ALKPHOS 112  BILITOT 0.4  PROT 7.3  ALBUMIN 3.2*   No results for input(s): LIPASE, AMYLASE in the last 168 hours. No results for input(s): AMMONIA in the last 168 hours. Coagulation Profile: No results for input(s): INR, PROTIME in the last 168 hours. Cardiac Enzymes: No results for input(s): CKTOTAL, CKMB, CKMBINDEX, TROPONINI in the last 168 hours. BNP (last 3 results) Recent Labs    07/26/18 1058  PROBNP 120   HbA1C: No results for input(s): HGBA1C in the last 72 hours. CBG: Recent Labs  Lab 10/31/18 1659 10/31/18 2007 10/31/18 2336 11/01/18 0348 11/01/18 0803  GLUCAP 192* 206* 195* 186* 126*   Lipid Profile: No  results for input(s): CHOL, HDL, LDLCALC, TRIG, CHOLHDL, LDLDIRECT in the last 72 hours. Thyroid Function Tests: No results for input(s): TSH, T4TOTAL, FREET4, T3FREE, THYROIDAB in the last 72 hours. Anemia Panel: No results for input(s): VITAMINB12, FOLATE, FERRITIN, TIBC, IRON, RETICCTPCT in the last 72 hours.    Radiology Studies: I have reviewed all of the imaging during this hospital visit personally     Scheduled Meds: . insulin aspart  0-9 Units Subcutaneous Q4H  . insulin glargine  10 Units Subcutaneous QHS  . levothyroxine  150 mcg Oral QAC breakfast  . pantoprazole  40 mg Oral Daily  . pregabalin  75 mg Oral BID  . simvastatin  20 mg Oral q1800  . sodium chloride flush  3 mL Intravenous Q12H   Continuous Infusions: . sodium chloride    . cefTRIAXone (ROCEPHIN)  IV 1 g (10/31/18 1321)     LOS: 1 day         Gerome Apley, MD

## 2018-11-02 ENCOUNTER — Inpatient Hospital Stay (HOSPITAL_COMMUNITY): Payer: Medicare Other

## 2018-11-02 ENCOUNTER — Other Ambulatory Visit: Payer: Self-pay

## 2018-11-02 LAB — BODY FLUID CULTURE
Culture: NO GROWTH
Gram Stain: NONE SEEN

## 2018-11-02 LAB — CBC WITH DIFFERENTIAL/PLATELET
Abs Immature Granulocytes: 0.04 10*3/uL (ref 0.00–0.07)
Basophils Absolute: 0 10*3/uL (ref 0.0–0.1)
Basophils Relative: 0 %
Eosinophils Absolute: 0.1 10*3/uL (ref 0.0–0.5)
Eosinophils Relative: 1 %
HCT: 38.7 % (ref 36.0–46.0)
Hemoglobin: 12.1 g/dL (ref 12.0–15.0)
Immature Granulocytes: 0 %
Lymphocytes Relative: 11 %
Lymphs Abs: 1.5 10*3/uL (ref 0.7–4.0)
MCH: 25.5 pg — ABNORMAL LOW (ref 26.0–34.0)
MCHC: 31.3 g/dL (ref 30.0–36.0)
MCV: 81.5 fL (ref 80.0–100.0)
Monocytes Absolute: 1.9 10*3/uL — ABNORMAL HIGH (ref 0.1–1.0)
Monocytes Relative: 14 %
Neutro Abs: 9.7 10*3/uL — ABNORMAL HIGH (ref 1.7–7.7)
Neutrophils Relative %: 74 %
Platelets: 150 10*3/uL (ref 150–400)
RBC: 4.75 MIL/uL (ref 3.87–5.11)
RDW: 15.7 % — ABNORMAL HIGH (ref 11.5–15.5)
WBC: 13.3 10*3/uL — ABNORMAL HIGH (ref 4.0–10.5)
nRBC: 0 % (ref 0.0–0.2)

## 2018-11-02 LAB — BASIC METABOLIC PANEL
Anion gap: 13 (ref 5–15)
BUN: 56 mg/dL — ABNORMAL HIGH (ref 6–20)
CO2: 25 mmol/L (ref 22–32)
Calcium: 8.9 mg/dL (ref 8.9–10.3)
Chloride: 104 mmol/L (ref 98–111)
Creatinine, Ser: 1.98 mg/dL — ABNORMAL HIGH (ref 0.44–1.00)
GFR calc Af Amer: 31 mL/min — ABNORMAL LOW (ref >60)
GFR calc non Af Amer: 27 mL/min — ABNORMAL LOW (ref >60)
Glucose, Bld: 229 mg/dL — ABNORMAL HIGH (ref 70–99)
Potassium: 4 mmol/L (ref 3.5–5.1)
Sodium: 142 mmol/L (ref 135–145)

## 2018-11-02 LAB — GLUCOSE, CAPILLARY
Glucose-Capillary: 172 mg/dL — ABNORMAL HIGH (ref 70–99)
Glucose-Capillary: 226 mg/dL — ABNORMAL HIGH (ref 70–99)
Glucose-Capillary: 227 mg/dL — ABNORMAL HIGH (ref 70–99)
Glucose-Capillary: 233 mg/dL — ABNORMAL HIGH (ref 70–99)
Glucose-Capillary: 236 mg/dL — ABNORMAL HIGH (ref 70–99)
Glucose-Capillary: 273 mg/dL — ABNORMAL HIGH (ref 70–99)

## 2018-11-02 LAB — HEMOGLOBIN A1C
Hgb A1c MFr Bld: 10.7 % — ABNORMAL HIGH (ref 4.8–5.6)
Mean Plasma Glucose: 260.39 mg/dL

## 2018-11-02 MED ORDER — GUAIFENESIN-DM 100-10 MG/5ML PO SYRP
5.0000 mL | ORAL_SOLUTION | ORAL | Status: DC | PRN
  Administered 2018-11-02 (×2): 5 mL via ORAL
  Filled 2018-11-02 (×2): qty 10

## 2018-11-02 MED ORDER — LORAZEPAM 1 MG PO TABS
1.0000 mg | ORAL_TABLET | Freq: Once | ORAL | Status: AC | PRN
  Administered 2018-11-02: 1 mg via ORAL
  Filled 2018-11-02: qty 1

## 2018-11-02 MED ORDER — SODIUM CHLORIDE 0.9 % IV SOLN
INTRAVENOUS | Status: DC
  Administered 2018-11-03 – 2018-11-04 (×2): via INTRAVENOUS

## 2018-11-02 MED ORDER — PROMETHAZINE HCL 25 MG/ML IJ SOLN
12.5000 mg | Freq: Four times a day (QID) | INTRAMUSCULAR | Status: DC | PRN
  Administered 2018-11-05: 12.5 mg via INTRAVENOUS
  Filled 2018-11-02 (×2): qty 1

## 2018-11-02 NOTE — Care Management Important Message (Signed)
Important Message  Patient Details  Name: AMAIRA SAFLEY MRN: [id number] Date of Birth: [birth year]   Medicare Important Message Given:  Yes. CMA printed out IM for the Case Manager or LCSW to give to the patient.      Hina Gupta 11/02/2018, 8:41 AM

## 2018-11-02 NOTE — Progress Notes (Signed)
I reviewed CT images and report. There is no additional drainable fluid on the CT other than the thickened olecranon bursa. Recommend continued antibiotics. No surgery planned. OK for d/c from ortho standpoint. Follow up with me in 1 week

## 2018-11-02 NOTE — Progress Notes (Signed)
Inpatient Diabetes Program Recommendations  AACE/ADA: New Consensus Statement on Inpatient Glycemic Control (2015)  Target Ranges:  Prepandial:   less than 140 mg/dL      Peak postprandial:   less than 180 mg/dL (1-2 hours)      Critically ill patients:  140 - 180 mg/dL   Lab Results  Component Value Date   GLUCAP 273 (H) 11/02/2018   HGBA1C 10.7 (H) 11/02/2018    Review of Glycemic Control  Diabetes history: DM2 Outpatient Diabetes medications: Humalog 12-23-12 units tidwc, Tresiba 60 units QHS Current orders for Inpatient glycemic control: Lantus 10 units QHS, Novolog 0-9 units Q4H.  On CHO mod diet HgbA1C - 10.7% FBS looks great. Post-prandials elevated.  Inpatient Diabetes Program Recommendations:     Increase Lantus to 15 units QHS Increase Novolog to 0-15 units tidwc and hs Add meal coverage insulin - Novolog 6 units tidwc if pt eats > 50% meal  Tried to speak with pt regarding her HgbA1C of 10.7% but not in room. Did not answer phone this afternoon. Will speak with pt on 6/22 about her diabetes management at home.  Will continue to follow.  Thank you. [name], RD, LDN, CDE Inpatient Diabetes Coordinator [phone]

## 2018-11-02 NOTE — Plan of Care (Signed)

## 2018-11-02 NOTE — Progress Notes (Signed)
Pt unable to preform MRI of the elbow due to large body habitus. Pt is unable to raise arm above her head and too large to obtain elbow scan by her side in a closed bore scanner. MRI was attempted. Pt also in severe pain when laying on a flat surface.

## 2018-11-02 NOTE — Progress Notes (Signed)
Pt has declined CPAP QHS at this time.  Machine remains at bedside if pt decides to wear.  RT to monitor and assess as needed.

## 2018-11-02 NOTE — Progress Notes (Signed)
   Ortho Hand Progress Note  Subjective: No acute events last night. Continues to have pain in the posterior right elbow that is not significantly better.   Objective: Vital signs in last 24 hours: Temp:  [98 F (36.7 C)-99.3 F (37.4 C)] 99.3 F (37.4 C) (06/19 0612) Pulse Rate:  [71-76] 71 (06/19 0612) Resp:  [16-18] 16 (06/19 0612) BP: (131-144)/(57-73) 131/57 (06/19 0612) SpO2:  [95 %-99 %] 95 % (06/19 0612) Weight:  [100.1 kg] 100.1 kg (06/19 0612)  Intake/Output from previous day: 06/18 0701 - 06/19 0700 In: 1180 [P.O.:1080; IV Piggyback:100] Out: 1200 [Urine:1200] Intake/Output this shift: No intake/output data recorded.  Recent Labs    10/30/18 1514 10/31/18 0356 11/01/18 0335 11/02/18 0340  HGB 11.7* 14.1 11.7* 12.1   Recent Labs    11/01/18 0335 11/02/18 0340  WBC 11.2* 13.3*  RBC 4.63 4.75  HCT 37.6 38.7  PLT 147* 150   Recent Labs    11/01/18 0335 11/02/18 0340  NA 142 142  K 4.0 4.0  CL 103 104  CO2 27 25  BUN 52* 56*  CREATININE 1.73* 1.98*  GLUCOSE 178* 229*  CALCIUM 9.0 8.9   No results for input(s): LABPT, INR in the last 72 hours.  General: Alert, no acute distress Cardiovascular: No pedal edema Respiratory: No cyanosis, no use of accessory musculature Skin: No lesions in the area of chief complaint Neurologic: Sensation intact distally Psychiatric: Patient is competent for consent with normal mood and affect Lymphatic: No axillary or cervical lymphadenopathy  MUSCULOSKELETAL:Examination of the right upper extremity showsstableswelling to the posterior aspect of the elbow. There isstableerythema to the area as well. The area remains warm to the touch. The prior olecranon bursa which was inflammed and tender has significantly decreased in size. She has near full range of motion of the elbow but has pain at the extremes of flexion posteriorly. No pain with passive elbow ROM. There is some tenderness proximal to the olecranon  over the triceps. No fluctuance noted. She has no tenderness anteriorly in the elbow. Her fingertips are all warm and well-perfused with brisk capillary refill. Her sensation is intact light touch throughout all digits.  Assessment/Plan: 58 yo F with right elbow septic olecranon bursitis  - Hospitalist primary. Appreciate management - Continue abx treatment - No significant improvement since exam yesterday. Remains tender over the distal triceps tendon/muscle. - MRI R elbow ordered - No plans for operative treatment at this time - CXs from ED aspiration are NGTD    Avanell Shackleton III 11/02/2018, 7:24 AM  [phone]

## 2018-11-02 NOTE — Progress Notes (Signed)
PROGRESS NOTE    Amy [name]  EGB:[id number] DOB: 05-25-1960 DOA: 10/30/2018 PCP: Velna Hatchet, MD    Brief Narrative:  58 year old female who presented with fever and generalized weakness. She does have significant past medical history of diabetes mellitus type 2, chronic kidney disease stage III, diastolic heart failure and obstructive sleep apnea. She reported right elbow pain, persistent cough worse with movement. As an outpatient she was recently diagnosed with a respiratory illness placed on antibiotic therapy.On her initial physical examination, blood pressure 112/63, heart rate 70, respiratory rate17, temperature 98.3, oxygen saturation 99%.Was mucous membranes, lungs clear to auscultation bilaterally, heart S1-2 present rhythmic, abdomen soft nontender, 1+ pretibial bilateral edema, right elbow joint with erythema, increased local temperature and edema. Sodium 144, potassium 3.7, chloride 102, bicarb 39, glucose 128, BUN 67, creatinine 2.24, white count 16.9, hemoglobin 9.7, hematocrit 37.8, platelets 148.SARS COVID-19 was negative. Urinalysis negative for infection.Chest radiograph with bilateral increase vascular lung markings,no infiltrates. Right elbow x-rays with no fractures.EKG with70 bpm, normal axis, normal intervals, sinus rhythm with normal conduction, no ST segment or T wave changes.  Patient was admitted to the hospital working diagnosis right olecranon bursitis.    Assessment & Plan:   Principal Problem:   Septic bursitis of elbow, right Active Problems:   Asthma   Insulin dependent diabetes mellitus with complications (HCC)   Essential hypertension   Chronic diastolic heart failure (HCC)   Obstructive sleep apnea on CPAP   CKD (chronic kidney disease), stage III ([place])  1. Right elbow infective bursitis.patient with worsening leukocytosis, now up to 13,3 with persistent pain and edema at her right elbow, will proceed with further imaging  with MRI per surgery recommendations. Her cultures continue to be no growth, will keep antibiotic therapy with IV ceftriaxone for now. Follow orthopedic recommendations.   2. HTN. Stable blood pressure with systolic 371 mmHg. Holding blood pressure agents.  3.AKI onCKD stage 3. Serum cr up to 1,98 with K at 4,0 and serum bicarbonate at 25. Will continue to follow renal function closely, avoid hypotension or nephrotoxic medications, will add gentle hydration with saline at 50 ml per H. Continue antiemetics and antiacids.   4. Asthma. No acute exacerbation.  5. Diastolic heart failure. Clinically hypovolemic, will add gentle hydration with saline, continue blood pressure monitoring.   6. Obesity with dyslipidemia. BMI is 44,3. Continue statin therapy.   7. T2DM with hyperglycemia. Fasting glucose is 229, will continue insulin sliding scale for glucose cover and monitoring. 10 units qhs of lantus.   8. Hypothyroid. Continue with levothyroxine.   DVT prophylaxis:enoxaparin  Code Status:full Family Communication:no family at the bedside Disposition Plan/ discharge barriers:pending clinical improvement.   Body mass index is 44.57 kg/m. Malnutrition Type:      Malnutrition Characteristics:      Nutrition Interventions:     RN Pressure Injury Documentation:     Consultants:   orthopedics  Procedures:   Right elbow joint aspiration   Antimicrobials:   Ceftriaxone     Subjective: Patient continue to have right elbow pain, moderate to severe, worse with movement, radiated into the distal arm, now associated with nausea. No chest pain.   Objective: Vitals:   11/01/18 1334 11/01/18 2010 11/01/18 2239 11/02/18 0612  BP: (!) 144/73  (!) 132/58 (!) 131/57  Pulse: 76  73 71  Resp: 18 18 16 16   Temp: 98 F (36.7 C)  99.1 F (37.3 C) 99.3 F (37.4 C)  TempSrc: Oral  Oral Oral  SpO2: 99%  97% 95%  Weight:    100.1 kg    Intake/Output Summary  (Last 24 hours) at 11/02/2018 1248 Last data filed at 11/02/2018 1000 Gross per 24 hour  Intake 820 ml  Output 850 ml  Net -30 ml   Filed Weights   10/31/18 0812 11/02/18 0612  Weight: 99.7 kg 100.1 kg    Examination:   General: deconditioned, nauseated.  Neurology: Awake and alert, non focal  E ENT: mild pallor, no icterus, oral mucosa moist Cardiovascular: No JVD. S1-S2 present, rhythmic, no gallops, rubs, or murmurs. No lower extremity edema. Pulmonary: positive breath sounds bilaterally, adequate air movement, no wheezing, rhonchi or rales. Gastrointestinal. Abdomen with, no organomegaly, non tender, no rebound or guarding Skin. No rashes Musculoskeletal: right elbow with erythema, edema and induration, decrease range of motion due to pain.      Data Reviewed: I have personally reviewed following labs and imaging studies  CBC: Recent Labs  Lab 10/30/18 1514 10/31/18 0356 11/01/18 0335 11/02/18 0340  WBC 16.9* 10.2 11.2* 13.3*  NEUTROABS  --  7.0 8.0* 9.7*  HGB 11.7* 14.1 11.7* 12.1  HCT 37.8 46.5* 37.6 38.7  MCV 80.9 81.9 81.2 81.5  PLT 148* 145* 147* 664   Basic Metabolic Panel: Recent Labs  Lab 10/30/18 1514 10/31/18 0356 11/01/18 0335 11/02/18 0340  NA 144 143 142 142  K 3.7 3.8 4.0 4.0  CL 102 101 103 104  CO2 29 27 27 25   GLUCOSE 128* 96 178* 229*  BUN 67* 63* 52* 56*  CREATININE 2.24* 1.91* 1.73* 1.98*  CALCIUM 9.2 9.6 9.0 8.9   GFR: Estimated Creatinine Clearance: 31.9 mL/min (A) (by C-G formula based on SCr of 1.98 mg/dL (H)). Liver Function Tests: Recent Labs  Lab 10/30/18 1515  AST 28  ALT 30  ALKPHOS 112  BILITOT 0.4  PROT 7.3  ALBUMIN 3.2*   No results for input(s): LIPASE, AMYLASE in the last 168 hours. No results for input(s): AMMONIA in the last 168 hours. Coagulation Profile: No results for input(s): INR, PROTIME in the last 168 hours. Cardiac Enzymes: No results for input(s): CKTOTAL, CKMB, CKMBINDEX, TROPONINI in the last  168 hours. BNP (last 3 results) Recent Labs    07/26/18 1058  PROBNP 120   HbA1C: Recent Labs    11/02/18 0340  HGBA1C 10.7*   CBG: Recent Labs  Lab 11/01/18 1930 11/02/18 0045 11/02/18 0401 11/02/18 0739 11/02/18 1159  GLUCAP 227* 233* 227* 172* 226*   Lipid Profile: No results for input(s): CHOL, HDL, LDLCALC, TRIG, CHOLHDL, LDLDIRECT in the last 72 hours. Thyroid Function Tests: No results for input(s): TSH, T4TOTAL, FREET4, T3FREE, THYROIDAB in the last 72 hours. Anemia Panel: No results for input(s): VITAMINB12, FOLATE, FERRITIN, TIBC, IRON, RETICCTPCT in the last 72 hours.    Radiology Studies: I have reviewed all of the imaging during this hospital visit personally     Scheduled Meds: . insulin aspart  0-9 Units Subcutaneous Q4H  . insulin glargine  10 Units Subcutaneous QHS  . levothyroxine  150 mcg Oral QAC breakfast  . pantoprazole  40 mg Oral Daily  . pregabalin  75 mg Oral BID  . simvastatin  20 mg Oral q1800  . sodium chloride flush  3 mL Intravenous Q12H   Continuous Infusions: . sodium chloride    . cefTRIAXone (ROCEPHIN)  IV 1 g (11/01/18 1321)     LOS: 2 days        Mauricio Gerome Apley, MD

## 2018-11-03 LAB — CBC WITH DIFFERENTIAL/PLATELET
Abs Immature Granulocytes: 0.11 10*3/uL — ABNORMAL HIGH (ref 0.00–0.07)
Basophils Absolute: 0.1 10*3/uL (ref 0.0–0.1)
Basophils Relative: 0 %
Eosinophils Absolute: 0.2 10*3/uL (ref 0.0–0.5)
Eosinophils Relative: 1 %
HCT: 37.6 % (ref 36.0–46.0)
Hemoglobin: 11.3 g/dL — ABNORMAL LOW (ref 12.0–15.0)
Immature Granulocytes: 1 %
Lymphocytes Relative: 10 %
Lymphs Abs: 1.3 10*3/uL (ref 0.7–4.0)
MCH: 24.7 pg — ABNORMAL LOW (ref 26.0–34.0)
MCHC: 30.1 g/dL (ref 30.0–36.0)
MCV: 82.3 fL (ref 80.0–100.0)
Monocytes Absolute: 1.7 10*3/uL — ABNORMAL HIGH (ref 0.1–1.0)
Monocytes Relative: 13 %
Neutro Abs: 10.1 10*3/uL — ABNORMAL HIGH (ref 1.7–7.7)
Neutrophils Relative %: 75 %
Platelets: 156 10*3/uL (ref 150–400)
RBC: 4.57 MIL/uL (ref 3.87–5.11)
RDW: 15.7 % — ABNORMAL HIGH (ref 11.5–15.5)
WBC: 13.4 10*3/uL — ABNORMAL HIGH (ref 4.0–10.5)
nRBC: 0 % (ref 0.0–0.2)

## 2018-11-03 LAB — GLUCOSE, CAPILLARY
Glucose-Capillary: 124 mg/dL — ABNORMAL HIGH (ref 70–99)
Glucose-Capillary: 151 mg/dL — ABNORMAL HIGH (ref 70–99)
Glucose-Capillary: 174 mg/dL — ABNORMAL HIGH (ref 70–99)
Glucose-Capillary: 188 mg/dL — ABNORMAL HIGH (ref 70–99)
Glucose-Capillary: 192 mg/dL — ABNORMAL HIGH (ref 70–99)
Glucose-Capillary: 216 mg/dL — ABNORMAL HIGH (ref 70–99)

## 2018-11-03 LAB — BASIC METABOLIC PANEL
Anion gap: 13 (ref 5–15)
BUN: 48 mg/dL — ABNORMAL HIGH (ref 6–20)
CO2: 25 mmol/L (ref 22–32)
Calcium: 8.9 mg/dL (ref 8.9–10.3)
Chloride: 106 mmol/L (ref 98–111)
Creatinine, Ser: 1.64 mg/dL — ABNORMAL HIGH (ref 0.44–1.00)
GFR calc Af Amer: 39 mL/min — ABNORMAL LOW (ref >60)
GFR calc non Af Amer: 34 mL/min — ABNORMAL LOW (ref >60)
Glucose, Bld: 158 mg/dL — ABNORMAL HIGH (ref 70–99)
Potassium: 4.1 mmol/L (ref 3.5–5.1)
Sodium: 144 mmol/L (ref 135–145)

## 2018-11-03 MED ORDER — VANCOMYCIN HCL IN DEXTROSE 1-5 GM/200ML-% IV SOLN
1000.0000 mg | INTRAVENOUS | Status: DC
  Administered 2018-11-04: 1000 mg via INTRAVENOUS
  Filled 2018-11-03: qty 200

## 2018-11-03 MED ORDER — GABAPENTIN 300 MG PO CAPS
300.0000 mg | ORAL_CAPSULE | Freq: Three times a day (TID) | ORAL | Status: DC
  Administered 2018-11-03 – 2018-11-05 (×7): 300 mg via ORAL
  Filled 2018-11-03 (×7): qty 1

## 2018-11-03 MED ORDER — VANCOMYCIN HCL 10 G IV SOLR
2000.0000 mg | INTRAVENOUS | Status: AC
  Administered 2018-11-03: 2000 mg via INTRAVENOUS
  Filled 2018-11-03: qty 2000

## 2018-11-03 NOTE — Progress Notes (Addendum)
Pharmacy Antibiotic Note  Amy [name] is a 58 y.o. female admitted on 10/30/2018 with cellulitis.  Pharmacy was consulted for vancomycin dosing on 6/16.  Pt with R olecranon bursitis, CKD stage III-IV. Weight on 08/23/18 was 100.7 kg.  Patient received Vancomycin 2gm on 6/16.  Vancomycin d/c'ed and Ceftriaxone 1gm IV q24h started on 6/17.  Ceftriaxone d/c'ed today and pharmacy consulted to resume Vancomycin dosing.   11/03/18  T = 100.5  WBC trending back up (today = 13.4)  SCr trending down (today = 1.64 with CrCl ~ 39 ml/min)  Plan: vancomcycin 2 gm loading dose IV x 1 Vancomycin 1000 mg IV Q 36 hrs. Goal AUC 400-550. Expected AUC: 507.6  Est peak 32.2/est trough 13.3 SCr used: 1.64  Vd 0.5. Wt 103.8 kg F/u renal fxn, culture data,   vanc levels as needed     Height: 4\' 11"  (149.9 cm) Weight: 228 lb 13.4 oz (103.8 kg) IBW/kg (Calculated) : 43.2  Temp (24hrs), Avg:99.5 F (37.5 C), Min:98.7 F (37.1 C), Max:100.5 F (38.1 C)  Recent Labs  Lab 10/30/18 1514 10/30/18 1713 10/31/18 0356 11/01/18 0335 11/02/18 0340 11/03/18 0434  WBC 16.9*  --  10.2 11.2* 13.3* 13.4*  CREATININE 2.24*  --  1.91* 1.73* 1.98* 1.64*  LATICACIDVEN  --  1.3  --   --   --   --     Estimated Creatinine Clearance: 39.3 mL/min (A) (by C-G formula based on SCr of 1.64 mg/dL (H)).    Allergies  Allergen Reactions  . Penicillins Hives and Swelling    Has patient had a PCN reaction causing immediate rash, facial/tongue/throat swelling, SOB or lightheadedness with hypotension: yes- face swelling Has patient had a PCN reaction causing severe rash involving mucus membranes or skin necrosis: no Has patient had a PCN reaction that required hospitalization unknown (childhood allergy) Has patient had a PCN reaction occurring within the last 10 years: no If all of the above answers are "NO", then may proceed with Cephalosporin use.   . Codeine Nausea Only  . Iodine-131 Hives  . Iohexol Hives    Pt  developed itching and hives along with nasal congestion; needs 13 hour premeds for future studies, Onset Date: [id number]   . Lisinopril Cough  . Sulfa Antibiotics Nausea And Vomiting    Antimicrobials this admission: 6/16 vanc>> 6/17; resumed 6/20 >> 6/17 ceftriaxone >> 6/20 Dose adjustments this admission:  Microbiology results: 6/16 synovial bursa/cyst: NG (final) 6/16 BCx2: NG to date 6/16 COVID 19 neg  Thank you for allowing pharmacy to be a part of this patient's care.  Leone Haven, PharmD 11/03/2018 11:04 AM

## 2018-11-03 NOTE — Progress Notes (Signed)
PROGRESS NOTE    ADRIANNA DUDAS  YOV:[id number] DOB: 1961-01-08 DOA: 10/30/2018 PCP: Velna Hatchet, MD    Brief Narrative:  58 year old female who presented with fever and generalized weakness. She does have significant past medical history of diabetes mellitus type 2, chronic kidney disease stage III, diastolic heart failure and obstructive sleep apnea. She reported right elbow pain, persistent cough worse with movement. As an outpatient she was recently diagnosed with a respiratory illness placed on antibiotic therapy.On her initial physical examination, blood pressure 112/63, heart rate 70, respiratory rate17, temperature 98.3, oxygen saturation 99%.Was mucous membranes, lungs clear to auscultation bilaterally, heart S1-2 present rhythmic, abdomen soft nontender, 1+ pretibial bilateral edema, right elbow joint with erythema, increased local temperature and edema. Sodium 144, potassium 3.7, chloride 102, bicarb 39, glucose 128, BUN 67, creatinine 2.24, white count 16.9, hemoglobin 9.7, hematocrit 37.8, platelets 148.SARS COVID-19 was negative. Urinalysis negative for infection.Chest radiograph with bilateral increase vascular lung markings,no infiltrates. Right elbow x-rays with no fractures.EKG with70 bpm, normal axis, normal intervals, sinus rhythm with normal conduction, no ST segment or T wave changes.  Patient was admitted to the hospital working diagnosis right olecranon bursitis.   Assessment & Plan:   Principal Problem:   Septic bursitis of elbow, right Active Problems:   Asthma   Insulin dependent diabetes mellitus with complications (HCC)   Essential hypertension   Chronic diastolic heart failure (HCC)   Obstructive sleep apnea on CPAP   CKD (chronic kidney disease), stage III ([place])   1. Right elbow infective bursitis.CT of the elbow with marked soft tissue thickening over the olecranon region, bursitis. Her wbc continue to be elevated at 13, positive  tenderness, edema, erythema, increase local temperature at the right elbow. Will change antibiotic therapy to cover presumptive MRSA. Will continue pain control, follow cell count and temperature curve. No surgical intervention per orthopedics.   2. HTN.Continue to hold on antihypertensive agents, blood pressure 741 mmHg systolic.   3.AKI onCKD stage 3.renal function with serum cr at 1,64 with k at 4,1 and serum bicarbonate at 25, will follow on renal panel in am. Vancomycin per pharmacy protocol.   4. Asthma. Stable with no signs of  acute exacerbation.  5. Diastolic heart failure.Continue gentle hydration with saline, no signs of exacerbation.  6. Obesity with dyslipidemia.continue statin therapy, BMI is 44,3.   7. T2DM with hyperglycemia. Fasting glucose is 158. On insulin sliding scale for glucose cover and monitoring, basal insulin with 10 units qhs.  8. Hypothyroid. Onlevothyroxine.   DVT prophylaxis:enoxaparin  Code Status:full Family Communication:no family at the bedside Disposition Plan/ discharge barriers:pending clinical improvement.   Body mass index is 46.22 kg/m. Malnutrition Type:      Malnutrition Characteristics:      Nutrition Interventions:     RN Pressure Injury Documentation:     Consultants:   Orthopedics    Procedures:   Right elbow joint aspiration   Antimicrobials:   Ceftriaxone 6/16 >< 6/20  Vancomycin 6/20 >    Subjective: Patient continue to have significant pain on her right elbow, decreased range of motion, positive fever. No nausea or vomiting, no chest pain or dyspnea. Feet with positive burning sensation, radiated to the knees and ankles.   Objective: Vitals:   11/02/18 1443 11/02/18 1500 11/02/18 2126 11/03/18 0424  BP: 126/66  (!) 115/54 (!) 122/54  Pulse: 70  77 89  Resp: 16  16 17   Temp: 98.7 F (37.1 C)  99.4 F (37.4 C) (!) 100.5  F (38.1 C)  TempSrc: Oral  Oral Oral  SpO2: 98%   90% 90%  Weight:  100.1 kg  103.8 kg  Height:  4\' 11"  (1.499 m)      Intake/Output Summary (Last 24 hours) at 11/03/2018 0941 Last data filed at 11/03/2018 0650 Gross per 24 hour  Intake 1224.7 ml  Output 200 ml  Net 1024.7 ml   Filed Weights   11/02/18 0612 11/02/18 1500 11/03/18 0424  Weight: 100.1 kg 100.1 kg 103.8 kg    Examination:   General: deconditioned and ill looking appearing  Neurology: Awake and alert, non focal  E ENT: no pallor, no icterus, oral mucosa moist Cardiovascular: No JVD. S1-S2 present, rhythmic, no gallops, rubs, or murmurs. No lower extremity edema. Pulmonary: positive breath sounds bilaterally, adequate air movement, no wheezing, rhonchi or rales. Gastrointestinal. Abdomen with, no organomegaly, non tender, no rebound or guarding Skin. No rashes Musculoskeletal: right elbow tender to palpation, increased local temperature and induration, decreased range of motion.      Data Reviewed: I have personally reviewed following labs and imaging studies  CBC: Recent Labs  Lab 10/30/18 1514 10/31/18 0356 11/01/18 0335 11/02/18 0340 11/03/18 0434  WBC 16.9* 10.2 11.2* 13.3* 13.4*  NEUTROABS  --  7.0 8.0* 9.7* 10.1*  HGB 11.7* 14.1 11.7* 12.1 11.3*  HCT 37.8 46.5* 37.6 38.7 37.6  MCV 80.9 81.9 81.2 81.5 82.3  PLT 148* 145* 147* 150 213   Basic Metabolic Panel: Recent Labs  Lab 10/30/18 1514 10/31/18 0356 11/01/18 0335 11/02/18 0340 11/03/18 0434  NA 144 143 142 142 144  K 3.7 3.8 4.0 4.0 4.1  CL 102 101 103 104 106  CO2 29 27 27 25 25   GLUCOSE 128* 96 178* 229* 158*  BUN 67* 63* 52* 56* 48*  CREATININE 2.24* 1.91* 1.73* 1.98* 1.64*  CALCIUM 9.2 9.6 9.0 8.9 8.9   GFR: Estimated Creatinine Clearance: 39.3 mL/min (A) (by C-G formula based on SCr of 1.64 mg/dL (H)). Liver Function Tests: Recent Labs  Lab 10/30/18 1515  AST 28  ALT 30  ALKPHOS 112  BILITOT 0.4  PROT 7.3  ALBUMIN 3.2*   No results for input(s): LIPASE, AMYLASE in  the last 168 hours. No results for input(s): AMMONIA in the last 168 hours. Coagulation Profile: No results for input(s): INR, PROTIME in the last 168 hours. Cardiac Enzymes: No results for input(s): CKTOTAL, CKMB, CKMBINDEX, TROPONINI in the last 168 hours. BNP (last 3 results) Recent Labs    07/26/18 1058  PROBNP 120   HbA1C: Recent Labs    11/02/18 0340  HGBA1C 10.7*   CBG: Recent Labs  Lab 11/02/18 1627 11/02/18 2123 11/03/18 0020 11/03/18 0421 11/03/18 0744  GLUCAP 273* 236* 174* 151* 124*   Lipid Profile: No results for input(s): CHOL, HDL, LDLCALC, TRIG, CHOLHDL, LDLDIRECT in the last 72 hours. Thyroid Function Tests: No results for input(s): TSH, T4TOTAL, FREET4, T3FREE, THYROIDAB in the last 72 hours. Anemia Panel: No results for input(s): VITAMINB12, FOLATE, FERRITIN, TIBC, IRON, RETICCTPCT in the last 72 hours.    Radiology Studies: I have reviewed all of the imaging during this hospital visit personally     Scheduled Meds: . insulin aspart  0-9 Units Subcutaneous Q4H  . insulin glargine  10 Units Subcutaneous QHS  . levothyroxine  150 mcg Oral QAC breakfast  . pantoprazole  40 mg Oral Daily  . pregabalin  75 mg Oral BID  . simvastatin  20 mg Oral q1800  .  sodium chloride flush  3 mL Intravenous Q12H   Continuous Infusions: . sodium chloride    . sodium chloride 75 mL/hr at 11/03/18 0222  . cefTRIAXone (ROCEPHIN)  IV 1 g (11/02/18 1356)     LOS: 3 days        Dimas Scheck Gerome Apley, MD

## 2018-11-03 NOTE — Progress Notes (Signed)
Notified that patient was desaturating on CPAP with sleep.  Oxygen at 3 lpm added to device.  Saturations now 97, up from 87%  Will continue to monitor,

## 2018-11-04 DIAGNOSIS — J45909 Unspecified asthma, uncomplicated: Secondary | ICD-10-CM

## 2018-11-04 LAB — CULTURE, BLOOD (ROUTINE X 2)
Culture: NO GROWTH
Culture: NO GROWTH
Special Requests: ADEQUATE

## 2018-11-04 LAB — CBC WITH DIFFERENTIAL/PLATELET
Abs Immature Granulocytes: 0.08 10*3/uL — ABNORMAL HIGH (ref 0.00–0.07)
Basophils Absolute: 0 10*3/uL (ref 0.0–0.1)
Basophils Relative: 0 %
Eosinophils Absolute: 0.2 10*3/uL (ref 0.0–0.5)
Eosinophils Relative: 2 %
HCT: 36.8 % (ref 36.0–46.0)
Hemoglobin: 11.1 g/dL — ABNORMAL LOW (ref 12.0–15.0)
Immature Granulocytes: 1 %
Lymphocytes Relative: 9 %
Lymphs Abs: 1.2 10*3/uL (ref 0.7–4.0)
MCH: 24.9 pg — ABNORMAL LOW (ref 26.0–34.0)
MCHC: 30.2 g/dL (ref 30.0–36.0)
MCV: 82.5 fL (ref 80.0–100.0)
Monocytes Absolute: 1.9 10*3/uL — ABNORMAL HIGH (ref 0.1–1.0)
Monocytes Relative: 15 %
Neutro Abs: 9.5 10*3/uL — ABNORMAL HIGH (ref 1.7–7.7)
Neutrophils Relative %: 73 %
Platelets: 166 10*3/uL (ref 150–400)
RBC: 4.46 MIL/uL (ref 3.87–5.11)
RDW: 15.6 % — ABNORMAL HIGH (ref 11.5–15.5)
WBC: 12.9 10*3/uL — ABNORMAL HIGH (ref 4.0–10.5)
nRBC: 0 % (ref 0.0–0.2)

## 2018-11-04 LAB — BASIC METABOLIC PANEL
Anion gap: 9 (ref 5–15)
BUN: 44 mg/dL — ABNORMAL HIGH (ref 6–20)
CO2: 26 mmol/L (ref 22–32)
Calcium: 8.6 mg/dL — ABNORMAL LOW (ref 8.9–10.3)
Chloride: 109 mmol/L (ref 98–111)
Creatinine, Ser: 1.53 mg/dL — ABNORMAL HIGH (ref 0.44–1.00)
GFR calc Af Amer: 43 mL/min — ABNORMAL LOW (ref >60)
GFR calc non Af Amer: 37 mL/min — ABNORMAL LOW (ref >60)
Glucose, Bld: 207 mg/dL — ABNORMAL HIGH (ref 70–99)
Potassium: 3.9 mmol/L (ref 3.5–5.1)
Sodium: 144 mmol/L (ref 135–145)

## 2018-11-04 LAB — GLUCOSE, CAPILLARY
Glucose-Capillary: 162 mg/dL — ABNORMAL HIGH (ref 70–99)
Glucose-Capillary: 178 mg/dL — ABNORMAL HIGH (ref 70–99)
Glucose-Capillary: 183 mg/dL — ABNORMAL HIGH (ref 70–99)
Glucose-Capillary: 244 mg/dL — ABNORMAL HIGH (ref 70–99)
Glucose-Capillary: 284 mg/dL — ABNORMAL HIGH (ref 70–99)
Glucose-Capillary: 357 mg/dL — ABNORMAL HIGH (ref 70–99)

## 2018-11-04 NOTE — Discharge Summary (Addendum)
Physician Discharge Summary  Amy [name] QIH:[id number] DOB: 05/02/1961 DOA: 10/30/2018  PCP: Velna Hatchet, MD  Admit date: 10/30/2018 Discharge date: 11/04/2018  Admitted From: Home  Disposition:  Home   Recommendations for Outpatient Follow-up and new medication changes:  1. Follow up with Dr. Ardeth Perfect in 7 days.  2. Continue antibiotic therapy with oral clindamycin. 3. Will order a hospital bed, patient is legally blind and having difficulty transferring in and out of the bed, provoking injury to her upper extremities.   4. Pain control with hydrocodone and acetaminophen as needed.   Home Health: yes   Equipment/Devices: Hospital bed    Discharge Condition: stable  CODE STATUS: full  Diet recommendation: heart healthy and diabetic prudent.   Brief/Interim Summary: 58 year old female who presented with fever and generalized weakness. She does have significant past medical history of diabetes mellitus type 2, chronic kidney disease stage III, diastolic heart failure and obstructive sleep apnea. She reported right elbow pain, persistent worse with movement. As an outpatient she was recently diagnosed with a respiratory illness placed on antibiotic therapy.On her initial physical examination, blood pressure 112/63, heart rate 70, respiratory rate17, temperature 98.3, oxygen saturation 99%.She had moist mucous membranes, lungs clear to auscultation bilaterally, heart S1-S2 present rhythmic, abdomen soft nontender, 1+ pretibial bilateral edema, right elbow joint with erythema, increased local temperature and edema. Sodium 144, potassium 3.7, chloride 102, bicarb 39, glucose 128, BUN 67, creatinine 2.24, white count 16.9, hemoglobin 9.7, hematocrit 37.8, platelets 148.SARS COVID-19 was negative. Urinalysis negative for infection.Chest radiograph with bilateral increase vascular lung markings,no infiltrates. Right elbow x-rays with no fractures.EKG with70 bpm, normal axis,  normal intervals, sinus rhythm with normal conduction, no ST segment or T wave changes.  Patient was admitted to the hospital working diagnosis right olecranon bursitis.  1.  Acute right elbow infected bursitis complicated with sepsis present on admission.  Patient received IV fluids and IV antibiotic therapy.  Fluid culture from right olecranon aspirate resulted in no growth.  Her blood cultures came no growth.  Initially patient was placed on IV ceftriaxone with persistent pain and leukocytosis, she was eventually changed to IV vancomycin with improvement of her symptoms.  She had a further work-up work-up with CT of the right elbow which showed fairly marked soft tissue thickening over the olecranon region, suggest olecranon bursitis.  Orthopedics was consulted, recommendations to continue medical therapy with antibiotics and analgesics.  Patient is legally blind and has difficulty getting in and out of her bed, provoking injury to her upper extremities.  A hospital bed will be ordered, to prevent further injuries. Patient will continue taking clindamycin for the next 7 days. Pain control with hydrocodone as needed.   2.  Hypertension.  Her blood pressure remained well controlled, patient did not receive antihypertensive agents during hospitalization.  3.  Acute kidney injury chronic kidney disease stage III.  Patient received IV fluids with improvement of kidney function, at discharge patient will need close follow-up on renal function and electrolytes. Continue diuretic therapy with metolazone, torsemide and spironolactone, along with K supplements.   4.  Asthma.  No signs of acute exacerbation.  5.  Diastolic heart failure.  Remained stable with no signs of exacerbation. To resume diuretic regimen at discharge,   6.  Type 2 diabetes mellitus.  Patient received a reduced dose of insulin compared to her home regimen, 10 units basal, along with insulin sliding scale, capillary glucose remained  well controlled. At discharge will resume tresiba 60 units  at bedtime and continue insulin sliding scale.   7.  Dyslipidemia with obesity, morbid. Her BMI is 44.3, continue statin therapy.  8.  Hypothyroidism.  Continue levothyroxine.  Discharge Diagnoses:  Principal Problem:   Septic bursitis of elbow, right Active Problems:   Asthma   Insulin dependent diabetes mellitus with complications (HCC)   Essential hypertension   Chronic diastolic heart failure (HCC)   Obstructive sleep apnea on CPAP   CKD (chronic kidney disease), stage III [facility])    Discharge Instructions   Allergies as of 11/05/2018      Reactions   Penicillins Hives, Swelling   Has patient had a PCN reaction causing immediate rash, facial/tongue/throat swelling, SOB or lightheadedness with hypotension: yes- face swelling Has patient had a PCN reaction causing severe rash involving mucus membranes or skin necrosis: no Has patient had a PCN reaction that required hospitalization unknown (childhood allergy) Has patient had a PCN reaction occurring within the last 10 years: no If all of the above answers are "NO", then may proceed with Cephalosporin use.   Codeine Nausea Only   Iodine-131 Hives   Iohexol Hives   Pt developed itching and hives along with nasal congestion; needs 13 hour premeds for future studies, Onset Date: [id number]   Lisinopril Cough   Sulfa Antibiotics Nausea And Vomiting      Medication List    STOP taking these medications   furosemide 40 MG tablet Commonly known as: LASIX     TAKE these medications   albuterol 108 (90 Base) MCG/ACT inhaler Commonly known as: VENTOLIN HFA Inhale 2 puffs into the lungs every 4 (four) hours as needed for wheezing or shortness of breath. For short   aspirin EC 81 MG tablet Take 81 mg by mouth daily.   B-D UF III MINI PEN NEEDLES 31G X 5 MM Misc Generic drug: Insulin Pen Needle U 1 PEN NEEDLE QID WITH EACH INJECTION   cetirizine 10 MG tablet Commonly  known as: ZYRTEC Take 10 mg by mouth at bedtime.   clindamycin 300 MG capsule Commonly known as: CLEOCIN Take 1 capsule (300 mg total) by mouth every 6 (six) hours for 7 days.   esomeprazole 40 MG capsule Commonly known as: NEXIUM Take 40 mg by mouth daily.   fluticasone 50 MCG/ACT nasal spray Commonly known as: FLONASE Place 1 spray into both nostrils daily.   guaiFENesin 600 MG 12 hr tablet Commonly known as: Mucinex Take 1 tablet (600 mg total) by mouth 2 (two) times daily.   HumaLOG KwikPen 100 UNIT/ML KwikPen Generic drug: insulin lispro Inject 8-14 Units into the skin See admin instructions. Inject 8 units in morning, 10 units at lunch, and 14 units at dinner   HYDROcodone-acetaminophen 5-325 MG tablet Commonly known as: NORCO/VICODIN Take 1 tablet by mouth every 6 (six) hours as needed for severe pain.   levothyroxine 150 MCG tablet Commonly known as: SYNTHROID Take 150 mcg by mouth daily before breakfast.   Linzess 145 MCG Caps capsule Generic drug: linaclotide Take 145 mcg by mouth daily as needed (constipation).   methocarbamol 500 MG tablet Commonly known as: ROBAXIN Take 500 mg by mouth every 8 (eight) hours as needed for muscle spasms.   metolazone 2.5 MG tablet Commonly known as: ZAROXOLYN Take 1 tablet (2.5 mg total) by mouth daily.   potassium chloride SA 20 MEQ tablet Commonly known as: K-DUR TAKE 1 TABLET (20 MEQ TOTAL) BY MOUTH DAILY What changed: See the new instructions.   pregabalin 150 MG  capsule Commonly known as: LYRICA Take 150 mg by mouth 3 (three) times daily.   simvastatin 20 MG tablet Commonly known as: ZOCOR Take 20 mg by mouth daily.   spironolactone 25 MG tablet Commonly known as: ALDACTONE Take 0.5 tablets (12.5 mg total) by mouth daily.   torsemide 20 MG tablet Commonly known as: DEMADEX Take 20-30 mg by mouth See admin instructions. Take 30 mg by mouth in the morning and 20 mg in the evening   Tresiba FlexTouch 200  UNIT/ML Sopn Generic drug: Insulin Degludec Inject 60 Units into the skin at bedtime.   triamcinolone cream 0.1 % Commonly known as: KENALOG Apply 1 application topically daily as needed (irritation).            Durable Medical Equipment  (From admission, onward)         Start     Ordered   11/04/18 1056  For home use only DME Hospital bed  Once    Question Answer Comment  Length of Need 6 Months   The above medical condition requires: Patient requires the ability to reposition frequently   Bed type Semi-electric      11/04/18 1055            Consultations:  Orthopedics   Procedures/Studies: Dg Elbow Complete Right  Result Date: 10/30/2018 CLINICAL DATA:  58 y/o  F; right elbow pain.  Redness and swelling. EXAM: RIGHT ELBOW - COMPLETE 3+ VIEW COMPARISON:  None. FINDINGS: There is no evidence of fracture, dislocation, or joint effusion. There is no evidence of arthropathy or other focal bone abnormality. IMPRESSION: No acute fracture or dislocation identified. Electronically Signed   By: Kristine Garbe M.D.   On: 10/30/2018 20:45   Ct Elbow Right Wo Contrast  Result Date: 11/02/2018 CLINICAL DATA:  Right arm pain and swelling. EXAM: CT OF THE LOWER UPPER EXTREMITY WITHOUT CONTRAST TECHNIQUE: Multidetector CT imaging of the right upper extremity was performed according to the standard protocol. COMPARISON:  Radiograph 10/30/2018 FINDINGS: The joint spaces are maintained. No fracture or osteochondral lesion is identified. Minimal spurring type changes near the common extensor tendon attachment at the lateral epicondyle. No significant degenerative changes. No joint effusion. There is fairly marked soft tissue thickening along the olecranon with some surrounding inflammatory changes. This area demonstrates increased attenuation and could be complicated olecranon bursitis (infectious or hemorrhagic bursitis) with surrounding cellulitis. IMPRESSION: 1. Fairly marked  soft tissue thickening over the olecranon region could suggest olecranon bursitis. Could not exclude infection or hemorrhage. 2. No significant bony findings and no elbow joint effusion. Electronically Signed   By: Marijo Sanes M.D.   On: 11/02/2018 17:33   Dg Chest Port 1 View  Result Date: 10/30/2018 CLINICAL DATA:  Cough, fever, shortness of breath. EXAM: PORTABLE CHEST 1 VIEW COMPARISON:  03/06/2018 FINDINGS: Mild enlargement of the cardiopericardial silhouette, with indistinctness of the pulmonary vasculature but no definite interstitial edema. Thoracic spondylosis is present. No appreciable airspace opacity. No blunting of the costophrenic angles. IMPRESSION: 1. Mild enlargement of the cardiopericardial silhouette potentially with pulmonary venous hypertension, but no overt edema. Electronically Signed   By: Van Clines M.D.   On: 10/30/2018 18:06      Procedures:   Subjective: Patient reports improvement in her right elbow pain and edema, continue to have induration and decrease range of motion, no nausea or vomiting, no chest pain or dyspnea.   Discharge Exam: Vitals:   11/03/18 2138 11/04/18 0538  BP: 133/67 (!) 123/58  Pulse: 93 74  Resp: 18 18  Temp: 100.3 F (37.9 C) 98.9 F (37.2 C)  SpO2: 96% 97%   Vitals:   11/03/18 1325 11/03/18 2138 11/04/18 0538 11/04/18 0700  BP: (!) 121/59 133/67 (!) 123/58   Pulse: 79 93 74   Resp: 18 18 18    Temp: 98.8 F (37.1 C) 100.3 F (37.9 C) 98.9 F (37.2 C)   TempSrc:  Oral Oral   SpO2: 92% 96% 97%   Weight:    102.5 kg  Height:        General: Not in pain or dyspnea  Neurology: Awake and alert, non focal  E ENT: no pallor, no icterus, oral mucosa moist Cardiovascular: No JVD. S1-S2 present, rhythmic, no gallops, rubs, or murmurs. No lower extremity edema. Pulmonary: positive breath sounds bilaterally, adequate air movement, no wheezing, rhonchi or rales. Gastrointestinal. Abdomen with no organomegaly, non tender, no  rebound or guarding Skin. No rashes Musculoskeletal: no joint deformities/ right elbow with tenderness to palpation and induration, decreased range of motion.    The results of significant diagnostics from this hospitalization (including imaging, microbiology, ancillary and laboratory) are listed below for reference.     Microbiology: Recent Results (from the past 240 hour(s))  SARS Coronavirus 2 (CEPHEID- Performed in [facility] hospital lab), Hosp Order     Status: None   Collection Time: 10/30/18  4:46 PM   Specimen: Nasopharyngeal Swab  Result Value Ref Range Status   SARS Coronavirus 2 NEGATIVE NEGATIVE Final    Comment: (NOTE) If result is NEGATIVE SARS-CoV-2 target nucleic acids are NOT DETECTED. The SARS-CoV-2 RNA is generally detectable in upper and lower  respiratory specimens during the acute phase of infection. The lowest  concentration of SARS-CoV-2 viral copies this assay can detect is 250  copies / mL. A negative result does not preclude SARS-CoV-2 infection  and should not be used as the sole basis for treatment or other  patient management decisions.  A negative result may occur with  improper specimen collection / handling, submission of specimen other  than nasopharyngeal swab, presence of viral mutation(s) within the  areas targeted by this assay, and inadequate number of viral copies  (<250 copies / mL). A negative result must be combined with clinical  observations, patient history, and epidemiological information. If result is POSITIVE SARS-CoV-2 target nucleic acids are DETECTED. The SARS-CoV-2 RNA is generally detectable in upper and lower  respiratory specimens dur ing the acute phase of infection.  Positive  results are indicative of active infection with SARS-CoV-2.  Clinical  correlation with patient history and other diagnostic information is  necessary to determine patient infection status.  Positive results do  not rule out bacterial infection or  co-infection with other viruses. If result is PRESUMPTIVE POSTIVE SARS-CoV-2 nucleic acids MAY BE PRESENT.   A presumptive positive result was obtained on the submitted specimen  and confirmed on repeat testing.  While 2019 novel coronavirus  (SARS-CoV-2) nucleic acids may be present in the submitted sample  additional confirmatory testing may be necessary for epidemiological  and / or clinical management purposes  to differentiate between  SARS-CoV-2 and other Sarbecovirus currently known to infect humans.  If clinically indicated additional testing with an alternate test  methodology [phone]) is advised. The SARS-CoV-2 RNA is generally  detectable in upper and lower respiratory sp ecimens during the acute  phase of infection. The expected result is Negative. Fact Sheet for Patients:  StrictlyIdeas.no Fact Sheet for Healthcare Providers:  BankingDealers.co.za This test is not yet approved or cleared by the [place] and has been authorized for detection and/or diagnosis of SARS-CoV-2 by FDA under an Emergency Use Authorization (EUA).  This EUA will remain in effect (meaning this test can be used) for the duration of the COVID-19 declaration under Section 564(b)(1) of the Act, 21 U.S.C. section 360bbb-3(b)(1), unless the authorization is terminated or revoked sooner. Performed at [facility], [place] [address] [place], [place] [address]   Blood Culture (routine x 2)     Status: None (Preliminary result)   Collection Time: 10/30/18  5:00 PM   Specimen: BLOOD LEFT HAND  Result Value Ref Range Status   Specimen Description   Final    BLOOD LEFT HAND Performed at [facility], [place] [address] [place], [place] [address]    Special Requests   Final    BOTTLES DRAWN AEROBIC ONLY Blood Culture results may not be optimal due to an inadequate volume of blood received in culture bottles Performed at [facility] [address], [place], [place] [address]    Culture   Final    NO GROWTH 4 DAYS Performed at [facility], [place] [address], [place], [place] [address]    Report Status PENDING  Incomplete  Blood Culture (routine x 2)     Status: None (Preliminary result)   Collection Time: 10/30/18  5:02 PM   Specimen: BLOOD  Result Value Ref Range Status   Specimen Description   Final    BLOOD LEFT ANTECUBITAL Performed at [facility] [address] [place], [place] [address]    Special Requests   Final    BOTTLES DRAWN AEROBIC AND ANAEROBIC Blood Culture adequate volume Performed at [facility] [address], [place], [place] [address]    Culture   Final    NO GROWTH 4 DAYS Performed at [facility], [place] [address] [place], [place] [address]    Report Status PENDING  Incomplete  Body fluid culture     Status: None   Collection Time: 10/30/18  5:52 PM   Specimen: Synovium; Body Fluid  Result Value Ref Range Status   Specimen Description   Final    SYNOVIAL BURSA/CYST Performed at [facility] [address] [place], [place] [address]    Special Requests   Final    NONE Performed at [facility], [place] [address], [place],  [address]    Gram Stain   Final    NO ORGANISMS SEEN WBC PRESENT, PREDOMINANTLY PMN CYTOSPIN SMEAR Gram Stain Report Called to,Read Back By and Verified With: Mort Sawyers [id number] @ 1927 BY J SCOTTON    Culture   Final    NO GROWTH Performed at [facility], [place] [address], [place],  [address]    Report Status 11/02/2018 FINAL  Final     Labs: BNP (last 3 results) Recent Labs    04/05/18 1106  BNP 89.2   Basic Metabolic Panel: Recent Labs  Lab 10/31/18 0356 11/01/18 0335 11/02/18 0340 11/03/18 0434 11/04/18 0342  NA 143 142 142 144 144  K 3.8 4.0 4.0 4.1 3.9  CL 101 103 104 106 109  CO2 27 27 25 25 26    GLUCOSE 96 178* 229* 158* 207*  BUN 63* 52* 56* 48* 44*  CREATININE 1.91* 1.73* 1.98* 1.64* 1.53*  CALCIUM 9.6 9.0 8.9 8.9 8.6*  Liver Function Tests: Recent Labs  Lab 10/30/18 1515  AST 28  ALT 30  ALKPHOS 112  BILITOT 0.4  PROT 7.3  ALBUMIN 3.2*   No results for input(s): LIPASE, AMYLASE in the last 168 hours. No results for input(s): AMMONIA in the last 168 hours. CBC: Recent Labs  Lab 10/31/18 0356 11/01/18 0335 11/02/18 0340 11/03/18 0434 11/04/18 0342  WBC 10.2 11.2* 13.3* 13.4* 12.9*  NEUTROABS 7.0 8.0* 9.7* 10.1* 9.5*  HGB 14.1 11.7* 12.1 11.3* 11.1*  HCT 46.5* 37.6 38.7 37.6 36.8  MCV 81.9 81.2 81.5 82.3 82.5  PLT 145* 147* 150 156 166   Cardiac Enzymes: No results for input(s): CKTOTAL, CKMB, CKMBINDEX, TROPONINI in the last 168 hours. BNP: Invalid input(s): POCBNP CBG: Recent Labs  Lab 11/03/18 1601 11/03/18 1958 11/03/18 2358 11/04/18 0341 11/04/18 0725  GLUCAP 216* 192* 244* 183* 162*   D-Dimer No results for input(s): DDIMER in the last 72 hours. Hgb A1c Recent Labs    11/02/18 0340  HGBA1C 10.7*   Lipid Profile No results for input(s): CHOL, HDL, LDLCALC, TRIG, CHOLHDL, LDLDIRECT in the last 72 hours. Thyroid function studies No results for input(s): TSH, T4TOTAL, T3FREE, THYROIDAB in the last 72 hours.  Invalid input(s): FREET3 Anemia work up No results for input(s): VITAMINB12, FOLATE, FERRITIN, TIBC, IRON, RETICCTPCT in the last 72 hours. Urinalysis    Component Value Date/Time   COLORURINE YELLOW 10/30/2018 1645   APPEARANCEUR HAZY (A) 10/30/2018 1645   LABSPEC 1.010 10/30/2018 1645   PHURINE 5.0 10/30/2018 1645   GLUCOSEU NEGATIVE 10/30/2018 1645   HGBUR NEGATIVE 10/30/2018 1645   [place] NEGATIVE 10/30/2018 1645   KETONESUR NEGATIVE 10/30/2018 1645   PROTEINUR NEGATIVE 10/30/2018 1645   UROBILINOGEN 0.2 01/16/2015 1121   NITRITE NEGATIVE 10/30/2018 1645   LEUKOCYTESUR NEGATIVE 10/30/2018 1645   Sepsis  Labs Invalid input(s): PROCALCITONIN,  WBC,  LACTICIDVEN Microbiology Recent Results (from the past 240 hour(s))  SARS Coronavirus 2 (CEPHEID- Performed in [facility] hospital lab), Hosp Order     Status: None   Collection Time: 10/30/18  4:46 PM   Specimen: Nasopharyngeal Swab  Result Value Ref Range Status   SARS Coronavirus 2 NEGATIVE NEGATIVE Final    Comment: (NOTE) If result is NEGATIVE SARS-CoV-2 target nucleic acids are NOT DETECTED. The SARS-CoV-2 RNA is generally detectable in upper and lower  respiratory specimens during the acute phase of infection. The lowest  concentration of SARS-CoV-2 viral copies this assay can detect is 250  copies / mL. A negative result does not preclude SARS-CoV-2 infection  and should not be used as the sole basis for treatment or other  patient management decisions.  A negative result may occur with  improper specimen collection / handling, submission of specimen other  than nasopharyngeal swab, presence of viral mutation(s) within the  areas targeted by this assay, and inadequate number of viral copies  (<250 copies / mL). A negative result must be combined with clinical  observations, patient history, and epidemiological information. If result is POSITIVE SARS-CoV-2 target nucleic acids are DETECTED. The SARS-CoV-2 RNA is generally detectable in upper and lower  respiratory specimens dur ing the acute phase of infection.  Positive  results are indicative of active infection with SARS-CoV-2.  Clinical  correlation with patient history and other diagnostic information is  necessary to determine patient infection status.  Positive results do  not rule out bacterial infection or co-infection with other viruses. If result is PRESUMPTIVE POSTIVE SARS-CoV-2 nucleic acids MAY  BE PRESENT.   A presumptive positive result was obtained on the submitted specimen  and confirmed on repeat testing.  While 2019 novel coronavirus  (SARS-CoV-2) nucleic  acids may be present in the submitted sample  additional confirmatory testing may be necessary for epidemiological  and / or clinical management purposes  to differentiate between  SARS-CoV-2 and other Sarbecovirus currently known to infect humans.  If clinically indicated additional testing with an alternate test  methodology [phone]) is advised. The SARS-CoV-2 RNA is generally  detectable in upper and lower respiratory sp ecimens during the acute  phase of infection. The expected result is Negative. Fact Sheet for Patients:  StrictlyIdeas.no Fact Sheet for Healthcare Providers: BankingDealers.co.za This test is not yet approved or cleared by the [place] FDA and has been authorized for detection and/or diagnosis of SARS-CoV-2 by FDA under an Emergency Use Authorization (EUA).  This EUA will remain in effect (meaning this test can be used) for the duration of the COVID-19 declaration under Section 564(b)(1) of the Act, 21 U.S.C. section 360bbb-3(b)(1), unless the authorization is terminated or revoked sooner. Performed at [facility], [place] [address], [place], [place] [address]   Blood Culture (routine x 2)     Status: None (Preliminary result)   Collection Time: 10/30/18  5:00 PM   Specimen: BLOOD LEFT HAND  Result Value Ref Range Status   Specimen Description   Final    BLOOD LEFT HAND Performed at [facility], [place] [address], [place], [place] [address]    Special Requests   Final    BOTTLES DRAWN AEROBIC ONLY Blood Culture results may not be optimal due to an inadequate volume of blood received in culture bottles Performed at [facility] [address] [place], [place] [address]    Culture   Final    NO GROWTH 4 DAYS Performed at [facility], [place] [address], [place], [place] [address]    Report Status PENDING  Incomplete  Blood Culture (routine x 2)     Status: None  (Preliminary result)   Collection Time: 10/30/18  5:02 PM   Specimen: BLOOD  Result Value Ref Range Status   Specimen Description   Final    BLOOD LEFT ANTECUBITAL Performed at [facility] [address], [place], [place] [address]    Special Requests   Final    BOTTLES DRAWN AEROBIC AND ANAEROBIC Blood Culture adequate volume Performed at [facility] [address], [place], [place] [address]    Culture   Final    NO GROWTH 4 DAYS Performed at [facility], [place] [address], [place], [place] [address]    Report Status PENDING  Incomplete  Body fluid culture     Status: None   Collection Time: 10/30/18  5:52 PM   Specimen: Synovium; Body Fluid  Result Value Ref Range Status   Specimen Description   Final    SYNOVIAL BURSA/CYST Performed at [facility] [address], [place], [place] [address]    Special Requests   Final    NONE Performed at [facility], [place] [address], [place], [place] [address]    Gram Stain   Final    NO ORGANISMS SEEN WBC PRESENT, PREDOMINANTLY PMN CYTOSPIN SMEAR Gram Stain Report Called to,Read Back By and Verified With: Mort Sawyers [id number] @ [facility]    Culture   Final    NO  GROWTH Performed at [facility], [place] [address] [place], [place] [address]    Report Status 11/02/2018 FINAL  Final     Time coordinating discharge: 45 minutes  SIGNED:   Tawni Millers, MD  [facility] 11/04/2018, 10:56 AM

## 2018-11-04 NOTE — Plan of Care (Signed)
Patient lying in bed this morning; no complaints of pain. States she rested well last night and has no needs. Will continue to monitor.

## 2018-11-04 NOTE — Progress Notes (Signed)
     Subjective:  Patient reports pain as moderate, states that it has improved.     Objective:   VITALS:   Vitals:   11/03/18 2138 11/04/18 0538  BP: 133/67 (!) 123/58  Pulse: 93 74  Resp: 18 18  Temp: 100.3 F (37.9 C) 98.9 F (37.2 C)  SpO2: 96% 97%    Neurovascular intact Tender over the distal triceps tendon/muscle Swelling about the right elbow   LABS Recent Labs    11/02/18 0340 11/03/18 0434 11/04/18 0342  HGB 12.1 11.3* 11.1*  HCT 38.7 37.6 36.8  WBC 13.3* 13.4* 12.9*  PLT 150 156 166    Recent Labs    11/02/18 0340 11/03/18 0434 11/04/18 0342  NA 142 144 144  K 4.0 4.1 3.9  BUN 56* 48* 44*  CREATININE 1.98* 1.64* 1.53*  GLUCOSE 229* 158* 207*     Assessment/Plan:    - Hospitalist primary. Appreciate management - Continue abx treatment - States improvement with antibiotic treatment - Remains tender over the distal triceps tendon/muscle. - No plans for operative treatment at this time - OK for d/c from ortho standpoint, per Dr. Jeannie Fend - Follow up with Dr. Jeannie Fend in 1 week at [place]. Ernisha Sorn   PAC  11/04/2018, 9:11 AM

## 2018-11-05 LAB — CBC WITH DIFFERENTIAL/PLATELET
Abs Immature Granulocytes: 0.11 10*3/uL — ABNORMAL HIGH (ref 0.00–0.07)
Basophils Absolute: 0.1 10*3/uL (ref 0.0–0.1)
Basophils Relative: 0 %
Eosinophils Absolute: 0.2 10*3/uL (ref 0.0–0.5)
Eosinophils Relative: 1 %
HCT: 34.4 % — ABNORMAL LOW (ref 36.0–46.0)
Hemoglobin: 11.1 g/dL — ABNORMAL LOW (ref 12.0–15.0)
Immature Granulocytes: 1 %
Lymphocytes Relative: 11 %
Lymphs Abs: 1.3 10*3/uL (ref 0.7–4.0)
MCH: 25.5 pg — ABNORMAL LOW (ref 26.0–34.0)
MCHC: 32.3 g/dL (ref 30.0–36.0)
MCV: 79.1 fL — ABNORMAL LOW (ref 80.0–100.0)
Monocytes Absolute: 1.3 10*3/uL — ABNORMAL HIGH (ref 0.1–1.0)
Monocytes Relative: 11 %
Neutro Abs: 8.8 10*3/uL — ABNORMAL HIGH (ref 1.7–7.7)
Neutrophils Relative %: 76 %
Platelets: 131 10*3/uL — ABNORMAL LOW (ref 150–400)
RBC: 4.35 MIL/uL (ref 3.87–5.11)
RDW: 15.3 % (ref 11.5–15.5)
WBC: 11.6 10*3/uL — ABNORMAL HIGH (ref 4.0–10.5)
nRBC: 0 % (ref 0.0–0.2)

## 2018-11-05 LAB — BASIC METABOLIC PANEL
Anion gap: 11 (ref 5–15)
BUN: 38 mg/dL — ABNORMAL HIGH (ref 6–20)
CO2: 23 mmol/L (ref 22–32)
Calcium: 8.4 mg/dL — ABNORMAL LOW (ref 8.9–10.3)
Chloride: 106 mmol/L (ref 98–111)
Creatinine, Ser: 1.68 mg/dL — ABNORMAL HIGH (ref 0.44–1.00)
GFR calc Af Amer: 38 mL/min — ABNORMAL LOW (ref >60)
GFR calc non Af Amer: 33 mL/min — ABNORMAL LOW (ref >60)
Glucose, Bld: 309 mg/dL — ABNORMAL HIGH (ref 70–99)
Potassium: 4.4 mmol/L (ref 3.5–5.1)
Sodium: 140 mmol/L (ref 135–145)

## 2018-11-05 LAB — GLUCOSE, CAPILLARY
Glucose-Capillary: 344 mg/dL — ABNORMAL HIGH (ref 70–99)
Glucose-Capillary: 254 mg/dL — ABNORMAL HIGH (ref 70–99)
Glucose-Capillary: 225 mg/dL — ABNORMAL HIGH (ref 70–99)

## 2018-11-05 MED ORDER — CLINDAMYCIN HCL 300 MG PO CAPS
300.0000 mg | ORAL_CAPSULE | Freq: Four times a day (QID) | ORAL | Status: DC
  Administered 2018-11-05: 10:00:00 300 mg via ORAL
  Filled 2018-11-05: qty 1

## 2018-11-05 MED ORDER — HYDROCODONE-ACETAMINOPHEN 5-325 MG PO TABS: 1.0000 | ORAL_TABLET | Freq: Four times a day (QID) | ORAL | 0 refills | Status: DC | PRN

## 2018-11-05 MED ORDER — CLINDAMYCIN HCL 300 MG PO CAPS: 300.0000 mg | ORAL_CAPSULE | Freq: Four times a day (QID) | ORAL | 0 refills | Status: AC

## 2018-11-05 NOTE — Progress Notes (Addendum)
Patient is feeling better, her right elbow pain and edema continue to improve. She has remained afebrile. Inflammation at her right elbow is improving. Improving leukocytosis down to 11,6, Stable renal function with serum cr at 1,68 with Na at 140 and K at 4,4.   Patient is medically stable for discharge today.

## 2018-11-05 NOTE — Evaluation (Addendum)
Physical Therapy Evaluation Patient Details Name: Amy [name] MRN: [id number] DOB: 14-Nov-1960 Today's Date: 11/05/2018   History of Present Illness  58 yo female admitted with septic R elbow. Hx of DM, CKD, spinal stenosis, CHF, neuropathy  Clinical Impression  On eval, pt was Min guard-Min assist for mobility. She walked ~30 feet with a RW. Pt fatigues easily. Dyspnea 3/4 + several brief standing rest breaks needed during short walk. Pt is set to d/c home on today. She is agreeable to HHPT f/u-made RN aware. She stated she has aide assistance daily M-F.     Follow Up Recommendations Home health PT;Supervision - Intermittent    Equipment Recommendations  None recommended by PT    Recommendations for Other Services       Precautions / Restrictions Precautions Precautions: Fall Restrictions Weight Bearing Restrictions: No      Mobility  Bed Mobility               General bed mobility comments: oob in recliner  Transfers Overall transfer level: Needs assistance Equipment used: Rolling walker (2 wheeled) Transfers: Sit to/from Stand Sit to Stand: Min guard         General transfer comment: close guard for safety. VCs safety hand placement.  Ambulation/Gait Ambulation/Gait assistance: Min assist;Min guard Gait Distance (Feet): 30 Feet Assistive device: Rolling walker (2 wheeled) Gait Pattern/deviations: Step-through pattern;Decreased stride length     General Gait Details: Mildly unsteady. Slow gait speed. Pt fatigues easily. 3 brief standing rest breaks during this short walk. Dyspnea 3/4. Intermittent assist to steady.   Stairs            Wheelchair Mobility    Modified Rankin (Stroke Patients Only)       Balance Overall balance assessment: Needs assistance         Standing balance support: Bilateral upper extremity supported Standing balance-Leahy Scale: Poor                               Pertinent Vitals/Pain Pain  Assessment: Faces Pain Score: 6  Pain Location: back, R LE Pain Descriptors / Indicators: Aching Pain Intervention(s): Monitored during session;Limited activity within patient's tolerance    Home Living Family/patient expects to be discharged to:: Private residence Living Arrangements: Alone Available Help at Discharge: Home health(aide m-f 8-2) Type of Home: House Home Access: Level entry     Home Layout: One level Home Equipment: Walker - 4 wheels;Cane - single point;Wheelchair - manual      Prior Function Level of Independence: Needs assistance   Gait / Transfers Assistance Needed: walks with rollator  ADL's / Homemaking Assistance Needed: pt reports being independent in cooking and other ADLs, but has home health aide to assist from 8-2        Hand Dominance        Extremity/Trunk Assessment   Upper Extremity Assessment Upper Extremity Assessment: Generalized weakness    Lower Extremity Assessment Lower Extremity Assessment: Generalized weakness    Cervical / Trunk Assessment Cervical / Trunk Assessment: Normal  Communication   Communication: No difficulties  Cognition Arousal/Alertness: Awake/alert Behavior During Therapy: WFL for tasks assessed/performed Overall Cognitive Status: Within Functional Limits for tasks assessed                                        General Comments  Exercises     Assessment/Plan    PT Assessment Patient needs continued PT services  PT Problem List Decreased balance;Decreased activity tolerance;Decreased strength;Decreased mobility       PT Treatment Interventions DME instruction;Therapeutic activities;Gait training;Therapeutic exercise;Balance training;Functional mobility training;Patient/family education    PT Goals (Current goals can be found in the Care Plan section)  Acute Rehab PT Goals Patient Stated Goal: home today PT Goal Formulation: With patient Time For Goal Achievement:  11/19/18 Potential to Achieve Goals: Good    Frequency Min 3X/week   Barriers to discharge        Co-evaluation               AM-PAC PT "6 Clicks" Mobility  Outcome Measure Help needed turning from your back to your side while in a flat bed without using bedrails?: A Little Help needed moving from lying on your back to sitting on the side of a flat bed without using bedrails?: A Little Help needed moving to and from a bed to a chair (including a wheelchair)?: A Little Help needed standing up from a chair using your arms (e.g., wheelchair or bedside chair)?: A Little Help needed to walk in hospital room?: A Little Help needed climbing 3-5 steps with a railing? : A Lot 6 Click Score: 17    End of Session Equipment Utilized During Treatment: Gait belt Activity Tolerance: Patient limited by fatigue Patient left: in chair;with call bell/phone within reach   PT Visit Diagnosis: Unsteadiness on feet (R26.81);Difficulty in walking, not elsewhere classified (R26.2);Muscle weakness (generalized) (M62.81)    Time: 8616-8372 PT Time Calculation (min) (ACUTE ONLY): 19 min   Charges:   PT Evaluation $PT Eval Moderate Complexity: [place], PT Acute Rehabilitation Services Pager: [phone] Office: [phone]

## 2018-11-05 NOTE — Plan of Care (Signed)
Pt was discharged home today. Instructions were reviewed with patient, and questions were answered. Pt was taken to main entrance via wheelchair by NT.  

## 2018-11-08 NOTE — Progress Notes (Signed)
Reviewed discharged pt's chart, related to pt calling asking for a hospital bed. After reviewing the chart, there is no order for a hospital bed. A call back to Ms Kleppe to explain this information to pt was made to [phone]. Explained to pt that she would need to call her primary MD for hospital bed order. Pt was very pleasant and said okay, I will call now.

## 2018-11-20 IMAGING — CR DG CHEST 2V
2 series · 2 of 2 positions shown · non-contrast
Comparison: 07/03/2016

CLINICAL DATA: Shortness of breath.  Swelling of both legs.

EXAM:
CHEST  2 VIEW

[chest pa]
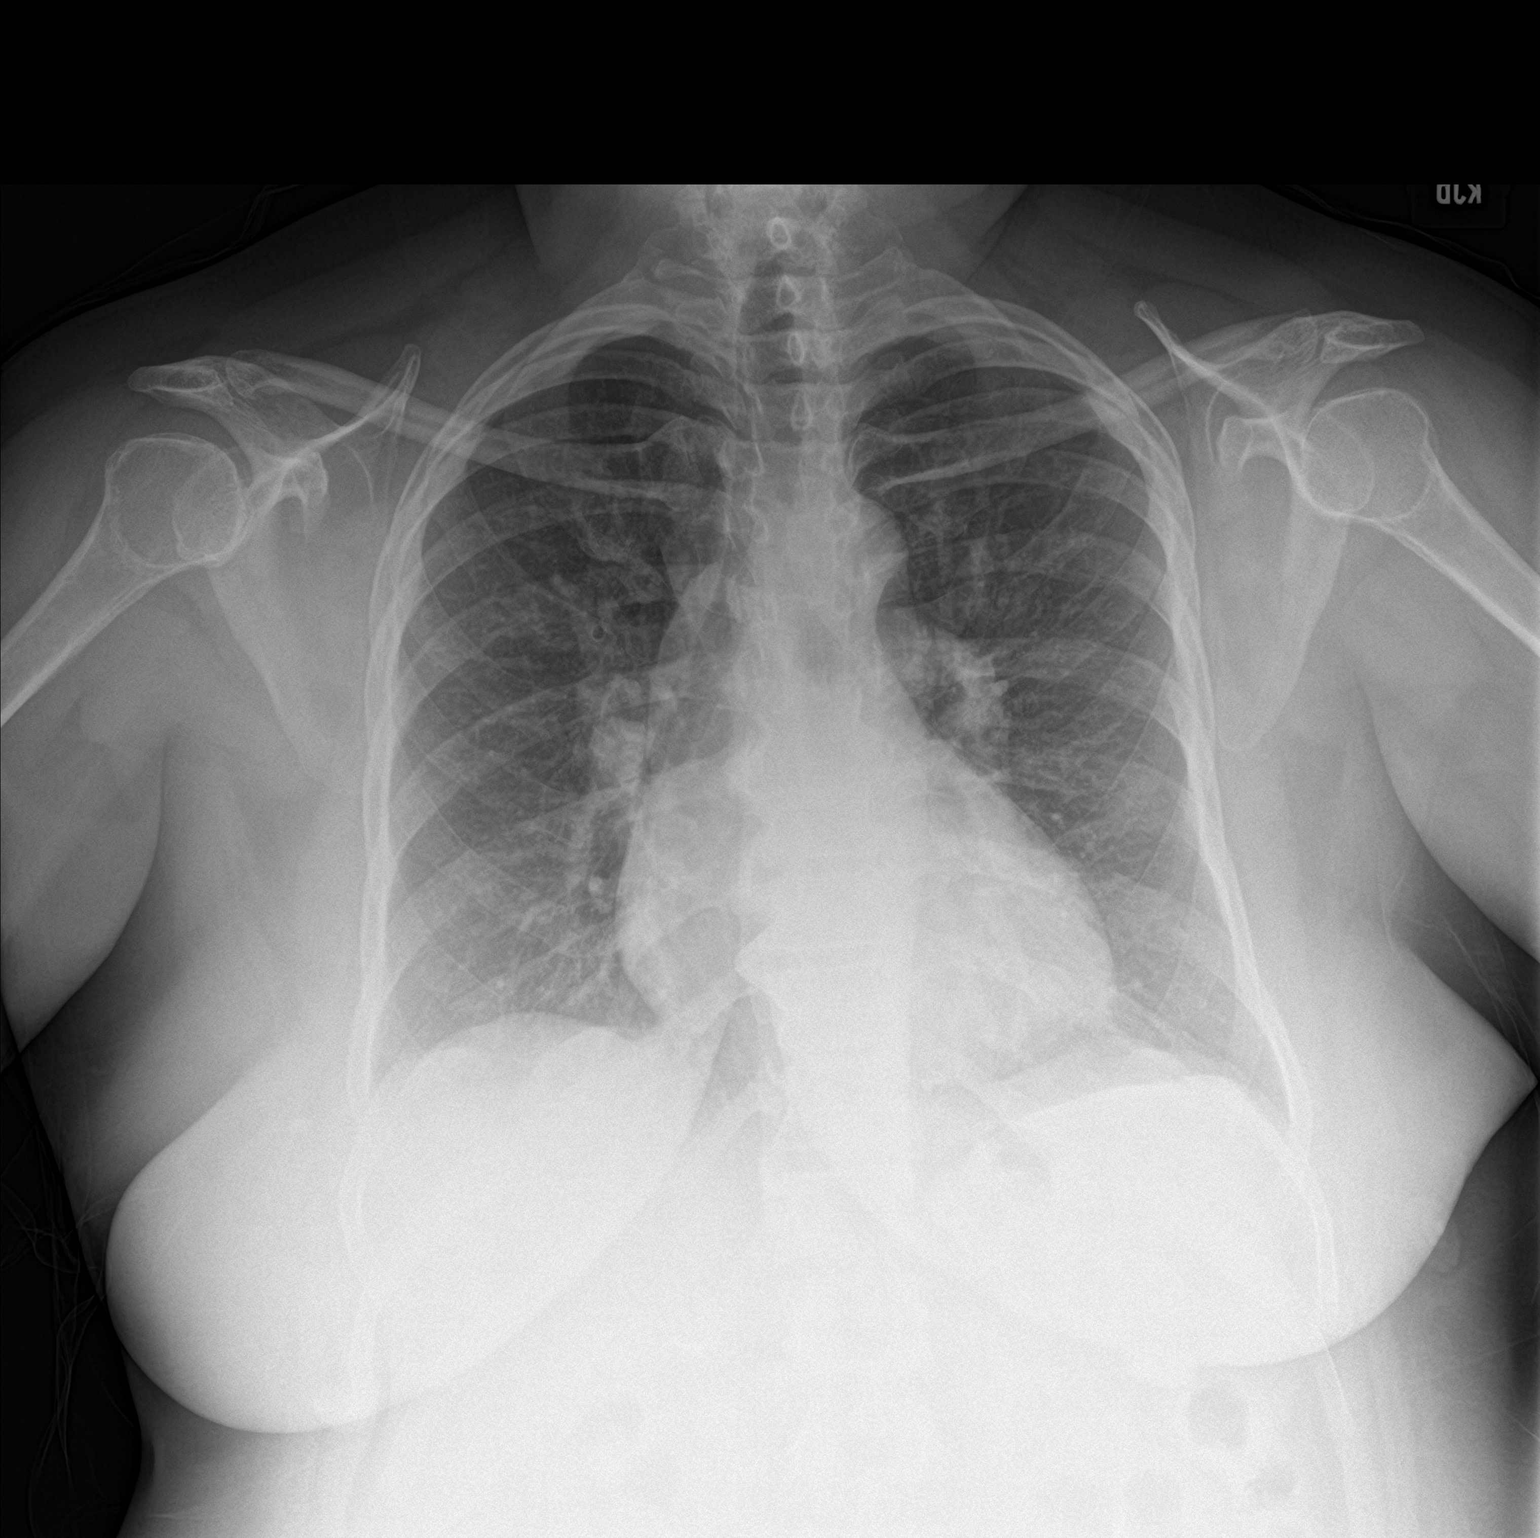

[chest lat]
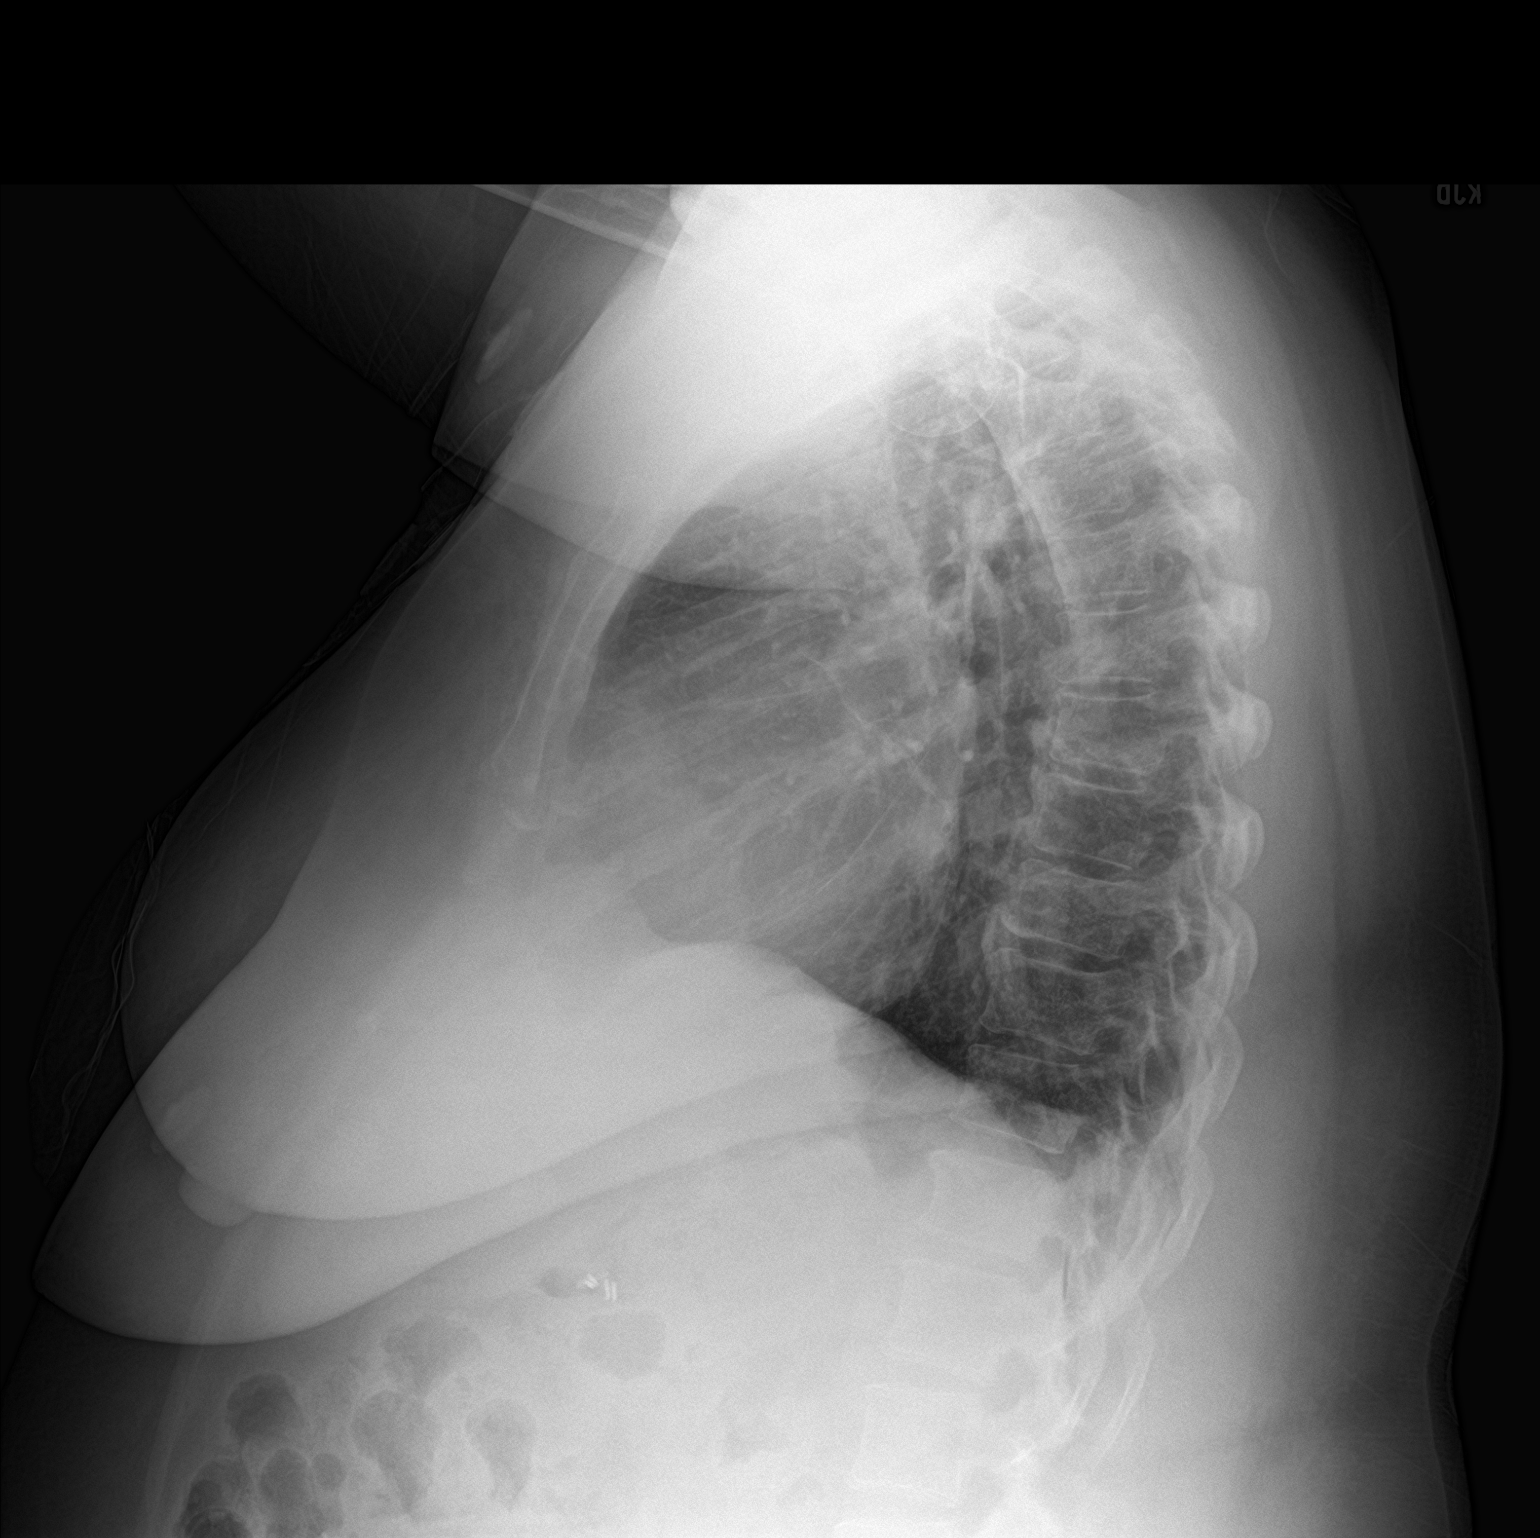

[2 of 2 positions shown; findings below may reference images not displayed]

FINDINGS: The heart size and mediastinal contours are within normal limits.
Both lungs are clear. The visualized skeletal structures are
unremarkable.
IMPRESSION: No active cardiopulmonary disease.

## 2018-11-21 IMAGING — CR DG FOOT COMPLETE 3+V*L*
3 series · 3 of 3 positions shown · non-contrast
Comparison: None.

CLINICAL DATA: Initial evaluation for acute pain with swelling.

EXAM:
LEFT FOOT - COMPLETE 3+ VIEW

[foot ap]
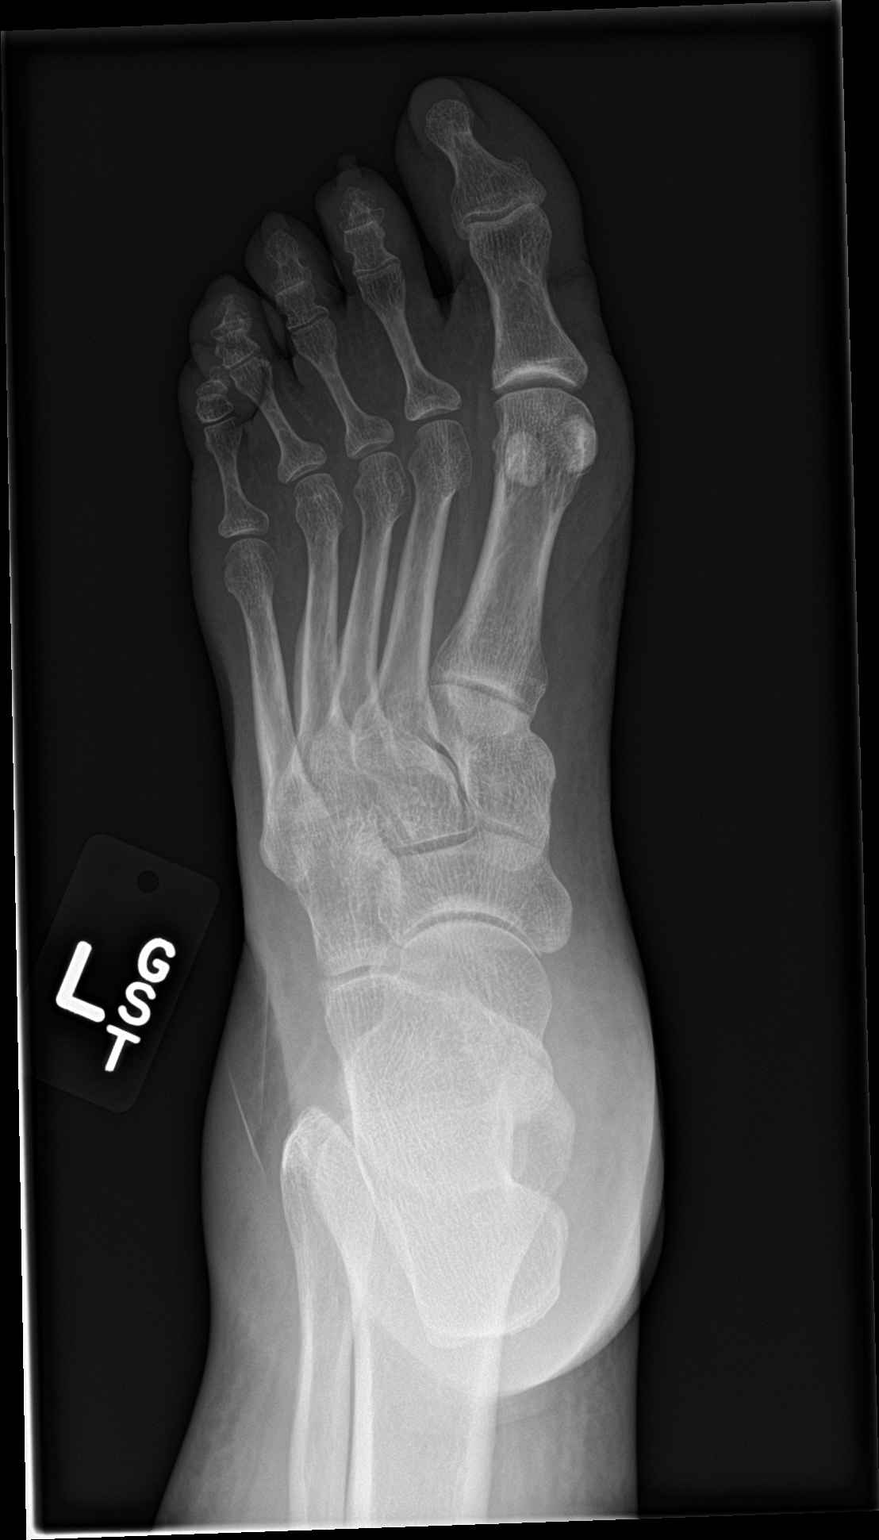

[foot obl]
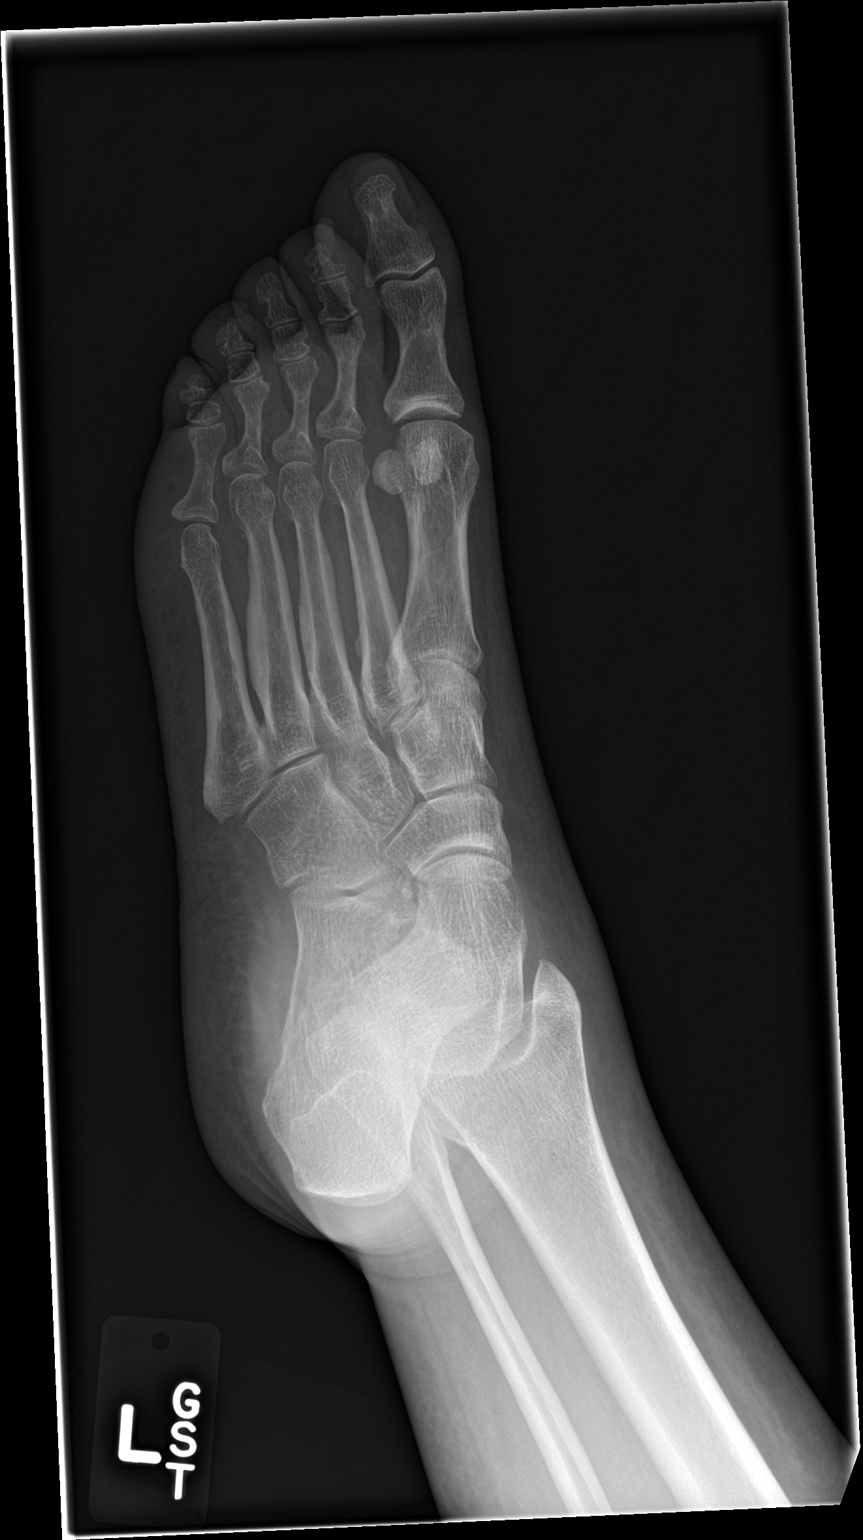

[foot lat]
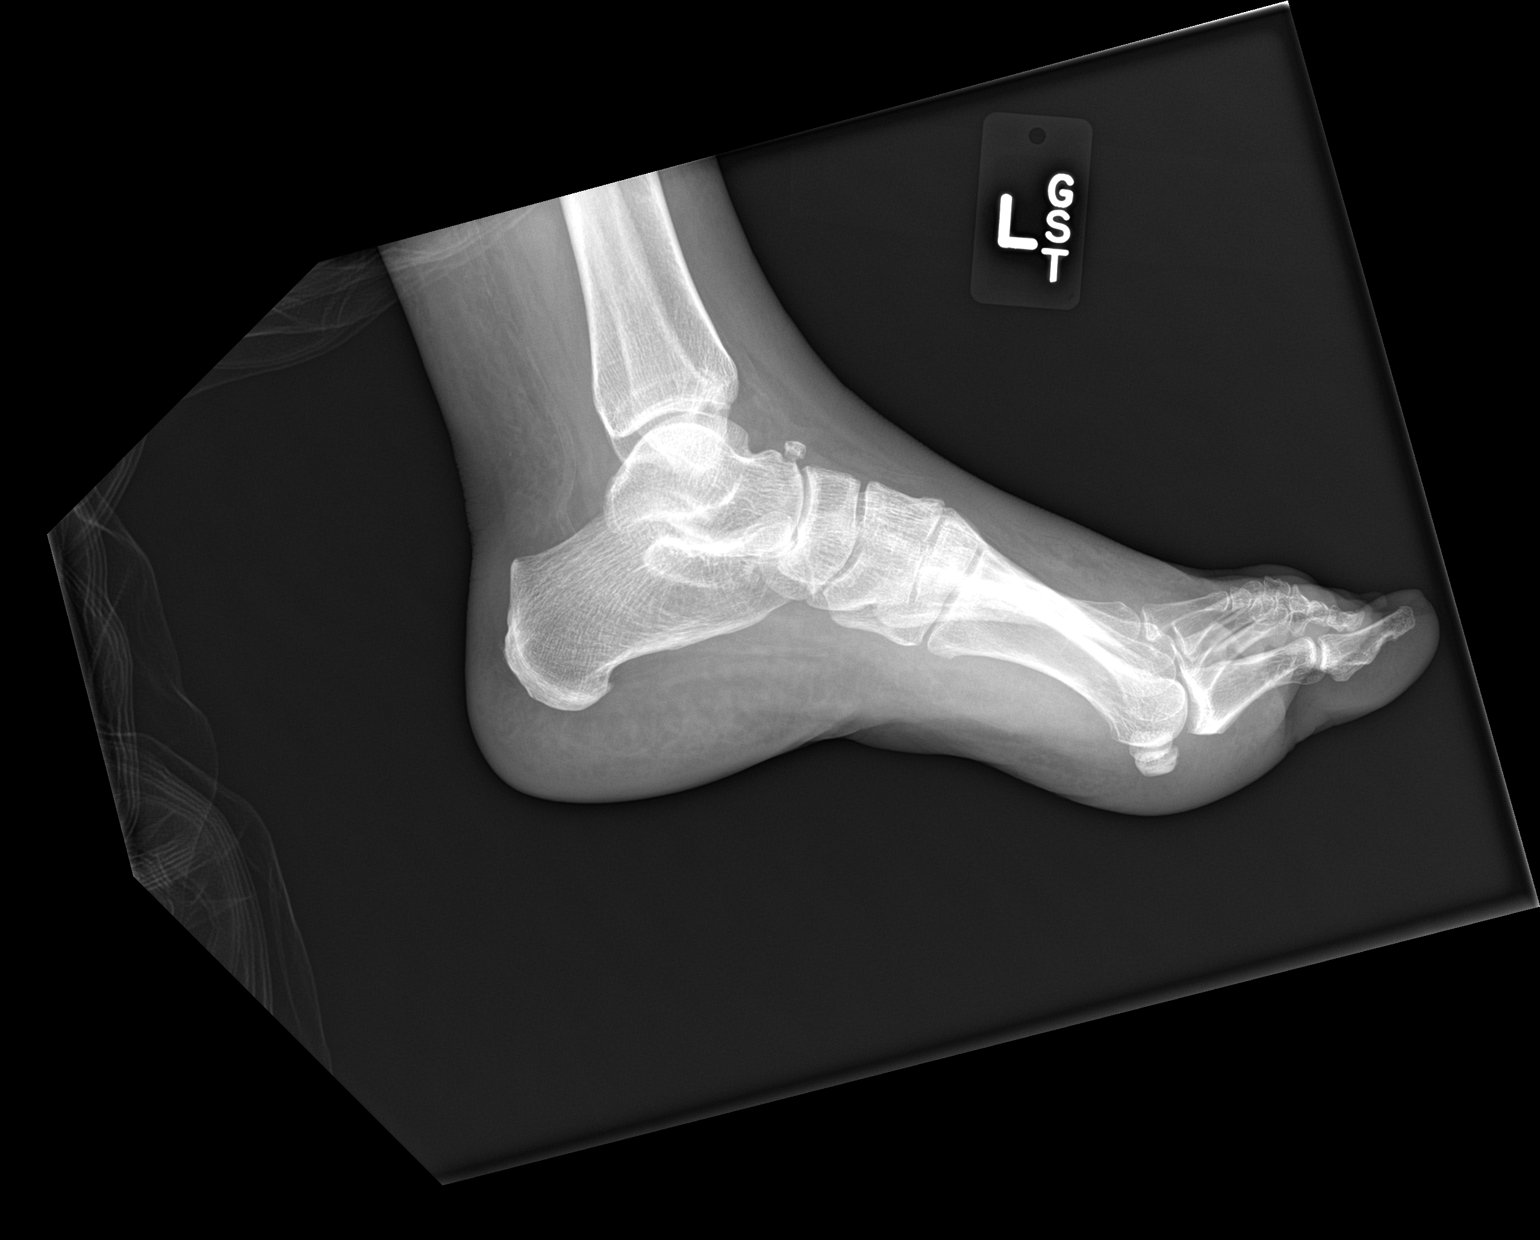

[3 of 3 positions shown; findings below may reference images not displayed]

FINDINGS: There is no evidence of fracture or dislocation. Joint spaces
maintained without significant degenerative or erosive arthropathy.
Tiny plantar calcaneal enthesophyte noted. Male degenerative
spurring noted at the dorsal midfoot. The Soft tissues demonstrate
no acute abnormality.
IMPRESSION: No acute osseous abnormality about the left foot.

## 2018-11-22 ENCOUNTER — Other Ambulatory Visit: Payer: Self-pay

## 2018-11-22 ENCOUNTER — Ambulatory Visit: Payer: Medicare Other | Admitting: Orthotics

## 2018-11-22 DIAGNOSIS — M2041 Other hammer toe(s) (acquired), right foot: Secondary | ICD-10-CM

## 2018-11-22 DIAGNOSIS — E1140 Type 2 diabetes mellitus with diabetic neuropathy, unspecified: Secondary | ICD-10-CM

## 2018-11-22 DIAGNOSIS — L84 Corns and callosities: Secondary | ICD-10-CM

## 2018-11-22 NOTE — Progress Notes (Addendum)
Due to feet swelling, going to get larger size shoe. Marland Kitchen

## 2018-12-03 ENCOUNTER — Other Ambulatory Visit: Payer: Self-pay | Admitting: Family Medicine

## 2018-12-03 DIAGNOSIS — N6310 Unspecified lump in the right breast, unspecified quadrant: Secondary | ICD-10-CM

## 2018-12-03 DIAGNOSIS — N644 Mastodynia: Secondary | ICD-10-CM

## 2018-12-04 ENCOUNTER — Ambulatory Visit (INDEPENDENT_AMBULATORY_CARE_PROVIDER_SITE_OTHER): Payer: Medicare Other | Admitting: Orthotics

## 2018-12-04 ENCOUNTER — Other Ambulatory Visit: Payer: Self-pay

## 2018-12-04 DIAGNOSIS — L84 Corns and callosities: Secondary | ICD-10-CM | POA: Diagnosis not present

## 2018-12-04 DIAGNOSIS — E1140 Type 2 diabetes mellitus with diabetic neuropathy, unspecified: Secondary | ICD-10-CM | POA: Diagnosis not present

## 2018-12-04 DIAGNOSIS — Z794 Long term (current) use of insulin: Secondary | ICD-10-CM

## 2018-12-04 DIAGNOSIS — M2041 Other hammer toe(s) (acquired), right foot: Secondary | ICD-10-CM | POA: Diagnosis not present

## 2018-12-04 DIAGNOSIS — M2042 Other hammer toe(s) (acquired), left foot: Secondary | ICD-10-CM | POA: Diagnosis not present

## 2018-12-04 NOTE — Progress Notes (Signed)

## 2018-12-06 ENCOUNTER — Other Ambulatory Visit: Payer: Self-pay

## 2018-12-06 ENCOUNTER — Ambulatory Visit: Payer: Medicare Other

## 2018-12-06 ENCOUNTER — Ambulatory Visit
Admission: RE | Admit: 2018-12-06 | Discharge: 2018-12-06 | Disposition: A | Discharge status: Home / Self Care | Payer: Medicare Other | Source: Ambulatory Visit | Attending: Family Medicine | Admitting: Family Medicine

## 2018-12-06 DIAGNOSIS — N6310 Unspecified lump in the right breast, unspecified quadrant: Secondary | ICD-10-CM

## 2018-12-06 DIAGNOSIS — N644 Mastodynia: Secondary | ICD-10-CM

## 2018-12-07 ENCOUNTER — Other Ambulatory Visit: Payer: Self-pay | Admitting: Family Medicine

## 2018-12-07 ENCOUNTER — Ambulatory Visit
Admission: RE | Admit: 2018-12-07 | Discharge: 2018-12-07 | Disposition: A | Discharge status: Home / Self Care | Payer: Medicare Other | Source: Ambulatory Visit | Attending: Family Medicine | Admitting: Family Medicine

## 2018-12-07 DIAGNOSIS — N644 Mastodynia: Secondary | ICD-10-CM

## 2018-12-07 DIAGNOSIS — N6310 Unspecified lump in the right breast, unspecified quadrant: Secondary | ICD-10-CM

## 2018-12-10 ENCOUNTER — Telehealth: Payer: Self-pay | Admitting: *Deleted

## 2018-12-10 NOTE — Telephone Encounter (Signed)
A message was left, re: follow up visit. 

## 2018-12-11 ENCOUNTER — Ambulatory Visit
Admission: RE | Admit: 2018-12-11 | Discharge: 2018-12-11 | Disposition: A | Discharge status: Home / Self Care | Payer: Medicare Other | Source: Ambulatory Visit | Attending: Family Medicine | Admitting: Family Medicine

## 2018-12-11 ENCOUNTER — Other Ambulatory Visit: Payer: Self-pay

## 2018-12-11 DIAGNOSIS — N6310 Unspecified lump in the right breast, unspecified quadrant: Secondary | ICD-10-CM

## 2018-12-12 ENCOUNTER — Telehealth: Payer: Self-pay | Admitting: Cardiovascular Disease

## 2018-12-12 NOTE — Telephone Encounter (Signed)
Spoke with pt who states she gained about 5 lbs over the weekend. She states she has some edema to bilat ankles and feet but not much. She states she is not SOB most of the time but can be a little SOB at times when she is ambulatory. She does state she does not get up much though. Pt denies indiscretion with salt, states she does elevate legs as much as possible and does wear her compression stockings when legs dependent position.  Pt states she is taking her torsemide, spironolactone, and metolazone as directed. Informed pt that would route message to a provider to advise. Pr verbalized understanding. Message routed to [facility], PA-C to advise

## 2018-12-12 NOTE — Telephone Encounter (Signed)
New Message    Caryl Pina from [employer] to have nurse call patient about 5lb weight gain with bilat swelling in ankles and feet.  Caryl Pina also reports patient has SOB issue as well.  Please call patient back.

## 2018-12-12 NOTE — Telephone Encounter (Signed)
I would refer this to Darrick Grinder NP- she saw the patient last and the patient is to be seen in the CHF clinic 8/3.  Kerin Ransom PA-C 12/12/2018 3:27 PM

## 2018-12-13 NOTE — Telephone Encounter (Signed)
Please call and instruct to increase lasix to 80 mg twice a day x 2 days.   Plan to check BMET next week at office visit.   Loriel Diehl NP-C  4:33 PM

## 2018-12-14 NOTE — Telephone Encounter (Signed)
Spoke with pt and verbalized understanding to increase torsemide 80mg  am/80 mg pm x2 days. Confirmed OV for next week.

## 2018-12-17 ENCOUNTER — Other Ambulatory Visit: Payer: Self-pay

## 2018-12-17 ENCOUNTER — Encounter (HOSPITAL_COMMUNITY): Payer: Self-pay

## 2018-12-17 ENCOUNTER — Ambulatory Visit (HOSPITAL_COMMUNITY)
Admission: RE | Admit: 2018-12-17 | Discharge: 2018-12-17 | Disposition: A | Discharge status: Home / Self Care | Payer: Medicare Other | Source: Ambulatory Visit | Attending: Internal Medicine | Admitting: Internal Medicine

## 2018-12-17 VITALS — BP 118/66 | HR 83 | Wt 224.0 lb

## 2018-12-17 DIAGNOSIS — G4733 Obstructive sleep apnea (adult) (pediatric): Secondary | ICD-10-CM

## 2018-12-17 DIAGNOSIS — H5440 Blindness, one eye, unspecified eye: Secondary | ICD-10-CM | POA: Diagnosis not present

## 2018-12-17 DIAGNOSIS — Z91041 Radiographic dye allergy status: Secondary | ICD-10-CM | POA: Insufficient documentation

## 2018-12-17 DIAGNOSIS — Z6841 Body Mass Index (BMI) 40.0 and over, adult: Secondary | ICD-10-CM | POA: Insufficient documentation

## 2018-12-17 DIAGNOSIS — Z8249 Family history of ischemic heart disease and other diseases of the circulatory system: Secondary | ICD-10-CM | POA: Diagnosis not present

## 2018-12-17 DIAGNOSIS — J45909 Unspecified asthma, uncomplicated: Secondary | ICD-10-CM | POA: Insufficient documentation

## 2018-12-17 DIAGNOSIS — M1990 Unspecified osteoarthritis, unspecified site: Secondary | ICD-10-CM | POA: Insufficient documentation

## 2018-12-17 DIAGNOSIS — Z79890 Hormone replacement therapy (postmenopausal): Secondary | ICD-10-CM | POA: Insufficient documentation

## 2018-12-17 DIAGNOSIS — N183 Chronic kidney disease, stage 3 unspecified: Secondary | ICD-10-CM

## 2018-12-17 DIAGNOSIS — E669 Obesity, unspecified: Secondary | ICD-10-CM | POA: Insufficient documentation

## 2018-12-17 DIAGNOSIS — E039 Hypothyroidism, unspecified: Secondary | ICD-10-CM | POA: Diagnosis not present

## 2018-12-17 DIAGNOSIS — E1140 Type 2 diabetes mellitus with diabetic neuropathy, unspecified: Secondary | ICD-10-CM | POA: Insufficient documentation

## 2018-12-17 DIAGNOSIS — E785 Hyperlipidemia, unspecified: Secondary | ICD-10-CM | POA: Insufficient documentation

## 2018-12-17 DIAGNOSIS — Z9989 Dependence on other enabling machines and devices: Secondary | ICD-10-CM

## 2018-12-17 DIAGNOSIS — I130 Hypertensive heart and chronic kidney disease with heart failure and stage 1 through stage 4 chronic kidney disease, or unspecified chronic kidney disease: Secondary | ICD-10-CM | POA: Insufficient documentation

## 2018-12-17 DIAGNOSIS — Z794 Long term (current) use of insulin: Secondary | ICD-10-CM | POA: Insufficient documentation

## 2018-12-17 DIAGNOSIS — I5032 Chronic diastolic (congestive) heart failure: Secondary | ICD-10-CM

## 2018-12-17 DIAGNOSIS — Z888 Allergy status to other drugs, medicaments and biological substances status: Secondary | ICD-10-CM | POA: Insufficient documentation

## 2018-12-17 DIAGNOSIS — Z880 Allergy status to penicillin: Secondary | ICD-10-CM | POA: Diagnosis not present

## 2018-12-17 DIAGNOSIS — Z87891 Personal history of nicotine dependence: Secondary | ICD-10-CM | POA: Insufficient documentation

## 2018-12-17 DIAGNOSIS — Z833 Family history of diabetes mellitus: Secondary | ICD-10-CM | POA: Insufficient documentation

## 2018-12-17 DIAGNOSIS — Z79899 Other long term (current) drug therapy: Secondary | ICD-10-CM | POA: Diagnosis not present

## 2018-12-17 DIAGNOSIS — R0602 Shortness of breath: Secondary | ICD-10-CM | POA: Diagnosis not present

## 2018-12-17 DIAGNOSIS — Z885 Allergy status to narcotic agent status: Secondary | ICD-10-CM | POA: Diagnosis not present

## 2018-12-17 DIAGNOSIS — Z823 Family history of stroke: Secondary | ICD-10-CM | POA: Diagnosis not present

## 2018-12-17 DIAGNOSIS — Z882 Allergy status to sulfonamides status: Secondary | ICD-10-CM | POA: Diagnosis not present

## 2018-12-17 DIAGNOSIS — I5030 Unspecified diastolic (congestive) heart failure: Secondary | ICD-10-CM | POA: Diagnosis present

## 2018-12-17 DIAGNOSIS — E1122 Type 2 diabetes mellitus with diabetic chronic kidney disease: Secondary | ICD-10-CM | POA: Diagnosis not present

## 2018-12-17 DIAGNOSIS — Z7982 Long term (current) use of aspirin: Secondary | ICD-10-CM | POA: Insufficient documentation

## 2018-12-17 LAB — BASIC METABOLIC PANEL
Anion gap: 9 (ref 5–15)
BUN: 63 mg/dL — ABNORMAL HIGH (ref 6–20)
CO2: 29 mmol/L (ref 22–32)
Calcium: 9.7 mg/dL (ref 8.9–10.3)
Chloride: 104 mmol/L (ref 98–111)
Creatinine, Ser: 1.95 mg/dL — ABNORMAL HIGH (ref 0.44–1.00)
GFR calc Af Amer: 32 mL/min — ABNORMAL LOW (ref >60)
GFR calc non Af Amer: 27 mL/min — ABNORMAL LOW (ref >60)
Glucose, Bld: 119 mg/dL — ABNORMAL HIGH (ref 70–99)
Potassium: 4.7 mmol/L (ref 3.5–5.1)
Sodium: 142 mmol/L (ref 135–145)

## 2018-12-17 LAB — BRAIN NATRIURETIC PEPTIDE: B Natriuretic Peptide: 51.7 pg/mL (ref 0.0–100.0)

## 2018-12-17 MED ORDER — TORSEMIDE 20 MG PO TABS: 80.0000 mg | ORAL_TABLET | ORAL | 3 refills | Status: DC

## 2018-12-17 NOTE — Progress Notes (Signed)
[facility]ED [facility] CLINIC  Referring Physician: Dr.Berry Primary Cardiologist: Dr. Gwenlyn Found  PCP: Dr. Ardeth Perfect HF MD: Dr Haroldine Laws  HPI: Amy [name] is a 58 y.o. female with obesity, HTN, diabetes with legal blindness, HL, OSA and diastolic HF.  She was referred by Dr. Gwenlyn Found for further evaluation and management of diastolic HF.   She had a negative Myoview 10/10/14 and a 2-D echo performed 07/06/15 that revealed normal LV/RV function and grade 2 diastolic dysfunction.  Today she returns for HF follow up. Last week she was instructed to increase torsemide to 80 mg twice a day for 2 days then go back to torsemide 30 mg/20 mg. Overall feeling fine. SOB with exertion. + Orthopnea. Using CPAP. She has been eating high sodium foods such as pizza and drinking lots of fluids.  Appetite ok. No fever or chills. Weight at home has been running 224-226 pounds. Taking all medications. Has a nurse for medication box.   03/2018 Echo EF 55-60% Grade 1 DD.   Review of systems complete and found to be negative unless listed in HPI.    Past Medical History:  Diagnosis Date  . Arthritis   . Asthma   . Blind   . Blindness and low vision    left eye glass eye,  legally blind in right eye  . Diabetes mellitus   . Diabetic neuropathy ([place])   . Hyperlipidemia   . Hypertension   . Hypothyroidism   . Spinal stenosis     Current Outpatient Medications  Medication Sig Dispense Refill  . albuterol (PROVENTIL HFA;VENTOLIN HFA) 108 (90 BASE) MCG/ACT inhaler Inhale 2 puffs into the lungs every 4 (four) hours as needed for wheezing or shortness of breath. For short    . aspirin EC 81 MG tablet Take 81 mg by mouth daily.    . B-D UF III MINI PEN NEEDLES 31G X 5 MM MISC U 1 PEN NEEDLE QID WITH EACH INJECTION    . cetirizine (ZYRTEC) 10 MG tablet Take 10 mg by mouth at bedtime.     Marland Kitchen esomeprazole (NEXIUM) 40 MG capsule Take 40 mg by mouth daily.     . fluticasone (FLONASE) 50 MCG/ACT nasal spray Place 1 spray  into both nostrils daily.     Marland Kitchen guaiFENesin (MUCINEX) 600 MG 12 hr tablet Take 1 tablet (600 mg total) by mouth 2 (two) times daily. 60 tablet 0  . HUMALOG KWIKPEN 100 UNIT/ML KiwkPen Inject 8-14 Units into the skin See admin instructions. Inject 8 units in morning, 10 units at lunch, and 14 units at dinner    . levothyroxine (SYNTHROID, LEVOTHROID) 150 MCG tablet Take 150 mcg by mouth daily before breakfast.    . Linaclotide (LINZESS) 145 MCG CAPS capsule Take 145 mcg by mouth daily as needed (constipation).     . metolazone (ZAROXOLYN) 2.5 MG tablet Take 1 tablet (2.5 mg total) by mouth daily. 30 tablet 3  . potassium chloride SA (K-DUR,KLOR-CON) 20 MEQ tablet TAKE 1 TABLET (20 MEQ TOTAL) BY MOUTH DAILY (Patient taking differently: Take 20 mEq by mouth daily. ) 65 tablet 2  . pregabalin (LYRICA) 150 MG capsule Take 150 mg by mouth 3 (three) times daily.     . simvastatin (ZOCOR) 20 MG tablet Take 20 mg by mouth daily.    Marland Kitchen spironolactone (ALDACTONE) 25 MG tablet Take 0.5 tablets (12.5 mg total) by mouth daily. 90 tablet 3  . torsemide (DEMADEX) 20 MG tablet Take 20-30 mg by  mouth See admin instructions. Take 30 mg by mouth in the morning and 20 mg in the evening    . TRESIBA FLEXTOUCH 200 UNIT/ML SOPN Inject 60 Units into the skin at bedtime.     . triamcinolone cream (KENALOG) 0.1 % Apply 1 application topically daily as needed (irritation).      No current facility-administered medications for this encounter.     Allergies  Allergen Reactions  . Penicillins Hives and Swelling    Has patient had a PCN reaction causing immediate rash, facial/tongue/throat swelling, SOB or lightheadedness with hypotension: yes- face swelling Has patient had a PCN reaction causing severe rash involving mucus membranes or skin necrosis: no Has patient had a PCN reaction that required hospitalization unknown (childhood allergy) Has patient had a PCN reaction occurring within the last 10 years: no If all of the  above answers are "NO", then may proceed with Cephalosporin use.   . Codeine Nausea Only  . Iodine-131 Hives  . Iohexol Hives    Pt developed itching and hives along with nasal congestion; needs 13 hour premeds for future studies, Onset Date: [id number]   . Lisinopril Cough  . Sulfa Antibiotics Nausea And Vomiting      Social History   Socioeconomic History  . Marital status: Single    Spouse name: Not on file  . Number of children: 2  . Years of education: 82  . Highest education level: Not on file  Occupational History  . Occupation: N/A    Comment: disabled  Social Needs  . Financial resource strain: Not on file  . Food insecurity    Worry: Not on file    Inability: Not on file  . Transportation needs    Medical: Not on file    Non-medical: Not on file  Tobacco Use  . Smoking status: Former Smoker    Types: Cigarettes    Quit date: 05/22/1992    Years since quitting: 26.5  . Smokeless tobacco: Never Used  Substance and Sexual Activity  . Alcohol use: No    Comment: quit 20 yrs ago  . Drug use: No  . Sexual activity: Not on file  Lifestyle  . Physical activity    Days per week: Not on file    Minutes per session: Not on file  . Stress: Not on file  Relationships  . Social Herbalist on phone: Not on file    Gets together: Not on file    Attends religious service: Not on file    Active member of club or organization: Not on file    Attends meetings of clubs or organizations: Not on file    Relationship status: Not on file  . Intimate partner violence    Fear of current or ex partner: Not on file    Emotionally abused: Not on file    Physically abused: Not on file    Forced sexual activity: Not on file  Other Topics Concern  . Not on file  Social History Narrative   Lives alone   caffeine drinks 2 cups of coffee a day, occasional soda    Family History  Problem Relation Age of Onset  . Diabetes Sister   . Glaucoma Sister   . Hypertension  Sister   . Stroke Brother   . Heart attack Paternal Aunt   . Breast cancer Neg Hx    Vitals:   12/17/18 1020  BP: 118/66  Pulse: 83  SpO2: 91%  Weight:  101.6 kg (224 lb)   Wt Readings from Last 3 Encounters:  12/17/18 101.6 kg (224 lb)  11/04/18 102.5 kg (225 lb 15.5 oz)  08/23/18 100.7 kg (222 lb)    ReDS Vest - 12/17/18 1000      ReDS Vest   MR   No    Fitting Posture  Sitting   station B    Height Marker  Short    Ruler Value  37    Center Strip  Aligned    ReDS Value  32       PHYSICAL EXAM: General:  Appears chronically ill.  No resp difficulty. Walked in the clinic with a rolling walker.  HEENT: normal blind left eye  Neck: supple. Hard to asess due to body habitus.  Carotids 2+ bilat; no bruits. No lymphadenopathy or thryomegaly appreciated. Cor: PMI nondisplaced. Regular rate & rhythm. No rubs, gallops or murmurs. Lungs: clear Abdomen: soft, nontender, distended. No hepatosplenomegaly. No bruits or masses. Good bowel sounds. Extremities: no cyanosis, clubbing, rash, R and LLE 1+ edema Neuro: alert & orientedx3, cranial nerves grossly intact. moves all 4 extremities w/o difficulty. Affect pleasant    ASSESSMENT & PLAN: 1. Chronic diastolic HF - Echo 1/69 Normal LV/R function Grade II DD.  ECHO 03/2018 - EF stable 55-60% RV ok   - NYHA III. Volume overloaded in the setting of high sodium diet.  - Increase torsemide to 80 mg daily.  - Continue spiro25 mg daily.  -Check BMET today and in 7 days.  2. HTN - Stable Continue current regimen.  3. DM2 - Per PCP.  - With HF would strongly consider Jardiance if renalfunction permits 4. OSA  -Continue CPAP nightly.  5. CKD III Check BMET today.   Follow up in 3 months. Discussed low salt food choices and to limit fluid intake to < 2 liters per day.    Darrick Grinder, NP  10:38 AM

## 2018-12-17 NOTE — Patient Instructions (Signed)
Labs done today. We will call you only if labs are abnormal.   INCREASE torsemide 80mg . Take 4 tablets by mouth daily. You can take an additional 20mg  if needed.  Your physician recommends that you schedule a follow-up appointment in: 1 week for lab work only and in 3 months for an office visit.   At the [facility], you and your health needs are our priority. As part of our continuing mission to provide you with exceptional heart care, we have created designated Provider Care Teams. These Care Teams include your primary Cardiologist (physician) and Advanced Practice Providers (APPs- Physician Assistants and Nurse Practitioners) who all work together to provide you with the care you need, when you need it.   You may see any of the following providers on your designated Care Team at your next follow up: Marland Kitchen Dr Glori Bickers . Dr Loralie Champagne . Darrick Grinder, NP   Please be sure to bring in all your medications bottles to every appointment.

## 2018-12-17 NOTE — Progress Notes (Signed)
ReDS Vest - 12/17/18 1000      ReDS Vest   MR   No    Fitting Posture  Sitting   station B    Height Marker  Short    Ruler Value  37    Center Strip  Aligned    ReDS Value  32

## 2018-12-25 ENCOUNTER — Ambulatory Visit (HOSPITAL_COMMUNITY)
Admission: RE | Admit: 2018-12-25 | Discharge: 2018-12-25 | Disposition: A | Discharge status: Home / Self Care | Payer: Medicare Other | Source: Ambulatory Visit | Attending: Internal Medicine | Admitting: Internal Medicine

## 2018-12-25 ENCOUNTER — Other Ambulatory Visit: Payer: Self-pay

## 2018-12-25 DIAGNOSIS — I5032 Chronic diastolic (congestive) heart failure: Secondary | ICD-10-CM

## 2018-12-25 LAB — BASIC METABOLIC PANEL
Anion gap: 11 (ref 5–15)
BUN: 63 mg/dL — ABNORMAL HIGH (ref 6–20)
CO2: 29 mmol/L (ref 22–32)
Calcium: 9.3 mg/dL (ref 8.9–10.3)
Chloride: 102 mmol/L (ref 98–111)
Creatinine, Ser: 2.21 mg/dL — ABNORMAL HIGH (ref 0.44–1.00)
GFR calc Af Amer: 27 mL/min — ABNORMAL LOW (ref >60)
GFR calc non Af Amer: 24 mL/min — ABNORMAL LOW (ref >60)
Glucose, Bld: 125 mg/dL — ABNORMAL HIGH (ref 70–99)
Potassium: 3.9 mmol/L (ref 3.5–5.1)
Sodium: 142 mmol/L (ref 135–145)

## 2018-12-25 LAB — BRAIN NATRIURETIC PEPTIDE: B Natriuretic Peptide: 41.4 pg/mL (ref 0.0–100.0)

## 2018-12-26 ENCOUNTER — Telehealth (HOSPITAL_COMMUNITY): Payer: Self-pay | Admitting: *Deleted

## 2018-12-26 ENCOUNTER — Other Ambulatory Visit (HOSPITAL_COMMUNITY): Payer: Self-pay | Admitting: *Deleted

## 2018-12-26 DIAGNOSIS — I5022 Chronic systolic (congestive) heart failure: Secondary | ICD-10-CM

## 2018-12-26 MED ORDER — TORSEMIDE 20 MG PO TABS: 60.0000 mg | ORAL_TABLET | ORAL | 3 refills | Status: DC

## 2018-12-26 NOTE — Telephone Encounter (Signed)
-----   Message from Conrad Pine Air, NP sent at 12/25/2018 11:59 AM EDT ----- Creatinine elevated. Please call. Make sure she is not take metolazone everyday. Hold torsemide x2 days the start torsemide 60 mg daily. Hold potassium 2 days the restart 20 meq once a day. Repeat BMET in 7 days.

## 2018-12-26 NOTE — Telephone Encounter (Signed)
Notes recorded by Harvie Junior, CMA on 12/26/2018 at 9:05 AM EDT  Called and spoke with nurse aide Roderic Ovens discussed medication changes. Roderic Ovens asked me to call RN Edwena Felty at [phone] and give her medication changes. Edwena Felty, RN then asked me to fax an order to [facility] at [phone] for "prn medication changes as directed by the CHF clinic". Order faxed medications updated in patients chart.  ------   Notes recorded by Kerry Dory, CMA on 12/25/2018 at 4:07 PM EDT  Patient aware. And voiced understanding however would like results to be reviewed with nurse aide, Kerry Fort. She will be available 8/12 from 8:30a-2:00p  Will return call at that time  ------   Notes recorded by Darrick Grinder D, NP on 12/25/2018 at 11:59 AM EDT  Creatinine elevated. Please call. Make sure she is not take metolazone everyday. Hold torsemide x2 days the start torsemide 60 mg daily. Hold potassium 2 days the restart 20 meq once a day. Repeat BMET in 7 days.

## 2019-01-02 ENCOUNTER — Other Ambulatory Visit: Payer: Self-pay

## 2019-01-02 ENCOUNTER — Ambulatory Visit (HOSPITAL_COMMUNITY)
Admission: RE | Admit: 2019-01-02 | Discharge: 2019-01-02 | Disposition: A | Discharge status: Home / Self Care | Payer: Medicare Other | Source: Ambulatory Visit | Attending: Internal Medicine | Admitting: Internal Medicine

## 2019-01-02 DIAGNOSIS — I5022 Chronic systolic (congestive) heart failure: Secondary | ICD-10-CM | POA: Diagnosis not present

## 2019-01-02 LAB — BASIC METABOLIC PANEL
Anion gap: 10 (ref 5–15)
BUN: 56 mg/dL — ABNORMAL HIGH (ref 6–20)
CO2: 30 mmol/L (ref 22–32)
Calcium: 9.5 mg/dL (ref 8.9–10.3)
Chloride: 105 mmol/L (ref 98–111)
Creatinine, Ser: 2 mg/dL — ABNORMAL HIGH (ref 0.44–1.00)
GFR calc Af Amer: 31 mL/min — ABNORMAL LOW (ref >60)
GFR calc non Af Amer: 27 mL/min — ABNORMAL LOW (ref >60)
Glucose, Bld: 58 mg/dL — ABNORMAL LOW (ref 70–99)
Potassium: 4.6 mmol/L (ref 3.5–5.1)
Sodium: 145 mmol/L (ref 135–145)

## 2019-01-04 ENCOUNTER — Telehealth (HOSPITAL_COMMUNITY): Payer: Self-pay | Admitting: *Deleted

## 2019-01-04 NOTE — Telephone Encounter (Signed)
Increase torsemide to 80 mg daily.   Need BMET in 7 days.   Rakeb Kibble NP-C  1:21 PM

## 2019-01-04 NOTE — Telephone Encounter (Signed)
Called pt no answer no vm. Will try patient again.

## 2019-01-04 NOTE — Telephone Encounter (Signed)
Pt called reporting weight is up 5lbs in a week and she has swelling in her knees. Pt denies sob, chest pain, and fatigue. Pt states when she was on torsemide 80mg  her fluid was better controlled she said 60mg  of torsemide is not enough.   Routed to Darrick Grinder, NP for advice

## 2019-01-06 ENCOUNTER — Other Ambulatory Visit: Payer: Self-pay

## 2019-01-06 ENCOUNTER — Ambulatory Visit (HOSPITAL_COMMUNITY)
Admission: EM | Admit: 2019-01-06 | Discharge: 2019-01-06 | Disposition: A | Discharge status: Home / Self Care | Payer: Medicare Other | Attending: Family Medicine | Admitting: Family Medicine

## 2019-01-06 ENCOUNTER — Encounter (HOSPITAL_COMMUNITY): Payer: Self-pay

## 2019-01-06 DIAGNOSIS — M436 Torticollis: Secondary | ICD-10-CM | POA: Diagnosis not present

## 2019-01-06 MED ORDER — CYCLOBENZAPRINE HCL 10 MG PO TABS: 10.0000 mg | ORAL_TABLET | Freq: Two times a day (BID) | ORAL | 0 refills | Status: DC | PRN

## 2019-01-06 NOTE — ED Provider Notes (Signed)
[place]    CSN: [id number] Arrival date & time: 01/06/19  1028      History   Chief Complaint Chief Complaint  Patient presents with  . Neck Pain    HPI Amy [name] is a 58 y.o. female with history of blindness, arthritis, diabetes, spinal stenosis presenting with her sister for left-sided neck pain.  Patient states that she uses a hospital bed, tried pulling herself up to 3 days ago and has been sore since.  Patient denies pop/snap/tearing sensation, trauma, fall.  No headache, upper extremity paresthesias, weakness, pain.  Endorsing decreased range of motion, specifically when turning her head to left.   Past Medical History:  Diagnosis Date  . Arthritis   . Asthma   . Blind   . Blindness and low vision    left eye glass eye,  legally blind in right eye  . Diabetes mellitus   . Diabetic neuropathy ([place])   . Hyperlipidemia   . Hypertension   . Hypothyroidism   . Spinal stenosis     Patient Active Problem List   Diagnosis Date Noted  . Septic bursitis of elbow, right 10/30/2018  . CKD (chronic kidney disease), stage III ([place]) 07/26/2018  . Morbid obesity ([place]) 07/26/2018  . Intractable nausea and vomiting 03/06/2018  . Right leg pain 12/19/2017  . Leukocytosis 12/19/2017  . Benign positional vertigo 12/19/2017  . Weakness 12/12/2017  . Laryngopharyngeal reflux (LPR) 10/19/2017  . Post-nasal drainage 10/19/2017  . Acute sinusitis 10/19/2017  . Degeneration of lumbar intervertebral disc 07/29/2017  . 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 Obstructive sleep apnea on CPAP 02/09/2017  . Chronic diastolic heart failure ([place]) 11/08/2016  . Near syncope 01/30/2016  . SOB (shortness of breath)   . CAP (community acquired pneumonia) 09/12/2015  . Hypoxia 09/12/2015  . Essential hypertension 07/05/2015  . Hypotension 07/05/2015  . Diabetes mellitus  with neurological manifestations ([place]) 11/04/2014  . Hyperlipidemia LDL goal <70 11/04/2014  . Spinal stenosis, multilevel 11/04/2014  . Hyperkalemia 11/01/2014  . Hypoglycemia 08/09/2014  . Insulin dependent diabetes mellitus with complications ([place]) 0000000  . Elevated troponin 08/08/2014  . Nausea vomiting and diarrhea 08/08/2014  . 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 Dizziness 08/06/2012  . Orthostatic hypotension 08/06/2012  . Hypernatremia 08/06/2012  . Acute gastroenteritis 08/13/2011  . Gastroparesis 08/13/2011  . Low back pain 08/13/2011  . Oral thrush 08/13/2011  . Gastroenteritis 05/23/2011  . Dehydration 05/23/2011  . Blindness 05/23/2011  . DM type 1, not at goal, causing eye disease ([place]) 05/23/2011  . Asthma 05/23/2011  . Diarrhea 05/23/2011  . Vomiting 05/23/2011  . Hypothyroidism 05/23/2011  . Diabetic neuropathy ([place]) 05/23/2011  . Diabetic nephropathy ([place]) 05/23/2011    Past Surgical History:  Procedure Laterality Date  . ABDOMINAL HYSTERECTOMY  1990  . [place]   x 2  . CHOLECYSTECTOMY  2008  . ENUCLEATION Bilateral 09/15/1998  . EYE SURGERY  2016   fitting artificial eye    OB History   No obstetric history on file.    Obstetric Comments  Pt has two sons.         Home Medications    Prior to Admission medications   Medication Sig Start Date End Date Taking? Authorizing Provider  albuterol (PROVENTIL HFA;VENTOLIN HFA) 108 (90 BASE) MCG/ACT inhaler Inhale 2  puffs into the lungs every 4 (four) hours as needed for wheezing or shortness of breath. For short    [provider]  aspirin EC 81 MG tablet Take 81 mg by mouth daily.    [provider]  B-D UF III MINI PEN NEEDLES 31G X 5 MM MISC U 1 PEN NEEDLE QID WITH EACH INJECTION 08/01/18   [provider]  cetirizine (ZYRTEC) 10 MG tablet Take 10 mg by mouth at bedtime.  05/31/18   [provider]  cyclobenzaprine (FLEXERIL) 10 MG tablet Take 1 tablet (10 mg total) by mouth 2 (two) times daily as needed for muscle spasms. 01/06/19   Hall-Potvin, [place], PA-C  esomeprazole (NEXIUM) 40 MG capsule Take 40 mg by mouth daily.     [provider]  fluticasone (FLONASE) 50 MCG/ACT nasal spray Place 1 spray into both nostrils daily.     [provider]  guaiFENesin (MUCINEX) 600 MG 12 hr tablet Take 1 tablet (600 mg total) by mouth 2 (two) times daily. 07/09/18   Lendon Colonel, NP  HUMALOG KWIKPEN 100 UNIT/ML KiwkPen Inject 8-14 Units into the skin See admin instructions. Inject 8 units in morning, 10 units at lunch, and 14 units at dinner 01/24/17   [provider]  levothyroxine (SYNTHROID, LEVOTHROID) 150 MCG tablet Take 150 mcg by mouth daily before breakfast.    [provider]  Linaclotide (LINZESS) 145 MCG CAPS capsule Take 145 mcg by mouth daily as needed (constipation).     [provider]  metolazone (ZAROXOLYN) 2.5 MG tablet Take 1 tablet (2.5 mg total) by mouth daily. 07/26/18   Erlene Quan, PA-C  potassium chloride SA (K-DUR,KLOR-CON) 20 MEQ tablet TAKE 1 TABLET (20 MEQ TOTAL) BY MOUTH DAILY Patient taking differently: Take 20 mEq by mouth daily.  07/04/18   Bensimhon, Shaune Pascal, MD  pregabalin (LYRICA) 150 MG capsule Take 150 mg by mouth 3 (three) times daily.  01/01/18   [provider]  simvastatin (ZOCOR) 20 MG tablet Take 20 mg by mouth daily.    [provider]  spironolactone (ALDACTONE) 25 MG tablet Take 0.5 tablets (12.5 mg total) by mouth daily. 10/30/17   Shirley Friar, PA-C  torsemide (DEMADEX) 20 MG tablet Take 3 tablets (60 mg total) by mouth See admin instructions. 12/26/18   Clegg, Amy D, NP  TRESIBA FLEXTOUCH 200 UNIT/ML SOPN Inject 60 Units into the skin at bedtime.  05/30/18   [provider]  triamcinolone cream (KENALOG) 0.1 % Apply 1 application topically daily as needed  (irritation).  05/30/18   [provider]    Family History Family History  Problem Relation Age of Onset  . Diabetes Sister   . Glaucoma Sister   . Hypertension Sister   . Stroke Brother   . Heart attack Paternal Aunt   . Breast cancer Neg Hx     Social History Social History   Tobacco Use  . Smoking status: Former Smoker    Types: Cigarettes    Quit date: 05/22/1992    Years since quitting: 26.6  . Smokeless tobacco: Never Used  Substance Use Topics  . Alcohol use: No    Comment: quit 20 yrs ago  . Drug use: No     Allergies   Penicillins, Codeine, Iodine-131, Iohexol, Lisinopril, and Sulfa antibiotics   Review of Systems Review of Systems  Constitutional: Negative for fatigue and fever.  Respiratory: Negative for cough and shortness of breath.  Cardiovascular: Negative for chest pain and palpitations.  Musculoskeletal:       Positive for left-sided neck pain Negative for trauma, deformity, extremity weakness  Neurological: Negative for weakness and numbness.     Physical Exam Triage Vital Signs ED Triage Vitals  Enc Vitals Group     BP 01/06/19 1132 (!) 143/66     Pulse Rate 01/06/19 1132 88     Resp 01/06/19 1132 18     Temp 01/06/19 1132 97.7 F (36.5 C)     Temp Source 01/06/19 1132 Oral     SpO2 01/06/19 1132 98 %     Weight 01/06/19 1131 226 lb (102.5 kg)     Height --      Head Circumference --      Peak Flow --      Pain Score 01/06/19 1131 10     Pain Loc --      Pain Edu? --      Excl. in [place]? --    No data found.  Updated Vital Signs BP (!) 143/66 (BP Location: Right Arm)   Pulse 88   Temp 97.7 F (36.5 C) (Oral)   Resp 18   Wt 226 lb (102.5 kg)   SpO2 98%   BMI 45.65 kg/m   Visual Acuity Right Eye Distance:   Left Eye Distance:   Bilateral Distance:    Right Eye Near:   Left Eye Near:    Bilateral Near:     Physical Exam Constitutional:      General: She is not in acute distress. HENT:     Head:  Normocephalic and atraumatic.  Eyes:     General: No scleral icterus.    Conjunctiva/sclera: Conjunctivae normal.     Pupils: Pupils are equal, round, and reactive to light.  Cardiovascular:     Rate and Rhythm: Normal rate.  Pulmonary:     Effort: Pulmonary effort is normal. No respiratory distress.  Musculoskeletal:     Comments: No obvious bony deformity, ecchymosis, masses in C-spine.  No vertebral tenderness.  Patient is tender to left paraspinal cervical muscles as well as superior aspect of trapezius.  Patient has significantly decreased active ROM that is slightly worse when turning head to left from patient's baseline per sister.  Sensation intact.  No carotid bruit bilaterally.  Patient has full active ROM of upper extremities with 5/5 strength, 2+ DTRs bilaterally.  Neurological:     General: No focal deficit present.     Mental Status: She is alert.      UC Treatments / Results  Labs (all labs ordered are listed, but only abnormal results are displayed) Labs Reviewed - No data to display  EKG   Radiology No results found.  Procedures Procedures (including critical care time)  Medications Ordered in UC Medications - No data to display  Initial Impression / Assessment and Plan / UC Course  I have reviewed the triage vital signs and the nursing notes.  Pertinent labs & imaging results that were available during my care of the patient were reviewed by me and considered in my medical decision making (see chart for details).     1.  Torticollis History and physical consistent with torticollis, likely second to cervical strain.  Radiography deferred at this time due to extensive radiation in the past, lack of inciting/traumatic event.  Will trial muscle relaxers in conjunction with supportive management as listed below.  Return precautions discussed, patient and her sister verbalized understanding and  are agreeable to plan. Final Clinical Impressions(s) / UC Diagnoses    Final diagnoses:  Torticollis, acute     Discharge Instructions     Take muscle relaxer as needed for severe pain, spasm. May ice, rest, elevate area is causing most pain.  Can also use hot compresses/warm wash rags to relieve muscle tightness. May use OTC Tylenol, ibuprofen as needed for pain. Return if you develop worsening pain, chest pain, difficulty breathing.    ED Prescriptions    Medication Sig Dispense Auth. Provider   cyclobenzaprine (FLEXERIL) 10 MG tablet Take 1 tablet (10 mg total) by mouth 2 (two) times daily as needed for muscle spasms. 20 tablet Hall-Potvin, [place], PA-C     Controlled Substance Prescriptions [place] Controlled Substance Registry consulted? Not Applicable   Quincy Sheehan, [place] 01/06/19 1231

## 2019-01-06 NOTE — ED Triage Notes (Signed)
Pt states she has neck pain. X 2 days.

## 2019-01-06 NOTE — Discharge Instructions (Signed)
Take muscle relaxer as needed for severe pain, spasm. °May ice, rest, elevate area is causing most pain.  Can also use hot compresses/warm wash rags to relieve muscle tightness. °May use OTC Tylenol, ibuprofen as needed for pain. °Return if you develop worsening pain, chest pain, difficulty breathing. °

## 2019-01-07 ENCOUNTER — Emergency Department (HOSPITAL_COMMUNITY)
Admission: EM | Admit: 2019-01-07 | Discharge: 2019-01-08 | Disposition: A | Discharge status: Home / Self Care | Payer: Medicare Other | Attending: Emergency Medicine | Admitting: Emergency Medicine

## 2019-01-07 ENCOUNTER — Other Ambulatory Visit: Payer: Self-pay

## 2019-01-07 ENCOUNTER — Encounter (HOSPITAL_COMMUNITY): Payer: Self-pay

## 2019-01-07 DIAGNOSIS — N183 Chronic kidney disease, stage 3 (moderate): Secondary | ICD-10-CM | POA: Diagnosis not present

## 2019-01-07 DIAGNOSIS — E1022 Type 1 diabetes mellitus with diabetic chronic kidney disease: Secondary | ICD-10-CM | POA: Diagnosis not present

## 2019-01-07 DIAGNOSIS — Z794 Long term (current) use of insulin: Secondary | ICD-10-CM | POA: Diagnosis not present

## 2019-01-07 DIAGNOSIS — Z87891 Personal history of nicotine dependence: Secondary | ICD-10-CM | POA: Diagnosis not present

## 2019-01-07 DIAGNOSIS — J45909 Unspecified asthma, uncomplicated: Secondary | ICD-10-CM | POA: Diagnosis not present

## 2019-01-07 DIAGNOSIS — Z7982 Long term (current) use of aspirin: Secondary | ICD-10-CM | POA: Diagnosis not present

## 2019-01-07 DIAGNOSIS — Z79899 Other long term (current) drug therapy: Secondary | ICD-10-CM | POA: Diagnosis not present

## 2019-01-07 DIAGNOSIS — M542 Cervicalgia: Secondary | ICD-10-CM | POA: Diagnosis not present

## 2019-01-07 DIAGNOSIS — E039 Hypothyroidism, unspecified: Secondary | ICD-10-CM | POA: Insufficient documentation

## 2019-01-07 DIAGNOSIS — R609 Edema, unspecified: Secondary | ICD-10-CM | POA: Insufficient documentation

## 2019-01-07 DIAGNOSIS — I129 Hypertensive chronic kidney disease with stage 1 through stage 4 chronic kidney disease, or unspecified chronic kidney disease: Secondary | ICD-10-CM | POA: Diagnosis not present

## 2019-01-07 LAB — BASIC METABOLIC PANEL
Anion gap: 11 (ref 5–15)
BUN: 49 mg/dL — ABNORMAL HIGH (ref 6–20)
CO2: 26 mmol/L (ref 22–32)
Calcium: 9.4 mg/dL (ref 8.9–10.3)
Chloride: 105 mmol/L (ref 98–111)
Creatinine, Ser: 1.94 mg/dL — ABNORMAL HIGH (ref 0.44–1.00)
GFR calc Af Amer: 32 mL/min — ABNORMAL LOW (ref >60)
GFR calc non Af Amer: 28 mL/min — ABNORMAL LOW (ref >60)
Glucose, Bld: 239 mg/dL — ABNORMAL HIGH (ref 70–99)
Potassium: 4.2 mmol/L (ref 3.5–5.1)
Sodium: 142 mmol/L (ref 135–145)

## 2019-01-07 LAB — CBC
HCT: 39.7 % (ref 36.0–46.0)
Hemoglobin: 12.3 g/dL (ref 12.0–15.0)
MCH: 25.6 pg — ABNORMAL LOW (ref 26.0–34.0)
MCHC: 31 g/dL (ref 30.0–36.0)
MCV: 82.7 fL (ref 80.0–100.0)
Platelets: 155 10*3/uL (ref 150–400)
RBC: 4.8 MIL/uL (ref 3.87–5.11)
RDW: 15.9 % — ABNORMAL HIGH (ref 11.5–15.5)
WBC: 11.1 10*3/uL — ABNORMAL HIGH (ref 4.0–10.5)
nRBC: 0 % (ref 0.0–0.2)

## 2019-01-07 MED ORDER — SODIUM CHLORIDE 0.9% FLUSH: 3.0000 mL | Freq: Once | INTRAVENOUS | Status: DC

## 2019-01-07 MED ORDER — IBUPROFEN 400 MG PO TABS
400.0000 mg | ORAL_TABLET | Freq: Once | ORAL | Status: AC | PRN
  Administered 2019-01-07: 400 mg via ORAL
  Filled 2019-01-07: qty 1

## 2019-01-07 NOTE — Telephone Encounter (Signed)
Called pt again no VM set up. Will try patient again later.

## 2019-01-07 NOTE — ED Triage Notes (Signed)
Pt reports right sided face and neck pain and swelling for 2 days as well as bilateral leg swelling.

## 2019-01-08 ENCOUNTER — Emergency Department (HOSPITAL_COMMUNITY): Payer: Medicare Other

## 2019-01-08 DIAGNOSIS — M542 Cervicalgia: Secondary | ICD-10-CM | POA: Diagnosis not present

## 2019-01-08 LAB — TROPONIN I (HIGH SENSITIVITY)
Troponin I (High Sensitivity): 8 ng/L (ref <18)
Troponin I (High Sensitivity): 9 ng/L (ref <18)

## 2019-01-08 LAB — BRAIN NATRIURETIC PEPTIDE: B Natriuretic Peptide: 61.8 pg/mL (ref 0.0–100.0)

## 2019-01-08 MED ORDER — LIDOCAINE 5 % EX PTCH
1.0000 | MEDICATED_PATCH | CUTANEOUS | Status: DC
  Administered 2019-01-08: 1 via TRANSDERMAL
  Filled 2019-01-08: qty 1

## 2019-01-08 NOTE — ED Notes (Signed)
Patient verbalizes understanding of discharge instructions. Opportunity for questioning and answers were provided. Armband removed by staff, pt discharged from ED via wheelchair.  

## 2019-01-08 NOTE — ED Notes (Signed)
Patient transported to X-ray 

## 2019-01-08 NOTE — ED Provider Notes (Signed)
[facility] EMERGENCY DEPARTMENT Provider Note   CSN: [id number] Arrival date & time: 01/07/19  1800     History   Chief Complaint Chief Complaint  Patient presents with  . Neck Pain  . Leg Swelling    HPI Amy [name] is a 58 y.o. female.     Patient presents to the emergency department with a chief complaint of neck pain and leg swelling.  These are 2 separate complaints.  Regarding the neck pain, she states she has been having it for the past couple of days.  She was seen in urgent care, was diagnosed with torticollis.  She reports increased pain when she bends her neck laterally.  She denies any fever, chills, redness, or rash about the neck.  Denies any difficulty breathing or swallowing.  She states she has taken a muscle relaxer with some relief.  Regarding her leg swelling, this is not a new problem for her.  She takes furosemide for this daily.  She denies any new shortness of breath, cough, or chest pain.  The history is provided by the patient. No language interpreter was used.    Past Medical History:  Diagnosis Date  . Arthritis   . Asthma   . Blind   . Blindness and low vision    left eye glass eye,  legally blind in right eye  . Diabetes mellitus   . Diabetic neuropathy ([place])   . Hyperlipidemia   . Hypertension   . Hypothyroidism   . Spinal stenosis     Patient Active Problem List   Diagnosis Date Noted  . Septic bursitis of elbow, right 10/30/2018  . CKD (chronic kidney disease), stage III ([place]) 07/26/2018  . Morbid obesity ([place]) 07/26/2018  . Intractable nausea and vomiting 03/06/2018  . Right leg pain 12/19/2017  . Leukocytosis 12/19/2017  . Benign positional vertigo 12/19/2017  . Weakness 12/12/2017  . Laryngopharyngeal reflux (LPR) 10/19/2017  . Post-nasal drainage 10/19/2017  . Acute sinusitis 10/19/2017  . Degeneration of lumbar intervertebral disc 07/29/2017  . 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 Displacement of prosthetic  orbit of left eye 03/08/2017  . 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 Obstructive sleep apnea on CPAP 02/09/2017  . Chronic diastolic heart failure ([place]) 11/08/2016  . Near syncope 01/30/2016  . SOB (shortness of breath)   . CAP (community acquired pneumonia) 09/12/2015  . Hypoxia 09/12/2015  . Essential hypertension 07/05/2015  . Hypotension 07/05/2015  . Diabetes mellitus with neurological manifestations ([place]) 11/04/2014  . Hyperlipidemia LDL goal <70 11/04/2014  . Spinal stenosis, multilevel 11/04/2014  . Hyperkalemia 11/01/2014  . Hypoglycemia 08/09/2014  . Insulin dependent diabetes mellitus with complications ([place]) 0000000  . Elevated troponin 08/08/2014  . Nausea vomiting and diarrhea 08/08/2014  . 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 Dizziness 08/06/2012  . Orthostatic hypotension 08/06/2012  . Hypernatremia 08/06/2012  . Acute gastroenteritis 08/13/2011  . Gastroparesis 08/13/2011  . Low back pain 08/13/2011  . Oral thrush 08/13/2011  . Gastroenteritis 05/23/2011  . Dehydration 05/23/2011  . Blindness 05/23/2011  . DM type 1, not at goal, causing eye disease ([place]) 05/23/2011  . Asthma 05/23/2011  . Diarrhea 05/23/2011  . Vomiting 05/23/2011  . Hypothyroidism 05/23/2011  . Diabetic neuropathy ([place]) 05/23/2011  . Diabetic nephropathy ([place]) 05/23/2011    Past Surgical History:  Procedure Laterality Date  . ABDOMINAL HYSTERECTOMY  1990  . [place]   x  2  . CHOLECYSTECTOMY  2008  . ENUCLEATION Bilateral 09/15/1998  . EYE SURGERY  2016   fitting artificial eye     OB History   No obstetric history on file.    Obstetric Comments  Pt has two sons.         Home Medications    Prior to Admission medications   Medication Sig Start Date End Date Taking? Authorizing Provider  albuterol (PROVENTIL HFA;VENTOLIN HFA) 108 (90 BASE) MCG/ACT inhaler Inhale 2 puffs into the lungs  every 4 (four) hours as needed for wheezing or shortness of breath. For short   Yes [provider]  aspirin EC 81 MG tablet Take 81 mg by mouth daily.   Yes [provider]  cetirizine (ZYRTEC) 10 MG tablet Take 10 mg by mouth at bedtime.  05/31/18  Yes [provider]  cyclobenzaprine (FLEXERIL) 10 MG tablet Take 1 tablet (10 mg total) by mouth 2 (two) times daily as needed for muscle spasms. 01/06/19  Yes Hall-Potvin, [place], PA-C  esomeprazole (NEXIUM) 40 MG capsule Take 40 mg by mouth daily.    Yes [provider]  fluticasone (FLONASE) 50 MCG/ACT nasal spray Place 1 spray into both nostrils daily.    Yes [provider]  guaiFENesin (MUCINEX) 600 MG 12 hr tablet Take 1 tablet (600 mg total) by mouth 2 (two) times daily. 07/09/18  Yes Lendon Colonel, NP  HUMALOG KWIKPEN 100 UNIT/ML KiwkPen Inject 8-14 Units into the skin See admin instructions. Inject 8 units in morning, 12 units at lunch, and 16 units at dinner 01/24/17  Yes [provider]  levothyroxine (SYNTHROID, LEVOTHROID) 150 MCG tablet Take 150 mcg by mouth daily before breakfast.   Yes [provider]  Linaclotide (LINZESS) 145 MCG CAPS capsule Take 145 mcg by mouth daily as needed (constipation).    Yes [provider]  metolazone (ZAROXOLYN) 2.5 MG tablet Take 1 tablet (2.5 mg total) by mouth daily. 07/26/18  Yes Kilroy, Luke K, PA-C  potassium chloride SA (K-DUR,KLOR-CON) 20 MEQ tablet TAKE 1 TABLET (20 MEQ TOTAL) BY MOUTH DAILY Patient taking differently: Take 20 mEq by mouth daily.  07/04/18  Yes Bensimhon, Shaune Pascal, MD  pregabalin (LYRICA) 150 MG capsule Take 150 mg by mouth 3 (three) times daily.  01/01/18  Yes [provider]  simvastatin (ZOCOR) 20 MG tablet Take 20 mg by mouth every evening.    Yes [provider]  spironolactone (ALDACTONE) 25 MG tablet Take 0.5 tablets (12.5 mg total) by mouth daily. 10/30/17  Yes Shirley Friar, PA-C  torsemide (DEMADEX) 20 MG tablet Take 3 tablets (60 mg total) by mouth See admin instructions. 12/26/18  Yes Clegg, Amy D, NP  TRESIBA FLEXTOUCH 200 UNIT/ML SOPN Inject 60 Units into the skin at bedtime.  05/30/18  Yes [provider]  triamcinolone cream (KENALOG) 0.1 % Apply 1 application topically daily as needed (irritation).  05/30/18  Yes [provider]  B-D UF III MINI PEN NEEDLES 31G X 5 MM MISC U 1 PEN NEEDLE QID WITH EACH INJECTION 08/01/18   [provider]    Family History Family History  Problem Relation Age of Onset  . Diabetes Sister   . Glaucoma Sister   . Hypertension Sister   . Stroke Brother   . Heart attack Paternal Aunt   . Breast cancer Neg Hx     Social History Social History   Tobacco Use  . Smoking status: Former Smoker  Types: Cigarettes    Quit date: 05/22/1992    Years since quitting: 26.6  . Smokeless tobacco: Never Used  Substance Use Topics  . Alcohol use: No    Comment: quit 20 yrs ago  . Drug use: No     Allergies   Penicillins, Vicodin [hydrocodone-acetaminophen], Codeine, Iodine-131, Iohexol, Lisinopril, and Sulfa antibiotics   Review of Systems Review of Systems  All other systems reviewed and are negative.    Physical Exam Updated Vital Signs BP 118/77   Pulse 60   Temp 97.7 F (36.5 C) (Oral)   Resp 12   SpO2 99%   Physical Exam Vitals signs and nursing note reviewed.  Constitutional:      General: She is not in acute distress.    Appearance: She is well-developed.  HENT:     Head: Normocephalic and atraumatic.     Comments: Oropharynx is clear Eyes:     Conjunctiva/sclera: Conjunctivae normal.  Neck:     Musculoskeletal: Neck supple.     Comments: Tenderness palpation over the right lateral neck and right upper trapezius, no cervical spine tenderness, no erythema, no rash, no evidence of abscess, no contusion or hematoma. Cardiovascular:     Rate and Rhythm: Normal rate and  regular rhythm.     Heart sounds: No murmur.  Pulmonary:     Effort: Pulmonary effort is normal. No respiratory distress.     Breath sounds: Normal breath sounds.  Abdominal:     Palpations: Abdomen is soft.     Tenderness: There is no abdominal tenderness.  Musculoskeletal:     Comments: Bilateral pitting edema  Skin:    General: Skin is warm and dry.  Neurological:     Mental Status: She is alert and oriented to person, place, and time.  Psychiatric:        Mood and Affect: Mood normal.        Behavior: Behavior normal.      ED Treatments / Results  Labs (all labs ordered are listed, but only abnormal results are displayed) Labs Reviewed  BASIC METABOLIC PANEL - Abnormal; Notable for the following components:      Result Value   Glucose, Bld 239 (*)    BUN 49 (*)    Creatinine, Ser 1.94 (*)    GFR calc non Af Amer 28 (*)    GFR calc Af Amer 32 (*)    All other components within normal limits  CBC - Abnormal; Notable for the following components:   WBC 11.1 (*)    MCH 25.6 (*)    RDW 15.9 (*)    All other components within normal limits  BRAIN NATRIURETIC PEPTIDE  TROPONIN I (HIGH SENSITIVITY)  TROPONIN I (HIGH SENSITIVITY)    EKG None  Radiology Dg Chest 2 View  Result Date: 01/08/2019 CLINICAL DATA:  Bilateral lower extremity swelling EXAM: CHEST - 2 VIEW COMPARISON:  10/30/2018 FINDINGS: The heart size and mediastinal contours are within normal limits. Both lungs are clear. The visualized skeletal structures are unremarkable. IMPRESSION: No active cardiopulmonary disease. Electronically Signed   By: Ulyses Jarred M.D.   On: 01/08/2019 03:50   Ct Cervical Spine Wo Contrast  Result Date: 01/08/2019 CLINICAL DATA:  Cervical stenosis. Right-sided neck pain and swelling EXAM: CT CERVICAL SPINE WITHOUT CONTRAST TECHNIQUE: Multidetector CT imaging of the cervical spine was performed without intravenous contrast. Multiplanar CT image reconstructions were also  generated. COMPARISON:  12/12/2017 cervical MRI FINDINGS: Alignment: No acute malalignment. Skull base  and vertebrae: Negative for acute fracture, erosion, or bone lesion. Soft tissues and spinal canal: History of swelling with no evidence of edema or mass. Disc levels: Diffuse idiopathic skeletal hyperostosis with bulky ventral spurring. Osteophytes are bridging from C3-C7. there is also posterior ridging and posterior longitudinal ligament ossification contacting the ventral cord, most notable at C2-3. Mild-to-moderate foraminal narrowings. Upper chest: Negative IMPRESSION: 1. No acute finding. 2. 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 3. 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 No evident change from a 2019 cervical MRI. Electronically Signed   By: Monte Fantasia M.D.   On: 01/08/2019 05:17    Procedures Procedures (including critical care time)  Medications Ordered in ED Medications  sodium chloride flush (NS) 0.9 % injection 3 mL (has no administration in time range)  lidocaine (LIDODERM) 5 % 1 patch (1 patch Transdermal Patch Applied 01/08/19 0432)  ibuprofen (ADVIL) tablet 400 mg (400 mg Oral Given 01/07/19 2226)     Initial Impression / Assessment and Plan / ED Course  I have reviewed the triage vital signs and the nursing notes.  Pertinent labs & imaging results that were available during my care of the patient were reviewed by me and considered in my medical decision making (see chart for details).        Patient with 2 problems today.  1.  Neck pain: Has been ongoing for the past couple of days.  Seems to be musculoskeletal.  CT shows chronic changes, but nothing acute.  The upper trapezius paraspinal muscles are tender to palpation, there is no evidence of abscess or infection.  There is no rash.  Recommend physical therapy, and PCP follow-up.  2.   Peripheral edema: This not a new problem for the patient.  She has this persistently.  She takes furosemide.  She has no evidence of fluid overload on chest x-ray.  BNP is normal.  She is breathing easily.  Recommend continuing taking lasix and PCP follow-up.  Final Clinical Impressions(s) / ED Diagnoses   Final diagnoses:  Neck pain  Peripheral edema    ED Discharge Orders    None       Montine Circle, PA-C 01/08/19 [id number]    Ezequiel Essex, MD 01/09/19 1921

## 2019-01-08 NOTE — Discharge Instructions (Addendum)
Ask your doctor about a referral to a physical therapist for your neck pain.  Continue using the muscle relaxer at home.  You can also apply ice and heat.  Return for new or worsening symptoms.

## 2019-01-11 ENCOUNTER — Ambulatory Visit (INDEPENDENT_AMBULATORY_CARE_PROVIDER_SITE_OTHER): Payer: Medicare Other | Admitting: Podiatry

## 2019-01-11 ENCOUNTER — Other Ambulatory Visit: Payer: Self-pay

## 2019-01-11 ENCOUNTER — Encounter: Payer: Self-pay | Admitting: Podiatry

## 2019-01-11 DIAGNOSIS — B351 Tinea unguium: Secondary | ICD-10-CM | POA: Diagnosis not present

## 2019-01-11 DIAGNOSIS — L84 Corns and callosities: Secondary | ICD-10-CM | POA: Diagnosis not present

## 2019-01-11 DIAGNOSIS — E1142 Type 2 diabetes mellitus with diabetic polyneuropathy: Secondary | ICD-10-CM | POA: Diagnosis not present

## 2019-01-11 DIAGNOSIS — M79676 Pain in unspecified toe(s): Secondary | ICD-10-CM | POA: Diagnosis not present

## 2019-01-11 NOTE — Patient Instructions (Signed)
Diabetes Mellitus and Foot Care Foot care is an important part of your health, especially when you have diabetes. Diabetes may cause you to have problems because of poor blood flow (circulation) to your feet and legs, which can cause your skin to:  Become thinner and drier.  Break more easily.  Heal more slowly.  Peel and crack. You may also have nerve damage (neuropathy) in your legs and feet, causing decreased feeling in them. This means that you may not notice minor injuries to your feet that could lead to more serious problems. Noticing and addressing any potential problems early is the best way to prevent future foot problems. How to care for your feet Foot hygiene  Wash your feet daily with warm water and mild soap. Do not use hot water. Then, pat your feet and the areas between your toes until they are completely dry. Do not soak your feet as this can dry your skin.  Trim your toenails straight across. Do not dig under them or around the cuticle. File the edges of your nails with an emery board or nail file.  Apply a moisturizing lotion or petroleum jelly to the skin on your feet and to dry, brittle toenails. Use lotion that does not contain alcohol and is unscented. Do not apply lotion between your toes. Shoes and socks  Wear clean socks or stockings every day. Make sure they are not too tight. Do not wear knee-high stockings since they may decrease blood flow to your legs.  Wear shoes that fit properly and have enough cushioning. Always look in your shoes before you put them on to be sure there are no objects inside.  To break in new shoes, wear them for just a few hours a day. This prevents injuries on your feet. Wounds, scrapes, corns, and calluses  Check your feet daily for blisters, cuts, bruises, sores, and redness. If you cannot see the bottom of your feet, use a mirror or ask someone for help.  Do not cut corns or calluses or try to remove them with medicine.  If you  find a minor scrape, cut, or break in the skin on your feet, keep it and the skin around it clean and dry. You may clean these areas with mild soap and water. Do not clean the area with peroxide, alcohol, or iodine.  If you have a wound, scrape, corn, or callus on your foot, look at it several times a day to make sure it is healing and not infected. Check for: ? Redness, swelling, or pain. ? Fluid or blood. ? Warmth. ? Pus or a bad smell. General instructions  Do not cross your legs. This may decrease blood flow to your feet.  Do not use heating pads or hot water bottles on your feet. They may burn your skin. If you have lost feeling in your feet or legs, you may not know this is happening until it is too late.  Protect your feet from hot and cold by wearing shoes, such as at the beach or on hot pavement.  Schedule a complete foot exam at least once a year (annually) or more often if you have foot problems. If you have foot problems, report any cuts, sores, or bruises to your health care provider immediately. Contact a health care provider if:  You have a medical condition that increases your risk of infection and you have any cuts, sores, or bruises on your feet.  You have an injury that is not   healing.  You have redness on your legs or feet.  You feel burning or tingling in your legs or feet.  You have pain or cramps in your legs and feet.  Your legs or feet are numb.  Your feet always feel cold.  You have pain around a toenail. Get help right away if:  You have a wound, scrape, corn, or callus on your foot and: ? You have pain, swelling, or redness that gets worse. ? You have fluid or blood coming from the wound, scrape, corn, or callus. ? Your wound, scrape, corn, or callus feels warm to the touch. ? You have pus or a bad smell coming from the wound, scrape, corn, or callus. ? You have a fever. ? You have a red line going up your leg. Summary  Check your feet every day  for cuts, sores, red spots, swelling, and blisters.  Moisturize feet and legs daily.  Wear shoes that fit properly and have enough cushioning.  If you have foot problems, report any cuts, sores, or bruises to your health care provider immediately.  Schedule a complete foot exam at least once a year (annually) or more often if you have foot problems. This information is not intended to replace advice given to you by your health care provider. Make sure you discuss any questions you have with your health care provider. Document Released: 04/29/2000 Document Revised: 06/14/2017 Document Reviewed: 06/03/2016 Elsevier Patient Education  2020 [employer]   Onychomycosis/Fungal Toenails  WHAT IS IT? An infection that lies within the keratin of your nail plate that is caused by a fungus.  WHY ME? Fungal infections affect all ages, sexes, races, and creeds.  There may be many factors that predispose you to a fungal infection such as age, coexisting medical conditions such as diabetes, or an autoimmune disease; stress, medications, fatigue, genetics, etc.  Bottom line: fungus thrives in a warm, moist environment and your shoes offer such a location.  IS IT CONTAGIOUS? Theoretically, yes.  You do not want to share shoes, nail clippers or files with someone who has fungal toenails.  Walking around barefoot in the same room or sleeping in the same bed is unlikely to transfer the organism.  It is important to realize, however, that fungus can spread easily from one nail to the next on the same foot.  HOW DO WE TREAT THIS?  There are several ways to treat this condition.  Treatment may depend on many factors such as age, medications, pregnancy, liver and kidney conditions, etc.  It is best to ask your doctor which options are available to you.  1. No treatment.   Unlike many other medical concerns, you can live with this condition.  However for many people this can be a painful condition and may lead to  ingrown toenails or a bacterial infection.  It is recommended that you keep the nails cut short to help reduce the amount of fungal nail. 2. Topical treatment.  These range from herbal remedies to prescription strength nail lacquers.  About 40-50% effective, topicals require twice daily application for approximately 9 to 12 months or until an entirely new nail has grown out.  The most effective topicals are medical grade medications available through physicians offices. 3. Oral antifungal medications.  With an 80-90% cure rate, the most common oral medication requires 3 to 4 months of therapy and stays in your system for a year as the new nail grows out.  Oral antifungal medications do require   blood work to make sure it is a safe drug for you.  A liver function panel will be performed prior to starting the medication and after the first month of treatment.  It is important to have the blood work performed to avoid any harmful side effects.  In general, this medication safe but blood work is required. 4. Laser Therapy.  This treatment is performed by applying a specialized laser to the affected nail plate.  This therapy is noninvasive, fast, and non-painful.  It is not covered by insurance and is therefore, out of pocket.  The results have been very good with a 80-95% cure rate.  [facility] is the only practice in the area to offer this therapy. 5. Permanent Nail Avulsion.  Removing the entire nail so that a new nail will not grow back. 

## 2019-01-20 NOTE — Progress Notes (Signed)
Subjective: Amy [name] presents to clinic for preventative foot care. She presents with cc of painful mycotic toenails and calluses which are aggravated when weightbearing with and without shoe gear.  This pain limits her daily activities. Pain symptoms resolve with periodic professional debridement.  She is requesting an earlier appointment due to toenails and calluses being tender.   Current Outpatient Medications:  .  albuterol (PROVENTIL HFA;VENTOLIN HFA) 108 (90 BASE) MCG/ACT inhaler, Inhale 2 puffs into the lungs every 4 (four) hours as needed for wheezing or shortness of breath. For short, Disp: , Rfl:  .  aspirin EC 81 MG tablet, Take 81 mg by mouth daily., Disp: , Rfl:  .  B-D UF III MINI PEN NEEDLES 31G X 5 MM MISC, U 1 PEN NEEDLE QID WITH EACH INJECTION, Disp: , Rfl:  .  cetirizine (ZYRTEC) 10 MG tablet, Take 10 mg by mouth at bedtime. , Disp: , Rfl:  .  cyclobenzaprine (FLEXERIL) 10 MG tablet, Take 1 tablet (10 mg total) by mouth 2 (two) times daily as needed for muscle spasms., Disp: 20 tablet, Rfl: 0 .  esomeprazole (NEXIUM) 40 MG capsule, Take 40 mg by mouth daily. , Disp: , Rfl:  .  fluticasone (FLONASE) 50 MCG/ACT nasal spray, Place 1 spray into both nostrils daily. , Disp: , Rfl:  .  guaiFENesin (MUCINEX) 600 MG 12 hr tablet, Take 1 tablet (600 mg total) by mouth 2 (two) times daily., Disp: 60 tablet, Rfl: 0 .  HUMALOG KWIKPEN 100 UNIT/ML KiwkPen, Inject 8-14 Units into the skin See admin instructions. Inject 8 units in morning, 12 units at lunch, and 16 units at dinner, Disp: , Rfl:  .  levothyroxine (SYNTHROID, LEVOTHROID) 150 MCG tablet, Take 150 mcg by mouth daily before breakfast., Disp: , Rfl:  .  Linaclotide (LINZESS) 145 MCG CAPS capsule, Take 145 mcg by mouth daily as needed (constipation). , Disp: , Rfl:  .  metolazone (ZAROXOLYN) 2.5 MG tablet, Take 1 tablet (2.5 mg total) by mouth daily., Disp: 30 tablet, Rfl: 3 .  potassium chloride SA (K-DUR,KLOR-CON) 20 MEQ  tablet, TAKE 1 TABLET (20 MEQ TOTAL) BY MOUTH DAILY (Patient taking differently: Take 20 mEq by mouth daily. ), Disp: 65 tablet, Rfl: 2 .  pregabalin (LYRICA) 150 MG capsule, Take 150 mg by mouth 3 (three) times daily. , Disp: , Rfl:  .  simvastatin (ZOCOR) 20 MG tablet, Take 20 mg by mouth every evening. , Disp: , Rfl:  .  spironolactone (ALDACTONE) 25 MG tablet, Take 0.5 tablets (12.5 mg total) by mouth daily., Disp: 90 tablet, Rfl: 3 .  torsemide (DEMADEX) 20 MG tablet, Take 3 tablets (60 mg total) by mouth See admin instructions., Disp: 140 tablet, Rfl: 3 .  TRESIBA FLEXTOUCH 200 UNIT/ML SOPN, Inject 60 Units into the skin at bedtime. , Disp: , Rfl:  .  triamcinolone cream (KENALOG) 0.1 %, Apply 1 application topically daily as needed (irritation). , Disp: , Rfl:    Allergies  Allergen Reactions  . Penicillins Hives and Swelling    Has patient had a PCN reaction causing immediate rash, facial/tongue/throat swelling, SOB or lightheadedness with hypotension: yes- face swelling Has patient had a PCN reaction causing severe rash involving mucus membranes or skin necrosis: no Has patient had a PCN reaction that required hospitalization unknown (childhood allergy) Has patient had a PCN reaction occurring within the last 10 years: no If all of the above answers are "NO", then may proceed with Cephalosporin use.   Marland Kitchen  Vicodin [Hydrocodone-Acetaminophen] Nausea And Vomiting  . Codeine Nausea Only  . Iodine-131 Hives  . Iohexol Hives    Pt developed itching and hives along with nasal congestion; needs 13 hour premeds for future studies, Onset Date: [id number]   . Lisinopril Cough  . Sulfa Antibiotics Nausea And Vomiting     Objective: Physical Examination:  Vascular  Examination: Capillary refill time immediate x 10 digits.  Palpable DP/PT pulses b/l.  Digital hair present b/l.  No edema noted b/l.  Skin temperature gradient WNL b/l.  Dermatological Examination: Skin with normal  turgor, texture and tone b/l.  No open wounds b/l.  No interdigital macerations noted b/l.  Elongated, thick, discolored brittle toenails with subungual debris and pain on dorsal palpation of nailbeds 1-5 b/l.  Hyperkeratotic lesions b/l heels with tenderness to palpation. No edema, no erythema, no drainage, no flocculence.  Musculoskeletal Examination: Muscle strength 5/5 to all muscle groups b/l.  Plantar fibroma stable left arch area. No erythema, no edema, no drainage, no flocculence.   Hammertoe deformity 2-5 b/l.  No pain, crepitus or joint discomfort with active/passive ROM.  Neurological Examination: Sensation diminished b/l  with 10 gram monofilament.  Assessment: 1. Mycotic nail infection with pain 1-5 b/l 2. Calluses b/l heels 3. NIDDM with neuropathy  Plan: 1. Toenails 1-5 b/l were debrided in length and girth without iatrogenic laceration. 2. Calluses pared b/l heels utilizing sterile scalpel blade without incident. 3. Continue soft, supportive shoe gear daily. 4. Report any pedal injuries to medical professional. 5. Follow up 10 weeks. 6. Patient/POA to call should there be a question/concern in there interim.

## 2019-01-27 ENCOUNTER — Encounter (HOSPITAL_COMMUNITY): Payer: Self-pay

## 2019-01-27 ENCOUNTER — Ambulatory Visit (HOSPITAL_COMMUNITY)
Admission: EM | Admit: 2019-01-27 | Discharge: 2019-01-27 | Disposition: A | Discharge status: Home / Self Care | Payer: Medicare Other | Attending: Family Medicine | Admitting: Family Medicine

## 2019-01-27 ENCOUNTER — Other Ambulatory Visit: Payer: Self-pay

## 2019-01-27 DIAGNOSIS — M659 Synovitis and tenosynovitis, unspecified: Secondary | ICD-10-CM | POA: Diagnosis not present

## 2019-01-27 MED ORDER — PREDNISONE 20 MG PO TABS: 20.0000 mg | ORAL_TABLET | Freq: Two times a day (BID) | ORAL | 0 refills | Status: DC

## 2019-01-27 MED ORDER — KETOROLAC TROMETHAMINE 30 MG/ML IJ SOLN
30.0000 mg | Freq: Once | INTRAMUSCULAR | Status: AC
  Administered 2019-01-27: 30 mg via INTRAMUSCULAR

## 2019-01-27 MED ORDER — KETOROLAC TROMETHAMINE 30 MG/ML IJ SOLN
INTRAMUSCULAR | Status: AC
  Filled 2019-01-27: qty 1

## 2019-01-27 NOTE — ED Triage Notes (Signed)
Patient presents to Urgent Care with complaints of bilateral hand pain since several weeks ago. Patient reports there is new swelling on the index finger on the right hand, denies injury or hx of gout or arthritis.  Pt statse she has not taken pain meds pta.

## 2019-01-27 NOTE — ED Provider Notes (Signed)
[place]    CSN: [id number] Arrival date & time: 01/27/19  1640      History   Chief Complaint Chief Complaint  Patient presents with  . Hand Pain    Bilateral    HPI Amy [name] is a 58 y.o. female.   HPI  Patient is here for bilateral hand pain.  Her left hand has pain when she moves her fingers and she feels a "click" consistent with trigger finger.  Her right index finger is inexplicably red swollen and very painful.  No trauma.  No injury.  No overuse.  This is never happened before.  No history of gout.  No history of arthritis. Patient does have insulin-dependent diabetes and history of diabetic neuropathy for which she takes Lyrica Poorly controlled diabetes with her most recent A1c over 10  Past Medical History:  Diagnosis Date  . Arthritis   . Asthma   . Blind   . Blindness and low vision    left eye glass eye,  legally blind in right eye  . Diabetes mellitus   . Diabetic neuropathy ([place])   . Hyperlipidemia   . Hypertension   . Hypothyroidism   . Spinal stenosis     Patient Active Problem List   Diagnosis Date Noted  . Septic bursitis of elbow, right 10/30/2018  . CKD (chronic kidney disease), stage III ([place]) 07/26/2018  . Morbid obesity ([place]) 07/26/2018  . Intractable nausea and vomiting 03/06/2018  . Right leg pain 12/19/2017  . Leukocytosis 12/19/2017  . Benign positional vertigo 12/19/2017  . Weakness 12/12/2017  . Laryngopharyngeal reflux (LPR) 10/19/2017  . Post-nasal drainage 10/19/2017  . Acute sinusitis 10/19/2017  . Degeneration of lumbar intervertebral disc 07/29/2017  . 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 Obstructive sleep apnea on CPAP 02/09/2017  . Chronic diastolic heart failure ([place]) 11/08/2016  . Near syncope 01/30/2016  . SOB (shortness of breath)   . CAP (community acquired pneumonia) 09/12/2015  . Hypoxia  09/12/2015  . Essential hypertension 07/05/2015  . Hypotension 07/05/2015  . Diabetes mellitus with neurological manifestations ([place]) 11/04/2014  . Hyperlipidemia LDL goal <70 11/04/2014  . Spinal stenosis, multilevel 11/04/2014  . Hyperkalemia 11/01/2014  . Hypoglycemia 08/09/2014  . Insulin dependent diabetes mellitus with complications ([place]) 0000000  . Elevated troponin 08/08/2014  . Nausea vomiting and diarrhea 08/08/2014  . 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 Dizziness 08/06/2012  . Orthostatic hypotension 08/06/2012  . Hypernatremia 08/06/2012  . Acute gastroenteritis 08/13/2011  . Gastroparesis 08/13/2011  . Low back pain 08/13/2011  . Oral thrush 08/13/2011  . Gastroenteritis 05/23/2011  . Dehydration 05/23/2011  . Blindness 05/23/2011  . DM type 1, not at goal, causing eye disease ([place]) 05/23/2011  . Asthma 05/23/2011  . Diarrhea 05/23/2011  . Vomiting 05/23/2011  . Hypothyroidism 05/23/2011  . Diabetic neuropathy ([place]) 05/23/2011  . Diabetic nephropathy ([place]) 05/23/2011    Past Surgical History:  Procedure Laterality Date  . ABDOMINAL HYSTERECTOMY  1990  . [place]   x 2  . CHOLECYSTECTOMY  2008  . ENUCLEATION Bilateral 09/15/1998  . EYE SURGERY  2016   fitting artificial eye    OB History   No obstetric history on file.    Obstetric Comments  Pt has two sons.         Home Medications  Prior to Admission medications   Medication Sig Start Date End Date Taking? Authorizing Provider  albuterol (PROVENTIL HFA;VENTOLIN HFA) 108 (90 BASE) MCG/ACT inhaler Inhale 2 puffs into the lungs every 4 (four) hours as needed for wheezing or shortness of breath. For short    [provider]  aspirin EC 81 MG tablet Take 81 mg by mouth daily.    [provider]  B-D UF III MINI PEN NEEDLES 31G X 5 MM MISC U 1 PEN NEEDLE QID WITH EACH INJECTION 08/01/18   [provider]  cetirizine (ZYRTEC) 10 MG tablet Take 10 mg by mouth at bedtime.  05/31/18   [provider]  cyclobenzaprine (FLEXERIL) 10 MG tablet Take 1 tablet (10 mg total) by mouth 2 (two) times daily as needed for muscle spasms. 01/06/19   Hall-Potvin, [place], PA-C  esomeprazole (NEXIUM) 40 MG capsule Take 40 mg by mouth daily.     [provider]  fluticasone (FLONASE) 50 MCG/ACT nasal spray Place 1 spray into both nostrils daily.     [provider]  guaiFENesin (MUCINEX) 600 MG 12 hr tablet Take 1 tablet (600 mg total) by mouth 2 (two) times daily. 07/09/18   Lendon Colonel, NP  HUMALOG KWIKPEN 100 UNIT/ML KiwkPen Inject 8-14 Units into the skin See admin instructions. Inject 8 units in morning, 12 units at lunch, and 16 units at dinner 01/24/17   [provider]  levothyroxine (SYNTHROID, LEVOTHROID) 150 MCG tablet Take 150 mcg by mouth daily before breakfast.    [provider]  Linaclotide (LINZESS) 145 MCG CAPS capsule Take 145 mcg by mouth daily as needed (constipation).     [provider]  metolazone (ZAROXOLYN) 2.5 MG tablet Take 1 tablet (2.5 mg total) by mouth daily. 07/26/18   Erlene Quan, PA-C  potassium chloride SA (K-DUR,KLOR-CON) 20 MEQ tablet TAKE 1 TABLET (20 MEQ TOTAL) BY MOUTH DAILY Patient taking differently: Take 20 mEq by mouth daily.  07/04/18   Bensimhon, Shaune Pascal, MD  predniSONE (DELTASONE) 20 MG tablet Take 1 tablet (20 mg total) by mouth 2 (two) times daily with a meal. 01/27/19   Raylene Everts, MD  pregabalin (LYRICA) 150 MG capsule Take 150 mg by mouth 3 (three) times daily.  01/01/18   [provider]  simvastatin (ZOCOR) 20 MG tablet Take 20 mg by mouth every evening.     [provider]  spironolactone (ALDACTONE) 25 MG tablet Take 0.5 tablets (12.5 mg total) by mouth daily. 10/30/17   Shirley Friar, PA-C  torsemide (DEMADEX) 20 MG tablet Take 3 tablets (60 mg total) by mouth See  admin instructions. 12/26/18   Clegg, Amy D, NP  TRESIBA FLEXTOUCH 200 UNIT/ML SOPN Inject 60 Units into the skin at bedtime.  05/30/18   [provider]  triamcinolone cream (KENALOG) 0.1 % Apply 1 application topically daily as needed (irritation).  05/30/18   [provider]    Family History Family History  Problem Relation Age of Onset  . Diabetes Sister   . Glaucoma Sister   . Hypertension Sister   . Healthy Mother   . Healthy Father   . Stroke Brother   . Heart attack Paternal Aunt   . Breast cancer Neg Hx     Social History Social History   Tobacco Use  . Smoking status: Former Smoker    Types: Cigarettes    Quit date: 05/22/1992    Years since quitting: 26.7  .  Smokeless tobacco: Never Used  Substance Use Topics  . Alcohol use: No    Comment: quit 20 yrs ago  . Drug use: No     Allergies   Penicillins, Vicodin [hydrocodone-acetaminophen], Codeine, Iodine-131, Iohexol, Lisinopril, and Sulfa antibiotics   Review of Systems Review of Systems  Constitutional: Negative for chills and fever.  HENT: Negative for ear pain and sore throat.   Eyes: Negative for pain and visual disturbance.  Respiratory: Negative for cough and shortness of breath.   Cardiovascular: Negative for chest pain and palpitations.  Gastrointestinal: Negative for abdominal pain and vomiting.  Genitourinary: Negative for dysuria and hematuria.  Musculoskeletal: Positive for arthralgias. Negative for back pain.  Skin: Negative for color change and rash.  Neurological: Negative for seizures and syncope.  All other systems reviewed and are negative.    Physical Exam Triage Vital Signs ED Triage Vitals  Enc Vitals Group     BP 01/27/19 1733 (!) 144/56     Pulse Rate 01/27/19 1733 84     Resp 01/27/19 1733 18     Temp 01/27/19 1733 98.2 F (36.8 C)     Temp Source 01/27/19 1733 Temporal     SpO2 01/27/19 1733 100 %     Weight --      Height --      Head Circumference --       Peak Flow --      Pain Score 01/27/19 1731 10     Pain Loc --      Pain Edu? --      Excl. in [place]? --    No data found.  Updated Vital Signs BP (!) 144/56 (BP Location: Left Arm)   Pulse 84   Temp 98.2 F (36.8 C) (Temporal)   Resp 18   SpO2 100%   Visual Acuity Right Eye Distance:   Left Eye Distance:   Bilateral Distance:    Right Eye Near:   Left Eye Near:    Bilateral Near:     Physical Exam Constitutional:      General: She is not in acute distress.    Appearance: She is well-developed.  HENT:     Head: Normocephalic and atraumatic.  Eyes:     Conjunctiva/sclera: Conjunctivae normal.     Pupils: Pupils are equal, round, and reactive to light.     Comments: Disconjugate gaze  Neck:     Musculoskeletal: Normal range of motion.  Cardiovascular:     Rate and Rhythm: Normal rate.  Pulmonary:     Effort: Pulmonary effort is normal. No respiratory distress.  Abdominal:     General: There is no distension.     Palpations: Abdomen is soft.  Musculoskeletal: Normal range of motion.  Skin:    General: Skin is warm and dry.     Comments: Left hand is unremarkable on examination.  Unable to reproduce triggering.  Right hand has redness swelling and extreme pain with any movement of the index finger.  This appears to be an extensor tenosynovitis.  There is no wounds or signs of infection  Neurological:     Mental Status: She is alert.      UC Treatments / Results  Labs (all labs ordered are listed, but only abnormal results are displayed) Labs Reviewed - No data to display  EKG   Radiology No results found.  Procedures Procedures (including critical care time)  Medications Ordered in UC Medications  ketorolac (TORADOL) 30 MG/ML injection 30 mg (30  mg Intramuscular Given 01/27/19 1822)  ketorolac (TORADOL) 30 MG/ML injection (has no administration in time range)    Initial Impression / Assessment and Plan / UC Course  I have reviewed the triage  vital signs and the nursing notes.  Pertinent labs & imaging results that were available during my care of the patient were reviewed by me and considered in my medical decision making (see chart for details).    Final Clinical Impressions(s) / UC Diagnoses   Final diagnoses:  Tenosynovitis of fingers     Discharge Instructions     Do not use hand Use ice to reduce pain and swelling Take prednisone Be careful to check your sugar while taking prednisone and watch your diet carefully Follow-up with your family doctor   ED Prescriptions    Medication Sig Dispense Auth. Provider   predniSONE (DELTASONE) 20 MG tablet Take 1 tablet (20 mg total) by mouth 2 (two) times daily with a meal. 10 tablet Raylene Everts, MD     Controlled Substance Prescriptions  Controlled Substance Registry consulted? Not Applicable   Raylene Everts, MD 01/27/19 Doran Heater

## 2019-01-27 NOTE — Discharge Instructions (Addendum)
Do not use hand Use ice to reduce pain and swelling Take prednisone Be careful to check your sugar while taking prednisone and watch your diet carefully Follow-up with your family doctor

## 2019-01-30 ENCOUNTER — Other Ambulatory Visit (HOSPITAL_COMMUNITY): Payer: Self-pay | Admitting: Student

## 2019-01-31 IMAGING — CR DG KNEE COMPLETE 4+V*R*
4 series · 4 of 4 positions shown · non-contrast
Comparison: 05/24/2015

CLINICAL DATA: Patient fell about a week ago and hit right knee
against a wall. Pain.

EXAM:
RIGHT KNEE - COMPLETE 4+ VIEW

[t knee ap right]
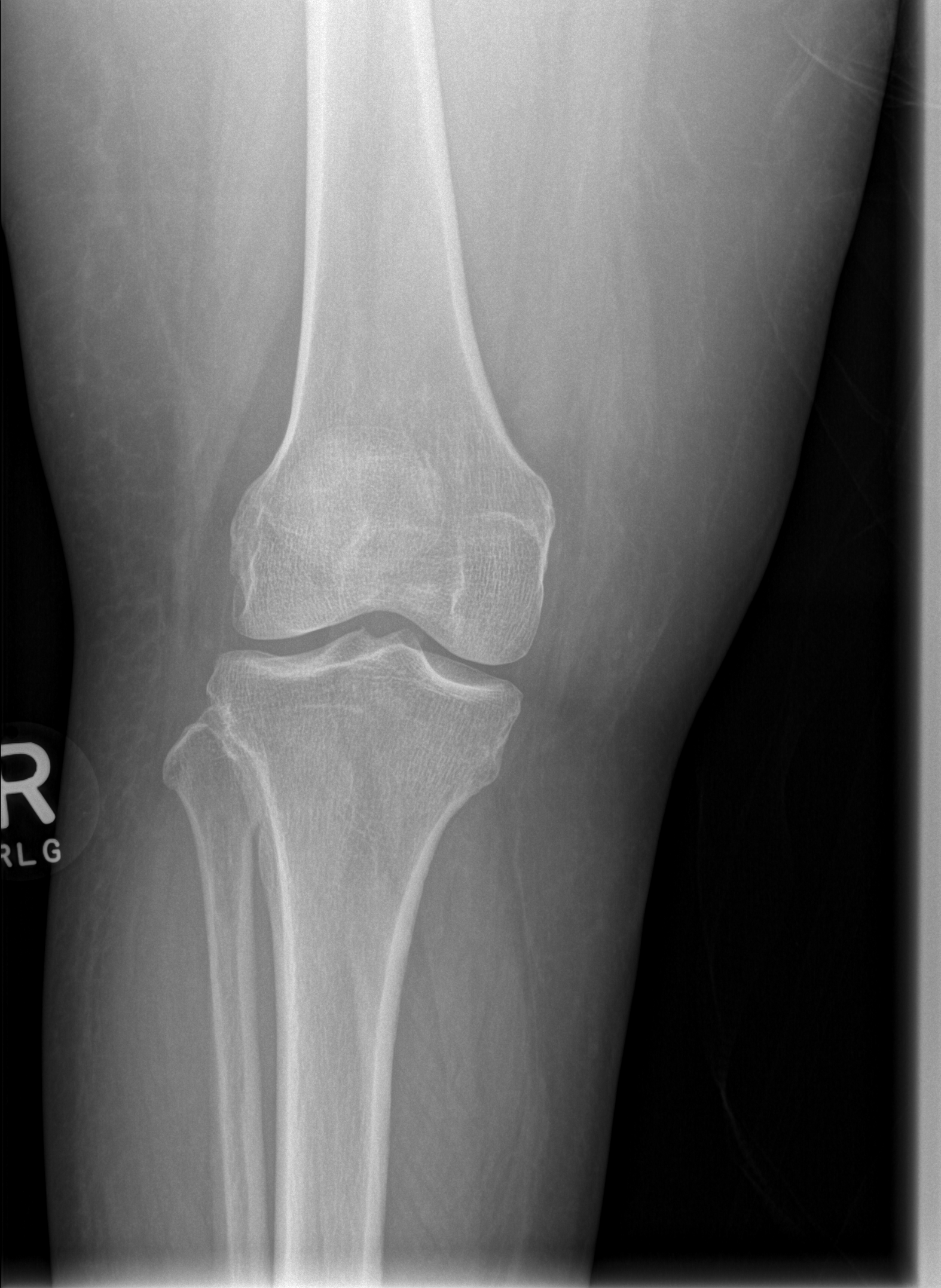

[t knee obl right (1 of 2)]
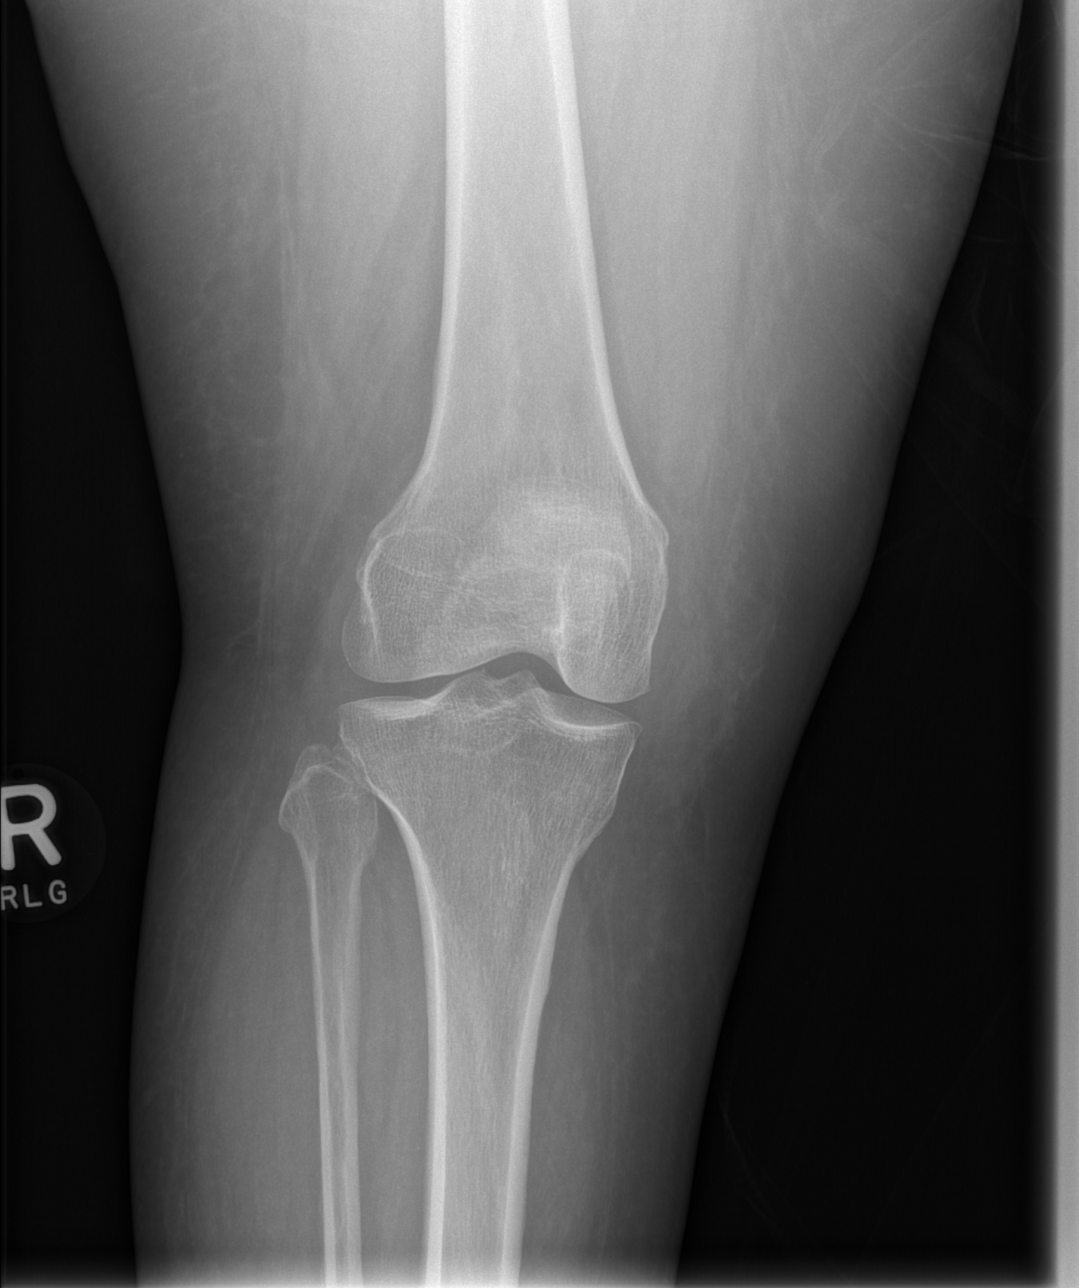

[t knee obl right (2 of 2)]
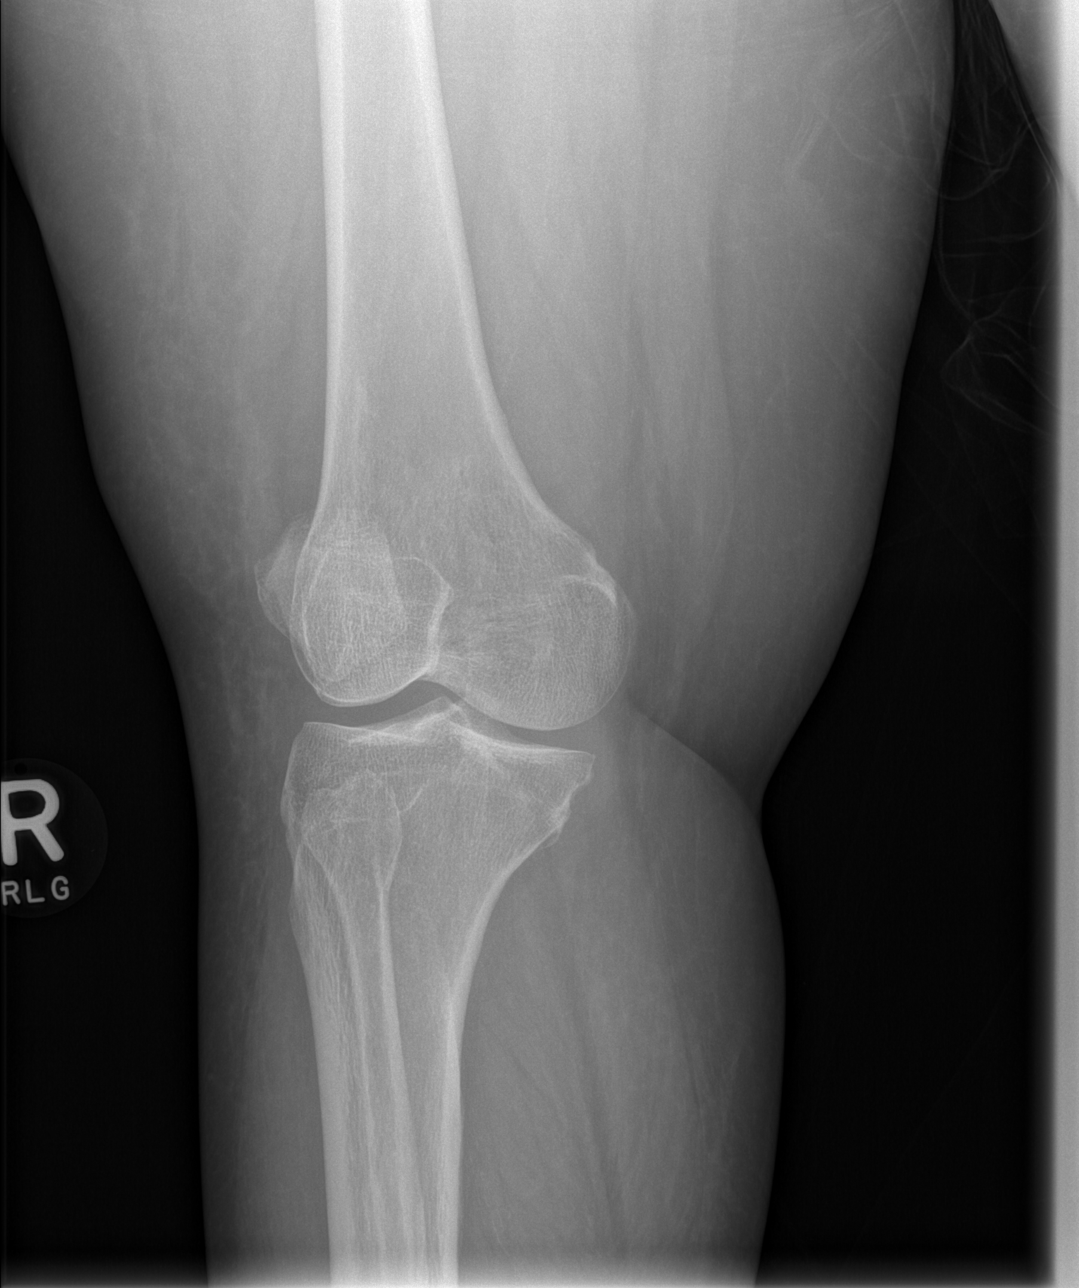

[t knee lat right]
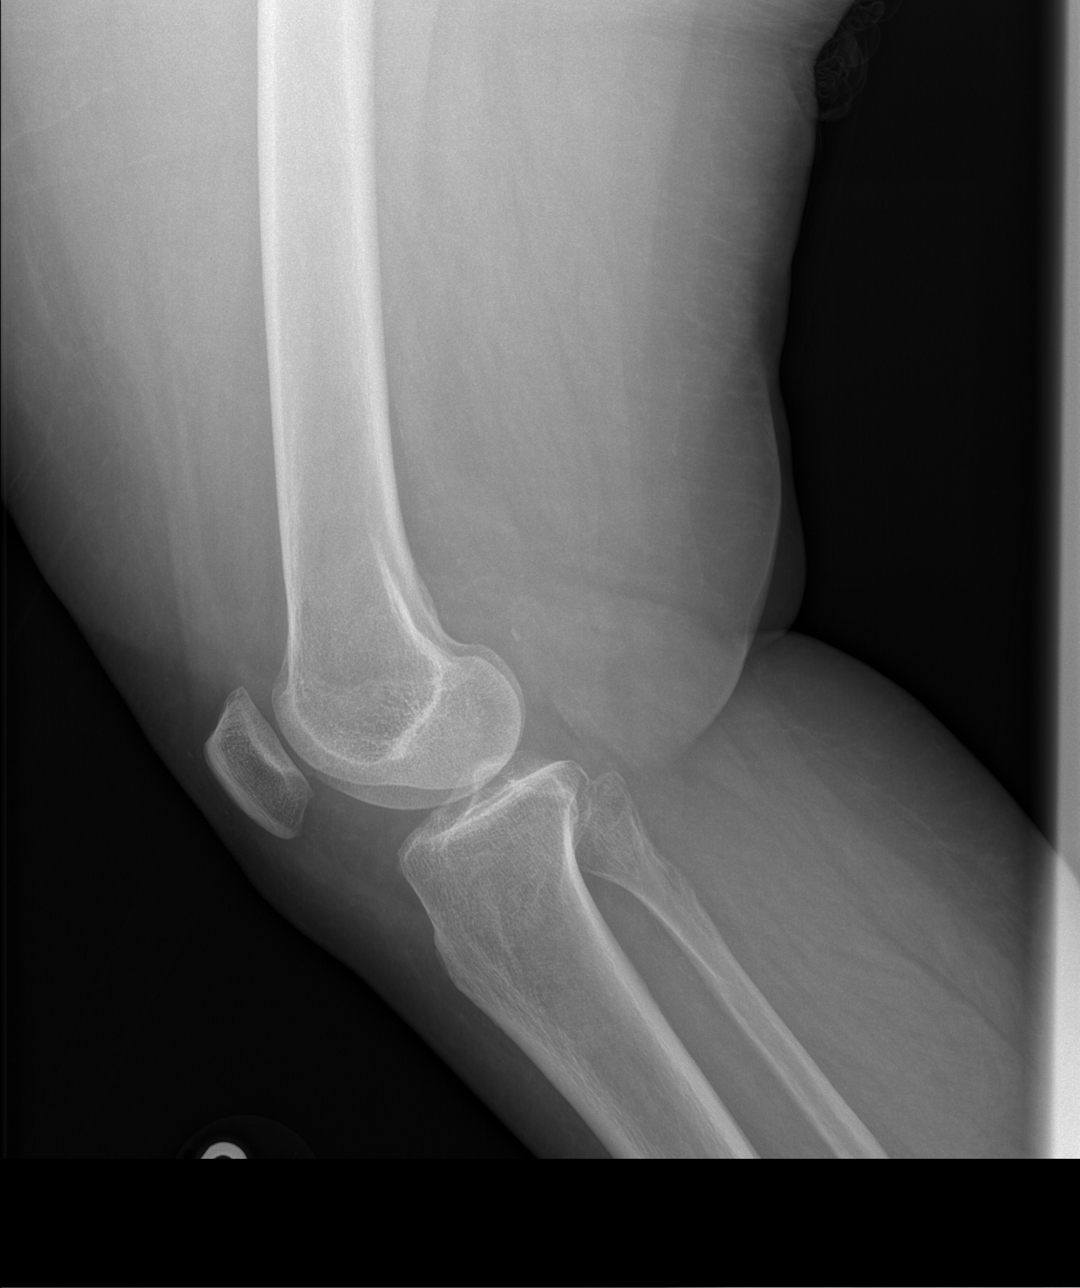

[4 of 4 positions shown; findings below may reference images not displayed]

FINDINGS: No evidence of fracture, dislocation, or joint effusion. No evidence
of arthropathy or other focal bone abnormality. Soft tissues are
unremarkable.
IMPRESSION: Negative.

## 2019-02-11 ENCOUNTER — Telehealth (HOSPITAL_COMMUNITY): Payer: Self-pay | Admitting: *Deleted

## 2019-02-11 DIAGNOSIS — I5022 Chronic systolic (congestive) heart failure: Secondary | ICD-10-CM

## 2019-02-11 MED ORDER — TORSEMIDE 20 MG PO TABS: 80.0000 mg | ORAL_TABLET | ORAL | 3 refills | Status: DC

## 2019-02-11 NOTE — Telephone Encounter (Signed)
Pt called c/o weight being up 3lbs and she noticed some swelling in her abdomen. Pt admitted to eating salty food over the weekend. I asked patient her torsemide dose and she said she takes 60mg  daily. Per Darrick Grinder, NP Torsemide shouldve been increased to 80mg  in august. Pt aware and will take extra 20mg  today and start 80mg  daily.  Lab appt scheduled for next week. Pt advised to call our office is weight does not go down. Pt aware and agreeable with plan.

## 2019-02-12 ENCOUNTER — Other Ambulatory Visit (HOSPITAL_COMMUNITY): Payer: Self-pay | Admitting: *Deleted

## 2019-02-12 ENCOUNTER — Telehealth: Payer: Self-pay | Admitting: Cardiovascular Disease

## 2019-02-12 MED ORDER — TORSEMIDE 20 MG PO TABS: 80.0000 mg | ORAL_TABLET | Freq: Every day | ORAL | 3 refills | Status: DC

## 2019-02-12 NOTE — Telephone Encounter (Signed)
Called patient- she states she is having some issues with swelling, and its causing her to have a little difficulty breathing. I advised with patient the message from yesterday regarding her toresemide dose- patient states she was unaware, but message states below: Pt called c/o weight being up 3lbs and she noticed some swelling in her abdomen. Pt admitted to eating salty food over the weekend. I asked patient her torsemide dose and she said she takes 60mg  daily. Per Darrick Grinder, NP Torsemide shouldve been increased to 80mg  in august. Pt aware and will take extra 20mg  today and start 80mg  daily.  Lab appt scheduled for next week. Pt advised to call our office is weight does not go down. Pt aware and agreeable with plan.   Patient has not started this yet- advised her to take extra 20 mg today- as she was only taking 60 mg, and the RX had been sent to her pharmacy for the 80 mg daily Patient will call back if not better.

## 2019-02-12 NOTE — Telephone Encounter (Signed)
° ° °  Pt c/o swelling: STAT is pt has developed SOB within 24 hours  1) How much weight have you gained and in what time span?   2) If swelling, where is the swelling located? Legs, ankles, abdomen  3) Are you currently taking a fluid pill? yes  4) Are you currently SOB? A little  Do you have a log of your daily weights (if so, list)? 228 today, 222 yesterday, 218 on 9/26 5) Have you gained 3 pounds in a day or 5 pounds in a week?   6) Have you traveled recently? no

## 2019-02-13 ENCOUNTER — Telehealth: Payer: Self-pay | Admitting: Cardiovascular Disease

## 2019-02-13 NOTE — Telephone Encounter (Signed)
Agree with plan no change.   Amy Clegg  NP-C  3:41 PM

## 2019-02-13 NOTE — Telephone Encounter (Signed)
Spoke with Microbiologist. Patients Torsemide had been changed back to 60 mg daily secondary to elevated kidney function on 12/25/18. Patients weight is up 7 pounds in 1 week. Patient is taking Torsemide 80 mg today and tomorrow.  No change in shortness of breath per Edwena Felty RN who fixes patients meds, currently with patient. Will forward Darrick Grinder NP for further recommendations Any medication changes need to be called to [facility] [phone] and faxed to [facility] at [phone]

## 2019-02-13 NOTE — Telephone Encounter (Signed)
  Patient states that she takes torsemide and was recently changed to 80 mg. Her home health nurse is stating that she needs an order faxed to [facility] before she can give her the new dosage. And please call the patient to let her know how long she will be on the new dosage.

## 2019-02-14 ENCOUNTER — Telehealth: Payer: Self-pay

## 2019-02-14 NOTE — Telephone Encounter (Signed)
Successfully sent to [facility] via [facility] fax.

## 2019-02-14 NOTE — Telephone Encounter (Signed)
Scheduled pt for appt with Kerin Ransom, PA on 02/18/19 at 10:45 AM.

## 2019-02-14 NOTE — Telephone Encounter (Signed)
   [facility] Pre-operative Risk Assessment    Request for surgical clearance:  1. What type of surgery is being performed? Right index A-1 pulley release   2. When is this surgery scheduled? 03/06/19   3. What type of clearance is required (medical clearance vs. Pharmacy clearance to hold med vs. Both)? Medical  4. Are there any medications that need to be held prior to surgery and how long? No   5. Practice name and name of physician performing surgery? [facility]   6. What is your office phone number [phone]    7.   What is your office fax number [phone]  8.   Anesthesia type (None, local, MAC, general) ? [facility] 02/14/2019, 11:53 AM  _________________________________________________________________   (provider comments below)

## 2019-02-14 NOTE — Telephone Encounter (Signed)
Informed pt of appt. She verbalized thanks and understanding.

## 2019-02-14 NOTE — Telephone Encounter (Signed)
   Primary Cardiologist:Jonathan Gwenlyn Found, MD  Chart reviewed as part of pre-operative protocol coverage.   Amy [name] was last seen on 12/2018 by Darrick Grinder NP. She does not have a history of MI or stroke. She has a history of CHF and this has been managed by our advanced heart failure team. Yesterday, she was instructed to increase her diuretic for hypervolemia.  She has been noncompliant with a low sodium diet. The increased diuretic has helped her volume status. She denies anginal symptoms. She is sedentary secondary to needing a walker and blindness. Given her overall clinical picture, she is at higher risk for cardiovascular complications ([id number]).    Because of [name]'s past medical history and time since last visit, he/she will require a follow-up visit in order to better assess preoperative cardiovascular risk.  We will try to align her appt with her scheduled lab work.  Pre-op covering staff: - Please schedule appointment and call patient to inform them. - Please contact requesting surgeon's office via preferred method (i.e, phone, fax) to inform them of need for appointment prior to surgery.  If applicable, this message will also be routed to pharmacy pool and/or primary cardiologist for input on holding anticoagulant/antiplatelet agent as requested below so that this information is available at time of patient's appointment.   [place], PA  02/14/2019, 12:25 PM

## 2019-02-15 ENCOUNTER — Telehealth (HOSPITAL_COMMUNITY): Payer: Self-pay | Admitting: *Deleted

## 2019-02-15 NOTE — Telephone Encounter (Signed)
Received call from [facility], they state we advised pt to increase Torsemide to 80 mg daily and they just need an order stating that, order faxed to them at [phone]

## 2019-02-18 ENCOUNTER — Encounter: Payer: Self-pay | Admitting: Cardiology

## 2019-02-18 ENCOUNTER — Ambulatory Visit (INDEPENDENT_AMBULATORY_CARE_PROVIDER_SITE_OTHER): Payer: Medicare Other | Admitting: Cardiology

## 2019-02-18 ENCOUNTER — Other Ambulatory Visit (HOSPITAL_COMMUNITY): Payer: Medicare Other

## 2019-02-18 ENCOUNTER — Encounter: Payer: Self-pay | Admitting: Podiatry

## 2019-02-18 ENCOUNTER — Ambulatory Visit: Payer: Medicare Other

## 2019-02-18 ENCOUNTER — Other Ambulatory Visit: Payer: Self-pay

## 2019-02-18 ENCOUNTER — Ambulatory Visit (INDEPENDENT_AMBULATORY_CARE_PROVIDER_SITE_OTHER): Payer: Medicare Other | Admitting: Podiatry

## 2019-02-18 VITALS — BP 106/66 | HR 65 | Temp 97.3°F | Ht 59.0 in | Wt 229.0 lb

## 2019-02-18 DIAGNOSIS — L97519 Non-pressure chronic ulcer of other part of right foot with unspecified severity: Secondary | ICD-10-CM | POA: Diagnosis not present

## 2019-02-18 DIAGNOSIS — N183 Chronic kidney disease, stage 3 unspecified: Secondary | ICD-10-CM | POA: Diagnosis not present

## 2019-02-18 DIAGNOSIS — L97511 Non-pressure chronic ulcer of other part of right foot limited to breakdown of skin: Secondary | ICD-10-CM

## 2019-02-18 DIAGNOSIS — Z01810 Encounter for preprocedural cardiovascular examination: Secondary | ICD-10-CM | POA: Insufficient documentation

## 2019-02-18 DIAGNOSIS — G4733 Obstructive sleep apnea (adult) (pediatric): Secondary | ICD-10-CM

## 2019-02-18 DIAGNOSIS — I10 Essential (primary) hypertension: Secondary | ICD-10-CM | POA: Diagnosis not present

## 2019-02-18 DIAGNOSIS — Z9989 Dependence on other enabling machines and devices: Secondary | ICD-10-CM

## 2019-02-18 DIAGNOSIS — I5032 Chronic diastolic (congestive) heart failure: Secondary | ICD-10-CM | POA: Diagnosis not present

## 2019-02-18 DIAGNOSIS — Z794 Long term (current) use of insulin: Secondary | ICD-10-CM

## 2019-02-18 DIAGNOSIS — E118 Type 2 diabetes mellitus with unspecified complications: Secondary | ICD-10-CM

## 2019-02-18 NOTE — Progress Notes (Signed)
Subjective:  Patient ID: Amy [name], female    DOB: July 22, 1960,  MRN: [name]  Chief Complaint  Patient presents with  . 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 female presents for wound care. She states that she does not know how long she has had the wound for.  She has been ambulating with a walker and putting a lot of pressure to the posterior heel right foot.  She is a diabetic with A1c of 10.  Pain is elevated when applying pressure.  Patient was unable to get x-rays secondary to not being able to hold still.  She denies any nausea fever chills vomiting shortness of breath.   Review of Systems: Negative except as noted in the HPI. Denies N/V/F/Ch.  Past Medical History:  Diagnosis Date  . Arthritis   . Asthma   . Blind   . Blindness and low vision    left eye glass eye,  legally blind in right eye  . Diabetes mellitus   . Diabetic neuropathy ([place])   . Hyperlipidemia   . Hypertension   . Hypothyroidism   . Spinal stenosis     Current Outpatient Medications:  .  albuterol (PROVENTIL HFA;VENTOLIN HFA) 108 (90 BASE) MCG/ACT inhaler, Inhale 2 puffs into the lungs every 4 (four) hours as needed for wheezing or shortness of breath. For short, Disp: , Rfl:  .  aspirin EC 81 MG tablet, Take 81 mg by mouth daily., Disp: , Rfl:  .  B-D UF III MINI PEN NEEDLES 31G X 5 MM MISC, U 1 PEN NEEDLE QID WITH EACH INJECTION, Disp: , Rfl:  .  cetirizine (ZYRTEC) 10 MG tablet, Take 10 mg by mouth at bedtime. , Disp: , Rfl:  .  cyclobenzaprine (FLEXERIL) 10 MG tablet, Take 1 tablet (10 mg total) by mouth 2 (two) times daily as needed for muscle spasms., Disp: 20 tablet, Rfl: 0 .  esomeprazole (NEXIUM) 40 MG capsule, Take 40 mg by mouth daily. , Disp: , Rfl:  .  fluticasone (FLONASE) 50 MCG/ACT nasal spray, Place 1 spray into both nostrils daily. , Disp: , Rfl:  .   guaiFENesin (MUCINEX) 600 MG 12 hr tablet, Take 1 tablet (600 mg total) by mouth 2 (two) times daily., Disp: 60 tablet, Rfl: 0 .  HUMALOG KWIKPEN 100 UNIT/ML KiwkPen, Inject 8-14 Units into the skin See admin instructions. Inject 8 units in morning, 12 units at lunch, and 16 units at dinner, Disp: , Rfl:  .  levothyroxine (SYNTHROID, LEVOTHROID) 150 MCG tablet, Take 150 mcg by mouth daily before breakfast., Disp: , Rfl:  .  Linaclotide (LINZESS) 145 MCG CAPS capsule, Take 145 mcg by mouth daily as needed (constipation). , Disp: , Rfl:  .  metolazone (ZAROXOLYN) 2.5 MG tablet, Take 1 tablet (2.5 mg total) by mouth daily., Disp: 30 tablet, Rfl: 3 .  potassium chloride SA (K-DUR,KLOR-CON) 20 MEQ tablet, TAKE 1 TABLET (20 MEQ TOTAL) BY MOUTH DAILY (Patient taking differently: Take 20 mEq by mouth daily. ), Disp: 65 tablet, Rfl: 2 .  predniSONE (DELTASONE) 20 MG tablet, Take 1 tablet (20 mg total) by mouth 2 (two) times daily with a meal., Disp: 10 tablet, Rfl: 0 .  pregabalin (LYRICA) 150 MG capsule, Take 150 mg by mouth 3 (three) times daily. , Disp: , Rfl:  .  simvastatin (ZOCOR) 20 MG tablet, Take 20 mg by mouth every evening. , Disp: , Rfl:  .  spironolactone (ALDACTONE) 25 MG tablet, TAKE 1/2 TABLET BY MOUTH DAILY, Disp: 315 tablet, Rfl: 0 .  torsemide (DEMADEX) 20 MG tablet, Take 4 tablets (80 mg total) by mouth daily., Disp: 120 tablet, Rfl: 3 .  TRESIBA FLEXTOUCH 200 UNIT/ML SOPN, Inject 60 Units into the skin at bedtime. , Disp: , Rfl:  .  triamcinolone cream (KENALOG) 0.1 %, Apply 1 application topically daily as needed (irritation). , Disp: , Rfl:   Social History   Tobacco Use  Smoking Status Former Smoker  . Types: Cigarettes  . Quit date: 05/22/1992  . Years since quitting: 26.7  Smokeless Tobacco Never Used    Allergies  Allergen Reactions  . Penicillins Hives and Swelling    Has patient had a PCN reaction causing immediate rash, facial/tongue/throat swelling, SOB or  lightheadedness with hypotension: yes- face swelling Has patient had a PCN reaction causing severe rash involving mucus membranes or skin necrosis: no Has patient had a PCN reaction that required hospitalization unknown (childhood allergy) Has patient had a PCN reaction occurring within the last 10 years: no If all of the above answers are "NO", then may proceed with Cephalosporin use.   . Vicodin [Hydrocodone-Acetaminophen] Nausea And Vomiting  . Codeine Nausea Only  . Iodine-131 Hives  . Iohexol Hives    Pt developed itching and hives along with nasal congestion; needs 13 hour premeds for future studies, Onset Date: [id number]   . Lisinopril Cough  . Sulfa Antibiotics Nausea And Vomiting   Objective:  There were no vitals filed for this visit. There is no height or weight on file to calculate BMI. Constitutional Well developed. Well nourished.  Vascular Dorsalis pedis pulses palpable bilaterally. Posterior tibial pulses palpable bilaterally. Capillary refill normal to all digits.  No cyanosis or clubbing noted. Pedal hair growth normal.  Neurologic Normal speech. Oriented to person, place, and time. Protective sensation absent  Dermatologic Wound Location: R heel posterior  Wound Base: Granular/Healthy Peri-wound: Calloused Exudate: Scant/small amount Serous exudate Wound Measurements: -0.5cm 0.5 cm  Orthopedic: No pain to palpation either foot.   Radiographs: Patient unable to perform Assessment:   1. Ulcer of right foot, unspecified ulcer stage ([place])   2. Ulcerated, foot, right, limited to breakdown of skin [facility])    Plan:  Patient was evaluated and treated and all questions answered.  Patient was evaluated and treated and all questions answered.  1.  Posterior heel ulcer 0.5cm x 0.5cm  -Debridement as below. -Dressed with triple abx and bandaid , DSD. -Continue off-loading with CAM boot  Procedure: Excisional Debridement of Wound Rationale: Removal of non-viable  soft tissue from the wound to promote healing.  Anesthesia: none Pre-Debridement Wound Measurements: 0.5 cm x 0.5 cm x .0.1 cm  Post-Debridement Wound Measurements: 0.4 cm x 0.4 cm x 0.1 cm  Type of Debridement: Sharp Excisional Tissue Removed: Non-viable soft tissue Depth of Debridement: subcutaneous tissue. Technique: Sharp excisional debridement to bleeding, viable wound base.  Dressing: Dry, sterile, compression dressing. Disposition: Patient tolerated procedure well. Patient to return in 1 week for follow-up. No follow-ups on file.

## 2019-02-18 NOTE — Telephone Encounter (Signed)
   Primary Cardiologist: Quay Burow, MD  Chart reviewed and patient seen in the office today as part of pre-operative protocol coverage. Given past medical history and time since last visit, based on ACC/AHA guidelines, HERMENIA WHITNER would be at acceptable risk for the planned procedure without further cardiovascular testing.   I will route this recommendation to the requesting party via Epic fax function and remove from pre-op pool.  Please call with questions.  Kerin Ransom, PA-C 02/18/2019, 11:21 AM

## 2019-02-18 NOTE — Assessment & Plan Note (Signed)
Compliant with C-pap 

## 2019-02-18 NOTE — Assessment & Plan Note (Signed)
Baseline SCr 1.8-2.0

## 2019-02-18 NOTE — Progress Notes (Signed)
Cardiology Office Note:    Date:  02/18/2019   ID:  Amy [name], DOB 1961-05-01, MRN [name]  PCP:  Amy Hatchet, MD  Cardiologist:  Amy Burow, MD  Electrophysiologist:  None  CHF: Dr Amy [name]  Referring MD: Amy Hatchet, MD   Pre op cardiac clearance  History of Present Illness:    Amy [name] is a pleasant, morbidly obese 58 y.o.AA female with a hx of insulin-dependent diabetes with retinopathy, nephropathy, and neuropathy.  She is blind.  She has morbid obesity (BMI 46) and is on CPAP. She reports compliance with C-pap to me today.  She has chronic diastolic heart failure.  She is followed in the CHF clinic.  Echocardiogram in November 2019 showed an ejection fraction of 60 to 65% with grade 1 diastolic dysfunction.  She had a remote nuclear stress in 2003 which was low risk.  She is in the office today for preop clearance prior to a right trigger finger release.  The patient has had intermittent issues with volume overload and her diuretics were adjusted recently, this visit was to make sure she was moving in the right direction.  The patient was seen in the office in March 2020 with CHF exacerbation.  She was offered admission but declined and opted for outpatient adjustment of her diuretics.  She was evaluated by the heart failure clinic in April 2020 was doing well then.  She was admitted to the emergency room in June 2020 with olecranon bursitis, she was in the hospital for a week.  In July she called complaining of increasing shortness of breath and weight and her diuretics were adjusted for a few days.  She was seen in the emergency room in September with pain and clicking of her right index finger.  She was referred to Dr. Burney [name] and now will need surgery with local anesthesia.  On 02/12/2019 she again called complaining of increasing weight swelling and shortness of breath and her diuretics were adjusted.    Since then she says she has been doing well.  She  admits that prior to her last exacerbation she had been eating some chips and dip.  She says since then she has been very careful about her diet.  Her sister accompanied her today as well and added to the patient's history.  From a cardiac standpoint she appears to be an acceptable risk for the proposed procedure without further work-up.  She has no history of coronary disease or MI and denies chest pain.  Past Medical History:  Diagnosis Date  . Arthritis   . Asthma   . Blind   . Blindness and low vision    left eye glass eye,  legally blind in right eye  . Diabetes mellitus   . Diabetic neuropathy ([place])   . Hyperlipidemia   . Hypertension   . Hypothyroidism   . Spinal stenosis     Past Surgical History:  Procedure Laterality Date  . ABDOMINAL HYSTERECTOMY  1990  . [place]   x 2  . CHOLECYSTECTOMY  2008  . ENUCLEATION Bilateral 09/15/1998  . EYE SURGERY  2016   fitting artificial eye    Current Medications: Current Meds  Medication Sig  . albuterol (PROVENTIL HFA;VENTOLIN HFA) 108 (90 BASE) MCG/ACT inhaler Inhale 2 puffs into the lungs every 4 (four) hours as needed for wheezing or shortness of breath. For short  . aspirin EC 81 MG tablet Take 81 mg by mouth daily.  Marland Kitchen  B-D UF III MINI PEN NEEDLES 31G X 5 MM MISC U 1 PEN NEEDLE QID WITH EACH INJECTION  . cetirizine (ZYRTEC) 10 MG tablet Take 10 mg by mouth at bedtime.   . cyclobenzaprine (FLEXERIL) 10 MG tablet Take 1 tablet (10 mg total) by mouth 2 (two) times daily as needed for muscle spasms.  Marland Kitchen esomeprazole (NEXIUM) 40 MG capsule Take 40 mg by mouth daily.   . fluticasone (FLONASE) 50 MCG/ACT nasal spray Place 1 spray into both nostrils daily.   Marland Kitchen guaiFENesin (MUCINEX) 600 MG 12 hr tablet Take 1 tablet (600 mg total) by mouth 2 (two) times daily.  Marland Kitchen HUMALOG KWIKPEN 100 UNIT/ML KiwkPen Inject 8-14 Units into the skin See admin instructions. Inject 8 units in morning, 12 units at lunch, and 16 units at  dinner  . levothyroxine (SYNTHROID, LEVOTHROID) 150 MCG tablet Take 150 mcg by mouth daily before breakfast.  . Linaclotide (LINZESS) 145 MCG CAPS capsule Take 145 mcg by mouth daily as needed (constipation).   . metolazone (ZAROXOLYN) 2.5 MG tablet Take 1 tablet (2.5 mg total) by mouth daily.  . potassium chloride SA (K-DUR,KLOR-CON) 20 MEQ tablet TAKE 1 TABLET (20 MEQ TOTAL) BY MOUTH DAILY (Patient taking differently: Take 20 mEq by mouth daily. )  . predniSONE (DELTASONE) 20 MG tablet Take 1 tablet (20 mg total) by mouth 2 (two) times daily with a meal.  . pregabalin (LYRICA) 150 MG capsule Take 150 mg by mouth 3 (three) times daily.   . simvastatin (ZOCOR) 20 MG tablet Take 20 mg by mouth every evening.   Marland Kitchen spironolactone (ALDACTONE) 25 MG tablet TAKE 1/2 TABLET BY MOUTH DAILY  . torsemide (DEMADEX) 20 MG tablet Take 4 tablets (80 mg total) by mouth daily.  . TRESIBA FLEXTOUCH 200 UNIT/ML SOPN Inject 60 Units into the skin at bedtime.   . triamcinolone cream (KENALOG) 0.1 % Apply 1 application topically daily as needed (irritation).      Allergies:   Penicillins, Vicodin [hydrocodone-acetaminophen], Codeine, Iodine-131, Iohexol, Lisinopril, and Sulfa antibiotics   Social History   Socioeconomic History  . Marital status: Single    Spouse name: Not on file  . Number of children: 2  . Years of education: 63  . Highest education level: Not on file  Occupational History  . Occupation: N/A    Comment: disabled  Social Needs  . Financial resource strain: Not on file  . Food insecurity    Worry: Not on file    Inability: Not on file  . Transportation needs    Medical: Not on file    Non-medical: Not on file  Tobacco Use  . Smoking status: Former Smoker    Types: Cigarettes    Quit date: 05/22/1992    Years since quitting: 26.7  . Smokeless tobacco: Never Used  Substance and Sexual Activity  . Alcohol use: No    Comment: quit 20 yrs ago  . Drug use: No  . Sexual activity: Not  on file  Lifestyle  . Physical activity    Days per week: Not on file    Minutes per session: Not on file  . Stress: Not on file  Relationships  . Social Herbalist on phone: Not on file    Gets together: Not on file    Attends religious service: Not on file    Active member of club or organization: Not on file    Attends meetings of clubs or organizations: Not on file  Relationship status: Not on file  Other Topics Concern  . Not on file  Social History Narrative   Lives alone   caffeine drinks 2 cups of coffee a day, occasional soda      Family History: The patient's family history includes Diabetes in her sister; Glaucoma in her sister; Healthy in her father and mother; Heart attack in her paternal aunt; Hypertension in her sister; Stroke in her brother. There is no history of Breast cancer.  ROS:   Please see the history of present illness.   Patient has a painful right heel callous    All other systems reviewed and are negative.  EKGs/Labs/Other Studies Reviewed:    The following studies were reviewed today: Echo Nov 2019  EKG:  EKG is ordered today.  The ekg ordered today demonstrates NSR- HR 65, one PAC  Recent Labs: 07/26/2018: NT-Pro BNP 120 10/30/2018: ALT 30 01/07/2019: BUN 49; Creatinine, Ser 1.94; Hemoglobin 12.3; Platelets 155; Potassium 4.2; Sodium 142 01/08/2019: B Natriuretic Peptide 61.8  Recent Lipid Panel    Component Value Date/Time   CHOL 170 10/22/2012 1351   TRIG 112 10/22/2012 1351   HDL 56 10/22/2012 1351   CHOLHDL 3.0 10/22/2012 1351   VLDL 22 10/22/2012 1351   LDLCALC 92 10/22/2012 1351    Physical Exam:    VS:  BP 106/66   Pulse 65   Temp (!) 97.3 F (36.3 C) (Temporal)   Ht 4\' 11"  (1.499 m)   Wt 229 lb (103.9 kg)   SpO2 99%   BMI 46.25 kg/m     Wt Readings from Last 3 Encounters:  02/18/19 229 lb (103.9 kg)  01/06/19 226 lb (102.5 kg)  12/17/18 224 lb (101.6 kg)     GEN: Morbidly obese AA female in no acute  distress HEENT: blind NECK: No JVD; No carotid bruits LYMPHATICS: No lymphadenopathy CARDIAC: RRR, no murmurs, rubs, gallops RESPIRATORY:  Clear to auscultation without rales, wheezing or rhonchi  ABDOMEN: Soft, non-tender, non-distended MUSCULOSKELETAL:  Trace LE edema, Rt heel callous is very painful to touch, no obvious drainage, warmth or redness  but a central blanched area is noted. SKIN: Warm and dry NEUROLOGIC:  Alert and oriented x 3 PSYCHIATRIC:  Normal affect   ASSESSMENT:    Pre-operative cardiovascular examination Seen 02/18/2019 for pre op clearance prior to Rt trigger finger release   Type 2 diabetes mellitus with complication, with long-term current use of insulin (HCC) Retinopathy (blind), nephropathy, neuropathy. She does have a painful heel callous- she is followed at [facility] and I have asked her to contact them for follow up.   Morbid obesity (HCC) BMI 46  Obstructive sleep apnea on CPAP Compliant with C-pap  Chronic diastolic heart failure ([place]) Echo Nov 2019- EF 60-65%, grade 1DD  CRI (chronic renal insufficiency), stage 3 (moderate) Baseline SCr 1.8-2.0  PLAN:    Pt is cleared for surgery from a cardiac standpoint.  I will forward official clearance via [place] fax.  I have asked the patient to contact [facility] for further evaluation of her right heel.  No change in diuretic dosing.  Check BMP today. Keep f/u with CHF in Dec.   Medication Adjustments/Labs and Tests Ordered: Current medicines are reviewed at length with the patient today.  Concerns regarding medicines are outlined above.  Orders Placed This Encounter  Procedures  . Basic Metabolic Panel (BMET)  . EKG 12-Lead   No orders of the defined types were placed in this encounter.  Patient Instructions  Medication Instructions:  Your physician recommends that you continue on your current medications as directed. Please refer to the Current Medication list given to you  today. If you need a refill on your cardiac medications before your next appointment, please call your pharmacy.   Lab work: Your physician recommends that you return for lab work in: TODAY-BMET If you have labs (blood work) drawn today and your tests are completely normal, you will receive your results only by: Marland Kitchen MyChart Message (if you have MyChart) OR . A paper copy in the mail If you have any lab test that is abnormal or we need to change your treatment, we will call you to review the results.  Testing/Procedures: NONE   Follow-Up: At [facility], you and your health needs are our priority.  As part of our continuing mission to provide you with exceptional heart care, we have created designated Provider Care Teams.  These Care Teams include your primary Cardiologist (physician) and Advanced Practice Providers (APPs -  Physician Assistants and Nurse Practitioners) who all work together to provide you with the care you need, when you need it.  Marland Kitchen Amy [name] RECOMMENDS YOU FOLLOW UP WITH [facility] IN THE HEART FAILURE CLINIC AS SCHEDULED    Any Other Special Instructions Will Be Listed Below (If Applicable). CONTACT [facility] ABOUT YOUR HEEL      Signed, Kerin Ransom, PA-C  02/18/2019 11:14 AM    [place]

## 2019-02-18 NOTE — Assessment & Plan Note (Signed)
BMI 46 ?

## 2019-02-18 NOTE — Patient Instructions (Signed)
Medication Instructions:  Your physician recommends that you continue on your current medications as directed. Please refer to the Current Medication list given to you today. If you need a refill on your cardiac medications before your next appointment, please call your pharmacy.   Lab work: Your physician recommends that you return for lab work in: TODAY-BMET If you have labs (blood work) drawn today and your tests are completely normal, you will receive your results only by: Marland Kitchen MyChart Message (if you have MyChart) OR . A paper copy in the mail If you have any lab test that is abnormal or we need to change your treatment, we will call you to review the results.  Testing/Procedures: NONE   Follow-Up: At [facility], you and your health needs are our priority.  As part of our continuing mission to provide you with exceptional heart care, we have created designated Provider Care Teams.  These Care Teams include your primary Cardiologist (physician) and Advanced Practice Providers (APPs -  Physician Assistants and Nurse Practitioners) who all work together to provide you with the care you need, when you need it.  Marland Kitchen LUKE RECOMMENDS YOU FOLLOW UP WITH AMY IN THE HEART FAILURE CLINIC AS SCHEDULED    Any Other Special Instructions Will Be Listed Below (If Applicable). CONTACT [facility] ABOUT YOUR HEEL

## 2019-02-18 NOTE — Assessment & Plan Note (Signed)
Echo Nov 2019- EF 60-65%, grade 1DD

## 2019-02-18 NOTE — Assessment & Plan Note (Signed)
Seen 02/18/2019 for pre op clearance prior to Rt trigger finger release

## 2019-02-18 NOTE — Assessment & Plan Note (Addendum)
Retinopathy (blind), nephropathy, neuropathy. She does have a painful heel callous- she is followed at [facility] and I have asked her to contact them for follow up.

## 2019-02-19 ENCOUNTER — Ambulatory Visit: Payer: Medicare Other | Admitting: Podiatry

## 2019-02-19 LAB — BASIC METABOLIC PANEL
BUN/Creatinine Ratio: 33 — ABNORMAL HIGH (ref 9–23)
BUN: 51 mg/dL — ABNORMAL HIGH (ref 6–24)
CO2: 29 mmol/L (ref 20–29)
Calcium: 9.5 mg/dL (ref 8.7–10.2)
Chloride: 105 mmol/L (ref 96–106)
Creatinine, Ser: 1.55 mg/dL — ABNORMAL HIGH (ref 0.57–1.00)
GFR calc Af Amer: 42 mL/min/{1.73_m2} — ABNORMAL LOW (ref >59)
GFR calc non Af Amer: 36 mL/min/{1.73_m2} — ABNORMAL LOW (ref >59)
Glucose: 65 mg/dL (ref 65–99)
Potassium: 4.7 mmol/L (ref 3.5–5.2)
Sodium: 148 mmol/L — ABNORMAL HIGH (ref 134–144)

## 2019-03-05 ENCOUNTER — Ambulatory Visit (INDEPENDENT_AMBULATORY_CARE_PROVIDER_SITE_OTHER): Payer: Medicare Other | Admitting: Podiatry

## 2019-03-05 ENCOUNTER — Other Ambulatory Visit: Payer: Self-pay

## 2019-03-05 ENCOUNTER — Encounter: Payer: Self-pay | Admitting: Podiatry

## 2019-03-05 DIAGNOSIS — L97519 Non-pressure chronic ulcer of other part of right foot with unspecified severity: Secondary | ICD-10-CM | POA: Diagnosis not present

## 2019-03-05 DIAGNOSIS — R2681 Unsteadiness on feet: Secondary | ICD-10-CM

## 2019-03-05 DIAGNOSIS — E1142 Type 2 diabetes mellitus with diabetic polyneuropathy: Secondary | ICD-10-CM

## 2019-03-05 NOTE — Progress Notes (Signed)
Subjective:  Patient ID: Amy [name], female    DOB: July 19, 1960,  MRN: [name]  Chief Complaint  Patient presents with  . Wound Check    Right posterior heel wound check. Pt states she is improving and no longer has no pain.    58 y.o. female presents for wound care.  She presents today without any acute complaints.  She states that her wound on the right heel has been doing much better.  She has been applying triple antibiotic and a Band-Aid to the wound.  She denies any other acute complaints.  She is ambulating today with walker.  She states that she has occasional falling.  She has gait instability even with a walker.  She has had 3 previous falls in the past.   Review of Systems: Negative except as noted in the HPI. Denies N/V/F/Ch.  Past Medical History:  Diagnosis Date  . Arthritis   . Asthma   . Blind   . Blindness and low vision    left eye glass eye,  legally blind in right eye  . Diabetes mellitus   . Diabetic neuropathy ([place])   . Hyperlipidemia   . Hypertension   . Hypothyroidism   . Spinal stenosis     Current Outpatient Medications:  .  albuterol (PROVENTIL HFA;VENTOLIN HFA) 108 (90 BASE) MCG/ACT inhaler, Inhale 2 puffs into the lungs every 4 (four) hours as needed for wheezing or shortness of breath. For short, Disp: , Rfl:  .  aspirin EC 81 MG tablet, Take 81 mg by mouth daily., Disp: , Rfl:  .  azithromycin (ZITHROMAX) 250 MG tablet, , Disp: , Rfl:  .  B-D UF III MINI PEN NEEDLES 31G X 5 MM MISC, U 1 PEN NEEDLE QID WITH EACH INJECTION, Disp: , Rfl:  .  cetirizine (ZYRTEC) 10 MG tablet, Take 10 mg by mouth at bedtime. , Disp: , Rfl:  .  cyclobenzaprine (FLEXERIL) 10 MG tablet, Take 1 tablet (10 mg total) by mouth 2 (two) times daily as needed for muscle spasms., Disp: 20 tablet, Rfl: 0 .  esomeprazole (NEXIUM) 40 MG capsule, Take 40 mg by mouth daily. , Disp: , Rfl:  .  fluticasone (FLONASE) 50 MCG/ACT nasal spray, Place 1 spray into both nostrils daily. ,  Disp: , Rfl:  .  guaiFENesin (MUCINEX) 600 MG 12 hr tablet, Take 1 tablet (600 mg total) by mouth 2 (two) times daily., Disp: 60 tablet, Rfl: 0 .  HUMALOG KWIKPEN 100 UNIT/ML KiwkPen, Inject 8-14 Units into the skin See admin instructions. Inject 8 units in morning, 12 units at lunch, and 16 units at dinner, Disp: , Rfl:  .  levothyroxine (SYNTHROID, LEVOTHROID) 150 MCG tablet, Take 150 mcg by mouth daily before breakfast., Disp: , Rfl:  .  Linaclotide (LINZESS) 145 MCG CAPS capsule, Take 145 mcg by mouth daily as needed (constipation). , Disp: , Rfl:  .  metolazone (ZAROXOLYN) 2.5 MG tablet, Take 1 tablet (2.5 mg total) by mouth daily., Disp: 30 tablet, Rfl: 3 .  potassium chloride SA (K-DUR,KLOR-CON) 20 MEQ tablet, TAKE 1 TABLET (20 MEQ TOTAL) BY MOUTH DAILY (Patient taking differently: Take 20 mEq by mouth daily. ), Disp: 65 tablet, Rfl: 2 .  predniSONE (DELTASONE) 20 MG tablet, Take 1 tablet (20 mg total) by mouth 2 (two) times daily with a meal., Disp: 10 tablet, Rfl: 0 .  pregabalin (LYRICA) 150 MG capsule, Take 150 mg by mouth 3 (three) times daily. , Disp: , Rfl:  .  simvastatin (ZOCOR) 20 MG tablet, Take 20 mg by mouth every evening. , Disp: , Rfl:  .  spironolactone (ALDACTONE) 25 MG tablet, TAKE 1/2 TABLET BY MOUTH DAILY, Disp: 315 tablet, Rfl: 0 .  torsemide (DEMADEX) 20 MG tablet, Take 4 tablets (80 mg total) by mouth daily., Disp: 120 tablet, Rfl: 3 .  TRESIBA FLEXTOUCH 200 UNIT/ML SOPN, Inject 60 Units into the skin at bedtime. , Disp: , Rfl:  .  triamcinolone cream (KENALOG) 0.1 %, Apply 1 application topically daily as needed (irritation). , Disp: , Rfl:   Social History   Tobacco Use  Smoking Status Former Smoker  . Types: Cigarettes  . Quit date: 05/22/1992  . Years since quitting: 26.8  Smokeless Tobacco Never Used    Allergies  Allergen Reactions  . Penicillins Hives and Swelling    Has patient had a PCN reaction causing immediate rash, facial/tongue/throat swelling,  SOB or lightheadedness with hypotension: yes- face swelling Has patient had a PCN reaction causing severe rash involving mucus membranes or skin necrosis: no Has patient had a PCN reaction that required hospitalization unknown (childhood allergy) Has patient had a PCN reaction occurring within the last 10 years: no If all of the above answers are "NO", then may proceed with Cephalosporin use.   . Vicodin [Hydrocodone-Acetaminophen] Nausea And Vomiting  . Codeine Nausea Only  . Iodine-131 Hives  . Iohexol Hives    Pt developed itching and hives along with nasal congestion; needs 13 hour premeds for future studies, Onset Date: [id number]   . Lisinopril Cough  . Sulfa Antibiotics Nausea And Vomiting   Objective:  There were no vitals filed for this visit. There is no height or weight on file to calculate BMI. Constitutional Well developed. Well nourished.  Vascular Dorsalis pedis pulses palpable bilaterally. Posterior tibial pulses palpable bilaterally. Capillary refill normal to all digits.  No cyanosis or clubbing noted. Pedal hair growth normal.  Neurologic Normal speech. Oriented to person, place, and time. Protective sensation absent  Dermatologic  right heel ulceration.  Hyperkeratotic lesions noted in the periwound.  The wound has reepithelialized.  No clinical signs of infection noted.  Orthopedic: No pain to palpation either foot.  Her gait was evaluated.  It appears to be antalgic with gait instability noted.  Small stride slow walking noted.   Radiographs: None Assessment:  No diagnosis found. Plan:  Patient was evaluated and treated and all questions answered.  Ulcer right heel ulceration -The ulceration has completely resolved.  The epidermal lysis and the thickening of the skin was debrided down to healthy epithelialized skin.  No clinical signs of infection noted.  Gait instability -It was determined that patient has an antalgic gait with instability while walking  with small length stride. -I think patient will benefit from use balance bracing and given her history of previous falls and instability I think patient would benefit from it. -Patient will be scheduled to see [facility] for balance bracing    No follow-ups on file.

## 2019-03-13 ENCOUNTER — Other Ambulatory Visit: Payer: Self-pay

## 2019-03-13 ENCOUNTER — Encounter: Payer: Self-pay | Admitting: Podiatry

## 2019-03-13 ENCOUNTER — Ambulatory Visit (INDEPENDENT_AMBULATORY_CARE_PROVIDER_SITE_OTHER): Payer: Medicare Other | Admitting: Podiatry

## 2019-03-13 ENCOUNTER — Ambulatory Visit: Payer: Medicare Other | Admitting: Orthotics

## 2019-03-13 DIAGNOSIS — M79676 Pain in unspecified toe(s): Secondary | ICD-10-CM | POA: Diagnosis not present

## 2019-03-13 DIAGNOSIS — E1142 Type 2 diabetes mellitus with diabetic polyneuropathy: Secondary | ICD-10-CM

## 2019-03-13 DIAGNOSIS — B351 Tinea unguium: Secondary | ICD-10-CM

## 2019-03-13 DIAGNOSIS — R2681 Unsteadiness on feet: Secondary | ICD-10-CM

## 2019-03-13 DIAGNOSIS — L608 Other nail disorders: Secondary | ICD-10-CM

## 2019-03-13 DIAGNOSIS — L84 Corns and callosities: Secondary | ICD-10-CM

## 2019-03-13 NOTE — Progress Notes (Signed)
  Subjective:  Patient ID: Amy [name], female    DOB: 1960/06/28,  MRN: [id number]  Chief Complaint  Patient presents with  . Nail Problem    i need to get my toenails trimmed   . Foot Ulcer    the spot on the back of the right heel is better and sore and does have some peeling and cracking    58 y.o. female returns for the above complaint.  She states that she is doing much better her right heel has completely healed.  She states that her toenails have been painful.  She denies any other complaints.  Objective:  There were no vitals filed for this visit. Podiatric Exam: Vascular: dorsalis pedis and posterior tibial pulses are palpable bilateral. Capillary return is immediate. Temperature gradient is WNL. Skin turgor WNL  Sensorium: Normal Semmes Weinstein monofilament test. Normal tactile sensation bilaterally. Nail Exam: Pt has thick disfigured discolored nails with subungual debris noted bilateral entire nail hallux through fifth toenails Ulcer Exam: There is no evidence of ulcer or pre-ulcerative changes or infection. Orthopedic Exam: Muscle tone and strength are WNL. No limitations in general ROM. No crepitus or effusions noted. HAV  B/L.  Hammer toes 2-5  B/L. Skin: No Porokeratosis. No infection or ulcers  Assessment & Plan:  Patient was evaluated and treated and all questions answered.  Onychomycosis with pain  -Nails palliatively debrided as below. -Educated on self-care  -Patient is scheduled to see Amy [name] for [employer] bracing.   Procedure: Nail Debridement Rationale: pain  Type of Debridement: manual, sharp debridement. Instrumentation: Nail nipper, rotary burr. Number of Nails: 10  Procedures and Treatment: Consent by patient was obtained for treatment procedures. The patient understood the discussion of treatment and procedures well. All questions were answered thoroughly reviewed. Debridement of mycotic and hypertrophic toenails, 1 through 5 bilateral and  clearing of subungual debris. No ulceration, no infection noted.  Return Visit-Office Procedure: Patient instructed to return to the office for a follow up visit 3 months for continued evaluation and treatment.  Amy [name], DPM    Return in about 3 months (around 06/13/2019).

## 2019-03-13 NOTE — Progress Notes (Signed)
Patient came in today for Eval/assessment for Moore Balance Brace.  Patient presents with a history of falling, and also fearful of falling.  Balance tests performed and patient does have issues keeping balance especially in foot to heel test.  Patient has week dorsiflexors and well as inverters/everters muscle group.  Demonstrants ankle instability.   

## 2019-03-19 IMAGING — CT CT CHEST W/O CM
2 of 4 series · 15 of 36 positions shown, 18 images · non-contrast
Comparison: Chest radiograph February 04, 2017 and October 17, 2016

CLINICAL DATA: Pulmonary vascular congestion with questionable
hilar fullness

EXAM:
CT CHEST WITHOUT CONTRAST
TECHNIQUE: Multidetector CT imaging of the chest was performed following the
standard protocol without IV contrast. Intravenous contrast was not
administered due to history of allergic reaction to iodinated
contrast material.

[Series 3: chest w/o · axial · non-contrast · 0.77mm/px · z∈[-226,-20]mm · 12 of 98 slices shown, 15 images]
[im 8/98  mediastinal]
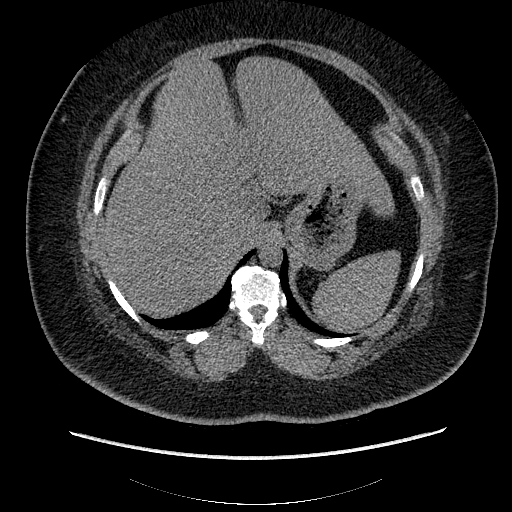
[im 8/98  lung]
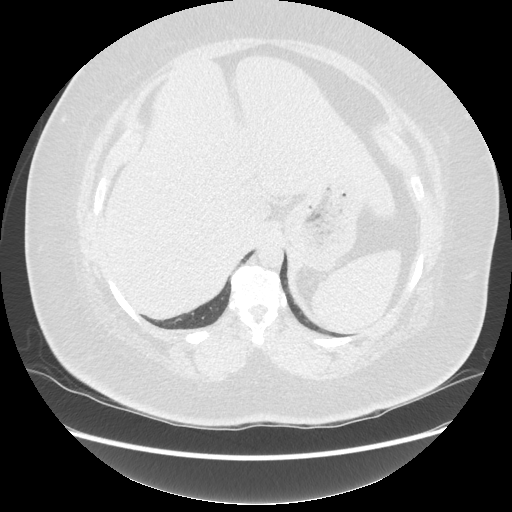
[im 15/98  lung]
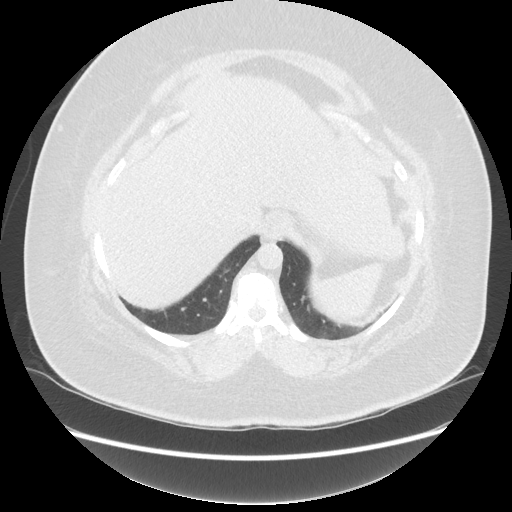
[im 23/98  lung]
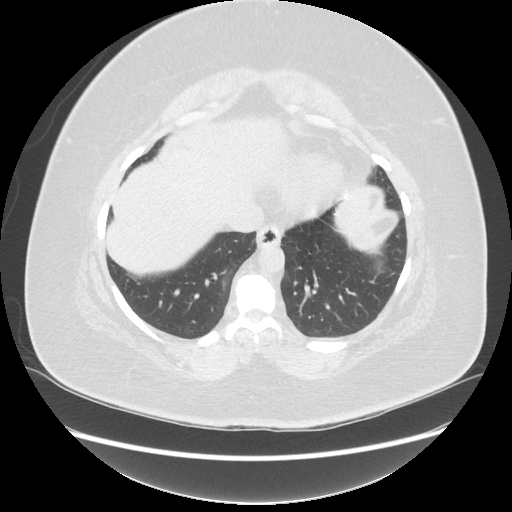
[im 30/98  lung]
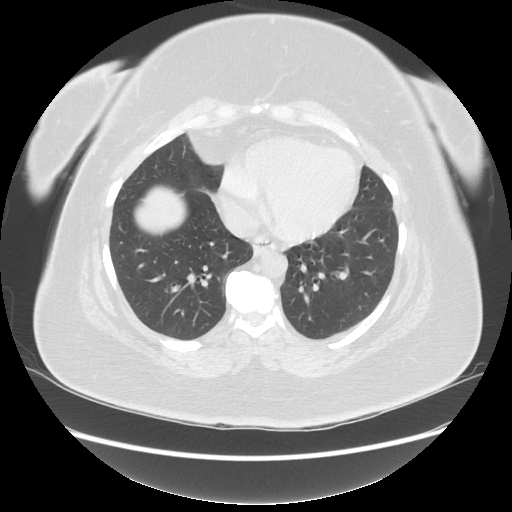
[im 38/98  mediastinal]
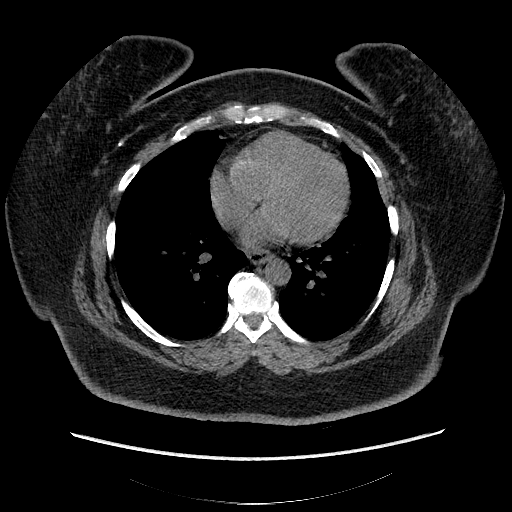
[im 38/98  lung]
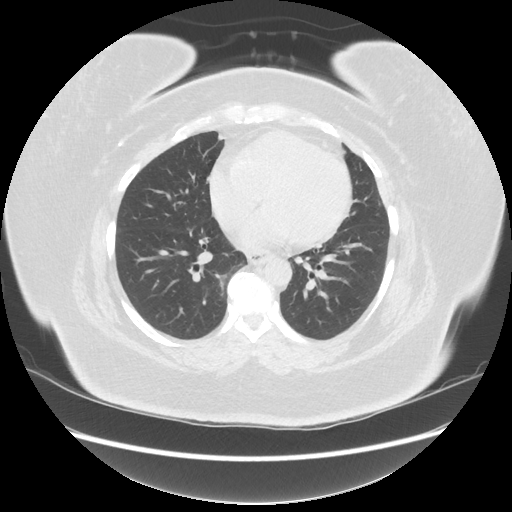
[im 45/98  lung]
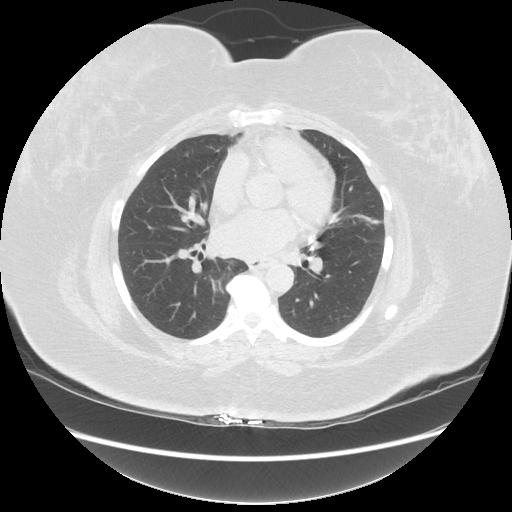
[im 53/98  lung]
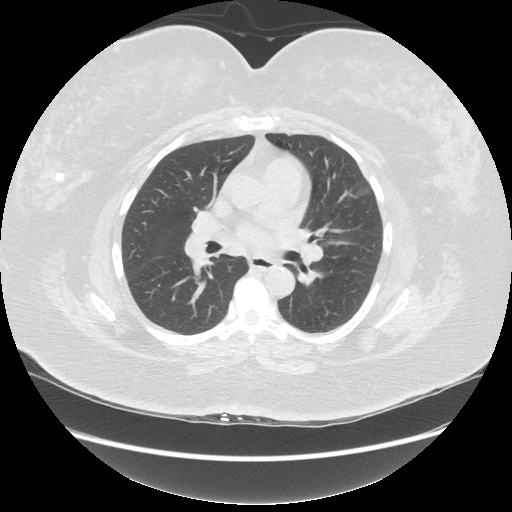
[im 60/98  lung]
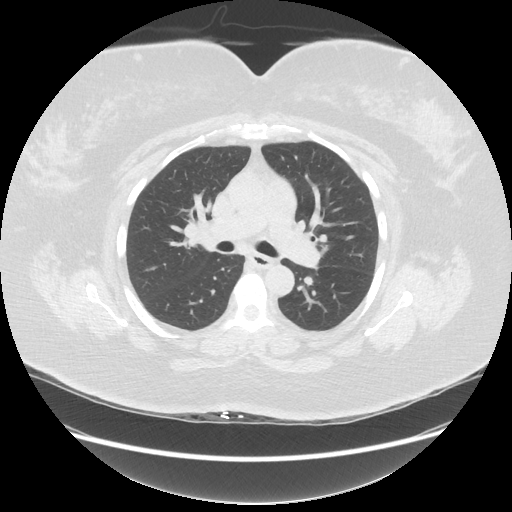
[im 68/98  mediastinal]
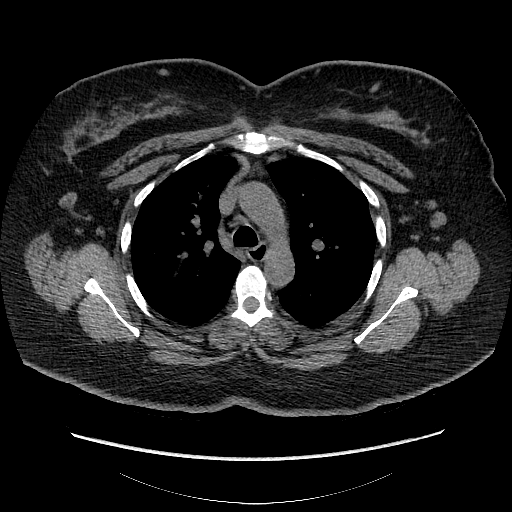
[im 68/98  lung]
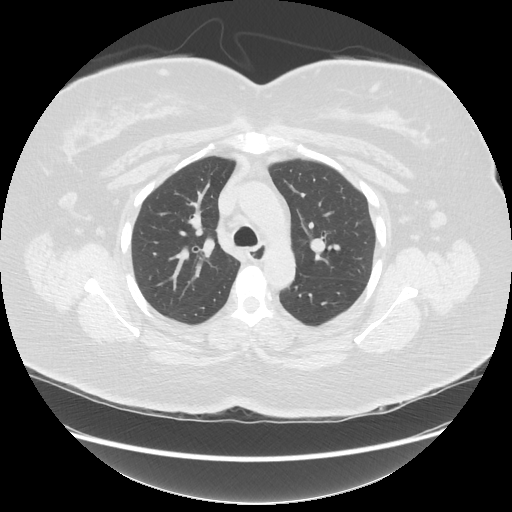
[im 75/98  lung]
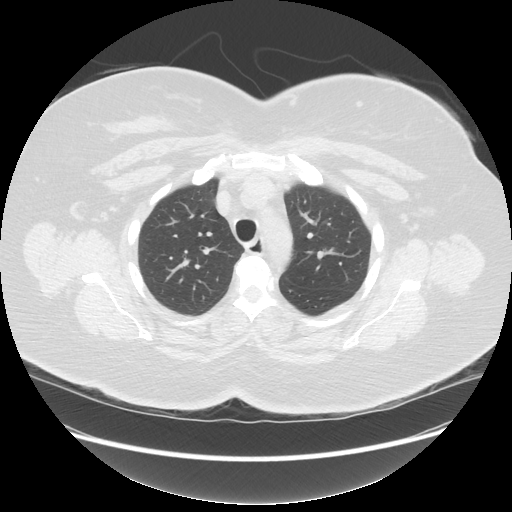
[im 83/98  lung]
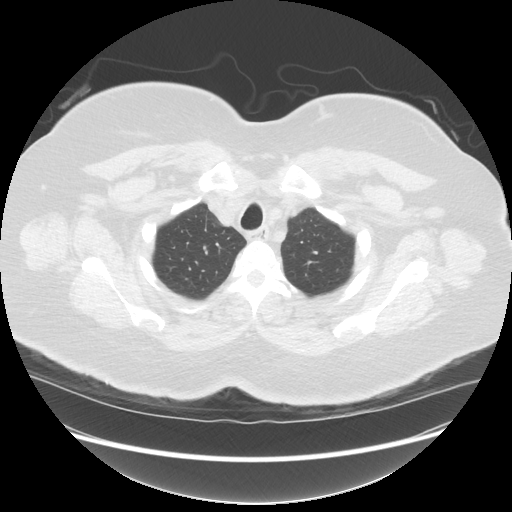
[im 90/98  lung]
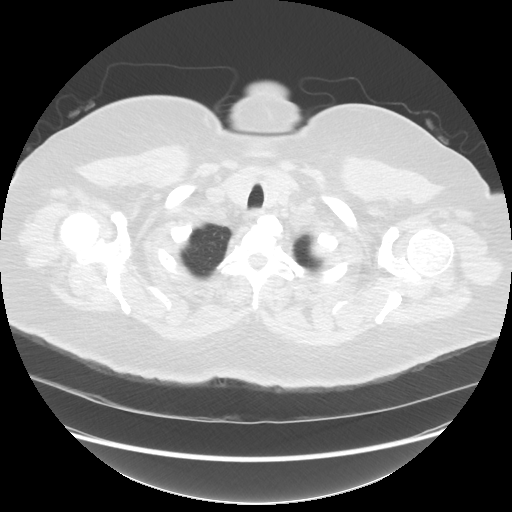

[Series 103: cor · coronal · 0.59mm/px · 3 of 104 slices shown]
[im 21/104  lung]
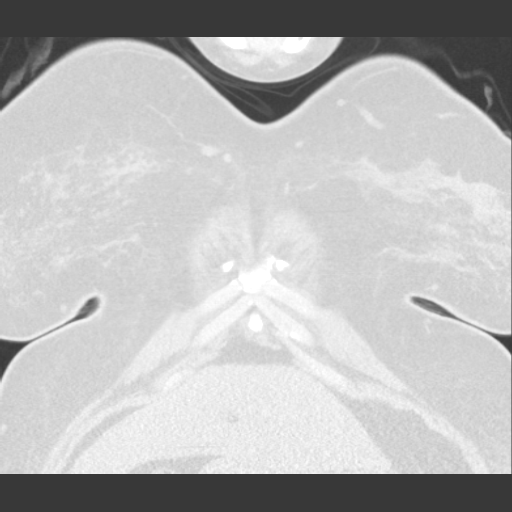
[im 42/104  lung]
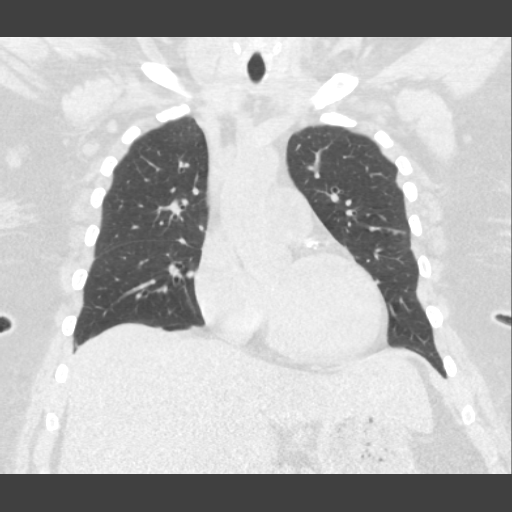
[im 62/104  lung]
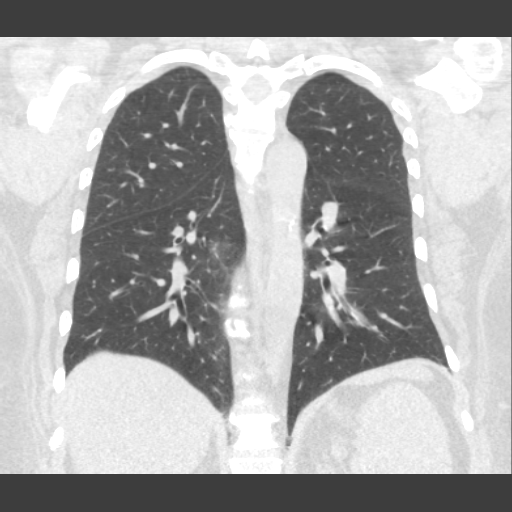

[15 of 36 positions shown; findings below may reference images not displayed]

FINDINGS: Cardiovascular: There is no thoracic aortic aneurysm. The visualized
great vessels appear unremarkable. Pericardium is not appreciably
thickened. There are foci of coronary artery calcification.

Mediastinum/Nodes: Thyroid appears normal. There are scattered
subcentimeter mediastinal lymph nodes. There is no lymph node
meeting size criteria for pathologic significance in the thoracic
region. There is a small hiatal hernia.

Lungs/Pleura: There is mild atelectatic change in the lingula and
left base regions. There is no edema or consolidation. No pleural
effusion or pleural thickening.

Upper Abdomen: The visualized upper abdominal structures appear
unremarkable.

Musculoskeletal: Degenerative change is present in the lower
thoracic spine. There are no blastic or lytic bone lesions. A focus
of benign-appearing calcification is noted in the right breast.
IMPRESSION: 1. No edema or consolidation. There is no evident pulmonary vascular
congestion by CT currently.

2. No adenopathy appreciable by size criteria. There are scattered
subcentimeter mediastinal and axillary lymph nodes.

3.  There are foci of coronary artery calcification.

4.  Small hiatal hernia.

## 2019-03-22 ENCOUNTER — Ambulatory Visit: Payer: Medicare Other | Admitting: Podiatry

## 2019-04-18 ENCOUNTER — Encounter (HOSPITAL_COMMUNITY): Payer: Medicare Other

## 2019-04-22 ENCOUNTER — Ambulatory Visit: Payer: Medicare Other | Admitting: Orthotics

## 2019-04-22 ENCOUNTER — Other Ambulatory Visit: Payer: Self-pay

## 2019-04-22 DIAGNOSIS — L84 Corns and callosities: Secondary | ICD-10-CM

## 2019-04-22 DIAGNOSIS — M2042 Other hammer toe(s) (acquired), left foot: Secondary | ICD-10-CM

## 2019-04-22 DIAGNOSIS — M2041 Other hammer toe(s) (acquired), right foot: Secondary | ICD-10-CM

## 2019-04-22 DIAGNOSIS — L97519 Non-pressure chronic ulcer of other part of right foot with unspecified severity: Secondary | ICD-10-CM

## 2019-04-22 DIAGNOSIS — R2681 Unsteadiness on feet: Secondary | ICD-10-CM

## 2019-04-22 DIAGNOSIS — L608 Other nail disorders: Secondary | ICD-10-CM

## 2019-04-22 DIAGNOSIS — E1142 Type 2 diabetes mellitus with diabetic polyneuropathy: Secondary | ICD-10-CM

## 2019-04-22 NOTE — Progress Notes (Signed)
Patient didn't pick up brace today b/c she needs a large A3200 shoe (7W)

## 2019-05-06 ENCOUNTER — Encounter: Payer: Self-pay | Admitting: General Practice

## 2019-05-06 ENCOUNTER — Telehealth: Payer: Self-pay | Admitting: Cardiovascular Disease

## 2019-05-06 NOTE — Telephone Encounter (Signed)
Pt c/o BP issue: STAT if pt c/o blurred vision, one-sided weakness or slurred speech  1. What are your last 5 BP readings? She does not remember, [employer] keeps record of it.   2. Are you having any other symptoms (ex. Dizziness, headache, blurred vision, passed out)? States she sees floaters  3. What is your BP issue? Patient states her BP has been dropping. Denies any other symptoms besides seeing floaters.

## 2019-05-06 NOTE — Telephone Encounter (Signed)
Pt informed of providers result & recommendations. Pt verbalized understanding. No further questions . She will call her eye MD and see what he says

## 2019-05-06 NOTE — Telephone Encounter (Signed)
There are many reasons floaters can appear.  Low blood pressure is usually not one of those.  If her pressure is too low, she would feel dizziness and maybe see stars or flashes.  Dehydration is a common cause of floaters.  If she doesn't feel that this is the issue, would recommend she contact her optometrist or ophthalmologist.

## 2019-05-14 ENCOUNTER — Ambulatory Visit (INDEPENDENT_AMBULATORY_CARE_PROVIDER_SITE_OTHER): Payer: Medicare Other | Admitting: Orthotics

## 2019-05-14 ENCOUNTER — Other Ambulatory Visit: Payer: Self-pay

## 2019-05-14 DIAGNOSIS — M2041 Other hammer toe(s) (acquired), right foot: Secondary | ICD-10-CM

## 2019-05-14 DIAGNOSIS — M2042 Other hammer toe(s) (acquired), left foot: Secondary | ICD-10-CM | POA: Diagnosis not present

## 2019-05-14 DIAGNOSIS — E1142 Type 2 diabetes mellitus with diabetic polyneuropathy: Secondary | ICD-10-CM

## 2019-05-14 DIAGNOSIS — L97511 Non-pressure chronic ulcer of other part of right foot limited to breakdown of skin: Secondary | ICD-10-CM

## 2019-05-14 DIAGNOSIS — R2681 Unsteadiness on feet: Secondary | ICD-10-CM

## 2019-05-14 NOTE — Progress Notes (Signed)
Patient came in today to pick up Moore Balance Brace (Bilateral).   Patient was able to don brace independently and brace was check for fit to custom.   Patient was observed walking with brace and gait was improved as well as stability.   Patient was advised of care and wearing instructions.  Advised to notify practice if there were any issues; especially skin irritation.  

## 2019-05-29 ENCOUNTER — Encounter: Payer: Self-pay | Admitting: *Deleted

## 2019-05-29 ENCOUNTER — Other Ambulatory Visit: Payer: Self-pay | Admitting: *Deleted

## 2019-05-29 NOTE — Patient Outreach (Signed)
[facility]) Care Management  05/29/2019  Amy [name] May 18, 1960 [name]   Referral Date: 1/11 Referral Source: MD office  Referral Reason: Patient is blind, need resources Insurance: [place] attempt #1, successful.  Member identified identity.  This care manager introduced self and stated purpose of call.  [facility] care management services explained.  Social: She is blind and lives alone, has one adult son in the area, report having other friends but state they are not available to help as much as she need them.  She has an aide to come in daily from 8-230 pm, but state she has been having trouble with them providing assistance.  Report she was told by her current aide that she was not able to cook or perform certain duties for her but was to simply help her do them for herself.  She does not like to cook as much as she has frequently burned herself on the stove.  She is interested in going to ALF but unsure of how it all works.  Advised that social worker will be available to discuss specifics with her.  Conditions: Per chart has history of CHF, HTN, DM (A1C-10.7), hypothyroidism, HLD, and chronic renal insufficiency.  Does not check blood sugars daily, state she is tired of sticking her finger.  She inquired about the [facility] but was told she didn't qualify for it because she was blind.  She does monitor her weights and blood pressure daily through telemonitoring program with [facility].  She does not always follow her recommended diet, state she "eat what I can afford."  She does have food stamps twice a month.    Medications: Reviewed with member but she state she only manages her insulin.  She has a nurse that comes to her home every 2 weeks and fills a pill box.  Denies needing medications assistance to afford meds.  Appointments: Had appointment with PCP last week, will have appointment with podiatrist on 1/29.    Advance Directives: Does not have POA or  living will, but interested in obtaining information.  Plan: RN CM will place referral to social worker and follow up with member within the next 2 weeks.  Fall Risk  05/29/2019 02/09/2017 01/25/2016  Falls in the past year? 0 No No  Number falls in past yr: 0 - -  Injury with Fall? 0 - -   Depression screen PHQ 2/9 05/29/2019  Decreased Interest 0  Down, Depressed, Hopeless 1  PHQ - 2 Score 1  Some recent data might be hidden   [facility] CM Care Plan Problem One     Most Recent Value  Care Plan Problem One  Knowledge deficit regarding diabetes management as evidenced by elevated A1C  Role Documenting the Problem One  Care Management [place] for Problem One  Active  THN Long Term Goal   Member will report A1C decrease to </= 8 within the next 3 months  THN Long Term Goal Start Date  05/29/19  Interventions for Problem One Long Term Goal  Educated on effective management of diabetes, including medication management as well as diet control and checking blood sugars  THN CM Short Term Goal #1   Member will report plan for increased support or decision for ALF within the next 2 weeks  THN CM Short Term Goal #1 Start Date  05/29/19  Interventions for Short Term Goal #1  Referral placed to social worker for community resources and assistance with  potential ALF placement  THN CM Short Term Goal #2   Member will report checking blood sugars at least daily over the next 3 weeks  THN CM Short Term Goal #2 Start Date  05/29/19  Interventions for Short Term Goal #2  Educated member on imprtance of daily blood sugar checks in effort to manage diabetes more effectively.     Valente David, [place], MSN [place] [phone]

## 2019-06-03 ENCOUNTER — Other Ambulatory Visit: Payer: Self-pay

## 2019-06-03 NOTE — Patient Outreach (Signed)
[facility]) Care Management  06/03/2019  Amy [name] 1960-10-11 [name]   Social work referral received on 05/31/19 from [facility], [employer].  "Patient is blind, lives alone. Interested in more information about ALF. Also want more information about advanced life planning. Has aide daily, but aide only performing limited duties, doesn't always help with preparing meals".  Successful outreach to patient today.  Patient inquired about difference between SNF and ALF.  Patient denies need for 24/7 assistance and inquired further about ALF.   Educated patient about selecting facilities of interest, obtaining FL2, having Medicaid adjusted to Long-Term Care Medicaid if eligible.  Patient stated that she really prefers to remain at home but needs someone to assist with checking blood sugar.  Current pcs aide is not a Physiological scientist so she is unable to assist with this. Patient has not contacted service agency to determine if they have CNA's on staff.  Agreed to contact [employer] regarding options for this, however, they are currently closed for the [facility] holiday.  Will call tomorrow.   Patient stated that her son has offered for her to move in with him but she does not wish to do this.   Patient did request that a list of ALF's be mailed so that she and son can review them.  Will also mail Advance Directive [place] and packet.     Will follow up with patient after contacting [facility]. Will also follow up within the next two weeks to ensure receipt of documentation mailed and to determine if patient wants to pursue locating ALF.   Ronn Melena, BSW Social Worker [phone]

## 2019-06-04 ENCOUNTER — Other Ambulatory Visit: Payer: Self-pay

## 2019-06-04 NOTE — Patient Outreach (Signed)
[facility]) Care Management  06/04/2019  Amy [name] [birth year] [name]  Spoke to representative at [facility] who confirmed that patient is eligible for CMA services; current aide is not certified and cannot assist patient with checking blood sugar. Called patient to inform her that she is eligible for CMA.  Encouraged her to contact [facility] to request change to CMA so that she may have assistance with checking blood sugars.  Informed patient that she may have to switch to another agency if [facility] does not have CMA with availability.   Patient requested call back next week for follow up.   Ronn Melena, BSW Social Worker [phone]

## 2019-06-10 ENCOUNTER — Other Ambulatory Visit: Payer: Self-pay

## 2019-06-10 NOTE — Patient Outreach (Signed)
[facility]) Care Management  06/10/2019  Amy [name] [birth year] [name]   Successful follow up call to patient today.  She did contact [facility] and was told that, by law, CNA:s are unable to assist with checking blood sugar.  Patient stated today that she does not want to go to ALF or Nursing Home.  She prefers to remain in the home but does need assistance with checking blood sugar.  Will collaborate with [place], Valente David, for suggestions and follow up with patient.  Ronn Melena, BSW Social Worker [phone]

## 2019-06-11 ENCOUNTER — Ambulatory Visit: Payer: Medicare Other

## 2019-06-11 ENCOUNTER — Other Ambulatory Visit: Payer: Self-pay

## 2019-06-11 ENCOUNTER — Other Ambulatory Visit: Payer: Self-pay | Admitting: *Deleted

## 2019-06-11 NOTE — Patient Outreach (Signed)
[facility]) Care Management  06/11/2019  YUXIN HUDACK [birth year] [id number]   Follow up call to patient today.  Talked with her about [facility] as potential option for services since she desires to remain in her home but does need assistance.   Patient is considering changing to a different pcs agency as she states that current aide does not complete all tasks that she is supposed to.  Informed patient that change in agency must go through [employer].  Provided her with customer service number and informed her that [place] can provide options for pcs agencies in the area.  Will follow up with patient again next week.  Ronn Melena, BSW Social Worker [phone]

## 2019-06-11 NOTE — Patient Outreach (Signed)
[facility]) Care Management  06/11/2019  Amy [name] [birth year] [name]   Call placed to member to follow up on diabetes management and to complete initial assessment.  She report she is doing well, still trying to find a way to consistently monitor her blood sugars.  She has looked into having her home aide do this but was told that this was not one of their duties.  Report she has managed to check today and yesterday with the help of friends, today was 195 and yesterday was 162.  She is still not happy with her current aide as she reportedly does not provided the help and assistance needed, willing to switch agencies.  She will inquire about the duties of the aide (to make sure they can help monitor blood sugar) with other agencies prior to changing.  She has decided not to pursue placement at ALF, will stay in the home with the help of family/friends.  Denies any urgent concerns at this time, will follow up within the next month.  Will also have BSW contact member for list of new home care agencies.  THN CM Care Plan Problem One     Most Recent Value  Care Plan Problem One  Knowledge deficit regarding diabetes management as evidenced by elevated A1C  Role Documenting the Problem One  Care Management [place] for Problem One  Active  THN Long Term Goal   Member will report A1C decrease to </= 8 within the next 3 months  THN Long Term Goal Start Date  05/29/19  Interventions for Problem One Long Term Goal  Member educated on complications of uncontrolled diabetes and importance of managing daily blood sugars in effort to decrease A1C  THN CM Short Term Goal #1   Member will report plan for increased support or decision for ALF within the next 2 weeks  THN CM Short Term Goal #1 Start Date  05/29/19  Interventions for Short Term Goal #1  Request BSW to contact member with agencies to change home aide care in effort to increase assistance  THN CM Short Term  Goal #2   Member will report checking blood sugars at least daily over the next 3 weeks  THN CM Short Term Goal #2 Start Date  05/29/19  Interventions for Short Term Goal #2  Discussed with member possibility of requesting help from family/friends to check blood sugar regularly until she has consistent and dependable help to check daily.     Valente David, [place], MSN [place] [phone]

## 2019-06-14 ENCOUNTER — Other Ambulatory Visit: Payer: Medicare Other | Admitting: Orthotics

## 2019-06-14 ENCOUNTER — Other Ambulatory Visit: Payer: Self-pay

## 2019-06-14 ENCOUNTER — Ambulatory Visit (INDEPENDENT_AMBULATORY_CARE_PROVIDER_SITE_OTHER): Payer: Medicare Other | Admitting: Podiatry

## 2019-06-14 ENCOUNTER — Encounter: Payer: Self-pay | Admitting: Podiatry

## 2019-06-14 DIAGNOSIS — M79676 Pain in unspecified toe(s): Secondary | ICD-10-CM | POA: Diagnosis not present

## 2019-06-14 DIAGNOSIS — E1140 Type 2 diabetes mellitus with diabetic neuropathy, unspecified: Secondary | ICD-10-CM

## 2019-06-14 DIAGNOSIS — B351 Tinea unguium: Secondary | ICD-10-CM | POA: Diagnosis not present

## 2019-06-14 DIAGNOSIS — L853 Xerosis cutis: Secondary | ICD-10-CM

## 2019-06-14 MED ORDER — AMMONIUM LACTATE 12 % EX LOTN: 1.0000 "application " | TOPICAL_LOTION | CUTANEOUS | 0 refills | Status: DC | PRN

## 2019-06-14 NOTE — Progress Notes (Signed)
  Subjective:  Patient ID: Amy [name], female    DOB: 17-Sep-1960,  MRN: [name]  Chief Complaint  Patient presents with  . Nail Problem    pt is here for routine foot care, pt also has some calluses she would like taken care of   59 y.o. female returns for the above complaint.  She states that she is doing much better her right heel has completely healed.  She states that her toenails have been painful.  She denies any other complaints.  Objective:  There were no vitals filed for this visit. Podiatric Exam: Vascular: dorsalis pedis and posterior tibial pulses are palpable bilateral. Capillary return is immediate. Temperature gradient is WNL. Skin turgor WNL  Sensorium: Normal Semmes Weinstein monofilament test. Normal tactile sensation bilaterally. Nail Exam: Pt has thick disfigured discolored nails with subungual debris noted bilateral entire nail hallux through fifth toenails Ulcer Exam: There is no evidence of ulcer or pre-ulcerative changes or infection. Orthopedic Exam: Muscle tone and strength are WNL. No limitations in general ROM. No crepitus or effusions noted. HAV  B/L.  Hammer toes 2-5  B/L. Skin: No Porokeratosis. No infection or ulcers  Assessment & Plan:  Patient was evaluated and treated and all questions answered.  Onychomycosis with pain  -Nails palliatively debrided as below. -Educated on self-care  -Patient obtained from work bracing which has been helping her.  She will be getting her diabetic shoes next week.  Procedure: Nail Debridement Rationale: pain  Type of Debridement: manual, sharp debridement. Instrumentation: Nail nipper, rotary burr. Number of Nails: 10  Xerosis of the bilateral heel -I explained to the patient the etiology of xerosis and various treatment options associated with that including over-the-counter lotion versus prescription lotion.  Given the patient has failed over-the-counter lotion I believe she will benefit from prescription  lotion.  I have dispensed ammonium lactate to the pharmacy. -  Procedures and Treatment: Consent by patient was obtained for treatment procedures. The patient understood the discussion of treatment and procedures well. All questions were answered thoroughly reviewed. Debridement of mycotic and hypertrophic toenails, 1 through 5 bilateral and clearing of subungual debris. No ulceration, no infection noted.  Return Visit-Office Procedure: Patient instructed to return to the office for a follow up visit 3 months for continued evaluation and treatment.  Boneta Lucks, DPM    No follow-ups on file. I have seen this patient

## 2019-06-17 ENCOUNTER — Other Ambulatory Visit: Payer: Self-pay

## 2019-06-17 NOTE — Patient Outreach (Signed)
[facility] Care Management  06/17/2019  CHARICE KAGLE 1960/10/13 [name]   Follow up call to patient today regarding Social Work referral.  Originally patient was expressing interest in ALF's, however, she has changed her mind about this.  During most recent outreach, patient stated that she was not pleased with her aide providing services.  She reported today that she has been in communication with her CAP worker about this and they are working to find another permanent aide for her  At this time, no other social work needs have been identified so discipline is being closed.  Did encourage patient to call if additional needs arise.     Ronn Melena, BSW Social Worker [phone]

## 2019-06-18 ENCOUNTER — Other Ambulatory Visit (HOSPITAL_COMMUNITY): Payer: Self-pay | Admitting: Adult Health

## 2019-06-27 ENCOUNTER — Encounter (HOSPITAL_COMMUNITY): Payer: Medicare Other

## 2019-07-01 ENCOUNTER — Encounter (HOSPITAL_COMMUNITY): Payer: Medicare Other

## 2019-07-08 ENCOUNTER — Other Ambulatory Visit: Payer: Self-pay

## 2019-07-08 ENCOUNTER — Emergency Department (HOSPITAL_COMMUNITY)
Admission: EM | Admit: 2019-07-08 | Discharge: 2019-07-09 | Disposition: A | Discharge status: Home / Self Care | Payer: Medicare Other | Attending: Emergency Medicine | Admitting: Emergency Medicine

## 2019-07-08 ENCOUNTER — Encounter (HOSPITAL_COMMUNITY): Payer: Self-pay | Admitting: Emergency Medicine

## 2019-07-08 DIAGNOSIS — Z7982 Long term (current) use of aspirin: Secondary | ICD-10-CM | POA: Insufficient documentation

## 2019-07-08 DIAGNOSIS — E109 Type 1 diabetes mellitus without complications: Secondary | ICD-10-CM | POA: Diagnosis not present

## 2019-07-08 DIAGNOSIS — E039 Hypothyroidism, unspecified: Secondary | ICD-10-CM | POA: Diagnosis not present

## 2019-07-08 DIAGNOSIS — M5431 Sciatica, right side: Secondary | ICD-10-CM | POA: Diagnosis not present

## 2019-07-08 DIAGNOSIS — J45909 Unspecified asthma, uncomplicated: Secondary | ICD-10-CM | POA: Diagnosis not present

## 2019-07-08 DIAGNOSIS — Z79899 Other long term (current) drug therapy: Secondary | ICD-10-CM | POA: Diagnosis not present

## 2019-07-08 DIAGNOSIS — I5032 Chronic diastolic (congestive) heart failure: Secondary | ICD-10-CM | POA: Diagnosis not present

## 2019-07-08 DIAGNOSIS — M25551 Pain in right hip: Secondary | ICD-10-CM | POA: Diagnosis present

## 2019-07-08 DIAGNOSIS — Z87891 Personal history of nicotine dependence: Secondary | ICD-10-CM | POA: Insufficient documentation

## 2019-07-08 DIAGNOSIS — I110 Hypertensive heart disease with heart failure: Secondary | ICD-10-CM | POA: Diagnosis not present

## 2019-07-08 MED ORDER — METHOCARBAMOL 500 MG PO TABS
500.0000 mg | ORAL_TABLET | Freq: Once | ORAL | Status: AC
  Administered 2019-07-08: 23:00:00 500 mg via ORAL
  Filled 2019-07-08: qty 1

## 2019-07-08 MED ORDER — LIDOCAINE 5 % EX PTCH
1.0000 | MEDICATED_PATCH | CUTANEOUS | Status: DC
  Administered 2019-07-08: 23:00:00 1 via TRANSDERMAL
  Filled 2019-07-08: qty 1

## 2019-07-08 MED ORDER — ONDANSETRON 4 MG PO TBDP
8.0000 mg | ORAL_TABLET | Freq: Once | ORAL | Status: AC
  Administered 2019-07-08: 8 mg via ORAL
  Filled 2019-07-08: qty 2

## 2019-07-08 MED ORDER — TRAMADOL HCL 50 MG PO TABS
50.0000 mg | ORAL_TABLET | Freq: Once | ORAL | Status: AC
  Administered 2019-07-08: 50 mg via ORAL
  Filled 2019-07-08: qty 1

## 2019-07-08 NOTE — ED Triage Notes (Signed)
Pt BIB [facility] from home, c/o right hip pain that radiates through her leg to her foot. Denies injury/fall.

## 2019-07-08 NOTE — ED Provider Notes (Signed)
[facility] EMERGENCY DEPARTMENT Provider Note   CSN: [id number] Arrival date & time: 07/08/19  2046     History Chief Complaint  Patient presents with  . Hip Pain    Amy [name] is a 59 y.o. female.   59 year old female with a history of asthma, IDDM, dyslipidemia, hypertension, CKD presents to the emergency department for evaluation of right hip pain.  She states that she has been experiencing pain over the past few days.  Has had similar discomfort in the past, but never "this bad".  She describes a throbbing pain which radiates down her lateral right leg to her knee and foot.  This is worse with twisting as well as ambulation or really any degree of movement.  She ambulates at baseline with a walker secondary to being a fall risk due to her blindness.  Lives in a home by herself.  Has been using extra strength Tylenol for pain without relief.  Denies use of chronic steroids, fevers, recent fall/trauma/injury, extremity numbness or paresthesias, bowel or bladder incontinence.  The history is provided by the patient. No language interpreter was used.  Hip Pain      Past Medical History:  Diagnosis Date  . Arthritis   . Asthma   . Blind   . Blindness and low vision    left eye glass eye,  legally blind in right eye  . Diabetes mellitus   . Diabetic neuropathy ([place])   . Hyperlipidemia   . Hypertension   . Hypothyroidism   . Spinal stenosis     Patient Active Problem List   Diagnosis Date Noted  . Pre-operative cardiovascular examination 02/18/2019  . Septic bursitis of elbow, right 10/30/2018  . CRI (chronic renal insufficiency), stage 3 (moderate) 07/26/2018  . Morbid obesity ([place]) 07/26/2018  . Intractable nausea and vomiting 03/06/2018  . Right leg pain 12/19/2017  . Leukocytosis 12/19/2017  . Benign positional vertigo 12/19/2017  . Weakness 12/12/2017  . Laryngopharyngeal reflux (LPR) 10/19/2017  . Post-nasal drainage 10/19/2017  . Acute  sinusitis 10/19/2017  . Degeneration of lumbar intervertebral disc 07/29/2017  . 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 Obstructive sleep apnea on CPAP 02/09/2017  . Chronic diastolic heart failure ([place]) 11/08/2016  . Near syncope 01/30/2016  . SOB (shortness of breath)   . CAP (community acquired pneumonia) 09/12/2015  . Hypoxia 09/12/2015  . Essential hypertension 07/05/2015  . Hypotension 07/05/2015  . Diabetes mellitus with neurological manifestations ([place]) 11/04/2014  . Hyperlipidemia LDL goal <70 11/04/2014  . Spinal stenosis, multilevel 11/04/2014  . Hyperkalemia 11/01/2014  . Hypoglycemia 08/09/2014  . Type 2 diabetes mellitus with complication, with long-term current use of insulin ([place]) 08/08/2014  . Elevated troponin 08/08/2014  . Nausea vomiting and diarrhea 08/08/2014  . 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 Dizziness 08/06/2012  . Orthostatic hypotension 08/06/2012  . Hypernatremia 08/06/2012  . Acute gastroenteritis 08/13/2011  . Gastroparesis 08/13/2011  . Low back pain 08/13/2011  . Oral thrush 08/13/2011  . Gastroenteritis 05/23/2011  . Dehydration 05/23/2011  . Blindness 05/23/2011  . DM type 1, not at goal, causing eye disease ([place]) 05/23/2011  . Asthma 05/23/2011  . Diarrhea 05/23/2011  . Vomiting 05/23/2011  . Hypothyroidism 05/23/2011  . Diabetic neuropathy ([place]) 05/23/2011  . Diabetic nephropathy ([place]) 05/23/2011    Past Surgical History:  Procedure Laterality Date  .  ABDOMINAL HYSTERECTOMY  1990  . [place]   x 2  . CHOLECYSTECTOMY  2008  . ENUCLEATION Bilateral 09/15/1998  . EYE SURGERY  2016   fitting artificial eye     OB History   No obstetric history on file.    Obstetric Comments  Pt has two sons.        Family History  Problem Relation Age of Onset  .  Diabetes Sister   . Glaucoma Sister   . Hypertension Sister   . Healthy Mother   . Healthy Father   . Stroke Brother   . Heart attack Paternal Aunt   . Breast cancer Neg Hx     Social History   Tobacco Use  . Smoking status: Former Smoker    Types: Cigarettes    Quit date: 05/22/1992    Years since quitting: 27.1  . Smokeless tobacco: Never Used  Substance Use Topics  . Alcohol use: No    Comment: quit 20 yrs ago  . Drug use: No    Home Medications Prior to Admission medications   Medication Sig Start Date End Date Taking? Authorizing Provider  albuterol (PROVENTIL HFA;VENTOLIN HFA) 108 (90 BASE) MCG/ACT inhaler Inhale 2 puffs into the lungs every 4 (four) hours as needed for wheezing or shortness of breath. For short    [provider]  ammonium lactate (AMLACTIN) 12 % lotion Apply 1 application topically as needed for dry skin. 06/14/19   Felipa Furnace, DPM  aspirin EC 81 MG tablet Take 81 mg by mouth daily.    [provider]  azithromycin (ZITHROMAX) 250 MG tablet  03/04/19   [provider]  B-D UF III MINI PEN NEEDLES 31G X 5 MM MISC U 1 PEN NEEDLE QID WITH EACH INJECTION 08/01/18   [provider]  cetirizine (ZYRTEC) 10 MG tablet Take 10 mg by mouth at bedtime.  05/31/18   [provider]  cyclobenzaprine (FLEXERIL) 10 MG tablet Take 1 tablet (10 mg total) by mouth 2 (two) times daily as needed for muscle spasms. 01/06/19   Hall-Potvin, [place], PA-C  esomeprazole (NEXIUM) 40 MG capsule Take 40 mg by mouth daily.     [provider]  fluticasone (FLONASE) 50 MCG/ACT nasal spray Place 1 spray into both nostrils daily.     [provider]  guaiFENesin (MUCINEX) 600 MG 12 hr tablet Take 1 tablet (600 mg total) by mouth 2 (two) times daily. 07/09/18   Lendon Colonel, NP  HUMALOG KWIKPEN 100 UNIT/ML KiwkPen Inject 8-14 Units into the skin See admin instructions. Inject 8 units in morning, 12 units at lunch, and 16  units at dinner 01/24/17   [provider]  ibuprofen (ADVIL) 200 MG tablet Take by mouth.    [provider]  levothyroxine (SYNTHROID, LEVOTHROID) 150 MCG tablet Take 150 mcg by mouth daily before breakfast.    [provider]  Linaclotide (LINZESS) 145 MCG CAPS capsule Take 145 mcg by mouth daily as needed (constipation).     [provider]  methocarbamol (ROBAXIN) 500 MG tablet Take 1 tablet (500 mg total) by mouth every 12 (twelve) hours as needed for muscle spasms. 07/09/19   Antonietta Breach, PA-C  metolazone (ZAROXOLYN) 2.5 MG tablet Take 1 tablet (2.5 mg total) by mouth daily. 07/26/18   Erlene Quan, PA-C  ondansetron (ZOFRAN ODT) 4 MG disintegrating tablet Take 1 tablet (4 mg total) by mouth every 8 (eight) hours as needed  for nausea or vomiting. 07/09/19   Antonietta Breach, PA-C  potassium chloride SA (K-DUR,KLOR-CON) 20 MEQ tablet TAKE 1 TABLET (20 MEQ TOTAL) BY MOUTH DAILY Patient taking differently: Take 20 mEq by mouth daily.  07/04/18   Bensimhon, Shaune Pascal, MD  predniSONE (DELTASONE) 20 MG tablet Take 1 tablet (20 mg total) by mouth 2 (two) times daily with a meal. 01/27/19   Raylene Everts, MD  pregabalin (LYRICA) 150 MG capsule Take 150 mg by mouth 3 (three) times daily.  01/01/18   [provider]  simvastatin (ZOCOR) 20 MG tablet Take 20 mg by mouth every evening.     [provider]  spironolactone (ALDACTONE) 25 MG tablet TAKE 1/2 TABLET BY MOUTH DAILY 01/30/19   Bensimhon, Shaune Pascal, MD  torsemide (DEMADEX) 20 MG tablet TAKE 4 TABLETS (80 MG TOTAL) BY MOUTH DAILY.  06/18/19   Clegg, Amy D, NP  traMADol (ULTRAM) 50 MG tablet Take 1 tablet (50 mg total) by mouth every 6 (six) hours as needed. 07/09/19   Antonietta Breach, PA-C  TRESIBA FLEXTOUCH 200 UNIT/ML SOPN Inject 60 Units into the skin at bedtime.  05/30/18   [provider]  triamcinolone cream (KENALOG) 0.1 % Apply 1 application topically daily as needed (irritation).  05/30/18    [provider]    Allergies    Penicillins, Vicodin [hydrocodone-acetaminophen], Codeine, Iodine-131, Iohexol, Lisinopril, and Sulfa antibiotics  Review of Systems   Review of Systems  Ten systems reviewed and are negative for acute change, except as noted in the HPI.    Physical Exam Updated Vital Signs BP (!) 111/58 (BP Location: Left Arm)   Pulse 83   Temp 98.6 F (37 C) (Oral)   Resp 20   SpO2 98%   Physical Exam Vitals and nursing note reviewed.  Constitutional:      General: She is not in acute distress.    Appearance: She is well-developed. She is not diaphoretic.     Comments: Obese AA female  HENT:     Head: Normocephalic and atraumatic.  Eyes:     General: No scleral icterus.    Conjunctiva/sclera: Conjunctivae normal.  Cardiovascular:     Rate and Rhythm: Normal rate and regular rhythm.     Pulses: Normal pulses.     Comments: DP pulse 2+ in the RLE Pulmonary:     Effort: Pulmonary effort is normal. No respiratory distress.     Comments: Respirations even and unlabored Musculoskeletal:        General: Normal range of motion.     Cervical back: Normal range of motion.     Comments: TTP to the R low back and upper right gluteal region. No bony deformities or step offs to the lumbosacral midline. No pitting edema or color change to the RLE. No leg shortening or malrotation. Positive straight leg raise on the right.  Skin:    General: Skin is warm and dry.     Coloration: Skin is not pale.     Findings: No erythema or rash.  Neurological:     General: No focal deficit present.     Mental Status: She is alert and oriented to person, place, and time.     Coordination: Coordination normal.     Comments: Sensation to light touch intact in BLE. Preserved strength against resistance with R hip flexion against resistance.  Psychiatric:        Behavior: Behavior normal.     ED Results / Procedures /  Treatments   Labs (all labs ordered are listed,  but only abnormal results are displayed) Labs Reviewed - No data to display  EKG None  Radiology No results found.  Procedures Procedures (including critical care time)  Medications Ordered in ED Medications  lidocaine (LIDODERM) 5 % 1 patch (1 patch Transdermal Patch Applied 07/08/19 2319)  methocarbamol (ROBAXIN) tablet 500 mg (500 mg Oral Given 07/08/19 2319)  ondansetron (ZOFRAN-ODT) disintegrating tablet 8 mg (8 mg Oral Given 07/08/19 2319)  traMADol (ULTRAM) tablet 50 mg (50 mg Oral Given 07/08/19 2319)    ED Course  I have reviewed the triage vital signs and the nursing notes.  Pertinent labs & imaging results that were available during my care of the patient were reviewed by me and considered in my medical decision making (see chart for details).  Clinical Course as of Jul 09 147  Tue Jul 09, 2019  0148 Patient reassessed.  She is resting comfortably.  She states that her pain is improved with medications.  She has ambulated in the emergency department with rolling walker which she utilizes at baseline.   [KH]    Clinical Course User Index [KH] Antonietta Breach, PA-C   MDM Rules/Calculators/A&P                      Patient with pain in the R hip radiating to the RLE.  Symptoms and exam c/w sciatica.  Reports hx of similar pain, but states it is worse today.  No recent or remote hx of trauma.  She is neurovascularly intact.  No red flags or signs concerning for cauda equina.  Ambulatory at baseline after pain control.  No fever, h/o cancer, IVDU.  Encouraged primary care follow-up for recheck.  Do not feel emergent imaging is presently indicated.  Return precautions discussed and provided. Patient discharged in stable condition with no unaddressed concerns.   Final Clinical Impression(s) / ED Diagnoses Final diagnoses:  Sciatica of right side    Rx / DC Orders ED Discharge Orders         Ordered    traMADol (ULTRAM) 50 MG tablet  Every 6 hours PRN     07/09/19 0146     methocarbamol (ROBAXIN) 500 MG tablet  Every 12 hours PRN     07/09/19 0147    ondansetron (ZOFRAN ODT) 4 MG disintegrating tablet  Every 8 hours PRN     07/09/19 0147           Antonietta Breach, PA-C 07/09/19 0151    Long, Wonda [place], MD 07/09/19 [id number]

## 2019-07-09 ENCOUNTER — Other Ambulatory Visit: Payer: Self-pay | Admitting: *Deleted

## 2019-07-09 MED ORDER — TRAMADOL HCL 50 MG PO TABS: 50.0000 mg | ORAL_TABLET | Freq: Four times a day (QID) | ORAL | 0 refills | Status: DC | PRN

## 2019-07-09 MED ORDER — ONDANSETRON 4 MG PO TBDP: 4.0000 mg | ORAL_TABLET | Freq: Three times a day (TID) | ORAL | 0 refills | Status: DC | PRN

## 2019-07-09 MED ORDER — METHOCARBAMOL 500 MG PO TABS: 500.0000 mg | ORAL_TABLET | Freq: Two times a day (BID) | ORAL | 0 refills | Status: DC | PRN

## 2019-07-09 NOTE — ED Notes (Signed)
Pt was able to ambulate to the bathroom w/ the use of a rolling walker.  Pt had to take several breaks but did well.

## 2019-07-09 NOTE — Discharge Instructions (Signed)
Alternate ice and heat to areas of injury 3-4 times per day to limit inflammation and spasm.  Avoid strenuous activity and heavy lifting.  We recommend consistent use of Robaxin for muscle spasms.  You have been prescribed tramadol to take as needed for severe pain.  Do not drive or drink alcohol after taking this medication as it may make you drowsy and impair your judgment.  You may use Zofran if this medication makes you drowsy.  We recommend follow-up with a primary care doctor to ensure resolution of symptoms.  Return to the ED for any new or concerning symptoms.

## 2019-07-09 NOTE — Patient Outreach (Signed)
[facility] ([facility]) Care Management  07/09/2019  Amy [name] 05/12/1961 [id number]   Noted that member visited the ED yesterday for sciatic pain.  Call placed to member, report she is not feeling well and does not want to stay on the phone long. She does confirm she was seen in the ED, state she was told she may have a pinched nerve but voices frustration stating there weren't any x-rays or scans done.  Advised that diagnosis may have been established through symptoms.  She was given pain medications (Percocet) in the ED but state she has been having nausea/vomiting since she arrived back home due to the medications.  She was prescribed Zofran, Methocarbamol, and Ultram but state she has not received it yet from pharmacy, will be delivered.  She has follow up appointment today with PCP.    Due to her nausea/vomiting, she has not checked her blood sugar today, but she was able to check her weight, decreased to 217 pounds.  She will attempt to check her blood sugar, eat and take her meds prior to MD visit.  Encouraged to drink to decrease risk of dehydration.  Denies any urgent concerns, will follow up within the next month.  [facility] CM Care Plan Problem One     Most Recent Value  Care Plan Problem One  Knowledge deficit regarding diabetes management as evidenced by elevated A1C  Role Documenting the Problem One  Care Management Coordinator  Care Plan for Problem One  Active  [facility] Long Term Goal   Member will report A1C decrease to </= 8 within the next 3 months  [facility]N Long Term Goal Start Date  05/29/19  Interventions for Problem One Long Term Goal  Reviewed management of diabetes with member, encouraged to continue to eat/drink and take meds despite pain/nausea.  Encouraged to drink fluids to decrease risk of dehydration  THN CM Short Term Goal #1   Member will report plan for increased support or decision for ALF within the next 2 weeks  [facility] CM Short Term Goal #1 Start Date  05/29/19   [facility] CM Short Term Goal #1 Met Date  07/09/19  [facility] CM Short Term Goal #2   Member will report checking blood sugars at least daily over the next 3 weeks  THN CM Short Term Goal #2 Start Date  05/29/19  [facility] CM Short Term Goal #2 Met Date  07/09/19    [facility] CM Care Plan Problem Two     Most Recent Value  Care Plan Problem Two  Risk for ED visit related to sciatic pain as evidenced by recent ED visit  Role Documenting the Problem Two  Care Management Coordinator  Care Plan for Problem Two  Active  [facility] CM Short Term Goal #1   Member will report decrease in pain level from 10 within the next 2 weeks  [facility] CM Short Term Goal #1 Start Date  07/09/19  Interventions for Short Term Goal #2   Reviewed ED AVS with member.  Reviewed pain medications as well as nausea meds, disucssed interventions for pain relief such as repositioning  [facility] CM Short Term Goal #2   Member will report keeping appointment with PCP within the next week  [facility] CM Short Term Goal #2 Start Date  07/09/19  Interventions for Short Term Goal #2  Reviewed appointment with member, assessed need for transportation.     Monica Lane, RN, MSN [facility] Care Management  Community Care Manager [phone]  

## 2019-07-11 ENCOUNTER — Encounter (HOSPITAL_COMMUNITY): Payer: Self-pay

## 2019-07-11 ENCOUNTER — Ambulatory Visit (HOSPITAL_COMMUNITY)
Admission: EM | Admit: 2019-07-11 | Discharge: 2019-07-11 | Disposition: A | Discharge status: Home / Self Care | Payer: Medicare Other | Attending: Family Medicine | Admitting: Family Medicine

## 2019-07-11 ENCOUNTER — Other Ambulatory Visit: Payer: Self-pay

## 2019-07-11 DIAGNOSIS — Z833 Family history of diabetes mellitus: Secondary | ICD-10-CM | POA: Insufficient documentation

## 2019-07-11 DIAGNOSIS — K219 Gastro-esophageal reflux disease without esophagitis: Secondary | ICD-10-CM | POA: Insufficient documentation

## 2019-07-11 DIAGNOSIS — M792 Neuralgia and neuritis, unspecified: Secondary | ICD-10-CM | POA: Insufficient documentation

## 2019-07-11 DIAGNOSIS — Z79899 Other long term (current) drug therapy: Secondary | ICD-10-CM | POA: Insufficient documentation

## 2019-07-11 DIAGNOSIS — E785 Hyperlipidemia, unspecified: Secondary | ICD-10-CM | POA: Diagnosis not present

## 2019-07-11 DIAGNOSIS — R200 Anesthesia of skin: Secondary | ICD-10-CM | POA: Insufficient documentation

## 2019-07-11 DIAGNOSIS — E039 Hypothyroidism, unspecified: Secondary | ICD-10-CM | POA: Insufficient documentation

## 2019-07-11 DIAGNOSIS — Z8249 Family history of ischemic heart disease and other diseases of the circulatory system: Secondary | ICD-10-CM | POA: Insufficient documentation

## 2019-07-11 DIAGNOSIS — Z794 Long term (current) use of insulin: Secondary | ICD-10-CM | POA: Diagnosis not present

## 2019-07-11 DIAGNOSIS — Z20822 Contact with and (suspected) exposure to covid-19: Secondary | ICD-10-CM | POA: Insufficient documentation

## 2019-07-11 DIAGNOSIS — Z79890 Hormone replacement therapy (postmenopausal): Secondary | ICD-10-CM | POA: Diagnosis not present

## 2019-07-11 DIAGNOSIS — G4733 Obstructive sleep apnea (adult) (pediatric): Secondary | ICD-10-CM | POA: Insufficient documentation

## 2019-07-11 DIAGNOSIS — N183 Chronic kidney disease, stage 3 unspecified: Secondary | ICD-10-CM | POA: Insufficient documentation

## 2019-07-11 DIAGNOSIS — Z7982 Long term (current) use of aspirin: Secondary | ICD-10-CM | POA: Diagnosis not present

## 2019-07-11 DIAGNOSIS — Z87891 Personal history of nicotine dependence: Secondary | ICD-10-CM | POA: Diagnosis not present

## 2019-07-11 DIAGNOSIS — E1122 Type 2 diabetes mellitus with diabetic chronic kidney disease: Secondary | ICD-10-CM | POA: Diagnosis not present

## 2019-07-11 DIAGNOSIS — R202 Paresthesia of skin: Secondary | ICD-10-CM | POA: Diagnosis present

## 2019-07-11 DIAGNOSIS — I130 Hypertensive heart and chronic kidney disease with heart failure and stage 1 through stage 4 chronic kidney disease, or unspecified chronic kidney disease: Secondary | ICD-10-CM | POA: Insufficient documentation

## 2019-07-11 DIAGNOSIS — E860 Dehydration: Secondary | ICD-10-CM | POA: Insufficient documentation

## 2019-07-11 DIAGNOSIS — I5032 Chronic diastolic (congestive) heart failure: Secondary | ICD-10-CM | POA: Diagnosis not present

## 2019-07-11 DIAGNOSIS — R112 Nausea with vomiting, unspecified: Secondary | ICD-10-CM | POA: Insufficient documentation

## 2019-07-11 DIAGNOSIS — J45909 Unspecified asthma, uncomplicated: Secondary | ICD-10-CM | POA: Diagnosis not present

## 2019-07-11 LAB — COMPREHENSIVE METABOLIC PANEL
ALT: 17 U/L (ref 0–44)
AST: 16 U/L (ref 15–41)
Albumin: 3.3 g/dL — ABNORMAL LOW (ref 3.5–5.0)
Alkaline Phosphatase: 101 U/L (ref 38–126)
Anion gap: 10 (ref 5–15)
BUN: 32 mg/dL — ABNORMAL HIGH (ref 6–20)
CO2: 30 mmol/L (ref 22–32)
Calcium: 9.8 mg/dL (ref 8.9–10.3)
Chloride: 108 mmol/L (ref 98–111)
Creatinine, Ser: 1.62 mg/dL — ABNORMAL HIGH (ref 0.44–1.00)
GFR calc Af Amer: 40 mL/min — ABNORMAL LOW (ref >60)
GFR calc non Af Amer: 34 mL/min — ABNORMAL LOW (ref >60)
Glucose, Bld: 254 mg/dL — ABNORMAL HIGH (ref 70–99)
Potassium: 4 mmol/L (ref 3.5–5.1)
Sodium: 148 mmol/L — ABNORMAL HIGH (ref 135–145)
Total Bilirubin: 0.6 mg/dL (ref 0.3–1.2)
Total Protein: 7.7 g/dL (ref 6.5–8.1)

## 2019-07-11 LAB — CBC WITH DIFFERENTIAL/PLATELET
Abs Immature Granulocytes: 0.11 10*3/uL — ABNORMAL HIGH (ref 0.00–0.07)
Basophils Absolute: 0 10*3/uL (ref 0.0–0.1)
Basophils Relative: 0 %
Eosinophils Absolute: 0.1 10*3/uL (ref 0.0–0.5)
Eosinophils Relative: 0 %
HCT: 40.9 % (ref 36.0–46.0)
Hemoglobin: 12.8 g/dL (ref 12.0–15.0)
Immature Granulocytes: 1 %
Lymphocytes Relative: 12 %
Lymphs Abs: 1.3 10*3/uL (ref 0.7–4.0)
MCH: 25.8 pg — ABNORMAL LOW (ref 26.0–34.0)
MCHC: 31.3 g/dL (ref 30.0–36.0)
MCV: 82.3 fL (ref 80.0–100.0)
Monocytes Absolute: 0.9 10*3/uL (ref 0.1–1.0)
Monocytes Relative: 7 %
Neutro Abs: 9.2 10*3/uL — ABNORMAL HIGH (ref 1.7–7.7)
Neutrophils Relative %: 80 %
Platelets: 198 10*3/uL (ref 150–400)
RBC: 4.97 MIL/uL (ref 3.87–5.11)
RDW: 15.8 % — ABNORMAL HIGH (ref 11.5–15.5)
WBC: 11.6 10*3/uL — ABNORMAL HIGH (ref 4.0–10.5)
nRBC: 0 % (ref 0.0–0.2)

## 2019-07-11 LAB — GLUCOSE, CAPILLARY: Glucose-Capillary: 236 mg/dL — ABNORMAL HIGH (ref 70–99)

## 2019-07-11 LAB — CBG MONITORING, ED: Glucose-Capillary: 236 mg/dL — ABNORMAL HIGH (ref 70–99)

## 2019-07-11 MED ORDER — PREDNISONE 10 MG PO TABS: 20.0000 mg | ORAL_TABLET | Freq: Every day | ORAL | 0 refills | Status: AC

## 2019-07-11 NOTE — ED Triage Notes (Signed)
Pt c/o SOB since Monday, n/v, and tingling to right hand and bilat legs/feet. States has lost 8 lbs since Monday 2/2 n/v. Unable to keep routine medications "down". Also reports dizziness while turning in bed.   States was seen in ER for right leg/hip pain and given narcotic pain relievers. States needs refill of albuterol inhaler.  Took partial dose of insulin (approx 40 units) last night 2/2 vomiting.Hasn't checked CBG in approx 4 days.

## 2019-07-11 NOTE — Discharge Instructions (Addendum)
Discontinue the tramadol. This seems to be making you very sick. We will do a low-dose of prednisone for the next couple of days.  Take this with food. I recommend taking the Robaxin as prescribed, as needed. Make sure you see Zofran as needed for nausea, vomiting and make sure you are sipping fluids and staying hydrated.

## 2019-07-12 NOTE — ED Provider Notes (Signed)
[place]    CSN: [id number] Arrival date & time: 07/11/19  1429      History   Chief Complaint Chief Complaint  Patient presents with  . Shortness of Breath  . Emesis    HPI Amy [name] is a 59 y.o. female.   Patient is a 59 year old female with past medical history of arthritis, asthma, blindness, diabetes, diabetic neuropathy, hyperlipidemia, hypertension, hypothyroidism.  She presents today with bilateral hand and feet tingling, numbness and pain N,V.  This has been constant and worsening since Monday.  She does have a history of neuropathy.  Dizziness when turning head side to side in bed.  She was seen in the ER 3 days ago.  Was treated for right sciatic nerve pain with muscle relaxers, tramadol and given Zofran for nausea, vomiting.  Reporting she cannot tolerate the narcotic pain medication due to giving her upset stomach.  Her pain is somewhat improved with that but she is still having discomfort. She has not really been able to hold any food or fluids down. She has been sipping gingerale. She has been also taking her insulin as prescribed. No able to hold down any of her other medications. No diarrhea, abdominal pain, fever. Mild SOB and ran out of her albuterol inhaler. Mild cough. No chest pain.   ROS per HPI    Shortness of Breath Associated symptoms: vomiting   Emesis   Past Medical History:  Diagnosis Date  . Arthritis   . Asthma   . Blind   . Blindness and low vision    left eye glass eye,  legally blind in right eye  . Diabetes mellitus   . Diabetic neuropathy ([place])   . Hyperlipidemia   . Hypertension   . Hypothyroidism   . Spinal stenosis     Patient Active Problem List   Diagnosis Date Noted  . Pre-operative cardiovascular examination 02/18/2019  . Septic bursitis of elbow, right 10/30/2018  . CRI (chronic renal insufficiency), stage 3 (moderate) 07/26/2018  . Morbid obesity ([place]) 07/26/2018  . Intractable nausea and vomiting  03/06/2018  . Right leg pain 12/19/2017  . Leukocytosis 12/19/2017  . Benign positional vertigo 12/19/2017  . Weakness 12/12/2017  . Laryngopharyngeal reflux (LPR) 10/19/2017  . Post-nasal drainage 10/19/2017  . Acute sinusitis 10/19/2017  . Degeneration of lumbar intervertebral disc 07/29/2017  . 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 Obstructive sleep apnea on CPAP 02/09/2017  . Chronic diastolic heart failure ([place]) 11/08/2016  . Near syncope 01/30/2016  . SOB (shortness of breath)   . CAP (community acquired pneumonia) 09/12/2015  . Hypoxia 09/12/2015  . Essential hypertension 07/05/2015  . Hypotension 07/05/2015  . Diabetes mellitus with neurological manifestations ([place]) 11/04/2014  . Hyperlipidemia LDL goal <70 11/04/2014  . Spinal stenosis, multilevel 11/04/2014  . Hyperkalemia 11/01/2014  . Hypoglycemia 08/09/2014  . Type 2 diabetes mellitus with complication, with long-term current use of insulin ([place]) 08/08/2014  . Elevated troponin 08/08/2014  . Nausea vomiting and diarrhea 08/08/2014  . 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 Dizziness 08/06/2012  . Orthostatic hypotension 08/06/2012  . Hypernatremia 08/06/2012  . Acute gastroenteritis 08/13/2011  . Gastroparesis 08/13/2011  . Low back pain 08/13/2011  . Oral thrush 08/13/2011  . Gastroenteritis 05/23/2011  . Dehydration 05/23/2011  . Blindness 05/23/2011  . DM type 1, not at goal, causing  eye disease ([place]) 05/23/2011  . Asthma 05/23/2011  . Diarrhea 05/23/2011  . Vomiting 05/23/2011  . Hypothyroidism 05/23/2011  . Diabetic neuropathy ([place]) 05/23/2011  . Diabetic nephropathy ([place]) 05/23/2011    Past Surgical History:  Procedure Laterality Date  . ABDOMINAL HYSTERECTOMY  1990  . [place]   x 2  . CHOLECYSTECTOMY  2008  . ENUCLEATION  Bilateral 09/15/1998  . EYE SURGERY  2016   fitting artificial eye    OB History   No obstetric history on file.    Obstetric Comments  Pt has two sons.         Home Medications    Prior to Admission medications   Medication Sig Start Date End Date Taking? Authorizing Provider  albuterol (PROVENTIL HFA;VENTOLIN HFA) 108 (90 BASE) MCG/ACT inhaler Inhale 2 puffs into the lungs every 4 (four) hours as needed for wheezing or shortness of breath. For short   Yes [provider]  aspirin EC 81 MG tablet Take 81 mg by mouth daily.   Yes [provider]  cetirizine (ZYRTEC) 10 MG tablet Take 10 mg by mouth at bedtime.  05/31/18  Yes [provider]  esomeprazole (NEXIUM) 40 MG capsule Take 40 mg by mouth daily.    Yes [provider]  levothyroxine (SYNTHROID, LEVOTHROID) 150 MCG tablet Take 150 mcg by mouth daily before breakfast.   Yes [provider]  ondansetron (ZOFRAN ODT) 4 MG disintegrating tablet Take 1 tablet (4 mg total) by mouth every 8 (eight) hours as needed for nausea or vomiting. 07/09/19  Yes Antonietta Breach, PA-C  pregabalin (LYRICA) 150 MG capsule Take 150 mg by mouth 3 (three) times daily.  01/01/18  Yes [provider]  ammonium lactate (AMLACTIN) 12 % lotion Apply 1 application topically as needed for dry skin. 06/14/19   Felipa Furnace, DPM  azithromycin (ZITHROMAX) 250 MG tablet  03/04/19   [provider]  B-D UF III MINI PEN NEEDLES 31G X 5 MM MISC U 1 PEN NEEDLE QID WITH [facility] INJECTION 08/01/18   [provider]  fluticasone (FLONASE) 50 MCG/ACT nasal spray Place 1 spray into both nostrils daily.     [provider]  HUMALOG KWIKPEN 100 UNIT/ML KiwkPen Inject 8-14 Units into the skin See admin instructions. Inject 8 units in morning, 12 units at lunch, and 16 units at dinner 01/24/17   [provider]  ibuprofen (ADVIL) 200 MG tablet Take by mouth.    [provider]    Linaclotide Rolan Lipa) 145 MCG CAPS capsule Take 145 mcg by mouth daily as needed (constipation).     [provider]  methocarbamol (ROBAXIN) 500 MG tablet Take 1 tablet (500 mg total) by mouth every 12 (twelve) hours as needed for muscle spasms. 07/09/19   Antonietta Breach, PA-C  metolazone (ZAROXOLYN) 2.5 MG tablet Take 1 tablet (2.5 mg total) by mouth daily. 07/26/18   Erlene Quan, PA-C  potassium chloride SA (K-DUR,KLOR-CON) 20 MEQ tablet TAKE 1 TABLET (20 MEQ TOTAL) BY MOUTH DAILY Patient taking differently: Take 20 mEq by mouth daily.  07/04/18   Bensimhon, Shaune Pascal, MD  predniSONE (DELTASONE) 10 MG tablet Take 2 tablets (20 mg total) by mouth daily for 5 days. 07/11/19 07/16/19  Loura Halt A, NP  simvastatin (ZOCOR) 20 MG tablet Take 20 mg by mouth every evening.     [provider]  spironolactone (ALDACTONE) 25 MG tablet TAKE 1/2 TABLET BY MOUTH DAILY 01/30/19  Bensimhon, Shaune Pascal, MD  torsemide (DEMADEX) 20 MG tablet TAKE 4 TABLETS (80 MG TOTAL) BY MOUTH DAILY.  06/18/19   Clegg, Amy D, NP  TRESIBA FLEXTOUCH 200 UNIT/ML SOPN Inject 60 Units into the skin at bedtime.  05/30/18   [provider]  triamcinolone cream (KENALOG) 0.1 % Apply 1 application topically daily as needed (irritation).  05/30/18   [provider]    Family History Family History  Problem Relation Age of Onset  . Diabetes Sister   . Glaucoma Sister   . Hypertension Sister   . Healthy Mother   . Healthy Father   . Stroke Brother   . Heart attack Paternal Aunt   . Breast cancer Neg Hx     Social History Social History   Tobacco Use  . Smoking status: Former Smoker    Types: Cigarettes    Quit date: 05/22/1992    Years since quitting: 27.1  . Smokeless tobacco: Never Used  Substance Use Topics  . Alcohol use: No    Comment: quit 20 yrs ago  . Drug use: No     Allergies   Penicillins, Vicodin [hydrocodone-acetaminophen], Codeine, Iodine-131, Iohexol, Lisinopril, and Sulfa  antibiotics   Review of Systems Review of Systems  Respiratory: Positive for shortness of breath.   Gastrointestinal: Positive for vomiting.     Physical Exam Triage Vital Signs ED Triage Vitals  Enc Vitals Group     BP 07/11/19 1517 (!) 157/75     Pulse Rate 07/11/19 1517 81     Resp 07/11/19 1517 (!) 28     Temp 07/11/19 1517 98.4 F (36.9 C)     Temp Source 07/11/19 1517 Oral     SpO2 07/11/19 1517 99 %     Weight --      Height --      Head Circumference --      Peak Flow --      Pain Score 07/11/19 1519 8     Pain Loc --      Pain Edu? --      Excl. in [place]? --    No data found.  Updated Vital Signs BP (!) 157/75 (BP Location: Left Arm)   Pulse 81   Temp 98.4 F (36.9 C) (Oral)   Resp (!) 28   SpO2 99%   Visual Acuity Right Eye Distance:   Left Eye Distance:   Bilateral Distance:    Right Eye Near:   Left Eye Near:    Bilateral Near:     Physical Exam Vitals and nursing note reviewed.  Constitutional:      General: She is not in acute distress.    Appearance: She is not ill-appearing, toxic-appearing or diaphoretic.     Comments: Pt wearing glasses due to blindness.  Walks with cane   HENT:     Head: Normocephalic and atraumatic.  Cardiovascular:     Rate and Rhythm: Normal rate and regular rhythm.  Pulmonary:     Effort: Pulmonary effort is normal.     Breath sounds: Normal breath sounds.     Comments: Speaking in full sentences without difficulty.  No dyspnea or distress.  Musculoskeletal:     Cervical back: Normal range of motion.     Right lower leg: No tenderness. No edema.     Left lower leg: No tenderness. No edema.  Skin:    General: Skin is warm and dry.  Neurological:     General: No focal deficit  present.     Mental Status: She is alert.  Psychiatric:        Mood and Affect: Mood normal.      UC Treatments / Results  Labs (all labs ordered are listed, but only abnormal results are displayed) Labs Reviewed  GLUCOSE,  CAPILLARY - Abnormal; Notable for the following components:      Result Value   Glucose-Capillary 236 (*)    All other components within normal limits  CBC WITH DIFFERENTIAL/PLATELET - Abnormal; Notable for the following components:   WBC 11.6 (*)    MCH 25.8 (*)    RDW 15.8 (*)    Neutro Abs 9.2 (*)    Abs Immature Granulocytes 0.11 (*)    All other components within normal limits  COMPREHENSIVE METABOLIC PANEL - Abnormal; Notable for the following components:   Sodium 148 (*)    Glucose, Bld 254 (*)    BUN 32 (*)    Creatinine, Ser 1.62 (*)    Albumin 3.3 (*)    GFR calc non Af Amer 34 (*)    GFR calc Af Amer 40 (*)    All other components within normal limits  CBG MONITORING, ED - Abnormal; Notable for the following components:   Glucose-Capillary 236 (*)    All other components within normal limits  NOVEL CORONAVIRUS, NAA (HOSP ORDER, SEND-OUT TO REF LAB; TAT 18-24 HRS)    EKG   Radiology No results found.  Procedures Procedures (including critical care time)  Medications Ordered in UC Medications - No data to display  Initial Impression / Assessment and Plan / UC Course  I have reviewed the triage vital signs and the nursing notes.  Pertinent labs & imaging results that were available during my care of the patient were reviewed by me and considered in my medical decision making (see chart for details).     Dehydration-patient has been having nausea, vomiting for the last couple of  days.  She has been able to sip some fluids.  Taking Zofran for nausea, vomiting.  Most likely the vomiting is from taking the pain medication which she typically does not tolerate well.  This is when the vomiting started. Recommended stop taking the tramadol.  We will switch to low-dose prednisone for sciatic nerve pain. Recommended continue the Robaxin as needed She is not having any abdominal pain.  Her vital signs are stable and she is nontoxic or ill-appearing. Most likely the  dehydration is also causing her abnormal sensations in hands and feet and dizziness. Patient also has a history of neuropathy and takes Lyrica for this.  She has not been able to take her daily medications due to the vomiting.  Recommended follow-up in the ER for any continued or worsening symptoms.  Blood work mostly unremarkable.  Patient has history of leukocytosis and elevated sodium. Kidney function is mostly unchanged.      Final Clinical Impressions(s) / UC Diagnoses   Final diagnoses:  Dehydration  Nausea and vomiting, intractability of vomiting not specified, unspecified vomiting type  Nerve pain     Discharge Instructions     Discontinue the tramadol. This seems to be making you very sick. We will do a low-dose of prednisone for the next couple of days.  Take this with food. I recommend taking the Robaxin as prescribed, as needed. Make sure you see Zofran as needed for nausea, vomiting and make sure you are sipping fluids and staying hydrated.    ED Prescriptions  Medication Sig Dispense Auth. Provider   predniSONE (DELTASONE) 10 MG tablet Take 2 tablets (20 mg total) by mouth daily for 5 days. 10 tablet Loura Halt A, NP     PDMP not reviewed this encounter.   Orvan July, NP 07/12/19 1433

## 2019-07-13 LAB — NOVEL CORONAVIRUS, NAA (HOSP ORDER, SEND-OUT TO REF LAB; TAT 18-24 HRS): SARS-CoV-2, NAA: NOT DETECTED

## 2019-07-15 ENCOUNTER — Encounter (HOSPITAL_COMMUNITY): Payer: Medicare Other

## 2019-07-29 ENCOUNTER — Encounter: Payer: Self-pay | Admitting: Family Medicine

## 2019-08-01 ENCOUNTER — Ambulatory Visit (INDEPENDENT_AMBULATORY_CARE_PROVIDER_SITE_OTHER): Payer: Medicare Other

## 2019-08-01 ENCOUNTER — Ambulatory Visit (INDEPENDENT_AMBULATORY_CARE_PROVIDER_SITE_OTHER): Payer: Medicare Other | Admitting: Podiatry

## 2019-08-01 ENCOUNTER — Other Ambulatory Visit: Payer: Self-pay

## 2019-08-01 DIAGNOSIS — M778 Other enthesopathies, not elsewhere classified: Secondary | ICD-10-CM

## 2019-08-01 DIAGNOSIS — L97511 Non-pressure chronic ulcer of other part of right foot limited to breakdown of skin: Secondary | ICD-10-CM

## 2019-08-01 DIAGNOSIS — E1140 Type 2 diabetes mellitus with diabetic neuropathy, unspecified: Secondary | ICD-10-CM

## 2019-08-01 DIAGNOSIS — S93692A Other sprain of left foot, initial encounter: Secondary | ICD-10-CM

## 2019-08-01 NOTE — Progress Notes (Signed)
This is a patient of Dr. Posey Pronto she presents today with no apparent trauma to the left foot but states that she has had a fair amount of swelling of the foot and has severe pain on the plantar aspect of the foot left.  States that the ulceration to the right heel has gone on to heal and she has no problems with that.  Objective: Vital signs are stable she is alert and oriented x3.  Pulses are palpable.  She has dry callus tissue to the posterior inferior aspect of the right heel where the previous ulcer was.  I debrided that today does not appear to be infected.  Contralateral foot does demonstrate pain on palpation of the plantar fascia and appears to have a void in the plantar fascia radiographically it demonstrates and in continuity of the plantar fascia.  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  Plan: At this point I recommended a cam walker and compression dressing.  I will follow-up with her in a couple of weeks.

## 2019-08-03 IMAGING — MR MR LUMBAR SPINE W/O CM
4 of 5 series · 22 of 48 positions shown · non-contrast
Comparison: 11/07/2015

CLINICAL DATA: Lower back pain radiating into right hip and right
leg, numbness in right leg, symptoms are intermittent and started 1
year ago, no injury, no injections or back surgery.

EXAM:
MRI LUMBAR SPINE WITHOUT CONTRAST
TECHNIQUE: Multiplanar, multisequence MR imaging of the lumbar spine was
performed. No intravenous contrast was administered.

[Series 3: T2 · sagittal · 4.0mm · 0.73mm/px · 6 of 16 slices shown (1 of 2)]
[im 1/16]
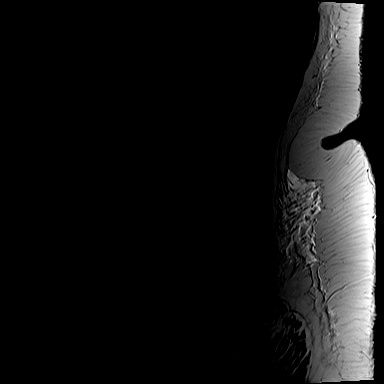
[im 4/16]
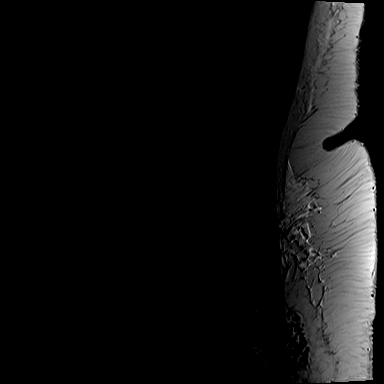
[im 7/16]
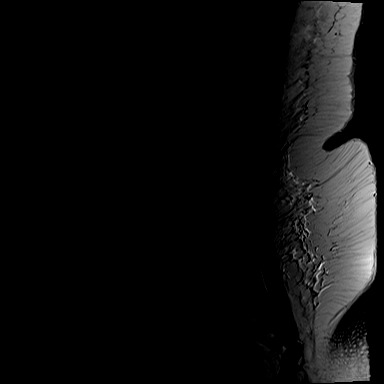
[im 10/16]
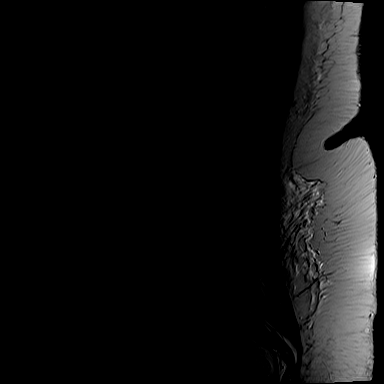
[im 13/16]
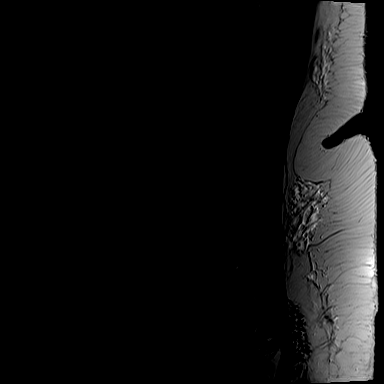
[im 16/16]
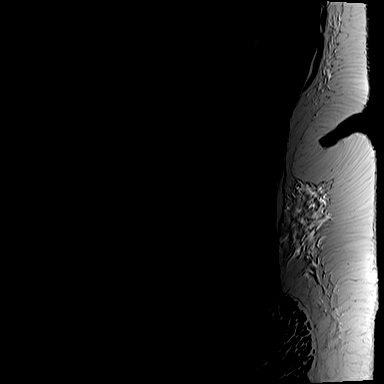

[Series 5: T1 · sagittal · 4.0mm · 0.73mm/px · 4 of 16 slices shown (1 of 2)]
[im 1/16]
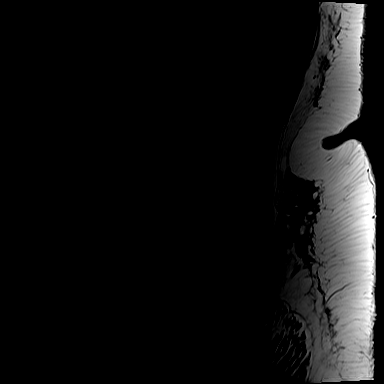
[im 4/16]
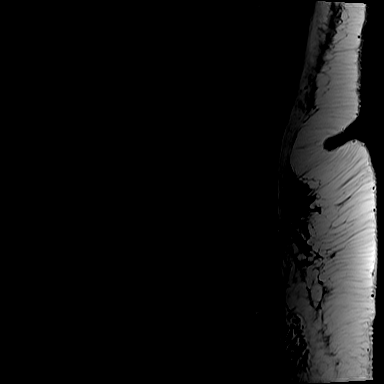
[im 10/16]
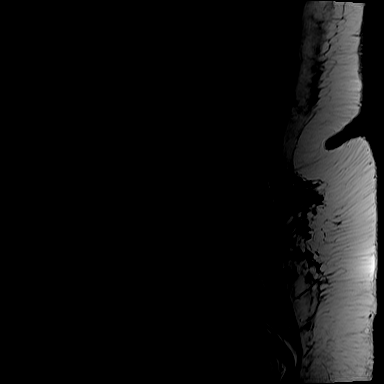
[im 16/16]
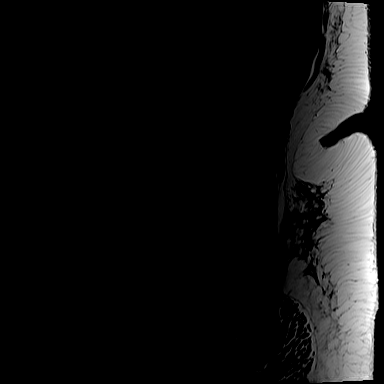

[Series 6: T2 · axial · 4.0mm · 0.39mm/px · z∈[+35,+246]mm · 9 of 42 slices shown (2 of 2)]
[im 1/42]
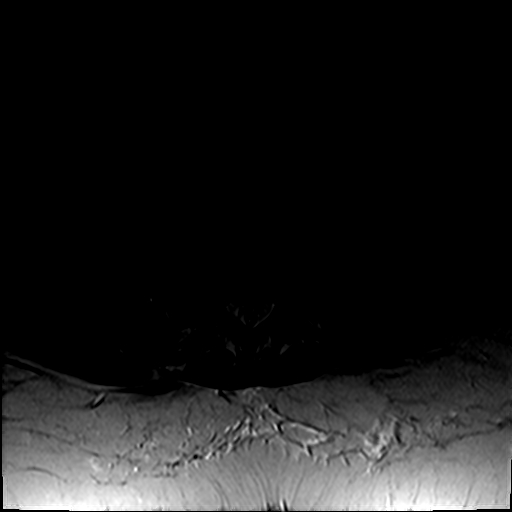
[im 6/42]
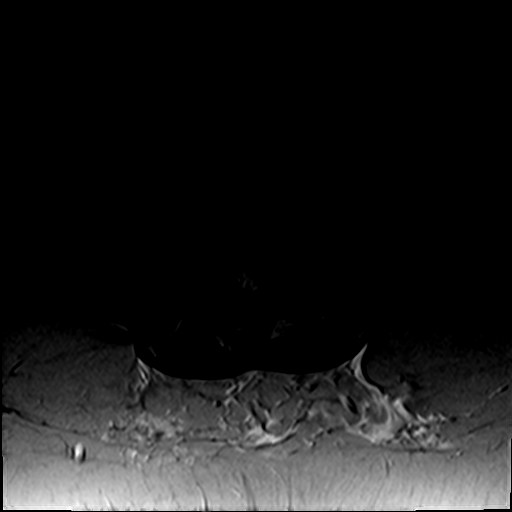
[im 12/42]
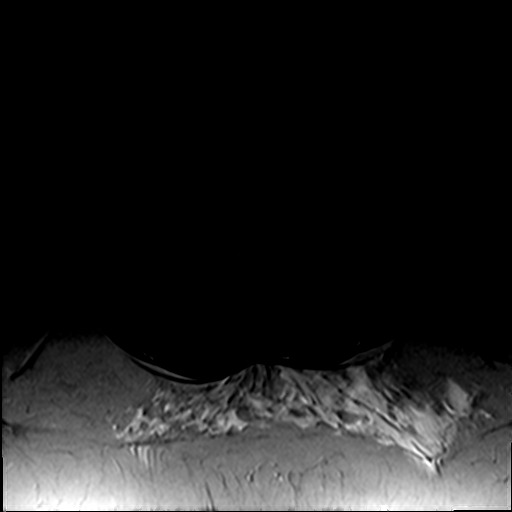
[im 18/42]
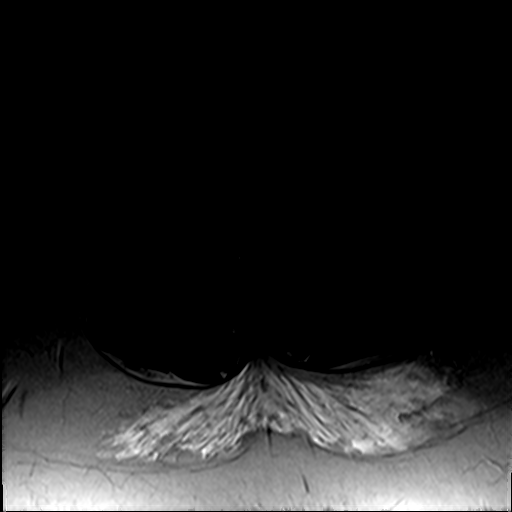
[im 21/42]
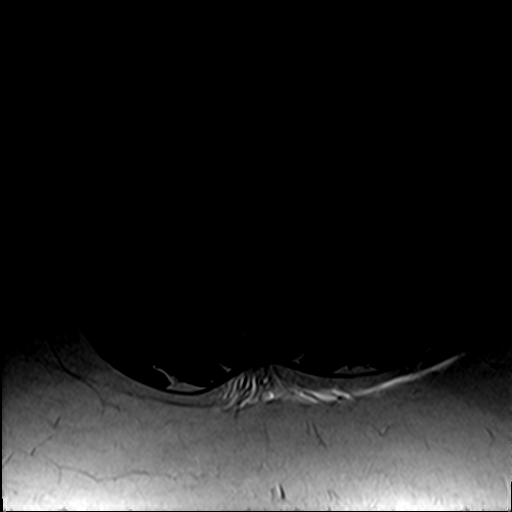
[im 24/42]
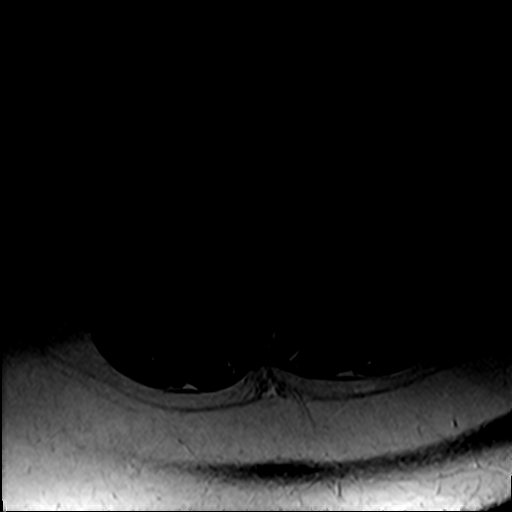
[im 30/42]
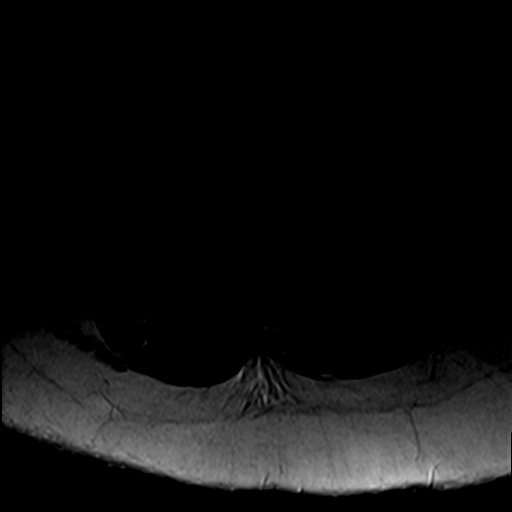
[im 36/42]
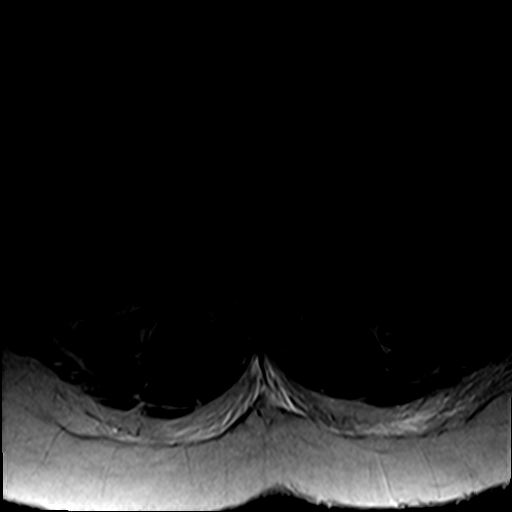
[im 42/42]
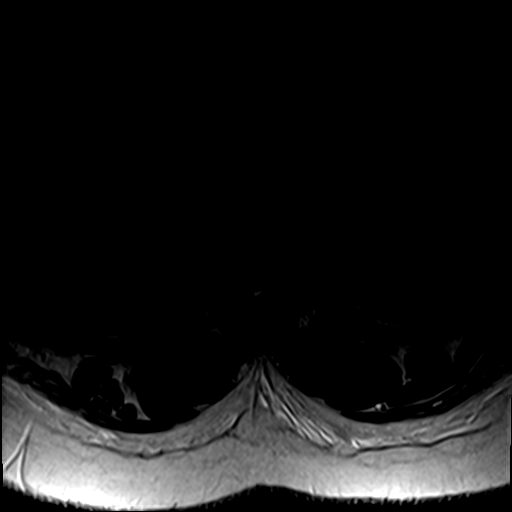

[Series 7: T1 · axial · 4.0mm · 0.31mm/px · z∈[+59,+218]mm · 3 of 42 slices shown (2 of 2)]
[im 6/42]
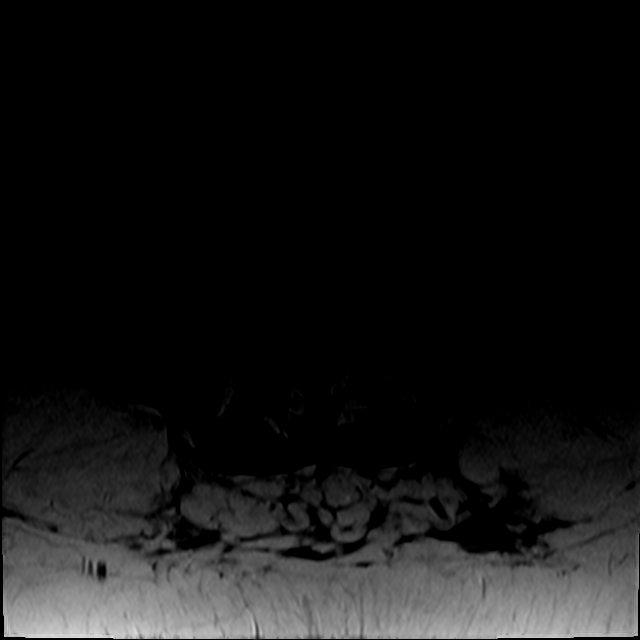
[im 21/42]
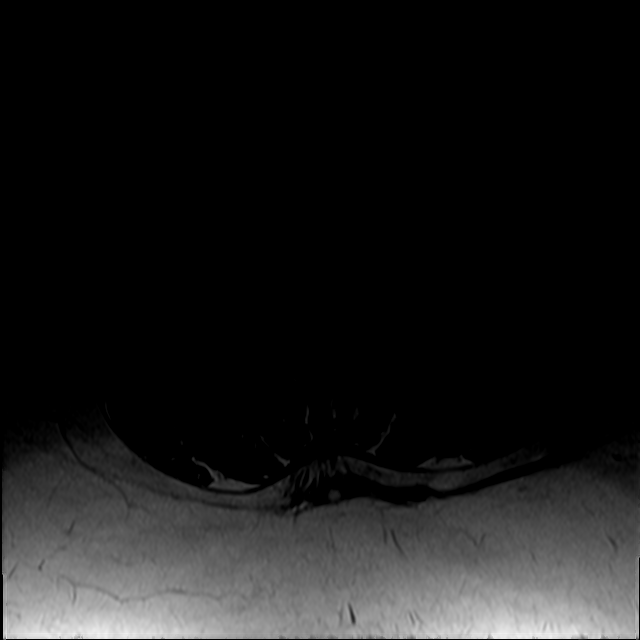
[im 36/42]
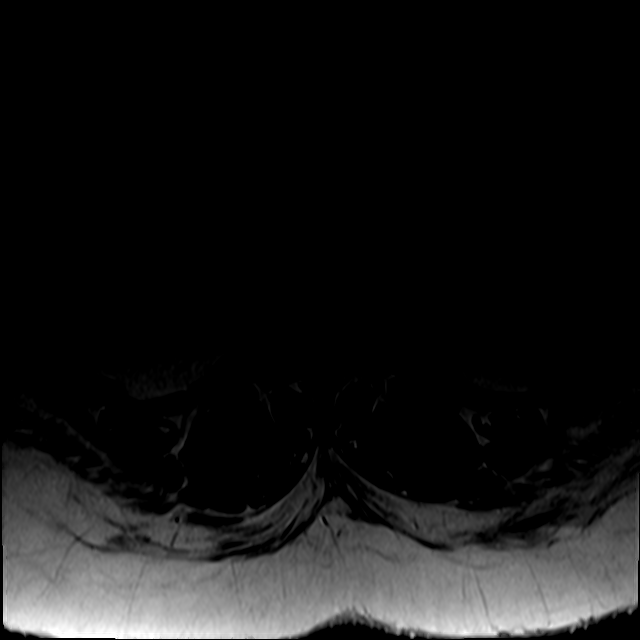

[22 of 48 positions shown; findings below may reference images not displayed]

FINDINGS: Segmentation:  Standard.

Alignment:  Physiologic.

Vertebrae:  No fracture, evidence of discitis, or bone lesion.

Conus medullaris and cauda equina: Conus extends to the L1-2 level.
Conus and cauda equina appear normal.

Paraspinal and other soft tissues: No acute paraspinal abnormality.

Disc levels:

Disc spaces: Disc spaces are maintained.

T12-L1: No significant disc bulge. No evidence of neural foraminal
stenosis. No central canal stenosis.

L1-L2: No significant disc bulge. No evidence of neural foraminal
stenosis. No central canal stenosis.

L2-L3: No significant disc bulge. No evidence of neural foraminal
stenosis. No central canal stenosis.

L3-L4: No significant disc bulge. No evidence of neural foraminal
stenosis. No central canal stenosis. Mild bilateral facet
arthropathy.

L4-L5: No significant disc bulge. No evidence of neural foraminal
stenosis. No central canal stenosis. Mild bilateral facet
arthropathy.

L5-S1: Mild broad-based disc bulge. Mild bilateral facet
arthropathy, left greater than right. Mild bilateral foraminal
narrowing. No central canal stenosis.
IMPRESSION: 1. At L5-S1 there is a mild broad-based disc bulge. Mild bilateral
facet arthropathy, left greater than right. Mild bilateral foraminal
narrowing.

## 2019-08-07 ENCOUNTER — Other Ambulatory Visit: Payer: Self-pay | Admitting: *Deleted

## 2019-08-07 NOTE — Patient Outreach (Signed)
[place] [facility]) Care Management  08/07/2019  Amy [name] 17-Jun-1960 [id number]   Call placed to member to follow up on management of chronic medical conditions.  Report she is still having some intermittent sciatic pain, but better than previous outreach.  State she is now having discomfort in her left foot and unable to put full weight on it, was seen by podiatrist last week.  Noted per chart that member has rupture of the plantar fascia, which nothing can be done about according to member.  Her foot was wrapped in ace bandage and a boot was placed on it.  She report she has since taken both off stating it was causing discomfort and swelling in her leg.  She will attempt to re-wrap today, has follow up scheduled for 4/9.    She expresses frustration regarding having to get her hospital bed picked up because she couldn't afford to pay for both the bed and the mattress.  State she is now sleeping on the couch, which is uncomfortable.  She is open to being contacted by Education officer, museum to discuss DME options.    Report her blood sugars have been well managed, most recent A1C decreased to 8.8.  Has eye appointment scheduled for 3/26, unsure when her PCP visit is.  She will call to follow up and schedule.  She will have her second covid shot on 4/5, denies any complications from the first dose.  Denies any urgent concerns at this time, will follow up within the next month.  THN CM Care Plan Problem One     Most Recent Value  Care Plan Problem One  Knowledge deficit regarding diabetes management as evidenced by elevated A1C  Role Documenting the Problem One  Care Management [place] for Problem One  Active  THN Long Term Goal   Member will report A1C decrease to </= 8 within the next 3 months  THN Long Term Goal Start Date  05/29/19  [facility] Long Term Goal Met Date  08/07/19  [facility] CM Short Term Goal #1   Member will report plan for new hospital bed within the next 3 weeks   THN CM Short Term Goal #1 Start Date  08/07/19  [facility] for Short Term Goal #1  Referral placed to social worker for DME    [facility] CM Care Plan Problem Two     Most Recent Value  Care Plan Problem Two  Risk for ED visit related to sciatic pain as evidenced by recent ED visit  Role Documenting the Problem Two  Care Management [place] for Problem Two  Active  THN CM Short Term Goal #1   Member will report decrease in pain level from 10 within the next 2 weeks  THN CM Short Term Goal #1 Start Date  07/09/19  [facility] CM Short Term Goal #1 Met Date   08/07/19  [facility] CM Short Term Goal #2   Member will report keeping appointment with PCP within the next week  [facility] CM Short Term Goal #2 Start Date  07/09/19  [facility] CM Short Term Goal #2 Met Date  08/07/19     Valente David, RN, MSN [place] [phone]

## 2019-08-09 ENCOUNTER — Other Ambulatory Visit (HOSPITAL_COMMUNITY): Payer: Self-pay | Admitting: Internal Medicine

## 2019-08-23 ENCOUNTER — Ambulatory Visit: Payer: Medicare Other | Admitting: Podiatry

## 2019-09-06 ENCOUNTER — Other Ambulatory Visit: Payer: Self-pay | Admitting: *Deleted

## 2019-09-06 NOTE — Patient Outreach (Signed)
[place] [facility]) Care Management  09/06/2019  JENIPHER HAVEL 1961-01-15 [id number]   Call placed to member to follow up on management of chronic medical conditions.  She state this is not a good time to talk as she is on a call with her support group for the blind.  Will attempt to call back later today, if unable will follow up within the next 2-3 business days.  Valente David, [place], MSN [place] [phone]

## 2019-09-10 ENCOUNTER — Other Ambulatory Visit: Payer: Self-pay | Admitting: *Deleted

## 2019-09-10 NOTE — Patient Outreach (Signed)
[name] Care Management  09/10/2019  Amy [name] July 09, 1960 [id number]   Call placed to member to follow up on management of diabetes. She report she has been doing "real good."  Denies any complications with diabetes management, state her blood sugars have been "fine." She does not elaborate on specific numbers but denies any hypo-hyperglycemic episodes.  She has follow up appointment scheduled for PCP next month.  Discussed having ongoing involvement for disease management, she agrees.    Will place referral to health coach and notify PCP of transition.  THN CM Care Plan Problem One     Most Recent Value  Care Plan Problem One  Knowledge deficit regarding diabetes management as evidenced by elevated A1C  Role Documenting the Problem One  Care Management [place] for Problem One  Active  THN Long Term Goal   Member will report A1C decrease to </= 8 within the next 3 months  THN Long Term Goal Start Date  05/29/19  [facility] Long Term Goal Met Date  08/07/19  [facility] CM Short Term Goal #1   Member will report plan for new hospital bed within the next 3 weeks  THN CM Short Term Goal #1 Start Date  08/07/19  [facility] CM Short Term Goal #1 Met Date  09/10/19    [facility] CM Care Plan Problem Two     Most Recent Value  Care Plan Problem Two  Risk for ED visit related to sciatic pain as evidenced by recent ED visit  Role Documenting the Problem Two  Care Management [place] for Problem Two  Active  THN CM Short Term Goal #1   Member will report decrease in pain level from 10 within the next 2 weeks  THN CM Short Term Goal #1 Start Date  07/09/19  [facility] CM Short Term Goal #1 Met Date   08/07/19  [facility] CM Short Term Goal #2   Member will report keeping appointment with PCP within the next week  [facility] CM Short Term Goal #2 Start Date  07/09/19  [facility] CM Short Term Goal #2 Met Date  08/07/19     Valente David, RN, MSN [place] [phone]

## 2019-09-11 ENCOUNTER — Other Ambulatory Visit: Payer: Self-pay | Admitting: *Deleted

## 2019-09-23 ENCOUNTER — Ambulatory Visit: Payer: Medicare Other | Admitting: Podiatry

## 2019-09-26 ENCOUNTER — Other Ambulatory Visit: Payer: Self-pay

## 2019-09-26 ENCOUNTER — Ambulatory Visit (INDEPENDENT_AMBULATORY_CARE_PROVIDER_SITE_OTHER): Payer: Medicare Other | Admitting: Podiatry

## 2019-09-26 ENCOUNTER — Encounter: Payer: Self-pay | Admitting: Podiatry

## 2019-09-26 VITALS — Temp 96.4°F

## 2019-09-26 DIAGNOSIS — E1142 Type 2 diabetes mellitus with diabetic polyneuropathy: Secondary | ICD-10-CM

## 2019-09-26 DIAGNOSIS — B351 Tinea unguium: Secondary | ICD-10-CM | POA: Diagnosis not present

## 2019-09-26 DIAGNOSIS — M79676 Pain in unspecified toe(s): Secondary | ICD-10-CM | POA: Diagnosis not present

## 2019-09-26 DIAGNOSIS — L84 Corns and callosities: Secondary | ICD-10-CM

## 2019-09-26 DIAGNOSIS — L97319 Non-pressure chronic ulcer of right ankle with unspecified severity: Secondary | ICD-10-CM

## 2019-09-26 NOTE — Patient Instructions (Addendum)
Recommend heel pillows/proctectors for both feet when sleeping   Diabetes Mellitus and Foot Care Foot care is an important part of your health, especially when you have diabetes. Diabetes may cause you to have problems because of poor blood flow (circulation) to your feet and legs, which can cause your skin to:  Become thinner and drier.  Break more easily.  Heal more slowly.  Peel and crack. You may also have nerve damage (neuropathy) in your legs and feet, causing decreased feeling in them. This means that you may not notice minor injuries to your feet that could lead to more serious problems. Noticing and addressing any potential problems early is the best way to prevent future foot problems. How to care for your feet Foot hygiene  Wash your feet daily with warm water and mild soap. Do not use hot water. Then, pat your feet and the areas between your toes until they are completely dry. Do not soak your feet as this can dry your skin.  Trim your toenails straight across. Do not dig under them or around the cuticle. File the edges of your nails with an emery board or nail file.  Apply a moisturizing lotion or petroleum jelly to the skin on your feet and to dry, brittle toenails. Use lotion that does not contain alcohol and is unscented. Do not apply lotion between your toes. Shoes and socks  Wear clean socks or stockings every day. Make sure they are not too tight. Do not wear knee-high stockings since they may decrease blood flow to your legs.  Wear shoes that fit properly and have enough cushioning. Always look in your shoes before you put them on to be sure there are no objects inside.  To break in new shoes, wear them for just a few hours a day. This prevents injuries on your feet. Wounds, scrapes, corns, and calluses  Check your feet daily for blisters, cuts, bruises, sores, and redness. If you cannot see the bottom of your feet, use a mirror or ask someone for help.  Do not  cut corns or calluses or try to remove them with medicine.  If you find a minor scrape, cut, or break in the skin on your feet, keep it and the skin around it clean and dry. You may clean these areas with mild soap and water. Do not clean the area with peroxide, alcohol, or iodine.  If you have a wound, scrape, corn, or callus on your foot, look at it several times a day to make sure it is healing and not infected. Check for: ? Redness, swelling, or pain. ? Fluid or blood. ? Warmth. ? Pus or a bad smell. General instructions  Do not cross your legs. This may decrease blood flow to your feet.  Do not use heating pads or hot water bottles on your feet. They may burn your skin. If you have lost feeling in your feet or legs, you may not know this is happening until it is too late.  Protect your feet from hot and cold by wearing shoes, such as at the beach or on hot pavement.  Schedule a complete foot exam at least once a year (annually) or more often if you have foot problems. If you have foot problems, report any cuts, sores, or bruises to your health care provider immediately. Contact a health care provider if:  You have a medical condition that increases your risk of infection and you have any cuts, sores, or bruises on  your feet.  You have an injury that is not healing.  You have redness on your legs or feet.  You feel burning or tingling in your legs or feet.  You have pain or cramps in your legs and feet.  Your legs or feet are numb.  Your feet always feel cold.  You have pain around a toenail. Get help right away if:  You have a wound, scrape, corn, or callus on your foot and: ? You have pain, swelling, or redness that gets worse. ? You have fluid or blood coming from the wound, scrape, corn, or callus. ? Your wound, scrape, corn, or callus feels warm to the touch. ? You have pus or a bad smell coming from the wound, scrape, corn, or callus. ? You have a fever. ? You  have a red line going up your leg. Summary  Check your feet every day for cuts, sores, red spots, swelling, and blisters.  Moisturize feet and legs daily.  Wear shoes that fit properly and have enough cushioning.  If you have foot problems, report any cuts, sores, or bruises to your health care provider immediately.  Schedule a complete foot exam at least once a year (annually) or more often if you have foot problems. This information is not intended to replace advice given to you by your health care provider. Make sure you discuss any questions you have with your health care provider. Document Revised: 01/23/2019 Document Reviewed: 06/03/2016 Elsevier Patient Education  [place].

## 2019-10-01 ENCOUNTER — Encounter: Payer: Self-pay | Admitting: *Deleted

## 2019-10-01 ENCOUNTER — Other Ambulatory Visit: Payer: Self-pay | Admitting: *Deleted

## 2019-10-02 ENCOUNTER — Encounter: Payer: Self-pay | Admitting: *Deleted

## 2019-10-02 NOTE — Patient Outreach (Addendum)
[name]) Care Management  [place]  10/02/2019   Amy [name] 1961-02-05 [id number]   Successful telephone outreach call to patient. HIPAA identifiers obtained. Social: Patient lives alone. 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 She has social services transportation that transports her to her medical appointments. Patient denies having any recent falls and reports that her home environment is safe. Her sister Amy [name] would assist the patient if needed but the assistance per patient would be limited. Patient discussed that she does not feel that she is depressed but she does get emotional when she reflects back to her childhood of being in foster care and she feels hurt that her children do not contact her on a regular basis. She does feel that it would be beneficial to discuss her emotions with CSW. Nurse will send CSW referral.  Subjective: Patient reports that she has had diabetes for over 20 years. She has not been taking her blood sugar regularly due to her blindness and the understanding that her aide is not able to physically assist her. Nurse encouraged patient to take her blood sugar during the phone call and her sister was able to assist her by articulating where she needed to guider her finger to apply the blood. Nurse congratulated patient when she was able to perform task and encouraged her to do the same process with her aide daily. Patient verbalized that she would take blood sugar daily. Patient's blood sugar was 189 and her current A1c is 8.8. Patient explained that she does want to lose weight to feel better and to become healthier. She admits that she does not do much physical activity and knows this would help her overall health status. Patient states that she has a lot of pain that limits her physically and she does have an PCP appointment to discuss pain on 10/02/19.   Interventions: Nurse advised patient  to inform PCP during 10/02/19 appointment that she needs a new rollator walker and tub chair. Nurse provided encouragement,education, and resources regarding diabetes, weight loss and increasing physical activity as tolerated.  Encounter Medications:  Outpatient Encounter Medications as of 10/01/2019  Medication Sig Note  . albuterol (PROVENTIL HFA;VENTOLIN HFA) 108 (90 BASE) MCG/ACT inhaler Inhale 2 puffs into the lungs every 4 (four) hours as needed for wheezing or shortness of breath. For short   . ammonium lactate (AMLACTIN) 12 % lotion Apply 1 application topically as needed for dry skin.   Marland Kitchen aspirin EC 81 MG tablet Take 81 mg by mouth daily.   . B-D UF III MINI PEN NEEDLES 31G X 5 MM MISC U 1 PEN NEEDLE QID WITH EACH INJECTION   . cetirizine (ZYRTEC) 10 MG tablet Take 10 mg by mouth at bedtime.    Marland Kitchen esomeprazole (NEXIUM) 40 MG capsule Take 40 mg by mouth daily.    . fluticasone (FLONASE) 50 MCG/ACT nasal spray Place 1 spray into both nostrils daily.    Marland Kitchen HUMALOG KWIKPEN 100 UNIT/ML KiwkPen Inject 8-14 Units into the skin See admin instructions. Inject 8 units in morning, 12 units at lunch, and 16 units at dinner   . levothyroxine (SYNTHROID, LEVOTHROID) 150 MCG tablet Take 150 mcg by mouth daily before breakfast.   . Linaclotide (LINZESS) 145 MCG CAPS capsule Take 145 mcg by mouth daily as needed (constipation).    . metolazone (ZAROXOLYN) 2.5 MG tablet Take 1 tablet (2.5 mg total) by mouth daily.   Marland Kitchen  ondansetron (ZOFRAN ODT) 4 MG disintegrating tablet Take 1 tablet (4 mg total) by mouth every 8 (eight) hours as needed for nausea or vomiting.   . potassium chloride SA (KLOR-CON) 20 MEQ tablet TAKE 1 TABLET BY MOUTH DAILY    . pregabalin (LYRICA) 150 MG capsule Take 150 mg by mouth 3 (three) times daily.    . simvastatin (ZOCOR) 20 MG tablet Take 20 mg by mouth every evening.    Marland Kitchen spironolactone (ALDACTONE) 25 MG tablet TAKE 1/2 TABLET BY MOUTH DAILY   . torsemide (DEMADEX) 20 MG tablet TAKE  4 TABLETS (80 MG TOTAL) BY MOUTH DAILY.    Marland Kitchen TRESIBA FLEXTOUCH 200 UNIT/ML SOPN Inject 60 Units into the skin at bedtime.    . triamcinolone cream (KENALOG) 0.1 % Apply 1 application topically daily as needed (irritation).    Marland Kitchen azithromycin (ZITHROMAX) 250 MG tablet  10/02/2019: Medication completed  . ibuprofen (ADVIL) 200 MG tablet Take by mouth. 10/02/2019: Bad for kidneys patient does not take  . methocarbamol (ROBAXIN) 500 MG tablet Take 1 tablet (500 mg total) by mouth every 12 (twelve) hours as needed for muscle spasms. (Patient not taking: Reported on 10/01/2019) 10/02/2019: Completed medication   No facility-administered encounter medications on file as of 10/01/2019.    Functional Status:  In your present state of health, do you have any difficulty performing the following activities: 10/02/2019 05/29/2019  Hearing? Y N  Comment has hearing aids -  Vision? 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 N N  Walking or climbing stairs? Y Y  Comment per patient due to pain in right leg and hips blind  Dressing or bathing? Y Y  Comment patient has aide daily to assist due to blindness and disabilities blind  Doing errands, shopping? Y Y  Comment yes, patient can cognitively go by herself but need transportation due to blindness. Patient's aide goes to the grocery store blind  Preparing Food and eating ? Y Y  Comment aide does cooking has aide  Using the Toilet? Y N  Comment patient has a hard time wiping herself due to obesity -  In the past six months, have you accidently leaked urine? Y N  Comment takes many diuretics -  Do you have problems with loss of bowel control? N N  Managing your Medications? [name]  Comment gets assistance to manage pill box managed by nurse  Managing your Finances? N N  Housekeeping or managing your Housekeeping? Y Y  Comment has aide to assist blind, has aide  Some recent data might be hidden     Fall/Depression Screening: Fall Risk  10/01/2019 05/29/2019 02/09/2017  Falls in the past year? 0 0 No  Number falls in past yr: 0 0 -  Injury with Fall? 0 0 -  Risk for fall due to : Impaired vision - -  Follow up Falls prevention discussed;Education provided;Falls evaluation completed - -   PHQ 2/9 Scores 10/01/2019 05/29/2019  PHQ - 2 Score 3 1  PHQ- 9 Score 5 -   THN CM Care Plan Problem One     Most Recent Value  Care Plan Problem One  Knowledge Deficit of diabetes management related to not changing life-style habits and diet adherence  Role Documenting the Problem One  [facility] [place] for Problem One  Active  THN Long Term Goal   Member will report decreased A1c by 0.5-1 within 90  days  THN Long Term Goal Start Date  10/02/19  Interventions for Problem One Long Term Goal  RN discussed importance of monitoring sugar and counting carbohydrates, encouraged patient to continue with daily FBS CBG monitoring and record data, encouraged medication adherence, discussed reflecting back to diet when glucose values are high, sent Diabetes information packet, sent education about counting carbohydrates, discuss weight management, discussed exercise and how it relates to diabetes, sent weight loss tips education.  THN CM Short Term Goal #1   Member will report taking FBS daily within the next 30 days  THN CM Short Term Goal #1 Start Date  10/02/19  Interventions for Short Term Goal #1  Discussed importance of taking FBS daily to have baseline of her diabetes disease, discussed that she could taking her blood sugar with the assitance of her aide daily by having them articulate where patient needs to apply the fingerstick blood, had patient take her blood sugar with the assistance of her sister during the phone visit and congratulated her on a job well done, patient verbalized that she will take her FBS daily.  THN CM Short Term Goal #2   Member will lose 1-3 pound within the next 30 days   THN CM Short Term Goal #2 Start Date  10/02/19  Interventions for Short Term Goal #2  Nurse discussed with patient how increased physical activity will help her to maintain her strength, will help her to lower her blood sugar, keep her heart healthy as well as will help her to lose weight, discussed weight loss tips and portion control, encouraged patient to monitor carbohydrates, sugar and high fat foods, and sent EMMI weight loss education and chair exercise printouts, provided [employer] senior exercise TV program resource, provided [employer] for exercise and senior programs     Plan: [place] will send PCP Barrier Letter and today's note assessment, will send CSW referral, will send patient diabetes packet, Copy, Financial controller, EMMI weight loss education and chair exercises, [employer] senior exercise TV program resource, and [employer] for exercise classes and senior programs. RN Health Coach will call patient within the month of June and patient agrees to future outreach calls.   [name] RN, BSN [place] [phone] [email or web address]

## 2019-10-04 ENCOUNTER — Other Ambulatory Visit: Payer: Self-pay | Admitting: *Deleted

## 2019-10-04 NOTE — Patient Outreach (Signed)
[place] [facility]) Care Management  10/04/2019  Amy [name] [birth year] [id number]   CSW received referral on 10/02/2019 to assist pt with symptoms of depression.  CSW made contact with pt and confirmed her identity.  CSW introduced self, role and reason for call.  Pt reports "not feeling too good" but denies any depression. CSW inquired with pt about her conversation with [facility] RN who had reported she was struggling with her "past" to which she say, "oh, yeh, my past is very depressing". CSW pointed out to pt that she initially said se was not depressed and to that she did acknowledge the need for some counseling to deal with her "emotions".   Pt does not want to entertain medication but is willing to see a counselor for psychotherapy. She also denies any SI/HI; current or past.  CSW will obtain counselor/therapy options for pt and follow up in 1-2 weeks.   Eduard Clos, MSW, [place] Worker  [place] [phone]

## 2019-10-05 NOTE — Progress Notes (Signed)
Subjective: Amy [name] presents today at risk foot care with history of diabetic neuropathy and painful mycotic nails b/l that are difficult to trim. Pain interferes with ambulation. Aggravating factors include wearing enclosed shoe gear. Pain is relieved with periodic professional debridement.  She has h/o neuropathy and is taking pregabalin for this.  She has h/o ulceration of right heel which has healed, but she relates callus build-up.  Amy Hatchet, MD is patient's PCP.  She voices no new pedal concerns on today's visit.  Past Medical History:  Diagnosis Date  . Arthritis   . Asthma   . Blind   . Blindness and low vision    left eye glass eye,  legally blind in right eye  . Diabetes mellitus   . Diabetic neuropathy ([place])   . Hyperlipidemia   . Hypertension   . Hypothyroidism   . Spinal stenosis      Current Outpatient Medications on File Prior to Visit  Medication Sig Dispense Refill  . albuterol (PROVENTIL HFA;VENTOLIN HFA) 108 (90 BASE) MCG/ACT inhaler Inhale 2 puffs into the lungs every 4 (four) hours as needed for wheezing or shortness of breath. For short    . ammonium lactate (AMLACTIN) 12 % lotion Apply 1 application topically as needed for dry skin. 400 g 0  . aspirin EC 81 MG tablet Take 81 mg by mouth daily.    Marland Kitchen azithromycin (ZITHROMAX) 250 MG tablet     . B-D UF III MINI PEN NEEDLES 31G X 5 MM MISC U 1 PEN NEEDLE QID WITH EACH INJECTION    . cetirizine (ZYRTEC) 10 MG tablet Take 10 mg by mouth at bedtime.     Marland Kitchen esomeprazole (NEXIUM) 40 MG capsule Take 40 mg by mouth daily.     . fluticasone (FLONASE) 50 MCG/ACT nasal spray Place 1 spray into both nostrils daily.     Marland Kitchen HUMALOG KWIKPEN 100 UNIT/ML KiwkPen Inject 8-14 Units into the skin See admin instructions. Inject 8 units in morning, 12 units at lunch, and 16 units at dinner    . ibuprofen (ADVIL) 200 MG tablet Take by mouth.    . levothyroxine (SYNTHROID, LEVOTHROID) 150 MCG tablet Take 150 mcg by mouth  daily before breakfast.    . Linaclotide (LINZESS) 145 MCG CAPS capsule Take 145 mcg by mouth daily as needed (constipation).     . methocarbamol (ROBAXIN) 500 MG tablet Take 1 tablet (500 mg total) by mouth every 12 (twelve) hours as needed for muscle spasms. (Patient not taking: Reported on 10/01/2019) 15 tablet 0  . metolazone (ZAROXOLYN) 2.5 MG tablet Take 1 tablet (2.5 mg total) by mouth daily. 30 tablet 3  . ondansetron (ZOFRAN ODT) 4 MG disintegrating tablet Take 1 tablet (4 mg total) by mouth every 8 (eight) hours as needed for nausea or vomiting. 10 tablet 0  . potassium chloride SA (KLOR-CON) 20 MEQ tablet TAKE 1 TABLET BY MOUTH DAILY  30 tablet 0  . pregabalin (LYRICA) 150 MG capsule Take 150 mg by mouth 3 (three) times daily.     . simvastatin (ZOCOR) 20 MG tablet Take 20 mg by mouth every evening.     Marland Kitchen spironolactone (ALDACTONE) 25 MG tablet TAKE 1/2 TABLET BY MOUTH DAILY 315 tablet 0  . torsemide (DEMADEX) 20 MG tablet TAKE 4 TABLETS (80 MG TOTAL) BY MOUTH DAILY.  120 tablet 3  . TRESIBA FLEXTOUCH 200 UNIT/ML SOPN Inject 60 Units into the skin at bedtime.     . triamcinolone cream (  KENALOG) 0.1 % Apply 1 application topically daily as needed (irritation).      No current facility-administered medications on file prior to visit.     Allergies  Allergen Reactions  . Penicillins Hives and Swelling    Has patient had a PCN reaction causing immediate rash, facial/tongue/throat swelling, SOB or lightheadedness with hypotension: yes- face swelling Has patient had a PCN reaction causing severe rash involving mucus membranes or skin necrosis: no Has patient had a PCN reaction that required hospitalization unknown (childhood allergy) Has patient had a PCN reaction occurring within the last 10 years: no If all of the above answers are "NO", then may proceed with Cephalosporin use.   . Vicodin [Hydrocodone-Acetaminophen] Nausea And Vomiting  . Codeine Nausea Only  . Iodine-131 Hives  .  Iohexol Hives    Pt developed itching and hives along with nasal congestion; needs 13 hour premeds for future studies, Onset Date: [id number]   . Lisinopril Cough  . Sulfa Antibiotics Nausea And Vomiting    Objective: Amy [name] is a pleasant 59 y.o. y.o. Patient Race: Black or African American [2]  female in NAD. AAO x 3.  Vitals:   09/26/19 0847  Temp: (!) 96.4 F (35.8 C)    Vascular Examination: Capillary refill time to digits immediate b/l. Palpable DP pulses b/l. Palpable PT pulses b/l. Pedal hair present b/l. Skin temperature gradient within normal limits b/l.   Dermatological Examination: Pedal skin with normal turgor, texture and tone bilaterally. No open wounds bilaterally. No interdigital macerations bilaterally. Toenails 1-5 b/l elongated, dystrophic, thickened, crumbly with subungual debris and tenderness to dorsal palpation. Hyperkeratotic lesion medial aspect of posterior heel with subdermal hemorrhage. No surrounding erythema, no edema, no warmth, and no flocculence when palpated. No signs/symptoms of infection noted.  Musculoskeletal: Normal muscle strength 5/5 to all lower extremity muscle groups bilaterally. No pain crepitus or joint limitation noted with ROM b/l. Hallux valgus with bunion deformity noted b/l. Hammertoes noted to the 2-5 bilaterally.  Neurological Examination: Protective sensation intact 5/5 intact bilaterally with 10g monofilament b/l. Vibratory sensation intact b/l. Proprioception intact bilaterally.  Assessment: 1. Pain due to onychomycosis of toenail   2. Pre-ulcerative calluses   3. Diabetic peripheral neuropathy associated with type 2 diabetes mellitus ([place])   Plan: -Examined patient. -Continue diabetic foot care principles. Literature dispensed on today.  -Toenails 1-5 b/l were debrided in length and girth with sterile nail nippers and dremel without iatrogenic bleeding.  -Patient to continue soft, supportive shoe gear  daily. -Patient to report any pedal injuries to medical professional immediately. -Preulcerative callus posteromedial right heel pared utilizing sterile scalpel without incident. This appears to be a chronic problem for her. Discussed need for pressure relief of feet when sleeping. Recommended heel pillows which we have in the office or she can purchase them from [employer]. She is to wear when in bed. She related understanding. -Patient/POA to call should there be question/concern in the interim.  Return in about 3 months (around 12/27/2019) for diabetic nail trim.  Marzetta Board, DPM

## 2019-10-09 ENCOUNTER — Encounter: Payer: Self-pay | Admitting: Family Medicine

## 2019-10-09 ENCOUNTER — Other Ambulatory Visit: Payer: Self-pay

## 2019-10-09 ENCOUNTER — Ambulatory Visit (INDEPENDENT_AMBULATORY_CARE_PROVIDER_SITE_OTHER): Payer: Medicare Other | Admitting: Family Medicine

## 2019-10-09 VITALS — BP 131/70 | HR 60 | Ht 59.0 in | Wt 226.0 lb

## 2019-10-09 DIAGNOSIS — Z9989 Dependence on other enabling machines and devices: Secondary | ICD-10-CM

## 2019-10-09 DIAGNOSIS — G4733 Obstructive sleep apnea (adult) (pediatric): Secondary | ICD-10-CM | POA: Diagnosis not present

## 2019-10-09 NOTE — Progress Notes (Addendum)
[name] DOB: 27-Apr-1961  REASON FOR VISIT: follow up HISTORY FROM: patient  Chief Complaint  Patient presents with  . Follow-up    Rm 2 here for a osa f/u. Pt says she is not able to use the machine due to too much pressure.     HISTORY OF PRESENT ILLNESS: Today 10/09/19 Amy [name] is a 59 y.o. female here today for follow up of OSA on CPAP. She is doing well. She has had some concerns of congestion and allergies over the past week. She feels that CPAP is hard to use when this happens. She feels that pressure is too strong for her. She usually tolerates CPAP well and denies any persistent concerns.   Compliance report dated 09/08/2019 through 10/07/2019 reveals that she used CPAP 29 of the past 30 days for compliance of 97%.  She used CPAP greater than 4 hours 23 of the past 30 days for compliance of 77%.  Average usage was 5 hours and 46 minutes.  Residual AHI was 4.1 on 8 cm of water and an EPR of 3.  There was no significant leak noted.  HISTORY: (copied from [employer] note on 09/01/2017)  Amy [name] is a 59 year old right-handed woman with an underlying medical history of diabetes, blindness (with L prosthetic eye and R eye blindness d/t diabetes), hyperlipidemia, asthma, arthritis, spinal stenosis, thyroid disease, nephropathy, orthostatic hypotension deemed secondary to diabetic autonomic neuropathy (seen by Dr. Leta Baptist in 9/17), and morbid obesity, who presents for follow up consultation of her sleep apnea, after recent sleep study testing. The patient is unaccompanied today. I first met her on 09/22/16, at the request of her PCP, at which time she reported snoring and excessive daytime somnolence. I invited her for a sleep study. She had a baseline sleep study, followed by a CPAP titration study. I went over her test results with her in detail today. Baseline sleep study from 10/02/2016 showed a sleep efficiency of 84.8%, sleep latency of 18.5 minutes, REM  latency of 72.5 minutes, she had absence of slow-wave sleep, an increased percentage of stage II sleep, REM sleep at 16.8%. Total AHI was 6.8 per hour, REM AHI 28 per hour, supine sleep was nearly absent. Average oxygen saturation was 95%, nadir was 78%, time below 89% saturation of 24 minutes. Based on her sleep-related complaints and significant desaturations noted during REM sleep I suggested she return for a full night CPAP titration study. She had this on 10/18/2016, sleep efficiency was 85.4%, sleep latency of 34 minutes, REM latency was 194 minutes. She had slow-wave sleep at 7.8%, REM sleep at 12.1%. She was fitted with medium nasal pillows and CPAP was titrated from 5 cm to 9 cm. On the final pressure her AHI was 0 per hour, nonsupine REM sleep was achieved and alternated was 86%. I suggested a home CPAP pressure of 10 cm.  12/29/16 Dr. Rexene Alberts I reviewed her CPAP compliance data from 11/28/16 to 12/27/16, which is a total of 30 days, during which time she used her CPAP 26 days, with percent used days greater than 4 hours of 63%, indicating suboptimal compliance, average usage of 5 hours and 27 minutes, average AHI of 3/hour, leak low with the 95th percentile of 2.8 lpm on a pressure of 10 cm with EPR of 3  UPDATE  09/27/2018CM  [name], 59 year old  Female returns for follow-up with a history of obstructive sleep apnea on CPAP Compliance data dated 01/10/2017 through 02/08/2017  shows greater than 4 hours at 90% for 27  Days. Average usage 6 hours 37  Minutes.  10 cm of pressure. EPR 3. AHI 2.9AHI 2.9. Leak low  with the 95th percentile.  She reports less daytime drowiness. She has a new caregiver  who requires education on cleaning and handling the CPAP equipment. She returns for reevaluation  UPDATE 4/24/2019CM Amy [name], 59 year old female returns for follow-up with a history of  obstructive sleep apnea on CPAP.  She is not having any problems with her machine she denies any air leak from her mask.   She reports the last few days  she has been stuffy and not use the machine, she has allergies.  CPAP compliance dated 08/05/2017-09/03/2017 showed compliance greater than 4 hours 83% for 25 days.  Average usage 6 hours 38.  Set pressure 8 cm.  AHI 3.6 leaks 95 percentile at 2.4.  She has a caregiver 4 hours a day from 10-2 the patient is blind.  She returns for reevaluation of    REVIEW OF SYSTEMS: Out of a complete 14 system review of symptoms, the patient complains only of the following symptoms, none and all other reviewed systems are negative.   ALLERGIES: Allergies  Allergen Reactions  . Penicillins Hives and Swelling    Has patient had a PCN reaction causing immediate rash, facial/tongue/throat swelling, SOB or lightheadedness with hypotension: yes- face swelling Has patient had a PCN reaction causing severe rash involving mucus membranes or skin necrosis: no Has patient had a PCN reaction that required hospitalization unknown (childhood allergy) Has patient had a PCN reaction occurring within the last 10 years: no If all of the above answers are "NO", then may proceed with Cephalosporin use.   . Vicodin [Hydrocodone-Acetaminophen] Nausea And Vomiting  . Codeine Nausea Only  . Iodine-131 Hives  . Iohexol Hives    Pt developed itching and hives along with nasal congestion; needs 13 hour premeds for future studies, Onset Date: [id number]   . Lisinopril Cough  . Sulfa Antibiotics Nausea And Vomiting    HOME MEDICATIONS: Outpatient Medications Prior to Visit  Medication Sig Dispense Refill  . albuterol (PROVENTIL HFA;VENTOLIN HFA) 108 (90 BASE) MCG/ACT inhaler Inhale 2 puffs into the lungs every 4 (four) hours as needed for wheezing or shortness of breath. For short    . ammonium lactate (AMLACTIN) 12 % lotion Apply 1 application topically as needed for dry skin. 400 g 0  . aspirin EC 81 MG tablet Take 81 mg by mouth daily.    . B-D UF III MINI PEN NEEDLES 31G X 5 MM MISC U 1 PEN  NEEDLE QID WITH EACH INJECTION    . esomeprazole (NEXIUM) 40 MG capsule Take 40 mg by mouth daily.     . fluticasone (FLONASE) 50 MCG/ACT nasal spray Place 1 spray into both nostrils daily.     Marland Kitchen HUMALOG KWIKPEN 100 UNIT/ML KiwkPen Inject 8-14 Units into the skin See admin instructions. Inject 8 units in morning, 12 units at lunch, and 16 units at dinner    . ibuprofen (ADVIL) 200 MG tablet Take by mouth.    . levothyroxine (SYNTHROID, LEVOTHROID) 150 MCG tablet Take 150 mcg by mouth daily before breakfast.    . Linaclotide (LINZESS) 145 MCG CAPS capsule Take 145 mcg by mouth daily as needed (constipation).     . methocarbamol (ROBAXIN) 500 MG tablet Take 1 tablet (500 mg total) by mouth every 12 (twelve) hours as needed for muscle spasms. 15 tablet  0  . metolazone (ZAROXOLYN) 2.5 MG tablet Take 1 tablet (2.5 mg total) by mouth daily. 30 tablet 3  . ondansetron (ZOFRAN ODT) 4 MG disintegrating tablet Take 1 tablet (4 mg total) by mouth every 8 (eight) hours as needed for nausea or vomiting. 10 tablet 0  . potassium chloride SA (KLOR-CON) 20 MEQ tablet TAKE 1 TABLET BY MOUTH DAILY  30 tablet 0  . pregabalin (LYRICA) 150 MG capsule Take 150 mg by mouth 3 (three) times daily.     . simvastatin (ZOCOR) 20 MG tablet Take 20 mg by mouth every evening.     Marland Kitchen spironolactone (ALDACTONE) 25 MG tablet TAKE 1/2 TABLET BY MOUTH DAILY 315 tablet 0  . torsemide (DEMADEX) 20 MG tablet TAKE 4 TABLETS (80 MG TOTAL) BY MOUTH DAILY.  120 tablet 3  . TRESIBA FLEXTOUCH 200 UNIT/ML SOPN Inject 60 Units into the skin at bedtime.     Marland Kitchen azithromycin (ZITHROMAX) 250 MG tablet     . cetirizine (ZYRTEC) 10 MG tablet Take 10 mg by mouth at bedtime.     . triamcinolone cream (KENALOG) 0.1 % Apply 1 application topically daily as needed (irritation).      No facility-administered medications prior to visit.    PAST MEDICAL HISTORY: Past Medical History:  Diagnosis Date  . Arthritis   . Asthma   . Blind   . Blindness  and low vision    left eye glass eye,  legally blind in right eye  . Diabetes mellitus   . Diabetic neuropathy ([place])   . Hyperlipidemia   . Hypertension   . Hypothyroidism   . Spinal stenosis     PAST SURGICAL HISTORY: Past Surgical History:  Procedure Laterality Date  . ABDOMINAL HYSTERECTOMY  1990  . [place]   x 2  . CHOLECYSTECTOMY  2008  . ENUCLEATION Bilateral 09/15/1998  . EYE SURGERY  2016   fitting artificial eye    FAMILY HISTORY: Family History  Problem Relation Age of Onset  . Diabetes Sister   . Glaucoma Sister   . Hypertension Sister   . Healthy Mother   . Healthy Father   . Stroke Brother   . Heart attack Paternal Aunt   . Breast cancer Neg Hx     SOCIAL HISTORY: Social History   Socioeconomic History  . Marital status: Single    Spouse name: Not on file  . Number of children: 2  . Years of education: 26  . Highest education level: Not on file  Occupational History  . Occupation: N/A    Comment: disabled  Tobacco Use  . Smoking status: Former Smoker    Types: Cigarettes    Quit date: 05/22/1992    Years since quitting: 27.4  . Smokeless tobacco: Never Used  Substance and Sexual Activity  . Alcohol use: No    Comment: quit 20 yrs ago  . Drug use: No  . Sexual activity: Not Currently  Other Topics Concern  . Not on file  Social History Narrative   Lives alone   caffeine drinks 2 cups of coffee a day, occasional soda    Social Determinants of Health   Financial Resource Strain: Medium Risk  . Difficulty of Paying Living Expenses: Somewhat hard  Food Insecurity: No Food Insecurity  . Worried About [name] in the Last Year: Never true  . Ran Out of Food in the Last Year: Never true  Transportation Needs: No Transportation Needs  .  Lack of Transportation (Medical): No  . Lack of Transportation (Non-Medical): No  Physical Activity:   . Days of Exercise per Week:   . Minutes of Exercise per Session:    Stress:   . Feeling of Stress :   Social Connections:   . Frequency of Communication with Friends and Family:   . Frequency of Social Gatherings with Friends and Family:   . Attends Religious Services:   . Active Member of Clubs or Organizations:   . Attends Archivist Meetings:   Marland Kitchen Marital Status:   Intimate Partner Violence:   . Fear of Current or Ex-Partner:   . Emotionally Abused:   Marland Kitchen Physically Abused:   . Sexually Abused:       PHYSICAL EXAM  Vitals:   10/09/19 1427  BP: 131/70  Pulse: 60  Weight: 226 lb (102.5 kg)  Height: _0  (1.499 m)   Body mass index is 45.65 kg/m.  Generalized: Well developed, in no acute distress  Cardiology: normal rate and rhythm, no murmur noted Respiratory: clear to auscultation bilaterally  Neurological examination  Mentation: Alert oriented to time, place, history taking. Follows all commands speech and language fluent Cranial nerve II-XII: Patient is blind. Facial sensation and strength were normal. Head turning and shoulder shrug  were normal and symmetric. Motor: The motor testing reveals 5 over 5 strength of all 4 extremities.  Gait and station: in wheelchair today   DIAGNOSTIC DATA (LABS, IMAGING, TESTING) - I reviewed patient records, labs, notes, testing and imaging myself where available.  No flowsheet data found.   Lab Results  Component Value Date   WBC 11.6 (H) 07/11/2019   HGB 12.8 07/11/2019   HCT 40.9 07/11/2019   MCV 82.3 07/11/2019   PLT 198 07/11/2019      Component Value Date/Time   NA 148 (H) 07/11/2019 1551   NA 148 (H) 02/18/2019 1109   K 4.0 07/11/2019 1551   CL 108 07/11/2019 1551   CO2 30 07/11/2019 1551   GLUCOSE 254 (H) 07/11/2019 1551   BUN 32 (H) 07/11/2019 1551   BUN 51 (H) 02/18/2019 1109   CREATININE 1.62 (H) 07/11/2019 1551   CREATININE 0.92 10/22/2012 1351   CALCIUM 9.8 07/11/2019 1551   PROT 7.7 07/11/2019 1551   ALBUMIN 3.3 (L) 07/11/2019 1551   AST 16 07/11/2019  1551   ALT 17 07/11/2019 1551   ALKPHOS 101 07/11/2019 1551   BILITOT 0.6 07/11/2019 1551   GFRNONAA 34 (L) 07/11/2019 1551   GFRAA 40 (L) 07/11/2019 1551   Lab Results  Component Value Date   CHOL 170 10/22/2012   HDL 56 10/22/2012   LDLCALC 92 10/22/2012   TRIG 112 10/22/2012   CHOLHDL 3.0 10/22/2012   Lab Results  Component Value Date   HGBA1C 10.7 (H) 11/02/2018   Lab Results  Component Value Date   VITAMINB12 325 05/25/2011   Lab Results  Component Value Date   TSH 0.279 (L) 12/11/2017       ASSESSMENT AND PLAN 59 y.o. year old female  has a past medical history of Arthritis, Asthma, Blind, Blindness and low vision, Diabetes mellitus, Diabetic neuropathy ([place]), Hyperlipidemia, Hypertension, Hypothyroidism, and Spinal stenosis. here with     ICD-10-CM   1. OSA on CPAP  G47.33    Z99.89     She is doing well on CPAP therapy.  Compliance report reveals excellent daily compliance and acceptable 4-hour compliance.  She has noted some difficulty with pressure settings  over the past week.  She contributes this to having more allergy symptoms with congestion.  She does not usually have any concerns with her pressure settings.  I have asked that she monitor this closely.  We may adjust pressure settings if needed in the future.  I am hopeful that once allergy symptoms resolve she will no longer have any concerns with CPAP.  She was encouraged to continue using CPAP nightly and for greater than 4 hours each night.  She will continue close follow-up with her healthcare team.  She will follow-up with me in 1 year, sooner if needed.  She verbalizes understanding and agreement with this plan.   No orders of the defined types were placed in this encounter.    No orders of the defined types were placed in this encounter.     I spent 15 minutes with the patient. 50% of this time was spent counseling and educating patient on plan of care and medications.    Debbora Presto, FNP-C  10/09/2019, 2:58 PM [facility] [address] [place], [place] [address] [phone]   I reviewed the above note and documentation by the Nurse Practitioner and agree with the history, exam, assessment and plan as outlined above. I was available for consultation. [name], MD, PhD [facility])

## 2019-10-09 NOTE — Patient Instructions (Signed)
Please continue using your CPAP regularly. While your insurance requires that you use CPAP at least 4 hours each night on 70% of the nights, I recommend, that you not skip any nights and use it throughout the night if you can. Getting used to CPAP and staying with the treatment long term does take time and patience and discipline. Untreated obstructive sleep apnea when it is moderate to severe can have an adverse impact on cardiovascular health and raise her risk for heart disease, arrhythmias, hypertension, congestive heart failure, stroke and diabetes. Untreated obstructive sleep apnea causes sleep disruption, nonrestorative sleep, and sleep deprivation. This can have an impact on your day to day functioning and cause daytime sleepiness and impairment of cognitive function, memory loss, mood disturbance, and problems focussing. Using CPAP regularly can improve these symptoms.   Follow up in 1 year, sooner if needed   Sleep Apnea Sleep apnea affects breathing during sleep. It causes breathing to stop for a short time or to become shallow. It can also increase the risk of:  Heart attack.  Stroke.  Being very overweight (obese).  Diabetes.  Heart failure.  Irregular heartbeat. The goal of treatment is to help you breathe normally again. What are the causes? There are three kinds of sleep apnea:  Obstructive sleep apnea. This is caused by a blocked or collapsed airway.  Central sleep apnea. This happens when the brain does not send the right signals to the muscles that control breathing.  Mixed sleep apnea. This is a combination of obstructive and central sleep apnea. The most common cause of this condition is a collapsed or blocked airway. This can happen if:  Your throat muscles are too relaxed.  Your tongue and tonsils are too large.  You are overweight.  Your airway is too small. What increases the risk?  Being overweight.  Smoking.  Having a small airway.  Being  older.  Being female.  Drinking alcohol.  Taking medicines to calm yourself (sedatives or tranquilizers).  Having family members with the condition. What are the signs or symptoms?  Trouble staying asleep.  Being sleepy or tired during the day.  Getting angry a lot.  Loud snoring.  Headaches in the morning.  Not being able to focus your mind (concentrate).  Forgetting things.  Less interest in sex.  Mood swings.  Personality changes.  Feelings of sadness (depression).  Waking up a lot during the night to pee (urinate).  Dry mouth.  Sore throat. How is this diagnosed?  Your medical history.  A physical exam.  A test that is done when you are sleeping (sleep study). The test is most often done in a sleep lab but may also be done at home. How is this treated?   Sleeping on your side.  Using a medicine to get rid of mucus in your nose (decongestant).  Avoiding the use of alcohol, medicines to help you relax, or certain pain medicines (narcotics).  Losing weight, if needed.  Changing your diet.  Not smoking.  Using a machine to open your airway while you sleep, such as: ? An oral appliance. This is a mouthpiece that shifts your lower jaw forward. ? A CPAP device. This device blows air through a mask when you breathe out (exhale). ? An EPAP device. This has valves that you put in each nostril. ? A BPAP device. This device blows air through a mask when you breathe in (inhale) and breathe out.  Having surgery if other treatments do not   work. It is important to get treatment for sleep apnea. Without treatment, it can lead to:  High blood pressure.  Coronary artery disease.  In men, not being able to have an erection (impotence).  Reduced thinking ability. Follow these instructions at home: Lifestyle  Make changes that your doctor recommends.  Eat a healthy diet.  Lose weight if needed.  Avoid alcohol, medicines to help you relax, and some pain  medicines.  Do not use any products that contain nicotine or tobacco, such as cigarettes, e-cigarettes, and chewing tobacco. If you need help quitting, ask your doctor. General instructions  Take over-the-counter and prescription medicines only as told by your doctor.  If you were given a machine to use while you sleep, use it only as told by your doctor.  If you are having surgery, make sure to tell your doctor you have sleep apnea. You may need to bring your device with you.  Keep all follow-up visits as told by your doctor. This is important. Contact a doctor if:  The machine that you were given to use during sleep bothers you or does not seem to be working.  You do not get better.  You get worse. Get help right away if:  Your chest hurts.  You have trouble breathing in enough air.  You have an uncomfortable feeling in your back, arms, or stomach.  You have trouble talking.  One side of your body feels weak.  A part of your face is hanging down. These symptoms may be an emergency. Do not wait to see if the symptoms will go away. Get medical help right away. Call your local emergency services (911 in the [place] Do not drive yourself to the hospital. Summary  This condition affects breathing during sleep.  The most common cause is a collapsed or blocked airway.  The goal of treatment is to help you breathe normally while you sleep. This information is not intended to replace advice given to you by your health care provider. Make sure you discuss any questions you have with your health care provider. Document Revised: 02/16/2018 Document Reviewed: 12/26/2017 Elsevier Patient Education  2020 Elsevier Inc.  

## 2019-10-10 ENCOUNTER — Other Ambulatory Visit (HOSPITAL_COMMUNITY): Payer: Self-pay | Admitting: Internal Medicine

## 2019-10-10 NOTE — Progress Notes (Signed)
Order for cpap supplies sent to [facility] via community msg. Confirmation received that the order transmitted was successful.  

## 2019-10-16 ENCOUNTER — Ambulatory Visit: Payer: Self-pay | Admitting: *Deleted

## 2019-10-18 ENCOUNTER — Ambulatory Visit: Payer: Self-pay | Admitting: *Deleted

## 2019-10-24 ENCOUNTER — Other Ambulatory Visit: Payer: Self-pay | Admitting: *Deleted

## 2019-10-24 NOTE — Patient Outreach (Signed)
[place] [facility]) Care Management  10/24/2019  SEYLAH WERNERT 10-14-1960 [id number]  Late Entry  CSW spoke with pt on 10/21/2019 who confirmed appointment has been scheduled for 10/22/2019.  Pt denied any questions or concerns about visit and agrees to follow up call by CSW afterwards.   Eduard Clos, MSW, [place] Worker  [place] [phone]

## 2019-10-25 ENCOUNTER — Ambulatory Visit: Payer: Medicare Other | Admitting: Podiatry

## 2019-10-25 ENCOUNTER — Other Ambulatory Visit: Payer: Self-pay | Admitting: *Deleted
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
## 2019-11-05 ENCOUNTER — Other Ambulatory Visit: Payer: Self-pay | Admitting: *Deleted

## 2019-11-05 NOTE — Patient Outreach (Signed)
[facility]) Care Management  [place]  11/05/2019   Amy [name] [birth year] [id number]  Subjective: Successful telephone outreach call to patient. HIPAA identifiers obtained. Patient states she is doing pretty well. She verbalized that CSW did reached out to her and that she has an appointment with a counselor but does not remember with who or what date. Nurse was able to message CSW and provide patient with the counseling contact number to call to confirm the appointment. Patient also shared that her talking glucose meter is not working and she is not checking her blood sugar. Per patient, the batteries in the meter are new. She added that her PCP ordered the meter but it was denied by insurance. Nurse placed a call to patient's PCP to inquire about what order was sent and she will also place a [employer] referral for assistance. Nurse was able to speak with a [facility] pharmacist who suggested that the patient call the 1-800 number on the meter or strip container and explain that the meter is not working; adding that the company may send another one to the patient. Nurse did discuss this with the patient and she did verbalize understanding. Patient's PCP office did call back and stated that they sent an order for the talking meter.    Encounter Medications:  Outpatient Encounter Medications as of 11/05/2019  Medication Sig Note  . albuterol (PROVENTIL HFA;VENTOLIN HFA) 108 (90 BASE) MCG/ACT inhaler Inhale 2 puffs into the lungs every 4 (four) hours as needed for wheezing or shortness of breath. For short   . ammonium lactate (AMLACTIN) 12 % lotion Apply 1 application topically as needed for dry skin.   Marland Kitchen aspirin EC 81 MG tablet Take 81 mg by mouth daily.   . B-D UF III MINI PEN NEEDLES 31G X 5 MM MISC U 1 PEN NEEDLE QID WITH EACH INJECTION   . esomeprazole (NEXIUM) 40 MG capsule Take 40 mg by mouth daily.    . fluticasone (FLONASE) 50 MCG/ACT nasal spray Place 1 spray  into both nostrils daily.    Marland Kitchen HUMALOG KWIKPEN 100 UNIT/ML KiwkPen Inject 8-14 Units into the skin See admin instructions. Inject 8 units in morning, 12 units at lunch, and 16 units at dinner   . ibuprofen (ADVIL) 200 MG tablet Take by mouth. 10/02/2019: Bad for kidneys patient does not take  . levothyroxine (SYNTHROID, LEVOTHROID) 150 MCG tablet Take 150 mcg by mouth daily before breakfast.   . Linaclotide (LINZESS) 145 MCG CAPS capsule Take 145 mcg by mouth daily as needed (constipation).    . methocarbamol (ROBAXIN) 500 MG tablet Take 1 tablet (500 mg total) by mouth every 12 (twelve) hours as needed for muscle spasms. 10/02/2019: Completed medication  . metolazone (ZAROXOLYN) 2.5 MG tablet Take 1 tablet (2.5 mg total) by mouth daily.   . ondansetron (ZOFRAN ODT) 4 MG disintegrating tablet Take 1 tablet (4 mg total) by mouth every 8 (eight) hours as needed for nausea or vomiting.   . potassium chloride SA (KLOR-CON) 20 MEQ tablet TAKE 1 TABLET BY MOUTH DAILY    . pregabalin (LYRICA) 150 MG capsule Take 150 mg by mouth 3 (three) times daily.    . simvastatin (ZOCOR) 20 MG tablet Take 20 mg by mouth every evening.    Marland Kitchen spironolactone (ALDACTONE) 25 MG tablet TAKE 1/2 TABLET BY MOUTH DAILY   . torsemide (DEMADEX) 20 MG tablet TAKE 4 TABLETS (80 MG TOTAL) BY MOUTH DAILY.    Marland Kitchen  TRESIBA FLEXTOUCH 200 UNIT/ML SOPN Inject 60 Units into the skin at bedtime.     No facility-administered encounter medications on file as of 11/05/2019.    Functional Status:  In your present state of health, do you have any difficulty performing the following activities: 10/02/2019 05/29/2019  Hearing? Y N  Comment has hearing aids -  Vision? 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 N N  Walking or climbing stairs? Y Y  Comment per patient due to pain in right leg and hips blind  Dressing or bathing? Y Y  Comment patient has aide daily to assist due to  blindness and disabilities blind  Doing errands, shopping? Y Y  Comment yes, patient can cognitively go by herself but need transportation due to blindness. Patient's aide goes to the grocery store blind  Preparing Food and eating ? Y Y  Comment aide does cooking has aide  Using the Toilet? Y N  Comment patient has a hard time wiping herself due to obesity -  In the past six months, have you accidently leaked urine? Y N  Comment takes many diuretics -  Do you have problems with loss of bowel control? N N  Managing your Medications? Amy [name]  Comment gets assistance to manage pill box managed by nurse  Managing your Finances? N N  Housekeeping or managing your Housekeeping? Y Y  Comment has aide to assist blind, has aide  Some recent data might be hidden    Fall/Depression Screening: Fall Risk  11/05/2019 10/01/2019 05/29/2019  Falls in the past year? 0 0 0  Number falls in past yr: 0 0 0  Injury with Fall? 0 0 0  Risk for fall due to : Impaired vision Impaired vision -  Follow up Falls prevention discussed;Education provided;Falls evaluation completed Falls prevention discussed;Education provided;Falls evaluation completed -   PHQ 2/9 Scores 10/01/2019 05/29/2019  PHQ - 2 Score 3 1  PHQ- 9 Score 5 -   THN CM Care Plan Problem One     Most Recent Value  Care Plan Problem One Knowledge deficient related to diabetes condition, treatment plan, and lifestyle changes as evidenced by A1c 8.8  Role Documenting the Problem One [place] for Problem One Active  THN Long Term Goal  Member will report decreased A1c by 0.5-1 within 90 days  THN Long Term Goal Start Date 10/02/19  Interventions for Problem One Long Term Goal RN discussed importance of monitoring sugar and carbohydrates, encouraged patient to take daily FBS and record data, encouraged medication adherence, discussed reflecting back to diet when glucose values are high, discuss weight management, discussed exercise and how  it relates to diabetes  THN CM Short Term Goal #1  Member will report taking FBS daily within the next 30 days  THN CM Short Term Goal #1 Start Date 10/02/19  Interventions for Short Term Goal #1 Nurse will call PCP to see if she can assist with getting a talking meter and nurse will place a [employer] referral for assistance, discussed importance of taking FBS daily to have baseline of her diabetes disease, discussed that she could take her blood sugar with the assistance of her aide daily by having them articulate where patient needs to apply the fingerstick blood.  THN CM Short Term Goal #2  Member will start to lose weight  within the next 30 days as evidences by patient stating she has  lost weight  THN CM Short Term Goal #2 Start Date 10/02/19  Interventions for Short Term Goal #2 Nurse discussed with patient how increased physical activity will help her to lose weight, discussed weight loss tips and portion control, encouraged patient to do chair exercises at least weekly to start with, discussed  that she needs to check her weight daily because she has CHF.     Plan:  RN Health Coach will send a referral to pharmacy for meter assistance, will call PCP's office to inform and inquire about meter, will send planning healthy meals booklet to patient, will call patient within the month of August, and patient agrees to future outreach calls.   [name] RN, BSN [place] [phone] [email or web address]

## 2019-11-05 NOTE — Patient Outreach (Signed)
Referral from [name], RN to [place] - Patient states that her PCP sent an order in for [employer]alking       glucose meter and it was denied. Patient does take insulin and is not       taking her blood sugar at this time. RN did ca  ll PCP's office and CMA       called back stating that they did send an order for the meter.   Reason for Consult -> Medication Assistance

## 2019-11-08 ENCOUNTER — Other Ambulatory Visit: Payer: Self-pay | Admitting: *Deleted

## 2019-11-08 NOTE — Patient Outreach (Signed)
[name] Care Management  11/08/2019  Amy [name] 1960/12/05 [id number]  CSW made contact with pt who has forgotten when her next appointment is for psychotherapy.  CSW assisted pt with calling the provider and leaving the address,date and time on her voice mail to retrieve.  Pt plans to make arrangements for a ride.  CSW reminded pt to let us know if there is a need for medical transportation arrangements however she has [employer] and Medicaid transport services already in place.   CSW will plan to touch base again with pt after her therapy session planned for 11/14/2019 at [place], MSW, [place] Worker  [place] [phone]

## 2019-11-15 ENCOUNTER — Ambulatory Visit: Payer: Self-pay | Admitting: *Deleted

## 2019-11-15 ENCOUNTER — Other Ambulatory Visit: Payer: Self-pay | Admitting: *Deleted

## 2019-11-15 NOTE — Patient Outreach (Signed)
[facility]) Care Management  11/15/2019  Amy [name] Feb 27, 1961 [id number]    CSW attempted to reach pt by phone today and was unsuccessful/no voicemail.  CSW will attempt again in 3-4 business days per protocol.   Eduard Clos, MSW, [place] Worker  [place] [phone]

## 2019-11-21 ENCOUNTER — Other Ambulatory Visit: Payer: Self-pay | Admitting: *Deleted

## 2019-11-22 ENCOUNTER — Encounter (HOSPITAL_COMMUNITY): Payer: Self-pay

## 2019-11-22 ENCOUNTER — Other Ambulatory Visit: Payer: Self-pay

## 2019-11-22 ENCOUNTER — Emergency Department (HOSPITAL_COMMUNITY): Payer: Medicare Other

## 2019-11-22 ENCOUNTER — Emergency Department (HOSPITAL_COMMUNITY)
Admission: EM | Admit: 2019-11-22 | Discharge: 2019-11-23 | Disposition: A | Discharge status: Home / Self Care | Payer: Medicare Other | Attending: Emergency Medicine | Admitting: Emergency Medicine

## 2019-11-22 DIAGNOSIS — Z7951 Long term (current) use of inhaled steroids: Secondary | ICD-10-CM | POA: Insufficient documentation

## 2019-11-22 DIAGNOSIS — Z87891 Personal history of nicotine dependence: Secondary | ICD-10-CM | POA: Insufficient documentation

## 2019-11-22 DIAGNOSIS — J45909 Unspecified asthma, uncomplicated: Secondary | ICD-10-CM | POA: Diagnosis not present

## 2019-11-22 DIAGNOSIS — Z7982 Long term (current) use of aspirin: Secondary | ICD-10-CM | POA: Diagnosis not present

## 2019-11-22 DIAGNOSIS — E1140 Type 2 diabetes mellitus with diabetic neuropathy, unspecified: Secondary | ICD-10-CM | POA: Diagnosis not present

## 2019-11-22 DIAGNOSIS — E039 Hypothyroidism, unspecified: Secondary | ICD-10-CM | POA: Diagnosis not present

## 2019-11-22 DIAGNOSIS — R109 Unspecified abdominal pain: Secondary | ICD-10-CM | POA: Diagnosis not present

## 2019-11-22 DIAGNOSIS — E1149 Type 2 diabetes mellitus with other diabetic neurological complication: Secondary | ICD-10-CM | POA: Insufficient documentation

## 2019-11-22 DIAGNOSIS — I10 Essential (primary) hypertension: Secondary | ICD-10-CM | POA: Diagnosis not present

## 2019-11-22 DIAGNOSIS — Z794 Long term (current) use of insulin: Secondary | ICD-10-CM | POA: Insufficient documentation

## 2019-11-22 DIAGNOSIS — R1011 Right upper quadrant pain: Secondary | ICD-10-CM | POA: Insufficient documentation

## 2019-11-22 DIAGNOSIS — M545 Low back pain: Secondary | ICD-10-CM | POA: Diagnosis present

## 2019-11-22 LAB — COMPREHENSIVE METABOLIC PANEL
ALT: 19 U/L (ref 0–44)
AST: 17 U/L (ref 15–41)
Albumin: 3.2 g/dL — ABNORMAL LOW (ref 3.5–5.0)
Alkaline Phosphatase: 94 U/L (ref 38–126)
Anion gap: 11 (ref 5–15)
BUN: 56 mg/dL — ABNORMAL HIGH (ref 6–20)
CO2: 27 mmol/L (ref 22–32)
Calcium: 9.3 mg/dL (ref 8.9–10.3)
Chloride: 109 mmol/L (ref 98–111)
Creatinine, Ser: 1.78 mg/dL — ABNORMAL HIGH (ref 0.44–1.00)
GFR calc Af Amer: 35 mL/min — ABNORMAL LOW (ref >60)
GFR calc non Af Amer: 30 mL/min — ABNORMAL LOW (ref >60)
Glucose, Bld: 241 mg/dL — ABNORMAL HIGH (ref 70–99)
Potassium: 4.6 mmol/L (ref 3.5–5.1)
Sodium: 147 mmol/L — ABNORMAL HIGH (ref 135–145)
Total Bilirubin: 0.8 mg/dL (ref 0.3–1.2)
Total Protein: 7.3 g/dL (ref 6.5–8.1)

## 2019-11-22 LAB — CBC WITH DIFFERENTIAL/PLATELET
Abs Immature Granulocytes: 0.04 10*3/uL (ref 0.00–0.07)
Basophils Absolute: 0 10*3/uL (ref 0.0–0.1)
Basophils Relative: 0 %
Eosinophils Absolute: 0.1 10*3/uL (ref 0.0–0.5)
Eosinophils Relative: 1 %
HCT: 36.4 % (ref 36.0–46.0)
Hemoglobin: 11.7 g/dL — ABNORMAL LOW (ref 12.0–15.0)
Immature Granulocytes: 0 %
Lymphocytes Relative: 12 %
Lymphs Abs: 1.4 10*3/uL (ref 0.7–4.0)
MCH: 26.2 pg (ref 26.0–34.0)
MCHC: 32.1 g/dL (ref 30.0–36.0)
MCV: 81.4 fL (ref 80.0–100.0)
Monocytes Absolute: 0.9 10*3/uL (ref 0.1–1.0)
Monocytes Relative: 8 %
Neutro Abs: 9.4 10*3/uL — ABNORMAL HIGH (ref 1.7–7.7)
Neutrophils Relative %: 79 %
Platelets: 174 10*3/uL (ref 150–400)
RBC: 4.47 MIL/uL (ref 3.87–5.11)
RDW: 16.2 % — ABNORMAL HIGH (ref 11.5–15.5)
WBC: 11.9 10*3/uL — ABNORMAL HIGH (ref 4.0–10.5)
nRBC: 0 % (ref 0.0–0.2)

## 2019-11-22 LAB — URINALYSIS, ROUTINE W REFLEX MICROSCOPIC
Bilirubin Urine: NEGATIVE
Glucose, UA: 50 mg/dL — AB
Ketones, ur: NEGATIVE mg/dL
Leukocytes,Ua: NEGATIVE
Nitrite: NEGATIVE
Protein, ur: NEGATIVE mg/dL
Specific Gravity, Urine: 1.011 (ref 1.005–1.030)
pH: 5 (ref 5.0–8.0)

## 2019-11-22 LAB — LIPASE, BLOOD: Lipase: 27 U/L (ref 11–51)

## 2019-11-22 MED ORDER — FENTANYL CITRATE (PF) 100 MCG/2ML IJ SOLN
25.0000 ug | Freq: Once | INTRAMUSCULAR | Status: AC
  Administered 2019-11-22: 25 ug via INTRAVENOUS
  Filled 2019-11-22: qty 2

## 2019-11-22 MED ORDER — SODIUM CHLORIDE (PF) 0.9 % IJ SOLN
INTRAMUSCULAR | Status: AC
  Filled 2019-11-22: qty 50

## 2019-11-22 NOTE — ED Triage Notes (Addendum)
Pt comes EMS from home with c/o chronic pain and RUQ abdominal pain for 3 weeks. Pt denies any injury. Ambulates with walker.  Blind.

## 2019-11-22 NOTE — ED Provider Notes (Signed)
[place] [facility] DEPT Provider Note   CSN: [id number] Arrival date & time: 11/22/19  1957     History Chief Complaint  Patient presents with  . Back Pain    Amy [name] is a 59 y.o. female.  HPI    Patient presents with concern of pain in the right flank, right upper quadrant, and back. She notes that she always has a history of back issues, described as soreness, but her new issue is different, distinct. Over the past 3 to 4 days she has had pain soreness in the right infracostal region, lateral, posterior anterior. No new nausea, vomiting, pain is worse with palpation.  She is unaware of superficial changes such as rash. No relief with OTC medication.  Past Medical History:  Diagnosis Date  . Arthritis   . Asthma   . Blind   . Blindness and low vision    left eye glass eye,  legally blind in right eye  . Diabetes mellitus   . Diabetic neuropathy ([place])   . Hyperlipidemia   . Hypertension   . Hypothyroidism   . Spinal stenosis     Patient Active Problem List   Diagnosis Date Noted  . Pre-operative cardiovascular examination 02/18/2019  . Septic bursitis of elbow, right 10/30/2018  . CRI (chronic renal insufficiency), stage 3 (moderate) 07/26/2018  . Morbid obesity ([place]) 07/26/2018  . Intractable nausea and vomiting 03/06/2018  . Right leg pain 12/19/2017  . Leukocytosis 12/19/2017  . Benign positional vertigo 12/19/2017  . Weakness 12/12/2017  . Laryngopharyngeal reflux (LPR) 10/19/2017  . Post-nasal drainage 10/19/2017  . Acute sinusitis 10/19/2017  . Degeneration of lumbar intervertebral disc 07/29/2017  . 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 Obstructive sleep apnea on CPAP 02/09/2017  . Chronic diastolic heart failure ([place]) 11/08/2016  . Near syncope 01/30/2016  . SOB (shortness of breath)   . CAP (community acquired  pneumonia) 09/12/2015  . Hypoxia 09/12/2015  . Essential hypertension 07/05/2015  . Hypotension 07/05/2015  . Diabetes mellitus with neurological manifestations ([place]) 11/04/2014  . Hyperlipidemia LDL goal <70 11/04/2014  . Spinal stenosis, multilevel 11/04/2014  . Hyperkalemia 11/01/2014  . Hypoglycemia 08/09/2014  . Type 2 diabetes mellitus with complication, with long-term current use of insulin ([place]) 08/08/2014  . Elevated troponin 08/08/2014  . Nausea vomiting and diarrhea 08/08/2014  . 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 Dizziness 08/06/2012  . Orthostatic hypotension 08/06/2012  . Hypernatremia 08/06/2012  . Acute gastroenteritis 08/13/2011  . Gastroparesis 08/13/2011  . Low back pain 08/13/2011  . Oral thrush 08/13/2011  . Gastroenteritis 05/23/2011  . Dehydration 05/23/2011  . Blindness 05/23/2011  . DM type 1, not at goal, causing eye disease ([place]) 05/23/2011  . Asthma 05/23/2011  . Diarrhea 05/23/2011  . Vomiting 05/23/2011  . Hypothyroidism 05/23/2011  . Diabetic neuropathy ([place]) 05/23/2011  . Diabetic nephropathy ([place]) 05/23/2011    Past Surgical History:  Procedure Laterality Date  . ABDOMINAL HYSTERECTOMY  1990  . [place]   x 2  . CHOLECYSTECTOMY  2008  . ENUCLEATION Bilateral 09/15/1998  . EYE SURGERY  2016   fitting artificial eye     OB History   No obstetric history on file.    Obstetric Comments  Pt has two sons.        Family History  Problem Relation  Age of Onset  . Diabetes Sister   . Glaucoma Sister   . Hypertension Sister   . Healthy Mother   . Healthy Father   . Stroke Brother   . Heart attack Paternal Aunt   . Breast cancer Neg Hx     Social History   Tobacco Use  . Smoking status: Former Smoker    Types: Cigarettes    Quit date: 05/22/1992    Years since quitting: 27.5  . Smokeless tobacco: Never Used  Vaping Use  . Vaping Use: Never used  Substance  Use Topics  . Alcohol use: No    Comment: quit 20 yrs ago  . Drug use: No    Home Medications Prior to Admission medications   Medication Sig Start Date End Date Taking? Authorizing Provider  albuterol (PROVENTIL HFA;VENTOLIN HFA) 108 (90 BASE) MCG/ACT inhaler Inhale 2 puffs into the lungs every 4 (four) hours as needed for wheezing or shortness of breath. For short    [provider]  ammonium lactate (AMLACTIN) 12 % lotion Apply 1 application topically as needed for dry skin. 06/14/19   Felipa Furnace, DPM  aspirin EC 81 MG tablet Take 81 mg by mouth daily.    [provider]  B-D UF III MINI PEN NEEDLES 31G X 5 MM MISC U 1 PEN NEEDLE QID WITH EACH INJECTION 08/01/18   [provider]  esomeprazole (NEXIUM) 40 MG capsule Take 40 mg by mouth daily.     [provider]  fluticasone (FLONASE) 50 MCG/ACT nasal spray Place 1 spray into both nostrils daily.     [provider]  HUMALOG KWIKPEN 100 UNIT/ML KiwkPen Inject 8-14 Units into the skin See admin instructions. Inject 8 units in morning, 12 units at lunch, and 16 units at dinner 01/24/17   [provider]  ibuprofen (ADVIL) 200 MG tablet Take by mouth.    [provider]  levothyroxine (SYNTHROID, LEVOTHROID) 150 MCG tablet Take 150 mcg by mouth daily before breakfast.    [provider]  Linaclotide (LINZESS) 145 MCG CAPS capsule Take 145 mcg by mouth daily as needed (constipation).     [provider]  methocarbamol (ROBAXIN) 500 MG tablet Take 1 tablet (500 mg total) by mouth every 12 (twelve) hours as needed for muscle spasms. 07/09/19   Antonietta Breach, PA-C  metolazone (ZAROXOLYN) 2.5 MG tablet Take 1 tablet (2.5 mg total) by mouth daily. 07/26/18   Erlene Quan, PA-C  ondansetron (ZOFRAN ODT) 4 MG disintegrating tablet Take 1 tablet (4 mg total) by mouth every 8 (eight) hours as needed for nausea or vomiting. 07/09/19   Antonietta Breach, PA-C  potassium chloride SA  (KLOR-CON) 20 MEQ tablet TAKE 1 TABLET BY MOUTH DAILY  10/11/19   Clegg, Amy D, NP  pregabalin (LYRICA) 150 MG capsule Take 150 mg by mouth 3 (three) times daily.  01/01/18   [provider]  simvastatin (ZOCOR) 20 MG tablet Take 20 mg by mouth every evening.     [provider]  spironolactone (ALDACTONE) 25 MG tablet TAKE 1/2 TABLET BY MOUTH DAILY 01/30/19   Bensimhon, Shaune Pascal, MD  torsemide (DEMADEX) 20 MG tablet TAKE 4 TABLETS (80 MG TOTAL) BY MOUTH DAILY.  06/18/19   Clegg, Amy D, NP  TRESIBA FLEXTOUCH 200 UNIT/ML SOPN Inject 60 Units into the skin at bedtime.  05/30/18   [provider]    Allergies    Penicillins, Vicodin [hydrocodone-acetaminophen], Codeine, Iodine-131, Iohexol, Lisinopril,  and Sulfa antibiotics  Review of Systems   Review of Systems  Constitutional:       Per HPI, otherwise negative  HENT:       Per HPI, otherwise negative  Eyes:       Patient is blind  Respiratory:       Per HPI, otherwise negative  Cardiovascular:       Per HPI, otherwise negative  Gastrointestinal: Negative for vomiting.  Endocrine:       Negative aside from HPI  Genitourinary:       Neg aside from HPI   Musculoskeletal:       Per HPI, otherwise negative  Skin: Negative.   Neurological: Negative for syncope.    Physical Exam Updated Vital Signs BP (!) 150/75 (BP Location: Right Arm)   Pulse 72   Temp 97.7 F (36.5 C) (Oral)   Resp 18   Ht 4\' 11"  (1.499 m)   Wt 100.2 kg   SpO2 97%   BMI 44.64 kg/m   Physical Exam Vitals and nursing note reviewed.  Constitutional:      General: She is not in acute distress.    Appearance: She is well-developed.  HENT:     Head: Normocephalic and atraumatic.  Eyes:     Conjunctiva/sclera: Conjunctivae normal.  Cardiovascular:     Rate and Rhythm: Normal rate and regular rhythm.  Pulmonary:     Effort: Pulmonary effort is normal. No respiratory distress.     Breath sounds: Normal breath sounds. No stridor.   Abdominal:     General: There is no distension.     Tenderness: There is abdominal tenderness in the right upper quadrant. There is right CVA tenderness.  Skin:    General: Skin is warm and dry.  Neurological:     Mental Status: She is alert and oriented to person, place, and time.     Comments: Patient is blind, otherwise cranial nerves unremarkable.     ED Results / Procedures / Treatments   Labs (all labs ordered are listed, but only abnormal results are displayed) Labs Reviewed  COMPREHENSIVE METABOLIC PANEL - Abnormal; Notable for the following components:      Result Value   Sodium 147 (*)    Glucose, Bld 241 (*)    BUN 56 (*)    Creatinine, Ser 1.78 (*)    Albumin 3.2 (*)    GFR calc non Af Amer 30 (*)    GFR calc Af Amer 35 (*)    All other components within normal limits  CBC WITH DIFFERENTIAL/PLATELET - Abnormal; Notable for the following components:   WBC 11.9 (*)    Hemoglobin 11.7 (*)    RDW 16.2 (*)    Neutro Abs 9.4 (*)    All other components within normal limits  LIPASE, BLOOD  URINALYSIS, ROUTINE W REFLEX MICROSCOPIC    EKG None  Radiology US Abdomen Complete  Result Date: 11/22/2019 CLINICAL DATA:  Right upper quadrant pain EXAM: ABDOMEN ULTRASOUND COMPLETE COMPARISON:  02/20/2008, CT 03/06/2018 FINDINGS: Gallbladder: Status post cholecystectomy. Common bile duct: Diameter: 4.1 mm Liver: Slightly enlarged at 18.7 cm. Diffusely echogenic liver. Portal vein is patent on color Doppler imaging with normal direction of blood flow towards the liver. IVC: No abnormality visualized. Pancreas: Visualized portion unremarkable. Spleen: Size and appearance within normal limits. Right Kidney: Length: 11.5 cm. Cortical echogenicity is normal. No hydronephrosis or mass. Diffuse cortical thinning. Left Kidney: Length: 10.5 cm. Cortical echogenicity is normal. No hydronephrosis or  mass. Areas of cortical thinning are present. Abdominal aorta: No aneurysm visualized. Distal  aorta is obscured by bowel gas. Other findings: None. IMPRESSION: 1. Status post cholecystectomy.  No biliary dilatation 2. Slightly enlarged liver. Liver is echogenic consistent with steatosis. 3. Renal cortical thinning bilaterally suggesting mild atrophy. Correlate with renal function tests. No hydronephrosis. Electronically Signed   By: Donavan Foil M.D.   On: 11/22/2019 21:45    Procedures Procedures (including critical care time)  Medications Ordered in ED Medications  fentaNYL (SUBLIMAZE) injection 25 mcg (25 mcg Intravenous Given 11/22/19 2214)    ED Course  I have reviewed the triage vital signs and the nursing notes.  Pertinent labs & imaging results that were available during my care of the patient were reviewed by me and considered in my medical decision making (see chart for details).  Elderly female presents with back pain, right upper quadrant and lateral abdominal pain. Patient has no other GI complaints, there is low suspicion for diverticulitis, hepatobiliary dysfunction, but given the positive Murphy sign, pain in the area, ultrasound was performed. This was reassuring, with no evidence for biliary dilatation, substantial findings, obvious kidney stone. Initial labs reassuring as well, with persistent demonstration of mild renal dysfunction, no substantial change. Patient has normal lipase, only mild abnormalities of electrolytes, no substantial leukocytosis.  Patient symptoms improved here with mild analgesia.  On signout the patient is awaiting urinalysis, repeat evaluation.  Dr. Stark Jock is aware of the patient.   Final Clinical Impression(s) / ED Diagnoses Final diagnoses:  Flank pain     Carmin Muskrat, MD 11/22/19 2324

## 2019-11-22 NOTE — Patient Outreach (Signed)
[facility]) Care Management  11/22/2019  Amy [name] 1960/11/10 [id number]  [name] spoke with pt on 11/21/2019 who complained about back pain- states she has consult on 12/13/2019 with a back pain specialist.  Pt has had one virtual counseling session and has another one 11/26/2019 at 3pm.  Pt feels this was helpful and is optimistic going forward it will be help even more.  Pt has her CAPS in home assistance; currently getting 6 hours 5 days weekly.  She is considering seeing if it can be changed to 3 hours in the AM and 3in the PM so she has less home alone time.  Pt has no interest in pursuing placement (ALF, SNF).    CSW offered support and encouragement.  Pt shared that she does not have a "diabetes machine" yet.  CSW will advise [facility] RN for possible assistance with this.  CSW will plan to touch base with pt again 2-3 weeks for further assessment of needs.   Eduard Clos, MSW, [place] Worker  [place] [phone]

## 2019-11-23 ENCOUNTER — Emergency Department (HOSPITAL_COMMUNITY): Payer: Medicare Other

## 2019-11-23 DIAGNOSIS — R109 Unspecified abdominal pain: Secondary | ICD-10-CM | POA: Diagnosis not present

## 2019-11-23 MED ORDER — FENTANYL CITRATE (PF) 100 MCG/2ML IJ SOLN
50.0000 ug | Freq: Once | INTRAMUSCULAR | Status: AC
  Administered 2019-11-23: 50 ug via INTRAVENOUS
  Filled 2019-11-23: qty 2

## 2019-11-23 MED ORDER — OXYCODONE-ACETAMINOPHEN 5-325 MG PO TABS: 2.0000 | ORAL_TABLET | Freq: Once | ORAL | Status: DC

## 2019-11-23 NOTE — Discharge Instructions (Addendum)
Continue medications as previously prescribed.  Follow-up with your primary doctor if symptoms or not improving in the next few days.

## 2019-11-23 NOTE — ED Provider Notes (Signed)
Patient care assumed from Dr. Vanita Panda at shift change.  Patient awaiting results of a urinalysis to evaluate for her right flank pain.  Urinalysis has returned and is negative for infection.  Patient with continued pain, so a CT of the abdomen and pelvis was also performed.  This showed no evidence for renal calculus or other pathology.  I suspect patient's pain is musculoskeletal in nature.  She will be discharged with as needed return.   Veryl Speak, MD 11/23/19 [phone]

## 2019-11-25 ENCOUNTER — Other Ambulatory Visit: Payer: Self-pay | Admitting: *Deleted

## 2019-11-25 NOTE — Patient Outreach (Signed)
[facility]) Care Management  11/25/2019  Amy [name] 1961/03/05 [id number]  Successful telephone outreach call to patient. HIPAA identifiers obtained. Nurse called patient today to follow-up after her ED visit on 11/22/19 due to back pain. Patient states her back pain is some better. It is tolerable when she is sitting and it increases up to pain level 8 when she stands or ambulates. Patient contacted PCP's office and per patient they prescribed pain medication and advised her to keep her upcoming appointment with her back specialist. Nurse also followed-up with patient about glucose meter for the blind. Nurse contacted PCP's office on 11/05/19 and was told by PCP's assistant that an order for the meter was sent to patient's pharmacy. Jaclyn Shaggy, [facility] Pharmacist who is also assisting with getting a meter for the patient states that [facility] received an order for glucose strips and not the meter. Jaclyn Shaggy has called PCP's office several times and left a message about the need of the meter. She has not received a call back and [facility] has not received an order for a glucose meter for the blind. Nurse called PCP's office today to inquire about the meter. The receptionist explained that the PCP was not in the office today and Lattie Haw was taking messages for Dr. Ardeth Perfect today. Nurse was not able to speak with Lattie Haw directly but did leave a detailed VM explaining that the patient needed a glucose meter for the blind and added that perhaps a  glucose strips prescription was sent last time nurse called instead of prescription for glucose meter. Nurse did leave [facility] number on the VM and stated that they are waiting for the order and that if they were to call [facility] they could guide them as to what to specifically order. Nurse also left her callback number for Lattie Haw to call for any further questions or concerns.   Plan: RN Health Coach will call patient within the next  several days to follow-up about the glucose meter order and patient agrees to future outreach calls.   [name] RN, BSN [place] [phone] Amy [name]

## 2019-11-28 ENCOUNTER — Other Ambulatory Visit: Payer: Self-pay | Admitting: *Deleted

## 2019-11-28 NOTE — Patient Outreach (Signed)
[facility]) Care Management  11/28/2019  NAZ DENUNZIO Dec 27, 1960 [id number]  Successful telephone outreach call to patient. HIPAA identifiers obtained. Nurse called patient to confirm that she received her audible glucose meter for the blind. Patient states that she does have the meter.   Plan: RN Health Coach will call patient within the month of August and patient agrees to future outreach calls.   [name] RN, BSN [place] [phone] [email or web address]

## 2019-11-29 ENCOUNTER — Other Ambulatory Visit (HOSPITAL_COMMUNITY): Payer: Self-pay | Admitting: Adult Health

## 2019-12-06 ENCOUNTER — Other Ambulatory Visit: Payer: Self-pay | Admitting: *Deleted

## 2019-12-07 NOTE — Patient Outreach (Signed)
[facility]) Care Management  12/07/2019  Amy [name] November 08, 1960 [id number]   [place] spoke with pt on 12/06/2019 who reports she is doing better, getting her counseling (virtual but plans for a face to face soon) and overall positive/denies depression.    Pt has had some recent family visits which she enjoyed. She also confirms she was able to get a CBG machine.   Pt plans to continue with her mental health support with counselor and denies any further assistance/needs from [facility] CSW.  CSW will sign off and advise PCP and [facility] team of above.   Eduard Clos, MSW, [place] Worker  [facility] [phone]

## 2019-12-27 ENCOUNTER — Encounter: Payer: Self-pay | Admitting: Podiatry

## 2019-12-27 ENCOUNTER — Other Ambulatory Visit: Payer: Self-pay

## 2019-12-27 ENCOUNTER — Encounter (HOSPITAL_COMMUNITY): Payer: Self-pay

## 2019-12-27 ENCOUNTER — Ambulatory Visit (INDEPENDENT_AMBULATORY_CARE_PROVIDER_SITE_OTHER): Payer: Medicare Other | Admitting: Podiatry

## 2019-12-27 ENCOUNTER — Ambulatory Visit (HOSPITAL_COMMUNITY)
Admission: EM | Admit: 2019-12-27 | Discharge: 2019-12-27 | Disposition: A | Discharge status: Home / Self Care | Payer: Medicare Other

## 2019-12-27 DIAGNOSIS — M79676 Pain in unspecified toe(s): Secondary | ICD-10-CM | POA: Diagnosis not present

## 2019-12-27 DIAGNOSIS — M7989 Other specified soft tissue disorders: Secondary | ICD-10-CM | POA: Diagnosis not present

## 2019-12-27 DIAGNOSIS — M25561 Pain in right knee: Secondary | ICD-10-CM

## 2019-12-27 DIAGNOSIS — B351 Tinea unguium: Secondary | ICD-10-CM | POA: Diagnosis not present

## 2019-12-27 DIAGNOSIS — E1142 Type 2 diabetes mellitus with diabetic polyneuropathy: Secondary | ICD-10-CM

## 2019-12-27 DIAGNOSIS — L84 Corns and callosities: Secondary | ICD-10-CM

## 2019-12-27 NOTE — Discharge Instructions (Addendum)
Continue the naproxen,  Slightly elevate your arm and ice your shoulder  Call your primary care for follow up on Monday  Call the sports medicine group on Monday for follow up  If swelling gets worse, you have chest pain or shortness of breath, go to the Emergency department

## 2019-12-27 NOTE — ED Triage Notes (Signed)
Pt presents with swelling and pin in the left arm and right knee x 4 days. Pt reports started taking naproxen and doxycycline 2 days ago,. prescribed by her PCP for complaints she is her for today. the  Denies fall.

## 2019-12-27 NOTE — ED Provider Notes (Signed)
[place]    CSN: [id number] Arrival date & time: 12/27/19  1731      History   Chief Complaint Chief Complaint  Patient presents with  . Arm Pain  . Knee Pain    HPI Amy [name] is a 59 y.o. female.   Patient presents for evaluation of left arm pain and swelling as well as right knee pain.  She reports about 5 days ago she had left elbow swelling.  She contacted her primary care Monday and was placed on naproxen and doxycycline.  She reports the elbow swelling is improved however she has noticed some swelling in the left forearm and left hand.  She reports the swelling was worse yesterday and is gone down since then somewhat.  She denies significant pain.  She does endorse it is little difficult to raise her arm above her head.  She denies fevers and chills.  No injuries.  No history of clots.  No chest pain or shortness of breath.     She also endorses some sharp pain in her right knee.  This been going on since yesterday.  Worse with immediately standing up.  Denies swelling or redness.  No injuries to it.     Past Medical History:  Diagnosis Date  . Arthritis   . Asthma   . Blind   . Blindness and low vision    left eye glass eye,  legally blind in right eye  . Diabetes mellitus   . Diabetic neuropathy ([place])   . Hyperlipidemia   . Hypertension   . Hypothyroidism   . Spinal stenosis     Patient Active Problem List   Diagnosis Date Noted  . Pre-operative cardiovascular examination 02/18/2019  . Septic bursitis of elbow, right 10/30/2018  . CRI (chronic renal insufficiency), stage 3 (moderate) 07/26/2018  . Morbid obesity ([place]) 07/26/2018  . Intractable nausea and vomiting 03/06/2018  . Right leg pain 12/19/2017  . Leukocytosis 12/19/2017  . Benign positional vertigo 12/19/2017  . Weakness 12/12/2017  . Laryngopharyngeal reflux (LPR) 10/19/2017  . Post-nasal drainage 10/19/2017  . Acute sinusitis 10/19/2017  . Degeneration of lumbar  intervertebral disc 07/29/2017  . 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 Obstructive sleep apnea on CPAP 02/09/2017  . Chronic diastolic heart failure ([place]) 11/08/2016  . Near syncope 01/30/2016  . SOB (shortness of breath)   . CAP (community acquired pneumonia) 09/12/2015  . Hypoxia 09/12/2015  . Essential hypertension 07/05/2015  . Hypotension 07/05/2015  . Diabetes mellitus with neurological manifestations ([place]) 11/04/2014  . Hyperlipidemia LDL goal <70 11/04/2014  . Spinal stenosis, multilevel 11/04/2014  . Hyperkalemia 11/01/2014  . Hypoglycemia 08/09/2014  . Type 2 diabetes mellitus with complication, with long-term current use of insulin ([place]) 08/08/2014  . Elevated troponin 08/08/2014  . Nausea vomiting and diarrhea 08/08/2014  . 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 Dizziness 08/06/2012  . Orthostatic hypotension 08/06/2012  . Hypernatremia 08/06/2012  . Acute gastroenteritis 08/13/2011  . Gastroparesis 08/13/2011  . Low back pain 08/13/2011  . Oral thrush 08/13/2011  . Gastroenteritis 05/23/2011  . Dehydration 05/23/2011  . Blindness 05/23/2011  . DM type 1, not at goal, causing eye disease ([place]) 05/23/2011  . Asthma 05/23/2011  . Diarrhea 05/23/2011  . Vomiting 05/23/2011  . Hypothyroidism 05/23/2011  . Diabetic neuropathy ([place]) 05/23/2011  . Diabetic nephropathy ([place])  05/23/2011    Past Surgical History:  Procedure Laterality Date  . ABDOMINAL HYSTERECTOMY  1990  . [place]   x 2  . CHOLECYSTECTOMY  2008  . ENUCLEATION Bilateral 09/15/1998  . EYE SURGERY  2016   fitting artificial eye    OB History   No obstetric history on file.    Obstetric Comments  Pt has two sons.         Home Medications    Prior to Admission medications   Medication Sig Start Date End Date Taking?  Authorizing Provider  DOXYCYCLINE HYCLATE PO Take by mouth.   Yes [provider]  naproxen (NAPROSYN) 500 MG tablet Take 500 mg by mouth 2 (two) times daily with a meal.   Yes [provider]  albuterol (PROVENTIL HFA;VENTOLIN HFA) 108 (90 BASE) MCG/ACT inhaler Inhale 2 puffs into the lungs every 4 (four) hours as needed for wheezing or shortness of breath. For short    [provider]  ammonium lactate (AMLACTIN) 12 % lotion Apply 1 application topically as needed for dry skin. Patient not taking: Reported on 11/23/2019 06/14/19   Felipa Furnace, DPM  aspirin EC 81 MG tablet Take 81 mg by mouth daily.    [provider]  B-D UF III MINI PEN NEEDLES 31G X 5 MM MISC U 1 PEN NEEDLE QID WITH EACH INJECTION 08/01/18   [provider]  esomeprazole (NEXIUM) 40 MG capsule Take 40 mg by mouth daily.     [provider]  furosemide (LASIX) 80 MG tablet Take 80 mg by mouth daily.    [provider]  HUMALOG KWIKPEN 100 UNIT/ML KiwkPen Inject 8-14 Units into the skin See admin instructions. Inject 8 units in morning, 12 units at lunch, and 14 units at dinner 01/24/17   [provider]  levothyroxine (SYNTHROID, LEVOTHROID) 150 MCG tablet Take 150 mcg by mouth daily before breakfast.    [provider]  Linaclotide (LINZESS) 145 MCG CAPS capsule Take 145 mcg by mouth daily as needed (constipation).     [provider]  methocarbamol (ROBAXIN) 500 MG tablet Take 1 tablet (500 mg total) by mouth every 12 (twelve) hours as needed for muscle spasms. Patient not taking: Reported on 11/23/2019 07/09/19   Antonietta Breach, PA-C  metolazone (ZAROXOLYN) 2.5 MG tablet Take 1 tablet (2.5 mg total) by mouth daily. 07/26/18   Erlene Quan, PA-C  ondansetron (ZOFRAN ODT) 4 MG disintegrating tablet Take 1 tablet (4 mg total) by mouth every 8 (eight) hours as needed for nausea or vomiting. 07/09/19   Antonietta Breach, PA-C  potassium chloride SA  (KLOR-CON) 20 MEQ tablet TAKE 1 TABLET BY MOUTH DAILY 12/02/19   Larey Dresser, MD  pregabalin (LYRICA) 150 MG capsule Take 150 mg by mouth 3 (three) times daily.  01/01/18   [provider]  simvastatin (ZOCOR) 20 MG tablet Take 20 mg by mouth every evening.     [provider]  spironolactone (ALDACTONE) 25 MG tablet TAKE 1/2 TABLET BY MOUTH DAILY Patient taking differently: Take 12.5 mg by mouth once.  01/30/19   Bensimhon, Shaune Pascal, MD  torsemide (DEMADEX) 20 MG tablet TAKE 4 TABLETS (80 MG TOTAL) BY MOUTH DAILY. 12/02/19   Larey Dresser, MD  TRESIBA FLEXTOUCH 200 UNIT/ML SOPN Inject 60 Units into the skin at bedtime.  05/30/18   [provider]    Family History Family History  Problem Relation Age of Onset  .  Diabetes Sister   . Glaucoma Sister   . Hypertension Sister   . Healthy Mother   . Healthy Father   . Stroke Brother   . Heart attack Paternal Aunt   . Breast cancer Neg Hx     Social History Social History   Tobacco Use  . Smoking status: Former Smoker    Types: Cigarettes    Quit date: 05/22/1992    Years since quitting: 27.6  . Smokeless tobacco: Never Used  Vaping Use  . Vaping Use: Never used  Substance Use Topics  . Alcohol use: No    Comment: quit 20 yrs ago  . Drug use: No     Allergies   Penicillins, Tramadol, Vicodin [hydrocodone-acetaminophen], Codeine, Iodine-131, Iohexol, Lisinopril, and Sulfa antibiotics   Review of Systems Review of Systems   Physical Exam Triage Vital Signs ED Triage Vitals  Enc Vitals Group     BP 12/27/19 1833 133/60     Pulse Rate 12/27/19 1833 68     Resp 12/27/19 1833 (!) 23     Temp 12/27/19 1833 97.9 F (36.6 C)     Temp Source 12/27/19 1833 Oral     SpO2 12/27/19 1833 100 %     Weight --      Height --      Head Circumference --      Peak Flow --      Pain Score 12/27/19 1831 8     Pain Loc --      Pain Edu? --      Excl. in [place]? --    No data found.  Updated Vital  Signs BP 133/60 (BP Location: Right Arm)   Pulse 68   Temp 97.9 F (36.6 C) (Oral)   Resp (!) 23   SpO2 100%   Visual Acuity Right Eye Distance:   Left Eye Distance:   Bilateral Distance:    Right Eye Near:   Left Eye Near:    Bilateral Near:     Physical Exam Vitals and nursing note reviewed.  Constitutional:      General: She is not in acute distress.    Appearance: She is well-developed.  HENT:     Head: Normocephalic and atraumatic.  Eyes:     Conjunctiva/sclera: Conjunctivae normal.  Cardiovascular:     Rate and Rhythm: Normal rate and regular rhythm.     Heart sounds: No murmur heard.   Pulmonary:     Effort: Pulmonary effort is normal. No respiratory distress.     Breath sounds: Normal breath sounds.  Abdominal:     Palpations: Abdomen is soft.     Tenderness: There is no abdominal tenderness.  Musculoskeletal:     Cervical back: Neck supple.     Comments: There is 1+ edema to the left forearm and hand.  Mild erythema on the dorsum of the left hand.  No significant tenderness.  Hand is not hot to touch.  Limb is overall warm, equal compared to right.  No olecranon swelling.  There is some tenderness in the anterior shoulder joint on the left side.  Good range of motion to above head.  Patient has good strength in the left arm.  Sensation is intact.  Radial pulse 2+.  Right knee without effusion, edema or swelling.  No redness.  Mild tenderness palpation of the anterior patella.  No pain with range of motion.  Skin:    General: Skin is warm and dry.  Neurological:  General: No focal deficit present.     Mental Status: She is alert and oriented to person, place, and time.      UC Treatments / Results  Labs (all labs ordered are listed, but only abnormal results are displayed) Labs Reviewed - No data to display  EKG   Radiology No results found.  Procedures Procedures (including critical care time)  Medications Ordered in UC Medications - No  data to display  Initial Impression / Assessment and Plan / UC Course  I have reviewed the triage vital signs and the nursing notes.  Pertinent labs & imaging results that were available during my care of the patient were reviewed by me and considered in my medical decision making (see chart for details).     #left arm swelling #Right knee pain Patient is a 60 year old presenting with swelling of left arm and acute right knee pain.  Suspect inflammatory as opposed to infectious with regard to the swelling of the left arm.  He is afebrile and well-appearing.  Given some improvement from yesterday, unlikely clot.  Patient is currently on naproxen will recommend continued usage.  Likely patellofemoral syndrome in the right knee.  Patient discussed and examined with attending physician Dr. Mannie Stabile, he agrees with the above observation.  We discussed with the patient strict emergency department precautions for any worsening swelling, development of chest pain or shortness of breath.  Instructed her to follow-up with her primary care and orthopedist on Monday.  Also gave the option of following up with the sports medicine group.   Final Clinical Impressions(s) / UC Diagnoses   Final diagnoses:  Left arm swelling  Acute pain of right knee     Discharge Instructions     Continue the naproxen,  Slightly elevate your arm and ice your shoulder  Call your primary care for follow up on Monday  Call the sports medicine group on Monday for follow up  If swelling gets worse, you have chest pain or shortness of breath, go to the Emergency department      ED Prescriptions    None     PDMP not reviewed this encounter.   Purnell Shoemaker, PA-C 12/27/19 2106

## 2019-12-27 NOTE — Patient Instructions (Addendum)
To Amy [name] and Caregivers:  Wear heel protectors daily when in bed to avoid woresening of pressure sore. Keep feet separated from each other where heels are not touching each other.   Apply Aquaphor Ointment to both feet once daily avoiding application between toes.  Pressure Injury  A pressure injury is damage to the skin and underlying tissue that results from pressure being applied to an area of the body. It often affects people who must spend a long time in a bed or chair because of a medical condition. Pressure injuries usually occur:  Over bony parts of the body, such as the tailbone, shoulders, elbows, hips, heels, spine, ankles, and back of the head.  Under medical devices that make contact with the body, such as respiratory equipment, stockings, tubes, and splints. Pressure injuries start as reddened areas on the skin and can lead to pain and an open wound. What are the causes? This condition is caused by frequent or constant pressure to an area of the body. Decreased blood flow to the skin can eventually cause the skin tissue to die and break down, causing a wound. What increases the risk? You are more likely to develop this condition if you:  Are in the hospital or an extended care facility.  Are bedridden or in a wheelchair.  Have an injury or disease that keeps you from: ? Moving normally. ? Feeling pain or pressure.  Have a condition that: ? Makes you sleepy or less alert. ? Causes poor blood flow.  Need to wear a medical device.  Have poor control of your bladder or bowel functions (incontinence).  Have poor nutrition (malnutrition). If you are at risk for pressure injuries, your health care provider may recommend certain types of mattresses, mattress covers, pillows, cushions, or boots to help prevent them. These may include products filled with air, foam, gel, or sand. What are the signs or symptoms? Symptoms of this condition depend on the severity of the  injury. Symptoms may include:  Red or dark areas of the skin.  Pain, warmth, or a change of skin texture.  Blisters.  An open wound. How is this diagnosed? This condition is diagnosed with a medical history and physical exam. You may also have tests, such as:  Blood tests.  Imaging tests.  Blood flow tests. Your pressure injury will be staged based on its severity. Staging is based on:  The depth of the tissue injury, including whether there is exposure of muscle, bone, or tendon.  The cause of the pressure injury. How is this treated? This condition may be treated by:  Relieving or redistributing pressure on your skin. This includes: ? Frequently changing your position. ? Avoiding positions that caused the wound or that can make the wound worse. ? Using specific bed mattresses, chair cushions, or protective boots. ? Moving medical devices from an area of pressure, or placing padding between the skin and the device. ? Using foams, creams, or powders to prevent rubbing (friction) on the skin.  Keeping your skin clean and dry. This may include using a skin cleanser or skin barrier as told by your health care provider.  Cleaning your injury and removing any dead tissue from the wound (debridement).  Placing a bandage (dressing) over your injury.  Using medicines for pain or to prevent or treat infection. Surgery may be needed if other treatments are not working or if your injury is very deep. Follow these instructions at home: Wound care  Follow instructions from  your health care provider about how to take care of your wound. Make sure you: ? Wash your hands with soap and water before and after you change your bandage (dressing). If soap and water are not available, use hand sanitizer. ? Change your dressing as told by your health care provider.  Check your wound every day for signs of infection. Have a caregiver do this for you if you are not able. Check for: ? Redness,  swelling, or increased pain. ? More fluid or blood. ? Warmth. ? Pus or a bad smell. Skin care  Keep your skin clean and dry. Gently pat your skin dry.  Do not rub or massage your skin.  You or a caregiver should check your skin every day for any changes in color or any new blisters or sores (ulcers). Medicines  Take over-the-counter and prescription medicines only as told by your health care provider.  If you were prescribed an antibiotic medicine, take or apply it as told by your health care provider. Do not stop using the antibiotic even if your condition improves. Reducing and redistributing pressure  Do not lie or sit in one position for a long time. Move or change position every 1-2 hours, or as told by your health care provider.  Use pillows or cushions to reduce pressure. Ask your health care provider to recommend cushions or pads for you. General instructions   Eat a healthy diet that includes lots of protein.  Drink enough fluid to keep your urine pale yellow.  Be as active as you can every day. Ask your health care provider to suggest safe exercises or activities.  Do not abuse drugs or alcohol.  Do not use any products that contain nicotine or tobacco, such as cigarettes, e-cigarettes, and chewing tobacco. If you need help quitting, ask your health care provider.  Keep all follow-up visits as told by your health care provider. This is important. Contact a health care provider if:  You have: ? A fever or chills. ? Pain that is not helped by medicine. ? Any changes in skin color. ? New blisters or sores. ? Pus or a bad smell coming from your wound. ? Redness, swelling, or pain around your wound. ? More fluid or blood coming from your wound.  Your wound does not improve after 1-2 weeks of treatment. Summary  A pressure injury is damage to the skin and underlying tissue that results from pressure being applied to an area of the body.  Do not lie or sit in one  position for a long time. Your health care provider may advise you to move or change position every 1-2 hours.  Follow instructions from your health care provider about how to take care of your wound.  Keep all follow-up visits as told by your health care provider. This is important. This information is not intended to replace advice given to you by your health care provider. Make sure you discuss any questions you have with your health care provider. Document Revised: 11/29/2017 Document Reviewed: 11/29/2017 Elsevier Patient Education  [employer].   Diabetes Mellitus and [place] care is an important part of your health, especially when you have diabetes. Diabetes may cause you to have problems because of poor blood flow (circulation) to your feet and legs, which can cause your skin to:  Become thinner and drier.  Break more easily.  Heal more slowly.  Peel and crack. You may also have nerve damage (neuropathy) in your  legs and feet, causing decreased feeling in them. This means that you may not notice minor injuries to your feet that could lead to more serious problems. Noticing and addressing any potential problems early is the best way to prevent future foot problems. How to care for your feet Foot hygiene  Wash your feet daily with warm water and mild soap. Do not use hot water. Then, pat your feet and the areas between your toes until they are completely dry. Do not soak your feet as this can dry your skin.  Trim your toenails straight across. Do not dig under them or around the cuticle. File the edges of your nails with an emery board or nail file.  Apply a moisturizing lotion or petroleum jelly to the skin on your feet and to dry, brittle toenails. Use lotion that does not contain alcohol and is unscented. Do not apply lotion between your toes. Shoes and socks  Wear clean socks or stockings every day. Make sure they are not too tight. Do not wear knee-high  stockings since they may decrease blood flow to your legs.  Wear shoes that fit properly and have enough cushioning. Always look in your shoes before you put them on to be sure there are no objects inside.  To break in new shoes, wear them for just a few hours a day. This prevents injuries on your feet. Wounds, scrapes, corns, and calluses  Check your feet daily for blisters, cuts, bruises, sores, and redness. If you cannot see the bottom of your feet, use a mirror or ask someone for help.  Do not cut corns or calluses or try to remove them with medicine.  If you find a minor scrape, cut, or break in the skin on your feet, keep it and the skin around it clean and dry. You may clean these areas with mild soap and water. Do not clean the area with peroxide, alcohol, or iodine.  If you have a wound, scrape, corn, or callus on your foot, look at it several times a day to make sure it is healing and not infected. Check for: ? Redness, swelling, or pain. ? Fluid or blood. ? Warmth. ? Pus or a bad smell. General instructions  Do not cross your legs. This may decrease blood flow to your feet.  Do not use heating pads or hot water bottles on your feet. They may burn your skin. If you have lost feeling in your feet or legs, you may not know this is happening until it is too late.  Protect your feet from hot and cold by wearing shoes, such as at the beach or on hot pavement.  Schedule a complete foot exam at least once a year (annually) or more often if you have foot problems. If you have foot problems, report any cuts, sores, or bruises to your health care provider immediately. Contact a health care provider if:  You have a medical condition that increases your risk of infection and you have any cuts, sores, or bruises on your feet.  You have an injury that is not healing.  You have redness on your legs or feet.  You feel burning or tingling in your legs or feet.  You have pain or cramps  in your legs and feet.  Your legs or feet are numb.  Your feet always feel cold.  You have pain around a toenail. Get help right away if:  You have a wound, scrape, corn, or callus on your  foot and: ? You have pain, swelling, or redness that gets worse. ? You have fluid or blood coming from the wound, scrape, corn, or callus. ? Your wound, scrape, corn, or callus feels warm to the touch. ? You have pus or a bad smell coming from the wound, scrape, corn, or callus. ? You have a fever. ? You have a red line going up your leg. Summary  Check your feet every day for cuts, sores, red spots, swelling, and blisters.  Moisturize feet and legs daily.  Wear shoes that fit properly and have enough cushioning.  If you have foot problems, report any cuts, sores, or bruises to your health care provider immediately.  Schedule a complete foot exam at least once a year (annually) or more often if you have foot problems. This information is not intended to replace advice given to you by your health care provider. Make sure you discuss any questions you have with your health care provider. Document Revised: 01/23/2019 Document Reviewed: 06/03/2016 Elsevier Patient Education  [place].

## 2019-12-28 NOTE — Progress Notes (Signed)
Subjective: Amy [name] presents today at risk foot care with history of diabetic neuropathy and painful mycotic nails b/l that are difficult to trim. Pain interferes with ambulation. Aggravating factors include wearing enclosed shoe gear. Pain is relieved with periodic professional debridement.  She has h/o neuropathy and is taking pregabalin for this.  She has h/o ulceration of right heel which has healed. She relates callus of this area. She did not purchase heel protectors as discussed on last visit.  Amy Hatchet, MD is patient's PCP.  She has new caregiver, Cecille Aver, who states she will be assistig Amy [name] and would like instructions on caring for her feet.  Past Medical History:  Diagnosis Date  . Arthritis   . Asthma   . Blind   . Blindness and low vision    left eye glass eye,  legally blind in right eye  . Diabetes mellitus   . Diabetic neuropathy ([place])   . Hyperlipidemia   . Hypertension   . Hypothyroidism   . Spinal stenosis      Current Outpatient Medications on File Prior to Visit  Medication Sig Dispense Refill  . albuterol (PROVENTIL HFA;VENTOLIN HFA) 108 (90 BASE) MCG/ACT inhaler Inhale 2 puffs into the lungs every 4 (four) hours as needed for wheezing or shortness of breath. For short    . ammonium lactate (AMLACTIN) 12 % lotion Apply 1 application topically as needed for dry skin. (Patient not taking: Reported on 11/23/2019) 400 g 0  . aspirin EC 81 MG tablet Take 81 mg by mouth daily.    . B-D UF III MINI PEN NEEDLES 31G X 5 MM MISC U 1 PEN NEEDLE QID WITH EACH INJECTION    . esomeprazole (NEXIUM) 40 MG capsule Take 40 mg by mouth daily.     . furosemide (LASIX) 80 MG tablet Take 80 mg by mouth daily.    Marland Kitchen HUMALOG KWIKPEN 100 UNIT/ML KiwkPen Inject 8-14 Units into the skin See admin instructions. Inject 8 units in morning, 12 units at lunch, and 14 units at dinner    . levothyroxine (SYNTHROID, LEVOTHROID) 150 MCG tablet Take 150 mcg by mouth  daily before breakfast.    . Linaclotide (LINZESS) 145 MCG CAPS capsule Take 145 mcg by mouth daily as needed (constipation).     . methocarbamol (ROBAXIN) 500 MG tablet Take 1 tablet (500 mg total) by mouth every 12 (twelve) hours as needed for muscle spasms. (Patient not taking: Reported on 11/23/2019) 15 tablet 0  . metolazone (ZAROXOLYN) 2.5 MG tablet Take 1 tablet (2.5 mg total) by mouth daily. 30 tablet 3  . ondansetron (ZOFRAN ODT) 4 MG disintegrating tablet Take 1 tablet (4 mg total) by mouth every 8 (eight) hours as needed for nausea or vomiting. 10 tablet 0  . potassium chloride SA (KLOR-CON) 20 MEQ tablet TAKE 1 TABLET BY MOUTH DAILY 30 tablet 1  . pregabalin (LYRICA) 150 MG capsule Take 150 mg by mouth 3 (three) times daily.     . simvastatin (ZOCOR) 20 MG tablet Take 20 mg by mouth every evening.     Marland Kitchen spironolactone (ALDACTONE) 25 MG tablet TAKE 1/2 TABLET BY MOUTH DAILY (Patient taking differently: Take 12.5 mg by mouth once. ) 315 tablet 0  . torsemide (DEMADEX) 20 MG tablet TAKE 4 TABLETS (80 MG TOTAL) BY MOUTH DAILY. 120 tablet 1  . TRESIBA FLEXTOUCH 200 UNIT/ML SOPN Inject 60 Units into the skin at bedtime.      No current facility-administered medications  on file prior to visit.     Allergies  Allergen Reactions  . Penicillins Hives and Swelling    Has patient had a PCN reaction causing immediate rash, facial/tongue/throat swelling, SOB or lightheadedness with hypotension: yes- face swelling Has patient had a PCN reaction causing severe rash involving mucus membranes or skin necrosis: no Has patient had a PCN reaction that required hospitalization unknown (childhood allergy) Has patient had a PCN reaction occurring within the last 10 years: no If all of the above answers are "NO", then may proceed with Cephalosporin use.   . Tramadol Nausea And Vomiting    Intense nausea  . Vicodin [Hydrocodone-Acetaminophen] Nausea And Vomiting  . Codeine Nausea Only  . Iodine-131  Hives  . Iohexol Hives    Pt developed itching and hives along with nasal congestion; needs 13 hour premeds for future studies, Onset Date: [id number]   . Lisinopril Cough  . Sulfa Antibiotics Nausea And Vomiting    Objective: Amy [name] is a pleasant 59 y.o. y.o. Patient Race: Black or African American [2]  female in NAD. AAO x 3.  There were no vitals filed for this visit.  Vascular Examination: Capillary refill time to digits immediate b/l. Palpable DP pulses b/l. Palpable PT pulses b/l. Pedal hair present b/l. Skin temperature gradient within normal limits b/l.   Dermatological Examination: Pedal skin with normal turgor, texture and tone bilaterally. No open wounds bilaterally. No interdigital macerations bilaterally. Toenails 1-5 b/l elongated, dystrophic, thickened, crumbly with subungual debris and tenderness to dorsal palpation. Hyperkeratotic lesion medial aspect of posterior heel with subdermal hemorrhage. No surrounding erythema, no edema, no warmth, and no flocculence when palpated. No signs/symptoms of infection noted.  Musculoskeletal: Normal muscle strength 5/5 to all lower extremity muscle groups bilaterally. No pain crepitus or joint limitation noted with ROM b/l. Hallux valgus with bunion deformity noted b/l. Hammertoes noted to the 2-5 bilaterally.  Neurological Examination: Protective sensation intact 5/5 intact bilaterally with 10g monofilament b/l. Vibratory sensation intact b/l. Proprioception intact bilaterally.  Assessment: 1. Pain due to onychomycosis of toenail   2. Pre-ulcerative calluses   3. Diabetic peripheral neuropathy associated with type 2 diabetes mellitus ([place])     Plan: -Examined patient. For heels, again I recommended b/l heel protectors to prevent pressure on heels when in bed. -Continue diabetic foot care principles. Literature dispensed on today.  -Toenails 1-5 b/l were debrided in length and girth with sterile nail nippers and dremel  without iatrogenic bleeding.  -Patient to continue soft, supportive shoe gear daily. -Patient to report any pedal injuries to medical professional immediately. -Preulcerative callus posteromedial right heel pared utilizing sterile scalpel without incident. Again, discussed need for pressure relief of feet when sleeping. Recommended heel pillows which we have in the office or she can purchase them from [employer]. She is to wear when in bed. Caregiver, Cecille Aver related understanding. -Patient/POA to call should there be question/concern in the interim.  Return in about 3 months (around 03/28/2020) for diabetic nail and callus trim.  Marzetta Board, DPM

## 2019-12-31 ENCOUNTER — Ambulatory Visit: Payer: Medicare Other | Attending: Neurosurgery | Admitting: Physical Therapy

## 2019-12-31 ENCOUNTER — Encounter: Payer: Self-pay | Admitting: Physical Therapy

## 2019-12-31 ENCOUNTER — Other Ambulatory Visit: Payer: Self-pay

## 2019-12-31 DIAGNOSIS — M5441 Lumbago with sciatica, right side: Secondary | ICD-10-CM | POA: Insufficient documentation

## 2019-12-31 DIAGNOSIS — G8929 Other chronic pain: Secondary | ICD-10-CM

## 2019-12-31 DIAGNOSIS — R262 Difficulty in walking, not elsewhere classified: Secondary | ICD-10-CM | POA: Diagnosis present

## 2019-12-31 DIAGNOSIS — M6281 Muscle weakness (generalized): Secondary | ICD-10-CM | POA: Insufficient documentation

## 2019-12-31 NOTE — Therapy (Signed)
[place], [place], [address] Phone: [phone]   Fax:  [phone]  Physical Therapy Evaluation  Patient Details  Name: Amy [name] MRN: [id number] Date of Birth: [birth year] Referring Provider (PT): Leonie Green, NP   Encounter Date: 12/31/2019   PT End of Session - 12/31/19 1120    Visit Number 1    Number of Visits 12    Date for PT Re-Evaluation 02/11/20    Authorization Type [employer] MCR/MCD, recheck FOTO status by visit 6, progress note by visit 10    PT Start Time 1014    PT Stop Time 1102    PT Time Calculation (min) 48 min    Activity Tolerance Patient limited by pain    Behavior During Therapy [facility] for tasks assessed/performed           Past Medical History:  Diagnosis Date  . Arthritis   . Asthma   . Blind   . Blindness and low vision    left eye glass eye,  legally blind in right eye  . Diabetes mellitus   . Diabetic neuropathy ([place])   . Hyperlipidemia   . Hypertension   . Hypothyroidism   . Spinal stenosis     Past Surgical History:  Procedure Laterality Date  . ABDOMINAL HYSTERECTOMY  1990  . [place]   x 2  . CHOLECYSTECTOMY  2008  . ENUCLEATION Bilateral 09/15/1998  . EYE SURGERY  2016   fitting artificial eye    There were no vitals filed for this visit.    Subjective Assessment - 12/31/19 1106    Subjective Pt. is a 59 y/o female referred to PT with c/o lumbar and RLE radiating pain. She reports onset associated with increased sedentary behavior associated with the Covid pandemic beginning around March of last year. Symptoms include LBP described as "sharp" in nature with radiating pain distally down RLE to foot. Pain is worse in particular with lying on right side with pt. noting some issues with left arm pain from tendency for left sidelying position. Pain/symptoms also worse with sitting particularly in slouched position as well as standing. She  does report some pain with walking but no significant symptom exacerbation with prolonged walking.    Patient is accompained by: --   personal aide   Pertinent History visually impaired/blind, CHF, diabetic, OSA on CPAP, spinal stenosis    Limitations House hold activities;Lifting;Sitting;Standing;Walking    Diagnostic tests CT for abdominal region in July but no recent lumbar imaging    Patient Stated Goals Get back/leg better    Currently in Pain? Yes    Pain Score 8     Pain Location Back    Pain Orientation Right    Pain Descriptors / Indicators Sharp    Pain Type Chronic pain    Pain Radiating Towards RLE distal to foot    Pain Onset More than a month ago    Pain Frequency Intermittent    Aggravating Factors  lying on right side, sitting with slouched posture (better with pillow for lumbar support), standing    Pain Relieving Factors avoidance exacerbating positions otherwise no significant eases    Effect of Pain on Daily Activities limits positional tolerance, causes sleep disturbance              [facility] PT Assessment - 12/31/19 0001      Assessment   Medical Diagnosis Other intervertebral disc degeneration, lumbar region  Referring Provider (PT) Leonie Green, NP    Onset Date/Surgical Date --   07/15/18 estimated per pt. subjective report   Hand Dominance Right    Prior Therapy none for back, reports past PT a few years ago for legs and arm      Precautions   Precautions Fall      Restrictions   Weight Bearing Restrictions No      Balance Screen   Has the patient fallen in the past 6 months No      [place] residence    Living Arrangements Alone   has assistance from aide 6 hours/day   Type of [place] Access Level entry    [place] - 4 wheels;Shower seat      Prior Function   Level of Independence Independent with community mobility with device;Needs assistance with ADLs;Needs assistance with  homemaking    Comments mod I for gait with rollator-reports has used approximately 2 years, pt. reports can dress independently but needs assistance for bathing as well as IADLs from aide      Cognition   Overall Cognitive Status Within Functional Limits for tasks assessed      Observation/Other Assessments   Focus on Therapeutic Outcomes (FOTO)  69% limited      Sensation   Light Touch Appears Intact      ROM / Strength   AROM / PROM / Strength AROM;Strength      AROM   Overall AROM Comments limited ability to assess standing trunk AROM due to balance/need AD to stand, hip AROM/PROM grossly WFL for bed mobility and transfers    AROM Assessment Site Hip;Lumbar    Right/Left Hip --    Lumbar Flexion 30   limited ability due to balance>LBP   Lumbar Extension 10   increased local LBP, no peripheralization of RLE pain   Lumbar - Right Side Bend 20    Lumbar - Left Side Bend 15    Lumbar - Right Rotation 20    Lumbar - Left Rotation 10      Strength   Overall Strength Comments Bilateral LE strength grossly 4/5    Strength Assessment Site --    Right/Left Hip --    Right/Left Knee --    Right/Left Ankle --      Flexibility   Soft Tissue Assessment /Muscle Length --   piriformis tightness on right>left, see special tests     Special Tests   Other special tests (+) SLR on right at 30 deg, left SLR 70 deg with hamstring tightness      Bed Mobility   Bed Mobility --   min assist sit<>supine, mod I for transfers, bed mobility                     Objective measurements completed on examination: See above findings.       [place] Adult PT Treatment/Exercise - 12/31/19 0001      Exercises   Exercises --   reviewed HEP handout with aide                 PT Education - 12/31/19 1120    Education Details POC, potential symptom etiology, HEP, FOTO patient report    Person(s) Educated Patient;Caregiver(s)    Methods Explanation;Verbal cues;Handout     Comprehension Verbalized understanding               PT Long  Term Goals - 12/31/19 1129      PT LONG TERM GOAL #1   Title Independent with HEP    Baseline needs HEP    Time 6    Period Weeks    Status New    Target Date 02/11/20      PT LONG TERM GOAL #2   Title Improve FOTO outcome measure socre to 49% or less impairment    Baseline 69% limited    Time 6    Period Weeks    Status New    Target Date 02/11/20      PT LONG TERM GOAL #3   Title Tolerate right sidelying position for sleep with sleep disturbance symptoms/pain decreased 50% or greater from current status    Baseline difficulty tolerating    Time 6    Period Weeks    Status New    Target Date 02/11/20      PT LONG TERM GOAL #4   Title Tolerate sitting at least 30 min for use of transportation and sitting to eat meals with pain <5/10    Baseline 8/10 pain    Time 6    Period Weeks    Status New    Target Date 02/11/20      PT LONG TERM GOAL #5   Title Increase trunk flexion AROM at least 20-30 deg to improve ability for dressing, donning shoes    Baseline 30 deg with use rollator for UE support    Time 6    Period Weeks    Status New    Target Date 02/11/20                  Plan - 12/31/19 1121    Clinical Impression Statement Pt. presents with chronic LBP with RLE radiating pain consistent with likely radicular etiology. Per chart notes pt. has history of lumbar stenosis but given factors including pain worse with lying down and sitting and no significant pain exacerbation with prolonged walking would suspect potential discogenic etiology (which could cause foraminal narrowing/stenosis). Limited ability to assess directional preference with standing trunk AROM due to balance issues/need AD and pt. unable to tolerate prone positioning for assessment of response prone on elbows due to body habitus. Plan trial PT to help relieve pain and address associated functional limitations.    Personal  Factors and Comorbidities Comorbidity 3+    Comorbidities blindness, balance issues, diabetic, CHF    Examination-Activity Limitations Bend;Lift;Squat;Sleep;Dressing;Bathing;Sit;Hygiene/Grooming;Bed Mobility;Locomotion Level;Carry;Stand    Examination-Participation Restrictions Shop;Community Activity;Cleaning;Meal Prep    Stability/Clinical Decision Making Evolving/Moderate complexity    Clinical Decision Making Moderate    Rehab Potential Fair    PT Frequency 2x / week    PT Duration 6 weeks    PT Treatment/Interventions ADLs/Self Care Home Management;Cryotherapy;Electrical Stimulation;Moist Heat;Functional mobility training;Neuromuscular re-education;Therapeutic exercise;Patient/family education;Manual techniques;Dry needling;Therapeutic activities;Gait training    PT Next Visit Plan Check response extension in standing (in front of bed with caregiver assist) for centralization of RLE symptoms, trial LAD right hip, pelvic titls, LTR left side emphasis, stretch piriformis, modalities prn    PT Home Exercise Plan Access code: [email or web address]    Consulted and Agree with Plan of Care Patient           Patient will benefit from skilled therapeutic intervention in order to improve the following deficits and impairments:  Pain, Impaired flexibility, Decreased strength, Decreased activity tolerance, Decreased range of motion, Decreased balance, Difficulty walking  Visit Diagnosis: Chronic right-sided low back pain with  right-sided sciatica  Muscle weakness (generalized)  Difficulty in walking, not elsewhere classified     Problem List Patient Active Problem List   Diagnosis Date Noted  . Pre-operative cardiovascular examination 02/18/2019  . Septic bursitis of elbow, right 10/30/2018  . CRI (chronic renal insufficiency), stage 3 (moderate) 07/26/2018  . Morbid obesity ([place]) 07/26/2018  . Intractable nausea and vomiting 03/06/2018  . Right leg pain 12/19/2017  . Leukocytosis 12/19/2017  .  Benign positional vertigo 12/19/2017  . Weakness 12/12/2017  . Laryngopharyngeal reflux (LPR) 10/19/2017  . Post-nasal drainage 10/19/2017  . Acute sinusitis 10/19/2017  . Degeneration of lumbar intervertebral disc 07/29/2017  . 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 Obstructive sleep apnea on CPAP 02/09/2017  . Chronic diastolic heart failure ([place]) 11/08/2016  . Near syncope 01/30/2016  . SOB (shortness of breath)   . CAP (community acquired pneumonia) 09/12/2015  . Hypoxia 09/12/2015  . Essential hypertension 07/05/2015  . Hypotension 07/05/2015  . Diabetes mellitus with neurological manifestations ([place]) 11/04/2014  . Hyperlipidemia LDL goal <70 11/04/2014  . Spinal stenosis, multilevel 11/04/2014  . Hyperkalemia 11/01/2014  . Hypoglycemia 08/09/2014  . Type 2 diabetes mellitus with complication, with long-term current use of insulin ([name]) 08/08/2014  . Elevated troponin 08/08/2014  . Nausea vomiting and diarrhea 08/08/2014  . 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 Dizziness 08/06/2012  . Orthostatic hypotension 08/06/2012  . Hypernatremia 08/06/2012  . Acute gastroenteritis 08/13/2011  . Gastroparesis 08/13/2011  . Low back pain 08/13/2011  . Oral thrush 08/13/2011  . Gastroenteritis 05/23/2011  . Dehydration 05/23/2011  . Blindness 05/23/2011  . DM type 1, not at goal, causing eye disease ([place]) 05/23/2011  . Asthma 05/23/2011  . Diarrhea 05/23/2011  . Vomiting 05/23/2011  . Hypothyroidism 05/23/2011  . Diabetic neuropathy ([place]) 05/23/2011  . Diabetic nephropathy ([place]) 05/23/2011    Beaulah Dinning, PT, DPT 12/31/19 11:34 AM  [facility] [address] [place], [place], [address] Phone: [phone]   Fax:  [phone]  Name: Amy [name] MRN: [id number] Date of  Birth: 12-10-1960

## 2020-01-02 IMAGING — DX DG FOOT COMPLETE 3+V*R*
3 series · 3 of 3 positions shown · non-contrast
Comparison: None

CLINICAL DATA: RIGHT foot pain at great toe, heel pain, redness and
swelling great toe, no known injury

EXAM:
RIGHT FOOT COMPLETE - 3+ VIEW

[foot ap]
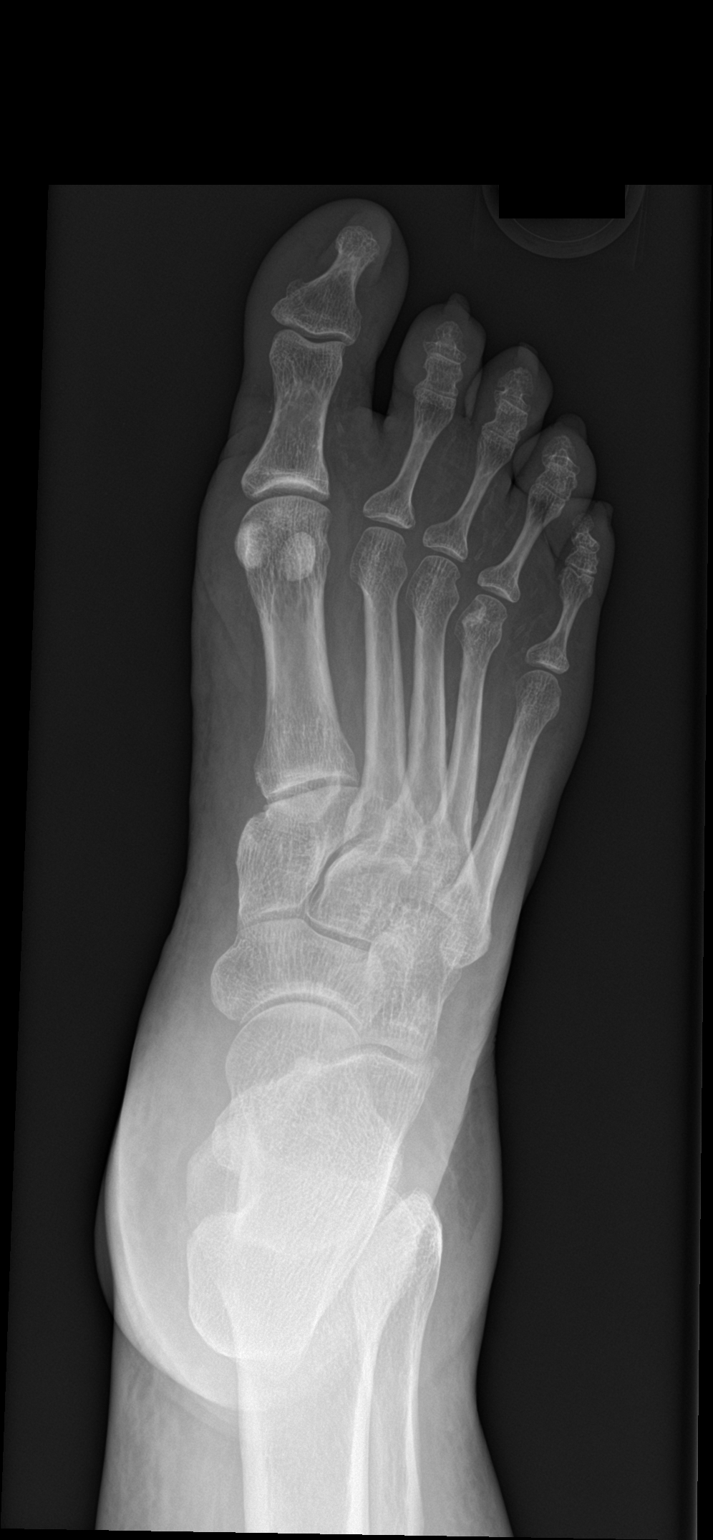

[foot obl]
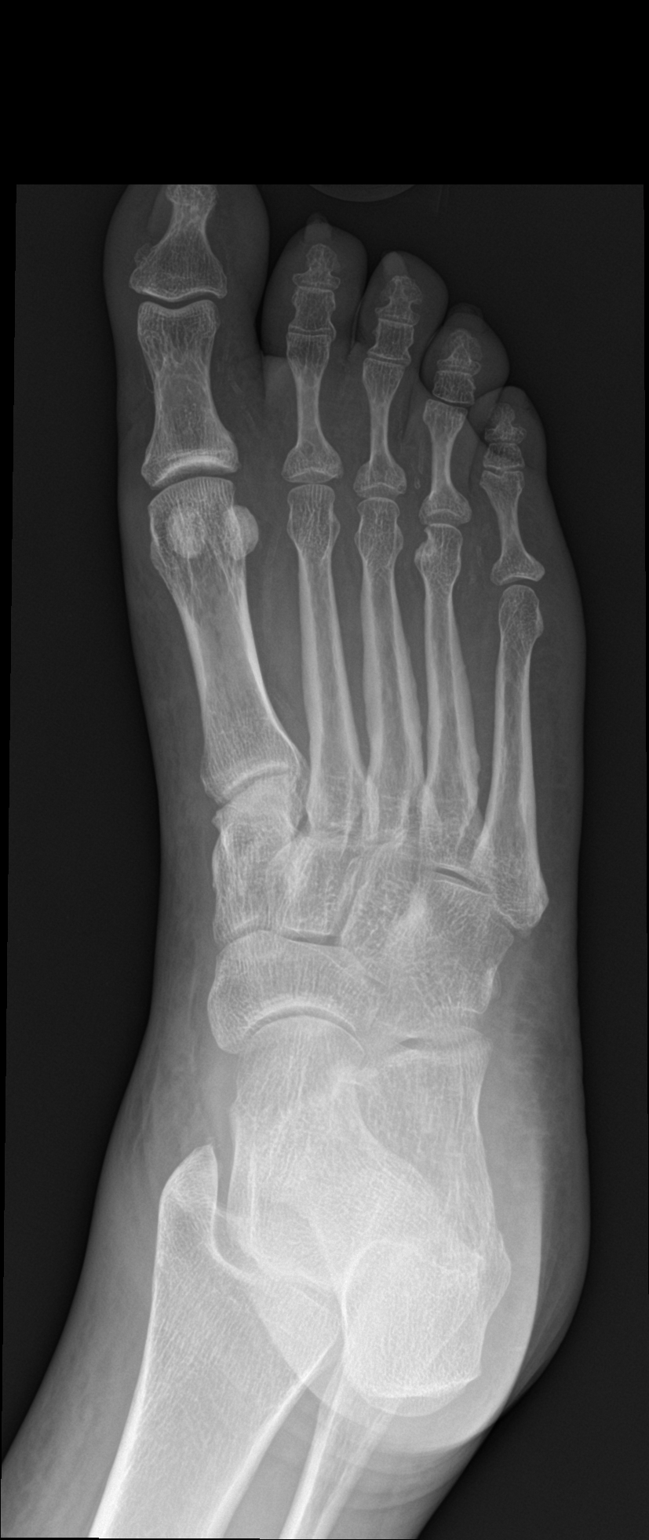

[foot lat]
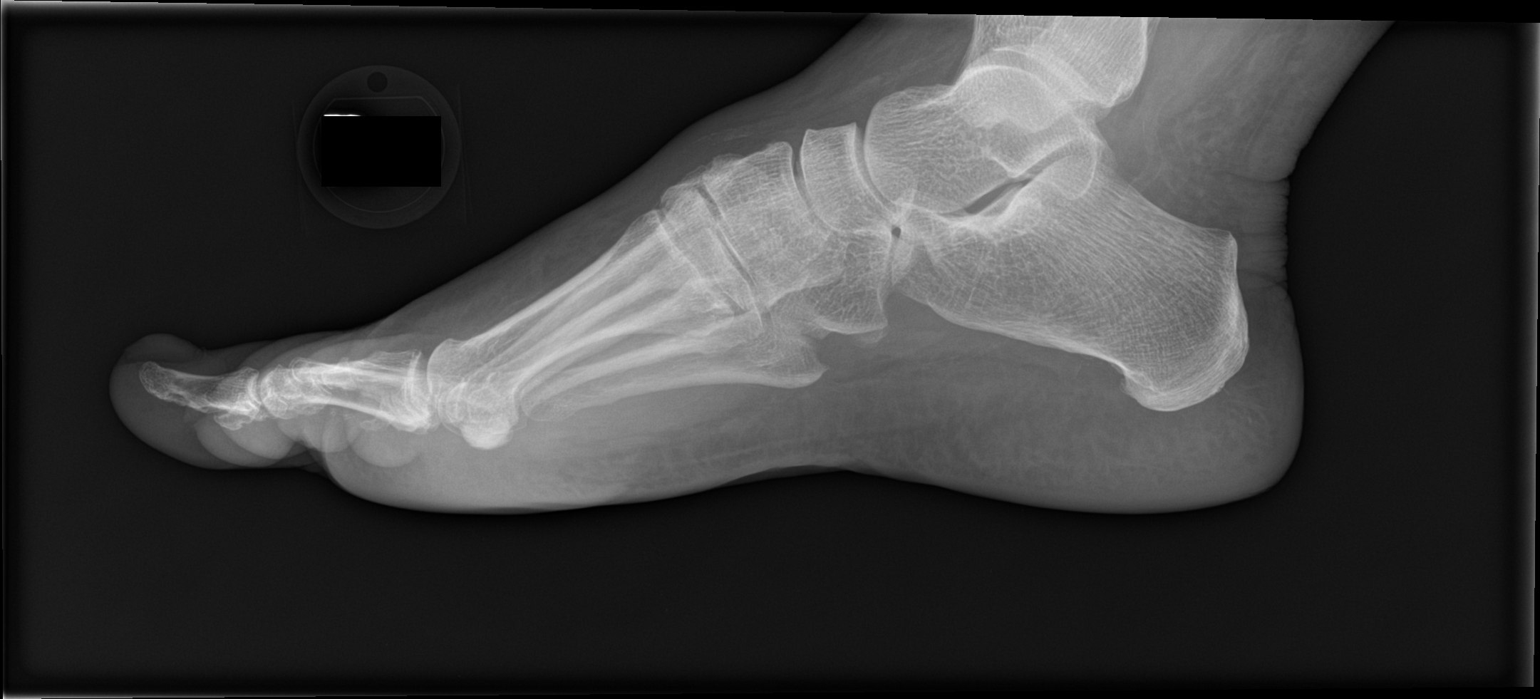

[3 of 3 positions shown; findings below may reference images not displayed]

FINDINGS: Osseous mineralization normal.

Joint spaces preserved.

No acute fracture, dislocation, or bone destruction.

Scattered soft tissue swelling at dorsum of RIGHT foot.

No erosive or inflammatory osseous changes.
IMPRESSION: No acute osseous abnormalities.

## 2020-01-07 ENCOUNTER — Other Ambulatory Visit: Payer: Self-pay | Admitting: *Deleted

## 2020-01-07 NOTE — Patient Outreach (Signed)
[name]) Care Management  [place]  01/07/2020   Amy [name] [birth year] [id number]  Subjective: Successful telephone outreach call to patient. HIPAA identifiers obtained. Patient reports that she is doing well. Patient states she has the audible glucose meter and is using it daily. Her FBS ranges are 124-150 and her before meal blood sugars are typically 140-160 but can be >200 depending on what she eats. Patient reporting that she could do better with following her diabetic diet. Patient states that she would like to lose 40 pounds and she knows she needs to increase her physical activity for her overall health. She has started outpatient PT to help with the lower back and right sided sciatica pain. Patient is participating with virtual counseling sessions and feels this is going very well, denies any depression today. Patient reports that she has not had any fall and does not have any other concerns today. She did confirm that she has this nurse's contact number to call if needed.   Encounter Medications:  Outpatient Encounter Medications as of 01/07/2020  Medication Sig Note  . albuterol (PROVENTIL HFA;VENTOLIN HFA) 108 (90 BASE) MCG/ACT inhaler Inhale 2 puffs into the lungs every 4 (four) hours as needed for wheezing or shortness of breath. For short   . ammonium lactate (AMLACTIN) 12 % lotion Apply 1 application topically as needed for dry skin. (Patient not taking: Reported on 11/23/2019)   . aspirin EC 81 MG tablet Take 81 mg by mouth daily.   . B-D UF III MINI PEN NEEDLES 31G X 5 MM MISC U 1 PEN NEEDLE QID WITH EACH INJECTION   . DOXYCYCLINE HYCLATE PO Take by mouth.   . esomeprazole (NEXIUM) 40 MG capsule Take 40 mg by mouth daily.    . furosemide (LASIX) 80 MG tablet Take 80 mg by mouth daily.   Marland Kitchen HUMALOG KWIKPEN 100 UNIT/ML KiwkPen Inject 8-14 Units into the skin See admin instructions. Inject 8 units in morning, 12 units at lunch, and 14 units at dinner    . levothyroxine (SYNTHROID, LEVOTHROID) 150 MCG tablet Take 150 mcg by mouth daily before breakfast.   . Linaclotide (LINZESS) 145 MCG CAPS capsule Take 145 mcg by mouth daily as needed (constipation).    . methocarbamol (ROBAXIN) 500 MG tablet Take 1 tablet (500 mg total) by mouth every 12 (twelve) hours as needed for muscle spasms. (Patient not taking: Reported on 11/23/2019) 10/02/2019: Completed medication  . metolazone (ZAROXOLYN) 2.5 MG tablet Take 1 tablet (2.5 mg total) by mouth daily.   . naproxen (NAPROSYN) 500 MG tablet Take 500 mg by mouth 2 (two) times daily with a meal.   . ondansetron (ZOFRAN ODT) 4 MG disintegrating tablet Take 1 tablet (4 mg total) by mouth every 8 (eight) hours as needed for nausea or vomiting. 11/23/2019: Needs refill  . potassium chloride SA (KLOR-CON) 20 MEQ tablet TAKE 1 TABLET BY MOUTH DAILY   . pregabalin (LYRICA) 150 MG capsule Take 150 mg by mouth 3 (three) times daily.    . simvastatin (ZOCOR) 20 MG tablet Take 20 mg by mouth every evening.    Marland Kitchen spironolactone (ALDACTONE) 25 MG tablet TAKE 1/2 TABLET BY MOUTH DAILY (Patient taking differently: Take 12.5 mg by mouth once. )   . torsemide (DEMADEX) 20 MG tablet TAKE 4 TABLETS (80 MG TOTAL) BY MOUTH DAILY.   Marland Kitchen TRESIBA FLEXTOUCH 200 UNIT/ML SOPN Inject 60 Units into the skin at bedtime.     No facility-administered encounter  medications on file as of 01/07/2020.    Functional Status:  In your present state of health, do you have any difficulty performing the following activities: 10/02/2019 05/29/2019  Hearing? Y N  Comment has hearing aids -  Vision? 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 N N  Walking or climbing stairs? Y Y  Comment per patient due to pain in right leg and hips blind  Dressing or bathing? Y Y  Comment patient has aide daily to assist due to blindness and disabilities blind  Doing errands, shopping? Y Y  Comment yes,  patient can cognitively go by herself but need transportation due to blindness. Patient's aide goes to the grocery store blind  Preparing Food and eating ? Y Y  Comment aide does cooking has aide  Using the Toilet? Y N  Comment patient has a hard time wiping herself due to obesity -  In the past six months, have you accidently leaked urine? Y N  Comment takes many diuretics -  Do you have problems with loss of bowel control? N N  Managing your Medications? [name]  Comment gets assistance to manage pill box managed by nurse  Managing your Finances? N N  Housekeeping or managing your Housekeeping? Y Y  Comment has aide to assist blind, has aide  Some recent data might be hidden    Fall/Depression Screening: Fall Risk  01/07/2020 11/05/2019 10/01/2019  Falls in the past year? 0 0 0  Number falls in past yr: 0 0 0  Injury with Fall? 0 0 0  Risk for fall due to : Impaired vision;Impaired mobility;Impaired balance/gait Impaired vision Impaired vision  Follow up Falls prevention discussed;Education provided;Falls evaluation completed Falls prevention discussed;Education provided;Falls evaluation completed Falls prevention discussed;Education provided;Falls evaluation completed   PHQ 2/9 Scores 10/01/2019 05/29/2019  PHQ - 2 Score 3 1  PHQ- 9 Score 5 -   Goals Addressed            This Visit's Progress   . Patient Stated      . COMPLETED: Patient will report a decreased A1C of 0.5-1 within the next 90 days       [place] (see longtitudinal plan of care for additional care plan information)  Objective:  Lab Results  Component Value Date   HGBA1C 10.7 (H) 11/02/2018 .   Lab Results  Component Value Date   CREATININE 1.78 (H) 11/22/2019   CREATININE 1.62 (H) 07/11/2019   CREATININE 1.55 (H) 02/18/2019 .   Marland Kitchen No results found for: EGFR  Current Barriers:  Marland Kitchen Knowledge Deficits related to basic Diabetes pathophysiology and self care/management  Case Manager Clinical Goal(s):   Over the next 90 days, patient will demonstrate improved adherence to prescribed treatment plan for diabetes self care/management as evidenced by:  Marland Kitchen Verbalize daily monitoring and recording of CBG within 90 days . Verbalize adherence to ADA/ carb modified diet within the next 90 days . Verbalize exercise 1-2 days/week . Verbalize adherence to prescribed medication regimen within the next 90 days   Interventions:  . Reviewed medications with patient and discussed importance of medication adherence . Provided patient with written educational materials related to hypo and hyperglycemia and importance of correct treatment . Advised patient, providing education and rationale, to check cbg daily and record, calling doctor for findings outside established parameters.   . Encouraged patient to increase physical activity, discussed patient's weight loss goal, nurse  will send EMMI chair exercise printouts, weight loss diet, weight loss tips  Patient Self Care Activities:  . Self administers oral medications as prescribed . Self administers insulin as prescribed . Self administers injectable DM medication Tyler Aas) as prescribed . Checks blood sugars as prescribed and utilize hyper and hypoglycemia protocol as needed  Initial goal documentation        Plan: RN Health Coach will send PCP today's assessment note, will send patient Advanced Directives, EMMI weight loss education and chair exercises, will call patient within the month of October and patient agrees to future outreach calls.   [name] RN, BSN [place] [phone] [email or web address]

## 2020-01-10 IMAGING — CR DG ANKLE COMPLETE 3+V*R*
3 series · 3 of 3 positions shown · non-contrast
Comparison: 11/29/2017

CLINICAL DATA: Pt arrived from home where she fell while walking
with her medical assistant. Pt states she fell backward after losing
her balance, striking the back of her head. Pt c/o posterior neck
pain as well as right ankle pain and swelling. Swelling and pain to
the dorsal side of right ankle/ foot and around toes. Pt is a type 2
diabetic. Unknown if pt is taking blood thinners

EXAM:
RIGHT ANKLE - COMPLETE 3+ VIEW

[ankle ap]
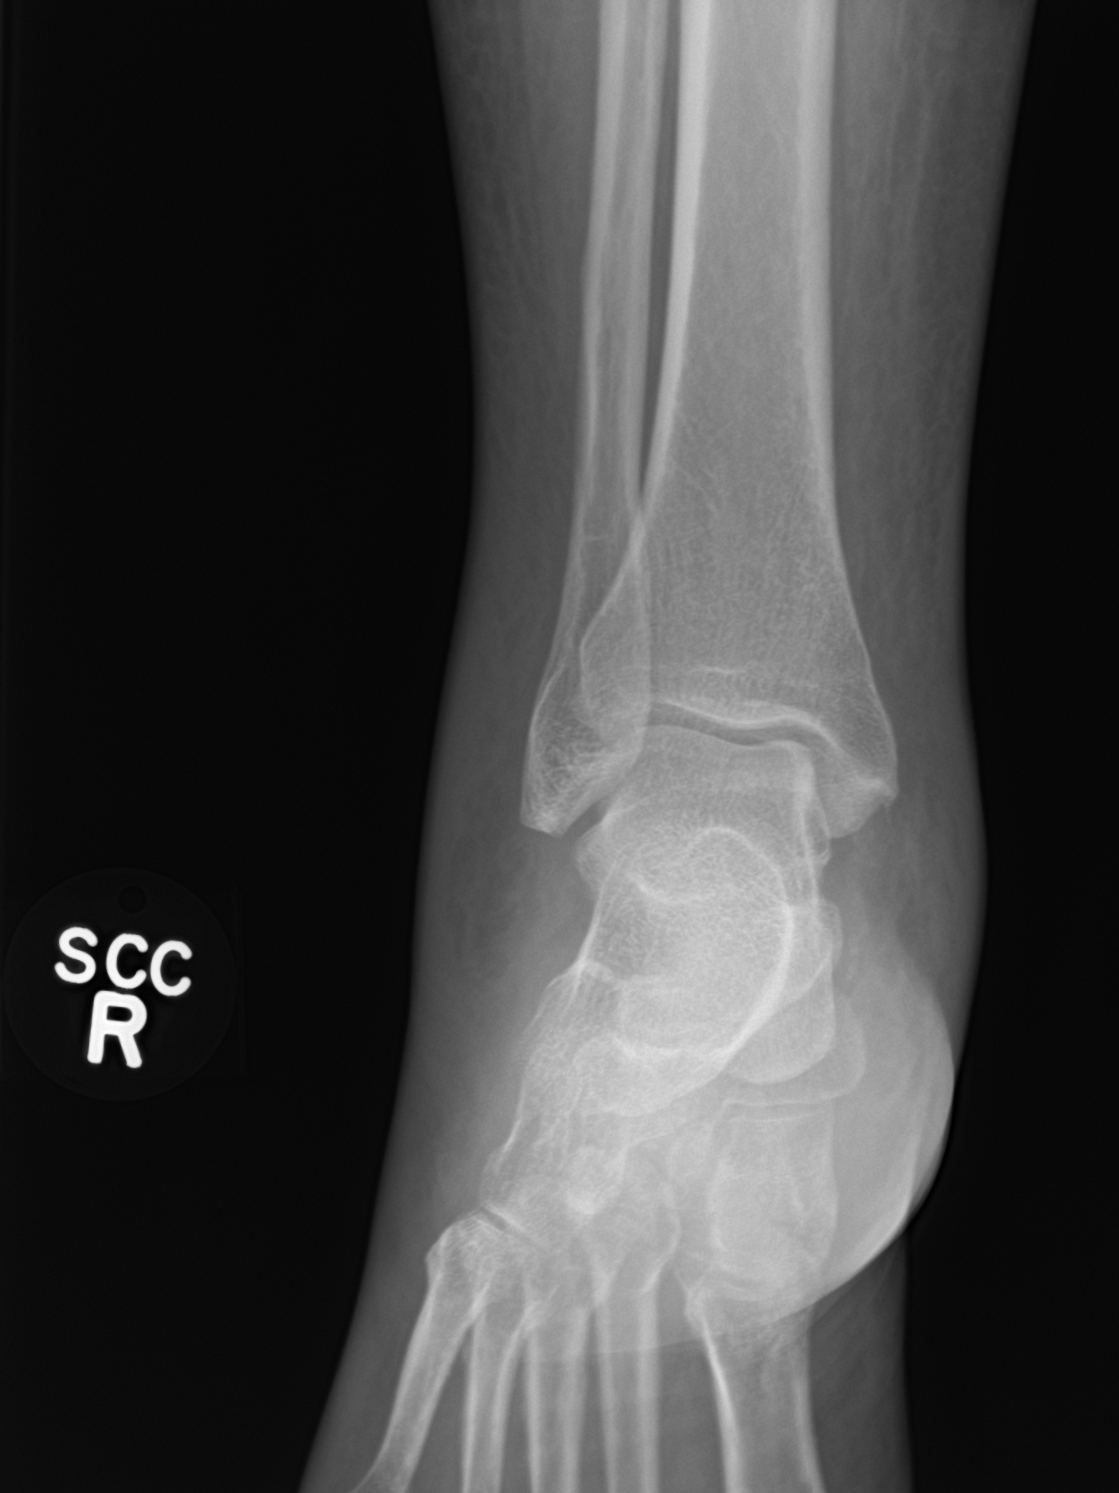

[ankle obl]
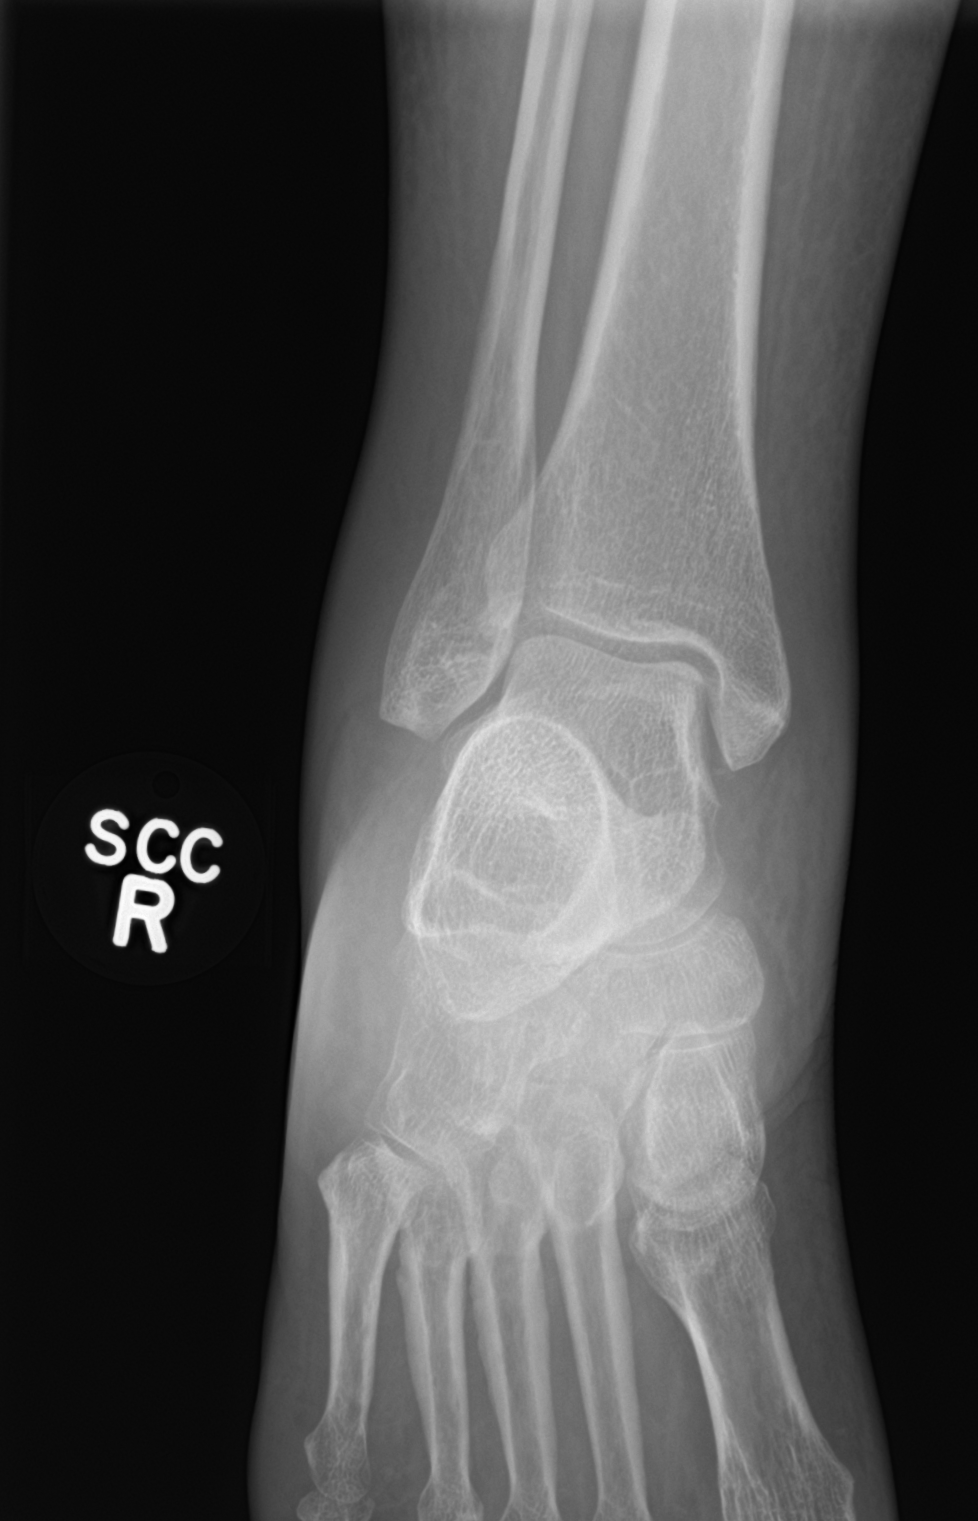

[ankle lat]
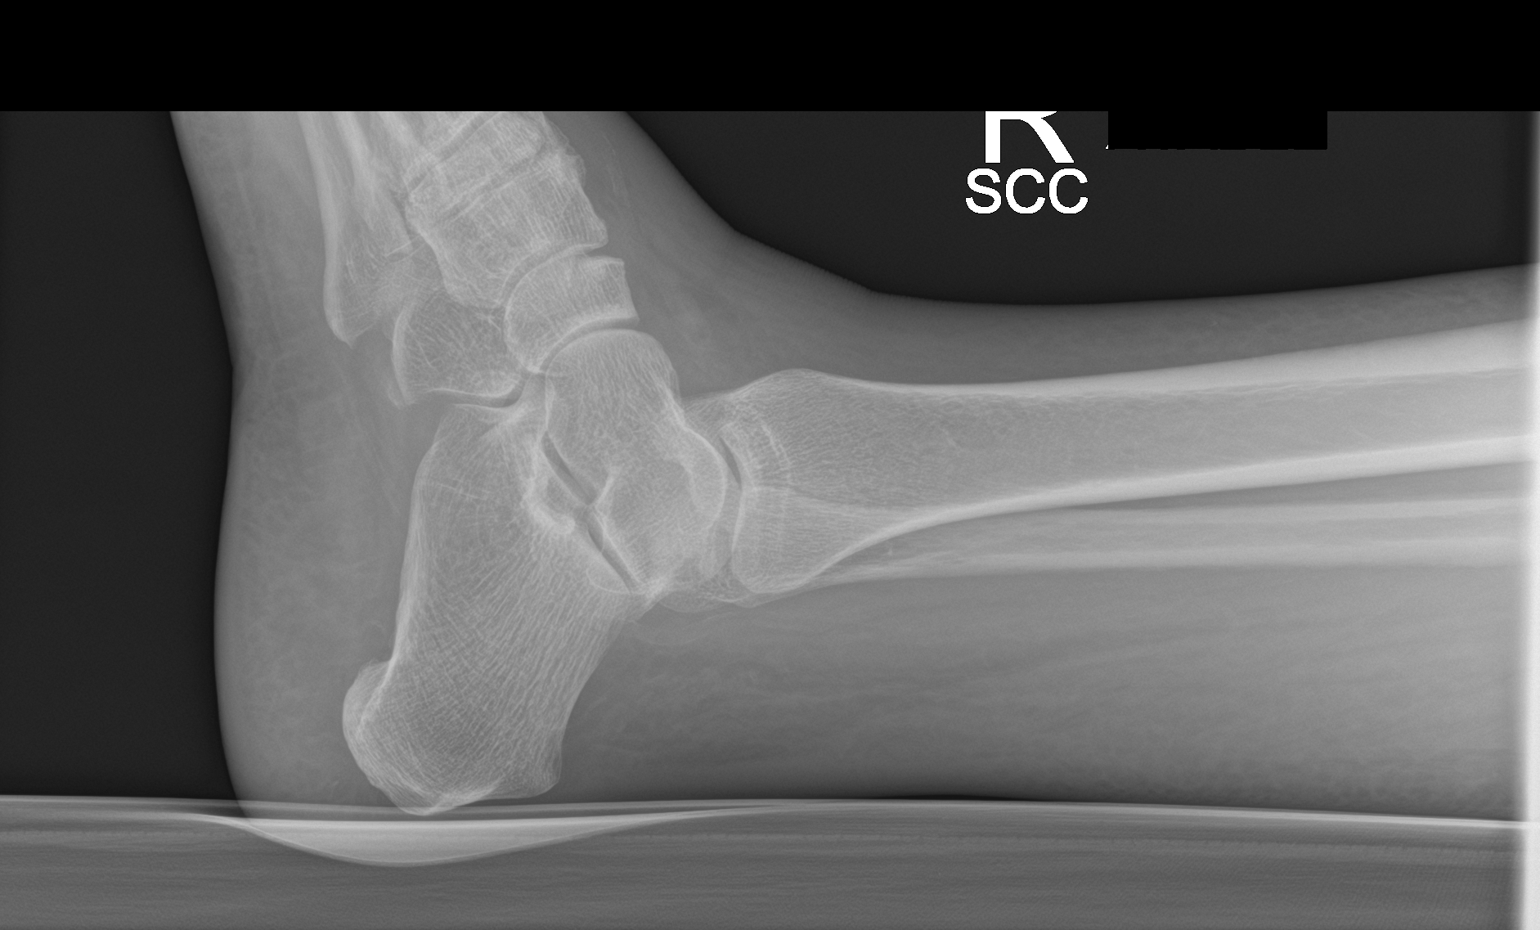

[3 of 3 positions shown; findings below may reference images not displayed]

FINDINGS: No fracture.  No bone lesion.

Ankle mortise is normally spaced and aligned.

There is diffuse nonspecific soft tissue edema.
IMPRESSION: No fracture or dislocation.

## 2020-01-10 IMAGING — CR DG CERVICAL SPINE COMPLETE 4+V
6 series · 6 of 6 positions shown · non-contrast
Comparison: 12/19/2015

CLINICAL DATA: Pt arrived from home where she fell while walking
with her medical assistant. Pt states she fell backward after losing
her balance, striking the back of her head. Pt c/o posterior neck
pain as well as right ankle pain and swelling. Swelling and pain to
the dorsal side of right ankle/ foot and around toes. Pt is a type 2
diabetic. Unknown if pt is taking blood thinners

EXAM:
CERVICAL SPINE - COMPLETE 4+ VIEW

[c-spine lat]
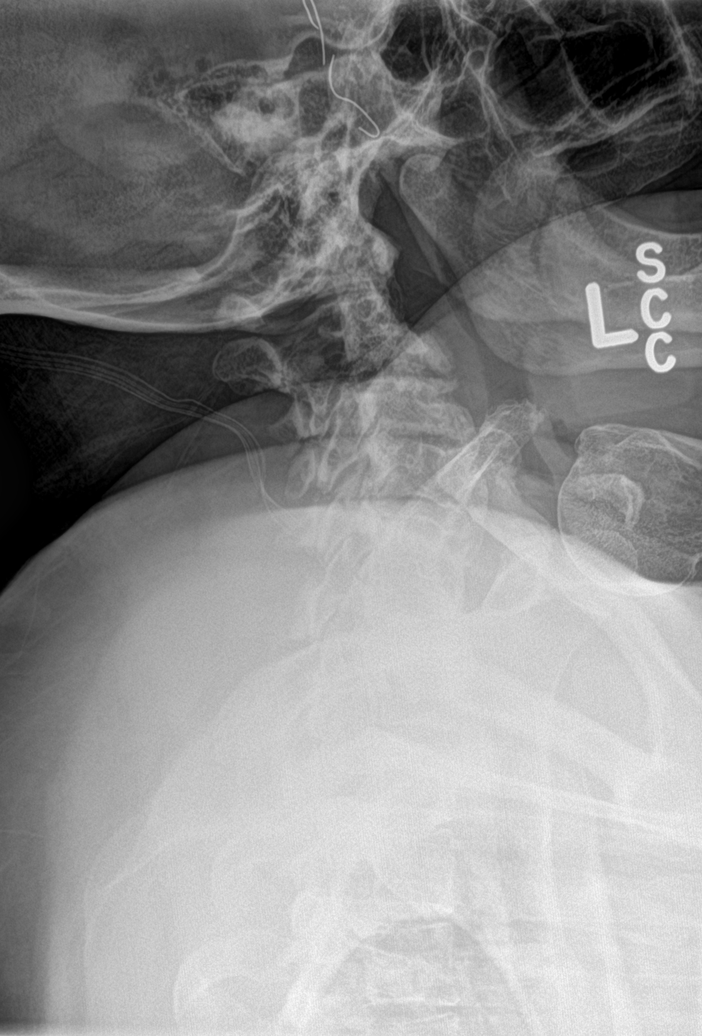

[c-spine obl (1 of 2)]
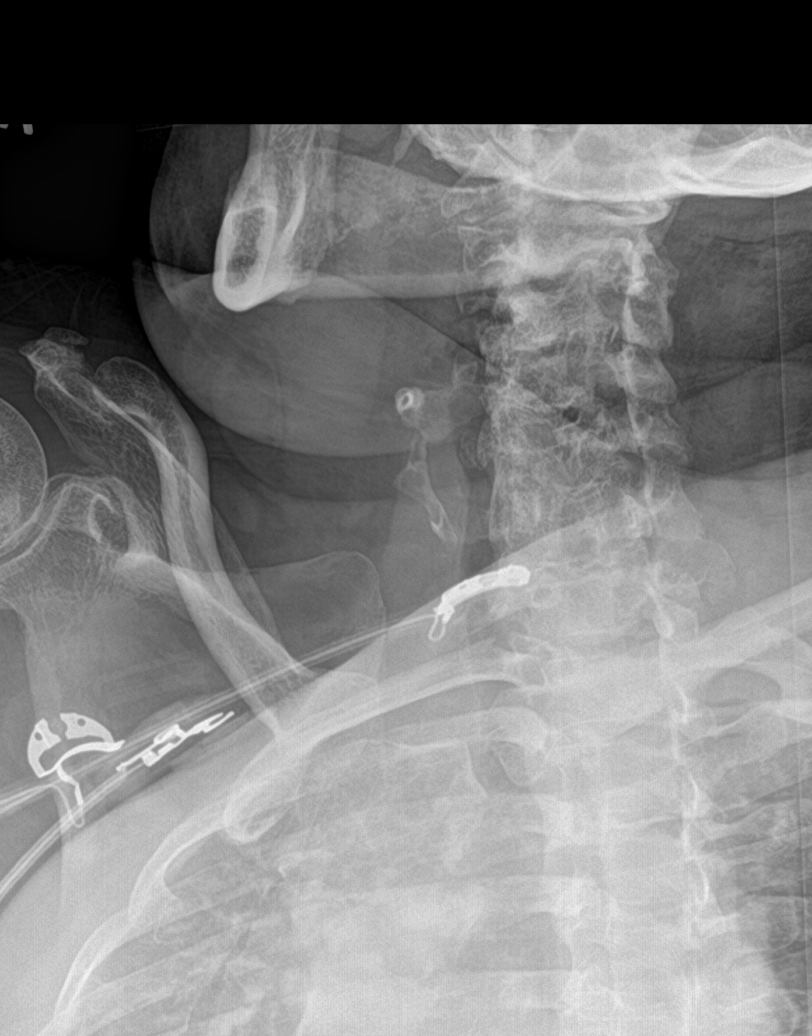

[c-spine obl (2 of 2)]
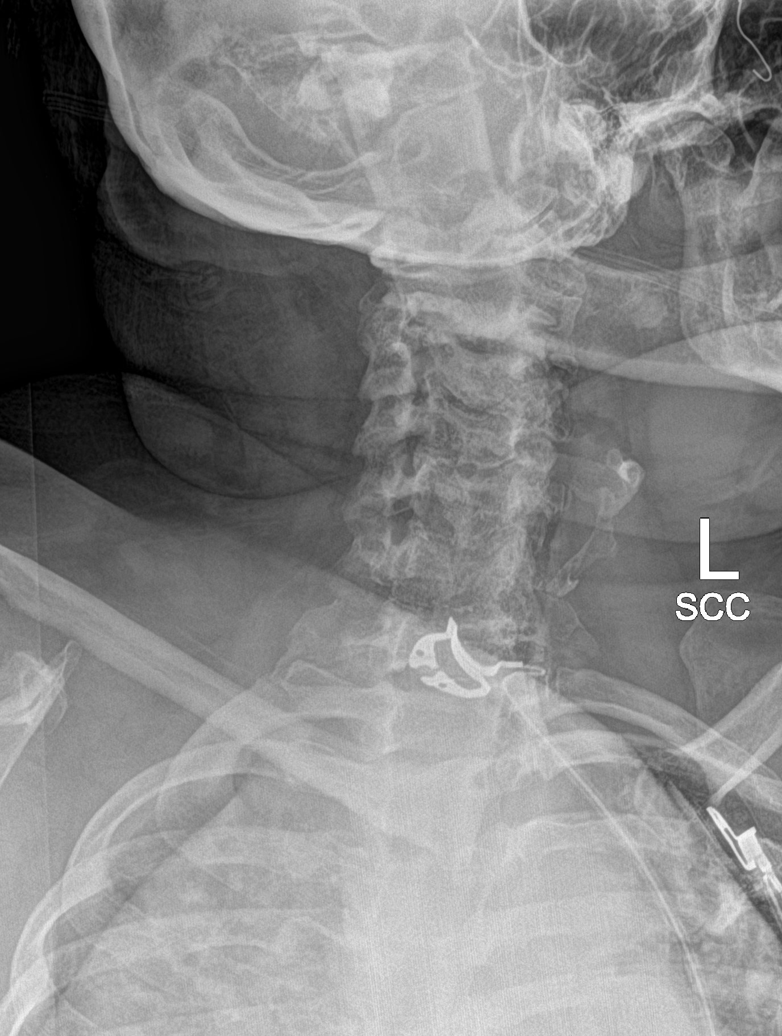

[c-spine ap]
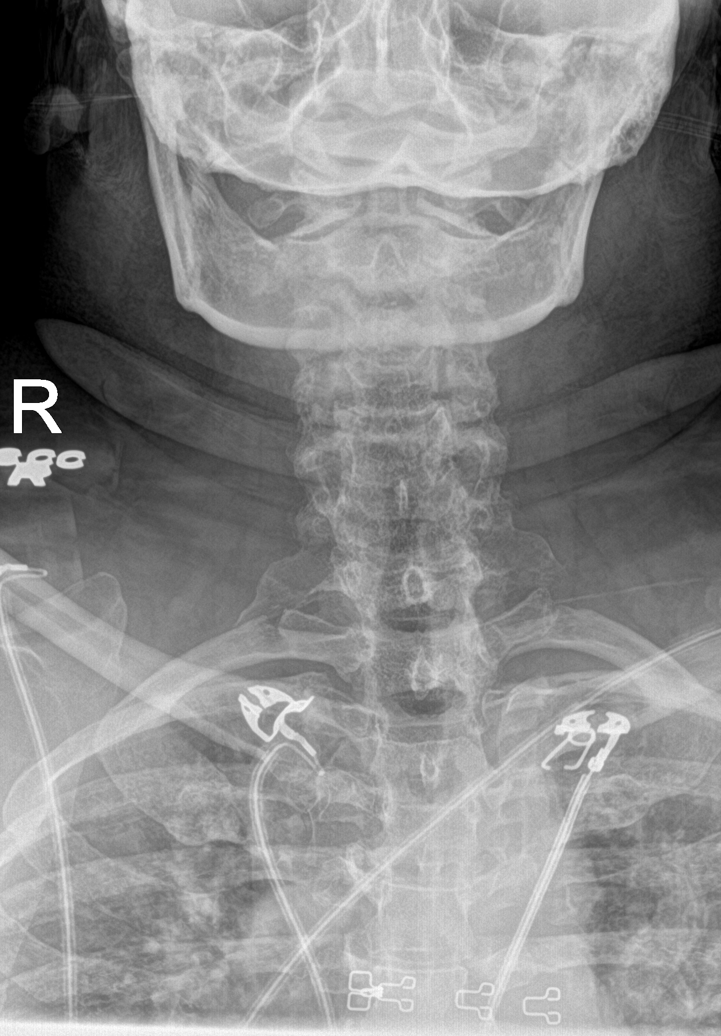

[c-spine open mouth]
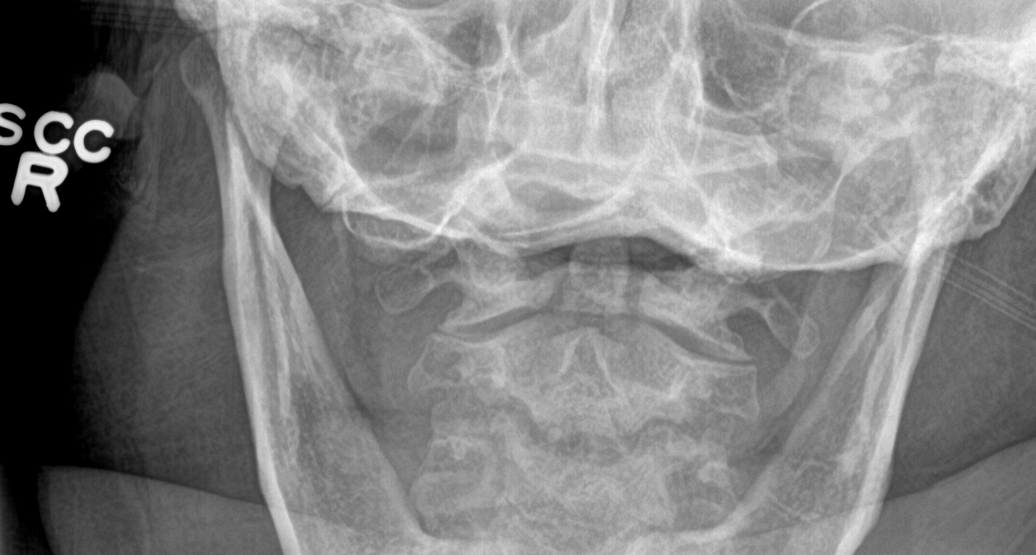

[c-spine swimmers]
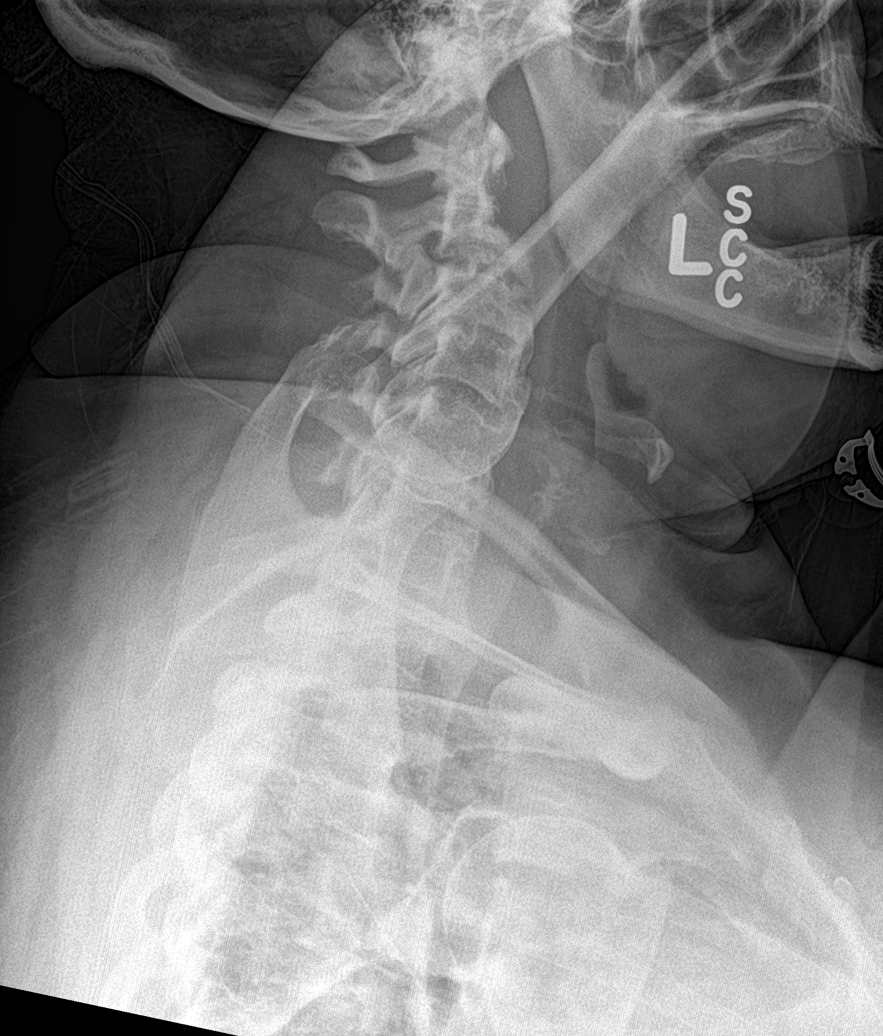

[6 of 6 positions shown; findings below may reference images not displayed]

FINDINGS: No fracture.  No spondylolisthesis.

Mild to moderate loss disc height throughout the cervical spine.
Anterior bridging osteophytes noted from C3 through C6.

Soft tissues are unremarkable.
IMPRESSION: 1. No fracture, spondylolisthesis or acute finding

## 2020-01-14 ENCOUNTER — Ambulatory Visit: Payer: Medicare Other | Admitting: Physical Therapy

## 2020-01-14 IMAGING — CT CT CERVICAL SPINE W/O CM
3 of 4 series · 11 of 33 positions shown, 13 images · non-contrast
Comparison: None.

CLINICAL DATA: Neck pain after fall 2 weeks ago.

EXAM:
CT CERVICAL SPINE WITHOUT CONTRAST
TECHNIQUE: Multidetector CT imaging of the cervical spine was performed without
intravenous contrast. Multiplanar CT image reconstructions were also
generated.

[Series 6: orthogonal bone · axial · 0.23mm/px · z∈[+29,+137]mm · 3 of 88 slices shown, 4 images]
[im 15/88  soft-tissue]
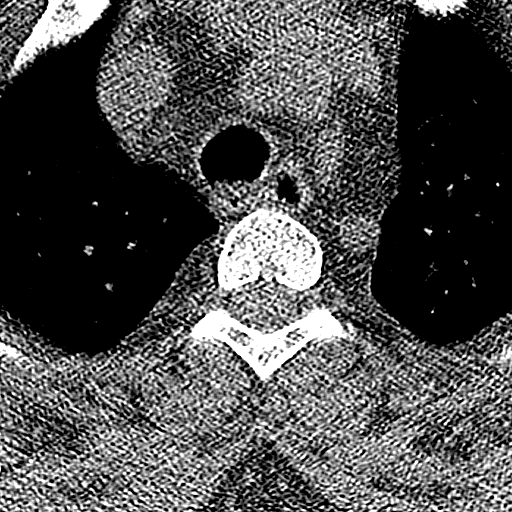
[im 15/88  bone]
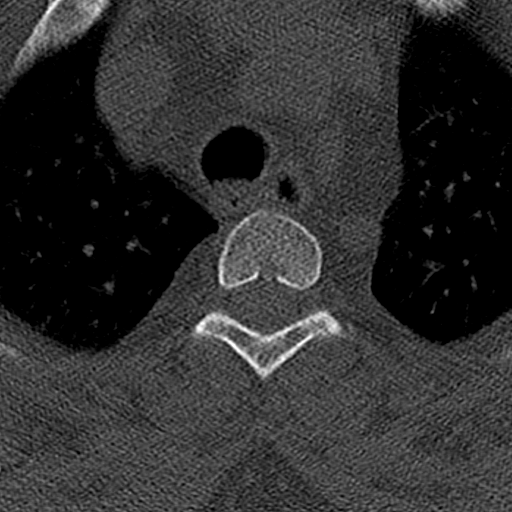
[im 44/88  bone]
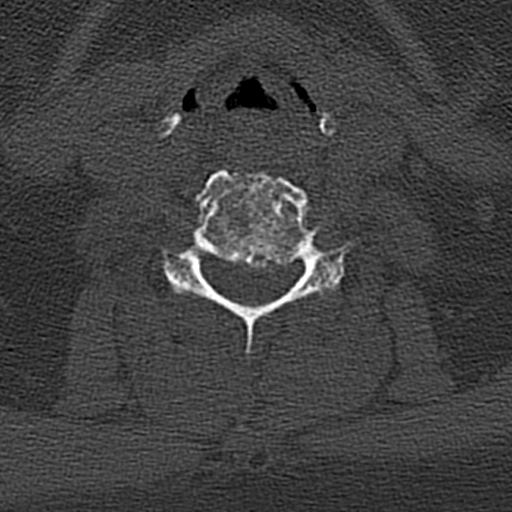
[im 73/88  bone]
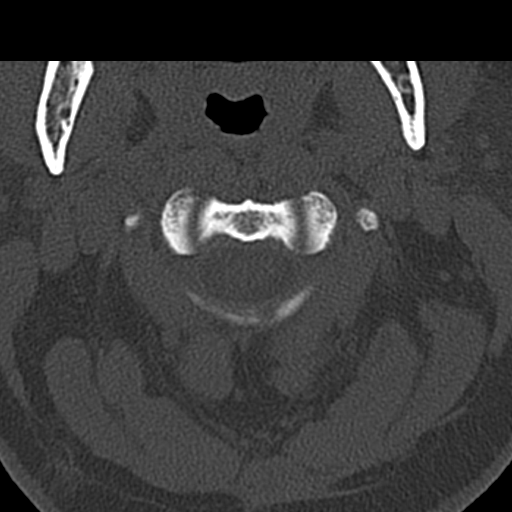

[Series 7: coronal bone · coronal · 0.22mm/px · 3 of 61 slices shown]
[im 13/61  bone]
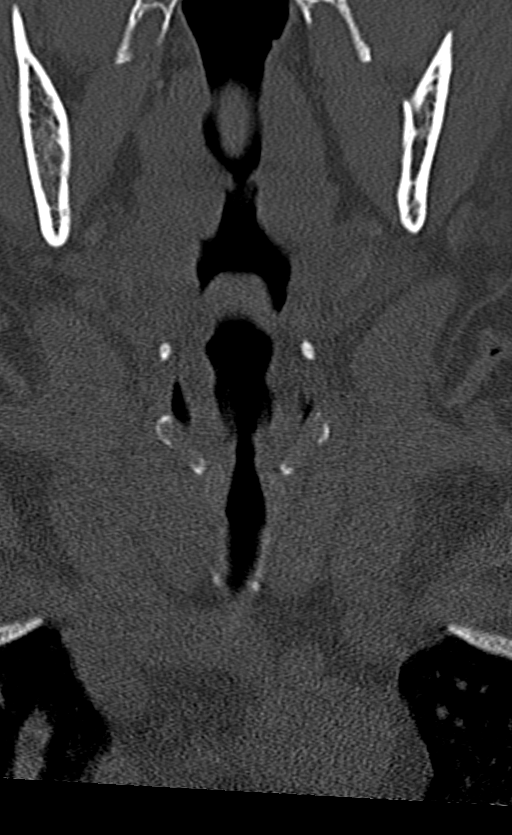
[im 25/61  bone]
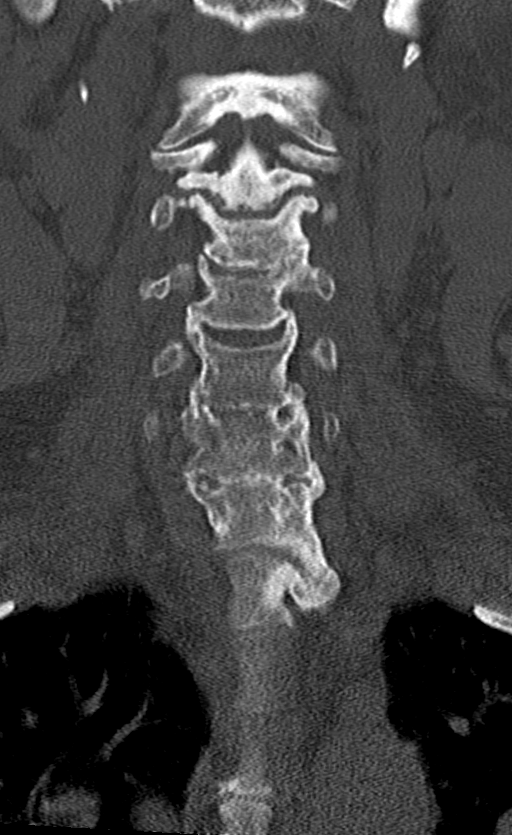
[im 37/61  bone]
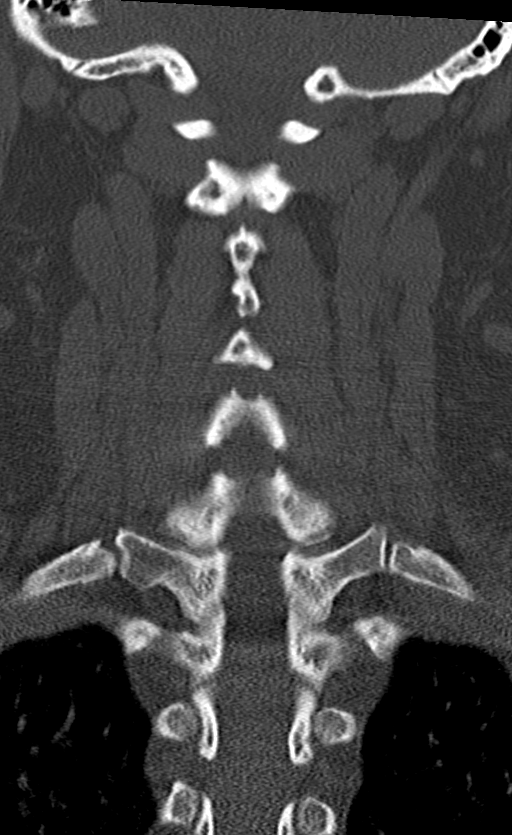

[Series 8: sagittal bone · sagittal · 0.20mm/px · 5 of 61 slices shown, 6 images]
[im 21/61  bone]
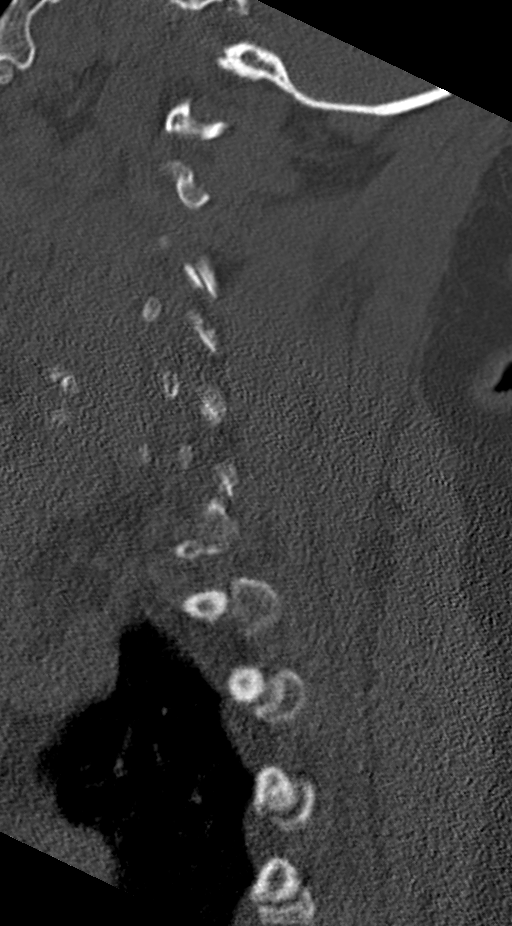
[im 26/61  bone]
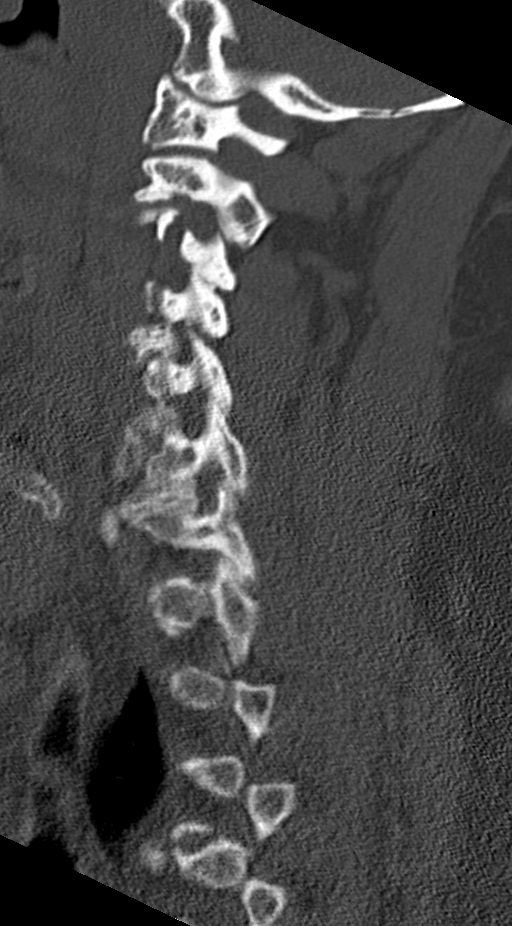
[im 31/61  soft-tissue]
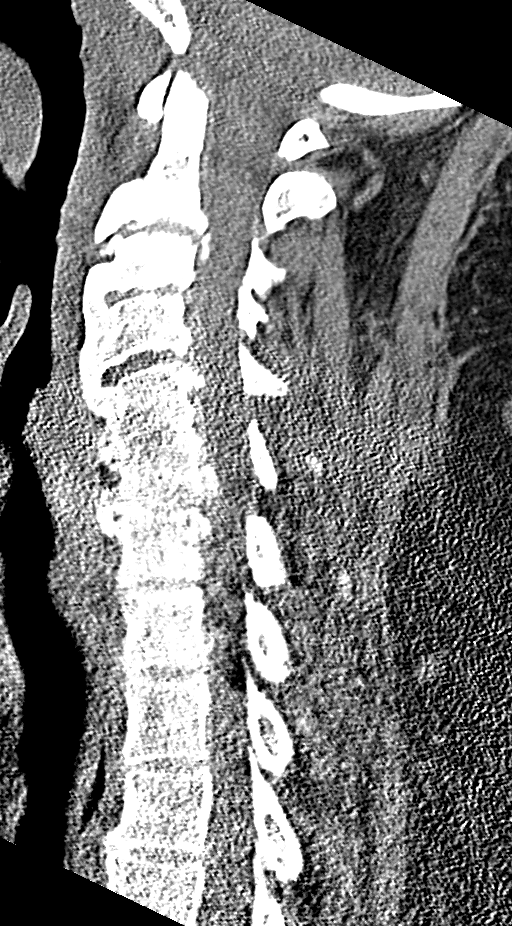
[im 31/61  bone]
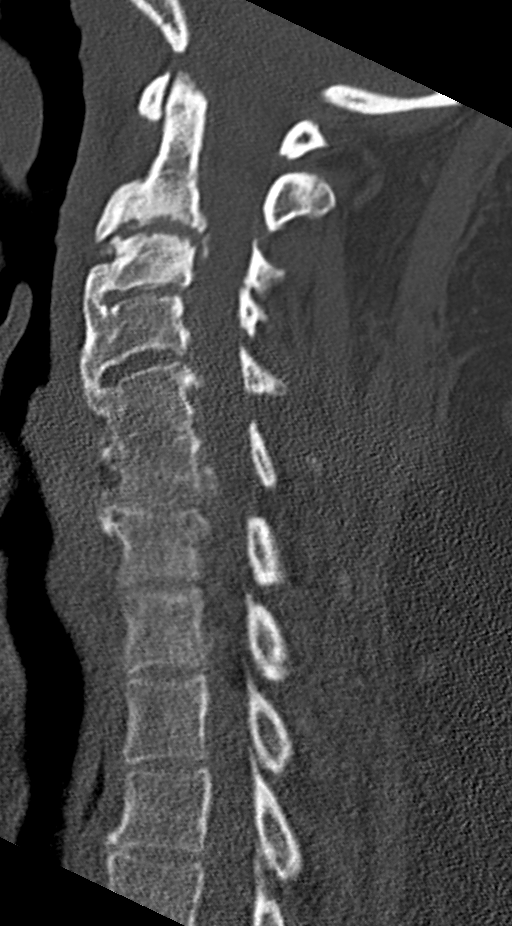
[im 36/61  bone]
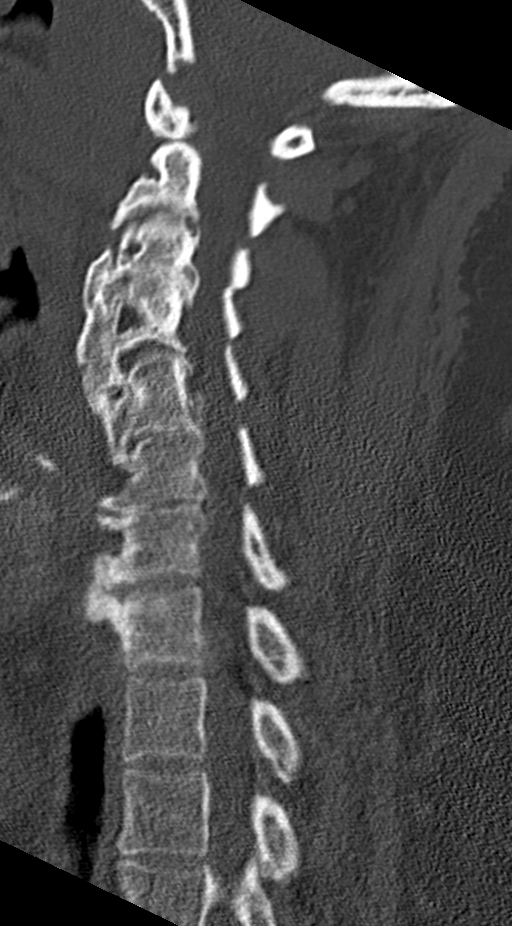
[im 41/61  bone]
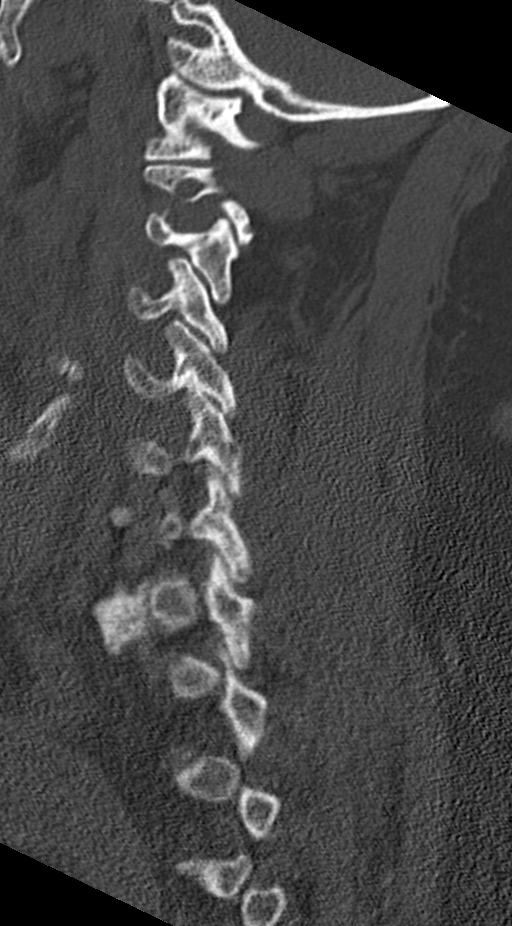

[11 of 33 positions shown; findings below may reference images not displayed]

FINDINGS: ALIGNMENT: Straightened lordosis.  Vertebral bodies in alignment.

SKULL BASE AND VERTEBRAE: Cervical vertebral bodies and posterior
elements are intact. Bulky ventral osteophytes seen with DISH. C5-6
and C6-7 auto interbody arthrodesis. C2-3 endplate irregularity,
high maintained. C1-2 articulation maintained. No destructive bone
lesions.

SOFT TISSUES AND SPINAL CANAL: Mild prevertebral soft tissue
swelling.

DISC LEVELS: Moderate canal stenosis C2-3, C4-5, C5-6 and C6-7.
Severe bilateral C3-4, RIGHT C4-5, LEFT C5-6 neural foraminal
narrowing.

UPPER CHEST: Lung apices are clear.

OTHER: None.
IMPRESSION: 1. No acute fracture or malalignment.
2. Mild prevertebral soft tissue swelling may be infectious or
inflammatory. C2-3 endplate irregularity potentially is associated.
MRI of the cervical spine with and without contrast is recommended.
3. Multilevel moderate canal stenosis and severe neural foraminal
narrowing.

## 2020-01-14 IMAGING — DX DG KNEE COMPLETE 4+V*R*
4 series · 4 of 4 positions shown · non-contrast
Comparison: None.

CLINICAL DATA: Fall 2 weeks ago

EXAM:
RIGHT KNEE - COMPLETE 4+ VIEW

[knee ap]
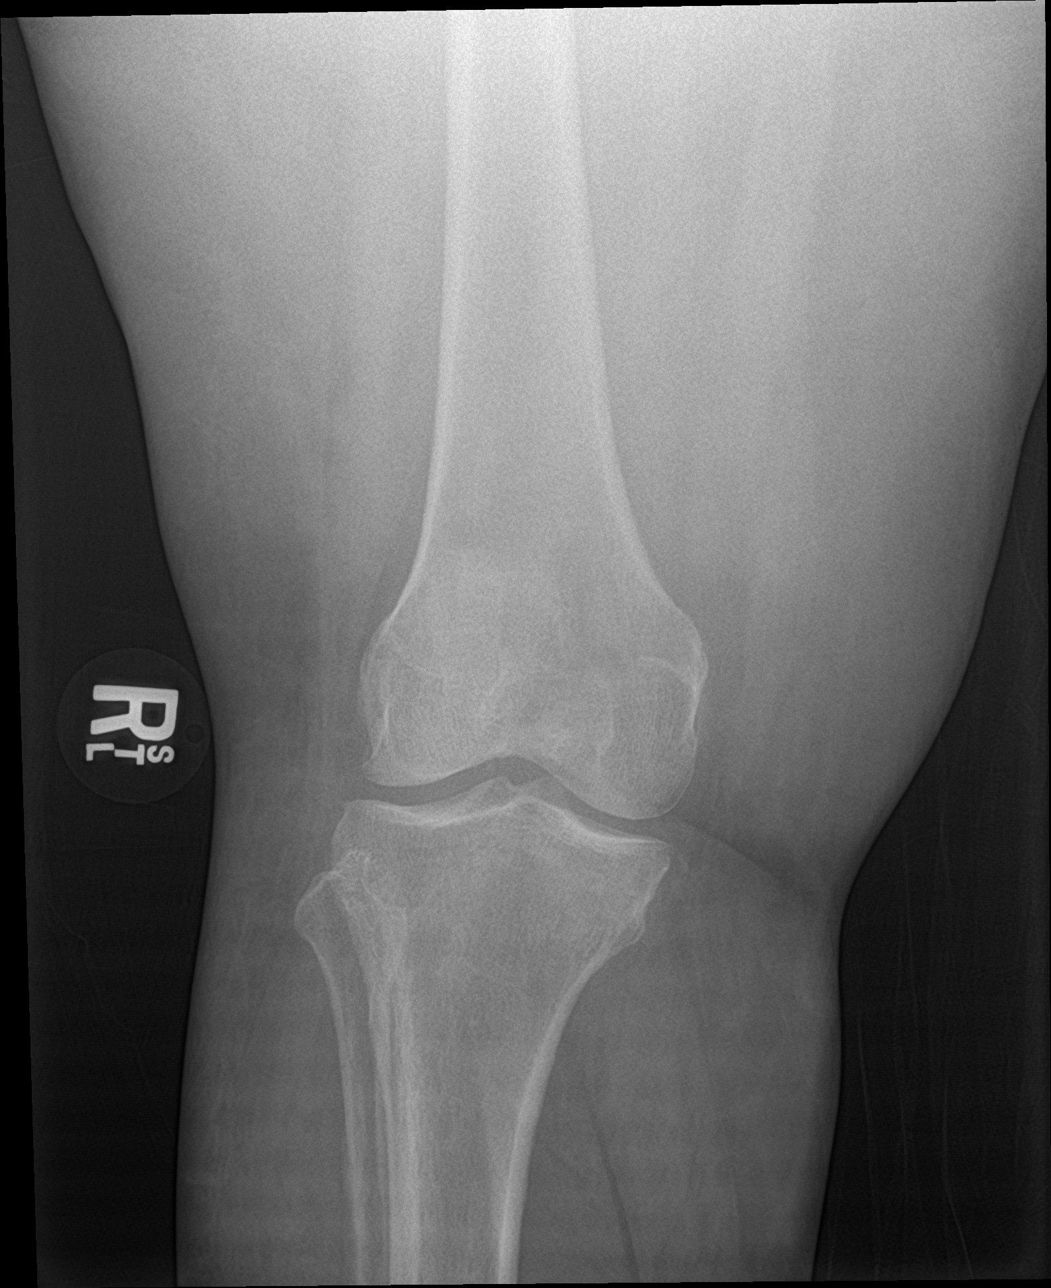

[knee lat (1 of 2)]
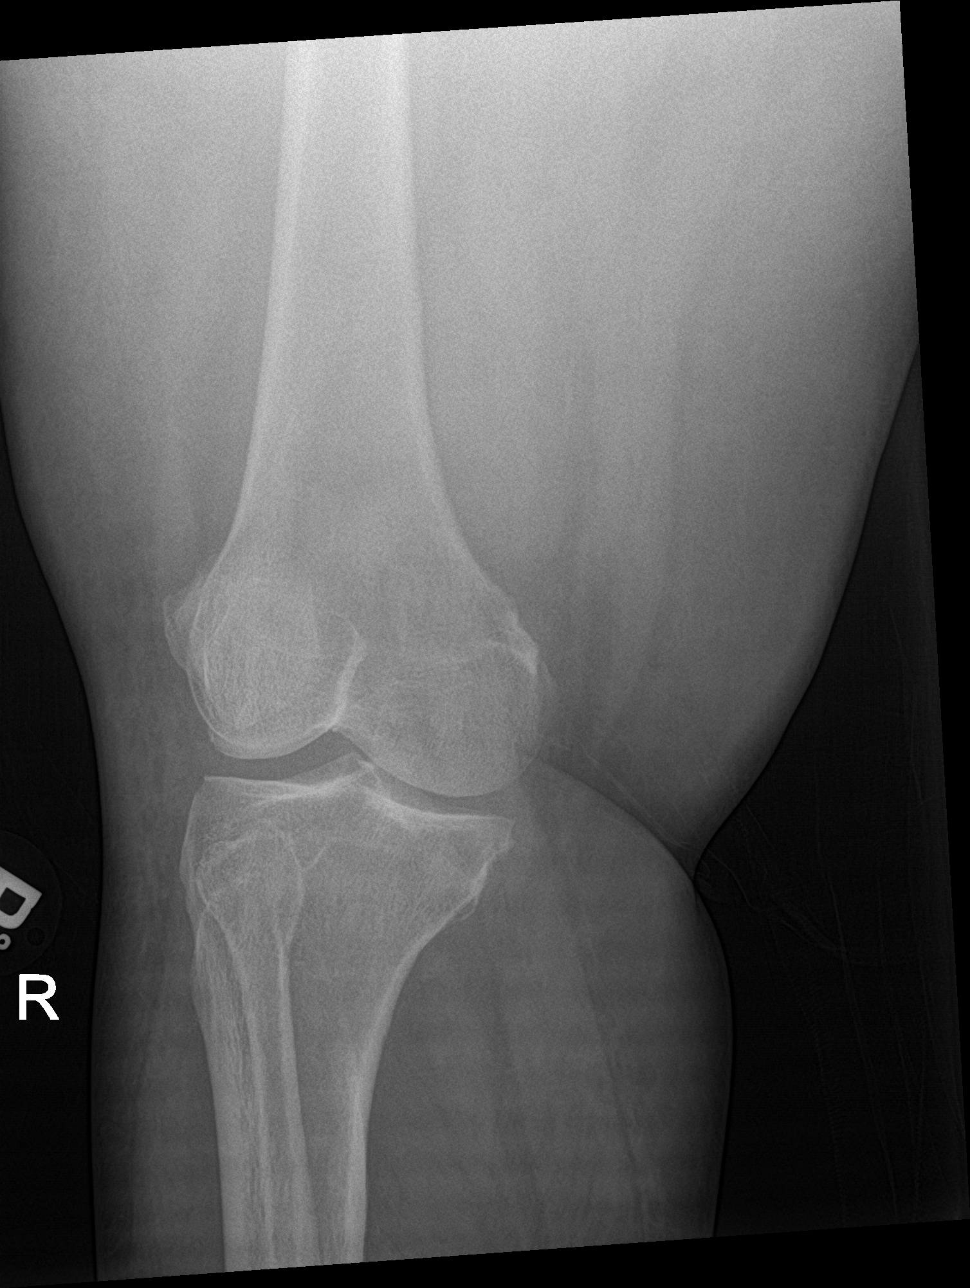

[knee obl]
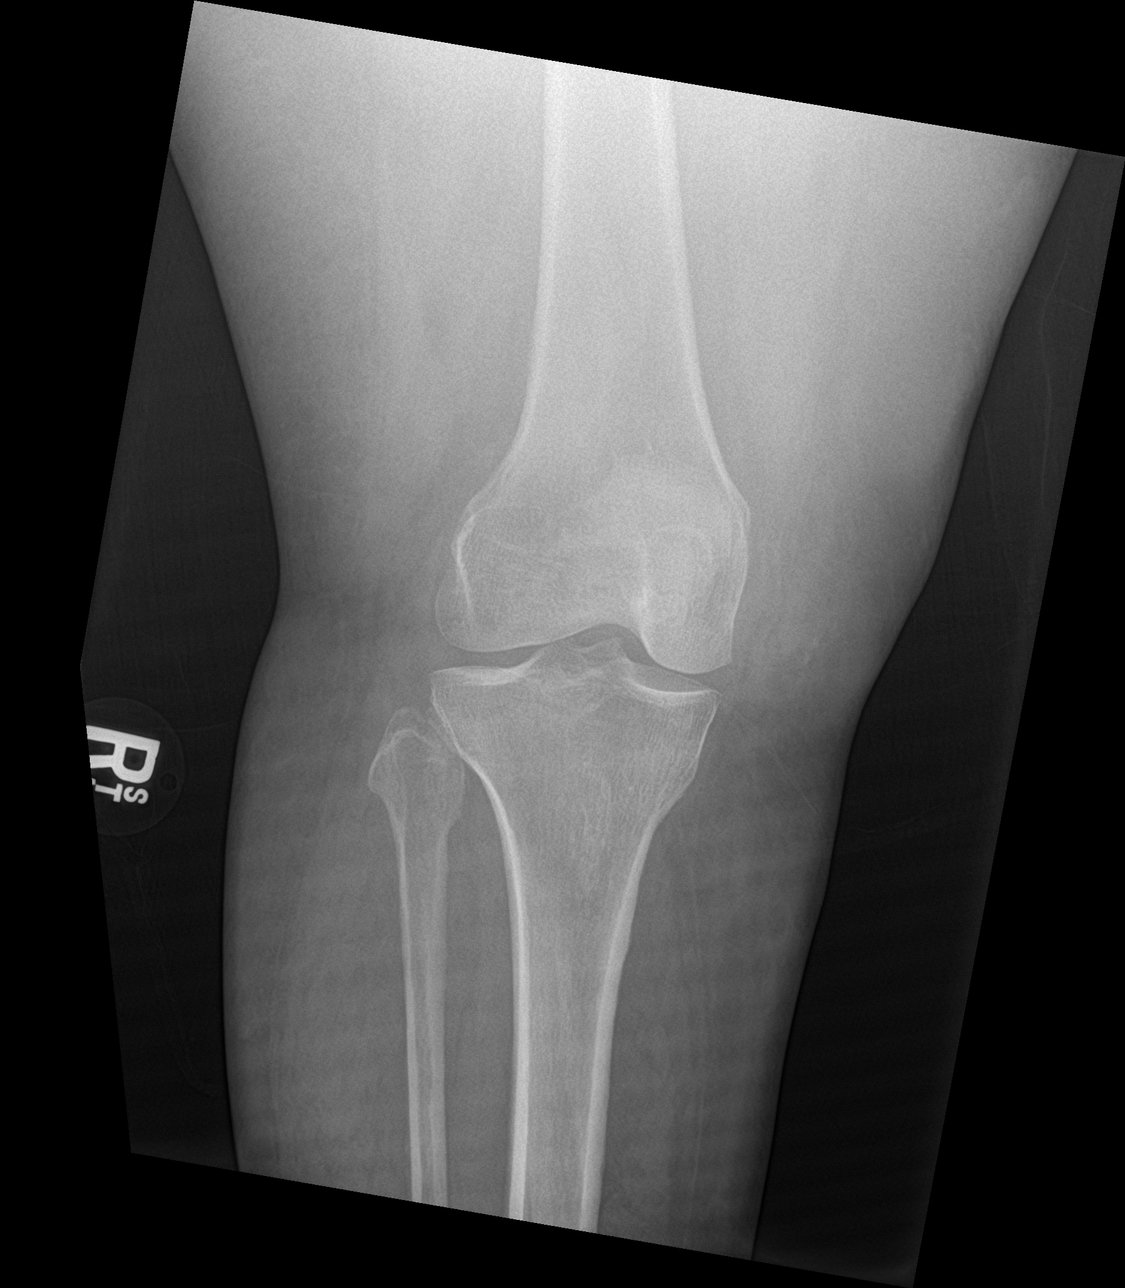

[knee lat (2 of 2)]
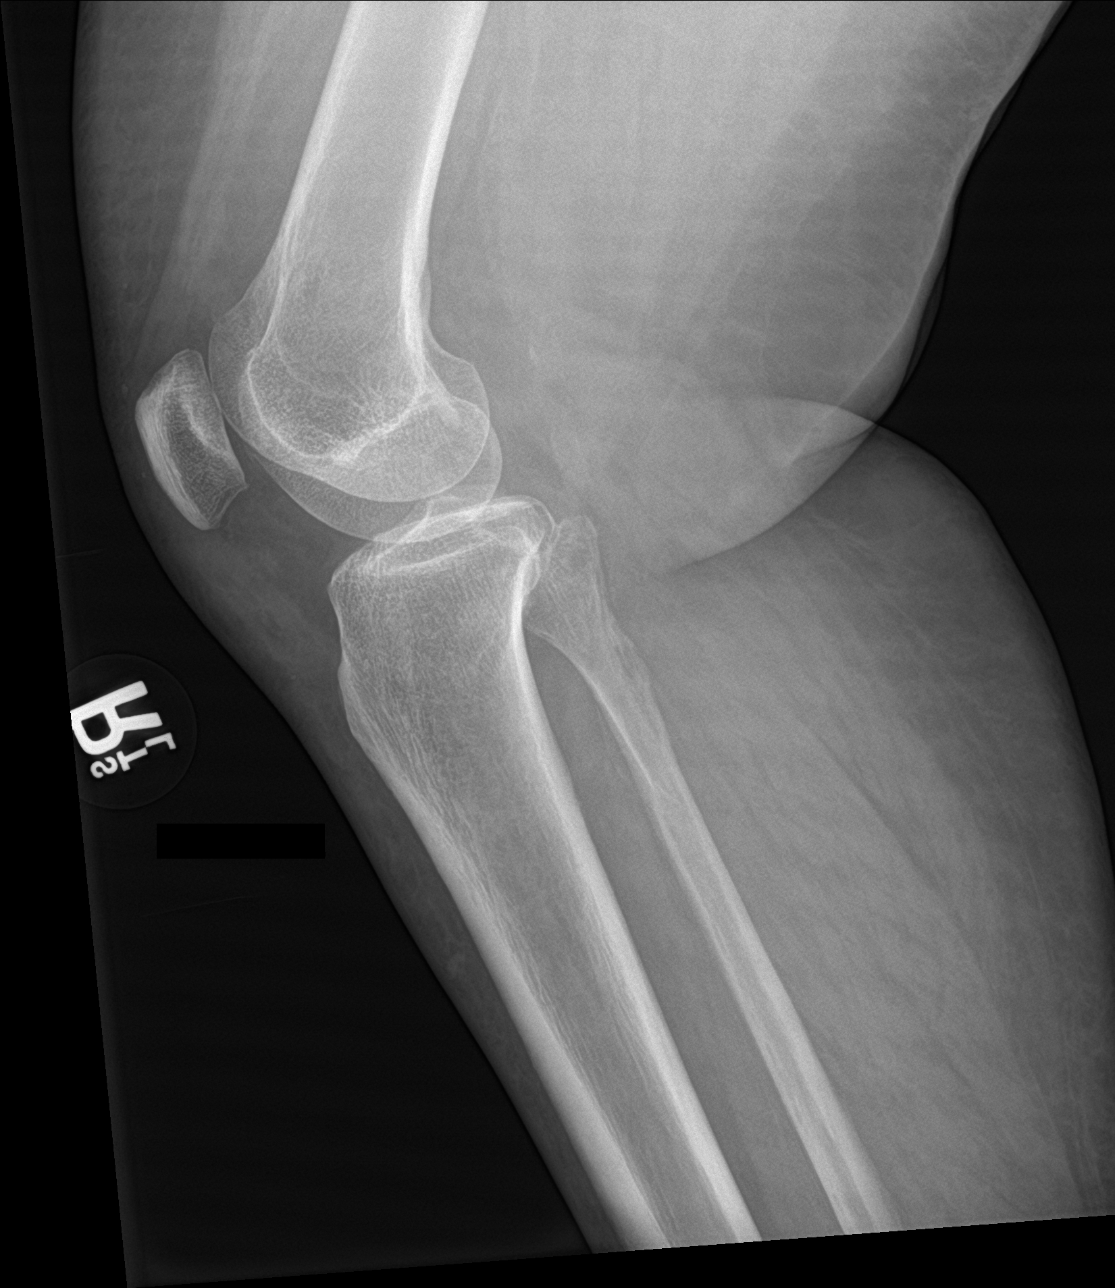

[4 of 4 positions shown; findings below may reference images not displayed]

FINDINGS: No fracture or dislocation is seen.

The joint spaces are preserved.

The visualized soft tissues are unremarkable.

No suprapatellar knee joint effusion.
IMPRESSION: Negative.

## 2020-01-14 IMAGING — DX DG ANKLE COMPLETE 3+V*L*
3 series · 3 of 3 positions shown · non-contrast
Comparison: None.

CLINICAL DATA: Fall 2 weeks ago, recent left ankle pain

EXAM:
LEFT ANKLE COMPLETE - 3+ VIEW

[ankle ap]
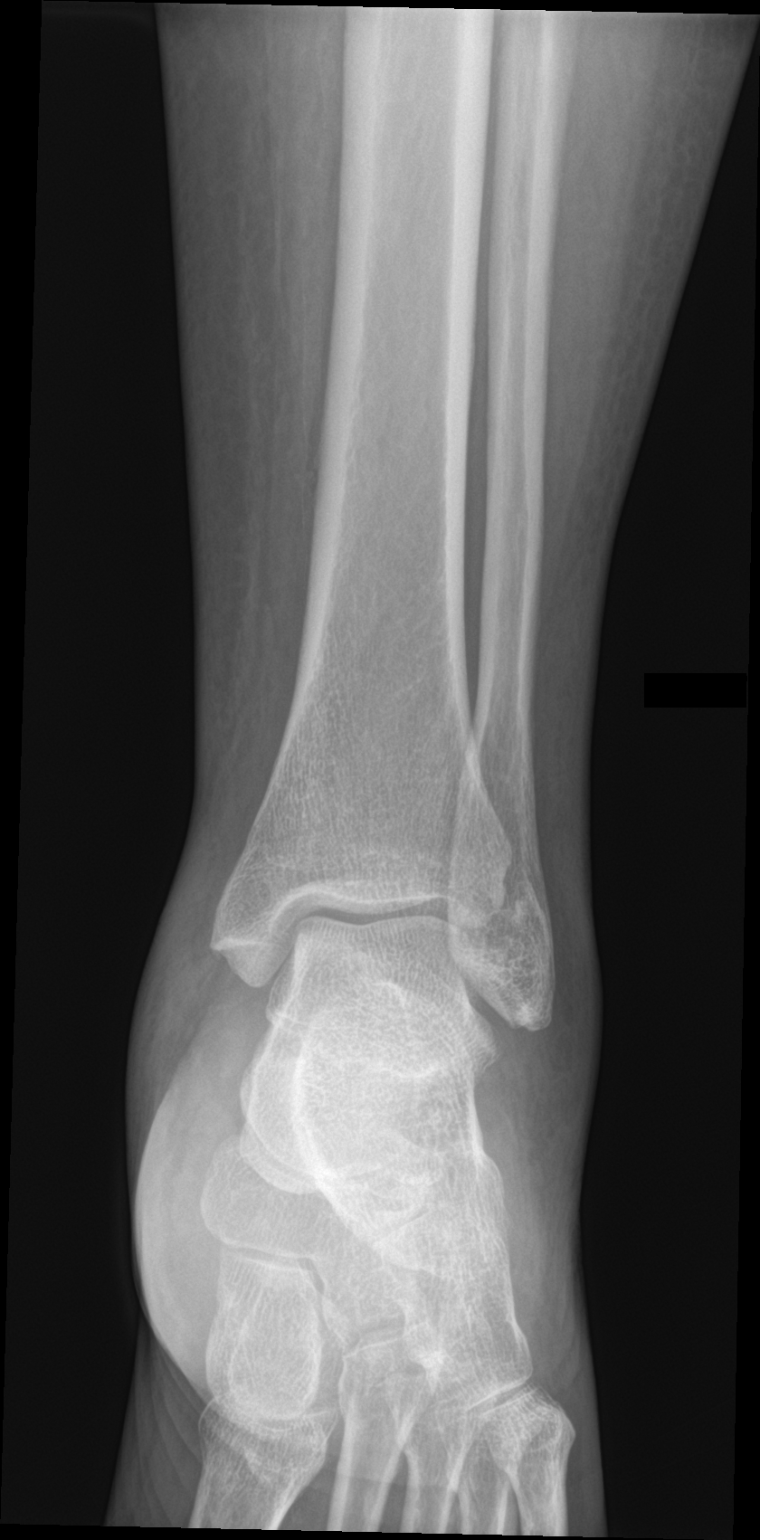

[ankle obl]
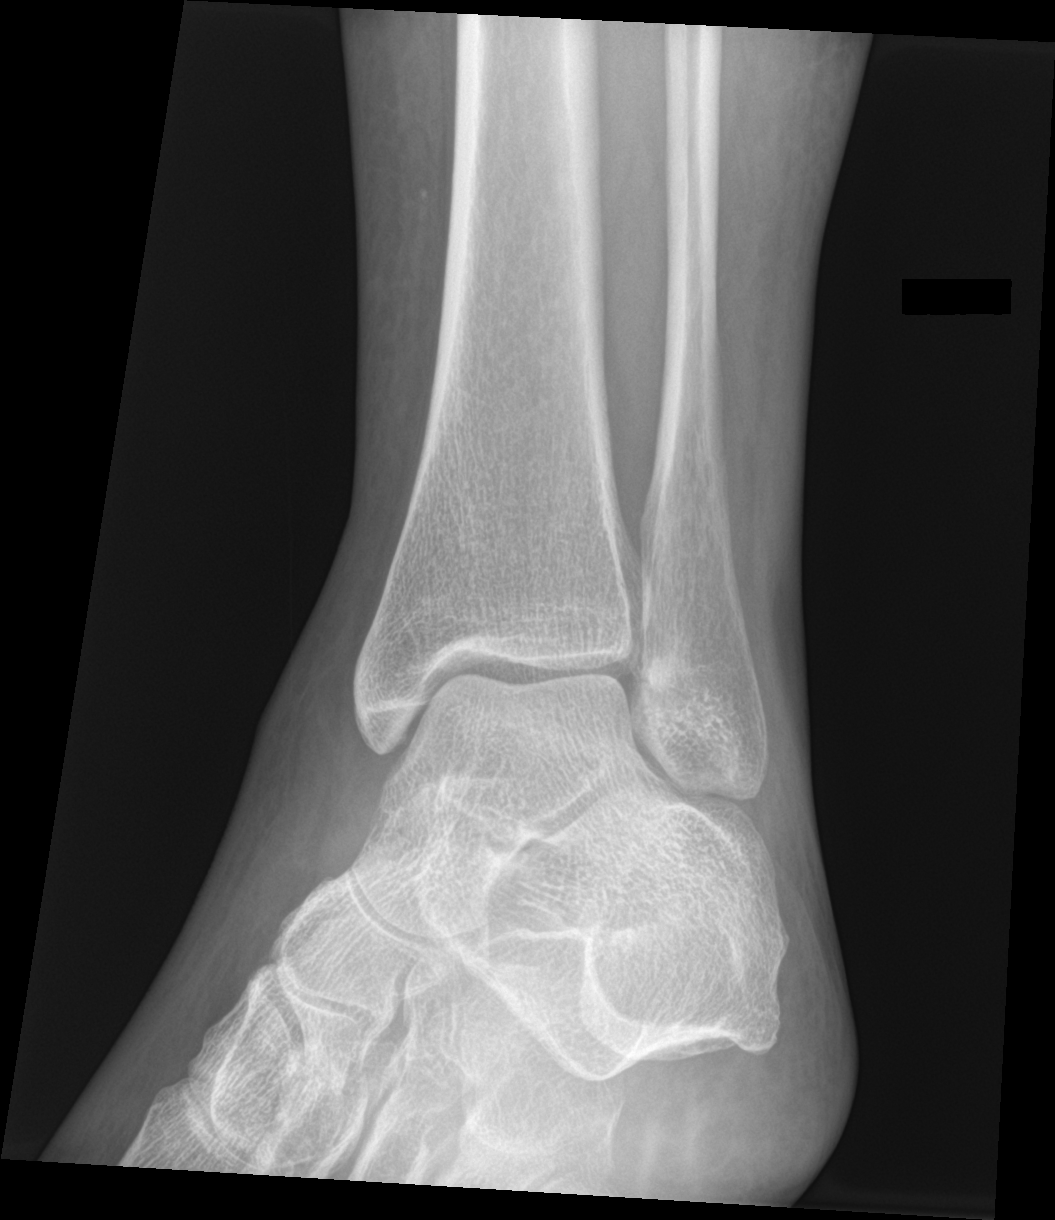

[ankle lat]
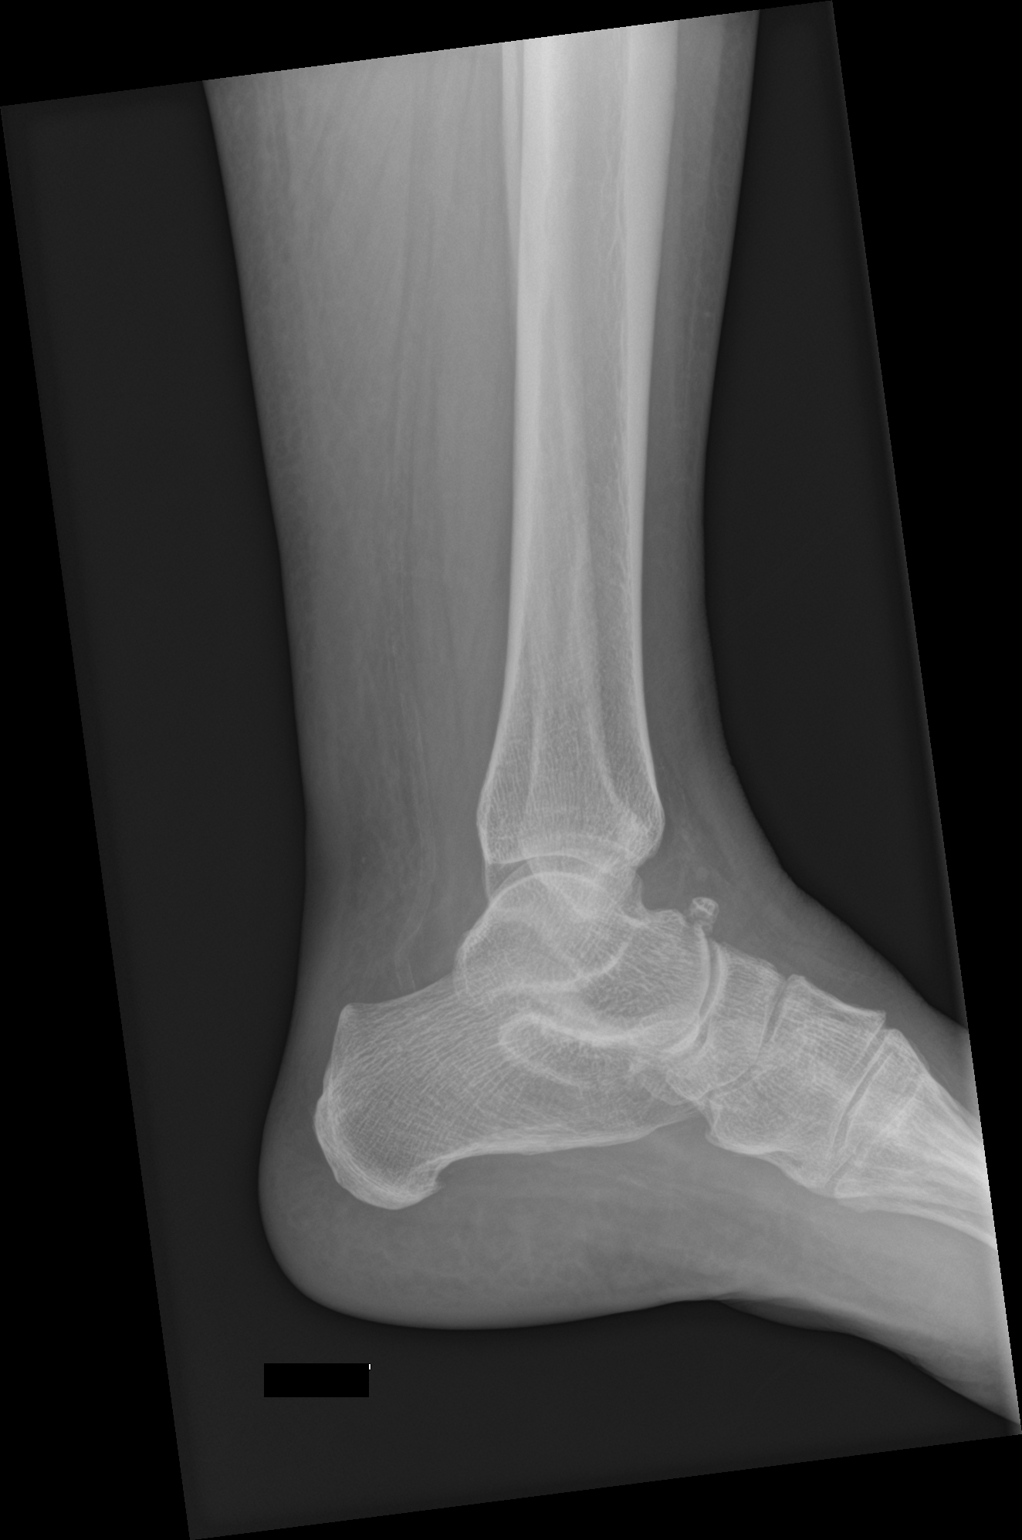

[3 of 3 positions shown; findings below may reference images not displayed]

FINDINGS: No fracture or dislocation is seen.

The ankle mortise is intact.

The base of the fifth metatarsal is unremarkable.

The visualized soft tissues are unremarkable.
IMPRESSION: Negative.

## 2020-01-15 ENCOUNTER — Telehealth: Payer: Self-pay | Admitting: Internal Medicine

## 2020-01-15 IMAGING — MR MR CERVICAL SPINE WO/W CM
4 of 9 series · 18 of 48 positions shown · IV contrast (Yes)
Comparison: Cervical spine CT 12/11/2017.  Brain MRI 08/06/2012.

CLINICAL DATA: 56-year-old with severe neck and lower extremity
pain. Unable to ambulate. Patient fell 2 weeks ago.

EXAM:
MRI CERVICAL SPINE WITHOUT AND WITH CONTRAST
TECHNIQUE: Multiplanar and multiecho pulse sequences of the cervical spine, to
include the craniocervical junction and cervicothoracic junction,
were obtained without and with intravenous contrast.
CONTRAST:  20mL MULTIHANCE GADOBENATE DIMEGLUMINE 529 MG/ML IV SOLN

[Series 2: T1 · sagittal · 3.0mm · 0.41mm/px · 3 of 14 slices shown (1 of 2)]
[im 1/14]
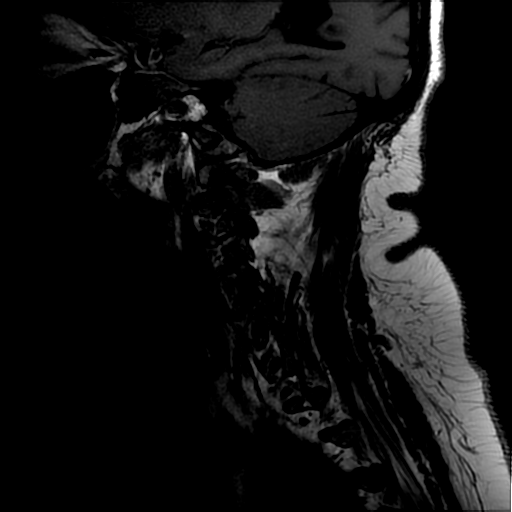
[im 7/14]
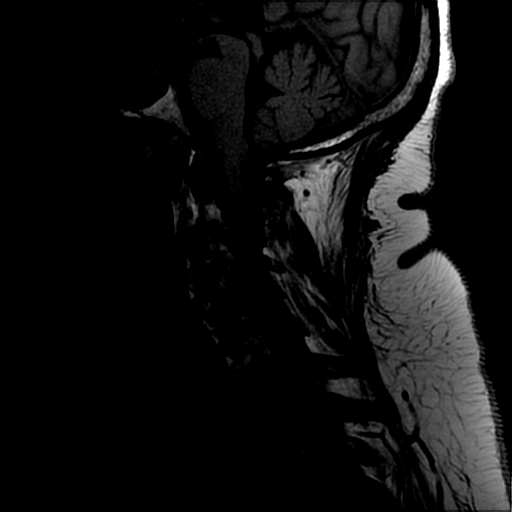
[im 14/14]
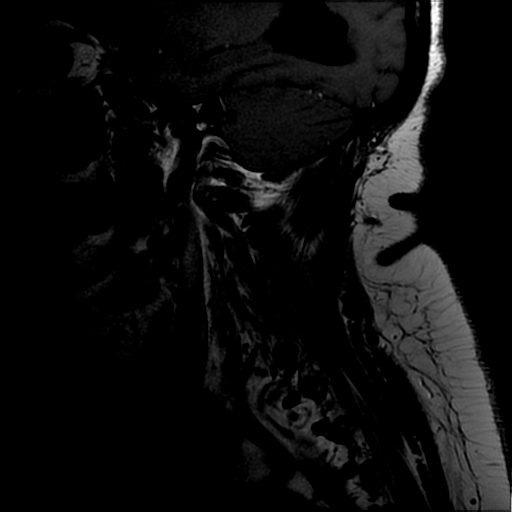

[Series 6: T2 · axial · 3.1mm · 0.35mm/px · z∈[-65,+35]mm · 8 of 30 slices shown]
[im 1/30]
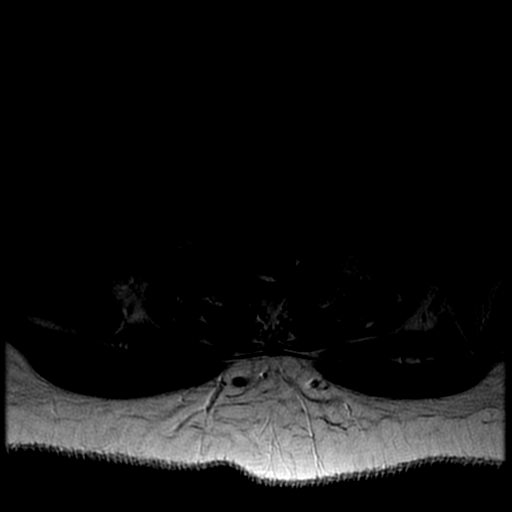
[im 5/30]
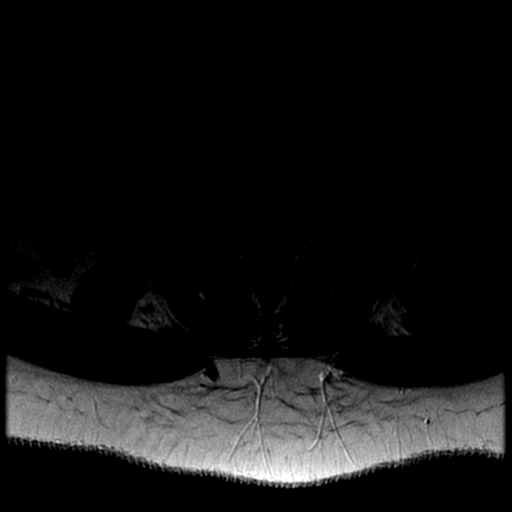
[im 9/30]
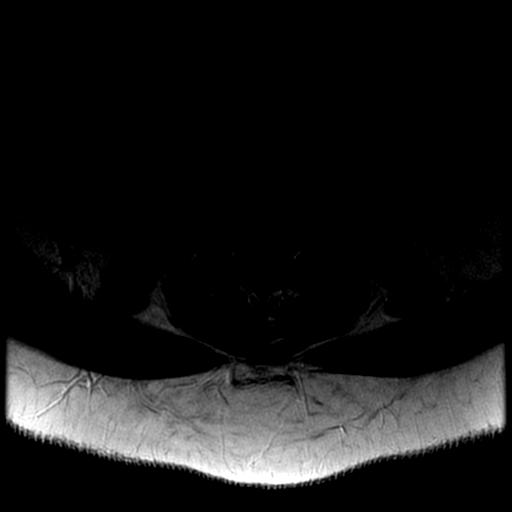
[im 13/30]
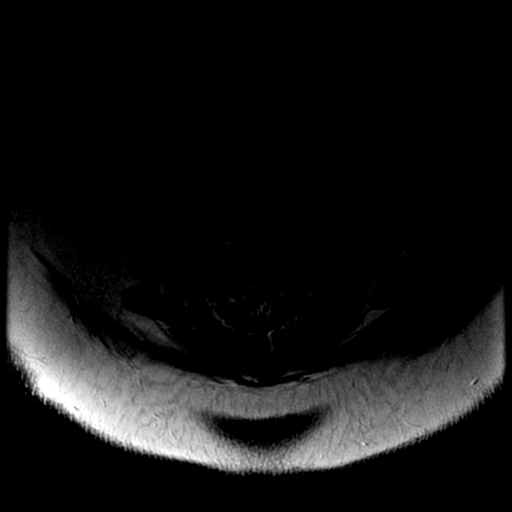
[im 17/30]
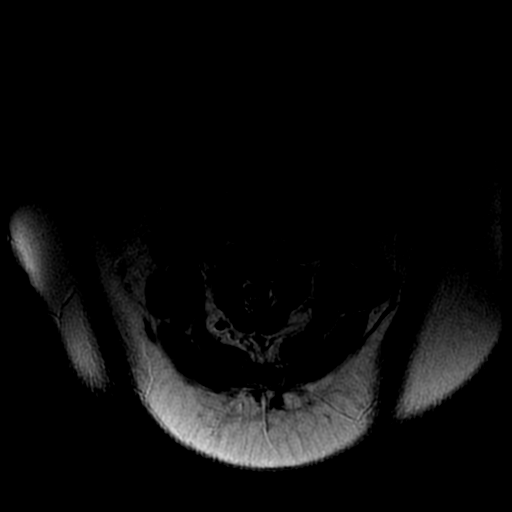
[im 21/30]
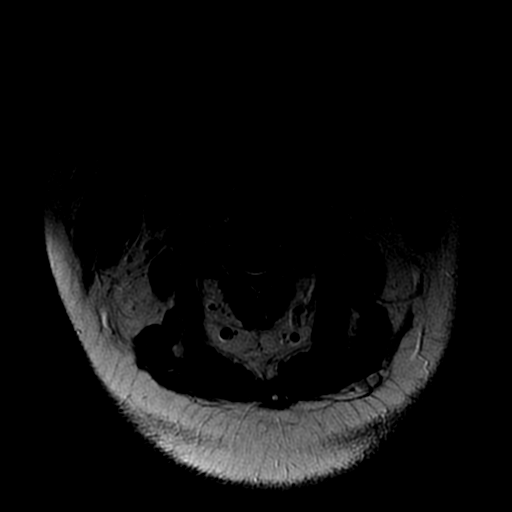
[im 25/30]
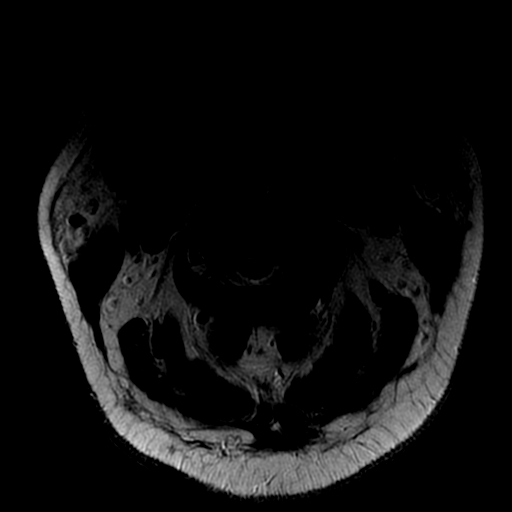
[im 30/30]
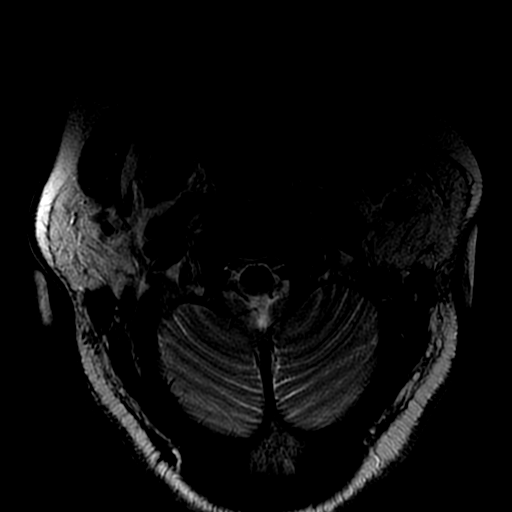

[Series 7: T1 · axial · non-contrast · 3.1mm · 0.35mm/px · z∈[-51,+18]mm · 3 of 30 slices shown (2 of 2)]
[im 5/30]
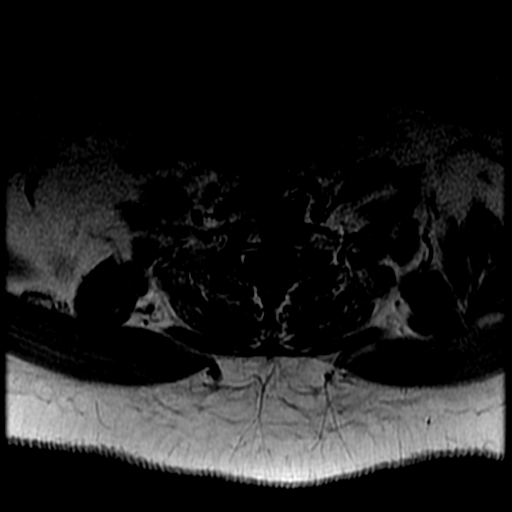
[im 17/30]
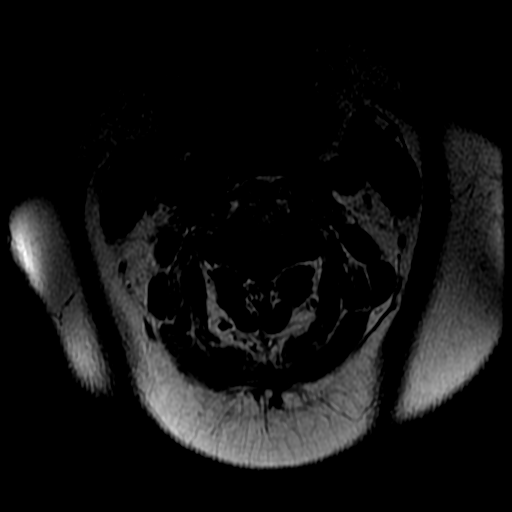
[im 25/30]
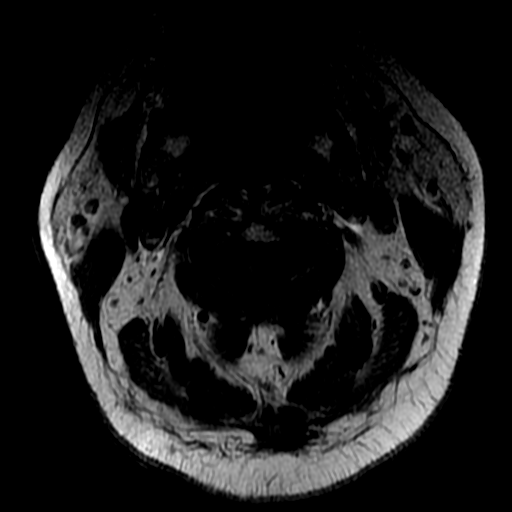

[Series 8: T2 post-contrast · sagittal · 3.0mm · 0.41mm/px · 4 of 14 slices shown]
[im 1/14]
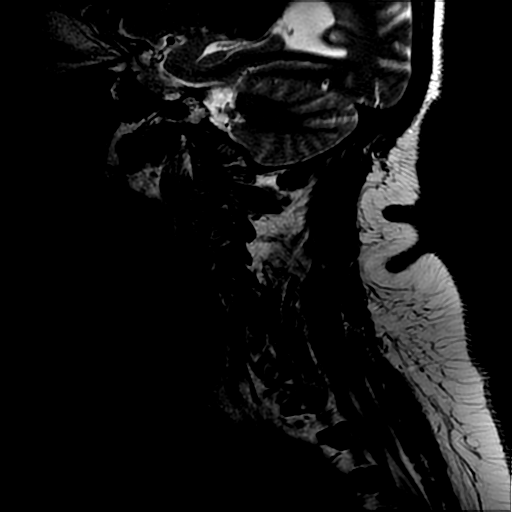
[im 5/14]
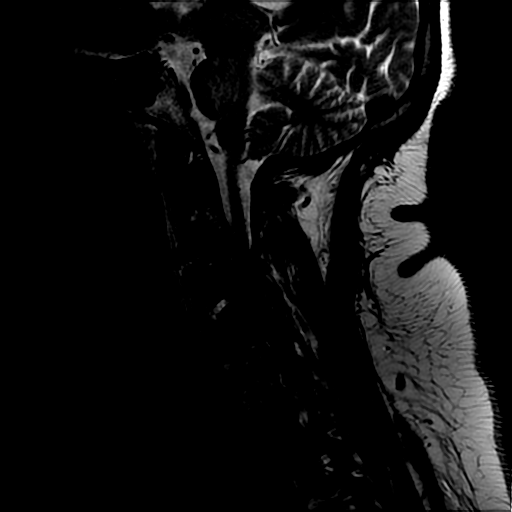
[im 9/14]
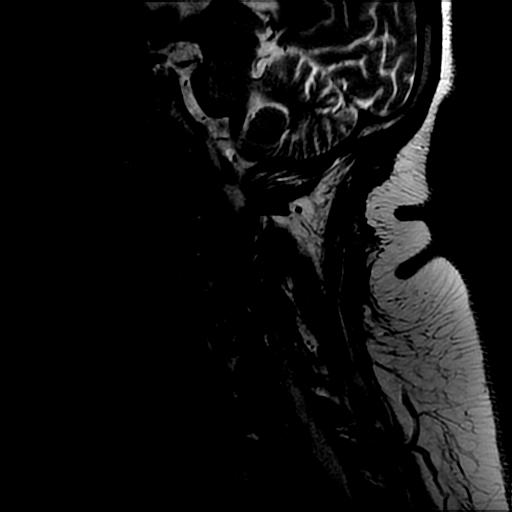
[im 14/14]
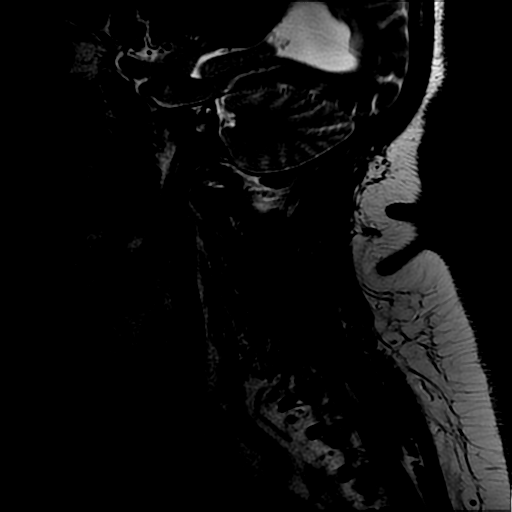

[18 of 48 positions shown; findings below may reference images not displayed]

FINDINGS: Alignment: Stable straightening without significant focal angulation
or listhesis.

Vertebrae: No acute or suspicious osseous findings. Bulky paraspinal
osteophytes consistent with diffuse idiopathic skeletal hyperostosis
are again noted. In correlation with recent CT, there is interbody
ankylosis at C5-6 and C6-7.

Cord: The CSF surrounding the cord is effaced at multiple levels,
and there is mild cord flattening, especially at C3-4 and C6-7. No
definite abnormal cord signal or enhancement.

Posterior Fossa, vertebral arteries, paraspinal tissues: Confluent
low-density in the pons is similar to previous brain MRI. Bilateral
vertebral artery flow voids. No definite paraspinal edema or
abnormal enhancement. Mild prominence of the prevertebral soft
tissues superiorly is attributed to normal pharyngeal mucosa.

Disc levels:

C2-3: Spondylosis with posterior osteophytes and asymmetric uncinate
spurring on the left. No cord deformity. Mild left foraminal
narrowing.

C3-4: Spondylosis with posterior osteophytes and bilateral uncinate
spurring. The CSF surrounding the cord is effaced with narrowing the
AP diameter of the canal to 7 mm. There is severe osseous foraminal
narrowing bilaterally.

C4-5: Posterior osteophytes and uncinate spurring contribute to
effacement of the CSF surrounding the cord, narrowing the AP
diameter of the canal to 8 mm. There is asymmetric moderate right
foraminal narrowing.

C5-6: Interbody ankylosis with asymmetric osteophyte formation on
the left, contributing to mild left-sided cord flattening and
narrowing the AP diameter of the canal to 8 mm. There is moderate
left foraminal narrowing.

C6-7: Interbody ankylosis with asymmetric posterior osteophyte
formation on the right, contributing to mild cord flattening and
narrowing the AP diameter of the canal to 6 mm. Mild foraminal
narrowing bilaterally.

C7-T1: No significant findings.
IMPRESSION: 1. No apparent acute findings.
2. Diffuse idiopathic skeletal hyperostosis with interbody ankylosis
at C5-6 and C6-7. Posterior osteophytes contribute to multilevel
spinal stenosis and foraminal narrowing as detailed above. Mild cord
flattening is present at multiple levels. No definite abnormal cord
signal or enhancement.
3. Stable probable chronic small vessel ischemic changes in the
pons.

## 2020-01-15 IMAGING — CT CT HIP*R* W/O CM
2 of 3 series · 16 of 46 positions shown, 18 images · non-contrast
Comparison: Radiographs 12/11/2017

CLINICAL DATA: Right hip pain after fall 1 week ago. Progressive
pain.

EXAM:
CT OF THE RIGHT HIP WITHOUT CONTRAST
TECHNIQUE: Multidetector CT imaging of the right hip was performed according to
the standard protocol. Multiplanar CT image reconstructions were
also generated.

[Series 4: axial st · axial · 0.39mm/px · z∈[+1076,+1176]mm · 13 of 58 slices shown, 15 images]
[im 4/58  soft-tissue]
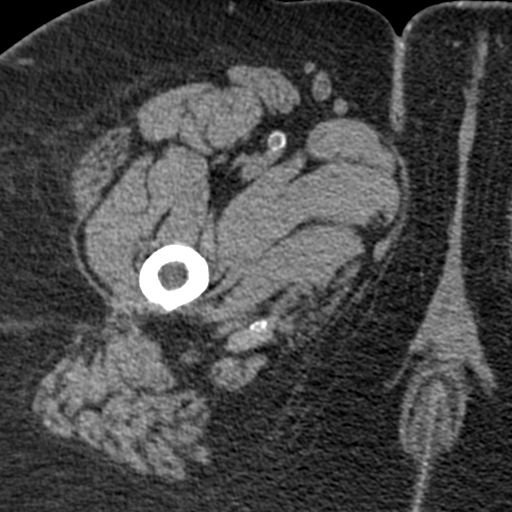
[im 4/58  bone]
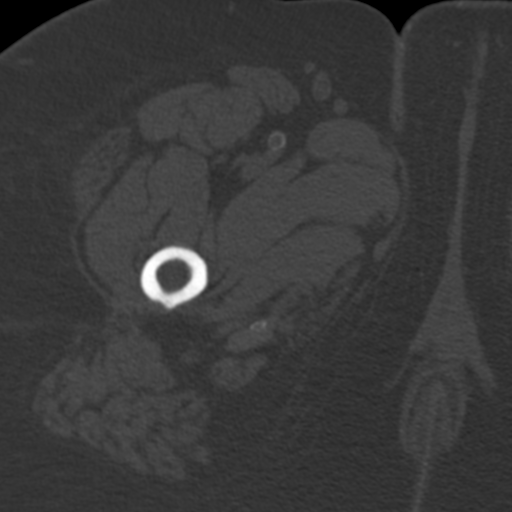
[im 8/58  soft-tissue]
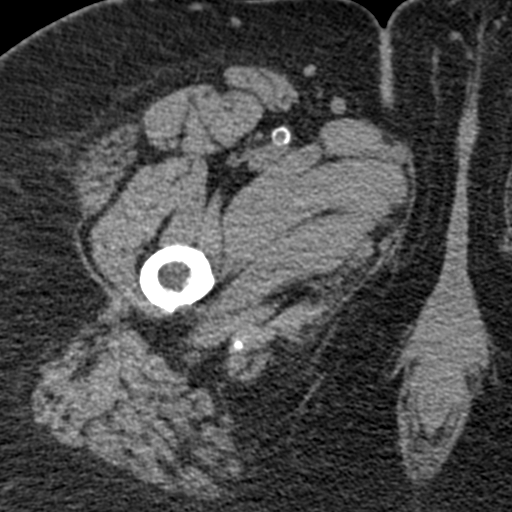
[im 12/58  soft-tissue]
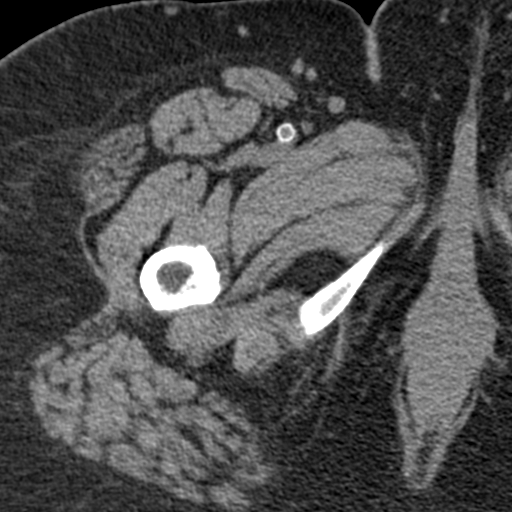
[im 17/58  soft-tissue]
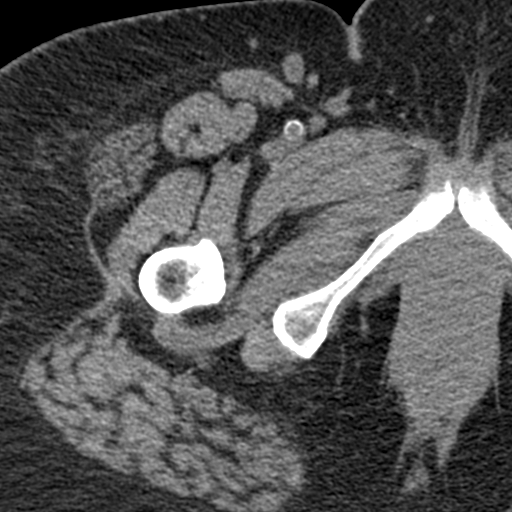
[im 21/58  soft-tissue]
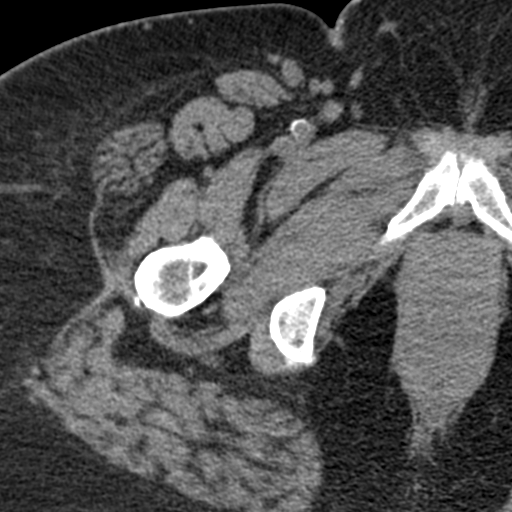
[im 24/58  soft-tissue]
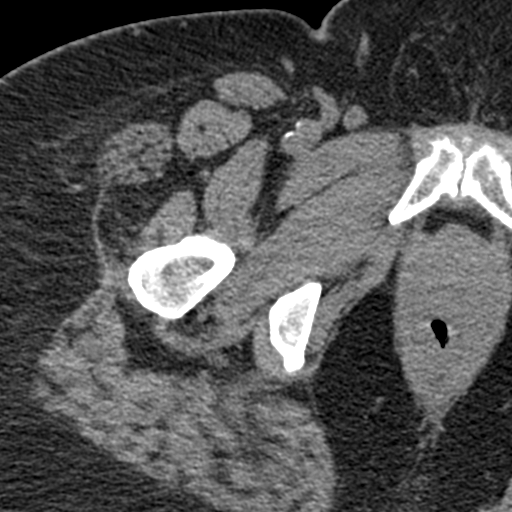
[im 30/58  soft-tissue]
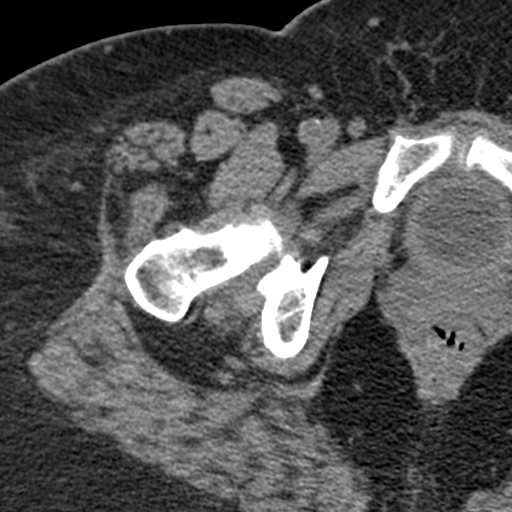
[im 34/58  soft-tissue]
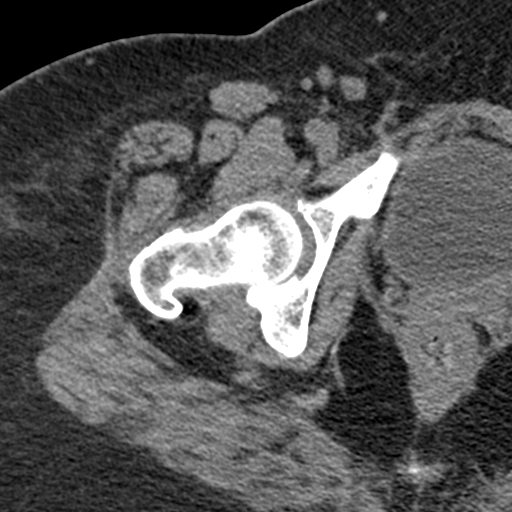
[im 37/58  soft-tissue]
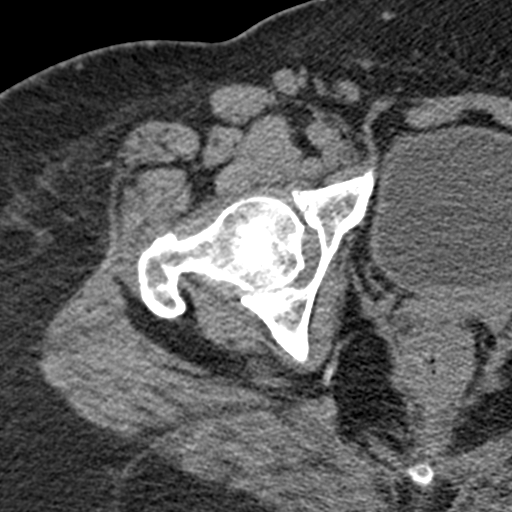
[im 37/58  bone]
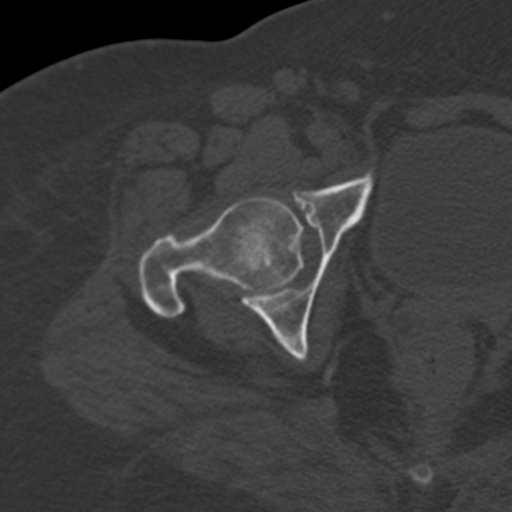
[im 41/58  soft-tissue]
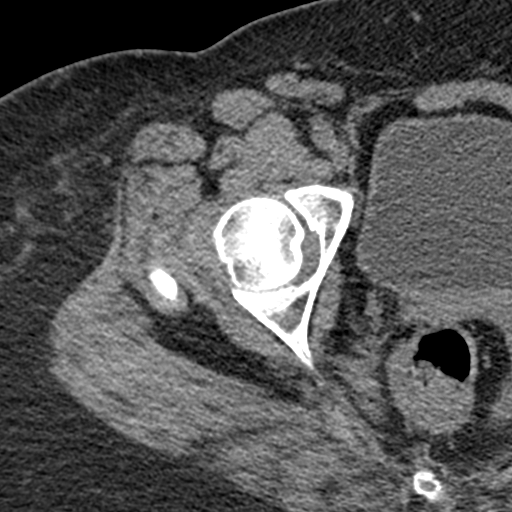
[im 46/58  soft-tissue]
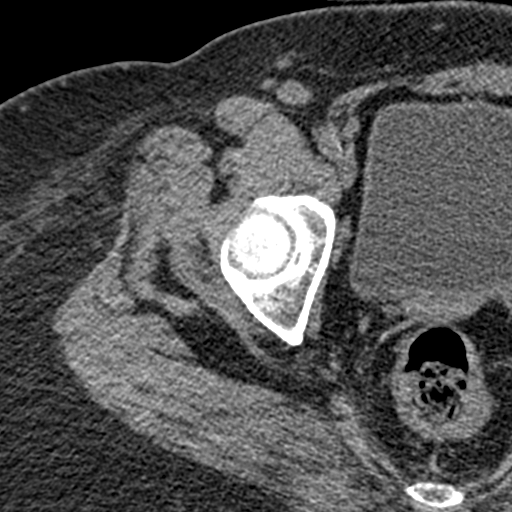
[im 50/58  soft-tissue]
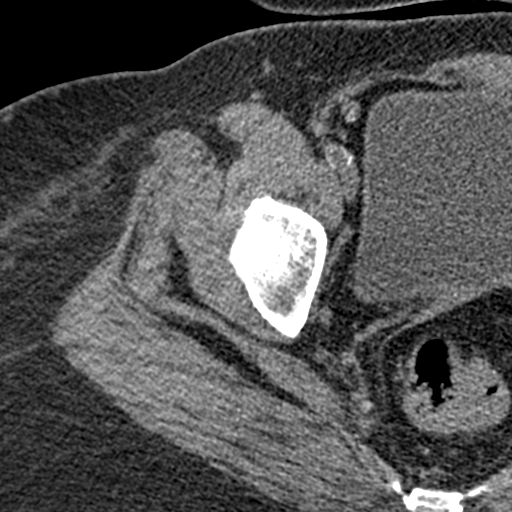
[im 54/58  soft-tissue]
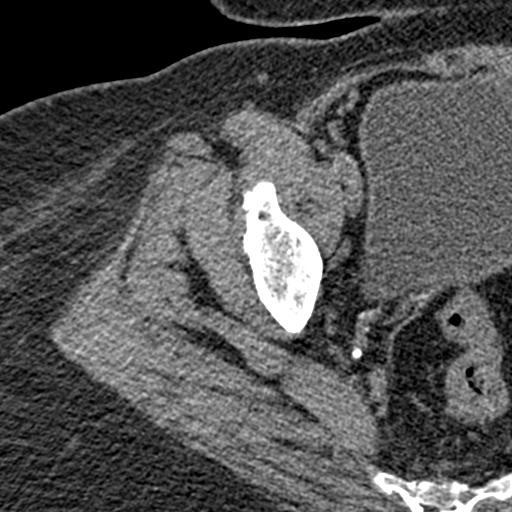

[Series 7: coronal st · coronal · 0.25mm/px · 3 of 93 slices shown]
[im 31/93  soft-tissue]
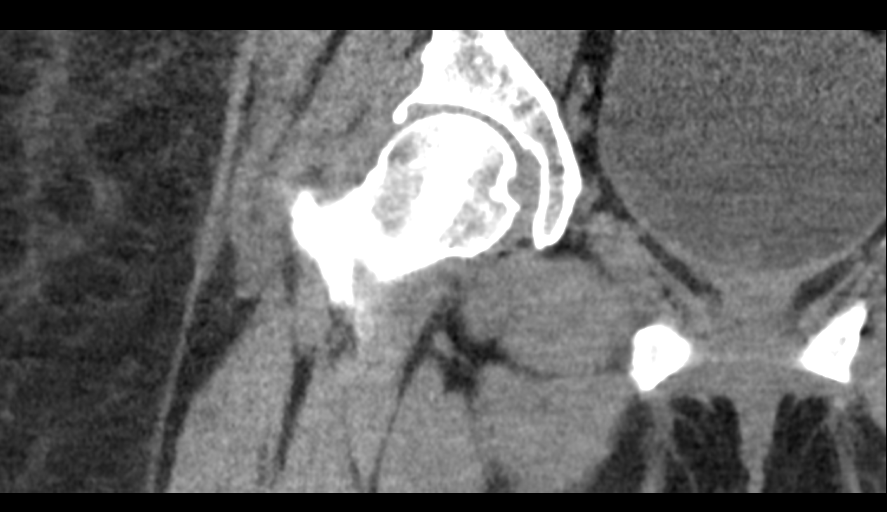
[im 41/93  soft-tissue]
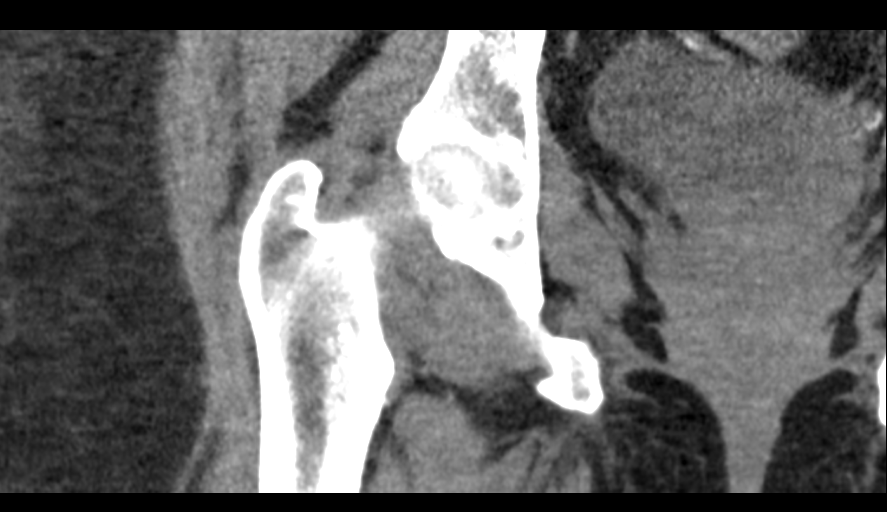
[im 52/93  soft-tissue]
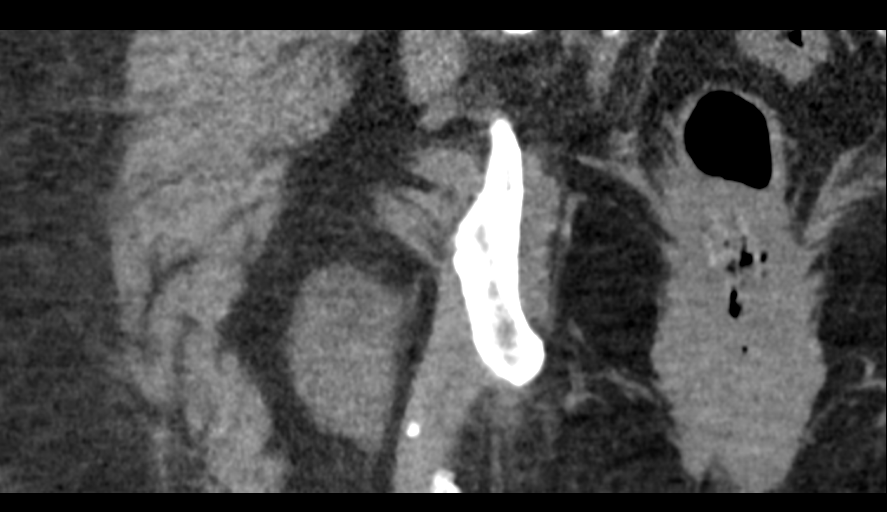

[16 of 46 positions shown; findings below may reference images not displayed]

FINDINGS: Bones/Joint/Cartilage

No acute fracture. Pubic rami are intact. Mild to moderate
osteoarthritis with acetabular spurring. No hip joint effusion.

Ligaments

Suboptimally assessed by CT.

Muscles and Tendons

No intramuscular hematoma.

Soft tissues

Mild edema in the subcutaneous tissues about the lateral hip.
Advanced vascular calcifications.
IMPRESSION: 1. Right hip osteoarthritis without acute fracture.
2. Mild patchy subcutaneous edema may represent soft tissue
contusion.

## 2020-01-15 NOTE — Telephone Encounter (Signed)
   Amy [name] DOB: [birth year] MRN: [id number]   RIDER WAIVER AND RELEASE OF LIABILITY  For purposes of improving physical access to our facilities, [facility] is pleased to partner with third parties to provide [facility] patients or other authorized individuals the option of convenient, on-demand ground transportation services (the [employer]") through use of the technology service that enables users to request on-demand ground transportation from independent third-party providers.  By opting to use and accept these [employer], I, the undersigned, hereby agree on behalf of myself, and on behalf of any minor child using the [employer] for whom I am the parent or legal guardian, as follows:  1. Government social research officer provided to me are provided by independent third-party transportation providers who are not [employer] or employees and who are unaffiliated with [employer]. 2. [facility] is neither a transportation carrier nor a common or public carrier. 3. [facility] has no control over the quality or safety of the transportation that occurs as a result of the [employer]. 4. [facility] cannot guarantee that any third-party transportation provider will complete any arranged transportation service. 5. [facility] makes no representation, warranty, or guarantee regarding the reliability, timeliness, quality, safety, suitability, or availability of any of the Transport Services or that they will be error free. 6. I fully understand that traveling by vehicle involves risks and dangers of serious bodily injury, including permanent disability, paralysis, and death. I agree, on behalf of myself and on behalf of any minor child using the Transport Services for whom I am the parent or legal guardian, that the entire risk arising out of my use of the [employer] remains solely with me, to the maximum extent permitted under applicable law. 7. The [employer] are provided "as is" and "as available." [facility] disclaims all representations and warranties, express, implied or statutory, not expressly set out in these terms, including the implied warranties of merchantability and fitness for a particular purpose. 8. I hereby waive and release [facility], its agents, employees, officers, directors, representatives, insurers, attorneys, assigns, successors, subsidiaries, and affiliates from any and all past, present, or future claims, demands, liabilities, actions, causes of action, or suits of any kind directly or indirectly arising from acceptance and use of the [employer]. 9. I further waive and release [facility] and its affiliates from all present and future liability and responsibility for any injury or death to persons or damages to property caused by or related to the use of the [employer]. 10. I have read this Waiver and Release of Liability, and I understand the terms used in it and their legal significance. This Waiver is freely and voluntarily given with the understanding that my right (as well as the right of any minor child for whom I am the parent or legal guardian using the [employer]) to legal recourse against  in connection with the [employer] is knowingly surrendered in return for use of these services.   I attest that I read the consent document to [name], gave [name] the opportunity to ask questions and answered the questions asked (if any). I affirm that [name] then provided consent for she's participation in this program.     [name]

## 2020-01-16 ENCOUNTER — Ambulatory Visit: Payer: Medicare Other | Attending: Neurosurgery | Admitting: Physical Therapy

## 2020-01-16 ENCOUNTER — Other Ambulatory Visit: Payer: Self-pay

## 2020-01-16 ENCOUNTER — Encounter: Payer: Self-pay | Admitting: Physical Therapy

## 2020-01-16 DIAGNOSIS — M5441 Lumbago with sciatica, right side: Secondary | ICD-10-CM | POA: Diagnosis present

## 2020-01-16 DIAGNOSIS — G8929 Other chronic pain: Secondary | ICD-10-CM | POA: Diagnosis present

## 2020-01-16 DIAGNOSIS — M6281 Muscle weakness (generalized): Secondary | ICD-10-CM | POA: Diagnosis present

## 2020-01-16 DIAGNOSIS — R262 Difficulty in walking, not elsewhere classified: Secondary | ICD-10-CM | POA: Diagnosis present

## 2020-01-16 NOTE — Therapy (Signed)
[facility] [place], [place], [address] Phone: [phone]   Fax:  [phone]  Physical Therapy Treatment  Patient Details  Name: Amy [name] MRN: [id number] Date of Birth: 1960/10/02 Referring Provider (PT): Leonie Green, NP   Encounter Date: 01/16/2020   PT End of Session - 01/16/20 1017    Visit Number 2    Number of Visits 12    Date for PT Re-Evaluation 02/11/20    Authorization Type [employer] MCR/MCD, recheck FOTO status by visit 6, progress note by visit 10    PT Start Time 1018    PT Stop Time 1100    PT Time Calculation (min) 42 min           Past Medical History:  Diagnosis Date  . Arthritis   . Asthma   . Blind   . Blindness and low vision    left eye glass eye,  legally blind in right eye  . Diabetes mellitus   . Diabetic neuropathy ([place])   . Hyperlipidemia   . Hypertension   . Hypothyroidism   . Spinal stenosis     Past Surgical History:  Procedure Laterality Date  . ABDOMINAL HYSTERECTOMY  1990  . [place]   x 2  . CHOLECYSTECTOMY  2008  . ENUCLEATION Bilateral 09/15/1998  . EYE SURGERY  2016   fitting artificial eye    There were no vitals filed for this visit.   Subjective Assessment - 01/16/20 1022    Subjective Pt reports pain down to foot to big toe on RT.  Pt has trouble sleeping and is on CPAP machine and only sleep 2-3 hours a night    Pertinent History visually impaired/blind, CHF, diabetic, OSA on CPAP, spinal stenosis    Currently in Pain? Yes    Pain Score 8     Pain Location Back    Pain Orientation Right                             [facility] Adult PT Treatment/Exercise - 01/16/20 0001      Lumbar Exercises: Stretches   Lower Trunk Rotation 5 reps;20 seconds   RTand LT each   Lower Trunk Rotation Limitations PT assist stretch    Piriformis Stretch 3 reps;30 seconds    Piriformis Stretch Limitations with towel asssist in supine       Lumbar Exercises: Standing   Other Standing Lumbar Exercises sink squat 2 x 10 holding onto counter and sit back in chari  then 1 x 10 sit back onto locked rollator    Other Standing Lumbar Exercises lumbar extension in standing x 10 x 2 with rest between sets.  No increase in pain on LT low back.        Lumbar Exercises: Supine   Pelvic Tilt 10 reps    Pelvic Tilt Limitations x2 VC and TC  needs extra time for correct execution    Straight Leg Raise 10 reps    Straight Leg Raises Limitations LT only but RT unable to do more than 3 in hooklying.  did heel slide for 10                   PT Education - 01/16/20 1055    Education Details added to HEP sit to stand at sink  and sleep hygiene    Person(s) Educated Patient    Methods Explanation;Demonstration;Tactile  cues;Verbal cues;Handout    Comprehension Verbalized understanding;Returned demonstration               PT Long Term Goals - 12/31/19 1129      PT LONG TERM GOAL #1   Title Independent with HEP    Baseline needs HEP    Time 6    Period Weeks    Status New    Target Date 02/11/20      PT LONG TERM GOAL #2   Title Improve FOTO outcome measure socre to 49% or less impairment    Baseline 69% limited    Time 6    Period Weeks    Status New    Target Date 02/11/20      PT LONG TERM GOAL #3   Title Tolerate right sidelying position for sleep with sleep disturbance symptoms/pain decreased 50% or greater from current status    Baseline difficulty tolerating    Time 6    Period Weeks    Status New    Target Date 02/11/20      PT LONG TERM GOAL #4   Title Tolerate sitting at least 30 min for use of transportation and sitting to eat meals with pain <5/10    Baseline 8/10 pain    Time 6    Period Weeks    Status New    Target Date 02/11/20      PT LONG TERM GOAL #5   Title Increase trunk flexion AROM at least 20-30 deg to improve ability for dressing, donning shoes    Baseline 30 deg with use  rollator for UE support    Time 6    Period Weeks    Status New    Target Date 02/11/20                 Plan - 01/16/20 1018    Clinical Impression Statement Ms Buccieri shows RT LE weakness in SLR,  Pt need much verbal cuing and encouragement to complete tasks.  Pt with sister [name] present for rX. Pt with decreased pain while doing extension exercise but also while performing sit to stand at sink with bil UE support.  Pt reinfroced HEP during RX and sleep hygiene due to pt stating.she only sleeps 2 -3 hours at night with CPAP machine    Personal Factors and Comorbidities Comorbidity 3+    Comorbidities blindness, balance issues, diabetic, CHF    Examination-Activity Limitations Bend;Lift;Squat;Sleep;Dressing;Bathing;Sit;Hygiene/Grooming;Bed Mobility;Locomotion Level;Carry;Stand    Examination-Participation Restrictions Shop;Community Activity;Cleaning;Meal Prep    PT Treatment/Interventions ADLs/Self Care Home Management;Cryotherapy;Electrical Stimulation;Moist Heat;Functional mobility training;Neuromuscular re-education;Therapeutic exercise;Patient/family education;Manual techniques;Dry needling;Therapeutic activities;Gait training    PT Next Visit Plan trial LAD right hip, pelvic titls, LTR left side emphasis, stretch piriformis, modalities prn    PT Home Exercise Plan Access code: [email or web address]    Consulted and Agree with Plan of Care Patient           Patient will benefit from skilled therapeutic intervention in order to improve the following deficits and impairments:  Pain, Impaired flexibility, Decreased strength, Decreased activity tolerance, Decreased range of motion, Decreased balance, Difficulty walking  Visit Diagnosis: Chronic right-sided low back pain with right-sided sciatica  Muscle weakness (generalized)  Difficulty in walking, not elsewhere classified     Problem List Patient Active Problem List   Diagnosis Date Noted  . Pre-operative cardiovascular  examination 02/18/2019  . Septic bursitis of elbow, right 10/30/2018  . CRI (chronic renal insufficiency), stage 3 (moderate) 07/26/2018  .  Morbid obesity ([place]) 07/26/2018  . Intractable nausea and vomiting 03/06/2018  . Right leg pain 12/19/2017  . Leukocytosis 12/19/2017  . Benign positional vertigo 12/19/2017  . Weakness 12/12/2017  . Laryngopharyngeal reflux (LPR) 10/19/2017  . Post-nasal drainage 10/19/2017  . Acute sinusitis 10/19/2017  . Degeneration of lumbar intervertebral disc 07/29/2017  . 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 Obstructive sleep apnea on CPAP 02/09/2017  . Chronic diastolic heart failure ([place]) 11/08/2016  . Near syncope 01/30/2016  . SOB (shortness of breath)   . CAP (community acquired pneumonia) 09/12/2015  . Hypoxia 09/12/2015  . Essential hypertension 07/05/2015  . Hypotension 07/05/2015  . Diabetes mellitus with neurological manifestations ([place]) 11/04/2014  . Hyperlipidemia LDL goal <70 11/04/2014  . Spinal stenosis, multilevel 11/04/2014  . Hyperkalemia 11/01/2014  . Hypoglycemia 08/09/2014  . Type 2 diabetes mellitus with complication, with long-term current use of insulin ([place]) 08/08/2014  . Elevated troponin 08/08/2014  . Nausea vomiting and diarrhea 08/08/2014  . 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 Dizziness 08/06/2012  . Orthostatic hypotension 08/06/2012  . Hypernatremia 08/06/2012  . Acute gastroenteritis 08/13/2011  . Gastroparesis 08/13/2011  . Low back pain 08/13/2011  . Oral thrush 08/13/2011  . Gastroenteritis 05/23/2011  . Dehydration 05/23/2011  . Blindness 05/23/2011  . DM type 1, not at goal, causing eye disease ([place]) 05/23/2011  . Asthma 05/23/2011  . Diarrhea 05/23/2011  . Vomiting 05/23/2011  . Hypothyroidism 05/23/2011  . Diabetic neuropathy ([place]) 05/23/2011  .  Diabetic nephropathy ([place]) 05/23/2011    Voncille Lo, PT Certified Exercise Expert for the Aging Adult  01/16/20 12:18 PM Phone: [phone] Fax: [facility] [address] [place], [place], [address] Phone: [phone]   Fax:  [phone]  Name: JISSELL TRAFTON MRN: [id number] Date of Birth: [birth year]

## 2020-01-16 NOTE — Patient Instructions (Addendum)
Sleep Tips    .Keep a consistent sleep schedule. Get up at the same time every day, even on weekends or during vacations. .Set a bedtime that is early enough for you to get at least 7 hours of sleep. At least 7-9 hours .Don't go to bed unless you are sleepy.  .If you don't fall asleep after 20 minutes, get out of bed.  .Establish a relaxing bedtime routine.  .Use your bed only for sleep and sex.  .Make your bedroom quiet and relaxing. Keep the room at a comfortable, cool temperature.  .Limit exposure to bright light in the evenings. .Turn off electronic devices at least 30 minutes before bedtime. .Don't eat a large meal before bedtime. If you are hungry at night, eat a light, healthy snack.  .Exercise regularly and maintain a healthy diet.  Marland KitchenAvoid consuming caffeine in the late afternoon or evening.  .Avoid consuming alcohol before bedtime.  .Reduce your fluid intake before bedtime.    Voncille Lo, PT Certified Exercise Expert for the Aging Adult  01/16/20 10:55 AM Phone: [phone] Fax: [phone]

## 2020-01-21 ENCOUNTER — Other Ambulatory Visit: Payer: Self-pay

## 2020-01-21 ENCOUNTER — Ambulatory Visit: Payer: Medicare Other | Admitting: Physical Therapy

## 2020-01-21 ENCOUNTER — Encounter: Payer: Self-pay | Admitting: Physical Therapy

## 2020-01-21 DIAGNOSIS — R262 Difficulty in walking, not elsewhere classified: Secondary | ICD-10-CM

## 2020-01-21 DIAGNOSIS — G8929 Other chronic pain: Secondary | ICD-10-CM

## 2020-01-21 DIAGNOSIS — M5441 Lumbago with sciatica, right side: Secondary | ICD-10-CM | POA: Diagnosis not present

## 2020-01-21 DIAGNOSIS — M6281 Muscle weakness (generalized): Secondary | ICD-10-CM

## 2020-01-21 NOTE — Therapy (Signed)
[place], [place], [address] Phone: [phone]   Fax:  [phone]  Physical Therapy Treatment  Patient Details  Name: Amy [name] MRN: [id number] Date of Birth: January 23, 1961 Referring Provider (PT): Leonie Green, NP   Encounter Date: 01/21/2020   PT End of Session - 01/21/20 1201    Visit Number 3    Number of Visits 12    Date for PT Re-Evaluation 02/11/20    Authorization Type [employer] MCR/MCD, recheck FOTO status by visit 6, progress note by visit 10    PT Start Time 1147    PT Stop Time 1235    PT Time Calculation (min) 48 min    Activity Tolerance Patient limited by pain    Behavior During Therapy [facility] for tasks assessed/performed           Past Medical History:  Diagnosis Date  . Arthritis   . Asthma   . Blind   . Blindness and low vision    left eye glass eye,  legally blind in right eye  . Diabetes mellitus   . Diabetic neuropathy ([place])   . Hyperlipidemia   . Hypertension   . Hypothyroidism   . Spinal stenosis     Past Surgical History:  Procedure Laterality Date  . ABDOMINAL HYSTERECTOMY  1990  . [place]   x 2  . CHOLECYSTECTOMY  2008  . ENUCLEATION Bilateral 09/15/1998  . EYE SURGERY  2016   fitting artificial eye    There were no vitals filed for this visit.   Subjective Assessment - 01/21/20 1153    Subjective I am mostly hurting on my left arm at the elbow to my hand today.  I dont know why it just hurts today. 9/10   but I am here for my legs.  I am waiting for the hand doctor to give me a call. I wasnt able to do my exercises because my LT arm hurt so bad    Patient is accompained by: --   aide is oresebt with baby in [place]   Pertinent History visually impaired/blind, CHF, diabetic, OSA on CPAP, spinal stenosis    Limitations House hold activities;Lifting;Sitting;Standing;Walking    Diagnostic tests CT for abdominal region in July but no  recent lumbar imaging    Patient Stated Goals Get back/leg better    Currently in Pain? Yes    Pain Score 7     Pain Location Back    Pain Orientation Right    Pain Descriptors / Indicators Sharp    Pain Type Chronic pain    Pain Onset More than a month ago    Pain Frequency Intermittent                             [facility] Adult PT Treatment/Exercise - 01/21/20 0001      Transfers   Supine to Sit 3: Mod assist    Supine to Sit Details (indicate cue type and reason) x2   needs help for legs   Supine to Sit Details Tactile cues for initiation;Tactile cues for weight shifting    Sit to Supine 3: Mod assist    Sit to Supine Details (indicate cue type and reason) Tactile cues for weight shifting;Tactile cues for weight beaing   x2   Sit to Supine Details  Pt with LT UE pain and could not bear wt in  LT elbow      Lumbar Exercises: Stretches   Lower Trunk Rotation 5 reps;20 seconds   RTand LT each   Lower Trunk Rotation Limitations PT assist stretch    Piriformis Stretch 3 reps;30 seconds    Piriformis Stretch Limitations with towel asssist in supine      Lumbar Exercises: Standing   Other Standing Lumbar Exercises sink squat with one hand due to LT hand not able to help due to pain. 2 x 5    Other Standing Lumbar Exercises lumbar extension in standing x 10 x 2 with rest between sets.  No increase in pain on LT low back.        Lumbar Exercises: Supine   Pelvic Tilt 10 reps    Pelvic Tilt Limitations x2 VC and TC  needs extra time for correct execution    Straight Leg Raise 10 reps    Straight Leg Raises Limitations x2 RT and LT 10 each      Modalities   Modalities Moist Heat      Moist Heat Therapy   Number Minutes Moist Heat 12 Minutes    Moist Heat Location Hip   RT     Manual Therapy   Manual Therapy Joint mobilization;Soft tissue mobilization    Manual therapy comments skilled palpation with TPDN    Joint Mobilization LAD RT leg  AP hip grade 3/4     Soft tissue mobilization Rt hip/gluts and piriformis            Trigger Point Dry Needling - 01/21/20 0001    Consent Given? Yes    Education Handout Provided Previously provided   read out loud   Muscles Treated Back/Hip Gluteus medius;Gluteus maximus;Piriformis   RT only   Dry Needling Comments 100 mm .40 gage    Gluteus Medius Response Twitch response elicited;Palpable increased muscle length    Gluteus Maximus Response Twitch response elicited;Palpable increased muscle length    Piriformis Response Twitch response elicited;Palpable increased muscle length                PT Education - 01/21/20 1229    Education Details Pt educated on TPDN and aftercare and precautians  read aloud sue to blindness.  Added marching supine to HEP    Person(s) Educated Patient    Methods Explanation;Demonstration;Tactile cues;Verbal cues;Handout    Comprehension Verbalized understanding;Returned demonstration               PT Long Term Goals - 01/21/20 1158      PT LONG TERM GOAL #1   Title Independent with HEP    Baseline pt is independent with initial HEP    Time 6    Period Weeks    Status On-going      PT LONG TERM GOAL #2   Title Improve FOTO outcome measure socre to 49% or less impairment    Baseline 69% limited taken 3 weeks ago    Time 6    Period Weeks    Status On-going      PT LONG TERM GOAL #3   Title Tolerate right sidelying position for sleep with sleep disturbance symptoms/pain decreased 50% or greater from current status    Baseline difficulty tolerating    Time 6    Period Weeks    Status On-going      PT LONG TERM GOAL #4   Title Tolerate sitting at least 30 min for use of transportation and sitting to eat meals with pain <  5/10    Baseline Pt today 7/10 pain    Time 6    Period Weeks    Status On-going      PT LONG TERM GOAL #5   Title Increase trunk flexion AROM at least 20-30 deg to improve ability for dressing, donning shoes    Baseline Working  on piriformis stretch in supine to progress to sitting    Time 6    Period Weeks    Status On-going                 Plan - 01/21/20 1156    Clinical Impression Statement Pt is independent with intiial HEP but no other goals met. Pt comes to clinic with 9/10 LT ue pain that she is having doctor evaluate.  She is unable to use LT UE to use rollator and needs PT to guide due to blindness and inablitly to use LT UE  today. Pt complains of 7/10 pain in RT hip and consents to TPDN of RT hip with manual.  Pt pain reduced to 5/10 post RX.  Added marching in supine.  Pt needs to continue moving and doing basic home program.  Pt needs to demonstrate consistency to achieve goals.  Willl continue POC    Personal Factors and Comorbidities Comorbidity 3+    Comorbidities blindness, balance issues, diabetic, CHF    Examination-Activity Limitations Bend;Lift;Squat;Sleep;Dressing;Bathing;Sit;Hygiene/Grooming;Bed Mobility;Locomotion Level;Carry;Stand    Examination-Participation Restrictions Shop;Community Activity;Cleaning;Meal Prep    PT Frequency 2x / week    PT Duration 6 weeks    PT Treatment/Interventions ADLs/Self Care Home Management;Cryotherapy;Electrical Stimulation;Moist Heat;Functional mobility training;Neuromuscular re-education;Therapeutic exercise;Patient/family education;Manual techniques;Dry needling;Therapeutic activities;Gait training    PT Next Visit Plan trial LAD right hip, pelvic titls, LTR left side emphasis, stretch piriformis, modalities prn    PT Home Exercise Plan [email or web address]    Consulted and Agree with Plan of Care Patient;Other (Comment)   care giver in foyer          Patient will benefit from skilled therapeutic intervention in order to improve the following deficits and impairments:  Pain, Impaired flexibility, Decreased strength, Decreased activity tolerance, Decreased range of motion, Decreased balance, Difficulty walking  Visit Diagnosis: Chronic right-sided low back  pain with right-sided sciatica  Muscle weakness (generalized)  Difficulty in walking, not elsewhere classified     Problem List Patient Active Problem List   Diagnosis Date Noted  . Pre-operative cardiovascular examination 02/18/2019  . Septic bursitis of elbow, right 10/30/2018  . CRI (chronic renal insufficiency), stage 3 (moderate) 07/26/2018  . Morbid obesity ([place]) 07/26/2018  . Intractable nausea and vomiting 03/06/2018  . Right leg pain 12/19/2017  . Leukocytosis 12/19/2017  . Benign positional vertigo 12/19/2017  . Weakness 12/12/2017  . Laryngopharyngeal reflux (LPR) 10/19/2017  . Post-nasal drainage 10/19/2017  . Acute sinusitis 10/19/2017  . Degeneration of lumbar intervertebral disc 07/29/2017  . 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 Obstructive sleep apnea on CPAP 02/09/2017  . Chronic diastolic heart failure ([place]) 11/08/2016  . Near syncope 01/30/2016  . SOB (shortness of breath)   . CAP (community acquired pneumonia) 09/12/2015  . Hypoxia 09/12/2015  . Essential hypertension 07/05/2015  . Hypotension 07/05/2015  . Diabetes mellitus with neurological manifestations ([place]) 11/04/2014  . Hyperlipidemia LDL goal <70 11/04/2014  . Spinal stenosis, multilevel 11/04/2014  . Hyperkalemia 11/01/2014  . Hypoglycemia 08/09/2014  . Type 2 diabetes mellitus  with complication, with long-term current use of insulin ([place]) 08/08/2014  . Elevated troponin 08/08/2014  . Nausea vomiting and diarrhea 08/08/2014  . 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 Dizziness 08/06/2012  . Orthostatic hypotension 08/06/2012  . Hypernatremia 08/06/2012  . Acute gastroenteritis 08/13/2011  . Gastroparesis 08/13/2011  . Low back pain 08/13/2011  . Oral thrush 08/13/2011  . Gastroenteritis 05/23/2011  . Dehydration 05/23/2011  . Blindness  05/23/2011  . DM type 1, not at goal, causing eye disease ([place]) 05/23/2011  . Asthma 05/23/2011  . Diarrhea 05/23/2011  . Vomiting 05/23/2011  . Hypothyroidism 05/23/2011  . Diabetic neuropathy ([place]) 05/23/2011  . Diabetic nephropathy ([place]) 05/23/2011    Voncille Lo, PT, [place] Certified Exercise Expert for the Aging Adult  01/21/20 1:12 PM Phone: [phone] Fax: [place] [facility] [address] [place], [place], [address] Phone: [phone]   Fax:  [phone]  Name: Amy [name] MRN: [id number] Date of Birth: July 20, 1960

## 2020-01-21 NOTE — Patient Instructions (Addendum)
Trigger Point Dry Needling Read out loud for Ms  Harnack   What is Trigger Point Dry Needling (DN)? o DN is a physical therapy technique used to treat muscle pain and dysfunction. Specifically, DN helps deactivate muscle trigger points (muscle knots).  o A thin filiform needle is used to penetrate the skin and stimulate the underlying trigger point. The goal is for a local twitch response (LTR) to occur and for the trigger point to relax. No medication of any kind is injected during the procedure.    What Does Trigger Point Dry Needling Feel Like?  o The procedure feels different for each individual patient. Some patients report that they do not actually feel the needle enter the skin and overall the process is not painful. Very mild bleeding may occur. However, many patients feel a deep cramping in the muscle in which the needle was inserted. This is the local twitch response.    How Will I feel after the treatment? o Soreness is normal, and the onset of soreness may not occur for a few hours. Typically this soreness does not last longer than two days.  o Bruising is uncommon, however; ice can be used to decrease any possible bruising.  o In rare cases feeling tired or nauseous after the treatment is normal. In addition, your symptoms may get worse before they get better, this period will typically not last longer than 24 hours.    What Can I do After My Treatment? o Increase your hydration by drinking more water for the next 24 hours. o You may place ice or heat on the areas treated that have become sore, however, do not use heat on inflamed or bruised areas. Heat often brings more relief post needling. o You can continue your regular activities, but vigorous activity is not recommended initially after the treatment for 24 hours. o DN is best combined with other physical therapy such as strengthening, stretching, and other therapies.    Voncille Lo, PT Certified Exercise Expert for  the Aging Adult  01/21/20 12:29 PM Phone: [phone] Fax: [phone]

## 2020-01-23 ENCOUNTER — Ambulatory Visit: Payer: Medicare Other | Admitting: Physical Therapy

## 2020-01-27 ENCOUNTER — Emergency Department (HOSPITAL_COMMUNITY)
Admission: EM | Admit: 2020-01-27 | Discharge: 2020-01-28 | Disposition: A | Discharge status: Home / Self Care | Payer: Medicare Other | Attending: Emergency Medicine | Admitting: Emergency Medicine

## 2020-01-27 ENCOUNTER — Encounter (HOSPITAL_COMMUNITY): Payer: Self-pay

## 2020-01-27 DIAGNOSIS — E039 Hypothyroidism, unspecified: Secondary | ICD-10-CM | POA: Diagnosis not present

## 2020-01-27 DIAGNOSIS — D649 Anemia, unspecified: Secondary | ICD-10-CM | POA: Diagnosis not present

## 2020-01-27 DIAGNOSIS — Z794 Long term (current) use of insulin: Secondary | ICD-10-CM | POA: Insufficient documentation

## 2020-01-27 DIAGNOSIS — E1121 Type 2 diabetes mellitus with diabetic nephropathy: Secondary | ICD-10-CM | POA: Diagnosis not present

## 2020-01-27 DIAGNOSIS — J45909 Unspecified asthma, uncomplicated: Secondary | ICD-10-CM | POA: Diagnosis not present

## 2020-01-27 DIAGNOSIS — N289 Disorder of kidney and ureter, unspecified: Secondary | ICD-10-CM | POA: Diagnosis not present

## 2020-01-27 DIAGNOSIS — Z79899 Other long term (current) drug therapy: Secondary | ICD-10-CM | POA: Insufficient documentation

## 2020-01-27 DIAGNOSIS — Z7951 Long term (current) use of inhaled steroids: Secondary | ICD-10-CM | POA: Insufficient documentation

## 2020-01-27 DIAGNOSIS — Z87891 Personal history of nicotine dependence: Secondary | ICD-10-CM | POA: Diagnosis not present

## 2020-01-27 DIAGNOSIS — I110 Hypertensive heart disease with heart failure: Secondary | ICD-10-CM | POA: Insufficient documentation

## 2020-01-27 DIAGNOSIS — I5032 Chronic diastolic (congestive) heart failure: Secondary | ICD-10-CM | POA: Insufficient documentation

## 2020-01-27 DIAGNOSIS — E1140 Type 2 diabetes mellitus with diabetic neuropathy, unspecified: Secondary | ICD-10-CM | POA: Diagnosis not present

## 2020-01-27 DIAGNOSIS — I959 Hypotension, unspecified: Secondary | ICD-10-CM | POA: Diagnosis present

## 2020-01-27 DIAGNOSIS — Z7982 Long term (current) use of aspirin: Secondary | ICD-10-CM | POA: Insufficient documentation

## 2020-01-27 LAB — CBC
HCT: 35.9 % — ABNORMAL LOW (ref 36.0–46.0)
Hemoglobin: 11 g/dL — ABNORMAL LOW (ref 12.0–15.0)
MCH: 25.3 pg — ABNORMAL LOW (ref 26.0–34.0)
MCHC: 30.6 g/dL (ref 30.0–36.0)
MCV: 82.7 fL (ref 80.0–100.0)
Platelets: 184 10*3/uL (ref 150–400)
RBC: 4.34 MIL/uL (ref 3.87–5.11)
RDW: 16.1 % — ABNORMAL HIGH (ref 11.5–15.5)
WBC: 10 10*3/uL (ref 4.0–10.5)
nRBC: 0 % (ref 0.0–0.2)

## 2020-01-27 LAB — BASIC METABOLIC PANEL
Anion gap: 11 (ref 5–15)
BUN: 35 mg/dL — ABNORMAL HIGH (ref 6–20)
CO2: 28 mmol/L (ref 22–32)
Calcium: 8.3 mg/dL — ABNORMAL LOW (ref 8.9–10.3)
Chloride: 102 mmol/L (ref 98–111)
Creatinine, Ser: 2.37 mg/dL — ABNORMAL HIGH (ref 0.44–1.00)
GFR calc Af Amer: 25 mL/min — ABNORMAL LOW (ref >60)
GFR calc non Af Amer: 22 mL/min — ABNORMAL LOW (ref >60)
Glucose, Bld: 314 mg/dL — ABNORMAL HIGH (ref 70–99)
Potassium: 4 mmol/L (ref 3.5–5.1)
Sodium: 141 mmol/L (ref 135–145)

## 2020-01-27 NOTE — ED Triage Notes (Addendum)
Pt bib [facility] from home w/ c/o hypotension 60/30 w/ home health nurse. On EMS arrival 100/60 with no intervention. Pt endorses lethargy, diarrhea. Pt denies pain, sob.Pt is blind. EMS VSS at present. Pt received 500 mL NS w/ EMS.

## 2020-01-28 ENCOUNTER — Ambulatory Visit: Payer: Medicare Other | Admitting: Physical Therapy

## 2020-01-28 NOTE — ED Provider Notes (Signed)
[facility] EMERGENCY DEPARTMENT Provider Note   CSN: [id number] Arrival date & time: 01/27/20  1552   History Chief Complaint  Patient presents with  . Hypotension    Amy [name] is a 59 y.o. female.  The history is provided by the patient and the EMS personnel.  She has history of hypertension, diabetes, hyperlipidemia, blindness, chronic kidney disease and was sent here because of low blood pressure.  She states that at home, she just did not feel well.  She felt generally weak and may be a little lightheaded.  Home health nurse checked her blood pressure and found it was 60/30.  It was checked multiple times and was consistent.  EMS was called and noted initial blood pressure of 100/60.  She did receive 500 mL of fluid in the ambulance.  In the ED, she is actually feeling better.  She denies nausea, vomiting, diarrhea.  She denies chest pain.  She denies fever, chills.  Past Medical History:  Diagnosis Date  . Arthritis   . Asthma   . Blind   . Blindness and low vision    left eye glass eye,  legally blind in right eye  . Diabetes mellitus   . Diabetic neuropathy ([place])   . Hyperlipidemia   . Hypertension   . Hypothyroidism   . Spinal stenosis     Patient Active Problem List   Diagnosis Date Noted  . Pre-operative cardiovascular examination 02/18/2019  . Septic bursitis of elbow, right 10/30/2018  . CRI (chronic renal insufficiency), stage 3 (moderate) 07/26/2018  . Morbid obesity ([place]) 07/26/2018  . Intractable nausea and vomiting 03/06/2018  . Right leg pain 12/19/2017  . Leukocytosis 12/19/2017  . Benign positional vertigo 12/19/2017  . Weakness 12/12/2017  . Laryngopharyngeal reflux (LPR) 10/19/2017  . Post-nasal drainage 10/19/2017  . Acute sinusitis 10/19/2017  . Degeneration of lumbar intervertebral disc 07/29/2017  . 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 Obstructive sleep apnea on CPAP 02/09/2017  . Chronic diastolic heart failure ([place]) 11/08/2016  . Near syncope 01/30/2016  . SOB (shortness of breath)   . CAP (community acquired pneumonia) 09/12/2015  . Hypoxia 09/12/2015  . Essential hypertension 07/05/2015  . Hypotension 07/05/2015  . Diabetes mellitus with neurological manifestations ([place]) 11/04/2014  . Hyperlipidemia LDL goal <70 11/04/2014  . Spinal stenosis, multilevel 11/04/2014  . Hyperkalemia 11/01/2014  . Hypoglycemia 08/09/2014  . Type 2 diabetes mellitus with complication, with long-term current use of insulin ([place]) 08/08/2014  . Elevated troponin 08/08/2014  . Nausea vomiting and diarrhea 08/08/2014  . 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 Dizziness 08/06/2012  . Orthostatic hypotension 08/06/2012  . Hypernatremia 08/06/2012  . Acute gastroenteritis 08/13/2011  . Gastroparesis 08/13/2011  . Low back pain 08/13/2011  . Oral thrush 08/13/2011  . Gastroenteritis 05/23/2011  . Dehydration 05/23/2011  . Blindness 05/23/2011  . DM type 1, not at goal, causing eye disease ([place]) 05/23/2011  . Asthma 05/23/2011  . Diarrhea 05/23/2011  . Vomiting 05/23/2011  . Hypothyroidism 05/23/2011  . Diabetic neuropathy ([place]) 05/23/2011  . Diabetic nephropathy ([place]) 05/23/2011    Past Surgical History:  Procedure Laterality Date  . ABDOMINAL HYSTERECTOMY  1990  . [place]   x 2  . CHOLECYSTECTOMY  2008  . ENUCLEATION Bilateral 09/15/1998  . EYE SURGERY  2016  fitting artificial eye     OB History   No obstetric history on file.    Obstetric Comments  Pt has two sons.        Family History  Problem Relation Age of Onset  . Diabetes Sister   . Glaucoma Sister   . Hypertension Sister   . Healthy Mother   . Healthy Father   . Stroke Brother   . Heart attack Paternal Aunt   . Breast cancer Neg Hx     Social History    Tobacco Use  . Smoking status: Former Smoker    Types: Cigarettes    Quit date: 05/22/1992    Years since quitting: 27.7  . Smokeless tobacco: Never Used  Vaping Use  . Vaping Use: Never used  Substance Use Topics  . Alcohol use: No    Comment: quit 20 yrs ago  . Drug use: No    Home Medications Prior to Admission medications   Medication Sig Start Date End Date Taking? Authorizing Provider  albuterol (PROVENTIL HFA;VENTOLIN HFA) 108 (90 BASE) MCG/ACT inhaler Inhale 2 puffs into the lungs every 4 (four) hours as needed for wheezing or shortness of breath. For short    [provider]  ammonium lactate (AMLACTIN) 12 % lotion Apply 1 application topically as needed for dry skin. Patient not taking: Reported on 11/23/2019 06/14/19   Felipa Furnace, DPM  aspirin EC 81 MG tablet Take 81 mg by mouth daily.    [provider]  B-D UF III MINI PEN NEEDLES 31G X 5 MM MISC U 1 PEN NEEDLE QID WITH EACH INJECTION 08/01/18   [provider]  DOXYCYCLINE HYCLATE PO Take by mouth.    [provider]  esomeprazole (NEXIUM) 40 MG capsule Take 40 mg by mouth daily.     [provider]  furosemide (LASIX) 80 MG tablet Take 80 mg by mouth daily.    [provider]  HUMALOG KWIKPEN 100 UNIT/ML KiwkPen Inject 8-14 Units into the skin See admin instructions. Inject 8 units in morning, 12 units at lunch, and 14 units at dinner 01/24/17   [provider]  levothyroxine (SYNTHROID, LEVOTHROID) 150 MCG tablet Take 150 mcg by mouth daily before breakfast.    [provider]  Linaclotide (LINZESS) 145 MCG CAPS capsule Take 145 mcg by mouth daily as needed (constipation).     [provider]  methocarbamol (ROBAXIN) 500 MG tablet Take 1 tablet (500 mg total) by mouth every 12 (twelve) hours as needed for muscle spasms. Patient not taking: Reported on 11/23/2019 07/09/19   Antonietta Breach, PA-C  metolazone (ZAROXOLYN) 2.5 MG tablet Take 1  tablet (2.5 mg total) by mouth daily. 07/26/18   Erlene Quan, PA-C  naproxen (NAPROSYN) 500 MG tablet Take 500 mg by mouth 2 (two) times daily with a meal.    [provider]  ondansetron (ZOFRAN ODT) 4 MG disintegrating tablet Take 1 tablet (4 mg total) by mouth every 8 (eight) hours as needed for nausea or vomiting. 07/09/19   Antonietta Breach, PA-C  potassium chloride SA (KLOR-CON) 20 MEQ tablet TAKE 1 TABLET BY MOUTH DAILY 12/02/19   Larey Dresser, MD  pregabalin (LYRICA) 150 MG capsule Take 150 mg by mouth 3 (three) times daily.  01/01/18   [provider]  simvastatin (ZOCOR) 20 MG tablet Take 20 mg by mouth every evening.     [provider]  spironolactone (ALDACTONE) 25 MG tablet TAKE 1/2 TABLET  BY MOUTH DAILY Patient taking differently: Take 12.5 mg by mouth once.  01/30/19   Bensimhon, Shaune Pascal, MD  torsemide (DEMADEX) 20 MG tablet TAKE 4 TABLETS (80 MG TOTAL) BY MOUTH DAILY. 12/02/19   Larey Dresser, MD  TRESIBA FLEXTOUCH 200 UNIT/ML SOPN Inject 60 Units into the skin at bedtime.  05/30/18   [provider]    Allergies    Penicillins, Tramadol, Vicodin [hydrocodone-acetaminophen], Codeine, Iodine-131, Iohexol, Lisinopril, and Sulfa antibiotics  Review of Systems   Review of Systems  All other systems reviewed and are negative.   Physical Exam Updated Vital Signs BP 126/83 (BP Location: Right Arm)   Pulse 66   Temp 98 F (36.7 C) (Oral)   Resp 18   SpO2 94%   Physical Exam Vitals and nursing note reviewed.   59 year old female, resting comfortably and in no acute distress. Vital signs are normal. Oxygen saturation is 94%, which is normal. Head is normocephalic and atraumatic. Oropharynx is clear. Neck is nontender and supple without adenopathy or JVD. Back is nontender and there is no CVA tenderness. Lungs are clear without rales, wheezes, or rhonchi. Chest is nontender. Heart has regular rate and rhythm without murmur. Abdomen is  soft, flat, nontender without masses or hepatosplenomegaly and peristalsis is normoactive. Extremities have 1-2+ edema, full range of motion is present. Skin is warm and dry without rash. Neurologic: Mental status is normal, cranial nerves are intact, there are no motor or sensory deficits.   ED Results / Procedures / Treatments   Labs (all labs ordered are listed, but only abnormal results are displayed) Labs Reviewed  CBC - Abnormal; Notable for the following components:      Result Value   Hemoglobin 11.0 (*)    HCT 35.9 (*)    MCH 25.3 (*)    RDW 16.1 (*)    All other components within normal limits  BASIC METABOLIC PANEL - Abnormal; Notable for the following components:   Glucose, Bld 314 (*)    BUN 35 (*)    Creatinine, Ser 2.37 (*)    Calcium 8.3 (*)    GFR calc non Af Amer 22 (*)    GFR calc Af Amer 25 (*)    All other components within normal limits   Procedures Procedures   Medications Ordered in ED Medications - No data to display  ED Course  I have reviewed the triage vital signs and the nursing notes.  Pertinent lab results that were available during my care of the patient were reviewed by me and considered in my medical decision making (see chart for details).  MDM Rules/Calculators/A&P Transient hypotension which has resolved.  Labs are significant for mild anemia which is stable.  Also, creatinine is noted to be elevated with some increase over baseline.  Creatinine today is 2.37 compared with 1.78 on 7/9.  On further review of past records, creatinine has been as high as 2.26 in the past.  Since she is normotensive now, I am reluctant to give additional fluid given her edema.  We will check orthostatic vital signs.  She will need close follow-up regarding her creatinine.  Orthostatic vital signs are unremarkable.  She will be discharged with instructions to follow-up with her PCP in 1 week to recheck her creatinine.  Return precautions discussed.  Final  Clinical Impression(s) / ED Diagnoses Final diagnoses:  Hypotension, unspecified hypotension type  Renal insufficiency  Normocytic anemia    Rx / DC Orders ED Discharge  Orders    None       Delora Fuel, MD 35/36/14 [phone]

## 2020-01-28 NOTE — Discharge Instructions (Signed)
Return if you are having any problems. 

## 2020-02-04 ENCOUNTER — Ambulatory Visit: Payer: Medicare Other | Admitting: Physical Therapy

## 2020-02-04 ENCOUNTER — Other Ambulatory Visit: Payer: Self-pay

## 2020-02-04 DIAGNOSIS — R262 Difficulty in walking, not elsewhere classified: Secondary | ICD-10-CM

## 2020-02-04 DIAGNOSIS — M5441 Lumbago with sciatica, right side: Secondary | ICD-10-CM | POA: Diagnosis not present

## 2020-02-04 DIAGNOSIS — M6281 Muscle weakness (generalized): Secondary | ICD-10-CM

## 2020-02-04 DIAGNOSIS — G8929 Other chronic pain: Secondary | ICD-10-CM

## 2020-02-04 NOTE — Therapy (Signed)
[place], [place], [address] Phone: [phone]   Fax:  [phone]  Physical Therapy Treatment  Patient Details  Name: Amy [name] MRN: [id number] Date of Birth: 1960-08-16 Referring Provider (PT): Leonie Green, NP   Encounter Date: 02/04/2020   PT End of Session - 02/04/20 0933    Visit Number 4    Number of Visits 12    Date for PT Re-Evaluation 02/11/20    Authorization Type UHC MCR/MCD, recheck FOTO status by visit 6, progress note by visit 10    PT Start Time 0933    PT Stop Time 1015    PT Time Calculation (min) 42 min    Activity Tolerance Patient tolerated treatment well    Behavior During Therapy [facility] for tasks assessed/performed           Past Medical History:  Diagnosis Date   Arthritis    Asthma    Blind    Blindness and low vision    left eye glass eye,  legally blind in right eye   Diabetes mellitus    Diabetic neuropathy ([place])    Hyperlipidemia    Hypertension    Hypothyroidism    Spinal stenosis     Past Surgical History:  Procedure Laterality Date   [place]   x 2   CHOLECYSTECTOMY  2008   ENUCLEATION Bilateral 09/15/1998   EYE SURGERY  2016   fitting artificial eye    There were no vitals filed for this visit.   Subjective Assessment - 02/04/20 0954    Subjective I am 8/10 but after RX I am 7/10 today in low back.  I still cant sleep on my right side   I mostly sleep on my back    Pertinent History visually impaired/blind, CHF, diabetic, OSA on CPAP, spinal stenosis    Limitations House hold activities;Lifting;Sitting;Standing;Walking    Patient Stated Goals Get back/leg better    Currently in Pain? Yes    Pain Score 8     Pain Location Back    Pain Orientation Right    Pain Descriptors / Indicators Aching    Pain Type Chronic pain    Pain Onset More than a month ago    Pain Frequency  Intermittent    Pain Score 7    Pain Location Hip    Pain Orientation Right                             [facility] Adult PT Treatment/Exercise - 02/04/20 0001      Lumbar Exercises: Stretches   Lower Trunk Rotation 5 reps;20 seconds    Lower Trunk Rotation Limitations PT assist stretch    Piriformis Stretch 3 reps;30 seconds      Lumbar Exercises: Supine   Pelvic Tilt 10 reps    Pelvic Tilt Limitations x2 VC and TC  needs extra time for correct execution    Clam 10 reps    Bent Knee Raise 10 reps    Straight Leg Raise 5 reps    Straight Leg Raises Limitations Limited due to pain      Modalities   Modalities Moist Heat      Moist Heat Therapy   Number Minutes Moist Heat 15 Minutes    Moist Heat Location Hip   RT concurrent with exercise     Manual Therapy  Manual Therapy Joint mobilization;Soft tissue mobilization    Manual therapy comments skilled palpation with TPDN    Joint Mobilization LAD RT leg  AP hip grade 3/4    Soft tissue mobilization Rt hip/gluts and piriformis and RT Quadratus lumborum            Trigger Point Dry Needling - 02/04/20 0001    Consent Given? Yes    Education Handout Provided Previously provided    Muscles Treated Back/Hip Gluteus minimus;Gluteus medius;Gluteus maximus;Piriformis;Quadratus lumborum    Dry Needling Comments 100 mm .40 gage    Gluteus Medius Response Twitch response elicited;Palpable increased muscle length    Gluteus Maximus Response Twitch response elicited;Palpable increased muscle length    Piriformis Response Twitch response elicited;Palpable increased muscle length    Quadratus Lumborum Response Twitch response elicited;Palpable increased muscle length                     PT Long Term Goals - 02/04/20 0956      PT LONG TERM GOAL #1   Title Independent with HEP    Baseline pt is independent with initial HEP    Time 6    Period Weeks    Status On-going      PT LONG TERM GOAL #2   Title  Improve FOTO outcome measure socre to 49% or less impairment    Time 6    Period Weeks    Status Unable to assess      PT LONG TERM GOAL #3   Title Tolerate right sidelying position for sleep with sleep disturbance symptoms/pain decreased 50% or greater from current status    Baseline still not able to lie on RT side now. must be on back    Time 6    Period Weeks    Status On-going      PT LONG TERM GOAL #4   Title Tolerate sitting at least 30 min for use of transportation and sitting to eat meals with pain <5/10    Baseline Pt can sit for  30 minutes but pain in 8/10    Time 6    Period Weeks    Status On-going      PT LONG TERM GOAL #5   Title Increase trunk flexion AROM at least 20-30 deg to improve ability for dressing, donning shoes    Baseline able to slide in supine Rt ankle to knee    Time 6    Period Weeks    Status On-going                 Plan - 02/04/20 1036    Clinical Impression Statement Pt continues HEP at home with caregivers but no goals met this week.  Pt consents to TPDN and improves from 8/10 to 7/10 pain today in clinic.  Pt/caregiver encouraged to continue walkiing and doing movement during the day but PT believes that pt remains sedentary.  Pt was closely monitored throughout PT session.  Will continue to advance HEP as able for patient and encourage walking throughout the day for whole body movement    Personal Factors and Comorbidities Comorbidity 3+    Comorbidities blindness, balance issues, diabetic, CHF    Examination-Activity Limitations Bend;Lift;Squat;Sleep;Dressing;Bathing;Sit;Hygiene/Grooming;Bed Mobility;Locomotion Level;Carry;Stand    Examination-Participation Restrictions Shop;Community Activity;Cleaning;Meal Prep    PT Treatment/Interventions ADLs/Self Care Home Management;Cryotherapy;Electrical Stimulation;Moist Heat;Functional mobility training;Neuromuscular re-education;Therapeutic exercise;Patient/family education;Manual  techniques;Dry needling;Therapeutic activities;Gait training    PT Next Visit Plan pelvic titls, LTR left side emphasis,  stretch piriformis, progress squatting  and mini squats as able, standing exercises    PT Home Exercise Plan [email or web address]    Consulted and Agree with Plan of Care Patient;Other (Comment)           Patient will benefit from skilled therapeutic intervention in order to improve the following deficits and impairments:  Pain, Impaired flexibility, Decreased strength, Decreased activity tolerance, Decreased range of motion, Decreased balance, Difficulty walking  Visit Diagnosis: Chronic right-sided low back pain with right-sided sciatica  Muscle weakness (generalized)  Difficulty in walking, not elsewhere classified     Problem List Patient Active Problem List   Diagnosis Date Noted   Pre-operative cardiovascular examination 02/18/2019   Septic bursitis of elbow, right 10/30/2018   CRI (chronic renal insufficiency), stage 3 (moderate) 07/26/2018   Morbid obesity ([place]) 07/26/2018   Intractable nausea and vomiting 03/06/2018   Right leg pain 12/19/2017   Leukocytosis 12/19/2017   Benign positional vertigo 12/19/2017   Weakness 12/12/2017   Laryngopharyngeal reflux (LPR) 10/19/2017   Post-nasal drainage 10/19/2017   Acute sinusitis 10/19/2017   Degeneration of lumbar intervertebral disc 07/29/2017   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, left 03/08/2017   Obstructive sleep apnea on CPAP 02/09/2017   Chronic diastolic heart failure ([place]) 11/08/2016   Near syncope 01/30/2016   SOB (shortness of breath)    CAP (community acquired pneumonia) 09/12/2015   Hypoxia 09/12/2015   Essential hypertension 07/05/2015   Hypotension 07/05/2015   Diabetes mellitus with neurological manifestations ([place]) 11/04/2014   Hyperlipidemia LDL goal <70 11/04/2014   Spinal stenosis,  multilevel 11/04/2014   Hyperkalemia 11/01/2014   Hypoglycemia 08/09/2014   Type 2 diabetes mellitus with complication, with long-term current use of insulin ([place]) 08/08/2014   Elevated troponin 08/08/2014   Nausea vomiting and diarrhea 08/08/2014   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   Dizziness 08/06/2012   Orthostatic hypotension 08/06/2012   Hypernatremia 08/06/2012   Acute gastroenteritis 08/13/2011   Gastroparesis 08/13/2011   Low back pain 08/13/2011   Oral thrush 08/13/2011   Gastroenteritis 05/23/2011   Dehydration 05/23/2011   Blindness 05/23/2011   DM type 1, not at goal, causing eye disease ([place]) 05/23/2011   Asthma 05/23/2011   Diarrhea 05/23/2011   Vomiting 05/23/2011   Hypothyroidism 05/23/2011   Diabetic neuropathy ([place]) 05/23/2011   Diabetic nephropathy ([place]) 05/23/2011    Voncille Lo, PT, [place] Certified Exercise Expert for the Aging Adult  02/04/20 10:39 AM Phone: [phone] Fax: [place] [facility] [address] [place], [place], [address] Phone: [phone]   Fax:  [phone]  Name: REMINGTON SKALSKY MRN: [id number] Date of Birth: [birth year]

## 2020-02-06 ENCOUNTER — Ambulatory Visit: Payer: Medicare Other | Admitting: Physical Therapy

## 2020-02-07 ENCOUNTER — Other Ambulatory Visit (HOSPITAL_COMMUNITY): Payer: Self-pay | Admitting: Cardiology

## 2020-02-11 ENCOUNTER — Ambulatory Visit: Payer: Medicare Other | Admitting: Physical Therapy

## 2020-02-19 ENCOUNTER — Ambulatory Visit: Payer: Medicare Other | Admitting: Nurse Practitioner

## 2020-02-20 ENCOUNTER — Encounter (INDEPENDENT_AMBULATORY_CARE_PROVIDER_SITE_OTHER): Payer: Medicare Other | Admitting: Ophthalmology

## 2020-03-10 ENCOUNTER — Other Ambulatory Visit: Payer: Self-pay | Admitting: *Deleted

## 2020-03-10 NOTE — Patient Outreach (Signed)
[facility]) Care Management  [place]  03/10/2020   Amy [name] [birth year] [id number]  Subjective: Successful telephone outreach call to patient. HIPAA identifiers obtained. Patient reports she is doing well. Patient denies having any falls and explains that she loves her new aide that comes to assist her daily. She continues to receive counseling telephonically and states is in a good place emotionally. Patient explained that she is using a [employer] that [employer], a medical supply company sent her. Per patient Denzil Hughes needs more information to get the [employer] approved through her insurance. Patient adds this is causing her a lot of confusion and asked this nurse to assist her with contacting [place] and her PCP's office to clarify the issues. Nurse called Denzil Hughes and discovered that they need further information before they can process the request through [facility] Medicare.  [place] representative gave this nurse a phone number [phone] and the patient's account number [id number] and stated that the PCP's CMA could call them back and complete the needed information. Nurse did call Dr. Hoover Brunette CMA [place] and left a detailed voice message with this information as well as this nurse's contact number to call her back.   Patient reports that her blood sugar readings range from 69 to  >200. Patient states that she has had some low blood sugar readings but they have improved since her Tresiba dose was lowered. Patient reports going to PT to work on increasing her strength and mobility, she explains she has been doing some chair exercises at home, and also admits that she has difficulty maintaining a low carbohydrate diet. Patient states she did receive the healthy meal planning booklet, chair exercise printouts and Advance Directive documents this nurse sent her previously; per patient she does not have any questions about the material sent. Nurse encouraged  patient to use portion control, to do her best to limit unhealthy food choices, and to increase the amount of times she is doing the chair exercises. Her last A1c was 8.8 on 09/26/19 and she reports that she did receive both the covid booster and this years flu shot. Patient did not have any further questions at this time and did confirm that she has this nurse's contact information to call if needed.   Encounter Medications:  Outpatient Encounter Medications as of 03/10/2020  Medication Sig Note  . albuterol (PROVENTIL HFA;VENTOLIN HFA) 108 (90 BASE) MCG/ACT inhaler Inhale 2 puffs into the lungs every 4 (four) hours as needed for wheezing or shortness of breath. For short   . aspirin EC 81 MG tablet Take 81 mg by mouth daily.   . B-D UF III MINI PEN NEEDLES 31G X 5 MM MISC U 1 PEN NEEDLE QID WITH EACH INJECTION   . DOXYCYCLINE HYCLATE PO Take by mouth.   . esomeprazole (NEXIUM) 40 MG capsule Take 40 mg by mouth daily.    . furosemide (LASIX) 80 MG tablet Take 80 mg by mouth daily.   Marland Kitchen HUMALOG KWIKPEN 100 UNIT/ML KiwkPen Inject 8-14 Units into the skin See admin instructions. Inject 8 units in morning, 12 units at lunch, and 14 units at dinner   . levothyroxine (SYNTHROID, LEVOTHROID) 150 MCG tablet Take 150 mcg by mouth daily before breakfast.   . Linaclotide (LINZESS) 145 MCG CAPS capsule Take 145 mcg by mouth daily as needed (constipation).    . metolazone (ZAROXOLYN) 2.5 MG tablet Take 1 tablet (2.5 mg total) by mouth daily.   . naproxen (NAPROSYN) 500 MG  tablet Take 500 mg by mouth 2 (two) times daily with a meal.   . ondansetron (ZOFRAN ODT) 4 MG disintegrating tablet Take 1 tablet (4 mg total) by mouth every 8 (eight) hours as needed for nausea or vomiting. 11/23/2019: Needs refill  . potassium chloride SA (KLOR-CON) 20 MEQ tablet TAKE 1 TABLET BY MOUTH DAILY   . pregabalin (LYRICA) 150 MG capsule Take 150 mg by mouth 3 (three) times daily.    . simvastatin (ZOCOR) 20 MG tablet Take 20 mg by  mouth every evening.    Marland Kitchen spironolactone (ALDACTONE) 25 MG tablet TAKE 1/2 TABLET BY MOUTH DAILY (Patient taking differently: Take 12.5 mg by mouth once. )   . torsemide (DEMADEX) 20 MG tablet TAKE 4 TABLETS (80 MG TOTAL) BY MOUTH DAILY.   Marland Kitchen TRESIBA FLEXTOUCH 200 UNIT/ML SOPN Inject 60 Units into the skin at bedtime.  03/10/2020: Decreased to 54 units   No facility-administered encounter medications on file as of 03/10/2020.    Functional Status:  In your present state of health, do you have any difficulty performing the following activities: 10/02/2019 05/29/2019  Hearing? Y N  Comment has hearing aids -  Vision? 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 N N  Walking or climbing stairs? Y Y  Comment per patient due to pain in right leg and hips blind  Dressing or bathing? Y Y  Comment patient has aide daily to assist due to blindness and disabilities blind  Doing errands, shopping? Y Y  Comment yes, patient can cognitively go by herself but need transportation due to blindness. Patient's aide goes to the grocery store blind  Preparing Food and eating ? Y Y  Comment aide does cooking has aide  Using the Toilet? Y N  Comment patient has a hard time wiping herself due to obesity -  In the past six months, have you accidently leaked urine? Y N  Comment takes many diuretics -  Do you have problems with loss of bowel control? N N  Managing your Medications? [name]  Comment gets assistance to manage pill box managed by nurse  Managing your Finances? N N  Housekeeping or managing your Housekeeping? Y Y  Comment has aide to assist blind, has aide  Some recent data might be hidden    Fall/Depression Screening: Fall Risk  03/10/2020 01/07/2020 11/05/2019  Falls in the past year? 0 0 0  Number falls in past yr: 0 0 0  Injury with Fall? 0 0 0  Risk for fall due to : Impaired balance/gait;Impaired mobility;Impaired vision Impaired  vision;Impaired mobility;Impaired balance/gait Impaired vision  Follow up Falls prevention discussed;Education provided;Falls evaluation completed Falls prevention discussed;Education provided;Falls evaluation completed Falls prevention discussed;Education provided;Falls evaluation completed   PHQ 2/9 Scores 10/01/2019 05/29/2019  PHQ - 2 Score 3 1  PHQ- 9 Score 5 -    Assessment:  Goals Addressed            This Visit's Progress   . Monitor and Manage My Blood Sugar       Follow Up Date 05/14/20    - check blood sugar at prescribed times - check blood sugar if I feel it is too high or too low - enter blood sugar readings and medication or insulin into daily log - take the blood sugar meter to all doctor visits    Why is this important?   Checking your blood sugar at home  helps to keep it from getting very high or very low.  Writing the results in a diary or log helps the doctor know how to care for you.  Your blood sugar log should have the time, date and the results.  Also, write down the amount of insulin or other medicine that you take.  Other information, like what you ate, exercise done and how you were feeling, will also be helpful.     Notes: Patient has a [employer] she is using on a trial basis. She is trying to get the device approved through her insurance company but is having difficulty. Nurse called [facility] home [employer] and PCP's office to assist patient with getting FreeStyle Elenor Legato approved through her insurance.     . COMPLETED: Patient will report a decreased A1C of 0.5-1 within the next 90 days       [place] (see longtitudinal plan of care for additional care plan information)  Objective:  Lab Results  Component Value Date   HGBA1C 10.7 (H) 11/02/2018 .   Lab Results  Component Value Date   CREATININE 1.78 (H) 11/22/2019   CREATININE 1.62 (H) 07/11/2019   CREATININE 1.55 (H) 02/18/2019 .   Marland Kitchen No results found for: EGFR  Current Barriers:   Marland Kitchen Knowledge Deficits related to basic Diabetes pathophysiology and self care/management  Case Manager Clinical Goal(s):  Over the next 90 days, patient will demonstrate improved adherence to prescribed treatment plan for diabetes self care/management as evidenced by:  Marland Kitchen Verbalize daily monitoring and recording of CBG within 90 days . Verbalize adherence to ADA/ carb modified diet within the next 90 days . Verbalize exercise 1-2 days/week . Verbalize adherence to prescribed medication regimen within the next 90 days   Interventions:  . Reviewed medications with patient and discussed importance of medication adherence . Provided patient with written educational materials related to hypo and hyperglycemia and importance of correct treatment . Advised patient, providing education and rationale, to check cbg daily and record, calling doctor for findings outside established parameters.   . Encouraged patient to increase physical activity, discussed patient's weight loss goal, nurse will send EMMI chair exercise printouts, weight loss diet, weight loss tips . Encouraged patient to follow low carbohydrate and sugar diet. Gave encouragement and previously mailed education about healthy meal planning.  . Nurse called [facility] home [employer] and PCP's office to assist patient with getting FreeStyle Elenor Legato approved through her insurance.   Patient Self Care Activities:  . Self administers oral medications as prescribed . Self administers insulin as prescribed . Self administers injectable DM medication Tyler Aas) as prescribed . Checks blood sugars as prescribed and utilize hyper and hypoglycemia protocol as needed . Patient discussed having difficulty maintaining a low carbohydrate diet. Reports knowing what she needs to do but it is hard to use portion control and eat healthy snacks.   Please see past updates related to this goal by clicking on the "Past Updates" button in the selected goal  original  entry date of 01/07/20. Updated goals today on 03/10/20     . Perform Foot Care   On track    Follow Up Date 05/14/20    - check feet daily for cuts, sores or redness - keep feet up while sitting - trim toenails straight across - wash and dry feet carefully every day - wear comfortable, cotton socks - wear comfortable, well-fitting shoes    Why is this important?   Good foot care is very important  when you have diabetes.  There are many things you can do to keep your feet healthy and catch a problem early.    Notes: Patient sits with her feet elevated. She states her aide looks at her feet daily and she see podiatrist [facility] routinely.      Plan: RN Health Coach will send PCP today's assessment note, will call patient within the month of December, and patient agrees to future outreach calls.   Amy Loron RN, BSN [place] [phone] [name]

## 2020-03-10 NOTE — Patient Instructions (Signed)
Goals Addressed            This Visit's Progress   . Monitor and Manage My Blood Sugar       Follow Up Date 05/14/20    - check blood sugar at prescribed times - check blood sugar if I feel it is too high or too low - enter blood sugar readings and medication or insulin into daily log - take the blood sugar meter to all doctor visits    Why is this important?   Checking your blood sugar at home helps to keep it from getting very high or very low.  Writing the results in a diary or log helps the doctor know how to care for you.  Your blood sugar log should have the time, date and the results.  Also, write down the amount of insulin or other medicine that you take.  Other information, like what you ate, exercise done and how you were feeling, will also be helpful.     Notes: Patient has a [employer] she is using on a trial basis. She is trying to get the device approved through her insurance company but is having difficulty. Nurse called [facility] [employer] and PCP's office to assist patient with getting FreeStyle Josephine Igo approved through her insurance.     . COMPLETED: Patient will report a decreased A1C of 0.5-1 within the next 90 days       CARE PLAN ENTRY (see longtitudinal plan of care for additional care plan information)  Objective:  Lab Results  Component Value Date   HGBA1C 10.7 (H) 11/02/2018 .   Lab Results  Component Value Date   CREATININE 1.78 (H) 11/22/2019   CREATININE 1.62 (H) 07/11/2019   CREATININE 1.55 (H) 02/18/2019 .   Marland Kitchen No results found for: EGFR  Current Barriers:  Marland Kitchen Knowledge Deficits related to basic Diabetes pathophysiology and self care/management  Case Manager Clinical Goal(s):  Over the next 90 days, patient will demonstrate improved adherence to prescribed treatment plan for diabetes self care/management as evidenced by:  Marland Kitchen Verbalize daily monitoring and recording of CBG within 90 days . Verbalize adherence to ADA/ carb modified diet  within the next 90 days . Verbalize exercise 1-2 days/week . Verbalize adherence to prescribed medication regimen within the next 90 days   Interventions:  . Reviewed medications with patient and discussed importance of medication adherence . Provided patient with written educational materials related to hypo and hyperglycemia and importance of correct treatment . Advised patient, providing education and rationale, to check cbg daily and record, calling doctor for findings outside established parameters.   . Encouraged patient to increase physical activity, discussed patient's weight loss goal, nurse will send EMMI chair exercise printouts, weight loss diet, weight loss tips . Encouraged patient to follow low carbohydrate and sugar diet. Gave encouragement and previously mailed education about healthy meal planning.  . Nurse called [employer] and PCP's office to assist patient with getting FreeStyle Josephine Igo approved through her insurance.   Patient Self Care Activities:  . Self administers oral medications as prescribed . Self administers insulin as prescribed . Self administers injectable DM medication Evaristo Bury) as prescribed . Checks blood sugars as prescribed and utilize hyper and hypoglycemia protocol as needed . Patient discussed having difficulty maintaining a low carbohydrate diet. Reports knowing what she needs to do but it is hard to use portion control and eat healthy snacks.   Please see past updates related to this goal by clicking  on the "Past Updates" button in the selected goal  original entry date of 01/07/20. Updated goals today on 03/10/20     . Perform Foot Care   On track    Follow Up Date 05/14/20    - check feet daily for cuts, sores or redness - keep feet up while sitting - trim toenails straight across - wash and dry feet carefully every day - wear comfortable, cotton socks - wear comfortable, well-fitting shoes    Why is this important?   Good foot  care is very important when you have diabetes.  There are many things you can do to keep your feet healthy and catch a problem early.    Notes: Patient sits with her feet elevated. She states her aide looks at her feet daily and she see podiatrist [facility] routinely.

## 2020-03-18 IMAGING — MG DIGITAL SCREENING BILATERAL MAMMOGRAM WITH TOMO AND CAD
8 of 14 series · 8 of 40 positions shown · non-contrast
Comparison: Previous exam(s).

CLINICAL DATA: Screening.

EXAM:
DIGITAL SCREENING BILATERAL MAMMOGRAM WITH TOMO AND CAD

[L CC synth-2D (1 of 2)]
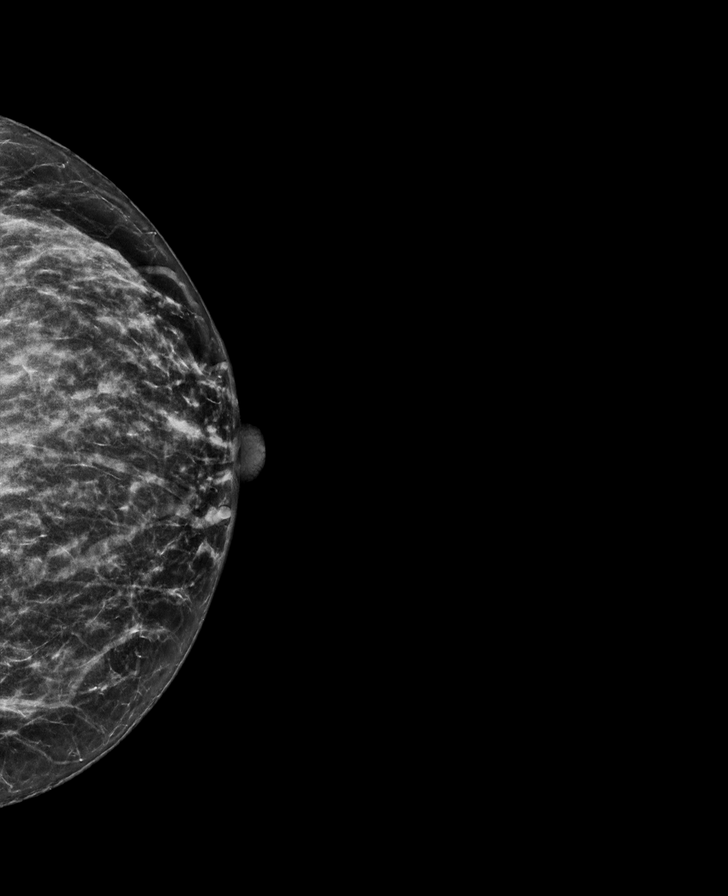

[L MLO synth-2D]
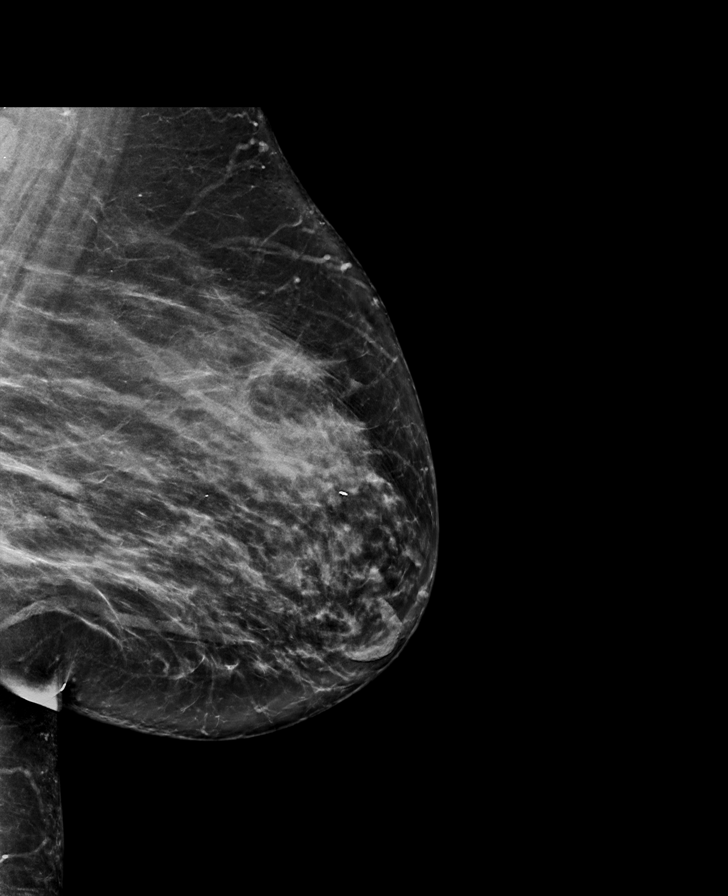

[R CC synth-2D (1 of 2)]
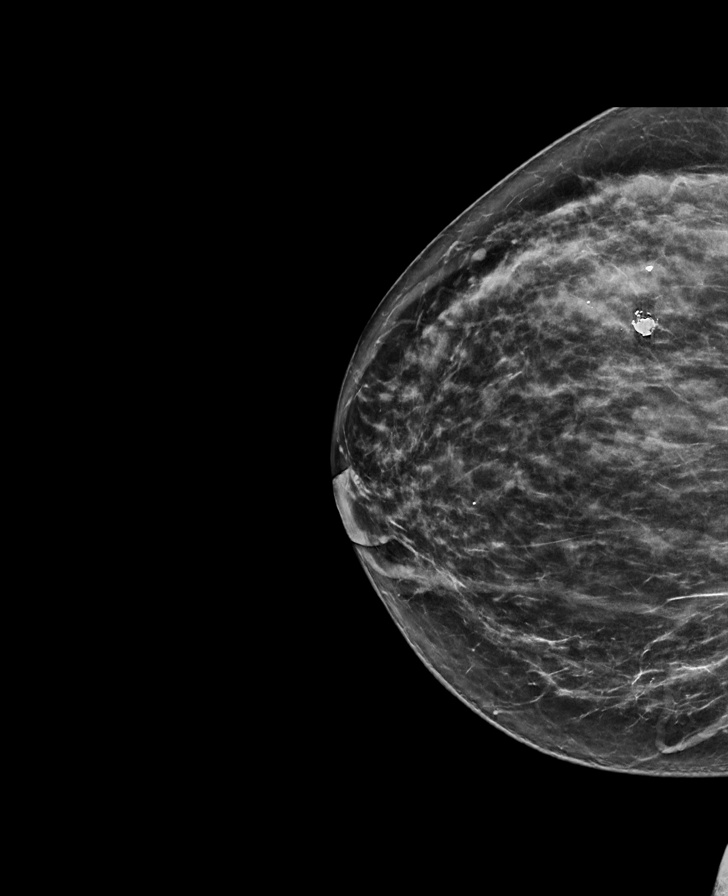

[L CC synth-2D (2 of 2)]
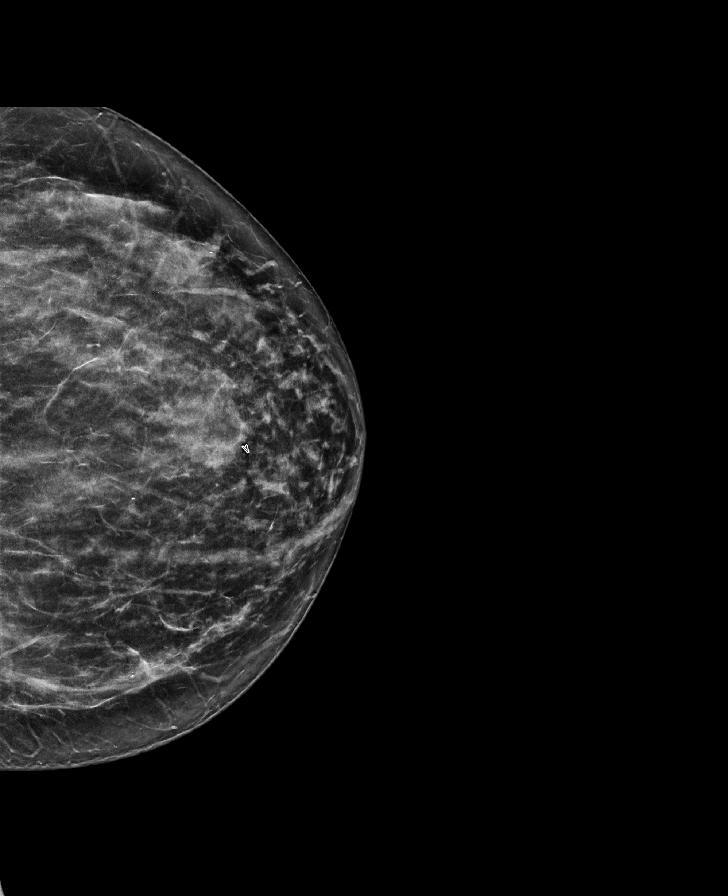

[R MLO synth-2D (1 of 2)]
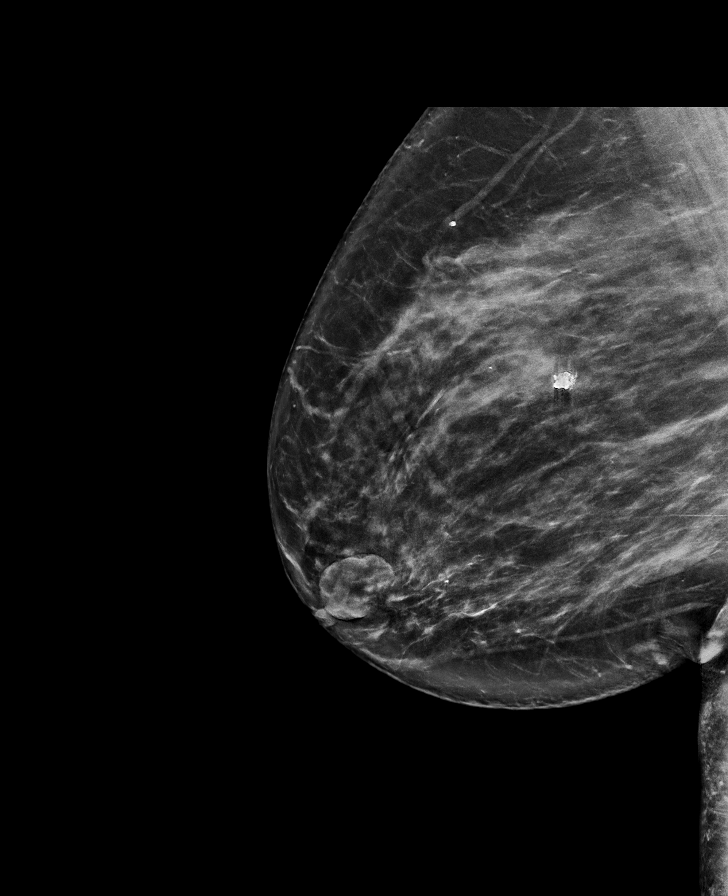

[R MLO synth-2D (2 of 2)]
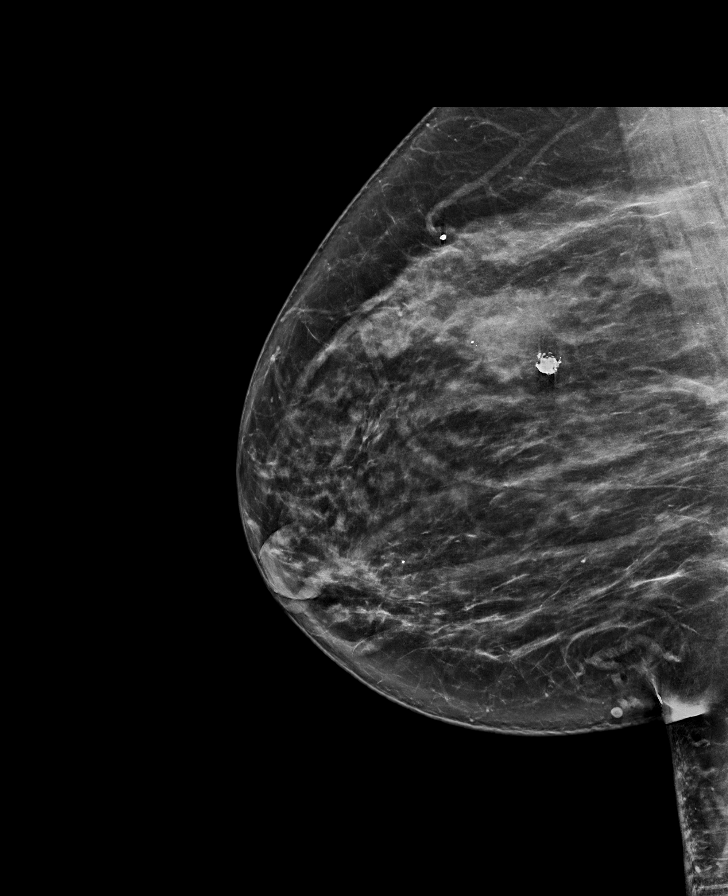

[R CC synth-2D (2 of 2)]
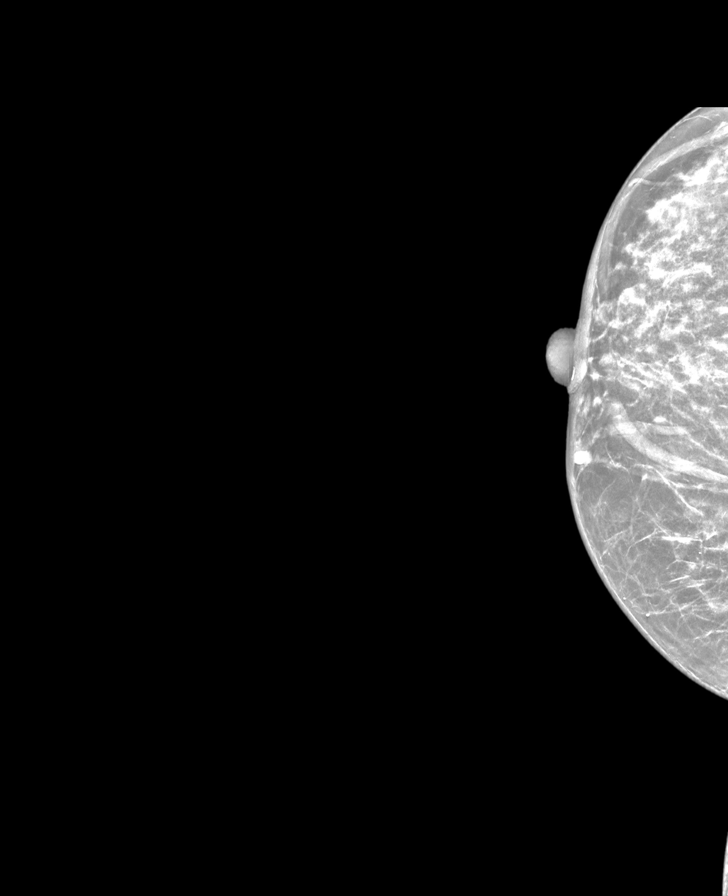

[L MLO tomo · tomo slice 46/91.0]
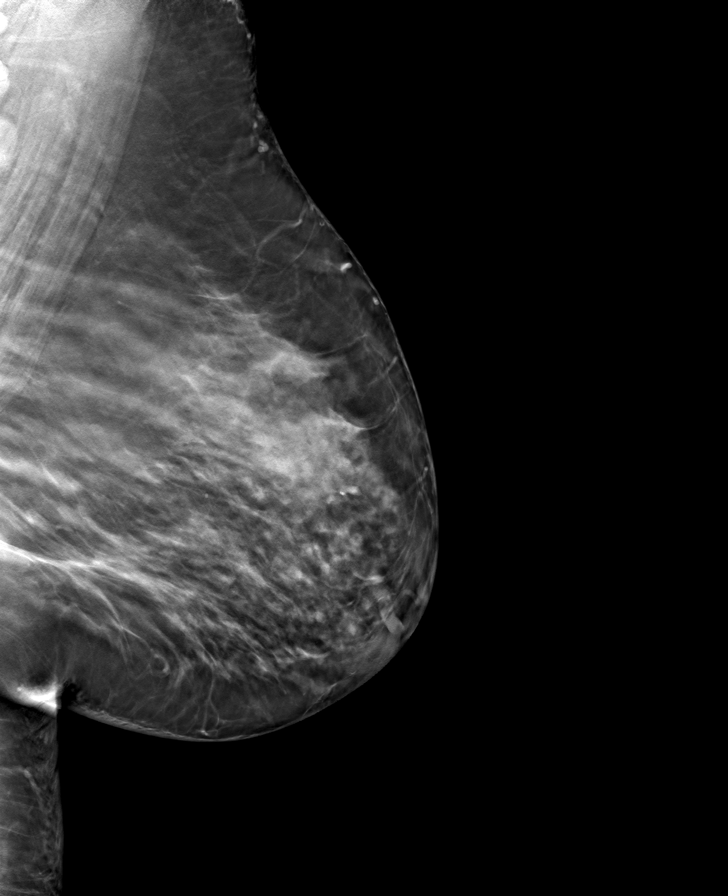

[8 of 40 positions shown; findings below may reference images not displayed]

ACR Breast Density Category c: The breast tissue is heterogeneously
dense, which may obscure small masses.
FINDINGS: There are no findings suspicious for malignancy. Images were
processed with CAD.
IMPRESSION: No mammographic evidence of malignancy. A result letter of this
screening mammogram will be mailed directly to the patient.

RECOMMENDATION:
Screening mammogram in one year. (Code:[id number])

BI-RADS CATEGORY  1: Negative.

## 2020-03-24 ENCOUNTER — Encounter: Payer: Self-pay | Admitting: Physical Therapy

## 2020-03-24 ENCOUNTER — Other Ambulatory Visit: Payer: Self-pay

## 2020-03-24 ENCOUNTER — Ambulatory Visit: Payer: Medicare Other | Attending: Neurosurgery | Admitting: Physical Therapy

## 2020-03-24 DIAGNOSIS — M25652 Stiffness of left hip, not elsewhere classified: Secondary | ICD-10-CM | POA: Insufficient documentation

## 2020-03-24 DIAGNOSIS — M5441 Lumbago with sciatica, right side: Secondary | ICD-10-CM | POA: Insufficient documentation

## 2020-03-24 DIAGNOSIS — R262 Difficulty in walking, not elsewhere classified: Secondary | ICD-10-CM | POA: Diagnosis present

## 2020-03-24 DIAGNOSIS — G8929 Other chronic pain: Secondary | ICD-10-CM | POA: Diagnosis present

## 2020-03-24 DIAGNOSIS — M6281 Muscle weakness (generalized): Secondary | ICD-10-CM | POA: Diagnosis present

## 2020-03-24 NOTE — Therapy (Signed)
[place], [place], [address] Phone: [phone]   Fax:  [phone]  Physical Therapy Treatment/ERO  Patient Details  Name: Amy [name] MRN: [id number] Date of Birth: 11/28/1960 Referring Provider (PT): Amy Green, NP   Encounter Date: 03/24/2020   PT End of Session - 03/24/20 1212    Visit Number 5    Number of Visits 13    Date for PT Re-Evaluation 05/19/20    Authorization Type [employer] MCR/MCD, recheck FOTO status by visit 6, progress note by visit 10    PT Start Time 1146    PT Stop Time 1233    PT Time Calculation (min) 47 min    Activity Tolerance Patient tolerated treatment well    Behavior During Therapy Amy [name] for tasks assessed/performed           Past Medical History:  Diagnosis Date  . Arthritis   . Asthma   . Blind   . Blindness and low vision    left eye glass eye,  legally blind in right eye  . Diabetes mellitus   . Diabetic neuropathy ([place])   . Hyperlipidemia   . Hypertension   . Hypothyroidism   . Spinal stenosis     Past Surgical History:  Procedure Laterality Date  . ABDOMINAL HYSTERECTOMY  1990  . [place]   x 2  . CHOLECYSTECTOMY  2008  . ENUCLEATION Bilateral 09/15/1998  . EYE SURGERY  2016   fitting artificial eye    There were no vitals filed for this visit.   Subjective Assessment - 03/24/20 1158    Subjective Pt uses transportation and had a death in the family and was not able to complete her course of PT.  she presents today with Amy [name] PCA/aide and is ready to complete course of therapy. pt reports 7/10 pain in back and hip on Right    Patient is accompained by: --   Amy [name]   Pertinent History visually impaired/blind, CHF, diabetic, OSA on CPAP, spinal stenosis    Limitations House hold activities;Lifting;Sitting;Standing;Walking    How long can you sit comfortably? I must have 2 pillows supporting my back for 15 minutes only     How long can you stand comfortably? 5-76minutes    How long can you walk comfortably? 5 minutes    Diagnostic tests CT for abdominal region in July but no recent lumbar imaging    Patient Stated Goals Get back/leg better    Currently in Pain? Yes    Pain Score 7     Pain Location Back    Pain Orientation Right    Pain Descriptors / Indicators Aching    Pain Type Chronic pain    Pain Onset More than a month ago    Pain Frequency Intermittent    Aggravating Factors  unable to lie down on RT side.  It is hard to turn in bed at night    Pain Score 7    Pain Location Hip    Pain Orientation Right              [facility] PT Assessment - 03/24/20 0001      Assessment   Medical Diagnosis Other intervertebral disc degeneration, lumbar region    Referring Provider (PT) Amy Green, NP    Onset Date/Surgical Date --   07/15/18 estimated per pt. subjective report   Hand Dominance Right    Prior Therapy last seen for PT  02-11-20      Precautions   Precautions Fall    Precaution Comments legally blind and uses rollator      Balance Screen   Has the patient fallen in the past 6 months No      [place] residence    Living Arrangements Alone   has assistance from aide 6 hours/day   Type of [place] Access Level entry    [place] - 4 wheels;Shower seat      Prior Function   Level of Independence Independent with community mobility with device;Needs assistance with ADLs;Needs assistance with homemaking    Comments mod I for gait with rollator  has aide accompany  has aise to bathe and dress      Cognition   Overall Cognitive Status Within Functional Limits for tasks assessed      Observation/Other Assessments   Focus on Therapeutic Outcomes (FOTO)  Intake 24% (original 31% intake Predicted 51% intake      Functional Tests   Functional tests Sit to Stand      Sit to Stand   Comments 5 x STS 78 sec      ROM / Strength    AROM / PROM / Strength AROM;Strength      AROM   Overall AROM Comments limited ability to assess due to balance , vision impairment and need to hold onto rollator    AROM Assessment Site Hip;Lumbar    Right Hip Flexion 80   standing   Right Hip External Rotation  45    Right Hip Internal Rotation  30    Left Hip Flexion 80   standing   Left Hip External Rotation  40    Left Hip Internal Rotation  15    Lumbar Flexion 50    Lumbar Extension 10    Lumbar - Right Side Bend 15    Lumbar - Left Side Bend 15    Lumbar - Right Rotation 25 %    Lumbar - Left Rotation 25 % available      Strength   Overall Strength Comments bilateral LE strength grossly 4-/5 except where specified    Right Knee Flexion 4/5    Right Knee Extension 4/5    Left Knee Flexion 4/5    Left Knee Extension 4/5      Transfers   Supine to Sit 3: Mod assist    Supine to Sit Details (indicate cue type and reason) --   needs assist to transfer sit to supine   Supine to Sit Details Tactile cues for initiation;Tactile cues for weight shifting    Sit to Supine 3: Mod assist    Sit to Supine Details (indicate cue type and reason) Tactile cues for weight shifting;Tactile cues for weight beaing   x2                        [facility] Adult PT Treatment/Exercise - 03/24/20 0001      Lumbar Exercises: Stretches   Lower Trunk Rotation 5 reps;20 seconds    Lower Trunk Rotation Limitations PT assist stretch    Piriformis Stretch 3 reps;30 seconds    Piriformis Stretch Limitations with towel asssist in supine aide assiting      Lumbar Exercises: Standing   Other Standing Lumbar Exercises sink squat 10 x 1     Other Standing Lumbar Exercises lumbar extension in standing x 10 x  2 with rest between sets.  No increase in pain on LT low back.        Lumbar Exercises: Supine   Pelvic Tilt 10 reps    Pelvic Tilt Limitations x2 VC and TC  needs extra time for correct execution and mod cuing    Other Supine Lumbar  Exercises bent knee marching                  PT Education - 03/24/20 1208    Education Details Reissued and reviewed iniital HEP  ERO            PT Short Term Goals - 03/24/20 1228      PT SHORT TERM GOAL #1   Title Pt will be consistent about walking/ standing  for 15 to 20 minutes every day with aide    Baseline unable to stand longer than 5 minutes    Time 3    Period Weeks    Status New    Target Date 04/14/20      PT SHORT TERM GOAL #2   Title Pt will be consisistent with HEP every day especially sink squats 3 x a day for movement snacks    Baseline Pt not consistent with exercises    Time 3    Period Weeks    Status New    Target Date 04/14/20      PT SHORT TERM GOAL #3   Title Pt will demonstrate 5 x sts with improvement in lower extremity strength at 40 seconds or less    Baseline 76 sec 5 x STS eval    Time 3    Period Weeks    Status New    Target Date 04/14/20             PT Long Term Goals - 03/24/20 1202      PT LONG TERM GOAL #1   Title Independent with HEP    Baseline Pt has been unable to come since 02-11-20 but returns with same pain    Time 8    Period Weeks    Status On-going    Target Date 05/19/20      PT LONG TERM GOAL #2   Title Improve FOTO outcome measure score to 51% from 24%    Baseline intake 24%   limited  76%    Time 8    Period Weeks    Status On-going    Target Date 05/19/20      PT LONG TERM GOAL #3   Title Tolerate right sidelying position for sleep with sleep disturbance symptoms/pain decreased 50% or greater from current status    Baseline still not able to lie on RT side now. must be on back    Time 8    Period Weeks    Status On-going    Target Date 05/19/20      PT LONG TERM GOAL #4   Title Tolerate sitting at least 30 min for use of transportation and sitting to eat meals with pain <5/10    Baseline Pt can sit only for 15 minutes of less at 8/10    Time 8    Period Weeks    Status On-going     Target Date 05/19/20      PT LONG TERM GOAL #5   Title Increase trunk flexion AROM at least 20-30 deg to improve ability for dressing, donning shoes    Baseline pt unable to bring ankle to knee in  sitting. unable to bend forward without 8/10 pain8    Time 8    Period Weeks    Status On-going    Target Date 05/19/20          reviewed after hiatus since 02-11-20 and with same problems   Access Code: [email or web address]: [email or web address]: 11/09/2021Prepared by: Donnetta Simpers BeardsleyExercises  Standing Lumbar Extension with Counter - 1-2 x daily - 7 x weekly - 2 sets - 10 reps - 3-5 seconds hold  Supine Posterior Pelvic Tilt - 1 x daily - 7 x weekly - 2 sets - 10 reps - 3-5 seconds hold  Lower Trunk Rotations - 1-2 x daily - 7 x weekly - 1-2 sets - 10 reps - 5-10 seconds hold  Supine Piriformis Stretch with Foot on Ground - 1 x daily - 7 x weekly - 1 sets - 2-3 reps - 20-30 seconds hold  Sit to Stand with Arm Reach Toward Target - 1 x daily - 7 x weekly - 3 sets - 10 reps  Supine March - 1 x daily - 7 x weekly - 3 sets - 10 reps      Plan - 03/24/20 1157    Clinical Impression Statement Pt. returns to cliinic since not attending since 02-11-20. Pt presents  with chronic LBP with RLE radiating pain consistent with likely radicular etiology. Pt had death in family and difficulty getting transportation.  Pt has lumbar stenosis but given factors including pain worse with lying down and sitting and no significant pain exacerbation with prolonged walking would suspect potential discogenic etiology (which could cause foraminal narrowing/stenosis). Pt tends to have lack of any movement at home and needs encouragement to use motion for pain relief.   Will continue 1 x a week to accomodate pt need for PT and to attend several other doctor visit and work with consistant transportation in order to develop a ggod HEP to use with aide at home including a walking program.    Personal Factors and  Comorbidities Comorbidity 3+    Comorbidities blindness, balance issues, diabetic, CHF    Examination-Activity Limitations Bend;Lift;Squat;Sleep;Dressing;Bathing;Sit;Hygiene/Grooming;Bed Mobility;Locomotion Level;Carry;Stand    Examination-Participation Restrictions Shop;Community Activity;Cleaning;Meal Prep    Stability/Clinical Decision Making Evolving/Moderate complexity    Clinical Decision Making Moderate    Rehab Potential Fair    PT Frequency 1x / week    PT Duration 8 weeks    PT Treatment/Interventions ADLs/Self Care Home Management;Cryotherapy;Electrical Stimulation;Moist Heat;Functional mobility training;Neuromuscular re-education;Therapeutic exercise;Patient/family education;Manual techniques;Dry needling;Therapeutic activities;Gait training    PT Next Visit Plan work on encouraging walking and movement consistently at homepelvic titls, LTR left side emphasis, stretch piriformis, progress squatting  and mini squats as able, standing exercises    PT Home Exercise Plan [email or web address]    Consulted and Agree with Plan of Care Patient;Other (Comment)   aide          Patient will benefit from skilled therapeutic intervention in order to improve the following deficits and impairments:  Pain, Impaired flexibility, Decreased strength, Decreased activity tolerance, Decreased range of motion, Decreased balance, Difficulty walking  Visit Diagnosis: Chronic right-sided low back pain with right-sided sciatica - Plan: PT plan of care cert/re-cert  Muscle weakness (generalized) - Plan: PT plan of care cert/re-cert  Difficulty in walking, not elsewhere classified - Plan: PT plan of care cert/re-cert  Stiffness of left hip, not elsewhere classified - Plan: PT plan of care cert/re-cert     Problem List Patient Active Problem List   Diagnosis Date Noted  .  Pre-operative cardiovascular examination 02/18/2019  . Septic bursitis of elbow, right 10/30/2018  . CRI (chronic renal insufficiency),  stage 3 (moderate) 07/26/2018  . Morbid obesity ([place]) 07/26/2018  . Intractable nausea and vomiting 03/06/2018  . Right leg pain 12/19/2017  . Leukocytosis 12/19/2017  . Benign positional vertigo 12/19/2017  . Weakness 12/12/2017  . Laryngopharyngeal reflux (LPR) 10/19/2017  . Post-nasal drainage 10/19/2017  . Acute sinusitis 10/19/2017  . Degeneration of lumbar intervertebral disc 07/29/2017  . 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 Obstructive sleep apnea on CPAP 02/09/2017  . Chronic diastolic heart failure ([place]) 11/08/2016  . Near syncope 01/30/2016  . SOB (shortness of breath)   . CAP (community acquired pneumonia) 09/12/2015  . Hypoxia 09/12/2015  . Essential hypertension 07/05/2015  . Hypotension 07/05/2015  . Diabetes mellitus with neurological manifestations ([place]) 11/04/2014  . Hyperlipidemia LDL goal <70 11/04/2014  . Spinal stenosis, multilevel 11/04/2014  . Hyperkalemia 11/01/2014  . Hypoglycemia 08/09/2014  . Type 2 diabetes mellitus with complication, with long-term current use of insulin ([place]) 08/08/2014  . Elevated troponin 08/08/2014  . Nausea vomiting and diarrhea 08/08/2014  . 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 Dizziness 08/06/2012  . Orthostatic hypotension 08/06/2012  . Hypernatremia 08/06/2012  . Acute gastroenteritis 08/13/2011  . Gastroparesis 08/13/2011  . Low back pain 08/13/2011  . Oral thrush 08/13/2011  . Gastroenteritis 05/23/2011  . Dehydration 05/23/2011  . Blindness 05/23/2011  . DM type 1, not at goal, causing eye disease ([place]) 05/23/2011  . Asthma 05/23/2011  . Diarrhea 05/23/2011  . Vomiting 05/23/2011  . Hypothyroidism 05/23/2011  . Diabetic neuropathy ([place]) 05/23/2011  . Diabetic nephropathy ([place]) 05/23/2011    Voncille Lo, PT, [place] Certified Exercise Expert for the Aging  Adult  03/24/20 1:49 PM Phone: [phone] Fax: [facility] [address] [place], [place], [address] Phone: [phone]   Fax:  [phone]  Name: Amy [name] MRN: [id number] Date of Birth: December 27, 1960

## 2020-03-24 NOTE — Patient Instructions (Signed)
     Voncille Lo, PT, [place] Certified Exercise Expert for the Aging Adult  03/24/20 12:07 PM Phone: [phone] Fax: [phone]

## 2020-03-30 ENCOUNTER — Other Ambulatory Visit: Payer: Self-pay

## 2020-03-30 ENCOUNTER — Ambulatory Visit (INDEPENDENT_AMBULATORY_CARE_PROVIDER_SITE_OTHER): Payer: Medicare Other | Admitting: Podiatry

## 2020-03-30 DIAGNOSIS — E1142 Type 2 diabetes mellitus with diabetic polyneuropathy: Secondary | ICD-10-CM

## 2020-03-30 DIAGNOSIS — B351 Tinea unguium: Secondary | ICD-10-CM

## 2020-03-30 DIAGNOSIS — M79676 Pain in unspecified toe(s): Secondary | ICD-10-CM | POA: Diagnosis not present

## 2020-03-30 DIAGNOSIS — L84 Corns and callosities: Secondary | ICD-10-CM | POA: Diagnosis not present

## 2020-03-31 ENCOUNTER — Encounter (INDEPENDENT_AMBULATORY_CARE_PROVIDER_SITE_OTHER): Payer: Self-pay | Admitting: Ophthalmology

## 2020-03-31 ENCOUNTER — Other Ambulatory Visit (HOSPITAL_COMMUNITY): Payer: Self-pay | Admitting: Internal Medicine

## 2020-03-31 ENCOUNTER — Ambulatory Visit (INDEPENDENT_AMBULATORY_CARE_PROVIDER_SITE_OTHER): Payer: Medicare Other | Admitting: Ophthalmology

## 2020-03-31 DIAGNOSIS — E1065 Type 1 diabetes mellitus with hyperglycemia: Secondary | ICD-10-CM

## 2020-03-31 DIAGNOSIS — E103521 Type 1 diabetes mellitus with proliferative diabetic retinopathy with traction retinal detachment involving the macula, right eye: Secondary | ICD-10-CM

## 2020-03-31 DIAGNOSIS — IMO0002 Reserved for concepts with insufficient information to code with codable children: Secondary | ICD-10-CM

## 2020-03-31 NOTE — Progress Notes (Signed)
03/31/2020     CHIEF COMPLAINT Patient presents for Retina Follow Up   HISTORY OF PRESENT ILLNESS: Amy [name] is a 59 y.o. 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  Severity is moderate.  Duration of 2 years.  Since onset it is stable.  I, the attending physician,  performed the HPI with the patient and updated documentation appropriately.          Comments    2 Year PDR f\u OD.   Pt states she has not noticed any changes or issues with OD. BGL: did not check  A1C: 8?       Last edited by Tilda Franco on 03/31/2020  9:34 AM. (History)      Referring physician: Velna Hatchet, MD [place],  [place] [id number]  HISTORICAL INFORMATION:   Selected notes from the MEDICAL RECORD NUMBER    Lab Results  Component Value Date   HGBA1C 10.7 (H) 11/02/2018     CURRENT MEDICATIONS: No current outpatient medications on file. (Ophthalmic Drugs)   No current facility-administered medications for this visit. (Ophthalmic Drugs)   Current Outpatient Medications (Other)  Medication Sig  . albuterol (PROVENTIL HFA;VENTOLIN HFA) 108 (90 BASE) MCG/ACT inhaler Inhale 2 puffs into the lungs every 4 (four) hours as needed for wheezing or shortness of breath. For short  . aspirin EC 81 MG tablet Take 81 mg by mouth daily.  . B-D UF III MINI PEN NEEDLES 31G X 5 MM MISC U 1 PEN NEEDLE QID WITH EACH INJECTION  . DOXYCYCLINE HYCLATE PO Take by mouth.  . esomeprazole (NEXIUM) 40 MG capsule Take 40 mg by mouth daily.   . furosemide (LASIX) 80 MG tablet Take 80 mg by mouth daily.  Marland Kitchen HUMALOG KWIKPEN 100 UNIT/ML KiwkPen Inject 8-14 Units into the skin See admin instructions. Inject 8 units in morning, 12 units at lunch, and 14 units at dinner  . levothyroxine (SYNTHROID, LEVOTHROID) 150 MCG tablet Take 150 mcg by mouth daily before breakfast.  . Linaclotide (LINZESS) 145 MCG CAPS  capsule Take 145 mcg by mouth daily as needed (constipation).   . metolazone (ZAROXOLYN) 2.5 MG tablet Take 1 tablet (2.5 mg total) by mouth daily.  . naproxen (NAPROSYN) 500 MG tablet Take 500 mg by mouth 2 (two) times daily with a meal.  . ondansetron (ZOFRAN ODT) 4 MG disintegrating tablet Take 1 tablet (4 mg total) by mouth every 8 (eight) hours as needed for nausea or vomiting.  . potassium chloride SA (KLOR-CON) 20 MEQ tablet TAKE 1 TABLET BY MOUTH DAILY  . pregabalin (LYRICA) 150 MG capsule Take 150 mg by mouth 3 (three) times daily.   . simvastatin (ZOCOR) 20 MG tablet Take 20 mg by mouth every evening.   Marland Kitchen spironolactone (ALDACTONE) 25 MG tablet TAKE 1/2 TABLET BY MOUTH DAILY (Patient taking differently: Take 12.5 mg by mouth once. )  . torsemide (DEMADEX) 20 MG tablet TAKE 4 TABLETS (80 MG TOTAL) BY MOUTH DAILY.  Marland Kitchen TRESIBA FLEXTOUCH 200 UNIT/ML SOPN Inject 60 Units into the skin at bedtime.    No current facility-administered medications for this visit. (Other)      REVIEW OF SYSTEMS: ROS    Positive for: Endocrine   Last edited by Tilda Franco on 03/31/2020  9:31 AM. (History)       ALLERGIES Allergies  Allergen Reactions  . Penicillins Hives and Swelling    Has patient had a PCN reaction causing immediate rash, facial/tongue/throat swelling, SOB or lightheadedness with hypotension: yes- face swelling Has patient had a PCN reaction causing severe rash involving mucus membranes or skin necrosis: no Has patient had a PCN reaction that required hospitalization unknown (childhood allergy) Has patient had a PCN reaction occurring within the last 10 years: no If all of the above answers are "NO", then may proceed with Cephalosporin use.   . Tramadol Nausea And Vomiting    Intense nausea  . Vicodin [Hydrocodone-Acetaminophen] Nausea And Vomiting  . Codeine Nausea Only  . Iodine-131 Hives  . Iohexol Hives    Pt developed itching and hives along with nasal congestion;  needs 13 hour premeds for future studies, Onset Date: [id number]   . Lisinopril Cough  . Sulfa Antibiotics Nausea And Vomiting    PAST MEDICAL HISTORY Past Medical History:  Diagnosis Date  . Arthritis   . Asthma   . Blind   . Blindness and low vision    left eye glass eye,  legally blind in right eye  . Diabetes mellitus   . Diabetic neuropathy ([place])   . Hyperlipidemia   . Hypertension   . Hypothyroidism   . Spinal stenosis    Past Surgical History:  Procedure Laterality Date  . ABDOMINAL HYSTERECTOMY  1990  . [place]   x 2  . CHOLECYSTECTOMY  2008  . ENUCLEATION Bilateral 09/15/1998  . EYE SURGERY  2016   fitting artificial eye    FAMILY HISTORY Family History  Problem Relation Age of Onset  . Diabetes Sister   . Glaucoma Sister   . Hypertension Sister   . Healthy Mother   . Healthy Father   . Stroke Brother   . Heart attack Paternal Aunt   . Breast cancer Neg Hx     SOCIAL HISTORY Social History   Tobacco Use  . Smoking status: Former Smoker    Types: Cigarettes    Quit date: 05/22/1992    Years since quitting: 27.8  . Smokeless tobacco: Never Used  Vaping Use  . Vaping Use: Never used  Substance Use Topics  . Alcohol use: No    Comment: quit 20 yrs ago  . Drug use: No         OPHTHALMIC EXAM: Base Eye Exam    Visual Acuity (Snellen - Linear)      Right Left   Dist [place] NLP        Tonometry (Tonopen, 9:33 AM)      Right Left   Pressure 16        Pupils      Dark Light Shape React   Right 3 3 Round None   Left           Visual Fields      Left Right   Restrictions  Total superior temporal, inferior temporal, superior nasal, inferior nasal deficiencies       Neuro/Psych    Oriented x3: Yes   Mood/Affect: Normal       Dilation    Right eye: 1.0% Mydriacyl, 2.5% Phenylephrine @ 9:33 AM        Slit Lamp and Fundus Exam    External Exam      Right Left   External Normal Normal       Slit Lamp Exam       Right Left   Lids/Lashes Normal  Conjunctiva/Sclera White and quiet    Cornea Clear Prosthesis   Anterior Chamber Deep and quiet    Iris Round and reactive    Lens Centered posterior chamber intraocular lens    Anterior Vitreous Normal        Fundus Exam      Right Left   Posterior Vitreous Clear silicone oil    Disc Use atrophy    Macula Detached, chronic    Vessels PDR quiet    Periphery Chronic atrophic RD, tractional with involutional retina, good PRP 360           IMAGING AND PROCEDURES  Imaging and Procedures for 03/31/20           ASSESSMENT/PLAN:  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  Will continue to monitor on an as-needed basis.      ICD-10-CM   1. 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  2.  Goal right eye is comfort, and not requiring any topical medications  3.  Ophthalmic Meds Ordered this visit:  No orders of the defined types were placed in this encounter.      Return in about 3 years (around 04/01/2023) for COLOR FP.  There are no Patient Instructions on file for this visit.   Explained the diagnoses, plan, and follow up with the patient and they expressed understanding.  Patient expressed understanding of the importance of proper follow up care.   Clent Demark Phoenyx Melka M.D. Diseases & Surgery of the Retina and Vitreous Retina & Diabetic [place] 03/31/20     Abbreviations: M myopia (nearsighted); A astigmatism; H hyperopia (farsighted); P presbyopia; Mrx spectacle prescription;  CTL contact lenses; OD right eye; OS left eye; OU both eyes  XT  exotropia; ET esotropia; PEK punctate epithelial keratitis; PEE punctate epithelial erosions; DES dry eye syndrome; MGD meibomian gland dysfunction; ATs artificial tears; PFAT's preservative free artificial tears; [place] nuclear sclerotic cataract; PSC posterior subcapsular cataract; ERM epi-retinal membrane; PVD posterior vitreous detachment; RD retinal detachment; DM diabetes mellitus; DR diabetic retinopathy; NPDR non-proliferative diabetic retinopathy; PDR proliferative diabetic retinopathy; CSME clinically significant macular edema; DME diabetic macular edema; dbh dot blot hemorrhages; CWS cotton wool spot; POAG primary open angle glaucoma; C/D cup-to-disc ratio; HVF humphrey visual field; GVF goldmann visual field; OCT optical coherence tomography; IOP intraocular pressure; BRVO Branch retinal vein occlusion; CRVO central retinal vein occlusion; CRAO central retinal artery occlusion; BRAO branch retinal artery occlusion; RT retinal tear; SB scleral buckle; PPV pars plana vitrectomy; VH Vitreous hemorrhage; PRP panretinal laser photocoagulation; IVK intravitreal kenalog; VMT vitreomacular traction; MH Macular hole;  NVD neovascularization of the disc; NVE neovascularization elsewhere; AREDS age related eye disease study; ARMD age related macular degeneration; POAG primary open angle glaucoma; EBMD epithelial/anterior basement membrane dystrophy; ACIOL anterior chamber intraocular lens; IOL intraocular lens; PCIOL posterior chamber intraocular lens; Phaco/IOL phacoemulsification with intraocular lens placement; [place] photorefractive keratectomy; LASIK laser assisted in situ keratomileusis; HTN hypertension; DM diabetes mellitus; COPD chronic obstructive pulmonary disease

## 2020-03-31 NOTE — Assessment & Plan Note (Signed)
Chronic retinal detachment, OD, blind pain left eye.  Will continue to monitor on an as-needed basis.

## 2020-04-01 ENCOUNTER — Encounter: Payer: Self-pay | Admitting: Podiatry

## 2020-04-01 NOTE — Progress Notes (Signed)
Subjective:  Patient ID: Amy [name], female    DOB: 27-Aug-1960,  MRN: [id number]  59 y.o. female presents with at risk foot care with history of diabetic neuropathy and painful callus(es) b/l heels and painful thick toenails that are difficult to trim. Painful toenails interfere with ambulation. Aggravating factors include wearing enclosed shoe gear. Pain is relieved with periodic professional debridement. Painful calluses are aggravated lying down, when weightbearing with and without shoegear. Pain is relieved with periodic professional debridement..    Her caregiver is present during the visit.      Amy [name] states she has been using her heel protectors, but not daily.  Review of Systems: Negative except as noted in the HPI.  Past Medical History:  Diagnosis Date  . Arthritis   . Asthma   . Blind   . Blindness and low vision    left eye glass eye,  legally blind in right eye  . Diabetes mellitus   . Diabetic neuropathy ([place])   . Hyperlipidemia   . Hypertension   . Hypothyroidism   . Spinal stenosis    Past Surgical History:  Procedure Laterality Date  . ABDOMINAL HYSTERECTOMY  1990  . [place]   x 2  . CHOLECYSTECTOMY  2008  . ENUCLEATION Bilateral 09/15/1998  . EYE SURGERY  2016   fitting artificial eye   Patient Active Problem List   Diagnosis Date Noted  . 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 Pre-operative cardiovascular examination 02/18/2019  . Septic bursitis of elbow, right 10/30/2018  . CRI (chronic renal insufficiency), stage 3 (moderate) 07/26/2018  . Morbid obesity ([place]) 07/26/2018  . Intractable nausea and vomiting 03/06/2018  . Right leg pain 12/19/2017  . Leukocytosis 12/19/2017  . Benign positional vertigo 12/19/2017  . Weakness 12/12/2017  . Laryngopharyngeal reflux (LPR) 10/19/2017  . Post-nasal  drainage 10/19/2017  . Acute sinusitis 10/19/2017  . Degeneration of lumbar intervertebral disc 07/29/2017  . 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 Obstructive sleep apnea on CPAP 02/09/2017  . Chronic diastolic heart failure ([place]) 11/08/2016  . Near syncope 01/30/2016  . SOB (shortness of breath)   . CAP (community acquired pneumonia) 09/12/2015  . Hypoxia 09/12/2015  . Essential hypertension 07/05/2015  . Hypotension 07/05/2015  . Diabetes mellitus with neurological manifestations ([place]) 11/04/2014  . Hyperlipidemia LDL goal <70 11/04/2014  . Spinal stenosis, multilevel 11/04/2014  . Hyperkalemia 11/01/2014  . Hypoglycemia 08/09/2014  . Type 2 diabetes mellitus with complication, with long-term current use of insulin ([place]) 08/08/2014  . Elevated troponin 08/08/2014  . Nausea vomiting and diarrhea 08/08/2014  . 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 Dizziness 08/06/2012  . Orthostatic hypotension 08/06/2012  . Hypernatremia 08/06/2012  . Acute gastroenteritis 08/13/2011  . Gastroparesis 08/13/2011  . Low back pain 08/13/2011  . Oral thrush 08/13/2011  . Gastroenteritis 05/23/2011  . Dehydration 05/23/2011  . Blindness 05/23/2011  . DM type 1, not at goal, causing eye disease ([place]) 05/23/2011  . Asthma 05/23/2011  . Diarrhea 05/23/2011  . Vomiting 05/23/2011  . Hypothyroidism 05/23/2011  . Diabetic neuropathy ([place]) 05/23/2011  . Diabetic nephropathy ([place]) 05/23/2011    Current Outpatient Medications:  .  albuterol (PROVENTIL HFA;VENTOLIN HFA) 108 (90 BASE) MCG/ACT inhaler, Inhale 2 puffs into  the lungs every 4 (four) hours as needed for wheezing or shortness of breath. For short, Disp: , Rfl:  .  aspirin EC 81 MG tablet, Take 81 mg by mouth daily., Disp: , Rfl:  .  B-D UF III MINI PEN NEEDLES 31G X 5 MM MISC, U 1 PEN NEEDLE QID  WITH EACH INJECTION, Disp: , Rfl:  .  DOXYCYCLINE HYCLATE PO, Take by mouth., Disp: , Rfl:  .  esomeprazole (NEXIUM) 40 MG capsule, Take 40 mg by mouth daily. , Disp: , Rfl:  .  furosemide (LASIX) 80 MG tablet, Take 80 mg by mouth daily., Disp: , Rfl:  .  HUMALOG KWIKPEN 100 UNIT/ML KiwkPen, Inject 8-14 Units into the skin See admin instructions. Inject 8 units in morning, 12 units at lunch, and 14 units at dinner, Disp: , Rfl:  .  levothyroxine (SYNTHROID, LEVOTHROID) 150 MCG tablet, Take 150 mcg by mouth daily before breakfast., Disp: , Rfl:  .  Linaclotide (LINZESS) 145 MCG CAPS capsule, Take 145 mcg by mouth daily as needed (constipation). , Disp: , Rfl:  .  metolazone (ZAROXOLYN) 2.5 MG tablet, Take 1 tablet (2.5 mg total) by mouth daily., Disp: 30 tablet, Rfl: 3 .  naproxen (NAPROSYN) 500 MG tablet, Take 500 mg by mouth 2 (two) times daily with a meal., Disp: , Rfl:  .  ondansetron (ZOFRAN ODT) 4 MG disintegrating tablet, Take 1 tablet (4 mg total) by mouth every 8 (eight) hours as needed for nausea or vomiting., Disp: 10 tablet, Rfl: 0 .  potassium chloride SA (KLOR-CON) 20 MEQ tablet, TAKE 1 TABLET BY MOUTH DAILY, Disp: 30 tablet, Rfl: 1 .  pregabalin (LYRICA) 150 MG capsule, Take 150 mg by mouth 3 (three) times daily. , Disp: , Rfl:  .  simvastatin (ZOCOR) 20 MG tablet, Take 20 mg by mouth every evening. , Disp: , Rfl:  .  spironolactone (ALDACTONE) 25 MG tablet, TAKE 1/2 TABLET BY MOUTH DAILY (Patient taking differently: Take 12.5 mg by mouth once. ), Disp: 315 tablet, Rfl: 0 .  torsemide (DEMADEX) 20 MG tablet, TAKE 4 TABLETS (80 MG TOTAL) BY MOUTH DAILY., Disp: 120 tablet, Rfl: 1 .  TRESIBA FLEXTOUCH 200 UNIT/ML SOPN, Inject 60 Units into the skin at bedtime. , Disp: , Rfl:  Allergies  Allergen Reactions  . Penicillins Hives and Swelling    Has patient had a PCN reaction causing immediate rash, facial/tongue/throat swelling, SOB or lightheadedness with hypotension: yes- face  swelling Has patient had a PCN reaction causing severe rash involving mucus membranes or skin necrosis: no Has patient had a PCN reaction that required hospitalization unknown (childhood allergy) Has patient had a PCN reaction occurring within the last 10 years: no If all of the above answers are "NO", then may proceed with Cephalosporin use.   . Tramadol Nausea And Vomiting    Intense nausea  . Vicodin [Hydrocodone-Acetaminophen] Nausea And Vomiting  . Codeine Nausea Only  . Iodine-131 Hives  . Iohexol Hives    Pt developed itching and hives along with nasal congestion; needs 13 hour premeds for future studies, Onset Date: [id number]   . Lisinopril Cough  . Sulfa Antibiotics Nausea And Vomiting   Social History   Tobacco Use  Smoking Status Former Smoker  . Types: Cigarettes  . Quit date: 05/22/1992  . Years since quitting: 27.8  Smokeless Tobacco Never Used    Objective:  There were no vitals filed for this visit. Constitutional Patient is a pleasant 59 y.o. African American  female morbidly obese in NAD. AAO x 3.  Vascular Capillary refill time to digits immediate b/l. Faintly palpable pedal pulses b/l. Pedal hair sparse. Lower extremity skin temperature gradient within normal limits. No pain with calf compression b/l. Trace edema noted b/l lower extremities. No ischemia or gangrene noted b/l lower extremities. No cyanosis or clubbing noted.  Neurologic Normal speech. Pt has subjective symptoms of neuropathy. Protective sensation intact 5/5 intact bilaterally with 10g monofilament b/l. Vibratory sensation intact b/l.  Dermatologic Pedal skin with normal turgor, texture and tone bilaterally. No open wounds bilaterally. No interdigital macerations bilaterally. Toenails 1-5 b/l elongated, discolored, dystrophic, thickened, crumbly with subungual debris and tenderness to dorsal palpation. Hyperkeratotic lesion(s) posteromedial aspect of heel left lower extremity and right lower extremity.   No erythema, no edema, no drainage, no fluctuance.  Orthopedic: Normal muscle strength 5/5 to all lower extremity muscle groups bilaterally. Hallux valgus with bunion deformity noted b/l lower extremities. Hammertoes noted to the 2-5 bilaterally.         Assessment:   1. Pain due to onychomycosis of toenail   2. Pre-ulcerative calluses   3. Diabetic peripheral neuropathy associated with type 2 diabetes mellitus ([place])    Plan:  Patient was evaluated and treated and all questions answered.  Onychomycosis with pain -Nails palliatively debridement as below. -Educated on self-care  Procedure: Nail Debridement Rationale: Pain Type of Debridement: manual, sharp debridement. Instrumentation: Nail nipper, rotary burr. Number of Nails: 10  -Examined patient. -Continue diabetic foot care principles. -Patient to continue soft, supportive shoe gear daily. -Encouraged Ms. Knipp to wear heel protectors daily when in bed. -Toenails 1-5 b/l were debrided in length and girth with sterile nail nippers and dremel without iatrogenic bleeding.  -Callus(es) posteromedial aspect of heel left lower extremity and right lower extremity pared utilizing sterile scalpel blade without complication or incident. Total number debrided =2. -Patient to report any pedal injuries to medical professional immediately. -Patient/POA to call should there be question/concern in the interim.  Return in about 3 months (around 06/30/2020) for diabetic toenails, corn(s)/callus(es).  Marzetta Board, DPM

## 2020-04-08 IMAGING — CT CT ABD-PELV W/O CM
2 of 4 series · 16 of 46 positions shown, 18 images · non-contrast
Comparison: CT 09/11/2015

CLINICAL DATA: Low back/abdominal and right flank pain.

EXAM:
CT ABDOMEN AND PELVIS WITHOUT CONTRAST
TECHNIQUE: Multidetector CT imaging of the abdomen and pelvis was performed
following the standard protocol without IV contrast.

[Series 2: axial st · axial · 0.69mm/px · z∈[+1039,+1419]mm · 13 of 86 slices shown, 15 images]
[im 5/86  soft-tissue]
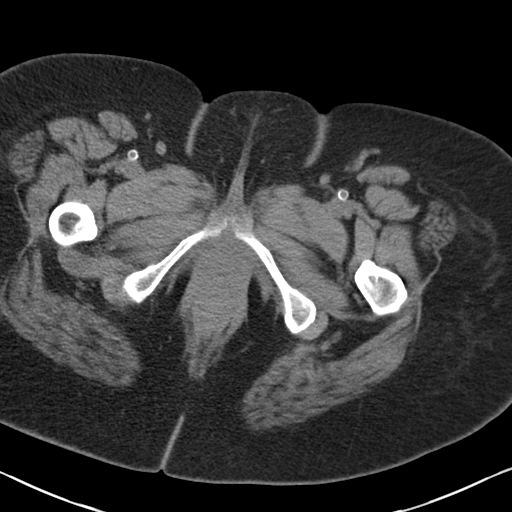
[im 5/86  bone]
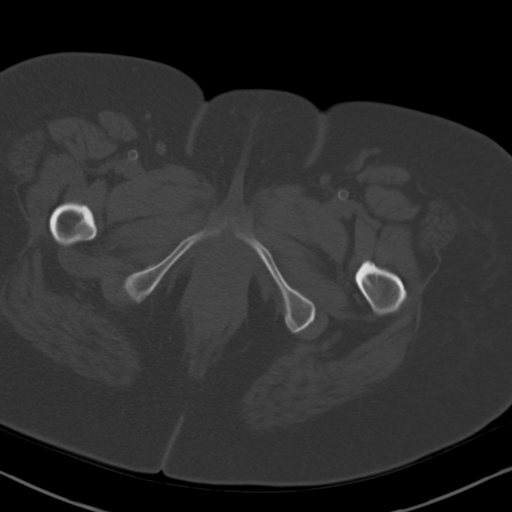
[im 13/86  soft-tissue]
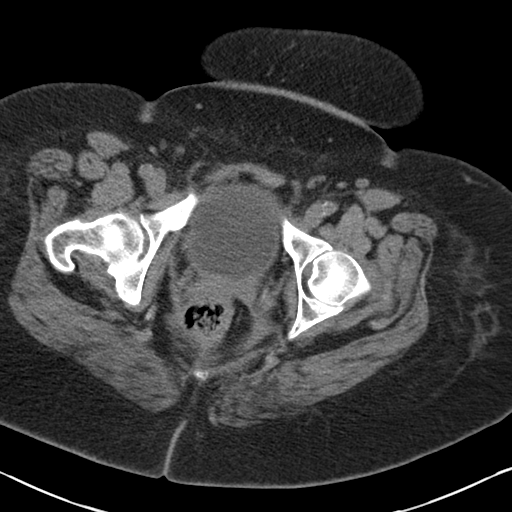
[im 18/86  soft-tissue]
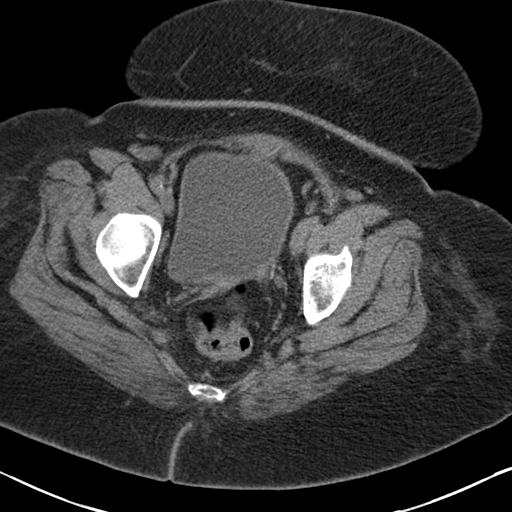
[im 26/86  soft-tissue]
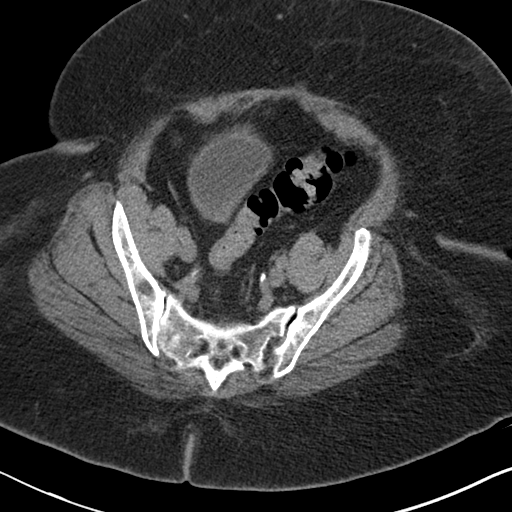
[im 30/86  soft-tissue]
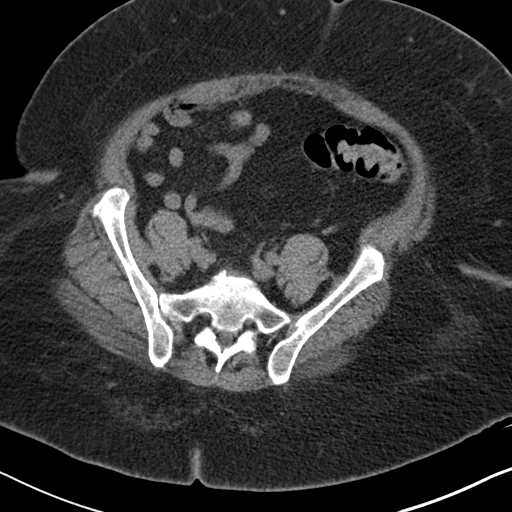
[im 39/86  soft-tissue]
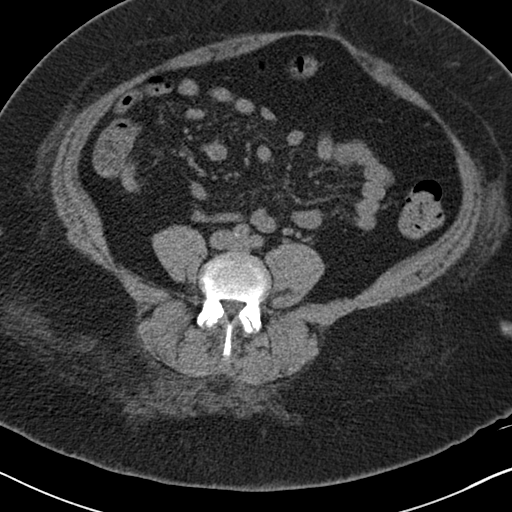
[im 43/86  soft-tissue]
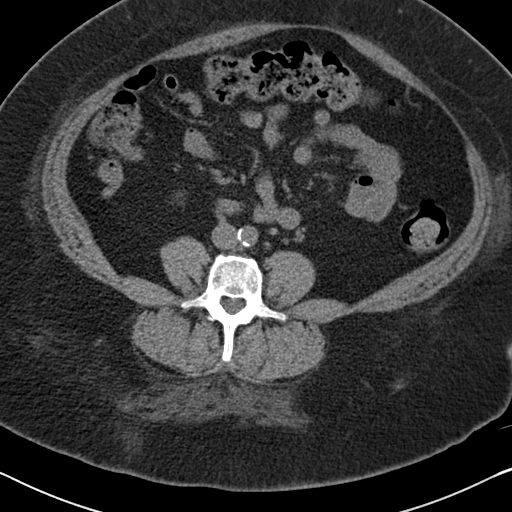
[im 47/86  soft-tissue]
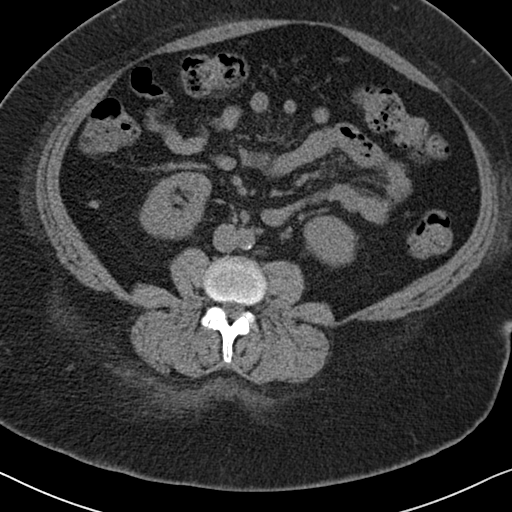
[im 56/86  soft-tissue]
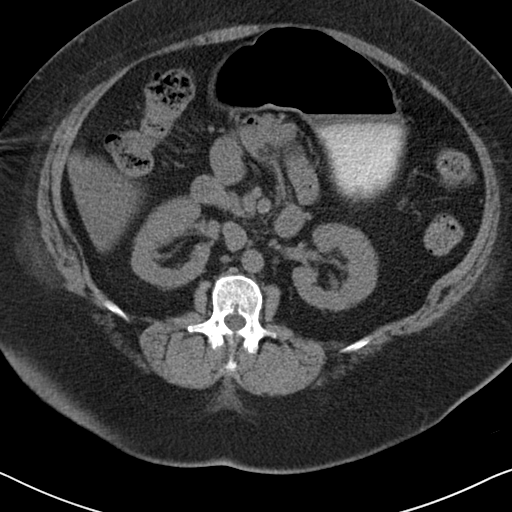
[im 56/86  bone]
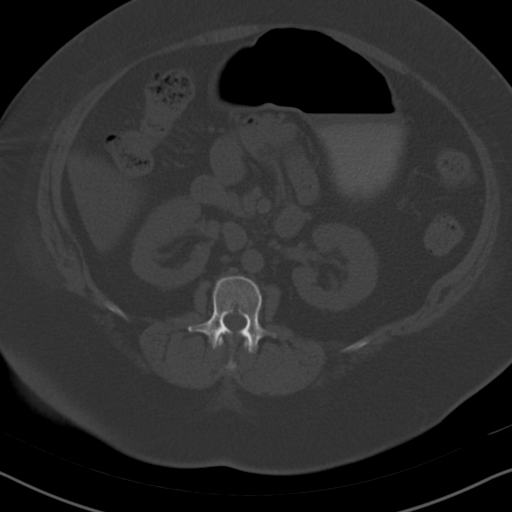
[im 60/86  soft-tissue]
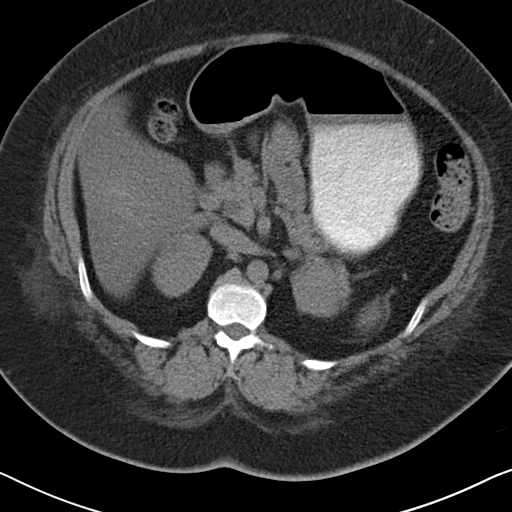
[im 69/86  soft-tissue]
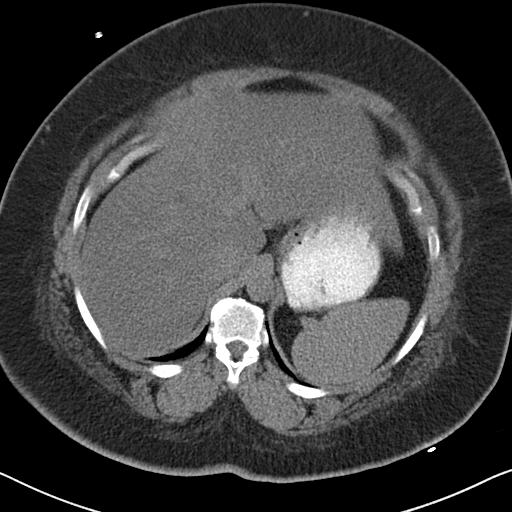
[im 73/86  soft-tissue]
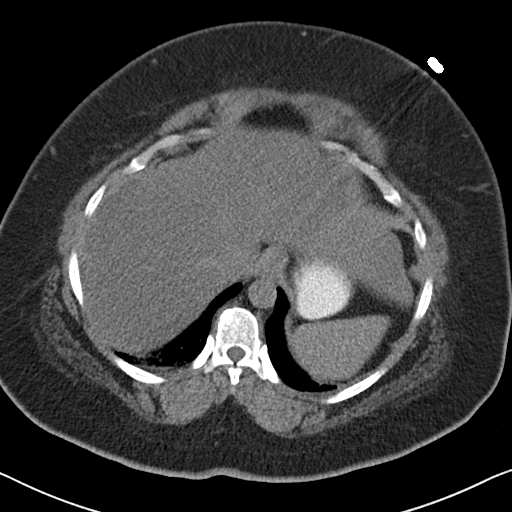
[im 81/86  soft-tissue]
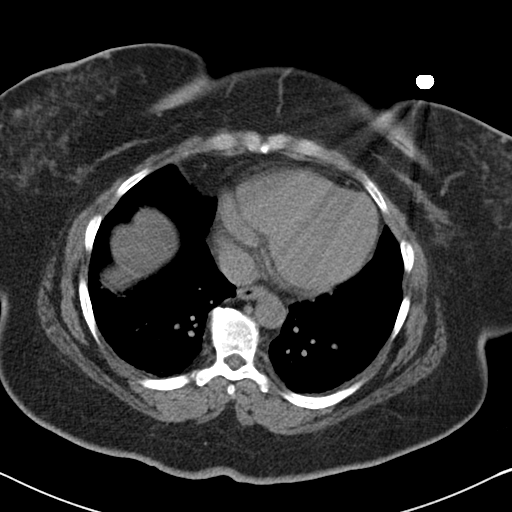

[Series 4: coronal st · coronal · 0.77mm/px · 3 of 108 slices shown]
[im 36/108  soft-tissue]
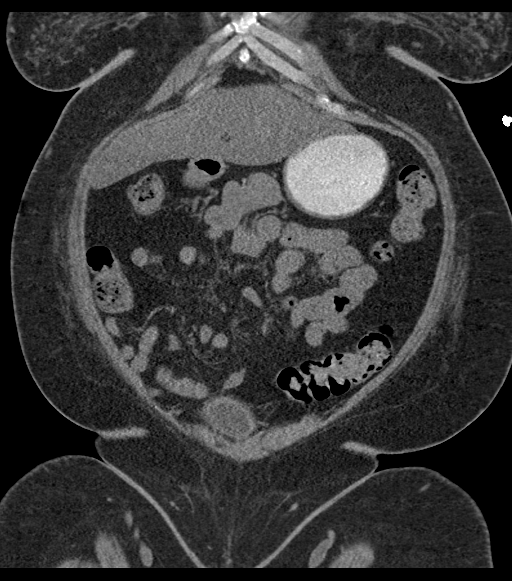
[im 48/108  soft-tissue]
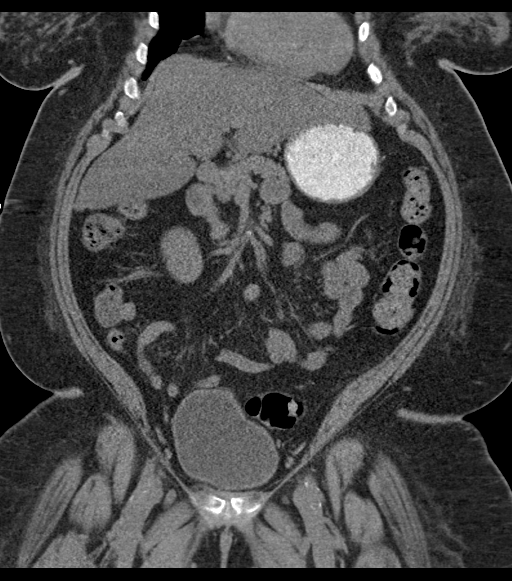
[im 60/108  soft-tissue]
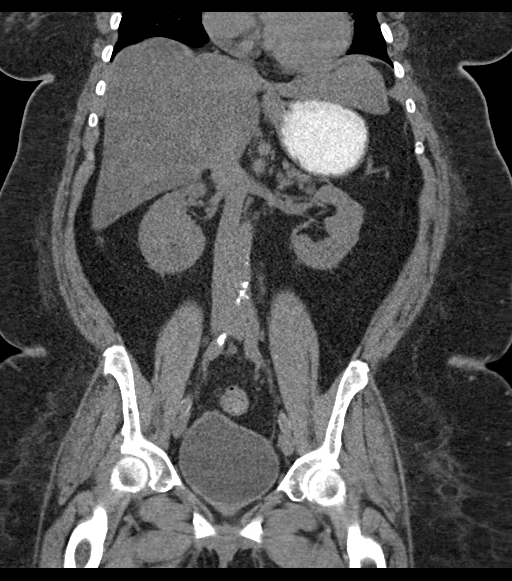

[16 of 46 positions shown; findings below may reference images not displayed]

FINDINGS: Lower chest: Scattered atelectasis. No pleural fluid or confluent
airspace disease.

Hepatobiliary: Progressive steatosis from prior exam, advanced. The
liver is prominent size. The previous vague low-density in the right
hepatic lobe is not as well seen on the current exam. Clips in the
gallbladder fossa postcholecystectomy. No biliary dilatation.

Pancreas: No ductal dilatation or inflammation.

Spleen: Normal in size without focal abnormality.

Adrenals/Urinary Tract: Normal adrenal glands. No hydronephrosis. No
renal stones. Both ureters are decompressed without stones along the
course. Urinary bladder is physiologically distended without stone
or wall thickening.

Stomach/Bowel: Small hiatal hernia. Stomach distended with ingested
contrast. No bowel wall thickening, inflammatory change or
obstruction. Normal appendix. Moderate colonic stool burden.

Vascular/Lymphatic: Aortic atherosclerosis without aneurysm. No
abdominopelvic adenopathy.

Reproductive: Status post hysterectomy. No adnexal masses.

Other: No free air or free fluid.  No intra-abdominal abscess.

Musculoskeletal: There are no acute or suspicious osseous
abnormalities. Facet arthropathy in the lower lumbar spine.
Scattered bone islands in the pelvis, unchanged.
IMPRESSION: 1. No acute findings.  No renal stone or obstructive uropathy.
2. Progressive hepatic steatosis from prior exam.
3.  Aortic Atherosclerosis (CF4HS-009.9).

## 2020-04-08 IMAGING — CR DG CHEST 2V
2 series · 2 of 2 positions shown · non-contrast
Comparison: CT 02/13/2017.  Chest x-ray 02/04/2017.

CLINICAL DATA: Right flank pain.  Nausea vomiting.  Constipation.

EXAM:
CHEST - 2 VIEW

[w chest lat]
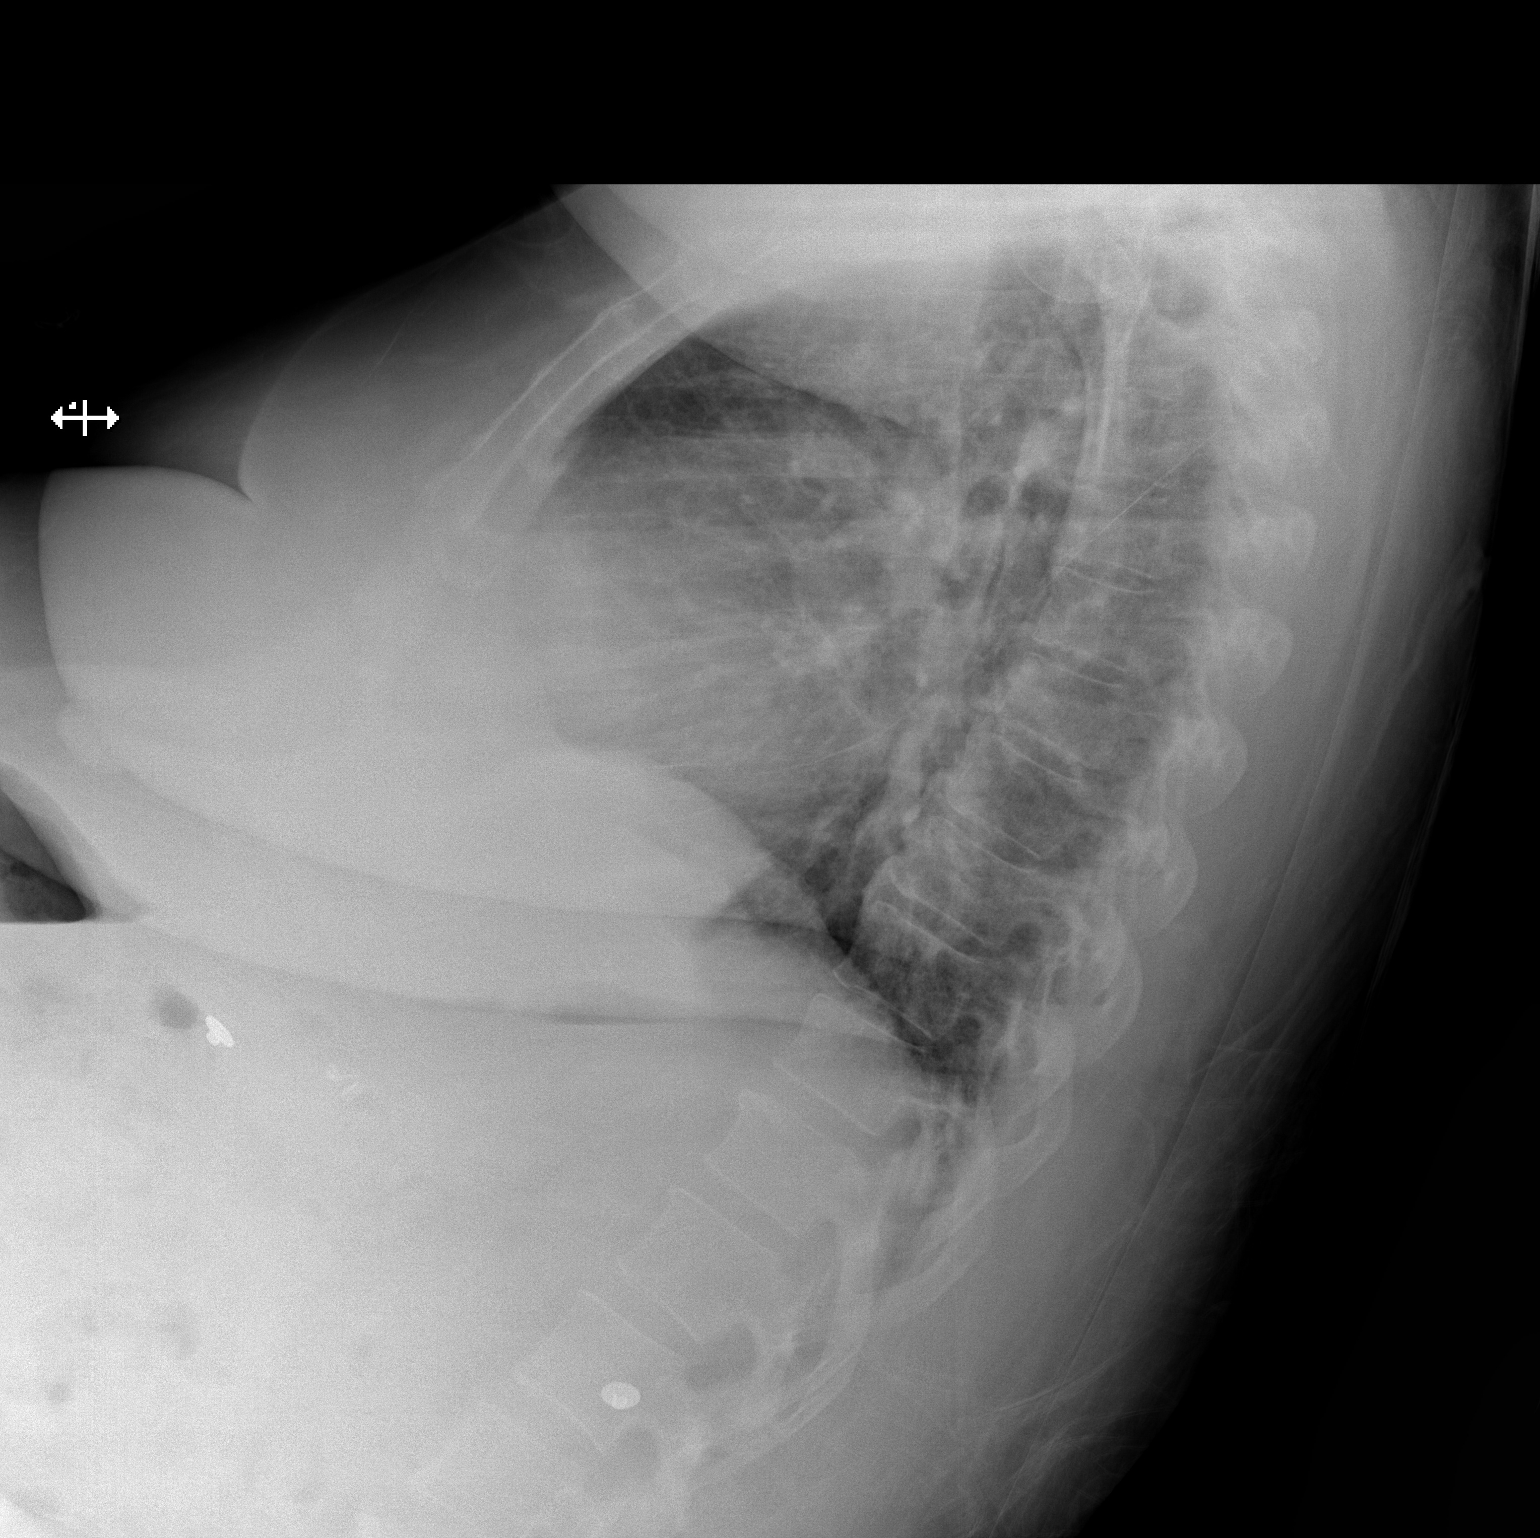

[x chest ap]
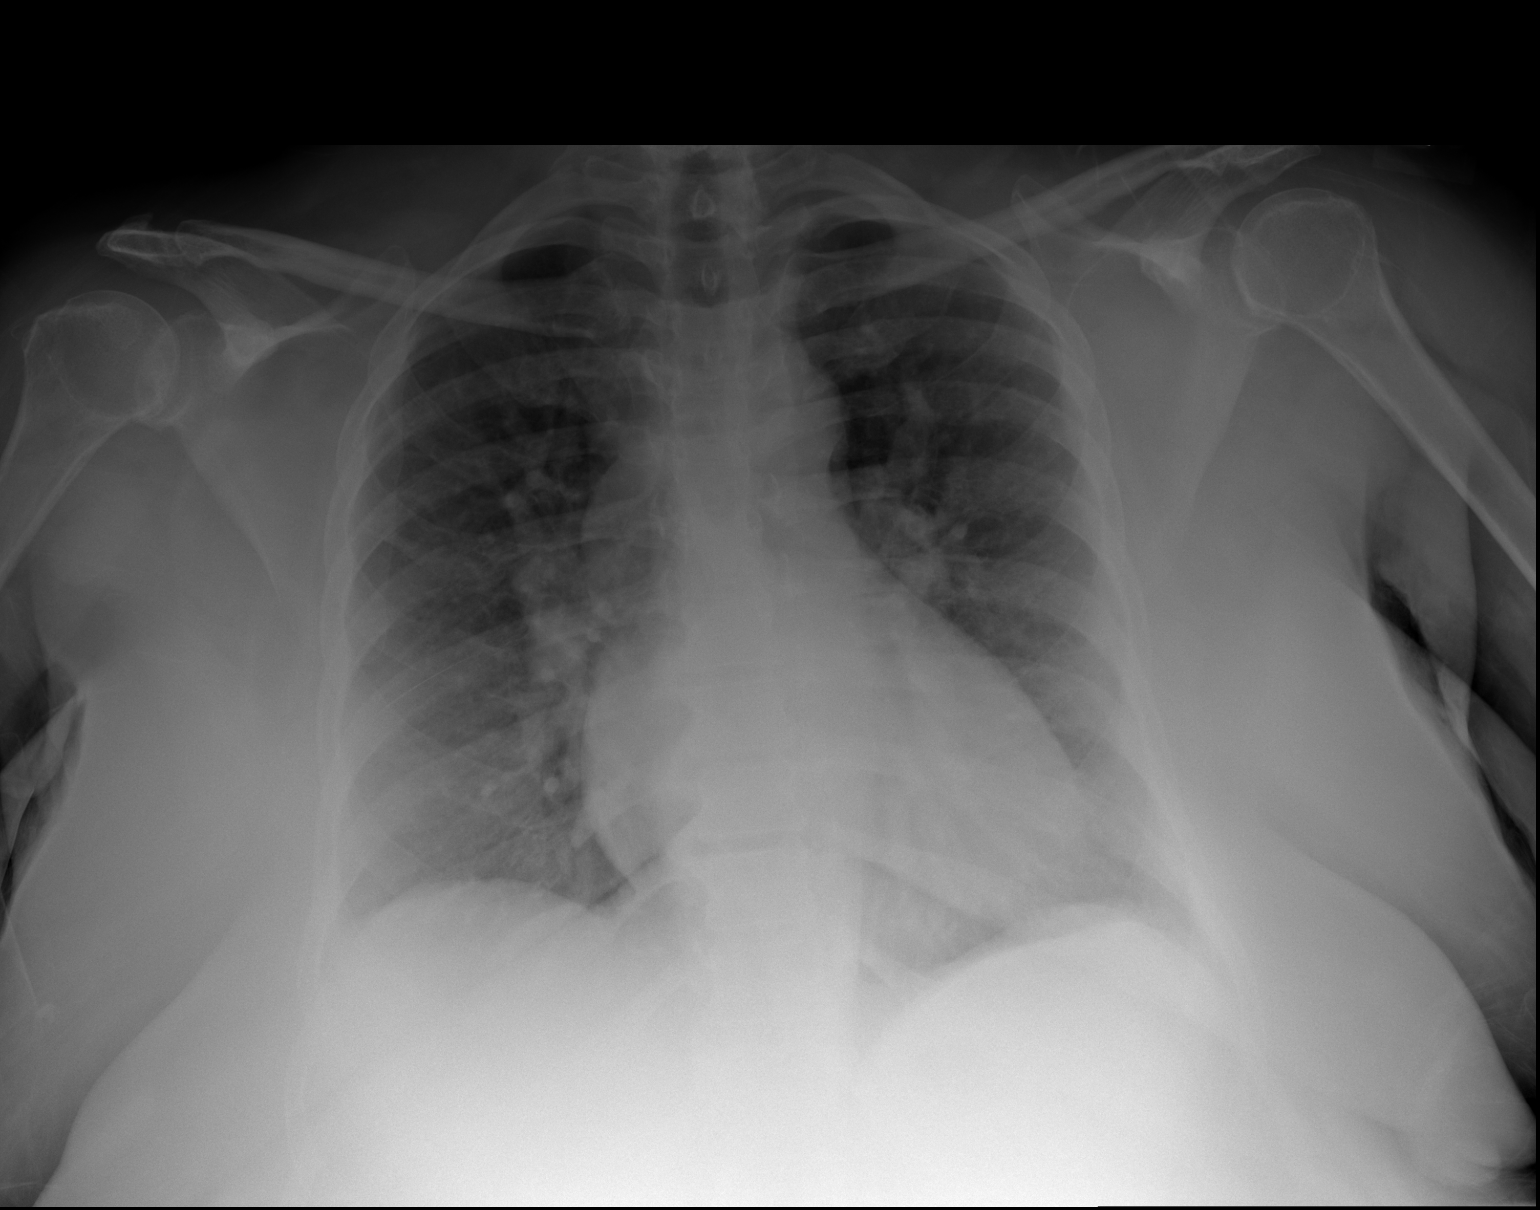

[2 of 2 positions shown; findings below may reference images not displayed]

FINDINGS: Mediastinum and hilar structures are normal. Borderline cardiomegaly
with mild pulmonary vascular prominence. No focal infiltrate. No
pleural effusion or pneumothorax. No acute bony abnormality.
IMPRESSION: Borderline cardiomegaly with mild pulmonary vascular prominence. No
acute pulmonary disease.

## 2020-04-13 ENCOUNTER — Encounter: Payer: Self-pay | Admitting: Family Medicine

## 2020-04-13 ENCOUNTER — Other Ambulatory Visit: Payer: Self-pay

## 2020-04-13 ENCOUNTER — Ambulatory Visit (INDEPENDENT_AMBULATORY_CARE_PROVIDER_SITE_OTHER): Payer: Medicare Other | Admitting: Family Medicine

## 2020-04-13 VITALS — BP 142/80 | HR 81 | Temp 98.3°F | Ht 59.0 in | Wt 229.0 lb

## 2020-04-13 DIAGNOSIS — E118 Type 2 diabetes mellitus with unspecified complications: Secondary | ICD-10-CM | POA: Diagnosis not present

## 2020-04-13 DIAGNOSIS — E039 Hypothyroidism, unspecified: Secondary | ICD-10-CM

## 2020-04-13 DIAGNOSIS — Z794 Long term (current) use of insulin: Secondary | ICD-10-CM

## 2020-04-13 DIAGNOSIS — Z1159 Encounter for screening for other viral diseases: Secondary | ICD-10-CM | POA: Diagnosis not present

## 2020-04-13 DIAGNOSIS — N189 Chronic kidney disease, unspecified: Secondary | ICD-10-CM | POA: Diagnosis not present

## 2020-04-13 DIAGNOSIS — I5032 Chronic diastolic (congestive) heart failure: Secondary | ICD-10-CM

## 2020-04-13 MED ORDER — FREESTYLE LIBRE 2 READER DEVI: 0 refills | Status: DC

## 2020-04-13 NOTE — Progress Notes (Signed)
Subjective:  Patient ID: Amy [name], female    DOB: 10-18-1960  Age: 58 y.o. MRN: [id number]  CC:  Chief Complaint  Patient presents with  . Establish Care    pt reports she is feeling pretty good, but has been having knne and back pain as an on going issue. pt reports seeing physical therpy for this issue.Pt reports she was using a libra BS monitor before and she would like to continue using it if possible.    HPI Amy [name] presents for   New patient to me to establish care.  Prior PCP [facility]. Had not been seen in some time.  History of insulin depenedent diabetes complicated by neuropathy, nephropathy, retinopathy gastroparesis per problem list.  Also history of asthma, chronic diastolic heart failure, OSA on CPAP, hypothyroidism, hyperlipidemia, spinal stenosis, alopecia, chronic renal sufficiency, obesity.  Has been undergoing physical therapy for knee and back pain.  Diabetes: Type 2? Prior pt of [facility] [place] in past (few months ago) and Dr. Ardeth Perfect - requests different group.  As above complicated by neuropathy, nephropathy, retinopathy, gastroparesis. Had freestyle Libre to check blood sugar.  No recent readings.  Treated with Tresiba 54 units per day.   Humalog 8 in am, 12 at lunch, 14 at dinner.  She is on statin with Zocor 20 mg daily, no ACE inhibitor Lyrica 150 mg 3 times daily for neuropathy.some burning in feet. Uses rollator for ambulation.  linzess only as needed for constipation.  Lives by self, has home health aide Monday-Sunday.   Lab Results  Component Value Date   HGBA1C 9.9 (H) 04/13/2020   HGBA1C 10.7 (H) 11/02/2018   HGBA1C 9.3 (H) 12/11/2017   Lab Results  Component Value Date   MICROALBUR 9.74 (H) 10/30/2012   LDLCALC 66 04/13/2020   CREATININE 1.58 (H) 04/13/2020   Hypothyroidism: Lab Results  Component Value Date   TSH 2.270 04/13/2020  Synthroid 150 mcg daily.   Chronic diastolic heart  failure torsemide 40mg  BID,  potassium 94meq qd, spirinolactone 25mg  - 1/2 tab per day.   GERD: nexium 40mg  qd.   History Patient Active Problem List   Diagnosis Date Noted  . 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 Pre-operative cardiovascular examination 02/18/2019  . Septic bursitis of elbow, right 10/30/2018  . CRI (chronic renal insufficiency), stage 3 (moderate) 07/26/2018  . Morbid obesity ([place]) 07/26/2018  . Intractable nausea and vomiting 03/06/2018  . Right leg pain 12/19/2017  . Leukocytosis 12/19/2017  . Benign positional vertigo 12/19/2017  . Weakness 12/12/2017  . Laryngopharyngeal reflux (LPR) 10/19/2017  . Post-nasal drainage 10/19/2017  . Acute sinusitis 10/19/2017  . Degeneration of lumbar intervertebral disc 07/29/2017  . 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 Obstructive sleep apnea on CPAP 02/09/2017  . Chronic diastolic heart failure ([place]) 11/08/2016  . Near syncope 01/30/2016  . SOB (shortness of breath)   . CAP (community acquired pneumonia) 09/12/2015  . Hypoxia 09/12/2015  . Essential hypertension 07/05/2015  . Hypotension 07/05/2015  . Diabetes mellitus with neurological manifestations ([place]) 11/04/2014  . Hyperlipidemia LDL goal <70 11/04/2014  . Spinal stenosis, multilevel 11/04/2014  . Hyperkalemia 11/01/2014  . Hypoglycemia 08/09/2014  . Type 2 diabetes mellitus with complication, with long-term current use of insulin ([place]) 08/08/2014  .  Elevated troponin 08/08/2014  . Nausea vomiting and diarrhea 08/08/2014  . 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 Dizziness 08/06/2012  . Orthostatic hypotension 08/06/2012  . Hypernatremia 08/06/2012  . Acute gastroenteritis 08/13/2011  .  Gastroparesis 08/13/2011  . Low back pain 08/13/2011  . Oral thrush 08/13/2011  . Gastroenteritis 05/23/2011  . Dehydration 05/23/2011  . Blindness 05/23/2011  . DM type 1, not at goal, causing eye disease ([place]) 05/23/2011  . Asthma 05/23/2011  . Diarrhea 05/23/2011  . Vomiting 05/23/2011  . Hypothyroidism 05/23/2011  . Diabetic neuropathy ([employer]) 05/23/2011  . Diabetic nephropathy ([place]) 05/23/2011   Past Medical History:  Diagnosis Date  . Arthritis   . Asthma   . Blind   . Blindness and low vision    left eye glass eye,  legally blind in right eye  . Diabetes mellitus   . Diabetic neuropathy ([place])   . Hyperlipidemia   . Hypertension   . Hypothyroidism   . Spinal stenosis    Past Surgical History:  Procedure Laterality Date  . ABDOMINAL HYSTERECTOMY  1990  . [place]   x 2  . CHOLECYSTECTOMY  2008  . ENUCLEATION Bilateral 09/15/1998  . EYE SURGERY  2016   fitting artificial eye   Allergies  Allergen Reactions  . Penicillins Hives and Swelling    Has patient had a PCN reaction causing immediate rash, facial/tongue/throat swelling, SOB or lightheadedness with hypotension: yes- face swelling Has patient had a PCN reaction causing severe rash involving mucus membranes or skin necrosis: no Has patient had a PCN reaction that required hospitalization unknown (childhood allergy) Has patient had a PCN reaction occurring within the last 10 years: no If all of the above answers are "NO", then may proceed with Cephalosporin use.   . Tramadol Nausea And Vomiting    Intense nausea  . Vicodin [Hydrocodone-Acetaminophen] Nausea And Vomiting  . Codeine Nausea Only  . Iodine-131 Hives  . Iohexol Hives    Pt developed itching and hives along with nasal congestion; needs 13 hour premeds for future studies, Onset Date: [id number]   . Lisinopril Cough  . Sulfa Antibiotics Nausea And Vomiting   Prior to Admission medications   Medication Sig Start Date End Date  Taking? Authorizing Provider  albuterol (PROVENTIL HFA;VENTOLIN HFA) 108 (90 BASE) MCG/ACT inhaler Inhale 2 puffs into the lungs every 4 (four) hours as needed for wheezing or shortness of breath. For short   Yes [provider]  aspirin EC 81 MG tablet Take 81 mg by mouth daily.   Yes [provider]  B-D UF III MINI PEN NEEDLES 31G X 5 MM MISC U 1 PEN NEEDLE QID WITH EACH INJECTION 08/01/18  Yes [provider]  cetirizine (ZYRTEC) 10 MG tablet Take 10 mg by mouth daily.   Yes [provider]  DOXYCYCLINE HYCLATE PO Take by mouth.   Yes [provider]  esomeprazole (NEXIUM) 40 MG capsule Take 40 mg by mouth daily.    Yes [provider]  furosemide (LASIX) 80 MG tablet Take 80 mg by mouth daily.   Yes [provider]  HUMALOG KWIKPEN 100 UNIT/ML KiwkPen Inject 8-14 Units into the skin See admin instructions. Inject 8 units in morning, 12 units at lunch, and 14 units at dinner 01/24/17  Yes [provider]  levothyroxine (SYNTHROID, LEVOTHROID) 150 MCG tablet Take 150 mcg by mouth daily before breakfast.   Yes [provider]  Linaclotide (LINZESS) 145 MCG CAPS capsule Take 145 mcg by mouth daily as needed (constipation).    Yes [provider]  metolazone (ZAROXOLYN) 2.5 MG tablet Take 1 tablet (2.5 mg total) by mouth daily. 07/26/18  Yes Kilroy, Luke K, PA-C  naproxen (NAPROSYN) 500 MG tablet Take 500 mg by mouth 2 (two) times daily with a meal.   Yes [provider]  ondansetron (ZOFRAN ODT) 4 MG disintegrating tablet Take 1 tablet (4 mg total) by mouth every 8 (eight) hours as needed for nausea or vomiting. 07/09/19  Yes Antonietta Breach, PA-C  potassium chloride SA (KLOR-CON) 20 MEQ tablet TAKE 1 TABLET BY MOUTH DAILY 02/07/20  Yes Bensimhon, Shaune Pascal, MD  pregabalin (LYRICA) 150 MG capsule Take 150 mg by mouth 3 (three) times daily.  01/01/18  Yes [provider]  simvastatin (ZOCOR) 20 MG tablet  Take 20 mg by mouth every evening.    Yes [provider]  spironolactone (ALDACTONE) 25 MG tablet TAKE 1/2 TABLET BY MOUTH DAILY Patient taking differently: Take 12.5 mg by mouth once.  01/30/19  Yes Bensimhon, Shaune Pascal, MD  torsemide (DEMADEX) 20 MG tablet TAKE 4 TABLETS (80 MG TOTAL) BY MOUTH DAILY. 02/07/20  Yes Bensimhon, Shaune Pascal, MD  TRESIBA FLEXTOUCH 200 UNIT/ML SOPN Inject 60 Units into the skin at bedtime.  05/30/18  Yes [provider]   Social History   Socioeconomic History  . Marital status: Single    Spouse name: Not on file  . Number of children: 2  . Years of education: 9  . Highest education level: Not on file  Occupational History  . Occupation: N/A    Comment: disabled  Tobacco Use  . Smoking status: Former Smoker    Types: Cigarettes    Quit date: 05/22/1992    Years since quitting: 27.9  . Smokeless tobacco: Never Used  Vaping Use  . Vaping Use: Never used  Substance and Sexual Activity  . Alcohol use: No    Comment: quit 20 yrs ago  . Drug use: No  . Sexual activity: Not Currently  Other Topics Concern  . Not on file  Social History Narrative   Lives alone   caffeine drinks 2 cups of coffee a day, occasional soda    Social Determinants of Health   Financial Resource Strain: Medium Risk  . Difficulty of Paying Living Expenses: Somewhat hard  Food Insecurity: No Food Insecurity  . Worried About [name] in the Last Year: Never true  . Ran Out of Food in the Last Year: Never true  Transportation Needs: No Transportation Needs  . Lack of Transportation (Medical): No  . Lack of Transportation (Non-Medical): No  Physical Activity:   . Days of Exercise per Week: Not on file  . Minutes of Exercise per Session: Not on file  Stress:   . Feeling of Stress : Not on file  Social Connections:   . Frequency of Communication with Friends and Family: Not on file  . Frequency of Social Gatherings with Friends and Family: Not on file   . Attends Religious Services: Not on file  . Active Member of Clubs or Organizations: Not on file  . Attends Archivist Meetings: Not on file  . Marital Status: Not on file  Intimate Partner Violence:   . Fear of Current or Ex-Partner: Not on file  . Emotionally Abused: Not on file  . Physically Abused: Not on file  . Sexually Abused: Not  on file    Review of Systems  Respiratory: Negative for shortness of breath.   Cardiovascular: Positive for leg swelling. Negative for chest pain.     Objective:   Vitals:   04/13/20 1021 04/13/20 1042  BP: (!) 153/85 (!) 142/80  Pulse: 81   Temp: 98.3 F (36.8 C)   TempSrc: Temporal   SpO2: 90%   Weight: 229 lb (103.9 kg)   Height: 4\' 11"  (1.499 m)      Physical Exam Vitals reviewed.  Constitutional:      Appearance: She is well-developed.  HENT:     Head: Normocephalic and atraumatic.  Eyes:     Conjunctiva/sclera: Conjunctivae normal.     Pupils: Pupils are equal, round, and reactive to light.  Neck:     Vascular: No carotid bruit.  Cardiovascular:     Rate and Rhythm: Normal rate and regular rhythm.     Heart sounds: Normal heart sounds.  Pulmonary:     Effort: Pulmonary effort is normal.     Breath sounds: Normal breath sounds.  Abdominal:     Palpations: Abdomen is soft. There is no pulsatile mass.     Tenderness: There is no abdominal tenderness.  Musculoskeletal:     Right lower leg: Edema (1-2+bilt mid tibia. ) present.     Left lower leg: Edema present.  Skin:    General: Skin is warm and dry.  Neurological:     Mental Status: She is alert and oriented to person, place, and time.  Psychiatric:        Behavior: Behavior normal.     38 minutes spent during visit, greater than 50% counseling and assimilation of information, chart review, and discussion of plan.   Assessment & Plan:  Amy [name] is a 59 y.o. female . Type 2 diabetes mellitus with complication, with long-term current use of  insulin ([place]) - Plan: Hemoglobin A1c, Ambulatory referral to Endocrinology, Comprehensive metabolic panel, Lipid panel, Continuous Blood Gluc Receiver (FREESTYLE LIBRE 2 READER) DEVI  -Given current regimen, associated complications, will refer her to endocrinology for ongoing diabetic treatment, check A1c, freestyle libre ordered per her request-printed for whichever pharmacy she chooses.  Encounter for hepatitis C screening test for low risk patient - Plan: Hepatitis C antibody  Hypothyroidism, unspecified type - Plan: TSH  -Check updated TSH, no med changes at this time  Chronic kidney disease, unspecified CKD stage  -Avoid nephrotoxins, ongoing monitoring, diabetic follow-up as above.  Chronic diastolic heart failure (HCC) - Plan: Comprehensive metabolic panel  -Suspect some chronic pedal edema, denies recent changes.  Clarified regimen as above.  May need cardiology follow-up,  follow-up office visit in next month, plan to try to obtain previous provider records for that visit.  We'll complete paperwork for transportation assistance.  Meds ordered this encounter  Medications  . Continuous Blood Gluc Receiver (FREESTYLE LIBRE 2 READER) DEVI    Sig: As instructed. iddm with complications - retinopathy, neuropathy, nephropathy. With device and sensors.    Dispense:  1 each    Refill:  0   Patient Instructions   Nice meeting you today. I will try to get some records from your primary provider. Follow up in 1 month. Let me know if any refills needed prior to that visit. Make sure your med list is same as medicines the way you take them at home.  I can work on paperwork for [employer] bus transportation.  I will refer you to endocrinology.  Return to  the clinic or go to the nearest emergency room if any of your symptoms worsen or new symptoms occur.     If you have lab work done today you will be contacted with your lab results within the next 2 weeks.  If you have not heard from [place] then  please contact us. The fastest way to get your results is to register for My Chart.   IF you received an x-ray today, you will receive an invoice from [facility]. Please contact [facility] at [phone] with questions or concerns regarding your invoice.   IF you received labwork today, you will receive an invoice from [facility]. Please contact [facility] at [phone] with questions or concerns regarding your invoice.   Our billing staff will not be able to assist you with questions regarding bills from these companies.  You will be contacted with the lab results as soon as they are available. The fastest way to get your results is to activate your My Chart account. Instructions are located on the last page of this paperwork. If you have not heard from [place] regarding the results in 2 weeks, please contact this office.          Signed, Merri Ray, MD [facility]

## 2020-04-13 NOTE — Patient Instructions (Signed)
Nice meeting you today. I will try to get some records from your primary provider. Follow up in 1 month. Let me know if any refills needed prior to that visit. Make sure your med list is same as medicines the way you take them at home.  I can work on paperwork for [employer] bus transportation.  I will refer you to endocrinology.  Return to the clinic or go to the nearest emergency room if any of your symptoms worsen or new symptoms occur.     If you have lab work done today you will be contacted with your lab results within the next 2 weeks.  If you have not heard from [place] then please contact us. The fastest way to get your results is to register for My Chart.   IF you received an x-ray today, you will receive an invoice from [facility]. Please contact [facility] at [phone] with questions or concerns regarding your invoice.   IF you received labwork today, you will receive an invoice from [facility]. Please contact [facility] at [phone] with questions or concerns regarding your invoice.   Our billing staff will not be able to assist you with questions regarding bills from these companies.  You will be contacted with the lab results as soon as they are available. The fastest way to get your results is to activate your My Chart account. Instructions are located on the last page of this paperwork. If you have not heard from [place] regarding the results in 2 weeks, please contact this office.

## 2020-04-14 ENCOUNTER — Encounter: Payer: Self-pay | Admitting: Physical Therapy

## 2020-04-14 ENCOUNTER — Ambulatory Visit: Payer: Medicare Other | Admitting: Physical Therapy

## 2020-04-14 ENCOUNTER — Other Ambulatory Visit: Payer: Self-pay | Admitting: Family Medicine

## 2020-04-14 ENCOUNTER — Other Ambulatory Visit: Payer: Self-pay | Admitting: Podiatry

## 2020-04-14 ENCOUNTER — Encounter: Payer: Self-pay | Admitting: Family Medicine

## 2020-04-14 DIAGNOSIS — R262 Difficulty in walking, not elsewhere classified: Secondary | ICD-10-CM

## 2020-04-14 DIAGNOSIS — M6281 Muscle weakness (generalized): Secondary | ICD-10-CM

## 2020-04-14 DIAGNOSIS — Z1231 Encounter for screening mammogram for malignant neoplasm of breast: Secondary | ICD-10-CM

## 2020-04-14 DIAGNOSIS — L97519 Non-pressure chronic ulcer of other part of right foot with unspecified severity: Secondary | ICD-10-CM

## 2020-04-14 DIAGNOSIS — G8929 Other chronic pain: Secondary | ICD-10-CM

## 2020-04-14 DIAGNOSIS — M25652 Stiffness of left hip, not elsewhere classified: Secondary | ICD-10-CM

## 2020-04-14 DIAGNOSIS — M5441 Lumbago with sciatica, right side: Secondary | ICD-10-CM | POA: Diagnosis not present

## 2020-04-14 LAB — COMPREHENSIVE METABOLIC PANEL
ALT: 14 IU/L (ref 0–32)
AST: 11 IU/L (ref 0–40)
Albumin/Globulin Ratio: 1.1 — ABNORMAL LOW (ref 1.2–2.2)
Albumin: 4 g/dL (ref 3.8–4.8)
Alkaline Phosphatase: 139 IU/L — ABNORMAL HIGH (ref 44–121)
BUN/Creatinine Ratio: 30 — ABNORMAL HIGH (ref 12–28)
BUN: 48 mg/dL — ABNORMAL HIGH (ref 8–27)
Bilirubin Total: 0.2 mg/dL (ref 0.0–1.2)
CO2: 29 mmol/L (ref 20–29)
Calcium: 10 mg/dL (ref 8.7–10.3)
Chloride: 101 mmol/L (ref 96–106)
Creatinine, Ser: 1.58 mg/dL — ABNORMAL HIGH (ref 0.57–1.00)
GFR calc Af Amer: 40 mL/min/{1.73_m2} — ABNORMAL LOW (ref >59)
GFR calc non Af Amer: 35 mL/min/{1.73_m2} — ABNORMAL LOW (ref >59)
Globulin, Total: 3.7 g/dL (ref 1.5–4.5)
Glucose: 361 mg/dL — ABNORMAL HIGH (ref 65–99)
Potassium: 4.9 mmol/L (ref 3.5–5.2)
Sodium: 143 mmol/L (ref 134–144)
Total Protein: 7.7 g/dL (ref 6.0–8.5)

## 2020-04-14 LAB — HEMOGLOBIN A1C
Est. average glucose Bld gHb Est-mCnc: 237 mg/dL
Hgb A1c MFr Bld: 9.9 % — ABNORMAL HIGH (ref 4.8–5.6)

## 2020-04-14 LAB — LIPID PANEL
Chol/HDL Ratio: 3 ratio (ref 0.0–4.4)
Cholesterol, Total: 126 mg/dL (ref 100–199)
HDL: 42 mg/dL (ref >39)
LDL Chol Calc (NIH): 66 mg/dL (ref 0–99)
Triglycerides: 95 mg/dL (ref 0–149)
VLDL Cholesterol Cal: 18 mg/dL (ref 5–40)

## 2020-04-14 LAB — HEPATITIS C ANTIBODY: Hep C Virus Ab: <0.1 s/co ratio (ref 0.0–0.9)

## 2020-04-14 LAB — TSH: TSH: 2.27 u[IU]/mL (ref 0.450–4.500)

## 2020-04-14 NOTE — Patient Instructions (Signed)
     Voncille Lo, PT, [place] Certified Exercise Expert for the Aging Adult  04/14/20 12:31 PM Phone: [phone] Fax: [phone]

## 2020-04-14 NOTE — Therapy (Signed)
[place], [place], [address] Phone: [phone]   Fax:  [phone]  Physical Therapy Treatment  Patient Details  Name: Amy [name] MRN: [id number] Date of Birth: 11-04-1960 Referring Provider (PT): Leonie Green, NP   Encounter Date: 04/14/2020   PT End of Session - 04/14/20 1150    Visit Number 6    Number of Visits 13    Date for PT Re-Evaluation 05/19/20    Authorization Type UHC MCR/MCD, recheck FOTO status by visit 6, progress note by visit 10    PT Start Time 1150    PT Stop Time 1233    PT Time Calculation (min) 43 min    Activity Tolerance Patient tolerated treatment well    Behavior During Therapy [facility] for tasks assessed/performed           Past Medical History:  Diagnosis Date  . Arthritis   . Asthma   . Blind   . Blindness and low vision    left eye glass eye,  legally blind in right eye  . Diabetes mellitus   . Diabetic neuropathy ([place])   . Hyperlipidemia   . Hypertension   . Hypothyroidism   . Spinal stenosis     Past Surgical History:  Procedure Laterality Date  . ABDOMINAL HYSTERECTOMY  1990  . Man   x 2  . CHOLECYSTECTOMY  2008  . ENUCLEATION Bilateral 09/15/1998  . EYE SURGERY  2016   fitting artificial eye    There were no vitals filed for this visit.   Subjective Assessment - 04/14/20 1153    Pertinent History visually impaired/blind, CHF, diabetic, OSA on CPAP, spinal stenosis    Limitations House hold activities;Lifting;Sitting;Standing;Walking    Currently in Pain? Yes    Pain Score 8     Pain Location Back    Pain Score 8    Pain Orientation Left   usually the R but today L             [facility] PT Assessment - 04/14/20 0001      Observation/Other Assessments   Focus on Therapeutic Outcomes (FOTO)  31% eval  intake Predicted 51% intake  Today 40%                         [facility] Adult PT Treatment/Exercise -  04/14/20 0001      Transfers   Supine to Sit 3: Mod assist    Supine to Sit Details Tactile cues for initiation;Tactile cues for weight shifting    Sit to Supine 3: Mod assist    Sit to Supine Details (indicate cue type and reason) Tactile cues for weight shifting;Tactile cues for weight beaing   x2     Lumbar Exercises: Stretches   Lower Trunk Rotation 5 reps;20 seconds    Lower Trunk Rotation Limitations PT assist stretch    Piriformis Stretch 3 reps;30 seconds    Piriformis Stretch Limitations with towel asssist in supine aide assiting      Lumbar Exercises: Standing   Other Standing Lumbar Exercises sink squat 10 x 1     Other Standing Lumbar Exercises 6 min walk with 90 min rest sitting and 2 rest breaks   389 ft      Lumbar Exercises: Supine   Pelvic Tilt 10 reps    Pelvic Tilt Limitations x2 VC and TC  needs extra time for correct execution and mod  cuing    Clam 15 reps    Clam Limitations with red t band    Bent Knee Raise 10 reps    Bent Knee Raise Limitations  R and L    Straight Leg Raise 5 reps    Straight Leg Raises Limitations with 2 min rest then 5 more on LT      Knee/Hip Exercises: Seated   Other Seated Knee/Hip Exercises green t band resisted knee ext and flexion 15 each R and L      Manual Therapy   Manual Therapy Passive ROM    Passive ROM aggressive Stretching of bil hip capsules bil                  PT Education - 04/14/20 1231    Education Details Reveiwed and added to HEP  Foto done and went over report improvement    Person(s) Educated Patient;Other (comment)   caregiver Tia   Methods Explanation;Demonstration;Tactile cues;Verbal cues;Handout    Comprehension Verbalized understanding;Returned demonstration            PT Short Term Goals - 04/14/20 1239      PT SHORT TERM GOAL #1   Title Pt will be consistent about walking/ standing  for 15 to 20 minutes every day with aide    Baseline Pt able to walk 397ft over 6 min with rest..  .limited by endurance    Time 3    Period Weeks    Status On-going    Target Date 04/14/20      PT SHORT TERM GOAL #2   Title Pt will be consisistent with HEP every day especially sink squats 3 x a day for movement snacks    Baseline Pt not consistent with exercises    Time 3    Status On-going    Target Date 04/14/20      PT SHORT TERM GOAL #3   Title Pt will demonstrate 5 x sts with improvement in lower extremity strength at 40 seconds or less    Baseline 76 sec 5 x STS eval    Time 3    Period Weeks    Status On-going             PT Long Term Goals - 03/24/20 1202      PT LONG TERM GOAL #1   Title Independent with HEP    Baseline Pt has been unable to come since 02-11-20 but returns with same pain    Time 8    Period Weeks    Status On-going    Target Date 05/19/20      PT LONG TERM GOAL #2   Title Improve FOTO outcome measure score to 51% from 24%    Baseline intake 24%   limited  76%    Time 8    Period Weeks    Status On-going    Target Date 05/19/20      PT LONG TERM GOAL #3   Title Tolerate right sidelying position for sleep with sleep disturbance symptoms/pain decreased 50% or greater from current status    Baseline still not able to lie on RT side now. must be on back    Time 8    Period Weeks    Status On-going    Target Date 05/19/20      PT LONG TERM GOAL #4   Title Tolerate sitting at least 30 min for use of transportation and sitting to eat meals with pain <5/10  Baseline Pt can sit only for 15 minutes of less at 8/10    Time 8    Period Weeks    Status On-going    Target Date 05/19/20      PT LONG TERM GOAL #5   Title Increase trunk flexion AROM at least 20-30 deg to improve ability for dressing, donning shoes    Baseline pt unable to bring ankle to knee in sitting. unable to bend forward without 8/10 pain8    Time 8    Period Weeks    Status On-going    Target Date 05/19/20           Access Code: [email or web address]:  [email or web address]: 11/30/2021Prepared by: Donnetta Simpers BeardsleyExercises  Supine Posterior Pelvic Tilt - 1 x daily - 7 x weekly - 2 sets - 10 reps - 3-5 seconds hold  Lower Trunk Rotations - 1-2 x daily - 7 x weekly - 1-2 sets - 10 reps - 5-10 seconds hold  Supine Piriformis Stretch with Foot on Ground - 1 x daily - 7 x weekly - 1 sets - 2-3 reps - 20-30 seconds hold  Sit to Stand with Arm Reach Toward Target - 1 x daily - 7 x weekly - 3 sets - 10 reps  Supine March - 1 x daily - 7 x weekly - 3 sets - 10 reps  Seated Knee Extension with Resistance - 1 x daily - 7 x weekly - 2 sets - 10 reps  Seated Knee Flexion with Anchored Resistance - 1 x daily - 7 x weekly - 2 sets - 10 reps  Hooklying Clamshell with Resistance - 1 x daily - 7 x weekly - 3 sets - 10 reps        Plan - 04/14/20 1208    Clinical Impression Statement Pt with 8/10 pain in back and L hip today.  ( originally R hip)  Pt does not seem to do exercises consisitently. at home.  aftere RX session in clinic Pt FOTO improved from 31% to 40 % (goal 51%) Pt states she has no pain and PT suspects she does not do a lot of movement at home. She is limited by her endurance and cardiopulmonary state. Able to walk 340ft in 6 minutes iwth rest breaks.  After stretching exercises pt has 0/10 pain.  Pt wil benefit from reinforcing HEP from memory for pt since she in visually impaired and will benefit from reinforcement of exercise each visit. she clearly does better with more movement  vigoraous stretching.    Personal Factors and Comorbidities Comorbidity 3+    Comorbidities blindness, balance issues, diabetic, CHF    Examination-Activity Limitations Bend;Lift;Squat;Sleep;Dressing;Bathing;Sit;Hygiene/Grooming;Bed Mobility;Locomotion Level;Carry;Stand    Examination-Participation Restrictions Shop;Community Activity;Cleaning;Meal Prep    PT Frequency 2x / week    PT Duration 8 weeks    PT Treatment/Interventions ADLs/Self Care  Home Management;Cryotherapy;Electrical Stimulation;Moist Heat;Functional mobility training;Neuromuscular re-education;Therapeutic exercise;Patient/family education;Manual techniques;Dry needling;Therapeutic activities;Gait training    PT Next Visit Plan work on encouraging walking and movement consistently  Visually impaired and needs reiniforcement of exercise. , LT left side emphasis, stretch piriformis, progress squatting  and mini squats as able, standing exercises, strengthening    PT Home Exercise Plan [email or web address]    Consulted and Agree with Plan of Care Patient;Other (Comment)           Patient will benefit from skilled therapeutic intervention in order to improve the following deficits and impairments:  Pain, Impaired flexibility, Decreased strength, Decreased activity tolerance, Decreased range of  motion, Decreased balance, Difficulty walking  Visit Diagnosis: Chronic right-sided low back pain with right-sided sciatica  Muscle weakness (generalized)  Difficulty in walking, not elsewhere classified  Stiffness of left hip, not elsewhere classified     Problem List Patient Active Problem List   Diagnosis Date Noted  . 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 Pre-operative cardiovascular examination 02/18/2019  . Septic bursitis of elbow, right 10/30/2018  . CRI (chronic renal insufficiency), stage 3 (moderate) 07/26/2018  . Morbid obesity ([place]) 07/26/2018  . Intractable nausea and vomiting 03/06/2018  . Right leg pain 12/19/2017  . Leukocytosis 12/19/2017  . Benign positional vertigo 12/19/2017  . Weakness 12/12/2017  . Laryngopharyngeal reflux (LPR) 10/19/2017  . Post-nasal drainage 10/19/2017  . Acute sinusitis 10/19/2017  . Degeneration of lumbar intervertebral disc 07/29/2017  . 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 Obstructive sleep apnea on CPAP 02/09/2017  . Chronic diastolic heart failure ([place]) 11/08/2016  . Near syncope 01/30/2016  . SOB (shortness of breath)   . CAP (community acquired pneumonia) 09/12/2015  . Hypoxia 09/12/2015  . Essential hypertension 07/05/2015  . Hypotension 07/05/2015  . Diabetes mellitus with neurological manifestations ([place]) 11/04/2014  . Hyperlipidemia LDL goal <70 11/04/2014  . Spinal stenosis, multilevel 11/04/2014  . Hyperkalemia 11/01/2014  . Hypoglycemia 08/09/2014  . Type 2 diabetes mellitus with complication, with long-term current use of insulin ([place]) 08/08/2014  . Elevated troponin 08/08/2014  . Nausea vomiting and diarrhea 08/08/2014  . 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 Dizziness 08/06/2012  . Orthostatic hypotension 08/06/2012  . Hypernatremia 08/06/2012  . Acute gastroenteritis 08/13/2011  . Gastroparesis 08/13/2011  . Low back pain 08/13/2011  . Oral thrush 08/13/2011  . Gastroenteritis 05/23/2011  . Dehydration 05/23/2011  . Blindness 05/23/2011  . DM type 1, not at goal, causing eye disease ([place]) 05/23/2011  . Asthma 05/23/2011  . Diarrhea 05/23/2011  . Vomiting 05/23/2011  . Hypothyroidism 05/23/2011  . Diabetic neuropathy ([place]) 05/23/2011  . Diabetic nephropathy ([place]) 05/23/2011    Voncille Lo, PT, [place] Certified Exercise Expert for the Aging Adult  04/14/20 12:47 PM Phone: [phone] Fax: [facility] [address] [place], [place], [address] Phone: [phone]   Fax:  [phone]  Name: Amy [name] MRN: [id number] Date of Birth: 14-Feb-1961

## 2020-04-15 DIAGNOSIS — M7021 Olecranon bursitis, right elbow: Secondary | ICD-10-CM | POA: Diagnosis not present

## 2020-04-15 DIAGNOSIS — M5000 Cervical disc disorder with myelopathy, unspecified cervical region: Secondary | ICD-10-CM | POA: Diagnosis not present

## 2020-04-15 DIAGNOSIS — I5032 Chronic diastolic (congestive) heart failure: Secondary | ICD-10-CM | POA: Diagnosis not present

## 2020-04-15 DIAGNOSIS — I10 Essential (primary) hypertension: Secondary | ICD-10-CM | POA: Diagnosis not present

## 2020-04-15 DIAGNOSIS — G4733 Obstructive sleep apnea (adult) (pediatric): Secondary | ICD-10-CM | POA: Diagnosis not present

## 2020-04-15 DIAGNOSIS — J45909 Unspecified asthma, uncomplicated: Secondary | ICD-10-CM | POA: Diagnosis not present

## 2020-04-15 DIAGNOSIS — E1122 Type 2 diabetes mellitus with diabetic chronic kidney disease: Secondary | ICD-10-CM | POA: Diagnosis not present

## 2020-04-15 DIAGNOSIS — H548 Legal blindness, as defined in USA: Secondary | ICD-10-CM | POA: Diagnosis not present

## 2020-04-15 DIAGNOSIS — N186 End stage renal disease: Secondary | ICD-10-CM | POA: Diagnosis not present

## 2020-04-15 DIAGNOSIS — K219 Gastro-esophageal reflux disease without esophagitis: Secondary | ICD-10-CM | POA: Diagnosis not present

## 2020-04-15 DIAGNOSIS — E785 Hyperlipidemia, unspecified: Secondary | ICD-10-CM | POA: Diagnosis not present

## 2020-04-17 ENCOUNTER — Telehealth: Payer: Self-pay

## 2020-04-17 ENCOUNTER — Telehealth: Payer: Self-pay | Admitting: Family Medicine

## 2020-04-17 NOTE — Telephone Encounter (Signed)
Amy [name] with [facility] called needing information on this patient.  She would like a nurse call her back.

## 2020-04-17 NOTE — Telephone Encounter (Signed)
Called and spoke with pt. I informed the pt that the [employer] transportation paper work she left with [place] at her last appt was missing her part. I informed the pt that the provider can't fill out his part of the paper work until he see's her part. During her last visit I made a copy and gave the original copy back to the pt's sister who was with the pt that day. Pt under stood and stated that they will get [place] the paper work once her portion is filled out    Also while I was on the phone with the pt she told me that her pharmacy told her she needs a PA for her Elenor Legato free style. I will send a message to Leroy Kennedy to see if we have gotten any paper work about this from her pharmacy.

## 2020-04-20 NOTE — Telephone Encounter (Signed)
Left a msg Lorraine machine to return call. We need to know what kind of information she needs for this patient?

## 2020-04-21 ENCOUNTER — Other Ambulatory Visit: Payer: Self-pay | Admitting: *Deleted

## 2020-04-21 ENCOUNTER — Ambulatory Visit: Payer: Medicare Other | Admitting: Physical Therapy

## 2020-04-21 NOTE — Patient Outreach (Signed)
[facility]) Care Management  [name]  04/21/2020   Amy [name] Mar 06, 1961 [id number]  Subjective: Successful telephone outreach call to patient. HIPAA identifiers obtained. Patient reports that she is not feeling well today. She explains that she has had cold symptoms for the last several days and her feet, especially at night are burning and painful. Patient did share that her right foot has a "blister" of which patient stated was a pressure wound. Patient does go to the podiatrist routinely. Patient reported that she is not currently taking her blood sugar. She explains that her PCP gave her prescription for a FreeStyle [place] of which [name] is having difficulty filing through her insurance. Patient did say that [employer] was going to contact her PCP regarding the issue. Patient states that her manual glucose meter is not working. Nurse discussed with the patient for her to contact her PCP today to inform them about having no means to check her blood sugar and also to let them know that she is not feeling well. Nurse voiced concerns about patient taking insulin and explained not being able to know how high or low her blood sugar is could be dangerous for the patient. Patient verbalized understanding and stated she would call her PCP today. Nurse ensured that the patient had her contact number and requested the patient to call her back if she felt that she would be unable to obtain a glucose meter. Patient confirmed she would call nurse back if needed. Patient also stated that she has not been taking her B/P or weighing herself because her B/P cuff and scales are not working. Nurse will send the patient a B/P cuff and scales through the mail. Nurse does not have a glucose meter to send patient as she needs a meter that talks to her because she is blind and will need a monitor that is audible.  Encounter Medications:  Outpatient Encounter Medications as of 04/21/2020   Medication Sig Note  . albuterol (PROVENTIL HFA;VENTOLIN HFA) 108 (90 BASE) MCG/ACT inhaler Inhale 2 puffs into the lungs every 4 (four) hours as needed for wheezing or shortness of breath. For short   . aspirin EC 81 MG tablet Take 81 mg by mouth daily.   . B-D UF III MINI PEN NEEDLES 31G X 5 MM MISC U 1 PEN NEEDLE QID WITH EACH INJECTION   . cetirizine (ZYRTEC) 10 MG tablet Take 10 mg by mouth daily.   . Continuous Blood Gluc Receiver (FREESTYLE LIBRE 2 READER) DEVI As instructed. iddm with complications - retinopathy, neuropathy, nephropathy. With device and sensors. (Patient not taking: Reported on 04/21/2020) 04/21/2020: Patient reports she is having difficulty getting this covered by her insurance. Nurse advise her to call her PCP for assistance.  . esomeprazole (NEXIUM) 40 MG capsule Take 40 mg by mouth daily.    Marland Kitchen HUMALOG KWIKPEN 100 UNIT/ML KiwkPen Inject 8-14 Units into the skin See admin instructions. Inject 8 units in morning, 12 units at lunch, and 14 units at dinner   . levothyroxine (SYNTHROID, LEVOTHROID) 150 MCG tablet Take 150 mcg by mouth daily before breakfast.   . Linaclotide (LINZESS) 145 MCG CAPS capsule Take 145 mcg by mouth daily as needed (constipation).    . naproxen (NAPROSYN) 500 MG tablet Take 500 mg by mouth 2 (two) times daily with a meal.   . ondansetron (ZOFRAN ODT) 4 MG disintegrating tablet Take 1 tablet (4 mg total) by mouth every 8 (eight) hours as needed for  nausea or vomiting. 11/23/2019: Needs refill  . potassium chloride SA (KLOR-CON) 20 MEQ tablet TAKE 1 TABLET BY MOUTH DAILY   . pregabalin (LYRICA) 150 MG capsule Take 150 mg by mouth 3 (three) times daily.    . simvastatin (ZOCOR) 20 MG tablet Take 20 mg by mouth every evening.    Marland Kitchen spironolactone (ALDACTONE) 25 MG tablet TAKE 1/2 TABLET BY MOUTH DAILY (Patient taking differently: Take 12.5 mg by mouth once. )   . torsemide (DEMADEX) 20 MG tablet TAKE 4 TABLETS (80 MG TOTAL) BY MOUTH DAILY.   Marland Kitchen TRESIBA  FLEXTOUCH 200 UNIT/ML SOPN Inject 60 Units into the skin at bedtime.  03/10/2020: Decreased to 54 units   No facility-administered encounter medications on file as of 04/21/2020.    Functional Status:  In your present state of health, do you have any difficulty performing the following activities: 10/02/2019 05/29/2019  Hearing? Y N  Comment has hearing aids -  Vision? 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 N N  Walking or climbing stairs? Y Y  Comment per patient due to pain in right leg and hips blind  Dressing or bathing? Y Y  Comment patient has aide daily to assist due to blindness and disabilities blind  Doing errands, shopping? Y Y  Comment yes, patient can cognitively go by herself but need transportation due to blindness. Patient's aide goes to the grocery store blind  Preparing Food and eating ? Y Y  Comment aide does cooking has aide  Using the Toilet? Y N  Comment patient has a hard time wiping herself due to obesity -  In the past six months, have you accidently leaked urine? Y N  Comment takes many diuretics -  Do you have problems with loss of bowel control? N N  Managing your Medications? [name]  Comment gets assistance to manage pill box managed by nurse  Managing your Finances? N N  Housekeeping or managing your Housekeeping? Y Y  Comment has aide to assist blind, has aide  Some recent data might be hidden    Fall/Depression Screening: Fall Risk  04/21/2020 04/13/2020 03/10/2020  Falls in the past year? 0 0 0  Number falls in past yr: 0 - 0  Injury with Fall? 0 - 0  Risk for fall due to : Impaired balance/gait;Impaired mobility - Impaired balance/gait;Impaired mobility;Impaired vision  Follow up Falls prevention discussed;Education provided;Falls evaluation completed Falls evaluation completed Falls prevention discussed;Education provided;Falls evaluation completed   PHQ 2/9 Scores 04/13/2020 10/01/2019  05/29/2019  PHQ - 2 Score 0 3 1  PHQ- 9 Score - 5 -    Assessment:  Goals Addressed            This Visit's Progress   . Monitor and Manage My Blood Sugar   Not on track    Timeframe:  Long-Range Goal Priority:  High Start Date:  03/10/20                           Expected End Date: 03/10/21                       - check blood sugar at prescribed times - check blood sugar if I feel it is too high or too low - enter blood sugar readings and medication or insulin into daily log - take the blood sugar meter to  all doctor visits    Why is this important?   Checking your blood sugar at home helps to keep it from getting very high or very low.  Writing the results in a diary or log helps the doctor know how to care for you.  Your blood sugar log should have the time, date and the results.  Also, write down the amount of insulin or other medicine that you take.  Other information, like what you ate, exercise done and how you were feeling, will also be helpful.     Notes: Patient states that she has not been checking her blood sugar. Patient explains she is having trouble getting the FreeStyle [place] prescription filled. She is working with [name] who patient reports is contacting her PCP. Patient states that her manual glucose monitor is not working. Nurse discussed with the patient for her to contact her PCP to inform them of the situation. Education was provided regarding the importance of her monitoring her blood sugar for her health especially given that the patient does take insulin. Patient did verbalize understanding and stated she would call her PCP to inform.   Updated: 04/21/20    . Perform Foot Care       Timeframe:  Long-Range Goal Priority:  High Start Date:  03/10/20                           Expected End Date: 03/10/21                        - check feet daily for cuts, sores or redness - keep feet up while sitting - trim toenails straight across - wash and dry feet  carefully every day - wear comfortable, cotton socks - wear comfortable, well-fitting shoes    Why is this important?   Good foot care is very important when you have diabetes.  There are many things you can do to keep your feet healthy and catch a problem early.    Notes: Patient sits with her feet elevated. She states her aide looks at her feet daily and she sees podiatrist [facility] routinely. Patient reports that she has a "blister" on her right foot.  Updated: 04/21/20       Plan:  RN Health Coach will send PCP a barrier letter and today's assessment note and will call patient within a month.  Follow-up:  Patient agrees to Care Plan and Follow-up.   [name] RN, BSN [place] [phone] [email or web address]

## 2020-04-21 NOTE — Patient Instructions (Addendum)
Goals Addressed            This Visit's Progress   . Monitor and Manage My Blood Sugar   Not on track    Timeframe:  Long-Range Goal Priority:  High Start Date:  03/10/20                           Expected End Date: 03/10/21                       - check blood sugar at prescribed times - check blood sugar if I feel it is too high or too low - enter blood sugar readings and medication or insulin into daily log - take the blood sugar meter to all doctor visits    Why is this important?   Checking your blood sugar at home helps to keep it from getting very high or very low.  Writing the results in a diary or log helps the doctor know how to care for you.  Your blood sugar log should have the time, date and the results.  Also, write down the amount of insulin or other medicine that you take.  Other information, like what you ate, exercise done and how you were feeling, will also be helpful.     Notes: Patient states that she has not been checking her blood sugar. Patient explains she is having trouble getting the FreeStyle [place] prescription filled. She is working with [name] who patient reports is contacting her PCP. Patient states that her manual glucose monitor is not working. Nurse discussed with the patient for her to contact her PCP to inform them of the situation. Education was provided regarding the importance of her monitoring her blood sugar for her health especially given that the patient does take insulin. Patient did verbalize understanding and stated she would call her PCP to inform.   Updated: 04/21/20    . Perform Foot Care       Timeframe:  Long-Range Goal Priority:  High Start Date:  03/10/20                           Expected End Date: 03/10/21                        - check feet daily for cuts, sores or redness - keep feet up while sitting - trim toenails straight across - wash and dry feet carefully every day - wear comfortable, cotton socks - wear  comfortable, well-fitting shoes    Why is this important?   Good foot care is very important when you have diabetes.  There are many things you can do to keep your feet healthy and catch a problem early.    Notes: Patient sits with her feet elevated. She states her aide looks at her feet daily and she sees podiatrist [facility] routinely. Patient reports that she has a "blister" on her right foot. Nurse discussed for the patient to monitor the site closely.   Updated: 04/21/20

## 2020-04-23 ENCOUNTER — Encounter (HOSPITAL_COMMUNITY): Payer: Self-pay

## 2020-04-23 ENCOUNTER — Emergency Department (HOSPITAL_COMMUNITY)
Admission: EM | Admit: 2020-04-23 | Discharge: 2020-04-23 | Disposition: A | Discharge status: Home / Self Care | Payer: Medicare Other | Attending: Emergency Medicine | Admitting: Emergency Medicine

## 2020-04-23 ENCOUNTER — Other Ambulatory Visit: Payer: Self-pay

## 2020-04-23 ENCOUNTER — Emergency Department (HOSPITAL_COMMUNITY): Payer: Medicare Other

## 2020-04-23 DIAGNOSIS — E1149 Type 2 diabetes mellitus with other diabetic neurological complication: Secondary | ICD-10-CM | POA: Insufficient documentation

## 2020-04-23 DIAGNOSIS — E1140 Type 2 diabetes mellitus with diabetic neuropathy, unspecified: Secondary | ICD-10-CM | POA: Insufficient documentation

## 2020-04-23 DIAGNOSIS — Z87891 Personal history of nicotine dependence: Secondary | ICD-10-CM | POA: Diagnosis not present

## 2020-04-23 DIAGNOSIS — Z794 Long term (current) use of insulin: Secondary | ICD-10-CM | POA: Insufficient documentation

## 2020-04-23 DIAGNOSIS — J45909 Unspecified asthma, uncomplicated: Secondary | ICD-10-CM | POA: Diagnosis not present

## 2020-04-23 DIAGNOSIS — R0682 Tachypnea, not elsewhere classified: Secondary | ICD-10-CM | POA: Insufficient documentation

## 2020-04-23 DIAGNOSIS — I130 Hypertensive heart and chronic kidney disease with heart failure and stage 1 through stage 4 chronic kidney disease, or unspecified chronic kidney disease: Secondary | ICD-10-CM | POA: Insufficient documentation

## 2020-04-23 DIAGNOSIS — R9082 White matter disease, unspecified: Secondary | ICD-10-CM | POA: Diagnosis not present

## 2020-04-23 DIAGNOSIS — Z79899 Other long term (current) drug therapy: Secondary | ICD-10-CM | POA: Diagnosis not present

## 2020-04-23 DIAGNOSIS — Z20822 Contact with and (suspected) exposure to covid-19: Secondary | ICD-10-CM | POA: Diagnosis not present

## 2020-04-23 DIAGNOSIS — E1165 Type 2 diabetes mellitus with hyperglycemia: Secondary | ICD-10-CM | POA: Diagnosis not present

## 2020-04-23 DIAGNOSIS — G8929 Other chronic pain: Secondary | ICD-10-CM | POA: Diagnosis not present

## 2020-04-23 DIAGNOSIS — R079 Chest pain, unspecified: Secondary | ICD-10-CM | POA: Insufficient documentation

## 2020-04-23 DIAGNOSIS — N183 Chronic kidney disease, stage 3 unspecified: Secondary | ICD-10-CM | POA: Insufficient documentation

## 2020-04-23 DIAGNOSIS — E1122 Type 2 diabetes mellitus with diabetic chronic kidney disease: Secondary | ICD-10-CM | POA: Diagnosis not present

## 2020-04-23 DIAGNOSIS — Z7982 Long term (current) use of aspirin: Secondary | ICD-10-CM | POA: Insufficient documentation

## 2020-04-23 DIAGNOSIS — I5032 Chronic diastolic (congestive) heart failure: Secondary | ICD-10-CM | POA: Diagnosis not present

## 2020-04-23 DIAGNOSIS — G629 Polyneuropathy, unspecified: Secondary | ICD-10-CM

## 2020-04-23 DIAGNOSIS — I739 Peripheral vascular disease, unspecified: Secondary | ICD-10-CM | POA: Diagnosis not present

## 2020-04-23 DIAGNOSIS — E039 Hypothyroidism, unspecified: Secondary | ICD-10-CM | POA: Diagnosis not present

## 2020-04-23 DIAGNOSIS — Z9889 Other specified postprocedural states: Secondary | ICD-10-CM | POA: Diagnosis not present

## 2020-04-23 DIAGNOSIS — R0789 Other chest pain: Secondary | ICD-10-CM | POA: Diagnosis not present

## 2020-04-23 DIAGNOSIS — G9009 Other idiopathic peripheral autonomic neuropathy: Secondary | ICD-10-CM | POA: Diagnosis not present

## 2020-04-23 DIAGNOSIS — R531 Weakness: Secondary | ICD-10-CM | POA: Diagnosis present

## 2020-04-23 LAB — CBC
HCT: 36.9 % (ref 36.0–46.0)
Hemoglobin: 11.5 g/dL — ABNORMAL LOW (ref 12.0–15.0)
MCH: 25.2 pg — ABNORMAL LOW (ref 26.0–34.0)
MCHC: 31.2 g/dL (ref 30.0–36.0)
MCV: 80.9 fL (ref 80.0–100.0)
Platelets: 213 10*3/uL (ref 150–400)
RBC: 4.56 MIL/uL (ref 3.87–5.11)
RDW: 16.5 % — ABNORMAL HIGH (ref 11.5–15.5)
WBC: 11.6 10*3/uL — ABNORMAL HIGH (ref 4.0–10.5)
nRBC: 0 % (ref 0.0–0.2)

## 2020-04-23 LAB — URINALYSIS, ROUTINE W REFLEX MICROSCOPIC
Bilirubin Urine: NEGATIVE
Glucose, UA: NEGATIVE mg/dL
Hgb urine dipstick: NEGATIVE
Ketones, ur: NEGATIVE mg/dL
Leukocytes,Ua: NEGATIVE
Nitrite: NEGATIVE
Protein, ur: 30 mg/dL — AB
Specific Gravity, Urine: 1.009 (ref 1.005–1.030)
pH: 5 (ref 5.0–8.0)

## 2020-04-23 LAB — RESP PANEL BY RT-PCR (FLU A&B, COVID) ARPGX2
Influenza A by PCR: NEGATIVE
Influenza B by PCR: NEGATIVE
SARS Coronavirus 2 by RT PCR: NEGATIVE

## 2020-04-23 LAB — BASIC METABOLIC PANEL
Anion gap: 11 (ref 5–15)
BUN: 32 mg/dL — ABNORMAL HIGH (ref 8–23)
CO2: 23 mmol/L (ref 22–32)
Calcium: 8.7 mg/dL — ABNORMAL LOW (ref 8.9–10.3)
Chloride: 105 mmol/L (ref 98–111)
Creatinine, Ser: 1.89 mg/dL — ABNORMAL HIGH (ref 0.44–1.00)
GFR, Estimated: 30 mL/min — ABNORMAL LOW (ref >60)
Glucose, Bld: 232 mg/dL — ABNORMAL HIGH (ref 70–99)
Potassium: 4.3 mmol/L (ref 3.5–5.1)
Sodium: 139 mmol/L (ref 135–145)

## 2020-04-23 LAB — MAGNESIUM: Magnesium: 2.2 mg/dL (ref 1.7–2.4)

## 2020-04-23 LAB — PHOSPHORUS: Phosphorus: 4.2 mg/dL (ref 2.5–4.6)

## 2020-04-23 LAB — BRAIN NATRIURETIC PEPTIDE: B Natriuretic Peptide: 70.5 pg/mL (ref 0.0–100.0)

## 2020-04-23 LAB — TROPONIN I (HIGH SENSITIVITY)
Troponin I (High Sensitivity): 8 ng/L (ref <18)
Troponin I (High Sensitivity): 5 ng/L (ref <18)

## 2020-04-23 MED ORDER — GABAPENTIN 300 MG PO CAPS
300.0000 mg | ORAL_CAPSULE | Freq: Once | ORAL | Status: AC
  Administered 2020-04-23: 300 mg via ORAL
  Filled 2020-04-23: qty 1

## 2020-04-23 MED ORDER — GABAPENTIN 300 MG PO CAPS: 300.0000 mg | ORAL_CAPSULE | Freq: Every day | ORAL | 0 refills | Status: DC

## 2020-04-23 NOTE — ED Triage Notes (Signed)
Patient states she normally has tingling in her feet, but last night "I had tingling all the way to my chest. I thought I was dying." Patient states when she woke today, she was weak and could hardly get out of the bed.

## 2020-04-23 NOTE — ED Provider Notes (Signed)
[facility] DEPT Provider Note   CSN: [id number] Arrival date & time: 04/23/20  8299     History Chief Complaint  Patient presents with  . Weakness  . Chest Pain    Amy [name] is a 59 y.o. female.  Pt presents to the ED today with weakness and cp.  Pt said she has diabetes and normally has neuropathy in her feet.  She said the tingling all over her body last night.  She said she felt like she was dying.  She was very weak this morning.  Pt did have cold sx last week, but those are gone now.  She has been Covid vaccinated + booster.        Past Medical History:  Diagnosis Date  . Arthritis   . Asthma   . Blind   . Blindness and low vision    left eye glass eye,  legally blind in right eye  . Diabetes mellitus   . Diabetic neuropathy ([place])   . Hyperlipidemia   . Hypertension   . Hypothyroidism   . Spinal stenosis     Patient Active Problem List   Diagnosis Date Noted  . 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 Pre-operative cardiovascular examination 02/18/2019  . Septic bursitis of elbow, right 10/30/2018  . CRI (chronic renal insufficiency), stage 3 (moderate) 07/26/2018  . Morbid obesity ([place]) 07/26/2018  . Intractable nausea and vomiting 03/06/2018  . Right leg pain 12/19/2017  . Leukocytosis 12/19/2017  . Benign positional vertigo 12/19/2017  . Weakness 12/12/2017  . Laryngopharyngeal reflux (LPR) 10/19/2017  . Post-nasal drainage 10/19/2017  . Acute sinusitis 10/19/2017  . Degeneration of lumbar intervertebral disc 07/29/2017  . 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 Obstructive sleep apnea on CPAP 02/09/2017  . Chronic diastolic heart failure ([place]) 11/08/2016  . Near syncope 01/30/2016  . SOB  (shortness of breath)   . CAP (community acquired pneumonia) 09/12/2015  . Hypoxia 09/12/2015  . Essential hypertension 07/05/2015  . Hypotension 07/05/2015  . Diabetes mellitus with neurological manifestations ([place]) 11/04/2014  . Hyperlipidemia LDL goal <70 11/04/2014  . Spinal stenosis, multilevel 11/04/2014  . Hyperkalemia 11/01/2014  . Hypoglycemia 08/09/2014  . Type 2 diabetes mellitus with complication, with long-term current use of insulin ([place]) 08/08/2014  . Elevated troponin 08/08/2014  . Nausea vomiting and diarrhea 08/08/2014  . 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 Dizziness 08/06/2012  . Orthostatic hypotension 08/06/2012  . Hypernatremia 08/06/2012  . Acute gastroenteritis 08/13/2011  . Gastroparesis 08/13/2011  . Low back pain 08/13/2011  . Oral thrush 08/13/2011  . Gastroenteritis 05/23/2011  . Dehydration 05/23/2011  . Blindness 05/23/2011  . DM type 1, not at goal, causing eye disease ([place]) 05/23/2011  . Asthma 05/23/2011  . Diarrhea 05/23/2011  . Vomiting 05/23/2011  . Hypothyroidism 05/23/2011  . Diabetic neuropathy ([place]) 05/23/2011  . Diabetic nephropathy ([place]) 05/23/2011    Past Surgical History:  Procedure Laterality Date  . ABDOMINAL HYSTERECTOMY  1990  . [place]   x 2  . CHOLECYSTECTOMY  2008  . ENUCLEATION Bilateral 09/15/1998  . EYE SURGERY  2016   fitting artificial eye     OB History   No obstetric history on file.    Obstetric  Comments  Pt has two sons.        Family History  Problem Relation Age of Onset  . Diabetes Sister   . Glaucoma Sister   . Hypertension Sister   . Healthy Mother   . Healthy Father   . Stroke Brother   . Heart attack Paternal Aunt   . Breast cancer Neg Hx     Social History   Tobacco Use  . Smoking status: Former Smoker    Types: Cigarettes    Quit date: 05/22/1992    Years since quitting: 27.9  . Smokeless tobacco: Never Used   Vaping Use  . Vaping Use: Never used  Substance Use Topics  . Alcohol use: No    Comment: quit 20 yrs ago  . Drug use: No    Home Medications Prior to Admission medications   Medication Sig Start Date End Date Taking? Authorizing Provider  albuterol (PROVENTIL HFA;VENTOLIN HFA) 108 (90 BASE) MCG/ACT inhaler Inhale 2 puffs into the lungs every 4 (four) hours as needed for wheezing or shortness of breath. For short    [provider]  aspirin EC 81 MG tablet Take 81 mg by mouth daily.    [provider]  B-D UF III MINI PEN NEEDLES 31G X 5 MM MISC U 1 PEN NEEDLE QID WITH EACH INJECTION 08/01/18   [provider]  cetirizine (ZYRTEC) 10 MG tablet Take 10 mg by mouth daily.    [provider]  Continuous Blood Gluc Receiver (FREESTYLE LIBRE 2 READER) DEVI As instructed. iddm with complications - retinopathy, neuropathy, nephropathy. With device and sensors. Patient not taking: Reported on 04/21/2020 04/13/20   Wendie Agreste, MD  esomeprazole (NEXIUM) 40 MG capsule Take 40 mg by mouth daily.     [provider]  gabapentin (NEURONTIN) 300 MG capsule Take 1 capsule (300 mg total) by mouth at bedtime. 04/23/20   Isla Pence, MD  HUMALOG KWIKPEN 100 UNIT/ML KiwkPen Inject 8-14 Units into the skin See admin instructions. Inject 8 units in morning, 12 units at lunch, and 14 units at dinner 01/24/17   [provider]  levothyroxine (SYNTHROID, LEVOTHROID) 150 MCG tablet Take 150 mcg by mouth daily before breakfast.    [provider]  Linaclotide (LINZESS) 145 MCG CAPS capsule Take 145 mcg by mouth daily as needed (constipation).     [provider]  naproxen (NAPROSYN) 500 MG tablet Take 500 mg by mouth 2 (two) times daily with a meal.    [provider]  ondansetron (ZOFRAN ODT) 4 MG disintegrating tablet Take 1 tablet (4 mg total) by mouth every 8 (eight) hours as needed for nausea or vomiting. 07/09/19   Antonietta Breach, PA-C  potassium chloride SA (KLOR-CON) 20 MEQ tablet TAKE 1 TABLET BY MOUTH DAILY 02/07/20   Bensimhon, Shaune Pascal, MD  pregabalin (LYRICA) 150 MG capsule Take 150 mg by mouth 3 (three) times daily.  01/01/18   [provider]  simvastatin (ZOCOR) 20 MG tablet Take 20 mg by mouth every evening.     [provider]  spironolactone (ALDACTONE) 25 MG tablet TAKE 1/2 TABLET BY MOUTH DAILY Patient taking differently: Take 12.5 mg by mouth once.  01/30/19   Bensimhon, Shaune Pascal, MD  torsemide (DEMADEX) 20 MG tablet TAKE 4 TABLETS (80 MG TOTAL) BY MOUTH DAILY. 02/07/20   Bensimhon, Shaune Pascal, MD  TRESIBA FLEXTOUCH 200 UNIT/ML SOPN Inject 60 Units into the skin at bedtime.  05/30/18   [provider]    Allergies    Penicillins, Tramadol, Vicodin [hydrocodone-acetaminophen], Codeine, Iodine-131, Iohexol, Lisinopril, and Sulfa antibiotics  Review of Systems   Review of Systems  Neurological: Positive for weakness.  All other systems reviewed and are negative.   Physical Exam Updated Vital Signs BP (!) 149/71   Pulse 67   Temp 98 F (36.7 C) (Oral)   Resp 16   Ht 4\' 11"  (1.499 m)   Wt 108.9 kg   SpO2 100%   BMI 48.47 kg/m   Physical Exam Vitals and nursing note reviewed.  Constitutional:      Appearance: She is well-developed. She is obese.  HENT:     Head: Normocephalic and atraumatic.  Eyes:     Comments: Pt is blind (chronically)  Cardiovascular:     Rate and Rhythm: Normal rate and regular rhythm.     Heart sounds: Normal heart sounds.  Pulmonary:     Effort: Tachypnea present.     Breath sounds: Normal breath sounds.  Abdominal:     General: Bowel sounds are normal.     Palpations: Abdomen is soft.  Musculoskeletal:        General: Normal range of motion.     Cervical back: Normal range of motion and neck supple.  Skin:    General: Skin is warm.     Capillary Refill: Capillary refill takes less than 2 seconds.  Neurological:     General: No  focal deficit present.     Mental Status: She is alert and oriented to person, place, and time.     ED Results / Procedures / Treatments   Labs (all labs ordered are listed, but only abnormal results are displayed) Labs Reviewed  BASIC METABOLIC PANEL - Abnormal; Notable for the following components:      Result Value   Glucose, Bld 232 (*)    BUN 32 (*)    Creatinine, Ser 1.89 (*)    Calcium 8.7 (*)    GFR, Estimated 30 (*)    All other components within normal limits  CBC - Abnormal; Notable for the following components:   WBC 11.6 (*)    Hemoglobin 11.5 (*)    MCH 25.2 (*)    RDW 16.5 (*)    All other components within normal limits  URINALYSIS, ROUTINE W REFLEX MICROSCOPIC - Abnormal; Notable for the following components:   Protein, ur 30 (*)    Bacteria, UA MANY (*)    All other components within normal limits  RESP PANEL BY RT-PCR (FLU A&B, COVID) ARPGX2  BRAIN NATRIURETIC PEPTIDE  MAGNESIUM  PHOSPHORUS  TROPONIN I (HIGH SENSITIVITY)  TROPONIN I (HIGH SENSITIVITY)    EKG EKG Interpretation  Date/Time:  Thursday April 23 2020 12:10:07 EST Ventricular Rate:  66 PR Interval:    QRS Duration: 83 QT Interval:  436 QTC Calculation: 457 R Axis:   41 Text Interpretation: Sinus rhythm Low voltage, precordial leads No significant change since last tracing Confirmed by Isla Pence [phone]) on 04/23/2020 2:28:09 PM   Radiology DG Chest 2 View  Result Date: 04/23/2020 CLINICAL DATA:  Chest pain. EXAM: CHEST - 2 VIEW COMPARISON:  01/08/2019. FINDINGS: Mild enlargement of the cardiac silhouette. No consolidation. New possible 8 mm pulmonary nodule in the lateral right lower lung. No visible pleural effusions or pneumothorax. No acute osseous abnormality. Cholecystectomy clips. IMPRESSION: 1. New possible 8 mm pulmonary nodule in the lateral right lower lung. Recommend CT chest to further evaluate. 2. Otherwise, no  acute cardiopulmonary disease. Electronically Signed    By: [name] MD   On: 04/23/2020 11:47   CT Head Wo Contrast  Result Date: 04/23/2020 CLINICAL DATA:  Mental status changes EXAM: CT HEAD WITHOUT CONTRAST TECHNIQUE: Contiguous axial images were obtained from the base of the skull through the vertex without intravenous contrast. COMPARISON:  08/06/2012 FINDINGS: Brain: Chronic small vessel disease throughout the deep white matter. No acute intracranial abnormality. Specifically, no hemorrhage, hydrocephalus, mass lesion, acute infarction, or significant intracranial injury. Vascular: No hyperdense vessel or unexpected calcification. Skull: No acute calvarial abnormality. Sinuses/Orbits: No acute findings. Postoperative changes in the orbits, unchanged. Other: None IMPRESSION: Chronic small vessel disease throughout the deep white matter. No acute intracranial abnormality. Electronically Signed   By: Rolm Baptise M.D.   On: 04/23/2020 14:48    Procedures Procedures (including critical care time)  Medications Ordered in ED Medications  gabapentin (NEURONTIN) capsule 300 mg (has no administration in time range)    ED Course  I have reviewed the triage vital signs and the nursing notes.  Pertinent labs & imaging results that were available during my care of the patient were reviewed by me and considered in my medical decision making (see chart for details).    MDM Rules/Calculators/A&P                          Pt is feeling better.  Labs show chronic hyperglycemia and ckd.  Pt will be started on neurontin for neuropathy.  Pt is stable for d/c.  Return if worse.  Final Clinical Impression(s) / ED Diagnoses Final diagnoses:  Other diabetic neurological complication associated with type 2 diabetes mellitus ([place])    Rx / DC Orders ED Discharge Orders         Ordered    gabapentin (NEURONTIN) 300 MG capsule  Daily at bedtime        04/23/20 1521           Isla Pence, MD 04/23/20 1524

## 2020-04-24 ENCOUNTER — Other Ambulatory Visit: Payer: Self-pay | Admitting: *Deleted

## 2020-04-24 NOTE — Patient Outreach (Signed)
[facility]) Care Management  04/24/2020  Amy [name] 1960/07/11 [id number]  Successful telephone outreach call to patient. HIPAA identifiers obtained. Nurse called patient to follow-up with her after her discharge from the ED on 04/23/20. Patient explained that she felt intense, painful tingling all over body during the night and reported "I felt like I was dying".  Patient added that she felt extremely weak the next morning and decided to go to the ED to be checked out. Patient was thoroughly checked out and was discharged with a prescription of gabapentin which has been effective with helping her neuropathy pain. Patient stated that she was able to locate her supplies for her audible glucometer and take her blood sugar before she left to go to the ED. Patient explains that this is extremely difficult for her to do due to her being blind and she shared she had to stick her finger several times to get the value of 176.  Patient's PCP wrote a prescription for a FreeStyle Libre of which [facility] states her insurance did not improve the device and needs more information from her PCP to process the prescription. Nurse encouraged the patient to call her insurance company to find out specifically what information is needed and for her to contact her PCP to notify them that she is having difficulty. Nurse will place a pharmacy referral to see if they are able to offer any assistance.Patient verbalized understanding. Nurse suggested that the patient check her blood sugar when her aide was present during the day so that they could be guide her while she was placing her strip in the device and applying the blood to the strip; knowing that the aide is not authorized to perform this task but could assist with their eyes. Patient did not have any further questions or concerns today and did confirm that she has this nurse's contact number to call her if she needed assistance with contacting her PCP.     Plan: RN Health Coach will call patient within the month of January.  [name] RN, BSN [place] [phone] [name]

## 2020-04-27 NOTE — Patient Outreach (Signed)
[facility]) Care Management  04/27/2020  Amy [name] 1960-10-03 [id number]  Referral from [place], RN for medication assistance. Request sent to [place].  Ina Homes [facility] Management Assistant [phone]

## 2020-04-27 NOTE — Progress Notes (Signed)
This encounter was created in error - please disregard.

## 2020-04-28 ENCOUNTER — Encounter: Payer: Self-pay | Admitting: Physical Therapy

## 2020-04-28 ENCOUNTER — Other Ambulatory Visit: Payer: Self-pay

## 2020-04-28 ENCOUNTER — Ambulatory Visit: Payer: Medicare Other | Attending: Neurosurgery | Admitting: Physical Therapy

## 2020-04-28 DIAGNOSIS — R262 Difficulty in walking, not elsewhere classified: Secondary | ICD-10-CM | POA: Insufficient documentation

## 2020-04-28 DIAGNOSIS — G8929 Other chronic pain: Secondary | ICD-10-CM | POA: Diagnosis not present

## 2020-04-28 DIAGNOSIS — M5441 Lumbago with sciatica, right side: Secondary | ICD-10-CM | POA: Diagnosis not present

## 2020-04-28 DIAGNOSIS — M6281 Muscle weakness (generalized): Secondary | ICD-10-CM | POA: Insufficient documentation

## 2020-04-28 DIAGNOSIS — M25652 Stiffness of left hip, not elsewhere classified: Secondary | ICD-10-CM | POA: Insufficient documentation

## 2020-04-28 NOTE — Patient Instructions (Addendum)
WALKING  Amy [name] needs to walk every day.  And get up out of chair at least every 2 hours.  Walking is a great form of exercise to increase your strength, endurance and overall fitness.  A walking program can help you start slowly and gradually build endurance as you go.  Everyone's ability is different, so each person's starting point will be different.  You do not have to follow them exactly.  The are just samples. You should simply find out what's right for you and stick to that program.   In the beginning, you'll start off walking 2-3 times a day for short distances.  As you get stronger, you'll be walking further at just 1-2 times per day. Pt needs to exercises at least 150 min a week or 30 min 5 days aweek.  A. You Can Walk For A Certain Length Of Time Each Day  This is where Amy [name] can start  Amy [name] needs to get up and walk every 2 hours except when sleeping    Walk 5 minutes 3 times per day.  Increase 2 minutes every 2 days (3 times per day).  Work up to 25-30 minutes (1-2 times per day).   Example:   Day 1-2 5 minutes 3 times per day at least   Day 7-8 12 minutes 2-3 times per day   Day 13-14 25 minutes 1-2 times per day  B. You Can Walk For a Certain Distance Each Day     Distance can be substituted for time.    Example:   3 trips to mailbox (at road)   3 trips to corner of block   3 trips around the block  C. Go to local [employer] and use the track.    Walk for distance ____ around track  Or time ____ minutes  D. Walk _x___ Jog ____ Run ___  Please only do the exercises that your therapist has initialed and dated       Amy [name], PT, [facility] Certified Exercise Expert for the Aging Adult  04/28/20 12:36 PM Phone: [phone] Fax: [phone]

## 2020-04-28 NOTE — Therapy (Addendum)
[place], [place], [address] Phone: [phone]   Fax:  [phone]  Physical Therapy Treatment/Discharge Note  Patient Details  Name: Amy [name] MRN: [id number] Date of Birth: 07-23-1960 Referring Provider (PT): Leonie Green, NP   Encounter Date: 04/28/2020   PT End of Session - 04/28/20 1159    Visit Number 7    Number of Visits 13    Date for PT Re-Evaluation 05/19/20    Authorization Type U[employer] MCR/MCD, recheck FOTO status by visit 6, progress note by visit 10    PT Start Time 1150    PT Stop Time 1240    PT Time Calculation (min) 50 min    Activity Tolerance Patient tolerated treatment well    Behavior During Therapy [facility] for tasks assessed/performed           Past Medical History:  Diagnosis Date  . Arthritis   . Asthma   . Blind   . Blindness and low vision    left eye glass eye,  legally blind in right eye  . Diabetes mellitus   . Diabetic neuropathy ([place])   . Hyperlipidemia   . Hypertension   . Hypothyroidism   . Spinal stenosis     Past Surgical History:  Procedure Laterality Date  . ABDOMINAL HYSTERECTOMY  1990  . [place]   x 2  . CHOLECYSTECTOMY  2008  . ENUCLEATION Bilateral 09/15/1998  . EYE SURGERY  2016   fitting artificial eye    There were no vitals filed for this visit.   Subjective Assessment - 04/28/20 1214    Subjective Pt went to the ED for tingling and she thought she was dying on 04-23-20.  Pt was checked out and thought to have electrolyte imbalance but was then sent home.  Pt did not have aide with her  and was expecting Tia to come but she was not present for RX. Pt was given Gabapentin and feels like it is helping.    Pertinent History visually impaired/blind, CHF, diabetic, OSA on CPAP, spinal stenosis    Currently in Pain? No/denies    Pain Score 0-No pain    Pain Location Back                              [facility] Adult PT Treatment/Exercise - 04/28/20 0001      Transfers   Supine to Sit 4: Min assist    Supine to Sit Details Tactile cues for initiation;Tactile cues for weight shifting    Sit to Supine 4: Min assist    Sit to Supine Details (indicate cue type and reason) Tactile cues for weight shifting;Tactile cues for weight beaing      Self-Care   Self-Care Other Self-Care Comments    Other Self-Care Comments  walking program explained to pt and aide      Lumbar Exercises: Stretches   Lower Trunk Rotation 5 reps;20 seconds    Lower Trunk Rotation Limitations PT assist stretch    Piriformis Stretch 3 reps;30 seconds    Piriformis Stretch Limitations with towel asssist in supine aide assiting      Lumbar Exercises: Standing   Other Standing Lumbar Exercises sit to stand wit chair in front 10 x 2    Other Standing Lumbar Exercises 33mn walk test with 513 feet and 2 rests      Lumbar Exercises: Supine  Pelvic Tilt 10 reps    Pelvic Tilt Limitations x2 VC and TC  needs extra time for correct execution and mod cuing    Clam 20 reps    Clam Limitations green t band    Bent Knee Raise 15 reps    Bent Knee Raise Limitations  R and L    Straight Leg Raise 10 reps    Straight Leg Raises Limitations R and L extra time needed      Knee/Hip Exercises: Seated   Other Seated Knee/Hip Exercises green t band resisted knee ext and flexion 15 each R and L                    PT Short Term Goals - 04/28/20 1216      PT SHORT TERM GOAL #1   Title Pt will be consistent about walking/ standing  for 15 to 20 minutes every day with aide    Baseline Pt able to walk 513 ft over 6 min with rest.. .limited by endurance  no pain    Time 3    Period Weeks    Status On-going    Target Date 04/14/20      PT SHORT TERM GOAL #2   Title Pt will be consisistent with HEP every day especially sink squats 3 x a day for movement snacks    Baseline Pt not consistent with exercises went over with aide  to encourage consistency    Time 3    Period Weeks    Status On-going    Target Date 04/14/20      PT SHORT TERM GOAL #3   Title Pt will demonstrate 5 x sts with improvement in lower extremity strength at 40 seconds or less    Time 3    Period Weeks    Status Unable to assess             PT Long Term Goals - 04/28/20 1303      PT LONG TERM GOAL #1   Title Independent with HEP    Baseline Pt working on HEP with aide for home use and pain control    Time 8    Period Weeks    Status On-going      PT LONG TERM GOAL #2   Title Improve FOTO outcome measure score to 51% from 24%    Baseline intake  40   limited  60% improving    Time 8    Period Weeks    Status On-going      PT LONG TERM GOAL #3   Title Tolerate right sidelying position for sleep with sleep disturbance symptoms/pain decreased 50% or greater from current status    Baseline prefers to lie on back but better able to com supine to sit with min gaurd    Time 8    Period Weeks    Status On-going      PT LONG TERM GOAL #4   Title Tolerate sitting at least 30 min for use of transportation and sitting to eat meals with pain <5/10    Baseline Pt was able to sit in foyer for 1 hour  , Pt with no complaint of pain    Time 8    Period Weeks    Status On-going      PT LONG TERM GOAL #5   Title Increase trunk flexion AROM at least 20-30 deg to improve ability for dressing, donning shoes    Baseline  Pt with difficulty moving legs,  deconditioned and does not carry over at home consistently, needs aides    Time 8    Period Weeks    Status On-going         Reviewed HEP with aide  Access Code: [email or web address]: [email or web address]: 12/14/2021Prepared by: Donnetta Simpers BeardsleyExercises  Supine Posterior Pelvic Tilt - 1 x daily - 7 x weekly - 2 sets - 10 reps - 3-5 seconds hold  Lower Trunk Rotations - 1-2 x daily - 7 x weekly - 1-2 sets - 10 reps - 5-10 seconds hold  Supine Active Straight Leg Raise - 1 x  daily - 7 x weekly - 2 sets - 10 reps  Supine Piriformis Stretch - 1 x daily - 7 x weekly - 3 sets - 10 reps  Hooklying Clamshell with Resistance - 1 x daily - 7 x weekly - 3 sets - 10 reps  Sit to Stand with Arm Reach Toward Target - 1 x daily - 7 x weekly - 3 sets - 10 reps  Standing March with Counter Support - 1 x daily - 7 x weekly - 3 sets - 10 reps  Seated Knee Extension with Resistance - 1 x daily - 7 x weekly - 2 sets - 10 reps  Seated Knee Flexion with Anchored Resistance - 1 x daily - 7 x weekly - 2 sets - 10 reps  Hooklying Clamshell with Resistance - 1 x daily - 7 x weekly - 2 sets - 10 reps        Plan - 04/28/20 1219    Clinical Impression Statement Pt with 0/10 pain today , Went ot ED on 04-23-20 for neuropathy and woke up with tingling in legs and "felt like she was dying" Pt with electrolyte imbalance and was given gabapentin for nerve pain.  Today  pt with no complaint of pain but did develop some tingling with SLR but was shown stretching to alleviate with help of aide who came and returned demo. Pt /aide explained importance of moving throughout the day and told to get up out of chair every 2 hours and to do exercises daily including walking program  Pt/ aide admits to not doing much of exercises at home and sits most of the time.  Pt /aide would benefit from reinforcing HEP and then DC.  All were in aggreement. Pt was able to control pain with movement and recent addition of gabapentin according to pt.  Will continue POC    Personal Factors and Comorbidities Comorbidity 3+    Comorbidities blindness, balance issues, diabetic, CHF    Examination-Activity Limitations Bend;Lift;Squat;Sleep;Dressing;Bathing;Sit;Hygiene/Grooming;Bed Mobility;Locomotion Level;Carry;Stand    Examination-Participation Restrictions Shop;Community Activity;Cleaning;Meal Prep    Stability/Clinical Decision Making Evolving/Moderate complexity    PT Treatment/Interventions ADLs/Self Care Home  Management;Cryotherapy;Electrical Stimulation;Moist Heat;Functional mobility training;Neuromuscular re-education;Therapeutic exercise;Patient/family education;Manual techniques;Dry needling;Therapeutic activities;Gait training    PT Next Visit Plan Maybe DC  FOTO, GOALS and reinforce HEP    PT Home Exercise Plan [email or web address]    Consulted and Agree with Plan of Care Patient;Other (Comment)           Patient will benefit from skilled therapeutic intervention in order to improve the following deficits and impairments:  Pain,Impaired flexibility,Decreased strength,Decreased activity tolerance,Decreased range of motion,Decreased balance,Difficulty walking  Visit Diagnosis: Chronic right-sided low back pain with right-sided sciatica  Muscle weakness (generalized)  Difficulty in walking, not elsewhere classified  Stiffness of left hip, not elsewhere classified     Problem List Patient Active Problem  List   Diagnosis Date Noted  . 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 Pre-operative cardiovascular examination 02/18/2019  . Septic bursitis of elbow, right 10/30/2018  . CRI (chronic renal insufficiency), stage 3 (moderate) 07/26/2018  . Morbid obesity ([place]) 07/26/2018  . Intractable nausea and vomiting 03/06/2018  . Right leg pain 12/19/2017  . Leukocytosis 12/19/2017  . Benign positional vertigo 12/19/2017  . Weakness 12/12/2017  . Laryngopharyngeal reflux (LPR) 10/19/2017  . Post-nasal drainage 10/19/2017  . Acute sinusitis 10/19/2017  . Degeneration of lumbar intervertebral disc 07/29/2017  . 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 Obstructive sleep apnea on CPAP 02/09/2017  . Chronic diastolic heart failure ([place]) 11/08/2016  . Near syncope 01/30/2016  . SOB  (shortness of breath)   . CAP (community acquired pneumonia) 09/12/2015  . Hypoxia 09/12/2015  . Essential hypertension 07/05/2015  . Hypotension 07/05/2015  . Diabetes mellitus with neurological manifestations ([place]) 11/04/2014  . Hyperlipidemia LDL goal <70 11/04/2014  . Spinal stenosis, multilevel 11/04/2014  . Hyperkalemia 11/01/2014  . Hypoglycemia 08/09/2014  . Type 2 diabetes mellitus with complication, with long-term current use of insulin ([place]) 08/08/2014  . Elevated troponin 08/08/2014  . Nausea vomiting and diarrhea 08/08/2014  . 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 Dizziness 08/06/2012  . Orthostatic hypotension 08/06/2012  . Hypernatremia 08/06/2012  . Acute gastroenteritis 08/13/2011  . Gastroparesis 08/13/2011  . Low back pain 08/13/2011  . Oral thrush 08/13/2011  . Gastroenteritis 05/23/2011  . Dehydration 05/23/2011  . Blindness 05/23/2011  . DM type 1, not at goal, causing eye disease ([place]) 05/23/2011  . Asthma 05/23/2011  . Diarrhea 05/23/2011  . Vomiting 05/23/2011  . Hypothyroidism 05/23/2011  . Diabetic neuropathy ([place]) 05/23/2011  . Diabetic nephropathy ([place]) 05/23/2011    Voncille Lo, PT, [place] Certified Exercise Expert for the Aging Adult  04/28/20 1:19 PM Phone: [phone] Fax: [facility] [address] [place], [place], [address] Phone: [phone]   Fax:  [phone]  Name: Amy [name] MRN: [id number] Date of Birth: Oct 08, 1960  PHYSICAL THERAPY DISCHARGE SUMMARY  Visits from Start of Care: 7  Current functional level related to goals / functional outcomes: unknonwn   Remaining deficits: unknown   Education / Equipment: HEP Plan:                                                    Patient goals were partially met. Patient is being discharged due to not returning since the last visit.  ?????    Voncille Lo, PT, [facility] Certified Exercise Expert for the Aging Adult  06/23/20 2:59 PM Phone: [phone] Fax: [phone]

## 2020-04-29 ENCOUNTER — Telehealth: Payer: Self-pay | Admitting: Family Medicine

## 2020-04-29 ENCOUNTER — Ambulatory Visit
Admission: RE | Admit: 2020-04-29 | Discharge: 2020-04-29 | Disposition: A | Discharge status: Home / Self Care | Payer: Medicare Other | Source: Ambulatory Visit | Attending: Family Medicine | Admitting: Family Medicine

## 2020-04-29 DIAGNOSIS — Z1231 Encounter for screening mammogram for malignant neoplasm of breast: Secondary | ICD-10-CM

## 2020-04-29 NOTE — Telephone Encounter (Signed)
Patient dropped off part B paper work for [place] to be sighed and completed by provider / Placed paperwork in [facility] Provider lounge

## 2020-04-29 NOTE — Telephone Encounter (Signed)
Paperwork in provider area.

## 2020-04-30 ENCOUNTER — Other Ambulatory Visit (HOSPITAL_COMMUNITY): Payer: Self-pay | Admitting: Internal Medicine

## 2020-04-30 ENCOUNTER — Other Ambulatory Visit (HOSPITAL_COMMUNITY): Payer: Self-pay | Admitting: Cardiology

## 2020-04-30 DIAGNOSIS — E1122 Type 2 diabetes mellitus with diabetic chronic kidney disease: Secondary | ICD-10-CM | POA: Diagnosis not present

## 2020-04-30 DIAGNOSIS — N186 End stage renal disease: Secondary | ICD-10-CM | POA: Diagnosis not present

## 2020-04-30 DIAGNOSIS — I10 Essential (primary) hypertension: Secondary | ICD-10-CM | POA: Diagnosis not present

## 2020-04-30 DIAGNOSIS — J45909 Unspecified asthma, uncomplicated: Secondary | ICD-10-CM | POA: Diagnosis not present

## 2020-04-30 DIAGNOSIS — H548 Legal blindness, as defined in USA: Secondary | ICD-10-CM | POA: Diagnosis not present

## 2020-04-30 DIAGNOSIS — G4733 Obstructive sleep apnea (adult) (pediatric): Secondary | ICD-10-CM | POA: Diagnosis not present

## 2020-04-30 DIAGNOSIS — I5032 Chronic diastolic (congestive) heart failure: Secondary | ICD-10-CM | POA: Diagnosis not present

## 2020-04-30 DIAGNOSIS — E785 Hyperlipidemia, unspecified: Secondary | ICD-10-CM | POA: Diagnosis not present

## 2020-04-30 DIAGNOSIS — K219 Gastro-esophageal reflux disease without esophagitis: Secondary | ICD-10-CM | POA: Diagnosis not present

## 2020-04-30 DIAGNOSIS — M5000 Cervical disc disorder with myelopathy, unspecified cervical region: Secondary | ICD-10-CM | POA: Diagnosis not present

## 2020-04-30 DIAGNOSIS — M7021 Olecranon bursitis, right elbow: Secondary | ICD-10-CM | POA: Diagnosis not present

## 2020-05-05 ENCOUNTER — Other Ambulatory Visit: Payer: Self-pay | Admitting: Family Medicine

## 2020-05-05 ENCOUNTER — Ambulatory Visit: Payer: Medicare Other | Admitting: Physical Therapy

## 2020-05-05 MED ORDER — DEXCOM G5 RECEIVER KIT DEVI: 0 refills | Status: DC

## 2020-05-05 MED ORDER — TRESIBA FLEXTOUCH 200 UNIT/ML ~~LOC~~ SOPN
60.0000 [IU] | PEN_INJECTOR | Freq: Every day | SUBCUTANEOUS | 0 refills | Status: DC
Start: 2020-05-05 — End: 2020-05-06

## 2020-05-05 NOTE — Telephone Encounter (Signed)
What is the name of the medication? Dexcom sensor   Have you contacted your pharmacy to request a refill? Pt got a Libre 2, but her insurance company will not pay for this. She would instead prefer a Dexcom sensor her insurance will pay for this.   Which pharmacy would you like this sent to? [facility] on [address]   Patient notified that their request is being sent to the clinical staff for review and that they should receive a call once it is complete. If they do not receive a call within 72 hours they can check with their pharmacy or our office.

## 2020-05-05 NOTE — Telephone Encounter (Signed)
TRESIBA FLEXTOUCH 200 UNIT/ML SOPN   05/30/2018    Sig - Route: Inject 60 Units into the skin at bedtime. - Subcutaneous   Class: Historical Med    [facility] - [place], [place] - [address] Phone:  [phone]  Fax:  [phone]     Pharmacy states all scripts have been transferred to [facility] except this one last seen on 04/13/20   Please send new script over

## 2020-05-05 NOTE — Telephone Encounter (Signed)
  Notes to clinic:  Patient states that script needs to be transferred to Dr. Carlota Raspberry to fill Review for refill    Requested Prescriptions  Pending Prescriptions Disp Refills   TRESIBA FLEXTOUCH 200 UNIT/ML FlexTouch Pen      Sig: Inject 60 Units into the skin at bedtime.      Endocrinology:  Diabetes - Insulins Failed - 05/05/2020 10:03 AM      Failed - HBA1C is between 0 and 7.9 and within 180 days    Hgb A1c MFr Bld  Date Value Ref Range Status  04/13/2020 9.9 (H) 4.8 - 5.6 % Final    Comment:             Prediabetes: 5.7 - 6.4          Diabetes: >6.4          Glycemic control for adults with diabetes: <7.0           Passed - Valid encounter within last 6 months    Recent Outpatient Visits           3 weeks ago Type 2 diabetes mellitus with complication, with long-term current use of insulin [facility])   [facility] at [place], Ranell Patrick, MD   7 years ago    [place] Reyne Dumas, MD   7 years ago    [place] Reyne Dumas, MD   7 years ago Diabetic neuropathy   [place], MD   7 years ago HTN (hypertension)   [place] Theodis Blaze, MD       Future Appointments             In 2 weeks Carlota Raspberry Ranell Patrick, MD Primary Care at [place], [facility]

## 2020-05-05 NOTE — Telephone Encounter (Signed)
Sent!

## 2020-05-06 ENCOUNTER — Other Ambulatory Visit: Payer: Self-pay

## 2020-05-06 MED ORDER — TRESIBA FLEXTOUCH 200 UNIT/ML ~~LOC~~ SOPN: PEN_INJECTOR | SUBCUTANEOUS | 0 refills | Status: DC

## 2020-05-07 NOTE — Telephone Encounter (Signed)
Pt asked for her Rx for Continuous Blood Gluc Receiver (Veteran KIT) DEVI  To go to  [facility] [place] - [address] Phone:  [phone]  Fax:  [phone]     [facility] does not carry the [place] / please send to [facility] instead

## 2020-05-11 ENCOUNTER — Other Ambulatory Visit: Payer: Self-pay | Admitting: Family Medicine

## 2020-05-11 MED ORDER — DEXCOM G5 RECEIVER KIT DEVI
0 refills | Status: DC
Start: 2020-05-11 — End: 2020-06-29

## 2020-05-11 NOTE — Telephone Encounter (Signed)
Pt requesting to be resent to different pharmacy Requested Prescriptions  Pending Prescriptions Disp Refills   Continuous Blood Gluc Receiver (DEXCOM G5 RECEIVER KIT) [place] 1 each 0    Sig: Test 1 time daily     Endocrinology: Diabetes - Testing Supplies Passed - 05/11/2020  4:35 PM      Passed - Valid encounter within last 12 months    Recent Outpatient Visits          4 weeks ago Type 2 diabetes mellitus with complication, with long-term current use of insulin [facility])   [facility] at [place], Ranell Patrick, MD   7 years ago    [place] Reyne Dumas, MD   7 years ago    [place] Reyne Dumas, MD   7 years ago Diabetic neuropathy   [place], Henreitta Leber, MD   7 years ago HTN (hypertension)   [place] Theodis Blaze, MD      Future Appointments            In 1 week Carlota Raspberry Ranell Patrick, MD Primary Care at [place], [facility]

## 2020-05-11 NOTE — Telephone Encounter (Signed)
Medication Refill - Medication: Dexcom Sensor (Patient stated that sensor was sent to wrong pharmacy and she needs the sensor sent to the correct pharmacy. Pt requesting callback from nurse once prescription has been sent to pharmacy)  Has the patient contacted their pharmacy?  Yes (Agent: If no, request that the patient contact the pharmacy for the refill.) (Agent: If yes, when and what did the pharmacy advise?)Contact PCP  Preferred Pharmacy (with phone number or street name):  [facility] [place] - [address] Phone:  [phone]  Fax:  [phone]       Agent: Please be advised that RX refills may take up to 3 business days. We ask that you follow-up with your pharmacy.

## 2020-05-12 NOTE — Telephone Encounter (Signed)
Script needs to be sent to [facility]

## 2020-05-12 NOTE — Addendum Note (Signed)
Addended by: Patrcia Dolly on: 05/12/2020 03:00 PM   Modules accepted: Orders

## 2020-05-12 NOTE — Telephone Encounter (Signed)
Patient was advise by pharmacy to send medication mentioned below to [facility] [phone], please advise when done

## 2020-05-12 NOTE — Addendum Note (Signed)
Addended by: Jefferson Fuel on: 05/12/2020 11:08 AM   Modules accepted: Orders

## 2020-05-19 ENCOUNTER — Telehealth: Payer: Self-pay | Admitting: Family Medicine

## 2020-05-19 ENCOUNTER — Ambulatory Visit: Payer: 59 | Admitting: Physical Therapy

## 2020-05-20 ENCOUNTER — Other Ambulatory Visit: Payer: Self-pay | Admitting: *Deleted

## 2020-05-20 ENCOUNTER — Telehealth: Payer: Self-pay | Admitting: *Deleted

## 2020-05-20 NOTE — Telephone Encounter (Signed)
Spoke with patient gave her number [phone] Kyung Rudd Will cover [place] sensor through [employer].  Spoke with representative patient needs to call set up an account and then [facility] will fax information needed from Primary Care.     Stated the Elenor Legato is covered.    Advised patient it could take up to two weeks once the processes has started.     Voiced understanding.

## 2020-05-20 NOTE — Telephone Encounter (Signed)
Denial of DEXcom G5 reciever

## 2020-05-21 ENCOUNTER — Encounter: Payer: Self-pay | Admitting: Family Medicine

## 2020-05-21 ENCOUNTER — Ambulatory Visit (INDEPENDENT_AMBULATORY_CARE_PROVIDER_SITE_OTHER): Payer: Medicare Other | Admitting: Family Medicine

## 2020-05-21 ENCOUNTER — Other Ambulatory Visit: Payer: Self-pay

## 2020-05-21 ENCOUNTER — Telehealth: Payer: Self-pay | Admitting: Family Medicine

## 2020-05-21 VITALS — BP 121/74 | HR 68 | Temp 97.9°F | Ht 59.0 in | Wt 223.0 lb

## 2020-05-21 DIAGNOSIS — R059 Cough, unspecified: Secondary | ICD-10-CM

## 2020-05-21 DIAGNOSIS — Z794 Long term (current) use of insulin: Secondary | ICD-10-CM

## 2020-05-21 DIAGNOSIS — E118 Type 2 diabetes mellitus with unspecified complications: Secondary | ICD-10-CM

## 2020-05-21 DIAGNOSIS — E1142 Type 2 diabetes mellitus with diabetic polyneuropathy: Secondary | ICD-10-CM

## 2020-05-21 DIAGNOSIS — E162 Hypoglycemia, unspecified: Secondary | ICD-10-CM

## 2020-05-21 DIAGNOSIS — R0981 Nasal congestion: Secondary | ICD-10-CM | POA: Diagnosis not present

## 2020-05-21 MED ORDER — GABAPENTIN 300 MG PO CAPS: 300.0000 mg | ORAL_CAPSULE | Freq: Every day | ORAL | 2 refills | Status: DC

## 2020-05-21 NOTE — Patient Outreach (Addendum)
[name]) Care Management  [place]  05/21/2020   Amy [name] 08/16/1960 [id number]  Subjective: Successful telephone outreach call to patient. HIPAA identifiers obtained. Patient states she has been taking her blood sugar several times weekly with her manual monitor. 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 She is currently working with her PCP to get a [employer] approved through insurance. She has started this process through [employer] who states [facility] Medicare will cover once she and her PCP complete the application. Patient states she did call [employer] and set up an account with them and they are faxing the needed information to her PCP for completion. Patient also stated that she has an upcoming appointment in February to see a Endocrinologist who will take over her diabetic care. Patient's A1c is currently 9.9 from lab draw on 04/13/20. Patient admits that she has not been following a diabetic diet closely and/or checking her blood sugar routinely. She does state that getting the CGM and seeing the diabetic specialist will make a huge difference with her starting to decrease her A1c and get her diabetes under control. Nurse provided verbal education about the importance of staying compliant regarding her self-treatment of diabetes and did encourage the patient to stay on track.Patient did confirm that she did receive both the scale and B/P cuff this nurse sent previously and she is now weighing herself and taking her B/P several times weekly. She reports that her values have been good but cannot see to give this nurse what her aide has recorded. Patient states that she does want to lose weight and she is doing chair exercises but not as routinely as she needs. Patient shares she is motivated to do better with both her diet and exercise to improve her overall health and wellness. Patient did not have any further questions or concerns today and did  confirm that she has this nurse's contact number to call her if needed.   Encounter Medications:  Outpatient Encounter Medications as of 05/20/2020  Medication Sig Note  . albuterol (PROVENTIL HFA;VENTOLIN HFA) 108 (90 BASE) MCG/ACT inhaler Inhale 2 puffs into the lungs every 4 (four) hours as needed for wheezing or shortness of breath. For short   . aspirin EC 81 MG tablet Take 81 mg by mouth daily.   . B-D UF III MINI PEN NEEDLES 31G X 5 MM MISC U 1 PEN NEEDLE QID WITH EACH INJECTION   . cetirizine (ZYRTEC) 10 MG tablet Take 10 mg by mouth daily.   . Continuous Blood Gluc Receiver (DEXCOM G5 RECEIVER KIT) DEVI Test 1 time daily   . Continuous Blood Gluc Receiver (FREESTYLE LIBRE 2 READER) DEVI As instructed. iddm with complications - retinopathy, neuropathy, nephropathy. With device and sensors. (Patient not taking: Reported on 04/21/2020) 04/21/2020: Patient reports she is having difficulty getting this covered by her insurance. Nurse advise her to call her PCP for assistance.  . esomeprazole (NEXIUM) 40 MG capsule Take 40 mg by mouth daily.    Marland Kitchen gabapentin (NEURONTIN) 300 MG capsule Take 1 capsule (300 mg total) by mouth at bedtime.   Marland Kitchen HUMALOG KWIKPEN 100 UNIT/ML KiwkPen Inject 8-14 Units into the skin See admin instructions. Inject 8 units in morning, 12 units at lunch, and 14 units at dinner   . levothyroxine (SYNTHROID) 150 MCG tablet Take 1 tablet (150 mcg total) by mouth daily. Needs appt for future refills   . Linaclotide (LINZESS) 145 MCG CAPS capsule Take  145 mcg by mouth daily as needed (constipation).    . naproxen (NAPROSYN) 500 MG tablet Take 500 mg by mouth 2 (two) times daily with a meal.   . ondansetron (ZOFRAN ODT) 4 MG disintegrating tablet Take 1 tablet (4 mg total) by mouth every 8 (eight) hours as needed for nausea or vomiting. 11/23/2019: Needs refill  . potassium chloride SA (KLOR-CON) 20 MEQ tablet Take 1 tablet (20 mEq total) by mouth daily. Need appt for future refills    . pregabalin (LYRICA) 150 MG capsule Take 150 mg by mouth 3 (three) times daily.    . simvastatin (ZOCOR) 20 MG tablet Take 20 mg by mouth every evening.    Marland Kitchen spironolactone (ALDACTONE) 25 MG tablet Take 0.5 tablets (12.5 mg total) by mouth daily. Needs appt for future refills   . torsemide (DEMADEX) 20 MG tablet Take 4 tablets (80 mg total) by mouth daily. Need appt for future refills   . TRESIBA FLEXTOUCH 200 UNIT/ML FlexTouch Pen Inject 60-120 units at bed time per glucose reading.    No facility-administered encounter medications on file as of 05/20/2020.    Functional Status:  In your present state of health, do you have any difficulty performing the following activities: 10/02/2019 05/29/2019  Hearing? Y N  Comment has hearing aids -  Vision? 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 N N  Walking or climbing stairs? Y Y  Comment per patient due to pain in right leg and hips blind  Dressing or bathing? Y Y  Comment patient has aide daily to assist due to blindness and disabilities blind  Doing errands, shopping? Y Y  Comment yes, patient can cognitively go by herself but need transportation due to blindness. Patient's aide goes to the grocery store blind  Preparing Food and eating ? Y Y  Comment aide does cooking has aide  Using the Toilet? Y N  Comment patient has a hard time wiping herself due to obesity -  In the past six months, have you accidently leaked urine? Y N  Comment takes many diuretics -  Do you have problems with loss of bowel control? N N  Managing your Medications? [name]  Comment gets assistance to manage pill box managed by nurse  Managing your Finances? N N  Housekeeping or managing your Housekeeping? Y Y  Comment has aide to assist blind, has aide  Some recent data might be hidden    Fall/Depression Screening: Fall Risk  05/20/2020 05/20/2020 04/21/2020  Falls in the past year? 0 0 0  Number falls in  past yr: 0 0 0  Injury with Fall? 0 0 0  Risk for fall due to : History of fall(s);Impaired balance/gait;Impaired mobility History of fall(s);Impaired balance/gait;Impaired mobility Impaired balance/gait;Impaired mobility  Follow up Falls prevention discussed;Education provided;Falls evaluation completed Falls prevention discussed;Education provided;Falls evaluation completed Falls prevention discussed;Education provided;Falls evaluation completed   PHQ 2/9 Scores 04/13/2020 10/01/2019 05/29/2019  PHQ - 2 Score 0 3 1  PHQ- 9 Score - 5 -    Assessment:  Goals Addressed            This Visit's Progress   . Monitor and Manage My Blood Sugar   Not on track    Timeframe:  Long-Range Goal Priority:  High Start Date:  03/10/20  Expected End Date: 03/10/21                    Follow up: 09/12/20   - check blood sugar at prescribed times - check blood sugar if I feel it is too high or too low - enter blood sugar readings and medication or insulin into daily log - take the blood sugar meter to all doctor visits    Why is this important?   Checking your blood sugar at home helps to keep it from getting very high or very low.  Writing the results in a diary or log helps the doctor know how to care for you.  Your blood sugar log should have the time, date and the results.  Also, write down the amount of insulin or other medicine that you take.  Other information, like what you ate, exercise done and how you were feeling, will also be helpful.     Notes: Patient states she is taking her blood sugar 2-3 times weekly as this continues to be difficult given she is blind and has a manual monitor. Education was provided regarding the importance of her monitoring her blood sugar for her health especially given that the patient does take insulin. Patient did verbalize understanding and is currently working with her PCP to get a [employer] approved through her insurance.   Updated: 05/20/20    . Perform Foot Care   On track    Timeframe:  Long-Range Goal Priority:  High Start Date:  03/10/20                           Expected End Date: 08/28/20                    Follow up: 09/12/20   - check feet daily for cuts, sores or redness - keep feet up while sitting - trim toenails straight across - wash and dry feet carefully every day - wear comfortable, cotton socks - wear comfortable, well-fitting shoes    Why is this important?   Good foot care is very important when you have diabetes.  There are many things you can do to keep your feet healthy and catch a problem early.    Notes: Patient sits with her feet elevated. She states her aide looks at her feet daily and she sees podiatrist [facility] routinely. Patient reports that she has a "blister" on her right foot. Nurse discussed for the patient to monitor the site closely.   Updated: 05/20/20       Plan: RN Health Coach will call patient within the month of April Follow-up:  Patient agrees to Care Plan and Follow-up.  [name] RN, BSN [place] [phone] [email or web address]

## 2020-05-21 NOTE — Telephone Encounter (Signed)
Amy [name] from [facility] called on behalf of patient requesting a nasal spray prescription not listed on medication list, Please assist  [facility] [phone]  address [phone] [place] [phone]

## 2020-05-21 NOTE — Patient Instructions (Addendum)
Decrease tresiba to 50 units for now. If any further low readings, or readings above 250 - let me know. Continue gabapentin for now as that is helping the neuropathy symptoms.  I will check COVID test and flu test today with your recent nasal congestion.  I do recommend isolation from others as much as possible right now until those test results have been received, or at least 5 days from the onset of your symptoms as long as you are not having fevers.  Continue to mask around others for at least 10 days from the onset of your symptoms.  If any new shortness of breath, difficulty drinking fluids, or other worsening symptoms be seen in the emergency room or urgent care.   Hypoglycemia Hypoglycemia occurs when the level of sugar (glucose) in the blood is too low. Hypoglycemia can happen in people who do or do not have diabetes. It can develop quickly, and it can be a medical emergency. For most people with diabetes, a blood glucose level below 70 mg/dL (3.9 mmol/L) is considered hypoglycemia. Glucose is a type of sugar that provides the body's main source of energy. Certain hormones (insulin and glucagon) control the level of glucose in the blood. Insulin lowers blood glucose, and glucagon raises blood glucose. Hypoglycemia can result from having too much insulin in the bloodstream, or from not eating enough food that contains glucose. You may also have reactive hypoglycemia, which happens within 4 hours after eating a meal. What are the causes? Hypoglycemia occurs most often in people who have diabetes and may be caused by:  Diabetes medicine.  Not eating enough, or not eating often enough.  Increased physical activity.  Drinking alcohol on an empty stomach. If you do not have diabetes, hypoglycemia may be caused by:  A tumor in the pancreas.  Not eating enough, or not eating for long periods at a time (fasting).  A severe infection or illness.  Certain medicines. What increases the  risk? Hypoglycemia is more likely to develop in:  People who have diabetes and take medicines to lower blood glucose.  People who abuse alcohol.  People who have a severe illness. What are the signs or symptoms? Mild symptoms Mild hypoglycemia may not cause any symptoms. If you do have symptoms, they may include:  Hunger.  Anxiety.  Sweating and feeling clammy.  Dizziness or feeling light-headed.  Sleepiness.  Nausea.  Increased heart rate.  Headache.  Blurry vision.  Irritability.  Tingling or numbness around the mouth, lips, or tongue.  A change in coordination.  Restless sleep. Moderate symptoms Moderate hypoglycemia can cause:  Mental confusion and poor judgment.  Behavior changes.  Weakness.  Irregular heartbeat. Severe symptoms Severe hypoglycemia is a medical emergency. It can cause:  Fainting.  Seizures.  Loss of consciousness (coma).  Death. How is this diagnosed? Hypoglycemia is diagnosed with a blood test to measure your blood glucose level. This blood test is done while you are having symptoms. Your health care provider may also do a physical exam and review your medical history. How is this treated? This condition can often be treated by immediately eating or drinking something that contains sugar, such as:  Fruit juice, 4-6 oz (120-150 mL).  Regular soda (not diet soda), 4-6 oz (120-150 mL).  Low-fat milk, 4 oz (120 mL).  Several pieces of hard candy.  Sugar or honey, 1 Tbsp (15 mL). Treating hypoglycemia if you have diabetes If you are alert and able to swallow safely, follow the  15:15 rule:  Take 15 grams of a rapid-acting carbohydrate. Talk with your health care provider about how much you should take.  Rapid-acting options include: ? Glucose pills (take 15 grams). ? 6-8 pieces of hard candy. ? 4-6 oz (120-150 mL) of fruit juice. ? 4-6 oz (120-150 mL) of regular (not diet) soda. ? 1 Tbsp (15 mL) honey or sugar.  Check  your blood glucose 15 minutes after you take the carbohydrate.  If the repeat blood glucose level is still at or below 70 mg/dL (3.9 mmol/L), take 15 grams of a carbohydrate again.  If your blood glucose level does not increase above 70 mg/dL (3.9 mmol/L) after 3 tries, seek emergency medical care.  After your blood glucose level returns to normal, eat a meal or a snack within 1 hour.  Treating severe hypoglycemia Severe hypoglycemia is when your blood glucose level is at or below 54 mg/dL (3 mmol/L). Severe hypoglycemia is a medical emergency. Get medical help right away. If you have severe hypoglycemia and you cannot eat or drink, you may need an injection of glucagon. A family member or close friend should learn how to check your blood glucose and how to give you a glucagon injection. Ask your health care provider if you need to have an emergency glucagon injection kit available. Severe hypoglycemia may need to be treated in a hospital. The treatment may include getting glucose through an IV. You may also need treatment for the cause of your hypoglycemia. Follow these instructions at home:  General instructions  Take over-the-counter and prescription medicines only as told by your health care provider.  Monitor your blood glucose as told by your health care provider.  Limit alcohol intake to no more than 1 drink a day for nonpregnant women and 2 drinks a day for men. One drink equals 12 oz of beer (355 mL), 5 oz of wine (148 mL), or 1 oz of hard liquor (44 mL).  Keep all follow-up visits as told by your health care provider. This is important. If you have diabetes:  Always have a rapid-acting carbohydrate snack with you to treat low blood glucose.  Follow your diabetes management plan as directed. Make sure you: ? Know the symptoms of hypoglycemia. It is important to treat it right away to prevent it from becoming severe. ? Take your medicines as directed. ? Follow your exercise  plan. ? Follow your meal plan. Eat on time, and do not skip meals. ? Check your blood glucose as often as directed. Always check before and after exercise. ? Follow your sick day plan whenever you cannot eat or drink normally. Make this plan in advance with your health care provider.  Share your diabetes management plan with people in your workplace, school, and household.  Check your urine for ketones when you are ill and as told by your health care provider.  Carry a medical alert card or wear medical alert jewelry. Contact a health care provider if:  You have problems keeping your blood glucose in your target range.  You have frequent episodes of hypoglycemia. Get help right away if:  You continue to have hypoglycemia symptoms after eating or drinking something containing glucose.  Your blood glucose is at or below 54 mg/dL (3 mmol/L).  You have a seizure.  You faint. These symptoms may represent a serious problem that is an emergency. Do not wait to see if the symptoms will go away. Get medical help right away. Call your local emergency  services (911 in the [place] Summary  Hypoglycemia occurs when the level of sugar (glucose) in the blood is too low.  Hypoglycemia can happen in people who do or do not have diabetes. It can develop quickly, and it can be a medical emergency.  Make sure you know the symptoms of hypoglycemia and how to treat it.  Always have a rapid-acting carbohydrate snack with you to treat low blood sugar. This information is not intended to replace advice given to you by your health care provider. Make sure you discuss any questions you have with your health care provider. Document Revised: 10/23/2017 Document Reviewed: 06/05/2015 Elsevier Patient Education  [employer].      If you have lab work done today you will be contacted with your lab results within the next 2 weeks.  If you have not heard from [place] then please contact us. The fastest way  to get your results is to register for My Chart.   IF you received an x-ray today, you will receive an invoice from [facility]. Please contact [facility] at [phone] with questions or concerns regarding your invoice.   IF you received labwork today, you will receive an invoice from [facility]. Please contact [facility] at [phone] with questions or concerns regarding your invoice.   Our billing staff will not be able to assist you with questions regarding bills from these companies.  You will be contacted with the lab results as soon as they are available. The fastest way to get your results is to activate your My Chart account. Instructions are located on the last page of this paperwork. If you have not heard from [place] regarding the results in 2 weeks, please contact this office.

## 2020-05-21 NOTE — Patient Instructions (Signed)
Goals Addressed            This Visit's Progress   . Monitor and Manage My Blood Sugar   Not on track    Timeframe:  Long-Range Goal Priority:  High Start Date:  03/10/20                           Expected End Date: 03/10/21                    Follow up: 09/12/20   - check blood sugar at prescribed times - check blood sugar if I feel it is too high or too low - enter blood sugar readings and medication or insulin into daily log - take the blood sugar meter to all doctor visits    Why is this important?   Checking your blood sugar at home helps to keep it from getting very high or very low.  Writing the results in a diary or log helps the doctor know how to care for you.  Your blood sugar log should have the time, date and the results.  Also, write down the amount of insulin or other medicine that you take.  Other information, like what you ate, exercise done and how you were feeling, will also be helpful.     Notes: Patient states she is taking her blood sugar 2-3 times weekly as this continues to be difficult given she is blind and has a manual monitor. Education was provided regarding the importance of her monitoring her blood sugar for her health especially given that the patient does take insulin. Patient did verbalize understanding and is currently working with her PCP to get a [employer] approved through her insurance.  Updated: 05/20/20    . Perform Foot Care   On track    Timeframe:  Long-Range Goal Priority:  High Start Date:  03/10/20                           Expected End Date: 08/28/20                    Follow up: 09/12/20   - check feet daily for cuts, sores or redness - keep feet up while sitting - trim toenails straight across - wash and dry feet carefully every day - wear comfortable, cotton socks - wear comfortable, well-fitting shoes    Why is this important?   Good foot care is very important when you have diabetes.  There are many things you can do to  keep your feet healthy and catch a problem early.    Notes: Patient sits with her feet elevated. She states her aide looks at her feet daily and she sees podiatrist [facility] routinely. Patient reports that she has a "blister" on her right foot. Nurse discussed for the patient to monitor the site closely.   Updated: 05/20/20

## 2020-05-21 NOTE — Progress Notes (Signed)
Subjective:  Patient ID: Amy [name], female    DOB: 1961/01/12  Age: 60 y.o. MRN: [id number]  CC:  Chief Complaint  Patient presents with  . Medical Management of Chronic Issues    1 month f/u. Patient also need a refill on medication. Patient stated she have an upcoming appt with the Diabetic Dr on 07/09/20    HPI Amy [name] presents for   Follow-up from November 29 visit, establish care visit at that time. History of diabetes with neuropathy ckd, hyperglycemia.   Treated with gabapentin after ER visit in December. Tingling in body has improved on gabapentin 321m qhs.  Referred to endocrinology for ongoing care. 1st appointment next month- 2/14.  Working on continuous glucose meter. Unsure of home readings, but low of 74 yesterday, few lows including 60 since last visit. Highest reading of 200.  Skips meals at times. Eats breakfast, lunch - later lunch, and dinner around 5:30.  tresiba - 54 units per night.  humalog 8 units with BF, 12 with lunch, 14 with dinner. Morning before is when she has dropped low.  Lab Results  Component Value Date   HGBA1C 9.9 (H) 04/13/2020    At end of history, she notes having nasal congestion past 3 days, some cough. No fever. Eating and drinking ok, not dyspneic. Less appetite, but drinking fluids.  Aide had a cold a few weeks ago.  Has received covid vaccine and booster.   History Patient Active Problem List   Diagnosis Date Noted  . 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 Pre-operative cardiovascular examination 02/18/2019  . Septic bursitis of elbow, right 10/30/2018  . CRI (chronic renal insufficiency), stage 3 (moderate) 07/26/2018  . Morbid obesity ([place] 07/26/2018  . Intractable nausea and vomiting 03/06/2018  . Right leg pain 12/19/2017  . Leukocytosis 12/19/2017  . Benign positional vertigo 12/19/2017  .  Weakness 12/12/2017  . Laryngopharyngeal reflux (LPR) 10/19/2017  . Post-nasal drainage 10/19/2017  . Acute sinusitis 10/19/2017  . Degeneration of lumbar intervertebral disc 07/29/2017  . 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 Obstructive sleep apnea on CPAP 02/09/2017  . Chronic diastolic heart failure ([place] 11/08/2016  . Near syncope 01/30/2016  . SOB (shortness of breath)   . CAP (community acquired pneumonia) 09/12/2015  . Hypoxia 09/12/2015  . Essential hypertension 07/05/2015  . Hypotension 07/05/2015  . Diabetes mellitus with neurological manifestations ([place] 11/04/2014  . Hyperlipidemia LDL goal <70 11/04/2014  . Spinal stenosis, multilevel 11/04/2014  . Hyperkalemia 11/01/2014  . Hypoglycemia 08/09/2014  . Type 2 diabetes mellitus with complication, with long-term current use of insulin ([place] 08/08/2014  . Elevated troponin 08/08/2014  . Nausea vomiting and diarrhea 08/08/2014  . 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 Dizziness 08/06/2012  . Orthostatic hypotension 08/06/2012  . Hypernatremia 08/06/2012  . Acute gastroenteritis 08/13/2011  . Gastroparesis 08/13/2011  . Low back pain 08/13/2011  . Oral thrush 08/13/2011  . Gastroenteritis 05/23/2011  . Dehydration 05/23/2011  . Blindness 05/23/2011  . DM type 1, not at goal, causing eye disease ([place] 05/23/2011  . Asthma 05/23/2011  . Diarrhea 05/23/2011  . Vomiting 05/23/2011  . Hypothyroidism 05/23/2011  . Diabetic neuropathy ([facility] 05/23/2011  . Diabetic nephropathy ([facility] 05/23/2011   Past Medical History:  Diagnosis  Date  . Arthritis   . Asthma   . Blind   . Blindness and low vision    left eye glass eye,  legally blind in right eye  . Diabetes mellitus   . Diabetic neuropathy ([place])   . Hyperlipidemia   . Hypertension   . Hypothyroidism   . Spinal stenosis     Past Surgical History:  Procedure Laterality Date  . ABDOMINAL HYSTERECTOMY  1990  . [place]   x 2  . CHOLECYSTECTOMY  2008  . ENUCLEATION Bilateral 09/15/1998  . EYE SURGERY  2016   fitting artificial eye   Allergies  Allergen Reactions  . Penicillins Hives and Swelling    Has patient had a PCN reaction causing immediate rash, facial/tongue/throat swelling, SOB or lightheadedness with hypotension: yes- face swelling Has patient had a PCN reaction causing severe rash involving mucus membranes or skin necrosis: no Has patient had a PCN reaction that required hospitalization unknown (childhood allergy) Has patient had a PCN reaction occurring within the last 10 years: no If all of the above answers are "NO", then may proceed with Cephalosporin use.   . Tramadol Nausea And Vomiting    Intense nausea  . Vicodin [Hydrocodone-Acetaminophen] Nausea And Vomiting  . Codeine Nausea Only  . Iodine-131 Hives  . Iohexol Hives    Pt developed itching and hives along with nasal congestion; needs 13 hour premeds for future studies, Onset Date: [id number]   . Lisinopril Cough  . Sulfa Antibiotics Nausea And Vomiting   Prior to Admission medications   Medication Sig Start Date End Date Taking? Authorizing Provider  albuterol (PROVENTIL HFA;VENTOLIN HFA) 108 (90 BASE) MCG/ACT inhaler Inhale 2 puffs into the lungs every 4 (four) hours as needed for wheezing or shortness of breath. For short   Yes [provider]  aspirin EC 81 MG tablet Take 81 mg by mouth daily.   Yes [provider]  B-D UF III MINI PEN NEEDLES 31G X 5 MM MISC U 1 PEN NEEDLE QID WITH EACH INJECTION 08/01/18  Yes [provider]  cetirizine (ZYRTEC) 10 MG tablet Take 10 mg by mouth daily.   Yes [provider]  Continuous Blood Gluc Receiver (DEXCOM G5 RECEIVER KIT) DEVI Test 1 time daily 05/11/20  Yes Wendie Agreste, MD  Continuous Blood Gluc Receiver (FREESTYLE LIBRE 2  READER) DEVI As instructed. iddm with complications - retinopathy, neuropathy, nephropathy. With device and sensors. 04/13/20  Yes Wendie Agreste, MD  esomeprazole (NEXIUM) 40 MG capsule Take 40 mg by mouth daily.    Yes [provider]  gabapentin (NEURONTIN) 300 MG capsule Take 1 capsule (300 mg total) by mouth at bedtime. 04/23/20  Yes Isla Pence, MD  HUMALOG KWIKPEN 100 UNIT/ML KiwkPen Inject 8-14 Units into the skin See admin instructions. Inject 8 units in morning, 12 units at lunch, and 14 units at dinner 01/24/17  Yes [provider]  levothyroxine (SYNTHROID) 150 MCG tablet Take 1 tablet (150 mcg total) by mouth daily. Needs appt for future refills 04/30/20  Yes Bensimhon, Shaune Pascal, MD  linaclotide [facility]) 145 MCG CAPS capsule Take 145 mcg by mouth daily as needed (constipation).    Yes [provider]  naproxen (NAPROSYN) 500 MG tablet Take 500 mg by mouth 2 (two) times daily with a meal.   Yes [provider]  ondansetron (ZOFRAN ODT) 4 MG disintegrating tablet Take 1 tablet (4 mg total) by mouth every 8 (eight) hours  as needed for nausea or vomiting. 07/09/19  Yes Antonietta Breach, PA-C  potassium chloride SA (KLOR-CON) 20 MEQ tablet Take 1 tablet (20 mEq total) by mouth daily. Need appt for future refills 04/30/20  Yes Bensimhon, Shaune Pascal, MD  pregabalin (LYRICA) 150 MG capsule Take 150 mg by mouth 3 (three) times daily.  01/01/18  Yes [provider]  simvastatin (ZOCOR) 20 MG tablet Take 20 mg by mouth every evening.    Yes [provider]  spironolactone (ALDACTONE) 25 MG tablet Take 0.5 tablets (12.5 mg total) by mouth daily. Needs appt for future refills 04/30/20  Yes Bensimhon, Shaune Pascal, MD  torsemide (DEMADEX) 20 MG tablet Take 4 tablets (80 mg total) by mouth daily. Need appt for future refills 04/30/20  Yes Bensimhon, Shaune Pascal, MD  TRESIBA FLEXTOUCH 200 UNIT/ML FlexTouch Pen Inject 60-120 units at bed time per glucose  reading. 05/06/20  Yes Wendie Agreste, MD   Social History   Socioeconomic History  . Marital status: Single    Spouse name: Not on file  . Number of children: 2  . Years of education: 30  . Highest education level: Not on file  Occupational History  . Occupation: N/A    Comment: disabled  Tobacco Use  . Smoking status: Former Smoker    Types: Cigarettes    Quit date: 05/22/1992    Years since quitting: 28.0  . Smokeless tobacco: Never Used  Vaping Use  . Vaping Use: Never used  Substance and Sexual Activity  . Alcohol use: No    Comment: quit 20 yrs ago  . Drug use: No  . Sexual activity: Not Currently  Other Topics Concern  . Not on file  Social History Narrative   Lives alone   caffeine drinks 2 cups of coffee a day, occasional soda    Social Determinants of Health   Financial Resource Strain: Medium Risk  . Difficulty of Paying Living Expenses: Somewhat hard  Food Insecurity: No Food Insecurity  . Worried About [name] in the Last Year: Never true  . Ran Out of Food in the Last Year: Never true  Transportation Needs: No Transportation Needs  . Lack of Transportation (Medical): No  . Lack of Transportation (Non-Medical): No  Physical Activity: Not on file  Stress: Not on file  Social Connections: Not on file  Intimate Partner Violence: Not on file    Review of Systems  Per HPI.  Objective:   Vitals:   05/21/20 1040  BP: 121/74  Pulse: 68  Temp: 97.9 F (36.6 C)  TempSrc: Temporal  SpO2: 98%  Weight: 223 lb (101.2 kg)  Height: '4\' 11"'  (1.499 m)     Physical Exam Vitals reviewed.  Constitutional:      Appearance: She is well-developed and well-nourished.  HENT:     Head: Normocephalic and atraumatic.  Eyes:     Extraocular Movements: EOM normal.     Conjunctiva/sclera: Conjunctivae normal.     Pupils: Pupils are equal, round, and reactive to light.  Neck:     Vascular: No carotid bruit.  Cardiovascular:     Rate and Rhythm:  Normal rate and regular rhythm.     Pulses: Intact distal pulses.     Heart sounds: Normal heart sounds.  Pulmonary:     Effort: Pulmonary effort is normal. No respiratory distress.     Breath sounds: Normal breath sounds. No stridor. No wheezing, rhonchi or rales.  Abdominal:  Palpations: Abdomen is soft. There is no pulsatile mass.     Tenderness: There is no abdominal tenderness.  Musculoskeletal:     Right lower leg: No edema.     Left lower leg: No edema.  Skin:    General: Skin is warm and dry.  Neurological:     Mental Status: She is alert and oriented to person, place, and time.  Psychiatric:        Mood and Affect: Mood and affect and mood normal.        Behavior: Behavior normal.        Assessment & Plan:  Amy [name] is a 60 y.o. female . Type 2 diabetes mellitus with complication, with long-term current use of insulin ([place]) Hypoglycemia  -Follow-up with endocrinology pending, difficult situation with achieving control but also with hypoglycemia.  We will temporarily decrease long-acting insulin with potential further changes to be determined.  Hypoglycemia precautions given.  Diabetic polyneuropathy associated with type 2 diabetes mellitus ([place]) - Plan: gabapentin (NEURONTIN) 300 MG capsule  -Continue gabapentin for now as some improvement.  Cough - Plan: COVID-19, Flu A+B and RSV Nasal congestion - Plan: COVID-19, Flu A+B and RSV  -Check viral test as above, symptomatic care with ER precautions given, isolation precautions and masking discussed for now    Meds ordered this encounter  Medications  . gabapentin (NEURONTIN) 300 MG capsule    Sig: Take 1 capsule (300 mg total) by mouth at bedtime.    Dispense:  30 capsule    Refill:  2   Patient Instructions   Decrease tresiba to 50 units for now. If any further low readings, or readings above 250 - let me know. Continue gabapentin for now as that is helping the neuropathy symptoms.  I will check  COVID test and flu test today with your recent nasal congestion.  I do recommend isolation from others as much as possible right now until those test results have been received, or at least 5 days from the onset of your symptoms as long as you are not having fevers.  Continue to mask around others for at least 10 days from the onset of your symptoms.  If any new shortness of breath, difficulty drinking fluids, or other worsening symptoms be seen in the emergency room or urgent care.   Hypoglycemia Hypoglycemia occurs when the level of sugar (glucose) in the blood is too low. Hypoglycemia can happen in people who do or do not have diabetes. It can develop quickly, and it can be a medical emergency. For most people with diabetes, a blood glucose level below 70 mg/dL (3.9 mmol/L) is considered hypoglycemia. Glucose is a type of sugar that provides the body's main source of energy. Certain hormones (insulin and glucagon) control the level of glucose in the blood. Insulin lowers blood glucose, and glucagon raises blood glucose. Hypoglycemia can result from having too much insulin in the bloodstream, or from not eating enough food that contains glucose. You may also have reactive hypoglycemia, which happens within 4 hours after eating a meal. What are the causes? Hypoglycemia occurs most often in people who have diabetes and may be caused by:  Diabetes medicine.  Not eating enough, or not eating often enough.  Increased physical activity.  Drinking alcohol on an empty stomach. If you do not have diabetes, hypoglycemia may be caused by:  A tumor in the pancreas.  Not eating enough, or not eating for long periods at a time (fasting).  A severe infection or illness.  Certain medicines. What increases the risk? Hypoglycemia is more likely to develop in:  People who have diabetes and take medicines to lower blood glucose.  People who abuse alcohol.  People who have a severe illness. What are  the signs or symptoms? Mild symptoms Mild hypoglycemia may not cause any symptoms. If you do have symptoms, they may include:  Hunger.  Anxiety.  Sweating and feeling clammy.  Dizziness or feeling light-headed.  Sleepiness.  Nausea.  Increased heart rate.  Headache.  Blurry vision.  Irritability.  Tingling or numbness around the mouth, lips, or tongue.  A change in coordination.  Restless sleep. Moderate symptoms Moderate hypoglycemia can cause:  Mental confusion and poor judgment.  Behavior changes.  Weakness.  Irregular heartbeat. Severe symptoms Severe hypoglycemia is a medical emergency. It can cause:  Fainting.  Seizures.  Loss of consciousness (coma).  Death. How is this diagnosed? Hypoglycemia is diagnosed with a blood test to measure your blood glucose level. This blood test is done while you are having symptoms. Your health care provider may also do a physical exam and review your medical history. How is this treated? This condition can often be treated by immediately eating or drinking something that contains sugar, such as:  Fruit juice, 4-6 oz (120-150 mL).  Regular soda (not diet soda), 4-6 oz (120-150 mL).  Low-fat milk, 4 oz (120 mL).  Several pieces of hard candy.  Sugar or honey, 1 Tbsp (15 mL). Treating hypoglycemia if you have diabetes If you are alert and able to swallow safely, follow the 15:15 rule:  Take 15 grams of a rapid-acting carbohydrate. Talk with your health care provider about how much you should take.  Rapid-acting options include: ? Glucose pills (take 15 grams). ? 6-8 pieces of hard candy. ? 4-6 oz (120-150 mL) of fruit juice. ? 4-6 oz (120-150 mL) of regular (not diet) soda. ? 1 Tbsp (15 mL) honey or sugar.  Check your blood glucose 15 minutes after you take the carbohydrate.  If the repeat blood glucose level is still at or below 70 mg/dL (3.9 mmol/L), take 15 grams of a carbohydrate again.  If your  blood glucose level does not increase above 70 mg/dL (3.9 mmol/L) after 3 tries, seek emergency medical care.  After your blood glucose level returns to normal, eat a meal or a snack within 1 hour.  Treating severe hypoglycemia Severe hypoglycemia is when your blood glucose level is at or below 54 mg/dL (3 mmol/L). Severe hypoglycemia is a medical emergency. Get medical help right away. If you have severe hypoglycemia and you cannot eat or drink, you may need an injection of glucagon. A family member or close friend should learn how to check your blood glucose and how to give you a glucagon injection. Ask your health care provider if you need to have an emergency glucagon injection kit available. Severe hypoglycemia may need to be treated in a hospital. The treatment may include getting glucose through an IV. You may also need treatment for the cause of your hypoglycemia. Follow these instructions at home:  General instructions  Take over-the-counter and prescription medicines only as told by your health care provider.  Monitor your blood glucose as told by your health care provider.  Limit alcohol intake to no more than 1 drink a day for nonpregnant women and 2 drinks a day for men. One drink equals 12 oz of beer (355 mL), 5 oz of wine (148  mL), or 1 oz of hard liquor (44 mL).  Keep all follow-up visits as told by your health care provider. This is important. If you have diabetes:  Always have a rapid-acting carbohydrate snack with you to treat low blood glucose.  Follow your diabetes management plan as directed. Make sure you: ? Know the symptoms of hypoglycemia. It is important to treat it right away to prevent it from becoming severe. ? Take your medicines as directed. ? Follow your exercise plan. ? Follow your meal plan. Eat on time, and do not skip meals. ? Check your blood glucose as often as directed. Always check before and after exercise. ? Follow your sick day plan whenever  you cannot eat or drink normally. Make this plan in advance with your health care provider.  Share your diabetes management plan with people in your workplace, school, and household.  Check your urine for ketones when you are ill and as told by your health care provider.  Carry a medical alert card or wear medical alert jewelry. Contact a health care provider if:  You have problems keeping your blood glucose in your target range.  You have frequent episodes of hypoglycemia. Get help right away if:  You continue to have hypoglycemia symptoms after eating or drinking something containing glucose.  Your blood glucose is at or below 54 mg/dL (3 mmol/L).  You have a seizure.  You faint. These symptoms may represent a serious problem that is an emergency. Do not wait to see if the symptoms will go away. Get medical help right away. Call your local emergency services (911 in the [place] Summary  Hypoglycemia occurs when the level of sugar (glucose) in the blood is too low.  Hypoglycemia can happen in people who do or do not have diabetes. It can develop quickly, and it can be a medical emergency.  Make sure you know the symptoms of hypoglycemia and how to treat it.  Always have a rapid-acting carbohydrate snack with you to treat low blood sugar. This information is not intended to replace advice given to you by your health care provider. Make sure you discuss any questions you have with your health care provider. Document Revised: 10/23/2017 Document Reviewed: 06/05/2015 Elsevier Patient Education  [employer].      If you have lab work done today you will be contacted with your lab results within the next 2 weeks.  If you have not heard from [place] then please contact us. The fastest way to get your results is to register for My Chart.   IF you received an x-ray today, you will receive an invoice from [facility]. Please contact [facility] at [phone] with  questions or concerns regarding your invoice.   IF you received labwork today, you will receive an invoice from [facility]. Please contact [facility] at [phone] with questions or concerns regarding your invoice.   Our billing staff will not be able to assist you with questions regarding bills from these companies.  You will be contacted with the lab results as soon as they are available. The fastest way to get your results is to activate your My Chart account. Instructions are located on the last page of this paperwork. If you have not heard from [place] regarding the results in 2 weeks, please contact this office.          Signed, Merri Ray, MD [facility]

## 2020-05-22 ENCOUNTER — Encounter: Payer: Self-pay | Admitting: Family Medicine

## 2020-05-22 NOTE — Telephone Encounter (Signed)
Patient was seen yday. No mention of a nasal spray on AVS. Do not see on patient's med list. Is this okay to refill

## 2020-05-22 NOTE — Telephone Encounter (Signed)
More info- what nasal spray?

## 2020-05-23 LAB — COVID-19, FLU A+B AND RSV
Influenza A, NAA: NOT DETECTED
Influenza B, NAA: NOT DETECTED
RSV, NAA: NOT DETECTED
SARS-CoV-2, NAA: DETECTED — AB

## 2020-05-25 NOTE — Telephone Encounter (Signed)
I did ask the pt and she did not say anything about a nasal spray.   Also per provider request I did call pt and informed her of her COVID results. I did give her the options of other treatments since she is covid pos, however pt stated that she just wanted to stay at home I stated understanding.

## 2020-06-08 ENCOUNTER — Encounter: Payer: Self-pay | Admitting: Family Medicine

## 2020-06-08 ENCOUNTER — Ambulatory Visit (INDEPENDENT_AMBULATORY_CARE_PROVIDER_SITE_OTHER): Payer: 59 | Admitting: Family Medicine

## 2020-06-08 ENCOUNTER — Other Ambulatory Visit: Payer: Self-pay

## 2020-06-08 VITALS — BP 142/80 | HR 75 | Temp 97.8°F | Ht 59.0 in | Wt 212.0 lb

## 2020-06-08 DIAGNOSIS — M5412 Radiculopathy, cervical region: Secondary | ICD-10-CM | POA: Diagnosis not present

## 2020-06-08 DIAGNOSIS — M62838 Other muscle spasm: Secondary | ICD-10-CM

## 2020-06-08 DIAGNOSIS — Z794 Long term (current) use of insulin: Secondary | ICD-10-CM | POA: Diagnosis not present

## 2020-06-08 DIAGNOSIS — E118 Type 2 diabetes mellitus with unspecified complications: Secondary | ICD-10-CM

## 2020-06-08 MED ORDER — CYCLOBENZAPRINE HCL 5 MG PO TABS: ORAL_TABLET | ORAL | 0 refills | Status: DC

## 2020-06-08 NOTE — Patient Instructions (Addendum)
Upper back and arm pain may be related to muscle spasm and possible pinched nerve in your neck.  Can try muscle relaxant initially, be careful as that medicine can cause sedation and risk of falling.  Potentially may need prednisone, but that can make your blood sugar significantly worse so we will need to see blood sugar readings first.  If pain is not improving in the next few days, let me know and we potentially could do prednisone with close monitoring of your blood sugar.  Tylenol can also be taken with the muscle relaxant if needed.  Return to the clinic or go to the nearest emergency room if any of your symptoms worsen or new symptoms occur.   Cervical Radiculopathy  Cervical radiculopathy happens when a nerve in the neck (a cervical nerve) is pinched or bruised. This condition can happen because of an injury to the cervical spine (vertebrae) in the neck, or as part of the normal aging process. Pressure on the cervical nerves can cause pain or numbness that travels from the neck all the way down into the arm and fingers. Usually, this condition gets better with rest. Treatment may be needed if the condition does not improve. What are the causes? This condition may be caused by:  A neck injury.  A bulging (herniated) disk.  Muscle spasms.  Muscle tightness in the neck because of overuse.  Arthritis.  Breakdown or degeneration in the bones and joints of the spine (spondylosis) due to aging.  Bone spurs that may develop near the cervical nerves. What are the signs or symptoms? Symptoms of this condition include:  Pain. The pain may travel from the neck to the arm and hand. The pain can be severe or irritating. It may be worse when you move your neck.  Numbness or tingling in your arm or hand.  Weakness in the affected arm and hand, in severe cases. How is this diagnosed? This condition may be diagnosed based on your symptoms, your medical history, and a physical exam. You may  also have tests, including:  X-rays.  A CT scan.  An MRI.  An electromyogram (EMG).  Nerve conduction tests. How is this treated? In many cases, treatment is not needed for this condition. With rest, the condition usually gets better over time. If treatment is needed, options may include:  Wearing a soft neck collar (cervical collar) for short periods of time, as told by your health care provider.  Doing physical therapy to strengthen your neck muscles.  Taking medicines, such as NSAIDs or oral corticosteroids.  Having spinal injections, in severe cases.  Having surgery. This may be needed if other treatments do not help. Different types of surgery may be done depending on the cause of this condition. Follow these instructions at home: If you have a cervical collar:  Wear it as told by your health care provider. Remove it only as told by your health care provider.  Ask your health care provider if you can remove the collar for cleaning and bathing. If you are allowed to remove the collar for cleaning or bathing: ? Follow instructions from your health care provider about how to remove the collar safely. ? Clean the collar by wiping it with mild soap and water and drying it completely. ? Take out any removable pads in the collar every 1-2 days, and wash them by hand with soap and water. Let them air-dry completely before you put them back in the collar. ? Check your skin  under the collar for irritation or sores. If you see any, tell your health care provider. Managing pain  Take over-the-counter and prescription medicines only as told by your health care provider.  If directed, put ice on the affected area. ? If you have a soft neck collar, remove it as told by your health care provider. ? Put ice in a plastic bag. ? Place a towel between your skin and the bag. ? Leave the ice on for 20 minutes, 2-3 times a day.  If applying ice does not help, you can try using heat. Use the  heat source that your health care provider recommends, such as a moist heat pack or a heating pad. ? Place a towel between your skin and the heat source. ? Leave the heat on for 20-30 minutes. ? Remove the heat if your skin turns bright red. This is especially important if you are unable to feel pain, heat, or cold. You may have a greater risk of getting burned.  Try a gentle neck and shoulder massage to help relieve symptoms.      Activity  Rest as needed.  Return to your normal activities as told by your health care provider. Ask your health care provider what activities are safe for you.  Do stretching and strengthening exercises as told by your health care provider or physical therapist.  Do not lift anything that is heavier than 10 lb (4.5 kg) until your health care provider tells you that it is safe. General instructions  Use a flat pillow when you sleep.  Do not drive while wearing a cervical collar. If you do not have a cervical collar, ask your health care provider if it is safe to drive while your neck heals.  Ask your health care provider if the medicine prescribed to you requires you to avoid driving or using heavy machinery.  Do not use any products that contain nicotine or tobacco, such as cigarettes, e-cigarettes, and chewing tobacco. These can delay healing. If you need help quitting, ask your health care provider.  Keep all follow-up visits as told by your health care provider. This is important. Contact a health care provider if:  Your condition does not improve with treatment. Get help right away if:  Your pain gets much worse and cannot be controlled with medicines.  You have weakness or numbness in your hand, arm, face, or leg.  You have a high fever.  You have a stiff, rigid neck.  You lose control of your bowels or your bladder (have incontinence).  You have trouble with walking, balance, or speaking. Summary  Cervical radiculopathy happens when a  nerve in the neck is pinched or bruised.  A nerve can get pinched from a bulging disk, arthritis, muscle spasms, or an injury to the neck.  Symptoms include pain, tingling, or numbness radiating from the neck into the arm or hand. Weakness can also occur in severe cases.  Treatment may include rest, wearing a cervical collar, and physical therapy. Medicines may be prescribed to help with pain. In severe cases, injections or surgery may be needed. This information is not intended to replace advice given to you by your health care provider. Make sure you discuss any questions you have with your health care provider. Document Revised: 03/23/2018 Document Reviewed: 03/23/2018 [employer] Patient Education  2021 [employer].   If you have lab work done today you will be contacted with your lab results within the next 2 weeks.  If you  have not heard from [place] then please contact us. The fastest way to get your results is to register for My Chart.   IF you received an x-ray today, you will receive an invoice from [facility]. Please contact [facility] at [phone] with questions or concerns regarding your invoice.   IF you received labwork today, you will receive an invoice from [place]. Please contact [facility] at [phone] with questions or concerns regarding your invoice.   Our billing staff will not be able to assist you with questions regarding bills from these companies.  You will be contacted with the lab results as soon as they are available. The fastest way to get your results is to activate your My Chart account. Instructions are located on the last page of this paperwork. If you have not heard from [place] regarding the results in 2 weeks, please contact this office.

## 2020-06-08 NOTE — Progress Notes (Signed)
Subjective:  Patient ID: Amy [name], female    DOB: 28-Feb-1961  Age: 60 y.o. MRN: [id number]  CC:  Chief Complaint  Patient presents with  . Shoulder Pain    Pt reports pain, weakness, and edema in her L shoulder blade, shoulder and L arm. Pt states  the pain started about 4 days ago. Pt states tylenol extra strength isn't helping. No known tram to the area.    HPI Amy [name] presents for   Left shoulder blade, upper back pain, arm pain. Some soreness in lower neck.  Started 4 days ago.  No known injury.  Tylenol Extra Strength with minimal relief. No prior similar sx's. No relief with heat.  Left upper arm feels swollen.past few days?  Just noticed this am.  Pain radiates down arm to hand at times.  Trouble with grasping due to discomfort at times in hand only, no arm weakness.   CT cervical spine 01/08/19: IMPRESSION: 1. No acute finding. 2. 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 3. 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 No evident change from a 2019 cervical MRI.   At end of visit - requests rx for compression stockings.  New prescription given for knee-high and low/mild compression.  History of CHF, chronic diastolic  History Patient Active Problem List   Diagnosis Date Noted  . 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 Pre-operative cardiovascular examination 02/18/2019  . Septic bursitis of elbow, right 10/30/2018  . CRI (chronic renal insufficiency), stage 3 (moderate) 07/26/2018  . Morbid obesity ([place]) 07/26/2018  . Intractable nausea and vomiting 03/06/2018  . Right leg pain 12/19/2017  . Leukocytosis 12/19/2017  . Benign positional vertigo 12/19/2017  . Weakness 12/12/2017  . Laryngopharyngeal reflux (LPR)  10/19/2017  . Post-nasal drainage 10/19/2017  . Acute sinusitis 10/19/2017  . Degeneration of lumbar intervertebral disc 07/29/2017  . 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 Obstructive sleep apnea on CPAP 02/09/2017  . Chronic diastolic heart failure ([place]) 11/08/2016  . Near syncope 01/30/2016  . SOB (shortness of breath)   . CAP (community acquired pneumonia) 09/12/2015  . Hypoxia 09/12/2015  . Essential hypertension 07/05/2015  . Hypotension 07/05/2015  . Diabetes mellitus with neurological manifestations ([place]) 11/04/2014  . Hyperlipidemia LDL goal <70 11/04/2014  . Spinal stenosis, multilevel 11/04/2014  . Hyperkalemia 11/01/2014  . Hypoglycemia 08/09/2014  . Type 2 diabetes mellitus with complication, with long-term current use of insulin ([place]) 08/08/2014  . Elevated troponin 08/08/2014  . Nausea vomiting and diarrhea 08/08/2014  . 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 Dizziness 08/06/2012  . Orthostatic hypotension 08/06/2012  . Hypernatremia 08/06/2012  . Acute gastroenteritis 08/13/2011  . Gastroparesis 08/13/2011  . Low back pain 08/13/2011  . Oral thrush 08/13/2011  . Gastroenteritis 05/23/2011  . Dehydration 05/23/2011  . Blindness 05/23/2011  . DM type 1, not at goal, causing eye disease ([place]) 05/23/2011  . Asthma 05/23/2011  . Diarrhea 05/23/2011  . Vomiting 05/23/2011  . Hypothyroidism 05/23/2011  . Diabetic neuropathy ([place]) 05/23/2011  . Diabetic nephropathy ([place]) 05/23/2011   Past Medical History:  Diagnosis Date  . Arthritis   . Asthma   . Blind   . Blindness  and low vision    left eye glass eye,  legally blind in right eye  . Diabetes mellitus   . Diabetic neuropathy ([place])   . Hyperlipidemia   . Hypertension   . Hypothyroidism   . Spinal stenosis    Past Surgical History:  Procedure Laterality  Date  . ABDOMINAL HYSTERECTOMY  1990  . [place]   x 2  . CHOLECYSTECTOMY  2008  . ENUCLEATION Bilateral 09/15/1998  . EYE SURGERY  2016   fitting artificial eye   Allergies  Allergen Reactions  . Penicillins Hives and Swelling    Has patient had a PCN reaction causing immediate rash, facial/tongue/throat swelling, SOB or lightheadedness with hypotension: yes- face swelling Has patient had a PCN reaction causing severe rash involving mucus membranes or skin necrosis: no Has patient had a PCN reaction that required hospitalization unknown (childhood allergy) Has patient had a PCN reaction occurring within the last 10 years: no If all of the above answers are "NO", then may proceed with Cephalosporin use.   . Tramadol Nausea And Vomiting    Intense nausea  . Vicodin [Hydrocodone-Acetaminophen] Nausea And Vomiting  . Codeine Nausea Only  . Iodine-131 Hives  . Iohexol Hives    Pt developed itching and hives along with nasal congestion; needs 13 hour premeds for future studies, Onset Date: [id number]   . Lisinopril Cough  . Sulfa Antibiotics Nausea And Vomiting   Prior to Admission medications   Medication Sig Start Date End Date Taking? Authorizing Provider  albuterol (PROVENTIL HFA;VENTOLIN HFA) 108 (90 BASE) MCG/ACT inhaler Inhale 2 puffs into the lungs every 4 (four) hours as needed for wheezing or shortness of breath. For short   Yes [provider]  aspirin EC 81 MG tablet Take 81 mg by mouth daily.   Yes [provider]  B-D UF III MINI PEN NEEDLES 31G X 5 MM MISC U 1 PEN NEEDLE QID WITH EACH INJECTION 08/01/18  Yes [provider]  cetirizine (ZYRTEC) 10 MG tablet Take 10 mg by mouth daily.   Yes [provider]  Continuous Blood Gluc Receiver (DEXCOM G5 RECEIVER KIT) DEVI Test 1 time daily 05/11/20  Yes Wendie Agreste, MD  Continuous Blood Gluc Receiver (FREESTYLE LIBRE 2 READER) DEVI As instructed. iddm with  complications - retinopathy, neuropathy, nephropathy. With device and sensors. 04/13/20  Yes Wendie Agreste, MD  esomeprazole (NEXIUM) 40 MG capsule Take 40 mg by mouth daily.    Yes [provider]  gabapentin (NEURONTIN) 300 MG capsule Take 1 capsule (300 mg total) by mouth at bedtime. 05/21/20  Yes Wendie Agreste, MD  HUMALOG KWIKPEN 100 UNIT/ML KiwkPen Inject 8-14 Units into the skin See admin instructions. Inject 8 units in morning, 12 units at lunch, and 14 units at dinner 01/24/17  Yes [provider]  levothyroxine (SYNTHROID) 150 MCG tablet Take 1 tablet (150 mcg total) by mouth daily. Needs appt for future refills 04/30/20  Yes Bensimhon, Shaune Pascal, MD  linaclotide [facility]) 145 MCG CAPS capsule Take 145 mcg by mouth daily as needed (constipation).    Yes [provider]  naproxen (NAPROSYN) 500 MG tablet Take 500 mg by mouth 2 (two) times daily with a meal.   Yes [provider]  ondansetron (ZOFRAN ODT) 4 MG disintegrating tablet Take 1 tablet (4 mg total) by mouth every 8 (eight) hours as needed for nausea or vomiting. 07/09/19  Yes Antonietta Breach, PA-C  potassium chloride  SA (KLOR-CON) 20 MEQ tablet Take 1 tablet (20 mEq total) by mouth daily. Need appt for future refills 04/30/20  Yes Bensimhon, Shaune Pascal, MD  simvastatin (ZOCOR) 20 MG tablet Take 20 mg by mouth every evening.    Yes [provider]  spironolactone (ALDACTONE) 25 MG tablet Take 0.5 tablets (12.5 mg total) by mouth daily. Needs appt for future refills 04/30/20  Yes Bensimhon, Shaune Pascal, MD  torsemide (DEMADEX) 20 MG tablet Take 4 tablets (80 mg total) by mouth daily. Need appt for future refills 04/30/20  Yes Bensimhon, Shaune Pascal, MD  TRESIBA FLEXTOUCH 200 UNIT/ML FlexTouch Pen Inject 60-120 units at bed time per glucose reading. 05/06/20  Yes Wendie Agreste, MD   Social History   Socioeconomic History  . Marital status: Single    Spouse name: Not on file  . Number of  children: 2  . Years of education: 34  . Highest education level: Not on file  Occupational History  . Occupation: N/A    Comment: disabled  Tobacco Use  . Smoking status: Former Smoker    Types: Cigarettes    Quit date: 05/22/1992    Years since quitting: 28.0  . Smokeless tobacco: Never Used  Vaping Use  . Vaping Use: Never used  Substance and Sexual Activity  . Alcohol use: No    Comment: quit 20 yrs ago  . Drug use: No  . Sexual activity: Not Currently  Other Topics Concern  . Not on file  Social History Narrative   Lives alone   caffeine drinks 2 cups of coffee a day, occasional soda    Social Determinants of Health   Financial Resource Strain: Medium Risk  . Difficulty of Paying Living Expenses: Somewhat hard  Food Insecurity: No Food Insecurity  . Worried About [name] in the Last Year: Never true  . Ran Out of Food in the Last Year: Never true  Transportation Needs: Not on file  Physical Activity: Not on file  Stress: Not on file  Social Connections: Not on file  Intimate Partner Violence: Not on file    Review of Systems   Objective:   Vitals:   06/08/20 1407 06/08/20 1416  BP: (!) 160/82 (!) 142/80  Pulse: 75   Temp: 97.8 F (36.6 C)   TempSrc: Temporal   SpO2: 95%   Weight: 212 lb (96.2 kg)   Height: _0  (1.499 m)      Physical Exam Constitutional:      General: She is not in acute distress.    Appearance: She is well-developed and well-nourished.  HENT:     Head: Normocephalic and atraumatic.  Cardiovascular:     Rate and Rhythm: Normal rate.  Pulmonary:     Effort: Pulmonary effort is normal.  Musculoskeletal:        General: Tenderness present.     Comments: C-spine no midline bony tenderness, discomfort into upper back, down the left arm with extension lateral flexion and rotation left greater than right.  Upper back with spasm and tender to palpation over upper trapezius.  Somewhat guarded exam. Skin intact without  erythema.  Slight discomfort across deltoid, upper arm but no appreciable erythema, wounds, or focal swelling.  Circumference of upper arm similar at 15 cm proximal to olecranon(1 cm larger on left).  Position of shoulder, elbow does not appear to cause pain.  Wrist and hand nontender.  Neurovascular intact distally with pulses 2+, cap refill less than 1 second.  No arm/hand vein swelling.   Neurological:     Mental Status: She is alert and oriented to person, place, and time.  Psychiatric:        Mood and Affect: Mood and affect normal.   Upper extremity strength intact, equal bilaterally except slight decreased grip strength on left versus right with visible discomfort.   Assessment & Plan:  Amy [name] is a 60 y.o. female . Trapezius muscle spasm - Plan: cyclobenzaprine (FLEXERIL) 5 MG tablet  Type 2 diabetes mellitus with complication, with long-term current use of insulin (HCC) - Plan: Fructosamine, Basic metabolic panel  Cervical radiculopathy - Plan: cyclobenzaprine (FLEXERIL) 5 MG tablet  40 history of left upper back to left arm pain, spasm and based on exam suspect cervical radiculopathy or cervical source.  No apparent sign of upper extremity DVT.  No appreciable weakness except discomfort with grip strength which also may be related to cervical source.   No injury/trauma, no midline tenderness on cervical spine exam.  Imaging deferred at present.  -  Unfortunately with her diabetes, decreased control on last A1c hesitant to start prednisone at this time.  Check labs.  We will try Flexeril with potential side effects discussed, if not improving next few days may need to start prednisone with close monitoring of glucose.  Labs obtained today.  RTC/ER precautions   Meds ordered this encounter  Medications  . cyclobenzaprine (FLEXERIL) 5 MG tablet    Sig: 1 pill by mouth up to every 8 hours as needed. Start with one pill by mouth each bedtime as needed due to sedation     Dispense:  15 tablet    Refill:  0   Patient Instructions    Upper back and arm pain may be related to muscle spasm and possible pinched nerve in your neck.  Can try muscle relaxant initially, be careful as that medicine can cause sedation and risk of falling.  Potentially may need prednisone, but that can make your blood sugar significantly worse so we will need to see blood sugar readings first.  If pain is not improving in the next few days, let me know and we potentially could do prednisone with close monitoring of your blood sugar.  Tylenol can also be taken with the muscle relaxant if needed.  Return to the clinic or go to the nearest emergency room if any of your symptoms worsen or new symptoms occur.   Cervical Radiculopathy  Cervical radiculopathy happens when a nerve in the neck (a cervical nerve) is pinched or bruised. This condition can happen because of an injury to the cervical spine (vertebrae) in the neck, or as part of the normal aging process. Pressure on the cervical nerves can cause pain or numbness that travels from the neck all the way down into the arm and fingers. Usually, this condition gets better with rest. Treatment may be needed if the condition does not improve. What are the causes? This condition may be caused by:  A neck injury.  A bulging (herniated) disk.  Muscle spasms.  Muscle tightness in the neck because of overuse.  Arthritis.  Breakdown or degeneration in the bones and joints of the spine (spondylosis) due to aging.  Bone spurs that may develop near the cervical nerves. What are the signs or symptoms? Symptoms of this condition include:  Pain. The pain may travel from the neck to the arm and hand. The pain can be severe or irritating. It may be worse when you  move your neck.  Numbness or tingling in your arm or hand.  Weakness in the affected arm and hand, in severe cases. How is this diagnosed? This condition may be diagnosed based on  your symptoms, your medical history, and a physical exam. You may also have tests, including:  X-rays.  A CT scan.  An MRI.  An electromyogram (EMG).  Nerve conduction tests. How is this treated? In many cases, treatment is not needed for this condition. With rest, the condition usually gets better over time. If treatment is needed, options may include:  Wearing a soft neck collar (cervical collar) for short periods of time, as told by your health care provider.  Doing physical therapy to strengthen your neck muscles.  Taking medicines, such as NSAIDs or oral corticosteroids.  Having spinal injections, in severe cases.  Having surgery. This may be needed if other treatments do not help. Different types of surgery may be done depending on the cause of this condition. Follow these instructions at home: If you have a cervical collar:  Wear it as told by your health care provider. Remove it only as told by your health care provider.  Ask your health care provider if you can remove the collar for cleaning and bathing. If you are allowed to remove the collar for cleaning or bathing: ? Follow instructions from your health care provider about how to remove the collar safely. ? Clean the collar by wiping it with mild soap and water and drying it completely. ? Take out any removable pads in the collar every 1-2 days, and wash them by hand with soap and water. Let them air-dry completely before you put them back in the collar. ? Check your skin under the collar for irritation or sores. If you see any, tell your health care provider. Managing pain  Take over-the-counter and prescription medicines only as told by your health care provider.  If directed, put ice on the affected area. ? If you have a soft neck collar, remove it as told by your health care provider. ? Put ice in a plastic bag. ? Place a towel between your skin and the bag. ? Leave the ice on for 20 minutes, 2-3 times a  day.  If applying ice does not help, you can try using heat. Use the heat source that your health care provider recommends, such as a moist heat pack or a heating pad. ? Place a towel between your skin and the heat source. ? Leave the heat on for 20-30 minutes. ? Remove the heat if your skin turns bright red. This is especially important if you are unable to feel pain, heat, or cold. You may have a greater risk of getting burned.  Try a gentle neck and shoulder massage to help relieve symptoms.      Activity  Rest as needed.  Return to your normal activities as told by your health care provider. Ask your health care provider what activities are safe for you.  Do stretching and strengthening exercises as told by your health care provider or physical therapist.  Do not lift anything that is heavier than 10 lb (4.5 kg) until your health care provider tells you that it is safe. General instructions  Use a flat pillow when you sleep.  Do not drive while wearing a cervical collar. If you do not have a cervical collar, ask your health care provider if it is safe to drive while your neck heals.  Ask your health  care provider if the medicine prescribed to you requires you to avoid driving or using heavy machinery.  Do not use any products that contain nicotine or tobacco, such as cigarettes, e-cigarettes, and chewing tobacco. These can delay healing. If you need help quitting, ask your health care provider.  Keep all follow-up visits as told by your health care provider. This is important. Contact a health care provider if:  Your condition does not improve with treatment. Get help right away if:  Your pain gets much worse and cannot be controlled with medicines.  You have weakness or numbness in your hand, arm, face, or leg.  You have a high fever.  You have a stiff, rigid neck.  You lose control of your bowels or your bladder (have incontinence).  You have trouble with walking,  balance, or speaking. Summary  Cervical radiculopathy happens when a nerve in the neck is pinched or bruised.  A nerve can get pinched from a bulging disk, arthritis, muscle spasms, or an injury to the neck.  Symptoms include pain, tingling, or numbness radiating from the neck into the arm or hand. Weakness can also occur in severe cases.  Treatment may include rest, wearing a cervical collar, and physical therapy. Medicines may be prescribed to help with pain. In severe cases, injections or surgery may be needed. This information is not intended to replace advice given to you by your health care provider. Make sure you discuss any questions you have with your health care provider. Document Revised: 03/23/2018 Document Reviewed: 03/23/2018 Elsevier Patient Education  2021 [employer].   If you have lab work done today you will be contacted with your lab results within the next 2 weeks.  If you have not heard from [place] then please contact us. The fastest way to get your results is to register for My Chart.   IF you received an x-ray today, you will receive an invoice from [facility]. Please contact [facility] at [phone] with questions or concerns regarding your invoice.   IF you received labwork today, you will receive an invoice from [facility]. Please contact [facility] at [phone] with questions or concerns regarding your invoice.   Our billing staff will not be able to assist you with questions regarding bills from these companies.  You will be contacted with the lab results as soon as they are available. The fastest way to get your results is to activate your My Chart account. Instructions are located on the last page of this paperwork. If you have not heard from [place] regarding the results in 2 weeks, please contact this office.         Signed, Merri Ray, MD [facility]

## 2020-06-09 ENCOUNTER — Ambulatory Visit: Payer: 59 | Admitting: Physical Therapy

## 2020-06-09 LAB — BASIC METABOLIC PANEL
BUN/Creatinine Ratio: 30 — ABNORMAL HIGH (ref 12–28)
BUN: 44 mg/dL — ABNORMAL HIGH (ref 8–27)
CO2: 26 mmol/L (ref 20–29)
Calcium: 9.9 mg/dL (ref 8.7–10.3)
Chloride: 107 mmol/L — ABNORMAL HIGH (ref 96–106)
Creatinine, Ser: 1.49 mg/dL — ABNORMAL HIGH (ref 0.57–1.00)
GFR calc Af Amer: 43 mL/min/{1.73_m2} — ABNORMAL LOW (ref >59)
GFR calc non Af Amer: 38 mL/min/{1.73_m2} — ABNORMAL LOW (ref >59)
Glucose: 63 mg/dL — ABNORMAL LOW (ref 65–99)
Potassium: 4.6 mmol/L (ref 3.5–5.2)
Sodium: 145 mmol/L — ABNORMAL HIGH (ref 134–144)

## 2020-06-09 LAB — FRUCTOSAMINE: Fructosamine: 319 umol/L — ABNORMAL HIGH (ref 0–285)

## 2020-06-10 ENCOUNTER — Other Ambulatory Visit (HOSPITAL_COMMUNITY): Payer: Self-pay | Admitting: Cardiology

## 2020-06-10 MED ORDER — SPIRONOLACTONE 25 MG PO TABS: 12.5000 mg | ORAL_TABLET | Freq: Every day | ORAL | 0 refills | Status: DC

## 2020-06-10 MED ORDER — TORSEMIDE 20 MG PO TABS: 80.0000 mg | ORAL_TABLET | Freq: Every day | ORAL | 0 refills | Status: DC

## 2020-06-10 NOTE — Telephone Encounter (Signed)
advisd pt must make and keep appt for additional refills Pharmacy team will make note on meds

## 2020-06-11 ENCOUNTER — Ambulatory Visit: Payer: Self-pay | Admitting: *Deleted

## 2020-06-11 NOTE — Telephone Encounter (Signed)
Needs to be seen tomorrow - any provider here or urgent care if not able to be seen here as reports of darkening/bruising. That was not seen on Monday at her visit.

## 2020-06-11 NOTE — Telephone Encounter (Signed)
OV 06/08/2020  Pt complaining of pain in her LT arm the same as it was the day of her visit, she was advised by nurse triage to take her prescribed pain medication and call back if no better. Should I advise anything else? Follow up OV next week?

## 2020-06-11 NOTE — Telephone Encounter (Signed)
C/o left arm pain and numbness from elbow to hand. Inside of forearm dark in color and looks bruised. Left arm sensitive to touch. C/o tingling in elbow. Reports same pain as on Tuesday when seen in office visit. Patient's caregiver reports patient has not taken pain medication prescribed at this time. Instructed patient to eat and take prescribed medication and if pain , numbness and tignling continue to call back. Denies numbness in face or leg. C/o some pain in left side of neck. Denies any other neurological deficits and unable to assess vision due to blindness. Care advise given. Patient and caregiver verbalized understanding of care advise and to call back or go to [facility] or ED if symptoms worsen.  Reason for Disposition . Pain is worsened or caused by bending the neck  Answer Assessment - Initial Assessment Questions 1. ONSET: "When did the pain start?"     today 2. LOCATION: "Where is the pain located?"     Left forearm  3. PAIN: "How bad is the pain?" (Scale 1-10; or mild, moderate, severe)   - MILD (1-3): doesn't interfere with normal activities   - MODERATE (4-7): interferes with normal activities (e.g., work or school) or awakens from sleep   - SEVERE (8-10): excruciating pain, unable to do any normal activities, unable to hold a cup of water     Moderate to severe 4. WORK OR EXERCISE: "Has there been any recent work or exercise that involved this part of the body?"     no 5. CAUSE: "What do you think is causing the arm pain?"     no 6. OTHER SYMPTOMS: "Do you have any other symptoms?" (e.g., neck pain, swelling, rash, fever, numbness, weakness)     Numbness, tingling in elbow , sensitive to touch 7. PREGNANCY: "Is there any chance you are pregnant?" "When was your last menstrual period?"     na  Protocols used: ARM PAIN-A-AH

## 2020-06-12 NOTE — Telephone Encounter (Signed)
Spoke with patient and appt has been schedule for Monday 06/15/20 with Dr Carlota Raspberry. Patient did not want to go tot the urgent care or Hospital. She will wait till appt and if she get any worse she will go to the Urgent care

## 2020-06-15 ENCOUNTER — Other Ambulatory Visit: Payer: Self-pay

## 2020-06-15 ENCOUNTER — Ambulatory Visit (INDEPENDENT_AMBULATORY_CARE_PROVIDER_SITE_OTHER): Payer: 59 | Admitting: Family Medicine

## 2020-06-15 ENCOUNTER — Ambulatory Visit
Admission: RE | Admit: 2020-06-15 | Discharge: 2020-06-15 | Disposition: A | Discharge status: Home / Self Care | Payer: 59 | Source: Ambulatory Visit | Attending: Family Medicine | Admitting: Family Medicine

## 2020-06-15 VITALS — BP 144/78 | HR 82 | Temp 96.8°F | Resp 16 | Ht 59.0 in | Wt 215.4 lb

## 2020-06-15 DIAGNOSIS — E118 Type 2 diabetes mellitus with unspecified complications: Secondary | ICD-10-CM | POA: Diagnosis not present

## 2020-06-15 DIAGNOSIS — M5412 Radiculopathy, cervical region: Secondary | ICD-10-CM | POA: Diagnosis not present

## 2020-06-15 DIAGNOSIS — M79602 Pain in left arm: Secondary | ICD-10-CM

## 2020-06-15 DIAGNOSIS — M542 Cervicalgia: Secondary | ICD-10-CM

## 2020-06-15 DIAGNOSIS — E162 Hypoglycemia, unspecified: Secondary | ICD-10-CM | POA: Diagnosis not present

## 2020-06-15 DIAGNOSIS — Z794 Long term (current) use of insulin: Secondary | ICD-10-CM

## 2020-06-15 LAB — GLUCOSE, POCT (MANUAL RESULT ENTRY): POC Glucose: 84 mg/dl (ref 70–99)

## 2020-06-15 MED ORDER — METHYLPREDNISOLONE ACETATE 80 MG/ML IJ SUSP
80.0000 mg | Freq: Once | INTRAMUSCULAR | Status: AC
  Administered 2020-06-15: 80 mg via INTRAMUSCULAR

## 2020-06-15 NOTE — Patient Instructions (Addendum)
Decrease tresiba to 50 units for now. If continued low readings - be seen right away. Try not to skip meals.   Increase the gabapentin to 1 pill twice per day for now as other pain meds are not an option. I will order xray and refer you to orthopedist  for your neck and arm pain. Ok to use heating pad to neck/upper back, or ice if it feels better.  Neck x-ray was ordered, can be performed at [address], [place] [facility]. We can try steroid injection. If nausea/vomiting from injection - be seen in emergency room.  Check blood sugars 3 times per day- injection today may make them increase.  If over 250 - let me know.  If pain is unrelieved or getting worse - be seen in emergency room.   Return to the clinic or go to the nearest emergency room if any of your symptoms worsen or new symptoms occur.   Cervical Radiculopathy  Cervical radiculopathy happens when a nerve in the neck (a cervical nerve) is pinched or bruised. This condition can happen because of an injury to the cervical spine (vertebrae) in the neck, or as part of the normal aging process. Pressure on the cervical nerves can cause pain or numbness that travels from the neck all the way down into the arm and fingers. Usually, this condition gets better with rest. Treatment may be needed if the condition does not improve. What are the causes? This condition may be caused by:  A neck injury.  A bulging (herniated) disk.  Muscle spasms.  Muscle tightness in the neck because of overuse.  Arthritis.  Breakdown or degeneration in the bones and joints of the spine (spondylosis) due to aging.  Bone spurs that may develop near the cervical nerves. What are the signs or symptoms? Symptoms of this condition include:  Pain. The pain may travel from the neck to the arm and hand. The pain can be severe or irritating. It may be worse when you move your neck.  Numbness or tingling in your arm or hand.  Weakness in the affected arm  and hand, in severe cases. How is this diagnosed? This condition may be diagnosed based on your symptoms, your medical history, and a physical exam. You may also have tests, including:  X-rays.  A CT scan.  An MRI.  An electromyogram (EMG).  Nerve conduction tests. How is this treated? In many cases, treatment is not needed for this condition. With rest, the condition usually gets better over time. If treatment is needed, options may include:  Wearing a soft neck collar (cervical collar) for short periods of time, as told by your health care provider.  Doing physical therapy to strengthen your neck muscles.  Taking medicines, such as NSAIDs or oral corticosteroids.  Having spinal injections, in severe cases.  Having surgery. This may be needed if other treatments do not help. Different types of surgery may be done depending on the cause of this condition. Follow these instructions at home: If you have a cervical collar:  Wear it as told by your health care provider. Remove it only as told by your health care provider.  Ask your health care provider if you can remove the collar for cleaning and bathing. If you are allowed to remove the collar for cleaning or bathing: ? Follow instructions from your health care provider about how to remove the collar safely. ? Clean the collar by wiping it with mild soap and water and drying  it completely. ? Take out any removable pads in the collar every 1-2 days, and wash them by hand with soap and water. Let them air-dry completely before you put them back in the collar. ? Check your skin under the collar for irritation or sores. If you see any, tell your health care provider. Managing pain  Take over-the-counter and prescription medicines only as told by your health care provider.  If directed, put ice on the affected area. ? If you have a soft neck collar, remove it as told by your health care provider. ? Put ice in a plastic bag. ? Place  a towel between your skin and the bag. ? Leave the ice on for 20 minutes, 2-3 times a day.  If applying ice does not help, you can try using heat. Use the heat source that your health care provider recommends, such as a moist heat pack or a heating pad. ? Place a towel between your skin and the heat source. ? Leave the heat on for 20-30 minutes. ? Remove the heat if your skin turns bright red. This is especially important if you are unable to feel pain, heat, or cold. You may have a greater risk of getting burned.  Try a gentle neck and shoulder massage to help relieve symptoms.      Activity  Rest as needed.  Return to your normal activities as told by your health care provider. Ask your health care provider what activities are safe for you.  Do stretching and strengthening exercises as told by your health care provider or physical therapist.  Do not lift anything that is heavier than 10 lb (4.5 kg) until your health care provider tells you that it is safe. General instructions  Use a flat pillow when you sleep.  Do not drive while wearing a cervical collar. If you do not have a cervical collar, ask your health care provider if it is safe to drive while your neck heals.  Ask your health care provider if the medicine prescribed to you requires you to avoid driving or using heavy machinery.  Do not use any products that contain nicotine or tobacco, such as cigarettes, e-cigarettes, and chewing tobacco. These can delay healing. If you need help quitting, ask your health care provider.  Keep all follow-up visits as told by your health care provider. This is important. Contact a health care provider if:  Your condition does not improve with treatment. Get help right away if:  Your pain gets much worse and cannot be controlled with medicines.  You have weakness or numbness in your hand, arm, face, or leg.  You have a high fever.  You have a stiff, rigid neck.  You lose control  of your bowels or your bladder (have incontinence).  You have trouble with walking, balance, or speaking. Summary  Cervical radiculopathy happens when a nerve in the neck is pinched or bruised.  A nerve can get pinched from a bulging disk, arthritis, muscle spasms, or an injury to the neck.  Symptoms include pain, tingling, or numbness radiating from the neck into the arm or hand. Weakness can also occur in severe cases.  Treatment may include rest, wearing a cervical collar, and physical therapy. Medicines may be prescribed to help with pain. In severe cases, injections or surgery may be needed. This information is not intended to replace advice given to you by your health care provider. Make sure you discuss any questions you have with your health care  provider. Document Revised: 03/23/2018 Document Reviewed: 03/23/2018 Elsevier Patient Education  2021 [place].  Hypoglycemia Hypoglycemia occurs when the level of sugar (glucose) in the blood is too low. Hypoglycemia can happen in people who have or do not have diabetes. It can develop quickly, and it can be a medical emergency. For most people, a blood glucose level below 70 mg/dL (3.9 mmol/L) is considered hypoglycemia. Glucose is a type of sugar that provides the body's main source of energy. Certain hormones (insulin and glucagon) control the level of glucose in the blood. Insulin lowers blood glucose, and glucagon raises blood glucose. Hypoglycemia can result from having too much insulin in the bloodstream, or from not eating enough food that contains glucose. You may also have reactive hypoglycemia, which happens within 4 hours after eating a meal. What are the causes? Hypoglycemia occurs most often in people who have diabetes and may be caused by:  Diabetes medicine.  Not eating enough, or not eating often enough.  Increased physical activity.  Drinking alcohol on an empty stomach. If you do not have diabetes, hypoglycemia  may be caused by:  A tumor in the pancreas.  Not eating enough, or not eating for long periods at a time (fasting).  A severe infection or illness.  Problems after having bariatric surgery.  Organ failure, such as kidney or liver failure.  Certain medicines. What increases the risk? Hypoglycemia is more likely to develop in people who:  Have diabetes and take medicines to lower blood glucose.  Abuse alcohol.  Have a severe illness. What are the signs or symptoms? Symptoms vary depending on whether the condition is mild, moderate, or severe. Mild hypoglycemia  Hunger.  Anxiety.  Sweating and feeling clammy.  Dizziness or feeling light-headed.  Sleepiness or restless sleep.  Nausea.  Increased heart rate.  Headache.  Blurry vision.  Irritability.  Tingling or numbness around the mouth, lips, or tongue.  A change in coordination. Moderate hypoglycemia  Confusion and poor judgment.  Behavior changes.  Weakness.  Irregular heartbeat. Severe hypoglycemia Severe hypoglycemia is a medical emergency. It can cause:  Fainting.  Seizures.  Loss of consciousness (coma).  Death. How is this diagnosed? Hypoglycemia is diagnosed with a blood test to measure your blood glucose level. This blood test is done while you are having symptoms. Your health care provider may also do a physical exam and review your medical history. How is this treated? This condition can be treated by immediately eating or drinking something that contains sugar with 15 grams of rapid-acting carbohydrate, such as:  4 oz (120 mL) of fruit juice.  4-6 oz (120-150 mL) of regular soda (not diet soda).  8 oz (240 mL) of low-fat milk.  Several pieces of hard candy. Check food labels to find out how many to eat for 15 grams.  1 Tbsp (15 mL) of sugar or honey. Treating hypoglycemia if you have diabetes If you are alert and able to swallow safely, follow the 15:15 rule:  Take 15 grams  of a rapid-acting carbohydrate. Talk with your health care provider about how much you should take. Options for getting 15 grams of rapid-acting carbohydrate include: ? Glucose tablets (take 4 tablets). ? Several pieces of hard candy. Check food labels to find out how many pieces to eat for 15 grams. ? 4 oz (120 mL) of fruit juice. ? 4-6 oz (120-150 mL) of regular (not diet) soda. ? 1 Tbsp (15 mL) of honey or sugar.  Check your blood  glucose 15 minutes after you take the carbohydrate.  If the repeat blood glucose level is still at or below 70 mg/dL (3.9 mmol/L), take 15 grams of a carbohydrate again.  If your blood glucose level does not increase above 70 mg/dL (3.9 mmol/L) after 3 tries, seek emergency medical care.  After your blood glucose level returns to normal, eat a meal or a snack within 1 hour.   Treating severe hypoglycemia Severe hypoglycemia is when your blood glucose level is at or below 54 mg/dL (3 mmol/L). Severe hypoglycemia is a medical emergency. Get medical help right away. If you have severe hypoglycemia and you cannot eat or drink, you will need to be given glucagon. A family member or close friend should learn how to check your blood glucose and how to give you glucagon. Ask your health care provider if you need to have an emergency glucagon kit available. Severe hypoglycemia may need to be treated in a hospital. The treatment may include getting glucose through an IV. You may also need treatment for the cause of your hypoglycemia. Follow these instructions at home: General instructions  Take over-the-counter and prescription medicines only as told by your health care provider.  Monitor your blood glucose as told by your health care provider.  If you drink alcohol: ? Limit how much you use to:  0-1 drink a day for nonpregnant women.  0-2 drinks a day for men. ? Be aware of how much alcohol is in your drink. In the [place] one drink equals one 12 oz bottle of beer  (355 mL), one 5 oz glass of wine (148 mL), or one 1 oz glass of hard liquor (44 mL).  Keep all follow-up visits as told by your health care provider. This is important. If you have diabetes:  Always have a rapid-acting carbohydrate (15 grams) option with you to treat low blood glucose.  Follow your diabetes management plan as directed. Make sure you: ? Know the symptoms of hypoglycemia. It is important to treat it right away to prevent it from becoming severe. ? Check your blood glucose as often as told. Always check before and after exercise. ? Always check your blood glucose before you drive a motorized vehicle. ? Take your medicines as told. ? Follow your meal plan. Eat on time, and do not skip meals.  Share your diabetes management plan with people in your workplace, school, and household.  Carry a medical alert card or wear medical alert jewelry.   Contact a health care provider if:  You have problems keeping your blood glucose in your target range.  You have frequent episodes of hypoglycemia. Get help right away if:  You continue to have hypoglycemia symptoms after eating or drinking something that contains 15 grams of fast-acting carbohydrate and you cannot get your blood glucose above 70 mg/dL (3.9 mmol/L) while following the 15:15 rule.  Your blood glucose is at or below 54 mg/dL (3 mmol/L).  You have a seizure.  You faint. These symptoms may represent a serious problem that is an emergency. Do not wait to see if the symptoms will go away. Get medical help right away. Call your local emergency services (911 in the [place] Do not drive yourself to the hospital. Summary  Hypoglycemia occurs when the level of sugar (glucose) in the blood is too low.  Hypoglycemia can happen in people who have or do not have diabetes. It can develop quickly, and it can be a medical emergency.  Make sure  you know the symptoms of hypoglycemia and how to treat it.  Always have a rapid-acting  carbohydrate snack with you to treat low blood sugar. This information is not intended to replace advice given to you by your health care provider. Make sure you discuss any questions you have with your health care provider. Document Revised: 03/27/2019 Document Reviewed: 03/27/2019 Elsevier Patient Education  2021 [employer].     If you have lab work done today you will be contacted with your lab results within the next 2 weeks.  If you have not heard from [place] then please contact us. The fastest way to get your results is to register for My Chart.   IF you received an x-ray today, you will receive an invoice from [facility]. Please contact [facility] at [phone] with questions or concerns regarding your invoice.   IF you received labwork today, you will receive an invoice from [facility]. Please contact [facility] at [phone] with questions or concerns regarding your invoice.   Our billing staff will not be able to assist you with questions regarding bills from these companies.  You will be contacted with the lab results as soon as they are available. The fastest way to get your results is to activate your My Chart account. Instructions are located on the last page of this paperwork. If you have not heard from [place] regarding the results in 2 weeks, please contact this office.

## 2020-06-15 NOTE — Progress Notes (Unsigned)
Subjective:  Patient ID: Amy [name], female    DOB: 1960/10/10  Age: 60 y.o. MRN: [id number]  CC:  Chief Complaint  Patient presents with  . Shoulder Pain    Pt reports pain from Lt shoulder down to elbow, notes some tingling in her forearm, pt also reports numbness when reaching in front of her. Started about 2 weeks ago. Pt aid mentioned shes got some bruising on her arm but minor     HPI Amy [name] presents for  Left shoulder/arm pain Initially evaluated January 24, 4 days of symptoms at that time without known injury.  Pain from shoulder blade, upper back, left arm at that time as well as lower neck area.  Reported possible swelling in the arm but significant change in circumference of left versus right at that visit.  No bruising or skin changes seen at that visit.  Possible spasm of the upper back musculature and differential included cervical radiculopathy/cervical source.  No known injury/trauma, imaging was initially deferred.  We also deferred prednisone given her diabetes and decreased control previously.  Started on Flexeril. Phone note reviewed from January 27.  Advised to be seen right away at that time including urgent care as reported darkening/bruising of that arm.  Declined urgent care/hospital eval.  Pain about the same since last visit. Taking flexeril tid, relaxes muscles, but when trying to raise arm and reaching feels tingling from upper shoulder/shoulder blade to hand 2nd/3rd fingers.  Numbness in left forearm, few bruises on forearm.  Tingling in back of neck down arm.  Trying to prop up arm at night.  No new swelling.  Difficulty with grip - hard to hold object. More pain/tingling in arm than weakness.   Hx of DM2, has been in 60's-70's. Feels shaky. No recent 200's. Last 200 over a week ago.  Taking 54 units tresiba per day.  Has had nausea and vomiting with prednisone in past - every time, has not been able to take.  humalog 8u qam, 12 at lunch, 14 u  at dinner.  No recent increase in meds, sometimes decreased intake- skip meals at times.no taking humalog if skips meals. Marland Kitchen Appetite improved after decreased appetite with Covid.  Endocrine appt 2/14.  On gapapentin for neuropathy 323m QHS.   Tylenol extra strength 4 per day.   History Patient Active Problem List   Diagnosis Date Noted  . 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 Pre-operative cardiovascular examination 02/18/2019  . Septic bursitis of elbow, right 10/30/2018  . CRI (chronic renal insufficiency), stage 3 (moderate) 07/26/2018  . Morbid obesity ([place] 07/26/2018  . Intractable nausea and vomiting 03/06/2018  . Right leg pain 12/19/2017  . Leukocytosis 12/19/2017  . Benign positional vertigo 12/19/2017  . Weakness 12/12/2017  . Laryngopharyngeal reflux (LPR) 10/19/2017  . Post-nasal drainage 10/19/2017  . Acute sinusitis 10/19/2017  . Degeneration of lumbar intervertebral disc 07/29/2017  . 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 Obstructive sleep apnea on CPAP 02/09/2017  . Chronic diastolic heart failure ([place] 11/08/2016  . Near syncope 01/30/2016  . SOB (shortness of breath)   . CAP (community acquired pneumonia) 09/12/2015  . Hypoxia 09/12/2015  . Essential hypertension 07/05/2015  . Hypotension 07/05/2015  . Diabetes mellitus with neurological manifestations ([facility] 11/04/2014  . Hyperlipidemia LDL goal <  70 11/04/2014  . Spinal stenosis, multilevel 11/04/2014  . Hyperkalemia 11/01/2014  . Hypoglycemia 08/09/2014  . Type 2 diabetes mellitus with complication, with long-term current use of insulin ([place]) 08/08/2014  . Elevated troponin 08/08/2014  . Nausea vomiting and diarrhea 08/08/2014  . 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 Dizziness 08/06/2012  . Orthostatic hypotension 08/06/2012  . Hypernatremia 08/06/2012  . Acute gastroenteritis 08/13/2011  . Gastroparesis 08/13/2011  . Low back pain 08/13/2011  . Oral thrush 08/13/2011  . Gastroenteritis 05/23/2011  . Dehydration 05/23/2011  . Blindness 05/23/2011  . DM type 1, not at goal, causing eye disease ([place]) 05/23/2011  . Asthma 05/23/2011  . Diarrhea 05/23/2011  . Vomiting 05/23/2011  . Hypothyroidism 05/23/2011  . Diabetic neuropathy ([place]) 05/23/2011  . Diabetic nephropathy ([place]) 05/23/2011   Past Medical History:  Diagnosis Date  . Arthritis   . Asthma   . Blind   . Blindness and low vision    left eye glass eye,  legally blind in right eye  . Diabetes mellitus   . Diabetic neuropathy ([place])   . Hyperlipidemia   . Hypertension   . Hypothyroidism   . Spinal stenosis    Past Surgical History:  Procedure Laterality Date  . ABDOMINAL HYSTERECTOMY  1990  . Panacea   x 2  . CHOLECYSTECTOMY  2008  . ENUCLEATION Bilateral 09/15/1998  . EYE SURGERY  2016   fitting artificial eye   Allergies  Allergen Reactions  . Penicillins Hives and Swelling    Has patient had a PCN reaction causing immediate rash, facial/tongue/throat swelling, SOB or lightheadedness with hypotension: yes- face swelling Has patient had a PCN reaction causing severe rash involving mucus membranes or skin necrosis: no Has patient had a PCN reaction that required hospitalization unknown (childhood allergy) Has patient had a PCN reaction occurring within the last 10 years: no If all of the above answers are "NO", then may proceed with Cephalosporin use.   . Tramadol Nausea And Vomiting    Intense nausea  . Vicodin [Hydrocodone-Acetaminophen] Nausea And Vomiting  . Codeine Nausea Only  . Iodine-131 Hives  . Iohexol Hives    Pt developed itching and hives along with nasal congestion; needs 13 hour premeds for future  studies, Onset Date: [id number]   . Lisinopril Cough  . Sulfa Antibiotics Nausea And Vomiting   Prior to Admission medications   Medication Sig Start Date End Date Taking? Authorizing Provider  albuterol (PROVENTIL HFA;VENTOLIN HFA) 108 (90 BASE) MCG/ACT inhaler Inhale 2 puffs into the lungs every 4 (four) hours as needed for wheezing or shortness of breath. For short   Yes [provider]  aspirin EC 81 MG tablet Take 81 mg by mouth daily.   Yes [provider]  B-D UF III MINI PEN NEEDLES 31G X 5 MM MISC U 1 PEN NEEDLE QID WITH EACH INJECTION 08/01/18  Yes [provider]  cetirizine (ZYRTEC) 10 MG tablet Take 10 mg by mouth daily.   Yes [provider]  Continuous Blood Gluc Receiver (DEXCOM G5 RECEIVER KIT) DEVI Test 1 time daily 05/11/20  Yes Wendie Agreste, MD  Continuous Blood Gluc Receiver (FREESTYLE LIBRE 2 READER) DEVI As instructed. iddm with complications - retinopathy, neuropathy, nephropathy. With device and sensors. 04/13/20  Yes Wendie Agreste, MD  cyclobenzaprine (FLEXERIL) 5 MG tablet 1 pill by mouth up to every 8 hours as needed. Start with one pill  by mouth each bedtime as needed due to sedation 06/08/20  Yes Wendie Agreste, MD  esomeprazole (NEXIUM) 40 MG capsule Take 40 mg by mouth daily.    Yes [provider]  gabapentin (NEURONTIN) 300 MG capsule Take 1 capsule (300 mg total) by mouth at bedtime. 05/21/20  Yes Wendie Agreste, MD  HUMALOG KWIKPEN 100 UNIT/ML KiwkPen Inject 8-14 Units into the skin See admin instructions. Inject 8 units in morning, 12 units at lunch, and 14 units at dinner 01/24/17  Yes [provider]  levothyroxine (SYNTHROID) 150 MCG tablet Take 1 tablet (150 mcg total) by mouth daily. Needs appt for future refills 04/30/20  Yes Bensimhon, Shaune Pascal, MD  linaclotide [facility]) 145 MCG CAPS capsule Take 145 mcg by mouth daily as needed (constipation).    Yes [provider]  naproxen  (NAPROSYN) 500 MG tablet Take 500 mg by mouth 2 (two) times daily with a meal.   Yes [provider]  ondansetron (ZOFRAN ODT) 4 MG disintegrating tablet Take 1 tablet (4 mg total) by mouth every 8 (eight) hours as needed for nausea or vomiting. 07/09/19  Yes Antonietta Breach, PA-C  potassium chloride SA (KLOR-CON) 20 MEQ tablet Take 1 tablet (20 mEq total) by mouth daily. Need appt for future refills 04/30/20  Yes Bensimhon, Shaune Pascal, MD  simvastatin (ZOCOR) 20 MG tablet Take 20 mg by mouth every evening.    Yes [provider]  spironolactone (ALDACTONE) 25 MG tablet Take 0.5 tablets (12.5 mg total) by mouth daily. Needs appt for future refills 06/10/20  Yes Larey Dresser, MD  torsemide (DEMADEX) 20 MG tablet Take 4 tablets (80 mg total) by mouth daily. Need appt for future refills 06/10/20  Yes Larey Dresser, MD  TRESIBA FLEXTOUCH 200 UNIT/ML FlexTouch Pen Inject 60-120 units at bed time per glucose reading. 05/06/20  Yes Wendie Agreste, MD   Social History   Socioeconomic History  . Marital status: Single    Spouse name: Not on file  . Number of children: 2  . Years of education: 58  . Highest education level: Not on file  Occupational History  . Occupation: N/A    Comment: disabled  Tobacco Use  . Smoking status: Former Smoker    Types: Cigarettes    Quit date: 05/22/1992    Years since quitting: 28.0  . Smokeless tobacco: Never Used  Vaping Use  . Vaping Use: Never used  Substance and Sexual Activity  . Alcohol use: No    Comment: quit 20 yrs ago  . Drug use: No  . Sexual activity: Not Currently  Other Topics Concern  . Not on file  Social History Narrative   Lives alone   caffeine drinks 2 cups of coffee a day, occasional soda    Social Determinants of Health   Financial Resource Strain: Medium Risk  . Difficulty of Paying Living Expenses: Somewhat hard  Food Insecurity: No Food Insecurity  . Worried About [name] in the Last Year: Never  true  . Ran Out of Food in the Last Year: Never true  Transportation Needs: Not on file  Physical Activity: Not on file  Stress: Not on file  Social Connections: Not on file  Intimate Partner Violence: Not on file    Review of Systems   Objective:   Vitals:   06/15/20 1052 06/15/20 1054  BP: (!) 159/84 (!) 144/78  Pulse: 82   Resp: 16   Temp: (!)  96.8 F (36 C)   TempSrc: Temporal   SpO2: 94%   Weight: 215 lb 6.4 oz (97.7 kg)   Height: '4\' 11"'  (1.499 m)      Physical Exam Constitutional:      General: She is not in acute distress.    Appearance: She is well-developed and well-nourished.  HENT:     Head: Normocephalic and atraumatic.  Cardiovascular:     Rate and Rhythm: Normal rate.  Pulmonary:     Effort: Pulmonary effort is normal.  Musculoskeletal:     Comments: C-spine: Lower paraspinals on the left with tenderness, slight midline tenderness lower.  Decreased extension, left greater than right rotation with discomfort into the left shoulder blade, down arm.  Somewhat guarded left shoulder range of motion but is able to flex and Abduct without assistance to approximately 100 degrees.  Slight discomfort along the areas over left forearm, left upper arm without focal bony tenderness.  No upper or lower arm swelling when compared to right.  No ecchymosis.  Skin is intact.  Cap refill less than 1 second fingertips.  Minimal decreased grip strength on left versus right with discomfort, grossly equal strength with resisted biceps, triceps testing.  Reflexes on the left 1+ bicep, 1+ brachioradialis, difficulty with obtaining tricep reflex, same as right.  1+ bicep, brachioradialis on the right.  Neurological:     Mental Status: She is alert and oriented to person, place, and time.  Psychiatric:        Mood and Affect: Mood and affect normal.     Results for orders placed or performed in visit on 06/15/20  POCT glucose (manual entry)  Result Value Ref Range   POC  Glucose 84 70 - 99 mg/dl  denies hypoglycemia sx's at this time.   DG Cervical Spine Complete  Result Date: 06/15/2020 CLINICAL DATA:  Left-sided neck pain worsening over the last several weeks and radiating to the left arm. EXAM: CERVICAL SPINE - COMPLETE 4+ VIEW COMPARISON:  CT 01/08/2019 FINDINGS: Solid bridging anterior osteophytes from C2 to T1. Bony foraminal stenosis at C2-3, C3-4, C4-5 and C5-6, worse on the right than the left. 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 No traumatic finding. IMPRESSION: Solid bridging osteophytes from C2 through C7. Upper cervical foraminal stenosis. Probable canal stenosis based on ossification of the posterior longitudinal ligament shown by the previous CT. Electronically Signed   By: Nelson Chimes M.D.   On: 06/15/2020 13:53     Assessment & Plan:  SHAMAYA KAUER is a 60 y.o. female . Neck pain on left side - Plan: DG Cervical Spine Complete, methylPREDNISolone acetate (DEPO-MEDROL) injection 80 mg, Ambulatory referral to Orthopedic Surgery Left arm pain - Plan: DG Cervical Spine Complete, methylPREDNISolone acetate (DEPO-MEDROL) injection 80 mg, Ambulatory referral to Orthopedic Surgery Cervical radiculopathy - Plan: DG Cervical Spine Complete, methylPREDNISolone acetate (DEPO-MEDROL) injection 80 mg, Ambulatory referral to Orthopedic Surgery  -Persistent symptoms after trial of muscle relaxant.  Still suspected cervical radiculopathy.  Do not appreciate any bruising, no hand vein swelling, unlikely upper extremity DVT.  C-spine imaging indicates degenerative changes, possible stenosis.  Could have combined picture with some rotator cuff syndrome as well with some discomfort at terminal range of motion.    -Initially deferred prednisone due to diabetes unknown control, but also reports nausea and vomiting with oral prednisone.  Limited on pain options and with her nephropathy avoiding NSAIDs.  Did agree to try a  Depo-Medrol  injection with ER/RTC precautions if nausea and vomiting ensues.  -Pain regimen also limited given her allergy profile.  Okay to continue Tylenol, will try higher dose of gabapentin for now  -Refer to orthopedics to evaluate for next options, decision on advanced imaging.  RTC precautions given.  Hypoglycemia - Plan: POCT glucose (manual entry) Type 2 diabetes mellitus with complication, with long-term current use of insulin (HCC)  -Again, difficult situation with uncontrolled diabetes with last A1c 9.9, elevated fructosamine last visit, but now experiencing hypoglycemia. will temporarily decrease her Tresiba to 50 units, continue mealtime insulin but avoid use if not eating.  Stressed importance of regular meals.  Discussed likely hyperglycemia with use of Depo-Medrol, close monitoring recommended, may need further med adjustments.  RTC precautions given.  Keep follow-up with endocrine as planned.  Meds ordered this encounter  Medications  . methylPREDNISolone acetate (DEPO-MEDROL) injection 80 mg   Patient Instructions    Decrease tresiba to 50 units for now. If continued low readings - be seen right away. Try not to skip meals.   Increase the gabapentin to 1 pill twice per day for now as other pain meds are not an option. I will order xray and refer you to orthopedist  for your neck and arm pain. Ok to use heating pad to neck/upper back, or ice if it feels better.  Neck x-ray was ordered, can be performed at [address], [place] [facility]. We can try steroid injection. If nausea/vomiting from injection - be seen in emergency room.  Check blood sugars 3 times per day- injection today may make them increase.  If over 250 - let me know.  If pain is unrelieved or getting worse - be seen in emergency room.   Return to the clinic or go to the nearest emergency room if any of your symptoms worsen or new symptoms occur.   Cervical Radiculopathy  Cervical radiculopathy happens  when a nerve in the neck (a cervical nerve) is pinched or bruised. This condition can happen because of an injury to the cervical spine (vertebrae) in the neck, or as part of the normal aging process. Pressure on the cervical nerves can cause pain or numbness that travels from the neck all the way down into the arm and fingers. Usually, this condition gets better with rest. Treatment may be needed if the condition does not improve. What are the causes? This condition may be caused by:  A neck injury.  A bulging (herniated) disk.  Muscle spasms.  Muscle tightness in the neck because of overuse.  Arthritis.  Breakdown or degeneration in the bones and joints of the spine (spondylosis) due to aging.  Bone spurs that may develop near the cervical nerves. What are the signs or symptoms? Symptoms of this condition include:  Pain. The pain may travel from the neck to the arm and hand. The pain can be severe or irritating. It may be worse when you move your neck.  Numbness or tingling in your arm or hand.  Weakness in the affected arm and hand, in severe cases. How is this diagnosed? This condition may be diagnosed based on your symptoms, your medical history, and a physical exam. You may also have tests, including:  X-rays.  A CT scan.  An MRI.  An electromyogram (EMG).  Nerve conduction tests. How is this treated? In many cases, treatment is not needed for this condition. With rest, the condition usually gets better over time. If treatment is needed, options may include:  Wearing a soft neck collar (cervical collar) for short periods of time, as told by your health care provider.  Doing physical therapy to strengthen your neck muscles.  Taking medicines, such as NSAIDs or oral corticosteroids.  Having spinal injections, in severe cases.  Having surgery. This may be needed if other treatments do not help. Different types of surgery may be done depending on the cause of this  condition. Follow these instructions at home: If you have a cervical collar:  Wear it as told by your health care provider. Remove it only as told by your health care provider.  Ask your health care provider if you can remove the collar for cleaning and bathing. If you are allowed to remove the collar for cleaning or bathing: ? Follow instructions from your health care provider about how to remove the collar safely. ? Clean the collar by wiping it with mild soap and water and drying it completely. ? Take out any removable pads in the collar every 1-2 days, and wash them by hand with soap and water. Let them air-dry completely before you put them back in the collar. ? Check your skin under the collar for irritation or sores. If you see any, tell your health care provider. Managing pain  Take over-the-counter and prescription medicines only as told by your health care provider.  If directed, put ice on the affected area. ? If you have a soft neck collar, remove it as told by your health care provider. ? Put ice in a plastic bag. ? Place a towel between your skin and the bag. ? Leave the ice on for 20 minutes, 2-3 times a day.  If applying ice does not help, you can try using heat. Use the heat source that your health care provider recommends, such as a moist heat pack or a heating pad. ? Place a towel between your skin and the heat source. ? Leave the heat on for 20-30 minutes. ? Remove the heat if your skin turns bright red. This is especially important if you are unable to feel pain, heat, or cold. You may have a greater risk of getting burned.  Try a gentle neck and shoulder massage to help relieve symptoms.      Activity  Rest as needed.  Return to your normal activities as told by your health care provider. Ask your health care provider what activities are safe for you.  Do stretching and strengthening exercises as told by your health care provider or physical therapist.  Do  not lift anything that is heavier than 10 lb (4.5 kg) until your health care provider tells you that it is safe. General instructions  Use a flat pillow when you sleep.  Do not drive while wearing a cervical collar. If you do not have a cervical collar, ask your health care provider if it is safe to drive while your neck heals.  Ask your health care provider if the medicine prescribed to you requires you to avoid driving or using heavy machinery.  Do not use any products that contain nicotine or tobacco, such as cigarettes, e-cigarettes, and chewing tobacco. These can delay healing. If you need help quitting, ask your health care provider.  Keep all follow-up visits as told by your health care provider. This is important. Contact a health care provider if:  Your condition does not improve with treatment. Get help right away if:  Your pain gets much worse and cannot be controlled with medicines.  You  have weakness or numbness in your hand, arm, face, or leg.  You have a high fever.  You have a stiff, rigid neck.  You lose control of your bowels or your bladder (have incontinence).  You have trouble with walking, balance, or speaking. Summary  Cervical radiculopathy happens when a nerve in the neck is pinched or bruised.  A nerve can get pinched from a bulging disk, arthritis, muscle spasms, or an injury to the neck.  Symptoms include pain, tingling, or numbness radiating from the neck into the arm or hand. Weakness can also occur in severe cases.  Treatment may include rest, wearing a cervical collar, and physical therapy. Medicines may be prescribed to help with pain. In severe cases, injections or surgery may be needed. This information is not intended to replace advice given to you by your health care provider. Make sure you discuss any questions you have with your health care provider. Document Revised: 03/23/2018 Document Reviewed: 03/23/2018 Elsevier Patient Education   2021 [place].  Hypoglycemia Hypoglycemia occurs when the level of sugar (glucose) in the blood is too low. Hypoglycemia can happen in people who have or do not have diabetes. It can develop quickly, and it can be a medical emergency. For most people, a blood glucose level below 70 mg/dL (3.9 mmol/L) is considered hypoglycemia. Glucose is a type of sugar that provides the body's main source of energy. Certain hormones (insulin and glucagon) control the level of glucose in the blood. Insulin lowers blood glucose, and glucagon raises blood glucose. Hypoglycemia can result from having too much insulin in the bloodstream, or from not eating enough food that contains glucose. You may also have reactive hypoglycemia, which happens within 4 hours after eating a meal. What are the causes? Hypoglycemia occurs most often in people who have diabetes and may be caused by:  Diabetes medicine.  Not eating enough, or not eating often enough.  Increased physical activity.  Drinking alcohol on an empty stomach. If you do not have diabetes, hypoglycemia may be caused by:  A tumor in the pancreas.  Not eating enough, or not eating for long periods at a time (fasting).  A severe infection or illness.  Problems after having bariatric surgery.  Organ failure, such as kidney or liver failure.  Certain medicines. What increases the risk? Hypoglycemia is more likely to develop in people who:  Have diabetes and take medicines to lower blood glucose.  Abuse alcohol.  Have a severe illness. What are the signs or symptoms? Symptoms vary depending on whether the condition is mild, moderate, or severe. Mild hypoglycemia  Hunger.  Anxiety.  Sweating and feeling clammy.  Dizziness or feeling light-headed.  Sleepiness or restless sleep.  Nausea.  Increased heart rate.  Headache.  Blurry vision.  Irritability.  Tingling or numbness around the mouth, lips, or tongue.  A change in  coordination. Moderate hypoglycemia  Confusion and poor judgment.  Behavior changes.  Weakness.  Irregular heartbeat. Severe hypoglycemia Severe hypoglycemia is a medical emergency. It can cause:  Fainting.  Seizures.  Loss of consciousness (coma).  Death. How is this diagnosed? Hypoglycemia is diagnosed with a blood test to measure your blood glucose level. This blood test is done while you are having symptoms. Your health care provider may also do a physical exam and review your medical history. How is this treated? This condition can be treated by immediately eating or drinking something that contains sugar with 15 grams of rapid-acting carbohydrate, such as:  4 oz (120 mL) of fruit juice.  4-6 oz (120-150 mL) of regular soda (not diet soda).  8 oz (240 mL) of low-fat milk.  Several pieces of hard candy. Check food labels to find out how many to eat for 15 grams.  1 Tbsp (15 mL) of sugar or honey. Treating hypoglycemia if you have diabetes If you are alert and able to swallow safely, follow the 15:15 rule:  Take 15 grams of a rapid-acting carbohydrate. Talk with your health care provider about how much you should take. Options for getting 15 grams of rapid-acting carbohydrate include: ? Glucose tablets (take 4 tablets). ? Several pieces of hard candy. Check food labels to find out how many pieces to eat for 15 grams. ? 4 oz (120 mL) of fruit juice. ? 4-6 oz (120-150 mL) of regular (not diet) soda. ? 1 Tbsp (15 mL) of honey or sugar.  Check your blood glucose 15 minutes after you take the carbohydrate.  If the repeat blood glucose level is still at or below 70 mg/dL (3.9 mmol/L), take 15 grams of a carbohydrate again.  If your blood glucose level does not increase above 70 mg/dL (3.9 mmol/L) after 3 tries, seek emergency medical care.  After your blood glucose level returns to normal, eat a meal or a snack within 1 hour.   Treating severe hypoglycemia Severe  hypoglycemia is when your blood glucose level is at or below 54 mg/dL (3 mmol/L). Severe hypoglycemia is a medical emergency. Get medical help right away. If you have severe hypoglycemia and you cannot eat or drink, you will need to be given glucagon. A family member or close friend should learn how to check your blood glucose and how to give you glucagon. Ask your health care provider if you need to have an emergency glucagon kit available. Severe hypoglycemia may need to be treated in a hospital. The treatment may include getting glucose through an IV. You may also need treatment for the cause of your hypoglycemia. Follow these instructions at home: General instructions  Take over-the-counter and prescription medicines only as told by your health care provider.  Monitor your blood glucose as told by your health care provider.  If you drink alcohol: ? Limit how much you use to:  0-1 drink a day for nonpregnant women.  0-2 drinks a day for men. ? Be aware of how much alcohol is in your drink. In the [place] one drink equals one 12 oz bottle of beer (355 mL), one 5 oz glass of wine (148 mL), or one 1 oz glass of hard liquor (44 mL).  Keep all follow-up visits as told by your health care provider. This is important. If you have diabetes:  Always have a rapid-acting carbohydrate (15 grams) option with you to treat low blood glucose.  Follow your diabetes management plan as directed. Make sure you: ? Know the symptoms of hypoglycemia. It is important to treat it right away to prevent it from becoming severe. ? Check your blood glucose as often as told. Always check before and after exercise. ? Always check your blood glucose before you drive a motorized vehicle. ? Take your medicines as told. ? Follow your meal plan. Eat on time, and do not skip meals.  Share your diabetes management plan with people in your workplace, school, and household.  Carry a medical alert card or wear medical  alert jewelry.   Contact a health care provider if:  You have problems keeping your  blood glucose in your target range.  You have frequent episodes of hypoglycemia. Get help right away if:  You continue to have hypoglycemia symptoms after eating or drinking something that contains 15 grams of fast-acting carbohydrate and you cannot get your blood glucose above 70 mg/dL (3.9 mmol/L) while following the 15:15 rule.  Your blood glucose is at or below 54 mg/dL (3 mmol/L).  You have a seizure.  You faint. These symptoms may represent a serious problem that is an emergency. Do not wait to see if the symptoms will go away. Get medical help right away. Call your local emergency services (911 in the [place] Do not drive yourself to the hospital. Summary  Hypoglycemia occurs when the level of sugar (glucose) in the blood is too low.  Hypoglycemia can happen in people who have or do not have diabetes. It can develop quickly, and it can be a medical emergency.  Make sure you know the symptoms of hypoglycemia and how to treat it.  Always have a rapid-acting carbohydrate snack with you to treat low blood sugar. This information is not intended to replace advice given to you by your health care provider. Make sure you discuss any questions you have with your health care provider. Document Revised: 03/27/2019 Document Reviewed: 03/27/2019 Elsevier Patient Education  2021 [employer].     If you have lab work done today you will be contacted with your lab results within the next 2 weeks.  If you have not heard from [place] then please contact us. The fastest way to get your results is to register for My Chart.   IF you received an x-ray today, you will receive an invoice from [facility]. Please contact [facility] at [phone] with questions or concerns regarding your invoice.   IF you received labwork today, you will receive an invoice from [facility]. Please contact [facility] at  [phone] with questions or concerns regarding your invoice.   Our billing staff will not be able to assist you with questions regarding bills from these companies.  You will be contacted with the lab results as soon as they are available. The fastest way to get your results is to activate your My Chart account. Instructions are located on the last page of this paperwork. If you have not heard from [place] regarding the results in 2 weeks, please contact this office.         Signed, Merri Ray, MD [facility]

## 2020-06-16 ENCOUNTER — Encounter: Payer: Self-pay | Admitting: Family Medicine

## 2020-06-23 ENCOUNTER — Other Ambulatory Visit: Payer: Self-pay

## 2020-06-23 ENCOUNTER — Encounter: Payer: Self-pay | Admitting: Family Medicine

## 2020-06-23 ENCOUNTER — Ambulatory Visit (INDEPENDENT_AMBULATORY_CARE_PROVIDER_SITE_OTHER): Payer: 59 | Admitting: Family Medicine

## 2020-06-23 DIAGNOSIS — M5412 Radiculopathy, cervical region: Secondary | ICD-10-CM | POA: Diagnosis not present

## 2020-06-23 DIAGNOSIS — H5440 Blindness, one eye, unspecified eye: Secondary | ICD-10-CM

## 2020-06-23 MED ORDER — GABAPENTIN 300 MG PO CAPS: 300.0000 mg | ORAL_CAPSULE | Freq: Three times a day (TID) | ORAL | 3 refills | Status: DC

## 2020-06-23 NOTE — Progress Notes (Signed)
Office Visit Note   [name]           Date of Birth: [birth year]           MRN: [name] Visit Date: 06/23/2020 Requested by: Wendie Agreste, MD [address] [place],  [place] [address] PCP: Wendie Agreste, MD  Subjective: Chief Complaint  Patient presents with  . Neck - Pain    Pain in the neck and into the left shoulder and arm x 1 month. NKI. Pain around the left shoulder blade.  . Left Shoulder - Pain    HPI: She is here with neck and left arm pain.  Symptoms started about a month ago, no injury.  She woke up 1 day with pain and it has not quit hurting since then.  Numbness and tingling in her hand, especially when looking upward.  She is right-hand dominant.  She has noticed a little bit of weakness.  She saw Dr. Carlota Raspberry who ordered x-rays which show significant spondylosis and autofusion.  He gave her gabapentin which has helped.  No previous problems with her neck that she can remember.  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  She has diabetes which has been fairly well controlled recently.                ROS:   All other systems were reviewed and are negative.  Objective: Vital Signs: There were no vitals taken for this visit.  Physical Exam:  General:  Alert and oriented, in no acute distress. Pulm:  Breathing unlabored. Psy:  Normal mood, congruent affect. Skin: No rash Neck: She has significant tender trigger points in the left rhomboid area.  She has pain with Spurling's test on the left.  Upper extremity strength and reflexes remain normal.    Imaging: No results found.  Assessment & Plan: 1.  Neck and left arm pain, suspect cervical radiculopathy. -We will try home health physical therapy.  Increased dosage of gabapentin.  If she fails to improve, consider MRI scan followed by epidural injection if indicated.     Procedures: No procedures performed        PMFS History: Patient Active Problem List   Diagnosis Date Noted  .  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 Pre-operative cardiovascular examination 02/18/2019  . Septic bursitis of elbow, right 10/30/2018  . CRI (chronic renal insufficiency), stage 3 (moderate) 07/26/2018  . Morbid obesity ([place]) 07/26/2018  . Intractable nausea and vomiting 03/06/2018  . Right leg pain 12/19/2017  . Leukocytosis 12/19/2017  . Benign positional vertigo 12/19/2017  . Weakness 12/12/2017  . Laryngopharyngeal reflux (LPR) 10/19/2017  . Post-nasal drainage 10/19/2017  . Acute sinusitis 10/19/2017  . Degeneration of lumbar intervertebral disc 07/29/2017  . 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 Obstructive sleep apnea on CPAP 02/09/2017  . Chronic diastolic heart failure ([place]) 11/08/2016  . Near syncope 01/30/2016  . SOB (shortness of breath)   . CAP (community acquired pneumonia) 09/12/2015  . Hypoxia 09/12/2015  . Essential hypertension 07/05/2015  . Hypotension 07/05/2015  . Diabetes mellitus with neurological manifestations ([place]) 11/04/2014  . Hyperlipidemia LDL goal <70 11/04/2014  . Spinal stenosis, multilevel 11/04/2014  . Hyperkalemia 11/01/2014  . Hypoglycemia 08/09/2014  . Type 2 diabetes mellitus with complication, with long-term current use  of insulin ([place]) 08/08/2014  . Elevated troponin 08/08/2014  . Nausea vomiting and diarrhea 08/08/2014  . 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 Dizziness 08/06/2012  . Orthostatic hypotension 08/06/2012  . Hypernatremia 08/06/2012  . Acute gastroenteritis 08/13/2011  . Gastroparesis 08/13/2011  . Low back pain 08/13/2011  . Oral thrush 08/13/2011  . Gastroenteritis 05/23/2011  . Dehydration 05/23/2011  . Blindness 05/23/2011  . DM type 1, not at  goal, causing eye disease ([place]) 05/23/2011  . Asthma 05/23/2011  . Diarrhea 05/23/2011  . Vomiting 05/23/2011  . Hypothyroidism 05/23/2011  . Diabetic neuropathy ([place]) 05/23/2011  . Diabetic nephropathy ([place]) 05/23/2011   Past Medical History:  Diagnosis Date  . Arthritis   . Asthma   . Blind   . Blindness and low vision    left eye glass eye,  legally blind in right eye  . Diabetes mellitus   . Diabetic neuropathy ([place])   . Hyperlipidemia   . Hypertension   . Hypothyroidism   . Spinal stenosis     Family History  Problem Relation Age of Onset  . Diabetes Sister   . Glaucoma Sister   . Hypertension Sister   . Healthy Mother   . Healthy Father   . Stroke Brother   . Heart attack Paternal Aunt   . Breast cancer Neg Hx     Past Surgical History:  Procedure Laterality Date  . ABDOMINAL HYSTERECTOMY  1990  . [place]   x 2  . CHOLECYSTECTOMY  2008  . ENUCLEATION Bilateral 09/15/1998  . EYE SURGERY  2016   fitting artificial eye   Social History   Occupational History  . Occupation: N/A    Comment: disabled  Tobacco Use  . Smoking status: Former Smoker    Types: Cigarettes    Quit date: 05/22/1992    Years since quitting: 28.1  . Smokeless tobacco: Never Used  Vaping Use  . Vaping Use: Never used  Substance and Sexual Activity  . Alcohol use: No    Comment: quit 20 yrs ago  . Drug use: No  . Sexual activity: Not Currently

## 2020-06-29 ENCOUNTER — Encounter: Payer: Self-pay | Admitting: Internal Medicine

## 2020-06-29 ENCOUNTER — Encounter: Payer: 59 | Attending: Internal Medicine | Admitting: Nutrition

## 2020-06-29 ENCOUNTER — Other Ambulatory Visit: Payer: Self-pay

## 2020-06-29 ENCOUNTER — Ambulatory Visit (INDEPENDENT_AMBULATORY_CARE_PROVIDER_SITE_OTHER): Payer: 59 | Admitting: Internal Medicine

## 2020-06-29 VITALS — BP 142/84 | HR 80 | Ht 59.0 in | Wt 208.5 lb

## 2020-06-29 DIAGNOSIS — T859XXS Unspecified complication of internal prosthetic device, implant and graft, sequela: Secondary | ICD-10-CM | POA: Diagnosis not present

## 2020-06-29 DIAGNOSIS — E1165 Type 2 diabetes mellitus with hyperglycemia: Secondary | ICD-10-CM | POA: Diagnosis not present

## 2020-06-29 DIAGNOSIS — E1039 Type 1 diabetes mellitus with other diabetic ophthalmic complication: Secondary | ICD-10-CM

## 2020-06-29 DIAGNOSIS — E1121 Type 2 diabetes mellitus with diabetic nephropathy: Secondary | ICD-10-CM | POA: Diagnosis not present

## 2020-06-29 DIAGNOSIS — E1142 Type 2 diabetes mellitus with diabetic polyneuropathy: Secondary | ICD-10-CM

## 2020-06-29 DIAGNOSIS — E11319 Type 2 diabetes mellitus with unspecified diabetic retinopathy without macular edema: Secondary | ICD-10-CM | POA: Insufficient documentation

## 2020-06-29 DIAGNOSIS — T859XXA Unspecified complication of internal prosthetic device, implant and graft, initial encounter: Secondary | ICD-10-CM | POA: Insufficient documentation

## 2020-06-29 DIAGNOSIS — Z794 Long term (current) use of insulin: Secondary | ICD-10-CM | POA: Insufficient documentation

## 2020-06-29 LAB — POCT GLUCOSE (DEVICE FOR HOME USE)
POC Glucose: 35 mg/dl — AB (ref 70–99)
POC Glucose: 70 mg/dl (ref 70–99)
POC Glucose: 72 mg/dl (ref 70–99)
POC Glucose: 83 mg/dl (ref 70–99)

## 2020-06-29 LAB — POCT GLYCOSYLATED HEMOGLOBIN (HGB A1C): Hemoglobin A1C: 7.6 % — AB (ref 4.0–5.6)

## 2020-06-29 MED ORDER — BD PEN NEEDLE MINI U/F 31G X 5 MM MISC: 1.0000 | Freq: Four times a day (QID) | 3 refills | Status: DC

## 2020-06-29 MED ORDER — TRULICITY 0.75 MG/0.5ML ~~LOC~~ SOAJ: 0.7500 mg | SUBCUTANEOUS | 1 refills | Status: DC

## 2020-06-29 NOTE — Patient Instructions (Signed)
-   Decrease Tresiba to 44 units daily  - Continue Humalog 8 units with Breakfast, 12 units with Lunch and 14 units with Supper  - Start Trulicity A999333 mg weekly        HOW TO TREAT LOW BLOOD SUGARS (Blood sugar LESS THAN 70 MG/DL)  Please follow the RULE OF 15 for the treatment of hypoglycemia treatment (when your (blood sugars are less than 70 mg/dL)    STEP 1: Take 15 grams of carbohydrates when your blood sugar is low, which includes:   3-4 GLUCOSE TABS  OR  3-4 OZ OF JUICE OR REGULAR SODA OR  ONE TUBE OF GLUCOSE GEL     STEP 2: RECHECK blood sugar in 15 MINUTES STEP 3: If your blood sugar is still low at the 15 minute recheck --> then, go back to STEP 1 and treat AGAIN with another 15 grams of carbohydrates.

## 2020-06-29 NOTE — Progress Notes (Addendum)
Name: Amy [name]  MRN/ DOB: [id number], 10-08-1960   Age/ Sex: 60 y.o., female    PCP: Wendie Agreste, MD   Reason for Endocrinology Evaluation: Type 2 Diabetes Mellitus     Date of Initial Endocrinology Visit: 06/29/2020     PATIENT IDENTIFIER: [name] is a 60 y.o. female with a past medical history of T2DM, Hypothyroidism and Dyslipidemia. The patient presented for initial endocrinology clinic visit on 06/29/2020 for consultative assistance with her diabetes management.    HPI: Ms. Blodgett accompanied by her sister [name]    Diagnosed with DM in her 20's  Prior Medications tried/Intolerance: not been on glycemic agents  Currently checking blood sugars occasionally  Hypoglycemia episodes : yes       Symptoms: yes        Frequency:   Hemoglobin A1c has ranged from 9.9% in 2021, peaking at 12.3% in 2014. Patient required assistance for hypoglycemia:  Patient has required hospitalization within the last 1 year from hyper or hypoglycemia:   In terms of diet, the patient eats 3 meals    HOME DIABETES REGIMEN: Tresiba 54 units daily  Humalog 8 units with Breakfast, 12 units Lunch and 14 units with supper    Statin: yes ACE-I/ARB: no      METER DOWNLOAD SUMMARY: Did not bring    DIABETIC COMPLICATIONS: Microvascular complications:   CKD III, neuropathy.  Left prosthetic eye. Blind right eye   Last eye exam: Completed 04/2020 ( Dr. Zadie Rhine)   Macrovascular complications:    Denies: CAD, PVD, CVA   PAST HISTORY: Past Medical History:  Past Medical History:  Diagnosis Date  . Arthritis   . Asthma   . Blind   . Blindness and low vision    left eye glass eye,  legally blind in right eye  . Diabetes mellitus   . Diabetic neuropathy ([place])   . Hyperlipidemia   . Hypertension   . Hypothyroidism   . Spinal stenosis    Past Surgical History:  Past Surgical History:  Procedure Laterality Date  . ABDOMINAL HYSTERECTOMY  1990  . [place]   x 2  . CHOLECYSTECTOMY  2008  . ENUCLEATION Bilateral 09/15/1998  . EYE SURGERY  2016   fitting artificial eye      Social History:  reports that she quit smoking about 28 years ago. Her smoking use included cigarettes. She has never used smokeless tobacco. She reports that she does not drink alcohol and does not use drugs. Family History:  Family History  Problem Relation Age of Onset  . Diabetes Sister   . Glaucoma Sister   . Hypertension Sister   . Healthy Mother   . Healthy Father   . Stroke Brother   . Heart attack Paternal Aunt   . Breast cancer Neg Hx      HOME MEDICATIONS: Allergies as of 06/29/2020      Reactions   Penicillins Hives, Swelling   Has patient had a PCN reaction causing immediate rash, facial/tongue/throat swelling, SOB or lightheadedness with hypotension: yes- face swelling Has patient had a PCN reaction causing severe rash involving mucus membranes or skin necrosis: no Has patient had a PCN reaction that required hospitalization unknown (childhood allergy) Has patient had a PCN reaction occurring within the last 10 years: no If all of the above answers are "NO", then may proceed with Cephalosporin use.   Tramadol Nausea And Vomiting   Intense nausea   Vicodin [  hydrocodone-acetaminophen] Nausea And Vomiting   Codeine Nausea Only   Iodine-131 Hives   Iohexol Hives   Pt developed itching and hives along with nasal congestion; needs 13 hour premeds for future studies, Onset Date: [id number]   Lisinopril Cough   Sulfa Antibiotics Nausea And Vomiting      Medication List       Accurate as of June 29, 2020  9:46 AM. If you have any questions, ask your nurse or doctor.        albuterol 108 (90 Base) MCG/ACT inhaler Commonly known as: VENTOLIN HFA Inhale 2 puffs into the lungs every 4 (four) hours as needed for wheezing or shortness of breath. For short   aspirin EC 81 MG tablet Take 81 mg by mouth daily.   B-D UF III MINI PEN  NEEDLES 31G X 5 MM Misc Generic drug: Insulin Pen Needle U 1 PEN NEEDLE QID WITH EACH INJECTION   cetirizine 10 MG tablet Commonly known as: ZYRTEC Take 10 mg by mouth daily.   cyclobenzaprine 5 MG tablet Commonly known as: FLEXERIL 1 pill by mouth up to every 8 hours as needed. Start with one pill by mouth each bedtime as needed due to sedation   esomeprazole 40 MG capsule Commonly known as: NEXIUM Take 40 mg by mouth daily.   FreeStyle [place] 2 Reader [place] As instructed. iddm with complications - retinopathy, neuropathy, nephropathy. With device and sensors. What changed: Another medication with the same name was removed. Continue taking this medication, and follow the directions you see here. Changed by: Dorita Sciara, MD   gabapentin 300 MG capsule Commonly known as: NEURONTIN Take 1 capsule (300 mg total) by mouth 3 (three) times daily.   HumaLOG KwikPen 100 UNIT/ML KwikPen Generic drug: insulin lispro Inject 8-14 Units into the skin See admin instructions. Inject 8 units in morning, 12 units at lunch, and 14 units at dinner   levothyroxine 150 MCG tablet Commonly known as: SYNTHROID Take 1 tablet (150 mcg total) by mouth daily. Needs appt for future refills   linaclotide 145 MCG Caps capsule Commonly known as: LINZESS Take 145 mcg by mouth daily as needed (constipation).   naproxen 500 MG tablet Commonly known as: NAPROSYN Take 500 mg by mouth 2 (two) times daily with a meal.   ondansetron 4 MG disintegrating tablet Commonly known as: Zofran ODT Take 1 tablet (4 mg total) by mouth every 8 (eight) hours as needed for nausea or vomiting.   potassium chloride SA 20 MEQ tablet Commonly known as: KLOR-CON Take 1 tablet (20 mEq total) by mouth daily. Need appt for future refills   simvastatin 20 MG tablet Commonly known as: ZOCOR Take 20 mg by mouth every evening.   spironolactone 25 MG tablet Commonly known as: ALDACTONE Take 0.5 tablets (12.5 mg total)  by mouth daily. Needs appt for future refills   torsemide 20 MG tablet Commonly known as: DEMADEX Take 4 tablets (80 mg total) by mouth daily. Need appt for future refills   Tresiba FlexTouch 200 UNIT/ML FlexTouch Pen Generic drug: insulin degludec Inject 60-120 units at bed time per glucose reading. What changed:   how much to take  how to take this  when to take this  additional instructions        ALLERGIES: Allergies  Allergen Reactions  . Penicillins Hives and Swelling    Has patient had a PCN reaction causing immediate rash, facial/tongue/throat swelling, SOB or lightheadedness with hypotension: yes- face swelling Has patient  had a PCN reaction causing severe rash involving mucus membranes or skin necrosis: no Has patient had a PCN reaction that required hospitalization unknown (childhood allergy) Has patient had a PCN reaction occurring within the last 10 years: no If all of the above answers are "NO", then may proceed with Cephalosporin use.   . Tramadol Nausea And Vomiting    Intense nausea  . Vicodin [Hydrocodone-Acetaminophen] Nausea And Vomiting  . Codeine Nausea Only  . Iodine-131 Hives  . Iohexol Hives    Pt developed itching and hives along with nasal congestion; needs 13 hour premeds for future studies, Onset Date: [id number]   . Lisinopril Cough  . Sulfa Antibiotics Nausea And Vomiting     REVIEW OF SYSTEMS: A comprehensive ROS was conducted with the patient and is negative except as per HPI and below:  Review of Systems  Gastrointestinal: Negative for nausea and vomiting.      OBJECTIVE:   VITAL SIGNS: BP (!) 142/84   Pulse 80   Ht '4\' 11"'$  (1.499 m)   Wt 208 lb 8 oz (94.6 kg)   SpO2 98%   BMI 42.11 kg/m    PHYSICAL EXAM:  General: Pt appears well and is in NAD  Neck: General: Supple without adenopathy or carotid bruits. Thyroid: Thyroid size normal.  No goiter or nodules appreciated. .  Lungs: Clear with good BS bilat with no rales,  rhonchi, or wheezes  Heart: RRR  Abdomen: Normoactive bowel sounds, soft, nontender, without masses or organomegaly palpable  Extremities:  Lower extremities - No pretibial edema. No lesions.  Neuro: MS is good with appropriate affect, pt is alert and Ox3     DATA REVIEWED:  Lab Results  Component Value Date   HGBA1C 7.6 (A) 06/29/2020   HGBA1C 9.9 (H) 04/13/2020   HGBA1C 10.7 (H) 11/02/2018   Lab Results  Component Value Date   MICROALBUR 9.74 (H) 10/30/2012   LDLCALC 66 04/13/2020   CREATININE 1.49 (H) 06/08/2020   Lab Results  Component Value Date   MICRALBCREAT 104.4 (H) 10/30/2012    Lab Results  Component Value Date   CHOL 126 04/13/2020   HDL 42 04/13/2020   LDLCALC 66 04/13/2020   TRIG 95 04/13/2020   CHOLHDL 3.0 04/13/2020        ASSESSMENT / PLAN / RECOMMENDATIONS:   1) Type 2 Diabetes Mellitus, Sub-Optmally controlled, With Neuropathic, retinopathic  and CKD III complications - Most recent A1c of 7.6 %. Goal A1c < 7.0 %.    - We emphasized the importance of low carb diet and avoiding sugar-sweetened beverages  - Will reduce basal insulin due to reports of fasting hypoglycemia . Of note the pt presented with a fasting BG of 35 mg/dL in the office, after multiple juice consumption, repeat BG's gradually increased to 82 mg/dL - She was trained on how to use Freestyle libre by our CDE today  - Barriers to self diabetes care if vision imapirment - We discussed starting Trulicity , no hx of pancreatitis.Cautioned against GI side effects   MEDICATIONS: - Decrease Tresiba to 44 units daily  - Continue Humalog 8 units with Breakfast, 12 units with Lunch and 14 units with Supper  - Start Trulicity A999333 mg weekly    EDUCATION / INSTRUCTIONS:  BG monitoring instructions: Patient is instructed to check her blood sugars 3 times a day, before meals .  Call [facility] if: BG persistently < 70  . I reviewed the Rule of 15 for the  treatment of  hypoglycemia in detail with the patient. Literature supplied.   2) Diabetic complications:   Eye: Does have known diabetic retinopathy.   Neuro/ Feet: Does have known diabetic peripheral neuropathy.  Renal: Patient does  have known baseline CKD. She is not on an ACEI/ARB at present.   3) Dyslipidemia: Patient is on Simvastatin . LDL at goal      F/U in 3 months     Signed electronically by: Mack Guise, MD  [facility]  [facility] [address] [place], [place] [address] Phone: [phone] FAX: [phone]   CC: Wendie Agreste, MD [address] [place] [id number] Phone: [phone]  Fax: [phone]    Return to Endocrinology clinic as below: Future Appointments  Date Time Provider [place]  07/02/2020 10:40 AM Wendie Agreste, MD [facility]  07/13/2020 10:00 AM Marzetta Board, DPM [facility]  08/20/2020 10:00 AM Michiel Cowboy, RN [facility] None  09/02/2020  9:00 AM Wendie Agreste, MD PCP-PCP [facility]  10/13/2020 11:30 AM Debbora Presto, NP [facility] None

## 2020-06-30 MED ORDER — HUMALOG KWIKPEN 100 UNIT/ML ~~LOC~~ SOPN: PEN_INJECTOR | SUBCUTANEOUS | 3 refills | Status: DC

## 2020-06-30 MED ORDER — TRESIBA FLEXTOUCH 200 UNIT/ML ~~LOC~~ SOPN: 44.0000 [IU] | PEN_INJECTOR | Freq: Every day | SUBCUTANEOUS | 3 refills | Status: DC

## 2020-06-30 NOTE — Progress Notes (Signed)
Pt. Is here with her sister and care giver,  She is totally bind.  They are here  to learn how to use the LIbre 2.  The app was downloaded to her phone.  The aide was shown how to insert the sensor, and how to scan the sensor to get her blood sugar reading.  They had no final questions.

## 2020-07-01 ENCOUNTER — Telehealth: Payer: Self-pay | Admitting: Internal Medicine

## 2020-07-01 MED ORDER — PEN NEEDLES 31G X 8 MM MISC: 3 refills | Status: DC

## 2020-07-01 NOTE — Telephone Encounter (Signed)
Marnette Burgess with [facility] requests to be called at ph# [phone] re: RX for [facility] received 06/30/20

## 2020-07-01 NOTE — Telephone Encounter (Signed)
Spoken to the pharmacy and gave the okay to dispense 90 days supply

## 2020-07-01 NOTE — Telephone Encounter (Signed)
Pharmacy called because pt returned the needles we prescribed and prefers the pen needles 31g 21m. Please [facility] for these needles [facility]:   [facility] Phone:  [phone] Fax:  [phone]

## 2020-07-01 NOTE — Patient Instructions (Signed)
Open app, and scan sensor at least once every 8 hours.   Call [place] help line if questions or if sensor falls off

## 2020-07-01 NOTE — Telephone Encounter (Signed)
Send Rx for pen needles 31 g x 8 mm as requested.

## 2020-07-02 ENCOUNTER — Other Ambulatory Visit: Payer: Self-pay

## 2020-07-02 ENCOUNTER — Encounter: Payer: Self-pay | Admitting: Family Medicine

## 2020-07-02 ENCOUNTER — Ambulatory Visit (INDEPENDENT_AMBULATORY_CARE_PROVIDER_SITE_OTHER): Payer: 59 | Admitting: Family Medicine

## 2020-07-02 VITALS — BP 108/71 | HR 73 | Temp 97.6°F | Ht 59.0 in | Wt 208.0 lb

## 2020-07-02 DIAGNOSIS — M5412 Radiculopathy, cervical region: Secondary | ICD-10-CM | POA: Diagnosis not present

## 2020-07-02 DIAGNOSIS — M542 Cervicalgia: Secondary | ICD-10-CM | POA: Diagnosis not present

## 2020-07-02 DIAGNOSIS — E162 Hypoglycemia, unspecified: Secondary | ICD-10-CM

## 2020-07-02 DIAGNOSIS — E118 Type 2 diabetes mellitus with unspecified complications: Secondary | ICD-10-CM | POA: Diagnosis not present

## 2020-07-02 DIAGNOSIS — Z794 Long term (current) use of insulin: Secondary | ICD-10-CM

## 2020-07-02 LAB — GLUCOSE, POCT (MANUAL RESULT ENTRY): POC Glucose: 88 mg/dl (ref 70–99)

## 2020-07-02 NOTE — Progress Notes (Signed)
Subjective:  Patient ID: Amy [name], female    DOB: Oct 28, 1960  Age: 60 y.o. MRN: [id number]  CC:  Chief Complaint  Patient presents with  . Follow-up    On Neck pain on the L side and diabetes. PT reports the gabapentin has helped a little, but pt stats the L arm has been tingling and some numbness. Pt reports she got her Libre BS monitor and states it keeps going off telling her the BS is low. Pt reports she takes her medication as directed.    HPI Amy [name] presents for   Left-sided neck pain with suspected cervical radiculopathy Last visit January 31. No relief with muscle relaxant. Suspected cervical radiculopathy. C-spine imaging indicated degenerative changes, possible stenosis. Also possible combination with rotator cuff syndrome, adhesive capsulitis based on shoulder exam. Some difficulty with medication options given her previous intolerances/allergies. Ultimately decided on Depo-Medrol injection at last visit, continued on Tylenol with higher dose of gabapentin and referred to [facility] Injection helped initially.less painful now.  Saw ortho last week. Taking gabapentin 3 times per day - min change.  Thinks is cervical cause. Tingling still down arm. Plans to follow up with ortho.    Type 2 diabetes with chronic use of insulin, hypoglycemia, hyperglycemia, diabetic nephropathy. Also discussed January 31. Difficult situation given her uncontrolled diabetes with A1c 9.9, elevated fructosamine on previous visit but hypoglycemia at last visit. Temporarily decrease her Tresiba to 50 units, continue mealtime insulin but avoid use of mealtime insulin if not eating normally. Regular meals discussed. Endocrinology eval with Dr.Shamleffer on February 14, hypoglycemia at that time initially at 35, improved to 82 in office.. Basal insulin was decreased to 44 units daily of Tresiba. Continued on Humalog 8 units, 12 units, 14 units with meals respectively.  Started on Trulicity 6.29 mg weekly. Home readings past few days still low - 46 at 1am, drank juice. Up to 57. Low again at 7am - 47. Ate some food with 8 units insulin. Low again after eating. Feeling fine now. Low on way here - 52. Took a sugar tablet.  No missed meals.    Results for orders placed or performed in visit on 07/02/20  POCT glucose (manual entry)  Result Value Ref Range   POC Glucose 88 70 - 99 mg/dl      History Patient Active Problem List   Diagnosis Date Noted  . Type 2 diabetes mellitus with diabetic polyneuropathy, with long-term current use of insulin ([place]) 06/29/2020  . 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 Type 2 diabetes mellitus with hyperglycemia, with long-term current use of insulin ([place]) 06/29/2020  . 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 Pre-operative cardiovascular examination 02/18/2019  . Septic bursitis of elbow, right 10/30/2018  . CRI (chronic renal insufficiency), stage 3 (moderate) 07/26/2018  . Morbid obesity ([place]) 07/26/2018  . Intractable nausea and vomiting 03/06/2018  . Right leg pain 12/19/2017  . Leukocytosis 12/19/2017  . Benign positional vertigo 12/19/2017  . Weakness 12/12/2017  . Laryngopharyngeal reflux (LPR) 10/19/2017  . Post-nasal drainage 10/19/2017  . Acute sinusitis 10/19/2017  . Degeneration of lumbar intervertebral disc 07/29/2017  . 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 Obstructive sleep apnea on CPAP 02/09/2017  . Chronic diastolic heart failure ([place]) 11/08/2016  . Near syncope 01/30/2016  . SOB (shortness of breath)   . CAP (community acquired pneumonia)  09/12/2015  . Hypoxia 09/12/2015  . Essential hypertension 07/05/2015  . Hypotension 07/05/2015  . Diabetes mellitus with neurological manifestations ([place]) 11/04/2014  . Hyperlipidemia LDL goal <70 11/04/2014  . Spinal stenosis, multilevel 11/04/2014  . Hyperkalemia 11/01/2014  . Hypoglycemia 08/09/2014  . Type 2 diabetes mellitus with complication, with long-term current use of insulin ([place]) 08/08/2014  . Elevated troponin 08/08/2014  . Nausea vomiting and diarrhea 08/08/2014  . 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 Dizziness 08/06/2012  . Orthostatic hypotension 08/06/2012  . Hypernatremia 08/06/2012  . Acute gastroenteritis 08/13/2011  . Gastroparesis 08/13/2011  . Low back pain 08/13/2011  . Oral thrush 08/13/2011  . Gastroenteritis 05/23/2011  . Dehydration 05/23/2011  . Blindness 05/23/2011  . DM type 1, not at goal, causing eye disease ([place]) 05/23/2011  . Asthma 05/23/2011  . Diarrhea 05/23/2011  . Vomiting 05/23/2011  . Hypothyroidism 05/23/2011  . Diabetic neuropathy ([place]) 05/23/2011  . Diabetic nephropathy ([place]) 05/23/2011   Past Medical History:  Diagnosis Date  . Arthritis   . Asthma   . Blind   . Blindness and low vision    left eye glass eye,  legally blind in right eye  . Diabetes mellitus   . Diabetic neuropathy ([place])   . Hyperlipidemia   . Hypertension   . Hypothyroidism   . Spinal stenosis    Past Surgical History:  Procedure Laterality Date  . ABDOMINAL HYSTERECTOMY  1990  . [place]   x 2  . CHOLECYSTECTOMY  2008  . ENUCLEATION Bilateral 09/15/1998  . EYE SURGERY  2016   fitting artificial eye   Allergies  Allergen Reactions  . Penicillins Hives and Swelling    Has patient had a PCN reaction causing immediate rash, facial/tongue/throat swelling, SOB or lightheadedness with hypotension: yes- face swelling Has patient had a PCN reaction causing severe rash involving mucus  membranes or skin necrosis: no Has patient had a PCN reaction that required hospitalization unknown (childhood allergy) Has patient had a PCN reaction occurring within the last 10 years: no If all of the above answers are "NO", then may proceed with Cephalosporin use.   . Tramadol Nausea And Vomiting    Intense nausea  . Vicodin [Hydrocodone-Acetaminophen] Nausea And Vomiting  . Codeine Nausea Only  . Iodine-131 Hives  . Iohexol Hives    Pt developed itching and hives along with nasal congestion; needs 13 hour premeds for future studies, Onset Date: [id number]   . Lisinopril Cough  . Sulfa Antibiotics Nausea And Vomiting   Prior to Admission medications   Medication Sig Start Date End Date Taking? Authorizing Provider  albuterol (PROVENTIL HFA;VENTOLIN HFA) 108 (90 BASE) MCG/ACT inhaler Inhale 2 puffs into the lungs every 4 (four) hours as needed for wheezing or shortness of breath. For short   Yes [provider]  aspirin EC 81 MG tablet Take 81 mg by mouth daily.   Yes [provider]  cetirizine (ZYRTEC) 10 MG tablet Take 10 mg by mouth daily.   Yes [provider]  Continuous Blood Gluc Receiver (FREESTYLE LIBRE 2 READER) DEVI As instructed. iddm with complications - retinopathy, neuropathy, nephropathy. With device and sensors. 04/13/20  Yes Wendie Agreste, MD  cyclobenzaprine (FLEXERIL) 5  MG tablet 1 pill by mouth up to every 8 hours as needed. Start with one pill by mouth each bedtime as needed due to sedation 06/08/20  Yes Wendie Agreste, MD  Dulaglutide (TRULICITY) 1.22 QM/2.5OI SOPN Inject 0.75 mg into the skin once a week. 06/29/20  Yes Shamleffer, Melanie Crazier, MD  esomeprazole (NEXIUM) 40 MG capsule Take 40 mg by mouth daily.    Yes [provider]  gabapentin (NEURONTIN) 300 MG capsule Take 1 capsule (300 mg total) by mouth 3 (three) times daily. 06/23/20  Yes Hilts, Legrand Como, MD  HUMALOG KWIKPEN 100 UNIT/ML KwikPen Inject 8 units in  morning, 12 units at lunch, and 14 units at dinner 06/30/20  Yes Shamleffer, Melanie Crazier, MD  Insulin Pen Needle (PEN NEEDLES) 31G X 8 MM MISC Inject 1 Device into the skin in the morning, at noon, in the evening, and at bedtime. 07/01/20  Yes Shamleffer, Melanie Crazier, MD  levothyroxine (SYNTHROID) 150 MCG tablet Take 1 tablet (150 mcg total) by mouth daily. Needs appt for future refills 04/30/20  Yes Bensimhon, Shaune Pascal, MD  linaclotide [facility]) 145 MCG CAPS capsule Take 145 mcg by mouth daily as needed (constipation).    Yes [provider]  naproxen (NAPROSYN) 500 MG tablet Take 500 mg by mouth 2 (two) times daily with a meal.   Yes [provider]  ondansetron (ZOFRAN ODT) 4 MG disintegrating tablet Take 1 tablet (4 mg total) by mouth every 8 (eight) hours as needed for nausea or vomiting. 07/09/19  Yes Antonietta Breach, PA-C  potassium chloride SA (KLOR-CON) 20 MEQ tablet Take 1 tablet (20 mEq total) by mouth daily. Need appt for future refills 04/30/20  Yes Bensimhon, Shaune Pascal, MD  simvastatin (ZOCOR) 20 MG tablet Take 20 mg by mouth every evening.    Yes [provider]  spironolactone (ALDACTONE) 25 MG tablet Take 0.5 tablets (12.5 mg total) by mouth daily. Needs appt for future refills 06/10/20  Yes Larey Dresser, MD  torsemide (DEMADEX) 20 MG tablet Take 4 tablets (80 mg total) by mouth daily. Need appt for future refills 06/10/20  Yes Larey Dresser, MD  TRESIBA FLEXTOUCH 200 UNIT/ML FlexTouch Pen Inject 44 Units into the skin daily. 06/30/20  Yes Shamleffer, Melanie Crazier, MD   Social History   Socioeconomic History  . Marital status: Single    Spouse name: Not on file  . Number of children: 2  . Years of education: 66  . Highest education level: Not on file  Occupational History  . Occupation: N/A    Comment: disabled  Tobacco Use  . Smoking status: Former Smoker    Types: Cigarettes    Quit date: 05/22/1992    Years since quitting: 28.1  .  Smokeless tobacco: Never Used  Vaping Use  . Vaping Use: Never used  Substance and Sexual Activity  . Alcohol use: No    Comment: quit 20 yrs ago  . Drug use: No  . Sexual activity: Not Currently  Other Topics Concern  . Not on file  Social History Narrative   Lives alone   caffeine drinks 2 cups of coffee a day, occasional soda    Social Determinants of Health   Financial Resource Strain: Medium Risk  . Difficulty of Paying Living Expenses: Somewhat hard  Food Insecurity: No Food Insecurity  . Worried About [name] in the Last Year: Never true  . Ran Out of Food in the Last Year: Never true  Transportation Needs: Not on file  Physical Activity: Not on file  Stress: Not on file  Social Connections: Not on file  Intimate Partner Violence: Not on file    Review of Systems Per HPI.   Objective:   Vitals:   07/02/20 1101  BP: 108/71  Pulse: 73  Temp: 97.6 F (36.4 C)  TempSrc: Temporal  SpO2: 96%  Weight: 208 lb (94.3 kg)  Height: _0  (1.499 m)     Physical Exam Vitals reviewed.  Constitutional:      Appearance: She is well-developed and well-nourished.  HENT:     Head: Normocephalic and atraumatic.  Eyes:     Extraocular Movements: EOM normal.     Conjunctiva/sclera: Conjunctivae normal.     Pupils: Pupils are equal, round, and reactive to light.  Neck:     Vascular: No carotid bruit.  Cardiovascular:     Rate and Rhythm: Normal rate and regular rhythm.     Pulses: Intact distal pulses.     Heart sounds: Normal heart sounds.  Pulmonary:     Effort: Pulmonary effort is normal.     Breath sounds: Normal breath sounds.  Abdominal:     Palpations: Abdomen is soft. There is no pulsatile mass.     Tenderness: There is no abdominal tenderness.  Musculoskeletal:     Comments:  Left arm/shoulder, less guarded with motion.  Equal grip strength with flexion, extension of the elbow as well as grip strength bilaterally.  Skin:    General: Skin is  warm and dry.  Neurological:     Mental Status: She is alert and oriented to person, place, and time.  Psychiatric:        Mood and Affect: Mood and affect normal.        Behavior: Behavior normal.      32 minutes spent during visit, greater than 50% counseling and assimilation of information, chart review, and discussion of plan.    Assessment & Plan:  Amy [name] is a 60 y.o. female . Type 2 diabetes mellitus with complication, with long-term current use of insulin ([place]) - Plan: POCT glucose (manual entry) Hypoglycemia  -Discussed with her endocrinologist.  We will lower Tyler Aas further as well as breakfast, lunch mealtime insulin.  Handout given hypoglycemia and further hypoglycemia precautions given.  Discussed with myself or endocrinology if that continues to occur with ER precautions given.  Neck pain on left side Cervical radiculopathy  -Improved after previous injection, and on gabapentin.  Continue follow-up with orthopedics.  No orders of the defined types were placed in this encounter.  Patient Instructions    Keep follow up with orthopaedics regarding your neck and arm. Still likley pinched nerve. Continue gabapentin for now. Call them or be seen if any worsening of symptoms.   Make sure to eat regular meals. Reduce tresiba to 40 units per day. Decreased morning meal  insulin to 6 units, decrease lunch to 10 units, and remain at 14 units with dinner. See low blood sugar precautions below. If you continue to have lows, make sure to contact Dr. Quin Hoop office, but let me know if I can help further.   Return to the clinic or go to the nearest emergency room if any of your symptoms worsen or new symptoms occur.    Hypoglycemia Hypoglycemia occurs when the level of sugar (glucose) in the blood is too low. Hypoglycemia can happen in people who have or do not have diabetes. It can develop quickly, and  it can be a medical emergency. For most people, a blood glucose  level below 70 mg/dL (3.9 mmol/L) is considered hypoglycemia. Glucose is a type of sugar that provides the body's main source of energy. Certain hormones (insulin and glucagon) control the level of glucose in the blood. Insulin lowers blood glucose, and glucagon raises blood glucose. Hypoglycemia can result from having too much insulin in the bloodstream, or from not eating enough food that contains glucose. You may also have reactive hypoglycemia, which happens within 4 hours after eating a meal. What are the causes? Hypoglycemia occurs most often in people who have diabetes and may be caused by:  Diabetes medicine.  Not eating enough, or not eating often enough.  Increased physical activity.  Drinking alcohol on an empty stomach. If you do not have diabetes, hypoglycemia may be caused by:  A tumor in the pancreas.  Not eating enough, or not eating for long periods at a time (fasting).  A severe infection or illness.  Problems after having bariatric surgery.  Organ failure, such as kidney or liver failure.  Certain medicines. What increases the risk? Hypoglycemia is more likely to develop in people who:  Have diabetes and take medicines to lower blood glucose.  Abuse alcohol.  Have a severe illness. What are the signs or symptoms? Symptoms vary depending on whether the condition is mild, moderate, or severe. Mild hypoglycemia  Hunger.  Anxiety.  Sweating and feeling clammy.  Dizziness or feeling light-headed.  Sleepiness or restless sleep.  Nausea.  Increased heart rate.  Headache.  Blurry vision.  Irritability.  Tingling or numbness around the mouth, lips, or tongue.  A change in coordination. Moderate hypoglycemia  Confusion and poor judgment.  Behavior changes.  Weakness.  Irregular heartbeat. Severe hypoglycemia Severe hypoglycemia is a medical emergency. It can cause:  Fainting.  Seizures.  Loss of consciousness (coma).  Death. How  is this diagnosed? Hypoglycemia is diagnosed with a blood test to measure your blood glucose level. This blood test is done while you are having symptoms. Your health care provider may also do a physical exam and review your medical history. How is this treated? This condition can be treated by immediately eating or drinking something that contains sugar with 15 grams of rapid-acting carbohydrate, such as:  4 oz (120 mL) of fruit juice.  4-6 oz (120-150 mL) of regular soda (not diet soda).  8 oz (240 mL) of low-fat milk.  Several pieces of hard candy. Check food labels to find out how many to eat for 15 grams.  1 Tbsp (15 mL) of sugar or honey. Treating hypoglycemia if you have diabetes If you are alert and able to swallow safely, follow the 15:15 rule:  Take 15 grams of a rapid-acting carbohydrate. Talk with your health care provider about how much you should take. Options for getting 15 grams of rapid-acting carbohydrate include: ? Glucose tablets (take 4 tablets). ? Several pieces of hard candy. Check food labels to find out how many pieces to eat for 15 grams. ? 4 oz (120 mL) of fruit juice. ? 4-6 oz (120-150 mL) of regular (not diet) soda. ? 1 Tbsp (15 mL) of honey or sugar.  Check your blood glucose 15 minutes after you take the carbohydrate.  If the repeat blood glucose level is still at or below 70 mg/dL (3.9 mmol/L), take 15 grams of a carbohydrate again.  If your blood glucose level does not increase above 70 mg/dL (3.9 mmol/L) after 3  tries, seek emergency medical care.  After your blood glucose level returns to normal, eat a meal or a snack within 1 hour.   Treating severe hypoglycemia Severe hypoglycemia is when your blood glucose level is at or below 54 mg/dL (3 mmol/L). Severe hypoglycemia is a medical emergency. Get medical help right away. If you have severe hypoglycemia and you cannot eat or drink, you will need to be given glucagon. A family member or close friend  should learn how to check your blood glucose and how to give you glucagon. Ask your health care provider if you need to have an emergency glucagon kit available. Severe hypoglycemia may need to be treated in a hospital. The treatment may include getting glucose through an IV. You may also need treatment for the cause of your hypoglycemia. Follow these instructions at home: General instructions  Take over-the-counter and prescription medicines only as told by your health care provider.  Monitor your blood glucose as told by your health care provider.  If you drink alcohol: ? Limit how much you use to:  0-1 drink a day for nonpregnant women.  0-2 drinks a day for men. ? Be aware of how much alcohol is in your drink. In the [place] one drink equals one 12 oz bottle of beer (355 mL), one 5 oz glass of wine (148 mL), or one 1 oz glass of hard liquor (44 mL).  Keep all follow-up visits as told by your health care provider. This is important. If you have diabetes:  Always have a rapid-acting carbohydrate (15 grams) option with you to treat low blood glucose.  Follow your diabetes management plan as directed. Make sure you: ? Know the symptoms of hypoglycemia. It is important to treat it right away to prevent it from becoming severe. ? Check your blood glucose as often as told. Always check before and after exercise. ? Always check your blood glucose before you drive a motorized vehicle. ? Take your medicines as told. ? Follow your meal plan. Eat on time, and do not skip meals.  Share your diabetes management plan with people in your workplace, school, and household.  Carry a medical alert card or wear medical alert jewelry.   Contact a health care provider if:  You have problems keeping your blood glucose in your target range.  You have frequent episodes of hypoglycemia. Get help right away if:  You continue to have hypoglycemia symptoms after eating or drinking something that contains  15 grams of fast-acting carbohydrate and you cannot get your blood glucose above 70 mg/dL (3.9 mmol/L) while following the 15:15 rule.  Your blood glucose is at or below 54 mg/dL (3 mmol/L).  You have a seizure.  You faint. These symptoms may represent a serious problem that is an emergency. Do not wait to see if the symptoms will go away. Get medical help right away. Call your local emergency services (911 in the [place] Do not drive yourself to the hospital. Summary  Hypoglycemia occurs when the level of sugar (glucose) in the blood is too low.  Hypoglycemia can happen in people who have or do not have diabetes. It can develop quickly, and it can be a medical emergency.  Make sure you know the symptoms of hypoglycemia and how to treat it.  Always have a rapid-acting carbohydrate snack with you to treat low blood sugar. This information is not intended to replace advice given to you by your health care provider. Make sure you discuss any  questions you have with your health care provider. Document Revised: 03/27/2019 Document Reviewed: 03/27/2019 Elsevier Patient Education  2021 [employer].    If you have lab work done today you will be contacted with your lab results within the next 2 weeks.  If you have not heard from [place] then please contact us. The fastest way to get your results is to register for My Chart.   IF you received an x-ray today, you will receive an invoice from [facility]. Please contact [facility] at [phone] with questions or concerns regarding your invoice.   IF you received labwork today, you will receive an invoice from [facility]. Please contact [facility] at [phone] with questions or concerns regarding your invoice.   Our billing staff will not be able to assist you with questions regarding bills from these companies.  You will be contacted with the lab results as soon as they are available. The fastest way to get your results is to  activate your My Chart account. Instructions are located on the last page of this paperwork. If you have not heard from [place] regarding the results in 2 weeks, please contact this office.         Signed, Merri Ray, MD [facility]

## 2020-07-02 NOTE — Patient Instructions (Addendum)
Keep follow up with orthopaedics regarding your neck and arm. Still likley pinched nerve. Continue gabapentin for now. Call them or be seen if any worsening of symptoms.   Make sure to eat regular meals. Reduce tresiba to 40 units per day. Decreased morning meal  insulin to 6 units, decrease lunch to 10 units, and remain at 14 units with dinner. See low blood sugar precautions below. If you continue to have lows, make sure to contact Dr. Quin Hoop office, but let me know if I can help further.   Return to the clinic or go to the nearest emergency room if any of your symptoms worsen or new symptoms occur.    Hypoglycemia Hypoglycemia occurs when the level of sugar (glucose) in the blood is too low. Hypoglycemia can happen in people who have or do not have diabetes. It can develop quickly, and it can be a medical emergency. For most people, a blood glucose level below 70 mg/dL (3.9 mmol/L) is considered hypoglycemia. Glucose is a type of sugar that provides the body's main source of energy. Certain hormones (insulin and glucagon) control the level of glucose in the blood. Insulin lowers blood glucose, and glucagon raises blood glucose. Hypoglycemia can result from having too much insulin in the bloodstream, or from not eating enough food that contains glucose. You may also have reactive hypoglycemia, which happens within 4 hours after eating a meal. What are the causes? Hypoglycemia occurs most often in people who have diabetes and may be caused by:  Diabetes medicine.  Not eating enough, or not eating often enough.  Increased physical activity.  Drinking alcohol on an empty stomach. If you do not have diabetes, hypoglycemia may be caused by:  A tumor in the pancreas.  Not eating enough, or not eating for long periods at a time (fasting).  A severe infection or illness.  Problems after having bariatric surgery.  Organ failure, such as kidney or liver failure.  Certain  medicines. What increases the risk? Hypoglycemia is more likely to develop in people who:  Have diabetes and take medicines to lower blood glucose.  Abuse alcohol.  Have a severe illness. What are the signs or symptoms? Symptoms vary depending on whether the condition is mild, moderate, or severe. Mild hypoglycemia  Hunger.  Anxiety.  Sweating and feeling clammy.  Dizziness or feeling light-headed.  Sleepiness or restless sleep.  Nausea.  Increased heart rate.  Headache.  Blurry vision.  Irritability.  Tingling or numbness around the mouth, lips, or tongue.  A change in coordination. Moderate hypoglycemia  Confusion and poor judgment.  Behavior changes.  Weakness.  Irregular heartbeat. Severe hypoglycemia Severe hypoglycemia is a medical emergency. It can cause:  Fainting.  Seizures.  Loss of consciousness (coma).  Death. How is this diagnosed? Hypoglycemia is diagnosed with a blood test to measure your blood glucose level. This blood test is done while you are having symptoms. Your health care provider may also do a physical exam and review your medical history. How is this treated? This condition can be treated by immediately eating or drinking something that contains sugar with 15 grams of rapid-acting carbohydrate, such as:  4 oz (120 mL) of fruit juice.  4-6 oz (120-150 mL) of regular soda (not diet soda).  8 oz (240 mL) of low-fat milk.  Several pieces of hard candy. Check food labels to find out how many to eat for 15 grams.  1 Tbsp (15 mL) of sugar or honey. Treating hypoglycemia if you  have diabetes If you are alert and able to swallow safely, follow the 15:15 rule:  Take 15 grams of a rapid-acting carbohydrate. Talk with your health care provider about how much you should take. Options for getting 15 grams of rapid-acting carbohydrate include: ? Glucose tablets (take 4 tablets). ? Several pieces of hard candy. Check food labels to  find out how many pieces to eat for 15 grams. ? 4 oz (120 mL) of fruit juice. ? 4-6 oz (120-150 mL) of regular (not diet) soda. ? 1 Tbsp (15 mL) of honey or sugar.  Check your blood glucose 15 minutes after you take the carbohydrate.  If the repeat blood glucose level is still at or below 70 mg/dL (3.9 mmol/L), take 15 grams of a carbohydrate again.  If your blood glucose level does not increase above 70 mg/dL (3.9 mmol/L) after 3 tries, seek emergency medical care.  After your blood glucose level returns to normal, eat a meal or a snack within 1 hour.   Treating severe hypoglycemia Severe hypoglycemia is when your blood glucose level is at or below 54 mg/dL (3 mmol/L). Severe hypoglycemia is a medical emergency. Get medical help right away. If you have severe hypoglycemia and you cannot eat or drink, you will need to be given glucagon. A family member or close friend should learn how to check your blood glucose and how to give you glucagon. Ask your health care provider if you need to have an emergency glucagon kit available. Severe hypoglycemia may need to be treated in a hospital. The treatment may include getting glucose through an IV. You may also need treatment for the cause of your hypoglycemia. Follow these instructions at home: General instructions  Take over-the-counter and prescription medicines only as told by your health care provider.  Monitor your blood glucose as told by your health care provider.  If you drink alcohol: ? Limit how much you use to:  0-1 drink a day for nonpregnant women.  0-2 drinks a day for men. ? Be aware of how much alcohol is in your drink. In the [place] one drink equals one 12 oz bottle of beer (355 mL), one 5 oz glass of wine (148 mL), or one 1 oz glass of hard liquor (44 mL).  Keep all follow-up visits as told by your health care provider. This is important. If you have diabetes:  Always have a rapid-acting carbohydrate (15 grams) option with  you to treat low blood glucose.  Follow your diabetes management plan as directed. Make sure you: ? Know the symptoms of hypoglycemia. It is important to treat it right away to prevent it from becoming severe. ? Check your blood glucose as often as told. Always check before and after exercise. ? Always check your blood glucose before you drive a motorized vehicle. ? Take your medicines as told. ? Follow your meal plan. Eat on time, and do not skip meals.  Share your diabetes management plan with people in your workplace, school, and household.  Carry a medical alert card or wear medical alert jewelry.   Contact a health care provider if:  You have problems keeping your blood glucose in your target range.  You have frequent episodes of hypoglycemia. Get help right away if:  You continue to have hypoglycemia symptoms after eating or drinking something that contains 15 grams of fast-acting carbohydrate and you cannot get your blood glucose above 70 mg/dL (3.9 mmol/L) while following the 15:15 rule.  Your blood glucose  is at or below 54 mg/dL (3 mmol/L).  You have a seizure.  You faint. These symptoms may represent a serious problem that is an emergency. Do not wait to see if the symptoms will go away. Get medical help right away. Call your local emergency services (911 in the [place] Do not drive yourself to the hospital. Summary  Hypoglycemia occurs when the level of sugar (glucose) in the blood is too low.  Hypoglycemia can happen in people who have or do not have diabetes. It can develop quickly, and it can be a medical emergency.  Make sure you know the symptoms of hypoglycemia and how to treat it.  Always have a rapid-acting carbohydrate snack with you to treat low blood sugar. This information is not intended to replace advice given to you by your health care provider. Make sure you discuss any questions you have with your health care provider. Document Revised: 03/27/2019  Document Reviewed: 03/27/2019 Elsevier Patient Education  2021 [employer].    If you have lab work done today you will be contacted with your lab results within the next 2 weeks.  If you have not heard from [place] then please contact us. The fastest way to get your results is to register for My Chart.   IF you received an x-ray today, you will receive an invoice from [facility]. Please contact [facility] at [phone] with questions or concerns regarding your invoice.   IF you received labwork today, you will receive an invoice from [facility]. Please contact [facility] at [phone] with questions or concerns regarding your invoice.   Our billing staff will not be able to assist you with questions regarding bills from these companies.  You will be contacted with the lab results as soon as they are available. The fastest way to get your results is to activate your My Chart account. Instructions are located on the last page of this paperwork. If you have not heard from [place] regarding the results in 2 weeks, please contact this office.

## 2020-07-06 ENCOUNTER — Other Ambulatory Visit (HOSPITAL_COMMUNITY): Payer: Self-pay | Admitting: Internal Medicine

## 2020-07-10 ENCOUNTER — Telehealth (HOSPITAL_COMMUNITY): Payer: Self-pay | Admitting: Vascular Surgery

## 2020-07-10 ENCOUNTER — Other Ambulatory Visit: Payer: Self-pay | Admitting: Family Medicine

## 2020-07-10 ENCOUNTER — Telehealth: Payer: Self-pay | Admitting: Cardiovascular Disease

## 2020-07-10 DIAGNOSIS — M5412 Radiculopathy, cervical region: Secondary | ICD-10-CM

## 2020-07-10 DIAGNOSIS — M62838 Other muscle spasm: Secondary | ICD-10-CM

## 2020-07-10 MED ORDER — ESOMEPRAZOLE MAGNESIUM 40 MG PO CPDR: 40.0000 mg | DELAYED_RELEASE_CAPSULE | Freq: Every day | ORAL | 0 refills | Status: AC

## 2020-07-10 MED ORDER — CYCLOBENZAPRINE HCL 5 MG PO TABS: ORAL_TABLET | ORAL | 0 refills | Status: DC

## 2020-07-10 MED ORDER — SIMVASTATIN 20 MG PO TABS: 20.0000 mg | ORAL_TABLET | Freq: Every evening | ORAL | 0 refills | Status: DC

## 2020-07-10 MED ORDER — TORSEMIDE 20 MG PO TABS: 80.0000 mg | ORAL_TABLET | Freq: Every day | ORAL | 0 refills | Status: DC

## 2020-07-10 MED ORDER — SPIRONOLACTONE 25 MG PO TABS: 12.5000 mg | ORAL_TABLET | Freq: Every day | ORAL | 0 refills | Status: DC

## 2020-07-10 NOTE — Telephone Encounter (Signed)
Medication Refill - Medication: cyclobenzaprine 5 mg  Has the patient contacted their pharmacy? Yes. [place] is calling  Preferred Pharmacy (with phone number or street name):[facility] [phone] phone number [phone] Agent: Please be advised that RX refills may take up to 3 business days. We ask that you follow-up with your pharmacy.

## 2020-07-10 NOTE — Telephone Encounter (Signed)
Pt c/o medication issue:  1. Name of Medication: torsemide (DEMADEX) 20 MG tablet; spironolactone (ALDACTONE) 25 MG tablet; simvastatin (ZOCOR) 20 MG tablet; esomeprazole (NEXIUM) 40 MG capsule  2. How are you currently taking this medication (dosage and times per day)? As written  3. Are you having a reaction (difficulty breathing--STAT)? No  4. What is your medication issue? Patient needs new prescriptions for medication listed above and is currently out of medication. Please fill prescriptions asap

## 2020-07-10 NOTE — Telephone Encounter (Signed)
Copied from [place] [id number]. Topic: Quick Communication - Rx Refill/Question >> Jul 10, 2020  1:04 PM Leward Quan A wrote: Medication: torsemide (DEMADEX) 20 MG tablet, spironolactone (ALDACTONE) 25 MG tablet, simvastatin (ZOCOR) 20 MG tablet,   Has the patient contacted their pharmacy? Yes.   (Agent: If no, request that the patient contact the pharmacy for the refill.) (Agent: If yes, when and what did the pharmacy advise?)  Preferred Pharmacy (with phone number or street name): [facility] [address]  Phone:  [phone] Fax:  [phone]     Agent: Please be advised that RX refills may take up to 3 business days. We ask that you follow-up with your pharmacy.

## 2020-07-10 NOTE — Telephone Encounter (Signed)
Left pt message to make f/u appt APP/NP / next ava

## 2020-07-10 NOTE — Telephone Encounter (Signed)
Requested medication (s) are due for refill today: yes  Requested medication (s) are on the active medication list:  yes  Last refill:  06/08/2020  Future visit scheduled: yes  Notes to clinic:  this refill cannot be delegated    Requested Prescriptions  Pending Prescriptions Disp Refills   cyclobenzaprine (FLEXERIL) 5 MG tablet 15 tablet 0    Sig: 1 pill by mouth up to every 8 hours as needed. Start with one pill by mouth each bedtime as needed due to sedation      There is no refill protocol information for this order

## 2020-07-13 ENCOUNTER — Encounter: Payer: Self-pay | Admitting: Podiatry

## 2020-07-13 ENCOUNTER — Other Ambulatory Visit: Payer: Self-pay

## 2020-07-13 ENCOUNTER — Ambulatory Visit (INDEPENDENT_AMBULATORY_CARE_PROVIDER_SITE_OTHER): Payer: 59 | Admitting: Podiatry

## 2020-07-13 DIAGNOSIS — E1142 Type 2 diabetes mellitus with diabetic polyneuropathy: Secondary | ICD-10-CM | POA: Diagnosis not present

## 2020-07-13 DIAGNOSIS — L84 Corns and callosities: Secondary | ICD-10-CM

## 2020-07-13 DIAGNOSIS — M79676 Pain in unspecified toe(s): Secondary | ICD-10-CM | POA: Diagnosis not present

## 2020-07-13 DIAGNOSIS — B351 Tinea unguium: Secondary | ICD-10-CM

## 2020-07-13 NOTE — Progress Notes (Signed)
Subjective:  Patient ID: Amy [name], female    DOB: [birth year],  MRN: [id number]  60 y.o. female presents with at risk foot care with history of diabetic neuropathy and painful callus(es) b/l heels and painful thick toenails that are difficult to trim. Painful toenails interfere with ambulation. Aggravating factors include wearing enclosed shoe gear. Pain is relieved with periodic professional debridement. Painful calluses are aggravated lying down, when weightbearing with and without shoegear. Pain is relieved with periodic professional debridement..    Her caregiver, Merwyn Katos, is present during the visit.  Amy [name] states her heels feel better and she has been using heel protectors daily.    She states blood glucose was 69 mg/dl this morning. She ate something to bring it up.   Review of Systems: Negative except as noted in the HPI.  Allergies  Allergen Reactions  . Penicillins Hives and Swelling    Has patient had a PCN reaction causing immediate rash, facial/tongue/throat swelling, SOB or lightheadedness with hypotension: yes- face swelling Has patient had a PCN reaction causing severe rash involving mucus membranes or skin necrosis: no Has patient had a PCN reaction that required hospitalization unknown (childhood allergy) Has patient had a PCN reaction occurring within the last 10 years: no If all of the above answers are "NO", then may proceed with Cephalosporin use.   . Tramadol Nausea And Vomiting    Intense nausea  . Vicodin [Hydrocodone-Acetaminophen] Nausea And Vomiting  . Codeine Nausea Only  . Iodine-131 Hives  . Iohexol Hives    Pt developed itching and hives along with nasal congestion; needs 13 hour premeds for future studies, Onset Date: [id number]   . Lisinopril Cough  . Sulfa Antibiotics Nausea And Vomiting   Social History   Tobacco Use  Smoking Status Former Smoker  . Types: Cigarettes  . Quit date: 05/22/1992  . Years since quitting: 28.1  Smokeless  Tobacco Never Used    Objective:  There were no vitals filed for this visit. Constitutional Patient is a pleasant 60 y.o. African American female morbidly obese in NAD. AAO x 3.  Vascular Capillary refill time to digits immediate b/l. Faintly palpable pedal pulses b/l. Pedal hair sparse. Lower extremity skin temperature gradient within normal limits. No pain with calf compression b/l. Trace edema noted b/l lower extremities. No ischemia or gangrene noted b/l lower extremities. No cyanosis or clubbing noted.  Neurologic Normal speech. Pt has subjective symptoms of neuropathy. Protective sensation intact 5/5 intact bilaterally with 10g monofilament b/l. Vibratory sensation intact b/l.  Dermatologic Pedal skin with normal turgor, texture and tone bilaterally. No open wounds bilaterally. No interdigital macerations bilaterally. Toenails 1-5 b/l elongated, discolored, dystrophic, thickened, crumbly with subungual debris and tenderness to dorsal palpation. Hyperkeratotic lesion(s) posteromedial aspect of heel left lower extremity and right lower extremity.  No erythema, no edema, no drainage, no fluctuance. Improved from last visit.  Orthopedic: Normal muscle strength 5/5 to all lower extremity muscle groups bilaterally. Hallux valgus with bunion deformity noted b/l lower extremities. Hammertoes noted to the 2-5 bilaterally.   Assessment:   1. Pain due to onychomycosis of toenail   2. Callus   3. Diabetic peripheral neuropathy associated with type 2 diabetes mellitus ([place])    Plan:  Patient was evaluated and treated and all questions answered.  Onychomycosis with pain -Nails palliatively debridement as below. -Educated on self-care  Procedure: Nail Debridement Rationale: Pain Type of Debridement: manual, sharp debridement. Instrumentation: Nail nipper, rotary burr. Number of Nails: 10  -Examined  patient. -Continue diabetic foot care principles. -Patient to continue soft, supportive shoe  gear daily. -Continue to wear heel protectors daily when in bed. -Toenails 1-5 b/l were debrided in length and girth with sterile nail nippers and dremel without iatrogenic bleeding.  -Callus(es) posteromedial aspect of heel left lower extremity and right lower extremity pared utilizing sterile scalpel blade without complication or incident. Total number debrided =2. -Patient to report any pedal injuries to medical professional immediately. -Patient/POA to call should there be question/concern in the interim.  Return in about 3 months (around 10/10/2020).  Marzetta Board, DPM

## 2020-07-14 ENCOUNTER — Encounter: Payer: 59 | Attending: Internal Medicine | Admitting: Nutrition

## 2020-07-14 ENCOUNTER — Telehealth: Payer: Self-pay | Admitting: Endocrinology

## 2020-07-14 DIAGNOSIS — E11649 Type 2 diabetes mellitus with hypoglycemia without coma: Secondary | ICD-10-CM | POA: Insufficient documentation

## 2020-07-14 NOTE — Progress Notes (Signed)
Patient is here today with her aid and sister.  She is complaining of "low blood sugars all the time." CGM download shows 16 lows in the last 7 days.  CGM linked to our practice also today.   Download put on DR. Kumar's desk for review.   Patient reports taking: Humalog:6u acB                10u acL                 14u ac S Tresiba: 40u HS Her blood sugar was 64 at 10:30AM and she was given 6 ounces of juice.  In 20, minutes, her blood sugar was 61.  Another juice and 6 peanut butter crackers given,  And told to wait 15 min.  When I went back to check on her in 156 minutes, they were gone.

## 2020-07-14 NOTE — Telephone Encounter (Signed)
Patient was told to reduce her Tresiba dose to 34u q HS.  She reverbalized this correctly, and had no final questions.

## 2020-07-14 NOTE — Telephone Encounter (Signed)
She has had low normal are low sugars overnight and randomly at other times, she needs to reduce her Tresiba to 34 units instead of 40

## 2020-07-17 IMAGING — DX DG FOOT COMPLETE 3+V*R*
3 series · 3 of 3 positions shown · non-contrast
Comparison: None.

CLINICAL DATA: Foot pain after trauma 3 days ago.

EXAM:
RIGHT FOOT COMPLETE - 3+ VIEW

[foot ap]
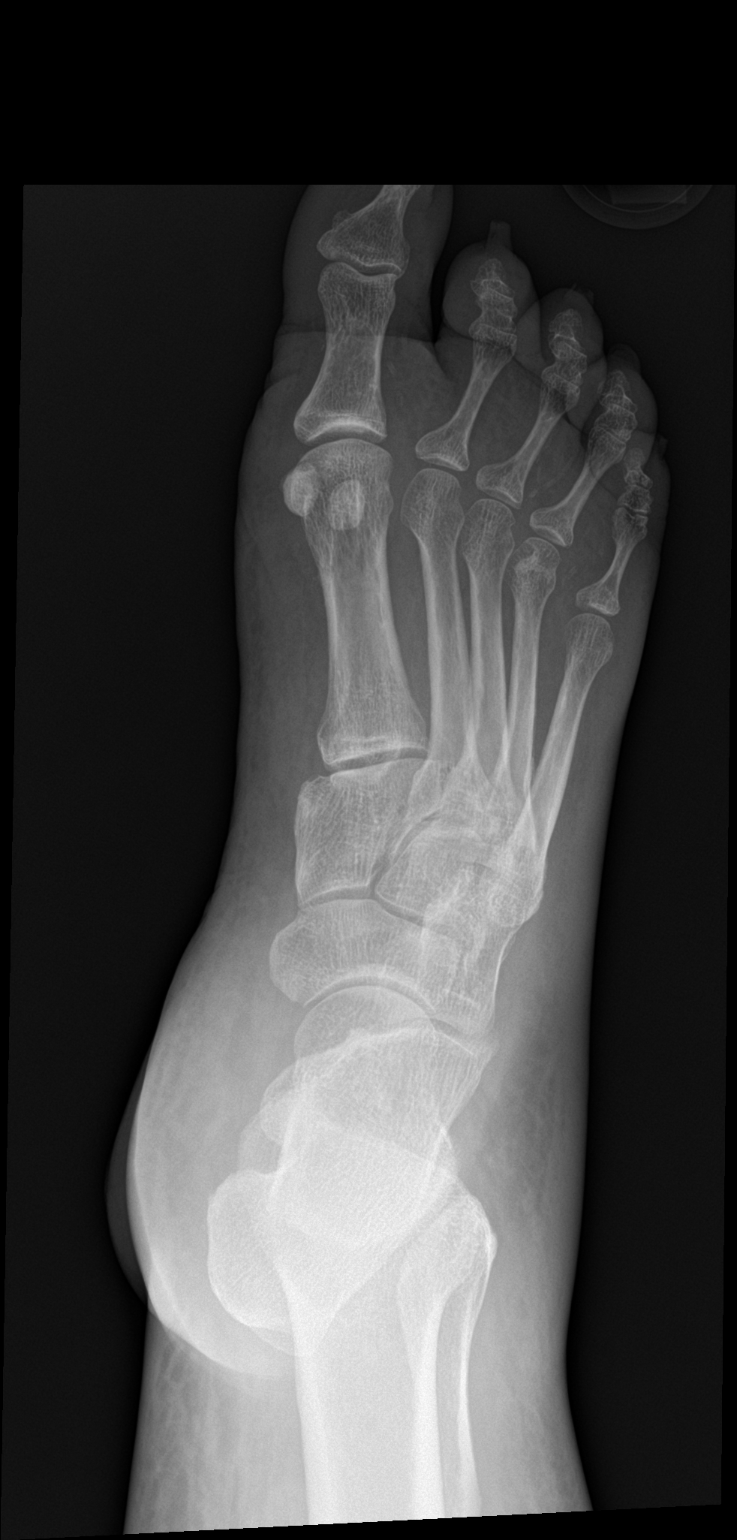

[foot obl]
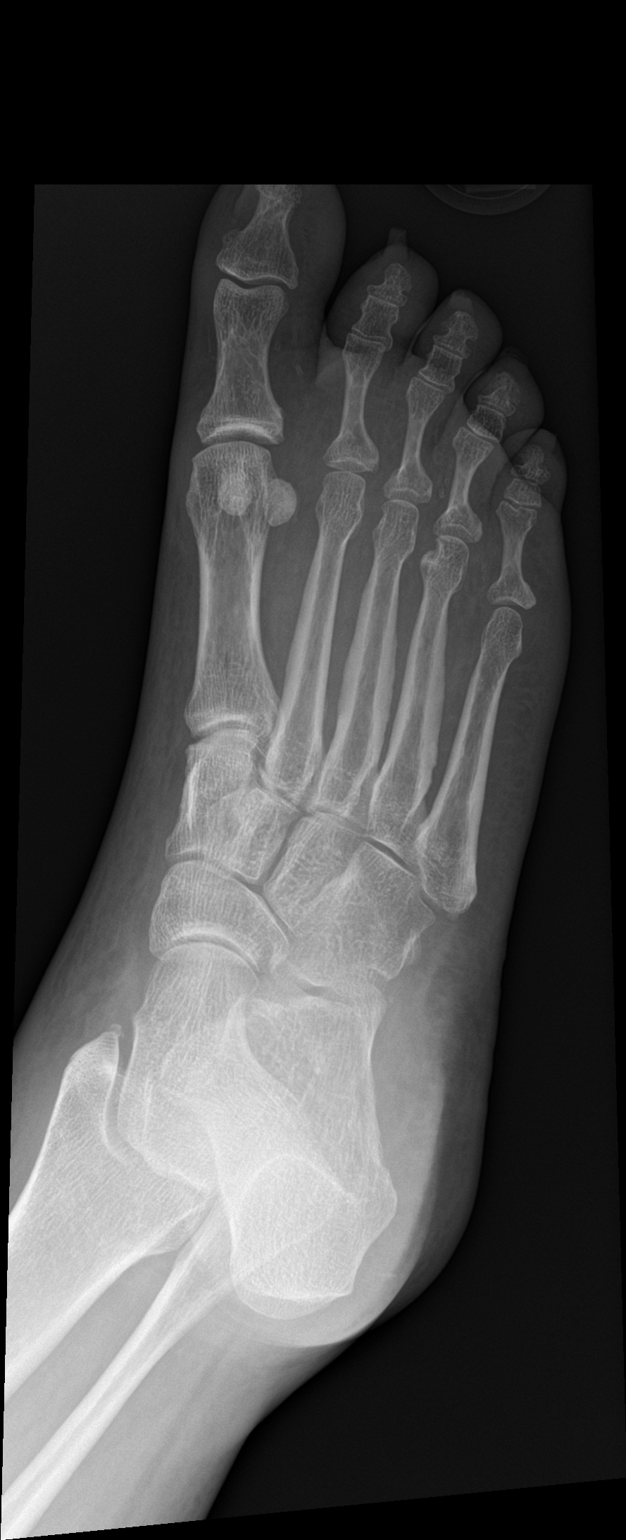

[foot lat]
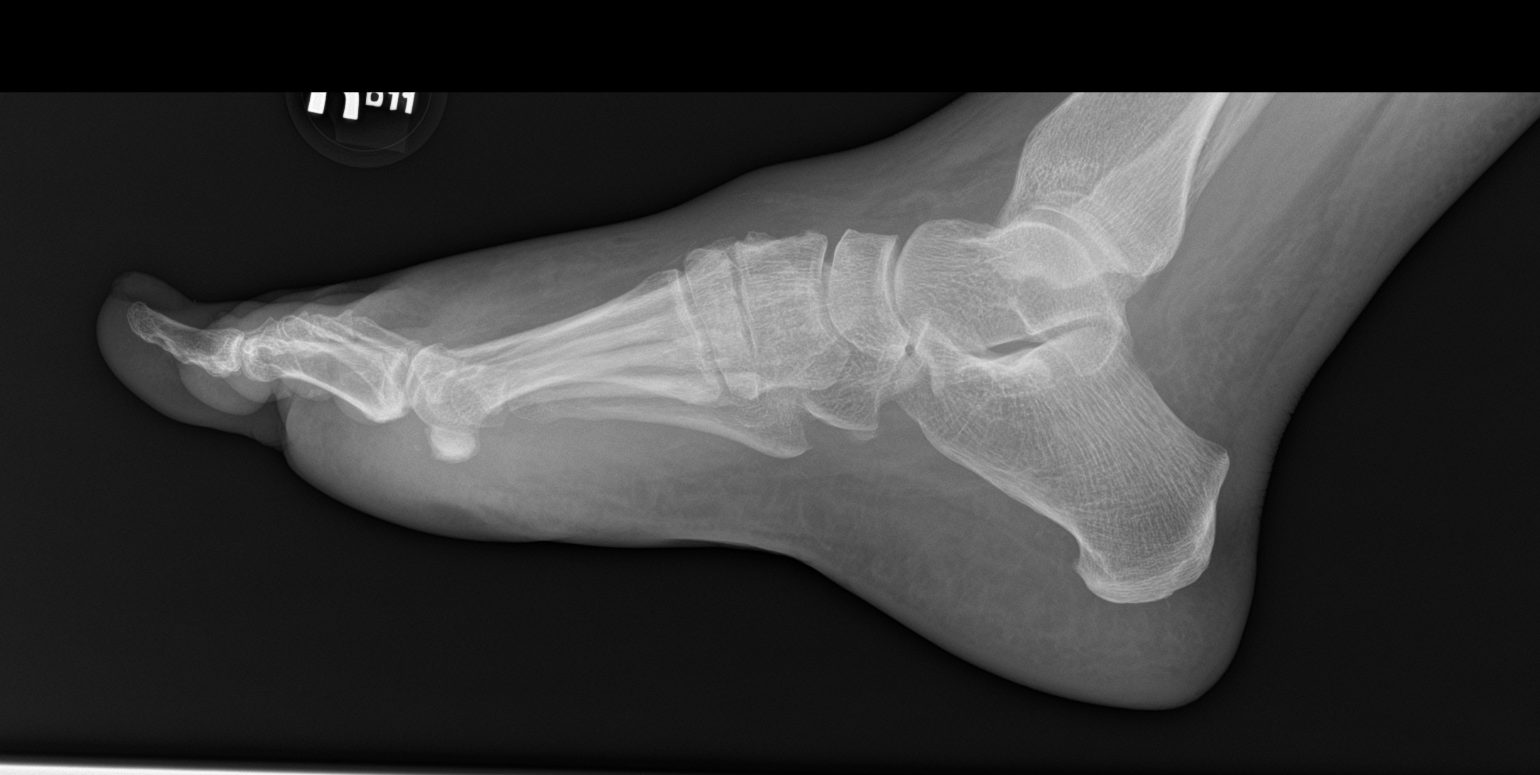

[3 of 3 positions shown; findings below may reference images not displayed]

FINDINGS: There is no evidence of fracture or dislocation. There is no
evidence of arthropathy or other focal bone abnormality. Soft
tissues are unremarkable.
IMPRESSION: Negative.

## 2020-07-26 ENCOUNTER — Other Ambulatory Visit (HOSPITAL_COMMUNITY): Payer: Self-pay | Admitting: Internal Medicine

## 2020-07-27 ENCOUNTER — Other Ambulatory Visit (HOSPITAL_COMMUNITY): Payer: Self-pay | Admitting: Family Medicine

## 2020-07-28 ENCOUNTER — Encounter (HOSPITAL_COMMUNITY): Payer: 59

## 2020-07-28 ENCOUNTER — Other Ambulatory Visit: Payer: Self-pay | Admitting: Family Medicine

## 2020-07-28 ENCOUNTER — Telehealth: Payer: Self-pay | Admitting: Nutrition

## 2020-07-28 NOTE — Telephone Encounter (Signed)
Requested medication (s) are due for refill today: Yes  Requested medication (s) are on the active medication list: No  Last refill:  Unsure  Future visit scheduled: No  Notes to clinic:  Unable to refill per protocol, not assigned to protocol, not on medication list     Requested Prescriptions  Pending Prescriptions Disp Refills   EMBRACE TALK GLUCOSE TEST test strip [Pharmacy Med Name: Embrace TALK test strips] 300 strip 3    Sig: USE TO [place] AT BEDTIME DX E11.29      Endocrinology: Diabetes - Testing Supplies Passed - 07/28/2020  5:14 PM      Passed - Valid encounter within last 12 months    Recent Outpatient Visits           3 weeks ago Type 2 diabetes mellitus with complication, with long-term current use of insulin ([place])   [facility], Ranell Patrick, MD   1 month ago Neck pain on left side   [facility], Ranell Patrick, MD   1 month ago Trapezius muscle spasm   [facility], Ranell Patrick, MD   2 months ago Type 2 diabetes mellitus with complication, with long-term current use of insulin [facility])   [facility], Ranell Patrick, MD   3 months ago Type 2 diabetes mellitus with complication, with long-term current use of insulin [facility])   [facility], Ranell Patrick, MD       Future Appointments             In 3 months Gildardo Pounds, NP [place]

## 2020-07-28 NOTE — Telephone Encounter (Signed)
Message left on my machine to look at her [place], because she is "Still having lows".   I called her and she reports a 58 today at 5AM.  She did not test using a finger stick.  She was advised to do this with every low blood sugar.  She also says she drops low acS several days/wk.  Today she was 88acL She is taking [place] 34u, and Humalog:  10/23/12.  Download put on Dr. Ronnie Derby desk.

## 2020-07-28 NOTE — Telephone Encounter (Signed)
Not my patient

## 2020-07-29 NOTE — Telephone Encounter (Signed)
Spoken to patient and notified Dr Shamleffer's comments. Verbalized understanding.   

## 2020-07-29 NOTE — Telephone Encounter (Signed)
PLease ask the pt to reduce Tresiba to 30 units daily (Down from 34) Decrease Humalog to 4 units with Breakfast, 8 units with Lunch but continue 14 units with supper     Thanks

## 2020-07-30 ENCOUNTER — Other Ambulatory Visit (HOSPITAL_COMMUNITY): Payer: Self-pay | Admitting: Family Medicine

## 2020-07-31 ENCOUNTER — Telehealth: Payer: Self-pay

## 2020-07-31 NOTE — Telephone Encounter (Signed)
Pharmacy called inquiring if the office had filed prior authorizations for embrace talk test strips per their fax request.

## 2020-08-05 ENCOUNTER — Other Ambulatory Visit: Payer: Self-pay

## 2020-08-05 ENCOUNTER — Ambulatory Visit (INDEPENDENT_AMBULATORY_CARE_PROVIDER_SITE_OTHER): Payer: 59 | Admitting: Family Medicine

## 2020-08-05 ENCOUNTER — Telehealth: Payer: Self-pay | Admitting: Family Medicine

## 2020-08-05 ENCOUNTER — Encounter: Payer: Self-pay | Admitting: Family Medicine

## 2020-08-05 VITALS — BP 129/81 | HR 81 | Temp 97.1°F | Ht 59.0 in | Wt 214.0 lb

## 2020-08-05 DIAGNOSIS — E118 Type 2 diabetes mellitus with unspecified complications: Secondary | ICD-10-CM

## 2020-08-05 DIAGNOSIS — E162 Hypoglycemia, unspecified: Secondary | ICD-10-CM | POA: Diagnosis not present

## 2020-08-05 DIAGNOSIS — Z794 Long term (current) use of insulin: Secondary | ICD-10-CM | POA: Diagnosis not present

## 2020-08-05 LAB — GLUCOSE, POCT (MANUAL RESULT ENTRY)
POC Glucose: 69 mg/dl — AB (ref 70–99)
POC Glucose: 80 mg/dl (ref 70–99)

## 2020-08-05 NOTE — Patient Instructions (Addendum)
[facility] Address: [address] [place], [place], [place] [address] Phone: [phone]   I will send a message to your endocrinologist but I am concerned about the frequent low blood sugars. Decrease tresiba to 30 units for now. Decrease mealtime insulin to 3 units with breakfast, 5 units with lunch and 7 units with dinner until I get more info form your endocrinologist. Having your aide verify amount of insulin that is drawn up may also be helpful. If those amounts are not what has been ordered - let me know so we can talk about med adjustments.   I would like to see more stable diabetes before I complete your camp form. I will call you in next week, so keep a record of your blood sugars (fasting, 2 hours after meals) to discuss on that call.   Return to the clinic or go to the nearest emergency room if any of your symptoms worsen or new symptoms occur.    Hypoglycemia Hypoglycemia occurs when the level of sugar (glucose) in the blood is too low. Hypoglycemia can happen in people who have or do not have diabetes. It can develop quickly, and it can be a medical emergency. For most people, a blood glucose level below 70 mg/dL (3.9 mmol/L) is considered hypoglycemia. Glucose is a type of sugar that provides the body's main source of energy. Certain hormones (insulin and glucagon) control the level of glucose in the blood. Insulin lowers blood glucose, and glucagon raises blood glucose. Hypoglycemia can result from having too much insulin in the bloodstream, or from not eating enough food that contains glucose. You may also have reactive hypoglycemia, which happens within 4 hours after eating a meal. What are the causes? Hypoglycemia occurs most often in people who have diabetes and may be caused by:  Diabetes medicine.  Not eating enough, or not eating often enough.  Increased physical activity.  Drinking alcohol on an empty stomach. If you do not have diabetes, hypoglycemia  may be caused by:  A tumor in the pancreas.  Not eating enough, or not eating for long periods at a time (fasting).  A severe infection or illness.  Problems after having bariatric surgery.  Organ failure, such as kidney or liver failure.  Certain medicines. What increases the risk? Hypoglycemia is more likely to develop in people who:  Have diabetes and take medicines to lower blood glucose.  Abuse alcohol.  Have a severe illness. What are the signs or symptoms? Symptoms vary depending on whether the condition is mild, moderate, or severe. Mild hypoglycemia  Hunger.  Anxiety.  Sweating and feeling clammy.  Dizziness or feeling light-headed.  Sleepiness or restless sleep.  Nausea.  Increased heart rate.  Headache.  Blurry vision.  Irritability.  Tingling or numbness around the mouth, lips, or tongue.  A change in coordination. Moderate hypoglycemia  Confusion and poor judgment.  Behavior changes.  Weakness.  Irregular heartbeat. Severe hypoglycemia Severe hypoglycemia is a medical emergency. It can cause:  Fainting.  Seizures.  Loss of consciousness (coma).  Death. How is this diagnosed? Hypoglycemia is diagnosed with a blood test to measure your blood glucose level. This blood test is done while you are having symptoms. Your health care provider may also do a physical exam and review your medical history. How is this treated? This condition can be treated by immediately eating or drinking something that contains sugar with 15 grams of rapid-acting carbohydrate, such as:  4 oz (120 mL) of fruit  juice.  4-6 oz (120-150 mL) of regular soda (not diet soda).  8 oz (240 mL) of low-fat milk.  Several pieces of hard candy. Check food labels to find out how many to eat for 15 grams.  1 Tbsp (15 mL) of sugar or honey. Treating hypoglycemia if you have diabetes If you are alert and able to swallow safely, follow the 15:15 rule:  Take 15 grams  of a rapid-acting carbohydrate. Talk with your health care provider about how much you should take. Options for getting 15 grams of rapid-acting carbohydrate include: ? Glucose tablets (take 4 tablets). ? Several pieces of hard candy. Check food labels to find out how many pieces to eat for 15 grams. ? 4 oz (120 mL) of fruit juice. ? 4-6 oz (120-150 mL) of regular (not diet) soda. ? 1 Tbsp (15 mL) of honey or sugar.  Check your blood glucose 15 minutes after you take the carbohydrate.  If the repeat blood glucose level is still at or below 70 mg/dL (3.9 mmol/L), take 15 grams of a carbohydrate again.  If your blood glucose level does not increase above 70 mg/dL (3.9 mmol/L) after 3 tries, seek emergency medical care.  After your blood glucose level returns to normal, eat a meal or a snack within 1 hour.   Treating severe hypoglycemia Severe hypoglycemia is when your blood glucose level is at or below 54 mg/dL (3 mmol/L). Severe hypoglycemia is a medical emergency. Get medical help right away. If you have severe hypoglycemia and you cannot eat or drink, you will need to be given glucagon. A family member or close friend should learn how to check your blood glucose and how to give you glucagon. Ask your health care provider if you need to have an emergency glucagon kit available. Severe hypoglycemia may need to be treated in a hospital. The treatment may include getting glucose through an IV. You may also need treatment for the cause of your hypoglycemia. Follow these instructions at home: General instructions  Take over-the-counter and prescription medicines only as told by your health care provider.  Monitor your blood glucose as told by your health care provider.  If you drink alcohol: ? Limit how much you use to:  0-1 drink a day for nonpregnant women.  0-2 drinks a day for men. ? Be aware of how much alcohol is in your drink. In the [place] one drink equals one 12 oz bottle of beer  (355 mL), one 5 oz glass of wine (148 mL), or one 1 oz glass of hard liquor (44 mL).  Keep all follow-up visits as told by your health care provider. This is important. If you have diabetes:  Always have a rapid-acting carbohydrate (15 grams) option with you to treat low blood glucose.  Follow your diabetes management plan as directed. Make sure you: ? Know the symptoms of hypoglycemia. It is important to treat it right away to prevent it from becoming severe. ? Check your blood glucose as often as told. Always check before and after exercise. ? Always check your blood glucose before you drive a motorized vehicle. ? Take your medicines as told. ? Follow your meal plan. Eat on time, and do not skip meals.  Share your diabetes management plan with people in your workplace, school, and household.  Carry a medical alert card or wear medical alert jewelry.   Contact a health care provider if:  You have problems keeping your blood glucose in your target range.  You have frequent episodes of hypoglycemia. Get help right away if:  You continue to have hypoglycemia symptoms after eating or drinking something that contains 15 grams of fast-acting carbohydrate and you cannot get your blood glucose above 70 mg/dL (3.9 mmol/L) while following the 15:15 rule.  Your blood glucose is at or below 54 mg/dL (3 mmol/L).  You have a seizure.  You faint. These symptoms may represent a serious problem that is an emergency. Do not wait to see if the symptoms will go away. Get medical help right away. Call your local emergency services (911 in the [place] Do not drive yourself to the hospital. Summary  Hypoglycemia occurs when the level of sugar (glucose) in the blood is too low.  Hypoglycemia can happen in people who have or do not have diabetes. It can develop quickly, and it can be a medical emergency.  Make sure you know the symptoms of hypoglycemia and how to treat it.  Always have a rapid-acting  carbohydrate snack with you to treat low blood sugar. This information is not intended to replace advice given to you by your health care provider. Make sure you discuss any questions you have with your health care provider. Document Revised: 03/27/2019 Document Reviewed: 03/27/2019 Elsevier Patient Education  2021 [employer].   If you have lab work done today you will be contacted with your lab results within the next 2 weeks.  If you have not heard from [place] then please contact us. The fastest way to get your results is to register for My Chart.   IF you received an x-ray today, you will receive an invoice from [facility]. Please contact [facility] at [phone] with questions or concerns regarding your invoice.   IF you received labwork today, you will receive an invoice from [facility]. Please contact [facility] at [phone] with questions or concerns regarding your invoice.   Our billing staff will not be able to assist you with questions regarding bills from these companies.  You will be contacted with the lab results as soon as they are available. The fastest way to get your results is to activate your My Chart account. Instructions are located on the last page of this paperwork. If you have not heard from [place] regarding the results in 2 weeks, please contact this office.

## 2020-08-05 NOTE — Telephone Encounter (Signed)
Patient called to inform provider that [employer] (formerly [employer] faxed requisition for patient to receive disposable underwear. Please complete and fax back to them as soon as possible.   Please advise patient at [phone] if you need any additional information.

## 2020-08-05 NOTE — Telephone Encounter (Signed)
Medication Refill - Medication: Simvastatin, potassium, levothyroxine, spironolactone, torsemide, cetrizine  Has the patient contacted their pharmacy? Yes.  Pharmacy calling to have these refilled. Please advise.  (Agent: If no, request that the patient contact the pharmacy for the refill.) (Agent: If yes, when and what did the pharmacy advise?)  Preferred Pharmacy (with phone number or street name):  [place], [address] [address]  Phone: [phone] Fax: [phone]  Hours: Not open 24 hours     Agent: Please be advised that RX refills may take up to 3 business days. We ask that you follow-up with your pharmacy.

## 2020-08-05 NOTE — Telephone Encounter (Signed)
Have you done the PA on this

## 2020-08-05 NOTE — Progress Notes (Signed)
Subjective:  Patient ID: Amy [name], female    DOB: 04/18/1961  Age: 60 y.o. MRN: [id number]  CC:  Chief Complaint  Patient presents with  . paper work    Pt is here to be cleared to go to a camp for the visually impaired [employer].   . Diabetes    PT is concerned about her libra devise it keeps sounding off that her BS is low. Pt has taken glucose tabs to bring it up and a while later the alarm will sound again that it is low.. pt reports not feeling like the BS is low.    HPI Amy [name] presents for   Presents for paperwork to be completed for camp.  Also with concern of hypoglycemia. Plan for [facility] (camp for blind and visually impaired). June 26-July 1st. Went there few years ago. Fishing, bowling, riding on a boat. She [place] not swim in lake. Application due in May.   Here with aide - [place].   Diabetes: With history of hyperglycemia, hypoglycemia, nephropathy, chronic use of insulin.  Difficult situation in the past with uncontrolled diabetes and episodes of hypoglycemia.  I have decreased her insulin dosage previously but also referred her to endocrinology.  As of her last visit she was seen by endocrinology on February 14.  Her basal insulin was decreased to 44 units of Tresiba per day, continued on Humalog 8 units, 12 units, 14 units with meals respectively.  Was started on Trulicity 4.85 mg weekly.  She was continued to have hypoglycemic episodes in the 40s and 50s.  I discussed with her endocrinologist, and decrease her Tyler Aas further to 40 units/day, decrease to morning meal insulin to 6 units, lunch insulin to 10 units, and remained at 14 units with dinner.  Telephone notes reviewed, March 1 with Dr. Dwyane Dee, Tyler Aas decreased to 34 units. Uses the embrace talk glucose monitor with history of blindness.  Does need test strips, chart reviewed, currently this has required a prior authorization.  Unfortunately today she reports that she is still having low blood  sugar readings, alerted by her monitor then treats with glucose tablets.  Her freestyle libre alarmed and told her reading was 56 at 1am and 6am - "always says 56".  At 1am - drank small soda and took glucose tablet. No repeat test on Embrace monitor at 1am  At 6am - alarmed again - 56, ate breakfast at 7am. Took 6 units of humalog and then checked with embrace monitor after breakfast reading of 90. Taking 34u tresiba at 11pm Breakfast 6 units humalog Lunch 10u humalog  Dinner 14u humalog.  Has aide 8:30am - 2:30pm.  No skipped meals. Eating BF, lunch, dinner, and snacks in between.  Eating glucose tablets and apple juice- has taken over 40 glucose tabs in past week.  Highest reading in past few weeks - 240 after drinking soda - drinks soda if out of apple juice. Most of the time in low 100 or below 100.  No n/v/abd pain. Has been losing weight.   Wt Readings from Last 3 Encounters:  08/05/20 214 lb (97.1 kg)  07/02/20 208 lb (94.3 kg)  06/29/20 208 lb 8 oz (94.6 kg)   Weight 223 on 05/21/20. Eating better - aide reports better diet since her care over past 2 months. More water, less soda. Walking at home.   12:43 PM Alert by her [place] reading in office at this time - 54. States she feels  fine.  immediate in office POC glucose:69 she took one glucose tab (4 g gulose). 12:43 PM -not feeling well - fatigue. Responsive to questioning. 15g glucose gel given.  12:43 PM Glucose 80 - feeling much better.    Lab Results  Component Value Date   HGBA1C 7.6 (A) 06/29/2020   HGBA1C 9.9 (H) 04/13/2020   HGBA1C 10.7 (H) 11/02/2018   Lab Results  Component Value Date   MICROALBUR 9.74 (H) 10/30/2012   LDLCALC 66 04/13/2020   CREATININE 1.49 (H) 06/08/2020      History Patient Active Problem List   Diagnosis Date Noted  . Type 2 diabetes mellitus with diabetic polyneuropathy, with long-term current use of insulin ([place]) 06/29/2020  . 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 Type 2 diabetes mellitus with hyperglycemia, with long-term current use of insulin ([place]) 06/29/2020  . 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 Pre-operative cardiovascular examination 02/18/2019  . Septic bursitis of elbow, right 10/30/2018  . CRI (chronic renal insufficiency), stage 3 (moderate) 07/26/2018  . Morbid obesity ([place]) 07/26/2018  . Intractable nausea and vomiting 03/06/2018  . Right leg pain 12/19/2017  . Leukocytosis 12/19/2017  . Benign positional vertigo 12/19/2017  . Weakness 12/12/2017  . Laryngopharyngeal reflux (LPR) 10/19/2017  . Post-nasal drainage 10/19/2017  . Acute sinusitis 10/19/2017  . Degeneration of lumbar intervertebral disc 07/29/2017  . 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 Obstructive sleep apnea on CPAP 02/09/2017  . Chronic diastolic heart failure ([place]) 11/08/2016  . Near syncope 01/30/2016  . SOB (shortness of breath)   . CAP (community acquired pneumonia) 09/12/2015  . Hypoxia 09/12/2015  . Essential hypertension 07/05/2015  . Hypotension 07/05/2015  . Diabetes mellitus with neurological manifestations ([place]) 11/04/2014  . Hyperlipidemia LDL goal <70 11/04/2014  . Spinal stenosis, multilevel 11/04/2014  . Hyperkalemia 11/01/2014  . Hypoglycemia 08/09/2014  . Type 2 diabetes mellitus with complication, with long-term current use of insulin ([place]) 08/08/2014  . Elevated troponin 08/08/2014  . Nausea vomiting and diarrhea 08/08/2014  . 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 Dizziness 08/06/2012  . Orthostatic hypotension 08/06/2012  . Hypernatremia  08/06/2012  . Acute gastroenteritis 08/13/2011  . Gastroparesis 08/13/2011  . Low back pain 08/13/2011  . Oral thrush 08/13/2011  . Gastroenteritis 05/23/2011  . Dehydration 05/23/2011  . Blindness 05/23/2011  . DM type 1, not at goal, causing eye disease ([place]) 05/23/2011  . Asthma 05/23/2011  . Diarrhea 05/23/2011  . Vomiting 05/23/2011  . Hypothyroidism 05/23/2011  . Diabetic neuropathy ([place]) 05/23/2011  . Diabetic nephropathy ([place]) 05/23/2011   Past Medical History:  Diagnosis Date  . Arthritis   . Asthma   . Blind   . Blindness and low vision    left eye glass eye,  legally blind in right eye  . Diabetes mellitus   . Diabetic neuropathy ([place])   . Hyperlipidemia   . Hypertension   . Hypothyroidism   . Spinal stenosis    Past Surgical History:  Procedure Laterality Date  . ABDOMINAL HYSTERECTOMY  1990  . [place]   x 2  . CHOLECYSTECTOMY  2008  .  ENUCLEATION Bilateral 09/15/1998  . EYE SURGERY  2016   fitting artificial eye   Allergies  Allergen Reactions  . Penicillins Hives and Swelling    Has patient had a PCN reaction causing immediate rash, facial/tongue/throat swelling, SOB or lightheadedness with hypotension: yes- face swelling Has patient had a PCN reaction causing severe rash involving mucus membranes or skin necrosis: no Has patient had a PCN reaction that required hospitalization unknown (childhood allergy) Has patient had a PCN reaction occurring within the last 10 years: no If all of the above answers are "NO", then may proceed with Cephalosporin use.   . Tramadol Nausea And Vomiting    Intense nausea  . Vicodin [Hydrocodone-Acetaminophen] Nausea And Vomiting  . Codeine Nausea Only  . Iodine-131 Hives  . Iohexol Hives    Pt developed itching and hives along with nasal congestion; needs 13 hour premeds for future studies, Onset Date: [id number]   . Lisinopril Cough  . Sulfa Antibiotics Nausea And Vomiting   Prior to Admission  medications   Medication Sig Start Date End Date Taking? Authorizing Provider  albuterol (PROVENTIL HFA;VENTOLIN HFA) 108 (90 BASE) MCG/ACT inhaler Inhale 2 puffs into the lungs every 4 (four) hours as needed for wheezing or shortness of breath. For short   Yes [provider]  aspirin EC 81 MG tablet Take 81 mg by mouth daily.   Yes [provider]  cetirizine (ZYRTEC) 10 MG tablet Take 10 mg by mouth daily.   Yes [provider]  Continuous Blood Gluc Receiver (FREESTYLE LIBRE 2 READER) DEVI As instructed. iddm with complications - retinopathy, neuropathy, nephropathy. With device and sensors. 04/13/20  Yes Wendie Agreste, MD  cyclobenzaprine (FLEXERIL) 5 MG tablet 1 pill by mouth up to every 8 hours as needed. Start with one pill by mouth each bedtime as needed due to sedation 07/10/20  Yes Wendie Agreste, MD  Dulaglutide (TRULICITY) 7.06 CB/7.6EG SOPN Inject 0.75 mg into the skin once a week. 06/29/20  Yes Shamleffer, Melanie Crazier, MD  EMBRACE TALK GLUCOSE TEST test strip USE TO CHECK BLOOD SUGARS FOUR TIMES A DAY BEFORE MEALS AND AT BEDTIME DX E11.29 07/29/20  Yes Wendie Agreste, MD  esomeprazole (NEXIUM) 40 MG capsule Take 1 capsule (40 mg total) by mouth daily. 07/10/20  Yes Bensimhon, Shaune Pascal, MD  gabapentin (NEURONTIN) 300 MG capsule Take 1 capsule (300 mg total) by mouth 3 (three) times daily. 06/23/20  Yes Hilts, Legrand Como, MD  HUMALOG KWIKPEN 100 UNIT/ML KwikPen Inject 8 units in morning, 12 units at lunch, and 14 units at dinner 06/30/20  Yes Shamleffer, Melanie Crazier, MD  Insulin Pen Needle (PEN NEEDLES) 31G X 8 MM MISC Inject 1 Device into the skin in the morning, at noon, in the evening, and at bedtime. 07/01/20  Yes Shamleffer, Melanie Crazier, MD  levothyroxine (SYNTHROID) 150 MCG tablet Take 1 tablet (150 mcg total) by mouth daily. Needs appt for future refills 04/30/20  Yes Bensimhon, Shaune Pascal, MD  linaclotide [facility]) 145 MCG CAPS capsule Take 145  mcg by mouth daily as needed (constipation).    Yes [provider]  naproxen (NAPROSYN) 500 MG tablet Take 500 mg by mouth 2 (two) times daily with a meal.   Yes [provider]  ondansetron (ZOFRAN ODT) 4 MG disintegrating tablet Take 1 tablet (4 mg total) by mouth every 8 (eight) hours as needed for nausea or vomiting. 07/09/19  Yes Antonietta Breach, PA-C  potassium chloride SA (KLOR-CON) 20 MEQ  tablet TAKE 1 TABLET (20 MEQ TOTAL) BY MOUTH DAILY. NEED APPT FOR FUTURE REFILLS 07/10/20  Yes Bensimhon, Shaune Pascal, MD  simvastatin (ZOCOR) 20 MG tablet Take 1 tablet (20 mg total) by mouth every evening. 07/10/20  Yes Bensimhon, Shaune Pascal, MD  spironolactone (ALDACTONE) 25 MG tablet Take 0.5 tablets (12.5 mg total) by mouth daily. Needs appt for future refills 07/10/20  Yes Bensimhon, Shaune Pascal, MD  torsemide (DEMADEX) 20 MG tablet Take 4 tablets (80 mg total) by mouth daily. Need appt for future refills 07/10/20  Yes Bensimhon, Shaune Pascal, MD  TRESIBA FLEXTOUCH 200 UNIT/ML FlexTouch Pen Inject 44 Units into the skin daily. 06/30/20  Yes Shamleffer, Melanie Crazier, MD   Social History   Socioeconomic History  . Marital status: Single    Spouse name: Not on file  . Number of children: 2  . Years of education: 68  . Highest education level: Not on file  Occupational History  . Occupation: N/A    Comment: disabled  Tobacco Use  . Smoking status: Former Smoker    Types: Cigarettes    Quit date: 05/22/1992    Years since quitting: 28.2  . Smokeless tobacco: Never Used  Vaping Use  . Vaping Use: Never used  Substance and Sexual Activity  . Alcohol use: No    Comment: quit 20 yrs ago  . Drug use: No  . Sexual activity: Not Currently  Other Topics Concern  . Not on file  Social History Narrative   Lives alone   caffeine drinks 2 cups of coffee a day, occasional soda    Social Determinants of Health   Financial Resource Strain: Medium Risk  . Difficulty of Paying Living Expenses:  Somewhat hard  Food Insecurity: No Food Insecurity  . Worried About [name] in the Last Year: Never true  . Ran Out of Food in the Last Year: Never true  Transportation Needs: Not on file  Physical Activity: Not on file  Stress: Not on file  Social Connections: Not on file  Intimate Partner Violence: Not on file    Review of Systems   Objective:   Vitals:   08/05/20 1122  BP: 129/81  Pulse: 81  Temp: (!) 97.1 F (36.2 C)  TempSrc: Temporal  SpO2: 96%  Weight: 214 lb (97.1 kg)  Height: _0  (1.499 m)     Physical Exam Vitals reviewed.  Constitutional:      General: She is not in acute distress.    Appearance: She is well-developed. She is not ill-appearing or diaphoretic.  HENT:     Head: Normocephalic and atraumatic.  Eyes:     Conjunctiva/sclera: Conjunctivae normal.     Pupils: Pupils are equal, round, and reactive to light.  Neck:     Vascular: No carotid bruit.  Cardiovascular:     Rate and Rhythm: Normal rate and regular rhythm.     Heart sounds: Normal heart sounds.  Pulmonary:     Effort: Pulmonary effort is normal.     Breath sounds: Normal breath sounds.  Abdominal:     Palpations: Abdomen is soft. There is no pulsatile mass.     Tenderness: There is no abdominal tenderness.  Musculoskeletal:     Right lower leg: Edema (tr- 1+bilat) present.  Skin:    General: Skin is warm and dry.  Neurological:     General: No focal deficit present.     Mental Status: She is alert and oriented to person, place,  and time.  Psychiatric:        Mood and Affect: Mood normal.        Behavior: Behavior normal.    50 minutes spent during visit, greater than 50% counseling and assimilation of information, chart review, and discussion of plan, repeat assessments.   Results for orders placed or performed in visit on 08/05/20  POCT glucose (manual entry)  Result Value Ref Range   POC Glucose 69 (A) 70 - 99 mg/dl  POCT glucose (manual entry)  Result  Value Ref Range   POC Glucose 80 70 - 99 mg/dl     Assessment & Plan:  DWIGHT BURDO is a 60 y.o. female . Type 2 diabetes mellitus with complication, with long-term current use of insulin (HCC)  Hypoglycemia - Plan: POCT glucose (manual entry), POCT glucose (manual entry)  Unfortunately still having frequent episodes of hypoglycemia.  Has had some weight improvements over time, likely due to improvements in diet.  Denies nausea, vomiting or abdominal pain.  Denies additional or extra doses of insulin, but will have her health aide verify that what she is drawing up is accurate. Hypoglycemia in office with minimal symptoms, quickly improved with 15 g glucose.  Close monitoring of glucose once she goes home was recommended and discussed with her aide.  Understanding expressed.  We will adjust regimen for now to her lower dose of Tresiba 30 units, and decrease her mealtime insulins by half, and close monitoring with fasting and 2-hour postprandials, update in the next 1 week with a phone call.   Will need to determine her level of control prior to completing paperwork for camp -I do have concerns regarding some of those activities, access to medical care if she were to become hypoglycemic.    No orders of the defined types were placed in this encounter.  Patient Instructions    [facility] Address: [address] [place], [place], [place] [address] Phone: [phone]   I will send a message to your endocrinologist but I am concerned about the frequent low blood sugars. Decrease tresiba to 30 units for now. Decrease mealtime insulin to 3 units with breakfast, 5 units with lunch and 7 units with dinner until I get more info form your endocrinologist. Having your aide verify amount of insulin that is drawn up may also be helpful. If those amounts are not what has been ordered - let me know so we can talk about med adjustments.   I would like to see more stable diabetes before I  complete your camp form. I will call you in next week, so keep a record of your blood sugars (fasting, 2 hours after meals) to discuss on that call.   Return to the clinic or go to the nearest emergency room if any of your symptoms worsen or new symptoms occur.    Hypoglycemia Hypoglycemia occurs when the level of sugar (glucose) in the blood is too low. Hypoglycemia can happen in people who have or do not have diabetes. It can develop quickly, and it can be a medical emergency. For most people, a blood glucose level below 70 mg/dL (3.9 mmol/L) is considered hypoglycemia. Glucose is a type of sugar that provides the body's main source of energy. Certain hormones (insulin and glucagon) control the level of glucose in the blood. Insulin lowers blood glucose, and glucagon raises blood glucose. Hypoglycemia can result from having too much insulin in the bloodstream, or from not eating enough food that contains  glucose. You may also have reactive hypoglycemia, which happens within 4 hours after eating a meal. What are the causes? Hypoglycemia occurs most often in people who have diabetes and may be caused by:  Diabetes medicine.  Not eating enough, or not eating often enough.  Increased physical activity.  Drinking alcohol on an empty stomach. If you do not have diabetes, hypoglycemia may be caused by:  A tumor in the pancreas.  Not eating enough, or not eating for long periods at a time (fasting).  A severe infection or illness.  Problems after having bariatric surgery.  Organ failure, such as kidney or liver failure.  Certain medicines. What increases the risk? Hypoglycemia is more likely to develop in people who:  Have diabetes and take medicines to lower blood glucose.  Abuse alcohol.  Have a severe illness. What are the signs or symptoms? Symptoms vary depending on whether the condition is mild, moderate, or severe. Mild hypoglycemia  Hunger.  Anxiety.  Sweating and  feeling clammy.  Dizziness or feeling light-headed.  Sleepiness or restless sleep.  Nausea.  Increased heart rate.  Headache.  Blurry vision.  Irritability.  Tingling or numbness around the mouth, lips, or tongue.  A change in coordination. Moderate hypoglycemia  Confusion and poor judgment.  Behavior changes.  Weakness.  Irregular heartbeat. Severe hypoglycemia Severe hypoglycemia is a medical emergency. It can cause:  Fainting.  Seizures.  Loss of consciousness (coma).  Death. How is this diagnosed? Hypoglycemia is diagnosed with a blood test to measure your blood glucose level. This blood test is done while you are having symptoms. Your health care provider may also do a physical exam and review your medical history. How is this treated? This condition can be treated by immediately eating or drinking something that contains sugar with 15 grams of rapid-acting carbohydrate, such as:  4 oz (120 mL) of fruit juice.  4-6 oz (120-150 mL) of regular soda (not diet soda).  8 oz (240 mL) of low-fat milk.  Several pieces of hard candy. Check food labels to find out how many to eat for 15 grams.  1 Tbsp (15 mL) of sugar or honey. Treating hypoglycemia if you have diabetes If you are alert and able to swallow safely, follow the 15:15 rule:  Take 15 grams of a rapid-acting carbohydrate. Talk with your health care provider about how much you should take. Options for getting 15 grams of rapid-acting carbohydrate include: ? Glucose tablets (take 4 tablets). ? Several pieces of hard candy. Check food labels to find out how many pieces to eat for 15 grams. ? 4 oz (120 mL) of fruit juice. ? 4-6 oz (120-150 mL) of regular (not diet) soda. ? 1 Tbsp (15 mL) of honey or sugar.  Check your blood glucose 15 minutes after you take the carbohydrate.  If the repeat blood glucose level is still at or below 70 mg/dL (3.9 mmol/L), take 15 grams of a carbohydrate again.  If your  blood glucose level does not increase above 70 mg/dL (3.9 mmol/L) after 3 tries, seek emergency medical care.  After your blood glucose level returns to normal, eat a meal or a snack within 1 hour.   Treating severe hypoglycemia Severe hypoglycemia is when your blood glucose level is at or below 54 mg/dL (3 mmol/L). Severe hypoglycemia is a medical emergency. Get medical help right away. If you have severe hypoglycemia and you cannot eat or drink, you will need to be given glucagon. A family member  or close friend should learn how to check your blood glucose and how to give you glucagon. Ask your health care provider if you need to have an emergency glucagon kit available. Severe hypoglycemia may need to be treated in a hospital. The treatment may include getting glucose through an IV. You may also need treatment for the cause of your hypoglycemia. Follow these instructions at home: General instructions  Take over-the-counter and prescription medicines only as told by your health care provider.  Monitor your blood glucose as told by your health care provider.  If you drink alcohol: ? Limit how much you use to:  0-1 drink a day for nonpregnant women.  0-2 drinks a day for men. ? Be aware of how much alcohol is in your drink. In the [place] one drink equals one 12 oz bottle of beer (355 mL), one 5 oz glass of wine (148 mL), or one 1 oz glass of hard liquor (44 mL).  Keep all follow-up visits as told by your health care provider. This is important. If you have diabetes:  Always have a rapid-acting carbohydrate (15 grams) option with you to treat low blood glucose.  Follow your diabetes management plan as directed. Make sure you: ? Know the symptoms of hypoglycemia. It is important to treat it right away to prevent it from becoming severe. ? Check your blood glucose as often as told. Always check before and after exercise. ? Always check your blood glucose before you drive a motorized  vehicle. ? Take your medicines as told. ? Follow your meal plan. Eat on time, and do not skip meals.  Share your diabetes management plan with people in your workplace, school, and household.  Carry a medical alert card or wear medical alert jewelry.   Contact a health care provider if:  You have problems keeping your blood glucose in your target range.  You have frequent episodes of hypoglycemia. Get help right away if:  You continue to have hypoglycemia symptoms after eating or drinking something that contains 15 grams of fast-acting carbohydrate and you cannot get your blood glucose above 70 mg/dL (3.9 mmol/L) while following the 15:15 rule.  Your blood glucose is at or below 54 mg/dL (3 mmol/L).  You have a seizure.  You faint. These symptoms may represent a serious problem that is an emergency. Do not wait to see if the symptoms will go away. Get medical help right away. Call your local emergency services (911 in the [place] Do not drive yourself to the hospital. Summary  Hypoglycemia occurs when the level of sugar (glucose) in the blood is too low.  Hypoglycemia can happen in people who have or do not have diabetes. It can develop quickly, and it can be a medical emergency.  Make sure you know the symptoms of hypoglycemia and how to treat it.  Always have a rapid-acting carbohydrate snack with you to treat low blood sugar. This information is not intended to replace advice given to you by your health care provider. Make sure you discuss any questions you have with your health care provider. Document Revised: 03/27/2019 Document Reviewed: 03/27/2019 Elsevier Patient Education  2021 [employer].   If you have lab work done today you will be contacted with your lab results within the next 2 weeks.  If you have not heard from [place] then please contact us. The fastest way to get your results is to register for My Chart.   IF you received an x-ray today, you  will receive an  invoice from [facility]. Please contact [facility] at [phone] with questions or concerns regarding your invoice.   IF you received labwork today, you will receive an invoice from [place]. Please contact [facility] at [phone] with questions or concerns regarding your invoice.   Our billing staff will not be able to assist you with questions regarding bills from these companies.  You will be contacted with the lab results as soon as they are available. The fastest way to get your results is to activate your My Chart account. Instructions are located on the last page of this paperwork. If you have not heard from [place] regarding the results in 2 weeks, please contact this office.         Signed, Merri Ray, MD [facility]

## 2020-08-05 NOTE — Telephone Encounter (Signed)
08/05/2020 - [facility] IS CALLING BACK TO FIND OUT THE STATUS OF THE EMBRACE TALK TEST STRIPS. THEY HAVE FAXED AND CALLED [place]. PLEASE CALL THEM BACK AS SOON AS POSSIBLE. [facility] NUMBER IS: [phone]  [place]

## 2020-08-06 ENCOUNTER — Telehealth: Payer: Self-pay | Admitting: Family Medicine

## 2020-08-06 ENCOUNTER — Other Ambulatory Visit: Payer: Self-pay

## 2020-08-06 DIAGNOSIS — Z794 Long term (current) use of insulin: Secondary | ICD-10-CM

## 2020-08-06 DIAGNOSIS — E118 Type 2 diabetes mellitus with unspecified complications: Secondary | ICD-10-CM

## 2020-08-06 MED ORDER — POTASSIUM CHLORIDE CRYS ER 20 MEQ PO TBCR: 20.0000 meq | EXTENDED_RELEASE_TABLET | Freq: Every day | ORAL | 0 refills | Status: DC

## 2020-08-06 MED ORDER — SPIRONOLACTONE 25 MG PO TABS: 12.5000 mg | ORAL_TABLET | Freq: Every day | ORAL | 0 refills | Status: DC

## 2020-08-06 MED ORDER — LEVOTHYROXINE SODIUM 150 MCG PO TABS: 150.0000 ug | ORAL_TABLET | Freq: Every day | ORAL | 0 refills | Status: DC

## 2020-08-06 MED ORDER — SIMVASTATIN 20 MG PO TABS: 20.0000 mg | ORAL_TABLET | Freq: Every evening | ORAL | 0 refills | Status: AC

## 2020-08-06 MED ORDER — FREESTYLE LIBRE 2 READER DEVI: 0 refills | Status: DC

## 2020-08-06 MED ORDER — BLOOD GLUCOSE METER KIT: PACK | 0 refills | Status: AC

## 2020-08-06 MED ORDER — TORSEMIDE 20 MG PO TABS: 80.0000 mg | ORAL_TABLET | Freq: Every day | ORAL | 0 refills | Status: DC

## 2020-08-06 NOTE — Telephone Encounter (Signed)
Done

## 2020-08-06 NOTE — Telephone Encounter (Signed)
Elie Goody calling from [employer] .  Patient aid stated that her blood sugar was too low and thought that they would be sending in a new medicine   Pharmacy has no new meds please advise .   Please Jyl Heinz at [phone]    Also  needs upf=date on Prior Auth for  EMBRACE TALK GLUCOSE TEST test strip  Patient picked up yesterday but paid out of pocket    Please advise

## 2020-08-07 ENCOUNTER — Telehealth: Payer: Self-pay | Admitting: Podiatry

## 2020-08-07 NOTE — Telephone Encounter (Signed)
Pt called earlier and spoke to [place] with some billing questions and Levada Dy told pt I would call her back..  I returned call and pt stated she got a call from the collection agency about a bill for our office.  Upon looking the balance braces she received 04/2019 was turned over to collections. I explained that and she said she forgot about those and does have a payment plan set up with them to get it paid off. She said thank you for the call back and letting her know.

## 2020-08-17 ENCOUNTER — Telehealth: Payer: Self-pay | Admitting: Family Medicine

## 2020-08-17 NOTE — Telephone Encounter (Signed)
Calling about a form that was left to be filled out at [facility] - please advise

## 2020-08-19 NOTE — Telephone Encounter (Signed)
Called pt and she is agreeable will track 2 glucose daily and report lows. Phone call okay for next Wednesday

## 2020-08-19 NOTE — Telephone Encounter (Signed)
At last visit there was some concern about recurrent hypoglycemia.  I do want to catch up with her to decide if her control has been better.  Please try to have her keep a record of her blood sugars from now through next Wednesday (including if ANY lows) and we can talk at that point to decide if other changes needed or office visit needed prior to completing her form.

## 2020-08-20 ENCOUNTER — Other Ambulatory Visit: Payer: Self-pay | Admitting: *Deleted

## 2020-08-20 NOTE — Patient Outreach (Signed)
[facility]) Care Management  08/20/2020  LISBET MCCADDEN [birth year] [name]  Unsuccessful outreach attempt made to patient. Patient answered the phone and stated that she would not be able to speak today. She did request that this nurse call back at a later date.   Plan: RN Health Coach will call patient within the month of May.  [name] RN, BSN [place] [phone] [email or web address]

## 2020-08-31 ENCOUNTER — Other Ambulatory Visit: Payer: Self-pay | Admitting: Family Medicine

## 2020-08-31 ENCOUNTER — Telehealth: Payer: Self-pay | Admitting: Family Medicine

## 2020-08-31 NOTE — Progress Notes (Signed)
Opened in error

## 2020-08-31 NOTE — Telephone Encounter (Signed)
Left message to check status and home readings so that I can complete her paperwork for the camp.

## 2020-09-02 ENCOUNTER — Encounter: Payer: Self-pay | Admitting: Family Medicine

## 2020-09-02 NOTE — Telephone Encounter (Signed)
Pt called back, she states that on   3/24 at 6:30AM  Blood sugar was 90  10AM  - 230  12PM - 187  3/25 at 8AM 120  9:30AM 130  04/20 at 9AM - 149  Please advise

## 2020-09-04 NOTE — Telephone Encounter (Signed)
Great, please obtain her camp form and I can complete it.

## 2020-09-04 NOTE — Telephone Encounter (Signed)
Left a vm message to confirm wether the patient dropped off her camp form for Dr. Carlota Raspberry to fill out.

## 2020-09-08 NOTE — Telephone Encounter (Signed)
Camp form placed in to sign folder for you

## 2020-09-09 ENCOUNTER — Telehealth: Payer: Self-pay | Admitting: Nutrition

## 2020-09-09 NOTE — Telephone Encounter (Signed)
Patient left message on my machine that blood sugars have been in the mid to upper 200s for the last 1-2 weeks.   Libre download was put on Dr. Ronnie Derby desk for review. Called patient back and left message that I downloaded her data, but that Dr. Dwyane Dee was off this afternoon and someone will contact her tomorrow.

## 2020-09-10 NOTE — Telephone Encounter (Signed)
PLease bring this pt in to see me ASAP  Thanks

## 2020-09-10 NOTE — Telephone Encounter (Signed)
Forwarding to Dr. Maretta Bees

## 2020-09-11 ENCOUNTER — Telehealth: Payer: Self-pay | Admitting: Family Medicine

## 2020-09-11 NOTE — Telephone Encounter (Signed)
If you can not get hold of the pt , please mail a letter asking to call ASAP

## 2020-09-11 NOTE — Telephone Encounter (Signed)
Camp forms have been faxed to [phone] and sent to scan

## 2020-09-11 NOTE — Telephone Encounter (Signed)
Spoken to patient and was able to schedule patient on Friday 09/18/2020

## 2020-09-11 NOTE — Telephone Encounter (Signed)
Called pt. Still occasional lows. 66 yesterday. Had not yet eaten dinner. Has glucose tabs but not needing to take. No syncope/near syncope. Marland Kitchen appt with endocrinologist on the 6th of May. I advised her to discuss medication regimen with her endocrinologist at that visit and a plan to minimize risk of hypoglycemia.  Additionally I recommended she discuss a plan when she is at camp to further minimize risk of hypoglycemia.  Understanding expressed.  Form was completed and will be faxed to the camp at [facility].

## 2020-09-17 NOTE — Telephone Encounter (Signed)
This encounter was created in error - please disregard.

## 2020-09-18 ENCOUNTER — Ambulatory Visit (INDEPENDENT_AMBULATORY_CARE_PROVIDER_SITE_OTHER): Payer: 59 | Admitting: Internal Medicine

## 2020-09-18 ENCOUNTER — Encounter: Payer: Self-pay | Admitting: *Deleted

## 2020-09-18 ENCOUNTER — Other Ambulatory Visit: Payer: Self-pay

## 2020-09-18 ENCOUNTER — Encounter: Payer: Self-pay | Admitting: Internal Medicine

## 2020-09-18 VITALS — BP 124/84 | HR 89 | Ht 59.0 in | Wt 212.4 lb

## 2020-09-18 DIAGNOSIS — E11319 Type 2 diabetes mellitus with unspecified diabetic retinopathy without macular edema: Secondary | ICD-10-CM | POA: Diagnosis not present

## 2020-09-18 DIAGNOSIS — Z794 Long term (current) use of insulin: Secondary | ICD-10-CM

## 2020-09-18 DIAGNOSIS — E1142 Type 2 diabetes mellitus with diabetic polyneuropathy: Secondary | ICD-10-CM

## 2020-09-18 DIAGNOSIS — E1165 Type 2 diabetes mellitus with hyperglycemia: Secondary | ICD-10-CM

## 2020-09-18 LAB — POCT GLYCOSYLATED HEMOGLOBIN (HGB A1C): Hemoglobin A1C: 7.6 % — AB (ref 4.0–5.6)

## 2020-09-18 MED ORDER — TRULICITY 1.5 MG/0.5ML ~~LOC~~ SOAJ: 1.5000 mg | SUBCUTANEOUS | 2 refills | Status: DC

## 2020-09-18 MED ORDER — TRESIBA FLEXTOUCH 200 UNIT/ML ~~LOC~~ SOPN: 22.0000 [IU] | PEN_INJECTOR | Freq: Every day | SUBCUTANEOUS | 4 refills | Status: DC

## 2020-09-18 MED ORDER — HUMALOG KWIKPEN 100 UNIT/ML ~~LOC~~ SOPN: PEN_INJECTOR | SUBCUTANEOUS | 3 refills | Status: DC

## 2020-09-18 NOTE — Progress Notes (Signed)
Name: Amy [name]  Age/ Sex: 60 y.o., female   MRN/ DOB: [id number], [birth year]     PCP: Wendie Agreste, MD   Reason for Endocrinology Evaluation: Type 2 Diabetes Mellitus  Initial Endocrine Consultative Visit: 06/29/2020    PATIENT IDENTIFIER: [name] is a 60 y.o. female with a past medical history of T2DM, Hypothyroidism and Dyslipidemia. The patient has followed with Endocrinology clinic since 06/29/2020 for consultative assistance with management of her diabetes.  DIABETIC HISTORY:  Ms. Hollopeter was diagnosed with T2DM in her 28's. Her hemoglobin A1c has ranged from 9.9% in 2021, peaking at 12.3% in 2014.   SUBJECTIVE:   Today (09/18/2020): [name] is here for a follow up on diabetes management. She is accompanied by her assistant. She checks her blood sugars multiple times a day through CGM.   The patient has  had hypoglycemic episodes since the last clinic visit, which typically occur 3 x /week - most often occuring during the day and night . The patient is  symptomatic with these episodes  Denies nausea or diarrhea      HOME DIABETES REGIMEN:  Tresiba 30 units  Humalog 4 with breakfast, 5 with lunch and 7 units with supper  Trulicity 4.28 mg weekly ( Friday )      Statin: yes ACE-I/ARB: no     CONTINUOUS GLUCOSE MONITORING RECORD INTERPRETATION    Dates of Recording: 4/23-09/18/2020   Sensor description: freestyle libre   Results statistics:   CGM use % of time 73  Average and SD 155/31.2  Time in range     69   %  % Time Above 180 28  % Time above 250 3  % Time Below target      Glycemic patterns summary: postprandial hyperglycemia noted, BG's optimal overnight   Hyperglycemic episodes  postprandial  Hypoglycemic episodes occurred during the day and night   Overnight periods: trends down         DIABETIC COMPLICATIONS: Microvascular complications:    Denies: CKD III, neuropathy.  Left prosthetic eye. Blind right eye   Last  Eye Exam: Completed 04/2020 ( Dr. Zadie Rhine)    Macrovascular complications:    Denies: CAD, CVA, PVD   HISTORY:  Past Medical History:  Past Medical History:  Diagnosis Date  . Arthritis   . Asthma   . Blind   . Blindness and low vision    left eye glass eye,  legally blind in right eye  . Diabetes mellitus   . Diabetic neuropathy ([place])   . Hyperlipidemia   . Hypertension   . Hypothyroidism   . Spinal stenosis    Past Surgical History:  Past Surgical History:  Procedure Laterality Date  . ABDOMINAL HYSTERECTOMY  1990  . [place]   x 2  . CHOLECYSTECTOMY  2008  . ENUCLEATION Bilateral 09/15/1998  . EYE SURGERY  2016   fitting artificial eye    Social History:  reports that she quit smoking about 28 years ago. Her smoking use included cigarettes. She has never used smokeless tobacco. She reports that she does not drink alcohol and does not use drugs. Family History:  Family History  Problem Relation Age of Onset  . Diabetes Sister   . Glaucoma Sister   . Hypertension Sister   . Healthy Mother   . Healthy Father   . Stroke Brother   . Heart attack Paternal Aunt   . Breast cancer Neg Hx  HOME MEDICATIONS: Allergies as of 09/18/2020      Reactions   Penicillins Hives, Swelling   Has patient had a PCN reaction causing immediate rash, facial/tongue/throat swelling, SOB or lightheadedness with hypotension: yes- face swelling Has patient had a PCN reaction causing severe rash involving mucus membranes or skin necrosis: no Has patient had a PCN reaction that required hospitalization unknown (childhood allergy) Has patient had a PCN reaction occurring within the last 10 years: no If all of the above answers are "NO", then may proceed with Cephalosporin use.   Tramadol Nausea And Vomiting   Intense nausea   Vicodin [hydrocodone-acetaminophen] Nausea And Vomiting   Codeine Nausea Only   Iodine-131 Hives   Iohexol Hives   Pt developed itching  and hives along with nasal congestion; needs 13 hour premeds for future studies, Onset Date: [id number]   Lisinopril Cough   Sulfa Antibiotics Nausea And Vomiting      Medication List       Accurate as of Sep 18, 2020  4:03 PM. If you have any questions, ask your nurse or doctor.        STOP taking these medications   Trulicity 6.76 PP/5.0DT Sopn Generic drug: Dulaglutide Replaced by: Trulicity 1.5 OI/7.1IW Sopn Stopped by: Dorita Sciara, MD     TAKE these medications   albuterol 108 (90 Base) MCG/ACT inhaler Commonly known as: VENTOLIN HFA Inhale 2 puffs into the lungs every 4 (four) hours as needed for wheezing or shortness of breath. For short   aspirin EC 81 MG tablet Take 81 mg by mouth daily.   blood glucose meter kit and supplies Dispense based on patient and insurance preference. Use up to four times daily as directed. (FOR ICD-10 E10.9, E11.9).   cetirizine 10 MG tablet Commonly known as: ZYRTEC Take 10 mg by mouth daily.   cyclobenzaprine 5 MG tablet Commonly known as: FLEXERIL 1 pill by mouth up to every 8 hours as needed. Start with one pill by mouth each bedtime as needed due to sedation   Embrace Talk Glucose Test test strip Generic drug: glucose blood USE TO CHECK BLOOD SUGARS FOUR TIMES A DAY BEFORE MEALS AND AT BEDTIME DX E11.29   esomeprazole 40 MG capsule Commonly known as: NEXIUM Take 1 capsule (40 mg total) by mouth daily.   FreeStyle [place] 2 Reader [place] As instructed. iddm with complications - retinopathy, neuropathy, nephropathy. With device and sensors.   gabapentin 300 MG capsule Commonly known as: NEURONTIN Take 1 capsule (300 mg total) by mouth 3 (three) times daily.   HumaLOG KwikPen 100 UNIT/ML KwikPen Generic drug: insulin lispro Inject 8 units in morning, 12 units at lunch, and 14 units at dinner What changed: additional instructions   levothyroxine 150 MCG tablet Commonly known as: SYNTHROID Take 1 tablet (150 mcg total)  by mouth daily. Needs appt for future refills   linaclotide 145 MCG Caps capsule Commonly known as: LINZESS Take 145 mcg by mouth daily as needed (constipation).   naproxen 500 MG tablet Commonly known as: NAPROSYN Take 500 mg by mouth 2 (two) times daily with a meal.   ondansetron 4 MG disintegrating tablet Commonly known as: Zofran ODT Take 1 tablet (4 mg total) by mouth every 8 (eight) hours as needed for nausea or vomiting.   Pen Needles 31G X 8 MM Misc Inject 1 Device into the skin in the morning, at noon, in the evening, and at bedtime.   potassium chloride SA 20 MEQ tablet Commonly  known as: KLOR-CON Take 1 tablet (20 mEq total) by mouth daily. Need appt for future refills   simvastatin 20 MG tablet Commonly known as: ZOCOR Take 1 tablet (20 mg total) by mouth every evening.   spironolactone 25 MG tablet Commonly known as: ALDACTONE Take 0.5 tablets (12.5 mg total) by mouth daily. Needs appt for future refills   torsemide 20 MG tablet Commonly known as: DEMADEX Take 4 tablets (80 mg total) by mouth daily. Need appt for future refills   Tresiba FlexTouch 200 UNIT/ML FlexTouch Pen Generic drug: insulin degludec Inject 44 Units into the skin daily. What changed: how much to take   Trulicity 1.5 WP/8.0DX Sopn Generic drug: Dulaglutide Inject 1.5 mg into the skin once a week. Replaces: Trulicity 8.33 AS/5.0NL Sopn Started by: Dorita Sciara, MD        OBJECTIVE:   Vital Signs: BP 124/84   Pulse 89   Ht $R'4\' 11"'fa$  (1.499 m)   Wt 212 lb 6 oz (96.3 kg)   SpO2 94%   BMI 42.89 kg/m   Wt Readings from Last 3 Encounters:  09/18/20 212 lb 6 oz (96.3 kg)  08/05/20 214 lb (97.1 kg)  07/02/20 208 lb (94.3 kg)     Exam: General: Pt appears well and is in NAD  Hydration: Well-hydrated with moist mucous membranes and good skin turgor  HEENT: Head: Unremarkable with good dentition. Oropharynx clear without exudate.  Eyes: External eye exam normal without  stare, lid lag or exophthalmos.  EOM intact.  PERRL.  Neck: General: Supple without adenopathy. Thyroid: Thyroid size normal.  No goiter or nodules appreciated. No thyroid bruit.  Lungs: Clear with good BS bilat with no rales, rhonchi, or wheezes  Heart: RRR with normal S1 and S2 and no gallops; no murmurs; no rub  Extremities: Race  pretibial edema.   Neuro: MS is good with appropriate affect, pt is alert and Ox3              DATA REVIEWED:  Lab Results  Component Value Date   HGBA1C 7.6 (A) 09/18/2020   HGBA1C 7.6 (A) 06/29/2020   HGBA1C 9.9 (H) 04/13/2020   Lab Results  Component Value Date   MICROALBUR 9.74 (H) 10/30/2012   LDLCALC 66 04/13/2020   CREATININE 1.49 (H) 06/08/2020   Lab Results  Component Value Date   MICRALBCREAT 104.4 (H) 10/30/2012     Lab Results  Component Value Date   CHOL 126 04/13/2020   HDL 42 04/13/2020   LDLCALC 66 04/13/2020   TRIG 95 04/13/2020   CHOLHDL 3.0 04/13/2020         ASSESSMENT / PLAN / RECOMMENDATIONS:   1) Type 2 Diabetes Mellitus, suboptimally controlled, With neuropathic, retinopathic and CKD III complications - Most recent A1c of 7.6%. Goal A1c < 7.0 %.    -Patient continues with hypoglycemia through the day as well as early morning hours at times. -She is tolerating Trulicity well, we will increase the dose as below -I am also going to adjust her insulin regimen as below -She will be provided with a correction scale to use for hypoglycemia, which has been noted after supper   MEDICATIONS: - Decrease Tresiba to 22 units daily  - Decrease Humalog to 3 units with Breakfast, 4 units with Lunch and 7 units with Supper  - Increased Trulicity 1.5  mg weekly  -CF: Humalog (BG -130/45)  EDUCATION / INSTRUCTIONS:  BG monitoring instructions: Patient is instructed to check her blood sugars  3 times a day, before meals.  Call [place] [facility] clinic if: BG persistently < 70  . I reviewed the Rule of 15 for the  treatment of hypoglycemia in detail with the patient. Literature supplied.    2) Diabetic complications:   Eye: Does  have known diabetic retinopathy.   Neuro/ Feet: Does  have known diabetic peripheral neuropathy .   Renal: Patient does  have known baseline CKD. She   is not on an ACEI/ARB at present.      F/U in 3 months   Signed electronically by: Mack Guise, MD  [facility]  [address] [place], [place] [address] Phone: [phone] FAX: [phone]   CC: Wendie Agreste, MD [address] [place]  [address] Phone: [phone]  Fax: [phone]  Return to Endocrinology clinic as below: Future Appointments  Date Time Provider [place]  10/13/2020 11:30 AM Debbora Presto, NP [facility] None  10/26/2020 10:30 AM Marzetta Board, DPM [facility]  10/27/2020  8:50 AM Gildardo Pounds, NP [facility] None  11/30/2020 11:30 AM Laretta Alstrom, Argie Ramming, RN [facility] None  12/23/2020 10:30 AM Kwadwo Taras, Melanie Crazier, MD [facility] None

## 2020-09-18 NOTE — Patient Instructions (Addendum)
-   Decrease Tresiba to 22 units daily  - Decrease Humalog to 3 units with Breakfast, 4 units with Lunch and 7 units with Supper  - Increased Trulicity 1.5  mg weekly  - Humalog correctional insulin: ADD extra units on insulin to your meal-time Humalog dose if your blood sugars are higher than 175. Use the scale below to help guide you:   Blood sugar before meal Number of units to inject  Less than 175 0 unit  176 -  220 1 units  221 -  265 2 units  266 -  310 3 units  311 -  355 4 units  356 -  400 5 units  401 - 445 6 units       HOW TO TREAT LOW BLOOD SUGARS (Blood sugar LESS THAN 70 MG/DL)  Please follow the RULE OF 15 for the treatment of hypoglycemia treatment (when your (blood sugars are less than 70 mg/dL)    STEP 1: Take 15 grams of carbohydrates when your blood sugar is low, which includes:   3-4 GLUCOSE TABS  OR  3-4 OZ OF JUICE OR REGULAR SODA OR  ONE TUBE OF GLUCOSE GEL     STEP 2: RECHECK blood sugar in 15 MINUTES STEP 3: If your blood sugar is still low at the 15 minute recheck --> then, go back to STEP 1 and treat AGAIN with another 15 grams of carbohydrates.      HOW TO TREAT LOW BLOOD SUGARS (Blood sugar LESS THAN 70 MG/DL)  Please follow the RULE OF 15 for the treatment of hypoglycemia treatment (when your (blood sugars are less than 70 mg/dL)    STEP 1: Take 15 grams of carbohydrates when your blood sugar is low, which includes:   3-4 GLUCOSE TABS  OR  3-4 OZ OF JUICE OR REGULAR SODA OR  ONE TUBE OF GLUCOSE GEL     STEP 2: RECHECK blood sugar in 15 MINUTES STEP 3: If your blood sugar is still low at the 15 minute recheck --> then, go back to STEP 1 and treat AGAIN with another 15 grams of carbohydrates.

## 2020-09-18 NOTE — Patient Outreach (Signed)
[facility]) Care Management  09/18/2020  VALEKA MASSAR December 10, 1960 [id number]  Unsuccessful outreach attempt made to patient. RN Health Coach left HIPAA compliant voicemail message along with her contact information.  Plan: RN Health Coach will call patient within the month of June.  [name] RN, BSN [place] [phone] [place] [email or web address]

## 2020-09-30 ENCOUNTER — Other Ambulatory Visit: Payer: Self-pay

## 2020-09-30 ENCOUNTER — Ambulatory Visit (INDEPENDENT_AMBULATORY_CARE_PROVIDER_SITE_OTHER): Payer: 59 | Admitting: Family Medicine

## 2020-09-30 ENCOUNTER — Encounter: Payer: Self-pay | Admitting: Family Medicine

## 2020-09-30 DIAGNOSIS — M5412 Radiculopathy, cervical region: Secondary | ICD-10-CM

## 2020-09-30 DIAGNOSIS — H5440 Blindness, one eye, unspecified eye: Secondary | ICD-10-CM

## 2020-09-30 NOTE — Progress Notes (Signed)
Office Visit Note   [name]           Date of Birth: Sep 14, 1960           MRN: [name] Visit Date: 09/30/2020 Requested by: Wendie Agreste, MD [address] [place],  [place] [address] PCP: No primary care provider on file.  Subjective: Chief Complaint  Patient presents with  . Neck - Pain    Continues to feel pain in the neck and down the left arm to the fingers. Tingling in the fingers. Now starting to have some tingling in the right arm. She says she never heard from any home PT company (see Feb. OV note).    HPI: She is here for follow-up cervical radiculopathy.  Unfortunately she was never contacted by home health physical therapy.  I am not sure what happened.  She has not received any treatment since last visit.  Her symptoms are the same with radiation into the left arm and hand.              ROS:   All other systems were reviewed and are negative.  Objective: Vital Signs: There were no vitals taken for this visit.  Physical Exam:  General:  Alert and oriented, in no acute distress. Pulm:  Breathing unlabored. Psy:  Normal mood, congruent affect.  Neck: She has increased left arm pain when looking upward.  Upper extremity strength and reflexes remain normal.  She has multiple tender trigger points in the left trapezius.  Imaging: No results found.  Assessment & Plan: 1.  Neck pain with left arm radiculopathy -We will try to arrange home health physical therapy.  If she fails to improve, then MRI scan possibly followed by epidural injection depending on the findings.     Procedures: No procedures performed        PMFS History: Patient Active Problem List   Diagnosis Date Noted  . Type 2 diabetes mellitus with diabetic polyneuropathy, with long-term current use of insulin ([place]) 06/29/2020  . 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 Type 2 diabetes mellitus with hyperglycemia, with long-term current use of insulin ([place]) 06/29/2020  . 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 Pre-operative cardiovascular examination 02/18/2019  . Septic bursitis of elbow, right 10/30/2018  . CRI (chronic renal insufficiency), stage 3 (moderate) 07/26/2018  . Morbid obesity ([place]) 07/26/2018  . Intractable nausea and vomiting 03/06/2018  . Right leg pain 12/19/2017  . Leukocytosis 12/19/2017  . Benign positional vertigo 12/19/2017  . Weakness 12/12/2017  . Laryngopharyngeal reflux (LPR) 10/19/2017  . Post-nasal drainage 10/19/2017  . Acute sinusitis 10/19/2017  . Degeneration of lumbar intervertebral disc 07/29/2017  . 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 Obstructive sleep apnea on CPAP 02/09/2017  . Chronic diastolic heart failure ([place]) 11/08/2016  . Near syncope 01/30/2016  . SOB (shortness of breath)   . CAP (community acquired pneumonia) 09/12/2015  . Hypoxia 09/12/2015  . Essential hypertension 07/05/2015  . Hypotension 07/05/2015  . Diabetes mellitus with neurological manifestations ([place]) 11/04/2014  . Hyperlipidemia LDL goal <70 11/04/2014  . Spinal stenosis, multilevel 11/04/2014  . Hyperkalemia 11/01/2014  . Hypoglycemia  08/09/2014  . Type 2 diabetes mellitus with complication, with long-term current use of insulin ([place]) 08/08/2014  . Elevated troponin 08/08/2014  . Nausea vomiting and diarrhea 08/08/2014  . 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 Dizziness 08/06/2012  . Orthostatic hypotension 08/06/2012  . Hypernatremia 08/06/2012  . Acute gastroenteritis 08/13/2011  . Gastroparesis 08/13/2011  . Low back pain 08/13/2011  .  Oral thrush 08/13/2011  . Gastroenteritis 05/23/2011  . Dehydration 05/23/2011  . Blindness 05/23/2011  . DM type 1, not at goal, causing eye disease ([place]) 05/23/2011  . Asthma 05/23/2011  . Diarrhea 05/23/2011  . Vomiting 05/23/2011  . Hypothyroidism 05/23/2011  . Diabetic neuropathy ([place]) 05/23/2011  . Diabetic nephropathy ([place]) 05/23/2011   Past Medical History:  Diagnosis Date  . Arthritis   . Asthma   . Blind   . Blindness and low vision    left eye glass eye,  legally blind in right eye  . Diabetes mellitus   . Diabetic neuropathy ([place])   . Hyperlipidemia   . Hypertension   . Hypothyroidism   . Spinal stenosis     Family History  Problem Relation Age of Onset  . Diabetes Sister   . Glaucoma Sister   . Hypertension Sister   . Healthy Mother   . Healthy Father   . Stroke Brother   . Heart attack Paternal Aunt   . Breast cancer Neg Hx     Past Surgical History:  Procedure Laterality Date  . ABDOMINAL HYSTERECTOMY  1990  . [place]   x 2  . CHOLECYSTECTOMY  2008  . ENUCLEATION Bilateral 09/15/1998  . EYE SURGERY  2016   fitting artificial eye   Social History   Occupational History  . Occupation: N/A    Comment: disabled  Tobacco Use  . Smoking status: Former Smoker    Types: Cigarettes    Quit date: 05/22/1992    Years since quitting: 28.3  . Smokeless tobacco: Never Used  Vaping Use  . Vaping Use: Never used  Substance and Sexual Activity  . Alcohol use: No    Comment: quit 20 yrs ago  . Drug use: No  . Sexual activity: Not Currently

## 2020-10-01 ENCOUNTER — Other Ambulatory Visit: Payer: Self-pay | Admitting: *Deleted

## 2020-10-01 NOTE — Patient Outreach (Signed)
[facility]) Care Management  10/01/2020  Amy [name] 1960/10/11 [id number]  Successful telephone outreach call to patient. HIPAA identifiers obtained. Patient informed nurse that she has changed her PCP to Dr. Rochel Brome who is not in the [facility] network. As a result, nurse informed the patient that she would not be able to continue telephone outreaches to her because she does need to work underneath a provider. Patient verbalized understanding and was appreciative for the assistance she did receive from this nurse previously.  Plan: Case Closure due to patient's PCP is not in the [facility] network.   [name] RN, [place] [phone] [name]

## 2020-10-05 ENCOUNTER — Other Ambulatory Visit: Payer: Self-pay | Admitting: Family Medicine

## 2020-10-05 ENCOUNTER — Ambulatory Visit
Admission: RE | Admit: 2020-10-05 | Discharge: 2020-10-05 | Disposition: A | Discharge status: Home / Self Care | Payer: 59 | Source: Ambulatory Visit | Attending: Family Medicine | Admitting: Family Medicine

## 2020-10-05 DIAGNOSIS — S8992XA Unspecified injury of left lower leg, initial encounter: Secondary | ICD-10-CM

## 2020-10-05 DIAGNOSIS — R52 Pain, unspecified: Secondary | ICD-10-CM

## 2020-10-08 NOTE — Progress Notes (Deleted)
[name] DOB: 04-Feb-1961  REASON FOR VISIT: follow up HISTORY FROM: patient  No chief complaint on file.    HISTORY OF PRESENT ILLNESS: 10/08/20 ALL: She returns for follow up for OSA on CPAP.      10/09/2019 ALL: Amy [name] is a 60 y.o. female here today for follow up of OSA on CPAP. She is doing well. She has had some concerns of congestion and allergies over the past week. She feels that CPAP is hard to use when this happens. She feels that pressure is too strong for her. She usually tolerates CPAP well and denies any persistent concerns.   Compliance report dated 09/08/2019 through 10/07/2019 reveals that she used CPAP 29 of the past 30 days for compliance of 97%.  She used CPAP greater than 4 hours 23 of the past 30 days for compliance of 77%.  Average usage was 5 hours and 46 minutes.  Residual AHI was 4.1 on 8 cm of water and an EPR of 3.  There was no significant leak noted.  HISTORY: (copied from [employer] note on 09/01/2017)  Amy [name] is a 60 year old right-handed woman with an underlying medical history of diabetes, blindness (with L prosthetic eye and R eye blindness d/t diabetes), hyperlipidemia, asthma, arthritis, spinal stenosis, thyroid disease, nephropathy, orthostatic hypotension deemed secondary to diabetic autonomic neuropathy (seen by Dr. Leta Baptist in 9/17), and morbid obesity, who presents for follow up consultation of her sleep apnea, after recent sleep study testing. The patient is unaccompanied today. I first met her on 09/22/16, at the request of her PCP, at which time she reported snoring and excessive daytime somnolence. I invited her for a sleep study. She had a baseline sleep study, followed by a CPAP titration study. I went over her test results with her in detail today. Baseline sleep study from 10/02/2016 showed a sleep efficiency of 84.8%, sleep latency of 18.5 minutes, REM latency of 72.5 minutes, she had absence of slow-wave  sleep, an increased percentage of stage II sleep, REM sleep at 16.8%. Total AHI was 6.8 per hour, REM AHI 28 per hour, supine sleep was nearly absent. Average oxygen saturation was 95%, nadir was 78%, time below 89% saturation of 24 minutes. Based on her sleep-related complaints and significant desaturations noted during REM sleep I suggested she return for a full night CPAP titration study. She had this on 10/18/2016, sleep efficiency was 85.4%, sleep latency of 34 minutes, REM latency was 194 minutes. She had slow-wave sleep at 7.8%, REM sleep at 12.1%. She was fitted with medium nasal pillows and CPAP was titrated from 5 cm to 9 cm. On the final pressure her AHI was 0 per hour, nonsupine REM sleep was achieved and alternated was 86%. I suggested a home CPAP pressure of 10 cm.  12/29/16 Dr. Rexene Alberts I reviewed her CPAP compliance data from 11/28/16 to 12/27/16, which is a total of 30 days, during which time she used her CPAP 26 days, with percent used days greater than 4 hours of 63%, indicating suboptimal compliance, average usage of 5 hours and 27 minutes, average AHI of 3/hour, leak low with the 95th percentile of 2.8 lpm on a pressure of 10 cm with EPR of 3  UPDATE  09/27/2018CM  Ms.  Amy [name], 60 year old  Female returns for follow-up with a history of obstructive sleep apnea on CPAP Compliance data dated 01/10/2017 through 02/08/2017 shows greater than 4 hours at 90% for 27  Days. Average usage 6  hours 37  Minutes.  10 cm of pressure. EPR 3. AHI 2.9AHI 2.9. Leak low  with the 95th percentile.  She reports less daytime drowiness. She has a new caregiver  who requires education on cleaning and handling the CPAP equipment. She returns for reevaluation  UPDATE 4/24/2019CM Amy [name], 60 year old female returns for follow-up with a history of  obstructive sleep apnea on CPAP.  She is not having any problems with her machine she denies any air leak from her mask.  She reports the last few days  she has been stuffy and  not use the machine, she has allergies.  CPAP compliance dated 08/05/2017-09/03/2017 showed compliance greater than 4 hours 83% for 25 days.  Average usage 6 hours 38.  Set pressure 8 cm.  AHI 3.6 leaks 95 percentile at 2.4.  She has a caregiver 4 hours a day from 10-2 the patient is blind.  She returns for reevaluation of    REVIEW OF SYSTEMS: Out of a complete 14 system review of symptoms, the patient complains only of the following symptoms, none and all other reviewed systems are negative.   ALLERGIES: Allergies  Allergen Reactions  . Penicillins Hives and Swelling    Has patient had a PCN reaction causing immediate rash, facial/tongue/throat swelling, SOB or lightheadedness with hypotension: yes- face swelling Has patient had a PCN reaction causing severe rash involving mucus membranes or skin necrosis: no Has patient had a PCN reaction that required hospitalization unknown (childhood allergy) Has patient had a PCN reaction occurring within the last 10 years: no If all of the above answers are "NO", then may proceed with Cephalosporin use.   . Tramadol Nausea And Vomiting    Intense nausea  . Vicodin [Hydrocodone-Acetaminophen] Nausea And Vomiting  . Codeine Nausea Only  . Iodine-131 Hives  . Iohexol Hives    Pt developed itching and hives along with nasal congestion; needs 13 hour premeds for future studies, Onset Date: [id number]   . Lisinopril Cough  . Sulfa Antibiotics Nausea And Vomiting    HOME MEDICATIONS: Outpatient Medications Prior to Visit  Medication Sig Dispense Refill  . albuterol (PROVENTIL HFA;VENTOLIN HFA) 108 (90 BASE) MCG/ACT inhaler Inhale 2 puffs into the lungs every 4 (four) hours as needed for wheezing or shortness of breath. For short    . aspirin EC 81 MG tablet Take 81 mg by mouth daily.    . blood glucose meter kit and supplies Dispense based on patient and insurance preference. Use up to four times daily as directed. (FOR ICD-10 E10.9, E11.9). 1 each 0   . cetirizine (ZYRTEC) 10 MG tablet Take 10 mg by mouth daily.    . Continuous Blood Gluc Receiver (FREESTYLE LIBRE 2 READER) DEVI As instructed. iddm with complications - retinopathy, neuropathy, nephropathy. With device and sensors. 1 each 0  . cyclobenzaprine (FLEXERIL) 5 MG tablet 1 pill by mouth up to every 8 hours as needed. Start with one pill by mouth each bedtime as needed due to sedation 15 tablet 0  . Dulaglutide (TRULICITY) 1.5 YQ/0.3KV SOPN Inject 1.5 mg into the skin once a week. 6 mL 2  . EMBRACE TALK GLUCOSE TEST test strip USE TO CHECK BLOOD SUGARS FOUR TIMES A DAY BEFORE MEALS AND AT BEDTIME DX E11.29 300 strip 3  . esomeprazole (NEXIUM) 40 MG capsule Take 1 capsule (40 mg total) by mouth daily. 30 capsule 0  . gabapentin (NEURONTIN) 300 MG capsule Take 1 capsule (300 mg total) by mouth 3 (three)  times daily. 90 capsule 3  . HUMALOG KWIKPEN 100 UNIT/ML KwikPen Max Daily 30 units 30 mL 3  . Insulin Pen Needle (PEN NEEDLES) 31G X 8 MM MISC Inject 1 Device into the skin in the morning, at noon, in the evening, and at bedtime. 400 each 3  . levothyroxine (SYNTHROID) 150 MCG tablet Take 1 tablet (150 mcg total) by mouth daily. Needs appt for future refills 90 tablet 0  . linaclotide (LINZESS) 145 MCG CAPS capsule Take 145 mcg by mouth daily as needed (constipation).     . naproxen (NAPROSYN) 500 MG tablet Take 500 mg by mouth 2 (two) times daily with a meal.    . ondansetron (ZOFRAN ODT) 4 MG disintegrating tablet Take 1 tablet (4 mg total) by mouth every 8 (eight) hours as needed for nausea or vomiting. 10 tablet 0  . potassium chloride SA (KLOR-CON) 20 MEQ tablet Take 1 tablet (20 mEq total) by mouth daily. Need appt for future refills 90 tablet 0  . simvastatin (ZOCOR) 20 MG tablet Take 1 tablet (20 mg total) by mouth every evening. 90 tablet 0  . spironolactone (ALDACTONE) 25 MG tablet Take 0.5 tablets (12.5 mg total) by mouth daily. Needs appt for future refills 15 tablet 0  .  torsemide (DEMADEX) 20 MG tablet Take 4 tablets (80 mg total) by mouth daily. Need appt for future refills 120 tablet 0  . TRESIBA FLEXTOUCH 200 UNIT/ML FlexTouch Pen Inject 22 Units into the skin daily. 15 mL 4   No facility-administered medications prior to visit.    PAST MEDICAL HISTORY: Past Medical History:  Diagnosis Date  . Arthritis   . Asthma   . Blind   . Blindness and low vision    left eye glass eye,  legally blind in right eye  . Diabetes mellitus   . Diabetic neuropathy ([place])   . Hyperlipidemia   . Hypertension   . Hypothyroidism   . Spinal stenosis     PAST SURGICAL HISTORY: Past Surgical History:  Procedure Laterality Date  . ABDOMINAL HYSTERECTOMY  1990  . [facility]   x 2  . CHOLECYSTECTOMY  2008  . ENUCLEATION Bilateral 09/15/1998  . EYE SURGERY  2016   fitting artificial eye    FAMILY HISTORY: Family History  Problem Relation Age of Onset  . Diabetes Sister   . Glaucoma Sister   . Hypertension Sister   . Healthy Mother   . Healthy Father   . Stroke Brother   . Heart attack Paternal Aunt   . Breast cancer Neg Hx     SOCIAL HISTORY: Social History   Socioeconomic History  . Marital status: Single    Spouse name: Not on file  . Number of children: 2  . Years of education: 20  . Highest education level: Not on file  Occupational History  . Occupation: N/A    Comment: disabled  Tobacco Use  . Smoking status: Former Smoker    Types: Cigarettes    Quit date: 05/22/1992    Years since quitting: 28.4  . Smokeless tobacco: Never Used  Vaping Use  . Vaping Use: Never used  Substance and Sexual Activity  . Alcohol use: No    Comment: quit 20 yrs ago  . Drug use: No  . Sexual activity: Not Currently  Other Topics Concern  . Not on file  Social History Narrative   Lives alone   caffeine drinks 2 cups of coffee a day,  occasional soda    Social Determinants of Health   Financial Resource Strain: Not on file  Food  Insecurity: Not on file  Transportation Needs: Not on file  Physical Activity: Not on file  Stress: Not on file  Social Connections: Not on file  Intimate Partner Violence: Not on file      PHYSICAL EXAM  There were no vitals filed for this visit. There is no height or weight on file to calculate BMI.  Generalized: Well developed, in no acute distress  Cardiology: normal rate and rhythm, no murmur noted Respiratory: clear to auscultation bilaterally  Neurological examination  Mentation: Alert oriented to time, place, history taking. Follows all commands speech and language fluent Cranial nerve II-XII: Patient is blind. Facial sensation and strength were normal. Head turning and shoulder shrug  were normal and symmetric. Motor: The motor testing reveals 5 over 5 strength of all 4 extremities.  Gait and station: in wheelchair today   DIAGNOSTIC DATA (LABS, IMAGING, TESTING) - I reviewed patient records, labs, notes, testing and imaging myself where available.  No flowsheet data found.   Lab Results  Component Value Date   WBC 11.6 (H) 04/23/2020   HGB 11.5 (L) 04/23/2020   HCT 36.9 04/23/2020   MCV 80.9 04/23/2020   PLT 213 04/23/2020      Component Value Date/Time   NA 145 (H) 06/08/2020 1543   K 4.6 06/08/2020 1543   CL 107 (H) 06/08/2020 1543   CO2 26 06/08/2020 1543   GLUCOSE 63 (L) 06/08/2020 1543   GLUCOSE 232 (H) 04/23/2020 1158   BUN 44 (H) 06/08/2020 1543   CREATININE 1.49 (H) 06/08/2020 1543   CREATININE 0.92 10/22/2012 1351   CALCIUM 9.9 06/08/2020 1543   PROT 7.7 04/13/2020 1150   ALBUMIN 4.0 04/13/2020 1150   AST 11 04/13/2020 1150   ALT 14 04/13/2020 1150   ALKPHOS 139 (H) 04/13/2020 1150   BILITOT 0.2 04/13/2020 1150   GFRNONAA 38 (L) 06/08/2020 1543   GFRNONAA 30 (L) 04/23/2020 1158   GFRAA 43 (L) 06/08/2020 1543   Lab Results  Component Value Date   CHOL 126 04/13/2020   HDL 42 04/13/2020   LDLCALC 66 04/13/2020   TRIG 95 04/13/2020    CHOLHDL 3.0 04/13/2020   Lab Results  Component Value Date   HGBA1C 7.6 (A) 09/18/2020   Lab Results  Component Value Date   VITAMINB12 325 05/25/2011   Lab Results  Component Value Date   TSH 2.270 04/13/2020       ASSESSMENT AND PLAN 60 y.o. year old female  has a past medical history of Arthritis, Asthma, Blind, Blindness and low vision, Diabetes mellitus, Diabetic neuropathy ([place]), Hyperlipidemia, Hypertension, Hypothyroidism, and Spinal stenosis. here with   No diagnosis found.   She is doing well on CPAP therapy.  Compliance report reveals excellent daily compliance and acceptable 4-hour compliance.  She has noted some difficulty with pressure settings over the past week.  She contributes this to having more allergy symptoms with congestion.  She does not usually have any concerns with her pressure settings.  I have asked that she monitor this closely.  We may adjust pressure settings if needed in the future.  I am hopeful that once allergy symptoms resolve she will no longer have any concerns with CPAP.  She was encouraged to continue using CPAP nightly and for greater than 4 hours each night.  She will continue close follow-up with her healthcare team.  She will  follow-up with me in 1 year, sooner if needed.  She verbalizes understanding and agreement with this plan.   No orders of the defined types were placed in this encounter.    No orders of the defined types were placed in this encounter.     I spent 15 minutes with the patient. 50% of this time was spent counseling and educating patient on plan of care and medications.    Debbora Presto, FNP-C 10/08/2020, 4:44 PM [facility] [address], [place], [place] [address] [phone]

## 2020-10-13 ENCOUNTER — Ambulatory Visit: Payer: Medicare Other | Admitting: Family Medicine

## 2020-10-13 DIAGNOSIS — G4733 Obstructive sleep apnea (adult) (pediatric): Secondary | ICD-10-CM

## 2020-10-14 ENCOUNTER — Telehealth: Payer: Self-pay | Admitting: Nutrition

## 2020-10-14 DIAGNOSIS — E11649 Type 2 diabetes mellitus with hypoglycemia without coma: Secondary | ICD-10-CM

## 2020-10-14 NOTE — Telephone Encounter (Signed)
Message left on my phone that blood sugars are still running high--mid to upper 200s. Phoned patient and said there was no reason for the high blood sugars--not sick or no change in medications.  Please advise. [employer] and put on Dr. Quin Hoop desk

## 2020-10-16 NOTE — Telephone Encounter (Signed)
I took a look at the [employer], the good news is she has not had any low glucose overnight anymore.  Her low BG's tend to be during the day, but at times she is having severe hyperglycemia with BG's in the 300s.  I suspect this patient has insulin to carbohydrate mismatch and it may be a good idea to bring her in for a visit to discuss the importance of carbohydrate consistency with each meal as she is on a set dose of prandial insulin.  I will not be changing her prandial insulin at this point because there is no specific pattern the main issue is that she stopped having hypoglycemic episode at night which used to be a common theme for her.

## 2020-10-19 NOTE — Telephone Encounter (Signed)
Message left on machine of the what Dr. Kelton Pillar said below, and that can try to work her in tomorrow or Wednesday for review in meal time insulin/carb counting.

## 2020-10-20 NOTE — Telephone Encounter (Signed)
I have scheduled her for next Wednesday.  Please put in a dietary referral for carb counting.  Thank you

## 2020-10-21 NOTE — Addendum Note (Signed)
Addended by: Dorita Sciara on: 10/21/2020 09:36 AM   Modules accepted: Orders

## 2020-10-26 ENCOUNTER — Ambulatory Visit (HOSPITAL_COMMUNITY)
Admission: EM | Admit: 2020-10-26 | Discharge: 2020-10-26 | Disposition: A | Discharge status: Home / Self Care | Payer: 59 | Attending: Physician Assistant | Admitting: Physician Assistant

## 2020-10-26 ENCOUNTER — Ambulatory Visit (INDEPENDENT_AMBULATORY_CARE_PROVIDER_SITE_OTHER): Payer: 59

## 2020-10-26 ENCOUNTER — Other Ambulatory Visit: Payer: Self-pay

## 2020-10-26 ENCOUNTER — Ambulatory Visit (INDEPENDENT_AMBULATORY_CARE_PROVIDER_SITE_OTHER): Payer: 59 | Admitting: Podiatry

## 2020-10-26 ENCOUNTER — Encounter: Payer: Self-pay | Admitting: Podiatry

## 2020-10-26 ENCOUNTER — Encounter (HOSPITAL_COMMUNITY): Payer: Self-pay

## 2020-10-26 DIAGNOSIS — M79602 Pain in left arm: Secondary | ICD-10-CM

## 2020-10-26 DIAGNOSIS — B351 Tinea unguium: Secondary | ICD-10-CM

## 2020-10-26 DIAGNOSIS — M79676 Pain in unspecified toe(s): Secondary | ICD-10-CM | POA: Diagnosis not present

## 2020-10-26 DIAGNOSIS — M25532 Pain in left wrist: Secondary | ICD-10-CM

## 2020-10-26 DIAGNOSIS — M25522 Pain in left elbow: Secondary | ICD-10-CM

## 2020-10-26 DIAGNOSIS — M25422 Effusion, left elbow: Secondary | ICD-10-CM

## 2020-10-26 DIAGNOSIS — L84 Corns and callosities: Secondary | ICD-10-CM

## 2020-10-26 DIAGNOSIS — E1142 Type 2 diabetes mellitus with diabetic polyneuropathy: Secondary | ICD-10-CM

## 2020-10-26 DIAGNOSIS — R9389 Abnormal findings on diagnostic imaging of other specified body structures: Secondary | ICD-10-CM

## 2020-10-26 NOTE — Discharge Instructions (Addendum)
Your x-ray is abnormal and you likely need an MRI for follow-up.  Unfortunately, this is not something we can do in urgent care.  Please contact your primary care provider to see if they can order this.  I have also given you the contact information for orthopedics and would like you to call them ASAP.  If anything worsens you need to go to the emergency room as we discussed.

## 2020-10-26 NOTE — ED Triage Notes (Signed)
Pt presents with left arm pain and neck pain x 4 days; tingling sensation in left elbow x 4 days. Tylenol gives no relief. Denies fall, chest pain, nausea, vomiting, abdominal pain, diarrhea.

## 2020-10-26 NOTE — ED Provider Notes (Signed)
[place]    CSN: [id number] Arrival date & time: 10/26/20  1144      History   Chief Complaint Chief Complaint  Patient presents with  . Arm Pain  . Neck Pain    HPI Amy [name] is a 60 y.o. female.   Patient presents today with a several day history of worsening left arm pain.  She reports pain is generalized throughout left arm but is worse at her elbow and wrist.  She denies any known injury or increase in activity prior to symptom onset but reports that she does have a hospital bed at home and often depends on her left arm to get out of bed during the day and wonders if she might of injured it.  Pain is rated 10 on a 0-10 pain scale, localized to left shoulder/left elbow/left wrist with radiation throughout arm, described as aching, no aggravating relieving factors identified.  She has not tried any over-the-counter medication for symptom management.  She does report some numbness but states this is chronic related to cervical degenerative disc disease.  Patient reports known history of degenerative disc disease states current symptoms are not similar to previous episodes of this condition.  Last x-ray obtained 06/15/2020 showed solid bridging osteophytes from C2-C7 with cervical foraminal stenosis and probable canal stenosis.  Patient denies any significant neck pain at this time is unsure if this is related to ongoing symptoms.  She has not seen orthopedics and is open to referral.   X-ray obtained 06/15/2020 showed solid bridging osteophytes from C2-C7 with cervical foraminal stenosis and probable canal stenosis.  Past Medical History:  Diagnosis Date  . Arthritis   . Asthma   . Blind   . Blindness and low vision    left eye glass eye,  legally blind in right eye  . Diabetes mellitus   . Diabetic neuropathy ([place])   . Hyperlipidemia   . Hypertension   . Hypothyroidism   . Spinal stenosis     Patient Active Problem List   Diagnosis Date Noted  . Type 2  diabetes mellitus with diabetic polyneuropathy, with long-term current use of insulin ([place]) 06/29/2020  . 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 Type 2 diabetes mellitus with hyperglycemia, with long-term current use of insulin ([place]) 06/29/2020  . 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 Pre-operative cardiovascular examination 02/18/2019  . Septic bursitis of elbow, right 10/30/2018  . CRI (chronic renal insufficiency), stage 3 (moderate) 07/26/2018  . Morbid obesity ([place]) 07/26/2018  . Intractable nausea and vomiting 03/06/2018  . Right leg pain 12/19/2017  . Leukocytosis 12/19/2017  . Benign positional vertigo 12/19/2017  . Weakness 12/12/2017  . Laryngopharyngeal reflux (LPR) 10/19/2017  . Post-nasal drainage 10/19/2017  . Acute sinusitis 10/19/2017  . Degeneration of lumbar intervertebral disc 07/29/2017  . 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 Obstructive sleep apnea on CPAP 02/09/2017  . Chronic diastolic heart failure ([place]) 11/08/2016  . Near syncope 01/30/2016  . SOB (shortness of breath)   . CAP (community acquired pneumonia) 09/12/2015  . Hypoxia 09/12/2015  . Essential hypertension 07/05/2015  . Hypotension 07/05/2015  . Diabetes mellitus with neurological manifestations ([place])  11/04/2014  . Hyperlipidemia LDL goal <70 11/04/2014  . Spinal stenosis, multilevel 11/04/2014  . Hyperkalemia 11/01/2014  . Hypoglycemia 08/09/2014  . Type 2 diabetes mellitus with complication, with long-term current use of insulin ([place]) 08/08/2014  . Elevated troponin 08/08/2014  . Nausea vomiting and diarrhea 08/08/2014  . 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 Dizziness 08/06/2012  . Orthostatic hypotension 08/06/2012  . Hypernatremia 08/06/2012  . Acute gastroenteritis 08/13/2011  . Gastroparesis 08/13/2011  . Low back pain 08/13/2011  . Oral thrush 08/13/2011  . Gastroenteritis 05/23/2011  . Dehydration 05/23/2011  . Blindness 05/23/2011  . DM type 1, not at goal, causing eye disease ([place]) 05/23/2011  . Asthma 05/23/2011  . Diarrhea 05/23/2011  . Vomiting 05/23/2011  . Hypothyroidism 05/23/2011  . Diabetic neuropathy ([place]) 05/23/2011  . Diabetic nephropathy ([place]) 05/23/2011    Past Surgical History:  Procedure Laterality Date  . ABDOMINAL HYSTERECTOMY  1990  . [place]   x 2  . CHOLECYSTECTOMY  2008  . ENUCLEATION Bilateral 09/15/1998  . EYE SURGERY  2016   fitting artificial eye    OB History   No obstetric history on file.    Obstetric Comments  Pt has two sons.          Home Medications    Prior to Admission medications   Medication Sig Start Date End Date Taking? Authorizing Provider  albuterol (PROVENTIL HFA;VENTOLIN HFA) 108 (90 BASE) MCG/ACT inhaler Inhale 2 puffs into the lungs every 4 (four) hours as needed for wheezing or shortness of breath. For short    [provider]  aspirin EC 81 MG tablet Take 81 mg by mouth daily.    [provider]  blood glucose meter kit and supplies Dispense based on patient and insurance preference. Use up to four times daily as directed. (FOR ICD-10 E10.9, E11.9). 08/06/20   Wendie Agreste, MD  cetirizine (ZYRTEC) 10 MG tablet Take 10 mg by mouth daily.    [provider]  Continuous Blood Gluc Receiver (FREESTYLE LIBRE 2 READER) DEVI As instructed. iddm with complications - retinopathy, neuropathy, nephropathy. With device and sensors. 08/06/20   Wendie Agreste, MD  cyclobenzaprine (FLEXERIL) 5 MG tablet 1 pill by mouth up to every 8 hours as needed. Start with  one pill by mouth each bedtime as needed due to sedation 07/10/20   Wendie Agreste, MD  Dulaglutide (TRULICITY) 1.5 YI/9.4WN SOPN Inject 1.5 mg into the skin once a week. 09/18/20   Shamleffer, Melanie Crazier, MD  EMBRACE TALK GLUCOSE TEST test strip USE TO CHECK BLOOD SUGARS FOUR TIMES A DAY BEFORE MEALS AND AT BEDTIME DX E11.29 07/29/20   Wendie Agreste, MD  esomeprazole (NEXIUM) 40 MG capsule Take 1 capsule (40 mg total) by mouth daily. 07/10/20   Bensimhon, Shaune Pascal, MD  gabapentin (NEURONTIN) 300 MG capsule Take 1 capsule (300 mg total) by mouth 3 (three) times daily. 06/23/20   Hilts, Legrand Como, MD  HUMALOG KWIKPEN 100 UNIT/ML KwikPen Max Daily 30 units 09/18/20   Shamleffer, Melanie Crazier, MD  Insulin Pen Needle (PEN NEEDLES) 31G X 8 MM MISC Inject 1 Device into the skin in the morning, at noon, in the evening, and at bedtime. 07/01/20   Shamleffer, Melanie Crazier, MD  levothyroxine (SYNTHROID) 150 MCG tablet Take 1 tablet (150 mcg total) by mouth daily. Needs appt for future refills 08/06/20   Wendie Agreste, MD  linaclotide (  LINZESS) 145 MCG CAPS capsule Take 145 mcg by mouth daily as needed (constipation).     [provider]  naproxen (NAPROSYN) 500 MG tablet Take 500 mg by mouth 2 (two) times daily with a meal.    [provider]  ondansetron (ZOFRAN ODT) 4 MG disintegrating tablet Take 1 tablet (4 mg total) by mouth every 8 (eight) hours as needed for nausea or vomiting. 07/09/19   Antonietta Breach, PA-C  potassium chloride SA (KLOR-CON) 20 MEQ tablet Take 1 tablet (20 mEq total) by mouth daily. Need appt for future refills 08/06/20   Wendie Agreste, MD  Prodigy Twist Top Lancets 28G MISC USE TO CHECK BLOOD SUGARS 10/02/20   [provider]  simvastatin (ZOCOR) 20 MG tablet Take 1 tablet (20 mg total) by mouth every evening. 08/06/20   Wendie Agreste, MD  spironolactone (ALDACTONE) 25 MG tablet Take 0.5 tablets (12.5 mg total) by mouth daily. Needs appt for future  refills 08/06/20   Wendie Agreste, MD  tiZANidine (ZANAFLEX) 2 MG tablet Take 2 mg by mouth 2 (two) times daily. 10/02/20   [provider]  torsemide (DEMADEX) 20 MG tablet Take 4 tablets (80 mg total) by mouth daily. Need appt for future refills 08/06/20   Wendie Agreste, MD  TRESIBA FLEXTOUCH 200 UNIT/ML FlexTouch Pen Inject 22 Units into the skin daily. 09/18/20   Shamleffer, Melanie Crazier, MD    Family History Family History  Problem Relation Age of Onset  . Diabetes Sister   . Glaucoma Sister   . Hypertension Sister   . Healthy Mother   . Healthy Father   . Stroke Brother   . Heart attack Paternal Aunt   . Breast cancer Neg Hx     Social History Social History   Tobacco Use  . Smoking status: Former    Pack years: 0.00    Types: Cigarettes    Quit date: 05/22/1992    Years since quitting: 28.4  . Smokeless tobacco: Never  Vaping Use  . Vaping Use: Never used  Substance Use Topics  . Alcohol use: No    Comment: quit 20 yrs ago  . Drug use: No     Allergies   Penicillins, Tramadol, Vicodin [hydrocodone-acetaminophen], Codeine, Iodine-131, Iohexol, Lisinopril, and Sulfa antibiotics   Review of Systems Review of Systems  Constitutional:  Positive for activity change. Negative for appetite change, fatigue and fever.  Respiratory:  Negative for cough and shortness of breath.   Cardiovascular:  Negative for chest pain.  Gastrointestinal:  Negative for abdominal pain, diarrhea, nausea and vomiting.  Musculoskeletal:  Positive for arthralgias and joint swelling. Negative for myalgias.  Neurological:  Negative for dizziness, weakness, light-headedness, numbness and headaches.    Physical Exam Triage Vital Signs ED Triage Vitals  Enc Vitals Group     BP 10/26/20 1239 (!) 118/7     Pulse Rate 10/26/20 1239 77     Resp 10/26/20 1239 20     Temp 10/26/20 1239 99.9 F (37.7 C)     Temp Source 10/26/20 1239 Oral     SpO2 10/26/20 1239 97 %     Weight --       Height --      Head Circumference --      Peak Flow --      Pain Score 10/26/20 1237 10     Pain Loc --      Pain Edu? --      Excl.  in [place]? --    No data found.  Updated Vital Signs BP 118/70   Pulse 77   Temp 99.9 F (37.7 C) (Oral)   Resp 20   SpO2 97%   Visual Acuity Right Eye Distance:   Left Eye Distance:   Bilateral Distance:    Right Eye Near:   Left Eye Near:    Bilateral Near:     Physical Exam Vitals reviewed.  Constitutional:      General: She is awake. She is not in acute distress.    Appearance: Normal appearance. She is normal weight. She is not ill-appearing.     Comments: Very pleasant female appears stated age in no acute distress sitting comfortably in exam room  HENT:     Head: Normocephalic and atraumatic.  Cardiovascular:     Rate and Rhythm: Normal rate and regular rhythm.     Pulses:          Radial pulses are 2+ on the right side and 2+ on the left side.     Heart sounds: Normal heart sounds, S1 normal and S2 normal. No murmur heard.    Comments: Capillary refill within 2 seconds bilateral hands Pulmonary:     Effort: Pulmonary effort is normal.     Breath sounds: Normal breath sounds. No wheezing, rhonchi or rales.     Comments: Clear to auscultation bilaterally Abdominal:     Palpations: Abdomen is soft.     Tenderness: There is no abdominal tenderness.  Musculoskeletal:     Left shoulder: Tenderness present. No swelling, deformity or bony tenderness. Decreased range of motion. Normal strength.     Left elbow: Swelling present. No effusion. Decreased range of motion. Tenderness present in lateral epicondyle.     Left wrist: Swelling, tenderness and bony tenderness present. No snuff box tenderness. Decreased range of motion.     Comments: Left arm: Decreased range of motion with extension, abduction, forward flexion.  No deformity noted.  Tenderness palpation posterior left shoulder.  Negative empty can and drop arm.  Patient does have  swelling and tenderness palpation over lateral epicondyle.  Decreased range of motion with extension at elbow secondary to pain.  Decreased range of motion with flexion extension of wrist.  Significant swelling noted at wrist with tenderness palpation dorsal wrist.  Hand neurovascularly intact.  Decreased grip strength with left hand secondary to pain.  Psychiatric:        Behavior: Behavior is cooperative.     UC Treatments / Results  Labs (all labs ordered are listed, but only abnormal results are displayed) Labs Reviewed - No data to display  EKG   Radiology DG Elbow Complete Left  Result Date: 10/26/2020 CLINICAL DATA:  Pain and swelling with decreased range of motion EXAM: LEFT ELBOW - COMPLETE 3+ VIEW COMPARISON:  None. FINDINGS: Frontal, oblique, and lateral views were obtained. There is soft tissue swelling. There is no acute fracture or dislocation evident. There is a sizable joint effusion, however. There is a subtle lucency measuring just over 2 mm along the posterior aspect of the proximal most portion of the ulna. No similar areas of questionable lytic change noted. No appreciable joint space narrowing. IMPRESSION: 1.  Large joint effusion of uncertain etiology. 2. Small lytic appearing area along the posterior most aspect of the proximal ulna. Etiology for this small area of lucency uncertain. This finding coupled with the joint effusion may warrant elbow MR to further evaluate. 3. No fracture or dislocation evident  by radiography. No appreciable joint space narrowing. These results will be called to the ordering clinician or representative by the Radiologist Assistant, and communication documented in the PACS or [employer]. Electronically Signed   By: Lowella Grip III M.D.   On: 10/26/2020 13:37   DG Wrist Complete Left  Result Date: 10/26/2020 CLINICAL DATA:  Pain and swelling with decreased range of motion EXAM: LEFT WRIST - COMPLETE 3+ VIEW COMPARISON:  None.  FINDINGS: Frontal, oblique, and lateral views were obtained. No fracture or dislocation. Joint spaces appear normal. No erosive change. Occasional foci of atherosclerotic arterial vascular calcification noted. IMPRESSION: No fracture or dislocation.  No appreciable arthropathic change. Electronically Signed   By: Lowella Grip III M.D.   On: 10/26/2020 13:38    Procedures Procedures (including critical care time)  Medications Ordered in UC Medications - No data to display  Initial Impression / Assessment and Plan / UC Course  I have reviewed the triage vital signs and the nursing notes.  Pertinent labs & imaging results that were available during my care of the patient were reviewed by me and considered in my medical decision making (see chart for details).      X-ray of wrist and elbow obtained given effusion with significant tenderness palpation.  X-ray of wrist was normal but x-ray of elbow showed lytic lesion of unclear etiology with recommendation for MRI follow-up.  Unfortunately, we are unable to obtain MRIs in urgent care.  Encourage patient to contact PCP as well follow-up with orthopedics to arrange imaging.  She is diabetic and so unable to take prednisone.  She reports history of chronic kidney disease with last creatinine clearance in the 30 mL/min range so we will defer NSAIDs to prevent kidney injury.  She was recommended to take Tylenol as needed for pain relief.  Discussed alarm symptoms that warrant emergent evaluation.  Strict return precautions given to which patient expressed understanding.  Final Clinical Impressions(s) / UC Diagnoses   Final diagnoses:  Left arm pain  Left elbow pain  Left wrist pain  Elbow effusion, left  Abnormal x-ray     Discharge Instructions      Your x-ray is abnormal and you likely need an MRI for follow-up.  Unfortunately, this is not something we can do in urgent care.  Please contact your primary care provider to see if they can  order this.  I have also given you the contact information for orthopedics and would like you to call them ASAP.  If anything worsens you need to go to the emergency room as we discussed.     ED Prescriptions   None    PDMP not reviewed this encounter.   Terrilee Croak, PA-C 10/26/20 1358

## 2020-10-27 ENCOUNTER — Ambulatory Visit: Payer: 59 | Admitting: Nurse Practitioner

## 2020-10-28 ENCOUNTER — Encounter: Payer: 59 | Admitting: Nutrition

## 2020-10-30 NOTE — Progress Notes (Signed)
Subjective: Amy [name] is a pleasant 60 y.o. female patient seen todayfor at-risk foot care with h/o diabetic neuropathy. She is seen for preulcerative calluses b/l heels and  painful thick toenails that are difficult to trim. Pain interferes with ambulation. Aggravating factors include wearing enclosed shoe gear. Pain is relieved with periodic professional debridement.  Patient states she continues to wear her heel protectors at home when in bed. She also states she is having pain in her left elbow and is barely able to move it. She states she may go to Urgent Care for evaluation.  She notes no new pedal problems on today's visit.  PCP is Shamleffer, Melanie Crazier, MD. Last visit was: 09/18/2020.  Allergies  Allergen Reactions   Penicillins Hives and Swelling    Has patient had a PCN reaction causing immediate rash, facial/tongue/throat swelling, SOB or lightheadedness with hypotension: yes- face swelling Has patient had a PCN reaction causing severe rash involving mucus membranes or skin necrosis: no Has patient had a PCN reaction that required hospitalization unknown (childhood allergy) Has patient had a PCN reaction occurring within the last 10 years: no If all of the above answers are "NO", then may proceed with Cephalosporin use.    Tramadol Nausea And Vomiting    Intense nausea   Vicodin [Hydrocodone-Acetaminophen] Nausea And Vomiting   Codeine Nausea Only   Iodine-131 Hives   Iohexol Hives    Pt developed itching and hives along with nasal congestion; needs 13 hour premeds for future studies, Onset Date: [id number]    Lisinopril Cough   Sulfa Antibiotics Nausea And Vomiting    Objective: Physical Exam  General: [name] is a pleasant 59 y.o. African American female, morbidly obese in NAD. AAO x 3.   Vascular:  Capillary fill time to digits <3 seconds b/l lower extremities. Faintly palpable pedal pulses b/l. Pedal hair sparse. Lower extremity skin temperature  gradient within normal limits. No pain with calf compression b/l. Trace edema noted b/l lower extremities.  Dermatological:  Pedal skin with normal turgor, texture and tone bilaterally. No open wounds bilaterally. No interdigital macerations bilaterally. Toenails 1-5 b/l elongated, discolored, dystrophic, thickened, crumbly with subungual debris and tenderness to dorsal palpation. Hyperkeratotic lesion(s) posterior aspect of heel b/l feet.  No erythema, no edema, no drainage, no fluctuance.  Musculoskeletal:  Normal muscle strength 5/5 to all lower extremity muscle groups bilaterally. No pain crepitus or joint limitation noted with ROM b/l. Hallux valgus with bunion deformity noted b/l lower extremities. Hammertoe(s) noted to the 2-5 bilaterally.  Neurological:  Pt has subjective symptoms of neuropathy. Protective sensation intact 5/5 intact bilaterally with 10g monofilament b/l. Vibratory sensation intact b/l.  Assessment and Plan:  1. Pain due to onychomycosis of toenail   2. Callus   3. Diabetic peripheral neuropathy associated with type 2 diabetes mellitus ([place])      -Examined patient. -Continue diabetic foot care principles. -Patient to continue soft, supportive shoe gear daily. -Toenails 1-5 b/l were debrided in length and girth with sterile nail nippers and dremel without iatrogenic bleeding.  -Callus(es) posterior aspect of heel b/l feet pared utilizing sterile scalpel blade without complication or incident. Total number debrided =2. -Patient to report any pedal injuries to medical professional immediately. -Patient/POA to call should there be question/concern in the interim.  Return in about 3 months (around 01/26/2021).  Marzetta Board, DPM

## 2020-11-02 ENCOUNTER — Encounter: Payer: 59 | Admitting: Nutrition

## 2020-11-10 ENCOUNTER — Other Ambulatory Visit: Payer: Self-pay | Admitting: Family Medicine

## 2020-11-10 ENCOUNTER — Encounter: Payer: 59 | Admitting: Nutrition

## 2020-11-12 ENCOUNTER — Other Ambulatory Visit: Payer: Self-pay | Admitting: Family Medicine

## 2020-11-12 DIAGNOSIS — R936 Abnormal findings on diagnostic imaging of limbs: Secondary | ICD-10-CM

## 2020-11-24 ENCOUNTER — Ambulatory Visit
Admission: RE | Admit: 2020-11-24 | Discharge: 2020-11-24 | Disposition: A | Discharge status: Home / Self Care | Payer: 59 | Source: Ambulatory Visit | Attending: Family Medicine | Admitting: Family Medicine

## 2020-11-24 DIAGNOSIS — R936 Abnormal findings on diagnostic imaging of limbs: Secondary | ICD-10-CM

## 2020-11-25 ENCOUNTER — Other Ambulatory Visit: Payer: Self-pay

## 2020-11-25 ENCOUNTER — Encounter: Payer: 59 | Attending: Internal Medicine | Admitting: Nutrition

## 2020-11-25 DIAGNOSIS — E1065 Type 1 diabetes mellitus with hyperglycemia: Secondary | ICD-10-CM | POA: Diagnosis present

## 2020-11-25 DIAGNOSIS — E103521 Type 1 diabetes mellitus with proliferative diabetic retinopathy with traction retinal detachment involving the macula, right eye: Secondary | ICD-10-CM | POA: Insufficient documentation

## 2020-11-25 DIAGNOSIS — IMO0002 Reserved for concepts with insufficient information to code with codable children: Secondary | ICD-10-CM

## 2020-11-30 ENCOUNTER — Ambulatory Visit: Payer: Self-pay | Admitting: *Deleted

## 2020-12-02 ENCOUNTER — Other Ambulatory Visit: Payer: Self-pay | Admitting: Family Medicine

## 2020-12-02 IMAGING — DX RIGHT ELBOW - COMPLETE 3+ VIEW
5 series · 5 of 5 positions shown · non-contrast
Comparison: None.

CLINICAL DATA: 57 y/o  F; right elbow pain.  Redness and swelling.

EXAM:
RIGHT ELBOW - COMPLETE 3+ VIEW

[elbow ap (1 of 5)]
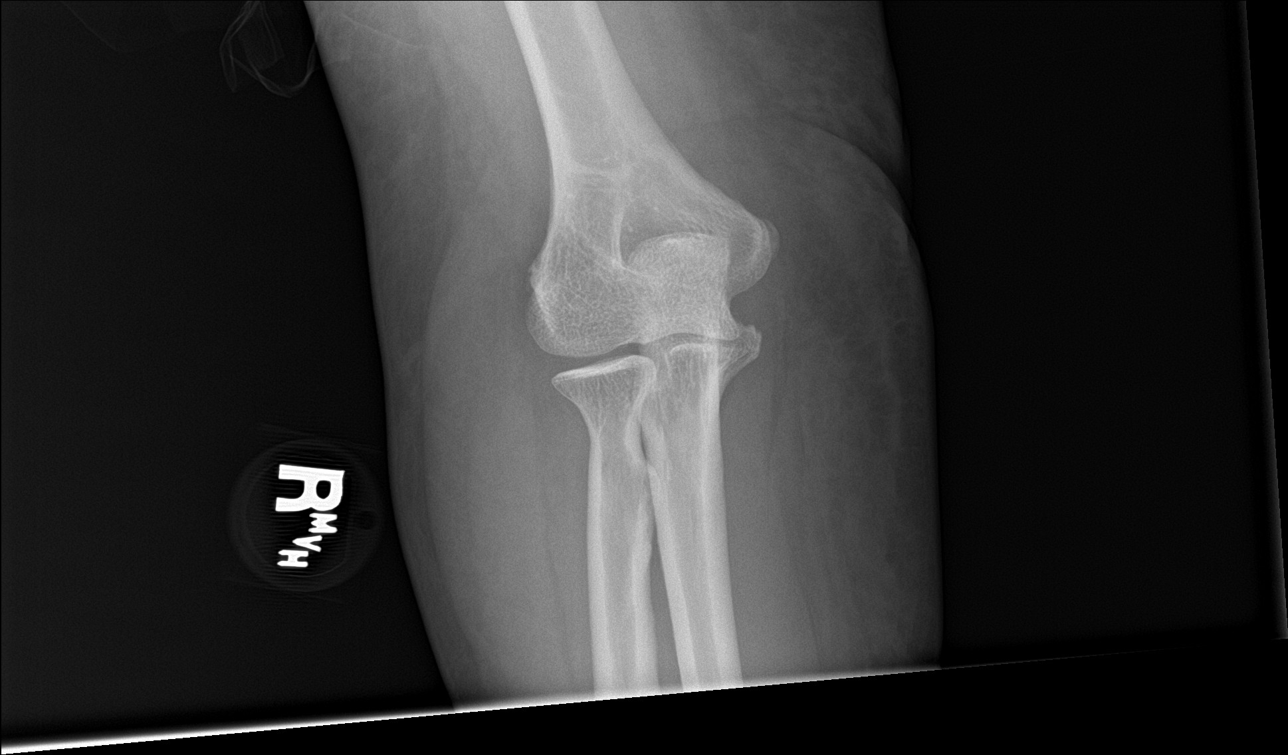

[elbow ap (2 of 5)]
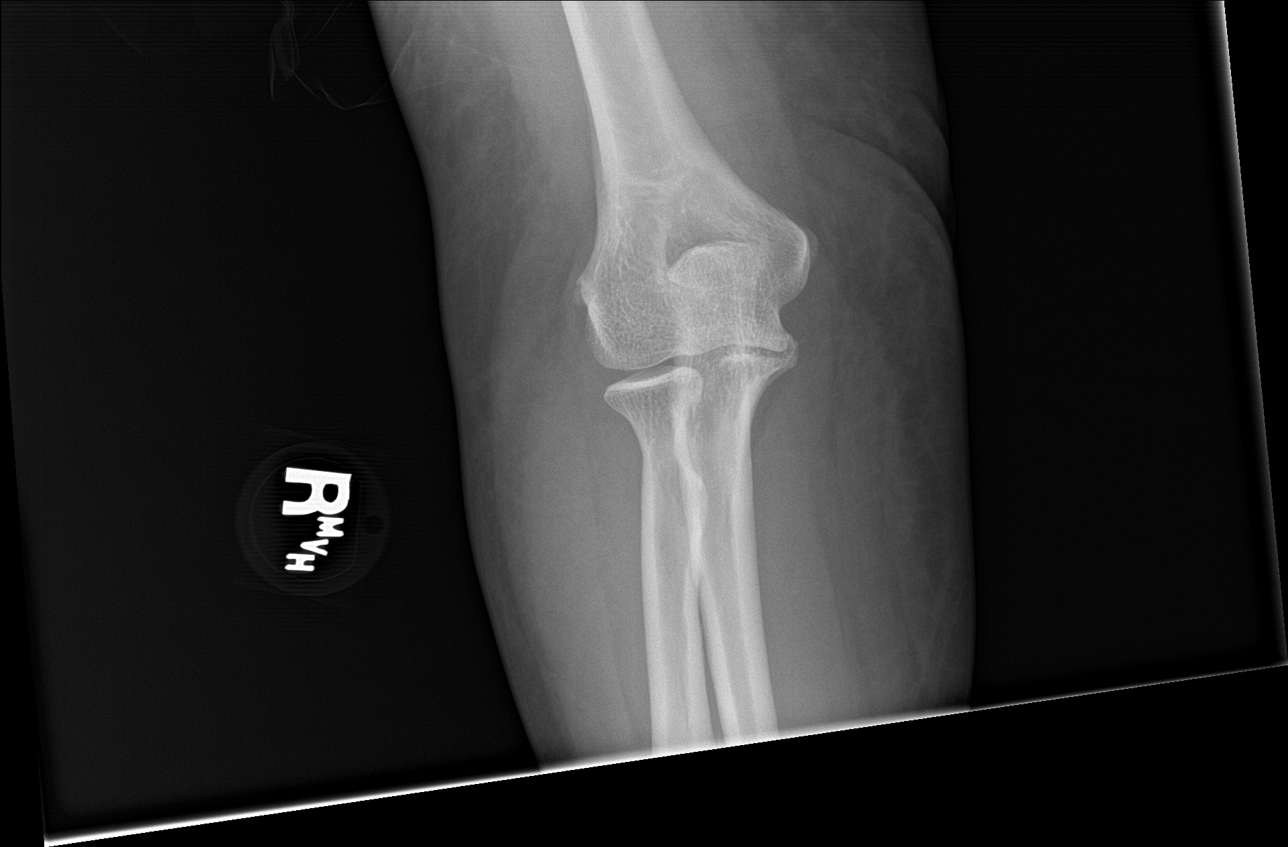

[elbow ap (3 of 5)]
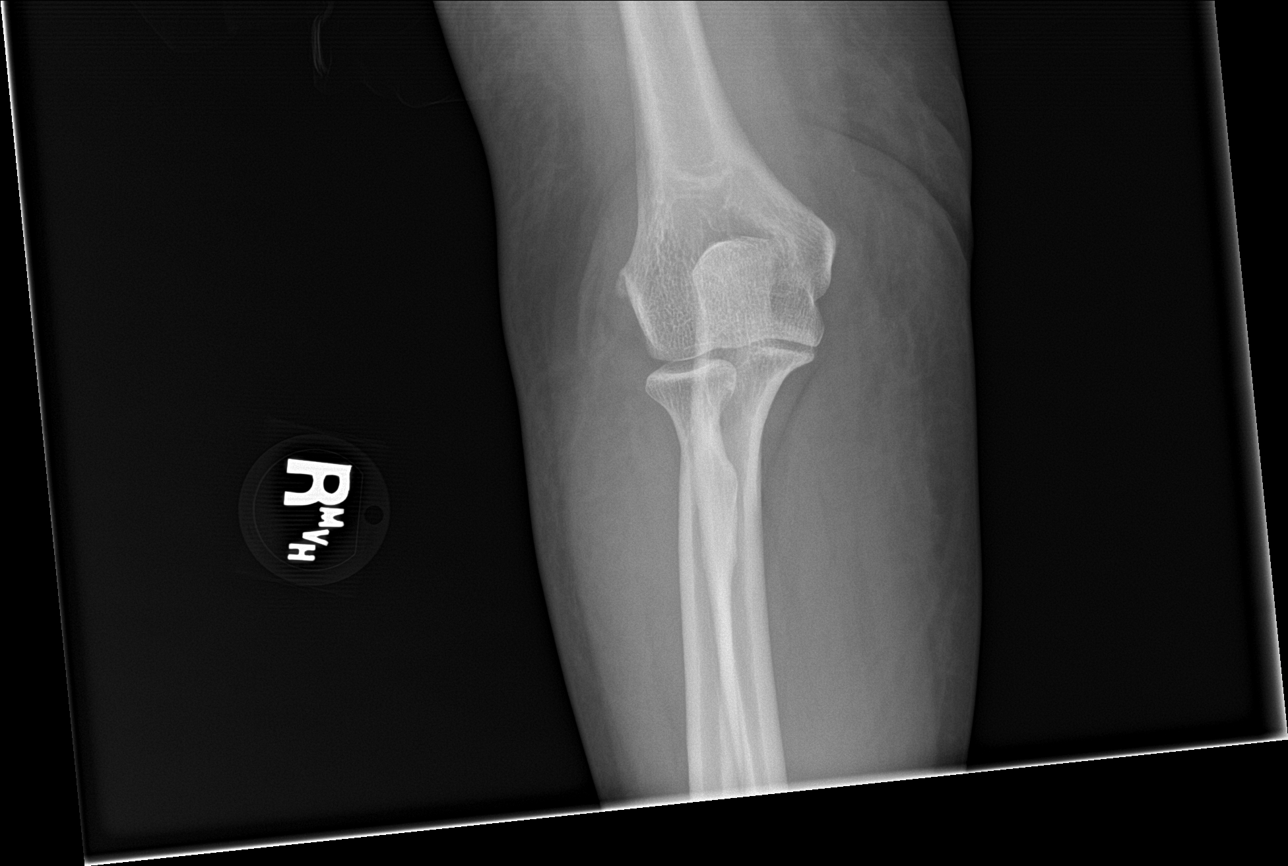

[elbow ap (4 of 5)]
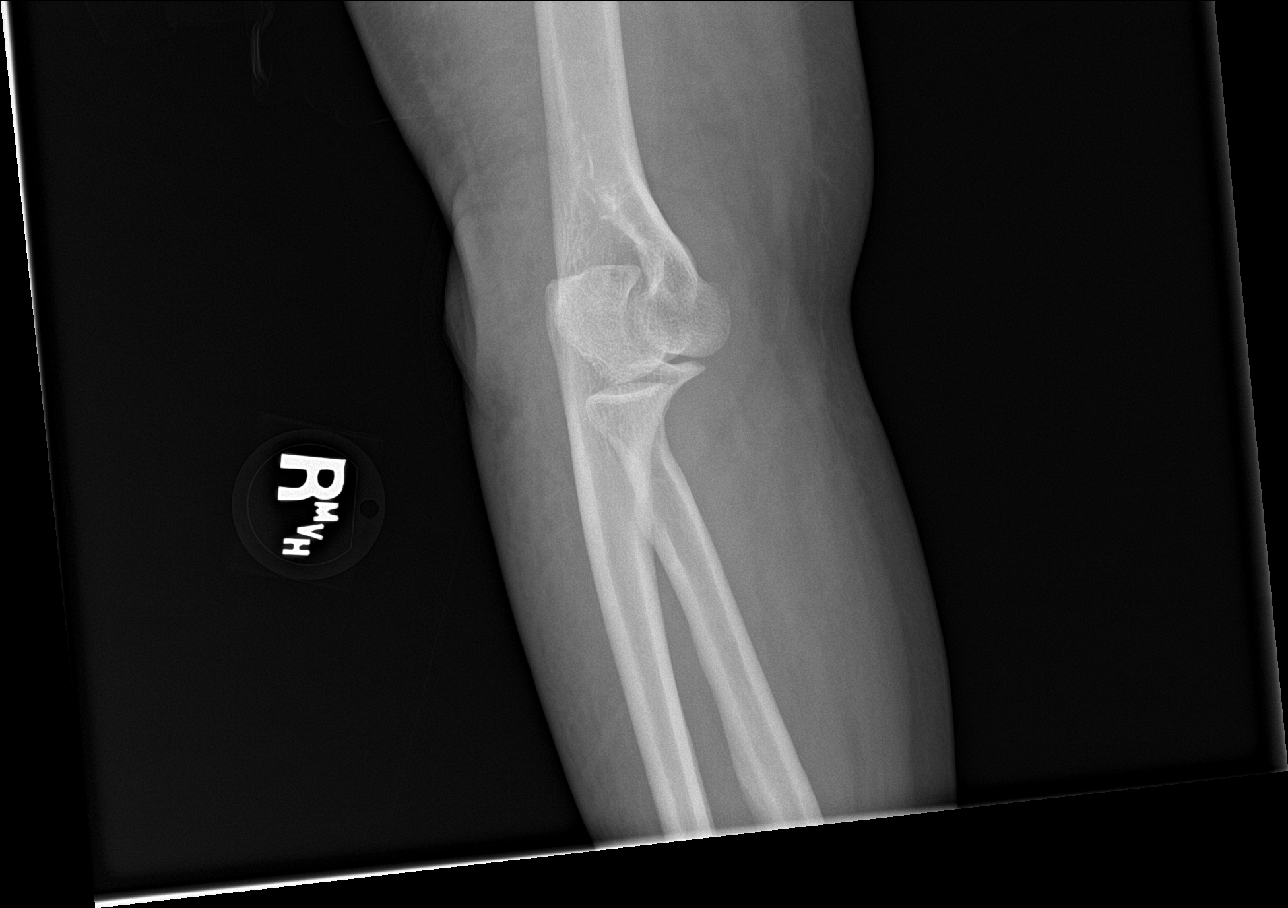

[elbow ap (5 of 5)]
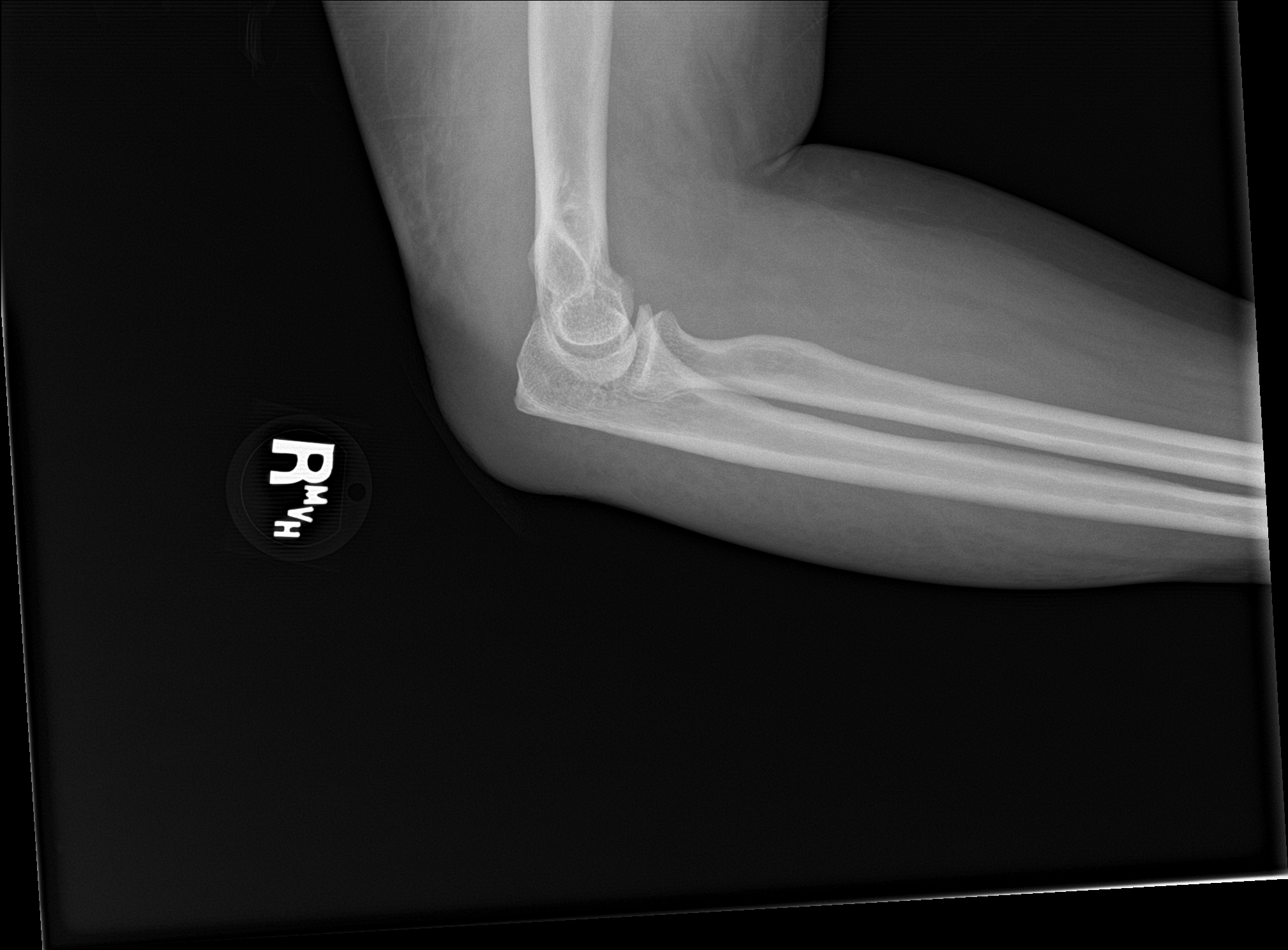

[5 of 5 positions shown; findings below may reference images not displayed]

FINDINGS: There is no evidence of fracture, dislocation, or joint effusion.
There is no evidence of arthropathy or other focal bone abnormality.
IMPRESSION: No acute fracture or dislocation identified.

## 2020-12-02 IMAGING — DX PORTABLE CHEST - 1 VIEW
1 series · 1 of 1 positions shown · non-contrast
Comparison: 03/06/2018

CLINICAL DATA: Cough, fever, shortness of breath.

EXAM:
PORTABLE CHEST 1 VIEW

[chest ap]
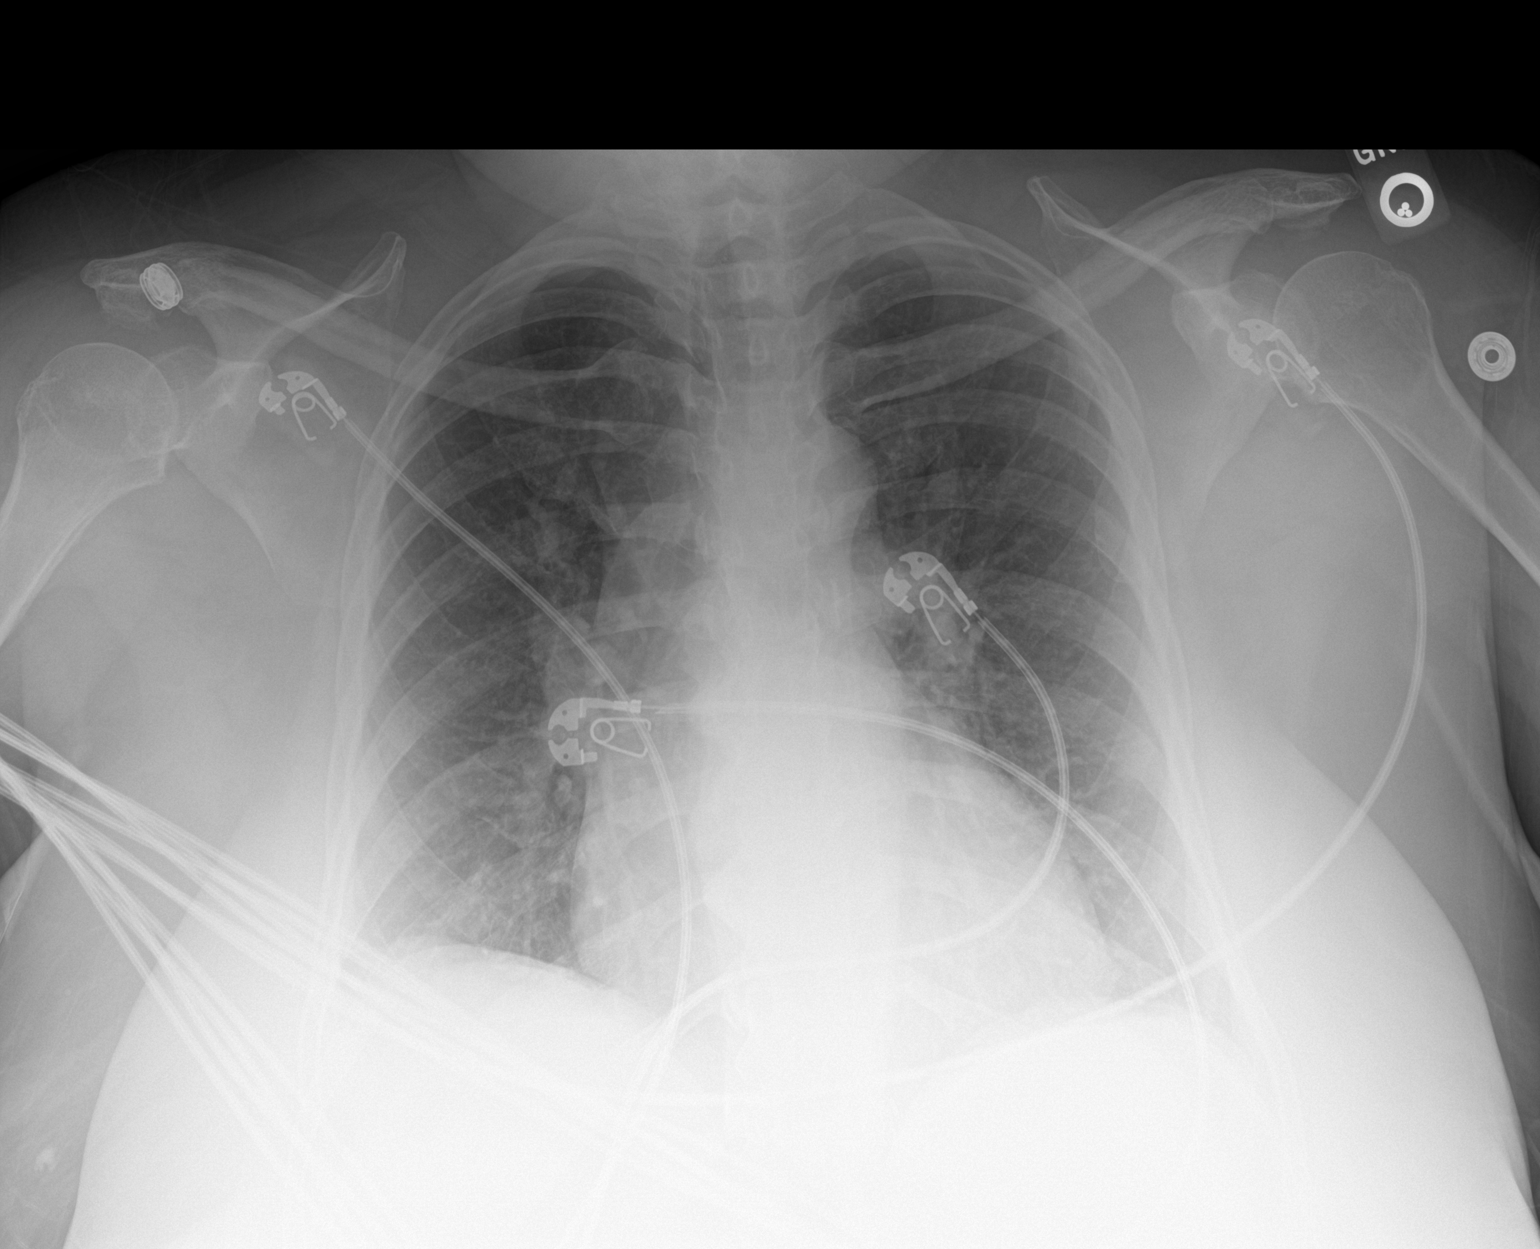

[1 of 1 positions shown; findings below may reference images not displayed]

FINDINGS: Mild enlargement of the cardiopericardial silhouette, with
indistinctness of the pulmonary vasculature but no definite
interstitial edema. Thoracic spondylosis is present. No appreciable
airspace opacity. No blunting of the costophrenic angles.
IMPRESSION: 1. Mild enlargement of the cardiopericardial silhouette potentially
with pulmonary venous hypertension, but no overt edema.

## 2020-12-05 ENCOUNTER — Encounter (HOSPITAL_COMMUNITY): Payer: Self-pay | Admitting: Urgent Care

## 2020-12-05 ENCOUNTER — Ambulatory Visit (HOSPITAL_COMMUNITY)
Admission: EM | Admit: 2020-12-05 | Discharge: 2020-12-05 | Disposition: A | Discharge status: Home / Self Care | Payer: 59 | Attending: Urgent Care | Admitting: Urgent Care

## 2020-12-05 ENCOUNTER — Other Ambulatory Visit: Payer: Self-pay

## 2020-12-05 DIAGNOSIS — J988 Other specified respiratory disorders: Secondary | ICD-10-CM

## 2020-12-05 DIAGNOSIS — E1022 Type 1 diabetes mellitus with diabetic chronic kidney disease: Secondary | ICD-10-CM | POA: Diagnosis present

## 2020-12-05 DIAGNOSIS — E1059 Type 1 diabetes mellitus with other circulatory complications: Secondary | ICD-10-CM | POA: Diagnosis not present

## 2020-12-05 DIAGNOSIS — I5030 Unspecified diastolic (congestive) heart failure: Secondary | ICD-10-CM

## 2020-12-05 DIAGNOSIS — Z20822 Contact with and (suspected) exposure to covid-19: Secondary | ICD-10-CM | POA: Insufficient documentation

## 2020-12-05 DIAGNOSIS — Z794 Long term (current) use of insulin: Secondary | ICD-10-CM

## 2020-12-05 DIAGNOSIS — L500 Allergic urticaria: Secondary | ICD-10-CM

## 2020-12-05 DIAGNOSIS — B9789 Other viral agents as the cause of diseases classified elsewhere: Secondary | ICD-10-CM

## 2020-12-05 IMAGING — CT CT OF THE RIGHT ELBOW WITHOUT CONTRAST
1 series · 12 of 14 positions shown, 15 images · non-contrast
Comparison: Radiograph 10/30/2018

CLINICAL DATA: Right arm pain and swelling.

EXAM:
CT OF THE LOWER UPPER EXTREMITY WITHOUT CONTRAST
TECHNIQUE: Multidetector CT imaging of the right upper extremity was performed
according to the standard protocol.

[Series 8: ax st · axial · 0.25mm/px · z∈[-92,+39]mm · 12 of 113 slices shown, 15 images]
[im 9/113  soft-tissue]
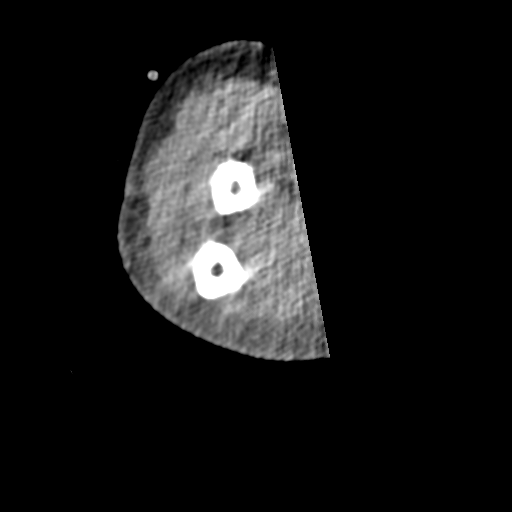
[im 9/113  bone]
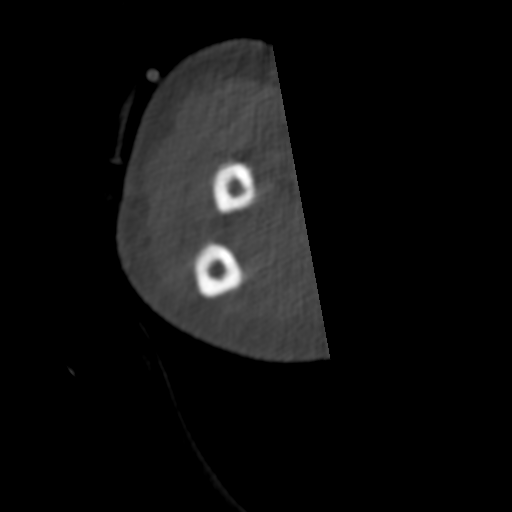
[im 18/113  bone]
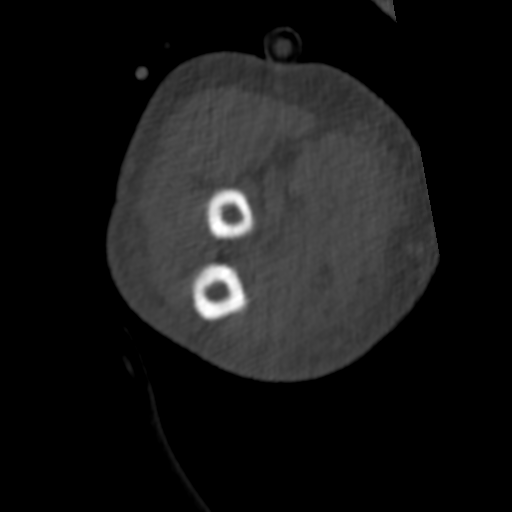
[im 26/113  bone]
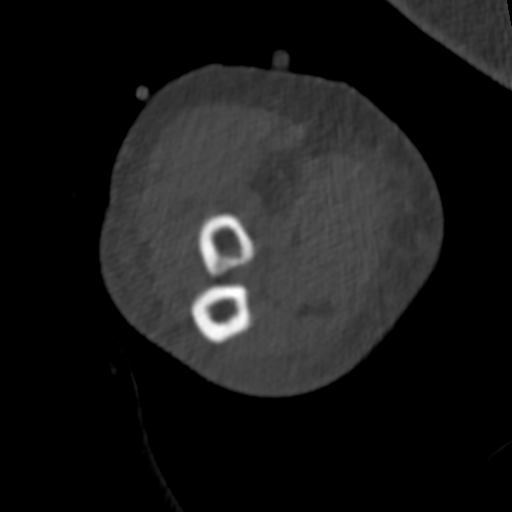
[im 35/113  bone]
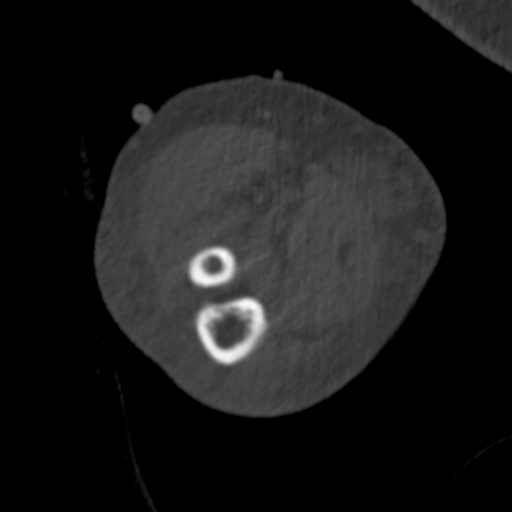
[im 44/113  soft-tissue]
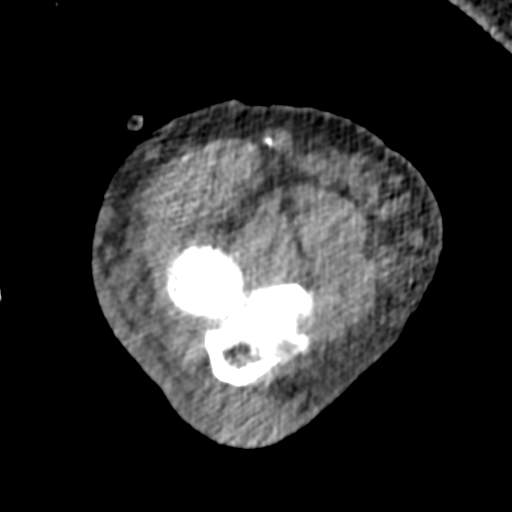
[im 44/113  bone]
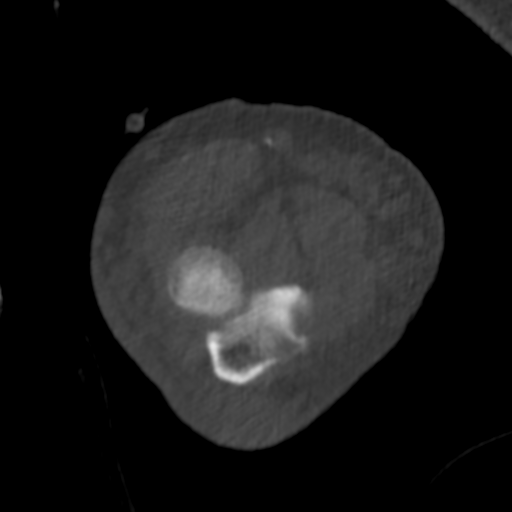
[im 52/113  bone]
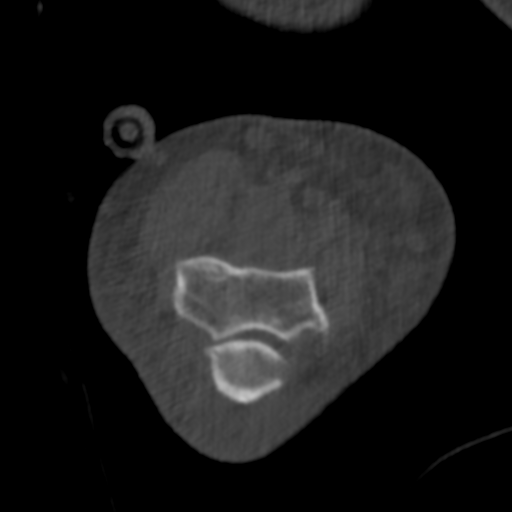
[im 61/113  bone]
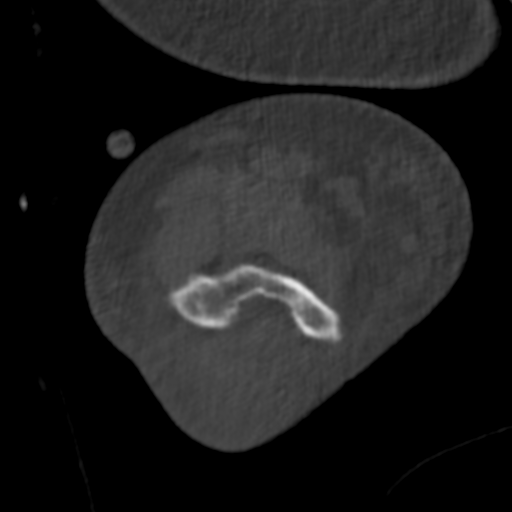
[im 69/113  bone]
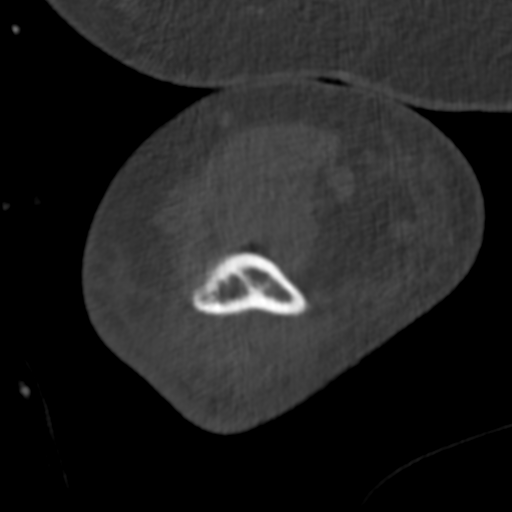
[im 78/113  soft-tissue]
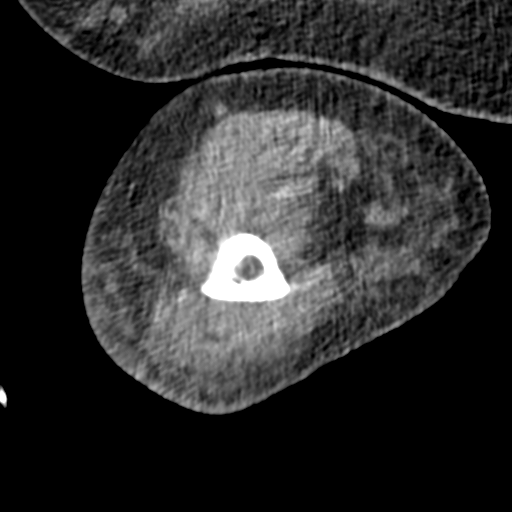
[im 78/113  bone]
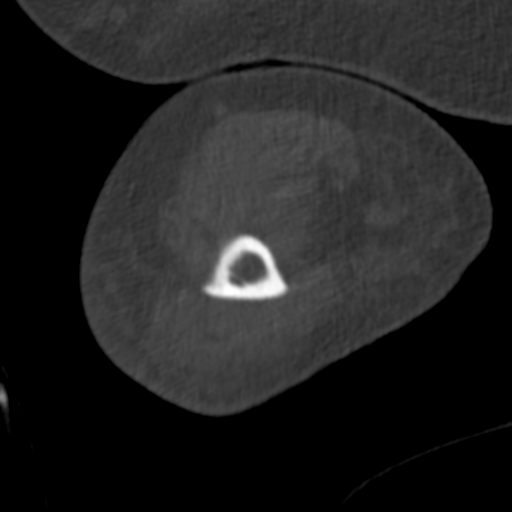
[im 87/113  bone]
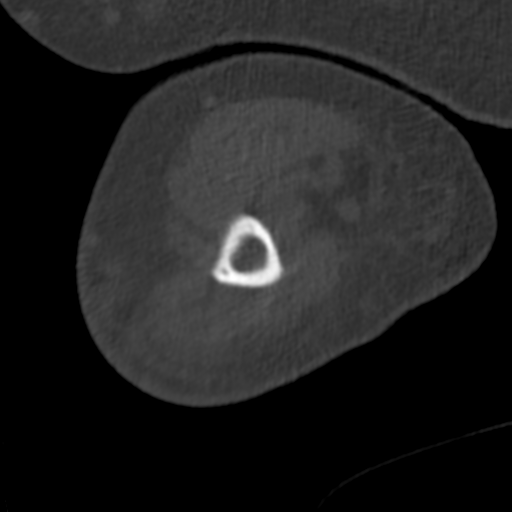
[im 95/113  bone]
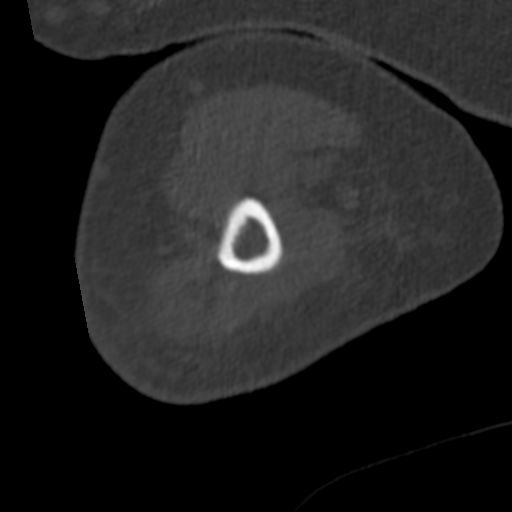
[im 104/113  bone]
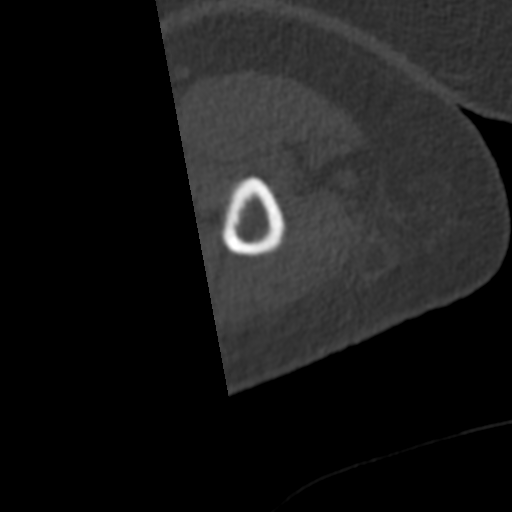

[12 of 14 positions shown; findings below may reference images not displayed]

FINDINGS: The joint spaces are maintained. No fracture or osteochondral lesion
is identified. Minimal spurring type changes near the common
extensor tendon attachment at the lateral epicondyle. No significant
degenerative changes. No joint effusion.

There is fairly marked soft tissue thickening along the olecranon
with some surrounding inflammatory changes. This area demonstrates
increased attenuation and could be complicated olecranon bursitis
(infectious or hemorrhagic bursitis) with surrounding cellulitis.
IMPRESSION: 1. Fairly marked soft tissue thickening over the olecranon region
could suggest olecranon bursitis. Could not exclude infection or
hemorrhage.
2. No significant bony findings and no elbow joint effusion.

## 2020-12-05 MED ORDER — DIPHENHYDRAMINE HCL 50 MG/ML IJ SOLN
INTRAMUSCULAR | Status: AC
  Filled 2020-12-05: qty 1

## 2020-12-05 MED ORDER — HYDROXYZINE HCL 25 MG PO TABS: 12.5000 mg | ORAL_TABLET | Freq: Three times a day (TID) | ORAL | 0 refills | Status: DC | PRN

## 2020-12-05 MED ORDER — DIPHENHYDRAMINE HCL 50 MG/ML IJ SOLN
50.0000 mg | Freq: Once | INTRAMUSCULAR | Status: AC
  Administered 2020-12-05: 50 mg via INTRAMUSCULAR

## 2020-12-05 NOTE — Discharge Instructions (Addendum)
We will notify you of your COVID-19 test results as they arrive and may take between 24 to 48 hours.  I encourage you to sign up for MyChart if you have not already done so as this can be the easiest way for us to communicate results to you online or through a phone app.  In the meantime, if you develop worsening symptoms including fever, chest pain, shortness of breath despite our current treatment plan then please report to the emergency room as this may be a sign of worsening status from possible COVID-19 infection.  Otherwise, we will manage this as a viral syndrome. For sore throat or cough try using a honey-based tea. Use 3 teaspoons of honey with juice squeezed from half lemon. Place shaved pieces of ginger into 1/2-1 cup of water and warm over stove top. Then mix the ingredients and repeat every 4 hours as needed. Please take Tylenol 500mg-650mg every 6 hours for aches and pains, fevers. Hydrate very well with at least 2 liters of water. Eat light meals such as soups to replenish electrolytes and soft fruits, veggies. Start an antihistamine like Zyrtec, Allegra or Claritin for postnasal drainage, sinus congestion.  

## 2020-12-05 NOTE — ED Provider Notes (Signed)
[place]   MRN: [id number] DOB: 1960-10-31  Subjective:   Amy [name] is a 60 y.o. female presenting for acute onset this morning of itchy rash over her torso and face, affecting her eye.  Has not taken any medications for relief.  Patient is a type I diabetic, has history of diastolic heart failure.  Denies shortness of breath, wheezing, chest pain, nausea, vomiting, abdominal pain.  No current facility-administered medications for this encounter.  Current Outpatient Medications:    albuterol (PROVENTIL HFA;VENTOLIN HFA) 108 (90 BASE) MCG/ACT inhaler, Inhale 2 puffs into the lungs every 4 (four) hours as needed for wheezing or shortness of breath. For short, Disp: , Rfl:    aspirin EC 81 MG tablet, Take 81 mg by mouth daily., Disp: , Rfl:    blood glucose meter kit and supplies, Dispense based on patient and insurance preference. Use up to four times daily as directed. (FOR ICD-10 E10.9, E11.9)., Disp: 1 each, Rfl: 0   cetirizine (ZYRTEC) 10 MG tablet, Take 10 mg by mouth daily., Disp: , Rfl:    Continuous Blood Gluc Receiver (FREESTYLE LIBRE 2 READER) DEVI, As instructed. iddm with complications - retinopathy, neuropathy, nephropathy. With device and sensors., Disp: 1 each, Rfl: 0   cyclobenzaprine (FLEXERIL) 5 MG tablet, 1 pill by mouth up to every 8 hours as needed. Start with one pill by mouth each bedtime as needed due to sedation, Disp: 15 tablet, Rfl: 0   Dulaglutide (TRULICITY) 1.5 SM/2.7MB SOPN, Inject 1.5 mg into the skin once a week., Disp: 6 mL, Rfl: 2   EMBRACE TALK GLUCOSE TEST test strip, USE TO CHECK BLOOD SUGARS FOUR TIMES A DAY BEFORE MEALS AND AT BEDTIME DX E11.29, Disp: 300 strip, Rfl: 3   esomeprazole (NEXIUM) 40 MG capsule, Take 1 capsule (40 mg total) by mouth daily., Disp: 30 capsule, Rfl: 0   gabapentin (NEURONTIN) 300 MG capsule, Take 1 capsule (300 mg total) by mouth 3 (three) times daily., Disp: 90 capsule, Rfl: 3   HUMALOG KWIKPEN 100  UNIT/ML KwikPen, Max Daily 30 units, Disp: 30 mL, Rfl: 3   Insulin Pen Needle (PEN NEEDLES) 31G X 8 MM MISC, Inject 1 Device into the skin in the morning, at noon, in the evening, and at bedtime., Disp: 400 each, Rfl: 3   levothyroxine (SYNTHROID) 150 MCG tablet, Take 1 tablet (150 mcg total) by mouth daily. Needs appt for future refills, Disp: 90 tablet, Rfl: 0   linaclotide (LINZESS) 145 MCG CAPS capsule, Take 145 mcg by mouth daily as needed (constipation). , Disp: , Rfl:    naproxen (NAPROSYN) 500 MG tablet, Take 500 mg by mouth 2 (two) times daily with a meal., Disp: , Rfl:    ondansetron (ZOFRAN ODT) 4 MG disintegrating tablet, Take 1 tablet (4 mg total) by mouth every 8 (eight) hours as needed for nausea or vomiting., Disp: 10 tablet, Rfl: 0   potassium chloride SA (KLOR-CON) 20 MEQ tablet, Take 1 tablet (20 mEq total) by mouth daily. Need appt for future refills, Disp: 90 tablet, Rfl: 0   Prodigy Twist Top Lancets 28G MISC, USE TO CHECK BLOOD SUGARS, Disp: , Rfl:    simvastatin (ZOCOR) 20 MG tablet, Take 1 tablet (20 mg total) by mouth every evening., Disp: 90 tablet, Rfl: 0   spironolactone (ALDACTONE) 25 MG tablet, Take 0.5 tablets (12.5 mg total) by mouth daily. Needs appt for future refills, Disp: 15 tablet, Rfl: 0   tiZANidine (ZANAFLEX) 2  MG tablet, Take 2 mg by mouth 2 (two) times daily., Disp: , Rfl:    torsemide (DEMADEX) 20 MG tablet, Take 4 tablets (80 mg total) by mouth daily. Need appt for future refills, Disp: 120 tablet, Rfl: 0   TRESIBA FLEXTOUCH 200 UNIT/ML FlexTouch Pen, Inject 22 Units into the skin daily., Disp: 15 mL, Rfl: 4   Allergies  Allergen Reactions   Penicillins Hives and Swelling    Has patient had a PCN reaction causing immediate rash, facial/tongue/throat swelling, SOB or lightheadedness with hypotension: yes- face swelling Has patient had a PCN reaction causing severe rash involving mucus membranes or skin necrosis: no Has patient had a PCN reaction that  required hospitalization unknown (childhood allergy) Has patient had a PCN reaction occurring within the last 10 years: no If all of the above answers are "NO", then may proceed with Cephalosporin use.    Tramadol Nausea And Vomiting    Intense nausea   Vicodin [Hydrocodone-Acetaminophen] Nausea And Vomiting   Codeine Nausea Only   Iodine-131 Hives   Iohexol Hives    Pt developed itching and hives along with nasal congestion; needs 13 hour premeds for future studies, Onset Date: [id number]    Lisinopril Cough   Sulfa Antibiotics Nausea And Vomiting    Past Medical History:  Diagnosis Date   Arthritis    Asthma    Blind    Blindness and low vision    left eye glass eye,  legally blind in right eye   Diabetes mellitus    Diabetic neuropathy ([place])    Hyperlipidemia    Hypertension    Hypothyroidism    Spinal stenosis      Past Surgical History:  Procedure Laterality Date   ABDOMINAL HYSTERECTOMY  [place], 1990   x 2   CHOLECYSTECTOMY  2008   ENUCLEATION Bilateral 09/15/1998   EYE SURGERY  2016   fitting artificial eye    Family History  Problem Relation Age of Onset   Diabetes Sister    Glaucoma Sister    Hypertension Sister    Healthy Mother    Healthy Father    Stroke Brother    Heart attack Paternal Aunt    Breast cancer Neg Hx     Social History   Tobacco Use   Smoking status: Former    Types: Cigarettes    Quit date: 05/22/1992    Years since quitting: 28.5   Smokeless tobacco: Never  Vaping Use   Vaping Use: Never used  Substance Use Topics   Alcohol use: No    Comment: quit 20 yrs ago   Drug use: No    ROS   Objective:   Vitals: BP (!) 139/58 (BP Location: Left Arm)   Pulse 87   Temp 99.9 F (37.7 C) (Oral)   Resp 16   SpO2 98%   Physical Exam Constitutional:      General: She is not in acute distress.    Appearance: Normal appearance. She is well-developed. She is not ill-appearing, toxic-appearing or  diaphoretic.  HENT:     Head: Normocephalic and atraumatic.     Right Ear: Tympanic membrane and ear canal normal. No drainage or tenderness. No middle ear effusion. Tympanic membrane is not erythematous.     Left Ear: Tympanic membrane and ear canal normal. No drainage or tenderness.  No middle ear effusion. Tympanic membrane is not erythematous.     Nose: Nose normal. No congestion or rhinorrhea.  Mouth/Throat:     Mouth: Mucous membranes are moist. No oral lesions.     Pharynx: Oropharynx is clear. No pharyngeal swelling, oropharyngeal exudate, posterior oropharyngeal erythema or uvula swelling.     Tonsils: No tonsillar exudate or tonsillar abscesses.     Comments: Airway is patent, patient is able to speak in full sentences, control secretions.  No oral or facial swelling. Eyes:     General: No scleral icterus.       Right eye: No discharge.        Left eye: No discharge.     Extraocular Movements: Extraocular movements intact.     Right eye: Normal extraocular motion.     Left eye: Normal extraocular motion.     Pupils: Pupils are equal, round, and reactive to light.     Comments: Conjunctiva slightly injected.  Missing left eye.  Cardiovascular:     Rate and Rhythm: Normal rate and regular rhythm.     Pulses: Normal pulses.     Heart sounds: Normal heart sounds. No murmur heard.   No friction rub. No gallop.  Pulmonary:     Effort: Pulmonary effort is normal. No respiratory distress.     Breath sounds: Normal breath sounds. No stridor. No wheezing, rhonchi or rales.  Musculoskeletal:     Cervical back: Normal range of motion and neck supple.  Lymphadenopathy:     Cervical: No cervical adenopathy.  Skin:    General: Skin is warm and dry.     Findings: Rash (urticarial lesions throughout torso, neck, face and upper extremities) present.  Neurological:     General: No focal deficit present.     Mental Status: She is alert and oriented to person, place, and time.   Psychiatric:        Mood and Affect: Mood normal.        Behavior: Behavior normal.        Thought Content: Thought content normal.        Judgment: Judgment normal.    Assessment and Plan :   PDMP not reviewed this encounter.  1. Viral respiratory illness   2. Allergic urticaria   3. Type 1 diabetes mellitus with diabetic chronic kidney disease, unspecified CKD stage ([place])     Due to her diabetes and risk of using steroids with heart failure we will use IM Benadryl.  Suspect allergic urticaria and emphasized patient have someone help her identify any new exposures at home.  She does live on her own and therefore will require assistance with this.  Creatinine clearance calculated at 39 ml/min.  Therefore will use 12.5 mg hydroxyzine which is half the dose I normally recommend as the instructions indicate on up-to-date.  I do not suspect anaphylaxis and therefore will not redirect to the emergency room. Counseled patient on potential for adverse effects with medications prescribed/recommended today, ER and return-to-clinic precautions discussed, patient verbalized understanding.  At discharge, patient requested a COVID-19 test as her PCP recommended it.  Reports that she has had 3-day history of runny and stuffy nose, slight malaise.  COVID-19 testing pending. Recommended supportive care.    Jaynee Eagles, [place] 12/05/20 2334

## 2020-12-05 NOTE — ED Triage Notes (Signed)
Pt present a rash that has spread on  her face, arms and legs with itching, pt is also complaining of right eye redness and watery. Pt states her left eye is watery also and was unable to put her glass eye in

## 2020-12-06 ENCOUNTER — Telehealth (HOSPITAL_COMMUNITY): Payer: Self-pay

## 2020-12-06 LAB — SARS CORONAVIRUS 2 (TAT 6-24 HRS): SARS Coronavirus 2: NEGATIVE

## 2020-12-06 NOTE — Telephone Encounter (Signed)
Yesterday, during patient visit she asked me would I call her to inform her lab results, so she would not have to wait.  This am I called patient to let her know  that her lab results are negative.

## 2020-12-09 NOTE — Progress Notes (Signed)
Patient is here with her care giver to discuss carb counting.  We reviewed the need for carb counting because her meals vary considerably in carbs from day to day and meal to meal-ranging from 15 to 75.  Discussed how the insulin needs to be adjusted to cover the meals that have more carbs Discussed what carbs were and the quantities of each amount she is eating.  Written instructions given to care giver for 15 gram portions sizes.  For simplicity sake, she will given 1u for every 15 grams, or portions noted on list.  Caregiver reported good understanding of this.  Discussed what to do when she thinks she is eating more and does not want to finish.  She has soda at home and will drink 4 ounces of this, to compensate for reduced meal size after insulin was given Both patient and care giver had no final quesitons.

## 2020-12-09 NOTE — Patient Instructions (Signed)
Review list of carbohydrate foods and what 15 gram portions are for carbs you are eating. Give 1u for every 15 grams of carb eaten at each meal. REad over brochures on carb counting given as well has handout of 15 gram carb portions sizes. Call if quesitons

## 2020-12-15 ENCOUNTER — Telehealth: Payer: Self-pay | Admitting: Nutrition

## 2020-12-15 ENCOUNTER — Telehealth: Payer: Self-pay | Admitting: Family Medicine

## 2020-12-15 NOTE — Telephone Encounter (Signed)
Called the patient back. In reviewing her data the machine has been working well and good up until the last week or so. Looks like her mask has been having leaks which can cause too much pressure to blow out. Patient states that her gout has been flaring up which is causing problems with allowing her to get a good seal. Informed the patient if she can try and get a better seal then that may fix the problem. Pt verbalized understanding. Pt had no questions at this time but was encouraged to call back if questions arise.

## 2020-12-15 NOTE — Telephone Encounter (Signed)
Called patient to see how she is doing with her carb counting, and her blood sugars using 1u/15 grams.  She says blood sugars were high all day yesterday, and the app for her reader is not working.  I scheduled her for tomorrow to review readings and to review carb counting.

## 2020-12-15 NOTE — Telephone Encounter (Signed)
Pt called states her CPAP is letting out too much air. Pt is requesting a call back.

## 2020-12-16 ENCOUNTER — Encounter: Payer: 59 | Attending: Internal Medicine | Admitting: Nutrition

## 2020-12-16 ENCOUNTER — Other Ambulatory Visit: Payer: Self-pay

## 2020-12-16 DIAGNOSIS — E1039 Type 1 diabetes mellitus with other diabetic ophthalmic complication: Secondary | ICD-10-CM | POA: Diagnosis not present

## 2020-12-21 NOTE — Patient Instructions (Addendum)
Call 800 help line if problems with app.

## 2020-12-21 NOTE — Progress Notes (Signed)
Patient is here with her aide, because her [employer] on her phone  not working.  New app was installed and data was linked to [place].  New sensor was inserted and transmitter was linked to phone.

## 2020-12-23 ENCOUNTER — Ambulatory Visit: Payer: 59 | Admitting: Internal Medicine

## 2020-12-28 ENCOUNTER — Telehealth: Payer: Self-pay | Admitting: Nutrition

## 2020-12-28 NOTE — Telephone Encounter (Signed)
Message left on my machine that she is having difficulty with her Elenor [employer]ato app and it not reading the sensor.  She was called and told to call the Elenor Legato help line [phone]

## 2020-12-30 ENCOUNTER — Other Ambulatory Visit: Payer: Self-pay

## 2020-12-30 ENCOUNTER — Ambulatory Visit (INDEPENDENT_AMBULATORY_CARE_PROVIDER_SITE_OTHER): Payer: 59 | Admitting: Internal Medicine

## 2020-12-30 VITALS — BP 120/76 | HR 81 | Ht 59.0 in | Wt 196.4 lb

## 2020-12-30 DIAGNOSIS — E1142 Type 2 diabetes mellitus with diabetic polyneuropathy: Secondary | ICD-10-CM | POA: Diagnosis not present

## 2020-12-30 DIAGNOSIS — E11319 Type 2 diabetes mellitus with unspecified diabetic retinopathy without macular edema: Secondary | ICD-10-CM

## 2020-12-30 DIAGNOSIS — Z794 Long term (current) use of insulin: Secondary | ICD-10-CM | POA: Diagnosis not present

## 2020-12-30 DIAGNOSIS — E1165 Type 2 diabetes mellitus with hyperglycemia: Secondary | ICD-10-CM

## 2020-12-30 LAB — POCT GLYCOSYLATED HEMOGLOBIN (HGB A1C): Hemoglobin A1C: 9.7 % — AB (ref 4.0–5.6)

## 2020-12-30 NOTE — Patient Instructions (Signed)
-   Increase Tresiba to 14 units daily - Continue Trulicity 1.5 mg weekly  - Change  Humalog to 6 units with each meal      HOW TO TREAT LOW BLOOD SUGARS (Blood sugar LESS THAN 70 MG/DL) Please follow the RULE OF 15 for the treatment of hypoglycemia treatment (when your (blood sugars are less than 70 mg/dL)   STEP 1: Take 15 grams of carbohydrates when your blood sugar is low, which includes:  3-4 GLUCOSE TABS  OR 3-4 OZ OF JUICE OR REGULAR SODA OR ONE TUBE OF GLUCOSE GEL    STEP 2: RECHECK blood sugar in 15 MINUTES STEP 3: If your blood sugar is still low at the 15 minute recheck --> then, go back to STEP 1 and treat AGAIN with another 15 grams of carbohydrates.     HOW TO TREAT LOW BLOOD SUGARS (Blood sugar LESS THAN 70 MG/DL) Please follow the RULE OF 15 for the treatment of hypoglycemia treatment (when your (blood sugars are less than 70 mg/dL)   STEP 1: Take 15 grams of carbohydrates when your blood sugar is low, which includes:  3-4 GLUCOSE TABS  OR 3-4 OZ OF JUICE OR REGULAR SODA OR ONE TUBE OF GLUCOSE GEL    STEP 2: RECHECK blood sugar in 15 MINUTES STEP 3: If your blood sugar is still low at the 15 minute recheck --> then, go back to STEP 1 and treat AGAIN with another 15 grams of carbohydrates.

## 2020-12-30 NOTE — Progress Notes (Signed)
Name: Amy [name]  Age/ Sex: 60 y.o., female   MRN/ DOB: [id number], 01-01-1961     PCP: Roselee Nova, MD   Reason for Endocrinology Evaluation: Type 2 Diabetes Mellitus  Initial Endocrine Consultative Visit: 06/29/2020    PATIENT IDENTIFIER: Amy [name] is a 60 y.o. female with a past medical history of T2DM, Hypothyroidism and Dyslipidemia. The patient has followed with Endocrinology clinic since 06/29/2020 for consultative assistance with management of her diabetes.  DIABETIC HISTORY:  Amy [name] was diagnosed with T2DM in her 61's. Her hemoglobin A1c has ranged from 9.9% in 2021, peaking at 12.3% in 2014.   SUBJECTIVE:   Today (12/30/2020): Amy [name] is here for a follow up on diabetes management. She is accompanied by her assistant. She checks her blood sugars multiple times a day through CGM.   The patient has  not had hypoglycemic episodes since the last clinic visit. Denies nausea or diarrhea or Vomiting   Was recently diagnosed with gout , no prednisone    HOME DIABETES REGIMEN:  Tresiba 22 units - taking 13 units  Humalog 3 with breakfast, 4 with lunch and 7 units with supper - has been taking 8/3/3 Trulicity 1.5 mg weekly ( Friday )  CF: Humalog (BG -130/45)- not using     Statin: yes ACE-I/ARB: no     CONTINUOUS GLUCOSE MONITORING RECORD INTERPRETATION    Dates of Recording: 8/4-8/17/2022  Sensor description: freestyle libre   Results statistics:   CGM use % of time 46  Average and SD 187/24.6  Time in range     38 %  % Time Above 180 55  % Time above 250 7  % Time Below target 0     Glycemic patterns summary: Bg's optimal during the night but hyperglycemia noted during the day   Hyperglycemic episodes  postprandial  Hypoglycemic episodes occurred N/A  Overnight periods: trends down         DIABETIC COMPLICATIONS: Microvascular complications:   Denies: CKD III, neuropathy.  Left prosthetic eye. Blind right eye  Last Eye  Exam: Completed 04/2020 ( Dr. Zadie Rhine)    Macrovascular complications:   Denies: CAD, CVA, PVD   HISTORY:  Past Medical History:  Past Medical History:  Diagnosis Date   Arthritis    Asthma    Blind    Blindness and low vision    left eye glass eye,  legally blind in right eye   Diabetes mellitus    Diabetic neuropathy ([place])    Hyperlipidemia    Hypertension    Hypothyroidism    Spinal stenosis    Past Surgical History:  Past Surgical History:  Procedure Laterality Date   [place]   x 2   CHOLECYSTECTOMY  2008   ENUCLEATION Bilateral 09/15/1998   EYE SURGERY  2016   fitting artificial eye   Social History:  reports that she quit smoking about 28 years ago. Her smoking use included cigarettes. She has never used smokeless tobacco. She reports that she does not drink alcohol and does not use drugs. Family History:  Family History  Problem Relation Age of Onset   Diabetes Sister    Glaucoma Sister    Hypertension Sister    Healthy Mother    Healthy Father    Stroke Brother    Heart attack Paternal Aunt    Breast cancer Neg Hx      HOME MEDICATIONS: Allergies  as of 12/30/2020       Reactions   Penicillins Hives, Swelling   Has patient had a PCN reaction causing immediate rash, facial/tongue/throat swelling, SOB or lightheadedness with hypotension: yes- face swelling Has patient had a PCN reaction causing severe rash involving mucus membranes or skin necrosis: no Has patient had a PCN reaction that required hospitalization unknown (childhood allergy) Has patient had a PCN reaction occurring within the last 10 years: no If all of the above answers are "NO", then may proceed with Cephalosporin use.   Tramadol Nausea And Vomiting   Intense nausea   Vicodin [hydrocodone-acetaminophen] Nausea And Vomiting   Codeine Nausea Only   Iodine-131 Hives   Iohexol Hives   Pt developed itching and hives along with nasal  congestion; needs 13 hour premeds for future studies, Onset Date: [id number]   Lisinopril Cough   Sulfa Antibiotics Nausea And Vomiting        Medication List        Accurate as of December 30, 2020  9:41 AM. If you have any questions, ask your nurse or doctor.          albuterol 108 (90 Base) MCG/ACT inhaler Commonly known as: VENTOLIN HFA Inhale 2 puffs into the lungs every 4 (four) hours as needed for wheezing or shortness of breath. For short   aspirin EC 81 MG tablet Take 81 mg by mouth daily.   blood glucose meter kit and supplies Dispense based on patient and insurance preference. Use up to four times daily as directed. (FOR ICD-10 E10.9, E11.9).   cetirizine 10 MG tablet Commonly known as: ZYRTEC Take 10 mg by mouth daily.   cyclobenzaprine 5 MG tablet Commonly known as: FLEXERIL 1 pill by mouth up to every 8 hours as needed. Start with one pill by mouth each bedtime as needed due to sedation   Embrace Talk Glucose Test test strip Generic drug: glucose blood USE TO CHECK BLOOD SUGARS FOUR TIMES A DAY BEFORE MEALS AND AT BEDTIME DX E11.29   esomeprazole 40 MG capsule Commonly known as: NEXIUM Take 1 capsule (40 mg total) by mouth daily.   FreeStyle [place] 2 Reader [place] As instructed. iddm with complications - retinopathy, neuropathy, nephropathy. With device and sensors.   gabapentin 300 MG capsule Commonly known as: NEURONTIN TAKE 1 CAPSULE (300 MG TOTAL) BY MOUTH 3 (THREE) TIMES DAILY.   HumaLOG KwikPen 100 UNIT/ML KwikPen Generic drug: insulin lispro Max Daily 30 units   hydrOXYzine 25 MG tablet Commonly known as: ATARAX/VISTARIL Take 0.5 tablets (12.5 mg total) by mouth every 8 (eight) hours as needed for itching.   levothyroxine 150 MCG tablet Commonly known as: SYNTHROID Take 1 tablet (150 mcg total) by mouth daily. Needs appt for future refills   linaclotide 145 MCG Caps capsule Commonly known as: LINZESS Take 145 mcg by mouth daily as needed  (constipation).   naproxen 500 MG tablet Commonly known as: NAPROSYN Take 500 mg by mouth 2 (two) times daily with a meal.   ondansetron 4 MG disintegrating tablet Commonly known as: Zofran ODT Take 1 tablet (4 mg total) by mouth every 8 (eight) hours as needed for nausea or vomiting.   Pen Needles 31G X 8 MM Misc Inject 1 Device into the skin in the morning, at noon, in the evening, and at bedtime.   potassium chloride SA 20 MEQ tablet Commonly known as: KLOR-CON Take 1 tablet (20 mEq total) by mouth daily. Need appt for future refills  Prodigy Twist Top Lancets 28G Misc USE TO CHECK BLOOD SUGARS   simvastatin 20 MG tablet Commonly known as: ZOCOR Take 1 tablet (20 mg total) by mouth every evening.   spironolactone 25 MG tablet Commonly known as: ALDACTONE Take 0.5 tablets (12.5 mg total) by mouth daily. Needs appt for future refills   tiZANidine 2 MG tablet Commonly known as: ZANAFLEX Take 2 mg by mouth 2 (two) times daily.   torsemide 20 MG tablet Commonly known as: DEMADEX Take 4 tablets (80 mg total) by mouth daily. Need appt for future refills   Tresiba FlexTouch 200 UNIT/ML FlexTouch Pen Generic drug: insulin degludec Inject 22 Units into the skin daily.   Trulicity 1.5 XT/0.2IO Sopn Generic drug: Dulaglutide Inject 1.5 mg into the skin once a week.         OBJECTIVE:   Vital Signs: BP 120/76   Pulse 81   Ht 4' 11" (1.499 m)   Wt 196 lb 6.4 oz (89.1 kg)   SpO2 99%   BMI 39.67 kg/m   Wt Readings from Last 3 Encounters:  12/30/20 196 lb 6.4 oz (89.1 kg)  09/18/20 212 lb 6 oz (96.3 kg)  08/05/20 214 lb (97.1 kg)     Exam: General: Pt appears well and is in NAD  Lungs: Clear with good BS bilat with no rales, rhonchi, or wheezes  Heart: RRR with normal S1 and S2 and no gallops; no murmurs; no rub  Extremities: Race  pretibial edema.   Neuro: MS is good with appropriate affect, pt is alert and Ox3              DATA REVIEWED:  Lab  Results  Component Value Date   HGBA1C 9.7 (A) 12/30/2020   HGBA1C 7.6 (A) 09/18/2020   HGBA1C 7.6 (A) 06/29/2020   Lab Results  Component Value Date   MICROALBUR 9.74 (H) 10/30/2012   LDLCALC 66 04/13/2020   CREATININE 1.49 (H) 06/08/2020   Results for MICHAELLA, IMAI (MRN [id number]) as of 12/30/2020 09:40  Ref. Range 06/08/2020 15:43  Sodium Latest Ref Range: 134 - 144 mmol/L 145 (H)  Potassium Latest Ref Range: 3.5 - 5.2 mmol/L 4.6  Chloride Latest Ref Range: 96 - 106 mmol/L 107 (H)  CO2 Latest Ref Range: 20 - 29 mmol/L 26  Glucose Latest Ref Range: 65 - 99 mg/dL 63 (L)  BUN Latest Ref Range: 8 - 27 mg/dL 44 (H)  Creatinine Latest Ref Range: 0.57 - 1.00 mg/dL 1.49 (H)  Calcium Latest Ref Range: 8.7 - 10.3 mg/dL 9.9  BUN/Creatinine Ratio Latest Ref Range: 12 - 28  30 (H)  GFR, Est Non African American Latest Ref Range: >59 mL/min/1.73 38 (L)  GFR, Est African American Latest Ref Range: >59 mL/min/1.73 43 (L)    ASSESSMENT / PLAN / RECOMMENDATIONS:   1) Type 2 Diabetes Mellitus, Poorly controlled, With neuropathic, retinopathic and CKD III complications - Most recent A1c of 9.7 %. Goal A1c < 7.0 %.    -Her A1c has increased due to elimination of hypoglycemia which is a priority to eliminate hypoglycemia.   - She is using lower doses of basal insulin then previously prescribed, will adjust this slightly  - I am also going to simplify her insulin doses, as she indicated its hard to remember how many units of humalog  - I will not provider her with a correction scale, she doesn't use it  - Main barrier to diabetes self care if visual impairement  MEDICATIONS: -  Increase Tresiba to 14 units daily - Continue Trulicity 1.5 mg weekly  - Change  Humalog to 6 units with each meal   EDUCATION / INSTRUCTIONS: BG monitoring instructions: Patient is instructed to check her blood sugars 3 times a day, before meals. Call [facility] if: BG persistently < 70  I reviewed  the Rule of 15 for the treatment of hypoglycemia in detail with the patient. Literature supplied.    2) Diabetic complications:  Eye: Does  have known diabetic retinopathy.  Neuro/ Feet: Does  have known diabetic peripheral neuropathy .  Renal: Patient does  have known baseline CKD. She   is not on an ACEI/ARB at present.      F/U in 3 months   Signed electronically by: Mack Guise, MD  [facility]  [facility] [address] [place], [place] [address] Phone: [phone] FAX: [phone]   CC: Roselee Nova, [place] [address] Phone: [phone]  Fax: [phone]  Return to Endocrinology clinic as below: Future Appointments  Date Time Provider [place]  01/04/2021  1:00 PM Debbora Presto, NP [facility] None  02/01/2021 10:45 AM Marzetta Board, DPM [facility]

## 2020-12-31 ENCOUNTER — Encounter: Payer: Self-pay | Admitting: Internal Medicine

## 2021-01-04 ENCOUNTER — Ambulatory Visit: Payer: 59 | Admitting: Family Medicine

## 2021-01-09 IMAGING — MG DIGITAL DIAGNOSTIC UNILATERAL RIGHT MAMMOGRAM WITH TOMO AND CAD
6 series · 6 of 18 positions shown · non-contrast
Comparison: Previous exam(s).

CLINICAL DATA: 1 cm mass felt by the patient and on recent physical
examination in the 2 o'clock position of the right breast. The
patient cannot feel a mass today.

EXAM:
DIGITAL DIAGNOSTIC RIGHT MAMMOGRAM WITH CAD AND TOMO
ULTRASOUND RIGHT BREAST

[R CC synth-2D]
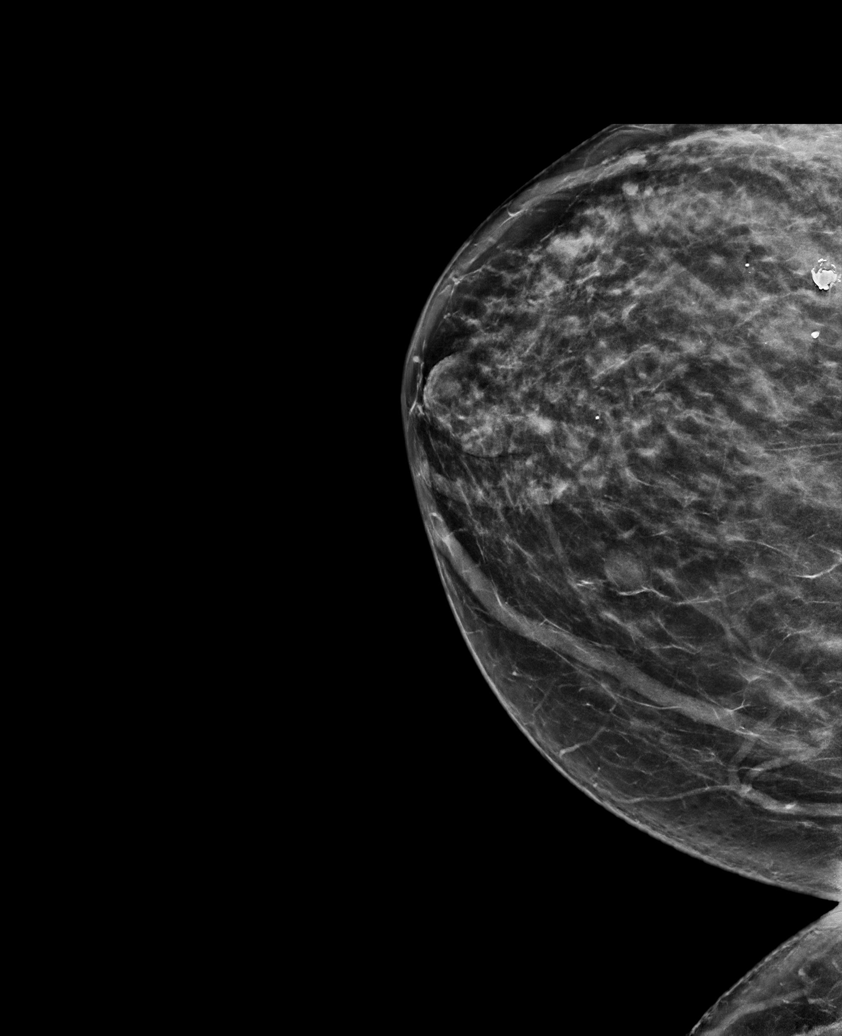

[R MLO synth-2D (1 of 2)]
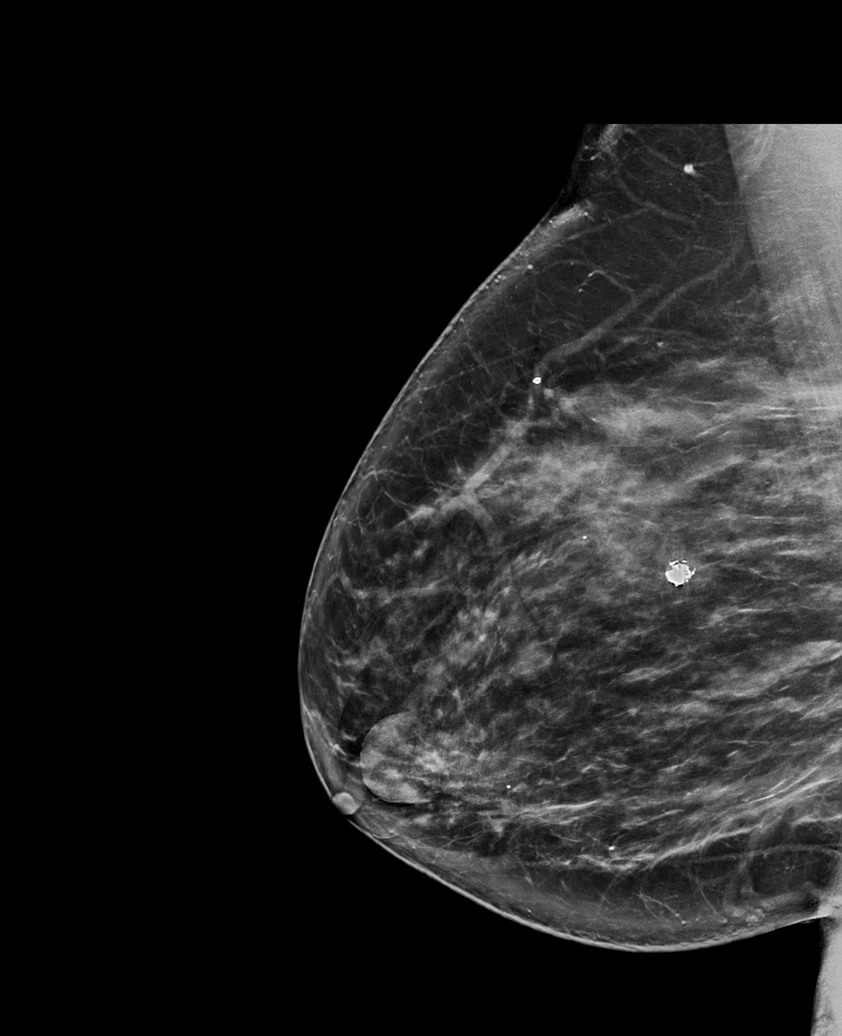

[R MLO synth-2D (2 of 2)]
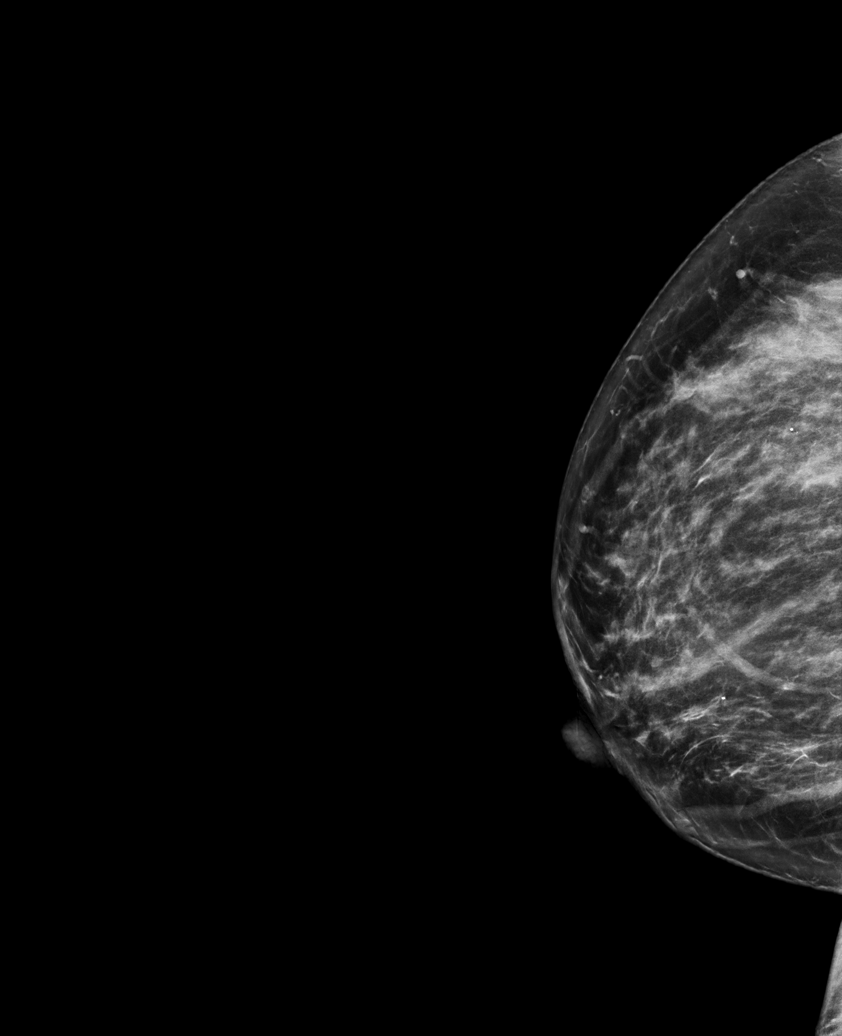

[R MLO tomo (1 of 2) · tomo slice 48/95.0]
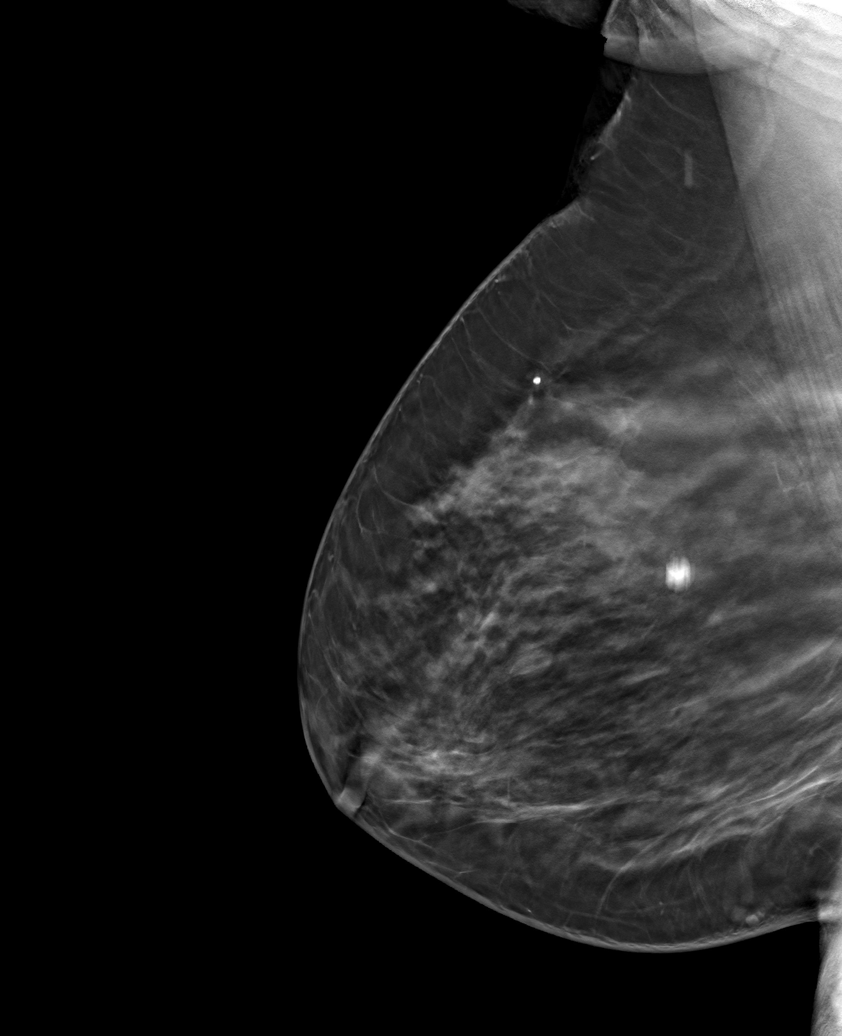

[R MLO tomo (2 of 2) · tomo slice 38/75.0]
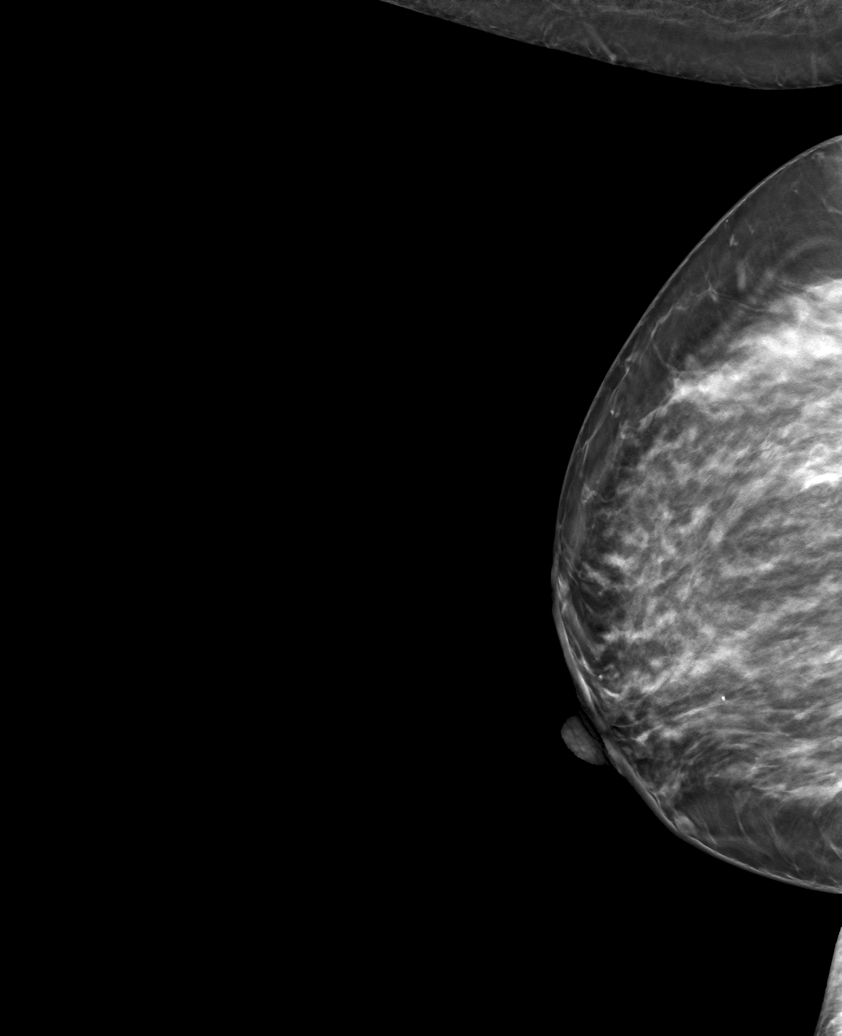

[R CC tomo · tomo slice 38/75.0]
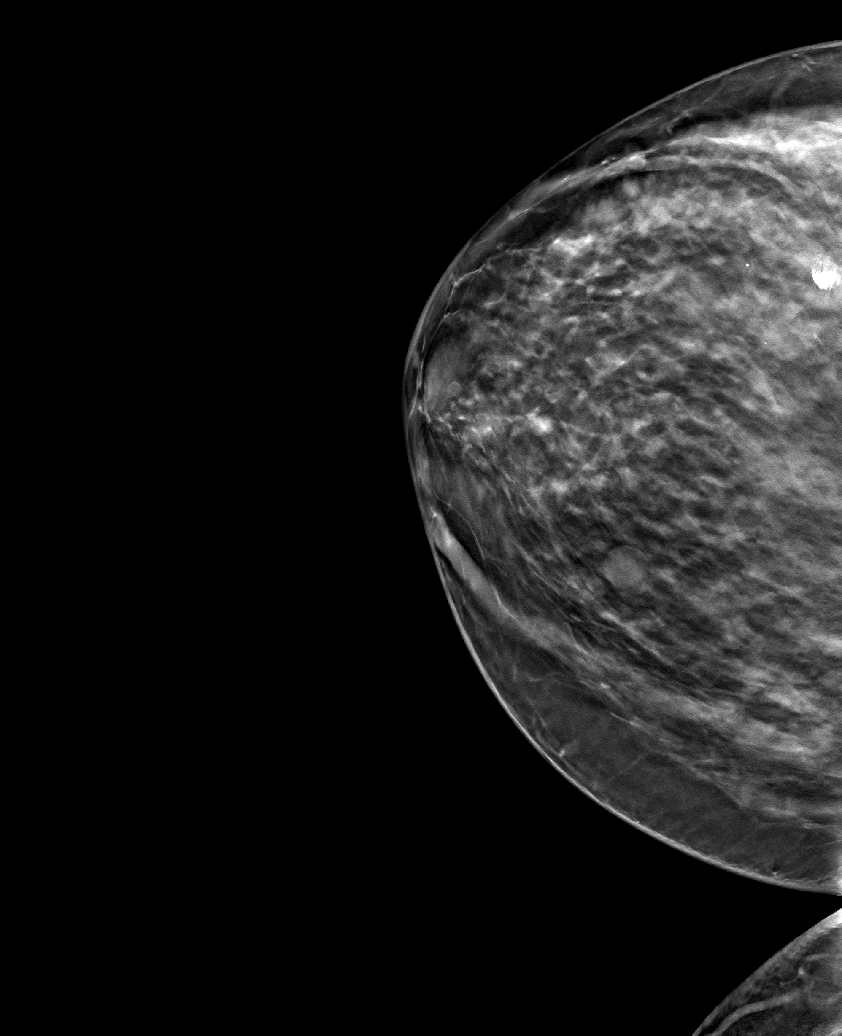

[6 of 18 positions shown; findings below may reference images not displayed]

ACR Breast Density Category c: The breast tissue is heterogeneously
dense, which may obscure small masses.
FINDINGS: There is an approximately 1.2 cm oval, circumscribed mass in the
upper inner quadrant of the right breast in the middle depth. This
is in approximately the 2 o'clock position of the breast. No
findings elsewhere in the right breast suspicious for malignancy.

Mammographic images were processed with CAD.

On physical exam, no mass is palpable in the upper inner right
breast or in the right axilla.

Targeted ultrasound is performed, showing a 1.4 x 1.1 x 0.6 cm oval,
obliquely oriented, circumscribed, bilobed, hypoechoic mass in the 1
o'clock position of the right breast, 7 cm from the nipple. This
corresponds to the mammographic mass and has internal blood flow
with power Doppler. There is an adjacent 0.6 x 0.5 x 0.3 cm
similar-appearing mass. The masses are 1 mm apart and span 1.7 cm.

Ultrasound of the right axilla demonstrated normal appearing right
axillary lymph.
IMPRESSION: Adjacent 1.4 and 0.6 cm indeterminate masses in the 1 o'clock
position of the right breast.

RECOMMENDATION:
Ultrasound-guided core needle biopsy of the 1.4 cm mass in the 1
o'clock position of the right breast. This has been discussed with
the patient and scheduled at [DATE] p.m. on 12/11/2018.

I have discussed the findings and recommendations with the patient.
Results were also provided in writing at the conclusion of the
visit. If applicable, a reminder letter will be sent to the patient
regarding the next appointment.

BI-RADS CATEGORY  4: Suspicious.

## 2021-01-09 IMAGING — US ULTRASOUND RIGHT BREAST LIMITED
1 series · 12 of 12 positions shown · non-contrast
Comparison: Previous exam(s).

CLINICAL DATA: 1 cm mass felt by the patient and on recent physical
examination in the 2 o'clock position of the right breast. The
patient cannot feel a mass today.

EXAM:
DIGITAL DIAGNOSTIC RIGHT MAMMOGRAM WITH CAD AND TOMO
ULTRASOUND RIGHT BREAST

[Series 1: ultrasound right breast limited · 0.08mm/px · 12 of 12 slices shown]
[im 1/12]
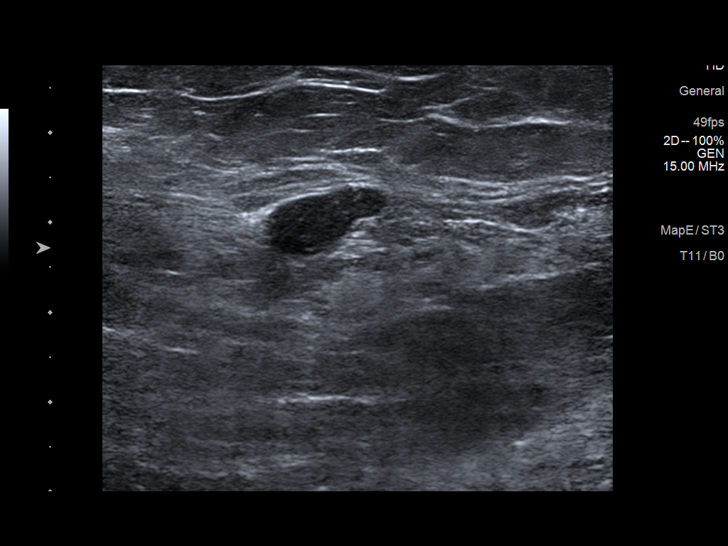
[im 2/12]
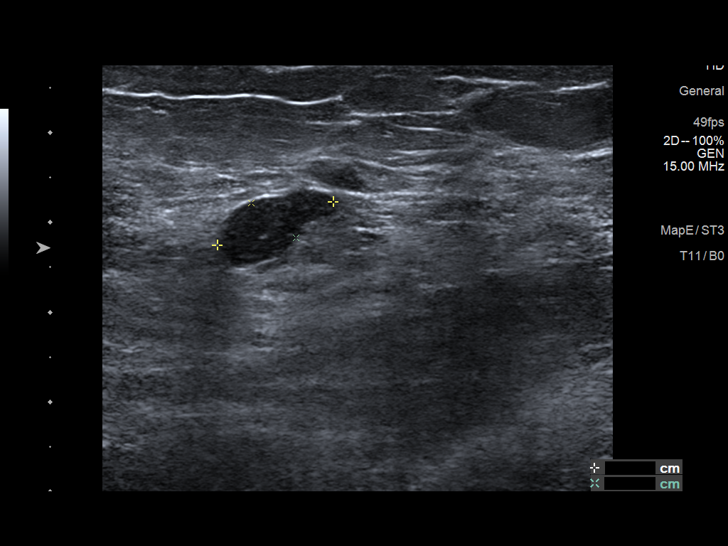
[im 3/12]
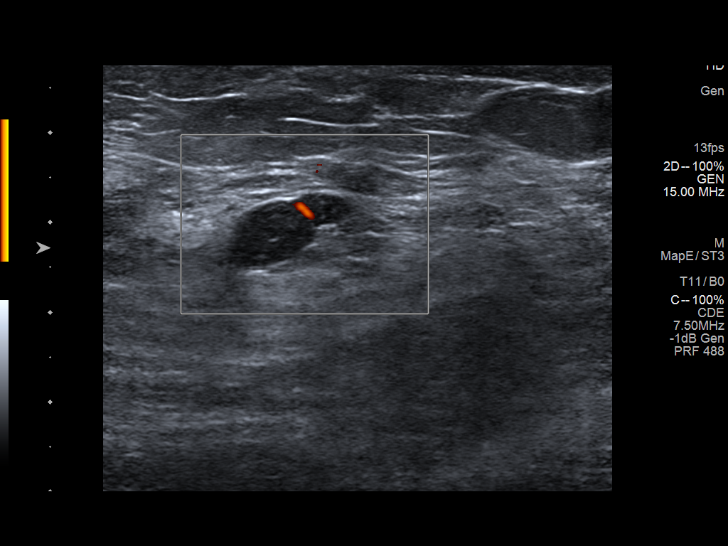
[im 4/12]
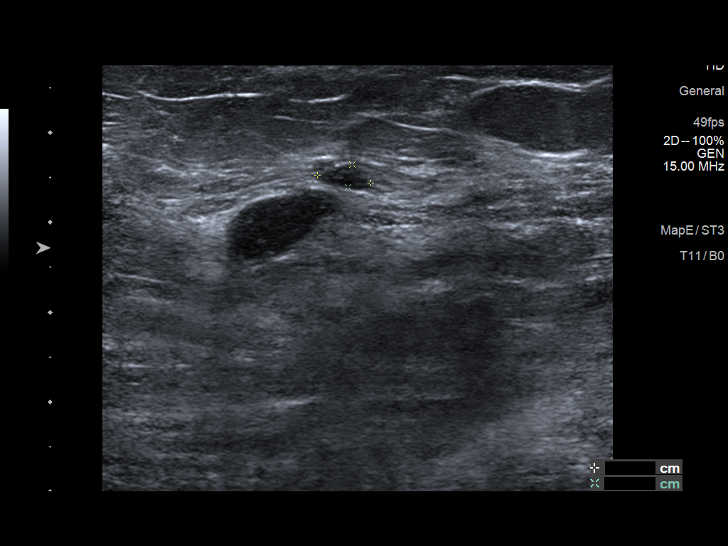
[im 5/12]
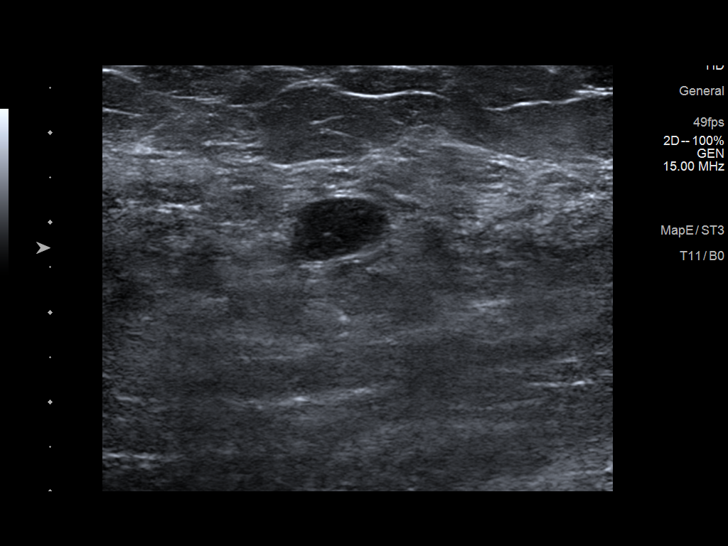
[im 6/12]
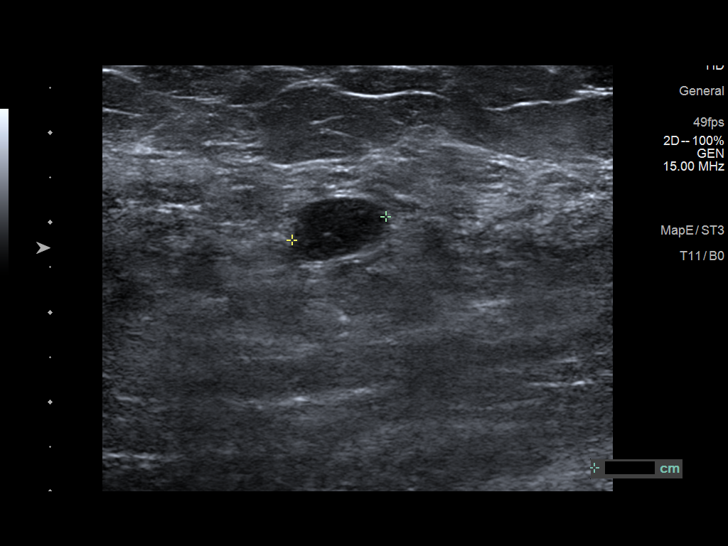
[im 7/12]
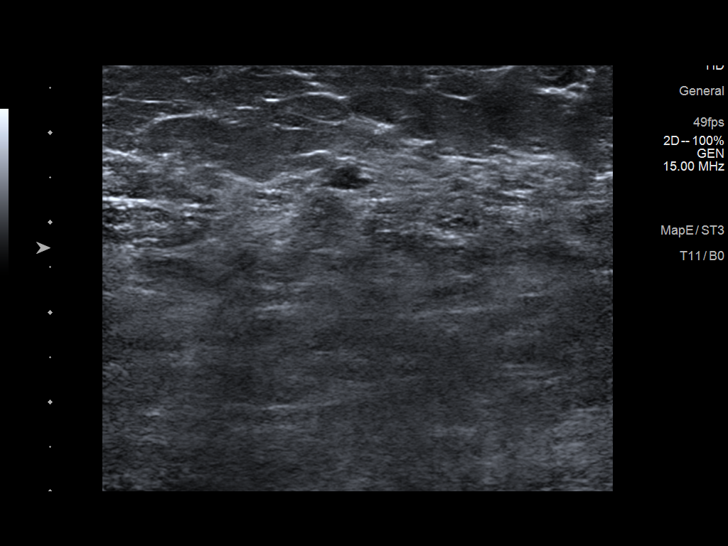
[im 8/12]
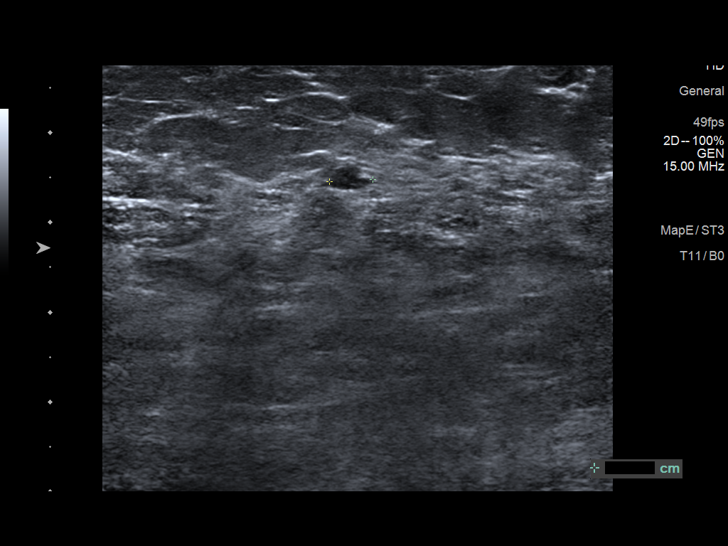
[im 9/12]
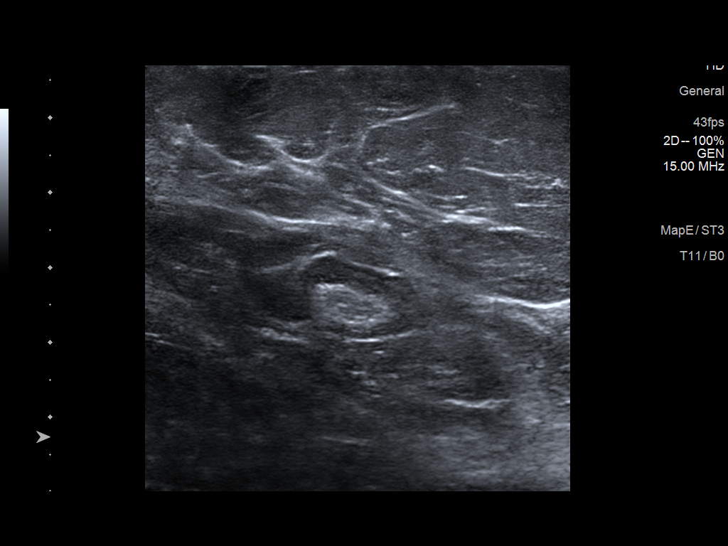
[im 10/12]
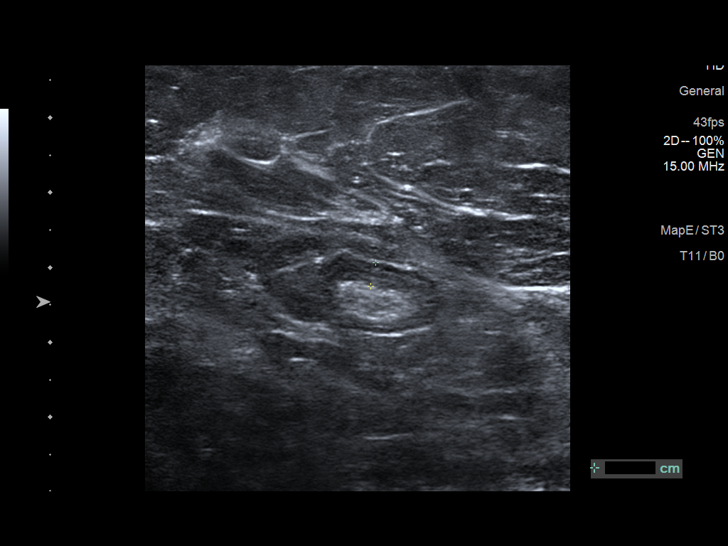
[im 11/12]
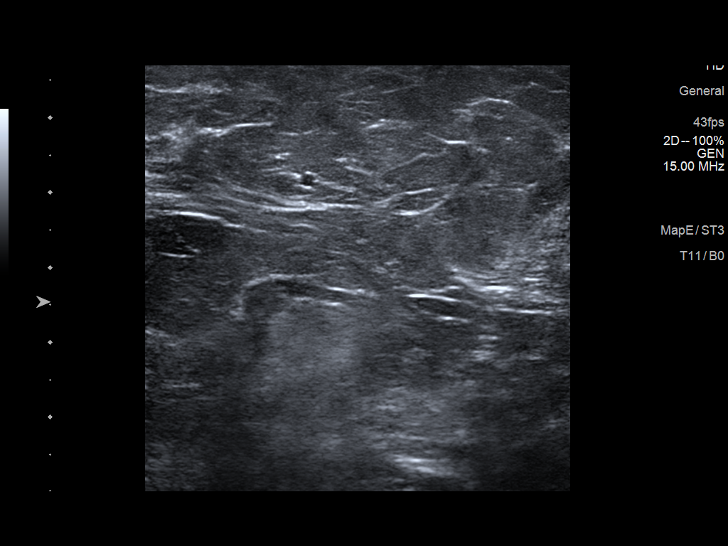
[im 12/12]
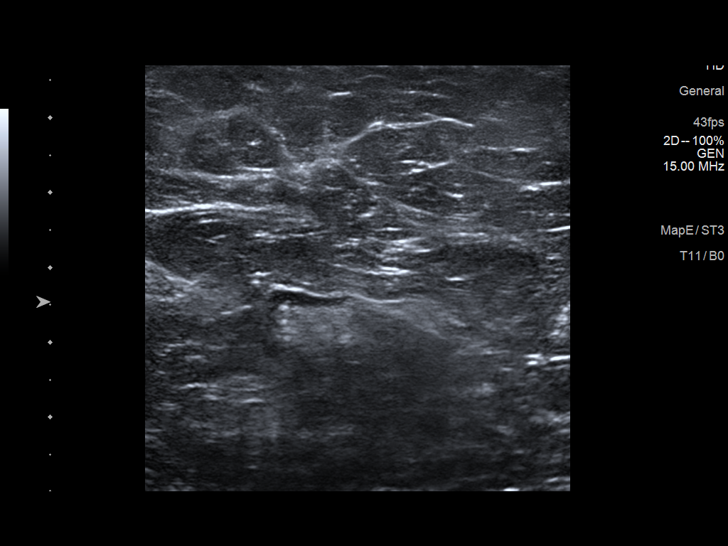

[12 of 12 positions shown; findings below may reference images not displayed]

ACR Breast Density Category c: The breast tissue is heterogeneously
dense, which may obscure small masses.
FINDINGS: There is an approximately 1.2 cm oval, circumscribed mass in the
upper inner quadrant of the right breast in the middle depth. This
is in approximately the 2 o'clock position of the breast. No
findings elsewhere in the right breast suspicious for malignancy.

Mammographic images were processed with CAD.

On physical exam, no mass is palpable in the upper inner right
breast or in the right axilla.

Targeted ultrasound is performed, showing a 1.4 x 1.1 x 0.6 cm oval,
obliquely oriented, circumscribed, bilobed, hypoechoic mass in the 1
o'clock position of the right breast, 7 cm from the nipple. This
corresponds to the mammographic mass and has internal blood flow
with power Doppler. There is an adjacent 0.6 x 0.5 x 0.3 cm
similar-appearing mass. The masses are 1 mm apart and span 1.7 cm.

Ultrasound of the right axilla demonstrated normal appearing right
axillary lymph.
IMPRESSION: Adjacent 1.4 and 0.6 cm indeterminate masses in the 1 o'clock
position of the right breast.

RECOMMENDATION:
Ultrasound-guided core needle biopsy of the 1.4 cm mass in the 1
o'clock position of the right breast. This has been discussed with
the patient and scheduled at [DATE] p.m. on 12/11/2018.

I have discussed the findings and recommendations with the patient.
Results were also provided in writing at the conclusion of the
visit. If applicable, a reminder letter will be sent to the patient
regarding the next appointment.

BI-RADS CATEGORY  4: Suspicious.

## 2021-01-13 IMAGING — US US BREAST BX W LOC DEV 1ST LESION IMG BX SPEC US GUIDE*R*
1 series · 10 of 10 positions shown · non-contrast
Comparison: Previous exam(s).
COMPARISON: Previous exam(s).

Addendum:
CLINICAL DATA: 57-year-old female with a suspicious mass in the
right breast at the 1 o'clock position.

EXAM:
ULTRASOUND GUIDED RIGHT BREAST CORE NEEDLE BIOPSY

[Series 1: us breast bx w loc dev 1st lesion img bx spec us g · 0.07mm/px · 10 of 10 slices shown]
[im 1/10]
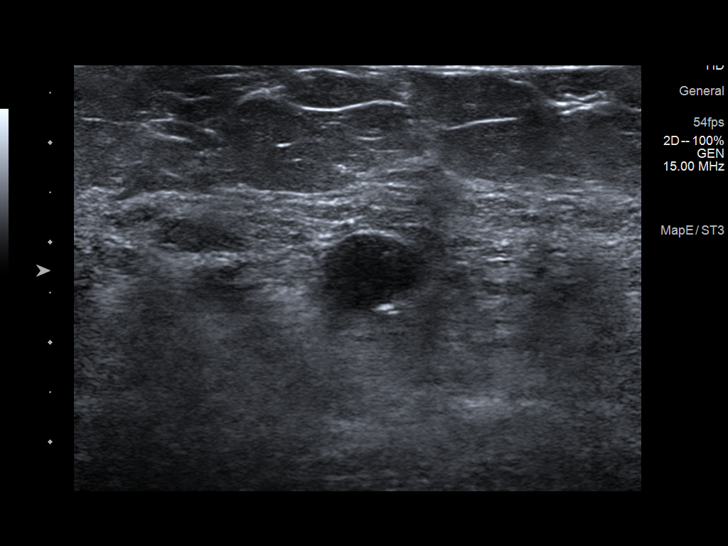
[im 2/10]
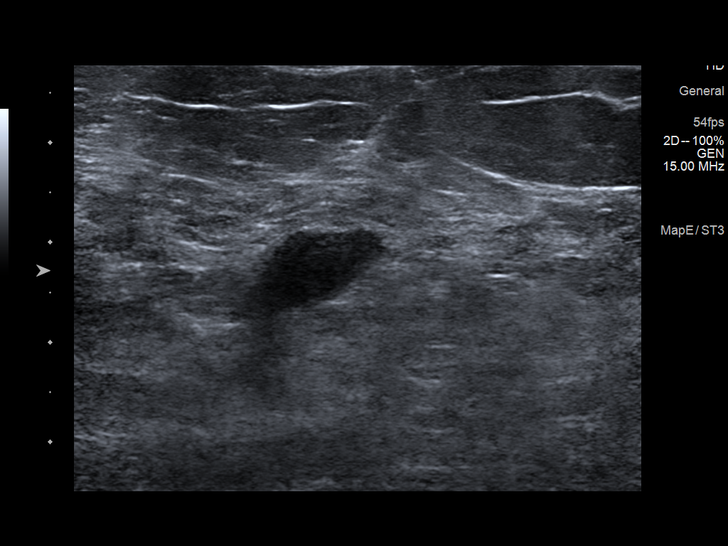
[im 3/10]
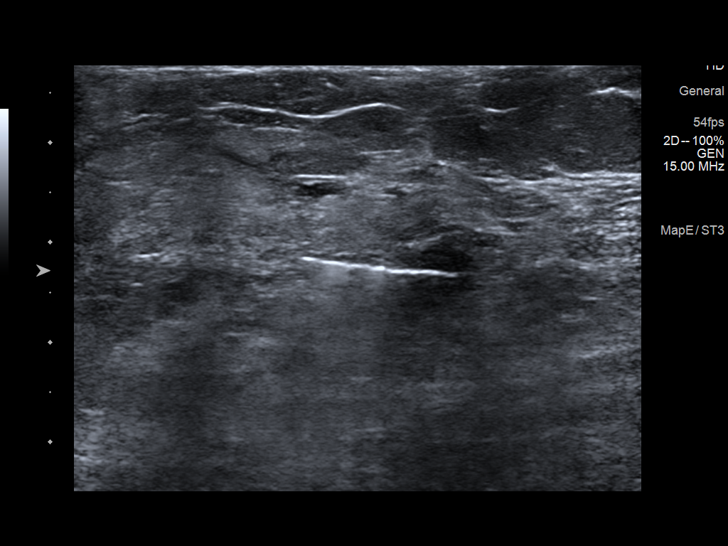
[im 4/10]
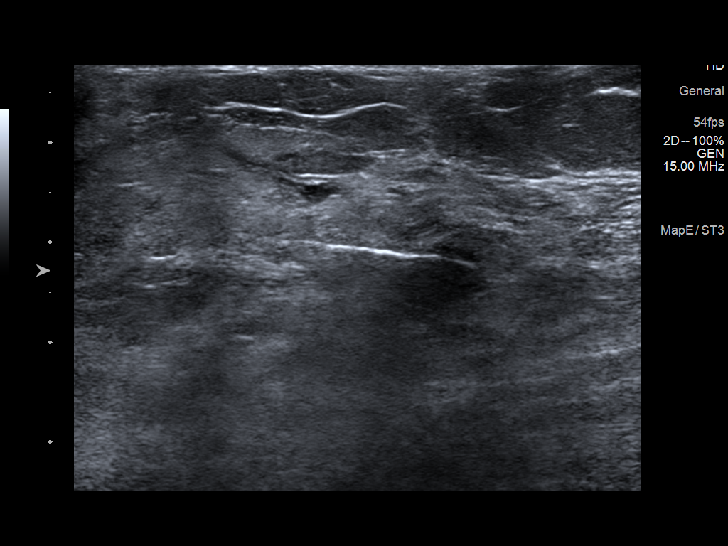
[im 5/10]
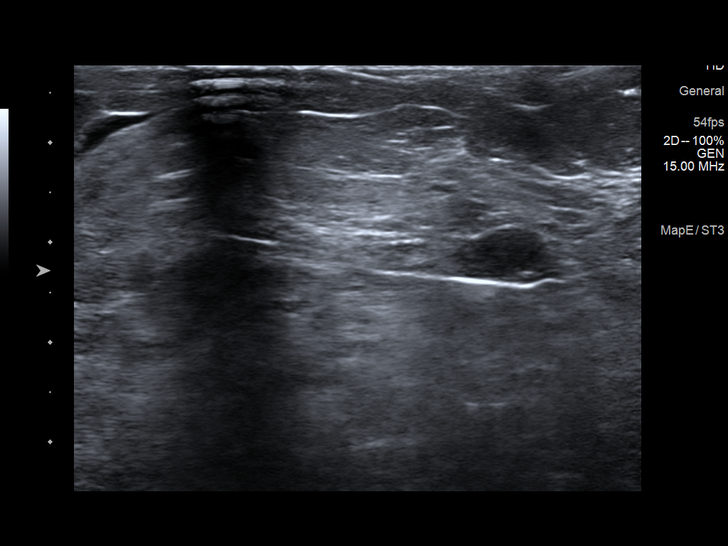
[im 6/10]
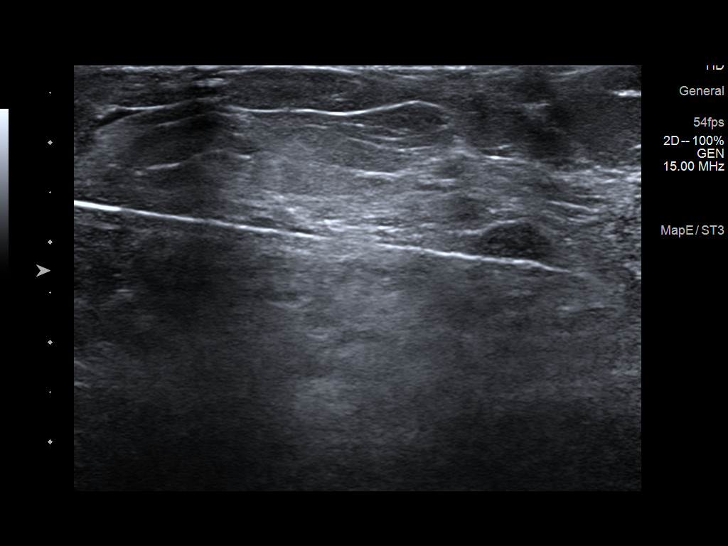
[im 7/10]
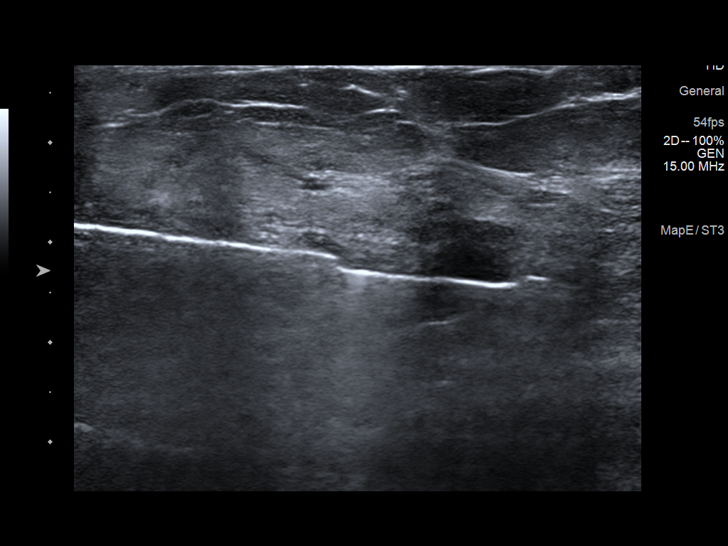
[im 8/10]
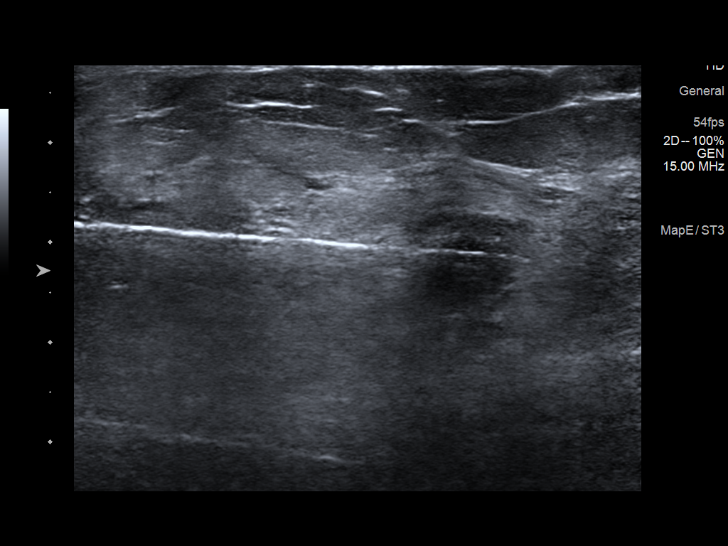
[im 9/10]
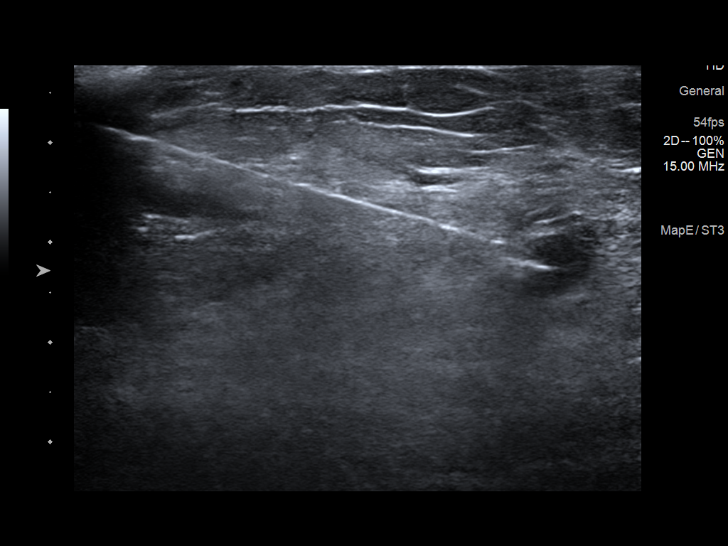
[im 10/10]
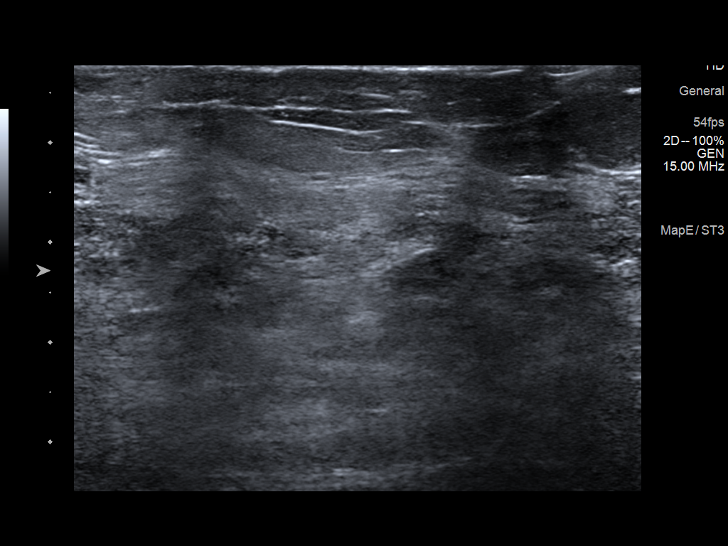

[10 of 10 positions shown; findings below may reference images not displayed]



Lesion quadrant: Upper inner

Using sterile technique and 1% Lidocaine as local anesthetic, under
direct ultrasound visualization, a 14 gauge Donghyeon device was
used to perform biopsy of the mass in the right breast at the 1
o'clock position using a lateral to medial approach. At the
conclusion of the procedure a ribbon shaped tissue marker clip was
deployed into the biopsy cavity. Follow up 2 view mammogram was
performed and dictated separately.
IMPRESSION: Ultrasound guided biopsy of the mass in the right breast at the 1
o'clock position. No apparent complications.

ADDENDUM:
Pathology revealed FIBROADENOMA, USUAL DUCTAL HYPERPLASIA of the
RIGHT breast, 1 o'clock. This was found to be concordant by Dr.
Gracynha Remor.

Pathology results were discussed with the patient by telephone. The
patient reported doing well after the biopsy with tenderness at the
site. Post biopsy instructions and care were reviewed and questions
were answered. The patient was encouraged to call The [REDACTED]

The patient was instructed to return for annual screening
mammography due February 2019, and informed a reminder notice would
be sent regarding this appointment.

Pathology results reported by Orvo Mandaha, RN on 12/13/2018.



Lesion quadrant: Upper inner

Using sterile technique and 1% Lidocaine as local anesthetic, under
direct ultrasound visualization, a 14 gauge Donghyeon device was
used to perform biopsy of the mass in the right breast at the 1
o'clock position using a lateral to medial approach. At the
conclusion of the procedure a ribbon shaped tissue marker clip was
deployed into the biopsy cavity. Follow up 2 view mammogram was
performed and dictated separately.
IMPRESSION: Ultrasound guided biopsy of the mass in the right breast at the 1
o'clock position. No apparent complications.

## 2021-01-13 IMAGING — MG MM CLIP PLACEMENT
4 series · 4 of 12 positions shown · non-contrast
Comparison: Previous exam(s).

CLINICAL DATA: Post ultrasound-guided biopsy of a mass in the right
breast at the 1 o'clock position.

EXAM:
DIAGNOSTIC RIGHT MAMMOGRAM POST ULTRASOUND BIOPSY

[R CC synth-2D]
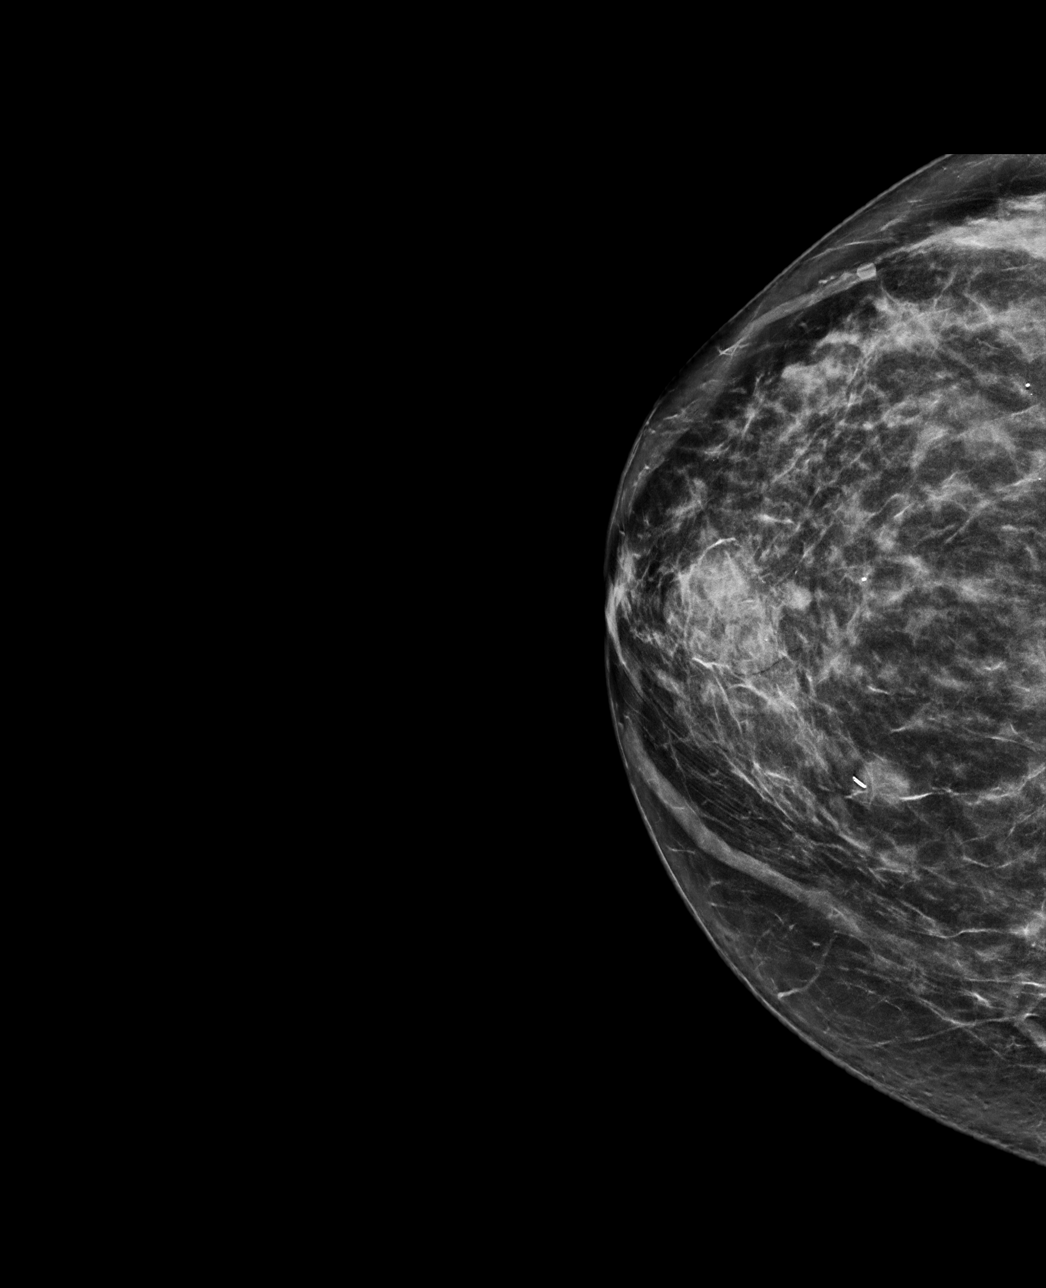

[R ML synth-2D]
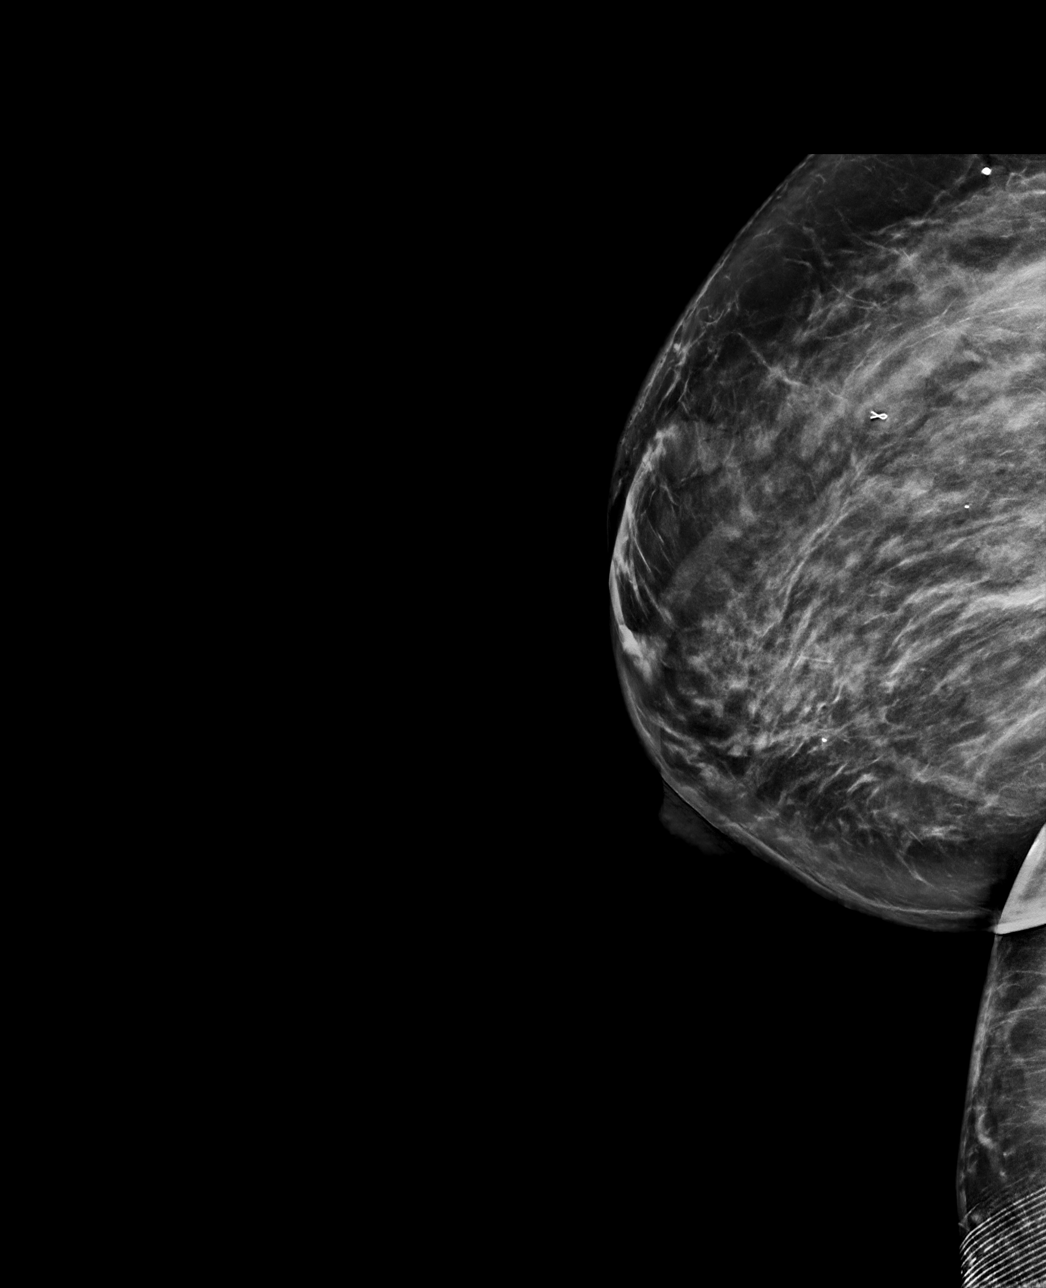

[R ML tomo · tomo slice 44/87.0]
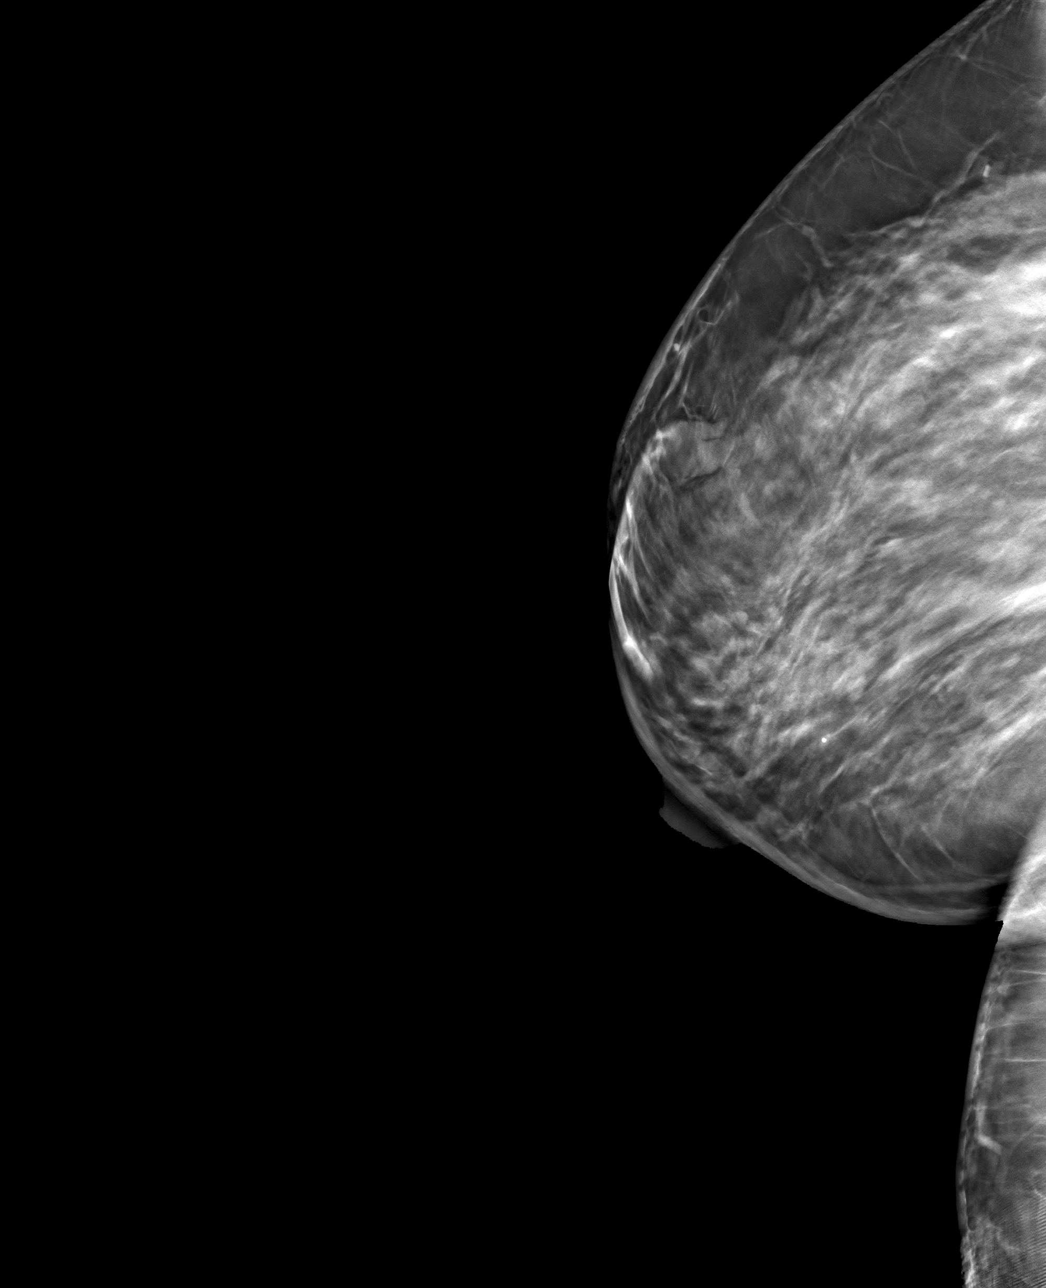

[R CC tomo · tomo slice 38/75.0]
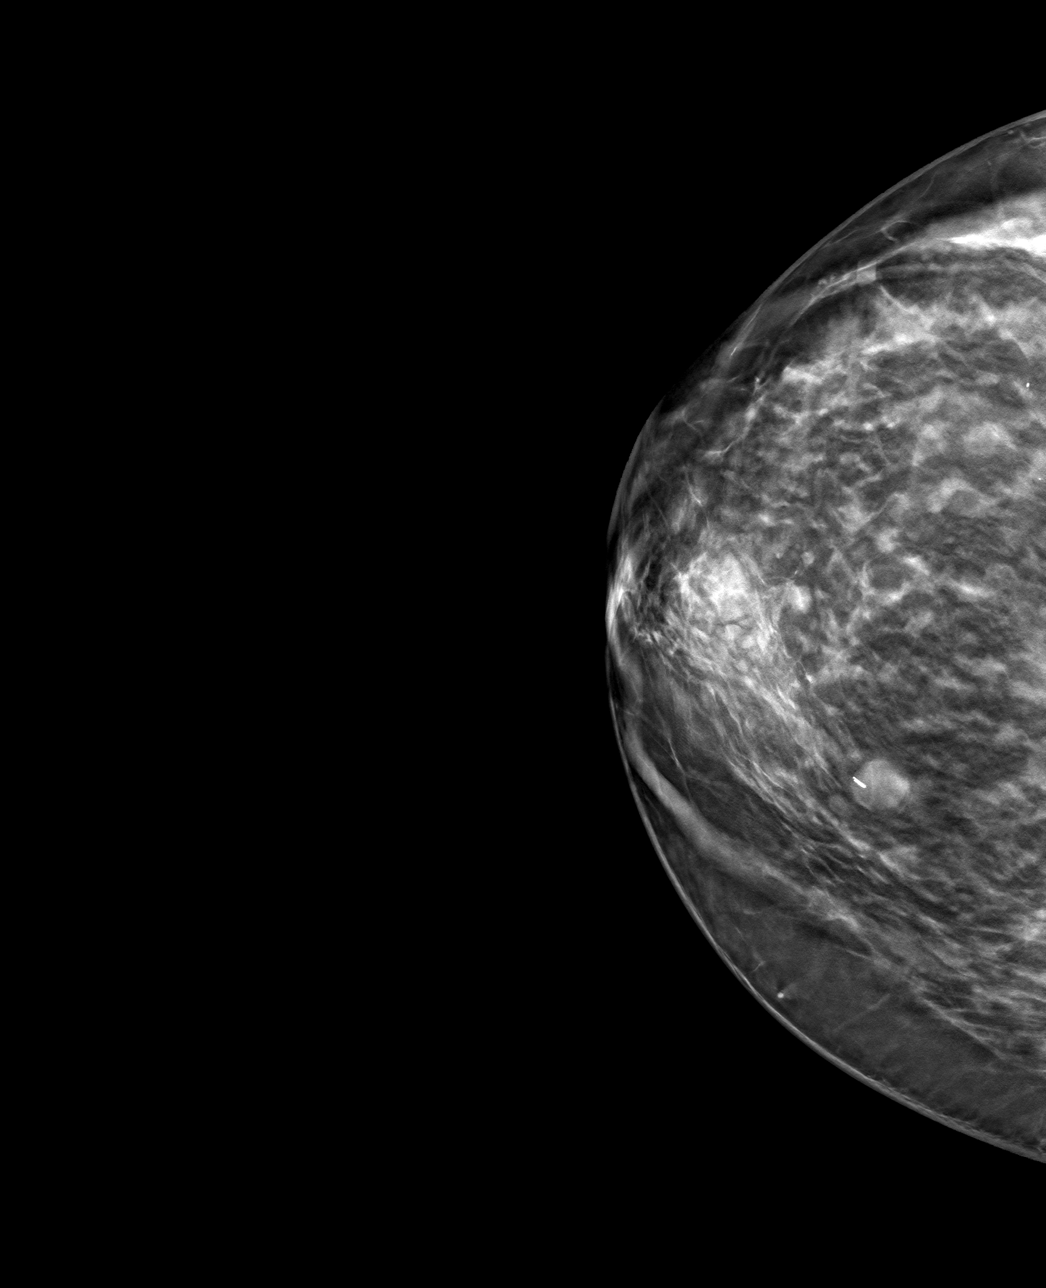

[4 of 12 positions shown; findings below may reference images not displayed]

FINDINGS: Mammographic images were obtained following ultrasound guided biopsy
of a mass in the right breast at the 1 o'clock position. A ribbon
shaped biopsy marking clip is present at the site of the biopsied
mass in the right breast at the 1 o'clock position.

The patient felt shaky following the biopsy. Her vital signs were
taken and remained stable with a blood pressure of 144/77 and heart
rate of 87 bpm. She was given water to drink and monitored in the
waiting area. When she was feeling better and feeling less shaky she
was taken to her sister's vehicle via wheelchair.
IMPRESSION: Ribbon shaped biopsy marking clip at site of biopsied mass in the
right breast at the 1 o'clock position.

Final Assessment: Post Procedure Mammograms for Marker Placement

## 2021-02-01 ENCOUNTER — Encounter: Payer: Self-pay | Admitting: Podiatry

## 2021-02-01 ENCOUNTER — Other Ambulatory Visit: Payer: Self-pay

## 2021-02-01 ENCOUNTER — Ambulatory Visit (INDEPENDENT_AMBULATORY_CARE_PROVIDER_SITE_OTHER): Payer: Medicare Other | Admitting: Podiatry

## 2021-02-01 DIAGNOSIS — M79676 Pain in unspecified toe(s): Secondary | ICD-10-CM

## 2021-02-01 DIAGNOSIS — E1142 Type 2 diabetes mellitus with diabetic polyneuropathy: Secondary | ICD-10-CM

## 2021-02-01 DIAGNOSIS — B351 Tinea unguium: Secondary | ICD-10-CM | POA: Diagnosis not present

## 2021-02-01 DIAGNOSIS — L84 Corns and callosities: Secondary | ICD-10-CM

## 2021-02-06 ENCOUNTER — Encounter (HOSPITAL_COMMUNITY): Payer: Self-pay | Admitting: *Deleted

## 2021-02-06 ENCOUNTER — Ambulatory Visit (HOSPITAL_COMMUNITY)
Admission: EM | Admit: 2021-02-06 | Discharge: 2021-02-06 | Disposition: A | Discharge status: Home / Self Care | Payer: Medicare Other | Attending: Student | Admitting: Student

## 2021-02-06 ENCOUNTER — Other Ambulatory Visit: Payer: Self-pay

## 2021-02-06 DIAGNOSIS — J0100 Acute maxillary sinusitis, unspecified: Secondary | ICD-10-CM

## 2021-02-06 DIAGNOSIS — J069 Acute upper respiratory infection, unspecified: Secondary | ICD-10-CM

## 2021-02-06 MED ORDER — DOXYCYCLINE HYCLATE 100 MG PO CAPS: 100.0000 mg | ORAL_CAPSULE | Freq: Two times a day (BID) | ORAL | 0 refills | Status: AC

## 2021-02-06 NOTE — Discharge Instructions (Addendum)
-  Doxycycline twice daily for 7 days.  Make sure to wear sunscreen while spending time outside while on this medication as it can increase your chance of sunburn. You can take this medication with food if you have a sensitive stomach.

## 2021-02-06 NOTE — ED Provider Notes (Signed)
[facility]    CSN: [id number] Arrival date & time: 02/06/21  1623      History   Chief Complaint Chief Complaint  Patient presents with   pain to side of nose    HPI BLAISE PALLADINO is a 60 y.o. female presenting with viral symptoms followed by sinus pressure.  Patient states that she initially had a cold with cough and congestion, this is improved but now with left-sided maxillary sinus tenderness.  Facial pressure, headaches.  Feeling well otherwise.  Has not taken medications for the symptoms.  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, with long-term current use of insulin ([place]) 06/29/2020   Type 2 diabetes mellitus with hyperglycemia, with long-term current use of insulin ([place]) 06/29/2020   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 ([place]) 03/31/2020   Pre-operative cardiovascular examination 02/18/2019   Septic bursitis of elbow, right 10/30/2018   CRI (chronic renal insufficiency), stage 3 (moderate) 07/26/2018   Morbid obesity ([place]) 07/26/2018   Intractable nausea and vomiting 03/06/2018   Right leg pain 12/19/2017   Leukocytosis 12/19/2017   Benign positional vertigo 12/19/2017   Weakness 12/12/2017   Laryngopharyngeal reflux (LPR) 10/19/2017   Post-nasal drainage 10/19/2017   Acute sinusitis 10/19/2017   Degeneration of lumbar intervertebral  disc 07/29/2017   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, left 03/08/2017   Obstructive sleep apnea on CPAP 02/09/2017   Chronic diastolic heart failure ([place]) 11/08/2016   Near syncope 01/30/2016   SOB (shortness of breath)    CAP (community acquired pneumonia) 09/12/2015   Hypoxia 09/12/2015   Essential hypertension 07/05/2015   Hypotension 07/05/2015   Diabetes mellitus with neurological manifestations ([place]) 11/04/2014   Hyperlipidemia LDL goal <70 11/04/2014   Spinal stenosis, multilevel 11/04/2014   Hyperkalemia 11/01/2014   Hypoglycemia 08/09/2014   Type 2 diabetes mellitus with complication, with long-term current use of insulin ([place]) 08/08/2014   Elevated troponin 08/08/2014   Nausea vomiting and diarrhea 08/08/2014   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   Dizziness 08/06/2012   Orthostatic hypotension 08/06/2012   Hypernatremia 08/06/2012   Acute gastroenteritis 08/13/2011   Gastroparesis 08/13/2011   Low back pain 08/13/2011   Oral thrush 08/13/2011   Gastroenteritis 05/23/2011   Dehydration 05/23/2011   Blindness 05/23/2011   DM type 1, not at goal, causing eye disease ([place]) 05/23/2011   Asthma 05/23/2011   Diarrhea 05/23/2011   Vomiting 05/23/2011   Hypothyroidism 05/23/2011   Diabetic neuropathy ([place]) 05/23/2011   Diabetic nephropathy ([place]) 05/23/2011    Past Surgical History:  Procedure Laterality Date   [place]   x 2  CHOLECYSTECTOMY  2008   ENUCLEATION Bilateral 09/15/1998   EYE SURGERY  2016   fitting artificial eye    OB History   No obstetric history on file.    Obstetric Comments  Pt has two sons.          Home Medications    Prior to Admission medications   Medication Sig Start Date End Date Taking? Authorizing Provider  doxycycline (VIBRAMYCIN) 100 MG  capsule Take 1 capsule (100 mg total) by mouth 2 (two) times daily for 7 days. 02/06/21 02/13/21 Yes Hazel Sams, PA-C  albuterol (PROVENTIL HFA;VENTOLIN HFA) 108 (90 BASE) MCG/ACT inhaler Inhale 2 puffs into the lungs every 4 (four) hours as needed for wheezing or shortness of breath. For short    [provider]  allopurinol (ZYLOPRIM) 300 MG tablet Take 300 mg by mouth 2 (two) times daily. 11/23/20   [provider]  aspirin EC 81 MG tablet Take 81 mg by mouth daily.    [provider]  blood glucose meter kit and supplies Dispense based on patient and insurance preference. Use up to four times daily as directed. (FOR ICD-10 E10.9, E11.9). 08/06/20   Wendie Agreste, MD  cetirizine (ZYRTEC) 10 MG tablet Take 10 mg by mouth daily.    [provider]  clonazePAM (KLONOPIN) 0.5 MG tablet Take 0.5 mg by mouth daily. 11/23/20   [provider]  colchicine 0.6 MG tablet Take by mouth. 12/14/20   [provider]  Continuous Blood Gluc Receiver (FREESTYLE LIBRE 2 READER) DEVI As instructed. iddm with complications - retinopathy, neuropathy, nephropathy. With device and sensors. 08/06/20   Wendie Agreste, MD  cyclobenzaprine (FLEXERIL) 5 MG tablet 1 pill by mouth up to every 8 hours as needed. Start with one pill by mouth each bedtime as needed due to sedation 07/10/20   Wendie Agreste, MD  Dulaglutide (TRULICITY) 1.5 FT/7.3UK SOPN Inject 1.5 mg into the skin once a week. 09/18/20   Shamleffer, Melanie Crazier, MD  EMBRACE TALK GLUCOSE TEST test strip USE TO CHECK BLOOD SUGARS FOUR TIMES A DAY BEFORE MEALS AND AT BEDTIME DX E11.29 07/29/20   Wendie Agreste, MD  esomeprazole (NEXIUM) 40 MG capsule Take 1 capsule (40 mg total) by mouth daily. 07/10/20   Bensimhon, Shaune Pascal, MD  gabapentin (NEURONTIN) 300 MG capsule TAKE 1 CAPSULE (300 MG TOTAL) BY MOUTH 3 (THREE) TIMES DAILY. 12/21/20   Hilts, Legrand Como, MD  HUMALOG KWIKPEN 100 UNIT/ML KwikPen Max Daily 30  units 09/18/20   Shamleffer, Melanie Crazier, MD  hydrOXYzine (ATARAX/VISTARIL) 25 MG tablet Take 0.5 tablets (12.5 mg total) by mouth every 8 (eight) hours as needed for itching. 12/05/20   Jaynee Eagles, PA-C  Insulin Pen Needle (PEN NEEDLES) 31G X 8 MM MISC Inject 1 Device into the skin in the morning, at noon, in the evening, and at bedtime. 07/01/20   Shamleffer, Melanie Crazier, MD  levothyroxine (SYNTHROID) 150 MCG tablet Take 1 tablet (150 mcg total) by mouth daily. Needs appt for future refills 08/06/20   Wendie Agreste, MD  linaclotide [facility]) 145 MCG CAPS capsule Take 145 mcg by mouth daily as needed (constipation).     [provider]  naproxen (NAPROSYN) 500 MG tablet Take 500 mg by mouth 2 (two) times daily with a meal.    [provider]  ondansetron (ZOFRAN ODT) 4 MG disintegrating tablet Take 1 tablet (4 mg total) by mouth every 8 (eight) hours as needed for  nausea or vomiting. 07/09/19   Antonietta Breach, PA-C  potassium chloride SA (KLOR-CON) 20 MEQ tablet Take 1 tablet (20 mEq total) by mouth daily. Need appt for future refills 08/06/20   Wendie Agreste, MD  predniSONE (STERAPRED UNI-PAK 21 TAB) 5 MG (21) TBPK tablet Take by mouth as directed. 11/23/20   [provider]  Prodigy Twist Top Lancets 28G MISC USE TO [place] BLOOD SUGARS 10/02/20   [provider]  simvastatin (ZOCOR) 20 MG tablet Take 1 tablet (20 mg total) by mouth every evening. 08/06/20   Wendie Agreste, MD  spironolactone (ALDACTONE) 25 MG tablet Take 0.5 tablets (12.5 mg total) by mouth daily. Needs appt for future refills 08/06/20   Wendie Agreste, MD  tiZANidine (ZANAFLEX) 2 MG tablet Take 2 mg by mouth 2 (two) times daily. 10/02/20   [provider]  torsemide (DEMADEX) 20 MG tablet Take 4 tablets (80 mg total) by mouth daily. Need appt for future refills 08/06/20   Wendie Agreste, MD  TRESIBA FLEXTOUCH 200 UNIT/ML FlexTouch Pen Inject 22 Units into the skin daily. 09/18/20    Shamleffer, Melanie Crazier, MD    Family History Family History  Problem Relation Age of Onset   Diabetes Sister    Glaucoma Sister    Hypertension Sister    Healthy Mother    Healthy Father    Stroke Brother    Heart attack Paternal Aunt    Breast cancer Neg Hx     Social History Social History   Tobacco Use   Smoking status: Former    Types: Cigarettes    Quit date: 05/22/1992    Years since quitting: 28.7   Smokeless tobacco: Never  Vaping Use   Vaping Use: Never used  Substance Use Topics   Alcohol use: No    Comment: quit 20 yrs ago   Drug use: No     Allergies   Penicillins, Tramadol, Vicodin [hydrocodone-acetaminophen], Codeine, Iodine-131, Iohexol, Lisinopril, and Sulfa antibiotics   Review of Systems Review of Systems  Constitutional:  Negative for appetite change, chills and fever.  HENT:  Positive for sinus pain. Negative for congestion, ear pain, rhinorrhea, sinus pressure and sore throat.   Eyes:  Negative for redness and visual disturbance.  Respiratory:  Negative for cough, chest tightness, shortness of breath and wheezing.   Cardiovascular:  Negative for chest pain and palpitations.  Gastrointestinal:  Negative for abdominal pain, constipation, diarrhea, nausea and vomiting.  Genitourinary:  Negative for dysuria, frequency and urgency.  Musculoskeletal:  Negative for myalgias.  Neurological:  Negative for dizziness, weakness and headaches.  Psychiatric/Behavioral:  Negative for confusion.   All other systems reviewed and are negative.   Physical Exam Triage Vital Signs ED Triage Vitals  Enc Vitals Group     BP      Pulse      Resp      Temp      Temp src      SpO2      Weight      Height      Head Circumference      Peak Flow      Pain Score      Pain Loc      Pain Edu?      Excl. in [place]?    No data found.  Updated Vital Signs BP 103/62   Pulse 75   Temp 98.8 F (37.1 C)   Resp 18   SpO2 95%  Visual Acuity Right Eye  Distance:   Left Eye Distance:   Bilateral Distance:    Right Eye Near:   Left Eye Near:    Bilateral Near:     Physical Exam Vitals reviewed.  Constitutional:      General: She is not in acute distress.    Appearance: Normal appearance. She is not ill-appearing.  HENT:     Head: Normocephalic and atraumatic.     Right Ear: Tympanic membrane, ear canal and external ear normal. No tenderness. No middle ear effusion. There is no impacted cerumen. Tympanic membrane is not perforated, erythematous, retracted or bulging.     Left Ear: Tympanic membrane, ear canal and external ear normal. No tenderness.  No middle ear effusion. There is no impacted cerumen. Tympanic membrane is not perforated, erythematous, retracted or bulging.     Nose: No congestion.     Right Sinus: No maxillary sinus tenderness or frontal sinus tenderness.     Left Sinus: Maxillary sinus tenderness present. No frontal sinus tenderness.     Mouth/Throat:     Mouth: Mucous membranes are moist.     Pharynx: Uvula midline. No oropharyngeal exudate or posterior oropharyngeal erythema.  Eyes:     Pupils: Pupils are equal, round, and reactive to light.     Comments: Visually impaired at baseline  Cardiovascular:     Rate and Rhythm: Normal rate and regular rhythm.     Heart sounds: Normal heart sounds.  Pulmonary:     Effort: Pulmonary effort is normal.     Breath sounds: Normal breath sounds. No decreased breath sounds, wheezing, rhonchi or rales.  Abdominal:     Palpations: Abdomen is soft.     Tenderness: There is no abdominal tenderness. There is no guarding or rebound.  Neurological:     General: No focal deficit present.     Mental Status: She is alert and oriented to person, place, and time.  Psychiatric:        Mood and Affect: Mood normal.        Behavior: Behavior normal.        Thought Content: Thought content normal.        Judgment: Judgment normal.     UC Treatments / Results  Labs (all labs  ordered are listed, but only abnormal results are displayed) Labs Reviewed - No data to display  EKG   Radiology No results found.  Procedures Procedures (including critical care time)  Medications Ordered in UC Medications - No data to display  Initial Impression / Assessment and Plan / UC Course  I have reviewed the triage vital signs and the nursing notes.  Pertinent labs & imaging results that were available during my care of the patient were reviewed by me and considered in my medical decision making (see chart for details).     This patient is a very pleasant 60 y.o. year old female presenting with acute maxillary sinusitis following viral URI.  Afebrile, nontachycardic.  Penicillin allergic, so doxycycline sent. ED return precautions discussed. Patient verbalizes understanding and agreement.    Final Clinical Impressions(s) / UC Diagnoses   Final diagnoses:  Acute non-recurrent maxillary sinusitis  Recent URI     Discharge Instructions      -Doxycycline twice daily for 7 days.  Make sure to wear sunscreen while spending time outside while on this medication as it can increase your chance of sunburn. You can take this medication with food if you have a sensitive stomach.  ED Prescriptions     Medication Sig Dispense Auth. Provider   doxycycline (VIBRAMYCIN) 100 MG capsule Take 1 capsule (100 mg total) by mouth 2 (two) times daily for 7 days. 14 capsule Hazel Sams, PA-C      PDMP not reviewed this encounter.   Hazel Sams, PA-C 02/06/21 1723

## 2021-02-06 NOTE — Progress Notes (Signed)
  Subjective:  Patient ID: [name], female    DOB: June 11, 1960,  MRN: [name]  60 y.o. female presents at risk foot care with history of diabetic neuropathy and callus(es) bilateral feet and painful thick toenails that are difficult to trim. Painful toenails interfere with ambulation. Aggravating factors include wearing enclosed shoe gear. Pain is relieved with periodic professional debridement. Painful calluses are aggravated when weightbearing with and without shoegear. Pain is relieved with periodic professional debridement.  Patient states blood glucose was 132 mg/dl today.  PCP is Roselee Nova, MD , and last visit was two months ago.  Allergies  Allergen Reactions   Penicillins Hives and Swelling    Has patient had a PCN reaction causing immediate rash, facial/tongue/throat swelling, SOB or lightheadedness with hypotension: yes- face swelling Has patient had a PCN reaction causing severe rash involving mucus membranes or skin necrosis: no Has patient had a PCN reaction that required hospitalization unknown (childhood allergy) Has patient had a PCN reaction occurring within the last 10 years: no If all of the above answers are "NO", then may proceed with Cephalosporin use.    Tramadol Nausea And Vomiting    Intense nausea   Vicodin [Hydrocodone-Acetaminophen] Nausea And Vomiting   Codeine Nausea Only   Iodine-131 Hives   Iohexol Hives    Pt developed itching and hives along with nasal congestion; needs 13 hour premeds for future studies, Onset Date: [id number]    Lisinopril Cough   Sulfa Antibiotics Nausea And Vomiting    Review of Systems: Negative except as noted in the HPI.   Objective:  Vascular Examination: CFT immediate <3 seconds b/l. Faintly palpable pedal pulses b/l. Pedal hair sparse b/l. Trace edema b/l LE.  No pain with calf compression b/l. Skin temperature gradient WNL b/l.   Neurological Examination: Sensation grossly intact b/l with 10 gram  monofilament. Vibratory sensation intact b/l. Patient has subjective symptoms of neuropathy.  Dermatological Examination: Pedal skin with normal turgor, texture and tone b/l. Toenails 1-5 b/l thick, discolored, elongated with subungual debris and pain on dorsal palpation. Hyperkeratotic lesion(s) posterior aspect of heel b/l feet.  No erythema, no edema, no drainage, no fluctuance.  Musculoskeletal Examination: Muscle strength 5/5 to b/l LE. HAV with bunion b/l. Hammertoes 2-5 b/l. Patient in wheelchair.  Radiographs: None Assessment:   1. Pain due to onychomycosis of toenail   2. Callus   3. Diabetic peripheral neuropathy associated with type 2 diabetes mellitus ([place])    Plan:  -Examined patient. -Continue diabetic foot care principles: inspect feet daily, monitor glucose as recommended by PCP and/or Endocrinologist, and follow prescribed diet per PCP, Endocrinologist and/or dietician. -Patient to continue soft, supportive shoe gear daily. -Toenails 1-5 b/l were debrided in length and girth with sterile nail nippers and dremel without iatrogenic bleeding.  -Callus(es) posterior aspect of heel b/l feet pared utilizing sterile scalpel blade without complication or incident. Total number debrided =2. -Patient to report any pedal injuries to medical professional immediately. -Patient/POA to call should there be question/concern in the interim.  Return in about 9 weeks (around 04/05/2021).  Marzetta Board, DPM

## 2021-02-06 NOTE — ED Triage Notes (Signed)
Pt reports pain and swelling to Lt side of her nose.

## 2021-02-10 IMAGING — CT CT CERVICAL SPINE WITHOUT CONTRAST
3 of 4 series · 13 of 35 positions shown, 16 images · non-contrast
Comparison: 12/12/2017 cervical MRI

CLINICAL DATA: Cervical stenosis. Right-sided neck pain and
swelling

EXAM:
CT CERVICAL SPINE WITHOUT CONTRAST
TECHNIQUE: Multidetector CT imaging of the cervical spine was performed without
intravenous contrast. Multiplanar CT image reconstructions were also
generated.

[Series 4: c_spine 2.0 st · axial · 0.30mm/px · z∈[-194,-96]mm · 5 of 75 slices shown, 7 images]
[im 13/75  soft-tissue]
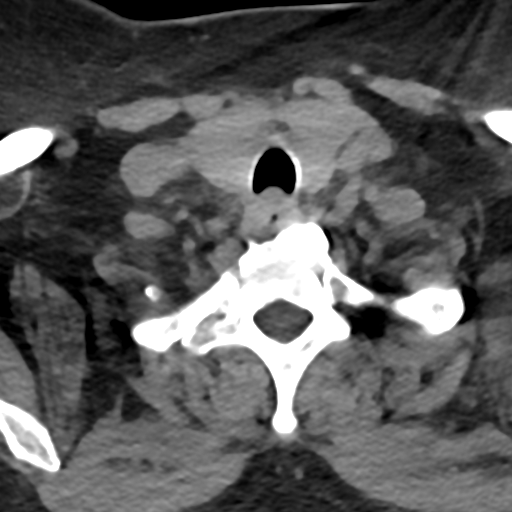
[im 13/75  bone]
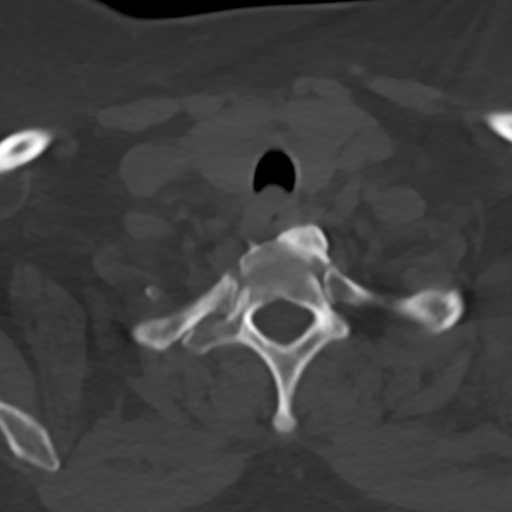
[im 25/75  bone]
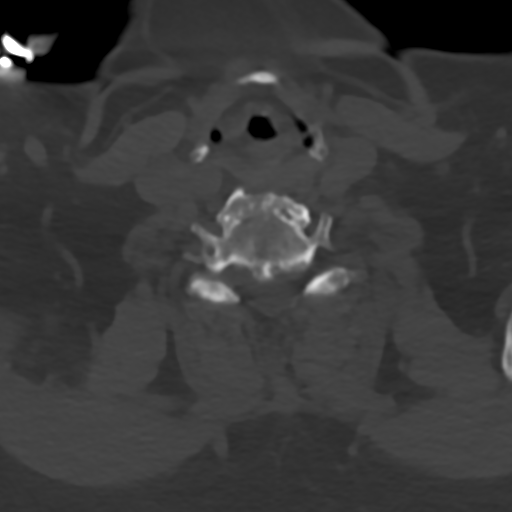
[im 38/75  bone]
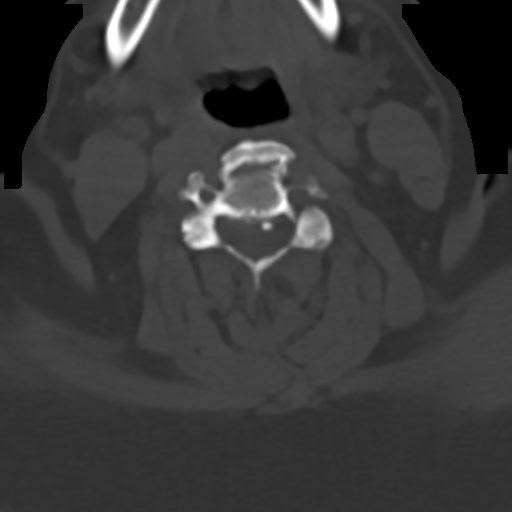
[im 50/75  bone]
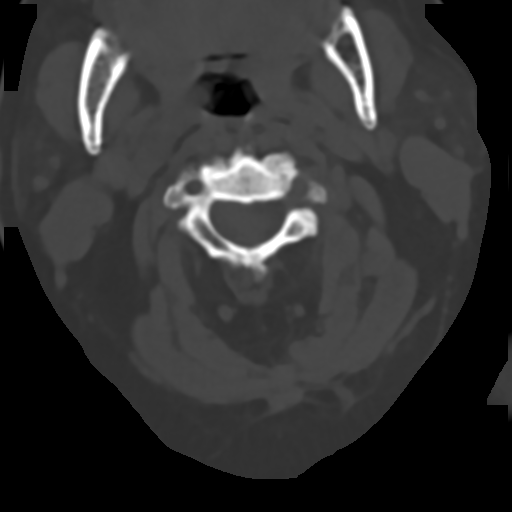
[im 62/75  soft-tissue]
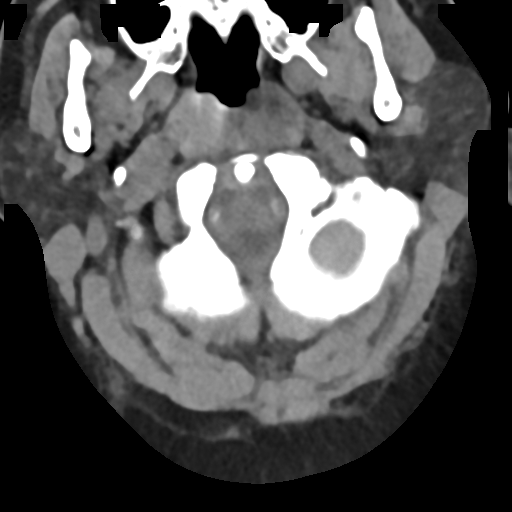
[im 62/75  bone]
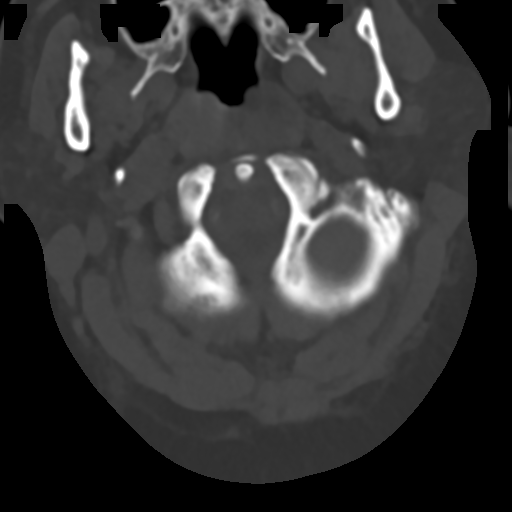

[Series 6: c_spine 2.0 sag bone · sagittal · 0.23mm/px · 5 of 61 slices shown, 6 images]
[im 21/61  bone]
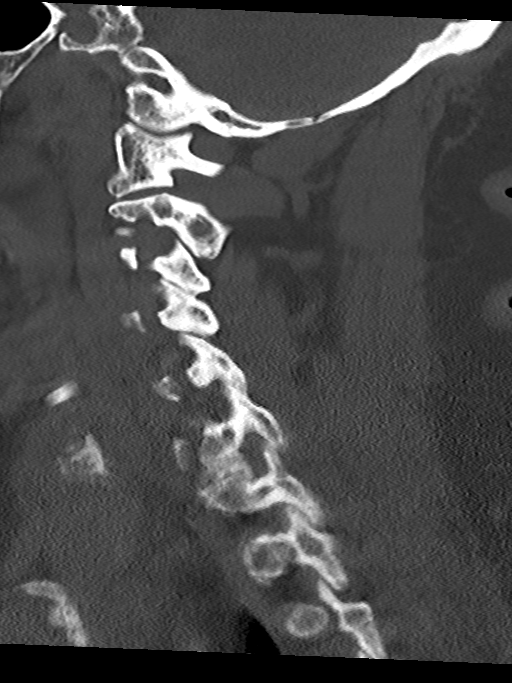
[im 26/61  bone]
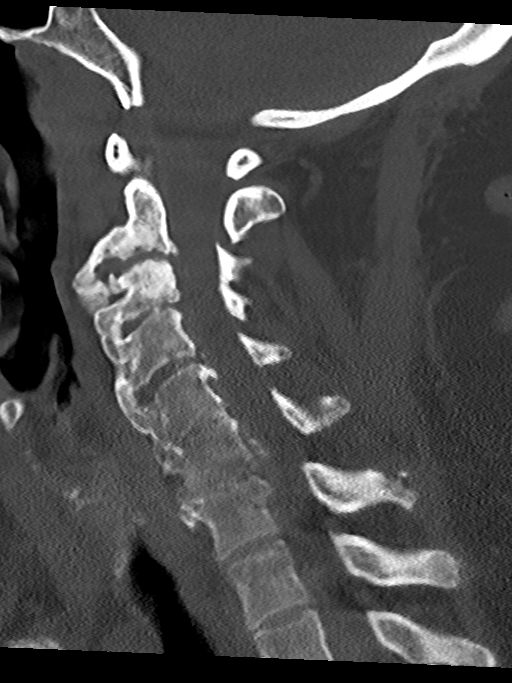
[im 31/61  soft-tissue]
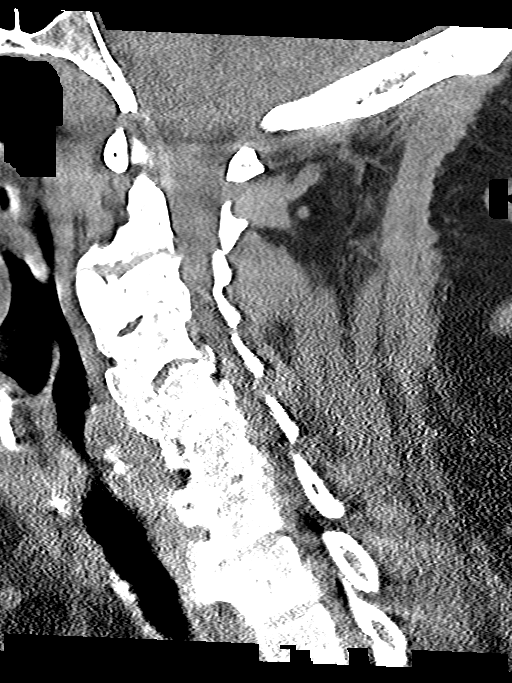
[im 31/61  bone]
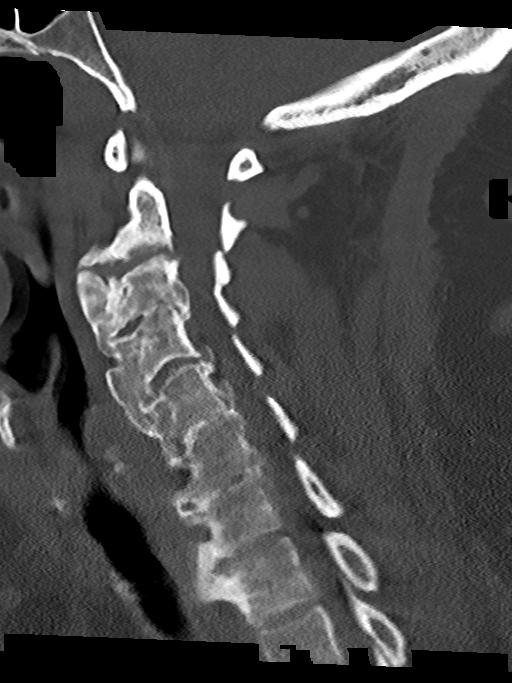
[im 36/61  bone]
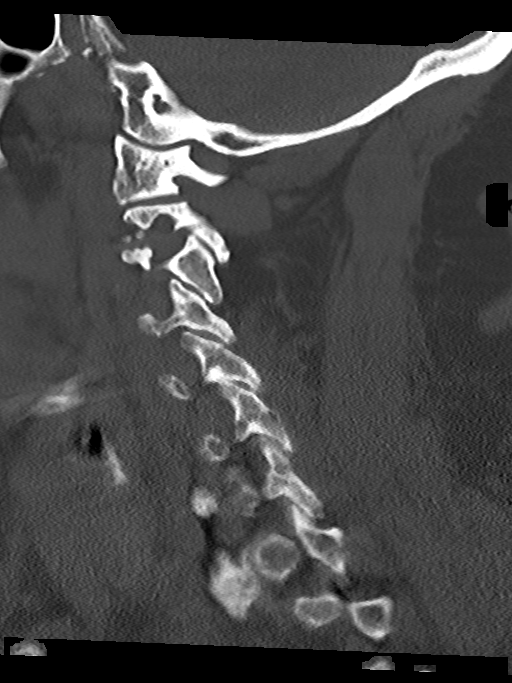
[im 41/61  bone]
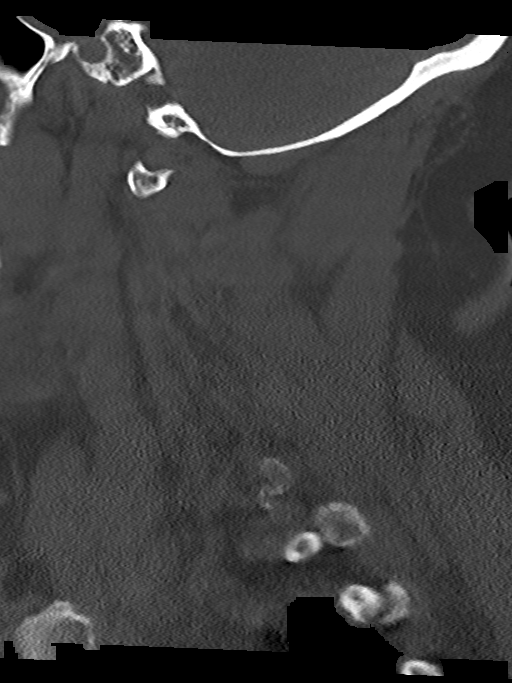

[Series 7: c_spine 2.0 cor bone · coronal · 0.23mm/px · 3 of 61 slices shown]
[im 15/61  bone]
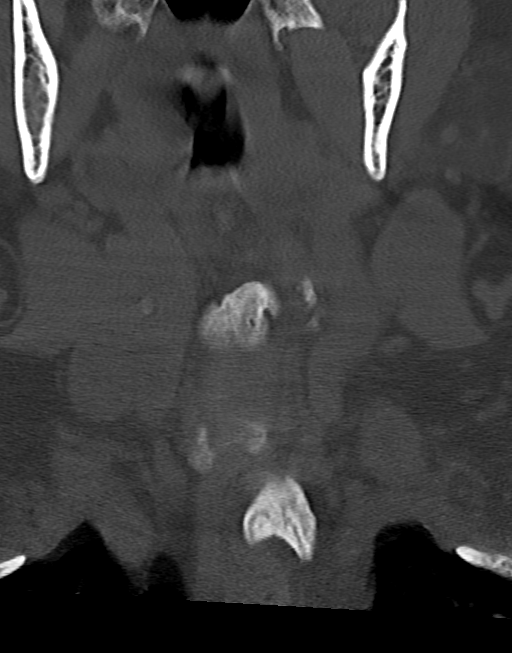
[im 25/61  bone]
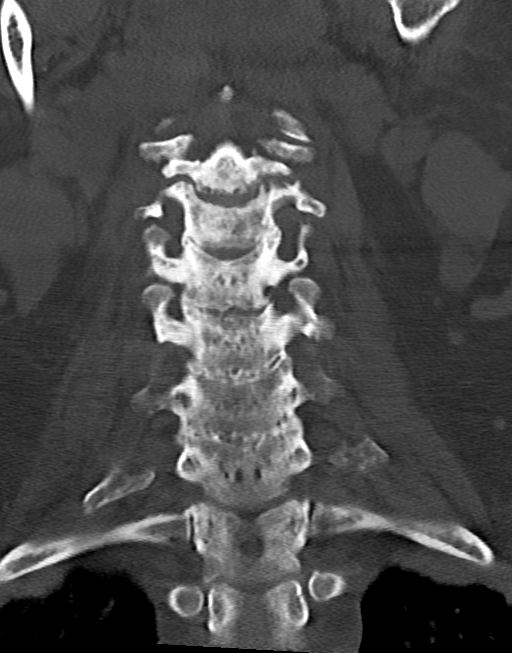
[im 36/61  bone]
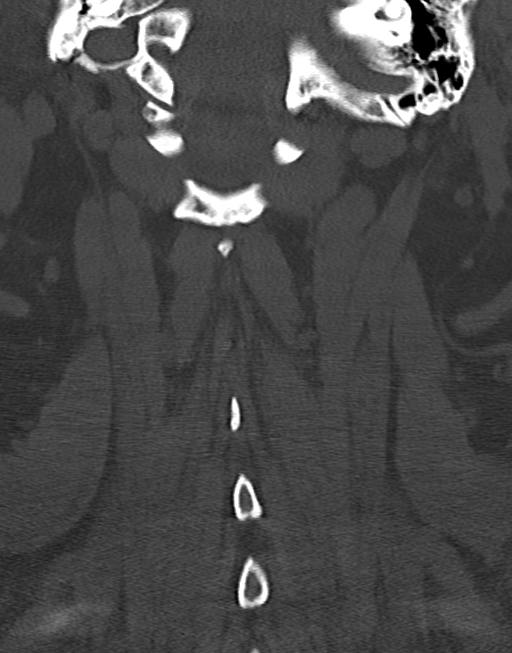

[13 of 35 positions shown; findings below may reference images not displayed]

FINDINGS: Alignment: No acute malalignment.

Skull base and vertebrae: Negative for acute fracture, erosion, or
bone lesion.

Soft tissues and spinal canal: History of swelling with no evidence
of edema or mass.

Disc levels: Diffuse idiopathic skeletal hyperostosis with bulky
ventral spurring. Osteophytes are bridging from C3-C7. there is also
posterior ridging and posterior longitudinal ligament ossification
contacting the ventral cord, most notable at C2-3. Mild-to-moderate
foraminal narrowings.

Upper chest: Negative
IMPRESSION: 1. No acute finding.
2. Diffuse idiopathic skeletal hyperostosis with ankylosis from
C3-C7.
3. Posterior longitudinal ligament ossification and endplate ridging
which contacts the cord at multiple levels, spinal stenosis greatest
at C2-3. No evident change from a 7739 cervical MRI.

## 2021-02-10 IMAGING — DX CHEST - 2 VIEW
2 series · 2 of 2 positions shown · non-contrast
Comparison: 10/30/2018

CLINICAL DATA: Bilateral lower extremity swelling

EXAM:
CHEST - 2 VIEW

[chest lat]
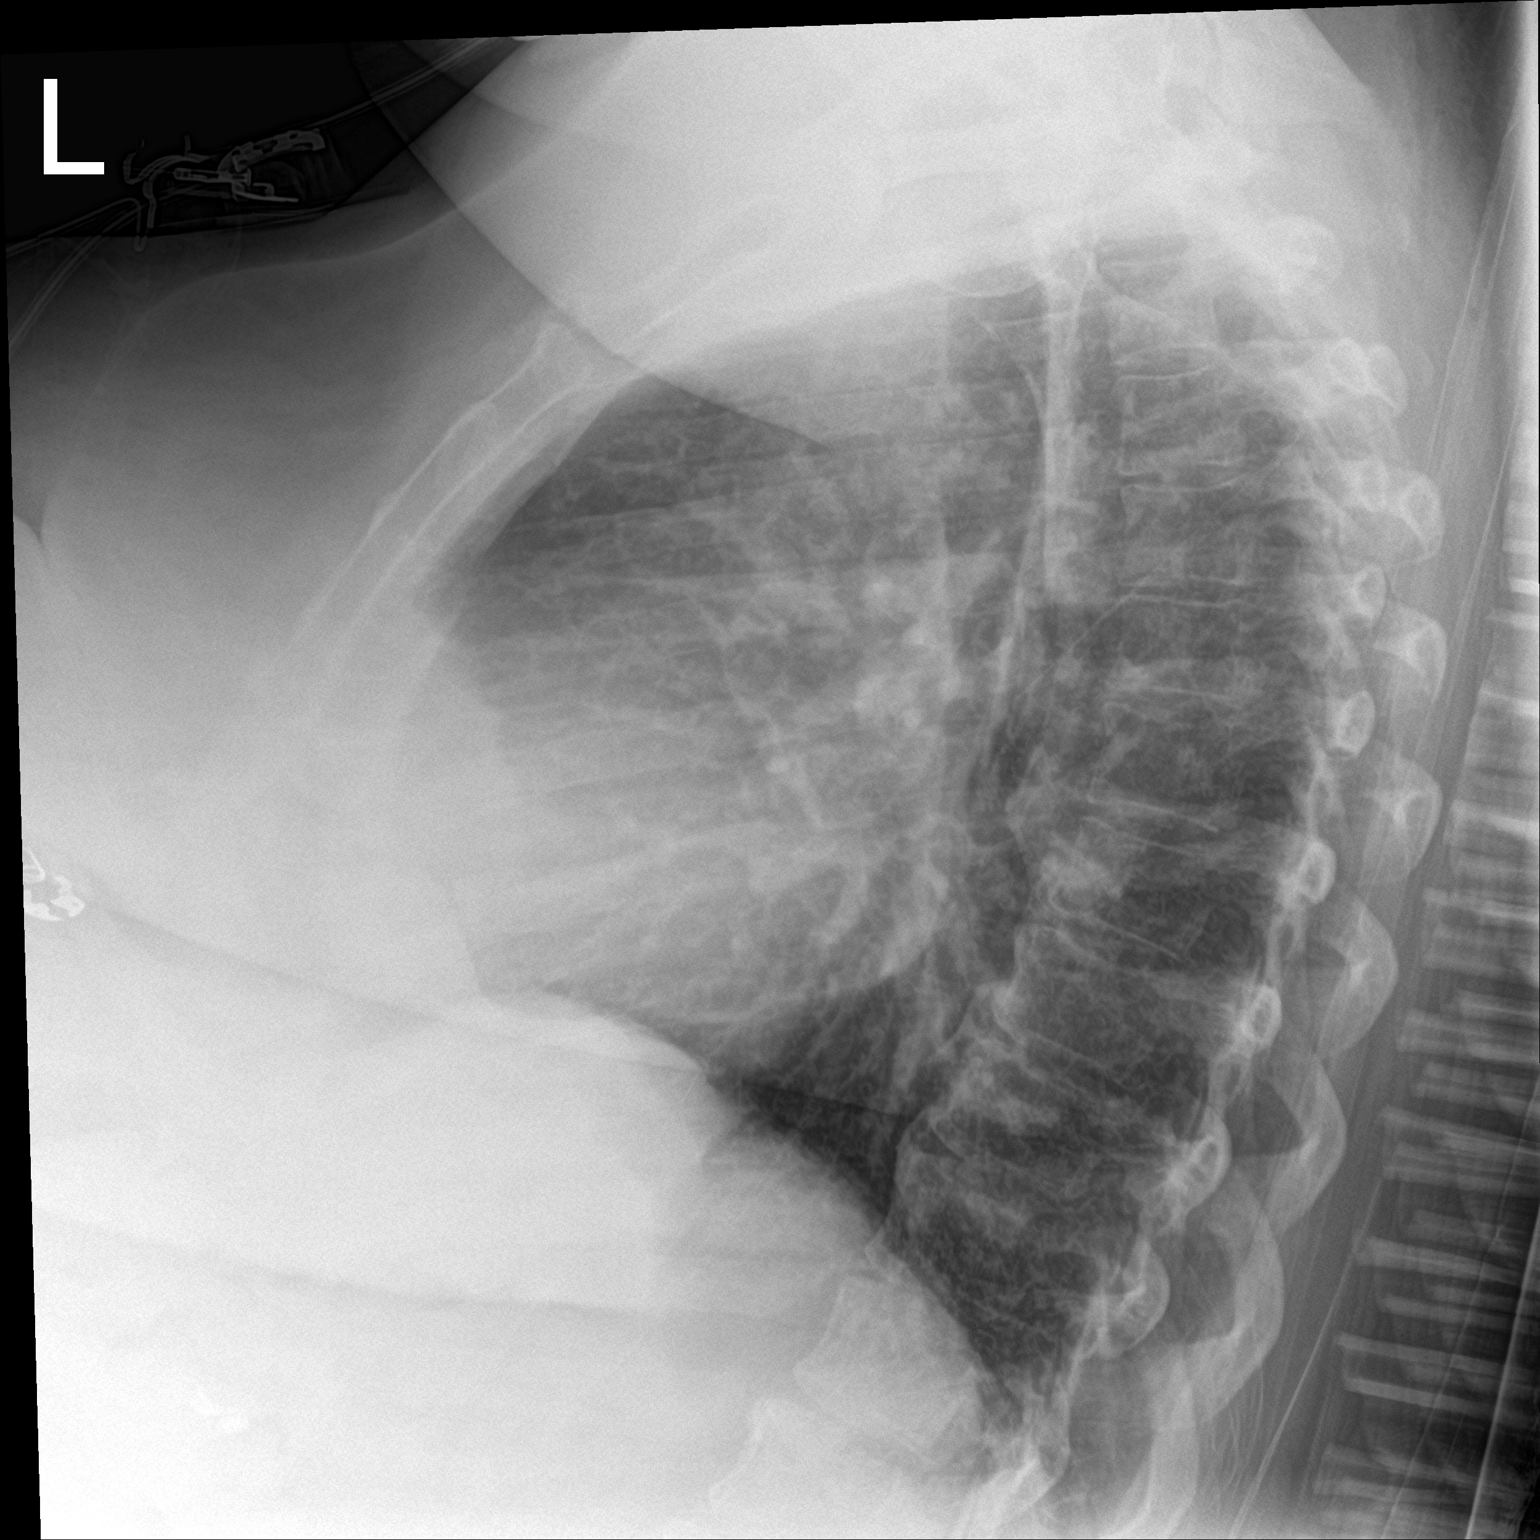

[chest ap]
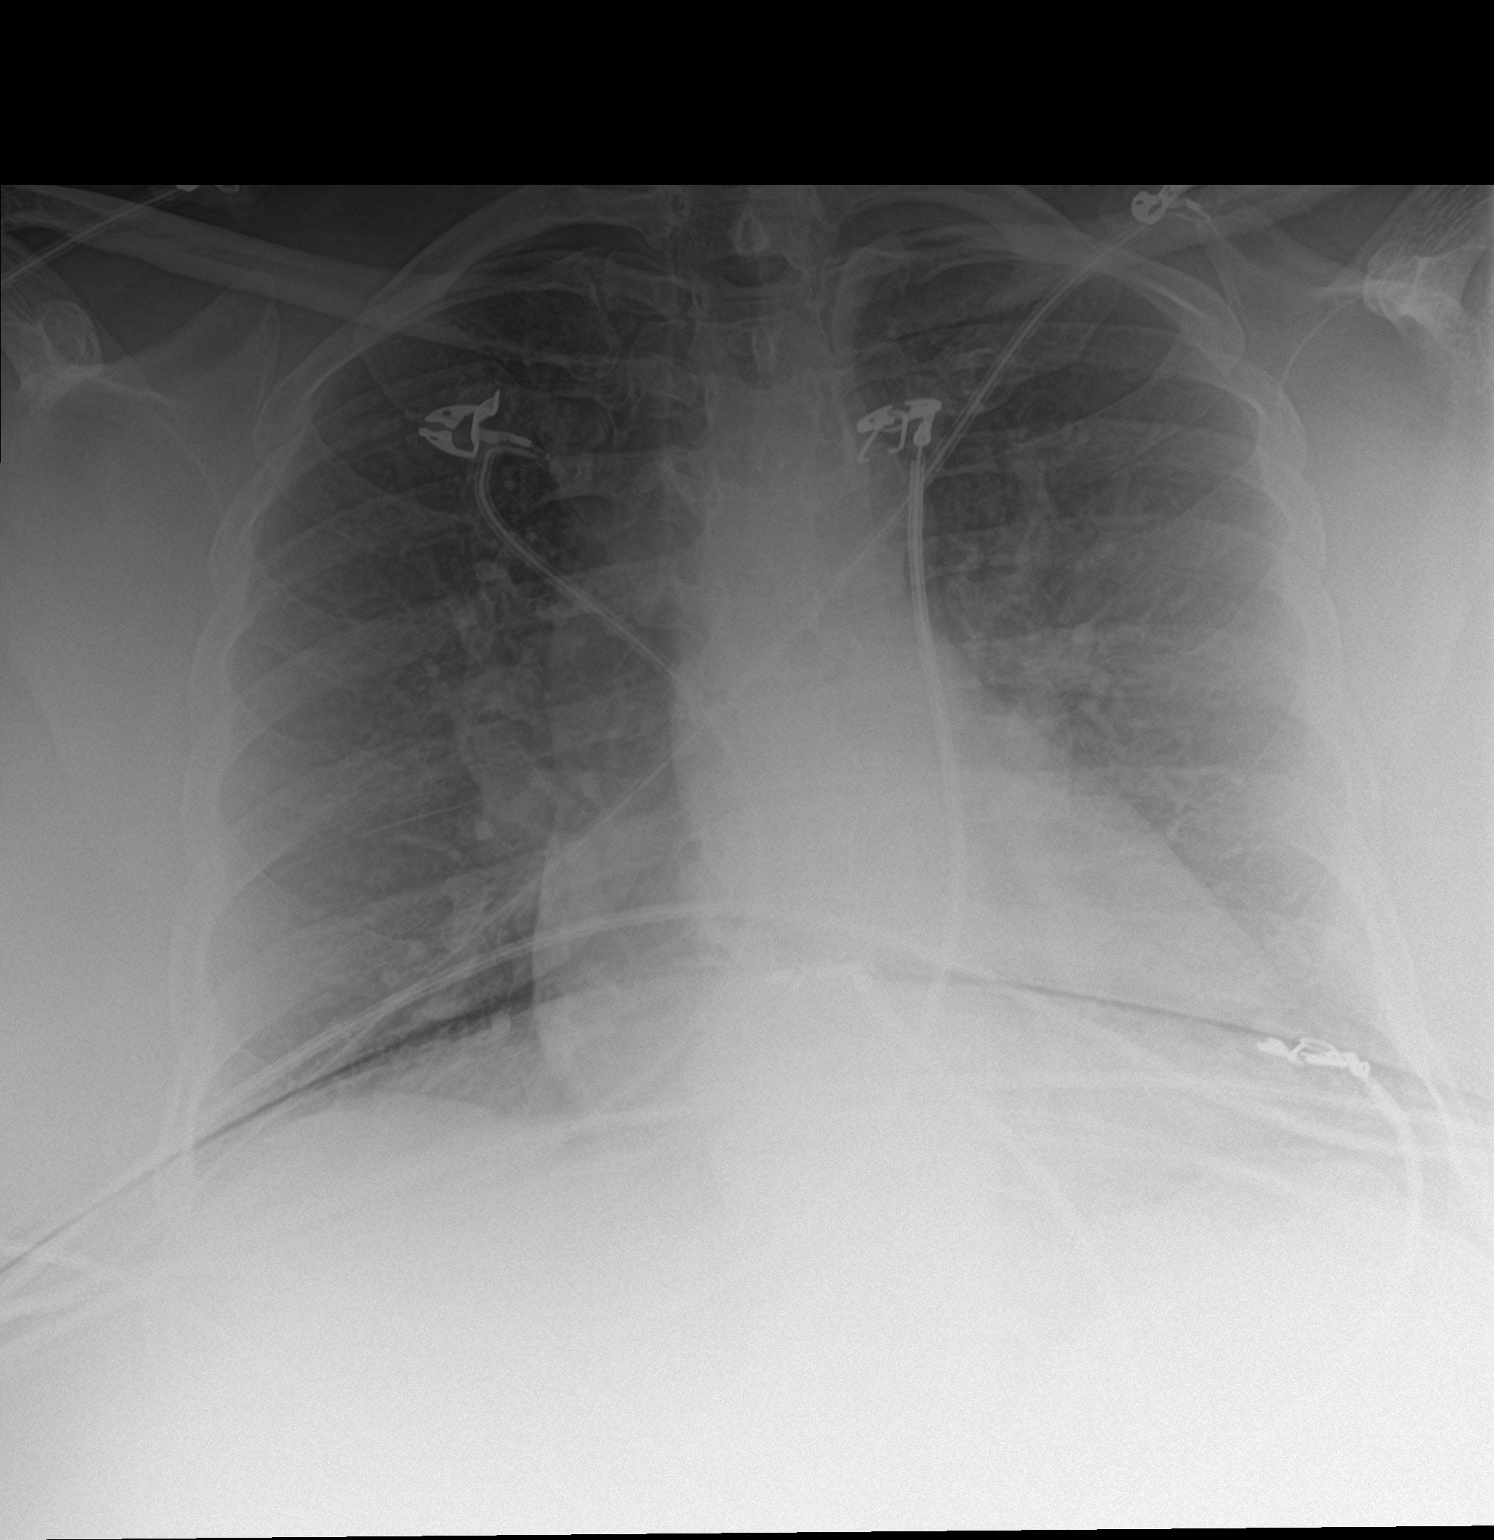

[2 of 2 positions shown; findings below may reference images not displayed]

FINDINGS: The heart size and mediastinal contours are within normal limits.
Both lungs are clear. The visualized skeletal structures are
unremarkable.
IMPRESSION: No active cardiopulmonary disease.

## 2021-02-11 ENCOUNTER — Telehealth: Payer: Self-pay | Admitting: Internal Medicine

## 2021-02-11 NOTE — Telephone Encounter (Addendum)
6 units at breakfast and sugar was 265 afterwards 6 units at lunch and sugar was 283 afterwards Will download meter so you can see readings.

## 2021-02-11 NOTE — Telephone Encounter (Signed)
Error

## 2021-02-11 NOTE — Telephone Encounter (Signed)
Pt calling in for high BS 283 hour ago. Even after insulin BS have been high for the last couple of days. Pt contact [phone]

## 2021-02-12 NOTE — Telephone Encounter (Signed)
Patient has been notified and will make medication change

## 2021-02-25 DIAGNOSIS — H6123 Impacted cerumen, bilateral: Secondary | ICD-10-CM | POA: Insufficient documentation

## 2021-02-25 DIAGNOSIS — H903 Sensorineural hearing loss, bilateral: Secondary | ICD-10-CM | POA: Insufficient documentation

## 2021-03-10 ENCOUNTER — Other Ambulatory Visit: Payer: Self-pay | Admitting: Internal Medicine

## 2021-03-10 MED ORDER — LANTUS SOLOSTAR 100 UNIT/ML ~~LOC~~ SOPN: 22.0000 [IU] | PEN_INJECTOR | Freq: Every day | SUBCUTANEOUS | 3 refills | Status: DC

## 2021-03-10 NOTE — Progress Notes (Unsigned)
Letter from insurance Amy [name] is not covered anymore, no formulary provided, will try Lantus

## 2021-03-19 ENCOUNTER — Other Ambulatory Visit: Payer: Self-pay | Admitting: Pain Medicine

## 2021-03-19 DIAGNOSIS — R200 Anesthesia of skin: Secondary | ICD-10-CM

## 2021-03-19 DIAGNOSIS — M79602 Pain in left arm: Secondary | ICD-10-CM

## 2021-03-19 DIAGNOSIS — R202 Paresthesia of skin: Secondary | ICD-10-CM

## 2021-04-02 ENCOUNTER — Ambulatory Visit: Payer: 59 | Admitting: Internal Medicine

## 2021-04-07 ENCOUNTER — Encounter: Payer: Self-pay | Admitting: Podiatry

## 2021-04-07 ENCOUNTER — Ambulatory Visit (INDEPENDENT_AMBULATORY_CARE_PROVIDER_SITE_OTHER): Payer: Medicare Other | Admitting: Podiatry

## 2021-04-07 ENCOUNTER — Other Ambulatory Visit: Payer: Self-pay

## 2021-04-07 DIAGNOSIS — R1310 Dysphagia, unspecified: Secondary | ICD-10-CM | POA: Insufficient documentation

## 2021-04-07 DIAGNOSIS — B351 Tinea unguium: Secondary | ICD-10-CM

## 2021-04-07 DIAGNOSIS — E1142 Type 2 diabetes mellitus with diabetic polyneuropathy: Secondary | ICD-10-CM | POA: Diagnosis not present

## 2021-04-07 DIAGNOSIS — L89610 Pressure ulcer of right heel, unstageable: Secondary | ICD-10-CM | POA: Diagnosis not present

## 2021-04-07 DIAGNOSIS — L84 Corns and callosities: Secondary | ICD-10-CM

## 2021-04-07 DIAGNOSIS — Z860100 Personal history of colon polyps, unspecified: Secondary | ICD-10-CM | POA: Insufficient documentation

## 2021-04-07 DIAGNOSIS — M79676 Pain in unspecified toe(s): Secondary | ICD-10-CM

## 2021-04-07 DIAGNOSIS — K219 Gastro-esophageal reflux disease without esophagitis: Secondary | ICD-10-CM | POA: Insufficient documentation

## 2021-04-07 NOTE — Progress Notes (Signed)
Subjective:  Patient ID: Amy [name], female    DOB: 07/18/1960,  MRN: [id number]  Amy [name] presents to clinic today for at risk foot care with history of diabetic neuropathy, painful elongated mycotic toenails 1-5 bilaterally which are tender when wearing enclosed shoe gear. Pain is relieved with periodic professional debridement., and preulcerative lesion(s) bilateral heels.  Patient states blood glucose was 165 mg/dl today.    She is accompanied by her provider on today's visit. She states right heel is most symptomatic today. She relates wearing heel protectors in bed, but states there are times that it comes off while she is sleeping.  PCP is Roselee Nova, MD , and last visit was November, 2022.  Allergies  Allergen Reactions   Penicillins Hives and Swelling    Has patient had a PCN reaction causing immediate rash, facial/tongue/throat swelling, SOB or lightheadedness with hypotension: yes- face swelling Has patient had a PCN reaction causing severe rash involving mucus membranes or skin necrosis: no Has patient had a PCN reaction that required hospitalization unknown (childhood allergy) Has patient had a PCN reaction occurring within the last 10 years: no If all of the above answers are "NO", then may proceed with Cephalosporin use.    Tramadol Nausea And Vomiting    Intense nausea   Vicodin [Hydrocodone-Acetaminophen] Nausea And Vomiting   Codeine Nausea Only    Other reaction(s): Unknown   Iodine-131 Hives   Iohexol Hives    Pt developed itching and hives along with nasal congestion; needs 13 hour premeds for future studies, Onset Date: [id number]    Lisinopril Cough    Other reaction(s): Unknown   Sulfa Antibiotics Nausea And Vomiting    Review of Systems: Negative except as noted in the HPI. Objective:              Constitutional Amy [name] is a pleasant 60 y.o. African American female, obese in NAD. AAO x 3.   Vascular CFT <3 seconds b/l  LE. Faintly palpable DP pulses b/l LE. Faintly palpable PT pulse(s) b/l LE. Pedal hair sparse. No pain with calf compression b/l. Lower extremity skin temperature gradient within normal limits. Trace edema noted BLE. No ischemia or gangrene noted b/l LE. No cyanosis or clubbing noted b/l LE. No cyanosis or clubbing noted.  Neurologic Normal speech. Oriented to person, place, and time. Pt has subjective symptoms of neuropathy. Protective sensation intact 5/5 intact bilaterally with 10g monofilament b/l. Vibratory sensation intact b/l.  Dermatologic No open wounds b/l LE. No interdigital macerations noted b/l LE. Toenails 1-5 b/l elongated, discolored, dystrophic, thickened, crumbly with subungual debris and tenderness to dorsal palpation. Preulcerative lesion noted posteromedial heel right lower extremity. There is visible subdermal hemorrhage. Measures 2.0 x 2.0 cm. There is no surrounding erythema, no edema, no drainage, no odor, no fluctuance.  Orthopedic: Normal muscle strength 5/5 to all lower extremity muscle groups bilaterally. No pain, crepitus or joint limitation noted with ROM b/l LE. HAV with bunion deformity noted b/l LE. Hammertoe deformity noted 2-5 b/l.Marland Kitchen Patient ambulates with wheelchair assistance.   Radiographs: None Assessment:   1. Pain due to onychomycosis of toenail   2. Callus   3. Pressure injury of right heel, unstageable ([place])   4. Diabetic peripheral neuropathy associated with type 2 diabetes mellitus ([place])    Plan:  Patient was evaluated and treated and all questions answered. Consent given for treatment as described below: -Revisited pressure precautions. She will purchase different heel protectors from [employer].  Will contact [facility] [phone]) for Wound Care Consult and assistance with managing right foot.. -Continue foot and shoe inspections daily. Monitor blood glucose per PCP/Endocrinologist's recommendations. -Toenails 1-5 b/l were debrided in length  and girth with sterile nail nippers and dremel without iatrogenic bleeding.  -Preulcerative lesion pared posteromedial heel right foot. Total number pared=1. Patient noted relief post treatment today. I will have her follow up with Dr. Blenda Mounts in two weeks for reassessment of right heel. -Will schedule her with Pedorthist in January for new diabetic shoes. -Patient/POA to call should there be question/concern in the interim. -Return in about 2 weeks (around 04/21/2021) to see Dr. Blenda Mounts for follow up of right heel. Will follow up with me in 3 months for her routine foot care.  Marzetta Board, DPM

## 2021-04-07 NOTE — Patient Instructions (Addendum)
Apply Boudreaux's Butt Paste to both heels once daily. It is usually over the counter.  You must wear your heel protectors at all times to prevent skin breakdown.  I will contact [facility] for the Nurse to monitor your heels.  I will have you follow up with Dr. Blenda Mounts in 2 weeks for your right heel.  Pressure Injury A pressure injury is damage to the skin and underlying tissue that results from pressure being applied to an area of the body. It often affects people who must spend a long time in a bed or chair because of a medical condition. Pressure injuries usually occur: Over bony parts of the body, such as the tailbone, shoulders, elbows, hips, heels, spine, ankles, and back of the head. Under medical devices that make contact with the body, such as respiratory equipment, stockings, tubes, and splints. Pressure injuries start as reddened areas on the skin and can lead to pain and an open wound. What are the causes? This condition is caused by frequent or constant pressure to an area of the body. Decreased blood flow to the skin can eventually cause the skin tissue to die and break down, causing a wound. What increases the risk? You are more likely to develop this condition if you: Are in the hospital or an extended care facility. Are bedridden or in a wheelchair. Have an injury or disease that keeps you from: Moving normally. Feeling pain or pressure. Have a condition that: Makes you sleepy or less alert. Causes poor blood flow. Need to wear a medical device. Have poor control of your bladder or bowel functions (incontinence). Have poor nutrition (malnutrition). If you are at risk for pressure injuries, your health care provider may recommend certain types of mattresses, mattress covers, pillows, cushions, or boots to help prevent them. These may include products filled with air, foam, gel, or sand. What are the signs or symptoms? Symptoms of this condition depend on the  severity of the injury. Symptoms may include: Red or dark areas of the skin. Pain, warmth, or a change of skin texture. Blisters. An open wound. How is this diagnosed? This condition is diagnosed with a medical history and physical exam. You may also have tests, such as: Blood tests. Imaging tests. Blood flow tests. Your pressure injury will be staged based on its severity. Staging is based on: The depth of the tissue injury, including whether there is exposure of muscle, bone, or tendon. The cause of the pressure injury. How is this treated? This condition may be treated by: Relieving or redistributing pressure on your skin. This includes: Frequently changing your position. Avoiding positions that caused the wound or that can make the wound worse. Using specific bed mattresses, chair cushions, or protective boots. Moving medical devices from an area of pressure, or placing padding between the skin and the device. Using foams, creams, or powders to prevent rubbing (friction) on the skin. Keeping your skin clean and dry. This may include using a skin cleanser or skin barrier as told by your health care provider. Cleaning your injury and removing any dead tissue from the wound (debridement). Placing a bandage (dressing) over your injury. Using medicines for pain or to prevent or treat infection. Surgery may be needed if other treatments are not working or if your injury is very deep. Follow these instructions at home: Wound care Follow instructions from your health care provider about how to take care of your wound. Make sure you: Wash your hands with soap  and water before and after you change your bandage (dressing). If soap and water are not available, use hand sanitizer. Change your dressing as told by your health care provider.  Check your wound every day for signs of infection. Have a caregiver do this for you if you are not able. Check for: Redness, swelling, or increased  pain. More fluid or blood. Warmth. Pus or a bad smell. Skin care Keep your skin clean and dry. Gently pat your skin dry. Do not rub or massage your skin. You or a caregiver should check your skin every day for any changes in color or any new blisters or sores (ulcers). Medicines Take over-the-counter and prescription medicines only as told by your health care provider. If you were prescribed an antibiotic medicine, take or apply it as told by your health care provider. Do not stop using the antibiotic even if your condition improves. Reducing and redistributing pressure Do not lie or sit in one position for a long time. Move or change position every 1-2 hours, or as told by your health care provider. Use pillows or cushions to reduce pressure. Ask your health care provider to recommend cushions or pads for you. General instructions  Eat a healthy diet that includes lots of protein. Drink enough fluid to keep your urine pale yellow. Be as active as you can every day. Ask your health care provider to suggest safe exercises or activities. Do not abuse drugs or alcohol. Do not use any products that contain nicotine or tobacco, such as cigarettes, e-cigarettes, and chewing tobacco. If you need help quitting, ask your health care provider. Keep all follow-up visits as told by your health care provider. This is important. Contact a health care provider if: You have: A fever or chills. Pain that is not helped by medicine. Any changes in skin color. New blisters or sores. Pus or a bad smell coming from your wound. Redness, swelling, or pain around your wound. More fluid or blood coming from your wound. Your wound does not improve after 1-2 weeks of treatment. Summary A pressure injury is damage to the skin and underlying tissue that results from pressure being applied to an area of the body. Do not lie or sit in one position for a long time. Your health care provider may advise you to move  or change position every 1-2 hours. Follow instructions from your health care provider about how to take care of your wound. Keep all follow-up visits as told by your health care provider. This is important. This information is not intended to replace advice given to you by your health care provider. Make sure you discuss any questions you have with your health care provider. Document Revised: 11/29/2017 Document Reviewed: 11/29/2017 Elsevier Patient Education  [place].

## 2021-04-13 ENCOUNTER — Telehealth: Payer: Self-pay | Admitting: Podiatry

## 2021-04-13 NOTE — Telephone Encounter (Signed)
Patient states that you asked her to get heel protectors and they ordered them, they will not stay on her feet, is there another brand or is there something else you would suggest her to use. Patient said she has tiny feet and they will not stay in her shoes.

## 2021-04-15 ENCOUNTER — Other Ambulatory Visit: Payer: Medicare Other

## 2021-04-16 ENCOUNTER — Other Ambulatory Visit: Payer: Self-pay | Admitting: Family Medicine

## 2021-04-16 DIAGNOSIS — Z1231 Encounter for screening mammogram for malignant neoplasm of breast: Secondary | ICD-10-CM

## 2021-04-19 ENCOUNTER — Telehealth: Payer: Self-pay | Admitting: Internal Medicine

## 2021-04-19 NOTE — Telephone Encounter (Signed)
PT called concerning the Elenor Legato not working correctly. PT BS last week 200>. This AM BS was 149. Needs assistance with Elenor Legato. Please call [phone].

## 2021-04-19 NOTE — Telephone Encounter (Signed)
Patient will contact [place] customer service to have the send her meter as she feels it is not working properly. Patient will keep her sensors on for now until she receive new one.

## 2021-04-21 ENCOUNTER — Ambulatory Visit: Payer: Medicare Other | Admitting: Podiatry

## 2021-04-21 NOTE — Telephone Encounter (Signed)
Returned call to patient giving recommendations per Dr Elisha Ponder, verbalized understanding, said ok.

## 2021-04-26 ENCOUNTER — Ambulatory Visit: Payer: 59 | Admitting: Family Medicine

## 2021-04-27 ENCOUNTER — Ambulatory Visit: Payer: Medicare Other | Admitting: Podiatry

## 2021-04-28 ENCOUNTER — Ambulatory Visit (INDEPENDENT_AMBULATORY_CARE_PROVIDER_SITE_OTHER): Payer: Medicare Other | Admitting: Podiatry

## 2021-04-28 ENCOUNTER — Other Ambulatory Visit: Payer: Self-pay

## 2021-04-28 ENCOUNTER — Encounter: Payer: Self-pay | Admitting: Podiatry

## 2021-04-28 DIAGNOSIS — Z794 Long term (current) use of insulin: Secondary | ICD-10-CM | POA: Diagnosis not present

## 2021-04-28 DIAGNOSIS — L84 Corns and callosities: Secondary | ICD-10-CM

## 2021-04-28 DIAGNOSIS — E1140 Type 2 diabetes mellitus with diabetic neuropathy, unspecified: Secondary | ICD-10-CM

## 2021-04-28 NOTE — Progress Notes (Signed)
°  Subjective:  Patient ID: Amy [name], female    DOB: 11/30/1960,   MRN: [id number]  Chief Complaint  Patient presents with   Wound Check    F/U Rt heel check -per aide ulcer looks the same, no improvemtn.": - no redness/swelling/draianage/opening Tx: diaper rash dialy     60 y.o. female presents for follow-up of right heel ulcer. She is here today with her aide and states it is doing about the same. Nothing open and no drainage. No redness or swelling. Has been applying the cream. Was unable to use the heel protectors because they were falling off while sleeping  . Denies any other pedal complaints. Denies n/v/f/c.   Past Medical History:  Diagnosis Date   Arthritis    Asthma    Blind    Blindness and low vision    left eye glass eye,  legally blind in right eye   Diabetes mellitus    Diabetic neuropathy ([place])    Hyperlipidemia    Hypertension    Hypothyroidism    Spinal stenosis     Objective:  Physical Exam: Vascular: DP/PT pulses 2/4 bilateral. CFT <3 seconds. Normal hair growth on digits. No edema.  Skin. No lacerations or abrasions bilateral feet. Hyperkeratotic lesion noted to medial right heel. No open lesion. Area is pre-ulcerative. Musculoskeletal: MMT 5/5 bilateral lower extremities in DF, PF, Inversion and Eversion. Deceased ROM in DF of ankle joint.  Neurological: Sensation intact to light touch.   Assessment:   1. Pre-ulcerative calluses   2. Type 2 diabetes mellitus with diabetic neuropathy, with long-term current use of insulin ([name])      Plan:  Patient was evaluated and treated and all questions answered.  Revisited pressure precautions. She will purchase different heel protectors from [employer].  -Continue foot and shoe inspections daily. Monitor blood glucose per PCP/Endocrinologist's recommendations. -Preulcerative lesion pared posteromedial heel right foot. Total number pared=1. Patient noted relief post treatment today.  -Patient/POA to call should  there be question/concern in the interim. -Return in about 3 months with Dr. Elisha Ponder for routine foot care.  [name], DPM

## 2021-04-29 ENCOUNTER — Ambulatory Visit
Admission: RE | Admit: 2021-04-29 | Discharge: 2021-04-29 | Disposition: A | Discharge status: Home / Self Care | Payer: Medicare Other | Source: Ambulatory Visit | Attending: Pain Medicine | Admitting: Pain Medicine

## 2021-04-29 DIAGNOSIS — R202 Paresthesia of skin: Secondary | ICD-10-CM

## 2021-04-29 DIAGNOSIS — M79602 Pain in left arm: Secondary | ICD-10-CM

## 2021-04-29 DIAGNOSIS — R200 Anesthesia of skin: Secondary | ICD-10-CM

## 2021-05-05 ENCOUNTER — Encounter: Payer: Self-pay | Admitting: Internal Medicine

## 2021-05-06 ENCOUNTER — Other Ambulatory Visit: Payer: Self-pay

## 2021-05-06 ENCOUNTER — Ambulatory Visit (INDEPENDENT_AMBULATORY_CARE_PROVIDER_SITE_OTHER): Payer: Medicare Other | Admitting: Internal Medicine

## 2021-05-06 ENCOUNTER — Encounter: Payer: Self-pay | Admitting: Internal Medicine

## 2021-05-06 VITALS — BP 134/76 | HR 74 | Ht 59.0 in | Wt 191.0 lb

## 2021-05-06 DIAGNOSIS — Z794 Long term (current) use of insulin: Secondary | ICD-10-CM | POA: Diagnosis not present

## 2021-05-06 DIAGNOSIS — E1165 Type 2 diabetes mellitus with hyperglycemia: Secondary | ICD-10-CM

## 2021-05-06 LAB — POCT GLYCOSYLATED HEMOGLOBIN (HGB A1C)

## 2021-05-06 MED ORDER — TRULICITY 1.5 MG/0.5ML ~~LOC~~ SOAJ: 1.5000 mg | SUBCUTANEOUS | 2 refills | Status: DC

## 2021-05-06 NOTE — Patient Instructions (Addendum)
-   Continue Tresiba to 14 units daily - Continue Trulicity 1.5  mg weekly  - Change  Humalog to 6 units with Breakfast, Lunch and 8 units with Supper      HOW TO TREAT LOW BLOOD SUGARS (Blood sugar LESS THAN 70 MG/DL) Please follow the RULE OF 15 for the treatment of hypoglycemia treatment (when your (blood sugars are less than 70 mg/dL)   STEP 1: Take 15 grams of carbohydrates when your blood sugar is low, which includes:  3-4 GLUCOSE TABS  OR 3-4 OZ OF JUICE OR REGULAR SODA OR ONE TUBE OF GLUCOSE GEL    STEP 2: RECHECK blood sugar in 15 MINUTES STEP 3: If your blood sugar is still low at the 15 minute recheck --> then, go back to STEP 1 and treat AGAIN with another 15 grams of carbohydrates.     HOW TO TREAT LOW BLOOD SUGARS (Blood sugar LESS THAN 70 MG/DL) Please follow the RULE OF 15 for the treatment of hypoglycemia treatment (when your (blood sugars are less than 70 mg/dL)   STEP 1: Take 15 grams of carbohydrates when your blood sugar is low, which includes:  3-4 GLUCOSE TABS  OR 3-4 OZ OF JUICE OR REGULAR SODA OR ONE TUBE OF GLUCOSE GEL    STEP 2: RECHECK blood sugar in 15 MINUTES STEP 3: If your blood sugar is still low at the 15 minute recheck --> then, go back to STEP 1 and treat AGAIN with another 15 grams of carbohydrates

## 2021-05-06 NOTE — Progress Notes (Signed)
Name: Amy [name]  Age/ Sex: 60 y.o., female   MRN/ DOB: [id number], [birth year]     PCP: Roselee Nova, MD   Reason for Endocrinology Evaluation: Type 2 Diabetes Mellitus  Initial Endocrine Consultative Visit: 06/29/2020    PATIENT IDENTIFIER: Amy [name] is a 60 y.o. female with a past medical history of T2DM, Hypothyroidism and Dyslipidemia. The patient has followed with Endocrinology clinic since 06/29/2020 for consultative assistance with management of her diabetes.  DIABETIC HISTORY:  Amy [name] was diagnosed with T2DM in her 44's. Her hemoglobin A1c has ranged from 9.9% in 2021, peaking at 12.3% in 2014.   SUBJECTIVE:   Today (05/06/2021): Amy [name] is here for a follow up on diabetes management. She is accompanied by her assistant. She checks her blood sugars multiple times a day through CGM.   The patient has  had hypoglycemic episodes since the last clinic visit.   She has been eating more starch due to food insecurities  Denies nausea or diarrhea or Vomiting   Pt follows with Dr. Osborne Casco - nephrology is started on new medicine but she is unaware of the name of the medicine.  Per patient she had labs done through nephrology, these records are not available at this time  [place]:  Tresiba 14 units daily Humalog 6 units TIDQAC- has been taking 8 units  Trulicity 1.5 mg weekly ( Friday )     Statin: yes ACE-I/ARB: no     CONTINUOUS GLUCOSE MONITORING RECORD INTERPRETATION    Dates of Recording: 12/9-12/22/2022  Sensor description: freestyle libre   Results statistics:   CGM use % of time 79  Average and SD 148/32.6  Time in range   74 %  % Time Above 180 22  % Time above 250 3  % Time Below target 1     Glycemic patterns summary: BG's optimal during the day but high at supper   Hyperglycemic episodes  post supper  Hypoglycemic episodes occurred postprandial  Overnight periods: stable        DIABETIC  COMPLICATIONS: Microvascular complications:   Denies: CKD III, neuropathy.  Left prosthetic eye. Blind right eye  Last Eye Exam: Completed 04/2020 ( Dr. 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 Her smoking use included cigarettes. She has never used smokeless tobacco. She reports that she does not drink alcohol and does not use drugs. Family History:  Family History  Problem Relation Age of Onset   Diabetes Sister    Glaucoma Sister    Hypertension Sister    Healthy Mother    Healthy Father    Stroke Brother  Heart attack Paternal Aunt    Breast cancer Neg Hx      HOME MEDICATIONS: Allergies as of 05/06/2021       Reactions   Penicillins Hives, Swelling   Has patient had a PCN reaction causing immediate rash, facial/tongue/throat swelling, SOB or lightheadedness with hypotension: yes- face swelling Has patient had a PCN reaction causing severe rash involving mucus membranes or skin necrosis: no Has patient had a PCN reaction that required hospitalization unknown (childhood allergy) Has patient had a PCN reaction occurring within the last 10 years: no If all of the above answers are "NO", then may proceed with Cephalosporin use.   Tramadol Nausea And Vomiting   Intense nausea   Vicodin [hydrocodone-acetaminophen]  Nausea And Vomiting   Codeine Nausea Only   Other reaction(s): Unknown   Iodine-131 Hives   Iohexol Hives   Pt developed itching and hives along with nasal congestion; needs 13 hour premeds for future studies, Onset Date: [id number]   Lisinopril Cough   Other reaction(s): Unknown   Sulfa Antibiotics Nausea And Vomiting        Medication List        Accurate as of May 06, 2021  9:49 AM. If you have any questions, ask your nurse or doctor.          STOP taking these medications    clonazePAM 0.5 MG tablet Commonly known as: KLONOPIN Stopped by: Dorita Sciara, MD   cyclobenzaprine 5 MG tablet Commonly known as: FLEXERIL Stopped by: Dorita Sciara, MD   Lantus SoloStar 100 UNIT/ML Solostar Pen Generic drug: insulin glargine Stopped by: Dorita Sciara, MD   predniSONE 5 MG (21) Tbpk tablet Commonly known as: STERAPRED UNI-PAK 21 TAB Stopped by: Dorita Sciara, MD       TAKE these medications    albuterol 108 (90 Base) MCG/ACT inhaler Commonly known as: VENTOLIN HFA Inhale 2 puffs into the lungs every 4 (four) hours as needed for wheezing or shortness of breath. For short   allopurinol 300 MG tablet Commonly known as: ZYLOPRIM Take 300 mg by mouth 2 (two) times daily.   aspirin EC 81 MG tablet Take 81 mg by mouth daily.   blood glucose meter kit and supplies Dispense based on patient and insurance preference. Use up to four times daily as directed. (FOR ICD-10 E10.9, E11.9).   cetirizine 10 MG tablet Commonly known as: ZYRTEC Take 10 mg by mouth daily.   colchicine 0.6 MG tablet Take by mouth.   Embrace Talk Glucose Test test strip Generic drug: glucose blood USE TO CHECK BLOOD SUGARS FOUR TIMES A DAY BEFORE MEALS AND AT BEDTIME DX E11.29   esomeprazole 40 MG capsule Commonly known as: NEXIUM Take 1 capsule (40 mg total) by mouth daily.   FreeStyle [place] 2 Reader [place] As instructed. iddm with complications -  retinopathy, neuropathy, nephropathy. With device and sensors.   gabapentin 300 MG capsule Commonly known as: NEURONTIN TAKE 1 CAPSULE (300 MG TOTAL) BY MOUTH 3 (THREE) TIMES DAILY. What changed: how much to take   HumaLOG KwikPen 100 UNIT/ML KwikPen Generic drug: insulin lispro Max Daily 30 units   hydrOXYzine 25 MG tablet Commonly known as: ATARAX Take 0.5 tablets (12.5 mg total) by mouth every 8 (eight) hours as needed for itching.   levothyroxine 150 MCG tablet Commonly known as: SYNTHROID Take 1 tablet (150 mcg total) by mouth daily. Needs appt for future refills   linaclotide 145 MCG Caps capsule Commonly known  as: LINZESS Take 145 mcg by mouth daily as needed (constipation).   naproxen 500 MG tablet Commonly known as: NAPROSYN Take 500 mg by mouth 2 (two) times daily with a meal.   olmesartan 20 MG tablet Commonly known as: BENICAR Take 10 mg by mouth daily.   ondansetron 4 MG disintegrating tablet Commonly known as: Zofran ODT Take 1 tablet (4 mg total) by mouth every 8 (eight) hours as needed for nausea or vomiting.   Pen Needles 31G X 8 MM Misc Inject 1 Device into the skin in the morning, at noon, in the evening, and at bedtime.   potassium chloride SA 20 MEQ tablet Commonly known as: KLOR-CON M Take 1 tablet (20 mEq total) by mouth daily. Need appt for future refills   Prodigy Twist Top Lancets 28G Misc USE TO CHECK BLOOD SUGARS   simvastatin 20 MG tablet Commonly known as: ZOCOR Take 1 tablet (20 mg total) by mouth every evening.   spironolactone 25 MG tablet Commonly known as: ALDACTONE Take 0.5 tablets (12.5 mg total) by mouth daily. Needs appt for future refills   tiZANidine 2 MG tablet Commonly known as: ZANAFLEX Take 2 mg by mouth 2 (two) times daily.   torsemide 20 MG tablet Commonly known as: DEMADEX Take 4 tablets (80 mg total) by mouth daily. Need appt for future refills   Tresiba FlexTouch 200 UNIT/ML FlexTouch Pen Generic drug:  insulin degludec 14 Units.   Trulicity 1.5 JQ/7.3AL Sopn Generic drug: Dulaglutide Inject 1.5 mg into the skin once a week.         OBJECTIVE:   Vital Signs: BP 134/76 (BP Location: Left Arm, Patient Position: Sitting, Cuff Size: Small)    Pulse 74    Ht 4' 11" (1.499 m)    Wt 191 lb (86.6 kg)    SpO2 99%    BMI 38.58 kg/m   Wt Readings from Last 3 Encounters:  05/06/21 191 lb (86.6 kg)  12/30/20 196 lb 6.4 oz (89.1 kg)  09/18/20 212 lb 6 oz (96.3 kg)     Exam: General: Pt appears well and is in NAD  Lungs: Clear with good BS bilat with no rales, rhonchi, or wheezes  Heart: RRR with normal S1 and S2 and no gallops; no murmurs; no rub  Extremities: Race  pretibial edema.   Neuro: MS is good with appropriate affect, pt is alert and Ox3       DM Foot Exam 05/06/2021   The skin of the feet shows pre-ulcerative callus at the right heel  The pedal pulses are 2+ on right and 2+ on left. The sensation is intact to a screening 5.07, 10 gram monofilament bilaterally     DATA REVIEWED:  Lab Results  Component Value Date   HGBA1C 9.7 (A) 12/30/2020   HGBA1C 7.6 (A) 09/18/2020   HGBA1C 7.6 (A) 06/29/2020   Lab Results  Component Value Date   MICROALBUR 9.74 (H) 10/30/2012   LDLCALC 66 04/13/2020   CREATININE 1.49 (H) 06/08/2020   Results for SHACORIA, LATIF (MRN [id number]) as of 12/30/2020 09:40  Ref. Range 06/08/2020 15:43  Sodium Latest Ref Range: 134 - 144 mmol/L 145 (H)  Potassium Latest Ref Range: 3.5 - 5.2 mmol/L 4.6  Chloride Latest Ref Range: 96 - 106 mmol/L 107 (H)  CO2 Latest Ref Range: 20 - 29 mmol/L 26  Glucose Latest Ref Range: 65 - 99 mg/dL 63 (L)  BUN Latest Ref Range: 8 - 27 mg/dL 44 (H)  Creatinine Latest Ref Range: 0.57 - 1.00 mg/dL 1.49 (H)  Calcium Latest Ref Range: 8.7 - 10.3 mg/dL 9.9  BUN/Creatinine Ratio Latest Ref Range: 12 - 28  30 (H)  GFR, Est Non African American Latest Ref Range: >59 mL/min/1.73 38 (L)  GFR, Est African American  Latest Ref Range: >59 mL/min/1.73 43 (L)       ASSESSMENT / PLAN / RECOMMENDATIONS:   1) Type 2 Diabetes Mellitus, Poorly controlled, With neuropathic, retinopathic and CKD III complications - Most recent A1c of 9.7 %. Goal A1c < 7.0 %.    - Her A1c remains above goal but stable  - In review of her CGM, 74 % of her glucose data are at goal , which is up from 38 % from August which is interesting to me that her A1c has not improved.  - I am concerned about imperfect adherence but she admits that due to food insecurities she has to eat what's available and at times these are high starchy meals  - She continues with hypoglycemia post bolus, she has not reduced her prandial insulin as previously advised, we will reduce her prandial dose for breakfast and lunch but continue the same dose for supper. - I will not provider her with a correction scale, she doesn't use it  - Main barrier to diabetes self care if visual impairement  MEDICATIONS: -Continue Tresiba 14 units daily - Continue Trulicity 1.5 mg weekly  - Change  Humalog to 6 units with breakfast, 6 units with lunch, and 8 units with supper   EDUCATION / INSTRUCTIONS: BG monitoring instructions: Patient is instructed to check her blood sugars 3 times a day, before meals. Call [place] [facility] clinic if: BG persistently < 70  I reviewed the Rule of 15 for the treatment of hypoglycemia in detail with the patient. Literature supplied.    2) Diabetic complications:  Eye: Does  have known diabetic retinopathy.  Neuro/ Feet: Does  have known diabetic peripheral neuropathy .  Renal: Patient does  have known baseline CKD. She   is not on an ACEI/ARB at present.      F/U in 6 months   Signed electronically by: Mack Guise, MD  [facility] Endocrinology  [address] [place], [place] [address] Phone: [phone] FAX: [phone]   CC: Roselee Nova, [place] [address] Phone: [phone]  Fax: [phone]  Return to Endocrinology clinic as below: Future Appointments  Date Time Provider [place]  05/21/2021 10:10 AM [facility] MM 3 [facility]  06/10/2021 10:00 AM Little, Olevia Bowens [facility]  07/20/2021 10:30 AM Marzetta Board, DPM [facility]  08/09/2021 10:00 AM Debbora Presto, NP [facility] None  11/04/2021 10:30 AM Maxxwell Edgett, Melanie Crazier, MD [facility] None

## 2021-05-18 ENCOUNTER — Telehealth: Payer: Self-pay | Admitting: Internal Medicine

## 2021-05-18 NOTE — Telephone Encounter (Signed)
Patient sister picked up Dexcom, One Touch Verio and 3 boxes of test strips.

## 2021-05-21 ENCOUNTER — Ambulatory Visit
Admission: RE | Admit: 2021-05-21 | Discharge: 2021-05-21 | Disposition: A | Discharge status: Home / Self Care | Payer: Commercial Managed Care - HMO | Source: Ambulatory Visit | Attending: Family Medicine | Admitting: Family Medicine

## 2021-05-21 ENCOUNTER — Other Ambulatory Visit: Payer: Self-pay

## 2021-05-21 DIAGNOSIS — Z1231 Encounter for screening mammogram for malignant neoplasm of breast: Secondary | ICD-10-CM

## 2021-05-26 ENCOUNTER — Other Ambulatory Visit: Payer: Self-pay | Admitting: *Deleted

## 2021-05-26 ENCOUNTER — Other Ambulatory Visit: Payer: Self-pay | Admitting: Family Medicine

## 2021-05-26 DIAGNOSIS — R928 Other abnormal and inconclusive findings on diagnostic imaging of breast: Secondary | ICD-10-CM

## 2021-05-27 ENCOUNTER — Ambulatory Visit (INDEPENDENT_AMBULATORY_CARE_PROVIDER_SITE_OTHER): Payer: Commercial Managed Care - HMO | Admitting: Internal Medicine

## 2021-05-27 ENCOUNTER — Other Ambulatory Visit: Payer: Self-pay

## 2021-05-27 ENCOUNTER — Encounter: Payer: Self-pay | Admitting: Internal Medicine

## 2021-05-27 VITALS — BP 146/90 | HR 70 | Ht 59.0 in | Wt 189.0 lb

## 2021-05-27 DIAGNOSIS — Z794 Long term (current) use of insulin: Secondary | ICD-10-CM

## 2021-05-27 DIAGNOSIS — E1142 Type 2 diabetes mellitus with diabetic polyneuropathy: Secondary | ICD-10-CM

## 2021-05-27 DIAGNOSIS — E1165 Type 2 diabetes mellitus with hyperglycemia: Secondary | ICD-10-CM | POA: Diagnosis not present

## 2021-05-27 DIAGNOSIS — E11319 Type 2 diabetes mellitus with unspecified diabetic retinopathy without macular edema: Secondary | ICD-10-CM

## 2021-05-27 NOTE — Patient Instructions (Addendum)
-   Continue Tresiba to 14 units daily - Continue Trulicity 1.5  mg weekly  - Continue  Humalog  6 units with Breakfast, 6 units with Lunch and 8 units with Supper      HOW TO TREAT LOW BLOOD SUGARS (Blood sugar LESS THAN 70 MG/DL) Please follow the RULE OF 15 for the treatment of hypoglycemia treatment (when your (blood sugars are less than 70 mg/dL)   STEP 1: Take 15 grams of carbohydrates when your blood sugar is low, which includes:  3-4 GLUCOSE TABS  OR 3-4 OZ OF JUICE OR REGULAR SODA OR ONE TUBE OF GLUCOSE GEL    STEP 2: RECHECK blood sugar in 15 MINUTES STEP 3: If your blood sugar is still low at the 15 minute recheck --> then, go back to STEP 1 and treat AGAIN with another 15 grams of carbohydrates

## 2021-05-27 NOTE — Progress Notes (Signed)
Name: Amy [name]  Age/ Sex: 61 y.o., female   MRN/ DOB: [id number], 03/25/1961     PCP: Roselee Nova, MD   Reason for Endocrinology Evaluation: Type 2 Diabetes Mellitus  Initial Endocrine Consultative Visit: 06/29/2020    PATIENT IDENTIFIER: [name] is a 61 y.o. female with a past medical history of T2DM, Hypothyroidism and Dyslipidemia. The patient has followed with Endocrinology clinic since 06/29/2020 for consultative assistance with management of her diabetes.  DIABETIC HISTORY:  Amy [name] was diagnosed with T2DM in her 65's. Her hemoglobin A1c has ranged from 9.9% in 2021, peaking at 12.3% in 2014.   SUBJECTIVE:      Today (05/27/2021): Amy [name] is here for a follow up on diabetes management. She is accompanied by her assistant. She checks her blood sugars multiple times a day through CGM but  she had issues with freestyle libre  for the 2 weeks .    Denies nausea or diarrhea or Vomiting    Pt follows with Dr. Osborne Casco - nephrology is started on new medicine but she is unaware of the name of the medicine.  Per patient she had labs done through nephrology, these records are not available at this time  [place]:  Tresiba 14 units daily Humalog 9/8/9 units  Trulicity 1.5 mg weekly ( Friday )     Statin: yes ACE-I/ARB: no     CONTINUOUS GLUCOSE MONITORING RECORD INTERPRETATION  : has not been using    Glucose log   05/27/2021   237      DIABETIC COMPLICATIONS: Microvascular complications:   Denies: CKD III, neuropathy.  Left prosthetic eye. Blind right eye  Last Eye Exam: Completed 04/2020 ( Dr. 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 Her smoking use included cigarettes. She has never used smokeless tobacco. She reports that she does not drink alcohol and does not use drugs. Family History:  Family History  Problem Relation Age of Onset   Diabetes Sister    Glaucoma Sister    Hypertension Sister    Healthy Mother    Healthy Father    Stroke Brother    Heart attack Paternal Aunt    Breast cancer Neg Hx      HOME MEDICATIONS: Allergies as of 05/27/2021       Reactions   Penicillins Hives, Swelling   Has patient had a PCN reaction causing immediate rash, facial/tongue/throat swelling, SOB or lightheadedness with hypotension: yes- face swelling Has patient had a PCN reaction causing severe rash involving mucus membranes or skin necrosis: no Has patient had a PCN reaction that required hospitalization unknown (childhood allergy) Has patient  had a PCN reaction occurring within the last 10 years: no If all of the above answers are "NO", then may proceed with Cephalosporin use.   Tramadol Nausea And Vomiting   Intense nausea   Vicodin [hydrocodone-acetaminophen] Nausea And Vomiting   Codeine Nausea Only   Other reaction(s): Unknown   Iodine-131 Hives   Iohexol Hives   Pt developed itching and hives along with nasal congestion; needs 13 hour premeds for future studies, Onset Date: [id number]   Lisinopril Cough   Other reaction(s): Unknown   Sulfa Antibiotics Nausea And Vomiting        Medication List        Accurate as of May 27, 2021  9:20 AM. If you have any questions, ask your nurse or doctor.          albuterol 108 (90 Base)  MCG/ACT inhaler Commonly known as: VENTOLIN HFA Inhale 2 puffs into the lungs every 4 (four) hours as needed for wheezing or shortness of breath. For short   allopurinol 300 MG tablet Commonly known as: ZYLOPRIM Take 300 mg by mouth 2 (two) times daily.   aspirin EC 81 MG tablet Take 81 mg by mouth daily.   blood glucose meter kit and supplies Dispense based on patient and insurance preference. Use up to four times daily as directed. (FOR ICD-10 E10.9, E11.9).   cetirizine 10 MG tablet Commonly known as: ZYRTEC Take 10 mg by mouth daily.   colchicine 0.6 MG tablet Take by mouth.   Embrace Talk Glucose Test test strip Generic drug: glucose blood USE TO CHECK BLOOD SUGARS FOUR TIMES A DAY BEFORE MEALS AND AT BEDTIME DX E11.29   esomeprazole 40 MG capsule Commonly known as: NEXIUM Take 1 capsule (40 mg total) by mouth daily.   FreeStyle [place] 2 Reader [place] As instructed. iddm with complications - retinopathy, neuropathy, nephropathy. With device and sensors.   gabapentin 300 MG capsule Commonly known as: NEURONTIN TAKE 1 CAPSULE (300 MG TOTAL) BY MOUTH 3 (THREE) TIMES DAILY. What changed: how much to take   HumaLOG KwikPen 100 UNIT/ML KwikPen Generic drug: insulin lispro Max Daily 30 units   hydrOXYzine 25 MG tablet Commonly known as: ATARAX Take 0.5 tablets (12.5 mg total) by mouth every 8 (eight) hours as needed for itching.   levothyroxine 150 MCG tablet Commonly known as: SYNTHROID Take 1 tablet (150 mcg total) by mouth daily. Needs appt for future refills   linaclotide 145 MCG Caps capsule Commonly known as: LINZESS Take 145 mcg by mouth daily as needed (constipation).   naproxen 500 MG tablet Commonly known as: NAPROSYN Take 500 mg by mouth 2 (two) times daily with a meal.   olmesartan 20 MG tablet Commonly known as: BENICAR Take 10 mg by mouth daily.   ondansetron 4 MG disintegrating tablet Commonly known as: Zofran ODT Take 1 tablet (4 mg total) by  mouth every 8 (eight) hours as needed for nausea or vomiting.   Pen Needles 31G X 8 MM Misc Inject 1 Device into the skin in the morning, at noon, in the evening, and at bedtime.   potassium chloride SA 20 MEQ tablet Commonly known as: KLOR-CON M Take 1 tablet (20 mEq total) by mouth daily. Need appt for future refills   Prodigy Twist Top Lancets 28G Misc USE TO CHECK BLOOD SUGARS   simvastatin 20 MG tablet Commonly known as: ZOCOR Take 1 tablet (20 mg total) by mouth every evening.   spironolactone 25 MG tablet Commonly known  as: ALDACTONE Take 0.5 tablets (12.5 mg total) by mouth daily. Needs appt for future refills   tiZANidine 2 MG tablet Commonly known as: ZANAFLEX Take 2 mg by mouth 2 (two) times daily.   torsemide 20 MG tablet Commonly known as: DEMADEX Take 4 tablets (80 mg total) by mouth daily. Need appt for future refills   Tresiba FlexTouch 200 UNIT/ML FlexTouch Pen Generic drug: insulin degludec 14 Units.   Trulicity 1.5 OZ/3.6UY Sopn Generic drug: Dulaglutide Inject 1.5 mg into the skin once a week.         OBJECTIVE:   Vital Signs: BP (!) 146/90 (BP Location: Left Arm, Patient Position: Sitting, Cuff Size: Small)    Pulse 70    Ht '4\' 11"'  (1.499 m)    Wt 189 lb (85.7 kg)    SpO2 95%    BMI 38.17 kg/m   Wt Readings from Last 3 Encounters:  05/27/21 189 lb (85.7 kg)  05/06/21 191 lb (86.6 kg)  12/30/20 196 lb 6.4 oz (89.1 kg)     Exam: General: Pt appears well and is in NAD  Lungs: Clear with good BS bilat with no rales, rhonchi, or wheezes  Heart: RRR with normal S1 and S2 and no gallops; no murmurs; no rub  Extremities: Race  pretibial edema.   Neuro: MS is good with appropriate affect, pt is alert and Ox3       DM Foot Exam 05/06/2021   The skin of the feet shows pre-ulcerative callus at the right heel  The pedal pulses are 2+ on right and 2+ on left. The sensation is intact to a screening 5.07, 10 gram monofilament  bilaterally     DATA REVIEWED:  Lab Results  Component Value Date   HGBA1C 9.7 (A) 12/30/2020   HGBA1C 7.6 (A) 09/18/2020   HGBA1C 7.6 (A) 06/29/2020   Lab Results  Component Value Date   MICROALBUR 9.74 (H) 10/30/2012   LDLCALC 66 04/13/2020   CREATININE 1.49 (H) 06/08/2020   Results for SANAM, MARMO (MRN [id number]) as of 12/30/2020 09:40  Ref. Range 06/08/2020 15:43  Sodium Latest Ref Range: 134 - 144 mmol/L 145 (H)  Potassium Latest Ref Range: 3.5 - 5.2 mmol/L 4.6  Chloride Latest Ref Range: 96 - 106 mmol/L 107 (H)  CO2 Latest Ref Range: 20 - 29 mmol/L 26  Glucose Latest Ref Range: 65 - 99 mg/dL 63 (L)  BUN Latest Ref Range: 8 - 27 mg/dL 44 (H)  Creatinine Latest Ref Range: 0.57 - 1.00 mg/dL 1.49 (H)  Calcium Latest Ref Range: 8.7 - 10.3 mg/dL 9.9  BUN/Creatinine Ratio Latest Ref Range: 12 - 28  30 (H)  GFR, Est Non African American Latest Ref Range: >59 mL/min/1.73 38 (L)  GFR, Est African American Latest Ref Range: >59 mL/min/1.73 43 (L)       ASSESSMENT / PLAN / RECOMMENDATIONS:   1) Type 2 Diabetes Mellitus, Poorly controlled, With neuropathic, retinopathic and CKD III complications - Most recent A1c of 9.7 %. Goal A1c < 7.0 %.    - Her A1c remains above goal but stable  - She has not been able to use the CGM, this has been resolved during her visit today.  - This AM her fasting BG was 237 mg/dL but she only received Tresiba 8 units last night instead of 14 units as she ran out of insulin  - No changes today  - I will not provider her with a correction scale, she doesn't use  it  - Main barrier to diabetes self care if visual impairement  MEDICATIONS: -Continue Tresiba 14 units daily - Continue Trulicity 1.5 mg weekly  - Continue  Humalog 6 units with breakfast, 6 units with lunch, and 8 units with supper   EDUCATION / INSTRUCTIONS: BG monitoring instructions: Patient is instructed to check her blood sugars 3 times a day, before meals. Call [place]  Endocrinology clinic if: BG persistently < 70  I reviewed the Rule of 15 for the treatment of hypoglycemia in detail with the patient. Literature supplied.    2) Diabetic complications:  Eye: Does  have known diabetic retinopathy.  Neuro/ Feet: Does  have known diabetic peripheral neuropathy .  Renal: Patient does  have known baseline CKD. She   is not on an ACEI/ARB at present.      F/U in 6 months   Signed electronically by: Mack Guise, MD  [facility] Endocrinology  [address] [place], [place] [address] Phone: [phone] FAX: [phone]   CC: Roselee Nova, [place] [address] Phone: [phone]  Fax: [phone]  Return to Endocrinology clinic as below: Future Appointments  Date Time Provider [place]  06/10/2021 10:00 AM Voncille Lo [facility]  06/29/2021  9:30 AM [facility] DIAG [place] 2 [facility]  06/29/2021  9:40 AM [facility] [place] 2 [facility]  07/20/2021 10:30 AM Marzetta Board, DPM [facility]  08/09/2021 10:00 AM Debbora Presto, NP [facility] None  11/04/2021 10:30 AM Sebastin Perlmutter, Melanie Crazier, MD [facility] None

## 2021-05-31 ENCOUNTER — Telehealth: Payer: Self-pay | Admitting: Internal Medicine

## 2021-06-02 ENCOUNTER — Ambulatory Visit: Payer: Medicare Other | Admitting: Podiatry

## 2021-06-05 ENCOUNTER — Ambulatory Visit (HOSPITAL_COMMUNITY)
Admission: EM | Admit: 2021-06-05 | Discharge: 2021-06-05 | Disposition: A | Discharge status: Home / Self Care | Payer: Medicare Other | Attending: Medical Oncology | Admitting: Medical Oncology

## 2021-06-05 ENCOUNTER — Other Ambulatory Visit: Payer: Self-pay

## 2021-06-05 ENCOUNTER — Encounter (HOSPITAL_COMMUNITY): Payer: Self-pay | Admitting: Emergency Medicine

## 2021-06-05 DIAGNOSIS — L0100 Impetigo, unspecified: Secondary | ICD-10-CM | POA: Diagnosis not present

## 2021-06-05 MED ORDER — CEPHALEXIN 250 MG PO CAPS: 250.0000 mg | ORAL_CAPSULE | Freq: Four times a day (QID) | ORAL | 0 refills | Status: DC

## 2021-06-05 NOTE — ED Triage Notes (Signed)
Pt reports for a week having sore is nose. Unsure if related to her cpap or not. Will hurt when moves nose around a certain way.

## 2021-06-05 NOTE — ED Provider Notes (Signed)
Amy [name]    CSN: [id number] Arrival date & time: 06/05/21  1413      History   Chief Complaint Chief Complaint  Patient presents with   Facial Pain    HPI Amy [name] is a 61 y.o. female.   HPI  Facial Pain: Patient reports that she has had some pain and soreness of her nose for the past week. Hurts when she moves her head in a certain way. She thinks this may be related to her CPAP machine but is unsure. She has tried vaseline and neosporin on the area.   Past Medical History:  Diagnosis Date   Arthritis    Asthma    Blind    Blindness and low vision    left eye glass eye,  legally blind in right eye   Diabetes mellitus    Diabetic neuropathy ([place])    Hyperlipidemia    Hypertension    Hypothyroidism    Spinal stenosis     Patient Active Problem List   Diagnosis Date Noted   Dysphagia 04/07/2021   Gastro-esophageal reflux disease without esophagitis 04/07/2021   Personal history of colonic polyps 04/07/2021   Bilateral impacted cerumen 02/25/2021   Sensorineural hearing loss (SNHL), bilateral 02/25/2021   Type 2 diabetes mellitus with diabetic polyneuropathy, with long-term current use of insulin ([place]) 40/98/1191   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/29/2020   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, with long-term current use of insulin ([place]) 06/29/2020   Type 2 diabetes mellitus with hyperglycemia, with long-term current use of insulin ([place]) 06/29/2020   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 03/31/2020   Pre-operative cardiovascular examination 02/18/2019   Septic bursitis of elbow, right 10/30/2018   CRI (chronic renal insufficiency), stage 3 (moderate) 07/26/2018   Morbid obesity ([place]) 07/26/2018   Intractable nausea and vomiting 03/06/2018   Right leg pain 12/19/2017   Leukocytosis 12/19/2017   Benign positional  vertigo 12/19/2017   Weakness 12/12/2017   Laryngopharyngeal reflux (LPR) 10/19/2017   Post-nasal drainage 10/19/2017   Acute sinusitis 10/19/2017   Degeneration of lumbar intervertebral disc 07/29/2017   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, left 03/08/2017   Obstructive sleep apnea on CPAP 02/09/2017   Chronic diastolic heart failure ([place]) 11/08/2016   Near syncope 01/30/2016   SOB (shortness of breath)    CAP (community acquired pneumonia) 09/12/2015   Hypoxia 09/12/2015   Essential hypertension 07/05/2015   Hypotension 07/05/2015   Diabetes mellitus with neurological manifestations ([place]) 11/04/2014   Hyperlipidemia LDL goal <70 11/04/2014   Spinal stenosis, multilevel 11/04/2014   Hyperkalemia 11/01/2014   Hypoglycemia 08/09/2014   Type 2 diabetes mellitus with complication, with long-term current use of insulin ([place]) 08/08/2014   Elevated troponin 08/08/2014   Nausea vomiting and diarrhea 08/08/2014   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   Dizziness 08/06/2012   Orthostatic hypotension 08/06/2012   Hypernatremia 08/06/2012   Acute gastroenteritis 08/13/2011   Gastroparesis 08/13/2011   Low back pain 08/13/2011   Oral thrush 08/13/2011   Gastroenteritis 05/23/2011   Dehydration 05/23/2011   Blindness 05/23/2011   DM type 1, not at goal, causing eye disease ([place]) 05/23/2011   Asthma 05/23/2011   Diarrhea 05/23/2011   Vomiting 05/23/2011   Hypothyroidism 05/23/2011   Diabetic neuropathy (  [place]) 05/23/2011   Diabetic nephropathy ([place]) 05/23/2011    Past Surgical History:  Procedure Laterality Date   ABDOMINAL HYSTERECTOMY  [place]   x 2   CHOLECYSTECTOMY  2008   ENUCLEATION Bilateral 09/15/1998   EYE SURGERY  2016   fitting artificial eye    OB History   No obstetric history on file.    Obstetric Comments   Pt has two sons.          Home Medications    Prior to Admission medications   Medication Sig Start Date End Date Taking? Authorizing Provider  cephALEXin (KEFLEX) 250 MG capsule Take 1 capsule (250 mg total) by mouth 4 (four) times daily. 06/05/21  Yes Hooria Gasparini M, PA-C  albuterol (PROVENTIL HFA;VENTOLIN HFA) 108 (90 BASE) MCG/ACT inhaler Inhale 2 puffs into the lungs every 4 (four) hours as needed for wheezing or shortness of breath. For short    [provider]  allopurinol (ZYLOPRIM) 300 MG tablet Take 300 mg by mouth 2 (two) times daily. 11/23/20   [provider]  aspirin EC 81 MG tablet Take 81 mg by mouth daily.    [provider]  blood glucose meter kit and supplies Dispense based on patient and insurance preference. Use up to four times daily as directed. (FOR ICD-10 E10.9, E11.9). 08/06/20   Wendie Agreste, MD  cetirizine (ZYRTEC) 10 MG tablet Take 10 mg by mouth daily.    [provider]  colchicine 0.6 MG tablet Take by mouth. 12/14/20   [provider]  Continuous Blood Gluc Receiver (FREESTYLE LIBRE 2 READER) DEVI As instructed. iddm with complications - retinopathy, neuropathy, nephropathy. With device and sensors. 08/06/20   Wendie Agreste, MD  Dulaglutide (TRULICITY) 1.5 ZO/1.0RU SOPN Inject 1.5 mg into the skin once a week. 05/06/21   Shamleffer, Melanie Crazier, MD  EMBRACE TALK GLUCOSE TEST test strip USE TO CHECK BLOOD SUGARS FOUR TIMES A DAY BEFORE MEALS AND AT BEDTIME DX E11.29 07/29/20   Wendie Agreste, MD  esomeprazole (NEXIUM) 40 MG capsule Take 1 capsule (40 mg total) by mouth daily. 07/10/20   Bensimhon, Shaune Pascal, MD  gabapentin (NEURONTIN) 300 MG capsule TAKE 1 CAPSULE (300 MG TOTAL) BY MOUTH 3 (THREE) TIMES DAILY. Patient taking differently: Take 400 mg by mouth 3 (three) times daily. 12/21/20   Hilts, Legrand Como, MD  HUMALOG KWIKPEN 100 UNIT/ML KwikPen Max Daily 30 units 09/18/20   Shamleffer, Melanie Crazier,  MD  hydrOXYzine (ATARAX/VISTARIL) 25 MG tablet Take 0.5 tablets (12.5 mg total) by mouth every 8 (eight) hours as needed for itching. 12/05/20   Jaynee Eagles, PA-C  Insulin Pen Needle (PEN NEEDLES) 31G X 8 MM MISC Inject 1 Device into the skin in the morning, at noon, in the evening, and at bedtime. 07/01/20   Shamleffer, Melanie Crazier, MD  levothyroxine (SYNTHROID) 150 MCG tablet Take 1 tablet (150 mcg total) by mouth daily. Needs appt for future refills 08/06/20   Wendie Agreste, MD  linaclotide [facility]) 145 MCG CAPS capsule Take 145 mcg by mouth daily as needed (constipation).     [provider]  naproxen (NAPROSYN) 500 MG tablet Take 500 mg by mouth 2 (two) times daily with a meal.    [provider]  olmesartan (BENICAR) 20 MG tablet Take 10 mg by mouth daily. 04/26/21   [provider]  ondansetron (ZOFRAN ODT) 4 MG disintegrating tablet Take 1 tablet (4 mg total)  by mouth every 8 (eight) hours as needed for nausea or vomiting. 07/09/19   Antonietta Breach, PA-C  potassium chloride SA (KLOR-CON) 20 MEQ tablet Take 1 tablet (20 mEq total) by mouth daily. Need appt for future refills 08/06/20   Wendie Agreste, MD  Prodigy Twist Top Lancets 28G MISC USE TO CHECK BLOOD SUGARS 10/02/20   [provider]  simvastatin (ZOCOR) 20 MG tablet Take 1 tablet (20 mg total) by mouth every evening. 08/06/20   Wendie Agreste, MD  spironolactone (ALDACTONE) 25 MG tablet Take 0.5 tablets (12.5 mg total) by mouth daily. Needs appt for future refills 08/06/20   Wendie Agreste, MD  tiZANidine (ZANAFLEX) 2 MG tablet Take 2 mg by mouth 2 (two) times daily. 10/02/20   [provider]  torsemide (DEMADEX) 20 MG tablet Take 4 tablets (80 mg total) by mouth daily. Need appt for future refills 08/06/20   Wendie Agreste, MD  TRESIBA FLEXTOUCH 200 UNIT/ML FlexTouch Pen 14 Units. 04/30/21   [provider]    Family History Family History  Problem Relation Age of  Onset   Diabetes Sister    Glaucoma Sister    Hypertension Sister    Healthy Mother    Healthy Father    Stroke Brother    Heart attack Paternal Aunt    Breast cancer Neg Hx     Social History Social History   Tobacco Use   Smoking status: Former    Types: Cigarettes    Quit date: 05/22/1992    Years since quitting: 29.0   Smokeless tobacco: Never  Vaping Use   Vaping Use: Never used  Substance Use Topics   Alcohol use: No    Comment: quit 20 yrs ago   Drug use: No     Allergies   Penicillins, Tramadol, Vicodin [hydrocodone-acetaminophen], Codeine, Iodine-131, Iohexol, Lisinopril, and Sulfa antibiotics   Review of Systems Review of Systems  As stated above in HPI Physical Exam Triage Vital Signs ED Triage Vitals [06/05/21 1514]  Enc Vitals Group     BP (!) 151/58     Pulse Rate 77     Resp 18     Temp 99 F (37.2 C)     Temp Source Oral     SpO2 99 %     Weight      Height      Head Circumference      Peak Flow      Pain Score      Pain Loc      Pain Edu?      Excl. in [place]?    No data found.  Updated Vital Signs BP (!) 151/58 (BP Location: Right Arm)    Pulse 77    Temp 99 F (37.2 C) (Oral)    Resp 18    SpO2 99%   Physical Exam Vitals and nursing note reviewed.  Constitutional:      General: She is not in acute distress.    Appearance: Normal appearance. She is not ill-appearing, toxic-appearing or diaphoretic.  HENT:     Head: Normocephalic and atraumatic.     Nose:     Right Turbinates: Enlarged and swollen.     Left Turbinates: Enlarged and swollen.   Neurological:     Mental Status: She is alert.     UC Treatments / Results  Labs (all labs ordered are listed, but only abnormal results are displayed) Labs Reviewed - No data to display  EKG   Radiology No results found.  Procedures Procedures (including critical care time)  Medications Ordered in UC Medications - No data to display  Initial Impression / Assessment and  Plan / UC Course  I have reviewed the triage vital signs and the nursing notes.  Pertinent labs & imaging results that were available during my care of the patient were reviewed by me and considered in my medical decision making (see chart for details).     New.  Treating for impetigo with keflex.  We discussed how to use this medication along with red flag signs and symptoms especially with her PCN allergy.  We discussed that she can continue using Vaseline on the area.  ENT follow-up if symptoms fail to improve. Final Clinical Impressions(s) / UC Diagnoses   Final diagnoses:  Impetigo   Discharge Instructions   None    ED Prescriptions     Medication Sig Dispense Auth. Provider   cephALEXin (KEFLEX) 250 MG capsule Take 1 capsule (250 mg total) by mouth 4 (four) times daily. 28 capsule Hughie Closs, [place]      PDMP not reviewed this encounter.   Hughie Closs, [place] 06/05/21 1605

## 2021-06-10 ENCOUNTER — Other Ambulatory Visit: Payer: Self-pay

## 2021-06-10 ENCOUNTER — Ambulatory Visit: Payer: Medicare Other

## 2021-06-10 DIAGNOSIS — E1140 Type 2 diabetes mellitus with diabetic neuropathy, unspecified: Secondary | ICD-10-CM

## 2021-06-10 DIAGNOSIS — L97519 Non-pressure chronic ulcer of other part of right foot with unspecified severity: Secondary | ICD-10-CM

## 2021-06-10 NOTE — Progress Notes (Signed)
SITUATION Reason for Consult: Evaluation for Prefabricated Diabetic Shoes and Bilateral Custom Diabetic Inserts. Patient / Caregiver Report: Patient would like well fitting shoes  OBJECTIVE DATA: Patient History / Diagnosis:    ICD-10-CM   1. Type 2 diabetes mellitus with diabetic neuropathy, with long-term current use of insulin (HCC)  E11.40    Z79.4     2. Ulcer of right foot, unspecified ulcer stage ([place])  L97.519       Current or Previous Devices:   Historical DB shoes  In-Person Foot Examination: Ulcers & [place]:   Historical right foot  Toe / Foot Deformities:   - Hammertoes   Shoe Size: 75M  ORTHOTIC RECOMMENDATION Recommended Devices: - 1x pair prefabricated PDAC approved diabetic shoes: Patient selected - Apex [id number] 75M - 3x pair custom-to-patient vacuum formed diabetic insoles.   GOALS OF SHOES AND INSOLES - Reduce shear and pressure - Reduce / Prevent callus formation - Reduce / Prevent ulceration - Protect the fragile healing compromised diabetic foot.  Patient would benefit from diabetic shoes and inserts as patient has diabetes mellitus and the patient has one or more of the following conditions: - History of previous foot ulceration. - History of pre-ulcerative callus - Peripheral neuropathy with evidence of callus formation - Foot deformity - Poor circulation  ACTIONS PERFORMED Patient was casted for insoles via crush box and measured for shoes via brannock device. Procedure was explained and patient tolerated procedure well. All questions were answered and concerns addressed.  PLAN Patient is to ensure treating physician receives and completes diabetic paperwork. Casts and shoe order are to be held until paperwork is received. Once received patient is to be scheduled for fitting in four weeks.

## 2021-06-16 ENCOUNTER — Telehealth: Payer: Self-pay | Admitting: Internal Medicine

## 2021-06-16 NOTE — Telephone Encounter (Signed)
Pt came in dropped off [place] [employer] [place] form for [employer]oes for the provider to complete.  Upon completion pt would like for it to be faxed to [phone] (number is on the form).  Form placed in providers folder for completion. (Pt is aware that we ask for 7-10 business days for any form dropped off).

## 2021-06-17 ENCOUNTER — Ambulatory Visit
Admission: RE | Admit: 2021-06-17 | Discharge: 2021-06-17 | Disposition: A | Discharge status: Home / Self Care | Payer: Medicare Other | Source: Ambulatory Visit | Attending: Family Medicine | Admitting: Family Medicine

## 2021-06-17 ENCOUNTER — Other Ambulatory Visit: Payer: Self-pay | Admitting: Family Medicine

## 2021-06-17 ENCOUNTER — Ambulatory Visit
Admission: RE | Admit: 2021-06-17 | Discharge: 2021-06-17 | Disposition: A | Discharge status: Home / Self Care | Payer: Medicaid Other | Source: Ambulatory Visit | Attending: Family Medicine | Admitting: Family Medicine

## 2021-06-17 DIAGNOSIS — R928 Other abnormal and inconclusive findings on diagnostic imaging of breast: Secondary | ICD-10-CM

## 2021-06-17 DIAGNOSIS — N6310 Unspecified lump in the right breast, unspecified quadrant: Secondary | ICD-10-CM

## 2021-06-17 NOTE — Telephone Encounter (Signed)
VM left for patient.

## 2021-06-17 NOTE — Telephone Encounter (Signed)
Paperwork has been faxed to to [facility].

## 2021-06-18 ENCOUNTER — Ambulatory Visit: Payer: Medicare Other | Admitting: Podiatry

## 2021-06-22 DIAGNOSIS — J3489 Other specified disorders of nose and nasal sinuses: Secondary | ICD-10-CM | POA: Insufficient documentation

## 2021-06-24 NOTE — Telephone Encounter (Signed)
Erroneous encounter. Please disregard.

## 2021-06-29 ENCOUNTER — Other Ambulatory Visit: Payer: Commercial Managed Care - HMO

## 2021-07-07 ENCOUNTER — Ambulatory Visit: Payer: Medicare Other | Admitting: Podiatry

## 2021-07-08 ENCOUNTER — Other Ambulatory Visit: Payer: Self-pay

## 2021-07-08 ENCOUNTER — Ambulatory Visit (INDEPENDENT_AMBULATORY_CARE_PROVIDER_SITE_OTHER): Payer: Medicare Other | Admitting: Podiatry

## 2021-07-08 DIAGNOSIS — B353 Tinea pedis: Secondary | ICD-10-CM | POA: Diagnosis not present

## 2021-07-08 MED ORDER — CLOTRIMAZOLE-BETAMETHASONE 1-0.05 % EX CREA: 1.0000 "application " | TOPICAL_CREAM | Freq: Two times a day (BID) | CUTANEOUS | 0 refills | Status: DC

## 2021-07-08 NOTE — Progress Notes (Signed)
°  Subjective:  Patient ID: [name], female    DOB: [birth year],  MRN: [id number]  Chief Complaint  Patient presents with   Diabetic Ulcer     Left foot pus came out the side of her foot    61 y.o. female presents with the above complaint. History confirmed with patient.  She presents today with an issue that developed last month a blister and scaly patch developed on the left heel some drainage came out of it.  She also has itchy dry skin between the toes  Objective:  Physical Exam: warm, good capillary refill, no trophic changes or ulcerative lesions, normal DP and PT pulses, and dry scaling patch of skin medial heel, no cellulitis or active infection no ulceration, dry scaling skin between toes fourth interspace as well.  Assessment:   1. Tinea pedis of both feet      Plan:  Patient was evaluated and treated and all questions answered.  Suspect this is tinea pedis causing the issue that blistered and had some drainage.  There is no ulceration.  I reviewed the etiology and treatment options of this with her.  She also has a history of eczema and possibly could be this as well.  I prescribed her Lotrisone cream and she will use this daily for 1 month.  She will follow-up as needed with this or other issues.  No follow-ups on file.

## 2021-07-20 ENCOUNTER — Ambulatory Visit: Payer: Medicare Other | Admitting: Podiatry

## 2021-07-20 ENCOUNTER — Telehealth: Payer: Self-pay

## 2021-07-20 NOTE — Telephone Encounter (Signed)
Casts sent to central fabrication °

## 2021-07-21 ENCOUNTER — Encounter: Payer: Self-pay | Admitting: Internal Medicine

## 2021-07-21 ENCOUNTER — Ambulatory Visit (INDEPENDENT_AMBULATORY_CARE_PROVIDER_SITE_OTHER): Payer: Medicare Other | Admitting: Internal Medicine

## 2021-07-21 ENCOUNTER — Other Ambulatory Visit: Payer: Self-pay

## 2021-07-21 VITALS — BP 118/74 | HR 72 | Ht 59.0 in | Wt 182.0 lb

## 2021-07-21 DIAGNOSIS — E11319 Type 2 diabetes mellitus with unspecified diabetic retinopathy without macular edema: Secondary | ICD-10-CM | POA: Diagnosis not present

## 2021-07-21 DIAGNOSIS — E1142 Type 2 diabetes mellitus with diabetic polyneuropathy: Secondary | ICD-10-CM

## 2021-07-21 DIAGNOSIS — Z794 Long term (current) use of insulin: Secondary | ICD-10-CM

## 2021-07-21 DIAGNOSIS — E1165 Type 2 diabetes mellitus with hyperglycemia: Secondary | ICD-10-CM | POA: Diagnosis not present

## 2021-07-21 LAB — POCT GLUCOSE (DEVICE FOR HOME USE): Glucose Fasting, POC: 122 mg/dL — AB (ref 70–99)

## 2021-07-21 LAB — POCT GLYCOSYLATED HEMOGLOBIN (HGB A1C): Hemoglobin A1C: 6.6 % — AB (ref 4.0–5.6)

## 2021-07-21 NOTE — Patient Instructions (Addendum)
-   Decrease  Tresiba  12 units daily ?- Continue Trulicity 1.5  mg weekly  ?- Continue  Humalog  6 units with Breakfast, 6 units with Lunch and 8 units with Supper  ? ? ? ? ?HOW TO TREAT LOW BLOOD SUGARS (Blood sugar LESS THAN 70 MG/DL) ?Please follow the RULE OF 15 for the treatment of hypoglycemia treatment (when your (blood sugars are less than 70 mg/dL)  ? ?STEP 1: Take 15 grams of carbohydrates when your blood sugar is low, which includes:  ?3-4 GLUCOSE TABS  OR ?3-4 OZ OF JUICE OR REGULAR SODA OR ?ONE TUBE OF GLUCOSE GEL   ? ?STEP 2: RECHECK blood sugar in 15 MINUTES ?STEP 3: If your blood sugar is still low at the 15 minute recheck --> then, go back to STEP 1 and treat AGAIN with another 15 grams of carbohydrates ?

## 2021-07-21 NOTE — Progress Notes (Signed)
Name: Amy [name]  Age/ Sex: 61 y.o., female   MRN/ DOB: [id number], 08-16-1960     PCP: Roselee Nova, MD   Reason for Endocrinology Evaluation: Type 2 Diabetes Mellitus  Initial Endocrine Consultative Visit: 06/29/2020    PATIENT IDENTIFIER: Amy [name] is a 61 y.o. female with a past medical history of T2DM, Hypothyroidism and Dyslipidemia. The patient has followed with Endocrinology clinic since 06/29/2020 for consultative assistance with management of her diabetes.  DIABETIC HISTORY:  Amy [name] was diagnosed with T2DM in her 71's. Her hemoglobin A1c has ranged from 9.9% in 2021, peaking at 12.3% in 2014.   SUBJECTIVE:      Today (07/21/2021): Amy [name] is here for a follow up on diabetes management. She is accompanied by her assistant. She Has not been using the freestyle libre due to getting a new phone that is incompatible. Prodigy meter is giving her false readings    She felt poorly one morning and prodigy registered a BG reading of 140, she called EMT and BG reading was 36 mg/dL .  Denies nausea or Vomiting or diarrhea     Pt follows with Dr. Osborne Casco    HOME DIABETES REGIMEN:  Amy [name] 14 units daily Humalog 6/7/3 units  Trulicity 1.5 mg weekly ( Friday )     Statin: yes ACE-I/ARB: no     CONTINUOUS GLUCOSE MONITORING RECORD INTERPRETATION  : has not been using         DIABETIC COMPLICATIONS: Microvascular complications:   Denies: CKD III, neuropathy.  Left prosthetic eye. Blind right eye  Last Eye Exam: Completed 04/2020 ( Dr. 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 Her smoking use included cigarettes. She has never used smokeless tobacco. She reports that she does not drink alcohol and does not use drugs. Family History:  Family History  Problem Relation Age of Onset   Diabetes Sister    Glaucoma Sister    Hypertension Sister    Healthy Mother    Healthy Father    Stroke Brother    Heart attack Paternal Aunt    Breast cancer Neg Hx      HOME MEDICATIONS: Allergies as of 07/21/2021       Reactions   Penicillins Hives, Swelling   Has patient had a PCN reaction causing immediate rash, facial/tongue/throat swelling, SOB or lightheadedness with hypotension: yes- face swelling Has patient had a PCN reaction causing severe rash involving mucus membranes or skin necrosis: no Has patient had a PCN reaction that required hospitalization unknown (childhood allergy) Has patient had a PCN reaction occurring within the last 10 years: no If all of the above  answers are "NO", then may proceed with Cephalosporin use.   Tramadol Nausea And Vomiting   Intense nausea   Vicodin [hydrocodone-acetaminophen] Nausea And Vomiting   Codeine Nausea Only   Other reaction(s): Unknown   Iodine-131 Hives   Iohexol Hives   Pt developed itching and hives along with nasal congestion; needs 13 hour premeds for future studies, Onset Date: [id number]   Lisinopril Cough   Other reaction(s): Unknown   Sulfa Antibiotics Nausea And Vomiting        Medication List        Accurate as of July 21, 2021  9:49 AM. If you have any questions, ask your nurse or doctor.          STOP taking these medications    cephALEXin 250 MG capsule Commonly known as: KEFLEX Stopped by: Dorita Sciara, MD       TAKE these medications    albuterol 108 (90 Base) MCG/ACT inhaler Commonly known as: VENTOLIN HFA Inhale 2 puffs into the lungs every 4 (four) hours as needed for wheezing or shortness of breath. For short   allopurinol 300 MG tablet Commonly known as: ZYLOPRIM Take 300 mg by mouth 2 (two) times daily.   aspirin EC 81 MG tablet Take 81 mg by mouth daily.   blood glucose meter kit and supplies Dispense based on patient and insurance preference. Use up to four times daily as directed. (FOR ICD-10 E10.9, E11.9).   cetirizine 10 MG tablet Commonly known as: ZYRTEC Take 10 mg by mouth daily.   clotrimazole-betamethasone cream Commonly known as: Lotrisone Apply 1 application topically 2 (two) times daily.   colchicine 0.6 MG tablet Take by mouth.   Embrace Talk Glucose Test test strip Generic drug: glucose blood USE TO CHECK BLOOD SUGARS FOUR TIMES A DAY BEFORE MEALS AND AT BEDTIME DX E11.29   esomeprazole 40 MG capsule Commonly known as: NEXIUM Take 1 capsule (40 mg total) by mouth daily.   FreeStyle [place] 2 Reader [place] As instructed. iddm with complications - retinopathy, neuropathy, nephropathy. With device and sensors.   gabapentin 300 MG capsule Commonly known as: NEURONTIN TAKE 1 CAPSULE (300 MG TOTAL) BY MOUTH 3 (THREE) TIMES DAILY. What changed: how much to take   HumaLOG KwikPen 100 UNIT/ML KwikPen Generic drug: insulin lispro Max Daily 30 units   hydrOXYzine 25 MG tablet Commonly known as: ATARAX Take 0.5 tablets (12.5 mg total) by mouth every 8 (eight) hours as needed for itching.   levothyroxine 150 MCG tablet Commonly known as: SYNTHROID Take 1 tablet (150 mcg total) by mouth daily. Needs appt for future refills   linaclotide 145 MCG Caps capsule Commonly known as: LINZESS Take 145 mcg by mouth daily as needed (constipation).   naproxen 500 MG tablet Commonly known as: NAPROSYN Take 500 mg by mouth 2 (two) times daily  with a meal.   olmesartan 20 MG tablet Commonly known as: BENICAR Take 10 mg by mouth daily.   ondansetron 4 MG disintegrating tablet Commonly known as: Zofran ODT Take 1 tablet (4 mg total) by mouth every 8 (eight) hours as needed for nausea or vomiting.   Pen Needles 31G X 8 MM Misc Inject 1 Device into the skin in the morning, at noon, in the evening, and at bedtime.   potassium chloride SA 20 MEQ tablet Commonly known as: KLOR-CON M Take 1 tablet (20 mEq total) by mouth daily. Need appt for future refills   Prodigy Twist Top Lancets 28G Misc  USE TO CHECK BLOOD SUGARS   simvastatin 20 MG tablet Commonly known as: ZOCOR Take 1 tablet (20 mg total) by mouth every evening.   spironolactone 25 MG tablet Commonly known as: ALDACTONE Take 0.5 tablets (12.5 mg total) by mouth daily. Needs appt for future refills   tiZANidine 2 MG tablet Commonly known as: ZANAFLEX Take 2 mg by mouth 2 (two) times daily.   torsemide 20 MG tablet Commonly known as: DEMADEX Take 4 tablets (80 mg total) by mouth daily. Need appt for future refills   Tresiba FlexTouch 200 UNIT/ML FlexTouch Pen Generic drug: insulin degludec 14 Units.   Trulicity 1.5 TD/1.7OH Sopn Generic drug: Dulaglutide Inject 1.5 mg into the skin once a week.         OBJECTIVE:   Vital Signs: BP 118/74 (BP Location: Left Arm, Patient Position: Sitting, Cuff Size: Small)    Pulse 72    Ht _0  (1.499 m)    Wt 182 lb (82.6 kg)    SpO2 99%    BMI 36.76 kg/m   Wt Readings from Last 3 Encounters:  07/21/21 182 lb (82.6 kg)  05/27/21 189 lb (85.7 kg)  05/06/21 191 lb (86.6 kg)     Exam: General: Pt appears well and is in NAD  Lungs: Clear with good BS bilat with no rales, rhonchi, or wheezes  Heart: RRR with normal S1 and S2 and no gallops; no murmurs; no rub  Extremities: Race  pretibial edema.   Neuro: MS is good with appropriate affect, pt is alert and Ox3       DM Foot Exam 05/06/2021   The skin of  the feet shows pre-ulcerative callus at the right heel  The pedal pulses are 2+ on right and 2+ on left. The sensation is intact to a screening 5.07, 10 gram monofilament bilaterally     DATA REVIEWED:  Lab Results  Component Value Date   HGBA1C 9.7 (A) 12/30/2020   HGBA1C 7.6 (A) 09/18/2020   HGBA1C 7.6 (A) 06/29/2020   Lab Results  Component Value Date   MICROALBUR 9.74 (H) 10/30/2012   LDLCALC 66 04/13/2020   CREATININE 1.49 (H) 06/08/2020   Results for SAYDEE, ZOLMAN (MRN [id number]) as of 12/30/2020 09:40  Ref. Range 06/08/2020 15:43  Sodium Latest Ref Range: 134 - 144 mmol/L 145 (H)  Potassium Latest Ref Range: 3.5 - 5.2 mmol/L 4.6  Chloride Latest Ref Range: 96 - 106 mmol/L 107 (H)  CO2 Latest Ref Range: 20 - 29 mmol/L 26  Glucose Latest Ref Range: 65 - 99 mg/dL 63 (L)  BUN Latest Ref Range: 8 - 27 mg/dL 44 (H)  Creatinine Latest Ref Range: 0.57 - 1.00 mg/dL 1.49 (H)  Calcium Latest Ref Range: 8.7 - 10.3 mg/dL 9.9  BUN/Creatinine Ratio Latest Ref Range: 12 - 28  30 (H)  GFR, Est Non African American Latest Ref Range: >59 mL/min/1.73 38 (L)  GFR, Est African American Latest Ref Range: >59 mL/min/1.73 43 (L)       ASSESSMENT / PLAN / RECOMMENDATIONS:   1) Type 2 Diabetes Mellitus, Optimally  controlled, With neuropathic, retinopathic and CKD III complications - Most recent A1c of 6.6 %. Goal A1c < 7.0 %.    - Her A1c is optimal , no glucose data and the concern is hypoglycemia. Her fasting BG today is 122 mg/dL.  - I am going to reduce her basal insulin based on her endorsement of hypoglycemia one morning  - Main barrier to  diabetes self care if visual impairment - She was taken to our CDE for freestyle libre trouble shooting   MEDICATIONS: - Decrease Tresiba 12 units daily - Continue Trulicity 1.5 mg weekly  - Continue  Humalog 6 units with breakfast, 6 units with lunch, and 8 units with supper   EDUCATION / INSTRUCTIONS: BG monitoring instructions: Patient  is instructed to check her blood sugars 3 times a day, before meals. Call [place] if: BG persistently < 70  I reviewed the Rule of 15 for the treatment of hypoglycemia in detail with the patient. Literature supplied.    2) Diabetic complications:  Eye: Does  have known diabetic retinopathy.  Neuro/ Feet: Does  have known diabetic peripheral neuropathy .  Renal: Patient does  have known baseline CKD. She   is not on an ACEI/ARB at present.      F/U in 6 months   Signed electronically by: Mack Guise, MD  [facility]  [address] [place], [place] [address] Phone: [phone] FAX: [phone]   CC: Roselee Nova, [place] [address] Phone: [phone]  Fax: [phone]  Return to Endocrinology clinic as below: Future Appointments  Date Time Provider [place]  07/21/2021 10:10 AM Charleston Hankin, Melanie Crazier, MD [facility] None  08/09/2021 10:00 AM Debbora Presto, NP [facility] None  09/10/2021 11:30 AM Marzetta Board, DPM [facility]  11/04/2021 10:30 AM Meoshia Billing, Melanie Crazier, MD [facility] None  12/15/2021 10:10 AM [facility] [place] [facility]

## 2021-07-23 ENCOUNTER — Other Ambulatory Visit: Payer: Self-pay | Admitting: Internal Medicine

## 2021-07-23 DIAGNOSIS — Z794 Long term (current) use of insulin: Secondary | ICD-10-CM

## 2021-07-23 DIAGNOSIS — E1165 Type 2 diabetes mellitus with hyperglycemia: Secondary | ICD-10-CM

## 2021-08-05 NOTE — Progress Notes (Deleted)
? ? ?[name] ?DOB: 11-04-1960 ? ?REASON FOR VISIT: follow up ?HISTORY FROM: patient ? ?No chief complaint on file. ?  ? ?HISTORY OF PRESENT ILLNESS: ? ?08/05/21 ALL: ?Amy [name] returns for follow up for OSA on CPAP. She was last seen 09/2019.  ? ? ? ?10/09/2019 ALL:  ?Amy [name] is a 61 y.o. female here today for follow up of OSA on CPAP. She is doing well. She has had some concerns of congestion and allergies over the past week. She feels that CPAP is hard to use when this happens. She feels that pressure is too strong for her. She usually tolerates CPAP well and denies any persistent concerns.  ? ?Compliance report dated 09/08/2019 through 10/07/2019 reveals that she used CPAP 29 of the past 30 days for compliance of 97%.  She used CPAP greater than 4 hours 23 of the past 30 days for compliance of 77%.  Average usage was 5 hours and 46 minutes.  Residual AHI was 4.1 on 8 cm of water and an EPR of 3.  There was no significant leak noted. ? ?HISTORY: (copied from [employer] note on 09/01/2017) ? ?Amy [name] is a 61 year old right-handed woman with an underlying medical history of diabetes, blindness (with L prosthetic eye and R eye blindness d/t diabetes), hyperlipidemia, asthma, arthritis, spinal stenosis, thyroid disease, nephropathy, orthostatic hypotension deemed secondary to diabetic autonomic neuropathy (seen by Dr. Leta Baptist in 9/17), and morbid obesity, who presents for follow up consultation of her sleep apnea, after recent sleep study testing. The patient is unaccompanied today. I first met her on 09/22/16, at the request of her PCP, at which time she reported snoring and excessive daytime somnolence. I invited her for a sleep study. She had a baseline sleep study, followed by a CPAP titration study. I went over her test results with her in detail today. Baseline sleep study from 10/02/2016 showed a sleep efficiency of 84.8%, sleep latency of 18.5 minutes, REM latency of 72.5 minutes, she  had absence of slow-wave sleep, an increased percentage of stage II sleep, REM sleep at 16.8%. Total AHI was 6.8 per hour, REM AHI 28 per hour, supine sleep was nearly absent. Average oxygen saturation was 95%, nadir was 78%, time below 89% saturation of 24 minutes. Based on her sleep-related complaints and significant desaturations noted during REM sleep I suggested she return for a full night CPAP titration study. She had this on 10/18/2016, sleep efficiency was 85.4%, sleep latency of 34 minutes, REM latency was 194 minutes. She had slow-wave sleep at 7.8%, REM sleep at 12.1%. She was fitted with medium nasal pillows and CPAP was titrated from 5 cm to 9 cm. On the final pressure her AHI was 0 per hour, nonsupine REM sleep was achieved and alternated was 86%. I suggested a home CPAP pressure of 10 cm. ? ?12/29/16 Dr. Rexene Alberts I reviewed her CPAP compliance data from 11/28/16 to 12/27/16, which is a total of 30 days, during which time she used her CPAP 26 days, with percent used days greater than 4 hours of 63%, indicating suboptimal compliance, average usage of 5 hours and 27 minutes, average AHI of 3/hour, leak low with the 95th percentile of 2.8 lpm on a pressure of 10 cm with EPR of 3 ? ?UPDATE  09/27/2018CM  [name], 61 year old  Female returns for follow-up with a history of obstructive sleep apnea on CPAP Compliance data dated 01/10/2017 through 02/08/2017 shows greater than 4 hours at 90% for  27  Days. Average usage 6 hours 37  Minutes.  10 cm of pressure. EPR 3. AHI 2.9AHI 2.9. Leak low  with the 95th percentile.  She reports less daytime drowiness. She has a new caregiver  who requires education on cleaning and handling the CPAP equipment. She returns for reevaluation ? ?UPDATE 4/24/2019CM Amy [name], 61 year old female returns for follow-up with a history of  obstructive sleep apnea on CPAP.  She is not having any problems with her machine she denies any air leak from her mask.  She reports the last few days   she has been stuffy and not use the machine, she has allergies.  CPAP compliance dated 08/05/2017-09/03/2017 showed compliance greater than 4 hours 83% for 25 days.  Average usage 6 hours 38.  Set pressure 8 cm.  AHI 3.6 leaks 95 percentile at 2.4.  She has a caregiver 4 hours a day from 10-2 the patient is blind.  She returns for reevaluation of ?  ? ? ?REVIEW OF SYSTEMS: Out of a complete 14 system review of symptoms, the patient complains only of the following symptoms, none and all other reviewed systems are negative. ? ? ?ALLERGIES: ?Allergies  ?Allergen Reactions  ? Penicillins Hives and Swelling  ?  Has patient had a PCN reaction causing immediate rash, facial/tongue/throat swelling, SOB or lightheadedness with hypotension: yes- face swelling ?Has patient had a PCN reaction causing severe rash involving mucus membranes or skin necrosis: no ?Has patient had a PCN reaction that required hospitalization unknown (childhood allergy) ?Has patient had a PCN reaction occurring within the last 10 years: no ?If all of the above answers are "NO", then may proceed with Cephalosporin use. ?  ? Tramadol Nausea And Vomiting  ?  Intense nausea  ? Vicodin [Hydrocodone-Acetaminophen] Nausea And Vomiting  ? Codeine Nausea Only  ?  Other reaction(s): Unknown  ? Iodine-131 Hives  ? Iohexol Hives  ?  Pt developed itching and hives along with nasal congestion; needs 13 hour premeds for future studies, Onset Date: [id number] ?  ? Lisinopril Cough  ?  Other reaction(s): Unknown  ? Sulfa Antibiotics Nausea And Vomiting  ? ? ?HOME MEDICATIONS: ?Outpatient Medications Prior to Visit  ?Medication Sig Dispense Refill  ? albuterol (PROVENTIL HFA;VENTOLIN HFA) 108 (90 BASE) MCG/ACT inhaler Inhale 2 puffs into the lungs every 4 (four) hours as needed for wheezing or shortness of breath. For short    ? allopurinol (ZYLOPRIM) 300 MG tablet Take 300 mg by mouth 2 (two) times daily.    ? aspirin EC 81 MG tablet Take 81 mg by mouth daily.    ? blood  glucose meter kit and supplies Dispense based on patient and insurance preference. Use up to four times daily as directed. (FOR ICD-10 E10.9, E11.9). 1 each 0  ? cetirizine (ZYRTEC) 10 MG tablet Take 10 mg by mouth daily.    ? clotrimazole-betamethasone (LOTRISONE) cream Apply 1 application topically 2 (two) times daily. 30 g 0  ? colchicine 0.6 MG tablet Take by mouth.    ? Continuous Blood Gluc Receiver (FREESTYLE LIBRE 2 READER) [place] As instructed. iddm with complications - retinopathy, neuropathy, nephropathy. ?With device and sensors. (Patient not taking: Reported on 07/21/2021) 1 each 0  ? Dulaglutide (TRULICITY) 1.5 OE/4.2PN SOPN Inject 1.5 mg into the skin once a week. 6 mL 2  ? EMBRACE TALK GLUCOSE TEST test strip USE TO CHECK BLOOD SUGARS FOUR TIMES A DAY BEFORE MEALS AND AT BEDTIME DX E11.29 300 strip 3  ?  esomeprazole (NEXIUM) 40 MG capsule Take 1 capsule (40 mg total) by mouth daily. 30 capsule 0  ? gabapentin (NEURONTIN) 300 MG capsule TAKE 1 CAPSULE (300 MG TOTAL) BY MOUTH 3 (THREE) TIMES DAILY. (Patient taking differently: Take 400 mg by mouth 3 (three) times daily.) 90 capsule 3  ? HUMALOG KWIKPEN 100 UNIT/ML KwikPen Max Daily 30 units 30 mL 3  ? hydrOXYzine (ATARAX/VISTARIL) 25 MG tablet Take 0.5 tablets (12.5 mg total) by mouth every 8 (eight) hours as needed for itching. 30 tablet 0  ? Insulin Pen Needle (PEN NEEDLES) 31G X 8 MM MISC Inject 1 Device into the skin in the morning, at noon, in the evening, and at bedtime. 400 each 3  ? levothyroxine (SYNTHROID) 150 MCG tablet Take 1 tablet (150 mcg total) by mouth daily. Needs appt for future refills 90 tablet 0  ? linaclotide (LINZESS) 145 MCG CAPS capsule Take 145 mcg by mouth daily as needed (constipation).     ? naproxen (NAPROSYN) 500 MG tablet Take 500 mg by mouth 2 (two) times daily with a meal.    ? olmesartan (BENICAR) 20 MG tablet Take 10 mg by mouth daily.    ? ondansetron (ZOFRAN ODT) 4 MG disintegrating tablet Take 1 tablet (4 mg total)  by mouth every 8 (eight) hours as needed for nausea or vomiting. 10 tablet 0  ? potassium chloride SA (KLOR-CON) 20 MEQ tablet Take 1 tablet (20 mEq total) by mouth daily. Need appt for future refill

## 2021-08-05 NOTE — Patient Instructions (Incomplete)

## 2021-08-09 ENCOUNTER — Ambulatory Visit: Payer: Medicaid Other | Admitting: Family Medicine

## 2021-08-09 ENCOUNTER — Telehealth: Payer: Self-pay | Admitting: Family Medicine

## 2021-08-09 DIAGNOSIS — G4733 Obstructive sleep apnea (adult) (pediatric): Secondary | ICD-10-CM

## 2021-08-09 NOTE — Telephone Encounter (Signed)
Pt  cancelled appt due to not feeling well ?

## 2021-08-16 ENCOUNTER — Encounter: Payer: Medicare Other | Attending: Internal Medicine | Admitting: Nutrition

## 2021-08-16 DIAGNOSIS — Z713 Dietary counseling and surveillance: Secondary | ICD-10-CM | POA: Diagnosis not present

## 2021-08-16 DIAGNOSIS — Z794 Long term (current) use of insulin: Secondary | ICD-10-CM | POA: Insufficient documentation

## 2021-08-16 DIAGNOSIS — E1165 Type 2 diabetes mellitus with hyperglycemia: Secondary | ICD-10-CM | POA: Insufficient documentation

## 2021-08-16 NOTE — Patient Instructions (Signed)
Apply a new sensor and call me when you have done this.  ?Change out sensor every 14 days. ?

## 2021-08-16 NOTE — Progress Notes (Signed)
Patient is here with her new phone, but did not bring sensors.  We downloaded the [place] 2 app to her phone, and her aid was trained on how to apply the [place] sensor, and where to place this.  Patient says that she has "a lot of sensors at home".  They will apply the sensor when they get home, and  call me to let me know that they this today.  The app readings were relinked to [place] via her new app.   ?They had no final questions. ? ?*Late note:  patient's care giver called me to let me know that the sensor was starter vial the app, and that they were waiting the hour for it to warm up.   ?

## 2021-08-19 ENCOUNTER — Ambulatory Visit: Payer: Medicare Other

## 2021-08-30 ENCOUNTER — Ambulatory Visit: Payer: Medicare Other | Admitting: Podiatry

## 2021-08-31 ENCOUNTER — Telehealth: Payer: Self-pay

## 2021-08-31 NOTE — Telephone Encounter (Signed)
Left a vm for patient to callback regarding high sugars.  ?

## 2021-09-01 NOTE — Telephone Encounter (Signed)
Patient has been having some high sugar readings. Meter report has been placed on your desk . Patient has been taking her medications correctly.  ?

## 2021-09-01 NOTE — Telephone Encounter (Signed)
Patient notified and will continue to take her medication . ?  ?

## 2021-09-02 ENCOUNTER — Encounter: Payer: Self-pay | Admitting: Internal Medicine

## 2021-09-10 ENCOUNTER — Ambulatory Visit (INDEPENDENT_AMBULATORY_CARE_PROVIDER_SITE_OTHER): Payer: Medicare Other | Admitting: Podiatry

## 2021-09-10 ENCOUNTER — Ambulatory Visit (INDEPENDENT_AMBULATORY_CARE_PROVIDER_SITE_OTHER): Payer: Medicare Other

## 2021-09-10 ENCOUNTER — Encounter: Payer: Self-pay | Admitting: Podiatry

## 2021-09-10 DIAGNOSIS — L97519 Non-pressure chronic ulcer of other part of right foot with unspecified severity: Secondary | ICD-10-CM | POA: Diagnosis not present

## 2021-09-10 DIAGNOSIS — E1142 Type 2 diabetes mellitus with diabetic polyneuropathy: Secondary | ICD-10-CM | POA: Diagnosis not present

## 2021-09-10 DIAGNOSIS — B351 Tinea unguium: Secondary | ICD-10-CM

## 2021-09-10 DIAGNOSIS — M79676 Pain in unspecified toe(s): Secondary | ICD-10-CM | POA: Diagnosis not present

## 2021-09-10 NOTE — Progress Notes (Signed)
SITUATION ?Reason for Visit: Fitting of Diabetic [place] ?Patient / Caregiver Report:  Patient is satisfied with fit and function of shoes and insoles. ? ?OBJECTIVE DATA: ?Patient History / Diagnosis:   ?  ICD-10-CM   ?1. Diabetic peripheral neuropathy associated with type 2 diabetes mellitus ([place])  E11.42   ?  ?2. Ulcer of right foot, unspecified ulcer stage ([place])  L97.519   ?  ? ? ?Change in Status:   None ? ?ACTIONS PERFORMED: ?In-Person Delivery, patient was fit with: ?- 1x pair [id number] PDAC approved prefabricated Diabetic Shoes: Apex [id number] 7W ?- 3x pair [id number] PDAC approved vacuum formed custom diabetic insoles; [employer]: [id number] ? ?Shoes and insoles were verified for structural integrity and safety. Patient wore shoes and insoles in office. Skin was inspected and free of areas of concern after wearing shoes and inserts. Shoes and inserts fit properly. Patient / Caregiver provided with ferbal instruction and demonstration regarding donning, doffing, wear, care, proper fit, function, purpose, cleaning, and use of shoes and insoles ' and in all related precautions and risks and benefits regarding shoes and insoles. Patient / Caregiver was instructed to wear properly fitting socks with shoes at all times. Patient was also provided with verbal instruction regarding how to report any failures or malfunctions of shoes or inserts, and necessary follow up care. Patient / Caregiver was also instructed to contact physician regarding change in status that may affect function of shoes and inserts.  ? ?Patient / Caregiver verbalized undersatnding of instruction provided. Patient / Caregiver demonstrated independence with proper donning and doffing of shoes and inserts. ? ?PLAN ?Patient to follow with treating physician as recommended. Plan of care was discussed with and agreed upon by patient and/or caregiver. All questions were answered and concerns addressed. ? ?

## 2021-09-19 ENCOUNTER — Encounter: Payer: Self-pay | Admitting: Podiatry

## 2021-09-19 NOTE — Progress Notes (Signed)
?Subjective:  ?Patient ID: Amy [name], female    DOB: 09/24/1960,  MRN: [id number] ? ?AVIONA MARTENSON presents to clinic today for at risk foot care with history of diabetic neuropathy and callus(es) bilateral heels and painful thick toenails that are difficult to trim. Painful toenails interfere with ambulation. Aggravating factors include wearing enclosed shoe gear. Pain is relieved with periodic professional debridement. Painful calluses are aggravated when weightbearing with and without shoegear. Pain is relieved with periodic professional debridement. ? ?Last known HgA1c was 6.6%. ? ?New problem(s): None.  ? ?She is accompanied by her caregiver on today's visit. ? ?PCP is Roselee Nova, MD , and last visit was November, 2022. ? ?Allergies  ?Allergen Reactions  ? Penicillins Hives and Swelling  ?  Has patient had a PCN reaction causing immediate rash, facial/tongue/throat swelling, SOB or lightheadedness with hypotension: yes- face swelling ?Has patient had a PCN reaction causing severe rash involving mucus membranes or skin necrosis: no ?Has patient had a PCN reaction that required hospitalization unknown (childhood allergy) ?Has patient had a PCN reaction occurring within the last 10 years: no ?If all of the above answers are "NO", then may proceed with Cephalosporin use. ?  ? Tramadol Nausea And Vomiting  ?  Intense nausea  ? Vicodin [Hydrocodone-Acetaminophen] Nausea And Vomiting  ? Codeine Nausea Only  ?  Other reaction(s): Unknown  ? Iodine-131 Hives  ? Iohexol Hives  ?  Pt developed itching and hives along with nasal congestion; needs 13 hour premeds for future studies, Onset Date: [id number] ?  ? Lisinopril Cough  ?  Other reaction(s): Unknown  ? Sulfa Antibiotics Nausea And Vomiting  ? ?Review of Systems: Negative except as noted in the HPI. ? ?Objective: No changes noted in today's physical examination. ?Constitutional Amy [name] is a pleasant 61 y.o. African American female, obese in NAD.  AAO x 3.   ?Vascular CFT <3 seconds b/l LE. Faintly palpable DP pulses b/l LE. Faintly palpable PT pulse(s) b/l LE. Pedal hair sparse. No pain with calf compression b/l. Lower extremity skin temperature gradient within normal limits. Trace edema noted BLE. No ischemia or gangrene noted b/l LE. No cyanosis or clubbing noted b/l LE. No cyanosis or clubbing noted.  ?Neurologic Normal speech. Oriented to person, place, and time. Pt has subjective symptoms of neuropathy. Protective sensation intact 5/5 intact bilaterally with 10g monofilament b/l. Vibratory sensation intact b/l.  ?Dermatologic No open wounds b/l LE. No interdigital macerations noted b/l LE. Toenails 1-5 b/l elongated, discolored, dystrophic, thickened, crumbly with subungual debris and tenderness to dorsal palpation. No calluses noted b/l heels today.  ?Orthopedic: Normal muscle strength 5/5 to all lower extremity muscle groups bilaterally. No pain, crepitus or joint limitation noted with ROM b/l LE. HAV with bunion deformity noted b/l LE. Hammertoe deformity noted 2-5 b/l.Marland Kitchen Patient ambulates with wheelchair assistance.  ? ?Radiographs: None ? ?  Latest Ref Rng & Units 07/21/2021  ?  9:52 AM 12/30/2020  ?  9:25 AM  ?Hemoglobin A1C  ?Hemoglobin-A1c 4.0 - 5.6 % 6.6   9.7    ? ?Assessment/Plan: ?1. Pain due to onychomycosis of toenail   ?2. Diabetic peripheral neuropathy associated with type 2 diabetes mellitus ([place])   ?  ?-Examined patient. ?-Continue heel protectors when in bed to avoid pressure injury. ?-Mycotic toenails 1-5 bilaterally were debrided in length and girth with sterile nail nippers and dremel without incident. ?-Patient/POA to call should there be question/concern in the interim.  ? ?Return  in about 3 months (around 12/10/2021). ? ?Marzetta Board, DPM  ?

## 2021-09-28 ENCOUNTER — Ambulatory Visit
Admission: RE | Admit: 2021-09-28 | Discharge: 2021-09-28 | Disposition: A | Discharge status: Home / Self Care | Payer: Medicare Other | Source: Ambulatory Visit | Attending: Nurse Practitioner | Admitting: Nurse Practitioner

## 2021-09-28 ENCOUNTER — Other Ambulatory Visit: Payer: Self-pay | Admitting: Nurse Practitioner

## 2021-09-28 DIAGNOSIS — M545 Low back pain, unspecified: Secondary | ICD-10-CM

## 2021-10-07 ENCOUNTER — Ambulatory Visit: Payer: Medicaid Other | Admitting: Family Medicine

## 2021-10-18 ENCOUNTER — Telehealth: Payer: Self-pay

## 2021-10-18 DIAGNOSIS — E1165 Type 2 diabetes mellitus with hyperglycemia: Secondary | ICD-10-CM

## 2021-10-18 NOTE — Telephone Encounter (Signed)
Patient still having issue with Freestyle Libre readings been different than manually reading.It was 55 on Freestyle and 159 on manually meter. I am not showing a recent update on Freestyle app. Patient states that she is doing 3 units of the [place] per her kidney doctor and  Humalog 6 units at breakfast and lunch and 8 units at supper. Patient is going to try to upload but recent reading for me

## 2021-10-19 ENCOUNTER — Other Ambulatory Visit: Payer: Self-pay

## 2021-10-19 MED ORDER — DEXCOM G7 SENSOR MISC
1.0000 | 3 refills | Status: AC
Start: 2021-10-19 — End: ?

## 2021-10-19 NOTE — Telephone Encounter (Signed)
Patient would like to try the [facility]

## 2021-10-19 NOTE — Telephone Encounter (Signed)
Script put in on parachute to [facility]

## 2021-10-30 ENCOUNTER — Other Ambulatory Visit: Payer: Self-pay | Admitting: Internal Medicine

## 2021-11-02 ENCOUNTER — Ambulatory Visit: Payer: Medicare Other | Admitting: Nutrition

## 2021-11-03 ENCOUNTER — Encounter: Payer: Medicare Other | Attending: Internal Medicine | Admitting: Nutrition

## 2021-11-03 DIAGNOSIS — E118 Type 2 diabetes mellitus with unspecified complications: Secondary | ICD-10-CM | POA: Insufficient documentation

## 2021-11-03 DIAGNOSIS — Z794 Long term (current) use of insulin: Secondary | ICD-10-CM | POA: Insufficient documentation

## 2021-11-03 NOTE — Progress Notes (Signed)
Patient is here with her care giver, to be trained on the use of the Dexcom G7 sensor.  The app was downloaded to her phone, and linked to [place] via the Dexcom clarity app.  Her caregiver was shown how/where to apply the sensor.  This was inserted into her left abdomen, and started via her phone.  They had no final questions.

## 2021-11-03 NOTE — Patient Instructions (Signed)
Change sensor every 10 days Keep sensor 4 inches away from insulin injection sites

## 2021-11-04 ENCOUNTER — Ambulatory Visit: Payer: Medicare Other | Admitting: Internal Medicine

## 2021-11-06 ENCOUNTER — Encounter (HOSPITAL_COMMUNITY): Payer: Self-pay | Admitting: Emergency Medicine

## 2021-11-06 ENCOUNTER — Ambulatory Visit (HOSPITAL_COMMUNITY)
Admission: EM | Admit: 2021-11-06 | Discharge: 2021-11-06 | Disposition: A | Discharge status: Home / Self Care | Payer: Medicare Other | Attending: Internal Medicine | Admitting: Internal Medicine

## 2021-11-06 DIAGNOSIS — M545 Low back pain, unspecified: Secondary | ICD-10-CM

## 2021-11-06 DIAGNOSIS — R350 Frequency of micturition: Secondary | ICD-10-CM

## 2021-11-06 DIAGNOSIS — G8929 Other chronic pain: Secondary | ICD-10-CM | POA: Diagnosis present

## 2021-11-06 DIAGNOSIS — N3090 Cystitis, unspecified without hematuria: Secondary | ICD-10-CM | POA: Diagnosis present

## 2021-11-06 LAB — POCT URINALYSIS DIPSTICK, ED / UC
Bilirubin Urine: NEGATIVE
Glucose, UA: NEGATIVE mg/dL
Ketones, ur: NEGATIVE mg/dL
Nitrite: POSITIVE — AB
Protein, ur: >=300 mg/dL — AB
Specific Gravity, Urine: 1.02 (ref 1.005–1.030)
Urobilinogen, UA: 0.2 mg/dL (ref 0.0–1.0)
pH: 5.5 (ref 5.0–8.0)

## 2021-11-06 MED ORDER — NITROFURANTOIN MONOHYD MACRO 100 MG PO CAPS: 100.0000 mg | ORAL_CAPSULE | Freq: Two times a day (BID) | ORAL | 0 refills | Status: DC

## 2021-11-08 ENCOUNTER — Other Ambulatory Visit: Payer: Self-pay | Admitting: Internal Medicine

## 2021-11-08 LAB — URINE CULTURE: Culture: >=100000 — AB

## 2021-11-09 ENCOUNTER — Encounter: Payer: Self-pay | Admitting: Family Medicine

## 2021-11-09 ENCOUNTER — Ambulatory Visit (INDEPENDENT_AMBULATORY_CARE_PROVIDER_SITE_OTHER): Payer: Medicare Other | Admitting: Family Medicine

## 2021-11-09 ENCOUNTER — Other Ambulatory Visit: Payer: Self-pay | Admitting: Internal Medicine

## 2021-11-09 VITALS — BP 149/81 | HR 64 | Ht 59.0 in | Wt 168.0 lb

## 2021-11-09 DIAGNOSIS — Z9989 Dependence on other enabling machines and devices: Secondary | ICD-10-CM

## 2021-11-09 DIAGNOSIS — G4733 Obstructive sleep apnea (adult) (pediatric): Secondary | ICD-10-CM

## 2021-11-10 ENCOUNTER — Telehealth: Payer: Self-pay

## 2021-11-10 NOTE — Telephone Encounter (Signed)
Patient told to call Dexcom help line.  Telephone number given.  Not sure what the problem could be.

## 2021-11-10 NOTE — Telephone Encounter (Signed)
Patient needs assistance with her Dexcom. Patient states that the sensors is saying there is no signal and she is not sure when she changed it .

## 2021-11-11 ENCOUNTER — Other Ambulatory Visit: Payer: Self-pay | Admitting: Internal Medicine

## 2021-11-17 ENCOUNTER — Telehealth: Payer: Self-pay | Admitting: Family Medicine

## 2021-11-17 DIAGNOSIS — G4733 Obstructive sleep apnea (adult) (pediatric): Secondary | ICD-10-CM

## 2021-11-17 NOTE — Telephone Encounter (Signed)
LVM for pt to call back to schedule  [employer] medicare/mediciad no auth req

## 2021-11-20 ENCOUNTER — Encounter (HOSPITAL_BASED_OUTPATIENT_CLINIC_OR_DEPARTMENT_OTHER): Payer: Self-pay

## 2021-11-20 ENCOUNTER — Emergency Department (HOSPITAL_BASED_OUTPATIENT_CLINIC_OR_DEPARTMENT_OTHER)
Admission: EM | Admit: 2021-11-20 | Discharge: 2021-11-20 | Disposition: A | Discharge status: Home / Self Care | Payer: Medicare Other | Attending: Emergency Medicine | Admitting: Emergency Medicine

## 2021-11-20 ENCOUNTER — Other Ambulatory Visit: Payer: Self-pay

## 2021-11-20 DIAGNOSIS — Z794 Long term (current) use of insulin: Secondary | ICD-10-CM | POA: Diagnosis not present

## 2021-11-20 DIAGNOSIS — R309 Painful micturition, unspecified: Secondary | ICD-10-CM | POA: Diagnosis present

## 2021-11-20 DIAGNOSIS — E119 Type 2 diabetes mellitus without complications: Secondary | ICD-10-CM | POA: Diagnosis not present

## 2021-11-20 DIAGNOSIS — N3000 Acute cystitis without hematuria: Secondary | ICD-10-CM | POA: Diagnosis not present

## 2021-11-20 DIAGNOSIS — Z7982 Long term (current) use of aspirin: Secondary | ICD-10-CM | POA: Insufficient documentation

## 2021-11-20 LAB — URINALYSIS, ROUTINE W REFLEX MICROSCOPIC
Bilirubin Urine: NEGATIVE
Glucose, UA: NEGATIVE mg/dL
Ketones, ur: NEGATIVE mg/dL
Nitrite: POSITIVE — AB
Protein, ur: >300 mg/dL — AB
Specific Gravity, Urine: 1.011 (ref 1.005–1.030)
WBC, UA: >50 WBC/hpf — ABNORMAL HIGH (ref 0–5)
pH: 6 (ref 5.0–8.0)

## 2021-11-20 LAB — CBG MONITORING, ED: Glucose-Capillary: 200 mg/dL — ABNORMAL HIGH (ref 70–99)

## 2021-11-20 MED ORDER — FOSFOMYCIN TROMETHAMINE 3 G PO PACK
3.0000 g | PACK | Freq: Once | ORAL | Status: AC
  Administered 2021-11-20: 3 g via ORAL
  Filled 2021-11-20: qty 3

## 2021-11-20 NOTE — ED Provider Notes (Signed)
[place] EMERGENCY DEPT Provider Note   CSN: [id number] Arrival date & time: 11/20/21  1010     History  Chief Complaint  Patient presents with   Urinary Tract Infection    Amy [name] is a 61 y.o. female.  Is here with painful and smelly urine.  History of UTIs.  History of diabetes on insulin.  Denies any abdominal pain, shortness of breath, back pain, fever, chills.  She is not sure if she is having urinary retention and may be some incontinence issues.  She has home health aide 7 days a week for 6 hours a day.  Uses a bedside commode.  She wears depends.  She feels like she has had the need for frequent urination.  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  Overall she has no abdominal pain or pressure.  She is concerned that she has urinary tract infection.  She just finished a course of antibiotics recently.  Denies any weakness, chills, numbness, back pain.  The history is provided by the patient.       Home Medications Prior to Admission medications   Medication Sig Start Date End Date Taking? Authorizing Provider  albuterol (PROVENTIL HFA;VENTOLIN HFA) 108 (90 BASE) MCG/ACT inhaler Inhale 2 puffs into the lungs every 4 (four) hours as needed for wheezing or shortness of breath. For short    [provider]  allopurinol (ZYLOPRIM) 300 MG tablet Take 300 mg by mouth 2 (two) times daily. 11/23/20   [provider]  aspirin EC 81 MG tablet Take 81 mg by mouth daily.    [provider]  blood glucose meter kit and supplies Dispense based on patient and insurance preference. Use up to four times daily as directed. (FOR ICD-10 E10.9, E11.9). 08/06/20   Wendie Agreste, MD  cetirizine (ZYRTEC) 10 MG tablet Take 10 mg by mouth daily.    [provider]  clotrimazole-betamethasone (LOTRISONE) cream Apply 1 application topically 2 (two) times daily. 07/08/21   McDonald, Stephan Minister, DPM  colchicine 0.6 MG tablet Take by mouth.  12/14/20   [provider]  Continuous Blood Gluc Receiver (FREESTYLE LIBRE 2 READER) DEVI As instructed. iddm with complications - retinopathy, neuropathy, nephropathy. With device and sensors. 08/06/20   Wendie Agreste, MD  Continuous Blood Gluc Sensor (DEXCOM G7 SENSOR) MISC 1 Device by Does not apply route as directed. 10/19/21   Shamleffer, Melanie Crazier, MD  Dulaglutide (TRULICITY) 1.5 HK/7.4QV SOPN Inject 1.5 mg into the skin once a week. 05/06/21   Shamleffer, Melanie Crazier, MD  EMBRACE TALK GLUCOSE TEST test strip USE TO CHECK BLOOD SUGARS FOUR TIMES A DAY BEFORE MEALS AND AT BEDTIME DX E11.29 07/29/20   Wendie Agreste, MD  esomeprazole (NEXIUM) 40 MG capsule Take 1 capsule (40 mg total) by mouth daily. 07/10/20   Bensimhon, Shaune Pascal, MD  gabapentin (NEURONTIN) 300 MG capsule TAKE 1 CAPSULE (300 MG TOTAL) BY MOUTH 3 (THREE) TIMES DAILY. Patient taking differently: Take 400 mg by mouth 3 (three) times daily. 12/21/20   Hilts, Legrand Como, MD  gabapentin (NEURONTIN) 400 MG capsule SMARTSIG:1 Capsule(s) By Mouth 1-4 Times Daily PRN 08/10/21   [provider]  hydrOXYzine (ATARAX/VISTARIL) 25 MG tablet Take 0.5 tablets (12.5 mg total) by mouth every 8 (eight) hours as needed for itching. 12/05/20   Jaynee Eagles, PA-C  insulin lispro (HUMALOG) 100 UNIT/ML KwikPen MAX DAILY 30 UNITS 10/31/21   Shamleffer, Melanie Crazier, MD  Insulin Pen  Needle (PEN NEEDLES) 31G X 8 MM MISC Inject 1 Device into the skin in the morning, at noon, in the evening, and at bedtime. 07/01/20   Shamleffer, Melanie Crazier, MD  levothyroxine (SYNTHROID) 150 MCG tablet Take 1 tablet (150 mcg total) by mouth daily. Needs appt for future refills 08/06/20   Wendie Agreste, MD  linaclotide [facility]) 145 MCG CAPS capsule Take 145 mcg by mouth daily as needed (constipation).     [provider]  naproxen (NAPROSYN) 500 MG tablet Take 500 mg by mouth 2 (two) times daily with a meal.    [provider]   nitrofurantoin, macrocrystal-monohydrate, (MACROBID) 100 MG capsule Take 1 capsule (100 mg total) by mouth 2 (two) times daily. 11/06/21   Nyoka Lint, PA-C  olmesartan (BENICAR) 20 MG tablet Take 10 mg by mouth daily. 04/26/21   [provider]  ondansetron (ZOFRAN ODT) 4 MG disintegrating tablet Take 1 tablet (4 mg total) by mouth every 8 (eight) hours as needed for nausea or vomiting. 07/09/19   Antonietta Breach, PA-C  potassium chloride SA (KLOR-CON) 20 MEQ tablet Take 1 tablet (20 mEq total) by mouth daily. Need appt for future refills 08/06/20   Wendie Agreste, MD  Prodigy Twist Top Lancets 28G MISC USE TO CHECK BLOOD SUGARS 10/02/20   [provider]  simvastatin (ZOCOR) 20 MG tablet Take 1 tablet (20 mg total) by mouth every evening. 08/06/20   Wendie Agreste, MD  spironolactone (ALDACTONE) 25 MG tablet Take 0.5 tablets (12.5 mg total) by mouth daily. Needs appt for future refills 08/06/20   Wendie Agreste, MD  tiZANidine (ZANAFLEX) 2 MG tablet Take 2 mg by mouth 2 (two) times daily. 10/02/20   [provider]  torsemide (DEMADEX) 20 MG tablet Take 4 tablets (80 mg total) by mouth daily. Need appt for future refills 08/06/20   Wendie Agreste, MD  TRESIBA FLEXTOUCH 200 UNIT/ML FlexTouch Pen Inject 12 Units into the skin daily at 6 (six) AM. 11/08/21   Shamleffer, Melanie Crazier, MD      Allergies    Penicillins, Tramadol, Vicodin [hydrocodone-acetaminophen], Codeine, Iodine-131, Iohexol, Lisinopril, and Sulfa antibiotics    Review of Systems   Review of Systems  Physical Exam Updated Vital Signs BP (!) 156/73 (BP Location: Left Arm)   Pulse 64   Temp 98.6 F (37 C) (Oral)   Resp 14   SpO2 100%  Physical Exam Vitals and nursing note reviewed.  Constitutional:      General: She is not in acute distress.    Appearance: She is well-developed.  HENT:     Head: Normocephalic and atraumatic.  Eyes:     Conjunctiva/sclera: Conjunctivae normal.      Pupils: Pupils are equal, round, and reactive to light.  Cardiovascular:     Rate and Rhythm: Normal rate and regular rhythm.     Heart sounds: No murmur heard. Pulmonary:     Effort: Pulmonary effort is normal. No respiratory distress.     Breath sounds: Normal breath sounds.  Abdominal:     Palpations: Abdomen is soft.     Tenderness: There is no abdominal tenderness.  Musculoskeletal:        General: No swelling.     Cervical back: Neck supple.  Skin:    General: Skin is warm and dry.     Capillary Refill: Capillary refill takes less than 2 seconds.  Neurological:     Mental Status: She is alert.  Psychiatric:  Mood and Affect: Mood normal.     ED Results / Procedures / Treatments   Labs (all labs ordered are listed, but only abnormal results are displayed) Labs Reviewed  URINALYSIS, ROUTINE W REFLEX MICROSCOPIC - Abnormal; Notable for the following components:      Result Value   APPearance HAZY (*)    Hgb urine dipstick SMALL (*)    Protein, ur >300 (*)    Nitrite POSITIVE (*)    Leukocytes,Ua MODERATE (*)    WBC, UA >50 (*)    Bacteria, UA MANY (*)    All other components within normal limits  CBG MONITORING, ED - Abnormal; Notable for the following components:   Glucose-Capillary 200 (*)    All other components within normal limits  URINE CULTURE    EKG None  Radiology No results found.  Procedures Procedures    Medications Ordered in ED Medications  fosfomycin (MONUROL) packet 3 g (3 g Oral Given 11/20/21 1107)    ED Course/ Medical Decision Making/ A&P                           Medical Decision Making Amount and/or Complexity of Data Reviewed Labs: ordered.  Risk Prescription drug management.   SHAMIRACLE GORDEN is here with concern for UTI.  Unremarkable vitals.  No fever.  Blood sugars 200.  Well-appearing.  Recently with urinary tract infection and put on Macrobid.  Urinalysis was fairly pan positive.  She does have multiple drug  allergies.  She denies any flank pain, back pain, weakness, numbness, chills, fever.  She states that she has been having a hard time going to the bathroom.  Has a lot of frequency.  Thinks that she is not completely emptying her bladder.  She does have low vision at baseline.  She has home health aides.  She does wear depends and may have some incontinence.  Bladder scan at the bedside did not show greater than 50 cc of urine.  Her belly is soft and overall have very low suspicion for retention and overall suspect some incontinence.  We will do in and out cath.  My suspicion is differential diagnosis is likely bladder spasms versus UTI.  I have no concern for acute intra-abdominal process otherwise.  Urinalysis consistent with infection.  Treated with fosfomycin.  Discharge.  This chart was dictated using voice recognition software.  Despite best efforts to proofread,  errors can occur which can change the documentation meaning.         Final Clinical Impression(s) / ED Diagnoses Final diagnoses:  Acute cystitis without hematuria    Rx / DC Orders ED Discharge Orders     None         Lennice Sites, DO 11/20/21 1126

## 2021-11-20 NOTE — Discharge Instructions (Signed)
You have been treated with a long acting antibiotic.

## 2021-11-22 LAB — URINE CULTURE: Culture: >=100000 — AB

## 2021-11-22 NOTE — Telephone Encounter (Signed)
Order placed for the patient for inlab study

## 2021-11-22 NOTE — Telephone Encounter (Signed)
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 I asked if her if anyone was able to come at night to help her set up the HST and she stated she did not and that her sister is in her 17's and couldn't help at night.   She was wanting to know if she can do a in lab that way the sleep techs would be able to help her out.

## 2021-11-22 NOTE — Addendum Note (Signed)
Addended by: Darleen Crocker on: 11/22/2021 01:49 PM   Modules accepted: Orders

## 2021-11-22 NOTE — Addendum Note (Signed)
Addended by: Wyvonnia Lora on: 11/22/2021 02:07 PM   Modules accepted: Orders

## 2021-11-23 ENCOUNTER — Telehealth: Payer: Self-pay | Admitting: *Deleted

## 2021-11-23 NOTE — Telephone Encounter (Signed)
Noted, thank you I left a voicemail for the patient to call back to schedule.

## 2021-11-23 NOTE — Telephone Encounter (Signed)
Post ED Visit - Positive Culture Follow-up  Culture report reviewed by antimicrobial stewardship pharmacist: [place] Team []  [facility], Pharm.D. []  Heide Guile, Pharm.D., BCPS AQ-ID []  Parks Neptune, Pharm.D., BCPS []  Alycia Rossetti, Pharm.D., BCPS []  [name], [place].D., BCPS, AAHIVP []  Legrand Como, Pharm.D., BCPS, AAHIVP []  Salome Arnt, PharmD, BCPS []  Johnnette Gourd, PharmD, BCPS []  Hughes Better, PharmD, BCPS []  Leeroy Cha, PharmD []  Laqueta Linden, PharmD, BCPS []  Albertina Parr, PharmD  [place] Team []  Leodis Sias, PharmD []  Lindell Spar, PharmD []  Royetta Asal, PharmD []  Graylin Shiver, Rph []  Rema Fendt) Glennon Mac, PharmD []  Arlyn Dunning, PharmD []  Netta Cedars, PharmD []  Dia Sitter, PharmD []  Leone Haven, PharmD []  Gretta Arab, PharmD []  Theodis Shove, PharmD []  Peggyann Juba, PharmD []  Reuel Boom, PharmD   Positive urine culture Treated with Fosfomycin, organism sensitive to the same and no further patient follow-up is required at this time.  Arturo Morton, PharmD  Harlon Flor Talley 11/23/2021, 11:10 AM

## 2021-11-27 ENCOUNTER — Emergency Department (HOSPITAL_BASED_OUTPATIENT_CLINIC_OR_DEPARTMENT_OTHER): Payer: Medicare Other | Admitting: Radiology

## 2021-11-27 ENCOUNTER — Other Ambulatory Visit: Payer: Self-pay

## 2021-11-27 ENCOUNTER — Encounter (HOSPITAL_BASED_OUTPATIENT_CLINIC_OR_DEPARTMENT_OTHER): Payer: Self-pay | Admitting: Emergency Medicine

## 2021-11-27 ENCOUNTER — Emergency Department (HOSPITAL_BASED_OUTPATIENT_CLINIC_OR_DEPARTMENT_OTHER)
Admission: EM | Admit: 2021-11-27 | Discharge: 2021-11-27 | Disposition: A | Discharge status: Home / Self Care | Payer: Medicare Other | Attending: Emergency Medicine | Admitting: Emergency Medicine

## 2021-11-27 DIAGNOSIS — Z794 Long term (current) use of insulin: Secondary | ICD-10-CM | POA: Insufficient documentation

## 2021-11-27 DIAGNOSIS — I10 Essential (primary) hypertension: Secondary | ICD-10-CM | POA: Diagnosis not present

## 2021-11-27 DIAGNOSIS — E1140 Type 2 diabetes mellitus with diabetic neuropathy, unspecified: Secondary | ICD-10-CM | POA: Insufficient documentation

## 2021-11-27 DIAGNOSIS — M79661 Pain in right lower leg: Secondary | ICD-10-CM | POA: Insufficient documentation

## 2021-11-27 DIAGNOSIS — M79604 Pain in right leg: Secondary | ICD-10-CM | POA: Diagnosis present

## 2021-11-27 DIAGNOSIS — Z7982 Long term (current) use of aspirin: Secondary | ICD-10-CM | POA: Insufficient documentation

## 2021-11-27 DIAGNOSIS — W01198A Fall on same level from slipping, tripping and stumbling with subsequent striking against other object, initial encounter: Secondary | ICD-10-CM | POA: Insufficient documentation

## 2021-11-27 DIAGNOSIS — Y92815 Train as the place of occurrence of the external cause: Secondary | ICD-10-CM | POA: Insufficient documentation

## 2021-11-27 DIAGNOSIS — N390 Urinary tract infection, site not specified: Secondary | ICD-10-CM | POA: Diagnosis not present

## 2021-11-27 DIAGNOSIS — W19XXXA Unspecified fall, initial encounter: Secondary | ICD-10-CM

## 2021-11-27 LAB — URINALYSIS, ROUTINE W REFLEX MICROSCOPIC
Bilirubin Urine: NEGATIVE
Glucose, UA: >1000 mg/dL — AB
Ketones, ur: NEGATIVE mg/dL
Nitrite: NEGATIVE
Protein, ur: 100 mg/dL — AB
RBC / HPF: >50 RBC/hpf — ABNORMAL HIGH (ref 0–5)
Specific Gravity, Urine: 1.013 (ref 1.005–1.030)
WBC, UA: >50 WBC/hpf — ABNORMAL HIGH (ref 0–5)
pH: 7 (ref 5.0–8.0)

## 2021-11-27 MED ORDER — CEPHALEXIN 500 MG PO CAPS: 500.0000 mg | ORAL_CAPSULE | Freq: Three times a day (TID) | ORAL | 0 refills | Status: AC

## 2021-11-27 MED ORDER — CEFTRIAXONE SODIUM 1 G IJ SOLR
1.0000 g | Freq: Once | INTRAMUSCULAR | Status: AC
  Administered 2021-11-27: 1 g via INTRAMUSCULAR
  Filled 2021-11-27: qty 10

## 2021-11-27 NOTE — ED Provider Notes (Signed)
[facility] EMERGENCY DEPT Provider Note   CSN: [id number] Arrival date & time: 11/27/21  1429     History  Chief Complaint  Patient presents with   Amy [name] is a 61 y.o. female.  Amy [name] is a 61 y.o. female with a history of hypertension, diabetes, diabetic neuropathy, and blindness, who presents to the emergency department for evaluation of right leg pain.  Patient reports that about a week ago she was getting off a train and had to go down steps, patient reports that she has difficulty getting around and tripped getting off the steps striking her shin on the ground.  She reports that someone initially checked it out, but she did not have any x-rays or imaging.  She reports that several days later she still has some soreness primarily to the anterior shin just below the knee so came in to get this checked.  She also reports that she was recently treated with fosfomycin for a UTI and initially her symptoms seem to be improving, but yesterday she started having some suprapubic discomfort and urinary frequency and feels like her symptoms are coming back.  Denies any fevers, vomiting or flank pain.  No other aggravating or alleviating factors.  The history is provided by the patient, a relative and medical records.  Fall Pertinent negatives include no chest pain, no abdominal pain and no shortness of breath.       Home Medications Prior to Admission medications   Medication Sig Start Date End Date Taking? Authorizing Provider  cephALEXin (KEFLEX) 500 MG capsule Take 1 capsule (500 mg total) by mouth 3 (three) times daily for 7 days. 11/27/21 12/04/21 Yes Jacqlyn Larsen, PA-C  albuterol (PROVENTIL HFA;VENTOLIN HFA) 108 (90 BASE) MCG/ACT inhaler Inhale 2 puffs into the lungs every 4 (four) hours as needed for wheezing or shortness of breath. For short    [provider]  allopurinol (ZYLOPRIM) 300 MG tablet Take 300 mg by mouth 2 (two) times daily.  11/23/20   [provider]  aspirin EC 81 MG tablet Take 81 mg by mouth daily.    [provider]  blood glucose meter kit and supplies Dispense based on patient and insurance preference. Use up to four times daily as directed. (FOR ICD-10 E10.9, E11.9). 08/06/20   Wendie Agreste, MD  cetirizine (ZYRTEC) 10 MG tablet Take 10 mg by mouth daily.    [provider]  clotrimazole-betamethasone (LOTRISONE) cream Apply 1 application topically 2 (two) times daily. 07/08/21   McDonald, Stephan Minister, DPM  colchicine 0.6 MG tablet Take by mouth. 12/14/20   [provider]  Continuous Blood Gluc Receiver (FREESTYLE LIBRE 2 READER) DEVI As instructed. iddm with complications - retinopathy, neuropathy, nephropathy. With device and sensors. 08/06/20   Wendie Agreste, MD  Continuous Blood Gluc Sensor (DEXCOM G7 SENSOR) MISC 1 Device by Does not apply route as directed. 10/19/21   Shamleffer, Melanie Crazier, MD  Dulaglutide (TRULICITY) 1.5 AT/5.5DD SOPN Inject 1.5 mg into the skin once a week. 05/06/21   Shamleffer, Melanie Crazier, MD  EMBRACE TALK GLUCOSE TEST test strip USE TO CHECK BLOOD SUGARS FOUR TIMES A DAY BEFORE MEALS AND AT BEDTIME DX E11.29 07/29/20   Wendie Agreste, MD  esomeprazole (NEXIUM) 40 MG capsule Take 1 capsule (40 mg total) by mouth daily. 07/10/20   Bensimhon, Shaune Pascal, MD  gabapentin (NEURONTIN) 300 MG capsule TAKE 1 CAPSULE (300 MG TOTAL) BY MOUTH 3 (  THREE) TIMES DAILY. Patient taking differently: Take 400 mg by mouth 3 (three) times daily. 12/21/20   Hilts, Legrand Como, MD  gabapentin (NEURONTIN) 400 MG capsule SMARTSIG:1 Capsule(s) By Mouth 1-4 Times Daily PRN 08/10/21   [provider]  hydrOXYzine (ATARAX/VISTARIL) 25 MG tablet Take 0.5 tablets (12.5 mg total) by mouth every 8 (eight) hours as needed for itching. 12/05/20   Jaynee Eagles, PA-C  insulin lispro (HUMALOG) 100 UNIT/ML KwikPen MAX DAILY 30 UNITS 10/31/21   Shamleffer, Melanie Crazier, MD  Insulin  Pen Needle (PEN NEEDLES) 31G X 8 MM MISC Inject 1 Device into the skin in the morning, at noon, in the evening, and at bedtime. 07/01/20   Shamleffer, Melanie Crazier, MD  levothyroxine (SYNTHROID) 150 MCG tablet Take 1 tablet (150 mcg total) by mouth daily. Needs appt for future refills 08/06/20   Wendie Agreste, MD  linaclotide [facility]) 145 MCG CAPS capsule Take 145 mcg by mouth daily as needed (constipation).     [provider]  naproxen (NAPROSYN) 500 MG tablet Take 500 mg by mouth 2 (two) times daily with a meal.    [provider]  nitrofurantoin, macrocrystal-monohydrate, (MACROBID) 100 MG capsule Take 1 capsule (100 mg total) by mouth 2 (two) times daily. 11/06/21   Nyoka Lint, PA-C  olmesartan (BENICAR) 20 MG tablet Take 10 mg by mouth daily. 04/26/21   [provider]  ondansetron (ZOFRAN ODT) 4 MG disintegrating tablet Take 1 tablet (4 mg total) by mouth every 8 (eight) hours as needed for nausea or vomiting. 07/09/19   Antonietta Breach, PA-C  potassium chloride SA (KLOR-CON) 20 MEQ tablet Take 1 tablet (20 mEq total) by mouth daily. Need appt for future refills 08/06/20   Wendie Agreste, MD  Prodigy Twist Top Lancets 28G MISC USE TO CHECK BLOOD SUGARS 10/02/20   [provider]  simvastatin (ZOCOR) 20 MG tablet Take 1 tablet (20 mg total) by mouth every evening. 08/06/20   Wendie Agreste, MD  spironolactone (ALDACTONE) 25 MG tablet Take 0.5 tablets (12.5 mg total) by mouth daily. Needs appt for future refills 08/06/20   Wendie Agreste, MD  tiZANidine (ZANAFLEX) 2 MG tablet Take 2 mg by mouth 2 (two) times daily. 10/02/20   [provider]  torsemide (DEMADEX) 20 MG tablet Take 4 tablets (80 mg total) by mouth daily. Need appt for future refills 08/06/20   Wendie Agreste, MD  TRESIBA FLEXTOUCH 200 UNIT/ML FlexTouch Pen Inject 12 Units into the skin daily at 6 (six) AM. 11/08/21   Shamleffer, Melanie Crazier, MD      Allergies     Penicillins, Tramadol, Vicodin [hydrocodone-acetaminophen], Codeine, Iodine-131, Iohexol, Lisinopril, and Sulfa antibiotics    Review of Systems   Review of Systems  Constitutional:  Negative for chills and fever.  Respiratory:  Negative for shortness of breath.   Cardiovascular:  Negative for chest pain.  Gastrointestinal:  Negative for abdominal pain.  Genitourinary:  Positive for dysuria and frequency. Negative for flank pain and hematuria.  Musculoskeletal:  Negative for joint swelling.       Right shin pain  Skin:  Negative for color change and rash.    Physical Exam Updated Vital Signs BP 131/74 (BP Location: Right Arm)   Pulse 66   Temp 98.3 F (36.8 C) (Oral)   Resp 16   SpO2 100%  Physical Exam Vitals and nursing note reviewed.  Constitutional:      General: She is not in acute distress.  Appearance: Normal appearance. She is well-developed. She is not ill-appearing or diaphoretic.  HENT:     Head: Normocephalic and atraumatic.  Eyes:     General:        Right eye: No discharge.        Left eye: No discharge.  Pulmonary:     Effort: Pulmonary effort is normal. No respiratory distress.  Abdominal:     General: Bowel sounds are normal. There is no distension.     Palpations: Abdomen is soft. There is no mass.     Tenderness: There is no abdominal tenderness. There is no right CVA tenderness, left CVA tenderness or guarding.  Musculoskeletal:     Comments: There is mild tenderness on palpation to the anterior right shin just distal to the knee without palpable bony deformity or overlying skin changes.  No bony tenderness over the knee or ankle.  2+ DP and PT pulses, normal sensation and strength.  Neurological:     Mental Status: She is alert and oriented to person, place, and time.     Coordination: Coordination normal.  Psychiatric:        Mood and Affect: Mood normal.        Behavior: Behavior normal.     ED Results / Procedures / Treatments    Labs (all labs ordered are listed, but only abnormal results are displayed) Labs Reviewed  URINALYSIS, ROUTINE W REFLEX MICROSCOPIC - Abnormal; Notable for the following components:      Result Value   APPearance CLOUDY (*)    Glucose, UA >1,000 (*)    Hgb urine dipstick MODERATE (*)    Protein, ur 100 (*)    Leukocytes,Ua LARGE (*)    RBC / HPF >50 (*)    WBC, UA >50 (*)    Bacteria, UA MANY (*)    All other components within normal limits  URINE CULTURE    EKG None  Radiology DG Tibia/Fibula Right  Result Date: 11/27/2021 CLINICAL DATA:  Pain.  Fall with right lower extremity pain EXAM: RIGHT TIBIA AND FIBULA - 2 VIEW COMPARISON:  None Available. FINDINGS: Cortical margins of the tibia and fibula are intact. There is no evidence of fracture or other focal bone lesions. Knee and ankle alignment are maintained. Mild generalized soft tissue edema. No soft tissue gas. No radiopaque foreign body IMPRESSION: Soft tissue edema. No fracture. Electronically Signed   By: Keith Rake M.D.   On: 11/27/2021 18:19    Procedures Procedures    Medications Ordered in ED Medications  cefTRIAXone (ROCEPHIN) injection 1 g (1 g Intramuscular Given 11/27/21 2050)    ED Course/ Medical Decision Making/ A&P                           Medical Decision Making Amount and/or Complexity of Data Reviewed Labs: ordered. Radiology: ordered.   61 y.o. female presents to the ED with complaints of right leg pain and urinary symptoms, this involves an extensive number of treatment options, and is a complaint that carries with it a high risk of complications and morbidity.  The differential diagnosis includes fracture, soft tissue injury, UTI, pyelonephritis  On arrival pt is nontoxic, vitals WNL.   Additional history obtained from sister at bedside. Previous records obtained and reviewed including recent ED visit for UTI and culture results which showed pansensitive E. coli.    Lab Tests:  I  Ordered, reviewed, and interpreted labs, which included: UA  continues to show signs of infection, will send for repeat culture and treat with antibiotics  Imaging Studies ordered:  I ordered imaging studies which included right tib-fib x-ray, I independently visualized and interpreted imaging which showed no fracture or other acute abnormality some mild soft tissue edema noted  ED Course:   Provided patient with reassurance x-ray with no evidence of fracture after leg injury week ago, encourage continued supportive care, no overlying wounds or skin breakdown and the leg is neurovascularly intact.  UA does show signs of urinary tract infection.  Patient without any signs of sepsis and she is well-appearing and tolerating p.o. fluids.  Will give IM Rocephin and then prescribe patient course of Keflex for UTI and have her follow-up with PCP if symptoms not improving.  Return precautions discussed.  Discharged home in good condition.    Portions of this note were generated with Lobby[employer]. Dictation errors may occur despite best attempts at proofreading.         Final Clinical Impression(s) / ED Diagnoses Final diagnoses:  Fall, initial encounter  Pain of right lower leg  Acute UTI    Rx / DC Orders ED Discharge Orders          Ordered    cephALEXin (KEFLEX) 500 MG capsule  3 times daily        11/27/21 2105              Jacqlyn Larsen, PA-C 11/27/21 2210    Lennice Sites, DO 11/27/21 2333

## 2021-11-27 NOTE — ED Triage Notes (Signed)
Pt also complains of burning with urination and would like to get that checked as well.

## 2021-11-27 NOTE — ED Triage Notes (Signed)
Pt slipped on fell on her right knee going down the steps on a train. States her right shin and knee are hurting.

## 2021-11-27 NOTE — Discharge Instructions (Addendum)
Your x-ray did not show any evidence of fracture from your fall.  Your urine does show signs of infection.  Please take antibiotics 3 times daily for the next 7 days and follow-up with your primary care provider if urinary symptoms or not improving. If you develop fever, chills, flank pain or vomiting return for reevaluation.

## 2021-11-30 LAB — URINE CULTURE: Culture: >=100000 — AB

## 2021-12-01 ENCOUNTER — Telehealth: Payer: Self-pay | Admitting: *Deleted

## 2021-12-01 NOTE — Telephone Encounter (Signed)
Post ED Visit - Positive Culture Follow-up  Culture report reviewed by antimicrobial stewardship pharmacist: [facility] Team []  Elenor Quinones, Pharm.D. []  Heide Guile, Pharm.D., BCPS AQ-ID []  Parks Neptune, Pharm.D., BCPS []  Alycia Rossetti, Pharm.D., BCPS []  [place], Pharm.D., BCPS, AAHIVP []  Legrand Como, Pharm.D., BCPS, AAHIVP [x]  Salome Arnt, PharmD, BCPS []  Johnnette Gourd, PharmD, BCPS []  Hughes Better, PharmD, BCPS []  Leeroy Cha, PharmD []  Laqueta Linden, PharmD, BCPS []  Albertina Parr, PharmD  [place] Team []  Leodis Sias, PharmD []  Lindell Spar, PharmD []  Royetta Asal, PharmD []  Graylin Shiver, Rph []  Rema Fendt) Glennon Mac, PharmD []  Arlyn Dunning, PharmD []  Netta Cedars, PharmD []  Dia Sitter, PharmD []  Leone Haven, PharmD []  Gretta Arab, PharmD []  Theodis Shove, PharmD []  Peggyann Juba, PharmD []  Reuel Boom, PharmD   Positive urine culture Treated with Cephalexin, organism sensitive to the same and no further patient follow-up is required at this time.  Harlon Flor [facility] 12/01/2021, 9:14 AM

## 2021-12-08 NOTE — Telephone Encounter (Signed)
Patient was scheduled for 12/16/21 but she is not able to make that one because she cannot find anyone to bring her that day.  She is r/s for 12/21/21 at 9 pm.   I mailed packet to the patient.

## 2021-12-09 ENCOUNTER — Ambulatory Visit
Admission: RE | Admit: 2021-12-09 | Discharge: 2021-12-09 | Disposition: A | Discharge status: Home / Self Care | Payer: Medicare Other | Source: Ambulatory Visit | Attending: Family Medicine | Admitting: Family Medicine

## 2021-12-09 ENCOUNTER — Other Ambulatory Visit: Payer: Self-pay | Admitting: Family Medicine

## 2021-12-09 DIAGNOSIS — M542 Cervicalgia: Secondary | ICD-10-CM

## 2021-12-10 ENCOUNTER — Emergency Department (HOSPITAL_COMMUNITY): Payer: Medicare Other

## 2021-12-10 ENCOUNTER — Emergency Department (HOSPITAL_COMMUNITY)
Admission: EM | Admit: 2021-12-10 | Discharge: 2021-12-10 | Disposition: A | Discharge status: Home / Self Care | Payer: Medicare Other | Attending: Emergency Medicine | Admitting: Emergency Medicine

## 2021-12-10 ENCOUNTER — Other Ambulatory Visit: Payer: Self-pay

## 2021-12-10 ENCOUNTER — Encounter (HOSPITAL_COMMUNITY): Payer: Self-pay | Admitting: Emergency Medicine

## 2021-12-10 DIAGNOSIS — Z7982 Long term (current) use of aspirin: Secondary | ICD-10-CM | POA: Diagnosis not present

## 2021-12-10 DIAGNOSIS — M542 Cervicalgia: Secondary | ICD-10-CM | POA: Diagnosis present

## 2021-12-10 LAB — CBC
HCT: 37.2 % (ref 36.0–46.0)
Hemoglobin: 11.9 g/dL — ABNORMAL LOW (ref 12.0–15.0)
MCH: 27.1 pg (ref 26.0–34.0)
MCHC: 32 g/dL (ref 30.0–36.0)
MCV: 84.7 fL (ref 80.0–100.0)
Platelets: 209 10*3/uL (ref 150–400)
RBC: 4.39 MIL/uL (ref 3.87–5.11)
RDW: 14.7 % (ref 11.5–15.5)
WBC: 8.2 10*3/uL (ref 4.0–10.5)
nRBC: 0 % (ref 0.0–0.2)

## 2021-12-10 LAB — BASIC METABOLIC PANEL
Anion gap: 9 (ref 5–15)
BUN: 19 mg/dL (ref 8–23)
CO2: 24 mmol/L (ref 22–32)
Calcium: 8.9 mg/dL (ref 8.9–10.3)
Chloride: 111 mmol/L (ref 98–111)
Creatinine, Ser: 1.27 mg/dL — ABNORMAL HIGH (ref 0.44–1.00)
GFR, Estimated: 48 mL/min — ABNORMAL LOW (ref >60)
Glucose, Bld: 113 mg/dL — ABNORMAL HIGH (ref 70–99)
Potassium: 4.3 mmol/L (ref 3.5–5.1)
Sodium: 144 mmol/L (ref 135–145)
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
## 2021-12-10 NOTE — ED Notes (Signed)
Dr. Randal Buba rounding at bedside, C-collar removed at this time

## 2021-12-10 NOTE — ED Notes (Signed)
Patient verbalizes understanding of discharge instructions. Opportunity for questioning and answers were provided. Armband removed by staff, pt discharged from ED. Wheeled out to lobby with family  
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
## 2021-12-10 NOTE — ED Provider Notes (Signed)
[facility] EMERGENCY DEPARTMENT Provider Note   CSN: [id number] Arrival date & time: 12/10/21  1904     History  Chief Complaint  Patient presents with   Neck Injury    Amy [name] is a 61 y.o. female.  The history is provided by the patient.  Illness Location:  At doctors office Quality:  Told she had a neck fracture Severity:  Moderate Onset quality:  Gradual Duration:  1 day Timing:  Constant Progression:  Unchanged Chronicity:  New Context:  Sent in for abnormal plain films Relieved by:  Nothing Worsened by:  Nothing Ineffective treatments:  None Associated symptoms: no fever and no wheezing   Associated symptoms comment:  No weakness, no numbness       Home Medications Prior to Admission medications   Medication Sig Start Date End Date Taking? Authorizing Provider  albuterol (PROVENTIL HFA;VENTOLIN HFA) 108 (90 BASE) MCG/ACT inhaler Inhale 2 puffs into the lungs every 4 (four) hours as needed for wheezing or shortness of breath. For short    [provider]  allopurinol (ZYLOPRIM) 300 MG tablet Take 300 mg by mouth 2 (two) times daily. 11/23/20   [provider]  aspirin EC 81 MG tablet Take 81 mg by mouth daily.    [provider]  blood glucose meter kit and supplies Dispense based on patient and insurance preference. Use up to four times daily as directed. (FOR ICD-10 E10.9, E11.9). 08/06/20   Wendie Agreste, MD  cetirizine (ZYRTEC) 10 MG tablet Take 10 mg by mouth daily.    [provider]  clotrimazole-betamethasone (LOTRISONE) cream Apply 1 application topically 2 (two) times daily. 07/08/21   McDonald, Stephan Minister, DPM  colchicine 0.6 MG tablet Take by mouth. 12/14/20   [provider]  Continuous Blood Gluc Receiver (FREESTYLE LIBRE 2 READER) DEVI As instructed. iddm with complications - retinopathy, neuropathy, nephropathy. With device and sensors. 08/06/20   Wendie Agreste, MD  Continuous  Blood Gluc Sensor (DEXCOM G7 SENSOR) MISC 1 Device by Does not apply route as directed. 10/19/21   Shamleffer, Melanie Crazier, MD  Dulaglutide (TRULICITY) 1.5 XB/2.8UX SOPN Inject 1.5 mg into the skin once a week. 05/06/21   Shamleffer, Melanie Crazier, MD  EMBRACE TALK GLUCOSE TEST test strip USE TO CHECK BLOOD SUGARS FOUR TIMES A DAY BEFORE MEALS AND AT BEDTIME DX E11.29 07/29/20   Wendie Agreste, MD  esomeprazole (NEXIUM) 40 MG capsule Take 1 capsule (40 mg total) by mouth daily. 07/10/20   Bensimhon, Shaune Pascal, MD  gabapentin (NEURONTIN) 300 MG capsule TAKE 1 CAPSULE (300 MG TOTAL) BY MOUTH 3 (THREE) TIMES DAILY. Patient taking differently: Take 400 mg by mouth 3 (three) times daily. 12/21/20   Hilts, Legrand Como, MD  gabapentin (NEURONTIN) 400 MG capsule SMARTSIG:1 Capsule(s) By Mouth 1-4 Times Daily PRN 08/10/21   [provider]  hydrOXYzine (ATARAX/VISTARIL) 25 MG tablet Take 0.5 tablets (12.5 mg total) by mouth every 8 (eight) hours as needed for itching. 12/05/20   Jaynee Eagles, PA-C  insulin lispro (HUMALOG) 100 UNIT/ML KwikPen MAX DAILY 30 UNITS 10/31/21   Shamleffer, Melanie Crazier, MD  Insulin Pen Needle (PEN NEEDLES) 31G X 8 MM MISC Inject 1 Device into the skin in the morning, at noon, in the evening, and at bedtime. 07/01/20   Shamleffer, Melanie Crazier, MD  levothyroxine (SYNTHROID) 150 MCG tablet Take 1 tablet (150 mcg total) by mouth daily. Needs appt for future refills 08/06/20   Merri Ray  R, MD  linaclotide (LINZESS) 145 MCG CAPS capsule Take 145 mcg by mouth daily as needed (constipation).     [provider]  naproxen (NAPROSYN) 500 MG tablet Take 500 mg by mouth 2 (two) times daily with a meal.    [provider]  nitrofurantoin, macrocrystal-monohydrate, (MACROBID) 100 MG capsule Take 1 capsule (100 mg total) by mouth 2 (two) times daily. 11/06/21   Nyoka Lint, PA-C  olmesartan (BENICAR) 20 MG tablet Take 10 mg by mouth daily. 04/26/21   [provider]  ondansetron (ZOFRAN ODT) 4 MG disintegrating tablet Take 1 tablet (4 mg total) by mouth every 8 (eight) hours as needed for nausea or vomiting. 07/09/19   Antonietta Breach, PA-C  potassium chloride SA (KLOR-CON) 20 MEQ tablet Take 1 tablet (20 mEq total) by mouth daily. Need appt for future refills 08/06/20   Wendie Agreste, MD  Prodigy Twist Top Lancets 28G MISC USE TO CHECK BLOOD SUGARS 10/02/20   [provider]  simvastatin (ZOCOR) 20 MG tablet Take 1 tablet (20 mg total) by mouth every evening. 08/06/20   Wendie Agreste, MD  spironolactone (ALDACTONE) 25 MG tablet Take 0.5 tablets (12.5 mg total) by mouth daily. Needs appt for future refills 08/06/20   Wendie Agreste, MD  tiZANidine (ZANAFLEX) 2 MG tablet Take 2 mg by mouth 2 (two) times daily. 10/02/20   [provider]  torsemide (DEMADEX) 20 MG tablet Take 4 tablets (80 mg total) by mouth daily. Need appt for future refills 08/06/20   Wendie Agreste, MD  TRESIBA FLEXTOUCH 200 UNIT/ML FlexTouch Pen Inject 12 Units into the skin daily at 6 (six) AM. 11/08/21   Shamleffer, Melanie Crazier, MD      Allergies    Penicillins, Tramadol, Vicodin [hydrocodone-acetaminophen], Codeine, Iodine-131, Iohexol, Lisinopril, and Sulfa antibiotics    Review of Systems   Review of Systems  Constitutional:  Negative for fever.  HENT:  Negative for facial swelling.   Eyes:  Negative for redness.  Respiratory:  Negative for wheezing and stridor.   All other systems reviewed and are negative.   Physical Exam Updated Vital Signs BP (!) 146/70   Pulse 64   Temp 98.1 F (36.7 C) (Oral)   Resp 18   SpO2 100%  Physical Exam Vitals and nursing note reviewed.  Constitutional:      General: She is not in acute distress.    Appearance: She is well-developed.  HENT:     Head: Normocephalic and atraumatic.     Nose: Nose normal.  Eyes:     Pupils: Pupils are equal, round, and reactive to light.  Cardiovascular:      Rate and Rhythm: Normal rate and regular rhythm.  Pulmonary:     Effort: No respiratory distress.     Breath sounds: Normal breath sounds.  Abdominal:     General: Bowel sounds are normal. There is no distension.     Palpations: Abdomen is soft.     Tenderness: There is no abdominal tenderness. There is no guarding or rebound.  Genitourinary:    Vagina: No vaginal discharge.  Musculoskeletal:        General: Normal range of motion.     Cervical back: Normal, normal range of motion and neck supple. No swelling, edema, deformity, rigidity, spasms, torticollis, tenderness, bony tenderness or crepitus. No pain with movement. Normal range of motion.     Thoracic back: Normal.     Lumbar back: Normal.  Lymphadenopathy:  Cervical: No cervical adenopathy.  Skin:    General: Skin is warm and dry.     Capillary Refill: Capillary refill takes less than 2 seconds.     Findings: No erythema or rash.  Neurological:     General: No focal deficit present.     Mental Status: She is oriented to person, place, and time.  Psychiatric:        Mood and Affect: Mood normal.        Behavior: Behavior normal.     ED Results / Procedures / Treatments   Labs (all labs ordered are listed, but only abnormal results are displayed) Labs Reviewed  CBC - Abnormal; Notable for the following components:      Result Value   Hemoglobin 11.9 (*)    All other components within normal limits  BASIC METABOLIC PANEL - Abnormal; Notable for the following components:   Glucose, Bld 113 (*)    Creatinine, Ser 1.27 (*)    GFR, Estimated 48 (*)    All other components within normal limits    EKG None  Radiology CT Cervical Spine Wo Contrast  Result Date: 12/10/2021 CLINICAL DATA:  Neck trauma. EXAM: CT CERVICAL SPINE WITHOUT CONTRAST TECHNIQUE: Multidetector CT imaging of the cervical spine was performed without intravenous contrast. Multiplanar CT image reconstructions were also generated. RADIATION DOSE  REDUCTION: This exam was performed according to the departmental dose-optimization program which includes automated exposure control, adjustment of the mA and/or kV according to patient size and/or use of iterative reconstruction technique. COMPARISON:  Cervical spine x-ray 12/09/2021. MRI cervical spine 04/29/2021 FINDINGS: Alignment: Normal. Skull base and vertebrae: No acute fracture. No primary bone lesion or focal pathologic process. Soft tissues and spinal canal: No prevertebral fluid or swelling. No visible canal hematoma. Disc levels: There is moderate disc space narrowing at all levels. Anterior osteophytes are seen throughout which appear large at C2, C3, C4 and C5. There is ossification of the posterior longitudinal ligament at C2-C3, C4-C5, C5-C6 and C6-C7. C1-C2: No central canal stenosis. C2-C3: Ossification of posterior longitudinal ligament and central calcified disc causes mild central canal stenosis similar to the prior study. C3-C4: Bilateral uncovertebral spurring causes moderate bilateral neural foraminal stenosis. No central canal stenosis. C4-C5: Bilateral uncovertebral spurring and ossification of posterior longitudinal ligament. Moderate severe right neural foraminal stenosis. Mild central canal stenosis. C5-C6: Uncovertebral spurring and ossification of posterior longitudinal ligament. Mild left neural foraminal stenosis. Mild central canal stenosis. C6-C7: No central canal or neural foraminal stenosis. Upper chest: Negative. Other: None. IMPRESSION: 1. No acute fracture or traumatic subluxation of the cervical spine. 2. 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 Electronically Signed   By: Ronney Asters M.D.   On: 12/10/2021 21:26   DG Cervical Spine 2 or 3 views  Addendum Date: 12/10/2021   ADDENDUM REPORT: 12/10/2021 17:01 ADDENDUM: These results were called by telephone at the time of interpretation on 12/10/2021 at  4:56 pm to provider Nurse, Joslyn Devon, who verbally acknowledged these results. Electronically Signed   By: Ronney Asters M.D.   On: 12/10/2021 17:01   Result Date: 12/10/2021 CLINICAL DATA:  Neck pain. EXAM: CERVICAL SPINE - 2-3 VIEW COMPARISON:  Cervical spine x-ray 12/13/2020 FINDINGS: There is a nondisplaced transverse fracture through the base of the dens which appears new from the prior exam. Cervical alignment appears normal. Large anterior bridging osteophytes are seen from C2 through C5 which appear unchanged. There is no prevertebral soft tissue  swelling. There is mild disc space narrowing throughout the cervical spine. IMPRESSION: 1. Nondisplaced fracture through the base of the dens. 2. Alignment appears anatomic. 3. Otherwise stable multilevel degenerative changes. Electronically Signed: By: Ronney Asters M.D. On: 12/10/2021 16:40    Procedures Procedures    Medications Ordered in ED Medications - No data to display  ED Course/ Medical Decision Making/ A&P                           Medical Decision Making Patient here for reported fracture on plain AXrays   Amount and/or Complexity of Data Reviewed External Data Reviewed: notes.    Details: previous notes reviewed Labs: ordered.    Details: normal white count, hemoglobin low at 11.9 and normal platelets.  Normal sodium and potassium elevated creatinine 1.27 Radiology: ordered and independent interpretation performed.    Details: I have reviewed CT cerival spine.  No acute fracture Discussion of management or test interpretation with external provider(s): Case d/w Dr. Garey Ham, I asked the radiologist to re-review the CT cervical spine to ensure there is no fracture.  She has reviewed the images and no fracture at the dens or elsewhere in Cspine   Risk Risk Details: No fracture of the C spine on CT Cervical spine.  Stable for discharge with close follow up.  Strict return precautions.      Final Clinical Impression(s) / ED  Diagnoses Final diagnoses:  None     Return for intractable cough, coughing up blood, fevers > 100.4 unrelieved by medication, shortness of breath, intractable vomiting, chest pain, shortness of breath, weakness, numbness, changes in speech, facial asymmetry, abdominal pain, passing out, Inability to tolerate liquids or food, cough, altered mental status or any concerns. No signs of systemic illness or infection. The patient is nontoxic-appearing on exam and vital signs are within normal limits.  I have reviewed the triage vital signs and the nursing notes. Pertinent labs & imaging results that were available during my care of the patient were reviewed by me and considered in my medical decision making (see chart for details). After history, exam, and medical workup I feel the patient has been appropriately medically screened and is safe for discharge home. Pertinent diagnoses were discussed with the patient. Patient was given return precautions.  Rx / DC Orders ED Discharge Orders     None         Kitai Purdom, MD 12/10/21 2332

## 2021-12-15 ENCOUNTER — Ambulatory Visit
Admission: RE | Admit: 2021-12-15 | Discharge: 2021-12-15 | Disposition: A | Discharge status: Home / Self Care | Payer: Medicare Other | Source: Ambulatory Visit | Attending: Family Medicine | Admitting: Family Medicine

## 2021-12-15 DIAGNOSIS — N6310 Unspecified lump in the right breast, unspecified quadrant: Secondary | ICD-10-CM

## 2021-12-17 ENCOUNTER — Ambulatory Visit: Payer: Medicare Other | Admitting: Podiatry

## 2021-12-20 ENCOUNTER — Ambulatory Visit (INDEPENDENT_AMBULATORY_CARE_PROVIDER_SITE_OTHER): Payer: Medicare Other | Admitting: Podiatry

## 2021-12-20 ENCOUNTER — Encounter: Payer: Self-pay | Admitting: Podiatry

## 2021-12-20 DIAGNOSIS — E1142 Type 2 diabetes mellitus with diabetic polyneuropathy: Secondary | ICD-10-CM | POA: Diagnosis not present

## 2021-12-20 DIAGNOSIS — M79674 Pain in right toe(s): Secondary | ICD-10-CM | POA: Diagnosis not present

## 2021-12-20 DIAGNOSIS — M79675 Pain in left toe(s): Secondary | ICD-10-CM | POA: Diagnosis not present

## 2021-12-20 DIAGNOSIS — L84 Corns and callosities: Secondary | ICD-10-CM

## 2021-12-20 DIAGNOSIS — B351 Tinea unguium: Secondary | ICD-10-CM

## 2021-12-21 ENCOUNTER — Ambulatory Visit (INDEPENDENT_AMBULATORY_CARE_PROVIDER_SITE_OTHER): Payer: Medicare Other | Admitting: Neurology

## 2021-12-21 DIAGNOSIS — G4720 Circadian rhythm sleep disorder, unspecified type: Secondary | ICD-10-CM

## 2021-12-21 DIAGNOSIS — G4733 Obstructive sleep apnea (adult) (pediatric): Secondary | ICD-10-CM

## 2021-12-23 ENCOUNTER — Ambulatory Visit: Payer: Medicaid Other | Admitting: Family Medicine

## 2021-12-26 IMAGING — CT CT RENAL STONE PROTOCOL
2 of 4 series · 17 of 46 positions shown, 19 images · non-contrast
Comparison: 03/06/2018

CLINICAL DATA: Right upper quadrant abdominal pain for 3 weeks

EXAM:
CT ABDOMEN AND PELVIS WITHOUT CONTRAST
TECHNIQUE: Multidetector CT imaging of the abdomen and pelvis was performed
following the standard protocol without IV contrast.

[Series 2: axial st · axial · 0.85mm/px · z∈[-448,-68]mm · 14 of 88 slices shown, 16 images]
[im 6/88  soft-tissue]
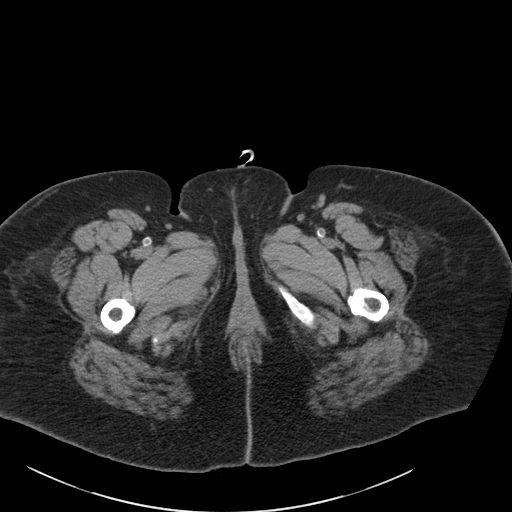
[im 6/88  bone]
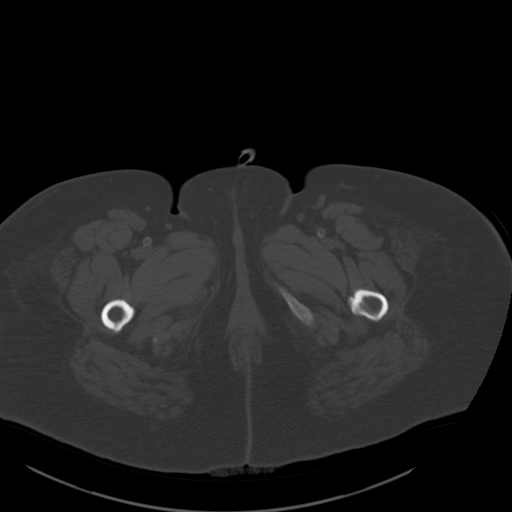
[im 11/88  soft-tissue]
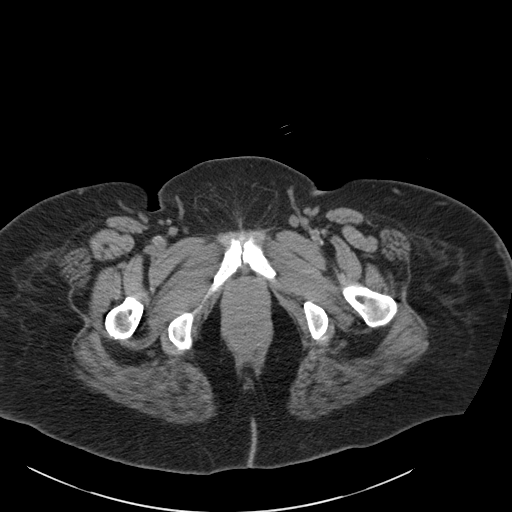
[im 16/88  soft-tissue]
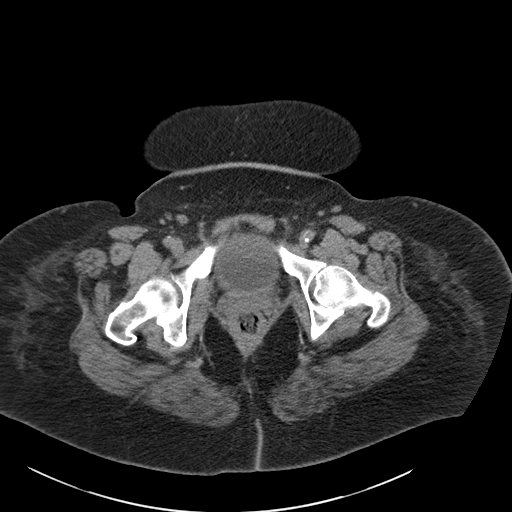
[im 26/88  soft-tissue]
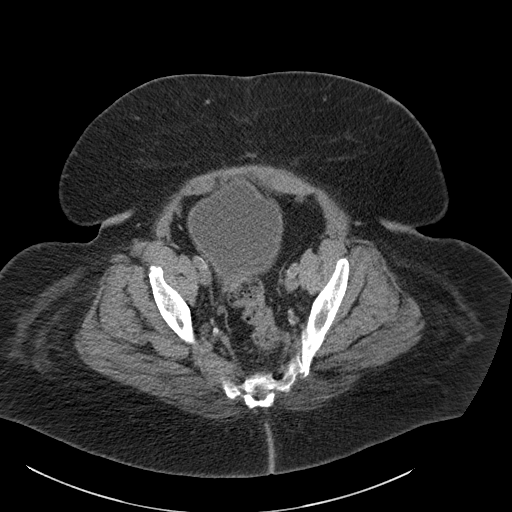
[im 31/88  soft-tissue]
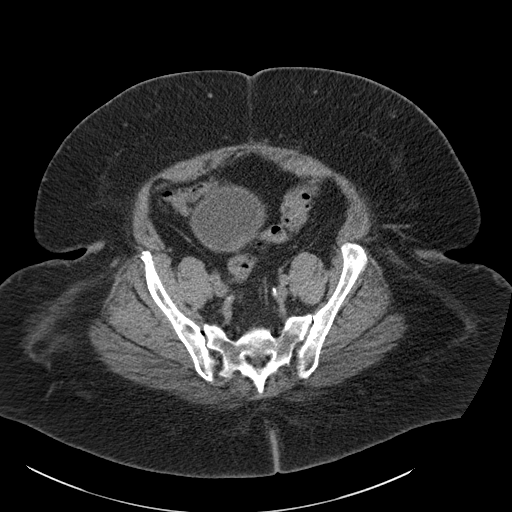
[im 36/88  soft-tissue]
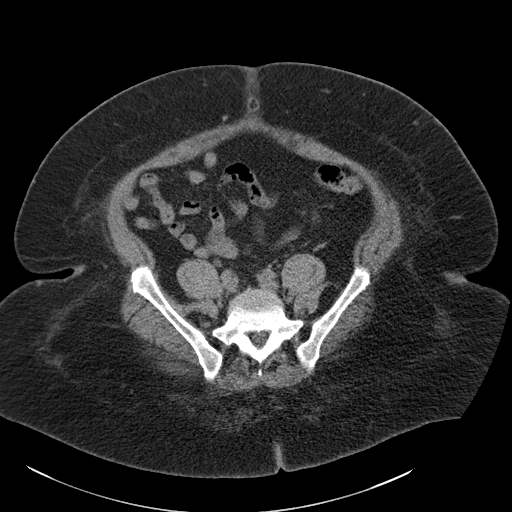
[im 41/88  soft-tissue]
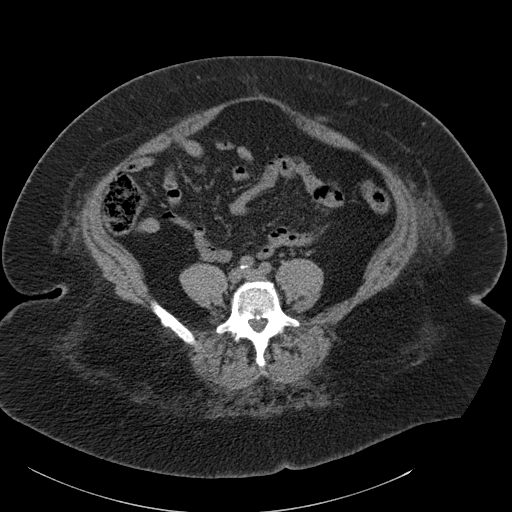
[im 47/88  soft-tissue]
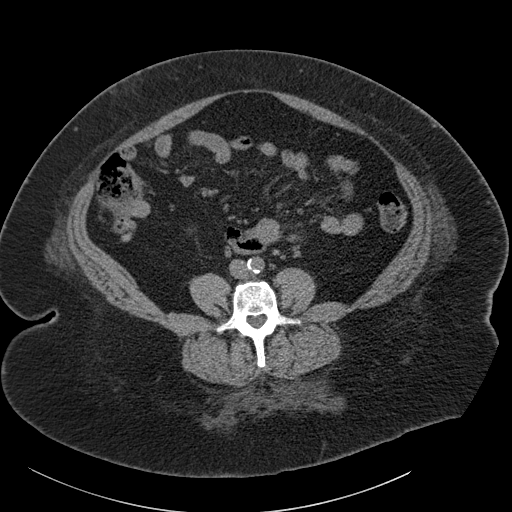
[im 52/88  soft-tissue]
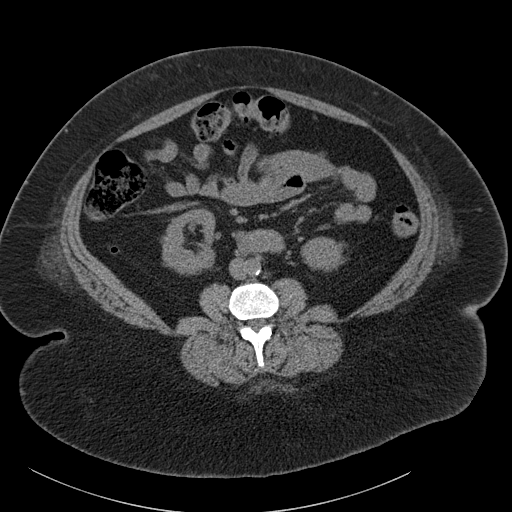
[im 52/88  bone]
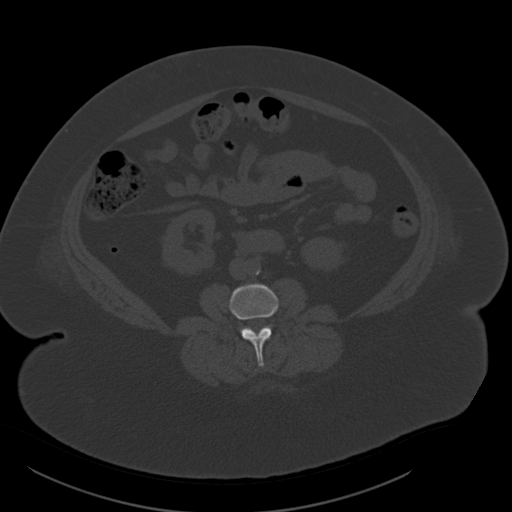
[im 57/88  soft-tissue]
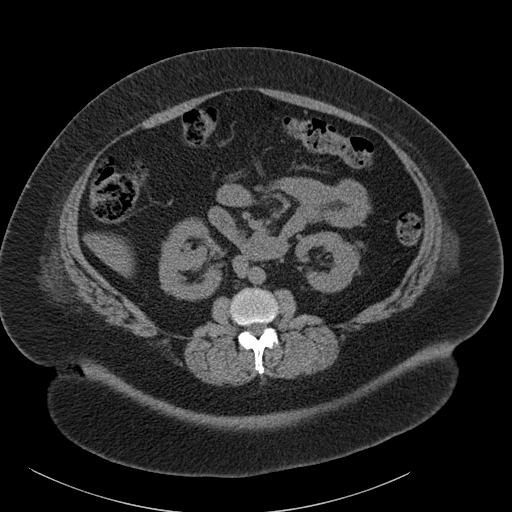
[im 67/88  soft-tissue]
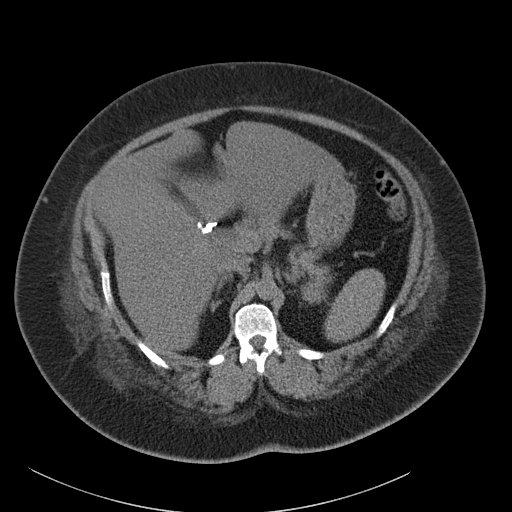
[im 72/88  soft-tissue]
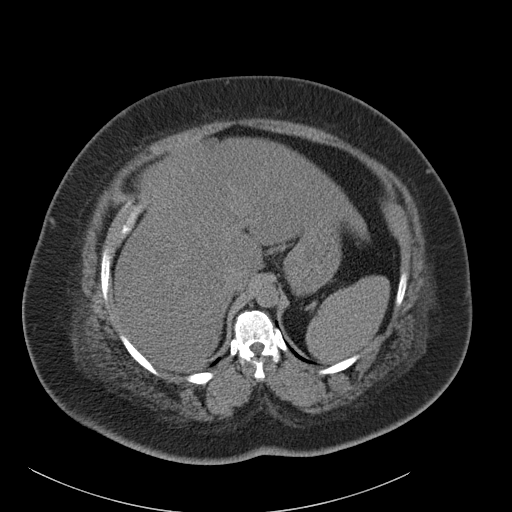
[im 77/88  soft-tissue]
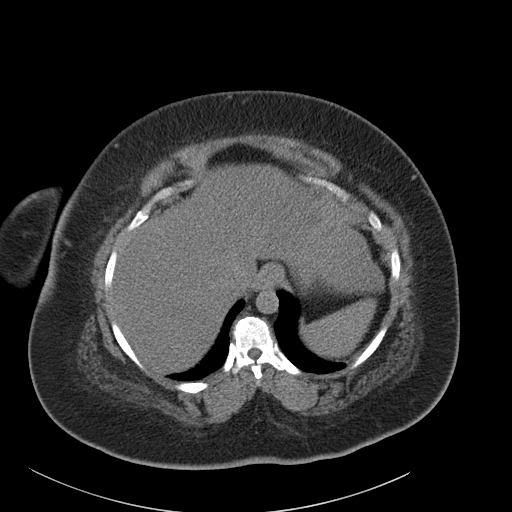
[im 82/88  soft-tissue]
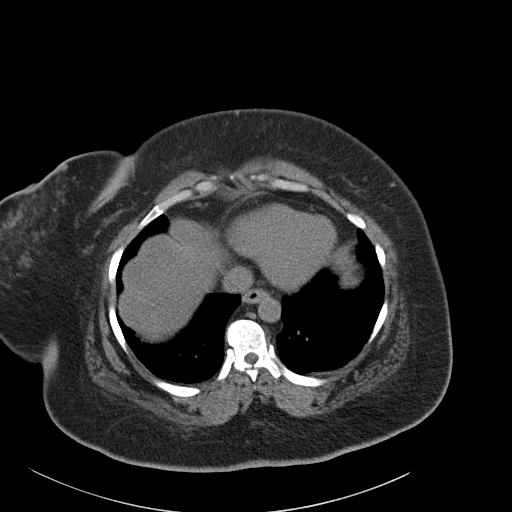

[Series 5: coronal · coronal · 0.91mm/px · 3 of 176 slices shown]
[im 59/176  soft-tissue]
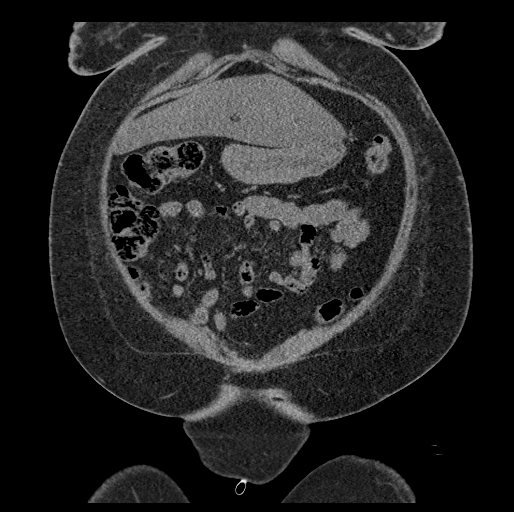
[im 78/176  soft-tissue]
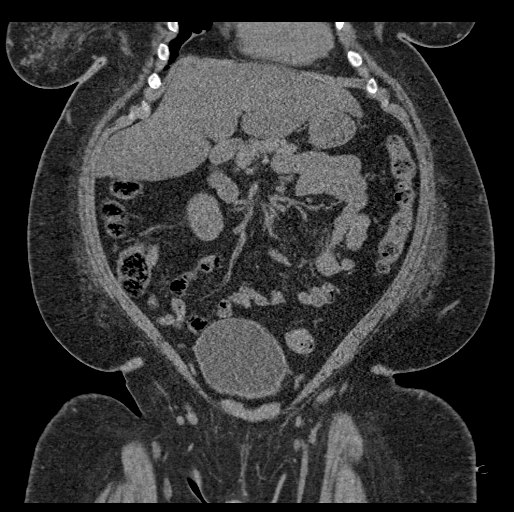
[im 98/176  soft-tissue]
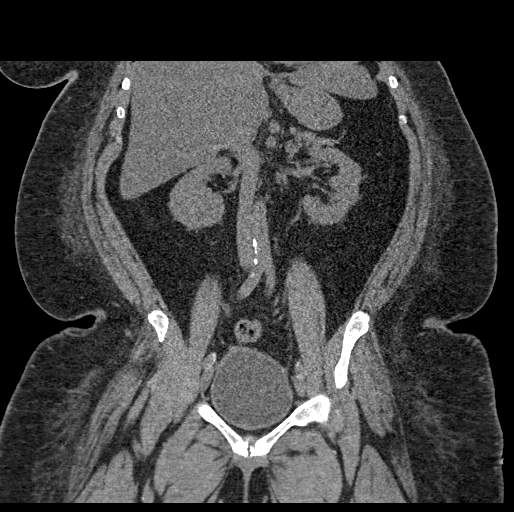

[17 of 46 positions shown; findings below may reference images not displayed]

FINDINGS: LOWER CHEST: Normal.

HEPATOBILIARY: Diffuse hypoattenuation of the liver relative to the
spleen suggests hepatic steatosis. No focal liver lesion or biliary
dilatation. Status post cholecystectomy.

PANCREAS: Normal pancreas. No ductal dilatation or peripancreatic
fluid collection.

SPLEEN: Normal.

ADRENALS/URINARY TRACT: The adrenal glands are normal. No
hydronephrosis, nephroureterolithiasis or solid renal mass. The
urinary bladder is normal for degree of distention

STOMACH/BOWEL: There is no hiatal hernia. Normal duodenal course and
caliber. No small bowel dilatation or inflammation. No focal colonic
abnormality. Normal appendix.

VASCULAR/LYMPHATIC: There is calcific atherosclerosis of the
abdominal aorta. No lymphadenopathy.

REPRODUCTIVE: Status post hysterectomy. No adnexal mass.

MUSCULOSKELETAL. No bony spinal canal stenosis or focal osseous
abnormality.

OTHER: None.
IMPRESSION: 1. No acute abnormality of the abdomen or pelvis. No obstructive
uropathy or nephrolithiasis.
2. Hepatic steatosis.
3. Aortic Atherosclerosis ([id number].V).

## 2021-12-26 NOTE — Progress Notes (Signed)
Subjective:  Patient ID: Amy [name], female    DOB: Oct 09, 1960,  MRN: [id number]  Amy [name] presents to clinic today for at risk foot care with history of diabetic neuropathy and callus(es) b/l lower extremities and painful thick toenails that are difficult to trim. Painful toenails interfere with ambulation. Aggravating factors include wearing enclosed shoe gear. Pain is relieved with periodic professional debridement. Painful calluses are aggravated when weightbearing with and without shoegear. Pain is relieved with periodic professional debridement.  Patient states blood glucose was 200 mg/dl  one month ago . Last known HgA1c was unknown.   New problem(s): None.   She is accompanied by her caregiver on today's visit.  PCP is Roselee Nova, MD , and last visit was  December, 2022.  Allergies  Allergen Reactions   Penicillins Hives and Swelling    Has patient had a PCN reaction causing immediate rash, facial/tongue/throat swelling, SOB or lightheadedness with hypotension: yes- face swelling Has patient had a PCN reaction causing severe rash involving mucus membranes or skin necrosis: no Has patient had a PCN reaction that required hospitalization unknown (childhood allergy) Has patient had a PCN reaction occurring within the last 10 years: no If all of the above answers are "NO", then may proceed with Cephalosporin use.    Tramadol Nausea And Vomiting    Intense nausea   Vicodin [Hydrocodone-Acetaminophen] Nausea And Vomiting   Codeine Nausea Only    Other reaction(s): Unknown   Iodine-131 Hives   Iohexol Hives    Pt developed itching and hives along with nasal congestion; needs 13 hour premeds for future studies, Onset Date: [id number]    Lisinopril Cough    Other reaction(s): Unknown   Sulfa Antibiotics Nausea And Vomiting    Review of Systems: Negative except as noted in the HPI.  Objective: No changes noted in today's physical examination. Constitutional  Amy [name] is a pleasant 61 y.o. African American female, obese in NAD. AAO x 3.   Vascular CFT <3 seconds b/l LE. Faintly palpable DP pulses b/l LE. Faintly palpable PT pulse(s) b/l LE. Pedal hair sparse. No pain with calf compression b/l. Lower extremity skin temperature gradient within normal limits. Trace edema noted BLE. No ischemia or gangrene noted b/l LE. No cyanosis or clubbing noted b/l LE. No cyanosis or clubbing noted.  Neurologic Normal speech. Oriented to person, place, and time. Pt has subjective symptoms of neuropathy. Protective sensation intact 5/5 intact bilaterally with 10g monofilament b/l. Vibratory sensation intact b/l.  Dermatologic No open wounds b/l LE. No interdigital macerations noted b/l LE. Toenails 1-5 b/l elongated, discolored, dystrophic, thickened, crumbly with subungual debris and tenderness to dorsal palpation. Hyperkeratotic lesion(s) bilateral heels. No erythema, no edema, no drainage, no fluctuance noted.   Orthopedic: Normal muscle strength 5/5 to all lower extremity muscle groups bilaterally. No pain, crepitus or joint limitation noted with ROM b/l LE. HAV with bunion deformity noted b/l LE. Hammertoe deformity noted 2-5 b/l.Marland Kitchen Patient ambulates with wheelchair assistance.   Radiographs: None    Latest Ref Rng & Units 07/21/2021    9:52 AM 12/30/2020    9:25 AM  Hemoglobin A1C  Hemoglobin-A1c 4.0 - 5.6 % 6.6  9.7    Assessment/Plan: 1. Pain due to onychomycosis of toenail   2. Callus   3. Diabetic peripheral neuropathy associated with type 2 diabetes mellitus ([place])   -Patient was evaluated and treated. All patient's and/or POA's questions/concerns answered on today's visit. -Advised patient to continue heel  protectors daily. -Medicaid ABN on file for paring of corns/calluses. -Patient to continue soft, supportive shoe gear daily. -Toenails 1-5 b/l were debrided in length and girth with sterile nail nippers and dremel without iatrogenic bleeding.   -Callus(es) bilateral heels pared utilizing mandrel sander without complication or incident. Total number debrided =2. -Patient/POA to call should there be question/concern in the interim.   Return in about 3 months (around 03/22/2022).  Marzetta Board, DPM

## 2021-12-30 NOTE — Procedures (Signed)
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 Leta Baptist in 9/17), and morbid obesity, who presents for re-evaluation of her OSA.   ADDITIONAL INFORMATION:  Height: 59.0 in Weight: 168 lb (BMI 33) Neck Size: 0.0  Medications: Albuterol, Zyloprim, Aspirin, Zyrtec, Lotrisone, Colchicine, Trulicity, Nexium, Neurontin, Atarax, Tresiba, Humalog, Synthroid, Linzess, Naprosyn, Macrobid, Benicar, Zofran ODT, Klor - Con, Zocor, Aldactone, Zanaflex, Demadex  Procedure DESCRIPTION: A sleep technologist was in attendance for the duration of the recording.  Data collection, scoring, video monitoring, and reporting were performed in compliance with the AASM Manual for the Scoring of Sleep and Associated Events; (Hypopnea is scored based on the criteria listed in Section VIII D. 1b in the AASM Manual V2.6 using a 4% oxygen desaturation rule or Hypopnea is scored based on the criteria listed in Section VIII D. 1a in the AASM Manual V2.6 using 3% oxygen desaturation and /or arousal rule).  A physician certified by the American Board of Sleep Medicine reviewed each epoch of the study.  FINDINGS:  Please refer to the attached summary for additional quantitative information.  STUDY DETAILS: Lights off was at 21:42: and lights on 05:15: (453 minutes hours in bed). This study was performed with an initial diagnostic portion followed by positive airway  pressure titration.   DIAGNOSTIC ANALYSIS:    SLEEP CONTINUITY AND SLEEP ARCHITECTURE, baseline:  The diagnostic portion of the study began at 21:42 and ended at 00:02, for a recording time of 2h 20.58m minutes.  Total sleep time was 114 minutes minutes (74.7% supine;  25.3% lateral;  0.0% prone, 0.0% REM sleep), with a decreased sleep efficiency at 81.8%. Sleep latency was normal at 9.5 minutes. REM sleep sleep was absent. Arousal index was 23.6 /hr. Of the total sleep time, the percentage of stage N1 sleep was 6.1%, stage N2 sleep was 93.9%, stage N3 sleep was 0.0%, and REM sleep was 0.0%. There were 0 Stage R periods observed during this portion of the study, 9 awakenings (i.e. transitions to Stage W from any sleep stage), and 25.0 total stage transitions. Wake after sleep onset (WASO) time accounted for 16 minutes. The absence of REM sleep may have been in part, due to patient's prosthetic eye with difficult to score eye movements.    AROUSAL (Baseline): There were 38.0 arousals in total, for an arousal index of 19.9 arousals/hour.  Of these, 36.0 were identified as respiratory-related arousals (18.9 /hr), 0 were PLM-related arousals (0.0 /hr), and 10 were non-specific arousals (5.2 /hr)   RESPIRATORY MONITORING:  Based on CMS criteria (using a 4% oxygen desaturation rule for scoring hypopneas), there were 152 apneas (152 obstructive; 0 central; 0 mixed), and 50 hypopneas.  Apnea index was 63.9. Hypopnea index was 15.2. The apnea-hypopnea index was 79.1/hour overall (66.0 supine; 0.0 REM, 0.0 supine REM). There were 0 respiratory effort-related arousals (RERAs).  The RERA index was 0.0 events/hr. Total respiratory disturbance index (RDI) was 79.1 events/hr. RDI  results showed: supine RDI  88.4 /hr; non-supine RDI 51.7 /hr; REM RDI 0.0 /hr, supine REM RDI 0.0 /hr.  Based on AASM criteria (using a 3% oxygen desaturation and /or arousal rule for scoring hypopneas), there were 152 apneas (152 obstructive; 0  central; 0 mixed), and 50 hypopneas.  Apnea index was 63.9. Hypopnea index was 15.2. The apnea-hypopnea index was 79.1/hour overall (66.0 supine; 0.0 REM, 0.0 supine REM). There were 0 respiratory effort-related arousals (RERAs). Total respiratory disturbance index (RDI) was 79.1 events/hr. RDI results showed: supine RDI 88.4 /hr; non-supine RDI 51.7 /hr; REM RDI 0.0 /hr, supine REM RDI 0.0 /hr.   Respiratory events were associated with oxyhemoglobin desaturations (nadir 50%) from a normal baseline (mean 97%). Total time spent at, or below 88% was 18.3 minutes, or 15.9%  of total sleep time. Snoring was absent. There were 0.0 occurrences of Cheyne Stokes breathing.   LIMB MOVEMENTS: There were 6 periodic limb movements of sleep (3.1/hr), of which 0 (0.0/hr) were associated with an arousal.   OXIMETRY: Total sleep time spent at, or below 88% was 10.5 minutes, or 9.2% of total sleep time. Snoring was classified as mild to moderate.   BODY POSITION: Duration of total sleep and percent of total sleep in their respective position is as follows: supine 85 minutes minutes (74.7%), non-supine 29.0 minutes (25.3%); right 00 minutes minutes (0.0%), left 29 minutes minutes (25.3%), and prone 00 minutes minutes (0.0%). Total supine REM sleep time was 00 minutes minutes (0.0% of total REM sleep).   Analysis of electrocardiogram activity showed the highest heart rate for the baseline portion of the study was 80.0 beats per minute.  The average heart rate during sleep was 66 bpm, while the highest heart rate for the same period was 80 bpm.    TREATMENT ANALYSIS:  SLEEP CONTINUITY AND SLEEP ARCHITECTURE, post-treatment:  The treatment portion of the study began at 00:02 and ended at 05:15, for a recording time of 5h 13.51m minutes.  Total sleep time was 256 minutes minutes (97.7% supine;  2.3% lateral; 0.0% prone, 0.0% REM sleep), with a decreased sleep efficiency at 81.9%. Sleep latency was normal at 10.0 minutes. REM  sleep sleep was absent. Arousal index was 2.3 /hr. Of the total sleep time, the percentage of stage N1 sleep was 1.6%, stage N3 sleep was 23.0%, and REM sleep was 0.0%. There were 0 Stage R periods observed during this portion of the study, 2 awakenings (i.e. transitions to Stage W from any sleep stage), and 9.0 total stage transitions. Wake after sleep onset (WASO) time accounted for 46 minutes.   AROUSAL: There were 8.0 arousals in total, for an arousal index of 1.9 arousals/hour.  Of these, 3.0 were identified as respiratory-related arousals (0.7 /hr), 0 were PLM-related arousals (0.0 /hr), and 7 were non-specific arousals (1.6 /hr)  RESPIRATORY MONITORING:    The patient was fitted with a small Vitera FFM. The patient was titrated from 5 cm to 10 cm. On the final pressure, her AHI was reduced to 1.9/hour with NREM sleep achieved, O2 nadir of 93%.    Respiratory events were associated with oxyhemoglobin desaturation (nadir 63.0%) from a mean of 97.0%.  Total time spent at, or below 88% was 9.9 minutes, or 3.8%  of total sleep time.  Snoring was improved.   LIMB MOVEMENTS: There were 0 periodic limb movements of sleep (0.0/hr), of which 0 (0.0/hr) were associated with an arousal.   OXIMETRY: Total sleep time spent at, or below 88%  was 6.2 minutes, or 2.4% of total sleep time. Snoring was classified as .   BODY POSITION: Duration of total sleep and percent of total sleep in their respective position is as follows: supine 250 minutes minutes (97.7%), non-supine 6.0 minutes (2.3%); right 00 minutes minutes (0.0%), left 06 minutes minutes (2.3%), and prone 00 minutes minutes (0.0%). Total supine REM sleep time was 00 minutes minutes (0.0% of total REM sleep).   Analysis of electrocardiogram activity showed the highest heart rate for the treatment portion of the study was 82.0 beats per minute.  The average heart rate during sleep was 67 bpm, while the highest heart rate for the same period was 82  bpm.    EEG: With the limited montage recorded, no EEG abnormalities were observed.    CARDIAC: The electrocardiogram documented normal sinus rhythm.  The average heart rate during sleep was 67 bpm.  The maximum heart rate during sleep was 82 bpm. The maximum heart rate during recording was 82.    BEHAVIORAL: No significant parasomnias were noted.   The patient had 2 restroom breaks for the night.    IMPRESSION and CODED DIAGNOSES:   1. Obstructive Sleep Apnea, Adult (G47.33-1), severe 2. Dysfunctions associated with sleep stages or arousal from sleep   RECOMMENDATIONS:   1. This patient has severe obstructive sleep apnea with a baseline AHI of 79.1/hour and O2 nadir of 50%. She responded well to CPAP therapy. I will recommend home CPAP treatment at a pressure of 10 cm via small Vitera FFM with heated humidity. The patient will be advised to be fully compliant with PAP therapy to improve sleep related symptoms and decrease long term cardiovascular risks. Please note that untreated obstructive sleep apnea may carry additional perioperative morbidity. Patients with significant obstructive sleep apnea should receive perioperative PAP therapy and the surgeons and particularly the anesthesiologist should be informed of the diagnosis and the severity of the sleep disordered breathing. 2. This study shows sleep fragmentation and abnormal sleep stage percentages; these are nonspecific findings and per se do not signify an intrinsic sleep disorder or a cause for the patient's sleep-related symptoms. Causes include (but are not limited to) the first night effect of the sleep study, circadian rhythm disturbances, medication effect or an underlying mood disorder or medical problem.  3. The patient should be cautioned not to drive, work at heights, or operate dangerous or heavy equipment when tired or sleepy. Review and reiteration of good sleep hygiene measures should be pursued with any patient. 4. The  patient will be seen in follow-up in the sleep clinic at [facility] for discussion of the test results, symptom and treatment compliance review, further management strategies, etc. The referring provider will be notified of the test results.   I certify that I have reviewed the entire raw data recording prior to the issuance of this report in accordance with the Standards of Accreditation of the American Academy of Sleep Medicine (AASM).  [name],  MD, PhD

## 2021-12-30 NOTE — Addendum Note (Signed)
Addended by: [name] on: 12/30/2021 06:37 PM   Modules accepted: Orders

## 2021-12-31 ENCOUNTER — Telehealth: Payer: Self-pay | Admitting: Family Medicine

## 2021-12-31 NOTE — Telephone Encounter (Signed)
Pt called wanting to know when she is going to receive her sleep study results. Pt also wanted to inform provider that her old machine has stopped working. Pt was advised to call [facility] to see if something can be done about her old machine until sleep study results come in and order for a new one is discussed.

## 2022-01-03 NOTE — Telephone Encounter (Signed)
Please see result note and notify pt.

## 2022-01-11 NOTE — Telephone Encounter (Signed)
I called pt. I advised pt that Dr. Rexene Alberts reviewed their sleep study results and found that pt severe osa. Dr.Athar recommends that pt starts CPAP at a pressure of 10 cm water pressure. I reviewed PAP compliance expectations with the pt. Pt is agreeable to starting a CPAP. I advised pt that an order will be sent to a DME, [facility], and [facility] will call the pt within about one week after they file with the pt's insurance. [facility]  will show the pt how to use the machine, fit for masks, and troubleshoot the CPAP if needed. A follow up appt was made for insurance purposes with Dr. Rexene Alberts on 03/30/2022 at 9:45 am. Pt verbalized understanding to arrive 15 minutes early and bring their CPAP. A letter with all of this information in it will be mailed to the pt as a reminder. I verified with the pt that the address we have on file is correct. Pt verbalized understanding of results. Pt had no questions at this time but was encouraged to call back if questions arise. I have sent the order to  and have received confirmation that they have received the order.

## 2022-01-11 NOTE — Telephone Encounter (Signed)
Pt has called for an update re: when she will get her new sleep apnea device.  The message from 8-21 from Amy, NP was relayed to her.  Pt is asking for a call as to where things stand.

## 2022-01-18 NOTE — Progress Notes (Unsigned)
Name: Amy [name]  Age/ Sex: 61 y.o., female   MRN/ DOB: [id number], Jul 30, 1960     PCP: Roselee Nova, MD   Reason for Endocrinology Evaluation: Type 2 Diabetes Mellitus  Initial Endocrine Consultative Visit: 06/29/2020    PATIENT IDENTIFIER: Amy [name] is a 61 y.o. female with a past medical history of T2DM, Hypothyroidism and Dyslipidemia. The patient has followed with Endocrinology clinic since 06/29/2020 for consultative assistance with management of her diabetes.  DIABETIC HISTORY:  Amy [name] was diagnosed with T2DM in her 46's. Her hemoglobin A1c has ranged from 9.9% in 2021, peaking at 12.3% in 2014.   SUBJECTIVE:      Today (01/18/2022): Amy [name] is here for a follow up on diabetes management. She is accompanied by her assistant. She Has not been using the freestyle libre due to getting a new phone that is incompatible. Prodigy meter is giving her false readings    She felt poorly one morning and prodigy registered a BG reading of 140, she called EMT and BG reading was 36 mg/dL .  Denies nausea or Vomiting or diarrhea     Pt follows with Amy [name]    HOME DIABETES REGIMEN:  Amy [name] 14 units daily Humalog 4/6/5 units  Trulicity 1.5 mg weekly ( Friday )     Statin: yes ACE-I/ARB: no     CONTINUOUS GLUCOSE MONITORING RECORD INTERPRETATION  : has not been using         DIABETIC COMPLICATIONS: Microvascular complications:   Denies: CKD III, neuropathy.  Left prosthetic eye. Blind right eye  Last Eye Exam: Completed 04/2020 ( Dr. 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 Her smoking use included cigarettes. She has never used smokeless tobacco. She reports that she does not drink alcohol and does not use drugs. Family History:  Family History  Problem Relation Age of Onset   Diabetes Sister    Glaucoma Sister    Hypertension Sister    Healthy Mother    Healthy Father    Stroke Brother    Heart attack Paternal Aunt    Breast cancer Neg Hx      HOME MEDICATIONS: Allergies as of 01/19/2022       Reactions   Penicillins Hives, Swelling   Has patient had a PCN reaction causing immediate rash, facial/tongue/throat swelling, SOB or lightheadedness with hypotension: yes- face swelling Has patient had a PCN reaction causing severe rash involving mucus membranes or skin necrosis: no Has patient had a PCN reaction that required hospitalization unknown (childhood allergy) Has patient had a PCN reaction occurring within the last 10 years: no If all of the above  answers are "NO", then may proceed with Cephalosporin use.   Tramadol Nausea And Vomiting   Intense nausea   Vicodin [hydrocodone-acetaminophen] Nausea And Vomiting   Codeine Nausea Only   Other reaction(s): Unknown   Iodine-131 Hives   Iohexol Hives   Pt developed itching and hives along with nasal congestion; needs 13 hour premeds for future studies, Onset Date: [id number]   Lisinopril Cough   Other reaction(s): Unknown   Sulfa Antibiotics Nausea And Vomiting        Medication List        Accurate as of January 18, 2022 11:14 AM. If you have any questions, ask your nurse or doctor.          albuterol 108 (90 Base) MCG/ACT inhaler Commonly known as: VENTOLIN HFA Inhale 2 puffs into the lungs  every 4 (four) hours as needed for wheezing or shortness of breath. For short   allopurinol 300 MG tablet Commonly known as: ZYLOPRIM Take 300 mg by mouth 2 (two) times daily.   aspirin EC 81 MG tablet Take 81 mg by mouth daily.   blood glucose meter kit and supplies Dispense based on patient and insurance preference. Use up to four times daily as directed. (FOR ICD-10 E10.9, E11.9).   cetirizine 10 MG tablet Commonly known as: ZYRTEC Take 10 mg by mouth daily.   clotrimazole-betamethasone cream Commonly known as: Lotrisone Apply 1 application topically 2 (two) times daily.   colchicine 0.6 MG tablet Take by mouth.   Dexcom G7 Sensor Misc 1 Device by Does not apply route as directed.   Embrace Talk Glucose Test test strip Generic drug: glucose blood USE TO CHECK BLOOD SUGARS FOUR TIMES A DAY BEFORE MEALS AND AT BEDTIME DX E11.29   esomeprazole 40 MG capsule Commonly known as: NEXIUM Take 1 capsule (40 mg total) by mouth daily.   FreeStyle [place] 2 Reader [place] As instructed. iddm with complications - retinopathy, neuropathy, nephropathy. With device and sensors.   gabapentin 300 MG capsule Commonly known as: NEURONTIN TAKE 1 CAPSULE (300 MG TOTAL) BY MOUTH 3 (THREE) TIMES DAILY. What changed: how much to take   gabapentin 400 MG capsule Commonly known as: NEURONTIN SMARTSIG:1 Capsule(s) By Mouth 1-4 Times Daily PRN What changed: Another medication with the same name was changed. Make sure you understand how and when to take each.   hydrOXYzine 25 MG tablet Commonly known as: ATARAX Take 0.5 tablets (12.5 mg total) by mouth every 8 (eight) hours as needed for itching.   insulin lispro 100 UNIT/ML KwikPen Commonly known as: HUMALOG MAX DAILY 30 UNITS   levothyroxine 150 MCG tablet Commonly known as: SYNTHROID Take 1 tablet (150 mcg total) by mouth daily. Needs appt for future refills   linaclotide 145 MCG Caps capsule Commonly known as: LINZESS Take 145 mcg  by mouth daily as needed (constipation).   naproxen 500 MG tablet Commonly known as: NAPROSYN Take 500 mg by mouth 2 (two) times daily with a meal.   nitrofurantoin (macrocrystal-monohydrate) 100 MG capsule Commonly known as: MACROBID Take 1 capsule (100 mg total) by mouth 2 (two) times daily.   olmesartan 20 MG tablet Commonly known as: BENICAR Take 10 mg by mouth daily.   ondansetron 4 MG disintegrating tablet Commonly known as: Zofran ODT Take 1 tablet (4 mg total) by mouth every 8 (eight) hours as needed for nausea or vomiting.   Pen Needles 31G X 8 MM Misc Inject 1 Device into the skin in the morning, at  noon, in the evening, and at bedtime.   potassium chloride SA 20 MEQ tablet Commonly known as: KLOR-CON M Take 1 tablet (20 mEq total) by mouth daily. Need appt for future refills   Prodigy Twist Top Lancets 28G Misc USE TO CHECK BLOOD SUGARS   simvastatin 20 MG tablet Commonly known as: ZOCOR Take 1 tablet (20 mg total) by mouth every evening.   spironolactone 25 MG tablet Commonly known as: ALDACTONE Take 0.5 tablets (12.5 mg total) by mouth daily. Needs appt for future refills   tiZANidine 2 MG tablet Commonly known as: ZANAFLEX Take 2 mg by mouth 2 (two) times daily.   torsemide 20 MG tablet Commonly known as: DEMADEX Take 4 tablets (80 mg total) by mouth daily. Need appt for future refills   Tresiba FlexTouch 200 UNIT/ML FlexTouch Pen Generic drug: insulin degludec Inject 12 Units into the skin daily at 6 (six) AM.   Trulicity 1.5 RJ/1.8AC Sopn Generic drug: Dulaglutide Inject 1.5 mg into the skin once a week.         OBJECTIVE:   Vital Signs: There were no vitals taken for this visit.  Wt Readings from Last 3 Encounters:  11/09/21 168 lb (76.2 kg)  07/21/21 182 lb (82.6 kg)  05/27/21 189 lb (85.7 kg)     Exam: General: Pt appears well and is in NAD  Lungs: Clear with good BS bilat with no rales, rhonchi, or wheezes  Heart: RRR with  normal S1 and S2 and no gallops; no murmurs; no rub  Extremities: Race  pretibial edema.   Neuro: MS is good with appropriate affect, pt is alert and Ox3       DM Foot Exam 05/06/2021   The skin of the feet shows pre-ulcerative callus at the right heel  The pedal pulses are 2+ on right and 2+ on left. The sensation is intact to a screening 5.07, 10 gram monofilament bilaterally     DATA REVIEWED:  Lab Results  Component Value Date   HGBA1C 6.6 (A) 07/21/2021   HGBA1C 9.7 (A) 12/30/2020   HGBA1C 7.6 (A) 09/18/2020   Lab Results  Component Value Date   MICROALBUR 9.74 (H) 10/30/2012   LDLCALC 66 04/13/2020   CREATININE 1.27 (H) 12/10/2021   Results for ALBIE, BAZIN (MRN [id number]) as of 12/30/2020 09:40  Ref. Range 06/08/2020 15:43  Sodium Latest Ref Range: 134 - 144 mmol/L 145 (H)  Potassium Latest Ref Range: 3.5 - 5.2 mmol/L 4.6  Chloride Latest Ref Range: 96 - 106 mmol/L 107 (H)  CO2 Latest Ref Range: 20 - 29 mmol/L 26  Glucose Latest Ref Range: 65 - 99 mg/dL 63 (L)  BUN Latest Ref Range: 8 - 27 mg/dL 44 (H)  Creatinine Latest Ref Range: 0.57 - 1.00 mg/dL 1.49 (H)  Calcium Latest Ref Range: 8.7 - 10.3 mg/dL 9.9  BUN/Creatinine Ratio Latest Ref Range: 12 - 28  30 (H)  GFR, Est Non African American Latest Ref Range: >59 mL/min/1.73 38 (L)  GFR, Est African American Latest Ref Range: >59 mL/min/1.73 43 (L)       ASSESSMENT / PLAN / RECOMMENDATIONS:   1) Type 2 Diabetes Mellitus, Optimally  controlled, With neuropathic, retinopathic and CKD III complications - Most recent A1c of 6.6 %. Goal A1c < 7.0 %.    - Her A1c is optimal , no glucose data and the concern is hypoglycemia. Her fasting BG today is 122 mg/dL.  - I am going to reduce her basal  insulin based on her endorsement of hypoglycemia one morning  - Main barrier to diabetes self care if visual impairment - She was taken to our CDE for freestyle libre trouble shooting   MEDICATIONS: - Decrease Tresiba  12 units daily - Continue Trulicity 1.5 mg weekly  - Continue  Humalog 6 units with breakfast, 6 units with lunch, and 8 units with supper   EDUCATION / INSTRUCTIONS: BG monitoring instructions: Patient is instructed to check her blood sugars 3 times a day, before meals. Call [facility] if: BG persistently < 70  I reviewed the Rule of 15 for the treatment of hypoglycemia in detail with the patient. Literature supplied.    2) Diabetic complications:  Eye: Does  have known diabetic retinopathy.  Neuro/ Feet: Does  have known diabetic peripheral neuropathy .  Renal: Patient does  have known baseline CKD. She   is not on an ACEI/ARB at present.      F/U in 6 months   Signed electronically by: Mack Guise, MD  [facility]  [facility] [address] [place], [place] [address] Phone: [phone] FAX: [phone]   CC: Roselee Nova, [place] [address] Phone: [phone]  Fax: [phone]  Return to Endocrinology clinic as below: Future Appointments  Date Time Provider [place]  01/19/2022 11:10 AM Kaysey Berndt, Melanie Crazier, MD [facility] None  03/30/2022  9:45 AM [name], MD [facility] None

## 2022-01-19 ENCOUNTER — Encounter: Payer: Self-pay | Admitting: Internal Medicine

## 2022-01-19 ENCOUNTER — Ambulatory Visit (INDEPENDENT_AMBULATORY_CARE_PROVIDER_SITE_OTHER): Payer: Medicare Other | Admitting: Internal Medicine

## 2022-01-19 VITALS — BP 130/74 | HR 73 | Ht 59.0 in | Wt 167.0 lb

## 2022-01-19 DIAGNOSIS — E11319 Type 2 diabetes mellitus with unspecified diabetic retinopathy without macular edema: Secondary | ICD-10-CM | POA: Diagnosis not present

## 2022-01-19 DIAGNOSIS — E1142 Type 2 diabetes mellitus with diabetic polyneuropathy: Secondary | ICD-10-CM

## 2022-01-19 DIAGNOSIS — Z794 Long term (current) use of insulin: Secondary | ICD-10-CM | POA: Diagnosis not present

## 2022-01-19 DIAGNOSIS — E1165 Type 2 diabetes mellitus with hyperglycemia: Secondary | ICD-10-CM | POA: Diagnosis not present

## 2022-01-19 LAB — POCT GLYCOSYLATED HEMOGLOBIN (HGB A1C): Hemoglobin A1C: 7.4 % — AB (ref 4.0–5.6)

## 2022-01-19 MED ORDER — PEN NEEDLES 31G X 8 MM MISC
3 refills | Status: DC
Start: 2022-01-19 — End: 2023-03-23

## 2022-01-19 MED ORDER — INSULIN LISPRO (1 UNIT DIAL) 100 UNIT/ML (KWIKPEN)
PEN_INJECTOR | SUBCUTANEOUS | 3 refills | Status: DC
Start: 2022-01-19 — End: 2023-03-23

## 2022-01-19 NOTE — Patient Instructions (Signed)
-   Increase Tresiba  10 units daily - Continue Trulicity 1.5  mg weekly  - Continue  Humalog  6 units with Breakfast, 6 units with Lunch and 6 units with Supper      HOW TO TREAT LOW BLOOD SUGARS (Blood sugar LESS THAN 70 MG/DL) Please follow the RULE OF 15 for the treatment of hypoglycemia treatment (when your (blood sugars are less than 70 mg/dL)   STEP 1: Take 15 grams of carbohydrates when your blood sugar is low, which includes:  3-4 GLUCOSE TABS  OR 3-4 OZ OF JUICE OR REGULAR SODA OR ONE TUBE OF GLUCOSE GEL    STEP 2: RECHECK blood sugar in 15 MINUTES STEP 3: If your blood sugar is still low at the 15 minute recheck --> then, go back to STEP 1 and treat AGAIN with another 15 grams of carbohydrates

## 2022-01-20 ENCOUNTER — Encounter: Payer: Self-pay | Admitting: Internal Medicine

## 2022-03-07 NOTE — Telephone Encounter (Signed)
Pt has been r/s for a December appt. Informed pt to bring her cpap machine and the power cord unless otherwise instructed by the RN.

## 2022-03-07 NOTE — Telephone Encounter (Signed)
Set up confirmation received via fax. Pt was set up 03/04/2022. Can you please push her initial cpap visit out 30-90 days after set up date? Currently scheduled for 03/30/22, which is a few days to soon.  Thanks.

## 2022-03-14 ENCOUNTER — Telehealth: Payer: Self-pay | Admitting: Neurology

## 2022-03-14 ENCOUNTER — Encounter: Payer: Self-pay | Admitting: Neurology

## 2022-03-14 NOTE — Telephone Encounter (Signed)
No VM box or mychart use, sent letter to inform pt of need to reschedule 12/28 appointment - MD out

## 2022-03-29 ENCOUNTER — Telehealth: Payer: Self-pay

## 2022-03-29 ENCOUNTER — Other Ambulatory Visit: Payer: Self-pay

## 2022-03-29 MED ORDER — TRESIBA FLEXTOUCH 200 UNIT/ML ~~LOC~~ SOPN: 12.0000 [IU] | PEN_INJECTOR | Freq: Every day | SUBCUTANEOUS | 2 refills | Status: DC

## 2022-03-29 NOTE — Telephone Encounter (Signed)
Patient will pick up a sample of [place] tomorrow .

## 2022-03-30 ENCOUNTER — Ambulatory Visit: Payer: Medicare Other | Admitting: Neurology

## 2022-03-30 ENCOUNTER — Telehealth: Payer: Self-pay

## 2022-03-30 NOTE — Telephone Encounter (Signed)
Patient needs help with her CGM. States that she doesn't remember any information to sign in. Patient advise that she would need to have someone help find the login information or reset password and username. Patient would like to speak with you to see if you could assist.

## 2022-04-13 ENCOUNTER — Telehealth: Payer: Self-pay

## 2022-04-13 NOTE — Telephone Encounter (Signed)
Patient states that pcp took her off her insulin medications because her A1C was 6.2. Patient wants to make sure that you agree with the change.

## 2022-04-13 NOTE — Telephone Encounter (Signed)
Patient advised and verbalized understanding 

## 2022-04-13 NOTE — Telephone Encounter (Signed)
Can we please updated patient pcp. Dr. Purvis Kilts at [facility]. [phone].

## 2022-04-15 ENCOUNTER — Emergency Department (HOSPITAL_COMMUNITY)
Admission: EM | Admit: 2022-04-15 | Discharge: 2022-04-15 | Disposition: A | Discharge status: Home / Self Care | Payer: Medicare Other | Attending: Emergency Medicine | Admitting: Emergency Medicine

## 2022-04-15 ENCOUNTER — Encounter (HOSPITAL_COMMUNITY): Payer: Self-pay

## 2022-04-15 ENCOUNTER — Other Ambulatory Visit: Payer: Self-pay

## 2022-04-15 DIAGNOSIS — E1165 Type 2 diabetes mellitus with hyperglycemia: Secondary | ICD-10-CM | POA: Diagnosis not present

## 2022-04-15 DIAGNOSIS — R739 Hyperglycemia, unspecified: Secondary | ICD-10-CM | POA: Diagnosis present

## 2022-04-15 DIAGNOSIS — E039 Hypothyroidism, unspecified: Secondary | ICD-10-CM | POA: Insufficient documentation

## 2022-04-15 DIAGNOSIS — J45909 Unspecified asthma, uncomplicated: Secondary | ICD-10-CM | POA: Diagnosis not present

## 2022-04-15 DIAGNOSIS — R7989 Other specified abnormal findings of blood chemistry: Secondary | ICD-10-CM

## 2022-04-15 DIAGNOSIS — I10 Essential (primary) hypertension: Secondary | ICD-10-CM | POA: Diagnosis not present

## 2022-04-15 DIAGNOSIS — Z79899 Other long term (current) drug therapy: Secondary | ICD-10-CM | POA: Insufficient documentation

## 2022-04-15 DIAGNOSIS — Z7982 Long term (current) use of aspirin: Secondary | ICD-10-CM | POA: Insufficient documentation

## 2022-04-15 LAB — URINALYSIS, ROUTINE W REFLEX MICROSCOPIC
Bilirubin Urine: NEGATIVE
Glucose, UA: >=500 mg/dL — AB
Hgb urine dipstick: NEGATIVE
Ketones, ur: NEGATIVE mg/dL
Leukocytes,Ua: NEGATIVE
Nitrite: NEGATIVE
Protein, ur: 30 mg/dL — AB
Specific Gravity, Urine: 1.018 (ref 1.005–1.030)
pH: 5 (ref 5.0–8.0)

## 2022-04-15 LAB — CBC WITH DIFFERENTIAL/PLATELET
Abs Immature Granulocytes: 0.02 10*3/uL (ref 0.00–0.07)
Basophils Absolute: 0 10*3/uL (ref 0.0–0.1)
Basophils Relative: 1 %
Eosinophils Absolute: 0.1 10*3/uL (ref 0.0–0.5)
Eosinophils Relative: 2 %
HCT: 36.8 % (ref 36.0–46.0)
Hemoglobin: 11.8 g/dL — ABNORMAL LOW (ref 12.0–15.0)
Immature Granulocytes: 0 %
Lymphocytes Relative: 19 %
Lymphs Abs: 1.2 10*3/uL (ref 0.7–4.0)
MCH: 27.3 pg (ref 26.0–34.0)
MCHC: 32.1 g/dL (ref 30.0–36.0)
MCV: 85.2 fL (ref 80.0–100.0)
Monocytes Absolute: 0.7 10*3/uL (ref 0.1–1.0)
Monocytes Relative: 10 %
Neutro Abs: 4.3 10*3/uL (ref 1.7–7.7)
Neutrophils Relative %: 68 %
Platelets: 167 10*3/uL (ref 150–400)
RBC: 4.32 MIL/uL (ref 3.87–5.11)
RDW: 14.2 % (ref 11.5–15.5)
WBC: 6.2 10*3/uL (ref 4.0–10.5)
nRBC: 0 % (ref 0.0–0.2)

## 2022-04-15 LAB — BASIC METABOLIC PANEL
Anion gap: 6 (ref 5–15)
BUN: 23 mg/dL (ref 8–23)
CO2: 28 mmol/L (ref 22–32)
Calcium: 9.7 mg/dL (ref 8.9–10.3)
Chloride: 110 mmol/L (ref 98–111)
Creatinine, Ser: 1.79 mg/dL — ABNORMAL HIGH (ref 0.44–1.00)
GFR, Estimated: 31 mL/min — ABNORMAL LOW (ref >60)
Glucose, Bld: 237 mg/dL — ABNORMAL HIGH (ref 70–99)
Potassium: 3.4 mmol/L — ABNORMAL LOW (ref 3.5–5.1)
Sodium: 144 mmol/L (ref 135–145)

## 2022-04-15 LAB — CBG MONITORING, ED: Glucose-Capillary: 280 mg/dL — ABNORMAL HIGH (ref 70–99)

## 2022-04-15 NOTE — Discharge Instructions (Addendum)
Take your insulin as directed by your endocrinologist. Your Cr (kidney number) is slightly elevated today, recommend home to hydrate and have your doctor recheck your labs next week.

## 2022-04-15 NOTE — ED Triage Notes (Signed)
Per EMS- Patient is from home and is blind. Patient told EMS that she had a physician tell her 2 weeks to stop the insulin due to a normal A1C. Then another doctor told her to resume. Patient states she has been taking insulin as needed.  Patient states her phone and the Dexcom are not working. EMS CBG-466. Patient gave herself 6 units Humolog at 0820. CBG in triage-280.

## 2022-04-15 NOTE — ED Provider Notes (Signed)
[facility] DEPT Provider Note   CSN: [id number] Arrival date & time: 04/15/22  9021     History  Chief Complaint  Patient presents with   Hyperglycemia    Amy [name] is a 61 y.o. female.  61 year old female with past medical history of diabetes, asthma, hypertension, hyperlipidemia, hypothyroid, blind, presents with concern for hyperglycemia.  Patient is followed by PCP and endocrinology.  Patient went to PCP visit recently, told that her hemoglobin A1c was 6.1 and that she could discontinue her insulin.  Patient called her endocrinologist office, she was unaware of the note in the file with the endocrinologist reports that her A1c is not going to be accurate due to her CKD and recommends that she continue to use her Dexcom and take her insulin as directed.  Patient woke up this morning with polyuria and polydipsia, feeling poorly and found her CBG to be greater than 600.  Patient gave herself 6 units of Humalog and called EMS.  EMS notes CBG to be 466 on arrival.  Patient denies abdominal pain, vomiting, chest pain or difficulty breathing.  No other complaints or concerns today.  She does note that her phone is not working and her Dexcom connects to her phone.       Home Medications Prior to Admission medications   Medication Sig Start Date End Date Taking? Authorizing Provider  albuterol (PROVENTIL HFA;VENTOLIN HFA) 108 (90 BASE) MCG/ACT inhaler Inhale 2 puffs into the lungs every 4 (four) hours as needed for wheezing or shortness of breath. For short    [provider]  allopurinol (ZYLOPRIM) 300 MG tablet Take 300 mg by mouth 2 (two) times daily. 11/23/20   [provider]  aspirin EC 81 MG tablet Take 81 mg by mouth daily.    [provider]  blood glucose meter kit and supplies Dispense based on patient and insurance preference. Use up to four times daily as directed. (FOR ICD-10 E10.9, E11.9). 08/06/20   Wendie Agreste, MD  cetirizine (ZYRTEC) 10 MG tablet Take 10 mg by mouth daily.    [provider]  clotrimazole-betamethasone (LOTRISONE) cream Apply 1 application topically 2 (two) times daily. 07/08/21   McDonald, Stephan Minister, DPM  colchicine 0.6 MG tablet Take by mouth. 12/14/20   [provider]  Continuous Blood Gluc Sensor (DEXCOM G7 SENSOR) MISC 1 Device by Does not apply route as directed. 10/19/21   Shamleffer, Melanie Crazier, MD  Dulaglutide (TRULICITY) 1.5 JD/5.5MC SOPN Inject 1.5 mg into the skin once a week. 05/06/21   Shamleffer, Melanie Crazier, MD  EMBRACE TALK GLUCOSE TEST test strip USE TO CHECK BLOOD SUGARS FOUR TIMES A DAY BEFORE MEALS AND AT BEDTIME DX E11.29 07/29/20   Wendie Agreste, MD  esomeprazole (NEXIUM) 40 MG capsule Take 1 capsule (40 mg total) by mouth daily. 07/10/20   Bensimhon, Shaune Pascal, MD  gabapentin (NEURONTIN) 300 MG capsule TAKE 1 CAPSULE (300 MG TOTAL) BY MOUTH 3 (THREE) TIMES DAILY. Patient taking differently: Take 400 mg by mouth 3 (three) times daily. 12/21/20   Hilts, Legrand Como, MD  gabapentin (NEURONTIN) 400 MG capsule SMARTSIG:1 Capsule(s) By Mouth 1-4 Times Daily PRN 08/10/21   [provider]  hydrOXYzine (ATARAX/VISTARIL) 25 MG tablet Take 0.5 tablets (12.5 mg total) by mouth every 8 (eight) hours as needed for itching. 12/05/20   Jaynee Eagles, PA-C  insulin lispro (HUMALOG) 100 UNIT/ML KwikPen MAX DAILY 30 UNITS 01/19/22   Shamleffer, Melanie Crazier, MD  Insulin Pen Needle (PEN NEEDLES) 31G X 8 MM MISC Inject 1 Device into the skin in the morning, at noon, in the evening, and at bedtime. 01/19/22   Shamleffer, Melanie Crazier, MD  levothyroxine (SYNTHROID) 150 MCG tablet Take 1 tablet (150 mcg total) by mouth daily. Needs appt for future refills 08/06/20   Wendie Agreste, MD  linaclotide [facility]) 145 MCG CAPS capsule Take 145 mcg by mouth daily as needed (constipation).     [provider]  naproxen (NAPROSYN) 500 MG tablet Take 500 mg by  mouth 2 (two) times daily with a meal.    [provider]  nitrofurantoin, macrocrystal-monohydrate, (MACROBID) 100 MG capsule Take 1 capsule (100 mg total) by mouth 2 (two) times daily. 11/06/21   Nyoka Lint, PA-C  olmesartan (BENICAR) 20 MG tablet Take 10 mg by mouth daily. 04/26/21   [provider]  ondansetron (ZOFRAN ODT) 4 MG disintegrating tablet Take 1 tablet (4 mg total) by mouth every 8 (eight) hours as needed for nausea or vomiting. 07/09/19   Antonietta Breach, PA-C  potassium chloride SA (KLOR-CON) 20 MEQ tablet Take 1 tablet (20 mEq total) by mouth daily. Need appt for future refills 08/06/20   Wendie Agreste, MD  Prodigy Twist Top Lancets 28G MISC USE TO CHECK BLOOD SUGARS 10/02/20   [provider]  simvastatin (ZOCOR) 20 MG tablet Take 1 tablet (20 mg total) by mouth every evening. 08/06/20   Wendie Agreste, MD  spironolactone (ALDACTONE) 25 MG tablet Take 0.5 tablets (12.5 mg total) by mouth daily. Needs appt for future refills 08/06/20   Wendie Agreste, MD  tiZANidine (ZANAFLEX) 2 MG tablet Take 2 mg by mouth 2 (two) times daily. 10/02/20   [provider]  torsemide (DEMADEX) 20 MG tablet Take 4 tablets (80 mg total) by mouth daily. Need appt for future refills 08/06/20   Wendie Agreste, MD  TRESIBA FLEXTOUCH 200 UNIT/ML FlexTouch Pen Inject 12 Units into the skin daily at 6 (six) AM. 03/29/22   Shamleffer, Melanie Crazier, MD      Allergies    Penicillins, Tramadol, Vicodin [hydrocodone-acetaminophen], Codeine, Iodine-131, Iohexol, Lisinopril, and Sulfa antibiotics    Review of Systems   Review of Systems Negative except as per HPI Physical Exam Updated Vital Signs BP 117/60 (BP Location: Left Arm)   Pulse 62   Temp 97.9 F (36.6 C) (Oral)   Resp 16   Ht _0  (1.499 m)   Wt 77.1 kg   SpO2 99%   BMI 34.34 kg/m  Physical Exam Vitals and nursing note reviewed.  Constitutional:      General: She is not in acute distress.     Appearance: She is well-developed. She is not diaphoretic.  HENT:     Head: Normocephalic and atraumatic.  Cardiovascular:     Rate and Rhythm: Normal rate and regular rhythm.     Heart sounds: Normal heart sounds.  Pulmonary:     Effort: Pulmonary effort is normal.     Breath sounds: Normal breath sounds.  Abdominal:     Palpations: Abdomen is soft.     Tenderness: There is no abdominal tenderness.  Skin:    General: Skin is warm and dry.     Findings: No erythema or rash.  Neurological:     Mental Status: She is alert and oriented to person, place, and time.  Psychiatric:        Behavior: Behavior normal.     ED  Results / Procedures / Treatments   Labs (all labs ordered are listed, but only abnormal results are displayed) Labs Reviewed  CBC WITH DIFFERENTIAL/PLATELET - Abnormal; Notable for the following components:      Result Value   Hemoglobin 11.8 (*)    All other components within normal limits  BASIC METABOLIC PANEL - Abnormal; Notable for the following components:   Potassium 3.4 (*)    Glucose, Bld 237 (*)    Creatinine, Ser 1.79 (*)    GFR, Estimated 31 (*)    All other components within normal limits  URINALYSIS, ROUTINE W REFLEX MICROSCOPIC - Abnormal; Notable for the following components:   Color, Urine STRAW (*)    APPearance HAZY (*)    Glucose, UA >=500 (*)    Protein, ur 30 (*)    Bacteria, UA RARE (*)    All other components within normal limits  CBG MONITORING, ED - Abnormal; Notable for the following components:   Glucose-Capillary 280 (*)    All other components within normal limits    EKG None  Radiology No results found.  Procedures Procedures    Medications Ordered in ED Medications - No data to display  ED Course/ Medical Decision Making/ A&P                           Medical Decision Making Amount and/or Complexity of Data Reviewed Labs: ordered.   61 year old female presents with hyperglycemia after discontinuing her  insulin per PCP recommendation.  Patient self administered 6 units of Humalog prior to arrival.  Arrives to the emergency room with CBG of 280.  She does not have any other complaints today as her polyuria and polydipsia have resolved.  Labs are obtained.  CBC is unremarkable.  Urinalysis is positive for protein, negative for ketones, no evidence of UTI.  BMP with mild hypokalemia with potassium of 3.4.  Her glucose is 237 with a mild increase in her creatinine to 1.79 compared to prior on file. Discussed results with ER attending.  Plan is for patient to go home and hydrate, and continue her insulin as scheduled and follow-up with her primary care provider. Discussed results with patient.  Call to patient's sister at her request, sister is on her way here to take patient home.  Sister also has a Dexcom and will assist patient with monitoring her Dexcom.        Final Clinical Impression(s) / ED Diagnoses Final diagnoses:  Hyperglycemia  Elevated serum creatinine    Rx / DC Orders ED Discharge Orders     None         Tacy Learn, PA-C 04/15/22 [place], Ankit, MD 04/22/22 [phone]

## 2022-04-16 ENCOUNTER — Emergency Department (HOSPITAL_COMMUNITY)
Admission: EM | Admit: 2022-04-16 | Discharge: 2022-04-17 | Disposition: A | Discharge status: Home / Self Care | Payer: Medicare Other | Attending: Emergency Medicine | Admitting: Emergency Medicine

## 2022-04-16 ENCOUNTER — Other Ambulatory Visit: Payer: Self-pay

## 2022-04-16 DIAGNOSIS — Z7982 Long term (current) use of aspirin: Secondary | ICD-10-CM | POA: Insufficient documentation

## 2022-04-16 DIAGNOSIS — E1122 Type 2 diabetes mellitus with diabetic chronic kidney disease: Secondary | ICD-10-CM | POA: Diagnosis not present

## 2022-04-16 DIAGNOSIS — Z794 Long term (current) use of insulin: Secondary | ICD-10-CM | POA: Insufficient documentation

## 2022-04-16 DIAGNOSIS — R739 Hyperglycemia, unspecified: Secondary | ICD-10-CM | POA: Diagnosis present

## 2022-04-16 DIAGNOSIS — N189 Chronic kidney disease, unspecified: Secondary | ICD-10-CM | POA: Insufficient documentation

## 2022-04-16 DIAGNOSIS — Z7984 Long term (current) use of oral hypoglycemic drugs: Secondary | ICD-10-CM | POA: Diagnosis not present

## 2022-04-16 DIAGNOSIS — E1165 Type 2 diabetes mellitus with hyperglycemia: Secondary | ICD-10-CM | POA: Diagnosis not present

## 2022-04-16 LAB — BASIC METABOLIC PANEL
Anion gap: 10 (ref 5–15)
BUN: 25 mg/dL — ABNORMAL HIGH (ref 8–23)
CO2: 19 mmol/L — ABNORMAL LOW (ref 22–32)
Calcium: 8.1 mg/dL — ABNORMAL LOW (ref 8.9–10.3)
Chloride: 112 mmol/L — ABNORMAL HIGH (ref 98–111)
Creatinine, Ser: 1.68 mg/dL — ABNORMAL HIGH (ref 0.44–1.00)
GFR, Estimated: 34 mL/min — ABNORMAL LOW (ref >60)
Glucose, Bld: 277 mg/dL — ABNORMAL HIGH (ref 70–99)
Potassium: 3.1 mmol/L — ABNORMAL LOW (ref 3.5–5.1)
Sodium: 141 mmol/L (ref 135–145)

## 2022-04-16 LAB — CBC WITH DIFFERENTIAL/PLATELET
Abs Immature Granulocytes: 0.01 10*3/uL (ref 0.00–0.07)
Basophils Absolute: 0 10*3/uL (ref 0.0–0.1)
Basophils Relative: 0 %
Eosinophils Absolute: 0.1 10*3/uL (ref 0.0–0.5)
Eosinophils Relative: 2 %
HCT: 36.5 % (ref 36.0–46.0)
Hemoglobin: 11.5 g/dL — ABNORMAL LOW (ref 12.0–15.0)
Immature Granulocytes: 0 %
Lymphocytes Relative: 16 %
Lymphs Abs: 1.2 10*3/uL (ref 0.7–4.0)
MCH: 27.3 pg (ref 26.0–34.0)
MCHC: 31.5 g/dL (ref 30.0–36.0)
MCV: 86.7 fL (ref 80.0–100.0)
Monocytes Absolute: 0.8 10*3/uL (ref 0.1–1.0)
Monocytes Relative: 11 %
Neutro Abs: 5.3 10*3/uL (ref 1.7–7.7)
Neutrophils Relative %: 71 %
Platelets: 160 10*3/uL (ref 150–400)
RBC: 4.21 MIL/uL (ref 3.87–5.11)
RDW: 14.3 % (ref 11.5–15.5)
WBC: 7.4 10*3/uL (ref 4.0–10.5)
nRBC: 0 % (ref 0.0–0.2)

## 2022-04-16 LAB — CBG MONITORING, ED: Glucose-Capillary: 295 mg/dL — ABNORMAL HIGH (ref 70–99)

## 2022-04-16 NOTE — ED Triage Notes (Addendum)
Pt arrives from home via [facility]. Pt is blind. Pt is diabetic and her dexcom was reading "high", she took her own insulin around 1800, She has had dizziness, shortness of breath. Initial check was 456, last check 369 just PTA to ED. En route, she was given 448ml fluid, IV established in the forearm. Vitals were 126/72, 66hr, 99% ra. Pt had reported that she was taken off her insulin by her provider for about two weeks and has been seen in the ED recently for high glucose levels

## 2022-04-16 NOTE — ED Provider Triage Note (Signed)
Emergency Medicine Provider Triage Evaluation Note  Amy [name] , a 61 y.o. female  was evaluated in triage.  Pt complains of hyperglycemia.  Patient was seen here yesterday for high blood sugar in the 200s.  She states that it was 400s at home today prompting return to the emergency department.  She states that when her sugars are high she feels anxious and has difficulty thinking.  No other complaints currently.  Transported by EMS.  Review of Systems  Positive: Anxious Negative: Abdominal pain, vomiting  Physical Exam  BP 115/67 (BP Location: Right Arm)   Pulse 68   Temp 98.2 F (36.8 C) (Oral)   Resp 18   Ht 4\' 11"  (1.499 m)   Wt 77.1 kg   SpO2 100%   BMI 34.34 kg/m  Gen:   Awake, no distress   Resp:  Normal effort  MSK:   Moves extremities without difficulty  Other:    Medical Decision Making  Medically screening exam initiated at 8:21 PM.  Appropriate orders placed.  NORENA BRATTON was informed that the remainder of the evaluation will be completed by another provider, this initial triage assessment does not replace that evaluation, and the importance of remaining in the ED until their evaluation is complete.     Carlisle Cater, PA-C 04/16/22 2022

## 2022-04-17 DIAGNOSIS — E1165 Type 2 diabetes mellitus with hyperglycemia: Secondary | ICD-10-CM | POA: Diagnosis not present

## 2022-04-17 LAB — URINALYSIS, ROUTINE W REFLEX MICROSCOPIC
Bilirubin Urine: NEGATIVE
Glucose, UA: >=500 mg/dL — AB
Hgb urine dipstick: NEGATIVE
Ketones, ur: NEGATIVE mg/dL
Leukocytes,Ua: NEGATIVE
Nitrite: NEGATIVE
Specific Gravity, Urine: 1.01 (ref 1.005–1.030)
pH: 6 (ref 5.0–8.0)

## 2022-04-17 LAB — CBG MONITORING, ED
Glucose-Capillary: 260 mg/dL — ABNORMAL HIGH (ref 70–99)
Glucose-Capillary: 291 mg/dL — ABNORMAL HIGH (ref 70–99)
Glucose-Capillary: 318 mg/dL — ABNORMAL HIGH (ref 70–99)

## 2022-04-17 LAB — URINALYSIS, MICROSCOPIC (REFLEX)

## 2022-04-17 MED ORDER — INSULIN ASPART 100 UNIT/ML IJ SOLN
5.0000 [IU] | Freq: Once | INTRAMUSCULAR | Status: AC
  Administered 2022-04-17: 5 [IU] via SUBCUTANEOUS
  Filled 2022-04-17: qty 0.05

## 2022-04-17 MED ORDER — POTASSIUM CHLORIDE CRYS ER 20 MEQ PO TBCR
40.0000 meq | EXTENDED_RELEASE_TABLET | Freq: Once | ORAL | Status: AC
  Administered 2022-04-17: 40 meq via ORAL
  Filled 2022-04-17: qty 2

## 2022-04-17 MED ORDER — SODIUM CHLORIDE 0.9 % IV BOLUS
500.0000 mL | Freq: Once | INTRAVENOUS | Status: AC
  Administered 2022-04-17: 500 mL via INTRAVENOUS

## 2022-04-17 NOTE — ED Provider Notes (Signed)
[facility] DEPT Provider Note   CSN: [id number] Arrival date & time: 04/16/22  2008     History  Chief Complaint  Patient presents with   Hyperglycemia    MARGAUX ENGEN is a 61 y.o. female.  The history is provided by the patient.  Hyperglycemia FEMALE IAFRATE is a 61 y.o. female who presents to the Emergency Department complaining of hyperglycemia.  2 weeks ago her insulin was discontinued by her PCP due to improvement in her A1c.  After discontinuing the insulin her blood sugars have been running high.  Yesterday she restarted her insulin.  She took 8 units of insulin instead of her baseline 6 due to elevated blood sugar.  After taking it she felt dizzy and got nervous because she is blind and was worried about getting confused and disoriented in her apartment.  She lives alone.  She does have a Dexcom but it has not been functioning appropriately.  She does have a backup glucometer but prefers not to use it.  No fever, chest pain, AP, N/V.  Has urinary frequency.no dysuria.     Home Medications Prior to Admission medications   Medication Sig Start Date End Date Taking? Authorizing Provider  albuterol (PROVENTIL HFA;VENTOLIN HFA) 108 (90 BASE) MCG/ACT inhaler Inhale 2 puffs into the lungs every 4 (four) hours as needed for wheezing or shortness of breath. For short    [provider]  allopurinol (ZYLOPRIM) 300 MG tablet Take 300 mg by mouth 2 (two) times daily. 11/23/20   [provider]  aspirin EC 81 MG tablet Take 81 mg by mouth daily.    [provider]  blood glucose meter kit and supplies Dispense based on patient and insurance preference. Use up to four times daily as directed. (FOR ICD-10 E10.9, E11.9). 08/06/20   Wendie Agreste, MD  cetirizine (ZYRTEC) 10 MG tablet Take 10 mg by mouth daily.    [provider]  clotrimazole-betamethasone (LOTRISONE) cream Apply 1 application topically 2 (two) times daily.  07/08/21   McDonald, Stephan Minister, DPM  colchicine 0.6 MG tablet Take by mouth. 12/14/20   [provider]  Continuous Blood Gluc Sensor (DEXCOM G7 SENSOR) MISC 1 Device by Does not apply route as directed. 10/19/21   Shamleffer, Melanie Crazier, MD  Dulaglutide (TRULICITY) 1.5 YQ/6.5HQ SOPN Inject 1.5 mg into the skin once a week. 05/06/21   Shamleffer, Melanie Crazier, MD  EMBRACE TALK GLUCOSE TEST test strip USE TO CHECK BLOOD SUGARS FOUR TIMES A DAY BEFORE MEALS AND AT BEDTIME DX E11.29 07/29/20   Wendie Agreste, MD  esomeprazole (NEXIUM) 40 MG capsule Take 1 capsule (40 mg total) by mouth daily. 07/10/20   Bensimhon, Shaune Pascal, MD  gabapentin (NEURONTIN) 300 MG capsule TAKE 1 CAPSULE (300 MG TOTAL) BY MOUTH 3 (THREE) TIMES DAILY. Patient taking differently: Take 400 mg by mouth 3 (three) times daily. 12/21/20   Hilts, Legrand Como, MD  gabapentin (NEURONTIN) 400 MG capsule SMARTSIG:1 Capsule(s) By Mouth 1-4 Times Daily PRN 08/10/21   [provider]  hydrOXYzine (ATARAX/VISTARIL) 25 MG tablet Take 0.5 tablets (12.5 mg total) by mouth every 8 (eight) hours as needed for itching. 12/05/20   Jaynee Eagles, PA-C  insulin lispro (HUMALOG) 100 UNIT/ML KwikPen MAX DAILY 30 UNITS 01/19/22   Shamleffer, Melanie Crazier, MD  Insulin Pen Needle (PEN NEEDLES) 31G X 8 MM MISC Inject 1 Device into the skin in the morning, at noon, in the evening, and at bedtime.  01/19/22   Shamleffer, Melanie Crazier, MD  levothyroxine (SYNTHROID) 150 MCG tablet Take 1 tablet (150 mcg total) by mouth daily. Needs appt for future refills 08/06/20   Wendie Agreste, MD  linaclotide [facility]) 145 MCG CAPS capsule Take 145 mcg by mouth daily as needed (constipation).     [provider]  naproxen (NAPROSYN) 500 MG tablet Take 500 mg by mouth 2 (two) times daily with a meal.    [provider]  nitrofurantoin, macrocrystal-monohydrate, (MACROBID) 100 MG capsule Take 1 capsule (100 mg total) by mouth 2 (two) times daily.  11/06/21   Nyoka Lint, PA-C  olmesartan (BENICAR) 20 MG tablet Take 10 mg by mouth daily. 04/26/21   [provider]  ondansetron (ZOFRAN ODT) 4 MG disintegrating tablet Take 1 tablet (4 mg total) by mouth every 8 (eight) hours as needed for nausea or vomiting. 07/09/19   Antonietta Breach, PA-C  potassium chloride SA (KLOR-CON) 20 MEQ tablet Take 1 tablet (20 mEq total) by mouth daily. Need appt for future refills 08/06/20   Wendie Agreste, MD  Prodigy Twist Top Lancets 28G MISC USE TO CHECK BLOOD SUGARS 10/02/20   [provider]  simvastatin (ZOCOR) 20 MG tablet Take 1 tablet (20 mg total) by mouth every evening. 08/06/20   Wendie Agreste, MD  spironolactone (ALDACTONE) 25 MG tablet Take 0.5 tablets (12.5 mg total) by mouth daily. Needs appt for future refills 08/06/20   Wendie Agreste, MD  tiZANidine (ZANAFLEX) 2 MG tablet Take 2 mg by mouth 2 (two) times daily. 10/02/20   [provider]  torsemide (DEMADEX) 20 MG tablet Take 4 tablets (80 mg total) by mouth daily. Need appt for future refills 08/06/20   Wendie Agreste, MD  TRESIBA FLEXTOUCH 200 UNIT/ML FlexTouch Pen Inject 12 Units into the skin daily at 6 (six) AM. 03/29/22   Shamleffer, Melanie Crazier, MD      Allergies    Penicillins, Tramadol, Vicodin [hydrocodone-acetaminophen], Codeine, Iodine-131, Iohexol, Lisinopril, and Sulfa antibiotics    Review of Systems   Review of Systems  All other systems reviewed and are negative.   Physical Exam Updated Vital Signs BP (!) 144/70   Pulse 63   Temp 97.7 F (36.5 C) (Oral)   Resp 18   Ht _0  (1.499 m)   Wt 77.1 kg   SpO2 99%   BMI 34.34 kg/m  Physical Exam Vitals and nursing note reviewed.  Constitutional:      Appearance: She is well-developed.  HENT:     Head: Normocephalic and atraumatic.  Cardiovascular:     Rate and Rhythm: Normal rate and regular rhythm.     Heart sounds: No murmur heard. Pulmonary:     Effort: Pulmonary effort is  normal. No respiratory distress.     Breath sounds: Normal breath sounds.  Abdominal:     Palpations: Abdomen is soft.     Tenderness: There is no abdominal tenderness. There is no guarding or rebound.  Musculoskeletal:        General: No tenderness.  Skin:    General: Skin is warm and dry.  Neurological:     Mental Status: She is alert and oriented to person, place, and time.  Psychiatric:        Behavior: Behavior normal.     ED Results / Procedures / Treatments   Labs (all labs ordered are listed, but only abnormal results are displayed) Labs Reviewed  CBC WITH DIFFERENTIAL/PLATELET - Abnormal; Notable  for the following components:      Result Value   Hemoglobin 11.5 (*)    All other components within normal limits  BASIC METABOLIC PANEL - Abnormal; Notable for the following components:   Potassium 3.1 (*)    Chloride 112 (*)    CO2 19 (*)    Glucose, Bld 277 (*)    BUN 25 (*)    Creatinine, Ser 1.68 (*)    Calcium 8.1 (*)    GFR, Estimated 34 (*)    All other components within normal limits  URINALYSIS, ROUTINE W REFLEX MICROSCOPIC - Abnormal; Notable for the following components:   Glucose, UA >=500 (*)    Protein, ur TRACE (*)    All other components within normal limits  URINALYSIS, MICROSCOPIC (REFLEX) - Abnormal; Notable for the following components:   Bacteria, UA RARE (*)    All other components within normal limits  CBG MONITORING, ED - Abnormal; Notable for the following components:   Glucose-Capillary 295 (*)    All other components within normal limits  CBG MONITORING, ED - Abnormal; Notable for the following components:   Glucose-Capillary 291 (*)    All other components within normal limits  CBG MONITORING, ED - Abnormal; Notable for the following components:   Glucose-Capillary 318 (*)    All other components within normal limits  CBG MONITORING, ED - Abnormal; Notable for the following components:   Glucose-Capillary 260 (*)    All other  components within normal limits    EKG None  Radiology No results found.  Procedures Procedures    Medications Ordered in ED Medications  sodium chloride 0.9 % bolus 500 mL (0 mLs Intravenous Stopped 04/17/22 0703)  potassium chloride SA (KLOR-CON M) CR tablet 40 mEq (40 mEq Oral Given 04/17/22 0325)  insulin aspart (novoLOG) injection 5 Units (5 Units Subcutaneous Given 04/17/22 0536)    ED Course/ Medical Decision Making/ A&P                           Medical Decision Making Risk Prescription drug management.   Patient with history of diabetes, CKD here for evaluation of hyperglycemia.  She does have hyperglycemia but no evidence of DKA.  She is well-hydrated and nontoxic-appearing on evaluation.  BMP with stable renal insufficiency.  No anion gap.  Her bicarb is mildly decreased.  No evidence of acute infectious process.  No foot infection.  UA is not consistent with UTI.  Her blood sugars did improve during her ED stay.  Feel she is stable to continue her home insulin, which was just restarted yesterday with endocrine follow-up.  Return precautions discussed.        Final Clinical Impression(s) / ED Diagnoses Final diagnoses:  Hyperglycemia    Rx / DC Orders ED Discharge Orders     None         Quintella Reichert, MD 04/17/22 [phone]

## 2022-04-17 NOTE — ED Notes (Signed)
Pt's friend [name] called and will pick pt up.

## 2022-04-20 ENCOUNTER — Telehealth: Payer: Self-pay | Admitting: Nutrition

## 2022-04-20 ENCOUNTER — Encounter: Payer: Self-pay | Admitting: Neurology

## 2022-04-20 ENCOUNTER — Ambulatory Visit (INDEPENDENT_AMBULATORY_CARE_PROVIDER_SITE_OTHER): Payer: Medicare Other | Admitting: Neurology

## 2022-04-20 VITALS — BP 97/63 | HR 67 | Ht 59.0 in | Wt 172.2 lb

## 2022-04-20 DIAGNOSIS — G4733 Obstructive sleep apnea (adult) (pediatric): Secondary | ICD-10-CM | POA: Diagnosis not present

## 2022-04-20 NOTE — Progress Notes (Signed)
Subjective:    Patient ID: Amy [name] is a 61 y.o. female.  HPI    Interim history:   [name] is a 61 year old right-handed woman with an underlying medical history of diabetes, blindness (with L prosthetic eye and R eye blindness d/t diabetes), hyperlipidemia, asthma, arthritis, spinal stenosis, thyroid disease, nephropathy, orthostatic hypotension deemed secondary to diabetic autonomic neuropathy (seen by Dr. Leta Baptist in 9/17), and obesity, who presents for follow up consultation of her sleep apnea, after recent sleep study testing and starting treatment with a new CPAP machine.  The patient is accompanied by her aide today.  I last saw her on 12/29/2016 at which time she was mildly suboptimal with her CPAP.  She had a split-night sleep study on 12/21/2021 which showed a baseline AHI of 79.1/h, O2 nadir 50%.  She had good response to CPAP therapy of 10 cm.  Her AHI was reduced to 1.9/h with O2 nadir of 93% but non-REM sleep achieved on this pressure. Based on her test results I prescribed a new CPAP machine.  Her set up date was 03/04/2022. She has a ResMed air sense 11 AutoSet machine.  Today, 04/20/2022: I reviewed her CPAP compliance data from 03/20/2022 through 04/18/2022, which is a total of 30 days, during which time she used her machine 29 days with percent use days greater than 4 hours at 80%, indicating very good compliance with an average usage of 5 hours and 46 minutes, residual AHI borderline elevated at 6.7/h, leak on the low side with the 95th percentile at 4.6 L/min on a pressure of 10 cm with EPR of 3.  She reports doing fairly well with her new machine.  She likes her new machine but the water chamber is smaller and sometimes in the middle of the night she runs out of water.  She does have some mouth dryness, uses a fullface mask and is noticing improvement in her daytime somnolence.  She does not sleep all that well and has sleep disruption.  She is motivated to continue with  treatment at this time.  Previously:  She saw Debbora Presto, NP on 11/09/2021 at which time the patient reported having difficulty getting supplies.  She was due for a new machine.  A sleep study was ordered.   She saw Debbora Presto, NP in follow-up on 10/09/2019, at which time she reported difficulty tolerating her CPAP.  She was compliant with treatment but felt that the pressure was too high.  She had congestion with allergy symptoms at the time.  She saw Cecille Rubin, NP on 09/06/2017, at which time she was compliant with her CPAP.  She saw Cecille Rubin, NP on 02/09/2017, at which time she was very compliant with her CPAP.  Debbora Presto, NP in subsequent appointments  I first met her on 09/22/16, at the request of her PCP, at which time she reported snoring and excessive daytime somnolence. I invited her for a sleep study. She had a baseline sleep study, followed by a CPAP titration study. I went over her test results with her in detail today. Baseline sleep study from 10/02/2016 showed a sleep efficiency of 84.8%, sleep latency of 18.5 minutes, REM latency of 72.5 minutes, she had absence of slow-wave sleep, an increased percentage of stage II sleep, REM sleep at 16.8%. Total AHI was 6.8 per hour, REM AHI 28 per hour, supine sleep was nearly absent. Average oxygen saturation was 95%, nadir was 78%, time below 89% saturation of 24 minutes. Based on  her sleep-related complaints and significant desaturations noted during REM sleep I suggested she return for a full night CPAP titration study. She had this on 10/18/2016, sleep efficiency was 85.4%, sleep latency of 34 minutes, REM latency was 194 minutes. She had slow-wave sleep at 7.8%, REM sleep at 12.1%. She was fitted with medium nasal pillows and CPAP was titrated from 5 cm to 9 cm. On the final pressure her AHI was 0 per hour, nonsupine REM sleep was achieved and alternated was 86%. I suggested a home CPAP pressure of 10 cm.   I reviewed her CPAP compliance  data from 11/28/16 to 12/27/16, which is a total of 30 days, during which time she used her CPAP 26 days, with percent used days greater than 4 hours of 63%, indicating suboptimal compliance, average usage of 5 hours and 27 minutes, average AHI of 3/hour, leak low with the 95th percentile of 2.8 lpm on a pressure of 10 cm with EPR of 3. She reports feeling better, not as sleepy during the day. Generally speaking, she is tolerating the mask and the pressure but sometimes the head your slides off. She had a couple of nights where she was traveling, another time, there was something wrong with her machine and [employer] came back out to look at it. She has not changed her filter yet. She has not been advised how to wash the nasal pillows. The caretaker she had at the time left and she has a new caretaker now. She may need additional reeducation as far as maintaining her CPAP equipment and changing the supplies goes. She requests that [employer] come back out between the hours of 8 AM and 2 PM so her caretaker can understand how to change supplies. Of note, she presented to the emergency room on 09/27/2016 with pain after a recent fall.   Previously (copied from previous notes for reference):    09/22/16: (She) reports snoring and excessive daytime somnolence as well as breathing pauses while asleep. Her Epworth sleepiness score is 14 out of 24, fatigue score is 60 out of 63. She is single, she does not work. She lives alone. She has 2 grown children. She quit smoking in 1996, and does not currently drink alcohol. She drinks caffeine in the form of coffee, usually 2 cups in the mornings. She does not drink sodas or tea on a daily basis.  She has a family history of obstructive sleep apnea in her sister, maternal aunt, and one cousin. The patient has been witnessed to have breathing pauses while asleep by her aide. She has an aide typically daily from 8 to 2 PM. She has nocturia about twice per average night and has had  occasional morning headaches. Bedtime is around 10 PM and wakeup time around 8 AM. She has had lower extremity swelling. She tries to elevate her legs and sometimes uses compression socks. She has tingling in her legs. She takes Lyrica. I reviewed your office note from 08/01/2016, which you kindly included.   Her Past Medical History Is Significant For: Past Medical History:  Diagnosis Date   Arthritis    Asthma    Blind    Blindness and low vision    left eye glass eye,  legally blind in right eye   Diabetes mellitus    Diabetic neuropathy ([place])    Hyperlipidemia    Hypertension    Hypothyroidism    Intractable nausea and vomiting 03/06/2018   Spinal stenosis     Her Past  Surgical History Is Significant For: Past Surgical History:  Procedure Laterality Date   ABDOMINAL HYSTERECTOMY  [place]   x 2   CHOLECYSTECTOMY  2008   ENUCLEATION Bilateral 09/15/1998   EYE SURGERY  2016   fitting artificial eye    Her Family History Is Significant For: Family History  Problem Relation Age of Onset   Diabetes Sister    Glaucoma Sister    Hypertension Sister    Healthy Mother    Healthy Father    Stroke Brother    Heart attack Paternal Aunt    Breast cancer Neg Hx     Her Social History Is Significant For: Social History   Socioeconomic History   Marital status: Single    Spouse name: Not on file   Number of children: 2   Years of education: 12   Highest education level: Not on file  Occupational History   Occupation: N/A    Comment: disabled  Tobacco Use   Smoking status: Former    Types: Cigarettes    Quit date: 05/22/1992    Years since quitting: 29.9   Smokeless tobacco: Never  Vaping Use   Vaping Use: Never used  Substance and Sexual Activity   Alcohol use: No    Comment: quit 20 yrs ago   Drug use: No   Sexual activity: Not Currently  Other Topics Concern   Not on file  Social History Narrative   Lives alone   caffeine  drinks 2 cups of coffee a day, occasional soda     Right Handed   Social Determinants of Health   Financial Resource Strain: Medium Risk (10/02/2019)   Overall Financial Resource Strain (CARDIA)    Difficulty of Paying Living Expenses: Somewhat hard  Food Insecurity: No Food Insecurity (10/02/2019)   Hunger Vital Sign    Worried About Running Out of Food in the Last Year: Never true    [place] in the Last Year: Never true  Transportation Needs: No Transportation Needs (05/29/2019)   PRAPARE - Hydrologist (Medical): No    Lack of Transportation (Non-Medical): No  Physical Activity: Not on file  Stress: Not on file  Social Connections: Not on file    Her Allergies Are:  Allergies  Allergen Reactions   Penicillins Hives and Swelling    Has patient had a PCN reaction causing immediate rash, facial/tongue/throat swelling, SOB or lightheadedness with hypotension: yes- face swelling Has patient had a PCN reaction causing severe rash involving mucus membranes or skin necrosis: no Has patient had a PCN reaction that required hospitalization unknown (childhood allergy) Has patient had a PCN reaction occurring within the last 10 years: no If all of the above answers are "NO", then may proceed with Cephalosporin use.    Tramadol Nausea And Vomiting    Intense nausea   Vicodin [Hydrocodone-Acetaminophen] Nausea And Vomiting   Codeine Nausea Only    Other reaction(s): Unknown   Iodine-131 Hives   Iohexol Hives    Pt developed itching and hives along with nasal congestion; needs 13 hour premeds for future studies, Onset Date: [id number]    Lisinopril Cough    Other reaction(s): Unknown   Sulfa Antibiotics Nausea And Vomiting  :   Her Current Medications Are:  Outpatient Encounter Medications as of 04/20/2022  Medication Sig   albuterol (PROVENTIL HFA;VENTOLIN HFA) 108 (90 BASE) MCG/ACT inhaler Inhale 2 puffs into the lungs every  4 (four) hours as needed  for wheezing or shortness of breath. For short   aspirin EC 81 MG tablet Take 81 mg by mouth daily.   blood glucose meter kit and supplies Dispense based on patient and insurance preference. Use up to four times daily as directed. (FOR ICD-10 E10.9, E11.9).   cetirizine (ZYRTEC) 10 MG tablet Take 10 mg by mouth daily.   clotrimazole-betamethasone (LOTRISONE) cream Apply 1 application topically 2 (two) times daily.   Dulaglutide (TRULICITY) 1.5 HL/4.5GY SOPN Inject 1.5 mg into the skin once a week.   EMBRACE TALK GLUCOSE TEST test strip USE TO CHECK BLOOD SUGARS FOUR TIMES A DAY BEFORE MEALS AND AT BEDTIME DX E11.29   esomeprazole (NEXIUM) 40 MG capsule Take 1 capsule (40 mg total) by mouth daily.   gabapentin (NEURONTIN) 400 MG capsule SMARTSIG:1 Capsule(s) By Mouth 1-4 Times Daily PRN   hydrOXYzine (ATARAX/VISTARIL) 25 MG tablet Take 0.5 tablets (12.5 mg total) by mouth every 8 (eight) hours as needed for itching.   Insulin Pen Needle (PEN NEEDLES) 31G X 8 MM MISC Inject 1 Device into the skin in the morning, at noon, in the evening, and at bedtime.   levothyroxine (SYNTHROID) 150 MCG tablet Take 1 tablet (150 mcg total) by mouth daily. Needs appt for future refills   linaclotide (LINZESS) 145 MCG CAPS capsule Take 145 mcg by mouth daily as needed (constipation).    naproxen (NAPROSYN) 500 MG tablet Take 500 mg by mouth 2 (two) times daily with a meal.   potassium chloride SA (KLOR-CON) 20 MEQ tablet Take 1 tablet (20 mEq total) by mouth daily. Need appt for future refills   Prodigy Twist Top Lancets 28G MISC USE TO CHECK BLOOD SUGARS   simvastatin (ZOCOR) 20 MG tablet Take 1 tablet (20 mg total) by mouth every evening.   tiZANidine (ZANAFLEX) 2 MG tablet Take 2 mg by mouth 2 (two) times daily.   TRESIBA FLEXTOUCH 200 UNIT/ML FlexTouch Pen Inject 12 Units into the skin daily at 6 (six) AM.   allopurinol (ZYLOPRIM) 300 MG tablet Take 300 mg by mouth 2 (two) times daily.   colchicine 0.6 MG tablet  Take by mouth.   Continuous Blood Gluc Sensor (DEXCOM G7 SENSOR) MISC 1 Device by Does not apply route as directed. (Patient not taking: Reported on 04/20/2022)   gabapentin (NEURONTIN) 300 MG capsule TAKE 1 CAPSULE (300 MG TOTAL) BY MOUTH 3 (THREE) TIMES DAILY. (Patient not taking: Reported on 04/20/2022)   insulin lispro (HUMALOG) 100 UNIT/ML KwikPen MAX DAILY 30 UNITS   nitrofurantoin, macrocrystal-monohydrate, (MACROBID) 100 MG capsule Take 1 capsule (100 mg total) by mouth 2 (two) times daily.   olmesartan (BENICAR) 20 MG tablet Take 10 mg by mouth daily.   ondansetron (ZOFRAN ODT) 4 MG disintegrating tablet Take 1 tablet (4 mg total) by mouth every 8 (eight) hours as needed for nausea or vomiting. (Patient not taking: Reported on 04/20/2022)   spironolactone (ALDACTONE) 25 MG tablet Take 0.5 tablets (12.5 mg total) by mouth daily. Needs appt for future refills   torsemide (DEMADEX) 20 MG tablet Take 4 tablets (80 mg total) by mouth daily. Need appt for future refills   No facility-administered encounter medications on file as of 04/20/2022.  :  Review of Systems:  Out of a complete 14 point review of systems, all are reviewed and negative with the exception of these symptoms as listed below:  Review of Systems  Neurological:        Pt  states that she has to get up 2x per night to refill the water in CPAP Machine. Pt states that her sleeping is better. Pt states that her mouth gets dry with CPAP. Pt states that her tonsils are swollen due to her using her CPAP Machine.     Objective:  Neurological Exam  Physical Exam Physical Examination:   Vitals:   04/20/22 0957  BP: 97/63  Pulse: 67    General Examination: The patient is a very pleasant 61 y.o. female in no acute distress. She appears well-developed and well-nourished and well groomed.   HEENT: Normocephalic, atraumatic, dark glasses in place. Extraocular tracking is not possible to be tested. Hearing is grossly intact. Face is  symmetric with normal facial animation and normal facial sensation. Speech is clear with no dysarthria noted. There is no hypophonia. There is no lip, neck/head, jaw or voice tremor. Oropharynx exam reveals: mild mouth dryness, edentulous, marked airway crowding.  Mallampati class IV.  Tongue protrudes centrally and palate elevates symmetrically.    Chest: Clear to auscultation without wheezing, rhonchi or crackles noted.   Heart: S1+S2+0, regular and normal without murmurs, rubs or gallops noted.    Abdomen: Soft, non-tender and non-distended.   Extremities: There is trace edema in the distal lower extremities bilaterally.    Skin: Warm and dry without trophic changes noted.    Musculoskeletal: exam reveals no obvious joint deformities.    Neurologically:  Mental status: The patient is awake, alert and oriented in all 4 spheres. Her immediate and remote memory, attention, language skills and fund of knowledge are appropriate. There is no evidence of aphasia, agnosia, apraxia or anomia. Speech is clear with normal prosody and enunciation. Thought process is linear. Mood is normal and affect is normal.  Cranial nerves II - XII are as described above under HEENT exam. Motor exam: Normal bulk, strength and tone is noted. There is no obvious tremor.  Fine motor skills and coordination: globally mildly impaired.  Cerebellar testing: No obvious dysmetria or intention tremor. There is no truncal or gait ataxia.  Sensory exam: intact to light touch.  Gait, station and balance: She stands with difficulty. No veering to one side is noted.  She uses a walker and walks with her Aide.     Assessment and Plan:  In summary, KIMIYA BRUNELLE is a very pleasant 61 year old right-handed woman with an underlying medical history of diabetes, blindness (with L prosthetic eye and R eye blindness d/t diabetes), hyperlipidemia, asthma, arthritis, spinal stenosis, thyroid disease, nephropathy, orthostatic hypotension  deemed secondary to diabetic autonomic neuropathy (seen by Dr. Leta Baptist in 9/17), and obesity, who presents for follow up consultation of her sleep apnea, after recent sleep study testing and starting treatment with a new CPAP machine.  She had a split-night sleep study in August 2023 which showed severe obstructive sleep apnea.  She did well with CPAP of 10 cm.  She is compliant with treatment and reports benefit, does have some mouth dryness.  She may need help to fill the humidifier chamber to the max line as she cannot see.  She is commended for her treatment adherence.  Prior sleep testing in 2018 showed overall mild sleep apnea, with significant predominance and REM sleep.  She had a prior titration study in June 2018 and has done well with CPAP of 10 cm. She is motivated to continue with treatment.  She is advised to follow-up routinely in 1 year to see one of our nurse practitioners.  I answered all her questions today and she was in agreement with our plan.   I spent 30 minutes in total face-to-face time and in reviewing records during pre-charting, more than 50% of which was spent in counseling and coordination of care, reviewing test results, reviewing medications and treatment regimen and/or in discussing or reviewing the diagnosis of OSA, the prognosis and treatment options. Pertinent laboratory and imaging test results that were available during this visit with the patient were reviewed by me and considered in my medical decision making (see chart for details).

## 2022-04-20 NOTE — Telephone Encounter (Signed)
Patient reports that her phone no longer is working. She is trying to get a new phone.  She was told to call me when she gets one and I will set up her app and train her on the use of the sensors again.

## 2022-04-20 NOTE — Patient Instructions (Signed)
Verbal instructions given: continue CPAP at the current settings. We can see you in 1 year, you can see one of our nurse practitioners.

## 2022-04-27 ENCOUNTER — Ambulatory Visit: Payer: Medicare Other | Admitting: Podiatry

## 2022-05-03 ENCOUNTER — Ambulatory Visit (INDEPENDENT_AMBULATORY_CARE_PROVIDER_SITE_OTHER): Payer: Medicare Other | Admitting: Podiatry

## 2022-05-03 VITALS — BP 113/60

## 2022-05-03 DIAGNOSIS — M2011 Hallux valgus (acquired), right foot: Secondary | ICD-10-CM | POA: Diagnosis not present

## 2022-05-03 DIAGNOSIS — B351 Tinea unguium: Secondary | ICD-10-CM | POA: Diagnosis not present

## 2022-05-03 DIAGNOSIS — E1142 Type 2 diabetes mellitus with diabetic polyneuropathy: Secondary | ICD-10-CM

## 2022-05-03 DIAGNOSIS — L84 Corns and callosities: Secondary | ICD-10-CM | POA: Diagnosis not present

## 2022-05-03 DIAGNOSIS — M2042 Other hammer toe(s) (acquired), left foot: Secondary | ICD-10-CM

## 2022-05-03 DIAGNOSIS — E119 Type 2 diabetes mellitus without complications: Secondary | ICD-10-CM | POA: Diagnosis not present

## 2022-05-03 DIAGNOSIS — M79676 Pain in unspecified toe(s): Secondary | ICD-10-CM | POA: Diagnosis not present

## 2022-05-03 DIAGNOSIS — M2041 Other hammer toe(s) (acquired), right foot: Secondary | ICD-10-CM

## 2022-05-03 DIAGNOSIS — M2012 Hallux valgus (acquired), left foot: Secondary | ICD-10-CM

## 2022-05-03 NOTE — Patient Instructions (Signed)
Apply O'Keefe's Healthy Feet Cream to feet once before bedtime.

## 2022-05-03 NOTE — Progress Notes (Unsigned)
ANNUAL DIABETIC FOOT EXAM  Subjective: Amy [name] presents today for annual diabetic foot examination. She is accompanied by her caregiver on today's visit. Patient is blind. She is accompanied by her caregiver on today's visit. Chief Complaint  Patient presents with   Nail Problem    [facility] BS-139 A1C-6.2 PCP-Merino Lenice Llamas PCP VST-2 weeks ago   Patient confirms h/o diabetes.  Patient has h/o pressure injury of heels.  Patient has been diagnosed with neuropathy and it is managed with gabapentin.  Risk factors: diabetes, diabetic neuropathy, HTN, CHF, hyperlipidemia, h/o tobacco use in remission.  [facility] is patient's PCP.  Past Medical History:  Diagnosis Date   Arthritis    Asthma    Blind    Blindness and low vision    left eye glass eye,  legally blind in right eye   Diabetes mellitus    Diabetic neuropathy ([place])    Hyperlipidemia    Hypertension    Hypothyroidism    Intractable nausea and vomiting 03/06/2018   Spinal stenosis    Patient Active Problem List   Diagnosis Date Noted   Nasal dryness 06/22/2021   Dysphagia 04/07/2021   Gastro-esophageal reflux disease without esophagitis 04/07/2021   Personal history of colonic polyps 04/07/2021   Bilateral impacted cerumen 02/25/2021   Sensorineural hearing loss (SNHL), bilateral 02/25/2021   Type 2 diabetes mellitus with diabetic polyneuropathy, with long-term current use of insulin ([place]) 98/03/9146   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/29/2020   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, with long-term current use of insulin ([place]) 06/29/2020   Type 2 diabetes mellitus with hyperglycemia, with long-term current use of insulin ([place]) 06/29/2020   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 03/31/2020   Pre-operative cardiovascular examination 02/18/2019   Septic bursitis  of elbow, right 10/30/2018   CRI (chronic renal insufficiency), stage 3 (moderate) 07/26/2018   Morbid obesity ([place]) 07/26/2018   Intractable nausea and vomiting 03/06/2018   Right leg pain 12/19/2017   Leukocytosis 12/19/2017   Benign positional vertigo 12/19/2017   Weakness 12/12/2017   Laryngopharyngeal reflux (LPR) 10/19/2017   Post-nasal drainage 10/19/2017   Acute sinusitis 10/19/2017   Degeneration of lumbar intervertebral disc 07/29/2017   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, left 03/08/2017   Obstructive sleep apnea on CPAP 02/09/2017   Chronic diastolic heart failure ([place]) 11/08/2016   Near syncope 01/30/2016   SOB (shortness of breath)    CAP (community acquired pneumonia) 09/12/2015   Hypoxia 09/12/2015   Essential hypertension 07/05/2015   Hypotension 07/05/2015   Diabetes mellitus with neurological manifestations ([place]) 11/04/2014   Hyperlipidemia LDL goal <70 11/04/2014   Spinal stenosis, multilevel 11/04/2014   Hyperkalemia 11/01/2014   Hypoglycemia 08/09/2014   Type 2 diabetes mellitus with complication, with long-term current use of insulin ([place]) 08/08/2014   Elevated troponin 08/08/2014   Nausea vomiting and diarrhea 08/08/2014   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   Dizziness 08/06/2012   Orthostatic hypotension 08/06/2012   Hypernatremia 08/06/2012   Acute gastroenteritis 08/13/2011   Gastroparesis 08/13/2011   Low back pain 08/13/2011   Oral thrush 08/13/2011   Gastroenteritis 05/23/2011   Dehydration 05/23/2011   Blindness 05/23/2011   DM type 1, not at goal, causing eye disease ([place]) 05/23/2011   Asthma 05/23/2011  Diarrhea 05/23/2011   Vomiting 05/23/2011   Hypothyroidism 05/23/2011   Diabetic neuropathy ([employer]) 05/23/2011   Diabetic nephropathy ([place]) 05/23/2011   Past Surgical History:  Procedure Laterality Date    ABDOMINAL HYSTERECTOMY  [place]   x 2   CHOLECYSTECTOMY  2008   ENUCLEATION Bilateral 09/15/1998   EYE SURGERY  2016   fitting artificial eye   Current Outpatient Medications on File Prior to Visit  Medication Sig Dispense Refill   albuterol (PROVENTIL HFA;VENTOLIN HFA) 108 (90 BASE) MCG/ACT inhaler Inhale 2 puffs into the lungs every 4 (four) hours as needed for wheezing or shortness of breath. For short     allopurinol (ZYLOPRIM) 300 MG tablet Take 300 mg by mouth 2 (two) times daily.     aspirin EC 81 MG tablet Take 81 mg by mouth daily.     blood glucose meter kit and supplies Dispense based on patient and insurance preference. Use up to four times daily as directed. (FOR ICD-10 E10.9, E11.9). 1 each 0   cetirizine (ZYRTEC) 10 MG tablet Take 10 mg by mouth daily.     clotrimazole-betamethasone (LOTRISONE) cream Apply 1 application topically 2 (two) times daily. 30 g 0   colchicine 0.6 MG tablet Take by mouth.     Continuous Blood Gluc Sensor (DEXCOM G7 SENSOR) MISC 1 Device by Does not apply route as directed. (Patient not taking: Reported on 04/20/2022) 9 each 3   Dulaglutide (TRULICITY) 1.5 ZS/0.1UX SOPN Inject 1.5 mg into the skin once a week. 6 mL 2   EMBRACE TALK GLUCOSE TEST test strip USE TO CHECK BLOOD SUGARS FOUR TIMES A DAY BEFORE MEALS AND AT BEDTIME DX E11.29 300 strip 3   esomeprazole (NEXIUM) 40 MG capsule Take 1 capsule (40 mg total) by mouth daily. 30 capsule 0   gabapentin (NEURONTIN) 300 MG capsule TAKE 1 CAPSULE (300 MG TOTAL) BY MOUTH 3 (THREE) TIMES DAILY. (Patient not taking: Reported on 04/20/2022) 90 capsule 3   gabapentin (NEURONTIN) 400 MG capsule SMARTSIG:1 Capsule(s) By Mouth 1-4 Times Daily PRN     hydrOXYzine (ATARAX/VISTARIL) 25 MG tablet Take 0.5 tablets (12.5 mg total) by mouth every 8 (eight) hours as needed for itching. 30 tablet 0   insulin lispro (HUMALOG) 100 UNIT/ML KwikPen MAX DAILY 30 UNITS 30 mL 3   Insulin Pen Needle  (PEN NEEDLES) 31G X 8 MM MISC Inject 1 Device into the skin in the morning, at noon, in the evening, and at bedtime. 400 each 3   levothyroxine (SYNTHROID) 150 MCG tablet Take 1 tablet (150 mcg total) by mouth daily. Needs appt for future refills 90 tablet 0   linaclotide (LINZESS) 145 MCG CAPS capsule Take 145 mcg by mouth daily as needed (constipation).      naproxen (NAPROSYN) 500 MG tablet Take 500 mg by mouth 2 (two) times daily with a meal.     nitrofurantoin, macrocrystal-monohydrate, (MACROBID) 100 MG capsule Take 1 capsule (100 mg total) by mouth 2 (two) times daily. 10 capsule 0   olmesartan (BENICAR) 20 MG tablet Take 10 mg by mouth daily.     ondansetron (ZOFRAN ODT) 4 MG disintegrating tablet Take 1 tablet (4 mg total) by mouth every 8 (eight) hours as needed for nausea or vomiting. (Patient not taking: Reported on 04/20/2022) 10 tablet 0   potassium chloride SA (KLOR-CON) 20 MEQ tablet Take 1 tablet (20 mEq total) by mouth daily. Need appt for future refills 90 tablet 0  Prodigy Twist Top Lancets 28G MISC USE TO CHECK BLOOD SUGARS     simvastatin (ZOCOR) 20 MG tablet Take 1 tablet (20 mg total) by mouth every evening. 90 tablet 0   spironolactone (ALDACTONE) 25 MG tablet Take 0.5 tablets (12.5 mg total) by mouth daily. Needs appt for future refills 15 tablet 0   tiZANidine (ZANAFLEX) 2 MG tablet Take 2 mg by mouth 2 (two) times daily.     torsemide (DEMADEX) 20 MG tablet Take 4 tablets (80 mg total) by mouth daily. Need appt for future refills 120 tablet 0   TRESIBA FLEXTOUCH 200 UNIT/ML FlexTouch Pen Inject 12 Units into the skin daily at 6 (six) AM. 30 mL 2   No current facility-administered medications on file prior to visit.    Allergies  Allergen Reactions   Penicillins Hives and Swelling    Has patient had a PCN reaction causing immediate rash, facial/tongue/throat swelling, SOB or lightheadedness with hypotension: yes- face swelling Has patient had a PCN reaction causing  severe rash involving mucus membranes or skin necrosis: no Has patient had a PCN reaction that required hospitalization unknown (childhood allergy) Has patient had a PCN reaction occurring within the last 10 years: no If all of the above answers are "NO", then may proceed with Cephalosporin use.    Tramadol Nausea And Vomiting    Intense nausea   Vicodin [Hydrocodone-Acetaminophen] Nausea And Vomiting   Codeine Nausea Only    Other reaction(s): Unknown   Iodine-131 Hives   Iohexol Hives    Pt developed itching and hives along with nasal congestion; needs 13 hour premeds for future studies, Onset Date: [id number]    Lisinopril Cough    Other reaction(s): Unknown   Sulfa Antibiotics Nausea And Vomiting   Social History   Occupational History   Occupation: N/A    Comment: disabled  Tobacco Use   Smoking status: Former    Types: Cigarettes    Quit date: 05/22/1992    Years since quitting: 29.9   Smokeless tobacco: Never  Vaping Use   Vaping Use: Never used  Substance and Sexual Activity   Alcohol use: No    Comment: quit 20 yrs ago   Drug use: No   Sexual activity: Not Currently   Family History  Problem Relation Age of Onset   Diabetes Sister    Glaucoma Sister    Hypertension Sister    Healthy Mother    Healthy Father    Stroke Brother    Heart attack Paternal Aunt    Breast cancer Neg Hx    Immunization History  Administered Date(s) Administered   Influenza,inj,Quad PF,6+ Mos 03/04/2013, 01/31/2016   Influenza-Unspecified 02/14/2020   PFIZER(Purple Top)SARS-COV-2 Vaccination 07/29/2019, 08/19/2019, 03/06/2020   Pneumococcal Polysaccharide-23 01/31/2016    Review of Systems: Negative except as noted in the HPI.   Objective: Vitals:   05/03/22 0847  BP: 113/60   TAYVIA FAUGHNAN is a pleasant 61 y.o. female in NAD. AAO X 3.  Vascular Examination: CFT <3 seconds b/l. DP/PT pulses faintly palpable b/l. Skin temperature gradient warm to warm b/l. No pain with calf  compression. No ischemia or gangrene. No cyanosis or clubbing noted b/l. Trace edema noted BLE.   Neurological Examination: Sensation grossly intact b/l with 10 gram monofilament. Vibratory sensation intact b/l. Pt has subjective symptoms of neuropathy.  Dermatological Examination: Pedal skin warm and supple b/l. Toenails 1-5 b/l thick, discolored, elongated with subungual debris and pain on dorsal palpation.  No open  wounds b/l LE. No interdigital macerations noted b/l LE. Hyperkeratotic lesion(s) bilateral heels.  No erythema, no edema, no drainage, no fluctuance.  Musculoskeletal Examination: Muscle strength 5/5 to b/l LE. HAV with bunion deformity noted b/l LE. Hammertoe deformity noted 2-5 b/l. Utilizes rollator for ambulation assistance.  Radiographs: None  Last A1c:      Latest Ref Rng & Units 01/19/2022   10:32 AM 07/21/2021    9:52 AM  Hemoglobin A1C  Hemoglobin-A1c 4.0 - 5.6 % 7.4  6.6    Footwear Assessment: Does the patient wear appropriate shoes? Yes. Does the patient need inserts/orthotics? Yes.  ADA Risk Categorization: Low Risk :  Patient has all of the following: Intact protective sensation No prior foot ulcer  No severe deformity Pedal pulses present  Assessment: 1. Pain due to onychomycosis of toenail   2. Callus   3. Hallux valgus, acquired, bilateral   4. Acquired hammertoes of both feet   5. Diabetic peripheral neuropathy associated with type 2 diabetes mellitus ([place])   6. Encounter for diabetic foot exam ([place])     Plan: No orders of the defined types were placed in this encounter.  -Caregiver/provider present with patient on today's visit. -Examined patient. -For dry skin around heels, recommended O'K[employer]'s [employer] Feet Cream. Apply once daily. -Medicare ABN signed. Patient consents for services of corn(s)/callus(es)  today. Copy has been placed in patient's chart. -Diabetic foot examination performed today. -Stressed the importance of good glycemic  control and the detriment of not  controlling glucose levels in relation to the foot. -Continue diabetic foot care principles: inspect feet daily, monitor glucose as recommended by PCP and/or Endocrinologist, and follow prescribed diet per PCP, Endocrinologist and/or dietician. -Continue supportive shoe gear daily. -Toenails 1-5 b/l were debrided in length and girth with sterile nail nippers and dremel without iatrogenic bleeding.  -Callus(es) bilateral heels pared utilizing rotary bur without complication or incident. Total number pared =2. -Patient/POA to call should there be question/concern in the interim. Return in about 3 months (around 08/02/2022).  Marzetta Board, DPM

## 2022-05-05 ENCOUNTER — Other Ambulatory Visit: Payer: Self-pay | Admitting: Family Medicine

## 2022-05-05 ENCOUNTER — Encounter: Payer: Self-pay | Admitting: Podiatry

## 2022-05-05 DIAGNOSIS — R928 Other abnormal and inconclusive findings on diagnostic imaging of breast: Secondary | ICD-10-CM

## 2022-05-05 DIAGNOSIS — N6310 Unspecified lump in the right breast, unspecified quadrant: Secondary | ICD-10-CM

## 2022-05-12 ENCOUNTER — Ambulatory Visit: Payer: Medicare Other | Admitting: Neurology

## 2022-05-26 ENCOUNTER — Telehealth: Payer: Self-pay

## 2022-05-26 NOTE — Telephone Encounter (Signed)
Patient will stop so we can set up Dexcom on her phone

## 2022-05-27 IMAGING — CR DG CHEST 2V
2 series · 2 of 2 positions shown · non-contrast
Comparison: 01/08/2019.

CLINICAL DATA: Chest pain.

EXAM:
CHEST - 2 VIEW

[w chest pa]
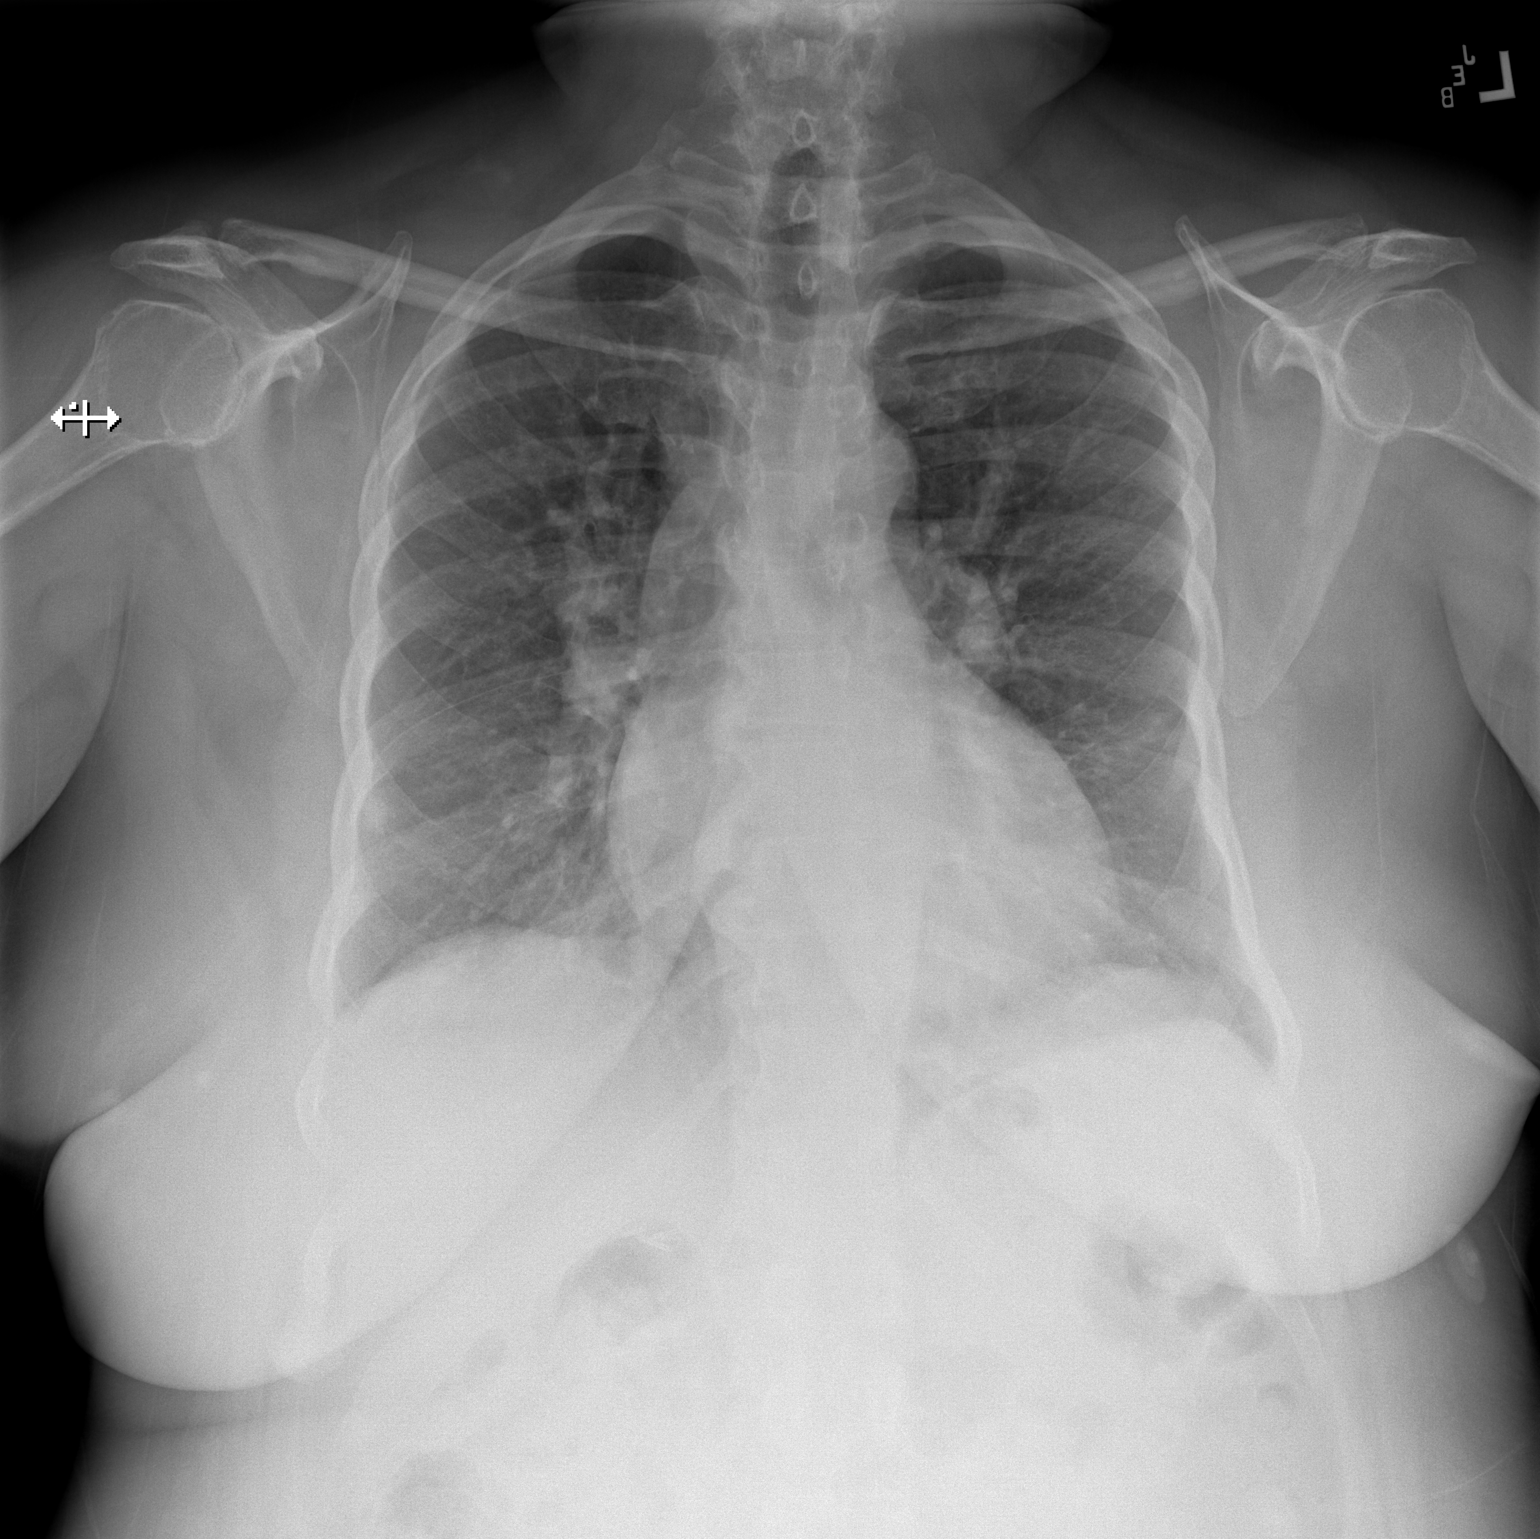

[w chest lat]
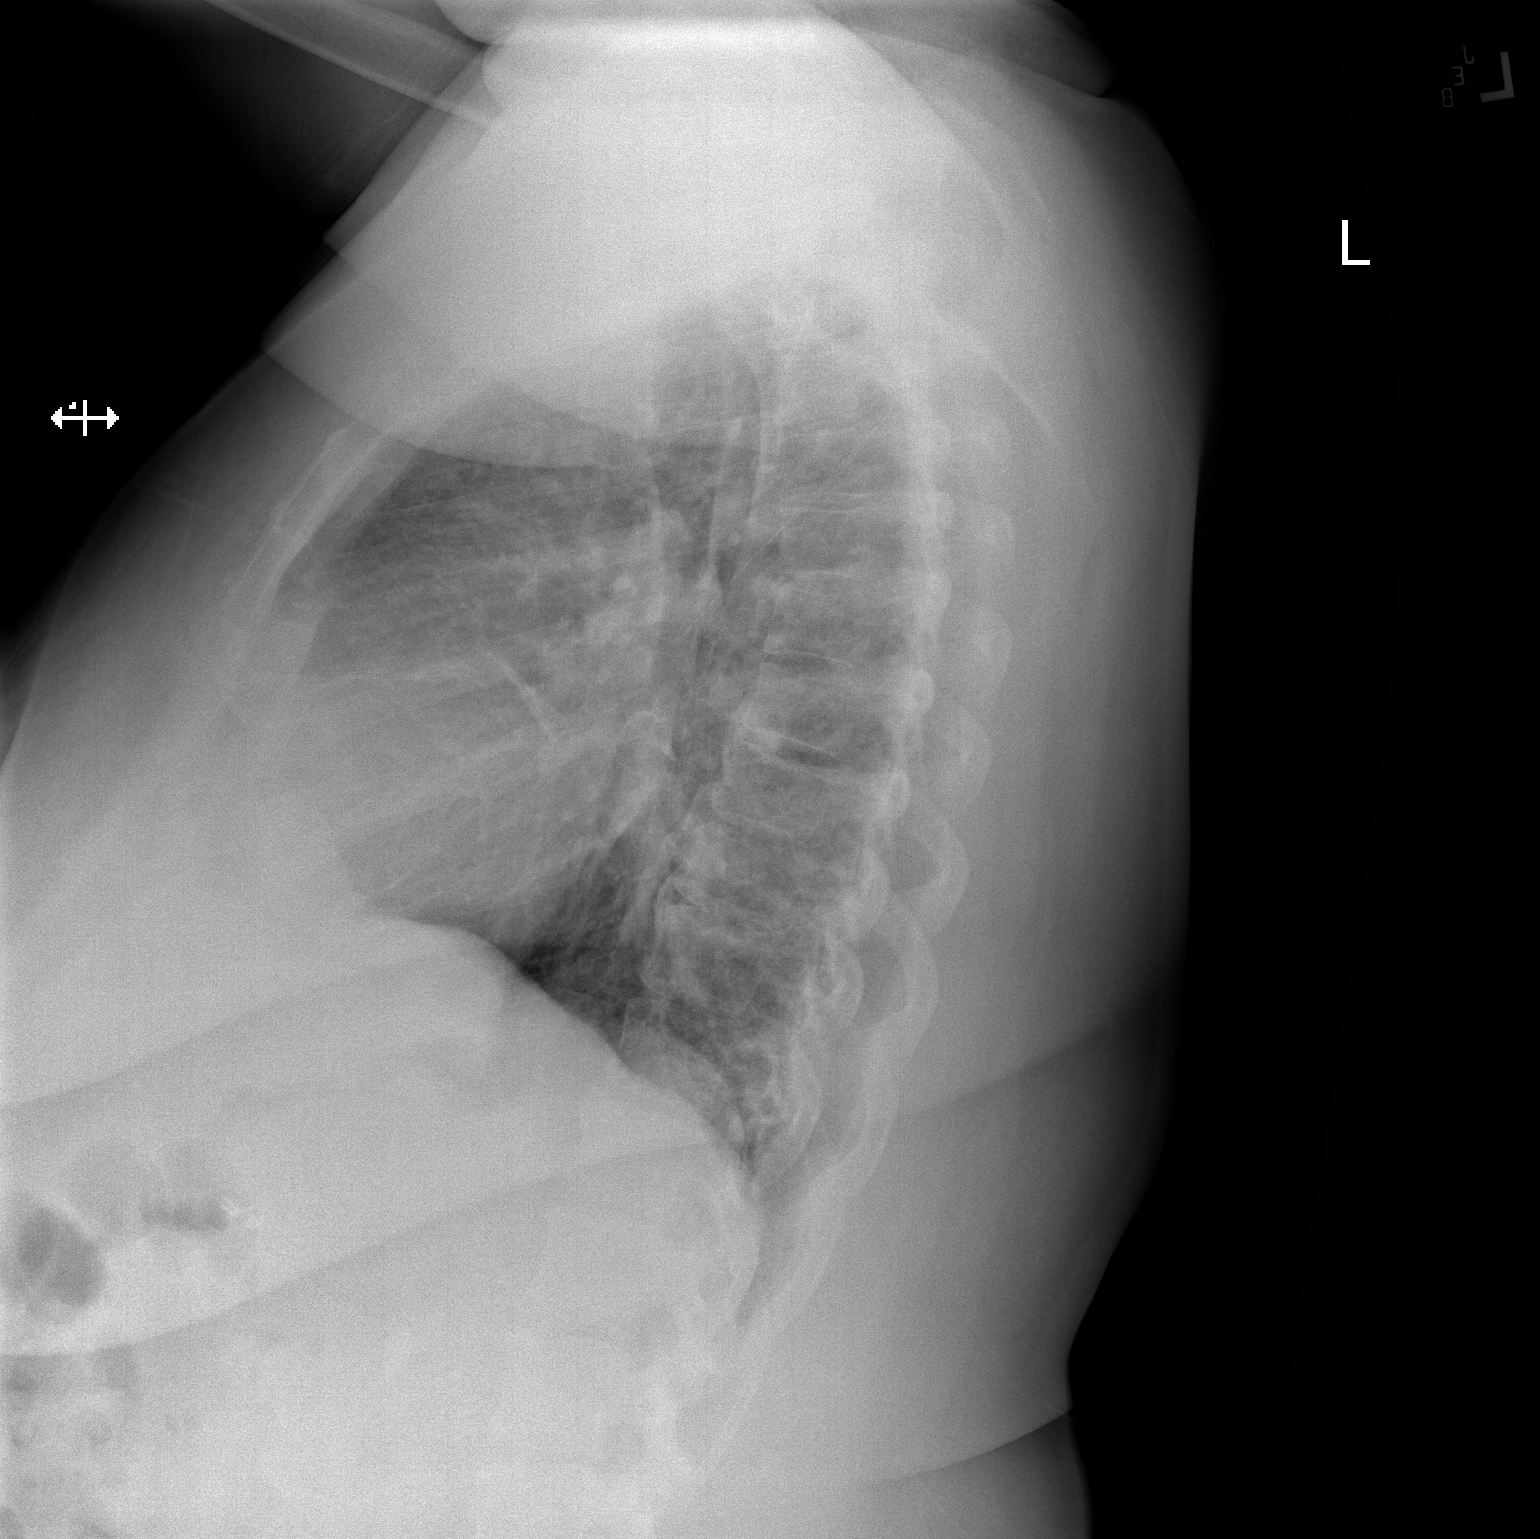

[2 of 2 positions shown; findings below may reference images not displayed]

FINDINGS: Mild enlargement of the cardiac silhouette. No consolidation. New
possible 8 mm pulmonary nodule in the lateral right lower lung. No
visible pleural effusions or pneumothorax. No acute osseous
abnormality. Cholecystectomy clips.
IMPRESSION: 1. New possible 8 mm pulmonary nodule in the lateral right lower
lung. Recommend CT chest to further evaluate.
2. Otherwise, no acute cardiopulmonary disease.

## 2022-06-02 IMAGING — MG DIGITAL SCREENING BILAT W/ TOMO W/ CAD
6 of 12 series · 6 of 36 positions shown · non-contrast
Comparison: Previous exam(s).

CLINICAL DATA: Screening.

EXAM:
DIGITAL SCREENING BILATERAL MAMMOGRAM WITH TOMO AND CAD

[L CC synth-2D (1 of 2)]
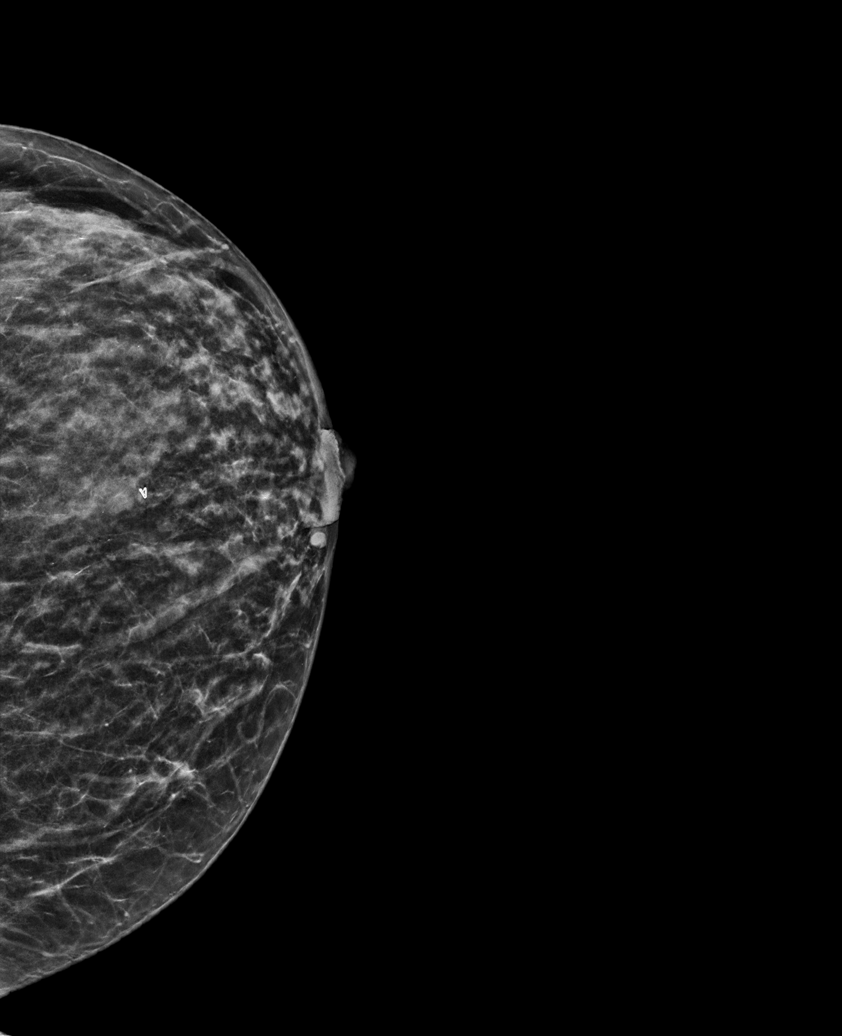

[R MLO synth-2D]
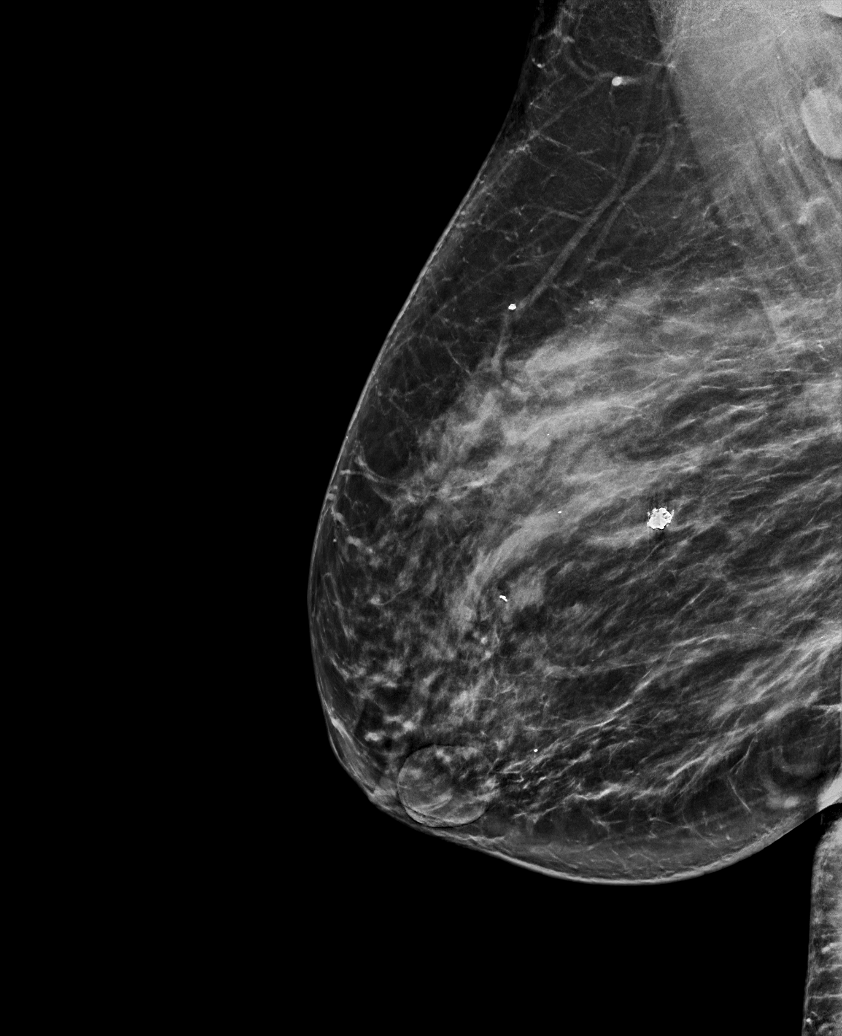

[L MLO synth-2D]
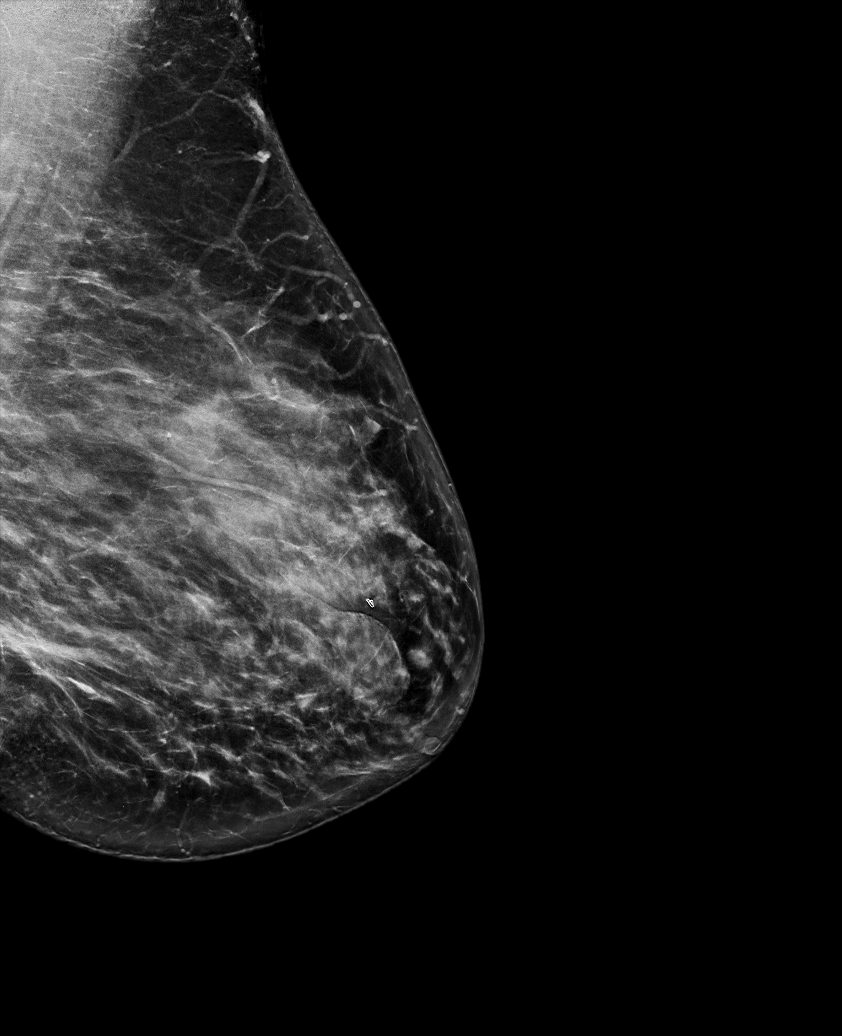

[R CC synth-2D (1 of 2)]
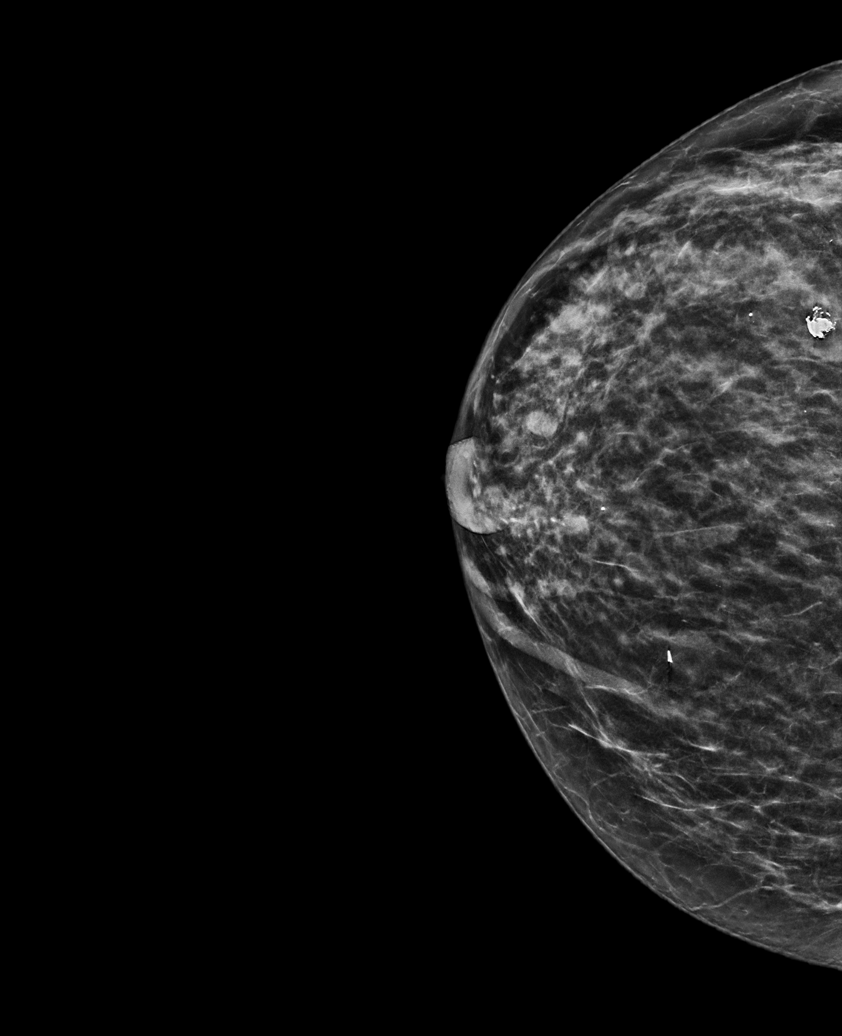

[R CC synth-2D (2 of 2)]
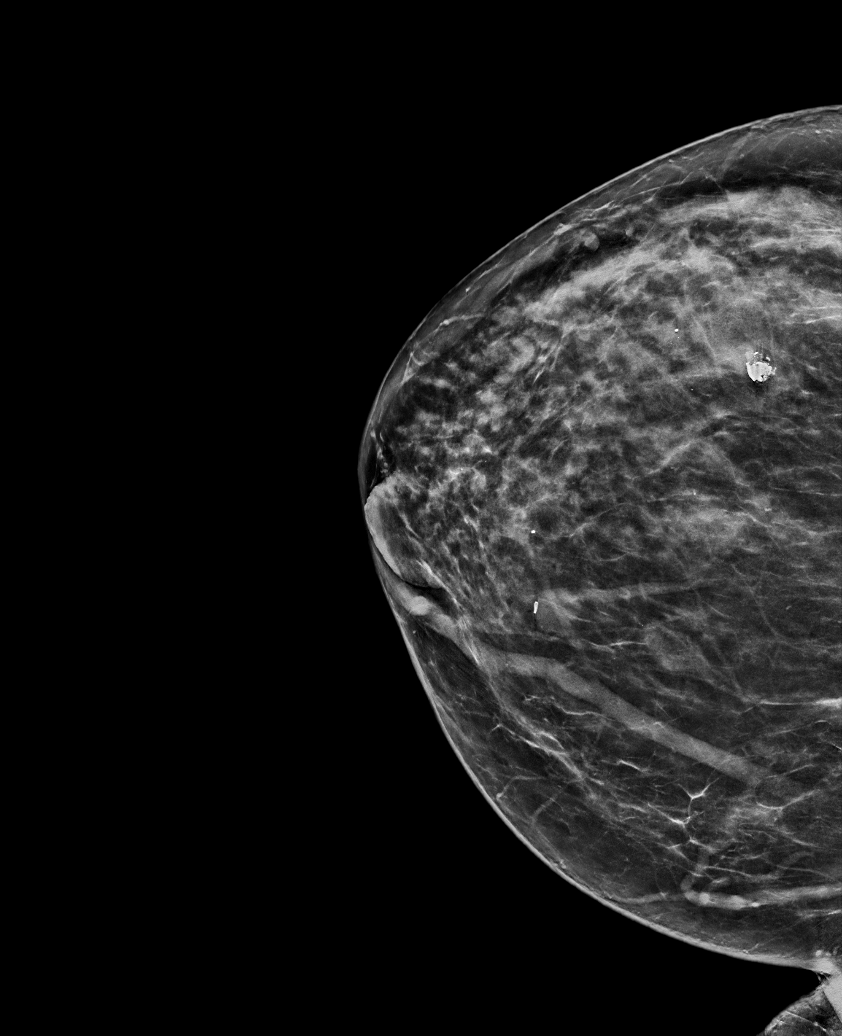

[L CC synth-2D (2 of 2)]
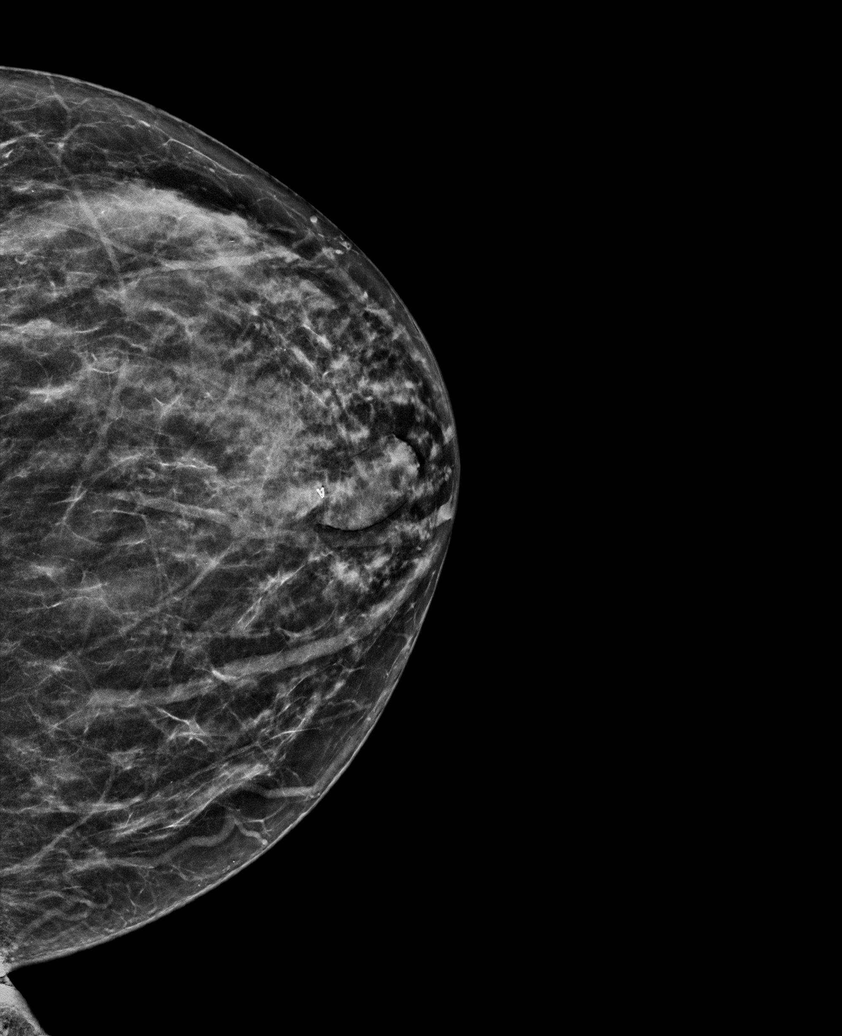

[6 of 36 positions shown; findings below may reference images not displayed]

ACR Breast Density Category c: The breast tissue is heterogeneously
dense, which may obscure small masses.
FINDINGS: There are no findings suspicious for malignancy. Images were
processed with CAD.
IMPRESSION: No mammographic evidence of malignancy. A result letter of this
screening mammogram will be mailed directly to the patient.

RECOMMENDATION:
Screening mammogram in one year. (Code:[id number])

BI-RADS CATEGORY  1: Negative.

## 2022-07-12 ENCOUNTER — Other Ambulatory Visit: Payer: Medicare Other

## 2022-07-13 ENCOUNTER — Other Ambulatory Visit: Payer: Medicare Other

## 2022-07-19 ENCOUNTER — Ambulatory Visit
Admission: RE | Admit: 2022-07-19 | Discharge: 2022-07-19 | Disposition: A | Discharge status: Home / Self Care | Payer: 59 | Source: Ambulatory Visit | Attending: Nurse Practitioner | Admitting: Nurse Practitioner

## 2022-07-19 ENCOUNTER — Other Ambulatory Visit: Payer: Self-pay | Admitting: Nurse Practitioner

## 2022-07-19 DIAGNOSIS — M25561 Pain in right knee: Secondary | ICD-10-CM

## 2022-07-19 DIAGNOSIS — M25562 Pain in left knee: Secondary | ICD-10-CM

## 2022-07-19 DIAGNOSIS — M545 Low back pain, unspecified: Secondary | ICD-10-CM

## 2022-07-19 IMAGING — CR DG CERVICAL SPINE COMPLETE 4+V
6 series · 6 of 6 positions shown · non-contrast
Comparison: CT 01/08/2019

CLINICAL DATA: Left-sided neck pain worsening over the last several
weeks and radiating to the left arm.

EXAM:
CERVICAL SPINE - COMPLETE 4+ VIEW

[w cervical spine lat]
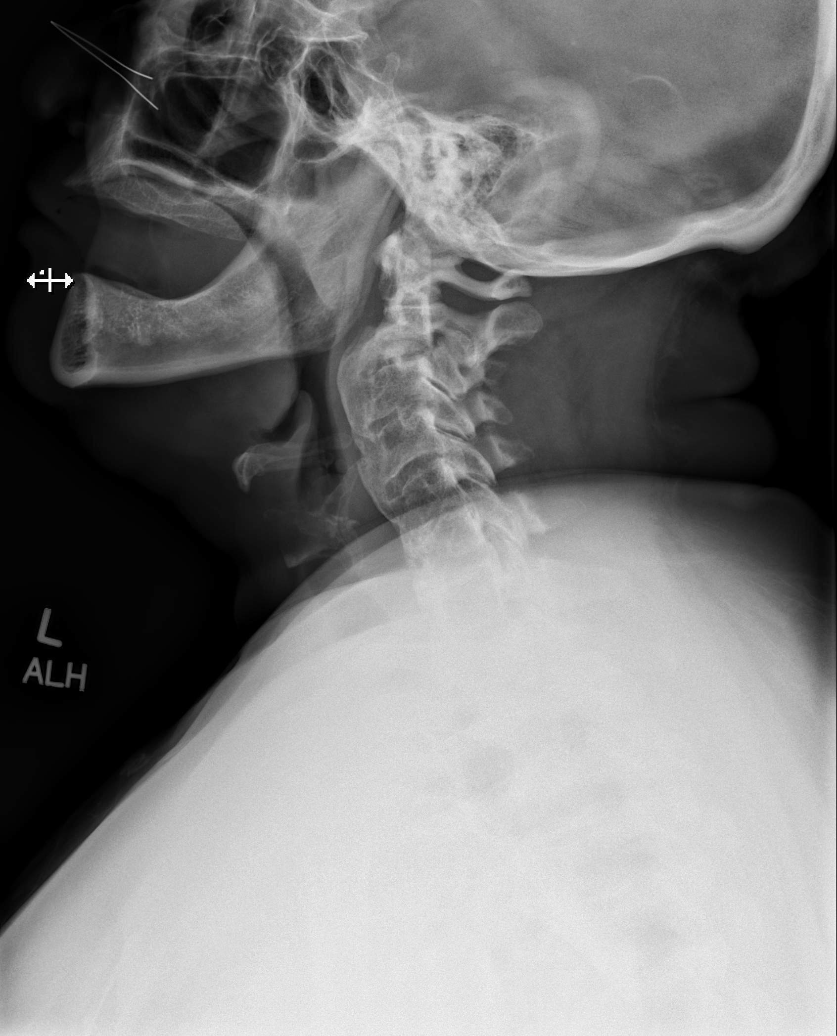

[w cervical spine ap_obl (1 of 2)]
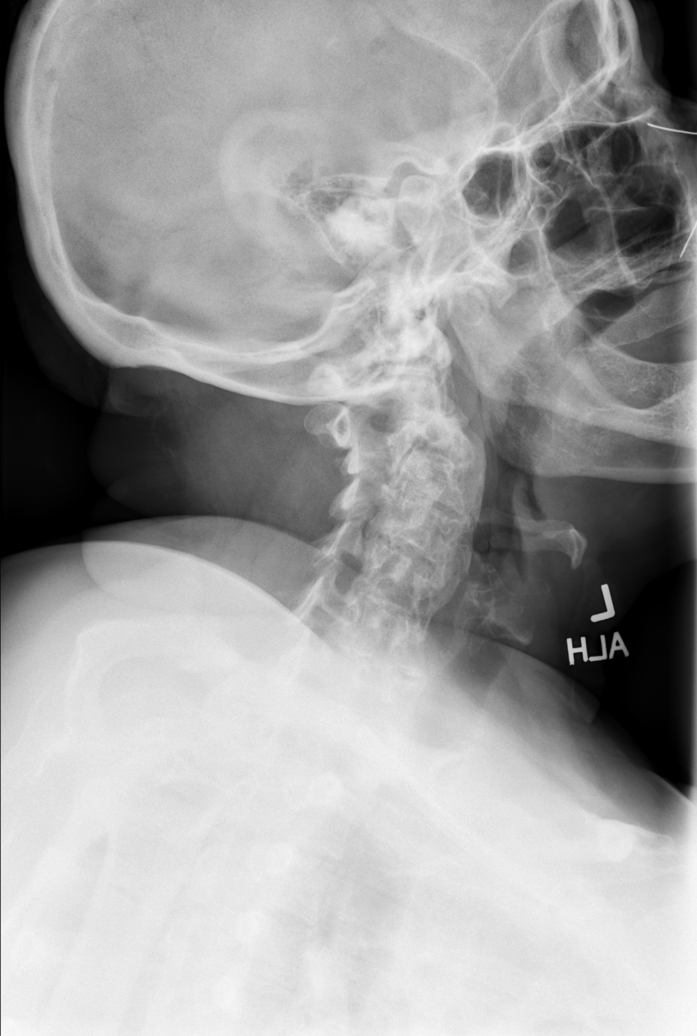

[w cervical spine ap_obl (2 of 2)]
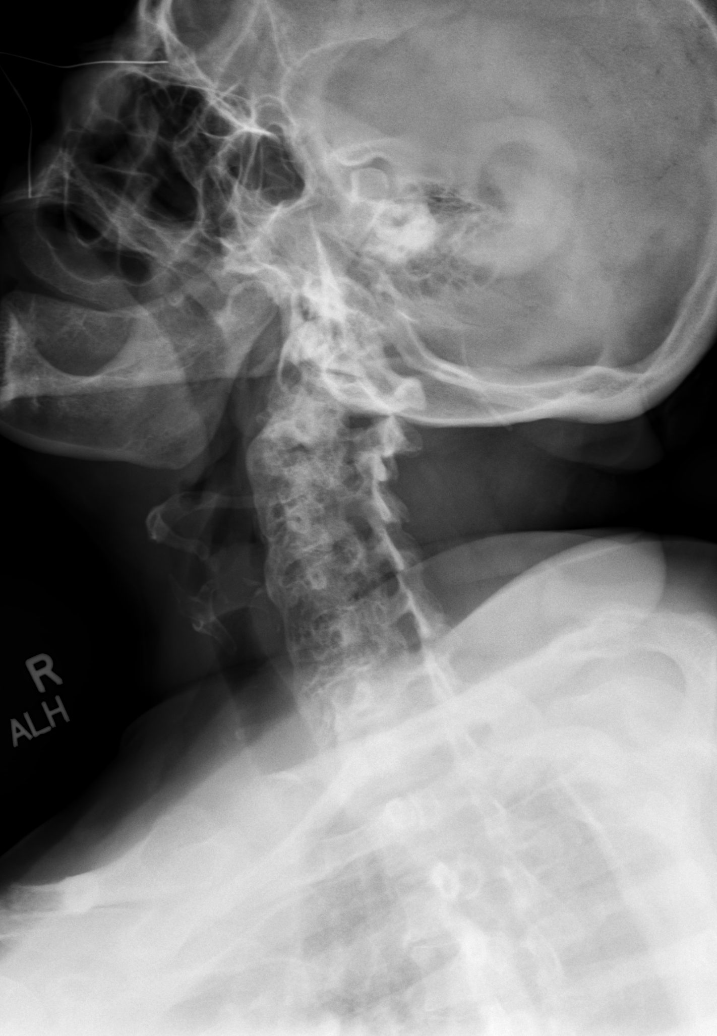

[w cervical spine ap]
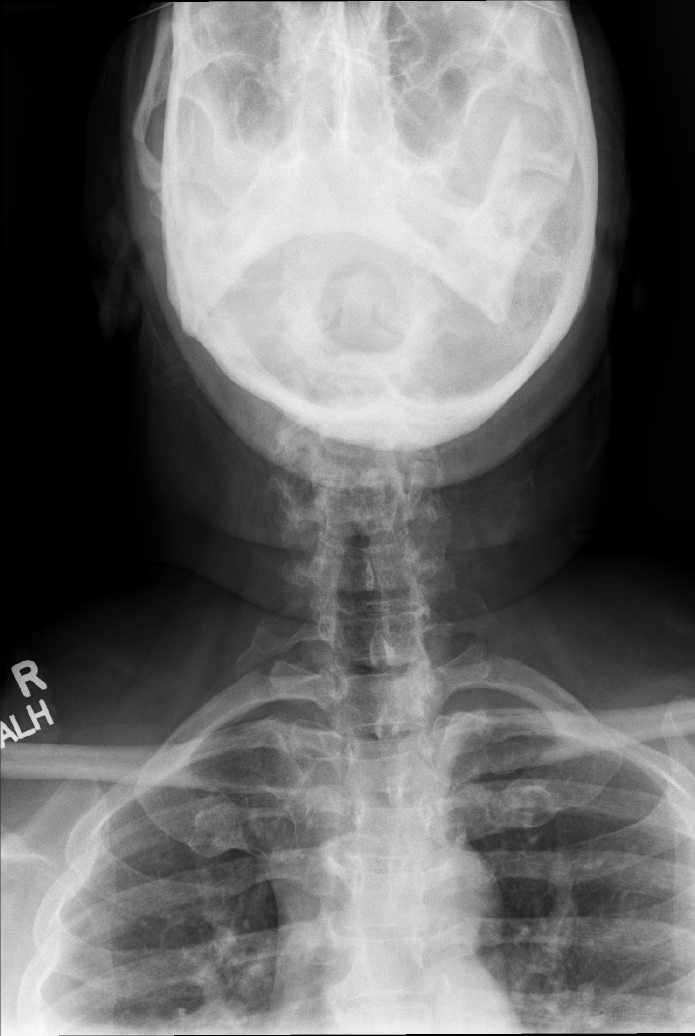

[w cervical spine odontoid]
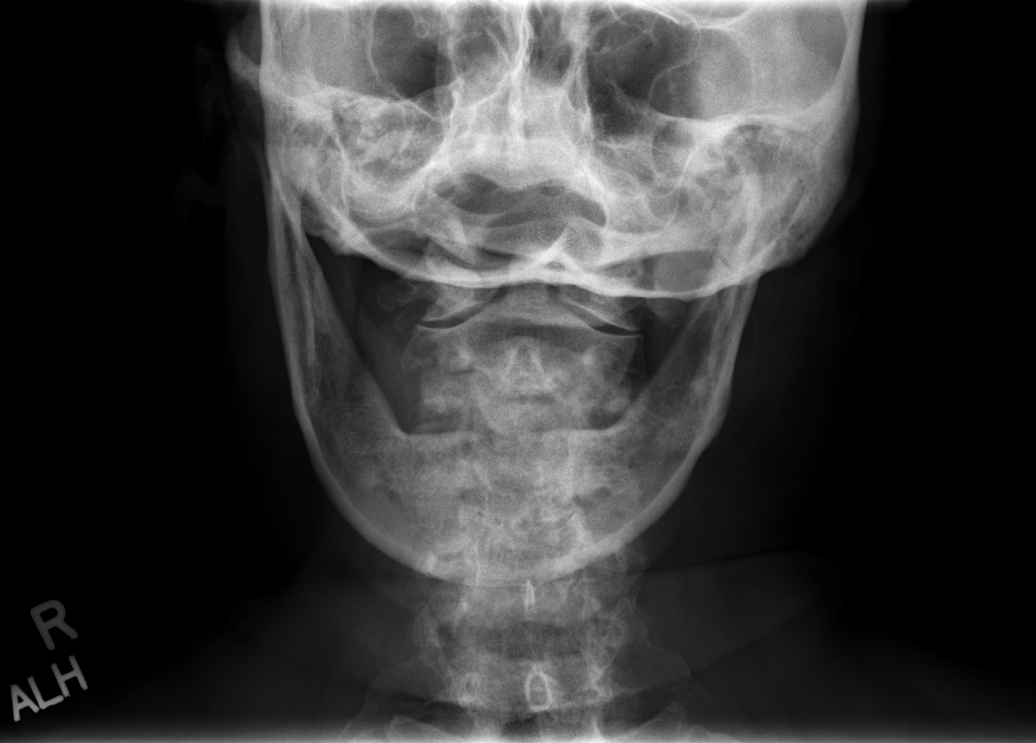

[w cervical swimmers]
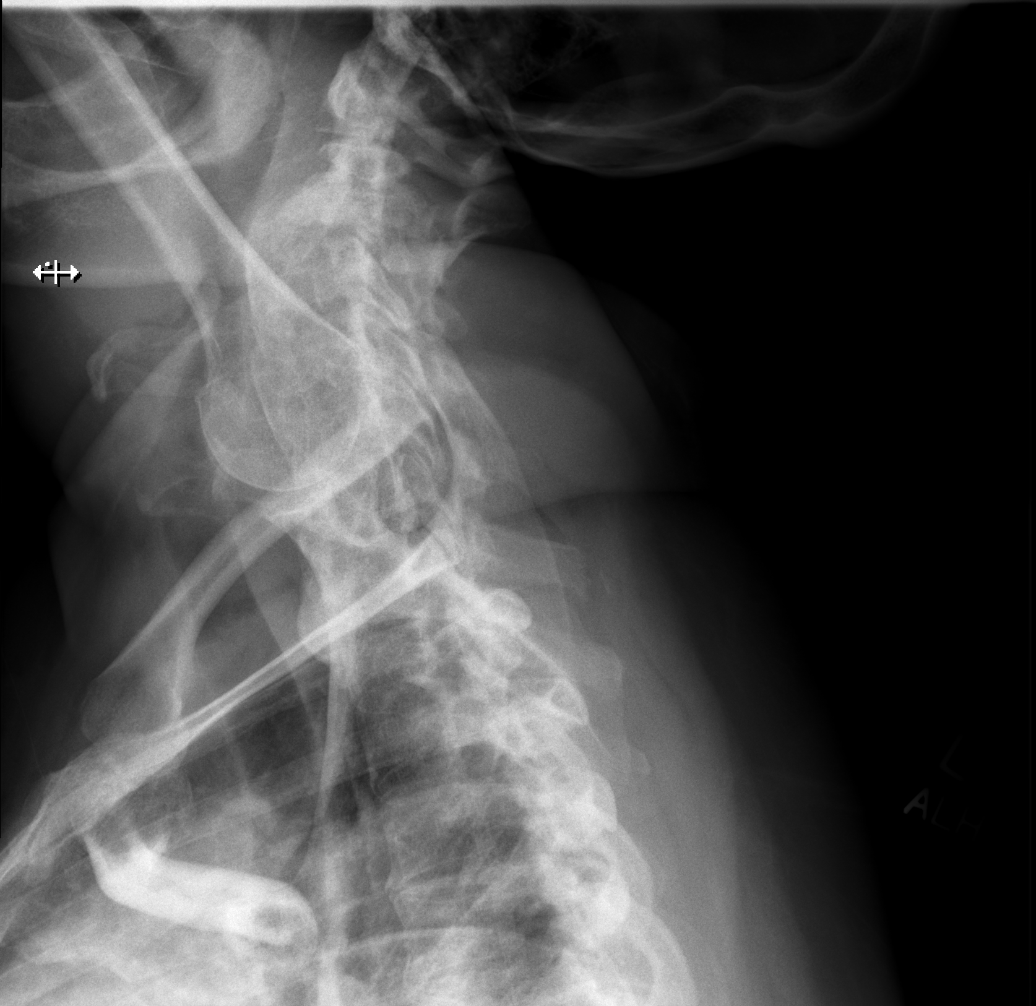

[6 of 6 positions shown; findings below may reference images not displayed]

FINDINGS: Solid bridging anterior osteophytes from C2 to T1. Bony foraminal
stenosis at C2-3, C3-4, C4-5 and C5-6, worse on the right than the
left. Ossification of the posterior longitudinal ligament is better
shown at the prior CT. No traumatic finding.
IMPRESSION: Solid bridging osteophytes from C2 through C7. Upper cervical
foraminal stenosis. Probable canal stenosis based on ossification of
the posterior longitudinal ligament shown by the previous CT.

## 2022-07-19 NOTE — Progress Notes (Unsigned)
Name: ELVERDA PRY  Age/ Sex: 62 y.o., female   MRN/ DOB: [id number], [birth year]     [facility]P: [facility]   Reason for Endocrinology Evaluation: Type 2 Diabetes Mellitus  Initial Endocrine Consultative Visit: 06/29/2020    PATIENT IDENTIFIER: Ms. COETA ARAQUE is a 62 y.o. female with a past medical history of T2DM, Hypothyroidism and Dyslipidemia. The patient has followed with Endocrinology clinic since 06/29/2020 for consultative assistance with management of her diabetes.  DIABETIC HISTORY:  Ms. Marze was diagnosed with T2DM in her 33's. Her hemoglobin A1c has ranged from 9.9% in 2021, peaking at 12.3% in 2014.   She was started on Farxiga by a provider, patient does not recall who started her on it  SUBJECTIVE:      Today (07/20/2022): Ms. Laabs is here for a follow up on diabetes management. She is accompanied by her assistant. She has  not been using Dexcom as her phone has not been working and had to order a new phone.   She presented to the ED in December for hyperglycemia with a serum glucose 237 mg/DL  Pt follows with nephrology Dr. Osborne Casco  She has Wilder Glade on her list, and known prescribing doctor  HOME DIABETES REGIMEN:  Tyler Aas 12 units daily Humalog 6 units TIDQAC  Trulicity 1.5 mg weekly ( Friday )  Farxiga 10 mg daily    Statin: yes ACE-I/ARB: no     CONTINUOUS GLUCOSE MONITORING RECORD INTERPRETATION: n/a   DIABETIC COMPLICATIONS: Microvascular complications:   Denies: CKD III, neuropathy.  Left prosthetic eye. Blind right eye  Last Eye Exam: Completed 04/2020 ( Dr. 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 Her smoking use included cigarettes. She has never used smokeless tobacco. She reports that she does not drink alcohol and does not use drugs. Family History:  Family History  Problem Relation Age of Onset   Diabetes Sister    Glaucoma Sister    Hypertension Sister    Healthy Mother    Healthy Father    Stroke Brother    Heart attack Paternal Aunt    Breast cancer Neg Hx      HOME MEDICATIONS: Allergies as of 07/20/2022       Reactions   Penicillins Hives, Swelling   Has patient had a PCN reaction causing immediate rash, facial/tongue/throat swelling, SOB or lightheadedness with hypotension: yes- face swelling Has patient had a PCN reaction causing severe rash involving mucus membranes or skin necrosis: no Has patient had a PCN reaction that required hospitalization unknown (childhood allergy) Has patient had a PCN reaction occurring within the last 10 years:  no If all of the above answers are "NO", then may proceed with Cephalosporin use.   Tramadol Nausea And Vomiting   Intense nausea   Vicodin [hydrocodone-acetaminophen] Nausea And Vomiting   Codeine Nausea Only   Other reaction(s): Unknown   Iodine-131 Hives   Iohexol Hives   Pt developed itching and hives along with nasal congestion; needs 13 hour premeds for future studies, Onset Date: [id number]   Lisinopril Cough   Other reaction(s): Unknown   Sulfa Antibiotics Nausea And Vomiting        Medication List        Accurate as of July 20, 2022 12:49 PM. If you have any questions, ask your nurse or doctor.          STOP taking these  medications    Trulicity 1.5 0000000 Sopn Generic drug: Dulaglutide Replaced by: Trulicity 3 0000000 Sopn Stopped by: Dorita Sciara, MD       TAKE these medications    albuterol 108 (90 Base) MCG/ACT inhaler Commonly known as: VENTOLIN HFA Inhale 2 puffs into the lungs every 4 (four) hours as needed for wheezing or shortness of breath. For short   allopurinol 300 MG tablet Commonly known as: ZYLOPRIM Take 300 mg by mouth 2 (two) times daily.   aspirin EC 81 MG tablet Take 81 mg by mouth daily.   blood glucose meter kit and supplies Dispense based on patient and insurance preference. Use up to four times daily as directed. (FOR ICD-10 E10.9, E11.9).   cetirizine 10 MG tablet Commonly known as: ZYRTEC Take 10 mg by mouth daily.   clotrimazole-betamethasone cream Commonly known as: Lotrisone Apply 1 application topically 2 (two) times daily.   colchicine 0.6 MG tablet Take by mouth.   Dexcom G7 Sensor Misc 1 Device by Does not apply route as directed.   Embrace Talk Glucose Test test strip Generic drug: glucose blood USE TO CHECK BLOOD SUGARS FOUR TIMES A DAY BEFORE MEALS AND AT BEDTIME DX E11.29   esomeprazole 40 MG capsule Commonly known as: NEXIUM Take 1 capsule (40 mg total) by mouth daily.   Farxiga 10 MG Tabs tablet Generic drug: dapagliflozin propanediol Take 10 mg by mouth daily.   gabapentin 400 MG capsule Commonly known as: NEURONTIN SMARTSIG:1 Capsule(s) By Mouth 1-4 Times Daily PRN What changed: Another medication with the same name was removed. Continue taking this medication, and follow the directions you see here. Changed by: Dorita Sciara, MD   hydrOXYzine 25 MG tablet Commonly known as: ATARAX Take 0.5 tablets (12.5 mg total) by mouth every 8 (eight) hours as needed for itching.   insulin lispro 100 UNIT/ML KwikPen Commonly known as: HUMALOG MAX DAILY 30 UNITS   levothyroxine 150 MCG tablet Commonly known as:  SYNTHROID Take 1 tablet (150 mcg total) by mouth daily. Needs appt for future refills   linaclotide 145 MCG Caps capsule Commonly known as: LINZESS Take 145 mcg by mouth daily as needed (constipation).   naproxen 500 MG tablet Commonly known as: NAPROSYN Take 500 mg by mouth 2 (two) times daily with a meal.   nitrofurantoin (macrocrystal-monohydrate) 100 MG capsule Commonly known as: MACROBID Take 1 capsule (100 mg total) by mouth 2 (two) times daily.   olmesartan 20 MG tablet Commonly known as: BENICAR Take 10 mg by mouth daily.   ondansetron 4 MG disintegrating tablet Commonly known as: Zofran ODT Take 1 tablet (4 mg total) by mouth every 8 (eight) hours as needed for nausea or  vomiting.   Pen Needles 31G X 8 MM Misc Inject 1 Device into the skin in the morning, at noon, in the evening, and at bedtime.   potassium chloride SA 20 MEQ tablet Commonly known as: KLOR-CON M Take 1 tablet (20 mEq total) by mouth daily. Need appt for future refills   Prodigy Twist Top Lancets 28G Misc USE TO CHECK BLOOD SUGARS   simvastatin 20 MG tablet Commonly known as: ZOCOR Take 1 tablet (20 mg total) by mouth every evening.   spironolactone 25 MG tablet Commonly known as: ALDACTONE Take 0.5 tablets (12.5 mg total) by mouth daily. Needs appt for future refills   tiZANidine 2 MG tablet Commonly known as: ZANAFLEX Take 2 mg by mouth 2 (two) times daily.   torsemide 20 MG tablet Commonly known as: DEMADEX Take 4 tablets (80 mg total) by mouth daily. Need appt for future refills   Tresiba FlexTouch 200 UNIT/ML FlexTouch Pen Generic drug: insulin degludec Inject 12 Units into the skin daily at 6 (six) AM.   Trulicity 3 0000000 Sopn Generic drug: Dulaglutide Inject 3 mg as directed once a week. Replaces: Trulicity 1.5 0000000 Sopn Started by: Dorita Sciara, MD         OBJECTIVE:   Vital Signs: BP 116/74 (BP Location: Left Arm, Patient Position: Sitting, Cuff Size:  Small)   Pulse 75   Ht '4\' 11"'$  (1.499 m)   Wt 174 lb (78.9 kg)   SpO2 97%   BMI 35.14 kg/m   Wt Readings from Last 3 Encounters:  07/20/22 174 lb (78.9 kg)  04/20/22 172 lb 3.2 oz (78.1 kg)  04/16/22 170 lb (77.1 kg)     Exam: General: Pt appears well and is in NAD  Lungs: Clear with good BS bilat   Heart: RRR   Extremities: No pretibial edema.   Neuro: MS is good with appropriate affect, pt is alert and Ox3       DM Foot Exam 05/03/2022 per podiatry    The skin of the feet shows pre-ulcerative callus at the right heel  The pedal pulses are 2+ on right and 2+ on left. The sensation is intact to a screening 5.07, 10 gram monofilament bilaterally     DATA REVIEWED:  Lab Results  Component Value Date   HGBA1C 8.7 (A) 07/20/2022   HGBA1C 7.4 (A) 01/19/2022   HGBA1C 6.6 (A) 07/21/2021    Latest Reference Range & Units 04/16/22 20:38  Sodium 135 - 145 mmol/L 141  Potassium 3.5 - 5.1 mmol/L 3.1 (L)  Chloride 98 - 111 mmol/L 112 (H)  CO2 22 - 32 mmol/L 19 (L)  Glucose 70 - 99 mg/dL 277 (H)  BUN 8 - 23 mg/dL 25 (H)  Creatinine 0.44 - 1.00 mg/dL 1.68 (H)  Calcium 8.9 - 10.3 mg/dL 8.1 (L)  Anion gap 5 - 15  10  GFR, Estimated >60 mL/min 34 (L)    ASSESSMENT / PLAN / RECOMMENDATIONS:   1) Type 2 Diabetes Mellitus, poorly controlled, With neuropathic, retinopathic and CKD III complications - Most recent A1c of 8.7 %. Goal A1c < 7.0 %.     -Unfortunately, the patient has been noted with hyperglycemia, I suspect that this is due to the fact that she has not been using the CGM, but I am also concerned that she uses her medications haphazardly as she is noted with glycemic excursions in the past -She is on Farxiga, she does not know who prescriber for her, I have  asked her to continue -I will increase her Trulicity as below - No changes to her basal/prandial dose of insulin  - Main barrier to diabetes self care is visual impairment -We have not been able to help her  with a Dexcom app because her phone is new and has no phone service at this time -She has not done well with a correction scale, we will avoid this at this time  MEDICATIONS: -Increase Trulicity 3 mg weekly -Continue Tresiba 12 units daily  -Continue  Humalog 6 units before every meal 3 times daily -Continue Farxiga 10 mg daily  EDUCATION / INSTRUCTIONS: BG monitoring instructions: Patient is instructed to check her blood sugars 3 times a day, before meals. Call [facility] if: BG persistently < 70  I reviewed the Rule of 15 for the treatment of hypoglycemia in detail with the patient. Literature supplied.    2) Diabetic complications:  Eye: Does  have known diabetic retinopathy.  Neuro/ Feet: Does  have known diabetic peripheral neuropathy .  Renal: Patient does  have known baseline CKD. She   is not on an ACEI/ARB at present.      F/U in 6 months    Signed electronically by: Mack Guise, MD  [facility]  [facility] [address] [place], [place] [address] Phone: [phone] FAX: [phone]   [facility] [address] [place] [address] Phone: [phone]  Fax: [phone]  Return to Endocrinology clinic as below: Future Appointments  Date Time Provider [place]  08/08/2022  9:00 AM Troy Sine, MD [facility] None  08/12/2022 10:00 AM [facility] DIAG [place] 1 [facility]  08/12/2022 10:10 AM [facility] [place] 1 [facility]  08/23/2022 10:45 AM Marzetta Board, DPM [facility]  04/19/2023 10:00 AM Lomax, Amy, NP [facility] None

## 2022-07-20 ENCOUNTER — Ambulatory Visit (INDEPENDENT_AMBULATORY_CARE_PROVIDER_SITE_OTHER): Payer: 59 | Admitting: Internal Medicine

## 2022-07-20 ENCOUNTER — Encounter: Payer: Self-pay | Admitting: Internal Medicine

## 2022-07-20 VITALS — BP 116/74 | HR 75 | Ht 59.0 in | Wt 174.0 lb

## 2022-07-20 DIAGNOSIS — E1165 Type 2 diabetes mellitus with hyperglycemia: Secondary | ICD-10-CM | POA: Diagnosis not present

## 2022-07-20 DIAGNOSIS — E1142 Type 2 diabetes mellitus with diabetic polyneuropathy: Secondary | ICD-10-CM

## 2022-07-20 DIAGNOSIS — Z794 Long term (current) use of insulin: Secondary | ICD-10-CM

## 2022-07-20 DIAGNOSIS — E11319 Type 2 diabetes mellitus with unspecified diabetic retinopathy without macular edema: Secondary | ICD-10-CM

## 2022-07-20 LAB — POCT GLYCOSYLATED HEMOGLOBIN (HGB A1C): Hemoglobin A1C: 8.7 % — AB (ref 4.0–5.6)

## 2022-07-20 LAB — POCT GLUCOSE (DEVICE FOR HOME USE): POC Glucose: 156 mg/dl — AB (ref 70–99)

## 2022-07-20 MED ORDER — TRULICITY 3 MG/0.5ML ~~LOC~~ SOAJ: 3.0000 mg | SUBCUTANEOUS | 3 refills | Status: DC

## 2022-07-20 NOTE — Patient Instructions (Signed)
-   Continue Tresiba  12 units daily - Increase Trulicity 3  mg weekly  - Continue  Humalog  6 units with Breakfast, 6 units with Lunch and 6 units with Supper  - Continue Farxiga 10 mg , 1 tablet daily      HOW TO TREAT LOW BLOOD SUGARS (Blood sugar LESS THAN 70 MG/DL) Please follow the RULE OF 15 for the treatment of hypoglycemia treatment (when your (blood sugars are less than 70 mg/dL)   STEP 1: Take 15 grams of carbohydrates when your blood sugar is low, which includes:  3-4 GLUCOSE TABS  OR 3-4 OZ OF JUICE OR REGULAR SODA OR ONE TUBE OF GLUCOSE GEL    STEP 2: RECHECK blood sugar in 15 MINUTES STEP 3: If your blood sugar is still low at the 15 minute recheck --> then, go back to STEP 1 and treat AGAIN with another 15 grams of carbohydrates

## 2022-08-08 ENCOUNTER — Ambulatory Visit: Payer: 59 | Admitting: Cardiovascular Disease

## 2022-08-12 ENCOUNTER — Ambulatory Visit
Admission: RE | Admit: 2022-08-12 | Discharge: 2022-08-12 | Disposition: A | Discharge status: Home / Self Care | Payer: Medicare Other | Source: Ambulatory Visit | Attending: Family Medicine | Admitting: Family Medicine

## 2022-08-12 ENCOUNTER — Other Ambulatory Visit: Payer: Self-pay | Admitting: Family Medicine

## 2022-08-12 ENCOUNTER — Ambulatory Visit
Admission: RE | Admit: 2022-08-12 | Discharge: 2022-08-12 | Disposition: A | Discharge status: Home / Self Care | Payer: 59 | Source: Ambulatory Visit | Attending: Family Medicine | Admitting: Family Medicine

## 2022-08-12 DIAGNOSIS — N6310 Unspecified lump in the right breast, unspecified quadrant: Secondary | ICD-10-CM

## 2022-08-19 ENCOUNTER — Ambulatory Visit
Admission: RE | Admit: 2022-08-19 | Discharge: 2022-08-19 | Disposition: A | Discharge status: Home / Self Care | Payer: 59 | Source: Ambulatory Visit | Attending: Family Medicine | Admitting: Family Medicine

## 2022-08-19 DIAGNOSIS — N6310 Unspecified lump in the right breast, unspecified quadrant: Secondary | ICD-10-CM

## 2022-08-19 HISTORY — PX: BREAST BIOPSY: SHX20

## 2022-08-23 ENCOUNTER — Encounter: Payer: Self-pay | Admitting: Podiatry

## 2022-08-23 ENCOUNTER — Ambulatory Visit (INDEPENDENT_AMBULATORY_CARE_PROVIDER_SITE_OTHER): Payer: 59 | Admitting: Podiatry

## 2022-08-23 DIAGNOSIS — B351 Tinea unguium: Secondary | ICD-10-CM

## 2022-08-23 DIAGNOSIS — E1142 Type 2 diabetes mellitus with diabetic polyneuropathy: Secondary | ICD-10-CM

## 2022-08-23 DIAGNOSIS — M79676 Pain in unspecified toe(s): Secondary | ICD-10-CM

## 2022-08-23 DIAGNOSIS — L84 Corns and callosities: Secondary | ICD-10-CM | POA: Diagnosis not present

## 2022-08-23 NOTE — Progress Notes (Signed)
Subjective:  Patient ID: Amy [name], female    DOB: Apr 19, 1961,  MRN: [id number]  Amy [name] presents to clinic today for at risk foot care with history of diabetic neuropathy and callus(es) right heel and painful thick toenails that are difficult to trim. Painful toenails interfere with ambulation. Aggravating factors include wearing enclosed shoe gear. Pain is relieved with periodic professional debridement. Painful calluses are aggravated when weightbearing with and without shoegear. Pain is relieved with periodic professional debridement.  Chief Complaint  Patient presents with   Nail Problem    DFC BS-124 A1C-10.0 [facility]- 2weeks ag    New problem(s): None.   She states her heel protectors are too big.  [facility] is [facility].  Allergies  Allergen Reactions   Penicillins Hives and Swelling    Has patient had a PCN reaction causing immediate rash, facial/tongue/throat swelling, SOB or lightheadedness with hypotension: yes- face swelling Has patient had a PCN reaction causing severe rash involving mucus membranes or skin necrosis: no Has patient had a PCN reaction that required hospitalization unknown (childhood allergy) Has patient had a PCN reaction occurring within the last 10 years: no If all of the above answers are "NO", then may proceed with Cephalosporin use.    Tramadol Nausea And Vomiting    Intense nausea   Vicodin [Hydrocodone-Acetaminophen] Nausea And Vomiting   Codeine Nausea Only    Other reaction(s): Unknown   Iodine-131 Hives   Iohexol Hives    Pt developed itching and hives along with nasal congestion; needs 13 hour premeds for future studies, Onset Date: [id number]    Lisinopril Cough    Other reaction(s): Unknown   Sulfa Antibiotics Nausea And Vomiting    Review of Systems: Negative except as noted in the HPI.  Objective:  There were no vitals filed for this visit. Amy [name] is a pleasant 62 y.o.  female morbidly obese in NAD. AAO x 3.  Vascular Examination: CFT <3 seconds b/l. DP/PT pulses faintly palpable b/l. Skin temperature gradient warm to warm b/l. No pain with calf compression. No ischemia or gangrene. No cyanosis or clubbing noted b/l. Trace edema noted BLE.   Neurological Examination: Sensation grossly intact b/l with 10 gram monofilament. Vibratory sensation intact b/l. Pt has subjective symptoms of neuropathy.  Dermatological Examination: Pedal skin warm and supple b/l. Toenails 1-5 b/l thick, discolored, elongated with subungual debris and pain on dorsal palpation.  No open wounds b/l LE. No interdigital macerations noted b/l LE.   Hyperkeratotic lesion(s) right heel.  No erythema, no edema, no drainage, no fluctuance.  Musculoskeletal Examination: Muscle strength 5/5 to b/l LE. HAV with bunion deformity noted b/l LE. Hammertoe deformity noted 2-5 b/l. Utilizes rollator for ambulation assistance.  Radiographs: None  Assessment/Plan: 1. Pain due to onychomycosis of toenail   2. Callus   3. Diabetic peripheral neuropathy associated with type 2 diabetes mellitus     -Patient was evaluated and treated. All patient's and/or POA's questions/concerns answered on today's visit. -Pt relates heel protectors are too big. I advised her to look for pediatric size heel protectors. -Patient to continue soft, supportive shoe gear daily. -Mycotic toenails 1-5 bilaterally were debrided in length and girth with sterile nail nippers and dremel without incident. -Callus(es) right heel pared utilizing rotary bur without complication or incident. Total number pared =1. -Patient/POA to call should there be question/concern in the interim.   Return in about 3 months (around 11/22/2022).  Amy [name], DPM

## 2022-09-13 ENCOUNTER — Other Ambulatory Visit: Payer: 59

## 2022-09-20 ENCOUNTER — Encounter: Payer: Self-pay | Admitting: Cardiovascular Disease

## 2022-09-20 ENCOUNTER — Ambulatory Visit: Payer: 59 | Attending: Cardiovascular Disease | Admitting: Cardiovascular Disease

## 2022-09-20 VITALS — BP 108/60 | HR 70 | Ht 59.0 in | Wt 171.4 lb

## 2022-09-20 DIAGNOSIS — I5032 Chronic diastolic (congestive) heart failure: Secondary | ICD-10-CM | POA: Diagnosis not present

## 2022-09-20 DIAGNOSIS — E118 Type 2 diabetes mellitus with unspecified complications: Secondary | ICD-10-CM

## 2022-09-20 DIAGNOSIS — H5440 Blindness, one eye, unspecified eye: Secondary | ICD-10-CM | POA: Diagnosis not present

## 2022-09-20 DIAGNOSIS — I10 Essential (primary) hypertension: Secondary | ICD-10-CM | POA: Diagnosis not present

## 2022-09-20 DIAGNOSIS — N1832 Chronic kidney disease, stage 3b: Secondary | ICD-10-CM

## 2022-09-20 DIAGNOSIS — Z794 Long term (current) use of insulin: Secondary | ICD-10-CM

## 2022-09-20 DIAGNOSIS — G4733 Obstructive sleep apnea (adult) (pediatric): Secondary | ICD-10-CM

## 2022-09-20 DIAGNOSIS — E785 Hyperlipidemia, unspecified: Secondary | ICD-10-CM

## 2022-09-20 NOTE — Patient Instructions (Signed)
  Testing/Procedures:  Your physician has requested that you have an echocardiogram. Echocardiography is a painless test that uses sound waves to create images of your heart. It provides your doctor with information about the size and shape of your heart and how well your heart's chambers and valves are working. This procedure takes approximately one hour. There are no restrictions for this procedure. Please do NOT wear cologne, perfume, aftershave, or lotions (deodorant is allowed). Please arrive 15 minutes prior to your appointment time. [address]   Follow-Up: At [facility], you and your health needs are our priority.  As part of our continuing mission to provide you with exceptional heart care, we have created designated Provider Care Teams.  These Care Teams include your primary Cardiologist (physician) and Advanced Practice Providers (APPs -  Physician Assistants and Nurse Practitioners) who all work together to provide you with the care you need, when you need it.  We recommend signing up for the patient portal called "MyChart".  Sign up information is provided on this After Visit Summary.  MyChart is used to connect with patients for Virtual Visits (Telemedicine).  Patients are able to view lab/test results, encounter notes, upcoming appointments, etc.  Non-urgent messages can be sent to your provider as well.   To learn more about what you can do with MyChart, go to [email or web address].    Your next appointment:    AS NEEDED   

## 2022-09-20 NOTE — Progress Notes (Signed)
Cardiology Office Note    Date:  09/26/2022   ID:  Amy, [name] [birth year], MRN [id number]  PCP:  [facility]  Cardiologist:  Nicki Guadalajara, MD   New cardiology consultation and evaluation referred by [facility] for heart failure with preserved ejection fraction.  History of Present Illness:  Amy [name] is a 62 y.o. 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  She has a history of documented obstructive sleep apnea and sees Dr. Queen Blossom at [facility] for her sleep apnea.  She has been on CPAP therapy and prior sleep study had shown severe sleep apnea with an AHI of 79.1/h and O2 nadir at 50%.  She had received a new CPAP machine with set up date in March 04, 2022 which was a ResMed AirSense 11 AutoSet unit.  She last saw Dr. Frances Furbish for sleep apnea on April 20, 2022.  She had undergone an initial echo Doppler study on July 05, 2015 ordered by Dr. Sanda Klein for congestive heart failure.  EF was 60 to 65%.  There were no wall motion abnormalities.  She was found to have grade 2 diastolic dysfunction and Doppler parameters were consistent with high ventricular filling pressure.  A subsequent echo Doppler study in November 2019  ordered by Duwaine Maxin, NP for chronic diastolic heart failure showed EF at 60 to 65% with normal wall motion and grade 1 diastolic dysfunction.  Valves were essentially normal.  She underwent recent evaluation at [facility] on June 16, 2022.  At that time, there was concerns for possible low blood pressure leading to fatigability and dizziness and torsemide was discontinued.  It was recommended she undergo cardiology consultation she presents to the office today  for evaluation.  Presently, she is on olmesartan 20 mg for hypertension.  She takes aspirin 81 mg.  She is diabetic on Farxiga 10 mg, Trulicity 3 mg weekly injection, Tresiba insulin.  She is on levothyroxine 150 mcg for hypothyroidism.  She is on some simvastatin 20 mg daily for hyperlipidemia.  She is no longer on spironolactone.  She denies any chest pain.  Previous shortness of breath has improved with weight loss.  She presents for evaluation.   Past Medical History:  Diagnosis Date   Arthritis    Asthma    Blind    Blindness and low vision    left eye glass eye,  legally blind in right eye   Diabetes mellitus    Diabetic neuropathy (HCC)    Hyperlipidemia    Hypertension    Hypothyroidism    Intractable nausea and vomiting 03/06/2018   Spinal stenosis     Past Surgical History:  Procedure Laterality Date   ABDOMINAL HYSTERECTOMY  1990   BREAST BIOPSY Left    BREAST BIOPSY Right    BREAST BIOPSY Right 08/19/2022   [place] RT BREAST BX W LOC DEV 1ST LESION IMG BX SPEC US GUIDE 08/19/2022 [facility] MAMMOGRAPHY   CESAREAN SECTION  1987, 1990   x 2   CHOLECYSTECTOMY  2008   ENUCLEATION Bilateral 09/15/1998   EYE SURGERY  2016   fitting artificial eye    Current Medications: Outpatient Medications Prior to Visit  Medication Sig Dispense Refill   albuterol (PROVENTIL HFA;VENTOLIN HFA)  108 (90 BASE) MCG/ACT inhaler Inhale 2 puffs into the lungs every 4 (four) hours as needed for wheezing or shortness of breath. For short     allopurinol (ZYLOPRIM) 300 MG tablet Take 300 mg by mouth 2 (two) times daily.     aspirin EC 81 MG tablet Take 81 mg by mouth daily.     blood glucose meter kit and supplies Dispense based on patient and insurance preference. Use up to four times daily as directed. (FOR ICD-10 E10.9, E11.9). 1 each 0   cetirizine (ZYRTEC) 10 MG tablet Take 10 mg by mouth daily.     colchicine 0.6 MG tablet Take by mouth.     Dulaglutide (TRULICITY) 3 MG/0.5ML SOPN Inject 3 mg as  directed once a week. 6 mL 3   EMBRACE TALK GLUCOSE TEST test strip USE TO CHECK BLOOD SUGARS FOUR TIMES A DAY BEFORE MEALS AND AT BEDTIME DX E11.29 300 strip 3   esomeprazole (NEXIUM) 40 MG capsule Take 1 capsule (40 mg total) by mouth daily. 30 capsule 0   FARXIGA 10 MG TABS tablet Take 10 mg by mouth daily.     gabapentin (NEURONTIN) 400 MG capsule Take 400 mg by mouth 3 (three) times daily.     Insulin Pen Needle (PEN NEEDLES) 31G X 8 MM MISC Inject 1 Device into the skin in the morning, at noon, in the evening, and at bedtime. 400 each 3   levothyroxine (SYNTHROID) 150 MCG tablet Take 1 tablet (150 mcg total) by mouth daily. Needs appt for future refills 90 tablet 0   naproxen (NAPROSYN) 500 MG tablet Take 500 mg by mouth 2 (two) times daily with a meal.     olmesartan (BENICAR) 20 MG tablet Take 10 mg by mouth daily.     Prodigy Twist Top Lancets 28G MISC USE TO CHECK BLOOD SUGARS     simvastatin (ZOCOR) 20 MG tablet Take 1 tablet (20 mg total) by mouth every evening. 90 tablet 0   spironolactone (ALDACTONE) 25 MG tablet Take 0.5 tablets (12.5 mg total) by mouth daily. Needs appt for future refills 15 tablet 0   tiZANidine (ZANAFLEX) 2 MG tablet Take 2 mg by mouth 2 (two) times daily.     torsemide (DEMADEX) 20 MG tablet Take 4 tablets (80 mg total) by mouth daily. Need appt for future refills 120 tablet 0   TRESIBA FLEXTOUCH 200 UNIT/ML FlexTouch Pen Inject 12 Units into the skin daily at 6 (six) AM. (Patient taking differently: Inject 10 Units into the skin daily at 6 (six) AM.) 30 mL 2   clotrimazole-betamethasone (LOTRISONE) cream Apply 1 application topically 2 (two) times daily. (Patient not taking: Reported on 09/20/2022) 30 g 0   Continuous Blood Gluc Sensor (DEXCOM G7 SENSOR) MISC 1 Device by Does not apply route as directed. (Patient not taking: Reported on 09/20/2022) 9 each 3   hydrOXYzine (ATARAX/VISTARIL) 25 MG tablet Take 0.5 tablets (12.5 mg total) by mouth every 8 (eight) hours as  needed for itching. (Patient not taking: Reported on 09/20/2022) 30 tablet 0   insulin lispro (HUMALOG) 100 UNIT/ML KwikPen MAX DAILY 30 UNITS (Patient not taking: Reported on 09/20/2022) 30 mL 3   linaclotide (LINZESS) 145 MCG CAPS capsule Take 145 mcg by mouth daily as needed (constipation).  (Patient not taking: Reported on 09/20/2022)     nitrofurantoin, macrocrystal-monohydrate, (MACROBID) 100 MG capsule Take 1 capsule (100 mg total) by mouth 2 (two) times daily. (Patient not taking: Reported on 09/20/2022) 10  capsule 0   ondansetron (ZOFRAN ODT) 4 MG disintegrating tablet Take 1 tablet (4 mg total) by mouth every 8 (eight) hours as needed for nausea or vomiting. (Patient not taking: Reported on 09/20/2022) 10 tablet 0   potassium chloride SA (KLOR-CON) 20 MEQ tablet Take 1 tablet (20 mEq total) by mouth daily. Need appt for future refills (Patient not taking: Reported on 09/20/2022) 90 tablet 0   No facility-administered medications prior to visit.     Allergies:   Penicillins, Tramadol, Vicodin [hydrocodone-acetaminophen], Codeine, Iodine-131, Iohexol, Lisinopril, and Sulfa antibiotics   Social History   Socioeconomic History   Marital status: Single    Spouse name: Not on file   Number of children: 2   Years of education: 12   Highest education level: Not on file  Occupational History   Occupation: N/A    Comment: disabled  Tobacco Use   Smoking status: Former    Types: Cigarettes    Quit date: 05/22/1992    Years since quitting: 30.3   Smokeless tobacco: Never  Vaping Use   Vaping Use: Never used  Substance and Sexual Activity   Alcohol use: No    Comment: quit 20 yrs ago   Drug use: No   Sexual activity: Not Currently  Other Topics Concern   Not on file  Social History Narrative   Lives alone   caffeine drinks 2 cups of coffee a day, occasional soda     Right Handed   Social Determinants of Health   Financial Resource Strain: Medium Risk (10/02/2019)   Overall Financial  Resource Strain (CARDIA)    Difficulty of Paying Living Expenses: Somewhat hard  Food Insecurity: No Food Insecurity (10/02/2019)   Hunger Vital Sign    Worried About Running Out of Food in the Last Year: Never true    Ran Out of Food in the Last Year: Never true  Transportation Needs: No Transportation Needs (05/29/2019)   PRAPARE - Administrator, Civil Service (Medical): No    Lack of Transportation (Non-Medical): No  Physical Activity: Not on file  Stress: Not on file  Social Connections: Not on file    Socially, she was born on 1960-06-24.  She was raised in foster care.  She is single.  She has 2 children, both female ages 42 and 10.   Family History:  The patient's family history includes Diabetes in her sister; Glaucoma in her sister; Healthy in her father and mother; Heart attack in her paternal aunt; Hypertension in her sister; Stroke in her brother.   ROS General: Negative; No fevers, chills, or night sweats;  HEENT: Positive for blindness.  Left prosthetic eye, right eye blindness, history of glaucoma and diabetes. Pulmonary: Negative; No cough, wheezing, shortness of breath, hemoptysis Cardiovascular: Negative; No chest pain, presyncope, syncope, palpitations GI: Negative; No nausea, vomiting, diarrhea, or abdominal pain GU: Negative; No dysuria, hematuria, or difficulty voiding Musculoskeletal: Negative; no myalgias, joint pain, or weakness Hematologic/Oncology: Negative; no easy bruising, bleeding Endocrine: Negative; no heat/cold intolerance; no diabetes Neuro: Negative; no changes in balance, headaches Skin: Negative; No rashes or skin lesions Psychiatric: Negative; No behavioral problems, depression Sleep: OSA on CPAP therapy followed by Dr. Frances Furbish at [facility] neurology. Other comprehensive 14 point system review is negative.   PHYSICAL EXAM:   VS:  BP 108/60 (BP Location: Left Arm, Patient Position: Sitting, Cuff Size: Normal)   Pulse 70   Ht 4'  11" (1.499 m)   Wt 171  lb 6.4 oz (77.7 kg)   SpO2 96%   BMI 34.62 kg/m     Repeat blood pressure by me was 122/64.  Wt Readings from Last 3 Encounters:  09/20/22 171 lb 6.4 oz (77.7 kg)  07/20/22 174 lb (78.9 kg)  04/20/22 172 lb 3.2 oz (78.1 kg)    She admits to significant weight reduction over the past year and a half from a peak of 260 down to 170.  General: Alert, oriented, no distress.  Skin: normal turgor, no rashes, warm and dry HEENT: Normocephalic, atraumatic. Pupils equal round and reactive to light; sclera anicteric; extraocular muscles intact;  Nose without nasal septal hypertrophy Mouth/Parynx benign; Mallinpatti scale 4 Neck: No JVD, no carotid bruits; normal carotid upstroke Lungs: clear to ausculatation and percussion; no wheezing or rales Chest wall: without tenderness to palpitation Heart: PMI not displaced, RRR, s1 s2 normal, 1/6 systolic murmur, no diastolic murmur, no rubs, gallops, thrills, or heaves Abdomen: soft, nontender; no hepatosplenomehaly, BS+; abdominal aorta nontender and not dilated by palpation. Back: no CVA tenderness Pulses 2+ Musculoskeletal: full range of motion, normal strength, no joint deformities Extremities: no clubbing cyanosis or edema, Homan's sign negative  Neurologic: grossly nonfocal; Cranial nerves grossly wnl Psychologic: Normal mood and affect   Studies/Labs Reviewed:   Sep 20, 2022 ECG (independently read by me): NSR at 70, no ectopy, No ST changes, normal intervals  Recent Labs:    Latest Ref Rng & Units 04/16/2022    8:38 PM 04/15/2022    9:44 AM 12/10/2021    8:47 PM  BMP  Glucose 70 - 99 mg/dL 161  096  045   BUN 8 - 23 mg/dL 25  23  19    Creatinine 0.44 - 1.00 mg/dL 4.09  8.11  9.14   Sodium 135 - 145 mmol/L 141  144  144   Potassium 3.5 - 5.1 mmol/L 3.1  3.4  4.3   Chloride 98 - 111 mmol/L 112  110  111   CO2 22 - 32 mmol/L 19  28  24    Calcium 8.9 - 10.3 mg/dL 8.1  9.7  8.9         Latest Ref Rng & Units  04/13/2020   11:50 AM 11/22/2019   10:17 PM 07/11/2019    3:51 PM  Hepatic Function  Total Protein 6.0 - 8.5 g/dL 7.7  7.3  7.7   Albumin 3.8 - 4.8 g/dL 4.0  3.2  3.3   AST 0 - 40 IU/L 11  17  16    ALT 0 - 32 IU/L 14  19  17    Alk Phosphatase 44 - 121 IU/L 139  94  101   Total Bilirubin 0.0 - 1.2 mg/dL 0.2  0.8  0.6        Latest Ref Rng & Units 04/16/2022    8:38 PM 04/15/2022    9:44 AM 12/10/2021    8:47 PM  CBC  WBC 4.0 - 10.5 K/uL 7.4  6.2  8.2   Hemoglobin 12.0 - 15.0 g/dL 78.2  95.6  21.3   Hematocrit 36.0 - 46.0 % 36.5  36.8  37.2   Platelets 150 - 400 K/uL 160  167  209    Lab Results  Component Value Date   MCV 86.7 04/16/2022   MCV 85.2 04/15/2022   MCV 84.7 12/10/2021   Lab Results  Component Value Date   TSH 2.270 04/13/2020   Lab Results  Component Value Date   HGBA1C  8.7 (A) 07/20/2022     BNP    Component Value Date/Time   BNP 70.5 04/23/2020 1158    ProBNP    Component Value Date/Time   PROBNP 120 07/26/2018 1058   PROBNP 158.3 (H) 08/13/2011 1730     Lipid Panel     Component Value Date/Time   CHOL 126 04/13/2020 1150   TRIG 95 04/13/2020 1150   HDL 42 04/13/2020 1150   CHOLHDL 3.0 04/13/2020 1150   CHOLHDL 3.0 10/22/2012 1351   VLDL 22 10/22/2012 1351   LDLCALC 66 04/13/2020 1150   LABVLDL 18 04/13/2020 1150     RADIOLOGY: No results found.   Additional studies/ records that were reviewed today include:  I reviewed the records of [facility].  Records of [facility] neurology were reviewed.   Prior echo from July 06, 2015 and most recent echo from April 05, 2018 were reviewed as noted in the HPI.  ASSESSMENT:    1. Chronic diastolic heart failure (HCC)   2. Essential hypertension   3. Type 2 diabetes mellitus with complication, with long-term current use of insulin (HCC)   4. Blindness of left eye, unspecified right eye visual impairment category   5. Hyperlipidemia with target LDL less than 70    6. OSA (obstructive sleep apnea) on CPAP   7. Stage 3b chronic kidney disease [facility])     PLAN:  Ms. Aryiah Nuanez is a 62 year old female who has a longstanding history of diabetes mellitus, glaucoma, and currently is blind.  She had previously lost her right eye and has a right eye prosthesis for many years but since May 2020 lost sight in her left eye secondary to glaucoma and diabetes.  She has a history of hypertension, diabetes mellitus, diabetic autonomic neuropathy for which she had seen Dr. Marjory Lies in 2017, as well as a history of asthma, hypothyroidism, nephropathy, and severe obstructive sleep apnea now on CPAP therapy.  She was previously noted to have diastolic heart failure in February 2017.  An echo Doppler study at that time showed EF at 60 to 65% with normal wall motion but features were consistent with grade 2 diastolic dysfunction and Doppler parameters were consistent with high ventricular filling pressure.  A subsequent echo Doppler study in November 2019 continue to show normal systolic function with a EF at 60 to 65% and diastolic dysfunction had improved to grade 1.  Valves were essentially normal.  Patient has been very successful with significant weight loss from a peak weight of 260 pounds down to her current rate at approximately 171 pounds.  As result, her blood pressure requirements have reduced and she is now on olmesartan 20 mg and is no longer on previous torsemide or spironolactone.  On exam today blood pressure is stable at 122/64.  She is on levothyroxine 150 mcg for hypothyroidism.  She is diabetic on insulin, Trulicity, and [place].  She takes simvastatin 20 mg for hyperlipidemia.  A prior lipid panel in 2021 had shown total cholesterol 126, triglycerides 95, LDL cholesterol 66 with VLDL cholesterol at 18.  She feels improved with her significant purposeful weight loss.  She does not have any chest pain and prior shortness of breath has improved with improvement in her  diastolic dysfunction and weight loss.  She is euvolemic on exam.  I have recommended that she undergo a 5-year follow-up echo Doppler study for reassessment of LV systolic and diastolic function.   She is using CPAP therapy with significant benefit  and is followed by Dr. Clyda Hurdle for her severe sleep apnea.  She has CKD stage IIIb with prior laboratory in December 2023 showing an estimated GFR 34 with creatinine 1.68.  Clinically she is without chest pain or palpitations.  I discussed potential adverse cardiovascular consequences of untreated sleep apnea.  She admits to excellent CPAP compliance followed by Dr. Frances Furbish.  In the future I would recommend follow-up laboratory with chemistry, lipid studies, consider LP(a).  At present she will continue current therapy.  I will contact her regarding her echo Doppler results.  As long as she is stable I will see her in 1 year for reassessment or as needed.   Medication Adjustments/Labs and Tests Ordered: Current medicines are reviewed at length with the patient today.  Concerns regarding medicines are outlined above.  Medication changes, Labs and Tests ordered today are listed in the Patient Instructions below. Patient Instructions   Testing/Procedures:  Your physician has requested that you have an echocardiogram. Echocardiography is a painless test that uses sound waves to create images of your heart. It provides your doctor with information about the size and shape of your heart and how well your heart's chambers and valves are working. This procedure takes approximately one hour. There are no restrictions for this procedure. Please do NOT wear cologne, perfume, aftershave, or lotions (deodorant is allowed). Please arrive 15 minutes prior to your appointment time. [address]   Follow-Up: At [facility], you and your health needs are our priority.  As part of our continuing mission to provide you with exceptional heart care, we have  created designated Provider Care Teams.  These Care Teams include your primary Cardiologist (physician) and Advanced Practice Providers (APPs -  Physician Assistants and Nurse Practitioners) who all work together to provide you with the care you need, when you need it.  We recommend signing up for the patient portal called "MyChart".  Sign up information is provided on this After Visit Summary.  MyChart is used to connect with patients for Virtual Visits (Telemedicine).  Patients are able to view lab/test results, encounter notes, upcoming appointments, etc.  Non-urgent messages can be sent to your provider as well.   To learn more about what you can do with MyChart, go to [email or web address].    Your next appointment:    AS NEEDED   Signed, Nicki Guadalajara, MD  09/26/2022 12:06 PM    [facility] [address], [address], [place], [place]  [address] Phone: [phone]

## 2022-09-26 ENCOUNTER — Other Ambulatory Visit: Payer: 59

## 2022-09-26 ENCOUNTER — Encounter: Payer: Self-pay | Admitting: Cardiovascular Disease

## 2022-10-19 ENCOUNTER — Ambulatory Visit: Payer: 59

## 2022-10-19 ENCOUNTER — Other Ambulatory Visit (INDEPENDENT_AMBULATORY_CARE_PROVIDER_SITE_OTHER): Payer: 59 | Admitting: Podiatry

## 2022-10-19 DIAGNOSIS — L84 Corns and callosities: Secondary | ICD-10-CM

## 2022-10-19 DIAGNOSIS — M2041 Other hammer toe(s) (acquired), right foot: Secondary | ICD-10-CM

## 2022-10-19 DIAGNOSIS — M2011 Hallux valgus (acquired), right foot: Secondary | ICD-10-CM

## 2022-10-19 DIAGNOSIS — M2012 Hallux valgus (acquired), left foot: Secondary | ICD-10-CM

## 2022-10-19 DIAGNOSIS — M2042 Other hammer toe(s) (acquired), left foot: Secondary | ICD-10-CM

## 2022-10-19 DIAGNOSIS — E1142 Type 2 diabetes mellitus with diabetic polyneuropathy: Secondary | ICD-10-CM

## 2022-10-19 DIAGNOSIS — Z794 Long term (current) use of insulin: Secondary | ICD-10-CM

## 2022-10-19 DIAGNOSIS — E1140 Type 2 diabetes mellitus with diabetic neuropathy, unspecified: Secondary | ICD-10-CM

## 2022-10-19 NOTE — Progress Notes (Signed)
1. Diabetic peripheral neuropathy associated with type 2 diabetes mellitus (HCC)   2. Hallux valgus, acquired, bilateral   3. Acquired hammertoes of both feet    Orders Placed This Encounter  Procedures   For Home Use Only DME Diabetic Shoe    Dispense one pair extra depth shoes and 3 pair total contact insoles. I prefer shoes with stretchable uppers.   Freddie Breech, DPM

## 2022-10-19 NOTE — Progress Notes (Signed)
Patient presents today to be measured for diabetic shoes and insoles.  Patient was measured for 1 pair of diabetic shoes and 3 pairs of foam casted diabetic insoles.   Shoe sizes 6 m  will order 6.5 (pt concerned her nails grow quickly and she does not want them to rub)   Type shoe [id number]   Diabetes Physician Dr Lonzo Cloud   Re-appointment for regularly scheduled diabetic foot care visits or if they should experience any trouble with the shoes or insoles.

## 2022-10-27 ENCOUNTER — Ambulatory Visit (HOSPITAL_COMMUNITY): Payer: 59

## 2022-11-08 IMAGING — CR DG KNEE 1-2V*L*
2 series · 2 of 2 positions shown · non-contrast
Comparison: None.

CLINICAL DATA: Fall 3 days ago now with pain involving the anterior
aspect of the patella.

EXAM:
LEFT KNEE - 1-2 VIEW

[t knee lat left]
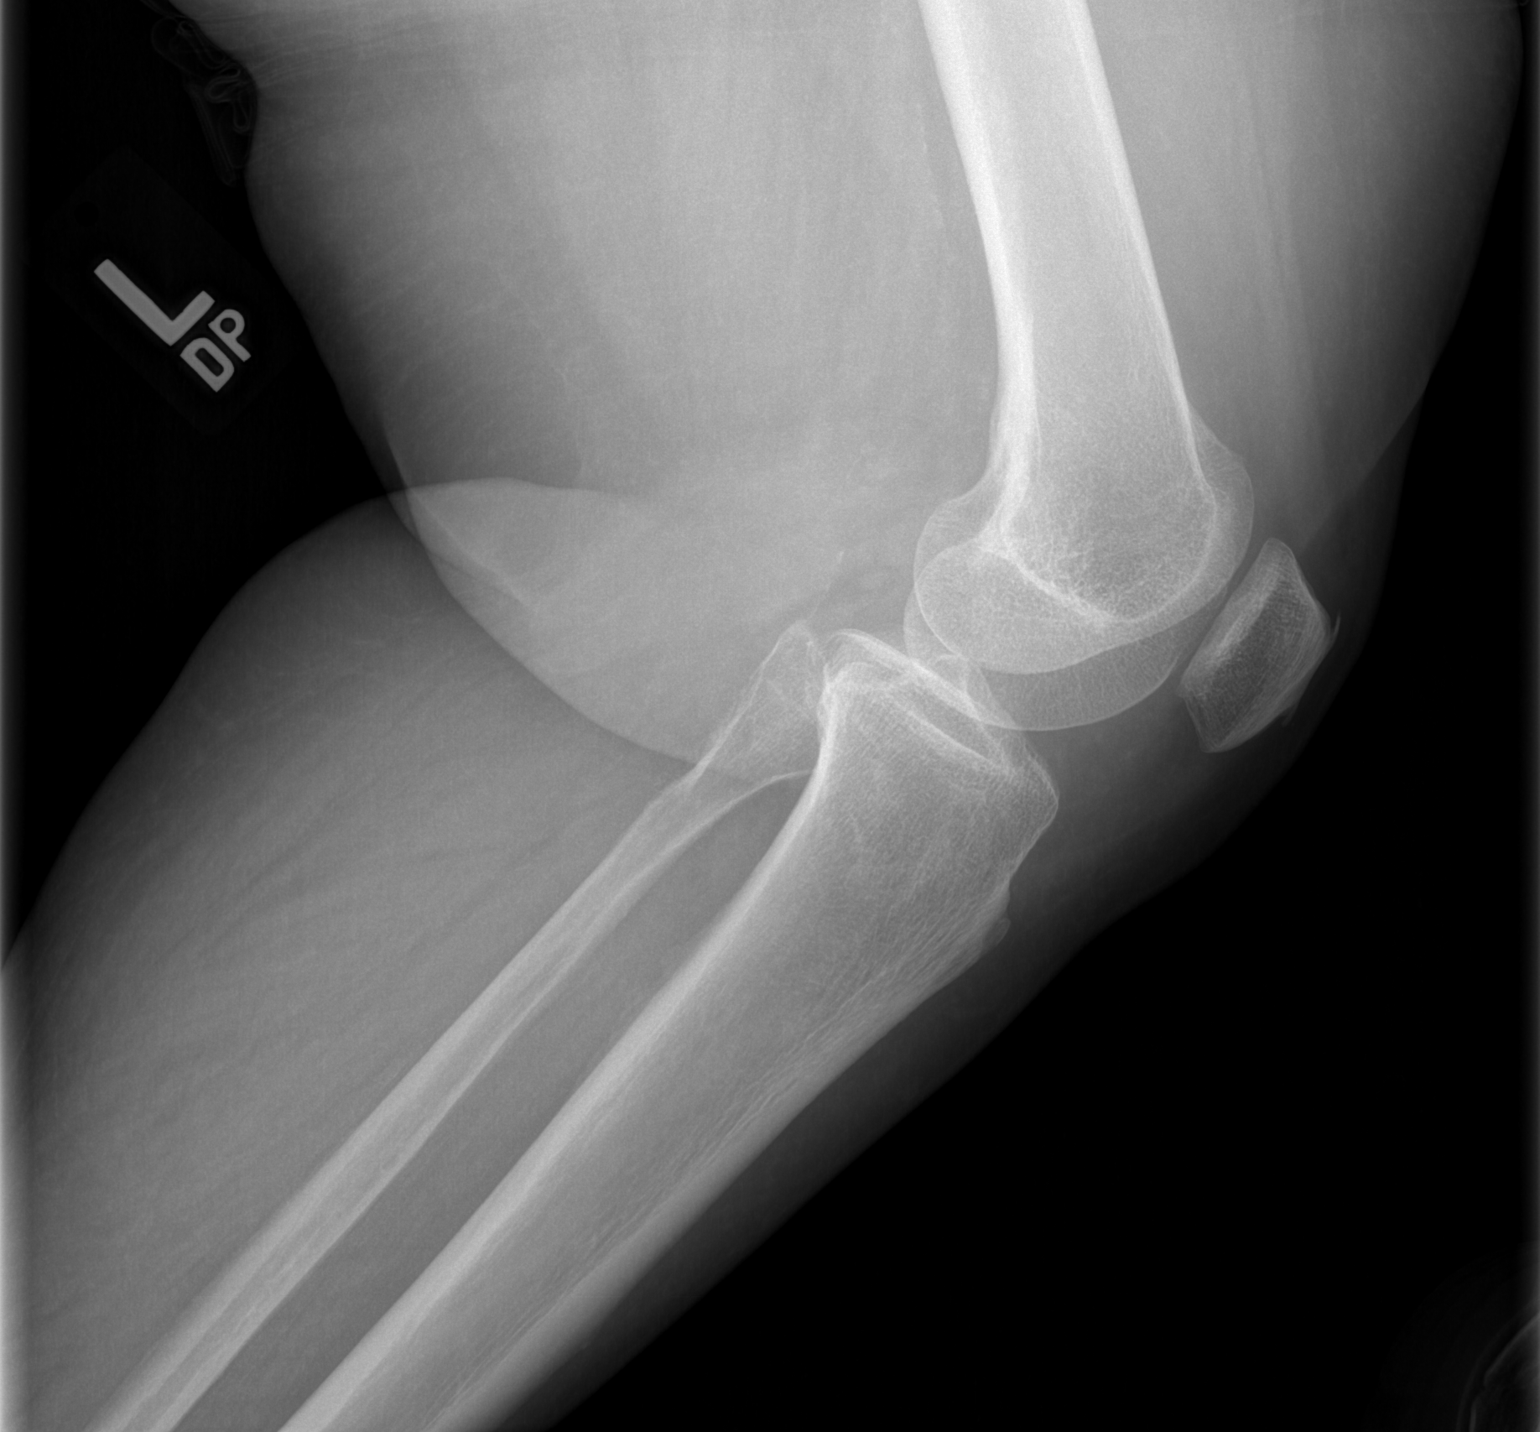

[t knee ap left]
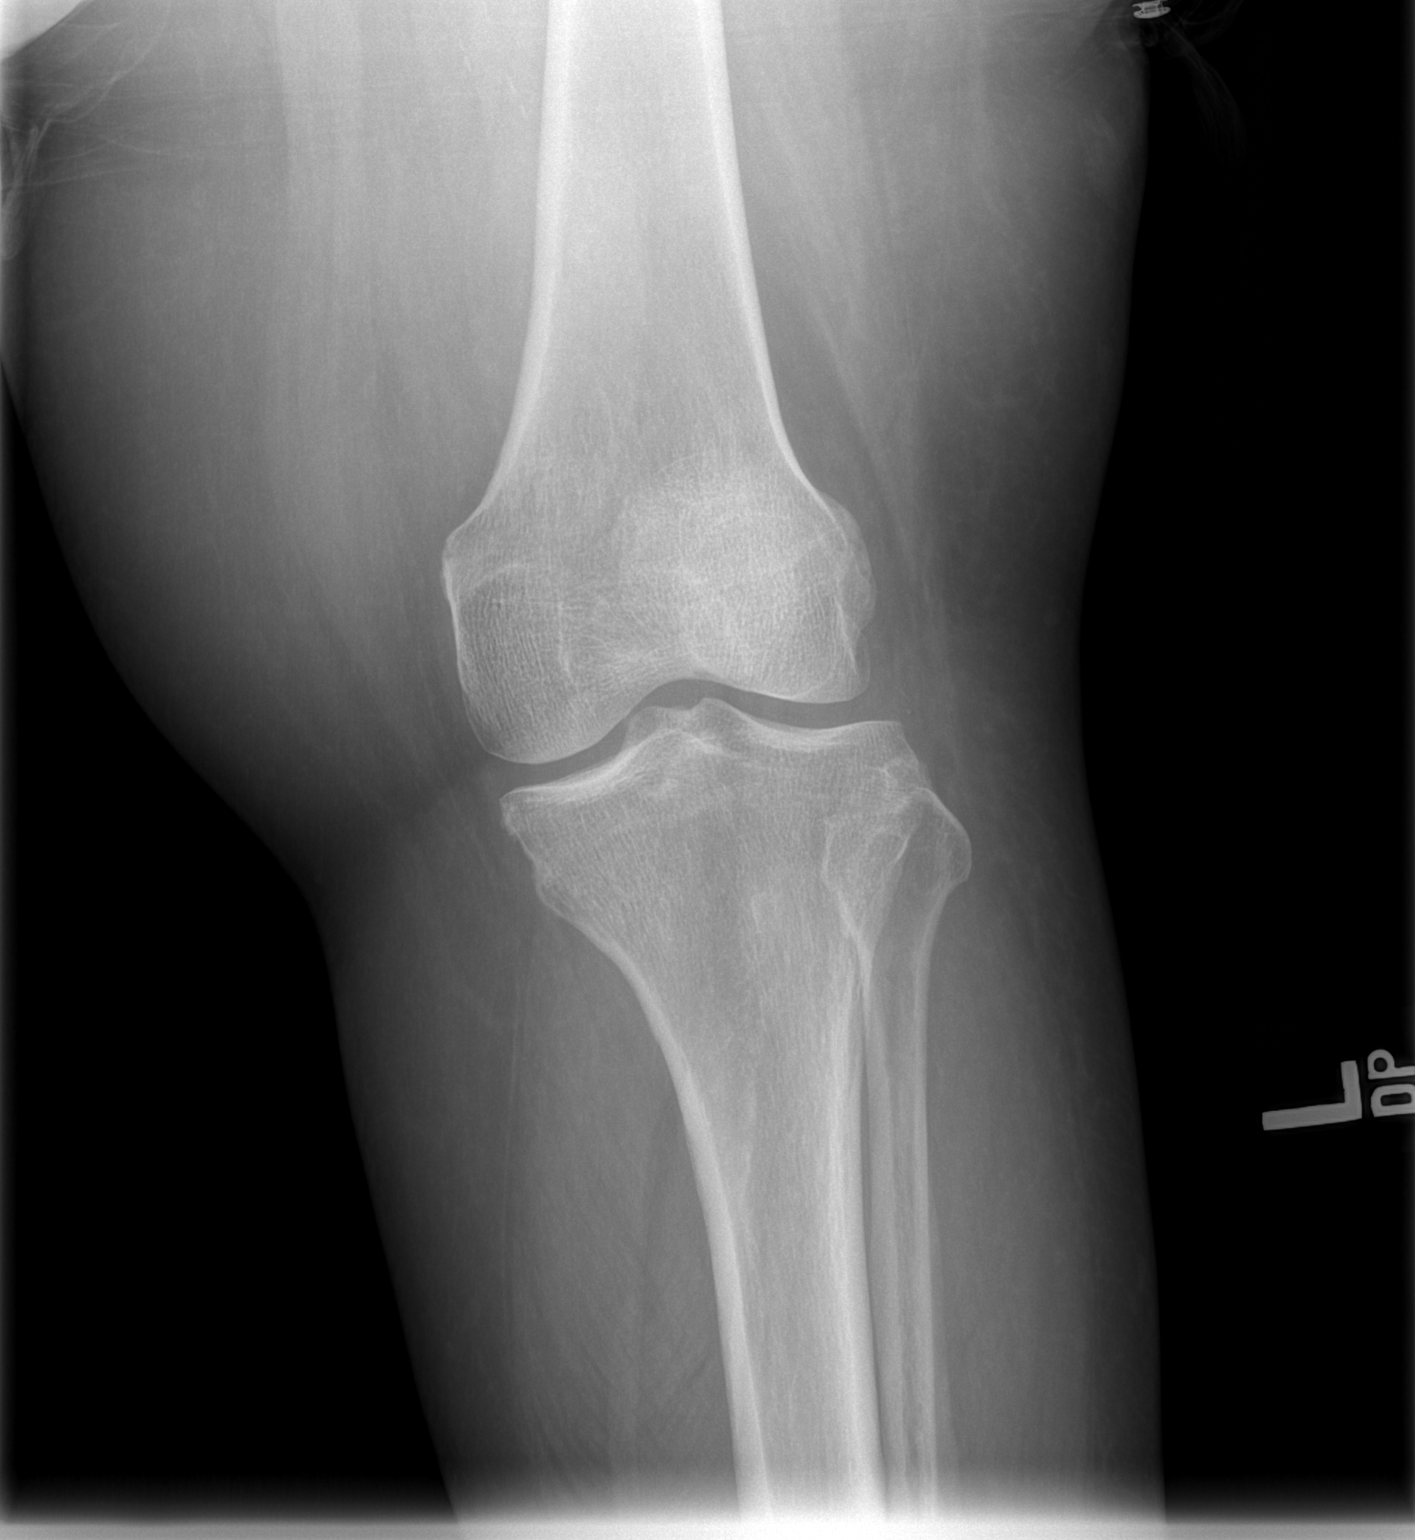

[2 of 2 positions shown; findings below may reference images not displayed]

FINDINGS: No fracture or dislocation. Minimal enthesopathic change involving
the anterior aspect of the patella. No joint effusion or evidence of
lipohemarthrosis. No evidence of chondrocalcinosis. Regional soft
tissues appear normal. No radiopaque foreign body.
IMPRESSION: 1. No acute findings.
2. Minimal enthesopathic change involving the anterior aspect of the
patella.

## 2022-11-08 IMAGING — CR DG LUMBAR SPINE COMPLETE 4+V
5 series · 5 of 5 positions shown · non-contrast
Comparison: None.

CLINICAL DATA: Post fall 3 days ago with persistent left-sided back
pain.

EXAM:
LUMBAR SPINE - COMPLETE 4+ VIEW

[t l-spine a.p.]
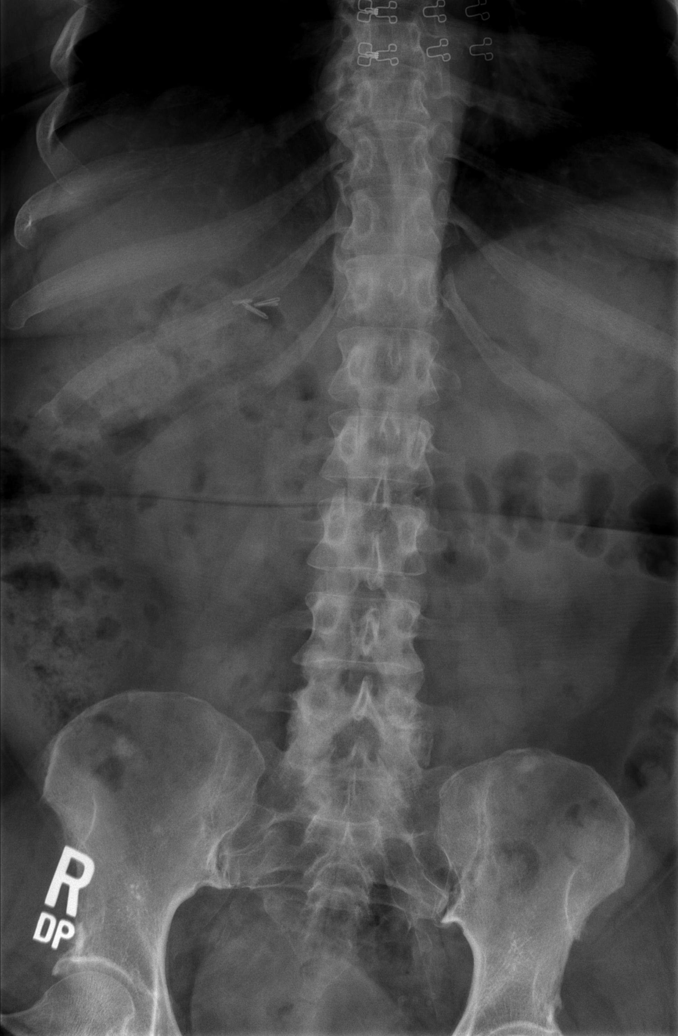

[t l-spine oblique exposure (1 of 2)]
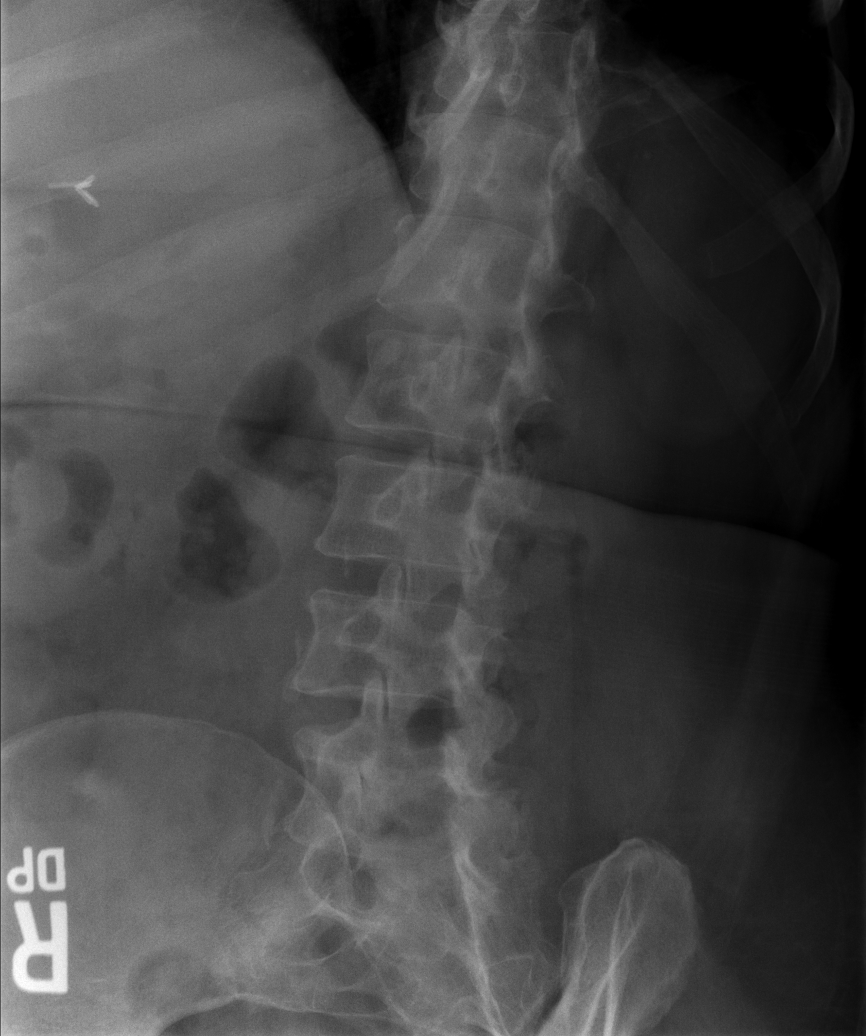

[t l-spine oblique exposure (2 of 2)]
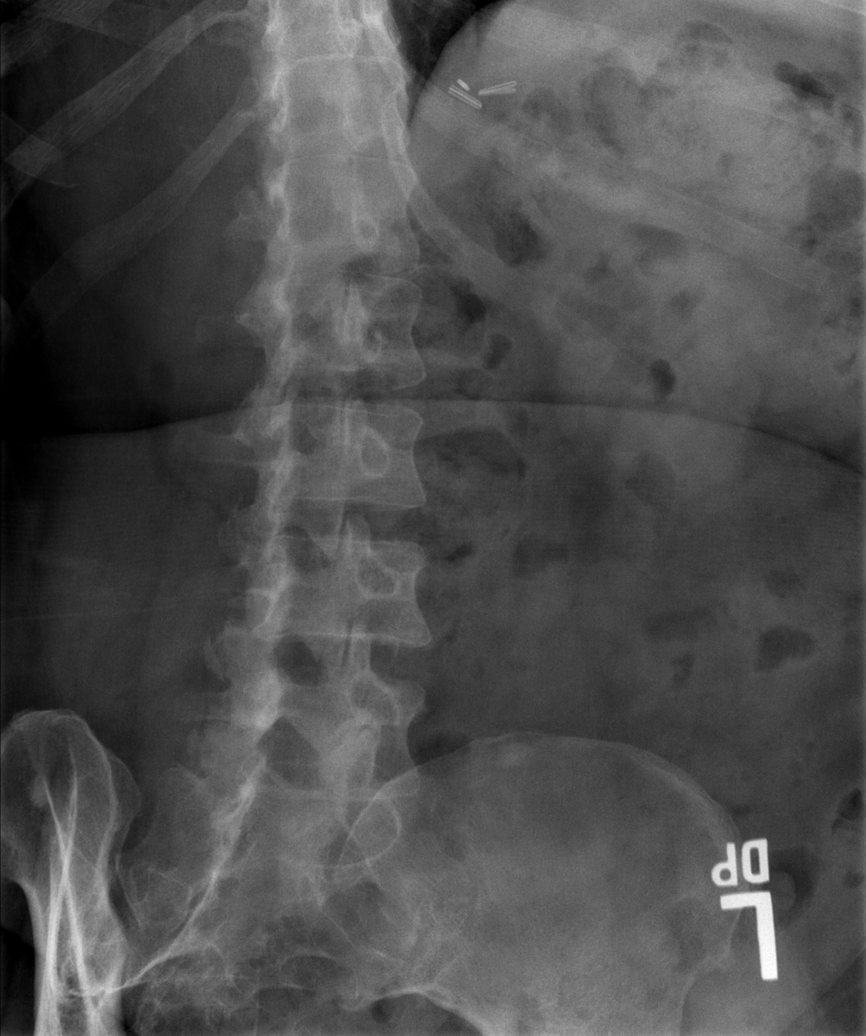

[t l-spine lat]
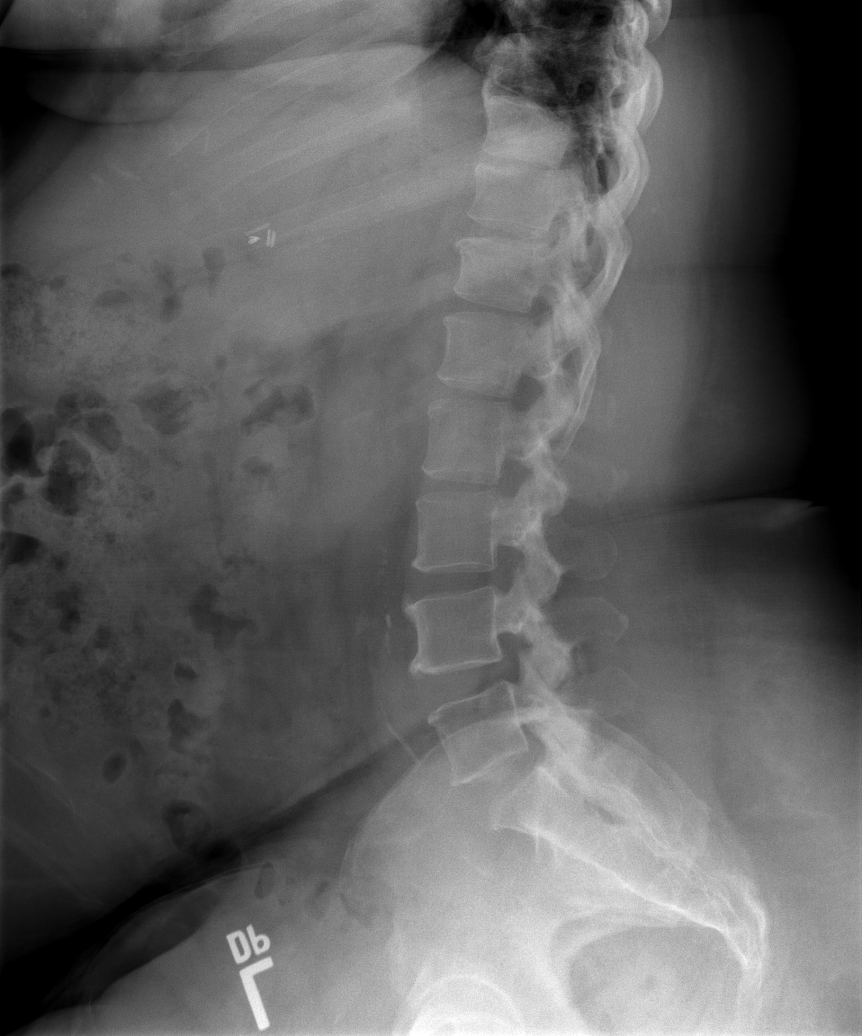

[t l-spine l5-s1 spot]
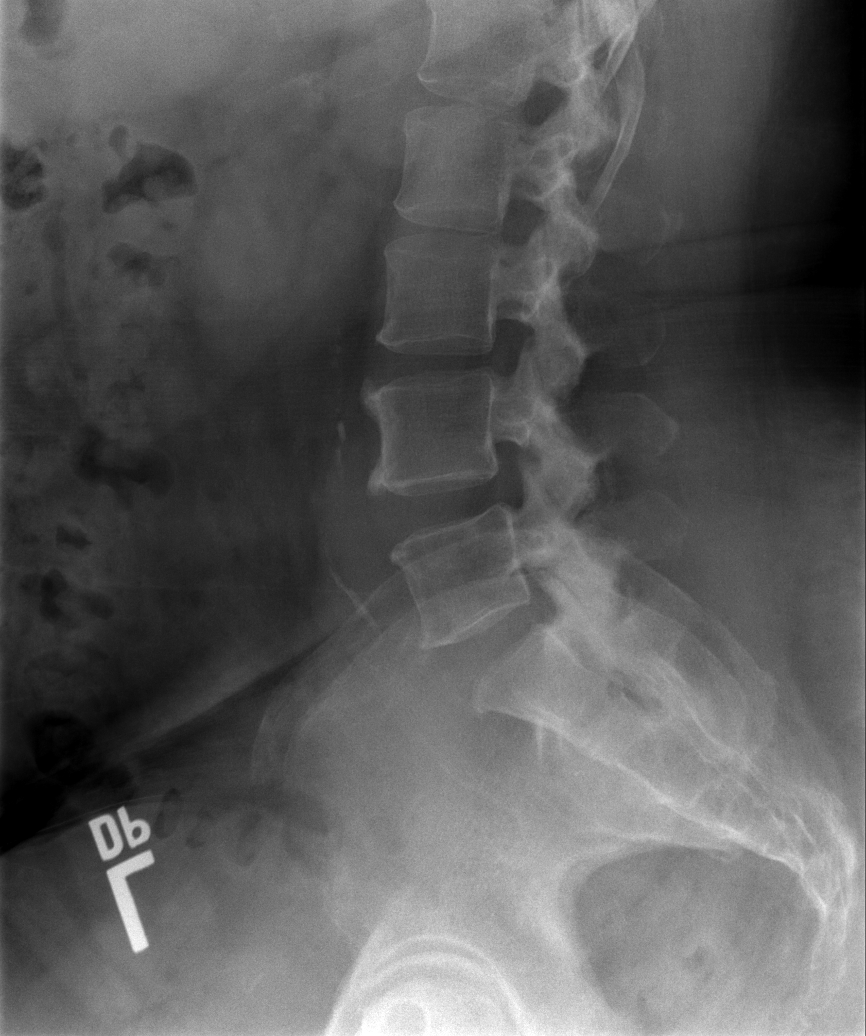

[5 of 5 positions shown; findings below may reference images not displayed]

FINDINGS: There are 5 non rib-bearing lumbar type vertebral bodies.

Normal alignment of the lumbar spine. No anterolisthesis or
retrolisthesis. Bilateral facets are normally aligned.

Mild (approximately 5%) compression deformity involving the superior
endplate of the T12 vertebral body is similar to noncontrast
abdominal CT performed [DATE]. Remaining lumbar vertebral body
heights appear preserved.

Mild multilevel lumbar spine DDD, worse at L3-L4 with disc space
height loss, endplate irregularity and anteriorly directed
osteophytosis.

Stigmata of dish within the mid/caudal aspect of the thoracic spine.

Limited visualization the bilateral SI joints is normal.

Post cholecystectomy.  Nonobstructive bowel gas pattern.
IMPRESSION: 1. No acute findings.
2. Mild multilevel lumbar spine DDD, worse at L3-L4, similar to
abdominal CT performed 11/23/2019.

## 2022-11-16 ENCOUNTER — Ambulatory Visit (HOSPITAL_COMMUNITY): Payer: 59 | Attending: Cardiovascular Disease

## 2022-11-16 DIAGNOSIS — I10 Essential (primary) hypertension: Secondary | ICD-10-CM | POA: Diagnosis not present

## 2022-11-16 DIAGNOSIS — I5032 Chronic diastolic (congestive) heart failure: Secondary | ICD-10-CM | POA: Insufficient documentation

## 2022-11-16 LAB — ECHOCARDIOGRAM COMPLETE
Area-P 1/2: 4.49 cm2
S' Lateral: 2.4 cm

## 2022-11-22 ENCOUNTER — Encounter: Payer: Self-pay | Admitting: Podiatry

## 2022-11-22 ENCOUNTER — Ambulatory Visit (INDEPENDENT_AMBULATORY_CARE_PROVIDER_SITE_OTHER): Payer: 59 | Admitting: Podiatry

## 2022-11-22 VITALS — BP 98/60 | HR 68

## 2022-11-22 DIAGNOSIS — L84 Corns and callosities: Secondary | ICD-10-CM | POA: Diagnosis not present

## 2022-11-22 DIAGNOSIS — E1151 Type 2 diabetes mellitus with diabetic peripheral angiopathy without gangrene: Secondary | ICD-10-CM | POA: Diagnosis not present

## 2022-11-22 DIAGNOSIS — B351 Tinea unguium: Secondary | ICD-10-CM | POA: Diagnosis not present

## 2022-11-22 NOTE — Progress Notes (Unsigned)
Subjective:  Patient ID: Amy [name], female    DOB: 08/21/1960,  MRN: [id number]  Amy [name] presents to clinic today for:  Chief Complaint  Patient presents with   Diabetes    "Toenails"  . Patient notes nails are thick and elongated, causing pain in shoe gear when ambulating.  She also has calluses along the periphery of both heels.  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  There is a caregiver with her today.  Her endocrinologist is Dr. Lonzo Cloud.  Date last seen is 07/20/2022.  Allergies  Allergen Reactions   Penicillins Hives and Swelling    Has patient had a PCN reaction causing immediate rash, facial/tongue/throat swelling, SOB or lightheadedness with hypotension: yes- face swelling Has patient had a PCN reaction causing severe rash involving mucus membranes or skin necrosis: no Has patient had a PCN reaction that required hospitalization unknown (childhood allergy) Has patient had a PCN reaction occurring within the last 10 years: no If all of the above answers are "NO", then may proceed with Cephalosporin use.    Tramadol Nausea And Vomiting    Intense nausea   Vicodin [Hydrocodone-Acetaminophen] Nausea And Vomiting   Codeine Nausea Only    Other reaction(s): Unknown   Iodine-131 Hives   Iohexol Hives    Pt developed itching and hives along with nasal congestion; needs 13 hour premeds for future studies, Onset Date: [id number]    Lisinopril Cough    Other reaction(s): Unknown   Sulfa Antibiotics Nausea And Vomiting    Review of Systems: Negative except as noted in the HPI.  Objective:  Vitals:   11/22/22 1028  BP: 98/60  Pulse: 68    Amy [name] is a pleasant 62 y.o. female in NAD. AAO x 3.  Vascular Examination: Patient has palpable DP pulse, absent PT pulse bilateral.  Delayed capillary refill bilateral toes.  Sparse digital hair bilateral.  Proximal to distal cooling WNL bilateral.    Dermatological Examination: Interspaces are  clear with no open lesions noted bilateral.  Nails are 3-71mm thick, with yellowish/brown discoloration, subungual debris and distal onycholysis x10.  There is pain with compression of nails x10.  There are hyperkeratotic lesions noted along the periphery of both heels.  They are diffuse and not excessively thickened.  Neurological Examination: Protective sensation intact b/l LE. Vibratory sensation absent b/l LE.     Latest Ref Rng & Units 07/20/2022   10:26 AM 01/19/2022   10:32 AM  Hemoglobin A1C  Hemoglobin-A1c 4.0 - 5.6 % 8.7  7.4    Patient qualifies for at-risk foot care because of diabetes with PVD.  Assessment/Plan: 1. Type II diabetes mellitus with peripheral circulatory disorder (HCC)   2. Dermatophytosis of nail   3. Callus of foot     Mycotic nails x10 were sharply debrided with sterile nail nippers and power debriding burr to decrease bulk and length.  Hyperkeratotic lesions on bilateral heels were shaved with #312 blade.   Return in about 3 months (around 02/22/2023) for [facility].   Clerance Lav, DPM, FACFAS [facility]     [address], [place] [address]  Office [phone]  Fax [phone]

## 2022-11-29 IMAGING — DX DG ELBOW COMPLETE 3+V*L*
3 series · 3 of 3 positions shown · non-contrast
Comparison: None.

CLINICAL DATA: Pain and swelling with decreased range of motion

EXAM:
LEFT ELBOW - COMPLETE 3+ VIEW

[elbow obl (1 of 2)]
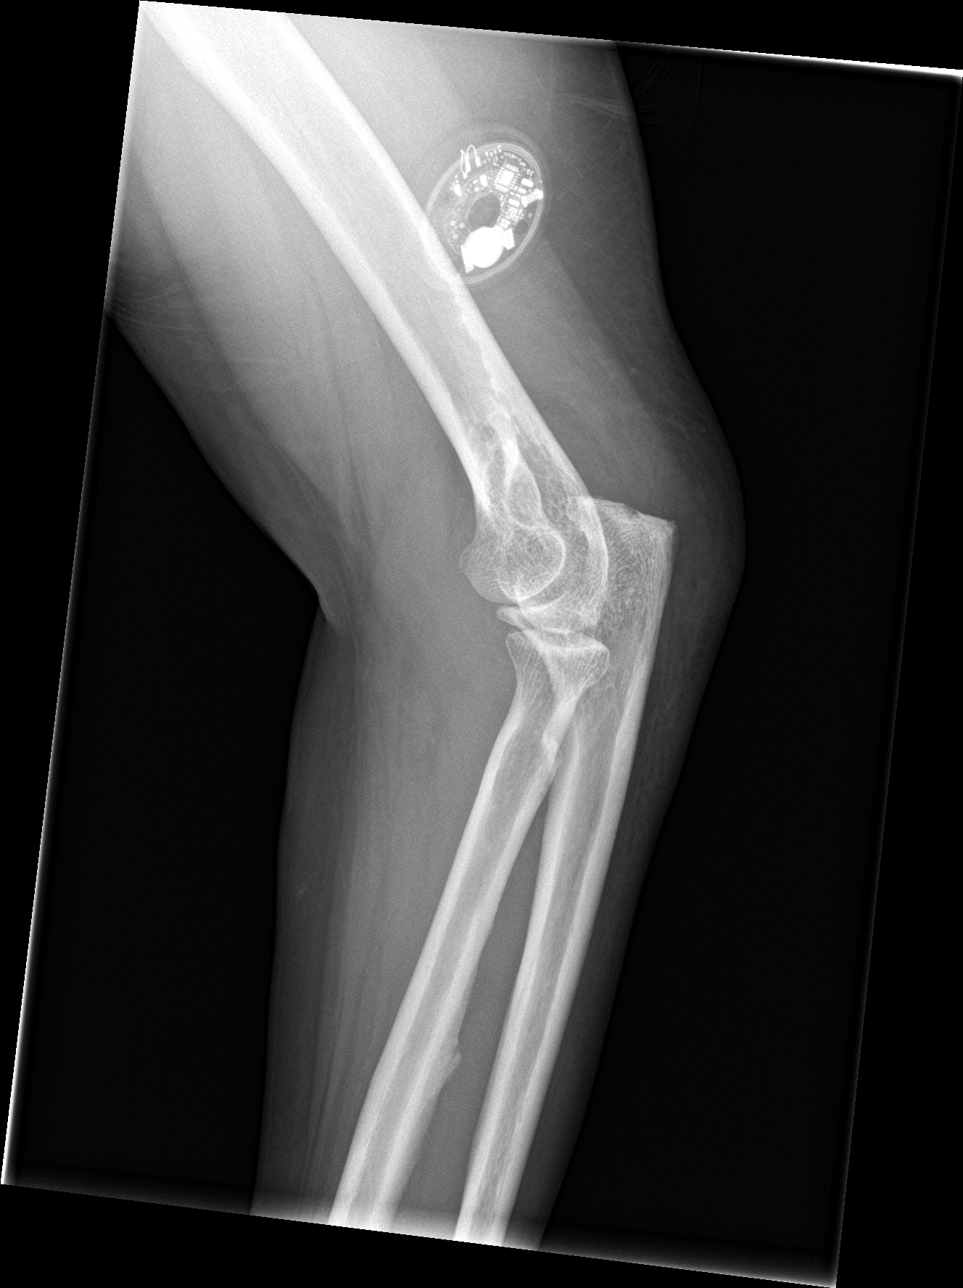

[elbow obl (2 of 2)]
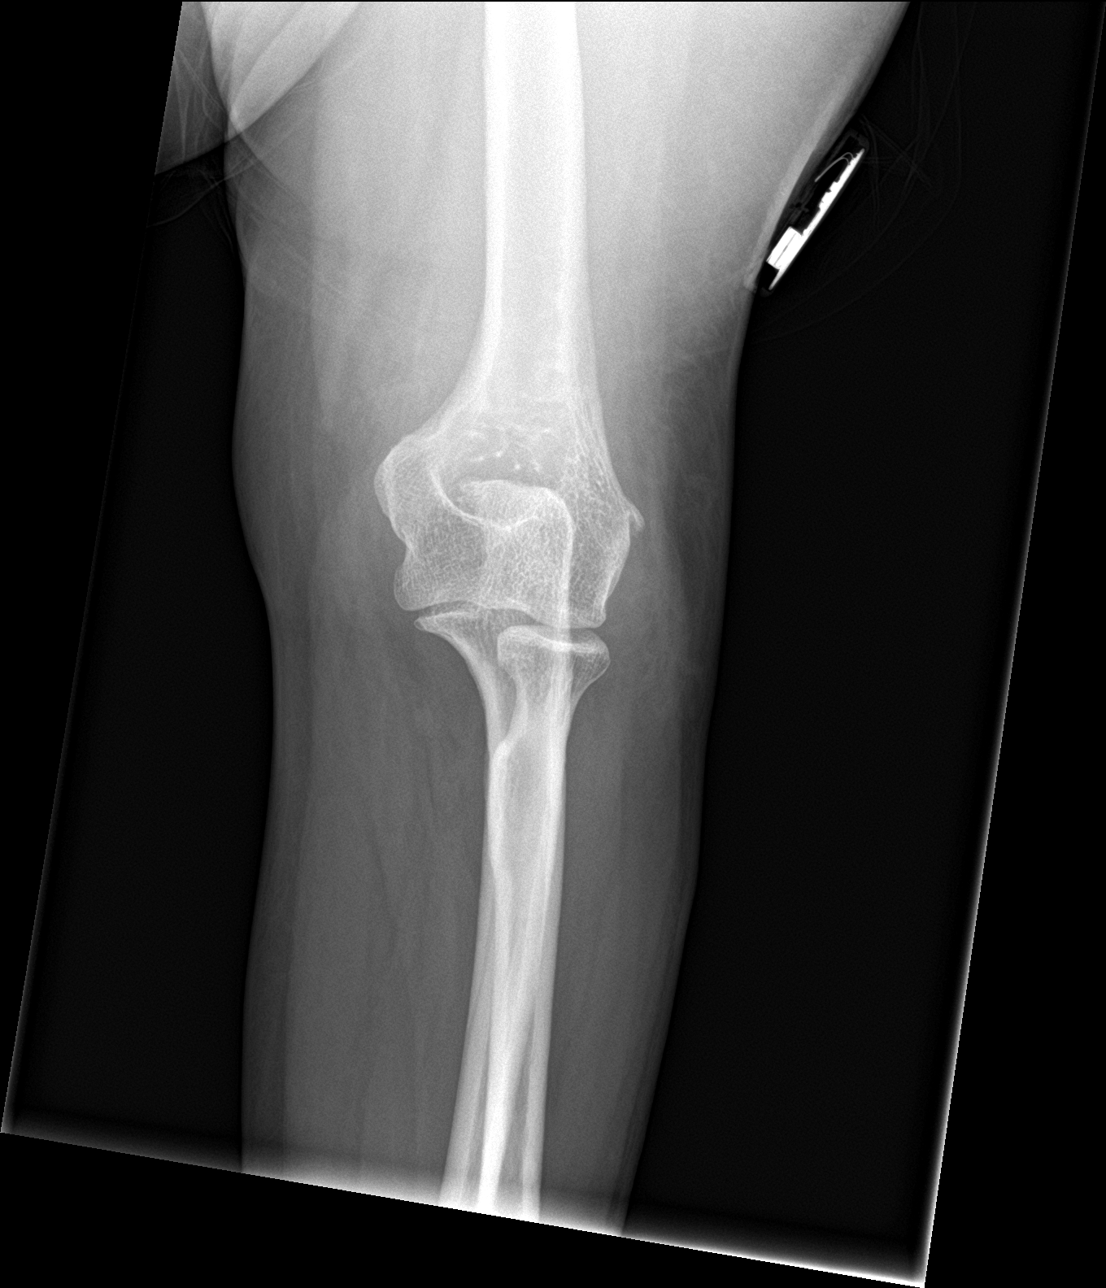

[elbow lat]
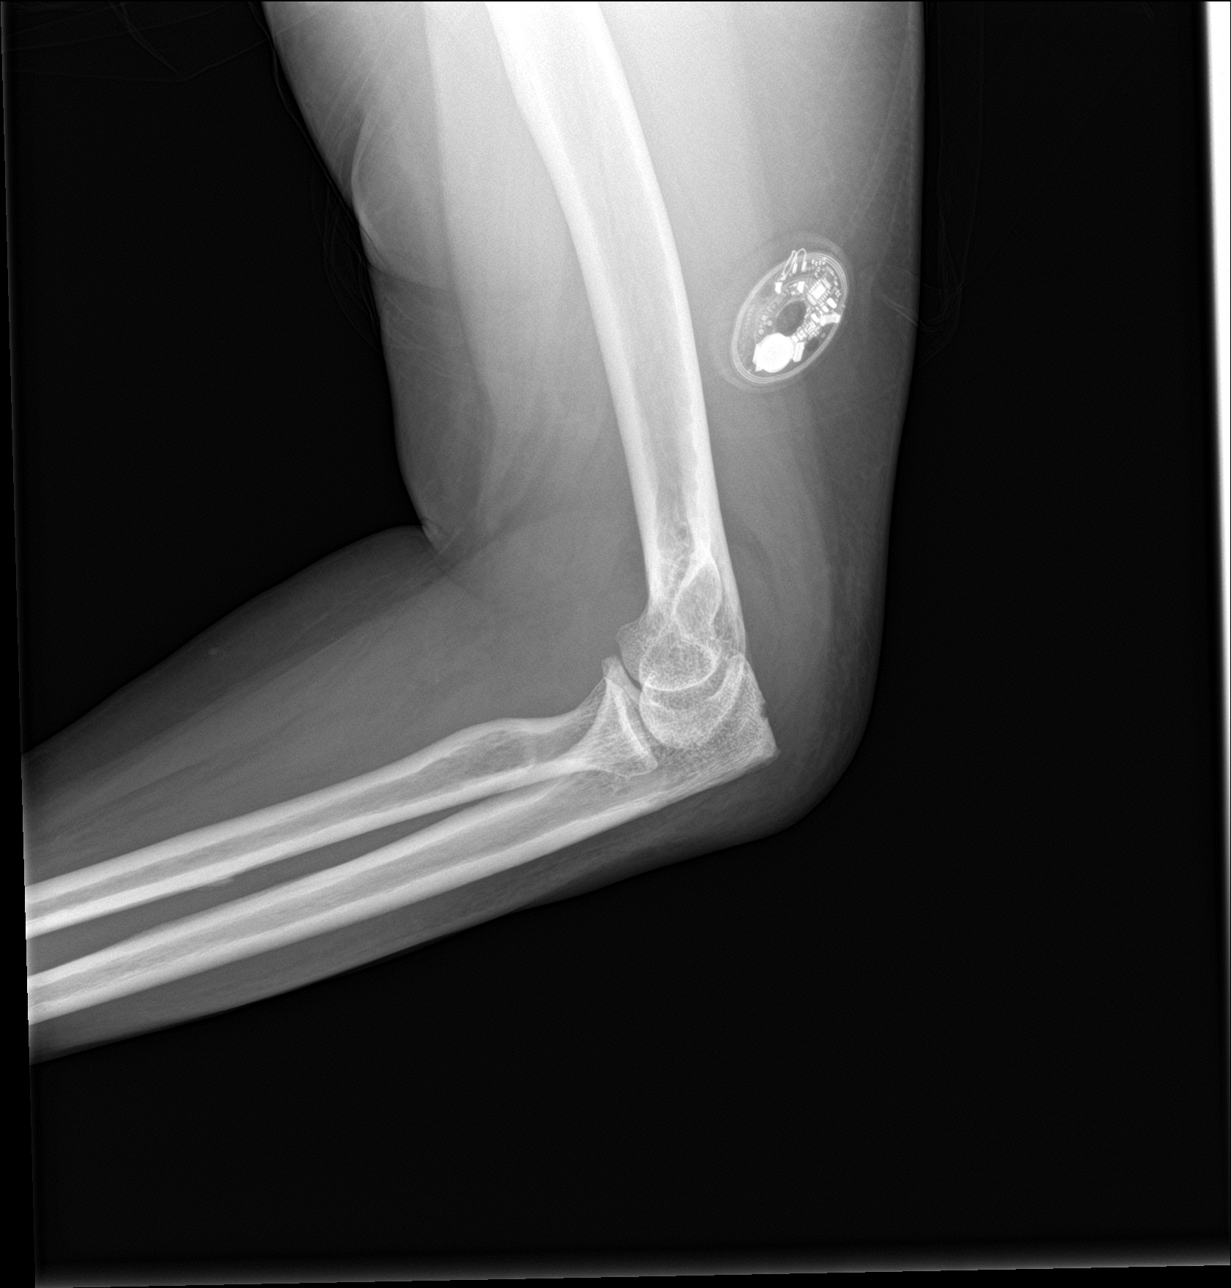

[3 of 3 positions shown; findings below may reference images not displayed]

FINDINGS: Frontal, oblique, and lateral views were obtained. There is soft
tissue swelling. There is no acute fracture or dislocation evident.
There is a sizable joint effusion, however. There is a subtle
lucency measuring just over 2 mm along the posterior aspect of the
proximal most portion of the ulna. No similar areas of questionable
lytic change noted. No appreciable joint space narrowing.
IMPRESSION: 1.  Large joint effusion of uncertain etiology.

2. Small lytic appearing area along the posterior most aspect of the
proximal ulna. Etiology for this small area of lucency uncertain.
This finding coupled with the joint effusion may warrant elbow MR to
further evaluate.

3. No fracture or dislocation evident by radiography. No appreciable
joint space narrowing.

These results will be called to the ordering clinician or
representative by the Radiologist Assistant, and communication
documented in the PACS or [REDACTED].

## 2022-12-28 IMAGING — MR MR ELBOW*L* W/O CM
4 of 5 series · 13 of 40 positions shown · non-contrast
Comparison: Radiographs 10/27/1998 22

CLINICAL DATA: Posterior and lateral elbow pain for 1 month.

EXAM:
MRI OF THE LEFT ELBOW WITHOUT CONTRAST
TECHNIQUE: Multiplanar, multisequence MR imaging of the elbow was performed. No
intravenous contrast was administered.

[Series 5: T1 · axial · left · 3.0mm · 0.16mm/px · z∈[+31,+104]mm · 3 of 27 slices shown]
[im 4/27]
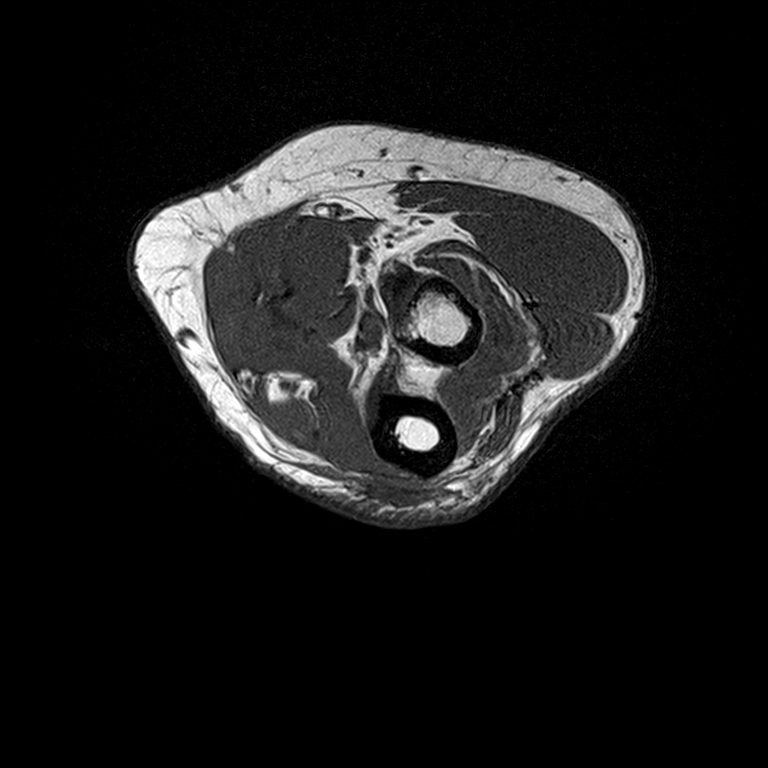
[im 14/27]
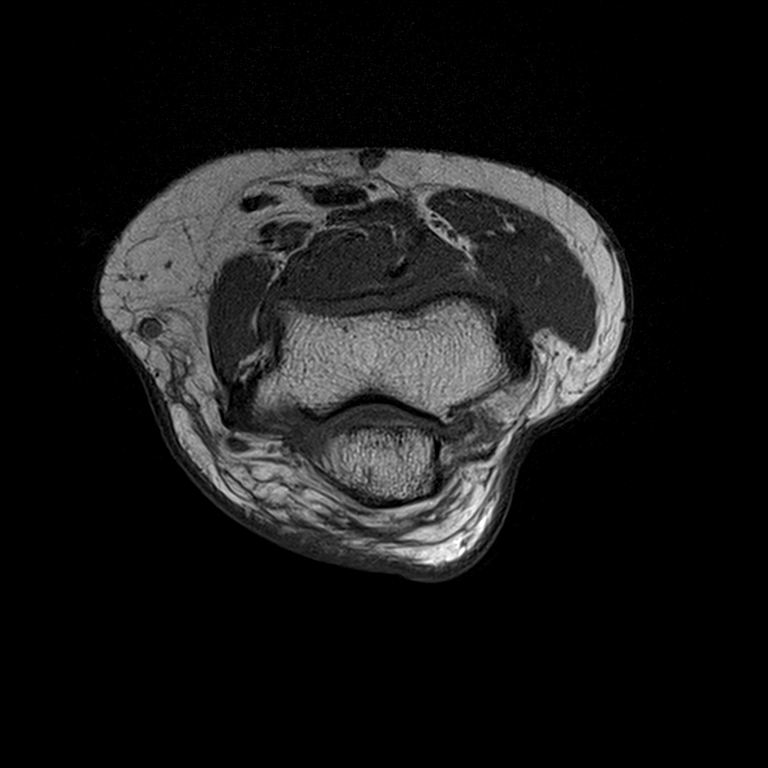
[im 23/27]
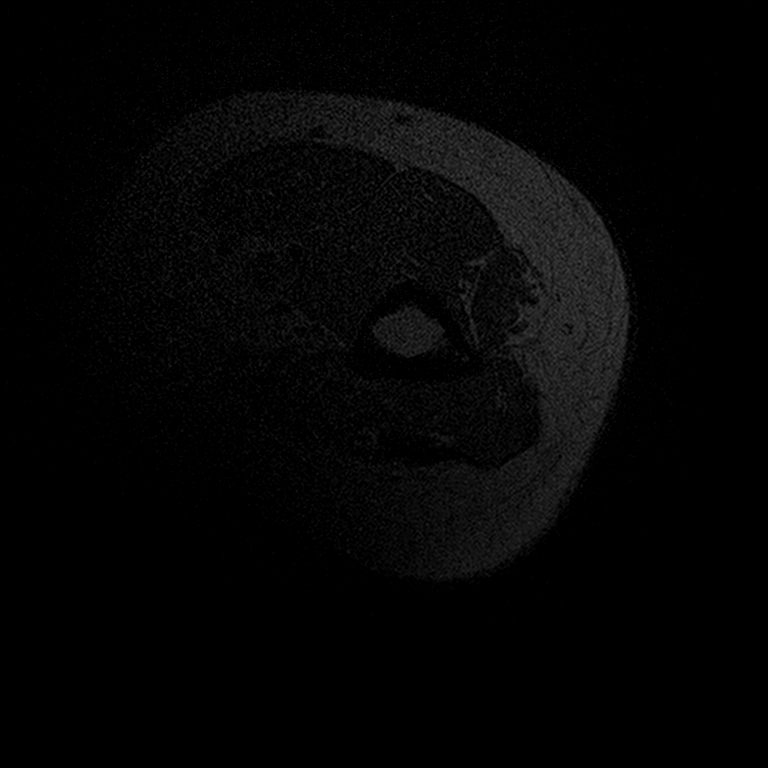

[Series 6: T2 fat-sat · coronal · left · 3.0mm · 0.22mm/px · 4 of 19 slices shown (1 of 3)]
[im 1/19]
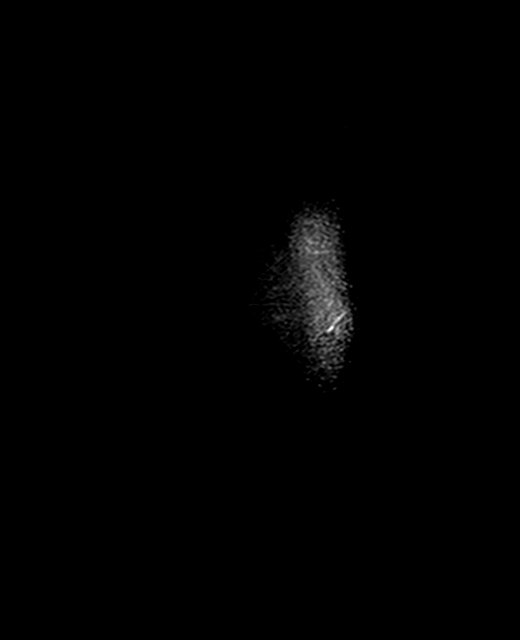
[im 4/19]
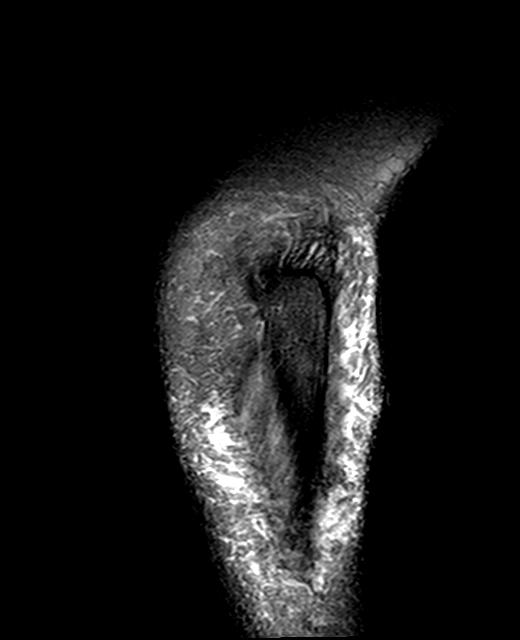
[im 10/19]
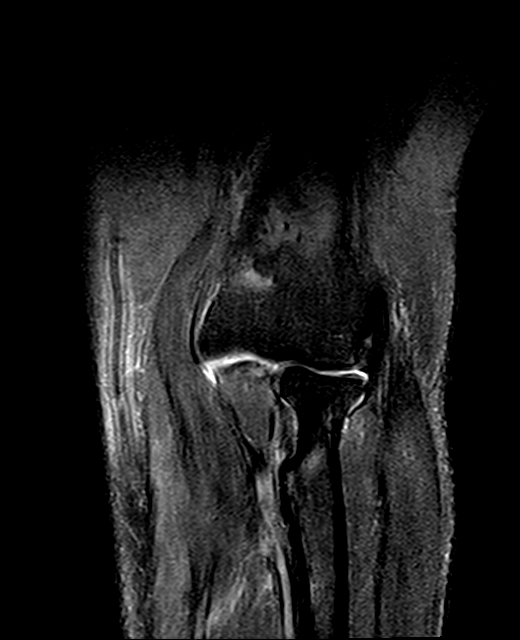
[im 16/19]
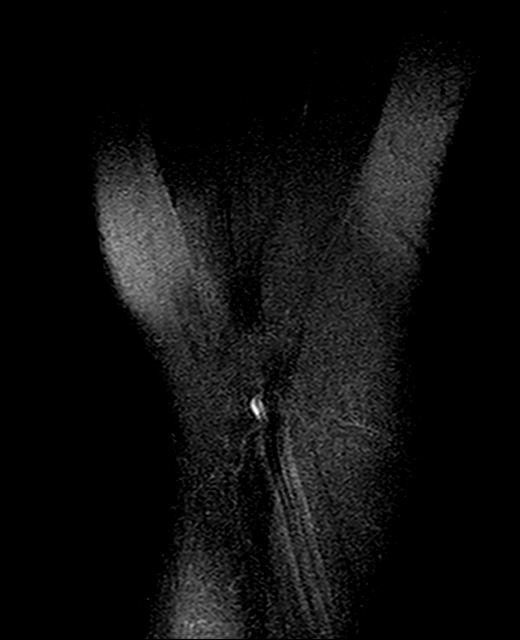

[Series 7: T2 fat-sat · axial · left · 3.0mm · 0.19mm/px · z∈[+29,+102]mm · 3 of 27 slices shown (2 of 3)]
[im 4/27]
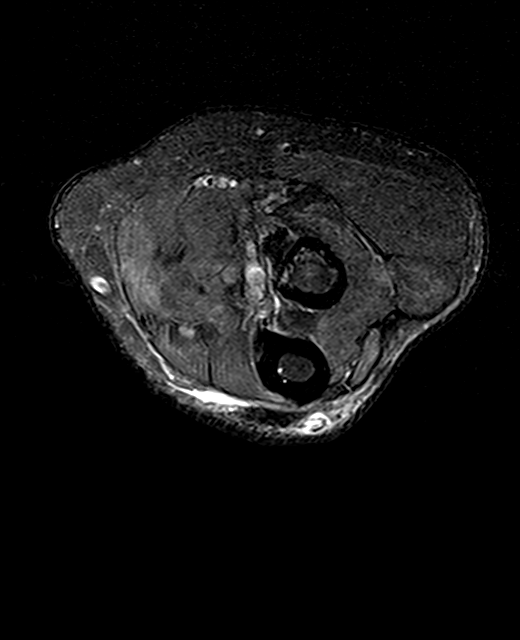
[im 14/27]
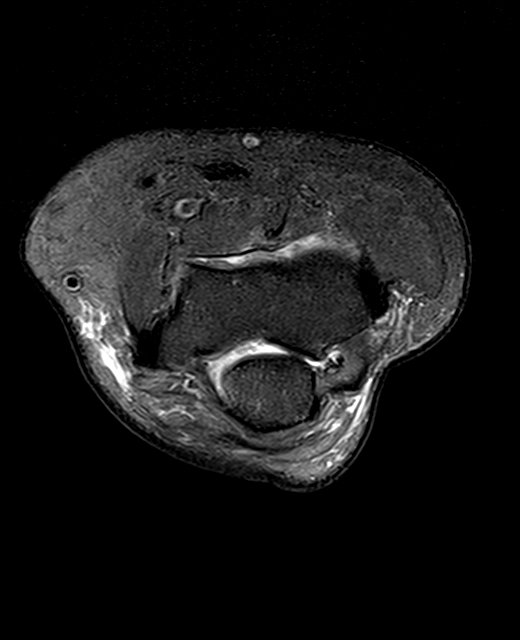
[im 23/27]
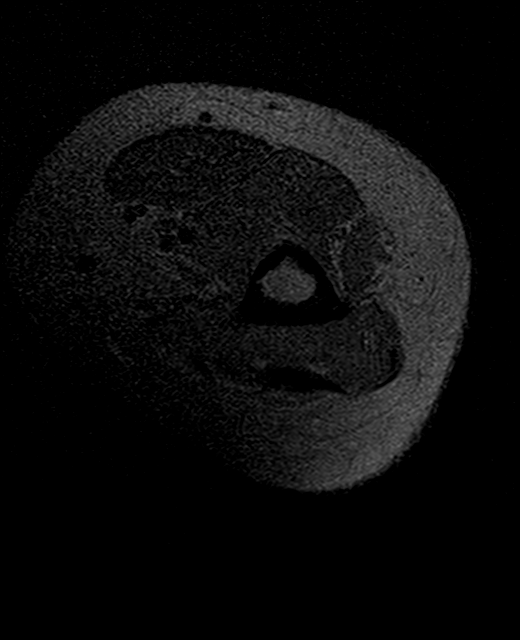

[Series 9: T2 fat-sat · sagittal · left · 3.0mm · 0.22mm/px · 3 of 23 slices shown (3 of 3)]
[im 4/23]
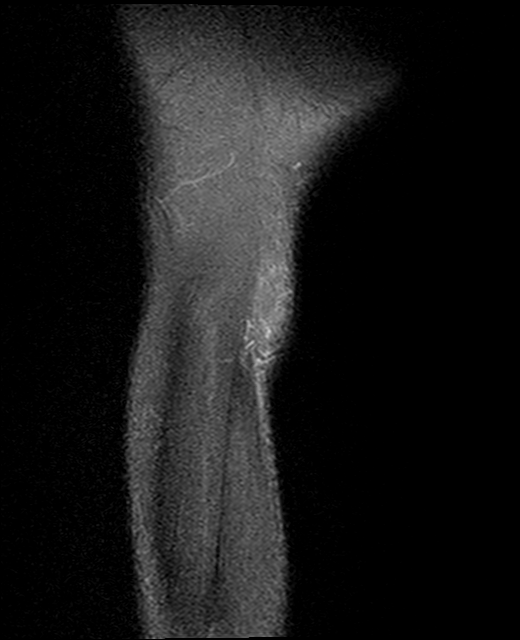
[im 13/23]
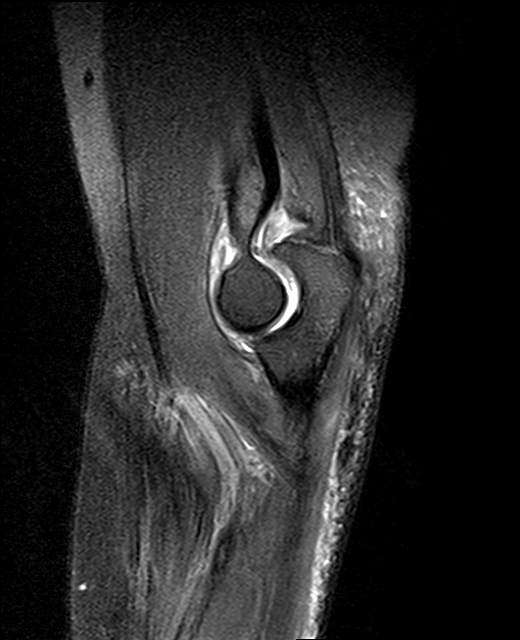
[im 19/23]
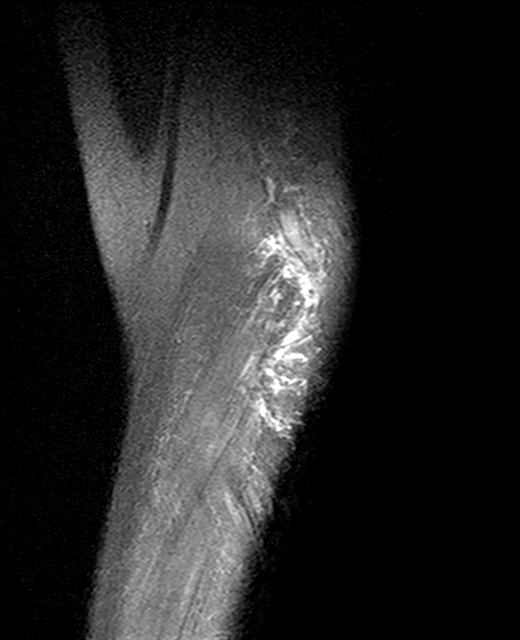

[13 of 40 positions shown; findings below may reference images not displayed]

FINDINGS: TENDONS

Common forearm flexor origin: Intact.  No tendinopathy.

Common forearm extensor origin: Intact.  No tendinopathy.

Biceps: Intact.  The brachialis tendon is also intact.

Triceps: Intact.

LIGAMENTS

Medial stabilizers: Intact

Lateral stabilizers:  Intact

Cartilage: Mild degenerative changes with cartilage thinning and
fraying. Early joint space narrowing. No significant spurring. No
obvious erosions.

Joint: Small joint effusion and mild synovitis noted.

Cubital tunnel: Normal appearance of the ulnar nerve.

Bones: No acute bony findings. The small questioned erosion
involving the olecranon is not identified for certain on this
examination. No osteochondral lesion.

Other: Mild edema like signal changes in the proximal flexor muscles
could suggest a muscle strain or myositis.
IMPRESSION: 1. Intact medial and lateral collateral ligaments and major elbow
tendons.
2. Mild elbow joint degenerative changes with cartilage thinning and
fraying.
3. Small joint effusion and mild synovitis.  No obvious erosions.
4. Mild edema like signal changes in the proximal flexor muscles
could suggest a muscle strain or myositis.

## 2022-12-29 ENCOUNTER — Telehealth: Payer: Self-pay | Admitting: Podiatry

## 2022-12-29 NOTE — Telephone Encounter (Signed)
Pt called and left message today at 1233pm stating she ordered shoes about a month ago and needed more information. She has never had to pay a copay before.  I returned call and explained we just got the paperwork today from Dr [facility] and the inserts will go into production soon and we will call when they come in, but it should not be too much longer.She said thank you for calling her back.

## 2023-02-09 ENCOUNTER — Ambulatory Visit: Payer: 59

## 2023-02-10 ENCOUNTER — Ambulatory Visit (INDEPENDENT_AMBULATORY_CARE_PROVIDER_SITE_OTHER): Payer: 59

## 2023-02-10 DIAGNOSIS — M2011 Hallux valgus (acquired), right foot: Secondary | ICD-10-CM | POA: Diagnosis not present

## 2023-02-10 DIAGNOSIS — L84 Corns and callosities: Secondary | ICD-10-CM

## 2023-02-10 DIAGNOSIS — M2041 Other hammer toe(s) (acquired), right foot: Secondary | ICD-10-CM

## 2023-02-10 DIAGNOSIS — M2042 Other hammer toe(s) (acquired), left foot: Secondary | ICD-10-CM

## 2023-02-10 DIAGNOSIS — E1151 Type 2 diabetes mellitus with diabetic peripheral angiopathy without gangrene: Secondary | ICD-10-CM | POA: Diagnosis not present

## 2023-02-10 NOTE — Progress Notes (Signed)

## 2023-02-19 ENCOUNTER — Emergency Department (HOSPITAL_COMMUNITY)
Admission: EM | Admit: 2023-02-19 | Discharge: 2023-02-20 | Disposition: A | Discharge status: Home / Self Care | Payer: 59 | Attending: Emergency Medicine | Admitting: Emergency Medicine

## 2023-02-19 DIAGNOSIS — Z7982 Long term (current) use of aspirin: Secondary | ICD-10-CM | POA: Insufficient documentation

## 2023-02-19 DIAGNOSIS — R55 Syncope and collapse: Secondary | ICD-10-CM | POA: Diagnosis present

## 2023-02-19 DIAGNOSIS — Z794 Long term (current) use of insulin: Secondary | ICD-10-CM | POA: Diagnosis not present

## 2023-02-19 LAB — CBC WITH DIFFERENTIAL/PLATELET
Abs Immature Granulocytes: 0.02 10*3/uL (ref 0.00–0.07)
Basophils Absolute: 0 10*3/uL (ref 0.0–0.1)
Basophils Relative: 0 %
Eosinophils Absolute: 0.1 10*3/uL (ref 0.0–0.5)
Eosinophils Relative: 1 %
HCT: 38 % (ref 36.0–46.0)
Hemoglobin: 12.1 g/dL (ref 12.0–15.0)
Immature Granulocytes: 0 %
Lymphocytes Relative: 16 %
Lymphs Abs: 1.5 10*3/uL (ref 0.7–4.0)
MCH: 26.3 pg (ref 26.0–34.0)
MCHC: 31.8 g/dL (ref 30.0–36.0)
MCV: 82.6 fL (ref 80.0–100.0)
Monocytes Absolute: 0.9 10*3/uL (ref 0.1–1.0)
Monocytes Relative: 10 %
Neutro Abs: 6.8 10*3/uL (ref 1.7–7.7)
Neutrophils Relative %: 73 %
Platelets: 192 10*3/uL (ref 150–400)
RBC: 4.6 MIL/uL (ref 3.87–5.11)
RDW: 15 % (ref 11.5–15.5)
WBC: 9.5 10*3/uL (ref 4.0–10.5)
nRBC: 0 % (ref 0.0–0.2)

## 2023-02-19 MED ORDER — SODIUM CHLORIDE 0.9 % IV BOLUS
1000.0000 mL | Freq: Once | INTRAVENOUS | Status: AC
  Administered 2023-02-19: 1000 mL via INTRAVENOUS

## 2023-02-19 NOTE — ED Notes (Signed)
Pt's friend Wannetta Sender would like an update when available. Phone number [phone]

## 2023-02-19 NOTE — ED Provider Notes (Signed)
[place] EMERGENCY DEPARTMENT AT [facility] Provider Note   CSN: [id number] Arrival date & time: 02/19/23  2314     History {Add pertinent medical, surgical, social history, OB history to HPI:1} Chief Complaint  Patient presents with   Loss of Consciousness    Amy [name] is a 62 y.o. female.  62 yo F with a chief complaints of an episode where she was passed out.  Says she been very dizzy when she stands up recently.  These for the past few days.  She was sitting at a table and she reached out for someone to give her medicines and then when she realized no one was there she went to stand up and felt a bit unwell.  EMS was called and she was hypotensive on scene.  Improved en route with a small bolus of IV fluids.  Patient feels a bit better now.  Tells me that usually when she stands up she has been feeling a bit dizzy.  She also has been having episodes where she feels confused.  Has been going on for at least the past couple weeks.  She denies any recent medication changes.  Has chronic back pain is unchanged.  Denies cough congestion or fever denies nausea vomiting or diarrhea.  Feels like she is been eating and drinking normally.   Loss of Consciousness      Home Medications Prior to Admission medications   Medication Sig Start Date End Date Taking? Authorizing Provider  albuterol (PROVENTIL HFA;VENTOLIN HFA) 108 (90 BASE) MCG/ACT inhaler Inhale 2 puffs into the lungs every 4 (four) hours as needed for wheezing or shortness of breath. For short    [provider]  allopurinol (ZYLOPRIM) 300 MG tablet Take 300 mg by mouth 2 (two) times daily. 11/23/20   [provider]  aspirin EC 81 MG tablet Take 81 mg by mouth daily.    [provider]  blood glucose meter kit and supplies Dispense based on patient and insurance preference. Use up to four times daily as directed. (FOR ICD-10 E10.9, E11.9). 08/06/20   [name], MD  cetirizine  (ZYRTEC) 10 MG tablet Take 10 mg by mouth daily.    [provider]  clotrimazole-betamethasone (LOTRISONE) cream Apply 1 application topically 2 (two) times daily. 07/08/21   McDonald, Rachelle Hora, DPM  colchicine 0.6 MG tablet Take by mouth. 12/14/20   [provider]  Continuous Blood Gluc Sensor (DEXCOM G7 SENSOR) MISC 1 Device by Does not apply route as directed. 10/19/21   Shamleffer, Konrad Dolores, MD  Dulaglutide (TRULICITY) 3 MG/0.5ML SOPN Inject 3 mg as directed once a week. 07/20/22   Shamleffer, Konrad Dolores, MD  EMBRACE TALK GLUCOSE TEST test strip USE TO CHECK BLOOD SUGARS FOUR TIMES A DAY BEFORE MEALS AND AT BEDTIME DX E11.29 07/29/20   [name], MD  esomeprazole (NEXIUM) 40 MG capsule Take 1 capsule (40 mg total) by mouth daily. 07/10/20   Bensimhon, Bevelyn Buckles, MD  FARXIGA 10 MG TABS tablet Take 10 mg by mouth daily. 06/02/22   [provider]  gabapentin (NEURONTIN) 400 MG capsule Take 400 mg by mouth 3 (three) times daily. 08/10/21   [provider]  hydrOXYzine (ATARAX/VISTARIL) 25 MG tablet Take 0.5 tablets (12.5 mg total) by mouth every 8 (eight) hours as needed for itching. 12/05/20   Wallis Bamberg, PA-C  insulin lispro (HUMALOG) 100 UNIT/ML KwikPen MAX DAILY 30 UNITS 01/19/22   Shamleffer, Konrad Dolores, MD  Insulin Pen Needle (PEN NEEDLES) 31G X 8 MM MISC Inject 1 Device into the skin in the morning, at noon, in the evening, and at bedtime. 01/19/22   Shamleffer, Konrad Dolores, MD  levothyroxine (SYNTHROID) 150 MCG tablet Take 1 tablet (150 mcg total) by mouth daily. Needs appt for future refills 08/06/20   [name], MD  linaclotide [facility]) 145 MCG CAPS capsule Take 145 mcg by mouth daily as needed (constipation).    [provider]  naproxen (NAPROSYN) 500 MG tablet Take 500 mg by mouth 2 (two) times daily with a meal.    [provider]  nitrofurantoin, macrocrystal-monohydrate, (MACROBID) 100 MG capsule Take 1 capsule  (100 mg total) by mouth 2 (two) times daily. 11/06/21   Ellsworth Lennox, PA-C  olmesartan (BENICAR) 20 MG tablet Take 10 mg by mouth daily. 04/26/21   [provider]  ondansetron (ZOFRAN ODT) 4 MG disintegrating tablet Take 1 tablet (4 mg total) by mouth every 8 (eight) hours as needed for nausea or vomiting. Patient not taking: Reported on 11/22/2022 07/09/19   [name], PA-C  potassium chloride SA (KLOR-CON) 20 MEQ tablet Take 1 tablet (20 mEq total) by mouth daily. Need appt for future refills 08/06/20   [name], MD  Prodigy Twist Top Lancets 28G MISC USE TO CHECK BLOOD SUGARS 10/02/20   [provider]  simvastatin (ZOCOR) 20 MG tablet Take 1 tablet (20 mg total) by mouth every evening. 08/06/20   [name], MD  spironolactone (ALDACTONE) 25 MG tablet Take 0.5 tablets (12.5 mg total) by mouth daily. Needs appt for future refills 08/06/20   [name], MD  tiZANidine (ZANAFLEX) 2 MG tablet Take 2 mg by mouth 2 (two) times daily. 10/02/20   [provider]  torsemide (DEMADEX) 20 MG tablet Take 4 tablets (80 mg total) by mouth daily. Need appt for future refills 08/06/20   [name], MD  TRESIBA FLEXTOUCH 200 UNIT/ML FlexTouch Pen Inject 12 Units into the skin daily at 6 (six) AM. Patient taking differently: Inject 10 Units into the skin daily at 6 (six) AM. 03/29/22   Shamleffer, Konrad Dolores, MD      Allergies    Penicillins, Tramadol, Vicodin [hydrocodone-acetaminophen], Codeine, Iodine-131, Iohexol, Lisinopril, and Sulfa antibiotics    Review of Systems   Review of Systems  Cardiovascular:  Positive for syncope.    Physical Exam Updated Vital Signs BP 123/65 (BP Location: Left Arm)   Pulse 69   Temp 97.6 F (36.4 C) (Oral)   Resp 16   SpO2 98%  Physical Exam Vitals and nursing note reviewed.  Constitutional:      General: She is not in acute distress.    Appearance: She is well-developed. She is not diaphoretic.  HENT:      Head: Normocephalic and atraumatic.  Cardiovascular:     Rate and Rhythm: Normal rate and regular rhythm.     Heart sounds: No murmur heard.    No friction rub. No gallop.  Pulmonary:     Effort: Pulmonary effort is normal.     Breath sounds: No wheezing or rales.  Abdominal:     General: There is no distension.     Palpations: Abdomen is soft.     Tenderness: There is no abdominal tenderness.  Musculoskeletal:        General: No tenderness.     Cervical back: Normal range of motion and neck supple.  Skin:    General:  Skin is warm and dry.  Neurological:     Mental Status: She is alert and oriented to person, place, and time.  Psychiatric:        Behavior: Behavior normal.     ED Results / Procedures / Treatments   Labs (all labs ordered are listed, but only abnormal results are displayed) Labs Reviewed  CBC WITH DIFFERENTIAL/PLATELET  COMPREHENSIVE METABOLIC PANEL    EKG None  Radiology No results found.  Procedures Procedures  {Document cardiac monitor, telemetry assessment procedure when appropriate:1}  Medications Ordered in ED Medications  sodium chloride 0.9 % bolus 1,000 mL (has no administration in time range)    ED Course/ Medical Decision Making/ A&P   {   Click here for ABCD2, HEART and other calculatorsREFRESH Note before signing :1}                              Medical Decision Making Amount and/or Complexity of Data Reviewed Labs: ordered. Radiology: ordered.   62 yo F  with a chief complaints of near syncope.  Since then she has been having lightheadedness upon standing for at least the past couple days.  Will obtain a laboratory evaluation bolus of IV fluids.  Reassess.  Patient was also complaining of some transient confusion.  Patient is legally blind and requires others to help her at home.  She been having episodes where she forgets where she is.  {Document critical care time when appropriate:1} {Document review of labs and clinical  decision tools ie heart score, Chads2Vasc2 etc:1}  {Document your independent review of radiology images, and any outside records:1} {Document your discussion with family members, caretakers, and with consultants:1} {Document social determinants of health affecting pt's care:1} {Document your decision making why or why not admission, treatments were needed:1} Final Clinical Impression(s) / ED Diagnoses Final diagnoses:  None    Rx / DC Orders ED Discharge Orders     None

## 2023-02-19 NOTE — ED Triage Notes (Signed)
Patient was sitting at kitchen table, she became dizzy, upon standing and fell, she denies LOC or hitting her head. Experiencing dizziness X 1 week and episodes of confusion X 1 week

## 2023-02-20 ENCOUNTER — Emergency Department (HOSPITAL_COMMUNITY): Payer: 59

## 2023-02-20 DIAGNOSIS — R55 Syncope and collapse: Secondary | ICD-10-CM | POA: Diagnosis not present

## 2023-02-20 LAB — COMPREHENSIVE METABOLIC PANEL
ALT: 10 U/L (ref 0–44)
AST: 13 U/L — ABNORMAL LOW (ref 15–41)
Albumin: 3.2 g/dL — ABNORMAL LOW (ref 3.5–5.0)
Alkaline Phosphatase: 85 U/L (ref 38–126)
Anion gap: 13 (ref 5–15)
BUN: 28 mg/dL — ABNORMAL HIGH (ref 8–23)
CO2: 26 mmol/L (ref 22–32)
Calcium: 9.4 mg/dL (ref 8.9–10.3)
Chloride: 105 mmol/L (ref 98–111)
Creatinine, Ser: 2.13 mg/dL — ABNORMAL HIGH (ref 0.44–1.00)
GFR, Estimated: 26 mL/min — ABNORMAL LOW (ref >60)
Glucose, Bld: 248 mg/dL — ABNORMAL HIGH (ref 70–99)
Potassium: 4.3 mmol/L (ref 3.5–5.1)
Sodium: 144 mmol/L (ref 135–145)
Total Bilirubin: 0.2 mg/dL — ABNORMAL LOW (ref 0.3–1.2)
Total Protein: 6.5 g/dL (ref 6.5–8.1)

## 2023-02-20 NOTE — Discharge Instructions (Signed)
Eat and drink as well as you can for the next couple days.  Follow up with your doctor in the office.  Your kidney function was mildly worse than it has been previously see if your doctor can recheck it for you.   Please return for worsening symptoms.

## 2023-02-20 NOTE — ED Notes (Signed)
Patient to CT.

## 2023-02-21 ENCOUNTER — Ambulatory Visit (INDEPENDENT_AMBULATORY_CARE_PROVIDER_SITE_OTHER): Payer: 59 | Admitting: Podiatry

## 2023-02-21 DIAGNOSIS — Z91199 Patient's noncompliance with other medical treatment and regimen due to unspecified reason: Secondary | ICD-10-CM

## 2023-02-21 NOTE — Progress Notes (Signed)
1. No-show for appointment   Recent ED visit. 

## 2023-03-07 ENCOUNTER — Ambulatory Visit: Payer: 59 | Admitting: Podiatry

## 2023-03-07 ENCOUNTER — Encounter: Payer: Self-pay | Admitting: Podiatry

## 2023-03-07 VITALS — Ht 59.0 in | Wt 170.0 lb

## 2023-03-07 DIAGNOSIS — M79676 Pain in unspecified toe(s): Secondary | ICD-10-CM

## 2023-03-07 DIAGNOSIS — Z794 Long term (current) use of insulin: Secondary | ICD-10-CM

## 2023-03-07 DIAGNOSIS — L84 Corns and callosities: Secondary | ICD-10-CM

## 2023-03-07 DIAGNOSIS — B351 Tinea unguium: Secondary | ICD-10-CM

## 2023-03-07 DIAGNOSIS — E1140 Type 2 diabetes mellitus with diabetic neuropathy, unspecified: Secondary | ICD-10-CM

## 2023-03-07 DIAGNOSIS — R069 Unspecified abnormalities of breathing: Secondary | ICD-10-CM | POA: Insufficient documentation

## 2023-03-07 NOTE — Progress Notes (Signed)
Subjective:  Patient ID: Amy [name], female    DOB: [birth year],  MRN: [id number]  Amy [name] presents to clinic today for at risk foot care with history of diabetic neuropathy and callus(es) right foot and painful thick toenails that are difficult to trim. Painful toenails interfere with ambulation. Aggravating factors include wearing enclosed shoe gear. Pain is relieved with periodic professional debridement. Painful calluses are aggravated when weightbearing with and without shoegear. Pain is relieved with periodic professional debridement.   Patient states she has not gotten her pediatric heel protectors yet.  She is accompanied by her caregiver, Amy [name], on today's visit. Chief Complaint  Patient presents with   Diabetes    Pt is here for dfc, Last A1C was a 7, Last office visit was last week. PCP is Dr Allyson Sabal   New problem(s): None.   PCP is [facility].  Allergies  Allergen Reactions   Penicillins Hives and Swelling    Has patient had a PCN reaction causing immediate rash, facial/tongue/throat swelling, SOB or lightheadedness with hypotension: yes- face swelling Has patient had a PCN reaction causing severe rash involving mucus membranes or skin necrosis: no Has patient had a PCN reaction that required hospitalization unknown (childhood allergy) Has patient had a PCN reaction occurring within the last 10 years: no If all of the above answers are "NO", then may proceed with Cephalosporin use.    Tramadol Nausea And Vomiting    Intense nausea   Vicodin [Hydrocodone-Acetaminophen] Nausea And Vomiting   Codeine Nausea Only    Other reaction(s): Unknown   Iodine-131 Hives   Iohexol Hives    Pt developed itching and hives along with nasal congestion; needs 13 hour premeds for future studies, Onset Date: [id number]    Lisinopril Cough    Other reaction(s): Unknown   Sulfa Antibiotics Nausea And Vomiting    Review of Systems: Negative except as noted  in the HPI.  Objective: No changes noted in today's physical examination. There were no vitals filed for this visit. Amy [name] is a pleasant 62 y.o. female in NAD. AAO x 3.  Vascular Examination: CFT <3 seconds b/l. DPpulses faintly palpable b/l. PT pulses nonpalpable b/l. Skin temperature gradient warm to warm b/l. No pain with calf compression. No ischemia or gangrene. No cyanosis or clubbing noted b/l. Trace edema noted BLE.   Neurological Examination: Sensation grossly intact b/l with 10 gram monofilament. Vibratory sensation intact b/l. Pt has subjective symptoms of neuropathy.  Dermatological Examination: Pedal skin warm and supple b/l. Toenails 1-5 b/l thick, discolored, elongated with subungual debris and pain on dorsal palpation.  No open wounds b/l LE. No interdigital macerations noted b/l LE.   Hyperkeratotic lesion(s) right heel.  No erythema, no edema, no drainage, no fluctuance.  Musculoskeletal Examination: Muscle strength 5/5 to b/l LE. HAV with bunion deformity noted b/l LE. Hammertoe deformity noted 2-5 b/l. Utilizes rollator for ambulation assistance.  Radiographs: None  Assessment/Plan: 1. Pain due to onychomycosis of toenail   2. Callus of foot   3. Type 2 diabetes mellitus with diabetic neuropathy, with long-term current use of insulin (HCC)     -Consent given for treatment as described below: -Examined patient. -Patient to continue soft, supportive shoe gear daily. -Toenails 1-5 b/l were debrided in length and girth with sterile nail nippers without iatrogenic bleeding.  -Callus(es) right heel pared utilizing sterile scalpel blade without complication or incident. Total number debrided =1. -Patient/POA to call should there be question/concern in the  interim.   Return in about 3 months (around 06/07/2023).  Freddie Breech, DPM

## 2023-03-15 ENCOUNTER — Other Ambulatory Visit: Payer: Self-pay | Admitting: Gastroenterology

## 2023-03-23 ENCOUNTER — Encounter: Payer: Self-pay | Admitting: Internal Medicine

## 2023-03-23 ENCOUNTER — Ambulatory Visit: Payer: 59 | Admitting: Internal Medicine

## 2023-03-23 VITALS — BP 122/80 | HR 72 | Ht 59.0 in | Wt 175.8 lb

## 2023-03-23 DIAGNOSIS — E1165 Type 2 diabetes mellitus with hyperglycemia: Secondary | ICD-10-CM

## 2023-03-23 DIAGNOSIS — Z794 Long term (current) use of insulin: Secondary | ICD-10-CM

## 2023-03-23 LAB — POCT GLYCOSYLATED HEMOGLOBIN (HGB A1C): Hemoglobin A1C: 7.7 % — AB (ref 4.0–5.6)

## 2023-03-23 MED ORDER — DAPAGLIFLOZIN PROPANEDIOL 10 MG PO TABS: 10.0000 mg | ORAL_TABLET | Freq: Every day | ORAL | 3 refills | Status: DC

## 2023-03-23 MED ORDER — TRESIBA FLEXTOUCH 200 UNIT/ML ~~LOC~~ SOPN: 8.0000 [IU] | PEN_INJECTOR | Freq: Every day | SUBCUTANEOUS | 2 refills | Status: AC

## 2023-03-23 MED ORDER — INSULIN LISPRO (1 UNIT DIAL) 100 UNIT/ML (KWIKPEN): PEN_INJECTOR | SUBCUTANEOUS | 3 refills | Status: DC

## 2023-03-23 MED ORDER — INSULIN PEN NEEDLE 32G X 4 MM MISC: 1.0000 | Freq: Four times a day (QID) | 3 refills | Status: DC

## 2023-03-23 MED ORDER — TRULICITY 3 MG/0.5ML ~~LOC~~ SOAJ: 3.0000 mg | SUBCUTANEOUS | 3 refills | Status: DC

## 2023-03-23 NOTE — Progress Notes (Signed)
Name: Amy [name]  Age/ Sex: 62 y.o., female   MRN/ DOB: [id number], Jan 05, 1961     PCP: [facility]   Reason for Endocrinology Evaluation: Type 2 Diabetes Mellitus  Initial Endocrine Consultative Visit: 06/29/2020    PATIENT IDENTIFIER: Amy [name] is a 62 y.o. female with a past medical history of T2DM, Hypothyroidism and Dyslipidemia. The patient has followed with Endocrinology clinic since 06/29/2020 for consultative assistance with management of her diabetes.  DIABETIC HISTORY:  Amy [name] was diagnosed with T2DM in her 49's. Her hemoglobin A1c has ranged from 9.9% in 2021, peaking at 12.3% in 2014.   She was started on Farxiga by a provider, patient does not recall who started her on it  SUBJECTIVE:      Today (03/23/2023): Amy [name] is here for a follow up on diabetes management. She is accompanied by her assistant. She checks glucose multiple times daily through dexcom. She has been noted with hypoglycemia. She is symptomatic    Pt follows with nephrology Amy [name]  She had a follow-up with podiatry 05/07/2023  Denies nausea or vomiting  Recovering from PNA  NO constipation or diarrhea   HOME DIABETES REGIMEN:  Tresiba 10 units daily Humalog 6 units TIDQAC  Trulicity 3 mg weekly ( Friday )  Farxiga 10 mg daily    Statin: yes ACE-I/ARB: no  CONTINUOUS GLUCOSE MONITORING RECORD INTERPRETATION    Dates of Recording: 10/25-11/11/2022  Sensor description:dexcom  Results statistics:   CGM use % of time 71  Average and SD 170/59  Time in range    59    %  % Time Above 180 29  % Time above 250 11  % Time Below target 1   Glycemic patterns summary: BGs are optimal overnight, and fluctuate during the day  Hyperglycemic episodes postprandial  Hypoglycemic episodes occurred during the night and day  Overnight periods: Trends down    DIABETIC COMPLICATIONS: Microvascular complications:   Denies: CKD III, neuropathy.  Left  prosthetic eye. Blind right eye  Last Eye Exam: Completed 04/2020 ( Amy [name])    Macrovascular complications:   Denies: CAD, CVA, PVD   HISTORY:  Past Medical History:  Past Medical History:  Diagnosis Date   Arthritis    Asthma    Blind    Blindness and low vision    left eye glass eye,  legally blind in right eye   Diabetes mellitus    Diabetic neuropathy (HCC)    Hyperlipidemia    Hypertension    Hypothyroidism    Intractable nausea and vomiting 03/06/2018   Spinal stenosis    Past Surgical History:  Past Surgical History:  Procedure Laterality Date   ABDOMINAL HYSTERECTOMY  1990   BREAST BIOPSY Left    BREAST BIOPSY Right    BREAST BIOPSY Right 08/19/2022   [place] RT BREAST BX W LOC DEV 1ST LESION IMG BX SPEC US GUIDE 08/19/2022 [facility]   CESAREAN SECTION  1987, 1990   x 2   CHOLECYSTECTOMY  2008   ENUCLEATION Bilateral 09/15/1998   EYE SURGERY  2016   fitting artificial eye   Social History:  reports that she quit smoking about 30 years ago. Her smoking use included cigarettes. She has never used smokeless tobacco. She reports that she does not drink alcohol and does not use drugs. Family History:  Family History  Problem Relation Age of Onset   Diabetes Sister    Glaucoma Sister  Hypertension Sister    Healthy Mother    Healthy Father    Stroke Brother    Heart attack Paternal Aunt    Breast cancer Neg Hx      HOME MEDICATIONS: Allergies as of 03/23/2023       Reactions   Penicillins Hives, Swelling   Has patient had a PCN reaction causing immediate rash, facial/tongue/throat swelling, SOB or lightheadedness with hypotension: yes- face swelling Has patient had a PCN reaction causing severe rash involving mucus membranes or skin necrosis: no Has patient had a PCN reaction that required hospitalization unknown (childhood allergy) Has patient had a PCN reaction occurring within the last 10 years: no If all of the above answers are "NO", then  may proceed with Cephalosporin use.   Tramadol Nausea And Vomiting   Intense nausea   Vicodin [hydrocodone-acetaminophen] Nausea And Vomiting   Codeine Nausea Only   Other reaction(s): Unknown   Iodine-131 Hives   Iohexol Hives   Pt developed itching and hives along with nasal congestion; needs 13 hour premeds for future studies, Onset Date: [id number]   Lisinopril Cough   Other reaction(s): Unknown   Sulfa Antibiotics Nausea And Vomiting        Medication List        Accurate as of March 23, 2023 10:59 AM. If you have any questions, ask your nurse or doctor.          albuterol 108 (90 Base) MCG/ACT inhaler Commonly known as: VENTOLIN HFA Inhale 2 puffs into the lungs every 4 (four) hours as needed for wheezing or shortness of breath. For short   allopurinol 300 MG tablet Commonly known as: ZYLOPRIM Take 300 mg by mouth 2 (two) times daily.   aspirin EC 81 MG tablet Take 81 mg by mouth daily.   blood glucose meter kit and supplies Dispense based on patient and insurance preference. Use up to four times daily as directed. (FOR ICD-10 E10.9, E11.9).   celecoxib 200 MG capsule Commonly known as: CELEBREX celecoxib 200 mg capsule TK 1 C PO BID   cetirizine 10 MG tablet Commonly known as: ZYRTEC Take 10 mg by mouth daily.   clotrimazole-betamethasone cream Commonly known as: Lotrisone Apply 1 application topically 2 (two) times daily.   colchicine 0.6 MG tablet Take by mouth.   Dexcom G7 Sensor Misc 1 Device by Does not apply route as directed.   docusate sodium 100 MG capsule Commonly known as: COLACE Take 100 mg by mouth 2 (two) times daily.   Embrace Talk Glucose Test test strip Generic drug: glucose blood USE TO CHECK BLOOD SUGARS FOUR TIMES A DAY BEFORE MEALS AND AT BEDTIME DX E11.29   esomeprazole 40 MG capsule Commonly known as: NEXIUM Take 1 capsule (40 mg total) by mouth daily.   Farxiga 10 MG Tabs tablet Generic drug: dapagliflozin  propanediol Take 10 mg by mouth daily.   fluticasone 50 MCG/ACT nasal spray Commonly known as: FLONASE Place 2 sprays into both nostrils daily.   gabapentin 400 MG capsule Commonly known as: NEURONTIN Take 400 mg by mouth 3 (three) times daily.   hydrOXYzine 25 MG tablet Commonly known as: ATARAX Take 0.5 tablets (12.5 mg total) by mouth every 8 (eight) hours as needed for itching.   insulin lispro 100 UNIT/ML KwikPen Commonly known as: HUMALOG MAX DAILY 30 UNITS   levofloxacin 750 MG tablet Commonly known as: LEVAQUIN Take 750 mg by mouth daily.   levothyroxine 150 MCG tablet Commonly known as: SYNTHROID  Take 1 tablet (150 mcg total) by mouth daily. Needs appt for future refills   linaclotide 145 MCG Caps capsule Commonly known as: LINZESS Take 145 mcg by mouth daily as needed (constipation).   losartan 25 MG tablet Commonly known as: COZAAR Take 25 mg by mouth daily.   naproxen 500 MG tablet Commonly known as: NAPROSYN Take 500 mg by mouth 2 (two) times daily with a meal.   nitrofurantoin (macrocrystal-monohydrate) 100 MG capsule Commonly known as: MACROBID Take 1 capsule (100 mg total) by mouth 2 (two) times daily.   olmesartan 20 MG tablet Commonly known as: BENICAR Take 10 mg by mouth daily.   olmesartan 40 MG tablet Commonly known as: BENICAR Take 40 mg by mouth daily.   ondansetron 4 MG disintegrating tablet Commonly known as: Zofran ODT Take 1 tablet (4 mg total) by mouth every 8 (eight) hours as needed for nausea or vomiting.   Pen Needles 31G X 8 MM Misc Inject 1 Device into the skin in the morning, at noon, in the evening, and at bedtime.   potassium chloride SA 20 MEQ tablet Commonly known as: KLOR-CON M Take 1 tablet (20 mEq total) by mouth daily. Need appt for future refills   Prodigy Twist Top Lancets 28G Misc USE TO CHECK BLOOD SUGARS   simvastatin 20 MG tablet Commonly known as: ZOCOR Take 1 tablet (20 mg total) by mouth every  evening.   spironolactone 25 MG tablet Commonly known as: ALDACTONE Take 0.5 tablets (12.5 mg total) by mouth daily. Needs appt for future refills   tiZANidine 2 MG tablet Commonly known as: ZANAFLEX Take 2 mg by mouth 2 (two) times daily.   torsemide 20 MG tablet Commonly known as: DEMADEX Take 4 tablets (80 mg total) by mouth daily. Need appt for future refills   Tresiba FlexTouch 200 UNIT/ML FlexTouch Pen Generic drug: insulin degludec Inject 12 Units into the skin daily at 6 (six) AM. What changed: how much to take   Trulicity 3 MG/0.5ML Soaj Generic drug: Dulaglutide Inject 3 mg as directed once a week.         OBJECTIVE:   Vital Signs: BP 122/80 (BP Location: Right Arm, Patient Position: Sitting, Cuff Size: Small)   Pulse 72   Ht 4\' 11"  (1.499 m)   Wt 175 lb 12.8 oz (79.7 kg)   SpO2 97%   BMI 35.51 kg/m   Wt Readings from Last 3 Encounters:  03/23/23 175 lb 12.8 oz (79.7 kg)  03/07/23 170 lb (77.1 kg)  02/19/23 170 lb (77.1 kg)     Exam: General: Pt appears well and is in NAD  Lungs: Clear with good BS bilat   Heart: RRR   Extremities: No pretibial edema.   Neuro: MS is good with appropriate affect, pt is alert and Ox3       DM Foot Exam: 03/07/2023  per podiatry       DATA REVIEWED:  Lab Results  Component Value Date   HGBA1C 8.7 (A) 07/20/2022   HGBA1C 7.4 (A) 01/19/2022   HGBA1C 6.6 (A) 07/21/2021    Latest Reference Range & Units 02/19/23 23:42  Sodium 135 - 145 mmol/L 144  Potassium 3.5 - 5.1 mmol/L 4.3  Chloride 98 - 111 mmol/L 105  CO2 22 - 32 mmol/L 26  Glucose 70 - 99 mg/dL 102 (H)  BUN 8 - 23 mg/dL 28 (H)  Creatinine 7.25 - 1.00 mg/dL 3.66 (H)  Calcium 8.9 - 10.3 mg/dL 9.4  Anion  gap 5 - 15  13  Alkaline Phosphatase 38 - 126 U/L 85  Albumin 3.5 - 5.0 g/dL 3.2 (L)  AST 15 - 41 U/L 13 (L)  ALT 0 - 44 U/L 10  Total Protein 6.5 - 8.1 g/dL 6.5  Total Bilirubin 0.3 - 1.2 mg/dL 0.2 (L)  GFR, Estimated >60 mL/min 26 (L)   (H): Data is abnormally high (L): Data is abnormally low   ASSESSMENT / PLAN / RECOMMENDATIONS:   1) Type 2 Diabetes Mellitus,Sub- optimally controlled, With neuropathic, retinopathic and CKD III complications - Most recent A1c of 7.7 %. Goal A1c < 7.0 %.     -A1c is trending down -Patient with history of severe hypoglycemia, makes it difficult to optimize glucose - Main barrier to diabetes self care is visual impairment -I will decrease her basal rate due to hypoglycemia -No changes to Humalog dose at this time    MEDICATIONS: -Continue Trulicity 3 mg weekly -Decrease Tresiba 8 units daily  -Continue  Humalog 6 units before every meal 3 times daily -Continue Farxiga 10 mg daily -CF: Humalog (BG-130/50) TIDQAC  EDUCATION / INSTRUCTIONS: BG monitoring instructions: Patient is instructed to check her blood sugars 3 times a day, before meals. Call [facility] if: BG persistently < 70  I reviewed the Rule of 15 for the treatment of hypoglycemia in detail with the patient. Literature supplied.    2) Diabetic complications:  Eye: Does  have known diabetic retinopathy.  Neuro/ Feet: Does  have known diabetic peripheral neuropathy .  Renal: Patient does  have known baseline CKD. She   is not on an ACEI/ARB at present.      F/U in 6 months  I spent 35 minutes preparing to see the patient by review of recent labs, imaging and procedures, obtaining and reviewing separately obtained history, communicating with the patient/family or caregiver, ordering medications, tests or procedures, and documenting clinical information in the EHR including the differential Dx, treatment, and any further evaluation and other management    Signed electronically by: Lyndle Herrlich, MD  [facility] Endocrinology  [facility] [address] [place] [place], [place] [address] Phone: [phone] FAX: [phone]   CC: [facility] [address] [place] [address] Phone: [phone]  Fax: [phone]  Return to Endocrinology clinic as below: Future Appointments  Date Time Provider Department Center  03/23/2023 11:10 AM Akia Desroches, Konrad Dolores, MD [facility] None  04/19/2023 10:00 AM Shawnie Dapper, NP [facility] None  05/23/2023 10:15 AM McCaughan, Dia D, DPM [facility]  06/14/2023 10:00 AM Freddie Breech, DPM [facility]

## 2023-03-23 NOTE — Patient Instructions (Addendum)
-   Decrease  Tresiba  8 units daily - Continue Trulicity 3  mg weekly  - Continue  Humalog  6 units with Breakfast, 6 units with Lunch and 6 units with Supper  - Continue Farxiga 10 mg , 1 tablet daily  -Humalog correctional insulin: ADD extra units on insulin to your meal-time HumALOG dose if your blood sugars are higher than 180. Use the scale below to help guide you:   Blood sugar before meal Number of units to inject  Less than 180 0 unit  181 -  230 1 units  231 -  280 2 units  281 -  330 3 units  331 -  380 4 units  381 -  430 5 units  431 -  480 6 units          HOW TO TREAT LOW BLOOD SUGARS (Blood sugar LESS THAN 70 MG/DL) Please follow the RULE OF 15 for the treatment of hypoglycemia treatment (when your (blood sugars are less than 70 mg/dL)   STEP 1: Take 15 grams of carbohydrates when your blood sugar is low, which includes:  3-4 GLUCOSE TABS  OR 3-4 OZ OF JUICE OR REGULAR SODA OR ONE TUBE OF GLUCOSE GEL    STEP 2: RECHECK blood sugar in 15 MINUTES STEP 3: If your blood sugar is still low at the 15 minute recheck --> then, go back to STEP 1 and treat AGAIN with another 15 grams of carbohydrates

## 2023-04-18 NOTE — Progress Notes (Unsigned)
[name] DOB: [birth year]  REASON FOR VISIT: follow up HISTORY FROM: patient  No chief complaint on file.    HISTORY OF PRESENT ILLNESS:  04/18/23 ALL: Amy [name] returns for follow up for OSA on CPAP.   04/20/2022 SA:  I reviewed her CPAP compliance data from 03/20/2022 through 04/18/2022, which is a total of 30 days, during which time she used her machine 29 days with percent use days greater than 4 hours at 80%, indicating very good compliance with an average usage of 5 hours and 46 minutes, residual AHI borderline elevated at 6.7/h, leak on the low side with the 95th percentile at 4.6 L/min on a pressure of 10 cm with EPR of 3. She reports doing fairly well with her new machine. She likes her new machine but the water chamber is smaller and sometimes in the middle of the night she runs out of water. She does have some mouth dryness, uses a fullface mask and is noticing improvement in her daytime somnolence. She does not sleep all that well and has sleep disruption. She is motivated to continue with treatment at this time.   11/09/2021 ALL:  Amy [name] returns for follow up for OSA on CPAP. She was last seen 09/2019 and doing well. She called 12/2020 reporting difficulty with leaks. She was encouraged to work on mask seal and let us know if she continued to have difficulty. She reports that she has not been able to use it consistently. She has not received updated supplies from DME. She stopped therapy sometime around 07/2021. She reports that she received a call 2 weeks ago but she has not received any supplies. She reports losing about 70lbs over the past year. She is no longer eating red meats due to gout. Split night study 10/02/2016 showed "overall mild obstructive sleep apnea, moderate in REM sleep with a total AHI of 6.8/hour, REM AHI of 28/hour, and O2 nadir of 78%."   10/09/2019 ALL: Amy [name] is a 62 y.o. female here today for follow up of OSA on CPAP. She is doing well. She  has had some concerns of congestion and allergies over the past week. She feels that CPAP is hard to use when this happens. She feels that pressure is too strong for her. She usually tolerates CPAP well and denies any persistent concerns.   Compliance report dated 09/08/2019 through 10/07/2019 reveals that she used CPAP 29 of the past 30 days for compliance of 97%.  She used CPAP greater than 4 hours 23 of the past 30 days for compliance of 77%.  Average usage was 5 hours and 46 minutes.  Residual AHI was 4.1 on 8 cm of water and an EPR of 3.  There was no significant leak noted.  HISTORY: (copied from [employer] note on 09/01/2017)  Ms. Bracker is a 62 year old right-handed woman with an underlying medical history of diabetes, blindness (with L prosthetic eye and R eye blindness d/t diabetes), hyperlipidemia, asthma, arthritis, spinal stenosis, thyroid disease, nephropathy, orthostatic hypotension deemed secondary to diabetic autonomic neuropathy (seen by Dr. Marjory Lies in 9/17), and morbid obesity, who presents for follow up consultation of her sleep apnea, after recent sleep study testing. The patient is unaccompanied today. I first met her on 09/22/16, at the request of her PCP, at which time she reported snoring and excessive daytime somnolence. I invited her for a sleep study. She had a baseline sleep study, followed by a CPAP titration study. I went over  her test results with her in detail today. Baseline sleep study from 10/02/2016 showed a sleep efficiency of 84.8%, sleep latency of 18.5 minutes, REM latency of 72.5 minutes, she had absence of slow-wave sleep, an increased percentage of stage II sleep, REM sleep at 16.8%. Total AHI was 6.8 per hour, REM AHI 28 per hour, supine sleep was nearly absent. Average oxygen saturation was 95%, nadir was 78%, time below 89% saturation of 24 minutes. Based on her sleep-related complaints and significant desaturations noted during REM sleep I suggested she return  for a full night CPAP titration study. She had this on 10/18/2016, sleep efficiency was 85.4%, sleep latency of 34 minutes, REM latency was 194 minutes. She had slow-wave sleep at 7.8%, REM sleep at 12.1%. She was fitted with medium nasal pillows and CPAP was titrated from 5 cm to 9 cm. On the final pressure her AHI was 0 per hour, nonsupine REM sleep was achieved and alternated was 86%. I suggested a home CPAP pressure of 10 cm.  12/29/16 Dr. Frances Furbish I reviewed her CPAP compliance data from 11/28/16 to 12/27/16, which is a total of 30 days, during which time she used her CPAP 26 days, with percent used days greater than 4 hours of 63%, indicating suboptimal compliance, average usage of 5 hours and 27 minutes, average AHI of 3/hour, leak low with the 95th percentile of 2.8 lpm on a pressure of 10 cm with EPR of 3  UPDATE  09/27/2018CM  Ms.  Yetta Barre, 62 year old  Female returns for follow-up with a history of obstructive sleep apnea on CPAP Compliance data dated 01/10/2017 through 02/08/2017 shows greater than 4 hours at 90% for 27  Days. Average usage 6 hours 37  Minutes.  10 cm of pressure. EPR 3. AHI 2.9AHI 2.9. Leak low  with the 95th percentile.  She reports less daytime drowiness. She has a new caregiver  who requires education on cleaning and handling the CPAP equipment. She returns for reevaluation  UPDATE 4/24/2019CM Ms. Yetta Barre, 62 year old female returns for follow-up with a history of  obstructive sleep apnea on CPAP.  She is not having any problems with her machine she denies any air leak from her mask.  She reports the last few days  she has been stuffy and not use the machine, she has allergies.  CPAP compliance dated 08/05/2017-09/03/2017 showed compliance greater than 4 hours 83% for 25 days.  Average usage 6 hours 38.  Set pressure 8 cm.  AHI 3.6 leaks 95 percentile at 2.4.  She has a caregiver 4 hours a day from 10-2 the patient is blind.  She returns for reevaluation of    REVIEW OF SYSTEMS: Out of  a complete 14 system review of symptoms, the patient complains only of the following symptoms, blind, gait impairment and all other reviewed systems are negative.   ALLERGIES: Allergies  Allergen Reactions   Penicillins Hives and Swelling    Has patient had a PCN reaction causing immediate rash, facial/tongue/throat swelling, SOB or lightheadedness with hypotension: yes- face swelling Has patient had a PCN reaction causing severe rash involving mucus membranes or skin necrosis: no Has patient had a PCN reaction that required hospitalization unknown (childhood allergy) Has patient had a PCN reaction occurring within the last 10 years: no If all of the above answers are "NO", then may proceed with Cephalosporin use.    Tramadol Nausea And Vomiting    Intense nausea   Vicodin [Hydrocodone-Acetaminophen] Nausea And Vomiting   Codeine Nausea Only  Other reaction(s): Unknown   Iodine-131 Hives   Iohexol Hives    Pt developed itching and hives along with nasal congestion; needs 13 hour premeds for future studies, Onset Date: [id number]    Lisinopril Cough    Other reaction(s): Unknown   Sulfa Antibiotics Nausea And Vomiting    HOME MEDICATIONS: Outpatient Medications Prior to Visit  Medication Sig Dispense Refill   albuterol (PROVENTIL HFA;VENTOLIN HFA) 108 (90 BASE) MCG/ACT inhaler Inhale 2 puffs into the lungs every 4 (four) hours as needed for wheezing or shortness of breath. For short     allopurinol (ZYLOPRIM) 300 MG tablet Take 300 mg by mouth 2 (two) times daily.     aspirin EC 81 MG tablet Take 81 mg by mouth daily.     blood glucose meter kit and supplies Dispense based on patient and insurance preference. Use up to four times daily as directed. (FOR ICD-10 E10.9, E11.9). 1 each 0   celecoxib (CELEBREX) 200 MG capsule celecoxib 200 mg capsule TK 1 C PO BID     cetirizine (ZYRTEC) 10 MG tablet Take 10 mg by mouth daily.     clotrimazole-betamethasone (LOTRISONE) cream Apply 1  application topically 2 (two) times daily. 30 g 0   colchicine 0.6 MG tablet Take by mouth.     Continuous Blood Gluc Sensor (DEXCOM G7 SENSOR) MISC 1 Device by Does not apply route as directed. 9 each 3   dapagliflozin propanediol (FARXIGA) 10 MG TABS tablet Take 1 tablet (10 mg total) by mouth daily. 90 tablet 3   docusate sodium (COLACE) 100 MG capsule Take 100 mg by mouth 2 (two) times daily.     Dulaglutide (TRULICITY) 3 MG/0.5ML SOAJ Inject 3 mg as directed once a week. 6 mL 3   EMBRACE TALK GLUCOSE TEST test strip USE TO CHECK BLOOD SUGARS FOUR TIMES A DAY BEFORE MEALS AND AT BEDTIME DX E11.29 300 strip 3   esomeprazole (NEXIUM) 40 MG capsule Take 1 capsule (40 mg total) by mouth daily. 30 capsule 0   fluticasone (FLONASE) 50 MCG/ACT nasal spray Place 2 sprays into both nostrils daily.     gabapentin (NEURONTIN) 400 MG capsule Take 400 mg by mouth 3 (three) times daily.     hydrOXYzine (ATARAX/VISTARIL) 25 MG tablet Take 0.5 tablets (12.5 mg total) by mouth every 8 (eight) hours as needed for itching. 30 tablet 0   insulin degludec (TRESIBA FLEXTOUCH) 200 UNIT/ML FlexTouch Pen Inject 8 Units into the skin daily at 6 (six) AM. 15 mL 2   insulin lispro (HUMALOG) 100 UNIT/ML KwikPen MAX DAILY 30 UNITS 30 mL 3   Insulin Pen Needle 32G X 4 MM MISC 1 Device by Does not apply route in the morning, at noon, in the evening, and at bedtime. 400 each 3   levofloxacin (LEVAQUIN) 750 MG tablet Take 750 mg by mouth daily.     levothyroxine (SYNTHROID) 150 MCG tablet Take 1 tablet (150 mcg total) by mouth daily. Needs appt for future refills 90 tablet 0   linaclotide (LINZESS) 145 MCG CAPS capsule Take 145 mcg by mouth daily as needed (constipation).     losartan (COZAAR) 25 MG tablet Take 25 mg by mouth daily.     naproxen (NAPROSYN) 500 MG tablet Take 500 mg by mouth 2 (two) times daily with a meal.     nitrofurantoin, macrocrystal-monohydrate, (MACROBID) 100 MG capsule Take 1 capsule (100 mg total) by  mouth 2 (two) times daily. 10 capsule 0  olmesartan (BENICAR) 20 MG tablet Take 10 mg by mouth daily.     olmesartan (BENICAR) 40 MG tablet Take 40 mg by mouth daily.     ondansetron (ZOFRAN ODT) 4 MG disintegrating tablet Take 1 tablet (4 mg total) by mouth every 8 (eight) hours as needed for nausea or vomiting. 10 tablet 0   potassium chloride SA (KLOR-CON) 20 MEQ tablet Take 1 tablet (20 mEq total) by mouth daily. Need appt for future refills 90 tablet 0   Prodigy Twist Top Lancets 28G MISC USE TO CHECK BLOOD SUGARS     simvastatin (ZOCOR) 20 MG tablet Take 1 tablet (20 mg total) by mouth every evening. 90 tablet 0   spironolactone (ALDACTONE) 25 MG tablet Take 0.5 tablets (12.5 mg total) by mouth daily. Needs appt for future refills 15 tablet 0   tiZANidine (ZANAFLEX) 2 MG tablet Take 2 mg by mouth 2 (two) times daily.     torsemide (DEMADEX) 20 MG tablet Take 4 tablets (80 mg total) by mouth daily. Need appt for future refills 120 tablet 0   No facility-administered medications prior to visit.    PAST MEDICAL HISTORY: Past Medical History:  Diagnosis Date   Arthritis    Asthma    Blind    Blindness and low vision    left eye glass eye,  legally blind in right eye   Diabetes mellitus    Diabetic neuropathy (HCC)    Hyperlipidemia    Hypertension    Hypothyroidism    Intractable nausea and vomiting 03/06/2018   Spinal stenosis     PAST SURGICAL HISTORY: Past Surgical History:  Procedure Laterality Date   ABDOMINAL HYSTERECTOMY  1990   BREAST BIOPSY Left    BREAST BIOPSY Right    BREAST BIOPSY Right 08/19/2022   [place] RT BREAST BX W LOC DEV 1ST LESION IMG BX SPEC US GUIDE 08/19/2022 [facility]   CESAREAN SECTION  1987, 1990   x 2   CHOLECYSTECTOMY  2008   ENUCLEATION Bilateral 09/15/1998   EYE SURGERY  2016   fitting artificial eye    FAMILY HISTORY: Family History  Problem Relation Age of Onset   Diabetes Sister    Glaucoma Sister    Hypertension Sister     Healthy Mother    Healthy Father    Stroke Brother    Heart attack Paternal Aunt    Breast cancer Neg Hx     SOCIAL HISTORY: Social History   Socioeconomic History   Marital status: Single    Spouse name: Not on file   Number of children: 2   Years of education: 12   Highest education level: Not on file  Occupational History   Occupation: N/A    Comment: disabled  Tobacco Use   Smoking status: Former    Current packs/day: 0.00    Types: Cigarettes    Quit date: 05/22/1992    Years since quitting: 30.9   Smokeless tobacco: Never  Vaping Use   Vaping status: Never Used  Substance and Sexual Activity   Alcohol use: No    Comment: quit 20 yrs ago   Drug use: No   Sexual activity: Not Currently  Other Topics Concern   Not on file  Social History Narrative   Lives alone   caffeine drinks 2 cups of coffee a day, occasional soda     Right Handed   Social Determinants of Health   Financial Resource Strain: Medium Risk (10/02/2019)   Overall  Financial Resource Strain (CARDIA)    Difficulty of Paying Living Expenses: Somewhat hard  Food Insecurity: No Food Insecurity (10/02/2019)   Hunger Vital Sign    Worried About Running Out of Food in the Last Year: Never true    Ran Out of Food in the Last Year: Never true  Transportation Needs: No Transportation Needs (05/29/2019)   PRAPARE - Administrator, Civil Service (Medical): No    Lack of Transportation (Non-Medical): No  Physical Activity: Not on file  Stress: Not on file  Social Connections: Unknown (03/09/2023)   [facility]   Social Network    Social Network: Not on file  Intimate Partner Violence: Unknown (03/09/2023)   Received from [facility]   HITS    Physically Hurt: Not on file    Insult or Talk Down To: Not on file    Threaten Physical Harm: Not on file    Scream or Curse: Not on file      PHYSICAL EXAM  There were no vitals filed for this visit.  There is no height or  weight on file to calculate BMI.  Generalized: Well developed, in no acute distress  Cardiology: normal rate and rhythm, no murmur noted Respiratory: clear to auscultation bilaterally  Neurological examination  Mentation: Alert oriented to time, place, history taking. Follows all commands speech and language fluent Cranial nerve II-XII: Patient is blind. Facial sensation and strength were normal. Head turning and shoulder shrug  were normal and symmetric. Motor: The motor testing reveals 5 over 5 strength of all 4 extremities.  Gait and station: walks with Rolator guided by her sister  DIAGNOSTIC DATA (LABS, IMAGING, TESTING) - I reviewed patient records, labs, notes, testing and imaging myself where available.      No data to display           Lab Results  Component Value Date   WBC 9.5 02/19/2023   HGB 12.1 02/19/2023   HCT 38.0 02/19/2023   MCV 82.6 02/19/2023   PLT 192 02/19/2023      Component Value Date/Time   NA 144 02/19/2023 2342   NA 145 (H) 06/08/2020 1543   K 4.3 02/19/2023 2342   CL 105 02/19/2023 2342   CO2 26 02/19/2023 2342   GLUCOSE 248 (H) 02/19/2023 2342   BUN 28 (H) 02/19/2023 2342   BUN 44 (H) 06/08/2020 1543   CREATININE 2.13 (H) 02/19/2023 2342   CREATININE 0.92 10/22/2012 1351   CALCIUM 9.4 02/19/2023 2342   PROT 6.5 02/19/2023 2342   PROT 7.7 04/13/2020 1150   ALBUMIN 3.2 (L) 02/19/2023 2342   ALBUMIN 4.0 04/13/2020 1150   AST 13 (L) 02/19/2023 2342   ALT 10 02/19/2023 2342   ALKPHOS 85 02/19/2023 2342   BILITOT 0.2 (L) 02/19/2023 2342   BILITOT 0.2 04/13/2020 1150   GFRNONAA 26 (L) 02/19/2023 2342   GFRAA 43 (L) 06/08/2020 1543   Lab Results  Component Value Date   CHOL 126 04/13/2020   HDL 42 04/13/2020   LDLCALC 66 04/13/2020   TRIG 95 04/13/2020   CHOLHDL 3.0 04/13/2020   Lab Results  Component Value Date   HGBA1C 7.7 (A) 03/23/2023   Lab Results  Component Value Date   VITAMINB12 325 05/25/2011   Lab Results   Component Value Date   TSH 2.270 04/13/2020       ASSESSMENT AND PLAN 62 y.o. year old female  has a past medical history of Arthritis, Asthma,  Blind, Blindness and low vision, Diabetes mellitus, Diabetic neuropathy (HCC), Hyperlipidemia, Hypertension, Hypothyroidism, Intractable nausea and vomiting (03/06/2018), and Spinal stenosis. here with   No diagnosis found.    She was previously doing well on CPAP therapy but has not used since 07/2021. No recent compliance data. She has lost 70lbs over the past couple of years. Split night study in 09/2016 showed moderate OSA in REM sleep. We will repeat HST. She was encouraged to use her CPAP in the meantime. She was encouraged to reach out to [employer] and provided the number to call for supplies. We will reevaluate need pending new sleep study results. She will continue close follow-up with her healthcare team.  She will follow-up with me pending sleep results. She verbalizes understanding and agreement with this plan.   No orders of the defined types were placed in this encounter.    No orders of the defined types were placed in this encounter.     Shawnie Dapper, FNP-C 04/18/2023, 12:25 PM [facility] [address] [place], [place] [address] [phone]

## 2023-04-18 NOTE — Patient Instructions (Incomplete)

## 2023-04-19 ENCOUNTER — Ambulatory Visit: Payer: 59 | Admitting: Family Medicine

## 2023-04-19 DIAGNOSIS — G4733 Obstructive sleep apnea (adult) (pediatric): Secondary | ICD-10-CM

## 2023-04-28 ENCOUNTER — Encounter (HOSPITAL_COMMUNITY): Payer: Self-pay | Admitting: Gastroenterology

## 2023-05-04 ENCOUNTER — Telehealth: Payer: Self-pay | Admitting: Anesthesiology

## 2023-05-04 NOTE — Telephone Encounter (Signed)
Called pt regarding her CPAP supplies request through [employer]. Pt was confused and thought she had an appointment scheduled with [place], but I told her we didn't have one, I offered to get her scheduled but she said she didn't have her calendar with her and requested a call back to get her scheduled.

## 2023-05-05 ENCOUNTER — Encounter (HOSPITAL_COMMUNITY): Payer: Self-pay | Admitting: Gastroenterology

## 2023-05-05 ENCOUNTER — Ambulatory Visit (HOSPITAL_COMMUNITY)
Admission: RE | Admit: 2023-05-05 | Discharge: 2023-05-05 | Disposition: A | Discharge status: Home / Self Care | Payer: 59 | Attending: Gastroenterology | Admitting: Gastroenterology

## 2023-05-05 ENCOUNTER — Encounter (HOSPITAL_COMMUNITY)
Admission: RE | Disposition: A | Discharge status: Home / Self Care | Payer: Self-pay | Source: Home / Self Care | Attending: Gastroenterology

## 2023-05-05 ENCOUNTER — Ambulatory Visit (HOSPITAL_COMMUNITY): Payer: 59 | Admitting: Anesthesiology

## 2023-05-05 ENCOUNTER — Other Ambulatory Visit: Payer: Self-pay

## 2023-05-05 DIAGNOSIS — G4730 Sleep apnea, unspecified: Secondary | ICD-10-CM | POA: Diagnosis not present

## 2023-05-05 DIAGNOSIS — Z794 Long term (current) use of insulin: Secondary | ICD-10-CM | POA: Insufficient documentation

## 2023-05-05 DIAGNOSIS — D124 Benign neoplasm of descending colon: Secondary | ICD-10-CM | POA: Insufficient documentation

## 2023-05-05 DIAGNOSIS — E1140 Type 2 diabetes mellitus with diabetic neuropathy, unspecified: Secondary | ICD-10-CM | POA: Insufficient documentation

## 2023-05-05 DIAGNOSIS — I10 Essential (primary) hypertension: Secondary | ICD-10-CM | POA: Diagnosis not present

## 2023-05-05 DIAGNOSIS — D122 Benign neoplasm of ascending colon: Secondary | ICD-10-CM | POA: Insufficient documentation

## 2023-05-05 DIAGNOSIS — Z1211 Encounter for screening for malignant neoplasm of colon: Secondary | ICD-10-CM

## 2023-05-05 DIAGNOSIS — Z860100 Personal history of colon polyps, unspecified: Secondary | ICD-10-CM

## 2023-05-05 DIAGNOSIS — D123 Benign neoplasm of transverse colon: Secondary | ICD-10-CM | POA: Insufficient documentation

## 2023-05-05 DIAGNOSIS — Z87891 Personal history of nicotine dependence: Secondary | ICD-10-CM | POA: Insufficient documentation

## 2023-05-05 DIAGNOSIS — J45909 Unspecified asthma, uncomplicated: Secondary | ICD-10-CM | POA: Diagnosis not present

## 2023-05-05 DIAGNOSIS — K219 Gastro-esophageal reflux disease without esophagitis: Secondary | ICD-10-CM | POA: Insufficient documentation

## 2023-05-05 DIAGNOSIS — Z7984 Long term (current) use of oral hypoglycemic drugs: Secondary | ICD-10-CM | POA: Diagnosis not present

## 2023-05-05 DIAGNOSIS — D120 Benign neoplasm of cecum: Secondary | ICD-10-CM | POA: Insufficient documentation

## 2023-05-05 HISTORY — PX: COLONOSCOPY WITH PROPOFOL: SHX5780

## 2023-05-05 HISTORY — PX: POLYPECTOMY: SHX5525

## 2023-05-05 LAB — GLUCOSE, CAPILLARY
Glucose-Capillary: 73 mg/dL (ref 70–99)
Glucose-Capillary: 64 mg/dL — ABNORMAL LOW (ref 70–99)
Glucose-Capillary: 68 mg/dL — ABNORMAL LOW (ref 70–99)
Glucose-Capillary: 69 mg/dL — ABNORMAL LOW (ref 70–99)
Glucose-Capillary: 89 mg/dL (ref 70–99)

## 2023-05-05 SURGERY — COLONOSCOPY WITH PROPOFOL
Anesthesia: Monitor Anesthesia Care

## 2023-05-05 MED ORDER — LIDOCAINE HCL 1 % IJ SOLN
INTRAMUSCULAR | Status: DC | PRN
  Administered 2023-05-05: 50 mg via INTRADERMAL

## 2023-05-05 MED ORDER — PHENYLEPHRINE HCL (PRESSORS) 10 MG/ML IV SOLN
INTRAVENOUS | Status: DC | PRN
  Administered 2023-05-05: 80 ug via INTRAVENOUS
  Administered 2023-05-05: 160 ug via INTRAVENOUS

## 2023-05-05 MED ORDER — PROPOFOL 500 MG/50ML IV EMUL
INTRAVENOUS | Status: AC
  Filled 2023-05-05: qty 50

## 2023-05-05 MED ORDER — EPHEDRINE SULFATE (PRESSORS) 50 MG/ML IJ SOLN
INTRAMUSCULAR | Status: DC | PRN
  Administered 2023-05-05: 2.5 mg via INTRAVENOUS
  Administered 2023-05-05 (×2): 5 mg via INTRAVENOUS

## 2023-05-05 MED ORDER — PROPOFOL 500 MG/50ML IV EMUL
INTRAVENOUS | Status: DC | PRN
  Administered 2023-05-05: 120 ug/kg/min via INTRAVENOUS

## 2023-05-05 MED ORDER — PROPOFOL 10 MG/ML IV BOLUS
INTRAVENOUS | Status: DC | PRN
  Administered 2023-05-05: 20 mg via INTRAVENOUS

## 2023-05-05 MED ORDER — SODIUM CHLORIDE 0.9 % IV SOLN: INTRAVENOUS | Status: DC | PRN

## 2023-05-05 SURGICAL SUPPLY — 20 items
ELECT REM PT RETURN 9FT ADLT (ELECTROSURGICAL)
ELECTRODE REM PT RTRN 9FT ADLT (ELECTROSURGICAL) IMPLANT
FCP BXJMBJMB 240X2.8X (CUTTING FORCEPS)
FLOOR PAD 36X40 (MISCELLANEOUS) ×2
FORCEPS BIOP RAD 4 LRG CAP 4 (CUTTING FORCEPS) IMPLANT
FORCEPS BXJMBJMB 240X2.8X (CUTTING FORCEPS) IMPLANT
INJECTOR/SNARE I SNARE (MISCELLANEOUS) IMPLANT
LUBRICANT JELLY 4.5OZ STERILE (MISCELLANEOUS) IMPLANT
MANIFOLD NEPTUNE II (INSTRUMENTS) IMPLANT
NDL SCLEROTHERAPY 25GX240 (NEEDLE) IMPLANT
NEEDLE SCLEROTHERAPY 25GX240 (NEEDLE) IMPLANT
PAD FLOOR 36X40 (MISCELLANEOUS) ×2 IMPLANT
PROBE APC STR FIRE (PROBE) IMPLANT
PROBE INJECTION GOLD 7FR (MISCELLANEOUS) IMPLANT
SNARE ROTATE MED OVAL 20MM (MISCELLANEOUS) IMPLANT
SYR 50ML LL SCALE MARK (SYRINGE) IMPLANT
TRAP SPECIMEN MUCOUS 40CC (MISCELLANEOUS) IMPLANT
TUBING ENDO SMARTCAP PENTAX (MISCELLANEOUS) IMPLANT
TUBING IRRIGATION ENDOGATOR (MISCELLANEOUS) ×2 IMPLANT
WATER STERILE IRR 1000ML POUR (IV SOLUTION) IMPLANT

## 2023-05-05 NOTE — Discharge Instructions (Signed)
YOU HAD AN ENDOSCOPIC PROCEDURE TODAY: Refer to the procedure report and other information in the discharge instructions given to you for any specific questions about what was found during the examination. If this information does not answer your questions, please call [facility] at [phone] to clarify.  ° °YOU SHOULD EXPECT: Some feelings of bloating in the abdomen. Passage of more gas than usual. Walking can help get rid of the air that was put into your GI tract during the procedure and reduce the bloating. If you had a lower endoscopy (such as a colonoscopy or flexible sigmoidoscopy) you may notice spotting of blood in your stool or on the toilet paper. Some abdominal soreness may be present for a day or two, also. ° °DIET: Your first meal following the procedure should be a light meal and then it is ok to progress to your normal diet. A half-sandwich or bowl of soup is an example of a good first meal. Heavy or fried foods are harder to digest and may make you feel nauseous or bloated. Drink plenty of fluids but you should avoid alcoholic beverages for 24 hours. If you had an esophageal dilation, please see attached information for diet.  ° °ACTIVITY: Your care partner should take you home directly after the procedure. You should plan to take it easy, moving slowly for the rest of the day. You can resume normal activity the day after the procedure however YOU SHOULD NOT DRIVE, use power tools, machinery or perform tasks that involve climbing or major physical exertion for 24 hours (because of the sedation medicines used during the test).  ° °SYMPTOMS TO REPORT IMMEDIATELY: °A gastroenterologist can be reached at any hour. Please call [phone]  for any of the following symptoms:  °Following lower endoscopy (colonoscopy, flexible sigmoidoscopy) °Excessive amounts of blood in the stool  °Significant tenderness, worsening of abdominal pains  °Swelling of the abdomen that is new, acute  °Fever of  100° or higher  °Following upper endoscopy (EGD, EUS, ERCP, esophageal dilation) °Vomiting of blood or coffee ground material  °New, significant abdominal pain  °New, significant chest pain or pain under the shoulder blades  °Painful or persistently difficult swallowing  °New shortness of breath  °Black, tarry-looking or red, bloody stools ° °FOLLOW UP:  °If any biopsies were taken you will be contacted by phone or by letter within the next 1-3 weeks. Call [phone]  if you have not heard about the biopsies in 3 weeks.  °Please also call with any specific questions about appointments or follow up tests. ° °

## 2023-05-05 NOTE — Op Note (Signed)
[facility] Patient Name: Amy [name] Procedure Date: 05/05/2023 MRN: [id number] Attending MD: Jeani Hawking , MD, [id number] Date of Birth: [birth year] CSN: [id number] Age: 62 Admit Type: Outpatient Procedure:                Colonoscopy Indications:              High risk colon cancer surveillance: Personal                            history of colonic polyps Providers:                Jeani Hawking, MD, Rogue Jury, RN, Fransisca Connors, Rhodia Albright, Technician, Salley Scarlet,                            Technician Referring MD:              Medicines:                 Complications:            No immediate complications. Estimated Blood Loss:     Estimated blood loss: none. Procedure:                Pre-Anesthesia Assessment:                           - Prior to the procedure, a History and Physical                            was performed, and patient medications and                            allergies were reviewed. The patient's tolerance of                            previous anesthesia was also reviewed. The risks                            and benefits of the procedure and the sedation                            options and risks were discussed with the patient.                            All questions were answered, and informed consent                            was obtained. Prior Anticoagulants: The patient has                            taken no anticoagulant or antiplatelet agents. ASA                            Grade Assessment: III - A patient  with severe                            systemic disease. After reviewing the risks and                            benefits, the patient was deemed in satisfactory                            condition to undergo the procedure.                           - Sedation was administered by an anesthesia                            professional. Deep sedation was attained.                            After obtaining informed consent, the colonoscope                            was passed under direct vision. Throughout the                            procedure, the patient's blood pressure, pulse, and                            oxygen saturations were monitored continuously. The                            CF-HQ190L ([id number]) Olympus colonoscope was                            introduced through the anus and advanced to the the                            cecum, identified by appendiceal orifice and                            ileocecal valve. The colonoscopy was performed                            without difficulty. The patient tolerated the                            procedure well. The quality of the bowel                            preparation was evaluated using the BBPS [facility]                            Bowel Preparation Scale) with scores of: Right                            Colon = 3,  Transverse Colon = 3 and Left Colon = 3                            (entire mucosa seen well with no residual staining,                            small fragments of stool or opaque liquid). The                            total BBPS score equals 9. The ileocecal valve,                            appendiceal orifice, and rectum were photographed. Scope In: 10:00:52 AM Scope Out: 10:20:09 AM Scope Withdrawal Time: 0 hours 15 minutes 0 seconds  Total Procedure Duration: 0 hours 19 minutes 17 seconds  Findings:      Six sessile polyps were found in the descending colon, transverse colon,       ascending colon and cecum. The polyps were 3 to 10 mm in size. These       polyps were removed with a cold snare. Resection and retrieval were       complete. Impression:               - Six 3 to 10 mm polyps in the descending colon, in                            the transverse colon, in the ascending colon and in                            the cecum, removed with a cold snare. Resected and                             retrieved. Moderate Sedation:      Not Applicable - Patient had care per Anesthesia. Recommendation:           - Patient has a contact number available for                            emergencies. The signs and symptoms of potential                            delayed complications were discussed with the                            patient. Return to normal activities tomorrow.                            Written discharge instructions were provided to the                            patient.                           - Resume previous diet.                           -  Continue present medications.                           - Await pathology results.                           - Repeat colonoscopy in 3 years for surveillance. Procedure Code(s):        --- Professional ---                           [phone], Colonoscopy, flexible; with removal of                            tumor(s), polyp(s), or other lesion(s) by snare                            technique Diagnosis Code(s):        --- Professional ---                           D12.4, Benign neoplasm of descending colon                           Z86.010, Personal history of colonic polyps                           D12.3, Benign neoplasm of transverse colon (hepatic                            flexure or splenic flexure)                           D12.2, Benign neoplasm of ascending colon                           D12.0, Benign neoplasm of cecum CPT copyright 2022 American Medical Association. All rights reserved. The codes documented in this report are preliminary and upon coder review may  be revised to meet current compliance requirements. Jeani Hawking, MD Jeani Hawking, MD 05/05/2023 10:24:24 AM This report has been signed electronically. Number of Addenda: 0

## 2023-05-05 NOTE — Transfer of Care (Signed)
Immediate Anesthesia Transfer of Care Note  [name]  Procedure(s) Performed: COLONOSCOPY WITH PROPOFOL POLYPECTOMY  Patient Location: PACU and Endoscopy Unit  Anesthesia Type:MAC  Level of Consciousness: awake, alert , oriented, and patient cooperative  Airway & Oxygen Therapy: Patient Spontanous Breathing and Patient connected to face mask oxygen  Post-op Assessment: Report given to RN and Post -op Vital signs reviewed and stable  Post vital signs: Reviewed and stable  Last Vitals:  Vitals Value Taken Time  BP 129/54 05/05/23 1030  Temp 36.3 C 05/05/23 1028  Pulse 67 05/05/23 1031  Resp 14 05/05/23 1031  SpO2 100 % 05/05/23 1031  Vitals shown include unfiled device data.  Last Pain:  Vitals:   05/05/23 1028  TempSrc: Temporal  PainSc: 0-No pain         Complications: No notable events documented.

## 2023-05-05 NOTE — Anesthesia Postprocedure Evaluation (Signed)
Anesthesia Post Note  [name]  Procedure(s) Performed: COLONOSCOPY WITH PROPOFOL POLYPECTOMY     Patient location during evaluation: PACU Anesthesia Type: MAC Level of consciousness: awake and alert Pain management: pain level controlled Vital Signs Assessment: post-procedure vital signs reviewed and stable Respiratory status: spontaneous breathing, nonlabored ventilation and respiratory function stable Cardiovascular status: blood pressure returned to baseline Postop Assessment: no apparent nausea or vomiting Anesthetic complications: no   No notable events documented.  Last Vitals:  Vitals:   05/05/23 1040 05/05/23 1050  BP: (!) 146/65 (!) 174/91  Pulse: 67 71  Resp: 17 19  Temp:    SpO2: 100% 99%    Last Pain:  Vitals:   05/05/23 1050  TempSrc:   PainSc: 0-No pain                 [name]

## 2023-05-05 NOTE — Anesthesia Preprocedure Evaluation (Addendum)
Anesthesia Evaluation  Patient identified by MRN, date of birth, ID band Patient awake    Reviewed: Allergy & Precautions, NPO status , Patient's Chart, lab work & pertinent test results  History of Anesthesia Complications Negative for: history of anesthetic complications  Airway Mallampati: II  TM Distance: >3 FB Neck ROM: Full    Dental  (+) Edentulous Lower, Edentulous Upper   Pulmonary asthma , sleep apnea , former smoker   Pulmonary exam normal        Cardiovascular hypertension, Pt. on medications Normal cardiovascular exam  TTE 11/16/22: EF 60-65%, mild LVH, grade I DD     Neuro/Psych negative neurological ROS     GI/Hepatic Neg liver ROS,GERD  ,,  Endo/Other  diabetes, Type 2, Oral Hypoglycemic Agents, Insulin DependentHypothyroidism    Renal/GU Renal InsufficiencyRenal disease     Musculoskeletal  (+) Arthritis ,    Abdominal   Peds  Hematology negative hematology ROS (+)   Anesthesia Other Findings Blind  Reproductive/Obstetrics                             Anesthesia Physical Anesthesia Plan  ASA: 2  Anesthesia Plan: MAC   Post-op Pain Management: Minimal or no pain anticipated   Induction:   PONV Risk Score and Plan: 2 and Treatment may vary due to age or medical condition and Propofol infusion  Airway Management Planned: Natural Airway and Nasal Cannula  Additional Equipment: None  Intra-op Plan:   Post-operative Plan:   Informed Consent: I have reviewed the patients History and Physical, chart, labs and discussed the procedure including the risks, benefits and alternatives for the proposed anesthesia with the patient or authorized representative who has indicated his/her understanding and acceptance.       Plan Discussed with: CRNA  Anesthesia Plan Comments:         Anesthesia Quick Evaluation

## 2023-05-05 NOTE — H&P (Signed)
Amy [name] HPI: Her colonoscopy on 01/19/2016 was positive for a couple of small adenomas.  She was recommended a 7 year follow up.  Constipation is a problem for her and she started taking Trulicity one year ago.  Over this past year her constipation worsened.  She tries to self-treated with prune juice and Miralax without any benefit.  The patient denies any issues with chest pain, SOB, or MI.  There was no interval development of colon cancer in her family.  She was diagnosed with severe sleep apnea 2 years ago and she uses a CPAP.  Esomeprazole helps with her GERD  Past Medical History:  Diagnosis Date   Arthritis    Asthma    Blind    Blindness and low vision    left eye glass eye,  legally blind in right eye   Diabetes mellitus    Diabetic neuropathy (HCC)    Hyperlipidemia    Hypertension    Hypothyroidism    Intractable nausea and vomiting 03/06/2018   Spinal stenosis     Past Surgical History:  Procedure Laterality Date   ABDOMINAL HYSTERECTOMY  1990   BREAST BIOPSY Left    BREAST BIOPSY Right    BREAST BIOPSY Right 08/19/2022   [place] RT BREAST BX W LOC DEV 1ST LESION IMG BX SPEC US GUIDE 08/19/2022 [facility]   CESAREAN SECTION  1987, 1990   x 2   CHOLECYSTECTOMY  2008   ENUCLEATION Bilateral 09/15/1998   EYE SURGERY  2016   fitting artificial eye    Family History  Problem Relation Age of Onset   Diabetes Sister    Glaucoma Sister    Hypertension Sister    Healthy Mother    Healthy Father    Stroke Brother    Heart attack Paternal Aunt    Breast cancer Neg Hx     Social History:  reports that she quit smoking about 30 years ago. Her smoking use included cigarettes. She has never used smokeless tobacco. She reports that she does not drink alcohol and does not use drugs.  Allergies:  Allergies  Allergen Reactions   Penicillins Hives and Swelling    Has patient had a PCN reaction causing immediate rash, facial/tongue/throat swelling, SOB or  lightheadedness with hypotension: yes- face swelling Has patient had a PCN reaction causing severe rash involving mucus membranes or skin necrosis: no Has patient had a PCN reaction that required hospitalization unknown (childhood allergy) Has patient had a PCN reaction occurring within the last 10 years: no If all of the above answers are "NO", then may proceed with Cephalosporin use.    Tramadol Nausea And Vomiting    Intense nausea   Vicodin [Hydrocodone-Acetaminophen] Nausea And Vomiting   Codeine Nausea Only    Other reaction(s): Unknown   Iodine-131 Hives   Iohexol Hives    Pt developed itching and hives along with nasal congestion; needs 13 hour premeds for future studies, Onset Date: [id number]    Lisinopril Cough    Other reaction(s): Unknown   Sulfa Antibiotics Nausea And Vomiting    Medications: Scheduled: Continuous:  Results for orders placed or performed during the hospital encounter of 05/05/23 (from the past 24 hours)  Glucose, capillary     Status: None   Collection Time: 05/05/23  9:17 AM  Result Value Ref Range   Glucose-Capillary 73 70 - 99 mg/dL     No results found.  ROS:  As stated above in the HPI otherwise negative.  Blood pressure (!) 169/79, pulse 63, temperature 97.7 F (36.5 C), temperature source Tympanic, resp. rate 14, height 4\' 11"  (1.499 m), weight 81.2 kg, SpO2 100%.    PE: Gen: NAD, Alert and Oriented HEENT:  [place]/AT, EOMI Neck: Supple, no LAD Lungs: CTA Bilaterally CV: RRR without M/G/R ABD: Soft, NTND, +BS Ext: No C/C/E  Assessment/Plan: 1) Personal history of polyps - colonoscopy.  Kenneth Cuaresma D 05/05/2023, 9:24 AM

## 2023-05-06 ENCOUNTER — Encounter (HOSPITAL_COMMUNITY): Payer: Self-pay | Admitting: Gastroenterology

## 2023-05-09 LAB — SURGICAL PATHOLOGY

## 2023-05-09 NOTE — Telephone Encounter (Signed)
Noted  

## 2023-05-23 ENCOUNTER — Encounter: Payer: Self-pay | Admitting: Podiatry

## 2023-05-23 ENCOUNTER — Ambulatory Visit (INDEPENDENT_AMBULATORY_CARE_PROVIDER_SITE_OTHER): Payer: 59 | Admitting: Podiatry

## 2023-05-23 DIAGNOSIS — E1151 Type 2 diabetes mellitus with diabetic peripheral angiopathy without gangrene: Secondary | ICD-10-CM

## 2023-05-23 DIAGNOSIS — M79675 Pain in left toe(s): Secondary | ICD-10-CM

## 2023-05-23 DIAGNOSIS — B351 Tinea unguium: Secondary | ICD-10-CM | POA: Diagnosis not present

## 2023-05-23 DIAGNOSIS — M79674 Pain in right toe(s): Secondary | ICD-10-CM | POA: Diagnosis not present

## 2023-05-23 NOTE — Progress Notes (Signed)
 Subjective:  Patient ID: Amy [name], female    DOB: 01-23-1961,  MRN: [id number]  Amy [name] presents to clinic today for:  Chief Complaint  Patient presents with   Diabetes    Patient is here for routine foot care   Patient notes nails are thick and elongated, causing pain in shoe gear when ambulating.    PCP is [facility].  Last seen around 03/23/2023  Past Medical History:  Diagnosis Date   Arthritis    Asthma    Blind    Blindness and low vision    left eye glass eye,  legally blind in right eye   Diabetes mellitus    Diabetic neuropathy (HCC)    Hyperlipidemia    Hypertension    Hypothyroidism    Intractable nausea and vomiting 03/06/2018   Spinal stenosis     Allergies  Allergen Reactions   Penicillins Hives and Swelling    Has patient had a PCN reaction causing immediate rash, facial/tongue/throat swelling, SOB or lightheadedness with hypotension: yes- face swelling Has patient had a PCN reaction causing severe rash involving mucus membranes or skin necrosis: no Has patient had a PCN reaction that required hospitalization unknown (childhood allergy) Has patient had a PCN reaction occurring within the last 10 years: no If all of the above answers are NO, then may proceed with Cephalosporin use.    Tramadol  Nausea And Vomiting    Intense nausea   Vicodin [Hydrocodone -Acetaminophen ] Nausea And Vomiting   Codeine Nausea Only    Other reaction(s): Unknown   Iodine-131 Hives   Iohexol Hives    Pt developed itching and hives along with nasal congestion; needs 13 hour premeds for future studies, Onset Date: [id number]    Lisinopril Cough    Other reaction(s): Unknown   Sulfa Antibiotics Nausea And Vomiting   Objective:  Amy [name] is a pleasant 63 y.o. female in NAD. AAO x 3.  Vascular Examination: Patient has palpable DP pulse, absent PT pulse bilateral.  Delayed capillary refill bilateral toes.  Sparse digital hair  bilateral.  Proximal to distal cooling WNL bilateral.    Dermatological Examination: Interspaces are clear with no open lesions noted bilateral.  Skin is shiny and atrophic bilateral.  Nails are 3-51mm thick, with yellowish/brown discoloration, subungual debris and distal onycholysis x10.  There is pain with compression of nails x10.  And no significant callus formation is noted today feels that they are dry and slightly scaling along the periphery     Latest Ref Rng & Units 03/23/2023   10:59 AM 07/20/2022   10:26 AM  Hemoglobin A1C  Hemoglobin-A1c 4.0 - 5.6 % 7.7  8.7    Patient qualifies for at-risk foot care because of diabetes with PVD.  Assessment/Plan: 1. Pain due to onychomycosis of toenails of both feet   2. Type II diabetes mellitus with peripheral circulatory disorder (HCC)     Mycotic nails x10 were sharply debrided with sterile nail nippers and power debriding burr to decrease bulk and length.  Recommend either Bag Balm cream, Working Hands cream or 40% urea  +2% salicylic acid cream to be applied to the heels moderately at bedtime to help with the roughened skin on both heels.   Return in about 3 months (around 08/21/2023) for [facility].   Awanda CHARM [place], DPM, FACFAS [facility]     [address]  [place], [place] [address]                Office [phone]  Fax [phone]

## 2023-06-02 IMAGING — MR MR CERVICAL SPINE W/O CM
4 of 5 series · 27 of 48 positions shown · non-contrast
Comparison: 12/12/2017

CLINICAL DATA: Neck pain extending down the left arm for 6 months

EXAM:
MRI CERVICAL SPINE WITHOUT CONTRAST
TECHNIQUE: Multiplanar, multisequence MR imaging of the cervical spine was
performed. No intravenous contrast was administered.

[Series 5: T2 · sagittal · 3.0mm · 0.55mm/px · 6 of 15 slices shown (1 of 2)]
[im 1/15]
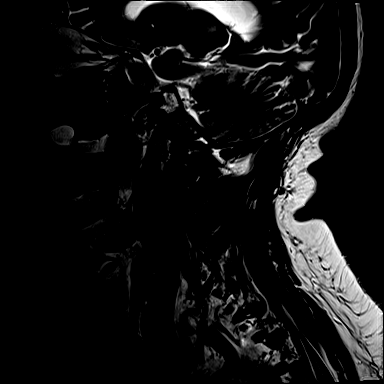
[im 3/15]
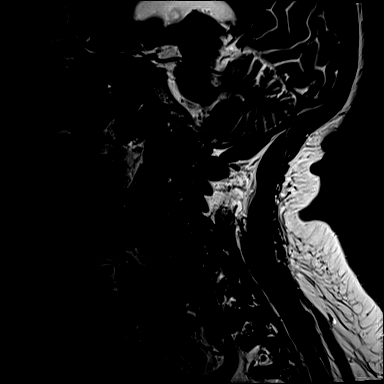
[im 6/15]
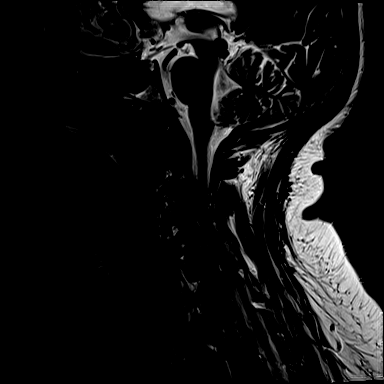
[im 9/15]
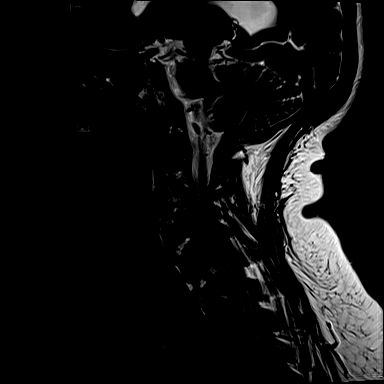
[im 12/15]
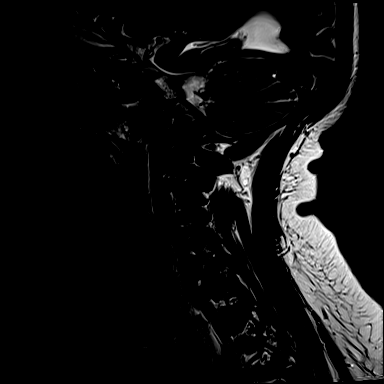
[im 15/15]
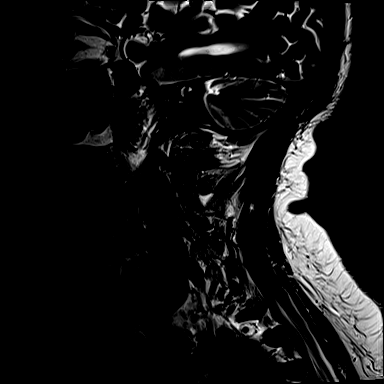

[Series 6: T1 · sagittal · 3.0mm · 0.66mm/px · 7 of 15 slices shown]
[im 1/15]
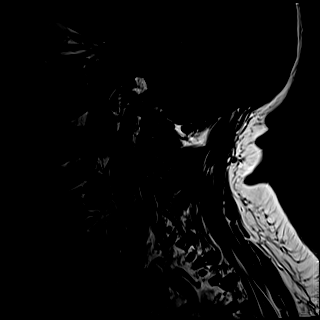
[im 3/15]
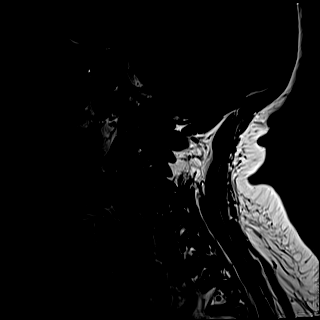
[im 5/15]
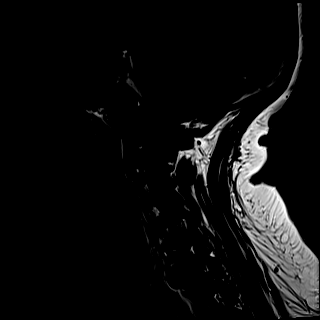
[im 8/15]
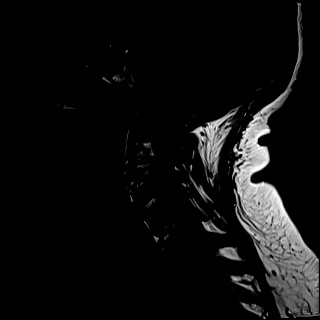
[im 10/15]
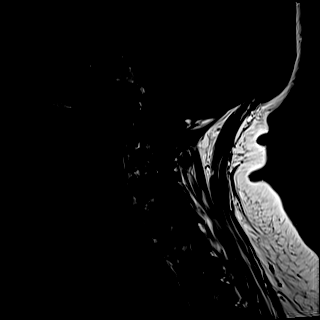
[im 12/15]
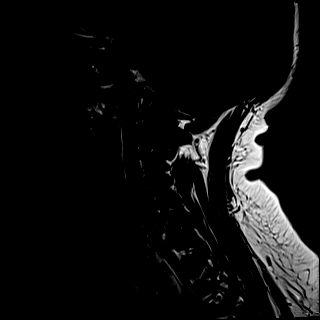
[im 15/15]
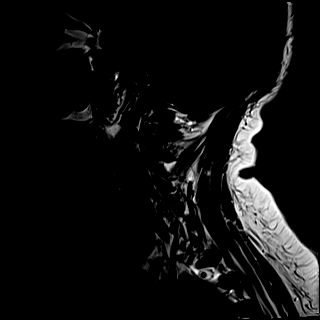

[Series 7: STIR · sagittal · 3.0mm · 0.33mm/px · 6 of 15 slices shown]
[im 1/15]
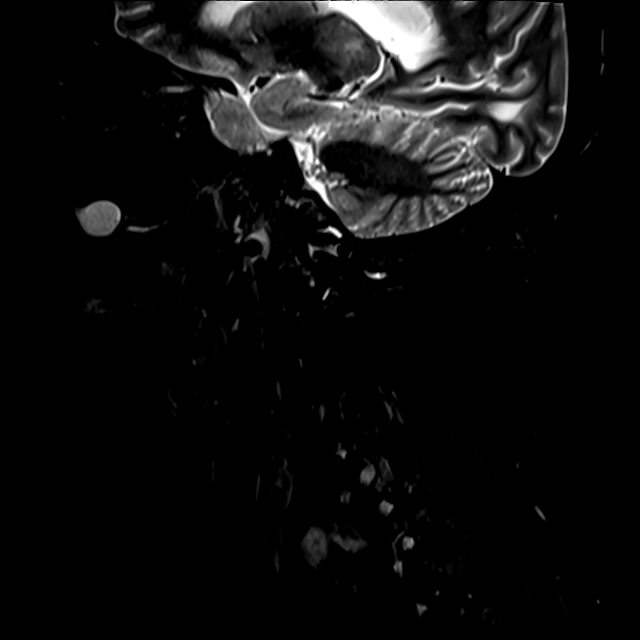
[im 3/15]
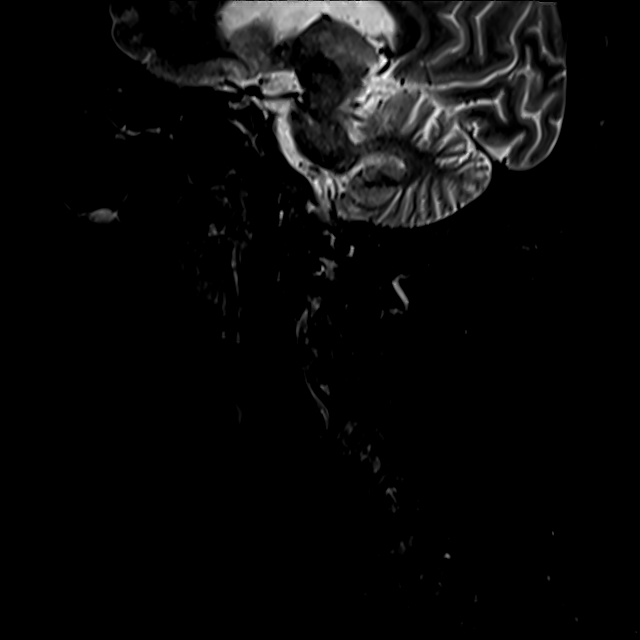
[im 5/15]
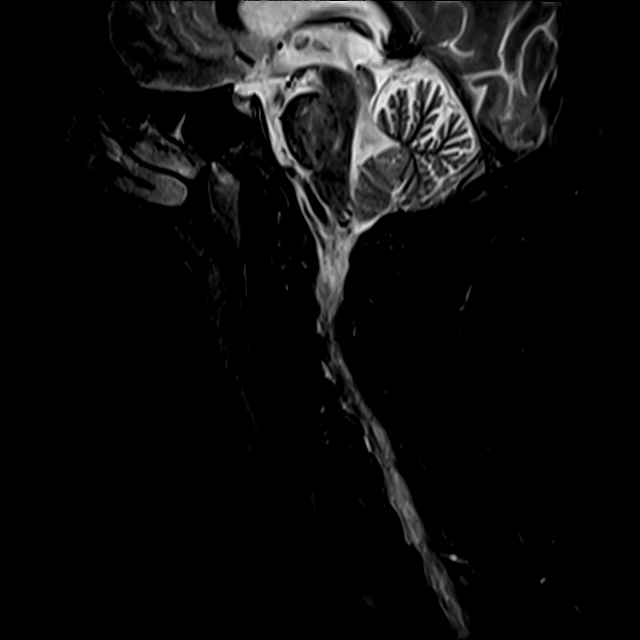
[im 8/15]
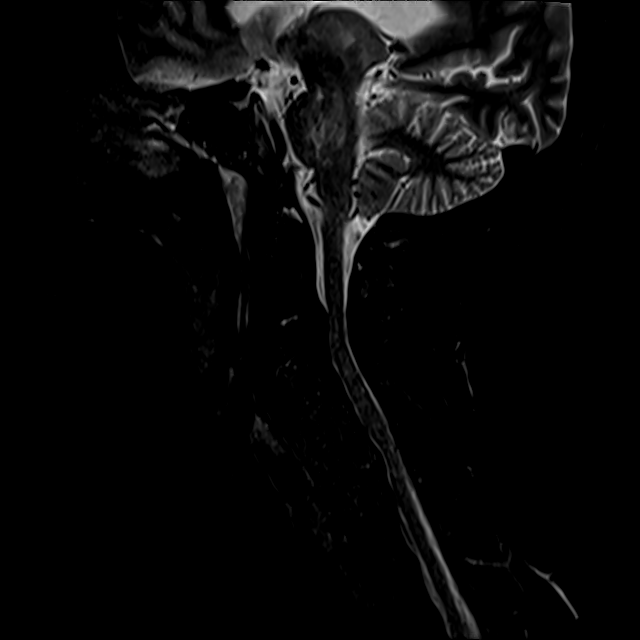
[im 10/15]
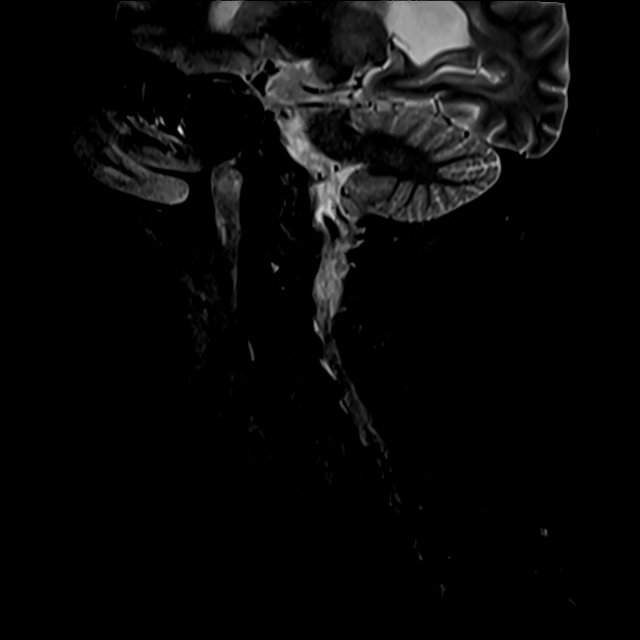
[im 12/15]
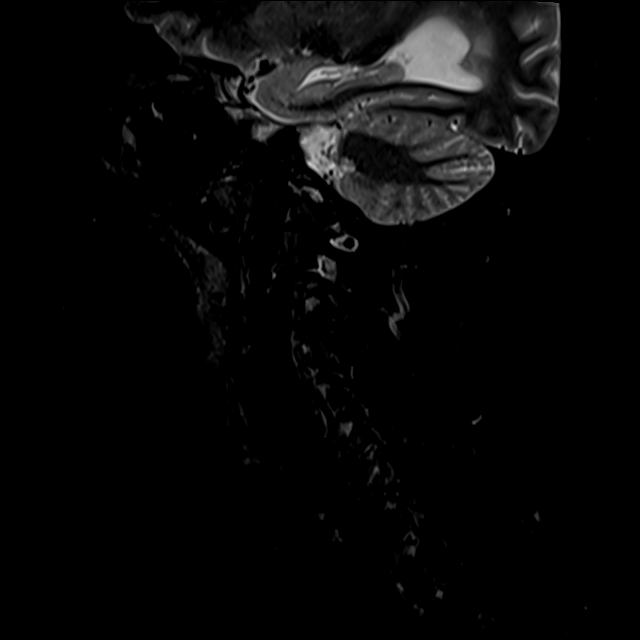

[Series 8: T2 · axial · 3.0mm · 0.50mm/px · z∈[-34,+56]mm · 8 of 30 slices shown (2 of 2)]
[im 1/30]
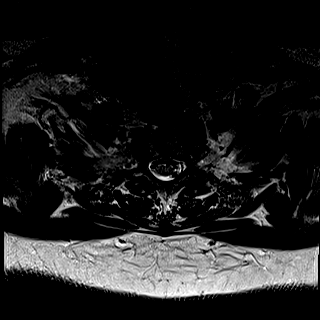
[im 5/30]
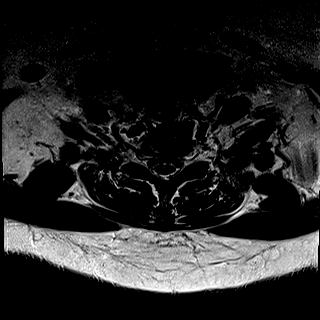
[im 9/30]
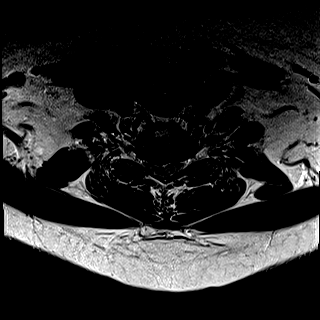
[im 14/30]
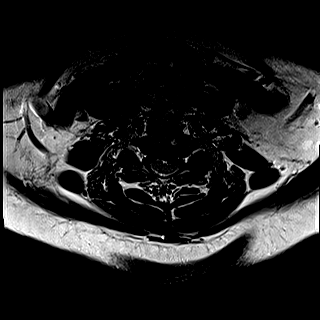
[im 16/30]
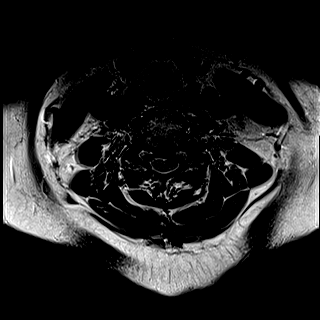
[im 21/30]
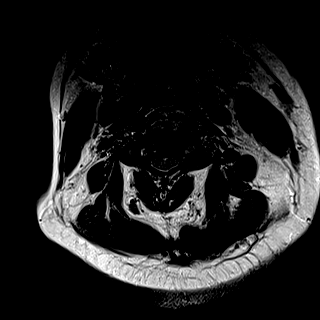
[im 25/30]
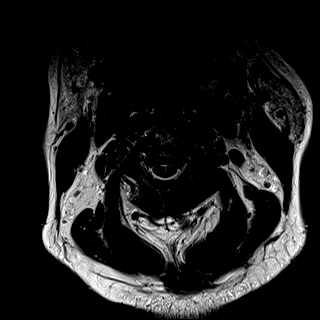
[im 30/30]
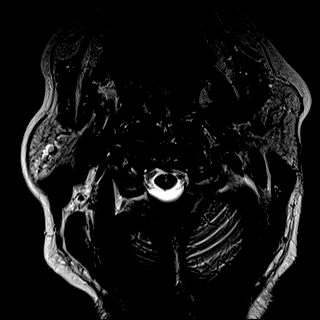

[27 of 48 positions shown; findings below may reference images not displayed]

FINDINGS: Alignment: Physiologic.

Vertebrae: No acute fracture, evidence of discitis, or bone lesion.

Cord: Focal punctate area of T2 hyperintense signal in the left side
of the cervical spinal cord at the level of C6-7 with which may
reflect a small area of myelomalacia secondary to disc disease
(image 24/series 8). Otherwise normal signal and morphology.

Posterior Fossa, vertebral arteries, paraspinal tissues: Posterior
fossa demonstrates no focal abnormality. Vertebral artery flow voids
are maintained. Paraspinal soft tissues are unremarkable.

Disc levels:

Discs: Ankylosis across the C5-6 and C6-7 vertebral bodies.
Degenerative disease with disc height loss at C2-3, C3-4, C4-5.
Bulky anterior bridging osteophytes from C2 through C7.

C2-3: Broad-based disc osteophyte complex with ossification of the
posterior longitudinal ligament deforming the ventral cervical
spinal cord. Mild left foraminal stenosis. No right foraminal
stenosis. Moderate central canal stenosis.

C3-4: Broad-based disc osteophyte complex with ossification of the
posterior longitudinal ligament. Mild spinal stenosis. Bilateral
uncovertebral degenerative changes. Severe bilateral foraminal
stenosis.

C4-5: Broad-based disc osteophyte complex and ossification of the
posterior longitudinal ligament. Uncovertebral degenerative changes
bilaterally. Severe right and moderate left foraminal stenosis. Mild
spinal stenosis.

C5-6: Ankylosis across the disc space with a focal left paracentral
disc osteophyte complex impressing upon the ventral left paracentral
cervical spinal cord. Bilateral uncovertebral degenerative changes.
Moderate-severe left foraminal stenosis. Moderate right foraminal
stenosis. Mild spinal stenosis.

C6-7: Ossification of the posterior longitudinal ligament. Mild
bilateral foraminal stenosis. No central canal stenosis.

C7-T1: No significant disc bulge. No neural foraminal stenosis. No
central canal stenosis.
IMPRESSION: 1. Diffuse cervical spine spondylosis as described above without
significant interval change compared with 12/12/2017.
2.  No acute osseous injury of the cervical spine.

## 2023-06-14 ENCOUNTER — Ambulatory Visit: Payer: 59 | Admitting: Podiatry

## 2023-06-24 IMAGING — MG MM DIGITAL SCREENING BILAT W/ TOMO AND CAD
6 of 12 series · 6 of 36 positions shown · non-contrast
Comparison: Previous exam(s).

CLINICAL DATA: Screening.

EXAM:
DIGITAL SCREENING BILATERAL MAMMOGRAM WITH TOMOSYNTHESIS AND CAD
TECHNIQUE: Bilateral screening digital craniocaudal and mediolateral oblique
mammograms were obtained. Bilateral screening digital breast
tomosynthesis was performed. The images were evaluated with
computer-aided detection.

[L CC synth-2D (1 of 2)]
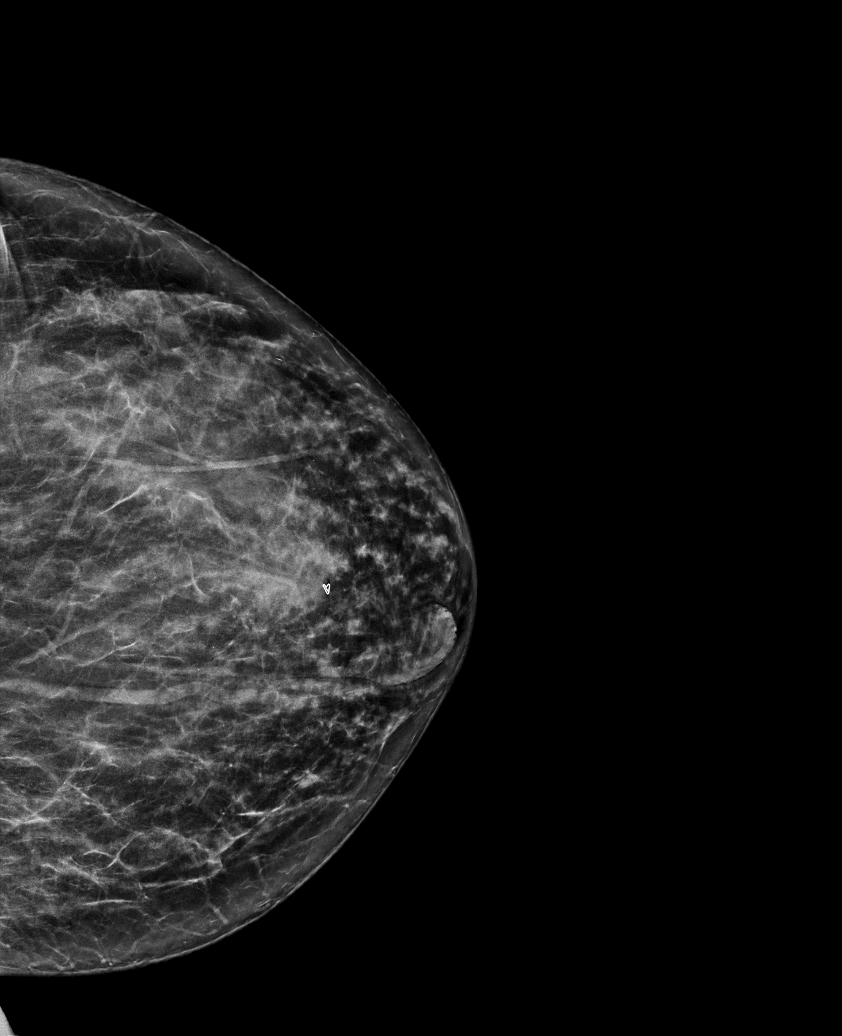

[R CC synth-2D (1 of 2)]
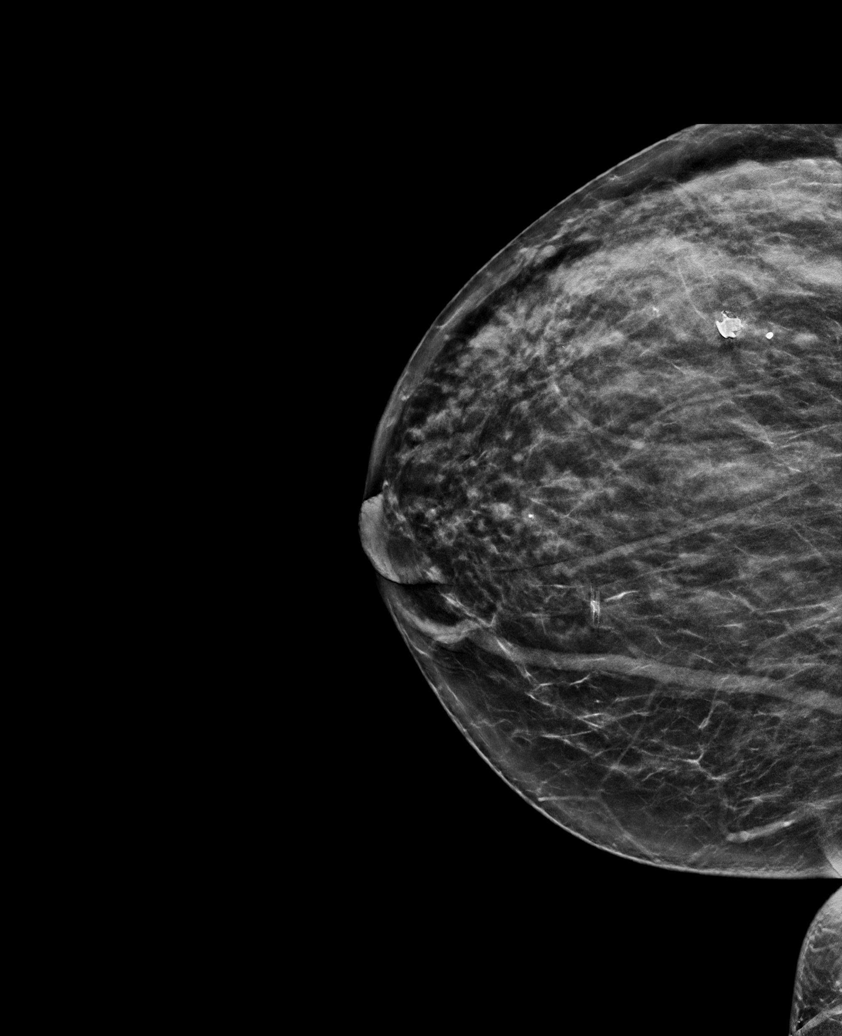

[R MLO synth-2D]
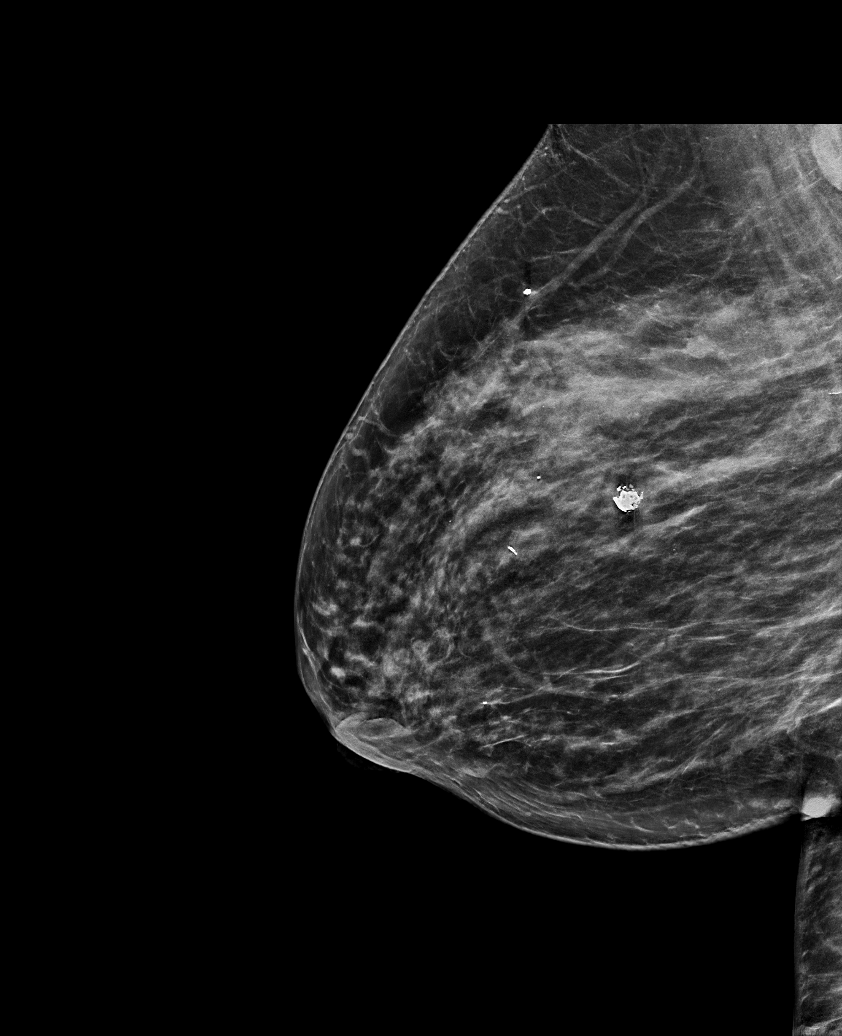

[R CC synth-2D (2 of 2)]
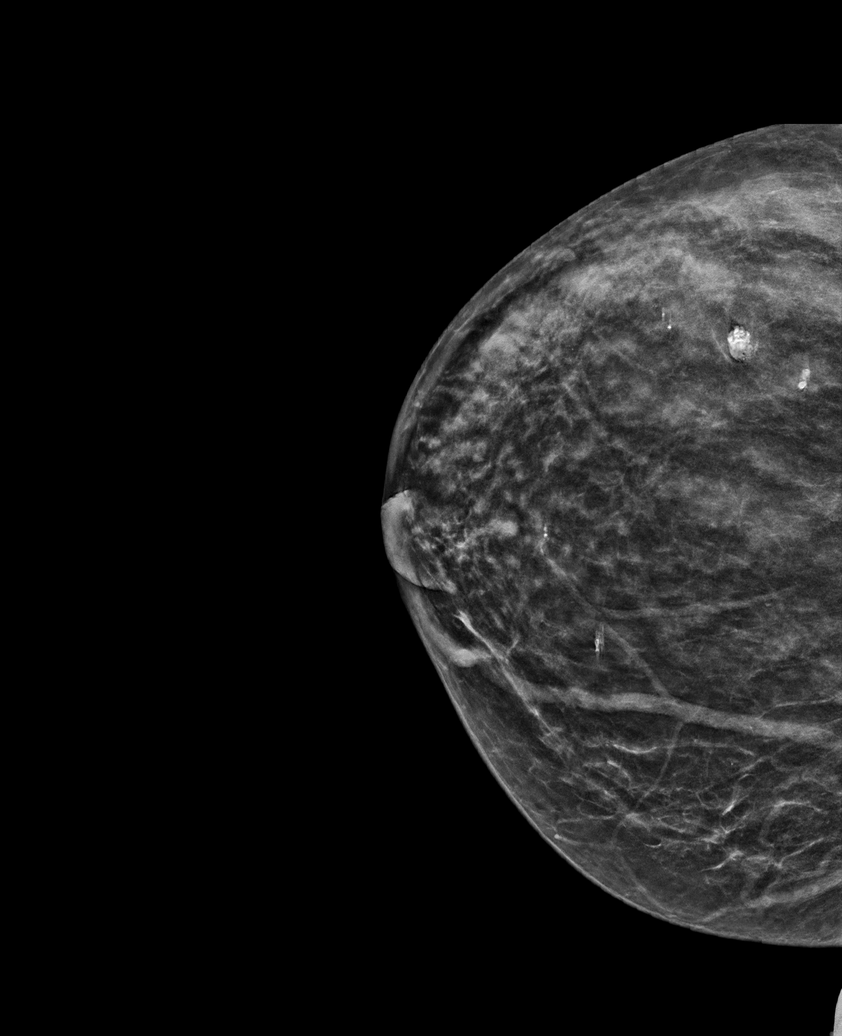

[L CC synth-2D (2 of 2)]
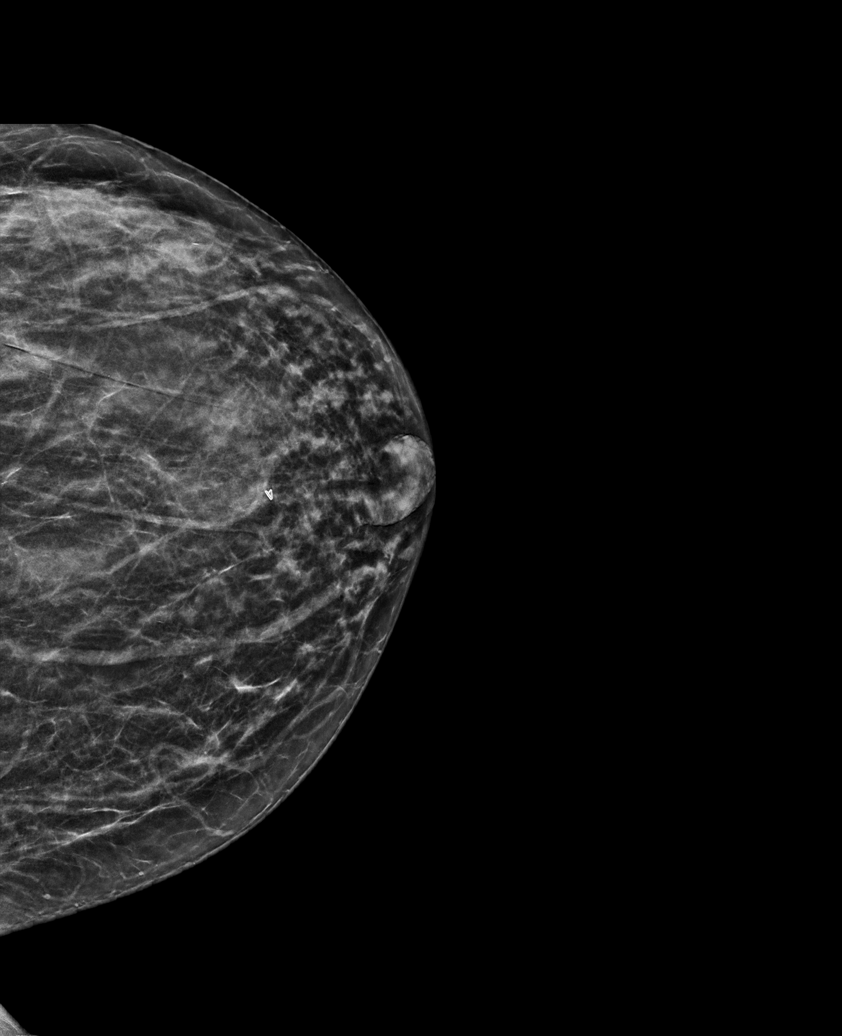

[L MLO synth-2D]
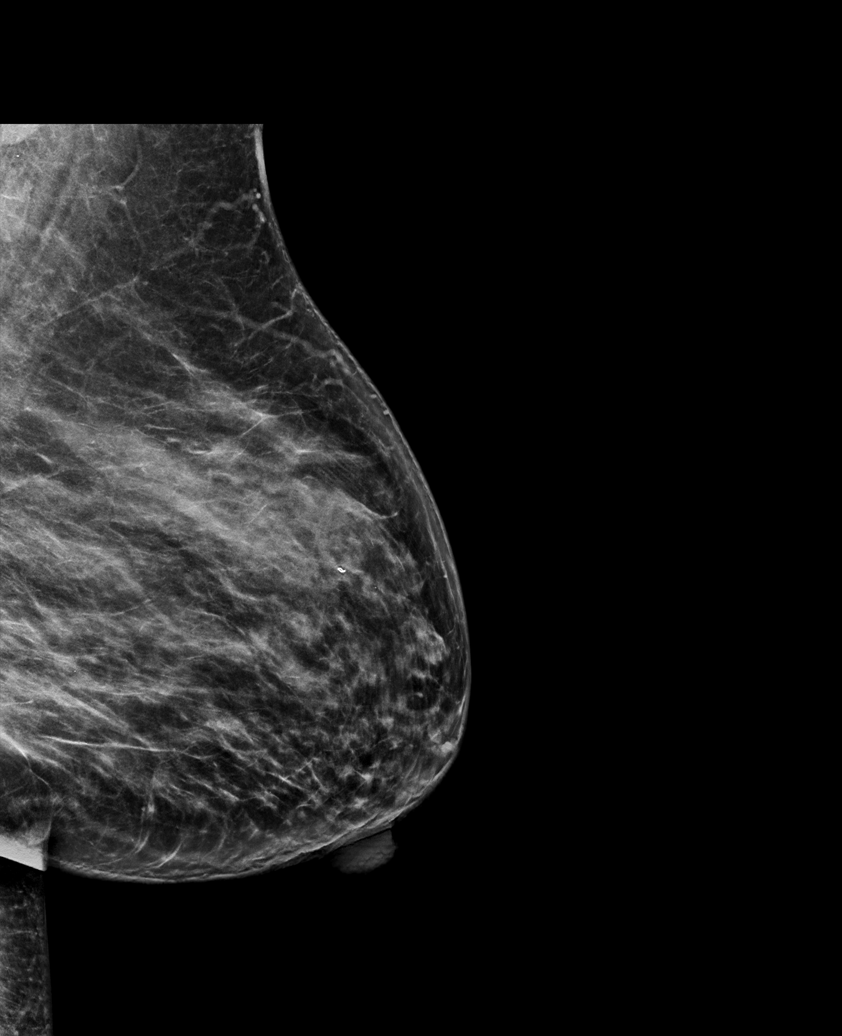

[6 of 36 positions shown; findings below may reference images not displayed]

ACR Breast Density Category c: The breast tissue is heterogeneously
dense, which may obscure small masses.
FINDINGS: In the right breast, a possible mass warrants further evaluation. In
the left breast, no findings suspicious for malignancy.
IMPRESSION: Further evaluation is suggested for a possible mass in the right
breast.

RECOMMENDATION:
Diagnostic mammogram and possibly ultrasound of the right breast.
(Code:82-G-AAO)

The patient will be contacted regarding the findings, and additional
imaging will be scheduled.

BI-RADS CATEGORY  0: Incomplete. Need additional imaging evaluation
and/or prior mammograms for comparison.

## 2023-06-27 ENCOUNTER — Telehealth: Payer: Self-pay | Admitting: Neurology

## 2023-06-27 NOTE — Telephone Encounter (Signed)
Pt called to verify appointment

## 2023-07-21 IMAGING — MG MM DIGITAL DIAGNOSTIC UNILAT*R* W/ TOMO W/ CAD
8 series · 8 of 24 positions shown · non-contrast
Comparison: Previous exam(s).

CLINICAL DATA: 60-year-old female presenting as a recall from
screening for possible right breast mass.

EXAM:
DIGITAL DIAGNOSTIC UNILATERAL RIGHT MAMMOGRAM WITH TOMOSYNTHESIS AND
CAD; ULTRASOUND RIGHT BREAST LIMITED
TECHNIQUE: Right digital diagnostic mammography and breast tomosynthesis was
performed. The images were evaluated with computer-aided detection.;
Targeted ultrasound examination of the right breast was performed

[R ML synth-2D (1 of 2)]
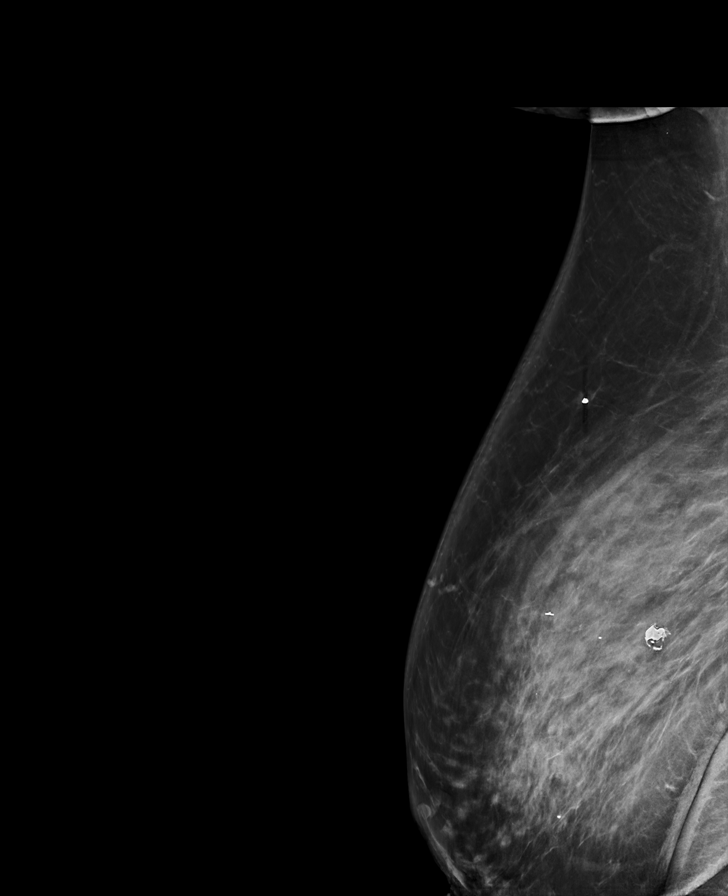

[R ML synth-2D (2 of 2)]
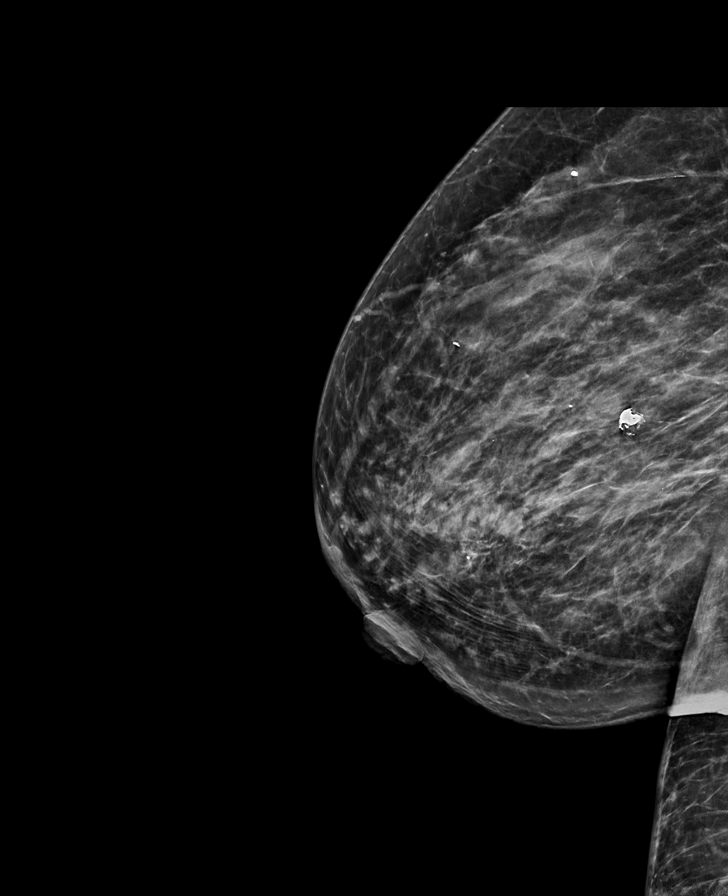

[R XCCL synth-2D]
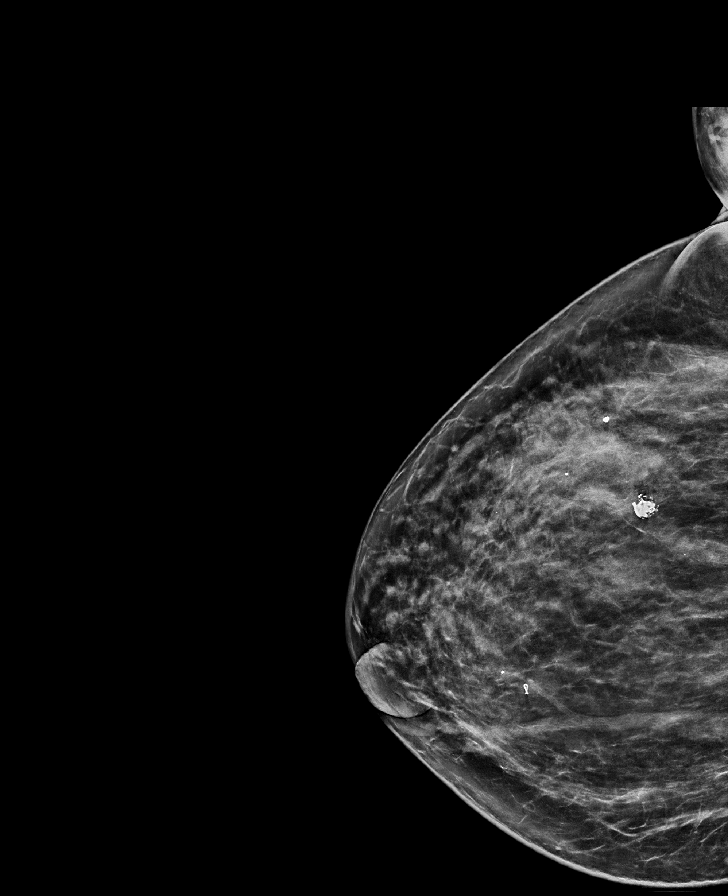

[R MLO synth-2D]
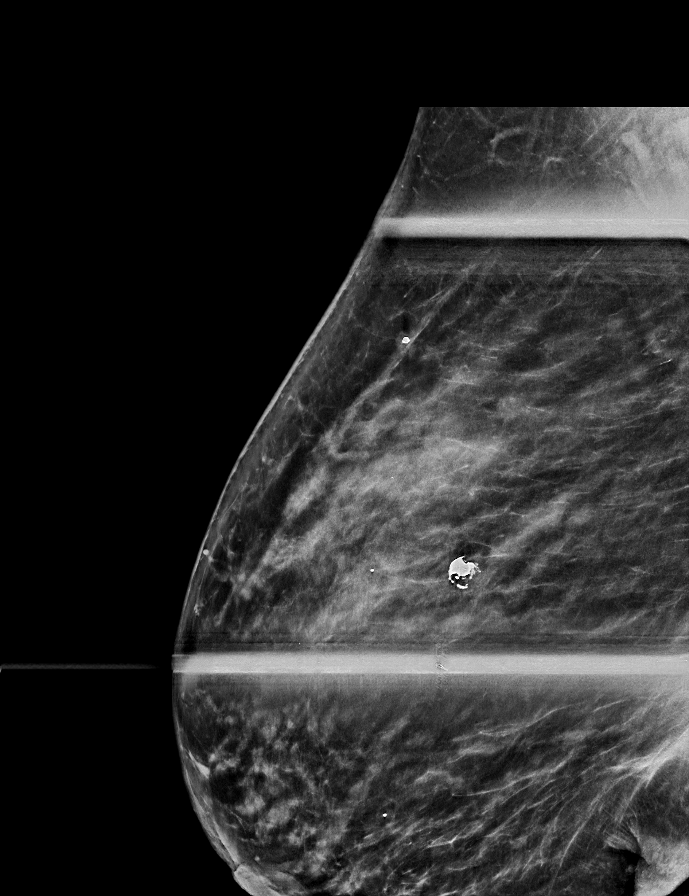

[R XCCL tomo · tomo slice 36/71.0]
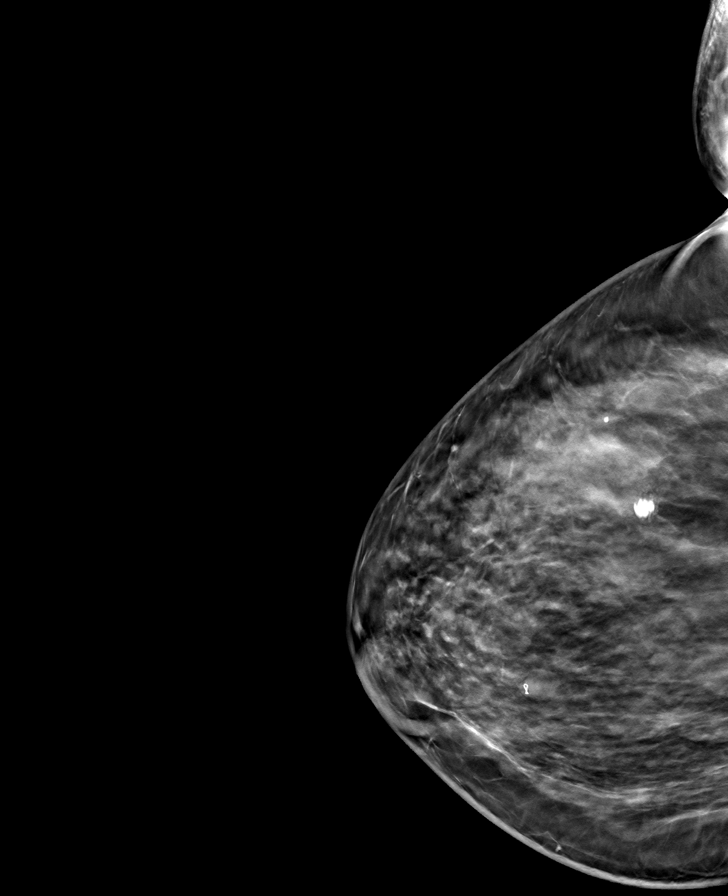

[R ML tomo (1 of 2) · tomo slice 47/92.0]
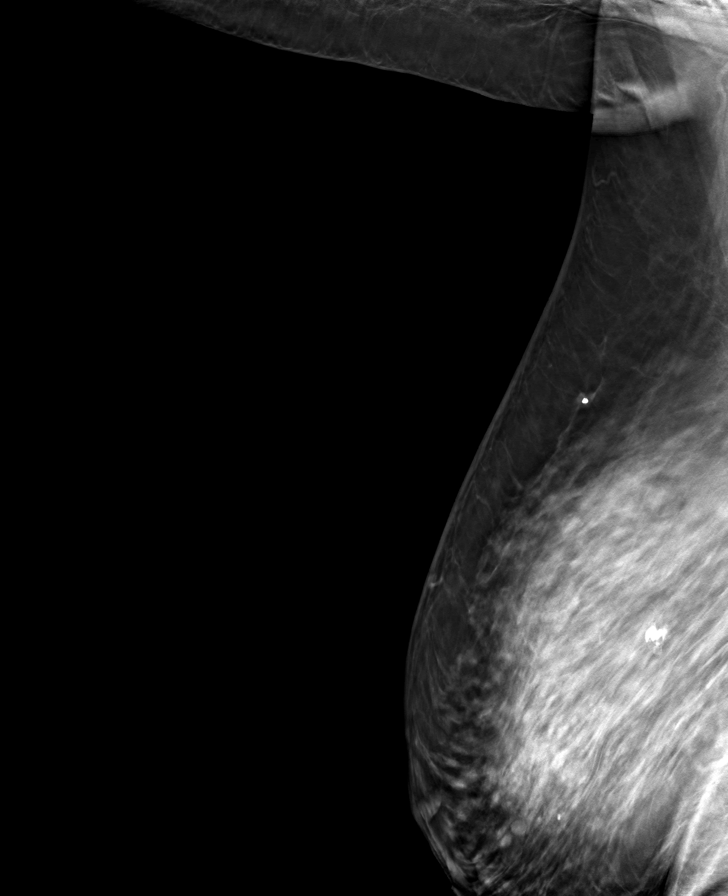

[R MLO tomo · tomo slice 43/85.0]
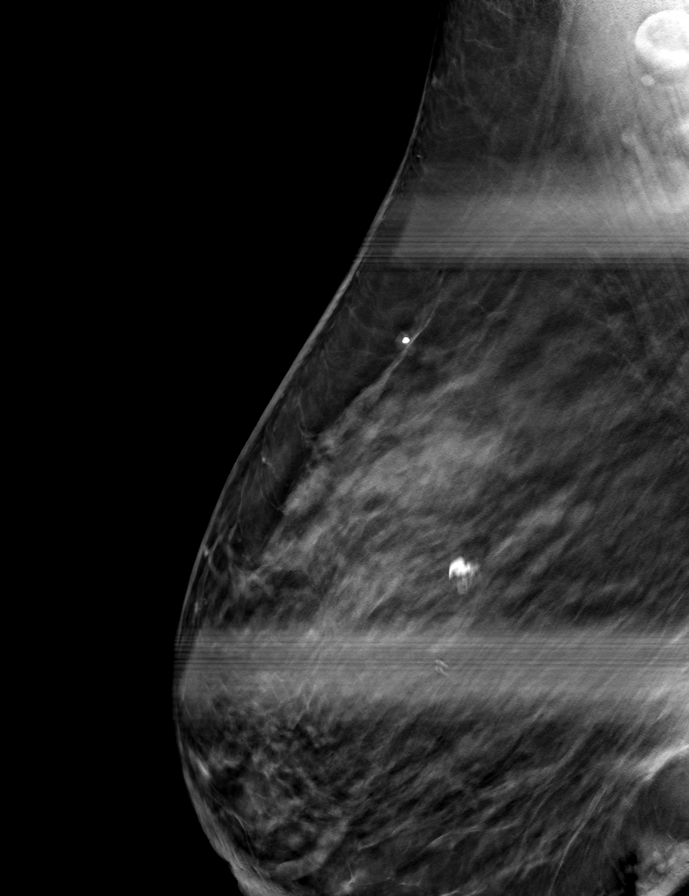

[R ML tomo (2 of 2) · tomo slice 34/67.0]
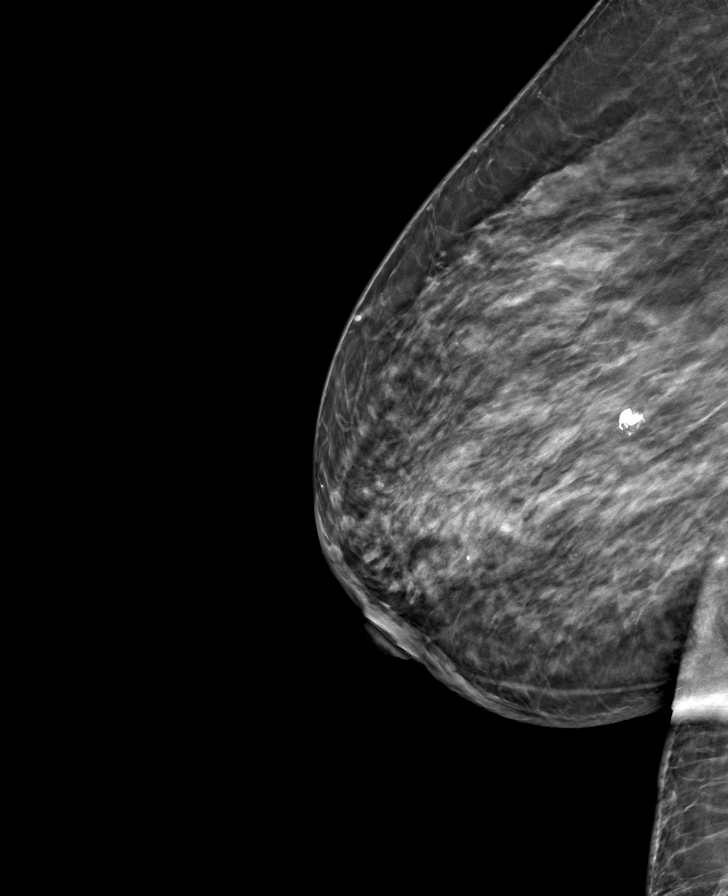

[8 of 24 positions shown; findings below may reference images not displayed]

ACR Breast Density Category c: The breast tissue is heterogeneously
dense, which may obscure small masses.
FINDINGS: Mammogram:

Right breast: Spot compression tomosynthesis MLO as well as full
field exaggerated cc and mL tomosynthesis views of the right breast
were performed. There is persistence of an obscured mass in the
upper outer right breast posterior depth measuring approximately
cm.

Ultrasound:

Targeted ultrasound performed in the right breast at 10 o'clock 12
cm from the nipple demonstrating a round circumscribed hypoechoic
mass measuring 0.5 x 0.6 x 0.5 cm, likely a complicated cyst. No
internal vascularity. This corresponds to the mammographic finding.
Incidentally noted nearby is a oval anechoic mass consistent with a
benign cyst.
IMPRESSION: Probably benign mass in the right breast at 10 o'clock, likely a
complicated cyst.

RECOMMENDATION:
Right breast ultrasound in 6 months.

I have discussed the findings and recommendations with the patient
who agrees to short-term follow-up. If applicable, a reminder letter
will be sent to the patient regarding the next appointment.

BI-RADS CATEGORY  3: Probably benign.

## 2023-07-25 ENCOUNTER — Encounter: Payer: Self-pay | Admitting: Family Medicine

## 2023-07-25 ENCOUNTER — Telehealth: Payer: Self-pay | Admitting: Family Medicine

## 2023-07-25 NOTE — Telephone Encounter (Signed)
 Unable to reach pt using both numbers in chart, sent letter in mail informing pt of need to reschedule 11/13/23 appt - NP out

## 2023-08-08 ENCOUNTER — Other Ambulatory Visit: Payer: Self-pay | Admitting: Family Medicine

## 2023-08-08 DIAGNOSIS — Z1231 Encounter for screening mammogram for malignant neoplasm of breast: Secondary | ICD-10-CM

## 2023-08-29 ENCOUNTER — Ambulatory Visit

## 2023-09-13 ENCOUNTER — Ambulatory Visit (INDEPENDENT_AMBULATORY_CARE_PROVIDER_SITE_OTHER): Payer: 59 | Admitting: Podiatry

## 2023-09-13 ENCOUNTER — Encounter: Payer: Self-pay | Admitting: Podiatry

## 2023-09-13 VITALS — BP 112/54

## 2023-09-13 DIAGNOSIS — E162 Hypoglycemia, unspecified: Secondary | ICD-10-CM | POA: Diagnosis not present

## 2023-09-13 DIAGNOSIS — B351 Tinea unguium: Secondary | ICD-10-CM | POA: Diagnosis not present

## 2023-09-13 DIAGNOSIS — M79675 Pain in left toe(s): Secondary | ICD-10-CM | POA: Diagnosis not present

## 2023-09-13 DIAGNOSIS — E1151 Type 2 diabetes mellitus with diabetic peripheral angiopathy without gangrene: Secondary | ICD-10-CM

## 2023-09-13 DIAGNOSIS — M79674 Pain in right toe(s): Secondary | ICD-10-CM

## 2023-09-13 NOTE — Progress Notes (Signed)
 Subjective:  Patient ID: Amy [name], female    DOB: Feb 21, 1961,  MRN: [id number]  Amy [name] presents to clinic today for at risk foot care with history of diabetic neuropathy and callus(es) right foot and painful mycotic toenails that are difficult to trim. Painful toenails interfere with ambulation. Aggravating factors include wearing enclosed shoe gear. Pain is relieved with periodic professional debridement. Painful calluses are aggravated when weightbearing with and without shoegear. Pain is relieved with periodic professional debridement.  Chief Complaint  Patient presents with   routine foot care    Rm 16/ diabetic/ IDDM/ last blood sugar 124/ Last A1C 7.1/ PCP Dr. Franklin Ito last visit 1 month ago   Patient states she took 8 units insulin  Lispro this morning without food.  Patient has Dexcom device: When I entered treatment room, patient had a ziploc bag of white grapes stating, "My sugar is low. I took my insulin  this morning and didn't eat breakfast. I had to catch SCAT to get to my appointment. I had a half a sandwich too, but it's still low."  She is accompanied by her Home Health Aide on today's visit. Initial Dexcom reading 47 mg/dl, then 55 mg/dl.  PCP is [facility].  Allergies  Allergen Reactions   Penicillins Hives and Swelling    Has patient had a PCN reaction causing immediate rash, facial/tongue/throat swelling, SOB or lightheadedness with hypotension: yes- face swelling Has patient had a PCN reaction causing severe rash involving mucus membranes or skin necrosis: no Has patient had a PCN reaction that required hospitalization unknown (childhood allergy) Has patient had a PCN reaction occurring within the last 10 years: no If all of the above answers are "NO", then may proceed with Cephalosporin use.    Tramadol  Nausea And Vomiting    Intense nausea   Vicodin [Hydrocodone -Acetaminophen ] Nausea And Vomiting   Codeine Nausea Only    Other  reaction(s): Unknown   Iodine-131 Hives   Iohexol Hives    Pt developed itching and hives along with nasal congestion; needs 13 hour premeds for future studies, Onset Date: [id number]    Lisinopril Cough    Other reaction(s): Unknown   Sulfa Antibiotics Nausea And Vomiting    Review of Systems: Negative except as noted in the HPI.  Objective:  Vitals:   09/13/23 949  BP: (!) 112/54  BP 112/60 sitting up at 10:15 am Pulse: 70 bpm by EMS staff O2 sat 9%% on room air by EMS staff  Blood glucose readings: 72 mg/dl at 2:44 am 86 mg/dl at 0:10 am 80 mg/dl at 27:25 am 80 mg/dl at 36:64 am  Blood glucose by EMS monitor: 113 mg/dl  Amy [name] is a pleasant 63 y.o. female obese in NAD. AAO x 3. Vascular Examination: CFT <3 seconds b/l. DPpulses faintly palpable b/l. PT pulses nonpalpable b/l. Skin temperature gradient warm to warm b/l. No pain with calf compression. No ischemia or gangrene. No cyanosis or clubbing noted b/l. Trace edema noted BLE.   Neurological Examination: After EMS left stand test done: unsteady on first attempt and seeing "spots". Second trial was successful. Sensation grossly intact b/l with 10 gram monofilament. Vibratory sensation intact b/l. Pt has subjective symptoms of neuropathy.  Dermatological Examination: Pedal skin warm and supple b/l. Toenails 1-5 b/l thick, discolored, elongated with subungual debris and pain on dorsal palpation.  No open wounds b/l LE. No interdigital macerations noted b/l LE.   Hyperkeratotic lesion(s) right heel resolved.    Musculoskeletal  Examination: Muscle strength 5/5 to b/l LE. HAV with bunion deformity noted b/l LE. Hammertoe deformity noted 2-5 b/l. Utilizes rollator for ambulation assistance, but taken to SCAT van via wheelchair with her Home Health Aide present.  Radiographs: None  Assessment/Plan: 1. Hypoglycemia   2. Pain due to onychomycosis of toenails of both feet   3. Type II diabetes mellitus with  peripheral circulatory disorder [facility])   -EMS called by me. I assisted patient giving glucose tablet after her grapes. Her aide gave her Smartie Candies. Patient remained conscious, alert and oriented at all times during the visit.  -Patient refused transport to hospital witnessed by EMS/clinic staff. Vitals and ECG taken by EMS. Patient educated by EMS staff and nursing on correct way to take insulin . Do not take without food. -Toenails 1-5 bilaterally debrided in length and girth with sterile nail nippers and dremel tool without incident. -Continue soft, supportive shoe gear daily -Report any pedal problems to medical professional immediately. -Patient/POA to contact office if there are any problems in the interim. -Patient taken to SCAT van via wheelchair with Home Health Aide accompanying her.   FOLLOW UP:  Return in about 3 months (around 12/13/2023).  Luella Sager, DPM      [place] LOCATION: [address], [place] [address]                   Office [phone]   [place] LOCATION: [address] [place], [place] [address] Office [phone]  Return in about 3 months (around 12/13/2023).  Luella Sager, DPM      [place] LOCATION: [address], [place] [address]                   Office [phone]   [facility] LOCATION: [address] [place], [place] [address] Office [phone]

## 2023-09-19 ENCOUNTER — Ambulatory Visit
Admission: RE | Admit: 2023-09-19 | Discharge: 2023-09-19 | Disposition: A | Discharge status: Home / Self Care | Source: Ambulatory Visit | Attending: Family Medicine

## 2023-09-19 DIAGNOSIS — Z1231 Encounter for screening mammogram for malignant neoplasm of breast: Secondary | ICD-10-CM

## 2023-09-20 ENCOUNTER — Emergency Department (HOSPITAL_COMMUNITY)
Admission: EM | Admit: 2023-09-20 | Discharge: 2023-09-21 | Disposition: A | Discharge status: Home / Self Care | Attending: Emergency Medicine | Admitting: Emergency Medicine

## 2023-09-20 DIAGNOSIS — E162 Hypoglycemia, unspecified: Secondary | ICD-10-CM

## 2023-09-20 DIAGNOSIS — Z794 Long term (current) use of insulin: Secondary | ICD-10-CM | POA: Diagnosis not present

## 2023-09-20 DIAGNOSIS — E11649 Type 2 diabetes mellitus with hypoglycemia without coma: Secondary | ICD-10-CM | POA: Insufficient documentation

## 2023-09-20 DIAGNOSIS — Z7984 Long term (current) use of oral hypoglycemic drugs: Secondary | ICD-10-CM | POA: Insufficient documentation

## 2023-09-20 DIAGNOSIS — I10 Essential (primary) hypertension: Secondary | ICD-10-CM | POA: Insufficient documentation

## 2023-09-20 DIAGNOSIS — Z79899 Other long term (current) drug therapy: Secondary | ICD-10-CM | POA: Insufficient documentation

## 2023-09-20 LAB — CBC WITH DIFFERENTIAL/PLATELET
Abs Immature Granulocytes: 0.03 10*3/uL (ref 0.00–0.07)
Basophils Absolute: 0 10*3/uL (ref 0.0–0.1)
Basophils Relative: 0 %
Eosinophils Absolute: 0.1 10*3/uL (ref 0.0–0.5)
Eosinophils Relative: 1 %
HCT: 40.6 % (ref 36.0–46.0)
Hemoglobin: 12.7 g/dL (ref 12.0–15.0)
Immature Granulocytes: 0 %
Lymphocytes Relative: 20 %
Lymphs Abs: 1.8 10*3/uL (ref 0.7–4.0)
MCH: 26 pg (ref 26.0–34.0)
MCHC: 31.3 g/dL (ref 30.0–36.0)
MCV: 83.2 fL (ref 80.0–100.0)
Monocytes Absolute: 0.8 10*3/uL (ref 0.1–1.0)
Monocytes Relative: 9 %
Neutro Abs: 6.2 10*3/uL (ref 1.7–7.7)
Neutrophils Relative %: 70 %
Platelets: 197 10*3/uL (ref 150–400)
RBC: 4.88 MIL/uL (ref 3.87–5.11)
RDW: 16.7 % — ABNORMAL HIGH (ref 11.5–15.5)
WBC: 9 10*3/uL (ref 4.0–10.5)
nRBC: 0 % (ref 0.0–0.2)

## 2023-09-20 LAB — BASIC METABOLIC PANEL WITH GFR
Anion gap: 8 (ref 5–15)
BUN: 28 mg/dL — ABNORMAL HIGH (ref 8–23)
CO2: 27 mmol/L (ref 22–32)
Calcium: 9.5 mg/dL (ref 8.9–10.3)
Chloride: 106 mmol/L (ref 98–111)
Creatinine, Ser: 1.28 mg/dL — ABNORMAL HIGH (ref 0.44–1.00)
GFR, Estimated: 47 mL/min — ABNORMAL LOW (ref >60)
Glucose, Bld: 102 mg/dL — ABNORMAL HIGH (ref 70–99)
Potassium: 4.5 mmol/L (ref 3.5–5.1)
Sodium: 141 mmol/L (ref 135–145)

## 2023-09-20 LAB — CBG MONITORING, ED
Glucose-Capillary: 90 mg/dL (ref 70–99)
Glucose-Capillary: 134 mg/dL — ABNORMAL HIGH (ref 70–99)

## 2023-09-20 MED ORDER — SODIUM CHLORIDE 0.9 % IV BOLUS
1000.0000 mL | Freq: Once | INTRAVENOUS | Status: AC
  Administered 2023-09-20: 1000 mL via INTRAVENOUS

## 2023-09-20 MED ORDER — GLUCOSE 40 % PO GEL
1.0000 | Freq: Once | ORAL | Status: AC
  Administered 2023-09-20: 31 g via ORAL
  Filled 2023-09-20: qty 1

## 2023-09-20 NOTE — ED Provider Notes (Signed)
 [place] EMERGENCY DEPARTMENT AT [facility] Provider Note   CSN: [id number] Arrival date & time: 09/20/23  2139     History  Chief Complaint  Patient presents with   Hypoglycemia    Amy [name] is a 63 y.o. female history of diabetes, hypertension, here presenting with hypoglycemia.  Patient was eating dinner around 6 PM and did give herself a shot of insulin .  She states that shortly after dinner she felt very lightheaded and dizzy.  She was concerned that her sugar was low and she has a Dexcom that showed sugar of 50.  Denies any fevers or chills or vomiting.  Patient has a history of hypoglycemia  when she does not eat enough food.  The history is provided by the patient.       Home Medications Prior to Admission medications   Medication Sig Start Date End Date Taking? Authorizing Provider  albuterol  (PROVENTIL  HFA;VENTOLIN  HFA) 108 (90 BASE) MCG/ACT inhaler Inhale 2 puffs into the lungs every 4 (four) hours as needed for wheezing or shortness of breath. For short    [provider]  allopurinol (ZYLOPRIM) 300 MG tablet Take 300 mg by mouth 2 (two) times daily. 11/23/20   [provider]  aspirin  EC 81 MG tablet Take 81 mg by mouth daily.    [provider]  blood glucose meter kit and supplies Dispense based on patient and insurance preference. Use up to four times daily as directed. (FOR ICD-10 E10.9, E11.9). 08/06/20   Benjiman Bras, MD  celecoxib (CELEBREX) 200 MG capsule celecoxib 200 mg capsule TK 1 C PO BID 02/14/19   [provider]  cetirizine (ZYRTEC) 10 MG tablet Take 10 mg by mouth daily.    [provider]  clotrimazole -betamethasone  (LOTRISONE ) cream Apply 1 application topically 2 (two) times daily. 07/08/21   McDonald, Olive Better, DPM  colchicine 0.6 MG tablet Take by mouth. 12/14/20   [provider]  Continuous Blood Gluc Sensor (DEXCOM G7 SENSOR) MISC 1 Device by Does not apply route as directed.  10/19/21   Shamleffer, Ibtehal Jaralla, MD  dapagliflozin  propanediol (FARXIGA ) 10 MG TABS tablet Take 1 tablet (10 mg total) by mouth daily. 03/23/23   Shamleffer, Ibtehal Jaralla, MD  docusate sodium  (COLACE) 100 MG capsule Take 100 mg by mouth 2 (two) times daily. 10/26/22   [provider]  Dulaglutide  (TRULICITY ) 3 MG/0.5ML SOAJ Inject 3 mg as directed once a week. 03/23/23   Shamleffer, Ibtehal Jaralla, MD  EMBRACE TALK GLUCOSE TEST test strip USE TO CHECK BLOOD SUGARS FOUR TIMES A DAY BEFORE MEALS AND AT BEDTIME DX E11.29 07/29/20   Benjiman Bras, MD  esomeprazole  (NEXIUM ) 40 MG capsule Take 1 capsule (40 mg total) by mouth daily. 07/10/20   Bensimhon, Rheta Celestine, MD  fluticasone  (FLONASE ) 50 MCG/ACT nasal spray Place 2 sprays into both nostrils daily. 12/16/22   [provider]  gabapentin  (NEURONTIN ) 400 MG capsule Take 400 mg by mouth 3 (three) times daily. 08/10/21   [provider]  hydrOXYzine  (ATARAX /VISTARIL ) 25 MG tablet Take 0.5 tablets (12.5 mg total) by mouth every 8 (eight) hours as needed for itching. 12/05/20   Adolph Hoop, PA-C  insulin  degludec (TRESIBA  FLEXTOUCH) 200 UNIT/ML FlexTouch Pen Inject 8 Units into the skin daily at 6 (six) AM. 03/23/23   Shamleffer, Ibtehal Jaralla, MD  insulin  lispro (HUMALOG ) 100 UNIT/ML KwikPen MAX DAILY 30 UNITS 03/23/23   Shamleffer, Ibtehal Jaralla, MD  Insulin  Pen Needle  32G X 4 MM MISC 1 Device by Does not apply route in the morning, at noon, in the evening, and at bedtime. 03/23/23   Shamleffer, Ibtehal Jaralla, MD  levofloxacin  (LEVAQUIN ) 750 MG tablet Take 750 mg by mouth daily. 03/09/23   [provider]  levothyroxine  (SYNTHROID ) 150 MCG tablet Take 1 tablet (150 mcg total) by mouth daily. Needs appt for future refills 08/06/20   Benjiman Bras, MD  linaclotide  (LINZESS ) 145 MCG CAPS capsule Take 145 mcg by mouth daily as needed (constipation).    [provider]  losartan (COZAAR) 25 MG tablet Take 25  mg by mouth daily. 02/07/23   [provider]  naproxen  (NAPROSYN ) 500 MG tablet Take 500 mg by mouth 2 (two) times daily with a meal.    [provider]  nitrofurantoin , macrocrystal-monohydrate, (MACROBID ) 100 MG capsule Take 1 capsule (100 mg total) by mouth 2 (two) times daily. 11/06/21   Gretel Leaven, PA-C  olmesartan (BENICAR) 20 MG tablet Take 10 mg by mouth daily. 04/26/21   [provider]  olmesartan (BENICAR) 40 MG tablet Take 40 mg by mouth daily. 02/01/23   [provider]  ondansetron  (ZOFRAN  ODT) 4 MG disintegrating tablet Take 1 tablet (4 mg total) by mouth every 8 (eight) hours as needed for nausea or vomiting. 07/09/19   Carleton Cheek, PA-C  potassium chloride  SA (KLOR-CON ) 20 MEQ tablet Take 1 tablet (20 mEq total) by mouth daily. Need appt for future refills 08/06/20   Benjiman Bras, MD  Prodigy Twist Top Lancets 28G MISC USE TO CHECK BLOOD SUGARS 10/02/20   [provider]  simvastatin  (ZOCOR ) 20 MG tablet Take 1 tablet (20 mg total) by mouth every evening. 08/06/20   Benjiman Bras, MD  spironolactone  (ALDACTONE ) 25 MG tablet Take 0.5 tablets (12.5 mg total) by mouth daily. Needs appt for future refills 08/06/20   Benjiman Bras, MD  tiZANidine (ZANAFLEX) 2 MG tablet Take 2 mg by mouth 2 (two) times daily. 10/02/20   [provider]  torsemide  (DEMADEX ) 20 MG tablet Take 4 tablets (80 mg total) by mouth daily. Need appt for future refills 08/06/20   Benjiman Bras, MD      Allergies    Penicillins, Tramadol , Vicodin [hydrocodone -acetaminophen ], Codeine, Iodine-131, Iohexol, Lisinopril, and Sulfa antibiotics    Review of Systems   Review of Systems  Neurological:  Positive for weakness.  All other systems reviewed and are negative.   Physical Exam Updated Vital Signs BP (!) 169/68 (BP Location: Left Arm)   Pulse 70   Temp 97.7 F (36.5 C) (Oral)   Resp 20   SpO2 100%  Physical Exam Vitals and nursing note  reviewed.  Constitutional:      Comments: Slightly dehydrated  HENT:     Head: Normocephalic.     Nose: Nose normal.     Mouth/Throat:     Mouth: Mucous membranes are moist.  Eyes:     Extraocular Movements: Extraocular movements intact.     Pupils: Pupils are equal, round, and reactive to light.  Cardiovascular:     Rate and Rhythm: Normal rate and regular rhythm.     Pulses: Normal pulses.     Heart sounds: Normal heart sounds.  Pulmonary:     Effort: Pulmonary effort is normal.     Breath sounds: Normal breath sounds.  Abdominal:     General: Abdomen is flat.     Palpations: Abdomen is soft.  Musculoskeletal:  General: Normal range of motion.     Cervical back: Normal range of motion and neck supple.  Skin:    General: Skin is warm.     Capillary Refill: Capillary refill takes less than 2 seconds.  Neurological:     General: No focal deficit present.     Mental Status: She is alert.  Psychiatric:        Mood and Affect: Mood normal.        Behavior: Behavior normal.     ED Results / Procedures / Treatments   Labs (all labs ordered are listed, but only abnormal results are displayed) Labs Reviewed  CBC WITH DIFFERENTIAL/PLATELET - Abnormal; Notable for the following components:      Result Value   RDW 16.7 (*)    All other components within normal limits  BASIC METABOLIC PANEL WITH GFR - Abnormal; Notable for the following components:   Glucose, Bld 102 (*)    BUN 28 (*)    Creatinine, Ser 1.28 (*)    GFR, Estimated 47 (*)    All other components within normal limits  CBG MONITORING, ED - Abnormal; Notable for the following components:   Glucose-Capillary 134 (*)    All other components within normal limits  CBG MONITORING, ED    EKG None  Radiology MM 3D SCREENING MAMMOGRAM BILATERAL BREAST Result Date: 09/20/2023 CLINICAL DATA:  Screening. EXAM: DIGITAL SCREENING BILATERAL MAMMOGRAM WITH TOMOSYNTHESIS AND CAD TECHNIQUE: Bilateral screening digital  craniocaudal and mediolateral oblique mammograms were obtained. Bilateral screening digital breast tomosynthesis was performed. The images were evaluated with computer-aided detection. COMPARISON:  Previous exam(s). ACR Breast Density Category c: The breasts are heterogeneously dense, which may obscure small masses. FINDINGS: There are no findings suspicious for malignancy. IMPRESSION: No mammographic evidence of malignancy. A result letter of this screening mammogram will be mailed directly to the patient. RECOMMENDATION: Screening mammogram in one year. (Code:[id number]-B-01Y) BI-RADS CATEGORY  1: Negative. Electronically Signed   By: Dru Georges M.D.   On: 09/20/2023 15:41    Procedures Procedures    Medications Ordered in ED Medications  dextrose  (GLUTOSE) oral gel 40% (peds > 20kg and adults) (31 g Oral Given 09/20/23 2202)  sodium chloride  0.9 % bolus 1,000 mL (1,000 mLs Intravenous New Bag/Given 09/20/23 2203)    ED Course/ Medical Decision Making/ A&P                                 Medical Decision Making Amy [name] is a 63 y.o. female here presenting with hypoglycemia.  Patient is diabetic and gave herself insulin  shot and did not eat much food.  I think hypoglycemia likely from decreased p.o. intake.  Will check electrolytes and give food and oral glucose.  11:04 PM Glucose was initially 90 and went up to 134.  CBC and BMP unremarkable.  Patient ate a sandwich and felt better.  Patient has a Dexcom and has endocrinology follow-up tomorrow  Problems Addressed: Hypoglycemia: acute illness or injury  Amount and/or Complexity of Data Reviewed Labs: ordered. Decision-making details documented in ED Course.  Risk OTC drugs.    Final Clinical Impression(s) / ED Diagnoses Final diagnoses:  None    Rx / DC Orders ED Discharge Orders     None         Dalene Duck, MD 09/20/23 2305

## 2023-09-20 NOTE — ED Triage Notes (Signed)
 BIBA from home- called EMS because her glucose was 50. CBG was 67 on arrival, as pt ate some chocolate. Pt c/o lightheadedness at this time.

## 2023-09-20 NOTE — Discharge Instructions (Signed)
 Your sugar was low and I recommend that you eat normally and follow-up with your endocrinologist tomorrow  Please hold off on your nighttime dose of insulin  tonight  Return to ER if your glucose is less than 50 or greater than 500 or you have fever or vomiting

## 2023-09-20 NOTE — ED Notes (Signed)
 Writer called [facility] for transport home.

## 2023-09-21 ENCOUNTER — Encounter: Payer: Self-pay | Admitting: Internal Medicine

## 2023-09-21 ENCOUNTER — Other Ambulatory Visit: Payer: Self-pay

## 2023-09-21 ENCOUNTER — Telehealth: Payer: 59 | Admitting: Internal Medicine

## 2023-09-21 VITALS — Ht 59.0 in

## 2023-09-21 DIAGNOSIS — E11319 Type 2 diabetes mellitus with unspecified diabetic retinopathy without macular edema: Secondary | ICD-10-CM

## 2023-09-21 DIAGNOSIS — E1142 Type 2 diabetes mellitus with diabetic polyneuropathy: Secondary | ICD-10-CM | POA: Diagnosis not present

## 2023-09-21 DIAGNOSIS — Z794 Long term (current) use of insulin: Secondary | ICD-10-CM | POA: Diagnosis not present

## 2023-09-21 MED ORDER — INSULIN PEN NEEDLE 32G X 4 MM MISC: 1.0000 | Freq: Four times a day (QID) | 3 refills | Status: AC

## 2023-09-21 MED ORDER — TRULICITY 3 MG/0.5ML ~~LOC~~ SOAJ: 3.0000 mg | SUBCUTANEOUS | 3 refills | Status: AC

## 2023-09-21 MED ORDER — DAPAGLIFLOZIN PROPANEDIOL 10 MG PO TABS: 10.0000 mg | ORAL_TABLET | Freq: Every day | ORAL | 3 refills | Status: AC

## 2023-09-21 MED ORDER — ONETOUCH VERIO VI STRP: ORAL_STRIP | 12 refills | Status: AC

## 2023-09-21 MED ORDER — PRODIGY AUTOCODE BLOOD GLUCOSE W/DEVICE KIT: 1.0000 | PACK | Freq: Every day | 0 refills | Status: AC

## 2023-09-21 MED ORDER — INSULIN LISPRO (1 UNIT DIAL) 100 UNIT/ML (KWIKPEN): PEN_INJECTOR | SUBCUTANEOUS | 3 refills | Status: AC

## 2023-09-21 NOTE — Progress Notes (Signed)
 Virtual Visit via Video Note  I connected with Amy [name] on 09/21/23 at 10:50 AM EDT by a video enabled telemedicine application and verified that I am speaking with the correct person using two identifiers.   I discussed the limitations of evaluation and management by telemedicine and the availability of in person appointments. The Amy [name] expressed understanding and agreed to proceed.   -Location of the Amy [name] : home -Location of the provider : Office -The names of all persons participating in the telemedicine service : Pt and myself        Name: Amy [name]  Age/ Sex: 63 y.o., female   MRN/ DOB: [id number], 07-27-1960     PCP: [facility]   Reason for Endocrinology Evaluation: Type 2 Diabetes Mellitus  Initial Endocrine Consultative Visit: 06/29/2020    Amy [name] IDENTIFIER: Amy [name] is a 63 y.o. female with a past medical history of T2DM, Hypothyroidism and Dyslipidemia. The Amy [name] has followed with Endocrinology clinic since 06/29/2020 for consultative assistance with management of her diabetes.  DIABETIC HISTORY:  Amy [name] was diagnosed with T2DM in her 25's. Her hemoglobin A1c has ranged from 9.9% in 2021, peaking at 12.3% in 2014.   She was started on Farxiga  by a provider, Amy [name] does not recall who started her on it  SUBJECTIVE:      Today (09/21/2023): Amy [name] is here for a follow up on diabetes management. She is accompanied by her assistant. She checks glucose multiple times daily through dexcom.    The Amy [name] had an episode of hypoglycemia at her podiatrist office, it appears that she took insulin  without eating She also presented to the ED yesterday with hypoglycemic episode, which happened while eating dinner and after giving herself Humalog  she had a BG reading of 50 on the Dexcom, Amy [name] is symptomatic with these episodes  She follows with nephrology Dr. Britta Candy  She had a follow-up with podiatry  05/07/2023  Denies nausea or vomiting  Has chronic constipation and uses miralax  daily   HOME DIABETES REGIMEN:  Tresiba  8 units daily Humalog  6 units TIDQAC  Trulicity  3 mg weekly ( Friday )  Farxiga  10 mg daily    Statin: yes ACE-I/ARB: no  CONTINUOUS GLUCOSE MONITORING RECORD INTERPRETATION    Dates of Recording: 4/25-09/21/2023  Sensor description:dexcom  Results statistics:   CGM use % of time 92  Average and SD 155/56  Time in range 68  %  % Time Above 180 25  % Time above 250 6  % Time Below target 1   Glycemic patterns summary: BGs are optimal overnight, and fluctuate during the day  Hyperglycemic episodes postprandial  Hypoglycemic episodes occurred during the day  Overnight periods: mostly optimal     DIABETIC COMPLICATIONS: Microvascular complications:   Denies: CKD III, neuropathy.  Left prosthetic eye. Blind right eye  Last Eye Exam: Completed 04/2020 ( Dr. Seward Dao)    Macrovascular complications:   Denies: CAD, CVA, PVD   HISTORY:  Past Medical History:  Past Medical History:  Diagnosis Date   Arthritis    Asthma    Blind    Blindness and low vision    left eye glass eye,  legally blind in right eye   Diabetes mellitus    Diabetic neuropathy (HCC)    Hyperlipidemia    Hypertension    Hypothyroidism    Intractable nausea and vomiting 03/06/2018   Spinal stenosis    Past Surgical History:  Past Surgical History:  Procedure Laterality Date   ABDOMINAL HYSTERECTOMY  1990   BREAST BIOPSY Left    BREAST BIOPSY Right    BREAST BIOPSY Right 08/19/2022   US  RT BREAST BX W LOC DEV 1ST LESION IMG BX SPEC US  GUIDE 08/19/2022 [facility]   CESAREAN SECTION  1987, 1990   x 2   CHOLECYSTECTOMY  2008   COLONOSCOPY WITH PROPOFOL  N/A 05/05/2023   Procedure: COLONOSCOPY WITH PROPOFOL ;  Surgeon: Alvis Jourdain, MD;  Location: [facility];  Service: Gastroenterology;  Laterality: N/A;   ENUCLEATION Bilateral 09/15/1998   EYE SURGERY  2016    fitting artificial eye   POLYPECTOMY  05/05/2023   Procedure: POLYPECTOMY;  Surgeon: Alvis Jourdain, MD;  Location: [facility];  Service: Gastroenterology;;   Social History:  reports that she quit smoking about 31 years ago. Her smoking use included cigarettes. She has never used smokeless tobacco. She reports that she does not drink alcohol and does not use drugs. Family History:  Family History  Problem Relation Age of Onset   Diabetes Sister    Glaucoma Sister    Hypertension Sister    Healthy Mother    Healthy Father    Stroke Brother    Heart attack Paternal Aunt    Breast cancer Neg Hx      HOME MEDICATIONS: Allergies as of 09/21/2023       Reactions   Penicillins Hives, Swelling   Has Amy [name] had a PCN reaction causing immediate rash, facial/tongue/throat swelling, SOB or lightheadedness with hypotension: yes- face swelling Has Amy [name] had a PCN reaction causing severe rash involving mucus membranes or skin necrosis: no Has Amy [name] had a PCN reaction that required hospitalization unknown (childhood allergy) Has Amy [name] had a PCN reaction occurring within the last 10 years: no If all of the above answers are "NO", then may proceed with Cephalosporin use.   Tramadol  Nausea And Vomiting   Intense nausea   Vicodin [hydrocodone -acetaminophen ] Nausea And Vomiting   Codeine Nausea Only   Other reaction(s): Unknown   Iodine-131 Hives   Iohexol Hives   Pt developed itching and hives along with nasal congestion; needs 13 hour premeds for future studies, Onset Date: [id number]   Lisinopril Cough   Other reaction(s): Unknown   Sulfa Antibiotics Nausea And Vomiting        Medication List        Accurate as of Sep 21, 2023  7:16 AM. If you have any questions, ask your nurse or doctor.          albuterol  108 (90 Base) MCG/ACT inhaler Commonly known as: VENTOLIN  HFA Inhale 2 puffs into the lungs every 4 (four) hours as needed for wheezing or shortness of breath. For  short   allopurinol 300 MG tablet Commonly known as: ZYLOPRIM Take 300 mg by mouth 2 (two) times daily.   aspirin  EC 81 MG tablet Take 81 mg by mouth daily.   blood glucose meter kit and supplies Dispense based on Amy [name] and insurance preference. Use up to four times daily as directed. (FOR ICD-10 E10.9, E11.9).   celecoxib 200 MG capsule Commonly known as: CELEBREX celecoxib 200 mg capsule TK 1 C PO BID   cetirizine 10 MG tablet Commonly known as: ZYRTEC Take 10 mg by mouth daily.   clotrimazole -betamethasone  cream Commonly known as: Lotrisone  Apply 1 application topically 2 (two) times daily.   colchicine 0.6 MG tablet Take by mouth.   dapagliflozin  propanediol 10 MG Tabs tablet Commonly known as: Farxiga  Take 1  tablet (10 mg total) by mouth daily.   Dexcom G7 Sensor Misc 1 Device by Does not apply route as directed.   docusate sodium  100 MG capsule Commonly known as: COLACE Take 100 mg by mouth 2 (two) times daily.   Embrace Talk Glucose Test test strip Generic drug: glucose blood USE TO CHECK BLOOD SUGARS FOUR TIMES A DAY BEFORE MEALS AND AT BEDTIME DX E11.29   esomeprazole  40 MG capsule Commonly known as: NEXIUM  Take 1 capsule (40 mg total) by mouth daily.   fluticasone  50 MCG/ACT nasal spray Commonly known as: FLONASE  Place 2 sprays into both nostrils daily.   gabapentin  400 MG capsule Commonly known as: NEURONTIN  Take 400 mg by mouth 3 (three) times daily.   hydrochlorothiazide 12.5 MG capsule Commonly known as: MICROZIDE Take 12.5 mg by mouth.   hydrOXYzine  25 MG tablet Commonly known as: ATARAX  Take 0.5 tablets (12.5 mg total) by mouth every 8 (eight) hours as needed for itching.   insulin  lispro 100 UNIT/ML KwikPen Commonly known as: HUMALOG  MAX DAILY 30 UNITS   Insulin  Pen Needle 32G X 4 MM Misc 1 Device by Does not apply route in the morning, at noon, in the evening, and at bedtime.   levofloxacin  750 MG tablet Commonly known as:  LEVAQUIN  Take 750 mg by mouth daily.   levothyroxine  150 MCG tablet Commonly known as: SYNTHROID  Take 1 tablet (150 mcg total) by mouth daily. Needs appt for future refills   linaclotide  145 MCG Caps capsule Commonly known as: LINZESS  Take 145 mcg by mouth daily as needed (constipation).   losartan 25 MG tablet Commonly known as: COZAAR Take 25 mg by mouth daily.   naproxen  500 MG tablet Commonly known as: NAPROSYN  Take 500 mg by mouth 2 (two) times daily with a meal.   nitrofurantoin  (macrocrystal-monohydrate) 100 MG capsule Commonly known as: MACROBID  Take 1 capsule (100 mg total) by mouth 2 (two) times daily.   olmesartan 20 MG tablet Commonly known as: BENICAR Take 10 mg by mouth daily.   olmesartan 40 MG tablet Commonly known as: BENICAR Take 40 mg by mouth daily.   ondansetron  4 MG disintegrating tablet Commonly known as: Zofran  ODT Take 1 tablet (4 mg total) by mouth every 8 (eight) hours as needed for nausea or vomiting.   potassium chloride  SA 20 MEQ tablet Commonly known as: KLOR-CON  M Take 1 tablet (20 mEq total) by mouth daily. Need appt for future refills   Prodigy Twist Top Lancets 28G Misc USE TO CHECK BLOOD SUGARS   simvastatin  20 MG tablet Commonly known as: ZOCOR  Take 1 tablet (20 mg total) by mouth every evening.   spironolactone  25 MG tablet Commonly known as: ALDACTONE  Take 0.5 tablets (12.5 mg total) by mouth daily. Needs appt for future refills   tiZANidine 2 MG tablet Commonly known as: ZANAFLEX Take 2 mg by mouth 2 (two) times daily.   torsemide  20 MG tablet Commonly known as: DEMADEX  Take 4 tablets (80 mg total) by mouth daily. Need appt for future refills   Tresiba  FlexTouch 200 UNIT/ML FlexTouch Pen Generic drug: insulin  degludec Inject 8 Units into the skin daily at 6 (six) AM.   Trulicity  3 MG/0.5ML Soaj Generic drug: Dulaglutide  Inject 3 mg as directed once a week.         OBJECTIVE:   Vital Signs: There were no  vitals taken for this visit.  Wt Readings from Last 3 Encounters:  05/05/23 179 lb (81.2 kg)  03/23/23 175 lb  12.8 oz (79.7 kg)  03/07/23 170 lb (77.1 kg)     Exam: General: Pt appears well and is in NAD  Neuro: MS is good with appropriate affect, pt is alert and Ox3       DM Foot Exam: 09/13/2023  per podiatry       DATA REVIEWED:  Lab Results  Component Value Date   HGBA1C 7.7 (A) 03/23/2023   HGBA1C 8.7 (A) 07/20/2022   HGBA1C 7.4 (A) 01/19/2022    Latest Reference Range & Units 09/20/23 21:51  Sodium 135 - 145 mmol/L 141  Potassium 3.5 - 5.1 mmol/L 4.5  Chloride 98 - 111 mmol/L 106  CO2 22 - 32 mmol/L 27  Glucose 70 - 99 mg/dL 811 (H)  BUN 8 - 23 mg/dL 28 (H)  Creatinine 9.14 - 1.00 mg/dL 7.82 (H)  Calcium  8.9 - 10.3 mg/dL 9.5  Anion gap 5 - 15  8  GFR, Estimated >60 mL/min 47 (L)  (H): Data is abnormally high (L): Data is abnormally low ASSESSMENT / PLAN / RECOMMENDATIONS:   1) Type 2 Diabetes Mellitus,Sub- optimally controlled, With neuropathic, retinopathic and CKD III complications - Most recent A1c of 7.7 %. Goal A1c < 7.0 %.    -Unable to obtain A1c due to being a virtual visit -Amy [name] has had a couple hypoglycemic episodes, these episodes have been preventable, but the first episode happened at her podiatrist office, she took 6 units of Humalog  and did not eat, the following episode happened last night she has had low appetite all day and did not feel good and took 6 units of Humalog  while eating what was available, followed by hypoglycemia requiring ED visit -I did educate the Amy [name] regarding the proper use of Humalog , and that she must eat within 10-15 minutes of taking Humalog  to prevent hypoglycemia, and if should she avoid eating a meal then she should not have taking any Humalog .  I also advised the Amy [name] that if she is going to eat a smaller meal than usual then she should reduce her Humalog  dose by 50% -Amy [name] expressed understanding -I also  advised the Amy [name] to use glucose meter to verify hypoglycemic episodes on the Dexcom -A prescription for a new Prodigy meter was sent - Main barrier to diabetes self care is visual impairment    MEDICATIONS: -Continue Trulicity  3 mg weekly - Continue Tresiba  8 units daily  - Take Humalog  3-6 units before every meal 3 times daily -Continue Farxiga  10 mg daily -CF: Humalog  (BG-130/50) TIDQAC  EDUCATION / INSTRUCTIONS: BG monitoring instructions: Amy [name] is instructed to check her blood sugars 3 times a day, before meals. Call [facility] if: BG persistently < 70  I reviewed the Rule of 15 for the treatment of hypoglycemia in detail with the Amy [name]. Literature supplied.    2) Diabetic complications:  Eye: Does  have known diabetic retinopathy.  Neuro/ Feet: Does  have known diabetic peripheral neuropathy .  Renal: Amy [name] does  have known baseline CKD. She   is not on an ACEI/ARB at present.      F/U in 6 months    Signed electronically by: [name] Bail, MD  [facility] Endocrinology  [facility] [address] [place], [place] [address] Phone: [phone] FAX: [phone]   CC: [facility] [address] [name] [address] Phone: [phone]  Fax: [phone]  Return to Endocrinology clinic as below: Future Appointments  Date Time Provider Department Center  09/21/2023  10:50 AM Auston Halfmann, Julian Obey, MD [facility] None  10/17/2023 11:30 AM Terrilyn Fick, NP [facility] None  12/20/2023  9:45 AM Luella Sager, DPM [facility]

## 2023-10-16 NOTE — Patient Instructions (Incomplete)

## 2023-10-16 NOTE — Progress Notes (Deleted)
 [name] DOB: 02/23/1961  REASON FOR VISIT: follow up HISTORY FROM: patient  No chief complaint on file.    HISTORY OF PRESENT ILLNESS:  10/16/23 ALL: Amy [name] returns for follow up for OSA on CPAP.     04/20/2022 SA:  Amy [name] is a 63 year old right-handed woman with an underlying medical history of diabetes, blindness (with L prosthetic eye and R eye blindness d/t diabetes), hyperlipidemia, asthma, arthritis, spinal stenosis, thyroid  disease, nephropathy, orthostatic hypotension deemed secondary to diabetic autonomic neuropathy (seen by Dr. Salli [name] in 9/17), and obesity, who presents for follow up consultation of her sleep apnea, after recent sleep study testing and starting treatment with a new CPAP machine.  The patient is accompanied by her aide today.  I last saw her on 12/29/2016 at which time she was mildly suboptimal with her CPAP.   She had a split-night sleep study on 12/21/2021 which showed a baseline AHI of 79.1/h, O2 nadir 50%.  She had good response to CPAP therapy of 10 cm.  Her AHI was reduced to 1.9/h with O2 nadir of 93% but non-REM sleep achieved on this pressure. Based on her test results I prescribed a new CPAP machine.  Her set up date was 03/04/2022. She has a ResMed air sense 11 AutoSet machine.   Today, 04/20/2022: I reviewed her CPAP compliance data from 03/20/2022 through 04/18/2022, which is a total of 30 days, during which time she used her machine 29 days with percent use days greater than 4 hours at 80%, indicating very good compliance with an average usage of 5 hours and 46 minutes, residual AHI borderline elevated at 6.7/h, leak on the low side with the 95th percentile at 4.6 L/min on a pressure of 10 cm with EPR of 3.  She reports doing fairly well with her new machine.  She likes her new machine but the water chamber is smaller and sometimes in the middle of the night she runs out of water.  She does have some mouth dryness, uses a fullface mask  and is noticing improvement in her daytime somnolence.  She does not sleep all that well and has sleep disruption.  She is motivated to continue with treatment at this time.  11/09/2021 ALL:  Amy [name] returns for follow up for OSA on CPAP. She was last seen 09/2019 and doing well. She called 12/2020 reporting difficulty with leaks. She was encouraged to work on mask seal and let us  know if she continued to have difficulty. She reports that she has not been able to use it consistently. She has not received updated supplies from DME. She stopped therapy sometime around 07/2021. She reports that she received a call 2 weeks ago but she has not received any supplies. She reports losing about 70lbs over the past year. She is no longer eating red meats due to gout. Split night study 10/02/2016 showed "overall mild obstructive sleep apnea, moderate in REM sleep with a total AHI of 6.8/hour, REM AHI of 28/hour, and O2 nadir of 78%."   10/09/2019 ALL: Amy [name] is a 63 y.o. female here today for follow up of OSA on CPAP. She is doing well. She has had some concerns of congestion and allergies over the past week. She feels that CPAP is hard to use when this happens. She feels that pressure is too strong for her. She usually tolerates CPAP well and denies any persistent concerns.   Compliance report dated 09/08/2019 through 10/07/2019 reveals that she  used CPAP 29 of the past 30 days for compliance of 97%.  She used CPAP greater than 4 hours 23 of the past 30 days for compliance of 77%.  Average usage was 5 hours and 46 minutes.  Residual AHI was 4.1 on 8 cm of water and an EPR of 3.  There was no significant leak noted.  HISTORY: (copied from [employer] note on 09/01/2017)  Amy [name] is a 63 year old right-handed woman with an underlying medical history of diabetes, blindness (with L prosthetic eye and R eye blindness d/t diabetes), hyperlipidemia, asthma, arthritis, spinal stenosis, thyroid  disease, nephropathy,  orthostatic hypotension deemed secondary to diabetic autonomic neuropathy (seen by Dr. Salli [name] in 9/17), and morbid obesity, who presents for follow up consultation of her sleep apnea, after recent sleep study testing. The patient is unaccompanied today. I first met her on 09/22/16, at the request of her PCP, at which time she reported snoring and excessive daytime somnolence. I invited her for a sleep study. She had a baseline sleep study, followed by a CPAP titration study. I went over her test results with her in detail today. Baseline sleep study from 10/02/2016 showed a sleep efficiency of 84.8%, sleep latency of 18.5 minutes, REM latency of 72.5 minutes, she had absence of slow-wave sleep, an increased percentage of stage II sleep, REM sleep at 16.8%. Total AHI was 6.8 per hour, REM AHI 28 per hour, supine sleep was nearly absent. Average oxygen  saturation was 95%, nadir was 78%, time below 89% saturation of 24 minutes. Based on her sleep-related complaints and significant desaturations noted during REM sleep I suggested she return for a full night CPAP titration study. She had this on 10/18/2016, sleep efficiency was 85.4%, sleep latency of 34 minutes, REM latency was 194 minutes. She had slow-wave sleep at 7.8%, REM sleep at 12.1%. She was fitted with medium nasal pillows and CPAP was titrated from 5 cm to 9 cm. On the final pressure her AHI was 0 per hour, nonsupine REM sleep was achieved and alternated was 86%. I suggested a home CPAP pressure of 10 cm.  12/29/16 Dr. Omar [name] I reviewed her CPAP compliance data from 11/28/16 to 12/27/16, which is a total of 30 days, during which time she used her CPAP 26 days, with percent used days greater than 4 hours of 63%, indicating suboptimal compliance, average usage of 5 hours and 27 minutes, average AHI of 3/hour, leak low with the 95th percentile of 2.8 lpm on a pressure of 10 cm with EPR of 3  UPDATE  09/27/2018CM  Ms.  Amy [name], 63 year old  Female returns for  follow-up with a history of obstructive sleep apnea on CPAP Compliance data dated 01/10/2017 through 02/08/2017 shows greater than 4 hours at 90% for 27  Days. Average usage 6 hours 37  Minutes.  10 cm of pressure. EPR 3. AHI 2.9AHI 2.9. Leak low  with the 95th percentile.  She reports less daytime drowiness. She has a new caregiver  who requires education on cleaning and handling the CPAP equipment. She returns for reevaluation  UPDATE 4/24/2019CM [name], 63 year old female returns for follow-up with a history of  obstructive sleep apnea on CPAP.  She is not having any problems with her machine she denies any air leak from her mask.  She reports the last few days  she has been stuffy and not use the machine, she has allergies.  CPAP compliance dated 08/05/2017-09/03/2017 showed compliance greater than 4 hours 83% for 25 days.  Average  usage 6 hours 38.  Set pressure 8 cm.  AHI 3.6 leaks 95 percentile at 2.4.  She has a caregiver 4 hours a day from 10-2 the patient is blind.  She returns for reevaluation of    REVIEW OF SYSTEMS: Out of a complete 14 system review of symptoms, the patient complains only of the following symptoms, blind, gait impairment and all other reviewed systems are negative.   ALLERGIES: Allergies  Allergen Reactions   Penicillins Hives and Swelling    Has patient had a PCN reaction causing immediate rash, facial/tongue/throat swelling, SOB or lightheadedness with hypotension: yes- face swelling Has patient had a PCN reaction causing severe rash involving mucus membranes or skin necrosis: no Has patient had a PCN reaction that required hospitalization unknown (childhood allergy) Has patient had a PCN reaction occurring within the last 10 years: no If all of the above answers are "NO", then may proceed with Cephalosporin use.    Tramadol  Nausea And Vomiting    Intense nausea   Vicodin [Hydrocodone -Acetaminophen ] Nausea And Vomiting   Codeine Nausea Only    Other  reaction(s): Unknown   Iodine-131 Hives   Iohexol Hives    Pt developed itching and hives along with nasal congestion; needs 13 hour premeds for future studies, Onset Date: [id number]    Lisinopril Cough    Other reaction(s): Unknown   Sulfa Antibiotics Nausea And Vomiting    HOME MEDICATIONS: Outpatient Medications Prior to Visit  Medication Sig Dispense Refill   albuterol  (PROVENTIL  HFA;VENTOLIN  HFA) 108 (90 BASE) MCG/ACT inhaler Inhale 2 puffs into the lungs every 4 (four) hours as needed for wheezing or shortness of breath. For short     allopurinol (ZYLOPRIM) 300 MG tablet Take 300 mg by mouth 2 (two) times daily.     aspirin  EC 81 MG tablet Take 81 mg by mouth daily.     blood glucose meter kit and supplies Dispense based on patient and insurance preference. Use up to four times daily as directed. (FOR ICD-10 E10.9, E11.9). 1 each 0   Blood Glucose Monitoring Suppl (PRODIGY AUTOCODE BLOOD GLUCOSE) w/Device KIT 1 Device by Does not apply route daily in the afternoon. 1 kit 0   celecoxib (CELEBREX) 200 MG capsule celecoxib 200 mg capsule TK 1 C PO BID     cetirizine (ZYRTEC) 10 MG tablet Take 10 mg by mouth daily.     clotrimazole -betamethasone  (LOTRISONE ) cream Apply 1 application topically 2 (two) times daily. 30 g 0   colchicine 0.6 MG tablet Take by mouth.     Continuous Blood Gluc Sensor (DEXCOM G7 SENSOR) MISC 1 Device by Does not apply route as directed. 9 each 3   dapagliflozin  propanediol (FARXIGA ) 10 MG TABS tablet Take 1 tablet (10 mg total) by mouth daily. 90 tablet 3   docusate sodium  (COLACE) 100 MG capsule Take 100 mg by mouth 2 (two) times daily.     Dulaglutide  (TRULICITY ) 3 MG/0.5ML SOAJ Inject 3 mg as directed once a week. 6 mL 3   EMBRACE TALK GLUCOSE TEST test strip USE TO CHECK BLOOD SUGARS FOUR TIMES A DAY BEFORE MEALS AND AT BEDTIME DX E11.29 300 strip 3   esomeprazole  (NEXIUM ) 40 MG capsule Take 1 capsule (40 mg total) by mouth daily. 30 capsule 0   fluticasone   (FLONASE ) 50 MCG/ACT nasal spray Place 2 sprays into both nostrils daily.     gabapentin  (NEURONTIN ) 400 MG capsule Take 400 mg by mouth 3 (three) times daily.  glucose blood (ONETOUCH VERIO) test strip Check blood sugar 1-2 times daily 100 each 12   hydrochlorothiazide (MICROZIDE) 12.5 MG capsule Take 12.5 mg by mouth.     hydrOXYzine  (ATARAX /VISTARIL ) 25 MG tablet Take 0.5 tablets (12.5 mg total) by mouth every 8 (eight) hours as needed for itching. 30 tablet 0   insulin  degludec (TRESIBA  FLEXTOUCH) 200 UNIT/ML FlexTouch Pen Inject 8 Units into the skin daily at 6 (six) AM. 15 mL 2   insulin  lispro (HUMALOG ) 100 UNIT/ML KwikPen MAX DAILY 30 UNITS 30 mL 3   Insulin  Pen Needle 32G X 4 MM MISC 1 Device by Does not apply route in the morning, at noon, in the evening, and at bedtime. 400 each 3   levofloxacin  (LEVAQUIN ) 750 MG tablet Take 750 mg by mouth daily. (Patient not taking: Reported on 09/21/2023)     levothyroxine  (SYNTHROID ) 150 MCG tablet Take 1 tablet (150 mcg total) by mouth daily. Needs appt for future refills 90 tablet 0   linaclotide  (LINZESS ) 145 MCG CAPS capsule Take 145 mcg by mouth daily as needed (constipation).     losartan (COZAAR) 25 MG tablet Take 25 mg by mouth daily.     naproxen  (NAPROSYN ) 500 MG tablet Take 500 mg by mouth 2 (two) times daily with a meal.     nitrofurantoin , macrocrystal-monohydrate, (MACROBID ) 100 MG capsule Take 1 capsule (100 mg total) by mouth 2 (two) times daily. 10 capsule 0   olmesartan (BENICAR) 20 MG tablet Take 10 mg by mouth daily.     olmesartan (BENICAR) 40 MG tablet Take 40 mg by mouth daily.     ondansetron  (ZOFRAN  ODT) 4 MG disintegrating tablet Take 1 tablet (4 mg total) by mouth every 8 (eight) hours as needed for nausea or vomiting. 10 tablet 0   potassium chloride  SA (KLOR-CON ) 20 MEQ tablet Take 1 tablet (20 mEq total) by mouth daily. Need appt for future refills 90 tablet 0   Prodigy Twist Top Lancets 28G MISC USE TO CHECK BLOOD  SUGARS     simvastatin  (ZOCOR ) 20 MG tablet Take 1 tablet (20 mg total) by mouth every evening. 90 tablet 0   spironolactone  (ALDACTONE ) 25 MG tablet Take 0.5 tablets (12.5 mg total) by mouth daily. Needs appt for future refills 15 tablet 0   tiZANidine (ZANAFLEX) 2 MG tablet Take 2 mg by mouth 2 (two) times daily.     torsemide  (DEMADEX ) 20 MG tablet Take 4 tablets (80 mg total) by mouth daily. Need appt for future refills 120 tablet 0   No facility-administered medications prior to visit.    PAST MEDICAL HISTORY: Past Medical History:  Diagnosis Date   Arthritis    Asthma    Blind    Blindness and low vision    left eye glass eye,  legally blind in right eye   Diabetes mellitus    Diabetic neuropathy (HCC)    Hyperlipidemia    Hypertension    Hypothyroidism    Intractable nausea and vomiting 03/06/2018   Spinal stenosis     PAST SURGICAL HISTORY: Past Surgical History:  Procedure Laterality Date   ABDOMINAL HYSTERECTOMY  1990   BREAST BIOPSY Left    BREAST BIOPSY Right    BREAST BIOPSY Right 08/19/2022   US  RT BREAST BX W LOC DEV 1ST LESION IMG BX SPEC US  GUIDE 08/19/2022 [facility]   CESAREAN SECTION  1987, 1990   x 2   CHOLECYSTECTOMY  2008   COLONOSCOPY WITH PROPOFOL  N/A  05/05/2023   Procedure: COLONOSCOPY WITH PROPOFOL ;  Surgeon: Alvis Jourdain, MD;  Location: [facility];  Service: Gastroenterology;  Laterality: N/A;   ENUCLEATION Bilateral 09/15/1998   EYE SURGERY  2016   fitting artificial eye   POLYPECTOMY  05/05/2023   Procedure: POLYPECTOMY;  Surgeon: Alvis Jourdain, MD;  Location: [facility];  Service: Gastroenterology;;    FAMILY HISTORY: Family History  Problem Relation Age of Onset   Diabetes Sister    Glaucoma Sister    Hypertension Sister    Healthy Mother    Healthy Father    Stroke Brother    Heart attack Paternal Aunt    Breast cancer Neg Hx     SOCIAL HISTORY: Social History   Socioeconomic History   Marital status: Single     Spouse name: Not on file   Number of children: 2   Years of education: 12   Highest education level: Not on file  Occupational History   Occupation: N/A    Comment: disabled  Tobacco Use   Smoking status: Former    Current packs/day: 0.00    Types: Cigarettes    Quit date: 05/22/1992    Years since quitting: 31.4   Smokeless tobacco: Never  Vaping Use   Vaping status: Never Used  Substance and Sexual Activity   Alcohol use: No    Comment: quit 20 yrs ago   Drug use: No   Sexual activity: Not Currently  Other Topics Concern   Not on file  Social History Narrative   Lives alone   caffeine drinks 2 cups of coffee a day, occasional soda     Right Handed   Social Drivers of Health   Financial Resource Strain: Medium Risk (10/02/2019)   Overall Financial Resource Strain (CARDIA)    Difficulty of Paying Living Expenses: Somewhat hard  Food Insecurity: No Food Insecurity (10/02/2019)   Hunger Vital Sign    Worried About Running Out of Food in the Last Year: Never true    Ran Out of Food in the Last Year: Never true  Transportation Needs: No Transportation Needs (05/29/2019)   PRAPARE - Administrator, Civil Service (Medical): No    Lack of Transportation (Non-Medical): No  Physical Activity: Not on file  Stress: Not on file  Social Connections: Unknown (03/09/2023)   [facility]   Social Network    Social Network: Not on file  Intimate Partner Violence: Unknown (03/09/2023)   Received from [facility]   HITS    Physically Hurt: Not on file    Insult or Talk Down To: Not on file    Threaten Physical Harm: Not on file    Scream or Curse: Not on file      PHYSICAL EXAM  There were no vitals filed for this visit.  There is no height or weight on file to calculate BMI.  Generalized: Well developed, in no acute distress  Cardiology: normal rate and rhythm, no murmur noted Respiratory: clear to auscultation bilaterally  Neurological  examination  Mentation: Alert oriented to time, place, history taking. Follows all commands speech and language fluent Cranial nerve II-XII: Patient is blind. Facial sensation and strength were normal. Head turning and shoulder shrug  were normal and symmetric. Motor: The motor testing reveals 5 over 5 strength of all 4 extremities.  Gait and station: walks with Rolator guided by her sister  DIAGNOSTIC DATA (LABS, IMAGING, TESTING) - I reviewed patient records, labs, notes, testing and imaging  myself where available.      No data to display           Lab Results  Component Value Date   WBC 9.0 09/20/2023   HGB 12.7 09/20/2023   HCT 40.6 09/20/2023   MCV 83.2 09/20/2023   PLT 197 09/20/2023      Component Value Date/Time   NA 141 09/20/2023 2151   NA 145 (H) 06/08/2020 1543   K 4.5 09/20/2023 2151   CL 106 09/20/2023 2151   CO2 27 09/20/2023 2151   GLUCOSE 102 (H) 09/20/2023 2151   BUN 28 (H) 09/20/2023 2151   BUN 44 (H) 06/08/2020 1543   CREATININE 1.28 (H) 09/20/2023 2151   CREATININE 0.92 10/22/2012 1351   CALCIUM  9.5 09/20/2023 2151   PROT 6.5 02/19/2023 2342   PROT 7.7 04/13/2020 1150   ALBUMIN 3.2 (L) 02/19/2023 2342   ALBUMIN 4.0 04/13/2020 1150   AST 13 (L) 02/19/2023 2342   ALT 10 02/19/2023 2342   ALKPHOS 85 02/19/2023 2342   BILITOT 0.2 (L) 02/19/2023 2342   BILITOT 0.2 04/13/2020 1150   GFRNONAA 47 (L) 09/20/2023 2151   GFRAA 43 (L) 06/08/2020 1543   Lab Results  Component Value Date   CHOL 126 04/13/2020   HDL 42 04/13/2020   LDLCALC 66 04/13/2020   TRIG 95 04/13/2020   CHOLHDL 3.0 04/13/2020   Lab Results  Component Value Date   HGBA1C 7.7 (A) 03/23/2023   Lab Results  Component Value Date   VITAMINB12 325 05/25/2011   Lab Results  Component Value Date   TSH 2.270 04/13/2020       ASSESSMENT AND PLAN 63 y.o. year old female  has a past medical history of Arthritis, Asthma, Blind, Blindness and low vision, Diabetes mellitus,  Diabetic neuropathy (HCC), Hyperlipidemia, Hypertension, Hypothyroidism, Intractable nausea and vomiting (03/06/2018), and Spinal stenosis. here with   No diagnosis found.    She was previously doing well on CPAP therapy but has not used since 07/2021. No recent compliance data. She has lost 70lbs over the past couple of years. Split night study in 09/2016 showed moderate OSA in REM sleep. We will repeat HST. She was encouraged to use her CPAP in the meantime. She was encouraged to reach out to [employer] and provided the number to call for supplies. We will reevaluate need pending new sleep study results. She will continue close follow-up with her healthcare team.  She will follow-up with me pending sleep results. She verbalizes understanding and agreement with this plan.   No orders of the defined types were placed in this encounter.    No orders of the defined types were placed in this encounter.     Terrilyn Fick, FNP-C 10/16/2023, 10:57 AM [facility] [address] [place], [place] [address] [phone]

## 2023-10-16 NOTE — Progress Notes (Deleted)
 Amy [name]

## 2023-10-17 ENCOUNTER — Encounter: Payer: Self-pay | Admitting: Family Medicine

## 2023-10-17 ENCOUNTER — Ambulatory Visit: Admitting: Family Medicine

## 2023-10-17 DIAGNOSIS — G4733 Obstructive sleep apnea (adult) (pediatric): Secondary | ICD-10-CM

## 2023-11-01 IMAGING — CR DG SI JOINTS 3+V
3 series · 3 of 3 positions shown · non-contrast
Comparison: None Available.

CLINICAL DATA: Low back pain

EXAM:
BILATERAL SACROILIAC JOINTS - 3+ VIEW

[t sacroiliac joints (1 of 3)]
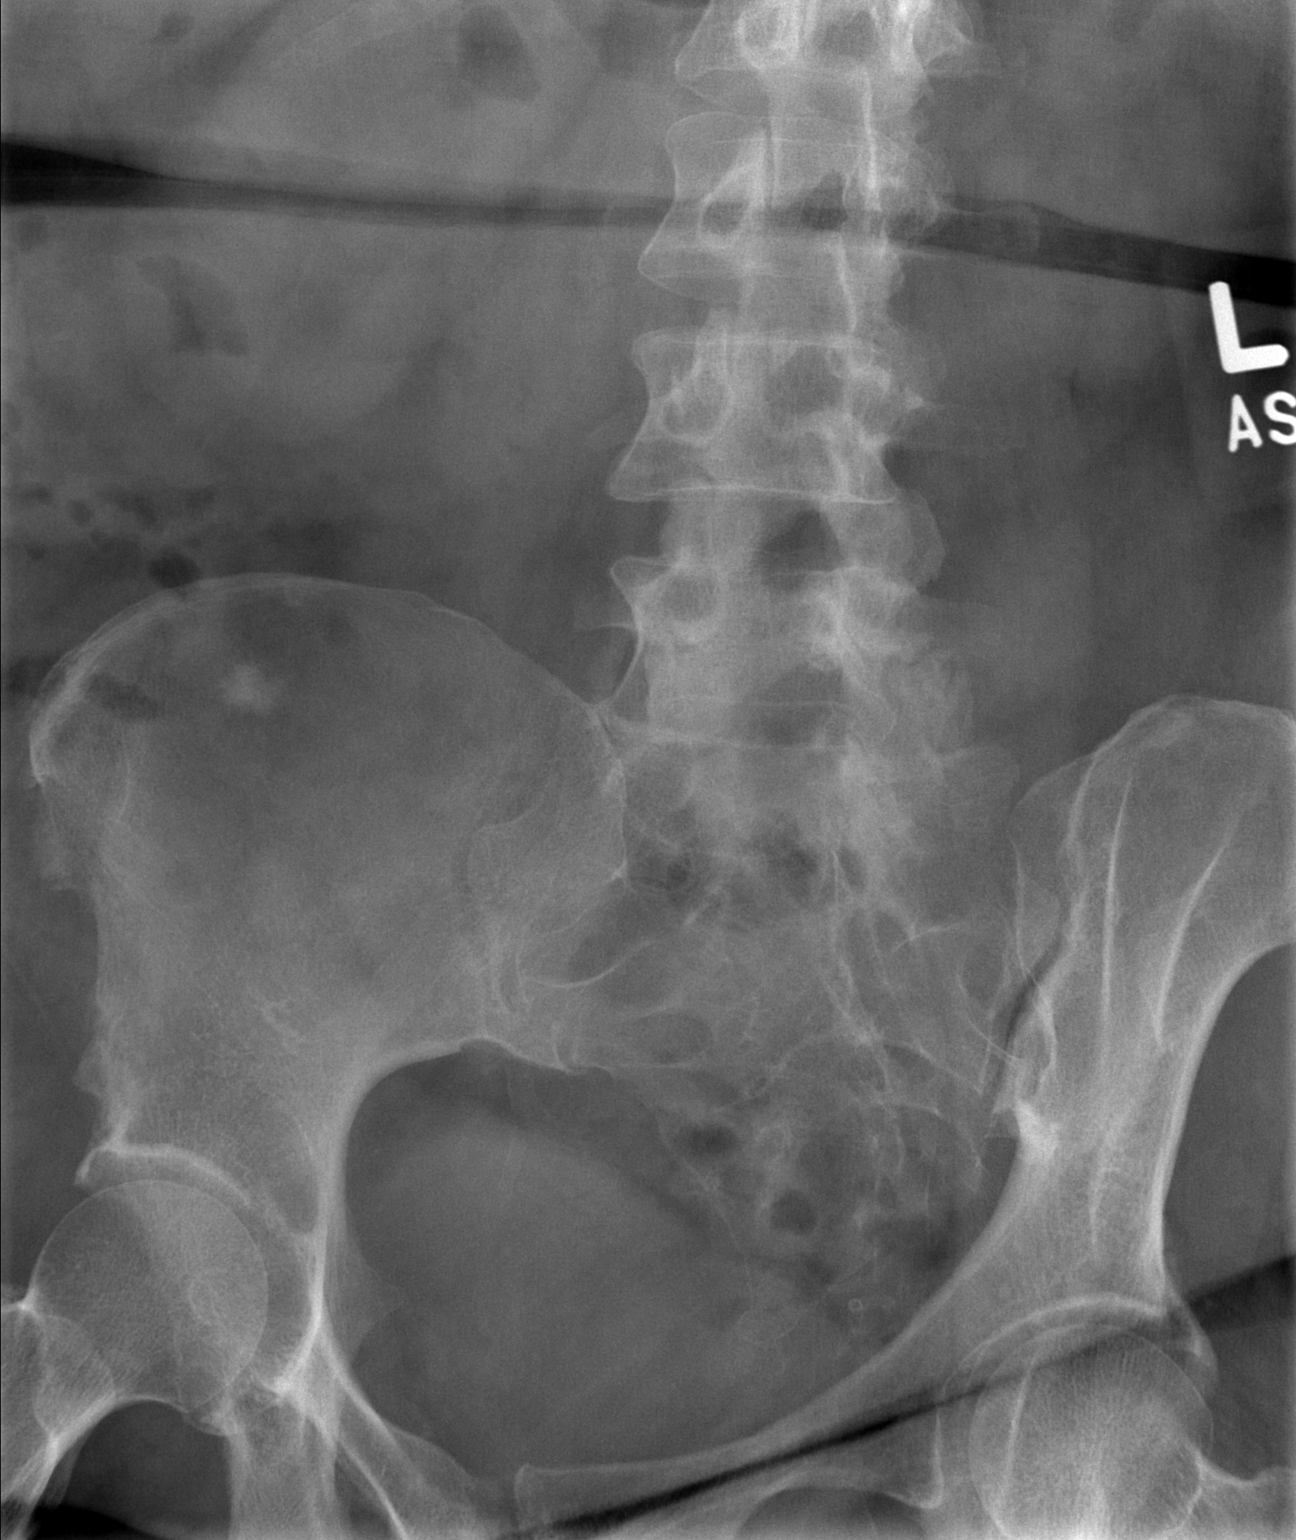

[t sacroiliac joints (2 of 3)]
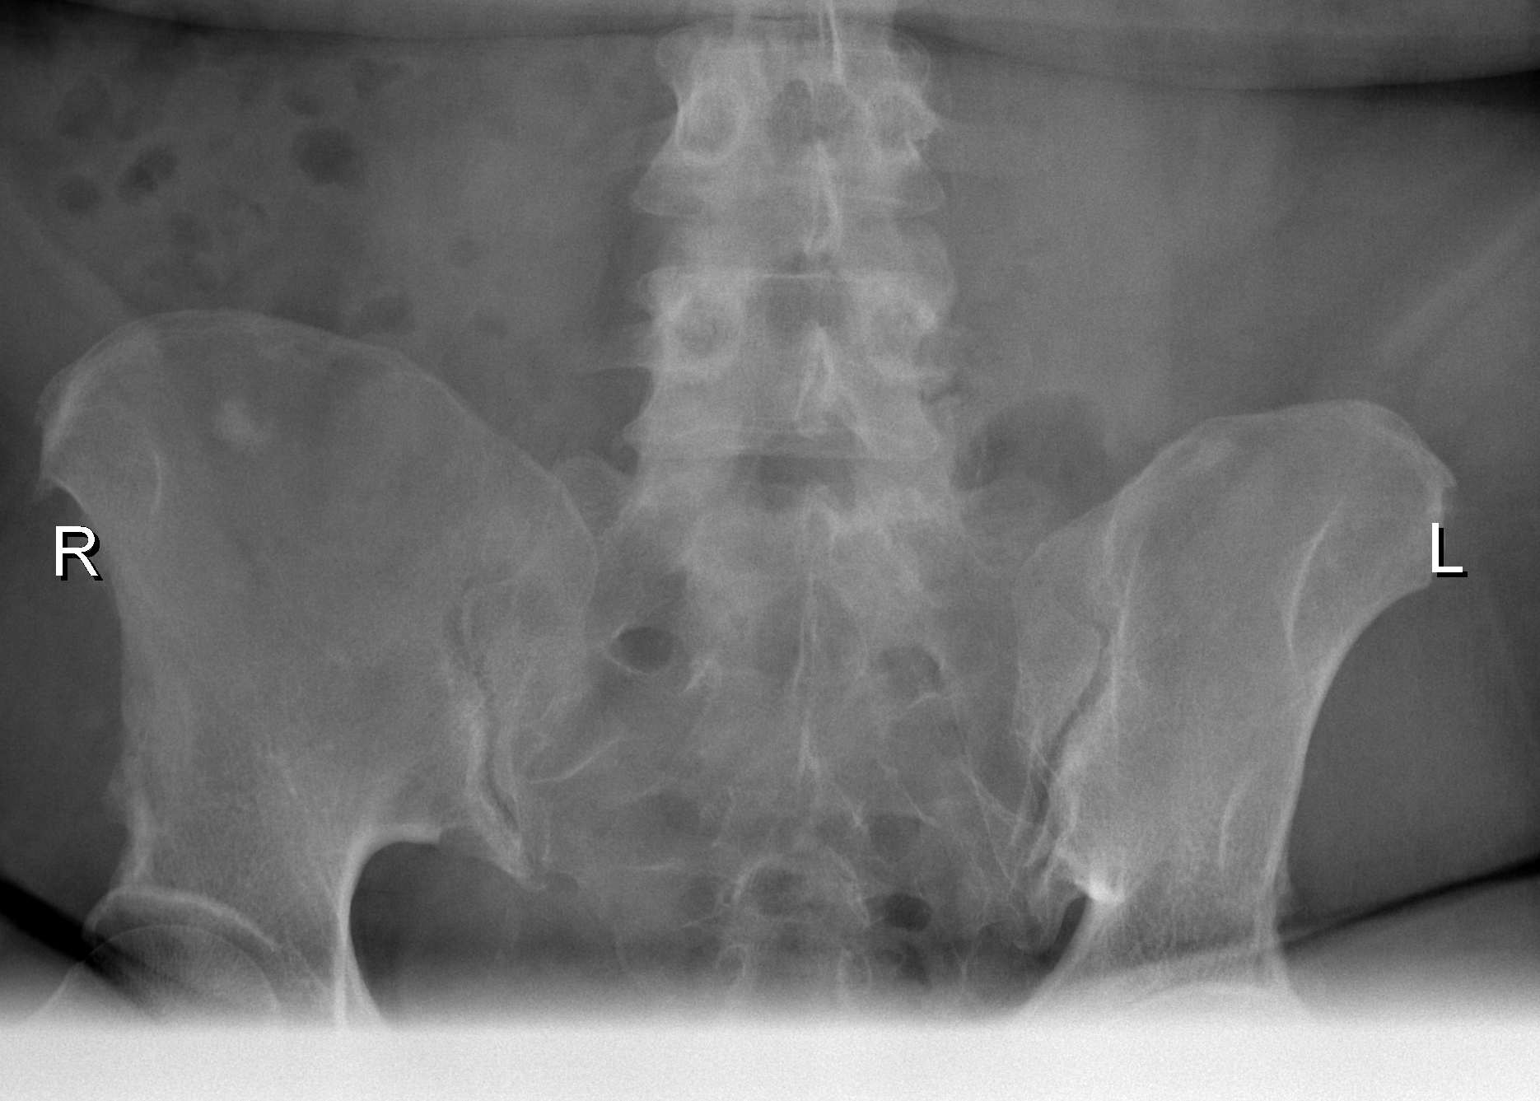

[t sacroiliac joints (3 of 3)]
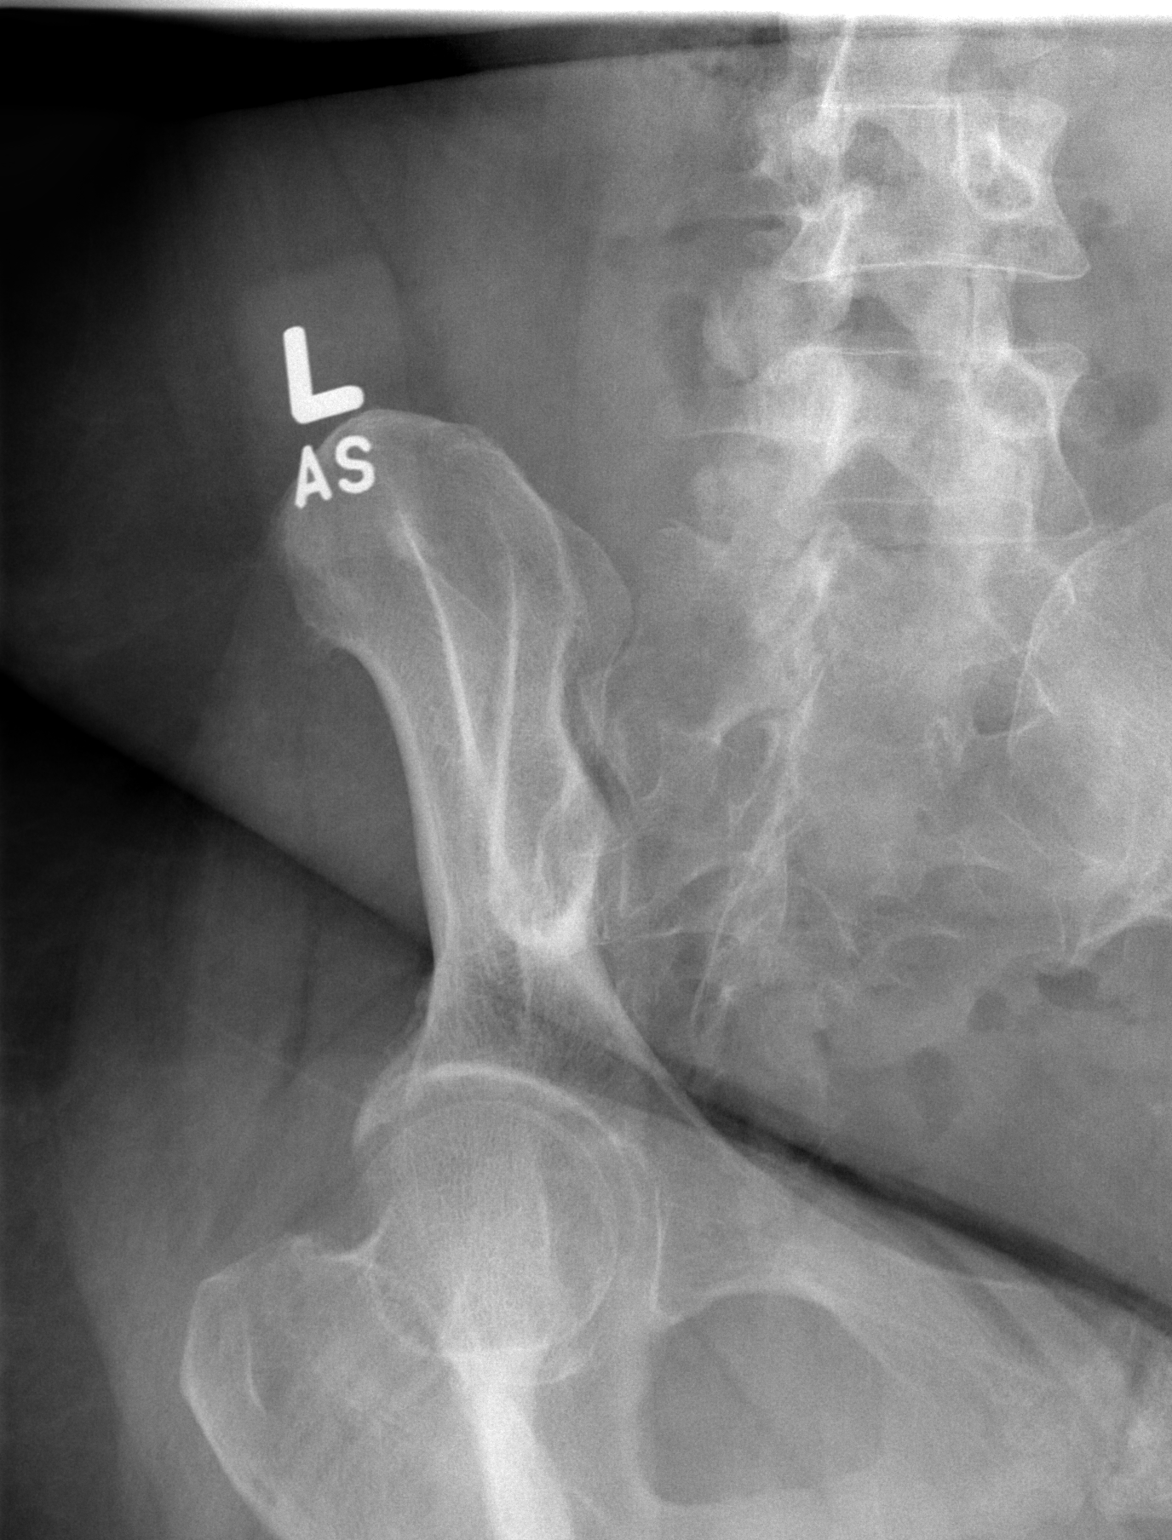

[3 of 3 positions shown; findings below may reference images not displayed]

FINDINGS: Mild degenerative changes of the SI joints demonstrated by small
osteophytes, SI joint spaces otherwise relatively maintained. No
acute fracture. Soft tissues are unremarkable.
IMPRESSION: Mild degenerative changes of the SI joints.

## 2023-11-01 IMAGING — CR DG LUMBAR SPINE COMPLETE W/ BEND
7 series · 7 of 7 positions shown · non-contrast
Comparison: None Available.

CLINICAL DATA: Low back pain

EXAM:
LUMBAR SPINE - COMPLETE WITH BENDING VIEWS

[w l-spine flexion/extension * (1 of 5)]
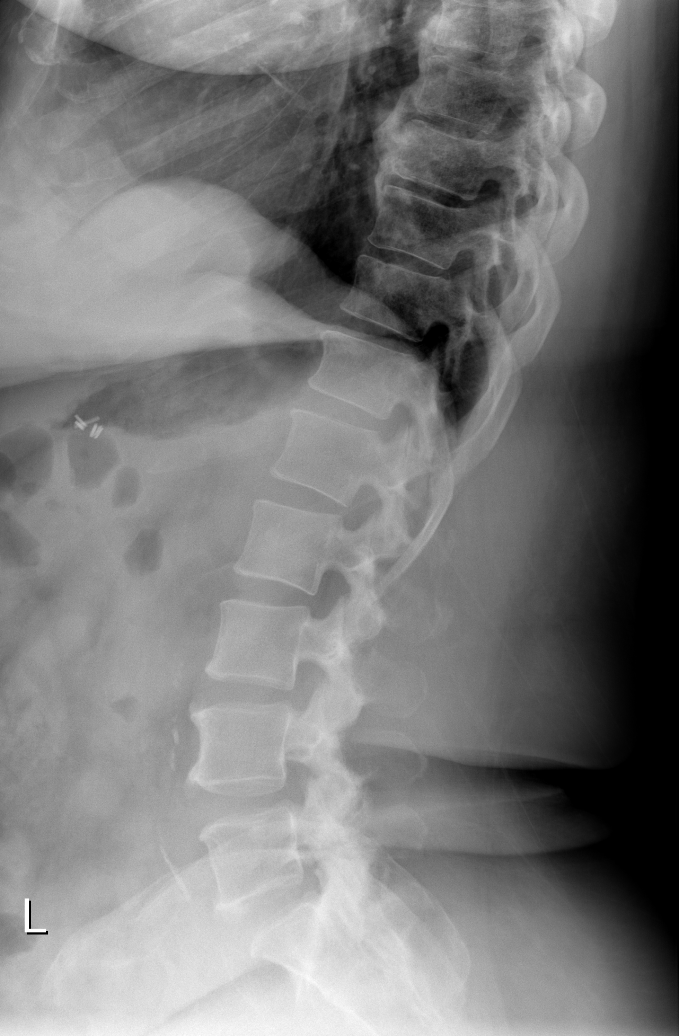

[w l-spine flexion/extension * (2 of 5)]
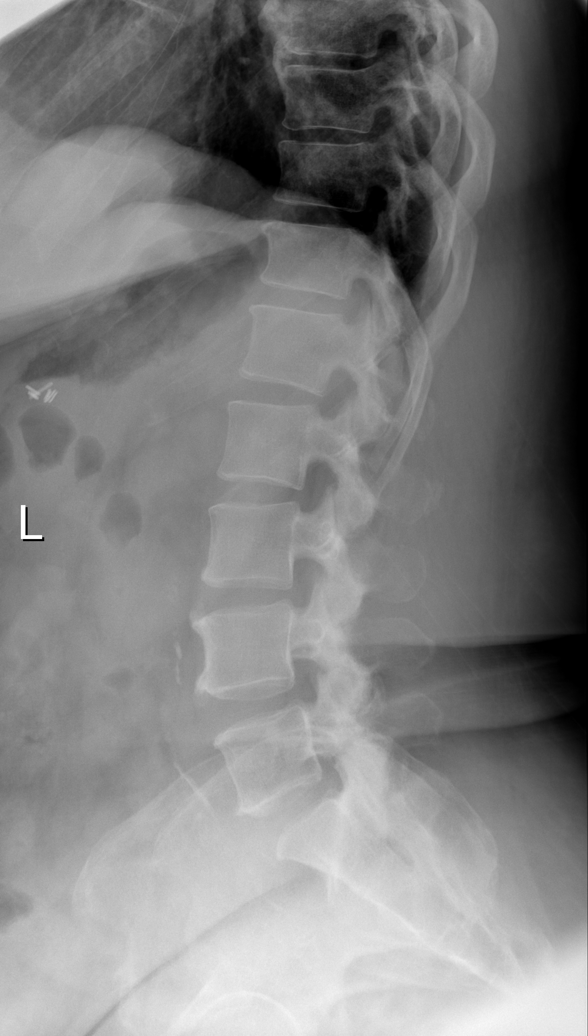

[w l-spine flexion/extension * (3 of 5)]
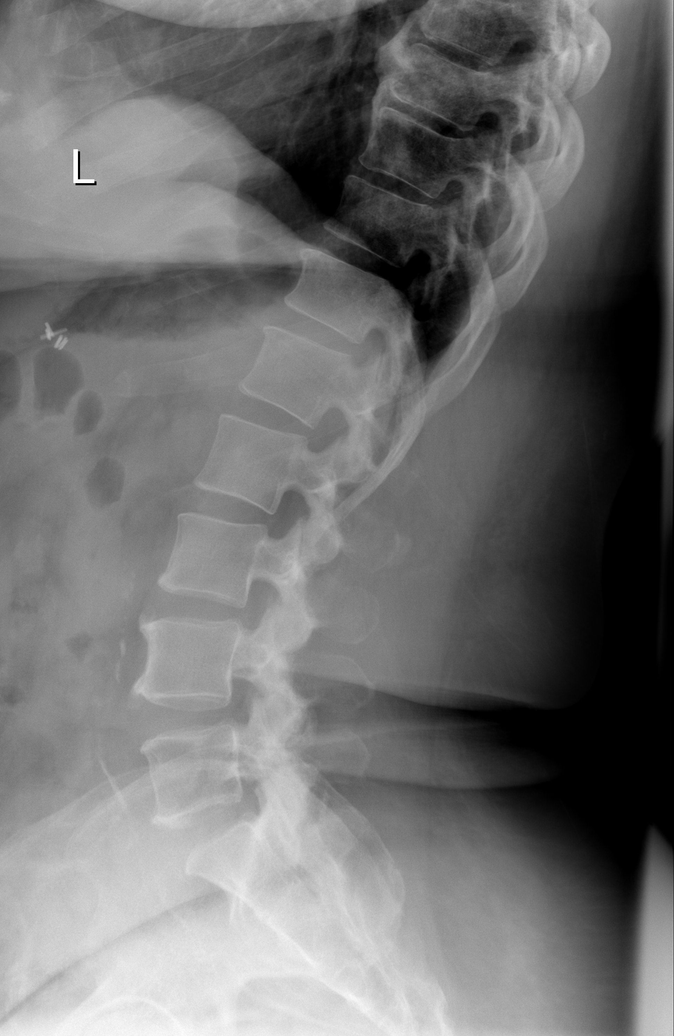

[w l-spine flexion/extension * (4 of 5)]
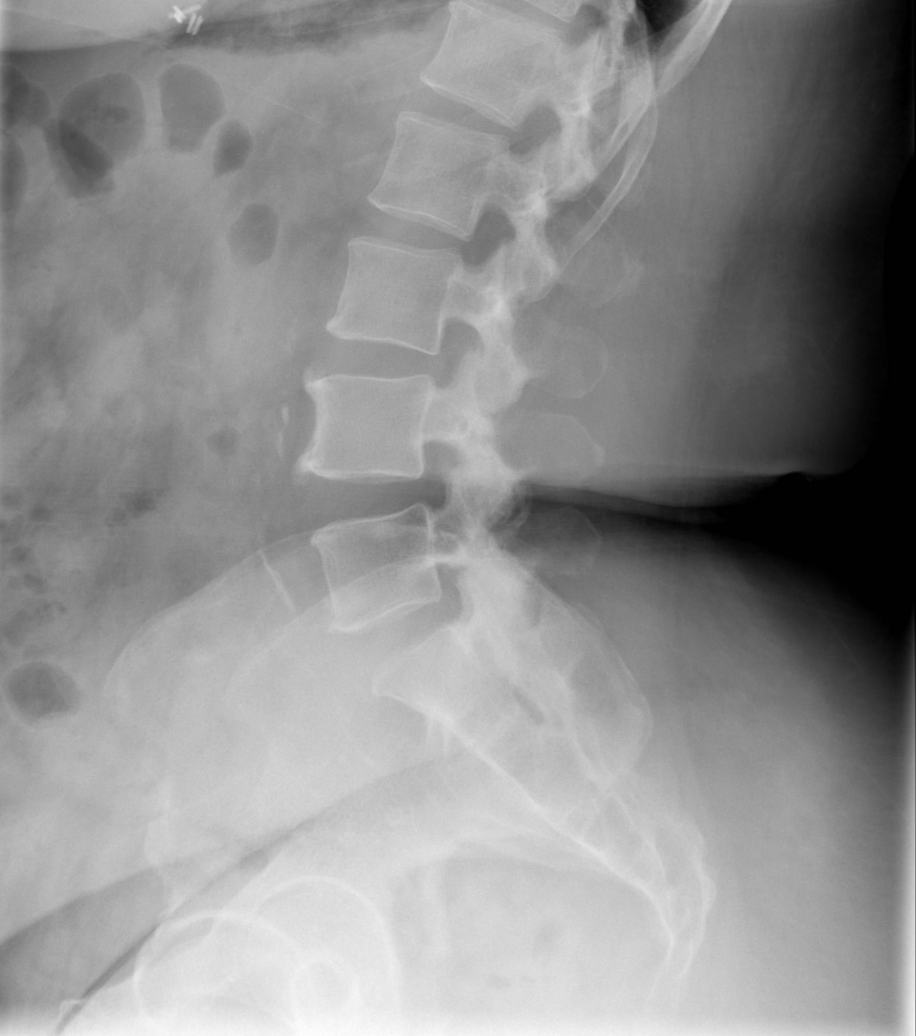

[w l-spine flexion/extension * (5 of 5)]
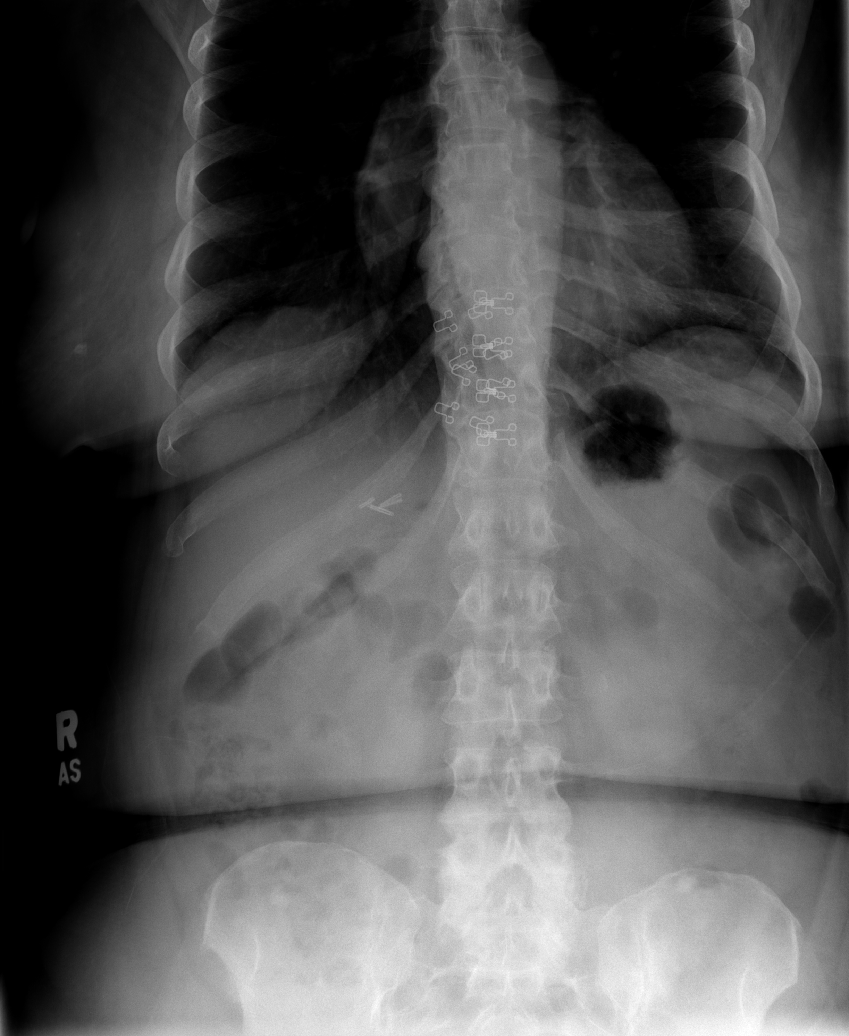

[t l-spine oblique exposure (1 of 2)]
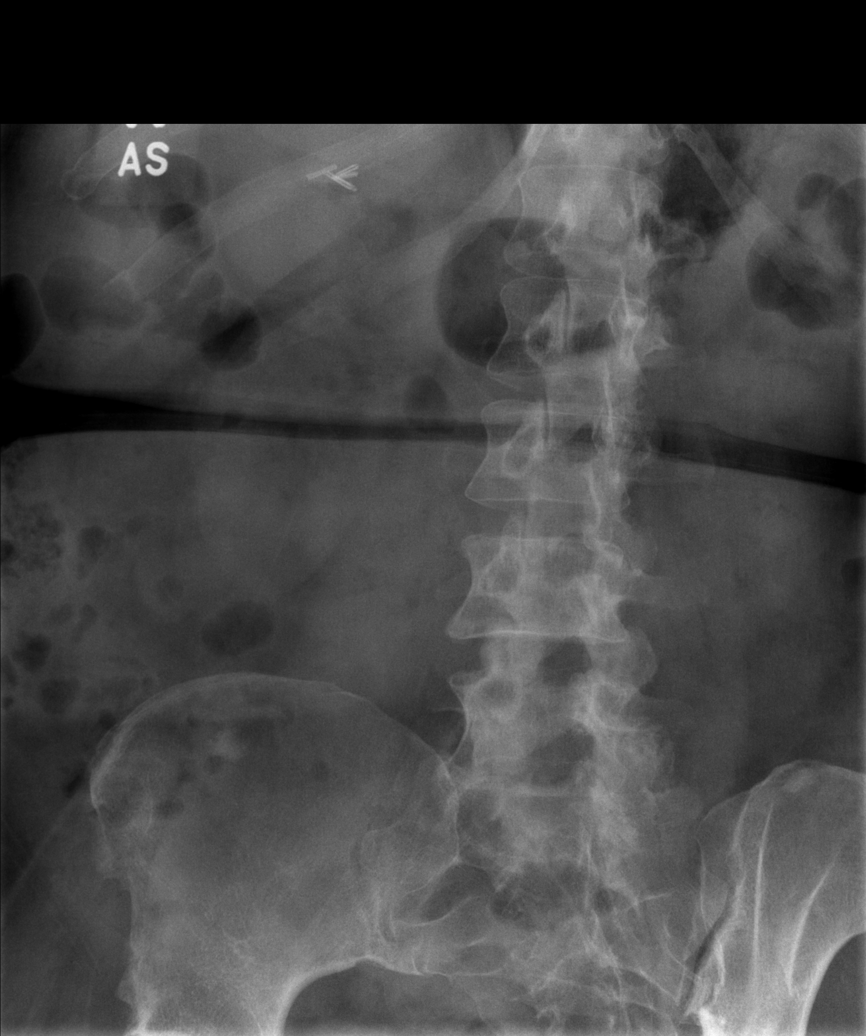

[t l-spine oblique exposure (2 of 2)]
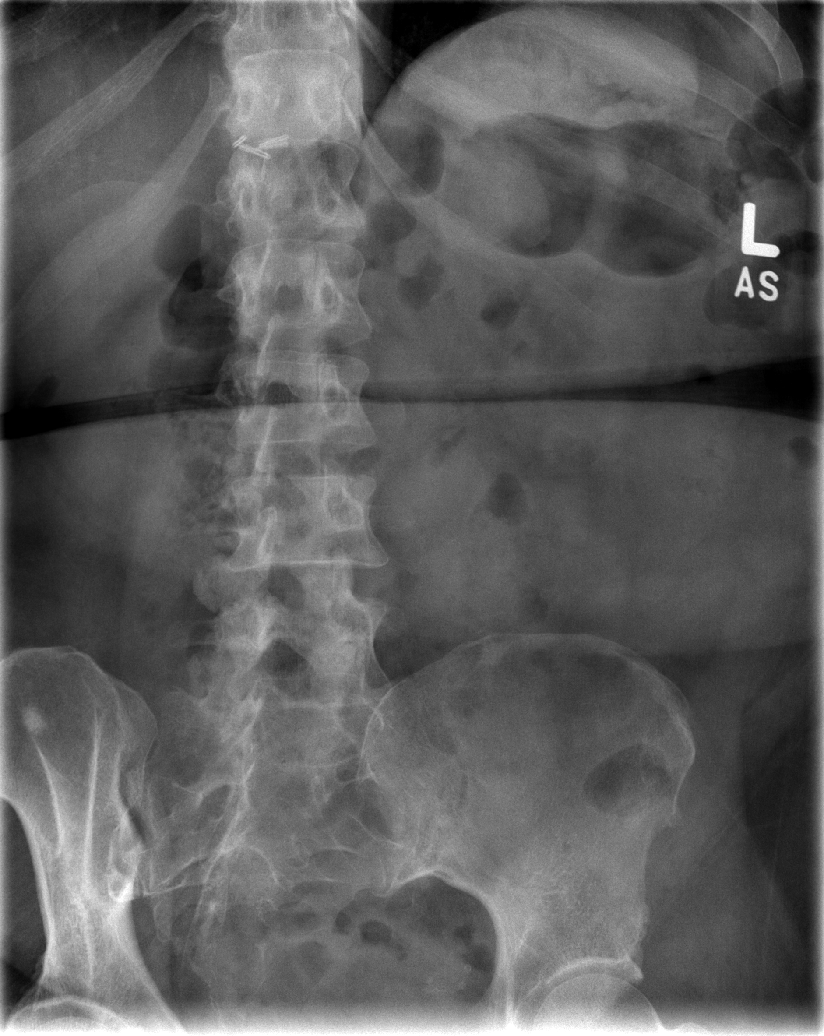

[7 of 7 positions shown; findings below may reference images not displayed]

FINDINGS: Normal alignment, no dynamic instability on flexion-extension views.
Lumbar vertebral body heights are maintained. There are mild
endplate changes at L4. Multilevel facet arthropathy at L4-L5 and
L5-S1. Disc space heights are relatively maintained. Mild
atherosclerotic calcifications of the abdominal aorta. Surgical
clips in the right quadrant.
IMPRESSION: Multilevel facet arthropathy of the lower lumbar spine.

## 2023-11-09 ENCOUNTER — Ambulatory Visit (HOSPITAL_COMMUNITY)
Admission: EM | Admit: 2023-11-09 | Discharge: 2023-11-09 | Disposition: A | Discharge status: Home / Self Care | Attending: Family Medicine | Admitting: Family Medicine

## 2023-11-09 ENCOUNTER — Emergency Department (HOSPITAL_COMMUNITY)

## 2023-11-09 ENCOUNTER — Encounter (HOSPITAL_COMMUNITY): Payer: Self-pay | Admitting: Emergency Medicine

## 2023-11-09 ENCOUNTER — Encounter (HOSPITAL_COMMUNITY): Payer: Self-pay

## 2023-11-09 ENCOUNTER — Other Ambulatory Visit: Payer: Self-pay

## 2023-11-09 ENCOUNTER — Observation Stay (HOSPITAL_COMMUNITY)

## 2023-11-09 ENCOUNTER — Observation Stay (HOSPITAL_COMMUNITY)
Admission: EM | Admit: 2023-11-09 | Discharge: 2023-11-11 | Disposition: A | Discharge status: Home / Self Care | Attending: Family Medicine | Admitting: Family Medicine

## 2023-11-09 DIAGNOSIS — G4733 Obstructive sleep apnea (adult) (pediatric): Secondary | ICD-10-CM | POA: Insufficient documentation

## 2023-11-09 DIAGNOSIS — E039 Hypothyroidism, unspecified: Secondary | ICD-10-CM | POA: Diagnosis present

## 2023-11-09 DIAGNOSIS — N179 Acute kidney failure, unspecified: Secondary | ICD-10-CM | POA: Insufficient documentation

## 2023-11-09 DIAGNOSIS — K219 Gastro-esophageal reflux disease without esophagitis: Secondary | ICD-10-CM | POA: Insufficient documentation

## 2023-11-09 DIAGNOSIS — H547 Unspecified visual loss: Secondary | ICD-10-CM | POA: Diagnosis not present

## 2023-11-09 DIAGNOSIS — E785 Hyperlipidemia, unspecified: Secondary | ICD-10-CM | POA: Diagnosis not present

## 2023-11-09 DIAGNOSIS — I959 Hypotension, unspecified: Secondary | ICD-10-CM

## 2023-11-09 DIAGNOSIS — E1039 Type 1 diabetes mellitus with other diabetic ophthalmic complication: Secondary | ICD-10-CM | POA: Diagnosis present

## 2023-11-09 DIAGNOSIS — J45909 Unspecified asthma, uncomplicated: Secondary | ICD-10-CM | POA: Insufficient documentation

## 2023-11-09 DIAGNOSIS — I5032 Chronic diastolic (congestive) heart failure: Secondary | ICD-10-CM | POA: Diagnosis present

## 2023-11-09 DIAGNOSIS — Z87891 Personal history of nicotine dependence: Secondary | ICD-10-CM | POA: Insufficient documentation

## 2023-11-09 DIAGNOSIS — Z794 Long term (current) use of insulin: Secondary | ICD-10-CM | POA: Diagnosis not present

## 2023-11-09 DIAGNOSIS — E1140 Type 2 diabetes mellitus with diabetic neuropathy, unspecified: Secondary | ICD-10-CM | POA: Diagnosis present

## 2023-11-09 DIAGNOSIS — R0602 Shortness of breath: Secondary | ICD-10-CM

## 2023-11-09 DIAGNOSIS — N1831 Chronic kidney disease, stage 3a: Secondary | ICD-10-CM | POA: Diagnosis not present

## 2023-11-09 DIAGNOSIS — R42 Dizziness and giddiness: Secondary | ICD-10-CM

## 2023-11-09 DIAGNOSIS — E1122 Type 2 diabetes mellitus with diabetic chronic kidney disease: Secondary | ICD-10-CM | POA: Diagnosis not present

## 2023-11-09 DIAGNOSIS — Z7982 Long term (current) use of aspirin: Secondary | ICD-10-CM | POA: Diagnosis not present

## 2023-11-09 DIAGNOSIS — Z23 Encounter for immunization: Secondary | ICD-10-CM | POA: Insufficient documentation

## 2023-11-09 DIAGNOSIS — I130 Hypertensive heart and chronic kidney disease with heart failure and stage 1 through stage 4 chronic kidney disease, or unspecified chronic kidney disease: Secondary | ICD-10-CM | POA: Diagnosis not present

## 2023-11-09 DIAGNOSIS — M109 Gout, unspecified: Secondary | ICD-10-CM | POA: Insufficient documentation

## 2023-11-09 DIAGNOSIS — I10 Essential (primary) hypertension: Secondary | ICD-10-CM | POA: Diagnosis present

## 2023-11-09 LAB — COMPREHENSIVE METABOLIC PANEL WITH GFR
ALT: 13 U/L (ref 0–44)
AST: 17 U/L (ref 15–41)
Albumin: 3.3 g/dL — ABNORMAL LOW (ref 3.5–5.0)
Alkaline Phosphatase: 91 U/L (ref 38–126)
Anion gap: 9 (ref 5–15)
BUN: 25 mg/dL — ABNORMAL HIGH (ref 8–23)
CO2: 25 mmol/L (ref 22–32)
Calcium: 9.5 mg/dL (ref 8.9–10.3)
Chloride: 110 mmol/L (ref 98–111)
Creatinine, Ser: 1.84 mg/dL — ABNORMAL HIGH (ref 0.44–1.00)
GFR, Estimated: 30 mL/min — ABNORMAL LOW (ref >60)
Glucose, Bld: 136 mg/dL — ABNORMAL HIGH (ref 70–99)
Potassium: 4.2 mmol/L (ref 3.5–5.1)
Sodium: 144 mmol/L (ref 135–145)
Total Bilirubin: 0.2 mg/dL (ref 0.0–1.2)
Total Protein: 6.9 g/dL (ref 6.5–8.1)

## 2023-11-09 LAB — CBC
HCT: 41.1 % (ref 36.0–46.0)
Hemoglobin: 13.2 g/dL (ref 12.0–15.0)
MCH: 26 pg (ref 26.0–34.0)
MCHC: 32.1 g/dL (ref 30.0–36.0)
MCV: 80.9 fL (ref 80.0–100.0)
Platelets: 253 10*3/uL (ref 150–400)
RBC: 5.08 MIL/uL (ref 3.87–5.11)
RDW: 16.7 % — ABNORMAL HIGH (ref 11.5–15.5)
WBC: 11.1 10*3/uL — ABNORMAL HIGH (ref 4.0–10.5)
nRBC: 0 % (ref 0.0–0.2)

## 2023-11-09 LAB — D-DIMER, QUANTITATIVE: D-Dimer, Quant: 0.9 ug{FEU}/mL — ABNORMAL HIGH (ref 0.00–0.50)

## 2023-11-09 LAB — POCT FASTING CBG KUC MANUAL ENTRY: POCT Glucose (KUC): 112 mg/dL — AB (ref 70–99)

## 2023-11-09 LAB — CBG MONITORING, ED
Glucose-Capillary: 128 mg/dL — ABNORMAL HIGH (ref 70–99)
Glucose-Capillary: 230 mg/dL — ABNORMAL HIGH (ref 70–99)
Glucose-Capillary: 270 mg/dL — ABNORMAL HIGH (ref 70–99)

## 2023-11-09 LAB — TROPONIN I (HIGH SENSITIVITY)
Troponin I (High Sensitivity): 8 ng/L (ref <18)
Troponin I (High Sensitivity): 6 ng/L (ref <18)

## 2023-11-09 MED ORDER — METHYLPREDNISOLONE SODIUM SUCC 40 MG IJ SOLR
40.0000 mg | Freq: Once | INTRAMUSCULAR | Status: AC
  Administered 2023-11-09: 40 mg via INTRAVENOUS
  Filled 2023-11-09: qty 1

## 2023-11-09 MED ORDER — SIMVASTATIN 20 MG PO TABS
20.0000 mg | ORAL_TABLET | Freq: Every evening | ORAL | Status: DC
  Administered 2023-11-10: 20 mg via ORAL
  Filled 2023-11-09: qty 1

## 2023-11-09 MED ORDER — ALLOPURINOL 100 MG PO TABS: 300.0000 mg | ORAL_TABLET | Freq: Two times a day (BID) | ORAL | Status: DC

## 2023-11-09 MED ORDER — LEVOTHYROXINE SODIUM 25 MCG PO TABS
125.0000 ug | ORAL_TABLET | Freq: Every day | ORAL | Status: DC
  Administered 2023-11-10 – 2023-11-11 (×2): 125 ug via ORAL
  Filled 2023-11-09 (×2): qty 1

## 2023-11-09 MED ORDER — PANTOPRAZOLE SODIUM 40 MG PO TBEC
40.0000 mg | DELAYED_RELEASE_TABLET | Freq: Every day | ORAL | Status: DC
  Administered 2023-11-10 – 2023-11-11 (×2): 40 mg via ORAL
  Filled 2023-11-09 (×2): qty 1

## 2023-11-09 MED ORDER — POLYETHYLENE GLYCOL 3350 17 G PO PACK: 17.0000 g | PACK | Freq: Every day | ORAL | Status: DC | PRN

## 2023-11-09 MED ORDER — ALLOPURINOL 300 MG PO TABS
300.0000 mg | ORAL_TABLET | Freq: Every day | ORAL | Status: DC
  Administered 2023-11-10 – 2023-11-11 (×2): 300 mg via ORAL
  Filled 2023-11-09: qty 3
  Filled 2023-11-09: qty 1

## 2023-11-09 MED ORDER — ASPIRIN 81 MG PO TBEC
81.0000 mg | DELAYED_RELEASE_TABLET | Freq: Every day | ORAL | Status: DC
  Administered 2023-11-10 – 2023-11-11 (×2): 81 mg via ORAL
  Filled 2023-11-09 (×2): qty 1

## 2023-11-09 MED ORDER — ACETAMINOPHEN 650 MG RE SUPP: 650.0000 mg | Freq: Four times a day (QID) | RECTAL | Status: DC | PRN

## 2023-11-09 MED ORDER — LEVOTHYROXINE SODIUM 75 MCG PO TABS: 150.0000 ug | ORAL_TABLET | Freq: Every day | ORAL | Status: DC

## 2023-11-09 MED ORDER — DOCUSATE SODIUM 100 MG PO CAPS
100.0000 mg | ORAL_CAPSULE | Freq: Two times a day (BID) | ORAL | Status: DC
  Administered 2023-11-10 – 2023-11-11 (×3): 100 mg via ORAL
  Filled 2023-11-09 (×3): qty 1

## 2023-11-09 MED ORDER — INSULIN ASPART 100 UNIT/ML IJ SOLN
0.0000 [IU] | Freq: Three times a day (TID) | INTRAMUSCULAR | Status: DC
  Administered 2023-11-10: 3 [IU] via SUBCUTANEOUS
  Administered 2023-11-10: 2 [IU] via SUBCUTANEOUS
  Administered 2023-11-10 – 2023-11-11 (×3): 3 [IU] via SUBCUTANEOUS

## 2023-11-09 MED ORDER — INSULIN GLARGINE-YFGN 100 UNIT/ML ~~LOC~~ SOLN
8.0000 [IU] | Freq: Every day | SUBCUTANEOUS | Status: DC
  Administered 2023-11-10 – 2023-11-11 (×2): 8 [IU] via SUBCUTANEOUS
  Filled 2023-11-09 (×2): qty 0.08

## 2023-11-09 MED ORDER — DIPHENHYDRAMINE HCL 50 MG/ML IJ SOLN: 50.0000 mg | Freq: Once | INTRAMUSCULAR | Status: DC

## 2023-11-09 MED ORDER — ACETAMINOPHEN 325 MG PO TABS: 650.0000 mg | ORAL_TABLET | Freq: Four times a day (QID) | ORAL | Status: DC | PRN

## 2023-11-09 MED ORDER — ENOXAPARIN SODIUM 30 MG/0.3ML IJ SOSY
30.0000 mg | PREFILLED_SYRINGE | Freq: Every day | INTRAMUSCULAR | Status: DC
  Administered 2023-11-10 – 2023-11-11 (×2): 30 mg via SUBCUTANEOUS
  Filled 2023-11-09 (×2): qty 0.3

## 2023-11-09 MED ORDER — DIPHENHYDRAMINE HCL 25 MG PO CAPS: 50.0000 mg | ORAL_CAPSULE | Freq: Once | ORAL | Status: DC

## 2023-11-09 MED ORDER — SODIUM CHLORIDE 0.9 % IV BOLUS
1000.0000 mL | Freq: Once | INTRAVENOUS | Status: AC
  Administered 2023-11-09: 1000 mL via INTRAVENOUS

## 2023-11-09 MED ORDER — GABAPENTIN 400 MG PO CAPS
400.0000 mg | ORAL_CAPSULE | Freq: Two times a day (BID) | ORAL | Status: DC
  Administered 2023-11-10 – 2023-11-11 (×4): 400 mg via ORAL
  Filled 2023-11-09: qty 1
  Filled 2023-11-09 (×2): qty 4
  Filled 2023-11-09: qty 1

## 2023-11-09 NOTE — ED Provider Notes (Signed)
 [facility]    CSN: [id number] Arrival date & time: 11/09/23  1135      History   Chief Complaint Chief Complaint  Patient presents with   cramping    HPI Amy [name] is a 63 y.o. female.   Patient with complex medical history including type 1 diabetes, blindness, benign positional vertigo, chronic diastolic heart failure, etc. presents to urgent care with her caregiver and with her family member who contributed the history for evaluation of intermittent postural dizziness that started 2 to 3 days ago and paresthesias to the right face and bilateral hand cramping that started this morning. She has been intermittently short of breath since getting up from the bed to go to the bathroom this morning. Denies recent cough/fevers. History of asthma, she does not feel as though she is wheezing. Episodic decreased sensation to the right face this morning lasted for approximately 5-10 minutes then resolved on its own when she got out of bed.  Denies syncope, chest pain, heart palpitations, and orthopnea.  Denies changes in gait over the last few days, changes in speech, word finding, or visual disturbance. Denies unilateral extremity weakness, though she does report sensation that both of her legs are going to give out from underneath her when she stands up over the last few days.  States she tries to drink plenty of water.  Reports decreased appetite over the last 2 days as well.      Past Medical History:  Diagnosis Date   Arthritis    Asthma    Blind    Blindness and low vision    left eye glass eye,  legally blind in right eye   Diabetes mellitus    Diabetic neuropathy (HCC)    Hyperlipidemia    Hypertension    Hypothyroidism    Intractable nausea and vomiting 03/06/2018   Spinal stenosis     Patient Active Problem List   Diagnosis Date Noted   Abnormal breathing 03/07/2023   Nasal dryness 06/22/2021   Dysphagia 04/07/2021   Gastro-esophageal reflux disease  without esophagitis 04/07/2021   History of colonic polyps 04/07/2021   Bilateral impacted cerumen 02/25/2021   Sensorineural hearing loss (SNHL), bilateral 02/25/2021   Type 2 diabetes mellitus with diabetic polyneuropathy, with long-term current use of insulin  (HCC) 06/29/2020   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/29/2020   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, with long-term current use of insulin  (HCC) 06/29/2020   Type 2 diabetes mellitus with hyperglycemia, with long-term current use of insulin  (HCC) 06/29/2020   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 03/31/2020   Pre-operative cardiovascular examination 02/18/2019   Septic bursitis of elbow, right 10/30/2018   CRI (chronic renal insufficiency), stage 3 (moderate) 07/26/2018   Morbid obesity (HCC) 07/26/2018   Intractable nausea and vomiting 03/06/2018   Right leg pain 12/19/2017   Leukocytosis 12/19/2017   Benign positional vertigo 12/19/2017   Weakness 12/12/2017   Laryngopharyngeal reflux (LPR) 10/19/2017   Post-nasal drainage 10/19/2017   Acute sinusitis 10/19/2017   Degeneration of lumbar intervertebral disc 07/29/2017   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, left 03/08/2017   Obstructive sleep apnea on CPAP 02/09/2017   Chronic diastolic heart failure (HCC) 11/08/2016   Near syncope 01/30/2016   SOB (shortness of breath)    CAP (community  acquired pneumonia) 09/12/2015   Hypoxia 09/12/2015   Essential hypertension 07/05/2015   Hypotension 07/05/2015   Diabetes mellitus with neurological manifestations (HCC) 11/04/2014   Hyperlipidemia LDL goal <70 11/04/2014   Spinal stenosis, multilevel 11/04/2014   Hyperkalemia 11/01/2014   Hypoglycemia 08/09/2014   Type 2 diabetes mellitus with complication, with long-term  current use of insulin  (HCC) 08/08/2014   Elevated troponin 08/08/2014   Nausea vomiting and diarrhea 08/08/2014   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   Dizziness 08/06/2012   Orthostatic hypotension 08/06/2012   Hypernatremia 08/06/2012   Acute gastroenteritis 08/13/2011   Gastroparesis 08/13/2011   Low back pain 08/13/2011   Oral thrush 08/13/2011   Gastroenteritis 05/23/2011   Dehydration 05/23/2011   Blindness 05/23/2011   DM type 1, not at goal, causing eye disease (HCC) 05/23/2011   Asthma 05/23/2011   Diarrhea 05/23/2011   Vomiting 05/23/2011   Hypothyroidism 05/23/2011   Diabetic neuropathy (HCC) 05/23/2011   Diabetic nephropathy (HCC) 05/23/2011    Past Surgical History:  Procedure Laterality Date   ABDOMINAL HYSTERECTOMY  1990   BREAST BIOPSY Left    BREAST BIOPSY Right    BREAST BIOPSY Right 08/19/2022   US  RT BREAST BX W LOC DEV 1ST LESION IMG BX SPEC US  GUIDE 08/19/2022 [facility]   CESAREAN SECTION  1987, 1990   x 2   CHOLECYSTECTOMY  2008   COLONOSCOPY WITH PROPOFOL  N/A 05/05/2023   Procedure: COLONOSCOPY WITH PROPOFOL ;  Surgeon: Rollin Dover, MD;  Location: [facility];  Service: Gastroenterology;  Laterality: N/A;   ENUCLEATION Bilateral 09/15/1998   EYE SURGERY  2016   fitting artificial eye   POLYPECTOMY  05/05/2023   Procedure: POLYPECTOMY;  Surgeon: Rollin Dover, MD;  Location: [facility];  Service: Gastroenterology;;    OB History   No obstetric history on file.    Obstetric Comments  Pt has two sons.          Home Medications    Prior to Admission medications   Medication Sig Start Date End Date Taking? Authorizing Provider  albuterol  (PROVENTIL  HFA;VENTOLIN  HFA) 108 (90 BASE) MCG/ACT inhaler Inhale 2 puffs into the lungs every 4 (four) hours as needed for wheezing or shortness of breath. For short    [provider]  allopurinol (ZYLOPRIM) 300 MG tablet Take 300 mg by mouth  2 (two) times daily. 11/23/20   [provider]  aspirin  EC 81 MG tablet Take 81 mg by mouth daily.    [provider]  blood glucose meter kit and supplies Dispense based on patient and insurance preference. Use up to four times daily as directed. (FOR ICD-10 E10.9, E11.9). 08/06/20   Levora Reyes SAUNDERS, MD  Blood Glucose Monitoring Suppl (PRODIGY AUTOCODE BLOOD GLUCOSE) w/Device KIT 1 Device by Does not apply route daily in the afternoon. 09/21/23   Shamleffer, Ibtehal Jaralla, MD  celecoxib (CELEBREX) 200 MG capsule celecoxib 200 mg capsule TK 1 C PO BID 02/14/19   [provider]  cetirizine (ZYRTEC) 10 MG tablet Take 10 mg by mouth daily.    [provider]  clotrimazole -betamethasone  (LOTRISONE ) cream Apply 1 application topically 2 (two) times daily. 07/08/21   McDonald, Juliene SAUNDERS, DPM  colchicine 0.6 MG tablet Take by mouth. 12/14/20   [provider]  Continuous Blood Gluc Sensor (DEXCOM G7 SENSOR) MISC 1 Device by Does not apply route as directed. 10/19/21   Shamleffer, Ibtehal Jaralla, MD  dapagliflozin  propanediol (FARXIGA ) 10 MG TABS  tablet Take 1 tablet (10 mg total) by mouth daily. 09/21/23   Shamleffer, Ibtehal Jaralla, MD  docusate sodium  (COLACE) 100 MG capsule Take 100 mg by mouth 2 (two) times daily. 10/26/22   [provider]  Dulaglutide  (TRULICITY ) 3 MG/0.5ML SOAJ Inject 3 mg as directed once a week. 09/21/23   Shamleffer, Ibtehal Jaralla, MD  EMBRACE TALK GLUCOSE TEST test strip USE TO CHECK BLOOD SUGARS FOUR TIMES A DAY BEFORE MEALS AND AT BEDTIME DX E11.29 07/29/20   Levora Reyes SAUNDERS, MD  esomeprazole  (NEXIUM ) 40 MG capsule Take 1 capsule (40 mg total) by mouth daily. 07/10/20   Bensimhon, Toribio SAUNDERS, MD  fluticasone  (FLONASE ) 50 MCG/ACT nasal spray Place 2 sprays into both nostrils daily. 12/16/22   [provider]  gabapentin  (NEURONTIN ) 400 MG capsule Take 400 mg by mouth 3 (three) times daily. 08/10/21   [provider]   glucose blood (ONETOUCH VERIO) test strip Check blood sugar 1-2 times daily 09/21/23   Shamleffer, Ibtehal Jaralla, MD  hydrochlorothiazide (MICROZIDE) 12.5 MG capsule Take 12.5 mg by mouth. 08/29/23   [provider]  hydrOXYzine  (ATARAX /VISTARIL ) 25 MG tablet Take 0.5 tablets (12.5 mg total) by mouth every 8 (eight) hours as needed for itching. 12/05/20   Christopher Savannah, PA-C  insulin  degludec (TRESIBA  FLEXTOUCH) 200 UNIT/ML FlexTouch Pen Inject 8 Units into the skin daily at 6 (six) AM. 03/23/23   Shamleffer, Ibtehal Jaralla, MD  insulin  lispro (HUMALOG ) 100 UNIT/ML KwikPen MAX DAILY 30 UNITS 09/21/23   Shamleffer, Ibtehal Jaralla, MD  Insulin  Pen Needle 32G X 4 MM MISC 1 Device by Does not apply route in the morning, at noon, in the evening, and at bedtime. 09/21/23   Shamleffer, Ibtehal Jaralla, MD  levofloxacin  (LEVAQUIN ) 750 MG tablet Take 750 mg by mouth daily. Patient not taking: Reported on 09/21/2023 03/09/23   [provider]  levothyroxine  (SYNTHROID ) 150 MCG tablet Take 1 tablet (150 mcg total) by mouth daily. Needs appt for future refills 08/06/20   Levora Reyes SAUNDERS, MD  linaclotide  (LINZESS ) 145 MCG CAPS capsule Take 145 mcg by mouth daily as needed (constipation).    [provider]  losartan (COZAAR) 25 MG tablet Take 25 mg by mouth daily. 02/07/23   [provider]  naproxen  (NAPROSYN ) 500 MG tablet Take 500 mg by mouth 2 (two) times daily with a meal.    [provider]  nitrofurantoin , macrocrystal-monohydrate, (MACROBID ) 100 MG capsule Take 1 capsule (100 mg total) by mouth 2 (two) times daily. 11/06/21   Lynwood Lenis, PA-C  olmesartan (BENICAR) 20 MG tablet Take 10 mg by mouth daily. 04/26/21   [provider]  olmesartan (BENICAR) 40 MG tablet Take 40 mg by mouth daily. 02/01/23   [provider]  ondansetron  (ZOFRAN  ODT) 4 MG disintegrating tablet Take 1 tablet (4 mg total) by mouth every 8 (eight) hours as needed for nausea or  vomiting. 07/09/19   Keith Sor, PA-C  potassium chloride  SA (KLOR-CON ) 20 MEQ tablet Take 1 tablet (20 mEq total) by mouth daily. Need appt for future refills 08/06/20   Levora Reyes SAUNDERS, MD  Prodigy Twist Top Lancets 28G MISC USE TO CHECK BLOOD SUGARS 10/02/20   [provider]  simvastatin  (ZOCOR ) 20 MG tablet Take 1 tablet (20 mg total) by mouth every evening. 08/06/20   Levora Reyes SAUNDERS, MD  spironolactone  (ALDACTONE ) 25 MG tablet Take 0.5 tablets (12.5 mg total) by mouth daily. Needs appt for future refills 08/06/20  Levora Reyes SAUNDERS, MD  tiZANidine (ZANAFLEX) 2 MG tablet Take 2 mg by mouth 2 (two) times daily. 10/02/20   [provider]  torsemide  (DEMADEX ) 20 MG tablet Take 4 tablets (80 mg total) by mouth daily. Need appt for future refills 08/06/20   Levora Reyes SAUNDERS, MD    Family History Family History  Problem Relation Age of Onset   Diabetes Sister    Glaucoma Sister    Hypertension Sister    Healthy Mother    Healthy Father    Stroke Brother    Heart attack Paternal Aunt    Breast cancer Neg Hx     Social History Social History   Tobacco Use   Smoking status: Former    Current packs/day: 0.00    Types: Cigarettes    Quit date: 05/22/1992    Years since quitting: 31.4   Smokeless tobacco: Never  Vaping Use   Vaping status: Never Used  Substance Use Topics   Alcohol use: No    Comment: quit 20 yrs ago   Drug use: No     Allergies   Penicillins, Tramadol , Vicodin [hydrocodone -acetaminophen ], Codeine, Iodine-131, Iohexol, Lisinopril, and Sulfa antibiotics   Review of Systems Review of Systems Per HPI  Physical Exam Triage Vital Signs ED Triage Vitals [11/09/23 1157]  Encounter Vitals Group     BP 94/61     Girls Systolic BP Percentile      Girls Diastolic BP Percentile      Boys Systolic BP Percentile      Boys Diastolic BP Percentile      Pulse Rate 76     Resp (!) 24     Temp 98.1 F (36.7 C)     Temp Source Oral     SpO2 98 %      Weight      Height      Head Circumference      Peak Flow      Pain Score      Pain Loc      Pain Education      Exclude from Growth Chart    No data found.  Updated Vital Signs BP 94/61 (BP Location: Left Arm)   Pulse 76   Temp 98.1 F (36.7 C) (Oral)   Resp (!) 24   SpO2 98%   Visual Acuity Right Eye Distance:   Left Eye Distance:   Bilateral Distance:    Right Eye Near:   Left Eye Near:    Bilateral Near:     Physical Exam Vitals and nursing note reviewed.  Constitutional:      Appearance: She is ill-appearing. She is not toxic-appearing.  HENT:     Head: Normocephalic and atraumatic.     Right Ear: Hearing and external ear normal.     Left Ear: Hearing and external ear normal.     Nose: Nose normal.     Mouth/Throat:     Lips: Pink.   Eyes:     General: Lids are normal. Vision grossly intact. Gaze aligned appropriately.     Extraocular Movements: Extraocular movements intact.     Conjunctiva/sclera: Conjunctivae normal.    Cardiovascular:     Rate and Rhythm: Normal rate and regular rhythm.     Heart sounds: Normal heart sounds, S1 normal and S2 normal.  Pulmonary:     Effort: Pulmonary effort is normal. Tachypnea present. No accessory muscle usage, respiratory distress or retractions.     Breath sounds: Normal breath  sounds and air entry. No decreased air movement. No decreased breath sounds, wheezing, rhonchi or rales.     Comments: Appears short of breath seated in wheelchair. Speaking in short sentences with subtle increased effort.   Musculoskeletal:     Cervical back: Neck supple.   Skin:    General: Skin is warm and dry.     Capillary Refill: Capillary refill takes less than 2 seconds.     Findings: No rash.   Neurological:     General: No focal deficit present.     Mental Status: She is alert and oriented to person, place, and time. Mental status is at baseline.     Cranial Nerves: Cranial nerves 2-12 are intact. No cranial nerve  deficit, dysarthria or facial asymmetry (no facial asmetry today).     Sensory: Sensation is intact. No sensory deficit.     Motor: Motor function is intact. No weakness, tremor, abnormal muscle tone or pronator drift.     Coordination: Coordination is intact. Romberg sign negative. Coordination normal. Finger-Nose-Finger Test normal.     Gait: Gait abnormal.     Deep Tendon Reflexes: Reflexes normal.     Comments: Strength and sensation intact to bilateral upper and lower extremities (5/5). Moves all 4 extremities with normal coordination voluntarily. Non-focal neuro exam.   Psychiatric:        Mood and Affect: Mood normal.        Speech: Speech normal.        Behavior: Behavior normal.        Thought Content: Thought content normal.        Judgment: Judgment normal.      UC Treatments / Results  Labs (all labs ordered are listed, but only abnormal results are displayed) Labs Reviewed  POCT FASTING CBG [facility] MANUAL ENTRY - Abnormal; Notable for the following components:      Result Value   POCT Glucose ([facility]) 112 (*)    All other components within normal limits    EKG   Radiology No results found.  Procedures Procedures (including critical care time)  Medications Ordered in UC Medications - No data to display  Initial Impression / Assessment and Plan / UC Course  I have reviewed the triage vital signs and the nursing notes.  Pertinent labs & imaging results that were available during my care of the patient were reviewed by me and considered in my medical decision making (see chart for details).   1.  Shortness of breath, hypotension, postural dizziness Patient with tachypnea and soft blood pressure, afebrile, no new oxygen  requirement.  Lungs are clear despite mildly increased work of breathing.  Respirations are not consistent with kussmaul type respirations. CBGs 112 here. Recommend further workup and evaluation in the emergency department to rule out electrolyte  imbalance in the setting of acute dehydration contributing to postural dizziness and shortness of breath. She is stable for transport to the nearest emergency department with her family member.   Discussed clinical concerns/exam findings leading to recommendation for further workup in the ER setting and risks of deferring ER visit with patient/family. Patient/family express understanding and agreement with plan, discharged to ER via private car. Offered EMS transport, family and patient declined.     Final Clinical Impressions(s) / UC Diagnoses   Final diagnoses:  Shortness of breath  Hypotension, unspecified hypotension type  Postural dizziness   Discharge Instructions   None    ED Prescriptions   None    PDMP not reviewed  this encounter.   Amy [name], [place] 11/09/23 1313

## 2023-11-09 NOTE — ED Notes (Signed)
 Left arm issues for 2 weeks.  Fingers cramping into a fist and unable to straighten them.    Noted right side of face drawing.  Reports this started around 10 this morning.  Patient complained of bilateral arm twitching and cramping.  Also, face is no longer drawing.  Bilateral hand cramping noted by care giver

## 2023-11-09 NOTE — ED Triage Notes (Signed)
 Pt is blind. She reports she went to urgent care today because she was having left arm pain x 3 days and dizziness. She was sent here to the ER for shortness of breath, hypotension and postural dizziness to rule out electrolyte imbalance and dehydration.

## 2023-11-09 NOTE — ED Notes (Signed)
 Patient is out of gabapentin , unknown period of time

## 2023-11-09 NOTE — H&P (Addendum)
 History and Physical    Amy [name] FMW:[id number] DOB: Feb 11, 1961 DOA: 11/09/2023  Patient coming from: Home.  Chief Complaint: Shortness of breath.  HPI: Amy [name] is a 63 y.o. female with history of chronic diastolic CHF, hypertension, diabetes mellitus type 2, blindness, sleep apnea, chronic kidney disease stage III presents to the ER with complaints of shortness of breath.  Patient states she has been getting short of breath with minimal exertion over the last 1 month with nonproductive cough.  Denies any chest pain fever or chills.  ED Course: In the ER troponins and BNP were unremarkable.  Chest x-ray did not show any acute infiltrates.  Patient appeared visibly tachypneic.  D-dimer was minimally elevated at 0.9 but given the history of worsening renal function and patient's allergies to contrast radiology recommended VQ scan which has been ordered for the morning.  Creatinine is around 1.8 increased from 1.2 last month.   Review of Systems: As per HPI, rest all negative.   Past Medical History:  Diagnosis Date   Arthritis    Asthma    Blind    Blindness and low vision    left eye glass eye,  legally blind in right eye   Diabetes mellitus    Diabetic neuropathy (HCC)    Hyperlipidemia    Hypertension    Hypothyroidism    Intractable nausea and vomiting 03/06/2018   Spinal stenosis     Past Surgical History:  Procedure Laterality Date   ABDOMINAL HYSTERECTOMY  1990   BREAST BIOPSY Left    BREAST BIOPSY Right    BREAST BIOPSY Right 08/19/2022   US  RT BREAST BX W LOC DEV 1ST LESION IMG BX SPEC US  GUIDE 08/19/2022 [facility]   CESAREAN SECTION  1987, 1990   x 2   CHOLECYSTECTOMY  2008   COLONOSCOPY WITH PROPOFOL  N/A 05/05/2023   Procedure: COLONOSCOPY WITH PROPOFOL ;  Surgeon: Rollin Dover, MD;  Location: [facility];  Service: Gastroenterology;  Laterality: N/A;   ENUCLEATION Bilateral 09/15/1998   EYE SURGERY  2016   fitting artificial eye    POLYPECTOMY  05/05/2023   Procedure: POLYPECTOMY;  Surgeon: Rollin Dover, MD;  Location: [facility];  Service: Gastroenterology;;     reports that she quit smoking about 31 years ago. Her smoking use included cigarettes. She has never used smokeless tobacco. She reports that she does not drink alcohol and does not use drugs.  Allergies  Allergen Reactions   Penicillins Hives and Swelling    Has patient had a PCN reaction causing immediate rash, facial/tongue/throat swelling, SOB or lightheadedness with hypotension: yes- face swelling Has patient had a PCN reaction causing severe rash involving mucus membranes or skin necrosis: no Has patient had a PCN reaction that required hospitalization unknown (childhood allergy) Has patient had a PCN reaction occurring within the last 10 years: no If all of the above answers are NO, then may proceed with Cephalosporin use.    Tramadol  Nausea And Vomiting    Intense nausea   Vicodin [Hydrocodone -Acetaminophen ] Nausea And Vomiting   Codeine Nausea Only    Other reaction(s): Unknown   Iodine-131 Hives   Iohexol Hives    Pt developed itching and hives along with nasal congestion; needs 13 hour premeds for future studies, Onset Date: [id number]    Lisinopril Cough    Other reaction(s): Unknown   Sulfa Antibiotics Nausea And Vomiting    Family History  Problem Relation Age of Onset   Diabetes Sister  Glaucoma Sister    Hypertension Sister    Healthy Mother    Healthy Father    Stroke Brother    Heart attack Paternal Aunt    Breast cancer Neg Hx     Prior to Admission medications   Medication Sig Start Date End Date Taking? Authorizing Provider  albuterol  (PROVENTIL  HFA;VENTOLIN  HFA) 108 (90 BASE) MCG/ACT inhaler Inhale 2 puffs into the lungs every 4 (four) hours as needed for wheezing or shortness of breath. For short   Yes [provider]  allopurinol  (ZYLOPRIM ) 300 MG tablet Take 300 mg by mouth 2 (two) times daily. 11/23/20   Yes [provider]  aspirin  EC 81 MG tablet Take 81 mg by mouth daily.   Yes [provider]  celecoxib (CELEBREX) 200 MG capsule Take 200 mg by mouth 2 (two) times daily. 02/14/19  Yes [provider]  cetirizine (ZYRTEC) 10 MG tablet Take 10 mg by mouth at bedtime.   Yes [provider]  Continuous Blood Gluc Sensor (DEXCOM G7 SENSOR) MISC 1 Device by Does not apply route as directed. 10/19/21  Yes Shamleffer, Ibtehal Jaralla, MD  dapagliflozin  propanediol (FARXIGA ) 10 MG TABS tablet Take 1 tablet (10 mg total) by mouth daily. 09/21/23  Yes Shamleffer, Ibtehal Jaralla, MD  docusate sodium  (COLACE) 100 MG capsule Take 100 mg by mouth 2 (two) times daily. 10/26/22  Yes [provider]  Dulaglutide  (TRULICITY ) 3 MG/0.5ML SOAJ Inject 3 mg as directed once a week. Patient taking differently: Inject 3 mg as directed once a week. On fridays 09/21/23  Yes Shamleffer, Ibtehal Jaralla, MD  esomeprazole  (NEXIUM ) 40 MG capsule Take 1 capsule (40 mg total) by mouth daily. 07/10/20  Yes Bensimhon, Toribio SAUNDERS, MD  fluticasone  (FLONASE ) 50 MCG/ACT nasal spray Place 2 sprays into both nostrils daily. 12/16/22  Yes [provider]  gabapentin  (NEURONTIN ) 400 MG capsule Take 400 mg by mouth 2 (two) times daily. 08/10/21  Yes [provider]  hydrOXYzine  (ATARAX /VISTARIL ) 25 MG tablet Take 0.5 tablets (12.5 mg total) by mouth every 8 (eight) hours as needed for itching. 12/05/20  Yes Christopher Savannah, PA-C  insulin  degludec (TRESIBA  FLEXTOUCH) 200 UNIT/ML FlexTouch Pen Inject 8 Units into the skin daily at 6 (six) AM. Patient taking differently: Inject 8 Units into the skin at bedtime. 03/23/23  Yes Shamleffer, Ibtehal Jaralla, MD  insulin  lispro (HUMALOG ) 100 UNIT/ML KwikPen MAX DAILY 30 UNITS Patient taking differently: Inject 0-30 Units into the skin 3 (three) times daily. PER SLIDING SCALE MAX DAILY 30 UNITS 09/21/23  Yes Shamleffer, Ibtehal Jaralla, MD  levothyroxine   (SYNTHROID ) 150 MCG tablet Take 1 tablet (150 mcg total) by mouth daily. Needs appt for future refills 08/06/20  Yes Levora Reyes SAUNDERS, MD  naproxen  (NAPROSYN ) 500 MG tablet Take 500 mg by mouth 2 (two) times daily with a meal.   Yes [provider]  olmesartan (BENICAR) 40 MG tablet Take 40 mg by mouth daily. 02/01/23  Yes [provider]  polyethylene glycol powder (MIRALAX ) 17 GM/SCOOP powder Take 17 g by mouth daily as needed for moderate constipation. 08/03/23  Yes [provider]  simvastatin  (ZOCOR ) 20 MG tablet Take 1 tablet (20 mg total) by mouth every evening. 08/06/20  Yes Levora Reyes SAUNDERS, MD  blood glucose meter kit and supplies Dispense based on patient and insurance preference. Use up to four times daily as directed. (FOR ICD-10 E10.9, E11.9). 08/06/20   Levora Reyes SAUNDERS, MD  Blood Glucose Monitoring Suppl (PRODIGY  AUTOCODE BLOOD GLUCOSE) w/Device KIT 1 Device by Does not apply route daily in the afternoon. 09/21/23   Shamleffer, Ibtehal Jaralla, MD  EMBRACE TALK GLUCOSE TEST test strip USE TO CHECK BLOOD SUGARS FOUR TIMES A DAY BEFORE MEALS AND AT BEDTIME DX E11.29 07/29/20   Levora Reyes SAUNDERS, MD  glucose blood [facility] VERIO) test strip Check blood sugar 1-2 times daily 09/21/23   Shamleffer, Ibtehal Jaralla, MD  hydrochlorothiazide (MICROZIDE) 12.5 MG capsule Take 12.5 mg by mouth. Patient not taking: Reported on 11/09/2023 08/29/23   [provider]  Insulin  Pen Needle 32G X 4 MM MISC 1 Device by Does not apply route in the morning, at noon, in the evening, and at bedtime. 09/21/23   Shamleffer, Donell Cardinal, MD  Prodigy Twist Top Lancets 28G MISC USE TO CHECK BLOOD SUGARS 10/02/20   [provider]    Physical Exam: Constitutional: Moderately built and nourished. Vitals:   11/09/23 1721 11/09/23 1900 11/09/23 2000 11/09/23 2300  BP: (!) 148/90 (!) 119/105 136/66 (!) 125/58  Pulse: 65 70 76 70  Resp: 18 19 17 13   Temp: 98.1 F (36.7 C)      TempSrc: Oral     SpO2: 100% 100% 100% 96%  Weight:      Height:       Eyes: Anicteric no pallor. ENMT: No discharge from the ears or his nose or mouth. Neck: No mass felt.  No neck rigidity. Respiratory: No rhonchi or crepitations. Cardiovascular: S1-S2 heard. Abdomen: Soft nontender bowel sound present. Musculoskeletal: No edema. Skin: No rash. Neurologic: Alert awake oriented to time place and person.  Moves all extremities.  Blind in both eyes. Psychiatric: Appears normal.  Normal affect.   Labs on Admission: I have personally reviewed following labs and imaging studies  CBC: Recent Labs  Lab 11/09/23 1319  WBC 11.1*  HGB 13.2  HCT 41.1  MCV 80.9  PLT 253   Basic Metabolic Panel: Recent Labs  Lab 11/09/23 1319  NA 144  K 4.2  CL 110  CO2 25  GLUCOSE 136*  BUN 25*  CREATININE 1.84*  CALCIUM  9.5   GFR: Estimated Creatinine Clearance: 28.6 mL/min (A) (by C-G formula based on SCr of 1.84 mg/dL (H)). Liver Function Tests: Recent Labs  Lab 11/09/23 1319  AST 17  ALT 13  ALKPHOS 91  BILITOT 0.2  PROT 6.9  ALBUMIN 3.3*   No results for input(s): LIPASE, AMYLASE in the last 168 hours. No results for input(s): AMMONIA in the last 168 hours. Coagulation Profile: No results for input(s): INR, PROTIME in the last 168 hours. Cardiac Enzymes: No results for input(s): CKTOTAL, CKMB, CKMBINDEX, TROPONINI in the last 168 hours. BNP (last 3 results) No results for input(s): PROBNP in the last 8760 hours. HbA1C: No results for input(s): HGBA1C in the last 72 hours. CBG: Recent Labs  Lab 11/09/23 1434 11/09/23 2134  GLUCAP 128* 230*   Lipid Profile: No results for input(s): CHOL, HDL, LDLCALC, TRIG, CHOLHDL, LDLDIRECT in the last 72 hours. Thyroid  Function Tests: No results for input(s): TSH, T4TOTAL, FREET4, T3FREE, THYROIDAB in the last 72 hours. Anemia Panel: No results for input(s): VITAMINB12, FOLATE,  FERRITIN, TIBC, IRON, RETICCTPCT in the last 72 hours. Urine analysis:    Component Value Date/Time   COLORURINE YELLOW 04/17/2022 0430   APPEARANCEUR CLEAR 04/17/2022 0430   LABSPEC 1.010 04/17/2022 0430   PHURINE 6.0 04/17/2022 0430   GLUCOSEU >=500 (A) 04/17/2022 0430   HGBUR NEGATIVE 04/17/2022 0430  BILIRUBINUR NEGATIVE 04/17/2022 0430   KETONESUR NEGATIVE 04/17/2022 0430   PROTEINUR TRACE (A) 04/17/2022 0430   UROBILINOGEN 0.2 11/06/2021 1202   NITRITE NEGATIVE 04/17/2022 0430   LEUKOCYTESUR NEGATIVE 04/17/2022 0430   Sepsis Labs: @LABRCNTIP (procalcitonin:4,lacticidven:4) )No results found for this or any previous visit (from the past 240 hours).   Radiological Exams on Admission: DG Chest 2 View Result Date: 11/09/2023 CLINICAL DATA:  sob, dizziness EXAM: CHEST - 2 VIEW COMPARISON:  April 23, 2020 FINDINGS: No focal airspace consolidation, pleural effusion, or pneumothorax. No cardiomegaly.No acute fracture or destructive lesion. IMPRESSION: No acute cardiopulmonary abnormality. Electronically Signed   By: Rogelia Myers M.D.   On: 11/09/2023 14:55    EKG: Independently reviewed.  Normal sinus rhythm.  Assessment/Plan Principal Problem:   SOB (shortness of breath) Active Problems:   Blindness   DM type 1, not at goal, causing eye disease (HCC)   Hypothyroidism   Diabetic neuropathy (HCC)   Essential hypertension   Chronic diastolic heart failure (HCC)    Shortness of breath cause not clear.  Patient visibly appears tachypneic.  Patient is not requiring oxygen  at this time.  Has nonproductive cough.  D-dimer is minimally elevated VQ scan ordered.  Will also check CT chest without contrast.  2D echo done on July 2024 showed EF of 60 to 65% with grade 1 diastolic dysfunction.  At this time appears dehydrated. Diabetes mellitus type 2 on insulin  glargine Trulicity  and Farxiga .  Last hemoglobin A1c was 6.9 today.  Continue insulin  glargine and sliding scale  coverage. Hypertension holding ARB because of worsening renal function.  As needed IV hydralazine .  Restart ARB after creatinine improves. Acute on chronic kidney disease stage III creatinine mildly elevated from last month.  Patient received 1 L fluid bolus in the ER.  Repeat metabolic panel.  Holding ARB for now. Diabetic neuropathy on gabapentin .  Dose needs to be decreased if creatinine worsens. Hyperlipidemia on statins GERD on PPI. History of diastolic CHF see #1. Blindness. Sleep apnea on CPAP at bedtime. Gout on allopurinol . Hypothyroidism on Synthroid .  Since patient has shortness of breath with tachypnea and cough will need further workup and more than 2 midnight stay.   DVT prophylaxis: Lovenox . Code Status: Full code. Family Communication: Discussed with patient. Disposition Plan: Monitored bed. Consults called: None. Admission status: Observation.

## 2023-11-09 NOTE — ED Notes (Signed)
 Checked patient cbg it was 128 patient is resting with call bell in reach

## 2023-11-09 NOTE — ED Notes (Addendum)
 Pt reports dizziness and lightheaded during orthostatic VS.  Attempt at UA was unsuccessful.

## 2023-11-09 NOTE — ED Notes (Signed)
 Patient is being discharged from the Urgent Care and sent to the Emergency Department via POV . Per Rosaline Silk, NP, patient is in need of higher level of care due to limited resources. Patient is aware and verbalizes understanding of plan of care.  Vitals:   11/09/23 1157  BP: 94/61  Pulse: 76  Resp: (!) 24  Temp: 98.1 F (36.7 C)  SpO2: 98%

## 2023-11-09 NOTE — ED Provider Notes (Signed)
 Pt seen by Dr Bari.  Please see her note  CT angio pending to rule out PE.  However patient's GFR is 30 and radiology states she is borderline per their protocol.  Patient also needs to have a 13-hour contrast allergy protocol.  At this point it may be better to do a VQ scan.  I will consult the medical service for admission and further evaluation.  Case discussed with Dr Franky regarding admission   Randol Simmonds, MD 11/09/23 1919

## 2023-11-09 NOTE — ED Notes (Signed)
 Patient transported to X-ray

## 2023-11-09 NOTE — ED Notes (Signed)
Per MD ok for pt to eat, pt given sandwich.

## 2023-11-09 NOTE — ED Provider Notes (Addendum)
 [place] EMERGENCY DEPARTMENT AT [facility] Provider Note   CSN: [id number] Arrival date & time: 11/09/23  1256     Patient presents with: Shortness of Breath   Amy [name] is a 63 y.o. female.   HPI   63 year old female who is blind presents to the emergency department with concern for postural dizziness, shortness of breath.  Patient states she has been feeling lightheaded when changing positions for the past couple days, starting last night and into today she has been feeling short of breath. SOB is present even at rest and with conversation.  No coughing/hemoptysis or chest pain.  She also endorses intermittent cramping of her bilateral hands and muscles of the face.  States that these episodes are brief and currently resolved. No focal numbness.  Patient was sent here from urgent care with concern for hypotension and tachypnea.  Prior to Admission medications   Medication Sig Start Date End Date Taking? Authorizing Provider  albuterol  (PROVENTIL  HFA;VENTOLIN  HFA) 108 (90 BASE) MCG/ACT inhaler Inhale 2 puffs into the lungs every 4 (four) hours as needed for wheezing or shortness of breath. For short    [provider]  allopurinol (ZYLOPRIM) 300 MG tablet Take 300 mg by mouth 2 (two) times daily. 11/23/20   [provider]  aspirin  EC 81 MG tablet Take 81 mg by mouth daily.    [provider]  blood glucose meter kit and supplies Dispense based on patient and insurance preference. Use up to four times daily as directed. (FOR ICD-10 E10.9, E11.9). 08/06/20   Levora Reyes SAUNDERS, MD  Blood Glucose Monitoring Suppl (PRODIGY AUTOCODE BLOOD GLUCOSE) w/Device KIT 1 Device by Does not apply route daily in the afternoon. 09/21/23   Shamleffer, Ibtehal Jaralla, MD  celecoxib (CELEBREX) 200 MG capsule celecoxib 200 mg capsule TK 1 C PO BID 02/14/19   [provider]  cetirizine (ZYRTEC) 10 MG tablet Take 10 mg by mouth daily.    [provider]  clotrimazole -betamethasone  (LOTRISONE ) cream Apply 1 application topically 2 (two) times daily. 07/08/21   McDonald, Juliene SAUNDERS, DPM  colchicine 0.6 MG tablet Take by mouth. 12/14/20   [provider]  Continuous Blood Gluc Sensor (DEXCOM G7 SENSOR) MISC 1 Device by Does not apply route as directed. 10/19/21   Shamleffer, Ibtehal Jaralla, MD  dapagliflozin  propanediol (FARXIGA ) 10 MG TABS tablet Take 1 tablet (10 mg total) by mouth daily. 09/21/23   Shamleffer, Ibtehal Jaralla, MD  docusate sodium  (COLACE) 100 MG capsule Take 100 mg by mouth 2 (two) times daily. 10/26/22   [provider]  Dulaglutide  (TRULICITY ) 3 MG/0.5ML SOAJ Inject 3 mg as directed once a week. 09/21/23   Shamleffer, Ibtehal Jaralla, MD  EMBRACE TALK GLUCOSE TEST test strip USE TO CHECK BLOOD SUGARS FOUR TIMES A DAY BEFORE MEALS AND AT BEDTIME DX E11.29 07/29/20   Levora Reyes SAUNDERS, MD  esomeprazole  (NEXIUM ) 40 MG capsule Take 1 capsule (40 mg total) by mouth daily. 07/10/20   Bensimhon, Toribio SAUNDERS, MD  fluticasone  (FLONASE ) 50 MCG/ACT nasal spray Place 2 sprays into both nostrils daily. 12/16/22   [provider]  gabapentin  (NEURONTIN ) 400 MG capsule Take 400 mg by mouth 3 (three) times daily. 08/10/21   [provider]  glucose blood (ONETOUCH VERIO) test strip Check blood sugar 1-2 times daily 09/21/23   Shamleffer, Ibtehal Jaralla, MD  hydrochlorothiazide (MICROZIDE) 12.5 MG capsule Take 12.5 mg by mouth. 08/29/23   [provider]  hydrOXYzine  (  ATARAX /VISTARIL ) 25 MG tablet Take 0.5 tablets (12.5 mg total) by mouth every 8 (eight) hours as needed for itching. 12/05/20   Christopher Savannah, PA-C  insulin  degludec (TRESIBA  FLEXTOUCH) 200 UNIT/ML FlexTouch Pen Inject 8 Units into the skin daily at 6 (six) AM. 03/23/23   Shamleffer, Ibtehal Jaralla, MD  insulin  lispro (HUMALOG ) 100 UNIT/ML KwikPen MAX DAILY 30 UNITS 09/21/23   Shamleffer, Ibtehal Jaralla, MD  Insulin  Pen Needle 32G X 4 MM MISC 1 Device by Does not  apply route in the morning, at noon, in the evening, and at bedtime. 09/21/23   Shamleffer, Ibtehal Jaralla, MD  levofloxacin  (LEVAQUIN ) 750 MG tablet Take 750 mg by mouth daily. Patient not taking: Reported on 09/21/2023 03/09/23   [provider]  levothyroxine  (SYNTHROID ) 150 MCG tablet Take 1 tablet (150 mcg total) by mouth daily. Needs appt for future refills 08/06/20   Levora Reyes SAUNDERS, MD  linaclotide  (LINZESS ) 145 MCG CAPS capsule Take 145 mcg by mouth daily as needed (constipation).    [provider]  losartan (COZAAR) 25 MG tablet Take 25 mg by mouth daily. 02/07/23   [provider]  naproxen  (NAPROSYN ) 500 MG tablet Take 500 mg by mouth 2 (two) times daily with a meal.    [provider]  nitrofurantoin , macrocrystal-monohydrate, (MACROBID ) 100 MG capsule Take 1 capsule (100 mg total) by mouth 2 (two) times daily. 11/06/21   Lynwood Lenis, PA-C  olmesartan (BENICAR) 20 MG tablet Take 10 mg by mouth daily. 04/26/21   [provider]  olmesartan (BENICAR) 40 MG tablet Take 40 mg by mouth daily. 02/01/23   [provider]  ondansetron  (ZOFRAN  ODT) 4 MG disintegrating tablet Take 1 tablet (4 mg total) by mouth every 8 (eight) hours as needed for nausea or vomiting. 07/09/19   Keith Sor, PA-C  potassium chloride  SA (KLOR-CON ) 20 MEQ tablet Take 1 tablet (20 mEq total) by mouth daily. Need appt for future refills 08/06/20   Levora Reyes SAUNDERS, MD  Prodigy Twist Top Lancets 28G MISC USE TO CHECK BLOOD SUGARS 10/02/20   [provider]  simvastatin  (ZOCOR ) 20 MG tablet Take 1 tablet (20 mg total) by mouth every evening. 08/06/20   Levora Reyes SAUNDERS, MD  spironolactone  (ALDACTONE ) 25 MG tablet Take 0.5 tablets (12.5 mg total) by mouth daily. Needs appt for future refills 08/06/20   Levora Reyes SAUNDERS, MD  tiZANidine (ZANAFLEX) 2 MG tablet Take 2 mg by mouth 2 (two) times daily. 10/02/20   [provider]  torsemide  (DEMADEX ) 20 MG tablet  Take 4 tablets (80 mg total) by mouth daily. Need appt for future refills 08/06/20   Levora Reyes SAUNDERS, MD    Allergies: Penicillins, Tramadol , Vicodin [hydrocodone -acetaminophen ], Codeine, Iodine-131, Iohexol, Lisinopril, and Sulfa antibiotics    Review of Systems  Constitutional:  Positive for fatigue. Negative for fever.  Respiratory:  Positive for shortness of breath.   Cardiovascular:  Negative for chest pain.  Gastrointestinal:  Negative for abdominal pain, diarrhea and vomiting.  Musculoskeletal:        Muscle cramping  Skin:  Negative for rash.  Neurological:  Positive for light-headedness. Negative for headaches.    Updated Vital Signs BP 126/89   Pulse 62   Temp 99 F (37.2 C)   Resp (!) 21   Ht 4' 11 (1.499 m)   Wt 81.6 kg   SpO2 100%   BMI 36.36 kg/m   Physical Exam Vitals and nursing note reviewed.  Constitutional:  General: She is not in acute distress.    Appearance: Normal appearance.  HENT:     Head: Normocephalic.     Mouth/Throat:     Mouth: Mucous membranes are moist.   Cardiovascular:     Rate and Rhythm: Normal rate.  Pulmonary:     Effort: Tachypnea present. No respiratory distress.     Breath sounds: Examination of the right-lower field reveals decreased breath sounds. Examination of the left-lower field reveals decreased breath sounds. Decreased breath sounds present.  Abdominal:     Palpations: Abdomen is soft.     Tenderness: There is no abdominal tenderness.   Musculoskeletal:     Right lower leg: No edema.     Left lower leg: No edema.   Skin:    General: Skin is warm.   Neurological:     Mental Status: She is alert and oriented to person, place, and time. Mental status is at baseline.   Psychiatric:        Mood and Affect: Mood normal.     (all labs ordered are listed, but only abnormal results are displayed) Labs Reviewed  COMPREHENSIVE METABOLIC PANEL WITH GFR - Abnormal; Notable for the following components:       Result Value   Glucose, Bld 136 (*)    BUN 25 (*)    Creatinine, Ser 1.84 (*)    Albumin 3.3 (*)    GFR, Estimated 30 (*)    All other components within normal limits  CBC - Abnormal; Notable for the following components:   WBC 11.1 (*)    RDW 16.7 (*)    All other components within normal limits  CBG MONITORING, ED - Abnormal; Notable for the following components:   Glucose-Capillary 128 (*)    All other components within normal limits  URINALYSIS, ROUTINE W REFLEX MICROSCOPIC    EKG: EKG Interpretation Date/Time:  Thursday November 09 2023 13:23:13 EDT Ventricular Rate:  72 PR Interval:  116 QRS Duration:  68 QT Interval:  398 QTC Calculation: 435 R Axis:   42  Text Interpretation: Unusual P axis and short PR, probable junctional rhythm Nonspecific T wave abnormality Abnormal ECG When compared with ECG of 19-Feb-2023 23:23, PREVIOUS ECG IS PRESENT similar to previous Confirmed by Bari Flank [phone]) on 11/09/2023 1:47:10 PM  Radiology: No results found.   Procedures   Medications Ordered in the ED - No data to display                                  Medical Decision Making Amount and/or Complexity of Data Reviewed Labs: ordered. Radiology: ordered.  Risk Prescription drug management.   63 year old female presents to the emergency department with concern for shortness of breath even at rest, lightheadedness for the past 3 days. Also cramping. She is tachypneic and conversationally short of breath even at rest but no hypoxia or distress.  She is a minimal historian.  Was seen at urgent care and sent here for further workup.  Now also reveals that she is been having intermittent left arm pain.  Does not radiate to the jaw or chest.  EKG shows no ischemic changes.  CBC has a white count of 11.1.  CMP shows kidney function slightly elevated from baseline.  Chest x-ray shows no acute finding.  Patient became very dizzy, SOB with orthostatic vitals. IV fluids  ordered.  Troponin is 8.  Dimer came back elevated,  cannot age-adjusted.  At this time patient will get IV hydration as well as a CT PE study.  Patient signed out pending results and re eval.     Final diagnoses:  None    ED Discharge Orders     None          Bari Roxie HERO, DO 11/09/23 1615    Lilee Aldea, Roxie HERO, DO 11/09/23 1631    Tobey Schmelzle, Roxie HERO, DO 11/09/23 1650

## 2023-11-10 ENCOUNTER — Observation Stay (HOSPITAL_COMMUNITY)

## 2023-11-10 DIAGNOSIS — R0602 Shortness of breath: Secondary | ICD-10-CM | POA: Diagnosis not present

## 2023-11-10 DIAGNOSIS — N179 Acute kidney failure, unspecified: Secondary | ICD-10-CM | POA: Insufficient documentation

## 2023-11-10 LAB — URINALYSIS, ROUTINE W REFLEX MICROSCOPIC
Bilirubin Urine: NEGATIVE
Glucose, UA: >=500 mg/dL — AB
Hgb urine dipstick: NEGATIVE
Ketones, ur: NEGATIVE mg/dL
Leukocytes,Ua: NEGATIVE
Nitrite: NEGATIVE
Protein, ur: 100 mg/dL — AB
Specific Gravity, Urine: 1.012 (ref 1.005–1.030)
pH: 5 (ref 5.0–8.0)

## 2023-11-10 LAB — CBC
HCT: 42.2 % (ref 36.0–46.0)
HCT: 38.1 % (ref 36.0–46.0)
Hemoglobin: 13.4 g/dL (ref 12.0–15.0)
Hemoglobin: 12.2 g/dL (ref 12.0–15.0)
MCH: 26.3 pg (ref 26.0–34.0)
MCH: 26 pg (ref 26.0–34.0)
MCHC: 31.8 g/dL (ref 30.0–36.0)
MCHC: 32 g/dL (ref 30.0–36.0)
MCV: 82.7 fL (ref 80.0–100.0)
MCV: 81.1 fL (ref 80.0–100.0)
Platelets: 235 10*3/uL (ref 150–400)
Platelets: 218 10*3/uL (ref 150–400)
RBC: 5.1 MIL/uL (ref 3.87–5.11)
RBC: 4.7 MIL/uL (ref 3.87–5.11)
RDW: 16.8 % — ABNORMAL HIGH (ref 11.5–15.5)
RDW: 16.6 % — ABNORMAL HIGH (ref 11.5–15.5)
WBC: 8.9 10*3/uL (ref 4.0–10.5)
WBC: 9.7 10*3/uL (ref 4.0–10.5)
nRBC: 0 % (ref 0.0–0.2)
nRBC: 0 % (ref 0.0–0.2)

## 2023-11-10 LAB — CBG MONITORING, ED
Glucose-Capillary: 239 mg/dL — ABNORMAL HIGH (ref 70–99)
Glucose-Capillary: 214 mg/dL — ABNORMAL HIGH (ref 70–99)

## 2023-11-10 LAB — HIV ANTIBODY (ROUTINE TESTING W REFLEX): HIV Screen 4th Generation wRfx: NONREACTIVE

## 2023-11-10 LAB — GLUCOSE, CAPILLARY
Glucose-Capillary: 225 mg/dL — ABNORMAL HIGH (ref 70–99)
Glucose-Capillary: 187 mg/dL — ABNORMAL HIGH (ref 70–99)
Glucose-Capillary: 247 mg/dL — ABNORMAL HIGH (ref 70–99)

## 2023-11-10 LAB — COMPREHENSIVE METABOLIC PANEL WITH GFR
ALT: 10 U/L (ref 0–44)
AST: 14 U/L — ABNORMAL LOW (ref 15–41)
Albumin: 3 g/dL — ABNORMAL LOW (ref 3.5–5.0)
Alkaline Phosphatase: 89 U/L (ref 38–126)
Anion gap: 10 (ref 5–15)
BUN: 23 mg/dL (ref 8–23)
CO2: 24 mmol/L (ref 22–32)
Calcium: 9.3 mg/dL (ref 8.9–10.3)
Chloride: 109 mmol/L (ref 98–111)
Creatinine, Ser: 1.65 mg/dL — ABNORMAL HIGH (ref 0.44–1.00)
GFR, Estimated: 34 mL/min — ABNORMAL LOW (ref >60)
Glucose, Bld: 311 mg/dL — ABNORMAL HIGH (ref 70–99)
Potassium: 4.8 mmol/L (ref 3.5–5.1)
Sodium: 143 mmol/L (ref 135–145)
Total Bilirubin: 0.4 mg/dL (ref 0.0–1.2)
Total Protein: 6.6 g/dL (ref 6.5–8.1)

## 2023-11-10 LAB — CREATININE, SERUM
Creatinine, Ser: 1.68 mg/dL — ABNORMAL HIGH (ref 0.44–1.00)
GFR, Estimated: 34 mL/min — ABNORMAL LOW (ref >60)

## 2023-11-10 LAB — BRAIN NATRIURETIC PEPTIDE: B Natriuretic Peptide: 26.2 pg/mL (ref 0.0–100.0)

## 2023-11-10 LAB — HEMOGLOBIN A1C
Hgb A1c MFr Bld: 6.9 % — ABNORMAL HIGH (ref 4.8–5.6)
Mean Plasma Glucose: 151.33 mg/dL

## 2023-11-10 MED ORDER — HYDRALAZINE HCL 20 MG/ML IJ SOLN: 5.0000 mg | INTRAMUSCULAR | Status: DC | PRN

## 2023-11-10 MED ORDER — SODIUM CHLORIDE 0.9 % IV SOLN: INTRAVENOUS | Status: AC

## 2023-11-10 MED ORDER — TECHNETIUM TO 99M ALBUMIN AGGREGATED
4.4000 | Freq: Once | INTRAVENOUS | Status: AC | PRN
Start: 2023-11-10 — End: 2023-11-10
  Administered 2023-11-10: 4.4 via INTRAVENOUS

## 2023-11-10 MED ORDER — PNEUMOCOCCAL 20-VAL CONJ VACC 0.5 ML IM SUSY
0.5000 mL | PREFILLED_SYRINGE | INTRAMUSCULAR | Status: AC
  Administered 2023-11-11: 0.5 mL via INTRAMUSCULAR
  Filled 2023-11-10: qty 0.5

## 2023-11-10 MED ORDER — SODIUM CHLORIDE 0.9 % IV BOLUS: 500.0000 mL | Freq: Once | INTRAVENOUS | Status: DC

## 2023-11-10 NOTE — Inpatient Diabetes Management (Addendum)
 Inpatient Diabetes Program Recommendations  AACE/ADA: New Consensus Statement on Inpatient Glycemic Control (2015)  Target Ranges:  Prepandial:   less than 140 mg/dL      Peak postprandial:   less than 180 mg/dL (1-2 hours)      Critically ill patients:  140 - 180 mg/dL   Lab Results  Component Value Date   GLUCAP 225 (H) 11/10/2023   HGBA1C 6.9 (H) 11/10/2023    Review of Glycemic Control  Latest Reference Range & Units 11/09/23 14:34 11/09/23 21:34 11/09/23 23:54 11/10/23 06:20 11/10/23 07:45 11/10/23 11:53  Glucose-Capillary 70 - 99 mg/dL 871 (H) 769 (H) 729 (H) 239 (H) 214 (H) 225 (H)   Diabetes history: DM 1- NEEDS basal insulin  and Correction/meal coverage Outpatient Diabetes medications:  Farxiga  10 mg daily Trulicity  3 mg weekly (on Friday's) Tresiba  8 units daily Humalog  0-30 units tid with meals (sliding scale) Dexcom G7 sensor Current orders for Inpatient glycemic control:  Novolog  0-9 units tid with meals Semglee  8 units daily  Inpatient Diabetes Program Recommendations:    Note patient did receive Solumedrol 40 mg x1 on 6/26.  Home insulin  dose resumed.  Anticipate that CBG's will improve. If postprandial blood sugars increased, may need low dose meal coverage added.   Thanks,  Randall Bullocks, RN, BC-ADM Inpatient Diabetes Coordinator Pager [phone]  (8a-5p)

## 2023-11-10 NOTE — ED Notes (Signed)
 This RN set patients breakfast tray up and informed patient what she was getting for breakfast.

## 2023-11-10 NOTE — Plan of Care (Signed)

## 2023-11-10 NOTE — Progress Notes (Signed)
 PROGRESS NOTE    Amy [name]  FMW:[id number] DOB: 1960-07-05 DOA: 11/09/2023 PCP: [facility]   Brief Narrative:  This 63 yrs old female with PMH significant of chronic diastolic CHF, hypertension, diabetes mellitus type 2, Legal blindness, sleep apnea, chronic kidney disease stage III presents to the ED with complaints of shortness of breath.  Patient states she has been getting short of breath with minimal exertion over the last 1 month with nonproductive cough.  Denies any chest pain, fever or chills. IN ED troponin and BNP were unremarkable.  Chest x-ray did not show any acute infiltrates. Patient appeared visibly tachypneic.  D-dimer was minimally elevated at 0.9 but given the history of worsening renal functions and patient's allergies to contrast,  radiology recommended VQ scan which is pending.  Creatinine is around 1.8 increased from 1.2 last month.  Patient admitted for further evaluation.   Assessment & Plan:   Principal Problem:   SOB (shortness of breath) Active Problems:   Blindness   DM type 1, not at goal, causing eye disease (HCC)   Hypothyroidism   Diabetic neuropathy (HCC)   Hyperlipidemia LDL goal <70   Essential hypertension   Chronic diastolic heart failure (HCC)   ARF (acute renal failure) (HCC)   Shortness of breath,  cause not clear: Patient appeared tachypneic on arrival, not hypoxic at this time.   She has non productive cough.  D-dimer is minimally elevated. CT chest w/o contrast showed no acute chest findings. VQ scan ordered given AKI.    2D echo 7/24 showed EF of 60 to 65% with grade 1 diastolic dysfunction.   At this time appears dehydrated.  Diabetes mellitus type 2 : She takes insulin  glargine Trulicity  and Farxiga  at home.   Last hemoglobin A1c was 6.9 today.   Continue insulin  glargine and sliding scale coverage.  Hypertension: Holding ARB because of worsening renal function.   Continue As needed IV hydralazine .   Restart ARB after creatinine improves.  Acute on chronic kidney disease stage III: Creatinine mildly elevated from last month.   Continue IV fluid resuscitation, avoid nephrotoxic medications.   Repeat metabolic panel.  Holding ARB for now.  Diabetic neuropathy : Continue gabapentin  , renally adjusted dose.  Hyperlipidemia : Continue  statins.  GERD : Continue  PPI.  Blindness:  Continue supportive care  Sleep apnea on CPAP at bedtime.  Gout : Continue  allopurinol .  Hypothyroidism : Continue Synthroid .  DVT prophylaxis: Lovenox  Code Status: Full code Family Communication: No family at bed side. Disposition Plan:   Status is: Observation The patient remains OBS appropriate and will d/c before 2 midnights.   Admitted for tachypnea with slightly elevated D-dimer.  VQ scan is pending to rule out PE.  Consultants:  None  Procedures:VQ Scan  Antimicrobials:  Anti-infectives (From admission, onward)    None      Subjective: Patient was seen and examined at bedside.Overnight events noted. Patient reports feeling improved. She remains very comfortable,  heart rate and respiratory rate is stable. She denies any chest pain.  VQ scan is pending  Objective: Vitals:   11/10/23 0730 11/10/23 0924 11/10/23 1025 11/10/23 1030  BP: 97/63  119/71   Pulse: 62  68   Resp: 13  (!) 23   Temp:  98.1 F (36.7 C)  98.1 F (36.7 C)  TempSrc:    Oral  SpO2: 100%  100%   Weight:      Height:  No intake or output data in the 24 hours ending 11/10/23 1052 Filed Weights   11/09/23 1317  Weight: 81.6 kg    Examination:  General exam: Appears calm and comfortable, deconditioned, legally blind. Respiratory system: Clear to auscultation. Respiratory effort normal.  RR 16 Cardiovascular system: S1 & S2 heard, RRR. No JVD, murmurs, rubs, gallops or clicks.  Gastrointestinal system: Abdomen is nondistended, soft and nontender.Normal bowel sounds heard. Central nervous  system: Alert and oriented x 3. No focal neurological deficits. Extremities: No edema, no cyanosis, no clubbing. Skin: No rashes, lesions or ulcers Psychiatry: Judgement and insight appear normal. Mood & affect appropriate.    Data Reviewed: I have personally reviewed following labs and imaging studies  CBC: Recent Labs  Lab 11/09/23 1319 11/10/23 0105  WBC 11.1* 8.9  HGB 13.2 13.4  HCT 41.1 42.2  MCV 80.9 82.7  PLT 253 235   Basic Metabolic Panel: Recent Labs  Lab 11/09/23 1319 11/10/23 0105 11/10/23 0106  NA 144  --  143  K 4.2  --  4.8  CL 110  --  109  CO2 25  --  24  GLUCOSE 136*  --  311*  BUN 25*  --  23  CREATININE 1.84* 1.68* 1.65*  CALCIUM  9.5  --  9.3   GFR: Estimated Creatinine Clearance: 31.9 mL/min (A) (by C-G formula based on SCr of 1.65 mg/dL (H)). Liver Function Tests: Recent Labs  Lab 11/09/23 1319 11/10/23 0106  AST 17 14*  ALT 13 10  ALKPHOS 91 89  BILITOT 0.2 0.4  PROT 6.9 6.6  ALBUMIN 3.3* 3.0*   No results for input(s): LIPASE, AMYLASE in the last 168 hours. No results for input(s): AMMONIA in the last 168 hours. Coagulation Profile: No results for input(s): INR, PROTIME in the last 168 hours. Cardiac Enzymes: No results for input(s): CKTOTAL, CKMB, CKMBINDEX, TROPONINI in the last 168 hours. BNP (last 3 results) No results for input(s): PROBNP in the last 8760 hours. HbA1C: Recent Labs    11/10/23 0106  HGBA1C 6.9*   CBG: Recent Labs  Lab 11/09/23 1434 11/09/23 2134 11/09/23 2354 11/10/23 0620 11/10/23 0745  GLUCAP 128* 230* 270* 239* 214*   Lipid Profile: No results for input(s): CHOL, HDL, LDLCALC, TRIG, CHOLHDL, LDLDIRECT in the last 72 hours. Thyroid  Function Tests: No results for input(s): TSH, T4TOTAL, FREET4, T3FREE, THYROIDAB in the last 72 hours. Anemia Panel: No results for input(s): VITAMINB12, FOLATE, FERRITIN, TIBC, IRON, RETICCTPCT in the last 72  hours. Sepsis Labs: No results for input(s): PROCALCITON, LATICACIDVEN in the last 168 hours.  No results found for this or any previous visit (from the past 240 hours).    Radiology Studies: CT CHEST WO CONTRAST Result Date: 11/10/2023 CLINICAL DATA:  Dyspnea, chronic, unclear etiology. Shortness of breath. EXAM: CT CHEST WITHOUT CONTRAST TECHNIQUE: Multidetector CT imaging of the chest was performed following the standard protocol without IV contrast. RADIATION DOSE REDUCTION: This exam was performed according to the departmental dose-optimization program which includes automated exposure control, adjustment of the mA and/or kV according to patient size and/or use of iterative reconstruction technique. COMPARISON:  AP and lateral chest yesterday, PA and lateral chest 04/23/2020, CT abdomen and pelvis no contrast 11/23/2019, and chest CT without contrast 02/13/2017. FINDINGS: Cardiovascular: There are three-vessel coronary calcifications heaviest in the LAD and circumflex. The cardiac size is normal. There is no pericardial effusion. The pulmonary veins are nondistended. The pulmonary trunk is prominent measuring 3 cm, which may indicate mild  pulmonary arterial hypertension, previously 2.8 cm. There is scattered aortic calcification without aneurysm. Normal variant 4 vessel aortic arch with arch origin of the left vertebral artery. Mediastinum/Nodes: No enlarged mediastinal or axillary lymph nodes. Thyroid  gland, trachea, and esophagus demonstrate no significant findings. There is a tiny hiatal hernia, unchanged. Lungs/Pleura: Central airways are clear. No bronchial plugging, thickening or dilatation. There is eventration and mild elevation the right hemidiaphragm, seen previously. There is a 3 mm chronic stable left lower lobe nodule on 4:84 consistent with a benign nodule. There are occasional linear scar-like opacities in the lung bases. No infiltrates or further nodules are seen. There is no  evidence of subpleural fibrosis or emphysema. The lungs are otherwise clear. No pleural effusion or pneumothorax. Upper Abdomen: No acute abnormality. Musculoskeletal: Multilevel thoracic spine bridging enthesopathy consistent with DISH. No acute or other significant osseous findings. There is a calcified fibroadenoma in the outer right breast. No chest wall mass is seen. IMPRESSION: 1. No acute chest CT findings. 2. 3 mm chronic stable left lower lobe nodule consistent with a benign nodule. 3. Aortic and coronary artery atherosclerosis. 4. Prominent pulmonary trunk measuring 3 cm, which may indicate mild pulmonary arterial hypertension. 5. Tiny hiatal hernia. 6. Calcified fibroadenoma in the outer right breast. Aortic Atherosclerosis (ICD10-I70.0). Electronically Signed   By: Francis Quam M.D.   On: 11/10/2023 02:03   DG Chest 2 View Result Date: 11/09/2023 CLINICAL DATA:  sob, dizziness EXAM: CHEST - 2 VIEW COMPARISON:  April 23, 2020 FINDINGS: No focal airspace consolidation, pleural effusion, or pneumothorax. No cardiomegaly.No acute fracture or destructive lesion. IMPRESSION: No acute cardiopulmonary abnormality. Electronically Signed   By: Rogelia Myers M.D.   On: 11/09/2023 14:55   Scheduled Meds:  allopurinol   300 mg Oral Daily   aspirin  EC  81 mg Oral Daily   docusate sodium   100 mg Oral BID   enoxaparin  (LOVENOX ) injection  30 mg Subcutaneous Daily   gabapentin   400 mg Oral BID   insulin  aspart  0-9 Units Subcutaneous TID WC   insulin  glargine-yfgn  8 Units Subcutaneous Daily   levothyroxine   125 mcg Oral Q0600   pantoprazole   40 mg Oral Daily   simvastatin   20 mg Oral QPM   Continuous Infusions:  sodium chloride        LOS: 0 days    Time spent: 50 mins    Darcel Dawley, MD [facility]   If 7PM-7AM, please contact night-coverage

## 2023-11-10 NOTE — Care Management Obs Status (Signed)
 MEDICARE OBSERVATION STATUS NOTIFICATION   Patient Details  Name: Amy [name] MRN: [id number] Date of Birth: [birth year]   Medicare Observation Status Notification Given:  Yes    [name], RN 11/10/2023, 9:39 AM

## 2023-11-10 NOTE — Care Management (Signed)
 Transition of Care [facility]) - Inpatient Brief Assessment   Patient Details  Name: Amy [name] MRN: [id number] Date of Birth: 1961-04-24  Transition of [facility]) CM/SW Contact:    [name], RN Phone Number: 11/10/2023, 9:48 AM   Clinical Narrative: 63 yo patient lives alone, blind on disability has [facility] medicare dual. Has some transportation needs, sister sometimes helps her. Is with [facility] for primary care. Has a walker that is raggety has received it within 5 years.   TOC will follow for needs   Transition of Care Asessment: Insurance and Status: Insurance coverage has been reviewed Patient has primary care physician: Yes Home environment has been reviewed: Sister assists her at times Prior level of function:: independent with walker   Social Drivers of Health Review:  (not on file) Readmission risk has been reviewed: Yes Transition of care needs: transition of care needs identified, TOC will continue to follow

## 2023-11-10 NOTE — ED Notes (Addendum)
 Pt got dexcom notification on cell phone that she had high glucose 335, finger stick CBG 239 RN notified

## 2023-11-11 DIAGNOSIS — R0602 Shortness of breath: Secondary | ICD-10-CM | POA: Diagnosis not present

## 2023-11-11 LAB — GLUCOSE, CAPILLARY
Glucose-Capillary: 232 mg/dL — ABNORMAL HIGH (ref 70–99)
Glucose-Capillary: 212 mg/dL — ABNORMAL HIGH (ref 70–99)

## 2023-11-11 NOTE — Discharge Summary (Signed)
 Physician Discharge Summary  Amy [name] FMW:[id number] DOB: 1960-11-01 DOA: 11/09/2023  PCP: [facility]  Admit date: 11/09/2023  Discharge date: 11/11/2023  Admitted From: Home.  Disposition:  Home.  Recommendations for Outpatient Follow-up:  Follow up with PCP in 1-2 weeks. Please obtain BMP/CBC in one week. Advised to continue current medications.   Pulmonary embolism has been ruled out.  Home Health: None Equipment/Devices:None  Discharge Condition: Stable CODE STATUS:Full code Diet recommendation: Heart Healthy   Brief [place] [facility] Course: This 63 yrs old female with PMH significant of chronic diastolic CHF, hypertension, diabetes mellitus type 2, Legal blindness, sleep apnea, chronic kidney disease stage IIIa presents to the ED with complaints of shortness of breath.  Patient states she has been getting short of breath with minimal exertion over the last 1 month with nonproductive cough.  Denies any chest pain, fever or chills. IN ED troponin and BNP were unremarkable. Chest x-ray did not show any acute infiltrates. Patient appeared visibly tachypneic.  D-dimer was minimally elevated at 0.9 but given the history of worsening renal functions and patient's allergies to contrast,  radiology recommended VQ scan which is pending.  Creatinine is around 1.8 increased from 1.2 last month.  Patient admitted for further evaluation.  VQ scan negative for pulmonary embolism. Patient has significant improvement,  felt much better.  Patient wants to be discharged home.  Discharge Diagnoses:  Principal Problem:   SOB (shortness of breath) Active Problems:   Blindness   DM type 1, not at goal, causing eye disease (HCC)   Hypothyroidism   Diabetic neuropathy (HCC)   Hyperlipidemia LDL goal <70   Essential hypertension   Chronic diastolic heart failure (HCC)   ARF (acute renal failure) (HCC)  Shortness of breath,  cause not clear: Patient appeared tachypneic  on arrival, not hypoxic at this time.   She has non productive cough.  D-dimer is minimally elevated. CT chest w/o contrast showed no acute chest findings. VQ scan ordered given AKI.    2D echo 7/24 showed EF of 60 to 65% with grade 1 diastolic dysfunction.   At this time appears dehydrated.  VQ scan negative.   Shortness of breath has improved.   Diabetes mellitus type 2 : She takes insulin  glargine Trulicity  and Farxiga  at home.   Last hemoglobin A1c was 6.9 today.   Continue insulin  glargine and sliding scale coverage.   Hypertension: Holding ARB because of worsening renal function.   Continue As needed IV hydralazine .   Restart ARB after creatinine improves.   Acute on chronic kidney disease stage III: Creatinine mildly elevated from last month.   Continue IV fluid resuscitation, avoid nephrotoxic medications.   Repeat metabolic panel.  Holding ARB for now.   Diabetic neuropathy : Continue gabapentin  , renally adjusted dose.   Hyperlipidemia : Continue  statins.   GERD : Continue  PPI.   Blindness:  Continue supportive care   Sleep apnea on CPAP at bedtime.   Gout : Continue  allopurinol .   Hypothyroidism : Continue Synthroid .  Discharge Instructions  Discharge Instructions     Call MD for:  difficulty breathing, headache or visual disturbances   Complete by: As directed    Call MD for:  persistant dizziness or light-headedness   Complete by: As directed    Call MD for:  persistant nausea and vomiting   Complete by: As directed    Diet - low sodium heart healthy   Complete by: As directed  Diet Carb Modified   Complete by: As directed    Discharge instructions   Complete by: As directed    Advised to follow-up with primary care physician in 1 week. Advised to continue current medications.   Pulmonary embolism has been ruled out.   Increase activity slowly   Complete by: As directed       Allergies as of 11/11/2023       Reactions   Penicillins  Hives, Swelling   Has patient had a PCN reaction causing immediate rash, facial/tongue/throat swelling, SOB or lightheadedness with hypotension: yes- face swelling Has patient had a PCN reaction causing severe rash involving mucus membranes or skin necrosis: no Has patient had a PCN reaction that required hospitalization unknown (childhood allergy) Has patient had a PCN reaction occurring within the last 10 years: no If all of the above answers are NO, then may proceed with Cephalosporin use.   Tramadol  Nausea And Vomiting   Intense nausea   Vicodin [hydrocodone -acetaminophen ] Nausea And Vomiting   Codeine Nausea Only   Other reaction(s): Unknown   Iodine-131 Hives   Iohexol Hives   Pt developed itching and hives along with nasal congestion; needs 13 hour premeds for future studies, Onset Date: [id number]   Lisinopril Cough   Other reaction(s): Unknown   Sulfa Antibiotics Nausea And Vomiting        Medication List     STOP taking these medications    hydrochlorothiazide 12.5 MG capsule Commonly known as: MICROZIDE       TAKE these medications    albuterol  108 (90 Base) MCG/ACT inhaler Commonly known as: VENTOLIN  HFA Inhale 2 puffs into the lungs every 4 (four) hours as needed for wheezing or shortness of breath. For short   allopurinol  300 MG tablet Commonly known as: ZYLOPRIM  Take 300 mg by mouth 2 (two) times daily.   aspirin  EC 81 MG tablet Take 81 mg by mouth daily.   blood glucose meter kit and supplies Dispense based on patient and insurance preference. Use up to four times daily as directed. (FOR ICD-10 E10.9, E11.9).   celecoxib 200 MG capsule Commonly known as: CELEBREX Take 200 mg by mouth 2 (two) times daily.   cetirizine 10 MG tablet Commonly known as: ZYRTEC Take 10 mg by mouth at bedtime.   dapagliflozin  propanediol 10 MG Tabs tablet Commonly known as: Farxiga  Take 1 tablet (10 mg total) by mouth daily.   Dexcom G7 Sensor Misc 1 Device by  Does not apply route as directed.   docusate sodium  100 MG capsule Commonly known as: COLACE Take 100 mg by mouth 2 (two) times daily.   Embrace Talk Glucose Test test strip Generic drug: glucose blood USE TO CHECK BLOOD SUGARS FOUR TIMES A DAY BEFORE MEALS AND AT BEDTIME DX E11.29   OneTouch Verio test strip Generic drug: glucose blood Check blood sugar 1-2 times daily   esomeprazole  40 MG capsule Commonly known as: NEXIUM  Take 1 capsule (40 mg total) by mouth daily.   fluticasone  50 MCG/ACT nasal spray Commonly known as: FLONASE  Place 2 sprays into both nostrils daily.   gabapentin  400 MG capsule Commonly known as: NEURONTIN  Take 400 mg by mouth 2 (two) times daily.   hydrOXYzine  25 MG tablet Commonly known as: ATARAX  Take 0.5 tablets (12.5 mg total) by mouth every 8 (eight) hours as needed for itching.   insulin  lispro 100 UNIT/ML KwikPen Commonly known as: HUMALOG  MAX DAILY 30 UNITS What changed:  how much to take how  to take this when to take this additional instructions   Insulin  Pen Needle 32G X 4 MM Misc 1 Device by Does not apply route in the morning, at noon, in the evening, and at bedtime.   levothyroxine  150 MCG tablet Commonly known as: SYNTHROID  Take 1 tablet (150 mcg total) by mouth daily. Needs appt for future refills   MiraLax  17 GM/SCOOP powder Generic drug: polyethylene glycol powder Take 17 g by mouth daily as needed for moderate constipation.   naproxen  500 MG tablet Commonly known as: NAPROSYN  Take 500 mg by mouth 2 (two) times daily with a meal.   olmesartan 40 MG tablet Commonly known as: BENICAR Take 40 mg by mouth daily.   Prodigy Autocode Blood Glucose w/Device Kit 1 Device by Does not apply route daily in the afternoon.   Prodigy Twist Top Lancets 28G Misc USE TO CHECK BLOOD SUGARS   simvastatin  20 MG tablet Commonly known as: ZOCOR  Take 1 tablet (20 mg total) by mouth every evening.   Tresiba  FlexTouch 200 UNIT/ML  FlexTouch Pen Generic drug: insulin  degludec Inject 8 Units into the skin daily at 6 (six) AM. What changed: when to take this   Trulicity  3 MG/0.5ML Soaj Generic drug: Dulaglutide  Inject 3 mg as directed once a week. What changed: additional instructions        Follow-up Information     [facility] Follow up in 1 week(s).   Contact information: [address] [place] [address] [phone]                Allergies  Allergen Reactions   Penicillins Hives and Swelling    Has patient had a PCN reaction causing immediate rash, facial/tongue/throat swelling, SOB or lightheadedness with hypotension: yes- face swelling Has patient had a PCN reaction causing severe rash involving mucus membranes or skin necrosis: no Has patient had a PCN reaction that required hospitalization unknown (childhood allergy) Has patient had a PCN reaction occurring within the last 10 years: no If all of the above answers are NO, then may proceed with Cephalosporin use.    Tramadol  Nausea And Vomiting    Intense nausea   Vicodin [Hydrocodone -Acetaminophen ] Nausea And Vomiting   Codeine Nausea Only    Other reaction(s): Unknown   Iodine-131 Hives   Iohexol Hives    Pt developed itching and hives along with nasal congestion; needs 13 hour premeds for future studies, Onset Date: [id number]    Lisinopril Cough    Other reaction(s): Unknown   Sulfa Antibiotics Nausea And Vomiting    Consultations: None   Procedures/Studies: NM Pulmonary Perfusion Result Date: 11/10/2023 CLINICAL DATA:  MC-NMpulm embolism short of breath EXAM: NUCLEAR MEDICINE PERFUSION LUNG SCAN TECHNIQUE: Perfusion images were obtained in multiple projections after intravenous injection of radiopharmaceutical. RADIOPHARMACEUTICALS:  4.4 mCi Tc-23m MAA COMPARISON:  Chest radiograph same day FINDINGS: No wedge-shaped peripheral perfusion defect within LEFT or RIGHT lung to suggest acute pulmonary  embolism. Normal perfusion pattern. IMPRESSION: No evidence acute pulmonary embolism. Electronically Signed   By: Jackquline Boxer M.D.   On: 11/10/2023 14:48   CT CHEST WO CONTRAST Result Date: 11/10/2023 CLINICAL DATA:  Dyspnea, chronic, unclear etiology. Shortness of breath. EXAM: CT CHEST WITHOUT CONTRAST TECHNIQUE: Multidetector CT imaging of the chest was performed following the standard protocol without IV contrast. RADIATION DOSE REDUCTION: This exam was performed according to the departmental dose-optimization program which includes automated exposure control, adjustment of the mA and/or kV according to patient size and/or use  of iterative reconstruction technique. COMPARISON:  AP and lateral chest yesterday, PA and lateral chest 04/23/2020, CT abdomen and pelvis no contrast 11/23/2019, and chest CT without contrast 02/13/2017. FINDINGS: Cardiovascular: There are three-vessel coronary calcifications heaviest in the LAD and circumflex. The cardiac size is normal. There is no pericardial effusion. The pulmonary veins are nondistended. The pulmonary trunk is prominent measuring 3 cm, which may indicate mild pulmonary arterial hypertension, previously 2.8 cm. There is scattered aortic calcification without aneurysm. Normal variant 4 vessel aortic arch with arch origin of the left vertebral artery. Mediastinum/Nodes: No enlarged mediastinal or axillary lymph nodes. Thyroid  gland, trachea, and esophagus demonstrate no significant findings. There is a tiny hiatal hernia, unchanged. Lungs/Pleura: Central airways are clear. No bronchial plugging, thickening or dilatation. There is eventration and mild elevation the right hemidiaphragm, seen previously. There is a 3 mm chronic stable left lower lobe nodule on 4:84 consistent with a benign nodule. There are occasional linear scar-like opacities in the lung bases. No infiltrates or further nodules are seen. There is no evidence of subpleural fibrosis or emphysema.  The lungs are otherwise clear. No pleural effusion or pneumothorax. Upper Abdomen: No acute abnormality. Musculoskeletal: Multilevel thoracic spine bridging enthesopathy consistent with DISH. No acute or other significant osseous findings. There is a calcified fibroadenoma in the outer right breast. No chest wall mass is seen. IMPRESSION: 1. No acute chest CT findings. 2. 3 mm chronic stable left lower lobe nodule consistent with a benign nodule. 3. Aortic and coronary artery atherosclerosis. 4. Prominent pulmonary trunk measuring 3 cm, which may indicate mild pulmonary arterial hypertension. 5. Tiny hiatal hernia. 6. Calcified fibroadenoma in the outer right breast. Aortic Atherosclerosis (ICD10-I70.0). Electronically Signed   By: Francis Quam M.D.   On: 11/10/2023 02:03   DG Chest 2 View Result Date: 11/09/2023 CLINICAL DATA:  sob, dizziness EXAM: CHEST - 2 VIEW COMPARISON:  April 23, 2020 FINDINGS: No focal airspace consolidation, pleural effusion, or pneumothorax. No cardiomegaly.No acute fracture or destructive lesion. IMPRESSION: No acute cardiopulmonary abnormality. Electronically Signed   By: Rogelia Myers M.D.   On: 11/09/2023 14:55    Subjective: Patient was seen and examined at bedside.  Overnight events noted. Patient reports feeling much improved. She wants to be discharged home.  Discharge Exam: Vitals:   11/11/23 0410 11/11/23 0740  BP: 130/65 135/68  Pulse: 64 (!) 59  Resp: 17 16  Temp: (!) 97.5 F (36.4 C) 98.1 F (36.7 C)  SpO2: 100% 100%   Vitals:   11/10/23 1935 11/11/23 0017 11/11/23 0410 11/11/23 0740  BP: 128/83 (!) 151/72 130/65 135/68  Pulse: 60 61 64 (!) 59  Resp: 17 17 17 16   Temp: (!) 97.5 F (36.4 C) 97.6 F (36.4 C) (!) 97.5 F (36.4 C) 98.1 F (36.7 C)  TempSrc: Oral Axillary Oral Oral  SpO2: 98% 98% 100% 100%  Weight:      Height:        General: Pt is alert, awake, not in acute distress Cardiovascular: RRR, S1/S2 +, no rubs, no  gallops Respiratory: CTA bilaterally, no wheezing, no rhonchi Abdominal: Soft, NT, ND, bowel sounds + Extremities: no edema, no cyanosis    The results of significant diagnostics from this hospitalization (including imaging, microbiology, ancillary and laboratory) are listed below for reference.     Microbiology: No results found for this or any previous visit (from the past 240 hours).   Labs: BNP (last 3 results) Recent Labs    11/10/23 0106  BNP 26.2   Basic Metabolic Panel: Recent Labs  Lab 11/09/23 1319 11/10/23 0105 11/10/23 0106  NA 144  --  143  K 4.2  --  4.8  CL 110  --  109  CO2 25  --  24  GLUCOSE 136*  --  311*  BUN 25*  --  23  CREATININE 1.84* 1.68* 1.65*  CALCIUM  9.5  --  9.3   Liver Function Tests: Recent Labs  Lab 11/09/23 1319 11/10/23 0106  AST 17 14*  ALT 13 10  ALKPHOS 91 89  BILITOT 0.2 0.4  PROT 6.9 6.6  ALBUMIN 3.3* 3.0*   No results for input(s): LIPASE, AMYLASE in the last 168 hours. No results for input(s): AMMONIA in the last 168 hours. CBC: Recent Labs  Lab 11/09/23 1319 11/10/23 0105 11/10/23 1147  WBC 11.1* 8.9 9.7  HGB 13.2 13.4 12.2  HCT 41.1 42.2 38.1  MCV 80.9 82.7 81.1  PLT 253 235 218   Cardiac Enzymes: No results for input(s): CKTOTAL, CKMB, CKMBINDEX, TROPONINI in the last 168 hours. BNP: Invalid input(s): POCBNP CBG: Recent Labs  Lab 11/10/23 1153 11/10/23 1618 11/10/23 2015 11/11/23 0830 11/11/23 1222  GLUCAP 225* 187* 247* 232* 212*   D-Dimer Recent Labs    11/09/23 1319  DDIMER 0.90*   Hgb A1c Recent Labs    11/10/23 0106  HGBA1C 6.9*   Lipid Profile No results for input(s): CHOL, HDL, LDLCALC, TRIG, CHOLHDL, LDLDIRECT in the last 72 hours. Thyroid  function studies No results for input(s): TSH, T4TOTAL, T3FREE, THYROIDAB in the last 72 hours.  Invalid input(s): FREET3 Anemia work up No results for input(s): VITAMINB12, FOLATE, FERRITIN,  TIBC, IRON, RETICCTPCT in the last 72 hours. Urinalysis    Component Value Date/Time   COLORURINE YELLOW 11/09/2023 2349   APPEARANCEUR HAZY (A) 11/09/2023 2349   LABSPEC 1.012 11/09/2023 2349   PHURINE 5.0 11/09/2023 2349   GLUCOSEU >=500 (A) 11/09/2023 2349   HGBUR NEGATIVE 11/09/2023 2349   BILIRUBINUR NEGATIVE 11/09/2023 2349   KETONESUR NEGATIVE 11/09/2023 2349   PROTEINUR 100 (A) 11/09/2023 2349   UROBILINOGEN 0.2 11/06/2021 1202   NITRITE NEGATIVE 11/09/2023 2349   LEUKOCYTESUR NEGATIVE 11/09/2023 2349   Sepsis Labs Recent Labs  Lab 11/09/23 1319 11/10/23 0105 11/10/23 1147  WBC 11.1* 8.9 9.7   Microbiology No results found for this or any previous visit (from the past 240 hours).   Time coordinating discharge: Over 30 minutes  SIGNED:   Darcel Dawley, MD  [facility] 11/11/2023, 1:33 PM Pager   If 7PM-7AM, please contact night-coverage

## 2023-11-11 NOTE — Discharge Instructions (Signed)
 Advised to follow-up with primary care physician in 1 week. Advised to continue current medications.   Pulmonary embolism has been ruled out.

## 2023-11-11 NOTE — TOC Transition Note (Signed)
 Transition of Care [facility]) - Discharge Note   Patient Details  Name: Amy [name] MRN: [id number] Date of Birth: May 28, 1960  Transition of [facility]) CM/SW Contact:  Robynn Eileen Hoose, RN Phone Number: 11/11/2023, 1:38 PM   Clinical Narrative:   Patient is being discharged today. Spoke with patient, her sister will transport her home.    Final next level of care: Home/Self Care Barriers to Discharge: No Barriers Identified   Patient Goals and CMS Choice            Discharge Placement                       Discharge Plan and Services Additional resources added to the After Visit Summary for                                       Social Drivers of Health (SDOH) Interventions SDOH Screenings   Food Insecurity: No Food Insecurity (11/10/2023)  Housing: Low Risk  (11/10/2023)  Transportation Needs: No Transportation Needs (11/10/2023)  Utilities: Not At Risk (11/10/2023)  Alcohol Screen: Low Risk  (05/29/2019)  Depression (PHQ2-9): Low Risk  (08/05/2020)  Financial Resource Strain: Medium Risk (10/02/2019)  Social Connections: Unknown (03/09/2023)   Received from [facility]  Tobacco Use: Medium Risk (11/09/2023)     Readmission Risk Interventions     No data to display

## 2023-11-11 NOTE — Plan of Care (Signed)

## 2023-11-13 ENCOUNTER — Ambulatory Visit: Payer: 59 | Admitting: Family Medicine

## 2023-12-07 ENCOUNTER — Telehealth: Payer: Self-pay | Admitting: Neurology

## 2023-12-07 NOTE — Telephone Encounter (Signed)
 Appointment details confirmed

## 2023-12-13 ENCOUNTER — Ambulatory Visit (INDEPENDENT_AMBULATORY_CARE_PROVIDER_SITE_OTHER): Admitting: Neurology

## 2023-12-13 VITALS — BP 130/68 | HR 60 | Ht 59.0 in | Wt 170.6 lb

## 2023-12-13 DIAGNOSIS — G4733 Obstructive sleep apnea (adult) (pediatric): Secondary | ICD-10-CM | POA: Diagnosis not present

## 2023-12-13 NOTE — Progress Notes (Signed)
 Subjective:    [name] ID: Amy [name] is a 63 y.o. female.  HPI    Interim history:   Amy [name] is a 63 year old right-handed woman with an underlying medical history of diabetes, blindness (with L prosthetic eye and R eye blindness d/t diabetes), hyperlipidemia, asthma, arthritis, spinal stenosis, thyroid  disease, nephropathy, orthostatic hypotension deemed secondary to diabetic autonomic neuropathy (seen by Dr. Margaret in 9/17), and obesity, who presents for follow up consultation of Amy [name] sleep apnea, on PAP therapy.  Amy [name] is unaccompanied today (Amy [name] son brought Amy [name]) and presents after over 1 and half years.    Today, 12/13/2023: I reviewed Amy [name] CPAP compliance data from 11/12/2023 through 12/11/2023, which is a total of 30 days, during which time Amy [name] used Amy [name] machine every night with percent use days greater than 4 hours at 97%, indicating excellent compliance with an average usage of 8 hours and 29 minutes, residual AHI mildly elevated at 7.1/h, leak on Amy low side with a 95th percentile at 4.1 L/min on a pressure of 10 cm with EPR of 3.  Amy [name] reports doing well with Amy [name] CPAP, Amy [name] tolerates Amy pressure, does not have significant mouth dryness, would be willing to try increasing Amy pressure.    Amy [name]'s allergies, current medications, family history, past medical history, past social history, past surgical history and problem list were reviewed and updated as appropriate.    Previously:   04/20/2022: I reviewed Amy [name] CPAP compliance data from 03/20/2022 through 04/18/2022, which is a total of 30 days, during which time Amy [name] used Amy [name] machine 29 days with percent use days greater than 4 hours at 80%, indicating very good compliance with an average usage of 5 hours and 46 minutes, residual AHI borderline elevated at 6.7/h, leak on Amy low side with Amy 95th percentile at 4.6 L/min on a pressure of 10 cm with EPR of 3.  Amy [name] reports doing fairly well with Amy [name] new machine.  Amy [name] likes Amy [name]  new machine but Amy water chamber is smaller and sometimes in Amy middle of Amy night Amy [name] runs out of water.  Amy [name] does have some mouth dryness, uses a fullface mask and is noticing improvement in Amy [name] daytime somnolence.  Amy [name] does not sleep all that well and has sleep disruption.  Amy [name] is motivated to continue with treatment at this time.   Amy [name] saw Greig Forbes, NP on 11/09/2021 at which time Amy [name] reported having difficulty getting supplies.  Amy [name] was due for a new machine.  A sleep study was ordered.    Amy [name] saw Greig Forbes, NP in follow-up on 10/09/2019, at which time Amy [name] reported difficulty tolerating Amy [name] CPAP.  Amy [name] was compliant with treatment but felt that Amy pressure was too high.  Amy [name] had congestion with allergy symptoms at Amy time.   Amy [name] saw Elveria Lunger, NP on 09/06/2017, at which time Amy [name] was compliant with Amy [name] CPAP.   Amy [name] saw Elveria Lunger, NP on 02/09/2017, at which time Amy [name] was very compliant with Amy [name] CPAP.  Greig Forbes, NP in subsequent appointments   I saw Amy [name] on 12/29/2016 at which time Amy [name] was mildly suboptimal with Amy [name] CPAP.   Amy [name] had a split-night sleep study on 12/21/2021 which showed a baseline AHI of 79.1/h, O2 nadir 50%.  Amy [name] had good response to CPAP therapy of 10 cm.  Amy [name] AHI was reduced to 1.9/h with O2 nadir of 93% but non-REM sleep achieved on this pressure. Based on Amy [name] test results I prescribed a new  CPAP machine.  Amy [name] set up date was 03/04/2022. Amy [name] has a ResMed air sense 11 AutoSet machine.   I first met Amy [name] on 09/22/16, at Amy request of Amy [name] PCP, at which time Amy [name] reported snoring and excessive daytime somnolence. I invited Amy [name] for a sleep study. Amy [name] had a baseline sleep study, followed by a CPAP titration study. I went over Amy [name] test results with Amy [name] in detail today. Baseline sleep study from 10/02/2016 showed a sleep efficiency of 84.8%, sleep latency of 18.5 minutes, REM latency of 72.5 minutes, Amy [name] had absence of slow-wave sleep, an increased percentage of stage II sleep,  REM sleep at 16.8%. Total AHI was 6.8 per hour, REM AHI 28 per hour, supine sleep was nearly absent. Average oxygen  saturation was 95%, nadir was 78%, time below 89% saturation of 24 minutes. Based on Amy [name] sleep-related complaints and significant desaturations noted during REM sleep I suggested Amy [name] return for a full night CPAP titration study. Amy [name] had this on 10/18/2016, sleep efficiency was 85.4%, sleep latency of 34 minutes, REM latency was 194 minutes. Amy [name] had slow-wave sleep at 7.8%, REM sleep at 12.1%. Amy [name] was fitted with medium nasal pillows and CPAP was titrated from 5 cm to 9 cm. On Amy final pressure Amy [name] AHI was 0 per hour, nonsupine REM sleep was achieved and alternated was 86%. I suggested a home CPAP pressure of 10 cm.   I reviewed Amy [name] CPAP compliance data from 11/28/16 to 12/27/16, which is a total of 30 days, during which time Amy [name] used Amy [name] CPAP 26 days, with percent used days greater than 4 hours of 63%, indicating suboptimal compliance, average usage of 5 hours and 27 minutes, average AHI of 3/hour, leak low with Amy 95th percentile of 2.8 lpm on a pressure of 10 cm with EPR of 3. Amy [name] reports feeling better, not as sleepy during Amy day. Generally speaking, Amy [name] is tolerating Amy mask and Amy pressure but sometimes Amy head your slides off. Amy [name] had a couple of nights where Amy [name] was traveling, another time, there was something wrong with Amy [name] machine and [employer] came back out to look at it. Amy [name] has not changed Amy [name] filter yet. Amy [name] has not been advised how to wash Amy nasal pillows. Amy caretaker Amy [name] had at Amy time left and Amy [name] has a new caretaker now. Amy [name] may need additional reeducation as far as maintaining Amy [name] CPAP equipment and changing Amy supplies goes. Amy [name] requests that [employer] come back out between Amy hours of 8 AM and 2 PM so Amy [name] caretaker can understand how to change supplies. Of note, Amy [name] presented to Amy emergency room on 09/27/2016 with pain after a recent fall.   09/22/16: (Amy [name]) reports  snoring and excessive daytime somnolence as well as breathing pauses while asleep. Amy [name] Epworth sleepiness score is 14 out of 24, fatigue score is 60 out of 63. Amy [name] is single, Amy [name] does not work. Amy [name] lives alone. Amy [name] has 2 grown children. Amy [name] quit smoking in 1996, and does not currently drink alcohol. Amy [name] drinks caffeine in Amy form of coffee, usually 2 cups in Amy mornings. Amy [name] does not drink sodas or tea on a daily basis.  Amy [name] has a family history of obstructive sleep apnea in Amy [name] sister, maternal aunt, and one cousin. Amy [name] has been witnessed to have breathing pauses while asleep by Amy [name] aide. Amy [name] has an aide typically daily from 8 to 2 PM. Amy [name] has nocturia about twice per average night and has had occasional morning headaches. Bedtime  is around 10 PM and wakeup time around 8 AM. Amy [name] has had lower extremity swelling. Amy [name] tries to elevate Amy [name] legs and sometimes uses compression socks. Amy [name] has tingling in Amy [name] legs. Amy [name] takes Lyrica . I reviewed your office note from 08/01/2016, which you kindly included.      Amy [name] Past Medical History Is Significant For: Past Medical History:  Diagnosis Date   Arthritis    Asthma    Blind    Blindness and low vision    left eye glass eye,  legally blind in right eye   Diabetes mellitus    Diabetic neuropathy (HCC)    Hyperlipidemia    Hypertension    Hypothyroidism    Intractable nausea and vomiting 03/06/2018   Spinal stenosis     Amy [name] Past Surgical History Is Significant For: Past Surgical History:  Procedure Laterality Date   ABDOMINAL HYSTERECTOMY  1990   BREAST BIOPSY Left    BREAST BIOPSY Right    BREAST BIOPSY Right 08/19/2022   US  RT BREAST BX W LOC DEV 1ST LESION IMG BX SPEC US  GUIDE 08/19/2022 [facility]   CESAREAN SECTION  1987, 1990   x 2   CHOLECYSTECTOMY  2008   COLONOSCOPY WITH PROPOFOL  N/A 05/05/2023   Procedure: COLONOSCOPY WITH PROPOFOL ;  Surgeon: Rollin Dover, MD;  Location: [facility];  Service: Gastroenterology;   Laterality: N/A;   ENUCLEATION Bilateral 09/15/1998   EYE SURGERY  2016   fitting artificial eye   POLYPECTOMY  05/05/2023   Procedure: POLYPECTOMY;  Surgeon: Rollin Dover, MD;  Location: [facility];  Service: Gastroenterology;;    Amy [name] Family History Is Significant For: Family History  Problem Relation Age of Onset   Diabetes Sister    Glaucoma Sister    Hypertension Sister    Healthy Mother    Healthy Father    Stroke Brother    Heart attack Paternal Aunt    Breast cancer Neg Hx     Amy [name] Social History Is Significant For: Social History   Socioeconomic History   Marital status: Single    Spouse name: Not on file   Number of children: 2   Years of education: 12   Highest education level: Not on file  Occupational History   Occupation: N/A    Comment: disabled  Tobacco Use   Smoking status: Former    Current packs/day: 0.00    Types: Cigarettes    Quit date: 05/22/1992    Years since quitting: 31.5   Smokeless tobacco: Never  Vaping Use   Vaping status: Never Used  Substance and Sexual Activity   Alcohol use: No    Comment: quit 20 yrs ago   Drug use: No   Sexual activity: Not Currently  Other Topics Concern   Not on file  Social History Narrative   Lives alone   caffeine drinks 2 cups of coffee a day, occasional soda     Right Handed   Social Drivers of Health   Financial Resource Strain: Medium Risk (10/02/2019)   Overall Financial Resource Strain (CARDIA)    Difficulty of Paying Living Expenses: Somewhat hard  Food Insecurity: No Food Insecurity (11/10/2023)   Hunger Vital Sign    Worried About Running Out of Food in Amy Last Year: Never true    Ran Out of Food in Amy Last Year: Never true  Transportation Needs: No Transportation Needs (11/10/2023)   PRAPARE - Transportation    Lack of Transportation (Medical): No    Lack of  Transportation (Non-Medical): No  Physical Activity: Not on file  Stress: Not on file  Social Connections: Unknown  (03/09/2023)   Received from [facility]   Social Network    Social Network: Not on file    Amy [name] Allergies Are:  Allergies  Allergen Reactions   Penicillins Hives and Swelling    Has [name] had a PCN reaction causing immediate rash, facial/tongue/throat swelling, SOB or lightheadedness with hypotension: yes- face swelling Has [name] had a PCN reaction causing severe rash involving mucus membranes or skin necrosis: no Has [name] had a PCN reaction that required hospitalization unknown (childhood allergy) Has [name] had a PCN reaction occurring within Amy last 10 years: no If all of Amy above answers are NO, then may proceed with Cephalosporin use.    Tramadol  Nausea And Vomiting    Intense nausea   Vicodin [Hydrocodone -Acetaminophen ] Nausea And Vomiting   Codeine Nausea Only    Other reaction(s): Unknown   Iodine-131 Hives   Iohexol Hives    Pt developed itching and hives along with nasal congestion; needs 13 hour premeds for future studies, Onset Date: [id number]    Lisinopril Cough    Other reaction(s): Unknown   Sulfa Antibiotics Nausea And Vomiting  :   Amy [name] Current Medications Are:  Outpatient Encounter Medications as of 12/13/2023  Medication Sig   albuterol  (PROVENTIL  HFA;VENTOLIN  HFA) 108 (90 BASE) MCG/ACT inhaler Inhale 2 puffs into Amy lungs every 4 (four) hours as needed for wheezing or shortness of breath. For short   allopurinol  (ZYLOPRIM ) 300 MG tablet Take 300 mg by mouth 2 (two) times daily.   aspirin  EC 81 MG tablet Take 81 mg by mouth daily.   blood glucose meter kit and supplies Dispense based on [name] and insurance preference. Use up to four times daily as directed. (FOR ICD-10 E10.9, E11.9).   Blood Glucose Monitoring Suppl (PRODIGY AUTOCODE BLOOD GLUCOSE) w/Device KIT 1 Device by Does not apply route daily in Amy afternoon.   celecoxib (CELEBREX) 200 MG capsule Take 200 mg by mouth 2 (two) times daily.   cetirizine (ZYRTEC) 10 MG tablet Take 10 mg by  mouth at bedtime.   Continuous Blood Gluc Sensor (DEXCOM G7 SENSOR) MISC 1 Device by Does not apply route as directed.   dapagliflozin  propanediol (FARXIGA ) 10 MG TABS tablet Take 1 tablet (10 mg total) by mouth daily.   docusate sodium  (COLACE) 100 MG capsule Take 100 mg by mouth 2 (two) times daily.   Dulaglutide  (TRULICITY ) 3 MG/0.5ML SOAJ Inject 3 mg as directed once a week. ([name] taking differently: Inject 3 mg as directed once a week. On fridays)   EMBRACE TALK GLUCOSE TEST test strip USE TO CHECK BLOOD SUGARS FOUR TIMES A DAY BEFORE MEALS AND AT BEDTIME DX E11.29   esomeprazole  (NEXIUM ) 40 MG capsule Take 1 capsule (40 mg total) by mouth daily.   fluticasone  (FLONASE ) 50 MCG/ACT nasal spray Place 2 sprays into both nostrils daily.   gabapentin  (NEURONTIN ) 400 MG capsule Take 400 mg by mouth 2 (two) times daily.   glucose blood (ONETOUCH VERIO) test strip Check blood sugar 1-2 times daily   hydrOXYzine  (ATARAX /VISTARIL ) 25 MG tablet Take 0.5 tablets (12.5 mg total) by mouth every 8 (eight) hours as needed for itching.   insulin  degludec (TRESIBA  FLEXTOUCH) 200 UNIT/ML FlexTouch Pen Inject 8 Units into Amy skin daily at 6 (six) AM. ([name] taking differently: Inject 8 Units into Amy skin at bedtime.)   insulin  lispro (HUMALOG ) 100 UNIT/ML KwikPen MAX  DAILY 30 UNITS ([name] taking differently: Inject 0-30 Units into Amy skin 3 (three) times daily. PER SLIDING SCALE MAX DAILY 30 UNITS)   Insulin  Pen Needle 32G X 4 MM MISC 1 Device by Does not apply route in Amy morning, at noon, in Amy evening, and at bedtime.   levothyroxine  (SYNTHROID ) 150 MCG tablet Take 1 tablet (150 mcg total) by mouth daily. Needs appt for future refills   naproxen  (NAPROSYN ) 500 MG tablet Take 500 mg by mouth 2 (two) times daily with a meal.   olmesartan (BENICAR) 40 MG tablet Take 40 mg by mouth daily.   polyethylene glycol powder (MIRALAX ) 17 GM/SCOOP powder Take 17 g by mouth daily as needed for moderate  constipation.   Prodigy Twist Top Lancets 28G MISC USE TO CHECK BLOOD SUGARS   simvastatin  (ZOCOR ) 20 MG tablet Take 1 tablet (20 mg total) by mouth every evening.   No facility-administered encounter medications on file as of 12/13/2023.  :  Review of Systems:  Out of a complete 14 point review of systems, all are reviewed and negative with Amy exception of these symptoms as listed below:   Review of Systems  Neurological:        Doing ok,  ESS 4.      Objective:  Neurological Exam  Physical Exam Physical Examination:   Vitals:   12/13/23 1056  BP: 130/68  Pulse: 60    General Examination: Amy [name] is a very pleasant 63 y.o. female in no acute distress. Amy [name] appears well-developed and well-nourished and well groomed.   HEENT: Normocephalic, atraumatic, visually impaired.  Dark glasses in place. Extraocular tracking is not possible to be tested. Hearing is grossly intact. Face is symmetric with normal facial animation and normal facial sensation. Speech is clear with no dysarthria noted. There is no hypophonia. There is no lip, neck/head, jaw or voice tremor. Oropharynx exam reveals: mild mouth dryness, edentulous, marked airway crowding.  Mallampati class IV.  Tongue protrudes centrally and palate elevates symmetrically.    Chest: Clear to auscultation without wheezing, rhonchi or crackles noted.   Heart: S1+S2+0, regular and normal without murmurs, rubs or gallops noted.    Abdomen: Soft, non-tender and non-distended.   Extremities: There is puffiness noted around Amy ankles bilaterally.      Skin: Warm and dry without trophic changes noted.    Musculoskeletal: exam reveals no obvious joint deformities.    Neurologically:  Mental status: Amy [name] is awake, alert and oriented in all 4 spheres. Amy [name] immediate and remote memory, attention, language skills and fund of knowledge are appropriate. There is no evidence of aphasia, agnosia, apraxia or anomia. Speech is clear  with normal prosody and enunciation. Thought process is linear. Mood is normal and affect is normal.  Cranial nerves II - XII are as described above under HEENT exam. Motor exam: Normal bulk, strength and tone is noted. There is no obvious tremor.  Fine motor skills and coordination: globally mildly impaired.  Cerebellar testing: No obvious dysmetria or intention tremor. There is no truncal or gait ataxia.  Sensory exam: intact to light touch.  Gait, station and balance: Amy [name] stands with difficulty. No veering to one side is noted.  Amy [name] uses a walker.     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 Margaret in 9/17), and obesity, who presents for follow up consultation of Amy [name] sleep apnea, on treatment with CPAP of 10 cm with good compliance, slightly elevated AHI around 7/h currently.  Amy [name] is agreeable to increasing Amy set pressure to 11 cm at this time, mask seal is good.  Tolerance of treatment is good as well.  Amy [name] is commended for treatment adherence.  Of note, Amy [name] split-night sleep study in August 2023 showed severe obstructive sleep apnea.   Amy [name] had a prior titration study in June 2018.   Amy [name] is advised to follow-up routinely in 1 year to see one of our nurse practitioners.    I answered all Amy [name] questions today and Amy [name] was in agreement with our plan.    I spent 30 minutes in total face-to-face time and in reviewing records during pre-charting, more than 50% of which was spent in counseling and coordination of care, reviewing test results, reviewing medications and treatment regimen and/or in discussing or reviewing Amy diagnosis of OSA, Amy prognosis and  treatment options. Pertinent laboratory and imaging test results that were available during this visit with Amy [name] were reviewed by me and considered in my medical decision making (see chart for details).

## 2023-12-18 ENCOUNTER — Telehealth: Payer: Self-pay | Admitting: Podiatry

## 2023-12-18 NOTE — Progress Notes (Signed)
 Pressure change order sent to [facility].

## 2023-12-18 NOTE — Telephone Encounter (Signed)
 Left voicemail with appointment time.8/6/20250 9:45a

## 2023-12-19 NOTE — Progress Notes (Signed)
 New, Maryella Shivers, Otilio Jefferson, RN; Alain Honey; Jeris Penta, [place]; 1 other Received, thank you!

## 2023-12-20 ENCOUNTER — Ambulatory Visit (INDEPENDENT_AMBULATORY_CARE_PROVIDER_SITE_OTHER): Admitting: Podiatry

## 2023-12-20 DIAGNOSIS — Z91198 Patient's noncompliance with other medical treatment and regimen for other reason: Secondary | ICD-10-CM

## 2023-12-20 NOTE — Progress Notes (Signed)
 1. Failure to attend appointment with reason given    Appointment rescheduled by patient.

## 2024-01-09 ENCOUNTER — Ambulatory Visit (INDEPENDENT_AMBULATORY_CARE_PROVIDER_SITE_OTHER): Admitting: Podiatry

## 2024-01-09 ENCOUNTER — Encounter: Payer: Self-pay | Admitting: Podiatry

## 2024-01-09 DIAGNOSIS — E1140 Type 2 diabetes mellitus with diabetic neuropathy, unspecified: Secondary | ICD-10-CM | POA: Diagnosis not present

## 2024-01-09 DIAGNOSIS — M79674 Pain in right toe(s): Secondary | ICD-10-CM

## 2024-01-09 DIAGNOSIS — B351 Tinea unguium: Secondary | ICD-10-CM | POA: Diagnosis not present

## 2024-01-09 DIAGNOSIS — M79675 Pain in left toe(s): Secondary | ICD-10-CM

## 2024-01-09 DIAGNOSIS — Z794 Long term (current) use of insulin: Secondary | ICD-10-CM

## 2024-01-13 NOTE — Progress Notes (Signed)
 Subjective:  Patient ID: Amy [name], female    DOB: [birth year],  MRN: [id number]  Amy [name] presents to clinic today for at risk foot care with history of diabetic neuropathy and painful mycotic toenails x 10 which interfere with daily activities. Pain is relieved with periodic professional debridement. Her sister is present during today's visit. Chief Complaint  Patient presents with   RFC    Rm15 Diabetic foot care/ Dr. Morano last visit August 2025/ A1c 7   New problem(s): None.   PCP is Sigrid Kays, [place], MD.  Allergies  Allergen Reactions   Penicillins Hives and Swelling    Has patient had a PCN reaction causing immediate rash, facial/tongue/throat swelling, SOB or lightheadedness with hypotension: yes- face swelling Has patient had a PCN reaction causing severe rash involving mucus membranes or skin necrosis: no Has patient had a PCN reaction that required hospitalization unknown (childhood allergy) Has patient had a PCN reaction occurring within the last 10 years: no If all of the above answers are NO, then may proceed with Cephalosporin use.    Tramadol  Nausea And Vomiting    Intense nausea   Vicodin [Hydrocodone -Acetaminophen ] Nausea And Vomiting   Codeine Nausea Only    Other reaction(s): Unknown   Iodine-131 Hives   Iohexol Hives    Pt developed itching and hives along with nasal congestion; needs 13 hour premeds for future studies, Onset Date: [id number]    Lisinopril Cough    Other reaction(s): Unknown   Sulfa Antibiotics Nausea And Vomiting    Review of Systems: Negative except as noted in the HPI.  Objective: No changes noted in today's physical examination. There were no vitals filed for this visit. Amy [name] is a pleasant 63 y.o. female in NAD. AAO x 3.  Vascular Examination: CFT <3 seconds b/l. DP pulses faintly palpable b/l. PT pulses nonpalpable b/l. Digital hair absent. Trace edema b/l.  Skin temperature gradient warm to warm b/l. No  pain with calf compression. No ischemia or gangrene. No cyanosis or clubbing noted b/l.    Neurological Examination: Sensation grossly intact b/l with 10 gram monofilament. Vibratory sensation intact b/l. Pt has subjective symptoms of neuropathy.  Dermatological Examination: Pedal skin warm and supple b/l. No open wounds b/l. No interdigital macerations. Toenails 1-5 b/l thick, discolored, elongated with subungual debris and pain on dorsal palpation. No hyperkeratotic nor porokeratotic lesions.  Musculoskeletal Examination: Muscle strength 5/5 to all lower extremity muscle groups bilaterally. No pain, crepitus or joint limitation noted with ROM b/l LE. HAV with bunion bilaterally and hammertoes 2-5 b/l.Amy [name] Patient ambulates with rollator assistance.  Radiographs: None  Last HgA1c:      Latest Ref Rng & Units 11/10/2023    1:06 AM 03/23/2023   10:59 AM  Hemoglobin A1C  Hemoglobin-A1c 4.8 - 5.6 % 6.9  7.7     Assessment/Plan: 1. Pain due to onychomycosis of toenails of both feet   2. Type 2 diabetes mellitus with diabetic neuropathy, with long-term current use of insulin  [facility])   -Patient was evaluated and treated. All patient's and/or POA's questions/concerns addressed on today's visit. Mycotic toenails 1-5 debrided in length and girth without incident.  Continue daily foot inspections and monitor blood glucose per PCP/Endocrinologist's recommendations.Continue soft, supportive shoe gear daily. Report any pedal injuries to medical professional. Call office if there are any quesitons/concerns. -Patient's family member present. All questions/concerns addressed on today's visit. Return in about 3 months (around 04/10/2024).  Amy [name], DPM   LOCATION: [address], [place] [address]                   Office [phone]   [facility] LOCATION: [address] [place], [place] [address] Office [phone]

## 2024-02-19 ENCOUNTER — Ambulatory Visit: Admitting: Family Medicine

## 2024-03-01 ENCOUNTER — Emergency Department (HOSPITAL_COMMUNITY)

## 2024-03-01 ENCOUNTER — Emergency Department (HOSPITAL_COMMUNITY)
Admission: EM | Admit: 2024-03-01 | Discharge: 2024-03-02 | Disposition: A | Discharge status: Home / Self Care | Source: Home / Self Care | Attending: Emergency Medicine | Admitting: Emergency Medicine

## 2024-03-01 DIAGNOSIS — J45909 Unspecified asthma, uncomplicated: Secondary | ICD-10-CM | POA: Insufficient documentation

## 2024-03-01 DIAGNOSIS — Z79899 Other long term (current) drug therapy: Secondary | ICD-10-CM | POA: Insufficient documentation

## 2024-03-01 DIAGNOSIS — E11649 Type 2 diabetes mellitus with hypoglycemia without coma: Secondary | ICD-10-CM | POA: Insufficient documentation

## 2024-03-01 DIAGNOSIS — R42 Dizziness and giddiness: Secondary | ICD-10-CM | POA: Insufficient documentation

## 2024-03-01 DIAGNOSIS — R7881 Bacteremia: Secondary | ICD-10-CM | POA: Diagnosis not present

## 2024-03-01 DIAGNOSIS — I130 Hypertensive heart and chronic kidney disease with heart failure and stage 1 through stage 4 chronic kidney disease, or unspecified chronic kidney disease: Secondary | ICD-10-CM | POA: Insufficient documentation

## 2024-03-01 DIAGNOSIS — R600 Localized edema: Secondary | ICD-10-CM | POA: Insufficient documentation

## 2024-03-01 DIAGNOSIS — I959 Hypotension, unspecified: Secondary | ICD-10-CM

## 2024-03-01 DIAGNOSIS — E1122 Type 2 diabetes mellitus with diabetic chronic kidney disease: Secondary | ICD-10-CM | POA: Insufficient documentation

## 2024-03-01 DIAGNOSIS — E039 Hypothyroidism, unspecified: Secondary | ICD-10-CM | POA: Insufficient documentation

## 2024-03-01 DIAGNOSIS — Z7982 Long term (current) use of aspirin: Secondary | ICD-10-CM | POA: Insufficient documentation

## 2024-03-01 DIAGNOSIS — Z79890 Hormone replacement therapy (postmenopausal): Secondary | ICD-10-CM | POA: Insufficient documentation

## 2024-03-01 DIAGNOSIS — Z794 Long term (current) use of insulin: Secondary | ICD-10-CM | POA: Insufficient documentation

## 2024-03-01 DIAGNOSIS — I509 Heart failure, unspecified: Secondary | ICD-10-CM | POA: Insufficient documentation

## 2024-03-01 DIAGNOSIS — N189 Chronic kidney disease, unspecified: Secondary | ICD-10-CM | POA: Insufficient documentation

## 2024-03-01 DIAGNOSIS — M546 Pain in thoracic spine: Secondary | ICD-10-CM | POA: Insufficient documentation

## 2024-03-01 DIAGNOSIS — R7989 Other specified abnormal findings of blood chemistry: Secondary | ICD-10-CM | POA: Insufficient documentation

## 2024-03-01 DIAGNOSIS — N3000 Acute cystitis without hematuria: Secondary | ICD-10-CM

## 2024-03-01 DIAGNOSIS — E162 Hypoglycemia, unspecified: Secondary | ICD-10-CM

## 2024-03-01 LAB — RESP PANEL BY RT-PCR (RSV, FLU A&B, COVID)  RVPGX2
Influenza A by PCR: NEGATIVE
Influenza B by PCR: NEGATIVE
Resp Syncytial Virus by PCR: NEGATIVE
SARS Coronavirus 2 by RT PCR: NEGATIVE

## 2024-03-01 LAB — CBC
HCT: 40.8 % (ref 36.0–46.0)
Hemoglobin: 12.5 g/dL (ref 12.0–15.0)
MCH: 25.8 pg — ABNORMAL LOW (ref 26.0–34.0)
MCHC: 30.6 g/dL (ref 30.0–36.0)
MCV: 84.1 fL (ref 80.0–100.0)
Platelets: 153 K/uL (ref 150–400)
RBC: 4.85 MIL/uL (ref 3.87–5.11)
RDW: 15.6 % — ABNORMAL HIGH (ref 11.5–15.5)
WBC: 7.4 K/uL (ref 4.0–10.5)
nRBC: 0 % (ref 0.0–0.2)

## 2024-03-01 LAB — I-STAT CHEM 8, ED
BUN: 23 mg/dL (ref 8–23)
Calcium, Ion: 1.26 mmol/L (ref 1.15–1.40)
Chloride: 109 mmol/L (ref 98–111)
Creatinine, Ser: 1.9 mg/dL — ABNORMAL HIGH (ref 0.44–1.00)
Glucose, Bld: 31 mg/dL — CL (ref 70–99)
HCT: 38 % (ref 36.0–46.0)
Hemoglobin: 12.9 g/dL (ref 12.0–15.0)
Potassium: 3.5 mmol/L (ref 3.5–5.1)
Sodium: 148 mmol/L — ABNORMAL HIGH (ref 135–145)
TCO2: 26 mmol/L (ref 22–32)

## 2024-03-01 LAB — CBG MONITORING, ED
Glucose-Capillary: 41 mg/dL — CL (ref 70–99)
Glucose-Capillary: 75 mg/dL (ref 70–99)
Glucose-Capillary: 91 mg/dL (ref 70–99)

## 2024-03-01 LAB — COMPREHENSIVE METABOLIC PANEL WITH GFR
ALT: 8 U/L (ref 0–44)
AST: 27 U/L (ref 15–41)
Albumin: 3.2 g/dL — ABNORMAL LOW (ref 3.5–5.0)
Alkaline Phosphatase: 80 U/L (ref 38–126)
Anion gap: 9 (ref 5–15)
BUN: 20 mg/dL (ref 8–23)
CO2: 25 mmol/L (ref 22–32)
Calcium: 9.5 mg/dL (ref 8.9–10.3)
Chloride: 112 mmol/L — ABNORMAL HIGH (ref 98–111)
Creatinine, Ser: 1.6 mg/dL — ABNORMAL HIGH (ref 0.44–1.00)
GFR, Estimated: 36 mL/min — ABNORMAL LOW (ref >60)
Glucose, Bld: 32 mg/dL — CL (ref 70–99)
Potassium: 3.7 mmol/L (ref 3.5–5.1)
Sodium: 147 mmol/L — ABNORMAL HIGH (ref 135–145)
Total Bilirubin: 0.3 mg/dL (ref 0.0–1.2)
Total Protein: 5.8 g/dL — ABNORMAL LOW (ref 6.5–8.1)

## 2024-03-01 LAB — I-STAT CG4 LACTIC ACID, ED: Lactic Acid, Venous: 1.9 mmol/L (ref 0.5–1.9)

## 2024-03-01 LAB — D-DIMER, QUANTITATIVE: D-Dimer, Quant: 1.42 ug{FEU}/mL — ABNORMAL HIGH (ref 0.00–0.50)

## 2024-03-01 MED ORDER — ACETAMINOPHEN 500 MG PO TABS
1000.0000 mg | ORAL_TABLET | Freq: Once | ORAL | Status: AC
  Administered 2024-03-02: 1000 mg via ORAL
  Filled 2024-03-01: qty 2

## 2024-03-01 NOTE — ED Triage Notes (Signed)
 Patient arrived with complaints of nausea, dizziness, and weakness. 74/40 on EMS arrival. Reports her blood sugar has been running low. 500cc NS given with EMS.

## 2024-03-01 NOTE — ED Notes (Signed)
 Pt ambulated to bathroom with assistance from staff. Pt noted to be steady on her feet, but reported dizziness, and stated she felt like the room was spinning

## 2024-03-01 NOTE — ED Provider Notes (Signed)
 [place] [facility]GENCY DEPARTMENT AT [facility] Provider Note   CSN: [id number] Arrival date & time: 03/01/24  2019     Patient presents with: Hypotension   Amy [name] is a 63 y.o. female.   Patient is a 63 year old female with a history of diabetes, chronic kidney disease, blindness, CHF, hyperlipidemia who presents with lightheadedness and low blood sugar.  She said that she has been feeling a little bit worse over the last 2 to 3 days.  She has had some intermittent diarrhea although she says that is not uncommon for her.  She has also had an episode of vomiting today although she ate a large meal this evening and did not have any vomiting after that.  She however has been feeling dizzy and lightheaded today.  She feels lightheaded when she tries to stand up and walk.  No spinning sensation.  No numbness or weakness to her extremities.  No known fevers.  She has had a little bit of coughing and a little bit of congestion.  EMS noted her blood pressure to be low on arrival at 74/40.  She was given 500 cc of normal saline.  Her blood pressure on arrival was 119/59.  She denies any recent medication changes.  She also has some right upper back pain.  She has some chronic lower back pain but does not typically have pain in her upper back.  It is worse with movement, worse with breathing although she does not really feel short of breath.  No anterior chest pain.       Prior to Admission medications   Medication Sig Start Date End Date Taking? Authorizing Provider  albuterol  (PROVENTIL  HFA;VENTOLIN  HFA) 108 (90 BASE) MCG/ACT inhaler Inhale 2 puffs into the lungs every 4 (four) hours as needed for wheezing or shortness of breath. For short    [provider]  allopurinol  (ZYLOPRIM ) 300 MG tablet Take 300 mg by mouth 2 (two) times daily. 11/23/20   [provider]  aspirin  EC 81 MG tablet Take 81 mg by mouth daily.    [provider]  blood glucose meter  kit and supplies Dispense based on patient and insurance preference. Use up to four times daily as directed. (FOR ICD-10 E10.9, E11.9). 08/06/20   Levora Reyes SAUNDERS, MD  Blood Glucose Monitoring Suppl (PRODIGY AUTOCODE BLOOD GLUCOSE) w/Device KIT 1 Device by Does not apply route daily in the afternoon. 09/21/23   Shamleffer, Ibtehal Jaralla, MD  celecoxib (CELEBREX) 200 MG capsule Take 200 mg by mouth 2 (two) times daily. 02/14/19   [provider]  cetirizine (ZYRTEC) 10 MG tablet Take 10 mg by mouth at bedtime.    [provider]  Continuous Blood Gluc Sensor (DEXCOM G7 SENSOR) MISC 1 Device by Does not apply route as directed. 10/19/21   Shamleffer, Ibtehal Jaralla, MD  dapagliflozin  propanediol (FARXIGA ) 10 MG TABS tablet Take 1 tablet (10 mg total) by mouth daily. 09/21/23   Shamleffer, Ibtehal Jaralla, MD  docusate sodium  (COLACE) 100 MG capsule Take 100 mg by mouth 2 (two) times daily. 10/26/22   [provider]  Dulaglutide  (TRULICITY ) 3 MG/0.5ML SOAJ Inject 3 mg as directed once a week. Patient taking differently: Inject 3 mg as directed once a week. On fridays 09/21/23   Shamleffer, Ibtehal Jaralla, MD  EMBRACE TALK GLUCOSE TEST test strip USE TO CHECK BLOOD SUGARS FOUR TIMES A DAY BEFORE MEALS AND AT BEDTIME DX E11.29 07/29/20   Levora Reyes SAUNDERS, MD  esomeprazole  (NEXIUM ) 40 MG capsule Take 1 capsule (40 mg total) by mouth daily. 07/10/20   Bensimhon, Toribio SAUNDERS, MD  fluticasone  (FLONASE ) 50 MCG/ACT nasal spray Place 2 sprays into both nostrils daily. 12/16/22   [provider]  gabapentin  (NEURONTIN ) 400 MG capsule Take 400 mg by mouth 2 (two) times daily. 08/10/21   [provider]  glucose blood (ONETOUCH VERIO) test strip Check blood sugar 1-2 times daily 09/21/23   Shamleffer, Donell Cardinal, MD  hydrOXYzine  (ATARAX /VISTARIL ) 25 MG tablet Take 0.5 tablets (12.5 mg total) by mouth every 8 (eight) hours as needed for itching. 12/05/20   Christopher Savannah, PA-C   insulin  degludec (TRESIBA  FLEXTOUCH) 200 UNIT/ML FlexTouch Pen Inject 8 Units into the skin daily at 6 (six) AM. Patient taking differently: Inject 8 Units into the skin at bedtime. 03/23/23   Shamleffer, Ibtehal Jaralla, MD  insulin  lispro (HUMALOG ) 100 UNIT/ML KwikPen MAX DAILY 30 UNITS Patient taking differently: Inject 0-30 Units into the skin 3 (three) times daily. PER SLIDING SCALE MAX DAILY 30 UNITS 09/21/23   Shamleffer, Donell Cardinal, MD  Insulin  Pen Needle 32G X 4 MM MISC 1 Device by Does not apply route in the morning, at noon, in the evening, and at bedtime. 09/21/23   Shamleffer, Ibtehal Jaralla, MD  levothyroxine  (SYNTHROID ) 150 MCG tablet Take 1 tablet (150 mcg total) by mouth daily. Needs appt for future refills 08/06/20   Levora Reyes SAUNDERS, MD  naproxen  (NAPROSYN ) 500 MG tablet Take 500 mg by mouth 2 (two) times daily with a meal.    [provider]  olmesartan (BENICAR) 40 MG tablet Take 40 mg by mouth daily. 02/01/23   [provider]  polyethylene glycol powder (MIRALAX ) 17 GM/SCOOP powder Take 17 g by mouth daily as needed for moderate constipation. 08/03/23   [provider]  Prodigy Twist Top Lancets 28G MISC USE TO CHECK BLOOD SUGARS 10/02/20   [provider]  simvastatin  (ZOCOR ) 20 MG tablet Take 1 tablet (20 mg total) by mouth every evening. 08/06/20   Levora Reyes SAUNDERS, MD    Allergies: Penicillins, Tramadol , Vicodin [hydrocodone -acetaminophen ], Codeine, Iodine-131, Iohexol, Lisinopril, and Sulfa antibiotics    Review of Systems  Constitutional:  Negative for chills, diaphoresis, fatigue and fever.  HENT:  Negative for congestion, rhinorrhea and sneezing.   Eyes: Negative.   Respiratory:  Positive for cough and shortness of breath. Negative for chest tightness.   Cardiovascular:  Negative for chest pain and leg swelling.  Gastrointestinal:  Positive for diarrhea, nausea and vomiting. Negative for abdominal pain.  Genitourinary:  Negative  for difficulty urinating, flank pain, frequency and hematuria.  Musculoskeletal:  Negative for arthralgias and back pain.  Skin:  Negative for rash.  Neurological:  Negative for dizziness, speech difficulty, weakness, numbness and headaches.    Updated Vital Signs BP 138/68   Pulse (!) 56   Temp 98 F (36.7 C) (Oral)   Resp (!) 0   SpO2 94%   Physical Exam Constitutional:      Appearance: She is well-developed.  HENT:     Head: Normocephalic and atraumatic.  Eyes:     Comments: Blindness  Cardiovascular:     Rate and Rhythm: Normal rate and regular rhythm.     Heart sounds: Normal heart sounds.  Pulmonary:     Effort: Pulmonary effort is normal. No respiratory distress.     Breath sounds: Normal breath sounds. No wheezing or rales.  Chest:     Chest wall: No  tenderness.  Abdominal:     General: Bowel sounds are normal.     Palpations: Abdomen is soft.     Tenderness: There is no abdominal tenderness. There is no guarding or rebound.  Musculoskeletal:        General: Normal range of motion.     Cervical back: Normal range of motion and neck supple.  Lymphadenopathy:     Cervical: No cervical adenopathy.  Skin:    General: Skin is warm and dry.     Findings: No rash.  Neurological:     General: No focal deficit present.     Mental Status: She is alert and oriented to person, place, and time.     (all labs ordered are listed, but only abnormal results are displayed) Labs Reviewed  COMPREHENSIVE METABOLIC PANEL WITH GFR - Abnormal; Notable for the following components:      Result Value   Sodium 147 (*)    Chloride 112 (*)    Glucose, Bld 32 (*)    Creatinine, Ser 1.60 (*)    Total Protein 5.8 (*)    Albumin  3.2 (*)    GFR, Estimated 36 (*)    All other components within normal limits  CBC - Abnormal; Notable for the following components:   MCH 25.8 (*)    RDW 15.6 (*)    All other components within normal limits  CBG MONITORING, ED - Abnormal; Notable for  the following components:   Glucose-Capillary 41 (*)    All other components within normal limits  I-STAT CHEM 8, ED - Abnormal; Notable for the following components:   Sodium 148 (*)    Creatinine, Ser 1.90 (*)    Glucose, Bld 31 (*)    All other components within normal limits  CULTURE, BLOOD (ROUTINE X 2)  CULTURE, BLOOD (ROUTINE X 2)  RESP PANEL BY RT-PCR (RSV, FLU A&B, COVID)  [id number]  URINALYSIS, ROUTINE W REFLEX MICROSCOPIC  I-STAT CG4 LACTIC ACID, ED  CBG MONITORING, ED  CBG MONITORING, ED    EKG: EKG Interpretation Date/Time:  Friday March 01 2024 20:27:45 EDT Ventricular Rate:  58 PR Interval:  131 QRS Duration:  99 QT Interval:  438 QTC Calculation: 431 R Axis:   37  Text Interpretation: Sinus rhythm Atrial premature complex Borderline T wave abnormalities since last tracing no significant change Confirmed by Lenor Hollering [phone]) on 03/01/2024 9:04:55 PM  Radiology: [facility] Chest 2 View Result Date: 03/01/2024 CLINICAL DATA:  Cough, nausea, dizziness, weakness EXAM: CHEST - 2 VIEW COMPARISON:  11/09/2023 FINDINGS: Frontal and lateral views of the chest demonstrate an unremarkable cardiac silhouette. No acute airspace disease, effusion, or pneumothorax. No acute bony abnormalities. IMPRESSION: 1. No acute intrathoracic process. Electronically Signed   By: Ozell Daring M.D.   On: 03/01/2024 21:52     Procedures   Medications Ordered in the ED - No data to display                                  Medical Decision Making Amount and/or Complexity of Data Reviewed Labs: ordered. Radiology: ordered.   This patient presents to the ED for concern of low blood sugar, low blood pressure, this involves an extensive number of treatment options, and is a complaint that carries with it a high risk of complications and morbidity.  I considered the following differential and admission for this acute, potentially life threatening condition.  The  differential diagnosis  includes medication reaction, sepsis, dehydration, acute kidney failure, anemia, GI bleed  MDM:    Patient is a 63 year old female who presents with lightheadedness and low blood sugar.  She was noted to be hypotensive on EMS arrival but has not had any episodes of hypotension here in the ED.  Labs show a normal white count.  She has an elevated creatinine but is actually little better than her most recent values.  Lactic acid is normal.  Chest x-ray does not show any evidence of pneumonia.  She was noted to be hypoglycemic on arrival with blood glucose around 30.  She denies any recent change in her medications.  She is fully alert and oriented.  She was given a sandwich and some juice and her glucose has improved.  She did have some pain in her right upper back.  She says it is worse with movement and worse with breathing.  She does not have any hypoxia.  No tachycardia, she has some mild edema to her lower extremities but no unilateral swelling or calf tenderness.  D-dimer is pending.  Care turned over to Dr. Trine pending d dimer and u/a.  Will also need a recheck on her cbg to make sure it is stable.  (Labs, imaging, consults)  Labs: I Ordered, and personally interpreted labs.  The pertinent results include: Normal white count, elevated creatinine but similar to prior values, hypoglycemia  Imaging Studies ordered: I ordered imaging studies including chest x-ray I independently visualized and interpreted imaging. I agree with the radiologist interpretation  Additional history obtained from chart.  External records from outside source obtained and reviewed including prior notes  Cardiac Monitoring: The patient was maintained on a cardiac monitor.  If on the cardiac monitor, I personally viewed and interpreted the cardiac monitored which showed an underlying rhythm of: Sinus bradycardia  Reevaluation: After the interventions noted above, I reevaluated the patient and found that they have  :improved  Social Determinants of Health:  blind  Disposition:  pending  Co morbidities that complicate the patient evaluation  Past Medical History:  Diagnosis Date   Arthritis    Asthma    Blind    Blindness and low vision    left eye glass eye,  legally blind in right eye   Diabetes mellitus    Diabetic neuropathy (HCC)    Hyperlipidemia    Hypertension    Hypothyroidism    Intractable nausea and vomiting 03/06/2018   Spinal stenosis      Medicines No orders of the defined types were placed in this encounter.   I have reviewed the patients home medicines and have made adjustments as needed  Problem List / ED Course: Problem List Items Addressed This Visit   None            Final diagnoses:  None    ED Discharge Orders     None          Lenor Hollering, MD 03/01/24 2354

## 2024-03-02 LAB — URINALYSIS, ROUTINE W REFLEX MICROSCOPIC
Bilirubin Urine: NEGATIVE
Glucose, UA: >=500 mg/dL — AB
Hgb urine dipstick: NEGATIVE
Ketones, ur: NEGATIVE mg/dL
Nitrite: NEGATIVE
Protein, ur: 100 mg/dL — AB
Specific Gravity, Urine: 1.014 (ref 1.005–1.030)
pH: 5 (ref 5.0–8.0)

## 2024-03-02 LAB — CBG MONITORING, ED
Glucose-Capillary: 125 mg/dL — ABNORMAL HIGH (ref 70–99)
Glucose-Capillary: 195 mg/dL — ABNORMAL HIGH (ref 70–99)
Glucose-Capillary: 270 mg/dL — ABNORMAL HIGH (ref 70–99)

## 2024-03-02 MED ORDER — DIPHENHYDRAMINE HCL 25 MG PO CAPS: 50.0000 mg | ORAL_CAPSULE | Freq: Once | ORAL | Status: DC

## 2024-03-02 MED ORDER — CEPHALEXIN 500 MG PO CAPS: 500.0000 mg | ORAL_CAPSULE | Freq: Two times a day (BID) | ORAL | 0 refills | Status: DC

## 2024-03-02 MED ORDER — NITROFURANTOIN MONOHYD MACRO 100 MG PO CAPS
100.0000 mg | ORAL_CAPSULE | Freq: Once | ORAL | Status: AC
  Administered 2024-03-02: 100 mg via ORAL
  Filled 2024-03-02: qty 1

## 2024-03-02 MED ORDER — DIPHENHYDRAMINE HCL 50 MG/ML IJ SOLN: 50.0000 mg | Freq: Once | INTRAMUSCULAR | Status: DC

## 2024-03-02 MED ORDER — METHYLPREDNISOLONE SODIUM SUCC 40 MG IJ SOLR
40.0000 mg | Freq: Once | INTRAMUSCULAR | Status: AC
  Administered 2024-03-02: 40 mg via INTRAVENOUS
  Filled 2024-03-02: qty 1

## 2024-03-02 MED ORDER — METHYLPREDNISOLONE SODIUM SUCC 40 MG IJ SOLR: 40.0000 mg | Freq: Once | INTRAMUSCULAR | Status: DC

## 2024-03-02 MED ORDER — CEPHALEXIN 500 MG PO CAPS: 500.0000 mg | ORAL_CAPSULE | Freq: Four times a day (QID) | ORAL | 0 refills | Status: DC

## 2024-03-02 MED ORDER — INSULIN LISPRO (1 UNIT DIAL) 100 UNIT/ML (KWIKPEN): 0.0000 [IU] | PEN_INJECTOR | Freq: Three times a day (TID) | SUBCUTANEOUS | Status: DC

## 2024-03-02 MED ORDER — DAPAGLIFLOZIN PROPANEDIOL 10 MG PO TABS: 10.0000 mg | ORAL_TABLET | Freq: Every day | ORAL | Status: DC

## 2024-03-02 NOTE — Discharge Instructions (Addendum)
 Your workup was overall reassuring today.  Your sugar was low but has improved.  We do not think we need to get the CAT scan the chest.  However your urine looked as if there could be an infection.  We will treat with some antibiotics.

## 2024-03-02 NOTE — ED Notes (Signed)
 Pt ambulated to restroom with unsteady gait. Pt can walk better with a walker but still need assistance.

## 2024-03-03 ENCOUNTER — Encounter: Payer: Self-pay | Admitting: Infectious Diseases

## 2024-03-03 LAB — BLOOD CULTURE ID PANEL (REFLEXED) - BCID2
A.calcoaceticus-baumannii: NOT DETECTED
Bacteroides fragilis: NOT DETECTED
Candida albicans: NOT DETECTED
Candida auris: NOT DETECTED
Candida glabrata: NOT DETECTED
Candida krusei: NOT DETECTED
Candida parapsilosis: NOT DETECTED
Candida tropicalis: NOT DETECTED
Cryptococcus neoformans/gattii: NOT DETECTED
Enterobacter cloacae complex: NOT DETECTED
Enterobacterales: NOT DETECTED
Enterococcus Faecium: NOT DETECTED
Enterococcus faecalis: NOT DETECTED
Escherichia coli: NOT DETECTED
Haemophilus influenzae: NOT DETECTED
Klebsiella aerogenes: NOT DETECTED
Klebsiella oxytoca: NOT DETECTED
Klebsiella pneumoniae: NOT DETECTED
Listeria monocytogenes: NOT DETECTED
Meth resistant mecA/C and MREJ: DETECTED — AB
Methicillin resistance mecA/C: DETECTED — AB
Neisseria meningitidis: NOT DETECTED
Proteus species: NOT DETECTED
Pseudomonas aeruginosa: NOT DETECTED
Salmonella species: NOT DETECTED
Serratia marcescens: NOT DETECTED
Staphylococcus aureus (BCID): DETECTED — AB
Staphylococcus epidermidis: DETECTED — AB
Staphylococcus lugdunensis: NOT DETECTED
Staphylococcus species: DETECTED — AB
Stenotrophomonas maltophilia: NOT DETECTED
Streptococcus agalactiae: NOT DETECTED
Streptococcus pneumoniae: NOT DETECTED
Streptococcus pyogenes: NOT DETECTED
Streptococcus species: NOT DETECTED

## 2024-03-03 NOTE — Progress Notes (Addendum)
 Attempted to call patient for positive blood culture  results with Staph aureus and Staph epidermidis in blood culture # [phone] and need to come back to the hospital. Unable to connect or leave a voice mail.    Addendum 4: 31 pm  Called back again at listed phone number in chart and could not connect or leave a voice mail.   Annalee Orem, MD Infectious Disease Physician [facility] [address] [place], [place] [address] Phone: [phone]  Fax: [phone]

## 2024-03-04 ENCOUNTER — Encounter (HOSPITAL_COMMUNITY): Payer: Self-pay

## 2024-03-04 ENCOUNTER — Emergency Department (HOSPITAL_COMMUNITY)

## 2024-03-04 ENCOUNTER — Other Ambulatory Visit: Payer: Self-pay

## 2024-03-04 ENCOUNTER — Telehealth (HOSPITAL_COMMUNITY): Payer: Self-pay | Admitting: Pharmacist

## 2024-03-04 ENCOUNTER — Inpatient Hospital Stay (HOSPITAL_COMMUNITY)
Admission: EM | Admit: 2024-03-04 | Discharge: 2024-03-07 | DRG: 872 | Disposition: A | Discharge status: Home / Self Care | Source: Ambulatory Visit | Attending: Internal Medicine | Admitting: Internal Medicine

## 2024-03-04 DIAGNOSIS — R7881 Bacteremia: Secondary | ICD-10-CM | POA: Diagnosis present

## 2024-03-04 DIAGNOSIS — I959 Hypotension, unspecified: Secondary | ICD-10-CM | POA: Diagnosis present

## 2024-03-04 DIAGNOSIS — Z91041 Radiographic dye allergy status: Secondary | ICD-10-CM

## 2024-03-04 DIAGNOSIS — Z823 Family history of stroke: Secondary | ICD-10-CM

## 2024-03-04 DIAGNOSIS — E1040 Type 1 diabetes mellitus with diabetic neuropathy, unspecified: Secondary | ICD-10-CM | POA: Diagnosis present

## 2024-03-04 DIAGNOSIS — E1122 Type 2 diabetes mellitus with diabetic chronic kidney disease: Secondary | ICD-10-CM

## 2024-03-04 DIAGNOSIS — H548 Legal blindness, as defined in USA: Secondary | ICD-10-CM | POA: Diagnosis present

## 2024-03-04 DIAGNOSIS — E1022 Type 1 diabetes mellitus with diabetic chronic kidney disease: Secondary | ICD-10-CM | POA: Diagnosis present

## 2024-03-04 DIAGNOSIS — E1039 Type 1 diabetes mellitus with other diabetic ophthalmic complication: Secondary | ICD-10-CM | POA: Diagnosis present

## 2024-03-04 DIAGNOSIS — Z888 Allergy status to other drugs, medicaments and biological substances status: Secondary | ICD-10-CM

## 2024-03-04 DIAGNOSIS — Z1152 Encounter for screening for COVID-19: Secondary | ICD-10-CM | POA: Diagnosis not present

## 2024-03-04 DIAGNOSIS — Z882 Allergy status to sulfonamides status: Secondary | ICD-10-CM

## 2024-03-04 DIAGNOSIS — G8929 Other chronic pain: Secondary | ICD-10-CM | POA: Diagnosis present

## 2024-03-04 DIAGNOSIS — Z880 Allergy status to penicillin: Secondary | ICD-10-CM

## 2024-03-04 DIAGNOSIS — Z7984 Long term (current) use of oral hypoglycemic drugs: Secondary | ICD-10-CM

## 2024-03-04 DIAGNOSIS — E785 Hyperlipidemia, unspecified: Secondary | ICD-10-CM | POA: Diagnosis present

## 2024-03-04 DIAGNOSIS — N189 Chronic kidney disease, unspecified: Secondary | ICD-10-CM | POA: Diagnosis not present

## 2024-03-04 DIAGNOSIS — K219 Gastro-esophageal reflux disease without esophagitis: Secondary | ICD-10-CM | POA: Diagnosis present

## 2024-03-04 DIAGNOSIS — I10 Essential (primary) hypertension: Secondary | ICD-10-CM | POA: Diagnosis present

## 2024-03-04 DIAGNOSIS — E10649 Type 1 diabetes mellitus with hypoglycemia without coma: Secondary | ICD-10-CM | POA: Diagnosis present

## 2024-03-04 DIAGNOSIS — E1065 Type 1 diabetes mellitus with hyperglycemia: Secondary | ICD-10-CM | POA: Diagnosis present

## 2024-03-04 DIAGNOSIS — Z83511 Family history of glaucoma: Secondary | ICD-10-CM

## 2024-03-04 DIAGNOSIS — J45909 Unspecified asthma, uncomplicated: Secondary | ICD-10-CM | POA: Diagnosis present

## 2024-03-04 DIAGNOSIS — Z7982 Long term (current) use of aspirin: Secondary | ICD-10-CM

## 2024-03-04 DIAGNOSIS — Z791 Long term (current) use of non-steroidal anti-inflammatories (NSAID): Secondary | ICD-10-CM

## 2024-03-04 DIAGNOSIS — R197 Diarrhea, unspecified: Secondary | ICD-10-CM | POA: Diagnosis not present

## 2024-03-04 DIAGNOSIS — E039 Hypothyroidism, unspecified: Secondary | ICD-10-CM | POA: Diagnosis present

## 2024-03-04 DIAGNOSIS — I5032 Chronic diastolic (congestive) heart failure: Secondary | ICD-10-CM | POA: Diagnosis present

## 2024-03-04 DIAGNOSIS — G4733 Obstructive sleep apnea (adult) (pediatric): Secondary | ICD-10-CM | POA: Diagnosis present

## 2024-03-04 DIAGNOSIS — B9562 Methicillin resistant Staphylococcus aureus infection as the cause of diseases classified elsewhere: Secondary | ICD-10-CM | POA: Diagnosis present

## 2024-03-04 DIAGNOSIS — E66811 Obesity, class 1: Secondary | ICD-10-CM | POA: Diagnosis present

## 2024-03-04 DIAGNOSIS — N1831 Chronic kidney disease, stage 3a: Secondary | ICD-10-CM | POA: Diagnosis present

## 2024-03-04 DIAGNOSIS — M549 Dorsalgia, unspecified: Secondary | ICD-10-CM | POA: Diagnosis not present

## 2024-03-04 DIAGNOSIS — Z79890 Hormone replacement therapy (postmenopausal): Secondary | ICD-10-CM

## 2024-03-04 DIAGNOSIS — B958 Unspecified staphylococcus as the cause of diseases classified elsewhere: Secondary | ICD-10-CM | POA: Diagnosis not present

## 2024-03-04 DIAGNOSIS — Z6833 Body mass index (BMI) 33.0-33.9, adult: Secondary | ICD-10-CM | POA: Diagnosis not present

## 2024-03-04 DIAGNOSIS — E0840 Diabetes mellitus due to underlying condition with diabetic neuropathy, unspecified: Secondary | ICD-10-CM

## 2024-03-04 DIAGNOSIS — M545 Low back pain, unspecified: Secondary | ICD-10-CM | POA: Diagnosis present

## 2024-03-04 DIAGNOSIS — E118 Type 2 diabetes mellitus with unspecified complications: Secondary | ICD-10-CM

## 2024-03-04 DIAGNOSIS — Z79899 Other long term (current) drug therapy: Secondary | ICD-10-CM

## 2024-03-04 DIAGNOSIS — Z833 Family history of diabetes mellitus: Secondary | ICD-10-CM

## 2024-03-04 DIAGNOSIS — Z8249 Family history of ischemic heart disease and other diseases of the circulatory system: Secondary | ICD-10-CM

## 2024-03-04 DIAGNOSIS — I130 Hypertensive heart and chronic kidney disease with heart failure and stage 1 through stage 4 chronic kidney disease, or unspecified chronic kidney disease: Secondary | ICD-10-CM | POA: Diagnosis present

## 2024-03-04 DIAGNOSIS — I129 Hypertensive chronic kidney disease with stage 1 through stage 4 chronic kidney disease, or unspecified chronic kidney disease: Secondary | ICD-10-CM

## 2024-03-04 DIAGNOSIS — Z794 Long term (current) use of insulin: Secondary | ICD-10-CM

## 2024-03-04 DIAGNOSIS — E870 Hyperosmolality and hypernatremia: Secondary | ICD-10-CM | POA: Diagnosis present

## 2024-03-04 DIAGNOSIS — R109 Unspecified abdominal pain: Secondary | ICD-10-CM | POA: Diagnosis not present

## 2024-03-04 DIAGNOSIS — M4800 Spinal stenosis, site unspecified: Secondary | ICD-10-CM | POA: Diagnosis present

## 2024-03-04 DIAGNOSIS — I341 Nonrheumatic mitral (valve) prolapse: Secondary | ICD-10-CM | POA: Diagnosis present

## 2024-03-04 DIAGNOSIS — Z7985 Long-term (current) use of injectable non-insulin antidiabetic drugs: Secondary | ICD-10-CM

## 2024-03-04 DIAGNOSIS — M109 Gout, unspecified: Secondary | ICD-10-CM | POA: Diagnosis present

## 2024-03-04 DIAGNOSIS — Z87891 Personal history of nicotine dependence: Secondary | ICD-10-CM

## 2024-03-04 DIAGNOSIS — E11649 Type 2 diabetes mellitus with hypoglycemia without coma: Secondary | ICD-10-CM | POA: Diagnosis not present

## 2024-03-04 DIAGNOSIS — Z885 Allergy status to narcotic agent status: Secondary | ICD-10-CM

## 2024-03-04 LAB — BASIC METABOLIC PANEL WITH GFR
Anion gap: 10 (ref 5–15)
BUN: 25 mg/dL — ABNORMAL HIGH (ref 8–23)
CO2: 27 mmol/L (ref 22–32)
Calcium: 9.9 mg/dL (ref 8.9–10.3)
Chloride: 109 mmol/L (ref 98–111)
Creatinine, Ser: 1.5 mg/dL — ABNORMAL HIGH (ref 0.44–1.00)
GFR, Estimated: 38 mL/min — ABNORMAL LOW (ref >60)
Glucose, Bld: 122 mg/dL — ABNORMAL HIGH (ref 70–99)
Potassium: 3.8 mmol/L (ref 3.5–5.1)
Sodium: 146 mmol/L — ABNORMAL HIGH (ref 135–145)

## 2024-03-04 LAB — URINALYSIS, W/ REFLEX TO CULTURE (INFECTION SUSPECTED)
Bilirubin Urine: NEGATIVE
Glucose, UA: >=500 mg/dL — AB
Hgb urine dipstick: NEGATIVE
Ketones, ur: NEGATIVE mg/dL
Nitrite: NEGATIVE
Protein, ur: 100 mg/dL — AB
Specific Gravity, Urine: 1.014 (ref 1.005–1.030)
pH: 5 (ref 5.0–8.0)

## 2024-03-04 LAB — RESP PANEL BY RT-PCR (RSV, FLU A&B, COVID)  RVPGX2
Influenza A by PCR: NEGATIVE
Influenza B by PCR: NEGATIVE
Resp Syncytial Virus by PCR: NEGATIVE
SARS Coronavirus 2 by RT PCR: NEGATIVE

## 2024-03-04 LAB — CBC WITH DIFFERENTIAL/PLATELET
Abs Immature Granulocytes: 0.02 K/uL (ref 0.00–0.07)
Basophils Absolute: 0.1 K/uL (ref 0.0–0.1)
Basophils Relative: 1 %
Eosinophils Absolute: 0.1 K/uL (ref 0.0–0.5)
Eosinophils Relative: 2 %
HCT: 43.9 % (ref 36.0–46.0)
Hemoglobin: 13.7 g/dL (ref 12.0–15.0)
Immature Granulocytes: 0 %
Lymphocytes Relative: 21 %
Lymphs Abs: 1.7 K/uL (ref 0.7–4.0)
MCH: 25.9 pg — ABNORMAL LOW (ref 26.0–34.0)
MCHC: 31.2 g/dL (ref 30.0–36.0)
MCV: 83.1 fL (ref 80.0–100.0)
Monocytes Absolute: 0.8 K/uL (ref 0.1–1.0)
Monocytes Relative: 9 %
Neutro Abs: 5.6 K/uL (ref 1.7–7.7)
Neutrophils Relative %: 67 %
Platelets: 159 K/uL (ref 150–400)
RBC: 5.28 MIL/uL — ABNORMAL HIGH (ref 3.87–5.11)
RDW: 15.6 % — ABNORMAL HIGH (ref 11.5–15.5)
WBC: 8.3 K/uL (ref 4.0–10.5)
nRBC: 0 % (ref 0.0–0.2)

## 2024-03-04 LAB — GLUCOSE, CAPILLARY: Glucose-Capillary: 168 mg/dL — ABNORMAL HIGH (ref 70–99)

## 2024-03-04 LAB — CK: Total CK: 44 U/L (ref 38–234)

## 2024-03-04 MED ORDER — ASPIRIN 81 MG PO TBEC
81.0000 mg | DELAYED_RELEASE_TABLET | Freq: Every day | ORAL | Status: DC
  Administered 2024-03-05 – 2024-03-07 (×3): 81 mg via ORAL
  Filled 2024-03-04 (×3): qty 1

## 2024-03-04 MED ORDER — PANTOPRAZOLE SODIUM 40 MG PO TBEC
40.0000 mg | DELAYED_RELEASE_TABLET | Freq: Every day | ORAL | Status: DC
  Administered 2024-03-05 – 2024-03-07 (×3): 40 mg via ORAL
  Filled 2024-03-04 (×3): qty 1

## 2024-03-04 MED ORDER — SODIUM CHLORIDE 0.9 % IV SOLN
8.0000 mg/kg | Freq: Every day | INTRAVENOUS | Status: DC
  Administered 2024-03-04: 600 mg via INTRAVENOUS
  Filled 2024-03-04 (×2): qty 12

## 2024-03-04 MED ORDER — HYDROXYZINE HCL 25 MG PO TABS: 12.5000 mg | ORAL_TABLET | Freq: Three times a day (TID) | ORAL | Status: DC | PRN

## 2024-03-04 MED ORDER — DAPAGLIFLOZIN PROPANEDIOL 10 MG PO TABS
10.0000 mg | ORAL_TABLET | Freq: Every day | ORAL | Status: DC
  Administered 2024-03-05 – 2024-03-07 (×3): 10 mg via ORAL
  Filled 2024-03-04 (×3): qty 1

## 2024-03-04 MED ORDER — INSULIN GLARGINE-YFGN 100 UNIT/ML ~~LOC~~ SOLN
2.0000 [IU] | Freq: Every day | SUBCUTANEOUS | Status: DC
  Administered 2024-03-05: 2 [IU] via SUBCUTANEOUS
  Filled 2024-03-04 (×2): qty 0.02

## 2024-03-04 MED ORDER — ONDANSETRON HCL 4 MG/2ML IJ SOLN: 4.0000 mg | Freq: Four times a day (QID) | INTRAMUSCULAR | Status: DC | PRN

## 2024-03-04 MED ORDER — CELECOXIB 200 MG PO CAPS
200.0000 mg | ORAL_CAPSULE | Freq: Two times a day (BID) | ORAL | Status: DC
  Administered 2024-03-05 – 2024-03-07 (×5): 200 mg via ORAL
  Filled 2024-03-04 (×5): qty 1

## 2024-03-04 MED ORDER — INSULIN ASPART 100 UNIT/ML IJ SOLN
0.0000 [IU] | Freq: Three times a day (TID) | INTRAMUSCULAR | Status: DC
  Administered 2024-03-05: 3 [IU] via SUBCUTANEOUS
  Administered 2024-03-05: 2 [IU] via SUBCUTANEOUS
  Administered 2024-03-05: 3 [IU] via SUBCUTANEOUS
  Administered 2024-03-06 (×2): 5 [IU] via SUBCUTANEOUS
  Administered 2024-03-06 – 2024-03-07 (×3): 3 [IU] via SUBCUTANEOUS
  Filled 2024-03-04: qty 0.09

## 2024-03-04 MED ORDER — LEVOTHYROXINE SODIUM 150 MCG PO TABS
150.0000 ug | ORAL_TABLET | Freq: Every day | ORAL | Status: DC
  Administered 2024-03-05 – 2024-03-07 (×3): 150 ug via ORAL
  Filled 2024-03-04 (×3): qty 1

## 2024-03-04 MED ORDER — INSULIN DEGLUDEC 200 UNIT/ML ~~LOC~~ SOPN: 8.0000 [IU] | PEN_INJECTOR | Freq: Every day | SUBCUTANEOUS | Status: DC

## 2024-03-04 MED ORDER — SIMVASTATIN 20 MG PO TABS
20.0000 mg | ORAL_TABLET | Freq: Every evening | ORAL | Status: DC
Start: 2024-03-04 — End: 2024-03-05
  Administered 2024-03-04: 20 mg via ORAL
  Filled 2024-03-04: qty 1

## 2024-03-04 MED ORDER — ALBUTEROL SULFATE (2.5 MG/3ML) 0.083% IN NEBU
3.0000 mL | INHALATION_SOLUTION | RESPIRATORY_TRACT | Status: DC | PRN
Start: 2024-03-04 — End: 2024-03-07

## 2024-03-04 MED ORDER — FLUTICASONE PROPIONATE 50 MCG/ACT NA SUSP
2.0000 | Freq: Every day | NASAL | Status: DC
  Administered 2024-03-05 – 2024-03-07 (×3): 2 via NASAL
  Filled 2024-03-04: qty 16

## 2024-03-04 MED ORDER — INSULIN DEGLUDEC 200 UNIT/ML ~~LOC~~ SOPN: 2.0000 [IU] | PEN_INJECTOR | Freq: Every day | SUBCUTANEOUS | Status: DC

## 2024-03-04 MED ORDER — GABAPENTIN 400 MG PO CAPS
400.0000 mg | ORAL_CAPSULE | Freq: Two times a day (BID) | ORAL | Status: DC
  Administered 2024-03-04 – 2024-03-07 (×6): 400 mg via ORAL
  Filled 2024-03-04 (×6): qty 1

## 2024-03-04 MED ORDER — ALLOPURINOL 300 MG PO TABS
300.0000 mg | ORAL_TABLET | Freq: Two times a day (BID) | ORAL | Status: DC
  Administered 2024-03-04 – 2024-03-07 (×6): 300 mg via ORAL
  Filled 2024-03-04 (×6): qty 1

## 2024-03-04 MED ORDER — ENOXAPARIN SODIUM 40 MG/0.4ML IJ SOSY
40.0000 mg | PREFILLED_SYRINGE | INTRAMUSCULAR | Status: DC
  Administered 2024-03-04 – 2024-03-06 (×3): 40 mg via SUBCUTANEOUS
  Filled 2024-03-04 (×3): qty 0.4

## 2024-03-04 MED ORDER — ONDANSETRON HCL 4 MG PO TABS: 4.0000 mg | ORAL_TABLET | Freq: Four times a day (QID) | ORAL | Status: DC | PRN

## 2024-03-04 NOTE — ED Triage Notes (Addendum)
 Pt reports with positive blood cultures. Pt denies symptoms.

## 2024-03-04 NOTE — Consult Note (Incomplete)
 NAME: Amy [name]  DOB: 10/07/1960  MRN: [id number]  Date/Time: 03/04/2024 7:50 PM  REQUESTING PROVIDER: Jannet consult Subjective:  REASON FOR CONSULT: MRSA bacteremia ? Amy [name] is a 63 y.o. with a history of CHF, HTN, DM, legal blindness, CKD, DM neuropathy, HLD presented to the ED on 10/17 wuth nausea, dizziness and weakness and BP 74/40 as per EMS and low blood sugar, BC was 37- Vitals were normal in ED, WBC N, cr 1.90, CXR N She was treated fro hypoglycemia and as she was stable DC home on 10/18 On 10/19 BC came back positive for MRSA but they could not reach her  Today She is called back to the ED as MRSA is in one blood culture  03/04/24 14:02  BP 120/73  Temp 98.1 F (36.7 C)  Pulse Rate 68  Resp 16  SpO2 100 %    Latest Reference Range & Units 03/04/24 14:24  WBC 4.0 - 10.5 K/uL 8.3  Hemoglobin 12.0 - 15.0 g/dL 86.2  HCT 63.9 - 53.9 % 43.9  Platelets 150 - 400 K/uL 159  Creatinine 0.44 - 1.00 mg/dL 8.49 (H)  Patient states she lives with her son She is legally blind secondary to diabetes As she was doing well until until a few days ago She is dizzy She has had back pain mid back She complains of upper thigh pain And has had a few episodes of loose stools which is better now  Past Medical History:  Diagnosis Date   Arthritis    Asthma    Blind    Blindness and low vision    left eye glass eye,  legally blind in right eye   Diabetes mellitus    Diabetic neuropathy (HCC)    Hyperlipidemia    Hypertension    Hypothyroidism    Intractable nausea and vomiting 03/06/2018   Spinal stenosis     Past Surgical History:  Procedure Laterality Date   ABDOMINAL HYSTERECTOMY  1990   BREAST BIOPSY Left    BREAST BIOPSY Right    BREAST BIOPSY Right 08/19/2022   US  RT BREAST BX W LOC DEV 1ST LESION IMG BX SPEC US  GUIDE 08/19/2022 [facility]   CESAREAN SECTION  1987, 1990   x 2   CHOLECYSTECTOMY  2008   COLONOSCOPY WITH PROPOFOL  N/A 05/05/2023   Procedure:  COLONOSCOPY WITH PROPOFOL ;  Surgeon: Rollin Dover, MD;  Location: [facility];  Service: Gastroenterology;  Laterality: N/A;   ENUCLEATION Bilateral 09/15/1998   EYE SURGERY  2016   fitting artificial eye   POLYPECTOMY  05/05/2023   Procedure: POLYPECTOMY;  Surgeon: Rollin Dover, MD;  Location: [facility];  Service: Gastroenterology;;    Social History   Socioeconomic History   Marital status: Single    Spouse name: Not on file   Number of children: 2   Years of education: 12   Highest education level: Not on file  Occupational History   Occupation: N/A    Comment: disabled  Tobacco Use   Smoking status: Former    Current packs/day: 0.00    Types: Cigarettes    Quit date: 05/22/1992    Years since quitting: 31.8   Smokeless tobacco: Never  Vaping Use   Vaping status: Never Used  Substance and Sexual Activity   Alcohol use: No    Comment: quit 20 yrs ago   Drug use: No   Sexual activity: Not Currently  Other Topics Concern   Not on file  Social History  Narrative   Lives alone   caffeine drinks 2 cups of coffee a day, occasional soda     Right Handed   Social Drivers of Health   Financial Resource Strain: Medium Risk (10/02/2019)   Overall Financial Resource Strain (CARDIA)    Difficulty of Paying Living Expenses: Somewhat hard  Food Insecurity: No Food Insecurity (11/10/2023)   Hunger Vital Sign    Worried About Running Out of Food in the Last Year: Never true    Ran Out of Food in the Last Year: Never true  Transportation Needs: No Transportation Needs (11/10/2023)   PRAPARE - Administrator, Civil Service (Medical): No    Lack of Transportation (Non-Medical): No  Physical Activity: Not on file  Stress: Not on file  Social Connections: Unknown (03/09/2023)   [facility]   Social Network    Social Network: Not on file  Intimate Partner Violence: Not At Risk (11/10/2023)   Humiliation, Afraid, Rape, and Kick questionnaire    Fear of  Current or Ex-Partner: No    Emotionally Abused: No    Physically Abused: No    Sexually Abused: No    Family History  Problem Relation Age of Onset   Diabetes Sister    Glaucoma Sister    Hypertension Sister    Healthy Mother    Healthy Father    Stroke Brother    Heart attack Paternal Aunt    Breast cancer Neg Hx    Allergies  Allergen Reactions   Penicillins Hives and Swelling    Has patient had a PCN reaction causing immediate rash, facial/tongue/throat swelling, SOB or lightheadedness with hypotension: yes- face swelling Has patient had a PCN reaction causing severe rash involving mucus membranes or skin necrosis: no Has patient had a PCN reaction that required hospitalization unknown (childhood allergy) Has patient had a PCN reaction occurring within the last 10 years: no If all of the above answers are NO, then may proceed with Cephalosporin use.    Tramadol  Nausea And Vomiting    Intense nausea   Vicodin [Hydrocodone -Acetaminophen ] Nausea And Vomiting   Codeine Nausea Only    Other reaction(s): Unknown   Iodine-131 Hives   Iohexol Hives    Pt developed itching and hives along with nasal congestion; needs 13 hour premeds for future studies, Onset Date: [id number]    Lisinopril Cough    Other reaction(s): Unknown   Sulfa Antibiotics Nausea And Vomiting   I? Current Facility-Administered Medications  Medication Dose Route Frequency Provider Last Rate Last Admin   albuterol  (PROVENTIL ) (2.5 MG/3ML) 0.083% nebulizer solution 3 mL  3 mL Inhalation Q4H PRN Kc, Ramesh, MD       allopurinol  (ZYLOPRIM ) tablet 300 mg  300 mg Oral BID Kc, Ramesh, MD       [START ON 03/05/2024] aspirin  EC tablet 81 mg  81 mg Oral Daily Kc, Ramesh, MD       [START ON 03/05/2024] celecoxib (CELEBREX) capsule 200 mg  200 mg Oral BID Kc, Ramesh, MD       [START ON 03/05/2024] dapagliflozin  propanediol (FARXIGA ) tablet 10 mg  10 mg Oral Daily Kc, Ramesh, MD       DAPTOmycin (CUBICIN) 600 mg in  sodium chloride  0.9 % IVPB  8 mg/kg Intravenous Q1400 Fayette Bodily, MD   Stopped at 03/04/24 1843   enoxaparin  (LOVENOX ) injection 40 mg  40 mg Subcutaneous Q24H Kc, Mennie, MD       [START ON 03/05/2024] fluticasone  (  FLONASE ) 50 MCG/ACT nasal spray 2 spray  2 spray Each Nare Daily Kc, Ramesh, MD       gabapentin  (NEURONTIN ) capsule 400 mg  400 mg Oral BID Kc, Ramesh, MD       hydrOXYzine  (ATARAX ) tablet 12.5 mg  12.5 mg Oral Q8H PRN Kc, Ramesh, MD       [START ON 03/05/2024] insulin  aspart (novoLOG ) injection 0-9 Units  0-9 Units Subcutaneous TID WC Kc, Ramesh, MD       [START ON 03/05/2024] insulin  glargine-yfgn (SEMGLEE ) injection 2 Units  2 Units Subcutaneous Daily Ellington, Abby K, RPH       [START ON 03/05/2024] levothyroxine  (SYNTHROID ) tablet 150 mcg  150 mcg Oral Daily Kc, Ramesh, MD       ondansetron  (ZOFRAN ) tablet 4 mg  4 mg Oral Q6H PRN Kc, Ramesh, MD       Or   ondansetron  (ZOFRAN ) injection 4 mg  4 mg Intravenous Q6H PRN Kc, Ramesh, MD       [START ON 03/05/2024] pantoprazole  (PROTONIX ) EC tablet 40 mg  40 mg Oral Daily Kc, Ramesh, MD       simvastatin  (ZOCOR ) tablet 20 mg  20 mg Oral QPM Kc, Ramesh, MD       Current Outpatient Medications  Medication Sig Dispense Refill   albuterol  (PROVENTIL  HFA;VENTOLIN  HFA) 108 (90 BASE) MCG/ACT inhaler Inhale 2 puffs into the lungs every 4 (four) hours as needed for wheezing or shortness of breath. For short     allopurinol  (ZYLOPRIM ) 300 MG tablet Take 300 mg by mouth 2 (two) times daily.     aspirin  EC 81 MG tablet Take 81 mg by mouth daily.     blood glucose meter kit and supplies Dispense based on patient and insurance preference. Use up to four times daily as directed. (FOR ICD-10 E10.9, E11.9). 1 each 0   Blood Glucose Monitoring Suppl (PRODIGY AUTOCODE BLOOD GLUCOSE) w/Device KIT 1 Device by Does not apply route daily in the afternoon. 1 kit 0   celecoxib (CELEBREX) 200 MG capsule Take 200 mg by mouth 2 (two) times daily.      cephALEXin  (KEFLEX ) 500 MG capsule Take 1 capsule (500 mg total) by mouth 2 (two) times daily. 10 capsule 0   cetirizine (ZYRTEC) 10 MG tablet Take 10 mg by mouth at bedtime.     Continuous Blood Gluc Sensor (DEXCOM G7 SENSOR) MISC 1 Device by Does not apply route as directed. 9 each 3   dapagliflozin  propanediol (FARXIGA ) 10 MG TABS tablet Take 1 tablet (10 mg total) by mouth daily. 90 tablet 3   docusate sodium  (COLACE) 100 MG capsule Take 100 mg by mouth 2 (two) times daily.     Dulaglutide  (TRULICITY ) 3 MG/0.5ML SOAJ Inject 3 mg as directed once a week. (Patient taking differently: Inject 3 mg as directed once a week. On fridays) 6 mL 3   EMBRACE TALK GLUCOSE TEST test strip USE TO CHECK BLOOD SUGARS FOUR TIMES A DAY BEFORE MEALS AND AT BEDTIME DX E11.29 300 strip 3   esomeprazole  (NEXIUM ) 40 MG capsule Take 1 capsule (40 mg total) by mouth daily. 30 capsule 0   fluticasone  (FLONASE ) 50 MCG/ACT nasal spray Place 2 sprays into both nostrils daily.     gabapentin  (NEURONTIN ) 400 MG capsule Take 400 mg by mouth 2 (two) times daily.     glucose blood (ONETOUCH VERIO) test strip Check blood sugar 1-2 times daily 100 each 12   hydrOXYzine  (ATARAX /VISTARIL ) 25 MG  tablet Take 0.5 tablets (12.5 mg total) by mouth every 8 (eight) hours as needed for itching. 30 tablet 0   insulin  degludec (TRESIBA  FLEXTOUCH) 200 UNIT/ML FlexTouch Pen Inject 8 Units into the skin daily at 6 (six) AM. (Patient taking differently: Inject 8 Units into the skin at bedtime.) 15 mL 2   insulin  lispro (HUMALOG ) 100 UNIT/ML KwikPen MAX DAILY 30 UNITS (Patient taking differently: Inject 0-30 Units into the skin 3 (three) times daily. PER SLIDING SCALE MAX DAILY 30 UNITS) 30 mL 3   Insulin  Pen Needle 32G X 4 MM MISC 1 Device by Does not apply route in the morning, at noon, in the evening, and at bedtime. 400 each 3   levothyroxine  (SYNTHROID ) 150 MCG tablet Take 1 tablet (150 mcg total) by mouth daily. Needs appt for future refills  90 tablet 0   naproxen  (NAPROSYN ) 500 MG tablet Take 500 mg by mouth 2 (two) times daily with a meal.     olmesartan (BENICAR) 40 MG tablet Take 40 mg by mouth daily.     polyethylene glycol powder (MIRALAX ) 17 GM/SCOOP powder Take 17 g by mouth daily as needed for moderate constipation.     Prodigy Twist Top Lancets 28G MISC USE TO CHECK BLOOD SUGARS     simvastatin  (ZOCOR ) 20 MG tablet Take 1 tablet (20 mg total) by mouth every evening. 90 tablet 0     Abtx:  Anti-infectives (From admission, onward)    Start     Dose/Rate Route Frequency Ordered Stop   03/04/24 1630  DAPTOmycin (CUBICIN) 600 mg in sodium chloride  0.9 % IVPB        8 mg/kg  74.8 kg 124 mL/hr over 30 Minutes Intravenous Daily 03/04/24 1629         REVIEW OF SYSTEMS:  Const: negative fever, negative chills, negative weight loss Eyes: negative diplopia or visual changes, negative eye pain ENT: negative coryza, negative sore throat Resp: negative cough, hemoptysis, has dyspnea Cards: negative for chest pain, palpitations, lower extremity edema GU: negative for frequency, dysuria and hematuria GI: Lower abdominal pain, diarrhea Skin: negative for rash and pruritus Heme: negative for easy bruising and gum/nose bleeding MS: Back pain and weakness Neurolo: Dizziness Psych: negative for feelings of anxiety, depression  Endocrine:  diabetes Allergy/Immunology- negative for any medication or food allergies ? Pertinent Positives include : Objective:  VITALS:  BP (!) 157/61 (BP Location: Right Arm)   Pulse (!) 59   Temp 97.7 F (36.5 C) (Oral)   Resp 16   Wt 74.8 kg   SpO2 100%   BMI 33.33 kg/m  LDA Foley Central line Other drainage tubes PHYSICAL EXAM:  General: Alert, cooperative, no distress, appears stated age.  Head: Normocephalic, without obvious abnormality, atraumatic. Eyes: left eye shut Rt eye some erythema  ENT Nares normal. No drainage or sinus tenderness. Lips, mucosa, and tongue normal. No  Thrush Neck: Supple, symmetrical, no adenopathy, thyroid : non tender no carotid bruit and no JVD. Back: No CVA tenderness. Lungs: Clear to auscultation bilaterally. No Wheezing or Rhonchi. No rales. Heart: Regular rate and rhythm, no murmur, rub or gallop. Abdomen: Soft, lower abdominal tenderness Extremities: tenderness thighs Thoracic spine area tender to pressure Skin: No rashes or lesions. Or bruising Lymph: Cervical, supraclavicular normal. Neurologic: Grossly non-focal Pertinent Labs Lab Results CBC    Component Value Date/Time   WBC 8.3 03/04/2024 1424   RBC 5.28 (H) 03/04/2024 1424   HGB 13.7 03/04/2024 1424   HGB 12.5 07/26/2018  1058   HCT 43.9 03/04/2024 1424   HCT 39.4 07/26/2018 1058   PLT 159 03/04/2024 1424   PLT 169 07/26/2018 1058   MCV 83.1 03/04/2024 1424   MCV 80 07/26/2018 1058   MCH 25.9 (L) 03/04/2024 1424   MCHC 31.2 03/04/2024 1424   RDW 15.6 (H) 03/04/2024 1424   RDW 14.7 07/26/2018 1058   LYMPHSABS 1.7 03/04/2024 1424   MONOABS 0.8 03/04/2024 1424   EOSABS 0.1 03/04/2024 1424   BASOSABS 0.1 03/04/2024 1424       Latest Ref Rng & Units 03/04/2024    2:24 PM 03/01/2024    8:49 PM 03/01/2024    8:45 PM  CMP  Glucose 70 - 99 mg/dL 877  32  31   BUN 8 - 23 mg/dL 25  20  23    Creatinine 0.44 - 1.00 mg/dL 8.49  8.39  8.09   Sodium 135 - 145 mmol/L 146  147  148   Potassium 3.5 - 5.1 mmol/L 3.8  3.7  3.5   Chloride 98 - 111 mmol/L 109  112  109   CO2 22 - 32 mmol/L 27  25    Calcium  8.9 - 10.3 mg/dL 9.9  9.5    Total Protein 6.5 - 8.1 g/dL  5.8    Total Bilirubin 0.0 - 1.2 mg/dL  0.3    Alkaline Phos 38 - 126 U/L  80    AST 15 - 41 U/L  27    ALT 0 - 44 U/L  8        Microbiology: Recent Results (from the past 240 hours)  Culture, blood (routine x 2)     Status: None (Preliminary result)   Collection Time: 03/01/24  9:15 PM   Specimen: BLOOD  Result Value Ref Range Status   Specimen Description   Final    BLOOD SITE NOT  SPECIFIED Performed at [facility], [address] [place], [place] [address]    Special Requests   Final    BOTTLES DRAWN AEROBIC AND ANAEROBIC Blood Culture results may not be optimal due to an inadequate volume of blood received in culture bottles Performed at [facility], [address], [place], [place] [address]    Culture   Final    NO GROWTH 3 DAYS Performed at [facility], [address], [place], [place] [address]    Report Status PENDING  Incomplete  Resp panel by RT-PCR (RSV, Flu A&B, Covid) Anterior Nasal Swab     Status: None   Collection Time: 03/01/24 10:57 PM   Specimen: Anterior Nasal Swab  Result Value Ref Range Status   SARS Coronavirus 2 by RT PCR NEGATIVE NEGATIVE Final    Comment: (NOTE) SARS-CoV-2 target nucleic acids are NOT DETECTED.  The SARS-CoV-2 RNA is generally detectable in upper respiratory specimens during the acute phase of infection. The lowest concentration of SARS-CoV-2 viral copies this assay can detect is 138 copies/mL. A negative result does not preclude SARS-Cov-2 infection and should not be used as the sole basis for treatment or other patient management decisions. A negative result may occur with  improper specimen collection/handling, submission of specimen other than nasopharyngeal swab, presence of viral mutation(s) within the areas targeted by this assay, and inadequate number of viral copies(<138 copies/mL). A negative result must be combined with clinical observations, patient history, and epidemiological information. The expected result is Negative.  Fact Sheet for Patients:  BloggerCourse.com  Fact Sheet for  Healthcare Providers:  SeriousBroker.it  This test is no t yet approved or cleared by the [place]  FDA and  has been authorized for detection and/or diagnosis of SARS-CoV-2 by FDA under an Emergency Use Authorization  (EUA). This EUA will remain  in effect (meaning this test can be used) for the duration of the COVID-19 declaration under Section 564(b)(1) of the Act, 21 U.S.C.section 360bbb-3(b)(1), unless the authorization is terminated  or revoked sooner.       Influenza A by PCR NEGATIVE NEGATIVE Final   Influenza B by PCR NEGATIVE NEGATIVE Final    Comment: (NOTE) The Xpert Xpress SARS-CoV-2/FLU/RSV plus assay is intended as an aid in the diagnosis of influenza from Nasopharyngeal swab specimens and should not be used as a sole basis for treatment. Nasal washings and aspirates are unacceptable for Xpert Xpress SARS-CoV-2/FLU/RSV testing.  Fact Sheet for Patients: BloggerCourse.com  Fact Sheet for Healthcare Providers: SeriousBroker.it  This test is not yet approved or cleared by the [place]  FDA and has been authorized for detection and/or diagnosis of SARS-CoV-2 by FDA under an Emergency Use Authorization (EUA). This EUA will remain in effect (meaning this test can be used) for the duration of the COVID-19 declaration under Section 564(b)(1) of the Act, 21 U.S.C. section 360bbb-3(b)(1), unless the authorization is terminated or revoked.     Resp Syncytial Virus by PCR NEGATIVE NEGATIVE Final    Comment: (NOTE) Fact Sheet for Patients: BloggerCourse.com  Fact Sheet for Healthcare Providers: SeriousBroker.it  This test is not yet approved or cleared by the [place]  FDA and has been authorized for detection and/or diagnosis of SARS-CoV-2 by FDA under an Emergency Use Authorization (EUA). This EUA will remain in effect (meaning this test can be used) for the duration of the COVID-19 declaration under Section 564(b)(1) of the Act, 21 U.S.C. section 360bbb-3(b)(1), unless the authorization is terminated or revoked.  Performed at [facility], [address], [place], [place] [address]   Culture, blood (routine x 2)     Status: Abnormal (Preliminary result)   Collection Time: 03/02/24  8:04 AM   Specimen: BLOOD RIGHT HAND  Result Value Ref Range Status   Specimen Description   Final    BLOOD RIGHT HAND BOTTLES DRAWN AEROBIC AND ANAEROBIC Performed at [facility], [address], [place], [place] [address]    Special Requests   Final    Blood Culture results may not be optimal due to an inadequate volume of blood received in culture bottles Performed at [facility], [address], [place], [place] [address]    Culture  Setup Time   Final    GRAM POSITIVE COCCI IN CLUSTERS IN BOTH AEROBIC AND ANAEROBIC BOTTLES HOLLY CHARGE NURSE 03/03/2024 @ 0144 BY DD    Culture (A)  Final    STAPHYLOCOCCUS AUREUS STAPHYLOCOCCUS EPIDERMIDIS SUSCEPTIBILITIES TO FOLLOW THE SIGNIFICANCE OF ISOLATING THIS ORGANISM FROM A SINGLE SET OF BLOOD CULTURES WHEN MULTIPLE SETS ARE DRAWN IS UNCERTAIN. PLEASE NOTIFY THE MICROBIOLOGY DEPARTMENT WITHIN ONE WEEK IF SPECIATION AND SENSITIVITIES ARE REQUIRED. Performed at [facility], [address], [place], [place] [address]    Report Status PENDING  Incomplete  Blood Culture ID Panel (Reflexed)     Status: Abnormal   Collection Time: 03/02/24  8:04 AM  Result Value Ref Range Status   Enterococcus faecalis NOT DETECTED NOT DETECTED Final   Enterococcus Faecium NOT DETECTED NOT DETECTED Final   Listeria monocytogenes NOT  DETECTED NOT DETECTED Final   Staphylococcus species DETECTED (A) NOT DETECTED Final    Comment: CRITICAL RESULT CALLED TO, READ BACK BY AND VERIFIED WITH: HOLLY CHARGE NURSE 03/03/2024 @ 0144 BY DD    Staphylococcus aureus (BCID) DETECTED (A) NOT DETECTED Final    Comment: Methicillin (oxacillin)-resistant Staphylococcus aureus (MRSA). MRSA is predictably resistant to beta-lactam antibiotics (except ceftaroline). Preferred therapy is vancomycin  unless  clinically contraindicated. Patient requires contact precautions if  hospitalized. CRITICAL RESULT CALLED TO, READ BACK BY AND VERIFIED WITH: HOLLY CHARGE NURSE 03/03/2024 @ 0144 BY DD    Staphylococcus epidermidis DETECTED (A) NOT DETECTED Final    Comment: CRITICAL RESULT CALLED TO, READ BACK BY AND VERIFIED WITH: HOLLY CHARGE NURSE 03/03/2024 @ 0144 BY DD    Staphylococcus lugdunensis NOT DETECTED NOT DETECTED Final   Streptococcus species NOT DETECTED NOT DETECTED Final   Streptococcus agalactiae NOT DETECTED NOT DETECTED Final   Streptococcus pneumoniae NOT DETECTED NOT DETECTED Final   Streptococcus pyogenes NOT DETECTED NOT DETECTED Final   A.calcoaceticus-baumannii NOT DETECTED NOT DETECTED Final   Bacteroides fragilis NOT DETECTED NOT DETECTED Final   Enterobacterales NOT DETECTED NOT DETECTED Final   Enterobacter cloacae complex NOT DETECTED NOT DETECTED Final   Escherichia coli NOT DETECTED NOT DETECTED Final   Klebsiella aerogenes NOT DETECTED NOT DETECTED Final   Klebsiella oxytoca NOT DETECTED NOT DETECTED Final   Klebsiella pneumoniae NOT DETECTED NOT DETECTED Final   Proteus species NOT DETECTED NOT DETECTED Final   Salmonella species NOT DETECTED NOT DETECTED Final   Serratia marcescens NOT DETECTED NOT DETECTED Final   Haemophilus influenzae NOT DETECTED NOT DETECTED Final   Neisseria meningitidis NOT DETECTED NOT DETECTED Final   Pseudomonas aeruginosa NOT DETECTED NOT DETECTED Final   Stenotrophomonas maltophilia NOT DETECTED NOT DETECTED Final   Candida albicans NOT DETECTED NOT DETECTED Final   Candida auris NOT DETECTED NOT DETECTED Final   Candida glabrata NOT DETECTED NOT DETECTED Final   Candida krusei NOT DETECTED NOT DETECTED Final   Candida parapsilosis NOT DETECTED NOT DETECTED Final   Candida tropicalis NOT DETECTED NOT DETECTED Final   Cryptococcus neoformans/gattii NOT DETECTED NOT DETECTED Final   Methicillin resistance mecA/C DETECTED (A) NOT  DETECTED Final    Comment: CRITICAL RESULT CALLED TO, READ BACK BY AND VERIFIED WITH: HOLLY CHARGE NURSE 03/03/2024 @ 0144 BY DD    Meth resistant mecA/C and MREJ DETECTED (A) NOT DETECTED Final    Comment: CRITICAL RESULT CALLED TO, READ BACK BY AND VERIFIED WITH: HOLLY CHARGE NURSE 03/03/2024 @ 0144 BY DD Performed at [facility], [address], [place], [place] [address]   Resp panel by RT-PCR (RSV, Flu A&B, Covid) Anterior Nasal Swab     Status: None   Collection Time: 03/04/24  4:52 PM   Specimen: Anterior Nasal Swab  Result Value Ref Range Status   SARS Coronavirus 2 by RT PCR NEGATIVE NEGATIVE Final    Comment: (NOTE) SARS-CoV-2 target nucleic acids are NOT DETECTED.  The SARS-CoV-2 RNA is generally detectable in upper respiratory specimens during the acute phase of infection. The lowest concentration of SARS-CoV-2 viral copies this assay can detect is 138 copies/mL. A negative result does not preclude SARS-Cov-2 infection and should not be used as the sole basis for treatment or other patient management decisions. A negative result may occur with  improper specimen collection/handling, submission of specimen other than nasopharyngeal swab, presence of viral mutation(s) within the areas targeted by this assay, and inadequate  number of viral copies(<138 copies/mL). A negative result must be combined with clinical observations, patient history, and epidemiological information. The expected result is Negative.  Fact Sheet for Patients:  BloggerCourse.com  Fact Sheet for Healthcare Providers:  SeriousBroker.it  This test is no t yet approved or cleared by the [place]  FDA and  has been authorized for detection and/or diagnosis of SARS-CoV-2 by FDA under an Emergency Use Authorization (EUA). This EUA will remain  in effect (meaning this test can be used) for the duration of the COVID-19 declaration under Section  564(b)(1) of the Act, 21 U.S.C.section 360bbb-3(b)(1), unless the authorization is terminated  or revoked sooner.       Influenza A by PCR NEGATIVE NEGATIVE Final   Influenza B by PCR NEGATIVE NEGATIVE Final    Comment: (NOTE) The Xpert Xpress SARS-CoV-2/FLU/RSV plus assay is intended as an aid in the diagnosis of influenza from Nasopharyngeal swab specimens and should not be used as a sole basis for treatment. Nasal washings and aspirates are unacceptable for Xpert Xpress SARS-CoV-2/FLU/RSV testing.  Fact Sheet for Patients: BloggerCourse.com  Fact Sheet for Healthcare Providers: SeriousBroker.it  This test is not yet approved or cleared by the [place]  FDA and has been authorized for detection and/or diagnosis of SARS-CoV-2 by FDA under an Emergency Use Authorization (EUA). This EUA will remain in effect (meaning this test can be used) for the duration of the COVID-19 declaration under Section 564(b)(1) of the Act, 21 U.S.C. section 360bbb-3(b)(1), unless the authorization is terminated or revoked.     Resp Syncytial Virus by PCR NEGATIVE NEGATIVE Final    Comment: (NOTE) Fact Sheet for Patients: BloggerCourse.com  Fact Sheet for Healthcare Providers: SeriousBroker.it  This test is not yet approved or cleared by the [place]  FDA and has been authorized for detection and/or diagnosis of SARS-CoV-2 by FDA under an Emergency Use Authorization (EUA). This EUA will remain in effect (meaning this test can be used) for the duration of the COVID-19 declaration under Section 564(b)(1) of the Act, 21 U.S.C. section 360bbb-3(b)(1), unless the authorization is terminated or revoked.  Performed at [facility], [address], [place], [place] [address]     Lines and Device Date on insertion # of days DC  Central line     Foley     ETT        IMAGING RESULTS: CXR no infiltrate I have personally reviewed the films ? Impression/Recommendation Presenting with dizziness, hypotension, low blood sugar   MRSA bacteremia Source is unclear She has back pain which is worse than before in the mid back She also complains of upper thigh pain and lower abdominal pain She needs imaging She will need MRI of the thoracic and lumbar spine And CT abdomen and pelvis  she is currently started on daptomycin Will get 2 d echo  CKD  Hypertension  DM - management as per primary team Recent hypoglycemia  Legally blind ? This consult involved complex antimicrobial management? I have personally spent  -75--minutes involved in face-to-face and non-face-to-face activities for this patient on the day of the visit. Professional time spent includes the following activities: Preparing to see the patient (review of tests), Obtaining and/or reviewing separately obtained history (admission/discharge record), Performing a medically appropriate examination and/or evaluation , Ordering medications/tests/procedures, referring and communicating with other health care professionals, Documenting clinical information in the EMR, Independently interpreting results (not separately reported), Communicating results to the patient/, Counseling and educating the patient/fand Care coordination (not separately  reported).    ________________________________________________  Note:  This document was prepared using Conservation officer, historic buildings and may include unintentional dictation errors.

## 2024-03-04 NOTE — Progress Notes (Signed)
   03/04/24 2321  BiPAP/CPAP/SIPAP  $ Non-Invasive Home Ventilator  Initial  $ Face Mask Medium Yes  BiPAP/CPAP/SIPAP Pt Type Adult  BiPAP/CPAP/SIPAP DREAMSTATIOND  Mask Type Full face mask  Dentures removed? Not applicable  Mask Size Medium  Patient Home Machine No  Patient Home Mask No  Patient Home Tubing No  Auto Titrate Yes  Minimum cmH2O 5 cmH2O  Maximum cmH2O 20 cmH2O  CPAP/SIPAP surface wiped down Yes  Device Plugged into RED Power Outlet Yes  BiPAP/CPAP /SiPAP Vitals  Bilateral Breath Sounds Clear;Diminished

## 2024-03-04 NOTE — ED Provider Notes (Signed)
 [facility] AT [facility] Provider Note   CSN: [id number] Arrival date & time: 03/04/24  1358     History {Add pertinent medical, surgical, social history, OB history to HPI:1} Chief Complaint  Patient presents with   Blood Infection    Amy [name] is a 63 y.o. female with diabetes, chronic kidney disease, blindness, CHF, hyperlipidemia  who presents with callback for positive blood cultures. Pt reports with positive blood cultures. Pt endorses some burning with urination and mild SOB with increased productive cough. Denies f/c, abdominal pain, nausea/vomiting/diarrhea. She is vitally stable. Blood cultures grew MRSA as well as staph epi. Patient reports feeling well with no sxs. She presented to ED on 03/01/24 for lightheadedness and low blood sugar, found to be hypotensive to 70s which responded to fluids.  Accompanied by her sister.   Past Medical History:  Diagnosis Date   Arthritis    Asthma    Blind    Blindness and low vision    left eye glass eye,  legally blind in right eye   Diabetes mellitus    Diabetic neuropathy (HCC)    Hyperlipidemia    Hypertension    Hypothyroidism    Intractable nausea and vomiting 03/06/2018   Spinal stenosis        Home Medications Prior to Admission medications   Medication Sig Start Date End Date Taking? Authorizing Provider  albuterol  (PROVENTIL  HFA;VENTOLIN  HFA) 108 (90 BASE) MCG/ACT inhaler Inhale 2 puffs into the lungs every 4 (four) hours as needed for wheezing or shortness of breath. For short    [provider]  allopurinol  (ZYLOPRIM ) 300 MG tablet Take 300 mg by mouth 2 (two) times daily. 11/23/20   [provider]  aspirin  EC 81 MG tablet Take 81 mg by mouth daily.    [provider]  blood glucose meter kit and supplies Dispense based on patient and insurance preference. Use up to four times daily as directed. (FOR ICD-10 E10.9, E11.9). 08/06/20   Levora Reyes SAUNDERS,  MD  Blood Glucose Monitoring Suppl (PRODIGY AUTOCODE BLOOD GLUCOSE) w/Device KIT 1 Device by Does not apply route daily in the afternoon. 09/21/23   Shamleffer, Ibtehal Jaralla, MD  celecoxib (CELEBREX) 200 MG capsule Take 200 mg by mouth 2 (two) times daily. 02/14/19   [provider]  cephALEXin  (KEFLEX ) 500 MG capsule Take 1 capsule (500 mg total) by mouth 2 (two) times daily. 03/02/24   Patsey Lot, MD  cetirizine (ZYRTEC) 10 MG tablet Take 10 mg by mouth at bedtime.    [provider]  Continuous Blood Gluc Sensor (DEXCOM G7 SENSOR) MISC 1 Device by Does not apply route as directed. 10/19/21   Shamleffer, Ibtehal Jaralla, MD  dapagliflozin  propanediol (FARXIGA ) 10 MG TABS tablet Take 1 tablet (10 mg total) by mouth daily. 09/21/23   Shamleffer, Ibtehal Jaralla, MD  docusate sodium  (COLACE) 100 MG capsule Take 100 mg by mouth 2 (two) times daily. 10/26/22   [provider]  Dulaglutide  (TRULICITY ) 3 MG/0.5ML SOAJ Inject 3 mg as directed once a week. Patient taking differently: Inject 3 mg as directed once a week. On fridays 09/21/23   Shamleffer, Ibtehal Jaralla, MD  EMBRACE TALK GLUCOSE TEST test strip USE TO CHECK BLOOD SUGARS FOUR TIMES A DAY BEFORE MEALS AND AT BEDTIME DX E11.29 07/29/20   Levora Reyes SAUNDERS, MD  esomeprazole  (NEXIUM ) 40 MG capsule Take 1 capsule (40 mg total) by mouth daily. 07/10/20   Bensimhon, Toribio SAUNDERS,  MD  fluticasone  (FLONASE ) 50 MCG/ACT nasal spray Place 2 sprays into both nostrils daily. 12/16/22   [provider]  gabapentin  (NEURONTIN ) 400 MG capsule Take 400 mg by mouth 2 (two) times daily. 08/10/21   [provider]  glucose blood (ONETOUCH VERIO) test strip Check blood sugar 1-2 times daily 09/21/23   Shamleffer, Donell Cardinal, MD  hydrOXYzine  (ATARAX /VISTARIL ) 25 MG tablet Take 0.5 tablets (12.5 mg total) by mouth every 8 (eight) hours as needed for itching. 12/05/20   Christopher Savannah, PA-C  insulin  degludec (TRESIBA  FLEXTOUCH) 200  UNIT/ML FlexTouch Pen Inject 8 Units into the skin daily at 6 (six) AM. Patient taking differently: Inject 8 Units into the skin at bedtime. 03/23/23   Shamleffer, Ibtehal Jaralla, MD  insulin  lispro (HUMALOG ) 100 UNIT/ML KwikPen MAX DAILY 30 UNITS Patient taking differently: Inject 0-30 Units into the skin 3 (three) times daily. PER SLIDING SCALE MAX DAILY 30 UNITS 09/21/23   Shamleffer, Donell Cardinal, MD  Insulin  Pen Needle 32G X 4 MM MISC 1 Device by Does not apply route in the morning, at noon, in the evening, and at bedtime. 09/21/23   Shamleffer, Ibtehal Jaralla, MD  levothyroxine  (SYNTHROID ) 150 MCG tablet Take 1 tablet (150 mcg total) by mouth daily. Needs appt for future refills 08/06/20   Levora Reyes SAUNDERS, MD  naproxen  (NAPROSYN ) 500 MG tablet Take 500 mg by mouth 2 (two) times daily with a meal.    [provider]  olmesartan (BENICAR) 40 MG tablet Take 40 mg by mouth daily. 02/01/23   [provider]  polyethylene glycol powder (MIRALAX ) 17 GM/SCOOP powder Take 17 g by mouth daily as needed for moderate constipation. 08/03/23   [provider]  Prodigy Twist Top Lancets 28G MISC USE TO CHECK BLOOD SUGARS 10/02/20   [provider]  simvastatin  (ZOCOR ) 20 MG tablet Take 1 tablet (20 mg total) by mouth every evening. 08/06/20   Levora Reyes SAUNDERS, MD      Allergies    Penicillins, Tramadol , Vicodin [hydrocodone -acetaminophen ], Codeine, Iodine-131, Iohexol, Lisinopril, and Sulfa antibiotics    Review of Systems   Review of Systems A 10 point review of systems was performed and is negative unless otherwise reported in HPI.  Physical Exam Updated Vital Signs BP 120/73 (BP Location: Left Arm)   Pulse 68   Temp 98.1 F (36.7 C) (Oral)   Resp 16   Wt 74.8 kg   SpO2 100%   BMI 33.33 kg/m  Physical Exam General: Normal appearing female, lying in bed.  HEENT: PERRLA, Sclera anicteric, MMM, trachea midline.  Cardiology: RRR, no murmurs/rubs/gallops. BL  radial and DP pulses equal bilaterally.  Resp: Normal respiratory rate and effort. CTAB, no wheezes, rhonchi, crackles.  Abd: Soft, non-tender, non-distended. No rebound tenderness or guarding.  GU: Deferred. MSK: No peripheral edema or signs of trauma. Extremities without deformity or TTP. No cyanosis or clubbing. Skin: warm, dry. No rashes or lesions. Back: No CVA tenderness Neuro: A&Ox4, CNs II-XII grossly intact. MAEs. Sensation grossly intact.  Psych: Normal mood and affect.   ED Results / Procedures / Treatments   Labs (all labs ordered are listed, but only abnormal results are displayed) Labs Reviewed  CBC WITH DIFFERENTIAL/PLATELET - Abnormal; Notable for the following components:      Result Value   RBC 5.28 (*)    MCH 25.9 (*)    RDW 15.6 (*)    All other components within normal limits  BASIC METABOLIC PANEL WITH GFR -  Abnormal; Notable for the following components:   Sodium 146 (*)    Glucose, Bld 122 (*)    BUN 25 (*)    Creatinine, Ser 1.50 (*)    GFR, Estimated 38 (*)    All other components within normal limits  CULTURE, BLOOD (ROUTINE X 2)  CULTURE, BLOOD (ROUTINE X 2)  RESP PANEL BY RT-PCR (RSV, FLU A&B, COVID)  [id number]  URINALYSIS, W/ REFLEX TO CULTURE (INFECTION SUSPECTED)    EKG None  Radiology No results found.  Procedures Procedures  {Document cardiac monitor, telemetry assessment procedure when appropriate:1}  Medications Ordered in ED Medications - No data to display  ED Course/ Medical Decision Making/ A&P                          Medical Decision Making Amount and/or Complexity of Data Reviewed Labs: ordered. Decision-making details documented in ED Course. Radiology: ordered.    This patient presents to the ED for concern of +blood cultures, this involves an extensive number of treatment options, and is a complaint that carries with it a high risk of complications and morbidity.  I considered the following differential and admission  for this acute, potentially life threatening condition. Well-appearing.  MDM:    With positive blood cultures  ***CXR w/ no e/o PNA. UA with no e/o UTI. No leukocytosis or fever. No vitals sign abnormalities. Very low c/f sepsis in this well-appearing, non-toxic appearing patient. Repeat blood cultures drawn.    Clinical Course as of 03/04/24 1622  Mon Mar 04, 2024  1552 Sodium(!): 146 Similar to prior mild hypernatremia [HN]  1553 Creatinine(!): 1.50 Similar to prior [HN]  1553 Glucose(!): 122 Reassuring glucose [HN]  1553 WBC: 8.3 No leukocytosis  [HN]  1553 Blood culture (routine x 2) Repeat blood cultures drawn [HN]    Clinical Course User Index [HN] Franklyn Sid SAILOR, MD    Labs: I Ordered, and personally interpreted labs.  The pertinent results include:  those listed above  Imaging Studies ordered: I ordered imaging studies including CXR I independently visualized and interpreted imaging. I agree with the radiologist interpretation  Additional history obtained from chart review, sister at bedside.    Reevaluation: After the interventions noted above, I reevaluated the patient and found that they have :{resolved/improved/worsened:23923::improved}  Social Determinants of Health: Lives independently  Disposition:  ***  Co morbidities that complicate the patient evaluation  Past Medical History:  Diagnosis Date   Arthritis    Asthma    Blind    Blindness and low vision    left eye glass eye,  legally blind in right eye   Diabetes mellitus    Diabetic neuropathy (HCC)    Hyperlipidemia    Hypertension    Hypothyroidism    Intractable nausea and vomiting 03/06/2018   Spinal stenosis      Medicines No orders of the defined types were placed in this encounter.   I have reviewed the patients home medicines and have made adjustments as needed  Problem List / ED Course: Problem List Items Addressed This Visit   None        {Document critical  care time when appropriate:1} {Document review of labs and clinical decision tools ie heart score, Chads2Vasc2 etc:1}  {Document your independent review of radiology images, and any outside records:1} {Document your discussion with family members, caretakers, and with consultants:1} {Document social determinants of health affecting pt's care:1} {Document your decision making why or  why not admission, treatments were needed:1}  This note was created using dictation software, which may contain spelling or grammatical errors.

## 2024-03-04 NOTE — Hospital Course (Addendum)
 Amy [name] is a 63 y.o. female with PMH of chronic diastolic CHF, HTN, T2DM with legal blindness, sleep apnea CKD stage IIIa, presented to the ED positive blood culture.  Patient was initially seen in the ED 10/17 due to nausea dizziness weakness hypotension, low blood sugar-after initial workup in the ED including normal lactic acid chest x-ray discharged home after iv fluids, and  blood culture taht grew MRSA as well as staph epidermidis attempts were made to call her and finally she presented to the EDShe has been complaining of mild shortness of breath, increased productive cough but no abdominal pain fever nausea vomiting diarrhea She stays cold all the time. She has right thigh pain. Patient otherwise denies any nausea, vomiting, chest pain, shortness of breath, fever, chills, headache, focal weakness, numbness tingling, speech difficulties , running nose. In the ED: VSS, labs with creatinine 1.5 baseline hyponatremia 146 normal CBC.  Normal CK COVID RSV flu pending UA pending blood culture ordered and admission was requested Daptomycin was started after discussion with ID  Subjective: Seen and examined Some thigh pain no other new complaint Overnight patient hemodynamically stable afebrile used CPAP at bedtime  Assessment and plan:  MRSA bacteremia: Source unclear, presented w/ hypotension, hypoglycemia and feeling unwell few days ago which improved q/ IV fluid hydration- discharged after blood culture that turned out to be positive with staph epi and MRSA.  Readmitted for bacteremia ID following, continue daptomycin follow-up echocardiogram.  Recheck blood culture sent 8/20.  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   Recent Labs  Lab 03/02/24 0102 03/02/24 0347 03/02/24 0758 03/04/24 2153 03/05/24 0733  GLUCAP 125* 195* 270* 168* 205*    CKD stage IIIa Hypernatremia: Electrolytes stable at baseline.  Cont to monitor  Hypertension: BP stable-cont to hold losartan due to recent hypotension  Hypothyroidism: Continue Synthroid .  GERD: Continue PPI  HLD Statin.  OSA: continue CPAP bedtime  Gout: Continue allopurinol .  Legal blindness Son lives with here. She manages her own insulin  by hearing the  click  Chronic diastolic heart failure: Appears euvolemic.   Class I Obesity w/ Body mass index is 33.33 kg/m.: Will benefit with PCP follow-up, weight loss,healthy lifestyle  DVT prophylaxis: enoxaparin  (LOVENOX ) injection 40 mg Start: 03/04/24 2200 Code Status:   Code Status: Full Code  Level of care: Telemetry  Dispo: The patient is from: home            Anticipated disposition: TBD Objective: Vitals last 24 hrs: Vitals:   03/04/24 2238 03/05/24 0118 03/05/24 0434 03/05/24 0943  BP:  129/84 114/67 92/64  Pulse:  73 71 79  Resp:  17 19 16   Temp:   (!) 97.4 F (36.3 C) 98.1 F (36.7 C)  TempSrc:   Oral   SpO2:  100% 100% 100%  Weight:      Height: 4' 11 (1.499 m)       Physical Examination: General exam: aaox3 HEENT:Oral mucosa moist, Ear/Nose WNL grossly Respiratory system: Bilaterally clear BS,no use of accessory muscle Cardiovascular system: S1 & S2 +, No JVD. Gastrointestinal system: Abdomen soft,NT,ND, BS+ Nervous System: Alert, awake, moving all extremities,and following commands. Extremities: extremities warm, leg edema neg  Skin: Warm, no rashes MSK: Normal muscle bulk,tone, power   Medications reviewed:  Scheduled Meds:  allopurinol   300 mg Oral BID   aspirin  EC  81 mg Oral Daily   celecoxib  200 mg Oral BID   dapagliflozin  propanediol  10 mg Oral Daily   enoxaparin  (LOVENOX ) injection  40 mg Subcutaneous  Q24H   fluticasone   2 spray Each Nare Daily   gabapentin   400 mg Oral BID   insulin  aspart  0-9 Units Subcutaneous TID WC   insulin  glargine-yfgn  2 Units Subcutaneous Daily   levothyroxine   150 mcg Oral Daily   pantoprazole   40 mg Oral Daily   simvastatin   20 mg Oral QPM   Continuous Infusions:  DAPTOmycin 500 mg (03/05/24 0835)   Diet: Diet Order             Diet Carb Modified Fluid consistency: Thin; Room service appropriate? Yes  Diet effective now

## 2024-03-04 NOTE — Progress Notes (Signed)
 ID Pharmacist Note   Spoke with patient and informed her of positive blood cultures and instructed her to return to the ED as soon as possible. Ms. Ace expressed understanding and will have her sister bring her back to the hospital. Dr. Fayette with ID aware.    Damien Quiet, PharmD, BCPS, BCIDP Infectious Diseases Clinical Pharmacist Phone: [phone] 03/04/2024 10:45 AM

## 2024-03-04 NOTE — H&P (Signed)
 History and Physical    Amy [name] FMW:[id number] DOB: 1961-03-29 DOA: 03/04/2024  PCP: Sigrid Deidra Fox, MD   Patient coming from: Home with her son Chief Complaint  Patient presents with   Blood Infection     HPI: Amy [name] is a 63 y.o. female with PMH of chronic diastolic CHF, HTN, T2DM with legal blindness, sleep apnea CKD stage IIIa, presented to the ED positive blood culture.  Patient was initially seen in the ED 10/17 due to nausea dizziness weakness hypotension, low blood sugar-after initial workup in the ED including normal lactic acid chest x-ray discharged home after iv fluids, and  blood culture taht grew MRSA as well as staph epidermidis attempts were made to call her and finally she presented to the ED She has been complaining of mild shortness of breath, increased productive cough but no abdominal pain fever nausea vomiting diarrhea She stays cold all the time. She has right thigh pain. Patient otherwise denies any nausea, vomiting, chest pain, shortness of breath, fever, chills, headache, focal weakness, numbness tingling, speech difficulties , running nose.  In the ED: VSS, labs with creatinine 1.5 baseline hyponatremia 146 normal CBC.  Normal CK COVID RSV flu pending UA pending blood culture ordered and admission was requested Daptomycin was started after discussion with ID  Assessment and plan:  MRSA bacteremia: Presented w/ hypotension, hypoglycemia following unwell few days ago which improved IV fluid hydration send discharge after blood culture that turned out to be positive with staph epi and MRSA.  Starting daptomycin ID consulted, recheck blood culture ordered. Monitor CK daily, check echocardiogram.  DM type 1 with neuropathy nephropathy, blindness and on long-term insulin : Resume home insulin  at low-dose due to recent hypoglycemic events sliding scale insulin .    Hypertension: BP stable will resume home meds-hold losartan due to recent  hypotension  Hypothyroidism: Continue Synthroid .  GERD: Continue PPI  HLD Statin.  OSA: continue CPAP bedtime  Gout: Continue allopurinol .  Legal blindness Son lives with here. She manages her own insulin  by hearing the  click  CKD stage IIIa Hypernatremia: Electrolytes stable at baseline.  Monitor  Chronic diastolic heart failure: Appears euvolemic.   Class I Obesity w/ Body mass index is 33.33 kg/m.: Will benefit with PCP follow-up, weight loss,healthy lifestyle  DVT prophylaxis: enoxaparin  (LOVENOX ) injection 40 mg Start: 03/04/24 1745 Code Status:   Code Status: Full Code  Level of care: Telemetry  Dispo: The patient is from: home            Anticipated disposition: TBD Objective: Vitals last 24 hrs: Vitals:   03/04/24 1402 03/04/24 1600  BP: 120/73   Pulse: 68   Resp: 16   Temp: 98.1 F (36.7 C)   TempSrc: Oral   SpO2: 100%   Weight:  74.8 kg    Physical Examination: General exam: alert awake, oriented, blind, HEENT:Oral mucosa moist, Ear/Nose WNL grossly Respiratory system: Bilaterally clear BS,no use of accessory muscle Cardiovascular system: S1 & S2 +, No JVD. Gastrointestinal system: Abdomen soft,NT,ND, BS+ Nervous System: Alert, awake, moving all extremities,and following commands. Extremities: extremities warm, leg edema neg Skin: Warm, no rashes MSK: Normal muscle bulk,tone, power   Medications reviewed:  Scheduled Meds:  allopurinol   300 mg Oral BID   aspirin  EC  81 mg Oral Daily   [START ON 03/05/2024] celecoxib  200 mg Oral BID   dapagliflozin  propanediol  10 mg Oral Daily   enoxaparin  (LOVENOX ) injection  40 mg Subcutaneous Q24H   fluticasone   2 spray Each Nare Daily   gabapentin   400 mg Oral BID   [START ON 03/05/2024] insulin  aspart  0-9 Units Subcutaneous TID WC   [START ON 03/05/2024] insulin  degludec  2 Units Subcutaneous Daily   levothyroxine   150 mcg Oral Daily   pantoprazole   40 mg Oral Daily   simvastatin   20 mg  Oral QPM   Continuous Infusions:  DAPTOmycin     Diet: Diet Order     None         Severity of Illness: The appropriate patient status for this patient is INPATIENT. Inpatient status is judged to be reasonable and necessary in order to provide the required intensity of service to ensure the patient's safety. The patient's presenting symptoms, physical exam findings, and initial radiographic and laboratory data in the context of their chronic comorbidities is felt to place them at high risk for further clinical deterioration. Furthermore, it is not anticipated that the patient will be medically stable for discharge from the hospital within 2 midnights of admission.   * I certify that at the point of admission it is my clinical judgment that the patient will require inpatient hospital care spanning beyond 2 midnights from the point of admission due to high intensity of service, high risk for further deterioration and high frequency of surveillance required.*  Family Communication: Admission, patients condition and plan of care including tests being ordered have been discussed with the patient and sister who indicate understanding and agree with the plan and Code Status.  Consults called:  ID  Review of Systems: All systems were reviewed and were negative except as mentioned in HPI above. Negative for fever Negative for chest pain Negative for shortness of breath  Past Medical History:  Diagnosis Date   Arthritis    Asthma    Blind    Blindness and low vision    left eye glass eye,  legally blind in right eye   Diabetes mellitus    Diabetic neuropathy (HCC)    Hyperlipidemia    Hypertension    Hypothyroidism    Intractable nausea and vomiting 03/06/2018   Spinal stenosis     Past Surgical History:  Procedure Laterality Date   ABDOMINAL HYSTERECTOMY  1990   BREAST BIOPSY Left    BREAST BIOPSY Right    BREAST BIOPSY Right 08/19/2022   US  RT BREAST BX W LOC DEV 1ST LESION IMG  BX SPEC US  GUIDE 08/19/2022 [facility]   CESAREAN SECTION  1987, 1990   x 2   CHOLECYSTECTOMY  2008   COLONOSCOPY WITH PROPOFOL  N/A 05/05/2023   Procedure: COLONOSCOPY WITH PROPOFOL ;  Surgeon: Rollin Dover, MD;  Location: [facility];  Service: Gastroenterology;  Laterality: N/A;   ENUCLEATION Bilateral 09/15/1998   EYE SURGERY  2016   fitting artificial eye   POLYPECTOMY  05/05/2023   Procedure: POLYPECTOMY;  Surgeon: Rollin Dover, MD;  Location: [facility];  Service: Gastroenterology;;     reports that she quit smoking about 31 years ago. Her smoking use included cigarettes. She has never used smokeless tobacco. She reports that she does not drink alcohol and does not use drugs.  Allergies  Allergen Reactions   Penicillins Hives and Swelling    Has patient had a PCN reaction causing immediate rash, facial/tongue/throat swelling, SOB or lightheadedness with hypotension: yes- face swelling Has patient had a PCN reaction causing severe rash involving mucus membranes or skin necrosis: no Has patient had a PCN reaction that required hospitalization unknown (childhood  allergy) Has patient had a PCN reaction occurring within the last 10 years: no If all of the above answers are NO, then may proceed with Cephalosporin use.    Tramadol  Nausea And Vomiting    Intense nausea   Vicodin [Hydrocodone -Acetaminophen ] Nausea And Vomiting   Codeine Nausea Only    Other reaction(s): Unknown   Iodine-131 Hives   Iohexol Hives    Pt developed itching and hives along with nasal congestion; needs 13 hour premeds for future studies, Onset Date: [id number]    Lisinopril Cough    Other reaction(s): Unknown   Sulfa Antibiotics Nausea And Vomiting    Family History  Problem Relation Age of Onset   Diabetes Sister    Glaucoma Sister    Hypertension Sister    Healthy Mother    Healthy Father    Stroke Brother    Heart attack Paternal Aunt    Breast cancer Neg Hx      Prior to  Admission medications   Medication Sig Start Date End Date Taking? Authorizing Provider  albuterol  (PROVENTIL  HFA;VENTOLIN  HFA) 108 (90 BASE) MCG/ACT inhaler Inhale 2 puffs into the lungs every 4 (four) hours as needed for wheezing or shortness of breath. For short    [provider]  allopurinol  (ZYLOPRIM ) 300 MG tablet Take 300 mg by mouth 2 (two) times daily. 11/23/20   [provider]  aspirin  EC 81 MG tablet Take 81 mg by mouth daily.    [provider]  blood glucose meter kit and supplies Dispense based on patient and insurance preference. Use up to four times daily as directed. (FOR ICD-10 E10.9, E11.9). 08/06/20   Levora Reyes SAUNDERS, MD  Blood Glucose Monitoring Suppl (PRODIGY AUTOCODE BLOOD GLUCOSE) w/Device KIT 1 Device by Does not apply route daily in the afternoon. 09/21/23   Shamleffer, Ibtehal Jaralla, MD  celecoxib (CELEBREX) 200 MG capsule Take 200 mg by mouth 2 (two) times daily. 02/14/19   [provider]  cephALEXin  (KEFLEX ) 500 MG capsule Take 1 capsule (500 mg total) by mouth 2 (two) times daily. 03/02/24   Patsey Lot, MD  cetirizine (ZYRTEC) 10 MG tablet Take 10 mg by mouth at bedtime.    [provider]  Continuous Blood Gluc Sensor (DEXCOM G7 SENSOR) MISC 1 Device by Does not apply route as directed. 10/19/21   Shamleffer, Ibtehal Jaralla, MD  dapagliflozin  propanediol (FARXIGA ) 10 MG TABS tablet Take 1 tablet (10 mg total) by mouth daily. 09/21/23   Shamleffer, Donell Cardinal, MD  docusate sodium  (COLACE) 100 MG capsule Take 100 mg by mouth 2 (two) times daily. 10/26/22   [provider]  Dulaglutide  (TRULICITY ) 3 MG/0.5ML SOAJ Inject 3 mg as directed once a week. Patient taking differently: Inject 3 mg as directed once a week. On fridays 09/21/23   Shamleffer, Ibtehal Jaralla, MD  EMBRACE TALK GLUCOSE TEST test strip USE TO CHECK BLOOD SUGARS FOUR TIMES A DAY BEFORE MEALS AND AT BEDTIME DX E11.29 07/29/20   Levora Reyes SAUNDERS,  MD  esomeprazole  (NEXIUM ) 40 MG capsule Take 1 capsule (40 mg total) by mouth daily. 07/10/20   Bensimhon, Toribio SAUNDERS, MD  fluticasone  (FLONASE ) 50 MCG/ACT nasal spray Place 2 sprays into both nostrils daily. 12/16/22   [provider]  gabapentin  (NEURONTIN ) 400 MG capsule Take 400 mg by mouth 2 (two) times daily. 08/10/21   [provider]  glucose blood (ONETOUCH VERIO) test strip Check blood sugar 1-2 times daily 09/21/23   Shamleffer, Ibtehal Jaralla,  MD  hydrOXYzine  (ATARAX /VISTARIL ) 25 MG tablet Take 0.5 tablets (12.5 mg total) by mouth every 8 (eight) hours as needed for itching. 12/05/20   Christopher Savannah, PA-C  insulin  degludec (TRESIBA  FLEXTOUCH) 200 UNIT/ML FlexTouch Pen Inject 8 Units into the skin daily at 6 (six) AM. Patient taking differently: Inject 8 Units into the skin at bedtime. 03/23/23   Shamleffer, Ibtehal Jaralla, MD  insulin  lispro (HUMALOG ) 100 UNIT/ML KwikPen MAX DAILY 30 UNITS Patient taking differently: Inject 0-30 Units into the skin 3 (three) times daily. PER SLIDING SCALE MAX DAILY 30 UNITS 09/21/23   Shamleffer, Donell Cardinal, MD  Insulin  Pen Needle 32G X 4 MM MISC 1 Device by Does not apply route in the morning, at noon, in the evening, and at bedtime. 09/21/23   Shamleffer, Ibtehal Jaralla, MD  levothyroxine  (SYNTHROID ) 150 MCG tablet Take 1 tablet (150 mcg total) by mouth daily. Needs appt for future refills 08/06/20   Levora Reyes SAUNDERS, MD  naproxen  (NAPROSYN ) 500 MG tablet Take 500 mg by mouth 2 (two) times daily with a meal.    [provider]  olmesartan (BENICAR) 40 MG tablet Take 40 mg by mouth daily. 02/01/23   [provider]  polyethylene glycol powder (MIRALAX ) 17 GM/SCOOP powder Take 17 g by mouth daily as needed for moderate constipation. 08/03/23   [provider]  Prodigy Twist Top Lancets 28G MISC USE TO CHECK BLOOD SUGARS 10/02/20   [provider]  simvastatin  (ZOCOR ) 20 MG tablet Take 1 tablet (20 mg total) by  mouth every evening. 08/06/20   Levora Reyes SAUNDERS, MD   Labs on Admission: I have personally reviewed following labs and imaging studies  CBC: Recent Labs  Lab 03/01/24 2045 03/01/24 2049 03/04/24 1424  WBC  --  7.4 8.3  NEUTROABS  --   --  5.6  HGB 12.9 12.5 13.7  HCT 38.0 40.8 43.9  MCV  --  84.1 83.1  PLT  --  153 159   Basic Metabolic Panel: Recent Labs  Lab 03/01/24 2045 03/01/24 2049 03/04/24 1424  NA 148* 147* 146*  K 3.5 3.7 3.8  CL 109 112* 109  CO2  --  25 27  GLUCOSE 31* 32* 122*  BUN 23 20 25*  CREATININE 1.90* 1.60* 1.50*  CALCIUM   --  9.5 9.9   Estimated Creatinine Clearance: 33.4 mL/min (A) (by C-G formula based on SCr of 1.5 mg/dL (H)). Recent Labs  Lab 03/01/24 2049  AST 27  ALT 8  ALKPHOS 80  BILITOT 0.3  PROT 5.8*  ALBUMIN  3.2*   No results for input(s): LIPASE, AMYLASE in the last 168 hours. No results for input(s): AMMONIA in the last 168 hours. Coagulation Profile: No results for input(s): INR, PROTIME in the last 168 hours. Cardiac Panel (last 3 results) Recent Labs    03/04/24 1424  CKTOTAL 44    BNP (last 3 results) No results for input(s): PROBNP in the last 8760 hours. HbA1C: No results for input(s): HGBA1C in the last 72 hours. CBG: Recent Labs  Lab 03/01/24 2118 03/01/24 2254 03/02/24 0102 03/02/24 0347 03/02/24 0758  GLUCAP 75 91 125* 195* 270*   Lipid Profile: No results for input(s): CHOL, HDL, LDLCALC, TRIG, CHOLHDL, LDLDIRECT in the last 72 hours. Thyroid  Function Tests: No results for input(s): TSH, T4TOTAL, FREET4, T3FREE, THYROIDAB in the last 72 hours. Urine analysis:    Component Value Date/Time   COLORURINE YELLOW 03/02/2024 0009   APPEARANCEUR HAZY (A) 03/02/2024 0009  LABSPEC 1.014 03/02/2024 0009   PHURINE 5.0 03/02/2024 0009   GLUCOSEU >=500 (A) 03/02/2024 0009   HGBUR NEGATIVE 03/02/2024 0009   BILIRUBINUR NEGATIVE 03/02/2024 0009   KETONESUR NEGATIVE  03/02/2024 0009   PROTEINUR 100 (A) 03/02/2024 0009   UROBILINOGEN 0.2 11/06/2021 1202   NITRITE NEGATIVE 03/02/2024 0009   LEUKOCYTESUR TRACE (A) 03/02/2024 0009    Radiological Exams on Admission: No results found.    Mennie LAMY MD [facility]  If 7PM-7AM, please contact night-coverage [email or web address]  03/04/2024, 5:32 PM

## 2024-03-04 NOTE — Plan of Care (Signed)

## 2024-03-05 ENCOUNTER — Inpatient Hospital Stay (HOSPITAL_COMMUNITY)

## 2024-03-05 DIAGNOSIS — R7881 Bacteremia: Secondary | ICD-10-CM

## 2024-03-05 DIAGNOSIS — E11649 Type 2 diabetes mellitus with hypoglycemia without coma: Secondary | ICD-10-CM

## 2024-03-05 DIAGNOSIS — R109 Unspecified abdominal pain: Secondary | ICD-10-CM

## 2024-03-05 DIAGNOSIS — M549 Dorsalgia, unspecified: Secondary | ICD-10-CM

## 2024-03-05 DIAGNOSIS — B9562 Methicillin resistant Staphylococcus aureus infection as the cause of diseases classified elsewhere: Secondary | ICD-10-CM | POA: Diagnosis not present

## 2024-03-05 DIAGNOSIS — R197 Diarrhea, unspecified: Secondary | ICD-10-CM

## 2024-03-05 DIAGNOSIS — Z794 Long term (current) use of insulin: Secondary | ICD-10-CM

## 2024-03-05 DIAGNOSIS — H548 Legal blindness, as defined in USA: Secondary | ICD-10-CM

## 2024-03-05 DIAGNOSIS — B958 Unspecified staphylococcus as the cause of diseases classified elsewhere: Secondary | ICD-10-CM

## 2024-03-05 LAB — BASIC METABOLIC PANEL WITH GFR
Anion gap: 13 (ref 5–15)
BUN: 21 mg/dL (ref 8–23)
CO2: 23 mmol/L (ref 22–32)
Calcium: 9.6 mg/dL (ref 8.9–10.3)
Chloride: 109 mmol/L (ref 98–111)
Creatinine, Ser: 1.32 mg/dL — ABNORMAL HIGH (ref 0.44–1.00)
GFR, Estimated: 45 mL/min — ABNORMAL LOW (ref >60)
Glucose, Bld: 201 mg/dL — ABNORMAL HIGH (ref 70–99)
Potassium: 4 mmol/L (ref 3.5–5.1)
Sodium: 145 mmol/L (ref 135–145)

## 2024-03-05 LAB — GLUCOSE, CAPILLARY
Glucose-Capillary: 205 mg/dL — ABNORMAL HIGH (ref 70–99)
Glucose-Capillary: 200 mg/dL — ABNORMAL HIGH (ref 70–99)
Glucose-Capillary: 234 mg/dL — ABNORMAL HIGH (ref 70–99)
Glucose-Capillary: 193 mg/dL — ABNORMAL HIGH (ref 70–99)

## 2024-03-05 LAB — ECHOCARDIOGRAM COMPLETE
AR max vel: 1.95 cm2
AV Area VTI: 1.88 cm2
AV Area mean vel: 2.19 cm2
AV Mean grad: 5 mmHg
AV Peak grad: 11 mmHg
Ao pk vel: 1.66 m/s
Area-P 1/2: 3.55 cm2
Calc EF: 70.4 %
MV M vel: 3.65 m/s
MV Peak grad: 53.3 mmHg
S' Lateral: 2.2 cm
Single Plane A2C EF: 65.6 %
Single Plane A4C EF: 75.1 %

## 2024-03-05 LAB — C DIFFICILE QUICK SCREEN W PCR REFLEX
C Diff antigen: NEGATIVE
C Diff interpretation: NOT DETECTED
C Diff toxin: NEGATIVE

## 2024-03-05 LAB — CBC
HCT: 42.9 % (ref 36.0–46.0)
Hemoglobin: 13.4 g/dL (ref 12.0–15.0)
MCH: 26.6 pg (ref 26.0–34.0)
MCHC: 31.2 g/dL (ref 30.0–36.0)
MCV: 85.1 fL (ref 80.0–100.0)
Platelets: 157 K/uL (ref 150–400)
RBC: 5.04 MIL/uL (ref 3.87–5.11)
RDW: 15.9 % — ABNORMAL HIGH (ref 11.5–15.5)
WBC: 7.3 K/uL (ref 4.0–10.5)
nRBC: 0 % (ref 0.0–0.2)

## 2024-03-05 MED ORDER — GADOBUTROL 1 MMOL/ML IV SOLN
7.0000 mL | Freq: Once | INTRAVENOUS | Status: AC | PRN
  Administered 2024-03-05: 7 mL via INTRAVENOUS

## 2024-03-05 MED ORDER — DAPTOMYCIN-SODIUM CHLORIDE 500-0.9 MG/50ML-% IV SOLN
8.0000 mg/kg | INTRAVENOUS | Status: DC
  Administered 2024-03-05: 500 mg via INTRAVENOUS
  Filled 2024-03-05: qty 50

## 2024-03-05 MED ORDER — DAPTOMYCIN-SODIUM CHLORIDE 500-0.9 MG/50ML-% IV SOLN
8.0000 mg/kg | Freq: Every day | INTRAVENOUS | Status: DC
Start: 2024-03-06 — End: 2024-03-07
  Administered 2024-03-06: 500 mg via INTRAVENOUS
  Filled 2024-03-05 (×2): qty 50

## 2024-03-05 NOTE — Progress Notes (Signed)
   03/05/24 2300  BiPAP/CPAP/SIPAP  $ Face Mask Medium Yes  BiPAP/CPAP/SIPAP Pt Type Adult  BiPAP/CPAP/SIPAP DREAMSTATIOND  Mask Type Full face mask  Dentures removed? Not applicable  Mask Size Medium  Patient Home Machine No  Patient Home Mask No  Patient Home Tubing No  Auto Titrate Yes  Minimum cmH2O 5 cmH2O  Maximum cmH2O 20 cmH2O  CPAP/SIPAP surface wiped down Yes  Device Plugged into RED Power Outlet Yes  BiPAP/CPAP /SiPAP Vitals  Pulse Rate 78  BP 113/60  SpO2 99 %  Bilateral Breath Sounds Clear;Diminished  MEWS Score/Color  MEWS Score 0  MEWS Score Color Landy

## 2024-03-05 NOTE — Evaluation (Signed)
 Occupational Therapy Evaluation Patient Details Name: Amy [name] MRN: [id number] DOB: 09-14-1960 Today's Date: 03/05/2024   History of Present Illness   63 y.o. female presented to the ED c/o SOB, productive cough, positive blood culture, found to have MRSA and staph epidermis  with PMH of chronic diastolic CHF, HTN, T2DM with legal blindness, sleep apnea CKD stage IIIa,     Clinical Impressions Pt resting in bed comfortably, c/o chronic B knee pain. Pt lives alone, has a son who helps from 9-2 each day, reports she has PCA on the weekends morning/afternoon. PLOF Pt reports using rollator mod I, white cane, able to make simple meals, complete dressing without assistance but needs help for socks/shoes. Pt currently close to baseline, CGA with RW for ambulation for safety/direction due to legal blindness, fair overall strength and activity tolerance. Pt would benefit from new rollator, BSC, and tub bench as she says the rollator and shower seat at home are not in good condition. Recommending HHOT follow up to maximize safety in home, at this time has no further acute OT needs Pt is at baseline.      If plan is discharge home, recommend the following:   A little help with walking and/or transfers;A little help with bathing/dressing/bathroom;Assistance with cooking/housework;Assist for transportation     Functional Status Assessment   Patient has had a recent decline in their functional status and demonstrates the ability to make significant improvements in function in a reasonable and predictable amount of time.     Equipment Recommendations   Other (comment);Tub/shower bench;BSC/3in1 (rollator)     Recommendations for Other Services         Precautions/Restrictions   Precautions Precautions: Fall Recall of Precautions/Restrictions: Intact Restrictions Weight Bearing Restrictions Per Provider Order: No     Mobility Bed Mobility Overal bed mobility: Needs  Assistance Bed Mobility: Supine to Sit, Sit to Supine     Supine to sit: Supervision Sit to supine: Supervision   General bed mobility comments: supervision for safety and positioning    Transfers Overall transfer level: Needs assistance Equipment used: Rolling walker (2 wheels) Transfers: Sit to/from Stand Sit to Stand: Contact guard assist           General transfer comment: CGA for direction, safety      Balance Overall balance assessment: Mild deficits observed, not formally tested                                         ADL either performed or assessed with clinical judgement   ADL Overall ADL's : At baseline;Needs assistance/impaired                                       General ADL Comments: set up/CGA for safety. Pt close to or at baseline, legally blind limiting independence with ADLs. Pt has son/aide during the day for a few hours to help with meals and cleaning, bathing, able to dress and fix simple meals herself. Ambulated CGA with RW for direction     Vision Ability to See in Adequate Light: 4 Severely impaired Patient Visual Report: Other (comment) (blind)       Perception         Praxis         Pertinent Vitals/Pain Pain Assessment Pain Assessment: 0-10  Pain Score: 5  Pain Location: B knees Pain Descriptors / Indicators: Aching, Constant Pain Intervention(s): Monitored during session     Extremity/Trunk Assessment Upper Extremity Assessment Upper Extremity Assessment: Overall WFL for tasks assessed   Lower Extremity Assessment Lower Extremity Assessment: Defer to PT evaluation       Communication Communication Communication: No apparent difficulties   Cognition Arousal: Alert Behavior During Therapy: WFL for tasks assessed/performed Cognition: No family/caregiver present to determine baseline             OT - Cognition Comments: Pt A/O x3, knows it's october, not sure what year.                  Following commands: Intact       Cueing  General Comments   Cueing Techniques: Verbal cues      Exercises     Shoulder Instructions      Home Living Family/patient expects to be discharged to:: Private residence Living Arrangements: Alone Available Help at Discharge: Family;Available PRN/intermittently Type of Home: Apartment Home Access: Level entry     Home Layout: One level     Bathroom Shower/Tub: Tub/shower unit         Home Equipment: Educational psychologist (4 wheels);Other (comment) (white cane)   Additional Comments: Pt's son helps from 9-2 M-F, has a PCA from 9-4 saturday/sunday.      Prior Functioning/Environment Prior Level of Function : Needs assist             Mobility Comments: rollator, white cane ADLs Comments: Pt needs help with bathing, cooking/cleaning, able to cook simple meals and dress herself.    OT Problem List: Decreased activity tolerance;Impaired vision/perception;Pain   OT Treatment/Interventions:        OT Goals(Current goals can be found in the care plan section)   Acute Rehab OT Goals Patient Stated Goal: to return home OT Goal Formulation: With patient Time For Goal Achievement: 03/19/24 Potential to Achieve Goals: Good   OT Frequency:       Co-evaluation              AM-PAC OT 6 Clicks Daily Activity     Outcome Measure Help from another person eating meals?: A Little Help from another person taking care of personal grooming?: A Little Help from another person toileting, which includes using toliet, bedpan, or urinal?: A Little Help from another person bathing (including washing, rinsing, drying)?: A Little Help from another person to put on and taking off regular upper body clothing?: A Little Help from another person to put on and taking off regular lower body clothing?: A Little 6 Click Score: 18   End of Session Equipment Utilized During Treatment: Gait belt;Rolling walker (2  wheels) Nurse Communication: Mobility status  Activity Tolerance: Patient tolerated treatment well Patient left: in bed;with call bell/phone within reach  OT Visit Diagnosis: Other abnormalities of gait and mobility (R26.89);Pain Pain - part of body: Knee                Time: 1226-1300 OT Time Calculation (min): 34 min Charges:  OT General Charges $OT Visit: 1 Visit OT Evaluation $OT Eval Low Complexity: 1 Low OT Treatments $Self Care/Home Management : 8-22 mins  [address], OTR/L   Amy [name] 03/05/2024, 1:13 PM

## 2024-03-05 NOTE — Plan of Care (Signed)

## 2024-03-05 NOTE — Progress Notes (Signed)
Pt placed on CPAP for night rest tolerating well

## 2024-03-05 NOTE — Progress Notes (Signed)
 PROGRESS NOTE Amy [name]  FMW:[id number] DOB: October 19, 1960 DOA: 03/04/2024 PCP: Sigrid Deidra Fox, MD  Brief Narrative/Hospital Course: Amy [name] is a 63 y.o. female with PMH of chronic diastolic CHF, HTN, T2DM with legal blindness, sleep apnea CKD stage IIIa, presented to the ED positive blood culture.  Patient was initially seen in the ED 10/17 due to nausea dizziness weakness hypotension, low blood sugar-after initial workup in the ED including normal lactic acid chest x-ray discharged home after iv fluids, and  blood culture taht grew MRSA as well as staph epidermidis attempts were made to call her and finally she presented to the EDShe has been complaining of mild shortness of breath, increased productive cough but no abdominal pain fever nausea vomiting diarrhea She stays cold all the time. She has right thigh pain. Patient otherwise denies any nausea, vomiting, chest pain, shortness of breath, fever, chills, headache, focal weakness, numbness tingling, speech difficulties , running nose. In the ED: VSS, labs with creatinine 1.5 baseline hyponatremia 146 normal CBC.  Normal CK COVID RSV flu pending UA pending blood culture ordered and admission was requested Daptomycin was started after discussion with ID  Subjective: Seen and examined Some thigh pain no other new complaint Overnight patient hemodynamically stable afebrile used CPAP at bedtime  Assessment and plan:  MRSA bacteremia: Source unclear, presented w/ hypotension, hypoglycemia and feeling unwell few days ago which improved q/ IV fluid hydration- discharged after blood culture that turned out to be positive with staph epi and MRSA.  Readmitted for bacteremia ID following, continue daptomycin follow-up echocardiogram.  Recheck blood culture sent 8/20.  Monitor CK Patient having some thigh pain and leg pain, planning to obtain CT abdominal pelvis w/o (has contrast allergy) and MRI of the thoracic and lumbar spine due to  bacteremia to rule out discitis  DM type 1 with neuropathy nephropathy, blindness and on long-term insulin : Cont insulin  at low-dose due to recent hypoglycemic events sliding scale insulin .   Recent Labs  Lab 03/02/24 0102 03/02/24 0347 03/02/24 0758 03/04/24 2153 03/05/24 0733  GLUCAP 125* 195* 270* 168* 205*    CKD stage IIIa Hypernatremia: Electrolytes stable at baseline.  Cont to monitor  Hypertension: BP stable-cont to hold losartan due to recent hypotension  Hypothyroidism: Continue Synthroid .  GERD: Continue PPI  HLD Statin.  OSA: continue CPAP bedtime  Gout: Continue allopurinol .  Legal blindness Son lives with here. She manages her own insulin  by hearing the  click  Chronic diastolic heart failure: Appears euvolemic.   Class I Obesity w/ Body mass index is 33.33 kg/m.: Will benefit with PCP follow-up, weight loss,healthy lifestyle  DVT prophylaxis: enoxaparin  (LOVENOX ) injection 40 mg Start: 03/04/24 2200 Code Status:   Code Status: Full Code  Level of care: Telemetry  Dispo: The patient is from: home            Anticipated disposition: TBD Objective: Vitals last 24 hrs: Vitals:   03/04/24 2238 03/05/24 0118 03/05/24 0434 03/05/24 0943  BP:  129/84 114/67 92/64  Pulse:  73 71 79  Resp:  17 19 16   Temp:   (!) 97.4 F (36.3 C) 98.1 F (36.7 C)  TempSrc:   Oral   SpO2:  100% 100% 100%  Weight:      Height: 4' 11 (1.499 m)       Physical Examination: General exam: aaox3 HEENT:Oral mucosa moist, Ear/Nose WNL grossly Respiratory system: Bilaterally clear BS,no use of accessory muscle Cardiovascular system: S1 & S2 +, No JVD.  Gastrointestinal system: Abdomen soft,NT,ND, BS+ Nervous System: Alert, awake, moving all extremities,and following commands. Extremities: extremities warm, leg edema neg Skin: Warm, no rashes MSK: Normal muscle bulk,tone, power   Medications reviewed:  Scheduled Meds:  allopurinol   300 mg Oral BID   aspirin   EC  81 mg Oral Daily   celecoxib  200 mg Oral BID   dapagliflozin  propanediol  10 mg Oral Daily   enoxaparin  (LOVENOX ) injection  40 mg Subcutaneous Q24H   fluticasone   2 spray Each Nare Daily   gabapentin   400 mg Oral BID   insulin  aspart  0-9 Units Subcutaneous TID WC   insulin  glargine-yfgn  2 Units Subcutaneous Daily   levothyroxine   150 mcg Oral Daily   pantoprazole   40 mg Oral Daily   simvastatin   20 mg Oral QPM   Continuous Infusions:  DAPTOmycin 500 mg (03/05/24 0835)   Diet: Diet Order             Diet Carb Modified Fluid consistency: Thin; Room service appropriate? Yes  Diet effective now                    Data Reviewed: I have personally reviewed following labs and imaging studies ( see epic result tab) CBC: Recent Labs  Lab 03/01/24 2045 03/01/24 2049 03/04/24 1424 03/05/24 0525  WBC  --  7.4 8.3 7.3  NEUTROABS  --   --  5.6  --   HGB 12.9 12.5 13.7 13.4  HCT 38.0 40.8 43.9 42.9  MCV  --  84.1 83.1 85.1  PLT  --  153 159 157   CMP: Recent Labs  Lab 03/01/24 2045 03/01/24 2049 03/04/24 1424 03/05/24 0525  NA 148* 147* 146* 145  K 3.5 3.7 3.8 4.0  CL 109 112* 109 109  CO2  --  25 27 23   GLUCOSE 31* 32* 122* 201*  BUN 23 20 25* 21  CREATININE 1.90* 1.60* 1.50* 1.32*  CALCIUM   --  9.5 9.9 9.6   GFR: Estimated Creatinine Clearance: 37.9 mL/min (A) (by C-G formula based on SCr of 1.32 mg/dL (H)). Recent Labs  Lab 03/01/24 2049  AST 27  ALT 8  ALKPHOS 80  BILITOT 0.3  PROT 5.8*  ALBUMIN  3.2*   No results for input(s): LIPASE, AMYLASE in the last 168 hours. No results for input(s): AMMONIA in the last 168 hours. Coagulation Profile: No results for input(s): INR, PROTIME in the last 168 hours. Unresulted Labs (From admission, onward)     Start     Ordered   03/11/24 0500  Creatinine, serum  (enoxaparin  (LOVENOX )    CrCl >/= 30 ml/min)  Weekly,   R     Comments: while on enoxaparin  therapy    03/04/24 1731   03/05/24 1046  C  Difficile Quick Screen w PCR reflex  (C Difficile quick screen w PCR reflex panel )  Once, for 24 hours,   TIMED       References:    CDiff Information Tool   03/05/24 1049   03/05/24 0500  Basic metabolic panel with GFR  Daily,   R      03/04/24 1731   03/05/24 0500  CBC  Daily,   R      03/04/24 1731   03/04/24 1629  CK  Weekly,   R      03/04/24 1629           Antimicrobials/Microbiology: Anti-infectives (From admission, onward)    Start  Dose/Rate Route Frequency Ordered Stop   03/05/24 0805  DAPTOmycin (CUBICIN) IVPB 500 mg/40mL premix        8 mg/kg  57 kg (Order-Specific) 100 mL/hr over 30 Minutes Intravenous Every 48 hours 03/05/24 0805     03/04/24 1630  DAPTOmycin (CUBICIN) 600 mg in sodium chloride  0.9 % IVPB  Status:  Discontinued        8 mg/kg  74.8 kg 124 mL/hr over 30 Minutes Intravenous Daily 03/04/24 1629 03/05/24 0805         Component Value Date/Time   SDES  03/04/2024 1500    BLOOD RIGHT FOREARM Performed at [facility], [address] [place], [place] [address]    SPECREQUEST  03/04/2024 1500    BOTTLES DRAWN AEROBIC AND ANAEROBIC Blood Culture results may not be optimal due to an inadequate volume of blood received in culture bottles Performed at [facility], [address] [place], [place] [address]    CULT  03/04/2024 1500    NO GROWTH < 12 HOURS Performed at [facility], [address] [place], [place] [address]    REPTSTATUS PENDING 03/04/2024 1500    Procedures:    Mennie LAMY, MD [facility] 03/05/2024, 10:50 AM

## 2024-03-05 NOTE — Progress Notes (Signed)
 Date of Admission:  03/04/2024     ID: YUETTE [name] is a 63 y.o. female  Principal Problem:   MRSA bacteremia Active Problems:   DM type 1, not at goal, causing eye disease (HCC)   Hypothyroidism   Hypernatremia   Type 2 diabetes mellitus with complication, with long-term current use of insulin  (HCC)   Hyperlipidemia LDL goal <70   Spinal stenosis, multilevel   Essential hypertension   Chronic diastolic heart failure (HCC)   Obstructive sleep apnea on CPAP  Pt had MRI of the spine and CT abdomen today  Subjective: States she is tired and sleepy because of the test Still has mid back pain Pain thighs Medications:   allopurinol   300 mg Oral BID   aspirin  EC  81 mg Oral Daily   celecoxib  200 mg Oral BID   dapagliflozin  propanediol  10 mg Oral Daily   enoxaparin  (LOVENOX ) injection  40 mg Subcutaneous Q24H   fluticasone   2 spray Each Nare Daily   gabapentin   400 mg Oral BID   insulin  aspart  0-9 Units Subcutaneous TID WC   insulin  glargine-yfgn  2 Units Subcutaneous Daily   levothyroxine   150 mcg Oral Daily   pantoprazole   40 mg Oral Daily    Objective: Vital signs in last 24 hours: Patient Vitals for the past 24 hrs:  BP Temp Temp src Pulse Resp SpO2 Height Weight  03/05/24 1157 113/60 98.8 F (37.1 C) -- 73 15 99 % -- --  03/05/24 0943 92/64 98.1 F (36.7 C) -- 79 16 100 % -- --  03/05/24 0434 114/67 (!) 97.4 F (36.3 C) Oral 71 19 100 % -- --  03/05/24 0118 129/84 -- -- 73 17 100 % -- --  03/04/24 2238 -- -- -- -- -- -- 4' 11 (1.499 m) --  03/04/24 2230 -- -- -- -- -- -- 4' 1.32 (1.253 m) 74.8 kg  03/04/24 2143 (!) 157/69 98.1 F (36.7 C) Oral 67 18 98 % -- --  03/04/24 1945 (!) 157/91 -- -- 69 -- 97 % -- --  03/04/24 1748 (!) 157/61 97.7 F (36.5 C) Oral (!) 59 16 100 % -- --     PHYSICAL EXAM:  General: Alert, cooperative, no distress, appears stated age. Legally blind Lungs: Clear to auscultation bilaterally. No Wheezing or Rhonchi. No  rales. Heart: Regular rate and rhythm, no murmur, rub or gallop. Abdomen: Soft, non-tender,not distended. Bowel sounds normal. No masses Extremities: atraumatic, no cyanosis. No edema. No clubbing Skin: No rashes or lesions. Or bruising Lymph: Cervical, supraclavicular normal. Tenderness thigh muscles on palpation Neurologic: Grossly non-focal  Lab Results    Latest Ref Rng & Units 03/05/2024    5:25 AM 03/04/2024    2:24 PM 03/01/2024    8:49 PM  CBC  WBC 4.0 - 10.5 K/uL 7.3  8.3  7.4   Hemoglobin 12.0 - 15.0 g/dL 86.5  86.2  87.4   Hematocrit 36.0 - 46.0 % 42.9  43.9  40.8   Platelets 150 - 400 K/uL 157  159  153        Latest Ref Rng & Units 03/05/2024    5:25 AM 03/04/2024    2:24 PM 03/01/2024    8:49 PM  CMP  Glucose 70 - 99 mg/dL 798  877  32   BUN 8 - 23 mg/dL 21  25  20    Creatinine 0.44 - 1.00 mg/dL 8.67  8.49  8.39   Sodium  135 - 145 mmol/L 145  146  147   Potassium 3.5 - 5.1 mmol/L 4.0  3.8  3.7   Chloride 98 - 111 mmol/L 109  109  112   CO2 22 - 32 mmol/L 23  27  25    Calcium  8.9 - 10.3 mg/dL 9.6  9.9  9.5   Total Protein 6.5 - 8.1 g/dL   5.8   Total Bilirubin 0.0 - 1.2 mg/dL   0.3   Alkaline Phos 38 - 126 U/L   80   AST 15 - 41 U/L   27   ALT 0 - 44 U/L   8       Microbiology: 03/02/24 BC 1 of 2 set- MRSA and staph capitis Studies/Results: ECHOCARDIOGRAM COMPLETE Result Date: 03/05/2024    ECHOCARDIOGRAM REPORT   Patient Name:   Amy [name] Date of Exam: 03/05/2024 Medical Rec #:  [id number]      Height:       59.0 in Accession #:    [id number]     Weight:       165.0 lb Date of Birth:  Jun 29, 1960     BSA:          1.700 m Patient Age:    62 years       BP:           92/64 mmHg Patient Gender: F              HR:           62 bpm. Exam Location:  Inpatient Procedure: 2D Echo, Cardiac Doppler and Color Doppler (Both Spectral and Color            Flow Doppler were utilized during procedure). Indications:    Bacteremia R78.81  History:        Patient has  prior history of Echocardiogram examinations, most                 recent 11/16/2022. CHF, COPD, Signs/Symptoms:Shortness of Breath;                 Risk Factors:Diabetes. H/O Hyperlipidemia, weakness.  Sonographer:    BERNARDA ROCKS Referring Phys: [id number] RAMESH KC IMPRESSIONS  1. Left ventricular ejection fraction, by estimation, is 60 to 65%. The left ventricle has normal function. The left ventricle has no regional wall motion abnormalities. Left ventricular diastolic parameters are consistent with Grade I diastolic dysfunction (impaired relaxation).  2. Right ventricular systolic function is normal. The right ventricular size is normal.  3. The mitral valve is normal in structure. No evidence of mitral valve regurgitation. No evidence of mitral stenosis.  4. The aortic valve is normal in structure. Aortic valve regurgitation is not visualized. No aortic stenosis is present.  5. The inferior vena cava is normal in size with greater than 50% respiratory variability, suggesting right atrial pressure of 3 mmHg. Comparison(s): No significant change from prior study. Prior images reviewed side by side. Conclusion(s)/Recommendation(s): No evidence of valvular vegetations on this transthoracic echocardiogram. Consider a transesophageal echocardiogram to exclude infective endocarditis if clinically indicated. FINDINGS  Left Ventricle: Left ventricular ejection fraction, by estimation, is 60 to 65%. The left ventricle has normal function. The left ventricle has no regional wall motion abnormalities. The left ventricular internal cavity size was normal in size. There is  no left ventricular hypertrophy. Left ventricular diastolic parameters are consistent with Grade I diastolic dysfunction (impaired relaxation). Right Ventricle: The right ventricular size is normal. No increase in  right ventricular wall thickness. Right ventricular systolic function is normal. Left Atrium: Left atrial size was normal in size. Right Atrium:  Right atrial size was normal in size. Pericardium: There is no evidence of pericardial effusion. Mitral Valve: The mitral valve is normal in structure. No evidence of mitral valve regurgitation. No evidence of mitral valve stenosis. MV peak gradient, 6.6 mmHg. The mean mitral valve gradient is 3.0 mmHg. Tricuspid Valve: The tricuspid valve is normal in structure. Tricuspid valve regurgitation is not demonstrated. No evidence of tricuspid stenosis. Aortic Valve: The aortic valve is normal in structure. Aortic valve regurgitation is not visualized. No aortic stenosis is present. Aortic valve mean gradient measures 5.0 mmHg. Aortic valve peak gradient measures 11.0 mmHg. Aortic valve area, by VTI measures 1.88 cm. Pulmonic Valve: The pulmonic valve was normal in structure. Pulmonic valve regurgitation is trivial. No evidence of pulmonic stenosis. Aorta: The aortic root is normal in size and structure. Venous: The inferior vena cava is normal in size with greater than 50% respiratory variability, suggesting right atrial pressure of 3 mmHg. IAS/Shunts: No atrial level shunt detected by color flow Doppler.  LEFT VENTRICLE PLAX 2D LVIDd:         4.00 cm     Diastology LVIDs:         2.20 cm     LV e' medial:    6.00 cm/s LV PW:         0.80 cm     LV E/e' medial:  15.1 LV IVS:        0.90 cm     LV e' lateral:   8.05 cm/s LVOT diam:     1.73 cm     LV E/e' lateral: 11.3 LV SV:         71 LV SV Index:   42 LVOT Area:     2.35 cm  LV Volumes (MOD) LV vol d, MOD A2C: 60.2 ml LV vol d, MOD A4C: 75.1 ml LV vol s, MOD A2C: 20.7 ml LV vol s, MOD A4C: 18.7 ml LV SV MOD A2C:     39.5 ml LV SV MOD A4C:     75.1 ml LV SV MOD BP:      47.9 ml RIGHT VENTRICLE             IVC RV Basal diam:  3.20 cm     IVC diam: 1.30 cm RV S prime:     13.30 cm/s TAPSE (M-mode): 1.9 cm LEFT ATRIUM             Index        RIGHT ATRIUM           Index LA diam:        3.20 cm 1.88 cm/m   RA Area:     11.00 cm LA Vol (A2C):   45.7 ml 26.89 ml/m  RA  Volume:   22.10 ml  13.00 ml/m LA Vol (A4C):   36.7 ml 21.59 ml/m LA Biplane Vol: 41.9 ml 24.65 ml/m  AORTIC VALVE                    PULMONIC VALVE AV Area (Vmax):    1.95 cm     PV Vmax:          1.14 m/s AV Area (Vmean):   2.19 cm     PV Peak grad:     5.2 mmHg AV Area (VTI):     1.88 cm  PR End Diast Vel: 3.54 msec AV Vmax:           166.00 cm/s AV Vmean:          98.500 cm/s AV VTI:            0.379 m AV Peak Grad:      11.0 mmHg AV Mean Grad:      5.0 mmHg LVOT Vmax:         138.00 cm/s LVOT Vmean:        91.800 cm/s LVOT VTI:          0.303 m LVOT/AV VTI ratio: 0.80  AORTA Ao Root diam: 2.60 cm Ao Asc diam:  2.00 cm MITRAL VALVE MV Area (PHT): 3.55 cm     SHUNTS MV Peak grad:  6.6 mmHg     Systemic VTI:  0.30 m MV Mean grad:  3.0 mmHg     Systemic Diam: 1.73 cm MV Vmax:       1.28 m/s MV Vmean:      72.8 cm/s MR Peak grad: 53.3 mmHg MR Vmax:      365.00 cm/s MV E velocity: 90.70 cm/s MV A velocity: 126.00 cm/s MV E/A ratio:  0.72 Mihai Croitoru MD Electronically signed by Jerel Balding MD Signature Date/Time: 03/05/2024/2:41:53 PM    Final    DG Chest Portable 1 View Result Date: 03/04/2024 CLINICAL DATA:  Shortness of breath EXAM: PORTABLE CHEST 1 VIEW COMPARISON:  03/01/2024, chest CT 11/10/2023 FINDINGS: The heart size and mediastinal contours are within normal limits. Both lungs are clear. The visualized skeletal structures are unremarkable. IMPRESSION: No active disease. Electronically Signed   By: Luke Bun M.D.   On: 03/04/2024 17:41     Assessment/Plan:  62 yr presenting with hypoglycemia, hypotesnion and dizziness  MRSA bacteremia -low bioburden- Time to positivity 17 hrs- also staph epidermidis in the bottle which is a contaminant  Source unclear She has back pain- MRI of the lumbar and thoracic spine done Pt is currently on daptomycin Repeat Blood culture sent before starting antibiotic Ng so far 2 d echo  Abdominal pain and diarrhea- cdiff test pending Abdominal  pain has resolved CT abdomen/pelvis done  CKD   DM was getting insulin  at home Recent hypoglycemia  Legally blind due to diabetes   Discussed the management with the patient and ID pharmacist and hospitalist

## 2024-03-06 DIAGNOSIS — M549 Dorsalgia, unspecified: Secondary | ICD-10-CM | POA: Diagnosis not present

## 2024-03-06 DIAGNOSIS — R7881 Bacteremia: Secondary | ICD-10-CM | POA: Diagnosis not present

## 2024-03-06 DIAGNOSIS — B958 Unspecified staphylococcus as the cause of diseases classified elsewhere: Secondary | ICD-10-CM | POA: Diagnosis not present

## 2024-03-06 DIAGNOSIS — B9562 Methicillin resistant Staphylococcus aureus infection as the cause of diseases classified elsewhere: Secondary | ICD-10-CM | POA: Diagnosis not present

## 2024-03-06 LAB — CBC
HCT: 41.3 % (ref 36.0–46.0)
Hemoglobin: 12.8 g/dL (ref 12.0–15.0)
MCH: 26.3 pg (ref 26.0–34.0)
MCHC: 31 g/dL (ref 30.0–36.0)
MCV: 84.8 fL (ref 80.0–100.0)
Platelets: 149 K/uL — ABNORMAL LOW (ref 150–400)
RBC: 4.87 MIL/uL (ref 3.87–5.11)
RDW: 15.7 % — ABNORMAL HIGH (ref 11.5–15.5)
WBC: 6.7 K/uL (ref 4.0–10.5)
nRBC: 0 % (ref 0.0–0.2)

## 2024-03-06 LAB — BASIC METABOLIC PANEL WITH GFR
Anion gap: 11 (ref 5–15)
BUN: 24 mg/dL — ABNORMAL HIGH (ref 8–23)
CO2: 23 mmol/L (ref 22–32)
Calcium: 9.3 mg/dL (ref 8.9–10.3)
Chloride: 107 mmol/L (ref 98–111)
Creatinine, Ser: 1.61 mg/dL — ABNORMAL HIGH (ref 0.44–1.00)
GFR, Estimated: 35 mL/min — ABNORMAL LOW (ref >60)
Glucose, Bld: 231 mg/dL — ABNORMAL HIGH (ref 70–99)
Potassium: 4.1 mmol/L (ref 3.5–5.1)
Sodium: 142 mmol/L (ref 135–145)

## 2024-03-06 LAB — CULTURE, BLOOD (ROUTINE X 2): Culture: NO GROWTH

## 2024-03-06 LAB — GLUCOSE, CAPILLARY
Glucose-Capillary: 242 mg/dL — ABNORMAL HIGH (ref 70–99)
Glucose-Capillary: 267 mg/dL — ABNORMAL HIGH (ref 70–99)
Glucose-Capillary: 289 mg/dL — ABNORMAL HIGH (ref 70–99)
Glucose-Capillary: 242 mg/dL — ABNORMAL HIGH (ref 70–99)

## 2024-03-06 LAB — MRSA NEXT GEN BY PCR, NASAL: MRSA by PCR Next Gen: DETECTED — AB

## 2024-03-06 MED ORDER — INSULIN GLARGINE-YFGN 100 UNIT/ML ~~LOC~~ SOLN
5.0000 [IU] | Freq: Every day | SUBCUTANEOUS | Status: DC
  Administered 2024-03-06 – 2024-03-07 (×2): 5 [IU] via SUBCUTANEOUS
  Filled 2024-03-06 (×2): qty 0.05

## 2024-03-06 NOTE — Plan of Care (Signed)

## 2024-03-06 NOTE — Progress Notes (Signed)
 Date of Admission:  03/04/2024     ID: Amy [name] is a 63 y.o. female  Principal Problem:   MRSA bacteremia Active Problems:   DM type 1, not at goal, causing eye disease (HCC)   Hypothyroidism   Hypernatremia   Type 2 diabetes mellitus with complication, with long-term current use of insulin  (HCC)   Hyperlipidemia LDL goal <70   Spinal stenosis, multilevel   Essential hypertension   Chronic diastolic heart failure (HCC)   Obstructive sleep apnea on CPAP  Pt had MRI of the spine and CT abdomen today  Subjective: Patient is feeling better Back pain is improved Pain thigh is better Medications:   allopurinol   300 mg Oral BID   aspirin  EC  81 mg Oral Daily   celecoxib  200 mg Oral BID   dapagliflozin  propanediol  10 mg Oral Daily   enoxaparin  (LOVENOX ) injection  40 mg Subcutaneous Q24H   fluticasone   2 spray Each Nare Daily   gabapentin   400 mg Oral BID   insulin  aspart  0-9 Units Subcutaneous TID WC   insulin  glargine-yfgn  5 Units Subcutaneous Daily   levothyroxine   150 mcg Oral Daily   pantoprazole   40 mg Oral Daily    Objective: Vital signs in last 24 hours: Patient Vitals for the past 24 hrs:  BP Temp Pulse Resp SpO2 Weight  03/06/24 0545 (!) 148/66 99 F (37.2 C) (!) 58 16 98 % 75.2 kg  03/05/24 2310 (!) 116/56 98.5 F (36.9 C) 62 20 97 % --  03/05/24 2300 113/60 -- 78 -- 99 % --  03/05/24 1157 113/60 98.8 F (37.1 C) 73 15 99 % --     PHYSICAL EXAM:  General: Alert, cooperative, no distress, . Legally blind Lungs: Clear to auscultation bilaterally. No Wheezing or Rhonchi. No rales. Heart: Regular rate and rhythm, no murmur, rub or gallop. Abdomen: Soft, non-tender,not distended. Bowel sounds normal. No masses Extremities: atraumatic, no cyanosis. No edema. No clubbing Skin: No rashes or lesions. Or bruising Lymph: Cervical, supraclavicular normal. Tenderness thigh muscles on palpation Neurologic: Grossly non-focal  Lab Results    Latest Ref  Rng & Units 03/06/2024    6:03 AM 03/05/2024    5:25 AM 03/04/2024    2:24 PM  CBC  WBC 4.0 - 10.5 K/uL 6.7  7.3  8.3   Hemoglobin 12.0 - 15.0 g/dL 87.1  86.5  86.2   Hematocrit 36.0 - 46.0 % 41.3  42.9  43.9   Platelets 150 - 400 K/uL 149  157  159        Latest Ref Rng & Units 03/06/2024    6:03 AM 03/05/2024    5:25 AM 03/04/2024    2:24 PM  CMP  Glucose 70 - 99 mg/dL 768  798  877   BUN 8 - 23 mg/dL 24  21  25    Creatinine 0.44 - 1.00 mg/dL 8.38  8.67  8.49   Sodium 135 - 145 mmol/L 142  145  146   Potassium 3.5 - 5.1 mmol/L 4.1  4.0  3.8   Chloride 98 - 111 mmol/L 107  109  109   CO2 22 - 32 mmol/L 23  23  27    Calcium  8.9 - 10.3 mg/dL 9.3  9.6  9.9       Microbiology: 03/02/24 BC 1 of 2 set- MRSA and staph capitis Studies/Results: CT ABDOMEN PELVIS WO CONTRAST Result Date: 03/05/2024 CLINICAL DATA:  Bacteremia EXAM: CT ABDOMEN  AND PELVIS WITHOUT CONTRAST TECHNIQUE: Multidetector CT imaging of the abdomen and pelvis was performed following the standard protocol without IV contrast. RADIATION DOSE REDUCTION: This exam was performed according to the departmental dose-optimization program which includes automated exposure control, adjustment of the mA and/or kV according to patient size and/or use of iterative reconstruction technique. COMPARISON:  11/23/2019 FINDINGS: Lower chest: No acute abnormality. Hepatobiliary: No focal liver abnormality is seen. Status post cholecystectomy. No biliary dilatation. Pancreas: Unremarkable. No pancreatic ductal dilatation or surrounding inflammatory changes. Spleen: Normal in size without focal abnormality. Adrenals/Urinary Tract: Adrenal glands are within normal limits. Kidneys are well visualized bilaterally. No renal calculi or obstructive changes are seen. A few small hyperdense cysts are noted within the left kidney. No further follow-up is recommended. The bladder is partially distended. Bladder wall thickening is noted which may be  distension. Correlate with laboratory values. Stomach/Bowel: No obstructive or inflammatory changes of the colon are noted. The appendix is within normal limits. Small bowel and stomach are unremarkable. Vascular/Lymphatic: Aortic atherosclerosis. No enlarged abdominal or pelvic lymph nodes. Reproductive: Status post hysterectomy. No adnexal masses. Other: No abdominal wall hernia or abnormality. No abdominopelvic ascites. Musculoskeletal: Degenerative changes of lumbar spine are seen. No acute abnormality is noted. IMPRESSION: Mild bladder wall thickening. This may be related incomplete distension although the possibility of underlying UTI deserves consideration correlate with laboratory values. No other focal abnormality is seen. Electronically Signed   By: Oneil Devonshire M.D.   On: 03/05/2024 20:19   MR THORACIC SPINE W WO CONTRAST Result Date: 03/05/2024 EXAM: MRI THORACIC AND LUMBAR SPINE WITH AND WITHOUT INTRAVENOUS CONTRAST 03/05/2024 03:33:47 PM TECHNIQUE: Multiplanar multisequence MRI of the thoracic and lumbar spine was performed with and without the administration of intravenous contrast. 7 mL of gadobutrol (GADAVIST) 1 MMOL/ML injection was administered. COMPARISON: Lumbar spine MRI dated 06/30/2017. CLINICAL HISTORY: Bacteremia, rule out discitis. FINDINGS: BONES AND ALIGNMENT: Normal alignment. Normal vertebral body heights. Bone marrow signal is unremarkable. No abnormal enhancement. There is no evidence of discitis or osteomyelitis. SPINAL CORD: Normal spinal cord size. Normal spinal cord signal. SOFT TISSUES: Unremarkable. THORACIC DISC LEVELS: No significant disc herniation. No spinal canal stenosis or neural foraminal narrowing. LUMBAR DISC LEVELS: L1-L2: No significant disc herniation. No spinal canal stenosis or neural foraminal narrowing. L2-L3: No significant disc herniation. No spinal canal stenosis or neural foraminal narrowing. L3-L4: No significant disc herniation. No spinal canal  stenosis or neural foraminal narrowing. L4-L5: Small disc bulge without spinal canal or neural foraminal stenosis. L5-S1: Mild facet hypertrophy. No spinal canal or neural foraminal stenosis. IMPRESSION: 1. No evidence of discitis or osteomyelitis. Electronically signed by: Franky Stanford MD 03/05/2024 08:14 PM EDT RP Workstation: [id number]   MR Lumbar Spine W Wo Contrast Result Date: 03/05/2024 EXAM: MRI THORACIC AND LUMBAR SPINE WITH AND WITHOUT INTRAVENOUS CONTRAST 03/05/2024 03:33:47 PM TECHNIQUE: Multiplanar multisequence MRI of the thoracic and lumbar spine was performed with and without the administration of intravenous contrast. 7 mL of gadobutrol (GADAVIST) 1 MMOL/ML injection was administered. COMPARISON: Lumbar spine MRI dated 06/30/2017. CLINICAL HISTORY: Bacteremia, rule out discitis. FINDINGS: BONES AND ALIGNMENT: Normal alignment. Normal vertebral body heights. Bone marrow signal is unremarkable. No abnormal enhancement. There is no evidence of discitis or osteomyelitis. SPINAL CORD: Normal spinal cord size. Normal spinal cord signal. SOFT TISSUES: Unremarkable. THORACIC DISC LEVELS: No significant disc herniation. No spinal canal stenosis or neural foraminal narrowing. LUMBAR DISC LEVELS: L1-L2: No significant disc herniation. No spinal canal stenosis or neural foraminal  narrowing. L2-L3: No significant disc herniation. No spinal canal stenosis or neural foraminal narrowing. L3-L4: No significant disc herniation. No spinal canal stenosis or neural foraminal narrowing. L4-L5: Small disc bulge without spinal canal or neural foraminal stenosis. L5-S1: Mild facet hypertrophy. No spinal canal or neural foraminal stenosis. IMPRESSION: 1. No evidence of discitis or osteomyelitis. Electronically signed by: Franky Stanford MD 03/05/2024 08:14 PM EDT RP Workstation: [id number]   ECHOCARDIOGRAM COMPLETE Result Date: 03/05/2024    ECHOCARDIOGRAM REPORT   Patient Name:   Amy [name] Date of Exam:  03/05/2024 Medical Rec #:  [id number]      Height:       59.0 in Accession #:    [id number]     Weight:       165.0 lb Date of Birth:  April 29, 1961     BSA:          1.700 m Patient Age:    62 years       BP:           92/64 mmHg Patient Gender: F              HR:           62 bpm. Exam Location:  Inpatient Procedure: 2D Echo, Cardiac Doppler and Color Doppler (Both Spectral and Color            Flow Doppler were utilized during procedure). Indications:    Bacteremia R78.81  History:        Patient has prior history of Echocardiogram examinations, most                 recent 11/16/2022. CHF, COPD, Signs/Symptoms:Shortness of Breath;                 Risk Factors:Diabetes. H/O Hyperlipidemia, weakness.  Sonographer:    BERNARDA ROCKS Referring Phys: [id number] RAMESH KC IMPRESSIONS  1. Left ventricular ejection fraction, by estimation, is 60 to 65%. The left ventricle has normal function. The left ventricle has no regional wall motion abnormalities. Left ventricular diastolic parameters are consistent with Grade I diastolic dysfunction (impaired relaxation).  2. Right ventricular systolic function is normal. The right ventricular size is normal.  3. The mitral valve is normal in structure. No evidence of mitral valve regurgitation. No evidence of mitral stenosis.  4. The aortic valve is normal in structure. Aortic valve regurgitation is not visualized. No aortic stenosis is present.  5. The inferior vena cava is normal in size with greater than 50% respiratory variability, suggesting right atrial pressure of 3 mmHg. Comparison(s): No significant change from prior study. Prior images reviewed side by side. Conclusion(s)/Recommendation(s): No evidence of valvular vegetations on this transthoracic echocardiogram. Consider a transesophageal echocardiogram to exclude infective endocarditis if clinically indicated. FINDINGS  Left Ventricle: Left ventricular ejection fraction, by estimation, is 60 to 65%. The left ventricle has  normal function. The left ventricle has no regional wall motion abnormalities. The left ventricular internal cavity size was normal in size. There is  no left ventricular hypertrophy. Left ventricular diastolic parameters are consistent with Grade I diastolic dysfunction (impaired relaxation). Right Ventricle: The right ventricular size is normal. No increase in right ventricular wall thickness. Right ventricular systolic function is normal. Left Atrium: Left atrial size was normal in size. Right Atrium: Right atrial size was normal in size. Pericardium: There is no evidence of pericardial effusion. Mitral Valve: The mitral valve is normal in structure. No evidence of mitral valve regurgitation. No evidence of mitral  valve stenosis. MV peak gradient, 6.6 mmHg. The mean mitral valve gradient is 3.0 mmHg. Tricuspid Valve: The tricuspid valve is normal in structure. Tricuspid valve regurgitation is not demonstrated. No evidence of tricuspid stenosis. Aortic Valve: The aortic valve is normal in structure. Aortic valve regurgitation is not visualized. No aortic stenosis is present. Aortic valve mean gradient measures 5.0 mmHg. Aortic valve peak gradient measures 11.0 mmHg. Aortic valve area, by VTI measures 1.88 cm. Pulmonic Valve: The pulmonic valve was normal in structure. Pulmonic valve regurgitation is trivial. No evidence of pulmonic stenosis. Aorta: The aortic root is normal in size and structure. Venous: The inferior vena cava is normal in size with greater than 50% respiratory variability, suggesting right atrial pressure of 3 mmHg. IAS/Shunts: No atrial level shunt detected by color flow Doppler.  LEFT VENTRICLE PLAX 2D LVIDd:         4.00 cm     Diastology LVIDs:         2.20 cm     LV e' medial:    6.00 cm/s LV PW:         0.80 cm     LV E/e' medial:  15.1 LV IVS:        0.90 cm     LV e' lateral:   8.05 cm/s LVOT diam:     1.73 cm     LV E/e' lateral: 11.3 LV SV:         71 LV SV Index:   42 LVOT Area:      2.35 cm  LV Volumes (MOD) LV vol d, MOD A2C: 60.2 ml LV vol d, MOD A4C: 75.1 ml LV vol s, MOD A2C: 20.7 ml LV vol s, MOD A4C: 18.7 ml LV SV MOD A2C:     39.5 ml LV SV MOD A4C:     75.1 ml LV SV MOD BP:      47.9 ml RIGHT VENTRICLE             IVC RV Basal diam:  3.20 cm     IVC diam: 1.30 cm RV S prime:     13.30 cm/s TAPSE (M-mode): 1.9 cm LEFT ATRIUM             Index        RIGHT ATRIUM           Index LA diam:        3.20 cm 1.88 cm/m   RA Area:     11.00 cm LA Vol (A2C):   45.7 ml 26.89 ml/m  RA Volume:   22.10 ml  13.00 ml/m LA Vol (A4C):   36.7 ml 21.59 ml/m LA Biplane Vol: 41.9 ml 24.65 ml/m  AORTIC VALVE                    PULMONIC VALVE AV Area (Vmax):    1.95 cm     PV Vmax:          1.14 m/s AV Area (Vmean):   2.19 cm     PV Peak grad:     5.2 mmHg AV Area (VTI):     1.88 cm     PR End Diast Vel: 3.54 msec AV Vmax:           166.00 cm/s AV Vmean:          98.500 cm/s AV VTI:            0.379 m AV Peak Grad:  11.0 mmHg AV Mean Grad:      5.0 mmHg LVOT Vmax:         138.00 cm/s LVOT Vmean:        91.800 cm/s LVOT VTI:          0.303 m LVOT/AV VTI ratio: 0.80  AORTA Ao Root diam: 2.60 cm Ao Asc diam:  2.00 cm MITRAL VALVE MV Area (PHT): 3.55 cm     SHUNTS MV Peak grad:  6.6 mmHg     Systemic VTI:  0.30 m MV Mean grad:  3.0 mmHg     Systemic Diam: 1.73 cm MV Vmax:       1.28 m/s MV Vmean:      72.8 cm/s MR Peak grad: 53.3 mmHg MR Vmax:      365.00 cm/s MV E velocity: 90.70 cm/s MV A velocity: 126.00 cm/s MV E/A ratio:  0.72 Mihai Croitoru MD Electronically signed by Jerel Balding MD Signature Date/Time: 03/05/2024/2:41:53 PM    Final    DG Chest Portable 1 View Result Date: 03/04/2024 CLINICAL DATA:  Shortness of breath EXAM: PORTABLE CHEST 1 VIEW COMPARISON:  03/01/2024, chest CT 11/10/2023 FINDINGS: The heart size and mediastinal contours are within normal limits. Both lungs are clear. The visualized skeletal structures are unremarkable. IMPRESSION: No active disease. Electronically  Signed   By: Luke Bun M.D.   On: 03/04/2024 17:41     Assessment/Plan:  62 yr presenting with hypoglycemia, hypotesnion and dizziness  MRSA bacteremia -low bioburden- Time to positivity 17 hrs- also staph epidermidis in the bottle which is a contaminant Repeat Blood culture sent before starting antibiotic is negative Source unclear She has back pain- MRI of the lumbar and thoracic spine no discitis or osteo Pt is currently on daptomycin 2 d echo valves look fine  No need for TEE Will do p.o. linezolid on discharge until 03/17/24 While on linezolid we need to monitor her CBC weekly l to look for any thrombocytopenia or leukopenia or hemoglobin dropping Pharmacist to discuss food interactions with linezolid and medication  to avoid with linezolid. Will see her in my clinic next week.  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

## 2024-03-06 NOTE — Progress Notes (Signed)
 PROGRESS NOTE Amy [name]  FMW:[id number] DOB: [birth year] DOA: 03/04/2024 PCP: Sigrid Deidra Fox, MD  Brief Narrative/Hospital Course: Amy [name] is a 63 y.o. female with PMH of chronic diastolic CHF, HTN, T2DM with legal blindness, sleep apnea CKD stage IIIa, presented to the ED positive blood culture.  Patient was initially seen in the ED 10/17 due to nausea dizziness weakness hypotension, low blood sugar-after initial workup in the ED including normal lactic acid chest x-ray discharged home after iv fluids, and  blood culture taht grew MRSA as well as staph epidermidis attempts were made to call her and finally she presented to the EDShe has been complaining of mild shortness of breath, increased productive cough but no abdominal pain fever nausea vomiting diarrhea She stays cold all the time. She has right thigh pain. Patient otherwise denies any nausea, vomiting, chest pain, shortness of breath, fever, chills, headache, focal weakness, numbness tingling, speech difficulties , running nose. In the ED: VSS, labs with creatinine 1.5 baseline hyponatremia 146 normal CBC.  Normal CK COVID RSV flu pending UA pending blood culture ordered and admission was requested Daptomycin was started after discussion with ID  Subjective: Seen and examined Resting comfortably without nausea vomiting fever chills Overnight remains afebrile vital stable Labs creatinine up 1.6 hyperglycemia 230s   Assessment and plan:  MRSA bacteremia: Source unclear, presented w/ hypotension, hypoglycemia and feeling unwell for few days ago In ED when initial blood culture was obtained and patient discharged Blood culture turned out to be positive with staph epi and MRSA. Echo shows EF 60 to 65% G1 DD MVP is normal aortic valve is normal CT abdomen pelvis w/o- nad, along with MRI thoracic and lumbar spine with and without contrast no discitis ID following closely. 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  Cont to monitor Recent Labs    09/20/23 2151 11/09/23 1319 11/10/23 0105 11/10/23 0106 03/01/24 2045 03/01/24 2049 03/04/24 1424 03/05/24 0525 03/06/24 0603  BUN 28* 25*  --  23 23 20  25* 21 24*  CREATININE 1.28* 1.84* 1.68* 1.65* 1.90* 1.60* 1.50* 1.32* 1.61*  CO2 27 25  --  24  --  25 27 23 23   K 4.5 4.2  --  4.8 3.5 3.7 3.8 4.0 4.1    Hypertension: BP stable-cont to hold losartan due to recent hypotension  Hypothyroidism: Continue Synthroid .  GERD: Continue PPI  HLD Statin.  OSA: continue CPAP bedtime  Gout: Continue allopurinol .  Legal blindness Son lives with here. She manages her own insulin  by hearing the  click  Chronic diastolic heart failure: Appears euvolemic.   Class I Obesity w/ Body mass index is 33.48 kg/m.: Will benefit with PCP follow-up, weight loss,healthy lifestyle  DVT prophylaxis: enoxaparin  (LOVENOX ) injection 40 mg Start: 03/04/24 2200 Code Status:   Code Status: Full Code  Level of care: Telemetry  Dispo: The patient is from: home            Anticipated disposition: TBD Objective: Vitals last 24 hrs: Vitals:   03/05/24 1157 03/05/24 2300 03/05/24 2310 03/06/24 0545  BP: 113/60 113/60 (!) 116/56 (!) 148/66  Pulse: 73 78 62 (!) 58  Resp:  15  20 16   Temp: 98.8 F (37.1 C)  98.5 F (36.9 C) 99 F (37.2 C)  TempSrc:      SpO2: 99% 99% 97% 98%  Weight:    75.2 kg  Height:        Physical Examination: General exam: aaox3,pleasant, legally blind HEENT:Oral mucosa moist, Ear/Nose WNL grossly Respiratory system: CTA bilaterally Cardiovascular system: S1 & S2 +,  No JVD. Gastrointestinal system: Abdomen soft,NT,ND, BS+ Nervous System: Alert, awake, moving all extremities,and following commands. Extremities: extremities warm, leg edema neg Skin: Warm, no rashes MSK: Normal muscle bulk,tone, power   Medications reviewed:  Scheduled Meds:  allopurinol   300 mg Oral BID   aspirin  EC  81 mg Oral Daily   celecoxib  200 mg Oral BID   dapagliflozin  propanediol  10 mg Oral Daily   enoxaparin  (LOVENOX ) injection  40 mg Subcutaneous Q24H   fluticasone   2 spray Each Nare Daily   gabapentin   400 mg Oral BID   insulin  aspart  0-9 Units Subcutaneous TID WC   insulin  glargine-yfgn  5 Units Subcutaneous Daily   levothyroxine   150 mcg Oral Daily   pantoprazole   40 mg Oral Daily   Continuous Infusions:  DAPTOmycin     Diet: Diet Order             Diet Carb Modified Fluid consistency: Thin; Room service appropriate? Yes  Diet effective now                    Data Reviewed: I have personally reviewed following labs and imaging studies ( see epic result tab) CBC: Recent Labs  Lab 03/01/24 2045 03/01/24 2049 03/04/24 1424 03/05/24 0525 03/06/24 0603  WBC  --  7.4 8.3 7.3 6.7  NEUTROABS  --   --  5.6  --   --   HGB 12.9 12.5 13.7 13.4 12.8  HCT 38.0 40.8 43.9 42.9 41.3  MCV  --  84.1 83.1 85.1 84.8  PLT  --  153 159 157 149*   CMP: Recent Labs  Lab 03/01/24 2045 03/01/24 2049 03/04/24 1424 03/05/24 0525 03/06/24 0603  NA 148* 147* 146* 145 142  K 3.5 3.7 3.8 4.0 4.1  CL 109 112* 109 109 107  CO2  --  25 27 23 23   GLUCOSE 31* 32* 122* 201* 231*  BUN 23 20 25* 21 24*  CREATININE 1.90* 1.60* 1.50* 1.32* 1.61*  CALCIUM   --  9.5 9.9 9.6 9.3   GFR: Estimated Creatinine Clearance: 31.2 mL/min (A) (by C-G formula based on SCr of 1.61 mg/dL (H)). Recent Labs  Lab 03/01/24 2049  AST 27  ALT 8  ALKPHOS 80  BILITOT 0.3  PROT 5.8*  ALBUMIN  3.2*   No results for input(s): LIPASE, AMYLASE in the last 168 hours. No results for  input(s): AMMONIA in the last 168 hours. Coagulation Profile: No results for input(s): INR, PROTIME in the last 168 hours. Unresulted Labs (From admission, onward)     Start     Ordered   03/11/24 0500  Creatinine, serum  (enoxaparin  (LOVENOX )    CrCl >/= 30 ml/min)  Weekly,   R     Comments: while on enoxaparin  therapy    03/04/24 1731   03/05/24 0500  Basic metabolic panel with GFR  Daily,   R      03/04/24 1731   03/05/24 0500  CBC  Daily,   R      03/04/24 1731   03/04/24  1629  CK  Weekly,   R      03/04/24 1629           Antimicrobials/Microbiology: Anti-infectives (From admission, onward)    Start     Dose/Rate Route Frequency Ordered Stop   03/06/24 1400  DAPTOmycin (CUBICIN) IVPB 500 mg/61mL premix        8 mg/kg  57 kg (Order-Specific) 100 mL/hr over 30 Minutes Intravenous Daily 03/05/24 1223     03/05/24 0805  DAPTOmycin (CUBICIN) IVPB 500 mg/50mL premix  Status:  Discontinued        8 mg/kg  57 kg (Order-Specific) 100 mL/hr over 30 Minutes Intravenous Every 48 hours 03/05/24 0805 03/05/24 1224   03/04/24 1630  DAPTOmycin (CUBICIN) 600 mg in sodium chloride  0.9 % IVPB  Status:  Discontinued        8 mg/kg  74.8 kg 124 mL/hr over 30 Minutes Intravenous Daily 03/04/24 1629 03/05/24 0805         Component Value Date/Time   SDES  03/04/2024 1500    BLOOD RIGHT FOREARM Performed at [facility], [address] [place], [place] [address]    SPECREQUEST  03/04/2024 1500    BOTTLES DRAWN AEROBIC AND ANAEROBIC Blood Culture results may not be optimal due to an inadequate volume of blood received in culture bottles Performed at [facility] [address], [place], [place] [address]    CULT  03/04/2024 1500    NO GROWTH 2 DAYS Performed at [facility], [address], [place], [place] [address]    REPTSTATUS PENDING 03/04/2024 1500    Procedures:    Amy LAMY, MD [facility] 03/06/2024, 10:30 AM

## 2024-03-06 NOTE — Progress Notes (Signed)
   03/06/24 2300  BiPAP/CPAP/SIPAP  BiPAP/CPAP/SIPAP Pt Type Adult  Reason BIPAP/CPAP not in use Non-compliant (pt is going to have someone from home bring her CPAP can't tolerate ours.)  BiPAP/CPAP /SiPAP Vitals  Pulse Rate 66  Resp 18  SpO2 96 %  MEWS Score/Color  MEWS Score 1  MEWS Score Color Green

## 2024-03-06 NOTE — TOC Initial Note (Addendum)
 Transition of Care [facility]) - Initial/Assessment Note    Patient Details  Name: Amy [name] MRN: [id number] Date of Birth: 03-18-1961  Transition of Care [facility]) CM/SW Contact:    Sheri ONEIDA Sharps, LCSW Phone Number: 03/06/2024, 1:21 PM  Clinical Narrative:                 Pt from home w/ adult son. Pt continues medical workup. Recommended DME rollator and 3n1. Pt requested that DME be delivered to home. DME ordered via [employer] to be delivered to home. Pt states that her sister might be able to provide transportation at dc, if not pt will require PTAR to get home. Pt also recommended for HHOT, HH setup w/ [facility]. HH information added to AVS. ICM following for additional dc needs.     Barriers to Discharge: Continued Medical Work up   Patient Goals and CMS Choice Patient states their goals for this hospitalization and ongoing recovery are:: return home          Expected Discharge Plan and Services In-house Referral: NA Discharge Planning Services: NA   Living arrangements for the past 2 months: Apartment                 DME Arranged: 3-N-1, Walker rolling with seat DME Agency: [employer] Date DME Agency Contacted: 03/06/24 Time DME Agency Contacted: 0900 Representative spoke with at DME Agency: [place]            Prior Living Arrangements/Services Living arrangements for the past 2 months: Apartment Lives with:: Adult Children Patient language and need for interpreter reviewed:: Yes Do you feel safe going back to the place where you live?: Yes      Need for Family Participation in Patient Care: Yes (Comment) Care giver support system in place?: Yes (comment)   Criminal Activity/Legal Involvement Pertinent to Current Situation/Hospitalization: No - Comment as needed  Activities of Daily Living   ADL Screening (condition at time of admission) Independently performs ADLs?: No Does the patient have a NEW difficulty with  bathing/dressing/toileting/self-feeding that is expected to last >3 days?: Yes (Initiates electronic notice to provider for possible OT consult) Does the patient have a NEW difficulty with getting in/out of bed, walking, or climbing stairs that is expected to last >3 days?: No Does the patient have a NEW difficulty with communication that is expected to last >3 days?: No Is the patient deaf or have difficulty hearing?: No Does the patient have difficulty seeing, even when wearing glasses/contacts?: Yes Does the patient have difficulty concentrating, remembering, or making decisions?: No  Permission Sought/Granted                  Emotional Assessment Appearance:: Appears stated age Attitude/Demeanor/Rapport: Engaged Affect (typically observed): Accepting Orientation: : Oriented to Self, Oriented to Place, Oriented to  Time, Oriented to Situation Alcohol / Substance Use: Not Applicable Psych Involvement: No (comment)  Admission diagnosis:  Positive blood cultures [R78.81] MRSA bacteremia [R78.81, B95.62] Patient Active Problem List   Diagnosis Date Noted   MRSA bacteremia 03/04/2024   ARF (acute renal failure) 11/10/2023   Abnormal breathing 03/07/2023   Nasal dryness 06/22/2021   Dysphagia 04/07/2021   Gastro-esophageal reflux disease without esophagitis 04/07/2021   History of colonic polyps 04/07/2021   Bilateral impacted cerumen 02/25/2021   Sensorineural hearing loss (SNHL), bilateral 02/25/2021   Type 2 diabetes mellitus with diabetic polyneuropathy, with long-term current use of insulin  (HCC) 06/29/2020   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 06/29/2020   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, with long-term current use of insulin  (HCC) 06/29/2020   Type 2 diabetes mellitus with hyperglycemia, with long-term current use of insulin  (HCC) 06/29/2020   Uncontrolled type 1 diabetes mellitus 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 03/31/2020   Pre-operative cardiovascular examination 02/18/2019   Septic bursitis of elbow, right 10/30/2018   CRI (chronic renal insufficiency), stage 3 (moderate) 07/26/2018   Morbid obesity (HCC) 07/26/2018   Intractable nausea and vomiting 03/06/2018   Right leg pain 12/19/2017   Leukocytosis 12/19/2017   Benign positional vertigo 12/19/2017   Weakness 12/12/2017   Laryngopharyngeal reflux (LPR) 10/19/2017   Post-nasal drainage 10/19/2017   Acute sinusitis 10/19/2017   Degeneration of lumbar intervertebral disc 07/29/2017   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, left 03/08/2017   Obstructive sleep apnea on CPAP 02/09/2017   Chronic diastolic heart failure (HCC) 11/08/2016   Near syncope 01/30/2016   SOB (shortness of breath)    CAP (community acquired pneumonia) 09/12/2015   Hypoxia 09/12/2015   Essential hypertension 07/05/2015   Hypotension 07/05/2015   Diabetes mellitus with neurological manifestations (HCC) 11/04/2014   Hyperlipidemia LDL goal <70 11/04/2014   Spinal stenosis, multilevel 11/04/2014   Hyperkalemia 11/01/2014   Hypoglycemia 08/09/2014   Type 2 diabetes mellitus with complication, with long-term current use of insulin  (HCC) 08/08/2014   Elevated troponin 08/08/2014   Nausea vomiting and diarrhea 08/08/2014   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   Dizziness 08/06/2012   Orthostatic hypotension 08/06/2012   Hypernatremia 08/06/2012   Acute gastroenteritis 08/13/2011   Gastroparesis 08/13/2011   Low back pain 08/13/2011   Oral thrush 08/13/2011   Gastroenteritis 05/23/2011   Dehydration 05/23/2011   Blindness 05/23/2011   DM type 1, not at goal, causing eye disease (HCC) 05/23/2011   Asthma 05/23/2011   Diarrhea 05/23/2011   Vomiting 05/23/2011   Hypothyroidism 05/23/2011   Diabetic  neuropathy (HCC) 05/23/2011   Diabetic nephropathy (HCC) 05/23/2011   PCP:  Sigrid Deidra Fox, MD Pharmacy:   [facility] - [place], [place] - [address] [place] [address] Phone: [phone] Fax: [phone]     Social Drivers of Health (SDOH) Social History: SDOH Screenings   Food Insecurity: No Food Insecurity (03/04/2024)  Housing: Low Risk  (03/04/2024)  Transportation Needs: No Transportation Needs (03/04/2024)  Utilities: Not At Risk (03/04/2024)  Alcohol Screen: Low Risk  (05/29/2019)  Depression (PHQ2-9): Low Risk  (08/05/2020)  Financial Resource Strain: Medium Risk (10/02/2019)  Social Connections: Unknown (03/09/2023)   Received from [facility]  Tobacco Use: Medium Risk (03/04/2024)   SDOH Interventions:     Readmission Risk Interventions     No data to display

## 2024-03-06 NOTE — Plan of Care (Signed)
  Problem: Education: Goal: Ability to describe self-care measures that may prevent or decrease complications (Diabetes Survival Skills Education) will improve Outcome: Progressing Goal: Individualized Educational Video(s) Outcome: Progressing   Problem: Clinical Measurements: Goal: Ability to maintain clinical measurements within normal limits will improve Outcome: Progressing Goal: Will remain free from infection Outcome: Progressing Goal: Cardiovascular complication will be avoided Outcome: Progressing   Problem: Coping: Goal: Level of anxiety will decrease Outcome: Progressing   Problem: Pain Managment: Goal: General experience of comfort will improve and/or be controlled Outcome: Progressing   Problem: Skin Integrity: Goal: Risk for impaired skin integrity will decrease Outcome: Progressing

## 2024-03-07 ENCOUNTER — Other Ambulatory Visit (HOSPITAL_COMMUNITY): Payer: Self-pay

## 2024-03-07 ENCOUNTER — Telehealth (HOSPITAL_COMMUNITY): Payer: Self-pay | Admitting: Pharmacy Technician

## 2024-03-07 ENCOUNTER — Telehealth (HOSPITAL_BASED_OUTPATIENT_CLINIC_OR_DEPARTMENT_OTHER): Payer: Self-pay | Admitting: *Deleted

## 2024-03-07 DIAGNOSIS — R7881 Bacteremia: Secondary | ICD-10-CM | POA: Diagnosis not present

## 2024-03-07 DIAGNOSIS — B9562 Methicillin resistant Staphylococcus aureus infection as the cause of diseases classified elsewhere: Secondary | ICD-10-CM | POA: Diagnosis not present

## 2024-03-07 LAB — CBC
HCT: 39.6 % (ref 36.0–46.0)
Hemoglobin: 12.1 g/dL (ref 12.0–15.0)
MCH: 25.7 pg — ABNORMAL LOW (ref 26.0–34.0)
MCHC: 30.6 g/dL (ref 30.0–36.0)
MCV: 84.3 fL (ref 80.0–100.0)
Platelets: 145 K/uL — ABNORMAL LOW (ref 150–400)
RBC: 4.7 MIL/uL (ref 3.87–5.11)
RDW: 15.5 % (ref 11.5–15.5)
WBC: 6.9 K/uL (ref 4.0–10.5)
nRBC: 0 % (ref 0.0–0.2)

## 2024-03-07 LAB — GLUCOSE, CAPILLARY
Glucose-Capillary: 243 mg/dL — ABNORMAL HIGH (ref 70–99)
Glucose-Capillary: 209 mg/dL — ABNORMAL HIGH (ref 70–99)

## 2024-03-07 LAB — BASIC METABOLIC PANEL WITH GFR
Anion gap: 11 (ref 5–15)
BUN: 25 mg/dL — ABNORMAL HIGH (ref 8–23)
CO2: 23 mmol/L (ref 22–32)
Calcium: 9.2 mg/dL (ref 8.9–10.3)
Chloride: 111 mmol/L (ref 98–111)
Creatinine, Ser: 1.76 mg/dL — ABNORMAL HIGH (ref 0.44–1.00)
GFR, Estimated: 32 mL/min — ABNORMAL LOW (ref >60)
Glucose, Bld: 283 mg/dL — ABNORMAL HIGH (ref 70–99)
Potassium: 4.2 mmol/L (ref 3.5–5.1)
Sodium: 145 mmol/L (ref 135–145)

## 2024-03-07 MED ORDER — LINEZOLID 600 MG PO TABS
600.0000 mg | ORAL_TABLET | Freq: Two times a day (BID) | ORAL | Status: DC
  Administered 2024-03-07: 600 mg via ORAL
  Filled 2024-03-07: qty 1

## 2024-03-07 MED ORDER — LINEZOLID 600 MG PO TABS
600.0000 mg | ORAL_TABLET | Freq: Two times a day (BID) | ORAL | 0 refills | Status: AC
  Filled 2024-03-07: qty 24, 12d supply, fill #0

## 2024-03-07 MED ORDER — ALCOHOL SWABS PADS
MEDICATED_PAD | 0 refills | Status: AC
  Filled 2024-03-07: qty 100, 100d supply, fill #0

## 2024-03-07 NOTE — Telephone Encounter (Signed)
 Patient Product/process development scientist completed.    The patient is insured through [facility]. Patient has Medicare and is not eligible for a copay card, but may be able to apply for patient assistance or Medicare RX Payment Plan (Patient Must reach out to their plan, if eligible for payment plan), if available.    Ran test claim for linezolid 600 mg and the current 11 day co-pay is $0.00.   This test claim was processed through  [facility]- copay amounts may vary at other pharmacies due to pharmacy/plan contracts, or as the patient moves through the different stages of their insurance plan.     Reyes Sharps, CPHT Pharmacy Technician Patient Advocate Specialist Lead [facility] Patient Advocate Team Direct Number: [phone]  Fax: [phone]

## 2024-03-07 NOTE — Plan of Care (Signed)
  Problem: Education: Goal: Ability to describe self-care measures that may prevent or decrease complications (Diabetes Survival Skills Education) will improve Outcome: Progressing   Problem: Coping: Goal: Ability to adjust to condition or change in health will improve Outcome: Progressing   Problem: Fluid Volume: Goal: Ability to maintain a balanced intake and output will improve Outcome: Progressing   Problem: Nutritional: Goal: Maintenance of adequate nutrition will improve Outcome: Progressing   Problem: Activity: Goal: Risk for activity intolerance will decrease Outcome: Progressing   Problem: Nutrition: Goal: Adequate nutrition will be maintained Outcome: Progressing   Problem: Elimination: Goal: Will not experience complications related to urinary retention Outcome: Progressing

## 2024-03-07 NOTE — Telephone Encounter (Signed)
 Post ED Visit - Positive Culture Follow-up  Culture report reviewed by antimicrobial stewardship pharmacist: [facility] []  Rankin Dee, Pharm.D. []  Venetia Gully, Pharm.D., BCPS AQ-ID []  Garrel Crews, Pharm.D., BCPS []  Almarie Lunger, Pharm.D., BCPS []  Muir, [address].D., BCPS, AAHIVP []  Rosaline Bihari, Pharm.D., BCPS, AAHIVP []  Vernell Meier, PharmD, BCPS []  Latanya Hint, PharmD, BCPS []  Donald Medley, PharmD, BCPS []  Rocky Bold, PharmD []  Dorothyann Alert, PharmD, BCPS []  Morene Babe, PharmD  [facility] [x]  Pearley Satterfield PharmD []  Romona Bliss, PharmD []  Dolphus Roller, PharmD []  Veva Seip, Rph []  Vernell Daunt) Leonce, PharmD []  Eva Allis, PharmD []  Rosaline Millet, PharmD []  Iantha Batch, PharmD []  Arvin Gauss, PharmD []  Wanda Hasting, PharmD []  Ronal Rav, PharmD []  Rocky Slade, PharmD []  Bard Jeans, PharmD   Positive urine culture Treated with Cephalexin , organism sensitive to the same and no further patient follow-up is required at this time. Patient currently admitted.  Amy [name] 03/07/2024, 1:09 PM

## 2024-03-07 NOTE — Progress Notes (Signed)
 Discharge mediations delivered to patient at the bedside in a secure bag.

## 2024-03-07 NOTE — Discharge Summary (Signed)
 Physician Discharge Summary  Amy [name] FMW:[id number] DOB: [birth year] DOA: 03/04/2024  PCP: Sigrid Deidra Fox, MD  Admit date: 03/04/2024 Discharge date: 03/07/2024  Admitted From:  Discharge disposition: Home   Recommendations for Outpatient Follow-Up:   Antibiotics through 11/3-CBC weekly while on Zyvox   Discharge Diagnosis:   Principal Problem:   MRSA bacteremia Active Problems:   DM type 1, not at goal, causing eye disease (HCC)   Hypothyroidism   Hypernatremia   Type 2 diabetes mellitus with complication, with long-term current use of insulin  (HCC)   Hyperlipidemia LDL goal <70   Spinal stenosis, multilevel   Essential hypertension   Chronic diastolic heart failure (HCC)   Obstructive sleep apnea on CPAP    Discharge Condition: Improved.  Diet recommendation: Low sodium, heart healthy.  Carbohydrate-modified  Wound care: None.  Code status: Full.   History of Present Illness:   Amy [name] is a 63 y.o. female with PMH of chronic diastolic CHF, HTN, T2DM with legal blindness, sleep apnea CKD stage IIIa, presented to the ED positive blood culture.  Patient was initially seen in the ED 10/17 due to nausea dizziness weakness hypotension, low blood sugar-after initial workup in the ED including normal lactic acid chest x-ray discharged home after iv fluids, and  blood culture taht grew MRSA as well as staph epidermidis attempts were made to call her and finally she presented to the ED She has been complaining of mild shortness of breath, increased productive cough but no abdominal pain fever nausea vomiting diarrhea She stays cold all the time. She has right thigh pain. Patient otherwise denies any nausea, vomiting, chest pain, shortness of breath, fever, chills, headache, focal weakness, numbness tingling, speech difficulties , running nose.   In the ED: VSS, labs with creatinine 1.5 baseline hyponatremia 146 normal CBC.  Normal CK COVID RSV  flu pending UA pending blood culture ordered and admission was requested Daptomycin was started after discussion with ID     Hospital Course by Problem:   MRSA bacteremia: Source unclear, presented w/ hypotension, hypoglycemia and feeling unwell for few days ago In ED when initial blood culture was obtained and patient discharged-per patient she does not cleanse her skin with alcohol prior to insulin  injections as she is out Blood culture turned out to be positive with staph epi and MRSA. Echo shows EF 60 to 65% G1 DD MVP is normal aortic valve is normal CT abdomen pelvis w/o- nad, along with MRI thoracic and lumbar spine with and without contrast no discitis ID following closely. 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   GERD: Continue PPI   HLD Statin.   OSA: continue CPAP bedtime   Gout: Continue allopurinol .   Legal blindness Son lives with here. She manages her own insulin  by hearing the  click   Chronic diastolic heart failure: Appears euvolemic.   Class I Obesity w/ Body mass index is 33.48 kg/m.: Will benefit with PCP follow-up, weight loss,healthy lifestyle      Medical Consultants:  ID    Discharge Exam:   Vitals:   03/06/24 2300 03/07/24 0612  BP:  (!) 144/64  Pulse: 66 64  Resp: 18 18  Temp:  98.8 F (37.1 C)  SpO2: 96% 97%   Vitals:   03/06/24 1156 03/06/24 2214 03/06/24 2300 03/07/24 0612  BP: (!) 124/54 109/62  (!) 144/64  Pulse: 62 62 66 64  Resp: 18 18 18 18   Temp: 98.1 F (36.7 C) (!) 97.5 F (36.4 C)  98.8 F (37.1 C)  TempSrc:      SpO2: 97% 95% 96% 97%  Weight:      Height:        General exam:  Appears calm and comfortable   The results of significant diagnostics from this hospitalization (including imaging, microbiology, ancillary and laboratory) are listed below for reference.     Procedures and Diagnostic Studies:   CT ABDOMEN PELVIS WO CONTRAST Result Date: 03/05/2024 CLINICAL DATA:  Bacteremia EXAM: CT ABDOMEN AND PELVIS WITHOUT CONTRAST TECHNIQUE: Multidetector CT imaging of the abdomen and pelvis was performed following the standard protocol without IV contrast. RADIATION DOSE REDUCTION: This exam was performed according to the departmental dose-optimization program which includes automated exposure control, adjustment of the mA and/or kV according to patient size and/or use of iterative reconstruction technique. COMPARISON:  11/23/2019 FINDINGS: Lower chest: No acute abnormality. Hepatobiliary: No focal liver abnormality is seen. Status post cholecystectomy. No biliary dilatation. Pancreas: Unremarkable. No pancreatic ductal dilatation or surrounding inflammatory changes. Spleen: Normal in size without focal abnormality. Adrenals/Urinary Tract: Adrenal glands are within normal limits. Kidneys are well visualized bilaterally. No renal calculi or obstructive changes are seen. A few small hyperdense cysts are noted within the left kidney. No further follow-up is recommended. The bladder is partially distended. Bladder wall thickening is noted which may be distension. Correlate with laboratory values. Stomach/Bowel: No obstructive or inflammatory changes of the colon are noted. The appendix is within normal limits. Small bowel and stomach are unremarkable. Vascular/Lymphatic: Aortic atherosclerosis. No enlarged abdominal or pelvic lymph nodes. Reproductive: Status post hysterectomy. No adnexal masses. Other: No abdominal wall hernia or abnormality. No abdominopelvic ascites. Musculoskeletal: Degenerative changes of lumbar spine are seen. No acute abnormality is noted. IMPRESSION: Mild bladder  wall thickening. This may be related incomplete distension although the possibility of underlying UTI deserves consideration correlate with laboratory values. No other focal abnormality is seen. Electronically Signed   By: Oneil Devonshire M.D.   On: 03/05/2024 20:19   MR THORACIC SPINE W WO CONTRAST Result Date: 03/05/2024 EXAM: MRI THORACIC AND LUMBAR SPINE WITH AND WITHOUT INTRAVENOUS CONTRAST 03/05/2024 03:33:47 PM TECHNIQUE: Multiplanar multisequence MRI of the thoracic and lumbar spine was performed with and without the administration of intravenous contrast. 7 mL of gadobutrol (GADAVIST) 1 MMOL/ML injection was administered. COMPARISON: Lumbar spine MRI dated 06/30/2017. CLINICAL HISTORY: Bacteremia, rule out discitis. FINDINGS: BONES AND ALIGNMENT: Normal alignment. Normal vertebral body heights. Bone marrow signal is unremarkable. No abnormal enhancement. There is no evidence of discitis or osteomyelitis. SPINAL CORD: Normal spinal cord size. Normal spinal cord signal. SOFT TISSUES: Unremarkable. THORACIC DISC LEVELS: No significant disc herniation. No spinal canal stenosis or neural foraminal narrowing. LUMBAR DISC LEVELS: L1-L2: No significant disc herniation. No spinal canal stenosis or neural foraminal narrowing. L2-L3: No significant disc herniation. No spinal canal stenosis or neural foraminal narrowing. L3-L4: No significant disc herniation. No spinal canal stenosis or neural foraminal narrowing. L4-L5: Small disc bulge without spinal canal or neural foraminal stenosis. L5-S1: Mild facet hypertrophy. No spinal canal or neural foraminal stenosis. IMPRESSION: 1. No evidence of discitis or osteomyelitis. Electronically signed by: Franky Stanford MD 03/05/2024 08:14 PM EDT RP  Workstation: [id number]   MR Lumbar Spine W Wo Contrast Result Date: 03/05/2024 EXAM: MRI THORACIC AND LUMBAR SPINE WITH AND WITHOUT INTRAVENOUS CONTRAST 03/05/2024 03:33:47 PM TECHNIQUE: Multiplanar multisequence MRI of the  thoracic and lumbar spine was performed with and without the administration of intravenous contrast. 7 mL of gadobutrol (GADAVIST) 1 MMOL/ML injection was administered. COMPARISON: Lumbar spine MRI dated 06/30/2017. CLINICAL HISTORY: Bacteremia, rule out discitis. FINDINGS: BONES AND ALIGNMENT: Normal alignment. Normal vertebral body heights. Bone marrow signal is unremarkable. No abnormal enhancement. There is no evidence of discitis or osteomyelitis. SPINAL CORD: Normal spinal cord size. Normal spinal cord signal. SOFT TISSUES: Unremarkable. THORACIC DISC LEVELS: No significant disc herniation. No spinal canal stenosis or neural foraminal narrowing. LUMBAR DISC LEVELS: L1-L2: No significant disc herniation. No spinal canal stenosis or neural foraminal narrowing. L2-L3: No significant disc herniation. No spinal canal stenosis or neural foraminal narrowing. L3-L4: No significant disc herniation. No spinal canal stenosis or neural foraminal narrowing. L4-L5: Small disc bulge without spinal canal or neural foraminal stenosis. L5-S1: Mild facet hypertrophy. No spinal canal or neural foraminal stenosis. IMPRESSION: 1. No evidence of discitis or osteomyelitis. Electronically signed by: Franky Stanford MD 03/05/2024 08:14 PM EDT RP Workstation: [id number]   ECHOCARDIOGRAM COMPLETE Result Date: 03/05/2024    ECHOCARDIOGRAM REPORT   Patient Name:   ROBINETTE ESTERS Date of Exam: 03/05/2024 Medical Rec #:  [id number]      Height:       59.0 in Accession #:    [id number]     Weight:       165.0 lb Date of Birth:  06/15/1960     BSA:          1.700 m Patient Age:    62 years       BP:           92/64 mmHg Patient Gender: F              HR:           62 bpm. Exam Location:  Inpatient Procedure: 2D Echo, Cardiac Doppler and Color Doppler (Both Spectral and Color            Flow Doppler were utilized during procedure). Indications:    Bacteremia R78.81  History:        Patient has prior history of Echocardiogram examinations, most                  recent 11/16/2022. CHF, COPD, Signs/Symptoms:Shortness of Breath;                 Risk Factors:Diabetes. H/O Hyperlipidemia, weakness.  Sonographer:    BERNARDA ROCKS Referring Phys: [id number] RAMESH KC IMPRESSIONS  1. Left ventricular ejection fraction, by estimation, is 60 to 65%. The left ventricle has normal function. The left ventricle has no regional wall motion abnormalities. Left ventricular diastolic parameters are consistent with Grade I diastolic dysfunction (impaired relaxation).  2. Right ventricular systolic function is normal. The right ventricular size is normal.  3. The mitral valve is normal in structure. No evidence of mitral valve regurgitation. No evidence of mitral stenosis.  4. The aortic valve is normal in structure. Aortic valve regurgitation is not visualized. No aortic stenosis is present.  5. The inferior vena cava is normal in size with greater than 50% respiratory variability, suggesting right atrial pressure of 3 mmHg. Comparison(s): No significant change from prior study. Prior images reviewed side by side. Conclusion(s)/Recommendation(s): No evidence of valvular vegetations on this  transthoracic echocardiogram. Consider a transesophageal echocardiogram to exclude infective endocarditis if clinically indicated. FINDINGS  Left Ventricle: Left ventricular ejection fraction, by estimation, is 60 to 65%. The left ventricle has normal function. The left ventricle has no regional wall motion abnormalities. The left ventricular internal cavity size was normal in size. There is  no left ventricular hypertrophy. Left ventricular diastolic parameters are consistent with Grade I diastolic dysfunction (impaired relaxation). Right Ventricle: The right ventricular size is normal. No increase in right ventricular wall thickness. Right ventricular systolic function is normal. Left Atrium: Left atrial size was normal in size. Right Atrium: Right atrial size was normal in size. Pericardium:  There is no evidence of pericardial effusion. Mitral Valve: The mitral valve is normal in structure. No evidence of mitral valve regurgitation. No evidence of mitral valve stenosis. MV peak gradient, 6.6 mmHg. The mean mitral valve gradient is 3.0 mmHg. Tricuspid Valve: The tricuspid valve is normal in structure. Tricuspid valve regurgitation is not demonstrated. No evidence of tricuspid stenosis. Aortic Valve: The aortic valve is normal in structure. Aortic valve regurgitation is not visualized. No aortic stenosis is present. Aortic valve mean gradient measures 5.0 mmHg. Aortic valve peak gradient measures 11.0 mmHg. Aortic valve area, by VTI measures 1.88 cm. Pulmonic Valve: The pulmonic valve was normal in structure. Pulmonic valve regurgitation is trivial. No evidence of pulmonic stenosis. Aorta: The aortic root is normal in size and structure. Venous: The inferior vena cava is normal in size with greater than 50% respiratory variability, suggesting right atrial pressure of 3 mmHg. IAS/Shunts: No atrial level shunt detected by color flow Doppler.  LEFT VENTRICLE PLAX 2D LVIDd:         4.00 cm     Diastology LVIDs:         2.20 cm     LV e' medial:    6.00 cm/s LV PW:         0.80 cm     LV E/e' medial:  15.1 LV IVS:        0.90 cm     LV e' lateral:   8.05 cm/s LVOT diam:     1.73 cm     LV E/e' lateral: 11.3 LV SV:         71 LV SV Index:   42 LVOT Area:     2.35 cm  LV Volumes (MOD) LV vol d, MOD A2C: 60.2 ml LV vol d, MOD A4C: 75.1 ml LV vol s, MOD A2C: 20.7 ml LV vol s, MOD A4C: 18.7 ml LV SV MOD A2C:     39.5 ml LV SV MOD A4C:     75.1 ml LV SV MOD BP:      47.9 ml RIGHT VENTRICLE             IVC RV Basal diam:  3.20 cm     IVC diam: 1.30 cm RV S prime:     13.30 cm/s TAPSE (M-mode): 1.9 cm LEFT ATRIUM             Index        RIGHT ATRIUM           Index LA diam:        3.20 cm 1.88 cm/m   RA Area:     11.00 cm LA Vol (A2C):   45.7 ml 26.89 ml/m  RA Volume:   22.10 ml  13.00 ml/m LA Vol (A4C):   36.7  ml 21.59 ml/m LA Biplane Vol:  41.9 ml 24.65 ml/m  AORTIC VALVE                    PULMONIC VALVE AV Area (Vmax):    1.95 cm     PV Vmax:          1.14 m/s AV Area (Vmean):   2.19 cm     PV Peak grad:     5.2 mmHg AV Area (VTI):     1.88 cm     PR End Diast Vel: 3.54 msec AV Vmax:           166.00 cm/s AV Vmean:          98.500 cm/s AV VTI:            0.379 m AV Peak Grad:      11.0 mmHg AV Mean Grad:      5.0 mmHg LVOT Vmax:         138.00 cm/s LVOT Vmean:        91.800 cm/s LVOT VTI:          0.303 m LVOT/AV VTI ratio: 0.80  AORTA Ao Root diam: 2.60 cm Ao Asc diam:  2.00 cm MITRAL VALVE MV Area (PHT): 3.55 cm     SHUNTS MV Peak grad:  6.6 mmHg     Systemic VTI:  0.30 m MV Mean grad:  3.0 mmHg     Systemic Diam: 1.73 cm MV Vmax:       1.28 m/s MV Vmean:      72.8 cm/s MR Peak grad: 53.3 mmHg MR Vmax:      365.00 cm/s MV E velocity: 90.70 cm/s MV A velocity: 126.00 cm/s MV E/A ratio:  0.72 Mihai Croitoru MD Electronically signed by Jerel Balding MD Signature Date/Time: 03/05/2024/2:41:53 PM    Final    DG Chest Portable 1 View Result Date: 03/04/2024 CLINICAL DATA:  Shortness of breath EXAM: PORTABLE CHEST 1 VIEW COMPARISON:  03/01/2024, chest CT 11/10/2023 FINDINGS: The heart size and mediastinal contours are within normal limits. Both lungs are clear. The visualized skeletal structures are unremarkable. IMPRESSION: No active disease. Electronically Signed   By: Luke Bun M.D.   On: 03/04/2024 17:41     Labs:   Basic Metabolic Panel: Recent Labs  Lab 03/01/24 2049 03/04/24 1424 03/05/24 0525 03/06/24 0603 03/07/24 0551  NA 147* 146* 145 142 145  K 3.7 3.8 4.0 4.1 4.2  CL 112* 109 109 107 111  CO2 25 27 23 23 23   GLUCOSE 32* 122* 201* 231* 283*  BUN 20 25* 21 24* 25*  CREATININE 1.60* 1.50* 1.32* 1.61* 1.76*  CALCIUM  9.5 9.9 9.6 9.3 9.2   GFR Estimated Creatinine Clearance: 28.5 mL/min (A) (by C-G formula based on SCr of 1.76 mg/dL (H)). Liver Function Tests: Recent Labs  Lab  03/01/24 2049  AST 27  ALT 8  ALKPHOS 80  BILITOT 0.3  PROT 5.8*  ALBUMIN  3.2*   No results for input(s): LIPASE, AMYLASE in the last 168 hours. No results for input(s): AMMONIA in the last 168 hours. Coagulation profile No results for input(s): INR, PROTIME in the last 168 hours.  CBC: Recent Labs  Lab 03/01/24 2049 03/04/24 1424 03/05/24 0525 03/06/24 0603 03/07/24 0551  WBC 7.4 8.3 7.3 6.7 6.9  NEUTROABS  --  5.6  --   --   --   HGB 12.5 13.7 13.4 12.8 12.1  HCT 40.8 43.9 42.9 41.3 39.6  MCV 84.1 83.1 85.1  84.8 84.3  PLT 153 159 157 149* 145*   Cardiac Enzymes: Recent Labs  Lab 03/04/24 1424  CKTOTAL 44   BNP: Invalid input(s): POCBNP CBG: Recent Labs  Lab 03/06/24 0732 03/06/24 1155 03/06/24 1629 03/06/24 2215 03/07/24 0737  GLUCAP 242* 267* 289* 242* 243*   D-Dimer No results for input(s): DDIMER in the last 72 hours. Hgb A1c No results for input(s): HGBA1C in the last 72 hours. Lipid Profile No results for input(s): CHOL, HDL, LDLCALC, TRIG, CHOLHDL, LDLDIRECT in the last 72 hours. Thyroid  function studies No results for input(s): TSH, T4TOTAL, T3FREE, THYROIDAB in the last 72 hours.  Invalid input(s): FREET3 Anemia work up No results for input(s): VITAMINB12, FOLATE, FERRITIN, TIBC, IRON, RETICCTPCT in the last 72 hours. Microbiology Recent Results (from the past 240 hours)  Culture, blood (routine x 2)     Status: None   Collection Time: 03/01/24  9:15 PM   Specimen: BLOOD  Result Value Ref Range Status   Specimen Description   Final    BLOOD SITE NOT SPECIFIED Performed at [facility], [address] [place], [place] [address]    Special Requests   Final    BOTTLES DRAWN AEROBIC AND ANAEROBIC Blood Culture results may not be optimal due to an inadequate volume of blood received in culture bottles Performed at [facility], [address]  [place], [place] [address]    Culture   Final    NO GROWTH 5 DAYS Performed at [facility], [address] [place], [place] [address]    Report Status 03/06/2024 FINAL  Final  Resp panel by RT-PCR (RSV, Flu A&B, Covid) Anterior Nasal Swab     Status: None   Collection Time: 03/01/24 10:57 PM   Specimen: Anterior Nasal Swab  Result Value Ref Range Status   SARS Coronavirus 2 by RT PCR NEGATIVE NEGATIVE Final    Comment: (NOTE) SARS-CoV-2 target nucleic acids are NOT DETECTED.  The SARS-CoV-2 RNA is generally detectable in upper respiratory specimens during the acute phase of infection. The lowest concentration of SARS-CoV-2 viral copies this assay can detect is 138 copies/mL. A negative result does not preclude SARS-Cov-2 infection and should not be used as the sole basis for treatment or other patient management decisions. A negative result may occur with  improper specimen collection/handling, submission of specimen other than nasopharyngeal swab, presence of viral mutation(s) within the areas targeted by this assay, and inadequate number of viral copies(<138 copies/mL). A negative result must be combined with clinical observations, patient history, and epidemiological information. The expected result is Negative.  Fact Sheet for Patients:  BloggerCourse.com  Fact Sheet for Healthcare Providers:  SeriousBroker.it  This test is no t yet approved or cleared by the [place]  FDA and  has been authorized for detection and/or diagnosis of SARS-CoV-2 by FDA under an Emergency Use Authorization (EUA). This EUA will remain  in effect (meaning this test can be used) for the duration of the COVID-19 declaration under Section 564(b)(1) of the Act, 21 U.S.C.section 360bbb-3(b)(1), unless the authorization is terminated  or revoked sooner.       Influenza A by PCR NEGATIVE NEGATIVE Final   Influenza B by PCR NEGATIVE NEGATIVE  Final    Comment: (NOTE) The Xpert Xpress SARS-CoV-2/FLU/RSV plus assay is intended as an aid in the diagnosis of influenza from Nasopharyngeal swab specimens and should not be used as a sole basis for treatment. Nasal washings and aspirates are unacceptable  for Xpert Xpress SARS-CoV-2/FLU/RSV testing.  Fact Sheet for Patients: BloggerCourse.com  Fact Sheet for Healthcare Providers: SeriousBroker.it  This test is not yet approved or cleared by the [place]  FDA and has been authorized for detection and/or diagnosis of SARS-CoV-2 by FDA under an Emergency Use Authorization (EUA). This EUA will remain in effect (meaning this test can be used) for the duration of the COVID-19 declaration under Section 564(b)(1) of the Act, 21 U.S.C. section 360bbb-3(b)(1), unless the authorization is terminated or revoked.     Resp Syncytial Virus by PCR NEGATIVE NEGATIVE Final    Comment: (NOTE) Fact Sheet for Patients: BloggerCourse.com  Fact Sheet for Healthcare Providers: SeriousBroker.it  This test is not yet approved or cleared by the [place]  FDA and has been authorized for detection and/or diagnosis of SARS-CoV-2 by FDA under an Emergency Use Authorization (EUA). This EUA will remain in effect (meaning this test can be used) for the duration of the COVID-19 declaration under Section 564(b)(1) of the Act, 21 U.S.C. section 360bbb-3(b)(1), unless the authorization is terminated or revoked.  Performed at [facility], [address], [place], [place] [address]   Culture, blood (routine x 2)     Status: Abnormal   Collection Time: 03/02/24  8:04 AM   Specimen: BLOOD RIGHT HAND  Result Value Ref Range Status   Specimen Description   Final    BLOOD RIGHT HAND BOTTLES DRAWN AEROBIC AND ANAEROBIC Performed at [facility], [address]  [place], [place] [address]    Special Requests   Final    Blood Culture results may not be optimal due to an inadequate volume of blood received in culture bottles Performed at [facility], [address], [place], [place] [address]    Culture  Setup Time   Final    GRAM POSITIVE COCCI IN CLUSTERS IN BOTH AEROBIC AND ANAEROBIC BOTTLES HOLLY CHARGE NURSE 03/03/2024 @ 0144 BY DD    Culture (A)  Final    STAPHYLOCOCCUS AUREUS STAPHYLOCOCCUS EPIDERMIDIS THE SIGNIFICANCE OF ISOLATING THIS ORGANISM FROM A SINGLE SET OF BLOOD CULTURES WHEN MULTIPLE SETS ARE DRAWN IS UNCERTAIN. PLEASE NOTIFY THE MICROBIOLOGY DEPARTMENT WITHIN ONE WEEK IF SPECIATION AND SENSITIVITIES ARE REQUIRED. Performed at [facility], [address] [place], [place] [address]    Report Status 03/06/2024 FINAL  Final   Organism ID, Bacteria STAPHYLOCOCCUS AUREUS  Final      Susceptibility   Staphylococcus aureus - MIC*    CIPROFLOXACIN  <=0.5 SENSITIVE Sensitive     ERYTHROMYCIN >=8 RESISTANT Resistant     GENTAMICIN <=0.5 SENSITIVE Sensitive     OXACILLIN 0.5 SENSITIVE Sensitive     TETRACYCLINE <=1 SENSITIVE Sensitive     VANCOMYCIN  1 SENSITIVE Sensitive     TRIMETH/SULFA <=10 SENSITIVE Sensitive     CLINDAMYCIN  RESISTANT Resistant     RIFAMPIN <=0.5 SENSITIVE Sensitive     Inducible Clindamycin  POSITIVE Resistant     LINEZOLID 2 SENSITIVE Sensitive     * STAPHYLOCOCCUS AUREUS  Blood Culture ID Panel (Reflexed)     Status: Abnormal   Collection Time: 03/02/24  8:04 AM  Result Value Ref Range Status   Enterococcus faecalis NOT DETECTED NOT DETECTED Final   Enterococcus Faecium NOT DETECTED NOT DETECTED Final   Listeria monocytogenes NOT DETECTED NOT DETECTED Final   Staphylococcus species DETECTED (A) NOT DETECTED Final    Comment: CRITICAL RESULT CALLED TO, READ BACK BY AND VERIFIED WITH: HOLLY CHARGE NURSE 03/03/2024 @  0144 BY DD    Staphylococcus aureus (BCID) DETECTED (A) NOT DETECTED  Final    Comment: Methicillin (oxacillin)-resistant Staphylococcus aureus (MRSA). MRSA is predictably resistant to beta-lactam antibiotics (except ceftaroline). Preferred therapy is vancomycin  unless clinically contraindicated. Patient requires contact precautions if  hospitalized. CRITICAL RESULT CALLED TO, READ BACK BY AND VERIFIED WITH: HOLLY CHARGE NURSE 03/03/2024 @ 0144 BY DD    Staphylococcus epidermidis DETECTED (A) NOT DETECTED Final    Comment: CRITICAL RESULT CALLED TO, READ BACK BY AND VERIFIED WITH: HOLLY CHARGE NURSE 03/03/2024 @ 0144 BY DD    Staphylococcus lugdunensis NOT DETECTED NOT DETECTED Final   Streptococcus species NOT DETECTED NOT DETECTED Final   Streptococcus agalactiae NOT DETECTED NOT DETECTED Final   Streptococcus pneumoniae NOT DETECTED NOT DETECTED Final   Streptococcus pyogenes NOT DETECTED NOT DETECTED Final   A.calcoaceticus-baumannii NOT DETECTED NOT DETECTED Final   Bacteroides fragilis NOT DETECTED NOT DETECTED Final   Enterobacterales NOT DETECTED NOT DETECTED Final   Enterobacter cloacae complex NOT DETECTED NOT DETECTED Final   Escherichia coli NOT DETECTED NOT DETECTED Final   Klebsiella aerogenes NOT DETECTED NOT DETECTED Final   Klebsiella oxytoca NOT DETECTED NOT DETECTED Final   Klebsiella pneumoniae NOT DETECTED NOT DETECTED Final   Proteus species NOT DETECTED NOT DETECTED Final   Salmonella species NOT DETECTED NOT DETECTED Final   Serratia marcescens NOT DETECTED NOT DETECTED Final   Haemophilus influenzae NOT DETECTED NOT DETECTED Final   Neisseria meningitidis NOT DETECTED NOT DETECTED Final   Pseudomonas aeruginosa NOT DETECTED NOT DETECTED Final   Stenotrophomonas maltophilia NOT DETECTED NOT DETECTED Final   Candida albicans NOT DETECTED NOT DETECTED Final   Candida auris NOT DETECTED NOT DETECTED Final   Candida glabrata NOT DETECTED NOT DETECTED Final   Candida krusei NOT DETECTED NOT DETECTED Final   Candida parapsilosis NOT  DETECTED NOT DETECTED Final   Candida tropicalis NOT DETECTED NOT DETECTED Final   Cryptococcus neoformans/gattii NOT DETECTED NOT DETECTED Final   Methicillin resistance mecA/C DETECTED (A) NOT DETECTED Final    Comment: CRITICAL RESULT CALLED TO, READ BACK BY AND VERIFIED WITH: HOLLY CHARGE NURSE 03/03/2024 @ 0144 BY DD    Meth resistant mecA/C and MREJ DETECTED (A) NOT DETECTED Final    Comment: CRITICAL RESULT CALLED TO, READ BACK BY AND VERIFIED WITH: HOLLY CHARGE NURSE 03/03/2024 @ 0144 BY DD Performed at [facility], [address] [place], [place] [address]   Blood culture (routine x 2)     Status: None (Preliminary result)   Collection Time: 03/04/24  2:50 PM   Specimen: BLOOD  Result Value Ref Range Status   Specimen Description   Final    BLOOD RIGHT ANTECUBITAL Performed at [facility], [address], [place], [place] [address]    Special Requests   Final    BOTTLES DRAWN AEROBIC AND ANAEROBIC Blood Culture adequate volume Performed at [facility], [address], [place], [place] [address]    Culture   Final    NO GROWTH 3 DAYS Performed at [facility], [address], [place], [place] [address]    Report Status PENDING  Incomplete  Blood culture (routine x 2)     Status: None (Preliminary result)   Collection Time: 03/04/24  3:00 PM   Specimen: BLOOD RIGHT FOREARM  Result Value Ref Range Status   Specimen Description   Final    BLOOD RIGHT FOREARM Performed at [facility], [address], [place], [place] [address]    Special Requests   Final    BOTTLES DRAWN AEROBIC AND ANAEROBIC Blood Culture results may not be optimal due to an inadequate volume of blood received in culture bottles Performed at [facility], [address], [place], [place] [address]    Culture   Final    NO GROWTH 3 DAYS Performed at [facility], [address], [place], [place] [address]    Report  Status PENDING  Incomplete  Resp panel by RT-PCR (RSV, Flu A&B, Covid) Anterior Nasal Swab     Status: None   Collection Time: 03/04/24  4:52 PM   Specimen: Anterior Nasal Swab  Result Value Ref Range Status   SARS Coronavirus 2 by RT PCR NEGATIVE NEGATIVE Final    Comment: (NOTE) SARS-CoV-2 target nucleic acids are NOT DETECTED.  The SARS-CoV-2 RNA is generally detectable in upper respiratory specimens during the acute phase of infection. The lowest concentration of SARS-CoV-2 viral copies this assay can detect is 138 copies/mL. A negative result does not preclude SARS-Cov-2 infection and should not be used as the sole basis for treatment or other patient management decisions. A negative result may occur with  improper specimen collection/handling, submission of specimen other than nasopharyngeal swab, presence of viral mutation(s) within the areas targeted by this assay, and inadequate number of viral copies(<138 copies/mL). A negative result must be combined with clinical observations, patient history, and epidemiological information. The expected result is Negative.  Fact Sheet for Patients:  BloggerCourse.com  Fact Sheet for Healthcare Providers:  SeriousBroker.it  This test is no t yet approved or cleared by the [place]  FDA and  has been authorized for detection and/or diagnosis of SARS-CoV-2 by FDA under an Emergency Use Authorization (EUA). This EUA will remain  in effect (meaning this test can be used) for the duration of the COVID-19 declaration under Section 564(b)(1) of the Act, 21 U.S.C.section 360bbb-3(b)(1), unless the authorization is terminated  or revoked sooner.       Influenza A by PCR NEGATIVE NEGATIVE Final   Influenza B by PCR NEGATIVE NEGATIVE Final    Comment: (NOTE) The Xpert Xpress SARS-CoV-2/FLU/RSV plus assay is intended as an aid in the diagnosis of influenza from Nasopharyngeal swab  specimens and should not be used as a sole basis for treatment. Nasal washings and aspirates are unacceptable for Xpert Xpress SARS-CoV-2/FLU/RSV testing.  Fact Sheet for Patients: BloggerCourse.com  Fact Sheet for Healthcare Providers: SeriousBroker.it  This test is not yet approved or cleared by the [place]  FDA and has been authorized for detection and/or diagnosis of SARS-CoV-2 by FDA under an Emergency Use Authorization (EUA). This EUA will remain in effect (meaning this test can be used) for the duration of the COVID-19 declaration under Section 564(b)(1) of the Act, 21 U.S.C. section 360bbb-3(b)(1), unless the authorization is terminated or revoked.     Resp Syncytial Virus by PCR NEGATIVE NEGATIVE Final    Comment: (NOTE) Fact Sheet for Patients: BloggerCourse.com  Fact Sheet for Healthcare Providers: SeriousBroker.it  This test is not yet approved or cleared by the [place]  FDA and has been authorized for detection and/or diagnosis of SARS-CoV-2 by FDA under an Emergency Use Authorization (EUA). This EUA will remain in effect (meaning this test can be used) for the duration of the COVID-19 declaration under Section 564(b)(1) of the Act, 21 U.S.C. section 360bbb-3(b)(1), unless the authorization is terminated or revoked.  Performed at [facility], [address], [place], [place] [address]   C Difficile Quick Screen w PCR reflex     Status: None   Collection Time: 03/05/24  6:57 PM   Specimen: STOOL  Result Value Ref Range Status   C Diff antigen NEGATIVE NEGATIVE Final   C Diff toxin NEGATIVE NEGATIVE Final   C Diff interpretation No C. difficile detected.  Final    Comment: Performed at [facility], [address] [place], [place] [address]  MRSA Next Gen by PCR, Nasal     Status: Abnormal   Collection Time:  03/06/24 12:00 PM   Specimen: Nasal Mucosa; Nasal Swab  Result Value Ref Range Status   MRSA by PCR Next Gen DETECTED (A) NOT DETECTED Final    Comment: RESULT CALLED TO, READ BACK BY AND VERIFIED WITH:  MORGAN, L 03/06/2024 1436 AJ (NOTE) The GeneXpert MRSA Assay (FDA approved for NASAL specimens only), is one component of a comprehensive MRSA colonization surveillance program. It is not intended to diagnose MRSA infection nor to guide or monitor treatment for MRSA infections. Test performance is not FDA approved in patients less than 62 years old. Performed at [facility], [address] [place], [place] [address]      Discharge Instructions:   Discharge Instructions     Diet Carb Modified   Complete by: As directed    Discharge instructions   Complete by: As directed    Cbc weekly while on antibiotics   Increase activity slowly   Complete by: As directed       Allergies as of 03/07/2024       Reactions   Penicillins Hives, Swelling   Tramadol  Nausea And Vomiting   Intense nausea   Vicodin [hydrocodone -acetaminophen ] Nausea And Vomiting   Codeine Nausea Only   Other reaction(s): Unknown   Iodine-131 Hives   Iohexol Hives   Pt developed itching and hives along with nasal congestion; needs 13 hour premeds for future studies, Onset Date: [id number]   Lisinopril Cough   Other reaction(s): Unknown   Sulfa Antibiotics Nausea And Vomiting        Medication List     PAUSE taking these medications    escitalopram 10 MG tablet Wait to take this until your doctor or other care provider tells you to start again. Commonly known as: LEXAPRO 1 tablet Orally Once a day for 90 days   metoprolol  succinate 25 MG 24 hr tablet Wait to take this until your doctor or other care provider tells you to start again. Commonly known as: TOPROL -XL 1 tablet Orally Once a day for 90 days       STOP taking these medications    cephALEXin  500 MG  capsule Commonly known as: KEFLEX    naproxen  500 MG tablet Commonly known as: NAPROSYN        TAKE these medications    albuterol  108 (90 Base) MCG/ACT inhaler Commonly known as: VENTOLIN  HFA Inhale 2 puffs into the lungs every 4 (four) hours as needed for wheezing or shortness of breath. For short   Alcohol Swabs Pads Use prior to any injections to clean your skin   allopurinol  300 MG tablet Commonly known as: ZYLOPRIM  Take 300 mg by mouth 2 (two) times daily.   aspirin  EC 81 MG tablet Take 81 mg by mouth daily.   blood glucose meter kit and supplies Dispense based on patient and insurance preference. Use up to four times daily as directed. (FOR  ICD-10 E10.9, E11.9).   celecoxib 200 MG capsule Commonly known as: CELEBREX Take 200 mg by mouth 2 (two) times daily.   cetirizine 10 MG tablet Commonly known as: ZYRTEC Take 10 mg by mouth at bedtime.   dapagliflozin  propanediol 10 MG Tabs tablet Commonly known as: Farxiga  Take 1 tablet (10 mg total) by mouth daily.   Dexcom G7 Sensor Misc 1 Device by Does not apply route as directed.   docusate sodium  100 MG capsule Commonly known as: COLACE Take 100 mg by mouth 2 (two) times daily.   Embrace Talk Glucose Test test strip Generic drug: glucose blood USE TO CHECK BLOOD SUGARS FOUR TIMES A DAY BEFORE MEALS AND AT BEDTIME DX E11.29   OneTouch Verio test strip Generic drug: glucose blood Check blood sugar 1-2 times daily   esomeprazole  40 MG capsule Commonly known as: NEXIUM  Take 1 capsule (40 mg total) by mouth daily.   fluticasone  50 MCG/ACT nasal spray Commonly known as: FLONASE  Place 2 sprays into both nostrils daily.   gabapentin  400 MG capsule Commonly known as: NEURONTIN  Take 400 mg by mouth 2 (two) times daily.   hydrOXYzine  25 MG tablet Commonly known as: ATARAX  Take 0.5 tablets (12.5 mg total) by mouth every 8 (eight) hours as needed for itching.   insulin  lispro 100 UNIT/ML KwikPen Commonly  known as: HUMALOG  MAX DAILY 30 UNITS What changed:  how much to take how to take this when to take this additional instructions   Insulin  Pen Needle 32G X 4 MM Misc 1 Device by Does not apply route in the morning, at noon, in the evening, and at bedtime.   levothyroxine  150 MCG tablet Commonly known as: SYNTHROID  Take 1 tablet (150 mcg total) by mouth daily. Needs appt for future refills   linezolid 600 MG tablet Commonly known as: ZYVOX Take 1 tablet (600 mg total) by mouth every 12 (twelve) hours for 12 days.   MiraLax  17 GM/SCOOP powder Generic drug: polyethylene glycol powder Take 17 g by mouth daily as needed for moderate constipation.   olmesartan 40 MG tablet Commonly known as: BENICAR Take 40 mg by mouth daily.   Prodigy Autocode Blood Glucose w/Device Kit 1 Device by Does not apply route daily in the afternoon.   Prodigy Twist Top Lancets 28G Misc USE TO CHECK BLOOD SUGARS   simvastatin  20 MG tablet Commonly known as: ZOCOR  Take 1 tablet (20 mg total) by mouth every evening.   Tresiba  FlexTouch 200 UNIT/ML FlexTouch Pen Generic drug: insulin  degludec Inject 8 Units into the skin daily at 6 (six) AM. What changed: when to take this   Trulicity  3 MG/0.5ML Soaj Generic drug: Dulaglutide  Inject 3 mg as directed once a week. What changed: additional instructions        Contact information for follow-up providers     Sigrid Kays, [place], MD Follow up in 1 week(s).   Specialty: Family Medicine Why: BP check Contact information: [address] [place] [address] [phone]         Fayette Bodily, MD Follow up.   Specialty: Infectious Diseases Contact information: [address] [place] [address] [phone]              Contact information for after-discharge care     Home Medical Care     [facility]) .   Service: Home Health Services Contact information: [address] [place]  [phone]  [phone]                      Time coordinating discharge: 45 minutes  Signed:  Harlene RAYMOND Bowl DO  [facility] 03/07/2024, 10:06 AM

## 2024-03-09 LAB — CULTURE, BLOOD (ROUTINE X 2)
Culture: NO GROWTH
Culture: NO GROWTH
Special Requests: ADEQUATE

## 2024-03-13 ENCOUNTER — Encounter: Payer: Self-pay | Admitting: Infectious Diseases

## 2024-03-13 ENCOUNTER — Other Ambulatory Visit: Payer: Self-pay

## 2024-03-13 ENCOUNTER — Ambulatory Visit: Admitting: Infectious Diseases

## 2024-03-13 VITALS — BP 84/53 | HR 63 | Temp 96.5°F | Wt 155.0 lb

## 2024-03-13 DIAGNOSIS — B9562 Methicillin resistant Staphylococcus aureus infection as the cause of diseases classified elsewhere: Secondary | ICD-10-CM | POA: Diagnosis not present

## 2024-03-13 DIAGNOSIS — R7881 Bacteremia: Secondary | ICD-10-CM

## 2024-03-13 LAB — CBC WITH DIFFERENTIAL/PLATELET
Absolute Lymphocytes: 1245 {cells}/uL (ref 850–3900)
Absolute Monocytes: 1245 {cells}/uL — ABNORMAL HIGH (ref 200–950)
Basophils Absolute: 26 {cells}/uL (ref 0–200)
Basophils Relative: 0.2 %
Eosinophils Absolute: 52 {cells}/uL (ref 15–500)
Eosinophils Relative: 0.4 %
HCT: 43.4 % (ref 35.0–45.0)
Hemoglobin: 13.6 g/dL (ref 11.7–15.5)
MCH: 26.6 pg — ABNORMAL LOW (ref 27.0–33.0)
MCHC: 31.3 g/dL — ABNORMAL LOW (ref 32.0–36.0)
MCV: 84.9 fL (ref 80.0–100.0)
MPV: 14 fL — ABNORMAL HIGH (ref 7.5–12.5)
Monocytes Relative: 9.5 %
Neutro Abs: 10532 {cells}/uL — ABNORMAL HIGH (ref 1500–7800)
Neutrophils Relative %: 80.4 %
Platelets: 202 Thousand/uL (ref 140–400)
RBC: 5.11 Million/uL — ABNORMAL HIGH (ref 3.80–5.10)
RDW: 15.5 % — ABNORMAL HIGH (ref 11.0–15.0)
Total Lymphocyte: 9.5 %
WBC: 13.1 Thousand/uL — ABNORMAL HIGH (ref 3.8–10.8)

## 2024-03-13 LAB — COMPLETE METABOLIC PANEL WITHOUT GFR
AG Ratio: 1.3 (calc) (ref 1.0–2.5)
ALT: 13 U/L (ref 6–29)
AST: 24 U/L (ref 10–35)
Albumin: 3.9 g/dL (ref 3.6–5.1)
Alkaline phosphatase (APISO): 94 U/L (ref 37–153)
BUN/Creatinine Ratio: 9 (calc) (ref 6–22)
BUN: 24 mg/dL (ref 7–25)
CO2: 22 mmol/L (ref 20–32)
Calcium: 10 mg/dL (ref 8.6–10.4)
Chloride: 106 mmol/L (ref 98–110)
Creat: 2.63 mg/dL — ABNORMAL HIGH (ref 0.50–1.05)
Globulin: 3 g/dL (ref 1.9–3.7)
Glucose, Bld: 127 mg/dL — ABNORMAL HIGH (ref 65–99)
Potassium: 3.6 mmol/L (ref 3.5–5.3)
Sodium: 143 mmol/L (ref 135–146)
Total Bilirubin: 0.4 mg/dL (ref 0.2–1.2)
Total Protein: 6.9 g/dL (ref 6.1–8.1)

## 2024-03-13 MED ORDER — FLUCONAZOLE 150 MG PO TABS: 150.0000 mg | ORAL_TABLET | Freq: Every day | ORAL | 0 refills | Status: DC

## 2024-03-13 NOTE — Patient Instructions (Signed)
 You are here for follow up of recent MRSA bacteremia 1 botlle of 4- you are linezolid- and today we will check labs- you are c/o burning vaginal pain and soreness You have yeast infection- will give fluconazole  150mg  one dose  Will let you know the results.

## 2024-03-13 NOTE — Progress Notes (Signed)
 NAME: Amy [name]  DOB: 20-Mar-1961  MRN: [id number]  Date/Time: 03/13/2024 1:51 PM   Subjective:   ?pt here with her son Follow up after recent hospitalization 10/20-10/23 at [facility] for MRSA bacteremia  Amy [name] is a 63 y.o. female with a history of CHF, hypertension, diabetes, legal blindness, CKD, diabetic neuropathy, HLD presented to the ED on 1017 with nausea dizziness and weakness with a BP of 74/40 and low blood sugar of 37.  In the ED WBC was normal creatinine was 1.90, chest x-ray was normal she was treated for hypoglycemia and she was stable and discharged on 03/02/2024 the blood cultures were sent before discharge she was recalled to the ED on 1019 because blood culture came back positive for MRSA in one set along with a Staph epidermidis. Patient was complaining of back pain which was worse than her usual And she also was having some upper thigh pain and lower abdominal pain.  She had a CT abdomen and pelvis on 03/05/2024 and that did not show any abnormalities except for mild bladder wall thickening.  But she did and have a postvoid bladder scan which was normal with no residual She had MRI of the thoracic and lumbar spine which did not show any infection Repeat blood culture which was sent on the recall day on 03/04/2024 before antibiotics was started was negative for MRSA 2D echo done on 03/05/2024 showed normal mitral valve, aortic valve was normal in structure and there was no evidence of valvular vegetations on any of those 4 valves.  She was considered a low risk  for endocarditis and hence TEE was not done.  She was initially treated with IV antibiotics and then was discharged on p.o. linezolid to complete 2 weeks of treatment She is here for follow-up She is doing better.  Appetite better No abdominal pain No diarrhea Her main complaint is some burning in her vagina area especially when she pees and soreness in the vagina She had a fall today at home. But has not hurt  herself  As she is legally blind her medications are being given by her son and her nurse who.arranges the pillbox When she was discharged from the hospital she was supplied linezolid.  They are not sure whether it is being placed in the pillbox They will find out and call me tomorrow     Past Medical History:  Diagnosis Date   Arthritis    Asthma    Blind    Blindness and low vision    left eye glass eye,  legally blind in right eye   Diabetes mellitus    Diabetic neuropathy (HCC)    Hyperlipidemia    Hypertension    Hypothyroidism    Intractable nausea and vomiting 03/06/2018   Spinal stenosis     Past Surgical History:  Procedure Laterality Date   ABDOMINAL HYSTERECTOMY  1990   BREAST BIOPSY Left    BREAST BIOPSY Right    BREAST BIOPSY Right 08/19/2022   US  RT BREAST BX W LOC DEV 1ST LESION IMG BX SPEC US  GUIDE 08/19/2022 [facility]   CESAREAN SECTION  1987, 1990   x 2   CHOLECYSTECTOMY  2008   COLONOSCOPY WITH PROPOFOL  N/A 05/05/2023   Procedure: COLONOSCOPY WITH PROPOFOL ;  Surgeon: Rollin Dover, MD;  Location: [facility];  Service: Gastroenterology;  Laterality: N/A;   ENUCLEATION Bilateral 09/15/1998   EYE SURGERY  2016   fitting artificial eye   POLYPECTOMY  05/05/2023  Procedure: POLYPECTOMY;  Surgeon: Rollin Dover, MD;  Location: [facility];  Service: Gastroenterology;;    Social History   Socioeconomic History   Marital status: Single    Spouse name: Not on file   Number of children: 2   Years of education: 12   Highest education level: Not on file  Occupational History   Occupation: N/A    Comment: disabled  Tobacco Use   Smoking status: Former    Current packs/day: 0.00    Types: Cigarettes    Quit date: 05/22/1992    Years since quitting: 31.8   Smokeless tobacco: Never  Vaping Use   Vaping status: Never Used  Substance and Sexual Activity   Alcohol use: No    Comment: quit 20 yrs ago   Drug use: No   Sexual activity: Not  Currently  Other Topics Concern   Not on file  Social History Narrative   Lives alone   caffeine drinks 2 cups of coffee a day, occasional soda     Right Handed   Social Drivers of Health   Financial Resource Strain: Medium Risk (10/02/2019)   Overall Financial Resource Strain (CARDIA)    Difficulty of Paying Living Expenses: Somewhat hard  Food Insecurity: No Food Insecurity (03/04/2024)   Hunger Vital Sign    Worried About Running Out of Food in the Last Year: Never true    Ran Out of Food in the Last Year: Never true  Transportation Needs: No Transportation Needs (03/04/2024)   PRAPARE - Administrator, Civil Service (Medical): No    Lack of Transportation (Non-Medical): No  Physical Activity: Not on file  Stress: Not on file  Social Connections: Unknown (03/09/2023)   Received from [employer]   Social Network    Social Network: Not on file  Intimate Partner Violence: Not At Risk (03/04/2024)   Humiliation, Afraid, Rape, and Kick questionnaire    Fear of Current or Ex-Partner: No    Emotionally Abused: No    Physically Abused: No    Sexually Abused: No    Family History  Problem Relation Age of Onset   Diabetes Sister    Glaucoma Sister    Hypertension Sister    Healthy Mother    Healthy Father    Stroke Brother    Heart attack Paternal Aunt    Breast cancer Neg Hx    Allergies  Allergen Reactions   Penicillins Hives and Swelling   Tramadol  Nausea And Vomiting    Intense nausea   Vicodin [Hydrocodone -Acetaminophen ] Nausea And Vomiting   Codeine Nausea Only    Other reaction(s): Unknown   Iodine-131 Hives   Iohexol Hives    Pt developed itching and hives along with nasal congestion; needs 13 hour premeds for future studies, Onset Date: [id number]    Lisinopril Cough    Other reaction(s): Unknown   Sulfa Antibiotics Nausea And Vomiting   I? Current Outpatient Medications  Medication Sig Dispense Refill   albuterol  (PROVENTIL  HFA;VENTOLIN   HFA) 108 (90 BASE) MCG/ACT inhaler Inhale 2 puffs into the lungs every 4 (four) hours as needed for wheezing or shortness of breath. For short     Alcohol Swabs PADS Use prior to any injections to clean your skin 100 each 0   allopurinol  (ZYLOPRIM ) 300 MG tablet Take 300 mg by mouth 2 (two) times daily.     aspirin  EC 81 MG tablet Take 81 mg by mouth daily.     blood glucose meter kit  and supplies Dispense based on patient and insurance preference. Use up to four times daily as directed. (FOR ICD-10 E10.9, E11.9). 1 each 0   Blood Glucose Monitoring Suppl (PRODIGY AUTOCODE BLOOD GLUCOSE) w/Device KIT 1 Device by Does not apply route daily in the afternoon. 1 kit 0   celecoxib (CELEBREX) 200 MG capsule Take 200 mg by mouth 2 (two) times daily.     cetirizine (ZYRTEC) 10 MG tablet Take 10 mg by mouth at bedtime.     Continuous Blood Gluc Sensor (DEXCOM G7 SENSOR) MISC 1 Device by Does not apply route as directed. 9 each 3   dapagliflozin  propanediol (FARXIGA ) 10 MG TABS tablet Take 1 tablet (10 mg total) by mouth daily. 90 tablet 3   docusate sodium  (COLACE) 100 MG capsule Take 100 mg by mouth 2 (two) times daily.     Dulaglutide  (TRULICITY ) 3 MG/0.5ML SOAJ Inject 3 mg as directed once a week. (Patient taking differently: Inject 3 mg as directed once a week. On fridays) 6 mL 3   EMBRACE TALK GLUCOSE TEST test strip USE TO CHECK BLOOD SUGARS FOUR TIMES A DAY BEFORE MEALS AND AT BEDTIME DX E11.29 300 strip 3   [Paused] escitalopram (LEXAPRO) 10 MG tablet 1 tablet Orally Once a day for 90 days     esomeprazole  (NEXIUM ) 40 MG capsule Take 1 capsule (40 mg total) by mouth daily. 30 capsule 0   fluticasone  (FLONASE ) 50 MCG/ACT nasal spray Place 2 sprays into both nostrils daily.     gabapentin  (NEURONTIN ) 400 MG capsule Take 400 mg by mouth 2 (two) times daily.     glucose blood (ONETOUCH VERIO) test strip Check blood sugar 1-2 times daily 100 each 12   hydrOXYzine  (ATARAX /VISTARIL ) 25 MG tablet Take 0.5  tablets (12.5 mg total) by mouth every 8 (eight) hours as needed for itching. 30 tablet 0   insulin  degludec (TRESIBA  FLEXTOUCH) 200 UNIT/ML FlexTouch Pen Inject 8 Units into the skin daily at 6 (six) AM. (Patient taking differently: Inject 8 Units into the skin at bedtime.) 15 mL 2   insulin  lispro (HUMALOG ) 100 UNIT/ML KwikPen MAX DAILY 30 UNITS (Patient taking differently: Inject 0-30 Units into the skin 3 (three) times daily. PER SLIDING SCALE MAX DAILY 30 UNITS) 30 mL 3   Insulin  Pen Needle 32G X 4 MM MISC 1 Device by Does not apply route in the morning, at noon, in the evening, and at bedtime. 400 each 3   levothyroxine  (SYNTHROID ) 150 MCG tablet Take 1 tablet (150 mcg total) by mouth daily. Needs appt for future refills 90 tablet 0   linezolid (ZYVOX) 600 MG tablet Take 1 tablet (600 mg total) by mouth every 12 (twelve) hours for 12 days. 24 tablet 0   [Paused] metoprolol  succinate (TOPROL -XL) 25 MG 24 hr tablet 1 tablet Orally Once a day for 90 days     olmesartan (BENICAR) 40 MG tablet Take 40 mg by mouth daily.     polyethylene glycol powder (MIRALAX ) 17 GM/SCOOP powder Take 17 g by mouth daily as needed for moderate constipation.     Prodigy Twist Top Lancets 28G MISC USE TO CHECK BLOOD SUGARS     simvastatin  (ZOCOR ) 20 MG tablet Take 1 tablet (20 mg total) by mouth every evening. 90 tablet 0   No current facility-administered medications for this visit.     Abtx:  Anti-infectives (From admission, onward)    None       REVIEW OF SYSTEMS:  Const: negative  fever, negative chills, negative weight loss Eyes: Legally blind due to diabetes ENT: negative coryza, negative sore throat Resp: negative cough, hemoptysis, dyspnea Cards: negative for chest pain, palpitations, lower extremity edema GU: negative for frequency, dysuria and hematuria GI: Negative for abdominal pain, diarrhea, bleeding, constipation Genitourinary Has some vulval/vaginal soreness and burning when she  pees Skin: negative for rash and pruritus Heme: negative for easy bruising and gum/nose bleeding MS: Fall today Neurolo:negative for headaches, dizziness, vertigo, memory problems  Psych: negative for feelings of anxiety, depression  Endocrine:d, diabetes Allergy/Immunology-multiple drug allergies sulfa causes nausea vomiting Most of them are nausea and vomiting  Objective:  VITALS:  BP (!) 84/53   Pulse 63   Temp (!) 96.5 F (35.8 C) (Temporal)   Wt 155 lb (70.3 kg)   PF 97 L/min   BMI 31.31 kg/m   PHYSICAL EXAM:  General: Alert, cooperative, no distress, appears stated age.  Head: Normocephalic, without obvious abnormality, atraumatic. Eyes: Has shades ENT Nares normal. No drainage or sinus tenderness. Lips, mucosa, and tongue normal. No Thrush Neck: Supple, symmetrical, no adenopathy, thyroid : non tender no carotid bruit and no JVD. Back: No CVA tenderness. Lungs: Clear to auscultation bilaterally. No Wheezing or Rhonchi. No rales. Heart: Regular rate and rhythm, no murmur, rub or gallop. Abdomen: Soft, non-tender,not distended. Bowel sounds normal. No masses Genital examination Some erythema in the vulval area Soreness to touch Possible yeast infection Extremities: atraumatic, no cyanosis. No edema. No clubbing Skin: No rashes or lesions. Or bruising Lymph: Cervical, supraclavicular normal. Neurologic: Grossly non-focal Pertinent Labs Lab Results CBC    Component Value Date/Time   WBC 6.9 03/07/2024 0551   RBC 4.70 03/07/2024 0551   HGB 12.1 03/07/2024 0551   HGB 12.5 07/26/2018 1058   HCT 39.6 03/07/2024 0551   HCT 39.4 07/26/2018 1058   PLT 145 (L) 03/07/2024 0551   PLT 169 07/26/2018 1058   MCV 84.3 03/07/2024 0551   MCV 80 07/26/2018 1058   MCH 25.7 (L) 03/07/2024 0551   MCHC 30.6 03/07/2024 0551   RDW 15.5 03/07/2024 0551   RDW 14.7 07/26/2018 1058   LYMPHSABS 1.7 03/04/2024 1424   MONOABS 0.8 03/04/2024 1424   EOSABS 0.1 03/04/2024 1424    BASOSABS 0.1 03/04/2024 1424       Latest Ref Rng & Units 03/07/2024    5:51 AM 03/06/2024    6:03 AM 03/05/2024    5:25 AM  CMP  Glucose 70 - 99 mg/dL 716  768  798   BUN 8 - 23 mg/dL 25  24  21    Creatinine 0.44 - 1.00 mg/dL 8.23  8.38  8.67   Sodium 135 - 145 mmol/L 145  142  145   Potassium 3.5 - 5.1 mmol/L 4.2  4.1  4.0   Chloride 98 - 111 mmol/L 111  107  109   CO2 22 - 32 mmol/L 23  23  23    Calcium  8.9 - 10.3 mg/dL 9.2  9.3  9.6       Microbiology: Recent Results (from the past 240 hours)  Blood culture (routine x 2)     Status: None   Collection Time: 03/04/24  2:50 PM   Specimen: BLOOD  Result Value Ref Range Status   Specimen Description   Final    BLOOD RIGHT ANTECUBITAL Performed at [facility], [address] [place], [place] [address]    Special Requests   Final    BOTTLES DRAWN AEROBIC AND  ANAEROBIC Blood Culture adequate volume Performed at [facility], [address] [place], [place] [address]    Culture   Final    NO GROWTH 5 DAYS Performed at [facility], [address] [place], [place] [address]    Report Status 03/09/2024 FINAL  Final  Blood culture (routine x 2)     Status: None   Collection Time: 03/04/24  3:00 PM   Specimen: BLOOD RIGHT FOREARM  Result Value Ref Range Status   Specimen Description   Final    BLOOD RIGHT FOREARM Performed at [facility], [address] [place], [place] [address]    Special Requests   Final    BOTTLES DRAWN AEROBIC AND ANAEROBIC Blood Culture results may not be optimal due to an inadequate volume of blood received in culture bottles Performed at [facility], [address] [place], [place] [address]    Culture   Final    NO GROWTH 5 DAYS Performed at [facility], [address] [place], [place] [address]    Report Status 03/09/2024 FINAL  Final  Resp panel by RT-PCR (RSV, Flu A&B, Covid) Anterior Nasal Swab     Status:  None   Collection Time: 03/04/24  4:52 PM   Specimen: Anterior Nasal Swab  Result Value Ref Range Status   SARS Coronavirus 2 by RT PCR NEGATIVE NEGATIVE Final    Comment: (NOTE) SARS-CoV-2 target nucleic acids are NOT DETECTED.  The SARS-CoV-2 RNA is generally detectable in upper respiratory specimens during the acute phase of infection. The lowest concentration of SARS-CoV-2 viral copies this assay can detect is 138 copies/mL. A negative result does not preclude SARS-Cov-2 infection and should not be used as the sole basis for treatment or other patient management decisions. A negative result may occur with  improper specimen collection/handling, submission of specimen other than nasopharyngeal swab, presence of viral mutation(s) within the areas targeted by this assay, and inadequate number of viral copies(<138 copies/mL). A negative result must be combined with clinical observations, patient history, and epidemiological information. The expected result is Negative.  Fact Sheet for Patients:  bloggercourse.com  Fact Sheet for Healthcare Providers:  seriousbroker.it  This test is no t yet approved or cleared by the [place]  FDA and  has been authorized for detection and/or diagnosis of SARS-CoV-2 by FDA under an Emergency Use Authorization (EUA). This EUA will remain  in effect (meaning this test can be used) for the duration of the COVID-19 declaration under Section 564(b)(1) of the Act, 21 U.S.C.section 360bbb-3(b)(1), unless the authorization is terminated  or revoked sooner.       Influenza A by PCR NEGATIVE NEGATIVE Final   Influenza B by PCR NEGATIVE NEGATIVE Final    Comment: (NOTE) The Xpert Xpress SARS-CoV-2/FLU/RSV plus assay is intended as an aid in the diagnosis of influenza from Nasopharyngeal swab specimens and should not be used as a sole basis for treatment. Nasal washings and aspirates are unacceptable  for Xpert Xpress SARS-CoV-2/FLU/RSV testing.  Fact Sheet for Patients: bloggercourse.com  Fact Sheet for Healthcare Providers: seriousbroker.it  This test is not yet approved or cleared by the [place]  FDA and has been authorized for detection and/or diagnosis of SARS-CoV-2 by FDA under an Emergency Use Authorization (EUA). This EUA will remain in effect (meaning this test can be used) for the duration of the COVID-19 declaration under Section 564(b)(1) of the Act, 21 U.S.C. section 360bbb-3(b)(1), unless the authorization  is terminated or revoked.     Resp Syncytial Virus by PCR NEGATIVE NEGATIVE Final    Comment: (NOTE) Fact Sheet for Patients: bloggercourse.com  Fact Sheet for Healthcare Providers: seriousbroker.it  This test is not yet approved or cleared by the [place]  FDA and has been authorized for detection and/or diagnosis of SARS-CoV-2 by FDA under an Emergency Use Authorization (EUA). This EUA will remain in effect (meaning this test can be used) for the duration of the COVID-19 declaration under Section 564(b)(1) of the Act, 21 U.S.C. section 360bbb-3(b)(1), unless the authorization is terminated or revoked.  Performed at [facility], [address], [place], [place] [address]   C Difficile Quick Screen w PCR reflex     Status: None   Collection Time: 03/05/24  6:57 PM   Specimen: STOOL  Result Value Ref Range Status   C Diff antigen NEGATIVE NEGATIVE Final   C Diff toxin NEGATIVE NEGATIVE Final   C Diff interpretation No C. difficile detected.  Final    Comment: Performed at [facility], [address], [place], [place] [address]  MRSA Next Gen by PCR, Nasal     Status: Abnormal   Collection Time: 03/06/24 12:00 PM   Specimen: Nasal Mucosa; Nasal Swab  Result Value Ref Range Status   MRSA by PCR Next Gen  DETECTED (A) NOT DETECTED Final    Comment: RESULT CALLED TO, READ BACK BY AND VERIFIED WITH:  MORGAN, L 03/06/2024 1436 AJ (NOTE) The GeneXpert MRSA Assay (FDA approved for NASAL specimens only), is one component of a comprehensive MRSA colonization surveillance program. It is not intended to diagnose MRSA infection nor to guide or monitor treatment for MRSA infections. Test performance is not FDA approved in patients less than 23 years old. Performed at [facility], [address], [place], [place] [address]     ? Impression/Recommendation ? MRSA bacteremia low bioburden and time to positivity was 17 hours also Staph epidermidis in the bottle which was a contaminant so unclear what the significance of this bacteremia was When she was in the hospital MRI of the lumbar and thoracic spine showed no discitis or osteomyelitis Abdominal CT was fine except for mild bladder wall thickening but postvoid bladder scan was negative Repeat blood culture sent before starting antibiotic was also negative 2D echo showed normal valves with no vegetation She was initially given IV antibiotic and then switched to p.o. linezolid to complete 2 weeks on 03/17/2024.  Today she is here for follow-up and we will do labs to make sure there is no thrombocytopenia or leukopenia or anemia  Patient and son unclear whether the linezolid that was supplied to her in the hospital has been placed in her pillbox by her nurse.  They will check with her nurse and get back to me  Vulvovaginal candidiasis Will do 1 dose of fluconazole  150 mg  CKD  Diabetes mellitus on insulin   Legally blind due to diabetes ? _ ________________________________________________ Discussed with patient, and her son in detail Will let them know of the test results Follow-up as needed Note:  This document was prepared using Dragon voice recognition software and may include unintentional dictation errors.

## 2024-03-15 ENCOUNTER — Ambulatory Visit: Payer: Self-pay | Admitting: Infectious Diseases

## 2024-03-22 ENCOUNTER — Emergency Department (HOSPITAL_COMMUNITY)
Admission: EM | Admit: 2024-03-22 | Discharge: 2024-03-22 | Disposition: A | Discharge status: Home / Self Care | Attending: Emergency Medicine | Admitting: Emergency Medicine

## 2024-03-22 ENCOUNTER — Other Ambulatory Visit: Payer: Self-pay

## 2024-03-22 DIAGNOSIS — Z79899 Other long term (current) drug therapy: Secondary | ICD-10-CM | POA: Insufficient documentation

## 2024-03-22 DIAGNOSIS — I10 Essential (primary) hypertension: Secondary | ICD-10-CM | POA: Insufficient documentation

## 2024-03-22 DIAGNOSIS — N3000 Acute cystitis without hematuria: Secondary | ICD-10-CM | POA: Diagnosis not present

## 2024-03-22 DIAGNOSIS — I951 Orthostatic hypotension: Secondary | ICD-10-CM | POA: Diagnosis not present

## 2024-03-22 DIAGNOSIS — Z794 Long term (current) use of insulin: Secondary | ICD-10-CM | POA: Diagnosis not present

## 2024-03-22 DIAGNOSIS — Z7982 Long term (current) use of aspirin: Secondary | ICD-10-CM | POA: Diagnosis not present

## 2024-03-22 DIAGNOSIS — B3731 Acute candidiasis of vulva and vagina: Secondary | ICD-10-CM | POA: Insufficient documentation

## 2024-03-22 DIAGNOSIS — E119 Type 2 diabetes mellitus without complications: Secondary | ICD-10-CM | POA: Diagnosis not present

## 2024-03-22 DIAGNOSIS — R42 Dizziness and giddiness: Secondary | ICD-10-CM | POA: Diagnosis present

## 2024-03-22 LAB — CBC WITH DIFFERENTIAL/PLATELET
Abs Immature Granulocytes: 0.04 K/uL (ref 0.00–0.07)
Basophils Absolute: 0 K/uL (ref 0.0–0.1)
Basophils Relative: 0 %
Eosinophils Absolute: 0.1 K/uL (ref 0.0–0.5)
Eosinophils Relative: 1 %
HCT: 40.6 % (ref 36.0–46.0)
Hemoglobin: 12.8 g/dL (ref 12.0–15.0)
Immature Granulocytes: 1 %
Lymphocytes Relative: 13 %
Lymphs Abs: 1 K/uL (ref 0.7–4.0)
MCH: 26 pg (ref 26.0–34.0)
MCHC: 31.5 g/dL (ref 30.0–36.0)
MCV: 82.4 fL (ref 80.0–100.0)
Monocytes Absolute: 0.8 K/uL (ref 0.1–1.0)
Monocytes Relative: 10 %
Neutro Abs: 6.3 K/uL (ref 1.7–7.7)
Neutrophils Relative %: 75 %
Platelets: 145 K/uL — ABNORMAL LOW (ref 150–400)
RBC: 4.93 MIL/uL (ref 3.87–5.11)
RDW: 16 % — ABNORMAL HIGH (ref 11.5–15.5)
WBC: 8.2 K/uL (ref 4.0–10.5)
nRBC: 0 % (ref 0.0–0.2)

## 2024-03-22 LAB — URINALYSIS, W/ REFLEX TO CULTURE (INFECTION SUSPECTED)
Bilirubin Urine: NEGATIVE
Glucose, UA: >=500 mg/dL — AB
Ketones, ur: 5 mg/dL — AB
Nitrite: NEGATIVE
Protein, ur: 100 mg/dL — AB
Specific Gravity, Urine: 1.011 (ref 1.005–1.030)
WBC, UA: >50 WBC/hpf (ref 0–5)
pH: 6 (ref 5.0–8.0)

## 2024-03-22 LAB — COMPREHENSIVE METABOLIC PANEL WITH GFR
ALT: 16 U/L (ref 0–44)
AST: 16 U/L (ref 15–41)
Albumin: 3.5 g/dL (ref 3.5–5.0)
Alkaline Phosphatase: 123 U/L (ref 38–126)
Anion gap: 9 (ref 5–15)
BUN: 20 mg/dL (ref 8–23)
CO2: 27 mmol/L (ref 22–32)
Calcium: 10.1 mg/dL (ref 8.9–10.3)
Chloride: 107 mmol/L (ref 98–111)
Creatinine, Ser: 1.52 mg/dL — ABNORMAL HIGH (ref 0.44–1.00)
GFR, Estimated: 38 mL/min — ABNORMAL LOW (ref >60)
Glucose, Bld: 175 mg/dL — ABNORMAL HIGH (ref 70–99)
Potassium: 3.8 mmol/L (ref 3.5–5.1)
Sodium: 143 mmol/L (ref 135–145)
Total Bilirubin: 0.4 mg/dL (ref 0.0–1.2)
Total Protein: 6.6 g/dL (ref 6.5–8.1)

## 2024-03-22 LAB — CBG MONITORING, ED: Glucose-Capillary: 212 mg/dL — ABNORMAL HIGH (ref 70–99)

## 2024-03-22 MED ORDER — SODIUM CHLORIDE 0.9 % IV BOLUS
1000.0000 mL | Freq: Once | INTRAVENOUS | Status: AC
  Administered 2024-03-22: 1000 mL via INTRAVENOUS

## 2024-03-22 MED ORDER — FLUCONAZOLE 150 MG PO TABS
150.0000 mg | ORAL_TABLET | Freq: Once | ORAL | Status: AC
  Administered 2024-03-22: 150 mg via ORAL
  Filled 2024-03-22: qty 1

## 2024-03-22 MED ORDER — CEPHALEXIN 500 MG PO CAPS
500.0000 mg | ORAL_CAPSULE | Freq: Once | ORAL | Status: AC
  Administered 2024-03-22: 500 mg via ORAL
  Filled 2024-03-22: qty 1

## 2024-03-22 MED ORDER — CEPHALEXIN 500 MG PO CAPS: 500.0000 mg | ORAL_CAPSULE | Freq: Two times a day (BID) | ORAL | 0 refills | Status: AC

## 2024-03-22 NOTE — Discharge Instructions (Signed)
 You were seen in the emergency department for your dizziness and your urinary symptoms.  You did have a urinary tract infection and your blood pressure did drop when he went from sitting to standing and this is called orthostatic hypotension.  This usually occurs because your blood vessels are not moving your blood up to your heart and your brain passing up upon standing.  I have given you prescription of compression stockings you should wear to help improve your blood flow and take your time when going from sitting to standing to help prevent the dizziness.  You should follow-up with your primary doctor in the next few days to have your symptoms rechecked.  You should return to the emergency department for having fevers despite antibiotics, you are having worsening dizziness and pass out, you have severe abdominal pain or any other new or concerning symptoms.

## 2024-03-22 NOTE — ED Triage Notes (Signed)
 Pt BIB [facility] from home for weakness x1 week and hypotension. Home PT and [facility] got pressures in the 80s, BP 154/83 in triage. Pt denies pain, still has some itching in vaginal area but unsure if she took prescribed diflucan  d/t not being able to distinguish meds that were set up for her. States when she sits up in bed she gets very dizzy. Cbg 280s

## 2024-03-22 NOTE — ED Provider Notes (Signed)
 [place] EMERGENCY DEPARTMENT AT [facility] Provider Note   CSN: [id number] Arrival date & time: 03/22/24  1147     Patient presents with: Dizziness   AVERY EUSTICE is a 63 y.o. female.   Patient is a 63 year old female with recent treatment of MRSA bacteremia, diabetes, hypertension, blindness presenting to the emergency department with dizziness.  The patient states that last night she started to feel lightheaded and dizzy like she might pass out.  She states that she called the ambulance this morning and when they checked her blood pressure she was found to be hypotensive.  She states that she has not had any vomiting or diarrhea and has been constipated the last few days.  She states that she has chronic back pain that is no worse than usual.  She states that she did have some dysuria and urinary frequency however has not urinated today but did have the urge to urinate this morning.  She states that she has had a little bit of a decreased appetite but states that she still has been drinking plenty of fluids.  She states that it caretaker manages her medications so she is not sure what she has been taking but per her records should have completed her antibiotics on 11/2.  Patient states that she still is having vaginal itching and is unsure if she was treated with an antifungal yet.  The history is provided by the patient.  Dizziness      Prior to Admission medications   Medication Sig Start Date End Date Taking? Authorizing Provider  cephALEXin  (KEFLEX ) 500 MG capsule Take 1 capsule (500 mg total) by mouth 2 (two) times daily for 5 days. 03/22/24 03/27/24 Yes Ellouise, Travonna Swindle K, DO  albuterol  (PROVENTIL  HFA;VENTOLIN  HFA) 108 (90 BASE) MCG/ACT inhaler Inhale 2 puffs into the lungs every 4 (four) hours as needed for wheezing or shortness of breath. For short    [provider]  Alcohol Swabs PADS Use prior to any injections to clean your skin 03/07/24   Juvenal Harlene PENNER, DO  allopurinol  (ZYLOPRIM ) 300 MG tablet Take 300 mg by mouth 2 (two) times daily. 11/23/20   [provider]  aspirin  EC 81 MG tablet Take 81 mg by mouth daily.    [provider]  blood glucose meter kit and supplies Dispense based on patient and insurance preference. Use up to four times daily as directed. (FOR ICD-10 E10.9, E11.9). 08/06/20   Levora Reyes SAUNDERS, MD  Blood Glucose Monitoring Suppl (PRODIGY AUTOCODE BLOOD GLUCOSE) w/Device KIT 1 Device by Does not apply route daily in the afternoon. 09/21/23   Shamleffer, Ibtehal Jaralla, MD  celecoxib (CELEBREX) 200 MG capsule Take 200 mg by mouth 2 (two) times daily. 02/14/19   [provider]  cetirizine (ZYRTEC) 10 MG tablet Take 10 mg by mouth at bedtime.    [provider]  Continuous Blood Gluc Sensor (DEXCOM G7 SENSOR) MISC 1 Device by Does not apply route as directed. 10/19/21   Shamleffer, Ibtehal Jaralla, MD  dapagliflozin  propanediol (FARXIGA ) 10 MG TABS tablet Take 1 tablet (10 mg total) by mouth daily. 09/21/23   Shamleffer, Donell Cardinal, MD  docusate sodium  (COLACE) 100 MG capsule Take 100 mg by mouth 2 (two) times daily. 10/26/22   [provider]  Dulaglutide  (TRULICITY ) 3 MG/0.5ML SOAJ Inject 3 mg as directed once a week. Patient taking differently: Inject 3 mg as directed once a week. On fridays 09/21/23   Shamleffer, Ibtehal  Jaralla, MD  EMBRACE TALK GLUCOSE TEST test strip USE TO CHECK BLOOD SUGARS FOUR TIMES A DAY BEFORE MEALS AND AT BEDTIME DX E11.29 07/29/20   Levora Reyes SAUNDERS, MD  escitalopram (LEXAPRO) 10 MG tablet 1 tablet Orally Once a day for 90 days 01/01/24   [provider]  esomeprazole  (NEXIUM ) 40 MG capsule Take 1 capsule (40 mg total) by mouth daily. 07/10/20   Bensimhon, Toribio SAUNDERS, MD  fluconazole  (DIFLUCAN ) 150 MG tablet Take 1 tablet (150 mg total) by mouth daily. 03/13/24   Fayette Bodily, MD  fluticasone  (FLONASE ) 50 MCG/ACT nasal spray Place 2  sprays into both nostrils daily. 12/16/22   [provider]  gabapentin  (NEURONTIN ) 400 MG capsule Take 400 mg by mouth 2 (two) times daily. 08/10/21   [provider]  glucose blood (ONETOUCH VERIO) test strip Check blood sugar 1-2 times daily 09/21/23   Shamleffer, Ibtehal Jaralla, MD  hydrOXYzine  (ATARAX /VISTARIL ) 25 MG tablet Take 0.5 tablets (12.5 mg total) by mouth every 8 (eight) hours as needed for itching. 12/05/20   Christopher Savannah, PA-C  insulin  degludec (TRESIBA  FLEXTOUCH) 200 UNIT/ML FlexTouch Pen Inject 8 Units into the skin daily at 6 (six) AM. Patient taking differently: Inject 8 Units into the skin at bedtime. 03/23/23   Shamleffer, Ibtehal Jaralla, MD  insulin  lispro (HUMALOG ) 100 UNIT/ML KwikPen MAX DAILY 30 UNITS Patient taking differently: Inject 0-30 Units into the skin 3 (three) times daily. PER SLIDING SCALE MAX DAILY 30 UNITS 09/21/23   Shamleffer, Donell Cardinal, MD  Insulin  Pen Needle 32G X 4 MM MISC 1 Device by Does not apply route in the morning, at noon, in the evening, and at bedtime. 09/21/23   Shamleffer, Ibtehal Jaralla, MD  levothyroxine  (SYNTHROID ) 150 MCG tablet Take 1 tablet (150 mcg total) by mouth daily. Needs appt for future refills 08/06/20   Levora Reyes SAUNDERS, MD  metoprolol  succinate (TOPROL -XL) 25 MG 24 hr tablet 1 tablet Orally Once a day for 90 days 01/09/24   [provider]  olmesartan (BENICAR) 40 MG tablet Take 40 mg by mouth daily. 02/01/23   [provider]  polyethylene glycol powder (MIRALAX ) 17 GM/SCOOP powder Take 17 g by mouth daily as needed for moderate constipation. 08/03/23   [provider]  Prodigy Twist Top Lancets 28G MISC USE TO CHECK BLOOD SUGARS 10/02/20   [provider]  simvastatin  (ZOCOR ) 20 MG tablet Take 1 tablet (20 mg total) by mouth every evening. 08/06/20   Levora Reyes SAUNDERS, MD    Allergies: Penicillins, Tramadol , Vicodin [hydrocodone -acetaminophen ], Codeine, Iodine-131, Iohexol,  Lisinopril, and Sulfa antibiotics    Review of Systems  Neurological:  Positive for dizziness.    Updated Vital Signs BP (!) 154/91   Pulse 66   Temp 98.5 F (36.9 C) (Oral)   Resp 18   SpO2 97%   Physical Exam Vitals and nursing note reviewed.  Constitutional:      General: She is not in acute distress.    Appearance: Normal appearance.  HENT:     Head: Normocephalic and atraumatic.     Nose: Nose normal.     Mouth/Throat:     Mouth: Mucous membranes are moist.     Pharynx: Oropharynx is clear.  Eyes:     Extraocular Movements: Extraocular movements intact.     Conjunctiva/sclera: Conjunctivae normal.  Cardiovascular:     Rate and Rhythm: Normal rate and regular rhythm.     Heart sounds: Normal heart sounds.  Pulmonary:  Effort: Pulmonary effort is normal.     Breath sounds: Normal breath sounds.  Abdominal:     General: Abdomen is flat.     Palpations: Abdomen is soft.     Tenderness: There is abdominal tenderness (Suprapubic).  Musculoskeletal:        General: Normal range of motion.     Cervical back: Normal range of motion.     Comments: Diffuse paraspinal muscle tenderness bilaterally  Skin:    General: Skin is warm and dry.  Neurological:     General: No focal deficit present.     Mental Status: She is alert and oriented to person, place, and time.  Psychiatric:        Mood and Affect: Mood normal.        Behavior: Behavior normal.     (all labs ordered are listed, but only abnormal results are displayed) Labs Reviewed  COMPREHENSIVE METABOLIC PANEL WITH GFR - Abnormal; Notable for the following components:      Result Value   Glucose, Bld 175 (*)    Creatinine, Ser 1.52 (*)    GFR, Estimated 38 (*)    All other components within normal limits  CBC WITH DIFFERENTIAL/PLATELET - Abnormal; Notable for the following components:   RDW 16.0 (*)    Platelets 145 (*)    All other components within normal limits  URINALYSIS, W/ REFLEX TO CULTURE  (INFECTION SUSPECTED) - Abnormal; Notable for the following components:   APPearance CLOUDY (*)    Glucose, UA >=500 (*)    Hgb urine dipstick SMALL (*)    Ketones, ur 5 (*)    Protein, ur 100 (*)    Leukocytes,Ua LARGE (*)    Bacteria, UA MANY (*)    All other components within normal limits  CULTURE, BLOOD (ROUTINE X 2)  CULTURE, BLOOD (ROUTINE X 2)  CBG MONITORING, ED    EKG: EKG Interpretation Date/Time:  Friday March 22 2024 14:13:14 EST Ventricular Rate:  63 PR Interval:  188 QRS Duration:  96 QT Interval:  441 QTC Calculation: 452 R Axis:   52  Text Interpretation: Sinus rhythm Anteroseptal infarct, age indeterminate ST elevation, consider inferior injury No significant change since last tracing Confirmed by Ellouise Fine ([id number]) on 03/22/2024 2:17:37 PM  Radiology: No results found.   Procedures   Medications Ordered in the ED  fluconazole  (DIFLUCAN ) tablet 150 mg (has no administration in time range)  cephALEXin  (KEFLEX ) capsule 500 mg (has no administration in time range)  sodium chloride  0.9 % bolus 1,000 mL (1,000 mLs Intravenous New Bag/Given 03/22/24 1529)    Clinical Course as of 03/22/24 1607  Fri Mar 22, 2024  1438 Cr improved from baseline. UA pending. [VK]  1520 Orthostatics positive upon standing. Will give fluid bolus. [VK]  1543 Urine with multiple squams but positive for bacteria and leuks and with symptoms would recommend treatment for UTI. Will also give dose of diflucan  for yeast symptoms. [VK]    Clinical Course User Index [VK] Kingsley, Tresa Jolley K, DO                                 Medical Decision Making This patient presents to the ED with chief complaint(s) of dizziness with pertinent past medical history of recent MRSA bacteremia, hypertension, diabetes which further complicates the presenting complaint. The complaint involves an extensive differential diagnosis and also carries with it a high risk of complications  and morbidity.     The differential diagnosis includes arrhythmia, anemia, dehydration, electrolyte abnormality, hypo or hyperglycemia, infection, orthostatic hypotension  Additional history obtained: Additional history obtained from EMS  Records reviewed previous admission documents and outpatient ID records  ED Course and Reassessment: On patient's arrival she is hemodynamically stable here in no acute distress with blood pressure in the 140s.  Patient will have EKG and labs including repeat blood cultures performed which will be closely discussed.  Independent labs interpretation:  The following labs were independently interpreted: UA positive for UTI, otherwise labs within normal range  Independent visualization of imaging: - N/A  Consultation: - Consulted or discussed management/test interpretation w/ external professional: N/A  Consideration for admission or further workup: Patient has no emergent conditions requiring admission or further work-up at this time and is stable for discharge home with primary care follow-up  Social Determinants of health: N/A    Amount and/or Complexity of Data Reviewed Labs: ordered.  Risk Prescription drug management.       Final diagnoses:  Orthostatic hypotension  Acute cystitis without hematuria  Yeast vaginitis    ED Discharge Orders          Ordered    Compression stockings        03/22/24 1603    cephALEXin  (KEFLEX ) 500 MG capsule  2 times daily        03/22/24 1605               Kingsley, Sander Remedios K, DO 03/22/24 1607

## 2024-03-27 LAB — CULTURE, BLOOD (ROUTINE X 2)
Culture: NO GROWTH
Special Requests: ADEQUATE

## 2024-03-28 LAB — CULTURE, BLOOD (ROUTINE X 2)
Culture: NO GROWTH
Special Requests: ADEQUATE

## 2024-04-24 ENCOUNTER — Ambulatory Visit: Admitting: Podiatry

## 2024-04-24 DIAGNOSIS — Z91199 Patient's noncompliance with other medical treatment and regimen due to unspecified reason: Secondary | ICD-10-CM

## 2024-04-24 NOTE — Progress Notes (Signed)
 1. No-show for appointment

## 2024-05-16 ENCOUNTER — Emergency Department (HOSPITAL_COMMUNITY)

## 2024-05-16 ENCOUNTER — Other Ambulatory Visit: Payer: Self-pay

## 2024-05-16 ENCOUNTER — Inpatient Hospital Stay (HOSPITAL_COMMUNITY)
Admission: EM | Admit: 2024-05-16 | Discharge: 2024-05-23 | DRG: 871 | Disposition: A | Discharge status: Skilled Nursing Facility | Attending: Internal Medicine | Admitting: Internal Medicine

## 2024-05-16 ENCOUNTER — Encounter (HOSPITAL_COMMUNITY): Payer: Self-pay

## 2024-05-16 DIAGNOSIS — I130 Hypertensive heart and chronic kidney disease with heart failure and stage 1 through stage 4 chronic kidney disease, or unspecified chronic kidney disease: Secondary | ICD-10-CM | POA: Diagnosis present

## 2024-05-16 DIAGNOSIS — E870 Hyperosmolality and hypernatremia: Secondary | ICD-10-CM | POA: Diagnosis present

## 2024-05-16 DIAGNOSIS — M109 Gout, unspecified: Secondary | ICD-10-CM | POA: Diagnosis present

## 2024-05-16 DIAGNOSIS — Z79890 Hormone replacement therapy (postmenopausal): Secondary | ICD-10-CM

## 2024-05-16 DIAGNOSIS — E1165 Type 2 diabetes mellitus with hyperglycemia: Secondary | ICD-10-CM | POA: Diagnosis present

## 2024-05-16 DIAGNOSIS — Z888 Allergy status to other drugs, medicaments and biological substances status: Secondary | ICD-10-CM

## 2024-05-16 DIAGNOSIS — N3000 Acute cystitis without hematuria: Secondary | ICD-10-CM | POA: Diagnosis not present

## 2024-05-16 DIAGNOSIS — G9341 Metabolic encephalopathy: Secondary | ICD-10-CM | POA: Diagnosis present

## 2024-05-16 DIAGNOSIS — G8929 Other chronic pain: Secondary | ICD-10-CM | POA: Diagnosis present

## 2024-05-16 DIAGNOSIS — R6520 Severe sepsis without septic shock: Secondary | ICD-10-CM | POA: Diagnosis present

## 2024-05-16 DIAGNOSIS — Z794 Long term (current) use of insulin: Secondary | ICD-10-CM

## 2024-05-16 DIAGNOSIS — I959 Hypotension, unspecified: Secondary | ICD-10-CM | POA: Diagnosis not present

## 2024-05-16 DIAGNOSIS — Z8249 Family history of ischemic heart disease and other diseases of the circulatory system: Secondary | ICD-10-CM

## 2024-05-16 DIAGNOSIS — E872 Acidosis, unspecified: Secondary | ICD-10-CM | POA: Diagnosis present

## 2024-05-16 DIAGNOSIS — E1140 Type 2 diabetes mellitus with diabetic neuropathy, unspecified: Secondary | ICD-10-CM | POA: Diagnosis present

## 2024-05-16 DIAGNOSIS — Z90710 Acquired absence of both cervix and uterus: Secondary | ICD-10-CM

## 2024-05-16 DIAGNOSIS — Z7982 Long term (current) use of aspirin: Secondary | ICD-10-CM | POA: Diagnosis not present

## 2024-05-16 DIAGNOSIS — Z87891 Personal history of nicotine dependence: Secondary | ICD-10-CM

## 2024-05-16 DIAGNOSIS — Z7985 Long-term (current) use of injectable non-insulin antidiabetic drugs: Secondary | ICD-10-CM

## 2024-05-16 DIAGNOSIS — I5032 Chronic diastolic (congestive) heart failure: Secondary | ICD-10-CM | POA: Diagnosis present

## 2024-05-16 DIAGNOSIS — Z823 Family history of stroke: Secondary | ICD-10-CM

## 2024-05-16 DIAGNOSIS — Z880 Allergy status to penicillin: Secondary | ICD-10-CM

## 2024-05-16 DIAGNOSIS — G4733 Obstructive sleep apnea (adult) (pediatric): Secondary | ICD-10-CM | POA: Diagnosis present

## 2024-05-16 DIAGNOSIS — H548 Legal blindness, as defined in USA: Secondary | ICD-10-CM | POA: Diagnosis present

## 2024-05-16 DIAGNOSIS — N1831 Chronic kidney disease, stage 3a: Secondary | ICD-10-CM | POA: Diagnosis present

## 2024-05-16 DIAGNOSIS — Z833 Family history of diabetes mellitus: Secondary | ICD-10-CM

## 2024-05-16 DIAGNOSIS — Z79899 Other long term (current) drug therapy: Secondary | ICD-10-CM

## 2024-05-16 DIAGNOSIS — N390 Urinary tract infection, site not specified: Secondary | ICD-10-CM | POA: Diagnosis present

## 2024-05-16 DIAGNOSIS — R680 Hypothermia, not associated with low environmental temperature: Secondary | ICD-10-CM | POA: Diagnosis present

## 2024-05-16 DIAGNOSIS — Z882 Allergy status to sulfonamides status: Secondary | ICD-10-CM

## 2024-05-16 DIAGNOSIS — N179 Acute kidney failure, unspecified: Principal | ICD-10-CM | POA: Diagnosis present

## 2024-05-16 DIAGNOSIS — E785 Hyperlipidemia, unspecified: Secondary | ICD-10-CM | POA: Diagnosis present

## 2024-05-16 DIAGNOSIS — Z83511 Family history of glaucoma: Secondary | ICD-10-CM

## 2024-05-16 DIAGNOSIS — Z9049 Acquired absence of other specified parts of digestive tract: Secondary | ICD-10-CM

## 2024-05-16 DIAGNOSIS — E039 Hypothyroidism, unspecified: Secondary | ICD-10-CM | POA: Diagnosis present

## 2024-05-16 DIAGNOSIS — A419 Sepsis, unspecified organism: Secondary | ICD-10-CM | POA: Diagnosis present

## 2024-05-16 DIAGNOSIS — E1122 Type 2 diabetes mellitus with diabetic chronic kidney disease: Secondary | ICD-10-CM | POA: Diagnosis present

## 2024-05-16 DIAGNOSIS — Z91041 Radiographic dye allergy status: Secondary | ICD-10-CM

## 2024-05-16 DIAGNOSIS — Z885 Allergy status to narcotic agent status: Secondary | ICD-10-CM

## 2024-05-16 DIAGNOSIS — E860 Dehydration: Secondary | ICD-10-CM | POA: Diagnosis present

## 2024-05-16 DIAGNOSIS — L89896 Pressure-induced deep tissue damage of other site: Secondary | ICD-10-CM | POA: Diagnosis present

## 2024-05-16 DIAGNOSIS — R5383 Other fatigue: Secondary | ICD-10-CM | POA: Diagnosis present

## 2024-05-16 DIAGNOSIS — I2510 Atherosclerotic heart disease of native coronary artery without angina pectoris: Secondary | ICD-10-CM | POA: Diagnosis present

## 2024-05-16 LAB — CBC WITH DIFFERENTIAL/PLATELET
Abs Immature Granulocytes: 0.05 K/uL (ref 0.00–0.07)
Basophils Absolute: 0.1 K/uL (ref 0.0–0.1)
Basophils Relative: 1 %
Eosinophils Absolute: 0.1 K/uL (ref 0.0–0.5)
Eosinophils Relative: 1 %
HCT: 42.8 % (ref 36.0–46.0)
Hemoglobin: 13.5 g/dL (ref 12.0–15.0)
Immature Granulocytes: 1 %
Lymphocytes Relative: 10 %
Lymphs Abs: 1.1 K/uL (ref 0.7–4.0)
MCH: 27.9 pg (ref 26.0–34.0)
MCHC: 31.5 g/dL (ref 30.0–36.0)
MCV: 88.4 fL (ref 80.0–100.0)
Monocytes Absolute: 0.7 K/uL (ref 0.1–1.0)
Monocytes Relative: 6 %
Neutro Abs: 9.2 K/uL — ABNORMAL HIGH (ref 1.7–7.7)
Neutrophils Relative %: 81 %
Platelets: 145 K/uL — ABNORMAL LOW (ref 150–400)
RBC: 4.84 MIL/uL (ref 3.87–5.11)
RDW: 16.5 % — ABNORMAL HIGH (ref 11.5–15.5)
WBC: 11.1 K/uL — ABNORMAL HIGH (ref 4.0–10.5)
nRBC: 0 % (ref 0.0–0.2)

## 2024-05-16 LAB — URINALYSIS, W/ REFLEX TO CULTURE (INFECTION SUSPECTED)
Bilirubin Urine: NEGATIVE
Glucose, UA: >=500 mg/dL — AB
Ketones, ur: 5 mg/dL — AB
Nitrite: NEGATIVE
Protein, ur: 30 mg/dL — AB
Specific Gravity, Urine: 1.012 (ref 1.005–1.030)
WBC, UA: >50 WBC/hpf (ref 0–5)
pH: 5 (ref 5.0–8.0)

## 2024-05-16 LAB — COMPREHENSIVE METABOLIC PANEL WITH GFR
ALT: <5 U/L (ref 0–44)
AST: 18 U/L (ref 15–41)
Albumin: 2.7 g/dL — ABNORMAL LOW (ref 3.5–5.0)
Alkaline Phosphatase: 62 U/L (ref 38–126)
Anion gap: 21 — ABNORMAL HIGH (ref 5–15)
BUN: 44 mg/dL — ABNORMAL HIGH (ref 8–23)
CO2: 14 mmol/L — ABNORMAL LOW (ref 22–32)
Calcium: 8.4 mg/dL — ABNORMAL LOW (ref 8.9–10.3)
Chloride: 113 mmol/L — ABNORMAL HIGH (ref 98–111)
Creatinine, Ser: 2.15 mg/dL — ABNORMAL HIGH (ref 0.44–1.00)
GFR, Estimated: 25 mL/min — ABNORMAL LOW
Glucose, Bld: 203 mg/dL — ABNORMAL HIGH (ref 70–99)
Potassium: 4 mmol/L (ref 3.5–5.1)
Sodium: 147 mmol/L — ABNORMAL HIGH (ref 135–145)
Total Bilirubin: 0.5 mg/dL (ref 0.0–1.2)
Total Protein: 5.3 g/dL — ABNORMAL LOW (ref 6.5–8.1)

## 2024-05-16 LAB — RESP PANEL BY RT-PCR (RSV, FLU A&B, COVID)  RVPGX2
Influenza A by PCR: NEGATIVE
Influenza B by PCR: NEGATIVE
Resp Syncytial Virus by PCR: NEGATIVE
SARS Coronavirus 2 by RT PCR: NEGATIVE

## 2024-05-16 LAB — I-STAT CG4 LACTIC ACID, ED: Lactic Acid, Venous: 4.1 mmol/L (ref 0.5–1.9)

## 2024-05-16 MED ORDER — LACTATED RINGERS IV BOLUS (SEPSIS)
1000.0000 mL | Freq: Once | INTRAVENOUS | Status: AC
  Administered 2024-05-16: 1000 mL via INTRAVENOUS

## 2024-05-16 MED ORDER — VANCOMYCIN HCL IN DEXTROSE 1-5 GM/200ML-% IV SOLN: 1000.0000 mg | Freq: Once | INTRAVENOUS | Status: DC

## 2024-05-16 MED ORDER — LACTATED RINGERS IV BOLUS (SEPSIS)
250.0000 mL | Freq: Once | INTRAVENOUS | Status: AC
  Administered 2024-05-16: 250 mL via INTRAVENOUS

## 2024-05-16 MED ORDER — VANCOMYCIN HCL 1500 MG/300ML IV SOLN
1500.0000 mg | Freq: Once | INTRAVENOUS | Status: AC
  Administered 2024-05-16: 1500 mg via INTRAVENOUS
  Filled 2024-05-16: qty 300

## 2024-05-16 MED ORDER — LACTATED RINGERS IV SOLN: INTRAVENOUS | Status: AC

## 2024-05-16 MED ORDER — SODIUM CHLORIDE 0.9 % IV SOLN
2.0000 g | Freq: Once | INTRAVENOUS | Status: AC
  Administered 2024-05-16: 2 g via INTRAVENOUS
  Filled 2024-05-16: qty 20

## 2024-05-16 NOTE — ED Notes (Signed)
Lab requested for blood draw

## 2024-05-16 NOTE — Sepsis Progress Note (Addendum)
 Elink monitoring for the code sepsis protocol.   2315: Notified bedside nurse of need to draw 2nd lactic acid.

## 2024-05-16 NOTE — ED Triage Notes (Signed)
 [facility] reports pt coming from home for hypotension. Upon EMS arrival EMS unable to get initial manual BP. When able it was 61/35. Able to get 500NS after fluids 87/50. Pt CAOx2. Family reports pt has had AMSx3 weeks and failure to thrive for the past few months and UTI as well.

## 2024-05-16 NOTE — ED Notes (Signed)
 Warm blankets placed on pt.

## 2024-05-16 NOTE — ED Provider Notes (Signed)
 " [place] EMERGENCY DEPARTMENT AT [facility] Provider Note   CSN: [id number] Arrival date & time: 05/16/24  1818     Patient presents with: Hypotension   ARMINDA [name] is a 64 y.o. female history of UTI, MRSA bacteremia, diastolic heart failure, here presenting with altered mental status and hypotension.  Patient is from home and has not been eating drinking much recently.  Patient was noted to be more altered and confused.  EMS got there and had a manual blood sugar of 61/35 and gave 500 cc bolus and blood pressure went up to 87/50.  Patient has a history of UTI.  Patient unable to give me much history   The history is provided by the patient and the spouse.       Prior to Admission medications  Medication Sig Start Date End Date Taking? Authorizing Provider  albuterol  (PROVENTIL  HFA;VENTOLIN  HFA) 108 (90 BASE) MCG/ACT inhaler Inhale 2 puffs into the lungs every 4 (four) hours as needed for wheezing or shortness of breath. For short    [provider]  Alcohol  Swabs  PADS Use prior to any injections to clean your skin 03/07/24   Amy Harlene PENNER, DO  allopurinol  (ZYLOPRIM ) 300 MG tablet Take 300 mg by mouth 2 (two) times daily. 11/23/20   [provider]  aspirin  EC 81 MG tablet Take 81 mg by mouth daily.    [provider]  blood glucose meter kit and supplies Dispense based on patient and insurance preference. Use up to four times daily as directed. (FOR ICD-10 E10.9, E11.9). 08/06/20   Amy Reyes SAUNDERS, MD  Blood Glucose Monitoring Suppl (PRODIGY AUTOCODE BLOOD GLUCOSE) w/Device KIT 1 Device by Does not apply route daily in the afternoon. 09/21/23   Shamleffer, Ibtehal Jaralla, MD  celecoxib  (CELEBREX ) 200 MG capsule Take 200 mg by mouth 2 (two) times daily. 02/14/19   [provider]  cetirizine (ZYRTEC) 10 MG tablet Take 10 mg by mouth at bedtime.    [provider]  Continuous Blood Gluc Sensor (DEXCOM G7 SENSOR) MISC 1 Device by  Does not apply route as directed. 10/19/21   Shamleffer, Ibtehal Jaralla, MD  dapagliflozin  propanediol (FARXIGA ) 10 MG TABS tablet Take 1 tablet (10 mg total) by mouth daily. 09/21/23   Shamleffer, Donell Cardinal, MD  docusate sodium  (COLACE) 100 MG capsule Take 100 mg by mouth 2 (two) times daily. 10/26/22   [provider]  Dulaglutide  (TRULICITY ) 3 MG/0.5ML SOAJ Inject 3 mg as directed once a week. Patient taking differently: Inject 3 mg as directed once a week. On fridays 09/21/23   Shamleffer, Ibtehal Jaralla, MD  EMBRACE TALK GLUCOSE TEST test strip USE TO CHECK BLOOD SUGARS FOUR TIMES A DAY BEFORE MEALS AND AT BEDTIME DX E11.29 07/29/20   Amy Reyes SAUNDERS, MD  [Paused] escitalopram (LEXAPRO) 10 MG tablet 1 tablet Orally Once a day for 90 days Wait to take this until your doctor or other care provider tells you to start again. 01/01/24   [provider]  esomeprazole  (NEXIUM ) 40 MG capsule Take 1 capsule (40 mg total) by mouth daily. 07/10/20   Bensimhon, Toribio SAUNDERS, MD  fluconazole  (DIFLUCAN ) 150 MG tablet Take 1 tablet (150 mg total) by mouth daily. 03/13/24   Amy Bodily, MD  fluticasone  (FLONASE ) 50 MCG/ACT nasal spray Place 2 sprays into both nostrils daily. 12/16/22   [provider]  gabapentin  (NEURONTIN ) 400 MG capsule Take 400 mg by mouth 2 (two) times daily. 08/10/21  [provider]  glucose blood (ONETOUCH VERIO) test strip Check blood sugar 1-2 times daily 09/21/23   Shamleffer, Donell Cardinal, MD  hydrOXYzine  (ATARAX /VISTARIL ) 25 MG tablet Take 0.5 tablets (12.5 mg total) by mouth every 8 (eight) hours as needed for itching. 12/05/20   Amy Savannah, PA-C  insulin  degludec (TRESIBA  FLEXTOUCH) 200 UNIT/ML FlexTouch Pen Inject 8 Units into the skin daily at 6 (six) AM. Patient taking differently: Inject 8 Units into the skin at bedtime. 03/23/23   Shamleffer, Ibtehal Jaralla, MD  insulin  lispro (HUMALOG ) 100 UNIT/ML KwikPen MAX DAILY 30 UNITS Patient  taking differently: Inject 0-30 Units into the skin 3 (three) times daily. PER SLIDING SCALE MAX DAILY 30 UNITS 09/21/23   Shamleffer, Donell Cardinal, MD  Insulin  Pen Needle 32G X 4 MM MISC 1 Device by Does not apply route in the morning, at noon, in the evening, and at bedtime. 09/21/23   Shamleffer, Ibtehal Jaralla, MD  levothyroxine  (SYNTHROID ) 150 MCG tablet Take 1 tablet (150 mcg total) by mouth daily. Needs appt for future refills 08/06/20   Amy Reyes SAUNDERS, MD  [Paused] metoprolol  succinate (TOPROL -XL) 25 MG 24 hr tablet 1 tablet Orally Once a day for 90 days Wait to take this until your doctor or other care provider tells you to start again. 01/09/24   [provider]  olmesartan (BENICAR) 40 MG tablet Take 40 mg by mouth daily. 02/01/23   [provider]  polyethylene glycol powder (MIRALAX ) 17 GM/SCOOP powder Take 17 g by mouth daily as needed for moderate constipation. 08/03/23   [provider]  Prodigy Twist Top Lancets 28G MISC USE TO CHECK BLOOD SUGARS 10/02/20   [provider]  simvastatin  (ZOCOR ) 20 MG tablet Take 1 tablet (20 mg total) by mouth every evening. 08/06/20   Amy Reyes SAUNDERS, MD    Allergies: Penicillins, Tramadol , Vicodin [hydrocodone -acetaminophen ], Codeine, Iodine-131, Iohexol, Lisinopril, and Sulfa antibiotics    Review of Systems  Psychiatric/Behavioral:  Positive for confusion.   All other systems reviewed and are negative.   Updated Vital Signs BP 103/62   Pulse 83   Temp (S) (!) 96.7 F (35.9 C) (Rectal)   Resp 18   SpO2 98%   Physical Exam Vitals and nursing note reviewed.  Constitutional:      Comments: Altered and confused   HENT:     Head: Normocephalic.     Nose: Nose normal.     Mouth/Throat:     Mouth: Mucous membranes are dry.  Eyes:     Extraocular Movements: Extraocular movements intact.     Pupils: Pupils are equal, round, and reactive to light.  Cardiovascular:     Rate and Rhythm: Normal rate and  regular rhythm.     Pulses: Normal pulses.     Heart sounds: Normal heart sounds.  Pulmonary:     Effort: Pulmonary effort is normal.     Breath sounds: Normal breath sounds.  Abdominal:     General: Abdomen is flat.     Palpations: Abdomen is soft.  Musculoskeletal:        General: Normal range of motion.     Cervical back: Normal range of motion.     Comments: No obvious sacral decub ulcer  Skin:    General: Skin is warm.     Capillary Refill: Capillary refill takes less than 2 seconds.  Neurological:     Comments: Confused but moving all extremities  Psychiatric:        Mood  and Affect: Mood normal.     (all labs ordered are listed, but only abnormal results are displayed) Labs Reviewed  RESP PANEL BY RT-PCR (RSV, FLU A&B, COVID)  [id number]  CULTURE, BLOOD (ROUTINE X 2)  CULTURE, BLOOD (ROUTINE X 2)  COMPREHENSIVE METABOLIC PANEL WITH GFR  CBC WITH DIFFERENTIAL/PLATELET  PROTIME-INR  URINALYSIS, W/ REFLEX TO CULTURE (INFECTION SUSPECTED)  I-STAT CG4 LACTIC ACID, ED    EKG: None  Radiology: No results found.   Procedures   CRITICAL CARE Performed by: Alm VEAR Cave   Total critical care time: 38 minutes  Critical care time was exclusive of separately billable procedures and treating other patients.  Critical care was necessary to treat or prevent imminent or life-threatening deterioration.  Critical care was time spent personally by me on the following activities: development of treatment plan with patient and/or surrogate as well as nursing, discussions with consultants, evaluation of patient's response to treatment, examination of patient, obtaining history from patient or surrogate, ordering and performing treatments and interventions, ordering and review of laboratory studies, ordering and review of radiographic studies, pulse oximetry and re-evaluation of patient's condition.   Medications Ordered in the ED  lactated ringers infusion (has no administration  in time range)  lactated ringers bolus 1,000 mL (1,000 mLs Intravenous New Bag/Given 05/16/24 1852)    And  lactated ringers bolus 1,000 mL (has no administration in time range)    And  lactated ringers bolus 250 mL (250 mLs Intravenous New Bag/Given 05/16/24 1852)  cefTRIAXone  (ROCEPHIN ) 2 g in sodium chloride  0.9 % 100 mL IVPB (has no administration in time range)                                    Medical Decision Making Amy [name] is a 64 y.o. female here presenting with confusion and altered mental status.  Patient appears dehydrated.  Patient also has history of recurrent UTI and MRSA bacteremia and finished a course of linezolid .  Patient is hypothermic and was hypotensive prior to arrival.  Will initiate code sepsis and will get CBC and CMP and lactate and culture and will give 30 cc/kg bolus and empiric antibiotics for UTI and also Vanco for possible MRSA  10:45 PM White blood cell count is 11.  Patient does have an AKI with creatinine of 2.1.  Sodium is 147.  Lactate is elevated at 4.1.  Chest x-ray is clear and CT head unremarkable.  UA showed UTI.  Patient will be admitted for AKI, hypernatremia, UTI  Problems Addressed: AKI (acute kidney injury): acute illness or injury Hypernatremia: acute illness or injury  Amount and/or Complexity of Data Reviewed Labs: ordered. Decision-making details documented in ED Course. Radiology: ordered and independent interpretation performed. Decision-making details documented in ED Course.  Risk Prescription drug management. Decision regarding hospitalization.     Final diagnoses:  None    ED Discharge Orders     None          Cave Alm Macho, MD 05/16/24 2255  "

## 2024-05-17 DIAGNOSIS — N3000 Acute cystitis without hematuria: Secondary | ICD-10-CM | POA: Diagnosis not present

## 2024-05-17 LAB — BLOOD CULTURE ID PANEL (REFLEXED) - BCID2

## 2024-05-17 LAB — CBC
HCT: 38.3 % (ref 36.0–46.0)
Hemoglobin: 12.4 g/dL (ref 12.0–15.0)
MCH: 27.3 pg (ref 26.0–34.0)
MCHC: 32.4 g/dL (ref 30.0–36.0)
MCV: 84.4 fL (ref 80.0–100.0)
Platelets: 141 K/uL — ABNORMAL LOW (ref 150–400)
RBC: 4.54 MIL/uL (ref 3.87–5.11)
RDW: 16.1 % — ABNORMAL HIGH (ref 11.5–15.5)
WBC: 10.2 K/uL (ref 4.0–10.5)
nRBC: 0 % (ref 0.0–0.2)

## 2024-05-17 LAB — BASIC METABOLIC PANEL WITH GFR
Anion gap: 17 — ABNORMAL HIGH (ref 5–15)
BUN: 37 mg/dL — ABNORMAL HIGH (ref 8–23)
CO2: 19 mmol/L — ABNORMAL LOW (ref 22–32)
Calcium: 9 mg/dL (ref 8.9–10.3)
Chloride: 106 mmol/L (ref 98–111)
Creatinine, Ser: 1.55 mg/dL — ABNORMAL HIGH (ref 0.44–1.00)
GFR, Estimated: 37 mL/min — ABNORMAL LOW
Glucose, Bld: 151 mg/dL — ABNORMAL HIGH (ref 70–99)
Potassium: 4.2 mmol/L (ref 3.5–5.1)
Sodium: 141 mmol/L (ref 135–145)

## 2024-05-17 LAB — PROTIME-INR
INR: 1.1 (ref 0.8–1.2)
Prothrombin Time: 14.5 s (ref 11.4–15.2)

## 2024-05-17 LAB — I-STAT CG4 LACTIC ACID, ED: Lactic Acid, Venous: 2.1 mmol/L (ref 0.5–1.9)

## 2024-05-17 LAB — CBG MONITORING, ED: Glucose-Capillary: 184 mg/dL — ABNORMAL HIGH (ref 70–99)

## 2024-05-17 MED ORDER — SODIUM CHLORIDE 0.9 % IV SOLN
2.0000 g | INTRAVENOUS | Status: DC
  Administered 2024-05-17 – 2024-05-18 (×2): 2 g via INTRAVENOUS
  Filled 2024-05-17 (×2): qty 20

## 2024-05-17 MED ORDER — SIMVASTATIN 20 MG PO TABS
20.0000 mg | ORAL_TABLET | Freq: Every evening | ORAL | Status: DC
  Administered 2024-05-17 – 2024-05-22 (×6): 20 mg via ORAL
  Filled 2024-05-17 (×7): qty 1

## 2024-05-17 MED ORDER — IRBESARTAN 300 MG PO TABS
300.0000 mg | ORAL_TABLET | Freq: Every day | ORAL | Status: DC
  Administered 2024-05-17 – 2024-05-18 (×2): 300 mg via ORAL
  Filled 2024-05-17 (×2): qty 1

## 2024-05-17 MED ORDER — ASPIRIN 81 MG PO TBEC
81.0000 mg | DELAYED_RELEASE_TABLET | Freq: Every day | ORAL | Status: DC
  Administered 2024-05-17 – 2024-05-23 (×7): 81 mg via ORAL
  Filled 2024-05-17 (×7): qty 1

## 2024-05-17 MED ORDER — ALLOPURINOL 300 MG PO TABS
300.0000 mg | ORAL_TABLET | Freq: Every day | ORAL | Status: DC
  Administered 2024-05-17 – 2024-05-23 (×7): 300 mg via ORAL
  Filled 2024-05-17 (×4): qty 1
  Filled 2024-05-17: qty 3
  Filled 2024-05-17 (×2): qty 1

## 2024-05-17 MED ORDER — ONDANSETRON HCL 4 MG PO TABS: 4.0000 mg | ORAL_TABLET | Freq: Four times a day (QID) | ORAL | Status: DC | PRN

## 2024-05-17 MED ORDER — ACETAMINOPHEN 650 MG RE SUPP: 650.0000 mg | Freq: Four times a day (QID) | RECTAL | Status: DC | PRN

## 2024-05-17 MED ORDER — LEVOTHYROXINE SODIUM 25 MCG PO TABS
125.0000 ug | ORAL_TABLET | Freq: Every day | ORAL | Status: DC
  Administered 2024-05-17 – 2024-05-23 (×7): 125 ug via ORAL
  Filled 2024-05-17 (×7): qty 1

## 2024-05-17 MED ORDER — ONDANSETRON HCL 4 MG/2ML IJ SOLN: 4.0000 mg | Freq: Four times a day (QID) | INTRAMUSCULAR | Status: DC | PRN

## 2024-05-17 MED ORDER — VANCOMYCIN HCL 1250 MG/250ML IV SOLN
1250.0000 mg | INTRAVENOUS | Status: DC
  Filled 2024-05-17: qty 250

## 2024-05-17 MED ORDER — ENOXAPARIN SODIUM 30 MG/0.3ML IJ SOSY
30.0000 mg | PREFILLED_SYRINGE | INTRAMUSCULAR | Status: DC
  Administered 2024-05-17 – 2024-05-18 (×2): 30 mg via SUBCUTANEOUS
  Filled 2024-05-17 (×2): qty 0.3

## 2024-05-17 MED ORDER — ACETAMINOPHEN 325 MG PO TABS
650.0000 mg | ORAL_TABLET | Freq: Four times a day (QID) | ORAL | Status: DC | PRN
  Administered 2024-05-18 – 2024-05-23 (×2): 650 mg via ORAL
  Filled 2024-05-17 (×3): qty 2

## 2024-05-17 MED ORDER — GABAPENTIN 400 MG PO CAPS
400.0000 mg | ORAL_CAPSULE | Freq: Two times a day (BID) | ORAL | Status: DC
  Administered 2024-05-17 – 2024-05-23 (×13): 400 mg via ORAL
  Filled 2024-05-17 (×13): qty 1

## 2024-05-17 MED ORDER — VANCOMYCIN HCL IN DEXTROSE 1-5 GM/200ML-% IV SOLN: 1000.0000 mg | INTRAVENOUS | Status: DC

## 2024-05-17 NOTE — Progress Notes (Signed)
 PHARMACY - PHYSICIAN COMMUNICATION CRITICAL VALUE ALERT - BLOOD CULTURE IDENTIFICATION (BCID)  Amy [name] is an 64 y.o. female who presented to [facility] on 05/16/2024 with a chief complaint of hypotension at home  Assessment:  Microbiology reports one set of blood culture with GPC, staph epidermidis on BCID with MecA gene detected.  Could be contamination  Name of physician (or Provider) Contacted: Dr. Mdala-Gausi  Current antibiotics: Vancomycin  and ceftriaxone   Changes to prescribed antibiotics recommended: none needed  Patient is on recommended antibiotics - No changes needed  Results for orders placed or performed during the hospital encounter of 05/16/24  Blood Culture ID Panel (Reflexed) (Collected: 05/16/2024  7:54 PM)  Result Value Ref Range   Enterococcus faecalis NOT DETECTED NOT DETECTED   Enterococcus Faecium NOT DETECTED NOT DETECTED   Listeria monocytogenes NOT DETECTED NOT DETECTED   Staphylococcus species DETECTED (A) NOT DETECTED   Staphylococcus aureus (BCID) NOT DETECTED NOT DETECTED   Staphylococcus epidermidis DETECTED (A) NOT DETECTED   Staphylococcus lugdunensis NOT DETECTED NOT DETECTED   Streptococcus species NOT DETECTED NOT DETECTED   Streptococcus agalactiae NOT DETECTED NOT DETECTED   Streptococcus pneumoniae NOT DETECTED NOT DETECTED   Streptococcus pyogenes NOT DETECTED NOT DETECTED   A.calcoaceticus-baumannii NOT DETECTED NOT DETECTED   Bacteroides fragilis NOT DETECTED NOT DETECTED   Enterobacterales NOT DETECTED NOT DETECTED   Enterobacter cloacae complex NOT DETECTED NOT DETECTED   Escherichia coli NOT DETECTED NOT DETECTED   Klebsiella aerogenes NOT DETECTED NOT DETECTED   Klebsiella oxytoca NOT DETECTED NOT DETECTED   Klebsiella pneumoniae NOT DETECTED NOT DETECTED   Proteus species NOT DETECTED NOT DETECTED   Salmonella species NOT DETECTED NOT DETECTED   Serratia marcescens NOT DETECTED NOT DETECTED   Haemophilus influenzae NOT DETECTED  NOT DETECTED   Neisseria meningitidis NOT DETECTED NOT DETECTED   Pseudomonas aeruginosa NOT DETECTED NOT DETECTED   Stenotrophomonas maltophilia NOT DETECTED NOT DETECTED   Candida albicans NOT DETECTED NOT DETECTED   Candida auris NOT DETECTED NOT DETECTED   Candida glabrata NOT DETECTED NOT DETECTED   Candida krusei NOT DETECTED NOT DETECTED   Candida parapsilosis NOT DETECTED NOT DETECTED   Candida tropicalis NOT DETECTED NOT DETECTED   Cryptococcus neoformans/gattii NOT DETECTED NOT DETECTED   Methicillin resistance mecA/C DETECTED (A) NOT DETECTED    Amy [name] 05/17/2024  3:03 PM

## 2024-05-17 NOTE — ED Notes (Signed)
 [name] Daring, the patient's sister [phone] is requesting an update.

## 2024-05-17 NOTE — Plan of Care (Addendum)
 PLAN OF CARE NOTE  Morning admission  Amy [name] is a 64 y.o. female with medical history significant of hypertension, hyperlipidemia, hypothyroidism who presented to emergency department with confusion and low blood pressure and poor p.o. intake.  Patient's noted to be more altered and confused than usual.  EMS was called and she was found to be hypotensive with blood pressures in the 60s.   Patient is being treated for sepsis due to UTI.  Presented with lactic acidosis, altered mental status, transient hypotension in the setting of infection.  - Continue ceftriaxone , vancomycin . - Resumed home irbesartan  given normal pressures. - Blood cultures growing CoNS. Will continue with vancomycin  for now given prior positive MRSA screen. Will repeat screen.   MDALA-GAUSI, GOLDEN PILLOW, MD 05/17/2024 4:20 PM

## 2024-05-17 NOTE — Progress Notes (Signed)
 PHARMACY NOTE:  ANTIMICROBIAL RENAL DOSAGE ADJUSTMENT  Current antimicrobial regimen includes a mismatch between antimicrobial dosage and estimated renal function.  As per policy approved by the Pharmacy & Therapeutics and Medical Executive Committees, the antimicrobial dosage will be adjusted accordingly.  Current antimicrobial dosage:  vancomycin  1000mg  q4h (eAUC 412, cmin 9.2, Scr 1.55)  Indication: bacteremia   Renal Function:  Estimated Creatinine Clearance: 30.3 mL/min (A) (by C-G formula based on SCr of 1.55 mg/dL (H)). []      On intermittent HD, scheduled: []      On CRRT    Antimicrobial dosage has been changed to:  vancomycin  1250mg  q24h (eAUC 516, Scr 1.55)   Additional comments:   Thank you for allowing pharmacy to be a part of this patient's care.  Leonor GORMAN Bash, [facility] 05/17/2024 3:47 PM

## 2024-05-17 NOTE — ED Notes (Signed)
Pt assisted with use of bedpan

## 2024-05-17 NOTE — Inpatient Diabetes Management (Signed)
 Inpatient Diabetes Program Recommendations  AACE/ADA: New Consensus Statement on Inpatient Glycemic Control   Target Ranges:  Prepandial:   less than 140 mg/dL      Peak postprandial:   less than 180 mg/dL (1-2 hours)      Critically ill patients:  140 - 180 mg/dL   Lab Results  Component Value Date   GLUCAP 212 (H) 03/22/2024   HGBA1C 6.9 (H) 11/10/2023    Latest Reference Range & Units 05/16/24 18:41 05/17/24 07:29  Glucose 70 - 99 mg/dL 796 (H) 848 (H)   Review of Glycemic Control  Diabetes history: DM2  Outpatient Diabetes medications:  Dexcom G7 Tresiba  8 units daily Farxiga  10mg  daily Trulicity  3mg  weekly Humalog  0-30 units TID.   Current orders for Inpatient glycemic control: None   Inpatient Diabetes Program Recommendations:   Please consider starting Novolog  0-9 units q4hrs.   Thanks,  Lavanda Search, RN, MSN, [facility]  Inpatient Diabetes Coordinator  Pager [phone] (8a-5p)

## 2024-05-17 NOTE — ED Notes (Signed)
 Pt given another warm blanket.

## 2024-05-17 NOTE — H&P (Signed)
 " History and Physical    Amy [name] FMW:[id number] DOB: Apr 07, 1961 DOA: 05/16/2024  PCP: Sigrid Deidra Fox, MD   Chief Complaint: Altered mental status  HPI: Amy [name] is a 64 y.o. female with medical history significant of hypertension, hyperlipidemia, hypothyroidism who presented to emergency department with confusion and low blood pressure and poor p.o. intake.  Patient's noted to be more altered and confused than usual.  EMS was called and she was found to be hypotensive with blood pressures in the 60s.  She was given IV fluids and transported to the ER for further assessment on arrival she was hypotensive requiring resuscitation.  Labs were obtained which showed creatinine 2.1 baseline 1.5, bicarb 14, sodium 147, WBC 11.1, hemoglobin 13.5, respiratory viral panel was negative, lactic acid 4.1 urinalysis concerning for infection.  Patient was given IV fluids and admitted for further workup.  She was started vancomycin  and ceftriaxone .  Previous cultures were assessed she was found to grow MRSA.   Review of Systems: Review of Systems  Constitutional:  Positive for chills, fever and malaise/fatigue.  HENT: Negative.    Eyes: Negative.   Respiratory: Negative.    Cardiovascular: Negative.   Gastrointestinal: Negative.   Genitourinary: Negative.   Musculoskeletal: Negative.   Skin: Negative.   Neurological: Negative.   Endo/Heme/Allergies: Negative.   Psychiatric/Behavioral: Negative.       As per HPI otherwise 10 point review of systems negative.   Allergies[1]  Past Medical History:  Diagnosis Date   Arthritis    Asthma    Blind    Blindness and low vision    left eye glass eye,  legally blind in right eye   Diabetes mellitus    Diabetic neuropathy (HCC)    Hyperlipidemia    Hypertension    Hypothyroidism    Intractable nausea and vomiting 03/06/2018   Spinal stenosis     Past Surgical History:  Procedure Laterality Date   ABDOMINAL HYSTERECTOMY  1990    BREAST BIOPSY Left    BREAST BIOPSY Right    BREAST BIOPSY Right 08/19/2022   US  RT BREAST BX W LOC DEV 1ST LESION IMG BX SPEC US  GUIDE 08/19/2022 [facility]MO[facility]   CESAREAN SECTION  1987, 1990   x 2   CHOLECYSTECTOMY  2008   COLONOSCOPY WITH PROPOFOL  N/A 05/05/2023   Procedure: COLONOSCOPY WITH PROPOFOL ;  Surgeon: Rollin Dover, MD;  Location: [facility];  Service: Gastroenterology;  Laterality: N/A;   ENUCLEATION Bilateral 09/15/1998   EYE SURGERY  2016   fitting artificial eye   POLYPECTOMY  05/05/2023   Procedure: POLYPECTOMY;  Surgeon: Rollin Dover, MD;  Location: [facility];  Service: Gastroenterology;;     reports that she quit smoking about 32 years ago. Her smoking use included cigarettes. She has never used smokeless tobacco. She reports that she does not drink alcohol  and does not use drugs.  Family History  Problem Relation Age of Onset   Diabetes Sister    Glaucoma Sister    Hypertension Sister    Healthy Mother    Healthy Father    Stroke Brother    Heart attack Paternal Aunt    Breast cancer Neg Hx     Prior to Admission medications  Medication Sig Start Date End Date Taking? Authorizing Provider  albuterol  (PROVENTIL  HFA;VENTOLIN  HFA) 108 (90 BASE) MCG/ACT inhaler Inhale 2 puffs into the lungs every 4 (four) hours as needed for wheezing or shortness of breath. For short   Yes [provider]  allopurinol  (ZYLOPRIM ) 300 MG tablet Take 300 mg by mouth 2 (two) times daily. 11/23/20  Yes [provider]  aspirin  EC 81 MG tablet Take 81 mg by mouth daily.   Yes [provider]  celecoxib  (CELEBREX ) 200 MG capsule Take 200 mg by mouth 2 (two) times daily. 02/14/19  Yes [provider]  cetirizine (ZYRTEC) 10 MG tablet Take 10 mg by mouth at bedtime.   Yes [provider]  dapagliflozin  propanediol (FARXIGA ) 10 MG TABS tablet Take 1 tablet (10 mg total) by mouth daily. 09/21/23  Yes Shamleffer, Ibtehal Jaralla, MD  docusate  sodium (COLACE) 100 MG capsule Take 100 mg by mouth 2 (two) times daily. 10/26/22  Yes [provider]  Dulaglutide  (TRULICITY ) 3 MG/0.5ML SOAJ Inject 3 mg as directed once a week. Patient taking differently: Inject 3 mg as directed once a week. On fridays 09/21/23  Yes Shamleffer, Ibtehal Jaralla, MD  esomeprazole  (NEXIUM ) 40 MG capsule Take 1 capsule (40 mg total) by mouth daily. 07/10/20  Yes Bensimhon, Toribio SAUNDERS, MD  fluconazole  (DIFLUCAN ) 150 MG tablet Take 1 tablet (150 mg total) by mouth daily. 03/13/24  Yes Fayette Bodily, MD  fluticasone  (FLONASE ) 50 MCG/ACT nasal spray Place 2 sprays into both nostrils daily. 12/16/22  Yes [provider]  gabapentin  (NEURONTIN ) 400 MG capsule Take 400 mg by mouth 2 (two) times daily. 08/10/21  Yes [provider]  hydrOXYzine  (ATARAX /VISTARIL ) 25 MG tablet Take 0.5 tablets (12.5 mg total) by mouth every 8 (eight) hours as needed for itching. 12/05/20  Yes Christopher Savannah, PA-C  insulin  degludec (TRESIBA  FLEXTOUCH) 200 UNIT/ML FlexTouch Pen Inject 8 Units into the skin daily at 6 (six) AM. Patient taking differently: Inject 8 Units into the skin at bedtime. 03/23/23  Yes Shamleffer, Ibtehal Jaralla, MD  insulin  lispro (HUMALOG ) 100 UNIT/ML KwikPen MAX DAILY 30 UNITS Patient taking differently: Inject 0-30 Units into the skin 3 (three) times daily. PER SLIDING SCALE MAX DAILY 30 UNITS 09/21/23  Yes Shamleffer, Ibtehal Jaralla, MD  levothyroxine  (SYNTHROID ) 125 MCG tablet Take 125 mcg by mouth daily before breakfast.   Yes [provider]  nitrofurantoin , macrocrystal-monohydrate, (MACROBID ) 100 MG capsule Take 100 mg by mouth 2 (two) times daily. 04/15/24  Yes [provider]  olmesartan (BENICAR) 40 MG tablet Take 40 mg by mouth daily. 02/01/23  Yes [provider]  polyethylene glycol powder (MIRALAX ) 17 GM/SCOOP powder Take 17 g by mouth daily as needed for moderate constipation. 08/03/23  Yes [provider]  simvastatin  (ZOCOR ) 20 MG tablet Take 1 tablet (20 mg total) by mouth every evening. 08/06/20  Yes Levora Reyes SAUNDERS, MD  Alcohol  Swabs  PADS Use prior to any injections to clean your skin 03/07/24   Juvenal Harlene PENNER, DO  blood glucose meter kit and supplies Dispense based on patient and insurance preference. Use up to four times daily as directed. (FOR ICD-10 E10.9, E11.9). 08/06/20   Levora Reyes SAUNDERS, MD  Blood Glucose Monitoring Suppl (PRODIGY AUTOCODE BLOOD GLUCOSE) w/Device KIT 1 Device by Does not apply route daily in the afternoon. 09/21/23   Shamleffer, Ibtehal Jaralla, MD  Continuous Blood Gluc Sensor (DEXCOM G7 SENSOR) MISC 1 Device by Does not apply route as directed. 10/19/21   Shamleffer, Ibtehal Jaralla, MD  EMBRACE TALK GLUCOSE TEST test strip USE TO CHECK BLOOD SUGARS FOUR TIMES A DAY BEFORE MEALS AND AT BEDTIME DX E11.29 07/29/20   Levora Reyes SAUNDERS, MD  glucose blood (ONETOUCH VERIO)  test strip Check blood sugar 1-2 times daily 09/21/23   Shamleffer, Donell Cardinal, MD  Insulin  Pen Needle 32G X 4 MM MISC 1 Device by Does not apply route in the morning, at noon, in the evening, and at bedtime. 09/21/23   Shamleffer, Donell Cardinal, MD  Prodigy Twist Top Lancets 28G MISC USE TO CHECK BLOOD SUGARS 10/02/20   [provider]    Physical Exam: Vitals:   05/16/24 1830 05/16/24 2226 05/16/24 2243 05/17/24 0335  BP: 103/62 136/73 136/73 (!) 147/80  Pulse:   70 71  Resp: 18 13 16 15   Temp:   (!) 96 F (35.6 C) 97.7 F (36.5 C)  TempSrc:  Oral  Oral  SpO2: 98%  97% 100%   Physical Exam Constitutional:      Appearance: She is normal weight.  HENT:     Head: Normocephalic.     Nose: Nose normal.     Mouth/Throat:     Mouth: Mucous membranes are moist.     Pharynx: Oropharynx is clear.  Eyes:     Extraocular Movements: Extraocular movements intact.     Conjunctiva/sclera: Conjunctivae normal.     Pupils: Pupils are equal, round, and reactive to light.   Cardiovascular:     Rate and Rhythm: Normal rate and regular rhythm.     Pulses: Normal pulses.     Heart sounds: Normal heart sounds.  Pulmonary:     Effort: Pulmonary effort is normal.     Breath sounds: Normal breath sounds.  Abdominal:     General: Abdomen is flat. Bowel sounds are normal.     Palpations: Abdomen is soft.  Musculoskeletal:        General: Normal range of motion.     Cervical back: Normal range of motion.  Skin:    General: Skin is warm.     Capillary Refill: Capillary refill takes less than 2 seconds.  Neurological:     General: No focal deficit present.     Mental Status: She is alert.  Psychiatric:        Mood and Affect: Mood normal.        Labs on Admission: I have personally reviewed the patients's labs and imaging studies.  Assessment/Plan Principal Problem:   Urinary tract infection   # Acute infectious encephalopathy # Sepsis secondary to urinary tract infection - Found to have lactic acidosis -Responded well to IV fluids  Plan: Continue vancomycin  and ceftriaxone   # History of gout-continue allopurinol   # CAD-continue aspirin   # Chronic pain-continue gabapentin   #Hypertension-continue irbesartan   # Hypothyroidism-continue levothyroxine   # Hyperlipidemia-continue simvastatin    Admission status: Inpatient Med-Surg  Certification: The appropriate patient status for this patient is INPATIENT. Inpatient status is judged to be reasonable and necessary in order to provide the required intensity of service to ensure the patient's safety. The patient's presenting symptoms, physical exam findings, and initial radiographic and laboratory data in the context of their chronic comorbidities is felt to place them at high risk for further clinical deterioration. Furthermore, it is not anticipated that the patient will be medically stable for discharge from the hospital within 2 midnights of admission.   * I certify that at the point of  admission it is my clinical judgment that the patient will require inpatient hospital care spanning beyond 2 midnights from the point of admission due to high intensity of service, high risk for further deterioration and high frequency of surveillance required.DEWAINE Lamar Dess MD [facility] If 7PM-7AM, please contact night-coverage [email or web address]  05/17/2024, 4:36 AM        [1]  Allergies Allergen Reactions   Penicillins Hives and Swelling   Tramadol  Nausea And Vomiting    Intense nausea   Vicodin [Hydrocodone -Acetaminophen ] Nausea And Vomiting   Codeine Nausea Only    Other reaction(s): Unknown   Iodine-131 Hives   Iohexol Hives    Pt developed itching and hives along with nasal congestion; needs 13 hour premeds for future studies, Onset Date: [id number]    Lisinopril Cough    Other reaction(s): Unknown   Sulfa Antibiotics Nausea And Vomiting   "

## 2024-05-17 NOTE — Progress Notes (Signed)
 Pharmacy Antibiotic Note  Amy [name] is a 63 y.o. female admitted on 05/16/2024 with bacteremia.  Pharmacy has been consulted for vancomycin  dosing.  Received vancomycin  1500 mg x 1 @ 2102 today.  Plan: Vancomycin  1g IV q 48 hrs.  Est AUC 517 w/ Scr 2.15. F/u cultures, renal function and clinical course.    Temp (24hrs), Avg:96.8 F (36 C), Min:96 F (35.6 C), Max:97.7 F (36.5 C)  Recent Labs  Lab 05/16/24 1841 05/16/24 2055 05/17/24 0009  WBC 11.1*  --   --   CREATININE 2.15*  --   --   LATICACIDVEN  --  4.1* 2.1*    CrCl cannot be calculated (Unknown ideal weight.).    Allergies[1]    Thank you for allowing pharmacy to be a part of this patients care.  Harlene Barlow, [employer], BCCP Clinical Pharmacist  05/17/2024 5:01 AM   [facility] pharmacy phone numbers are listed on [email or web address]      [1]  Allergies Allergen Reactions   Penicillins Hives and Swelling   Tramadol  Nausea And Vomiting    Intense nausea   Vicodin [Hydrocodone -Acetaminophen ] Nausea And Vomiting   Codeine Nausea Only    Other reaction(s): Unknown   Iodine-131 Hives   Iohexol Hives    Pt developed itching and hives along with nasal congestion; needs 13 hour premeds for future studies, Onset Date: [id number]    Lisinopril Cough    Other reaction(s): Unknown   Sulfa Antibiotics Nausea And Vomiting

## 2024-05-18 DIAGNOSIS — N3000 Acute cystitis without hematuria: Secondary | ICD-10-CM | POA: Diagnosis not present

## 2024-05-18 LAB — CBC
HCT: 41.1 % (ref 36.0–46.0)
Hemoglobin: 13.7 g/dL (ref 12.0–15.0)
MCH: 27.7 pg (ref 26.0–34.0)
MCHC: 33.3 g/dL (ref 30.0–36.0)
MCV: 83 fL (ref 80.0–100.0)
Platelets: 145 K/uL — ABNORMAL LOW (ref 150–400)
RBC: 4.95 MIL/uL (ref 3.87–5.11)
RDW: 16.3 % — ABNORMAL HIGH (ref 11.5–15.5)
WBC: 8 K/uL (ref 4.0–10.5)
nRBC: 0 % (ref 0.0–0.2)

## 2024-05-18 LAB — GLUCOSE, CAPILLARY
Glucose-Capillary: 160 mg/dL — ABNORMAL HIGH (ref 70–99)
Glucose-Capillary: 202 mg/dL — ABNORMAL HIGH (ref 70–99)
Glucose-Capillary: 183 mg/dL — ABNORMAL HIGH (ref 70–99)
Glucose-Capillary: 191 mg/dL — ABNORMAL HIGH (ref 70–99)
Glucose-Capillary: 203 mg/dL — ABNORMAL HIGH (ref 70–99)
Glucose-Capillary: 142 mg/dL — ABNORMAL HIGH (ref 70–99)

## 2024-05-18 LAB — BASIC METABOLIC PANEL WITH GFR
Anion gap: 19 — ABNORMAL HIGH (ref 5–15)
BUN: 26 mg/dL — ABNORMAL HIGH (ref 8–23)
CO2: 19 mmol/L — ABNORMAL LOW (ref 22–32)
Calcium: 9.6 mg/dL (ref 8.9–10.3)
Chloride: 107 mmol/L (ref 98–111)
Creatinine, Ser: 1.45 mg/dL — ABNORMAL HIGH (ref 0.44–1.00)
GFR, Estimated: 40 mL/min — ABNORMAL LOW
Glucose, Bld: 190 mg/dL — ABNORMAL HIGH (ref 70–99)
Potassium: 4.3 mmol/L (ref 3.5–5.1)
Sodium: 145 mmol/L (ref 135–145)

## 2024-05-18 LAB — HEMOGLOBIN A1C
Hgb A1c MFr Bld: 9.8 % — ABNORMAL HIGH (ref 4.8–5.6)
Mean Plasma Glucose: 234.56 mg/dL

## 2024-05-18 MED ORDER — ENOXAPARIN SODIUM 40 MG/0.4ML IJ SOSY
40.0000 mg | PREFILLED_SYRINGE | INTRAMUSCULAR | Status: DC
  Administered 2024-05-19 – 2024-05-23 (×5): 40 mg via SUBCUTANEOUS
  Filled 2024-05-18 (×5): qty 0.4

## 2024-05-18 MED ORDER — INSULIN ASPART 100 UNIT/ML IJ SOLN
0.0000 [IU] | Freq: Three times a day (TID) | INTRAMUSCULAR | Status: DC
  Administered 2024-05-18: 1 [IU] via SUBCUTANEOUS
  Administered 2024-05-18: 2 [IU] via SUBCUTANEOUS
  Administered 2024-05-18 – 2024-05-19 (×2): 1 [IU] via SUBCUTANEOUS
  Administered 2024-05-19: 4 [IU] via SUBCUTANEOUS
  Administered 2024-05-20: 2 [IU] via SUBCUTANEOUS
  Administered 2024-05-20: 3 [IU] via SUBCUTANEOUS
  Administered 2024-05-21 (×2): 1 [IU] via SUBCUTANEOUS
  Administered 2024-05-21 – 2024-05-22 (×2): 2 [IU] via SUBCUTANEOUS
  Administered 2024-05-22: 4 [IU] via SUBCUTANEOUS
  Administered 2024-05-22 – 2024-05-23 (×2): 3 [IU] via SUBCUTANEOUS
  Filled 2024-05-18: qty 1
  Filled 2024-05-18: qty 2
  Filled 2024-05-18 (×2): qty 1
  Filled 2024-05-18 (×2): qty 2
  Filled 2024-05-18: qty 3
  Filled 2024-05-18: qty 1
  Filled 2024-05-18: qty 3
  Filled 2024-05-18: qty 4
  Filled 2024-05-18: qty 6
  Filled 2024-05-18: qty 2
  Filled 2024-05-18: qty 1

## 2024-05-18 MED ORDER — INSULIN ASPART 100 UNIT/ML IJ SOLN
0.0000 [IU] | Freq: Every day | INTRAMUSCULAR | Status: DC
  Administered 2024-05-19 – 2024-05-20 (×2): 4 [IU] via SUBCUTANEOUS
  Administered 2024-05-21 – 2024-05-22 (×2): 2 [IU] via SUBCUTANEOUS
  Filled 2024-05-18: qty 3
  Filled 2024-05-18 (×2): qty 2
  Filled 2024-05-18: qty 4

## 2024-05-18 NOTE — Progress Notes (Signed)
 New Admission Note:    Arrival Method: ED stretcher Mental Orientation: AAOx4 Telemetry: NA Assessment: Completed Skin: See flowsheet IV: RAC Pain: 0/10 Tubes: n/a Safety Measures: Safety Fall Prevention Plan has been given, discussed and signed Admission: Completed [facility] Orientation: Patient has been orientated to the room, unit and staff.  Family: none at bedside   Orders have been reviewed and implemented. Will continue to monitor the patient. Call light has been placed within reach and bed alarm has been activated.

## 2024-05-18 NOTE — Plan of Care (Signed)
  Problem: Education: Goal: Ability to describe self-care measures that may prevent or decrease complications (Diabetes Survival Skills Education) will improve Outcome: Progressing   Problem: Coping: Goal: Ability to adjust to condition or change in health will improve Outcome: Progressing   Problem: Health Behavior/Discharge Planning: Goal: Ability to identify and utilize available resources and services will improve Outcome: Progressing

## 2024-05-18 NOTE — Progress Notes (Signed)
 " Progress Note   [name] FMW:[id number] DOB: Jun 25, 1960 DOA: 05/16/2024     2 DOS: the patient was seen and examined on 05/18/2024    Brief hospital course: Amy [name] is a 64 y.o. female with medical history significant of hypertension, hyperlipidemia, hypothyroidism who presented to emergency department with confusion and low blood pressure and poor p.o. intake.  Patient's noted to be more altered and confused than usual.  EMS was called and she was found to be hypotensive with blood pressures in the 60s.    Patient is being treated for sepsis due to UTI.  Presented with lactic acidosis, altered mental status, transient hypotension in the setting of infection.  Assessment and Plan:  Sepsis due to UTI Evidenced by lactic acidosis, altered mental status, transient hypotension in setting of infection.  Was started on broad-spectrum antibiotics with ceftriaxone , vancomycin  for given prior positive MRSA screen. Sepsis is resolving. - Continue ceftriaxone . - DC vancomycin  as source of sepsis most likely UTI.  Urinary tract infection UA was suggestive of infection. Urine cultures are growing gram-negative rods. - Continue ceftriaxone . - Follow-up on identification of organism and sensitivities.  Acute infectious encephalopathy Resolved. - Continue antibiotics.  Hypertension - Continue home irbesartan .  Blindness - Continue supportive care.  Deep tissue pressure injury gluteal region Present on admission. - Wound care consulted. - Continue wound care per wound team recs.  Deconditioning - Awaiting evaluation by PT/OT.  Chronic conditions  Gout: Continue allopurinol  CAD: Continue aspirin  Hypothyroidism: Continue home Synthroid  Hyperlipidemia: Continue simvastatin        Subjective: Patient reports she is completely blind.  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  She states she is feeling better today but remains concerned about  her ability to function at home. She denies diarrhea.  Physical Exam: BP 109/60 (BP Location: Right Arm)   Pulse 73   Temp 98.7 F (37.1 C)   Resp 16   Wt 67.6 kg Comment: Wt from 04/15/24  SpO2 100%   BMI 30.10 kg/m    General: Alert, oriented X3  Eyes: Absent left eye Oral cavity: moist mucous membranes  Head: Atraumatic, normocephalic  Neck: supple  Chest: clear to auscultation. No crackles, no wheezes  CVS: S1,S2 RRR. No murmurs  Abd: No distention, soft, non-tender. No masses palpable  Extr: No edema   MSK: No joint deformities or swelling  Neurological: Grossly intact.    Data Reviewed:    Latest Ref Rng & Units 05/18/2024    6:41 AM 05/17/2024    7:29 AM 05/16/2024    6:41 PM  CBC  WBC 4.0 - 10.5 K/uL 8.0  10.2  11.1   Hemoglobin 12.0 - 15.0 g/dL 86.2  87.5  86.4   Hematocrit 36.0 - 46.0 % 41.1  38.3  42.8   Platelets 150 - 400 K/uL 145  141  145       Latest Ref Rng & Units 05/18/2024    6:41 AM 05/17/2024    7:29 AM 05/16/2024    6:41 PM  BMP  Glucose 70 - 99 mg/dL 809  848  796   BUN 8 - 23 mg/dL 26  37  44   Creatinine 0.44 - 1.00 mg/dL 8.54  8.44  7.84   Sodium 135 - 145 mmol/L 145  141  147   Potassium 3.5 - 5.1 mmol/L 4.3  4.2  4.0   Chloride 98 - 111 mmol/L 107  106  113   CO2 22 - 32 mmol/L 19  19  14    Calcium  8.9 - 10.3 mg/dL 9.6  9.0  8.4      Family Communication: n/a  Disposition: Status is: Inpatient Remains inpatient appropriate because: Remains on IV antibiotics due to sepsis  DVT PPx: SQ Lovenox       Author: MDALA-GAUSI, Felisha Claytor AGATHA, MD 05/18/2024 2:31 PM  For on call review [email or web address].    "

## 2024-05-18 NOTE — Consult Note (Signed)
 WOC Nurse Consult Note: Reason for Consult: unstageable gluteal wounds  Wound type: Deep Tissue Pressure Injury Pressure Injury POA: Yes Measurement: see nursing flow sheet Wound bed: dark purple non blanchable  Drainage (amount, consistency, odor) see nursing flow sheet Periwound: intact  Dressing procedure/placement/frequency: Cleanse sacrum and buttock with saline, pat dry. Cover with single laye of xeroform and top with foam. Change daily  Turn and reposition per hospital policy    Re consult if needed, will not follow at this time. Thanks  Ayeden Gladman [employer], RN,CWOCN, CNS, [employer] [phone]

## 2024-05-19 DIAGNOSIS — N3000 Acute cystitis without hematuria: Secondary | ICD-10-CM | POA: Diagnosis not present

## 2024-05-19 LAB — CULTURE, BLOOD (ROUTINE X 2)

## 2024-05-19 LAB — CBC
HCT: 38.8 % (ref 36.0–46.0)
Hemoglobin: 12.7 g/dL (ref 12.0–15.0)
MCH: 27.2 pg (ref 26.0–34.0)
MCHC: 32.7 g/dL (ref 30.0–36.0)
MCV: 83.1 fL (ref 80.0–100.0)
Platelets: 126 K/uL — ABNORMAL LOW (ref 150–400)
RBC: 4.67 MIL/uL (ref 3.87–5.11)
RDW: 16.3 % — ABNORMAL HIGH (ref 11.5–15.5)
WBC: 6.4 K/uL (ref 4.0–10.5)
nRBC: 0 % (ref 0.0–0.2)

## 2024-05-19 LAB — BASIC METABOLIC PANEL WITH GFR
Anion gap: 13 (ref 5–15)
BUN: 18 mg/dL (ref 8–23)
CO2: 22 mmol/L (ref 22–32)
Calcium: 9.2 mg/dL (ref 8.9–10.3)
Chloride: 111 mmol/L (ref 98–111)
Creatinine, Ser: 1.27 mg/dL — ABNORMAL HIGH (ref 0.44–1.00)
GFR, Estimated: 47 mL/min — ABNORMAL LOW
Glucose, Bld: 102 mg/dL — ABNORMAL HIGH (ref 70–99)
Potassium: 3.9 mmol/L (ref 3.5–5.1)
Sodium: 147 mmol/L — ABNORMAL HIGH (ref 135–145)

## 2024-05-19 LAB — GLUCOSE, CAPILLARY
Glucose-Capillary: 146 mg/dL — ABNORMAL HIGH (ref 70–99)
Glucose-Capillary: 183 mg/dL — ABNORMAL HIGH (ref 70–99)
Glucose-Capillary: 318 mg/dL — ABNORMAL HIGH (ref 70–99)
Glucose-Capillary: 316 mg/dL — ABNORMAL HIGH (ref 70–99)

## 2024-05-19 LAB — URINE CULTURE: Culture: >=100000 — AB

## 2024-05-19 MED ORDER — IRBESARTAN 75 MG PO TABS
150.0000 mg | ORAL_TABLET | Freq: Every day | ORAL | Status: DC
  Administered 2024-05-19: 150 mg via ORAL
  Filled 2024-05-19 (×2): qty 2

## 2024-05-19 MED ORDER — CEFADROXIL 500 MG PO CAPS
500.0000 mg | ORAL_CAPSULE | Freq: Two times a day (BID) | ORAL | Status: DC
  Administered 2024-05-19 – 2024-05-23 (×8): 500 mg via ORAL
  Filled 2024-05-19 (×8): qty 1

## 2024-05-19 NOTE — Progress Notes (Signed)
 Klebsiella came back sensitive to cefazolin. Ok to optimize ceftriaxone  to cefadroxil  to complete a total of 7d course per Dr. Mdala-Gausi.  Sergio Batch, PharmD, BCIDP, AAHIVP, CPP Infectious Disease Pharmacist 05/19/2024 12:43 PM

## 2024-05-19 NOTE — Evaluation (Signed)
 Physical Therapy Evaluation Patient Details Name: Amy [name] MRN: [id number] DOB: Aug 23, 1960 Today's Date: 05/19/2024  History of Present Illness  64 yo F adm 05/16/24 with AMS, hypotension, UTI, sepsis. PMhx: blindness, DM, asthma, neuropathy, nephropathy, HLD, OSA, HTN  Clinical Impression  Pt admitted with above. Pt poor historian reporting she lives with her mother and son. Per RN, pt's sister reports she lives with her son, but he works. Pt blind in both eyes and demo's both functional and cognitive impairments. Pt requiring modA for mobility with max directional verbal cues due to impaired vision and balance. Pt also presenting with disorientation to place, date, and situation with no carry over despite re-orientation x5 during PT eval. Pt with decreased insight to safety, deficits, situation and is unsafe to return home alone. Pt at significant fall risk. Pt to benefit from inpatient rehab program < 3 hrs a day to allow for increased time to achieve safe mod I level of function with visual impairment prior to transition home. If pt were to d/c home at this time pt needs 24/7 assist and significant visual impairment functional training. Acute PT to cont to follow.      If plan is discharge home, recommend the following: A lot of help with walking and/or transfers;A lot of help with bathing/dressing/bathroom;Supervision due to cognitive status;Help with stairs or ramp for entrance;Assist for transportation;Assistance with cooking/housework;Direct supervision/assist for medications management;Direct supervision/assist for financial management   Can travel by private vehicle   Yes    Equipment Recommendations None recommended by PT  Recommendations for Other Services       Functional Status Assessment Patient has had a recent decline in their functional status and demonstrates the ability to make significant improvements in function in a reasonable and predictable amount of time.      Precautions / Restrictions Precautions Precautions: Fall Precaution/Restrictions Comments: pt blind. 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 Restrictions Weight Bearing Restrictions Per Provider Order: No      Mobility  Bed Mobility Overal bed mobility: Needs Assistance Bed Mobility: Rolling, Sidelying to Sit Rolling: Min assist Sidelying to sit: Mod assist       General bed mobility comments: max tactile directional cues due to impaired vision, pt with good use of UEs once hand placed on railing, continuous directional verbal cues to complete task, increased time    Transfers Overall transfer level: Needs assistance Equipment used: 1 person hand held assist Transfers: Sit to/from Stand, Bed to chair/wheelchair/BSC Sit to Stand: Min assist   Step pivot transfers: Mod assist       General transfer comment: PT helped pt place L hand on arm rest of chair to her left, provided R HHA for directional cues and physical assist for transfer to recliner on L. Pt with minA to power up more so due to impaired cognition and vision. Once in standing, PT provided modA via max directional verbal cues and stability during transfer due to impaired vision    Ambulation/Gait               General Gait Details: limited to step pvt to chair  Stairs            Wheelchair Mobility     Tilt Bed    Modified Rankin (Stroke Patients Only)       Balance Overall balance assessment: Needs assistance Sitting-balance support: Feet supported, Single extremity supported Sitting balance-Leahy Scale: Fair Sitting balance - Comments: requires single UE support   Standing  balance support: Bilateral upper extremity supported, During functional activity, Reliant on assistive device for balance Standing balance-Leahy Scale: Poor Standing balance comment: reliant on external support                             Pertinent Vitals/Pain Pain Assessment Pain Assessment: No/denies  pain    Home Living Family/patient expects to be discharged to:: Skilled nursing facility                   Additional Comments: pt poor historian, per RN, pt's sister reports her son lives with her and she has a [facility] aide that comes in a couple times a week. Pt reports living with her mother and son who both help her out. Pt states its a 2 story house. Due to noted confusion and impaired memory suspect PLOF and home set up are not accurate    Prior Function Prior Level of Function : Needs assist             Mobility Comments: pt reports using cane and RW, no driving ADLs Comments: pt reports getting help with bathing/dressing, pt feeds self once set up with food     Extremity/Trunk Assessment   Upper Extremity Assessment Upper Extremity Assessment: Generalized weakness (noted impaired dexterity/fine motor coordination with bilat hands, pt reports bilat hand numbness/neuropathy)    Lower Extremity Assessment Lower Extremity Assessment: Generalized weakness (reports bilat foot numbness/neuropathy)    Cervical / Trunk Assessment Cervical / Trunk Assessment: Kyphotic  Communication   Communication Communication: Impaired Factors Affecting Communication: Hearing impaired    Cognition Arousal: Alert Behavior During Therapy: WFL for tasks assessed/performed   PT - Cognitive impairments: Orientation, Awareness, Memory, Attention, Initiation, Sequencing   Orientation impairments: Place, Time, Situation                   PT - Cognition Comments: pt repeatedly saying [place], [employer], in a tall building. when asked where she was. Pt re-oriented x 5 t/o session with no carry over. Pt able to follow simple commands majority of time with tactile cues. Pt with noted short term memory, decreased insight to deficits, and situation. 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, VSS    Exercises     Assessment/Plan    PT Assessment Patient needs continued PT services  PT Problem List Decreased strength;Decreased activity tolerance;Decreased balance;Decreased mobility;Decreased coordination;Decreased cognition       PT Treatment Interventions DME instruction;Gait training;Functional mobility training;Therapeutic activities;Therapeutic exercise;Balance training    PT Goals (Current goals can be found in the Care Plan section)  Acute Rehab PT Goals Patient Stated Goal: didn't state PT Goal Formulation: With patient Time For Goal Achievement: 06/02/24 Potential to Achieve Goals: Good    Frequency Min 1X/week     Co-evaluation               AM-PAC PT 6 Clicks Mobility  Outcome Measure Help needed turning from your back to your side while in a flat bed without using bedrails?: A Lot Help needed moving from lying on your back to sitting on the side of a flat bed without using bedrails?: A Lot Help needed moving to and from a bed to a  chair (including a wheelchair)?: A Lot Help needed standing up from a chair using your arms (e.g., wheelchair or bedside chair)?: A Lot Help needed to walk in hospital room?: Total Help needed climbing 3-5 steps with a railing? : Total 6 Click Score: 10    End of Session Equipment Utilized During Treatment: Gait belt Activity Tolerance: Patient tolerated treatment well Patient left: in chair;with call bell/phone within reach;with chair alarm set Nurse Communication: Mobility status PT Visit Diagnosis: Unsteadiness on feet (R26.81);Muscle weakness (generalized) (M62.81);Difficulty in walking, not elsewhere classified (R26.2)    Time: 9185-9160 PT Time Calculation (min) (ACUTE ONLY): 25 min   Charges:   PT Evaluation $PT Eval Low Complexity: 1 Low PT  Treatments $Therapeutic Activity: 8-22 mins PT General Charges $$ ACUTE PT VISIT: 1 Visit         Norene Ames, PT, DPT Acute Rehabilitation Services Secure chat preferred Office #: [phone]   Norene CHRISTELLA Ames 05/19/2024, 9:01 AM

## 2024-05-19 NOTE — TOC Progression Note (Signed)
 PT rec for SNF noted. Per MD, pt likely stable for dc tomorrow. Pt currently disoriented to situation. Attempted to reach pt's son and sister, no answer. Will continue efforts to reach family and confirm dc plan.   Julien Das, MSW, LCSW [phone] (coverage)

## 2024-05-19 NOTE — Evaluation (Signed)
 Occupational Therapy Evaluation Patient Details Name: Amy [name] MRN: [id number] DOB: 1960/06/19 Today's Date: 05/19/2024   History of Present Illness   64 yo F adm 05/16/24 with AMS, hypotension, UTI, sepsis. PMhx: blindness, DM, asthma, neuropathy, nephropathy, HLD, OSA, HTN     Clinical Impressions Pt is poor historian, unable to verify home living and PLOF. Pt currently presents with need of up to total A for ADL engagement and up to Mod A for functional transfers. Pt is primarily limited by impaired vision and balance, generalized weakness, unsteadiness on feet, and impaired cognition. Pt demonstrates significant impairments in reasoning, insight, problem solving, and sequencing. Impaired safe, independent engagement in ADL tasks. OT to continue to follow Pt acutely to facilitate progress towards goals. Patient will benefit from continued inpatient follow up therapy, <3 hours/day.      If plan is discharge home, recommend the following:   A lot of help with walking and/or transfers;A lot of help with bathing/dressing/bathroom;Assistance with cooking/housework;Assistance with feeding;Direct supervision/assist for medications management;Direct supervision/assist for financial management;Assist for transportation;Help with stairs or ramp for entrance;Supervision due to cognitive status     Functional Status Assessment   Patient has had a recent decline in their functional status and demonstrates the ability to make significant improvements in function in a reasonable and predictable amount of time.     Equipment Recommendations   Other (comment) (defer to next venue)     Recommendations for Other Services         Precautions/Restrictions   Precautions Precautions: Fall Recall of Precautions/Restrictions: Impaired Precaution/Restrictions Comments: pt blind. 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 Pt required direct HOHA to place hands on RW appropriately. Pt able to transition to requiring Min A for static standing balance with RW while linens changed.      Balance Overall balance assessment: Needs assistance Sitting-balance support: Feet supported, Bilateral upper extremity supported Sitting balance-Leahy Scale: Fair     Standing balance support: Bilateral upper extremity supported, During functional activity, Reliant on assistive device for balance Standing balance-Leahy Scale: Poor Standing balance comment: Dependent on RW and external support                           ADL either performed or assessed with clinical judgement   ADL Overall ADL's : Needs assistance/impaired Eating/Feeding: Minimal assistance   Grooming: Moderate assistance;Sitting   Upper Body Bathing: Moderate assistance;Sitting   Lower Body Bathing: Total assistance   Upper Body Dressing : Minimal assistance   Lower Body Dressing: Total assistance   Toilet Transfer: Moderate assistance;Stand-pivot;Cueing for sequencing;Cueing for safety;BSC/3in1;Rolling walker (2 wheels)   Toileting- Clothing Manipulation and Hygiene: Total assistance         General ADL Comments: Pt with episode of urinary incontinence requiring assist to change gown, socks, and linen on chair     Vision Baseline Vision/History: 2 Legally blind Ability to See in Adequate Light: 4 Severely impaired  Additional Comments: 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 and is completely blind in R eye     Perception         Praxis         Pertinent Vitals/Pain Pain  Assessment Pain Assessment: No/denies pain     Extremity/Trunk Assessment Upper Extremity Assessment Upper Extremity Assessment: Generalized weakness (Pt endoreses neuropathy)   Lower Extremity Assessment Lower Extremity Assessment: Defer to PT evaluation   Cervical / Trunk Assessment Cervical / Trunk Assessment: Kyphotic   Communication Communication Communication: Impaired Factors Affecting Communication: Hearing impaired   Cognition Arousal: Alert Behavior During Therapy: WFL for tasks assessed/performed Cognition: Cognition impaired, No family/caregiver present to determine baseline   Orientation impairments: Time, Situation Awareness: Intellectual awareness impaired, Online awareness impaired Memory impairment (select all impairments): Short-term memory, Working memory Attention impairment (select first level of impairment): Sustained attention Executive functioning impairment (select all impairments): Initiation, Organization, Sequencing, Reasoning, Problem solving OT - Cognition Comments: Cognitive deficits observed throughout session. Only oriented to self and place. Decreased insight into deficits and safety awareness. Decreased executive functioning skills limiting safe independent engagement in ADLs.                 Following commands: Impaired Following commands impaired: Follows one step commands with increased time     Cueing  General Comments   Cueing Techniques: Verbal cues;Tactile cues  VSS on RA. Pt pleasant and agreeable to therapy   Exercises     Shoulder Instructions      Home Living Family/patient expects to be discharged to:: Skilled nursing facility Living Arrangements: Alone                               Additional Comments: pt poor historian, per RN, pt's sister reports her son lives with her and she has a [facility] aide that comes in on Sundays for 30 minutes in the morning and then 4 hours after church. Reported to PT that aide  comes in multiple times a week.  Due to noted confusion and impaired memory suspect PLOF and home set up are not accurate      Prior Functioning/Environment Prior Level of Function : Needs assist             Mobility Comments: pt reports using cane and RW, no driving ADLs Comments: pt reports getting help with bathing/dressing, pt feeds self once set up with food    OT Problem List: Decreased strength;Decreased activity tolerance;Impaired balance (sitting and/or standing);Impaired vision/perception;Decreased safety awareness;Decreased knowledge of use of DME or AE;Decreased knowledge of precautions;Decreased cognition   OT Treatment/Interventions: Self-care/ADL training;Therapeutic exercise;Energy conservation;DME and/or AE instruction;Cognitive remediation/compensation;Visual/perceptual remediation/compensation;Patient/family education;Balance training      OT Goals(Current goals can be found in the care plan section)   Acute Rehab OT Goals Patient Stated Goal: to get better OT Goal Formulation: With patient Time For Goal Achievement: 06/02/24 Potential to Achieve Goals: Good ADL Goals Pt Will Perform Eating: with set-up;sitting Pt Will Perform Grooming: with set-up;sitting Pt Will Perform Lower Body Dressing: with mod assist;sit to/from stand Pt Will Transfer to Toilet: with contact guard assist;bedside commode Pt/caregiver will Perform Home Exercise Program: Increased strength;Both right and left upper extremity;With written HEP provided;With minimal assist   OT Frequency:  Min 2X/week    Co-evaluation              AM-PAC OT 6 Clicks Daily Activity     Outcome Measure Help from  another person eating meals?: A Little Help from another person taking care of personal grooming?: A Lot Help from another person toileting, which includes using toliet, bedpan, or urinal?: Total Help from another person bathing (including washing, rinsing, drying)?: Total Help from  another person to put on and taking off regular upper body clothing?: A Little Help from another person to put on and taking off regular lower body clothing?: Total 6 Click Score: 11   End of Session Equipment Utilized During Treatment: Rolling walker (2 wheels)  Activity Tolerance: Patient tolerated treatment well Patient left: in chair;with call bell/phone within reach;with chair alarm set  OT Visit Diagnosis: Unsteadiness on feet (R26.81);Muscle weakness (generalized) (M62.81);Low vision, both eyes (H54.2);Other symptoms and signs involving cognitive function                Time: 8941-8877 OT Time Calculation (min): 24 min Charges:  OT General Charges $OT Visit: 1 Visit OT Evaluation $OT Eval Low Complexity: 1 Low OT Treatments $Self Care/Home Management : 8-22 mins  Maurilio CROME, OTR/L.  [facility] Acute Rehabilitation  Office: [phone]   Maurilio PARAS Vonceil [place] 05/19/2024, 1:11 PM

## 2024-05-19 NOTE — Progress Notes (Signed)
 " Progress Note   [name] FMW:[id number] DOB: 08/07/1960 DOA: 05/16/2024     3 DOS: the patient was seen and examined on 05/19/2024    Brief hospital course: SHAKIRA LOS is a 64 y.o. female with medical history significant of hypertension, hyperlipidemia, hypothyroidism who presented to emergency department with confusion and low blood pressure and poor p.o. intake.  Patient's noted to be more altered and confused than usual.  EMS was called and she was found to be hypotensive with blood pressures in the 60s.    Patient is being treated for sepsis due to UTI.  Presented with lactic acidosis, altered mental status, transient hypotension in the setting of infection.  Assessment and Plan:  Sepsis due to UTI Evidenced by lactic acidosis, altered mental status, transient hypotension in setting of infection.  Was started on broad-spectrum antibiotics with ceftriaxone , vancomycin  for given prior positive MRSA screen. Sepsis is resolving. - Continue cephalosporin (transitioned from ceftriaxone  to cefadroxil ). - DCed vancomycin  as source of sepsis most likely UTI.  Urinary tract infection UA was suggestive of infection. Urine cultures are growing pansensitive Klebsiella - Continue cephalosporin (transitioned from ceftriaxone  to cefadroxil ).  Hypernatremia Likely due to volume depletion.  - Increased fluid intake encouraged.   CKD stage III Baseline creatinine approximately 1.5. Patient is at her baseline. - Avoid nephrotoxins.  Acute infectious encephalopathy Resolved. - Continue antibiotics.  Hypertension - Reduced dose of ARB for now, given hypotension on 05/18/24.  T2DM On Jardiance, liraglutide, Tresiba , insulin  sliding scale at home. - Holding home Jardiance given UTI. - Sliding scale insulin . - Will resume home liraglutide after discharge.  Blindness - Continue supportive care.  Deep tissue pressure injury gluteal region Present on admission. - Wound care  consulted. - Continue wound care per wound team recs.  Deconditioning Seen by PT/OT - SNF recommended.  Chronic conditions  Gout: Continue allopurinol  CAD: Continue aspirin  Hypothyroidism: Continue home Synthroid  Hyperlipidemia: Continue simvastatin        Subjective: Patient has now new complaints. Encouraged to drink more fluids, given increase in serum sodium. Of note, patient was hypotensive overnight.   Physical Exam: BP 115/61 (BP Location: Right Arm)   Pulse 62   Temp (!) 97.2 F (36.2 C) (Axillary)   Resp 19   Wt 67.6 kg Comment: Wt from 04/15/24  SpO2 99%   BMI 30.10 kg/m    General: Alert, oriented X3  Eyes: Absent left eye Oral cavity: moist mucous membranes  Head: Atraumatic, normocephalic  Neck: supple  Chest: clear to auscultation. No crackles, no wheezes  CVS: S1,S2 RRR. No murmurs  Abd: No distention, soft, non-tender. No masses palpable  Extr: No edema   MSK: No joint deformities or swelling  Neurological: Grossly intact.    Data Reviewed:    Latest Ref Rng & Units 05/19/2024    5:25 AM 05/18/2024    6:41 AM 05/17/2024    7:29 AM  CBC  WBC 4.0 - 10.5 K/uL 6.4  8.0  10.2   Hemoglobin 12.0 - 15.0 g/dL 87.2  86.2  87.5   Hematocrit 36.0 - 46.0 % 38.8  41.1  38.3   Platelets 150 - 400 K/uL 126  145  141       Latest Ref Rng & Units 05/19/2024    5:25 AM 05/18/2024    6:41 AM 05/17/2024    7:29 AM  BMP  Glucose 70 - 99 mg/dL 897  809  848   BUN 8 - 23 mg/dL 18  26  37   Creatinine 0.44 - 1.00 mg/dL 8.72  8.54  8.44   Sodium 135 - 145 mmol/L 147  145  141   Potassium 3.5 - 5.1 mmol/L 3.9  4.3  4.2   Chloride 98 - 111 mmol/L 111  107  106   CO2 22 - 32 mmol/L 22  19  19    Calcium  8.9 - 10.3 mg/dL 9.2  9.6  9.0      Family Communication: n/a  Disposition: Status is: Inpatient Remains inpatient appropriate because: Pending discharge to SNF  DVT PPx: SQ Lovenox       Author: MDALA-GAUSI, Allen Egerton AGATHA, MD 05/19/2024 3:56 PM  For on call  review [email or web address].    "

## 2024-05-19 NOTE — Plan of Care (Signed)

## 2024-05-20 DIAGNOSIS — N3000 Acute cystitis without hematuria: Secondary | ICD-10-CM | POA: Diagnosis not present

## 2024-05-20 LAB — BASIC METABOLIC PANEL WITH GFR
Anion gap: 9 (ref 5–15)
BUN: 18 mg/dL (ref 8–23)
CO2: 25 mmol/L (ref 22–32)
Calcium: 9 mg/dL (ref 8.9–10.3)
Chloride: 108 mmol/L (ref 98–111)
Creatinine, Ser: 1.15 mg/dL — ABNORMAL HIGH (ref 0.44–1.00)
GFR, Estimated: 53 mL/min — ABNORMAL LOW
Glucose, Bld: 166 mg/dL — ABNORMAL HIGH (ref 70–99)
Potassium: 3.7 mmol/L (ref 3.5–5.1)
Sodium: 142 mmol/L (ref 135–145)

## 2024-05-20 LAB — GLUCOSE, CAPILLARY
Glucose-Capillary: 147 mg/dL — ABNORMAL HIGH (ref 70–99)
Glucose-Capillary: 244 mg/dL — ABNORMAL HIGH (ref 70–99)
Glucose-Capillary: 280 mg/dL — ABNORMAL HIGH (ref 70–99)
Glucose-Capillary: 318 mg/dL — ABNORMAL HIGH (ref 70–99)

## 2024-05-20 NOTE — Progress Notes (Signed)
 " Progress Note   [name] FMW:[id number] DOB: [birth year] DOA: 05/16/2024     4 DOS: the patient was seen and examined on 05/20/2024    Brief hospital course: Amy [name] is a 64 y.o. female with medical history significant of hypertension, hyperlipidemia, hypothyroidism who presented to emergency department with confusion and low blood pressure and poor p.o. intake.  Patient's noted to be more altered and confused than usual.  EMS was called and she was found to be hypotensive with blood pressures in the 60s.    Patient is being treated for sepsis due to UTI.  Presented with lactic acidosis, altered mental status, transient hypotension in the setting of infection.  Assessment and Plan:  Sepsis due to UTI Evidenced by lactic acidosis, altered mental status, transient hypotension in setting of infection.  Was started on broad-spectrum antibiotics with ceftriaxone , vancomycin  for given prior positive MRSA screen. Sepsis is resolving. - Continue cephalosporin (transitioned from ceftriaxone  to cefadroxil ). - DCed vancomycin  as source of sepsis most likely UTI.  Urinary tract infection UA was suggestive of infection. Urine cultures are growing pansensitive Klebsiella - Continue cephalosporin (transitioned from ceftriaxone  to cefadroxil ).  Hypotension in patient with history of hypertension Patient has had ongoing hypotension. - Hold home ARB  CKD stage III Baseline creatinine approximately 1.5. Patient is at her baseline. - Avoid nephrotoxins.  Acute infectious encephalopathy Resolved. - Continue antibiotics.  T2DM On Jardiance, liraglutide, Tresiba , insulin  sliding scale at home. - Holding home Jardiance given UTI. - Sliding scale insulin . - Will resume home liraglutide after discharge.  Blindness - Continue supportive care.  Deep tissue pressure injury gluteal region Present on admission. - Wound care consulted. - Continue wound care per wound team  recs.  Deconditioning Seen by PT/OT - SNF recommended.  Chronic conditions  Gout: Continue allopurinol  CAD: Continue aspirin  Hypothyroidism: Continue home Synthroid  Hyperlipidemia: Continue simvastatin        Subjective: Patient is feeling well today.  Physical Exam: BP (!) 95/49 (BP Location: Right Arm)   Pulse 64   Temp 98 F (36.7 C)   Resp 19   Wt 67.6 kg Comment: Wt from 04/15/24  SpO2 98%   BMI 30.10 kg/m    General: Alert, oriented X3  Eyes: Absent left eye Oral cavity: moist mucous membranes  Head: Atraumatic, normocephalic  Neck: supple  Chest: clear to auscultation. No crackles, no wheezes  CVS: S1,S2 RRR. No murmurs  Abd: No distention, soft, non-tender. No masses palpable  Extr: No edema   MSK: No joint deformities or swelling  Neurological: Grossly intact.    Data Reviewed:    Latest Ref Rng & Units 05/19/2024    5:25 AM 05/18/2024    6:41 AM 05/17/2024    7:29 AM  CBC  WBC 4.0 - 10.5 K/uL 6.4  8.0  10.2   Hemoglobin 12.0 - 15.0 g/dL 87.2  86.2  87.5   Hematocrit 36.0 - 46.0 % 38.8  41.1  38.3   Platelets 150 - 400 K/uL 126  145  141       Latest Ref Rng & Units 05/20/2024    4:26 AM 05/19/2024    5:25 AM 05/18/2024    6:41 AM  BMP  Glucose 70 - 99 mg/dL 833  897  809   BUN 8 - 23 mg/dL 18  18  26    Creatinine 0.44 - 1.00 mg/dL 8.84  8.72  8.54   Sodium 135 - 145 mmol/L 142  147  145  Potassium 3.5 - 5.1 mmol/L 3.7  3.9  4.3   Chloride 98 - 111 mmol/L 108  111  107   CO2 22 - 32 mmol/L 25  22  19    Calcium  8.9 - 10.3 mg/dL 9.0  9.2  9.6      Family Communication: n/a  Disposition: Status is: Inpatient Remains inpatient appropriate because: Pending discharge to SNF  DVT PPx: SQ Lovenox       Author: MDALA-GAUSI, Tazia Illescas AGATHA, MD 05/20/2024 1:04 PM  For on call review [email or web address].    "

## 2024-05-20 NOTE — TOC Initial Note (Addendum)
 Transition of Care [facility]) - Initial/Assessment Note    Patient Details  Name: Amy [name] MRN: [id number] Date of Birth: [birth year]  [facility] CM/SW Contact:    Lendia Dais, LCSWA Phone Number: 05/20/2024, 8:53 AM  Clinical Narrative: Pt is oriented to self and place only. Pt is from with nephew who has been her caregiver for the last 2-3 months. Pt need assist w/ ADL's bathing and meds. Pt does not drive and friend Richardson does grocery shopping and checked on the pt occasionally.  CSW completed intitial assess w/ pt's sister Amy.  Amy stated that the pt was confused prior to being admitted and would call Amy stating that she was sitting outside on a cold park bench when she was at home.  Amy is not the legal HCPOA but is the next of kin. Pt has seen PCP in the last year, has the DME of a rollator, bs commode, and CPAP. Pt has a hx of STR at [facility].  CSW informed Amy of of PT recs of SNF. Amy was agreeable and mentioned that the pt voiced wanted to attend a ALF. Amy inquired about [facility]. CSW informed Amy that those were ALF's who did not provide STR services.  CSW stated they can provide a list of facilities and send out referrals to see who would be able to offer a bed. Amy stated she had no preference of SNF.  Referrals have been sent out in the HUB. Bed offers pending.  CSW will continue to monitor for offers.                   Expected Discharge Plan: Skilled Nursing Facility Barriers to Discharge: Continued Medical Work up   Patient Goals and CMS Choice Patient states their goals for this hospitalization and ongoing recovery are:: pt disoriented          Expected Discharge Plan and Services In-house Referral: Clinical Social Work     Living arrangements for the past 2 months: Skilled Nursing Facility                                      Prior Living Arrangements/Services Living  arrangements for the past 2 months: Skilled Nursing Facility Lives with:: Relatives Patient language and need for interpreter reviewed:: Yes Do you feel safe going back to the place where you live?:  (pt disoriented)      Need for Family Participation in Patient Care: Yes (Comment) Care giver support system in place?: Yes (comment) Current home services: DME Criminal Activity/Legal Involvement Pertinent to Current Situation/Hospitalization: No - Comment as needed  Activities of Daily Living   ADL Screening (condition at time of admission) Independently performs ADLs?: No Does the patient have a NEW difficulty with bathing/dressing/toileting/self-feeding that is expected to last >3 days?: Yes (Initiates electronic notice to provider for possible OT consult) Does the patient have a NEW difficulty with getting in/out of bed, walking, or climbing stairs that is expected to last >3 days?: No Does the patient have a NEW difficulty with communication that is expected to last >3 days?: No Is the patient deaf or have difficulty hearing?: Yes Does the patient have difficulty seeing, even when wearing glasses/contacts?: Yes Does the patient have difficulty concentrating, remembering, or making decisions?: No  Permission Sought/Granted Permission sought to share information with : Family Supports Permission granted to share  information with : No  Share Information with NAME: Amy [name]     Permission granted to share info w Relationship: Sister  Permission granted to share info w Contact Information: [phone]  Emotional Assessment Appearance:: Appears stated age Attitude/Demeanor/Rapport: Unable to Assess Affect (typically observed): Unable to Assess Orientation: : Oriented to Self, Oriented to Place Alcohol  / Substance Use: Not Applicable Psych Involvement: No (comment)  Admission diagnosis:  Hypernatremia [E87.0] Urinary tract infection [N39.0] AKI (acute kidney injury)  [N17.9] Patient Active Problem List   Diagnosis Date Noted   Urinary tract infection 05/16/2024   MRSA bacteremia 03/04/2024   ARF (acute renal failure) 11/10/2023   Abnormal breathing 03/07/2023   Nasal dryness 06/22/2021   Dysphagia 04/07/2021   Gastro-esophageal reflux disease without esophagitis 04/07/2021   History of colonic polyps 04/07/2021   Bilateral impacted cerumen 02/25/2021   Sensorineural hearing loss (SNHL), bilateral 02/25/2021   Type 2 diabetes mellitus with diabetic polyneuropathy, with long-term current use of insulin  (HCC) 06/29/2020   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/29/2020   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, with long-term current use of insulin  (HCC) 06/29/2020   Type 2 diabetes mellitus with hyperglycemia, with long-term current use of insulin  (HCC) 06/29/2020   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 03/31/2020   Pre-operative cardiovascular examination 02/18/2019   Septic bursitis of elbow, right 10/30/2018   CRI (chronic renal insufficiency), stage 3 (moderate) 07/26/2018   Morbid obesity (HCC) 07/26/2018   Intractable nausea and vomiting 03/06/2018   Right leg pain 12/19/2017   Leukocytosis 12/19/2017   Benign positional vertigo 12/19/2017   Weakness 12/12/2017   Laryngopharyngeal reflux (LPR) 10/19/2017   Post-nasal drainage 10/19/2017   Acute sinusitis 10/19/2017   Degeneration of lumbar intervertebral disc 07/29/2017   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, left 03/08/2017   Obstructive sleep apnea on CPAP 02/09/2017   Chronic diastolic heart failure (HCC) 11/08/2016   Near syncope 01/30/2016   SOB (shortness of breath)    CAP (community acquired pneumonia) 09/12/2015   Hypoxia 09/12/2015   Essential hypertension 07/05/2015    Hypotension 07/05/2015   Diabetes mellitus with neurological manifestations (HCC) 11/04/2014   Hyperlipidemia LDL goal <70 11/04/2014   Spinal stenosis, multilevel 11/04/2014   Hyperkalemia 11/01/2014   Hypoglycemia 08/09/2014   Type 2 diabetes mellitus with complication, with long-term current use of insulin  (HCC) 08/08/2014   Elevated troponin 08/08/2014   Nausea vomiting and diarrhea 08/08/2014   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   Dizziness 08/06/2012   Orthostatic hypotension 08/06/2012   Hypernatremia 08/06/2012   Acute gastroenteritis 08/13/2011   Gastroparesis 08/13/2011   Low back pain 08/13/2011   Oral thrush 08/13/2011   Gastroenteritis 05/23/2011   Dehydration 05/23/2011   Blindness 05/23/2011   DM type 1, not at goal, causing eye disease (HCC) 05/23/2011   Asthma 05/23/2011   Diarrhea 05/23/2011   Vomiting 05/23/2011   Hypothyroidism 05/23/2011   Diabetic neuropathy (HCC) 05/23/2011   Diabetic nephropathy (HCC) 05/23/2011   PCP:  Sigrid Deidra Fox, MD Pharmacy:   [facility] [place], [place] - [address] [place] [address] Phone: [phone] Fax: [phone]  [facility] - [facility] [address] [place] [address] Phone: [phone] Fax: [phone]  Social Drivers of Health (SDOH) Social History: SDOH Screenings   Food Insecurity: No Food Insecurity (05/19/2024)  Housing: Low Risk (05/19/2024)  Transportation Needs: No Transportation Needs (05/19/2024)  Utilities: Not At Risk (05/19/2024)  Social Connections: Patient Declined (05/19/2024)  Tobacco Use: Medium Risk (05/16/2024)   SDOH Interventions:     Readmission Risk Interventions     No data to display

## 2024-05-20 NOTE — Progress Notes (Signed)
 CSW left Medicare.gov list at bedside w/ pt and the pt's sister [name]. Pt and [name] has questions regarding to transferring to an ALF post SNF. CSW stated that the SNF they chose can help with that process. CSW mentioned that ALF's can be an outpocket expense unless they have LTC insurance. Pt stated understanding.  CSW will continue to monitor for bed choice.

## 2024-05-20 NOTE — Progress Notes (Signed)
 Mobility Specialist: Progress Note   05/20/24 1544  Mobility  Activity Pivoted/transferred from chair to bed  Level of Assistance Moderate assist, patient does 50-74%  Assistive Device Front wheel walker  Activity Response Tolerated well  Mobility Referral Yes  Mobility visit 1 Mobility  Mobility Specialist Start Time (ACUTE ONLY) 1410  Mobility Specialist Stop Time (ACUTE ONLY) 1430  Mobility Specialist Time Calculation (min) (ACUTE ONLY) 20 min    Pt received in chair. ModA for STS and step pivot to [facility]. BM successful, completed 3x STS to perform pericare with totA. Afterwards, took backward steps towards EOB. ModA for sit to supine to assist BLE. Left in bed with all needs met, call bell in reach.   Ileana Lute Mobility Specialist Please contact via SecureChat or Rehab office at [phone]

## 2024-05-20 NOTE — [place] FL2 (Signed)
 " [place]  MEDICAID FL2 LEVEL OF CARE FORM     IDENTIFICATION  Patient Name: Amy [name] Birthdate: 1960-11-06 Sex: female Admission Date (Current Location): 05/16/2024  [facility] [place] Number:      Facility and Address:  [facility] [place], [address] [place], [place] [address]      Provider Number: [id number]  Attending Physician Name and Address:  Jearlean Harpin Agat*  Relative Name and Phone Number:  Delores [name] Ahumada, Emergency Contact  [phone]    Current Level of Care: Hospital Recommended Level of Care: Skilled Nursing Facility Prior Approval Number:    Date Approved/Denied:   PASRR Number: [id number]  Discharge Plan: SNF    Current Diagnoses: Patient Active Problem List   Diagnosis Date Noted   Urinary tract infection 05/16/2024   MRSA bacteremia 03/04/2024   ARF (acute renal failure) 11/10/2023   Abnormal breathing 03/07/2023   Nasal dryness 06/22/2021   Dysphagia 04/07/2021   Gastro-esophageal reflux disease without esophagitis 04/07/2021   History of colonic polyps 04/07/2021   Bilateral impacted cerumen 02/25/2021   Sensorineural hearing loss (SNHL), bilateral 02/25/2021   Type 2 diabetes mellitus with diabetic polyneuropathy, with long-term current use of insulin  (HCC) 06/29/2020   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/29/2020   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, with long-term current use of insulin  (HCC) 06/29/2020   Type 2 diabetes mellitus with hyperglycemia, with long-term current use of insulin  (HCC) 06/29/2020   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 03/31/2020   Pre-operative cardiovascular examination 02/18/2019   Septic bursitis of elbow, right 10/30/2018   CRI (chronic renal insufficiency), stage 3 (moderate) 07/26/2018   Morbid obesity (HCC) 07/26/2018   Intractable  nausea and vomiting 03/06/2018   Right leg pain 12/19/2017   Leukocytosis 12/19/2017   Benign positional vertigo 12/19/2017   Weakness 12/12/2017   Laryngopharyngeal reflux (LPR) 10/19/2017   Post-nasal drainage 10/19/2017   Acute sinusitis 10/19/2017   Degeneration of lumbar intervertebral disc 07/29/2017   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, left 03/08/2017   Obstructive sleep apnea on CPAP 02/09/2017   Chronic diastolic heart failure (HCC) 11/08/2016   Near syncope 01/30/2016   SOB (shortness of breath)    CAP (community acquired pneumonia) 09/12/2015   Hypoxia 09/12/2015   Essential hypertension 07/05/2015   Hypotension 07/05/2015   Diabetes mellitus with neurological manifestations (HCC) 11/04/2014   Hyperlipidemia LDL goal <70 11/04/2014   Spinal stenosis, multilevel 11/04/2014   Hyperkalemia 11/01/2014   Hypoglycemia 08/09/2014   Type 2 diabetes mellitus with complication, with long-term current use of insulin  (HCC) 08/08/2014   Elevated troponin 08/08/2014   Nausea vomiting and diarrhea 08/08/2014   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   Dizziness 08/06/2012   Orthostatic hypotension 08/06/2012   Hypernatremia 08/06/2012   Acute gastroenteritis 08/13/2011   Gastroparesis 08/13/2011   Low back pain 08/13/2011   Oral thrush 08/13/2011   Gastroenteritis 05/23/2011   Dehydration 05/23/2011   Blindness 05/23/2011   DM type 1, not at goal, causing eye disease (HCC) 05/23/2011   Asthma 05/23/2011   Diarrhea 05/23/2011   Vomiting 05/23/2011   Hypothyroidism 05/23/2011   Diabetic neuropathy (HCC) 05/23/2011   Diabetic nephropathy (HCC) 05/23/2011    Orientation RESPIRATION BLADDER Height & Weight  Self, Place  Normal Continent Weight: 149 lb 0.5 oz (67.6 kg) (Wt from 04/15/24) Height:     BEHAVIORAL SYMPTOMS/MOOD NEUROLOGICAL BOWEL  NUTRITION STATUS      Continent Diet (See dc summary)  AMBULATORY STATUS COMMUNICATION OF NEEDS Skin   Extensive Assist Verbally Other (Comment) (Erythema)                       Personal Care Assistance Level of Assistance  Bathing, Feeding, Dressing Bathing Assistance: Maximum assistance (total assist) Feeding assistance: Independent Dressing Assistance: Maximum assistance (total assist)     Functional Limitations Info  Sight, Hearing, Speech Sight Info: Impaired   Speech Info: Adequate    SPECIAL CARE FACTORS FREQUENCY  PT (By licensed PT), OT (By licensed OT)     PT Frequency: 5x/wl OT Frequency: 5x/wk            Contractures Contractures Info: Not present    Additional Factors Info  Code Status, Allergies, Insulin  Sliding Scale Code Status Info: FULL Allergies Info: Penicillins  Tramadol   Vicodin (Hydrocodone -acetaminophen )  Codeine  Iodine-131  Iohexol  Lisinopril  Sulfa Antibiotics   Insulin  Sliding Scale Info: Novolog        Current Medications (05/20/2024):  This is the current hospital active medication list Current Facility-Administered Medications  Medication Dose Route Frequency Provider Last Rate Last Admin   acetaminophen  (TYLENOL ) tablet 650 mg  650 mg Oral Q6H PRN Dena Charleston, MD   650 mg at 05/18/24 1135   Or   acetaminophen  (TYLENOL ) suppository 650 mg  650 mg Rectal Q6H PRN Dena Charleston, MD       allopurinol  (ZYLOPRIM ) tablet 300 mg  300 mg Oral Q1200 Dena Charleston, MD   300 mg at 05/20/24 0848   aspirin  EC tablet 81 mg  81 mg Oral Daily Dena Charleston, MD   81 mg at 05/20/24 0848   cefadroxil  (DURICEF) capsule 500 mg  500 mg Oral BID Pham, Minh Q, RPH-CPP   500 mg at 05/20/24 0848   enoxaparin  (LOVENOX ) injection 40 mg  40 mg Subcutaneous Q24H Pham, Minh Q, RPH-CPP   40 mg at 05/20/24 9152   gabapentin  (NEURONTIN ) capsule 400 mg  400 mg Oral BID Dena Charleston, MD   400 mg at 05/20/24 0848   insulin  aspart (novoLOG ) injection 0-5  Units  0-5 Units Subcutaneous QHS Sundil, Subrina, MD   4 Units at 05/19/24 2026   insulin  aspart (novoLOG ) injection 0-6 Units  0-6 Units Subcutaneous TID WC Sundil, Subrina, MD   4 Units at 05/19/24 1826   levothyroxine  (SYNTHROID ) tablet 125 mcg  125 mcg Oral QAC breakfast Dena Charleston, MD   125 mcg at 05/20/24 9485   ondansetron  (ZOFRAN ) tablet 4 mg  4 mg Oral Q6H PRN Dena Charleston, MD       Or   ondansetron  (ZOFRAN ) injection 4 mg  4 mg Intravenous Q6H PRN Dorrell, Robert, MD       simvastatin  (ZOCOR ) tablet 20 mg  20 mg Oral QPM Dena Charleston, MD   20 mg at 05/19/24 1826     Discharge Medications: Please see discharge summary for a list of discharge medications.  Relevant Imaging Results:  Relevant Lab Results:   Additional Information SSN: [id number] [place]     "

## 2024-05-20 NOTE — Plan of Care (Signed)

## 2024-05-20 NOTE — Progress Notes (Signed)
" °   05/20/24 2244  BiPAP/CPAP/SIPAP  BiPAP/CPAP/SIPAP Pt Type Adult  Mask Type Nasal mask  Respiratory Rate 16 breaths/min  EPAP 8 cmH2O  Patient Home Machine No  Patient Home Mask No  Patient Home Tubing No  Auto Titrate No    "

## 2024-05-20 NOTE — Inpatient Diabetes Management (Signed)
 Inpatient Diabetes Program Recommendations  AACE/ADA: New Consensus Statement on Inpatient Glycemic Control (2015)  Target Ranges:  Prepandial:   less than 140 mg/dL      Peak postprandial:   less than 180 mg/dL (1-2 hours)      Critically ill patients:  140 - 180 mg/dL   Lab Results  Component Value Date   GLUCAP 244 (H) 05/20/2024   HGBA1C 9.8 (H) 05/18/2024    Review of Glycemic Control  Diabetes history: DM 2 Outpatient Diabetes medications: Dexcom G7, Farxiga  10 mg Daily, Trulicity  3 mg QFriday, Tresiba  8 units qhs, Humalog  0-30 units tid per sliding scale Current orders for Inpatient glycemic control:  Novolog  0-6 units tid + hs  A1c 9.8 on 1/3  Inpatient Diabetes Program Recommendations:   Spoke with pt at bedside regarding A1c and glucose control at home. Pt has children but they are not always available to assist her, pt reports she has home health to help fill her medications however they are not there for every time she needs to administer medications or insulin . Pt reports she cannot see and I witnessed her feeling around eating her lunch tray. Pt also is without dentition to chew. Pt is saying she is wanting to go into assisted living but She has not started any type of process for that.   -   Concerns for pt due to possible lack of home assistance with medication and lack of ability and availability of family  Thanks,  Clotilda Bull RN, MSN, BC-ADM Inpatient Diabetes Coordinator Team Pager [phone] (8a-5p)

## 2024-05-20 NOTE — Progress Notes (Signed)
 Mobility Specialist: Progress Note   05/20/24 1500  Mobility  Activity Pivoted/transferred from bed to chair  Level of Assistance Moderate assist, patient does 50-74%  Assistive Device Front wheel walker  Activity Response Tolerated well  Mobility Referral Yes  Mobility visit 1 Mobility  Mobility Specialist Start Time (ACUTE ONLY) 1055  Mobility Specialist Stop Time (ACUTE ONLY) 1115  Mobility Specialist Time Calculation (min) (ACUTE ONLY) 20 min    Pt received in bed, agreeable to mobility session. Soiled in BM upon arrival. ModA for bed mobility to scoot EOB. Heavy modA for 3x STS to perform pericare with totA. Afterwards, completed step pivot to chair with max cues to guide. ModA+2 to help slide hips back in the chair. Left in chair with all needs met, call bell in reach. Chair alarm on.   Ileana Lute Mobility Specialist Please contact via SecureChat or Rehab office at [phone]

## 2024-05-21 DIAGNOSIS — N3000 Acute cystitis without hematuria: Secondary | ICD-10-CM | POA: Diagnosis not present

## 2024-05-21 LAB — BASIC METABOLIC PANEL WITH GFR
Anion gap: 11 (ref 5–15)
BUN: 18 mg/dL (ref 8–23)
CO2: 22 mmol/L (ref 22–32)
Calcium: 9.1 mg/dL (ref 8.9–10.3)
Chloride: 107 mmol/L (ref 98–111)
Creatinine, Ser: 1.27 mg/dL — ABNORMAL HIGH (ref 0.44–1.00)
GFR, Estimated: 47 mL/min — ABNORMAL LOW
Glucose, Bld: 204 mg/dL — ABNORMAL HIGH (ref 70–99)
Potassium: 3.8 mmol/L (ref 3.5–5.1)
Sodium: 141 mmol/L (ref 135–145)

## 2024-05-21 LAB — GLUCOSE, CAPILLARY
Glucose-Capillary: 196 mg/dL — ABNORMAL HIGH (ref 70–99)
Glucose-Capillary: 156 mg/dL — ABNORMAL HIGH (ref 70–99)
Glucose-Capillary: 217 mg/dL — ABNORMAL HIGH (ref 70–99)
Glucose-Capillary: 212 mg/dL — ABNORMAL HIGH (ref 70–99)

## 2024-05-21 NOTE — Inpatient Diabetes Management (Signed)
 Inpatient Diabetes Program Recommendations  AACE/ADA: New Consensus Statement on Inpatient Glycemic Control (2015)  Target Ranges:  Prepandial:   less than 140 mg/dL      Peak postprandial:   less than 180 mg/dL (1-2 hours)      Critically ill patients:  140 - 180 mg/dL   Lab Results  Component Value Date   GLUCAP 196 (H) 05/21/2024   HGBA1C 9.8 (H) 05/18/2024    Review of Glycemic Control  Latest Reference Range & Units 05/20/24 08:33 05/20/24 11:17 05/20/24 16:28 05/20/24 19:49 05/21/24 08:09  Glucose-Capillary 70 - 99 mg/dL 852 (H) 755 (H) 719 (H) 318 (H) 196 (H)   Diabetes history: DM 2 Outpatient Diabetes medications: Dexcom G7, Farxiga  10 mg Daily, Trulicity  3 mg QFriday, Tresiba  8 units qhs, Humalog  0-30 units tid per sliding scale Current orders for Inpatient glycemic control:  Novolog  0-6 units tid + hs  A1c 9.8 on 1/3  Note: glucose trends increase after PO intake  Inpatient Diabetes Program Recommendations:  -  consider Novolog  2 units tid meal coverage if eating >50% of meals  -   Concerns for pt to properly care for herself at home due to possible lack of home assistance with medication and lack of ability and availability of family.  Thanks,  Clotilda Bull RN, MSN, BC-ADM Inpatient Diabetes Coordinator Team Pager [phone] (8a-5p)

## 2024-05-21 NOTE — TOC Progression Note (Addendum)
 Transition of Care [facility]) - Progression Note    Patient Details  Name: Amy [name] MRN: [id number] Date of Birth: 08-31-1960  Transition of Care [facility]) CM/SW Contact  Lendia Dais, [place] Phone Number: 05/21/2024, 9:56 AM  Clinical Narrative: Pt does not have a legal guardian. The social worker in the pt's contacts assist with finances and resources as needed.   CSW spoke to pt's sister [name] who stated they would like to move forward with [facility], CSW notified Jon of [facility] who may be able to take her tomorrow if beds are available. [name] is pending, Auth ID [id number].  CSW will continue to monitor      Expected Discharge Plan: Skilled Nursing Facility Barriers to Discharge: Continued Medical Work up               Expected Discharge Plan and Services In-house Referral: Clinical Social Work     Living arrangements for the past 2 months: Skilled Nursing Facility                                       Social Drivers of Health (SDOH) Interventions SDOH Screenings   Food Insecurity: No Food Insecurity (05/19/2024)  Housing: Low Risk (05/19/2024)  Transportation Needs: No Transportation Needs (05/19/2024)  Utilities: Not At Risk (05/19/2024)  Social Connections: Patient Declined (05/19/2024)  Tobacco Use: Medium Risk (05/16/2024)    Readmission Risk Interventions     No data to display

## 2024-05-21 NOTE — Progress Notes (Signed)
 " Progress Note   [name] FMW:[id number] DOB: 21-Apr-1961 DOA: 05/16/2024     5 DOS: the patient was seen and examined on 05/21/2024    Brief hospital course: Amy [name] is a 64 y.o. female with medical history significant of hypertension, hyperlipidemia, hypothyroidism who presented to emergency department with confusion and low blood pressure and poor p.o. intake.  Patient's noted to be more altered and confused than usual.  EMS was called and she was found to be hypotensive with blood pressures in the 60s.    Patient is being treated for sepsis due to UTI.  Presented with lactic acidosis, altered mental status, transient hypotension in the setting of infection.  Assessment and Plan:  Sepsis due to UTI Evidenced by lactic acidosis, altered mental status, transient hypotension in setting of infection.  Was started on broad-spectrum antibiotics with ceftriaxone , vancomycin  for given prior positive MRSA screen. Sepsis is resolved. - Continue cephalosporin (transitioned from ceftriaxone  to cefadroxil ). - DCed vancomycin  as source of sepsis most likely UTI.  Urinary tract infection UA was suggestive of infection. Urine cultures are growing pansensitive Klebsiella - Continue cephalosporin (transitioned from ceftriaxone  to cefadroxil ) with end date 1/8.  Hypotension in patient with history of hypertension Patient has had ongoing hypotension. - Hold home ARB  CKD stage III Baseline creatinine approximately 1.5. Patient is at her baseline. - Avoid nephrotoxins.  Acute infectious encephalopathy Resolved. - Continue antibiotics.  T2DM On Jardiance, liraglutide, Tresiba , insulin  sliding scale at home. - Holding home Jardiance given UTI. - Sliding scale insulin . - Will resume home liraglutide after discharge.  Blindness - Continue supportive care.  Deep tissue pressure injury gluteal region Present on admission. - Wound care consulted. - Continue wound care per  wound team recs.  Deconditioning Seen by PT/OT - SNF recommended.  Placement pending.  Chronic conditions  Gout: Continue allopurinol  CAD: Continue aspirin  Hypothyroidism: Continue home Synthroid  Hyperlipidemia: Continue simvastatin        Subjective: Patient is feeling well today.  Physical Exam: BP 101/61 (BP Location: Right Arm)   Pulse 61   Temp 98.5 F (36.9 C)   Resp 19   Wt 67.6 kg Comment: Wt from 04/15/24  SpO2 100%   BMI 30.10 kg/m    General: Alert, oriented X3  Eyes: Absent left eye Oral cavity: moist mucous membranes  Head: Atraumatic, normocephalic  Neck: supple  Chest: clear to auscultation. No crackles, no wheezes  CVS: S1,S2 RRR. No murmurs  Abd: No distention, soft, non-tender. No masses palpable  Extr: No edema   MSK: No joint deformities or swelling  Neurological: Grossly intact.    Data Reviewed:    Latest Ref Rng & Units 05/19/2024    5:25 AM 05/18/2024    6:41 AM 05/17/2024    7:29 AM  CBC  WBC 4.0 - 10.5 K/uL 6.4  8.0  10.2   Hemoglobin 12.0 - 15.0 g/dL 87.2  86.2  87.5   Hematocrit 36.0 - 46.0 % 38.8  41.1  38.3   Platelets 150 - 400 K/uL 126  145  141       Latest Ref Rng & Units 05/21/2024    3:20 AM 05/20/2024    4:26 AM 05/19/2024    5:25 AM  BMP  Glucose 70 - 99 mg/dL 795  833  897   BUN 8 - 23 mg/dL 18  18  18    Creatinine 0.44 - 1.00 mg/dL 8.72  8.84  8.72   Sodium 135 - 145  mmol/L 141  142  147   Potassium 3.5 - 5.1 mmol/L 3.8  3.7  3.9   Chloride 98 - 111 mmol/L 107  108  111   CO2 22 - 32 mmol/L 22  25  22    Calcium  8.9 - 10.3 mg/dL 9.1  9.0  9.2      Family Communication: n/a  Disposition: Status is: Inpatient Remains inpatient appropriate because: Pending discharge to SNF  DVT PPx: SQ Lovenox       Author: MDALA-GAUSI, Ruchel Brandenburger AGATHA, MD 05/21/2024 1:53 PM  For on call review [email or web address].    "

## 2024-05-22 LAB — CULTURE, BLOOD (ROUTINE X 2): Culture: NO GROWTH

## 2024-05-22 LAB — GLUCOSE, CAPILLARY
Glucose-Capillary: 203 mg/dL — ABNORMAL HIGH (ref 70–99)
Glucose-Capillary: 330 mg/dL — ABNORMAL HIGH (ref 70–99)
Glucose-Capillary: 263 mg/dL — ABNORMAL HIGH (ref 70–99)
Glucose-Capillary: 222 mg/dL — ABNORMAL HIGH (ref 70–99)

## 2024-05-22 NOTE — Progress Notes (Signed)
 Physical Therapy Treatment Patient Details Name: Amy [name] MRN: [id number] DOB: 1960/09/17 Today's Date: 05/22/2024   History of Present Illness 64 yo F adm 05/16/24 with AMS, hypotension, UTI, sepsis. PMhx: blindness, DM, asthma, neuropathy, nephropathy, HLD, OSA, HTN    PT Comments  Pt admitted with above diagnosis. Pt needed mod assist of 2 and mod cues for all mobility. Pt requires +2 assist heavy assist at times.  Continue to recommend post acute rehab < 3 hours day.  Pt currently with functional limitations due to the deficits listed below (see PT Problem List). Pt will benefit from acute skilled PT to increase their independence and safety with mobility to allow discharge.       If plan is discharge home, recommend the following: A lot of help with walking and/or transfers;A lot of help with bathing/dressing/bathroom;Supervision due to cognitive status;Help with stairs or ramp for entrance;Assist for transportation;Assistance with cooking/housework;Direct supervision/assist for medications management;Direct supervision/assist for financial management   Can travel by private vehicle        Equipment Recommendations  None recommended by PT    Recommendations for Other Services       Precautions / Restrictions Precautions Precautions: Fall Recall of Precautions/Restrictions: Impaired Precaution/Restrictions Comments: pt blind. 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    Transfers Overall transfer level: Needs assistance Equipment used: Rolling walker (2 wheels) Transfers: Sit to/from Stand Sit to Stand: Mod assist, +2 physical assistance           General transfer comment: Pt  required Mod A x2 attempts to power up from bed. Pt required direct HOHA to place hands on RW appropriately. Noted pt with BM on bed pad therefore walked pt to sink and cleaned pt with total assist with pt needing mod assist to stand as she leans posteriorly and knees bilaterally weak. Pt holding onto sink and fatigued quickly and had to bring chair to her to sit.    Ambulation/Gait Ambulation/Gait assistance: Mod assist, +2 physical assistance Gait Distance (Feet): 6 Feet Assistive device: Rolling walker (2 wheels) Gait Pattern/deviations: Step-through pattern, Decreased stance time - left, Shuffle, Leaning posteriorly, Staggering left, Staggering right, Narrow base of support, Trunk flexed, Drifts right/left   Gait velocity interpretation: <1.31 ft/sec, indicative of household ambulator   General Gait Details: Pt very unsteady on her feet needing mod assist of 2 to ambulate a few steps.  Heavy posterior lean and bil knee instability noted.  relies on RW and external support   Stairs             Wheelchair Mobility     Tilt Bed    Modified Rankin (Stroke Patients Only)       Balance Overall balance assessment: Needs assistance Sitting-balance support: No upper extremity supported, Feet supported Sitting balance-Leahy Scale: Fair Sitting balance - Comments: requires single UE support   Standing balance support: Bilateral upper extremity supported, During functional activity Standing balance-Leahy Scale: Poor Standing balance comment: Dependent on RW and external support                            Communication Communication Communication:  Impaired Factors Affecting Communication: Hearing impaired  Cognition Arousal: Alert Behavior During Therapy: WFL for tasks assessed/performed   PT - Cognitive impairments: Orientation, Awareness, Memory, Attention, Initiation, Sequencing                       PT - Cognition Comments: Pt able to follow simple  commands majority of time with tactile cues. Pt with noted short term memory, decreased insight to deficits, and situation. Following commands: Impaired Following commands impaired: Follows one step commands with increased time    Cueing Cueing Techniques: Verbal cues, Tactile cues  Exercises General Exercises - Lower Extremity Long Arc Quad: AROM, Both, 10 reps, Seated    General Comments General comments (skin integrity, edema, etc.): VSS on RA      Pertinent Vitals/Pain Pain Assessment Pain Assessment: No/denies pain    Home Living                          Prior Function            PT Goals (current goals can now be found in the care plan section) Acute Rehab PT Goals Patient Stated Goal: didn't state Progress towards PT goals: Progressing toward goals    Frequency    Min 1X/week      PT Plan      Co-evaluation              AM-PAC PT 6 Clicks Mobility   Outcome Measure  Help needed turning from your back to your side while in a flat bed without using bedrails?: A Lot Help needed moving from lying on your back to sitting on the side of a flat bed without using bedrails?: A Lot Help needed moving to and from a bed to a chair (including a wheelchair)?: Total Help needed standing up from a chair using your arms (e.g., wheelchair or bedside chair)?: Total Help needed to walk in hospital room?: Total Help needed climbing 3-5 steps with a railing? : Total 6 Click Score: 8    End of Session Equipment Utilized During Treatment: Gait belt Activity Tolerance: Patient tolerated treatment well Patient left: in chair;with call bell/phone within reach;with chair alarm set Nurse Communication: Mobility status PT Visit Diagnosis: Unsteadiness on feet (R26.81);Muscle weakness (generalized) (M62.81);Difficulty in walking, not elsewhere classified (R26.2)     Time: 8854-8792 PT Time Calculation (min) (ACUTE ONLY): 22 min  Charges:    $Gait Training:  8-22 mins PT General Charges $$ ACUTE PT VISIT: 1 Visit                     Miracle Mongillo M,PT Acute Rehab Services [phone]    Amy [name] 05/22/2024, 2:16 PM

## 2024-05-22 NOTE — TOC Progression Note (Addendum)
 Transition of Care [facility]) - Progression Note    Patient Details  Name: Amy [name] MRN: [id number] Date of Birth: 1960/09/21  Transition of Care [facility]) CM/SW Contact  Lendia Dais, [place] Phone Number: 05/22/2024, 11:26 AM  Clinical Narrative:  [name] is approved. Auth ID is [id number] and valid from 05/22/2024-05/24/2024. CSW informed [facility] of [facility] and pending bed availability.   1530 - Kia stated they can take the pt tomorrow and that the pt would need to bring there CPAP machine from home. CSW informed pt's sister [name] who stated they would be able to bring the pt's CPAP machine.   CSW will plan for discharge tomorrow.    Expected Discharge Plan: Skilled Nursing Facility Barriers to Discharge: Continued Medical Work up               Expected Discharge Plan and Services In-house Referral: Clinical Social Work     Living arrangements for the past 2 months: Skilled Nursing Facility                                       Social Drivers of Health (SDOH) Interventions SDOH Screenings   Food Insecurity: No Food Insecurity (05/19/2024)  Housing: Low Risk (05/19/2024)  Transportation Needs: No Transportation Needs (05/19/2024)  Utilities: Not At Risk (05/19/2024)  Social Connections: Patient Declined (05/19/2024)  Tobacco Use: Medium Risk (05/16/2024)    Readmission Risk Interventions     No data to display

## 2024-05-22 NOTE — Progress Notes (Signed)
 " PROGRESS NOTE    Amy [name]  FMW:[id number] DOB: 08-May-1961 DOA: 05/16/2024 PCP: Sigrid Deidra Fox, MD   Brief Narrative:  Amy [name] is a 64 y.o. female with medical history significant of hypertension, hyperlipidemia, hypothyroidism who presented to emergency department with confusion and low blood pressure and poor p.o. intake.  Patient's noted to be more altered and confused than usual.  EMS was called and she was found to be hypotensive with blood pressures in the 60s.    Patient is being treated for sepsis due to UTI.  Presented with lactic acidosis, altered mental status, transient hypotension in the setting of infection.   Assessment & Plan:   Principal Problem:   Urinary tract infection   Sepsis due to UTI, POA Evidenced by lactic acidosis, altered mental status, transient hypotension in setting of infection.  Was started on broad-spectrum antibiotics with ceftriaxone , vancomycin  for given prior positive MRSA screen. Sepsis is resolved. - Continue cephalosporin (transitioned from ceftriaxone  to cefadroxil ) - stop date 1/8 - DCed vancomycin  as source of sepsis most likely UTI.   Hypotension in patient with history of hypertension Patient has had ongoing hypotension. - Hold home ARB   CKD stage III Baseline creatinine approximately 1.5. Patient is at her baseline. - Avoid nephrotoxins.   Acute infectious encephalopathy Resolved. - Continue antibiotics.   T2DM On Jardiance, liraglutide, Tresiba , insulin  sliding scale at home. - Holding home Jardiance given UTI. - Sliding scale insulin . - Will resume home liraglutide after discharge.   Blindness - Continue supportive care.   Deep tissue pressure injury gluteal region Present on admission. - Wound care consulted. - Continue wound care per wound team recs.   Deconditioning Seen by PT/OT - SNF recommended.  Placement pending.   Chronic conditions   Gout: Continue allopurinol  CAD: Continue  aspirin  Hypothyroidism: Continue home Synthroid  Hyperlipidemia: Continue simvastatin    DVT prophylaxis: enoxaparin  (LOVENOX ) injection 40 mg Start: 05/19/24 1000 SCDs Start: 05/17/24 0433   Code Status:   Code Status: Full Code  Family Communication: None present  Status is: Inpatient  Dispo: The patient is from: Home              Anticipated d/c is to: SNF              Anticipated d/c date is: Imminent              Patient currently is medically stable for discharge  Consultants:  None  Procedures:  None  Antimicrobials:  Cefadroxil  stop day 1/8  Subjective: No acute issues or events overnight denies nausea vomiting diarrhea constipation any fevers chills chest pain  Objective: Vitals:   05/21/24 1645 05/21/24 2040 05/22/24 0421 05/22/24 0843  BP: 93/60 115/60 (!) 106/52 109/60  Pulse: 71 74 71 72  Resp: 19 19 18 19   Temp: 98.1 F (36.7 C) 98.3 F (36.8 C) 98.8 F (37.1 C) 98.3 F (36.8 C)  TempSrc:  Oral Oral   SpO2: 100% 100% 98% 99%  Weight:        Intake/Output Summary (Last 24 hours) at 05/22/2024 1259 Last data filed at 05/22/2024 0423 Gross per 24 hour  Intake 1100 ml  Output 850 ml  Net 250 ml   Filed Weights   05/17/24 0500  Weight: 67.6 kg    Examination:  General:  Pleasantly resting in bed, No acute distress. HEENT:  Normocephalic atraumatic.  Sclerae nonicteric, noninjected.  Extraocular movements intact bilaterally. Neck:  Without mass or deformity.  Trachea is  midline. Lungs:  Clear to auscultate bilaterally without rhonchi, wheeze, or rales. Heart:  Regular rate and rhythm.  Without murmurs, rubs, or gallops. Abdomen:  Soft, nontender, nondistended.  Without guarding or rebound. Extremities: Without cyanosis, clubbing Skin:  Warm and dry, no erythema.   Data Reviewed: I have personally reviewed following labs and imaging studies  CBC: Recent Labs  Lab 05/16/24 1841 05/17/24 0729 05/18/24 0641 05/19/24 0525  WBC 11.1* 10.2  8.0 6.4  NEUTROABS 9.2*  --   --   --   HGB 13.5 12.4 13.7 12.7  HCT 42.8 38.3 41.1 38.8  MCV 88.4 84.4 83.0 83.1  PLT 145* 141* 145* 126*   Basic Metabolic Panel: Recent Labs  Lab 05/17/24 0729 05/18/24 0641 05/19/24 0525 05/20/24 0426 05/21/24 0320  NA 141 145 147* 142 141  K 4.2 4.3 3.9 3.7 3.8  CL 106 107 111 108 107  CO2 19* 19* 22 25 22   GLUCOSE 151* 190* 102* 166* 204*  BUN 37* 26* 18 18 18   CREATININE 1.55* 1.45* 1.27* 1.15* 1.27*  CALCIUM  9.0 9.6 9.2 9.0 9.1   GFR: Estimated Creatinine Clearance: 37 mL/min (A) (by C-G formula based on SCr of 1.27 mg/dL (H)). Liver Function Tests: Recent Labs  Lab 05/16/24 1841  AST 18  ALT <5  ALKPHOS 62  BILITOT 0.5  PROT 5.3*  ALBUMIN  2.7*   No results for input(s): LIPASE, AMYLASE in the last 168 hours. No results for input(s): AMMONIA in the last 168 hours. Coagulation Profile: Recent Labs  Lab 05/17/24 0729  INR 1.1   Cardiac Enzymes: No results for input(s): CKTOTAL, CKMB, CKMBINDEX, TROPONINI in the last 168 hours. BNP (last 3 results) No results for input(s): PROBNP in the last 8760 hours. HbA1C: No results for input(s): HGBA1C in the last 72 hours. CBG: Recent Labs  Lab 05/21/24 1137 05/21/24 1644 05/21/24 2142 05/22/24 0841 05/22/24 1136  GLUCAP 156* 217* 212* 203* 330*   Lipid Profile: No results for input(s): CHOL, HDL, LDLCALC, TRIG, CHOLHDL, LDLDIRECT in the last 72 hours. Thyroid  Function Tests: No results for input(s): TSH, T4TOTAL, FREET4, T3FREE, THYROIDAB in the last 72 hours. Anemia Panel: No results for input(s): VITAMINB12, FOLATE, FERRITIN, TIBC, IRON, RETICCTPCT in the last 72 hours. Sepsis Labs: Recent Labs  Lab 05/16/24 2055 05/17/24 0009  LATICACIDVEN 4.1* 2.1*    Recent Results (from the past 240 hours)  Blood Culture (routine x 2)     Status: None   Collection Time: 05/16/24  6:46 PM   Specimen: BLOOD  Result Value  Ref Range Status   Specimen Description BLOOD SITE NOT SPECIFIED  Final   Special Requests   Final    BOTTLES DRAWN AEROBIC AND ANAEROBIC Blood Culture results may not be optimal due to an inadequate volume of blood received in culture bottles   Culture   Final    NO GROWTH 5 DAYS Performed at [facility], [address], [place], [place] [address]    Report Status 05/22/2024 FINAL  Final  Resp panel by RT-PCR (RSV, Flu A&B, Covid) Anterior Nasal Swab     Status: None   Collection Time: 05/16/24  7:00 PM   Specimen: Anterior Nasal Swab  Result Value Ref Range Status   SARS Coronavirus 2 by RT PCR NEGATIVE NEGATIVE Final   Influenza A by PCR NEGATIVE NEGATIVE Final   Influenza B by PCR NEGATIVE NEGATIVE Final    Comment: (NOTE) The Xpert Xpress SARS-CoV-2/FLU/RSV plus assay is intended  as an aid in the diagnosis of influenza from Nasopharyngeal swab specimens and should not be used as a sole basis for treatment. Nasal washings and aspirates are unacceptable for Xpert Xpress SARS-CoV-2/FLU/RSV testing.  Fact Sheet for Patients: bloggercourse.com  Fact Sheet for Healthcare Providers: seriousbroker.it  This test is not yet approved or cleared by the [place]  FDA and has been authorized for detection and/or diagnosis of SARS-CoV-2 by FDA under an Emergency Use Authorization (EUA). This EUA will remain in effect (meaning this test can be used) for the duration of the COVID-19 declaration under Section 564(b)(1) of the Act, 21 U.S.C. section 360bbb-3(b)(1), unless the authorization is terminated or revoked.     Resp Syncytial Virus by PCR NEGATIVE NEGATIVE Final    Comment: (NOTE) Fact Sheet for Patients: bloggercourse.com  Fact Sheet for Healthcare Providers: seriousbroker.it  This test is not yet approved or cleared by the [place]  FDA and has been authorized  for detection and/or diagnosis of SARS-CoV-2 by FDA under an Emergency Use Authorization (EUA). This EUA will remain in effect (meaning this test can be used) for the duration of the COVID-19 declaration under Section 564(b)(1) of the Act, 21 U.S.C. section 360bbb-3(b)(1), unless the authorization is terminated or revoked.  Performed at [facility], [address], [place], [place] [address]   Blood Culture (routine x 2)     Status: Abnormal   Collection Time: 05/16/24  7:54 PM   Specimen: BLOOD  Result Value Ref Range Status   Specimen Description BLOOD SITE NOT SPECIFIED  Final   Special Requests   Final    BOTTLES DRAWN AEROBIC AND ANAEROBIC Blood Culture results may not be optimal due to an inadequate volume of blood received in culture bottles   Culture  Setup Time   Final    GRAM POSITIVE COCCI IN CLUSTERS IN BOTH AEROBIC AND ANAEROBIC BOTTLES CRITICAL RESULT CALLED TO, READ BACK BY AND VERIFIED WITH: PHARMD JEREMY F [id number] [id number] FCP    Culture (A)  Final    STAPHYLOCOCCUS EPIDERMIDIS STAPHYLOCOCCUS HOMINIS THE SIGNIFICANCE OF ISOLATING THIS ORGANISM FROM A SINGLE SET OF BLOOD CULTURES WHEN MULTIPLE SETS ARE DRAWN IS UNCERTAIN. PLEASE NOTIFY THE MICROBIOLOGY DEPARTMENT WITHIN ONE WEEK IF SPECIATION AND SENSITIVITIES ARE REQUIRED. Performed at [facility], [address], [place], [place] [address]    Report Status 05/19/2024 FINAL  Final  Blood Culture ID Panel (Reflexed)     Status: Abnormal   Collection Time: 05/16/24  7:54 PM  Result Value Ref Range Status   Enterococcus faecalis NOT DETECTED NOT DETECTED Final   Enterococcus Faecium NOT DETECTED NOT DETECTED Final   Listeria monocytogenes NOT DETECTED NOT DETECTED Final   Staphylococcus species DETECTED (A) NOT DETECTED Final    Comment: CRITICAL RESULT CALLED TO, READ BACK BY AND VERIFIED WITH: PHARMD JEREMY F 1435 [id number] FCP    Staphylococcus aureus (BCID) NOT DETECTED NOT DETECTED Final    Staphylococcus epidermidis DETECTED (A) NOT DETECTED Final    Comment: Methicillin (oxacillin) resistant coagulase negative staphylococcus. Possible blood culture contaminant (unless isolated from more than one blood culture draw or clinical case suggests pathogenicity). No antibiotic treatment is indicated for blood  culture contaminants. CRITICAL RESULT CALLED TO, READ BACK BY AND VERIFIED WITH: PHARMD JEREMY F 1435 [id number] FCP    Staphylococcus lugdunensis NOT DETECTED NOT DETECTED Final   Streptococcus species NOT DETECTED NOT DETECTED Final   Streptococcus agalactiae NOT DETECTED NOT DETECTED Final   Streptococcus pneumoniae NOT  DETECTED NOT DETECTED Final   Streptococcus pyogenes NOT DETECTED NOT DETECTED Final   A.calcoaceticus-baumannii NOT DETECTED NOT DETECTED Final   Bacteroides fragilis NOT DETECTED NOT DETECTED Final   Enterobacterales NOT DETECTED NOT DETECTED Final   Enterobacter cloacae complex NOT DETECTED NOT DETECTED Final   Escherichia coli NOT DETECTED NOT DETECTED Final   Klebsiella aerogenes NOT DETECTED NOT DETECTED Final   Klebsiella oxytoca NOT DETECTED NOT DETECTED Final   Klebsiella pneumoniae NOT DETECTED NOT DETECTED Final   Proteus species NOT DETECTED NOT DETECTED Final   Salmonella species NOT DETECTED NOT DETECTED Final   Serratia marcescens NOT DETECTED NOT DETECTED Final   Haemophilus influenzae NOT DETECTED NOT DETECTED Final   Neisseria meningitidis NOT DETECTED NOT DETECTED Final   Pseudomonas aeruginosa NOT DETECTED NOT DETECTED Final   Stenotrophomonas maltophilia NOT DETECTED NOT DETECTED Final   Candida albicans NOT DETECTED NOT DETECTED Final   Candida auris NOT DETECTED NOT DETECTED Final   Candida glabrata NOT DETECTED NOT DETECTED Final   Candida krusei NOT DETECTED NOT DETECTED Final   Candida parapsilosis NOT DETECTED NOT DETECTED Final   Candida tropicalis NOT DETECTED NOT DETECTED Final   Cryptococcus neoformans/gattii NOT DETECTED NOT  DETECTED Final   Methicillin resistance mecA/C DETECTED (A) NOT DETECTED Final    Comment: CRITICAL RESULT CALLED TO, READ BACK BY AND VERIFIED WITH: MAYA VENETIA FALCON 1435 [id number] FCP Performed at [facility], [address] [place], [place] [address]   Urine Culture     Status: Abnormal   Collection Time: 05/16/24  9:51 PM   Specimen: Urine, Random  Result Value Ref Range Status   Specimen Description URINE, RANDOM  Final   Special Requests   Final    NONE Reflexed from [id number] Performed at [facility], [address] [place], [place] [address]    Culture >=100,000 COLONIES/mL KLEBSIELLA PNEUMONIAE (A)  Final   Report Status 05/19/2024 FINAL  Final   Organism ID, Bacteria KLEBSIELLA PNEUMONIAE (A)  Final      Susceptibility   Klebsiella pneumoniae - MIC*    AMPICILLIN RESISTANT Resistant     CEFAZOLIN (URINE) Value in next row Sensitive      2 SENSITIVEThis is a modified FDA-approved test that has been validated and its performance characteristics determined by the reporting laboratory.  This laboratory is certified under the Clinical Laboratory Improvement Amendments CLIA as qualified to perform high complexity clinical laboratory testing.    CEFEPIME Value in next row Sensitive      2 SENSITIVEThis is a modified FDA-approved test that has been validated and its performance characteristics determined by the reporting laboratory.  This laboratory is certified under the Clinical Laboratory Improvement Amendments CLIA as qualified to perform high complexity clinical laboratory testing.    ERTAPENEM Value in next row Sensitive      2 SENSITIVEThis is a modified FDA-approved test that has been validated and its performance characteristics determined by the reporting laboratory.  This laboratory is certified under the Clinical Laboratory Improvement Amendments CLIA as qualified to perform high complexity clinical laboratory testing.    CEFTRIAXONE  Value in next row Sensitive       2 SENSITIVEThis is a modified FDA-approved test that has been validated and its performance characteristics determined by the reporting laboratory.  This laboratory is certified under the Clinical Laboratory Improvement Amendments CLIA as qualified to perform high complexity clinical laboratory testing.    CIPROFLOXACIN  Value in next row Sensitive  2 SENSITIVEThis is a modified FDA-approved test that has been validated and its performance characteristics determined by the reporting laboratory.  This laboratory is certified under the Clinical Laboratory Improvement Amendments CLIA as qualified to perform high complexity clinical laboratory testing.    GENTAMICIN Value in next row Sensitive      2 SENSITIVEThis is a modified FDA-approved test that has been validated and its performance characteristics determined by the reporting laboratory.  This laboratory is certified under the Clinical Laboratory Improvement Amendments CLIA as qualified to perform high complexity clinical laboratory testing.    NITROFURANTOIN  Value in next row Intermediate      2 SENSITIVEThis is a modified FDA-approved test that has been validated and its performance characteristics determined by the reporting laboratory.  This laboratory is certified under the Clinical Laboratory Improvement Amendments CLIA as qualified to perform high complexity clinical laboratory testing.    TRIMETH/SULFA Value in next row Sensitive      2 SENSITIVEThis is a modified FDA-approved test that has been validated and its performance characteristics determined by the reporting laboratory.  This laboratory is certified under the Clinical Laboratory Improvement Amendments CLIA as qualified to perform high complexity clinical laboratory testing.    AMPICILLIN/SULBACTAM Value in next row Sensitive      2 SENSITIVEThis is a modified FDA-approved test that has been validated and its performance characteristics determined by the reporting laboratory.  This  laboratory is certified under the Clinical Laboratory Improvement Amendments CLIA as qualified to perform high complexity clinical laboratory testing.    PIP/TAZO Value in next row Sensitive      8 SENSITIVEThis is a modified FDA-approved test that has been validated and its performance characteristics determined by the reporting laboratory.  This laboratory is certified under the Clinical Laboratory Improvement Amendments CLIA as qualified to perform high complexity clinical laboratory testing.    MEROPENEM Value in next row Sensitive      8 SENSITIVEThis is a modified FDA-approved test that has been validated and its performance characteristics determined by the reporting laboratory.  This laboratory is certified under the Clinical Laboratory Improvement Amendments CLIA as qualified to perform high complexity clinical laboratory testing.    * >=100,000 COLONIES/mL KLEBSIELLA PNEUMONIAE     Radiology Studies: No results found.  Scheduled Meds:  allopurinol   300 mg Oral Q1200   aspirin  EC  81 mg Oral Daily   cefadroxil   500 mg Oral BID   enoxaparin  (LOVENOX ) injection  40 mg Subcutaneous Q24H   gabapentin   400 mg Oral BID   insulin  aspart  0-5 Units Subcutaneous QHS   insulin  aspart  0-6 Units Subcutaneous TID WC   levothyroxine   125 mcg Oral QAC breakfast   simvastatin   20 mg Oral QPM   Continuous Infusions:   LOS: 6 days   Time spent:  Elsie JAYSON Montclair, DO [facility]  If 7PM-7AM, please contact night-coverage [email or web address]  05/22/2024, 12:59 PM      "

## 2024-05-23 LAB — GLUCOSE, CAPILLARY
Glucose-Capillary: 146 mg/dL — ABNORMAL HIGH (ref 70–99)
Glucose-Capillary: 266 mg/dL — ABNORMAL HIGH (ref 70–99)

## 2024-05-23 NOTE — Progress Notes (Signed)
 [name] Patient verbalized understanding.  Report given to the [facility] care staff Ola, RN and answered all her questions.   Skin clean, dry and intact without evidence of skin break down, no evidence of skin tears noted. IV catheter discontinued intact. Site without signs and symptoms of complications. Dressing and pressure applied. Pt denies pain at the site currently. No complaints noted.  Patient free of lines, drains, and wounds.   Discharge packet assembled. An After Visit Summary (AVS) was printed and given to the patient.  Patient was transported via safe transport  Amy [name], Amy [name], [name]

## 2024-05-23 NOTE — TOC Transition Note (Signed)
 Transition of Care [facility]) - Discharge Note   Patient Details  Name: Amy [name] MRN: [id number] Date of Birth: 11-01-1960  [facility] CM/SW Contact:  Lendia Dais, LCSWA Phone Number: 05/23/2024, 1:36 PM   Clinical Narrative:  Pt is discharging to [facility]. RN report to [phone]. Pt will go by safe transport and have an ETA of 10 minutes. RN informed.  CSW called and spoke to [place] who stated they were able to provide transportation and was agreeable to the pt traveling by safe transport.  No further TOC needs.    Final next level of care: Skilled Nursing Facility Barriers to Discharge: Barriers Resolved   Patient Goals and CMS Choice Patient states their goals for this hospitalization and ongoing recovery are:: pt disoriented          Discharge Placement              Patient chooses bed at: [facility] Patient to be transferred to facility by: Safe transport Name of family member notified: [name] Patient and family notified of of transfer: 05/23/24  Discharge Plan and Services Additional resources added to the After Visit Summary for   In-house Referral: Clinical Social Work                                   Social Drivers of Health (SDOH) Interventions SDOH Screenings   Food Insecurity: No Food Insecurity (05/19/2024)  Housing: Low Risk (05/19/2024)  Transportation Needs: No Transportation Needs (05/19/2024)  Utilities: Not At Risk (05/19/2024)  Social Connections: Patient Declined (05/19/2024)  Tobacco Use: Medium Risk (05/16/2024)     Readmission Risk Interventions     No data to display

## 2024-05-23 NOTE — Progress Notes (Signed)
 Occupational Therapy Treatment Patient Details Name: Amy [name] MRN: [id number] DOB: 1960-11-07 Today's Date: 05/23/2024   History of present illness 64 yo F adm 05/16/24 with AMS, hypotension, UTI, sepsis. PMhx: blindness, DM, asthma, neuropathy, nephropathy, HLD, OSA, HTN   OT comments  Patient demonstrating good gains with OT treatment. Patient was mod assist to get to EOB with verbal cues and mod assists for sit to stands, mobility, and transfers with RW. Patient able to stand at sink for grooming tasks with min assist and cues due to blindness. Patient instructed in BUE strengthening with level 2 therapy band. Patient will benefit from continued inpatient follow up therapy, <3 hours/day.  Acute OT to continue to follow to address established goals to facilitate DC to next venue of care.        If plan is discharge home, recommend the following:  A lot of help with walking and/or transfers;A lot of help with bathing/dressing/bathroom;Assistance with cooking/housework;Direct supervision/assist for medications management;Direct supervision/assist for financial management;Assist for transportation;Help with stairs or ramp for entrance;Supervision due to cognitive status   Equipment Recommendations  Other (comment) (defer to next venue of care)    Recommendations for Other Services      Precautions / Restrictions Precautions Precautions: Fall Recall of Precautions/Restrictions: Impaired Precaution/Restrictions Comments: pt blind. 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 Restrictions Weight Bearing Restrictions Per Provider Order: No       Mobility Bed Mobility Overal bed mobility: Needs Assistance Bed Mobility: Supine to Sit     Supine to sit: Mod assist     General bed mobility comments: increased time and verbal cues throughout    Transfers Overall transfer level: Needs assistance Equipment used: Rolling walker (2 wheels) Transfers: Sit to/from Stand Sit to Stand: Mod  assist           General transfer comment: mod assist to stand from EOB and recliner, mod assist for walker management during mobility     Balance Overall balance assessment: Needs assistance Sitting-balance support: No upper extremity supported, Feet supported Sitting balance-Leahy Scale: Fair Sitting balance - Comments: EOB   Standing balance support: Single extremity supported, Bilateral upper extremity supported, During functional activity Standing balance-Leahy Scale: Poor Standing balance comment: reliant on at least one extremity support when standing                           ADL either performed or assessed with clinical judgement   ADL Overall ADL's : Needs assistance/impaired     Grooming: Wash/dry hands;Wash/dry face;Oral care;Minimal assistance;Standing                   Toilet Transfer: Minimal assistance;Moderate assistance;Ambulation;Rolling walker (2 wheels) Toilet Transfer Details (indicate cue type and reason): simulated           General ADL Comments: improved standing balance and command following    Extremity/Trunk Assessment              Vision       Perception     Praxis     Communication Communication Communication: Impaired Factors Affecting Communication: Hearing impaired   Cognition Arousal: Alert Behavior During Therapy: WFL for tasks assessed/performed Cognition: Cognition impaired, No family/caregiver present to determine baseline   Orientation impairments: Time, Situation Awareness: Intellectual awareness impaired, Online awareness impaired Memory impairment (select all impairments): Short-term memory, Working memory Attention impairment (select first level of impairment): Sustained attention Executive functioning impairment (select all impairments):  Initiation, Organization, Sequencing, Reasoning, Problem solving                   Following commands: Impaired Following commands impaired:  Follows one step commands with increased time      Cueing   Cueing Techniques: Verbal cues, Tactile cues  Exercises Exercises: General Upper Extremity General Exercises - Upper Extremity Shoulder Horizontal ABduction: Strengthening, Both, 10 reps, Seated, Theraband Theraband Level (Shoulder Horizontal Abduction): Level 2 (Red) Elbow Flexion: Strengthening, Both, 10 reps, Seated, Theraband Theraband Level (Elbow Flexion): Level 2 (Red) Elbow Extension: Strengthening, Both, 10 reps, Seated, Theraband Theraband Level (Elbow Extension): Level 2 (Red)    Shoulder Instructions       General Comments VSS on RA    Pertinent Vitals/ Pain       Pain Assessment Pain Assessment: Faces Faces Pain Scale: Hurts little more Pain Location: back Pain Descriptors / Indicators: Grimacing, Discomfort Pain Intervention(s): Limited activity within patient's tolerance, Monitored during session, Repositioned, Patient requesting pain meds-RN notified  Home Living                                          Prior Functioning/Environment              Frequency  Min 2X/week        Progress Toward Goals  OT Goals(current goals can now be found in the care plan section)  Progress towards OT goals: Progressing toward goals  Acute Rehab OT Goals Patient Stated Goal: to get better OT Goal Formulation: With patient Time For Goal Achievement: 06/02/24 Potential to Achieve Goals: Good ADL Goals Pt Will Perform Eating: with set-up;sitting Pt Will Perform Grooming: with set-up;sitting Pt Will Perform Lower Body Dressing: with mod assist;sit to/from stand Pt Will Transfer to Toilet: with contact guard assist;bedside commode Pt/caregiver will Perform Home Exercise Program: Increased strength;Both right and left upper extremity;With written HEP provided;With minimal assist  Plan      Co-evaluation                 AM-PAC OT 6 Clicks Daily Activity     Outcome  Measure   Help from another person eating meals?: A Little Help from another person taking care of personal grooming?: A Lot Help from another person toileting, which includes using toliet, bedpan, or urinal?: A Lot Help from another person bathing (including washing, rinsing, drying)?: A Lot Help from another person to put on and taking off regular upper body clothing?: A Little Help from another person to put on and taking off regular lower body clothing?: Total 6 Click Score: 13    End of Session Equipment Utilized During Treatment: Gait belt;Rolling walker (2 wheels)  OT Visit Diagnosis: Unsteadiness on feet (R26.81);Muscle weakness (generalized) (M62.81);Low vision, both eyes (H54.2);Other symptoms and signs involving cognitive function   Activity Tolerance Patient tolerated treatment well   Patient Left in chair;with call bell/phone within reach;with chair alarm set;with nursing/sitter in room   Nurse Communication Mobility status        Time: 8977-8947 OT Time Calculation (min): 30 min  Charges: OT General Charges $OT Visit: 1 Visit OT Treatments $Self Care/Home Management : 8-22 mins $Therapeutic Exercise: 8-22 mins  Dick [name], OTA Acute Rehabilitation Services  Office [phone]   Amy [name] 05/23/2024, 1:11 PM

## 2024-05-23 NOTE — Discharge Summary (Addendum)
 Physician Discharge Summary  Amy [name] FMW:[id number] DOB: 1960-05-28 DOA: 05/16/2024  PCP: Sigrid Deidra Fox, MD  Admit date: 05/16/2024 Discharge date: 05/23/2024  Admitted From: Home Disposition: Facility  Recommendations for Outpatient Follow-up:  Follow up with PCP in 1-2 weeks  Discharge Condition: Stable CODE STATUS: Full Diet recommendation:    Brief/Interim Summary: Amy [name] is a 64 y.o. female with medical history significant of hypertension, hyperlipidemia, hypothyroidism who presented to emergency department with confusion and low blood pressure and poor p.o. intake.  Patient's noted to be more altered and confused than usual.  EMS was called and she was found to be hypotensive with blood pressures in the 60s. Patient is being treated for sepsis due to UTI.  Presented with lactic acidosis, altered mental status, transient hypotension in the setting of infection.  Patient admitted as above with altered mental status confusion in setting of sepsis and UTI.  Appears to be improving drastically over the past 48 hours.  At this time she continues to be profoundly weak from baseline, requiring ongoing physical therapy evaluation and treatment, as such she has been evaluated and deemed appropriate for discharge to skilled nursing facility, insurance approval noted today as she is otherwise stable and agreeable for discharge to facility.  Discussed with family in regards to disposition and plan of care.   Discharge Diagnoses:  Principal Problem:   Urinary tract infection  Severe sepsis due to UTI, POA Evidenced by lactic acidosis, altered mental status, transient hypotension in setting of infection, tachycardia and tachypnea.  Was started on broad-spectrum antibiotics with ceftriaxone , vancomycin  for given prior positive MRSA screen -now completed antibiotics Sepsis is resolved.   Hypotension in patient with history of hypertension -Blood pressure continues to be on the  low side, follow-up outpatient with PCP for reevaluation of blood pressure and possible resumption of home ARB   CKD stage III At baseline Acute infectious encephalopathy Resolved. Uncontrolled insulin -dependent diabetes type 2 with hyperglycemia  - Discussed improved diet, otherwise resume home jardiance, liraglutide, Tresiba , insulin  sliding scale at home.   Blindness - Continue supportive care.   Deep tissue pressure injury gluteal region Present on admission. - Wound care consulted. - Continue wound care per wound team recs.   Deconditioning Seen by PT/OT - SNF recommended.  Placement pending.   Chronic conditions otherwise stable:  OSA: Continue to utilize home CPAP at current settings (auto-titrate per machine protocol) - no manual settings are recommended at this time. Gout: Continue allopurinol  CAD: Continue aspirin  Hypothyroidism: Continue home Synthroid  Hyperlipidemia: Continue simvastatin   Discharge Instructions  Discharge Instructions     Call MD for:  difficulty breathing, headache or visual disturbances   Complete by: As directed    Call MD for:  extreme fatigue   Complete by: As directed    Call MD for:  hives   Complete by: As directed    Call MD for:  persistant dizziness or light-headedness   Complete by: As directed    Call MD for:  persistant nausea and vomiting   Complete by: As directed    Call MD for:  redness, tenderness, or signs of infection (pain, swelling, redness, odor or green/yellow discharge around incision site)   Complete by: As directed    Call MD for:  severe uncontrolled pain   Complete by: As directed    Call MD for:  temperature >100.4   Complete by: As directed    Discharge wound care:   Complete by: As directed  Cleanse sacrum and buttock with saline, pat dry. Cover with single laye of xeroform and top with foam. Change daily   Increase activity slowly   Complete by: As directed       Allergies as of 05/23/2024        Reactions   Penicillins Hives, Swelling   Tramadol  Nausea And Vomiting   Intense nausea   Vicodin [hydrocodone -acetaminophen ] Nausea And Vomiting   Codeine Nausea Only   Other reaction(s): Unknown   Iodine-131 Hives   Iohexol Hives   Pt developed itching and hives along with nasal congestion; needs 13 hour premeds for future studies, Onset Date: [id number]   Lisinopril Cough   Other reaction(s): Unknown   Sulfa Antibiotics Nausea And Vomiting        Medication List     STOP taking these medications    fluconazole  150 MG tablet Commonly known as: DIFLUCAN    hydrOXYzine  25 MG tablet Commonly known as: ATARAX    nitrofurantoin  (macrocrystal-monohydrate) 100 MG capsule Commonly known as: MACROBID    olmesartan 40 MG tablet Commonly known as: BENICAR       TAKE these medications    albuterol  108 (90 Base) MCG/ACT inhaler Commonly known as: VENTOLIN  HFA Inhale 2 puffs into the lungs every 4 (four) hours as needed for wheezing or shortness of breath. For short   allopurinol  300 MG tablet Commonly known as: ZYLOPRIM  Take 300 mg by mouth 2 (two) times daily.   aspirin  EC 81 MG tablet Take 81 mg by mouth daily.   blood glucose meter kit and supplies Dispense based on patient and insurance preference. Use up to four times daily as directed. (FOR ICD-10 E10.9, E11.9).   celecoxib  200 MG capsule Commonly known as: CELEBREX  Take 200 mg by mouth 2 (two) times daily.   cetirizine 10 MG tablet Commonly known as: ZYRTEC Take 10 mg by mouth at bedtime.   dapagliflozin  propanediol 10 MG Tabs tablet Commonly known as: Farxiga  Take 1 tablet (10 mg total) by mouth daily.   Dexcom G7 Sensor Misc 1 Device by Does not apply route as directed.   docusate sodium  100 MG capsule Commonly known as: COLACE Take 100 mg by mouth 2 (two) times daily.   Easy Touch Alcohol  Prep Medium 70 % Pads Use prior to any injections to clean your skin   Embrace Talk Glucose Test test  strip Generic drug: glucose blood USE TO CHECK BLOOD SUGARS FOUR TIMES A DAY BEFORE MEALS AND AT BEDTIME DX E11.29   OneTouch Verio test strip Generic drug: glucose blood Check blood sugar 1-2 times daily   esomeprazole  40 MG capsule Commonly known as: NEXIUM  Take 1 capsule (40 mg total) by mouth daily.   fluticasone  50 MCG/ACT nasal spray Commonly known as: FLONASE  Place 2 sprays into both nostrils daily.   gabapentin  400 MG capsule Commonly known as: NEURONTIN  Take 400 mg by mouth 2 (two) times daily.   insulin  lispro 100 UNIT/ML KwikPen Commonly known as: HUMALOG  MAX DAILY 30 UNITS What changed:  how much to take how to take this when to take this additional instructions   Insulin  Pen Needle 32G X 4 MM Misc 1 Device by Does not apply route in the morning, at noon, in the evening, and at bedtime.   levothyroxine  125 MCG tablet Commonly known as: SYNTHROID  Take 125 mcg by mouth daily before breakfast.   MiraLax  17 GM/SCOOP powder Generic drug: polyethylene glycol powder Take 17 g by mouth daily as needed for moderate constipation.  Prodigy Autocode Blood Glucose w/Device Kit 1 Device by Does not apply route daily in the afternoon.   Prodigy Twist Top Lancets 28G Misc USE TO CHECK BLOOD SUGARS   simvastatin  20 MG tablet Commonly known as: ZOCOR  Take 1 tablet (20 mg total) by mouth every evening.   Tresiba  FlexTouch 200 UNIT/ML FlexTouch Pen Generic drug: insulin  degludec Inject 8 Units into the skin daily at 6 (six) AM. What changed: when to take this   Trulicity  3 MG/0.5ML Soaj Generic drug: Dulaglutide  Inject 3 mg as directed once a week. What changed: additional instructions               Discharge Care Instructions  (From admission, onward)           Start     Ordered   05/23/24 0000  Discharge wound care:       Comments: Cleanse sacrum and buttock with saline, pat dry. Cover with single laye of xeroform and top with foam. Change daily    05/23/24 0835            Contact information for after-discharge care     Destination     [facility] .   Service: Skilled Nursing Contact information: [address] [place]  [address] [phone]                    Allergies[1]  Consultations: None   Procedures/Studies: DG Chest Port 1 View Result Date: 05/16/2024 EXAM: 1 VIEW(S) XRAY OF THE CHEST 05/16/2024 07:38:00 PM COMPARISON: 03/04/2024 CLINICAL HISTORY: Questionable sepsis - evaluate for abnormality FINDINGS: LUNGS AND PLEURA: No focal pulmonary opacity. No pleural effusion. No pneumothorax. HEART AND MEDIASTINUM: No acute abnormality of the cardiac and mediastinal silhouettes. BONES AND SOFT TISSUES: Right upper quadrant surgical clips noted. Multilevel degenerative change of thoracic spine. No acute osseous abnormality. IMPRESSION: 1. No acute process. Electronically signed by: Greig Pique MD 05/16/2024 07:45 PM EST RP Workstation: [id number]   CT HEAD WO CONTRAST ( ) Result Date: 05/16/2024 EXAM: CT HEAD WITHOUT CONTRAST 05/16/2024 07:21:56 PM TECHNIQUE: CT of the head was performed without the administration of intravenous contrast. Automated exposure control, iterative reconstruction, and/or weight based adjustment of the mA/kV was utilized to reduce the radiation dose to as low as reasonably achievable. COMPARISON: 02/20/2023 CLINICAL HISTORY: Mental status change, unknown cause FINDINGS: BRAIN AND VENTRICLES: No acute hemorrhage. No evidence of acute infarct. No hydrocephalus. No extra-axial collection. No mass effect or midline shift. Stable moderate periventricular white matter hypodensity, nonspecific but most commonly secondary to chronic small vessel ischemia. Unchanged 4 mm left frontal convexity calcified meningioma. Atherosclerosis of skullbase vasculature without hyperdense vessel or abnormal calcification. ORBITS: Postsurgical changes of the bilateral globes, stable.  SINUSES: No acute abnormality. SOFT TISSUES AND SKULL: No acute soft tissue abnormality. No skull fracture. IMPRESSION: 1. No acute intracranial abnormality. Electronically signed by: Kate Plummer MD 05/16/2024 07:31 PM EST RP Workstation: [id number]     Subjective: No acute issues or events overnight denies nausea vomit diarrhea constipation headache fevers chills or chest pain   Discharge Exam: Vitals:   05/23/24 0553 05/23/24 0835  BP: 131/73 109/71  Pulse: 60 70  Resp: 17 18  Temp: 98 F (36.7 C) 98.3 F (36.8 C)  SpO2: 100% 100%   Vitals:   05/22/24 1739 05/22/24 1937 05/23/24 0553 05/23/24 0835  BP: 124/60 127/76 131/73 109/71  Pulse: 63 67 60 70  Resp: 19 17 17 18   Temp: 98.3 F (36.8 C) 98.1  F (36.7 C) 98 F (36.7 C) 98.3 F (36.8 C)  TempSrc:  Oral    SpO2: 98% 100% 100% 100%  Weight:        General: Pt is alert, awake, not in acute distress Cardiovascular: RRR, S1/S2 +, no rubs, no gallops Respiratory: CTA bilaterally, no wheezing, no rhonchi Abdominal: Soft, NT, ND, bowel sounds + Extremities: no edema, no cyanosis or clubbing    The results of significant diagnostics from this hospitalization (including imaging, microbiology, ancillary and laboratory) are listed below for reference.     Microbiology: Recent Results (from the past 240 hours)  Blood Culture (routine x 2)     Status: None   Collection Time: 05/16/24  6:46 PM   Specimen: BLOOD  Result Value Ref Range Status   Specimen Description BLOOD SITE NOT SPECIFIED  Final   Special Requests   Final    BOTTLES DRAWN AEROBIC AND ANAEROBIC Blood Culture results may not be optimal due to an inadequate volume of blood received in culture bottles   Culture   Final    NO GROWTH 5 DAYS Performed at [facility], [address], [place], [place] [address]    Report Status 05/22/2024 FINAL  Final  Resp panel by RT-PCR (RSV, Flu A&B, Covid) Anterior Nasal Swab     Status: None   Collection Time:  05/16/24  7:00 PM   Specimen: Anterior Nasal Swab  Result Value Ref Range Status   SARS Coronavirus 2 by RT PCR NEGATIVE NEGATIVE Final   Influenza A by PCR NEGATIVE NEGATIVE Final   Influenza B by PCR NEGATIVE NEGATIVE Final    Comment: (NOTE) The Xpert Xpress SARS-CoV-2/FLU/RSV plus assay is intended as an aid in the diagnosis of influenza from Nasopharyngeal swab specimens and should not be used as a sole basis for treatment. Nasal washings and aspirates are unacceptable for Xpert Xpress SARS-CoV-2/FLU/RSV testing.  Fact Sheet for Patients: bloggercourse.com  Fact Sheet for Healthcare Providers: seriousbroker.it  This test is not yet approved or cleared by the [place]  FDA and has been authorized for detection and/or diagnosis of SARS-CoV-2 by FDA under an Emergency Use Authorization (EUA). This EUA will remain in effect (meaning this test can be used) for the duration of the COVID-19 declaration under Section 564(b)(1) of the Act, 21 U.S.C. section 360bbb-3(b)(1), unless the authorization is terminated or revoked.     Resp Syncytial Virus by PCR NEGATIVE NEGATIVE Final    Comment: (NOTE) Fact Sheet for Patients: bloggercourse.com  Fact Sheet for Healthcare Providers: seriousbroker.it  This test is not yet approved or cleared by the [place]  FDA and has been authorized for detection and/or diagnosis of SARS-CoV-2 by FDA under an Emergency Use Authorization (EUA). This EUA will remain in effect (meaning this test can be used) for the duration of the COVID-19 declaration under Section 564(b)(1) of the Act, 21 U.S.C. section 360bbb-3(b)(1), unless the authorization is terminated or revoked.  Performed at [facility], [address] [place], [place] [address]   Blood Culture (routine x 2)     Status: Abnormal   Collection Time: 05/16/24  7:54 PM    Specimen: BLOOD  Result Value Ref Range Status   Specimen Description BLOOD SITE NOT SPECIFIED  Final   Special Requests   Final    BOTTLES DRAWN AEROBIC AND ANAEROBIC Blood Culture results may not be optimal due to an inadequate volume of blood received in culture bottles   Culture  Setup Time  Final    GRAM POSITIVE COCCI IN CLUSTERS IN BOTH AEROBIC AND ANAEROBIC BOTTLES CRITICAL RESULT CALLED TO, READ BACK BY AND VERIFIED WITH: PHARMD JEREMY F [id number] [id number] FCP    Culture (A)  Final    STAPHYLOCOCCUS EPIDERMIDIS STAPHYLOCOCCUS HOMINIS THE SIGNIFICANCE OF ISOLATING THIS ORGANISM FROM A SINGLE SET OF BLOOD CULTURES WHEN MULTIPLE SETS ARE DRAWN IS UNCERTAIN. PLEASE NOTIFY THE MICROBIOLOGY DEPARTMENT WITHIN ONE WEEK IF SPECIATION AND SENSITIVITIES ARE REQUIRED. Performed at [facility], [address] [place], [place] [address]    Report Status 05/19/2024 FINAL  Final  Blood Culture ID Panel (Reflexed)     Status: Abnormal   Collection Time: 05/16/24  7:54 PM  Result Value Ref Range Status   Enterococcus faecalis NOT DETECTED NOT DETECTED Final   Enterococcus Faecium NOT DETECTED NOT DETECTED Final   Listeria monocytogenes NOT DETECTED NOT DETECTED Final   Staphylococcus species DETECTED (A) NOT DETECTED Final    Comment: CRITICAL RESULT CALLED TO, READ BACK BY AND VERIFIED WITH: PHARMD JEREMY F [id number] [id number] FCP    Staphylococcus aureus (BCID) NOT DETECTED NOT DETECTED Final   Staphylococcus epidermidis DETECTED (A) NOT DETECTED Final    Comment: Methicillin (oxacillin) resistant coagulase negative staphylococcus. Possible blood culture contaminant (unless isolated from more than one blood culture draw or clinical case suggests pathogenicity). No antibiotic treatment is indicated for blood  culture contaminants. CRITICAL RESULT CALLED TO, READ BACK BY AND VERIFIED WITH: PHARMD JEREMY F [id number] [id number] FCP    Staphylococcus lugdunensis NOT DETECTED NOT DETECTED Final    Streptococcus species NOT DETECTED NOT DETECTED Final   Streptococcus agalactiae NOT DETECTED NOT DETECTED Final   Streptococcus pneumoniae NOT DETECTED NOT DETECTED Final   Streptococcus pyogenes NOT DETECTED NOT DETECTED Final   A.calcoaceticus-baumannii NOT DETECTED NOT DETECTED Final   Bacteroides fragilis NOT DETECTED NOT DETECTED Final   Enterobacterales NOT DETECTED NOT DETECTED Final   Enterobacter cloacae complex NOT DETECTED NOT DETECTED Final   Escherichia coli NOT DETECTED NOT DETECTED Final   Klebsiella aerogenes NOT DETECTED NOT DETECTED Final   Klebsiella oxytoca NOT DETECTED NOT DETECTED Final   Klebsiella pneumoniae NOT DETECTED NOT DETECTED Final   Proteus species NOT DETECTED NOT DETECTED Final   Salmonella species NOT DETECTED NOT DETECTED Final   Serratia marcescens NOT DETECTED NOT DETECTED Final   Haemophilus influenzae NOT DETECTED NOT DETECTED Final   Neisseria meningitidis NOT DETECTED NOT DETECTED Final   Pseudomonas aeruginosa NOT DETECTED NOT DETECTED Final   Stenotrophomonas maltophilia NOT DETECTED NOT DETECTED Final   Candida albicans NOT DETECTED NOT DETECTED Final   Candida auris NOT DETECTED NOT DETECTED Final   Candida glabrata NOT DETECTED NOT DETECTED Final   Candida krusei NOT DETECTED NOT DETECTED Final   Candida parapsilosis NOT DETECTED NOT DETECTED Final   Candida tropicalis NOT DETECTED NOT DETECTED Final   Cryptococcus neoformans/gattii NOT DETECTED NOT DETECTED Final   Methicillin resistance mecA/C DETECTED (A) NOT DETECTED Final    Comment: CRITICAL RESULT CALLED TO, READ BACK BY AND VERIFIED WITH: MAYA VENETIA FALCON 1435 [id number] FCP Performed at [facility], [address] [place], [place] [address]   Urine Culture     Status: Abnormal   Collection Time: 05/16/24  9:51 PM   Specimen: Urine, Random  Result Value Ref Range Status   Specimen Description URINE, RANDOM  Final   Special Requests   Final    NONE Reflexed from  [id number] Performed at [facility], [address], [place], [place] [address]    Culture >=100,000 COLONIES/mL KLEBSIELLA PNEUMONIAE (A)  Final   Report Status 05/19/2024 FINAL  Final   Organism ID, Bacteria KLEBSIELLA PNEUMONIAE (A)  Final      Susceptibility   Klebsiella pneumoniae - MIC*    AMPICILLIN RESISTANT Resistant     CEFAZOLIN (URINE) Value in next row Sensitive      2 SENSITIVEThis is a modified FDA-approved test that has been validated and its performance characteristics determined by the reporting laboratory.  This laboratory is certified under the Clinical Laboratory Improvement Amendments CLIA as qualified to perform high complexity clinical laboratory testing.    CEFEPIME Value in next row Sensitive      2 SENSITIVEThis is a modified FDA-approved test that has been validated and its performance characteristics determined by the reporting laboratory.  This laboratory is certified under the Clinical Laboratory Improvement Amendments CLIA as qualified to perform high complexity clinical laboratory testing.    ERTAPENEM Value in next row Sensitive      2 SENSITIVEThis is a modified FDA-approved test that has been validated and its performance characteristics determined by the reporting laboratory.  This laboratory is certified under the Clinical Laboratory Improvement Amendments CLIA as qualified to perform high complexity clinical laboratory testing.    CEFTRIAXONE  Value in next row Sensitive      2 SENSITIVEThis is a modified FDA-approved test that has been validated and its performance characteristics determined by the reporting laboratory.  This laboratory is certified under the Clinical Laboratory Improvement Amendments CLIA as qualified to perform high complexity clinical laboratory testing.    CIPROFLOXACIN  Value in next row Sensitive      2 SENSITIVEThis is a modified FDA-approved test that has been validated and its performance characteristics determined by the reporting  laboratory.  This laboratory is certified under the Clinical Laboratory Improvement Amendments CLIA as qualified to perform high complexity clinical laboratory testing.    GENTAMICIN Value in next row Sensitive      2 SENSITIVEThis is a modified FDA-approved test that has been validated and its performance characteristics determined by the reporting laboratory.  This laboratory is certified under the Clinical Laboratory Improvement Amendments CLIA as qualified to perform high complexity clinical laboratory testing.    NITROFURANTOIN  Value in next row Intermediate      2 SENSITIVEThis is a modified FDA-approved test that has been validated and its performance characteristics determined by the reporting laboratory.  This laboratory is certified under the Clinical Laboratory Improvement Amendments CLIA as qualified to perform high complexity clinical laboratory testing.    TRIMETH/SULFA Value in next row Sensitive      2 SENSITIVEThis is a modified FDA-approved test that has been validated and its performance characteristics determined by the reporting laboratory.  This laboratory is certified under the Clinical Laboratory Improvement Amendments CLIA as qualified to perform high complexity clinical laboratory testing.    AMPICILLIN/SULBACTAM Value in next row Sensitive      2 SENSITIVEThis is a modified FDA-approved test that has been validated and its performance characteristics determined by the reporting laboratory.  This laboratory is certified under the Clinical Laboratory Improvement Amendments CLIA as qualified to perform high complexity clinical laboratory testing.    PIP/TAZO Value in next row Sensitive      8 SENSITIVEThis is a modified FDA-approved test that has been validated and its performance characteristics determined by the reporting laboratory.  This laboratory is certified under the Clinical Laboratory Improvement Amendments CLIA  as qualified to perform high complexity clinical laboratory  testing.    MEROPENEM Value in next row Sensitive      8 SENSITIVEThis is a modified FDA-approved test that has been validated and its performance characteristics determined by the reporting laboratory.  This laboratory is certified under the Clinical Laboratory Improvement Amendments CLIA as qualified to perform high complexity clinical laboratory testing.    * >=100,000 COLONIES/mL KLEBSIELLA PNEUMONIAE     Labs: BNP (last 3 results) Recent Labs    11/10/23 0106  BNP 26.2   Basic Metabolic Panel: Recent Labs  Lab 05/17/24 0729 05/18/24 0641 05/19/24 0525 05/20/24 0426 05/21/24 0320  NA 141 145 147* 142 141  K 4.2 4.3 3.9 3.7 3.8  CL 106 107 111 108 107  CO2 19* 19* 22 25 22   GLUCOSE 151* 190* 102* 166* 204*  BUN 37* 26* 18 18 18   CREATININE 1.55* 1.45* 1.27* 1.15* 1.27*  CALCIUM  9.0 9.6 9.2 9.0 9.1   Liver Function Tests: Recent Labs  Lab 05/16/24 1841  AST 18  ALT <5  ALKPHOS 62  BILITOT 0.5  PROT 5.3*  ALBUMIN  2.7*   No results for input(s): LIPASE, AMYLASE in the last 168 hours. No results for input(s): AMMONIA in the last 168 hours. CBC: Recent Labs  Lab 05/16/24 1841 05/17/24 0729 05/18/24 0641 05/19/24 0525  WBC 11.1* 10.2 8.0 6.4  NEUTROABS 9.2*  --   --   --   HGB 13.5 12.4 13.7 12.7  HCT 42.8 38.3 41.1 38.8  MCV 88.4 84.4 83.0 83.1  PLT 145* 141* 145* 126*   Cardiac Enzymes: No results for input(s): CKTOTAL, CKMB, CKMBINDEX, TROPONINI in the last 168 hours. BNP: Invalid input(s): POCBNP CBG: Recent Labs  Lab 05/22/24 1136 05/22/24 1741 05/22/24 1937 05/23/24 0809 05/23/24 1126  GLUCAP 330* 263* 222* 146* 266*   D-Dimer No results for input(s): DDIMER in the last 72 hours. Hgb A1c No results for input(s): HGBA1C in the last 72 hours. Lipid Profile No results for input(s): CHOL, HDL, LDLCALC, TRIG, CHOLHDL, LDLDIRECT in the last 72 hours. Thyroid  function studies No results for input(s): TSH,  T4TOTAL, T3FREE, THYROIDAB in the last 72 hours.  Invalid input(s): FREET3 Anemia work up No results for input(s): VITAMINB12, FOLATE, FERRITIN, TIBC, IRON, RETICCTPCT in the last 72 hours. Urinalysis    Component Value Date/Time   COLORURINE YELLOW 05/16/2024 2151   APPEARANCEUR TURBID (A) 05/16/2024 2151   LABSPEC 1.012 05/16/2024 2151   PHURINE 5.0 05/16/2024 2151   GLUCOSEU >=500 (A) 05/16/2024 2151   HGBUR MODERATE (A) 05/16/2024 2151   BILIRUBINUR NEGATIVE 05/16/2024 2151   KETONESUR 5 (A) 05/16/2024 2151   PROTEINUR 30 (A) 05/16/2024 2151   UROBILINOGEN 0.2 11/06/2021 1202   NITRITE NEGATIVE 05/16/2024 2151   LEUKOCYTESUR LARGE (A) 05/16/2024 2151   Sepsis Labs Recent Labs  Lab 05/16/24 1841 05/17/24 0729 05/18/24 0641 05/19/24 0525  WBC 11.1* 10.2 8.0 6.4   Microbiology Recent Results (from the past 240 hours)  Blood Culture (routine x 2)     Status: None   Collection Time: 05/16/24  6:46 PM   Specimen: BLOOD  Result Value Ref Range Status   Specimen Description BLOOD SITE NOT SPECIFIED  Final   Special Requests   Final    BOTTLES DRAWN AEROBIC AND ANAEROBIC Blood Culture results may not be optimal due to an inadequate volume of blood received in culture bottles   Culture   Final    NO GROWTH 5  DAYS Performed at [facility], [address], [place], [place] [address]    Report Status 05/22/2024 FINAL  Final  Resp panel by RT-PCR (RSV, Flu A&B, Covid) Anterior Nasal Swab     Status: None   Collection Time: 05/16/24  7:00 PM   Specimen: Anterior Nasal Swab  Result Value Ref Range Status   SARS Coronavirus 2 by RT PCR NEGATIVE NEGATIVE Final   Influenza A by PCR NEGATIVE NEGATIVE Final   Influenza B by PCR NEGATIVE NEGATIVE Final    Comment: (NOTE) The Xpert Xpress SARS-CoV-2/FLU/RSV plus assay is intended as an aid in the diagnosis of influenza from Nasopharyngeal swab specimens and should not be used as a sole basis for  treatment. Nasal washings and aspirates are unacceptable for Xpert Xpress SARS-CoV-2/FLU/RSV testing.  Fact Sheet for Patients: bloggercourse.com  Fact Sheet for Healthcare Providers: seriousbroker.it  This test is not yet approved or cleared by the [place]  FDA and has been authorized for detection and/or diagnosis of SARS-CoV-2 by FDA under an Emergency Use Authorization (EUA). This EUA will remain in effect (meaning this test can be used) for the duration of the COVID-19 declaration under Section 564(b)(1) of the Act, 21 U.S.C. section 360bbb-3(b)(1), unless the authorization is terminated or revoked.     Resp Syncytial Virus by PCR NEGATIVE NEGATIVE Final    Comment: (NOTE) Fact Sheet for Patients: bloggercourse.com  Fact Sheet for Healthcare Providers: seriousbroker.it  This test is not yet approved or cleared by the [place]  FDA and has been authorized for detection and/or diagnosis of SARS-CoV-2 by FDA under an Emergency Use Authorization (EUA). This EUA will remain in effect (meaning this test can be used) for the duration of the COVID-19 declaration under Section 564(b)(1) of the Act, 21 U.S.C. section 360bbb-3(b)(1), unless the authorization is terminated or revoked.  Performed at [facility], [address] [place], [place] [address]   Blood Culture (routine x 2)     Status: Abnormal   Collection Time: 05/16/24  7:54 PM   Specimen: BLOOD  Result Value Ref Range Status   Specimen Description BLOOD SITE NOT SPECIFIED  Final   Special Requests   Final    BOTTLES DRAWN AEROBIC AND ANAEROBIC Blood Culture results may not be optimal due to an inadequate volume of blood received in culture bottles   Culture  Setup Time   Final    GRAM POSITIVE COCCI IN CLUSTERS IN BOTH AEROBIC AND ANAEROBIC BOTTLES CRITICAL RESULT CALLED TO, READ BACK BY AND VERIFIED  WITH: PHARMD JEREMY F [id number] FCP    Culture (A)  Final    STAPHYLOCOCCUS EPIDERMIDIS STAPHYLOCOCCUS HOMINIS THE SIGNIFICANCE OF ISOLATING THIS ORGANISM FROM A SINGLE SET OF BLOOD CULTURES WHEN MULTIPLE SETS ARE DRAWN IS UNCERTAIN. PLEASE NOTIFY THE MICROBIOLOGY DEPARTMENT WITHIN ONE WEEK IF SPECIATION AND SENSITIVITIES ARE REQUIRED. Performed at [facility], [address], [place], [place] [address]    Report Status 05/19/2024 FINAL  Final  Blood Culture ID Panel (Reflexed)     Status: Abnormal   Collection Time: 05/16/24  7:54 PM  Result Value Ref Range Status   Enterococcus faecalis NOT DETECTED NOT DETECTED Final   Enterococcus Faecium NOT DETECTED NOT DETECTED Final   Listeria monocytogenes NOT DETECTED NOT DETECTED Final   Staphylococcus species DETECTED (A) NOT DETECTED Final    Comment: CRITICAL RESULT CALLED TO, READ BACK BY AND VERIFIED WITH: PHARMD JEREMY F 1435 [id number] FCP    Staphylococcus  aureus (BCID) NOT DETECTED NOT DETECTED Final   Staphylococcus epidermidis DETECTED (A) NOT DETECTED Final    Comment: Methicillin (oxacillin) resistant coagulase negative staphylococcus. Possible blood culture contaminant (unless isolated from more than one blood culture draw or clinical case suggests pathogenicity). No antibiotic treatment is indicated for blood  culture contaminants. CRITICAL RESULT CALLED TO, READ BACK BY AND VERIFIED WITH: PHARMD JEREMY F [id number] FCP    Staphylococcus lugdunensis NOT DETECTED NOT DETECTED Final   Streptococcus species NOT DETECTED NOT DETECTED Final   Streptococcus agalactiae NOT DETECTED NOT DETECTED Final   Streptococcus pneumoniae NOT DETECTED NOT DETECTED Final   Streptococcus pyogenes NOT DETECTED NOT DETECTED Final   A.calcoaceticus-baumannii NOT DETECTED NOT DETECTED Final   Bacteroides fragilis NOT DETECTED NOT DETECTED Final   Enterobacterales NOT DETECTED NOT DETECTED Final   Enterobacter cloacae complex NOT DETECTED NOT  DETECTED Final   Escherichia coli NOT DETECTED NOT DETECTED Final   Klebsiella aerogenes NOT DETECTED NOT DETECTED Final   Klebsiella oxytoca NOT DETECTED NOT DETECTED Final   Klebsiella pneumoniae NOT DETECTED NOT DETECTED Final   Proteus species NOT DETECTED NOT DETECTED Final   Salmonella species NOT DETECTED NOT DETECTED Final   Serratia marcescens NOT DETECTED NOT DETECTED Final   Haemophilus influenzae NOT DETECTED NOT DETECTED Final   Neisseria meningitidis NOT DETECTED NOT DETECTED Final   Pseudomonas aeruginosa NOT DETECTED NOT DETECTED Final   Stenotrophomonas maltophilia NOT DETECTED NOT DETECTED Final   Candida albicans NOT DETECTED NOT DETECTED Final   Candida auris NOT DETECTED NOT DETECTED Final   Candida glabrata NOT DETECTED NOT DETECTED Final   Candida krusei NOT DETECTED NOT DETECTED Final   Candida parapsilosis NOT DETECTED NOT DETECTED Final   Candida tropicalis NOT DETECTED NOT DETECTED Final   Cryptococcus neoformans/gattii NOT DETECTED NOT DETECTED Final   Methicillin resistance mecA/C DETECTED (A) NOT DETECTED Final    Comment: CRITICAL RESULT CALLED TO, READ BACK BY AND VERIFIED WITH: MAYA VENETIA FALCON 1435 [id number] FCP Performed at [facility], [address] [place], [place] [address]   Urine Culture     Status: Abnormal   Collection Time: 05/16/24  9:51 PM   Specimen: Urine, Random  Result Value Ref Range Status   Specimen Description URINE, RANDOM  Final   Special Requests   Final    NONE Reflexed from [id number] Performed at [facility], [address] [place], [place] [address]    Culture >=100,000 COLONIES/mL KLEBSIELLA PNEUMONIAE (A)  Final   Report Status 05/19/2024 FINAL  Final   Organism ID, Bacteria KLEBSIELLA PNEUMONIAE (A)  Final      Susceptibility   Klebsiella pneumoniae - MIC*    AMPICILLIN RESISTANT Resistant     CEFAZOLIN (URINE) Value in next row Sensitive      2 SENSITIVEThis is a modified FDA-approved test that has been  validated and its performance characteristics determined by the reporting laboratory.  This laboratory is certified under the Clinical Laboratory Improvement Amendments CLIA as qualified to perform high complexity clinical laboratory testing.    CEFEPIME Value in next row Sensitive      2 SENSITIVEThis is a modified FDA-approved test that has been validated and its performance characteristics determined by the reporting laboratory.  This laboratory is certified under the Clinical Laboratory Improvement Amendments CLIA as qualified to perform high complexity clinical laboratory testing.    ERTAPENEM Value in next row Sensitive      2 SENSITIVEThis is a modified  FDA-approved test that has been validated and its performance characteristics determined by the reporting laboratory.  This laboratory is certified under the Clinical Laboratory Improvement Amendments CLIA as qualified to perform high complexity clinical laboratory testing.    CEFTRIAXONE  Value in next row Sensitive      2 SENSITIVEThis is a modified FDA-approved test that has been validated and its performance characteristics determined by the reporting laboratory.  This laboratory is certified under the Clinical Laboratory Improvement Amendments CLIA as qualified to perform high complexity clinical laboratory testing.    CIPROFLOXACIN  Value in next row Sensitive      2 SENSITIVEThis is a modified FDA-approved test that has been validated and its performance characteristics determined by the reporting laboratory.  This laboratory is certified under the Clinical Laboratory Improvement Amendments CLIA as qualified to perform high complexity clinical laboratory testing.    GENTAMICIN Value in next row Sensitive      2 SENSITIVEThis is a modified FDA-approved test that has been validated and its performance characteristics determined by the reporting laboratory.  This laboratory is certified under the Clinical Laboratory Improvement Amendments CLIA as  qualified to perform high complexity clinical laboratory testing.    NITROFURANTOIN  Value in next row Intermediate      2 SENSITIVEThis is a modified FDA-approved test that has been validated and its performance characteristics determined by the reporting laboratory.  This laboratory is certified under the Clinical Laboratory Improvement Amendments CLIA as qualified to perform high complexity clinical laboratory testing.    TRIMETH/SULFA Value in next row Sensitive      2 SENSITIVEThis is a modified FDA-approved test that has been validated and its performance characteristics determined by the reporting laboratory.  This laboratory is certified under the Clinical Laboratory Improvement Amendments CLIA as qualified to perform high complexity clinical laboratory testing.    AMPICILLIN/SULBACTAM Value in next row Sensitive      2 SENSITIVEThis is a modified FDA-approved test that has been validated and its performance characteristics determined by the reporting laboratory.  This laboratory is certified under the Clinical Laboratory Improvement Amendments CLIA as qualified to perform high complexity clinical laboratory testing.    PIP/TAZO Value in next row Sensitive      8 SENSITIVEThis is a modified FDA-approved test that has been validated and its performance characteristics determined by the reporting laboratory.  This laboratory is certified under the Clinical Laboratory Improvement Amendments CLIA as qualified to perform high complexity clinical laboratory testing.    MEROPENEM Value in next row Sensitive      8 SENSITIVEThis is a modified FDA-approved test that has been validated and its performance characteristics determined by the reporting laboratory.  This laboratory is certified under the Clinical Laboratory Improvement Amendments CLIA as qualified to perform high complexity clinical laboratory testing.    * >=100,000 COLONIES/mL KLEBSIELLA PNEUMONIAE     Time coordinating discharge: Over 30  minutes  SIGNED:   Elsie JAYSON [place], DO [facility] 05/23/2024, 12:04 PM Pager   If 7PM-7AM, please contact night-coverage [email or web address]     [1]  Allergies Allergen Reactions   Penicillins Hives and Swelling   Tramadol  Nausea And Vomiting    Intense nausea   Vicodin [Hydrocodone -Acetaminophen ] Nausea And Vomiting   Codeine Nausea Only    Other reaction(s): Unknown   Iodine-131 Hives   Iohexol Hives    Pt developed itching and hives along with nasal congestion; needs 13 hour premeds for future studies, Onset Date: [id number]    Lisinopril Cough  Other reaction(s): Unknown   Sulfa Antibiotics Nausea And Vomiting

## 2024-09-16 ENCOUNTER — Institutional Professional Consult (permissible substitution): Admitting: Diagnostic Neuroimaging

## 2024-12-10 ENCOUNTER — Ambulatory Visit: Admitting: Family Medicine
